# Patient Record
Sex: Female | Born: 1950
Health system: Southern US, Community
[De-identification: ages and names within clinical notes are randomized; demographics above are authoritative.]

## PROBLEM LIST (undated history)

## (undated) DIAGNOSIS — C801 Malignant (primary) neoplasm, unspecified: Secondary | ICD-10-CM

## (undated) DIAGNOSIS — I2510 Atherosclerotic heart disease of native coronary artery without angina pectoris: Secondary | ICD-10-CM

## (undated) DIAGNOSIS — Z9889 Other specified postprocedural states: Secondary | ICD-10-CM

## (undated) DIAGNOSIS — T7840XA Allergy, unspecified, initial encounter: Secondary | ICD-10-CM

## (undated) DIAGNOSIS — R112 Nausea with vomiting, unspecified: Secondary | ICD-10-CM

## (undated) DIAGNOSIS — Z9221 Personal history of antineoplastic chemotherapy: Secondary | ICD-10-CM

## (undated) DIAGNOSIS — I10 Essential (primary) hypertension: Secondary | ICD-10-CM

## (undated) DIAGNOSIS — R569 Unspecified convulsions: Secondary | ICD-10-CM

## (undated) DIAGNOSIS — Z923 Personal history of irradiation: Secondary | ICD-10-CM

## (undated) DIAGNOSIS — E039 Hypothyroidism, unspecified: Secondary | ICD-10-CM

## (undated) DIAGNOSIS — E785 Hyperlipidemia, unspecified: Secondary | ICD-10-CM

## (undated) DIAGNOSIS — C50919 Malignant neoplasm of unspecified site of unspecified female breast: Secondary | ICD-10-CM

## (undated) DIAGNOSIS — I219 Acute myocardial infarction, unspecified: Secondary | ICD-10-CM

## (undated) HISTORY — PX: MASTECTOMY: SHX3

## (undated) HISTORY — DX: Malignant neoplasm of unspecified site of unspecified female breast: C50.919

## (undated) HISTORY — DX: Acute myocardial infarction, unspecified: I21.9

## (undated) HISTORY — DX: Atherosclerotic heart disease of native coronary artery without angina pectoris: I25.10

## (undated) HISTORY — DX: Personal history of irradiation: Z92.3

## (undated) HISTORY — DX: Essential (primary) hypertension: I10

## (undated) HISTORY — DX: Hyperlipidemia, unspecified: E78.5

---
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
## 1996-10-17 HISTORY — PX: BREAST SURGERY: SHX581

## 1996-10-17 HISTORY — PX: ABDOMINAL HYSTERECTOMY: SHX81

## 1997-05-09 HISTORY — PX: OTHER SURGICAL HISTORY: SHX169

## 1999-01-18 ENCOUNTER — Encounter: Payer: Self-pay | Admitting: Obstetrics and Gynecology

## 1999-01-19 ENCOUNTER — Ambulatory Visit (HOSPITAL_COMMUNITY): Admission: RE | Admit: 1999-01-19 | Discharge: 1999-01-19 | Payer: Self-pay | Admitting: Obstetrics and Gynecology

## 1999-06-29 ENCOUNTER — Inpatient Hospital Stay (HOSPITAL_COMMUNITY): Admission: RE | Admit: 1999-06-29 | Discharge: 1999-07-01 | Payer: Self-pay | Admitting: Gynecology

## 1999-10-18 DIAGNOSIS — I2510 Atherosclerotic heart disease of native coronary artery without angina pectoris: Secondary | ICD-10-CM

## 1999-10-18 HISTORY — DX: Atherosclerotic heart disease of native coronary artery without angina pectoris: I25.10

## 2000-06-22 ENCOUNTER — Encounter: Payer: Self-pay | Admitting: Gynecology

## 2000-06-22 ENCOUNTER — Ambulatory Visit (HOSPITAL_COMMUNITY): Admission: RE | Admit: 2000-06-22 | Discharge: 2000-06-22 | Payer: Self-pay | Admitting: Gynecology

## 2000-07-14 ENCOUNTER — Encounter: Admission: RE | Admit: 2000-07-14 | Discharge: 2000-07-14 | Payer: Self-pay | Admitting: Family Medicine

## 2000-07-14 ENCOUNTER — Encounter: Payer: Self-pay | Admitting: Family Medicine

## 2000-09-16 DIAGNOSIS — I219 Acute myocardial infarction, unspecified: Secondary | ICD-10-CM

## 2000-09-16 HISTORY — DX: Acute myocardial infarction, unspecified: I21.9

## 2000-09-26 ENCOUNTER — Inpatient Hospital Stay (HOSPITAL_COMMUNITY): Admission: EM | Admit: 2000-09-26 | Discharge: 2000-09-29 | Payer: Self-pay | Admitting: Cardiology

## 2000-12-15 ENCOUNTER — Inpatient Hospital Stay (HOSPITAL_COMMUNITY): Admission: AD | Admit: 2000-12-15 | Discharge: 2000-12-16 | Payer: Self-pay | Admitting: Internal Medicine

## 2001-06-28 ENCOUNTER — Encounter: Payer: Self-pay | Admitting: Gynecology

## 2001-06-28 ENCOUNTER — Ambulatory Visit (HOSPITAL_COMMUNITY): Admission: RE | Admit: 2001-06-28 | Discharge: 2001-06-28 | Payer: Self-pay | Admitting: Gynecology

## 2001-07-12 ENCOUNTER — Other Ambulatory Visit: Admission: RE | Admit: 2001-07-12 | Discharge: 2001-07-12 | Payer: Self-pay | Admitting: Gynecology

## 2001-07-19 ENCOUNTER — Encounter: Payer: Self-pay | Admitting: Endocrinology

## 2001-07-19 ENCOUNTER — Ambulatory Visit (HOSPITAL_COMMUNITY): Admission: RE | Admit: 2001-07-19 | Discharge: 2001-07-19 | Payer: Self-pay | Admitting: Endocrinology

## 2002-01-24 ENCOUNTER — Encounter: Payer: Self-pay | Admitting: Emergency Medicine

## 2002-01-24 ENCOUNTER — Inpatient Hospital Stay (HOSPITAL_COMMUNITY): Admission: EM | Admit: 2002-01-24 | Discharge: 2002-01-25 | Payer: Self-pay | Admitting: Emergency Medicine

## 2002-07-10 ENCOUNTER — Encounter: Payer: Self-pay | Admitting: Gynecology

## 2002-07-10 ENCOUNTER — Ambulatory Visit (HOSPITAL_COMMUNITY): Admission: RE | Admit: 2002-07-10 | Discharge: 2002-07-10 | Payer: Self-pay | Admitting: Gynecology

## 2002-07-24 ENCOUNTER — Other Ambulatory Visit: Admission: RE | Admit: 2002-07-24 | Discharge: 2002-07-24 | Payer: Self-pay | Admitting: Gynecology

## 2003-07-14 ENCOUNTER — Encounter: Payer: Self-pay | Admitting: Gynecology

## 2003-07-14 ENCOUNTER — Ambulatory Visit (HOSPITAL_COMMUNITY): Admission: RE | Admit: 2003-07-14 | Discharge: 2003-07-14 | Payer: Self-pay | Admitting: Gynecology

## 2003-08-08 ENCOUNTER — Other Ambulatory Visit: Admission: RE | Admit: 2003-08-08 | Discharge: 2003-08-08 | Payer: Self-pay | Admitting: Gynecology

## 2003-12-21 ENCOUNTER — Inpatient Hospital Stay (HOSPITAL_COMMUNITY): Admission: EM | Admit: 2003-12-21 | Discharge: 2003-12-21 | Payer: Self-pay | Admitting: Emergency Medicine

## 2004-02-23 ENCOUNTER — Ambulatory Visit (HOSPITAL_COMMUNITY): Admission: RE | Admit: 2004-02-23 | Discharge: 2004-02-23 | Payer: Self-pay | Admitting: Unknown Physician Specialty

## 2004-07-14 ENCOUNTER — Ambulatory Visit (HOSPITAL_COMMUNITY): Admission: RE | Admit: 2004-07-14 | Discharge: 2004-07-14 | Payer: Self-pay | Admitting: Gynecology

## 2004-07-14 ENCOUNTER — Other Ambulatory Visit: Admission: RE | Admit: 2004-07-14 | Discharge: 2004-07-14 | Payer: Self-pay | Admitting: Gynecology

## 2004-08-17 ENCOUNTER — Ambulatory Visit: Payer: Self-pay | Admitting: Family Medicine

## 2005-03-07 ENCOUNTER — Ambulatory Visit: Payer: Self-pay | Admitting: Family Medicine

## 2005-08-09 ENCOUNTER — Ambulatory Visit: Payer: Self-pay | Admitting: Family Medicine

## 2005-09-15 ENCOUNTER — Ambulatory Visit (HOSPITAL_COMMUNITY): Admission: RE | Admit: 2005-09-15 | Discharge: 2005-09-15 | Payer: Self-pay | Admitting: Gynecology

## 2005-09-20 ENCOUNTER — Other Ambulatory Visit: Admission: RE | Admit: 2005-09-20 | Discharge: 2005-09-20 | Payer: Self-pay | Admitting: Gynecology

## 2006-09-21 ENCOUNTER — Ambulatory Visit (HOSPITAL_COMMUNITY): Admission: RE | Admit: 2006-09-21 | Discharge: 2006-09-21 | Payer: Self-pay | Admitting: Gynecology

## 2006-12-07 ENCOUNTER — Ambulatory Visit: Payer: Self-pay | Admitting: Family Medicine

## 2007-03-27 ENCOUNTER — Ambulatory Visit: Payer: Self-pay | Admitting: Family Medicine

## 2007-05-17 ENCOUNTER — Ambulatory Visit: Payer: Self-pay | Admitting: Gastroenterology

## 2007-05-31 ENCOUNTER — Encounter: Payer: Self-pay | Admitting: Gastroenterology

## 2007-05-31 ENCOUNTER — Ambulatory Visit: Payer: Self-pay | Admitting: Gastroenterology

## 2009-06-12 DIAGNOSIS — E039 Hypothyroidism, unspecified: Secondary | ICD-10-CM | POA: Insufficient documentation

## 2009-06-12 DIAGNOSIS — I10 Essential (primary) hypertension: Secondary | ICD-10-CM | POA: Insufficient documentation

## 2009-06-12 DIAGNOSIS — I2510 Atherosclerotic heart disease of native coronary artery without angina pectoris: Secondary | ICD-10-CM | POA: Insufficient documentation

## 2009-06-12 DIAGNOSIS — E785 Hyperlipidemia, unspecified: Secondary | ICD-10-CM | POA: Insufficient documentation

## 2009-06-12 DIAGNOSIS — R42 Dizziness and giddiness: Secondary | ICD-10-CM | POA: Insufficient documentation

## 2009-06-12 DIAGNOSIS — R002 Palpitations: Secondary | ICD-10-CM | POA: Insufficient documentation

## 2009-06-12 DIAGNOSIS — E782 Mixed hyperlipidemia: Secondary | ICD-10-CM | POA: Insufficient documentation

## 2011-03-04 NOTE — Discharge Summary (Signed)
[place]. [facility]  Patient:    [name] Riley, [name] Riley                      MRN: [id number] Adm. Date:  12/15/00 Disc. Date: 12/17/99 Attending:  Lewayne Bunting, M.D. Dictator:   Rozell Searing, P.A. CC:         Elvina Sidle, M.D.  [facility]   Discharge Summary  REASON FOR ADMISSION:  Please refer to dictated admission note.  LABORATORY DATA:  Cardiac enzymes show CPK-MB negative x 2.  Troponin I 0.01 x 2.  Normal metabolic panel.  Lipid profile shows cholesterol 145, triglycerides 85, HDL 36, LDL 92.  HOSPITAL COURSE:  Following transfer from [facility] where the patient presented with chest discomfort, she ruled out for MI with negative serial cardiac enzymes.  Of note, Dr. Lewayne Bunting, during his review of the case in [place], [place], had recommended a review of a 12-lead EKG by Dr. Nathen May regarding the question of possible brugada syndrome.  He has also recommended proceeding with an exercise stress test if the patient ruled out for a myocardial infarction.  Following overnight observation and negative serial cardiac enzymes, the patient was clear for discharge by Dr. Rollene Rotunda with whom the patient is scheduled to follow in late March.  Dr. Rollene Rotunda noted that he would arrange for an outpatient stress Cardiolite test.  DISCHARGE MEDICATIONS: 1. Altace 5 mg q.d. 2. Zocor 10 mg q.h.s. 3. Toprol XL 100 mg q.d. 4. Synthroid 0.1 mg q.d. 5. Ecotrin 325 mg q.d.  DISCHARGE INSTRUCTIONS:  The patient is scheduled to return to Dr. Rollene Rotunda later this month as previously scheduled.  Arrangements will be made for a outpatient stress test.  DISCHARGE DIAGNOSES: 1. Nonischemic chest pain.    a. Negative serial cardiac enzymes. 2. Coronary artery disease.    a. Status post myocardial infarction/ventricular fibrillation cardiac       arrest December 2001.    b. Preserved left ventricular function. 3.  Hypertension. 4. Dyslipidemia. 5. Treated hypothyroidism. 6. Hypokalemia. DD:  01/04/01 TD:  01/04/01 Job: [id number]

## 2011-03-04 NOTE — Cardiovascular Report (Signed)
[place]. [facility]  Patient:    [name] Riley, [name] Riley                       MRN: [id number] Proc. Date: 09/26/00 Adm. Date:  [id number] Attending:  Nelta Numbers CC:         Elvina Sidle, M.D.   Cardiac Catheterization  DATE OF BIRTH: 10-08-1951.  PROCEDURE:  Left heart catheterization/coronary arteriography.  INDICATIONS:  Evaluate patient with a non-Q-wave myocardial infarction.  PROCEDURE NOTE:  Left heart catheterization was performed via the right femoral artery.  The artery was cannulated using an anterior wall puncture. A 6-French arterial sheath was inserted via the modified Seldinger technique. Preformed Judkins and a pigtail catheter were utilized.  The patient tolerated the procedure well and left the lab in stable condition.  RESULTS:  HEMODYNAMICS:  LV 127/10, aortic pressure 127/70.  CORONARIES:  LEFT MAIN: The left main was normal.  LEFT ANTERIOR DESCENDING:  The LAD was normal in its proximal segment. However, in the mid segment there was a dissection plane with subsequent occlusion of the distal and apical LAD.  CIRCUMFLEX:  The circumflex was normal.  RIGHT CORONARY ARTERY:  The right coronary artery was a dominant normal vessel.  LEFT VENTRICULOGRAM:  The left ventriculogram was obtained in the RAO projection and the LAO projection.  The ejection fraction was approximately 55% with apical akinesis.  CONCLUSIONS:  Severe single vessel coronary artery disease with mid left anterior descending dissection.  PLAN:  We will discuss possibility of percutaneous revascularization of the LAD versus medical management. DD:  09/26/00 TD:  09/26/00 Job: [id number]

## 2011-03-04 NOTE — H&P (Signed)
NAME:  [name] Riley, [name] Riley                          ACCOUNT NO.:  [id number]   MEDICAL RECORD NO.:  [id number]                   PATIENT TYPE:  INP   LOCATION:  1830                                 FACILITY:  [facility]   PHYSICIAN:  Olga Millers, M.D.                DATE OF BIRTH:  [birth year]   DATE OF ADMISSION:  12/22/2003  DATE OF DISCHARGE:  12/21/2003                                HISTORY & PHYSICAL   PRIMARY CARDIOLOGIST:  Rollene Rotunda, M.D.   PRIMARY CARE PHYSICIAN:  Colon Flattery, D.O.   CHIEF COMPLAINT:  Chest pain, atypical.   HISTORY OF PRESENT ILLNESS:  A 60 year old female with a history of  hypertension, hyperlipidemia, coronary artery disease, status post mid LAD  dissection, and apical myocardial infarction with V-fib arrest (December  2001), with subsequent cardiac catheterization revealing a normal left  circumflex, normal left main, normal dominant right coronary artery, but a  mid LAD dissection with 100% occlusion of the vessel after the dissection  seen on catheterization.  Left ventriculogram with an ejection fraction of  55% and apical akinesis managed medically.  The patient had subsequently had  yearly presentations with atypical chest pain and subsequent exclusion of  myocardial infarction and subsequent negative Cardiolite stress test was  recent done in April of 2003 (Dr. Jens Som).  The patient also notes that in  addition to these two presentations (April 2003 and March, 2002).  The  patient also presented a couple of months ago to an outside hospital with  atypical chest pain that was a attributed to anxiety.  She had blood tests  done, but no stress test, and was subsequently sent home.  The patient also  notes that she has had a recent increase in her Pravachol to 40 mg p.o.  q.h.s. with improvement in her LDL to 77; however, LFT's are slightly up  making the patient more anxious than usual.  The patient's symptom that  brought her to the hospital today  was primary left-arm pain.  The arm pain  was present in the region of the humerus.  There were no aggravating or  relieving factors.  The pain would be transient 5/10 in severity, lasting  for minutes and seconds, occurring three to four times per day over the past  two days.  There were no accompanying symptoms of chest pain, shortness of  breath, nausea, vomiting or diaphoresis.  The patient has no pain currently.  The patient's husband added that anxiety is often a contributing factor to  the patient's atypical symptoms and thinks that it might be helpful if they  could talk to someone about possible anxiety treatment.  The patient has no  other complaints at this time.   PAST MEDICAL HISTORY:  1. IV contrast allergy.  2. Coronary artery disease, status post apical myocardial infarction with __     arrest December, 2001, with cardiac catheterization revealing a  mid LAD     dissection and subsequent 100% occlusion, left circumflex was normal.     The left main was normal.  Dominant right coronary artery was normal.     Left ventriculogram revealed an ejection fraction of 55% with apical     akinesis.  This lesions was managed medically.  3. Hypertension.  4. Hyperlipidemia with a history of Zocor, having elevated LFT's.  LFT's     normal on Pravachol 20 mg p.o. daily.  However, when Pravachol is     increased to 40 mg p.o. daily, there was a slight elevation in LFT's.  5. Hypothyroidism.  Maintained on Synthroid 75 micrograms p.o. daily.  6. History of palpitations, dizziness, anxiety, atypical chest pain, status     post exclusion of myocardial infarction and negative Cardiolite in March     of 2002, status post exclusion of myocardial infarction and negative     Cardiolite in April of 2003.  7. Status post hysterectomy, appendectomy and tonsillectomy.   SOCIAL HISTORY:  The patient lives in [place] with her husband.  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  The patient does own a treadmill, but does not  exercise on it regularly.  She does remain active at work and with yard work  as well as housework, but no formal exercise regimen.  She denies any  alcohol, tobacco or drug use in the past.   FAMILY HISTORY:  The patient's mother is alive at age 59 with chronic  cystitis.  The patient's father died at age 45 of lung cancer after heavy  tobacco use.  The patient has two brothers who are alive and well.  The  patient had three children in their 20's who are all healthy.   ALLERGIES/ADVERSE REACTIONS:  1. ZOCOR: Increased LFT's.  2. IV CONTRAST:  Caused itching, flushing and nausea.   MEDICATIONS:  1. Aspirin 81 mg p.o. daily.  2. Toprol XL 100 mg p.o. daily.  3. Altace 10 mg p.o. daily.  4. Pravachol 40 mg p.o. daily.  5. Synthroid 75 micrograms p.o. daily.  6. Multivitamin.  7. Vivelle 0.05 estrogen replacement patch, titrated down to lowest     tolerable dose with the assistance of her gynecologist to prevent hot     flashes.   PHYSICAL EXAMINATION:  VITAL SIGNS:  Temperature 98.4, heart rate 67,  respiratory rate 20, blood pressure 130/81, oxygen saturation 98% on room  air.  GENERAL:  The patient is pleasant, alert and cooperative.  She answers  questions appropriately.  HEENT:  She is normocephalic and atraumatic.  Her extraocular eye movements  are intact.  Her pupils are equal, round and reactive to light.  NECK:  Supple.  Carotid pulses are 2+ and symmetric bilaterally.  There is  no carotid bruits.  There is no jugular venous distension.  CARDIAC:  Exam reveals a regular S1 and S2.  There is no murmur.  LUNGS:  Fields are clear bilaterally.  SKIN:  Examination reveals no acute rash.  ABDOMEN:  Soft, nontender, nondistended with positive bowel sounds.  There  is no palpable hepatosplenomegaly. EXTREMITIES:  Examination reveals 2+ femoral pulses with no evidence of  bruits.   There is no lower-extremity edema.  NEUROLOGIC:  Brief neurological exam is grossly nonfocal.  There patient is  able to move all four extremities without difficulty.  Mentation is grossly  intact.   STUDIES:  EKG:  Normal sinus rhythm with a rate of 63, and no ischemic  changes, normal axis.  No evidence of hypertrophy.   LABORATORY DATA:  Hematocrit 38, sodium 141, potassium 3.5, chloride 104,  bicarbonate 27, BUN 15, glucose 122, CK-MB 1.1, less than 1.0.  Troponin I  less than 0.05 x2.  Myoglobin 35, 23.  The pH 7.39, pcO2 45.   ASSESSMENT/PLAN:  1. History of apical myocardial infarction secondary to mid left anterior     descending dissection, other coronaries normal with anxiety.  Atypical     chest pain, status post exclusion of myocardial infarction and negative     Cardiolite in 2002 and 2003.  We will exclude myocardial infarction with     serial cardiac markers on telemetry and serial EKG's.  We will make     arrangements for risk stratification with exercise stress test.  (For     example, treadmill stress echocardiogram).  We will continue aspirin 81     mg p.o. daily, Toprol XL 3 mg p.o. daily, Altace 10 mg p.o. daily, and     Pravachol 40 mg p.o. q.h.s. for history  of myocardial infarction.  2. Hyperlipidemia.  We will check lipid profile and liver function tests in     the morning.  If LFT's are significant elevated, continue reduction of     Pravachol back to 20 mg p.o. q.h.s. which was tolerated well with serial     liver function testing (per patient).  3. Hypertension.  Continue beta-blocker as described above.  Obtain goal     blood pressure of less than 135/85.  4. Hypothyroidism.  We will check TSH and free T4 to assure a euthyroid     state on the current dose of Synthroid.  5. Status post hysterectomy, on hormone replacement therapy.  The patient     reports that she had intolerable hot flashes accompanied by transient     episodes of hypertension when she  was not taking her estrogen patch.  She     reports that she had titrated the estrogen patch down to the lowest     tolerable dose with the recommendation of her gynecologist, to prevent     hot flashes, and transient episodes of hypertension.  6. Anxiety.  The patient and her husband expressed concern regarding anxiety     as a contributing factor to this and previous presentations with atypical     chest pain.  The patient reports being in a high-stress job.  She and her     husband express interest in a possible referral to talk to somebody about     treatment for anxiety.      Verne Grain, MD                      Olga Millers, M.D.    DDH/MEDQ  D:  12/21/2003  T:  12/21/2003  Job:  [id number]

## 2011-03-04 NOTE — Discharge Summary (Signed)
NAME:  CARLISHA, WISLER                          ACCOUNT NO.:  [id number]   MEDICAL RECORD NO.:  [id number]                   PATIENT TYPE:  INP   LOCATION:  1830                                 FACILITY:  [facility]   PHYSICIAN:  Chloe Miyoshi Dictator                    DATE OF BIRTH:  1951/09/19   DATE OF ADMISSION:  12/20/2003  DATE OF DISCHARGE:  12/21/2003                                 DISCHARGE SUMMARY   PRIMARY CAREGIVER:  Colon Flattery, D.O.   DISCHARGE DIAGNOSES:  1. Admitted on December 21, 2003, with left arm pain, a scale of 5/10, lasting     seconds to minutes three to four time for the last two days.  2. Admission troponin I studies negative x 3.  3. Electrocardiogram, nondiagnostic.   The bulk of the dictation will follow.      Maple Mirza, P.A.                    Kehinde Totzke Dictator    GM/MEDQ  D:  01/28/2004  T:  01/29/2004  Job:  [id number]   cc:   Rollene Rotunda, M.D.   Colon Flattery, D.O.  [address]  [place]  [name] [address]  Fax: [phone]

## 2011-03-04 NOTE — Discharge Summary (Signed)
[place]. [facility]  Patient:    [name] Riley, [name] Riley                       MRN: [id number] Adm. Date:  [id number] Disc. Date: 09/29/00 Attending:  Nelta Numbers Dictator:   Tereso Newcomer, P.A. CC:         Gretta Cool, M.D.  Elvina Sidle, M.D.   Discharge Summary  DATE OF BIRTH:  [birth year]  REASON FOR ADMISSION:  Apical myocardial infarction, status post sudden cardiac death/ventricular arrest in the field, revived with automated external defibrillator at local fire station.  DISCHARGE DIAGNOSES: 1. Single vessel coronary artery disease. 2. Minor apical wall motion abnormalities. 3. Left ventricular ejection fraction 55% by cardiac catheterization on    September 26, 2000. 4. History of IV dye allergy. 5. Hyperlipidemia. 6. Hypertension. 7. Status post hysterectomy.  PROCEDURES PERFORMED:  Cardiac catheterization on 09/26/00 revealing left main normal, LAD mid dissection with distal occlusion, circumflex normal, RCA normal and dominant, LVEF 55%, apical akinesis.  A 2-D echocardiogram performed on September 27, 2000, revealed LVEF of 60%, LV wall thickness mildly increased, trivial aortic valve regurgitation.  Echocardiogram reviewed by Dr. Antoine Poche with conclusion being there was only minor apical wall motion abnormalities.  HISTORY OF PRESENT ILLNESS:  This 60 year old female presented with ischemic chest pain and ventricular fibrillation arrest.  She was driven to her local fire station at the time of arrest.  She underwent fairly prompt defibrillation with AED.  Initially, she was combative.  Upon initial evaluation in the emergency room she was back to baseline.  The onset of her anterior chest pain was just prior to the ventricular fibrillation arrest. The pain that radiated to her left arm was associated with lightheadedness. She had similar symptoms a few days prior that resolved spontaneously.  She noted intermittent  palpitations in the past.  ALLERGIES:  IV dye.  PHYSICAL EXAMINATION:  GENERAL:  Initial physical exam revealed a pleasant, youthful, middle age female.  VITAL SIGNS:  Heart rate 100 and irregular.  Blood pressure 106/60. Respirations 20.  NECK:  Without bruits.  LUNGS:  There are a few coarse basilar rales.  CARDIAC:  Normal S1 and S2.  ABDOMEN:  Positive bowel sounds, soft, nontender.  EXTREMITIES:  Normal distal pulses.  NEUROLOGICAL:  Nonfocal.  LABORATORY DATA:  Electrocardiogram #1: Atrial fibrillation with rapid ventricular response, normal QTC, hyperacute anterior T-waves.  EKG #2 atrial fibrillation, controlled ventricular response, slight anterior ST elevation with T-wave inversions.  AED printout revealed pads on at 8:27 p.m., ventricular fibrillation defibrillated at 24S or 200 joules resulting in sinus bradycardia, intraventricular conduction delay and then sinus tachycardia at 2:1 AV block, then PVCs, then on _____ and then ventricular fibrillation at 40 seconds after shock.  The second shock was 90 seconds later at 200 joules, arrested briefly.  Ventricular fibrillation #3 shocked with 200 joules ineffective, 300 joules sinus rhythm and and then atrial fibrillation.  HOSPITAL COURSE:  The patient was admitted for acute myocardial infarction with ventricular fibrillation arrest.  Her cardiac enzymes were positive for MI.  Total CK#1 404, CK-MB 14.3, total CK #2 2932, CK-MB #2 15.6.  Troponin is 3.47.  The patients beta blocker was continued.  She was placed on heparin. She was taken for cardiac catheterization on September 26, 2000.  The results are noted above.  The procedure was performed by Dr. Rollene Rotunda.  The case was reviewed by Dr. Riley Kill.  Her apical MI and ventricular fibrillation were thought to be secondary to her LAD dissection.  Medical management was recommended.  She was been continued on beta blocker and aspirin.  Given her apical MI and  apical akinesis, she was originally started on Coumadin. Echocardiogram was checked, and it was noted that the apical wall motion abnormality was actually minor.  Therefore, no Coumadin therapy was warranted. This was discontinued prior to discharge.  The patient was noted to have an abnormal urinalysis with small hemoglobin, small leukocyte esterase, greater than 20 white cells, too numerous to count red cells and few bacteria.  Therefore, she was begun on Bactrim and Pyridium on September 28, 2000.  On admission, her potassium was noted to be borderline low at 2.7 and magnesium was 2.3.  She has urine, sodium and potassium performed.  Urine sodium was 101 and urine potassium was 69.  This was done because hypoaldosteronism needed to be considered.  There was some question upon initial presentation as to whether or not potassium was possibly a cause for her arrest.  Serum aldosterone level is still pending at the time of this dictation.  The patient was noted to have elevated cholesterol levels.  Total cholesterol was 206, LDL was 145, triglycerides 64, HDL 48.  Therefore, she was begun on Zocor prior to discharge.  She will need follow-up LFTs and lipid panel in six weeks.  On September 29, 2000, she was found to be in stable condition and ready for discharge to home.  Other labs:  White count 9000, hemoglobin 12.4 and hematocrit 34.2.  Platelet count 288,000.  INR 1.7.  Sodium 137, potassium 3.8, chloride 104, CO2 26, glucose 116, BUN 13, creatinine 0.7.  Total bilirubin 0.6, alkaline phosphatase 93, SGOT 66, SGPT 72, total protein 6.6, albumin 3.7, calcium 8.2, magnesium 1.7.  Urine culture on September 28, 2000, was greater than 100,000 of multiple species, probably contaminant.  The patient is to complete her antibiotic therapy and follow up with a urinalysis in two weeks at her appointment.  DISCHARGE MEDICATIONS:   1. Zocor 10 mg q.h.s.  2. Pyridium 100 mg t.i.d. until  finished.  3. Bactrim DS one p.o. b.i.d. until finished.  4. Altace 5 mg q.d.  5. Toprol XL 100 mg q.d.  6. Coated aspirin 325 mg q.d.  7. Estrogen patch.  8. Synthroid 0.1 mg q.d.  9. Nitroglycerin 0.4 mg sublingual p.r.n. chest pain. 10. Testosterone  cream as directed.  ACTIVITY:  No driving, heavy lifting, exertion or sex for one week.  No work until seen by Dr. Antoine Poche on October 11, 2000.  DIET:  Low fat, low salt.  WOUND CARE:  The patient is to watch the groin site for increased swelling or bruising and call with concerns.  FOLLOW-UP:  She is to have her cholesterol and liver enzymes checked in six weeks.   She is to have a urinalysis checked at her follow-up appointment with Dr. Antoine Poche.  Follow-up is with Dr. Lindaann Slough physician assistant on October 11, 2000, at 12:30 p.m. in [place]. DD:  09/29/00 TD:  09/29/00 Job: [id number]

## 2011-03-04 NOTE — Discharge Summary (Signed)
NAME:  [name] Riley, [name] Riley                          ACCOUNT NO.:  [id number]   MEDICAL RECORD NO.:  [id number]                   PATIENT TYPE:  INP   LOCATION:  1830                                 FACILITY:  [facility]   PHYSICIAN:  Rollene Rotunda, M.D.                DATE OF BIRTH:  July 29, 1951   DATE OF ADMISSION:  12/20/2003  DATE OF DISCHARGE:  12/21/2003                                 DISCHARGE SUMMARY   CONTINUATION:   DISCHARGE DIAGNOSES:  1. Admission with left arm pain, severity 5/10, lasting seconds to minutes,     occurring 3-4 times a day the last 2 days.  2. Troponin I studies negative rule-out for myocardial infarction.  3. Electrocardiogram nondiagnostic.   SECONDARY DIAGNOSES:  1. Intravenous contrast allergy.  2. History of coronary artery disease, status post apical myocardial     infarction with cardiac arrest, December 2001.  3. Left heart catheterization reveals mid left anterior descending     dissection with subsequent 100% occlusion, normal left circumflex, normal     left main, normal dominant right coronary artery, ejection fraction of     55% with apical akinesis, medical management.  4. Hypertension.  5. Hyperlipidemia with elevated liver function tests on Zocor, now on     Pravachol.  6. Hypothyroidism.  7. History of palpitations, dizziness, anxiety and atypical chest pain.     Negative Cardiolite study, March 2002, status post exclusion of     myocardial infarction and negative Cardiolite, April 2003.  8. Status post hysterectomy, appendectomy, tonsillectomy.   PROCEDURE:  Rule out for myocardial infarction.   DISCHARGE DISPOSITION:  [name] Riley is discharged December 21, 2003 after  cycling cardiac enzymes.  She also had a fasting lipid profile done; the  study is as follows:  Cholesterol 165, triglycerides 82, HDL cholesterol 37,  LDL cholesterol 112.  TSH was 3.696, T4 was 9.5.  The patient was also  maintained on Ativan 10 mg orally every 8 hours  and prescribed Xanax as an  outpatient.  She has had no further chest pain, this hospitalization, after  admission.  She has had no cardiac dysrhythmias or respiratory compromise.  She will be scheduled for a Cardiolite study, January 23, 2004, and a followup  appointment with Dr. Rollene Rotunda scheduled after the study is finished.   DISCHARGE MEDICATIONS:  She is discharged on the following medications:  1. Aspirin 81 mg daily.  2. Toprol-XL 100 mg daily.  3. Altace 10 mg daily.  4. Pravachol 40 mg daily at bedtime.  5. Synthroid 75 mcg daily.  6. Multivitamin daily.  7. Vivelle 0.05 estrogen replacement patch at lowest tolerable dose.  8. Xanax 0.25 mg every 8 hours as needed for anxiety.  9. Nitroglycerin 0.4 mg 1 tab sublingually every 5 minutes x3 doses as     needed for chest pain.   BRIEF HISTORY:  [name] Riley  is a 60 year old female with a history of  hypertension, hyperlipidemia, coronary artery disease.  She is status post  left heart catheterization in December 2001, concurrent with cardiac arrest,  which showed mid LAD dissection and apical myocardial infarction.  Catheterization also revealed a normal left circumflex, normal left main and  normal dominant right coronary artery.  Left ventriculogram showed ejection  fraction of 55% and apical akinesis.  The patient has been managed  medically.  Subsequently, the patient has had yearly presentations with  atypical chest pain and has been subsequently ruled out for myocardial  infarctions and has had 2 negative stress Cardiolite studies done, the  first, March 2002, and then April 2003.  A couple of months ago, the patient  presented to an outside hospital with atypical chest pain (attributed to  anxiety).  She had blood tests done but no stress test and was subsequently  sent home.  The patient notes that she has had a recent increase in her  Pravachol to 40 mg at night with improvement in her LDL, however, her liver   function studies are slightly up, making the patient more anxious than  usual.  Today, the patient came to the hospital with primarily left arm  pain; it is present in the region of the humerus; there are no aggravating  or relieving factors; the pain is transient, 5/10 in severity; it lasts from  seconds to minutes; occurs 3-4 times a day over the last 48 hours.  There  are no symptoms of chest pain, shortness of breath, nausea, vomiting or  diaphoresis.  At the time of this examination, the patient is pain-free.  The patient's husband added that anxiety is often a contributing factor to  patient's atypical symptoms and thinks it might be helpful if they could  talk to someone about possible anxiety treatment.  In this last regard  concerning anxiety, the patient was referred to Dr. Onalee Hua L. Gutterman by  Dr. Antoine Poche; in addition, she is to follow with Dr. Colon Flattery and as  mentioned above, will be sent home on Xanax 0.25 mg every 8 hours as needed  for a 72-month prescription.   HOSPITAL COURSE:  After admission to [facility] through the  emergency room for atypical chest pain and having been ruled out for  myocardial infarction, the patient was seen the following morning by Dr.  Antoine Poche.  The patient was pain-free and enzymes x3 were negative.  The  patient was discharged later the same day at about 1:30 with a followup  outpatient Cardiolite study and followup also with Dr. Antoine Poche.      [name] Riley, P.A.                    Rollene Rotunda, M.D.    GM/MEDQ  D:  01/28/2004  T:  01/29/2004  Job:  [id number]

## 2011-03-04 NOTE — H&P (Signed)
[place]  Patient:    [name] Riley, [name] Riley Visit Number: [id number] MRN: [id number]          Service Type: MED Location: [phone] Attending Physician:  Rollene Rotunda Dictated by:   Madolyn Frieze. Jens Som, M.D. [facility] Admit Date:  01/24/2002                           History and Physical  HISTORY OF PRESENT ILLNESS:  The patient is a 60 year old female with a past medical history of coronary artery disease, hypertension, hyperlipidemia and hypothyroidism, who presents with palpitations and vague chest pain.  The patients cardiac history dates back to December of 2001.  At that time, she had an apical myocardial infarction complicated by ventricular fibrillation at rest.  She underwent cardiac catheterization that showed a normal left main. The mid-LAD had a dissection plane with subsequent total occlusion.  The circumflex was normal, as was the right coronary artery, which was dominant. Her ejection fraction was 55% with apical akinesis.  Of note, medical management was suggested at that time.  Since then, she has done reasonably well.  She denies any history of exertional chest pain and she does exercise routinely.  There is no dyspnea on exertion, orthopnea or PND.  The patient states that her blood pressure medications have been changed recently, however, her blood pressure has increased and she has also had episodes of palpitations.  She describes a "pounding sensation in her chest" that lasts for approximately 15 minutes.  This typically occurs when her blood pressure is elevated and she had an episode last evening.  There is associated dizziness but no frank syncope.  There is a feeling in her chest as if "she needs to belch."  There is no associated nausea, vomiting or short of breath. She had two subsequent episodes today and therefore was brought to the emergency room.  She is presently asymptomatic.  Because of these symptoms, we were asked  to further evaluation.  PAST MEDICAL HISTORY: 1. Toprol 100 mg p.o. q.d. 2. Altace 5 mg p.o. q.d. 3. Pravachol 20 mg p.o. q.d. 4. Synthroid. 5. Aspirin 81 mg p.o. q.d. 6. Vitamin E. 7. Multivitamin. 8. Sublingual nitroglycerin p.r.n.  ALLERGIES:  She is allergic to [facility] as it did increase her liver functions; she also has an allergy to IVP DYE.  SOCIAL HISTORY:  She does not smoke nor does she consume alcohol.  PAST MEDICAL HISTORY:  Her past medical history is significant for coronary artery disease as outlined above; she also has hypertension and hyperlipidemia but no diabetes mellitus.  She has a history of hypothyroidism.  She is status post hysterectomy as well as appendectomy.  She has a history of tonsillectomy.  FAMILY HISTORY:  Positive for coronary artery disease.  REVIEW OF SYSTEMS:  She denies any headaches or fever or chills.  There is no productive cough or hemoptysis.  There is no dysphagia, odynophagia, melena or hematochezia.  There is no dysuria or hematuria.  There is no rash or seizure activity.  There is no orthopnea, PND or pedal edema.  The remaining symptoms are negative.  PHYSICAL EXAMINATION:  VITAL SIGNS:  Physical exam today shows a blood pressure of 130/80 and her pulse is 78.  She is afebrile.  GENERAL:  She is well-developed and well-nourished, in no acute distress.  SKIN:  Warm and dry.  HEENT:  Unremarkable with normal eyelids.  NECK:  Supple  with normal upstrokes bilaterally and there are no bruits noted. There is no jugular venous distention nor thyromegaly noted.  CHEST:  Clear to auscultation with normal expansion.  CARDIOVASCULAR:  Regular rate and rhythm with normal S1 and S2.  There are no murmurs, rubs, or gallops noted.  ABDOMEN:  Not tender or distended.  Positive bowel sounds.  No hepatosplenomegaly and no mass appreciated.  There is no abdominal bruit.  She has 2+ femoral pulses bilaterally and no  bruits.  EXTREMITIES:  Her extremities show no edema and I can palpate no cords.  She has 2+ posterior tibial pulses bilaterally.  NEUROLOGIC:  Exam is grossly intact.  LABORATORY AND ACCESSORY DATA:  Laboratories show a sodium of 144 with a BUN and creatinine of 9 and 0.6.  Her hemoglobin and hematocrit are 13.6 and 38.8. Her white blood cell count is 5.9 with a platelet count of 269,000.  Her enzymes are negative.  Her electrocardiogram shows no acute ST changes.  DIAGNOSES: 1. Palpitations. 2. Vague atypical chest pain. 3. History of coronary artery disease, status post myocardial infarction    complicated by ventricular fibrillation arrest. 4. Hypertension. 5. Hyperlipidemia. 6. Hypothyroidism.  PLAN:  The patient presents with complaints of palpitations associated with a vague chest pain.  The chest pain is extremely atypical.  We will admit and rule out myocardial infarction with serial enzymes.  If they are negative, then she can proceed with an outpatient Cardiolite.  She is also complaining of palpitations.  We will follow her on telemetry for any type of arrhythmia. If there is no further rhythm disturbance, then we will plan an outpatient event monitor to more fully assess.  We will continue with her aspirin as well as her Toprol and Altace.  The Altace will be increased for better blood pressure control.  She will be discharged in the morning if the above is negative. Dictated by:   Madolyn Frieze. Jens Som, M.D. [facility] Attending Physician:  Rollene Rotunda DD:  01/24/02 TD:  01/25/02 Job: [id number]

## 2011-03-04 NOTE — Discharge Summary (Signed)
[place]  Patient:    [name] Riley, [name] Riley Visit Number: [id number] MRN: [id number]          Service Type: MED Location: [phone] Attending Physician:  Rollene Rotunda Dictated by:   Brita Romp, P.A.-C Admit Date:  01/24/2002 Disc. Date: 01/25/02   CC:         Colon Flattery, D.O.   Discharge Summary  DISCHARGE DIAGNOSES: 1. Hypertension. 2. Palpitations. 3. Hyperlipidemia. 4. Hypothyroidism. 5. Past history of myocardial infarction in December 2001, with ventricular    fibrillation arrest.  HOSPITAL COURSE:  [name] Riley is a 60 year old female with known coronary artery disease.  She was seen on the day prior to admission in her primary care physicians office where her diastolic blood pressure was noted to be greater than 100.  Rechecking approximately one hour later showed a normalized diastolic pressure.  That evening, she felt feelings of palpitations and very mild chest pressure.  On the day of admission, she had two episodes of palpitations as previously described.  She called EMS, and was brought to the hospital at [facility].  Blood pressure on admission was 160/108.  Note, she apparently had similar symptoms in October 2000, which she had been evaluated at [place] and was thought to have been related to anxiety.  The patient was seen and admitted by Dr. Olga Millers.  He doubted that her symptoms were actually of ischemic etiology.  However, given her history, he planned to admit her and rule out for myocardial infarction.  The following day, the patient was doing well, had no further complaints.  She was seen by Dr. Antoine Poche who noted that her cardiac enzymes were serially negative, and felt she was stable for discharge.  He also planned to schedule an outpatient stress test as well as a patient event monitor.  He felt that pheochromocytoma was not likely, but he felt that it should be kept in  the differential diagnoses.  DISCHARGE MEDICATIONS: 1. Toprol XL 100 mg q.d. 2. Altace 10 mg q.d. 3. Pravachol 20 mg q.h.s. 4. Enteric-coated aspirin 81 mg q.d. 5. Synthroid 0.100 mg q.d. 6. Vitamin E and multivitamin as previously taken.  LABORATORY DATA:  Sodium 144, potassium 3.8, chloride 106, CO2 33, BUN 9, creatinine 0.6, glucose 94.  Albumin 4.2, AST 29, ALT 41, alkaline phosphatase 87, total bilirubin 0.4.  White count 5.9, hemoglobin 13.6, hematocrit 38.8, platelets 269.  Serial cardiac enzymes were negative for myocardial infarction.  Electrocardiogram shows a normal sinus rhythm at 74.  PR interval 150, QRS 83, QTC 47, axis 48.  ACTIVITY:  The patient is to return to a normal level of activity.  DIET:  Follow a low fat, low cholesterol diet.  FOLLOWUP: 1. She is to have an exercise rest stress Cardiolite examination on January 29, 2002, at 1:30 p.m. at the [place] office in [place]. 2. She is to pick up an event monitor on the same day. 3. She is to follow up with Dr. Antoine Poche on February 13, 2002, in the [place]    office at 2:30 p.m. 4. She is to keep her scheduled appointment with Dr. Dewaine Conger. Dictated by:   Brita Romp, P.A.-C Attending Physician:  Rollene Rotunda DD:  01/25/02 TD:  01/26/02 Job: [id number]

## 2011-08-03 ENCOUNTER — Other Ambulatory Visit: Payer: Self-pay | Admitting: Gynecology

## 2011-08-03 DIAGNOSIS — N6320 Unspecified lump in the left breast, unspecified quadrant: Secondary | ICD-10-CM

## 2011-08-15 ENCOUNTER — Ambulatory Visit
Admission: RE | Admit: 2011-08-15 | Discharge: 2011-08-15 | Disposition: A | Discharge status: Home / Self Care | Payer: No Typology Code available for payment source | Source: Ambulatory Visit | Attending: Gynecology | Admitting: Gynecology

## 2011-08-15 ENCOUNTER — Other Ambulatory Visit: Payer: Self-pay | Admitting: Gynecology

## 2011-08-15 DIAGNOSIS — N6320 Unspecified lump in the left breast, unspecified quadrant: Secondary | ICD-10-CM

## 2011-08-17 ENCOUNTER — Other Ambulatory Visit: Payer: Self-pay | Admitting: Obstetrics and Gynecology

## 2011-08-17 DIAGNOSIS — N63 Unspecified lump in unspecified breast: Secondary | ICD-10-CM

## 2011-08-23 ENCOUNTER — Ambulatory Visit (INDEPENDENT_AMBULATORY_CARE_PROVIDER_SITE_OTHER): Payer: Medicaid Other | Admitting: *Deleted

## 2011-08-23 ENCOUNTER — Encounter: Payer: Self-pay | Admitting: *Deleted

## 2011-08-23 VITALS — BP 169/88 | HR 88 | Temp 98.0°F | Resp 14 | Ht 68.0 in | Wt 185.0 lb

## 2011-08-23 DIAGNOSIS — N63 Unspecified lump in unspecified breast: Secondary | ICD-10-CM

## 2011-08-23 DIAGNOSIS — Z1239 Encounter for other screening for malignant neoplasm of breast: Secondary | ICD-10-CM

## 2011-08-23 DIAGNOSIS — N6320 Unspecified lump in the left breast, unspecified quadrant: Secondary | ICD-10-CM

## 2011-08-23 NOTE — Progress Notes (Signed)
Complaints of Left Breast lump.  Pap Smear:    Pap smear not performed today due to patient refused. Lat Pap smear was around 2006-2007 by Dr. Nicholas Lose. Patient has a history of hysterectomy. No history of abnormal Pap smears per patient.  Physical exam: Breasts Breasts symmetrical. No skin abnormalities bilateral breasts. Nipple retraction bilateral breasts per patient is normal. No nipple discharge bilateral breasts. No lymphadenopathy. No lumps palpated in right breast. Lump palpated in left breast at 3 o'clock around 6 cm from the nipple. Lump measured about 3 cm in width and about 2 cm in length. No complaints of pain on palpation.         Pelvic/Bimanual No Pap smear completed today due to patient refused. Patient has a history of a hysterectomy and per patient no history of abnormal Pap smears. No results in EPIC.

## 2011-08-23 NOTE — Patient Instructions (Signed)
Taught patient how to perform BSE and gave educational materials to take home. Patient refused Pap smear today. Told patient about free cervical cancer screenings to receive a Pap smear if would like one to attend one of our free screenings. Gave patient upcoming dates. Patient is scheduled for a left breast biopsy Thursday, November 8 th at 0800. Patient aware of appointment and will be there. Let patient know will follow up with her within the next couple weeks with results. Patient verbalized understanding.

## 2011-08-25 ENCOUNTER — Ambulatory Visit
Admission: RE | Admit: 2011-08-25 | Discharge: 2011-08-25 | Disposition: A | Discharge status: Home / Self Care | Payer: No Typology Code available for payment source | Source: Ambulatory Visit | Attending: Obstetrics and Gynecology | Admitting: Obstetrics and Gynecology

## 2011-08-25 ENCOUNTER — Other Ambulatory Visit: Payer: Self-pay | Admitting: Obstetrics and Gynecology

## 2011-08-25 DIAGNOSIS — N63 Unspecified lump in unspecified breast: Secondary | ICD-10-CM

## 2011-08-25 DIAGNOSIS — C50919 Malignant neoplasm of unspecified site of unspecified female breast: Secondary | ICD-10-CM | POA: Insufficient documentation

## 2011-08-25 HISTORY — DX: Malignant neoplasm of unspecified site of unspecified female breast: C50.919

## 2011-08-26 ENCOUNTER — Other Ambulatory Visit: Payer: Self-pay | Admitting: Obstetrics and Gynecology

## 2011-08-26 ENCOUNTER — Other Ambulatory Visit: Payer: Self-pay | Admitting: Gynecology

## 2011-08-26 ENCOUNTER — Other Ambulatory Visit: Payer: No Typology Code available for payment source

## 2011-08-26 ENCOUNTER — Telehealth: Payer: Self-pay | Admitting: *Deleted

## 2011-08-26 DIAGNOSIS — C50912 Malignant neoplasm of unspecified site of left female breast: Secondary | ICD-10-CM

## 2011-08-26 NOTE — Telephone Encounter (Signed)
Patient's spouse called and left voicemail for me to call back. Called patient back. [facility] and needed follow up. Patient had questions about if [employer] covers the treatment and what it covers. Explained to patient that will work on getting her [employer] Medicaid. If patient is not eligible for [employer] Medicaid or some other type of Medicaid will refer patient to a financial counselor to help her will her bills since has no insurance. Let her know will initiate her [employer] Medicaid paperwork and Silvio Pate will get in touch with her Monday to get any missing information and set up a time for her to come sign. Patient verbalized understanding.

## 2011-08-29 ENCOUNTER — Telehealth: Payer: Self-pay | Admitting: *Deleted

## 2011-08-29 ENCOUNTER — Other Ambulatory Visit: Payer: Self-pay | Admitting: Gynecology

## 2011-08-29 NOTE — Telephone Encounter (Signed)
Spoke with patient via phone concerning B[employer]-MCD application.  States that she would rather have her husband with her when she completes the application.  Patient and husband is scheduled to come in on Wednesday Nov. 14 at 1 pm.  Patient verbalized understanding.

## 2011-08-30 ENCOUNTER — Ambulatory Visit (HOSPITAL_COMMUNITY)
Admission: RE | Admit: 2011-08-30 | Discharge: 2011-08-30 | Disposition: A | Discharge status: Home / Self Care | Payer: Medicaid Other | Source: Ambulatory Visit | Attending: Gynecology | Admitting: Gynecology

## 2011-08-30 DIAGNOSIS — N63 Unspecified lump in unspecified breast: Secondary | ICD-10-CM | POA: Insufficient documentation

## 2011-08-30 DIAGNOSIS — C50419 Malignant neoplasm of upper-outer quadrant of unspecified female breast: Secondary | ICD-10-CM | POA: Insufficient documentation

## 2011-08-30 DIAGNOSIS — C773 Secondary and unspecified malignant neoplasm of axilla and upper limb lymph nodes: Secondary | ICD-10-CM | POA: Insufficient documentation

## 2011-08-30 DIAGNOSIS — C50912 Malignant neoplasm of unspecified site of left female breast: Secondary | ICD-10-CM

## 2011-08-30 LAB — CREATININE, SERUM
Creatinine, Ser: 0.52 mg/dL (ref 0.50–1.10)
GFR calc Af Amer: >90 mL/min (ref >90)
GFR calc non Af Amer: >90 mL/min (ref >90)

## 2011-08-30 MED ORDER — GADOBENATE DIMEGLUMINE 529 MG/ML IV SOLN
20.0000 mL | Freq: Once | INTRAVENOUS | Status: AC | PRN
  Administered 2011-08-30: 17 mL via INTRAVENOUS

## 2011-08-31 ENCOUNTER — Encounter (INDEPENDENT_AMBULATORY_CARE_PROVIDER_SITE_OTHER): Payer: Self-pay | Admitting: Surgery

## 2011-08-31 ENCOUNTER — Telehealth: Payer: Self-pay | Admitting: *Deleted

## 2011-08-31 NOTE — Telephone Encounter (Signed)
Pt in today to complete and sign BCCCP-MCD application.

## 2011-09-01 ENCOUNTER — Encounter (INDEPENDENT_AMBULATORY_CARE_PROVIDER_SITE_OTHER): Payer: Self-pay | Admitting: Surgery

## 2011-09-01 ENCOUNTER — Ambulatory Visit (INDEPENDENT_AMBULATORY_CARE_PROVIDER_SITE_OTHER): Payer: PRIVATE HEALTH INSURANCE | Admitting: Surgery

## 2011-09-01 VITALS — BP 158/102 | HR 72 | Temp 98.4°F | Resp 16 | Ht 68.0 in | Wt 184.8 lb

## 2011-09-01 DIAGNOSIS — C50919 Malignant neoplasm of unspecified site of unspecified female breast: Secondary | ICD-10-CM

## 2011-09-01 DIAGNOSIS — C50912 Malignant neoplasm of unspecified site of left female breast: Secondary | ICD-10-CM

## 2011-09-01 DIAGNOSIS — C50412 Malignant neoplasm of upper-outer quadrant of left female breast: Secondary | ICD-10-CM | POA: Insufficient documentation

## 2011-09-01 NOTE — Progress Notes (Signed)
Chief Complaint  Patient presents with  . Breast Cancer    HPI [name] Riley is a 60 y.o. female.  The patient presents with a four-month history of a palpable mass in her left lower outer quadrant of her breast. She underwent mammogram and ultrasound. Subsequently she underwent ultrasound-guided biopsy of the mass as well as a palpable lymph node. 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 The prognostic profile is pending. The lymph node showed metastatic carcinoma. On November 13, she underwent breast MRI. This confirmed the 3.5 x 2.7 x 1.7 invasive ductal carcinoma at 3:00 and left breast. She does have left axillary lymphadenopathy. She also has several other abnormally enhancing left breast masses including some thickening of the skin with distraction of the nipple. The right breast shows several areas of suspicious abnormal enhancement. She also has some abnormal findings in the sternum and the manubrium. She is referred today for surgical evaluation. She has an appointment next week with Dr. Pierce Crane for oncology.HPI  Past Medical History  Diagnosis Date  . Coronary artery disease   . Heart attack 10-05-00    pt coded during this process  . Hyperlipidemia   . Hypertension     Past Surgical History  Procedure Date  . Abdominal hysterectomy     1998  . Tonsillectomy   . Ovarian cyst surgery 1970  . Skin tags 05/09/1997    left axillary left neck skin tags  . Breast surgery 1998    removal of benign lump in rt breast    Family History  Problem Relation Age of Onset  . Hypertension Maternal Grandmother   . Diabetes Maternal Grandmother   . Cancer Father 79    lung cancer  . Hypertension Mother   . Cancer Paternal Aunt     ovarian    Social History History  Substance Use Topics  . Smoking status: Never Smoker   . Smokeless tobacco: Never Used  . Alcohol Use: No    Allergies  Allergen Reactions  . Contrast  Media (Iodinated Diagnostic Agents) Rash    Current Outpatient Prescriptions  Medication Sig Dispense Refill  . aspirin 81 MG tablet Take 81 mg by mouth daily.        . fish oil-omega-3 fatty acids 1000 MG capsule Take 2 g by mouth daily.        Marland Kitchen levothyroxine (SYNTHROID, LEVOTHROID) 75 MCG tablet Take 75 mcg by mouth daily.        . Multiple Vitamins-Minerals (MULTIVITAMIN WITH MINERALS) tablet Take 1 tablet by mouth daily.        . nebivolol (BYSTOLIC) 10 MG tablet Take 10 mg by mouth daily.          Review of Systems Review of Systems  Constitutional: Positive for chills. Negative for fever and unexpected weight change.  HENT: Positive for congestion and voice change. Negative for hearing loss, sore throat and trouble swallowing.   Eyes: Negative for visual disturbance.  Respiratory: Positive for wheezing. Negative for cough.   Cardiovascular: Negative for chest pain, palpitations and leg swelling.  Gastrointestinal: Negative for nausea, vomiting, abdominal pain, diarrhea, constipation, blood in stool, abdominal distention and anal bleeding.  Genitourinary: Negative for hematuria, vaginal bleeding and difficulty urinating.  Musculoskeletal: Negative for arthralgias.  Skin: Negative for rash and wound.  Neurological: Positive for headaches. Negative for seizures and syncope.  Hematological: Negative for adenopathy. Does not bruise/bleed easily.  Psychiatric/Behavioral: Negative for confusion.  Menarche - 69 First pregnancy - 22 Breast feeding - no Hormones - several years Menopause - at the time of hysterectomy  Blood pressure 158/102, pulse 72, temperature 98.4 F (36.9 C), temperature source Temporal, resp. rate 16, height 5\' 8"  (1.727 m), weight 184 lb 12.8 oz (83.825 kg).  Physical Exam Physical Exam WDWN in NAD HEENT:  EOMI, sclera anicteric Neck:  No masses, no thyromegaly Lungs:  CTA bilaterally; normal respiratory effort; no wheezing noted Breasts - slight nipple  retraction laterally in the left breast, no right nipple retraction No dominant masses in the right breast; no axillary lymphadenopathy Left breast - lower outer quadrant - large 3-4 cm palpable firm mass, fixated to overlying skin; palpable lymph node in the axilla CV:  Regular rate and rhythm; no murmurs Abd:  +bowel sounds, soft, non-tender, no masses Ext:  Well-perfused; no edema Skin:  Warm, dry; no sign of jaundice  Data Reviewed Mammogram/ ultrasound/ MRI  Assessment    Metastatic breast cancer - left breast with positive axillary lymph node.  Incompletely staged - if metastatic work-up is negative, then she will need further core biopsies of the right breast.    Plan    She has an appointment to see Dr. Donnie Coffin next week to discuss neoadjuvant therapy.  I went ahead and counseled her regarding port placement.  The surgical procedure has been discussed with the patient.  Potential risks, benefits, alternative treatments, and expected outcomes have been explained.  All of the patient's questions at this time have been answered.  The likelihood of reaching the patient's treatment goal is good.  The patient understand the proposed surgical procedure and wishes to proceed.  I spent a considerable amount of time (60 min) with the patient and her husband discussing the MRI findings and expected course of treatment.  They are obviously quite shocked with the possibility that she might have bilateral multicentric disease with possible bone metastases.  They understand the need for neoadjuvant therapy before planning any type of surgical resection.       [name] Riley K. 09/01/2011, 12:49 PM

## 2011-09-01 NOTE — Patient Instructions (Signed)
Our schedulers will call you to schedule your port placement.  Call us at [phone] if you have any questions.

## 2011-09-07 ENCOUNTER — Ambulatory Visit (HOSPITAL_BASED_OUTPATIENT_CLINIC_OR_DEPARTMENT_OTHER): Payer: Medicaid Other | Admitting: Oncology

## 2011-09-07 ENCOUNTER — Other Ambulatory Visit: Payer: Self-pay

## 2011-09-07 ENCOUNTER — Encounter (INDEPENDENT_AMBULATORY_CARE_PROVIDER_SITE_OTHER): Payer: Self-pay | Admitting: Surgery

## 2011-09-07 ENCOUNTER — Ambulatory Visit: Payer: Self-pay

## 2011-09-07 ENCOUNTER — Telehealth: Payer: Self-pay | Admitting: Oncology

## 2011-09-07 VITALS — BP 178/97 | HR 98 | Temp 98.0°F | Ht 68.0 in | Wt 184.0 lb

## 2011-09-07 DIAGNOSIS — C50919 Malignant neoplasm of unspecified site of unspecified female breast: Secondary | ICD-10-CM

## 2011-09-07 DIAGNOSIS — Z170 Estrogen receptor positive status [ER+]: Secondary | ICD-10-CM

## 2011-09-07 NOTE — Telephone Encounter (Signed)
Made a note °

## 2011-09-09 ENCOUNTER — Telehealth: Payer: Self-pay | Admitting: *Deleted

## 2011-09-09 ENCOUNTER — Encounter (HOSPITAL_COMMUNITY): Payer: Self-pay | Admitting: Pharmacy Technician

## 2011-09-09 NOTE — Telephone Encounter (Signed)
CALLED PATIENT AT HOME NUMBER AND INFORMED THE PATIENT OF THE NEW DATE AND TIME OF THE PET SCAN APPOINTMENT AND GAVE TO THE PATIENT INFORMATION ON COMING IN AND SEEING DR.RUBIN ON 09-22-2011 AT 11:00AM PATIENT CONFIRMED OVER THE PHONE

## 2011-09-10 ENCOUNTER — Other Ambulatory Visit: Payer: Self-pay | Admitting: Oncology

## 2011-09-10 DIAGNOSIS — C50912 Malignant neoplasm of unspecified site of left female breast: Secondary | ICD-10-CM

## 2011-09-10 NOTE — Progress Notes (Signed)
err

## 2011-09-10 NOTE — Progress Notes (Signed)
Patient History and Physical   [name] Riley [id number] [birth year] 60 y.o. 09/10/2011  CC: Dr Jearld Lesch Dr Gwyndolyn Kaufman Dr Shela Commons hochrein  Chief Complaint: Breast cancer  HPI:  This is a 60 yo woman from [name], [name], here with her husband for evaluation and discussion of her recent diagnosis of BREAST CANCER. She has been in previous good health. She self detected a left sided breast mass in aug. 2012. She sought medical attention for this in October 2012. Her last mammogram was in 2007. The mammogram suggested a suspicious 1.2 x 1.8 x 2.1 cm irregular mass in the outer left breast with suspicious 1 x 1.6 cm left axillary lymph node. Biopsy of both the mass and lymph node on 08/25/11 revealed invasive ductal cancer , grade III, with lymphovascular involvement. The tumor was ER/PR + at 99 and 81% respectively with a her 2 ration of 1.54. An MRI of both breasts performed on 08/30/11, revealed multiple abnormalities; These are detailed below. Essentially bilateral breast abnormalities were seen, with findings suspicious for metastatic disease. She has been seen by Dr [name] and a port has been discussed.  PMH: Past Medical History  Diagnosis Date  . Coronary artery disease   . Heart attack October 21, 2000    pt coded during this process, no CAD seen on cath.  . Hyperlipidemia   . Hypertension     Past Surgical History  Procedure Date  . Abdominal hysterectomy and oophorectomy     1998  . Tonsillectomy   . Ovarian cyst surgery/appendectomy  1970  . Skin tags 05/09/1997    left axillary left neck skin tags  . Breast surgery 1998    removal of benign lump in rt breast    Allergies: Allergies  Allergen Reactions  . Contrast Media (Iodinated Diagnostic Agents) Rash    Medications: Medications Prior to Admission  Medication Sig Dispense Refill  . aspirin 81 MG tablet Take 81 mg by mouth daily.        Marland Kitchen dextromethorphan-guaiFENesin (MUCINEX DM) 30-600 MG per 12 hr tablet Take 1 tablet by  mouth every 12 (twelve) hours as needed. For cough and cold       . fish oil-omega-3 fatty acids 1000 MG capsule Take 2 g by mouth daily.        Marland Kitchen levofloxacin (LEVAQUIN) 500 MG tablet Take 500 mg by mouth daily. For 7 days       . levothyroxine (SYNTHROID, LEVOTHROID) 75 MCG tablet Take 75 mcg by mouth daily.        . metoprolol (TOPROL-XL) 100 MG 24 hr tablet Take 100 mg by mouth daily.        . Multiple Vitamins-Minerals (MULTIVITAMIN WITH MINERALS) tablet Take 1 tablet by mouth daily.         No current facility-administered medications on file as of 09/10/2011.  Reproductive History G4P3 Menarche 14 Menopause : @time  of tah-bso; HRT for 5-10 yrs.  Social History:   reports that she has never smoked. She has never used smokeless tobacco. She reports that she does not drink alcohol or use illicit drugs.She and her husband have been married for 40 years. They have 3 children ( [name] and [name]), 4 grandchildren. 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   Family History: Family History  Problem Relation Age of Onset  . Hypertension Maternal Grandmother   . Diabetes Maternal Grandmother   . Cancer Father 61    lung cancer  . Hypertension Mother   .  Cancer Paternal Aunt     Ovarian                                                Paternal 1st cousin -breast cancer    Review of Systems: Constitutional ROS: Fever no, Chills, Night Sweats, Anorexia, Pain -no Cardiovascular ROS: no chest pain or dyspnea on exertion Respiratory ROS: no cough, shortness of breath, or wheezing Neurological ROS: no TIA or stroke symptoms Dermatological ROS: negative ENT ROS: negative Gastrointestinal ROS: no abdominal pain, change in bowel habits, or black or bloody stools Genito-Urinary ROS: no dysuria, trouble voiding, or hematuria Hematological and Lymphatic ROS: negative Breast ROS: positive for - new or changing breast lumps Musculoskeletal ROS:  negative Remaining ROS negative.  Physical Exam: There were no vitals taken for this visit. General appearance: alert, cooperative and appears stated age Head: Normocephalic, without obvious abnormality, atraumatic Neck: no adenopathy, no carotid bruit, no JVD, supple, symmetrical, trachea midline and thyroid not enlarged, symmetric, no tenderness/mass/nodules Lymph nodes: lt axillary LN 2 cm palpable, remainder-negative CVS: nl HS Pulmonary: Nl breath sounds CNS: nl  Lab Results: No results found for this basename: WBC, HGB, HCT, MCV, PLT     Chemistry      Component Value Date/Time   CREATININE 0.52 08/30/2011 0822   No results found for this basename: CALCIUM, ALKPHOS, AST, ALT, BILITOT       Radiological Studies: Breast MRI 08/30/11 1. 3.5 x 2.7 x 1.7 cm biopsy-proven invasive ductal carcinoma deep in the 3 o'clock position of the left breast centered slightly more toward the lower outer quadrant. 2.  Metastatic level I left axillary adenopathy. 3.  Additional abnormal enhancing left breast masses, as described above, with associated nipple retraction, skin thickening and abnormal enhancement.  This is suspicious for extensive multifocal disease. This could be confirmed with a skin punch biopsy of the thickened skin or MR guided core needle biopsy of the 12 o'clock mass if clinically indicated. 4.  Right breast mass and additional multiple areas of non mass enhancement, suspicious for multicentric malignancy in the right breast.  If clinically desired, a targeted ultrasound of the right breast could be performed to locate the anterior mass in the upper inner quadrant, for biopsy purposes.  The more posterior clumped, non mass enhancement in the lower outer quadrant could also be biopsied with MR guidance if clinically indicated. 5.  Findings suspicious for metastatic disease in the sternum and manubrium.   Impression and Plan: 61 yo woman with locally adfvanced  and possibly metastatic breast cancer. I discussed the natural history and potential treatments of this sort of mailgnancy. I recommended holding on port placement as fuly evauate the patient. If  Stage 4 disease is confirmed, then I would elect to begin therapy with using anti-estrogen therapy.The patients and her husband were given all this information. I have scheduled blood work and a staging PET scan.  A total of 70 minutes was spent with the patient, half that time was devoted to patient related counselling.     Pierce Crane, MD 09/10/2011, 10:38 PM

## 2011-09-11 ENCOUNTER — Other Ambulatory Visit: Payer: Self-pay | Admitting: Oncology

## 2011-09-11 NOTE — Progress Notes (Signed)
Patient History and Physical DOS: 09/07/11  [name] Riley [id number] [birth year] 60 y.o. 09/11/2011  CC: Dr Jearld Lesch Dr Gwyndolyn Kaufman Dr Daiva Nakayama  Chief Complaint: Breast cancer  HPI:  This is a previously well 60 year old woman from [facility], who last had mammogram in 2007 . She  detected a mass in her left breast in August of this year. She sought medical attention for this and had a mammogram in October of 2012. Mammogram revealed a  Suspicious 1.2 x 1.8 x 2.1 cm irregular mass in the outer left breast with suspicious 1 x 1.6 cm left axillary lymph node. A biopsy performed 08/25/2011 both of the mass and lymph node showed invasive ductal cancer, grade 3, ER and PR positive at 99 and 88% respectively. HER-2 was nonamplified. An MRI performed 08/30/2011 showed multiple findings. These included a 3.5 x 2.7 x 1.7 cm mass 2:00 position left breast, metastatic left axillary adenopathy, or nipple retraction skin thickening in the left breast. Right breast showed a multiple areas of a masslike enhancement as well as findings suspicious for metastatic disease of sternum and manubrium. The patient been seen by Dr. [name] and a discussion was held regarding port placement.    PMH: Past Medical History  Diagnosis Date  . Coronary artery disease   . Heart attack 2000/10/03    pt coded during this process; no CAD detected  . Hyperlipidemia   . Hypertension     Past Surgical History  Procedure Date  . Abdominal hysterectomy and BSO     1998  . Tonsillectomy   . Ovarian cyst surgery 1970  . Skin tags 05/09/1997    left axillary left neck skin tags  . Breast surgery 1998    removal of benign lump in rt breast    Allergies: Allergies  Allergen Reactions  . Contrast Media (Iodinated Diagnostic Agents) Rash    Medications: Medications Prior to Admission  Medication Sig Dispense Refill  . aspirin 81 MG tablet Take 81 mg by mouth daily.        . fish oil-omega-3 fatty acids  1000 MG capsule Take 2 g by mouth daily.        Marland Kitchen levothyroxine (SYNTHROID, LEVOTHROID) 75 MCG tablet Take 75 mcg by mouth daily.        . Multiple Vitamins-Minerals (MULTIVITAMIN WITH MINERALS) tablet Take 1 tablet by mouth daily.         No current facility-administered medications on file as of 09/07/2011.    Social History:   reports that she has never smoked. She has never used smokeless tobacco. She reports that she does not drink alcohol or use illicit drugs.She has been married for 40 years. They have 3 children. They have 2 grandchildren. 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  Reproductive history G4 P3, menarche age 38 menopause at time of hysterectomy. Use of hormone replacement therapy for 10 years. Family History: Family History  Problem Relation Age of Onset  . Hypertension Maternal Grandmother   . Diabetes Maternal Grandmother   . Cancer Father 74    lung cancer  . Hypertension Mother   . Cancer Paternal Aunt     ovarian    Review of Systems: Constitutional ROS: Fever- none, Chills, Night Sweats, Anorexia, Pain- none Cardiovascular ROS: no chest pain or dyspnea on exertion Respiratory ROS: no cough, shortness of breath, or wheezing Neurological ROS: no TIA or stroke symptoms Dermatological ROS: negative ENT ROS: negative  Gastrointestinal ROS: no abdominal pain, change in bowel habits, or black or bloody stools Genito-Urinary ROS: no dysuria, trouble voiding, or hematuria Hematological and Lymphatic ROS: negative Breast ROS: positive for - new or changing breast lumps Musculoskeletal ROS: negative Remaining ROS negative.  Physical Exam: Blood pressure 178/97, pulse 98, temperature 98 F (36.7 C), height 5\' 8"  (1.727 m), weight 184 lb (83.462 kg). General appearance: alert, cooperative and appears stated age Head: Normocephalic, without obvious abnormality, atraumatic Neck: no carotid bruit, no JVD, supple, symmetrical, trachea  midline and thyroid not enlarged, symmetric, no tenderness/mass/nodules Lymph nodes: Axillary adenopathy: 2 cm large lymph node palpable CVS: Normal heart sounds Pulmonary: Normal breath sounds Breasts: Right breast normal; left breast as a palpable mass measuring about 4 cm in the upper outer quadrant. There is nipple retraction appreciated no other skin changes are seen. Ext: Normal CNS: Normal   Lab Results: No results found for this basename: WBC, HGB, HCT, MCV, PLT     Chemistry      Component Value Date/Time   CREATININE 0.52 08/30/2011 0822   No results found for this basename: CALCIUM, ALKPHOS, AST, ALT, BILITOT       Radiological Studies: MRI BILATERAL BREAST MRI WITH AND WITHOUT CONTRAST  Technique: Multiplanar, multisequence MR images of both breasts  were obtained prior to and following the intravenous administration  of 17ml of MultiHance. Three dimensional images were evaluated at  the independent DynaCad workstation.  Comparison: Previous examinations, including the recent mammogram,  ultrasound and biopsy examinations.  Findings: Moderate background parenchymal enhancement in both  breasts.  3.5 x 2.7 x 1.7 cm oval enhancing mass with spiculated margins deep  in the outer left breast in the 3 o'clock position. This contains  a biopsy marker clip artifact and demonstrates a mixture of  enhancement kinetics, including rapid washin/washout. This abuts  the underlying pectoralis muscle without evidence of muscle  invasion.  Abnormally enlarged left axillary lymph node with abnormal cortical  thickening, measuring, 1.4 x 1.1 cm in maximum dimensions on image  number 161 of the second postcontrast series. There are additional  normal sized left axillary lymph nodes with diffuse cortical  thickening. No enlarged internal mammary or right axillary lymph  nodes are seen.  Deep in the upper outer quadrant of the left breast, at the level  of the superior aspect of  the known malignancy, a 0.7 x 0.6 x 0.5  mm rounded, smoothly marginated enhancing mass is demonstrated.  This also has a mixture of enhancement kinetics, including rapid  washin/washout.  In the central left breast, in the 12 o'clock position, a 1.8 x 0.8  x 0.7 cm linear mass with macrolobulated margins is demonstrated.  The margins are somewhat ill-defined in the sagittal plane and  better defined in the axial plane. This also has a mixture of  enhancement kinetics, including rapid washin/washout.  The left nipple is laterally displaced and retracted and  demonstrates peripheral rim enhancement. There is linear tissue  with low grade enhancement extending between the nipple and the  patient's known malignancy. There is also diffuse areolar and  periareolar skin thickening and abnormal enhancement on the left.  In the anterior aspect of the upper inner right breast, a 0.9 x 0.8  x 0.7 cm rounded enhancing mass is demonstrated. This has smooth,  minimally macrolobulated margins and demonstrates a mixture of  enhancement kinetics, including rapid washin/washout. At the same  level of the lower outer portion of the right breast, in  the  posterior third, a 1.7 x 0.7 x 0.7 cm linear area of clumped  enhancement is demonstrated. This also has a mixture of  enhancement kinetics, including rapid washin/washout.  Slightly more superiorly in the upper outer quadrant of the right  breast, the a 1.4 x 0.5 x 0.4 cm area of linear clumped enhancement  is demonstrated. This also exhibits a mixture of enhancement  kinetics, including rapid washin/washout.  In the lower outer quadrant of the posterior third of the right  breast, a 1.7 x 0.9 x 0.7 cm area of non mass-like clumped  enhancement is demonstrated. This also exhibits a mixture of  enhancement kinetics, including rapid washin/washout.  Also demonstrated is abnormal enhancement of the sternum and  manubrium with some bony expansion. This also  includes rapid  washin/washout.  IMPRESSION:  1. 3.5 x 2.7 x 1.7 cm biopsy-proven invasive ductal carcinoma deep  in the 3 o'clock position of the left breast centered slightly more  toward the lower outer quadrant.  2. Metastatic level I left axillary adenopathy.  3. Additional abnormal enhancing left breast masses, as described  above, with associated nipple retraction, skin thickening and  abnormal enhancement. This is suspicious for extensive multifocal  disease. This could be confirmed with a skin punch biopsy of the  thickened skin or MR guided core needle biopsy of the 12 o'clock  mass if clinically indicated.  4. Right breast mass and additional multiple areas of non mass  enhancement, suspicious for multicentric malignancy in the right  breast. If clinically desired, a targeted ultrasound of the right  breast could be performed to locate the anterior mass in the upper  inner quadrant, for biopsy purposes. The more posterior clumped,  non mass enhancement in the lower outer quadrant could also be  biopsied with MR guidance if clinically indicated.  5. Findings suspicious for metastatic disease in the sternum and  manubrium.    Impression and Plan: 60 year old woman locally advanced ER PR positive breast cancer. On MRI scan and appears she has disease in the other breast as well. Of concern is the fact that she has the possibility of metastatic disease with bone metastases seen in the sternum and manubrium. Discussed these findings with the patient. I recommended she had a PET scan for staging as well as blood work done today. I will review my findings with Dr. [name] and recommended that we hold off on port placement. I suspect that if she does have metastatic disease she be treated with antiestrogen therapy upfront. I plan to see the patient back in the following week after she has had her PET scan. I spent 70 minutes with the patient and her husband, half the time was spent in  patient-related counseling.     Pierce Crane, MD 09/11/2011, 9:52 AM

## 2011-09-12 ENCOUNTER — Telehealth: Payer: Self-pay | Admitting: *Deleted

## 2011-09-12 NOTE — Telephone Encounter (Signed)
Pattricia Boss RN from [facility] called and stated that patient called very upset and crying.  She had conflicting schedule for port placement and PET.  She stated that she needed to cancel port placement.  RN advised to keep schedule for port placement and will reschedule PET scan. Will notify patient  RN called patient and advised that Riley Lam, in scheduling,  will notify her when PET scan and office visit is rescheduled.  Pt instructed to keep PAC placement surgery scheduled for 09/20/11.  Pt verbalized understanding.

## 2011-09-13 ENCOUNTER — Telehealth: Payer: Self-pay | Admitting: Oncology

## 2011-09-13 NOTE — Telephone Encounter (Signed)
Called pt and informed her of new Pet scan appt for 12/06 @ 8am @ [facility] and f/u on 12/12

## 2011-09-14 ENCOUNTER — Encounter: Payer: Self-pay | Admitting: *Deleted

## 2011-09-14 ENCOUNTER — Telehealth: Payer: Self-pay | Admitting: *Deleted

## 2011-09-14 ENCOUNTER — Telehealth: Payer: Self-pay | Admitting: Oncology

## 2011-09-14 ENCOUNTER — Other Ambulatory Visit (INDEPENDENT_AMBULATORY_CARE_PROVIDER_SITE_OTHER): Payer: Self-pay

## 2011-09-14 ENCOUNTER — Other Ambulatory Visit (INDEPENDENT_AMBULATORY_CARE_PROVIDER_SITE_OTHER): Payer: Self-pay | Admitting: General Surgery

## 2011-09-14 ENCOUNTER — Inpatient Hospital Stay (HOSPITAL_COMMUNITY): Admission: RE | Admit: 2011-09-14 | Discharge: 2011-09-14 | Payer: Self-pay | Source: Ambulatory Visit

## 2011-09-14 NOTE — Telephone Encounter (Signed)
Husband called and they would like [name] to cancel her lab 11/28 and [facility] placement 12/4 per their discussion with Dr. Donnie Coffin on 11/21.  She is set up for a PET scan on 09/22/11 and then to see Dr. Donnie Coffin on 12/12.  Could they see him sooner than 12/12?   Call back number is [phone].

## 2011-09-14 NOTE — Progress Notes (Signed)
Mailed after letter to pt. 

## 2011-09-14 NOTE — Progress Notes (Signed)
Spoke with patient, states surgery has been cancelled at this time.

## 2011-09-14 NOTE — Telephone Encounter (Signed)
Attempted to return call multiple times with no answer and no voicemail available.

## 2011-09-14 NOTE — Progress Notes (Signed)
Contacted Dr. Fatima Sanger office, spoke with Suzette Battiest, confirmed patients surgery has been cancelled.

## 2011-09-14 NOTE — Telephone Encounter (Signed)
called pts husband and informed him that we cancelled pts port placement appt with Dr. Harlon Flor.  also informed husband that I could not move Pet scan to 12/04 see I advised him to keep pet scan that is scheduled for 12/06 and to keep MD appt for 12/12/202.  Husband understood instructions

## 2011-09-14 NOTE — Progress Notes (Signed)
Contacted Dr. Fatima Sanger office, spoke with Marcelino Duster and requested order for PAT.

## 2011-09-20 ENCOUNTER — Encounter (HOSPITAL_COMMUNITY): Admission: RE | Payer: Self-pay | Source: Ambulatory Visit

## 2011-09-20 ENCOUNTER — Ambulatory Visit (HOSPITAL_COMMUNITY): Admission: RE | Admit: 2011-09-20 | Payer: Self-pay | Source: Ambulatory Visit | Admitting: Surgery

## 2011-09-20 ENCOUNTER — Other Ambulatory Visit (HOSPITAL_COMMUNITY): Payer: Self-pay

## 2011-09-20 SURGERY — INSERTION, TUNNELED CENTRAL VENOUS DEVICE, WITH PORT
Anesthesia: General | Laterality: Left

## 2011-09-22 ENCOUNTER — Ambulatory Visit: Payer: Self-pay | Admitting: Oncology

## 2011-09-22 ENCOUNTER — Encounter (HOSPITAL_COMMUNITY)
Admission: RE | Admit: 2011-09-22 | Discharge: 2011-09-22 | Disposition: A | Discharge status: Home / Self Care | Payer: Medicaid Other | Source: Ambulatory Visit | Attending: Oncology | Admitting: Oncology

## 2011-09-22 DIAGNOSIS — I517 Cardiomegaly: Secondary | ICD-10-CM | POA: Insufficient documentation

## 2011-09-22 DIAGNOSIS — C773 Secondary and unspecified malignant neoplasm of axilla and upper limb lymph nodes: Secondary | ICD-10-CM | POA: Insufficient documentation

## 2011-09-22 DIAGNOSIS — C50919 Malignant neoplasm of unspecified site of unspecified female breast: Secondary | ICD-10-CM

## 2011-09-22 DIAGNOSIS — M129 Arthropathy, unspecified: Secondary | ICD-10-CM | POA: Insufficient documentation

## 2011-09-22 DIAGNOSIS — K8020 Calculus of gallbladder without cholecystitis without obstruction: Secondary | ICD-10-CM | POA: Insufficient documentation

## 2011-09-22 DIAGNOSIS — M899 Disorder of bone, unspecified: Secondary | ICD-10-CM | POA: Insufficient documentation

## 2011-09-22 LAB — GLUCOSE, CAPILLARY: Glucose-Capillary: 98 mg/dL (ref 70–99)

## 2011-09-22 MED ORDER — FLUDEOXYGLUCOSE F - 18 (FDG) INJECTION
17.9000 | Freq: Once | INTRAVENOUS | Status: AC | PRN
  Administered 2011-09-22: 17.9 via INTRAVENOUS

## 2011-09-28 ENCOUNTER — Ambulatory Visit (HOSPITAL_BASED_OUTPATIENT_CLINIC_OR_DEPARTMENT_OTHER): Payer: Medicaid Other | Admitting: Oncology

## 2011-09-28 VITALS — BP 208/107 | HR 82 | Temp 98.4°F | Ht 68.0 in | Wt 184.8 lb

## 2011-09-28 DIAGNOSIS — C50919 Malignant neoplasm of unspecified site of unspecified female breast: Secondary | ICD-10-CM

## 2011-09-28 DIAGNOSIS — C50912 Malignant neoplasm of unspecified site of left female breast: Secondary | ICD-10-CM

## 2011-10-02 NOTE — Progress Notes (Signed)
Ms [name] Riley returns with her husband. The PET scan did not reveal obvious evidence of metastatic disease. As such i have suggested that she have a biopsy of the 2 areas in her contralateral breast so that definitive plans can be made re-surgery. I suspect a combination of dcis and invasive disease , as such Ms [name] Riley will likely benefit from bilateral mastectomy. She and he rhusband are in agreement with the plan, and I will see her after surgery. Pierce Crane MD

## 2011-10-04 ENCOUNTER — Other Ambulatory Visit: Payer: Self-pay | Admitting: Oncology

## 2011-10-04 DIAGNOSIS — C50912 Malignant neoplasm of unspecified site of left female breast: Secondary | ICD-10-CM

## 2011-10-06 ENCOUNTER — Other Ambulatory Visit: Payer: Self-pay | Admitting: Oncology

## 2011-10-06 DIAGNOSIS — C50912 Malignant neoplasm of unspecified site of left female breast: Secondary | ICD-10-CM

## 2011-10-13 ENCOUNTER — Ambulatory Visit
Admission: RE | Admit: 2011-10-13 | Discharge: 2011-10-13 | Disposition: A | Discharge status: Home / Self Care | Payer: Medicaid Other | Source: Ambulatory Visit | Attending: Oncology | Admitting: Oncology

## 2011-10-13 ENCOUNTER — Other Ambulatory Visit: Payer: Self-pay | Admitting: Oncology

## 2011-10-13 DIAGNOSIS — C50912 Malignant neoplasm of unspecified site of left female breast: Secondary | ICD-10-CM

## 2011-10-14 ENCOUNTER — Other Ambulatory Visit: Payer: Self-pay | Admitting: Oncology

## 2011-10-14 DIAGNOSIS — R928 Other abnormal and inconclusive findings on diagnostic imaging of breast: Secondary | ICD-10-CM

## 2011-10-19 ENCOUNTER — Telehealth (INDEPENDENT_AMBULATORY_CARE_PROVIDER_SITE_OTHER): Payer: Self-pay | Admitting: General Surgery

## 2011-10-19 NOTE — Telephone Encounter (Signed)
Called Tammy and she will have Mrs Hamblin on next week to the breast conference list and pt has a appt on the 10-25-11 with [name]

## 2011-10-21 ENCOUNTER — Ambulatory Visit
Admission: RE | Admit: 2011-10-21 | Discharge: 2011-10-21 | Disposition: A | Discharge status: Home / Self Care | Payer: Medicaid Other | Source: Ambulatory Visit | Attending: Oncology | Admitting: Oncology

## 2011-10-21 DIAGNOSIS — R928 Other abnormal and inconclusive findings on diagnostic imaging of breast: Secondary | ICD-10-CM

## 2011-10-21 MED ORDER — GADOBENATE DIMEGLUMINE 529 MG/ML IV SOLN
14.0000 mL | Freq: Once | INTRAVENOUS | Status: AC | PRN
  Administered 2011-10-21: 14 mL via INTRAVENOUS

## 2011-10-25 ENCOUNTER — Encounter (INDEPENDENT_AMBULATORY_CARE_PROVIDER_SITE_OTHER): Payer: Medicaid Other | Admitting: Surgery

## 2011-10-27 ENCOUNTER — Ambulatory Visit (INDEPENDENT_AMBULATORY_CARE_PROVIDER_SITE_OTHER): Payer: Medicaid Other | Admitting: Surgery

## 2011-10-27 ENCOUNTER — Encounter (INDEPENDENT_AMBULATORY_CARE_PROVIDER_SITE_OTHER): Payer: Self-pay | Admitting: Surgery

## 2011-10-27 VITALS — BP 154/90 | HR 72 | Temp 98.2°F | Resp 18 | Ht 68.0 in | Wt 183.0 lb

## 2011-10-27 DIAGNOSIS — C50912 Malignant neoplasm of unspecified site of left female breast: Secondary | ICD-10-CM

## 2011-10-27 DIAGNOSIS — C50919 Malignant neoplasm of unspecified site of unspecified female breast: Secondary | ICD-10-CM

## 2011-10-27 NOTE — Progress Notes (Signed)
The patient returns for further evaluation. She has undergone extensive workup by Dr. Donnie Coffin. Her PET scan showed no sign of activity in her sternum or in her right breast.    She underwent 2 separate right breast biopsies, one with ultrasound and MRI. The biopsy results on the right side showed ductal proliferation with sclerosis and associated focal atypical apocrine hyperplasia. The differential includes a radial scar versus complex sclerosing lesion versus intraductal papilloma. Surgical excision is recommended.  On examination, she has significant bruising and hematoma in the lower part of her right breast.  The left breast examination is unchanged.   Filed Vitals:   10/27/11 0848  BP: 154/90  Pulse: 72  Temp: 98.2 F (36.8 C)  Resp: 18     We discussed the patient's case in breast conference. I spent some time discussing the options with the patient. She has already come to a decision. She would like bilateral mastectomies. Considering her presentation with locally advanced disease I believe that this is a reasonable option. She will likely need radiation postoperatively so I would not recommend immediate reconstruction. We will perform a left modified radical mastectomy, a right simple mastectomy, and we'll place a right subclavian vein port.  The surgical procedure has been discussed with the patient.  Potential risks, benefits, alternative treatments, and expected outcomes have been explained.  All of the patient's questions at this time have been answered.  The likelihood of reaching the patient's treatment goal is good.  The patient understand the proposed surgical procedure and wishes to proceed.    Previous note: 09/01/11 HPI AYOMIDE ZULETA is a 61 y.o. female.  The patient presents with a four-month history of a palpable mass in her left lower outer quadrant of her breast. She underwent mammogram and ultrasound. Subsequently she underwent ultrasound-guided biopsy of the mass as well  as a palpable lymph node. 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 The prognostic profile is pending. The lymph node showed metastatic carcinoma. On November 13, she underwent breast MRI. This confirmed the 3.5 x 2.7 x 1.7 invasive ductal carcinoma at 3:00 and left breast. She does have left axillary lymphadenopathy. She also has several other abnormally enhancing left breast masses including some thickening of the skin with distraction of the nipple. The right breast shows several areas of suspicious abnormal enhancement. She also has some abnormal findings in the sternum and the manubrium. She is referred today for surgical evaluation. She has an appointment next week with Dr. Pierce Crane for oncology.HPI  Past Medical History  Diagnosis Date  . Coronary artery disease   . Heart attack Nov 10, 2000    pt coded during this process  . Hyperlipidemia   . Hypertension     Past Surgical History  Procedure Date  . Abdominal hysterectomy     1998  . Tonsillectomy   . Ovarian cyst surgery 1970  . Skin tags 05/09/1997    left axillary left neck skin tags  . Breast surgery 1998    removal of benign lump in rt breast    Family History  Problem Relation Age of Onset  . Hypertension Maternal Grandmother   . Diabetes Maternal Grandmother   . Cancer Father 60    lung cancer  . Hypertension Mother   . Cancer Paternal Aunt     ovarian    Social History History  Substance Use Topics  . Smoking status: Never Smoker   . Smokeless tobacco: Never  Used  . Alcohol Use: No    Allergies  Allergen Reactions  . Contrast Media (Iodinated Diagnostic Agents) Rash    Current Outpatient Prescriptions  Medication Sig Dispense Refill  . aspirin 81 MG tablet Take 81 mg by mouth daily.        . fish oil-omega-3 fatty acids 1000 MG capsule Take 2 g by mouth daily.        Marland Kitchen levothyroxine (SYNTHROID, LEVOTHROID) 75 MCG tablet Take 75 mcg by mouth  daily.        . Multiple Vitamins-Minerals (MULTIVITAMIN WITH MINERALS) tablet Take 1 tablet by mouth daily.        . nebivolol (BYSTOLIC) 10 MG tablet Take 10 mg by mouth daily.          Review of Systems Review of Systems  Constitutional: Positive for chills. Negative for fever and unexpected weight change.  HENT: Positive for congestion and voice change. Negative for hearing loss, sore throat and trouble swallowing.   Eyes: Negative for visual disturbance.  Respiratory: Positive for wheezing. Negative for cough.   Cardiovascular: Negative for chest pain, palpitations and leg swelling.  Gastrointestinal: Negative for nausea, vomiting, abdominal pain, diarrhea, constipation, blood in stool, abdominal distention and anal bleeding.  Genitourinary: Negative for hematuria, vaginal bleeding and difficulty urinating.  Musculoskeletal: Negative for arthralgias.  Skin: Negative for rash and wound.  Neurological: Positive for headaches. Negative for seizures and syncope.  Hematological: Negative for adenopathy. Does not bruise/bleed easily.  Psychiatric/Behavioral: Negative for confusion.  Menarche - 15 First pregnancy - 22 Breast feeding - no Hormones - several years Menopause - at the time of hysterectomy  Blood pressure 158/102, pulse 72, temperature 98.4 F (36.9 C), temperature source Temporal, resp. rate 16, height 5\' 8"  (1.727 m), weight 184 lb 12.8 oz (83.825 kg).  Physical Exam Physical Exam WDWN in NAD HEENT:  EOMI, sclera anicteric Neck:  No masses, no thyromegaly Lungs:  CTA bilaterally; normal respiratory effort; no wheezing noted Breasts - slight nipple retraction laterally in the left breast, no right nipple retraction No dominant masses in the right breast; no axillary lymphadenopathy Left breast - lower outer quadrant - large 3-4 cm palpable firm mass, fixated to overlying skin; palpable lymph node in the axilla CV:  Regular rate and rhythm; no murmurs Abd:  +bowel  sounds, soft, non-tender, no masses Ext:  Well-perfused; no edema Skin:  Warm, dry; no sign of jaundice  Data Reviewed Mammogram/ ultrasound/ MRI  Assessment    Metastatic breast cancer - left breast with positive axillary lymph node.  Incompletely staged - if metastatic work-up is negative, then she will need further core biopsies of the right breast.    Plan    She has an appointment to see Dr. Donnie Coffin next week to discuss neoadjuvant therapy.  I went ahead and counseled her regarding port placement.  The surgical procedure has been discussed with the patient.  Potential risks, benefits, alternative treatments, and expected outcomes have been explained.  All of the patient's questions at this time have been answered.  The likelihood of reaching the patient's treatment goal is good.  The patient understand the proposed surgical procedure and wishes to proceed.  I spent a considerable amount of time (60 min) with the patient and her husband discussing the MRI findings and expected course of treatment.  They are obviously quite shocked with the possibility that she might have bilateral multicentric disease with possible bone metastases.  They understand the need for neoadjuvant therapy  before planning any type of surgical resection.      Wilmon Arms. [name], MD, [facility] Surgery  10/27/2011 2:12 PM

## 2011-10-31 ENCOUNTER — Encounter (HOSPITAL_COMMUNITY): Payer: Self-pay | Admitting: Pharmacy Technician

## 2011-10-31 ENCOUNTER — Encounter: Payer: Self-pay | Admitting: *Deleted

## 2011-11-01 ENCOUNTER — Telehealth: Payer: Self-pay | Admitting: Oncology

## 2011-11-01 NOTE — Telephone Encounter (Signed)
S/w the pt and she is aware of her appts on 12/08/2011@11 :30am

## 2011-11-07 ENCOUNTER — Other Ambulatory Visit: Payer: Self-pay

## 2011-11-07 ENCOUNTER — Encounter (HOSPITAL_COMMUNITY)
Admission: RE | Admit: 2011-11-07 | Discharge: 2011-11-07 | Disposition: A | Discharge status: Home / Self Care | Payer: Medicaid Other | Source: Ambulatory Visit | Attending: Anesthesiology | Admitting: Anesthesiology

## 2011-11-07 ENCOUNTER — Ambulatory Visit: Payer: Medicaid Other | Admitting: Oncology

## 2011-11-07 ENCOUNTER — Encounter (HOSPITAL_COMMUNITY): Payer: Self-pay

## 2011-11-07 ENCOUNTER — Telehealth: Payer: Self-pay

## 2011-11-07 ENCOUNTER — Encounter (HOSPITAL_COMMUNITY)
Admission: RE | Admit: 2011-11-07 | Discharge: 2011-11-07 | Disposition: A | Discharge status: Home / Self Care | Payer: Medicaid Other | Source: Ambulatory Visit | Attending: Surgery | Admitting: Surgery

## 2011-11-07 HISTORY — DX: Hypothyroidism, unspecified: E03.9

## 2011-11-07 HISTORY — DX: Nausea with vomiting, unspecified: R11.2

## 2011-11-07 HISTORY — DX: Other specified postprocedural states: Z98.890

## 2011-11-07 HISTORY — DX: Malignant (primary) neoplasm, unspecified: C80.1

## 2011-11-07 LAB — BASIC METABOLIC PANEL
BUN: 12 mg/dL (ref 6–23)
CO2: 26 mEq/L (ref 19–32)
Calcium: 9.7 mg/dL (ref 8.4–10.5)
Chloride: 103 mEq/L (ref 96–112)
Creatinine, Ser: 0.59 mg/dL (ref 0.50–1.10)
GFR calc Af Amer: >90 mL/min (ref >90)
GFR calc non Af Amer: >90 mL/min (ref >90)
Glucose, Bld: 93 mg/dL (ref 70–99)
Potassium: 3.4 mEq/L — ABNORMAL LOW (ref 3.5–5.1)
Sodium: 140 mEq/L (ref 135–145)

## 2011-11-07 LAB — CBC
HCT: 43.2 % (ref 36.0–46.0)
Hemoglobin: 14.9 g/dL (ref 12.0–15.0)
MCH: 30.8 pg (ref 26.0–34.0)
MCHC: 34.5 g/dL (ref 30.0–36.0)
MCV: 89.4 fL (ref 78.0–100.0)
Platelets: 293 10*3/uL (ref 150–400)
RBC: 4.83 MIL/uL (ref 3.87–5.11)
RDW: 12.4 % (ref 11.5–15.5)
WBC: 6.5 10*3/uL (ref 4.0–10.5)

## 2011-11-07 LAB — SURGICAL PCR SCREEN
MRSA, PCR: NEGATIVE
Staphylococcus aureus: POSITIVE — AB

## 2011-11-07 NOTE — Progress Notes (Signed)
1/21    HAVE REQUESTED ANYTHING CARDIAC RELATED (2001) FROM DR [facility] OFFICE...I DID NOT SEE ANYTHING IN EPIC.Marland KitchenMarland KitchenMarland Kitchen

## 2011-11-07 NOTE — Pre-Procedure Instructions (Signed)
20 [name] Riley  11/07/2011   Your procedure is scheduled on:  Monday, January 28 TH  Report to [facility] at 9:00 AM.  Call this number if you have problems the morning of surgery: [phone]   Remember:   Do not eat food:After Midnight Sunday .  May have clear liquids: up to 4 Hours before arrival 5:00 AM.  Clear liquids include soda, tea, black coffee, apple or grape juice, broth.   Take these medicines the morning of surgery with A SIP OF WATER: TOPROL              AND LEVOTHYROXINE.   Do not wear jewelry, make-up or nail polish.   Do not wear lotions, powders, or perfumes. You may wear deodorant.  Do not shave 48 hours prior to surgery.  Do not bring valuables to the hospital.   Contacts, dentures or bridgework may not be worn into surgery.  Leave suitcase in the car. After surgery it may be brought to your room.  For patients admitted to the hospital, checkout time is 11:00 AM the day of discharge.   Patients discharged the day of surgery will not be allowed to drive home.  Name and phone number of your driver: *[name] Riley  --[phone].  Special Instructions: CHG Shower Use Special Wash: 1/2 bottle night before surgery and 1/2 bottle morning of surgery.   Please read over the following fact sheets that you were given: Pain Booklet, MRSA Information and Surgical Site Infection Prevention

## 2011-11-07 NOTE — Telephone Encounter (Signed)
Last to Stress (From [facility]) faxed to [place] @ [phone] 11/07/11/KM

## 2011-11-13 MED ORDER — CEFAZOLIN SODIUM 1-5 GM-% IV SOLN: 1.0000 g | INTRAVENOUS | Status: DC

## 2011-11-13 MED ORDER — CEFAZOLIN SODIUM-DEXTROSE 2-3 GM-% IV SOLR
2.0000 g | INTRAVENOUS | Status: AC
  Administered 2011-11-14: 2 g via INTRAVENOUS
  Filled 2011-11-13: qty 50

## 2011-11-14 ENCOUNTER — Other Ambulatory Visit (INDEPENDENT_AMBULATORY_CARE_PROVIDER_SITE_OTHER): Payer: Self-pay | Admitting: Surgery

## 2011-11-14 ENCOUNTER — Encounter (HOSPITAL_COMMUNITY)
Admission: RE | Disposition: A | Discharge status: Home / Self Care | Payer: Self-pay | Source: Ambulatory Visit | Attending: Surgery

## 2011-11-14 ENCOUNTER — Encounter (HOSPITAL_COMMUNITY): Payer: Self-pay | Admitting: *Deleted

## 2011-11-14 ENCOUNTER — Ambulatory Visit (HOSPITAL_COMMUNITY): Payer: Medicaid Other | Admitting: *Deleted

## 2011-11-14 ENCOUNTER — Ambulatory Visit (HOSPITAL_COMMUNITY): Payer: Medicaid Other

## 2011-11-14 ENCOUNTER — Inpatient Hospital Stay (HOSPITAL_COMMUNITY)
Admission: RE | Admit: 2011-11-14 | Discharge: 2011-11-16 | DRG: 582 | Disposition: A | Discharge status: Home / Self Care | Payer: Medicaid Other | Source: Ambulatory Visit | Attending: Surgery | Admitting: Surgery

## 2011-11-14 DIAGNOSIS — I252 Old myocardial infarction: Secondary | ICD-10-CM

## 2011-11-14 DIAGNOSIS — C50919 Malignant neoplasm of unspecified site of unspecified female breast: Secondary | ICD-10-CM

## 2011-11-14 DIAGNOSIS — E785 Hyperlipidemia, unspecified: Secondary | ICD-10-CM | POA: Diagnosis present

## 2011-11-14 DIAGNOSIS — I2510 Atherosclerotic heart disease of native coronary artery without angina pectoris: Secondary | ICD-10-CM | POA: Diagnosis present

## 2011-11-14 DIAGNOSIS — N6089 Other benign mammary dysplasias of unspecified breast: Secondary | ICD-10-CM

## 2011-11-14 DIAGNOSIS — L905 Scar conditions and fibrosis of skin: Secondary | ICD-10-CM | POA: Diagnosis present

## 2011-11-14 DIAGNOSIS — I10 Essential (primary) hypertension: Secondary | ICD-10-CM | POA: Diagnosis present

## 2011-11-14 DIAGNOSIS — Z801 Family history of malignant neoplasm of trachea, bronchus and lung: Secondary | ICD-10-CM

## 2011-11-14 DIAGNOSIS — C773 Secondary and unspecified malignant neoplasm of axilla and upper limb lymph nodes: Secondary | ICD-10-CM | POA: Diagnosis present

## 2011-11-14 DIAGNOSIS — N6019 Diffuse cystic mastopathy of unspecified breast: Secondary | ICD-10-CM

## 2011-11-14 DIAGNOSIS — Z90710 Acquired absence of both cervix and uterus: Secondary | ICD-10-CM

## 2011-11-14 DIAGNOSIS — Z7982 Long term (current) use of aspirin: Secondary | ICD-10-CM

## 2011-11-14 DIAGNOSIS — E039 Hypothyroidism, unspecified: Secondary | ICD-10-CM | POA: Diagnosis present

## 2011-11-14 DIAGNOSIS — Z91041 Radiographic dye allergy status: Secondary | ICD-10-CM

## 2011-11-14 DIAGNOSIS — C50912 Malignant neoplasm of unspecified site of left female breast: Secondary | ICD-10-CM

## 2011-11-14 HISTORY — PX: PORTACATH PLACEMENT: SHX2246

## 2011-11-14 HISTORY — PX: BREAST SURGERY: SHX581

## 2011-11-14 SURGERY — MASTECTOMY, MODIFIED RADICAL
Anesthesia: General | Laterality: Right | Wound class: Clean

## 2011-11-14 MED ORDER — MIDAZOLAM HCL 5 MG/5ML IJ SOLN
INTRAMUSCULAR | Status: DC | PRN
  Administered 2011-11-14: 2 mg via INTRAVENOUS

## 2011-11-14 MED ORDER — KCL IN DEXTROSE-NACL 20-5-0.45 MEQ/L-%-% IV SOLN
INTRAVENOUS | Status: DC
Start: 2011-11-14 — End: 2011-11-16
  Administered 2011-11-14 – 2011-11-15 (×3): via INTRAVENOUS
  Filled 2011-11-14 (×5): qty 1000

## 2011-11-14 MED ORDER — ROCURONIUM BROMIDE 100 MG/10ML IV SOLN
INTRAVENOUS | Status: DC | PRN
  Administered 2011-11-14: 50 mg via INTRAVENOUS

## 2011-11-14 MED ORDER — OXYCODONE-ACETAMINOPHEN 5-325 MG PO TABS
1.0000 | ORAL_TABLET | ORAL | Status: DC | PRN
  Administered 2011-11-15 (×2): 1 via ORAL
  Administered 2011-11-16: 2 via ORAL
  Filled 2011-11-14 (×2): qty 1
  Filled 2011-11-14: qty 2

## 2011-11-14 MED ORDER — HYDROMORPHONE HCL PF 1 MG/ML IJ SOLN
0.2500 mg | INTRAMUSCULAR | Status: DC | PRN
  Administered 2011-11-14: 0.5 mg via INTRAVENOUS

## 2011-11-14 MED ORDER — ONDANSETRON HCL 4 MG/2ML IJ SOLN: 4.0000 mg | Freq: Four times a day (QID) | INTRAMUSCULAR | Status: DC | PRN

## 2011-11-14 MED ORDER — HEPARIN SOD (PORK) LOCK FLUSH 100 UNIT/ML IV SOLN
INTRAVENOUS | Status: DC | PRN
  Administered 2011-11-14: 500 [IU] via INTRAVENOUS

## 2011-11-14 MED ORDER — ONDANSETRON HCL 4 MG/2ML IJ SOLN
INTRAMUSCULAR | Status: DC | PRN
  Administered 2011-11-14 (×2): 4 mg via INTRAVENOUS

## 2011-11-14 MED ORDER — CEFAZOLIN SODIUM 1-5 GM-% IV SOLN
1.0000 g | Freq: Four times a day (QID) | INTRAVENOUS | Status: AC
  Administered 2011-11-14 – 2011-11-15 (×3): 1 g via INTRAVENOUS
  Filled 2011-11-14 (×3): qty 50

## 2011-11-14 MED ORDER — FENTANYL CITRATE 0.05 MG/ML IJ SOLN
INTRAMUSCULAR | Status: DC | PRN
  Administered 2011-11-14: 100 ug via INTRAVENOUS
  Administered 2011-11-14: 50 ug via INTRAVENOUS
  Administered 2011-11-14: 25 ug via INTRAVENOUS
  Administered 2011-11-14: 50 ug via INTRAVENOUS
  Administered 2011-11-14 (×2): 25 ug via INTRAVENOUS
  Administered 2011-11-14 (×2): 50 ug via INTRAVENOUS
  Administered 2011-11-14: 25 ug via INTRAVENOUS

## 2011-11-14 MED ORDER — HYDROMORPHONE HCL PF 1 MG/ML IJ SOLN: 1.0000 mg | INTRAMUSCULAR | Status: DC | PRN

## 2011-11-14 MED ORDER — ONDANSETRON HCL 4 MG PO TABS: 4.0000 mg | ORAL_TABLET | Freq: Four times a day (QID) | ORAL | Status: DC | PRN

## 2011-11-14 MED ORDER — PHENYLEPHRINE HCL 10 MG/ML IJ SOLN
INTRAMUSCULAR | Status: DC | PRN
  Administered 2011-11-14: 80 ug via INTRAVENOUS

## 2011-11-14 MED ORDER — SCOPOLAMINE 1 MG/3DAYS TD PT72
MEDICATED_PATCH | TRANSDERMAL | Status: DC | PRN
  Administered 2011-11-14: 1 via TRANSDERMAL

## 2011-11-14 MED ORDER — LEVOTHYROXINE SODIUM 75 MCG PO TABS
75.0000 ug | ORAL_TABLET | ORAL | Status: DC
  Administered 2011-11-15 – 2011-11-16 (×2): 75 ug via ORAL
  Filled 2011-11-14 (×4): qty 1

## 2011-11-14 MED ORDER — ACETAMINOPHEN 10 MG/ML IV SOLN
INTRAVENOUS | Status: DC | PRN
  Administered 2011-11-14: 1000 mg via INTRAVENOUS

## 2011-11-14 MED ORDER — ONDANSETRON HCL 4 MG/2ML IJ SOLN
4.0000 mg | Freq: Four times a day (QID) | INTRAMUSCULAR | Status: DC | PRN
  Administered 2011-11-14 – 2011-11-15 (×3): 4 mg via INTRAVENOUS
  Filled 2011-11-14 (×3): qty 2

## 2011-11-14 MED ORDER — METOPROLOL SUCCINATE ER 100 MG PO TB24
100.0000 mg | ORAL_TABLET | Freq: Every day | ORAL | Status: DC
  Administered 2011-11-15 – 2011-11-16 (×2): 100 mg via ORAL
  Filled 2011-11-14 (×2): qty 1

## 2011-11-14 MED ORDER — SODIUM CHLORIDE 0.9 % IR SOLN
Status: DC | PRN
  Administered 2011-11-14: 13:00:00

## 2011-11-14 MED ORDER — 0.9 % SODIUM CHLORIDE (POUR BTL) OPTIME
TOPICAL | Status: DC | PRN
  Administered 2011-11-14: 2000 mL

## 2011-11-14 MED ORDER — LACTATED RINGERS IV SOLN
INTRAVENOUS | Status: DC | PRN
  Administered 2011-11-14 (×3): via INTRAVENOUS

## 2011-11-14 MED ORDER — ACETAMINOPHEN 10 MG/ML IV SOLN
INTRAVENOUS | Status: AC
  Filled 2011-11-14: qty 100

## 2011-11-14 MED ORDER — ENOXAPARIN SODIUM 40 MG/0.4ML ~~LOC~~ SOLN
40.0000 mg | SUBCUTANEOUS | Status: DC
  Administered 2011-11-15: 40 mg via SUBCUTANEOUS
  Filled 2011-11-14 (×2): qty 0.4

## 2011-11-14 MED ORDER — EPHEDRINE SULFATE 50 MG/ML IJ SOLN
INTRAMUSCULAR | Status: DC | PRN
  Administered 2011-11-14 (×4): 5 mg via INTRAVENOUS
  Administered 2011-11-14: 10 mg via INTRAVENOUS
  Administered 2011-11-14 (×4): 5 mg via INTRAVENOUS

## 2011-11-14 MED ORDER — PROPOFOL 10 MG/ML IV EMUL
INTRAVENOUS | Status: DC | PRN
  Administered 2011-11-14: 50 mg via INTRAVENOUS
  Administered 2011-11-14: 150 mg via INTRAVENOUS

## 2011-11-14 SURGICAL SUPPLY — 76 items
APPLIER CLIP 9.375 MED OPEN (MISCELLANEOUS) ×6
BAG DECANTER FOR FLEXI CONT (MISCELLANEOUS) ×3 IMPLANT
BENZOIN TINCTURE PRP APPL 2/3 (GAUZE/BANDAGES/DRESSINGS) ×3 IMPLANT
BINDER BREAST LRG (GAUZE/BANDAGES/DRESSINGS) IMPLANT
BINDER BREAST XLRG (GAUZE/BANDAGES/DRESSINGS) IMPLANT
BLADE SURG 11 STRL SS (BLADE) ×3 IMPLANT
BLADE SURG 15 STRL LF DISP TIS (BLADE) ×2 IMPLANT
BLADE SURG 15 STRL SS (BLADE) ×1
CANISTER SUCTION 2500CC (MISCELLANEOUS) ×3 IMPLANT
CHLORAPREP W/TINT 10.5 ML (MISCELLANEOUS) ×3 IMPLANT
CHLORAPREP W/TINT 26ML (MISCELLANEOUS) ×3 IMPLANT
CLIP APPLIE 9.375 MED OPEN (MISCELLANEOUS) ×4 IMPLANT
CLOTH BEACON ORANGE TIMEOUT ST (SAFETY) ×3 IMPLANT
CONT SPEC 4OZ CLIKSEAL STRL BL (MISCELLANEOUS) IMPLANT
COVER PROBE W GEL 5X96 (DRAPES) IMPLANT
COVER SURGICAL LIGHT HANDLE (MISCELLANEOUS) ×3 IMPLANT
CRADLE DONUT ADULT HEAD (MISCELLANEOUS) ×3 IMPLANT
DECANTER SPIKE VIAL GLASS SM (MISCELLANEOUS) ×3 IMPLANT
DERMABOND ADVANCED (GAUZE/BANDAGES/DRESSINGS) ×1
DERMABOND ADVANCED .7 DNX12 (GAUZE/BANDAGES/DRESSINGS) ×2 IMPLANT
DRAIN CHANNEL 19F RND (DRAIN) IMPLANT
DRAPE C-ARM 42X72 X-RAY (DRAPES) ×3 IMPLANT
DRAPE LAPAROSCOPIC ABDOMINAL (DRAPES) ×3 IMPLANT
DRAPE PED LAPAROTOMY (DRAPES) ×3 IMPLANT
DRAPE UTILITY 15X26 W/TAPE STR (DRAPE) ×6 IMPLANT
DRSG OPSITE 4X5.5 SM (GAUZE/BANDAGES/DRESSINGS) ×3 IMPLANT
ELECT BLADE 4.0 EZ CLEAN MEGAD (MISCELLANEOUS) ×3
ELECT CAUTERY BLADE 6.4 (BLADE) ×3 IMPLANT
ELECT REM PT RETURN 9FT ADLT (ELECTROSURGICAL) ×3
ELECTRODE BLDE 4.0 EZ CLN MEGD (MISCELLANEOUS) ×2 IMPLANT
ELECTRODE REM PT RTRN 9FT ADLT (ELECTROSURGICAL) ×2 IMPLANT
EVACUATOR SILICONE 100CC (DRAIN) ×9 IMPLANT
GAUZE SPONGE 2X2 8PLY STRL LF (GAUZE/BANDAGES/DRESSINGS) ×2 IMPLANT
GAUZE SPONGE 4X4 16PLY XRAY LF (GAUZE/BANDAGES/DRESSINGS) ×3 IMPLANT
GLOVE BIO SURGEON STRL SZ7 (GLOVE) ×3 IMPLANT
GLOVE BIOGEL PI IND STRL 7.5 (GLOVE) ×6 IMPLANT
GLOVE BIOGEL PI IND STRL 8 (GLOVE) ×2 IMPLANT
GLOVE BIOGEL PI INDICATOR 7.5 (GLOVE) ×3
GLOVE BIOGEL PI INDICATOR 8 (GLOVE) ×1
GLOVE ECLIPSE 8.0 STRL XLNG CF (GLOVE) ×3 IMPLANT
GOWN STRL NON-REIN LRG LVL3 (GOWN DISPOSABLE) ×15 IMPLANT
INTRODUCER COOK 11FR (CATHETERS) IMPLANT
KIT BASIN OR (CUSTOM PROCEDURE TRAY) ×3 IMPLANT
KIT PORT POWER 9.6FR MRI PREA (Catheter) IMPLANT
KIT PORT POWER ISP 8FR (Catheter) IMPLANT
KIT POWER CATH 8FR (Catheter) IMPLANT
KIT ROOM TURNOVER OR (KITS) ×3 IMPLANT
NEEDLE 18GX1X1/2 (RX/OR ONLY) (NEEDLE) IMPLANT
NEEDLE HYPO 25GX1X1/2 BEV (NEEDLE) ×3 IMPLANT
NS IRRIG 1000ML POUR BTL (IV SOLUTION) ×3 IMPLANT
PACK GENERAL/GYN (CUSTOM PROCEDURE TRAY) ×3 IMPLANT
PACK SURGICAL SETUP 50X90 (CUSTOM PROCEDURE TRAY) ×3 IMPLANT
PAD ARMBOARD 7.5X6 YLW CONV (MISCELLANEOUS) ×6 IMPLANT
PENCIL BUTTON HOLSTER BLD 10FT (ELECTRODE) ×3 IMPLANT
SET INTRODUCER 12FR PACEMAKER (SHEATH) IMPLANT
SET SHEATH INTRODUCER 10FR (MISCELLANEOUS) IMPLANT
SHEATH COOK PEEL AWAY SET 9F (SHEATH) IMPLANT
SPECIMEN JAR LARGE (MISCELLANEOUS) ×6 IMPLANT
SPONGE GAUZE 2X2 STER 10/PKG (GAUZE/BANDAGES/DRESSINGS) ×1
SPONGE GAUZE 4X4 12PLY (GAUZE/BANDAGES/DRESSINGS) ×3 IMPLANT
SPONGE INTESTINAL PEANUT (DISPOSABLE) ×3 IMPLANT
SPONGE LAP 18X18 X RAY DECT (DISPOSABLE) ×3 IMPLANT
STAPLER VISISTAT 35W (STAPLE) ×6 IMPLANT
STRIP CLOSURE SKIN 1/2X4 (GAUZE/BANDAGES/DRESSINGS) ×3 IMPLANT
SUT ETHILON 3 0 FSL (SUTURE) ×9 IMPLANT
SUT MNCRL AB 4-0 PS2 18 (SUTURE) ×3 IMPLANT
SUT PROLENE 2 0 SH DA (SUTURE) ×3 IMPLANT
SUT VIC AB 3-0 SH 18 (SUTURE) ×6 IMPLANT
SUT VIC AB 3-0 SH 27 (SUTURE) ×1
SUT VIC AB 3-0 SH 27X BRD (SUTURE) ×2 IMPLANT
SYR 20ML ECCENTRIC (SYRINGE) ×6 IMPLANT
SYR 5ML LUER SLIP (SYRINGE) ×3 IMPLANT
SYR CONTROL 10ML LL (SYRINGE) ×3 IMPLANT
TOWEL OR 17X24 6PK STRL BLUE (TOWEL DISPOSABLE) ×3 IMPLANT
TOWEL OR 17X26 10 PK STRL BLUE (TOWEL DISPOSABLE) ×3 IMPLANT
WATER STERILE IRR 1000ML POUR (IV SOLUTION) ×3 IMPLANT

## 2011-11-14 NOTE — Transfer of Care (Signed)
Immediate Anesthesia Transfer of Care Note  Patient: [name] Riley  Procedure(s) Performed:  MASTECTOMY MODIFIED RADICAL - Left modified radical mastectomy, right total mastectomy.; INSERTION PORT-A-CATH  Patient Location: PACU  Anesthesia Type: General  Level of Consciousness: sedated  Airway & Oxygen Therapy: Patient Spontanous Breathing and Patient connected to nasal cannula oxygen  Post-op Assessment: Report given to PACU RN and Post -op Vital signs reviewed and stable  Post vital signs: Reviewed and stable  Complications: No apparent anesthesia complications

## 2011-11-14 NOTE — Preoperative (Signed)
Beta Blockers   Reason not to administer Beta Blockers:Not Applicable 

## 2011-11-14 NOTE — Anesthesia Postprocedure Evaluation (Signed)
  Anesthesia Post-op Note  Patient: [name] Riley  Procedure(s) Performed:  MASTECTOMY MODIFIED RADICAL - Left modified radical mastectomy, right total mastectomy.; INSERTION PORT-A-CATH  Patient Location: PACU  Anesthesia Type: General  Level of Consciousness: awake, alert  and oriented  Airway and Oxygen Therapy: Patient Spontanous Breathing and Patient connected to nasal cannula oxygen  Post-op Pain: mild  Post-op Assessment: Post-op Vital signs reviewed and Patient's Cardiovascular Status Stable  Post-op Vital Signs: stable  Complications: No apparent anesthesia complications

## 2011-11-14 NOTE — Interval H&P Note (Signed)
History and Physical Interval Note:  11/14/2011 7:38 AM  [name] Riley  has presented today for surgery, with the diagnosis of Left breast cancer, right breast mass.  The various methods of treatment have been discussed with the patient and family. After consideration of risks, benefits and other options for treatment, the patient has consented to  Procedure(s): MASTECTOMY MODIFIED RADICAL - left, simple mastectomy - right INSERTION PORT-A-CATH as a surgical intervention .  The patients' history has been reviewed, patient examined, no change in status, stable for surgery.  I have reviewed the patients' chart and labs.  Questions were answered to the patient's satisfaction.     Shina Wass K.

## 2011-11-14 NOTE — Op Note (Signed)
Preop diagnosis: #1 left breast cancer #2 metastatic cancer to the left axillary nodes #3 radial scar right breast  Postop diagnosis: Same  Procedure performed #1 right subclavian vein port placement #2 right simple mastectomy #3 left modified radical mastectomy  Surgeon:Cuong Moorman K.  Assistant:  Dr. Abbey Chatters  Anesthesia: Gen. endotracheal  Indications: This is a 61 year old female who was recently diagnosed with left breast cancer with metastatic lymph nodes in the left axilla. She had some suspicious findings in the right breast which are seen on MRI. These were biopsied and showed a radial scar with excision recommended. After discussion with oncology the patient has reached the decision to proceed with bilateral mastectomies and a left axillary lymph node dissection. She also needs a port placed for chemotherapy.  Description of procedure: The patient was brought to the operating room and placed in a supine position on the operating room table. After an adequate level of general anesthesia was obtained, a roll was placed behind the patient's shoulder. Her arms were tucked. Her chest was prepped with chlor prep and draped in sterile fashion. A timeout was taken to ensure the proper patient and proper procedure. The patient was placed in Trendelenburg position. An 18-gauge needle was used to cannulate the right subclavian vein. Good blood return was noted. A wire was passed through the needle into the subclavian vein and into the right atrium. This was confirmed with fluoroscopy. We removed the needle. A subcutaneous pocket was then created in the right chest just below the vocal. An 8 [place] power port device was brought onto the field. This was tunneled from the subcutaneous pocket up through the subcutaneous fat to the original insertion site. The port was sutured into place with 2-0 Prolene sutures. The catheter was cut at 23 cm after measuring using fluoroscopy. The breakaway sheath and  dilator were passed over the wire through the clavipectoral fascia into the right subclavian vein. This was visualized on fluoroscopy. The dilator and wire were removed. The catheter was advanced through the breakaway sheath which was then removed. We were able to aspirate blood from the port and flushed easily with heparinized saline. Fluoroscopy was used to confirm that there were along the course of the catheter. The wound was then closed with a deep layer of 3-0 Vicryl. 4 Monocryl was used to close the skin. Dermabond was used to seal the skin edges. The port was then instilled with concentrated heparin solution. We then turned our attention to the remainder of the surgery.  We reprepped the entire chest and both axillas with chlor prep and draped in sterile fashion. Another timeout was taken to ensure the patient and proper procedure. We began on the right side. We outlined an elliptical incision around the nipple and areolar complex and made the incision with a scalpel. We raised skin flaps up to the infraclavicular chest wall. We did not encounter the previously placed port. Medially we stopped at the edge of the sternum. Inferiorly we raised skin flaps down to the inframammary crease. Laterally we dissected to the anterior edge of the latissimus dorsi. The breast was then dissected free from the chest wall and a medial to lateral direction. We took the fascia of the pectoralis muscle. The specimen was oriented with a suture in the lateral margin. This is sent for pathologic examination. We inspected carefully for hemostasis and packed a sponge into the wound.  We then outlined a similar incision on the left side. The patient has a hard tumor  in the left lower outer quadrant that seems to come close to the skin. We open the incision with a scalpel. Skin flaps were raised in all directions in similar fashion. However when we were raising our inferior flap is seen that the tumor was adherent to a certain  point on the posterior surface of the dermis. We marked this area for later attention. Once we had dissected medially superiorly and inferiorly we dissected the breast tissue off of the chest wall. We then dissected up into the axilla. We identified the intercostal brachial nerve which had to be divided the dislocation. We could palpate several positive lymph nodes within the axilla. We only dissected up until we could clearly identify the left axillary vein. With meticulous dissection we were able to identify the thoracodorsal neurovascular bundle as well as the long thoracic nerve. We cleared the axillary contents from the axillary vein down between these two nerves down to the chest wall. We took all the axillary contents as a continuous specimen with the breast. We dissected the remainder of the tissue free from the chest wall out to the latissimus dorsi muscle. The suture was used to mark the lateral aspect of the specimen. We inspected carefully for hemostasis. Our anatomy was easily visualized. We irrigated thoroughly. 2 drains were placed in the left side. One was cut to fit in the axilla and the other was placed across the mastectomy site. These were sutured in place with 3-0 nylon. Once we were happy with our hemostasis we closed with a deep dermal layer of interrupted 3-0 Vicryl sutures. Staples were used to close the skin. We turned our attention to the right side. We again inspected for hemostasis and irrigated. A drain was placed across the mastectomy site. This was sutured in place with a 3-0 nylon suture The wound was closed in similar fashion with 3-0 Vicryl and staples. All the drains were placed to bulb suction. The wound was dressed with Xeroform gauze 4 x 4's and a breast binder.  The patient was then extubated and brought to the recovery room in stable condition. All sponge, initially, and needle counts are correct.  Wilmon Arms. [name], MD, [facility] Surgery  11/14/2011 2:43  PM

## 2011-11-14 NOTE — H&P (View-Only) (Signed)
The patient returns for further evaluation. She has undergone extensive workup by Dr. Rubin. Her PET scan showed no sign of activity in her sternum or in her right breast.    She underwent 2 separate right breast biopsies, one with ultrasound and MRI. The biopsy results on the right side showed ductal proliferation with sclerosis and associated focal atypical apocrine hyperplasia. The differential includes a radial scar versus complex sclerosing lesion versus intraductal papilloma. Surgical excision is recommended.  On examination, she has significant bruising and hematoma in the lower part of her right breast.  The left breast examination is unchanged.   Filed Vitals:   10/27/11 0848  BP: 154/90  Pulse: 72  Temp: 98.2 F (36.8 C)  Resp: 18     We discussed the patient's case in breast conference. I spent some time discussing the options with the patient. She has already come to a decision. She would like bilateral mastectomies. Considering her presentation with locally advanced disease I believe that this is a reasonable option. She will likely need radiation postoperatively so I would not recommend immediate reconstruction. We will perform a left modified radical mastectomy, a right simple mastectomy, and we'll place a right subclavian vein port.  The surgical procedure has been discussed with the patient.  Potential risks, benefits, alternative treatments, and expected outcomes have been explained.  All of the patient's questions at this time have been answered.  The likelihood of reaching the patient's treatment goal is good.  The patient understand the proposed surgical procedure and wishes to proceed.    Previous note: 09/01/11 HPI [name] Riley is a 60 y.o. female.  The patient presents with a four-month history of a palpable mass in her left lower outer quadrant of her breast. She underwent mammogram and ultrasound. Subsequently she underwent ultrasound-guided biopsy of the mass as well  as a palpable lymph node. 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 The prognostic profile is pending. The lymph node showed metastatic carcinoma. On November 13, she underwent breast MRI. This confirmed the 3.5 x 2.7 x 1.7 invasive ductal carcinoma at 3:00 and left breast. She does have left axillary lymphadenopathy. She also has several other abnormally enhancing left breast masses including some thickening of the skin with distraction of the nipple. The right breast shows several areas of suspicious abnormal enhancement. She also has some abnormal findings in the sternum and the manubrium. She is referred today for surgical evaluation. She has an appointment next week with Dr. Peter Rubin for oncology.HPI  Past Medical History  Diagnosis Date  . Coronary artery disease   . Heart attack 09/2000    pt coded during this process  . Hyperlipidemia   . Hypertension     Past Surgical History  Procedure Date  . Abdominal hysterectomy     1998  . Tonsillectomy   . Ovarian cyst surgery 1970  . Skin tags 05/09/1997    left axillary left neck skin tags  . Breast surgery 1998    removal of benign lump in rt breast    Family History  Problem Relation Age of Onset  . Hypertension Maternal Grandmother   . Diabetes Maternal Grandmother   . Cancer Father 78    lung cancer  . Hypertension Mother   . Cancer Paternal Aunt     ovarian    Social History History  Substance Use Topics  . Smoking status: Never Smoker   . Smokeless tobacco: Never   Used  . Alcohol Use: No    Allergies  Allergen Reactions  . Contrast Media (Iodinated Diagnostic Agents) Rash    Current Outpatient Prescriptions  Medication Sig Dispense Refill  . aspirin 81 MG tablet Take 81 mg by mouth daily.        . fish oil-omega-3 fatty acids 1000 MG capsule Take 2 g by mouth daily.        . levothyroxine (SYNTHROID, LEVOTHROID) 75 MCG tablet Take 75 mcg by mouth  daily.        . Multiple Vitamins-Minerals (MULTIVITAMIN WITH MINERALS) tablet Take 1 tablet by mouth daily.        . nebivolol (BYSTOLIC) 10 MG tablet Take 10 mg by mouth daily.          Review of Systems Review of Systems  Constitutional: Positive for chills. Negative for fever and unexpected weight change.  HENT: Positive for congestion and voice change. Negative for hearing loss, sore throat and trouble swallowing.   Eyes: Negative for visual disturbance.  Respiratory: Positive for wheezing. Negative for cough.   Cardiovascular: Negative for chest pain, palpitations and leg swelling.  Gastrointestinal: Negative for nausea, vomiting, abdominal pain, diarrhea, constipation, blood in stool, abdominal distention and anal bleeding.  Genitourinary: Negative for hematuria, vaginal bleeding and difficulty urinating.  Musculoskeletal: Negative for arthralgias.  Skin: Negative for rash and wound.  Neurological: Positive for headaches. Negative for seizures and syncope.  Hematological: Negative for adenopathy. Does not bruise/bleed easily.  Psychiatric/Behavioral: Negative for confusion.  Menarche - 14 First pregnancy - 22 Breast feeding - no Hormones - several years Menopause - at the time of hysterectomy  Blood pressure 158/102, pulse 72, temperature 98.4 F (36.9 C), temperature source Temporal, resp. rate 16, height 5' 8" (1.727 m), weight 184 lb 12.8 oz (83.825 kg).  Physical Exam Physical Exam WDWN in NAD HEENT:  EOMI, sclera anicteric Neck:  No masses, no thyromegaly Lungs:  CTA bilaterally; normal respiratory effort; no wheezing noted Breasts - slight nipple retraction laterally in the left breast, no right nipple retraction No dominant masses in the right breast; no axillary lymphadenopathy Left breast - lower outer quadrant - large 3-4 cm palpable firm mass, fixated to overlying skin; palpable lymph node in the axilla CV:  Regular rate and rhythm; no murmurs Abd:  +bowel  sounds, soft, non-tender, no masses Ext:  Well-perfused; no edema Skin:  Warm, dry; no sign of jaundice  Data Reviewed Mammogram/ ultrasound/ MRI  Assessment    Metastatic breast cancer - left breast with positive axillary lymph node.  Incompletely staged - if metastatic work-up is negative, then she will need further core biopsies of the right breast.    Plan    She has an appointment to see Dr. Rubin next week to discuss neoadjuvant therapy.  I went ahead and counseled her regarding port placement.  The surgical procedure has been discussed with the patient.  Potential risks, benefits, alternative treatments, and expected outcomes have been explained.  All of the patient's questions at this time have been answered.  The likelihood of reaching the patient's treatment goal is good.  The patient understand the proposed surgical procedure and wishes to proceed.  I spent a considerable amount of time (60 min) with the patient and her husband discussing the MRI findings and expected course of treatment.  They are obviously quite shocked with the possibility that she might have bilateral multicentric disease with possible bone metastases.  They understand the need for neoadjuvant therapy   before planning any type of surgical resection.      Ramone Gander K. Lavaris Sexson, MD, FACS [facility]  10/27/2011 2:12 PM  

## 2011-11-14 NOTE — Anesthesia Preprocedure Evaluation (Signed)
Anesthesia Evaluation  Patient identified by MRN, date of birth, ID band Patient awake    Reviewed: Allergy & Precautions, H&P , NPO status , Patient's Chart, lab work & pertinent test results  History of Anesthesia Complications (+) PONV  Airway Mallampati: II  Neck ROM: full    Dental   Pulmonary          Cardiovascular hypertension, + CAD and + Past MI     Neuro/Psych    GI/Hepatic   Endo/Other  Hypothyroidism   Renal/GU      Musculoskeletal   Abdominal   Peds  Hematology   Anesthesia Other Findings   Reproductive/Obstetrics                           Anesthesia Physical Anesthesia Plan  ASA: III  Anesthesia Plan: General   Post-op Pain Management:    Induction: Intravenous  Airway Management Planned:   Additional Equipment:   Intra-op Plan:   Post-operative Plan: Extubation in OR  Informed Consent: I have reviewed the patients History and Physical, chart, labs and discussed the procedure including the risks, benefits and alternatives for the proposed anesthesia with the patient or authorized representative who has indicated his/her understanding and acceptance.     Plan Discussed with: CRNA and Surgeon  Anesthesia Plan Comments:         Anesthesia Quick Evaluation

## 2011-11-15 ENCOUNTER — Encounter (HOSPITAL_COMMUNITY): Payer: Self-pay | Admitting: Surgery

## 2011-11-15 MED FILL — Dextrose 5% w/ Sodium Chloride 0.45%: INTRAVENOUS | Qty: 1000 | Status: AC

## 2011-11-15 MED FILL — Hydromorphone HCl Inj 1 MG/ML: INTRAMUSCULAR | Qty: 1 | Status: AC

## 2011-11-15 NOTE — Progress Notes (Signed)
1 Day Post-Op  Subjective: Patient had significant nausea last night, but is feeling better today.  Pain is 3/10 and is managed well with IV Dilaudid.  Has not tried Percocet yet.  Objective: Vital signs in last 24 hours: Temp:  [98.1 F (36.7 C)-98.9 F (37.2 C)] 98.9 F (37.2 C) (01/29 0520) Pulse Rate:  [66-83] 75  (01/29 0520) Resp:  [14-20] 18  (01/29 0520) BP: (114-168)/(57-94) 123/69 mmHg (01/29 0520) SpO2:  [6 %-98 %] 98 % (01/29 0520) Last BM Date: 11/14/11  Intake/Output from previous day: 01/28 0701 - 01/29 0700 In: 3920 [I.V.:3665] Out: 675 [Urine:600; Blood:75] Intake/Output this shift: Total I/O In: 50 [IV Piggyback:50] Out: -   WDWN in NAD Chest - no significant hematoma; flaps viable Dressing dry -  JP output - serosanguinous   Lab Results:  No results found for this basename: WBC:2,HGB:2,HCT:2,PLT:2 in the last 72 hours BMET No results found for this basename: NA:2,K:2,CL:2,CO2:2,GLUCOSE:2,BUN:2,CREATININE:2,CALCIUM:2 in the last 72 hours PT/INR No results found for this basename: LABPROT:2,INR:2 in the last 72 hours ABG No results found for this basename: PHART:2,PCO2:2,PO2:2,HCO3:2 in the last 72 hours  Studies/Results: Dg Chest Port 1 View  11/14/2011  *RADIOLOGY REPORT*  Clinical Data: 61 year old female with history of breast cancer. Port-A-Cath placement.  PORTABLE CHEST - 1 VIEW  Comparison: 11/07/2011  Findings: A right subclavian Port-A-Cath is identified with tip overlying the cavoatrial junction. The catheter has slightly angulated courses just beyond report. There is no evidence of pneumothorax. Postoperative changes from breast surgery identified. No pleural effusions are present.  IMPRESSION: Right Port-A-Cath as described with tip overlying the cavoatrial junction.  No evidence of pneumothorax.  Original Report Authenticated By: Rosendo Gros, M.D.   Dg Fluoro Guide Cv Line-no Report  11/14/2011  CLINICAL DATA: Port placement   FLOURO GUIDE  CV LINE  Fluoroscopy was utilized by the requesting physician.  No radiographic  interpretation.      Anti-infectives: Anti-infectives     Start     Dose/Rate Route Frequency Ordered Stop   11/14/11 1900   ceFAZolin (ANCEF) IVPB 1 g/50 mL premix        1 g 100 mL/hr over 30 Minutes Intravenous Every 6 hours 11/14/11 1739 11/15/11 0810   11/13/11 1545   ceFAZolin (ANCEF) IVPB 2 g/50 mL premix        2 g 100 mL/hr over 30 Minutes Intravenous 60 min pre-op 11/13/11 1530 11/14/11 1108   11/13/11 1000   ceFAZolin (ANCEF) IVPB 1 g/50 mL premix  Status:  Discontinued     Comments: To OR      1 g 100 mL/hr over 30 Minutes Intravenous 60 min pre-op 11/13/11 1610 11/13/11 1531          Assessment/Plan: s/p Procedure(s): MASTECTOMY MODIFIED RADICAL - left Simple mastectomy - right INSERTION PORT-A-CATH Advance diet Plan for discharge tomorrow Ambulate - PO pain meds - JP drain education  LOS: 1 day    [name] Riley K. 11/15/2011

## 2011-11-16 MED ORDER — OXYCODONE-ACETAMINOPHEN 5-325 MG PO TABS: 1.0000 | ORAL_TABLET | ORAL | Status: AC | PRN

## 2011-11-16 NOTE — Discharge Summary (Signed)
Physician Discharge Summary  Patient ID: [name] Riley MRN: [id number] DOB/AGE: May 02, 1951 61 y.o.  Admit date: 11/14/2011 Discharge date: 11/16/2011  Admission Diagnoses:  Discharge Diagnoses:  Active Problems:  * No active hospital problems. *    Discharged Condition: good  Hospital Course: Right port placement/ right simple mastectomy/ left modified radical mastectomy on 11/14/11.  Post-op nausea and pain control - improved dramatically on POD #1.  Consults: None  Significant Diagnostic Studies: none  Treatments: surgery: see above  Discharge Exam: Blood pressure 180/74, pulse 81, temperature 97.9 F (36.6 C), temperature source Oral, resp. rate 16, SpO2 95.00%. General appearance: alert, cooperative and no distress Incisions - staple lines intact; skin flaps viable; serosanguinous drainage  Disposition:   Discharge Orders    Future Appointments: Provider: Department: Dept Phone: Center:   12/08/2011 11:30 AM Stephanie Acre [facility] [phone] None   12/08/2011 12:00 PM Pierce Crane, MD [facility] [phone] None     Future Orders Please Complete By Expires   Diet general      Discharge instructions      Comments:      [phone]  MASTECTOMY: POST OP INSTRUCTIONS  Always review your discharge instruction sheet given to you by the facility where your surgery was performed. IF YOU HAVE DISABILITY OR FAMILY LEAVE FORMS, YOU MUST BRING THEM TO THE OFFICE FOR PROCESSING.   DO NOT GIVE THEM TO YOUR DOCTOR. A prescription for pain medication may be given to you upon discharge.  Take your pain medication as prescribed, if needed.  If narcotic pain medicine is not needed, then you may take acetaminophen (Tylenol) or ibuprofen (Advil) as needed. Take your usually prescribed medications unless otherwise directed. If you need a refill on your pain medication, please contact your pharmacy.  They will contact our office to request authorization.  Prescriptions will not  be filled after 5pm or on week-ends. You should follow a light diet the first few days after arrival home, such as soup and crackers, etc.  Resume your normal diet the day after surgery. Most patients will experience some swelling and bruising on the chest and underarm.  Ice packs will help.  Swelling and bruising can take several days to resolve.  It is common to experience some constipation if taking pain medication after surgery.  Increasing fluid intake and taking a stool softener (such as Colace) will usually help or prevent this problem from occurring.  A mild laxative (Milk of Magnesia or Miralax) should be taken according to package instructions if there are no bowel movements after 48 hours. Unless discharge instructions indicate otherwise, leave your bandage dry and in place until your next appointment in 3-5 days.  You may take a limited sponge bath.  No tube baths or showers until the drains are removed.   Any sutures or staples will be removed at the office during your follow-up visit. DRAINS:  If you have drains in place, it is important to keep a list of the amount of drainage produced each day in your drains.  Before leaving the hospital, you should be instructed on drain care.  Call our office if you have any questions about your drains. ACTIVITIES:  You may resume regular (light) daily activities beginning the next day-such as daily self-care, walking, climbing stairs-gradually increasing activities as tolerated.  You may have sexual intercourse when it is comfortable.  Refrain from any heavy lifting or straining until approved by your doctor. You may drive when you are no longer taking  prescription pain medication, you can comfortably wear a seatbelt, and you can safely maneuver your car and apply brakes. RETURN TO WORK:  __________________________________________________________ Bonita Quin should see your doctor in the office for a follow-up appointment approximately 3-5 days after your  surgery.  Your doctor's nurse will typically make your follow-up appointment when she calls you with your pathology report.  Expect your pathology report 2-3 business days after your surgery.  You may call to check if you do not hear from [name] after three days.   OTHER INSTRUCTIONS: ______________________________________________________________________________________________ ____________________________________________________________________________________________ WHEN TO CALL YOUR DOCTOR: Fever over 101.0 Nausea and/or vomiting Extreme swelling or bruising Continued bleeding from incision. Increased pain, redness, or drainage from the incision. The clinic staff is available to answer your questions during regular business hours.  Please don't hesitate to call and ask to speak to one of the nurses for clinical concerns.  If you have a medical emergency, go to the nearest emergency room or call 911.  A surgeon from [facility] is always on call at the hospital. [address], [place], [name]  [address]  [address] [address], [place], [name]   [id number] [phone]  FAX [phone]    Increase activity slowly      May walk up steps      May shower / Bathe      Driving Restrictions      Comments:   Do not drive while taking pain medications   Call MD for:  temperature >100.4      Call MD for:  persistant nausea and vomiting      Call MD for:  severe uncontrolled pain      Call MD for:  redness, tenderness, or signs of infection (pain, swelling, redness, odor or green/yellow discharge around incision site)        Medication List  As of 11/16/2011  7:53 AM   TAKE these medications         aspirin 81 MG tablet   Take 81 mg by mouth daily.      fish oil-omega-3 fatty acids 1000 MG capsule   Take 2 g by mouth at bedtime.      levothyroxine 75 MCG tablet   Commonly known as: SYNTHROID, LEVOTHROID   Take 75 mcg by mouth daily.      metoprolol  succinate 100 MG 24 hr tablet   Commonly known as: TOPROL-XL   Take 100 mg by mouth daily.      multivitamin with minerals tablet   Take 1 tablet by mouth daily.      oxyCODONE-acetaminophen 5-325 MG per tablet   Commonly known as: PERCOCET   Take 1-2 tablets by mouth every 4 (four) hours as needed.             Signed: Junnie Loschiavo K. 11/16/2011, 7:53 AM

## 2011-11-16 NOTE — Progress Notes (Signed)
2 Days Post-Op  Subjective: Feels much better today.  Tolerating regular diet without difficulty.  Took only one pain pill yesterday.   Pathology is still pending  Objective: Vital signs in last 24 hours: Temp:  [97.9 F (36.6 C)-100 F (37.8 C)] 97.9 F (36.6 C) (01/30 1610) Pulse Rate:  [81-93] 81  (01/30 0608) Resp:  [16-20] 16  (01/30 0608) BP: (116-180)/(63-74) 180/74 mmHg (01/30 0608) SpO2:  [95 %-97 %] 95 % (01/30 0608) Last BM Date: 11/14/11  Intake/Output from previous day: 01/29 0701 - 01/30 0700 In: 1964.2 [P.O.:720; I.V.:1104.2; IV Piggyback:50] Out: 135 [Drains:135] Intake/Output this shift:  JP output is serosanguinous from all three drains    General appearance: alert, cooperative and no distress Chest wall: no tenderness, skin flaps viable with no obvious seroma/hematoma Staple lines are intact with minimal drainage on dressings Left axilla - sore; limited ROM due to discomfort  Lab Results:  No results found for this basename: WBC:2,HGB:2,HCT:2,PLT:2 in the last 72 hours BMET No results found for this basename: NA:2,K:2,CL:2,CO2:2,GLUCOSE:2,BUN:2,CREATININE:2,CALCIUM:2 in the last 72 hours PT/INR No results found for this basename: LABPROT:2,INR:2 in the last 72 hours ABG No results found for this basename: PHART:2,PCO2:2,PO2:2,HCO3:2 in the last 72 hours  Studies/Results: Dg Chest Port 1 View  11/14/2011  *RADIOLOGY REPORT*  Clinical Data: 61 year old female with history of breast cancer. Port-A-Cath placement.  PORTABLE CHEST - 1 VIEW  Comparison: 11/07/2011  Findings: A right subclavian Port-A-Cath is identified with tip overlying the cavoatrial junction. The catheter has slightly angulated courses just beyond report. There is no evidence of pneumothorax. Postoperative changes from breast surgery identified. No pleural effusions are present.  IMPRESSION: Right Port-A-Cath as described with tip overlying the cavoatrial junction.  No evidence of  pneumothorax.  Original Report Authenticated By: Rosendo Gros, M.D.   Dg Fluoro Guide Cv Line-no Report  11/14/2011  CLINICAL DATA: Port placement   FLOURO GUIDE CV LINE  Fluoroscopy was utilized by the requesting physician.  No radiographic  interpretation.      Anti-infectives: Anti-infectives     Start     Dose/Rate Route Frequency Ordered Stop   11/14/11 1900   ceFAZolin (ANCEF) IVPB 1 g/50 mL premix        1 g 100 mL/hr over 30 Minutes Intravenous Every 6 hours 11/14/11 1739 11/15/11 0810   11/13/11 1545   ceFAZolin (ANCEF) IVPB 2 g/50 mL premix        2 g 100 mL/hr over 30 Minutes Intravenous 60 min pre-op 11/13/11 1530 11/14/11 1108   11/13/11 1000   ceFAZolin (ANCEF) IVPB 1 g/50 mL premix  Status:  Discontinued     Comments: To OR      1 g 100 mL/hr over 30 Minutes Intravenous 60 min pre-op 11/13/11 9604 11/13/11 1531          Assessment/Plan: s/p Procedure(s): MASTECTOMY MODIFIED RADICAL - left Right simple mastectomy INSERTION PORT-A-CATH Discharge  LOS: 2 days    [name] Riley K. 11/16/2011

## 2011-11-16 NOTE — Progress Notes (Signed)
UR of chart completed.  

## 2011-11-18 ENCOUNTER — Encounter (INDEPENDENT_AMBULATORY_CARE_PROVIDER_SITE_OTHER): Payer: Self-pay | Admitting: Surgery

## 2011-11-18 ENCOUNTER — Ambulatory Visit (INDEPENDENT_AMBULATORY_CARE_PROVIDER_SITE_OTHER): Payer: Medicaid Other | Admitting: Surgery

## 2011-11-18 VITALS — BP 168/93 | HR 77 | Temp 98.6°F | Resp 18 | Ht 68.0 in | Wt 180.2 lb

## 2011-11-18 DIAGNOSIS — C50912 Malignant neoplasm of unspecified site of left female breast: Secondary | ICD-10-CM

## 2011-11-18 DIAGNOSIS — C50919 Malignant neoplasm of unspecified site of unspecified female breast: Secondary | ICD-10-CM

## 2011-11-18 NOTE — Progress Notes (Signed)
S/p right subclavian port placement/ right simple mastectomy/ left MRM on 11/14/11.  She was discharged on 11/16/11.  The drains are still putting out too much for removal.  The drainage appears serosanguinous.  The skin flaps are all viable with no sign of infection.  The staple lines are intact with no drainage.    The pathology report shows no malignancy in the right breast.  The left breast cancer was 4.3 cm with 3/18 metastatic lymph nodes.  ER+/ PR +/ Her2 -/ Ki67 79%.  We will leave the drains in place for now and will see her again on Monday.  She may remove the breast binder and may use the post-mastectomy camisole.  Wilmon Arms. [name], MD, [facility]  11/18/2011 2:32 PM

## 2011-11-21 ENCOUNTER — Encounter (INDEPENDENT_AMBULATORY_CARE_PROVIDER_SITE_OTHER): Payer: Self-pay | Admitting: General Surgery

## 2011-11-21 ENCOUNTER — Ambulatory Visit (INDEPENDENT_AMBULATORY_CARE_PROVIDER_SITE_OTHER): Payer: Medicaid Other | Admitting: General Surgery

## 2011-11-21 VITALS — BP 162/110 | Wt 180.0 lb

## 2011-11-21 DIAGNOSIS — Z09 Encounter for follow-up examination after completed treatment for conditions other than malignant neoplasm: Secondary | ICD-10-CM

## 2011-11-21 NOTE — Progress Notes (Signed)
Subjective:     Patient ID: [name] Riley, female   DOB: 1951-03-07, 61 y.o.   MRN: [id number]  HPI This is a 61 year old female patient of Dr. Fatima Sanger who recently underwent a right simple mastectomy and a left modified radical mastectomy. She is doing well. She has no significant complaints. Her blood pressure is fairly high today. She is asymptomatic from that. Her drains are all putting out over 30 cc per day except for #2 which is on the left side. Review of Systems     Objective:   Physical Exam Healing incisions bilaterally with staples in place, drains with serosang fluid present, no infection, she has a couple small blisters on skin likely tape related left lateral incision    Assessment:     S/p right sm and left mrm    Plan:     I removed one left sided drain.  She will return Thursday to see Dr. [name].  I gave her some exercises as her left arm is pretty tight today.  She is going to check bp at home today/tomorrow and if remains high will call back.

## 2011-11-24 ENCOUNTER — Encounter (INDEPENDENT_AMBULATORY_CARE_PROVIDER_SITE_OTHER): Payer: Self-pay | Admitting: Surgery

## 2011-11-24 ENCOUNTER — Ambulatory Visit (INDEPENDENT_AMBULATORY_CARE_PROVIDER_SITE_OTHER): Payer: Medicaid Other | Admitting: Surgery

## 2011-11-24 VITALS — BP 138/89 | HR 84 | Temp 98.2°F | Resp 22 | Ht 68.0 in | Wt 178.2 lb

## 2011-11-24 DIAGNOSIS — C50919 Malignant neoplasm of unspecified site of unspecified female breast: Secondary | ICD-10-CM

## 2011-11-24 DIAGNOSIS — C50912 Malignant neoplasm of unspecified site of left female breast: Secondary | ICD-10-CM

## 2011-11-24 NOTE — Progress Notes (Signed)
The right side drain is removed today.  The left axillary drain still has too much drainage for removal (about 70 cc yesterday).  I milked a clot out of the drainage tube.  Both incisions are healing well and the staples were removed today.  We replaced these with steri-strips.  The medial left drain site has closed.  Return next week to hopefully remove the last drain.  Wilmon Arms. [name], MD, [facility]  11/24/2011 11:15 AM

## 2011-11-29 ENCOUNTER — Ambulatory Visit (INDEPENDENT_AMBULATORY_CARE_PROVIDER_SITE_OTHER): Payer: Medicaid Other | Admitting: Surgery

## 2011-11-29 ENCOUNTER — Encounter (INDEPENDENT_AMBULATORY_CARE_PROVIDER_SITE_OTHER): Payer: Self-pay | Admitting: Surgery

## 2011-11-29 VITALS — BP 146/96 | HR 72 | Temp 98.7°F | Resp 18 | Ht 68.0 in | Wt 181.0 lb

## 2011-11-29 DIAGNOSIS — C50919 Malignant neoplasm of unspecified site of unspecified female breast: Secondary | ICD-10-CM

## 2011-11-29 DIAGNOSIS — C50912 Malignant neoplasm of unspecified site of left female breast: Secondary | ICD-10-CM

## 2011-11-29 NOTE — Progress Notes (Signed)
She comes in today for removal of her left axillary drain.  She has averaged about 30 ml of serous output each of the last two days.  Both incisions are healing well with no sign of infection or seroma.  She is developed a lot of sensitivity under the skin flaps as the muscle begins to heal. She continues to do her daily exercises.  The drain is removed without difficulty.  Dry dressing applied.  Follow-up in 2 weeks.  Continue daily exercises.  Wilmon Arms. [name], MD, [facility]  11/29/2011 1:27 PM

## 2011-12-08 ENCOUNTER — Encounter: Payer: Self-pay | Admitting: *Deleted

## 2011-12-08 ENCOUNTER — Telehealth: Payer: Self-pay | Admitting: Oncology

## 2011-12-08 ENCOUNTER — Ambulatory Visit (HOSPITAL_BASED_OUTPATIENT_CLINIC_OR_DEPARTMENT_OTHER): Payer: Medicaid Other | Admitting: Oncology

## 2011-12-08 ENCOUNTER — Other Ambulatory Visit: Payer: Medicaid Other | Admitting: Lab

## 2011-12-08 VITALS — BP 175/98 | HR 76 | Temp 98.0°F | Ht 68.0 in | Wt 182.0 lb

## 2011-12-08 DIAGNOSIS — C50919 Malignant neoplasm of unspecified site of unspecified female breast: Secondary | ICD-10-CM

## 2011-12-08 DIAGNOSIS — C50912 Malignant neoplasm of unspecified site of left female breast: Secondary | ICD-10-CM

## 2011-12-08 LAB — COMPREHENSIVE METABOLIC PANEL
ALT: 48 U/L — ABNORMAL HIGH (ref 0–35)
AST: 38 U/L — ABNORMAL HIGH (ref 0–37)
Albumin: 4.5 g/dL (ref 3.5–5.2)
Alkaline Phosphatase: 115 U/L (ref 39–117)
BUN: 14 mg/dL (ref 6–23)
CO2: 26 mEq/L (ref 19–32)
Calcium: 10 mg/dL (ref 8.4–10.5)
Chloride: 105 mEq/L (ref 96–112)
Creatinine, Ser: 0.73 mg/dL (ref 0.50–1.10)
Glucose, Bld: 103 mg/dL — ABNORMAL HIGH (ref 70–99)
Potassium: 3.6 mEq/L (ref 3.5–5.3)
Sodium: 140 mEq/L (ref 135–145)
Total Bilirubin: 0.5 mg/dL (ref 0.3–1.2)
Total Protein: 7.3 g/dL (ref 6.0–8.3)

## 2011-12-08 LAB — CBC WITH DIFFERENTIAL/PLATELET
BASO%: 0.9 % (ref 0.0–2.0)
Basophils Absolute: 0.1 10*3/uL (ref 0.0–0.1)
EOS%: 1.7 % (ref 0.0–7.0)
Eosinophils Absolute: 0.1 10*3/uL (ref 0.0–0.5)
HCT: 40.1 % (ref 34.8–46.6)
HGB: 13.9 g/dL (ref 11.6–15.9)
LYMPH%: 32.2 % (ref 14.0–49.7)
MCH: 31.3 pg (ref 25.1–34.0)
MCHC: 34.6 g/dL (ref 31.5–36.0)
MCV: 90.3 fL (ref 79.5–101.0)
MONO#: 0.5 10*3/uL (ref 0.1–0.9)
MONO%: 8 % (ref 0.0–14.0)
NEUT#: 3.5 10*3/uL (ref 1.5–6.5)
NEUT%: 57.2 % (ref 38.4–76.8)
Platelets: 253 10*3/uL (ref 145–400)
RBC: 4.44 10*6/uL (ref 3.70–5.45)
RDW: 12.3 % (ref 11.2–14.5)
WBC: 6.2 10*3/uL (ref 3.9–10.3)
lymph#: 2 10*3/uL (ref 0.9–3.3)

## 2011-12-08 LAB — LACTATE DEHYDROGENASE: LDH: 198 U/L (ref 94–250)

## 2011-12-08 LAB — CANCER ANTIGEN 27.29: CA 27.29: 54 U/mL — ABNORMAL HIGH (ref 0–39)

## 2011-12-08 LAB — CEA: CEA: 0.8 ng/mL (ref 0.0–5.0)

## 2011-12-08 NOTE — Progress Notes (Signed)
Hematology and Oncology Follow Up Visit  LOREAN EKSTRAND [id number] [birth year] 61 y.o. 12/08/2011 3:52 PM PCP  Principle Diagnosis: ER/PR positive breast cancer and 61 year old woman  Interim History:  There have been no intercurrent illness, hospitalizations or medication changes. Patient has undergone bilateral mastectomies on 11/06/2011. Her last pain has been removed. She has had a port placed. She has had chemotherapy teaching. Final pathology showed on the right side fibrous cystic changes and no evidence of cancer, left-sided showed a 4.3 cm grade 3 invasive ductal cancer with focally involved margin anteriorly deep margin is free of 18 lymph nodes were removed 3 of which had metastatic disease some with extracapsular extension. ER +89%, PR +81% HER-2 negative Ki-67 79%. Medications: I have reviewed the patient's current medications.  Allergies:  Allergies  Allergen Reactions  . Contrast Media (Iodinated Diagnostic Agents) Rash    Past Medical History, Surgical history, Social history, and Family History were reviewed and updated.  Review of Systems: Constitutional:  Negative for fever, chills, night sweats, anorexia, weight loss, pain. Cardiovascular: no chest pain or dyspnea on exertion Respiratory: negative Neurological: negative Dermatological: negative ENT: negative Skin Gastrointestinal: negative Genito-Urinary: negative Hematological and Lymphatic: negative Breast: negative Musculoskeletal: negative Remaining ROS negative.  Physical Exam: Blood pressure 175/98, pulse 76, temperature 98 F (36.7 C), height 5\' 8"  (1.727 m), weight 182 lb (82.555 kg). ECOG:  General appearance: alert, cooperative and appears stated age Limited exam in today to the chest wall shows healing scars with no obvious evidence of infection. Port site looks normal. Lab Results: Lab Results  Component Value Date   WBC 6.2 12/08/2011   HGB 13.9 12/08/2011   HCT 40.1 12/08/2011   MCV 90.3  12/08/2011   PLT 253 12/08/2011     Chemistry      Component Value Date/Time   NA 140 12/08/2011 1117   K 3.6 12/08/2011 1117   CL 105 12/08/2011 1117   CO2 26 12/08/2011 1117   BUN 14 12/08/2011 1117   CREATININE 0.73 12/08/2011 1117      Component Value Date/Time   CALCIUM 10.0 12/08/2011 1117   ALKPHOS 115 12/08/2011 1117   AST 38* 12/08/2011 1117   ALT 48* 12/08/2011 1117   BILITOT 0.5 12/08/2011 1117      .pathology. Radiological Studies: chest X-ray NA Mammogram NA Bone density na  Impression and Plan:  The patient is a healthy 61 year old woman. 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 She is not having and the long-term sequelae from this and she is no longer being followed by cardiology. She does have some hypertension. We spent about 30-45 minutes today discussing adjuvant chemotherapy and its indications. I given her multinode nature of her cancer I think adjuvant chemotherapy is indicated. I discussed options of enrollment in the B. 49 protocol which is a randomized trial comparing 6 cycles of TC chemotherapy with an anthracycline-based approach. Off study I would ttreat with FEC regimen for 4 cycles followed by Taxotere for 4 cycles. I have taken the liberty of referring her back to Dr. Antoine Poche  for a 2-D echo I have also contacted him to get his thoughts regarding her eligibility for anthracycline use . I plan to see her next week for followup after she's had a 2-D echo and that did finally get her thoughts on clinical trial enrollment. More than 50% of the visit was spent in patient-related counselling   Pierce Crane, MD 2/21/20133:52 PM

## 2011-12-08 NOTE — Telephone Encounter (Signed)
gve the pt her feb 2013 appt calendar along with the echo appt

## 2011-12-13 ENCOUNTER — Encounter (INDEPENDENT_AMBULATORY_CARE_PROVIDER_SITE_OTHER): Payer: Self-pay | Admitting: Surgery

## 2011-12-13 ENCOUNTER — Encounter: Payer: Self-pay | Admitting: *Deleted

## 2011-12-13 ENCOUNTER — Telehealth: Payer: Self-pay | Admitting: *Deleted

## 2011-12-13 ENCOUNTER — Ambulatory Visit (INDEPENDENT_AMBULATORY_CARE_PROVIDER_SITE_OTHER): Payer: Medicaid Other | Admitting: Surgery

## 2011-12-13 ENCOUNTER — Encounter: Payer: Self-pay | Admitting: Cardiology

## 2011-12-13 ENCOUNTER — Other Ambulatory Visit: Payer: Medicaid Other

## 2011-12-13 VITALS — BP 142/86 | HR 72 | Temp 98.4°F | Resp 16 | Ht 68.0 in | Wt 180.4 lb

## 2011-12-13 DIAGNOSIS — C50912 Malignant neoplasm of unspecified site of left female breast: Secondary | ICD-10-CM

## 2011-12-13 DIAGNOSIS — C50919 Malignant neoplasm of unspecified site of unspecified female breast: Secondary | ICD-10-CM

## 2011-12-13 MED ORDER — OXYCODONE-ACETAMINOPHEN 5-325 MG PO TABS: 1.0000 | ORAL_TABLET | ORAL | Status: AC | PRN

## 2011-12-13 NOTE — Telephone Encounter (Signed)
Pt. Received call from RT to do appt with Dr. Mitzi Hansen.  Pt is wondering why she would need to see him.  Discussed  with Dr. Donnie Coffin:  Would like pt to see him to evaluate need for chest wall radiation given the number of lymph nodes.  Discussed with patient and she verbalized understanding and will call Clydie Braun back to schedule an appt. With Dr. Mitzi Hansen.

## 2011-12-13 NOTE — Progress Notes (Signed)
The patient returns for another post-op visit.  She has met with Dr. Donnie Coffin to plan her chemotherapeutic regimen, and she is undergoing a 2-D echo to evaluate her cardiac status.  Her incisions seem to be healing well.  Her main complaint is occasional sharp pain in her left axilla and her left shoulder.  She has an area of numbness in the medial upper arm, but this is slowly improving.  She states that she will get sharp twinges of pain in her left axilla shooting back into her posterior shoulder on occasion.  This seems to get worse later in the day.  Filed Vitals:   12/13/11 1538  BP: 142/86  Pulse: 72  Temp: 98.4 F (36.9 C)  Resp: 16    On examination, the patient seems to be in no distress Her left trapezius/ shoulder seem to be elevated/ thicker when compared to the right side.  Both scapulae seem to be symmetric.  She is able to raise her left arm, but feels tightness and "pulling" in the axilla.   Both incisions are healing well with no sign of infection or seroma.  The right incision seems mildly thickened at its lateral end, but no fluctuance or infection noted.  The left incision is completely healed with no sign of infection. Port site OK  1.  S/p bilateral mastectomies/ left lymph node dissection 2.  Possible left shoulder limited ROM - nerve damage/ contusion from axillary dissection 3.  Wounds healing well with no residual seroma  Plan:  Physical therapy consult to help with ROM and stiffness in her left shoulder Chemotherapy per Dr. Donnie Coffin Follow-up 1 month.  Wilmon Arms. [name], MD, [facility]  12/13/2011 5:36 PM

## 2011-12-15 ENCOUNTER — Ambulatory Visit (HOSPITAL_COMMUNITY)
Admission: RE | Admit: 2011-12-15 | Discharge: 2011-12-15 | Disposition: A | Discharge status: Home / Self Care | Payer: Medicaid Other | Source: Ambulatory Visit | Attending: Oncology | Admitting: Oncology

## 2011-12-15 DIAGNOSIS — I252 Old myocardial infarction: Secondary | ICD-10-CM | POA: Insufficient documentation

## 2011-12-15 DIAGNOSIS — I059 Rheumatic mitral valve disease, unspecified: Secondary | ICD-10-CM

## 2011-12-15 DIAGNOSIS — I10 Essential (primary) hypertension: Secondary | ICD-10-CM | POA: Insufficient documentation

## 2011-12-15 DIAGNOSIS — Z8249 Family history of ischemic heart disease and other diseases of the circulatory system: Secondary | ICD-10-CM | POA: Insufficient documentation

## 2011-12-15 DIAGNOSIS — C50912 Malignant neoplasm of unspecified site of left female breast: Secondary | ICD-10-CM

## 2011-12-15 DIAGNOSIS — C50919 Malignant neoplasm of unspecified site of unspecified female breast: Secondary | ICD-10-CM | POA: Insufficient documentation

## 2011-12-15 DIAGNOSIS — I080 Rheumatic disorders of both mitral and aortic valves: Secondary | ICD-10-CM | POA: Insufficient documentation

## 2011-12-15 NOTE — Progress Notes (Signed)
  Echocardiogram 2D Echocardiogram has been performed.  Cathie Beams Deneen 12/15/2011, 10:06 AM

## 2011-12-16 ENCOUNTER — Encounter: Payer: Self-pay | Admitting: Radiation Oncology

## 2011-12-16 ENCOUNTER — Encounter: Payer: Self-pay | Admitting: *Deleted

## 2011-12-16 ENCOUNTER — Ambulatory Visit
Admission: RE | Admit: 2011-12-16 | Discharge: 2011-12-16 | Disposition: A | Discharge status: Home / Self Care | Payer: Medicaid Other | Source: Ambulatory Visit | Attending: Radiation Oncology | Admitting: Radiation Oncology

## 2011-12-16 ENCOUNTER — Ambulatory Visit (HOSPITAL_BASED_OUTPATIENT_CLINIC_OR_DEPARTMENT_OTHER): Payer: Medicaid Other | Admitting: Oncology

## 2011-12-16 ENCOUNTER — Telehealth: Payer: Self-pay | Admitting: Oncology

## 2011-12-16 VITALS — BP 169/90 | HR 88 | Temp 98.4°F | Ht 68.0 in | Wt 179.1 lb

## 2011-12-16 VITALS — BP 156/89 | HR 87 | Temp 98.5°F | Wt 178.7 lb

## 2011-12-16 DIAGNOSIS — C50912 Malignant neoplasm of unspecified site of left female breast: Secondary | ICD-10-CM

## 2011-12-16 DIAGNOSIS — I2510 Atherosclerotic heart disease of native coronary artery without angina pectoris: Secondary | ICD-10-CM | POA: Insufficient documentation

## 2011-12-16 DIAGNOSIS — I10 Essential (primary) hypertension: Secondary | ICD-10-CM | POA: Insufficient documentation

## 2011-12-16 DIAGNOSIS — Z7982 Long term (current) use of aspirin: Secondary | ICD-10-CM | POA: Insufficient documentation

## 2011-12-16 DIAGNOSIS — Z170 Estrogen receptor positive status [ER+]: Secondary | ICD-10-CM | POA: Insufficient documentation

## 2011-12-16 DIAGNOSIS — Z801 Family history of malignant neoplasm of trachea, bronchus and lung: Secondary | ICD-10-CM | POA: Insufficient documentation

## 2011-12-16 DIAGNOSIS — Z803 Family history of malignant neoplasm of breast: Secondary | ICD-10-CM | POA: Insufficient documentation

## 2011-12-16 DIAGNOSIS — Z8041 Family history of malignant neoplasm of ovary: Secondary | ICD-10-CM | POA: Insufficient documentation

## 2011-12-16 DIAGNOSIS — C773 Secondary and unspecified malignant neoplasm of axilla and upper limb lymph nodes: Secondary | ICD-10-CM

## 2011-12-16 DIAGNOSIS — C50419 Malignant neoplasm of upper-outer quadrant of unspecified female breast: Secondary | ICD-10-CM

## 2011-12-16 DIAGNOSIS — C50919 Malignant neoplasm of unspecified site of unspecified female breast: Secondary | ICD-10-CM

## 2011-12-16 DIAGNOSIS — Z9010 Acquired absence of unspecified breast and nipple: Secondary | ICD-10-CM | POA: Insufficient documentation

## 2011-12-16 DIAGNOSIS — Z8042 Family history of malignant neoplasm of prostate: Secondary | ICD-10-CM | POA: Insufficient documentation

## 2011-12-16 DIAGNOSIS — E785 Hyperlipidemia, unspecified: Secondary | ICD-10-CM | POA: Insufficient documentation

## 2011-12-16 DIAGNOSIS — Z79899 Other long term (current) drug therapy: Secondary | ICD-10-CM | POA: Insufficient documentation

## 2011-12-16 DIAGNOSIS — Z800 Family history of malignant neoplasm of digestive organs: Secondary | ICD-10-CM | POA: Insufficient documentation

## 2011-12-16 DIAGNOSIS — E039 Hypothyroidism, unspecified: Secondary | ICD-10-CM | POA: Insufficient documentation

## 2011-12-16 MED ORDER — PROCHLORPERAZINE 25 MG RE SUPP: 25.0000 mg | Freq: Two times a day (BID) | RECTAL | Status: DC | PRN

## 2011-12-16 MED ORDER — LORAZEPAM 0.5 MG PO TABS: 0.5000 mg | ORAL_TABLET | Freq: Four times a day (QID) | ORAL | Status: DC | PRN

## 2011-12-16 MED ORDER — ONDANSETRON HCL 8 MG PO TABS: ORAL_TABLET | ORAL | Status: DC

## 2011-12-16 MED ORDER — DEXAMETHASONE 4 MG PO TABS: ORAL_TABLET | ORAL | Status: DC

## 2011-12-16 MED ORDER — PROCHLORPERAZINE MALEATE 10 MG PO TABS: 10.0000 mg | ORAL_TABLET | Freq: Four times a day (QID) | ORAL | Status: DC | PRN

## 2011-12-16 MED ORDER — LIDOCAINE-PRILOCAINE 2.5-2.5 % EX CREA: TOPICAL_CREAM | Freq: Once | CUTANEOUS | Status: DC

## 2011-12-16 NOTE — Progress Notes (Signed)
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

## 2011-12-16 NOTE — Progress Notes (Signed)
Please see the Nurse Progress Note in the MD Initial Consult Encounter for this patient. 

## 2011-12-16 NOTE — Telephone Encounter (Signed)
gve the pt her march,april 2013 appt calendar 

## 2011-12-16 NOTE — Progress Notes (Signed)
Seen by Dr. Donnie Coffin and will start chemotherapy prior to the start of Radiation.  C/o soreness and pain left axilla and left arm.  States by end of day left arm pain increases.  Denies any numbness in fingers.  Accompanied by husband Deanne Coffer.

## 2011-12-19 ENCOUNTER — Other Ambulatory Visit: Payer: Self-pay

## 2011-12-19 DIAGNOSIS — C50919 Malignant neoplasm of unspecified site of unspecified female breast: Secondary | ICD-10-CM

## 2011-12-19 MED ORDER — LIDOCAINE-PRILOCAINE 2.5-2.5 % EX CREA: TOPICAL_CREAM | CUTANEOUS | Status: DC | PRN

## 2011-12-19 NOTE — Progress Notes (Signed)
ELECTRONICALLY SENT A PRESCRIPTION TO PT.'S PHARMACY  FOR EMLA CREAM. ATTEMPTED TO REACH PT.'S PHONE [phone].  NO ANSWER AND NO VOICE MAIL SET UP TO LEAVE A MESSAGE.

## 2011-12-19 NOTE — Telephone Encounter (Signed)
Told Mr. Bines that the prescription for the Emla cream was electronically sent to there pharmacy.  I could not leave a vm message as her vm is not set up.

## 2011-12-20 ENCOUNTER — Ambulatory Visit (INDEPENDENT_AMBULATORY_CARE_PROVIDER_SITE_OTHER): Payer: Medicaid Other | Admitting: Cardiology

## 2011-12-20 ENCOUNTER — Encounter: Payer: Self-pay | Admitting: Cardiology

## 2011-12-20 DIAGNOSIS — E785 Hyperlipidemia, unspecified: Secondary | ICD-10-CM

## 2011-12-20 DIAGNOSIS — I2510 Atherosclerotic heart disease of native coronary artery without angina pectoris: Secondary | ICD-10-CM

## 2011-12-20 DIAGNOSIS — C50419 Malignant neoplasm of upper-outer quadrant of unspecified female breast: Secondary | ICD-10-CM | POA: Insufficient documentation

## 2011-12-20 DIAGNOSIS — I10 Essential (primary) hypertension: Secondary | ICD-10-CM

## 2011-12-20 NOTE — Assessment & Plan Note (Signed)
She clearly has some white coat hypertension. Her blood pressures at home are well controlled. I will not change her medical therapy at this point though if we see a trend upward she will need additional medical therapy.

## 2011-12-20 NOTE — Patient Instructions (Signed)
The current medical regimen is effective;  continue present plan and medications.  Follow up in 4 months with Dr Antoine Poche in the [place] office.

## 2011-12-20 NOTE — Assessment & Plan Note (Signed)
The patient has had no new symptoms. No further cardiovascular testing is suggested. Her ejection fraction is well preserved. She would had no cardiovascular contraindication to the planned chemotherapy.

## 2011-12-20 NOTE — Assessment & Plan Note (Signed)
I discussed with her her lipids but we'll hold off on further therapies given the fact she's about to undergo chemotherapy with possibly change in her appetite and diet. We will review this when she has finished this therapy.

## 2011-12-20 NOTE — Progress Notes (Signed)
CC:   [name] Riley, M.D. [name] Riley, M.D., F.R.C.P.C.  REFERRING PHYSICIAN:  Pierce Riley, M.D., F.R.C.P.C.  DIAGNOSIS:  Invasive ductal carcinoma of the left breast, pT2 pN1a M0, status post modified radical mastectomy and axillary lymph node dissection.  Three of 18 lymph nodes positive.  ER positive, PR positive, and HER-2/neu negative.  HISTORY OF PRESENT ILLNESS:  The patient is a 61 year old female.  She indicates that she has a history of excision of a suspicious area in the right breast in 1998 which was negative for malignancy.  She developed some arm pain and what she felt was some swelling underneath her arm and this led to further workup that did demonstrate biopsy-proven breast cancer.  The patient's initial biopsy was on 08/25/2011 and this revealed invasive ductal carcinoma with lymphovascular space involvement of tumor.  A suspicious left axillary lymph node also demonstrated metastatic carcinoma.  This was performed after initial imaging that did suggest possible breast cancer.  The patient underwent an MRI scan of the breasts bilaterally on 08/30/2011.  This revealed a complex picture. A 3.5-cm biopsy-proven invasive ductal carcinoma was seen deep within the left breast, as well as metastatic level 1 left axillary adenopathy. There were additional abnormal enhancing left breast masses suggestive of extensive multifocal disease.  A right breast mass and additional multiple areas of non-mass enhancement were also seen, suspicious for possible multicentric malignancy in the right breast.  There were also findings suspicious for metastatic disease in the sternum and manubrium. The patient underwent a PET scan on 09/22/2011 therefore.  This revealed a left breast primary and left axillary nodal metastasis.  There was no distant metastatic disease which was seen.  The patient ultimately proceeded to undergo mastectomies bilaterally.  A simple mastectomy was performed  on the right and no malignancy was identified.  The modified radical mastectomy on the left revealed an invasive ductal carcinoma measuring 4.3 cm.  The anterior soft tissue margin was focally involved by tumor with the deep margin free.  Metastatic carcinoma was seen in 3 of 18 lymph nodes.  An additional left lateral inferior margin was present with invasive ductal carcinoma focally involving the deep margin of the specimen.  Lymphovascular space invasion was present.  Receptor studies have indicated that the tumor is ER positive, PR positive, and HER-2/neu negative with a Ki-67 staining of 79%.  This pathologically corresponded to a pT2 pN1a tumor.  The patient has seen Dr. Donnie Riley, who is proceeding with the planned chemotherapy and I have been consulted to evaluate the patient for possible post-mastectomy radiation.  I should note that in addition to the above, the patient did have additional biopsies.  A right breast needle core biopsy of a 2 o'clock mass was completed on 10/13/2011 and this showed no malignancy.  PAST MEDICAL HISTORY:  Coronary artery disease, hyperlipidemia, hypothyroidism, history of heart attack in 2001, hypertension.  CURRENT MEDICATIONS:  Aspirin, fish oil, Synthroid, Toprol, multivitamins, Percocet, Decadron with chemo, EMLA, Ativan, Zofran, Compazine.  ALLERGIES:  Contrast media and cantaloupes.  FAMILY HISTORY:  The patient's father had lung cancer and prostate cancer.  She has 1 cousin with breast cancer and 1 paternal aunt with ovarian cancer.  She also has a paternal uncle with stomach cancer and a paternal grandfather with esophageal cancer.  SOCIAL HISTORY:  The patient has no history of smoking.  She denies any alcohol use.  The patient is married and she lives at home.  REVIEW OF SYSTEMS:  Fully reviewed and  negative other than as above.  PHYSICAL EXAMINATION:  Vital Signs:  Weight 179 pounds, blood pressure 169/90, pulse 88, respiratory rate  20, temperature 98.4.  General:  Well- developed female in no acute distress, alert and oriented x3.  HEENT: Normocephalic, atraumatic.  Pupils equal, round, and reactive to light. Extraocular movements intact.  Oral cavity clear.  Neck:  Supple without any lymphadenopathy.  Cardiovascular:  Regular rate and rhythm. Respiratory:  Clear to auscultation bilaterally.  Breasts:  The patient appears to be healing well with well-healed surgical incision present consistent with prior surgery as noted above with bilateral mastectomies.  No axillary adenopathy on either side.  GI:  Abdomen is soft, nontender.  Normal bowel sounds.  Extremities:  No edema present. Neuro:  No focal deficits.  IMPRESSION AND PLAN:  [name] is a 61 year old female status post bilateral mastectomy with no findings of malignancy on the right.  She had an invasive ductal carcinoma on the left corresponding to T2 N1a tumor.  This was a 4.3-cm tumor with lymphovascular space invasion seen and 3 out of 18 lymph nodes were positive for carcinoma.  It appears from the pathologic report that the margins were focally involved with tumor as well.  Given the patient's case, I certainly believe that a number of factors point to her being at high risk for local failure and I would recommend a course of post-mastectomy radiation in her case. This would follow her chemotherapy, which is being planned through Dr. Donnie Riley.  I discussed with [name] the rationale of this treatment, including the possible benefits, risks, and side effects and all of her questions were answered.  She understands that she will need to see me back towards the end of chemotherapy to further discuss radiation and to plan for a potential 6-1/2-week course of treatment.  I therefore look forward to seeing her in the future so we can proceed with her overall treatment plan.    ______________________________ Radene Gunning, M.D., Ph.D. JSM/MEDQ  D:   12/20/2011  T:  12/20/2011  Job:  [id number]

## 2011-12-20 NOTE — Progress Notes (Signed)
HPI The patient presents for evaluation of her known coronary disease. She has been diagnosed with breast cancer. She's had bilateral mastectomies. There has been some lymph node involvement.  She is being considered for treatment to include anthracyclines. 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 Her last catheterization done several years ago demonstrated the LAD was occluded but she had no other significant vascular disease. She's had a well preserved ejection fraction. She was referred for an echocardiogram the other day which demonstrated the EF to be well preserved with no evidence of valve disease.  She had not required further followup in this clinic and has been followed by her primary provider.  Since I last saw her she has had no acute cardiovascular complaints. She was active up until this recent diagnosis. She's had no chest pressure, neck or arm discomfort. She's had no shortness of breath, PND or orthopnea. She's had rare palpitations but no presyncope or syncope. Of note she brought her lipids today and her cholesterol recently has been elevated. Her blood pressure has been treating a particularly in the doctor's office or hospital but is typically in the 130s systolic at home.    Allergies  Allergen Reactions  . Contrast Media (Iodinated Diagnostic Agents) Shortness Of Breath and Rash  . Food Shortness Of Breath    Cantaloupe    Current Outpatient Prescriptions  Medication Sig Dispense Refill  . aspirin 81 MG tablet Take 81 mg by mouth daily.        . fish oil-omega-3 fatty acids 1000 MG capsule Take 2 g by mouth at bedtime.       Marland Kitchen levothyroxine (SYNTHROID, LEVOTHROID) 75 MCG tablet Take 75 mcg by mouth daily.       . metoprolol (TOPROL-XL) 100 MG 24 hr tablet Take 100 mg by mouth daily.       . Multiple Vitamins-Minerals (MULTIVITAMIN WITH MINERALS) tablet Take 1 tablet by mouth daily.        Marland Kitchen oxyCODONE-acetaminophen (PERCOCET) 5-325 MG  per tablet Take 1 tablet by mouth every 4 (four) hours as needed for pain.  40 tablet  0  . dexamethasone (DECADRON) 4 MG tablet Take 2 tablets by mouth once a day on the day after chemotherapy and then take 2 tablets two times a day for 2 days. Take with food.  30 tablet  1  . lidocaine-prilocaine (EMLA) cream Apply topically as needed. APPLY TO PAC 1-2 HOURS PRIOR TO ACCESS  30 g  1  . LORazepam (ATIVAN) 0.5 MG tablet Take 1 tablet (0.5 mg total) by mouth every 6 (six) hours as needed (Nausea or vomiting).  30 tablet  0  . ondansetron (ZOFRAN) 8 MG tablet Take 1 tablet two times a day as needed for nausea or vomiting starting on the third day after chemotherapy.  30 tablet  1  . prochlorperazine (COMPAZINE) 10 MG tablet Take 1 tablet (10 mg total) by mouth every 6 (six) hours as needed (Nausea or vomiting).  30 tablet  1  . prochlorperazine (COMPAZINE) 25 MG suppository Place 1 suppository (25 mg total) rectally every 12 (twelve) hours as needed for nausea.  12 suppository  3   No current facility-administered medications for this visit.   Facility-Administered Medications Ordered in Other Visits  Medication Dose Route Frequency Provider Last Rate Last Dose  . lidocaine-prilocaine (EMLA) cream   Topical Once Pierce Crane, MD        Past  Medical History  Diagnosis Date  . Coronary artery disease   . Hyperlipidemia   . PONV (postoperative nausea and vomiting)     gets sick from anesthesia  . Hypothyroidism   . Cancer     left breast cancer  . Breast cancer 08/25/2011    L , invasive ductal carcinoma, ER/PR +,HER2 -  . Heart attack 09/2000    Sep 25, 2000  --no intervention  . Hypertension     Past Surgical History  Procedure Date  . Tonsillectomy   . Ovarian cyst surgery 1970  . Skin tags 05/09/1997    left axillary left neck skin tags  . Portacath placement 11/14/2011    Procedure: INSERTION PORT-A-CATH;  Surgeon: Wilmon Arms. Tsuei, MD;  Location: [facility] OR;  Service: General;   Laterality: Right;  . Breast surgery 1998    removal of benign lump in rt breast  . Breast surgery 10/2011    right simple mastectomy, left mrm  . Abdominal hysterectomy 1998    TAH, oophorectomy  . Appendectomy 1970    Family History  Problem Relation Age of Onset  . Hypertension Maternal Grandmother   . Diabetes Maternal Grandmother   . Cancer Father 21    lung cancer and Prostate Cancer  . Hypertension Mother   . Cancer Paternal Aunt     ovarian  . Cancer Cousin     breast, paternal cousin  . Cancer Paternal Uncle     stomach  . Cancer Paternal Grandfather     Esophagus    History   Social History  . Marital Status: Married    Spouse Name: N/A    Number of Children: N/A  . Years of Education: N/A   Occupational History  . Not on file.   Social History Main Topics  . Smoking status: Never Smoker   . Smokeless tobacco: Never Used  . Alcohol Use: No  . Drug Use: No  . Sexually Active: Yes    Birth Control/ Protection: Surgical     menarche 22, [place] age 60, G79, P3, 1 miscarriage,  HRT x 5-10 yrs, Mild Hot Flashes   Other Topics Concern  . Not on file   Social History Narrative  . No narrative on file    ROS:  Constipation.  Otherwise as stated in the HPI and negative for all other systems.  PHYSICAL EXAM BP 168/80  Pulse 72  Ht 5\' 8"  (1.727 m)  Wt 179 lb (81.194 kg)  BMI 27.22 kg/m2 GENERAL:  Well appearing HEENT:  Pupils equal round and reactive, fundi not visualized, oral mucosa unremarkable NECK:  No jugular venous distention, waveform within normal limits, carotid upstroke brisk and symmetric, no bruits, no thyromegaly LYMPHATICS:  No cervical, inguinal adenopathy LUNGS:  Clear to auscultation bilaterally BACK:  No CVA tenderness CHEST:  Bilateral mastectomy HEART:  PMI not displaced or sustained,S1 and S2 within normal limits, no S3, no S4, no clicks, no rubs, no murmurs ABD:  Flat, positive bowel sounds normal in frequency in pitch, no  bruits, no rebound, no guarding, no midline pulsatile mass, no hepatomegaly, no splenomegaly EXT:  2 plus pulses throughout, no edema, no cyanosis no clubbing SKIN:  No rashes no nodules NEURO:  Cranial nerves II through XII grossly intact, motor grossly intact throughout [facility]:  Cognitively intact, oriented to person place and time\  EKG:  11/07/11  sinus rhythm, rate 88, left axis deviation, left anterior fascicular block, poor anterior R wave progression, old anteroseptal infarct,  no acute ST-T wave changes. 12/20/2011   ASSESSMENT AND PLAN

## 2011-12-22 ENCOUNTER — Ambulatory Visit (HOSPITAL_BASED_OUTPATIENT_CLINIC_OR_DEPARTMENT_OTHER): Payer: Medicaid Other

## 2011-12-22 VITALS — BP 180/94 | HR 72 | Temp 98.3°F

## 2011-12-22 DIAGNOSIS — C50912 Malignant neoplasm of unspecified site of left female breast: Secondary | ICD-10-CM

## 2011-12-22 DIAGNOSIS — Z5111 Encounter for antineoplastic chemotherapy: Secondary | ICD-10-CM

## 2011-12-22 DIAGNOSIS — C50919 Malignant neoplasm of unspecified site of unspecified female breast: Secondary | ICD-10-CM

## 2011-12-22 MED ORDER — EPIRUBICIN HCL CHEMO IV INJECTION 200 MG/100ML
100.0000 mg/m2 | Freq: Once | INTRAVENOUS | Status: AC
  Administered 2011-12-22: 198 mg via INTRAVENOUS
  Filled 2011-12-22: qty 99

## 2011-12-22 MED ORDER — PALONOSETRON HCL INJECTION 0.25 MG/5ML
0.2500 mg | Freq: Once | INTRAVENOUS | Status: AC
  Administered 2011-12-22: 0.25 mg via INTRAVENOUS

## 2011-12-22 MED ORDER — SODIUM CHLORIDE 0.9 % IV SOLN
Freq: Once | INTRAVENOUS | Status: AC
  Administered 2011-12-22: 16:00:00 via INTRAVENOUS

## 2011-12-22 MED ORDER — HEPARIN SOD (PORK) LOCK FLUSH 100 UNIT/ML IV SOLN
500.0000 [IU] | Freq: Once | INTRAVENOUS | Status: AC | PRN
  Administered 2011-12-22: 500 [IU]
  Filled 2011-12-22: qty 5

## 2011-12-22 MED ORDER — DEXAMETHASONE SODIUM PHOSPHATE 4 MG/ML IJ SOLN
12.0000 mg | Freq: Once | INTRAMUSCULAR | Status: AC
  Administered 2011-12-22: 12 mg via INTRAVENOUS

## 2011-12-22 MED ORDER — FLUOROURACIL CHEMO INJECTION 2.5 GM/50ML
500.0000 mg/m2 | Freq: Once | INTRAVENOUS | Status: AC
  Administered 2011-12-22: 1000 mg via INTRAVENOUS
  Filled 2011-12-22: qty 20

## 2011-12-22 MED ORDER — SODIUM CHLORIDE 0.9 % IJ SOLN
10.0000 mL | INTRAMUSCULAR | Status: DC | PRN
  Administered 2011-12-22: 10 mL
  Filled 2011-12-22: qty 10

## 2011-12-22 MED ORDER — SODIUM CHLORIDE 0.9 % IV SOLN
150.0000 mg | Freq: Once | INTRAVENOUS | Status: AC
  Administered 2011-12-22: 150 mg via INTRAVENOUS
  Filled 2011-12-22: qty 5

## 2011-12-22 MED ORDER — SODIUM CHLORIDE 0.9 % IV SOLN
500.0000 mg/m2 | Freq: Once | INTRAVENOUS | Status: AC
  Administered 2011-12-22: 980 mg via INTRAVENOUS
  Filled 2011-12-22: qty 49

## 2011-12-22 NOTE — Progress Notes (Signed)
Excellent blood return before during and after Ellence.

## 2011-12-22 NOTE — Patient Instructions (Signed)
[facility] Discharge Instructions for Patients Receiving Chemotherapy  Today you received the following chemotherapy agents Cytoxan,5FU, Epirubicin To help prevent nausea and vomiting after your treatment, we encourage you to take your nausea medication Begin taking it at as often as prescribed    If you develop nausea and vomiting that is not controlled by your nausea medication, call the clinic. If it is after clinic hours your family physician or the after hours number for the clinic or go to the Emergency Department.   BELOW ARE SYMPTOMS THAT SHOULD BE REPORTED IMMEDIATELY:  *FEVER GREATER THAN 100.5 F  *CHILLS WITH OR WITHOUT FEVER  NAUSEA AND VOMITING THAT IS NOT CONTROLLED WITH YOUR NAUSEA MEDICATION  *UNUSUAL SHORTNESS OF BREATH  *UNUSUAL BRUISING OR BLEEDING  TENDERNESS IN MOUTH AND THROAT WITH OR WITHOUT PRESENCE OF ULCERS  *URINARY PROBLEMS  *BOWEL PROBLEMS  UNUSUAL RASH Items with * indicate a potential emergency and should be followed up as soon as possible.  One of the nurses will contact you 24 hours after your treatment. Please let the nurse know about any problems that you may have experienced. Feel free to call the clinic you have any questions or concerns. The clinic phone number is [phone].   I have been informed and understand all the instructions given to me. I know to contact the clinic, my physician, or go to the Emergency Department if any problems should occur. I do not have any questions at this time, but understand that I may call the clinic during office hours   should I have any questions or need assistance in obtaining follow up care.    __________________________________________  _____________  __________ Signature of Patient or Authorized Representative            Date                   Time    __________________________________________ Nurse's Signature

## 2011-12-23 ENCOUNTER — Telehealth: Payer: Self-pay | Admitting: *Deleted

## 2011-12-23 ENCOUNTER — Ambulatory Visit (HOSPITAL_BASED_OUTPATIENT_CLINIC_OR_DEPARTMENT_OTHER): Payer: Medicaid Other

## 2011-12-23 VITALS — BP 151/86 | HR 88 | Temp 98.4°F

## 2011-12-23 DIAGNOSIS — C50919 Malignant neoplasm of unspecified site of unspecified female breast: Secondary | ICD-10-CM

## 2011-12-23 DIAGNOSIS — Z5189 Encounter for other specified aftercare: Secondary | ICD-10-CM

## 2011-12-23 DIAGNOSIS — C50912 Malignant neoplasm of unspecified site of left female breast: Secondary | ICD-10-CM

## 2011-12-23 MED ORDER — PEGFILGRASTIM INJECTION 6 MG/0.6ML
6.0000 mg | Freq: Once | SUBCUTANEOUS | Status: AC
  Administered 2011-12-23: 6 mg via SUBCUTANEOUS
  Filled 2011-12-23: qty 0.6

## 2011-12-23 NOTE — Telephone Encounter (Signed)
Message copied by Augusto Garbe on Fri Dec 23, 2011  4:31 PM ------      Message from: Charma Igo      Created: Fri Dec 23, 2011  9:23 AM       RECEIVED 1ST 5FU,CYTOXAN AND ELLENCE YESTERDAY 3/7. CALL HER AT HOME. SHE HAS NEULASTA SHOT TODAY AT 4P

## 2011-12-23 NOTE — Telephone Encounter (Signed)
Patient here for injection and this nurse assessed patient.  She did not go to work today.  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  A child and staff member were sick this week and she felt tired.   Took an ativan for nausea and has no emesis.  Has had one loose stool but doesn't feel she has the noraviris that is circulating.   Drinking fluids and has been able to eat small meals today.  Encouraged to keep the patient education drug information near for the agents she received and to call if any problems over the weekend.

## 2011-12-24 ENCOUNTER — Ambulatory Visit: Payer: Medicaid Other

## 2011-12-29 ENCOUNTER — Encounter: Payer: Self-pay | Admitting: Physician Assistant

## 2011-12-29 ENCOUNTER — Telehealth: Payer: Self-pay | Admitting: *Deleted

## 2011-12-29 ENCOUNTER — Ambulatory Visit (HOSPITAL_BASED_OUTPATIENT_CLINIC_OR_DEPARTMENT_OTHER): Payer: Medicaid Other | Admitting: Physician Assistant

## 2011-12-29 ENCOUNTER — Other Ambulatory Visit (HOSPITAL_BASED_OUTPATIENT_CLINIC_OR_DEPARTMENT_OTHER): Payer: Medicaid Other | Admitting: Lab

## 2011-12-29 VITALS — BP 160/92 | HR 90 | Temp 98.4°F | Ht 68.0 in | Wt 180.8 lb

## 2011-12-29 DIAGNOSIS — D702 Other drug-induced agranulocytosis: Secondary | ICD-10-CM

## 2011-12-29 DIAGNOSIS — C50919 Malignant neoplasm of unspecified site of unspecified female breast: Secondary | ICD-10-CM

## 2011-12-29 DIAGNOSIS — C50912 Malignant neoplasm of unspecified site of left female breast: Secondary | ICD-10-CM

## 2011-12-29 DIAGNOSIS — T50904A Poisoning by unspecified drugs, medicaments and biological substances, undetermined, initial encounter: Secondary | ICD-10-CM

## 2011-12-29 LAB — CBC WITH DIFFERENTIAL/PLATELET
BASO%: 0.7 % (ref 0.0–2.0)
Basophils Absolute: 0 10*3/uL (ref 0.0–0.1)
EOS%: 9.2 % — ABNORMAL HIGH (ref 0.0–7.0)
Eosinophils Absolute: 0.1 10*3/uL (ref 0.0–0.5)
HCT: 35.7 % (ref 34.8–46.6)
HGB: 12.3 g/dL (ref 11.6–15.9)
LYMPH%: 82.9 % — ABNORMAL HIGH (ref 14.0–49.7)
MCH: 31.1 pg (ref 25.1–34.0)
MCHC: 34.4 g/dL (ref 31.5–36.0)
MCV: 90.5 fL (ref 79.5–101.0)
MONO#: 0 10*3/uL — ABNORMAL LOW (ref 0.1–0.9)
MONO%: 3.4 % (ref 0.0–14.0)
NEUT#: 0 10*3/uL — CL (ref 1.5–6.5)
NEUT%: 3.8 % — ABNORMAL LOW (ref 38.4–76.8)
Platelets: 147 10*3/uL (ref 145–400)
RBC: 3.95 10*6/uL (ref 3.70–5.45)
RDW: 12.6 % (ref 11.2–14.5)
WBC: 1 10*3/uL — ABNORMAL LOW (ref 3.9–10.3)
lymph#: 0.8 10*3/uL — ABNORMAL LOW (ref 0.9–3.3)

## 2011-12-29 LAB — COMPREHENSIVE METABOLIC PANEL
ALT: 169 U/L — ABNORMAL HIGH (ref 0–35)
AST: 147 U/L — ABNORMAL HIGH (ref 0–37)
Albumin: 4.2 g/dL (ref 3.5–5.2)
Alkaline Phosphatase: 151 U/L — ABNORMAL HIGH (ref 39–117)
BUN: 17 mg/dL (ref 6–23)
CO2: 28 mEq/L (ref 19–32)
Calcium: 9.6 mg/dL (ref 8.4–10.5)
Chloride: 102 mEq/L (ref 96–112)
Creatinine, Ser: 0.66 mg/dL (ref 0.50–1.10)
Glucose, Bld: 93 mg/dL (ref 70–99)
Potassium: 3.9 mEq/L (ref 3.5–5.3)
Sodium: 138 mEq/L (ref 135–145)
Total Bilirubin: 0.4 mg/dL (ref 0.3–1.2)
Total Protein: 6.3 g/dL (ref 6.0–8.3)

## 2011-12-29 LAB — CANCER ANTIGEN 27.29: CA 27.29: 36 U/mL (ref 0–39)

## 2011-12-29 MED ORDER — CIPROFLOXACIN HCL 500 MG PO TABS: 500.0000 mg | ORAL_TABLET | Freq: Two times a day (BID) | ORAL | Status: AC

## 2011-12-29 NOTE — Telephone Encounter (Signed)
sent email to [name] Riley so she can make the patient 's treatment

## 2011-12-29 NOTE — Telephone Encounter (Signed)
VERBAL ORDER AND READ BACK TO CHRIS SCHERER,PA-PT MAY TAKE BENADRYL 25MG . SHE WILL NEED A DRIVER TO COME TO HER APPOINTMENT AT THIS OFFICE THIS AFTERNOON. NOTIFIED PT. SHE VOICES UNDERSTANDING

## 2011-12-29 NOTE — Telephone Encounter (Signed)
gave patient appointment for 12-2011 [phone] 02-2012 printed out calendar and gave to the patient

## 2011-12-29 NOTE — Progress Notes (Signed)
Hematology and Oncology Follow Up Visit  [name] Riley [id number] [birth year] 61 y.o. 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 Right simple mastectomy was clear. She is currently day 7 cycle one of 4 planned adjuvant q3wk FEC. Neulasta support on day 2.  Interim History:   [name] Riley is seen today with her husband accompaniment for followup after her first of 4 planned every 3 week doses of adjuvant FEC. She is feeling well overall. She does admit that she had some fatigability, earlier today, she actually experienced an episode of hives, it was short lived, dissipating even before she took 25 mg of Benadryl. She did experience some fatigability, but this is improving. 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 She has been working this week. She denies any fevers, chills, night sweats, no shortness of breath or chest pain. She denies any nausea, emesis, she has chronic constipation but no worsening of his underlying process. No diarrhea. She denies any diffuse bone pain. A detailed review of systems is otherwise noncontributory as noted below.  Review of Systems: Constitutional:  Fatigued, but otherwise feels well. Eyes: uses glasses ENT: no complaints Cardiovascular: no chest pain or dyspnea on exertion Respiratory: no cough, shortness of breath, or wheezing Neurological: no TIA or stroke symptoms Dermatological: negative Gastrointestinal: no abdominal pain, change in bowel habits, or black or bloody stools Genito-Urinary: no dysuria, trouble voiding, or hematuria Hematological and Lymphatic: negative Breast: s/p B  mastectomies. Musculoskeletal: negative Remaining ROS negative.   Medications:   I have reviewed the patient's current medications.  Current Outpatient Prescriptions  Medication Sig Dispense Refill  . aspirin 81 MG tablet Take 81 mg by mouth daily.        Marland Kitchen dexamethasone (DECADRON) 4 MG tablet Take 2 tablets by mouth once a day on the day after chemotherapy and then take 2 tablets two times a day for 2 days. Take with food.  30 tablet  1  . fish oil-omega-3 fatty acids 1000 MG capsule Take 2 g by mouth at bedtime.       Marland Kitchen levothyroxine (SYNTHROID, LEVOTHROID) 75 MCG tablet Take 75 mcg by mouth daily.       Marland Kitchen lidocaine-prilocaine (EMLA) cream Apply topically as needed. APPLY TO PAC 1-2 HOURS PRIOR TO ACCESS  30 g  1  . LORazepam (ATIVAN) 0.5 MG tablet Take 1 tablet (0.5 mg total) by mouth every 6 (six) hours as needed (Nausea or vomiting).  30 tablet  0  . metoprolol (TOPROL-XL) 100 MG 24 hr tablet Take 100 mg by mouth daily.       . Multiple Vitamins-Minerals (MULTIVITAMIN WITH MINERALS) tablet Take 1 tablet by mouth daily.        . ondansetron (ZOFRAN) 8 MG tablet Take 1 tablet two times a day as needed for nausea or vomiting starting on the third day after chemotherapy.  30 tablet  1  . prochlorperazine (COMPAZINE) 10 MG tablet Take 1 tablet (10 mg total) by mouth every 6 (six) hours as needed (Nausea or vomiting).  30 tablet  1  . prochlorperazine (COMPAZINE) 25 MG suppository Place 1 suppository (25 mg total) rectally every 12 (  twelve) hours as needed for nausea.  12 suppository  3   No current facility-administered medications for this visit.   Facility-Administered Medications Ordered in Other Visits  Medication Dose Route Frequency Provider Last Rate Last Dose  . lidocaine-prilocaine (EMLA) cream   Topical Once Pierce Crane, MD        Allergies:  Allergies  Allergen Reactions  . Contrast Media (Iodinated Diagnostic Agents) Shortness Of Breath and Rash  . Food Shortness Of  Breath    Cantaloupe    Physical Exam: Filed Vitals:   12/29/11 1350  BP: 160/92  Pulse: 90  Temp: 98.4 F (36.9 C)    Body mass index is 27.49 kg/(m^2). Weight: 180 lbs. HEENT:  Sclerae anicteric, conjunctivae pink.  Oropharynx clear.  No mucositis or candidiasis.   Nodes:  No cervical, supraclavicular, or axillary lymphadenopathy palpated.  Breast Exam:  Deferred Lungs:  Clear to auscultation bilaterally.  No crackles, rhonchi, or wheezes.   Heart:  Regular rate and rhythm.   Abdomen:  Soft, nontender.  Positive bowel sounds.  No organomegaly or masses palpated.   Musculoskeletal:  No focal spinal tenderness to palpation.  Extremities:  Benign.  No peripheral edema or cyanosis.   Skin:  Benign.   Neuro:  Nonfocal, alert and oriented x 3.   Lab Results: Lab Results  Component Value Date   WBC 1.0* 12/29/2011   HGB 12.3 12/29/2011   HCT 35.7 12/29/2011   MCV 90.5 12/29/2011   PLT 147 12/29/2011   NEUTROABS 0.0* 12/29/2011     Chemistry      Component Value Date/Time   NA 140 12/08/2011 1117   K 3.6 12/08/2011 1117   CL 105 12/08/2011 1117   CO2 26 12/08/2011 1117   BUN 14 12/08/2011 1117   CREATININE 0.73 12/08/2011 1117      Component Value Date/Time   CALCIUM 10.0 12/08/2011 1117   ALKPHOS 115 12/08/2011 1117   AST 38* 12/08/2011 1117   ALT 48* 12/08/2011 1117   BILITOT 0.5 12/08/2011 1117      Lab Results  Component Value Date   LABCA2 54* 12/08/2011   Echocardiogram: 12/15/11 Study Conclusions - Left ventricle: Systolic function was normal. The estimated ejection fraction was in the range of 55% to 60%. Wall motion was normal; there were no regional wall motion abnormalities. Doppler parameters are consistent with abnormal left ventricular relaxation (grade 1 diastolic dysfunction). - Aortic valve: There was no stenosis. Trivial regurgitation. - Mitral valve: Mild regurgitation. - Left atrium: The atrium was at the upper limits of normal in size. - Right  ventricle: The cavity size was normal. Systolic function was normal. - Pulmonary arteries: No complete TR doppler jet so unable to estimate PA systolic pressure. - Inferior vena cava: The vessel was normal in size; the respirophasic diameter changes were in the normal range (= 50%); findings are consistent with normal central venous pressure. Impressions: - Normal LV size and systolic function, EF 55-60%. Normal RV size and systolic function. No significant valvular dysfunction. 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 Right simple mastectomy was clear. She is currently day  7 cycle one of 4 planned adjuvant q3wk FEC. Neulasta support on day 2. 2. Profound afebrile neutropenia.  Case reviewed with Dr. Pierce Crane.   Plan:  [name] Riley will follow neutropenic precautions, and start Cipro 500 mg by mouth twice a day through 01/02/2012. I will see her back in 2 weeks' time prior to day 1 cycle 2 of 4 planned every 3 week doses of adjuvant FEC. She knows to contact us sooner if the need should arise.  This plan was reviewed with the patient, who voices understanding and agreement.  She knows to call with any changes or problems.    [name] Presas T, PA-C 12/29/2011

## 2012-01-02 ENCOUNTER — Ambulatory Visit: Payer: Medicaid Other | Admitting: Cardiology

## 2012-01-02 ENCOUNTER — Encounter: Payer: Self-pay | Admitting: *Deleted

## 2012-01-02 NOTE — Progress Notes (Signed)
[facility] Psychosocial Distress Screening Clinical Social Work  Clinical Social Work was referred by distress screening protocol.  The patient scored a 6 on the Psychosocial Distress Thermometer which indicates moderate distress. Clinical Social Worker contacted patient to assess for distress and other psychosocial needs.  The patient states she is doing well and has a lot of support from her spouse and friends.  She is currently working and is grateful her employer is extremely flexible with her schedule. Ms. Stepanian states she is most anxious about losing her hair. CSW validated patient's feelings and offered resources.  The patient has already completed the Look Good, Feel Better class and plans to visit [facility].  Patient has no needs at this time, however, CSW encouraged pt to call with any questions or concerns.   Clinical Social Worker follow up needed: no  If yes, follow up plan:  Kathrin Penner, MSW, [facility] Clinical Social Worker [facility] [phone]

## 2012-01-10 ENCOUNTER — Encounter (INDEPENDENT_AMBULATORY_CARE_PROVIDER_SITE_OTHER): Payer: Self-pay | Admitting: Surgery

## 2012-01-10 ENCOUNTER — Ambulatory Visit (INDEPENDENT_AMBULATORY_CARE_PROVIDER_SITE_OTHER): Payer: Medicaid Other | Admitting: Surgery

## 2012-01-10 VITALS — BP 156/100 | HR 80 | Temp 98.4°F | Resp 16 | Ht 68.0 in | Wt 181.2 lb

## 2012-01-10 DIAGNOSIS — C50919 Malignant neoplasm of unspecified site of unspecified female breast: Secondary | ICD-10-CM

## 2012-01-10 DIAGNOSIS — C50912 Malignant neoplasm of unspecified site of left female breast: Secondary | ICD-10-CM

## 2012-01-10 NOTE — Progress Notes (Signed)
S/p bilateral mastectomies/ left axillary lymph node dissection/ right port placement.  She has started her chemotherapy, which will be followed by radiation.  She seems to be tolerating her chemo fairly well.  Physical therapy has worked with her on her shoulder mobility, which is improved, but still not normal.    Filed Vitals:   01/10/12 1323  BP: 156/100  Pulse: 80  Temp: 98.4 F (36.9 C)  Resp: 16   Both incisions are healing well with no sign of infection or seroma.  She has more ROM in her left shoulder, but still has some tight scar tissue limiting full extension.    Imp:  Left breast cancer T2N1M0 - s/p mastectomies and axillary lymph node dissection  Plan:  Keep the skin around the incisions well-moisturized;  Continue stretching exercises to decrease the tightness in the axilla. F/U 6 months.  Wilmon Arms. [name], MD, [facility] Surgery  01/10/2012 1:49 PM

## 2012-01-12 ENCOUNTER — Ambulatory Visit (HOSPITAL_BASED_OUTPATIENT_CLINIC_OR_DEPARTMENT_OTHER): Payer: Medicaid Other

## 2012-01-12 ENCOUNTER — Encounter: Payer: Self-pay | Admitting: Physician Assistant

## 2012-01-12 ENCOUNTER — Ambulatory Visit: Payer: Medicaid Other | Admitting: Physician Assistant

## 2012-01-12 ENCOUNTER — Other Ambulatory Visit (HOSPITAL_BASED_OUTPATIENT_CLINIC_OR_DEPARTMENT_OTHER): Payer: Medicaid Other | Admitting: Lab

## 2012-01-12 ENCOUNTER — Telehealth: Payer: Self-pay | Admitting: Oncology

## 2012-01-12 VITALS — BP 172/92 | HR 97 | Temp 98.8°F | Ht 68.0 in | Wt 181.8 lb

## 2012-01-12 DIAGNOSIS — C50919 Malignant neoplasm of unspecified site of unspecified female breast: Secondary | ICD-10-CM

## 2012-01-12 DIAGNOSIS — Z5111 Encounter for antineoplastic chemotherapy: Secondary | ICD-10-CM

## 2012-01-12 DIAGNOSIS — C50912 Malignant neoplasm of unspecified site of left female breast: Secondary | ICD-10-CM

## 2012-01-12 LAB — CBC WITH DIFFERENTIAL/PLATELET
BASO%: 0.7 % (ref 0.0–2.0)
Basophils Absolute: 0.1 10*3/uL (ref 0.0–0.1)
EOS%: 0.1 % (ref 0.0–7.0)
Eosinophils Absolute: 0 10*3/uL (ref 0.0–0.5)
HCT: 34.8 % (ref 34.8–46.6)
HGB: 12.2 g/dL (ref 11.6–15.9)
LYMPH%: 24.1 % (ref 14.0–49.7)
MCH: 30.8 pg (ref 25.1–34.0)
MCHC: 35.1 g/dL (ref 31.5–36.0)
MCV: 87.9 fL (ref 79.5–101.0)
MONO#: 0.8 10*3/uL (ref 0.1–0.9)
MONO%: 9.5 % (ref 0.0–14.0)
NEUT#: 5.3 10*3/uL (ref 1.5–6.5)
NEUT%: 65.6 % (ref 38.4–76.8)
Platelets: 300 10*3/uL (ref 145–400)
RBC: 3.96 10*6/uL (ref 3.70–5.45)
RDW: 12.9 % (ref 11.2–14.5)
WBC: 8.1 10*3/uL (ref 3.9–10.3)
lymph#: 2 10*3/uL (ref 0.9–3.3)

## 2012-01-12 LAB — COMPREHENSIVE METABOLIC PANEL
ALT: 43 U/L — ABNORMAL HIGH (ref 0–35)
AST: 23 U/L (ref 0–37)
Albumin: 4.3 g/dL (ref 3.5–5.2)
Alkaline Phosphatase: 132 U/L — ABNORMAL HIGH (ref 39–117)
BUN: 10 mg/dL (ref 6–23)
CO2: 29 mEq/L (ref 19–32)
Calcium: 9.6 mg/dL (ref 8.4–10.5)
Chloride: 102 mEq/L (ref 96–112)
Creatinine, Ser: 0.59 mg/dL (ref 0.50–1.10)
Glucose, Bld: 86 mg/dL (ref 70–99)
Potassium: 3.5 mEq/L (ref 3.5–5.3)
Sodium: 140 mEq/L (ref 135–145)
Total Bilirubin: 0.2 mg/dL — ABNORMAL LOW (ref 0.3–1.2)
Total Protein: 7.1 g/dL (ref 6.0–8.3)

## 2012-01-12 MED ORDER — SODIUM CHLORIDE 0.9 % IJ SOLN
10.0000 mL | INTRAMUSCULAR | Status: DC | PRN
  Administered 2012-01-12: 10 mL
  Filled 2012-01-12: qty 10

## 2012-01-12 MED ORDER — SODIUM CHLORIDE 0.9 % IV SOLN
500.0000 mg/m2 | Freq: Once | INTRAVENOUS | Status: AC
  Administered 2012-01-12: 980 mg via INTRAVENOUS
  Filled 2012-01-12: qty 49

## 2012-01-12 MED ORDER — HEPARIN SOD (PORK) LOCK FLUSH 100 UNIT/ML IV SOLN
500.0000 [IU] | Freq: Once | INTRAVENOUS | Status: AC | PRN
  Administered 2012-01-12: 500 [IU]
  Filled 2012-01-12: qty 5

## 2012-01-12 MED ORDER — EPIRUBICIN HCL CHEMO IV INJECTION 200 MG/100ML
100.0000 mg/m2 | Freq: Once | INTRAVENOUS | Status: AC
  Administered 2012-01-12: 198 mg via INTRAVENOUS
  Filled 2012-01-12: qty 99

## 2012-01-12 MED ORDER — DEXAMETHASONE SODIUM PHOSPHATE 4 MG/ML IJ SOLN
12.0000 mg | Freq: Once | INTRAMUSCULAR | Status: AC
  Administered 2012-01-12: 12 mg via INTRAVENOUS

## 2012-01-12 MED ORDER — PALONOSETRON HCL INJECTION 0.25 MG/5ML
0.2500 mg | Freq: Once | INTRAVENOUS | Status: AC
  Administered 2012-01-12: 0.25 mg via INTRAVENOUS

## 2012-01-12 MED ORDER — SODIUM CHLORIDE 0.9 % IV SOLN
150.0000 mg | Freq: Once | INTRAVENOUS | Status: AC
  Administered 2012-01-12: 150 mg via INTRAVENOUS
  Filled 2012-01-12: qty 5

## 2012-01-12 MED ORDER — SODIUM CHLORIDE 0.9 % IV SOLN
Freq: Once | INTRAVENOUS | Status: AC
  Administered 2012-01-12: 15:00:00 via INTRAVENOUS

## 2012-01-12 MED ORDER — FLUOROURACIL CHEMO INJECTION 2.5 GM/50ML
500.0000 mg/m2 | Freq: Once | INTRAVENOUS | Status: AC
  Administered 2012-01-12: 1000 mg via INTRAVENOUS
  Filled 2012-01-12: qty 20

## 2012-01-12 MED ORDER — LORAZEPAM 2 MG/ML IJ SOLN
0.5000 mg | Freq: Once | INTRAMUSCULAR | Status: AC
  Administered 2012-01-12: 0.5 mg via INTRAVENOUS

## 2012-01-12 NOTE — Telephone Encounter (Signed)
gve the pt her April 2013 appt calendar 

## 2012-01-12 NOTE — Patient Instructions (Addendum)
1.) Start Cipro 500mg  twice a day on 01/17/12-  2.) Start Claritin 01/13/12

## 2012-01-12 NOTE — Patient Instructions (Signed)
[facility] Discharge Instructions for Patients Receiving Chemotherapy  Today you received the following chemotherapy agents Ellence, 5FU, and Cytoxan.  To help prevent nausea and vomiting after your treatment, we encourage you to take your nausea medication as prescribed by your physician.  If you develop nausea and vomiting that is not controlled by your nausea medication, call the clinic. If it is after clinic hours your family physician or the after hours number for the clinic or go to the Emergency Department.   BELOW ARE SYMPTOMS THAT SHOULD BE REPORTED IMMEDIATELY:  *FEVER GREATER THAN 100.5 F  *CHILLS WITH OR WITHOUT FEVER  NAUSEA AND VOMITING THAT IS NOT CONTROLLED WITH YOUR NAUSEA MEDICATION  *UNUSUAL SHORTNESS OF BREATH  *UNUSUAL BRUISING OR BLEEDING  TENDERNESS IN MOUTH AND THROAT WITH OR WITHOUT PRESENCE OF ULCERS  *URINARY PROBLEMS  *BOWEL PROBLEMS  UNUSUAL RASH Items with * indicate a potential emergency and should be followed up as soon as possible.  Feel free to call the clinic you have any questions or concerns. The clinic phone number is [phone].   I have been informed and understand all the instructions given to me. I know to contact the clinic, my physician, or go to the Emergency Department if any problems should occur. I do not have any questions at this time, but understand that I may call the clinic during office hours   should I have any questions or need assistance in obtaining follow up care.    __________________________________________  _____________  __________ Signature of Patient or Authorized Representative            Date                   Time    __________________________________________ Nurse's Signature

## 2012-01-13 ENCOUNTER — Ambulatory Visit (HOSPITAL_BASED_OUTPATIENT_CLINIC_OR_DEPARTMENT_OTHER): Payer: Medicaid Other

## 2012-01-13 ENCOUNTER — Other Ambulatory Visit: Payer: Self-pay | Admitting: Oncology

## 2012-01-13 VITALS — BP 151/83 | HR 92 | Temp 98.6°F

## 2012-01-13 DIAGNOSIS — C50919 Malignant neoplasm of unspecified site of unspecified female breast: Secondary | ICD-10-CM

## 2012-01-13 DIAGNOSIS — Z5189 Encounter for other specified aftercare: Secondary | ICD-10-CM

## 2012-01-13 DIAGNOSIS — C50912 Malignant neoplasm of unspecified site of left female breast: Secondary | ICD-10-CM

## 2012-01-13 MED ORDER — PEGFILGRASTIM INJECTION 6 MG/0.6ML
6.0000 mg | Freq: Once | SUBCUTANEOUS | Status: AC
  Administered 2012-01-13: 6 mg via SUBCUTANEOUS

## 2012-01-13 NOTE — Progress Notes (Signed)
Hematology and Oncology Follow Up Visit  [name] Riley [id number] 1951-08-10 61 y.o. 01/13/2012    HPI: [name] Riley is a 61 year-old [place] woman with a history of a T2 N1a infiltrating ductal carcinoma of the left breast for which she underwent a left modified radical mastectomy with axillary node dissection which revealed 3/18 nodes with metastatic disease and evidence of extracapsular extension, ER/PR positive at 89/81% respectively, Ki-67 at 79%, HER-2 negative. Right simple mastectomy was clear. She is currently day 1 cycle 2/4 planned adjuvant q3wk FEC. Neulasta support on day 2. 2. History of profound afebrile neutropenia.  Case reviewed with Dr. Pierce Crane.   Interim History:   [name] Riley is seen today with her husband accompaniment for followup prior to her second of 4 planned doses of every 3 week FEC given in the adjuvant setting. She is feeling quite well today, denying any fevers, chills, or night sweats. No shortness of breath or chest pain issues. She denies nausea, emesis, diarrhea or constipation. No long bone pain. No bleeding or bruising symptoms. She has noted alopecia. She is aware of hesitate for anti-emetics following therapy, she also knows that she'll be initiating Cipro for neutropenic precautions prior to her week followup. A detailed review of systems is otherwise noncontributory as noted below.  Review of Systems: Constitutional:  Fatigued, but otherwise feels well. Eyes: uses glasses ENT: no complaints Cardiovascular: no chest pain or dyspnea on exertion Respiratory: no cough, shortness of breath, or wheezing Neurological: no TIA or stroke symptoms Dermatological: negative Gastrointestinal: no abdominal pain, change in bowel habits, or black or bloody stools Genito-Urinary: no dysuria, trouble voiding, or hematuria Hematological and Lymphatic: negative Breast: s/p B mastectomies. Musculoskeletal: negative Remaining ROS negative.   Medications:   I have reviewed the patient's current medications.  Current Outpatient Prescriptions  Medication Sig Dispense Refill  . aspirin 81 MG tablet Take 81 mg by mouth daily.        Marland Kitchen dexamethasone (DECADRON) 4 MG tablet Take 2 tablets by mouth once a day on the day after chemotherapy and then take 2 tablets two times a day for 2 days. Take with food.  30 tablet  1  . fish oil-omega-3 fatty acids 1000 MG capsule Take 2 g by mouth at bedtime.       Marland Kitchen levothyroxine (SYNTHROID, LEVOTHROID) 75 MCG tablet Take 75 mcg by mouth daily.       Marland Kitchen lidocaine-prilocaine (EMLA) cream Apply topically as needed. APPLY TO PAC 1-2 HOURS PRIOR TO ACCESS  30 g  1  . LORazepam (ATIVAN) 0.5 MG tablet Take 1 tablet (0.5 mg total) by mouth every 6 (six) hours as needed (Nausea or vomiting).  30 tablet  0  . metoprolol (TOPROL-XL) 100 MG 24 hr tablet Take 100 mg by mouth daily.       . Multiple Vitamins-Minerals (MULTIVITAMIN WITH MINERALS) tablet Take 1 tablet by mouth daily.        . ondansetron (ZOFRAN) 8 MG tablet Take 1 tablet two times a day as needed for nausea or vomiting starting on the third day after chemotherapy.  30 tablet  1  . prochlorperazine (COMPAZINE) 10 MG tablet Take 1 tablet (10 mg total) by mouth every 6 (six) hours as needed (Nausea or vomiting).  30 tablet  1  . prochlorperazine (COMPAZINE) 25 MG suppository Place 1 suppository (25 mg total) rectally every 12 (twelve) hours as needed for nausea.  12 suppository  3   No current facility-administered medications  for this visit.   Facility-Administered Medications Ordered in Other Visits  Medication Dose Route Frequency Provider Last Rate Last Dose  . 0.9 %  sodium chloride infusion   Intravenous Once Amada Kingfisher, PA      . cyclophosphamide (CYTOXAN) 980 mg in sodium chloride 0.9 % 250 mL chemo infusion  500 mg/m2 (Treatment Plan Actual) Intravenous Once Amada Kingfisher, PA   980 mg at 01/12/12 1619  . dexamethasone (DECADRON) injection 12 mg   12 mg Intravenous Once Amada Kingfisher, PA   12 mg at 01/12/12 1519  . epirubicin (ELLENCE) chemo injection 198 mg  100 mg/m2 (Treatment Plan Actual) Intravenous Once Amada Kingfisher, PA   198 mg at 01/12/12 1559  . fluorouracil (ADRUCIL) chemo injection 1,000 mg  500 mg/m2 (Treatment Plan Actual) Intravenous Once Amada Kingfisher, PA   1,000 mg at 01/12/12 1619  . fosaprepitant (EMEND) 150 mg in sodium chloride 0.9 % 145 mL IVPB  150 mg Intravenous Once Amada Kingfisher, PA   150 mg at 01/12/12 1519  . heparin lock flush 100 unit/mL  500 Units Intracatheter Once PRN Amada Kingfisher, PA   500 Units at 01/12/12 1706  . lidocaine-prilocaine (EMLA) cream   Topical Once Pierce Crane, MD      . LORazepam (ATIVAN) injection 0.5 mg  0.5 mg Intravenous Once Amada Kingfisher, PA   0.5 mg at 01/12/12 1509  . palonosetron (ALOXI) injection 0.25 mg  0.25 mg Intravenous Once Amada Kingfisher, PA   0.25 mg at 01/12/12 1510  . pegfilgrastim (NEULASTA) injection 6 mg  6 mg Subcutaneous Once Pierce Crane, MD   6 mg at 01/13/12 0935  . DISCONTD: sodium chloride 0.9 % injection 10 mL  10 mL Intracatheter PRN Amada Kingfisher, PA   10 mL at 01/12/12 1706    Allergies:  Allergies  Allergen Reactions  . Contrast Media (Iodinated Diagnostic Agents) Shortness Of Breath and Rash  . Food Shortness Of Breath    Cantaloupe    Physical Exam: Filed Vitals:   01/12/12 1358  BP: 172/92  Pulse: 97  Temp: 98.8 F (37.1 C)    Body mass index is 27.64 kg/(m^2). Weight: 181 lbs. HEENT:  Sclerae anicteric, conjunctivae pink.  Oropharynx clear.  No mucositis or candidiasis.   Nodes:  No cervical, supraclavicular, or axillary lymphadenopathy palpated.  Breast Exam:  Deferred Lungs:  Clear to auscultation bilaterally.  No crackles, rhonchi, or wheezes.   Heart:  Regular rate and rhythm.   Abdomen:  Soft, nontender.  Positive bowel sounds.  No organomegaly or masses palpated.   Musculoskeletal:  No  focal spinal tenderness to palpation.  Extremities:  Benign.  No peripheral edema or cyanosis.   Skin:  Benign.   Neuro:  Nonfocal, alert and oriented x 3.   Lab Results: Lab Results  Component Value Date   WBC 8.1 01/12/2012   HGB 12.2 01/12/2012   HCT 34.8 01/12/2012   MCV 87.9 01/12/2012   PLT 300 01/12/2012   NEUTROABS 5.3 01/12/2012     Chemistry      Component Value Date/Time   NA 140 01/12/2012 1321   K 3.5 01/12/2012 1321   CL 102 01/12/2012 1321   CO2 29 01/12/2012 1321   BUN 10 01/12/2012 1321   CREATININE 0.59 01/12/2012 1321      Component Value Date/Time   CALCIUM 9.6 01/12/2012 1321   ALKPHOS 132* 01/12/2012 1321   AST 23 01/12/2012 1321  ALT 43* 01/12/2012 1321   BILITOT 0.2* 01/12/2012 1321      Lab Results  Component Value Date   LABCA2 36 12/29/2011   Echocardiogram: 12/15/11 Study Conclusions - Left ventricle: Systolic function was normal. The estimated ejection fraction was in the range of 55% to 60%. Wall motion was normal; there were no regional wall motion abnormalities. Doppler parameters are consistent with abnormal left ventricular relaxation (grade 1 diastolic dysfunction). - Aortic valve: There was no stenosis. Trivial regurgitation. - Mitral valve: Mild regurgitation. - Left atrium: The atrium was at the upper limits of normal in size. - Right ventricle: The cavity size was normal. Systolic function was normal. - Pulmonary arteries: No complete TR doppler jet so unable to estimate PA systolic pressure. - Inferior vena cava: The vessel was normal in size; the respirophasic diameter changes were in the normal range (= 50%); findings are consistent with normal central venous pressure. Impressions: - Normal LV size and systolic function, EF 55-60%. Normal RV size and systolic function. No significant valvular dysfunction. Prepared and Electronically Authenticated by Marca Ancona, MD     Assessment:  [name] Riley is a 61 year-old [place] woman with a history of a T2 N1a infiltrating ductal carcinoma of the left breast for which she underwent a left modified radical mastectomy with axillary node dissection which revealed 3/18 nodes with metastatic disease and evidence of extracapsular extension, ER/PR positive at 89/81% respectively, Ki-67 at 79%, HER-2 negative. Right simple mastectomy was clear. She is currently day 1 cycle 2/4 planned adjuvant q3wk FEC. Neulasta support on day 2. 2. History of profound afebrile neutropenia.  Case reviewed with Dr. Pierce Crane.   Plan:  [name] Riley will receive day 1 cycle 2 of adjuvant every 3 week FEC to day schedule. She will then present on day 2 for Neulasta support. She will initiate Cipro 500 mg by mouth twice a day for neutropenic precautions on 01/17/2012, I will see her officially in 1 week's time for nadir assessment. She knows to contact us sooner if the need should arise. This plan was reviewed with the patient, who voices understanding and agreement.  She knows to call with any changes or problems.    Roe Wilner T, PA-C 01/12/12

## 2012-01-16 ENCOUNTER — Other Ambulatory Visit: Payer: Self-pay | Admitting: Certified Registered Nurse Anesthetist

## 2012-01-19 ENCOUNTER — Encounter: Payer: Self-pay | Admitting: Physician Assistant

## 2012-01-19 ENCOUNTER — Other Ambulatory Visit (HOSPITAL_BASED_OUTPATIENT_CLINIC_OR_DEPARTMENT_OTHER): Payer: Medicaid Other | Admitting: Lab

## 2012-01-19 ENCOUNTER — Ambulatory Visit (HOSPITAL_BASED_OUTPATIENT_CLINIC_OR_DEPARTMENT_OTHER): Payer: Medicaid Other | Admitting: Physician Assistant

## 2012-01-19 ENCOUNTER — Telehealth: Payer: Self-pay | Admitting: *Deleted

## 2012-01-19 VITALS — BP 165/90 | HR 96 | Temp 98.3°F | Ht 68.0 in | Wt 181.1 lb

## 2012-01-19 DIAGNOSIS — S30860A Insect bite (nonvenomous) of lower back and pelvis, initial encounter: Secondary | ICD-10-CM

## 2012-01-19 DIAGNOSIS — C50919 Malignant neoplasm of unspecified site of unspecified female breast: Secondary | ICD-10-CM

## 2012-01-19 DIAGNOSIS — W57XXXA Bitten or stung by nonvenomous insect and other nonvenomous arthropods, initial encounter: Secondary | ICD-10-CM

## 2012-01-19 DIAGNOSIS — D709 Neutropenia, unspecified: Secondary | ICD-10-CM

## 2012-01-19 LAB — CBC WITH DIFFERENTIAL/PLATELET
BASO%: 0.8 % (ref 0.0–2.0)
Basophils Absolute: 0 10*3/uL (ref 0.0–0.1)
EOS%: 2.2 % (ref 0.0–7.0)
Eosinophils Absolute: 0 10*3/uL (ref 0.0–0.5)
HCT: 35.3 % (ref 34.8–46.6)
HGB: 11.9 g/dL (ref 11.6–15.9)
LYMPH%: 69.5 % — ABNORMAL HIGH (ref 14.0–49.7)
MCH: 31 pg (ref 25.1–34.0)
MCHC: 33.8 g/dL (ref 31.5–36.0)
MCV: 91.8 fL (ref 79.5–101.0)
MONO#: 0 10*3/uL — ABNORMAL LOW (ref 0.1–0.9)
MONO%: 4.2 % (ref 0.0–14.0)
NEUT#: 0.2 10*3/uL — CL (ref 1.5–6.5)
NEUT%: 23.3 % — ABNORMAL LOW (ref 38.4–76.8)
Platelets: 148 10*3/uL (ref 145–400)
RBC: 3.85 10*6/uL (ref 3.70–5.45)
RDW: 13.3 % (ref 11.2–14.5)
WBC: 1 10*3/uL — ABNORMAL LOW (ref 3.9–10.3)
lymph#: 0.7 10*3/uL — ABNORMAL LOW (ref 0.9–3.3)
nRBC: 0 % (ref 0–0)

## 2012-01-19 NOTE — Progress Notes (Signed)
Hematology and Oncology Follow Up Visit  [name] Riley [id number] [birth year] 61 y.o. 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 Right simple mastectomy was clear. She is currently day 7 cycle 2/4 planned adjuvant q3wk FEC. Neulasta support on day 2. 2. History of profound afebrile neutropenia, currently day 6 Neulasta and D2 Cipro 500 mg by mouth twice a day.  Interim History:   [name] Riley is seen today with her husband accompaniment for followup after her second of 4 planned doses of every 3 week FEC given in the adjuvant setting. She is feeling quite well today, denying any fevers, chills, or night sweats. No shortness of breath or chest pain issues. She denies nausea, emesis, diarrhea or constipation. No long bone pain. No bleeding or bruising symptoms. She initiated Cipro 500 mg by mouth twice a day on 01/17/2012. She does admit, she sustained a tick bite over the last 5-7 days, it was in her left midback region. She notes that the area is itching, but no other side effects. A detailed review of systems is otherwise noncontributory as noted below.  Review of Systems: Constitutional:  Fatigued, but otherwise feels well. Eyes: uses glasses ENT: no complaints Cardiovascular: no chest pain or dyspnea on exertion Respiratory: no cough, shortness of breath, or wheezing Neurological: no TIA or stroke symptoms Dermatological: negative Gastrointestinal: no abdominal pain, change in bowel habits, or black or bloody stools Genito-Urinary: no dysuria, trouble voiding, or hematuria Hematological and Lymphatic: negative Breast: s/p B  mastectomies. Musculoskeletal: negative Remaining ROS negative.   Medications:   I have reviewed the patient's current medications.  Current Outpatient Prescriptions  Medication Sig Dispense Refill  . aspirin 81 MG tablet Take 81 mg by mouth daily.        Marland Kitchen dexamethasone (DECADRON) 4 MG tablet Take 2 tablets by mouth once a day on the day after chemotherapy and then take 2 tablets two times a day for 2 days. Take with food.  30 tablet  1  . doxycycline (DORYX) 100 MG DR capsule Take 100 mg by mouth 2 (two) times daily.      . fish oil-omega-3 fatty acids 1000 MG capsule Take 2 g by mouth at bedtime.       Marland Kitchen levothyroxine (SYNTHROID, LEVOTHROID) 75 MCG tablet Take 75 mcg by mouth daily.       Marland Kitchen lidocaine-prilocaine (EMLA) cream Apply topically as needed. APPLY TO PAC 1-2 HOURS PRIOR TO ACCESS  30 g  1  . LORazepam (ATIVAN) 0.5 MG tablet Take 1 tablet (0.5 mg total) by mouth every 6 (six) hours as needed (Nausea or vomiting).  30 tablet  0  . metoprolol (TOPROL-XL) 100 MG 24 hr tablet Take 100 mg by mouth daily.       . Multiple Vitamins-Minerals (MULTIVITAMIN WITH MINERALS) tablet Take 1 tablet by mouth daily.        . ondansetron (ZOFRAN) 8 MG tablet Take 1 tablet two times a day as needed for nausea or vomiting starting on the third day after chemotherapy.  30 tablet  1  . prochlorperazine (COMPAZINE) 10 MG tablet Take 1 tablet (10 mg total) by mouth every 6 (six) hours as  needed (Nausea or vomiting).  30 tablet  1  . prochlorperazine (COMPAZINE) 25 MG suppository Place 1 suppository (25 mg total) rectally every 12 (twelve) hours as needed for nausea.  12 suppository  3   No current facility-administered medications for this visit.   Facility-Administered Medications Ordered in Other Visits  Medication Dose Route Frequency Provider Last Rate Last Dose  . lidocaine-prilocaine (EMLA) cream   Topical Once Pierce Crane, MD        Allergies:  Allergies  Allergen Reactions  . Contrast  Media (Iodinated Diagnostic Agents) Shortness Of Breath and Rash  . Food Shortness Of Breath    Cantaloupe    Physical Exam: Filed Vitals:   01/19/12 0951  BP: 165/90  Pulse: 96  Temp: 98.3 F (36.8 C)    Body mass index is 27.54 kg/(m^2). Weight: 181 lbs. HEENT:  Sclerae anicteric, conjunctivae pink.  Oropharynx clear.  No mucositis or candidiasis.   Nodes:  No cervical, supraclavicular, or axillary lymphadenopathy palpated.  Breast Exam:  Deferred Lungs:  Clear to auscultation bilaterally.  No crackles, rhonchi, or wheezes.   Heart:  Regular rate and rhythm.   Abdomen:  Soft, nontender.  Positive bowel sounds.  No organomegaly or masses palpated.   Musculoskeletal:  No focal spinal tenderness to palpation.  Extremities:  Benign.  No peripheral edema or cyanosis.   Skin:  Benign, though the region in her left midback with a tick bite occurred was examined, except for some very minimal erythema around the site and mild edema, there is no evidence to suggest any renal lesions, targets, or blisters.   Neuro:  Nonfocal, alert and oriented x 3.   Lab Results: Lab Results  Component Value Date   WBC 1.0* 01/19/2012   HGB 11.9 01/19/2012   HCT 35.3 01/19/2012   MCV 91.8 01/19/2012   PLT 148 01/19/2012   NEUTROABS 0.2* 01/19/2012     Chemistry      Component Value Date/Time   NA 140 01/12/2012 1321   K 3.5 01/12/2012 1321   CL 102 01/12/2012 1321   CO2 29 01/12/2012 1321   BUN 10 01/12/2012 1321   CREATININE 0.59 01/12/2012 1321      Component Value Date/Time   CALCIUM 9.6 01/12/2012 1321   ALKPHOS 132* 01/12/2012 1321   AST 23 01/12/2012 1321   ALT 43* 01/12/2012 1321   BILITOT 0.2* 01/12/2012 1321      Lab Results  Component Value Date   LABCA2 36 12/29/2011   Echocardiogram: 12/15/11 Study Conclusions - Left ventricle: Systolic function was normal. The estimated ejection fraction was in the range of 55% to 60%. Wall motion was normal; there were no regional wall motion  abnormalities. Doppler parameters are consistent with abnormal left ventricular relaxation (grade 1 diastolic dysfunction). - Aortic valve: There was no stenosis. Trivial regurgitation. - Mitral valve: Mild regurgitation. - Left atrium: The atrium was at the upper limits of normal in size. - Right ventricle: The cavity size was normal. Systolic function was normal. - Pulmonary arteries: No complete TR doppler jet so unable to estimate PA systolic pressure. - Inferior vena cava: The vessel was normal in size; the respirophasic diameter changes were in the normal range (= 50%); findings are consistent with normal central venous pressure. Impressions: - Normal LV size and systolic function, EF 55-60%. Normal RV size and systolic function. No significant valvular dysfunction. 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 Right simple mastectomy was clear. She is currently day 7 cycle 2/4 planned adjuvant q3wk FEC. Neulasta support on day 2. 2.  Profound afebrile neutropenia, D6 Neulasta, D2 Cipro 500 mg by mouth twice a day.  3. Tick bite left mid back.  Case reviewed with Dr. Pierce Crane.   Plan:  Tressia will continue on Cipro 500 mg by mouth twice a day and follow neutropenic cautions through 01/24/2012.  We'll add doxycycline 100 mg by mouth twice a day for 7 days. Information pertain to [facility] in spite of fever and Lyme disease were provided to patient today, though we have a low-grade of suspicion. I will see Sahian back in 2 weeks' time prior to day 1 cycle 3 of every 3  week FEC given in the adjuvant setting, though we will see her prior if the need should arise. This plan was reviewed with the patient, who voices understanding and agreement.  She knows to call with any changes or problems.    Karsen Nakanishi T, PA-C 01/12/12

## 2012-01-19 NOTE — Patient Instructions (Signed)
   [place] Spotted Fever SYMPTOMS  Symptoms of RMSF begin from 2 to 14 days after a tick bite. The most common early symptoms are fever, muscle aches and headache followed by nausea (feeling sick to your stomach) or vomiting.  The RMSF rash is typically delayed until 3 or more days after symptom onset, and eventually develops in 9 of 10 infected patients by the 5th day of illness.   We will start you on Doxycycline 100mg  twice a day for 7 days.  Continue Cipro 500mg  twice a day through 01/24/12 in the am.   Lyme Disease is a disease that may affect many body systems. Because of the small size of the biting tick, most people do not notice being bitten. The first sign of an infection is usually a round red rash that extends out from the center of the tick bite. The center of the lesion may be blood colored (hemorrhagic) or have tiny blisters (vesicular). Most lesions have bright red outer borders and partial central clearing. This rash may extend out many inches in diameter, and multiple lesions may be present. Other symptoms such as fatigue, headaches, chills and fever, general achiness and swelling of lymph glands may also occur.

## 2012-01-19 NOTE — Telephone Encounter (Signed)
gave patient appointment for 02-27-2012 lab midlevel treatment 02-28-2012 injection printed out calendar and gave to the patient

## 2012-01-25 ENCOUNTER — Telehealth (INDEPENDENT_AMBULATORY_CARE_PROVIDER_SITE_OTHER): Payer: Self-pay

## 2012-01-25 NOTE — Telephone Encounter (Signed)
The patient called and stated she is going to 2nd to [facility] Friday to get fitted for mastectomy bras and prosthesis.  She would like a prescription faxed to them for "mastectomy supplies" to fax # [phone].

## 2012-01-26 ENCOUNTER — Telehealth (INDEPENDENT_AMBULATORY_CARE_PROVIDER_SITE_OTHER): Payer: Self-pay | Admitting: Surgery

## 2012-01-26 NOTE — Telephone Encounter (Signed)
Faxed Rx to second to [facility] for Mastectomy and Prosthesis [name] Riley

## 2012-02-02 ENCOUNTER — Encounter: Payer: Self-pay | Admitting: Physician Assistant

## 2012-02-02 ENCOUNTER — Ambulatory Visit (HOSPITAL_BASED_OUTPATIENT_CLINIC_OR_DEPARTMENT_OTHER): Payer: Medicaid Other

## 2012-02-02 ENCOUNTER — Other Ambulatory Visit: Payer: Medicaid Other | Admitting: Lab

## 2012-02-02 ENCOUNTER — Ambulatory Visit (HOSPITAL_BASED_OUTPATIENT_CLINIC_OR_DEPARTMENT_OTHER): Payer: Medicaid Other | Admitting: Physician Assistant

## 2012-02-02 ENCOUNTER — Telehealth: Payer: Self-pay | Admitting: *Deleted

## 2012-02-02 VITALS — BP 163/82 | HR 90 | Temp 98.9°F | Ht 68.0 in | Wt 184.7 lb

## 2012-02-02 DIAGNOSIS — C50419 Malignant neoplasm of upper-outer quadrant of unspecified female breast: Secondary | ICD-10-CM

## 2012-02-02 DIAGNOSIS — Z170 Estrogen receptor positive status [ER+]: Secondary | ICD-10-CM

## 2012-02-02 DIAGNOSIS — C50912 Malignant neoplasm of unspecified site of left female breast: Secondary | ICD-10-CM

## 2012-02-02 DIAGNOSIS — C50919 Malignant neoplasm of unspecified site of unspecified female breast: Secondary | ICD-10-CM

## 2012-02-02 DIAGNOSIS — Z5111 Encounter for antineoplastic chemotherapy: Secondary | ICD-10-CM

## 2012-02-02 LAB — CBC WITH DIFFERENTIAL/PLATELET
BASO%: 0.8 % (ref 0.0–2.0)
Basophils Absolute: 0.1 10*3/uL (ref 0.0–0.1)
EOS%: 0.3 % (ref 0.0–7.0)
Eosinophils Absolute: 0 10*3/uL (ref 0.0–0.5)
HCT: 32.2 % — ABNORMAL LOW (ref 34.8–46.6)
HGB: 11.2 g/dL — ABNORMAL LOW (ref 11.6–15.9)
LYMPH%: 22.8 % (ref 14.0–49.7)
MCH: 31.1 pg (ref 25.1–34.0)
MCHC: 34.8 g/dL (ref 31.5–36.0)
MCV: 89.4 fL (ref 79.5–101.0)
MONO#: 1.1 10*3/uL — ABNORMAL HIGH (ref 0.1–0.9)
MONO%: 15.1 % — ABNORMAL HIGH (ref 0.0–14.0)
NEUT#: 4.3 10*3/uL (ref 1.5–6.5)
NEUT%: 61 % (ref 38.4–76.8)
Platelets: 299 10*3/uL (ref 145–400)
RBC: 3.6 10*6/uL — ABNORMAL LOW (ref 3.70–5.45)
RDW: 14.8 % — ABNORMAL HIGH (ref 11.2–14.5)
WBC: 7.1 10*3/uL (ref 3.9–10.3)
lymph#: 1.6 10*3/uL (ref 0.9–3.3)
nRBC: 0 % (ref 0–0)

## 2012-02-02 LAB — COMPREHENSIVE METABOLIC PANEL
ALT: 50 U/L — ABNORMAL HIGH (ref 0–35)
AST: 27 U/L (ref 0–37)
Albumin: 4.4 g/dL (ref 3.5–5.2)
Alkaline Phosphatase: 98 U/L (ref 39–117)
BUN: 13 mg/dL (ref 6–23)
CO2: 29 mEq/L (ref 19–32)
Calcium: 9.1 mg/dL (ref 8.4–10.5)
Chloride: 104 mEq/L (ref 96–112)
Creatinine, Ser: 0.62 mg/dL (ref 0.50–1.10)
Glucose, Bld: 106 mg/dL — ABNORMAL HIGH (ref 70–99)
Potassium: 3.6 mEq/L (ref 3.5–5.3)
Sodium: 139 mEq/L (ref 135–145)
Total Bilirubin: 0.3 mg/dL (ref 0.3–1.2)
Total Protein: 6.8 g/dL (ref 6.0–8.3)

## 2012-02-02 LAB — LACTATE DEHYDROGENASE: LDH: 234 U/L (ref 94–250)

## 2012-02-02 MED ORDER — HEPARIN SOD (PORK) LOCK FLUSH 100 UNIT/ML IV SOLN
500.0000 [IU] | Freq: Once | INTRAVENOUS | Status: AC | PRN
  Administered 2012-02-02: 500 [IU]
  Filled 2012-02-02: qty 5

## 2012-02-02 MED ORDER — SODIUM CHLORIDE 0.9 % IV SOLN
Freq: Once | INTRAVENOUS | Status: AC
  Administered 2012-02-02: 15:00:00 via INTRAVENOUS

## 2012-02-02 MED ORDER — FLUOROURACIL CHEMO INJECTION 2.5 GM/50ML
500.0000 mg/m2 | Freq: Once | INTRAVENOUS | Status: AC
  Administered 2012-02-02: 1000 mg via INTRAVENOUS
  Filled 2012-02-02: qty 20

## 2012-02-02 MED ORDER — DEXAMETHASONE SODIUM PHOSPHATE 4 MG/ML IJ SOLN
12.0000 mg | Freq: Once | INTRAMUSCULAR | Status: AC
  Administered 2012-02-02: 12 mg via INTRAVENOUS

## 2012-02-02 MED ORDER — SODIUM CHLORIDE 0.9 % IV SOLN
150.0000 mg | Freq: Once | INTRAVENOUS | Status: AC
  Administered 2012-02-02: 150 mg via INTRAVENOUS
  Filled 2012-02-02: qty 5

## 2012-02-02 MED ORDER — LORAZEPAM 2 MG/ML IJ SOLN
0.5000 mg | Freq: Once | INTRAMUSCULAR | Status: AC
  Administered 2012-02-02: 0.5 mg via INTRAVENOUS

## 2012-02-02 MED ORDER — SODIUM CHLORIDE 0.9 % IV SOLN
500.0000 mg/m2 | Freq: Once | INTRAVENOUS | Status: AC
  Administered 2012-02-02: 980 mg via INTRAVENOUS
  Filled 2012-02-02: qty 49

## 2012-02-02 MED ORDER — PALONOSETRON HCL INJECTION 0.25 MG/5ML
0.2500 mg | Freq: Once | INTRAVENOUS | Status: AC
  Administered 2012-02-02: 0.25 mg via INTRAVENOUS

## 2012-02-02 MED ORDER — SODIUM CHLORIDE 0.9 % IJ SOLN
10.0000 mL | INTRAMUSCULAR | Status: DC | PRN
  Administered 2012-02-02: 10 mL
  Filled 2012-02-02: qty 10

## 2012-02-02 MED ORDER — EPIRUBICIN HCL CHEMO IV INJECTION 200 MG/100ML
100.0000 mg/m2 | Freq: Once | INTRAVENOUS | Status: AC
  Administered 2012-02-02: 198 mg via INTRAVENOUS
  Filled 2012-02-02: qty 99

## 2012-02-02 NOTE — Patient Instructions (Signed)
[facility] Discharge Instructions for Patients Receiving Chemotherapy  Today you received the following chemotherapy agents; Ellence, Adrucil and Cytoxan.  To help prevent nausea and vomiting after your treatment, we encourage you to take your nausea medication per your doctor's instructions. Begin taking it as often as prescribed.   If you develop nausea and vomiting that is not controlled by your nausea medication, call the clinic. If it is after clinic hours your family physician or the after hours number for the clinic or go to the Emergency Department.   BELOW ARE SYMPTOMS THAT SHOULD BE REPORTED IMMEDIATELY:  *FEVER GREATER THAN 100.5 F  *CHILLS WITH OR WITHOUT FEVER  NAUSEA AND VOMITING THAT IS NOT CONTROLLED WITH YOUR NAUSEA MEDICATION  *UNUSUAL SHORTNESS OF BREATH  *UNUSUAL BRUISING OR BLEEDING  TENDERNESS IN MOUTH AND THROAT WITH OR WITHOUT PRESENCE OF ULCERS  *URINARY PROBLEMS  *BOWEL PROBLEMS  UNUSUAL RASH Items with * indicate a potential emergency and should be followed up as soon as possible.  One of the nurses will contact you 24 hours after your treatment. Please let the nurse know about any problems that you may have experienced. Feel free to call the clinic you have any questions or concerns. The clinic phone number is [phone].   I have been informed and understand all the instructions given to me. I know to contact the clinic, my physician, or go to the Emergency Department if any problems should occur. I do not have any questions at this time, but understand that I may call the clinic during office hours   should I have any questions or need assistance in obtaining follow up care.    __________________________________________  _____________  __________ Signature of Patient or Authorized Representative            Date                   Time    __________________________________________ Nurse's Signature

## 2012-02-02 NOTE — Progress Notes (Signed)
Hematology and Oncology Follow Up Visit  [name] Riley [id number] 29-Jan-1951 61 y.o. 02/02/2012    HPI: [name] Riley is a 61 year-old [place] woman with a history of a T2 N1a infiltrating ductal carcinoma of the left breast for which she underwent a left modified radical mastectomy with axillary node dissection which revealed 3/18 nodes with metastatic disease and evidence of extracapsular extension, ER/PR positive at 89/81% respectively, Ki-67 at 79%, HER-2 negative. Right simple mastectomy was clear. She is currently day 1 cycle 3/4 planned adjuvant q3wk FEC. Neulasta support on day 2.  2. History of profound afebrile neutropenia.  3. History of tick bite of the left mid back, completion of doxycycline 100 mg by mouth twice a day x7 days.  Case reviewed with Dr. Pierce Crane.   Interim History:   [name] Riley is seen today with her husband accompaniment for followup prior to her third of 4 planned doses of every 3 week FEC given in the adjuvant setting. She is feeling quite well today, denying any fevers, chills, or night sweats. No shortness of breath or chest pain issues. She denies nausea, emesis, diarrhea or constipation. No long bone pain. No bleeding or bruising symptoms. She tolerated her course of doxycycline for her to bite without difficulties.. She knows that she'll be initiating Cipro for neutropenic precautions prior to her week followup. A detailed review of systems is otherwise noncontributory as noted below.  Review of Systems: Constitutional: Feels well. Eyes: uses glasses ENT: no complaints Cardiovascular: no chest pain or dyspnea on exertion Respiratory: no cough, shortness of breath, or wheezing Neurological: no TIA or stroke symptoms Dermatological: negative Gastrointestinal: no abdominal pain, change in bowel habits, or black or bloody stools Genito-Urinary: no dysuria, trouble voiding, or hematuria Hematological and Lymphatic: negative Breast: s/p B  mastectomies. Musculoskeletal: negative Remaining ROS negative.   Medications:   I have reviewed the patient's current medications.  Current Outpatient Prescriptions  Medication Sig Dispense Refill  . aspirin 81 MG tablet Take 81 mg by mouth daily.        Marland Kitchen dexamethasone (DECADRON) 4 MG tablet Take 2 tablets by mouth once a day on the day after chemotherapy and then take 2 tablets two times a day for 2 days. Take with food.  30 tablet  1  . doxycycline (DORYX) 100 MG DR capsule Take 100 mg by mouth 2 (two) times daily.      . fish oil-omega-3 fatty acids 1000 MG capsule Take 2 g by mouth at bedtime.       Marland Kitchen levothyroxine (SYNTHROID, LEVOTHROID) 75 MCG tablet Take 75 mcg by mouth daily.       Marland Kitchen lidocaine-prilocaine (EMLA) cream Apply topically as needed. APPLY TO PAC 1-2 HOURS PRIOR TO ACCESS  30 g  1  . LORazepam (ATIVAN) 0.5 MG tablet Take 1 tablet (0.5 mg total) by mouth every 6 (six) hours as needed (Nausea or vomiting).  30 tablet  0  . metoprolol (TOPROL-XL) 100 MG 24 hr tablet Take 100 mg by mouth daily.       . Multiple Vitamins-Minerals (MULTIVITAMIN WITH MINERALS) tablet Take 1 tablet by mouth daily.        . ondansetron (ZOFRAN) 8 MG tablet Take 1 tablet two times a day as needed for nausea or vomiting starting on the third day after chemotherapy.  30 tablet  1  . prochlorperazine (COMPAZINE) 10 MG tablet Take 1 tablet (10 mg total) by mouth every 6 (six) hours as needed (Nausea  or vomiting).  30 tablet  1  . prochlorperazine (COMPAZINE) 25 MG suppository Place 1 suppository (25 mg total) rectally every 12 (twelve) hours as needed for nausea.  12 suppository  3   No current facility-administered medications for this visit.   Facility-Administered Medications Ordered in Other Visits  Medication Dose Route Frequency Provider Last Rate Last Dose  . lidocaine-prilocaine (EMLA) cream   Topical Once Pierce Crane, MD        Allergies:  Allergies  Allergen Reactions  . Contrast  Media (Iodinated Diagnostic Agents) Shortness Of Breath and Rash  . Food Shortness Of Breath    Cantaloupe    Physical Exam: Filed Vitals:   02/02/12 1407  BP: 163/82  Pulse: 90  Temp: 98.9 F (37.2 C)    Body mass index is 28.08 kg/(m^2). Weight: 184 lbs. HEENT:  Sclerae anicteric, conjunctivae pink.  Oropharynx clear.  No mucositis or candidiasis.   Nodes:  No cervical, supraclavicular, or axillary lymphadenopathy palpated.  Breast Exam:  Deferred Lungs:  Clear to auscultation bilaterally.  No crackles, rhonchi, or wheezes.   Heart:  Regular rate and rhythm.   Abdomen:  Soft, nontender.  Positive bowel sounds.  No organomegaly or masses palpated.   Musculoskeletal:  No focal spinal tenderness to palpation.  Extremities:  Benign.  No peripheral edema or cyanosis, no evidence of lymphedema of the left upper extremity at this time. Skin:  Benign.   Neuro:  Nonfocal, alert and oriented x 3.   Lab Results: Lab Results  Component Value Date   WBC 7.1 02/02/2012   HGB 11.2* 02/02/2012   HCT 32.2* 02/02/2012   MCV 89.4 02/02/2012   PLT 299 02/02/2012   NEUTROABS 4.3 02/02/2012     Chemistry      Component Value Date/Time   NA 140 01/12/2012 1321   K 3.5 01/12/2012 1321   CL 102 01/12/2012 1321   CO2 29 01/12/2012 1321   BUN 10 01/12/2012 1321   CREATININE 0.59 01/12/2012 1321      Component Value Date/Time   CALCIUM 9.6 01/12/2012 1321   ALKPHOS 132* 01/12/2012 1321   AST 23 01/12/2012 1321   ALT 43* 01/12/2012 1321   BILITOT 0.2* 01/12/2012 1321      Lab Results  Component Value Date   LABCA2 36 12/29/2011   Echocardiogram: 12/15/11 Study Conclusions - Left ventricle: Systolic function was normal. The estimated ejection fraction was in the range of 55% to 60%. Wall motion was normal; there were no regional wall motion abnormalities. Doppler parameters are consistent with abnormal left ventricular relaxation (grade 1 diastolic dysfunction). - Aortic valve: There was no  stenosis. Trivial regurgitation. - Mitral valve: Mild regurgitation. - Left atrium: The atrium was at the upper limits of normal in size. - Right ventricle: The cavity size was normal. Systolic function was normal. - Pulmonary arteries: No complete TR doppler jet so unable to estimate PA systolic pressure. - Inferior vena cava: The vessel was normal in size; the respirophasic diameter changes were in the normal range (= 50%); findings are consistent with normal central venous pressure. Impressions: - Normal LV size and systolic function, EF 55-60%. Normal RV size and systolic function. No significant valvular dysfunction. Prepared and Electronically Authenticated by Marca Ancona, MD     Assessment:  [name] Riley is a 61 year-old [place] woman with a history of a T2 N1a infiltrating ductal carcinoma of the left breast for which she underwent a left modified radical mastectomy with axillary  node dissection which revealed 3/18 nodes with metastatic disease and evidence of extracapsular extension, ER/PR positive at 89/81% respectively, Ki-67 at 79%, HER-2 negative. Right simple mastectomy was clear. She is currently day 1 cycle 3/4 planned adjuvant q3wk FEC. Neulasta support on day 2.  2. History of profound afebrile neutropenia.  3. History of tick bite left mid back, completion of seven-day course of doxycycline 100 mg by mouth twice a day.  Case reviewed with Dr. Pierce Crane.   Plan:  [name] Riley will receive day 1 cycle 3 of adjuvant every 3 week FEC to day schedule. She will then present on day 2 for Neulasta support. She will initiate Cipro 500 mg by mouth twice a day for neutropenic precautions on 02/07/2012, I will see her officially in 1 week's time for nadir assessment.  We will refer her to the lymphedema clinic, in an effort to be proactive for development of lymphedema of the left upper extremity down the line. This plan was reviewed with the patient, who voices  understanding and agreement.  She knows to call with any changes or problems.    [name] Riley T, PA-C 02/02/12

## 2012-02-02 NOTE — Telephone Encounter (Signed)
spoke with [name] Riley at the [facility] gave her the information that the patient's appointments need to be after 2:00pm

## 2012-02-02 NOTE — Patient Instructions (Signed)
1. Please start Cipro 500mg  twice a day on 02/07/12  2. Take Claritin as last cycle

## 2012-02-03 ENCOUNTER — Ambulatory Visit (HOSPITAL_BASED_OUTPATIENT_CLINIC_OR_DEPARTMENT_OTHER): Payer: Medicaid Other

## 2012-02-03 VITALS — BP 144/82 | HR 93 | Temp 97.8°F

## 2012-02-03 DIAGNOSIS — C50912 Malignant neoplasm of unspecified site of left female breast: Secondary | ICD-10-CM

## 2012-02-03 DIAGNOSIS — C50919 Malignant neoplasm of unspecified site of unspecified female breast: Secondary | ICD-10-CM

## 2012-02-03 MED ORDER — PEGFILGRASTIM INJECTION 6 MG/0.6ML
6.0000 mg | Freq: Once | SUBCUTANEOUS | Status: AC
  Administered 2012-02-03: 6 mg via SUBCUTANEOUS
  Filled 2012-02-03: qty 0.6

## 2012-02-09 ENCOUNTER — Ambulatory Visit (HOSPITAL_BASED_OUTPATIENT_CLINIC_OR_DEPARTMENT_OTHER): Payer: Medicaid Other | Admitting: Physician Assistant

## 2012-02-09 ENCOUNTER — Encounter: Payer: Self-pay | Admitting: Physician Assistant

## 2012-02-09 ENCOUNTER — Other Ambulatory Visit (HOSPITAL_BASED_OUTPATIENT_CLINIC_OR_DEPARTMENT_OTHER): Payer: Medicaid Other | Admitting: Lab

## 2012-02-09 VITALS — BP 172/78 | HR 94 | Temp 99.3°F | Ht 68.0 in | Wt 179.3 lb

## 2012-02-09 DIAGNOSIS — Z170 Estrogen receptor positive status [ER+]: Secondary | ICD-10-CM

## 2012-02-09 DIAGNOSIS — C50912 Malignant neoplasm of unspecified site of left female breast: Secondary | ICD-10-CM

## 2012-02-09 DIAGNOSIS — C50919 Malignant neoplasm of unspecified site of unspecified female breast: Secondary | ICD-10-CM

## 2012-02-09 DIAGNOSIS — C50419 Malignant neoplasm of upper-outer quadrant of unspecified female breast: Secondary | ICD-10-CM

## 2012-02-09 DIAGNOSIS — C773 Secondary and unspecified malignant neoplasm of axilla and upper limb lymph nodes: Secondary | ICD-10-CM

## 2012-02-09 LAB — CBC WITH DIFFERENTIAL/PLATELET
BASO%: 0.8 % (ref 0.0–2.0)
Basophils Absolute: 0 10*3/uL (ref 0.0–0.1)
EOS%: 2.7 % (ref 0.0–7.0)
Eosinophils Absolute: 0 10*3/uL (ref 0.0–0.5)
HCT: 32.2 % — ABNORMAL LOW (ref 34.8–46.6)
HGB: 11.1 g/dL — ABNORMAL LOW (ref 11.6–15.9)
LYMPH%: 53.8 % — ABNORMAL HIGH (ref 14.0–49.7)
MCH: 31.7 pg (ref 25.1–34.0)
MCHC: 34.6 g/dL (ref 31.5–36.0)
MCV: 91.5 fL (ref 79.5–101.0)
MONO#: 0.1 10*3/uL (ref 0.1–0.9)
MONO%: 8.7 % (ref 0.0–14.0)
NEUT#: 0.4 10*3/uL — CL (ref 1.5–6.5)
NEUT%: 34 % — ABNORMAL LOW (ref 38.4–76.8)
Platelets: 137 10*3/uL — ABNORMAL LOW (ref 145–400)
RBC: 3.51 10*6/uL — ABNORMAL LOW (ref 3.70–5.45)
RDW: 15.2 % — ABNORMAL HIGH (ref 11.2–14.5)
WBC: 1.2 10*3/uL — ABNORMAL LOW (ref 3.9–10.3)
lymph#: 0.6 10*3/uL — ABNORMAL LOW (ref 0.9–3.3)
nRBC: 1 % — ABNORMAL HIGH (ref 0–0)

## 2012-02-09 NOTE — Patient Instructions (Signed)
1. Crystallized ginger (produce or baking section)  2. Moniter temps, continue Cipro through Sunday 02/12/12.

## 2012-02-09 NOTE — Progress Notes (Signed)
Hematology and Oncology Follow Up Visit  [name] Riley [id number] 27-Apr-1951 61 y.o. 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 Right simple mastectomy was clear. She is currently day 7 cycle 3/4 planned adjuvant q3wk FEC. Neulasta support on day 2. 2. History of profound afebrile neutropenia, currently day 6 Neulasta and D2 Cipro 500 mg by mouth twice a day.  Interim History:   [name] Riley is seen today with her husband accompaniment for followup after her second of 4 planned doses of every 3 week FEC given in the adjuvant setting. She is feeling quite well today, denying any fevers, chills, or night sweats. No shortness of breath or chest pain issues. She denies nausea, emesis, diarrhea or constipation. No long bone pain. No bleeding or bruising symptoms. She initiated Cipro 500 mg by mouth twice a day on 02/07/2012.  A detailed review of systems is otherwise noncontributory as noted below.  Review of Systems: Constitutional:  Fatigued, but otherwise feels well. Eyes: uses glasses ENT: no complaints Cardiovascular: no chest pain or dyspnea on exertion Respiratory: no cough, shortness of breath, or wheezing Neurological: no TIA or stroke symptoms Dermatological: negative Gastrointestinal: no abdominal pain, change in bowel habits, or black or bloody stools Genito-Urinary: no dysuria, trouble voiding, or hematuria Hematological and Lymphatic: negative Breast: s/p B mastectomies. Musculoskeletal: negative Remaining ROS negative.   Medications:   I have reviewed the patient's current medications.  Current Outpatient Prescriptions  Medication  Sig Dispense Refill  . dexamethasone (DECADRON) 4 MG tablet Take 2 tablets by mouth once a day on the day after chemotherapy and then take 2 tablets two times a day for 2 days. Take with food.  30 tablet  1  . doxycycline (DORYX) 100 MG DR capsule Take 100 mg by mouth 2 (two) times daily.      Marland Kitchen levothyroxine (SYNTHROID, LEVOTHROID) 75 MCG tablet Take 75 mcg by mouth daily.       Marland Kitchen lidocaine-prilocaine (EMLA) cream Apply topically as needed. APPLY TO PAC 1-2 HOURS PRIOR TO ACCESS  30 g  1  . LORazepam (ATIVAN) 0.5 MG tablet Take 1 tablet (0.5 mg total) by mouth every 6 (six) hours as needed (Nausea or vomiting).  30 tablet  0  . metoprolol (TOPROL-XL) 100 MG 24 hr tablet Take 100 mg by mouth daily.       . Multiple Vitamins-Minerals (MULTIVITAMIN WITH MINERALS) tablet Take 1 tablet by mouth daily.        . ondansetron (ZOFRAN) 8 MG tablet Take 1 tablet two times a day as needed for nausea or vomiting starting on the third day after chemotherapy.  30 tablet  1  . aspirin 81 MG tablet Take 81 mg by mouth daily.        . fish oil-omega-3 fatty acids 1000 MG capsule Take 2 g by mouth at bedtime.       . prochlorperazine (COMPAZINE) 10 MG tablet Take 1 tablet (10 mg total) by mouth every 6 (six) hours as needed (Nausea or vomiting).  30 tablet  1  . prochlorperazine (COMPAZINE) 25 MG suppository Place 1 suppository (25 mg total) rectally every 12 (twelve) hours as needed for nausea.  12 suppository  3   No current facility-administered medications for this visit.   Facility-Administered Medications Ordered in Other Visits  Medication Dose Route Frequency Provider Last Rate Last Dose  . lidocaine-prilocaine (EMLA) cream   Topical Once Pierce Crane, MD        Allergies:  Allergies  Allergen Reactions  . Contrast Media (Iodinated Diagnostic Agents) Shortness Of Breath and Rash  . Food Shortness Of Breath    Cantaloupe    Physical Exam: Filed Vitals:   02/09/12 1338  BP: 172/78  Pulse: 94    Temp: 99.3 F (37.4 C)    Body mass index is 27.26 kg/(m^2). Weight: 179 lbs. HEENT:  Sclerae anicteric, conjunctivae pink.  Oropharynx clear.  No mucositis or candidiasis.   Nodes:  No cervical, supraclavicular, or axillary lymphadenopathy palpated.  Breast Exam:  Deferred Lungs:  Clear to auscultation bilaterally.  No crackles, rhonchi, or wheezes.   Heart:  Regular rate and rhythm.   Abdomen:  Soft, nontender.  Positive bowel sounds.  No organomegaly or masses palpated.   Musculoskeletal:  No focal spinal tenderness to palpation.  Extremities:  Benign.  No peripheral edema or cyanosis.   Skin:  Benign. Neuro:  Nonfocal, alert and oriented x 3.   Lab Results: Lab Results  Component Value Date   WBC 1.2* 02/09/2012   HGB 11.1* 02/09/2012   HCT 32.2* 02/09/2012   MCV 91.5 02/09/2012   PLT 137* 02/09/2012   NEUTROABS 0.4* 02/09/2012     Chemistry      Component Value Date/Time   NA 139 02/02/2012 1358   K 3.6 02/02/2012 1358   CL 104 02/02/2012 1358   CO2 29 02/02/2012 1358   BUN 13 02/02/2012 1358   CREATININE 0.62 02/02/2012 1358      Component Value Date/Time   CALCIUM 9.1 02/02/2012 1358   ALKPHOS 98 02/02/2012 1358   AST 27 02/02/2012 1358   ALT 50* 02/02/2012 1358   BILITOT 0.3 02/02/2012 1358      Lab Results  Component Value Date   LABCA2 36 12/29/2011   Echocardiogram: 12/15/11 Study Conclusions - Left ventricle: Systolic function was normal. The estimated ejection fraction was in the range of 55% to 60%. Wall motion was normal; there were no regional wall motion abnormalities. Doppler parameters are consistent with abnormal left ventricular relaxation (grade 1 diastolic dysfunction). - Aortic valve: There was no stenosis. Trivial regurgitation. - Mitral valve: Mild regurgitation. - Left atrium: The atrium was at the upper limits of normal in size. - Right ventricle: The cavity size was normal. Systolic function was normal. - Pulmonary arteries: No complete TR  doppler jet so unable to estimate PA systolic pressure. - Inferior vena cava: The vessel was normal in size; the respirophasic diameter changes were in the normal range (= 50%); findings are consistent with normal central venous pressure. Impressions: - Normal LV size and systolic function, EF 55-60%. Normal RV size and systolic function. No significant valvular dysfunction. 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 Right simple mastectomy was clear. She is currently day 7 cycle 3/4 planned adjuvant q3wk FEC. Neulasta  support on day 2. 2.  Profound afebrile neutropenia, D6 Neulasta, D2 Cipro 500 mg by mouth twice a day.   Case reviewed with Dr. Pierce Crane.   Plan:  [name] Riley will continue on Cipro 500 mg by mouth twice a day and follow neutropenic cautions through 02/12/2012.   I will see [name] Riley back in 2 weeks' time prior to day 1 cycle 4 of every 3 week FEC given in the adjuvant setting, though we will see her prior if the need should arise. This plan was reviewed with the patient, who voices understanding and agreement.  She knows to call with any changes or problems.    [name] Westerhold T, PA-C 01/12/12

## 2012-02-16 ENCOUNTER — Ambulatory Visit: Payer: Medicaid Other | Admitting: Physical Therapy

## 2012-02-23 ENCOUNTER — Ambulatory Visit: Payer: Medicaid Other

## 2012-02-23 ENCOUNTER — Other Ambulatory Visit: Payer: Medicaid Other | Admitting: Lab

## 2012-02-23 ENCOUNTER — Ambulatory Visit: Payer: Medicaid Other | Admitting: Physician Assistant

## 2012-02-24 ENCOUNTER — Ambulatory Visit: Payer: Medicaid Other

## 2012-02-27 ENCOUNTER — Telehealth: Payer: Self-pay | Admitting: Oncology

## 2012-02-27 ENCOUNTER — Encounter: Payer: Self-pay | Admitting: Physician Assistant

## 2012-02-27 ENCOUNTER — Ambulatory Visit (HOSPITAL_BASED_OUTPATIENT_CLINIC_OR_DEPARTMENT_OTHER): Payer: Medicaid Other

## 2012-02-27 ENCOUNTER — Ambulatory Visit (HOSPITAL_BASED_OUTPATIENT_CLINIC_OR_DEPARTMENT_OTHER): Payer: Medicaid Other | Admitting: Physician Assistant

## 2012-02-27 ENCOUNTER — Other Ambulatory Visit (HOSPITAL_BASED_OUTPATIENT_CLINIC_OR_DEPARTMENT_OTHER): Payer: Medicaid Other | Admitting: Lab

## 2012-02-27 VITALS — BP 160/99 | HR 96 | Temp 98.8°F | Ht 68.0 in | Wt 180.3 lb

## 2012-02-27 DIAGNOSIS — C50919 Malignant neoplasm of unspecified site of unspecified female breast: Secondary | ICD-10-CM

## 2012-02-27 DIAGNOSIS — Z5111 Encounter for antineoplastic chemotherapy: Secondary | ICD-10-CM

## 2012-02-27 LAB — COMPREHENSIVE METABOLIC PANEL
ALT: 22 U/L (ref 0–35)
AST: 21 U/L (ref 0–37)
Albumin: 4.4 g/dL (ref 3.5–5.2)
Alkaline Phosphatase: 101 U/L (ref 39–117)
BUN: 12 mg/dL (ref 6–23)
CO2: 27 mEq/L (ref 19–32)
Calcium: 9.5 mg/dL (ref 8.4–10.5)
Chloride: 103 mEq/L (ref 96–112)
Creatinine, Ser: 0.59 mg/dL (ref 0.50–1.10)
Glucose, Bld: 103 mg/dL — ABNORMAL HIGH (ref 70–99)
Potassium: 3.7 mEq/L (ref 3.5–5.3)
Sodium: 138 mEq/L (ref 135–145)
Total Bilirubin: 0.5 mg/dL (ref 0.3–1.2)
Total Protein: 6.5 g/dL (ref 6.0–8.3)

## 2012-02-27 LAB — CBC WITH DIFFERENTIAL/PLATELET
BASO%: 0.1 % (ref 0.0–2.0)
Basophils Absolute: 0 10*3/uL (ref 0.0–0.1)
EOS%: 0.4 % (ref 0.0–7.0)
Eosinophils Absolute: 0 10*3/uL (ref 0.0–0.5)
HCT: 33.9 % — ABNORMAL LOW (ref 34.8–46.6)
HGB: 11.7 g/dL (ref 11.6–15.9)
LYMPH%: 5.7 % — ABNORMAL LOW (ref 14.0–49.7)
MCH: 31.4 pg (ref 25.1–34.0)
MCHC: 34.5 g/dL (ref 31.5–36.0)
MCV: 90.9 fL (ref 79.5–101.0)
MONO#: 0.9 10*3/uL (ref 0.1–0.9)
MONO%: 9.1 % (ref 0.0–14.0)
NEUT#: 8.8 10*3/uL — ABNORMAL HIGH (ref 1.5–6.5)
NEUT%: 84.7 % — ABNORMAL HIGH (ref 38.4–76.8)
Platelets: 315 10*3/uL (ref 145–400)
RBC: 3.73 10*6/uL (ref 3.70–5.45)
RDW: 16 % — ABNORMAL HIGH (ref 11.2–14.5)
WBC: 10.3 10*3/uL (ref 3.9–10.3)
lymph#: 0.6 10*3/uL — ABNORMAL LOW (ref 0.9–3.3)

## 2012-02-27 LAB — LACTATE DEHYDROGENASE: LDH: 229 U/L (ref 94–250)

## 2012-02-27 MED ORDER — PALONOSETRON HCL INJECTION 0.25 MG/5ML
0.2500 mg | Freq: Once | INTRAVENOUS | Status: AC
  Administered 2012-02-27: 0.25 mg via INTRAVENOUS

## 2012-02-27 MED ORDER — LORAZEPAM 2 MG/ML IJ SOLN
0.5000 mg | Freq: Once | INTRAMUSCULAR | Status: AC
  Administered 2012-02-27: 0.5 mg via INTRAVENOUS

## 2012-02-27 MED ORDER — DEXAMETHASONE SODIUM PHOSPHATE 4 MG/ML IJ SOLN
12.0000 mg | Freq: Once | INTRAMUSCULAR | Status: AC
  Administered 2012-02-27: 12 mg via INTRAVENOUS

## 2012-02-27 MED ORDER — LORAZEPAM 0.5 MG PO TABS: 0.5000 mg | ORAL_TABLET | Freq: Four times a day (QID) | ORAL | Status: DC | PRN

## 2012-02-27 MED ORDER — SODIUM CHLORIDE 0.9 % IJ SOLN
10.0000 mL | INTRAMUSCULAR | Status: DC | PRN
  Administered 2012-02-27: 10 mL
  Filled 2012-02-27: qty 10

## 2012-02-27 MED ORDER — SODIUM CHLORIDE 0.9 % IV SOLN
150.0000 mg | Freq: Once | INTRAVENOUS | Status: AC
  Administered 2012-02-27: 150 mg via INTRAVENOUS
  Filled 2012-02-27: qty 5

## 2012-02-27 MED ORDER — HEPARIN SOD (PORK) LOCK FLUSH 100 UNIT/ML IV SOLN
500.0000 [IU] | Freq: Once | INTRAVENOUS | Status: AC | PRN
  Administered 2012-02-27: 500 [IU]
  Filled 2012-02-27: qty 5

## 2012-02-27 MED ORDER — SODIUM CHLORIDE 0.9 % IV SOLN
Freq: Once | INTRAVENOUS | Status: AC
  Administered 2012-02-27: 10:00:00 via INTRAVENOUS

## 2012-02-27 MED ORDER — EPIRUBICIN HCL CHEMO IV INJECTION 200 MG/100ML
100.0000 mg/m2 | Freq: Once | INTRAVENOUS | Status: AC
  Administered 2012-02-27: 198 mg via INTRAVENOUS
  Filled 2012-02-27: qty 99

## 2012-02-27 MED ORDER — FLUOROURACIL CHEMO INJECTION 2.5 GM/50ML
500.0000 mg/m2 | Freq: Once | INTRAVENOUS | Status: AC
  Administered 2012-02-27: 1000 mg via INTRAVENOUS
  Filled 2012-02-27: qty 20

## 2012-02-27 MED ORDER — SODIUM CHLORIDE 0.9 % IV SOLN
500.0000 mg/m2 | Freq: Once | INTRAVENOUS | Status: AC
  Administered 2012-02-27: 1000 mg via INTRAVENOUS
  Filled 2012-02-27: qty 50

## 2012-02-27 NOTE — Progress Notes (Signed)
Hematology and Oncology Follow Up Visit  [name] Riley [id number] 1951-07-01 61 y.o. 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 Right simple mastectomy was clear. She is currently day 1 cycle 4/4 planned adjuvant q3wk FEC. Neulasta support on day 2.  2. History of profound afebrile neutropenia.  3. History of tick bite of the left mid back, completion of doxycycline 100 mg by mouth twice a day x7 days.   Interim History:   [name] Riley is seen today with her daughter in accompaniment for followup prior to her fourth of 4 planned doses of every 3 week FEC given in the adjuvant setting. She is feeling quite well today, denying any fevers, chills, or night sweats. No shortness of breath or chest pain issues. She denies nausea, emesis, diarrhea or constipation. No long bone pain. No bleeding or bruising symptoms.  She knows that she'll be initiating Cipro for neutropenic precautions prior to her week followup. A detailed review of systems is otherwise noncontributory as noted below.  Review of Systems: Constitutional: Feels well. Eyes: uses glasses ENT: no complaints Cardiovascular: no chest pain or dyspnea on exertion Respiratory: no cough, shortness of breath, or wheezing Neurological: no TIA or stroke symptoms Dermatological: negative Gastrointestinal: no abdominal pain, change in bowel habits, or black or bloody stools Genito-Urinary: no dysuria, trouble voiding, or hematuria Hematological and Lymphatic: negative Breast: s/p B mastectomies. Musculoskeletal: negative Remaining ROS negative.   Medications:   I have reviewed the patient's  current medications.  Current Outpatient Prescriptions  Medication Sig Dispense Refill  . aspirin 81 MG tablet Take 81 mg by mouth daily.        Marland Kitchen dexamethasone (DECADRON) 4 MG tablet Take 2 tablets by mouth once a day on the day after chemotherapy and then take 2 tablets two times a day for 2 days. Take with food.  30 tablet  1  . fish oil-omega-3 fatty acids 1000 MG capsule Take 2 g by mouth at bedtime.       Marland Kitchen levothyroxine (SYNTHROID, LEVOTHROID) 75 MCG tablet Take 75 mcg by mouth daily.       Marland Kitchen lidocaine-prilocaine (EMLA) cream Apply topically as needed. APPLY TO PAC 1-2 HOURS PRIOR TO ACCESS  30 g  1  . LORazepam (ATIVAN) 0.5 MG tablet Take 1 tablet (0.5 mg total) by mouth every 6 (six) hours as needed (Nausea or vomiting).  30 tablet  0  . metoprolol (TOPROL-XL) 100 MG 24 hr tablet Take 100 mg by mouth daily.       . Multiple Vitamins-Minerals (MULTIVITAMIN WITH MINERALS) tablet Take 1 tablet by mouth daily.        . ondansetron (ZOFRAN) 8 MG tablet Take 1 tablet two times a day as needed for nausea or vomiting starting on the third day after chemotherapy.  30 tablet  1  . prochlorperazine (COMPAZINE) 10 MG tablet Take 1 tablet (10 mg total) by mouth every 6 (six) hours as needed (Nausea or vomiting).  30 tablet  1  . prochlorperazine (COMPAZINE) 25 MG suppository Place 1 suppository (25 mg total) rectally every 12 (twelve) hours as needed for nausea.  12 suppository  3   No current  facility-administered medications for this visit.   Facility-Administered Medications Ordered in Other Visits  Medication Dose Route Frequency Provider Last Rate Last Dose  . lidocaine-prilocaine (EMLA) cream   Topical Once Pierce Crane, MD        Allergies:  Allergies  Allergen Reactions  . Contrast Media (Iodinated Diagnostic Agents) Shortness Of Breath and Rash  . Food Shortness Of Breath    Cantaloupe    Physical Exam: Filed Vitals:   02/27/12 0851  BP: 160/99  Pulse: 96  Temp: 98.8 F (37.1  C)    Body mass index is 27.41 kg/(m^2). Weight: 180 lbs. HEENT:  Sclerae anicteric, conjunctivae pink.  Oropharynx clear.  No mucositis or candidiasis.   Nodes:  No cervical, supraclavicular, or axillary lymphadenopathy palpated.  Breast Exam:  Deferred Lungs:  Clear to auscultation bilaterally.  No crackles, rhonchi, or wheezes.   Heart:  Regular rate and rhythm.   Abdomen:  Soft, nontender.  Positive bowel sounds.  No organomegaly or masses palpated.   Musculoskeletal:  No focal spinal tenderness to palpation.  Extremities:  Benign.  No peripheral edema or cyanosis, no evidence of lymphedema of the left upper extremity at this time. Skin:  Benign.   Neuro:  Nonfocal, alert and oriented x 3.   Lab Results: Lab Results  Component Value Date   WBC 10.3 02/27/2012   HGB 11.7 02/27/2012   HCT 33.9* 02/27/2012   MCV 90.9 02/27/2012   PLT 315 02/27/2012   NEUTROABS 8.8* 02/27/2012     Chemistry      Component Value Date/Time   NA 139 02/02/2012 1358   K 3.6 02/02/2012 1358   CL 104 02/02/2012 1358   CO2 29 02/02/2012 1358   BUN 13 02/02/2012 1358   CREATININE 0.62 02/02/2012 1358      Component Value Date/Time   CALCIUM 9.1 02/02/2012 1358   ALKPHOS 98 02/02/2012 1358   AST 27 02/02/2012 1358   ALT 50* 02/02/2012 1358   BILITOT 0.3 02/02/2012 1358      Lab Results  Component Value Date   LABCA2 36 12/29/2011   Echocardiogram: 12/15/11 Study Conclusions - Left ventricle: Systolic function was normal. The estimated ejection fraction was in the range of 55% to 60%. Wall motion was normal; there were no regional wall motion abnormalities. Doppler parameters are consistent with abnormal left ventricular relaxation (grade 1 diastolic dysfunction). - Aortic valve: There was no stenosis. Trivial regurgitation. - Mitral valve: Mild regurgitation. - Left atrium: The atrium was at the upper limits of normal in size. - Right ventricle: The cavity size was normal. Systolic function was  normal. - Pulmonary arteries: No complete TR doppler jet so unable to estimate PA systolic pressure. - Inferior vena cava: The vessel was normal in size; the respirophasic diameter changes were in the normal range (= 50%); findings are consistent with normal central venous pressure. Impressions: - Normal LV size and systolic function, EF 55-60%. Normal RV size and systolic function. No significant valvular dysfunction. 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 Right simple mastectomy was clear. She is currently day 1 cycle 4/4 planned adjuvant  q3wk FEC. Neulasta support on day 2.  2. History of profound afebrile neutropenia.  3. History of tick bite left mid back, completion of seven-day course of doxycycline 100 mg by mouth twice a day.  Case to be reviewed with Dr. Pierce Crane.   Plan:  [name] Riley will receive day 1 cycle 4 of adjuvant every 3 week FEC to day schedule. She will then present on day 2 for Neulasta support. She will initiate Cipro 500 mg by mouth twice a day for neutropenic precautions on 03/02/2012, I will see her officially in 1 week's time for nadir assessment. This plan was reviewed with the patient, who voices understanding and agreement.  She knows to call with any changes or problems.    Danyal Whitenack T, PA-C 02/27/12

## 2012-02-27 NOTE — Telephone Encounter (Signed)
gve the pt her may,june 2013 appt calendar. The pt is aware her chemo appts will be added. Sent michelle a staff message to add the chemo appts

## 2012-02-27 NOTE — Patient Instructions (Signed)
[facility] Discharge Instructions for Patients Receiving Chemotherapy  Today you received the following chemotherapy agents Ellence, 5FU and Cytoxan  To help prevent nausea and vomiting after your treatment, we encourage you to take your nausea medication as prescribed.   If you develop nausea and vomiting that is not controlled by your nausea medication, call the clinic. If it is after clinic hours your family physician or the after hours number for the clinic or go to the Emergency Department.   BELOW ARE SYMPTOMS THAT SHOULD BE REPORTED IMMEDIATELY:  *FEVER GREATER THAN 100.5 F  *CHILLS WITH OR WITHOUT FEVER  NAUSEA AND VOMITING THAT IS NOT CONTROLLED WITH YOUR NAUSEA MEDICATION  *UNUSUAL SHORTNESS OF BREATH  *UNUSUAL BRUISING OR BLEEDING  TENDERNESS IN MOUTH AND THROAT WITH OR WITHOUT PRESENCE OF ULCERS  *URINARY PROBLEMS  *BOWEL PROBLEMS  UNUSUAL RASH Items with * indicate a potential emergency and should be followed up as soon as possible.  One of the nurses will contact you 24 hours after your treatment. Please let the nurse know about any problems that you may have experienced. Feel free to call the clinic you have any questions or concerns. The clinic phone number is [phone].   I have been informed and understand all the instructions given to me. I know to contact the clinic, my physician, or go to the Emergency Department if any problems should occur. I do not have any questions at this time, but understand that I may call the clinic during office hours   should I have any questions or need assistance in obtaining follow up care.    __________________________________________  _____________  __________ Signature of Patient or Authorized Representative            Date                   Time    __________________________________________ Nurse's Signature

## 2012-02-28 ENCOUNTER — Ambulatory Visit (HOSPITAL_BASED_OUTPATIENT_CLINIC_OR_DEPARTMENT_OTHER): Payer: Medicaid Other

## 2012-02-28 ENCOUNTER — Other Ambulatory Visit: Payer: Self-pay | Admitting: *Deleted

## 2012-02-28 VITALS — BP 130/69 | HR 84 | Temp 98.0°F

## 2012-02-28 DIAGNOSIS — C50912 Malignant neoplasm of unspecified site of left female breast: Secondary | ICD-10-CM

## 2012-02-28 DIAGNOSIS — C50919 Malignant neoplasm of unspecified site of unspecified female breast: Secondary | ICD-10-CM

## 2012-02-28 MED ORDER — DEXAMETHASONE 4 MG PO TABS: ORAL_TABLET | ORAL | Status: DC

## 2012-02-28 MED ORDER — PEGFILGRASTIM INJECTION 6 MG/0.6ML
6.0000 mg | Freq: Once | SUBCUTANEOUS | Status: AC
  Administered 2012-02-28: 6 mg via SUBCUTANEOUS
  Filled 2012-02-28: qty 0.6

## 2012-03-01 ENCOUNTER — Other Ambulatory Visit: Payer: Medicaid Other | Admitting: Lab

## 2012-03-01 ENCOUNTER — Ambulatory Visit: Payer: Medicaid Other | Admitting: Physician Assistant

## 2012-03-02 ENCOUNTER — Other Ambulatory Visit: Payer: Self-pay | Admitting: *Deleted

## 2012-03-02 DIAGNOSIS — C50919 Malignant neoplasm of unspecified site of unspecified female breast: Secondary | ICD-10-CM

## 2012-03-02 DIAGNOSIS — C50419 Malignant neoplasm of upper-outer quadrant of unspecified female breast: Secondary | ICD-10-CM

## 2012-03-05 ENCOUNTER — Encounter: Payer: Self-pay | Admitting: Physician Assistant

## 2012-03-05 ENCOUNTER — Ambulatory Visit (HOSPITAL_BASED_OUTPATIENT_CLINIC_OR_DEPARTMENT_OTHER): Payer: Medicaid Other | Admitting: Physician Assistant

## 2012-03-05 ENCOUNTER — Other Ambulatory Visit (HOSPITAL_BASED_OUTPATIENT_CLINIC_OR_DEPARTMENT_OTHER): Payer: Medicaid Other | Admitting: Lab

## 2012-03-05 VITALS — BP 146/85 | HR 112 | Temp 98.0°F | Ht 68.0 in | Wt 176.7 lb

## 2012-03-05 DIAGNOSIS — C50419 Malignant neoplasm of upper-outer quadrant of unspecified female breast: Secondary | ICD-10-CM

## 2012-03-05 DIAGNOSIS — C50919 Malignant neoplasm of unspecified site of unspecified female breast: Secondary | ICD-10-CM

## 2012-03-05 LAB — COMPREHENSIVE METABOLIC PANEL WITH GFR
ALT: 14 U/L (ref 0–35)
AST: 8 U/L (ref 0–37)
Albumin: 3.9 g/dL (ref 3.5–5.2)
Alkaline Phosphatase: 94 U/L (ref 39–117)
BUN: 19 mg/dL (ref 6–23)
CO2: 23 meq/L (ref 19–32)
Calcium: 9 mg/dL (ref 8.4–10.5)
Chloride: 104 meq/L (ref 96–112)
Creatinine, Ser: 0.59 mg/dL (ref 0.50–1.10)
Glucose, Bld: 114 mg/dL — ABNORMAL HIGH (ref 70–99)
Potassium: 3.7 meq/L (ref 3.5–5.3)
Sodium: 137 meq/L (ref 135–145)
Total Bilirubin: 0.7 mg/dL (ref 0.3–1.2)
Total Protein: 5.9 g/dL — ABNORMAL LOW (ref 6.0–8.3)

## 2012-03-05 LAB — CBC WITH DIFFERENTIAL/PLATELET
BASO%: 0 % (ref 0.0–2.0)
Basophils Absolute: 0 10e3/uL (ref 0.0–0.1)
EOS%: 29.9 % — ABNORMAL HIGH (ref 0.0–7.0)
Eosinophils Absolute: 0.2 10e3/uL (ref 0.0–0.5)
HCT: 33.1 % — ABNORMAL LOW (ref 34.8–46.6)
HGB: 11.4 g/dL — ABNORMAL LOW (ref 11.6–15.9)
LYMPH%: 51.9 % — ABNORMAL HIGH (ref 14.0–49.7)
MCH: 31.6 pg (ref 25.1–34.0)
MCHC: 34.4 g/dL (ref 31.5–36.0)
MCV: 91.7 fL (ref 79.5–101.0)
MONO#: 0 10e3/uL — ABNORMAL LOW (ref 0.1–0.9)
MONO%: 1.3 % (ref 0.0–14.0)
NEUT#: 0.1 10e3/uL — CL (ref 1.5–6.5)
NEUT%: 16.9 % — ABNORMAL LOW (ref 38.4–76.8)
Platelets: 157 10e3/uL (ref 145–400)
RBC: 3.61 10e6/uL — ABNORMAL LOW (ref 3.70–5.45)
RDW: 15.3 % — ABNORMAL HIGH (ref 11.2–14.5)
WBC: 0.8 10e3/uL — CL (ref 3.9–10.3)
lymph#: 0.4 10e3/uL — ABNORMAL LOW (ref 0.9–3.3)
nRBC: 0 % (ref 0–0)

## 2012-03-05 NOTE — Patient Instructions (Signed)
1. Decadron 4mg  tablets:                                        -starting 03/21/12, take 2 in AM, take 2 in PM                                        -day of chemo, NO AM dose, but do take 8mg  (2-4mg  tablets) in PM                                        -03/23/12, take 2 in AM, 2 in PM-then STOP  2. Still get Neulasta on day 2  3. May use Zofran 8mg  twice a day starting the day after chemo, with Compazine for backup.

## 2012-03-05 NOTE — Progress Notes (Signed)
Hematology and Oncology Follow Up Visit  [name] Riley [id number] [birth year] 61 y.o. 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 Right simple mastectomy was clear. She is currently day 7 cycle 4/4 planned adjuvant q3wk FEC. Neulasta support on day 2. 2. History of profound afebrile neutropenia, currently day 6 Neulasta and D2 Cipro 500 mg by mouth twice a day.  Interim History:   [name] Riley is seen today unaccompanimed for followup after her fourth of 4 planned doses of every 3 week FEC given in the adjuvant setting. She is feeling more fatigued, denying any fevers, chills, or night sweats. No shortness of breath or chest pain issues. She denies nausea, emesis, diarrhea or constipation. No long bone pain. No bleeding or bruising symptoms. She is on Cipro 500 mg by mouth twice a day.  A detailed review of systems is otherwise noncontributory as noted below.  Review of Systems: Constitutional:  Fatigued, but otherwise feels well. Eyes: uses glasses ENT: no complaints Cardiovascular: no chest pain or dyspnea on exertion Respiratory: no cough, shortness of breath, or wheezing Neurological: no TIA or stroke symptoms Dermatological: negative Gastrointestinal: no abdominal pain, change in bowel habits, or black or bloody stools Genito-Urinary: no dysuria, trouble voiding, or hematuria Hematological and Lymphatic: negative Breast: s/p B mastectomies. Musculoskeletal: negative Remaining ROS negative.   Medications:   I have reviewed the patient's current medications.  Current Outpatient Prescriptions  Medication Sig Dispense Refill  . aspirin 81 MG  tablet Take 81 mg by mouth daily.        Marland Kitchen dexamethasone (DECADRON) 4 MG tablet Take 2 tablets by mouth once a day on the day after chemotherapy and then take 2 tablets two times a day for 2 days. Take with food.  30 tablet  1  . fish oil-omega-3 fatty acids 1000 MG capsule Take 2 g by mouth at bedtime.       Marland Kitchen levothyroxine (SYNTHROID, LEVOTHROID) 75 MCG tablet Take 75 mcg by mouth daily.       Marland Kitchen lidocaine-prilocaine (EMLA) cream Apply topically as needed. APPLY TO PAC 1-2 HOURS PRIOR TO ACCESS  30 g  1  . LORazepam (ATIVAN) 0.5 MG tablet Take 1 tablet (0.5 mg total) by mouth every 6 (six) hours as needed (Nausea or vomiting).  90 tablet  0  . metoprolol (TOPROL-XL) 100 MG 24 hr tablet Take 100 mg by mouth daily.       . Multiple Vitamins-Minerals (MULTIVITAMIN WITH MINERALS) tablet Take 1 tablet by mouth daily.        . ondansetron (ZOFRAN) 8 MG tablet Take 1 tablet two times a day as needed for nausea or vomiting starting on the third day after chemotherapy.  30 tablet  1  . prochlorperazine (COMPAZINE) 10 MG tablet Take 1 tablet (10 mg total) by mouth every 6 (six) hours as needed (Nausea or vomiting).  30 tablet  1  . prochlorperazine (COMPAZINE) 25 MG suppository Place 1 suppository (25 mg total) rectally every 12 (twelve) hours as needed for nausea.  12 suppository  3   No current facility-administered medications for this visit.   Facility-Administered Medications Ordered in Other Visits  Medication Dose Route  Frequency Provider Last Rate Last Dose  . lidocaine-prilocaine (EMLA) cream   Topical Once Pierce Crane, MD        Allergies:  Allergies  Allergen Reactions  . Contrast Media (Iodinated Diagnostic Agents) Shortness Of Breath and Rash  . Food Shortness Of Breath    Cantaloupe    Physical Exam: Filed Vitals:   03/05/12 0854  BP: 146/85  Pulse: 112  Temp: 98 F (36.7 C)    Body mass index is 26.87 kg/(m^2). Weight: 176 lbs. HEENT:  Sclerae anicteric, conjunctivae pink.   Oropharynx clear.  No mucositis or candidiasis.   Nodes:  No cervical, supraclavicular, or axillary lymphadenopathy palpated.  Breast Exam:  Deferred Lungs:  Clear to auscultation bilaterally.  No crackles, rhonchi, or wheezes.   Heart:  Regular rate and rhythm.   Abdomen:  Soft, nontender.  Positive bowel sounds.  No organomegaly or masses palpated.   Musculoskeletal:  No focal spinal tenderness to palpation.  Extremities:  Benign.  No peripheral edema or cyanosis.   Skin:  Benign. Neuro:  Nonfocal, alert and oriented x 3.   Lab Results: Lab Results  Component Value Date   WBC 0.8* 03/05/2012   HGB 11.4* 03/05/2012   HCT 33.1* 03/05/2012   MCV 91.7 03/05/2012   PLT 157 03/05/2012   NEUTROABS 0.1* 03/05/2012     Chemistry      Component Value Date/Time   NA 138 02/27/2012 0912   K 3.7 02/27/2012 0912   CL 103 02/27/2012 0912   CO2 27 02/27/2012 0912   BUN 12 02/27/2012 0912   CREATININE 0.59 02/27/2012 0912      Component Value Date/Time   CALCIUM 9.5 02/27/2012 0912   ALKPHOS 101 02/27/2012 0912   AST 21 02/27/2012 0912   ALT 22 02/27/2012 0912   BILITOT 0.5 02/27/2012 0912      Lab Results  Component Value Date   LABCA2 36 12/29/2011   Echocardiogram: 12/15/11 Study Conclusions - Left ventricle: Systolic function was normal. The estimated ejection fraction was in the range of 55% to 60%. Wall motion was normal; there were no regional wall motion abnormalities. Doppler parameters are consistent with abnormal left ventricular relaxation (grade 1 diastolic dysfunction). - Aortic valve: There was no stenosis. Trivial regurgitation. - Mitral valve: Mild regurgitation. - Left atrium: The atrium was at the upper limits of normal in size. - Right ventricle: The cavity size was normal. Systolic function was normal. - Pulmonary arteries: No complete TR doppler jet so unable to estimate PA systolic pressure. - Inferior vena cava: The vessel was normal in size; the respirophasic  diameter changes were in the normal range (= 50%); findings are consistent with normal central venous pressure. Impressions: - Normal LV size and systolic function, EF 55-60%. Normal RV size and systolic function. No significant valvular dysfunction. 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 Right simple mastectomy was clear. She is currently day 7 cycle 4/4 planned adjuvant q3wk FEC. Neulasta support on day 2. 2.  Profound afebrile neutropenia, D6 Neulasta, D2 Cipro 500 mg by mouth twice a day.   Case to be reviewed  with Dr. Pierce Crane.   Plan:  Jadia will continue on Cipro 500 mg by mouth twice a day and follow neutropenic cautions through 03/09/2012.   I will see [name] Riley back in 2 weeks' time prior to day 1 cycle 1/4 of every 3 week Taxotere given in the adjuvant setting, side effect profile has been discussed and appropriate Decadron schedule reviewed. though we will see her prior if the need should arise. This plan was reviewed with the patient, who voices understanding and agreement.  She knows to call with any changes or problems.    Dezaree Tracey T, PA-C 03/05/12

## 2012-03-06 ENCOUNTER — Encounter: Payer: Self-pay | Admitting: Gastroenterology

## 2012-03-14 NOTE — Progress Notes (Signed)
Encounter addended by: Agnes Lawrence, RN on: 03/14/2012  1:39 PM<BR>     Documentation filed: Charges VN

## 2012-03-20 ENCOUNTER — Encounter: Payer: Self-pay | Admitting: *Deleted

## 2012-03-20 NOTE — Progress Notes (Signed)
PT REQUESTS A CHANGE IN CHEMO TX SCHEDULE. PER CS, OK TO RESCHEDULE PT A WEEK LATER FOR 1ST TIME TAXOTERE. WITH LAB AND AN INJECTION. SCHEDULING HAS BEEN NOTIFIED

## 2012-03-21 ENCOUNTER — Telehealth: Payer: Self-pay | Admitting: *Deleted

## 2012-03-21 NOTE — Telephone Encounter (Signed)
sent michelle from email to cancelled chemo on 03-22-2012 and injection on 03-23-2012 moved chemo on 03-29-2012 and injection on 03-30-2012

## 2012-03-22 ENCOUNTER — Other Ambulatory Visit: Payer: Medicaid Other | Admitting: Lab

## 2012-03-22 ENCOUNTER — Ambulatory Visit: Payer: Medicaid Other

## 2012-03-22 ENCOUNTER — Ambulatory Visit: Payer: Medicaid Other | Admitting: Physician Assistant

## 2012-03-22 ENCOUNTER — Other Ambulatory Visit: Payer: Self-pay | Admitting: *Deleted

## 2012-03-23 ENCOUNTER — Ambulatory Visit: Payer: Medicaid Other

## 2012-03-28 ENCOUNTER — Other Ambulatory Visit: Payer: Self-pay | Admitting: Physician Assistant

## 2012-03-28 DIAGNOSIS — C50912 Malignant neoplasm of unspecified site of left female breast: Secondary | ICD-10-CM

## 2012-03-29 ENCOUNTER — Other Ambulatory Visit (HOSPITAL_BASED_OUTPATIENT_CLINIC_OR_DEPARTMENT_OTHER): Payer: Medicaid Other | Admitting: Lab

## 2012-03-29 ENCOUNTER — Ambulatory Visit (HOSPITAL_BASED_OUTPATIENT_CLINIC_OR_DEPARTMENT_OTHER): Payer: Medicaid Other | Admitting: Physician Assistant

## 2012-03-29 ENCOUNTER — Telehealth: Payer: Self-pay | Admitting: Oncology

## 2012-03-29 VITALS — BP 159/92 | HR 105 | Temp 98.4°F | Ht 68.0 in | Wt 175.3 lb

## 2012-03-29 DIAGNOSIS — C50919 Malignant neoplasm of unspecified site of unspecified female breast: Secondary | ICD-10-CM

## 2012-03-29 DIAGNOSIS — C50912 Malignant neoplasm of unspecified site of left female breast: Secondary | ICD-10-CM

## 2012-03-29 LAB — CBC WITH DIFFERENTIAL/PLATELET
BASO%: 0.2 % (ref 0.0–2.0)
Basophils Absolute: 0 10*3/uL (ref 0.0–0.1)
EOS%: 0 % (ref 0.0–7.0)
Eosinophils Absolute: 0 10*3/uL (ref 0.0–0.5)
HCT: 34.4 % — ABNORMAL LOW (ref 34.8–46.6)
HGB: 11.6 g/dL (ref 11.6–15.9)
LYMPH%: 9.2 % — ABNORMAL LOW (ref 14.0–49.7)
MCH: 32.4 pg (ref 25.1–34.0)
MCHC: 33.8 g/dL (ref 31.5–36.0)
MCV: 95.8 fL (ref 79.5–101.0)
MONO#: 0 10*3/uL — ABNORMAL LOW (ref 0.1–0.9)
MONO%: 0.7 % (ref 0.0–14.0)
NEUT#: 4.7 10*3/uL (ref 1.5–6.5)
NEUT%: 89.9 % — ABNORMAL HIGH (ref 38.4–76.8)
Platelets: 345 10*3/uL (ref 145–400)
RBC: 3.59 10*6/uL — ABNORMAL LOW (ref 3.70–5.45)
RDW: 16 % — ABNORMAL HIGH (ref 11.2–14.5)
WBC: 5.2 10*3/uL (ref 3.9–10.3)
lymph#: 0.5 10*3/uL — ABNORMAL LOW (ref 0.9–3.3)

## 2012-03-29 LAB — COMPREHENSIVE METABOLIC PANEL
ALT: 37 U/L — ABNORMAL HIGH (ref 0–35)
AST: 36 U/L (ref 0–37)
Albumin: 4.6 g/dL (ref 3.5–5.2)
Alkaline Phosphatase: 109 U/L (ref 39–117)
BUN: 10 mg/dL (ref 6–23)
CO2: 24 mEq/L (ref 19–32)
Calcium: 10.1 mg/dL (ref 8.4–10.5)
Chloride: 103 mEq/L (ref 96–112)
Creatinine, Ser: 0.67 mg/dL (ref 0.50–1.10)
Glucose, Bld: 183 mg/dL — ABNORMAL HIGH (ref 70–99)
Potassium: 3.7 mEq/L (ref 3.5–5.3)
Sodium: 138 mEq/L (ref 135–145)
Total Bilirubin: 0.5 mg/dL (ref 0.3–1.2)
Total Protein: 7.5 g/dL (ref 6.0–8.3)

## 2012-03-29 LAB — LACTATE DEHYDROGENASE: LDH: 240 U/L (ref 94–250)

## 2012-03-29 NOTE — Patient Instructions (Signed)
1. You understand the dexamethasone schedule.  2. Pertaining to Zofran (ondansetron) 8mg  tabs-you will start taking the day AFTER chemo ( 03/31/12) 1 in the AM, 1 in the PM for 3 days AFTER chemo (6/15, 6/16, and 04/02/12) then STOP  3. Pertaining to Compazine (prochlorperazine) 10mg , you may take 1 every 6-8hr IF needed (at ANYTIME) for nausea

## 2012-03-29 NOTE — Progress Notes (Signed)
Hematology and Oncology Follow Up Visit  [name] Riley [id number] Aug 11, 1951 61 y.o. 03/29/2012    HPI: [name] Riley is a 61 year-old [place] woman with a history of a T2 N1a infiltrating ductal carcinoma of the left breast for which she underwent a left modified radical mastectomy with axillary node dissection which revealed 3/18 nodes with metastatic disease and evidence of extracapsular extension, ER/PR positive at 89/81% respectively, Ki-67 at 79%, HER-2 negative. Right simple mastectomy was clear. She has completed 4/4 cycles of every 3 week FEC, now due to initiate day 1 cycle 1/4 planned adjuvant q3wk Taxotere with Neulasta support on day 2, on 03/30/12.  2. History of profound afebrile neutropenia.  3. History of tick bite left mid back, completion of seven-day course of doxycycline 100 mg by mouth twice a day.    Interim History:   [name] Riley is seen today unaccompanied for followup prior to her first of 4 planned doses of every 3 week Taxotere given in the adjuvant setting, on 03/30/12.  She has started her dexamethasone pre-meds per Taxotere protocol. She is feeling quite well today, denying any fevers, chills, or night sweats. No shortness of breath or chest pain issues. She denies nausea, emesis, diarrhea or constipation. No long bone pain. No bleeding or bruising symptoms.  She does not have any baseline neuropathy issues, thouigh her left great toenail is lifting.  She denies any evidence of discharge. A detailed review of systems is otherwise noncontributory as noted below.  Review of Systems: Constitutional: Feels well. Eyes: uses glasses ENT: no complaints Cardiovascular: no chest pain or dyspnea on exertion Respiratory: no cough, shortness of breath, or wheezing Neurological: no TIA or stroke symptoms Dermatological: negative Gastrointestinal: no abdominal pain, change in bowel habits, or black or bloody stools Genito-Urinary: no dysuria, trouble voiding, or  hematuria Hematological and Lymphatic: negative Breast: s/p B mastectomies. Musculoskeletal: negative Remaining ROS negative.   Medications:   I have reviewed the patient's current medications.  Current Outpatient Prescriptions  Medication Sig Dispense Refill  . aspirin 81 MG tablet Take 81 mg by mouth daily.        Marland Kitchen dexamethasone (DECADRON) 4 MG tablet Take 2 tablets by mouth once a day on the day after chemotherapy and then take 2 tablets two times a day for 2 days. Take with food.  30 tablet  1  . fish oil-omega-3 fatty acids 1000 MG capsule Take 2 g by mouth at bedtime.       Marland Kitchen levothyroxine (SYNTHROID, LEVOTHROID) 75 MCG tablet Take 75 mcg by mouth daily.       Marland Kitchen lidocaine-prilocaine (EMLA) cream Apply topically as needed. APPLY TO PAC 1-2 HOURS PRIOR TO ACCESS  30 g  1  . LORazepam (ATIVAN) 0.5 MG tablet Take 1 tablet (0.5 mg total) by mouth every 6 (six) hours as needed (Nausea or vomiting).  90 tablet  0  . metoprolol (TOPROL-XL) 100 MG 24 hr tablet Take 100 mg by mouth daily.       . Multiple Vitamins-Minerals (MULTIVITAMIN WITH MINERALS) tablet Take 1 tablet by mouth daily.        . ondansetron (ZOFRAN) 8 MG tablet Take 1 tablet two times a day as needed for nausea or vomiting starting on the third day after chemotherapy.  30 tablet  1  . prochlorperazine (COMPAZINE) 10 MG tablet Take 1 tablet (10 mg total) by mouth every 6 (six) hours as needed (Nausea or vomiting).  30 tablet  1  .  prochlorperazine (COMPAZINE) 25 MG suppository Place 1 suppository (25 mg total) rectally every 12 (twelve) hours as needed for nausea.  12 suppository  3   No current facility-administered medications for this visit.   Facility-Administered Medications Ordered in Other Visits  Medication Dose Route Frequency Provider Last Rate Last Dose  . lidocaine-prilocaine (EMLA) cream   Topical Once Pierce Crane, MD        Allergies:  Allergies  Allergen Reactions  . Contrast Media (Iodinated Diagnostic  Agents) Shortness Of Breath and Rash  . Food Shortness Of Breath    Cantaloupe    Physical Exam: Filed Vitals:   03/29/12 1343  BP: 159/92  Pulse: 105  Temp: 98.4 F (36.9 C)    Body mass index is 26.65 kg/(m^2). Weight: 175 lbs. HEENT:  Sclerae anicteric, conjunctivae pink.  Oropharynx clear.  No mucositis or candidiasis.   Nodes:  No cervical, supraclavicular, or axillary lymphadenopathy palpated.  Breast Exam:  Deferred Lungs:  Clear to auscultation bilaterally.  No crackles, rhonchi, or wheezes.   Heart:  Regular rate and rhythm.   Abdomen:  Soft, nontender.  Positive bowel sounds.  No organomegaly or masses palpated.   Musculoskeletal:  No focal spinal tenderness to palpation.  Extremities:  Benign.  No peripheral edema or cyanosis, no evidence of lymphedema of the left upper extremity at this time.  Her left great toenail does show evidence of change, no exudate noted. Skin:  Benign, except she does have some evidence of flushing/rash on her upper back area anterior chest. No folliculitis component. No evidence of excoriation. Neuro:  Nonfocal, alert and oriented x 3.   Lab Results: Lab Results  Component Value Date   WBC 5.2 03/29/2012   HGB 11.6 03/29/2012   HCT 34.4* 03/29/2012   MCV 95.8 03/29/2012   PLT 345 03/29/2012   NEUTROABS 4.7 03/29/2012     Chemistry      Component Value Date/Time   NA 137 03/05/2012 0816   K 3.7 03/05/2012 0816   CL 104 03/05/2012 0816   CO2 23 03/05/2012 0816   BUN 19 03/05/2012 0816   CREATININE 0.59 03/05/2012 0816      Component Value Date/Time   CALCIUM 9.0 03/05/2012 0816   ALKPHOS 94 03/05/2012 0816   AST 8 03/05/2012 0816   ALT 14 03/05/2012 0816   BILITOT 0.7 03/05/2012 0816      Lab Results  Component Value Date   LABCA2 36 12/29/2011   Echocardiogram: 12/15/11 Study Conclusions - Left ventricle: Systolic function was normal. The estimated ejection fraction was in the range of 55% to 60%. Wall motion was normal; there were no  regional wall motion abnormalities. Doppler parameters are consistent with abnormal left ventricular relaxation (grade 1 diastolic dysfunction). - Aortic valve: There was no stenosis. Trivial regurgitation. - Mitral valve: Mild regurgitation. - Left atrium: The atrium was at the upper limits of normal in size. - Right ventricle: The cavity size was normal. Systolic function was normal. - Pulmonary arteries: No complete TR doppler jet so unable to estimate PA systolic pressure. - Inferior vena cava: The vessel was normal in size; the respirophasic diameter changes were in the normal range (= 50%); findings are consistent with normal central venous pressure. Impressions: - Normal LV size and systolic function, EF 55-60%. Normal RV size and systolic function. No significant valvular dysfunction. Prepared and Electronically Authenticated by Marca Ancona, MD     Assessment:  [name] Riley is a 61 year-old [facility] woman with  a history of a T2 N1a infiltrating ductal carcinoma of the left breast for which she underwent a left modified radical mastectomy with axillary node dissection which revealed 3/18 nodes with metastatic disease and evidence of extracapsular extension, ER/PR positive at 89/81% respectively, Ki-67 at 79%, HER-2 negative. Right simple mastectomy was clear. She has completed 4/4 cycles of every 3 week FEC, now due to initiate day 1 cycle 1/4 planned adjuvant q3wk Taxotere with Neulasta support on day 2, on 03/30/12.  2. History of profound afebrile neutropenia.  3. History of tick bite left mid back, completion of seven-day course of doxycycline 100 mg by mouth twice a day.  4. Left great toenail changes.  Case reviewed with Dr. Pierce Crane.   Plan:  [name] Riley will receive day 1 cycle 1/4 of adjuvant every 3 week Taxotere on 03/30/12 as scheduled. She will then present on day 2 for Neulasta support. 3, I will see her officially in 1 week's time for nadir  assessment.  She may use vinegar soaks, but to notifiy [name] of any significant changes to her toenail. This plan was reviewed with the patient, who voices understanding and agreement.  She knows to call with any changes or problems.  Runette Scifres T, PA-C 03/29/12

## 2012-03-29 NOTE — Telephone Encounter (Signed)
gve the pt her June,july 2013 appt calendar. Pt is aware that her chemo will be added. Sent michelle a staff message

## 2012-03-30 ENCOUNTER — Other Ambulatory Visit: Payer: Self-pay | Admitting: Physician Assistant

## 2012-03-30 ENCOUNTER — Ambulatory Visit: Payer: Medicaid Other

## 2012-03-30 ENCOUNTER — Ambulatory Visit (HOSPITAL_BASED_OUTPATIENT_CLINIC_OR_DEPARTMENT_OTHER): Payer: Medicaid Other

## 2012-03-30 VITALS — BP 146/86 | HR 70 | Temp 98.5°F

## 2012-03-30 DIAGNOSIS — C50919 Malignant neoplasm of unspecified site of unspecified female breast: Secondary | ICD-10-CM

## 2012-03-30 DIAGNOSIS — C50912 Malignant neoplasm of unspecified site of left female breast: Secondary | ICD-10-CM

## 2012-03-30 DIAGNOSIS — Z5111 Encounter for antineoplastic chemotherapy: Secondary | ICD-10-CM

## 2012-03-30 MED ORDER — DOCETAXEL CHEMO INJECTION 160 MG/16ML
75.0000 mg/m2 | Freq: Once | INTRAVENOUS | Status: AC
  Administered 2012-03-30: 150 mg via INTRAVENOUS
  Filled 2012-03-30: qty 15

## 2012-03-30 MED ORDER — ONDANSETRON 8 MG/50ML IVPB (CHCC)
8.0000 mg | Freq: Once | INTRAVENOUS | Status: AC
  Administered 2012-03-30: 8 mg via INTRAVENOUS

## 2012-03-30 MED ORDER — SODIUM CHLORIDE 0.9 % IV SOLN
Freq: Once | INTRAVENOUS | Status: AC
  Administered 2012-03-30: 12:00:00 via INTRAVENOUS

## 2012-03-30 MED ORDER — SODIUM CHLORIDE 0.9 % IJ SOLN
10.0000 mL | INTRAMUSCULAR | Status: DC | PRN
  Administered 2012-03-30: 10 mL
  Filled 2012-03-30: qty 10

## 2012-03-30 MED ORDER — DEXAMETHASONE SODIUM PHOSPHATE 10 MG/ML IJ SOLN
10.0000 mg | Freq: Once | INTRAMUSCULAR | Status: AC
  Administered 2012-03-30: 10 mg via INTRAVENOUS

## 2012-03-30 MED ORDER — LORAZEPAM 1 MG PO TABS
1.0000 mg | ORAL_TABLET | Freq: Once | ORAL | Status: AC
  Administered 2012-03-30: 1 mg via SUBLINGUAL

## 2012-03-30 MED ORDER — HEPARIN SOD (PORK) LOCK FLUSH 100 UNIT/ML IV SOLN
500.0000 [IU] | Freq: Once | INTRAVENOUS | Status: AC | PRN
  Administered 2012-03-30: 500 [IU]
  Filled 2012-03-30: qty 5

## 2012-03-30 NOTE — Patient Instructions (Addendum)
[facility] Discharge Instructions for Patients Receiving Chemotherapy  Today you received the following chemotherapy agent Taxotere.  To help prevent nausea and vomiting after your treatment, we encourage you to take your nausea medication, Begin taking it as often as prescribed for by Dr. Donnie Coffin.    If you develop nausea and vomiting that is not controlled by your nausea medication, call the clinic. If it is after clinic hours your family physician or the after hours number for the clinic or go to the Emergency Department.   BELOW ARE SYMPTOMS THAT SHOULD BE REPORTED IMMEDIATELY:  *FEVER GREATER THAN 100.5 F  *CHILLS WITH OR WITHOUT FEVER  NAUSEA AND VOMITING THAT IS NOT CONTROLLED WITH YOUR NAUSEA MEDICATION  *UNUSUAL SHORTNESS OF BREATH  *UNUSUAL BRUISING OR BLEEDING  TENDERNESS IN MOUTH AND THROAT WITH OR WITHOUT PRESENCE OF ULCERS  *URINARY PROBLEMS  *BOWEL PROBLEMS  UNUSUAL RASH Items with * indicate a potential emergency and should be followed up as soon as possible.  One of the nurses will contact you 24 hours after your treatment. Please let the nurse know about any problems that you may have experienced. Feel free to call the clinic you have any questions or concerns. The clinic phone number is [phone].   I have been informed and understand all the instructions given to me. I know to contact the clinic, my physician, or go to the Emergency Department if any problems should occur. I do not have any questions at this time, but understand that I may call the clinic during office hours   should I have any questions or need assistance in obtaining follow up care.    __________________________________________  _____________  __________ Signature of Patient or Authorized Representative            Date                   Time    __________________________________________ Nurse's Signature

## 2012-03-30 NOTE — Progress Notes (Signed)
1505 Patient at goal rate, 258ml/hr. VSS. Patient with no complaints.

## 2012-03-31 ENCOUNTER — Ambulatory Visit (HOSPITAL_BASED_OUTPATIENT_CLINIC_OR_DEPARTMENT_OTHER): Payer: Medicaid Other

## 2012-03-31 VITALS — BP 152/89 | HR 87 | Temp 97.8°F

## 2012-03-31 DIAGNOSIS — C50912 Malignant neoplasm of unspecified site of left female breast: Secondary | ICD-10-CM

## 2012-03-31 DIAGNOSIS — C50919 Malignant neoplasm of unspecified site of unspecified female breast: Secondary | ICD-10-CM

## 2012-03-31 DIAGNOSIS — Z5189 Encounter for other specified aftercare: Secondary | ICD-10-CM

## 2012-03-31 MED ORDER — PEGFILGRASTIM INJECTION 6 MG/0.6ML
6.0000 mg | Freq: Once | SUBCUTANEOUS | Status: AC
  Administered 2012-03-31: 6 mg via SUBCUTANEOUS

## 2012-04-03 ENCOUNTER — Telehealth: Payer: Self-pay | Admitting: *Deleted

## 2012-04-03 NOTE — Telephone Encounter (Addendum)
PT. HAD THIS PAIN LAST NIGHT. SHE TOOK AN OXYCODONE LAST NIGHT. THE OXYCODONE HELPED AND PT. WENT TO SLEEP. INSTRUCTED PT.'S HUSBAND TO HAVE PT. TAKE AN OXYCODONE NOW. HE VOICES UNDERSTANDING. PT.'S HUSBAND MENTIONED THAT PT. HAD CHILLS THIS MORNING. HER TEMPERATURE WAS 97. THEN HE MENTIONED PT. HAD CHILLS LAST NIGHT AND WRAPPED UP IN THE BEDSPREAD. NO TEMPERATURE WAS TAKEN BUT PT.'S HUSBAND STATES "HER BODY WAS WARM." FLUID INTAKE THE PAST 24 HOURS HAS BEEN 32 OUNCES. PT.'S PHONE #[phone]. THIS NOTE TO DR.RUBIN'S NURSE, DIXIE SMITH,RN.

## 2012-04-05 ENCOUNTER — Other Ambulatory Visit: Payer: Self-pay | Admitting: Physician Assistant

## 2012-04-05 DIAGNOSIS — C50912 Malignant neoplasm of unspecified site of left female breast: Secondary | ICD-10-CM

## 2012-04-06 ENCOUNTER — Other Ambulatory Visit: Payer: Medicaid Other | Admitting: Lab

## 2012-04-06 ENCOUNTER — Ambulatory Visit: Payer: Medicaid Other | Admitting: Physician Assistant

## 2012-04-06 ENCOUNTER — Ambulatory Visit (HOSPITAL_BASED_OUTPATIENT_CLINIC_OR_DEPARTMENT_OTHER): Payer: Medicaid Other | Admitting: Physician Assistant

## 2012-04-06 ENCOUNTER — Other Ambulatory Visit (HOSPITAL_BASED_OUTPATIENT_CLINIC_OR_DEPARTMENT_OTHER): Payer: Medicaid Other | Admitting: Lab

## 2012-04-06 VITALS — BP 159/84 | HR 92 | Temp 98.0°F | Ht 68.0 in | Wt 173.3 lb

## 2012-04-06 DIAGNOSIS — C50919 Malignant neoplasm of unspecified site of unspecified female breast: Secondary | ICD-10-CM

## 2012-04-06 DIAGNOSIS — C50912 Malignant neoplasm of unspecified site of left female breast: Secondary | ICD-10-CM

## 2012-04-06 DIAGNOSIS — B370 Candidal stomatitis: Secondary | ICD-10-CM

## 2012-04-06 DIAGNOSIS — C773 Secondary and unspecified malignant neoplasm of axilla and upper limb lymph nodes: Secondary | ICD-10-CM

## 2012-04-06 LAB — CBC WITH DIFFERENTIAL/PLATELET
BASO%: 1.4 % (ref 0.0–2.0)
Basophils Absolute: 0.2 10*3/uL — ABNORMAL HIGH (ref 0.0–0.1)
EOS%: 2.5 % (ref 0.0–7.0)
Eosinophils Absolute: 0.3 10*3/uL (ref 0.0–0.5)
HCT: 32.1 % — ABNORMAL LOW (ref 34.8–46.6)
HGB: 10.9 g/dL — ABNORMAL LOW (ref 11.6–15.9)
LYMPH%: 17.8 % (ref 14.0–49.7)
MCH: 32.1 pg (ref 25.1–34.0)
MCHC: 34 g/dL (ref 31.5–36.0)
MCV: 94.4 fL (ref 79.5–101.0)
MONO#: 4.3 10*3/uL — ABNORMAL HIGH (ref 0.1–0.9)
MONO%: 33.1 % — ABNORMAL HIGH (ref 0.0–14.0)
NEUT#: 5.9 10*3/uL (ref 1.5–6.5)
NEUT%: 45.2 % (ref 38.4–76.8)
Platelets: 196 10*3/uL (ref 145–400)
RBC: 3.4 10*6/uL — ABNORMAL LOW (ref 3.70–5.45)
RDW: 14.2 % (ref 11.2–14.5)
WBC: 13 10*3/uL — ABNORMAL HIGH (ref 3.9–10.3)
lymph#: 2.3 10*3/uL (ref 0.9–3.3)

## 2012-04-06 NOTE — Progress Notes (Signed)
Hematology and Oncology Follow Up Visit  [name] Riley [id number] 09-18-1951 61 y.o. 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 Right simple mastectomy was clear. She has completed 4/4 cycles of every 3 week FEC, now due to initiate day 1 cycle 1/4 planned adjuvant q3wk Taxotere with Neulasta support on day 2, on 03/30/12.  2. History of profound afebrile neutropenia.  3. History of tick bite left mid back, completion of seven-day course of doxycycline 100 mg by mouth twice a day.    Interim History:   [name] Riley is seen today unaccompanied for followup after her first of 4 planned doses of every 3 week Taxotere given in the adjuvant setting. She is feeling okay today, denying any fevers, chills, or night sweats. No shortness of breath or chest pain issues. She denies nausea, emesis, diarrhea or constipation. No long bone pain currently, but she did experience quite a bit about 3 days after treatment.. No bleeding or bruising symptoms.  She does not note any  neuropathy issues.  She denies any evidence excessive eye tearing.  She has not noted any skin changes of her hands or feet. A detailed review of systems is otherwise noncontributory as noted below.  Review of Systems: Constitutional: feels fatigued.. Eyes: uses glasses ENT: no complaints Cardiovascular: no chest pain or dyspnea on exertion Respiratory: no cough, shortness of breath, or wheezing Neurological: no TIA or stroke symptoms Dermatological: negative Gastrointestinal: no abdominal pain, change in bowel habits, or black or bloody stools Genito-Urinary: no dysuria, trouble voiding,  or hematuria Hematological and Lymphatic: negative Breast: s/p B mastectomies. Musculoskeletal: negative Remaining ROS negative.   Medications:   I have reviewed the patient's current medications.  Current Outpatient Prescriptions  Medication Sig Dispense Refill  . aspirin 81 MG tablet Take 81 mg by mouth daily.        Marland Kitchen dexamethasone (DECADRON) 4 MG tablet Take 2 tablets by mouth once a day on the day after chemotherapy and then take 2 tablets two times a day for 2 days. Take with food.  30 tablet  1  . fish oil-omega-3 fatty acids 1000 MG capsule Take 2 g by mouth at bedtime.       Marland Kitchen levothyroxine (SYNTHROID, LEVOTHROID) 75 MCG tablet Take 75 mcg by mouth daily.       Marland Kitchen lidocaine-prilocaine (EMLA) cream Apply topically as needed. APPLY TO PAC 1-2 HOURS PRIOR TO ACCESS  30 g  1  . LORazepam (ATIVAN) 0.5 MG tablet Take 1 tablet (0.5 mg total) by mouth every 6 (six) hours as needed (Nausea or vomiting).  90 tablet  0  . metoprolol (TOPROL-XL) 100 MG 24 hr tablet Take 100 mg by mouth daily.       . Multiple Vitamins-Minerals (MULTIVITAMIN WITH MINERALS) tablet Take 1 tablet by mouth daily.        . ondansetron (ZOFRAN) 8 MG tablet Take 1 tablet two times a day as needed for nausea or vomiting starting on the third day after chemotherapy.  30 tablet  1  . prochlorperazine (COMPAZINE) 10 MG tablet Take 1 tablet (10 mg total) by mouth every 6 (six) hours as needed (Nausea or vomiting).  30 tablet  1  . prochlorperazine (COMPAZINE) 25 MG suppository Place 1 suppository (25 mg total) rectally every 12 (twelve) hours as needed for nausea.  12 suppository  3   No current facility-administered medications for this visit.   Facility-Administered Medications Ordered in Other Visits  Medication Dose Route Frequency Provider Last Rate Last Dose  . lidocaine-prilocaine (EMLA) cream   Topical Once Pierce Crane, MD        Allergies:  Allergies  Allergen Reactions  . Contrast Media (Iodinated  Diagnostic Agents) Shortness Of Breath and Rash  . Food Shortness Of Breath    Cantaloupe    Physical Exam: Filed Vitals:   04/06/12 1129  BP: 159/84  Pulse: 92  Temp: 98 F (36.7 C)    Body mass index is 26.35 kg/(m^2). Weight: 173 lbs. HEENT:  Sclerae anicteric, conjunctivae pink.  Oropharynx is basically clear, but mild changes consistant with mild candidiasis.   Nodes:  No cervical, supraclavicular, or axillary lymphadenopathy palpated.  Breast Exam:  Deferred Lungs:  Clear to auscultation bilaterally.  No crackles, rhonchi, or wheezes.   Heart:  Regular rate and rhythm.   Abdomen:  Soft, nontender.  Positive bowel sounds.  No organomegaly or masses palpated.   Musculoskeletal:  No focal spinal tenderness to palpation.  Extremities:  Benign.  No peripheral edema or cyanosis, no evidence of lymphedema of the left upper extremity at this time.  Skin:  Benign, except for some flushing.  No PPE changes. Neuro:  Nonfocal, alert and oriented x 3.   Lab Results: Lab Results  Component Value Date   WBC 13.0* 04/06/2012   HGB 10.9* 04/06/2012   HCT 32.1* 04/06/2012   MCV 94.4 04/06/2012   PLT 196 04/06/2012   NEUTROABS 5.9 04/06/2012     Chemistry      Component Value Date/Time   NA 138 03/29/2012 1300   K 3.7 03/29/2012 1300   CL 103 03/29/2012 1300   CO2 24 03/29/2012 1300   BUN 10 03/29/2012 1300   CREATININE 0.67 03/29/2012 1300      Component Value Date/Time   CALCIUM 10.1 03/29/2012 1300   ALKPHOS 109 03/29/2012 1300   AST 36 03/29/2012 1300   ALT 37* 03/29/2012 1300   BILITOT 0.5 03/29/2012 1300      Lab Results  Component Value Date   LABCA2 36 12/29/2011   Echocardiogram: 12/15/11 Study Conclusions - Left ventricle: Systolic function was normal. The estimated ejection fraction was in the range of 55% to 60%. Wall motion was normal; there were no regional wall motion abnormalities. Doppler parameters are consistent with abnormal left ventricular relaxation (grade  1 diastolic dysfunction). - Aortic valve: There was no stenosis. Trivial regurgitation. - Mitral valve: Mild regurgitation. - Left atrium: The atrium was at the upper limits of normal in size. - Right ventricle: The cavity size was normal. Systolic function was normal. - Pulmonary arteries: No complete TR doppler jet so unable to estimate PA systolic pressure. - Inferior vena cava: The vessel was normal in size; the respirophasic diameter changes were in the normal range (= 50%); findings are consistent with normal central venous pressure. Impressions: - Normal LV size and systolic function, EF 55-60%. Normal RV size and systolic function. No significant valvular dysfunction. 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 Right simple mastectomy was clear. She has completed 4/4 cycles of every 3 week FEC, currntly day 7 cycle 1/4 planned adjuvant q3wk Taxotere with Neulasta support on day 2.  2. History of profound afebrile neutropenia.  3. History of tick bite left mid back, completion of seven-day course of doxycycline 100 mg by mouth twice a day.  4. Left great toenail changes.  5. Mild oral candidosis.  Case reviewed with Dr. Pierce Crane.   Plan:  [name] Riley will initiate Diflucan 100mg  orally daily, prescription provided.  I have also provided prescription for 2% viscous lidocaine based mucositis mouthwash.  She will return on 04/26/12 for follow up exam prior to day1, cycle 2/4 planned every 3 week T/C. This plan was reviewed with the patient, who voices understanding  and agreement.  She knows to call with any changes or problems.  Mishal Probert T, PA-C 04/06/12

## 2012-04-25 ENCOUNTER — Encounter (INDEPENDENT_AMBULATORY_CARE_PROVIDER_SITE_OTHER): Payer: Self-pay | Admitting: Surgery

## 2012-04-26 ENCOUNTER — Ambulatory Visit (HOSPITAL_BASED_OUTPATIENT_CLINIC_OR_DEPARTMENT_OTHER): Payer: Medicaid Other | Admitting: Oncology

## 2012-04-26 ENCOUNTER — Other Ambulatory Visit: Payer: Self-pay | Admitting: *Deleted

## 2012-04-26 ENCOUNTER — Ambulatory Visit (HOSPITAL_BASED_OUTPATIENT_CLINIC_OR_DEPARTMENT_OTHER): Payer: Medicaid Other

## 2012-04-26 ENCOUNTER — Ambulatory Visit: Payer: Medicaid Other | Admitting: Oncology

## 2012-04-26 ENCOUNTER — Other Ambulatory Visit (HOSPITAL_BASED_OUTPATIENT_CLINIC_OR_DEPARTMENT_OTHER): Payer: Medicaid Other | Admitting: Lab

## 2012-04-26 VITALS — BP 156/100 | HR 99 | Temp 98.5°F | Ht 68.0 in | Wt 174.7 lb

## 2012-04-26 VITALS — BP 140/78 | HR 71

## 2012-04-26 DIAGNOSIS — C50919 Malignant neoplasm of unspecified site of unspecified female breast: Secondary | ICD-10-CM

## 2012-04-26 DIAGNOSIS — C50912 Malignant neoplasm of unspecified site of left female breast: Secondary | ICD-10-CM

## 2012-04-26 DIAGNOSIS — C773 Secondary and unspecified malignant neoplasm of axilla and upper limb lymph nodes: Secondary | ICD-10-CM

## 2012-04-26 DIAGNOSIS — Z170 Estrogen receptor positive status [ER+]: Secondary | ICD-10-CM

## 2012-04-26 DIAGNOSIS — Z5111 Encounter for antineoplastic chemotherapy: Secondary | ICD-10-CM

## 2012-04-26 LAB — CBC WITH DIFFERENTIAL/PLATELET
BASO%: 0.1 % (ref 0.0–2.0)
Basophils Absolute: 0 10*3/uL (ref 0.0–0.1)
EOS%: 0 % (ref 0.0–7.0)
Eosinophils Absolute: 0 10*3/uL (ref 0.0–0.5)
HCT: 33 % — ABNORMAL LOW (ref 34.8–46.6)
HGB: 11.4 g/dL — ABNORMAL LOW (ref 11.6–15.9)
LYMPH%: 13.8 % — ABNORMAL LOW (ref 14.0–49.7)
MCH: 32.1 pg (ref 25.1–34.0)
MCHC: 34.5 g/dL (ref 31.5–36.0)
MCV: 93 fL (ref 79.5–101.0)
MONO#: 1 10*3/uL — ABNORMAL HIGH (ref 0.1–0.9)
MONO%: 8.1 % (ref 0.0–14.0)
NEUT#: 9.2 10*3/uL — ABNORMAL HIGH (ref 1.5–6.5)
NEUT%: 78 % — ABNORMAL HIGH (ref 38.4–76.8)
Platelets: 317 10*3/uL (ref 145–400)
RBC: 3.55 10*6/uL — ABNORMAL LOW (ref 3.70–5.45)
RDW: 13.8 % (ref 11.2–14.5)
WBC: 11.8 10*3/uL — ABNORMAL HIGH (ref 3.9–10.3)
lymph#: 1.6 10*3/uL (ref 0.9–3.3)
nRBC: 0 % (ref 0–0)

## 2012-04-26 MED ORDER — SODIUM CHLORIDE 0.9 % IJ SOLN
10.0000 mL | INTRAMUSCULAR | Status: DC | PRN
  Administered 2012-04-26: 10 mL
  Filled 2012-04-26: qty 10

## 2012-04-26 MED ORDER — SODIUM CHLORIDE 0.9 % IV SOLN
Freq: Once | INTRAVENOUS | Status: AC
  Administered 2012-04-26: 13:00:00 via INTRAVENOUS

## 2012-04-26 MED ORDER — DEXAMETHASONE 4 MG PO TABS: ORAL_TABLET | ORAL | Status: DC

## 2012-04-26 MED ORDER — ONDANSETRON 8 MG/50ML IVPB (CHCC)
8.0000 mg | Freq: Once | INTRAVENOUS | Status: AC
  Administered 2012-04-26: 8 mg via INTRAVENOUS

## 2012-04-26 MED ORDER — DOCETAXEL CHEMO INJECTION 160 MG/16ML
75.0000 mg/m2 | Freq: Once | INTRAVENOUS | Status: AC
  Administered 2012-04-26: 150 mg via INTRAVENOUS
  Filled 2012-04-26: qty 15

## 2012-04-26 MED ORDER — HEPARIN SOD (PORK) LOCK FLUSH 100 UNIT/ML IV SOLN
500.0000 [IU] | Freq: Once | INTRAVENOUS | Status: AC | PRN
  Administered 2012-04-26: 500 [IU]
  Filled 2012-04-26: qty 5

## 2012-04-26 MED ORDER — DEXAMETHASONE SODIUM PHOSPHATE 10 MG/ML IJ SOLN
10.0000 mg | Freq: Once | INTRAMUSCULAR | Status: AC
  Administered 2012-04-26: 10 mg via INTRAVENOUS

## 2012-04-26 NOTE — Telephone Encounter (Signed)
Called rx for dex [facility]" [phone] and gave the follow directions for use. Take 2 tabs po bid the day before chemo. Take 2 tabs po pm the day of chemo, take 2 tabs po bid the day after chemo

## 2012-04-26 NOTE — Progress Notes (Signed)
Hematology and Oncology Follow Up Visit  [name] Riley [id number] 1950/12/23 61 y.o. 04/26/2012    HPI: [name] Riley is a 61 year-old [place] woman with a history of a T2 N1a infiltrating ductal carcinoma of the left breast for which she underwent a left modified radical mastectomy with axillary node dissection which revealed 3/18 nodes with metastatic disease and evidence of extracapsular extension, ER/PR positive at 89/81% respectively, Ki-67 at 79%, HER-2 negative. Right simple mastectomy was clear. She has completed 4/4 cycles of every 3 week FEC, now due to initiate day 1 cycle 2/4 planned adjuvant q3wk Taxotere with Neulasta support on day 2, on 03/30/12.  2. History of profound afebrile neutropenia.  3. History of tick bite left mid back, completion of seven-day course of doxycycline 100 mg by mouth twice a day.    Interim History:   [name] Riley is seen today unaccompanied for followup prior to her second dose of every 3 week Taxotere. She has done fairly well. She did have some joint pains, and bone pain when after Neulasta. This was actually quite severe. This is not something she has experienced before. She denies tearing or  changes or discomfort in her hands. Appetite is good weight is stable.  Review of Systems: Constitutional: Feels well. Eyes: uses glasses ENT: no complaints Cardiovascular: no chest pain or dyspnea on exertion Respiratory: no cough, shortness of breath, or wheezing Neurological: no TIA or stroke symptoms Dermatological: negative Gastrointestinal: no abdominal pain, change in bowel habits, or black or bloody stools Genito-Urinary: no dysuria, trouble voiding, or hematuria Hematological and Lymphatic: negative Breast: s/p B mastectomies. Musculoskeletal: negative Remaining ROS negative.   Medications:   I have reviewed the patient's current medications.  Current Outpatient Prescriptions  Medication Sig Dispense Refill  . Alum & Mag  Hydroxide-Simeth (MAGIC MOUTHWASH W/LIDOCAINE) SOLN Take 5 mLs by mouth 4 (four) times daily as needed.      . fish oil-omega-3 fatty acids 1000 MG capsule Take 2 g by mouth at bedtime.       . fluconazole (DIFLUCAN) 100 MG tablet Take 100 mg by mouth daily.      Marland Kitchen levothyroxine (SYNTHROID, LEVOTHROID) 75 MCG tablet Take 75 mcg by mouth daily.       Marland Kitchen lidocaine-prilocaine (EMLA) cream Apply topically as needed. APPLY TO PAC 1-2 HOURS PRIOR TO ACCESS  30 g  1  . LORazepam (ATIVAN) 0.5 MG tablet Take 1 tablet (0.5 mg total) by mouth every 6 (six) hours as needed (Nausea or vomiting).  90 tablet  0  . metoprolol (TOPROL-XL) 100 MG 24 hr tablet Take 100 mg by mouth daily.       . Multiple Vitamins-Minerals (MULTIVITAMIN WITH MINERALS) tablet Take 1 tablet by mouth daily.        . ondansetron (ZOFRAN) 8 MG tablet Take 1 tablet two times a day as needed for nausea or vomiting starting on the third day after chemotherapy.  30 tablet  1  . prochlorperazine (COMPAZINE) 10 MG tablet Take 1 tablet (10 mg total) by mouth every 6 (six) hours as needed (Nausea or vomiting).  30 tablet  1  . prochlorperazine (COMPAZINE) 25 MG suppository Place 1 suppository (25 mg total) rectally every 12 (twelve) hours as needed for nausea.  12 suppository  3  . aspirin 81 MG tablet Take 81 mg by mouth daily.        Marland Kitchen dexamethasone (DECADRON) 4 MG tablet Take 2 tablets by mouth once a day on the  day after chemotherapy and then take 2 tablets two times a day for 2 days. Take with food.  30 tablet  1   No current facility-administered medications for this visit.   Facility-Administered Medications Ordered in Other Visits  Medication Dose Route Frequency Provider Last Rate Last Dose  . lidocaine-prilocaine (EMLA) cream   Topical Once Pierce Crane, MD        Allergies:  Allergies  Allergen Reactions  . Contrast Media (Iodinated Diagnostic Agents) Shortness Of Breath and Rash  . Food Shortness Of Breath    Cantaloupe     Physical Exam: Filed Vitals:   04/26/12 1130  BP: 156/100  Pulse: 99  Temp: 98.5 F (36.9 C)    Body mass index is 26.56 kg/(m^2). Weight: 175 lbs. HEENT:  Sclerae anicteric, conjunctivae pink.  Oropharynx clear.  No mucositis or candidiasis.   Nodes:  No cervical, supraclavicular, or axillary lymphadenopathy palpated.  Breast Exam:  Deferred Lungs:  Clear to auscultation bilaterally.  No crackles, rhonchi, or wheezes.   Heart:  Regular rate and rhythm.   Abdomen:  Soft, nontender.  Positive bowel sounds.  No organomegaly or masses palpated.   Musculoskeletal:  No focal spinal tenderness to palpation.  Extremities:  Benign.  No peripheral edema or cyanosis, no evidence of lymphedema of the left upper extremity at this time.  Her left great toenail does show evidence of change, no exudate noted. Skin:  Benign, except she does have some evidence of flushing/rash on her upper back area anterior chest. No folliculitis component. No evidence of excoriation. Neuro:  Nonfocal, alert and oriented x 3.   Lab Results: Lab Results  Component Value Date   WBC 11.8* 04/26/2012   HGB 11.4* 04/26/2012   HCT 33.0* 04/26/2012   MCV 93.0 04/26/2012   PLT 317 04/26/2012   NEUTROABS 9.2* 04/26/2012     Chemistry      Component Value Date/Time   NA 138 03/29/2012 1300   K 3.7 03/29/2012 1300   CL 103 03/29/2012 1300   CO2 24 03/29/2012 1300   BUN 10 03/29/2012 1300   CREATININE 0.67 03/29/2012 1300      Component Value Date/Time   CALCIUM 10.1 03/29/2012 1300   ALKPHOS 109 03/29/2012 1300   AST 36 03/29/2012 1300   ALT 37* 03/29/2012 1300   BILITOT 0.5 03/29/2012 1300      Lab Results  Component Value Date   LABCA2 36 12/29/2011   Echocardiogram: 12/15/11 Study Conclusions - Left ventricle: Systolic function was normal. The estimated ejection fraction was in the range of 55% to 60%. Wall motion was normal; there were no regional wall motion abnormalities. Doppler parameters are  consistent with abnormal left ventricular relaxation (grade 1 diastolic dysfunction). - Aortic valve: There was no stenosis. Trivial regurgitation. - Mitral valve: Mild regurgitation. - Left atrium: The atrium was at the upper limits of normal in size. - Right ventricle: The cavity size was normal. Systolic function was normal. - Pulmonary arteries: No complete TR doppler jet so unable to estimate PA systolic pressure. - Inferior vena cava: The vessel was normal in size; the respirophasic diameter changes were in the normal range (= 50%); findings are consistent with normal central venous pressure. Impressions: - Normal LV size and systolic function, EF 55-60%. Normal RV size and systolic function. No significant valvular dysfunction. 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 Right simple mastectomy was clear. She has completed 4/4 cycles of every 3 week FEC, now due to initiate day 1 cycle 2/4 planned adjuvant q3wk Taxotere with Neulasta support on day 2, 2. History of profound afebrile neutropenia.  3. History of tick bite left mid back, completion of seven-day course of doxycycline 100 mg by mouth twice a day.  4. Left great toenail changes.     Plan:  Naimah is doing well. She is scheduled to return in a week's time for nadir check. She'll receive Neulasta tomorrow. We went through the schedule of taking Claritin before her injection. Kiyla Ringler, md 03/29/12

## 2012-04-26 NOTE — Patient Instructions (Signed)
 [facility] Discharge Instructions for Patients Receiving Chemotherapy  Today you received the following chemotherapy agents taxotere  To help prevent nausea and vomiting after your treatment, we encourage you to take your nausea medication   If you develop nausea and vomiting that is not controlled by your nausea medication, call the clinic. If it is after clinic hours your family physician or the after hours number for the clinic or go to the Emergency Department.   BELOW ARE SYMPTOMS THAT SHOULD BE REPORTED IMMEDIATELY:  *FEVER GREATER THAN 100.5 F  *CHILLS WITH OR WITHOUT FEVER  NAUSEA AND VOMITING THAT IS NOT CONTROLLED WITH YOUR NAUSEA MEDICATION  *UNUSUAL SHORTNESS OF BREATH  *UNUSUAL BRUISING OR BLEEDING  TENDERNESS IN MOUTH AND THROAT WITH OR WITHOUT PRESENCE OF ULCERS  *URINARY PROBLEMS  *BOWEL PROBLEMS  UNUSUAL RASH Items with * indicate a potential emergency and should be followed up as soon as possible.  . The clinic phone number is [phone].   I have been informed and understand all the instructions given to me. I know to contact the clinic, my physician, or go to the Emergency Department if any problems should occur. I do not have any questions at this time, but understand that I may call the clinic during office hours   should I have any questions or need assistance in obtaining follow up care.    __________________________________________  _____________  __________ Signature of Patient or Authorized Representative            Date                   Time    __________________________________________ Nurse's Signature

## 2012-04-27 ENCOUNTER — Ambulatory Visit (HOSPITAL_BASED_OUTPATIENT_CLINIC_OR_DEPARTMENT_OTHER): Payer: Medicaid Other

## 2012-04-27 ENCOUNTER — Ambulatory Visit: Payer: Medicaid Other | Admitting: Oncology

## 2012-04-27 ENCOUNTER — Other Ambulatory Visit: Payer: Medicaid Other | Admitting: Lab

## 2012-04-27 VITALS — BP 133/83 | HR 70 | Temp 97.9°F

## 2012-04-27 DIAGNOSIS — C50912 Malignant neoplasm of unspecified site of left female breast: Secondary | ICD-10-CM

## 2012-04-27 DIAGNOSIS — C50919 Malignant neoplasm of unspecified site of unspecified female breast: Secondary | ICD-10-CM

## 2012-04-27 LAB — COMPREHENSIVE METABOLIC PANEL
ALT: 26 U/L (ref 0–35)
AST: 24 U/L (ref 0–37)
Albumin: 4.3 g/dL (ref 3.5–5.2)
Alkaline Phosphatase: 95 U/L (ref 39–117)
BUN: 15 mg/dL (ref 6–23)
CO2: 24 mEq/L (ref 19–32)
Calcium: 10.1 mg/dL (ref 8.4–10.5)
Chloride: 104 mEq/L (ref 96–112)
Creatinine, Ser: 0.51 mg/dL (ref 0.50–1.10)
Glucose, Bld: 102 mg/dL — ABNORMAL HIGH (ref 70–99)
Potassium: 3.6 mEq/L (ref 3.5–5.3)
Sodium: 140 mEq/L (ref 135–145)
Total Bilirubin: 0.5 mg/dL (ref 0.3–1.2)
Total Protein: 6.9 g/dL (ref 6.0–8.3)

## 2012-04-27 LAB — VITAMIN D 25 HYDROXY (VIT D DEFICIENCY, FRACTURES): Vit D, 25-Hydroxy: 33 ng/mL (ref 30–89)

## 2012-04-27 MED ORDER — PEGFILGRASTIM INJECTION 6 MG/0.6ML
6.0000 mg | Freq: Once | SUBCUTANEOUS | Status: AC
  Administered 2012-04-27: 6 mg via SUBCUTANEOUS
  Filled 2012-04-27: qty 0.6

## 2012-05-03 ENCOUNTER — Other Ambulatory Visit: Payer: Self-pay | Admitting: Physician Assistant

## 2012-05-03 DIAGNOSIS — C50912 Malignant neoplasm of unspecified site of left female breast: Secondary | ICD-10-CM

## 2012-05-04 ENCOUNTER — Ambulatory Visit (HOSPITAL_BASED_OUTPATIENT_CLINIC_OR_DEPARTMENT_OTHER): Payer: Medicaid Other | Admitting: Physician Assistant

## 2012-05-04 ENCOUNTER — Telehealth: Payer: Self-pay | Admitting: *Deleted

## 2012-05-04 ENCOUNTER — Other Ambulatory Visit (HOSPITAL_BASED_OUTPATIENT_CLINIC_OR_DEPARTMENT_OTHER): Payer: Medicaid Other | Admitting: Lab

## 2012-05-04 VITALS — BP 145/89 | HR 86 | Temp 98.8°F | Ht 68.0 in | Wt 172.9 lb

## 2012-05-04 DIAGNOSIS — C50919 Malignant neoplasm of unspecified site of unspecified female breast: Secondary | ICD-10-CM

## 2012-05-04 DIAGNOSIS — B370 Candidal stomatitis: Secondary | ICD-10-CM

## 2012-05-04 DIAGNOSIS — C50912 Malignant neoplasm of unspecified site of left female breast: Secondary | ICD-10-CM

## 2012-05-04 DIAGNOSIS — C50419 Malignant neoplasm of upper-outer quadrant of unspecified female breast: Secondary | ICD-10-CM

## 2012-05-04 DIAGNOSIS — C773 Secondary and unspecified malignant neoplasm of axilla and upper limb lymph nodes: Secondary | ICD-10-CM

## 2012-05-04 DIAGNOSIS — Z170 Estrogen receptor positive status [ER+]: Secondary | ICD-10-CM

## 2012-05-04 LAB — CBC WITH DIFFERENTIAL/PLATELET
BASO%: 0.8 % (ref 0.0–2.0)
Basophils Absolute: 0.2 10*3/uL — ABNORMAL HIGH (ref 0.0–0.1)
EOS%: 0.5 % (ref 0.0–7.0)
Eosinophils Absolute: 0.1 10*3/uL (ref 0.0–0.5)
HCT: 34.5 % — ABNORMAL LOW (ref 34.8–46.6)
HGB: 11.2 g/dL — ABNORMAL LOW (ref 11.6–15.9)
LYMPH%: 13.2 % — ABNORMAL LOW (ref 14.0–49.7)
MCH: 31.6 pg (ref 25.1–34.0)
MCHC: 32.4 g/dL (ref 31.5–36.0)
MCV: 97.5 fL (ref 79.5–101.0)
MONO#: 2.1 10*3/uL — ABNORMAL HIGH (ref 0.1–0.9)
MONO%: 8.5 % (ref 0.0–14.0)
NEUT#: 19.1 10*3/uL — ABNORMAL HIGH (ref 1.5–6.5)
NEUT%: 77 % — ABNORMAL HIGH (ref 38.4–76.8)
Platelets: 191 10*3/uL (ref 145–400)
RBC: 3.53 10*6/uL — ABNORMAL LOW (ref 3.70–5.45)
RDW: 14.4 % (ref 11.2–14.5)
WBC: 24.8 10*3/uL — ABNORMAL HIGH (ref 3.9–10.3)
lymph#: 3.3 10*3/uL (ref 0.9–3.3)

## 2012-05-04 NOTE — Telephone Encounter (Signed)
Made patient appointment for 05-24-2012 06-07-2012 05-17-2012 05-18-2012 printed calendar and gave to the patient sent michelle email to set up patient's treatment

## 2012-05-04 NOTE — Progress Notes (Signed)
Hematology and Oncology Follow Up Visit  YONA KOSEK [id number] 08/08/1951 61 y.o. 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 Right simple mastectomy was clear. She has completed 4/4 cycles of every 3 week FEC, currently day 7 cycle 2/4 planned adjuvant q3wk Taxotere with Neulasta support on day 2, on 03/30/12.  2. History of profound afebrile neutropenia.  3. History of tick bite left mid back, completion of seven-day course of doxycycline 100 mg by mouth twice a day.    Interim History:   [name] Riley is seen today accompanied by her husband for followup after her second of 4 planned doses of every 3 week Taxotere given in the adjuvant setting. She is feeling okay today, denying any fevers, chills, or night sweats. No shortness of breath or chest pain issues. She denies nausea, emesis, diarrhea or constipation. No long bone pain currently, but she did experience quite a bit about 3 days after treatment, Claritin did seem to help. No bleeding or bruising symptoms.  She does not note any  neuropathy issues.  She denies any evidence excessive eye tearing.  She has not noted any skin changes of her hands or feet. A detailed review of systems is otherwise noncontributory as noted below.  Review of Systems: Constitutional: feels fatigued.. Eyes: uses glasses ENT: no complaints Cardiovascular: no chest pain or dyspnea on exertion Respiratory: no cough, shortness of breath, or wheezing Neurological: no TIA or stroke symptoms Dermatological: negative Gastrointestinal: no abdominal pain, change in bowel habits, or black or bloody stools Genito-Urinary:  no dysuria, trouble voiding, or hematuria Hematological and Lymphatic: negative Breast: s/p B mastectomies. Musculoskeletal: negative Remaining ROS negative.   Medications:   I have reviewed the patient's current medications.  Current Outpatient Prescriptions  Medication Sig Dispense Refill  . Alum & Mag Hydroxide-Simeth (MAGIC MOUTHWASH W/LIDOCAINE) SOLN Take 5 mLs by mouth 4 (four) times daily as needed.      Marland Kitchen aspirin 81 MG tablet Take 81 mg by mouth daily.        Marland Kitchen dexamethasone (DECADRON) 4 MG tablet Take 2 tabs po bid the day before chemo, take 2 tabs po pm the day of chemo and take 2  tabs bid the day after chemo  20 tablet  1  . fish oil-omega-3 fatty acids 1000 MG capsule Take 2 g by mouth at bedtime.       . fluconazole (DIFLUCAN) 100 MG tablet Take 100 mg by mouth daily.      Marland Kitchen levothyroxine (SYNTHROID, LEVOTHROID) 75 MCG tablet Take 75 mcg by mouth daily.       Marland Kitchen lidocaine-prilocaine (EMLA) cream Apply topically as needed. APPLY TO PAC 1-2 HOURS PRIOR TO ACCESS  30 g  1  . LORazepam (ATIVAN) 0.5 MG tablet Take 1 tablet (0.5 mg total) by mouth every 6 (six) hours as needed (Nausea or vomiting).  90 tablet  0  . metoprolol (TOPROL-XL) 100 MG 24 hr tablet Take 100 mg by mouth daily.       . Multiple Vitamins-Minerals (MULTIVITAMIN WITH MINERALS) tablet Take 1 tablet by mouth daily.        . ondansetron (ZOFRAN) 8 MG tablet Take 1 tablet  two times a day as needed for nausea or vomiting starting on the third day after chemotherapy.  30 tablet  1  . prochlorperazine (COMPAZINE) 10 MG tablet Take 1 tablet (10 mg total) by mouth every 6 (six) hours as needed (Nausea or vomiting).  30 tablet  1  . prochlorperazine (COMPAZINE) 25 MG suppository Place 1 suppository (25 mg total) rectally every 12 (twelve) hours as needed for nausea.  12 suppository  3   No current facility-administered medications for this visit.   Facility-Administered Medications Ordered in Other Visits  Medication  Dose Route Frequency Provider Last Rate Last Dose  . lidocaine-prilocaine (EMLA) cream   Topical Once Pierce Crane, MD        Allergies:  Allergies  Allergen Reactions  . Contrast Media (Iodinated Diagnostic Agents) Shortness Of Breath and Rash  . Food Shortness Of Breath    Cantaloupe    Physical Exam: Filed Vitals:   05/04/12 1507  BP: 145/89  Pulse: 86  Temp: 98.8 F (37.1 C)    Body mass index is 26.29 kg/(m^2). Weight: 172 lbs. HEENT:  Sclerae anicteric, conjunctivae pink.  Oropharynx is basically clear, but mild changes consistant with mild candidiasis.  Nodes:  No cervical, supraclavicular, or axillary lymphadenopathy palpated.  Breast Exam:  Deferred Lungs:  Clear to auscultation bilaterally.  No crackles, rhonchi, or wheezes.   Heart:  Regular rate and rhythm.   Abdomen:  Soft, nontender.  Positive bowel sounds.  No organomegaly or masses palpated.   Musculoskeletal:  No focal spinal tenderness to palpation.  Extremities:  Benign.  No peripheral edema or cyanosis, no evidence of lymphedema of the left upper extremity at this time.  Skin:  Benign, except for some flushing.  No PPE changes. Neuro:  Nonfocal, alert and oriented x 3.   Lab Results: Lab Results  Component Value Date   WBC 24.8* 05/04/2012   HGB 11.2* 05/04/2012   HCT 34.5* 05/04/2012   MCV 97.5 05/04/2012   PLT 191 05/04/2012   NEUTROABS 19.1* 05/04/2012     Chemistry      Component Value Date/Time   NA 140 04/26/2012 1107   K 3.6 04/26/2012 1107   CL 104 04/26/2012 1107   CO2 24 04/26/2012 1107   BUN 15 04/26/2012 1107   CREATININE 0.51 04/26/2012 1107      Component Value Date/Time   CALCIUM 10.1 04/26/2012 1107   ALKPHOS 95 04/26/2012 1107   AST 24 04/26/2012 1107   ALT 26 04/26/2012 1107   BILITOT 0.5 04/26/2012 1107      Lab Results  Component Value Date   LABCA2 36 12/29/2011   Echocardiogram: 12/15/11 Study Conclusions - Left ventricle: Systolic function was normal. The estimated ejection  fraction was in the range of 55% to 60%. Wall motion was normal; there were no regional wall motion abnormalities. Doppler parameters are consistent with abnormal left ventricular relaxation (grade 1 diastolic dysfunction). - Aortic valve: There was no stenosis. Trivial regurgitation. - Mitral valve: Mild regurgitation. - Left atrium: The atrium was at the upper limits of normal in size. - Right ventricle: The cavity size was normal. Systolic function was normal. - Pulmonary arteries: No complete TR doppler jet so unable to estimate PA systolic pressure. - Inferior vena cava: The vessel was normal in size; the respirophasic diameter changes were in the normal range (= 50%); findings are consistent with normal central venous pressure. Impressions: - Normal LV size and systolic function, EF 55-60%. Normal RV size and  systolic function. No significant valvular dysfunction. 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 Right simple mastectomy was clear. She has completed 4/4 cycles of every 3 week FEC, currntly day 7 cycle 2/4 planned adjuvant q3wk Taxotere with Neulasta support on day 2.  2. History of profound afebrile neutropenia.  3. History of tick bite left mid back, completion of seven-day course of doxycycline 100 mg by mouth twice a day.  4. Left great toenail changes.  5. Mild oral candidosis.  Case reviewed with Dr. Pierce Crane.   Plan:  Fatim will continue Diflucan 100mg  orally daily, prescription provided.  She will return on 05/17/12 for follow up exam prior  to day 1, cycle 3/4 planned every 3 week T/C. This plan was reviewed with the patient, who voices understanding and agreement.  She knows to call with any changes or problems.  Helia Haese T, PA-C 05/04/12

## 2012-05-07 ENCOUNTER — Telehealth: Payer: Self-pay | Admitting: *Deleted

## 2012-05-07 NOTE — Telephone Encounter (Signed)
Per staff message and POF I have scheduled appts.  JMW  

## 2012-05-08 ENCOUNTER — Ambulatory Visit: Payer: Medicaid Other | Admitting: Oncology

## 2012-05-15 ENCOUNTER — Ambulatory Visit: Payer: Medicaid Other | Admitting: Oncology

## 2012-05-17 ENCOUNTER — Other Ambulatory Visit (HOSPITAL_BASED_OUTPATIENT_CLINIC_OR_DEPARTMENT_OTHER): Payer: Medicaid Other | Admitting: Lab

## 2012-05-17 ENCOUNTER — Other Ambulatory Visit: Payer: Self-pay | Admitting: Family

## 2012-05-17 ENCOUNTER — Ambulatory Visit (HOSPITAL_BASED_OUTPATIENT_CLINIC_OR_DEPARTMENT_OTHER): Payer: Medicaid Other | Admitting: Oncology

## 2012-05-17 ENCOUNTER — Ambulatory Visit (HOSPITAL_BASED_OUTPATIENT_CLINIC_OR_DEPARTMENT_OTHER): Payer: Medicaid Other

## 2012-05-17 VITALS — BP 152/83 | HR 101 | Temp 98.8°F | Ht 68.0 in | Wt 175.6 lb

## 2012-05-17 DIAGNOSIS — C773 Secondary and unspecified malignant neoplasm of axilla and upper limb lymph nodes: Secondary | ICD-10-CM

## 2012-05-17 DIAGNOSIS — C50912 Malignant neoplasm of unspecified site of left female breast: Secondary | ICD-10-CM

## 2012-05-17 DIAGNOSIS — C50919 Malignant neoplasm of unspecified site of unspecified female breast: Secondary | ICD-10-CM

## 2012-05-17 DIAGNOSIS — Z5111 Encounter for antineoplastic chemotherapy: Secondary | ICD-10-CM

## 2012-05-17 DIAGNOSIS — C50419 Malignant neoplasm of upper-outer quadrant of unspecified female breast: Secondary | ICD-10-CM

## 2012-05-17 DIAGNOSIS — B370 Candidal stomatitis: Secondary | ICD-10-CM

## 2012-05-17 LAB — COMPREHENSIVE METABOLIC PANEL
ALT: 28 U/L (ref 0–35)
AST: 23 U/L (ref 0–37)
Albumin: 4.5 g/dL (ref 3.5–5.2)
Alkaline Phosphatase: 88 U/L (ref 39–117)
BUN: 19 mg/dL (ref 6–23)
CO2: 27 mEq/L (ref 19–32)
Calcium: 10.2 mg/dL (ref 8.4–10.5)
Chloride: 105 mEq/L (ref 96–112)
Creatinine, Ser: 0.63 mg/dL (ref 0.50–1.10)
Glucose, Bld: 149 mg/dL — ABNORMAL HIGH (ref 70–99)
Potassium: 3.6 mEq/L (ref 3.5–5.3)
Sodium: 140 mEq/L (ref 135–145)
Total Bilirubin: 0.4 mg/dL (ref 0.3–1.2)
Total Protein: 6.6 g/dL (ref 6.0–8.3)

## 2012-05-17 LAB — CBC WITH DIFFERENTIAL/PLATELET
BASO%: 0.1 % (ref 0.0–2.0)
Basophils Absolute: 0 10*3/uL (ref 0.0–0.1)
EOS%: 0 % (ref 0.0–7.0)
Eosinophils Absolute: 0 10*3/uL (ref 0.0–0.5)
HCT: 32.6 % — ABNORMAL LOW (ref 34.8–46.6)
HGB: 11.2 g/dL — ABNORMAL LOW (ref 11.6–15.9)
LYMPH%: 13.3 % — ABNORMAL LOW (ref 14.0–49.7)
MCH: 31.5 pg (ref 25.1–34.0)
MCHC: 34.4 g/dL (ref 31.5–36.0)
MCV: 91.8 fL (ref 79.5–101.0)
MONO#: 0.9 10*3/uL (ref 0.1–0.9)
MONO%: 7.5 % (ref 0.0–14.0)
NEUT#: 9 10*3/uL — ABNORMAL HIGH (ref 1.5–6.5)
NEUT%: 79.1 % — ABNORMAL HIGH (ref 38.4–76.8)
Platelets: 292 10*3/uL (ref 145–400)
RBC: 3.55 10*6/uL — ABNORMAL LOW (ref 3.70–5.45)
RDW: 13.8 % (ref 11.2–14.5)
WBC: 11.4 10*3/uL — ABNORMAL HIGH (ref 3.9–10.3)
lymph#: 1.5 10*3/uL (ref 0.9–3.3)
nRBC: 0 % (ref 0–0)

## 2012-05-17 LAB — LACTATE DEHYDROGENASE: LDH: 227 U/L (ref 94–250)

## 2012-05-17 MED ORDER — ONDANSETRON 8 MG/50ML IVPB (CHCC)
8.0000 mg | Freq: Once | INTRAVENOUS | Status: AC
  Administered 2012-05-17: 8 mg via INTRAVENOUS

## 2012-05-17 MED ORDER — DOCETAXEL CHEMO INJECTION 160 MG/16ML
75.0000 mg/m2 | Freq: Once | INTRAVENOUS | Status: AC
  Administered 2012-05-17: 150 mg via INTRAVENOUS
  Filled 2012-05-17: qty 15

## 2012-05-17 MED ORDER — HEPARIN SOD (PORK) LOCK FLUSH 100 UNIT/ML IV SOLN
500.0000 [IU] | Freq: Once | INTRAVENOUS | Status: AC | PRN
  Administered 2012-05-17: 500 [IU]
  Filled 2012-05-17: qty 5

## 2012-05-17 MED ORDER — DEXAMETHASONE SODIUM PHOSPHATE 10 MG/ML IJ SOLN
10.0000 mg | Freq: Once | INTRAMUSCULAR | Status: AC
  Administered 2012-05-17: 10 mg via INTRAVENOUS

## 2012-05-17 MED ORDER — SODIUM CHLORIDE 0.9 % IV SOLN: Freq: Once | INTRAVENOUS | Status: DC

## 2012-05-17 MED ORDER — SODIUM CHLORIDE 0.9 % IJ SOLN
10.0000 mL | INTRAMUSCULAR | Status: DC | PRN
  Administered 2012-05-17: 10 mL
  Filled 2012-05-17: qty 10

## 2012-05-17 NOTE — Progress Notes (Signed)
Hematology and Oncology Follow Up Visit  [name] Riley [id number] [birth year] 61 y.o. 05/17/2012    HPI: [name] Riley is a 61 year-old [place] woman with a history of a T2 N1a infiltrating ductal carcinoma of the left breast for which she underwent a left modified radical mastectomy with axillary node dissection which revealed 3/18 nodes with metastatic disease and evidence of extracapsular extension, ER/PR positive at 89/81% respectively, Ki-67 at 79%, HER-2 negative. Right simple mastectomy was clear. She has completed 4/4 cycles of every 3 week FEC, currently day 1 cycle 3/4 planned adjuvant q3wk Taxotere with Neulasta support on day 2  2. History of profound afebrile neutropenia.  3. History of tick bite left mid back, completion of seven-day course of doxycycline 100 mg by mouth twice a day.    Interim History:   [name] Riley is seen today for day 1 cycle 3 of 2-3 week Taxotere. She is doing fairly well. She does have some pain related to Neulasta takes Claritin for 3-5 days. She started to have some tearing and uses Visine. She does use a mouthwash for week after treatment because she does develop a bad taste in her mouth which is metallic. It has been since of thrush as well. Aside from that she is doing well. She has no other complaints with the chemotherapy she denies any numbness tingling in hands and feet.  Review of Systems: Constitutional: feels fatigued.. Eyes: uses glasses ENT: no complaints Cardiovascular: no chest pain or dyspnea on exertion Respiratory: no cough, shortness of breath, or wheezing Neurological: no TIA or stroke symptoms Dermatological: negative Gastrointestinal: no abdominal pain, change in bowel habits, or black or bloody stools Genito-Urinary: no dysuria, trouble voiding, or hematuria Hematological and Lymphatic: negative Breast: s/p B mastectomies. Musculoskeletal: negative Remaining ROS negative.   Medications:   I have reviewed the patient's  current medications.  Current Outpatient Prescriptions  Medication Sig Dispense Refill  . Alum & Mag Hydroxide-Simeth (MAGIC MOUTHWASH W/LIDOCAINE) SOLN Take 5 mLs by mouth 4 (four) times daily as needed.      Marland Kitchen dexamethasone (DECADRON) 4 MG tablet Take 2 tabs po bid the day before chemo, take 2 tabs po pm the day of chemo and take 2  tabs bid the day after chemo  20 tablet  1  . fish oil-omega-3 fatty acids 1000 MG capsule Take 2 g by mouth at bedtime.       . fluconazole (DIFLUCAN) 100 MG tablet Take 100 mg by mouth daily.      Marland Kitchen levothyroxine (SYNTHROID, LEVOTHROID) 75 MCG tablet Take 75 mcg by mouth daily.       Marland Kitchen lidocaine-prilocaine (EMLA) cream Apply topically as needed. APPLY TO PAC 1-2 HOURS PRIOR TO ACCESS  30 g  1  . LORazepam (ATIVAN) 0.5 MG tablet Take 1 tablet (0.5 mg total) by mouth every 6 (six) hours as needed (Nausea or vomiting).  90 tablet  0  . metoprolol (TOPROL-XL) 100 MG 24 hr tablet Take 100 mg by mouth daily.       . Multiple Vitamins-Minerals (MULTIVITAMIN WITH MINERALS) tablet Take 1 tablet by mouth daily.        . ondansetron (ZOFRAN) 8 MG tablet Take 1 tablet two times a day as needed for nausea or vomiting starting on the third day after chemotherapy.  30 tablet  1  . prochlorperazine (COMPAZINE) 10 MG tablet Take 1 tablet (10 mg total) by mouth every 6 (six) hours as needed (Nausea or vomiting).  30  tablet  1  . prochlorperazine (COMPAZINE) 25 MG suppository Place 1 suppository (25 mg total) rectally every 12 (twelve) hours as needed for nausea.  12 suppository  3  . aspirin 81 MG tablet Take 81 mg by mouth daily.         No current facility-administered medications for this visit.   Facility-Administered Medications Ordered in Other Visits  Medication Dose Route Frequency Provider Last Rate Last Dose  . lidocaine-prilocaine (EMLA) cream   Topical Once Pierce Crane, MD        Allergies:  Allergies  Allergen Reactions  . Contrast Media (Iodinated Diagnostic  Agents) Shortness Of Breath and Rash  . Food Shortness Of Breath    Cantaloupe    Physical Exam: Filed Vitals:   05/17/12 1024  BP: 152/83  Pulse: 101  Temp: 98.8 F (37.1 C)    Body mass index is 26.70 kg/(m^2). Weight: 172 lbs. HEENT:  Sclerae anicteric, conjunctivae pink.  Oropharynx is basically clear, but mild changes consistant with mild candidiasis.  Nodes:  No cervical, supraclavicular, or axillary lymphadenopathy palpated.  Breast Exam:  Deferred Lungs:  Clear to auscultation bilaterally.  No crackles, rhonchi, or wheezes.   Heart:  Regular rate and rhythm.   Abdomen:  Soft, nontender.  Positive bowel sounds.  No organomegaly or masses palpated.   Musculoskeletal:  No focal spinal tenderness to palpation.  Extremities:  Benign.  No peripheral edema or cyanosis, no evidence of lymphedema of the left upper extremity at this time.  Skin:  Benign, except for some flushing.  No PPE changes. Neuro:  Nonfocal, alert and oriented x 3.   Lab Results: Lab Results  Component Value Date   WBC 24.8* 05/04/2012   HGB 11.2* 05/04/2012   HCT 34.5* 05/04/2012   MCV 97.5 05/04/2012   PLT 191 05/04/2012   NEUTROABS 19.1* 05/04/2012     Chemistry      Component Value Date/Time   NA 140 04/26/2012 1107   K 3.6 04/26/2012 1107   CL 104 04/26/2012 1107   CO2 24 04/26/2012 1107   BUN 15 04/26/2012 1107   CREATININE 0.51 04/26/2012 1107      Component Value Date/Time   CALCIUM 10.1 04/26/2012 1107   ALKPHOS 95 04/26/2012 1107   AST 24 04/26/2012 1107   ALT 26 04/26/2012 1107   BILITOT 0.5 04/26/2012 1107      Lab Results  Component Value Date   LABCA2 36 12/29/2011   Echocardiogram: 12/15/11 Study Conclusions - Left ventricle: Systolic function was normal. The estimated ejection fraction was in the range of 55% to 60%. Wall motion was normal; there were no regional wall motion abnormalities. Doppler parameters are consistent with abnormal left ventricular relaxation (grade 1 diastolic  dysfunction). - Aortic valve: There was no stenosis. Trivial regurgitation. - Mitral valve: Mild regurgitation. - Left atrium: The atrium was at the upper limits of normal in size. - Right ventricle: The cavity size was normal. Systolic function was normal. - Pulmonary arteries: No complete TR doppler jet so unable to estimate PA systolic pressure. - Inferior vena cava: The vessel was normal in size; the respirophasic diameter changes were in the normal range (= 50%); findings are consistent with normal central venous pressure. Impressions: - Normal LV size and systolic function, EF 55-60%. Normal RV size and systolic function. No significant valvular dysfunction. Prepared and Electronically Authenticated by Marca Ancona, MD     Assessment:  [name] Riley is a 61 year-old [facility] woman with  a history of a T2 N1a infiltrating ductal carcinoma of the left breast for which she underwent a left modified radical mastectomy with axillary node dissection which revealed 3/18 nodes with metastatic disease and evidence of extracapsular extension, ER/PR positive at 89/81% respectively, Ki-67 at 79%, HER-2 negative. Right simple mastectomy was clear. She has completed 4/4 cycles of every 3 week FEC, currntly day 1 cycle 3/4 planned adjuvant q3wk Taxotere with Neulasta support on day 2.  2. History of profound afebrile neutropenia.  3. History of tick bite left mid back, completion of seven-day course of doxycycline 100 mg by mouth twice a day.  4. Left great toenail changes.  5. Mild oral candidosis.     Plan:  No positive ER/PR positive breast cancer due for cycle 3 of Taxotere. Doing fairly well we will see her next week for a midcycle check..  This plan was reviewed with the patient, who voices understanding and agreement.  She knows to call with any changes or problems.  Jazzmyne Rasnick,md 05/04/12

## 2012-05-18 ENCOUNTER — Ambulatory Visit (HOSPITAL_BASED_OUTPATIENT_CLINIC_OR_DEPARTMENT_OTHER): Payer: Medicaid Other

## 2012-05-18 ENCOUNTER — Other Ambulatory Visit: Payer: Self-pay | Admitting: Family

## 2012-05-18 VITALS — BP 129/74 | HR 80 | Temp 97.1°F

## 2012-05-18 DIAGNOSIS — C50919 Malignant neoplasm of unspecified site of unspecified female breast: Secondary | ICD-10-CM

## 2012-05-18 DIAGNOSIS — C773 Secondary and unspecified malignant neoplasm of axilla and upper limb lymph nodes: Secondary | ICD-10-CM

## 2012-05-18 DIAGNOSIS — C50912 Malignant neoplasm of unspecified site of left female breast: Secondary | ICD-10-CM

## 2012-05-18 MED ORDER — PEGFILGRASTIM INJECTION 6 MG/0.6ML
6.0000 mg | Freq: Once | SUBCUTANEOUS | Status: AC
  Administered 2012-05-18: 6 mg via SUBCUTANEOUS
  Filled 2012-05-18: qty 0.6

## 2012-05-18 NOTE — Patient Instructions (Signed)
Call MD for problems 

## 2012-05-25 ENCOUNTER — Other Ambulatory Visit (HOSPITAL_BASED_OUTPATIENT_CLINIC_OR_DEPARTMENT_OTHER): Payer: Medicaid Other | Admitting: Lab

## 2012-05-25 ENCOUNTER — Ambulatory Visit (HOSPITAL_BASED_OUTPATIENT_CLINIC_OR_DEPARTMENT_OTHER): Payer: Medicaid Other | Admitting: Oncology

## 2012-05-25 VITALS — BP 164/87 | HR 96 | Temp 98.7°F | Resp 20 | Ht 68.0 in | Wt 172.0 lb

## 2012-05-25 DIAGNOSIS — C773 Secondary and unspecified malignant neoplasm of axilla and upper limb lymph nodes: Secondary | ICD-10-CM

## 2012-05-25 DIAGNOSIS — C50912 Malignant neoplasm of unspecified site of left female breast: Secondary | ICD-10-CM

## 2012-05-25 DIAGNOSIS — C50919 Malignant neoplasm of unspecified site of unspecified female breast: Secondary | ICD-10-CM

## 2012-05-25 DIAGNOSIS — B370 Candidal stomatitis: Secondary | ICD-10-CM

## 2012-05-25 DIAGNOSIS — Z170 Estrogen receptor positive status [ER+]: Secondary | ICD-10-CM

## 2012-05-25 LAB — CBC WITH DIFFERENTIAL/PLATELET
BASO%: 1.7 % (ref 0.0–2.0)
Basophils Absolute: 0.4 10*3/uL — ABNORMAL HIGH (ref 0.0–0.1)
EOS%: 0.3 % (ref 0.0–7.0)
Eosinophils Absolute: 0.1 10*3/uL (ref 0.0–0.5)
HCT: 32.9 % — ABNORMAL LOW (ref 34.8–46.6)
HGB: 11.2 g/dL — ABNORMAL LOW (ref 11.6–15.9)
LYMPH%: 14.8 % (ref 14.0–49.7)
MCH: 31.6 pg (ref 25.1–34.0)
MCHC: 34 g/dL (ref 31.5–36.0)
MCV: 92.9 fL (ref 79.5–101.0)
MONO#: 5.7 10*3/uL — ABNORMAL HIGH (ref 0.1–0.9)
MONO%: 25.1 % — ABNORMAL HIGH (ref 0.0–14.0)
NEUT#: 13.2 10*3/uL — ABNORMAL HIGH (ref 1.5–6.5)
NEUT%: 58.1 % (ref 38.4–76.8)
Platelets: 233 10*3/uL (ref 145–400)
RBC: 3.54 10*6/uL — ABNORMAL LOW (ref 3.70–5.45)
RDW: 14.2 % (ref 11.2–14.5)
WBC: 22.7 10*3/uL — ABNORMAL HIGH (ref 3.9–10.3)
lymph#: 3.4 10*3/uL — ABNORMAL HIGH (ref 0.9–3.3)
nRBC: 1 % — ABNORMAL HIGH (ref 0–0)

## 2012-05-25 MED ORDER — MAGIC MOUTHWASH W/LIDOCAINE: 10.0000 mL | Freq: Three times a day (TID) | ORAL | Status: DC | PRN

## 2012-05-25 NOTE — Progress Notes (Signed)
Hematology and Oncology Follow Up Visit  [name] Riley [id number] 1951/05/13 61 y.o. 05/25/2012    HPI: [name] Riley is a 61 year-old [place] woman with a history of a T2 N1a infiltrating ductal carcinoma of the left breast for which she underwent a left modified radical mastectomy with axillary node dissection which revealed 3/18 nodes with metastatic disease and evidence of extracapsular extension, ER/PR positive at 89/81% respectively, Ki-67 at 79%, HER-2 negative. Right simple mastectomy was clear. She has completed 4/4 cycles of every 3 week FEC, currently day 7 cycle 3/4 planned adjuvant q3wk Taxotere with Neulasta support on day 2  2. History of profound afebrile neutropenia.  3. History of tick bite left mid back, completion of seven-day course of doxycycline 100 mg by mouth twice a day.    Interim History:   [name] Riley is seen today for day 1 cycle 3 of 2-3 week Taxotere. She is doing fairly well. She does have some pain related to Neulasta takes Claritin for 3-5 days. She started to have some tearing and uses Visine. Without history mouth persists and now she has some soreness on her tongue. She is using by obtained. This seems to help a little bit but it does burn. Review of Systems: Constitutional: feels fatigued.. Eyes: uses glasses ENT: no complaints, mouth pain Cardiovascular: no chest pain or dyspnea on exertion Respiratory: no cough, shortness of breath, or wheezing Neurological: no TIA or stroke symptoms Dermatological: negative Gastrointestinal: no abdominal pain, change in bowel habits, or black or bloody stools Genito-Urinary: no dysuria, trouble voiding, or hematuria Hematological and Lymphatic: negative Breast: s/p B mastectomies. Musculoskeletal: negative Remaining ROS negative.   Medications:   I have reviewed the patient's current medications.  Current Outpatient Prescriptions  Medication Sig Dispense Refill  . Alum & Mag Hydroxide-Simeth (MAGIC  MOUTHWASH W/LIDOCAINE) SOLN Take 5 mLs by mouth 4 (four) times daily as needed.      Marland Kitchen aspirin 81 MG tablet Take 81 mg by mouth daily.        Marland Kitchen dexamethasone (DECADRON) 4 MG tablet Take 2 tabs po bid the day before chemo, take 2 tabs po pm the day of chemo and take 2  tabs bid the day after chemo  20 tablet  1  . fish oil-omega-3 fatty acids 1000 MG capsule Take 2 g by mouth at bedtime.       . fluconazole (DIFLUCAN) 100 MG tablet Take 100 mg by mouth daily.      Marland Kitchen levothyroxine (SYNTHROID, LEVOTHROID) 75 MCG tablet Take 75 mcg by mouth daily.       Marland Kitchen lidocaine-prilocaine (EMLA) cream Apply topically as needed. APPLY TO PAC 1-2 HOURS PRIOR TO ACCESS  30 g  1  . LORazepam (ATIVAN) 0.5 MG tablet Take 1 tablet (0.5 mg total) by mouth every 6 (six) hours as needed (Nausea or vomiting).  90 tablet  0  . metoprolol (TOPROL-XL) 100 MG 24 hr tablet Take 100 mg by mouth daily.       . Multiple Vitamins-Minerals (MULTIVITAMIN WITH MINERALS) tablet Take 1 tablet by mouth daily.        . ondansetron (ZOFRAN) 8 MG tablet Take 1 tablet two times a day as needed for nausea or vomiting starting on the third day after chemotherapy.  30 tablet  1  . prochlorperazine (COMPAZINE) 10 MG tablet Take 1 tablet (10 mg total) by mouth every 6 (six) hours as needed (Nausea or vomiting).  30 tablet  1  . prochlorperazine (  COMPAZINE) 25 MG suppository Place 1 suppository (25 mg total) rectally every 12 (twelve) hours as needed for nausea.  12 suppository  3   No current facility-administered medications for this visit.   Facility-Administered Medications Ordered in Other Visits  Medication Dose Route Frequency Provider Last Rate Last Dose  . lidocaine-prilocaine (EMLA) cream   Topical Once Pierce Crane, MD        Allergies:  Allergies  Allergen Reactions  . Contrast Media (Iodinated Diagnostic Agents) Shortness Of Breath and Rash  . Food Shortness Of Breath    Cantaloupe    Physical Exam: Filed Vitals:   05/25/12  1219  BP: 164/87  Pulse: 96  Temp: 98.7 F (37.1 C)  Resp: 20    Body mass index is 26.15 kg/(m^2). Weight: 172 lbs. HEENT:  Sclerae anicteric, conjunctivae pink.  Oropharynx is basically clear, on the right side of the tongue there are 2 small lesions representative of oral mucositis without obvious evidence of superinfection and/or herpetic infection.  Nodes:  No cervical, supraclavicular, or axillary lymphadenopathy palpated.  Breast Exam:  Deferred Lungs:  Clear to auscultation bilaterally.  No crackles, rhonchi, or wheezes.   Heart:  Regular rate and rhythm.   Abdomen:  Soft, nontender.  Positive bowel sounds.  No organomegaly or masses palpated.   Musculoskeletal:  No focal spinal tenderness to palpation.  Extremities:  Benign.  No peripheral edema or cyanosis, no evidence of lymphedema of the left upper extremity at this time.  Skin:  Benign, except for some flushing.  No PPE changes. Neuro:  Nonfocal, alert and oriented x 3.   Lab Results: Lab Results  Component Value Date   WBC 22.7* 05/25/2012   HGB 11.2* 05/25/2012   HCT 32.9* 05/25/2012   MCV 92.9 05/25/2012   PLT 233 05/25/2012   NEUTROABS 13.2* 05/25/2012     Chemistry      Component Value Date/Time   NA 140 05/17/2012 1006   K 3.6 05/17/2012 1006   CL 105 05/17/2012 1006   CO2 27 05/17/2012 1006   BUN 19 05/17/2012 1006   CREATININE 0.63 05/17/2012 1006      Component Value Date/Time   CALCIUM 10.2 05/17/2012 1006   ALKPHOS 88 05/17/2012 1006   AST 23 05/17/2012 1006   ALT 28 05/17/2012 1006   BILITOT 0.4 05/17/2012 1006      Lab Results  Component Value Date   LABCA2 36 12/29/2011   Echocardiogram: 12/15/11 Study Conclusions - Left ventricle: Systolic function was normal. The estimated ejection fraction was in the range of 55% to 60%. Wall motion was normal; there were no regional wall motion abnormalities. Doppler parameters are consistent with abnormal left ventricular relaxation (grade 1 diastolic dysfunction). - Aortic  valve: There was no stenosis. Trivial regurgitation. - Mitral valve: Mild regurgitation. - Left atrium: The atrium was at the upper limits of normal in size. - Right ventricle: The cavity size was normal. Systolic function was normal. - Pulmonary arteries: No complete TR doppler jet so unable to estimate PA systolic pressure. - Inferior vena cava: The vessel was normal in size; the respirophasic diameter changes were in the normal range (= 50%); findings are consistent with normal central venous pressure. Impressions: - Normal LV size and systolic function, EF 55-60%. Normal RV size and systolic function. No significant valvular dysfunction. 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 Right simple mastectomy was clear. She has completed 4/4 cycles of every 3 week FEC, currntly day 7cycle 3/4 planned adjuvant q3wk Taxotere with Neulasta support on day 2.  2. History of profound afebrile neutropenia.  3. History of tick bite left mid back, completion of seven-day course of doxycycline 100 mg by mouth twice a day.  4. Left great toenail changes.  5. Mild oral candidosis.  6. history of mild mucositis.   Plan:  Tells doing well except for her mouth which she is experiencing metallic taste and some soreness, I recommended Magic mouthwash and discontinuing the biotin mouthwash. We'll see her in about 2 weeks' time. Counts are adequate.     Tarah Buboltz,md 05/04/12

## 2012-06-07 ENCOUNTER — Ambulatory Visit (HOSPITAL_BASED_OUTPATIENT_CLINIC_OR_DEPARTMENT_OTHER): Payer: Medicaid Other

## 2012-06-07 ENCOUNTER — Other Ambulatory Visit (HOSPITAL_BASED_OUTPATIENT_CLINIC_OR_DEPARTMENT_OTHER): Payer: Medicaid Other | Admitting: Lab

## 2012-06-07 ENCOUNTER — Other Ambulatory Visit: Payer: Self-pay | Admitting: *Deleted

## 2012-06-07 VITALS — BP 145/70 | HR 88 | Temp 98.0°F | Resp 20

## 2012-06-07 DIAGNOSIS — C50912 Malignant neoplasm of unspecified site of left female breast: Secondary | ICD-10-CM

## 2012-06-07 DIAGNOSIS — C50919 Malignant neoplasm of unspecified site of unspecified female breast: Secondary | ICD-10-CM

## 2012-06-07 DIAGNOSIS — Z5111 Encounter for antineoplastic chemotherapy: Secondary | ICD-10-CM

## 2012-06-07 DIAGNOSIS — C773 Secondary and unspecified malignant neoplasm of axilla and upper limb lymph nodes: Secondary | ICD-10-CM

## 2012-06-07 LAB — CBC WITH DIFFERENTIAL/PLATELET
BASO%: 0.1 % (ref 0.0–2.0)
Basophils Absolute: 0 10*3/uL (ref 0.0–0.1)
EOS%: 0 % (ref 0.0–7.0)
Eosinophils Absolute: 0 10*3/uL (ref 0.0–0.5)
HCT: 32.3 % — ABNORMAL LOW (ref 34.8–46.6)
HGB: 11.1 g/dL — ABNORMAL LOW (ref 11.6–15.9)
LYMPH%: 12.2 % — ABNORMAL LOW (ref 14.0–49.7)
MCH: 31.4 pg (ref 25.1–34.0)
MCHC: 34.4 g/dL (ref 31.5–36.0)
MCV: 91.5 fL (ref 79.5–101.0)
MONO#: 0.5 10*3/uL (ref 0.1–0.9)
MONO%: 4.8 % (ref 0.0–14.0)
NEUT#: 7.7 10*3/uL — ABNORMAL HIGH (ref 1.5–6.5)
NEUT%: 82.9 % — ABNORMAL HIGH (ref 38.4–76.8)
Platelets: 274 10*3/uL (ref 145–400)
RBC: 3.53 10*6/uL — ABNORMAL LOW (ref 3.70–5.45)
RDW: 14.5 % (ref 11.2–14.5)
WBC: 9.3 10*3/uL (ref 3.9–10.3)
lymph#: 1.1 10*3/uL (ref 0.9–3.3)
nRBC: 0 % (ref 0–0)

## 2012-06-07 MED ORDER — LORAZEPAM 2 MG/ML IJ SOLN
0.5000 mg | Freq: Once | INTRAMUSCULAR | Status: AC
  Administered 2012-06-07: 0.5 mg via INTRAVENOUS

## 2012-06-07 MED ORDER — DEXAMETHASONE SODIUM PHOSPHATE 10 MG/ML IJ SOLN
10.0000 mg | Freq: Once | INTRAMUSCULAR | Status: AC
  Administered 2012-06-07: 10 mg via INTRAVENOUS

## 2012-06-07 MED ORDER — HEPARIN SOD (PORK) LOCK FLUSH 100 UNIT/ML IV SOLN
500.0000 [IU] | Freq: Once | INTRAVENOUS | Status: AC | PRN
  Administered 2012-06-07: 500 [IU]
  Filled 2012-06-07: qty 5

## 2012-06-07 MED ORDER — SODIUM CHLORIDE 0.9 % IJ SOLN
10.0000 mL | INTRAMUSCULAR | Status: DC | PRN
  Administered 2012-06-07: 10 mL
  Filled 2012-06-07: qty 10

## 2012-06-07 MED ORDER — ONDANSETRON 8 MG/50ML IVPB (CHCC)
8.0000 mg | Freq: Once | INTRAVENOUS | Status: AC
  Administered 2012-06-07: 8 mg via INTRAVENOUS

## 2012-06-07 MED ORDER — DOCETAXEL CHEMO INJECTION 160 MG/16ML
75.0000 mg/m2 | Freq: Once | INTRAVENOUS | Status: AC
  Administered 2012-06-07: 150 mg via INTRAVENOUS
  Filled 2012-06-07: qty 15

## 2012-06-07 MED ORDER — SODIUM CHLORIDE 0.9 % IV SOLN
Freq: Once | INTRAVENOUS | Status: AC
  Administered 2012-06-07: 13:00:00 via INTRAVENOUS

## 2012-06-07 NOTE — Patient Instructions (Signed)
[facility] Discharge Instructions for Patients Receiving Chemotherapy  Today you received the following chemotherapy agents Taxotere.  To help prevent nausea and vomiting after your treatment, we encourage you to take your nausea medication.  If you develop nausea and vomiting that is not controlled by your nausea medication, call the clinic. If it is after clinic hours your family physician or the after hours number for the clinic or go to the Emergency Department.   BELOW ARE SYMPTOMS THAT SHOULD BE REPORTED IMMEDIATELY:  *FEVER GREATER THAN 100.5 F  *CHILLS WITH OR WITHOUT FEVER  NAUSEA AND VOMITING THAT IS NOT CONTROLLED WITH YOUR NAUSEA MEDICATION  *UNUSUAL SHORTNESS OF BREATH  *UNUSUAL BRUISING OR BLEEDING  TENDERNESS IN MOUTH AND THROAT WITH OR WITHOUT PRESENCE OF ULCERS  *URINARY PROBLEMS  *BOWEL PROBLEMS  UNUSUAL RASH Items with * indicate a potential emergency and should be followed up as soon as possible.  One of the nurses will contact you 24 hours after your treatment. Please let the nurse know about any problems that you may have experienced. Feel free to call the clinic you have any questions or concerns. The clinic phone number is [phone].   I have been informed and understand all the instructions given to me. I know to contact the clinic, my physician, or go to the Emergency Department if any problems should occur. I do not have any questions at this time, but understand that I may call the clinic during office hours   should I have any questions or need assistance in obtaining follow up care.    __________________________________________  _____________  __________ Signature of Patient or Authorized Representative            Date                   Time    __________________________________________ Nurse's Signature    

## 2012-06-08 ENCOUNTER — Ambulatory Visit (HOSPITAL_BASED_OUTPATIENT_CLINIC_OR_DEPARTMENT_OTHER): Payer: Medicaid Other

## 2012-06-08 VITALS — BP 134/74 | HR 90 | Temp 98.5°F

## 2012-06-08 DIAGNOSIS — C773 Secondary and unspecified malignant neoplasm of axilla and upper limb lymph nodes: Secondary | ICD-10-CM

## 2012-06-08 DIAGNOSIS — C50912 Malignant neoplasm of unspecified site of left female breast: Secondary | ICD-10-CM

## 2012-06-08 DIAGNOSIS — C50919 Malignant neoplasm of unspecified site of unspecified female breast: Secondary | ICD-10-CM

## 2012-06-08 MED ORDER — PEGFILGRASTIM INJECTION 6 MG/0.6ML
6.0000 mg | Freq: Once | SUBCUTANEOUS | Status: AC
  Administered 2012-06-08: 6 mg via SUBCUTANEOUS
  Filled 2012-06-08: qty 0.6

## 2012-06-13 ENCOUNTER — Other Ambulatory Visit: Payer: Self-pay | Admitting: *Deleted

## 2012-06-14 ENCOUNTER — Telehealth: Payer: Self-pay | Admitting: *Deleted

## 2012-06-14 NOTE — Telephone Encounter (Signed)
CHEMO ON 06/07/12. PT. IS CONSTIPATED. SHE IS STRAINING TO HAVE HER BOWEL MOVEMENTS. ABDOMEN IS SOFT BUT PT. IS HAVING SOME DISCOMFORT IN RECTAL AREA. INSTRUCTED PT. TO START A CLEAR LIQUID DIET NOW AND FOR THE NEXT 24 HOURS. SHE WILL USE DUCOLAX SUPPOSITORY ONE EVERY FOUR HOURS UNTIL RESULTS. PT. IS TO ALSO TAKE ONE DOSE OF MIRALAX NOW. ENCOURAGED PT. TO FORCE FLUIDS TO 64 OUNCES IN A 24 HOUR PERIOD. IF PT. CONTINUES TO VOMIT SHE WILL GO TO THE EMERGENCY ROOM. PT. VOICES UNDERSTANDING. REMINDED PT. OF HER APPOINTMENT TOMORROW WITH NANCY RUDOLPH,NP.

## 2012-06-15 ENCOUNTER — Ambulatory Visit (HOSPITAL_BASED_OUTPATIENT_CLINIC_OR_DEPARTMENT_OTHER): Payer: Medicaid Other | Admitting: Family

## 2012-06-15 ENCOUNTER — Other Ambulatory Visit: Payer: Medicaid Other

## 2012-06-15 ENCOUNTER — Encounter: Payer: Self-pay | Admitting: Family

## 2012-06-15 ENCOUNTER — Telehealth: Payer: Self-pay | Admitting: *Deleted

## 2012-06-15 VITALS — BP 128/75 | HR 90 | Temp 98.9°F | Resp 20 | Ht 68.0 in | Wt 171.0 lb

## 2012-06-15 DIAGNOSIS — Z170 Estrogen receptor positive status [ER+]: Secondary | ICD-10-CM

## 2012-06-15 DIAGNOSIS — C50919 Malignant neoplasm of unspecified site of unspecified female breast: Secondary | ICD-10-CM

## 2012-06-15 DIAGNOSIS — D709 Neutropenia, unspecified: Secondary | ICD-10-CM

## 2012-06-15 DIAGNOSIS — C773 Secondary and unspecified malignant neoplasm of axilla and upper limb lymph nodes: Secondary | ICD-10-CM

## 2012-06-15 LAB — CBC WITH DIFFERENTIAL/PLATELET
BASO%: 0.6 % (ref 0.0–2.0)
Basophils Absolute: 0.1 10*3/uL (ref 0.0–0.1)
EOS%: 0.3 % (ref 0.0–7.0)
Eosinophils Absolute: 0.1 10*3/uL (ref 0.0–0.5)
HCT: 33.6 % — ABNORMAL LOW (ref 34.8–46.6)
HGB: 11.3 g/dL — ABNORMAL LOW (ref 11.6–15.9)
LYMPH%: 12.3 % — ABNORMAL LOW (ref 14.0–49.7)
MCH: 31.4 pg (ref 25.1–34.0)
MCHC: 33.6 g/dL (ref 31.5–36.0)
MCV: 93.3 fL (ref 79.5–101.0)
MONO#: 2.9 10*3/uL — ABNORMAL HIGH (ref 0.1–0.9)
MONO%: 13 % (ref 0.0–14.0)
NEUT#: 16.6 10*3/uL — ABNORMAL HIGH (ref 1.5–6.5)
NEUT%: 73.8 % (ref 38.4–76.8)
Platelets: 311 10*3/uL (ref 145–400)
RBC: 3.6 10*6/uL — ABNORMAL LOW (ref 3.70–5.45)
RDW: 14.9 % — ABNORMAL HIGH (ref 11.2–14.5)
WBC: 22.5 10*3/uL — ABNORMAL HIGH (ref 3.9–10.3)
lymph#: 2.8 10*3/uL (ref 0.9–3.3)
nRBC: 0 % (ref 0–0)

## 2012-06-15 NOTE — Telephone Encounter (Signed)
Made patient appointment for dr.moody for 06-20-2012 starting at 4:00pm made patient appointment for 08-10-2012 starting at 9:00pm with labs and rubin

## 2012-06-15 NOTE — Telephone Encounter (Signed)
Per staff message and POF I have scheudled appts. JMW  

## 2012-06-15 NOTE — Patient Instructions (Addendum)
1. Referral to Dr. Mitzi Hansen for radiation therapy.  2. Will see Dr. Wyman Songster 10/7 and will discuss port removal after radiation.  3. Return in 8 weeks to see Dr. Donnie Coffin at the completion of radiation.  4. She will need antiestrogen therapy.

## 2012-06-15 NOTE — Progress Notes (Signed)
Hematology and Oncology Follow Up Visit  [name] Riley [id number] [birth year] 61 y.o. 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 Right simple mastectomy was clear. She has completed 4/4 cycles of every 3 week FEC, and 4 planned adjuvant q3wk Taxotere with Neulasta support on day 2  2. History of profound afebrile neutropenia.   Interim History:   [name] Riley is seen today for lab only, has completed Taxotere. Has had constipation following chemo 8/22. Received neulasta 8/23 with some bone pain day 3 following injection. Left great toenail has fallen off with some associated pain. Has milder changes of the fingernails. No headache or blurred vision. No cough or shortness of breath. No abdominal pain or new bone pain. Bladder function is normal. Appetite is good, with adequate fluid intake. Remainder of the 10 point  review of systems is negative.   Medications:   I have reviewed the patient's current medications.  Allergies:  Allergies  Allergen Reactions  . Contrast Media (Iodinated Diagnostic Agents) Shortness Of Breath and Rash  . Food Shortness Of Breath    Cantaloupe    Physical Exam: Filed Vitals:   06/15/12 1246  BP: 128/75  Pulse: 90  Temp: 98.9 F (37.2 C)  Resp: 20    Body mass index is 26.00 kg/(m^2). Weight: 172 lbs. HEENT:  Sclerae anicteric, conjunctivae pink.  Oropharynx is basically clear, on the right side of the tongue there are 2 small lesions representative of oral mucositis without obvious evidence of superinfection and/or herpetic infection.  Nodes:  No cervical, supraclavicular, or axillary lymphadenopathy  palpated.  Breast Exam:  Bilateral mastectomy incisions are well healed. No redness of the skin or subcutaneous nodules.  Lungs:  Clear to auscultation bilaterally.  No crackles, rhonchi, or wheezes.   Heart:  Regular rate and rhythm.   Abdomen:  Soft, nontender.  Positive bowel sounds.  No organomegaly or masses palpated.   Musculoskeletal:  No focal spinal tenderness to palpation.  Extremities:  Benign.  No peripheral edema or cyanosis, no evidence of lymphedema of the left upper extremity at this time.  Skin:  Benign, except for some flushing.  No PPE changes. Neuro:  Nonfocal, alert and oriented x 3.   Lab Results: Lab Results  Component Value Date   WBC 22.5* 06/15/2012   HGB 11.3* 06/15/2012   HCT 33.6* 06/15/2012   MCV 93.3 06/15/2012   PLT 311 Large & giant platelets 06/15/2012   NEUTROABS 16.6* 06/15/2012     Chemistry      Component Value Date/Time   NA 140 05/17/2012 1006   K 3.6 05/17/2012 1006   CL 105 05/17/2012 1006   CO2 27 05/17/2012 1006   BUN 19 05/17/2012 1006   CREATININE 0.63 05/17/2012 1006      Component Value Date/Time   CALCIUM 10.2 05/17/2012 1006   ALKPHOS 88 05/17/2012 1006   AST 23 05/17/2012 1006   ALT 28 05/17/2012 1006   BILITOT 0.4 05/17/2012 1006      Lab Results  Component Value Date   [id number] 36 12/29/2011   Assessment:  1. Left breast cancer, completed chemo and surgery. No evidence of disease.  2. Blood counts have  recovered.  3. Left great toenail changes.  Plan:  1. Referral to Dr. Mitzi Hansen for radiation therapy.  2. Will see Dr. Wyman Songster 10/7 and will discuss port removal after radiation.  3. Return in 8 weeks to see Dr. Donnie Coffin at the completion of radiation.  4. She will need antiestrogen therapy.     Colman Cater, FNP

## 2012-06-20 ENCOUNTER — Ambulatory Visit
Admission: RE | Admit: 2012-06-20 | Discharge: 2012-06-20 | Disposition: A | Discharge status: Home / Self Care | Payer: Medicaid Other | Source: Ambulatory Visit | Attending: Radiation Oncology | Admitting: Radiation Oncology

## 2012-06-20 ENCOUNTER — Encounter: Payer: Self-pay | Admitting: Radiation Oncology

## 2012-06-20 VITALS — BP 159/92 | HR 96 | Temp 98.6°F | Resp 20 | Wt 173.2 lb

## 2012-06-20 DIAGNOSIS — Z170 Estrogen receptor positive status [ER+]: Secondary | ICD-10-CM | POA: Insufficient documentation

## 2012-06-20 DIAGNOSIS — Y842 Radiological procedure and radiotherapy as the cause of abnormal reaction of the patient, or of later complication, without mention of misadventure at the time of the procedure: Secondary | ICD-10-CM | POA: Insufficient documentation

## 2012-06-20 DIAGNOSIS — C50919 Malignant neoplasm of unspecified site of unspecified female breast: Secondary | ICD-10-CM | POA: Insufficient documentation

## 2012-06-20 DIAGNOSIS — L589 Radiodermatitis, unspecified: Secondary | ICD-10-CM | POA: Insufficient documentation

## 2012-06-20 DIAGNOSIS — Z79899 Other long term (current) drug therapy: Secondary | ICD-10-CM | POA: Insufficient documentation

## 2012-06-20 DIAGNOSIS — Z510 Encounter for antineoplastic radiation therapy: Secondary | ICD-10-CM | POA: Insufficient documentation

## 2012-06-20 DIAGNOSIS — C50419 Malignant neoplasm of upper-outer quadrant of unspecified female breast: Secondary | ICD-10-CM

## 2012-06-20 DIAGNOSIS — Z7982 Long term (current) use of aspirin: Secondary | ICD-10-CM | POA: Insufficient documentation

## 2012-06-20 DIAGNOSIS — Z9010 Acquired absence of unspecified breast and nipple: Secondary | ICD-10-CM | POA: Insufficient documentation

## 2012-06-20 DIAGNOSIS — B372 Candidiasis of skin and nail: Secondary | ICD-10-CM | POA: Insufficient documentation

## 2012-06-20 HISTORY — DX: Personal history of antineoplastic chemotherapy: Z92.21

## 2012-06-20 NOTE — Progress Notes (Signed)
Please see the Nurse Progress Note in the MD Initial Consult Encounter for this patient. 

## 2012-06-20 NOTE — Progress Notes (Signed)
Pt denies pain but states she occasionally has "stinging pains " in axillas, bilat breasts. Pt scored 9 on distress scale; per pt's request will notify SW to call pt. Pt lost job 1 wk ago, states that is main source of her distress.

## 2012-06-20 NOTE — Progress Notes (Signed)
Radiation Oncology         [phone] ________________________________  Name: [name] Riley MRN: [id number]  Date: 06/20/2012  DOB: 1951-05-30  Follow-Up Visit Note  CC: Josue Hector, MD  Pierce Crane, MD  Manus Rudd, MD  Diagnosis:   Invasive ductal carcinoma of the left breast, T2 N1 A. N0, ER positive, PR positive, HER-2/neu negative  Narrative:  The patient was previously seen in multidisciplinary clinic. Since that time she has proceeded with a modified radical mastectomy on the left and a simple mastectomy on the right. Notable latency was seen on the right. From the specimen on the left the final pathology did return positive for a 4.3 cm invasive ductal carcinoma. It is felt that the anterior soft tissue margin focally involved tumor with the deep margin being free. An additional lateral inferior margin was described as containing carcinoma focally involving the deep margin as well. 3/18 lymph nodes were positive with extracapsular extension present. Lymphovascular space invasion was present. The tumor receptor studies indicated that the tumor was ER positive, PR positive, and HER-2/neu negative. The patient has completed postoperative chemotherapy which she states went relatively well and I am seeing her today for further discussion of postmastectomy radiation on the left.                               ALLERGIES:  is allergic to contrast media and food.  Meds: Current Outpatient Prescriptions  Medication Sig Dispense Refill  . aspirin 81 MG tablet Take 81 mg by mouth daily.        Marland Kitchen dexamethasone (DECADRON) 4 MG tablet Take 2 tabs po bid the day before chemo, take 2 tabs po pm the day of chemo and take 2  tabs bid the day after chemo  20 tablet  1  . fish oil-omega-3 fatty acids 1000 MG capsule Take 2 g by mouth at bedtime.       Marland Kitchen levothyroxine (SYNTHROID, LEVOTHROID) 75 MCG tablet Take 75 mcg by mouth daily.       Marland Kitchen lidocaine-prilocaine (EMLA) cream Apply topically  as needed. APPLY TO PAC 1-2 HOURS PRIOR TO ACCESS  30 g  1  . LORazepam (ATIVAN) 0.5 MG tablet Take 1 tablet (0.5 mg total) by mouth every 6 (six) hours as needed (Nausea or vomiting).  90 tablet  0  . metoprolol (TOPROL-XL) 100 MG 24 hr tablet Take 100 mg by mouth daily.       . Multiple Vitamins-Minerals (MULTIVITAMIN WITH MINERALS) tablet Take 1 tablet by mouth daily.        . ondansetron (ZOFRAN) 8 MG tablet Take 1 tablet two times a day as needed for nausea or vomiting starting on the third day after chemotherapy.  30 tablet  1  . prochlorperazine (COMPAZINE) 10 MG tablet Take 1 tablet (10 mg total) by mouth every 6 (six) hours as needed (Nausea or vomiting).  30 tablet  1  . prochlorperazine (COMPAZINE) 25 MG suppository Place 1 suppository (25 mg total) rectally every 12 (twelve) hours as needed for nausea.  12 suppository  3   No current facility-administered medications for this encounter.   Facility-Administered Medications Ordered in Other Encounters  Medication Dose Route Frequency Provider Last Rate Last Dose  . lidocaine-prilocaine (EMLA) cream   Topical Once Pierce Crane, MD        Physical Findings: The patient is in no acute distress. Patient is alert and  oriented.  weight is 173 lb 3.2 oz (78.563 kg). Her oral temperature is 98.6 F (37 C). Her blood pressure is 159/92 and her pulse is 96. Her respiration is 20. Marland Kitchen   General: Well-developed, in no acute distress HEENT: Normocephalic, atraumatic Cardiovascular: Regular rate and rhythm Respiratory: Clear to auscultation bilaterally; status post bilateral mastectomy. No chest wall lesions or nodules on either side and no axillary adenopathy on either side. The patient is well-healed. GI: Soft, nontender, normal bowel sounds Extremities: No edema present  Lab Findings: Lab Results  Component Value Date   WBC 22.5* 06/15/2012   HGB 11.3* 06/15/2012   HCT 33.6* 06/15/2012   MCV 93.3 06/15/2012   PLT 311 Large & giant platelets  06/15/2012     Radiographic Findings: No results found.  Impression:    Pleasant 61 year old female status post a modified radical mastectomy on the left for a T2 N1 A. N0 invasive ductal carcinoma. She had 3 lymph nodes positive as well as extracapsular extension. She also had some focal areas which were positive along the margins as well. The patient is status post adjuvant chemotherapy.  Plan:  I recommend a course of postmastectomy radiation on the left. No malignancy on the right.  We discussed the typical 6-1/2 week course of treatment. I would treat the patient to the left chest wall and left supraclavicular region using a 4 field technique. We discussed the possible side effects and risks of treatment and all the patient's questions were answered. We did also discuss the possible use of a breath-hold technique to reduce the heart dose. She was interested in this.  The patient therefore will be scheduled for a simulation as soon as this can be arranged and we will proceed with treatment planning.  I spent 30 minutes with the patient today, the majority of which was spent counseling the patient on the diagnosis of cancer and coordinating care.   Radene Gunning, M.D., Ph.D.

## 2012-06-22 ENCOUNTER — Ambulatory Visit: Payer: Medicaid Other

## 2012-06-28 ENCOUNTER — Ambulatory Visit
Admission: RE | Admit: 2012-06-28 | Discharge: 2012-06-28 | Disposition: A | Discharge status: Home / Self Care | Payer: Medicaid Other | Source: Ambulatory Visit | Attending: Radiation Oncology | Admitting: Radiation Oncology

## 2012-06-28 ENCOUNTER — Ambulatory Visit: Payer: Medicaid Other

## 2012-06-28 DIAGNOSIS — C50419 Malignant neoplasm of upper-outer quadrant of unspecified female breast: Secondary | ICD-10-CM

## 2012-07-01 NOTE — Progress Notes (Signed)
  Radiation Oncology         [phone] ________________________________  Name: [name] Riley MRN: [id number]  Date: 06/28/2012  DOB: Mar 09, 1951   SIMULATION AND TREATMENT PLANNING NOTE  The patient presented for simulation prior to beginning her course of radiation treatment for her diagnosis of left-sided breast cancer. The patient was placed in a supine position on a breast board. A customized accuform device was also constructed and this complex treatment device will be used on a daily basis during her treatment. In this fashion, a CT scan was obtained through the chest area and an isocenter was placed near the chest wall at the upper aspect of the right chest. A breath-hold technique was evaluated - this was helpful to decrease the anticipated radiation dose to the heart, and this will be used for her treatment.  The patient will be planned to receive a course of radiation initially to a dose of 50.4 gray. This will consist of a 4 field technique targeting the chest wall as well as the supraclavicular region. Therefore 2 customized medial and lateral tangent fields have been created targeting the chest wall, and also 2 additional customized fields have been designed to treat the supraclavicular region both with a right supraclavicular field and a right posterior axillary boost field. This treatment will be accomplished at 1.8 gray per fraction. A 3D conformal technique will be used. DVH's will be evaluated for the target CTV, heart, and lungs. This technique is necessary to adequately spare the heart will treating the target area. A forward planning technique will also be evaluated to determine if this approach improves the plan. It is anticipated that the patient will then receive a 10 gray boost to the scar plus margin. This will be accomplished at 2 gray per fraction. The final anticipated total dose therefore will correspond to 60.4 gray.    _______________________________   Radene Gunning, MD, PhD

## 2012-07-02 ENCOUNTER — Telehealth: Payer: Self-pay | Admitting: *Deleted

## 2012-07-02 ENCOUNTER — Encounter: Payer: Self-pay | Admitting: *Deleted

## 2012-07-02 NOTE — Telephone Encounter (Signed)
Called patient patient's voicemail has not been set up will keep trying to get in contact with the patient

## 2012-07-03 ENCOUNTER — Other Ambulatory Visit: Payer: Medicaid Other | Admitting: Lab

## 2012-07-03 ENCOUNTER — Telehealth: Payer: Self-pay | Admitting: *Deleted

## 2012-07-03 ENCOUNTER — Ambulatory Visit: Payer: Medicaid Other | Admitting: Family

## 2012-07-03 NOTE — Telephone Encounter (Signed)
Patient confirmed over the phone

## 2012-07-05 ENCOUNTER — Ambulatory Visit
Admission: RE | Admit: 2012-07-05 | Discharge: 2012-07-05 | Disposition: A | Discharge status: Home / Self Care | Payer: Medicaid Other | Source: Ambulatory Visit | Attending: Radiation Oncology | Admitting: Radiation Oncology

## 2012-07-05 DIAGNOSIS — C50419 Malignant neoplasm of upper-outer quadrant of unspecified female breast: Secondary | ICD-10-CM

## 2012-07-06 ENCOUNTER — Ambulatory Visit: Payer: Medicaid Other

## 2012-07-08 NOTE — Progress Notes (Signed)
  Radiation Oncology         ([phone] ________________________________  Name: [name] Riley MRN: [id number]  Date: 07/05/2012  DOB: 05/11/1951  Simulation Verification Note   NARRATIVE: The patient was brought to the treatment unit and placed in the planned treatment position. The clinical setup was verified. Then port films were obtained and uploaded to the radiation oncology medical record software.  The treatment beams were carefully compared against the planned radiation fields. The position, location, and shape of the radiation fields was reviewed. The targeted volume of tissue appears to be appropriately covered by the radiation beams. Based on my personal review, I approved the simulation verification. The patient's treatment will proceed as planned.  ________________________________   Radene Gunning, MD, PhD

## 2012-07-09 ENCOUNTER — Ambulatory Visit
Admission: RE | Admit: 2012-07-09 | Discharge: 2012-07-09 | Disposition: A | Discharge status: Home / Self Care | Payer: Medicaid Other | Source: Ambulatory Visit | Attending: Radiation Oncology | Admitting: Radiation Oncology

## 2012-07-10 ENCOUNTER — Ambulatory Visit
Admission: RE | Admit: 2012-07-10 | Discharge: 2012-07-10 | Disposition: A | Discharge status: Home / Self Care | Payer: Medicaid Other | Source: Ambulatory Visit | Attending: Radiation Oncology | Admitting: Radiation Oncology

## 2012-07-11 ENCOUNTER — Ambulatory Visit
Admission: RE | Admit: 2012-07-11 | Discharge: 2012-07-11 | Disposition: A | Discharge status: Home / Self Care | Payer: Medicaid Other | Source: Ambulatory Visit | Attending: Radiation Oncology | Admitting: Radiation Oncology

## 2012-07-11 DIAGNOSIS — C50419 Malignant neoplasm of upper-outer quadrant of unspecified female breast: Secondary | ICD-10-CM

## 2012-07-11 MED ORDER — ALRA NON-METALLIC DEODORANT (RAD-ONC)
1.0000 "application " | Freq: Once | TOPICAL | Status: AC
  Administered 2012-07-11: 1 via TOPICAL

## 2012-07-11 MED ORDER — RADIAPLEXRX EX GEL
Freq: Once | CUTANEOUS | Status: AC
  Administered 2012-07-11: 15:00:00 via TOPICAL

## 2012-07-11 NOTE — Progress Notes (Signed)
Post sim med, radiation therapy and you book, schedule, flyer sheet on skin prodiucts, mu business card, instructions on skin products when to use, sees MD/Staff Rn weekly/prn, teach back given by patient 3:01 PM

## 2012-07-12 ENCOUNTER — Ambulatory Visit
Admission: RE | Admit: 2012-07-12 | Discharge: 2012-07-12 | Disposition: A | Discharge status: Home / Self Care | Payer: Medicaid Other | Source: Ambulatory Visit | Attending: Radiation Oncology | Admitting: Radiation Oncology

## 2012-07-12 VITALS — BP 151/93 | HR 84 | Temp 98.3°F | Wt 175.8 lb

## 2012-07-12 DIAGNOSIS — C50419 Malignant neoplasm of upper-outer quadrant of unspecified female breast: Secondary | ICD-10-CM

## 2012-07-12 NOTE — Progress Notes (Signed)
Patient here for routine weekly under treat visit with doctor for left breast radiation.Completed 4 treatments thus far.No skin chnages.No fatigue.Patient education completed earlier this week.

## 2012-07-12 NOTE — Progress Notes (Signed)
Department of Radiation Oncology  Phone:  [phone] Fax:        [phone]  Weekly Treatment Note    Name: [name] Riley Date: 07/12/2012 MRN: [id number] DOB: 03-26-1951   Current dose: 7.2 Gy  Current fraction: 4   MEDICATIONS: Current Outpatient Prescriptions  Medication Sig Dispense Refill  . aspirin 81 MG tablet Take 81 mg by mouth daily.        Marland Kitchen dexamethasone (DECADRON) 4 MG tablet Take 2 tabs po bid the day before chemo, take 2 tabs po pm the day of chemo and take 2  tabs bid the day after chemo  20 tablet  1  . fish oil-omega-3 fatty acids 1000 MG capsule Take 2 g by mouth at bedtime.       . hyaluronate sodium (RADIAPLEXRX) GEL Apply 1 application topically 2 (two) times daily. Apply to skin after rad tx and bedtime, not  4 hours prior to rad txs      . levothyroxine (SYNTHROID, LEVOTHROID) 75 MCG tablet Take 75 mcg by mouth daily.       Marland Kitchen lidocaine-prilocaine (EMLA) cream Apply topically as needed. APPLY TO PAC 1-2 HOURS PRIOR TO ACCESS  30 g  1  . LORazepam (ATIVAN) 0.5 MG tablet Take 1 tablet (0.5 mg total) by mouth every 6 (six) hours as needed (Nausea or vomiting).  90 tablet  0  . metoprolol (TOPROL-XL) 100 MG 24 hr tablet Take 100 mg by mouth daily.       . Multiple Vitamins-Minerals (MULTIVITAMIN WITH MINERALS) tablet Take 1 tablet by mouth daily.        . non-metallic deodorant Thornton Papas) MISC Apply 1 application topically daily as needed. Apply after rad and prn,but not before 4 hours prior to rad txs      . ondansetron (ZOFRAN) 8 MG tablet Take 1 tablet two times a day as needed for nausea or vomiting starting on the third day after chemotherapy.  30 tablet  1  . prochlorperazine (COMPAZINE) 10 MG tablet Take 1 tablet (10 mg total) by mouth every 6 (six) hours as needed (Nausea or vomiting).  30 tablet  1  . prochlorperazine (COMPAZINE) 25 MG suppository Place 1 suppository (25 mg total) rectally every 12 (twelve) hours as needed for nausea.  12 suppository  3    No current facility-administered medications for this encounter.   Facility-Administered Medications Ordered in Other Encounters  Medication Dose Route Frequency Provider Last Rate Last Dose  . lidocaine-prilocaine (EMLA) cream   Topical Once Pierce Crane, MD         ALLERGIES: Contrast media and Food   LABORATORY DATA:  Lab Results  Component Value Date   WBC 22.5* 06/15/2012   HGB 11.3* 06/15/2012   HCT 33.6* 06/15/2012   MCV 93.3 06/15/2012   PLT 311 Large & giant platelets 06/15/2012   Lab Results  Component Value Date   NA 140 05/17/2012   K 3.6 05/17/2012   CL 105 05/17/2012   CO2 27 05/17/2012   Lab Results  Component Value Date   ALT 28 05/17/2012   AST 23 05/17/2012   ALKPHOS 88 05/17/2012   BILITOT 0.4 05/17/2012     NARRATIVE: [name] Riley was seen today for weekly treatment management. The chart was checked and the patient's films were reviewed. The patient is doing well. No problems thus far with her treatment. No complaints.  PHYSICAL EXAMINATION: weight is 175 lb 12.8 oz (79.742 kg). Her temperature is 98.3  F (36.8 C). Her blood pressure is 151/93 and her pulse is 84.        ASSESSMENT: The patient is doing satisfactorily with treatment.  PLAN: We will continue with the patient's radiation treatment as planned.

## 2012-07-13 ENCOUNTER — Ambulatory Visit
Admission: RE | Admit: 2012-07-13 | Discharge: 2012-07-13 | Disposition: A | Discharge status: Home / Self Care | Payer: Medicaid Other | Source: Ambulatory Visit | Attending: Radiation Oncology | Admitting: Radiation Oncology

## 2012-07-13 ENCOUNTER — Encounter (INDEPENDENT_AMBULATORY_CARE_PROVIDER_SITE_OTHER): Payer: Medicaid Other | Admitting: Surgery

## 2012-07-16 ENCOUNTER — Ambulatory Visit
Admission: RE | Admit: 2012-07-16 | Discharge: 2012-07-16 | Disposition: A | Discharge status: Home / Self Care | Payer: Medicaid Other | Source: Ambulatory Visit | Attending: Radiation Oncology | Admitting: Radiation Oncology

## 2012-07-16 NOTE — Addendum Note (Signed)
Encounter addended by: Delynn Flavin, RN on: 07/16/2012  8:17 PM<BR>     Documentation filed: Charges VN

## 2012-07-17 ENCOUNTER — Ambulatory Visit
Admission: RE | Admit: 2012-07-17 | Discharge: 2012-07-17 | Disposition: A | Discharge status: Home / Self Care | Payer: Medicaid Other | Source: Ambulatory Visit | Attending: Radiation Oncology | Admitting: Radiation Oncology

## 2012-07-18 ENCOUNTER — Ambulatory Visit
Admission: RE | Admit: 2012-07-18 | Discharge: 2012-07-18 | Disposition: A | Discharge status: Home / Self Care | Payer: Medicaid Other | Source: Ambulatory Visit | Attending: Radiation Oncology | Admitting: Radiation Oncology

## 2012-07-19 ENCOUNTER — Ambulatory Visit
Admission: RE | Admit: 2012-07-19 | Discharge: 2012-07-19 | Disposition: A | Discharge status: Home / Self Care | Payer: Medicaid Other | Source: Ambulatory Visit | Attending: Radiation Oncology | Admitting: Radiation Oncology

## 2012-07-20 ENCOUNTER — Encounter: Payer: Self-pay | Admitting: Radiation Oncology

## 2012-07-20 ENCOUNTER — Ambulatory Visit
Admission: RE | Admit: 2012-07-20 | Discharge: 2012-07-20 | Disposition: A | Discharge status: Home / Self Care | Payer: Medicaid Other | Source: Ambulatory Visit | Attending: Radiation Oncology | Admitting: Radiation Oncology

## 2012-07-20 VITALS — BP 158/86 | HR 77 | Temp 98.0°F | Wt 175.0 lb

## 2012-07-20 DIAGNOSIS — C50419 Malignant neoplasm of upper-outer quadrant of unspecified female breast: Secondary | ICD-10-CM

## 2012-07-20 NOTE — Progress Notes (Signed)
10/28 fractions to left breast and Supraclavicular region.  Skin left chest wall intact with faint erythema.  Denies any pain.

## 2012-07-20 NOTE — Progress Notes (Signed)
   Department of Radiation Oncology  Phone:  [phone] Fax:        [phone]  Weekly Treatment Note    Name: [name] Riley Date: 07/20/2012 MRN: [id number] DOB: 1951-08-24   Current dose: 18 Gy  Current fraction: 10   MEDICATIONS: Current Outpatient Prescriptions  Medication Sig Dispense Refill  . aspirin 81 MG tablet Take 81 mg by mouth daily.        . hyaluronate sodium (RADIAPLEXRX) GEL Apply 1 application topically 2 (two) times daily. Apply to skin after rad tx and bedtime, not  4 hours prior to rad txs      . levothyroxine (SYNTHROID, LEVOTHROID) 75 MCG tablet Take 75 mcg by mouth daily.       Marland Kitchen lidocaine-prilocaine (EMLA) cream Apply topically as needed. APPLY TO PAC 1-2 HOURS PRIOR TO ACCESS  30 g  1  . LORazepam (ATIVAN) 0.5 MG tablet Take 1 tablet (0.5 mg total) by mouth every 6 (six) hours as needed (Nausea or vomiting).  90 tablet  0  . metoprolol (TOPROL-XL) 100 MG 24 hr tablet Take 100 mg by mouth daily.       . non-metallic deodorant Thornton Papas) MISC Apply 1 application topically daily as needed. Apply after rad and prn,but not before 4 hours prior to rad txs      . Multiple Vitamins-Minerals (MULTIVITAMIN WITH MINERALS) tablet Take 1 tablet by mouth daily.         No current facility-administered medications for this encounter.   Facility-Administered Medications Ordered in Other Encounters  Medication Dose Route Frequency Provider Last Rate Last Dose  . lidocaine-prilocaine (EMLA) cream   Topical Once Pierce Crane, MD         ALLERGIES: Contrast media and Food   LABORATORY DATA:  Lab Results  Component Value Date   WBC 22.5* 06/15/2012   HGB 11.3* 06/15/2012   HCT 33.6* 06/15/2012   MCV 93.3 06/15/2012   PLT 311 Large & giant platelets 06/15/2012   Lab Results  Component Value Date   NA 140 05/17/2012   K 3.6 05/17/2012   CL 105 05/17/2012   CO2 27 05/17/2012   Lab Results  Component Value Date   ALT 28 05/17/2012   AST 23 05/17/2012   ALKPHOS 88 05/17/2012     BILITOT 0.4 05/17/2012     NARRATIVE: [name] Riley was seen today for weekly treatment management. The chart was checked and the patient's films were reviewed. The patient is doing very well. She has noticed only a little bit of irritation in the treatment area. She has had some nail changes although these have been improving.  PHYSICAL EXAMINATION: weight is 175 lb (79.379 kg). Her temperature is 98 F (36.7 C). Her blood pressure is 158/86 and her pulse is 77.      mild erythema present diffusely in the treatment area. No desquamation.  ASSESSMENT: The patient is doing satisfactorily with treatment.  PLAN: We will continue with the patient's radiation treatment as planned.

## 2012-07-23 ENCOUNTER — Encounter (INDEPENDENT_AMBULATORY_CARE_PROVIDER_SITE_OTHER): Payer: Self-pay | Admitting: Surgery

## 2012-07-23 ENCOUNTER — Ambulatory Visit
Admission: RE | Admit: 2012-07-23 | Discharge: 2012-07-23 | Disposition: A | Discharge status: Home / Self Care | Payer: Medicaid Other | Source: Ambulatory Visit | Attending: Radiation Oncology | Admitting: Radiation Oncology

## 2012-07-23 ENCOUNTER — Ambulatory Visit (INDEPENDENT_AMBULATORY_CARE_PROVIDER_SITE_OTHER): Payer: Medicaid Other | Admitting: Surgery

## 2012-07-23 VITALS — BP 139/87 | HR 80 | Temp 98.4°F | Resp 16 | Ht 68.0 in | Wt 175.8 lb

## 2012-07-23 DIAGNOSIS — C50419 Malignant neoplasm of upper-outer quadrant of unspecified female breast: Secondary | ICD-10-CM

## 2012-07-23 NOTE — Progress Notes (Signed)
Followup after right simple mastectomy left modified radical mastectomy and right Port-A-Cath insertion 11/14/2011. She has completed her course of chemotherapy and is currently undergoing radiation therapy. Overall she is doing well. She has good energy and feels well. She has some problems with her fingernails secondary to the chemotherapy. She will speak with Dr. Renelda Loma office about this.  On examination the mastectomy incisions are both well healed and flat. There is some skin dryness. No lymphadenopathy on either side. She has some numbness in the left chest wall and the medial part of her left upper arm as would be expected.  Impression status post right mastectomy for prophylaxis and left modified radical mastectomy currently undergoing radiation therapy after chemotherapy. Doing well. Incisions are well-healed with no sign of recurrent nodules. We will wait until she has completed her radiation therapy before considering port removal. We will wait to hear from Dr. Donnie Coffin before scheduling this procedure. I did discuss the procedure with the patient. The surgical procedure has been discussed with the patient.  Potential risks, benefits, alternative treatments, and expected outcomes have been explained.  All of the patient's questions at this time have been answered.  The likelihood of reaching the patient's treatment goal is good.  The patient understand the proposed surgical procedure and wishes to proceed.    Otherwise we will see her again in 6 months.  [name] Riley. [name], MD, [facility]  07/23/2012 11:16 AM

## 2012-07-24 ENCOUNTER — Ambulatory Visit
Admission: RE | Admit: 2012-07-24 | Discharge: 2012-07-24 | Disposition: A | Discharge status: Home / Self Care | Payer: Medicaid Other | Source: Ambulatory Visit | Attending: Radiation Oncology | Admitting: Radiation Oncology

## 2012-07-25 ENCOUNTER — Ambulatory Visit
Admission: RE | Admit: 2012-07-25 | Discharge: 2012-07-25 | Disposition: A | Discharge status: Home / Self Care | Payer: Medicaid Other | Source: Ambulatory Visit | Attending: Radiation Oncology | Admitting: Radiation Oncology

## 2012-07-26 ENCOUNTER — Ambulatory Visit
Admission: RE | Admit: 2012-07-26 | Discharge: 2012-07-26 | Disposition: A | Discharge status: Home / Self Care | Payer: Medicaid Other | Source: Ambulatory Visit | Attending: Radiation Oncology | Admitting: Radiation Oncology

## 2012-07-27 ENCOUNTER — Ambulatory Visit
Admission: RE | Admit: 2012-07-27 | Discharge: 2012-07-27 | Disposition: A | Discharge status: Home / Self Care | Payer: Medicaid Other | Source: Ambulatory Visit | Attending: Radiation Oncology | Admitting: Radiation Oncology

## 2012-07-27 VITALS — BP 159/85 | HR 77 | Temp 98.1°F | Wt 175.0 lb

## 2012-07-27 DIAGNOSIS — L589 Radiodermatitis, unspecified: Secondary | ICD-10-CM | POA: Insufficient documentation

## 2012-07-27 DIAGNOSIS — C50419 Malignant neoplasm of upper-outer quadrant of unspecified female breast: Secondary | ICD-10-CM

## 2012-07-27 NOTE — Progress Notes (Signed)
   Department of Radiation Oncology  Phone:  [phone] Fax:        [phone]  Weekly Treatment Note    Name: [name] Riley Date: 07/27/2012 MRN: [id number] DOB: 06-Feb-1951   Current dose: 27 Gy  Current fraction: 15   MEDICATIONS: Current Outpatient Prescriptions  Medication Sig Dispense Refill  . aspirin 81 MG tablet Take 81 mg by mouth daily.        . hyaluronate sodium (RADIAPLEXRX) GEL Apply 1 application topically 2 (two) times daily. Apply to skin after rad tx and bedtime, not  4 hours prior to rad txs      . levothyroxine (SYNTHROID, LEVOTHROID) 75 MCG tablet Take 75 mcg by mouth daily.       Marland Kitchen LORazepam (ATIVAN) 0.5 MG tablet Take 1 tablet (0.5 mg total) by mouth every 6 (six) hours as needed (Nausea or vomiting).  90 tablet  0  . metoprolol (TOPROL-XL) 100 MG 24 hr tablet Take 100 mg by mouth daily.       . Multiple Vitamins-Minerals (MULTIVITAMIN WITH MINERALS) tablet Take 1 tablet by mouth daily.        . non-metallic deodorant Thornton Papas) MISC Apply 1 application topically daily as needed. Apply after rad and prn,but not before 4 hours prior to rad txs       No current facility-administered medications for this encounter.   Facility-Administered Medications Ordered in Other Encounters  Medication Dose Route Frequency Provider Last Rate Last Dose  . lidocaine-prilocaine (EMLA) cream   Topical Once Pierce Crane, MD         ALLERGIES: Contrast media and Food   LABORATORY DATA:  Lab Results  Component Value Date   WBC 22.5* 06/15/2012   HGB 11.3* 06/15/2012   HCT 33.6* 06/15/2012   MCV 93.3 06/15/2012   PLT 311 Large & giant platelets 06/15/2012   Lab Results  Component Value Date   NA 140 05/17/2012   K 3.6 05/17/2012   CL 105 05/17/2012   CO2 27 05/17/2012   Lab Results  Component Value Date   ALT 28 05/17/2012   AST 23 05/17/2012   ALKPHOS 88 05/17/2012   BILITOT 0.4 05/17/2012     NARRATIVE: [name] Riley was seen today for weekly treatment management. The  chart was checked and the patient's films were reviewed. The patient is experiencing some itching in the upper/inner aspect of the treatment area. Otherwise doing well.  PHYSICAL EXAMINATION: weight is 175 lb (79.379 kg). Her temperature is 98.1 F (36.7 C). Her blood pressure is 159/85 and her pulse is 77.      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 Overall her skin looks fairly good for this part of her treatment.  ASSESSMENT: The patient is doing satisfactorily with treatment.  PLAN: We will continue with the patient's radiation treatment as planned. The patient will begin hydrocortisone cream and continue using radioplex skin cream.

## 2012-07-27 NOTE — Progress Notes (Signed)
Patient here for routine under treat visit for left breast radiation.Has follicular rash and will try hydrocortisone 1% if not improved will ask to be seen next week.Denies fatigue.Has generalized aches and pain.

## 2012-07-30 ENCOUNTER — Ambulatory Visit
Admission: RE | Admit: 2012-07-30 | Discharge: 2012-07-30 | Disposition: A | Discharge status: Home / Self Care | Payer: Medicaid Other | Source: Ambulatory Visit | Attending: Radiation Oncology | Admitting: Radiation Oncology

## 2012-07-30 DIAGNOSIS — C50419 Malignant neoplasm of upper-outer quadrant of unspecified female breast: Secondary | ICD-10-CM

## 2012-07-30 MED ORDER — BIAFINE EX EMUL
CUTANEOUS | Status: DC | PRN
  Administered 2012-07-30: 1 via TOPICAL

## 2012-07-31 ENCOUNTER — Ambulatory Visit
Admission: RE | Admit: 2012-07-31 | Discharge: 2012-07-31 | Disposition: A | Discharge status: Home / Self Care | Payer: Medicaid Other | Source: Ambulatory Visit | Attending: Radiation Oncology | Admitting: Radiation Oncology

## 2012-08-01 ENCOUNTER — Ambulatory Visit
Admission: RE | Admit: 2012-08-01 | Discharge: 2012-08-01 | Disposition: A | Discharge status: Home / Self Care | Payer: Medicaid Other | Source: Ambulatory Visit | Attending: Radiation Oncology | Admitting: Radiation Oncology

## 2012-08-02 ENCOUNTER — Ambulatory Visit
Admission: RE | Admit: 2012-08-02 | Discharge: 2012-08-02 | Disposition: A | Discharge status: Home / Self Care | Payer: Medicaid Other | Source: Ambulatory Visit | Attending: Radiation Oncology | Admitting: Radiation Oncology

## 2012-08-03 ENCOUNTER — Encounter: Payer: Self-pay | Admitting: Radiation Oncology

## 2012-08-03 ENCOUNTER — Ambulatory Visit
Admission: RE | Admit: 2012-08-03 | Discharge: 2012-08-03 | Disposition: A | Discharge status: Home / Self Care | Payer: Medicaid Other | Source: Ambulatory Visit | Attending: Radiation Oncology | Admitting: Radiation Oncology

## 2012-08-03 VITALS — BP 151/75 | HR 95 | Temp 98.0°F | Resp 20 | Wt 173.8 lb

## 2012-08-03 DIAGNOSIS — L589 Radiodermatitis, unspecified: Secondary | ICD-10-CM

## 2012-08-03 DIAGNOSIS — C50912 Malignant neoplasm of unspecified site of left female breast: Secondary | ICD-10-CM

## 2012-08-03 NOTE — Progress Notes (Signed)
Patient here weekly rad txs, left subclavian, ,20/28 completed, and  10/14 left chest wall completed   Patient alert,oriented x3, erythema and dermatitis on left chest wall, using biafine cream since this past Monday for increased itching, hydrocortisone cream didn't help, occasional twinges, still numbness under axilla, no fatigue stated,eating and drinking well  4:29 PM

## 2012-08-03 NOTE — Progress Notes (Signed)
  Radiation Oncology         [phone] ________________________________  Name: [name] Riley MRN: [id number]  Date: 08/03/2012  DOB: January 21, 1951  Weekly Radiation Therapy Management  Current Dose: 36 Gy     Planned Dose:  60.4 Gy  Narrative . . . . . . . . The patient presents for routine under treatment assessment.                                                     The patient is without complaint.                                 Set-up films were reviewed.                                 The chart was checked. Physical Findings. . .  weight is 173 lb 12.8 oz (78.835 kg). Her oral temperature is 98 F (36.7 C). Her blood pressure is 151/75 and her pulse is 95. Her respiration is 20. .Brisk erythema, more follicular reaction in solar exposed portion of chest wall.  The erythema is shiny and erysepeloid. Weight essentially stable.  No significant changes. Impression . . . . . . . The patient is  tolerating radiation. Plan . . . . . . . . . . . . Continue treatment as planned.  Given topical nystatin powder for empiric cutaneous candidiasis.   ________________________________  Artist Pais Kathrynn Running, M.D.

## 2012-08-06 ENCOUNTER — Ambulatory Visit
Admission: RE | Admit: 2012-08-06 | Discharge: 2012-08-06 | Disposition: A | Discharge status: Home / Self Care | Payer: Medicaid Other | Source: Ambulatory Visit | Attending: Radiation Oncology | Admitting: Radiation Oncology

## 2012-08-07 ENCOUNTER — Ambulatory Visit
Admission: RE | Admit: 2012-08-07 | Discharge: 2012-08-07 | Disposition: A | Discharge status: Home / Self Care | Payer: Medicaid Other | Source: Ambulatory Visit | Attending: Radiation Oncology | Admitting: Radiation Oncology

## 2012-08-08 ENCOUNTER — Ambulatory Visit: Payer: Medicaid Other

## 2012-08-08 ENCOUNTER — Ambulatory Visit
Admission: RE | Admit: 2012-08-08 | Discharge: 2012-08-08 | Disposition: A | Discharge status: Home / Self Care | Payer: Medicaid Other | Source: Ambulatory Visit | Attending: Radiation Oncology | Admitting: Radiation Oncology

## 2012-08-09 ENCOUNTER — Ambulatory Visit: Payer: Medicaid Other

## 2012-08-09 ENCOUNTER — Ambulatory Visit
Admission: RE | Admit: 2012-08-09 | Discharge: 2012-08-09 | Disposition: A | Discharge status: Home / Self Care | Payer: Medicaid Other | Source: Ambulatory Visit | Attending: Radiation Oncology | Admitting: Radiation Oncology

## 2012-08-09 ENCOUNTER — Other Ambulatory Visit: Payer: Self-pay | Admitting: *Deleted

## 2012-08-09 DIAGNOSIS — C50919 Malignant neoplasm of unspecified site of unspecified female breast: Secondary | ICD-10-CM

## 2012-08-10 ENCOUNTER — Ambulatory Visit (HOSPITAL_BASED_OUTPATIENT_CLINIC_OR_DEPARTMENT_OTHER): Payer: Medicaid Other | Admitting: Oncology

## 2012-08-10 ENCOUNTER — Other Ambulatory Visit: Payer: Self-pay | Admitting: *Deleted

## 2012-08-10 ENCOUNTER — Ambulatory Visit
Admission: RE | Admit: 2012-08-10 | Discharge: 2012-08-10 | Disposition: A | Discharge status: Home / Self Care | Payer: Medicaid Other | Source: Ambulatory Visit | Attending: Radiation Oncology | Admitting: Radiation Oncology

## 2012-08-10 ENCOUNTER — Other Ambulatory Visit (HOSPITAL_BASED_OUTPATIENT_CLINIC_OR_DEPARTMENT_OTHER): Payer: Medicaid Other | Admitting: Lab

## 2012-08-10 ENCOUNTER — Encounter: Payer: Self-pay | Admitting: Radiation Oncology

## 2012-08-10 ENCOUNTER — Ambulatory Visit: Payer: Medicaid Other

## 2012-08-10 VITALS — BP 156/88 | HR 85 | Temp 98.0°F | Resp 20 | Ht 68.0 in | Wt 174.3 lb

## 2012-08-10 VITALS — BP 144/87 | HR 72 | Temp 97.4°F | Wt 175.5 lb

## 2012-08-10 DIAGNOSIS — L608 Other nail disorders: Secondary | ICD-10-CM

## 2012-08-10 DIAGNOSIS — C50912 Malignant neoplasm of unspecified site of left female breast: Secondary | ICD-10-CM

## 2012-08-10 DIAGNOSIS — G4700 Insomnia, unspecified: Secondary | ICD-10-CM

## 2012-08-10 DIAGNOSIS — C50419 Malignant neoplasm of upper-outer quadrant of unspecified female breast: Secondary | ICD-10-CM

## 2012-08-10 DIAGNOSIS — Z170 Estrogen receptor positive status [ER+]: Secondary | ICD-10-CM

## 2012-08-10 DIAGNOSIS — E559 Vitamin D deficiency, unspecified: Secondary | ICD-10-CM

## 2012-08-10 DIAGNOSIS — C50919 Malignant neoplasm of unspecified site of unspecified female breast: Secondary | ICD-10-CM

## 2012-08-10 LAB — CBC WITH DIFFERENTIAL/PLATELET
BASO%: 0.6 % (ref 0.0–2.0)
Basophils Absolute: 0 10*3/uL (ref 0.0–0.1)
EOS%: 5 % (ref 0.0–7.0)
Eosinophils Absolute: 0.2 10*3/uL (ref 0.0–0.5)
HCT: 39.2 % (ref 34.8–46.6)
HGB: 13.4 g/dL (ref 11.6–15.9)
LYMPH%: 21.1 % (ref 14.0–49.7)
MCH: 32.1 pg (ref 25.1–34.0)
MCHC: 34.3 g/dL (ref 31.5–36.0)
MCV: 93.7 fL (ref 79.5–101.0)
MONO#: 0.4 10*3/uL (ref 0.1–0.9)
MONO%: 11.3 % (ref 0.0–14.0)
NEUT#: 2.4 10*3/uL (ref 1.5–6.5)
NEUT%: 62 % (ref 38.4–76.8)
Platelets: 277 10*3/uL (ref 145–400)
RBC: 4.19 10*6/uL (ref 3.70–5.45)
RDW: 13.7 % (ref 11.2–14.5)
WBC: 3.8 10*3/uL — ABNORMAL LOW (ref 3.9–10.3)
lymph#: 0.8 10*3/uL — ABNORMAL LOW (ref 0.9–3.3)

## 2012-08-10 LAB — COMPREHENSIVE METABOLIC PANEL (CC13)
ALT: 20 U/L (ref 0–55)
AST: 19 U/L (ref 5–34)
Albumin: 4.4 g/dL (ref 3.5–5.0)
Alkaline Phosphatase: 140 U/L (ref 40–150)
BUN: 14 mg/dL (ref 7.0–26.0)
CO2: 26 mEq/L (ref 22–29)
Calcium: 10.3 mg/dL (ref 8.4–10.4)
Chloride: 105 mEq/L (ref 98–107)
Creatinine: 0.6 mg/dL (ref 0.6–1.1)
Glucose: 81 mg/dl (ref 70–99)
Potassium: 3.5 mEq/L (ref 3.5–5.1)
Sodium: 142 mEq/L (ref 136–145)
Total Bilirubin: 0.6 mg/dL (ref 0.20–1.20)
Total Protein: 7.3 g/dL (ref 6.4–8.3)

## 2012-08-10 LAB — CANCER ANTIGEN 27.29: CA 27.29: 48 U/mL — ABNORMAL HIGH (ref 0–39)

## 2012-08-10 LAB — LACTATE DEHYDROGENASE (CC13): LDH: 215 U/L (ref 125–220)

## 2012-08-10 MED ORDER — LORAZEPAM 0.5 MG PO TABS: 0.5000 mg | ORAL_TABLET | Freq: Four times a day (QID) | ORAL | Status: AC | PRN

## 2012-08-10 MED ORDER — ANASTROZOLE 1 MG PO TABS: 1.0000 mg | ORAL_TABLET | Freq: Every day | ORAL | Status: DC

## 2012-08-10 NOTE — Telephone Encounter (Signed)
gve the pt her nov bone density appt at [facility] along with the jan 2014 appt calendar

## 2012-08-10 NOTE — Progress Notes (Signed)
   Department of Radiation Oncology  Phone:  [phone] Fax:        [phone]  Weekly Treatment Note    Name: [name] Riley Date: 08/10/2012 MRN: [id number] DOB: 09-06-1951   Current dose: 45 Gy  Current fraction: 25   MEDICATIONS: Current Outpatient Prescriptions  Medication Sig Dispense Refill  . aspirin 81 MG tablet Take 81 mg by mouth daily.        Marland Kitchen emollient (BIAFINE) cream Apply 1 application topically daily. Apply to afftected skin area after rad txs and bedtime,prn, not 4 hours prior to rad tx      . levothyroxine (SYNTHROID, LEVOTHROID) 75 MCG tablet Take 75 mcg by mouth daily.       Marland Kitchen LORazepam (ATIVAN) 0.5 MG tablet Take 1 tablet (0.5 mg total) by mouth every 6 (six) hours as needed for anxiety (Nausea or vomiting).  90 tablet  0  . metoprolol (TOPROL-XL) 100 MG 24 hr tablet Take 100 mg by mouth daily.       . Multiple Vitamins-Minerals (MULTIVITAMIN WITH MINERALS) tablet Take 1 tablet by mouth daily.        . non-metallic deodorant Thornton Papas) MISC Apply 1 application topically daily as needed. Apply after rad and prn,but not before 4 hours prior to rad txs      . anastrozole (ARIMIDEX) 1 MG tablet Take 1 tablet (1 mg total) by mouth daily.  30 tablet  4   No current facility-administered medications for this encounter.   Facility-Administered Medications Ordered in Other Encounters  Medication Dose Route Frequency Provider Last Rate Last Dose  . lidocaine-prilocaine (EMLA) cream   Topical Once Pierce Crane, MD         ALLERGIES: Contrast media and Food   LABORATORY DATA:  Lab Results  Component Value Date   WBC 3.8* 08/10/2012   HGB 13.4 08/10/2012   HCT 39.2 08/10/2012   MCV 93.7 08/10/2012   PLT 277 08/10/2012   Lab Results  Component Value Date   NA 142 08/10/2012   K 3.5 08/10/2012   CL 105 08/10/2012   CO2 26 08/10/2012   Lab Results  Component Value Date   ALT 20 08/10/2012   AST 19 08/10/2012   ALKPHOS 140 08/10/2012   BILITOT 0.60  08/10/2012     NARRATIVE: [name] Riley was seen today for weekly treatment management. The chart was checked and the patient's films were reviewed. The patient states that she began using antifungal powder, miconazole, to the upper in her portion of the breast and this seems to help quite a bit. She is notice some increased skin irritation posteriorly in the left shoulder region. Otherwise doing well.  PHYSICAL EXAMINATION: weight is 175 lb 8 oz (79.606 kg). Her temperature is 97.4 F (36.3 C). Her blood pressure is 144/87 and her pulse is 72.      the upper-inner portion is the area most affected so far which is crusted over. Radiation-induced changes/dermatitis present. A radiation-induced rash is present posteriorly in the area described. Diffusely, more inferiorly in the chest area of the skin looks quite good with moderate changes and no desquamation.  ASSESSMENT: The patient is doing satisfactorily with treatment.  PLAN: We will continue with the patient's radiation treatment as planned. Please with how she is doing. I would not make any changes at this point. She will begin her boost treatment after 3 more fractions.

## 2012-08-10 NOTE — Patient Instructions (Addendum)
We will start anastrazole/arimidex  Side effects include; bone /joint pain and/or hot flashes

## 2012-08-10 NOTE — Progress Notes (Signed)
Hematology and Oncology Follow Up Visit  [name] Riley [id number] [birth year] 61 y.o. 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 Right simple mastectomy was clear. She has completed 4/4 cycles of every 3 week FEC, and 4 planned adjuvant q3wk Taxotere with Neulasta support on day 2 she still in it receiving radiation therapy to complete his 08/22/2012.     Interim History:   [name] Riley is seen today for followup. She will be completing radiation in the next 2 weeks. She is working full-time. She feels well has no complaints. Her nails have fallen off of that she does not have any pain or evidence of infection. Her left breast has some areas of excoriation and she is using various creams for this.  Medications:   I have reviewed the patient's current medications.  Allergies:  Allergies  Allergen Reactions  . Contrast Media (Iodinated Diagnostic Agents) Shortness Of Breath and Rash  . Food Shortness Of Breath    Cantaloupe    Physical Exam: Filed Vitals:   08/10/12 0923  BP: 156/88  Pulse: 85  Temp: 98 F (36.7 C)  Resp: 20    Body mass index is 26.50 kg/(m^2). Weight: 172 lbs. HEENT:  Sclerae anicteric, conjunctivae pink.  Oropharynx is basically clear, on the right side of the tongue there are 2 small lesions representative of oral mucositis without obvious evidence of superinfection and/or herpetic infection.  Nodes:  No cervical, supraclavicular, or axillary lymphadenopathy palpated.  Breast Exam:  Bilateral mastectomy incisions are well healed. No redness of the skin or subcutaneous nodules. Left chest wall area has some  desquamated dry skin. Lungs:  Clear to auscultation bilaterally.  No crackles, rhonchi, or wheezes.   Heart:  Regular rate and rhythm.   Abdomen:  Soft, nontender.  Positive bowel sounds.  No organomegaly or masses palpated.   Musculoskeletal:  No focal spinal tenderness to palpation.  Extremities:  Benign.  No peripheral edema or cyanosis, no evidence of lymphedema of the left upper extremity at this time.  Skin:  Benign, except for some flushing.  No PPE changes. Neuro:  Nonfocal, alert and oriented x 3.   Lab Results: Lab Results  Component Value Date   WBC 3.8* 08/10/2012   HGB 13.4 08/10/2012   HCT 39.2 08/10/2012   MCV 93.7 08/10/2012   PLT 277 08/10/2012   NEUTROABS 2.4 08/10/2012     Chemistry      Component Value Date/Time   NA 140 05/17/2012 1006   K 3.6 05/17/2012 1006   CL 105 05/17/2012 1006   CO2 27 05/17/2012 1006   BUN 19 05/17/2012 1006   CREATININE 0.63 05/17/2012 1006      Component Value Date/Time   CALCIUM 10.2 05/17/2012 1006   ALKPHOS 88 05/17/2012 1006   AST 23 05/17/2012 1006   ALT 28 05/17/2012 1006   BILITOT 0.4 05/17/2012 1006      Lab Results  Component Value Date   [id number] 36 12/29/2011   Assessment:  1. Left breast cancer, completed chemo and surgery. Currently on radiation, overall doing well   Plan:  We discussed adjuvant hormonal therapy as she is ER/PR positive. I recommended an anastrozole.  I discussed side effects with her and have recommended that she start this after she completes radiation. I sent a prescription to her pharmacy. We will discuss and recommend that she have her port removed after radiation. 7 difficulty sleeping I've given her prescription for Ativan. I discussed management of her nail changes and loss of nails themselves. We will go ahead and get a bone density test before she returns. I also recommend vitamin D supplementation.  30 minutes spent with the patient half the time and patient-related counseling.      Trinita Devlin,MD

## 2012-08-10 NOTE — Progress Notes (Signed)
[name] Riley in for Weekly assessment.  She has been using Miconazole powder to the upper inner portion of her treatment field since last week.  Bright erythema in this area with rash like appearance and bright erythema also noted on the left upper shoulder and left upper neck regions and C/o itching in the treatment area today.   Denies any pain today.  Continues to have numbness in her tx. Field.

## 2012-08-13 ENCOUNTER — Ambulatory Visit: Payer: Medicaid Other

## 2012-08-13 ENCOUNTER — Other Ambulatory Visit (INDEPENDENT_AMBULATORY_CARE_PROVIDER_SITE_OTHER): Payer: Self-pay | Admitting: Surgery

## 2012-08-13 ENCOUNTER — Ambulatory Visit
Admission: RE | Admit: 2012-08-13 | Discharge: 2012-08-13 | Disposition: A | Discharge status: Home / Self Care | Payer: Medicaid Other | Source: Ambulatory Visit | Attending: Radiation Oncology | Admitting: Radiation Oncology

## 2012-08-13 ENCOUNTER — Encounter: Payer: Self-pay | Admitting: Radiation Oncology

## 2012-08-14 ENCOUNTER — Ambulatory Visit: Payer: Medicaid Other

## 2012-08-14 ENCOUNTER — Ambulatory Visit
Admission: RE | Admit: 2012-08-14 | Discharge: 2012-08-14 | Disposition: A | Discharge status: Home / Self Care | Payer: Medicaid Other | Source: Ambulatory Visit | Attending: Radiation Oncology | Admitting: Radiation Oncology

## 2012-08-15 ENCOUNTER — Ambulatory Visit
Admission: RE | Admit: 2012-08-15 | Discharge: 2012-08-15 | Disposition: A | Discharge status: Home / Self Care | Payer: Medicaid Other | Source: Ambulatory Visit | Attending: Radiation Oncology | Admitting: Radiation Oncology

## 2012-08-15 ENCOUNTER — Ambulatory Visit: Payer: Medicaid Other

## 2012-08-16 ENCOUNTER — Ambulatory Visit: Payer: Medicaid Other

## 2012-08-16 ENCOUNTER — Ambulatory Visit
Admission: RE | Admit: 2012-08-16 | Discharge: 2012-08-16 | Disposition: A | Discharge status: Home / Self Care | Payer: Medicaid Other | Source: Ambulatory Visit | Attending: Radiation Oncology | Admitting: Radiation Oncology

## 2012-08-17 ENCOUNTER — Ambulatory Visit
Admission: RE | Admit: 2012-08-17 | Discharge: 2012-08-17 | Disposition: A | Discharge status: Home / Self Care | Payer: Medicaid Other | Source: Ambulatory Visit | Attending: Radiation Oncology | Admitting: Radiation Oncology

## 2012-08-17 ENCOUNTER — Encounter: Payer: Self-pay | Admitting: Radiation Oncology

## 2012-08-17 ENCOUNTER — Ambulatory Visit: Payer: Medicaid Other

## 2012-08-17 VITALS — BP 144/84 | HR 74 | Resp 18 | Wt 174.3 lb

## 2012-08-17 DIAGNOSIS — C50419 Malignant neoplasm of upper-outer quadrant of unspecified female breast: Secondary | ICD-10-CM

## 2012-08-17 NOTE — Progress Notes (Signed)
   Department of Radiation Oncology  Phone:  [phone] Fax:        [phone]  Weekly Treatment Note    Name: [name] Riley Date: 08/17/2012 MRN: [id number] DOB: 01-07-1951   Current dose: 54.4 Gy  Current fraction: 30   MEDICATIONS: Current Outpatient Prescriptions  Medication Sig Dispense Refill  . anastrozole (ARIMIDEX) 1 MG tablet Take 1 tablet (1 mg total) by mouth daily.  30 tablet  4  . aspirin 81 MG tablet Take 81 mg by mouth daily.        Marland Kitchen emollient (BIAFINE) cream Apply 1 application topically daily. Apply to afftected skin area after rad txs and bedtime,prn, not 4 hours prior to rad tx      . levothyroxine (SYNTHROID, LEVOTHROID) 75 MCG tablet Take 75 mcg by mouth daily.       Marland Kitchen LORazepam (ATIVAN) 0.5 MG tablet Take 1 tablet (0.5 mg total) by mouth every 6 (six) hours as needed for anxiety (Nausea or vomiting).  90 tablet  0  . metoprolol (TOPROL-XL) 100 MG 24 hr tablet Take 100 mg by mouth daily.       . Multiple Vitamins-Minerals (MULTIVITAMIN WITH MINERALS) tablet Take 1 tablet by mouth daily.        . non-metallic deodorant Thornton Papas) MISC Apply 1 application topically daily as needed. Apply after rad and prn,but not before 4 hours prior to rad txs       No current facility-administered medications for this encounter.   Facility-Administered Medications Ordered in Other Encounters  Medication Dose Route Frequency Provider Last Rate Last Dose  . lidocaine-prilocaine (EMLA) cream   Topical Once Pierce Crane, MD         ALLERGIES: Contrast media and Food   LABORATORY DATA:  Lab Results  Component Value Date   WBC 3.8* 08/10/2012   HGB 13.4 08/10/2012   HCT 39.2 08/10/2012   MCV 93.7 08/10/2012   PLT 277 08/10/2012   Lab Results  Component Value Date   NA 142 08/10/2012   K 3.5 08/10/2012   CL 105 08/10/2012   CO2 26 08/10/2012   Lab Results  Component Value Date   ALT 20 08/10/2012   AST 19 08/10/2012   ALKPHOS 140 08/10/2012   BILITOT 0.60  08/10/2012     NARRATIVE: [name] Riley was seen today for weekly treatment management. The chart was checked and the patient's films were reviewed. The patient states that she is doing fairly well. Some increased irritation in the high left axilla. The area more medially has improved some although still is irritated with some itching.  PHYSICAL EXAMINATION: weight is 174 lb 4.8 oz (79.062 kg). Her blood pressure is 144/84 and her pulse is 74. Her respiration is 18.      some diffuse radiation induced changes present. Dryness with some crusting over is present in the upper medial aspect. This does look a little bit better. Some increased hyperpigmentation is present in the axilla in the area which she has noticed more but without any desquamation. The boost site looks fairly good.  ASSESSMENT: The patient is doing satisfactorily with treatment.  PLAN: We will continue with the patient's radiation treatment as planned. The patient is doing better I believe with her nearing the completion of her treatment. She will continue her current skin care. The boost site looks good and I believe that the other areas are going to heal adequately.

## 2012-08-17 NOTE — Progress Notes (Signed)
Patient presents to the clinic today for PUT with Dr. Mitzi Hansen. Patient alert and oriented to person, place, and time. No distress noted. Steady gait noted. Pleasant affect noted. Patient denies pain at this time but, does reports discomfort under her left axilla related to skin changes from radiation therapy. Hyperpigmentation with dry desquamation of the center of the chest wall and left axilla. Patient reports using powder given by Dr. Mitzi Hansen and Biafine cream, Patient reports that powder has help drastically improve not only the appearance of her skin but, resolved the itching. Patient reports that she now has to sleep on her back because its too uncomfortable to sleep on her side. Patient reports energy level remains the same as prior to treatment. Reported all findings to Dr. Mitzi Hansen.

## 2012-08-20 ENCOUNTER — Ambulatory Visit: Payer: Medicaid Other

## 2012-08-20 ENCOUNTER — Ambulatory Visit
Admission: RE | Admit: 2012-08-20 | Discharge: 2012-08-20 | Disposition: A | Discharge status: Home / Self Care | Payer: Medicaid Other | Source: Ambulatory Visit | Attending: Radiation Oncology | Admitting: Radiation Oncology

## 2012-08-21 ENCOUNTER — Ambulatory Visit
Admission: RE | Admit: 2012-08-21 | Discharge: 2012-08-21 | Disposition: A | Discharge status: Home / Self Care | Payer: Medicaid Other | Source: Ambulatory Visit | Attending: Radiation Oncology | Admitting: Radiation Oncology

## 2012-08-21 ENCOUNTER — Ambulatory Visit: Payer: Medicaid Other

## 2012-08-22 ENCOUNTER — Ambulatory Visit
Admission: RE | Admit: 2012-08-22 | Discharge: 2012-08-22 | Disposition: A | Discharge status: Home / Self Care | Payer: Medicaid Other | Source: Ambulatory Visit | Attending: Radiation Oncology | Admitting: Radiation Oncology

## 2012-08-22 ENCOUNTER — Encounter: Payer: Self-pay | Admitting: Radiation Oncology

## 2012-08-22 ENCOUNTER — Ambulatory Visit: Payer: Medicaid Other

## 2012-08-22 ENCOUNTER — Ambulatory Visit
Admission: RE | Admit: 2012-08-22 | Discharge: 2012-08-22 | Disposition: A | Discharge status: Home / Self Care | Payer: Medicaid Other | Source: Ambulatory Visit | Attending: Oncology | Admitting: Oncology

## 2012-08-22 VITALS — BP 129/71 | HR 78 | Temp 98.1°F | Wt 174.2 lb

## 2012-08-22 DIAGNOSIS — E559 Vitamin D deficiency, unspecified: Secondary | ICD-10-CM

## 2012-08-22 DIAGNOSIS — C50419 Malignant neoplasm of upper-outer quadrant of unspecified female breast: Secondary | ICD-10-CM

## 2012-08-22 DIAGNOSIS — C50912 Malignant neoplasm of unspecified site of left female breast: Secondary | ICD-10-CM

## 2012-08-22 MED ORDER — BIAFINE EX EMUL
CUTANEOUS | Status: DC | PRN
  Administered 2012-08-22: 15:00:00 via TOPICAL

## 2012-08-22 NOTE — Progress Notes (Signed)
Completes treatment today to left chest wall and [place] region.  Note redness and tanning of these areas with dry desquamation in the axillary region.  The rash like area in the superior portion of her tx field has dried and the skin is scaly, but intact.  Grades pain as a Level 7  left axilla on a scale of 0-10.    Has increased redness in the area were where bra strap rubs and has telfa pads to place under the bra to decrease friction in this area. Note a small pending blister in this bra line area.   Given another tube of biafine.

## 2012-08-22 NOTE — Progress Notes (Signed)
   Department of Radiation Oncology  Phone:  [phone] Fax:        [phone]  Weekly Treatment Note    Name: [name] Riley Date: 08/22/2012 MRN: [id number] DOB: 12-17-1950   Current dose: 60.4 Gy  Current fraction: 33   MEDICATIONS: Current Outpatient Prescriptions  Medication Sig Dispense Refill  . aspirin 81 MG tablet Take 81 mg by mouth daily.        Marland Kitchen emollient (BIAFINE) cream Apply 1 application topically daily. Apply to afftected skin area after rad txs and bedtime,prn, not 4 hours prior to rad tx      . levothyroxine (SYNTHROID, LEVOTHROID) 75 MCG tablet Take 75 mcg by mouth daily.       Marland Kitchen LORazepam (ATIVAN) 0.5 MG tablet Take 1 tablet (0.5 mg total) by mouth every 6 (six) hours as needed for anxiety (Nausea or vomiting).  90 tablet  0  . metoprolol (TOPROL-XL) 100 MG 24 hr tablet Take 100 mg by mouth daily.       . Multiple Vitamins-Minerals (MULTIVITAMIN WITH MINERALS) tablet Take 1 tablet by mouth daily.        . non-metallic deodorant Thornton Papas) MISC Apply 1 application topically daily as needed. Apply after rad and prn,but not before 4 hours prior to rad txs      . anastrozole (ARIMIDEX) 1 MG tablet Take 1 tablet (1 mg total) by mouth daily.  30 tablet  4   Current Facility-Administered Medications  Medication Dose Route Frequency Provider Last Rate Last Dose  . topical emolient (BIAFINE) emulsion   Topical PRN Jonna Coup, MD       Facility-Administered Medications Ordered in Other Encounters  Medication Dose Route Frequency Provider Last Rate Last Dose  . lidocaine-prilocaine (EMLA) cream   Topical Once Pierce Crane, MD         ALLERGIES: Contrast media and Food   LABORATORY DATA:  Lab Results  Component Value Date   WBC 3.8* 08/10/2012   HGB 13.4 08/10/2012   HCT 39.2 08/10/2012   MCV 93.7 08/10/2012   PLT 277 08/10/2012   Lab Results  Component Value Date   NA 142 08/10/2012   K 3.5 08/10/2012   CL 105 08/10/2012   CO2 26 08/10/2012    Lab Results  Component Value Date   ALT 20 08/10/2012   AST 19 08/10/2012   ALKPHOS 140 08/10/2012   BILITOT 0.60 08/10/2012     NARRATIVE: [name] Riley was seen today for weekly treatment management. The chart was checked and the patient's films were reviewed. The patient is doing fairly well. She states that her skin has healed since she was last seen. Continue tightness in the left axilla.  PHYSICAL EXAMINATION: weight is 174 lb 3.2 oz (79.017 kg). Her temperature is 98.1 F (36.7 C). Her blood pressure is 129/71 and her pulse is 78.      dry desquamation remains in the upper medial aspect of the treatment area. No moist desquamation. Continued increased skin change in the upper axilla.  ASSESSMENT: The patient is did satisfactorily with treatment. She had some expected skin irritation.  PLAN: Followup in one month. The patient will continue her current skin care for approximately 2 weeks.

## 2012-08-27 NOTE — Progress Notes (Signed)
Coming in for pac out Lives in [place] Will need [address]

## 2012-08-29 NOTE — Addendum Note (Signed)
Encounter addended by: Delynn Flavin, RN on: 08/29/2012  7:49 PM<BR>     Documentation filed: Charges VN

## 2012-08-30 ENCOUNTER — Encounter (HOSPITAL_BASED_OUTPATIENT_CLINIC_OR_DEPARTMENT_OTHER): Payer: Self-pay | Admitting: *Deleted

## 2012-08-30 ENCOUNTER — Ambulatory Visit (HOSPITAL_BASED_OUTPATIENT_CLINIC_OR_DEPARTMENT_OTHER): Payer: Medicaid Other | Admitting: *Deleted

## 2012-08-30 ENCOUNTER — Ambulatory Visit (HOSPITAL_BASED_OUTPATIENT_CLINIC_OR_DEPARTMENT_OTHER)
Admission: RE | Admit: 2012-08-30 | Discharge: 2012-08-30 | Disposition: A | Discharge status: Home / Self Care | Payer: Medicaid Other | Source: Ambulatory Visit | Attending: Surgery | Admitting: Surgery

## 2012-08-30 ENCOUNTER — Encounter (HOSPITAL_BASED_OUTPATIENT_CLINIC_OR_DEPARTMENT_OTHER)
Admission: RE | Disposition: A | Discharge status: Home / Self Care | Payer: Self-pay | Source: Ambulatory Visit | Attending: Surgery

## 2012-08-30 ENCOUNTER — Encounter (HOSPITAL_BASED_OUTPATIENT_CLINIC_OR_DEPARTMENT_OTHER): Payer: Self-pay

## 2012-08-30 DIAGNOSIS — Z452 Encounter for adjustment and management of vascular access device: Secondary | ICD-10-CM | POA: Insufficient documentation

## 2012-08-30 DIAGNOSIS — E039 Hypothyroidism, unspecified: Secondary | ICD-10-CM | POA: Insufficient documentation

## 2012-08-30 DIAGNOSIS — I2510 Atherosclerotic heart disease of native coronary artery without angina pectoris: Secondary | ICD-10-CM | POA: Insufficient documentation

## 2012-08-30 DIAGNOSIS — Z79899 Other long term (current) drug therapy: Secondary | ICD-10-CM | POA: Insufficient documentation

## 2012-08-30 DIAGNOSIS — Z7982 Long term (current) use of aspirin: Secondary | ICD-10-CM | POA: Insufficient documentation

## 2012-08-30 DIAGNOSIS — I10 Essential (primary) hypertension: Secondary | ICD-10-CM | POA: Insufficient documentation

## 2012-08-30 DIAGNOSIS — I252 Old myocardial infarction: Secondary | ICD-10-CM | POA: Insufficient documentation

## 2012-08-30 DIAGNOSIS — C50919 Malignant neoplasm of unspecified site of unspecified female breast: Secondary | ICD-10-CM | POA: Insufficient documentation

## 2012-08-30 DIAGNOSIS — E785 Hyperlipidemia, unspecified: Secondary | ICD-10-CM | POA: Insufficient documentation

## 2012-08-30 DIAGNOSIS — Z9221 Personal history of antineoplastic chemotherapy: Secondary | ICD-10-CM | POA: Insufficient documentation

## 2012-08-30 HISTORY — PX: PORT-A-CATH REMOVAL: SHX5289

## 2012-08-30 LAB — POCT I-STAT, CHEM 8
BUN: 13 mg/dL (ref 6–23)
Calcium, Ion: 1.24 mmol/L — ABNORMAL HIGH (ref 1.12–1.23)
Chloride: 103 mEq/L (ref 96–112)
Creatinine, Ser: 0.7 mg/dL (ref 0.50–1.10)
Glucose, Bld: 97 mg/dL (ref 70–99)
HCT: 38 % (ref 36.0–46.0)
Hemoglobin: 12.9 g/dL (ref 12.0–15.0)
Potassium: 3.8 mEq/L (ref 3.5–5.1)
Sodium: 141 mEq/L (ref 135–145)
TCO2: 25 mmol/L (ref 0–100)

## 2012-08-30 SURGERY — REMOVAL PORT-A-CATH
Anesthesia: General | Site: Chest | Laterality: Right | Wound class: Clean

## 2012-08-30 MED ORDER — CHLORHEXIDINE GLUCONATE 4 % EX LIQD: 1.0000 "application " | Freq: Once | CUTANEOUS | Status: DC

## 2012-08-30 MED ORDER — LIDOCAINE HCL (CARDIAC) 20 MG/ML IV SOLN
INTRAVENOUS | Status: DC | PRN
  Administered 2012-08-30: 50 mg via INTRAVENOUS

## 2012-08-30 MED ORDER — ONDANSETRON HCL 4 MG/2ML IJ SOLN
INTRAMUSCULAR | Status: DC | PRN
  Administered 2012-08-30: 4 mg via INTRAVENOUS

## 2012-08-30 MED ORDER — DEXAMETHASONE SODIUM PHOSPHATE 4 MG/ML IJ SOLN
INTRAMUSCULAR | Status: DC | PRN
  Administered 2012-08-30: 10 mg via INTRAVENOUS

## 2012-08-30 MED ORDER — FENTANYL CITRATE 0.05 MG/ML IJ SOLN
INTRAMUSCULAR | Status: DC | PRN
  Administered 2012-08-30: 100 ug via INTRAVENOUS

## 2012-08-30 MED ORDER — LACTATED RINGERS IV SOLN
INTRAVENOUS | Status: DC
  Administered 2012-08-30: 10:00:00 via INTRAVENOUS

## 2012-08-30 MED ORDER — PROPOFOL 10 MG/ML IV BOLUS
INTRAVENOUS | Status: DC | PRN
  Administered 2012-08-30: 200 mg via INTRAVENOUS

## 2012-08-30 MED ORDER — CEFAZOLIN SODIUM-DEXTROSE 2-3 GM-% IV SOLR
2.0000 g | INTRAVENOUS | Status: AC
  Administered 2012-08-30: 2 g via INTRAVENOUS

## 2012-08-30 MED ORDER — BUPIVACAINE-EPINEPHRINE 0.25% -1:200000 IJ SOLN
INTRAMUSCULAR | Status: DC | PRN
  Administered 2012-08-30: 10 mL

## 2012-08-30 MED ORDER — MIDAZOLAM HCL 5 MG/5ML IJ SOLN
INTRAMUSCULAR | Status: DC | PRN
  Administered 2012-08-30: 2 mg via INTRAVENOUS

## 2012-08-30 MED ORDER — SCOPOLAMINE 1 MG/3DAYS TD PT72
1.0000 | MEDICATED_PATCH | TRANSDERMAL | Status: DC
  Administered 2012-08-30: 1.5 mg via TRANSDERMAL

## 2012-08-30 MED ORDER — HYDROCODONE-ACETAMINOPHEN 5-325 MG PO TABS: 1.0000 | ORAL_TABLET | ORAL | Status: DC | PRN

## 2012-08-30 MED ORDER — EPHEDRINE SULFATE 50 MG/ML IJ SOLN
INTRAMUSCULAR | Status: DC | PRN
  Administered 2012-08-30 (×2): 5 mg via INTRAVENOUS

## 2012-08-30 SURGICAL SUPPLY — 37 items
APPLICATOR COTTON TIP 6IN STRL (MISCELLANEOUS) IMPLANT
BENZOIN TINCTURE PRP APPL 2/3 (GAUZE/BANDAGES/DRESSINGS) ×2 IMPLANT
BLADE HEX COATED 2.75 (ELECTRODE) ×2 IMPLANT
BLADE SURG 15 STRL LF DISP TIS (BLADE) ×1 IMPLANT
BLADE SURG 15 STRL SS (BLADE) ×1
CANISTER SUCTION 1200CC (MISCELLANEOUS) IMPLANT
CHLORAPREP W/TINT 26ML (MISCELLANEOUS) ×2 IMPLANT
CLOTH BEACON ORANGE TIMEOUT ST (SAFETY) ×2 IMPLANT
COVER MAYO STAND STRL (DRAPES) ×2 IMPLANT
COVER TABLE BACK 60X90 (DRAPES) ×2 IMPLANT
DECANTER SPIKE VIAL GLASS SM (MISCELLANEOUS) ×2 IMPLANT
DRAPE PED LAPAROTOMY (DRAPES) ×2 IMPLANT
DRAPE UTILITY XL STRL (DRAPES) ×4 IMPLANT
DRSG TEGADERM 4X4.75 (GAUZE/BANDAGES/DRESSINGS) ×2 IMPLANT
ELECT REM PT RETURN 9FT ADLT (ELECTROSURGICAL) ×2
ELECTRODE REM PT RTRN 9FT ADLT (ELECTROSURGICAL) ×1 IMPLANT
GAUZE SPONGE 4X4 12PLY STRL LF (GAUZE/BANDAGES/DRESSINGS) IMPLANT
GLOVE BIO SURGEON STRL SZ7 (GLOVE) ×2 IMPLANT
GLOVE BIOGEL PI IND STRL 7.5 (GLOVE) ×1 IMPLANT
GLOVE BIOGEL PI INDICATOR 7.5 (GLOVE) ×1
GLOVE ECLIPSE 6.5 STRL STRAW (GLOVE) ×2 IMPLANT
GOWN PREVENTION PLUS XLARGE (GOWN DISPOSABLE) ×2 IMPLANT
NEEDLE HYPO 25X1 1.5 SAFETY (NEEDLE) ×2 IMPLANT
NS IRRIG 1000ML POUR BTL (IV SOLUTION) IMPLANT
PACK BASIN DAY SURGERY FS (CUSTOM PROCEDURE TRAY) ×2 IMPLANT
PENCIL BUTTON HOLSTER BLD 10FT (ELECTRODE) ×2 IMPLANT
SPONGE LAP 4X18 X RAY DECT (DISPOSABLE) ×2 IMPLANT
STRIP CLOSURE SKIN 1/2X4 (GAUZE/BANDAGES/DRESSINGS) ×2 IMPLANT
SUT MON AB 4-0 PC3 18 (SUTURE) ×2 IMPLANT
SUT VIC AB 3-0 SH 27 (SUTURE) ×1
SUT VIC AB 3-0 SH 27X BRD (SUTURE) ×1 IMPLANT
SYR CONTROL 10ML LL (SYRINGE) ×2 IMPLANT
TOWEL OR 17X24 6PK STRL BLUE (TOWEL DISPOSABLE) ×2 IMPLANT
TOWEL OR NON WOVEN STRL DISP B (DISPOSABLE) ×4 IMPLANT
TUBE CONNECTING 20X1/4 (TUBING) IMPLANT
WATER STERILE IRR 1000ML POUR (IV SOLUTION) ×2 IMPLANT
YANKAUER SUCT BULB TIP NO VENT (SUCTIONS) IMPLANT

## 2012-08-30 NOTE — Anesthesia Postprocedure Evaluation (Signed)
  Anesthesia Post-op Note  Patient: [name] Riley  Procedure(s) Performed: Procedure(s) (LRB) with comments: REMOVAL PORT-A-CATH (Right) - port removal  Patient Location: PACU  Anesthesia Type:General  Level of Consciousness: awake  Airway and Oxygen Therapy: Patient Spontanous Breathing  Post-op Pain: none  Post-op Assessment: Post-op Vital signs reviewed  Post-op Vital Signs: Reviewed  Complications: No apparent anesthesia complications

## 2012-08-30 NOTE — Anesthesia Procedure Notes (Signed)
Procedure Name: LMA Insertion Date/Time: 08/30/2012 10:44 AM Performed by: Meyer Russel Pre-anesthesia Checklist: Patient identified, Emergency Drugs available, Suction available and Patient being monitored Patient Re-evaluated:Patient Re-evaluated prior to inductionOxygen Delivery Method: Circle System Utilized Preoxygenation: Pre-oxygenation with 100% oxygen Intubation Type: IV induction Ventilation: Mask ventilation without difficulty LMA: LMA inserted LMA Size: 4.0 Number of attempts: 1 Airway Equipment and Method: bite block Placement Confirmation: positive ETCO2 and breath sounds checked- equal and bilateral Tube secured with: Tape Dental Injury: Teeth and Oropharynx as per pre-operative assessment

## 2012-08-30 NOTE — Op Note (Signed)
Preop diagnosis: Left breast cancer status post chemotherapy Postop diagnosis: Same Procedure performed removal of right subclavian vein port Surgeon:Sheronda Parran K. Anesthesia: Gen. Via LMA Indications this is a 61 year old female who is is one-year status post left modified radical mastectomy and right simple mastectomy for breast cancer. She had a right subclavian vein port which was used to chemotherapy. She is now completed her course of chemotherapy and presents for port removal.  Description of procedure: The patient brought to the operating room placed in a supine position on the operating room table. After an adequate level of general anesthesia was obtained, the right chest was prepped with chlor prep and draped sterile fashion. A timeout was taken to ensure the proper patient proper procedure. We infiltrated the area around the port with quarter percent Marcaine with epinephrine. We open the incision and dissected down to the port. The sutures were removed. The port was removed and direct pressure was held for several minutes. No bleeding was noted. We excised some of the fibrin sheath around the port. The wound was closed with a deep layer of 3-0 Vicryl and a subcuticular layer of 4-0 Monocryl. Steri-Strips and dressings were applied. The patient was then extubated and brought to recovery in stable condition. All sponge, initially, and needle counts are correct.  Wilmon Arms. [name], MD, [facility] Surgery  08/30/2012 11:09 AM

## 2012-08-30 NOTE — Transfer of Care (Signed)
Immediate Anesthesia Transfer of Care Note  Patient: [name] Riley  Procedure(s) Performed: Procedure(s) (LRB) with comments: REMOVAL PORT-A-CATH (Right) - port removal  Patient Location: PACU  Anesthesia Type:General  Level of Consciousness: awake and sedated, follows commands   Airway & Oxygen Therapy: Patient Spontanous Breathing and Patient connected to face mask oxygen  Post-op Assessment: Report given to PACU RN, Post -op Vital signs reviewed and stable and Patient moving all extremities  Post vital signs: Reviewed and stable  Complications: No apparent anesthesia complications

## 2012-08-30 NOTE — Anesthesia Preprocedure Evaluation (Addendum)
Anesthesia Evaluation  Patient identified by MRN, date of birth, ID band Patient awake    Reviewed: Allergy & Precautions, H&P , NPO status , Patient's Chart, lab work & pertinent test results, reviewed documented beta blocker date and time   History of Anesthesia Complications (+) PONV  Airway Mallampati: I TM Distance: >3 FB Neck ROM: Full    Dental  (+) Dental Advisory Given   Pulmonary  breath sounds clear to auscultation        Cardiovascular hypertension, Pt. on medications and Pt. on home beta blockers + CAD and + Past MI Rhythm:Regular Rate:Normal     Neuro/Psych    GI/Hepatic   Endo/Other  Hypothyroidism   Renal/GU      Musculoskeletal   Abdominal   Peds  Hematology   Anesthesia Other Findings   Reproductive/Obstetrics                           Anesthesia Physical Anesthesia Plan  ASA: III  Anesthesia Plan: General   Post-op Pain Management:    Induction: Intravenous  Airway Management Planned: LMA  Additional Equipment:   Intra-op Plan:   Post-operative Plan: Extubation in OR  Informed Consent:   Dental advisory given  Plan Discussed with: CRNA and Anesthesiologist  Anesthesia Plan Comments:         Anesthesia Quick Evaluation

## 2012-08-30 NOTE — H&P (Signed)
[name] Riley is an 61 y.o. female.   Chief Complaint: Completed chemotherapy HPI: s/p left modified radical mastectomy, right simple mastectomy 11/14/11.  Now completed chemotherapy.  Presents for port removal.   Past Medical History  Diagnosis Date  . Coronary artery disease   . Hyperlipidemia   . PONV (postoperative nausea and vomiting)     gets sick from anesthesia  . Hypothyroidism   . Cancer     left breast cancer  . Breast cancer 08/25/2011    L , invasive ductal carcinoma, ER/PR +,HER2 -  . Heart attack 09/2000    Sep 25, 2000  --no intervention  . Hypertension   . History of chemotherapy     Past Surgical History  Procedure Date  . Tonsillectomy   . Ovarian cyst surgery 1970  . Skin tags 05/09/1997    left axillary left neck skin tags  . Portacath placement 11/14/2011    Procedure: INSERTION PORT-A-CATH;  Surgeon: Wilmon Arms. Aneliese Beaudry, MD;  Location: [facility] OR;  Service: General;  Laterality: Right;  . Breast surgery 1998    removal of benign lump in rt breast  . Breast surgery 11/14/11    right simple mastectomy, left mrm  . Abdominal hysterectomy 1998    TAH, oophorectomy  . Appendectomy 1970    Family History  Problem Relation Age of Onset  . Hypertension Maternal Grandmother   . Diabetes Maternal Grandmother   . Cancer Father 68    lung cancer and Prostate Cancer  . Hypertension Mother   . Cancer Paternal Aunt     ovarian  . Cancer Cousin     breast, paternal cousin  . Cancer Paternal Uncle     stomach  . Cancer Paternal Grandfather     Esophagus   Social History:  reports that she has never smoked. She has never used smokeless tobacco. She reports that she does not drink alcohol or use illicit drugs.  Allergies:  Allergies  Allergen Reactions  . Contrast Media (Iodinated Diagnostic Agents) Shortness Of Breath and Rash  . Food Shortness Of Breath    Cantaloupe    Medications Prior to Admission  Medication Sig Dispense Refill  . anastrozole  (ARIMIDEX) 1 MG tablet Take 1 tablet (1 mg total) by mouth daily.  30 tablet  4  . aspirin 81 MG tablet Take 81 mg by mouth daily.        Marland Kitchen emollient (BIAFINE) cream Apply 1 application topically daily. Apply to afftected skin area after rad txs and bedtime,prn, not 4 hours prior to rad tx      . levothyroxine (SYNTHROID, LEVOTHROID) 75 MCG tablet Take 75 mcg by mouth daily.       Marland Kitchen LORazepam (ATIVAN) 0.5 MG tablet Take 1 tablet (0.5 mg total) by mouth every 6 (six) hours as needed for anxiety (Nausea or vomiting).  90 tablet  0  . metoprolol (TOPROL-XL) 100 MG 24 hr tablet Take 100 mg by mouth daily.       . Multiple Vitamins-Minerals (MULTIVITAMIN WITH MINERALS) tablet Take 1 tablet by mouth daily.        . non-metallic deodorant Thornton Papas) MISC Apply 1 application topically daily as needed. Apply after rad and prn,but not before 4 hours prior to rad txs        Results for orders placed during the hospital encounter of 08/30/12 (from the past 48 hour(s))  POCT I-STAT, CHEM 8     Status: Abnormal   Collection Time  08/30/12  9:48 AM      Component Value Range Comment   Sodium 141  135 - 145 mEq/L    Potassium 3.8  3.5 - 5.1 mEq/L    Chloride 103  96 - 112 mEq/L    BUN 13  6 - 23 mg/dL    Creatinine, Ser 1.61  0.50 - 1.10 mg/dL    Glucose, Bld 97  70 - 99 mg/dL    Calcium, Ion 0.96 (*) 1.12 - 1.23 mmol/L    TCO2 25  0 - 100 mmol/L    Hemoglobin 12.9  12.0 - 15.0 g/dL    HCT 04.5  40.9 - 81.1 %    No results found.  ROS  Blood pressure 154/92, pulse 68, temperature 98.5 F (36.9 C), temperature source Oral, resp. rate 20, height 5\' 8"  (1.727 m), weight 172 lb 6 oz (78.189 kg), SpO2 97.00%. Physical Exam  Healed mastectomy incisions. Right subclavian port site - well-healed  Assessment/Plan Port removal.  The surgical procedure has been discussed with the patient.  Potential risks, benefits, alternative treatments, and expected outcomes have been explained.  All of the patient's  questions at this time have been answered.  The likelihood of reaching the patient's treatment goal is good.  The patient understand the proposed surgical procedure and wishes to proceed.   Kaedan Richert K. 08/30/2012, 10:09 AM

## 2012-08-31 ENCOUNTER — Encounter (HOSPITAL_BASED_OUTPATIENT_CLINIC_OR_DEPARTMENT_OTHER): Payer: Self-pay | Admitting: Surgery

## 2012-09-05 NOTE — Progress Notes (Signed)
  Radiation Oncology         [phone] ________________________________  Name: [name] Riley MRN: [id number]  Date: 08/13/2012  DOB: 11-20-1950  Complex simulation note  The patient has undergone complex simulation for her upcoming boost treatment for her diagnosis of breast cancer. The patient has initially been planned to receive 50.4 gray. The patient will now receive a 10 gray boost to the chest wall/surgical scar which has been contoured. This will be accomplished using an en face electron field. Based on the depth of the target area, 6 MeV electrons will be used and this field has been normalized to the 90% isodose line with 0.5 cm bolus. The patient's final total dose therefore will be 60.4 gray. A special port plan is requested for the boost treatment.   _______________________________  Radene Gunning, MD, PhD

## 2012-09-05 NOTE — Progress Notes (Signed)
  Radiation Oncology         [phone] ________________________________  Name: [name] Riley MRN: [id number]  Date: 08/22/2012  DOB: 1951/09/24  End of Treatment Note  Diagnosis:   Invasive ductal carcinoma of the left breast     Indication for treatment:  Curative       Radiation treatment dates:   07/09/2012 through 08/22/2012  Site/dose:   The patient was initially treated to the left chest wall and left supraclavicular region using a 4 field technique. This delivered 50.4 gray at 1.8 gray per fraction. The patient then received 18 gray boost to the mastectomy scar with margin using an en face electron field. The total dose was 60.4 gray.  Narrative: The patient tolerated radiation treatment relatively well.   The patient experience some skin irritation towards the end of treatment. Overall her skin held up quite well without substantial moist desquamation at the end of treatment.  Plan: The patient has completed radiation treatment. The patient will return to radiation oncology clinic for routine followup in one month. I advised the patient to call or return sooner if they have any questions or concerns related to their recovery or treatment. ________________________________  Radene Gunning, M.D., Ph.D.

## 2012-09-25 ENCOUNTER — Encounter: Payer: Self-pay | Admitting: Radiation Oncology

## 2012-09-26 ENCOUNTER — Ambulatory Visit
Admission: RE | Admit: 2012-09-26 | Discharge: 2012-09-26 | Disposition: A | Discharge status: Home / Self Care | Payer: Medicaid Other | Source: Ambulatory Visit | Attending: Radiation Oncology | Admitting: Radiation Oncology

## 2012-09-26 ENCOUNTER — Encounter: Payer: Self-pay | Admitting: Radiation Oncology

## 2012-09-26 DIAGNOSIS — C50419 Malignant neoplasm of upper-outer quadrant of unspecified female breast: Secondary | ICD-10-CM

## 2012-09-26 HISTORY — DX: Allergy, unspecified, initial encounter: T78.40XA

## 2012-09-26 NOTE — Progress Notes (Signed)
Patient here follow up s/p rad txs left breast :07/09/12-08/22/12 total 60.4gray Alert,oriented x3, left chest wall well healed a couple areas hyper pigmentation still small areas, taking Arimidex 1mg  daily Some hot flashes occasionally, eating and drinking well, no fatigue, does have left leg achines if sitting in church and stands up takes a little while before that resolves, has started since taking arimidex states patient 1:27 PM

## 2012-09-26 NOTE — Progress Notes (Signed)
  Radiation Oncology         [phone] ________________________________  Name: [name] Riley MRN: [id number]  Date: 09/26/2012  DOB: 1951/08/08  Follow-Up Visit Note  CC: Josue Hector, MD  Pierce Crane, MD  Diagnosis:   Invasive ductal carcinoma of the left breast  Interval Since Last Radiation:  One month   Narrative:  The patient returns today for routine follow-up.  The patient states that she is done well since she finished treatment. She is pleased with how her skin has healed up. No ongoing issues. She has begun taking anti-hormonal treatment.                              ALLERGIES:  is allergic to contrast media and food.  Meds: Current Outpatient Prescriptions  Medication Sig Dispense Refill  . anastrozole (ARIMIDEX) 1 MG tablet Take 1 tablet (1 mg total) by mouth daily.  30 tablet  4  . aspirin 81 MG tablet Take 81 mg by mouth daily.        Marland Kitchen emollient (BIAFINE) cream Apply 1 application topically daily. Apply to afftected skin area after rad txs and bedtime,prn, not 4 hours prior to rad tx      . HYDROcodone-acetaminophen (NORCO/VICODIN) 5-325 MG per tablet Take 1 tablet by mouth every 4 (four) hours as needed for pain.  40 tablet  0  . levothyroxine (SYNTHROID, LEVOTHROID) 75 MCG tablet Take 75 mcg by mouth daily.       Marland Kitchen LORazepam (ATIVAN) 0.5 MG tablet Take 1 tablet (0.5 mg total) by mouth every 6 (six) hours as needed for anxiety (Nausea or vomiting).  90 tablet  0  . metoprolol (TOPROL-XL) 100 MG 24 hr tablet Take 100 mg by mouth daily.       . Multiple Vitamins-Minerals (MULTIVITAMIN WITH MINERALS) tablet Take 1 tablet by mouth daily.        . non-metallic deodorant Thornton Papas) MISC Apply 1 application topically daily as needed. Apply after rad and prn,but not before 4 hours prior to rad txs       No current facility-administered medications for this encounter.   Facility-Administered Medications Ordered in Other Encounters  Medication Dose Route Frequency  Provider Last Rate Last Dose  . lidocaine-prilocaine (EMLA) cream   Topical Once Pierce Crane, MD        Physical Findings: The patient is in no acute distress. Patient is alert and oriented.  vitals were not taken for this visit.Marland Kitchen   Residual hyperpigmentation in the treatment area. The surgical site looks good. No ongoing desquamation.  Lab Findings: Lab Results  Component Value Date   WBC 3.8* 08/10/2012   HGB 12.9 08/30/2012   HCT 38.0 08/30/2012   MCV 93.7 08/10/2012   PLT 277 08/10/2012     Radiographic Findings: No results found.  Impression:    The patient is doing very well 1 month after completing adjuvant radiotherapy.  Plan:  She will return to our clinic on a when necessary basis.   Radene Gunning, M.D., Ph.D.

## 2012-11-07 ENCOUNTER — Telehealth: Payer: Self-pay | Admitting: Oncology

## 2012-11-07 NOTE — Telephone Encounter (Signed)
The patient is scheduled 11/13/12 and called to see if she should still come in>  She received the letter telling her Dr. Donnie Coffin is no longer here.   I explained a team of schedulers is coming in Saturday 11/10/12 and will call to reschedule.  She requested a female physician.  Did not specify a name. I will pass this request on.

## 2012-11-09 ENCOUNTER — Telehealth: Payer: Self-pay | Admitting: *Deleted

## 2012-11-09 NOTE — Telephone Encounter (Signed)
Pt called about who she was going to see since Dr. Donnie Coffin is no longer here and I confirmed 11/13/12 appt w/ pt.  She requested Dr. Welton Flakes.

## 2012-11-13 ENCOUNTER — Ambulatory Visit: Payer: Medicaid Other | Admitting: Oncology

## 2012-11-13 ENCOUNTER — Telehealth: Payer: Self-pay | Admitting: Oncology

## 2012-11-13 ENCOUNTER — Other Ambulatory Visit: Payer: Medicaid Other

## 2012-11-13 ENCOUNTER — Ambulatory Visit (HOSPITAL_BASED_OUTPATIENT_CLINIC_OR_DEPARTMENT_OTHER): Payer: Medicaid Other | Admitting: Nurse Practitioner

## 2012-11-13 VITALS — BP 180/90 | HR 88 | Temp 97.9°F | Resp 20 | Ht 68.0 in | Wt 178.5 lb

## 2012-11-13 DIAGNOSIS — C50419 Malignant neoplasm of upper-outer quadrant of unspecified female breast: Secondary | ICD-10-CM

## 2012-11-13 DIAGNOSIS — C50919 Malignant neoplasm of unspecified site of unspecified female breast: Secondary | ICD-10-CM

## 2012-11-13 DIAGNOSIS — Z170 Estrogen receptor positive status [ER+]: Secondary | ICD-10-CM

## 2012-11-13 DIAGNOSIS — C773 Secondary and unspecified malignant neoplasm of axilla and upper limb lymph nodes: Secondary | ICD-10-CM

## 2012-11-13 DIAGNOSIS — C50912 Malignant neoplasm of unspecified site of left female breast: Secondary | ICD-10-CM

## 2012-11-13 NOTE — Telephone Encounter (Signed)
gv and printed appt schedule for pt for July...the patient aware °

## 2012-11-14 ENCOUNTER — Encounter: Payer: Self-pay | Admitting: Nurse Practitioner

## 2012-11-14 NOTE — Progress Notes (Signed)
[facility]  Telephone:([phone]) [phone] Fax:([phone]) [phone]   OFFICE PROGRESS NOTE   Cc:  Josue Hector, MD  DIAGNOSIS: T2N1a invasive ductal carcinoma in LEFT breast 3/18 nodes positive, evidence of extracapsular extension, ER/PR positive at 89/81% respectively, Ki-67 at 79%, HER-2 negative.   PAST THERAPY:  Bilateral mastectomy (Prophylactic on Right) on 11/14/2011    4 cycles FEC    4 cycles Taxotere   Radiation completed 08/22/2012  CURRENT THERAPY:  Arimidex, started 08/22/2012  INTERVAL HISTORY: [name] Riley 62 y.o. female returns for scheduled follow up of her hormonal therapy for her breast cancer. She began Arimidex in November, and has been tolerating it with minimal difficulty. 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   Past Medical History  Diagnosis Date  . Coronary artery disease   . Hyperlipidemia   . PONV (postoperative nausea and vomiting)     gets sick from anesthesia  . Hypothyroidism   . Cancer     left breast cancer  . Breast cancer 08/25/2011    L , invasive ductal carcinoma, ER/PR +,HER2 -  . Heart attack 09/2000    Sep 25, 2000  --no intervention  . Hypertension   . History of chemotherapy comp. 08/22/2012    4 cycles of FEC and $ cycles of Taxotere  . Status post radiation therapy 07/09/12 - 08/22/2012    Left Breast, 60.4 gray  . Allergy     Past Surgical History  Procedure Date  . Tonsillectomy   . Ovarian cyst surgery 1970  . Skin tags 05/09/1997    left axillary left neck skin tags  . Portacath placement 11/14/2011    Procedure: INSERTION PORT-A-CATH;  Surgeon: Wilmon Arms. Tsuei, MD;  Location: [facility] OR;  Service: General;  Laterality: Right;  . Breast surgery 1998    removal of benign lump in rt breast  . Breast surgery 11/14/11    right simple mastectomy, left mrm  . Abdominal hysterectomy 1998    TAH, oophorectomy  . Appendectomy 1970  . Port-a-cath  removal 08/30/2012    Procedure: REMOVAL PORT-A-CATH;  Surgeon: Wilmon Arms. [name], MD;  Location: [facility];  Service: General;  Laterality: Right;  port removal    Current Outpatient Prescriptions  Medication Sig Dispense Refill  . anastrozole (ARIMIDEX) 1 MG tablet Take 1 tablet (1 mg total) by mouth daily.  30 tablet  4  . aspirin 81 MG tablet Take 81 mg by mouth daily.        Marland Kitchen HYDROcodone-acetaminophen (NORCO/VICODIN) 5-325 MG per tablet Take 1 tablet by mouth every 4 (four) hours as needed for pain.  40 tablet  0  . levothyroxine (SYNTHROID, LEVOTHROID) 75 MCG tablet Take 75 mcg by mouth daily.       . metoprolol (TOPROL-XL) 100 MG 24 hr tablet Take 100 mg by mouth daily.       . Multiple Vitamins-Minerals (MULTIVITAMIN WITH MINERALS) tablet Take 1 tablet by mouth daily.        Marland Kitchen emollient (BIAFINE) cream Apply 1 application topically daily. Apply to afftected skin area after rad txs and bedtime,prn, not 4 hours prior to rad tx      . LORazepam (ATIVAN) 0.5 MG tablet Take 1 tablet (0.5 mg total) by mouth every 6 (six) hours as needed for anxiety (Nausea or vomiting).  90 tablet  0  . non-metallic deodorant (ALRA) MISC Apply 1 application topically  daily as needed. Apply after rad and prn,but not before 4 hours prior to rad txs       No current facility-administered medications for this visit.   Facility-Administered Medications Ordered in Other Visits  Medication Dose Route Frequency Provider Last Rate Last Dose  . lidocaine-prilocaine (EMLA) cream   Topical Once Pierce Crane, MD        ALLERGIES:  is allergic to contrast media and food.  REVIEW OF SYSTEMS:  She has occasional hot flashes, and some mild joint pain but she relates she had the joint pain prior to starting Arimidex. HEENT She specifically denied headaches, dizziness, blurred vision, or hallucinations. LUNGS: denies wheezing, cough or dyspnea CARDIAC: denies chest pain or pressure GI: denies nausea  vomiting or diarrhea - admits to occasional constipation GU: no difficulty passing stool or urine, no blood in urine, no vaginal discharge MUSCULOSKELETAL: has underlying chronic joint pain ROS otherwise negative   Filed Vitals:   11/13/12 1104  BP: 180/90  Pulse: 88  Temp: 97.9 F (36.6 C)  Resp: 20   Wt Readings from Last 3 Encounters:  11/13/12 178 lb 8 oz (80.967 kg)  08/30/12 172 lb 6 oz (78.189 kg)  08/30/12 172 lb 6 oz (78.189 kg)   ECOG Performance status: 0  PHYSICAL EXAMINATION: 62 yr old white female who appears her stated age.   General:  well-nourished in no acute distress.  Eyes:  no scleral icterus.  ENT:  There were no oropharyngeal lesions.  Neck was without thyromegaly.  Lymphatics:  Negative cervical, supraclavicular or axillary adenopathy.  Respiratory: lungs were clear bilaterally without wheezing or crackles.  Cardiovascular:  Regular rate and rhythm, S1/S2, without murmur, rub or gallop.  There was no pedal edema.  Breast Exam: Bilateral mastectomy incisions are well healed. No redness of the skin or subcutaneous nodules. Left chest wall area has some dry skin  GI:  abdomen was soft, flat, nontender, nondistended, without organomegaly.  Musculoskeletal:  no spinal tenderness of palpation of vertebral spine.  Skin exam was without echymosis, petichae.  Neuro exam was nonfocal.  Patient was able to get on and off exam table without assistance.  Gait was normal.  Patient was alerted and oriented.  Attention was good.   Language was appropriate.  Mood was normal without depression.  Speech was not pressured.  Thought content was not tangential.     LABORATORY/RADIOLOGY DATA: No labs were obtained today but have been ordered prior to patient's next scheduled office visit. Lab Results  Component Value Date   WBC 3.8* 08/10/2012   HGB 12.9 08/30/2012   HCT 38.0 08/30/2012   PLT 277 08/10/2012   GLUCOSE 97 08/30/2012   ALT 20 08/10/2012   AST 19 08/10/2012   NA  141 08/30/2012   K 3.8 08/30/2012   CL 103 08/30/2012   CREATININE 0.70 08/30/2012   BUN 13 08/30/2012   CO2 26 08/10/2012    ASSESSMENT AND PLAN: 62 year old white female with T2 N1a Invasive ductal carcinoma of LEFT breast who is s/p bilateral mastectomy one year ago (3/18 nodes positive) She completed adjuvant chemo with  4 cycles FEC followed by 4 cycles Taxotere and completed radiation 08/22/2012. She began Arimidex 08/22/2012, and is tolerating it fine. She will follow up with Dr. Welton Flakes in 6 months, but knows she can contact us sooner if needed.. All of her questions were answered to her satisfaction.  The length of time of the face-to-face encounter was 30  minutes.  More than 50% of time was spent counseling and coordination of care.  Plan of care discussed with Dr. Welton Flakes.  [name] Medico, NP-C, AOCNP

## 2012-11-15 NOTE — Progress Notes (Signed)
Hematology and Oncology Follow Up Visit  [name] Riley [id number] [birth year] 62 y.o. 11/15/2012 3:13 PM PCP  Principle Diagnosis: ER/PR positive breast cancer and 62 year old woman  Interim History:  There have been no intercurrent illness, hospitalizations or medication changes. Patient has undergone bilateral mastectomies on 11/06/2011. Marland Kitchen Final pathology showed on the right side fibrous cystic changes and no evidence of cancer, left-sided showed a 4.3 cm grade 3 invasive ductal cancer with focally involved margin anteriorly deep margin is free of 18 lymph nodes were removed 3 of which had metastatic disease some with extracapsular extension. ER +89%, PR +81% HER-2 negative Ki-67 79%. Medications: I have reviewed the patient's current medications.  Allergies:  Allergies  Allergen Reactions  . Contrast Media (Iodinated Diagnostic Agents) Shortness Of Breath and Rash  . Food Shortness Of Breath    Cantaloupe    Past Medical History, Surgical history, Social history, and Family History were reviewed and updated.  Review of Systems: Constitutional:  Negative for fever, chills, night sweats, anorexia, weight loss, pain. Cardiovascular: no chest pain or dyspnea on exertion Respiratory: negative Neurological: negative Dermatological: negative ENT: negative Skin Gastrointestinal: negative Genito-Urinary: negative Hematological and Lymphatic: negative Breast: negative Musculoskeletal: negative Remaining ROS negative.  Physical Exam: Blood pressure 169/90, pulse 88, temperature 98.4 F (36.9 C), temperature source Oral, height 5\' 8"  (1.727 m), weight 179 lb 1.6 oz (81.239 kg). ECOG:  General appearance: alert, cooperative and appears stated age Limited exam in today to the chest wall shows healing scars with no obvious evidence of infection. Port site looks normal. Lab Results: Lab Results  Component Value Date   WBC 3.8* 08/10/2012   HGB 12.9 08/30/2012   HCT 38.0 08/30/2012   MCV 93.7 08/10/2012   PLT 277 08/10/2012     Chemistry      Component Value Date/Time   NA 141 08/30/2012 0948   NA 142 08/10/2012 0908   K 3.8 08/30/2012 0948   K 3.5 08/10/2012 0908   CL 103 08/30/2012 0948   CL 105 08/10/2012 0908   CO2 26 08/10/2012 0908   CO2 27 05/17/2012 1006   BUN 13 08/30/2012 0948   BUN 14.0 08/10/2012 0908   CREATININE 0.70 08/30/2012 0948   CREATININE 0.6 08/10/2012 0908      Component Value Date/Time   CALCIUM 10.3 08/10/2012 0908   CALCIUM 10.2 05/17/2012 1006   ALKPHOS 140 08/10/2012 0908   ALKPHOS 88 05/17/2012 1006   AST 19 08/10/2012 0908   AST 23 05/17/2012 1006   ALT 20 08/10/2012 0908   ALT 28 05/17/2012 1006   BILITOT 0.60 08/10/2012 0908   BILITOT 0.4 05/17/2012 1006      .pathology. Radiological Studies: chest X-ray NA Mammogram NA Bone density na  Impression and Plan: Node +, er/pr + breast cancer. 2d echo wnl, staging scans are negative. She has had chemotherapy teaching. We will plan to begin [facility] chemotherapy on a q 2week basis if she tolerates it. I have sent her scripts.  Pierce Crane, MD

## 2013-01-14 ENCOUNTER — Other Ambulatory Visit: Payer: Self-pay | Admitting: *Deleted

## 2013-01-14 DIAGNOSIS — C50912 Malignant neoplasm of unspecified site of left female breast: Secondary | ICD-10-CM

## 2013-01-14 DIAGNOSIS — E559 Vitamin D deficiency, unspecified: Secondary | ICD-10-CM

## 2013-01-14 MED ORDER — ANASTROZOLE 1 MG PO TABS: 1.0000 mg | ORAL_TABLET | Freq: Every day | ORAL | Status: DC

## 2013-01-25 ENCOUNTER — Encounter: Payer: Self-pay | Admitting: Gastroenterology

## 2013-02-26 ENCOUNTER — Ambulatory Visit (INDEPENDENT_AMBULATORY_CARE_PROVIDER_SITE_OTHER): Payer: Medicaid Other | Admitting: Surgery

## 2013-04-09 ENCOUNTER — Ambulatory Visit (INDEPENDENT_AMBULATORY_CARE_PROVIDER_SITE_OTHER): Payer: Medicaid Other | Admitting: Surgery

## 2013-04-11 ENCOUNTER — Encounter (INDEPENDENT_AMBULATORY_CARE_PROVIDER_SITE_OTHER): Payer: Self-pay | Admitting: Surgery

## 2013-04-11 ENCOUNTER — Ambulatory Visit (INDEPENDENT_AMBULATORY_CARE_PROVIDER_SITE_OTHER): Payer: BC Managed Care – PPO | Admitting: Surgery

## 2013-04-11 VITALS — BP 164/72 | HR 84 | Temp 98.7°F | Resp 20 | Ht 68.0 in | Wt 175.8 lb

## 2013-04-11 DIAGNOSIS — C50912 Malignant neoplasm of unspecified site of left female breast: Secondary | ICD-10-CM

## 2013-04-11 DIAGNOSIS — C50919 Malignant neoplasm of unspecified site of unspecified female breast: Secondary | ICD-10-CM

## 2013-04-11 NOTE — Progress Notes (Signed)
This patient is status post left modified radical mastectomy and right simple mastectomy in January of 2013.  She had a T2 N1 A. Invasive ductal carcinoma with 3 at 18 nodes positive with extracapsular extension. ER/PR positive. KI 67 at 79%. Her 2 negative.  She underwent 4 cycles of FEC and 4 cycles of Taxotere, as well as radiation. Port removed 11/13.  Currently on Arimidex  She is doing quite well. No new complaints other than occasional hip pain. She plans to speak with Dr. Welton Flakes at her next appointment which is in a few weeks. They be a side effect of her Arimidex.  Both incisions are well healed. No palpable nodules on either side. No lymphadenopathy on either side. The radiation changes on the left seem to be fading.  Continue with current management. We will see her on annual basis to make sure that she does not have any nodules under her mastectomy sites.  [name] Riley. [name], MD, [facility]  General/ Trauma Surgery  04/11/2013 3:44 PM

## 2013-04-12 ENCOUNTER — Ambulatory Visit (INDEPENDENT_AMBULATORY_CARE_PROVIDER_SITE_OTHER): Payer: Medicaid Other | Admitting: Surgery

## 2013-05-15 ENCOUNTER — Encounter: Payer: Self-pay | Admitting: Oncology

## 2013-05-15 ENCOUNTER — Other Ambulatory Visit: Payer: Self-pay | Admitting: Emergency Medicine

## 2013-05-15 ENCOUNTER — Ambulatory Visit (HOSPITAL_BASED_OUTPATIENT_CLINIC_OR_DEPARTMENT_OTHER): Payer: Self-pay | Admitting: Oncology

## 2013-05-15 ENCOUNTER — Telehealth: Payer: Self-pay | Admitting: *Deleted

## 2013-05-15 ENCOUNTER — Other Ambulatory Visit (HOSPITAL_BASED_OUTPATIENT_CLINIC_OR_DEPARTMENT_OTHER): Payer: Self-pay | Admitting: Lab

## 2013-05-15 VITALS — BP 172/99 | HR 76 | Temp 98.9°F | Resp 20 | Ht 68.0 in | Wt 175.9 lb

## 2013-05-15 DIAGNOSIS — C50419 Malignant neoplasm of upper-outer quadrant of unspecified female breast: Secondary | ICD-10-CM

## 2013-05-15 DIAGNOSIS — C50919 Malignant neoplasm of unspecified site of unspecified female breast: Secondary | ICD-10-CM

## 2013-05-15 DIAGNOSIS — E559 Vitamin D deficiency, unspecified: Secondary | ICD-10-CM

## 2013-05-15 DIAGNOSIS — C50912 Malignant neoplasm of unspecified site of left female breast: Secondary | ICD-10-CM

## 2013-05-15 LAB — COMPREHENSIVE METABOLIC PANEL (CC13)
ALT: 27 U/L (ref 0–55)
AST: 26 U/L (ref 5–34)
Albumin: 4 g/dL (ref 3.5–5.0)
Alkaline Phosphatase: 120 U/L (ref 40–150)
BUN: 14.1 mg/dL (ref 7.0–26.0)
CO2: 26 mEq/L (ref 22–29)
Calcium: 9.8 mg/dL (ref 8.4–10.4)
Chloride: 105 mEq/L (ref 98–109)
Creatinine: 0.8 mg/dL (ref 0.6–1.1)
Glucose: 116 mg/dl (ref 70–140)
Potassium: 3.8 mEq/L (ref 3.5–5.1)
Sodium: 140 mEq/L (ref 136–145)
Total Bilirubin: 0.47 mg/dL (ref 0.20–1.20)
Total Protein: 7.4 g/dL (ref 6.4–8.3)

## 2013-05-15 LAB — CBC WITH DIFFERENTIAL/PLATELET
BASO%: 0.5 % (ref 0.0–2.0)
Basophils Absolute: 0 10*3/uL (ref 0.0–0.1)
EOS%: 3.5 % (ref 0.0–7.0)
Eosinophils Absolute: 0.2 10*3/uL (ref 0.0–0.5)
HCT: 36.7 % (ref 34.8–46.6)
HGB: 12.8 g/dL (ref 11.6–15.9)
LYMPH%: 39.7 % (ref 14.0–49.7)
MCH: 31.5 pg (ref 25.1–34.0)
MCHC: 35 g/dL (ref 31.5–36.0)
MCV: 90.1 fL (ref 79.5–101.0)
MONO#: 0.5 10*3/uL (ref 0.1–0.9)
MONO%: 10.1 % (ref 0.0–14.0)
NEUT#: 2.2 10*3/uL (ref 1.5–6.5)
NEUT%: 46.2 % (ref 38.4–76.8)
Platelets: 247 10*3/uL (ref 145–400)
RBC: 4.07 10*6/uL (ref 3.70–5.45)
RDW: 13.4 % (ref 11.2–14.5)
WBC: 4.7 10*3/uL (ref 3.9–10.3)
lymph#: 1.9 10*3/uL (ref 0.9–3.3)

## 2013-05-15 NOTE — Progress Notes (Signed)
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   PAST THERAPY:  Bilateral mastectomy (Prophylactic on Right) on 11/14/2011    4 cycles FEC    4 cycles Taxotere   Radiation completed 08/22/2012  CURRENT THERAPY:  Arimidex, started 08/22/2012  INTERVAL HISTORY: [name] Riley 62 y.o. female returns for scheduled follow up of her hormonal therapy for her breast cancer. She began Arimidex in November, and has been tolerating it with minimal difficulty. 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   Past Medical History  Diagnosis Date  . Coronary artery disease   . Hyperlipidemia   . PONV (postoperative nausea and vomiting)     gets sick from anesthesia  . Hypothyroidism   . Cancer     left breast cancer  . Breast cancer 08/25/2011    L , invasive ductal carcinoma, ER/PR +,HER2 -  . Heart attack 09/2000    Sep 25, 2000  --no intervention  . Hypertension   . History of chemotherapy comp. 08/22/2012    4 cycles of FEC and $ cycles of Taxotere  . Status post radiation therapy 07/09/12 - 08/22/2012    Left Breast, 60.4 gray  . Allergy     Past Surgical History  Procedure Laterality Date  . Tonsillectomy    . Ovarian cyst surgery  1970  . Skin tags  05/09/1997    left axillary left neck skin tags  . Portacath placement  11/14/2011    Procedure: INSERTION PORT-A-CATH;  Surgeon: Wilmon Arms. Tsuei, MD;  Location: [facility];  Service: General;  Laterality: Right;  . Breast surgery  1998    removal of benign lump in rt breast  . Breast surgery  11/14/11    right simple mastectomy, left mrm  . Abdominal hysterectomy  1998    TAH, oophorectomy  . Appendectomy  1970    . Port-a-cath removal  08/30/2012    Procedure: REMOVAL PORT-A-CATH;  Surgeon: Wilmon Arms. [name], MD;  Location: [facility];  Service: General;  Laterality: Right;  port removal    Current Outpatient Prescriptions  Medication Sig Dispense Refill  . anastrozole (ARIMIDEX) 1 MG tablet Take 1 tablet (1 mg total) by mouth daily.  30 tablet  4  . aspirin 81 MG tablet Take 81 mg by mouth daily.        . Cholecalciferol (VITAMIN D-3 PO) Take 2,000 Int'l Units by mouth daily.      Marland Kitchen levothyroxine (SYNTHROID, LEVOTHROID) 75 MCG tablet Take 75 mcg by mouth daily.       . metoprolol (TOPROL-XL) 100 MG 24 hr tablet Take 100 mg by mouth daily.       . Multiple Vitamins-Minerals (MULTIVITAMIN WITH MINERALS) tablet Take 1 tablet by mouth daily.        Marland Kitchen HYDROcodone-acetaminophen (NORCO/VICODIN) 5-325 MG per tablet Take 1 tablet by mouth every 4 (four) hours as needed for pain.  40 tablet  0   No current facility-administered medications for this visit.    ALLERGIES:  is allergic to contrast media and food.  REVIEW OF SYSTEMS:  She has occasional hot flashes, and some mild joint pain but she relates she had the joint pain  prior to starting Arimidex. HEENT She specifically denied headaches, dizziness, blurred vision, or hallucinations. LUNGS: denies wheezing, cough or dyspnea CARDIAC: denies chest pain or pressure GI: denies nausea vomiting or diarrhea - admits to occasional constipation GU: no difficulty passing stool or urine, no blood in urine, no vaginal discharge MUSCULOSKELETAL: has underlying chronic joint pain ROS otherwise negative   Filed Vitals:   05/15/13 0915  BP: 172/99  Pulse: 76  Temp: 98.9 F (37.2 C)  Resp: 20   Wt Readings from Last 3 Encounters:  05/15/13 175 lb 14.4 oz (79.788 kg)  04/11/13 175 lb 12.8 oz (79.742 kg)  11/13/12 178 lb 8 oz (80.967 kg)   ECOG Performance status: 0  PHYSICAL EXAMINATION: 62 yr old white female who appears her stated age.    General:  well-nourished in no acute distress.  Eyes:  no scleral icterus.  ENT:  There were no oropharyngeal lesions.  Neck was without thyromegaly.  Lymphatics:  Negative cervical, supraclavicular or axillary adenopathy.  Respiratory: lungs were clear bilaterally without wheezing or crackles.  Cardiovascular:  Regular rate and rhythm, S1/S2, without murmur, rub or gallop.  There was no pedal edema.  Breast Exam: Bilateral mastectomy incisions are well healed. No redness of the skin or subcutaneous nodules. Left chest wall area has some dry skin  GI:  abdomen was soft, flat, nontender, nondistended, without organomegaly.  Musculoskeletal:  no spinal tenderness of palpation of vertebral spine.  Skin exam was without echymosis, petichae.  Neuro exam was nonfocal.  Patient was able to get on and off exam table without assistance.  Gait was normal.  Patient was alerted and oriented.  Attention was good.   Language was appropriate.  Mood was normal without depression.  Speech was not pressured.  Thought content was not tangential.     LABORATORY/RADIOLOGY DATA: No labs were obtained today but have been ordered prior to patient's next scheduled office visit. Lab Results  Component Value Date   WBC 4.7 05/15/2013   HGB 12.8 05/15/2013   HCT 36.7 05/15/2013   PLT 247 05/15/2013   GLUCOSE 97 08/30/2012   ALT 20 08/10/2012   AST 19 08/10/2012   NA 141 08/30/2012   K 3.8 08/30/2012   CL 103 08/30/2012   CREATININE 0.70 08/30/2012   BUN 13 08/30/2012   CO2 26 08/10/2012    ASSESSMENT AND PLAN: 62 year old white female with   #1T2 N1a Invasive ductal carcinoma of LEFT breast who is s/p bilateral mastectomy one year ago (3/18 nodes positive) She completed adjuvant chemo with  4 cycles FEC followed by 4 cycles Taxotere and completed radiation 08/22/2012. She began Arimidex 08/22/2012, and is tolerating it fine.   #2 patient will continue vitamin D and she is. I have also recommended she begin Os-Cal on a  daily basis.  #3 she'll be seen back in 6 months time for followup.

## 2013-05-15 NOTE — Telephone Encounter (Signed)
appts made and printed. Pt request to come on her [birth year]. Per her request i gv appts for 11/08/13...td

## 2013-05-15 NOTE — Patient Instructions (Addendum)
Doing well, continue arimidex  Take vitamin D as you are  Begin calcium (oscal) daily  I will see you back in 6 months

## 2013-06-27 ENCOUNTER — Other Ambulatory Visit: Payer: Self-pay | Admitting: *Deleted

## 2013-06-27 DIAGNOSIS — C50912 Malignant neoplasm of unspecified site of left female breast: Secondary | ICD-10-CM

## 2013-06-27 DIAGNOSIS — E559 Vitamin D deficiency, unspecified: Secondary | ICD-10-CM

## 2013-06-27 MED ORDER — ANASTROZOLE 1 MG PO TABS: 1.0000 mg | ORAL_TABLET | Freq: Every day | ORAL | Status: DC

## 2013-11-04 ENCOUNTER — Other Ambulatory Visit: Payer: Self-pay | Admitting: *Deleted

## 2013-11-04 DIAGNOSIS — C50912 Malignant neoplasm of unspecified site of left female breast: Secondary | ICD-10-CM

## 2013-11-04 DIAGNOSIS — E559 Vitamin D deficiency, unspecified: Secondary | ICD-10-CM

## 2013-11-04 MED ORDER — ANASTROZOLE 1 MG PO TABS: 1.0000 mg | ORAL_TABLET | Freq: Every day | ORAL | Status: DC

## 2013-11-08 ENCOUNTER — Telehealth: Payer: Self-pay | Admitting: Oncology

## 2013-11-08 ENCOUNTER — Encounter: Payer: Self-pay | Admitting: Oncology

## 2013-11-08 ENCOUNTER — Other Ambulatory Visit (HOSPITAL_BASED_OUTPATIENT_CLINIC_OR_DEPARTMENT_OTHER): Payer: BC Managed Care – PPO

## 2013-11-08 ENCOUNTER — Ambulatory Visit (HOSPITAL_BASED_OUTPATIENT_CLINIC_OR_DEPARTMENT_OTHER): Payer: BC Managed Care – PPO | Admitting: Oncology

## 2013-11-08 ENCOUNTER — Other Ambulatory Visit: Payer: Self-pay | Admitting: *Deleted

## 2013-11-08 VITALS — BP 170/89 | HR 85 | Temp 98.0°F | Resp 18 | Ht 68.0 in | Wt 175.5 lb

## 2013-11-08 DIAGNOSIS — C50419 Malignant neoplasm of upper-outer quadrant of unspecified female breast: Secondary | ICD-10-CM

## 2013-11-08 DIAGNOSIS — E559 Vitamin D deficiency, unspecified: Secondary | ICD-10-CM

## 2013-11-08 DIAGNOSIS — C50919 Malignant neoplasm of unspecified site of unspecified female breast: Secondary | ICD-10-CM

## 2013-11-08 DIAGNOSIS — C50912 Malignant neoplasm of unspecified site of left female breast: Secondary | ICD-10-CM

## 2013-11-08 DIAGNOSIS — Z170 Estrogen receptor positive status [ER+]: Secondary | ICD-10-CM

## 2013-11-08 DIAGNOSIS — C773 Secondary and unspecified malignant neoplasm of axilla and upper limb lymph nodes: Secondary | ICD-10-CM

## 2013-11-08 DIAGNOSIS — Z9010 Acquired absence of unspecified breast and nipple: Secondary | ICD-10-CM

## 2013-11-08 LAB — COMPREHENSIVE METABOLIC PANEL (CC13)
ALT: 27 U/L (ref 0–55)
AST: 21 U/L (ref 5–34)
Albumin: 4.4 g/dL (ref 3.5–5.0)
Alkaline Phosphatase: 119 U/L (ref 40–150)
Anion Gap: 12 mEq/L — ABNORMAL HIGH (ref 3–11)
BUN: 14.6 mg/dL (ref 7.0–26.0)
CO2: 25 mEq/L (ref 22–29)
Calcium: 10.1 mg/dL (ref 8.4–10.4)
Chloride: 103 mEq/L (ref 98–109)
Creatinine: 0.7 mg/dL (ref 0.6–1.1)
Glucose: 126 mg/dl (ref 70–140)
Potassium: 3.7 mEq/L (ref 3.5–5.1)
Sodium: 140 mEq/L (ref 136–145)
Total Bilirubin: 0.33 mg/dL (ref 0.20–1.20)
Total Protein: 7.7 g/dL (ref 6.4–8.3)

## 2013-11-08 LAB — CBC WITH DIFFERENTIAL/PLATELET
BASO%: 0.7 % (ref 0.0–2.0)
Basophils Absolute: 0 10*3/uL (ref 0.0–0.1)
EOS%: 3.2 % (ref 0.0–7.0)
Eosinophils Absolute: 0.2 10*3/uL (ref 0.0–0.5)
HCT: 38.6 % (ref 34.8–46.6)
HGB: 13.3 g/dL (ref 11.6–15.9)
LYMPH%: 37.5 % (ref 14.0–49.7)
MCH: 31.3 pg (ref 25.1–34.0)
MCHC: 34.4 g/dL (ref 31.5–36.0)
MCV: 90.9 fL (ref 79.5–101.0)
MONO#: 0.6 10*3/uL (ref 0.1–0.9)
MONO%: 9.2 % (ref 0.0–14.0)
NEUT#: 3.4 10*3/uL (ref 1.5–6.5)
NEUT%: 49.4 % (ref 38.4–76.8)
Platelets: 296 10*3/uL (ref 145–400)
RBC: 4.25 10*6/uL (ref 3.70–5.45)
RDW: 12.9 % (ref 11.2–14.5)
WBC: 6.8 10*3/uL (ref 3.9–10.3)
lymph#: 2.6 10*3/uL (ref 0.9–3.3)

## 2013-11-08 MED ORDER — ANASTROZOLE 1 MG PO TABS: 1.0000 mg | ORAL_TABLET | Freq: Every day | ORAL | Status: DC

## 2013-11-08 NOTE — Patient Instructions (Signed)
Breast Cancer Survivor Follow-Up Breast cancer begins when cells in the breast divide too rapidly. The extra cells form a lump (tumor). When the cancer is treated, the goal is to get rid of all cancer cells. However, sometimes a few cells survive. These cancer cells can then grow. They become recurrent cancer. This means the cancer comes back after treatment.  Most cases of recurrent breast cancer develop 3 to 5 years after treatment. However, sometimes it comes back just a few months after treatment. Other times, it does not come back until years later. If the cancer comes back in the same area as the first breast cancer, it is called a local recurrence. If the cancer comes back somewhere else in the body, it is called regional recurrence if the site is fairly near the breast or distant recurrence if it is far from the breast. Your caregiver may also use the term metastasize to indicate a cancer that has gone to another part of your body. Treatment is still possible after either kind of recurrence. The cancer can still be controlled.  CAUSES OF RECURRENT CANCER No one knows exactly why breast cancer starts in the first place. Why the cancer comes back after treatment is also not clear. It is known that certain conditions, called risk factors, can make this more likely. They include:  Developing breast cancer for the first time before age 60.  Having breast cancer that involves the lymph nodes. These are small, round pieces of tissue found all over the body. Their job is to help fight infections.  Having a large tumor. Cancer is more apt to come back if the first tumor was bigger than 2 inches (5 cm).  Having certain types of breast cancer, such as:  Inflammatory breast cancer. This rare type grows rapidly and causes the breast to become red and swollen.  A high-grade tumor. The grade of a tumor indicates how fast it will grow and spread. High-grade tumors grow more quickly than other types.  HER2  cancer. This refers to the tumor's genetic makeup. Tumors that have this type of gene are more likely to come back after treatment.  Having close tumor margins. This refers to the space between the tumor and normal, noncancerous cells. If the space is small, the tumor has a greater chance of coming back.  Having treatment involving a surgery to remove the tumor but not the entire breast (lumpectomy) and no radiation therapy. CARE AFTER BREAST CANCER Home Monitoring Women who have had breast cancer should continue to examine their breasts every month. The goal is to catch the cancer quickly if it comes back. Many women find it helpful to do so on the same day each month and to mark the calendar as a reminder. Let your caregiver know immediately if you have any signs of recurrent breast cancer. Symptoms will vary, depending on where the cancer recurs. The original type of treatment can also make a difference. Symptoms of local recurrence after a lumpectomy or a recurrence in the opposite breast may include:  A new lump or thickening in the breast.  A change in the way the skin looks on the breast (such as a rash, dimpling, or wrinkling).  Redness or swelling of the breast.  Changes in the nipple (such as being red, puckered, swollen, or leaking fluid). Symptoms of a recurrence after a breast removal surgery (mastectomy) may include:  A lump or thickening under the skin.  A thickening around the mastectomy scar. Symptoms   of regional recurrence in the lymph nodes near the breast may include:  A lump under the arm or above the collarbone.  Swelling of the arm.  Pain in the arm, shoulder, or chest.  Numbness in the hand or arm. Symptoms of distant recurrence may include:  A cough that does not go away.  Trouble breathing or shortness of breath.  Pain in the bones or the chest. This is pain that lasts or does not respond to rest and medicine.  Headaches.  Sudden vision  problems.  Dizziness.  Nausea or vomiting.  Losing weight without trying to.  Persistent abdominal pain.  Changes in bowel movements or blood in the stool.  Yellowing of the skin or eyes (jaundice).  Blood in the urine or bloody vaginal discharge. Clinical Monitoring  It is helpful to keep a schedule of appointments for needed tests and exams. This includes physical exams, breast exams, exams of the lymph nodes, and general exams.  For the first 3 years after being treated for breast cancer, see your caregiver every 3 to 6 months.  For years 4 and 5 after breast cancer, see your caregiver every 6 to 12 months.  After 5 years, see your caregiver at least once a year.  Regular breast X-rays (mammograms) should continue even if you had a mastectomy.  A mammogram should be done 1 year after the mammogram that first detected breast cancer.  A mammogram should be done every 6 to 12 months after that. Follow your caregiver's advice.  A pelvic exam done by your caregiver checks whether female organs are the normal size and shape. The exam is usually done every year. Ask your caregiver if that schedule is right for you.  Women taking tamoxifen should report any vaginal bleeding immediately to their caregiver. Tamoxifen is often given to women with a certain type of breast cancer. It has been shown to help prevent recurrence.  You will need to decide who your primary caregiver will be.  Most people continue to see their cancer specialist (oncologist) every 3 to 6 months for the first year after cancer treatment.  At some point, you may want to go back to seeing your family caregiver. You would no longer see your oncologist for regular checkups. Many women do this about 1 year after their first diagnosis of breast cancer.  You will still need to be seen every so often by your oncologist. Ask how often that should be. Coordinate this with your family or primary caregiver.  Think about  having genetic counseling. This would provide information on traits that can be passed or inherited from one generation to the next. In some cases, breast cancer runs in families. Tell your caregiver if you:  Are of Ashkenazi Jewish heritage.  Have any family member who has had ovarian cancer.  Have a mother, sister, or daughter who had breast cancer before age 7.  Have 2 or more close relatives who have had breast cancer. This means a mother, sister, daughter, aunt, or grandmother.  Had breast cancer in both breasts.  Have a female relative who has had breast cancer.  Some tests are not recommended for routine screening. Someone recovering from breast cancer does not need to have these tests if there are no problems. The tests have risks, such as radiation exposure, and can be costly. The risks of these tests are thought to be greater than the benefits:  Blood tests.  Chest X-rays.  Bone scans.  Liver ultrasound.  Computed tomography (CT scan).  Positron emission tomography (PET scan).  Magnetic resonance imaging (MRI scan). DIAGNOSIS OF RECURRENT CANCER Recurrent breast cancer may be suspected for various reasons. A mammogram may not look normal. You might feel a lump or have other symptoms. Your caregiver may find something unusual during an exam. To be sure, your caregiver will probably order some tests. The tests are needed because there are symptoms or hints of a problem. They could include:  Blood tests, including a test to check how well the liver is working. The liver is a common site for a distant cancer recurrence.  Imaging tests that create pictures of the inside of the body. These tests include:  Chest X-rays to show if the cancer has come back in the lungs.  CT scans to create detailed pictures of various areas of the body and help find a distant recurrence.  MRI scans to find anything unusual in the breast, chest, or lymph nodes.  Breast ultrasound tests to  examine the breasts.  Bone scans to create a picture of your whole skeleton and find cancer in bony areas.  PET scans to create an image of the whole body. PET scans can be used together with CT scans to show more detail.  Biopsy. A small sample of tissue is taken and checked under a microscope. If cancer cells are found, they may be tested to see if they contain the HER2 gene or the hormones estrogen and progesterone. This will help your caregiver decide how to treat the recurrent cancer. TREATMENT  How recurrent breast cancer is treated depends on where the new cancer is found. The type of treatment that was used for the first breast cancer makes a difference, too. A combination of treatments may be used. Options include:  Surgery.  If the cancer comes back in the breast that was not treated before, you may need a lumpectomy or mastectomy.  If the cancer comes back in the breast that was treated before, you may need a mastectomy.  The lymph nodes under the arm may need to be removed.  Radiation therapy.  For a local recurrence, radiation may be used if it was not used during the first treatment.  For a distance recurrence, radiation is sometimes used.  Chemotherapy.  This may be used before surgery to treat recurrent breast cancer.  This may be used to treat recurrent cancer that cannot be treated with surgery.  This may be used to treat a distant recurrence.  Hormone therapy.  Women with the HER2 gene may be given hormone therapy to attack this gene. Document Released: 06/01/2011 Document Revised: 12/26/2011 Document Reviewed: 06/01/2011 [employer] Patient Information 2014 [employer].  

## 2013-11-09 LAB — VITAMIN D 25 HYDROXY (VIT D DEFICIENCY, FRACTURES): Vit D, 25-Hydroxy: 44 ng/mL (ref 30–89)

## 2013-11-17 NOTE — Progress Notes (Signed)
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   PAST THERAPY:  Bilateral mastectomy (Prophylactic on Right) on 11/14/2011    4 cycles FEC    4 cycles Taxotere   Radiation completed 08/22/2012  CURRENT THERAPY:  Arimidex, started 08/22/2012  INTERVAL HISTORY: [name] Riley 63 y.o. female returns for scheduled follow up of her hormonal therapy for her breast cancer. She began Arimidex in November, and has been tolerating it with minimal difficulty. 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   Past Medical History  Diagnosis Date  . Coronary artery disease   . Hyperlipidemia   . PONV (postoperative nausea and vomiting)     gets sick from anesthesia  . Hypothyroidism   . Cancer     left breast cancer  . Breast cancer 08/25/2011    L , invasive ductal carcinoma, ER/PR +,HER2 -  . Heart attack 09/2000    Sep 25, 2000  --no intervention  . Hypertension   . History of chemotherapy comp. 08/22/2012    4 cycles of FEC and $ cycles of Taxotere  . Status post radiation therapy 07/09/12 - 08/22/2012    Left Breast, 60.4 gray  . Allergy     Past Surgical History  Procedure Laterality Date  . Tonsillectomy    . Ovarian cyst surgery  1970  . Skin tags  05/09/1997    left axillary left neck skin tags  . Portacath placement  11/14/2011    Procedure: INSERTION PORT-A-CATH;  Surgeon: Imogene Burn. Tsuei, MD;  Location: [place];  Service: General;  Laterality: Right;  . Breast surgery  1998    removal of benign lump in rt breast  . Breast surgery  11/14/11    right simple mastectomy, left mrm  . Abdominal hysterectomy  1998    TAH, oophorectomy  . Appendectomy  1970   . Port-a-cath removal  08/30/2012    Procedure: REMOVAL PORT-A-CATH;  Surgeon: Imogene Burn. [name], MD;  Location: [place];  Service: General;  Laterality: Right;  port removal    Current Outpatient Prescriptions  Medication Sig Dispense Refill  . aspirin 81 MG tablet Take 81 mg by mouth daily.        . Cholecalciferol (VITAMIN D-3 PO) Take 2,000 Int'l Units by mouth daily.      Marland Kitchen HYDROcodone-acetaminophen (NORCO/VICODIN) 5-325 MG per tablet Take 1 tablet by mouth every 4 (four) hours as needed for pain.  40 tablet  0  . levothyroxine (SYNTHROID, LEVOTHROID) 75 MCG tablet Take 75 mcg by mouth daily.       . metoprolol (TOPROL-XL) 100 MG 24 hr tablet Take 100 mg by mouth daily.       . Multiple Vitamins-Minerals (MULTIVITAMIN WITH MINERALS) tablet Take 1 tablet by mouth daily.        Marland Kitchen anastrozole (ARIMIDEX) 1 MG tablet Take 1 tablet (1 mg total) by mouth daily.  90 tablet  3   No current facility-administered medications for this visit.    ALLERGIES:  is allergic to contrast media and food.  REVIEW OF SYSTEMS:  She has occasional hot flashes, and some mild joint pain but she relates she had the joint pain prior  to starting Arimidex. HEENT She specifically denied headaches, dizziness, blurred vision, or hallucinations. LUNGS: denies wheezing, cough or dyspnea CARDIAC: denies chest pain or pressure GI: denies nausea vomiting or diarrhea - admits to occasional constipation GU: no difficulty passing stool or urine, no blood in urine, no vaginal discharge MUSCULOSKELETAL: has underlying chronic joint pain ROS otherwise negative   Filed Vitals:   11/08/13 1353  BP: 170/89  Pulse: 85  Temp: 98 F (36.7 C)  Resp: 18   Wt Readings from Last 3 Encounters:  11/08/13 175 lb 8 oz (79.606 kg)  05/15/13 175 lb 14.4 oz (79.788 kg)  04/11/13 175 lb 12.8 oz (79.742 kg)   ECOG Performance status: 0  PHYSICAL EXAMINATION: 63 yr old white female who appears her stated age.    General:  well-nourished in no acute distress.  Eyes:  no scleral icterus.  ENT:  There were no oropharyngeal lesions.  Neck was without thyromegaly.  Lymphatics:  Negative cervical, supraclavicular or axillary adenopathy.  Respiratory: lungs were clear bilaterally without wheezing or crackles.  Cardiovascular:  Regular rate and rhythm, S1/S2, without murmur, rub or gallop.  There was no pedal edema.  Breast Exam: Bilateral mastectomy incisions are well healed. No redness of the skin or subcutaneous nodules. Left chest wall area has some dry skin  GI:  abdomen was soft, flat, nontender, nondistended, without organomegaly.  Musculoskeletal:  no spinal tenderness of palpation of vertebral spine.  Skin exam was without echymosis, petichae.  Neuro exam was nonfocal.  Patient was able to get on and off exam table without assistance.  Gait was normal.  Patient was alerted and oriented.  Attention was good.   Language was appropriate.  Mood was normal without depression.  Speech was not pressured.  Thought content was not tangential.     LABORATORY/RADIOLOGY DATA: No labs were obtained today but have been ordered prior to patient's next scheduled office visit. Lab Results  Component Value Date   WBC 6.8 11/08/2013   HGB 13.3 11/08/2013   HCT 38.6 11/08/2013   PLT 296 11/08/2013   GLUCOSE 126 11/08/2013   ALT 27 11/08/2013   AST 21 11/08/2013   NA 140 11/08/2013   K 3.7 11/08/2013   CL 103 08/30/2012   CREATININE 0.7 11/08/2013   BUN 14.6 11/08/2013   CO2 25 11/08/2013    ASSESSMENT AND PLAN: 63 year old white female with   #1T2 N1a Invasive ductal carcinoma of LEFT breast who is s/p bilateral mastectomy one year ago (3/18 nodes positive) She completed adjuvant chemo with  4 cycles FEC followed by 4 cycles Taxotere and completed radiation 08/22/2012. She began Arimidex 08/22/2012, and is tolerating it fine.   #2 patient will continue vitamin D and she is. I have also recommended she begin Os-Cal on a daily  basis.  #3 she'll be seen back in 6 months time for followup.

## 2014-03-20 ENCOUNTER — Encounter (INDEPENDENT_AMBULATORY_CARE_PROVIDER_SITE_OTHER): Payer: Self-pay | Admitting: Surgery

## 2014-04-23 ENCOUNTER — Telehealth: Payer: Self-pay | Admitting: Hematology and Oncology

## 2014-04-23 NOTE — Telephone Encounter (Signed)
, °

## 2014-05-02 ENCOUNTER — Telehealth: Payer: Self-pay | Admitting: Hematology and Oncology

## 2014-05-02 NOTE — Telephone Encounter (Signed)
RETURNED PT'S CALL AND CONFIRMED NEXT APPT FOR LB/VG 10/12. APPT R/S FROM JULY TO OCT DUE TO KK DEPARTURE.

## 2014-05-05 ENCOUNTER — Ambulatory Visit: Payer: BC Managed Care – PPO | Admitting: Oncology

## 2014-05-05 ENCOUNTER — Other Ambulatory Visit: Payer: BC Managed Care – PPO

## 2014-05-29 ENCOUNTER — Encounter: Payer: Self-pay | Admitting: Gastroenterology

## 2014-06-28 ENCOUNTER — Encounter: Payer: Self-pay | Admitting: Gastroenterology

## 2014-07-03 ENCOUNTER — Ambulatory Visit: Payer: PRIVATE HEALTH INSURANCE | Admitting: *Deleted

## 2014-07-03 VITALS — Ht 68.0 in | Wt 181.2 lb

## 2014-07-03 DIAGNOSIS — Z86010 Personal history of colonic polyps: Secondary | ICD-10-CM

## 2014-07-03 MED ORDER — MOVIPREP 100 G PO SOLR: ORAL | Status: DC

## 2014-07-03 NOTE — Progress Notes (Signed)
No allergies to eggs or soy. No problems with anesthesia.  Pt given Emmi instructions for colonoscopy  No oxygen use  No diet drug use  

## 2014-07-17 ENCOUNTER — Encounter: Payer: BC Managed Care – PPO | Admitting: Gastroenterology

## 2014-07-25 ENCOUNTER — Other Ambulatory Visit: Payer: Self-pay

## 2014-07-25 DIAGNOSIS — C50912 Malignant neoplasm of unspecified site of left female breast: Secondary | ICD-10-CM

## 2014-07-28 ENCOUNTER — Telehealth: Payer: Self-pay | Admitting: Hematology and Oncology

## 2014-07-28 ENCOUNTER — Other Ambulatory Visit (HOSPITAL_BASED_OUTPATIENT_CLINIC_OR_DEPARTMENT_OTHER): Payer: PRIVATE HEALTH INSURANCE

## 2014-07-28 ENCOUNTER — Ambulatory Visit (HOSPITAL_BASED_OUTPATIENT_CLINIC_OR_DEPARTMENT_OTHER): Payer: PRIVATE HEALTH INSURANCE | Admitting: Hematology and Oncology

## 2014-07-28 VITALS — BP 163/82 | HR 92 | Temp 98.5°F | Resp 18 | Ht 68.0 in | Wt 182.1 lb

## 2014-07-28 DIAGNOSIS — C50812 Malignant neoplasm of overlapping sites of left female breast: Secondary | ICD-10-CM

## 2014-07-28 DIAGNOSIS — C50912 Malignant neoplasm of unspecified site of left female breast: Secondary | ICD-10-CM

## 2014-07-28 DIAGNOSIS — C773 Secondary and unspecified malignant neoplasm of axilla and upper limb lymph nodes: Secondary | ICD-10-CM

## 2014-07-28 LAB — CBC WITH DIFFERENTIAL/PLATELET
BASO%: 0.8 % (ref 0.0–2.0)
Basophils Absolute: 0 10*3/uL (ref 0.0–0.1)
EOS%: 1.5 % (ref 0.0–7.0)
Eosinophils Absolute: 0.1 10*3/uL (ref 0.0–0.5)
HCT: 40.9 % (ref 34.8–46.6)
HGB: 13.5 g/dL (ref 11.6–15.9)
LYMPH%: 33.4 % (ref 14.0–49.7)
MCH: 30.2 pg (ref 25.1–34.0)
MCHC: 33 g/dL (ref 31.5–36.0)
MCV: 91.3 fL (ref 79.5–101.0)
MONO#: 0.5 10*3/uL (ref 0.1–0.9)
MONO%: 7.9 % (ref 0.0–14.0)
NEUT#: 3.5 10*3/uL (ref 1.5–6.5)
NEUT%: 56.4 % (ref 38.4–76.8)
Platelets: 278 10*3/uL (ref 145–400)
RBC: 4.48 10*6/uL (ref 3.70–5.45)
RDW: 13.5 % (ref 11.2–14.5)
WBC: 6.2 10*3/uL (ref 3.9–10.3)
lymph#: 2.1 10*3/uL (ref 0.9–3.3)

## 2014-07-28 LAB — COMPREHENSIVE METABOLIC PANEL (CC13)
ALT: 27 U/L (ref 0–55)
AST: 20 U/L (ref 5–34)
Albumin: 4.3 g/dL (ref 3.5–5.0)
Alkaline Phosphatase: 116 U/L (ref 40–150)
Anion Gap: 11 mEq/L (ref 3–11)
BUN: 13.3 mg/dL (ref 7.0–26.0)
CO2: 27 mEq/L (ref 22–29)
Calcium: 10.2 mg/dL (ref 8.4–10.4)
Chloride: 105 mEq/L (ref 98–109)
Creatinine: 0.7 mg/dL (ref 0.6–1.1)
Glucose: 98 mg/dl (ref 70–140)
Potassium: 3.1 mEq/L — ABNORMAL LOW (ref 3.5–5.1)
Sodium: 143 mEq/L (ref 136–145)
Total Bilirubin: 0.43 mg/dL (ref 0.20–1.20)
Total Protein: 7.8 g/dL (ref 6.4–8.3)

## 2014-07-28 NOTE — Assessment & Plan Note (Signed)
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 Currently on Arimidex since 08/22/2012  Surveillance: I reviewed the blood work results. Other than slight hypokalemia there are no abnormalities. Today's physical exam does not reveal any evidence of relapse breast cancer. Since she had bilateral mastectomies no role of imaging studies.  Aromatase inhibitor surveillance: We will set her up for a bone density test by the end of this year.  Return to clinic in 6 months for a blood work and followup.

## 2014-07-28 NOTE — Progress Notes (Signed)
Patient Care Team: [name], MD as PCP - General (Family Medicine)  DIAGNOSIS: Breast cancer, left breast   Primary site: Breast (Left)   Staging method: AJCC 7th Edition   Clinical: Stage IIB (T2, N1, cM0)   Summary: Stage IIB (T2, N1, cM0)   SUMMARY OF ONCOLOGIC HISTORY:   Breast cancer, left breast   11/14/2011 Surgery Bilateral mastectomy, prophylactic on the right, left breast IDC 3/18 lymph nodes positive with extracapsular extension ER 89%, PR 81%, HER-2 negative, Ki-67 79% T2 N1 A. stage IIB   12/13/2011 - 06/28/2012 Chemotherapy 4 cycles of FEC followed by 4 cycles of Taxotere   07/17/2012 - 08/22/2012 Radiation Therapy Adjuvant radiation therapy   08/22/2012 -  Anti-estrogen oral therapy Arimidex 1 mg daily    CHIEF COMPLIANT: Followup of breast cancer  INTERVAL HISTORY: [name] Riley is a 63 year old Caucasian with above-mentioned history of left-sided breast cancer. She had bilateral mastectomies and finished adjuvant chemoradiation. She is currently on Arimidex 1 mg daily since November 2013. She is tolerating it very well without any major problems other than some muscle stiffness and occasional hot flashes. She denies any lumps or nodules. She is walking and exercising every day. She is trying to stay active.  REVIEW OF SYSTEMS:   Constitutional: Denies fevers, chills or abnormal weight loss Eyes: Denies blurriness of vision Ears, nose, mouth, throat, and face: Denies mucositis or sore throat Respiratory: Denies cough, dyspnea or wheezes Cardiovascular: Denies palpitation, chest discomfort or lower extremity swelling Gastrointestinal:  Denies nausea, heartburn or change in bowel habits Skin: Denies abnormal skin rashes Lymphatics: Denies new lymphadenopathy or easy bruising Neurological:Denies numbness, tingling or new weaknesses Behavioral/Psych: Mood is stable, no new changes  All other systems were reviewed with the patient and are negative.  I have reviewed the  past medical history, past surgical history, social history and family history with the patient and they are unchanged from previous note.  ALLERGIES:  is allergic to contrast media and food.  MEDICATIONS:  Current Outpatient Prescriptions  Medication Sig Dispense Refill  . anastrozole (ARIMIDEX) 1 MG tablet Take 1 tablet (1 mg total) by mouth daily.  90 tablet  3  . aspirin 81 MG tablet Take 81 mg by mouth daily.        . Cholecalciferol (VITAMIN D-3 PO) Take 2,000 Int'l Units by mouth daily.      Marland Kitchen levothyroxine (SYNTHROID, LEVOTHROID) 75 MCG tablet Take 75 mcg by mouth daily.       . metoprolol (TOPROL-XL) 100 MG 24 hr tablet Take 100 mg by mouth daily.       . Multiple Vitamins-Minerals (MULTIVITAMIN WITH MINERALS) tablet Take 1 tablet by mouth daily.        Marland Kitchen MOVIPREP 100 G SOLR moviprep as directed. No substitutions  1 kit  0   No current facility-administered medications for this visit.    PHYSICAL EXAMINATION: ECOG PERFORMANCE STATUS: 0 - Asymptomatic  Filed Vitals:   07/28/14 1054  BP: 163/82  Pulse: 92  Temp: 98.5 F (36.9 C)  Resp: 18   Filed Weights   07/28/14 1054  Weight: 182 lb 1.6 oz (82.6 kg)    GENERAL:alert, no distress and comfortable SKIN: skin color, texture, turgor are normal, no rashes or significant lesions EYES: normal, Conjunctiva are pink and non-injected, sclera clear OROPHARYNX:no exudate, no erythema and lips, buccal mucosa, and tongue normal  NECK: supple, thyroid normal size, non-tender, without nodularity LYMPH:  no palpable lymphadenopathy in the cervical, axillary  or inguinal LUNGS: clear to auscultation and percussion with normal breathing effort HEART: regular rate & rhythm and no murmurs and no lower extremity edema ABDOMEN:abdomen soft, non-tender and normal bowel sounds Musculoskeletal:no cyanosis of digits and no clubbing  NEURO: alert & oriented x 3 with fluent speech, no focal motor/sensory deficits BREAST. No palpable axillary  supraclavicular or infraclavicular adenopathy no breast tenderness or nipple discharge.   LABORATORY DATA:  I have reviewed the data as listed   Chemistry      Component Value Date/Time   NA 143 07/28/2014 1038   NA 141 08/30/2012 0948   K 3.1* 07/28/2014 1038   K 3.8 08/30/2012 0948   CL 103 08/30/2012 0948   CL 105 08/10/2012 0908   CO2 27 07/28/2014 1038   CO2 27 05/17/2012 1006   BUN 13.3 07/28/2014 1038   BUN 13 08/30/2012 0948   CREATININE 0.7 07/28/2014 1038   CREATININE 0.70 08/30/2012 0948      Component Value Date/Time   CALCIUM 10.2 07/28/2014 1038   CALCIUM 10.2 05/17/2012 1006   ALKPHOS 116 07/28/2014 1038   ALKPHOS 88 05/17/2012 1006   AST 20 07/28/2014 1038   AST 23 05/17/2012 1006   ALT 27 07/28/2014 1038   ALT 28 05/17/2012 1006   BILITOT 0.43 07/28/2014 1038   BILITOT 0.4 05/17/2012 1006       Lab Results  Component Value Date   WBC 6.2 07/28/2014   HGB 13.5 07/28/2014   HCT 40.9 07/28/2014   MCV 91.3 07/28/2014   PLT 278 07/28/2014   NEUTROABS 3.5 07/28/2014     RADIOGRAPHIC STUDIES: I have personally reviewed the radiology reports and agreed with their findings. No results found.   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 Currently on Arimidex since 08/22/2012  Surveillance: I reviewed the blood work results. Other than slight hypokalemia there are no abnormalities. Today's physical exam does not reveal any evidence of relapse breast cancer. Since she had bilateral mastectomies no role of imaging studies.  Aromatase inhibitor surveillance: We will set her up for a bone density test by the end of this year.  Return to clinic in 6 months for a blood work and followup.    Orders Placed This Encounter  Procedures   . DG Bone Density    Standing Status: Future     Number of Occurrences:      Standing Expiration Date: 07/28/2015    Order Specific Question:  Reason for Exam (SYMPTOM  OR DIAGNOSIS REQUIRED)    Answer:  Postmenopausal on aromatase inhibitor    Order Specific Question:  Preferred imaging location?    Answer:  [facility]  . CBC with Differential    Standing Status: Future     Number of Occurrences:      Standing Expiration Date: 07/28/2015  . Comprehensive metabolic panel (Cmet) - [facility]    Standing Status: Future     Number of Occurrences:      Standing Expiration Date: 07/28/2015   The patient has a good understanding of the overall plan. she agrees with it. She will call with any problems that may develop before her next visit here.  I spent 20 minutes counseling the patient face to face. The total  time spent in the appointment was 25 minutes and more than 50% was on counseling and review of test results    Rulon Eisenmenger, MD 07/28/2014 11:33 AM

## 2014-07-28 NOTE — Telephone Encounter (Signed)
, °

## 2014-09-01 ENCOUNTER — Encounter: Payer: PRIVATE HEALTH INSURANCE | Admitting: Gastroenterology

## 2014-09-16 ENCOUNTER — Telehealth: Payer: Self-pay

## 2014-09-16 NOTE — Telephone Encounter (Signed)
Office note rcvd from [facility] dtd 09/16/14 Sherolyn Buba.  Copy to Dr Lindi Adie.  Original to scan.

## 2014-10-23 ENCOUNTER — Observation Stay (HOSPITAL_COMMUNITY): Payer: PRIVATE HEALTH INSURANCE

## 2014-10-23 ENCOUNTER — Encounter (HOSPITAL_COMMUNITY): Payer: Self-pay | Admitting: *Deleted

## 2014-10-23 ENCOUNTER — Observation Stay (HOSPITAL_COMMUNITY)
Admission: EM | Admit: 2014-10-23 | Discharge: 2014-10-24 | Disposition: A | Discharge status: Home / Self Care | Payer: PRIVATE HEALTH INSURANCE | Attending: Internal Medicine | Admitting: Internal Medicine

## 2014-10-23 DIAGNOSIS — R079 Chest pain, unspecified: Principal | ICD-10-CM | POA: Diagnosis present

## 2014-10-23 DIAGNOSIS — Z23 Encounter for immunization: Secondary | ICD-10-CM | POA: Diagnosis not present

## 2014-10-23 DIAGNOSIS — E876 Hypokalemia: Secondary | ICD-10-CM

## 2014-10-23 DIAGNOSIS — I25118 Atherosclerotic heart disease of native coronary artery with other forms of angina pectoris: Secondary | ICD-10-CM

## 2014-10-23 DIAGNOSIS — I10 Essential (primary) hypertension: Secondary | ICD-10-CM | POA: Diagnosis present

## 2014-10-23 DIAGNOSIS — Z7982 Long term (current) use of aspirin: Secondary | ICD-10-CM | POA: Insufficient documentation

## 2014-10-23 DIAGNOSIS — Z853 Personal history of malignant neoplasm of breast: Secondary | ICD-10-CM | POA: Insufficient documentation

## 2014-10-23 DIAGNOSIS — E039 Hypothyroidism, unspecified: Secondary | ICD-10-CM | POA: Diagnosis present

## 2014-10-23 DIAGNOSIS — Z79899 Other long term (current) drug therapy: Secondary | ICD-10-CM | POA: Insufficient documentation

## 2014-10-23 DIAGNOSIS — E785 Hyperlipidemia, unspecified: Secondary | ICD-10-CM | POA: Diagnosis not present

## 2014-10-23 DIAGNOSIS — I2510 Atherosclerotic heart disease of native coronary artery without angina pectoris: Secondary | ICD-10-CM | POA: Diagnosis present

## 2014-10-23 DIAGNOSIS — R002 Palpitations: Secondary | ICD-10-CM

## 2014-10-23 DIAGNOSIS — Z9221 Personal history of antineoplastic chemotherapy: Secondary | ICD-10-CM | POA: Insufficient documentation

## 2014-10-23 DIAGNOSIS — I252 Old myocardial infarction: Secondary | ICD-10-CM | POA: Insufficient documentation

## 2014-10-23 LAB — BASIC METABOLIC PANEL
Anion gap: 8 (ref 5–15)
BUN: 13 mg/dL (ref 6–23)
CO2: 25 mmol/L (ref 19–32)
Calcium: 9.5 mg/dL (ref 8.4–10.5)
Chloride: 106 mEq/L (ref 96–112)
Creatinine, Ser: 0.57 mg/dL (ref 0.50–1.10)
GFR calc Af Amer: >90 mL/min (ref >90)
GFR calc non Af Amer: >90 mL/min (ref >90)
Glucose, Bld: 119 mg/dL — ABNORMAL HIGH (ref 70–99)
Potassium: 3.3 mmol/L — ABNORMAL LOW (ref 3.5–5.1)
Sodium: 139 mmol/L (ref 135–145)

## 2014-10-23 LAB — CBC
HCT: 37.2 % (ref 36.0–46.0)
Hemoglobin: 12.6 g/dL (ref 12.0–15.0)
MCH: 30.2 pg (ref 26.0–34.0)
MCHC: 33.9 g/dL (ref 30.0–36.0)
MCV: 89.2 fL (ref 78.0–100.0)
Platelets: 252 10*3/uL (ref 150–400)
RBC: 4.17 MIL/uL (ref 3.87–5.11)
RDW: 12.6 % (ref 11.5–15.5)
WBC: 4.8 10*3/uL (ref 4.0–10.5)

## 2014-10-23 LAB — TROPONIN I
Troponin I: <0.03 ng/mL (ref <0.031)
Troponin I: <0.03 ng/mL (ref <0.031)

## 2014-10-23 LAB — I-STAT TROPONIN, ED: Troponin i, poc: 0 ng/mL (ref 0.00–0.08)

## 2014-10-23 MED ORDER — HEPARIN SODIUM (PORCINE) 5000 UNIT/ML IJ SOLN
5000.0000 [IU] | Freq: Three times a day (TID) | INTRAMUSCULAR | Status: DC
  Administered 2014-10-23 – 2014-10-24 (×3): 5000 [IU] via SUBCUTANEOUS
  Filled 2014-10-23 (×4): qty 1

## 2014-10-23 MED ORDER — ANASTROZOLE 1 MG PO TABS
1.0000 mg | ORAL_TABLET | Freq: Every day | ORAL | Status: DC
  Administered 2014-10-23 – 2014-10-24 (×2): 1 mg via ORAL
  Filled 2014-10-23 (×2): qty 1

## 2014-10-23 MED ORDER — MORPHINE SULFATE 2 MG/ML IJ SOLN: 1.0000 mg | INTRAMUSCULAR | Status: DC | PRN

## 2014-10-23 MED ORDER — LEVOTHYROXINE SODIUM 75 MCG PO TABS: 75.0000 ug | ORAL_TABLET | Freq: Every day | ORAL | Status: DC

## 2014-10-23 MED ORDER — INFLUENZA VAC SPLIT QUAD 0.5 ML IM SUSY
0.5000 mL | PREFILLED_SYRINGE | INTRAMUSCULAR | Status: AC
  Administered 2014-10-24: 0.5 mL via INTRAMUSCULAR
  Filled 2014-10-23: qty 0.5

## 2014-10-23 MED ORDER — LISINOPRIL 5 MG PO TABS
5.0000 mg | ORAL_TABLET | Freq: Every day | ORAL | Status: DC
  Administered 2014-10-23 – 2014-10-24 (×2): 5 mg via ORAL
  Filled 2014-10-23 (×2): qty 1

## 2014-10-23 MED ORDER — ACETAMINOPHEN 325 MG PO TABS
650.0000 mg | ORAL_TABLET | ORAL | Status: DC | PRN
  Administered 2014-10-24: 650 mg via ORAL
  Filled 2014-10-23: qty 2

## 2014-10-23 MED ORDER — ASPIRIN 81 MG PO CHEW
81.0000 mg | CHEWABLE_TABLET | Freq: Every day | ORAL | Status: DC
  Administered 2014-10-24: 81 mg via ORAL
  Filled 2014-10-23: qty 1

## 2014-10-23 MED ORDER — ONDANSETRON HCL 4 MG/2ML IJ SOLN: 4.0000 mg | Freq: Four times a day (QID) | INTRAMUSCULAR | Status: DC | PRN

## 2014-10-23 MED ORDER — POTASSIUM CHLORIDE CRYS ER 20 MEQ PO TBCR
40.0000 meq | EXTENDED_RELEASE_TABLET | Freq: Once | ORAL | Status: AC
  Administered 2014-10-23: 40 meq via ORAL
  Filled 2014-10-23: qty 2

## 2014-10-23 MED ORDER — METOPROLOL SUCCINATE ER 100 MG PO TB24: 100.0000 mg | ORAL_TABLET | Freq: Every day | ORAL | Status: DC

## 2014-10-23 NOTE — Consult Note (Signed)
CARDIOLOGY CONSULT NOTE   Patient ID: [name] Riley MRN: [id number] DOB/AGE: 62-Jun-1952 62 y.o.  Admit date: 10/23/2014  Primary Physician   Sherrie Mustache, MD Primary Cardiologist   Dr. Percival Spanish Reason for Consultation   Chest pain  [name] Riley is a 64 y.o. female with a history of CAD. She has done well after her MI, was released by Dr Percival Spanish. She is active around the house and yard, without chest pain or SOB.   She was watering the horse this am and did well. After that, she had onset of aching substernal chest pain, radiated to the right. It reached a 4/10, started about 8 am. She did not take any medications, drove to work. The symptoms were intermittent, each episode lasting 5-15 minutes. 8-10 episodes total.   At work, her symptoms continued and her BP was high 175/95 at first and then 175/110 by EMS. EMS gave her SL NTG x 1 and ASA (total 324 mg for the day). The pain resolved and has not returned.   The symptoms she had with her MI were tingling in both shoulders and up into her neck. The symptoms today are unlike her MI. She has not had recent exertional symptoms and has never had symptoms like her MI since 2001. The symptoms today are new for her, she has never had these before.  Past Medical History  Diagnosis Date  . Coronary artery disease   . Hyperlipidemia   . PONV (postoperative nausea and vomiting)     gets sick from anesthesia  . Hypothyroidism   . Cancer     left breast cancer  . Breast cancer 08/25/2011    L , invasive ductal carcinoma, ER/PR +,HER2 -  . Heart attack 09/2000    Sep 25, 2000  --no intervention  . Hypertension   . History of chemotherapy comp. 08/22/2012    4 cycles of FEC and $ cycles of Taxotere  . Status post radiation therapy 07/09/12 - 08/22/2012    Left Breast, 60.4 gray  . Allergy      Past Surgical History  Procedure Laterality Date  . Tonsillectomy  1968  . Ovarian cyst surgery Right 1970  . Skin tags   05/09/1997    left axillary left neck skin tags  . Portacath placement  11/14/2011    Procedure: INSERTION PORT-A-CATH;  Surgeon: Imogene Burn. Tsuei, MD;  Location: [facility];  Service: General;  Laterality: Right;  . Breast surgery  1998    removal of benign lump in rt breast  . Breast surgery  11/14/11    right simple mastectomy, left mrm  . Abdominal hysterectomy  1998    TAH, oophorectomy  . Appendectomy  1970  . Port-a-cath removal  08/30/2012    Procedure: REMOVAL PORT-A-CATH;  Surgeon: Imogene Burn. [name], MD;  Location: [facility];  Service: General;  Laterality: Right;  port removal    Allergies  Allergen Reactions  . Contrast Media [Iodinated Diagnostic Agents] Shortness Of Breath and Rash  . Food Shortness Of Breath    Cantaloupe    I have reviewed the patient's current medications . anastrozole  1 mg Oral Daily  . [START ON 10/24/2014] aspirin  81 mg Oral Daily  . heparin  5,000 Units Subcutaneous 3 times per day  . [START ON 10/24/2014] levothyroxine  75 mcg Oral QAC breakfast  . [START ON 10/24/2014] metoprolol succinate  100 mg Oral Daily     acetaminophen, morphine  injection, ondansetron (ZOFRAN) IV  Prior to Admission medications   Medication Sig Start Date End Date Taking? Authorizing Provider  anastrozole (ARIMIDEX) 1 MG tablet Take 1 tablet (1 mg total) by mouth daily. 11/08/13  Yes Deatra Robinson, MD  aspirin 81 MG tablet Take 81 mg by mouth daily.     Yes Historical Provider, MD  Cholecalciferol (VITAMIN D-3 PO) Take 2,000 Int'l Units by mouth daily.   Yes Historical Provider, MD  levothyroxine (SYNTHROID, LEVOTHROID) 75 MCG tablet Take 75 mcg by mouth daily.    Yes Historical Provider, MD  Meth-Hyo-M Bl-Na Phos-Ph Sal (URIBEL) 118 MG CAPS Take 1 capsule by mouth 4 (four) times daily.   Yes Historical Provider, MD  metoprolol (TOPROL-XL) 100 MG 24 hr tablet Take 100 mg by mouth daily.    Yes Historical Provider, MD  Multiple Vitamins-Minerals (MULTIVITAMIN  WITH MINERALS) tablet Take 1 tablet by mouth daily.     Yes Historical Provider, MD  MOVIPREP 100 G SOLR moviprep as directed. No substitutions Patient not taking: Reported on 10/23/2014 07/03/14   Ladene Artist, MD     History   Social History  . Marital Status: Married    Spouse Name: N/A    Number of Children: 3  . Years of Education: N/A   Occupational History  . Works at [employer]  . Smoking status: Never Smoker   . Smokeless tobacco: Never Used  . Alcohol Use: No  . Drug Use: No  . Sexual Activity: Yes    Birth Control/ Protection: Surgical     Comment: menarche 42, Parity age 55, G9, P3, 1 miscarriage,  HRT x 5-10 yrs, Mild Hot Flashes   Other Topics Concern  . Not on file   Social History Narrative   Lives at home.    Family Status  Relation Status Death Age  . Maternal Grandmother Deceased   . Father Deceased   . Mother Alive   . Maternal Grandfather Deceased   . Brother Alive   . Brother Alive    Family History  Problem Relation Age of Onset  . Hypertension Maternal Grandmother   . Diabetes Maternal Grandmother   . Cancer Father 56    lung cancer and Prostate Cancer  . Hypertension Mother   . Cancer Paternal Aunt     ovarian  . Cancer Cousin     breast, paternal cousin  . Cancer Paternal Uncle     stomach  . Cancer Paternal Grandfather     Esophagus  . Colon cancer Neg Hx      ROS:  Full 14 point review of systems complete and found to be negative unless listed above.  Physical Exam: Blood pressure 188/74, pulse 90, temperature 98.2 F (36.8 C), temperature source Oral, resp. rate 16, height _0  (1.727 m), weight 177 lb (80.287 kg), SpO2 100 %.  General: Well developed, well nourished, female in no acute distress Head: Eyes PERRLA, No xanthomas.   Normocephalic and atraumatic, oropharynx without edema or exudate. Dentition: good Lungs: CTA bilaterally; bilateral mastectomy scars are well-healed Heart:  HRRR S1 S2, no rub/gallop, no murmur. pulses are 2+ all 4 extrem.   Neck: No carotid bruits. No lymphadenopathy.  JVD not elevated. Abdomen: Bowel sounds present, abdomen soft and non-tender without masses or hernias noted. Msk:  No spine or cva tenderness. No weakness, no joint deformities or effusions. Extremities: No clubbing or cyanosis. No edema.  Neuro: Alert  and oriented X 3. No focal deficits noted. Psych:  Good affect, responds appropriately Skin: No rashes or lesions noted.  Labs:   Lab Results  Component Value Date   WBC 4.8 10/23/2014   HGB 12.6 10/23/2014   HCT 37.2 10/23/2014   MCV 89.2 10/23/2014   PLT 252 10/23/2014    Recent Labs Lab 10/23/14 1156  NA 139  K 3.3*  CL 106  CO2 25  BUN 13  CREATININE 0.57  CALCIUM 9.5  GLUCOSE 119*   Recent Labs  10/23/14 1225  TROPIPOC 0.00   Echo: 12/15/2011 - Left ventricle: Systolic function was normal. The estimated ejection fraction was in the range of 55% to 60%. Wall motion was normal; there were no regional wall motion abnormalities. Doppler parameters are consistent with abnormal left ventricular relaxation (grade 1 diastolic dysfunction). - Aortic valve: There was no stenosis. Trivial regurgitation. - Mitral valve: Mild regurgitation. - Left atrium: The atrium was at the upper limits of normal in size. - Right ventricle: The cavity size was normal. Systolic function was normal. - Pulmonary arteries: No complete TR doppler jet so unable to estimate PA systolic pressure. - Inferior vena cava: The vessel was normal in size; the respirophasic diameter changes were in the normal range (= 50%); findings are consistent with normal central venous pressure. Impressions: - Normal LV size and systolic function, EF 73-22%. Normal RV size and systolic function. No significant valvular dysfunction.  ECG:  10/23/2014 SR, borderline LVH, no acute ischemic changes. Rate 74  Cardiac  Cath: 09/26/2000 CORONARIES: LEFT MAIN: The left main was normal. LEFT ANTERIOR DESCENDING: The LAD was normal in its proximal segment. However, in the mid segment there was a dissection plane with subsequent occlusion of the distal and apical LAD. CIRCUMFLEX: The circumflex was normal. RIGHT CORONARY ARTERY: The right coronary artery was a dominant normal vessel. LEFT VENTRICULOGRAM: The left ventriculogram was obtained in the RAO projection and the LAO projection. The ejection fraction was approximately 55% with apical akinesis. CONCLUSIONS: Severe single vessel coronary artery disease with mid left anterior descending dissection. PLAN: We will discuss possibility of percutaneous revascularization of the LAD versus medical management.  Radiology:  No results found.  ASSESSMENT AND PLAN:   The patient was seen today by Dr. Bronson Ing, the patient evaluated and the data reviewed.  Principal Problem:   Chest pain - atypical, but has a history of CAD, pre-MI symptoms were also atypical. Agree with admit, cycle enzymes overnight. Can do MV in am, cath if + for ischemia. Distal LAD known occluded, but EF nl by echo 2013.   Active Problems:   Hypothyroidism - per IM    Essential hypertension - per IM, SBP 131-188 since admit, has grade 1 diastolic dysfunction on echo, BP likely needs better control    Coronary atherosclerosis - see above.    Hypokalemia - supplemented by IM  Signed: Rosaria Ferries, PA-C 10/23/2014 6:33 PM Beeper [phone]  Co-Sign MD

## 2014-10-23 NOTE — H&P (Signed)
Date: 10/23/2014               Patient Name:  [name] Riley MRN: [id number]  DOB: December 05, 1950 Age / Sex: 64 y.o., female   PCP: [name], MD         Medical Service: Internal Medicine Teaching Service         Attending Physician: Dr. Sid Falcon, MD    First Contact: Crecencio Mc, MS4 Pager: [phone]  Second Contact: Dr. 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 The pain is described as dull to sharp, lasting for a few minutes, radiating to her right arm. She had no diaphoresis, N/V, tingling sensation associated with the pain but did feel "flushed". She describes the pain as different from what she experience when she had her MI years ago. She reports having a normal cath years ago surrounding the time she had her MI and had been "dismissed" by her Cardiologist with no recent stress tests and no recurrent chest pain since then until today.  Her pain resolved with 4 ASA 45m and one SL nitroglycerin (given her EMS en route to the ED) with no chest pain on arrival to the ED.  She notes that she has been under great stress recently, especially with the death of her sister-in-law from a massive MI.    Meds:  Medications Prior to Admission  Medication Sig Dispense Refill  . anastrozole (ARIMIDEX) 1 MG tablet Take 1 tablet (1 mg total) by mouth daily. 90 tablet 3  . aspirin 81 MG tablet Take 81 mg by mouth daily.      . Cholecalciferol (VITAMIN D-3 PO) Take 2,000 Int'l Units by mouth daily.    .Marland Kitchenlevothyroxine (SYNTHROID, LEVOTHROID) 75 MCG tablet  Take 75 mcg by mouth daily.     . Meth-Hyo-M Bl-Na Phos-Ph Sal (URIBEL) 118 MG CAPS Take 1 capsule by mouth 4 (four) times daily.    . metoprolol (TOPROL-XL) 100 MG 24 hr tablet Take 100 mg by mouth daily.     . Multiple Vitamins-Minerals (MULTIVITAMIN WITH MINERALS) tablet Take 1 tablet by mouth daily.      .Marland KitchenMOVIPREP 100 G SOLR moviprep as directed. No substitutions (Patient not taking: Reported on 10/23/2014) 1 kit 0   Current Facility-Administered Medications  Medication Dose Route Frequency Provider Last Rate Last Dose  . acetaminophen (TYLENOL) tablet 650 mg  650 mg Oral Q4H PRN SBlain Pais MD      . anastrozole (ARIMIDEX) tablet 1 mg  1 mg Oral Daily SBlain Pais MD   1 mg at 10/23/14 1705  . [START ON 10/24/2014] aspirin chewable tablet 81 mg  81 mg Oral Daily SBlain Pais MD      . heparin injection 5,000 Units  5,000 Units Subcutaneous 3 times per day SBlain Pais MD   5,000 Units at 10/23/14 1705  . [START ON 10/24/2014] Influenza vac split quadrivalent PF (FLUARIX) injection 0.5 mL  0.5 mL Intramuscular Tomorrow-1000 ESid Falcon MD      . [  START ON 10/24/2014] levothyroxine (SYNTHROID, LEVOTHROID) tablet 75 mcg  75 mcg Oral QAC breakfast Blain Pais, MD      . lisinopril (PRINIVIL,ZESTRIL) tablet 5 mg  5 mg Oral Daily Evelene Croon Barrett, PA-C   5 mg at 10/23/14 2023  . morphine 2 MG/ML injection 1 mg  1 mg Intravenous Q2H PRN Blain Pais, MD      . ondansetron [facility]) injection 4 mg  4 mg Intravenous Q6H PRN Blain Pais, MD        Allergies: Allergies as of 10/23/2014 - Review Complete 10/23/2014  Allergen Reaction Noted  . Contrast media [iodinated diagnostic agents] Shortness Of Breath and Rash 08/23/2011  . Food Shortness Of Breath 12/16/2011   Past Medical History  Diagnosis Date  . Coronary artery disease 2001  . Hyperlipidemia   . PONV (postoperative nausea and vomiting)     gets sick from anesthesia  .  Hypothyroidism   . Cancer     left breast cancer  . Breast cancer 08/25/2011    L , invasive ductal carcinoma, ER/PR +,HER2 -  . Heart attack 09/2000    Sep 25, 2000  --no intervention  . Hypertension   . History of chemotherapy comp. 08/22/2012    4 cycles of FEC and $ cycles of Taxotere  . Status post radiation therapy 07/09/12 - 08/22/2012    Left Breast, 60.4 gray  . Allergy    Past Surgical History  Procedure Laterality Date  . Tonsillectomy  1968  . Ovarian cyst surgery Right 1970  . Skin tags  05/09/1997    left axillary left neck skin tags  . Portacath placement  11/14/2011    Procedure: INSERTION PORT-A-CATH;  Surgeon: Imogene Burn. Tsuei, MD;  Location: [place];  Service: General;  Laterality: Right;  . Breast surgery  1998    removal of benign lump in rt breast  . Breast surgery  11/14/11    right simple mastectomy, left mrm  . Abdominal hysterectomy  1998    TAH, oophorectomy  . Appendectomy  1970  . Port-a-cath removal  08/30/2012    Procedure: REMOVAL PORT-A-CATH;  Surgeon: Imogene Burn. [name], MD;  Location: [place];  Service: General;  Laterality: Right;  port removal   Family History  Problem Relation Age of Onset  . Hypertension Maternal Grandmother   . Diabetes Maternal Grandmother   . Cancer Father 50    lung cancer and Prostate Cancer  . Hypertension Mother   . Cancer Paternal Aunt     ovarian  . Cancer Cousin     breast, paternal cousin  . Cancer Paternal Uncle     stomach  . Cancer Paternal Grandfather     Esophagus  . Colon cancer Neg Hx    History   Social History  . Marital Status: Married    Spouse Name: N/A    Number of Children: 3  . Years of Education: N/A   Occupational History  . Works at [employer]  . Smoking status: Never Smoker   . Smokeless tobacco: Never Used  . Alcohol Use: No  . Drug Use: No  . Sexual Activity: Yes    Birth Control/ Protection: Surgical     Comment:  menarche 5, Parity age 30, G31, P3, 1 miscarriage,  HRT x 5-10 yrs, Mild Hot Flashes   Other Topics Concern  . Not on file   Social History Narrative  Lives at home.    Review of Systems: Review of Systems  Constitutional: Negative for fever, chills, malaise/fatigue and diaphoresis.  HENT: Negative for congestion and sore throat.   Respiratory: Negative for cough, sputum production and shortness of breath.   Cardiovascular: Positive for chest pain and palpitations. Negative for orthopnea and leg swelling.  Gastrointestinal: Negative for nausea, vomiting, abdominal pain and diarrhea.  Genitourinary: Negative for dysuria and urgency.  Musculoskeletal: Negative for myalgias.  Skin: Negative for rash.  Neurological: Negative for dizziness, focal weakness, loss of consciousness and weakness.  Psychiatric/Behavioral: Negative for depression. The patient is nervous/anxious.     Physical Exam: Blood pressure 138/58, pulse 72, temperature 97.8 F (36.6 C), temperature source Oral, resp. rate 16, height 5' 8" (1.727 m), weight 177 lb (80.287 kg), SpO2 99 %.   Physical Exam  Nursing note and vitals reviewed. Constitutional: She appears well-developed and well-nourished. No distress.  HENT:  Head: Normocephalic.  Mouth/Throat: Oropharynx is clear and moist. No oropharyngeal exudate.  Eyes: Conjunctivae and EOM are normal. Pupils are equal, round, and reactive to light. Right eye exhibits no discharge. Left eye exhibits no discharge. No scleral icterus.  Neck: Neck supple. No JVD present.  Cardiovascular: Normal rate and regular rhythm.   Respiratory: Effort normal and breath sounds normal. No respiratory distress. She has no wheezes. She has no rales.  GI: Soft. Bowel sounds are normal. She exhibits no distension. There is no tenderness. There is no rebound and no guarding.  Musculoskeletal: She exhibits edema. She exhibits no tenderness.  Trace pitting edema bilaterally up to her knees    Neurological: She is alert.  Skin: Skin is warm and dry. No rash noted. She is not diaphoretic. No erythema.  Psychiatric: She has a normal mood and affect. Her behavior is normal.    Lab results: Basic Metabolic Panel:  Recent Labs  10/23/14 1156  NA 139  K 3.3*  CL 106  CO2 25  GLUCOSE 119*  BUN 13  CREATININE 0.57  CALCIUM 9.5   CBC:  Recent Labs  10/23/14 1156  WBC 4.8  HGB 12.6  HCT 37.2  MCV 89.2  PLT 252   Cardiac Enzymes:  Recent Labs  10/23/14 1842  TROPONINI <0.03    Imaging results:  No results found.  Other results: ED ECG REPORT   Date: 10/23/2014  EKG Time: 11:15 am  Rate: 74  Rhythm: normal sinus rhythm   Axis: Borderline left deviation  Intervals:normal PR, normal QTc  ST&T Change: No significant ST changes  Narrative Interpretation: Unchanged since last tracing    Assessment & Plan by Problem: 79 yr woman with PMH of CAD, previous MI, HTN, presenting with chest pain.   Chest pain: Heart score of 3. PNA unlikely with no fever/cough, negative CXR. Pneumothorax no likely per CXR, normal O2 saturation. Esophageal rupture unlikely with no hx of N/V, unremarkable CXR. Aortic dissection unlikely with no tearing chest pain or back pain, nl CXR. GERD possible but unlikely since she is on PPI already. PE unlikely with Wells/Geneva score of zero. ACS is possible given her history of MI with cardiac arrest and risk factors: HTN, hyperlipidemia. EKG with no acute changes, troponin x1 negative. Anxiety also possible given recent loss in her family but a diagnoses of exclusion.  -Admit to telemetry -ACS protocol: ASA 26m daily, morphine PRN, O2 supplementation PRN, currently cp free but may need nitroglycerin SL -cycle troponins -Repeat EKG in am and as needed if has recurrent CP -  Cardiology consult -Nuclear stress test in am--NPO after midnight, hold BB for this test -Risk stratification: lipid panel in am, HgA1c  HTN: Controlled at home on  metoprolol 126m XL.  -Hold BB for nuclear stress test -Monitor BP, may need give Norvasc tomorrow if BP >180/100  Hypokalemia: K of 3.3 on presentation, unclear etiology but perhaps a low potassium diet at home.  -Replete as needed -Check BMET in am -Encourage potassium dietary intake  Hyperlipidemia: Pt stopped statin months ago on her own because her "cholesterol was good" while on statin.  -F/u on lipid panel, start statin   Hypothyroidism: Continue home synthroid 785m -Check TSH  DVT prophylaxis: Heparin [place]  FEN: NSL Repeat BMET in am Heart healthy, NPO after midnight   Dispo: Disposition is deferred at this time, awaiting improvement of current medical problems. Anticipated discharge in approximately 1-2 day(s).   The patient does have a current PCP (LDione HousekeeperMD) and [facility].  The patient does not have transportation limitations that hinder transportation to clinic appointments.  Signed: SoBlain PaisMD  IMTS. PGY3 10/23/2014, 9:04 PM

## 2014-10-23 NOTE — ED Notes (Signed)
Pt from home via EMS for intermittent CP. Resolved after 1 nitro en route. Initial BP 190/120. Hx of MI with a cardiac arrest.   Is pain free now.

## 2014-10-23 NOTE — ED Provider Notes (Addendum)
CSN: [id number]     Arrival date & time 10/23/14  1105 History   First MD Initiated Contact with Patient 10/23/14 1120     Chief Complaint  Patient presents with  . Chest Pain     (Consider location/radiation/quality/duration/timing/severity/associated sxs/prior Treatment) HPI Complains of chest pain onset last night intermittent lasting 15 minutes at a time. Pain radiates to right arm. She was brought by EMS. Patient took 243 mg of aspirin prior to calling 911 this morning. EMS administered aspirin 81 mg and one sublingual nitroglycerin prior to arrival. Patient presently asymptomatic, pain-free she denies associated shortness of breath nausea sweatiness or other symptoms. Symptoms do not resemble pain from "heart attack" she had in 2001 which was tingling in her chest and neck. Past Medical History  Diagnosis Date  . Coronary artery disease   . Hyperlipidemia   . PONV (postoperative nausea and vomiting)     gets sick from anesthesia  . Hypothyroidism   . Cancer     left breast cancer  . Breast cancer 08/25/2011    L , invasive ductal carcinoma, ER/PR +,HER2 -  . Heart attack 09/2000    Sep 25, 2000  --no intervention  . Hypertension   . History of chemotherapy comp. 08/22/2012    4 cycles of FEC and $ cycles of Taxotere  . Status post radiation therapy 07/09/12 - 08/22/2012    Left Breast, 60.4 gray  . Allergy    Past Surgical History  Procedure Laterality Date  . Tonsillectomy  1968  . Ovarian cyst surgery Right 1970  . Skin tags  05/09/1997    left axillary left neck skin tags  . Portacath placement  11/14/2011    Procedure: INSERTION PORT-A-CATH;  Surgeon: Imogene Burn. Tsuei, MD;  Location: [place];  Service: General;  Laterality: Right;  . Breast surgery  1998    removal of benign lump in rt breast  . Breast surgery  11/14/11    right simple mastectomy, left mrm  . Abdominal hysterectomy  1998    TAH, oophorectomy  . Appendectomy  1970  . Port-a-cath removal  08/30/2012   Procedure: REMOVAL PORT-A-CATH;  Surgeon: Imogene Burn. [name], MD;  Location: [place];  Service: General;  Laterality: Right;  port removal   Family History  Problem Relation Age of Onset  . Hypertension Maternal Grandmother   . Diabetes Maternal Grandmother   . Cancer Father 41    lung cancer and Prostate Cancer  . Hypertension Mother   . Cancer Paternal Aunt     ovarian  . Cancer Cousin     breast, paternal cousin  . Cancer Paternal Uncle     stomach  . Cancer Paternal Grandfather     Esophagus  . Colon cancer Neg Hx    History  Substance Use Topics  . Smoking status: Never Smoker   . Smokeless tobacco: Never Used  . Alcohol Use: No   OB History    Gravida Para Term Preterm AB TAB SAB Ectopic Multiple Living   4    1  1   3      Review of Systems  Constitutional: Negative.   HENT: Negative.   Respiratory: Negative.   Cardiovascular: Positive for chest pain.  Gastrointestinal: Negative.   Musculoskeletal: Negative.   Skin: Negative.   Neurological: Negative.   Psychiatric/Behavioral: Negative.   All other systems reviewed and are negative.     Allergies  Contrast media and Food  Home Medications  Prior to Admission medications   Medication Sig Start Date End Date Taking? Authorizing Provider  anastrozole (ARIMIDEX) 1 MG tablet Take 1 tablet (1 mg total) by mouth daily. 11/08/13   Deatra Robinson, MD  aspirin 81 MG tablet Take 81 mg by mouth daily.      Historical Provider, MD  Cholecalciferol (VITAMIN D-3 PO) Take 2,000 Int'l Units by mouth daily.    Historical Provider, MD  levothyroxine (SYNTHROID, LEVOTHROID) 75 MCG tablet Take 75 mcg by mouth daily.     Historical Provider, MD  metoprolol (TOPROL-XL) 100 MG 24 hr tablet Take 100 mg by mouth daily.     Historical Provider, MD  MOVIPREP 100 G SOLR moviprep as directed. No substitutions 07/03/14   Ladene Artist, MD  Multiple Vitamins-Minerals (MULTIVITAMIN WITH MINERALS) tablet Take 1 tablet  by mouth daily.      Historical Provider, MD   BP 145/77 mmHg  Pulse 75  Temp(Src) 99.1 F (37.3 C) (Oral)  Resp 18  SpO2 96% Physical Exam  Constitutional: She appears well-developed and well-nourished.  HENT:  Head: Normocephalic and atraumatic.  Eyes: Conjunctivae are normal. Pupils are equal, round, and reactive to light.  Neck: Neck supple. No tracheal deviation present. No thyromegaly present.  Cardiovascular: Normal rate and regular rhythm.   No murmur heard. Pulmonary/Chest: Effort normal and breath sounds normal.  Abdominal: Soft. Bowel sounds are normal. She exhibits no distension. There is no tenderness.  Musculoskeletal: Normal range of motion. She exhibits no edema or tenderness.  Neurological: She is alert. Coordination normal.  Skin: Skin is warm and dry. No rash noted.  Psychiatric: She has a normal mood and affect.  Nursing note and vitals reviewed.   ED Course  Procedures (including critical care time) Labs Review Labs Reviewed - No data to display  Imaging Review No results found.   EKG Interpretation   Date/Time:  Thursday October 23 2014 11:15:18 EST Ventricular Rate:  74 PR Interval:  159 QRS Duration: 90 QT Interval:  418 QTC Calculation: 464 R Axis:   -41 Text Interpretation:  Sinus rhythm Probable left ventricular hypertrophy  No significant change since last tracing Confirmed by Saroya Riccobono  MD, Emonni Depasquale  ([id number]) on 10/23/2014 11:31:34 AM     1:30 PM patient remains asymptomatic pain-free. Potassium chloride ordered orally to treat hypokalemia Results for orders placed or performed during the hospital encounter of 65/99/35  Basic metabolic panel  Result Value Ref Range   Sodium 139 135 - 145 mmol/L   Potassium 3.3 (L) 3.5 - 5.1 mmol/L   Chloride 106 96 - 112 mEq/L   CO2 25 19 - 32 mmol/L   Glucose, Bld 119 (H) 70 - 99 mg/dL   BUN 13 6 - 23 mg/dL   Creatinine, Ser 0.57 0.50 - 1.10 mg/dL   Calcium 9.5 8.4 - 10.5 mg/dL   GFR calc non Af Amer  >90 >90 mL/min   GFR calc Af Amer >90 >90 mL/min   Anion gap 8 5 - 15  CBC  Result Value Ref Range   WBC 4.8 4.0 - 10.5 K/uL   RBC 4.17 3.87 - 5.11 MIL/uL   Hemoglobin 12.6 12.0 - 15.0 g/dL   HCT 37.2 36.0 - 46.0 %   MCV 89.2 78.0 - 100.0 fL   MCH 30.2 26.0 - 34.0 pg   MCHC 33.9 30.0 - 36.0 g/dL   RDW 12.6 11.5 - 15.5 %   Platelets 252 150 - 400 K/uL  I-stat troponin, ED (  not at [facility])  Result Value Ref Range   Troponin i, poc 0.00 0.00 - 0.08 ng/mL   Comment 3           No results found.  MDM   Heart score +4.patient has not been followed by a cardiologist since 2004 Spoke with Dr. Hayes Ludwig plan 23 hour observation, telemetry Final diagnoses:  None   Diagnosis #1 chest pain #2 hypokalemia     Orlie Dakin, MD 10/23/14 [place], MD 10/23/14 [place], MD 10/23/14 1346

## 2014-10-23 NOTE — H&P (Signed)
Date: 10/23/2014               Patient Name:  [name] Riley MRN: [id number]  DOB: 06/17/1951 Age / Sex: 64 y.o., female   PCP: [name], MD         Medical Service: Internal Medicine Teaching Service         Attending Physician: Dr. Sid Falcon, MD    First Contact: Crecencio Mc, MS4 Pager: [phone]  Second Contact: Dr. Bing Neighbors Pager: [phone]       After Hours (After 5p/  First Contact Pager: [phone]  weekends / holidays): Second Contact Pager: [phone]   Chief Complaint: Chest Pain  History of Present Illness: [name] Riley is a 64 year old woman with a past history of CAD, MI, HTN, hypothyroidism, hyperlipidemia, and stage IIB breast cancer who presented to the [facility] ED today for complaints of chest pain.   Patient states she was in her usual state of health until early this morning, when she began noticing intermittent pain in her chest. She states that the pain was mainly in the center of her chest, and each episode of pain would last for several minutes at a time. She says the type of pain was hard to describe, but she had an overwhelming feeling that "something wasn't right."   The pain did not radiate anywhere else from her chest. She denied any shortness of breath or trouble breathing, dizziness, lightheadedness, nausea, vomiting, diarrhea, abdominal pain, or sweating, or numbness and tingling sensations anywhere in her body. She states that this does not feel like her prior MI, at which time she experienced numbness and tingling that radiated from her chest to her neck.   She says the pain resolved after receiving a dose of nitroglycerine from the EMTs on the way to the hospital.    She does state that her right arm was also hurting this morning, but she states that she has been having pain in that arm for several days.  Of note, the patient reports that her sister-in-law died of a sudden heart attack just before Christmas, and she has been dealing with a lot  of "stress and anxiety" since then. She also reports previous "anxiety attacks" after her own heart attack in 2001 where she thought she was having chest pain and went to the hospital for evaluation.    Review of Systems: Pertinent items are noted in HPI.  Meds: Current Facility-Administered Medications  Medication Dose Route Frequency Provider Last Rate Last Dose  . acetaminophen (TYLENOL) tablet 650 mg  650 mg Oral Q4H PRN Blain Pais, MD      . anastrozole (ARIMIDEX) tablet 1 mg  1 mg Oral Daily Blain Pais, MD      . Derrill Memo ON 10/24/2014] aspirin chewable tablet 81 mg  81 mg Oral Daily Blain Pais, MD      . heparin injection 5,000 Units  5,000 Units Subcutaneous 3 times per day Blain Pais, MD      . Derrill Memo ON 10/24/2014] levothyroxine (SYNTHROID, LEVOTHROID) tablet 75 mcg  75 mcg Oral QAC breakfast Blain Pais, MD      . Derrill Memo ON 10/24/2014] metoprolol succinate (TOPROL-XL) 24 hr tablet 100 mg  100 mg Oral Daily Blain Pais, MD      . morphine 2 MG/ML injection 1 mg  1 mg Intravenous Q2H PRN Blain Pais, MD      . ondansetron [facility]) injection 4 mg  4 mg Intravenous Q6H PRN Blain Pais, MD        Allergies: Allergies as of 10/23/2014 - Review Complete 10/23/2014  Allergen Reaction Noted  . Contrast media [iodinated diagnostic agents] Shortness Of Breath and Rash 08/23/2011  . Food Shortness Of Breath 12/16/2011   Past Medical History  Diagnosis Date  . Coronary artery disease   . Hyperlipidemia   . PONV (postoperative nausea and vomiting)     gets sick from anesthesia  . Hypothyroidism   . Cancer     left breast cancer  . Breast cancer 08/25/2011    L , invasive ductal carcinoma, ER/PR +,HER2 -  . Heart attack 09/2000    Sep 25, 2000  --no intervention  . Hypertension   . History of chemotherapy comp. 08/22/2012    4 cycles of FEC and $ cycles of Taxotere  . Status post radiation therapy 07/09/12 - 08/22/2012     Left Breast, 60.4 gray  . Allergy    Past Surgical History  Procedure Laterality Date  . Tonsillectomy  1968  . Ovarian cyst surgery Right 1970  . Skin tags  05/09/1997    left axillary left neck skin tags  . Portacath placement  11/14/2011    Procedure: INSERTION PORT-A-CATH;  Surgeon: Imogene Burn. Tsuei, MD;  Location: [facility];  Service: General;  Laterality: Right;  . Breast surgery  1998    removal of benign lump in rt breast  . Breast surgery  11/14/11    right simple mastectomy, left mrm  . Abdominal hysterectomy  1998    TAH, oophorectomy  . Appendectomy  1970  . Port-a-cath removal  08/30/2012    Procedure: REMOVAL PORT-A-CATH;  Surgeon: Imogene Burn. [name], MD;  Location: [facility];  Service: General;  Laterality: Right;  port removal   Family History  Problem Relation Age of Onset  . Hypertension Maternal Grandmother   . Diabetes Maternal Grandmother   . Cancer Father 53    lung cancer and Prostate Cancer  . Hypertension Mother   . Cancer Paternal Aunt     ovarian  . Cancer Cousin     breast, paternal cousin  . Cancer Paternal Uncle     stomach  . Cancer Paternal Grandfather     Esophagus  . Colon cancer Neg Hx    History   Social History  . Marital Status: Married    Spouse Name: N/A    Number of Children: 3  . Years of Education: N/A   Occupational History  . Works at [employer] Topics  . Smoking status: Never Smoker   . Smokeless tobacco: Never Used  . Alcohol Use: No  . Drug Use: No  . Sexual Activity: Yes    Birth Control/ Protection: Surgical     Comment: menarche 19, Parity age 57, G46, P3, 1 miscarriage,  HRT x 5-10 yrs, Mild Hot Flashes   Other Topics Concern  . Not on file   Social History Narrative   Lives at home.   Physical Exam: Blood pressure 188/74, pulse 90, temperature 98.2 F (36.8 C), temperature source Oral, resp. rate 16, height 5' 8"  (1.727 m), weight 80.287 kg (177 lb), SpO2 100  %. General appearance: alert, cooperative and no distress Head: Normocephalic, without obvious abnormality, atraumatic Eyes: conjunctivae/corneas clear. PERRL, EOM's intact. Fundi benign. Throat: lips, mucosa, and tongue normal; teeth and gums normal Lungs: clear to auscultation bilaterally Heart: regular rate  and rhythm, S1, S2 normal, no murmur, click, rub or gallop Abdomen: soft, non-tender; bowel sounds normal; no masses,  no organomegaly Extremities: trace pretibial edema bilaterally, no cyanosis Neurologic: Grossly normal  Lab results: Basic Metabolic Panel:  Recent Labs  10/23/14 1156  NA 139  K 3.3*  CL 106  CO2 25  GLUCOSE 119*  BUN 13  CREATININE 0.57  CALCIUM 9.5   CBC:  Recent Labs  10/23/14 1156  WBC 4.8  HGB 12.6  HCT 37.2  MCV 89.2  PLT 252   Cardiac Enzymes: Troponin (Point of Care Test)  Recent Labs  10/23/14 1225  TROPIPOC 0.00    Imaging results:  No results found.  Other results: EKG: Unchanged from previous tracings.    Assessment & Plan by Problem: Principal Problem:   Chest pain Active Problems:   Hypothyroidism   Essential hypertension   Coronary atherosclerosis  64 y/o woman with CAD and past history of MI as well as multiple other medical problems presenting for intermittent chest pain.   Chest Pain:  Intermittent chest pain in a woman with a history of CAD and MI is obviously concerning for ACS, or potentially unstable angina. The negative troponin as well as a normal EKG make either of these diagnosis less likely. The chest pain could also represent a transient arrhythmia. Given the recent death of her sister-in-law from MI, as well as her reported history of "anxiety attacks", it is also possible that these symptoms represent increased anxiety and/or panic attack disorder.  - We will plan to draw serial troponins every 3 hrs. to rule out ACS - Continuous telemetry to monitor for ST changes as well as any  arrhythmias  Hyperlipidemia: Patient has a history of CAD andhyperlipidemia but has stopped therapy on her own.  - will obtain fasting lipid panel in the morning to assess lipid status  Hyptertension: BP's slightly elevated, will monitor closely for need for additional agent.  - Continue home metoprolol.   Hypothyroidism: Patient with a history of hypothyroidism, although it is unclear the last time her thyroid status has been checked.  - Will check TSH to ensure this is not contributing to any possible arrhythmia.   Dispo: Disposition is deferred at this time, awaiting improvement of current medical problems. Anticipated discharge in approximately 1day(s).   The patient does have a current PCP [name], MD) and does not need an [facility] hospital follow-up appointment after discharge.  The patient does not have transportation limitations that hinder transportation to clinic appointments.  Signed: Crecencio Mc, Med Student 10/23/2014, 3:21 PM

## 2014-10-24 ENCOUNTER — Observation Stay (HOSPITAL_COMMUNITY): Payer: PRIVATE HEALTH INSURANCE

## 2014-10-24 DIAGNOSIS — I517 Cardiomegaly: Secondary | ICD-10-CM

## 2014-10-24 DIAGNOSIS — E875 Hyperkalemia: Secondary | ICD-10-CM

## 2014-10-24 DIAGNOSIS — I252 Old myocardial infarction: Secondary | ICD-10-CM

## 2014-10-24 DIAGNOSIS — C50911 Malignant neoplasm of unspecified site of right female breast: Secondary | ICD-10-CM

## 2014-10-24 DIAGNOSIS — R072 Precordial pain: Secondary | ICD-10-CM

## 2014-10-24 DIAGNOSIS — Z9013 Acquired absence of bilateral breasts and nipples: Secondary | ICD-10-CM

## 2014-10-24 DIAGNOSIS — R079 Chest pain, unspecified: Secondary | ICD-10-CM

## 2014-10-24 DIAGNOSIS — C50912 Malignant neoplasm of unspecified site of left female breast: Secondary | ICD-10-CM

## 2014-10-24 LAB — LIPID PANEL
Cholesterol: 240 mg/dL — ABNORMAL HIGH (ref 0–200)
HDL: 39 mg/dL — ABNORMAL LOW (ref >39)
LDL Cholesterol: 171 mg/dL — ABNORMAL HIGH (ref 0–99)
Total CHOL/HDL Ratio: 6.2 RATIO
Triglycerides: 149 mg/dL (ref <150)
VLDL: 30 mg/dL (ref 0–40)

## 2014-10-24 LAB — TSH: TSH: 5.147 u[IU]/mL — ABNORMAL HIGH (ref 0.350–4.500)

## 2014-10-24 LAB — TROPONIN I: Troponin I: <0.03 ng/mL (ref <0.031)

## 2014-10-24 LAB — HEMOGLOBIN A1C
Hgb A1c MFr Bld: 5.8 % — ABNORMAL HIGH (ref <5.7)
Mean Plasma Glucose: 120 mg/dL — ABNORMAL HIGH (ref <117)

## 2014-10-24 MED ORDER — ROSUVASTATIN CALCIUM 10 MG PO TABS: 10.0000 mg | ORAL_TABLET | Freq: Every day | ORAL | Status: DC

## 2014-10-24 MED ORDER — TECHNETIUM TC 99M SESTAMIBI GENERIC - CARDIOLITE
30.0000 | Freq: Once | INTRAVENOUS | Status: AC | PRN
  Administered 2014-10-24: 30 via INTRAVENOUS

## 2014-10-24 MED ORDER — TECHNETIUM TC 99M SESTAMIBI GENERIC - CARDIOLITE
10.0000 | Freq: Once | INTRAVENOUS | Status: AC | PRN
  Administered 2014-10-24: 10 via INTRAVENOUS

## 2014-10-24 NOTE — Discharge Instructions (Signed)
You were admitted because of chest pain and were evaluated for possible acute coronary syndrome (ACS), or a heart attack. However, all of the tests you had were normal, and none had any evidence of ACS or heart attack.   While you were admitted, we checked your TSH, which is a lab that lets [name] know how your thyroid is working. Your TSH suggested that your thyroid level was low, and that you might need to have your Synthroid dose adjusted by your primary care physician.   You also had your cholesterol levels checked while you were admitted. These showed that your LDL (bad cholesterol) was high, and you HDL (good cholesterol) was low, so we are starting you on the medication Crestor. Please let your primary care physician know that you are taking this medication.   Please make an appointment with your primary care physician in approximately one week to follow-up about the issues mentioned above.   Below you will find some information on non-specific chest pain that you might find helpful.     Chest Pain (Nonspecific) It is often hard to give a specific diagnosis for the cause of chest pain. There is always a chance that your pain could be related to something serious, such as a heart attack or a blood clot in the lungs. You need to follow up with your health care provider for further evaluation. CAUSES   Heartburn.  Pneumonia or bronchitis.  Anxiety or stress.  Inflammation around your heart (pericarditis) or lung (pleuritis or pleurisy).  A blood clot in the lung.  A collapsed lung (pneumothorax). It can develop suddenly on its own (spontaneous pneumothorax) or from trauma to the chest.  Shingles infection (herpes zoster virus). The chest wall is composed of bones, muscles, and cartilage. Any of these can be the source of the pain.  The bones can be bruised by injury.  The muscles or cartilage can be strained by coughing or overwork.  The cartilage can be affected by inflammation  and become sore (costochondritis). DIAGNOSIS  Lab tests or other studies may be needed to find the cause of your pain. Your health care provider may have you take a test called an ambulatory electrocardiogram (ECG). An ECG records your heartbeat patterns over a 24-hour period. You may also have other tests, such as:  Transthoracic echocardiogram (TTE). During echocardiography, sound waves are used to evaluate how blood flows through your heart.  Transesophageal echocardiogram (TEE).  Cardiac monitoring. This allows your health care provider to monitor your heart rate and rhythm in real time.  Holter monitor. This is a portable device that records your heartbeat and can help diagnose heart arrhythmias. It allows your health care provider to track your heart activity for several days, if needed.  Stress tests by exercise or by giving medicine that makes the heart beat faster. TREATMENT   Treatment depends on what may be causing your chest pain. Treatment may include:  Acid blockers for heartburn.  Anti-inflammatory medicine.  Pain medicine for inflammatory conditions.  Antibiotics if an infection is present.  You may be advised to change lifestyle habits. This includes stopping smoking and avoiding alcohol, caffeine, and chocolate.  You may be advised to keep your head raised (elevated) when sleeping. This reduces the chance of acid going backward from your stomach into your esophagus. Most of the time, nonspecific chest pain will improve within 2-3 days with rest and mild pain medicine.  HOME CARE INSTRUCTIONS   If antibiotics were prescribed, take them  as directed. Finish them even if you start to feel better.  For the next few days, avoid physical activities that bring on chest pain. Continue physical activities as directed.  Do not use any tobacco products, including cigarettes, chewing tobacco, or electronic cigarettes.  Avoid drinking alcohol.  Only take medicine as  directed by your health care provider.  Follow your health care provider's suggestions for further testing if your chest pain does not go away.  Keep any follow-up appointments you made. If you do not go to an appointment, you could develop lasting (chronic) problems with pain. If there is any problem keeping an appointment, call to reschedule. SEEK MEDICAL CARE IF:   Your chest pain does not go away, even after treatment.  You have a rash with blisters on your chest.  You have a fever. SEEK IMMEDIATE MEDICAL CARE IF:   You have increased chest pain or pain that spreads to your arm, neck, jaw, back, or abdomen.  You have shortness of breath.  You have an increasing cough, or you cough up blood.  You have severe back or abdominal pain.  You feel nauseous or vomit.  You have severe weakness.  You faint.  You have chills. This is an emergency. Do not wait to see if the pain will go away. Get medical help at once. Call your local emergency services (911 in [place] Do not drive yourself to the hospital. MAKE SURE YOU:   Understand these instructions.  Will watch your condition.  Will get help right away if you are not doing well or get worse. Document Released: 07/13/2005 Document Revised: 10/08/2013 Document Reviewed: 05/08/2008 [facility] Patient Information 2015 [place], [place]. This information is not intended to replace advice given to you by your health care provider. Make sure you discuss any questions you have with your health care provider.

## 2014-10-24 NOTE — Progress Notes (Signed)
*  PRELIMINARY RESULTS* Echocardiogram 2D Echocardiogram has been performed.  [name] Riley 10/24/2014, 3:11 PM

## 2014-10-24 NOTE — Progress Notes (Signed)
UR completed 

## 2014-10-24 NOTE — Discharge Summary (Signed)
Patient Name: [name] Riley  MRN:  [id number]   DOB: [birth year]   PCP: [name], MD         Date of Admission: 10/23/2014  Date of Discharge: 10/24/2014        Attending Physician: Sid Falcon, MD      DISCHARGE DIAGNOSES: Atypical chest pain without evidence of MI or ACS.  Other problems- Hypothyroidsm HTN CAD, prior MI HLD Hx of breast cancer  DISPOSITION AND FOLLOW-UP: [name] Riley is to follow-up with the listed providers as detailed below, at which time, the following should be addressed:   1. Hypothyroidism (PCP), Hypertension (PCP), Hyperlipidemia (PCP)  2. Labs / imaging needed at time of follow-up: TSH, fasting lipids, HgbA1c, BMET- K  3. Pending labs/ test needing follow-up: None   DISCHARGE INSTRUCTIONS:   You were admitted because of chest pain and were evaluated for possible acute coronary syndrome (ACS), or a heart attack. However, all of the tests you had were normal, and none had any evidence of ACS or heart attack.   While you were admitted, we checked your TSH, which is a lab that lets [name] know how your thyroid is working. Your TSH suggested that your thyroid level was low, and that you might need to have your Synthroid dose adjusted by your primary care physician.   You also had your cholesterol levels checked while you were admitted. These showed that your LDL (bad cholesterol) was high, and you HDL (good cholesterol) was low, so we are starting you on the medication Crestor.   Please let your primary care physician know that you are taking this medication.  Please make an appointment with your primary care physician in approximately one week to follow-up about the issues mentioned above.    DISCHARGE MEDICATIONS:   Medication List    ASK your doctor about these medications        anastrozole 1 MG tablet  Commonly known as:  ARIMIDEX  Take 1 tablet (1 mg total) by mouth daily.     aspirin 81 MG tablet  Take 81 mg by mouth  daily.     levothyroxine 75 MCG tablet  Commonly known as:  SYNTHROID, LEVOTHROID  Take 75 mcg by mouth daily.     metoprolol succinate 100 MG 24 hr tablet  Commonly known as:  TOPROL-XL  Take 100 mg by mouth daily.     MOVIPREP 100 G Solr  Generic drug:  peg 3350 powder  moviprep as directed. No substitutions     multivitamin with minerals tablet  Take 1 tablet by mouth daily.     URIBEL 118 MG Caps  Take 1 capsule by mouth 4 (four) times daily.     VITAMIN D-3 PO  Take 2,000 Int'l Units by mouth daily.       CONSULTS:  Treatment Team:  Rounding Lbcardiology, MD   PROCEDURES PERFORMED:  Dg Chest 2 View  10/23/2014   CLINICAL DATA:  Chest pain and heart fluttering this morning with weakness this evening.  EXAM: CHEST  2 VIEW  COMPARISON:  11/14/2011 and 11/07/2011  FINDINGS: Lungs are adequately inflated without focal consolidation or effusion. Cardiomediastinal silhouette and remainder of the exam is unchanged.  IMPRESSION: No active cardiopulmonary disease.   Electronically Signed   By: Marin Olp M.D.   On: 10/23/2014 21:10   Nm Myocar Multi W/spect W/wall Motion / Ef  10/24/2014   CLINICAL DATA:  Chest pain. History of hypertension, hyperlipidemia  and CAD (previous MI in 2001). History of left-sided breast cancer.  EXAM: MYOCARDIAL IMAGING WITH SPECT (REST AND EXERCISE)  GATED LEFT VENTRICULAR WALL MOTION STUDY  LEFT VENTRICULAR EJECTION FRACTION  TECHNIQUE: Standard myocardial SPECT imaging was performed after resting intravenous injection of 10 mCi Tc-81m sestamibi. Subsequently, exercise tolerance test was performed by the patient under the supervision of the Cardiology staff. At peak-stress, 30 mCi Tc-15m sestamibi was injected intravenously and standard myocardial SPECT imaging was performed. Note, patient was able to achieve 94% of maximal, age predicted heart rate. Quantitative gated imaging was also performed to evaluate left ventricular wall motion, and estimate left  ventricular ejection fraction.  COMPARISON:  Chest radiograph - 10/23/2014  FINDINGS: Raw images: There is mild patient motion artifact the provided rest images. There is mild breast and chest wall attenuation on both the provided rest and stress images.  Perfusion: There are grossly matched areas of attenuation involving the inferior wall of the left ventricle and left ventricular apex without associated regional wall motion abnormalities. There is no definitive scintigraphic evidence of prior infarction pharmacologically induced Ing.  Wall Motion: Normal left ventricular wall motion. No left ventricular dilation.  Left Ventricular Ejection Fraction: 61 %  End diastolic volume 70 ml  End systolic volume 27 ml  IMPRESSION: 1. No definitive scintigraphic evidence of prior infarction or pharmacologically induced ischemia.  2. Normal left ventricular wall motion.  3. Left ventricular ejection fraction 61%  4. Low-risk stress test findings*.  *2012 Appropriate Use Criteria for Coronary Revascularization Focused Update: J Am Coll Cardiol. 4580;99(8):338-250. http://content.airportbarriers.com.aspx?articleid=1201161   Electronically Signed   By: Sandi Mariscal M.D.   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 The pain is described as dull to sharp, lasting for a few minutes, radiating to her right arm. She had no diaphoresis, N/V, tingling sensation associated with the pain but did feel "flushed". She describes the pain as different from what she experience when she had her MI years ago. She reports having a normal cath years ago surrounding the time she had her MI and had been "dismissed" by her Cardiologist with no recent stress  tests and no recurrent chest pain since then until today.  Her pain resolved with 4 ASA 81mg  and one SL nitroglycerin (given her EMS en route to the ED) with no chest pain on arrival to the ED.  She notes that she has been under great stress recently, especially with the death of her sister-in-law from a massive MI.   Physical Exam: Physical Exam  Nursing note and vitals reviewed. Constitutional: She appears well-developed and well-nourished. No distress.  HENT:  Head: Normocephalic.  Mouth/Throat: Oropharynx is clear and moist. No oropharyngeal exudate.  Eyes: Conjunctivae and EOM are normal. Pupils are equal, round, and reactive to light. Right eye exhibits no discharge. Left eye exhibits no discharge. No scleral icterus.  Neck: Neck supple. No JVD present.  Cardiovascular: Normal rate and regular rhythm.  Respiratory: Effort normal and breath sounds normal. No respiratory distress. She has no wheezes. She has no rales.  GI: Soft. Bowel sounds are normal. She exhibits no distension. There is no tenderness. There is no rebound and no guarding.  Musculoskeletal: She exhibits  edema. She exhibits no tenderness.  Trace pitting edema bilaterally up to her knees  Neurological: She is alert.  Skin: Skin is warm and dry. No rash noted. She is not diaphoretic. No erythema.  Psychiatric: She has a normal mood and affect. Her behavior is normal.    Lab results: Basic Metabolic Panel:       43/32/95 1156  NA 139  K 3.3*  CL 106  CO2 25  GLUCOSE 119*  BUN 13  CREATININE 0.57  CALCIUM 9.5     CBC:       10/23/14 1156  WBC 4.8  HGB 12.6  HCT 37.2  MCV 89.2  PLT 252     Cardiac Enzymes:       10/23/14 1842  TROPONINI <0.03      Results of stress test- MYOCARDIAL IMAGING WITH SPECT (REST AND EXERCISE)- 11/03/2016.  IMPRESSION: 1. No definitive scintigraphic evidence of prior infarction or pharmacologically induced ischemia.  2.  Normal left ventricular wall motion.  3. Left ventricular ejection fraction 61%  4. Low-risk stress test findings*.    HOSPITAL COURSE:  Chest pain: Initial EKG in the ED was unchanged from prior EKG in 2013.  4 Serial troponins drawn in 3 hr intervals were negative. Patient was placed on telemetry monitoring, which revealed no abnormalities or arrhythmias. Cardiology was consulted, a rest and exercise stress myoview test was performed on 10/24/2014, impression- No definitive scintigraphic evidence of prior infarction or pharmacologically induced ischemia, Normal left ventricular wall motion, Left ventricular ejection fraction 61%, Low-risk stress test findings. Pt was discharged to follow up with cardiology- 11/12/2013.   HTN: The patient was maintained on her home metoprolol 100mg  daily to manage her hypertension.  To continue on discharge.   Hyperlipidemia: The patient has a history of hyperlipidemia but was not receiving therapy at the time of presentation. A lipid panel drawn on 10/24/2014 revealed an elevated LDL- 171 and total cholesterol- 240 and a low HDL- 39, so the patient was started on Crestor 10 mg on discharge. HgBA1c drawn was also pending, to be followed up on discharge if abnormal.   Hypothyroidism: The patient has a history of hypothyroidism.  A TSH drawn during her admission was high (5.1). She has been instructed to have her PCP recheck this value after discharge and adjust her medications as appropriate. She was discharged on her home dose of 22mcg daily.  Breast Cancer: Patient with a history of stage IIB breast cancer s/p bilateral mastectomy and radiation. She was continued on her maintenance anastrozole while an inpatient.   Health Maintenance: The patient received a flu vaccine while admitted.   DISCHARGE DATA: Vital Signs: BP 115/65 mmHg  Pulse 72  Temp(Src) 98.2 F (36.8 C) (Oral)  Resp 18  Ht 5\' 8"  (1.727 m)  Wt 80.922 kg (178 lb 6.4 oz)  BMI 27.13 kg/m2   SpO2 95%  Labs: Results for orders placed or performed during the hospital encounter of 10/23/14 (from the past 24 hour(s))  Troponin I-serum (0, 3, 6 hours)     Status: None   Collection Time: 10/23/14  6:42 PM  Result Value Ref Range   Troponin I <0.03 <0.031 ng/mL  Troponin I-serum (0, 3, 6 hours)     Status: None   Collection Time: 10/23/14  9:40 PM  Result Value Ref Range   Troponin I <0.03 <0.031 ng/mL  Lipid panel     Status: Abnormal   Collection Time: 10/24/14  3:03 AM  Result Value  Ref Range   Cholesterol 240 (H) 0 - 200 mg/dL   Triglycerides 149 <150 mg/dL   HDL 39 (L) >39 mg/dL   Total CHOL/HDL Ratio 6.2 RATIO   VLDL 30 0 - 40 mg/dL   LDL Cholesterol 171 (H) 0 - 99 mg/dL  TSH     Status: Abnormal   Collection Time: 10/24/14  3:03 AM  Result Value Ref Range   TSH 5.147 (H) 0.350 - 4.500 uIU/mL  Troponin I     Status: None   Collection Time: 10/24/14 12:08 PM  Result Value Ref Range   Troponin I <0.03 <0.031 ng/mL     Time spent on discharge: 35 minutes  Services Ordered on Discharge: Y = Yes; Blank = No PT:   OT:   RN:   Equipment:   Other:

## 2014-10-24 NOTE — Progress Notes (Signed)
    Subjective:  Denies CP or dyspnea   Objective:  Filed Vitals:   10/23/14 1451 10/23/14 2008 10/23/14 2346 10/24/14 0400  BP: 188/74 138/58 115/64 124/66  Pulse: 90 72 69 72  Temp: 98.2 F (36.8 C) 97.8 F (36.6 C) 98.3 F (36.8 C) 98.2 F (36.8 C)  TempSrc: Oral Oral Oral Oral  Resp: 16  18 18   Height: 5\' 8"  (1.727 m)     Weight: 177 lb (80.287 kg)   178 lb 6.4 oz (80.922 kg)  SpO2: 100% 99% 96% 95%    Intake/Output from previous day:  Intake/Output Summary (Last 24 hours) at 10/24/14 0816 Last data filed at 10/23/14 2017  Gross per 24 hour  Intake      0 ml  Output   1200 ml  Net  -1200 ml    Physical Exam: Physical exam: Well-developed well-nourished in no acute distress.  Skin is warm and dry.  HEENT is normal.  Neck is supple.  Chest is clear to auscultation with normal expansion.  Cardiovascular exam is regular rate and rhythm.  Abdominal exam nontender or distended. No masses palpated. Extremities show no edema. neuro grossly intact    Lab Results: Basic Metabolic Panel:  Recent Labs  10/23/14 1156  NA 139  K 3.3*  CL 106  CO2 25  GLUCOSE 119*  BUN 13  CREATININE 0.57  CALCIUM 9.5   CBC:  Recent Labs  10/23/14 1156  WBC 4.8  HGB 12.6  HCT 37.2  MCV 89.2  PLT 252   Cardiac Enzymes:  Recent Labs  10/23/14 1842 10/23/14 2140  TROPONINI <0.03 <0.03     Assessment/Plan:  1 chest pain-enzymes are negative. Plan to await results of nuclear study. She is also scheduled for an echocardiogram. If nuclear study normal patient can be discharged and follow-up in with Dr Percival Spanish in [place].  2 hypertension-Resume toprol at DC. She will need follow-up of her blood pressure as an outpatient. 3 hypothyroidism-management per primary care.  Kirk Ruths 10/24/2014, 8:16 AM

## 2014-10-24 NOTE — Progress Notes (Signed)
Subjective: No complaints today. No chest pains since she came in. Tolerated stress test well without any problems. Will like to go home today. Husband at bedside.   Objective: Vital signs in last 24 hours: Filed Vitals:   10/24/14 1009 10/24/14 1011 10/24/14 1016 10/24/14 1223  BP: 143/87 179/89 190/75 115/65  Pulse:      Temp:      TempSrc:      Resp:      Height:      Weight:      SpO2:       Weight change:   Intake/Output Summary (Last 24 hours) at 10/24/14 1359 Last data filed at 10/24/14 1200  Gross per 24 hour  Intake      0 ml  Output   1500 ml  Net  -1500 ml   General appearance: alert, cooperative, appears stated age and no distress Head: Normocephalic, without obvious abnormality, atraumatic Lungs: clear to auscultation bilaterally Heart: regular rate and rhythm, S1, S2 normal, no murmur, click, rub or gallop Abdomen: soft, non-tender; bowel sounds normal; no masses,  no organomegaly Extremities: extremities normal, atraumatic, no cyanosis or edema Lab Results: Basic Metabolic Panel:  Recent Labs Lab 10/23/14 1156  NA 139  K 3.3*  CL 106  CO2 25  GLUCOSE 119*  BUN 13  CREATININE 0.57  CALCIUM 9.5   CBC:  Recent Labs Lab 10/23/14 1156  WBC 4.8  HGB 12.6  HCT 37.2  MCV 89.2  PLT 252   Cardiac Enzymes:  Recent Labs Lab 10/23/14 1842 10/23/14 2140 10/24/14 1208  TROPONINI <0.03 <0.03 <0.03   Fasting Lipid Panel:  Recent Labs Lab 10/24/14 0303  CHOL 240*  HDL 39*  LDLCALC 171*  TRIG 149  CHOLHDL 6.2   Thyroid Function Tests:  Recent Labs Lab 10/24/14 0303  TSH 5.147*   Micro Results: No results found for this or any previous visit (from the past 240 hour(s)). Studies/Results: Dg Chest 2 View  10/23/2014   CLINICAL DATA:  Chest pain and heart fluttering this morning with weakness this evening.  EXAM: CHEST  2 VIEW  COMPARISON:  11/14/2011 and 11/07/2011  FINDINGS: Lungs are adequately inflated without focal consolidation  or effusion. Cardiomediastinal silhouette and remainder of the exam is unchanged.  IMPRESSION: No active cardiopulmonary disease.   Electronically Signed   By: Marin Olp M.D.   On: 10/23/2014 21:10   Scheduled Meds: . anastrozole  1 mg Oral Daily  . aspirin  81 mg Oral Daily  . heparin  5,000 Units Subcutaneous 3 times per day  . Influenza vac split quadrivalent PF  0.5 mL Intramuscular Tomorrow-1000  . levothyroxine  75 mcg Oral QAC breakfast   Continuous Infusions:  PRN Meds:.acetaminophen, morphine injection, ondansetron (ZOFRAN) IV Assessment/Plan:  Chest pain: Heart score of 3. Concern for CAD, considering possible given her history of MI with cardiac arrest and risk factors: HTN, hyperlipidemia.  Anxiety possible given recent loss in her family but a diagnoses of exclusion.  - Cards consulted, recs appreciated.  -  test results. -Risk stratification: lipid panel- LDL- 171, HgA1c- pending. - Discharge home to start Crestor- 10mg  and uptitrate on follow up, low dose considering previous intolerance to statin.  HTN: Controlled at home on metoprolol 100mg  XL.  -Hold BB for nuclear stress test -Monitor BP, may need give Norvasc tomorrow if BP >180/100  Hypokalemia: K of 3.3 on presentation, unclear etiology but perhaps a low potassium diet at home.  -Follow up with  PCP for Bmet check.   Hyperlipidemia: Pt stopped statin months ago on her own because her "cholesterol was good" while on statin.  -Restart statin   Hypothyroidism: Continue home synthroid 41mcg -follow up TSH with PCP  DVT prophylaxis: Heparin [place]  FEN: NSL Repeat BMET in am Heart healthy, NPO after midnight  Dispo: Disposition is deferred at this time, awaiting improvement of current medical problems.  Anticipated discharge in approximately today pending results of stress tests.   The patient does have a current PCP [name], MD) and does need an [facility] hospital follow-up appointment after  discharge.  The patient does have transportation limitations that hinder transportation to clinic appointments.  .Services Needed at time of discharge: Y = Yes, Blank = No PT:   OT:   RN:   Equipment:   Other:     LOS: 1 day   [name] Roys, MD 10/24/2014, 1:59 PM

## 2014-10-24 NOTE — Progress Notes (Signed)
Patient Name: [name] Riley Date of Encounter: 10/24/2014     Principal Problem:   Chest pain Active Problems:   Hypothyroidism   Essential hypertension   Coronary atherosclerosis    SUBJECTIVE Seen in nuclear med. No further chest pain. She tolerated ETT myoview well. She exceeded her target HR of 135 and walked 6 min bruce protocol. ECG with some mild upsloping ST depression in V4-V6. No CP but mild SOB.   CURRENT MEDS . anastrozole  1 mg Oral Daily  . aspirin  81 mg Oral Daily  . heparin  5,000 Units Subcutaneous 3 times per day  . Influenza vac split quadrivalent PF  0.5 mL Intramuscular Tomorrow-1000  . levothyroxine  75 mcg Oral QAC breakfast  . lisinopril  5 mg Oral Daily    OBJECTIVE  Filed Vitals:   10/23/14 2008 10/23/14 2346 10/24/14 0400 10/24/14 0953  BP: 138/58 115/64 124/66 143/85  Pulse: 72 69 72   Temp: 97.8 F (36.6 C) 98.3 F (36.8 C) 98.2 F (36.8 C)   TempSrc: Oral Oral Oral   Resp:  18 18   Height:      Weight:   178 lb 6.4 oz (80.922 kg)   SpO2: 99% 96% 95%     Intake/Output Summary (Last 24 hours) at 10/24/14 1000 Last data filed at 10/23/14 2017  Gross per 24 hour  Intake      0 ml  Output   1200 ml  Net  -1200 ml   Filed Weights   10/23/14 1451 10/24/14 0400  Weight: 177 lb (80.287 kg) 178 lb 6.4 oz (80.922 kg)    PHYSICAL EXAM  General: Pleasant, NAD. Neuro: Alert and oriented X 3. Moves all extremities spontaneously. Psych: Normal affect. HEENT:  Normal  Neck: Supple without bruits or JVD. Lungs:  Resp regular and unlabored, CTA. Heart: RRR no s3, s4, or murmurs. Abdomen: Soft, non-tender, non-distended, BS + x 4.  Extremities: No clubbing, cyanosis or edema. DP/PT/Radials 2+ and equal bilaterally.  Accessory Clinical Findings  CBC  Recent Labs  10/23/14 1156  WBC 4.8  HGB 12.6  HCT 37.2  MCV 89.2  PLT 947   Basic Metabolic Panel  Recent Labs  10/23/14 1156  NA 139  K 3.3*  CL 106  CO2 25  GLUCOSE  119*  BUN 13  CREATININE 0.57  CALCIUM 9.5   Cardiac Enzymes  Recent Labs  10/23/14 1842 10/23/14 2140  TROPONINI <0.03 <0.03   Fasting Lipid Panel  Recent Labs  10/24/14 0303  CHOL 240*  HDL 39*  LDLCALC 171*  TRIG 149  CHOLHDL 6.2   Thyroid Function Tests  Recent Labs  10/24/14 0303  TSH 5.147*    TELE NSR  Radiology/Studies  Dg Chest 2 View  10/23/2014   CLINICAL DATA:  Chest pain and heart fluttering this morning with weakness this evening.  EXAM: CHEST  2 VIEW  COMPARISON:  11/14/2011 and 11/07/2011  FINDINGS: Lungs are adequately inflated without focal consolidation or effusion. Cardiomediastinal silhouette and remainder of the exam is unchanged.  IMPRESSION: No active cardiopulmonary disease.   Electronically Signed   By: Marin Olp M.D.   On: 10/23/2014 21:10    ASSESSMENT AND PLAN  Ms. Gosselin is a 64 yr old woman with PMH of CAD, MI in 2001 with cardiac arrest, HTN, hypothyroidism, hyperlipidemia, and stage IIB breast cancer (s/p radiation, chemo, on maintenance tx), who was admitted to [facility] yesterday with acute substernal chest pain.  Chest  pain - atypical, but has a history of CAD, pre-MI symptoms were also atypical. Distal LAD known occluded, but EF nl by echo 2013.  -- Troponin neg x3 -- Lexiscan myoview today. Awaiting nuclear images  Hypothyroidism - TSH mildly elevated at 5.15. Per IM.   Essential hypertension -Controlled at home on metoprolol 100mg  XL.   Hypokalemia - supplemented by IM  Hyperlipidemia: Pt stopped statin months ago on her own because her "cholesterol was good" while on statin.  -- LDL 171. Agree with resuming statin.  She had leg pain on pravastatin previously.   Judy Pimple PA-C  Pager [phone]  See previous progress note Kirk Ruths

## 2014-10-24 NOTE — Progress Notes (Signed)
Subjective:    Currently, the patient is experiencing no further chest pain.   Interval Events: Underwent exercise stress test with cardiology this morning. Says she tolerated the test fine, currently awaiting final results.    Objective:    Vital Signs:   Temp:  [97.8 F (36.6 C)-98.3 F (36.8 C)] 98.2 F (36.8 C) (01/08 0400) Pulse Rate:  [66-90] 72 (01/08 0400) Resp:  [13-18] 18 (01/08 0400) BP: (115-190)/(58-89) 115/65 mmHg (01/08 1223) SpO2:  [94 %-100 %] 95 % (01/08 0400) Weight:  [80.287 kg (177 lb)-80.922 kg (178 lb 6.4 oz)] 80.922 kg (178 lb 6.4 oz) (01/08 0400) Last BM Date: 10/23/14   Intake/Output:   Intake/Output Summary (Last 24 hours) at 10/24/14 1302 Last data filed at 10/24/14 1200  Gross per 24 hour  Intake      0 ml  Output   1500 ml  Net  -1500 ml      Physical Exam: General: Vital signs reviewed and noted. Well-developed, well-nourished, in no acute distress; alert, appropriate and cooperative throughout examination.  Lungs:  Normal respiratory effort. Clear to auscultation BL without crackles or wheezes.  Heart: RRR. S1 and S2 normal without gallop, murmur, or rubs.  Abdomen:  BS normoactive. Soft, Nondistended, non-tender.  No masses or organomegaly.  Extremities: No pretibial edema.     Labs:  Basic Metabolic Panel:  Recent Labs Lab 10/23/14 1156  NA 139  K 3.3*  CL 106  CO2 25  GLUCOSE 119*  BUN 13  CREATININE 0.57  CALCIUM 9.5   Lab Results  Component Value Date   TSH 5.147* 10/24/2014    CBC:  Recent Labs Lab 10/23/14 1156  WBC 4.8  HGB 12.6  HCT 37.2  MCV 89.2  PLT 252    Cardiac Enzymes:  Recent Labs Lab 10/23/14 1842 10/23/14 2140  TROPONINI <0.03 <0.03   Other results: EKG: Unchanged from previous tracings.     Medications:    Infusions:    Scheduled Medications: . anastrozole  1 mg Oral Daily  . aspirin  81 mg Oral Daily  . heparin  5,000 Units Subcutaneous 3 times per day  . Influenza  vac split quadrivalent PF  0.5 mL Intramuscular Tomorrow-1000  . levothyroxine  75 mcg Oral QAC breakfast  . lisinopril  5 mg Oral Daily    PRN Medications: acetaminophen, morphine injection, ondansetron (ZOFRAN) IV   Assessment/ Plan:    Pt is a 64 y.o. yo female with a PMHx of CAD, MI, HTN, hyperlipidemia, hypothyroidism, and breast cancer  who was admitted on 10/23/2014 with symptoms of chest pain.  Chest pain: EKGs normal, troponins negative x 3, pain has resolved. Very atypical presentation for ACS but given past history of MI caution is warranted. Patient also has a reported history of anxiety attacks, and this could possibly be causing/contributing to her pain.  - exercise stress test with cardiology today, results pending - lipid panel with elevated LDL, low HDL, and elevated total cholesterol this morning, will start Crestor  - f/u HgbA1c - continue ASA 81mg  per day  Hyperstension: Continue home metoprolol.   Hypothyroidism: TSH elevated at 5.147.  - will increase synthroid dosing to 100, to follow up with PCP  Breast Cancer: S/p bilateral mastectomy and radiation, now on maintenance therapy.  - continue home anastrozole    DVT PPX - heparin  CODE STATUS - Full   CONSULTS PLACED - Cardiology  DISPO - Disposition is deferred at this time, awaiting results of  nuclear stress test.   Anticipated discharge in approximately 1day(s).   The patient does have a current PCP (Sherrie Mustache, MD) and does need an [facility] hospital follow-up appointment after discharge.    Is the [facility] hospital follow-up appointment a one-time only appointment? no.  Does the patient have transportation limitations that hinder transportation to clinic appointments? no   SERVICE NEEDED AT [place]         Y = Yes, Blank = No PT:   OT:   RN:   Equipment:   Other:      Length of Stay: 1 day(s)   Signed: Crecencio Mc, Med Student  Pager:  [phone] (7AM-5PM) 10/24/2014, 1:02 PM

## 2014-10-29 DIAGNOSIS — N390 Urinary tract infection, site not specified: Secondary | ICD-10-CM | POA: Diagnosis present

## 2014-11-06 ENCOUNTER — Encounter: Payer: PRIVATE HEALTH INSURANCE | Admitting: Cardiovascular Disease

## 2014-11-12 ENCOUNTER — Encounter: Payer: PRIVATE HEALTH INSURANCE | Admitting: Cardiology

## 2014-11-20 ENCOUNTER — Telehealth: Payer: Self-pay | Admitting: *Deleted

## 2014-11-20 NOTE — Telephone Encounter (Signed)
INFORMED PT. TO CALL HER SURGEON. SHE VOICES UNDERSTANDING.

## 2014-12-29 ENCOUNTER — Other Ambulatory Visit: Payer: Self-pay | Admitting: *Deleted

## 2015-01-02 ENCOUNTER — Telehealth: Payer: Self-pay | Admitting: Hematology and Oncology

## 2015-01-02 ENCOUNTER — Telehealth: Payer: Self-pay

## 2015-01-02 NOTE — Telephone Encounter (Signed)
Per pof dr Lindi Adie appointment has been rescheduled anad a calendar has been mailed.  Number given also for womens to call for dexa reschedule  anne

## 2015-01-02 NOTE — Telephone Encounter (Signed)
Called pt to provide d/t for bone density.  Pt unable to schedule anything "semi-retired, leaving for trip to [place], don't know when she will be back".  Advised pt she has appt with Dr. Lindi Adie 4/12.  Pt stated she will not be able to make that appt.  Pt asked reason for bone density - advised the arimidex can weaken her bones.  Let pt know I would move all appts out - asked if 1st week of May would be ok.  Pt stated it would.  Let pt know we would mail her a schedule.  Pt voiced understanding.

## 2015-01-07 ENCOUNTER — Telehealth: Payer: Self-pay | Admitting: Hematology and Oncology

## 2015-01-07 NOTE — Telephone Encounter (Signed)
Patient called in to r/s her may appointment,done     anne

## 2015-01-08 ENCOUNTER — Ambulatory Visit (HOSPITAL_COMMUNITY): Payer: PRIVATE HEALTH INSURANCE

## 2015-01-16 ENCOUNTER — Other Ambulatory Visit: Payer: Self-pay | Admitting: Hematology and Oncology

## 2015-01-16 DIAGNOSIS — C50912 Malignant neoplasm of unspecified site of left female breast: Secondary | ICD-10-CM

## 2015-01-27 ENCOUNTER — Other Ambulatory Visit: Payer: PRIVATE HEALTH INSURANCE

## 2015-01-27 ENCOUNTER — Ambulatory Visit: Payer: PRIVATE HEALTH INSURANCE | Admitting: Hematology and Oncology

## 2015-02-12 ENCOUNTER — Ambulatory Visit (HOSPITAL_COMMUNITY)
Admission: RE | Admit: 2015-02-12 | Discharge: 2015-02-12 | Disposition: A | Discharge status: Home / Self Care | Payer: PRIVATE HEALTH INSURANCE | Source: Ambulatory Visit | Attending: Hematology and Oncology | Admitting: Hematology and Oncology

## 2015-02-12 ENCOUNTER — Ambulatory Visit (HOSPITAL_COMMUNITY): Payer: PRIVATE HEALTH INSURANCE

## 2015-02-12 ENCOUNTER — Other Ambulatory Visit (HOSPITAL_COMMUNITY): Payer: PRIVATE HEALTH INSURANCE

## 2015-02-12 DIAGNOSIS — C50912 Malignant neoplasm of unspecified site of left female breast: Secondary | ICD-10-CM | POA: Diagnosis present

## 2015-02-12 DIAGNOSIS — M8580 Other specified disorders of bone density and structure, unspecified site: Secondary | ICD-10-CM | POA: Insufficient documentation

## 2015-02-20 ENCOUNTER — Ambulatory Visit: Payer: PRIVATE HEALTH INSURANCE | Admitting: Hematology and Oncology

## 2015-02-20 ENCOUNTER — Other Ambulatory Visit: Payer: PRIVATE HEALTH INSURANCE

## 2015-02-25 ENCOUNTER — Other Ambulatory Visit: Payer: Self-pay | Admitting: *Deleted

## 2015-02-25 DIAGNOSIS — E559 Vitamin D deficiency, unspecified: Secondary | ICD-10-CM

## 2015-02-25 DIAGNOSIS — C50912 Malignant neoplasm of unspecified site of left female breast: Secondary | ICD-10-CM

## 2015-02-25 MED ORDER — ANASTROZOLE 1 MG PO TABS: 1.0000 mg | ORAL_TABLET | Freq: Every day | ORAL | Status: DC

## 2015-02-25 NOTE — Telephone Encounter (Signed)
Refill: Arimidex. Patient notified and verbalized understanding.

## 2015-03-09 NOTE — Assessment & Plan Note (Signed)
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 Currently on Arimidex since 08/22/2012  Breast Cancer Surveillance: 1. Breast exam 03/10/15: Normal 2. Mammogram No role of mammograms since she had bilateral mastectomies   Aromatase inhibitor surveillance:  1. Bone density: T score -1.1 osteopenia Return to clinic in 6 months for a blood work and followup.

## 2015-03-10 ENCOUNTER — Ambulatory Visit (HOSPITAL_BASED_OUTPATIENT_CLINIC_OR_DEPARTMENT_OTHER): Payer: PRIVATE HEALTH INSURANCE | Admitting: Hematology and Oncology

## 2015-03-10 ENCOUNTER — Other Ambulatory Visit (HOSPITAL_BASED_OUTPATIENT_CLINIC_OR_DEPARTMENT_OTHER): Payer: PRIVATE HEALTH INSURANCE

## 2015-03-10 VITALS — BP 181/93 | HR 84 | Temp 98.2°F | Resp 18 | Ht 68.0 in | Wt 176.5 lb

## 2015-03-10 DIAGNOSIS — C50812 Malignant neoplasm of overlapping sites of left female breast: Secondary | ICD-10-CM

## 2015-03-10 DIAGNOSIS — C773 Secondary and unspecified malignant neoplasm of axilla and upper limb lymph nodes: Secondary | ICD-10-CM

## 2015-03-10 DIAGNOSIS — C50912 Malignant neoplasm of unspecified site of left female breast: Secondary | ICD-10-CM

## 2015-03-10 DIAGNOSIS — M8580 Other specified disorders of bone density and structure, unspecified site: Secondary | ICD-10-CM

## 2015-03-10 LAB — COMPREHENSIVE METABOLIC PANEL (CC13)
ALT: 26 U/L (ref 0–55)
AST: 21 U/L (ref 5–34)
Albumin: 4.1 g/dL (ref 3.5–5.0)
Alkaline Phosphatase: 138 U/L (ref 40–150)
Anion Gap: 10 mEq/L (ref 3–11)
BUN: 11.2 mg/dL (ref 7.0–26.0)
CO2: 27 mEq/L (ref 22–29)
Calcium: 10 mg/dL (ref 8.4–10.4)
Chloride: 104 mEq/L (ref 98–109)
Creatinine: 0.8 mg/dL (ref 0.6–1.1)
EGFR: 85 mL/min/{1.73_m2} — ABNORMAL LOW (ref >90)
Glucose: 92 mg/dl (ref 70–140)
Potassium: 3.2 mEq/L — ABNORMAL LOW (ref 3.5–5.1)
Sodium: 141 mEq/L (ref 136–145)
Total Bilirubin: 0.49 mg/dL (ref 0.20–1.20)
Total Protein: 7.5 g/dL (ref 6.4–8.3)

## 2015-03-10 LAB — CBC WITH DIFFERENTIAL/PLATELET
BASO%: 0.6 % (ref 0.0–2.0)
Basophils Absolute: 0 10*3/uL (ref 0.0–0.1)
EOS%: 4 % (ref 0.0–7.0)
Eosinophils Absolute: 0.3 10*3/uL (ref 0.0–0.5)
HCT: 38.1 % (ref 34.8–46.6)
HGB: 13.1 g/dL (ref 11.6–15.9)
LYMPH%: 41 % (ref 14.0–49.7)
MCH: 31 pg (ref 25.1–34.0)
MCHC: 34.4 g/dL (ref 31.5–36.0)
MCV: 90.1 fL (ref 79.5–101.0)
MONO#: 0.6 10*3/uL (ref 0.1–0.9)
MONO%: 9.7 % (ref 0.0–14.0)
NEUT#: 2.9 10*3/uL (ref 1.5–6.5)
NEUT%: 44.7 % (ref 38.4–76.8)
Platelets: 302 10*3/uL (ref 145–400)
RBC: 4.23 10*6/uL (ref 3.70–5.45)
RDW: 12.6 % (ref 11.2–14.5)
WBC: 6.5 10*3/uL (ref 3.9–10.3)
lymph#: 2.7 10*3/uL (ref 0.9–3.3)

## 2015-03-10 NOTE — Progress Notes (Signed)
Patient Care Team: [name], MD as PCP - General (Family Medicine)  DIAGNOSIS: Breast cancer, left breast   Staging form: Breast, AJCC 7th Edition     Clinical: Stage IIB (T2, N1, cM0) - Unsigned     Pathologic: No stage assigned - Unsigned   SUMMARY OF ONCOLOGIC HISTORY:   Breast cancer, left breast   11/14/2011 Surgery Bilateral mastectomy, prophylactic on the right, left breast IDC 3/18 lymph nodes positive with extracapsular extension ER 89%, PR 81%, HER-2 negative, Ki-67 79% T2 N1 A. stage IIB   12/13/2011 - 06/28/2012 Chemotherapy 4 cycles of FEC followed by 4 cycles of Taxotere   07/17/2012 - 08/22/2012 Radiation Therapy Adjuvant radiation therapy   08/22/2012 -  Anti-estrogen oral therapy Arimidex 1 mg daily    CHIEF COMPLIANT: Follow-up of left breast cancer currently on Arimidex.  INTERVAL HISTORY: [name] Riley is a 64 year old with above-mentioned history of left breast cancer currently on Arimidex and tolerating it extremely well occasional muscle aches and pains but otherwise no symptoms or concerns. Denies any lumps or nodules in the breast. She felt a pocket in the left axillary area which is a normal scar tissue. She had seen Dr. Molli Posey who reassured her as well. She was recently bitten by a spider on the leg which led to a big infected ulcer that was treated with incision and drainage at an urgent care.  REVIEW OF SYSTEMS:   Constitutional: Denies fevers, chills or abnormal weight loss Eyes: Denies blurriness of vision Ears, nose, mouth, throat, and face: Denies mucositis or sore throat Respiratory: Denies cough, dyspnea or wheezes Cardiovascular: Denies palpitation, chest discomfort or lower extremity swelling Gastrointestinal:  Denies nausea, heartburn or change in bowel habits Skin: Denies abnormal skin rashes Lymphatics: Denies new lymphadenopathy or easy bruising Neurological:Denies numbness, tingling or new weaknesses Behavioral/Psych: Mood is stable, no new  changes  Breast:  denies any pain or lumps or nodules in chest or axilla All other systems were reviewed with the patient and are negative.  I have reviewed the past medical history, past surgical history, social history and family history with the patient and they are unchanged from previous note.  ALLERGIES:  is allergic to contrast media and food.  MEDICATIONS:  Current Outpatient Prescriptions  Medication Sig Dispense Refill  . anastrozole (ARIMIDEX) 1 MG tablet Take 1 tablet (1 mg total) by mouth daily. 90 tablet 3  . aspirin 81 MG tablet Take 81 mg by mouth daily.      . cephALEXin (KEFLEX) 500 MG capsule Take 500 mg by mouth.    . Cholecalciferol (VITAMIN D-3 PO) Take 2,000 Int'l Units by mouth daily.    Marland Kitchen doxycycline (VIBRA-TABS) 100 MG tablet Take 100 mg by mouth.    . levothyroxine (SYNTHROID, LEVOTHROID) 75 MCG tablet Take 75 mcg by mouth daily.     . metoprolol (TOPROL-XL) 100 MG 24 hr tablet Take 100 mg by mouth daily.     . Multiple Vitamins-Minerals (MULTIVITAMIN WITH MINERALS) tablet Take 1 tablet by mouth daily.      . simvastatin (ZOCOR) 40 MG tablet Take 40 mg by mouth.     No current facility-administered medications for this visit.    PHYSICAL EXAMINATION: ECOG PERFORMANCE STATUS: 1 - Symptomatic but completely ambulatory  Filed Vitals:   03/10/15 1036  BP: 181/93  Pulse: 84  Temp: 98.2 F (36.8 C)  Resp: 18   Filed Weights   03/10/15 1036  Weight: 176 lb 8 oz (80.06 kg)  GENERAL:alert, no distress and comfortable SKIN: skin color, texture, turgor are normal, no rashes or significant lesions EYES: normal, Conjunctiva are pink and non-injected, sclera clear OROPHARYNX:no exudate, no erythema and lips, buccal mucosa, and tongue normal  NECK: supple, thyroid normal size, non-tender, without nodularity LYMPH:  no palpable lymphadenopathy in the cervical, axillary or inguinal LUNGS: clear to auscultation and percussion with normal breathing  effort HEART: regular rate & rhythm and no murmurs and no lower extremity edema ABDOMEN:abdomen soft, non-tender and normal bowel sounds Musculoskeletal:no cyanosis of digits and no clubbing  NEURO: alert & oriented x 3 with fluent speech, no focal motor/sensory deficits BREAST chest examination axillary exam normal (exam performed in the presence of a chaperone)  LABORATORY DATA:  I have reviewed the data as listed   Chemistry      Component Value Date/Time   NA 139 10/23/2014 1156   NA 143 07/28/2014 1038   K 3.3* 10/23/2014 1156   K 3.1* 07/28/2014 1038   CL 106 10/23/2014 1156   CL 105 08/10/2012 0908   CO2 25 10/23/2014 1156   CO2 27 07/28/2014 1038   BUN 13 10/23/2014 1156   BUN 13.3 07/28/2014 1038   CREATININE 0.57 10/23/2014 1156   CREATININE 0.7 07/28/2014 1038      Component Value Date/Time   CALCIUM 9.5 10/23/2014 1156   CALCIUM 10.2 07/28/2014 1038   ALKPHOS 116 07/28/2014 1038   ALKPHOS 88 05/17/2012 1006   AST 20 07/28/2014 1038   AST 23 05/17/2012 1006   ALT 27 07/28/2014 1038   ALT 28 05/17/2012 1006   BILITOT 0.43 07/28/2014 1038   BILITOT 0.4 05/17/2012 1006       Lab Results  Component Value Date   WBC 6.5 03/10/2015   HGB 13.1 03/10/2015   HCT 38.1 03/10/2015   MCV 90.1 03/10/2015   PLT 302 03/10/2015   NEUTROABS 2.9 03/10/2015    ASSESSMENT & PLAN:  Breast cancer, left breast Left breast invasive ductal carcinoma T2, N1, 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 Currently on Arimidex since 08/22/2012  Breast Cancer Surveillance: 1. Breast exam/ chest wall and axilla 03/10/15: Normal 2. Mammogram No role of mammograms since she had bilateral mastectomies   Aromatase inhibitor surveillance:  1. Bone density: T score -1.1 osteopenia  I discussed with her that the change in the guidelines no  longer require blood work routinely. Hence we will allow her to get blood work to be done by her primary care physicians. Return to clinic in 1 year for follow-up  No orders of the defined types were placed in this encounter.   The patient has a good understanding of the overall plan. she agrees with it. she will call with any problems that may develop before the next visit here.   Rulon Eisenmenger, MD

## 2015-10-05 DIAGNOSIS — E785 Hyperlipidemia, unspecified: Secondary | ICD-10-CM | POA: Insufficient documentation

## 2015-10-05 DIAGNOSIS — E559 Vitamin D deficiency, unspecified: Secondary | ICD-10-CM | POA: Insufficient documentation

## 2016-03-10 ENCOUNTER — Encounter: Payer: Self-pay | Admitting: Hematology and Oncology

## 2016-03-10 ENCOUNTER — Ambulatory Visit (HOSPITAL_BASED_OUTPATIENT_CLINIC_OR_DEPARTMENT_OTHER): Payer: Managed Care, Other (non HMO) | Admitting: Hematology and Oncology

## 2016-03-10 ENCOUNTER — Telehealth: Payer: Self-pay | Admitting: Hematology and Oncology

## 2016-03-10 VITALS — BP 181/92 | HR 92 | Temp 98.6°F | Resp 18 | Wt 172.1 lb

## 2016-03-10 DIAGNOSIS — M8580 Other specified disorders of bone density and structure, unspecified site: Secondary | ICD-10-CM

## 2016-03-10 DIAGNOSIS — C50412 Malignant neoplasm of upper-outer quadrant of left female breast: Secondary | ICD-10-CM | POA: Diagnosis not present

## 2016-03-10 DIAGNOSIS — E559 Vitamin D deficiency, unspecified: Secondary | ICD-10-CM

## 2016-03-10 MED ORDER — ANASTROZOLE 1 MG PO TABS: 1.0000 mg | ORAL_TABLET | Freq: Every day | ORAL | Status: DC

## 2016-03-10 NOTE — Assessment & Plan Note (Signed)
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 Currently on Arimidex since 08/22/2012  Breast Cancer Surveillance: 1. Breast exam/ chest wall and axilla 03/10/2016: Normal 2. Mammogram No role of mammograms since she had bilateral mastectomies  Aromatase inhibitor surveillance:  1. Bone density: T score -1.1 osteopenia  Return to clinic in 1 year for follow-up

## 2016-03-10 NOTE — Progress Notes (Signed)
Patient Care Team: [name], MD as PCP - General (Family Medicine)  DIAGNOSIS: Breast cancer of upper-outer quadrant of left female breast [facility])   Staging form: Breast, AJCC 7th Edition     Clinical: Stage IIB (T2, N1, cM0) - Unsigned     Pathologic: No stage assigned - Unsigned   SUMMARY OF ONCOLOGIC HISTORY:   Breast cancer of upper-outer quadrant of left female breast ([place])   11/14/2011 Surgery Bilateral mastectomy, prophylactic on the right, left breast IDC 3/18 lymph nodes positive with extracapsular extension ER 89%, PR 81%, HER-2 negative, Ki-67 79% T2 N1 A. stage IIB   12/13/2011 - 06/28/2012 Chemotherapy 4 cycles of FEC followed by 4 cycles of Taxotere   07/17/2012 - 08/22/2012 Radiation Therapy Adjuvant radiation therapy   08/22/2012 -  Anti-estrogen oral therapy Arimidex 1 mg daily    CHIEF COMPLIANT: Follow-up on Arimidex therapy, complaining of cutaneous lesions on the left chest wall  INTERVAL HISTORY: [name] Riley is a 65 year old with above-mentioned history of left breast cancer who is currently on Arimidex therapy and appears to be tolerating it fairly well. She does have hot flashes and muscle stiffness. She is also having issues with sexual drive. Her husband reports that they have not been able to enjoy sexual activity. It could be because of vaginal dryness or discomfort as well as lack of interest. She has noticed small lesions in the left chest wall that have come up over the past couple of months. She also has a lesion towards the axilla that has been there before which appears to have a sore with the scab.  REVIEW OF SYSTEMS:   Constitutional: Denies fevers, chills or abnormal weight loss Eyes: Denies blurriness of vision Ears, nose, mouth, throat, and face: Denies mucositis or sore throat Respiratory: Denies cough, dyspnea or wheezes Cardiovascular: Denies palpitation, chest discomfort Gastrointestinal:  Denies nausea, heartburn or change in bowel  habits Skin: Denies abnormal skin rashes Lymphatics: Denies new lymphadenopathy or easy bruising Neurological:Denies numbness, tingling or new weaknesses Behavioral/Psych: Mood is stable, no new changes  Extremities: No lower extremity edema Breast: Cutaneous lesions involving the left chest wall All other systems were reviewed with the patient and are negative.  I have reviewed the past medical history, past surgical history, social history and family history with the patient and they are unchanged from previous note.  ALLERGIES:  is allergic to contrast media and food.  MEDICATIONS:  Current Outpatient Prescriptions  Medication Sig Dispense Refill  . anastrozole (ARIMIDEX) 1 MG tablet Take 1 tablet (1 mg total) by mouth daily. 90 tablet 3  . aspirin 81 MG tablet Take 81 mg by mouth daily.      . cephALEXin (KEFLEX) 500 MG capsule Take 500 mg by mouth.    . Cholecalciferol (VITAMIN D-3 PO) Take 2,000 Int'l Units by mouth daily.    Marland Kitchen levothyroxine (SYNTHROID, LEVOTHROID) 75 MCG tablet Take 75 mcg by mouth daily.     . metoprolol (TOPROL-XL) 100 MG 24 hr tablet Take 100 mg by mouth daily.     . Multiple Vitamins-Minerals (MULTIVITAMIN WITH MINERALS) tablet Take 1 tablet by mouth daily.      . simvastatin (ZOCOR) 40 MG tablet Take 40 mg by mouth.     No current facility-administered medications for this visit.    PHYSICAL EXAMINATION: ECOG PERFORMANCE STATUS: 1 - Symptomatic but completely ambulatory  There were no vitals filed for this visit. There were no vitals filed for this visit.  GENERAL:alert,  no distress and comfortable SKIN: skin color, texture, turgor are normal, no rashes or significant lesions EYES: normal, Conjunctiva are pink and non-injected, sclera clear OROPHARYNX:no exudate, no erythema and lips, buccal mucosa, and tongue normal  NECK: supple, thyroid normal size, non-tender, without nodularity LYMPH:  no palpable lymphadenopathy in the cervical, axillary or  inguinal LUNGS: clear to auscultation and percussion with normal breathing effort HEART: regular rate & rhythm and no murmurs and no lower extremity edema ABDOMEN:abdomen soft, non-tender and normal bowel sounds MUSCULOSKELETAL:no cyanosis of digits and no clubbing  NEURO: alert & oriented x 3 with fluent speech, no focal motor/sensory deficits EXTREMITIES: No lower extremity edema BREAST: 3 cutaneous lesions involving the left chest wall below these ostectomy scar along with a large lesion towards axilla which are concerning for metastatic lesions.. No palpable axillary supraclavicular or infraclavicular adenopathy. (exam performed in the presence of a chaperone)  LABORATORY DATA:  I have reviewed the data as listed   Chemistry      Component Value Date/Time   NA 141 03/10/2015 1023   NA 139 10/23/2014 1156   K 3.2* 03/10/2015 1023   K 3.3* 10/23/2014 1156   CL 106 10/23/2014 1156   CL 105 08/10/2012 0908   CO2 27 03/10/2015 1023   CO2 25 10/23/2014 1156   BUN 11.2 03/10/2015 1023   BUN 13 10/23/2014 1156   CREATININE 0.8 03/10/2015 1023   CREATININE 0.57 10/23/2014 1156      Component Value Date/Time   CALCIUM 10.0 03/10/2015 1023   CALCIUM 9.5 10/23/2014 1156   ALKPHOS 138 03/10/2015 1023   ALKPHOS 88 05/17/2012 1006   AST 21 03/10/2015 1023   AST 23 05/17/2012 1006   ALT 26 03/10/2015 1023   ALT 28 05/17/2012 1006   BILITOT 0.49 03/10/2015 1023   BILITOT 0.4 05/17/2012 1006       Lab Results  Component Value Date   WBC 6.5 03/10/2015   HGB 13.1 03/10/2015   HCT 38.1 03/10/2015   MCV 90.1 03/10/2015   PLT 302 03/10/2015   NEUTROABS 2.9 03/10/2015   ASSESSMENT & PLAN:  Breast cancer of upper-outer quadrant of left female breast ([place]) Left breast invasive ductal carcinoma T2, N1, 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  Currently on Arimidex since 08/22/2012  Breast Cancer Surveillance: 1. Breast exam/ chest wall and axilla 03/10/2016: Cutaneous lesions beneath the left mastectomy scar line 3 of them were noted along with thickening and nodularity towards the axilla. I am concerned about cutaneous metastases. I will request Dr. [name] to obtain an excisional biopsy of these lesions. 2. Mammogram No role of mammograms since she had bilateral mastectomies  Aromatase inhibitor surveillance:  1. Bone density: T score -1.1 osteopenia 2. Decreased sexual drive and vaginal dryness: I encouraged her use coconut oil and lubricants.   Return to clinic in 1 year for follow-up or sooner if she has any recurrence  No orders of the defined types were placed in this encounter.   The patient has a good understanding of the overall plan. she agrees with it. she will call with any problems that may develop before the next visit here.   Rulon Eisenmenger, MD 03/10/2016

## 2016-03-10 NOTE — Telephone Encounter (Signed)
appt made and avs printed °

## 2016-03-16 ENCOUNTER — Other Ambulatory Visit: Payer: Self-pay | Admitting: Surgery

## 2016-03-22 ENCOUNTER — Other Ambulatory Visit: Payer: Self-pay | Admitting: *Deleted

## 2016-03-22 ENCOUNTER — Telehealth: Payer: Self-pay | Admitting: Hematology and Oncology

## 2016-03-22 DIAGNOSIS — C50412 Malignant neoplasm of upper-outer quadrant of left female breast: Secondary | ICD-10-CM

## 2016-03-22 NOTE — Telephone Encounter (Signed)
Spoke with patient to confirm 6/14 appt to discuss bone scan and CT to be scheduled by central radiology

## 2016-03-25 ENCOUNTER — Other Ambulatory Visit: Payer: Self-pay

## 2016-03-25 ENCOUNTER — Telehealth: Payer: Self-pay | Admitting: Hematology and Oncology

## 2016-03-25 DIAGNOSIS — C50412 Malignant neoplasm of upper-outer quadrant of left female breast: Secondary | ICD-10-CM

## 2016-03-25 NOTE — Telephone Encounter (Signed)
Added lab prior to 6/12 scans per 6/9 pof. Spoke with patient and patient to arrive 6/12 @ 10:15 am for 10:30 am lab then proceed to [facility] to start prep.

## 2016-03-28 ENCOUNTER — Ambulatory Visit (HOSPITAL_COMMUNITY): Payer: Managed Care, Other (non HMO)

## 2016-03-28 ENCOUNTER — Encounter (HOSPITAL_COMMUNITY)
Admission: RE | Admit: 2016-03-28 | Discharge: 2016-03-28 | Disposition: A | Discharge status: Home / Self Care | Payer: Managed Care, Other (non HMO) | Source: Ambulatory Visit | Attending: Hematology and Oncology | Admitting: Hematology and Oncology

## 2016-03-28 ENCOUNTER — Telehealth: Payer: Self-pay

## 2016-03-28 ENCOUNTER — Ambulatory Visit (HOSPITAL_COMMUNITY)
Admission: RE | Admit: 2016-03-28 | Discharge: 2016-03-28 | Disposition: A | Discharge status: Home / Self Care | Payer: Managed Care, Other (non HMO) | Source: Ambulatory Visit | Attending: Hematology and Oncology | Admitting: Hematology and Oncology

## 2016-03-28 ENCOUNTER — Other Ambulatory Visit (HOSPITAL_BASED_OUTPATIENT_CLINIC_OR_DEPARTMENT_OTHER): Payer: Managed Care, Other (non HMO)

## 2016-03-28 DIAGNOSIS — C50412 Malignant neoplasm of upper-outer quadrant of left female breast: Secondary | ICD-10-CM | POA: Insufficient documentation

## 2016-03-28 DIAGNOSIS — C7951 Secondary malignant neoplasm of bone: Secondary | ICD-10-CM | POA: Insufficient documentation

## 2016-03-28 LAB — CBC WITH DIFFERENTIAL/PLATELET
BASO%: 0.6 % (ref 0.0–2.0)
Basophils Absolute: 0 10*3/uL (ref 0.0–0.1)
EOS%: 3.9 % (ref 0.0–7.0)
Eosinophils Absolute: 0.3 10*3/uL (ref 0.0–0.5)
HCT: 38.9 % (ref 34.8–46.6)
HGB: 13.3 g/dL (ref 11.6–15.9)
LYMPH%: 32.7 % (ref 14.0–49.7)
MCH: 30.4 pg (ref 25.1–34.0)
MCHC: 34.1 g/dL (ref 31.5–36.0)
MCV: 89.3 fL (ref 79.5–101.0)
MONO#: 0.5 10*3/uL (ref 0.1–0.9)
MONO%: 7.9 % (ref 0.0–14.0)
NEUT#: 3.5 10*3/uL (ref 1.5–6.5)
NEUT%: 54.9 % (ref 38.4–76.8)
Platelets: 259 10*3/uL (ref 145–400)
RBC: 4.36 10*6/uL (ref 3.70–5.45)
RDW: 13 % (ref 11.2–14.5)
WBC: 6.4 10*3/uL (ref 3.9–10.3)
lymph#: 2.1 10*3/uL (ref 0.9–3.3)

## 2016-03-28 LAB — COMPREHENSIVE METABOLIC PANEL
ALT: 27 U/L (ref 0–55)
AST: 28 U/L (ref 5–34)
Albumin: 4.3 g/dL (ref 3.5–5.0)
Alkaline Phosphatase: 140 U/L (ref 40–150)
Anion Gap: 9 mEq/L (ref 3–11)
BUN: 10 mg/dL (ref 7.0–26.0)
CO2: 27 mEq/L (ref 22–29)
Calcium: 9.9 mg/dL (ref 8.4–10.4)
Chloride: 105 mEq/L (ref 98–109)
Creatinine: 0.7 mg/dL (ref 0.6–1.1)
EGFR: 86 mL/min/{1.73_m2} — ABNORMAL LOW (ref >90)
Glucose: 87 mg/dl (ref 70–140)
Potassium: 3.6 mEq/L (ref 3.5–5.1)
Sodium: 141 mEq/L (ref 136–145)
Total Bilirubin: 0.45 mg/dL (ref 0.20–1.20)
Total Protein: 7.7 g/dL (ref 6.4–8.3)

## 2016-03-28 MED ORDER — PREDNISONE 50 MG PO TABS: ORAL_TABLET | ORAL | Status: DC

## 2016-03-28 MED ORDER — TECHNETIUM TC 99M MEDRONATE IV KIT
25.0000 | PACK | Freq: Once | INTRAVENOUS | Status: AC | PRN
  Administered 2016-03-28: 25 via INTRAVENOUS

## 2016-03-28 NOTE — Telephone Encounter (Signed)
Received phone call from [facility] stating pt in their office for CT CAP with contrast; however, pt has allergy to contrast media.  Radiology asking how she should proceed.  Per Dr. Lindi Adie, pt to be rescheduled for CT when pt can undergo premedications as contrast is needed.  Pt rescheduled for tomorrow at 2:30pm.  Premeds called in to pt's pharmacy for Prednisone 50mg  13/7/1 hours prior to CT and pt instructed to take Benadryl 50mg  1 hour prior to procedure.  Exact times of premeds verbalized to pt and given in written form.  Pt instructed to remain NPO 4 hours prior to CT and to drink 1st bottle of contrast at 12:15pm followed by the 2nd bottle of contrast at 13:15.  Pt verbalized understanding of all instructions both verbal and written.  Pt instructed to call us with any questions, should they arise.

## 2016-03-29 ENCOUNTER — Ambulatory Visit (HOSPITAL_COMMUNITY)
Admission: RE | Admit: 2016-03-29 | Discharge: 2016-03-29 | Disposition: A | Discharge status: Home / Self Care | Payer: Managed Care, Other (non HMO) | Source: Ambulatory Visit | Attending: Hematology and Oncology | Admitting: Hematology and Oncology

## 2016-03-29 DIAGNOSIS — R918 Other nonspecific abnormal finding of lung field: Secondary | ICD-10-CM | POA: Insufficient documentation

## 2016-03-29 DIAGNOSIS — R93421 Abnormal radiologic findings on diagnostic imaging of right kidney: Secondary | ICD-10-CM | POA: Diagnosis not present

## 2016-03-29 DIAGNOSIS — C50412 Malignant neoplasm of upper-outer quadrant of left female breast: Secondary | ICD-10-CM | POA: Diagnosis not present

## 2016-03-29 DIAGNOSIS — M899 Disorder of bone, unspecified: Secondary | ICD-10-CM | POA: Insufficient documentation

## 2016-03-29 MED ORDER — IOPAMIDOL (ISOVUE-300) INJECTION 61%
100.0000 mL | Freq: Once | INTRAVENOUS | Status: AC | PRN
  Administered 2016-03-29: 100 mL via INTRAVENOUS

## 2016-03-29 MED ORDER — IOPAMIDOL (ISOVUE-300) INJECTION 61%: 100.0000 mL | Freq: Once | INTRAVENOUS | Status: DC | PRN

## 2016-03-30 ENCOUNTER — Ambulatory Visit (HOSPITAL_BASED_OUTPATIENT_CLINIC_OR_DEPARTMENT_OTHER): Payer: Managed Care, Other (non HMO) | Admitting: Hematology and Oncology

## 2016-03-30 ENCOUNTER — Telehealth: Payer: Self-pay | Admitting: Hematology and Oncology

## 2016-03-30 ENCOUNTER — Encounter: Payer: Self-pay | Admitting: Hematology and Oncology

## 2016-03-30 ENCOUNTER — Telehealth: Payer: Self-pay | Admitting: Pharmacist

## 2016-03-30 VITALS — BP 170/102 | HR 81 | Temp 98.2°F | Resp 20 | Ht 68.0 in | Wt 172.7 lb

## 2016-03-30 DIAGNOSIS — C44501 Unspecified malignant neoplasm of skin of breast: Secondary | ICD-10-CM | POA: Diagnosis not present

## 2016-03-30 DIAGNOSIS — C7951 Secondary malignant neoplasm of bone: Secondary | ICD-10-CM

## 2016-03-30 DIAGNOSIS — C50412 Malignant neoplasm of upper-outer quadrant of left female breast: Secondary | ICD-10-CM

## 2016-03-30 DIAGNOSIS — R911 Solitary pulmonary nodule: Secondary | ICD-10-CM | POA: Diagnosis not present

## 2016-03-30 MED ORDER — PALBOCICLIB 125 MG PO CAPS: 125.0000 mg | ORAL_CAPSULE | Freq: Every day | ORAL | Status: DC

## 2016-03-30 MED FILL — *IBRANCE 125 MG CAPSULE: 125 | 21 days supply | Qty: 21 | Fill #0

## 2016-03-30 NOTE — Telephone Encounter (Signed)
New Rx for Ibrance e-scribed to [facility]; Prior authorization required and submitted on 03/30/16

## 2016-03-30 NOTE — Assessment & Plan Note (Signed)
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 Currently on Arimidex since 08/22/2012  Subcutaneous nodule excision left chest: Infiltrating carcinoma breast primary, ER positive, PR negative  CT CAP and bone scan 03/29/2016: Lytic lesions T8 vertebral, T1 posterior element, subcutaneous nodule left lateral chest wall, nonspecific lung nodules; Bone scan: Mets to kull, left humerus, left eighth rib, T7/T8, sternum, left acetabulum.  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 HER-2 is pending and will be out tomorrow.  Goals of treatment: Palliation and prolongation of life. Patient understands that stage IV breast cancer cannot be cured but it can be managed with fairly long survivals. Recommendation: 1. Ibrance with Faslodex: I discussed with some benefits of the treatment  I discussed at length the risks and benefits of Palbociclib in combination with Faslodex. Adverse effects of Palbociclib include decreasing neutrophil count, pneumonia, blood clots in lungs as well as nausea and GI symptoms. Side effects of letrozole include hot flashes, muscle aches and pains, uterine bleeding/spotting/cancer, osteoporosis, risk of blood clots.  I gave her a voucher to get her first month of prescription. I will see her back in 2 weeks for toxicity check and follow-up.

## 2016-03-30 NOTE — Progress Notes (Signed)
Patient Care Team: [name], MD as PCP - General (Family Medicine)  DIAGNOSIS: Breast cancer of upper-outer quadrant of left female breast [facility])   Staging form: Breast, AJCC 7th Edition     Clinical: Stage IIB (T2, N1, cM0) - Unsigned     Pathologic: No stage assigned - Unsigned   SUMMARY OF ONCOLOGIC HISTORY:   Breast cancer of upper-outer quadrant of left female breast ([place])   11/14/2011 Surgery Bilateral mastectomy, prophylactic on the right, left breast IDC 3/18 lymph nodes positive with extracapsular extension ER 89%, PR 81%, HER-2 negative, Ki-67 79% T2 N1 A. stage IIB   12/13/2011 - 06/28/2012 Chemotherapy 4 cycles of FEC followed by 4 cycles of Taxotere   07/17/2012 - 08/22/2012 Radiation Therapy Adjuvant radiation therapy   08/22/2012 -  Anti-estrogen oral therapy Arimidex 1 mg daily   03/16/2016 Relapse/Recurrence Subcutaneous nodule excision left chest: Infiltrating carcinoma breast primary, ER positive, PR negative   03/29/2016 Imaging CT CAP and bone scan: Lytic lesions T8 vertebral, T1 posterior element, subcutaneous nodule left lateral chest wall, nonspecific lung nodules; Bone scan: Mets to kull, left humerus, left eighth rib, T7/T8, sternum, left acetabulum    CHIEF COMPLIANT: Follow-up to discuss the recent CT scans in the chest wall nodule excisional biopsy  INTERVAL HISTORY: [name] Riley is a 65 year old with above-mentioned history of bilateral mastectomies for left breast cancer was treated with adjuvant chemotherapy followed by radiation and has been on Arimidex therapy since November 2013. She noted subcutaneous nodules which were excised. She also had CT chest abdomen pelvis and bone scan is here today to discuss the results. Unfortunately the subcutaneous nodules are recurrent breast cancer. In the scans showed bone metastases in the vertebral bodies ribs acetabulum left humerus and started. In retrospect even in 2012 she had sternal lesions that were concerning  for metastatic disease but a PET CT scan did not show any activity in the circumflex was felt to be nonsignificant.  REVIEW OF SYSTEMS:   Constitutional: Denies fevers, chills or abnormal weight loss Eyes: Denies blurriness of vision Ears, nose, mouth, throat, and face: Denies mucositis or sore throat Respiratory: Denies cough, dyspnea or wheezes Cardiovascular: Denies palpitation, chest discomfort Gastrointestinal:  Denies nausea, heartburn or change in bowel habits Skin: Denies abnormal skin rashes Lymphatics: Denies new lymphadenopathy or easy bruising Neurological:Denies numbness, tingling or new weaknesses Behavioral/Psych: Mood is stable, no new changes  Extremities: No lower extremity edema Breast: Subcutaneous nodules All other systems were reviewed with the patient and are negative.  I have reviewed the past medical history, past surgical history, social history and family history with the patient and they are unchanged from previous note.  ALLERGIES:  is allergic to contrast media and food.  MEDICATIONS:  Current Outpatient Prescriptions  Medication Sig Dispense Refill  . aspirin 81 MG tablet Take 81 mg by mouth daily.      . Cholecalciferol (VITAMIN D-3 PO) Take 2,000 Int'l Units by mouth daily.    Marland Kitchen levothyroxine (SYNTHROID, LEVOTHROID) 75 MCG tablet Take 75 mcg by mouth daily.     . metoprolol (TOPROL-XL) 100 MG 24 hr tablet Take 100 mg by mouth daily.     . Multiple Vitamins-Minerals (MULTIVITAMIN WITH MINERALS) tablet Take 1 tablet by mouth daily.      . palbociclib (IBRANCE) 125 MG capsule Take 1 capsule (125 mg total) by mouth daily with breakfast. Take whole with food. 21 capsule 0   No current facility-administered medications for this visit.  PHYSICAL EXAMINATION: ECOG PERFORMANCE STATUS: 1 - Symptomatic but completely ambulatory  Filed Vitals:   03/30/16 1429  BP: 170/102  Pulse: 81  Temp: 98.2 F (36.8 C)  Resp: 20   Filed Weights   03/30/16 1429    Weight: 172 lb 11.2 oz (78.336 kg)    GENERAL:alert, no distress and comfortable SKIN: skin color, texture, turgor are normal, no rashes or significant lesions EYES: normal, Conjunctiva are pink and non-injected, sclera clear OROPHARYNX:no exudate, no erythema and lips, buccal mucosa, and tongue normal  NECK: supple, thyroid normal size, non-tender, without nodularity LYMPH:  no palpable lymphadenopathy in the cervical, axillary or inguinal LUNGS: clear to auscultation and percussion with normal breathing effort HEART: regular rate & rhythm and no murmurs and no lower extremity edema ABDOMEN:abdomen soft, non-tender and normal bowel sounds MUSCULOSKELETAL:no cyanosis of digits and no clubbing  NEURO: alert & oriented x 3 with fluent speech, no focal motor/sensory deficits EXTREMITIES: No lower extremity edema  LABORATORY DATA:  I have reviewed the data as listed   Chemistry      Component Value Date/Time   NA 141 03/28/2016 1030   NA 139 10/23/2014 1156   K 3.6 03/28/2016 1030   K 3.3* 10/23/2014 1156   CL 106 10/23/2014 1156   CL 105 08/10/2012 0908   CO2 27 03/28/2016 1030   CO2 25 10/23/2014 1156   BUN 10.0 03/28/2016 1030   BUN 13 10/23/2014 1156   CREATININE 0.7 03/28/2016 1030   CREATININE 0.57 10/23/2014 1156      Component Value Date/Time   CALCIUM 9.9 03/28/2016 1030   CALCIUM 9.5 10/23/2014 1156   ALKPHOS 140 03/28/2016 1030   ALKPHOS 88 05/17/2012 1006   AST 28 03/28/2016 1030   AST 23 05/17/2012 1006   ALT 27 03/28/2016 1030   ALT 28 05/17/2012 1006   BILITOT 0.45 03/28/2016 1030   BILITOT 0.4 05/17/2012 1006       Lab Results  Component Value Date   WBC 6.4 03/28/2016   HGB 13.3 03/28/2016   HCT 38.9 03/28/2016   MCV 89.3 03/28/2016   PLT 259 03/28/2016   NEUTROABS 3.5 03/28/2016     ASSESSMENT & PLAN:  Breast cancer of upper-outer quadrant of left female breast ([facility]) Left breast invasive ductal carcinoma T2, N1, M0 stage IIB 3 of 18 lymph  nodes positive with extracapsular extension ER 89% PR 81% HER-2 negative Ki-67 79% status post 4 cycles of FEC and 4 cycles of Taxotere and adjuvant radiation. Currently on Arimidex since 08/22/2012  Subcutaneous nodule excision left chest: Infiltrating carcinoma breast primary, ER positive, PR negative  CT CAP and bone scan 03/29/2016: Lytic lesions T8 vertebral, T1 posterior element, subcutaneous nodule left lateral chest wall, nonspecific lung nodules; Bone scan: Mets to kull, left humerus, left eighth rib, T7/T8, sternum, left acetabulum. (In retrospect, original breast MRI in 2002 revealed sternal lesions concerning for metastatic disease, PET CT scan did not reveal metastases)  Pathology and radiology review: I discussed with the patient that she has metastatic breast cancer with cutaneous nodules and bone metastases. HER-2 is pending and will be out tomorrow. It is stage IV disease  Goals of treatment: Palliation and prolongation of life. Patient understands that stage IV breast cancer cannot be cured but it can be managed with fairly long survivals. Recommendation: 1. Ibrance with Faslodex: I discussed the risks and benefits of the treatment  I discussed at length the risks and benefits of Palbociclib in combination with Faslodex.  Adverse effects of Palbociclib include decreasing neutrophil count, pneumonia, blood clots in lungs as well as nausea and GI symptoms. Side effects of Faslodex include injection site discomfort, hot flashes, muscle aches and pains.  Bone metastases: Xgeva Q monthly 12 followed by every 3 months I gave her a voucher to get her first month of prescription. I will see her back in 2 weeks for toxicity check and follow-up and to start Faslodex and Xgeva injections.    Orders Placed This Encounter  Procedures  . CBC with Differential    Standing Status: Standing     Number of Occurrences: 20     Standing Expiration Date: 03/30/2017   The patient has a good  understanding of the overall plan. she agrees with it. she will call with any problems that may develop before the next visit here.   Rulon Eisenmenger, MD 03/30/2016

## 2016-03-30 NOTE — Telephone Encounter (Signed)
appt made and avs printed °

## 2016-03-31 ENCOUNTER — Encounter: Payer: Self-pay | Admitting: Pharmacist

## 2016-03-31 NOTE — Progress Notes (Signed)
PA approved for Ibrance from 03/30/16 - 09/29/16.  I faxed the PA authorization of coverage to [facility]. Kennith Center, Pharm.D., CPP 03/31/2016@8 :58 AM

## 2016-04-04 ENCOUNTER — Telehealth: Payer: Self-pay | Admitting: Hematology and Oncology

## 2016-04-04 NOTE — Telephone Encounter (Signed)
Faxed pt records to [facility] [phone] °

## 2016-04-13 ENCOUNTER — Telehealth: Payer: Self-pay | Admitting: Hematology and Oncology

## 2016-04-13 ENCOUNTER — Other Ambulatory Visit: Payer: Self-pay

## 2016-04-13 ENCOUNTER — Ambulatory Visit: Payer: Self-pay | Admitting: Surgery

## 2016-04-13 ENCOUNTER — Ambulatory Visit (HOSPITAL_BASED_OUTPATIENT_CLINIC_OR_DEPARTMENT_OTHER): Payer: Managed Care, Other (non HMO) | Admitting: Hematology and Oncology

## 2016-04-13 ENCOUNTER — Other Ambulatory Visit (HOSPITAL_BASED_OUTPATIENT_CLINIC_OR_DEPARTMENT_OTHER): Payer: Managed Care, Other (non HMO)

## 2016-04-13 ENCOUNTER — Ambulatory Visit (HOSPITAL_BASED_OUTPATIENT_CLINIC_OR_DEPARTMENT_OTHER): Payer: Managed Care, Other (non HMO)

## 2016-04-13 ENCOUNTER — Encounter: Payer: Self-pay | Admitting: Hematology and Oncology

## 2016-04-13 ENCOUNTER — Telehealth: Payer: Self-pay | Admitting: *Deleted

## 2016-04-13 VITALS — BP 148/88 | HR 86 | Temp 97.8°F | Resp 18 | Ht 68.0 in | Wt 168.1 lb

## 2016-04-13 DIAGNOSIS — C44501 Unspecified malignant neoplasm of skin of breast: Secondary | ICD-10-CM | POA: Diagnosis not present

## 2016-04-13 DIAGNOSIS — C7951 Secondary malignant neoplasm of bone: Secondary | ICD-10-CM

## 2016-04-13 DIAGNOSIS — C50812 Malignant neoplasm of overlapping sites of left female breast: Secondary | ICD-10-CM | POA: Diagnosis not present

## 2016-04-13 DIAGNOSIS — Z5111 Encounter for antineoplastic chemotherapy: Secondary | ICD-10-CM | POA: Diagnosis not present

## 2016-04-13 DIAGNOSIS — C50412 Malignant neoplasm of upper-outer quadrant of left female breast: Secondary | ICD-10-CM

## 2016-04-13 LAB — CBC WITH DIFFERENTIAL/PLATELET
BASO%: 0.9 % (ref 0.0–2.0)
Basophils Absolute: 0 10*3/uL (ref 0.0–0.1)
EOS%: 1.8 % (ref 0.0–7.0)
Eosinophils Absolute: 0.1 10*3/uL (ref 0.0–0.5)
HCT: 35.9 % (ref 34.8–46.6)
HGB: 12.4 g/dL (ref 11.6–15.9)
LYMPH%: 46.5 % (ref 14.0–49.7)
MCH: 30.8 pg (ref 25.1–34.0)
MCHC: 34.5 g/dL (ref 31.5–36.0)
MCV: 89.3 fL (ref 79.5–101.0)
MONO#: 0.2 10*3/uL (ref 0.1–0.9)
MONO%: 5.3 % (ref 0.0–14.0)
NEUT#: 1.6 10*3/uL (ref 1.5–6.5)
NEUT%: 45.5 % (ref 38.4–76.8)
Platelets: 173 10*3/uL (ref 145–400)
RBC: 4.02 10*6/uL (ref 3.70–5.45)
RDW: 12.9 % (ref 11.2–14.5)
WBC: 3.4 10*3/uL — ABNORMAL LOW (ref 3.9–10.3)
lymph#: 1.6 10*3/uL (ref 0.9–3.3)
nRBC: 0 % (ref 0–0)

## 2016-04-13 MED ORDER — DENOSUMAB 120 MG/1.7ML ~~LOC~~ SOLN: 120.0000 mg | Freq: Once | SUBCUTANEOUS | Status: DC

## 2016-04-13 MED ORDER — METOPROLOL SUCCINATE ER 100 MG PO TB24: 100.0000 mg | ORAL_TABLET | Freq: Every day | ORAL | Status: DC

## 2016-04-13 MED ORDER — LIDOCAINE-PRILOCAINE 2.5-2.5 % EX CREA: TOPICAL_CREAM | CUTANEOUS | Status: DC

## 2016-04-13 MED ORDER — FULVESTRANT 250 MG/5ML IM SOLN
500.0000 mg | INTRAMUSCULAR | Status: DC
  Administered 2016-04-13: 500 mg via INTRAMUSCULAR
  Filled 2016-04-13: qty 10

## 2016-04-13 MED ORDER — LAPATINIB DITOSYLATE 250 MG PO TABS: 1250.0000 mg | ORAL_TABLET | Freq: Every day | ORAL | Status: DC

## 2016-04-13 MED FILL — LIDOCAINE-PRILOCAINE CREAM: 2.5-2.5 | 30 days supply | Qty: 30 | Fill #0

## 2016-04-13 MED FILL — METOPROLOL SUCC ER 100 MG T: 100 | 90 days supply | Qty: 90 | Fill #0

## 2016-04-13 NOTE — Telephone Encounter (Signed)
Per staff message and POF I have scheduled appts. Advised scheduler of appts. JMW  

## 2016-04-13 NOTE — Assessment & Plan Note (Signed)
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 Was on Arimidex since 08/22/2012 to 03/30/16  Subcutaneous nodule excision left chest: Infiltrating carcinoma breast primary, ER positive, PR negative, HER-2 positive  CT CAP and bone scan 03/29/2016: Lytic lesions T8 vertebral, T1 posterior element, subcutaneous nodule left lateral chest wall, nonspecific lung nodules; Bone scan: Mets to kull, left humerus, left eighth rib, T7/T8, sternum, left acetabulum. (In retrospect, original breast MRI in 2002 revealed sternal lesions concerning for metastatic disease, PET CT scan did not reveal metastases)  Goals of treatment: Palliation and prolongation of life. Patient understands that stage IV breast cancer cannot be cured but it can be managed with fairly long survivals.  Recommendation: Patient was started on Ibrance with Faslodex: However the HER-2 result came back positive. Because of this, I discussed with her that the treatment could be changed to either chemotherapy with Herceptin or antiestrogen therapy with Herceptin and lapatinib for dual anti-HER-2 blockade.  Bone metastases: Xgeva Q monthly 12 followed by every 3 months

## 2016-04-13 NOTE — Telephone Encounter (Signed)
per pof to sch pt appt-sch trmt-gave pt copy of avs-MW sch trmt

## 2016-04-13 NOTE — Patient Instructions (Addendum)

## 2016-04-13 NOTE — Progress Notes (Signed)
Patient Care Team: [name], MD as PCP - General (Family Medicine)  DIAGNOSIS: Breast cancer of upper-outer quadrant of left female breast [facility])   Staging form: Breast, AJCC 7th Edition     Clinical: Stage IIB (T2, N1, cM0) - Unsigned     Pathologic: No stage assigned - Unsigned   SUMMARY OF ONCOLOGIC HISTORY:   Breast cancer of upper-outer quadrant of left female breast ([place])   11/14/2011 Surgery Bilateral mastectomy, prophylactic on the right, left breast IDC 3/18 lymph nodes positive with extracapsular extension ER 89%, PR 81%, HER-2 negative, Ki-67 79% T2 N1 A. stage IIB   12/13/2011 - 06/28/2012 Chemotherapy 4 cycles of FEC followed by 4 cycles of Taxotere   07/17/2012 - 08/22/2012 Radiation Therapy Adjuvant radiation therapy   08/22/2012 -  Anti-estrogen oral therapy Arimidex 1 mg daily   03/16/2016 Relapse/Recurrence Subcutaneous nodule excision left chest: Infiltrating carcinoma breast primary, ER positive, PR negative   03/29/2016 Imaging CT CAP and bone scan: Lytic lesions T8 vertebral, T1 posterior element, subcutaneous nodule left lateral chest wall, nonspecific lung nodules; Bone scan: Mets to kull, left humerus, left eighth rib, T7/T8, sternum, left acetabulum    CHIEF COMPLIANT: Follow-up on current treatment  INTERVAL HISTORY: [name] Riley is a 65 year old with above-mentioned history of left breast cancer now metastatic disease with bone lesions who was started on Ibrance and Faslodex. It appears that she tolerated Ibrance fairly well. On a recent pathologic update, it appears that her tumor is HER-2 positive. Because of this we are here today to discuss changing the treatment.  REVIEW OF SYSTEMS:   Constitutional: Denies fevers, chills or abnormal weight loss Eyes: Denies blurriness of vision Ears, nose, mouth, throat, and face: Denies mucositis or sore throat Respiratory: Denies cough, dyspnea or wheezes Cardiovascular: Denies palpitation, chest  discomfort Gastrointestinal:  Denies nausea, heartburn or change in bowel habits Skin: Denies abnormal skin rashes Lymphatics: Denies new lymphadenopathy or easy bruising Neurological:Denies numbness, tingling or new weaknesses Behavioral/Psych: Mood is stable, no new changes  Extremities: No lower extremity edema Breast: Subcutaneous nodules from metastatic disease on chest wall All other systems were reviewed with the patient and are negative.  I have reviewed the past medical history, past surgical history, social history and family history with the patient and they are unchanged from previous note.  ALLERGIES:  is allergic to contrast media and food.  MEDICATIONS:  Current Outpatient Prescriptions  Medication Sig Dispense Refill  . aspirin 81 MG tablet Take 81 mg by mouth daily.      . Cholecalciferol (VITAMIN D-3 PO) Take 2,000 Int'l Units by mouth daily.    . lapatinib (TYKERB) 250 MG tablet Take 5 tablets (1,250 mg total) by mouth daily. 150 tablet 3  . levothyroxine (SYNTHROID, LEVOTHROID) 75 MCG tablet Take 75 mcg by mouth daily.     . metoprolol succinate (TOPROL-XL) 100 MG 24 hr tablet Take 1 tablet (100 mg total) by mouth daily. 90 tablet 3  . Multiple Vitamins-Minerals (MULTIVITAMIN WITH MINERALS) tablet Take 1 tablet by mouth daily.       No current facility-administered medications for this visit.    PHYSICAL EXAMINATION: ECOG PERFORMANCE STATUS: 1 - Symptomatic but completely ambulatory  Filed Vitals:   04/13/16 1443  BP: 148/88  Pulse: 86  Temp: 97.8 F (36.6 C)  Resp: 18   Filed Weights   04/13/16 1443  Weight: 168 lb 1.6 oz (76.25 kg)    GENERAL:alert, no distress and comfortable SKIN: skin color,  texture, turgor are normal, no rashes or significant lesions EYES: normal, Conjunctiva are pink and non-injected, sclera clear OROPHARYNX:no exudate, no erythema and lips, buccal mucosa, and tongue normal  NECK: supple, thyroid normal size, non-tender,  without nodularity LYMPH:  no palpable lymphadenopathy in the cervical, axillary or inguinal LUNGS: clear to auscultation and percussion with normal breathing effort HEART: regular rate & rhythm and no murmurs and no lower extremity edema ABDOMEN:abdomen soft, non-tender and normal bowel sounds MUSCULOSKELETAL:no cyanosis of digits and no clubbing  NEURO: alert & oriented x 3 with fluent speech, no focal motor/sensory deficits EXTREMITIES: No lower extremity edema  LABORATORY DATA:  I have reviewed the data as listed   Chemistry      Component Value Date/Time   NA 141 03/28/2016 1030   NA 139 10/23/2014 1156   K 3.6 03/28/2016 1030   K 3.3* 10/23/2014 1156   CL 106 10/23/2014 1156   CL 105 08/10/2012 0908   CO2 27 03/28/2016 1030   CO2 25 10/23/2014 1156   BUN 10.0 03/28/2016 1030   BUN 13 10/23/2014 1156   CREATININE 0.7 03/28/2016 1030   CREATININE 0.57 10/23/2014 1156      Component Value Date/Time   CALCIUM 9.9 03/28/2016 1030   CALCIUM 9.5 10/23/2014 1156   ALKPHOS 140 03/28/2016 1030   ALKPHOS 88 05/17/2012 1006   AST 28 03/28/2016 1030   AST 23 05/17/2012 1006   ALT 27 03/28/2016 1030   ALT 28 05/17/2012 1006   BILITOT 0.45 03/28/2016 1030   BILITOT 0.4 05/17/2012 1006       Lab Results  Component Value Date   WBC 3.4* 04/13/2016   HGB 12.4 04/13/2016   HCT 35.9 04/13/2016   MCV 89.3 04/13/2016   PLT 173 04/13/2016   NEUTROABS 1.6 04/13/2016     ASSESSMENT & PLAN:  Breast cancer of upper-outer quadrant of left female breast ([place]) Left breast invasive ductal carcinoma T2, N1, 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 Was on Arimidex since 08/22/2012 to 03/30/16  Subcutaneous nodule excision left chest: Infiltrating carcinoma breast primary, ER positive, PR negative, HER-2 positive  CT CAP and bone scan  03/29/2016: Lytic lesions T8 vertebral, T1 posterior element, subcutaneous nodule left lateral chest wall, nonspecific lung nodules; Bone scan: Mets to kull, left humerus, left eighth rib, T7/T8, sternum, left acetabulum. (In retrospect, original breast MRI in 2002 revealed sternal lesions concerning for metastatic disease, PET CT scan did not reveal metastases)  Goals of treatment: Palliation and prolongation of life. Patient understands that stage IV breast cancer cannot be cured but it can be managed with fairly long survivals.  Recommendation: Patient was started on Ibrance with Faslodex: However the HER-2 result came back positive. Because of this, I discussed with her that the treatment could be changed to either chemotherapy with Herceptin or antiestrogen therapy with Herceptin and lapatinib for dual anti-HER-2 blockade. We decided to change treatment to Herceptin plus lapatinib plus Faslodex For convenience sake we will give Herceptin every 4 weeks in combination with Faslodex. Plan to start Herceptin treatments in 2 weeks. I will request a port placement.  Bone metastases: Zometa Q monthly 12 followed by every 3 months   No orders of the defined types were placed in this encounter.   The patient has a good understanding of the overall plan.  she agrees with it. she will call with any problems that may develop before the next visit here.   Rulon Eisenmenger, MD 04/13/2016

## 2016-04-14 ENCOUNTER — Encounter: Payer: Self-pay | Admitting: Pharmacist

## 2016-04-14 ENCOUNTER — Other Ambulatory Visit: Payer: Self-pay | Admitting: *Deleted

## 2016-04-14 ENCOUNTER — Telehealth: Payer: Self-pay | Admitting: *Deleted

## 2016-04-14 ENCOUNTER — Telehealth: Payer: Self-pay | Admitting: Pharmacist

## 2016-04-14 MED FILL — *TYKERB 250 MG TABLET: 250 | 30 days supply | Qty: 150 | Fill #0 | Status: TO

## 2016-04-14 NOTE — Telephone Encounter (Signed)
Oral Chemotherapy Pharmacist Encounter  I spoke with patient for overview of new oral chemotherapy medication: Tykerb.   Counseled patient on administration, dosing, side effects, safe handling, and monitoring. Side effects include but not limited to: diarrhea, rash, cardiotoxicity.  [name] Riley voiced understanding and appreciation.  She has Imodium at home if needed for diarrhea and understands how to use it/dose.  All current questions answered.  She is aware [facility] had to order the drug and will be calling her when the Rx is ready for p/u.  She has ECHO scheduled for 04/21/16 at 1:30 pm in [place].  Will follow up in 1-2 weeks for adherence and toxicity management.   Thank you, Kennith Center, Pharm.D., CPP 04/14/2016@2 :44 PM Oral Chemotherapy Clinic

## 2016-04-14 NOTE — Progress Notes (Signed)
PA approved for Tykerb.  $0 Copay.  [facility] aware.  They will order drug & call pt to come p/u when ready. Kennith Center, Pharm.D., CPP 04/14/2016@2 :00 PM Oral Chemo Clinic

## 2016-04-14 NOTE — Telephone Encounter (Signed)
Called patient to let her know per Dr. Lindi Adie to finish taking Ibrance until finished. [facility] will call her when Tykerb is ready for p/u. She verbalized understanding.

## 2016-04-14 NOTE — Progress Notes (Signed)
Received notification from [facility] that pt needs PA for Tykerb. PA submitted on [email or web address] today and is pending.  Will f/u in next 24 hrs for decision. Kennith Center, Pharm.D., CPP 04/14/2016@10 :40 AM Oral Chemo Clinic

## 2016-04-20 ENCOUNTER — Encounter (HOSPITAL_BASED_OUTPATIENT_CLINIC_OR_DEPARTMENT_OTHER): Payer: Self-pay | Admitting: *Deleted

## 2016-04-20 ENCOUNTER — Encounter: Payer: Self-pay | Admitting: Hematology and Oncology

## 2016-04-20 NOTE — Progress Notes (Signed)
Pt's husband called inquiring about copay assistance.  We discussed the [employer] which I applied in her behalf.  She is approved for $25,000 for Herceptin from 04/20/16 to 04/19/17.  Emailed copy of approval letter to [place] in the billing department.  We also discussed the [place] however, pt exceeds the income qualifications for that grant.  Will see pt on 04/28/16 to give her a copy of her [place] approval letter.

## 2016-04-21 ENCOUNTER — Ambulatory Visit (INDEPENDENT_AMBULATORY_CARE_PROVIDER_SITE_OTHER): Payer: Managed Care, Other (non HMO)

## 2016-04-21 ENCOUNTER — Other Ambulatory Visit: Payer: Self-pay

## 2016-04-21 DIAGNOSIS — C50412 Malignant neoplasm of upper-outer quadrant of left female breast: Secondary | ICD-10-CM

## 2016-04-24 NOTE — Anesthesia Preprocedure Evaluation (Addendum)
Anesthesia Evaluation  Patient identified by MRN, date of birth, ID band Patient awake    Reviewed: Allergy & Precautions, H&P , NPO status , Patient's Chart, lab work & pertinent test results, reviewed documented beta blocker date and time   History of Anesthesia Complications (+) history of anesthetic complications  Airway Mallampati: I  TM Distance: >3 FB Neck ROM: Full    Dental  (+) Dental Advisory Given   Pulmonary    breath sounds clear to auscultation       Cardiovascular hypertension, Pt. on medications and Pt. on home beta blockers + CAD and + Past MI   Rhythm:Regular Rate:Normal     Neuro/Psych    GI/Hepatic   Endo/Other  Hypothyroidism   Renal/GU      Musculoskeletal   Abdominal   Peds  Hematology   Anesthesia Other Findings   Reproductive/Obstetrics                            Anesthesia Physical  Anesthesia Plan  ASA: III  Anesthesia Plan: General   Post-op Pain Management:    Induction: Intravenous  Airway Management Planned: LMA  Additional Equipment:   Intra-op Plan:   Post-operative Plan: Extubation in OR  Informed Consent:   Dental advisory given  Plan Discussed with: CRNA and Anesthesiologist  Anesthesia Plan Comments:         Anesthesia Quick Evaluation

## 2016-04-25 ENCOUNTER — Ambulatory Visit (HOSPITAL_BASED_OUTPATIENT_CLINIC_OR_DEPARTMENT_OTHER): Payer: Managed Care, Other (non HMO) | Admitting: Anesthesiology

## 2016-04-25 ENCOUNTER — Ambulatory Visit (HOSPITAL_COMMUNITY): Payer: Managed Care, Other (non HMO)

## 2016-04-25 ENCOUNTER — Observation Stay (HOSPITAL_COMMUNITY): Payer: Managed Care, Other (non HMO)

## 2016-04-25 ENCOUNTER — Encounter (HOSPITAL_BASED_OUTPATIENT_CLINIC_OR_DEPARTMENT_OTHER): Payer: Self-pay | Admitting: *Deleted

## 2016-04-25 ENCOUNTER — Observation Stay (HOSPITAL_BASED_OUTPATIENT_CLINIC_OR_DEPARTMENT_OTHER)
Admission: RE | Admit: 2016-04-25 | Discharge: 2016-04-26 | Disposition: A | Discharge status: Home / Self Care | Payer: Managed Care, Other (non HMO) | Source: Ambulatory Visit | Attending: Surgery | Admitting: Surgery

## 2016-04-25 ENCOUNTER — Encounter (HOSPITAL_COMMUNITY)
Admission: RE | Disposition: A | Discharge status: Home / Self Care | Payer: Self-pay | Source: Ambulatory Visit | Attending: Surgery

## 2016-04-25 DIAGNOSIS — Z853 Personal history of malignant neoplasm of breast: Secondary | ICD-10-CM | POA: Diagnosis not present

## 2016-04-25 DIAGNOSIS — C50912 Malignant neoplasm of unspecified site of left female breast: Principal | ICD-10-CM | POA: Insufficient documentation

## 2016-04-25 DIAGNOSIS — Z90710 Acquired absence of both cervix and uterus: Secondary | ICD-10-CM | POA: Diagnosis not present

## 2016-04-25 DIAGNOSIS — J939 Pneumothorax, unspecified: Secondary | ICD-10-CM

## 2016-04-25 DIAGNOSIS — Z91041 Radiographic dye allergy status: Secondary | ICD-10-CM | POA: Diagnosis not present

## 2016-04-25 DIAGNOSIS — Z800 Family history of malignant neoplasm of digestive organs: Secondary | ICD-10-CM | POA: Diagnosis not present

## 2016-04-25 DIAGNOSIS — Z833 Family history of diabetes mellitus: Secondary | ICD-10-CM | POA: Diagnosis not present

## 2016-04-25 DIAGNOSIS — Y9389 Activity, other specified: Secondary | ICD-10-CM | POA: Insufficient documentation

## 2016-04-25 DIAGNOSIS — Z95828 Presence of other vascular implants and grafts: Secondary | ICD-10-CM

## 2016-04-25 DIAGNOSIS — Z8249 Family history of ischemic heart disease and other diseases of the circulatory system: Secondary | ICD-10-CM | POA: Diagnosis not present

## 2016-04-25 DIAGNOSIS — Z79899 Other long term (current) drug therapy: Secondary | ICD-10-CM | POA: Diagnosis not present

## 2016-04-25 DIAGNOSIS — E785 Hyperlipidemia, unspecified: Secondary | ICD-10-CM | POA: Diagnosis not present

## 2016-04-25 DIAGNOSIS — Z7982 Long term (current) use of aspirin: Secondary | ICD-10-CM | POA: Diagnosis not present

## 2016-04-25 DIAGNOSIS — X58XXXA Exposure to other specified factors, initial encounter: Secondary | ICD-10-CM | POA: Insufficient documentation

## 2016-04-25 DIAGNOSIS — I10 Essential (primary) hypertension: Secondary | ICD-10-CM | POA: Diagnosis not present

## 2016-04-25 DIAGNOSIS — Z91018 Allergy to other foods: Secondary | ICD-10-CM | POA: Diagnosis not present

## 2016-04-25 DIAGNOSIS — I252 Old myocardial infarction: Secondary | ICD-10-CM | POA: Insufficient documentation

## 2016-04-25 DIAGNOSIS — I2510 Atherosclerotic heart disease of native coronary artery without angina pectoris: Secondary | ICD-10-CM | POA: Insufficient documentation

## 2016-04-25 DIAGNOSIS — E039 Hypothyroidism, unspecified: Secondary | ICD-10-CM | POA: Diagnosis not present

## 2016-04-25 DIAGNOSIS — R042 Hemoptysis: Secondary | ICD-10-CM

## 2016-04-25 DIAGNOSIS — Z923 Personal history of irradiation: Secondary | ICD-10-CM | POA: Insufficient documentation

## 2016-04-25 DIAGNOSIS — T8189XA Other complications of procedures, not elsewhere classified, initial encounter: Secondary | ICD-10-CM | POA: Diagnosis not present

## 2016-04-25 DIAGNOSIS — Z803 Family history of malignant neoplasm of breast: Secondary | ICD-10-CM | POA: Insufficient documentation

## 2016-04-25 DIAGNOSIS — Z9221 Personal history of antineoplastic chemotherapy: Secondary | ICD-10-CM | POA: Diagnosis not present

## 2016-04-25 DIAGNOSIS — C773 Secondary and unspecified malignant neoplasm of axilla and upper limb lymph nodes: Secondary | ICD-10-CM | POA: Diagnosis not present

## 2016-04-25 DIAGNOSIS — Z8041 Family history of malignant neoplasm of ovary: Secondary | ICD-10-CM | POA: Diagnosis not present

## 2016-04-25 HISTORY — PX: PORTACATH PLACEMENT: SHX2246

## 2016-04-25 LAB — ECHOCARDIOGRAM COMPLETE
Ao-asc: 33 cm
E decel time: 177 msec
E/e' ratio: 8.6
FS: 32 % (ref 28–44)
IVS/LV PW RATIO, ED: 0.96
LA ID, A-P, ES: 38 mm
LA diam end sys: 38 mm
LA diam index: 1.98 cm/m2
LA vol A4C: 67.2 ml
LA vol index: 44.3 mL/m2
LA vol: 85.2 mL
LV E/e' medial: 8.6
LV E/e'average: 8.6
LV PW d: 11.4 mm — AB (ref 0.6–1.1)
LV dias vol index: 48 mL/m2
LV dias vol: 93 mL (ref 46–106)
LV e' LATERAL: 8.12 cm/s
LV sys vol index: 20 mL/m2
LV sys vol: 38 mL (ref 14–42)
LVOT SV: 104 mL
LVOT VTI: 23.1 cm
LVOT area: 4.52 cm2
LVOT diameter: 24 mm
LVOT peak vel: 105 cm/s
MV Dec: 177
MV pk A vel: 93.1 m/s
MV pk E vel: 69.8 m/s
P 1/2 time: 559 ms
Simpson's disk: 59
Stroke v: 55 ml
TAPSE: 23 mm
TDI e' lateral: 8.12
TDI e' medial: 5.22

## 2016-04-25 SURGERY — INSERTION, TUNNELED CENTRAL VENOUS DEVICE, WITH PORT
Anesthesia: General | Site: Chest

## 2016-04-25 MED ORDER — SODIUM CHLORIDE 0.9 % IV SOLN
INTRAVENOUS | Status: DC
  Administered 2016-04-25: 14:00:00 via INTRAVENOUS

## 2016-04-25 MED ORDER — LAPATINIB DITOSYLATE 250 MG PO TABS: 1250.0000 mg | ORAL_TABLET | Freq: Every day | ORAL | Status: DC

## 2016-04-25 MED ORDER — FENTANYL CITRATE (PF) 100 MCG/2ML IJ SOLN
50.0000 ug | INTRAMUSCULAR | Status: DC | PRN
  Administered 2016-04-25: 50 ug via INTRAVENOUS

## 2016-04-25 MED ORDER — PROPOFOL 10 MG/ML IV BOLUS
INTRAVENOUS | Status: AC
  Filled 2016-04-25: qty 20

## 2016-04-25 MED ORDER — FENTANYL CITRATE (PF) 100 MCG/2ML IJ SOLN
INTRAMUSCULAR | Status: AC
Start: 2016-04-25 — End: 2016-04-25
  Filled 2016-04-25: qty 2

## 2016-04-25 MED ORDER — MORPHINE SULFATE (PF) 2 MG/ML IV SOLN
2.0000 mg | INTRAVENOUS | Status: DC | PRN
  Administered 2016-04-25: 2 mg via INTRAVENOUS
  Filled 2016-04-25: qty 1

## 2016-04-25 MED ORDER — DEXAMETHASONE SODIUM PHOSPHATE 4 MG/ML IJ SOLN
INTRAMUSCULAR | Status: DC | PRN
  Administered 2016-04-25: 10 mg via INTRAVENOUS

## 2016-04-25 MED ORDER — MIDAZOLAM HCL 2 MG/2ML IJ SOLN
1.0000 mg | INTRAMUSCULAR | Status: DC | PRN
  Administered 2016-04-25: 2 mg via INTRAVENOUS

## 2016-04-25 MED ORDER — BUPIVACAINE-EPINEPHRINE 0.25% -1:200000 IJ SOLN
INTRAMUSCULAR | Status: DC | PRN
  Administered 2016-04-25: 10 mL

## 2016-04-25 MED ORDER — ONDANSETRON 4 MG PO TBDP
4.0000 mg | ORAL_TABLET | Freq: Once | ORAL | Status: AC
  Administered 2016-04-25: 4 mg via ORAL

## 2016-04-25 MED ORDER — LACTATED RINGERS IV SOLN
INTRAVENOUS | Status: DC
  Administered 2016-04-25: 10 mL/h via INTRAVENOUS
  Administered 2016-04-25: 08:00:00 via INTRAVENOUS

## 2016-04-25 MED ORDER — ENOXAPARIN SODIUM 40 MG/0.4ML ~~LOC~~ SOLN
40.0000 mg | SUBCUTANEOUS | Status: DC
  Administered 2016-04-25: 40 mg via SUBCUTANEOUS
  Filled 2016-04-25: qty 0.4

## 2016-04-25 MED ORDER — PROPOFOL 10 MG/ML IV BOLUS
INTRAVENOUS | Status: DC | PRN
  Administered 2016-04-25: 200 mg via INTRAVENOUS

## 2016-04-25 MED ORDER — HEPARIN SOD (PORK) LOCK FLUSH 100 UNIT/ML IV SOLN
INTRAVENOUS | Status: AC
  Filled 2016-04-25: qty 5

## 2016-04-25 MED ORDER — LEVOTHYROXINE SODIUM 75 MCG PO TABS
75.0000 ug | ORAL_TABLET | Freq: Every day | ORAL | Status: DC
  Administered 2016-04-26: 75 ug via ORAL
  Filled 2016-04-25: qty 1

## 2016-04-25 MED ORDER — HYDROCODONE-ACETAMINOPHEN 5-325 MG PO TABS
1.0000 | ORAL_TABLET | ORAL | Status: DC | PRN
  Administered 2016-04-25 – 2016-04-26 (×3): 2 via ORAL
  Filled 2016-04-25 (×3): qty 2

## 2016-04-25 MED ORDER — PHENYLEPHRINE HCL 10 MG/ML IJ SOLN
INTRAMUSCULAR | Status: DC | PRN
  Administered 2016-04-25: 80 ug via INTRAVENOUS

## 2016-04-25 MED ORDER — ASPIRIN EC 81 MG PO TBEC
81.0000 mg | DELAYED_RELEASE_TABLET | Freq: Every day | ORAL | Status: DC
  Administered 2016-04-26: 81 mg via ORAL
  Filled 2016-04-25: qty 1

## 2016-04-25 MED ORDER — ONDANSETRON HCL 4 MG/2ML IJ SOLN
INTRAMUSCULAR | Status: AC
  Filled 2016-04-25: qty 2

## 2016-04-25 MED ORDER — FENTANYL CITRATE (PF) 100 MCG/2ML IJ SOLN
INTRAMUSCULAR | Status: AC
  Filled 2016-04-25: qty 2

## 2016-04-25 MED ORDER — OXYCODONE HCL 5 MG PO TABS
ORAL_TABLET | ORAL | Status: AC
Start: 2016-04-25 — End: 2016-04-25
  Filled 2016-04-25: qty 1

## 2016-04-25 MED ORDER — HEPARIN (PORCINE) IN NACL 2-0.9 UNIT/ML-% IJ SOLN
INTRAMUSCULAR | Status: DC | PRN
  Administered 2016-04-25: 1 via INTRAVENOUS

## 2016-04-25 MED ORDER — CEFAZOLIN SODIUM-DEXTROSE 2-3 GM-% IV SOLR
INTRAVENOUS | Status: DC | PRN
  Administered 2016-04-25: 2 g via INTRAVENOUS

## 2016-04-25 MED ORDER — BUPIVACAINE-EPINEPHRINE (PF) 0.25% -1:200000 IJ SOLN
INTRAMUSCULAR | Status: AC
  Filled 2016-04-25: qty 30

## 2016-04-25 MED ORDER — CHLORHEXIDINE GLUCONATE CLOTH 2 % EX PADS: 6.0000 | MEDICATED_PAD | Freq: Once | CUTANEOUS | Status: DC

## 2016-04-25 MED ORDER — HEPARIN SOD (PORK) LOCK FLUSH 100 UNIT/ML IV SOLN
INTRAVENOUS | Status: DC | PRN
  Administered 2016-04-25: 500 [IU] via INTRAVENOUS

## 2016-04-25 MED ORDER — MIDAZOLAM HCL 2 MG/2ML IJ SOLN
INTRAMUSCULAR | Status: AC
  Filled 2016-04-25: qty 2

## 2016-04-25 MED ORDER — LIDOCAINE 2% (20 MG/ML) 5 ML SYRINGE
INTRAMUSCULAR | Status: AC
  Filled 2016-04-25: qty 5

## 2016-04-25 MED ORDER — LIDOCAINE 2% (20 MG/ML) 5 ML SYRINGE
INTRAMUSCULAR | Status: DC | PRN
  Administered 2016-04-25: 60 mg via INTRAVENOUS

## 2016-04-25 MED ORDER — ONDANSETRON HCL 4 MG/2ML IJ SOLN
4.0000 mg | INTRAMUSCULAR | Status: DC | PRN
  Administered 2016-04-25: 4 mg via INTRAVENOUS

## 2016-04-25 MED ORDER — EPHEDRINE SULFATE 50 MG/ML IJ SOLN
INTRAMUSCULAR | Status: DC | PRN
  Administered 2016-04-25 (×2): 10 mg via INTRAVENOUS

## 2016-04-25 MED ORDER — GLYCOPYRROLATE 0.2 MG/ML IJ SOLN: 0.2000 mg | Freq: Once | INTRAMUSCULAR | Status: DC | PRN

## 2016-04-25 MED ORDER — PROMETHAZINE HCL 25 MG/ML IJ SOLN: 6.2500 mg | INTRAMUSCULAR | Status: DC | PRN

## 2016-04-25 MED ORDER — HEPARIN (PORCINE) IN NACL 2-0.9 UNIT/ML-% IJ SOLN
INTRAMUSCULAR | Status: AC
  Filled 2016-04-25: qty 500

## 2016-04-25 MED ORDER — DEXAMETHASONE SODIUM PHOSPHATE 10 MG/ML IJ SOLN
INTRAMUSCULAR | Status: AC
  Filled 2016-04-25: qty 1

## 2016-04-25 MED ORDER — FENTANYL CITRATE (PF) 100 MCG/2ML IJ SOLN
25.0000 ug | INTRAMUSCULAR | Status: DC | PRN
  Administered 2016-04-25 (×2): 25 ug via INTRAVENOUS

## 2016-04-25 MED ORDER — SCOPOLAMINE 1 MG/3DAYS TD PT72
1.0000 | MEDICATED_PATCH | Freq: Once | TRANSDERMAL | Status: AC | PRN
  Administered 2016-04-25: 1 via TRANSDERMAL

## 2016-04-25 MED ORDER — ONDANSETRON 4 MG PO TBDP
ORAL_TABLET | ORAL | Status: AC
  Filled 2016-04-25: qty 1

## 2016-04-25 MED ORDER — OXYCODONE HCL 5 MG PO TABS
5.0000 mg | ORAL_TABLET | Freq: Once | ORAL | Status: AC
Start: 2016-04-25 — End: 2016-04-25
  Administered 2016-04-25: 5 mg via ORAL

## 2016-04-25 MED ORDER — METOPROLOL SUCCINATE ER 100 MG PO TB24
100.0000 mg | ORAL_TABLET | Freq: Every day | ORAL | Status: DC
  Administered 2016-04-26: 100 mg via ORAL
  Filled 2016-04-25: qty 1

## 2016-04-25 MED ORDER — SCOPOLAMINE 1 MG/3DAYS TD PT72
MEDICATED_PATCH | TRANSDERMAL | Status: AC
  Filled 2016-04-25: qty 1

## 2016-04-25 MED ORDER — HYDROCODONE-ACETAMINOPHEN 5-325 MG PO TABS: 1.0000 | ORAL_TABLET | ORAL | Status: DC | PRN

## 2016-04-25 MED ORDER — CEFAZOLIN SODIUM-DEXTROSE 2-4 GM/100ML-% IV SOLN
INTRAVENOUS | Status: AC
  Filled 2016-04-25: qty 100

## 2016-04-25 SURGICAL SUPPLY — 53 items
BAG DECANTER FOR FLEXI CONT (MISCELLANEOUS) ×3 IMPLANT
BENZOIN TINCTURE PRP APPL 2/3 (GAUZE/BANDAGES/DRESSINGS) ×3 IMPLANT
BLADE SURG 11 STRL SS (BLADE) ×3 IMPLANT
BLADE SURG 15 STRL LF DISP TIS (BLADE) ×1 IMPLANT
BLADE SURG 15 STRL SS (BLADE) ×2
CANISTER SUCT 1200ML W/VALVE (MISCELLANEOUS) IMPLANT
CATH SINGLE LUMEN 9.6F (PORTABLE EQUIPMENT SUPPLIES) IMPLANT
CHLORAPREP W/TINT 26ML (MISCELLANEOUS) ×3 IMPLANT
CLEANER CAUTERY TIP 5X5 PAD (MISCELLANEOUS) ×1 IMPLANT
CLOSURE WOUND 1/2 X4 (GAUZE/BANDAGES/DRESSINGS) ×1
COVER BACK TABLE 60X90IN (DRAPES) ×3 IMPLANT
COVER MAYO STAND STRL (DRAPES) ×3 IMPLANT
DECANTER SPIKE VIAL GLASS SM (MISCELLANEOUS) ×3 IMPLANT
DRAPE C-ARM 42X72 X-RAY (DRAPES) ×3 IMPLANT
DRAPE LAPAROTOMY TRNSV 102X78 (DRAPE) ×3 IMPLANT
DRAPE UTILITY XL STRL (DRAPES) ×3 IMPLANT
DRSG TEGADERM 4X4.75 (GAUZE/BANDAGES/DRESSINGS) ×3 IMPLANT
ELECT REM PT RETURN 9FT ADLT (ELECTROSURGICAL) ×3
ELECTRODE REM PT RTRN 9FT ADLT (ELECTROSURGICAL) ×1 IMPLANT
GLOVE BIO SURGEON STRL SZ7 (GLOVE) ×3 IMPLANT
GLOVE BIOGEL PI IND STRL 7.0 (GLOVE) ×2 IMPLANT
GLOVE BIOGEL PI IND STRL 7.5 (GLOVE) ×1 IMPLANT
GLOVE BIOGEL PI INDICATOR 7.0 (GLOVE) ×4
GLOVE BIOGEL PI INDICATOR 7.5 (GLOVE) ×2
GLOVE ECLIPSE 6.5 STRL STRAW (GLOVE) ×3 IMPLANT
GOWN STRL REUS W/ TWL LRG LVL3 (GOWN DISPOSABLE) ×2 IMPLANT
GOWN STRL REUS W/TWL LRG LVL3 (GOWN DISPOSABLE) ×4
IV KIT MINILOC 20X1 SAFETY (NEEDLE) IMPLANT
KIT PORT POWER 8FR ISP CVUE (Catheter) ×3 IMPLANT
NDL SAFETY ECLIPSE 18X1.5 (NEEDLE) IMPLANT
NEEDLE HYPO 18GX1.5 SHARP (NEEDLE)
NEEDLE HYPO 22GX1.5 SAFETY (NEEDLE) ×3 IMPLANT
NEEDLE HYPO 25X1 1.5 SAFETY (NEEDLE) ×3 IMPLANT
NEEDLE SPNL 22GX3.5 QUINCKE BK (NEEDLE) IMPLANT
PACK BASIN DAY SURGERY FS (CUSTOM PROCEDURE TRAY) ×3 IMPLANT
PAD CLEANER CAUTERY TIP 5X5 (MISCELLANEOUS) ×2
PENCIL BUTTON HOLSTER BLD 10FT (ELECTRODE) ×3 IMPLANT
SLEEVE SCD COMPRESS KNEE MED (MISCELLANEOUS) ×3 IMPLANT
SPONGE GAUZE 2X2 8PLY STER LF (GAUZE/BANDAGES/DRESSINGS) ×1
SPONGE GAUZE 2X2 8PLY STRL LF (GAUZE/BANDAGES/DRESSINGS) ×2 IMPLANT
SPONGE GAUZE 4X4 12PLY STER LF (GAUZE/BANDAGES/DRESSINGS) IMPLANT
STRIP CLOSURE SKIN 1/2X4 (GAUZE/BANDAGES/DRESSINGS) ×2 IMPLANT
SUT MON AB 4-0 PC3 18 (SUTURE) ×3 IMPLANT
SUT PROLENE 2 0 CT2 30 (SUTURE) ×3 IMPLANT
SUT VIC AB 3-0 SH 27 (SUTURE) ×2
SUT VIC AB 3-0 SH 27X BRD (SUTURE) ×1 IMPLANT
SYR 5ML LUER SLIP (SYRINGE) ×3 IMPLANT
SYR CONTROL 10ML LL (SYRINGE) ×3 IMPLANT
TOWEL OR 17X24 6PK STRL BLUE (TOWEL DISPOSABLE) ×3 IMPLANT
TOWEL OR NON WOVEN STRL DISP B (DISPOSABLE) ×3 IMPLANT
TUBE CONNECTING 20'X1/4 (TUBING)
TUBE CONNECTING 20X1/4 (TUBING) IMPLANT
YANKAUER SUCT BULB TIP NO VENT (SUCTIONS) IMPLANT

## 2016-04-25 NOTE — Anesthesia Postprocedure Evaluation (Addendum)
Anesthesia Post Note  Patient: Berania Blumenshine Bakos  Procedure(s) Performed: Procedure(s) (LRB): INSERTION PORT-A-CATH LEFT CHEST (N/A)  Patient location during evaluation: PACU Anesthesia Type: General Level of consciousness: awake and alert Pain management: pain level controlled Vital Signs Assessment: post-procedure vital signs reviewed and stable Respiratory status: spontaneous breathing, nonlabored ventilation, respiratory function stable and patient connected to nasal cannula oxygen Cardiovascular status: blood pressure returned to baseline and stable Postop Assessment: no signs of nausea or vomiting Anesthetic complications: no Comments: Atelectasis on CXR, having some back pain, she is satting with pulse ox around high 88-89 when taking shallow breaths, with deep breathing and IS use it rises to 99% on RA, will continue attempting to wean O2 and improve sitting up position and further IS use, will repeat CXR around 11 if needed and not improving  12:10, Repeat CXR does now show an enlarged area of pneumothorax that is anterior and medial, likely as the cause of the difficulty with oxygenation initially and with pleuritic pain that is in her central mid back. Her aeration is overall improved actually in her left upper lobe.. We have been using Incentive spirometry and she is doing much better clinically, pain is controlled with PO oxycodone, she is satting > 96% on RA with no oxygen needed, denies nausea    Last Vitals:  Filed Vitals:   04/25/16 0930 04/25/16 0945  BP: 130/86 139/123  Pulse: 85 70  Temp:    Resp: 11 39    Last Pain:  Filed Vitals:   04/25/16 1002  PainSc: 3                  Zenaida Deed

## 2016-04-25 NOTE — Op Note (Signed)
Preop diagnosis: Recurrent breast cancer Postop diagnosis: Same Procedure performed left subclavian vein port placement Surgeon:Rima Blizzard K. Anesthesia: Gen. via LMA Indications: This is a 65 year old female who is 4 years status post bilateral mastectomies for left invasive ductal carcinoma. She had several positive lymph nodes. She underwent chemotherapy. Unfortunately she recently developed several nodules below the incision on her left side. One of these was biopsied and showed recurrent invasive ductal carcinoma. She presents now for port placement to resume chemotherapy. 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  Description of procedure: The patient brought to the operating room placed in supine position on the operative table with her arms tucked at her sides. A roll was placed behind her shoulders. After adequate level of general anesthesia was obtained her chest and neck were prepped with ChloraPrep and draped sterile fashion. A timeout was taken to ensure the proper patient and proper procedure. We marked her left clavicle as well as the jugular notch. Wheelchair the area below the left clavicle with 0.25% Marcaine with epinephrine. I attempted to use an 18-gauge needle to cannulate the subclavian vein. We had some difficulty identifying the vein. We did not see a good blood return into the needle. I used a finder needle to try to attempt to locate the left internal jugular vein. The patient has a fairly faint carotid pulse as her resting blood pressures only in the 90s. The finder needle hit the carotid artery wants. I held pressure for several minutes and no hematoma was noted. I then went back to the subclavian and was able to cannulate the subclavian vein after moving the needle more inferiorly. The wire was passed through the needle after seeing  good blood return that was nonpulsatile. Fluoroscopy was used to confirm that the wire passed down the right side of the mediastinum into the right atrium. The needle was removed. We created a subcutaneous pocket below the insertion site. We tunneled from the pocket to the insertion site. An 8 French ClearView port was then assembled and flushed and brought onto the field. This was placed in the pocket and tunneled up to the insertion site. The port was secured with 2-0 Prolene.  The length of the port was estimated and cut to length using fluoroscopy. The dilator and breakaway sheath were passed over the wire under fluoroscopic guidance. The wire and dilator were removed. The catheter was advanced through the breakaway sheath which was removed. Fluoroscopy confirmed that the tip of the catheter was at the cavoatrial junction. Blood easily aspirated to the port. The port flushed easily. We instilled concentrated heparin solution into the port. The wound was closed with 3-0 Vicryl and 4-0 Monocryl. Steri-Strips and clean dressings were applied. The patient was then extubated and brought to the recovery room in stable condition. All sponge, initially, and needle counts are correct.  [name] Riley. [name] Dover, MD, [facility] Surgery  General/ Trauma Surgery  04/25/2016 8:43 AM

## 2016-04-25 NOTE — H&P (Addendum)
[name] Riley is an 65 y.o. female.   Chief Complaint: Recurrent left breast cancer HPI: 65 yo female s/p bilateral mastectomy on 11/14/11 for left invasive ductal carcinoma with positive lymph nodes, now with recurrent left breast cancer.  She presents now for port placement for chemotherapy.  Past Medical History  Diagnosis Date  . Coronary artery disease 2001  . Hyperlipidemia   . PONV (postoperative nausea and vomiting)     gets sick from anesthesia  . Hypothyroidism   . Cancer [facility])     left breast cancer  . Breast cancer ([place]) 08/25/2011    L , invasive ductal carcinoma, ER/PR +,HER2 -  . Heart attack [facility]) 09/2000    Sep 25, 2000  --no intervention  . Hypertension   . History of chemotherapy comp. 08/22/2012    4 cycles of FEC and $ cycles of Taxotere  . Status post radiation therapy 07/09/12 - 08/22/2012    Left Breast, 60.4 gray  . Allergy     Past Surgical History  Procedure Laterality Date  . Tonsillectomy  1968  . Ovarian cyst surgery Right 1970  . Skin tags  05/09/1997    left axillary left neck skin tags  . Portacath placement  11/14/2011    Procedure: INSERTION PORT-A-CATH;  Surgeon: Imogene Burn. Satomi Buda, MD;  Location: [place];  Service: General;  Laterality: Right;  . Breast surgery  1998    removal of benign lump in rt breast  . Breast surgery  11/14/11    right simple mastectomy, left mrm  . Abdominal hysterectomy  1998    TAH, oophorectomy  . Appendectomy  1970  . Port-a-cath removal  08/30/2012    Procedure: REMOVAL PORT-A-CATH;  Surgeon: Imogene Burn. [name], MD;  Location: [place];  Service: General;  Laterality: Right;  port removal  . Mastectomy Bilateral     for left breast cancer    Family History  Problem Relation Age of Onset  . Hypertension Maternal Grandmother   . Diabetes Maternal Grandmother   . Cancer Father 2    lung cancer and Prostate Cancer  . Hypertension Mother   . Cancer Paternal Aunt     ovarian  . Cancer Cousin    breast, paternal cousin  . Cancer Paternal Uncle     stomach  . Cancer Paternal Grandfather     Esophagus  . Colon cancer Neg Hx    Social History:  reports that she has never smoked. She has never used smokeless tobacco. She reports that she does not drink alcohol or use illicit drugs.  Allergies:  Allergies  Allergen Reactions  . Contrast Media [Iodinated Diagnostic Agents] Shortness Of Breath and Rash  . Food Shortness Of Breath    Cantaloupe    Medications Prior to Admission  Medication Sig Dispense Refill  . aspirin 81 MG tablet Take 81 mg by mouth daily.      . Cholecalciferol (VITAMIN D-3 PO) Take 2,000 Int'l Units by mouth daily.    . lapatinib (TYKERB) 250 MG tablet Take 5 tablets (1,250 mg total) by mouth daily. 150 tablet 3  . levothyroxine (SYNTHROID, LEVOTHROID) 75 MCG tablet Take 75 mcg by mouth daily.     . metoprolol succinate (TOPROL-XL) 100 MG 24 hr tablet Take 1 tablet (100 mg total) by mouth daily. 90 tablet 3  . Multiple Vitamins-Minerals (MULTIVITAMIN WITH MINERALS) tablet Take 1 tablet by mouth daily.      Marland Kitchen lidocaine-prilocaine (EMLA) cream Apply to affected  area once 30 g 3    No results found for this or any previous visit (from the past 48 hour(s)). No results found.  ROS Constitutional: Denies fevers, chills or abnormal weight loss Eyes: Denies blurriness of vision Ears, nose, mouth, throat, and face: Denies mucositis or sore throat Respiratory: Denies cough, dyspnea or wheezes Cardiovascular: Denies palpitation, chest discomfort Gastrointestinal: Denies nausea, heartburn or change in bowel habits Skin: Denies abnormal skin rashes Lymphatics: Denies new lymphadenopathy or easy bruising Neurological:Denies numbness, tingling or new weaknesses Behavioral/Psych: Mood is stable, no new changes  Extremities: No lower extremity edema Breast: Subcutaneous nodules from metastatic disease on chest wall All other systems were reviewed with the patient  and are negative. Blood pressure 159/89, pulse 78, temperature 98.2 F (36.8 C), resp. rate 20, height 5' 8" (1.727 m), weight 75.479 kg (166 lb 6.4 oz), SpO2 97 %. Physical Exam  WDWN in NAD HEENT:  EOMI, sclera anicteric Neck:  No masses, no thyromegaly Lungs:  CTA bilaterally; normal respiratory effort Breasts:  Bilateral mastectomies/ left chest nodules below incisions CV:  Regular rate and rhythm; no murmurs Abd:  +bowel sounds, soft, non-tender, no masses Ext:  Well-perfused; no edema Skin:  Warm, dry; no sign of jaundice  Assessment/Plan Port placement.  The surgical procedure has been discussed with the patient.  Potential risks, benefits, alternative treatments, and expected outcomes have been explained.  All of the patient's questions at this time have been answered.  The likelihood of reaching the patient's treatment goal is good.  The patient understand the proposed surgical procedure and wishes to proceed.   Lazaria Schaben K., MD 04/25/2016, 7:19 AM   The port placement was difficult, but the initial post-op chest x-ray was normal.  However, the patient developed some brief hemoptysis and required oxygenation.  This improved with incentive spirometer, but repeat CXR 3 hours post-op showed findings suspicious for a small pneumothorax.  The patient seems to be feeling better, requiring only 1 liter of oxygen.  However, with these findings, the safe option would be to observe her in the hospital overnight to see if the pneumothorax enlarges.  There is no indication for chest tube placement at this time.  We will encourage IS and continue nasal cannula oxygen.  Discussed with patient and her husband.  Imogene Burn. [name], MD, [facility] Surgery  General/ Trauma Surgery  04/25/2016 12:45 PM

## 2016-04-25 NOTE — Anesthesia Procedure Notes (Signed)
Procedure Name: LMA Insertion Date/Time: 04/25/2016 7:44 AM Performed by: Maryella Shivers Pre-anesthesia Checklist: Patient identified, Emergency Drugs available, Suction available and Patient being monitored Patient Re-evaluated:Patient Re-evaluated prior to inductionOxygen Delivery Method: Circle system utilized Preoxygenation: Pre-oxygenation with 100% oxygen Intubation Type: IV induction Ventilation: Mask ventilation without difficulty LMA: LMA inserted LMA Size: 4.0 Number of attempts: 1 Airway Equipment and Method: Bite block Placement Confirmation: positive ETCO2 Tube secured with: Tape Dental Injury: Teeth and Oropharynx as per pre-operative assessment

## 2016-04-25 NOTE — Transfer of Care (Signed)
Immediate Anesthesia Transfer of Care Note  Patient: [name] Riley  Procedure(s) Performed: Procedure(s): INSERTION PORT-A-CATH LEFT CHEST (N/A)  Patient Location: PACU  Anesthesia Type:General  Level of Consciousness: sedated  Airway & Oxygen Therapy: Patient Spontanous Breathing and Patient connected to face mask oxygen  Post-op Assessment: Report given to RN and Post -op Vital signs reviewed and stable  Post vital signs: Reviewed and stable  Last Vitals:  Filed Vitals:   04/25/16 0645  BP: 159/89  Pulse: 78  Temp: 36.8 C  Resp: 20    Last Pain: There were no vitals filed for this visit.       Complications: No apparent anesthesia complications

## 2016-04-25 NOTE — Discharge Instructions (Signed)

## 2016-04-26 ENCOUNTER — Observation Stay (HOSPITAL_COMMUNITY): Payer: Managed Care, Other (non HMO)

## 2016-04-26 DIAGNOSIS — C50912 Malignant neoplasm of unspecified site of left female breast: Secondary | ICD-10-CM | POA: Diagnosis not present

## 2016-04-26 MED ORDER — HYDROCODONE-ACETAMINOPHEN 5-325 MG PO TABS: 1.0000 | ORAL_TABLET | ORAL | Status: DC | PRN

## 2016-04-26 NOTE — Progress Notes (Signed)
Pt accidentally removed peripheral IV, refused reinsertion saying "ill go home today anyway".

## 2016-04-26 NOTE — Discharge Summary (Signed)
Physician Discharge Summary  Patient ID: [name] Riley MRN: [id number] DOB/AGE: Aug 30, 1951 65 y.o.  Admit date: 04/25/2016 Discharge date: 04/26/2016  Admission Diagnoses:  Recurrent breast cancer    Left pneumothorax after port placement  Discharge Diagnoses: same Active Problems:   Pneumothorax on left Recurrent breast cancer  Discharged Condition: good  Hospital Course: Left subclavian vein port placement in outpatient surgery on 04/26/16.  The initial x-ray showed no pneumothorax. Post-op, she was having some shortness of breath and chest pain, so a second x-ray was obtained which showed a small pneumothorax.  She was admitted for observation.  She has been stable overnight and is only requiring PRN Vicodin for pain.  She is weaned off oxygen.  The latest x-ray shows slight decrease in the left pneumothorax.  She is working on her incentive spirometer.  Consults: None  Significant Diagnostic Studies: radiology: CXR: small left apical pneumothorax  Treatments: surgery: port placement 04/25/16  Discharge Exam: Blood pressure 118/66, pulse 74, temperature 98.1 F (36.7 C), temperature source Oral, resp. rate 16, height 5\' 8"  (1.727 m), weight 75.479 kg (166 lb 6.4 oz), SpO2 93 %. General appearance: alert, cooperative and no distress Resp: clear to auscultation bilaterally Chest wall: left sided chest wall tenderness Dressing clean and dry with no swelling or ecchymosis  Disposition: 01-Home or Self Care  Discharge Instructions    Call MD for:  persistant nausea and vomiting    Complete by:  As directed      Call MD for:  persistant nausea and vomiting    Complete by:  As directed      Call MD for:  redness, tenderness, or signs of infection (pain, swelling, redness, odor or green/yellow discharge around incision site)    Complete by:  As directed      Call MD for:  redness, tenderness, or signs of infection (pain, swelling, redness, odor or green/yellow discharge around  incision site)    Complete by:  As directed      Call MD for:  severe uncontrolled pain    Complete by:  As directed      Call MD for:  severe uncontrolled pain    Complete by:  As directed      Call MD for:  temperature >100.4    Complete by:  As directed      Call MD for:  temperature >100.4    Complete by:  As directed      Diet general    Complete by:  As directed      Diet general    Complete by:  As directed      Driving Restrictions    Complete by:  As directed   Do not drive while taking pain medications     Driving Restrictions    Complete by:  As directed   Do not drive while taking pain medications     Increase activity slowly    Complete by:  As directed      Increase activity slowly    Complete by:  As directed      May shower / Bathe    Complete by:  As directed      May shower / Bathe    Complete by:  As directed             Medication List    TAKE these medications        aspirin 81 MG tablet  Take 81 mg by mouth daily.  HYDROcodone-acetaminophen 5-325 MG tablet  Commonly known as:  NORCO/VICODIN  Take 1 tablet by mouth every 4 (four) hours as needed.     HYDROcodone-acetaminophen 5-325 MG tablet  Commonly known as:  NORCO/VICODIN  Take 1-2 tablets by mouth every 4 (four) hours as needed for moderate pain.     lapatinib 250 MG tablet  Commonly known as:  TYKERB  Take 5 tablets (1,250 mg total) by mouth daily.     levothyroxine 75 MCG tablet  Commonly known as:  SYNTHROID, LEVOTHROID  Take 75 mcg by mouth daily.     lidocaine-prilocaine cream  Commonly known as:  EMLA  Apply to affected area once     metoprolol succinate 100 MG 24 hr tablet  Commonly known as:  TOPROL-XL  Take 1 tablet (100 mg total) by mouth daily.     multivitamin with minerals tablet  Take 1 tablet by mouth daily.     VITAMIN D-3 PO  Take 2,000 Int'l Units by mouth daily.           Follow-up Information    Follow up with Bodi Palmeri K., MD. Schedule an  appointment as soon as possible for a visit in 3 weeks.   Specialty:  General Surgery   Contact information:   [place] [address] [phone]       Signed: Maia Petties. 04/26/2016, 12:26 PM

## 2016-04-26 NOTE — Progress Notes (Signed)
IV removed previously. Belongings packed. Transportation with husband.  AVS given to patient. Understanding verbalized.

## 2016-04-27 NOTE — Assessment & Plan Note (Signed)
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 Was on Arimidex since 08/22/2012 to 03/30/16  Subcutaneous nodule excision left chest: Infiltrating carcinoma breast primary, ER positive, PR negative, HER-2 positive  CT CAP and bone scan 03/29/2016: Lytic lesions T8 vertebral, T1 posterior element, subcutaneous nodule left lateral chest wall, nonspecific lung nodules; Bone scan: Mets to kull, left humerus, left eighth rib, T7/T8, sternum, left acetabulum. (In retrospect, original breast MRI in 2002 revealed sternal lesions concerning for metastatic disease, PET CT scan did not reveal metastases)  Goals of treatment: Palliation and prolongation of life. Patient understands that stage IV breast cancer cannot be cured but it can be managed with fairly long survivals.  Treatment Plan: Herceptin Q 4 weeks, Lapatinib, Faslodex, Zometa  Toxicities:  RTC in 4 weeks

## 2016-04-28 ENCOUNTER — Other Ambulatory Visit: Payer: Self-pay | Admitting: Hematology and Oncology

## 2016-04-28 ENCOUNTER — Telehealth: Payer: Self-pay | Admitting: Hematology and Oncology

## 2016-04-28 ENCOUNTER — Other Ambulatory Visit (HOSPITAL_BASED_OUTPATIENT_CLINIC_OR_DEPARTMENT_OTHER): Payer: Managed Care, Other (non HMO)

## 2016-04-28 ENCOUNTER — Ambulatory Visit (HOSPITAL_BASED_OUTPATIENT_CLINIC_OR_DEPARTMENT_OTHER): Payer: Managed Care, Other (non HMO) | Admitting: Hematology and Oncology

## 2016-04-28 ENCOUNTER — Ambulatory Visit (HOSPITAL_BASED_OUTPATIENT_CLINIC_OR_DEPARTMENT_OTHER): Payer: Managed Care, Other (non HMO)

## 2016-04-28 ENCOUNTER — Encounter: Payer: Self-pay | Admitting: Hematology and Oncology

## 2016-04-28 ENCOUNTER — Ambulatory Visit: Payer: Managed Care, Other (non HMO)

## 2016-04-28 VITALS — BP 157/84 | HR 86 | Temp 98.5°F | Resp 18 | Ht 68.0 in | Wt 168.3 lb

## 2016-04-28 DIAGNOSIS — C50412 Malignant neoplasm of upper-outer quadrant of left female breast: Secondary | ICD-10-CM

## 2016-04-28 DIAGNOSIS — C44501 Unspecified malignant neoplasm of skin of breast: Secondary | ICD-10-CM

## 2016-04-28 DIAGNOSIS — R911 Solitary pulmonary nodule: Secondary | ICD-10-CM | POA: Diagnosis not present

## 2016-04-28 DIAGNOSIS — C7951 Secondary malignant neoplasm of bone: Secondary | ICD-10-CM

## 2016-04-28 DIAGNOSIS — Z5112 Encounter for antineoplastic immunotherapy: Secondary | ICD-10-CM

## 2016-04-28 DIAGNOSIS — Z5111 Encounter for antineoplastic chemotherapy: Secondary | ICD-10-CM

## 2016-04-28 LAB — CBC WITH DIFFERENTIAL/PLATELET
BASO%: 1.1 % (ref 0.0–2.0)
Basophils Absolute: 0 10*3/uL (ref 0.0–0.1)
EOS%: 2 % (ref 0.0–7.0)
Eosinophils Absolute: 0.1 10*3/uL (ref 0.0–0.5)
HCT: 33.6 % — ABNORMAL LOW (ref 34.8–46.6)
HGB: 11.5 g/dL — ABNORMAL LOW (ref 11.6–15.9)
LYMPH%: 46.3 % (ref 14.0–49.7)
MCH: 31.5 pg (ref 25.1–34.0)
MCHC: 34.2 g/dL (ref 31.5–36.0)
MCV: 92.1 fL (ref 79.5–101.0)
MONO#: 0.5 10*3/uL (ref 0.1–0.9)
MONO%: 13.9 % (ref 0.0–14.0)
NEUT#: 1.3 10*3/uL — ABNORMAL LOW (ref 1.5–6.5)
NEUT%: 36.7 % — ABNORMAL LOW (ref 38.4–76.8)
Platelets: 195 10*3/uL (ref 145–400)
RBC: 3.65 10*6/uL — ABNORMAL LOW (ref 3.70–5.45)
RDW: 14.7 % — ABNORMAL HIGH (ref 11.2–14.5)
WBC: 3.5 10*3/uL — ABNORMAL LOW (ref 3.9–10.3)
lymph#: 1.6 10*3/uL (ref 0.9–3.3)

## 2016-04-28 MED ORDER — FULVESTRANT 250 MG/5ML IM SOLN
500.0000 mg | INTRAMUSCULAR | Status: DC
  Administered 2016-04-28: 500 mg via INTRAMUSCULAR
  Filled 2016-04-28: qty 10

## 2016-04-28 MED ORDER — ACETAMINOPHEN 325 MG PO TABS
ORAL_TABLET | ORAL | Status: AC
  Filled 2016-04-28: qty 2

## 2016-04-28 MED ORDER — SODIUM CHLORIDE 0.9% FLUSH
10.0000 mL | INTRAVENOUS | Status: DC | PRN
  Administered 2016-04-28: 10 mL
  Filled 2016-04-28: qty 10

## 2016-04-28 MED ORDER — ACETAMINOPHEN 325 MG PO TABS
650.0000 mg | ORAL_TABLET | Freq: Once | ORAL | Status: AC
  Administered 2016-04-28: 650 mg via ORAL

## 2016-04-28 MED ORDER — HEPARIN SOD (PORK) LOCK FLUSH 100 UNIT/ML IV SOLN
500.0000 [IU] | Freq: Once | INTRAVENOUS | Status: AC | PRN
  Administered 2016-04-28: 500 [IU]
  Filled 2016-04-28: qty 5

## 2016-04-28 MED ORDER — DIPHENHYDRAMINE HCL 25 MG PO CAPS
50.0000 mg | ORAL_CAPSULE | Freq: Once | ORAL | Status: AC
  Administered 2016-04-28: 50 mg via ORAL

## 2016-04-28 MED ORDER — DIPHENHYDRAMINE HCL 25 MG PO CAPS
ORAL_CAPSULE | ORAL | Status: AC
  Filled 2016-04-28: qty 2

## 2016-04-28 MED ORDER — TRASTUZUMAB CHEMO 150 MG IV SOLR
8.0000 mg/kg | Freq: Once | INTRAVENOUS | Status: AC
  Administered 2016-04-28: 609 mg via INTRAVENOUS
  Filled 2016-04-28: qty 29

## 2016-04-28 MED ORDER — ZOLEDRONIC ACID 4 MG/100ML IV SOLN
4.0000 mg | Freq: Once | INTRAVENOUS | Status: AC
Start: 2016-04-28 — End: 2016-04-28
  Administered 2016-04-28: 4 mg via INTRAVENOUS
  Filled 2016-04-28: qty 100

## 2016-04-28 MED ORDER — SODIUM CHLORIDE 0.9 % IV SOLN
Freq: Once | INTRAVENOUS | Status: AC
  Administered 2016-04-28: 10:00:00 via INTRAVENOUS

## 2016-04-28 NOTE — Progress Notes (Signed)
Patient Care Team: [name], MD as PCP - General (Family Medicine)  DIAGNOSIS: Breast cancer of upper-outer quadrant of left female breast [facility])   Staging form: Breast, AJCC 7th Edition     Clinical: Stage IIB (T2, N1, cM0) - Unsigned     Pathologic: No stage assigned - Unsigned   SUMMARY OF ONCOLOGIC HISTORY:   Breast cancer of upper-outer quadrant of left female breast ([place])   11/14/2011 Surgery Bilateral mastectomy, prophylactic on the right, left breast IDC 3/18 lymph nodes positive with extracapsular extension ER 89%, PR 81%, HER-2 negative, Ki-67 79% T2 N1 A. stage IIB   12/13/2011 - 06/28/2012 Chemotherapy 4 cycles of FEC followed by 4 cycles of Taxotere   07/17/2012 - 08/22/2012 Radiation Therapy Adjuvant radiation therapy   08/22/2012 - 03/16/2016 Anti-estrogen oral therapy Arimidex 1 mg daily   03/16/2016 Relapse/Recurrence Subcutaneous nodule excision left chest: Infiltrating carcinoma breast primary, ER positive, PR negative   03/29/2016 Imaging CT CAP and bone scan: Lytic lesions T8 vertebral, T1 posterior element, subcutaneous nodule left lateral chest wall, nonspecific lung nodules; Bone scan: Mets to kull, left humerus, left eighth rib, T7/T8, sternum, left acetabulum   04/28/2016 -  Chemotherapy Herceptin, lapatinib, anastrozole, Zometa every 4 weeks    CHIEF COMPLIANT: Cycle 1 Herceptin  INTERVAL HISTORY: [name] Riley is a 65 year old with above-mentioned history metastatic breast cancer HER-2 positive disease currently on Herceptin and lapatinib, anastrozole, Zometa every 4 weeks. She is here to receive her first dose of Herceptin. She denies any new problems or concerns. She had diarrhea with lapatinib for 1 day. It has since subsided.  REVIEW OF SYSTEMS:   Constitutional: Denies fevers, chills or abnormal weight loss Eyes: Denies blurriness of vision Ears, nose, mouth, throat, and face: Denies mucositis or sore throat Respiratory: Denies cough, dyspnea or  wheezes Cardiovascular: Denies palpitation, chest discomfort Gastrointestinal:  Diarrhea from lapatinib Skin: Denies abnormal skin rashes Lymphatics: Denies new lymphadenopathy or easy bruising Neurological:Denies numbness, tingling or new weaknesses Behavioral/Psych: Mood is stable, no new changes  Extremities: No lower extremity edema Breast:  denies any pain or lumps or nodules in either breasts All other systems were reviewed with the patient and are negative.  I have reviewed the past medical history, past surgical history, social history and family history with the patient and they are unchanged from previous note.  ALLERGIES:  is allergic to contrast media and food.  MEDICATIONS:  Current Outpatient Prescriptions  Medication Sig Dispense Refill  . aspirin 81 MG tablet Take 81 mg by mouth daily.      . Cholecalciferol (VITAMIN D-3 PO) Take 2,000 Int'l Units by mouth daily.    Marland Kitchen HYDROcodone-acetaminophen (NORCO/VICODIN) 5-325 MG tablet Take 1 tablet by mouth every 4 (four) hours as needed. 10 tablet 0  . HYDROcodone-acetaminophen (NORCO/VICODIN) 5-325 MG tablet Take 1-2 tablets by mouth every 4 (four) hours as needed for moderate pain. 30 tablet 0  . lapatinib (TYKERB) 250 MG tablet Take 5 tablets (1,250 mg total) by mouth daily. 150 tablet 3  . levothyroxine (SYNTHROID, LEVOTHROID) 75 MCG tablet Take 75 mcg by mouth daily.     Marland Kitchen lidocaine-prilocaine (EMLA) cream Apply to affected area once 30 g 3  . metoprolol succinate (TOPROL-XL) 100 MG 24 hr tablet Take 1 tablet (100 mg total) by mouth daily. 90 tablet 3  . Multiple Vitamins-Minerals (MULTIVITAMIN WITH MINERALS) tablet Take 1 tablet by mouth daily.       No current facility-administered medications for this visit.  PHYSICAL EXAMINATION: ECOG PERFORMANCE STATUS: 1 - Symptomatic but completely ambulatory  Filed Vitals:   04/28/16 0811  BP: 157/84  Pulse: 86  Temp: 98.5 F (36.9 C)  Resp: 18   Filed Weights    04/28/16 0811  Weight: 168 lb 4.8 oz (76.34 kg)    GENERAL:alert, no distress and comfortable SKIN: skin color, texture, turgor are normal, no rashes or significant lesions EYES: normal, Conjunctiva are pink and non-injected, sclera clear OROPHARYNX:no exudate, no erythema and lips, buccal mucosa, and tongue normal  NECK: supple, thyroid normal size, non-tender, without nodularity LYMPH:  no palpable lymphadenopathy in the cervical, axillary or inguinal LUNGS: clear to auscultation and percussion with normal breathing effort HEART: regular rate & rhythm and no murmurs and no lower extremity edema ABDOMEN:abdomen soft, non-tender and normal bowel sounds MUSCULOSKELETAL:no cyanosis of digits and no clubbing  NEURO: alert & oriented x 3 with fluent speech, no focal motor/sensory deficits EXTREMITIES: No lower extremity edema  LABORATORY DATA:  I have reviewed the data as listed   Chemistry      Component Value Date/Time   NA 141 03/28/2016 1030   NA 139 10/23/2014 1156   K 3.6 03/28/2016 1030   K 3.3* 10/23/2014 1156   CL 106 10/23/2014 1156   CL 105 08/10/2012 0908   CO2 27 03/28/2016 1030   CO2 25 10/23/2014 1156   BUN 10.0 03/28/2016 1030   BUN 13 10/23/2014 1156   CREATININE 0.7 03/28/2016 1030   CREATININE 0.57 10/23/2014 1156      Component Value Date/Time   CALCIUM 9.9 03/28/2016 1030   CALCIUM 9.5 10/23/2014 1156   ALKPHOS 140 03/28/2016 1030   ALKPHOS 88 05/17/2012 1006   AST 28 03/28/2016 1030   AST 23 05/17/2012 1006   ALT 27 03/28/2016 1030   ALT 28 05/17/2012 1006   BILITOT 0.45 03/28/2016 1030   BILITOT 0.4 05/17/2012 1006       Lab Results  Component Value Date   WBC 3.5* 04/28/2016   HGB 11.5* 04/28/2016   HCT 33.6* 04/28/2016   MCV 92.1 04/28/2016   PLT 195 04/28/2016   NEUTROABS 1.3* 04/28/2016     ASSESSMENT & PLAN:  Breast cancer of upper-outer quadrant of left female breast ([facility]) Left breast invasive ductal carcinoma T2, N1, M0 stage  IIB 3 of 18 lymph nodes positive with extracapsular extension ER 89% PR 81% HER-2 negative Ki-67 79% status post 4 cycles of FEC and 4 cycles of Taxotere and adjuvant radiation. Was on Arimidex since 08/22/2012 to 03/30/16  Subcutaneous nodule excision left chest: Infiltrating carcinoma breast primary, ER positive, PR negative, HER-2 positive  CT CAP and bone scan 03/29/2016: Lytic lesions T8 vertebral, T1 posterior element, subcutaneous nodule left lateral chest wall, nonspecific lung nodules; Bone scan: Mets to kull, left humerus, left eighth rib, T7/T8, sternum, left acetabulum. (In retrospect, original breast MRI in 2002 revealed sternal lesions concerning for metastatic disease, PET CT scan did not reveal metastases)  Goals of treatment: Palliation and prolongation of life. Patient understands that stage IV breast cancer cannot be cured but it can be managed with fairly long survivals.  Treatment Plan: Herceptin Q 4 weeks, Lapatinib, Faslodex, Zometa  Toxicities: 1. Diarrhea from lapatinib  RTC in 4 weeks  No orders of the defined types were placed in this encounter.   The patient has a good understanding of the overall plan. she agrees with it. she will call with any problems that may develop before the next  visit here.   Rulon Eisenmenger, MD 04/28/2016

## 2016-04-28 NOTE — Progress Notes (Signed)
Pt given Faslodex in infusion room.

## 2016-04-28 NOTE — Telephone Encounter (Signed)
appt made and avs printed °

## 2016-04-28 NOTE — Patient Instructions (Addendum)
[facility] Discharge Instructions for Patients Receiving Chemotherapy  Today you received the following chemotherapy agents:  Herceptin  To help prevent nausea and vomiting after your treatment, we encourage you to take your nausea medication as prescribed.   If you develop nausea and vomiting that is not controlled by your nausea medication, call the clinic.   BELOW ARE SYMPTOMS THAT SHOULD BE REPORTED IMMEDIATELY:  *FEVER GREATER THAN 100.5 F  *CHILLS WITH OR WITHOUT FEVER  NAUSEA AND VOMITING THAT IS NOT CONTROLLED WITH YOUR NAUSEA MEDICATION  *UNUSUAL SHORTNESS OF BREATH  *UNUSUAL BRUISING OR BLEEDING  TENDERNESS IN MOUTH AND THROAT WITH OR WITHOUT PRESENCE OF ULCERS  *URINARY PROBLEMS  *BOWEL PROBLEMS  UNUSUAL RASH Items with * indicate a potential emergency and should be followed up as soon as possible.  Feel free to call the clinic you have any questions or concerns. The clinic phone number is [phone].  Please show the [place] at check-in to the Emergency Department and triage nurse.    Zoledronic Acid injection (Hypercalcemia, Oncology) What is this medicine? ZOLEDRONIC ACID (ZOE le dron ik AS id) lowers the amount of calcium loss from bone. It is used to treat too much calcium in your blood from cancer. It is also used to prevent complications of cancer that has spread to the bone. This medicine may be used for other purposes; ask your health care provider or pharmacist if you have questions. What should I tell my health care provider before I take this medicine? They need to know if you have any of these conditions: -aspirin-sensitive asthma -cancer, especially if you are receiving medicines used to treat cancer -dental disease or wear dentures -infection -kidney disease -receiving corticosteroids like dexamethasone or prednisone -an unusual or allergic reaction to zoledronic acid, other medicines, foods, dyes, or  preservatives -pregnant or trying to get pregnant -breast-feeding How should I use this medicine? This medicine is for infusion into a vein. It is given by a health care professional in a hospital or clinic setting. Talk to your pediatrician regarding the use of this medicine in children. Special care may be needed. Overdosage: If you think you have taken too much of this medicine contact a poison control center or emergency room at once. NOTE: This medicine is only for you. Do not share this medicine with others. What if I miss a dose? It is important not to miss your dose. Call your doctor or health care professional if you are unable to keep an appointment. What may interact with this medicine? -certain antibiotics given by injection -NSAIDs, medicines for pain and inflammation, like ibuprofen or naproxen -some diuretics like bumetanide, furosemide -teriparatide -thalidomide This list may not describe all possible interactions. Give your health care provider a list of all the medicines, herbs, non-prescription drugs, or dietary supplements you use. Also tell them if you smoke, drink alcohol, or use illegal drugs. Some items may interact with your medicine. What should I watch for while using this medicine? Visit your doctor or health care professional for regular checkups. It may be some time before you see the benefit from this medicine. Do not stop taking your medicine unless your doctor tells you to. Your doctor may order blood tests or other tests to see how you are doing. Women should inform their doctor if they wish to become pregnant or think they might be pregnant. There is a potential for serious side effects to an unborn child. Talk to your health  care professional or pharmacist for more information. You should make sure that you get enough calcium and vitamin D while you are taking this medicine. Discuss the foods you eat and the vitamins you take with your health care  professional. Some people who take this medicine have severe bone, joint, and/or muscle pain. This medicine may also increase your risk for jaw problems or a broken thigh bone. Tell your doctor right away if you have severe pain in your jaw, bones, joints, or muscles. Tell your doctor if you have any pain that does not go away or that gets worse. Tell your dentist and dental surgeon that you are taking this medicine. You should not have major dental surgery while on this medicine. See your dentist to have a dental exam and fix any dental problems before starting this medicine. Take good care of your teeth while on this medicine. Make sure you see your dentist for regular follow-up appointments. What side effects may I notice from receiving this medicine? Side effects that you should report to your doctor or health care professional as soon as possible: -allergic reactions like skin rash, itching or hives, swelling of the face, lips, or tongue -anxiety, confusion, or depression -breathing problems -changes in vision -eye pain -feeling faint or lightheaded, falls -jaw pain, especially after dental work -mouth sores -muscle cramps, stiffness, or weakness -redness, blistering, peeling or loosening of the skin, including inside the mouth -trouble passing urine or change in the amount of urine Side effects that usually do not require medical attention (report to your doctor or health care professional if they continue or are bothersome): -bone, joint, or muscle pain -constipation -diarrhea -fever -hair loss -irritation at site where injected -loss of appetite -nausea, vomiting -stomach upset -trouble sleeping -trouble swallowing -weak or tired This list may not describe all possible side effects. Call your doctor for medical advice about side effects. You may report side effects to FDA at 1-800-FDA-1088. Where should I keep my medicine? This drug is given in a hospital or clinic and will not  be stored at home. NOTE: This sheet is a summary. It may not cover all possible information. If you have questions about this medicine, talk to your doctor, pharmacist, or health care provider.    2016, Elsevier/Gold Standard. (2014-03-01 14:19:39) Trastuzumab injection for infusion What is this medicine? TRASTUZUMAB (tras TOO zoo mab) is a monoclonal antibody. It is used to treat breast cancer and stomach cancer. This medicine may be used for other purposes; ask your health care provider or pharmacist if you have questions. What should I tell my health care provider before I take this medicine? They need to know if you have any of these conditions: -heart disease -heart failure -infection (especially a virus infection such as chickenpox, cold sores, or herpes) -lung or breathing disease, like asthma -recent or ongoing radiation therapy -an unusual or allergic reaction to trastuzumab, benzyl alcohol, or other medications, foods, dyes, or preservatives -pregnant or trying to get pregnant -breast-feeding How should I use this medicine? This drug is given as an infusion into a vein. It is administered in a hospital or clinic by a specially trained health care professional. Talk to your pediatrician regarding the use of this medicine in children. This medicine is not approved for use in children. Overdosage: If you think you have taken too much of this medicine contact a poison control center or emergency room at once. NOTE: This medicine is only for you. Do not share this  medicine with others. What if I miss a dose? It is important not to miss a dose. Call your doctor or health care professional if you are unable to keep an appointment. What may interact with this medicine? -doxorubicin -warfarin This list may not describe all possible interactions. Give your health care provider a list of all the medicines, herbs, non-prescription drugs, or dietary supplements you use. Also tell them if  you smoke, drink alcohol, or use illegal drugs. Some items may interact with your medicine. What should I watch for while using this medicine? Visit your doctor for checks on your progress. Report any side effects. Continue your course of treatment even though you feel ill unless your doctor tells you to stop. Call your doctor or health care professional for advice if you get a fever, chills or sore throat, or other symptoms of a cold or flu. Do not treat yourself. Try to avoid being around people who are sick. You may experience fever, chills and shaking during your first infusion. These effects are usually mild and can be treated with other medicines. Report any side effects during the infusion to your health care professional. Fever and chills usually do not happen with later infusions. Do not become pregnant while taking this medicine or for 7 months after stopping it. Women should inform their doctor if they wish to become pregnant or think they might be pregnant. Women of child-bearing potential will need to have a negative pregnancy test before starting this medicine. There is a potential for serious side effects to an unborn child. Talk to your health care professional or pharmacist for more information. Do not breast-feed an infant while taking this medicine or for 7 months after stopping it. Women must use effective birth control with this medicine. What side effects may I notice from receiving this medicine? Side effects that you should report to your doctor or other health care professional as soon as possible: -breathing difficulties -chest pain or palpitations -cough -dizziness or fainting -fever or chills, sore throat -skin rash, itching or hives -swelling of the legs or ankles -unusually weak or tired Side effects that usually do not require medical attention (report to your doctor or other health care professional if they continue or are bothersome): -loss of  appetite -headache -muscle aches -nausea This list may not describe all possible side effects. Call your doctor for medical advice about side effects. You may report side effects to FDA at 1-800-FDA-1088. Where should I keep my medicine? This drug is given in a hospital or clinic and will not be stored at home. NOTE: This sheet is a summary. It may not cover all possible information. If you have questions about this medicine, talk to your doctor, pharmacist, or health care provider.    2016, Elsevier/Gold Standard. (2015-01-09 11:49:32) Fulvestrant injection What is this medicine? FULVESTRANT (ful VES trant) blocks the effects of estrogen. It is used to treat breast cancer. This medicine may be used for other purposes; ask your health care provider or pharmacist if you have questions. What should I tell my health care provider before I take this medicine? They need to know if you have any of these conditions: -bleeding problems -liver disease -low levels of platelets in the blood -an unusual or allergic reaction to fulvestrant, other medicines, foods, dyes, or preservatives -pregnant or trying to get pregnant -breast-feeding How should I use this medicine? This medicine is for injection into a muscle. It is usually given by a health care professional  in a hospital or clinic setting. Talk to your pediatrician regarding the use of this medicine in children. Special care may be needed. Overdosage: If you think you have taken too much of this medicine contact a poison control center or emergency room at once. NOTE: This medicine is only for you. Do not share this medicine with others. What if I miss a dose? It is important not to miss your dose. Call your doctor or health care professional if you are unable to keep an appointment. What may interact with this medicine? -medicines that treat or prevent blood clots like warfarin, enoxaparin, and dalteparin This list may not describe all  possible interactions. Give your health care provider a list of all the medicines, herbs, non-prescription drugs, or dietary supplements you use. Also tell them if you smoke, drink alcohol, or use illegal drugs. Some items may interact with your medicine. What should I watch for while using this medicine? Your condition will be monitored carefully while you are receiving this medicine. You will need important blood work done while you are taking this medicine. Do not become pregnant while taking this medicine or for at least 1 year after stopping it. Women of child-bearing potential will need to have a negative pregnancy test before starting this medicine. Women should inform their doctor if they wish to become pregnant or think they might be pregnant. There is a potential for serious side effects to an unborn child. Men should inform their doctors if they wish to father a child. This medicine may lower sperm counts. Talk to your health care professional or pharmacist for more information. Do not breast-feed an infant while taking this medicine or for 1 year after the last dose. What side effects may I notice from receiving this medicine? Side effects that you should report to your doctor or health care professional as soon as possible: -allergic reactions like skin rash, itching or hives, swelling of the face, lips, or tongue -feeling faint or lightheaded, falls -pain, tingling, numbness, or weakness in the legs -signs and symptoms of infection like fever or chills; cough; flu-like symptoms; sore throat -vaginal bleeding Side effects that usually do not require medical attention (report to your doctor or health care professional if they continue or are bothersome): -aches, pains -constipation -diarrhea -headache -hot flashes -nausea, vomiting -pain at site where injected -stomach pain This list may not describe all possible side effects. Call your doctor for medical advice about side effects.  You may report side effects to FDA at 1-800-FDA-1088. Where should I keep my medicine? This drug is given in a hospital or clinic and will not be stored at home. NOTE: This sheet is a summary. It may not cover all possible information. If you have questions about this medicine, talk to your doctor, pharmacist, or health care provider.    2016, Elsevier/Gold Standard. (2015-05-01 11:03:55)

## 2016-05-11 ENCOUNTER — Ambulatory Visit: Payer: Managed Care, Other (non HMO)

## 2016-05-24 NOTE — Assessment & Plan Note (Signed)
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 Was on Arimidex since 08/22/2012 to 03/30/16  Subcutaneous nodule excision left chest: Infiltrating carcinoma breast primary, ER positive, PR negative, HER-2 positive  CT CAP and bone scan 03/29/2016: Lytic lesions T8 vertebral, T1 posterior element, subcutaneous nodule left lateral chest wall, nonspecific lung nodules; Bone scan: Mets to kull, left humerus, left eighth rib, T7/T8, sternum, left acetabulum. (In retrospect, original breast MRI in 2002 revealed sternal lesions concerning for metastatic disease, PET CT scan did not reveal metastases)  Goals of treatment: Palliation and prolongation of life. Patient understands that stage IV breast cancer cannot be cured but it can be managed with fairly long survivals.  Treatment Plan: Herceptin Q 4 weeks, Lapatinib, Faslodex, Zometa, started 04/28/2016, today is cycle 2  Toxicities: 1. Diarrhea from lapatinib  RTC in 4 weeks

## 2016-05-25 ENCOUNTER — Ambulatory Visit: Payer: Managed Care, Other (non HMO)

## 2016-05-25 ENCOUNTER — Encounter: Payer: Self-pay | Admitting: Hematology and Oncology

## 2016-05-25 ENCOUNTER — Ambulatory Visit (HOSPITAL_BASED_OUTPATIENT_CLINIC_OR_DEPARTMENT_OTHER): Payer: Managed Care, Other (non HMO)

## 2016-05-25 ENCOUNTER — Ambulatory Visit (HOSPITAL_BASED_OUTPATIENT_CLINIC_OR_DEPARTMENT_OTHER): Payer: Managed Care, Other (non HMO) | Admitting: Hematology and Oncology

## 2016-05-25 ENCOUNTER — Telehealth: Payer: Self-pay | Admitting: Hematology and Oncology

## 2016-05-25 ENCOUNTER — Other Ambulatory Visit (HOSPITAL_BASED_OUTPATIENT_CLINIC_OR_DEPARTMENT_OTHER): Payer: Managed Care, Other (non HMO)

## 2016-05-25 DIAGNOSIS — C7951 Secondary malignant neoplasm of bone: Secondary | ICD-10-CM

## 2016-05-25 DIAGNOSIS — Z5112 Encounter for antineoplastic immunotherapy: Secondary | ICD-10-CM

## 2016-05-25 DIAGNOSIS — R197 Diarrhea, unspecified: Secondary | ICD-10-CM

## 2016-05-25 DIAGNOSIS — C44501 Unspecified malignant neoplasm of skin of breast: Secondary | ICD-10-CM

## 2016-05-25 DIAGNOSIS — C50412 Malignant neoplasm of upper-outer quadrant of left female breast: Secondary | ICD-10-CM

## 2016-05-25 LAB — CBC WITH DIFFERENTIAL/PLATELET
BASO%: 1 % (ref 0.0–2.0)
Basophils Absolute: 0.1 10*3/uL (ref 0.0–0.1)
EOS%: 12.4 % — ABNORMAL HIGH (ref 0.0–7.0)
Eosinophils Absolute: 0.7 10*3/uL — ABNORMAL HIGH (ref 0.0–0.5)
HCT: 37.4 % (ref 34.8–46.6)
HGB: 12.5 g/dL (ref 11.6–15.9)
LYMPH%: 34.2 % (ref 14.0–49.7)
MCH: 31 pg (ref 25.1–34.0)
MCHC: 33.4 g/dL (ref 31.5–36.0)
MCV: 92.8 fL (ref 79.5–101.0)
MONO#: 0.6 10*3/uL (ref 0.1–0.9)
MONO%: 10 % (ref 0.0–14.0)
NEUT#: 2.5 10*3/uL (ref 1.5–6.5)
NEUT%: 42.4 % (ref 38.4–76.8)
Platelets: 293 10*3/uL (ref 145–400)
RBC: 4.03 10*6/uL (ref 3.70–5.45)
RDW: 16.1 % — ABNORMAL HIGH (ref 11.2–14.5)
WBC: 5.9 10*3/uL (ref 3.9–10.3)
lymph#: 2 10*3/uL (ref 0.9–3.3)

## 2016-05-25 LAB — COMPREHENSIVE METABOLIC PANEL
ALT: 29 U/L (ref 0–55)
AST: 30 U/L (ref 5–34)
Albumin: 4 g/dL (ref 3.5–5.0)
Alkaline Phosphatase: 259 U/L — ABNORMAL HIGH (ref 40–150)
Anion Gap: 10 mEq/L (ref 3–11)
BUN: 10.3 mg/dL (ref 7.0–26.0)
CO2: 26 mEq/L (ref 22–29)
Calcium: 9.3 mg/dL (ref 8.4–10.4)
Chloride: 106 mEq/L (ref 98–109)
Creatinine: 0.7 mg/dL (ref 0.6–1.1)
EGFR: >90 mL/min/{1.73_m2} (ref >90)
Glucose: 80 mg/dl (ref 70–140)
Potassium: 3.3 mEq/L — ABNORMAL LOW (ref 3.5–5.1)
Sodium: 142 mEq/L (ref 136–145)
Total Bilirubin: 0.51 mg/dL (ref 0.20–1.20)
Total Protein: 7.4 g/dL (ref 6.4–8.3)

## 2016-05-25 MED ORDER — TRASTUZUMAB CHEMO 150 MG IV SOLR
6.0000 mg/kg | Freq: Once | INTRAVENOUS | Status: AC
  Administered 2016-05-25: 462 mg via INTRAVENOUS
  Filled 2016-05-25: qty 22

## 2016-05-25 MED ORDER — DIPHENHYDRAMINE HCL 25 MG PO CAPS
ORAL_CAPSULE | ORAL | Status: AC
  Filled 2016-05-25: qty 2

## 2016-05-25 MED ORDER — SODIUM CHLORIDE 0.9 % IV SOLN
Freq: Once | INTRAVENOUS | Status: AC
  Administered 2016-05-25: 10:00:00 via INTRAVENOUS

## 2016-05-25 MED ORDER — SODIUM CHLORIDE 0.9% FLUSH
10.0000 mL | INTRAVENOUS | Status: DC | PRN
  Administered 2016-05-25: 10 mL
  Filled 2016-05-25: qty 10

## 2016-05-25 MED ORDER — DIPHENHYDRAMINE HCL 25 MG PO CAPS
50.0000 mg | ORAL_CAPSULE | Freq: Once | ORAL | Status: AC
  Administered 2016-05-25: 50 mg via ORAL

## 2016-05-25 MED ORDER — HEPARIN SOD (PORK) LOCK FLUSH 100 UNIT/ML IV SOLN
500.0000 [IU] | Freq: Once | INTRAVENOUS | Status: AC | PRN
  Administered 2016-05-25: 500 [IU]
  Filled 2016-05-25: qty 5

## 2016-05-25 MED ORDER — ZOLEDRONIC ACID 4 MG/100ML IV SOLN
4.0000 mg | Freq: Once | INTRAVENOUS | Status: AC
  Administered 2016-05-25: 4 mg via INTRAVENOUS
  Filled 2016-05-25: qty 100

## 2016-05-25 MED ORDER — ACETAMINOPHEN 325 MG PO TABS
ORAL_TABLET | ORAL | Status: AC
  Filled 2016-05-25: qty 2

## 2016-05-25 MED ORDER — ACETAMINOPHEN 325 MG PO TABS
650.0000 mg | ORAL_TABLET | Freq: Once | ORAL | Status: AC
  Administered 2016-05-25: 650 mg via ORAL

## 2016-05-25 NOTE — Progress Notes (Signed)
Patient Care Team: [name], MD as PCP - General (Family Medicine)  DIAGNOSIS: Breast cancer of upper-outer quadrant of left female breast [facility])   Staging form: Breast, AJCC 7th Edition   - Clinical: Stage IIB (T2, N1, cM0) - Unsigned   - Pathologic: No stage assigned - Unsigned  SUMMARY OF ONCOLOGIC HISTORY:   Breast cancer of upper-outer quadrant of left female breast ([place])   11/14/2011 Surgery    Bilateral mastectomy, prophylactic on the right, left breast IDC 3/18 lymph nodes positive with extracapsular extension ER 89%, PR 81%, HER-2 negative, Ki-67 79% T2 N1 A. stage IIB     12/13/2011 - 06/28/2012 Chemotherapy    4 cycles of FEC followed by 4 cycles of Taxotere     07/17/2012 - 08/22/2012 Radiation Therapy    Adjuvant radiation therapy     08/22/2012 - 03/16/2016 Anti-estrogen oral therapy    Arimidex 1 mg daily     03/16/2016 Relapse/Recurrence    Subcutaneous nodule excision left chest: Infiltrating carcinoma breast primary, ER positive, PR negative     03/29/2016 Imaging    CT CAP and bone scan: Lytic lesions T8 vertebral, T1 posterior element, subcutaneous nodule left lateral chest wall, nonspecific lung nodules; Bone scan: Mets to kull, left humerus, left eighth rib, T7/T8, sternum, left acetabulum     04/28/2016 -  Chemotherapy    Herceptin, lapatinib, Faslodex, Zometa every 4 weeks      CHIEF COMPLIANT: Follow-up on Herceptin, lapatinib and Zometa and Faslodex  INTERVAL HISTORY: [name] Riley is a 65 year old with above-mentioned history of metastatic breast cancer currently on Herceptin and lapatinib Faslodex and Zometa. She had tolerated first month of treatment reasonably well, except for diarrhea. She still has diarrhea even though she ran out of lapatinib and the new supply of lapatinib and has not come in yet. He denies any muscle aches or hot flashes. Denies any bone pain. Diarrhea is quite intermittent and it may come on for day and not for the next 2 or 3  days and then admitted come back again. It appears to be quite urgent when she does get the diarrhea.  REVIEW OF SYSTEMS:   Constitutional: Denies fevers, chills or abnormal weight loss Eyes: Denies blurriness of vision Ears, nose, mouth, throat, and face: Denies mucositis or sore throat Respiratory: Denies cough, dyspnea or wheezes Cardiovascular: Denies palpitation, chest discomfort Gastrointestinal:  Diarrhea Skin: Denies abnormal skin rashes Lymphatics: Denies new lymphadenopathy or easy bruising Neurological:Denies numbness, tingling or new weaknesses Behavioral/Psych: Mood is stable, no new changes  Extremities: No lower extremity edema All other systems were reviewed with the patient and are negative.  I have reviewed the past medical history, past surgical history, social history and family history with the patient and they are unchanged from previous note.  ALLERGIES:  is allergic to contrast media [iodinated diagnostic agents] and food.  MEDICATIONS:  Current Outpatient Prescriptions  Medication Sig Dispense Refill  . aspirin 81 MG tablet Take 81 mg by mouth daily.      . Cholecalciferol (VITAMIN D-3 PO) Take 2,000 Int'l Units by mouth daily.    . lapatinib (TYKERB) 250 MG tablet Take 5 tablets (1,250 mg total) by mouth daily. 150 tablet 3  . levothyroxine (SYNTHROID, LEVOTHROID) 75 MCG tablet Take 75 mcg by mouth daily.     Marland Kitchen lidocaine-prilocaine (EMLA) cream Apply to affected area once 30 g 3  . metoprolol succinate (TOPROL-XL) 100 MG 24 hr tablet Take 1 tablet (100 mg total)  by mouth daily. 90 tablet 3  . Multiple Vitamins-Minerals (MULTIVITAMIN WITH MINERALS) tablet Take 1 tablet by mouth daily.       No current facility-administered medications for this visit.    Facility-Administered Medications Ordered in Other Visits  Medication Dose Route Frequency Provider Last Rate Last Dose  . 0.9 %  sodium chloride infusion   Intravenous Once Nicholas Lose, MD      . heparin  lock flush 100 unit/mL  500 Units Intracatheter Once PRN Nicholas Lose, MD      . sodium chloride flush (NS) 0.9 % injection 10 mL  10 mL Intracatheter PRN Nicholas Lose, MD        PHYSICAL EXAMINATION: ECOG PERFORMANCE STATUS: 1 - Symptomatic but completely ambulatory  Vitals:   05/25/16 0820  BP: (!) 160/90  Pulse: 82  Resp: 18  Temp: 99 F (37.2 C)   Filed Weights   05/25/16 0820  Weight: 165 lb 12.8 oz (75.2 kg)    GENERAL:alert, no distress and comfortable SKIN: skin color, texture, turgor are normal, no rashes or significant lesions EYES: normal, Conjunctiva are pink and non-injected, sclera clear OROPHARYNX:no exudate, no erythema and lips, buccal mucosa, and tongue normal  NECK: supple, thyroid normal size, non-tender, without nodularity LYMPH:  no palpable lymphadenopathy in the cervical, axillary or inguinal LUNGS: clear to auscultation and percussion with normal breathing effort HEART: regular rate & rhythm and no murmurs and no lower extremity edema ABDOMEN:abdomen soft, non-tender and normal bowel sounds MUSCULOSKELETAL:no cyanosis of digits and no clubbing  NEURO: alert & oriented x 3 with fluent speech, no focal motor/sensory deficits EXTREMITIES: No lower extremity edema  LABORATORY DATA:  I have reviewed the data as listed   Chemistry      Component Value Date/Time   NA 142 05/25/2016 0840   K 3.3 (L) 05/25/2016 0840   CL 106 10/23/2014 1156   CL 105 08/10/2012 0908   CO2 26 05/25/2016 0840   BUN 10.3 05/25/2016 0840   CREATININE 0.7 05/25/2016 0840      Component Value Date/Time   CALCIUM 9.3 05/25/2016 0840   ALKPHOS 259 (H) 05/25/2016 0840   AST 30 05/25/2016 0840   ALT 29 05/25/2016 0840   BILITOT 0.51 05/25/2016 0840       Lab Results  Component Value Date   WBC 5.9 05/25/2016   HGB 12.5 05/25/2016   HCT 37.4 05/25/2016   MCV 92.8 05/25/2016   PLT 293 05/25/2016   NEUTROABS 2.5 05/25/2016   ASSESSMENT & PLAN:  Breast cancer of  upper-outer quadrant of left female breast ([place]) Left breast invasive ductal carcinoma T2, N1, 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 Was on Arimidex since 08/22/2012 to 03/30/16  Subcutaneous nodule excision left chest: Infiltrating carcinoma breast primary, ER positive, PR negative, HER-2 positive  CT CAP and bone scan 03/29/2016: Lytic lesions T8 vertebral, T1 posterior element, subcutaneous nodule left lateral chest wall, nonspecific lung nodules; Bone scan: Mets to kull, left humerus, left eighth rib, T7/T8, sternum, left acetabulum. (In retrospect, original breast MRI in 2002 revealed sternal lesions concerning for metastatic disease, PET CT scan did not reveal metastases)  Goals of treatment: Palliation and prolongation of life. Patient understands that stage IV breast cancer cannot be cured but it can be managed with fairly long survivals.  Treatment Plan: Herceptin  Q 4 weeks, Lapatinib, Faslodex, Zometa, started 04/28/2016, today is cycle 2  Toxicities: 1. Diarrhea from lapatinib 2. Injection site discomfort from Faslodex  Our goal is to do CT scan and bone scan after 3 months of therapy.  RTC in 4 weeks   Orders Placed This Encounter  Procedures  . Comprehensive metabolic panel    Standing Status:   Future    Number of Occurrences:   1    Standing Expiration Date:   05/25/2017   The patient has a good understanding of the overall plan. she agrees with it. she will call with any problems that may develop before the next visit here.   Rulon Eisenmenger, MD 05/25/16

## 2016-05-25 NOTE — Progress Notes (Signed)
This injection is to be given in Infusion room

## 2016-05-25 NOTE — Patient Instructions (Signed)
[facility] Discharge Instructions for Patients Receiving Chemotherapy  Today you received the following chemotherapy agents: Herceptin  To help prevent nausea and vomiting after your treatment, we encourage you to take your nausea medication as directed. If you develop nausea and vomiting that is not controlled by your nausea medication, call the clinic.   BELOW ARE SYMPTOMS THAT SHOULD BE REPORTED IMMEDIATELY:  *FEVER GREATER THAN 100.5 F  *CHILLS WITH OR WITHOUT FEVER  NAUSEA AND VOMITING THAT IS NOT CONTROLLED WITH YOUR NAUSEA MEDICATION  *UNUSUAL SHORTNESS OF BREATH  *UNUSUAL BRUISING OR BLEEDING  TENDERNESS IN MOUTH AND THROAT WITH OR WITHOUT PRESENCE OF ULCERS  *URINARY PROBLEMS  *BOWEL PROBLEMS  UNUSUAL RASH Items with * indicate a potential emergency and should be followed up as soon as possible.  Feel free to call the clinic you have any questions or concerns. The clinic phone number is [phone].  Please show the [place] at check-in to the Emergency Department and triage nurse.  Hypokalemia Hypokalemia means that the amount of potassium in the blood is lower than normal.Potassium is a chemical, called an electrolyte, that helps regulate the amount of fluid in the body. It also stimulates muscle contraction and helps nerves function properly.Most of the body's potassium is inside of cells, and only a very small amount is in the blood. Because the amount in the blood is so small, minor changes can be life-threatening. CAUSES  Antibiotics.  Diarrhea or vomiting.  Using laxatives too much, which can cause diarrhea.  Chronic kidney disease.  Water pills (diuretics).  Eating disorders (bulimia).  Low magnesium level.  Sweating a lot. SIGNS AND SYMPTOMS  Weakness.  Constipation.  Fatigue.  Muscle cramps.  Mental confusion.  Skipped heartbeats or irregular heartbeat (palpitations).  Tingling or numbness. DIAGNOSIS   Your health care provider can diagnose hypokalemia with blood tests. In addition to checking your potassium level, your health care provider may also check other lab tests. TREATMENT Hypokalemia can be treated with potassium supplements taken by mouth or adjustments in your current medicines. If your potassium level is very low, you may need to get potassium through a vein (IV) and be monitored in the hospital. A diet high in potassium is also helpful. Foods high in potassium are:  Nuts, such as peanuts and pistachios.  Seeds, such as sunflower seeds and pumpkin seeds.  Peas, lentils, and lima beans.  Whole grain and bran cereals and breads.  Fresh fruit and vegetables, such as apricots, avocado, bananas, cantaloupe, kiwi, oranges, tomatoes, asparagus, and potatoes.  Orange and tomato juices.  Red meats.  Fruit yogurt. HOME CARE INSTRUCTIONS  Take all medicines as prescribed by your health care provider.  Maintain a healthy diet by including nutritious food, such as fruits, vegetables, nuts, whole grains, and lean meats.  If you are taking a laxative, be sure to follow the directions on the label. SEEK MEDICAL CARE IF:  Your weakness gets worse.  You feel your heart pounding or racing.  You are vomiting or having diarrhea.  You are diabetic and having trouble keeping your blood glucose in the normal range. SEEK IMMEDIATE MEDICAL CARE IF:  You have chest pain, shortness of breath, or dizziness.  You are vomiting or having diarrhea for more than 2 days.  You faint. MAKE SURE YOU:   Understand these instructions.  Will watch your condition.  Will get help right away if you are not doing well or get worse.   This information  is not intended to replace advice given to you by your health care provider. Make sure you discuss any questions you have with your health care provider.   Document Released: 10/03/2005 Document Revised: 10/24/2014 Document Reviewed:  04/05/2013 Elsevier Interactive Patient Education [employer].

## 2016-05-25 NOTE — Telephone Encounter (Signed)
appt made and avs printed °

## 2016-06-01 ENCOUNTER — Other Ambulatory Visit: Payer: Self-pay | Admitting: Hematology and Oncology

## 2016-06-01 DIAGNOSIS — C7951 Secondary malignant neoplasm of bone: Secondary | ICD-10-CM | POA: Insufficient documentation

## 2016-06-01 DIAGNOSIS — C50919 Malignant neoplasm of unspecified site of unspecified female breast: Secondary | ICD-10-CM | POA: Insufficient documentation

## 2016-06-22 ENCOUNTER — Ambulatory Visit (HOSPITAL_BASED_OUTPATIENT_CLINIC_OR_DEPARTMENT_OTHER): Payer: Managed Care, Other (non HMO) | Admitting: Hematology and Oncology

## 2016-06-22 ENCOUNTER — Encounter: Payer: Self-pay | Admitting: Hematology and Oncology

## 2016-06-22 ENCOUNTER — Other Ambulatory Visit: Payer: Self-pay

## 2016-06-22 ENCOUNTER — Ambulatory Visit: Payer: Managed Care, Other (non HMO)

## 2016-06-22 ENCOUNTER — Ambulatory Visit (HOSPITAL_BASED_OUTPATIENT_CLINIC_OR_DEPARTMENT_OTHER): Payer: Managed Care, Other (non HMO)

## 2016-06-22 ENCOUNTER — Other Ambulatory Visit (HOSPITAL_BASED_OUTPATIENT_CLINIC_OR_DEPARTMENT_OTHER): Payer: Managed Care, Other (non HMO)

## 2016-06-22 VITALS — BP 147/88 | HR 71

## 2016-06-22 DIAGNOSIS — C50412 Malignant neoplasm of upper-outer quadrant of left female breast: Secondary | ICD-10-CM

## 2016-06-22 DIAGNOSIS — C7951 Secondary malignant neoplasm of bone: Secondary | ICD-10-CM

## 2016-06-22 DIAGNOSIS — C50812 Malignant neoplasm of overlapping sites of left female breast: Secondary | ICD-10-CM

## 2016-06-22 DIAGNOSIS — Z5112 Encounter for antineoplastic immunotherapy: Secondary | ICD-10-CM

## 2016-06-22 LAB — COMPREHENSIVE METABOLIC PANEL
ALT: 26 U/L (ref 0–55)
AST: 27 U/L (ref 5–34)
Albumin: 4 g/dL (ref 3.5–5.0)
Alkaline Phosphatase: 157 U/L — ABNORMAL HIGH (ref 40–150)
Anion Gap: 12 mEq/L — ABNORMAL HIGH (ref 3–11)
BUN: 9.9 mg/dL (ref 7.0–26.0)
CO2: 26 mEq/L (ref 22–29)
Calcium: 9.4 mg/dL (ref 8.4–10.4)
Chloride: 106 mEq/L (ref 98–109)
Creatinine: 0.7 mg/dL (ref 0.6–1.1)
EGFR: >90 mL/min/{1.73_m2} (ref >90)
Glucose: 97 mg/dl (ref 70–140)
Potassium: 3.4 mEq/L — ABNORMAL LOW (ref 3.5–5.1)
Sodium: 144 mEq/L (ref 136–145)
Total Bilirubin: 0.56 mg/dL (ref 0.20–1.20)
Total Protein: 7.5 g/dL (ref 6.4–8.3)

## 2016-06-22 LAB — CBC WITH DIFFERENTIAL/PLATELET
BASO%: 0.7 % (ref 0.0–2.0)
Basophils Absolute: 0 10*3/uL (ref 0.0–0.1)
EOS%: 4.4 % (ref 0.0–7.0)
Eosinophils Absolute: 0.2 10*3/uL (ref 0.0–0.5)
HCT: 38.6 % (ref 34.8–46.6)
HGB: 12.8 g/dL (ref 11.6–15.9)
LYMPH%: 38.9 % (ref 14.0–49.7)
MCH: 30.8 pg (ref 25.1–34.0)
MCHC: 33 g/dL (ref 31.5–36.0)
MCV: 93.1 fL (ref 79.5–101.0)
MONO#: 0.4 10*3/uL (ref 0.1–0.9)
MONO%: 9.4 % (ref 0.0–14.0)
NEUT#: 2.2 10*3/uL (ref 1.5–6.5)
NEUT%: 46.6 % (ref 38.4–76.8)
Platelets: 270 10*3/uL (ref 145–400)
RBC: 4.15 10*6/uL (ref 3.70–5.45)
RDW: 15 % — ABNORMAL HIGH (ref 11.2–14.5)
WBC: 4.8 10*3/uL (ref 3.9–10.3)
lymph#: 1.9 10*3/uL (ref 0.9–3.3)

## 2016-06-22 MED ORDER — FULVESTRANT 250 MG/5ML IM SOLN
500.0000 mg | INTRAMUSCULAR | Status: DC
  Administered 2016-06-22: 500 mg via INTRAMUSCULAR
  Filled 2016-06-22: qty 10

## 2016-06-22 MED ORDER — HEPARIN SOD (PORK) LOCK FLUSH 100 UNIT/ML IV SOLN
500.0000 [IU] | Freq: Once | INTRAVENOUS | Status: AC | PRN
  Administered 2016-06-22: 500 [IU]
  Filled 2016-06-22: qty 5

## 2016-06-22 MED ORDER — TRASTUZUMAB CHEMO 150 MG IV SOLR
6.0000 mg/kg | Freq: Once | INTRAVENOUS | Status: AC
  Administered 2016-06-22: 462 mg via INTRAVENOUS
  Filled 2016-06-22: qty 22

## 2016-06-22 MED ORDER — DIPHENHYDRAMINE HCL 25 MG PO CAPS
ORAL_CAPSULE | ORAL | Status: AC
  Filled 2016-06-22: qty 2

## 2016-06-22 MED ORDER — DIPHENHYDRAMINE HCL 25 MG PO CAPS
50.0000 mg | ORAL_CAPSULE | Freq: Once | ORAL | Status: AC
  Administered 2016-06-22: 50 mg via ORAL

## 2016-06-22 MED ORDER — SODIUM CHLORIDE 0.9% FLUSH
10.0000 mL | INTRAVENOUS | Status: DC | PRN
Start: 2016-06-22 — End: 2016-06-22
  Administered 2016-06-22: 10 mL
  Filled 2016-06-22: qty 10

## 2016-06-22 MED ORDER — ACETAMINOPHEN 325 MG PO TABS
ORAL_TABLET | ORAL | Status: AC
  Filled 2016-06-22: qty 2

## 2016-06-22 MED ORDER — ACETAMINOPHEN 325 MG PO TABS
650.0000 mg | ORAL_TABLET | Freq: Once | ORAL | Status: AC
  Administered 2016-06-22: 650 mg via ORAL

## 2016-06-22 MED ORDER — ZOLEDRONIC ACID 4 MG/100ML IV SOLN
4.0000 mg | Freq: Once | INTRAVENOUS | Status: AC
  Administered 2016-06-22: 4 mg via INTRAVENOUS
  Filled 2016-06-22: qty 100

## 2016-06-22 MED ORDER — SODIUM CHLORIDE 0.9 % IV SOLN
Freq: Once | INTRAVENOUS | Status: AC
  Administered 2016-06-22: 10:00:00 via INTRAVENOUS

## 2016-06-22 NOTE — Progress Notes (Signed)
Per Dr. Lindi Adie Pt to receive Faslodex, Zometa and Herceptin today and monthly.

## 2016-06-22 NOTE — Progress Notes (Signed)
Faslodex Injection to be given in Infusion Area

## 2016-06-22 NOTE — Progress Notes (Signed)
Patient Care Team: [name], MD as PCP - General (Family Medicine)  DIAGNOSIS: Breast cancer of upper-outer quadrant of left female breast [facility])   Staging form: Breast, AJCC 7th Edition   - Clinical: Stage IIB (T2, N1, cM0) - Unsigned   - Pathologic: No stage assigned - Unsigned  SUMMARY OF ONCOLOGIC HISTORY:   Breast cancer of upper-outer quadrant of left female breast ([place])   11/14/2011 Surgery    Bilateral mastectomy, prophylactic on the right, left breast IDC 3/18 lymph nodes positive with extracapsular extension ER 89%, PR 81%, HER-2 negative, Ki-67 79% T2 N1 A. stage IIB      12/13/2011 - 06/28/2012 Chemotherapy    4 cycles of FEC followed by 4 cycles of Taxotere      07/17/2012 - 08/22/2012 Radiation Therapy    Adjuvant radiation therapy      08/22/2012 - 03/16/2016 Anti-estrogen oral therapy    Arimidex 1 mg daily      03/16/2016 Relapse/Recurrence    Subcutaneous nodule excision left chest: Infiltrating carcinoma breast primary, ER positive, PR negative      03/29/2016 Imaging    CT CAP and bone scan: Lytic lesions T8 vertebral, T1 posterior element, subcutaneous nodule left lateral chest wall, nonspecific lung nodules; Bone scan: Mets to kull, left humerus, left eighth rib, T7/T8, sternum, left acetabulum      04/28/2016 -  Chemotherapy    Herceptin, lapatinib, Faslodex, Zometa every 4 weeks       CHIEF COMPLIANT: follow-up on Herceptin, lapatinib, Faslodex and Zometa  INTERVAL HISTORY: [name] Riley is a 65 year old with above-mentioned history of metastatic breast cancer that is HER-2 and estrogen receptor positive. She is currently on Herceptin and lapatinib along with Faslodex and Zometa. She is tolerating the treatment fairly well. She does have intermittent diarrhea. She is adjusting her lifestyle according to her diarrhea. Denies any nausea vomiting. Energy levels are excellent.   REVIEW OF SYSTEMS:   Constitutional: Denies fevers, chills or abnormal  weight loss Eyes: Denies blurriness of vision Ears, nose, mouth, throat, and face: Denies mucositis or sore throat Respiratory: Denies cough, dyspnea or wheezes Cardiovascular: Denies palpitation, chest discomfort Gastrointestinal:  Denies nausea, heartburn or change in bowel habits Skin: Denies abnormal skin rashes Lymphatics: Denies new lymphadenopathy or easy bruising Neurological:Denies numbness, tingling or new weaknesses Behavioral/Psych: Mood is stable, no new changes  Extremities: No lower extremity edema  All other systems were reviewed with the patient and are negative.  I have reviewed the past medical history, past surgical history, social history and family history with the patient and they are unchanged from previous note.  ALLERGIES:  is allergic to contrast media [iodinated diagnostic agents] and food.  MEDICATIONS:  Current Outpatient Prescriptions  Medication Sig Dispense Refill  . aspirin 81 MG tablet Take 81 mg by mouth daily.      . Cholecalciferol (VITAMIN D-3 PO) Take 2,000 Int'l Units by mouth daily.    . lapatinib (TYKERB) 250 MG tablet Take 5 tablets (1,250 mg total) by mouth daily. 150 tablet 3  . levothyroxine (SYNTHROID, LEVOTHROID) 75 MCG tablet Take 75 mcg by mouth daily.     Marland Kitchen lidocaine-prilocaine (EMLA) cream Apply to affected area once 30 g 3  . metoprolol succinate (TOPROL-XL) 100 MG 24 hr tablet Take 1 tablet (100 mg total) by mouth daily. 90 tablet 3  . Multiple Vitamins-Minerals (MULTIVITAMIN WITH MINERALS) tablet Take 1 tablet by mouth daily.       No current facility-administered medications  for this visit.     PHYSICAL EXAMINATION: ECOG PERFORMANCE STATUS: 1 - Symptomatic but completely ambulatory  Vitals:   06/22/16 0838  BP: (!) 169/99  Pulse: 78  Resp: 18  Temp: 97.8 F (36.6 C)   Filed Weights   06/22/16 0838  Weight: 164 lb (74.4 kg)    GENERAL:alert, no distress and comfortable SKIN: skin color, texture, turgor are  normal, no rashes or significant lesions EYES: normal, Conjunctiva are pink and non-injected, sclera clear OROPHARYNX:no exudate, no erythema and lips, buccal mucosa, and tongue normal  NECK: supple, thyroid normal size, non-tender, without nodularity LYMPH:  no palpable lymphadenopathy in the cervical, axillary or inguinal LUNGS: clear to auscultation and percussion with normal breathing effort HEART: regular rate & rhythm and no murmurs and no lower extremity edema ABDOMEN:abdomen soft, non-tender and normal bowel sounds MUSCULOSKELETAL:no cyanosis of digits and no clubbing  NEURO: alert & oriented x 3 with fluent speech, no focal motor/sensory deficits EXTREMITIES: No lower extremity edema  LABORATORY DATA:  I have reviewed the data as listed   Chemistry      Component Value Date/Time   NA 142 05/25/2016 0840   K 3.3 (L) 05/25/2016 0840   CL 106 10/23/2014 1156   CL 105 08/10/2012 0908   CO2 26 05/25/2016 0840   BUN 10.3 05/25/2016 0840   CREATININE 0.7 05/25/2016 0840      Component Value Date/Time   CALCIUM 9.3 05/25/2016 0840   ALKPHOS 259 (H) 05/25/2016 0840   AST 30 05/25/2016 0840   ALT 29 05/25/2016 0840   BILITOT 0.51 05/25/2016 0840       Lab Results  Component Value Date   WBC 5.9 05/25/2016   HGB 12.5 05/25/2016   HCT 37.4 05/25/2016   MCV 92.8 05/25/2016   PLT 293 05/25/2016   NEUTROABS 2.5 05/25/2016     ASSESSMENT & PLAN:  Breast cancer of upper-outer quadrant of left female breast ([facility]) Left breast invasive ductal carcinomaT2, N1, 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 Was on Arimidex since 08/22/2012 to 03/30/16  Subcutaneous nodule excisionleft chest: Infiltrating carcinoma breast primary, ER positive, PR negative, HER-2 positive  CT CAP and bone scan06/13/2017: Lytic lesions T8 vertebral, T1  posterior element, subcutaneous nodule left lateral chest wall, nonspecific lung nodules; Bone scan: Mets to kull, left humerus, left eighth rib, T7/T8, sternum, left acetabulum. (In retrospect, original breast MRI in 2002 revealed sternal lesions concerning for metastatic disease, PET CT scan did not reveal metastases)  Goals of treatment: Palliation and prolongation of life. Patient understands that stage IV breast cancer cannot be cured but it can be managed with fairly long survivals.  Treatment Plan: Herceptin Q 4 weeks, Lapatinib, Faslodex, Zometa, started 04/28/2016, today is cycle 3  Toxicities: 1. Diarrhea from lapatinib 2. Injection site discomfort from Faslodex  Our goal is to do CT scan and bone scan at [facility] and echocardiogram in [place] in one month and follow-up after.  RTC in 4 weeks   No orders of the defined types were placed in this encounter.  The patient has a good understanding of the overall plan. she agrees with it. she will call with any problems that may develop before the next visit here.   Rulon Eisenmenger, MD 06/22/16

## 2016-06-22 NOTE — Assessment & Plan Note (Signed)
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 Was on Arimidex since 08/22/2012 to 03/30/16  Subcutaneous nodule excisionleft chest: Infiltrating carcinoma breast primary, ER positive, PR negative, HER-2 positive  CT CAP and bone scan06/13/2017: Lytic lesions T8 vertebral, T1 posterior element, subcutaneous nodule left lateral chest wall, nonspecific lung nodules; Bone scan: Mets to kull, left humerus, left eighth rib, T7/T8, sternum, left acetabulum. (In retrospect, original breast MRI in 2002 revealed sternal lesions concerning for metastatic disease, PET CT scan did not reveal metastases)  Goals of treatment: Palliation and prolongation of life. Patient understands that stage IV breast cancer cannot be cured but it can be managed with fairly long survivals.  Treatment Plan: Herceptin Q 4 weeks, Lapatinib, Faslodex, Zometa, started 04/28/2016, today is cycle 3  Toxicities: 1. Diarrhea from lapatinib 2. Injection site discomfort from Faslodex  Our goal is to do CT scan and bone scan in one month in follow-up after.  RTC in 4 weeks

## 2016-06-22 NOTE — Patient Instructions (Addendum)
[facility] Discharge Instructions for Patients Receiving Chemotherapy  Today you received the following chemotherapy agents: Herceptin, Zometa, and Faslodex  To help prevent nausea and vomiting after your treatment, we encourage you to take your nausea medication as directed. If you develop nausea and vomiting that is not controlled by your nausea medication, call the clinic.   BELOW ARE SYMPTOMS THAT SHOULD BE REPORTED IMMEDIATELY:  *FEVER GREATER THAN 100.5 F  *CHILLS WITH OR WITHOUT FEVER  NAUSEA AND VOMITING THAT IS NOT CONTROLLED WITH YOUR NAUSEA MEDICATION  *UNUSUAL SHORTNESS OF BREATH  *UNUSUAL BRUISING OR BLEEDING  TENDERNESS IN MOUTH AND THROAT WITH OR WITHOUT PRESENCE OF ULCERS  *URINARY PROBLEMS  *BOWEL PROBLEMS  UNUSUAL RASH Items with * indicate a potential emergency and should be followed up as soon as possible.  Feel free to call the clinic you have any questions or concerns. The clinic phone number is [phone].  Please show the [place] at check-in to the Emergency Department and triage nurse.  Hypokalemia Hypokalemia means that the amount of potassium in the blood is lower than normal.Potassium is a chemical, called an electrolyte, that helps regulate the amount of fluid in the body. It also stimulates muscle contraction and helps nerves function properly.Most of the body's potassium is inside of cells, and only a very small amount is in the blood. Because the amount in the blood is so small, minor changes can be life-threatening. CAUSES  Antibiotics.  Diarrhea or vomiting.  Using laxatives too much, which can cause diarrhea.  Chronic kidney disease.  Water pills (diuretics).  Eating disorders (bulimia).  Low magnesium level.  Sweating a lot. SIGNS AND SYMPTOMS  Weakness.  Constipation.  Fatigue.  Muscle cramps.  Mental confusion.  Skipped heartbeats or irregular heartbeat (palpitations).  Tingling or  numbness. DIAGNOSIS  Your health care provider can diagnose hypokalemia with blood tests. In addition to checking your potassium level, your health care provider may also check other lab tests. TREATMENT Hypokalemia can be treated with potassium supplements taken by mouth or adjustments in your current medicines. If your potassium level is very low, you may need to get potassium through a vein (IV) and be monitored in the hospital. A diet high in potassium is also helpful. Foods high in potassium are:  Nuts, such as peanuts and pistachios.  Seeds, such as sunflower seeds and pumpkin seeds.  Peas, lentils, and lima beans.  Whole grain and bran cereals and breads.  Fresh fruit and vegetables, such as apricots, avocado, bananas, cantaloupe, kiwi, oranges, tomatoes, asparagus, and potatoes.  Orange and tomato juices.  Red meats.  Fruit yogurt. HOME CARE INSTRUCTIONS  Take all medicines as prescribed by your health care provider.  Maintain a healthy diet by including nutritious food, such as fruits, vegetables, nuts, whole grains, and lean meats.  If you are taking a laxative, be sure to follow the directions on the label. SEEK MEDICAL CARE IF:  Your weakness gets worse.  You feel your heart pounding or racing.  You are vomiting or having diarrhea.  You are diabetic and having trouble keeping your blood glucose in the normal range. SEEK IMMEDIATE MEDICAL CARE IF:  You have chest pain, shortness of breath, or dizziness.  You are vomiting or having diarrhea for more than 2 days.  You faint. MAKE SURE YOU:   Understand these instructions.  Will watch your condition.  Will get help right away if you are not doing well or get worse.  This information is not intended to replace advice given to you by your health care provider. Make sure you discuss any questions you have with your health care provider.   Document Released: 10/03/2005 Document Revised: 10/24/2014 Document  Reviewed: 04/05/2013 Elsevier Interactive Patient Education [employer].

## 2016-07-03 ENCOUNTER — Encounter (HOSPITAL_COMMUNITY): Payer: Self-pay | Admitting: Emergency Medicine

## 2016-07-03 ENCOUNTER — Emergency Department (HOSPITAL_COMMUNITY)
Admission: EM | Admit: 2016-07-03 | Discharge: 2016-07-03 | Disposition: A | Discharge status: Home / Self Care | Payer: Managed Care, Other (non HMO) | Attending: Emergency Medicine | Admitting: Emergency Medicine

## 2016-07-03 DIAGNOSIS — I2510 Atherosclerotic heart disease of native coronary artery without angina pectoris: Secondary | ICD-10-CM | POA: Diagnosis not present

## 2016-07-03 DIAGNOSIS — Z853 Personal history of malignant neoplasm of breast: Secondary | ICD-10-CM | POA: Diagnosis not present

## 2016-07-03 DIAGNOSIS — L02211 Cutaneous abscess of abdominal wall: Secondary | ICD-10-CM | POA: Insufficient documentation

## 2016-07-03 DIAGNOSIS — Z79899 Other long term (current) drug therapy: Secondary | ICD-10-CM | POA: Insufficient documentation

## 2016-07-03 DIAGNOSIS — I10 Essential (primary) hypertension: Secondary | ICD-10-CM | POA: Diagnosis not present

## 2016-07-03 DIAGNOSIS — E039 Hypothyroidism, unspecified: Secondary | ICD-10-CM | POA: Diagnosis not present

## 2016-07-03 DIAGNOSIS — L089 Local infection of the skin and subcutaneous tissue, unspecified: Secondary | ICD-10-CM

## 2016-07-03 DIAGNOSIS — Z7982 Long term (current) use of aspirin: Secondary | ICD-10-CM | POA: Diagnosis not present

## 2016-07-03 DIAGNOSIS — L0291 Cutaneous abscess, unspecified: Secondary | ICD-10-CM

## 2016-07-03 LAB — CBC WITH DIFFERENTIAL/PLATELET
Basophils Absolute: 0 10*3/uL (ref 0.0–0.1)
Basophils Relative: 0 %
Eosinophils Absolute: 0 10*3/uL (ref 0.0–0.7)
Eosinophils Relative: 0 %
HCT: 38.1 % (ref 36.0–46.0)
Hemoglobin: 12.5 g/dL (ref 12.0–15.0)
Lymphocytes Relative: 15 %
Lymphs Abs: 1.4 10*3/uL (ref 0.7–4.0)
MCH: 30.7 pg (ref 26.0–34.0)
MCHC: 32.8 g/dL (ref 30.0–36.0)
MCV: 93.6 fL (ref 78.0–100.0)
Monocytes Absolute: 0.8 10*3/uL (ref 0.1–1.0)
Monocytes Relative: 9 %
Neutro Abs: 7.2 10*3/uL (ref 1.7–7.7)
Neutrophils Relative %: 76 %
Platelets: 278 10*3/uL (ref 150–400)
RBC: 4.07 MIL/uL (ref 3.87–5.11)
RDW: 13.7 % (ref 11.5–15.5)
WBC: 9.5 10*3/uL (ref 4.0–10.5)

## 2016-07-03 LAB — BASIC METABOLIC PANEL
Anion gap: 7 (ref 5–15)
BUN: 11 mg/dL (ref 6–20)
CO2: 26 mmol/L (ref 22–32)
Calcium: 9.2 mg/dL (ref 8.9–10.3)
Chloride: 106 mmol/L (ref 101–111)
Creatinine, Ser: 0.54 mg/dL (ref 0.44–1.00)
GFR calc Af Amer: >60 mL/min (ref >60)
GFR calc non Af Amer: >60 mL/min (ref >60)
Glucose, Bld: 110 mg/dL — ABNORMAL HIGH (ref 65–99)
Potassium: 3.4 mmol/L — ABNORMAL LOW (ref 3.5–5.1)
Sodium: 139 mmol/L (ref 135–145)

## 2016-07-03 MED ORDER — LIDOCAINE-EPINEPHRINE (PF) 1 %-1:200000 IJ SOLN
INTRAMUSCULAR | Status: AC
  Administered 2016-07-03: 11:00:00
  Filled 2016-07-03: qty 30

## 2016-07-03 MED ORDER — DOXYCYCLINE HYCLATE 100 MG PO CAPS: 100.0000 mg | ORAL_CAPSULE | Freq: Two times a day (BID) | ORAL | 0 refills | Status: DC

## 2016-07-03 NOTE — ED Triage Notes (Signed)
Pt with abscess on abdomen and discolored / inflamed / painful R 4th toe due to possible insect bites. Pt being treated for breast cancer and states a hx of insect bites becoming infected. Abdominal abscess draining since this morning. Red and painful.

## 2016-07-03 NOTE — ED Provider Notes (Signed)
[facility] DEPT Provider Note   CSN: [id number] Arrival date & time: 07/03/16  0805     History   Chief Complaint Chief Complaint  Patient presents with  . Abscess    abdomen    HPI [name] Riley is a 65 y.o. female.  HPI Patient presents to emergency department with chief complaint of increasing pain and tenderness to an area on her abdominal wall which she started draining purulent material this morning.  She also has an area on her toe.  The one on the toes not draining.  She is currently under chemotherapy therapy for breast cancer.  Denies fever or chills.  Denies vomiting or diarrhea. Past Medical History:  Diagnosis Date  . Allergy   . Breast cancer ([place]) 08/25/2011   L , invasive ductal carcinoma, ER/PR +,HER2 -  . Cancer [facility])    left breast cancer  . Coronary artery disease 2001  . Heart attack [facility]) 09/2000   Sep 25, 2000  --no intervention  . History of chemotherapy comp. 08/22/2012   4 cycles of FEC and $ cycles of Taxotere  . Hyperlipidemia   . Hypertension   . Hypothyroidism   . PONV (postoperative nausea and vomiting)    gets sick from anesthesia  . Status post radiation therapy 07/09/12 - 08/22/2012   Left Breast, 60.4 gray    Patient Active Problem List   Diagnosis Date Noted  . Metastasis to bone ([place]) 06/01/2016  . Pneumothorax on left 04/25/2016  . Chest pain 10/23/2014  . Radiation-induced dermatitis 07/27/2012  . Hypertension   . Breast cancer of upper-outer quadrant of left female breast ([place]) 09/01/2011  . Hypothyroidism 06/12/2009  . HYPERLIPIDEMIA 06/12/2009  . Essential hypertension 06/12/2009  . Coronary atherosclerosis 06/12/2009  . DIZZINESS 06/12/2009  . PALPITATIONS 06/12/2009  . Heart attack ([place]) 09/16/2000    Past Surgical History:  Procedure Laterality Date  . ABDOMINAL HYSTERECTOMY  1998   TAH, oophorectomy  . APPENDECTOMY  1970  . BREAST SURGERY  1998   removal of benign lump in rt breast  . BREAST SURGERY   11/14/11   right simple mastectomy, left mrm  . MASTECTOMY Bilateral    for left breast cancer  . OVARIAN CYST SURGERY Right 1970  . PORT-A-CATH REMOVAL  08/30/2012   Procedure: REMOVAL PORT-A-CATH;  Surgeon: Imogene Burn. [name], MD;  Location: [place];  Service: General;  Laterality: Right;  port removal  . PORTACATH PLACEMENT  11/14/2011   Procedure: INSERTION PORT-A-CATH;  Surgeon: Imogene Burn. [name], MD;  Location: [place];  Service: General;  Laterality: Right;  . PORTACATH PLACEMENT N/A 04/25/2016   Procedure: INSERTION PORT-A-CATH LEFT CHEST;  Surgeon: Donnie Mesa, MD;  Location: [place];  Service: General;  Laterality: N/A;  . skin tags  05/09/1997   left axillary left neck skin tags  . TONSILLECTOMY  1968    OB History    Gravida Para Term Preterm AB Living   _0 SAB TAB Ectopic Multiple Live Births   1               Home Medications    Prior to Admission medications   Medication Sig Start Date End Date Taking? Authorizing Provider  acetaminophen (TYLENOL) 500 MG tablet Take 1,000 mg by mouth every 6 (six) hours as needed for moderate pain.   Yes Historical Provider, MD  aspirin 81 MG tablet Take 81 mg by  mouth daily.     Yes Historical Provider, MD  Cholecalciferol (VITAMIN D-3 PO) Take 2,000 Int'l Units by mouth daily.   Yes Historical Provider, MD  lapatinib (TYKERB) 250 MG tablet Take 5 tablets (1,250 mg total) by mouth daily. 04/13/16  Yes Nicholas Lose, MD  levothyroxine (SYNTHROID, LEVOTHROID) 75 MCG tablet Take 75 mcg by mouth daily.    Yes Historical Provider, MD  metoprolol succinate (TOPROL-XL) 100 MG 24 hr tablet Take 1 tablet (100 mg total) by mouth daily. 04/13/16  Yes Nicholas Lose, MD  Multiple Vitamins-Minerals (MULTIVITAMIN WITH MINERALS) tablet Take 1 tablet by mouth daily.     Yes Historical Provider, MD  doxycycline (VIBRAMYCIN) 100 MG capsule Take 1 capsule (100 mg total) by mouth 2 (two) times daily. 07/03/16   Leonard Schwartz, MD  lidocaine-prilocaine (EMLA) cream Apply to affected area once 04/13/16   Nicholas Lose, MD    Family History Family History  Problem Relation Age of Onset  . Hypertension Maternal Grandmother   . Diabetes Maternal Grandmother   . Cancer Father 36    lung cancer and Prostate Cancer  . Hypertension Mother   . Cancer Paternal Aunt     ovarian  . Cancer Cousin     breast, paternal cousin  . Cancer Paternal Uncle     stomach  . Cancer Paternal Grandfather     Esophagus  . Colon cancer Neg Hx     Social History Social History  Substance Use Topics  . Smoking status: Never Smoker  . Smokeless tobacco: Never Used  . Alcohol use No     Allergies   Contrast media [iodinated diagnostic agents] and Food   Review of Systems Review of Systems  All other systems reviewed and are negative Physical Exam Updated Vital Signs BP 173/74 (BP Location: Right Arm)   Pulse 88   Temp 98.9 F (37.2 C) (Oral)   Resp 16   Ht _0  (1.727 m)   Wt 164 lb (74.4 kg)   SpO2 97%   BMI 24.94 kg/m   Physical Exam  Constitutional: She is oriented to person, place, and time. She appears well-developed and well-nourished. No distress.  HENT:  Head: Normocephalic and atraumatic.  Eyes: Pupils are equal, round, and reactive to light.  Neck: Normal range of motion.  Cardiovascular: Normal rate and intact distal pulses.   Pulmonary/Chest: No respiratory distress.  Abdominal: Normal appearance and bowel sounds are normal. She exhibits mass. She exhibits no distension.    Musculoskeletal: Normal range of motion.  Feet:  Right Foot:  Skin Integrity: Positive for erythema (Skin infection noted on dorsal aspect of third toe).  Neurological: She is alert and oriented to person, place, and time. No cranial nerve deficit.  Skin: Skin is warm and dry. No rash noted.  Psychiatric: She has a normal mood and affect. Her behavior is normal.  Nursing note and vitals reviewed.    ED  Treatments / Results  Labs (all labs ordered are listed, but only abnormal results are displayed) Labs Reviewed  BASIC METABOLIC PANEL - Abnormal; Notable for the following:       Result Value   Potassium 3.4 (*)    Glucose, Bld 110 (*)    All other components within normal limits  AEROBIC CULTURE (SUPERFICIAL SPECIMEN)  CBC WITH DIFFERENTIAL/PLATELET    EKG  EKG Interpretation None       Radiology No results found.  Procedures .Marland KitchenIncision and Drainage Date/Time: 07/03/2016 10:49 AM Performed by:  Leonard Schwartz Authorized by: Leonard Schwartz   Consent:    Consent obtained:  Verbal   Consent given by:  Patient   Risks discussed:  Bleeding, incomplete drainage, pain and damage to other organs   Alternatives discussed:  No treatment Location:    Type:  Abscess   Location:  Trunk   Trunk location:  Abdomen Pre-procedure details:    Skin preparation:  Chloraprep Anesthesia (see MAR for exact dosages):    Anesthesia method:  Local infiltration   Local anesthetic:  Lidocaine 2% WITH epi Procedure type:    Complexity:  Simple Procedure details:    Needle aspiration: no     Incision types:  Stab incision   Scalpel blade:  11   Wound management:  Probed and deloculated   Drainage:  Purulent   Drainage amount:  Moderate   Wound treatment:  Wound left open   Packing materials:  1/2 in iodoform gauze Post-procedure details:    Patient tolerance of procedure:  Tolerated well, no immediate complications   (including critical care time)  Medications Ordered in ED Medications  lidocaine-EPINEPHrine (XYLOCAINE-EPINEPHrine) 1 %-1:200000 (PF) injection (  Given by Other 07/03/16 1036)     Initial Impression / Assessment and Plan / ED Course  I have reviewed the triage vital signs and the nursing notes.  Pertinent labs & imaging results that were available during my care of the patient were reviewed by me and considered in my medical decision making (see chart for  details).  Clinical Course   Packing was placed in the wound and patient was instructed on wound care.  She'll be started on doxycycline and she has Bactroban at home also views.  She is to have a wound recheck on Tuesday or Wednesday of next week.  She is return to emergency department should she develop increasing pain or fever.   Final Clinical Impressions(s) / ED Diagnoses   Final diagnoses:  Abscess  Skin infection    New Prescriptions New Prescriptions   DOXYCYCLINE (VIBRAMYCIN) 100 MG CAPSULE    Take 1 capsule (100 mg total) by mouth 2 (two) times daily.     Leonard Schwartz, MD 07/03/16 1100

## 2016-07-05 ENCOUNTER — Telehealth: Payer: Self-pay | Admitting: *Deleted

## 2016-07-05 LAB — AEROBIC CULTURE W GRAM STAIN (SUPERFICIAL SPECIMEN)

## 2016-07-05 LAB — AEROBIC CULTURE  (SUPERFICIAL SPECIMEN)

## 2016-07-05 NOTE — Telephone Encounter (Signed)
Reviewed information with Dr. Lindi Adie who wishes for pt to be seen tomorrow by Selena Lesser, NP in [facility].  Spoke with Selena Lesser, NP and gave report regarding this pt.  Called to inform pt she would be expecting a call from our scheduling department with specific time for appt tomorrow.

## 2016-07-05 NOTE — Telephone Encounter (Signed)
"  I went to ED 07-03-2016 with a stomach abscess that was lanced.  I also have an abscess on my foot that is swollen.  I am on an antibiotic and need to see Dr. Lindi Adie tomorrow.  Return number [phone]."

## 2016-07-06 ENCOUNTER — Telehealth (HOSPITAL_BASED_OUTPATIENT_CLINIC_OR_DEPARTMENT_OTHER): Payer: Self-pay | Admitting: Emergency Medicine

## 2016-07-06 ENCOUNTER — Ambulatory Visit (HOSPITAL_BASED_OUTPATIENT_CLINIC_OR_DEPARTMENT_OTHER): Payer: Managed Care, Other (non HMO) | Admitting: Nurse Practitioner

## 2016-07-06 ENCOUNTER — Encounter: Payer: Self-pay | Admitting: Nurse Practitioner

## 2016-07-06 VITALS — BP 161/78 | HR 88 | Temp 98.6°F | Resp 17 | Ht 68.0 in | Wt 160.8 lb

## 2016-07-06 DIAGNOSIS — IMO0002 Reserved for concepts with insufficient information to code with codable children: Secondary | ICD-10-CM

## 2016-07-06 DIAGNOSIS — C50412 Malignant neoplasm of upper-outer quadrant of left female breast: Secondary | ICD-10-CM

## 2016-07-06 DIAGNOSIS — C7951 Secondary malignant neoplasm of bone: Secondary | ICD-10-CM | POA: Diagnosis not present

## 2016-07-06 DIAGNOSIS — L02211 Cutaneous abscess of abdominal wall: Secondary | ICD-10-CM | POA: Diagnosis not present

## 2016-07-06 NOTE — Progress Notes (Signed)
SYMPTOM MANAGEMENT CLINIC    Chief Complaint: Abscess  HPI:  [name] Riley 65 y.o. female diagnosed with breast cancer; with bone metastasis.  Currently undergoing Herceptin/Zometa/Faslodex therapy.     Breast cancer of upper-outer quadrant of left female breast ([place])   11/14/2011 Surgery    Bilateral mastectomy, prophylactic on the right, left breast IDC 3/18 lymph nodes positive with extracapsular extension ER 89%, PR 81%, HER-2 negative, Ki-67 79% T2 N1 A. stage IIB      12/13/2011 - 06/28/2012 Chemotherapy    4 cycles of FEC followed by 4 cycles of Taxotere      07/17/2012 - 08/22/2012 Radiation Therapy    Adjuvant radiation therapy      08/22/2012 - 03/16/2016 Anti-estrogen oral therapy    Arimidex 1 mg daily      03/16/2016 Relapse/Recurrence    Subcutaneous nodule excision left chest: Infiltrating carcinoma breast primary, ER positive, PR negative      03/29/2016 Imaging    CT CAP and bone scan: Lytic lesions T8 vertebral, T1 posterior element, subcutaneous nodule left lateral chest wall, nonspecific lung nodules; Bone scan: Mets to kull, left humerus, left eighth rib, T7/T8, sternum, left acetabulum      04/28/2016 -  Chemotherapy    Herceptin, lapatinib, Faslodex, Zometa every 4 weeks       Review of Systems  Skin:       Abscess to the right lower abdominal region and a infected insect bite to the top of the right foot.  All other systems reviewed and are negative.   Past Medical History:  Diagnosis Date  . Allergy   . Breast cancer ([place]) 08/25/2011   L , invasive ductal carcinoma, ER/PR +,HER2 -  . Cancer [facility])    left breast cancer  . Coronary artery disease 2001  . Heart attack [facility]) 09/2000   Sep 25, 2000  --no intervention  . History of chemotherapy comp. 08/22/2012   4 cycles of FEC and $ cycles of Taxotere  . Hyperlipidemia   . Hypertension   . Hypothyroidism   . PONV (postoperative nausea and vomiting)    gets sick from anesthesia  . Status  post radiation therapy 07/09/12 - 08/22/2012   Left Breast, 60.4 gray    Past Surgical History:  Procedure Laterality Date  . ABDOMINAL HYSTERECTOMY  1998   TAH, oophorectomy  . APPENDECTOMY  1970  . BREAST SURGERY  1998   removal of benign lump in rt breast  . BREAST SURGERY  11/14/11   right simple mastectomy, left mrm  . MASTECTOMY Bilateral    for left breast cancer  . OVARIAN CYST SURGERY Right 1970  . PORT-A-CATH REMOVAL  08/30/2012   Procedure: REMOVAL PORT-A-CATH;  Surgeon: Imogene Burn. [name], MD;  Location: [place];  Service: General;  Laterality: Right;  port removal  . PORTACATH PLACEMENT  11/14/2011   Procedure: INSERTION PORT-A-CATH;  Surgeon: Imogene Burn. [name], MD;  Location: [place];  Service: General;  Laterality: Right;  . PORTACATH PLACEMENT N/A 04/25/2016   Procedure: INSERTION PORT-A-CATH LEFT CHEST;  Surgeon: Donnie Mesa, MD;  Location: [place];  Service: General;  Laterality: N/A;  . skin tags  05/09/1997   left axillary left neck skin tags  . TONSILLECTOMY  1968    has Hypothyroidism; HYPERLIPIDEMIA; Essential hypertension; Coronary atherosclerosis; DIZZINESS; PALPITATIONS; Breast cancer of upper-outer quadrant of left female breast ([place]); Heart attack ([place]); Hypertension; Radiation-induced dermatitis; Chest pain; Pneumothorax on left; Metastasis to  bone ([place]); and Abscess, abdomen ([place]) on her problem list.    is allergic to contrast media [iodinated diagnostic agents] and food.    Medication List       Accurate as of 07/06/16  9:43 PM. Always use your most recent med list.          acetaminophen 500 MG tablet Commonly known as:  TYLENOL Take 1,000 mg by mouth every 6 (six) hours as needed for moderate pain.   aspirin 81 MG tablet Take 81 mg by mouth daily.   doxycycline 100 MG capsule Commonly known as:  VIBRAMYCIN Take 1 capsule (100 mg total) by mouth 2 (two) times daily.   lapatinib 250 MG tablet Commonly known  as:  TYKERB Take 5 tablets (1,250 mg total) by mouth daily.   levothyroxine 75 MCG tablet Commonly known as:  SYNTHROID, LEVOTHROID Take 75 mcg by mouth daily.   lidocaine-prilocaine cream Commonly known as:  EMLA Apply to affected area once   metoprolol succinate 100 MG 24 hr tablet Commonly known as:  TOPROL-XL Take 1 tablet (100 mg total) by mouth daily.   multivitamin with minerals tablet Take 1 tablet by mouth daily.   VITAMIN D-3 PO Take 2,000 Int'l Units by mouth daily.        PHYSICAL EXAMINATION  Oncology Vitals 07/06/2016 07/03/2016  Height 173 cm -  Weight 72.938 kg -  Weight (lbs) 160 lbs 13 oz -  BMI (kg/m2) 24.45 kg/m2 -  Temp 98.6 98.7  Pulse 88 81  Resp 17 20  Resp (Historical as of 05/17/12) - -  SpO2 100 97  BSA (m2) 1.87 m2 -   BP Readings from Last 2 Encounters:  07/06/16 (!) 161/78  07/03/16 160/87    Physical Exam  Constitutional: She is oriented to person, place, and time and well-developed, well-nourished, and in no distress.  HENT:  Head: Normocephalic and atraumatic.  Eyes: Conjunctivae and EOM are normal. Pupils are equal, round, and reactive to light.  Neck: Normal range of motion.  Pulmonary/Chest: Effort normal. No respiratory distress.  Musculoskeletal: Normal range of motion. She exhibits edema. She exhibits no tenderness.  Exam today reveals a slowly resolving abscess to the right lower abdominal region.  There is still some mild serous drainage at the site.  There is no worsening erythema, edema, warmth, or tenderness to the site.  The packing was removed; and new packing was placed.  The wound appears to be approximately 1-1/2 cm depth; and approximately packing was placed in the wound.  Patient tolerated the procedure well.  Also, patient has what appears to be a mildly infected insect bite to the top of her right foot at the base of her toes.  The wound itself appears to be resolving; the patient continues with edema to the right  foot.  Patient was advised to continue elevating her foot; and continue the antibiotics as well.    Neurological: She is alert and oriented to person, place, and time. Gait normal.  Skin: Skin is warm and dry.  Psychiatric: Affect normal.  Nursing note and vitals reviewed.   LABORATORY DATA:. No visits with results within 3 Day(s) from this visit.  Latest known visit with results is:  Admission on 07/03/2016, Discharged on 07/03/2016  Component Date Value Ref Range Status  . WBC 07/03/2016 9.5  4.0 - 10.5 K/uL Final  . RBC 07/03/2016 4.07  3.87 - 5.11 MIL/uL Final  . Hemoglobin 07/03/2016 12.5  12.0 - 15.0 g/dL Final  .  HCT 07/03/2016 38.1  36.0 - 46.0 % Final  . MCV 07/03/2016 93.6  78.0 - 100.0 fL Final  . MCH 07/03/2016 30.7  26.0 - 34.0 pg Final  . MCHC 07/03/2016 32.8  30.0 - 36.0 g/dL Final  . RDW 07/03/2016 13.7  11.5 - 15.5 % Final  . Platelets 07/03/2016 278  150 - 400 K/uL Final  . Neutrophils Relative % 07/03/2016 76  % Final  . Neutro Abs 07/03/2016 7.2  1.7 - 7.7 K/uL Final  . Lymphocytes Relative 07/03/2016 15  % Final  . Lymphs Abs 07/03/2016 1.4  0.7 - 4.0 K/uL Final  . Monocytes Relative 07/03/2016 9  % Final  . Monocytes Absolute 07/03/2016 0.8  0.1 - 1.0 K/uL Final  . Eosinophils Relative 07/03/2016 0  % Final  . Eosinophils Absolute 07/03/2016 0.0  0.0 - 0.7 K/uL Final  . Basophils Relative 07/03/2016 0  % Final  . Basophils Absolute 07/03/2016 0.0  0.0 - 0.1 K/uL Final  . Sodium 07/03/2016 139  135 - 145 mmol/L Final  . Potassium 07/03/2016 3.4* 3.5 - 5.1 mmol/L Final  . Chloride 07/03/2016 106  101 - 111 mmol/L Final  . CO2 07/03/2016 26  22 - 32 mmol/L Final  . Glucose, Bld 07/03/2016 110* 65 - 99 mg/dL Final  . BUN 07/03/2016 11  6 - 20 mg/dL Final  . Creatinine, Ser 07/03/2016 0.54  0.44 - 1.00 mg/dL Final  . Calcium 07/03/2016 9.2  8.9 - 10.3 mg/dL Final  . GFR calc non Af Amer 07/03/2016 >60  >60 mL/min Final  . GFR calc Af Amer 07/03/2016 >60  >60  mL/min Final   Comment: (NOTE) The eGFR has been calculated using the CKD EPI equation. This calculation has not been validated in all clinical situations. eGFR's persistently <60 mL/min signify possible Chronic Kidney Disease.   . Anion gap 07/03/2016 7  5 - 15 Final  . Specimen Description 07/05/2016 ABDOMEN SKIN   Final  . Special Requests 07/05/2016 Immunocompromised   Final  . Gram Stain 07/05/2016    Final                   Value:FEW WBC PRESENT, PREDOMINANTLY PMN ABUNDANT GRAM POSITIVE COCCI IN PAIRS IN CLUSTERS Performed at [facility]   . Culture 07/05/2016 ABUNDANT METHICILLIN RESISTANT STAPHYLOCOCCUS AUREUS   Final  . Report Status 07/05/2016 07/05/2016 FINAL   Final  . Organism ID, Bacteria 07/05/2016 METHICILLIN RESISTANT STAPHYLOCOCCUS AUREUS   Final      RADIOGRAPHIC STUDIES: No results found.  ASSESSMENT/PLAN:    Breast cancer of upper-outer quadrant of left female breast [facility]) Patient received her last Herceptin/Zometa/Faslodex on 06/22/2016.  She is scheduled to return for labs, visit, and her next cycle of treatment on 07/20/2016.  Abscess, abdomen [facility]) Patient was seen in the emergency department on Sunday 07/03/16 for treatment of abscesses to the right lower abdomen and the right foot.  Patient states that she has a history of chronic abscesses and frequently requires drainage of the abscesses.  Patient states that the right lower abdominal wall abscess was drained and packed this weekend.  She states that the wound to the top of her right foot may actually be an insect bite that she has scratched since become infected.  She was advised to soak her foot elevated.  He also continues to take the doxycycline that was prescribed for her.  Exam today reveals a slowly resolving abscess to the right lower abdominal  region.  There is still some mild serous drainage at the site.  There is no worsening erythema, edema, warmth, or tenderness to the site.  The  packing was removed; and new packing was placed.  The wound appears to be approximately 1-1/2 cm depth; and approximately packing was placed in the wound.  Patient tolerated the procedure well.  Also, patient has what appears to be a mildly infected insect bite to the top of her right foot at the base of her toes.  The wound itself appears to be resolving; the patient continues with edema to the right foot.  Patient was advised to continue elevating her foot; and continue the antibiotics as well.  Both patient and her husband were advised that she would need close follow-up.  If her foot or her abdominal wound worsens-patient will need to be seen by her surgeon at [facility] surgery.  The husband also stated that patient has a podiatrist as well; and he will call the podiatrist for follow-up as well.  Patient was advised to call/return or go directly to the emergency department for any worsening symptoms whatsoever.   Patient stated understanding of all instructions; and was in agreement with this plan of care. The patient knows to call the clinic with any problems, questions or concerns.   Total time spent with patient was 40 minutes;  with greater than 75 percent of that time spent in face to face counseling regarding patient's symptoms,  and coordination of care and follow up.  Disclaimer:This dictation was prepared with Dragon/digital dictation along with [employer]. Any transcriptional errors that result from this process are unintentional.  Drue Second, NP 07/06/2016

## 2016-07-06 NOTE — Assessment & Plan Note (Signed)
Patient was seen in the emergency department on Sunday 07/03/16 for treatment of abscesses to the right lower abdomen and the right foot.  Patient states that she has a history of chronic abscesses and frequently requires drainage of the abscesses.  Patient states that the right lower abdominal wall abscess was drained and packed this weekend.  She states that the wound to the top of her right foot may actually be an insect bite that she has scratched since become infected.  She was advised to soak her foot elevated.  He also continues to take the doxycycline that was prescribed for her.  Exam today reveals a slowly resolving abscess to the right lower abdominal region.  There is still some mild serous drainage at the site.  There is no worsening erythema, edema, warmth, or tenderness to the site.  The packing was removed; and new packing was placed.  The wound appears to be approximately 1-1/2 cm depth; and approximately packing was placed in the wound.  Patient tolerated the procedure well.  Also, patient has what appears to be a mildly infected insect bite to the top of her right foot at the base of her toes.  The wound itself appears to be resolving; the patient continues with edema to the right foot.  Patient was advised to continue elevating her foot; and continue the antibiotics as well.  Both patient and her husband were advised that she would need close follow-up.  If her foot or her abdominal wound worsens-patient will need to be seen by her surgeon at [facility] surgery.  The husband also stated that patient has a podiatrist as well; and he will call the podiatrist for follow-up as well.  Patient was advised to call/return or go directly to the emergency department for any worsening symptoms whatsoever.

## 2016-07-06 NOTE — Telephone Encounter (Signed)
Post ED Visit - Positive Culture Follow-up  Culture report reviewed by antimicrobial stewardship pharmacist:  []  Elenor Quinones, Pharm.D. []  Heide Guile, Pharm.D., BCPS []  Parks Neptune, Pharm.D. []  Alycia Rossetti, Pharm.D., BCPS []  [place]amar, [place].D., BCPS, AAHIVP []  Legrand Como, Pharm.D., BCPS, AAHIVP []  Milus Glazier, Pharm.D. []  Stephens November, Pharm.Amedeo Gory PharmD  Positive wound culture Treated with doxycycline, organism sensitive to the same and no further patient follow-up is required at this time.  Hazle Nordmann 07/06/2016, 9:16 AM

## 2016-07-06 NOTE — Assessment & Plan Note (Addendum)
Patient received her last Herceptin/Zometa/Faslodex on 06/22/2016.  She is scheduled to return for labs, visit, and her next cycle of treatment on 07/20/2016.

## 2016-07-08 ENCOUNTER — Telehealth: Payer: Self-pay | Admitting: *Deleted

## 2016-07-08 NOTE — Telephone Encounter (Signed)
TC to patient in follow up to her visit to [facility] on 07/06/16 for abd wound care and re-dressing and evaluation of similar wound on right foot.  Pt states that her right lower quadrant wound is feeling much better. Minimal drainage. Minimal pain. She states her right foot wound has started draining bloody yellow drainage. She is soaking it in epsom salts at least 4-5 x a day and putting triple antibiotic ointment on it. She is not wearing any shoes-just a cotton sock on that foot. She states she is still having pain in that foot-especially when she stands up and puuts that first instance of weight on her foot. Selling continues when standing up-she keeps it elevated most of the time and at night..  Advised pt to seek ED evaluation if foot worsens over the weekend. If she does not go to ED over weekend then she needs to call [place] Monday am to set up time to go to their Urgent Care Clinic. Pt voiced understanding.

## 2016-07-12 ENCOUNTER — Telehealth: Payer: Self-pay

## 2016-07-12 NOTE — Telephone Encounter (Signed)
Pt states she saw Dr Steffanie Rainwater today, a pediatrist. He gave her another week of antibiotic. She felt much better today.

## 2016-07-14 ENCOUNTER — Other Ambulatory Visit: Payer: Managed Care, Other (non HMO)

## 2016-07-19 NOTE — Assessment & Plan Note (Signed)
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 Was on Arimidex since 08/22/2012 to 03/30/16  Subcutaneous nodule excisionleft chest: Infiltrating carcinoma breast primary, ER positive, PR negative, HER-2 positive  CT CAP and bone scan06/13/2017: Lytic lesions T8 vertebral, T1 posterior element, subcutaneous nodule left lateral chest wall, nonspecific lung nodules; Bone scan: Mets to kull, left humerus, left eighth rib, T7/T8, sternum, left acetabulum. (In retrospect, original breast MRI in 2002 revealed sternal lesions concerning for metastatic disease, PET CT scan did not reveal metastases)  Goals of treatment: Palliation and prolongation of life. Patient understands that stage IV breast cancer cannot be cured but it can be managed with fairly long survivals.  Treatment Plan: Herceptin Q 4 weeks, Lapatinib, Faslodex, Zometa, started 04/28/2016, today is cycle 4  Toxicities: 1. Diarrhea from lapatinib 2. Injection site discomfort from Faslodex  CT CAP scheduled for 07/22/16  Echocardiogram in [place]   RTC in 4 weeks

## 2016-07-20 ENCOUNTER — Telehealth: Payer: Self-pay

## 2016-07-20 ENCOUNTER — Other Ambulatory Visit (HOSPITAL_BASED_OUTPATIENT_CLINIC_OR_DEPARTMENT_OTHER): Payer: Managed Care, Other (non HMO)

## 2016-07-20 ENCOUNTER — Other Ambulatory Visit: Payer: Self-pay

## 2016-07-20 ENCOUNTER — Encounter: Payer: Self-pay | Admitting: Hematology and Oncology

## 2016-07-20 ENCOUNTER — Ambulatory Visit: Payer: Managed Care, Other (non HMO)

## 2016-07-20 ENCOUNTER — Telehealth: Payer: Self-pay | Admitting: Hematology and Oncology

## 2016-07-20 ENCOUNTER — Ambulatory Visit (HOSPITAL_BASED_OUTPATIENT_CLINIC_OR_DEPARTMENT_OTHER): Payer: Managed Care, Other (non HMO) | Admitting: Hematology and Oncology

## 2016-07-20 ENCOUNTER — Ambulatory Visit (HOSPITAL_BASED_OUTPATIENT_CLINIC_OR_DEPARTMENT_OTHER): Payer: Managed Care, Other (non HMO)

## 2016-07-20 VITALS — BP 160/80 | HR 79 | Temp 98.5°F | Resp 18 | Ht 68.0 in | Wt 162.7 lb

## 2016-07-20 DIAGNOSIS — C7951 Secondary malignant neoplasm of bone: Secondary | ICD-10-CM

## 2016-07-20 DIAGNOSIS — Z170 Estrogen receptor positive status [ER+]: Secondary | ICD-10-CM

## 2016-07-20 DIAGNOSIS — Z5111 Encounter for antineoplastic chemotherapy: Secondary | ICD-10-CM

## 2016-07-20 DIAGNOSIS — Z5112 Encounter for antineoplastic immunotherapy: Secondary | ICD-10-CM | POA: Diagnosis not present

## 2016-07-20 DIAGNOSIS — R911 Solitary pulmonary nodule: Secondary | ICD-10-CM

## 2016-07-20 DIAGNOSIS — C50412 Malignant neoplasm of upper-outer quadrant of left female breast: Secondary | ICD-10-CM

## 2016-07-20 DIAGNOSIS — Z23 Encounter for immunization: Secondary | ICD-10-CM | POA: Diagnosis not present

## 2016-07-20 LAB — COMPREHENSIVE METABOLIC PANEL
ALT: 32 U/L (ref 0–55)
AST: 29 U/L (ref 5–34)
Albumin: 4 g/dL (ref 3.5–5.0)
Alkaline Phosphatase: 146 U/L (ref 40–150)
Anion Gap: 11 mEq/L (ref 3–11)
BUN: 12 mg/dL (ref 7.0–26.0)
CO2: 27 mEq/L (ref 22–29)
Calcium: 9.6 mg/dL (ref 8.4–10.4)
Chloride: 104 mEq/L (ref 98–109)
Creatinine: 0.7 mg/dL (ref 0.6–1.1)
EGFR: >90 mL/min/{1.73_m2} (ref >90)
Glucose: 82 mg/dl (ref 70–140)
Potassium: 3.5 mEq/L (ref 3.5–5.1)
Sodium: 142 mEq/L (ref 136–145)
Total Bilirubin: 0.54 mg/dL (ref 0.20–1.20)
Total Protein: 7.6 g/dL (ref 6.4–8.3)

## 2016-07-20 LAB — CBC WITH DIFFERENTIAL/PLATELET
BASO%: 0.4 % (ref 0.0–2.0)
Basophils Absolute: 0 10*3/uL (ref 0.0–0.1)
EOS%: 3.5 % (ref 0.0–7.0)
Eosinophils Absolute: 0.2 10*3/uL (ref 0.0–0.5)
HCT: 38.7 % (ref 34.8–46.6)
HGB: 12.8 g/dL (ref 11.6–15.9)
LYMPH%: 41.9 % (ref 14.0–49.7)
MCH: 30.4 pg (ref 25.1–34.0)
MCHC: 33.1 g/dL (ref 31.5–36.0)
MCV: 91.9 fL (ref 79.5–101.0)
MONO#: 0.5 10*3/uL (ref 0.1–0.9)
MONO%: 10.4 % (ref 0.0–14.0)
NEUT#: 2.1 10*3/uL (ref 1.5–6.5)
NEUT%: 43.8 % (ref 38.4–76.8)
Platelets: 256 10*3/uL (ref 145–400)
RBC: 4.21 10*6/uL (ref 3.70–5.45)
RDW: 13.2 % (ref 11.2–14.5)
WBC: 4.9 10*3/uL (ref 3.9–10.3)
lymph#: 2.1 10*3/uL (ref 0.9–3.3)

## 2016-07-20 MED ORDER — ACETAMINOPHEN 325 MG PO TABS
ORAL_TABLET | ORAL | Status: AC
  Filled 2016-07-20: qty 2

## 2016-07-20 MED ORDER — PREDNISONE 50 MG PO TABS: ORAL_TABLET | ORAL | 0 refills | Status: DC

## 2016-07-20 MED ORDER — HEPARIN SOD (PORK) LOCK FLUSH 100 UNIT/ML IV SOLN
500.0000 [IU] | Freq: Once | INTRAVENOUS | Status: AC | PRN
  Administered 2016-07-20: 500 [IU]
  Filled 2016-07-20: qty 5

## 2016-07-20 MED ORDER — SODIUM CHLORIDE 0.9 % IV SOLN
Freq: Once | INTRAVENOUS | Status: AC
  Administered 2016-07-20: 10:00:00 via INTRAVENOUS

## 2016-07-20 MED ORDER — INFLUENZA VAC SPLIT QUAD 0.5 ML IM SUSY
0.5000 mL | PREFILLED_SYRINGE | Freq: Once | INTRAMUSCULAR | Status: AC
  Administered 2016-07-20: 0.5 mL via INTRAMUSCULAR
  Filled 2016-07-20: qty 0.5

## 2016-07-20 MED ORDER — SODIUM CHLORIDE 0.9 % IV SOLN
6.0000 mg/kg | Freq: Once | INTRAVENOUS | Status: AC
  Administered 2016-07-20: 462 mg via INTRAVENOUS
  Filled 2016-07-20: qty 22

## 2016-07-20 MED ORDER — ZOLEDRONIC ACID 4 MG/100ML IV SOLN
4.0000 mg | Freq: Once | INTRAVENOUS | Status: AC
  Administered 2016-07-20: 4 mg via INTRAVENOUS
  Filled 2016-07-20: qty 100

## 2016-07-20 MED ORDER — FULVESTRANT 250 MG/5ML IM SOLN
500.0000 mg | INTRAMUSCULAR | Status: DC
  Administered 2016-07-20: 500 mg via INTRAMUSCULAR
  Filled 2016-07-20: qty 10

## 2016-07-20 MED ORDER — SODIUM CHLORIDE 0.9% FLUSH
10.0000 mL | INTRAVENOUS | Status: DC | PRN
Start: 2016-07-20 — End: 2016-07-20
  Administered 2016-07-20: 10 mL
  Filled 2016-07-20: qty 10

## 2016-07-20 MED ORDER — ACETAMINOPHEN 325 MG PO TABS
650.0000 mg | ORAL_TABLET | Freq: Once | ORAL | Status: AC
  Administered 2016-07-20: 650 mg via ORAL

## 2016-07-20 MED ORDER — DIPHENHYDRAMINE HCL 25 MG PO CAPS
50.0000 mg | ORAL_CAPSULE | Freq: Once | ORAL | Status: AC
  Administered 2016-07-20: 50 mg via ORAL

## 2016-07-20 MED ORDER — DIPHENHYDRAMINE HCL 25 MG PO CAPS
ORAL_CAPSULE | ORAL | Status: AC
  Filled 2016-07-20: qty 2

## 2016-07-20 NOTE — Progress Notes (Signed)
Patient Care Team: [name], MD as PCP - General (Family Medicine)  DIAGNOSIS: Breast cancer of upper-outer quadrant of left female breast [facility])   Staging form: Breast, AJCC 7th Edition   - Clinical: Stage IIB (T2, N1, cM0) - Unsigned   - Pathologic: No stage assigned - Unsigned  SUMMARY OF ONCOLOGIC HISTORY:   Breast cancer of upper-outer quadrant of left female breast ([place])   11/14/2011 Surgery    Bilateral mastectomy, prophylactic on the right, left breast IDC 3/18 lymph nodes positive with extracapsular extension ER 89%, PR 81%, HER-2 negative, Ki-67 79% T2 N1 A. stage IIB      12/13/2011 - 06/28/2012 Chemotherapy    4 cycles of FEC followed by 4 cycles of Taxotere      07/17/2012 - 08/22/2012 Radiation Therapy    Adjuvant radiation therapy      08/22/2012 - 03/16/2016 Anti-estrogen oral therapy    Arimidex 1 mg daily      03/16/2016 Relapse/Recurrence    Subcutaneous nodule excision left chest: Infiltrating carcinoma breast primary, ER positive, PR negative      03/29/2016 Imaging    CT CAP and bone scan: Lytic lesions T8 vertebral, T1 posterior element, subcutaneous nodule left lateral chest wall, nonspecific lung nodules; Bone scan: Mets to kull, left humerus, left eighth rib, T7/T8, sternum, left acetabulum      04/28/2016 -  Chemotherapy    Herceptin, lapatinib, Faslodex, Zometa every 4 weeks       CHIEF COMPLIANT: Recent problems with abdominal wall abscess and right foot infection  INTERVAL HISTORY: [name] Riley is a 65 year old with above-mentioned history of metastatic breast cancer currently on Herceptin and lapatinib Faslodex and Zometa. She is tolerating the treatment fairly well. Recently she had been suffering from abdominal wall abscess which had to be drained in the emergency room. It has finally healed completely. She also had a lesion on her toe which got better with antibiotics and follow-up. She has just been recovering and would like to postpone  her CT scan done end of the month because she is busy with her work.  REVIEW OF SYSTEMS:   Constitutional: Denies fevers, chills or abnormal weight loss Eyes: Denies blurriness of vision Ears, nose, mouth, throat, and face: Denies mucositis or sore throat Respiratory: Denies cough, dyspnea or wheezes Cardiovascular: Denies palpitation, chest discomfort Gastrointestinal:  Denies nausea, heartburn or change in bowel habits, recent abdominal abscess drained Skin: Denies abnormal skin rashes Lymphatics: Denies new lymphadenopathy or easy bruising Neurological:Denies numbness, tingling or new weaknesses Behavioral/Psych: Mood is stable, no new changes  Extremities: Right in between toe infection  All other systems were reviewed with the patient and are negative.  I have reviewed the past medical history, past surgical history, social history and family history with the patient and they are unchanged from previous note.  ALLERGIES:  is allergic to contrast media [iodinated diagnostic agents] and food.  MEDICATIONS:  Current Outpatient Prescriptions  Medication Sig Dispense Refill  . acetaminophen (TYLENOL) 500 MG tablet Take 1,000 mg by mouth every 6 (six) hours as needed for moderate pain.    Marland Kitchen aspirin 81 MG tablet Take 81 mg by mouth daily.      . Cholecalciferol (VITAMIN D-3 PO) Take 2,000 Int'l Units by mouth daily.    Marland Kitchen doxycycline (VIBRAMYCIN) 100 MG capsule Take 1 capsule (100 mg total) by mouth 2 (two) times daily. 20 capsule 0  . lapatinib (TYKERB) 250 MG tablet Take 5 tablets (1,250 mg total)  by mouth daily. 150 tablet 3  . levothyroxine (SYNTHROID, LEVOTHROID) 75 MCG tablet Take 75 mcg by mouth daily.     Marland Kitchen lidocaine-prilocaine (EMLA) cream Apply to affected area once 30 g 3  . metoprolol succinate (TOPROL-XL) 100 MG 24 hr tablet Take 1 tablet (100 mg total) by mouth daily. 90 tablet 3  . Multiple Vitamins-Minerals (MULTIVITAMIN WITH MINERALS) tablet Take 1 tablet by mouth daily.        No current facility-administered medications for this visit.     PHYSICAL EXAMINATION: ECOG PERFORMANCE STATUS: 1 - Symptomatic but completely ambulatory  Vitals:   07/20/16 0825  BP: (!) 160/80  Pulse: 79  Resp: 18  Temp: 98.5 F (36.9 C)   Filed Weights   07/20/16 0825  Weight: 162 lb 11.2 oz (73.8 kg)    GENERAL:alert, no distress and comfortable SKIN: skin color, texture, turgor are normal, no rashes or significant lesions EYES: normal, Conjunctiva are pink and non-injected, sclera clear OROPHARYNX:no exudate, no erythema and lips, buccal mucosa, and tongue normal  NECK: supple, thyroid normal size, non-tender, without nodularity LYMPH:  no palpable lymphadenopathy in the cervical, axillary or inguinal LUNGS: clear to auscultation and percussion with normal breathing effort HEART: regular rate & rhythm and no murmurs and no lower extremity edema ABDOMEN:abdomen soft, non-tender and normal bowel sounds MUSCULOSKELETAL:no cyanosis of digits and no clubbing  NEURO: alert & oriented x 3 with fluent speech, no focal motor/sensory deficits EXTREMITIES: No lower extremity edema  LABORATORY DATA:  I have reviewed the data as listed   Chemistry      Component Value Date/Time   NA 139 07/03/2016 0939   NA 144 06/22/2016 0828   K 3.4 (L) 07/03/2016 0939   K 3.4 (L) 06/22/2016 0828   CL 106 07/03/2016 0939   CL 105 08/10/2012 0908   CO2 26 07/03/2016 0939   CO2 26 06/22/2016 0828   BUN 11 07/03/2016 0939   BUN 9.9 06/22/2016 0828   CREATININE 0.54 07/03/2016 0939   CREATININE 0.7 06/22/2016 0828      Component Value Date/Time   CALCIUM 9.2 07/03/2016 0939   CALCIUM 9.4 06/22/2016 0828   ALKPHOS 157 (H) 06/22/2016 0828   AST 27 06/22/2016 0828   ALT 26 06/22/2016 0828   BILITOT 0.56 06/22/2016 0828       Lab Results  Component Value Date   WBC 4.9 07/20/2016   HGB 12.8 07/20/2016   HCT 38.7 07/20/2016   MCV 91.9 07/20/2016   PLT 256 07/20/2016    NEUTROABS 2.1 07/20/2016     ASSESSMENT & PLAN:  Breast cancer of upper-outer quadrant of left female breast ([facility]) Left breast invasive ductal carcinomaT2, N1, 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 Was on Arimidex since 08/22/2012 to 03/30/16  Subcutaneous nodule excisionleft chest: Infiltrating carcinoma breast primary, ER positive, PR negative, HER-2 positive  CT CAP and bone scan06/13/2017: Lytic lesions T8 vertebral, T1 posterior element, subcutaneous nodule left lateral chest wall, nonspecific lung nodules; Bone scan: Mets to kull, left humerus, left eighth rib, T7/T8, sternum, left acetabulum. (In retrospect, original breast MRI in 2002 revealed sternal lesions concerning for metastatic disease, PET CT scan did not reveal metastases)  Goals of treatment: Palliation and prolongation of life. Patient understands that stage IV breast cancer cannot be cured but it can be managed  with fairly long survivals.  Treatment Plan: Herceptin Q 4 weeks, Lapatinib, Faslodex, Zometa, started 04/28/2016, today is cycle 4  Toxicities: 1. Diarrhea from lapatinib 2. Injection site discomfort from Faslodex Lab work from today was reviewed.  CT CAP will be postponed until 08/10/2016  Echocardiogram in [place]   RTC in 4 weeks   No orders of the defined types were placed in this encounter.  The patient has a good understanding of the overall plan. she agrees with it. she will call with any problems that may develop before the next visit here.   Rulon Eisenmenger, MD 07/20/16

## 2016-07-20 NOTE — Telephone Encounter (Signed)
Attempted to contact pt to confirm which pharmacy she uses so prednisone prescription can be sent for upcoming CT scan.  Pt has allergy to contrast media and thus needs Prednisone 13, 7, and 1 hour prior to scan as well as Benadryl 50mg  PO.  Will continue to attempt to reach pt to confirm this information as VM is not set up and a message can not be left.

## 2016-07-20 NOTE — Patient Instructions (Signed)
[facility] Discharge Instructions for Patients Receiving Chemotherapy  Today you received the following chemotherapy agents Herceptin.  To help prevent nausea and vomiting after your treatment, we encourage you to take your nausea medication as directed.   If you develop nausea and vomiting that is not controlled by your nausea medication, call the clinic.   BELOW ARE SYMPTOMS THAT SHOULD BE REPORTED IMMEDIATELY:  *FEVER GREATER THAN 100.5 F  *CHILLS WITH OR WITHOUT FEVER  NAUSEA AND VOMITING THAT IS NOT CONTROLLED WITH YOUR NAUSEA MEDICATION  *UNUSUAL SHORTNESS OF BREATH  *UNUSUAL BRUISING OR BLEEDING  TENDERNESS IN MOUTH AND THROAT WITH OR WITHOUT PRESENCE OF ULCERS  *URINARY PROBLEMS  *BOWEL PROBLEMS  UNUSUAL RASH Items with * indicate a potential emergency and should be followed up as soon as possible.  Feel free to call the clinic you have any questions or concerns. The clinic phone number is [phone].  Please show the CHEMO ALERT CARD at check-in to the Emergency Department and triage nurse.  Zoledronic Acid injection (Hypercalcemia, Oncology) What is this medicine? ZOLEDRONIC ACID (ZOE le dron ik AS id) lowers the amount of calcium loss from bone. It is used to treat too much calcium in your blood from cancer. It is also used to prevent complications of cancer that has spread to the bone. This medicine may be used for other purposes; ask your health care provider or pharmacist if you have questions. What should I tell my health care provider before I take this medicine? They need to know if you have any of these conditions: -aspirin-sensitive asthma -cancer, especially if you are receiving medicines used to treat cancer -dental disease or wear dentures -infection -kidney disease -receiving corticosteroids like dexamethasone or prednisone -an unusual or allergic reaction to zoledronic acid, other medicines, foods, dyes, or  preservatives -pregnant or trying to get pregnant -breast-feeding How should I use this medicine? This medicine is for infusion into a vein. It is given by a health care professional in a hospital or clinic setting. Talk to your pediatrician regarding the use of this medicine in children. Special care may be needed. Overdosage: If you think you have taken too much of this medicine contact a poison control center or emergency room at once. NOTE: This medicine is only for you. Do not share this medicine with others. What if I miss a dose? It is important not to miss your dose. Call your doctor or health care professional if you are unable to keep an appointment. What may interact with this medicine? -certain antibiotics given by injection -NSAIDs, medicines for pain and inflammation, like ibuprofen or naproxen -some diuretics like bumetanide, furosemide -teriparatide -thalidomide This list may not describe all possible interactions. Give your health care provider a list of all the medicines, herbs, non-prescription drugs, or dietary supplements you use. Also tell them if you smoke, drink alcohol, or use illegal drugs. Some items may interact with your medicine. What should I watch for while using this medicine? Visit your doctor or health care professional for regular checkups. It may be some time before you see the benefit from this medicine. Do not stop taking your medicine unless your doctor tells you to. Your doctor may order blood tests or other tests to see how you are doing. Women should inform their doctor if they wish to become pregnant or think they might be pregnant. There is a potential for serious side effects to an unborn child. Talk to your health care professional or   pharmacist for more information. You should make sure that you get enough calcium and vitamin D while you are taking this medicine. Discuss the foods you eat and the vitamins you take with your health care  professional. Some people who take this medicine have severe bone, joint, and/or muscle pain. This medicine may also increase your risk for jaw problems or a broken thigh bone. Tell your doctor right away if you have severe pain in your jaw, bones, joints, or muscles. Tell your doctor if you have any pain that does not go away or that gets worse. Tell your dentist and dental surgeon that you are taking this medicine. You should not have major dental surgery while on this medicine. See your dentist to have a dental exam and fix any dental problems before starting this medicine. Take good care of your teeth while on this medicine. Make sure you see your dentist for regular follow-up appointments. What side effects may I notice from receiving this medicine? Side effects that you should report to your doctor or health care professional as soon as possible: -allergic reactions like skin rash, itching or hives, swelling of the face, lips, or tongue -anxiety, confusion, or depression -breathing problems -changes in vision -eye pain -feeling faint or lightheaded, falls -jaw pain, especially after dental work -mouth sores -muscle cramps, stiffness, or weakness -redness, blistering, peeling or loosening of the skin, including inside the mouth -trouble passing urine or change in the amount of urine Side effects that usually do not require medical attention (report to your doctor or health care professional if they continue or are bothersome): -bone, joint, or muscle pain -constipation -diarrhea -fever -hair loss -irritation at site where injected -loss of appetite -nausea, vomiting -stomach upset -trouble sleeping -trouble swallowing -weak or tired This list may not describe all possible side effects. Call your doctor for medical advice about side effects. You may report side effects to FDA at 1-800-FDA-1088. Where should I keep my medicine? This drug is given in a hospital or clinic and will not  be stored at home. NOTE: This sheet is a summary. It may not cover all possible information. If you have questions about this medicine, talk to your doctor, pharmacist, or health care provider.    2016, [employer]. (2014-03-01 14:19:39)     

## 2016-07-20 NOTE — Telephone Encounter (Signed)
GAVE PATIENT AVS REPORT AND APPOINTMENTS FOR October AND November. PATIENT ALREADY ON SCHEDULE FOR MONTHLY INJECTIONS AND ONE MONTH F/U. MOVED CT FORM 10/6 TO 10/25 PER 10/3 LOS.

## 2016-07-20 NOTE — Progress Notes (Signed)
Faslodex to be given in [place] today

## 2016-07-22 ENCOUNTER — Ambulatory Visit (HOSPITAL_COMMUNITY): Payer: Managed Care, Other (non HMO)

## 2016-07-26 ENCOUNTER — Other Ambulatory Visit: Payer: Self-pay | Admitting: Hematology and Oncology

## 2016-08-02 ENCOUNTER — Ambulatory Visit (INDEPENDENT_AMBULATORY_CARE_PROVIDER_SITE_OTHER): Payer: Managed Care, Other (non HMO)

## 2016-08-02 ENCOUNTER — Other Ambulatory Visit: Payer: Self-pay

## 2016-08-02 DIAGNOSIS — C50412 Malignant neoplasm of upper-outer quadrant of left female breast: Secondary | ICD-10-CM | POA: Diagnosis not present

## 2016-08-04 ENCOUNTER — Other Ambulatory Visit: Payer: Managed Care, Other (non HMO)

## 2016-08-10 ENCOUNTER — Ambulatory Visit (HOSPITAL_COMMUNITY): Payer: Managed Care, Other (non HMO)

## 2016-08-12 ENCOUNTER — Encounter (HOSPITAL_COMMUNITY): Payer: Managed Care, Other (non HMO)

## 2016-08-15 ENCOUNTER — Ambulatory Visit (HOSPITAL_COMMUNITY)
Admission: RE | Admit: 2016-08-15 | Discharge: 2016-08-15 | Disposition: A | Discharge status: Home / Self Care | Payer: Managed Care, Other (non HMO) | Source: Ambulatory Visit | Attending: Hematology and Oncology | Admitting: Hematology and Oncology

## 2016-08-15 ENCOUNTER — Encounter (HOSPITAL_COMMUNITY): Payer: Self-pay

## 2016-08-15 ENCOUNTER — Encounter (HOSPITAL_COMMUNITY)
Admission: RE | Admit: 2016-08-15 | Discharge: 2016-08-15 | Disposition: A | Discharge status: Home / Self Care | Payer: Managed Care, Other (non HMO) | Source: Ambulatory Visit | Attending: Hematology and Oncology | Admitting: Hematology and Oncology

## 2016-08-15 DIAGNOSIS — C50412 Malignant neoplasm of upper-outer quadrant of left female breast: Secondary | ICD-10-CM | POA: Diagnosis not present

## 2016-08-15 DIAGNOSIS — C7951 Secondary malignant neoplasm of bone: Secondary | ICD-10-CM | POA: Insufficient documentation

## 2016-08-15 DIAGNOSIS — R222 Localized swelling, mass and lump, trunk: Secondary | ICD-10-CM | POA: Diagnosis not present

## 2016-08-15 MED ORDER — IOPAMIDOL (ISOVUE-300) INJECTION 61%
100.0000 mL | Freq: Once | INTRAVENOUS | Status: AC | PRN
  Administered 2016-08-15: 100 mL via INTRAVENOUS

## 2016-08-15 MED ORDER — TECHNETIUM TC 99M MEDRONATE IV KIT
22.3000 | PACK | Freq: Once | INTRAVENOUS | Status: AC | PRN
  Administered 2016-08-15: 22.3 via INTRAVENOUS

## 2016-08-16 NOTE — Assessment & Plan Note (Signed)
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 Was on Arimidex since 08/22/2012 to 03/30/16  Subcutaneous nodule excisionleft chest: Infiltrating carcinoma breast primary, ER positive, PR negative, HER-2 positive  CT CAP and bone scan06/13/2017: Lytic lesions T8 vertebral, T1 posterior element, subcutaneous nodule left lateral chest wall, nonspecific lung nodules; Bone scan: Mets to kull, left humerus, left eighth rib, T7/T8, sternum, left acetabulum. (In retrospect, original breast MRI in 2002 revealed sternal lesions concerning for metastatic disease, PET CT scan did not reveal metastases)  Goals of treatment: Palliation and prolongation of life. Patient understands that stage IV breast cancer cannot be cured but it can be managed with fairly long survivals.  Treatment Plan: Herceptin Q 4 weeks, Lapatinib, Faslodex, Zometa, started 04/28/2016, today is cycle 4  Toxicities: 1. Diarrhea from lapatinib 2. Injection site discomfort from Faslodex Lab work from today was reviewed.  CT CAP: 08/10/2016: Interval response to therapy. Multifocal areas of abnormal sclerosis healing bone mets. Interval decrease in left lateral chest wall mets. Tiny lung nodules are not seen.  Radiology review: Patient Is thrilled to hear the results.  Echocardiogram in [place]   RTC in 8 weeks

## 2016-08-17 ENCOUNTER — Ambulatory Visit: Payer: Managed Care, Other (non HMO)

## 2016-08-17 ENCOUNTER — Ambulatory Visit (HOSPITAL_BASED_OUTPATIENT_CLINIC_OR_DEPARTMENT_OTHER): Payer: Managed Care, Other (non HMO) | Admitting: Hematology and Oncology

## 2016-08-17 ENCOUNTER — Other Ambulatory Visit (HOSPITAL_BASED_OUTPATIENT_CLINIC_OR_DEPARTMENT_OTHER): Payer: Managed Care, Other (non HMO)

## 2016-08-17 ENCOUNTER — Encounter: Payer: Self-pay | Admitting: Hematology and Oncology

## 2016-08-17 ENCOUNTER — Ambulatory Visit (HOSPITAL_BASED_OUTPATIENT_CLINIC_OR_DEPARTMENT_OTHER): Payer: Managed Care, Other (non HMO)

## 2016-08-17 ENCOUNTER — Other Ambulatory Visit: Payer: Self-pay

## 2016-08-17 VITALS — BP 146/81

## 2016-08-17 VITALS — BP 173/93 | HR 76 | Temp 98.2°F | Resp 18 | Wt 164.1 lb

## 2016-08-17 DIAGNOSIS — C50412 Malignant neoplasm of upper-outer quadrant of left female breast: Secondary | ICD-10-CM

## 2016-08-17 DIAGNOSIS — R911 Solitary pulmonary nodule: Secondary | ICD-10-CM

## 2016-08-17 DIAGNOSIS — Z5111 Encounter for antineoplastic chemotherapy: Secondary | ICD-10-CM | POA: Diagnosis not present

## 2016-08-17 DIAGNOSIS — C7951 Secondary malignant neoplasm of bone: Secondary | ICD-10-CM

## 2016-08-17 DIAGNOSIS — Z170 Estrogen receptor positive status [ER+]: Secondary | ICD-10-CM

## 2016-08-17 DIAGNOSIS — Z5112 Encounter for antineoplastic immunotherapy: Secondary | ICD-10-CM

## 2016-08-17 LAB — CBC WITH DIFFERENTIAL/PLATELET
BASO%: 1 % (ref 0.0–2.0)
Basophils Absolute: 0.1 10*3/uL (ref 0.0–0.1)
EOS%: 1.2 % (ref 0.0–7.0)
Eosinophils Absolute: 0.1 10*3/uL (ref 0.0–0.5)
HCT: 38.2 % (ref 34.8–46.6)
HGB: 12.7 g/dL (ref 11.6–15.9)
LYMPH%: 37.9 % (ref 14.0–49.7)
MCH: 29.8 pg (ref 25.1–34.0)
MCHC: 33.3 g/dL (ref 31.5–36.0)
MCV: 89.6 fL (ref 79.5–101.0)
MONO#: 0.6 10*3/uL (ref 0.1–0.9)
MONO%: 10.5 % (ref 0.0–14.0)
NEUT#: 2.7 10*3/uL (ref 1.5–6.5)
NEUT%: 49.4 % (ref 38.4–76.8)
Platelets: 237 10*3/uL (ref 145–400)
RBC: 4.27 10*6/uL (ref 3.70–5.45)
RDW: 13.4 % (ref 11.2–14.5)
WBC: 5.4 10*3/uL (ref 3.9–10.3)
lymph#: 2 10*3/uL (ref 0.9–3.3)

## 2016-08-17 LAB — COMPREHENSIVE METABOLIC PANEL
ALT: 37 U/L (ref 0–55)
AST: 35 U/L — ABNORMAL HIGH (ref 5–34)
Albumin: 3.7 g/dL (ref 3.5–5.0)
Alkaline Phosphatase: 104 U/L (ref 40–150)
Anion Gap: 9 mEq/L (ref 3–11)
BUN: 18.6 mg/dL (ref 7.0–26.0)
CO2: 27 mEq/L (ref 22–29)
Calcium: 8.9 mg/dL (ref 8.4–10.4)
Chloride: 106 mEq/L (ref 98–109)
Creatinine: 0.7 mg/dL (ref 0.6–1.1)
EGFR: >90 mL/min/{1.73_m2} (ref >90)
Glucose: 99 mg/dl (ref 70–140)
Potassium: 3.2 mEq/L — ABNORMAL LOW (ref 3.5–5.1)
Sodium: 143 mEq/L (ref 136–145)
Total Bilirubin: 0.48 mg/dL (ref 0.20–1.20)
Total Protein: 7 g/dL (ref 6.4–8.3)

## 2016-08-17 MED ORDER — TRASTUZUMAB CHEMO 150 MG IV SOLR
6.0000 mg/kg | Freq: Once | INTRAVENOUS | Status: AC
  Administered 2016-08-17: 462 mg via INTRAVENOUS
  Filled 2016-08-17: qty 22

## 2016-08-17 MED ORDER — DIPHENHYDRAMINE HCL 25 MG PO CAPS
ORAL_CAPSULE | ORAL | Status: AC
  Filled 2016-08-17: qty 2

## 2016-08-17 MED ORDER — ACETAMINOPHEN 325 MG PO TABS
650.0000 mg | ORAL_TABLET | Freq: Once | ORAL | Status: AC
  Administered 2016-08-17: 650 mg via ORAL

## 2016-08-17 MED ORDER — FULVESTRANT 250 MG/5ML IM SOLN
500.0000 mg | INTRAMUSCULAR | Status: DC
Start: 2016-08-17 — End: 2016-08-17
  Administered 2016-08-17: 500 mg via INTRAMUSCULAR
  Filled 2016-08-17: qty 10

## 2016-08-17 MED ORDER — ACETAMINOPHEN 325 MG PO TABS
ORAL_TABLET | ORAL | Status: AC
  Filled 2016-08-17: qty 2

## 2016-08-17 MED ORDER — SODIUM CHLORIDE 0.9% FLUSH
10.0000 mL | INTRAVENOUS | Status: DC | PRN
  Administered 2016-08-17: 10 mL
  Filled 2016-08-17: qty 10

## 2016-08-17 MED ORDER — ZOLEDRONIC ACID 4 MG/100ML IV SOLN
4.0000 mg | Freq: Once | INTRAVENOUS | Status: AC
  Administered 2016-08-17: 4 mg via INTRAVENOUS
  Filled 2016-08-17: qty 100

## 2016-08-17 MED ORDER — HEPARIN SOD (PORK) LOCK FLUSH 100 UNIT/ML IV SOLN
500.0000 [IU] | Freq: Once | INTRAVENOUS | Status: AC | PRN
  Administered 2016-08-17: 500 [IU]
  Filled 2016-08-17: qty 5

## 2016-08-17 MED ORDER — SODIUM CHLORIDE 0.9 % IV SOLN
Freq: Once | INTRAVENOUS | Status: AC
  Administered 2016-08-17: 10:00:00 via INTRAVENOUS

## 2016-08-17 MED ORDER — DIPHENHYDRAMINE HCL 25 MG PO CAPS
50.0000 mg | ORAL_CAPSULE | Freq: Once | ORAL | Status: AC
  Administered 2016-08-17: 50 mg via ORAL

## 2016-08-17 MED ORDER — POTASSIUM CHLORIDE CRYS ER 20 MEQ PO TBCR: 20.0000 meq | EXTENDED_RELEASE_TABLET | Freq: Every day | ORAL | 0 refills | Status: DC

## 2016-08-17 NOTE — Patient Instructions (Signed)
C  Fulvestrant injection What is this medicine? FULVESTRANT (ful VES trant) blocks the effects of estrogen. It is used to treat breast cancer. This medicine may be used for other purposes; ask your health care provider or pharmacist if you have questions. What should I tell my health care provider before I take this medicine? They need to know if you have any of these conditions: -bleeding problems -liver disease -low levels of platelets in the blood -an unusual or allergic reaction to fulvestrant, other medicines, foods, dyes, or preservatives -pregnant or trying to get pregnant -breast-feeding How should I use this medicine? This medicine is for injection into a muscle. It is usually given by a health care professional in a hospital or clinic setting. Talk to your pediatrician regarding the use of this medicine in children. Special care may be needed. Overdosage: If you think you have taken too much of this medicine contact a poison control center or emergency room at once. NOTE: This medicine is only for you. Do not share this medicine with others. What if I miss a dose? It is important not to miss your dose. Call your doctor or health care professional if you are unable to keep an appointment. What may interact with this medicine? -medicines that treat or prevent blood clots like warfarin, enoxaparin, and dalteparin This list may not describe all possible interactions. Give your health care provider a list of all the medicines, herbs, non-prescription drugs, or dietary supplements you use. Also tell them if you smoke, drink alcohol, or use illegal drugs. Some items may interact with your medicine. What should I watch for while using this medicine? Your condition will be monitored carefully while you are receiving this medicine. You will need important blood work done while you are taking this medicine. Do not become pregnant while taking this medicine or for at least 1 year after  stopping it. Women of child-bearing potential will need to have a negative pregnancy test before starting this medicine. Women should inform their doctor if they wish to become pregnant or think they might be pregnant. There is a potential for serious side effects to an unborn child. Men should inform their doctors if they wish to father a child. This medicine may lower sperm counts. Talk to your health care professional or pharmacist for more information. Do not breast-feed an infant while taking this medicine or for 1 year after the last dose. What side effects may I notice from receiving this medicine? Side effects that you should report to your doctor or health care professional as soon as possible: -allergic reactions like skin rash, itching or hives, swelling of the face, lips, or tongue -feeling faint or lightheaded, falls -pain, tingling, numbness, or weakness in the legs -signs and symptoms of infection like fever or chills; cough; flu-like symptoms; sore throat -vaginal bleeding Side effects that usually do not require medical attention (report to your doctor or health care professional if they continue or are bothersome): -aches, pains -constipation -diarrhea -headache -hot flashes -nausea, vomiting -pain at site where injected -stomach pain This list may not describe all possible side effects. Call your doctor for medical advice about side effects. You may report side effects to FDA at 1-800-FDA-1088. Where should I keep my medicine? This drug is given in a hospital or clinic and will not be stored at home. NOTE: This sheet is a summary. It may not cover all possible information. If you have questions about this medicine, talk to your doctor, pharmacist,  or health care provider.    2016, Elsevier/Gold Standard. (2015-05-01 11:03:55) Zoledronic Acid injection (Hypercalcemia, Oncology) What is this medicine? ZOLEDRONIC ACID (ZOE le dron ik AS id) lowers the amount of calcium loss  from bone. It is used to treat too much calcium in your blood from cancer. It is also used to prevent complications of cancer that has spread to the bone. This medicine may be used for other purposes; ask your health care provider or pharmacist if you have questions. What should I tell my health care provider before I take this medicine? They need to know if you have any of these conditions: -aspirin-sensitive asthma -cancer, especially if you are receiving medicines used to treat cancer -dental disease or wear dentures -infection -kidney disease -receiving corticosteroids like dexamethasone or prednisone -an unusual or allergic reaction to zoledronic acid, other medicines, foods, dyes, or preservatives -pregnant or trying to get pregnant -breast-feeding How should I use this medicine? This medicine is for infusion into a vein. It is given by a health care professional in a hospital or clinic setting. Talk to your pediatrician regarding the use of this medicine in children. Special care may be needed. Overdosage: If you think you have taken too much of this medicine contact a poison control center or emergency room at once. NOTE: This medicine is only for you. Do not share this medicine with others. What if I miss a dose? It is important not to miss your dose. Call your doctor or health care professional if you are unable to keep an appointment. What may interact with this medicine? -certain antibiotics given by injection -NSAIDs, medicines for pain and inflammation, like ibuprofen or naproxen -some diuretics like bumetanide, furosemide -teriparatide -thalidomide This list may not describe all possible interactions. Give your health care provider a list of all the medicines, herbs, non-prescription drugs, or dietary supplements you use. Also tell them if you smoke, drink alcohol, or use illegal drugs. Some items may interact with your medicine. What should I watch for while using this  medicine? Visit your doctor or health care professional for regular checkups. It may be some time before you see the benefit from this medicine. Do not stop taking your medicine unless your doctor tells you to. Your doctor may order blood tests or other tests to see how you are doing. Women should inform their doctor if they wish to become pregnant or think they might be pregnant. There is a potential for serious side effects to an unborn child. Talk to your health care professional or pharmacist for more information. You should make sure that you get enough calcium and vitamin D while you are taking this medicine. Discuss the foods you eat and the vitamins you take with your health care professional. Some people who take this medicine have severe bone, joint, and/or muscle pain. This medicine may also increase your risk for jaw problems or a broken thigh bone. Tell your doctor right away if you have severe pain in your jaw, bones, joints, or muscles. Tell your doctor if you have any pain that does not go away or that gets worse. Tell your dentist and dental surgeon that you are taking this medicine. You should not have major dental surgery while on this medicine. See your dentist to have a dental exam and fix any dental problems before starting this medicine. Take good care of your teeth while on this medicine. Make sure you see your dentist for regular follow-up appointments. What side effects may  I notice from receiving this medicine? Side effects that you should report to your doctor or health care professional as soon as possible: -allergic reactions like skin rash, itching or hives, swelling of the face, lips, or tongue -anxiety, confusion, or depression -breathing problems -changes in vision -eye pain -feeling faint or lightheaded, falls -jaw pain, especially after dental work -mouth sores -muscle cramps, stiffness, or weakness -redness, blistering, peeling or loosening of the skin, including  inside the mouth -trouble passing urine or change in the amount of urine Side effects that usually do not require medical attention (report to your doctor or health care professional if they continue or are bothersome): -bone, joint, or muscle pain -constipation -diarrhea -fever -hair loss -irritation at site where injected -loss of appetite -nausea, vomiting -stomach upset -trouble sleeping -trouble swallowing -weak or tired This list may not describe all possible side effects. Call your doctor for medical advice about side effects. You may report side effects to FDA at 1-800-FDA-1088. Where should I keep my medicine? This drug is given in a hospital or clinic and will not be stored at home. NOTE: This sheet is a summary. It may not cover all possible information. If you have questions about this medicine, talk to your doctor, pharmacist, or health care provider.    2016, Elsevier/Gold Standard. (2014-03-01 14:19:39)

## 2016-08-17 NOTE — Progress Notes (Signed)
Per Dr. Lindi Adie, patient to begin potassium 20 meq daily d/t K-3.2 today.  Patient notified and prescription sent to [facility] in [place] by C. Child psychotherapist.

## 2016-08-17 NOTE — Progress Notes (Signed)
Pt's K+ found to be 3.2 today.  Per Dr. Lindi Adie pt is to start taking 20 mEq K+ PO daily until next follow up lab work.  Prescription sent to requested pharmacy.  [name], RN in infusion room notified who communicated directions to pt.  No further questions or concerns at this time.

## 2016-08-17 NOTE — Progress Notes (Signed)
Patient Care Team: [name], MD as PCP - General (Family Medicine)  DIAGNOSIS:  Encounter Diagnoses  Name Primary?  . 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 Malignant neoplasm of upper-outer quadrant of left breast in female, estrogen receptor positive ([place])     SUMMARY OF ONCOLOGIC HISTORY:   Breast cancer of upper-outer quadrant of left female breast ([place])   11/14/2011 Surgery    Bilateral mastectomy, prophylactic on the right, left breast IDC 3/18 lymph nodes positive with extracapsular extension ER 89%, PR 81%, HER-2 negative, Ki-67 79% T2 N1 A. stage IIB      12/13/2011 - 06/28/2012 Chemotherapy    4 cycles of FEC followed by 4 cycles of Taxotere      07/17/2012 - 08/22/2012 Radiation Therapy    Adjuvant radiation therapy      08/22/2012 - 03/16/2016 Anti-estrogen oral therapy    Arimidex 1 mg daily      03/16/2016 Relapse/Recurrence    Subcutaneous nodule excision left chest: Infiltrating carcinoma breast primary, ER positive, PR negative      03/29/2016 Imaging    CT CAP and bone scan: Lytic lesions T8 vertebral, T1 posterior element, subcutaneous nodule left lateral chest wall, nonspecific lung nodules; Bone scan: Mets to kull, left humerus, left eighth rib, T7/T8, sternum, left acetabulum      04/28/2016 -  Chemotherapy    Herceptin, lapatinib, Faslodex, Zometa every 4 weeks       CHIEF COMPLIANT: Follow-up of metastatic breast cancer, recent scans, cycle 5 Herceptin and Faslodex  INTERVAL HISTORY: [name] Riley is a 65 year old with above-mentioned history metastatic breast cancer with subcutaneous nodules on the chest wall in addition to bone metastases and lung nodules. She is currently on Herceptin and lapatinib Faslodex and Zometa. She is tolerating the treatment fairly well except for intermittent diarrhea related to lapatinib. She had recent CT scans and is here to discuss the results.  REVIEW OF SYSTEMS:   Constitutional: Denies fevers, chills or abnormal  weight loss Eyes: Denies blurriness of vision Ears, nose, mouth, throat, and face: Denies mucositis or sore throat Respiratory: Denies cough, dyspnea or wheezes Cardiovascular: Denies palpitation, chest discomfort Gastrointestinal: Intermittent diarrhea Skin: Denies abnormal skin rashes Lymphatics: Denies new lymphadenopathy or easy bruising Neurological:Denies numbness, tingling or new weaknesses Behavioral/Psych: Mood is stable, no new changes  Extremities: No lower extremity edema Breast: Subcutaneous nodules in the chest wall have improved All other systems were reviewed with the patient and are negative.  I have reviewed the past medical history, past surgical history, social history and family history with the patient and they are unchanged from previous note.  ALLERGIES:  is allergic to contrast media [iodinated diagnostic agents] and food.  MEDICATIONS:  Current Outpatient Prescriptions  Medication Sig Dispense Refill  . acetaminophen (TYLENOL) 500 MG tablet Take 1,000 mg by mouth every 6 (six) hours as needed for moderate pain.    Marland Kitchen aspirin 81 MG tablet Take 81 mg by mouth daily.      . Cholecalciferol (VITAMIN D-3 PO) Take 2,000 Int'l Units by mouth daily.    Marland Kitchen doxycycline (VIBRAMYCIN) 100 MG capsule Take 1 capsule (100 mg total) by mouth 2 (two) times daily. 20 capsule 0  . levothyroxine (SYNTHROID, LEVOTHROID) 75 MCG tablet Take 75 mcg by mouth daily.     Marland Kitchen lidocaine-prilocaine (EMLA) cream Apply to affected area once 30 g 3  . metoprolol succinate (TOPROL-XL) 100 MG 24 hr tablet Take 1 tablet (100 mg total) by mouth daily.  90 tablet 3  . Multiple Vitamins-Minerals (MULTIVITAMIN WITH MINERALS) tablet Take 1 tablet by mouth daily.      . predniSONE (DELTASONE) 50 MG tablet Take 1 tablet 13, 7, and 1 hour prior to CT scan. 5 tablet 0  . TYKERB 250 MG tablet TAKE 5 TABLETS (1250MG TOTAL) BY MOUTH DAILY  150 tablet 1   No current facility-administered medications for this  visit.     PHYSICAL EXAMINATION: ECOG PERFORMANCE STATUS: 1 - Symptomatic but completely ambulatory  Vitals:   08/17/16 0853  BP: (!) 173/93  Pulse: 76  Resp: 18  Temp: 98.2 F (36.8 C)   Filed Weights   08/17/16 0853  Weight: 164 lb 1.6 oz (74.4 kg)    GENERAL:alert, no distress and comfortable SKIN: skin color, texture, turgor are normal, no rashes or significant lesions EYES: normal, Conjunctiva are pink and non-injected, sclera clear OROPHARYNX:no exudate, no erythema and lips, buccal mucosa, and tongue normal  NECK: supple, thyroid normal size, non-tender, without nodularity LYMPH:  no palpable lymphadenopathy in the cervical, axillary or inguinal LUNGS: clear to auscultation and percussion with normal breathing effort HEART: regular rate & rhythm and no murmurs and no lower extremity edema ABDOMEN:abdomen soft, non-tender and normal bowel sounds MUSCULOSKELETAL:no cyanosis of digits and no clubbing  NEURO: alert & oriented x 3 with fluent speech, no focal motor/sensory deficits EXTREMITIES: No lower extremity edema  LABORATORY DATA:  I have reviewed the data as listed   Chemistry      Component Value Date/Time   NA 143 08/17/2016 0832   K 3.2 (L) 08/17/2016 0832   CL 106 07/03/2016 0939   CL 105 08/10/2012 0908   CO2 27 08/17/2016 0832   BUN 18.6 08/17/2016 0832   CREATININE 0.7 08/17/2016 0832      Component Value Date/Time   CALCIUM 8.9 08/17/2016 0832   ALKPHOS 104 08/17/2016 0832   AST 35 (H) 08/17/2016 0832   ALT 37 08/17/2016 0832   BILITOT 0.48 08/17/2016 0832       Lab Results  Component Value Date   WBC 5.4 08/17/2016   HGB 12.7 08/17/2016   HCT 38.2 08/17/2016   MCV 89.6 08/17/2016   PLT 237 08/17/2016   NEUTROABS 2.7 08/17/2016     ASSESSMENT & PLAN:  Breast cancer of upper-outer quadrant of left female breast ([place]) Left breast invasive ductal carcinomaT2, N1, M0 stage IIB 3 of 18 lymph nodes positive with extracapsular extension  ER 89% PR 81% HER-2 negative Ki-67 79% status post 4 cycles of FEC and 4 cycles of Taxotere and adjuvant radiation. Was on Arimidex since 08/22/2012 to 03/30/16  Subcutaneous nodule excisionleft chest: Infiltrating carcinoma breast primary, ER positive, PR negative, HER-2 positive  CT CAP and bone scan06/13/2017: Lytic lesions T8 vertebral, T1 posterior element, subcutaneous nodule left lateral chest wall, nonspecific lung nodules; Bone scan: Mets to kull, left humerus, left eighth rib, T7/T8, sternum, left acetabulum. (In retrospect, original breast MRI in 2002 revealed sternal lesions concerning for metastatic disease, PET CT scan did not reveal metastases)  Goals of treatment: Palliation and prolongation of life. Patient understands that stage IV breast cancer cannot be cured but it can be managed with fairly long survivals.  Treatment Plan: Herceptin Q 4 weeks, Lapatinib, Faslodex, Zometa, started 04/28/2016, today is cycle 5  Toxicities: 1. Diarrhea from lapatinib 2. Injection site discomfort from Faslodex Lab work from today was reviewed.  CT CAP: 08/10/2016: Interval response to therapy. Multifocal areas of abnormal sclerosis healing  bone mets. Interval decrease in left lateral chest wall mets. Tiny lung nodules are not seen.  Radiology review: Patient Is thrilled to hear the results.  Echocardiogram in [facility] October 2017 showed EF 55-60%  RTC in 8 weeks   No orders of the defined types were placed in this encounter.  The patient has a good understanding of the overall plan. she agrees with it. she will call with any problems that may develop before the next visit here.   Rulon Eisenmenger, MD 08/17/16

## 2016-08-19 ENCOUNTER — Other Ambulatory Visit: Payer: Self-pay | Admitting: *Deleted

## 2016-08-19 MED ORDER — METOPROLOL SUCCINATE ER 100 MG PO TB24: 100.0000 mg | ORAL_TABLET | Freq: Every day | ORAL | 3 refills | Status: DC

## 2016-08-24 ENCOUNTER — Encounter: Payer: Self-pay | Admitting: Gastroenterology

## 2016-09-14 ENCOUNTER — Other Ambulatory Visit (HOSPITAL_BASED_OUTPATIENT_CLINIC_OR_DEPARTMENT_OTHER): Payer: Managed Care, Other (non HMO)

## 2016-09-14 ENCOUNTER — Ambulatory Visit: Payer: Managed Care, Other (non HMO)

## 2016-09-14 ENCOUNTER — Ambulatory Visit (HOSPITAL_BASED_OUTPATIENT_CLINIC_OR_DEPARTMENT_OTHER): Payer: Managed Care, Other (non HMO)

## 2016-09-14 VITALS — BP 163/85 | HR 74 | Temp 98.4°F | Resp 20

## 2016-09-14 DIAGNOSIS — C50412 Malignant neoplasm of upper-outer quadrant of left female breast: Secondary | ICD-10-CM

## 2016-09-14 DIAGNOSIS — Z5111 Encounter for antineoplastic chemotherapy: Secondary | ICD-10-CM

## 2016-09-14 DIAGNOSIS — Z5112 Encounter for antineoplastic immunotherapy: Secondary | ICD-10-CM

## 2016-09-14 DIAGNOSIS — Z170 Estrogen receptor positive status [ER+]: Secondary | ICD-10-CM

## 2016-09-14 DIAGNOSIS — C7951 Secondary malignant neoplasm of bone: Secondary | ICD-10-CM | POA: Diagnosis not present

## 2016-09-14 LAB — CBC WITH DIFFERENTIAL/PLATELET
BASO%: 0.5 % (ref 0.0–2.0)
Basophils Absolute: 0 10*3/uL (ref 0.0–0.1)
EOS%: 4.3 % (ref 0.0–7.0)
Eosinophils Absolute: 0.2 10*3/uL (ref 0.0–0.5)
HCT: 38.3 % (ref 34.8–46.6)
HGB: 12.9 g/dL (ref 11.6–15.9)
LYMPH%: 36.6 % (ref 14.0–49.7)
MCH: 29.8 pg (ref 25.1–34.0)
MCHC: 33.6 g/dL (ref 31.5–36.0)
MCV: 88.7 fL (ref 79.5–101.0)
MONO#: 0.5 10*3/uL (ref 0.1–0.9)
MONO%: 8.6 % (ref 0.0–14.0)
NEUT#: 2.8 10*3/uL (ref 1.5–6.5)
NEUT%: 50 % (ref 38.4–76.8)
Platelets: 246 10*3/uL (ref 145–400)
RBC: 4.32 10*6/uL (ref 3.70–5.45)
RDW: 13.6 % (ref 11.2–14.5)
WBC: 5.6 10*3/uL (ref 3.9–10.3)
lymph#: 2 10*3/uL (ref 0.9–3.3)

## 2016-09-14 LAB — COMPREHENSIVE METABOLIC PANEL
ALT: 30 U/L (ref 0–55)
AST: 30 U/L (ref 5–34)
Albumin: 3.7 g/dL (ref 3.5–5.0)
Alkaline Phosphatase: 103 U/L (ref 40–150)
Anion Gap: 8 mEq/L (ref 3–11)
BUN: 13.6 mg/dL (ref 7.0–26.0)
CO2: 26 mEq/L (ref 22–29)
Calcium: 9.7 mg/dL (ref 8.4–10.4)
Chloride: 106 mEq/L (ref 98–109)
Creatinine: 0.7 mg/dL (ref 0.6–1.1)
EGFR: >90 mL/min/{1.73_m2} (ref >90)
Glucose: 103 mg/dl (ref 70–140)
Potassium: 3.6 mEq/L (ref 3.5–5.1)
Sodium: 140 mEq/L (ref 136–145)
Total Bilirubin: 0.51 mg/dL (ref 0.20–1.20)
Total Protein: 7 g/dL (ref 6.4–8.3)

## 2016-09-14 MED ORDER — SODIUM CHLORIDE 0.9 % IV SOLN: Freq: Once | INTRAVENOUS | Status: AC

## 2016-09-14 MED ORDER — SODIUM CHLORIDE 0.9% FLUSH
10.0000 mL | INTRAVENOUS | Status: DC | PRN
Start: 2016-09-14 — End: 2016-09-14
  Administered 2016-09-14: 10 mL
  Filled 2016-09-14: qty 10

## 2016-09-14 MED ORDER — HEPARIN SOD (PORK) LOCK FLUSH 100 UNIT/ML IV SOLN
500.0000 [IU] | Freq: Once | INTRAVENOUS | Status: AC | PRN
  Administered 2016-09-14: 500 [IU]
  Filled 2016-09-14: qty 5

## 2016-09-14 MED ORDER — SODIUM CHLORIDE 0.9 % IV SOLN
Freq: Once | INTRAVENOUS | Status: AC
  Administered 2016-09-14: 10:00:00 via INTRAVENOUS

## 2016-09-14 MED ORDER — HEPARIN SOD (PORK) LOCK FLUSH 100 UNIT/ML IV SOLN
250.0000 [IU] | Freq: Once | INTRAVENOUS | Status: DC | PRN
  Filled 2016-09-14: qty 5

## 2016-09-14 MED ORDER — DIPHENHYDRAMINE HCL 25 MG PO CAPS
ORAL_CAPSULE | ORAL | Status: AC
  Filled 2016-09-14: qty 2

## 2016-09-14 MED ORDER — ALTEPLASE 2 MG IJ SOLR
2.0000 mg | Freq: Once | INTRAMUSCULAR | Status: DC | PRN
  Filled 2016-09-14: qty 2

## 2016-09-14 MED ORDER — SODIUM CHLORIDE 0.9% FLUSH
3.0000 mL | Freq: Once | INTRAVENOUS | Status: DC | PRN
  Filled 2016-09-14: qty 10

## 2016-09-14 MED ORDER — SODIUM CHLORIDE 0.9 % IV SOLN
6.0000 mg/kg | Freq: Once | INTRAVENOUS | Status: AC
  Administered 2016-09-14: 462 mg via INTRAVENOUS
  Filled 2016-09-14: qty 22

## 2016-09-14 MED ORDER — ACETAMINOPHEN 325 MG PO TABS
650.0000 mg | ORAL_TABLET | Freq: Once | ORAL | Status: AC
  Administered 2016-09-14: 650 mg via ORAL

## 2016-09-14 MED ORDER — ZOLEDRONIC ACID 4 MG/100ML IV SOLN
4.0000 mg | Freq: Once | INTRAVENOUS | Status: AC
  Administered 2016-09-14: 4 mg via INTRAVENOUS
  Filled 2016-09-14: qty 100

## 2016-09-14 MED ORDER — FULVESTRANT 250 MG/5ML IM SOLN
500.0000 mg | INTRAMUSCULAR | Status: DC
  Administered 2016-09-14: 500 mg via INTRAMUSCULAR
  Filled 2016-09-14: qty 10

## 2016-09-14 MED ORDER — ACETAMINOPHEN 325 MG PO TABS
ORAL_TABLET | ORAL | Status: AC
  Filled 2016-09-14: qty 2

## 2016-09-14 MED ORDER — DIPHENHYDRAMINE HCL 25 MG PO CAPS
50.0000 mg | ORAL_CAPSULE | Freq: Once | ORAL | Status: AC
  Administered 2016-09-14: 50 mg via ORAL

## 2016-09-14 NOTE — Patient Instructions (Addendum)
[facility] Discharge Instructions for Patients Receiving Chemotherapy  Today you received the following chemotherapy agents:  Herceptin  To help prevent nausea and vomiting after your treatment, we encourage you to take your nausea medication as ordered per MD.   If you develop nausea and vomiting that is not controlled by your nausea medication, call the clinic.   BELOW ARE SYMPTOMS THAT SHOULD BE REPORTED IMMEDIATELY:  *FEVER GREATER THAN 100.5 F  *CHILLS WITH OR WITHOUT FEVER  NAUSEA AND VOMITING THAT IS NOT CONTROLLED WITH YOUR NAUSEA MEDICATION  *UNUSUAL SHORTNESS OF BREATH  *UNUSUAL BRUISING OR BLEEDING  TENDERNESS IN MOUTH AND THROAT WITH OR WITHOUT PRESENCE OF ULCERS  *URINARY PROBLEMS  *BOWEL PROBLEMS  UNUSUAL RASH Items with * indicate a potential emergency and should be followed up as soon as possible.  Feel free to call the clinic you have any questions or concerns. The clinic phone number is [phone].  Please show the [facility] at check-in to the Emergency Department and triage nurse.  Zoledronic Acid injection (Hypercalcemia, Oncology) What is this medicine? ZOLEDRONIC ACID (ZOE le dron ik AS id) lowers the amount of calcium loss from bone. It is used to treat too much calcium in your blood from cancer. It is also used to prevent complications of cancer that has spread to the bone. This medicine may be used for other purposes; ask your health care provider or pharmacist if you have questions. COMMON BRAND NAME(S): Zometa What should I tell my health care provider before I take this medicine? They need to know if you have any of these conditions: -aspirin-sensitive asthma -cancer, especially if you are receiving medicines used to treat cancer -dental disease or wear dentures -infection -kidney disease -receiving corticosteroids like dexamethasone or prednisone -an unusual or allergic reaction to zoledronic acid, other medicines,  foods, dyes, or preservatives -pregnant or trying to get pregnant -breast-feeding How should I use this medicine? This medicine is for infusion into a vein. It is given by a health care professional in a hospital or clinic setting. Talk to your pediatrician regarding the use of this medicine in children. Special care may be needed. Overdosage: If you think you have taken too much of this medicine contact a poison control center or emergency room at once. NOTE: This medicine is only for you. Do not share this medicine with others. What if I miss a dose? It is important not to miss your dose. Call your doctor or health care professional if you are unable to keep an appointment. What may interact with this medicine? -certain antibiotics given by injection -NSAIDs, medicines for pain and inflammation, like ibuprofen or naproxen -some diuretics like bumetanide, furosemide -teriparatide -thalidomide This list may not describe all possible interactions. Give your health care provider a list of all the medicines, herbs, non-prescription drugs, or dietary supplements you use. Also tell them if you smoke, drink alcohol, or use illegal drugs. Some items may interact with your medicine. What should I watch for while using this medicine? Visit your doctor or health care professional for regular checkups. It may be some time before you see the benefit from this medicine. Do not stop taking your medicine unless your doctor tells you to. Your doctor may order blood tests or other tests to see how you are doing. Women should inform their doctor if they wish to become pregnant or think they might be pregnant. There is a potential for serious side effects to an unborn child.  Talk to your health care professional or pharmacist for more information. You should make sure that you get enough calcium and vitamin D while you are taking this medicine. Discuss the foods you eat and the vitamins you take with your health  care professional. Some people who take this medicine have severe bone, joint, and/or muscle pain. This medicine may also increase your risk for jaw problems or a broken thigh bone. Tell your doctor right away if you have severe pain in your jaw, bones, joints, or muscles. Tell your doctor if you have any pain that does not go away or that gets worse. Tell your dentist and dental surgeon that you are taking this medicine. You should not have major dental surgery while on this medicine. See your dentist to have a dental exam and fix any dental problems before starting this medicine. Take good care of your teeth while on this medicine. Make sure you see your dentist for regular follow-up appointments. What side effects may I notice from receiving this medicine? Side effects that you should report to your doctor or health care professional as soon as possible: -allergic reactions like skin rash, itching or hives, swelling of the face, lips, or tongue -anxiety, confusion, or depression -breathing problems -changes in vision -eye pain -feeling faint or lightheaded, falls -jaw pain, especially after dental work -mouth sores -muscle cramps, stiffness, or weakness -redness, blistering, peeling or loosening of the skin, including inside the mouth -trouble passing urine or change in the amount of urine Side effects that usually do not require medical attention (report to your doctor or health care professional if they continue or are bothersome): -bone, joint, or muscle pain -constipation -diarrhea -fever -hair loss -irritation at site where injected -loss of appetite -nausea, vomiting -stomach upset -trouble sleeping -trouble swallowing -weak or tired This list may not describe all possible side effects. Call your doctor for medical advice about side effects. You may report side effects to FDA at 1-800-FDA-1088. Where should I keep my medicine? This drug is given in a hospital or clinic and  will not be stored at home. NOTE: This sheet is a summary. It may not cover all possible information. If you have questions about this medicine, talk to your doctor, pharmacist, or health care provider.  2017 Elsevier/Gold Standard (2014-03-01 14:19:39)

## 2016-10-12 ENCOUNTER — Encounter: Payer: Managed Care, Other (non HMO) | Admitting: Gastroenterology

## 2016-10-12 ENCOUNTER — Ambulatory Visit (HOSPITAL_BASED_OUTPATIENT_CLINIC_OR_DEPARTMENT_OTHER): Payer: Managed Care, Other (non HMO)

## 2016-10-12 ENCOUNTER — Ambulatory Visit: Payer: Managed Care, Other (non HMO)

## 2016-10-12 ENCOUNTER — Other Ambulatory Visit (HOSPITAL_BASED_OUTPATIENT_CLINIC_OR_DEPARTMENT_OTHER): Payer: Managed Care, Other (non HMO)

## 2016-10-12 VITALS — BP 145/78 | HR 77 | Temp 98.4°F | Resp 18

## 2016-10-12 DIAGNOSIS — Z170 Estrogen receptor positive status [ER+]: Secondary | ICD-10-CM

## 2016-10-12 DIAGNOSIS — C7951 Secondary malignant neoplasm of bone: Secondary | ICD-10-CM

## 2016-10-12 DIAGNOSIS — Z5111 Encounter for antineoplastic chemotherapy: Secondary | ICD-10-CM | POA: Diagnosis not present

## 2016-10-12 DIAGNOSIS — C50412 Malignant neoplasm of upper-outer quadrant of left female breast: Secondary | ICD-10-CM

## 2016-10-12 LAB — CBC WITH DIFFERENTIAL/PLATELET
BASO%: 0.4 % (ref 0.0–2.0)
Basophils Absolute: 0 10*3/uL (ref 0.0–0.1)
EOS%: 2.2 % (ref 0.0–7.0)
Eosinophils Absolute: 0.1 10*3/uL (ref 0.0–0.5)
HCT: 37.8 % (ref 34.8–46.6)
HGB: 12.5 g/dL (ref 11.6–15.9)
LYMPH%: 33.3 % (ref 14.0–49.7)
MCH: 29.8 pg (ref 25.1–34.0)
MCHC: 33.1 g/dL (ref 31.5–36.0)
MCV: 90 fL (ref 79.5–101.0)
MONO#: 0.5 10*3/uL (ref 0.1–0.9)
MONO%: 10.1 % (ref 0.0–14.0)
NEUT#: 2.7 10*3/uL (ref 1.5–6.5)
NEUT%: 54 % (ref 38.4–76.8)
Platelets: 246 10*3/uL (ref 145–400)
RBC: 4.2 10*6/uL (ref 3.70–5.45)
RDW: 13.7 % (ref 11.2–14.5)
WBC: 5 10*3/uL (ref 3.9–10.3)
lymph#: 1.7 10*3/uL (ref 0.9–3.3)

## 2016-10-12 LAB — COMPREHENSIVE METABOLIC PANEL
ALT: 28 U/L (ref 0–55)
AST: 28 U/L (ref 5–34)
Albumin: 4 g/dL (ref 3.5–5.0)
Alkaline Phosphatase: 98 U/L (ref 40–150)
Anion Gap: 10 mEq/L (ref 3–11)
BUN: 12.6 mg/dL (ref 7.0–26.0)
CO2: 25 mEq/L (ref 22–29)
Calcium: 9.3 mg/dL (ref 8.4–10.4)
Chloride: 106 mEq/L (ref 98–109)
Creatinine: 0.7 mg/dL (ref 0.6–1.1)
EGFR: >90 mL/min/{1.73_m2} (ref >90)
Glucose: 105 mg/dl (ref 70–140)
Potassium: 3.8 mEq/L (ref 3.5–5.1)
Sodium: 141 mEq/L (ref 136–145)
Total Bilirubin: 0.63 mg/dL (ref 0.20–1.20)
Total Protein: 7.2 g/dL (ref 6.4–8.3)

## 2016-10-12 MED ORDER — SODIUM CHLORIDE 0.9% FLUSH
3.0000 mL | Freq: Once | INTRAVENOUS | Status: DC | PRN
  Filled 2016-10-12: qty 10

## 2016-10-12 MED ORDER — SODIUM CHLORIDE 0.9% FLUSH
10.0000 mL | INTRAVENOUS | Status: DC | PRN
  Administered 2016-10-12: 10 mL
  Filled 2016-10-12: qty 10

## 2016-10-12 MED ORDER — TRASTUZUMAB CHEMO 150 MG IV SOLR
6.0000 mg/kg | Freq: Once | INTRAVENOUS | Status: AC
  Administered 2016-10-12: 462 mg via INTRAVENOUS
  Filled 2016-10-12: qty 22

## 2016-10-12 MED ORDER — ZOLEDRONIC ACID 4 MG/100ML IV SOLN
4.0000 mg | Freq: Once | INTRAVENOUS | Status: AC
  Administered 2016-10-12: 4 mg via INTRAVENOUS
  Filled 2016-10-12: qty 100

## 2016-10-12 MED ORDER — DIPHENHYDRAMINE HCL 25 MG PO CAPS
ORAL_CAPSULE | ORAL | Status: AC
  Filled 2016-10-12: qty 2

## 2016-10-12 MED ORDER — ACETAMINOPHEN 325 MG PO TABS
ORAL_TABLET | ORAL | Status: AC
  Filled 2016-10-12: qty 2

## 2016-10-12 MED ORDER — HEPARIN SOD (PORK) LOCK FLUSH 100 UNIT/ML IV SOLN
500.0000 [IU] | Freq: Once | INTRAVENOUS | Status: AC | PRN
  Administered 2016-10-12: 500 [IU]
  Filled 2016-10-12: qty 5

## 2016-10-12 MED ORDER — ALTEPLASE 2 MG IJ SOLR
2.0000 mg | Freq: Once | INTRAMUSCULAR | Status: DC | PRN
  Filled 2016-10-12: qty 2

## 2016-10-12 MED ORDER — HEPARIN SOD (PORK) LOCK FLUSH 100 UNIT/ML IV SOLN
500.0000 [IU] | Freq: Once | INTRAVENOUS | Status: DC | PRN
  Filled 2016-10-12: qty 5

## 2016-10-12 MED ORDER — DIPHENHYDRAMINE HCL 25 MG PO CAPS
50.0000 mg | ORAL_CAPSULE | Freq: Once | ORAL | Status: AC
  Administered 2016-10-12: 50 mg via ORAL

## 2016-10-12 MED ORDER — SODIUM CHLORIDE 0.9% FLUSH
10.0000 mL | INTRAVENOUS | Status: DC | PRN
  Filled 2016-10-12: qty 10

## 2016-10-12 MED ORDER — FULVESTRANT 250 MG/5ML IM SOLN
500.0000 mg | INTRAMUSCULAR | Status: DC
  Administered 2016-10-12: 500 mg via INTRAMUSCULAR
  Filled 2016-10-12: qty 10

## 2016-10-12 MED ORDER — SODIUM CHLORIDE 0.9 % IV SOLN
Freq: Once | INTRAVENOUS | Status: AC
  Administered 2016-10-12: 10:00:00 via INTRAVENOUS

## 2016-10-12 MED ORDER — SODIUM CHLORIDE 0.9 % IV SOLN: Freq: Once | INTRAVENOUS | Status: DC

## 2016-10-12 MED ORDER — ACETAMINOPHEN 325 MG PO TABS
650.0000 mg | ORAL_TABLET | Freq: Once | ORAL | Status: AC
  Administered 2016-10-12: 650 mg via ORAL

## 2016-10-12 MED ORDER — HEPARIN SOD (PORK) LOCK FLUSH 100 UNIT/ML IV SOLN
250.0000 [IU] | Freq: Once | INTRAVENOUS | Status: DC | PRN
  Filled 2016-10-12: qty 5

## 2016-10-12 NOTE — Patient Instructions (Signed)
C  Fulvestrant injection What is this medicine? FULVESTRANT (ful VES trant) blocks the effects of estrogen. It is used to treat breast cancer. This medicine may be used for other purposes; ask your health care provider or pharmacist if you have questions. What should I tell my health care provider before I take this medicine? They need to know if you have any of these conditions: -bleeding problems -liver disease -low levels of platelets in the blood -an unusual or allergic reaction to fulvestrant, other medicines, foods, dyes, or preservatives -pregnant or trying to get pregnant -breast-feeding How should I use this medicine? This medicine is for injection into a muscle. It is usually given by a health care professional in a hospital or clinic setting. Talk to your pediatrician regarding the use of this medicine in children. Special care may be needed. Overdosage: If you think you have taken too much of this medicine contact a poison control center or emergency room at once. NOTE: This medicine is only for you. Do not share this medicine with others. What if I miss a dose? It is important not to miss your dose. Call your doctor or health care professional if you are unable to keep an appointment. What may interact with this medicine? -medicines that treat or prevent blood clots like warfarin, enoxaparin, and dalteparin This list may not describe all possible interactions. Give your health care provider a list of all the medicines, herbs, non-prescription drugs, or dietary supplements you use. Also tell them if you smoke, drink alcohol, or use illegal drugs. Some items may interact with your medicine. What should I watch for while using this medicine? Your condition will be monitored carefully while you are receiving this medicine. You will need important blood work done while you are taking this medicine. Do not become pregnant while taking this medicine or for at least 1 year after  stopping it. Women of child-bearing potential will need to have a negative pregnancy test before starting this medicine. Women should inform their doctor if they wish to become pregnant or think they might be pregnant. There is a potential for serious side effects to an unborn child. Men should inform their doctors if they wish to father a child. This medicine may lower sperm counts. Talk to your health care professional or pharmacist for more information. Do not breast-feed an infant while taking this medicine or for 1 year after the last dose. What side effects may I notice from receiving this medicine? Side effects that you should report to your doctor or health care professional as soon as possible: -allergic reactions like skin rash, itching or hives, swelling of the face, lips, or tongue -feeling faint or lightheaded, falls -pain, tingling, numbness, or weakness in the legs -signs and symptoms of infection like fever or chills; cough; flu-like symptoms; sore throat -vaginal bleeding Side effects that usually do not require medical attention (report to your doctor or health care professional if they continue or are bothersome): -aches, pains -constipation -diarrhea -headache -hot flashes -nausea, vomiting -pain at site where injected -stomach pain This list may not describe all possible side effects. Call your doctor for medical advice about side effects. You may report side effects to FDA at 1-800-FDA-1088. Where should I keep my medicine? This drug is given in a hospital or clinic and will not be stored at home. NOTE: This sheet is a summary. It may not cover all possible information. If you have questions about this medicine, talk to your doctor, pharmacist,  or health care provider.    2016, Elsevier/Gold Standard. (2015-05-01 11:03:55) Zoledronic Acid injection (Hypercalcemia, Oncology) What is this medicine? ZOLEDRONIC ACID (ZOE le dron ik AS id) lowers the amount of calcium loss  from bone. It is used to treat too much calcium in your blood from cancer. It is also used to prevent complications of cancer that has spread to the bone. This medicine may be used for other purposes; ask your health care provider or pharmacist if you have questions. What should I tell my health care provider before I take this medicine? They need to know if you have any of these conditions: -aspirin-sensitive asthma -cancer, especially if you are receiving medicines used to treat cancer -dental disease or wear dentures -infection -kidney disease -receiving corticosteroids like dexamethasone or prednisone -an unusual or allergic reaction to zoledronic acid, other medicines, foods, dyes, or preservatives -pregnant or trying to get pregnant -breast-feeding How should I use this medicine? This medicine is for infusion into a vein. It is given by a health care professional in a hospital or clinic setting. Talk to your pediatrician regarding the use of this medicine in children. Special care may be needed. Overdosage: If you think you have taken too much of this medicine contact a poison control center or emergency room at once. NOTE: This medicine is only for you. Do not share this medicine with others. What if I miss a dose? It is important not to miss your dose. Call your doctor or health care professional if you are unable to keep an appointment. What may interact with this medicine? -certain antibiotics given by injection -NSAIDs, medicines for pain and inflammation, like ibuprofen or naproxen -some diuretics like bumetanide, furosemide -teriparatide -thalidomide This list may not describe all possible interactions. Give your health care provider a list of all the medicines, herbs, non-prescription drugs, or dietary supplements you use. Also tell them if you smoke, drink alcohol, or use illegal drugs. Some items may interact with your medicine. What should I watch for while using this  medicine? Visit your doctor or health care professional for regular checkups. It may be some time before you see the benefit from this medicine. Do not stop taking your medicine unless your doctor tells you to. Your doctor may order blood tests or other tests to see how you are doing. Women should inform their doctor if they wish to become pregnant or think they might be pregnant. There is a potential for serious side effects to an unborn child. Talk to your health care professional or pharmacist for more information. You should make sure that you get enough calcium and vitamin D while you are taking this medicine. Discuss the foods you eat and the vitamins you take with your health care professional. Some people who take this medicine have severe bone, joint, and/or muscle pain. This medicine may also increase your risk for jaw problems or a broken thigh bone. Tell your doctor right away if you have severe pain in your jaw, bones, joints, or muscles. Tell your doctor if you have any pain that does not go away or that gets worse. Tell your dentist and dental surgeon that you are taking this medicine. You should not have major dental surgery while on this medicine. See your dentist to have a dental exam and fix any dental problems before starting this medicine. Take good care of your teeth while on this medicine. Make sure you see your dentist for regular follow-up appointments. What side effects may  I notice from receiving this medicine? Side effects that you should report to your doctor or health care professional as soon as possible: -allergic reactions like skin rash, itching or hives, swelling of the face, lips, or tongue -anxiety, confusion, or depression -breathing problems -changes in vision -eye pain -feeling faint or lightheaded, falls -jaw pain, especially after dental work -mouth sores -muscle cramps, stiffness, or weakness -redness, blistering, peeling or loosening of the skin, including  inside the mouth -trouble passing urine or change in the amount of urine Side effects that usually do not require medical attention (report to your doctor or health care professional if they continue or are bothersome): -bone, joint, or muscle pain -constipation -diarrhea -fever -hair loss -irritation at site where injected -loss of appetite -nausea, vomiting -stomach upset -trouble sleeping -trouble swallowing -weak or tired This list may not describe all possible side effects. Call your doctor for medical advice about side effects. You may report side effects to FDA at 1-800-FDA-1088. Where should I keep my medicine? This drug is given in a hospital or clinic and will not be stored at home. NOTE: This sheet is a summary. It may not cover all possible information. If you have questions about this medicine, talk to your doctor, pharmacist, or health care provider.    2016, Elsevier/Gold Standard. (2014-03-01 14:19:39)

## 2016-10-12 NOTE — Patient Instructions (Addendum)
[facility] Discharge Instructions for Patients Receiving Chemotherapy  Today you received the following chemotherapy agents:  Herceptin & Zometa  To help prevent nausea and vomiting after your treatment, we encourage you to take your nausea medication as ordered per MD.   If you develop nausea and vomiting that is not controlled by your nausea medication, call the clinic.   BELOW ARE SYMPTOMS THAT SHOULD BE REPORTED IMMEDIATELY:  *FEVER GREATER THAN 100.5 F  *CHILLS WITH OR WITHOUT FEVER  NAUSEA AND VOMITING THAT IS NOT CONTROLLED WITH YOUR NAUSEA MEDICATION  *UNUSUAL SHORTNESS OF BREATH  *UNUSUAL BRUISING OR BLEEDING  TENDERNESS IN MOUTH AND THROAT WITH OR WITHOUT PRESENCE OF ULCERS  *URINARY PROBLEMS  *BOWEL PROBLEMS  UNUSUAL RASH Items with * indicate a potential emergency and should be followed up as soon as possible.  Feel free to call the clinic you have any questions or concerns. The clinic phone number is [phone].  Please show the [facility] at check-in to the Emergency Department and triage nurse.  Zoledronic Acid injection (Hypercalcemia, Oncology) What is this medicine? ZOLEDRONIC ACID (ZOE le dron ik AS id) lowers the amount of calcium loss from bone. It is used to treat too much calcium in your blood from cancer. It is also used to prevent complications of cancer that has spread to the bone. This medicine may be used for other purposes; ask your health care provider or pharmacist if you have questions. COMMON BRAND NAME(S): Zometa What should I tell my health care provider before I take this medicine? They need to know if you have any of these conditions: -aspirin-sensitive asthma -cancer, especially if you are receiving medicines used to treat cancer -dental disease or wear dentures -infection -kidney disease -receiving corticosteroids like dexamethasone or prednisone -an unusual or allergic reaction to zoledronic acid, other  medicines, foods, dyes, or preservatives -pregnant or trying to get pregnant -breast-feeding How should I use this medicine? This medicine is for infusion into a vein. It is given by a health care professional in a hospital or clinic setting. Talk to your pediatrician regarding the use of this medicine in children. Special care may be needed. Overdosage: If you think you have taken too much of this medicine contact a poison control center or emergency room at once. NOTE: This medicine is only for you. Do not share this medicine with others. What if I miss a dose? It is important not to miss your dose. Call your doctor or health care professional if you are unable to keep an appointment. What may interact with this medicine? -certain antibiotics given by injection -NSAIDs, medicines for pain and inflammation, like ibuprofen or naproxen -some diuretics like bumetanide, furosemide -teriparatide -thalidomide This list may not describe all possible interactions. Give your health care provider a list of all the medicines, herbs, non-prescription drugs, or dietary supplements you use. Also tell them if you smoke, drink alcohol, or use illegal drugs. Some items may interact with your medicine. What should I watch for while using this medicine? Visit your doctor or health care professional for regular checkups. It may be some time before you see the benefit from this medicine. Do not stop taking your medicine unless your doctor tells you to. Your doctor may order blood tests or other tests to see how you are doing. Women should inform their doctor if they wish to become pregnant or think they might be pregnant. There is a potential for serious side effects to an  unborn child. Talk to your health care professional or pharmacist for more information. You should make sure that you get enough calcium and vitamin D while you are taking this medicine. Discuss the foods you eat and the vitamins you take with  your health care professional. Some people who take this medicine have severe bone, joint, and/or muscle pain. This medicine may also increase your risk for jaw problems or a broken thigh bone. Tell your doctor right away if you have severe pain in your jaw, bones, joints, or muscles. Tell your doctor if you have any pain that does not go away or that gets worse. Tell your dentist and dental surgeon that you are taking this medicine. You should not have major dental surgery while on this medicine. See your dentist to have a dental exam and fix any dental problems before starting this medicine. Take good care of your teeth while on this medicine. Make sure you see your dentist for regular follow-up appointments. What side effects may I notice from receiving this medicine? Side effects that you should report to your doctor or health care professional as soon as possible: -allergic reactions like skin rash, itching or hives, swelling of the face, lips, or tongue -anxiety, confusion, or depression -breathing problems -changes in vision -eye pain -feeling faint or lightheaded, falls -jaw pain, especially after dental work -mouth sores -muscle cramps, stiffness, or weakness -redness, blistering, peeling or loosening of the skin, including inside the mouth -trouble passing urine or change in the amount of urine Side effects that usually do not require medical attention (report to your doctor or health care professional if they continue or are bothersome): -bone, joint, or muscle pain -constipation -diarrhea -fever -hair loss -irritation at site where injected -loss of appetite -nausea, vomiting -stomach upset -trouble sleeping -trouble swallowing -weak or tired This list may not describe all possible side effects. Call your doctor for medical advice about side effects. You may report side effects to FDA at 1-800-FDA-1088. Where should I keep my medicine? This drug is given in a hospital or  clinic and will not be stored at home. NOTE: This sheet is a summary. It may not cover all possible information. If you have questions about this medicine, talk to your doctor, pharmacist, or health care provider.  2017 Elsevier/Gold Standard (2014-03-01 14:19:39)

## 2016-10-12 NOTE — Progress Notes (Signed)
Patient reported occasional facial flushing. Spoke with Biochemist, clinical. Val aware and states not treatment related.  Patient will call PCP and follow up with him.

## 2016-10-25 ENCOUNTER — Telehealth: Payer: Self-pay | Admitting: Hematology and Oncology

## 2016-10-25 NOTE — Telephone Encounter (Signed)
spoke to patient to advise that appt time had changed on 11/09/16 from 9:30am to 11:45am

## 2016-10-26 ENCOUNTER — Ambulatory Visit: Payer: Managed Care, Other (non HMO) | Admitting: Gastroenterology

## 2016-11-08 NOTE — Assessment & Plan Note (Signed)
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 Was on Arimidex since 08/22/2012 to 03/30/16  Subcutaneous nodule excisionleft chest: Infiltrating carcinoma breast primary, ER positive, PR negative, HER-2 positive  CT CAP and bone scan06/13/2017: Lytic lesions T8 vertebral, T1 posterior element, subcutaneous nodule left lateral chest wall, nonspecific lung nodules; Bone scan: Mets to kull, left humerus, left eighth rib, T7/T8, sternum, left acetabulum. (In retrospect, original breast MRI in 2002 revealed sternal lesions concerning for metastatic disease, PET CT scan did not reveal metastases)  Goals of treatment: Palliation and prolongation of life. Patient understands that stage IV breast cancer cannot be cured but it can be managed with fairly long survivals.  Treatment Plan: Herceptin Q 4 weeks, Lapatinib, Faslodex, Zometa, started 04/28/2016, today is cycle 7  Toxicities: 1. Diarrhea from lapatinib 2. Injection site discomfort from Faslodex Lab work from today was reviewed.  CT CAP: 08/10/2016: Interval response to therapy. Multifocal areas of abnormal sclerosis healing bone mets. Interval decrease in left lateral chest wall mets. Tiny lung nodules are not seen.  Radiology review: Patient Is thrilled to hear the results.  Echocardiogram in [facility] October 2017 showed EF 55-60%  RTC in 8 weeks

## 2016-11-09 ENCOUNTER — Ambulatory Visit (HOSPITAL_BASED_OUTPATIENT_CLINIC_OR_DEPARTMENT_OTHER): Payer: Managed Care, Other (non HMO) | Admitting: Hematology and Oncology

## 2016-11-09 ENCOUNTER — Other Ambulatory Visit (HOSPITAL_BASED_OUTPATIENT_CLINIC_OR_DEPARTMENT_OTHER): Payer: Managed Care, Other (non HMO)

## 2016-11-09 ENCOUNTER — Encounter: Payer: Self-pay | Admitting: Hematology and Oncology

## 2016-11-09 ENCOUNTER — Ambulatory Visit: Payer: Managed Care, Other (non HMO)

## 2016-11-09 ENCOUNTER — Ambulatory Visit (HOSPITAL_BASED_OUTPATIENT_CLINIC_OR_DEPARTMENT_OTHER): Payer: Managed Care, Other (non HMO)

## 2016-11-09 DIAGNOSIS — C50412 Malignant neoplasm of upper-outer quadrant of left female breast: Secondary | ICD-10-CM | POA: Diagnosis not present

## 2016-11-09 DIAGNOSIS — Z5111 Encounter for antineoplastic chemotherapy: Secondary | ICD-10-CM

## 2016-11-09 DIAGNOSIS — C7951 Secondary malignant neoplasm of bone: Secondary | ICD-10-CM | POA: Diagnosis not present

## 2016-11-09 DIAGNOSIS — C773 Secondary and unspecified malignant neoplasm of axilla and upper limb lymph nodes: Secondary | ICD-10-CM

## 2016-11-09 DIAGNOSIS — Z170 Estrogen receptor positive status [ER+]: Secondary | ICD-10-CM

## 2016-11-09 DIAGNOSIS — R911 Solitary pulmonary nodule: Secondary | ICD-10-CM

## 2016-11-09 DIAGNOSIS — R229 Localized swelling, mass and lump, unspecified: Secondary | ICD-10-CM

## 2016-11-09 DIAGNOSIS — Z5112 Encounter for antineoplastic immunotherapy: Secondary | ICD-10-CM | POA: Diagnosis not present

## 2016-11-09 LAB — CBC WITH DIFFERENTIAL/PLATELET
BASO%: 0.7 % (ref 0.0–2.0)
Basophils Absolute: 0 10*3/uL (ref 0.0–0.1)
EOS%: 2.5 % (ref 0.0–7.0)
Eosinophils Absolute: 0.2 10*3/uL (ref 0.0–0.5)
HCT: 38.9 % (ref 34.8–46.6)
HGB: 13 g/dL (ref 11.6–15.9)
LYMPH%: 36.3 % (ref 14.0–49.7)
MCH: 30.2 pg (ref 25.1–34.0)
MCHC: 33.3 g/dL (ref 31.5–36.0)
MCV: 90.7 fL (ref 79.5–101.0)
MONO#: 0.7 10*3/uL (ref 0.1–0.9)
MONO%: 10.8 % (ref 0.0–14.0)
NEUT#: 3.1 10*3/uL (ref 1.5–6.5)
NEUT%: 49.7 % (ref 38.4–76.8)
Platelets: 248 10*3/uL (ref 145–400)
RBC: 4.29 10*6/uL (ref 3.70–5.45)
RDW: 13.6 % (ref 11.2–14.5)
WBC: 6.3 10*3/uL (ref 3.9–10.3)
lymph#: 2.3 10*3/uL (ref 0.9–3.3)

## 2016-11-09 LAB — COMPREHENSIVE METABOLIC PANEL
ALT: 29 U/L (ref 0–55)
AST: 25 U/L (ref 5–34)
Albumin: 4.1 g/dL (ref 3.5–5.0)
Alkaline Phosphatase: 105 U/L (ref 40–150)
Anion Gap: 11 mEq/L (ref 3–11)
BUN: 12.7 mg/dL (ref 7.0–26.0)
CO2: 27 mEq/L (ref 22–29)
Calcium: 9.7 mg/dL (ref 8.4–10.4)
Chloride: 103 mEq/L (ref 98–109)
Creatinine: 0.7 mg/dL (ref 0.6–1.1)
EGFR: 88 mL/min/{1.73_m2} — ABNORMAL LOW (ref >90)
Glucose: 89 mg/dl (ref 70–140)
Potassium: 3.7 mEq/L (ref 3.5–5.1)
Sodium: 141 mEq/L (ref 136–145)
Total Bilirubin: 0.63 mg/dL (ref 0.20–1.20)
Total Protein: 7.3 g/dL (ref 6.4–8.3)

## 2016-11-09 MED ORDER — ACETAMINOPHEN 325 MG PO TABS
ORAL_TABLET | ORAL | Status: AC
  Filled 2016-11-09: qty 2

## 2016-11-09 MED ORDER — SODIUM CHLORIDE 0.9 % IV SOLN
Freq: Once | INTRAVENOUS | Status: AC
  Administered 2016-11-09: 13:00:00 via INTRAVENOUS

## 2016-11-09 MED ORDER — SODIUM CHLORIDE 0.9% FLUSH
10.0000 mL | INTRAVENOUS | Status: DC | PRN
  Administered 2016-11-09: 10 mL
  Filled 2016-11-09: qty 10

## 2016-11-09 MED ORDER — DIPHENHYDRAMINE HCL 25 MG PO CAPS
50.0000 mg | ORAL_CAPSULE | Freq: Once | ORAL | Status: AC
  Administered 2016-11-09: 50 mg via ORAL

## 2016-11-09 MED ORDER — ZOLEDRONIC ACID 4 MG/100ML IV SOLN
4.0000 mg | Freq: Once | INTRAVENOUS | Status: AC
  Administered 2016-11-09: 4 mg via INTRAVENOUS
  Filled 2016-11-09: qty 100

## 2016-11-09 MED ORDER — HEPARIN SOD (PORK) LOCK FLUSH 100 UNIT/ML IV SOLN
500.0000 [IU] | Freq: Once | INTRAVENOUS | Status: AC | PRN
  Administered 2016-11-09: 500 [IU]
  Filled 2016-11-09: qty 5

## 2016-11-09 MED ORDER — FULVESTRANT 250 MG/5ML IM SOLN
500.0000 mg | INTRAMUSCULAR | Status: DC
  Administered 2016-11-09: 500 mg via INTRAMUSCULAR
  Filled 2016-11-09: qty 10

## 2016-11-09 MED ORDER — LAPATINIB DITOSYLATE 250 MG PO TABS: ORAL_TABLET | ORAL | 6 refills | Status: DC

## 2016-11-09 MED ORDER — TRASTUZUMAB CHEMO 150 MG IV SOLR
6.0000 mg/kg | Freq: Once | INTRAVENOUS | Status: AC
  Administered 2016-11-09: 462 mg via INTRAVENOUS
  Filled 2016-11-09: qty 22

## 2016-11-09 MED ORDER — DIPHENHYDRAMINE HCL 25 MG PO CAPS
ORAL_CAPSULE | ORAL | Status: AC
  Filled 2016-11-09: qty 2

## 2016-11-09 MED ORDER — ACETAMINOPHEN 325 MG PO TABS
650.0000 mg | ORAL_TABLET | Freq: Once | ORAL | Status: AC
  Administered 2016-11-09: 650 mg via ORAL

## 2016-11-09 NOTE — Progress Notes (Signed)
Patient Care Team: [name], MD as PCP - General (Family Medicine)  DIAGNOSIS:  Encounter Diagnosis  Name Primary?  . Malignant neoplasm of upper-outer quadrant of left breast in female, estrogen receptor positive ([place])     SUMMARY OF ONCOLOGIC HISTORY:   Breast cancer of upper-outer quadrant of left female breast ([place])   11/14/2011 Surgery    Bilateral mastectomy, prophylactic on the right, left breast IDC 3/18 lymph nodes positive with extracapsular extension ER 89%, PR 81%, HER-2 negative, Ki-67 79% T2 N1 A. stage IIB      12/13/2011 - 06/28/2012 Chemotherapy    4 cycles of FEC followed by 4 cycles of Taxotere      07/17/2012 - 08/22/2012 Radiation Therapy    Adjuvant radiation therapy      08/22/2012 - 03/16/2016 Anti-estrogen oral therapy    Arimidex 1 mg daily      03/16/2016 Relapse/Recurrence    Subcutaneous nodule excision left chest: Infiltrating carcinoma breast primary, ER positive, PR negative      03/29/2016 Imaging    CT CAP and bone scan: Lytic lesions T8 vertebral, T1 posterior element, subcutaneous nodule left lateral chest wall, nonspecific lung nodules; Bone scan: Mets to kull, left humerus, left eighth rib, T7/T8, sternum, left acetabulum      04/28/2016 -  Chemotherapy    Herceptin, lapatinib, Faslodex, Zometa every 4 weeks       CHIEF COMPLIANT: Cycle 8 Herceptin, lapatinib, Faslodex, Zometa  INTERVAL HISTORY: [name] Riley is a 66 year old lady with metastatic breast cancer currently on Herceptin with lapatinib along with Faslodex and Zometa. She is tolerating the lapatinib fairly well. She does have diarrhea which is well controlled with Imodium. The subcutaneous nodules beneath the breasts appear to be slightly reddened in discoloration. There is no change in its consistency of nature. No other new complaints or concerns. She has fairly normal appetite and maintaining her weight. She does drink quite a bit of soft drinks. I encouraged her not to  drink diet sodas.  REVIEW OF SYSTEMS:   Constitutional: Denies fevers, chills or abnormal weight loss Eyes: Denies blurriness of vision Ears, nose, mouth, throat, and face: Denies mucositis or sore throat Respiratory: Denies cough, dyspnea or wheezes Cardiovascular: Denies palpitation, chest discomfort Gastrointestinal:  Denies nausea, heartburn or change in bowel habits Skin: Denies abnormal skin rashes Lymphatics: Denies new lymphadenopathy or easy bruising Neurological:Denies numbness, tingling or new weaknesses Behavioral/Psych: Mood is stable, no new changes  Extremities: No lower extremity edema Breast:  denies any pain or lumps or nodules in either breasts All other systems were reviewed with the patient and are negative.  I have reviewed the past medical history, past surgical history, social history and family history with the patient and they are unchanged from previous note.  ALLERGIES:  is allergic to contrast media [iodinated diagnostic agents] and food.  MEDICATIONS:  Current Outpatient Prescriptions  Medication Sig Dispense Refill  . acetaminophen (TYLENOL) 500 MG tablet Take 1,000 mg by mouth every 6 (six) hours as needed for moderate pain.    Marland Kitchen aspirin 81 MG tablet Take 81 mg by mouth daily.      . Cholecalciferol (VITAMIN D-3 PO) Take 2,000 Int'l Units by mouth daily.    Marland Kitchen doxycycline (VIBRAMYCIN) 100 MG capsule Take 1 capsule (100 mg total) by mouth 2 (two) times daily. 20 capsule 0  . levothyroxine (SYNTHROID, LEVOTHROID) 75 MCG tablet Take 75 mcg by mouth daily.     Marland Kitchen lidocaine-prilocaine (EMLA) cream Apply  to affected area once 30 g 3  . metoprolol succinate (TOPROL-XL) 100 MG 24 hr tablet Take 1 tablet (100 mg total) by mouth daily. 90 tablet 3  . Multiple Vitamins-Minerals (MULTIVITAMIN WITH MINERALS) tablet Take 1 tablet by mouth daily.      . potassium chloride SA (K-DUR,KLOR-CON) 20 MEQ tablet Take 1 tablet (20 mEq total) by mouth daily. 30 tablet 0  .  predniSONE (DELTASONE) 50 MG tablet Take 1 tablet 13, 7, and 1 hour prior to CT scan. 5 tablet 0  . TYKERB 250 MG tablet TAKE 5 TABLETS (1250MG TOTAL) BY MOUTH DAILY  150 tablet 1   No current facility-administered medications for this visit.     PHYSICAL EXAMINATION: ECOG PERFORMANCE STATUS: 1 - Symptomatic but completely ambulatory  Vitals:   11/09/16 1216  BP: (!) 168/90  Pulse: 79  Resp: 18  Temp: 98 F (36.7 C)   Filed Weights   11/09/16 1216  Weight: 160 lb 8 oz (72.8 kg)    GENERAL:alert, no distress and comfortable SKIN: skin color, texture, turgor are normal, no rashes or significant lesions EYES: normal, Conjunctiva are pink and non-injected, sclera clear OROPHARYNX:no exudate, no erythema and lips, buccal mucosa, and tongue normal  NECK: supple, thyroid normal size, non-tender, without nodularity LYMPH:  no palpable lymphadenopathy in the cervical, axillary or inguinal LUNGS: clear to auscultation and percussion with normal breathing effort HEART: regular rate & rhythm and no murmurs and no lower extremity edema ABDOMEN:abdomen soft, non-tender and normal bowel sounds MUSCULOSKELETAL:no cyanosis of digits and no clubbing  NEURO: alert & oriented x 3 with fluent speech, no focal motor/sensory deficits EXTREMITIES: No lower extremity edema  LABORATORY DATA:  I have reviewed the data as listed   Chemistry      Component Value Date/Time   NA 141 10/12/2016 0905   K 3.8 10/12/2016 0905   CL 106 07/03/2016 0939   CL 105 08/10/2012 0908   CO2 25 10/12/2016 0905   BUN 12.6 10/12/2016 0905   CREATININE 0.7 10/12/2016 0905      Component Value Date/Time   CALCIUM 9.3 10/12/2016 0905   ALKPHOS 98 10/12/2016 0905   AST 28 10/12/2016 0905   ALT 28 10/12/2016 0905   BILITOT 0.63 10/12/2016 0905       Lab Results  Component Value Date   WBC 6.3 11/09/2016   HGB 13.0 11/09/2016   HCT 38.9 11/09/2016   MCV 90.7 11/09/2016   PLT 248 11/09/2016   NEUTROABS 3.1  11/09/2016    ASSESSMENT & PLAN:  Breast cancer of upper-outer quadrant of left female breast ([place]) Left breast invasive ductal carcinomaT2, N1, 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 Was on Arimidex since 08/22/2012 to 03/30/16  Subcutaneous nodule excisionleft chest: Infiltrating carcinoma breast primary, ER positive, PR negative, HER-2 positive  CT CAP and bone scan06/13/2017: Lytic lesions T8 vertebral, T1 posterior element, subcutaneous nodule left lateral chest wall, nonspecific lung nodules; Bone scan: Mets to kull, left humerus, left eighth rib, T7/T8, sternum, left acetabulum. (In retrospect, original breast MRI in 2002 revealed sternal lesions concerning for metastatic disease, PET CT scan did not reveal metastases)  Goals of treatment: Palliation and prolongation of life. Patient understands that stage IV breast cancer cannot be cured but it can be managed with fairly long survivals.  Treatment Plan: Herceptin  Q 4 weeks, Lapatinib, Faslodex, Zometa, started 04/28/2016, today is cycle 8  Toxicities: 1. Diarrhea from lapatinib: Appears to be well controlled 2. Injection site discomfort from Faslodex Lab work from today was reviewed.  CT CAP: 08/10/2016: Interval response to therapy. Multifocal areas of abnormal sclerosis healing bone mets. Interval decrease in left lateral chest wall mets. Tiny lung nodules are not seen.  [facility]ch[facility] October 2017 showed EF 55-60% I plan to obtain CT chest abdomen pelvis and bone scan in April 2018 RTC in 8 weeks   I spent 25 minutes talking to the patient of which more than half was spent in counseling and coordination of care.  No orders of the defined types were placed in this encounter.  The patient has a good understanding of the overall plan. she agrees with it. she will  call with any problems that may develop before the next visit here.   Rulon Eisenmenger, MD 11/09/16

## 2016-11-09 NOTE — Patient Instructions (Signed)
[facility] Discharge Instructions for Patients Receiving Chemotherapy  Today you received the following chemotherapy agents Herceptin  To help prevent nausea and vomiting after your treatment, we encourage you to take your nausea medication as prescribed.   If you develop nausea and vomiting that is not controlled by your nausea medication, call the clinic.   BELOW ARE SYMPTOMS THAT SHOULD BE REPORTED IMMEDIATELY:  *FEVER GREATER THAN 100.5 F  *CHILLS WITH OR WITHOUT FEVER  NAUSEA AND VOMITING THAT IS NOT CONTROLLED WITH YOUR NAUSEA MEDICATION  *UNUSUAL SHORTNESS OF BREATH  *UNUSUAL BRUISING OR BLEEDING  TENDERNESS IN MOUTH AND THROAT WITH OR WITHOUT PRESENCE OF ULCERS  *URINARY PROBLEMS  *BOWEL PROBLEMS  UNUSUAL RASH Items with * indicate a potential emergency and should be followed up as soon as possible.  Feel free to call the clinic you have any questions or concerns. The clinic phone number is [phone].  Please show the [place] at check-in to the Emergency Department and triage nurse.  Zoledronic Acid injection (Hypercalcemia, Oncology) (Zometa) What is this medicine? ZOLEDRONIC ACID (ZOE le dron ik AS id) lowers the amount of calcium loss from bone. It is used to treat too much calcium in your blood from cancer. It is also used to prevent complications of cancer that has spread to the bone. This medicine may be used for other purposes; ask your health care provider or pharmacist if you have questions. COMMON BRAND NAME(S): Zometa What should I tell my health care provider before I take this medicine? They need to know if you have any of these conditions: -aspirin-sensitive asthma -cancer, especially if you are receiving medicines used to treat cancer -dental disease or wear dentures -infection -kidney disease -receiving corticosteroids like dexamethasone or prednisone -an unusual or allergic reaction to zoledronic acid, other medicines,  foods, dyes, or preservatives -pregnant or trying to get pregnant -breast-feeding How should I use this medicine? This medicine is for infusion into a vein. It is given by a health care professional in a hospital or clinic setting. Talk to your pediatrician regarding the use of this medicine in children. Special care may be needed. Overdosage: If you think you have taken too much of this medicine contact a poison control center or emergency room at once. NOTE: This medicine is only for you. Do not share this medicine with others. What if I miss a dose? It is important not to miss your dose. Call your doctor or health care professional if you are unable to keep an appointment. What may interact with this medicine? -certain antibiotics given by injection -NSAIDs, medicines for pain and inflammation, like ibuprofen or naproxen -some diuretics like bumetanide, furosemide -teriparatide -thalidomide This list may not describe all possible interactions. Give your health care provider a list of all the medicines, herbs, non-prescription drugs, or dietary supplements you use. Also tell them if you smoke, drink alcohol, or use illegal drugs. Some items may interact with your medicine. What should I watch for while using this medicine? Visit your doctor or health care professional for regular checkups. It may be some time before you see the benefit from this medicine. Do not stop taking your medicine unless your doctor tells you to. Your doctor may order blood tests or other tests to see how you are doing. Women should inform their doctor if they wish to become pregnant or think they might be pregnant. There is a potential for serious side effects to an unborn child. Talk to  your health care professional or pharmacist for more information. You should make sure that you get enough calcium and vitamin D while you are taking this medicine. Discuss the foods you eat and the vitamins you take with your health  care professional. Some people who take this medicine have severe bone, joint, and/or muscle pain. This medicine may also increase your risk for jaw problems or a broken thigh bone. Tell your doctor right away if you have severe pain in your jaw, bones, joints, or muscles. Tell your doctor if you have any pain that does not go away or that gets worse. Tell your dentist and dental surgeon that you are taking this medicine. You should not have major dental surgery while on this medicine. See your dentist to have a dental exam and fix any dental problems before starting this medicine. Take good care of your teeth while on this medicine. Make sure you see your dentist for regular follow-up appointments. What side effects may I notice from receiving this medicine? Side effects that you should report to your doctor or health care professional as soon as possible: -allergic reactions like skin rash, itching or hives, swelling of the face, lips, or tongue -anxiety, confusion, or depression -breathing problems -changes in vision -eye pain -feeling faint or lightheaded, falls -jaw pain, especially after dental work -mouth sores -muscle cramps, stiffness, or weakness -redness, blistering, peeling or loosening of the skin, including inside the mouth -trouble passing urine or change in the amount of urine Side effects that usually do not require medical attention (report to your doctor or health care professional if they continue or are bothersome): -bone, joint, or muscle pain -constipation -diarrhea -fever -hair loss -irritation at site where injected -loss of appetite -nausea, vomiting -stomach upset -trouble sleeping -trouble swallowing -weak or tired This list may not describe all possible side effects. Call your doctor for medical advice about side effects. You may report side effects to FDA at 1-800-FDA-1088. Where should I keep my medicine? This drug is given in a hospital or clinic and  will not be stored at home. NOTE: This sheet is a summary. It may not cover all possible information. If you have questions about this medicine, talk to your doctor, pharmacist, or health care provider.  2017 Elsevier/Gold Standard (2014-03-01 14:19:39)  Fulvestrant injection (Faslodex) What is this medicine? FULVESTRANT (ful VES trant) blocks the effects of estrogen. It is used to treat breast cancer. This medicine may be used for other purposes; ask your health care provider or pharmacist if you have questions. COMMON BRAND NAME(S): FASLODEX What should I tell my health care provider before I take this medicine? They need to know if you have any of these conditions: -bleeding problems -liver disease -low levels of platelets in the blood -an unusual or allergic reaction to fulvestrant, other medicines, foods, dyes, or preservatives -pregnant or trying to get pregnant -breast-feeding How should I use this medicine? This medicine is for injection into a muscle. It is usually given by a health care professional in a hospital or clinic setting. Talk to your pediatrician regarding the use of this medicine in children. Special care may be needed. Overdosage: If you think you have taken too much of this medicine contact a poison control center or emergency room at once. NOTE: This medicine is only for you. Do not share this medicine with others. What if I miss a dose? It is important not to miss your dose. Call your doctor or health care professional  if you are unable to keep an appointment. What may interact with this medicine? -medicines that treat or prevent blood clots like warfarin, enoxaparin, and dalteparin This list may not describe all possible interactions. Give your health care provider a list of all the medicines, herbs, non-prescription drugs, or dietary supplements you use. Also tell them if you smoke, drink alcohol, or use illegal drugs. Some items may interact with your  medicine. What should I watch for while using this medicine? Your condition will be monitored carefully while you are receiving this medicine. You will need important blood work done while you are taking this medicine. Do not become pregnant while taking this medicine or for at least 1 year after stopping it. Women of child-bearing potential will need to have a negative pregnancy test before starting this medicine. Women should inform their doctor if they wish to become pregnant or think they might be pregnant. There is a potential for serious side effects to an unborn child. Men should inform their doctors if they wish to father a child. This medicine may lower sperm counts. Talk to your health care professional or pharmacist for more information. Do not breast-feed an infant while taking this medicine or for 1 year after the last dose. What side effects may I notice from receiving this medicine? Side effects that you should report to your doctor or health care professional as soon as possible: -allergic reactions like skin rash, itching or hives, swelling of the face, lips, or tongue -feeling faint or lightheaded, falls -pain, tingling, numbness, or weakness in the legs -signs and symptoms of infection like fever or chills; cough; flu-like symptoms; sore throat -vaginal bleeding Side effects that usually do not require medical attention (report to your doctor or health care professional if they continue or are bothersome): -aches, pains -constipation -diarrhea -headache -hot flashes -nausea, vomiting -pain at site where injected -stomach pain This list may not describe all possible side effects. Call your doctor for medical advice about side effects. You may report side effects to FDA at 1-800-FDA-1088. Where should I keep my medicine? This drug is given in a hospital or clinic and will not be stored at home. NOTE: This sheet is a summary. It may not cover all possible information. If you  have questions about this medicine, talk to your doctor, pharmacist, or health care provider.  2017 Elsevier/Gold Standard (2015-05-01 11:03:55)

## 2016-11-09 NOTE — Progress Notes (Signed)
Injection was given in Infusion Room 

## 2016-11-10 ENCOUNTER — Other Ambulatory Visit: Payer: Self-pay

## 2016-11-10 MED ORDER — LAPATINIB DITOSYLATE 250 MG PO TABS: ORAL_TABLET | ORAL | 6 refills | Status: DC

## 2016-11-11 ENCOUNTER — Telehealth: Payer: Self-pay

## 2016-11-11 ENCOUNTER — Telehealth: Payer: Self-pay | Admitting: *Deleted

## 2016-11-11 ENCOUNTER — Telehealth: Payer: Self-pay | Admitting: Pharmacist

## 2016-11-11 NOTE — Telephone Encounter (Signed)
Oral Chemotherapy Pharmacist Encounter  Received notification from Dr. Geralyn Flash RN that patient's Tykerb required re-authorization. PA submitted on CoverMyMeds Key [id number] Status is pending.  We will await clinical questions and determination.  Oral Oncology Clinic will continue to follow.  Johny Drilling, PharmD, BCPS, BCOP 11/11/2016  4:54 PM Oral Oncology Clinic [phone]

## 2016-11-11 NOTE — Telephone Encounter (Signed)
Called pt back to update her regarding [employer] prior auth for tykerb medication. Prior Josem Kaufmann is still pending per [place], [facility]. Unable to reach pt x2. No voicemail set up to give call back number. Will try again on Monday.

## 2016-11-11 NOTE — Telephone Encounter (Signed)
Pt states she called [employer] [employer] for refill on her Tykerb.  Pt has one day left of current Rx.  New Rx was sent yesterday.  She says they need "Authorization" and they told her if it can be done today she will get Medication tomorrow.  The phone number to call is [phone].  The fax [phone].  Pt requests call back today to let her know if Authorization has been done.

## 2016-11-11 NOTE — Telephone Encounter (Signed)
Pt called in to speak to nurse. Pt states that she understands that her medication still needs prior auth. Pt would like a call on Monday to let her know that auth went through.

## 2016-11-14 NOTE — Telephone Encounter (Signed)
Oral Chemotherapy Pharmacist Encounter  Answered clinical question for PA. Status still pending.  This encounter will continue to be updated until final determination.  Oral Oncology Clinic will continue to follow.   Johny Drilling, PharmD, BCPS, BCOP 11/14/2016  9:38 AM Oral Oncology Clinic [phone]

## 2016-11-17 NOTE — Telephone Encounter (Signed)
Oral Chemotherapy Pharmacist Encounter  Received notification from [facility] that prior authorization of patient's Tykerb has been approved 11/14/16-05/14/17 (6 months) I called [facility] at [phone] for status of Tykerb. Medication was shipped 11/16/16 scheduled for delivery today (11/17/16).  No additional needs from [facility] identified at this time. We will sign off. Please let us know if we can be of assistance int he future.  Johny Drilling, PharmD, BCPS, BCOP 11/17/2016  1:27 PM Oral Oncology Clinic [phone]

## 2016-12-07 ENCOUNTER — Ambulatory Visit (HOSPITAL_BASED_OUTPATIENT_CLINIC_OR_DEPARTMENT_OTHER): Payer: Managed Care, Other (non HMO)

## 2016-12-07 ENCOUNTER — Other Ambulatory Visit (HOSPITAL_BASED_OUTPATIENT_CLINIC_OR_DEPARTMENT_OTHER): Payer: Managed Care, Other (non HMO)

## 2016-12-07 ENCOUNTER — Ambulatory Visit: Payer: Managed Care, Other (non HMO)

## 2016-12-07 ENCOUNTER — Ambulatory Visit: Payer: Managed Care, Other (non HMO) | Admitting: Nurse Practitioner

## 2016-12-07 DIAGNOSIS — C773 Secondary and unspecified malignant neoplasm of axilla and upper limb lymph nodes: Secondary | ICD-10-CM

## 2016-12-07 DIAGNOSIS — Z170 Estrogen receptor positive status [ER+]: Secondary | ICD-10-CM

## 2016-12-07 DIAGNOSIS — C7951 Secondary malignant neoplasm of bone: Secondary | ICD-10-CM

## 2016-12-07 DIAGNOSIS — C50412 Malignant neoplasm of upper-outer quadrant of left female breast: Secondary | ICD-10-CM

## 2016-12-07 DIAGNOSIS — Z5111 Encounter for antineoplastic chemotherapy: Secondary | ICD-10-CM

## 2016-12-07 DIAGNOSIS — Z5112 Encounter for antineoplastic immunotherapy: Secondary | ICD-10-CM | POA: Diagnosis not present

## 2016-12-07 LAB — COMPREHENSIVE METABOLIC PANEL
ALT: 25 U/L (ref 0–55)
AST: 24 U/L (ref 5–34)
Albumin: 4 g/dL (ref 3.5–5.0)
Alkaline Phosphatase: 88 U/L (ref 40–150)
Anion Gap: 7 mEq/L (ref 3–11)
BUN: 12.4 mg/dL (ref 7.0–26.0)
CO2: 28 mEq/L (ref 22–29)
Calcium: 9.5 mg/dL (ref 8.4–10.4)
Chloride: 105 mEq/L (ref 98–109)
Creatinine: 0.7 mg/dL (ref 0.6–1.1)
EGFR: >90 mL/min/{1.73_m2} (ref >90)
Glucose: 102 mg/dl (ref 70–140)
Potassium: 3.8 mEq/L (ref 3.5–5.1)
Sodium: 141 mEq/L (ref 136–145)
Total Bilirubin: 0.56 mg/dL (ref 0.20–1.20)
Total Protein: 7 g/dL (ref 6.4–8.3)

## 2016-12-07 LAB — CBC WITH DIFFERENTIAL/PLATELET
BASO%: 0.8 % (ref 0.0–2.0)
Basophils Absolute: 0 10*3/uL (ref 0.0–0.1)
EOS%: 3.6 % (ref 0.0–7.0)
Eosinophils Absolute: 0.2 10*3/uL (ref 0.0–0.5)
HCT: 35.4 % (ref 34.8–46.6)
HGB: 12.1 g/dL (ref 11.6–15.9)
LYMPH%: 39 % (ref 14.0–49.7)
MCH: 30.9 pg (ref 25.1–34.0)
MCHC: 34.1 g/dL (ref 31.5–36.0)
MCV: 90.6 fL (ref 79.5–101.0)
MONO#: 0.5 10*3/uL (ref 0.1–0.9)
MONO%: 9.5 % (ref 0.0–14.0)
NEUT#: 2.5 10*3/uL (ref 1.5–6.5)
NEUT%: 47.1 % (ref 38.4–76.8)
Platelets: 242 10*3/uL (ref 145–400)
RBC: 3.91 10*6/uL (ref 3.70–5.45)
RDW: 13.5 % (ref 11.2–14.5)
WBC: 5.3 10*3/uL (ref 3.9–10.3)
lymph#: 2.1 10*3/uL (ref 0.9–3.3)

## 2016-12-07 MED ORDER — SODIUM CHLORIDE 0.9 % IV SOLN
Freq: Once | INTRAVENOUS | Status: AC
  Administered 2016-12-07: 10:00:00 via INTRAVENOUS

## 2016-12-07 MED ORDER — SODIUM CHLORIDE 0.9% FLUSH
10.0000 mL | Freq: Once | INTRAVENOUS | Status: AC
  Administered 2016-12-07: 10 mL via INTRAVENOUS
  Filled 2016-12-07: qty 10

## 2016-12-07 MED ORDER — TRASTUZUMAB CHEMO 150 MG IV SOLR
6.0000 mg/kg | Freq: Once | INTRAVENOUS | Status: AC
  Administered 2016-12-07: 462 mg via INTRAVENOUS
  Filled 2016-12-07: qty 22

## 2016-12-07 MED ORDER — DIPHENHYDRAMINE HCL 25 MG PO CAPS
ORAL_CAPSULE | ORAL | Status: AC
  Filled 2016-12-07: qty 2

## 2016-12-07 MED ORDER — FULVESTRANT 250 MG/5ML IM SOLN
500.0000 mg | INTRAMUSCULAR | Status: DC
  Administered 2016-12-07: 500 mg via INTRAMUSCULAR
  Filled 2016-12-07: qty 10

## 2016-12-07 MED ORDER — SODIUM CHLORIDE 0.9% FLUSH
10.0000 mL | INTRAVENOUS | Status: DC | PRN
  Administered 2016-12-07: 10 mL
  Filled 2016-12-07: qty 10

## 2016-12-07 MED ORDER — DIPHENHYDRAMINE HCL 25 MG PO CAPS
50.0000 mg | ORAL_CAPSULE | Freq: Once | ORAL | Status: AC
  Administered 2016-12-07: 50 mg via ORAL

## 2016-12-07 MED ORDER — ZOLEDRONIC ACID 4 MG/100ML IV SOLN
4.0000 mg | Freq: Once | INTRAVENOUS | Status: AC
  Administered 2016-12-07: 4 mg via INTRAVENOUS
  Filled 2016-12-07: qty 100

## 2016-12-07 MED ORDER — ACETAMINOPHEN 325 MG PO TABS
ORAL_TABLET | ORAL | Status: AC
  Filled 2016-12-07: qty 2

## 2016-12-07 MED ORDER — ACETAMINOPHEN 325 MG PO TABS
650.0000 mg | ORAL_TABLET | Freq: Once | ORAL | Status: AC
  Administered 2016-12-07: 650 mg via ORAL

## 2016-12-07 MED ORDER — HEPARIN SOD (PORK) LOCK FLUSH 100 UNIT/ML IV SOLN
500.0000 [IU] | Freq: Once | INTRAVENOUS | Status: AC | PRN
  Administered 2016-12-07: 500 [IU]
  Filled 2016-12-07: qty 5

## 2016-12-07 NOTE — Patient Instructions (Signed)

## 2016-12-07 NOTE — Progress Notes (Signed)
RN visit for port flush/labs

## 2017-01-04 ENCOUNTER — Other Ambulatory Visit (HOSPITAL_BASED_OUTPATIENT_CLINIC_OR_DEPARTMENT_OTHER): Payer: Managed Care, Other (non HMO)

## 2017-01-04 ENCOUNTER — Other Ambulatory Visit: Payer: Self-pay

## 2017-01-04 ENCOUNTER — Ambulatory Visit: Payer: Managed Care, Other (non HMO)

## 2017-01-04 ENCOUNTER — Ambulatory Visit (HOSPITAL_BASED_OUTPATIENT_CLINIC_OR_DEPARTMENT_OTHER): Payer: Managed Care, Other (non HMO)

## 2017-01-04 ENCOUNTER — Telehealth: Payer: Self-pay | Admitting: *Deleted

## 2017-01-04 ENCOUNTER — Other Ambulatory Visit: Payer: Self-pay | Admitting: *Deleted

## 2017-01-04 ENCOUNTER — Encounter: Payer: Self-pay | Admitting: Hematology and Oncology

## 2017-01-04 VITALS — BP 160/77 | HR 76 | Temp 98.6°F | Resp 20

## 2017-01-04 DIAGNOSIS — Z5111 Encounter for antineoplastic chemotherapy: Secondary | ICD-10-CM

## 2017-01-04 DIAGNOSIS — C7951 Secondary malignant neoplasm of bone: Secondary | ICD-10-CM

## 2017-01-04 DIAGNOSIS — Z5112 Encounter for antineoplastic immunotherapy: Secondary | ICD-10-CM

## 2017-01-04 DIAGNOSIS — C773 Secondary and unspecified malignant neoplasm of axilla and upper limb lymph nodes: Secondary | ICD-10-CM | POA: Diagnosis not present

## 2017-01-04 DIAGNOSIS — Z170 Estrogen receptor positive status [ER+]: Secondary | ICD-10-CM

## 2017-01-04 DIAGNOSIS — C50412 Malignant neoplasm of upper-outer quadrant of left female breast: Secondary | ICD-10-CM

## 2017-01-04 LAB — CBC WITH DIFFERENTIAL/PLATELET
BASO%: 0.8 % (ref 0.0–2.0)
Basophils Absolute: 0 10*3/uL (ref 0.0–0.1)
EOS%: 3 % (ref 0.0–7.0)
Eosinophils Absolute: 0.2 10*3/uL (ref 0.0–0.5)
HCT: 35.7 % (ref 34.8–46.6)
HGB: 12.3 g/dL (ref 11.6–15.9)
LYMPH%: 34.4 % (ref 14.0–49.7)
MCH: 30.9 pg (ref 25.1–34.0)
MCHC: 34.4 g/dL (ref 31.5–36.0)
MCV: 89.8 fL (ref 79.5–101.0)
MONO#: 0.5 10*3/uL (ref 0.1–0.9)
MONO%: 8.5 % (ref 0.0–14.0)
NEUT#: 3.1 10*3/uL (ref 1.5–6.5)
NEUT%: 53.3 % (ref 38.4–76.8)
Platelets: 248 10*3/uL (ref 145–400)
RBC: 3.97 10*6/uL (ref 3.70–5.45)
RDW: 13.7 % (ref 11.2–14.5)
WBC: 5.9 10*3/uL (ref 3.9–10.3)
lymph#: 2 10*3/uL (ref 0.9–3.3)

## 2017-01-04 LAB — COMPREHENSIVE METABOLIC PANEL
ALT: 25 U/L (ref 0–55)
AST: 23 U/L (ref 5–34)
Albumin: 4 g/dL (ref 3.5–5.0)
Alkaline Phosphatase: 103 U/L (ref 40–150)
Anion Gap: 8 mEq/L (ref 3–11)
BUN: 11.7 mg/dL (ref 7.0–26.0)
CO2: 26 mEq/L (ref 22–29)
Calcium: 9.4 mg/dL (ref 8.4–10.4)
Chloride: 106 mEq/L (ref 98–109)
Creatinine: 0.7 mg/dL (ref 0.6–1.1)
EGFR: >90 mL/min/{1.73_m2} (ref >90)
Glucose: 113 mg/dl (ref 70–140)
Potassium: 3.5 mEq/L (ref 3.5–5.1)
Sodium: 141 mEq/L (ref 136–145)
Total Bilirubin: 0.52 mg/dL (ref 0.20–1.20)
Total Protein: 7.1 g/dL (ref 6.4–8.3)

## 2017-01-04 MED ORDER — ACETAMINOPHEN 325 MG PO TABS
650.0000 mg | ORAL_TABLET | Freq: Once | ORAL | Status: AC
  Administered 2017-01-04: 650 mg via ORAL

## 2017-01-04 MED ORDER — TRASTUZUMAB CHEMO 150 MG IV SOLR
6.0000 mg/kg | Freq: Once | INTRAVENOUS | Status: AC
  Administered 2017-01-04: 462 mg via INTRAVENOUS
  Filled 2017-01-04: qty 22

## 2017-01-04 MED ORDER — FULVESTRANT 250 MG/5ML IM SOLN
500.0000 mg | INTRAMUSCULAR | Status: DC
  Administered 2017-01-04: 500 mg via INTRAMUSCULAR
  Filled 2017-01-04: qty 10

## 2017-01-04 MED ORDER — ACETAMINOPHEN 325 MG PO TABS
ORAL_TABLET | ORAL | Status: AC
  Filled 2017-01-04: qty 2

## 2017-01-04 MED ORDER — SODIUM CHLORIDE 0.9% FLUSH
10.0000 mL | INTRAVENOUS | Status: DC | PRN
  Administered 2017-01-04: 10 mL
  Filled 2017-01-04: qty 10

## 2017-01-04 MED ORDER — DIPHENHYDRAMINE HCL 25 MG PO CAPS
ORAL_CAPSULE | ORAL | Status: AC
  Filled 2017-01-04: qty 2

## 2017-01-04 MED ORDER — ZOLEDRONIC ACID 4 MG/100ML IV SOLN
4.0000 mg | Freq: Once | INTRAVENOUS | Status: AC
  Administered 2017-01-04: 4 mg via INTRAVENOUS
  Filled 2017-01-04: qty 100

## 2017-01-04 MED ORDER — PREDNISONE 50 MG PO TABS: ORAL_TABLET | ORAL | 0 refills | Status: DC

## 2017-01-04 MED ORDER — SODIUM CHLORIDE 0.9 % IV SOLN
Freq: Once | INTRAVENOUS | Status: AC
  Administered 2017-01-04: 10:00:00 via INTRAVENOUS

## 2017-01-04 MED ORDER — HEPARIN SOD (PORK) LOCK FLUSH 100 UNIT/ML IV SOLN
500.0000 [IU] | Freq: Once | INTRAVENOUS | Status: AC | PRN
  Administered 2017-01-04: 500 [IU]
  Filled 2017-01-04: qty 5

## 2017-01-04 MED ORDER — DIPHENHYDRAMINE HCL 25 MG PO CAPS
50.0000 mg | ORAL_CAPSULE | Freq: Once | ORAL | Status: AC
  Administered 2017-01-04: 50 mg via ORAL

## 2017-01-04 NOTE — Progress Notes (Signed)
After reviewing pt's account I found she is being billed for Faslodex.  I called pt to inform her of the copay assistance that's available for that drug.  She gave me consent to apply in her behalf so I enrolled her in the Faslodex Savings program.  She is approved for up to $6000.  Submitted claim today.  Waiting for approval.

## 2017-01-04 NOTE — Telephone Encounter (Signed)
Ok to proceed with herceptin today per ECHO Oct 2017 per Dr Jana Hakim, note pt is a stage 4 breast cancer.  Noted last ECHO obtained in [place].  This note will be sent to MD for further orders for ECHO.

## 2017-01-04 NOTE — Telephone Encounter (Signed)
See other entry 

## 2017-01-04 NOTE — Progress Notes (Signed)
Ok to proceed with treatment today per Tivis Ringer, RN per MD Magrinat with 08/02/16 ECHO

## 2017-01-04 NOTE — Patient Instructions (Addendum)
[facility] Discharge Instructions for Patients Receiving Chemotherapy  Today you received the following chemotherapy agents Herceptin  To help prevent nausea and vomiting after your treatment, we encourage you to take your nausea medication as directed.    If you develop nausea and vomiting that is not controlled by your nausea medication, call the clinic.   BELOW ARE SYMPTOMS THAT SHOULD BE REPORTED IMMEDIATELY:  *FEVER GREATER THAN 100.5 F  *CHILLS WITH OR WITHOUT FEVER  NAUSEA AND VOMITING THAT IS NOT CONTROLLED WITH YOUR NAUSEA MEDICATION  *UNUSUAL SHORTNESS OF BREATH  *UNUSUAL BRUISING OR BLEEDING  TENDERNESS IN MOUTH AND THROAT WITH OR WITHOUT PRESENCE OF ULCERS  *URINARY PROBLEMS  *BOWEL PROBLEMS  UNUSUAL RASH Items with * indicate a potential emergency and should be followed up as soon as possible.  Feel free to call the clinic you have any questions or concerns. The clinic phone number is [phone].  Please show the CHEMO ALERT CARD at check-in to the Emergency Department and triage nurse.  Zoledronic Acid injection (Hypercalcemia, Oncology) What is this medicine? ZOLEDRONIC ACID (ZOE le dron ik AS id) lowers the amount of calcium loss from bone. It is used to treat too much calcium in your blood from cancer. It is also used to prevent complications of cancer that has spread to the bone. This medicine may be used for other purposes; ask your health care provider or pharmacist if you have questions. COMMON BRAND NAME(S): Zometa What should I tell my health care provider before I take this medicine? They need to know if you have any of these conditions: -aspirin-sensitive asthma -cancer, especially if you are receiving medicines used to treat cancer -dental disease or wear dentures -infection -kidney disease -receiving corticosteroids like dexamethasone or prednisone -an unusual or allergic reaction to zoledronic acid, other medicines, foods,  dyes, or preservatives -pregnant or trying to get pregnant -breast-feeding How should I use this medicine? This medicine is for infusion into a vein. It is given by a health care professional in a hospital or clinic setting. Talk to your pediatrician regarding the use of this medicine in children. Special care may be needed. Overdosage: If you think you have taken too much of this medicine contact a poison control center or emergency room at once. NOTE: This medicine is only for you. Do not share this medicine with others. What if I miss a dose? It is important not to miss your dose. Call your doctor or health care professional if you are unable to keep an appointment. What may interact with this medicine? -certain antibiotics given by injection -NSAIDs, medicines for pain and inflammation, like ibuprofen or naproxen -some diuretics like bumetanide, furosemide -teriparatide -thalidomide This list may not describe all possible interactions. Give your health care provider a list of all the medicines, herbs, non-prescription drugs, or dietary supplements you use. Also tell them if you smoke, drink alcohol, or use illegal drugs. Some items may interact with your medicine. What should I watch for while using this medicine? Visit your doctor or health care professional for regular checkups. It may be some time before you see the benefit from this medicine. Do not stop taking your medicine unless your doctor tells you to. Your doctor may order blood tests or other tests to see how you are doing. Women should inform their doctor if they wish to become pregnant or think they might be pregnant. There is a potential for serious side effects to an unborn child. Talk to   your health care professional or pharmacist for more information. You should make sure that you get enough calcium and vitamin D while you are taking this medicine. Discuss the foods you eat and the vitamins you take with your health care  professional. Some people who take this medicine have severe bone, joint, and/or muscle pain. This medicine may also increase your risk for jaw problems or a broken thigh bone. Tell your doctor right away if you have severe pain in your jaw, bones, joints, or muscles. Tell your doctor if you have any pain that does not go away or that gets worse. Tell your dentist and dental surgeon that you are taking this medicine. You should not have major dental surgery while on this medicine. See your dentist to have a dental exam and fix any dental problems before starting this medicine. Take good care of your teeth while on this medicine. Make sure you see your dentist for regular follow-up appointments. What side effects may I notice from receiving this medicine? Side effects that you should report to your doctor or health care professional as soon as possible: -allergic reactions like skin rash, itching or hives, swelling of the face, lips, or tongue -anxiety, confusion, or depression -breathing problems -changes in vision -eye pain -feeling faint or lightheaded, falls -jaw pain, especially after dental work -mouth sores -muscle cramps, stiffness, or weakness -redness, blistering, peeling or loosening of the skin, including inside the mouth -trouble passing urine or change in the amount of urine Side effects that usually do not require medical attention (report to your doctor or health care professional if they continue or are bothersome): -bone, joint, or muscle pain -constipation -diarrhea -fever -hair loss -irritation at site where injected -loss of appetite -nausea, vomiting -stomach upset -trouble sleeping -trouble swallowing -weak or tired This list may not describe all possible side effects. Call your doctor for medical advice about side effects. You may report side effects to FDA at 1-800-FDA-1088. Where should I keep my medicine? This drug is given in a hospital or clinic and will not  be stored at home. NOTE: This sheet is a summary. It may not cover all possible information. If you have questions about this medicine, talk to your doctor, pharmacist, or health care provider.  2018 Elsevier/Gold Standard (2014-03-01 14:19:39)  Fulvestrant injection What is this medicine? FULVESTRANT (ful VES trant) blocks the effects of estrogen. It is used to treat breast cancer. This medicine may be used for other purposes; ask your health care provider or pharmacist if you have questions. COMMON BRAND NAME(S): FASLODEX What should I tell my health care provider before I take this medicine? They need to know if you have any of these conditions: -bleeding problems -liver disease -low levels of platelets in the blood -an unusual or allergic reaction to fulvestrant, other medicines, foods, dyes, or preservatives -pregnant or trying to get pregnant -breast-feeding How should I use this medicine? This medicine is for injection into a muscle. It is usually given by a health care professional in a hospital or clinic setting. Talk to your pediatrician regarding the use of this medicine in children. Special care may be needed. Overdosage: If you think you have taken too much of this medicine contact a poison control center or emergency room at once. NOTE: This medicine is only for you. Do not share this medicine with others. What if I miss a dose? It is important not to miss your dose. Call your doctor or health care professional if   you are unable to keep an appointment. What may interact with this medicine? -medicines that treat or prevent blood clots like warfarin, enoxaparin, and dalteparin This list may not describe all possible interactions. Give your health care provider a list of all the medicines, herbs, non-prescription drugs, or dietary supplements you use. Also tell them if you smoke, drink alcohol, or use illegal drugs. Some items may interact with your medicine. What should I  watch for while using this medicine? Your condition will be monitored carefully while you are receiving this medicine. You will need important blood work done while you are taking this medicine. Do not become pregnant while taking this medicine or for at least 1 year after stopping it. Women of child-bearing potential will need to have a negative pregnancy test before starting this medicine. Women should inform their doctor if they wish to become pregnant or think they might be pregnant. There is a potential for serious side effects to an unborn child. Men should inform their doctors if they wish to father a child. This medicine may lower sperm counts. Talk to your health care professional or pharmacist for more information. Do not breast-feed an infant while taking this medicine or for 1 year after the last dose. What side effects may I notice from receiving this medicine? Side effects that you should report to your doctor or health care professional as soon as possible: -allergic reactions like skin rash, itching or hives, swelling of the face, lips, or tongue -feeling faint or lightheaded, falls -pain, tingling, numbness, or weakness in the legs -signs and symptoms of infection like fever or chills; cough; flu-like symptoms; sore throat -vaginal bleeding Side effects that usually do not require medical attention (report to your doctor or health care professional if they continue or are bothersome): -aches, pains -constipation -diarrhea -headache -hot flashes -nausea, vomiting -pain at site where injected -stomach pain This list may not describe all possible side effects. Call your doctor for medical advice about side effects. You may report side effects to FDA at 1-800-FDA-1088. Where should I keep my medicine? This drug is given in a hospital or clinic and will not be stored at home. NOTE: This sheet is a summary. It may not cover all possible information. If you have questions about this  medicine, talk to your doctor, pharmacist, or health care provider.  2018 Elsevier/Gold Standard (2015-05-01 11:03:55)  

## 2017-01-17 ENCOUNTER — Telehealth: Payer: Self-pay

## 2017-01-17 NOTE — Telephone Encounter (Signed)
Returned pt call today to notify her that her prednisone for CT was already called and sent in her local drug store. She may pick it up today and follow instructions. Pt will need to take prednisone prior to her upcoming CT scan due to dye allergy. Pt verbalized understanding and will call for any further questions or concerns.

## 2017-01-19 ENCOUNTER — Other Ambulatory Visit: Payer: Self-pay | Admitting: Emergency Medicine

## 2017-01-19 DIAGNOSIS — Z170 Estrogen receptor positive status [ER+]: Secondary | ICD-10-CM

## 2017-01-19 DIAGNOSIS — C50412 Malignant neoplasm of upper-outer quadrant of left female breast: Secondary | ICD-10-CM

## 2017-01-20 ENCOUNTER — Telehealth: Payer: Self-pay | Admitting: Emergency Medicine

## 2017-01-20 NOTE — Telephone Encounter (Signed)
Patient called stating that she needed a referral for an Echo at the [facility] Card[facility] office. Order entered and message sent to scheduler to have this scheduled; called patient to advise her of this and for her to call for any further questions or concerns.

## 2017-01-24 ENCOUNTER — Ambulatory Visit (HOSPITAL_COMMUNITY)
Admission: RE | Admit: 2017-01-24 | Discharge: 2017-01-24 | Disposition: A | Discharge status: Home / Self Care | Payer: Managed Care, Other (non HMO) | Source: Ambulatory Visit | Attending: Hematology and Oncology | Admitting: Hematology and Oncology

## 2017-01-24 ENCOUNTER — Encounter (HOSPITAL_COMMUNITY): Payer: Self-pay

## 2017-01-24 ENCOUNTER — Encounter (HOSPITAL_COMMUNITY)
Admission: RE | Admit: 2017-01-24 | Discharge: 2017-01-24 | Disposition: A | Discharge status: Home / Self Care | Payer: Managed Care, Other (non HMO) | Source: Ambulatory Visit | Attending: Hematology and Oncology | Admitting: Hematology and Oncology

## 2017-01-24 DIAGNOSIS — C7951 Secondary malignant neoplasm of bone: Secondary | ICD-10-CM | POA: Diagnosis not present

## 2017-01-24 DIAGNOSIS — Z170 Estrogen receptor positive status [ER+]: Secondary | ICD-10-CM

## 2017-01-24 DIAGNOSIS — N289 Disorder of kidney and ureter, unspecified: Secondary | ICD-10-CM | POA: Insufficient documentation

## 2017-01-24 DIAGNOSIS — M799 Soft tissue disorder, unspecified: Secondary | ICD-10-CM | POA: Insufficient documentation

## 2017-01-24 DIAGNOSIS — C50412 Malignant neoplasm of upper-outer quadrant of left female breast: Secondary | ICD-10-CM | POA: Diagnosis not present

## 2017-01-24 MED ORDER — TECHNETIUM TC 99M MEDRONATE IV KIT
21.2000 | PACK | Freq: Once | INTRAVENOUS | Status: AC | PRN
  Administered 2017-01-24: 21.2 via INTRAVENOUS

## 2017-01-24 MED ORDER — IOPAMIDOL (ISOVUE-300) INJECTION 61%
INTRAVENOUS | Status: AC
  Administered 2017-01-24: 100 mL
  Filled 2017-01-24: qty 100

## 2017-01-31 ENCOUNTER — Ambulatory Visit (INDEPENDENT_AMBULATORY_CARE_PROVIDER_SITE_OTHER): Payer: Managed Care, Other (non HMO)

## 2017-01-31 ENCOUNTER — Other Ambulatory Visit: Payer: Self-pay

## 2017-01-31 DIAGNOSIS — Z170 Estrogen receptor positive status [ER+]: Secondary | ICD-10-CM | POA: Diagnosis not present

## 2017-01-31 DIAGNOSIS — C50412 Malignant neoplasm of upper-outer quadrant of left female breast: Secondary | ICD-10-CM | POA: Diagnosis not present

## 2017-02-01 ENCOUNTER — Ambulatory Visit (HOSPITAL_BASED_OUTPATIENT_CLINIC_OR_DEPARTMENT_OTHER): Payer: Managed Care, Other (non HMO) | Admitting: Hematology and Oncology

## 2017-02-01 ENCOUNTER — Other Ambulatory Visit (HOSPITAL_BASED_OUTPATIENT_CLINIC_OR_DEPARTMENT_OTHER): Payer: Managed Care, Other (non HMO)

## 2017-02-01 ENCOUNTER — Encounter: Payer: Self-pay | Admitting: Hematology and Oncology

## 2017-02-01 ENCOUNTER — Ambulatory Visit (HOSPITAL_BASED_OUTPATIENT_CLINIC_OR_DEPARTMENT_OTHER): Payer: Managed Care, Other (non HMO)

## 2017-02-01 ENCOUNTER — Ambulatory Visit: Payer: Managed Care, Other (non HMO)

## 2017-02-01 VITALS — BP 147/86 | HR 75 | Temp 98.7°F | Resp 18

## 2017-02-01 DIAGNOSIS — Z170 Estrogen receptor positive status [ER+]: Secondary | ICD-10-CM

## 2017-02-01 DIAGNOSIS — C773 Secondary and unspecified malignant neoplasm of axilla and upper limb lymph nodes: Secondary | ICD-10-CM

## 2017-02-01 DIAGNOSIS — C50412 Malignant neoplasm of upper-outer quadrant of left female breast: Secondary | ICD-10-CM

## 2017-02-01 DIAGNOSIS — Z5111 Encounter for antineoplastic chemotherapy: Secondary | ICD-10-CM | POA: Diagnosis not present

## 2017-02-01 DIAGNOSIS — R911 Solitary pulmonary nodule: Secondary | ICD-10-CM | POA: Diagnosis not present

## 2017-02-01 DIAGNOSIS — R229 Localized swelling, mass and lump, unspecified: Secondary | ICD-10-CM

## 2017-02-01 DIAGNOSIS — C7951 Secondary malignant neoplasm of bone: Secondary | ICD-10-CM | POA: Diagnosis not present

## 2017-02-01 DIAGNOSIS — M545 Low back pain: Secondary | ICD-10-CM | POA: Diagnosis not present

## 2017-02-01 DIAGNOSIS — Z5112 Encounter for antineoplastic immunotherapy: Secondary | ICD-10-CM

## 2017-02-01 DIAGNOSIS — K521 Toxic gastroenteritis and colitis: Secondary | ICD-10-CM

## 2017-02-01 LAB — CBC WITH DIFFERENTIAL/PLATELET
BASO%: 0.3 % (ref 0.0–2.0)
Basophils Absolute: 0 10*3/uL (ref 0.0–0.1)
EOS%: 2.8 % (ref 0.0–7.0)
Eosinophils Absolute: 0.2 10*3/uL (ref 0.0–0.5)
HCT: 36.9 % (ref 34.8–46.6)
HGB: 12.4 g/dL (ref 11.6–15.9)
LYMPH%: 26.3 % (ref 14.0–49.7)
MCH: 30.3 pg (ref 25.1–34.0)
MCHC: 33.6 g/dL (ref 31.5–36.0)
MCV: 90.2 fL (ref 79.5–101.0)
MONO#: 0.6 10*3/uL (ref 0.1–0.9)
MONO%: 6.7 % (ref 0.0–14.0)
NEUT#: 5.5 10*3/uL (ref 1.5–6.5)
NEUT%: 63.9 % (ref 38.4–76.8)
Platelets: 297 10*3/uL (ref 145–400)
RBC: 4.09 10*6/uL (ref 3.70–5.45)
RDW: 13.2 % (ref 11.2–14.5)
WBC: 8.6 10*3/uL (ref 3.9–10.3)
lymph#: 2.3 10*3/uL (ref 0.9–3.3)

## 2017-02-01 LAB — COMPREHENSIVE METABOLIC PANEL
ALT: 22 U/L (ref 0–55)
AST: 24 U/L (ref 5–34)
Albumin: 3.9 g/dL (ref 3.5–5.0)
Alkaline Phosphatase: 108 U/L (ref 40–150)
Anion Gap: 10 mEq/L (ref 3–11)
BUN: 9.3 mg/dL (ref 7.0–26.0)
CO2: 28 mEq/L (ref 22–29)
Calcium: 9.6 mg/dL (ref 8.4–10.4)
Chloride: 103 mEq/L (ref 98–109)
Creatinine: 0.7 mg/dL (ref 0.6–1.1)
EGFR: >90 mL/min/{1.73_m2} (ref >90)
Glucose: 123 mg/dl (ref 70–140)
Potassium: 3.5 mEq/L (ref 3.5–5.1)
Sodium: 141 mEq/L (ref 136–145)
Total Bilirubin: 0.51 mg/dL (ref 0.20–1.20)
Total Protein: 7.1 g/dL (ref 6.4–8.3)

## 2017-02-01 MED ORDER — DIPHENHYDRAMINE HCL 25 MG PO CAPS
50.0000 mg | ORAL_CAPSULE | Freq: Once | ORAL | Status: AC
  Administered 2017-02-01: 50 mg via ORAL

## 2017-02-01 MED ORDER — DIPHENHYDRAMINE HCL 25 MG PO CAPS
ORAL_CAPSULE | ORAL | Status: AC
  Filled 2017-02-01: qty 2

## 2017-02-01 MED ORDER — ACETAMINOPHEN 325 MG PO TABS
ORAL_TABLET | ORAL | Status: AC
  Filled 2017-02-01: qty 2

## 2017-02-01 MED ORDER — HEPARIN SOD (PORK) LOCK FLUSH 100 UNIT/ML IV SOLN
500.0000 [IU] | Freq: Once | INTRAVENOUS | Status: AC | PRN
  Administered 2017-02-01: 500 [IU]
  Filled 2017-02-01: qty 5

## 2017-02-01 MED ORDER — TRASTUZUMAB CHEMO 150 MG IV SOLR
6.0000 mg/kg | Freq: Once | INTRAVENOUS | Status: AC
  Administered 2017-02-01: 462 mg via INTRAVENOUS
  Filled 2017-02-01: qty 22

## 2017-02-01 MED ORDER — SODIUM CHLORIDE 0.9% FLUSH
10.0000 mL | INTRAVENOUS | Status: DC | PRN
  Administered 2017-02-01: 10 mL
  Filled 2017-02-01: qty 10

## 2017-02-01 MED ORDER — ZOLEDRONIC ACID 4 MG/100ML IV SOLN
4.0000 mg | Freq: Once | INTRAVENOUS | Status: AC
  Administered 2017-02-01: 4 mg via INTRAVENOUS
  Filled 2017-02-01: qty 100

## 2017-02-01 MED ORDER — ACETAMINOPHEN 325 MG PO TABS
650.0000 mg | ORAL_TABLET | Freq: Once | ORAL | Status: AC
  Administered 2017-02-01: 650 mg via ORAL

## 2017-02-01 MED ORDER — FULVESTRANT 250 MG/5ML IM SOLN
500.0000 mg | INTRAMUSCULAR | Status: DC
  Administered 2017-02-01: 500 mg via INTRAMUSCULAR
  Filled 2017-02-01: qty 10

## 2017-02-01 MED ORDER — SODIUM CHLORIDE 0.9 % IV SOLN
Freq: Once | INTRAVENOUS | Status: AC
  Administered 2017-02-01: 10:00:00 via INTRAVENOUS

## 2017-02-01 NOTE — Progress Notes (Signed)
Patient Care Team: [name], MD as PCP - General (Family Medicine)  DIAGNOSIS:  Encounter Diagnosis  Name Primary?  . Malignant neoplasm of upper-outer quadrant of left breast in female, estrogen receptor positive ([place])     SUMMARY OF ONCOLOGIC HISTORY:   Breast cancer of upper-outer quadrant of left female breast ([place])   11/14/2011 Surgery    Bilateral mastectomy, prophylactic on the right, left breast IDC 3/18 lymph nodes positive with extracapsular extension ER 89%, PR 81%, HER-2 negative, Ki-67 79% T2 N1 A. stage IIB      12/13/2011 - 06/28/2012 Chemotherapy    4 cycles of FEC followed by 4 cycles of Taxotere      07/17/2012 - 08/22/2012 Radiation Therapy    Adjuvant radiation therapy      08/22/2012 - 03/16/2016 Anti-estrogen oral therapy    Arimidex 1 mg daily      03/16/2016 Relapse/Recurrence    Subcutaneous nodule excision left chest: Infiltrating carcinoma breast primary, ER positive, PR negative      03/29/2016 Imaging    CT CAP and bone scan: Lytic lesions T8 vertebral, T1 posterior element, subcutaneous nodule left lateral chest wall, nonspecific lung nodules; Bone scan: Mets to kull, left humerus, left eighth rib, T7/T8, sternum, left acetabulum      04/28/2016 -  Chemotherapy    Herceptin, lapatinib, Faslodex, Zometa every 4 weeks       CHIEF COMPLIANT: Follow-up on Herceptin, lapatinib, Faslodex and Zometa  INTERVAL HISTORY: [name] Riley is a 66 year old with above-mentioned history metastatic breast cancer who is currently on above-mentioned treatment. She had recent scans which show stable disease. She has been tolerating the treatment fairly well. She does have occasional diarrhea. She also has intermittent low back pain.  REVIEW OF SYSTEMS:   Constitutional: Denies fevers, chills or abnormal weight loss Eyes: Denies blurriness of vision Ears, nose, mouth, throat, and face: Denies mucositis or sore throat Respiratory: Denies cough, dyspnea or  wheezes Cardiovascular: Denies palpitation, chest discomfort Gastrointestinal:  Denies nausea, heartburn or change in bowel habits Skin: Denies abnormal skin rashes Lymphatics: Denies new lymphadenopathy or easy bruising Neurological:Denies numbness, tingling or new weaknesses Behavioral/Psych: Mood is stable, no new changes  Extremities: No lower extremity edema  All other systems were reviewed with the patient and are negative.  I have reviewed the past medical history, past surgical history, social history and family history with the patient and they are unchanged from previous note.  ALLERGIES:  is allergic to contrast media [iodinated diagnostic agents] and food.  MEDICATIONS:  Current Outpatient Prescriptions  Medication Sig Dispense Refill  . acetaminophen (TYLENOL) 500 MG tablet Take 1,000 mg by mouth every 6 (six) hours as needed for moderate pain.    Marland Kitchen aspirin 81 MG tablet Take 81 mg by mouth daily.      . Cholecalciferol (VITAMIN D-3 PO) Take 2,000 Int'l Units by mouth daily.    Marland Kitchen doxycycline (VIBRAMYCIN) 100 MG capsule Take 1 capsule (100 mg total) by mouth 2 (two) times daily. 20 capsule 0  . lapatinib (TYKERB) 250 MG tablet TAKE 5 TABLETS (1250MG TOTAL) BY MOUTH DAILY 150 tablet 6  . levothyroxine (SYNTHROID, LEVOTHROID) 75 MCG tablet Take 75 mcg by mouth daily.     Marland Kitchen lidocaine-prilocaine (EMLA) cream Apply to affected area once 30 g 3  . metoprolol succinate (TOPROL-XL) 100 MG 24 hr tablet Take 1 tablet (100 mg total) by mouth daily. 90 tablet 3  . Multiple Vitamins-Minerals (MULTIVITAMIN WITH MINERALS) tablet Take  1 tablet by mouth daily.      . Multiple Vitamins-Minerals (THERA-M) TABS Take 1 tablet by mouth daily.    . pravastatin (PRAVACHOL) 80 MG tablet Take 80 mg by mouth daily.    . predniSONE (DELTASONE) 50 MG tablet Take 1 tablet 13, 7, and 1 hour prior to CT scan. 5 tablet 0  . Vitamin D, Ergocalciferol, (DRISDOL) 50000 units CAPS capsule Take 50,000 Units by  mouth once a week.     No current facility-administered medications for this visit.     PHYSICAL EXAMINATION: ECOG PERFORMANCE STATUS: 1 - Symptomatic but completely ambulatory  Vitals:   Filed Weights   02/01/17 1142  Weight: 161 lb (73 kg)    GENERAL:alert, no distress and comfortable SKIN: skin color, texture, turgor are normal, no rashes or significant lesions EYES: normal, Conjunctiva are pink and non-injected, sclera clear OROPHARYNX:no exudate, no erythema and lips, buccal mucosa, and tongue normal  NECK: supple, thyroid normal size, non-tender, without nodularity LYMPH:  no palpable lymphadenopathy in the cervical, axillary or inguinal LUNGS: clear to auscultation and percussion with normal breathing effort HEART: regular rate & rhythm and no murmurs and no lower extremity edema ABDOMEN:abdomen soft, non-tender and normal bowel sounds MUSCULOSKELETAL:no cyanosis of digits and no clubbing  NEURO: alert & oriented x 3 with fluent speech, no focal motor/sensory deficits EXTREMITIES: No lower extremity edema  LABORATORY DATA:  I have reviewed the data as listed   Chemistry      Component Value Date/Time   NA 141 02/01/2017 0855   K 3.5 02/01/2017 0855   CL 106 07/03/2016 0939   CL 105 08/10/2012 0908   CO2 28 02/01/2017 0855   BUN 9.3 02/01/2017 0855   CREATININE 0.7 02/01/2017 0855      Component Value Date/Time   CALCIUM 9.6 02/01/2017 0855   ALKPHOS 108 02/01/2017 0855   AST 24 02/01/2017 0855   ALT 22 02/01/2017 0855   BILITOT 0.51 02/01/2017 0855       Lab Results  Component Value Date   WBC 8.6 02/01/2017   HGB 12.4 02/01/2017   HCT 36.9 02/01/2017   MCV 90.2 02/01/2017   PLT 297 02/01/2017   NEUTROABS 5.5 02/01/2017    ASSESSMENT & PLAN:  Breast cancer of upper-outer quadrant of left female breast ([facility]) Left breast invasive ductal carcinomaT2, N1, M0 stage IIB 3 of 18 lymph nodes positive with extracapsular extension ER 89% PR 81% HER-2  negative Ki-67 79% status post 4 cycles of FEC and 4 cycles of Taxotere and adjuvant radiation. Was on Arimidex since 08/22/2012 to 03/30/16  Subcutaneous nodule excisionleft chest: Infiltrating carcinoma breast primary, ER positive, PR negative, HER-2 positive  CT CAP and bone scan06/13/2017: Lytic lesions T8 vertebral, T1 posterior element, subcutaneous nodule left lateral chest wall, nonspecific lung nodules; Bone scan: Mets to kull, left humerus, left eighth rib, T7/T8, sternum, left acetabulum. (In retrospect, original breast MRI in 2002 revealed sternal lesions concerning for metastatic disease, PET CT scan did not reveal metastases)  Goals of treatment: Palliation and prolongation of life. Patient understands that stage IV breast cancer cannot be cured but it can be managed with fairly long survivals.  Treatment Plan: Herceptin Q 4 weeks, Lapatinib, Faslodex, Zometa, started 04/28/2016, today is cycle 7  Toxicities: 1. Diarrhea from lapatinib 2. Injection site discomfort from Faslodex Lab work from today was reviewed.  CT CAP:  01/24/2017: Stable bone metastases, stable soft tissue nodules in the left lateral chest wall sclerotic bony  lesions and axial skeleton and bony pelvis stable  Radiology review: Patient Is thrilled to hear the results.  Echocardiogram 01/31/2017: EF 60-65% Patient will come every 4 weeks for injections and infusions and every 8 weeks to see me    I spent 25 minutes talking to the patient of which more than half was spent in counseling and coordination of care.  No orders of the defined types were placed in this encounter.  The patient has a good understanding of the overall plan. she agrees with it. she will call with any problems that may develop before the next visit here.   Rulon Eisenmenger, MD 02/01/17

## 2017-02-01 NOTE — Patient Instructions (Signed)
Zoledronic Acid injection (Hypercalcemia, Oncology) What is this medicine? ZOLEDRONIC ACID (ZOE le dron ik AS id) lowers the amount of calcium loss from bone. It is used to treat too much calcium in your blood from cancer. It is also used to prevent complications of cancer that has spread to the bone. This medicine may be used for other purposes; ask your health care provider or pharmacist if you have questions. COMMON BRAND NAME(S): Zometa What should I tell my health care provider before I take this medicine? They need to know if you have any of these conditions: -aspirin-sensitive asthma -cancer, especially if you are receiving medicines used to treat cancer -dental disease or wear dentures -infection -kidney disease -receiving corticosteroids like dexamethasone or prednisone -an unusual or allergic reaction to zoledronic acid, other medicines, foods, dyes, or preservatives -pregnant or trying to get pregnant -breast-feeding How should I use this medicine? This medicine is for infusion into a vein. It is given by a health care professional in a hospital or clinic setting. Talk to your pediatrician regarding the use of this medicine in children. Special care may be needed. Overdosage: If you think you have taken too much of this medicine contact a poison control center or emergency room at once. NOTE: This medicine is only for you. Do not share this medicine with others. What if I miss a dose? It is important not to miss your dose. Call your doctor or health care professional if you are unable to keep an appointment. What may interact with this medicine? -certain antibiotics given by injection -NSAIDs, medicines for pain and inflammation, like ibuprofen or naproxen -some diuretics like bumetanide, furosemide -teriparatide -thalidomide This list may not describe all possible interactions. Give your health care provider a list of all the medicines, herbs, non-prescription drugs, or  dietary supplements you use. Also tell them if you smoke, drink alcohol, or use illegal drugs. Some items may interact with your medicine. What should I watch for while using this medicine? Visit your doctor or health care professional for regular checkups. It may be some time before you see the benefit from this medicine. Do not stop taking your medicine unless your doctor tells you to. Your doctor may order blood tests or other tests to see how you are doing. Women should inform their doctor if they wish to become pregnant or think they might be pregnant. There is a potential for serious side effects to an unborn child. Talk to your health care professional or pharmacist for more information. You should make sure that you get enough calcium and vitamin D while you are taking this medicine. Discuss the foods you eat and the vitamins you take with your health care professional. Some people who take this medicine have severe bone, joint, and/or muscle pain. This medicine may also increase your risk for jaw problems or a broken thigh bone. Tell your doctor right away if you have severe pain in your jaw, bones, joints, or muscles. Tell your doctor if you have any pain that does not go away or that gets worse. Tell your dentist and dental surgeon that you are taking this medicine. You should not have major dental surgery while on this medicine. See your dentist to have a dental exam and fix any dental problems before starting this medicine. Take good care of your teeth while on this medicine. Make sure you see your dentist for regular follow-up appointments. What side effects may I notice from receiving this medicine? Side effects that   you should report to your doctor or health care professional as soon as possible: -allergic reactions like skin rash, itching or hives, swelling of the face, lips, or tongue -anxiety, confusion, or depression -breathing problems -changes in vision -eye pain -feeling faint or  lightheaded, falls -jaw pain, especially after dental work -mouth sores -muscle cramps, stiffness, or weakness -redness, blistering, peeling or loosening of the skin, including inside the mouth -trouble passing urine or change in the amount of urine Side effects that usually do not require medical attention (report to your doctor or health care professional if they continue or are bothersome): -bone, joint, or muscle pain -constipation -diarrhea -fever -hair loss -irritation at site where injected -loss of appetite -nausea, vomiting -stomach upset -trouble sleeping -trouble swallowing -weak or tired This list may not describe all possible side effects. Call your doctor for medical advice about side effects. You may report side effects to FDA at 1-800-FDA-1088. Where should I keep my medicine? This drug is given in a hospital or clinic and will not be stored at home. NOTE: This sheet is a summary. It may not cover all possible information. If you have questions about this medicine, talk to your doctor, pharmacist, or health care provider.  2018 Elsevier/Gold Standard (2014-03-01 14:19:39) Fulvestrant injection What is this medicine? FULVESTRANT (ful VES trant) blocks the effects of estrogen. It is used to treat breast cancer. This medicine may be used for other purposes; ask your health care provider or pharmacist if you have questions. COMMON BRAND NAME(S): FASLODEX What should I tell my health care provider before I take this medicine? They need to know if you have any of these conditions: -bleeding problems -liver disease -low levels of platelets in the blood -an unusual or allergic reaction to fulvestrant, other medicines, foods, dyes, or preservatives -pregnant or trying to get pregnant -breast-feeding How should I use this medicine? This medicine is for injection into a muscle. It is usually given by a health care professional in a hospital or clinic setting. Talk to your  pediatrician regarding the use of this medicine in children. Special care may be needed. Overdosage: If you think you have taken too much of this medicine contact a poison control center or emergency room at once. NOTE: This medicine is only for you. Do not share this medicine with others. What if I miss a dose? It is important not to miss your dose. Call your doctor or health care professional if you are unable to keep an appointment. What may interact with this medicine? -medicines that treat or prevent blood clots like warfarin, enoxaparin, and dalteparin This list may not describe all possible interactions. Give your health care provider a list of all the medicines, herbs, non-prescription drugs, or dietary supplements you use. Also tell them if you smoke, drink alcohol, or use illegal drugs. Some items may interact with your medicine. What should I watch for while using this medicine? Your condition will be monitored carefully while you are receiving this medicine. You will need important blood work done while you are taking this medicine. Do not become pregnant while taking this medicine or for at least 1 year after stopping it. Women of child-bearing potential will need to have a negative pregnancy test before starting this medicine. Women should inform their doctor if they wish to become pregnant or think they might be pregnant. There is a potential for serious side effects to an unborn child. Men should inform their doctors if they wish to father a  child. This medicine may lower sperm counts. Talk to your health care professional or pharmacist for more information. Do not breast-feed an infant while taking this medicine or for 1 year after the last dose. What side effects may I notice from receiving this medicine? Side effects that you should report to your doctor or health care professional as soon as possible: -allergic reactions like skin rash, itching or hives, swelling of the face, lips,  or tongue -feeling faint or lightheaded, falls -pain, tingling, numbness, or weakness in the legs -signs and symptoms of infection like fever or chills; cough; flu-like symptoms; sore throat -vaginal bleeding Side effects that usually do not require medical attention (report to your doctor or health care professional if they continue or are bothersome): -aches, pains -constipation -diarrhea -headache -hot flashes -nausea, vomiting -pain at site where injected -stomach pain This list may not describe all possible side effects. Call your doctor for medical advice about side effects. You may report side effects to FDA at 1-800-FDA-1088. Where should I keep my medicine? This drug is given in a hospital or clinic and will not be stored at home. NOTE: This sheet is a summary. It may not cover all possible information. If you have questions about this medicine, talk to your doctor, pharmacist, or health care provider.  2018 Elsevier/Gold Standard (2015-05-01 11:03:55) Trastuzumab injection for infusion What is this medicine? TRASTUZUMAB (tras TOO zoo mab) is a monoclonal antibody. It is used to treat breast cancer and stomach cancer. This medicine may be used for other purposes; ask your health care provider or pharmacist if you have questions. COMMON BRAND NAME(S): Herceptin What should I tell my health care provider before I take this medicine? They need to know if you have any of these conditions: -heart disease -heart failure -lung or breathing disease, like asthma -an unusual or allergic reaction to trastuzumab, benzyl alcohol, or other medications, foods, dyes, or preservatives -pregnant or trying to get pregnant -breast-feeding How should I use this medicine? This drug is given as an infusion into a vein. It is administered in a hospital or clinic by a specially trained health care professional. Talk to your pediatrician regarding the use of this medicine in children. This medicine  is not approved for use in children. Overdosage: If you think you have taken too much of this medicine contact a poison control center or emergency room at once. NOTE: This medicine is only for you. Do not share this medicine with others. What if I miss a dose? It is important not to miss a dose. Call your doctor or health care professional if you are unable to keep an appointment. What may interact with this medicine? This medicine may interact with the following medications: -certain types of chemotherapy, such as daunorubicin, doxorubicin, epirubicin, and idarubicin This list may not describe all possible interactions. Give your health care provider a list of all the medicines, herbs, non-prescription drugs, or dietary supplements you use. Also tell them if you smoke, drink alcohol, or use illegal drugs. Some items may interact with your medicine. What should I watch for while using this medicine? Visit your doctor for checks on your progress. Report any side effects. Continue your course of treatment even though you feel ill unless your doctor tells you to stop. Call your doctor or health care professional for advice if you get a fever, chills or sore throat, or other symptoms of a cold or flu. Do not treat yourself. Try to avoid being around people  who are sick. You may experience fever, chills and shaking during your first infusion. These effects are usually mild and can be treated with other medicines. Report any side effects during the infusion to your health care professional. Fever and chills usually do not happen with later infusions. Do not become pregnant while taking this medicine or for 7 months after stopping it. Women should inform their doctor if they wish to become pregnant or think they might be pregnant. Women of child-bearing potential will need to have a negative pregnancy test before starting this medicine. There is a potential for serious side effects to an unborn child. Talk to  your health care professional or pharmacist for more information. Do not breast-feed an infant while taking this medicine or for 7 months after stopping it. Women must use effective birth control with this medicine. What side effects may I notice from receiving this medicine? Side effects that you should report to your doctor or health care professional as soon as possible: -allergic reactions like skin rash, itching or hives, swelling of the face, lips, or tongue -chest pain or palpitations -cough -dizziness -feeling faint or lightheaded, falls -fever -general ill feeling or flu-like symptoms -signs of worsening heart failure like breathing problems; swelling in your legs and feet -unusually weak or tired Side effects that usually do not require medical attention (report to your doctor or health care professional if they continue or are bothersome): -bone pain -changes in taste -diarrhea -joint pain -nausea/vomiting -weight loss This list may not describe all possible side effects. Call your doctor for medical advice about side effects. You may report side effects to FDA at 1-800-FDA-1088. Where should I keep my medicine? This drug is given in a hospital or clinic and will not be stored at home. NOTE: This sheet is a summary. It may not cover all possible information. If you have questions about this medicine, talk to your doctor, pharmacist, or health care provider.  2018 Elsevier/Gold Standard (2016-09-27 14:37:52)

## 2017-02-01 NOTE — Assessment & Plan Note (Signed)
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 Was on Arimidex since 08/22/2012 to 03/30/16  Subcutaneous nodule excisionleft chest: Infiltrating carcinoma breast primary, ER positive, PR negative, HER-2 positive  CT CAP and bone scan06/13/2017: Lytic lesions T8 vertebral, T1 posterior element, subcutaneous nodule left lateral chest wall, nonspecific lung nodules; Bone scan: Mets to kull, left humerus, left eighth rib, T7/T8, sternum, left acetabulum. (In retrospect, original breast MRI in 2002 revealed sternal lesions concerning for metastatic disease, PET CT scan did not reveal metastases)  Goals of treatment: Palliation and prolongation of life. Patient understands that stage IV breast cancer cannot be cured but it can be managed with fairly long survivals.  Treatment Plan: Herceptin Q 4 weeks, Lapatinib, Faslodex, Zometa, started 04/28/2016, today is cycle 7  Toxicities: 1. Diarrhea from lapatinib 2. Injection site discomfort from Faslodex Lab work from today was reviewed.  CT CAP:  01/24/2017: Stable bone metastases, stable soft tissue nodules in the left lateral chest wall sclerotic bony lesions and axial skeleton and bony pelvis stable  Radiology review: Patient Is thrilled to hear the results.  Echocardiogram 01/31/2017: EF 60-65% Patient will come every 4 weeks for injections and infusions and every 8 weeks to see me

## 2017-02-28 ENCOUNTER — Ambulatory Visit (HOSPITAL_BASED_OUTPATIENT_CLINIC_OR_DEPARTMENT_OTHER): Payer: Managed Care, Other (non HMO)

## 2017-02-28 ENCOUNTER — Other Ambulatory Visit: Payer: Self-pay | Admitting: Emergency Medicine

## 2017-02-28 ENCOUNTER — Ambulatory Visit: Payer: Managed Care, Other (non HMO)

## 2017-02-28 VITALS — BP 163/92 | HR 77 | Temp 98.8°F | Resp 16

## 2017-02-28 DIAGNOSIS — C7951 Secondary malignant neoplasm of bone: Secondary | ICD-10-CM | POA: Diagnosis not present

## 2017-02-28 DIAGNOSIS — Z170 Estrogen receptor positive status [ER+]: Secondary | ICD-10-CM

## 2017-02-28 DIAGNOSIS — C50412 Malignant neoplasm of upper-outer quadrant of left female breast: Secondary | ICD-10-CM

## 2017-02-28 DIAGNOSIS — C773 Secondary and unspecified malignant neoplasm of axilla and upper limb lymph nodes: Secondary | ICD-10-CM

## 2017-02-28 DIAGNOSIS — Z5111 Encounter for antineoplastic chemotherapy: Secondary | ICD-10-CM | POA: Diagnosis not present

## 2017-02-28 DIAGNOSIS — Z5112 Encounter for antineoplastic immunotherapy: Secondary | ICD-10-CM | POA: Diagnosis not present

## 2017-02-28 LAB — BASIC METABOLIC PANEL
Anion Gap: 9 mEq/L (ref 3–11)
BUN: 8.9 mg/dL (ref 7.0–26.0)
CO2: 27 mEq/L (ref 22–29)
Calcium: 9.4 mg/dL (ref 8.4–10.4)
Chloride: 105 mEq/L (ref 98–109)
Creatinine: 0.6 mg/dL (ref 0.6–1.1)
EGFR: >90 mL/min/{1.73_m2} (ref >90)
Glucose: 96 mg/dl (ref 70–140)
Potassium: 3.7 mEq/L (ref 3.5–5.1)
Sodium: 141 mEq/L (ref 136–145)

## 2017-02-28 LAB — CBC WITH DIFFERENTIAL/PLATELET
BASO%: 0.8 % (ref 0.0–2.0)
Basophils Absolute: 0 10*3/uL (ref 0.0–0.1)
EOS%: 4.9 % (ref 0.0–7.0)
Eosinophils Absolute: 0.3 10*3/uL (ref 0.0–0.5)
HCT: 33.7 % — ABNORMAL LOW (ref 34.8–46.6)
HGB: 11.6 g/dL (ref 11.6–15.9)
LYMPH%: 33.3 % (ref 14.0–49.7)
MCH: 31 pg (ref 25.1–34.0)
MCHC: 34.3 g/dL (ref 31.5–36.0)
MCV: 90.4 fL (ref 79.5–101.0)
MONO#: 0.5 10*3/uL (ref 0.1–0.9)
MONO%: 9.7 % (ref 0.0–14.0)
NEUT#: 2.6 10*3/uL (ref 1.5–6.5)
NEUT%: 51.3 % (ref 38.4–76.8)
Platelets: 236 10*3/uL (ref 145–400)
RBC: 3.73 10*6/uL (ref 3.70–5.45)
RDW: 13.7 % (ref 11.2–14.5)
WBC: 5.2 10*3/uL (ref 3.9–10.3)
lymph#: 1.7 10*3/uL (ref 0.9–3.3)

## 2017-02-28 MED ORDER — HEPARIN SOD (PORK) LOCK FLUSH 100 UNIT/ML IV SOLN
500.0000 [IU] | Freq: Once | INTRAVENOUS | Status: AC | PRN
  Administered 2017-02-28: 500 [IU]
  Filled 2017-02-28: qty 5

## 2017-02-28 MED ORDER — DIPHENHYDRAMINE HCL 25 MG PO CAPS
50.0000 mg | ORAL_CAPSULE | Freq: Once | ORAL | Status: AC
  Administered 2017-02-28: 50 mg via ORAL

## 2017-02-28 MED ORDER — TRASTUZUMAB CHEMO INJECTION 440 MG
6.0000 mg/kg | Freq: Once | INTRAVENOUS | Status: DC
  Filled 2017-02-28: qty 22

## 2017-02-28 MED ORDER — SODIUM CHLORIDE 0.9% FLUSH
10.0000 mL | INTRAVENOUS | Status: DC | PRN
  Administered 2017-02-28: 10 mL
  Filled 2017-02-28: qty 10

## 2017-02-28 MED ORDER — SODIUM CHLORIDE 0.9 % IV SOLN
Freq: Once | INTRAVENOUS | Status: AC
  Administered 2017-02-28: 09:00:00 via INTRAVENOUS

## 2017-02-28 MED ORDER — ZOLEDRONIC ACID 4 MG/100ML IV SOLN
4.0000 mg | Freq: Once | INTRAVENOUS | Status: AC
  Administered 2017-02-28: 4 mg via INTRAVENOUS
  Filled 2017-02-28: qty 100

## 2017-02-28 MED ORDER — DIPHENHYDRAMINE HCL 25 MG PO CAPS
ORAL_CAPSULE | ORAL | Status: AC
  Filled 2017-02-28: qty 2

## 2017-02-28 MED ORDER — DIPHENHYDRAMINE HCL 25 MG PO CAPS
ORAL_CAPSULE | ORAL | Status: AC
  Filled 2017-02-28: qty 1

## 2017-02-28 MED ORDER — FULVESTRANT 250 MG/5ML IM SOLN
500.0000 mg | INTRAMUSCULAR | Status: DC
  Administered 2017-02-28: 500 mg via INTRAMUSCULAR
  Filled 2017-02-28: qty 10

## 2017-02-28 MED ORDER — ACETAMINOPHEN 325 MG PO TABS
ORAL_TABLET | ORAL | Status: AC
  Filled 2017-02-28: qty 2

## 2017-02-28 MED ORDER — ACETAMINOPHEN 325 MG PO TABS
650.0000 mg | ORAL_TABLET | Freq: Once | ORAL | Status: AC
  Administered 2017-02-28: 650 mg via ORAL

## 2017-02-28 MED ORDER — SODIUM CHLORIDE 0.9 % IV SOLN
6.0000 mg/kg | Freq: Once | INTRAVENOUS | Status: AC
  Administered 2017-02-28: 462 mg via INTRAVENOUS
  Filled 2017-02-28: qty 22

## 2017-02-28 NOTE — Patient Instructions (Signed)
[facility] Discharge Instructions for Patients Receiving Chemotherapy  Today you received the following chemotherapy agents Herceptin  To help prevent nausea and vomiting after your treatment, we encourage you to take your nausea medication as directed.    If you develop nausea and vomiting that is not controlled by your nausea medication, call the clinic.   BELOW ARE SYMPTOMS THAT SHOULD BE REPORTED IMMEDIATELY:  *FEVER GREATER THAN 100.5 F  *CHILLS WITH OR WITHOUT FEVER  NAUSEA AND VOMITING THAT IS NOT CONTROLLED WITH YOUR NAUSEA MEDICATION  *UNUSUAL SHORTNESS OF BREATH  *UNUSUAL BRUISING OR BLEEDING  TENDERNESS IN MOUTH AND THROAT WITH OR WITHOUT PRESENCE OF ULCERS  *URINARY PROBLEMS  *BOWEL PROBLEMS  UNUSUAL RASH Items with * indicate a potential emergency and should be followed up as soon as possible.  Feel free to call the clinic you have any questions or concerns. The clinic phone number is [phone].  Please show the CHEMO ALERT CARD at check-in to the Emergency Department and triage nurse.  Zoledronic Acid injection (Hypercalcemia, Oncology) What is this medicine? ZOLEDRONIC ACID (ZOE le dron ik AS id) lowers the amount of calcium loss from bone. It is used to treat too much calcium in your blood from cancer. It is also used to prevent complications of cancer that has spread to the bone. This medicine may be used for other purposes; ask your health care provider or pharmacist if you have questions. COMMON BRAND NAME(S): Zometa What should I tell my health care provider before I take this medicine? They need to know if you have any of these conditions: -aspirin-sensitive asthma -cancer, especially if you are receiving medicines used to treat cancer -dental disease or wear dentures -infection -kidney disease -receiving corticosteroids like dexamethasone or prednisone -an unusual or allergic reaction to zoledronic acid, other medicines, foods,  dyes, or preservatives -pregnant or trying to get pregnant -breast-feeding How should I use this medicine? This medicine is for infusion into a vein. It is given by a health care professional in a hospital or clinic setting. Talk to your pediatrician regarding the use of this medicine in children. Special care may be needed. Overdosage: If you think you have taken too much of this medicine contact a poison control center or emergency room at once. NOTE: This medicine is only for you. Do not share this medicine with others. What if I miss a dose? It is important not to miss your dose. Call your doctor or health care professional if you are unable to keep an appointment. What may interact with this medicine? -certain antibiotics given by injection -NSAIDs, medicines for pain and inflammation, like ibuprofen or naproxen -some diuretics like bumetanide, furosemide -teriparatide -thalidomide This list may not describe all possible interactions. Give your health care provider a list of all the medicines, herbs, non-prescription drugs, or dietary supplements you use. Also tell them if you smoke, drink alcohol, or use illegal drugs. Some items may interact with your medicine. What should I watch for while using this medicine? Visit your doctor or health care professional for regular checkups. It may be some time before you see the benefit from this medicine. Do not stop taking your medicine unless your doctor tells you to. Your doctor may order blood tests or other tests to see how you are doing. Women should inform their doctor if they wish to become pregnant or think they might be pregnant. There is a potential for serious side effects to an unborn child. Talk to   your health care professional or pharmacist for more information. You should make sure that you get enough calcium and vitamin D while you are taking this medicine. Discuss the foods you eat and the vitamins you take with your health care  professional. Some people who take this medicine have severe bone, joint, and/or muscle pain. This medicine may also increase your risk for jaw problems or a broken thigh bone. Tell your doctor right away if you have severe pain in your jaw, bones, joints, or muscles. Tell your doctor if you have any pain that does not go away or that gets worse. Tell your dentist and dental surgeon that you are taking this medicine. You should not have major dental surgery while on this medicine. See your dentist to have a dental exam and fix any dental problems before starting this medicine. Take good care of your teeth while on this medicine. Make sure you see your dentist for regular follow-up appointments. What side effects may I notice from receiving this medicine? Side effects that you should report to your doctor or health care professional as soon as possible: -allergic reactions like skin rash, itching or hives, swelling of the face, lips, or tongue -anxiety, confusion, or depression -breathing problems -changes in vision -eye pain -feeling faint or lightheaded, falls -jaw pain, especially after dental work -mouth sores -muscle cramps, stiffness, or weakness -redness, blistering, peeling or loosening of the skin, including inside the mouth -trouble passing urine or change in the amount of urine Side effects that usually do not require medical attention (report to your doctor or health care professional if they continue or are bothersome): -bone, joint, or muscle pain -constipation -diarrhea -fever -hair loss -irritation at site where injected -loss of appetite -nausea, vomiting -stomach upset -trouble sleeping -trouble swallowing -weak or tired This list may not describe all possible side effects. Call your doctor for medical advice about side effects. You may report side effects to FDA at 1-800-FDA-1088. Where should I keep my medicine? This drug is given in a hospital or clinic and will not  be stored at home. NOTE: This sheet is a summary. It may not cover all possible information. If you have questions about this medicine, talk to your doctor, pharmacist, or health care provider.  2018 Elsevier/Gold Standard (2014-03-01 14:19:39)  Fulvestrant injection What is this medicine? FULVESTRANT (ful VES trant) blocks the effects of estrogen. It is used to treat breast cancer. This medicine may be used for other purposes; ask your health care provider or pharmacist if you have questions. COMMON BRAND NAME(S): FASLODEX What should I tell my health care provider before I take this medicine? They need to know if you have any of these conditions: -bleeding problems -liver disease -low levels of platelets in the blood -an unusual or allergic reaction to fulvestrant, other medicines, foods, dyes, or preservatives -pregnant or trying to get pregnant -breast-feeding How should I use this medicine? This medicine is for injection into a muscle. It is usually given by a health care professional in a hospital or clinic setting. Talk to your pediatrician regarding the use of this medicine in children. Special care may be needed. Overdosage: If you think you have taken too much of this medicine contact a poison control center or emergency room at once. NOTE: This medicine is only for you. Do not share this medicine with others. What if I miss a dose? It is important not to miss your dose. Call your doctor or health care professional if   you are unable to keep an appointment. What may interact with this medicine? -medicines that treat or prevent blood clots like warfarin, enoxaparin, and dalteparin This list may not describe all possible interactions. Give your health care provider a list of all the medicines, herbs, non-prescription drugs, or dietary supplements you use. Also tell them if you smoke, drink alcohol, or use illegal drugs. Some items may interact with your medicine. What should I  watch for while using this medicine? Your condition will be monitored carefully while you are receiving this medicine. You will need important blood work done while you are taking this medicine. Do not become pregnant while taking this medicine or for at least 1 year after stopping it. Women of child-bearing potential will need to have a negative pregnancy test before starting this medicine. Women should inform their doctor if they wish to become pregnant or think they might be pregnant. There is a potential for serious side effects to an unborn child. Men should inform their doctors if they wish to father a child. This medicine may lower sperm counts. Talk to your health care professional or pharmacist for more information. Do not breast-feed an infant while taking this medicine or for 1 year after the last dose. What side effects may I notice from receiving this medicine? Side effects that you should report to your doctor or health care professional as soon as possible: -allergic reactions like skin rash, itching or hives, swelling of the face, lips, or tongue -feeling faint or lightheaded, falls -pain, tingling, numbness, or weakness in the legs -signs and symptoms of infection like fever or chills; cough; flu-like symptoms; sore throat -vaginal bleeding Side effects that usually do not require medical attention (report to your doctor or health care professional if they continue or are bothersome): -aches, pains -constipation -diarrhea -headache -hot flashes -nausea, vomiting -pain at site where injected -stomach pain This list may not describe all possible side effects. Call your doctor for medical advice about side effects. You may report side effects to FDA at 1-800-FDA-1088. Where should I keep my medicine? This drug is given in a hospital or clinic and will not be stored at home. NOTE: This sheet is a summary. It may not cover all possible information. If you have questions about this  medicine, talk to your doctor, pharmacist, or health care provider.  2018 Elsevier/Gold Standard (2015-05-01 11:03:55)  

## 2017-02-28 NOTE — Progress Notes (Signed)
BP elevated at 163/92. Patient reported that she has not taken her BP medication but has it with her. She normally takes it with breakfast but forgot and requested to take it during visit. Ok'd by this Therapist, sports. Patient took100mg  Metoprolol and 81mg  Aspirin.  Wylene Simmer, BSN, RN 02/28/2017 8:59 AM

## 2017-03-06 ENCOUNTER — Ambulatory Visit (HOSPITAL_BASED_OUTPATIENT_CLINIC_OR_DEPARTMENT_OTHER): Payer: Managed Care, Other (non HMO)

## 2017-03-06 ENCOUNTER — Encounter: Payer: Self-pay | Admitting: Oncology

## 2017-03-06 ENCOUNTER — Ambulatory Visit (HOSPITAL_BASED_OUTPATIENT_CLINIC_OR_DEPARTMENT_OTHER): Payer: Managed Care, Other (non HMO) | Admitting: Oncology

## 2017-03-06 ENCOUNTER — Telehealth: Payer: Self-pay | Admitting: *Deleted

## 2017-03-06 VITALS — BP 175/89 | HR 77 | Temp 98.7°F | Resp 16 | Ht 68.0 in | Wt 160.2 lb

## 2017-03-06 DIAGNOSIS — C50412 Malignant neoplasm of upper-outer quadrant of left female breast: Secondary | ICD-10-CM

## 2017-03-06 DIAGNOSIS — C7951 Secondary malignant neoplasm of bone: Secondary | ICD-10-CM

## 2017-03-06 DIAGNOSIS — C773 Secondary and unspecified malignant neoplasm of axilla and upper limb lymph nodes: Secondary | ICD-10-CM

## 2017-03-06 DIAGNOSIS — N6459 Other signs and symptoms in breast: Secondary | ICD-10-CM | POA: Diagnosis not present

## 2017-03-06 DIAGNOSIS — Z170 Estrogen receptor positive status [ER+]: Secondary | ICD-10-CM

## 2017-03-06 DIAGNOSIS — N63 Unspecified lump in unspecified breast: Secondary | ICD-10-CM | POA: Insufficient documentation

## 2017-03-06 LAB — COMPREHENSIVE METABOLIC PANEL
ALT: 26 U/L (ref 0–55)
AST: 28 U/L (ref 5–34)
Albumin: 4.2 g/dL (ref 3.5–5.0)
Alkaline Phosphatase: 115 U/L (ref 40–150)
Anion Gap: 12 mEq/L — ABNORMAL HIGH (ref 3–11)
BUN: 9.5 mg/dL (ref 7.0–26.0)
CO2: 29 mEq/L (ref 22–29)
Calcium: 9.8 mg/dL (ref 8.4–10.4)
Chloride: 104 mEq/L (ref 98–109)
Creatinine: 0.7 mg/dL (ref 0.6–1.1)
EGFR: >90 mL/min/{1.73_m2} (ref >90)
Glucose: 85 mg/dl (ref 70–140)
Potassium: 3.5 mEq/L (ref 3.5–5.1)
Sodium: 145 mEq/L (ref 136–145)
Total Bilirubin: 0.47 mg/dL (ref 0.20–1.20)
Total Protein: 7.7 g/dL (ref 6.4–8.3)

## 2017-03-06 LAB — CBC WITH DIFFERENTIAL/PLATELET
BASO%: 0.7 % (ref 0.0–2.0)
Basophils Absolute: 0 10*3/uL (ref 0.0–0.1)
EOS%: 6.3 % (ref 0.0–7.0)
Eosinophils Absolute: 0.4 10*3/uL (ref 0.0–0.5)
HCT: 36.9 % (ref 34.8–46.6)
HGB: 12.5 g/dL (ref 11.6–15.9)
LYMPH%: 34.2 % (ref 14.0–49.7)
MCH: 30.7 pg (ref 25.1–34.0)
MCHC: 33.9 g/dL (ref 31.5–36.0)
MCV: 90.4 fL (ref 79.5–101.0)
MONO#: 0.5 10*3/uL (ref 0.1–0.9)
MONO%: 8.5 % (ref 0.0–14.0)
NEUT#: 3.1 10*3/uL (ref 1.5–6.5)
NEUT%: 50.3 % (ref 38.4–76.8)
Platelets: 281 10*3/uL (ref 145–400)
RBC: 4.08 10*6/uL (ref 3.70–5.45)
RDW: 13.8 % (ref 11.2–14.5)
WBC: 6.1 10*3/uL (ref 3.9–10.3)
lymph#: 2.1 10*3/uL (ref 0.9–3.3)

## 2017-03-06 NOTE — Patient Instructions (Signed)
Wash with soap and water daily. Pat dry. Apply Bacitracin cream and dressing daily until healed.

## 2017-03-06 NOTE — Telephone Encounter (Signed)
"  I received Herceptin Tuesday.  This morning I woke up with blood on my P.J.'s, sheets and in the shower, blood oozing from the left scar tissue that's always bulged since the bilateral mastectomy in 2014 by Dr. [name].  I put pressure and gauze on it.  Bleeding has stopped.  I lost perhaps two tablespoons total of blood.  The area is not red, swollen but it had some red places that itched below the scar tissue so I put hydrocortisone lotion to this area Friday and Saturday so it no longer itches.  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  Last week I was home planting in my gardening.  Do I need to come in to have someone in symptom management look at it?  It will take me two hours to get there.  Call my cell number [phone]."

## 2017-03-06 NOTE — Telephone Encounter (Signed)
Called patient to come in to [facility] clinic for evaluation of left breast area.  Confirmed two hour need for arrival.  Asked she arrive for lab at 11:15 am and [facility] at 11:45.  Scheduling message sent.  Requested CBC-diff and CMET per [facility] protocol.

## 2017-03-06 NOTE — Progress Notes (Signed)
SYMPTOM MANAGEMENT CLINIC    Chief Complaint: Bleeding sore near left breast  HPI:  [name] Riley 66 y.o. female diagnosed with metastatic breast cancer currently being treated with Herceptin, lapatinib, Faslodex, and Zometa. Patient called our office this morning and noted that she had some bleeding from an area near her left breast. The patient tells me that she is only had lumps near her incision and has been told that these are scar tissue. She is also oh is had scabs on some these areas. During the night, one of these areas open up and she noted some blood on her nightgown and she eats. The area stopped bleeding and she placed a dressing over top of it. She came in today to be evaluated further for this. She denies any fevers, chills, chest pain, shortness of breath. No other skin lesions noted. No new lumps near her incision sites.    Breast cancer of upper-outer quadrant of left female breast ([place])   11/14/2011 Surgery    Bilateral mastectomy, prophylactic on the right, left breast IDC 3/18 lymph nodes positive with extracapsular extension ER 89%, PR 81%, HER-2 negative, Ki-67 79% T2 N1 A. stage IIB      12/13/2011 - 06/28/2012 Chemotherapy    4 cycles of FEC followed by 4 cycles of Taxotere      07/17/2012 - 08/22/2012 Radiation Therapy    Adjuvant radiation therapy      08/22/2012 - 03/16/2016 Anti-estrogen oral therapy    Arimidex 1 mg daily      03/16/2016 Relapse/Recurrence    Subcutaneous nodule excision left chest: Infiltrating carcinoma breast primary, ER positive, PR negative      03/29/2016 Imaging    CT CAP and bone scan: Lytic lesions T8 vertebral, T1 posterior element, subcutaneous nodule left lateral chest wall, nonspecific lung nodules; Bone scan: Mets to kull, left humerus, left eighth rib, T7/T8, sternum, left acetabulum      04/28/2016 -  Chemotherapy    Herceptin, lapatinib, Faslodex, Zometa every 4 weeks       Review of Systems  Constitutional: Negative.    Respiratory: Negative.   Cardiovascular: Negative.   Musculoskeletal: Negative.   Skin:       Bleeding sore near her left mastectomy incision.  Neurological: Negative.   Psychiatric/Behavioral: Negative.     Past Medical History:  Diagnosis Date  . Allergy   . Breast cancer ([place]) 08/25/2011   L , invasive ductal carcinoma, ER/PR +,HER2 -  . Cancer [facility])    left breast cancer  . Coronary artery disease 2001  . Heart attack [facility]) 09/2000   Sep 25, 2000  --no intervention  . History of chemotherapy comp. 08/22/2012   4 cycles of FEC and $ cycles of Taxotere  . Hyperlipidemia   . Hypertension   . Hypothyroidism   . PONV (postoperative nausea and vomiting)    gets sick from anesthesia  . Status post radiation therapy 07/09/12 - 08/22/2012   Left Breast, 60.4 gray    Past Surgical History:  Procedure Laterality Date  . ABDOMINAL HYSTERECTOMY  1998   TAH, oophorectomy  . APPENDECTOMY  1970  . BREAST SURGERY  1998   removal of benign lump in rt breast  . BREAST SURGERY  11/14/11   right simple mastectomy, left mrm  . MASTECTOMY Bilateral    for left breast cancer  . OVARIAN CYST SURGERY Right 1970  . PORT-A-CATH REMOVAL  08/30/2012   Procedure: REMOVAL PORT-A-CATH;  Surgeon: Imogene Burn.  [name], MD;  Location: [place];  Service: General;  Laterality: Right;  port removal  . PORTACATH PLACEMENT  11/14/2011   Procedure: INSERTION PORT-A-CATH;  Surgeon: Imogene Burn. [name], MD;  Location: [place];  Service: General;  Laterality: Right;  . PORTACATH PLACEMENT N/A 04/25/2016   Procedure: INSERTION PORT-A-CATH LEFT CHEST;  Surgeon: Donnie Mesa, MD;  Location: [place];  Service: General;  Laterality: N/A;  . skin tags  05/09/1997   left axillary left neck skin tags  . TONSILLECTOMY  1968    has Hypothyroidism; HYPERLIPIDEMIA; Essential hypertension; Coronary atherosclerosis; DIZZINESS; PALPITATIONS; Breast cancer of upper-outer quadrant of left female  breast ([place]); Heart attack ([place]); Hypertension; Radiation-induced dermatitis; Chest pain; Pneumothorax on left; Metastasis to bone [facility]); and Abscess, abdomen on her problem list.    is allergic to contrast media [iodinated diagnostic agents] and food.  Allergies as of 03/06/2017      Reactions   Contrast Media [iodinated Diagnostic Agents] Shortness Of Breath, Rash   Food Shortness Of Breath   Cantaloupe      Medication List       Accurate as of 03/06/17  4:45 PM. Always use your most recent med list.          acetaminophen 500 MG tablet Commonly known as:  TYLENOL Take 1,000 mg by mouth every 6 (six) hours as needed for moderate pain.   aspirin 81 MG tablet Take 81 mg by mouth daily.   doxycycline 100 MG capsule Commonly known as:  VIBRAMYCIN Take 1 capsule (100 mg total) by mouth 2 (two) times daily.   lapatinib 250 MG tablet Commonly known as:  TYKERB TAKE 5 TABLETS (1250MG TOTAL) BY MOUTH DAILY   levothyroxine 75 MCG tablet Commonly known as:  SYNTHROID, LEVOTHROID Take 75 mcg by mouth daily.   lidocaine-prilocaine cream Commonly known as:  EMLA Apply to affected area once   metoprolol succinate 100 MG 24 hr tablet Commonly known as:  TOPROL-XL Take 1 tablet (100 mg total) by mouth daily.   multivitamin with minerals tablet Take 1 tablet by mouth daily.   THERA-M Tabs Take 1 tablet by mouth daily.   pravastatin 80 MG tablet Commonly known as:  PRAVACHOL Take 80 mg by mouth daily.   predniSONE 50 MG tablet Commonly known as:  DELTASONE Take 1 tablet 13, 7, and 1 hour prior to CT scan.   Vitamin D (Ergocalciferol) 50000 units Caps capsule Commonly known as:  DRISDOL Take 50,000 Units by mouth once a week.   VITAMIN D-3 PO Take 2,000 Int'l Units by mouth daily.        PHYSICAL EXAMINATION  Oncology Vitals 03/06/2017 02/28/2017  Height 173 cm -  Weight 72.666 kg -  Weight (lbs) 160 lbs 3 oz -  BMI (kg/m2) 24.36 kg/m2 -  Temp 98.7 98.8  Pulse  77 77  Resp 16 16  Resp (Historical as of 05/17/12) - -  SpO2 100 100  BSA (m2) 1.87 m2 -   BP Readings from Last 2 Encounters:  03/06/17 (!) 175/89  02/28/17 (!) 163/92    Physical Exam  Constitutional: She is oriented to person, place, and time and well-developed, well-nourished, and in no distress. No distress.  HENT:  Head: Normocephalic.  Neurological: She is alert and oriented to person, place, and time.  Skin: She is not diaphoretic.  Approximately 1 cm raised area just behind her left breast near the incision. There is no drainage or blood noted during  the exam today. The area does appear as though a scab has fallen off of it recently.  Psychiatric: Mood, memory, affect and judgment normal.  Vitals reviewed.   LABORATORY DATA:. Appointment on 03/06/2017  Component Date Value Ref Range Status  . WBC 03/06/2017 6.1  3.9 - 10.3 10e3/uL Final  . NEUT# 03/06/2017 3.1  1.5 - 6.5 10e3/uL Final  . HGB 03/06/2017 12.5  11.6 - 15.9 g/dL Final  . HCT 03/06/2017 36.9  34.8 - 46.6 % Final  . Platelets 03/06/2017 281  145 - 400 10e3/uL Final  . MCV 03/06/2017 90.4  79.5 - 101.0 fL Final  . MCH 03/06/2017 30.7  25.1 - 34.0 pg Final  . MCHC 03/06/2017 33.9  31.5 - 36.0 g/dL Final  . RBC 03/06/2017 4.08  3.70 - 5.45 10e6/uL Final  . RDW 03/06/2017 13.8  11.2 - 14.5 % Final  . lymph# 03/06/2017 2.1  0.9 - 3.3 10e3/uL Final  . MONO# 03/06/2017 0.5  0.1 - 0.9 10e3/uL Final  . Eosinophils Absolute 03/06/2017 0.4  0.0 - 0.5 10e3/uL Final  . Basophils Absolute 03/06/2017 0.0  0.0 - 0.1 10e3/uL Final  . NEUT% 03/06/2017 50.3  38.4 - 76.8 % Final  . LYMPH% 03/06/2017 34.2  14.0 - 49.7 % Final  . MONO% 03/06/2017 8.5  0.0 - 14.0 % Final  . EOS% 03/06/2017 6.3  0.0 - 7.0 % Final  . BASO% 03/06/2017 0.7  0.0 - 2.0 % Final  . Sodium 03/06/2017 145  136 - 145 mEq/L Final  . Potassium 03/06/2017 3.5  3.5 - 5.1 mEq/L Final  . Chloride 03/06/2017 104  98 - 109 mEq/L Final  . CO2 03/06/2017 29  22  - 29 mEq/L Final  . Glucose 03/06/2017 85  70 - 140 mg/dl Final   Glucose reference range is for nonfasting patients. Fasting glucose reference range is 70- 100.  Marland Kitchen BUN 03/06/2017 9.5  7.0 - 26.0 mg/dL Final  . Creatinine 03/06/2017 0.7  0.6 - 1.1 mg/dL Final  . Total Bilirubin 03/06/2017 0.47  0.20 - 1.20 mg/dL Final  . Alkaline Phosphatase 03/06/2017 115  40 - 150 U/L Final  . AST 03/06/2017 28  5 - 34 U/L Final  . ALT 03/06/2017 26  0 - 55 U/L Final  . Total Protein 03/06/2017 7.7  6.4 - 8.3 g/dL Final  . Albumin 03/06/2017 4.2  3.5 - 5.0 g/dL Final  . Calcium 03/06/2017 9.8  8.4 - 10.4 mg/dL Final  . Anion Gap 03/06/2017 12* 3 - 11 mEq/L Final  . EGFR 03/06/2017 >90  >90 ml/min/1.73 m2 Final   eGFR is calculated using the CKD-EPI Creatinine Equation (2009)    RADIOGRAPHIC STUDIES: No results found.  ASSESSMENT/PLAN:    No problem-specific Assessment & Plan notes found for this encounter.  This is a 66 year old female with metastatic breast cancer. She is currently receiving treatment with Herceptin, lapatinib, Faslodex, and Zometa. She is tolerating her treatment well and the last CT scan performed in April 2018 showed stable disease. She now has small amount of bleeding noted from a lesion near her left mastectomy scar. She has been told in the past that this is scar tissue. Area does not look infected is a. We discussed wound care including washing this area with soap and water and padding it dry. She made an apply bacitracin cream to this area and cover with a dry dressing. She was instructed to change his dressing daily.  The patient will let  us know if this area increases in size or has increased bleeding. This will be reevaluated at her next visit in approximately 3 weeks or sooner if needed.   Patient stated understanding of all instructions; and was in agreement with this plan of care. The patient knows to call the clinic with any problems, questions or concerns.      Mikey Bussing, NP 03/06/2017

## 2017-03-10 ENCOUNTER — Ambulatory Visit: Payer: Managed Care, Other (non HMO) | Admitting: Hematology and Oncology

## 2017-03-28 NOTE — Assessment & Plan Note (Signed)
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 Was on Arimidex since 08/22/2012 to 03/30/16  Subcutaneous nodule excisionleft chest: Infiltrating carcinoma breast primary, ER positive, PR negative, HER-2 positive  CT CAP and bone scan06/13/2017: Lytic lesions T8 vertebral, T1 posterior element, subcutaneous nodule left lateral chest wall, nonspecific lung nodules; Bone scan: Mets to kull, left humerus, left eighth rib, T7/T8, sternum, left acetabulum. (In retrospect, original breast MRI in 2002 revealed sternal lesions concerning for metastatic disease, PET CT scan did not reveal metastases)  Goals of treatment: Palliation and prolongation of life. Patient understands that stage IV breast cancer cannot be cured but it can be managed with fairly long survivals.  Treatment Plan: Herceptin Q 4 weeks, Lapatinib, Faslodex, Zometa, started 04/28/2016, today is cycle 7  Toxicities: 1. Diarrhea from lapatinib 2. Injection site discomfort from Faslodex Lab work from today was reviewed.

## 2017-03-29 ENCOUNTER — Ambulatory Visit (HOSPITAL_BASED_OUTPATIENT_CLINIC_OR_DEPARTMENT_OTHER): Payer: Managed Care, Other (non HMO) | Admitting: Hematology and Oncology

## 2017-03-29 ENCOUNTER — Ambulatory Visit (HOSPITAL_BASED_OUTPATIENT_CLINIC_OR_DEPARTMENT_OTHER): Payer: Managed Care, Other (non HMO)

## 2017-03-29 ENCOUNTER — Encounter: Payer: Self-pay | Admitting: Hematology and Oncology

## 2017-03-29 ENCOUNTER — Other Ambulatory Visit (HOSPITAL_BASED_OUTPATIENT_CLINIC_OR_DEPARTMENT_OTHER): Payer: Managed Care, Other (non HMO)

## 2017-03-29 DIAGNOSIS — Z170 Estrogen receptor positive status [ER+]: Secondary | ICD-10-CM

## 2017-03-29 DIAGNOSIS — C50412 Malignant neoplasm of upper-outer quadrant of left female breast: Secondary | ICD-10-CM

## 2017-03-29 DIAGNOSIS — Z5112 Encounter for antineoplastic immunotherapy: Secondary | ICD-10-CM

## 2017-03-29 DIAGNOSIS — C7951 Secondary malignant neoplasm of bone: Secondary | ICD-10-CM

## 2017-03-29 DIAGNOSIS — R0789 Other chest pain: Secondary | ICD-10-CM

## 2017-03-29 DIAGNOSIS — K529 Noninfective gastroenteritis and colitis, unspecified: Secondary | ICD-10-CM | POA: Diagnosis not present

## 2017-03-29 DIAGNOSIS — Z5111 Encounter for antineoplastic chemotherapy: Secondary | ICD-10-CM | POA: Diagnosis not present

## 2017-03-29 LAB — BASIC METABOLIC PANEL
Anion Gap: 10 mEq/L (ref 3–11)
BUN: 10.3 mg/dL (ref 7.0–26.0)
CO2: 28 mEq/L (ref 22–29)
Calcium: 9.8 mg/dL (ref 8.4–10.4)
Chloride: 104 mEq/L (ref 98–109)
Creatinine: 0.8 mg/dL (ref 0.6–1.1)
EGFR: 84 mL/min/{1.73_m2} — ABNORMAL LOW (ref >90)
Glucose: 113 mg/dl (ref 70–140)
Potassium: 3.6 mEq/L (ref 3.5–5.1)
Sodium: 141 mEq/L (ref 136–145)

## 2017-03-29 LAB — CBC WITH DIFFERENTIAL/PLATELET
BASO%: 0.3 % (ref 0.0–2.0)
Basophils Absolute: 0 10*3/uL (ref 0.0–0.1)
EOS%: 2.4 % (ref 0.0–7.0)
Eosinophils Absolute: 0.1 10*3/uL (ref 0.0–0.5)
HCT: 38.9 % (ref 34.8–46.6)
HGB: 12.8 g/dL (ref 11.6–15.9)
LYMPH%: 36.1 % (ref 14.0–49.7)
MCH: 30.3 pg (ref 25.1–34.0)
MCHC: 32.9 g/dL (ref 31.5–36.0)
MCV: 92 fL (ref 79.5–101.0)
MONO#: 0.6 10*3/uL (ref 0.1–0.9)
MONO%: 9.6 % (ref 0.0–14.0)
NEUT#: 3 10*3/uL (ref 1.5–6.5)
NEUT%: 51.6 % (ref 38.4–76.8)
Platelets: 244 10*3/uL (ref 145–400)
RBC: 4.23 10*6/uL (ref 3.70–5.45)
RDW: 13.4 % (ref 11.2–14.5)
WBC: 5.9 10*3/uL (ref 3.9–10.3)
lymph#: 2.1 10*3/uL (ref 0.9–3.3)

## 2017-03-29 MED ORDER — ZOLEDRONIC ACID 4 MG/100ML IV SOLN
4.0000 mg | Freq: Once | INTRAVENOUS | Status: AC
  Administered 2017-03-29: 4 mg via INTRAVENOUS
  Filled 2017-03-29: qty 100

## 2017-03-29 MED ORDER — SODIUM CHLORIDE 0.9 % IV SOLN
Freq: Once | INTRAVENOUS | Status: AC
  Administered 2017-03-29: 11:00:00 via INTRAVENOUS

## 2017-03-29 MED ORDER — HEPARIN SOD (PORK) LOCK FLUSH 100 UNIT/ML IV SOLN
500.0000 [IU] | Freq: Once | INTRAVENOUS | Status: AC | PRN
  Administered 2017-03-29: 500 [IU]
  Filled 2017-03-29: qty 5

## 2017-03-29 MED ORDER — DIPHENHYDRAMINE HCL 25 MG PO CAPS
ORAL_CAPSULE | ORAL | Status: AC
  Filled 2017-03-29: qty 2

## 2017-03-29 MED ORDER — SODIUM CHLORIDE 0.9% FLUSH
10.0000 mL | INTRAVENOUS | Status: DC | PRN
  Administered 2017-03-29: 10 mL
  Filled 2017-03-29: qty 10

## 2017-03-29 MED ORDER — FULVESTRANT 250 MG/5ML IM SOLN
500.0000 mg | INTRAMUSCULAR | Status: DC
  Administered 2017-03-29: 500 mg via INTRAMUSCULAR
  Filled 2017-03-29: qty 10

## 2017-03-29 MED ORDER — ACETAMINOPHEN 325 MG PO TABS
650.0000 mg | ORAL_TABLET | Freq: Once | ORAL | Status: AC
  Administered 2017-03-29: 650 mg via ORAL

## 2017-03-29 MED ORDER — ACETAMINOPHEN 325 MG PO TABS
ORAL_TABLET | ORAL | Status: AC
  Filled 2017-03-29: qty 2

## 2017-03-29 MED ORDER — SODIUM CHLORIDE 0.9 % IV SOLN
6.0000 mg/kg | Freq: Once | INTRAVENOUS | Status: AC
  Administered 2017-03-29: 462 mg via INTRAVENOUS
  Filled 2017-03-29: qty 22

## 2017-03-29 MED ORDER — DIPHENHYDRAMINE HCL 25 MG PO CAPS
50.0000 mg | ORAL_CAPSULE | Freq: Once | ORAL | Status: AC
  Administered 2017-03-29: 50 mg via ORAL

## 2017-03-29 MED ORDER — SODIUM CHLORIDE 0.9 % IV SOLN: Freq: Once | INTRAVENOUS | Status: DC

## 2017-03-29 MED ORDER — LIDOCAINE-PRILOCAINE 2.5-2.5 % EX CREA: TOPICAL_CREAM | CUTANEOUS | 3 refills | Status: DC

## 2017-03-29 NOTE — Patient Instructions (Addendum)
[facility] Discharge Instructions for Patients Receiving Chemotherapy  Today you received the following chemotherapy agents Herceptin, Foslodex, Zometa  To help prevent nausea and vomiting after your treatment, we encourage you to take your nausea medication    If you develop nausea and vomiting that is not controlled by your nausea medication, call the clinic.   BELOW ARE SYMPTOMS THAT SHOULD BE REPORTED IMMEDIATELY:  *FEVER GREATER THAN 100.5 F  *CHILLS WITH OR WITHOUT FEVER  NAUSEA AND VOMITING THAT IS NOT CONTROLLED WITH YOUR NAUSEA MEDICATION  *UNUSUAL SHORTNESS OF BREATH  *UNUSUAL BRUISING OR BLEEDING  TENDERNESS IN MOUTH AND THROAT WITH OR WITHOUT PRESENCE OF ULCERS  *URINARY PROBLEMS  *BOWEL PROBLEMS  UNUSUAL RASH Items with * indicate a potential emergency and should be followed up as soon as possible.  Feel free to call the clinic you have any questions or concerns. The clinic phone number is [phone].  Please show the [place] at check-in to the Emergency Department and triage nurse.

## 2017-03-29 NOTE — Progress Notes (Signed)
Patient Care Team: [name], MD as PCP - General (Family Medicine)  DIAGNOSIS:  Encounter Diagnosis  Name Primary?  . Malignant neoplasm of upper-outer quadrant of left breast in female, estrogen receptor positive ([place])     SUMMARY OF ONCOLOGIC HISTORY:   Breast cancer of upper-outer quadrant of left female breast ([place])   11/14/2011 Surgery    Bilateral mastectomy, prophylactic on the right, left breast IDC 3/18 lymph nodes positive with extracapsular extension ER 89%, PR 81%, HER-2 negative, Ki-67 79% T2 N1 A. stage IIB      12/13/2011 - 06/28/2012 Chemotherapy    4 cycles of FEC followed by 4 cycles of Taxotere      07/17/2012 - 08/22/2012 Radiation Therapy    Adjuvant radiation therapy      08/22/2012 - 03/16/2016 Anti-estrogen oral therapy    Arimidex 1 mg daily      03/16/2016 Relapse/Recurrence    Subcutaneous nodule excision left chest: Infiltrating carcinoma breast primary, ER positive, PR negative      03/29/2016 Imaging    CT CAP and bone scan: Lytic lesions T8 vertebral, T1 posterior element, subcutaneous nodule left lateral chest wall, nonspecific lung nodules; Bone scan: Mets to kull, left humerus, left eighth rib, T7/T8, sternum, left acetabulum      04/28/2016 -  Chemotherapy    Herceptin, lapatinib, Faslodex, Zometa every 4 weeks       CHIEF COMPLIANT: follow-up on Herceptin and lapatinib Faslodex and Zometa  INTERVAL HISTORY: [name] Riley is a 66 year old lady with 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 She continues to have intermittent diarrhea related to lapatinib. Denies any nausea vomiting. Injection site discomfort from Faslodex is also a concern but otherwise she is tolerating the treatment fairly well. Denies any lumps or nodules. Denies any fevers or chills.Patient has a sore spot in the left chest wall that appears to be bleeding constantly over the  past couple of weeks.  REVIEW OF SYSTEMS:   Constitutional: Denies fevers, chills or abnormal weight loss Eyes: Denies blurriness of vision Ears, nose, mouth, throat, and face: Denies mucositis or sore throat Respiratory: Denies cough, dyspnea or wheezes Cardiovascular: Denies palpitation, chest discomfort Gastrointestinal:  Denies nausea, heartburn or change in bowel habits Skin: Denies abnormal skin rashes Lymphatics: Denies new lymphadenopathy or easy bruising Neurological:Denies numbness, tingling or new weaknesses Behavioral/Psych: Mood is stable, no new changes  Extremities: No lower extremity edema Left chest wall: Open area wound that has been bandaged and is bleeding All other systems were reviewed with the patient and are negative.  I have reviewed the past medical history, past surgical history, social history and family history with the patient and they are unchanged from previous note.  ALLERGIES:  is allergic to contrast media [iodinated diagnostic agents] and food.  MEDICATIONS:  Current Outpatient Prescriptions  Medication Sig Dispense Refill  . acetaminophen (TYLENOL) 500 MG tablet Take 1,000 mg by mouth every 6 (six) hours as needed for moderate pain.    Marland Kitchen aspirin 81 MG tablet Take 81 mg by mouth daily.      . Cholecalciferol (VITAMIN D-3 PO) Take 2,000 Int'l Units by mouth daily.    Marland Kitchen doxycycline (VIBRAMYCIN) 100 MG capsule Take 1 capsule (100 mg total) by mouth 2 (two) times daily. 20 capsule 0  . lapatinib (TYKERB) 250 MG tablet TAKE 5 TABLETS (1250MG TOTAL) BY MOUTH DAILY 150 tablet 6  . levothyroxine (SYNTHROID, LEVOTHROID) 75  MCG tablet Take 75 mcg by mouth daily.     Marland Kitchen lidocaine-prilocaine (EMLA) cream Apply to affected area once 30 g 3  . metoprolol succinate (TOPROL-XL) 100 MG 24 hr tablet Take 1 tablet (100 mg total) by mouth daily. 90 tablet 3  . Multiple Vitamins-Minerals (MULTIVITAMIN WITH MINERALS) tablet Take 1 tablet by mouth daily.      . Multiple  Vitamins-Minerals (THERA-M) TABS Take 1 tablet by mouth daily.    . pravastatin (PRAVACHOL) 80 MG tablet Take 80 mg by mouth daily.    . predniSONE (DELTASONE) 50 MG tablet Take 1 tablet 13, 7, and 1 hour prior to CT scan. 5 tablet 0  . Vitamin D, Ergocalciferol, (DRISDOL) 50000 units CAPS capsule Take 50,000 Units by mouth once a week.     No current facility-administered medications for this visit.     PHYSICAL EXAMINATION: ECOG PERFORMANCE STATUS: 1 - Symptomatic but completely ambulatory  Vitals:   03/29/17 0941  BP: (!) 165/92  Pulse: 76  Resp: 18  Temp: 98.2 F (36.8 C)   Filed Weights   03/29/17 0941  Weight: 159 lb 11.2 oz (72.4 kg)    GENERAL:alert, no distress and comfortable SKIN: skin color, texture, turgor are normal, no rashes or significant lesions EYES: normal, Conjunctiva are pink and non-injected, sclera clear OROPHARYNX:no exudate, no erythema and lips, buccal mucosa, and tongue normal  NECK: supple, thyroid normal size, non-tender, without nodularity LYMPH:  no palpable lymphadenopathy in the cervical, axillary or inguinal LUNGS: clear to auscultation and percussion with normal breathing effort HEART: regular rate & rhythm and no murmurs and no lower extremity edema ABDOMEN:abdomen soft, non-tender and normal bowel sounds MUSCULOSKELETAL:no cyanosis of digits and no clubbing  NEURO: alert & oriented x 3 with fluent speech, no focal motor/sensory deficits EXTREMITIES: No lower extremity edema  LABORATORY DATA:  I have reviewed the data as listed   Chemistry      Component Value Date/Time   NA 145 03/06/2017 1147   K 3.5 03/06/2017 1147   CL 106 07/03/2016 0939   CL 105 08/10/2012 0908   CO2 29 03/06/2017 1147   BUN 9.5 03/06/2017 1147   CREATININE 0.7 03/06/2017 1147      Component Value Date/Time   CALCIUM 9.8 03/06/2017 1147   ALKPHOS 115 03/06/2017 1147   AST 28 03/06/2017 1147   ALT 26 03/06/2017 1147   BILITOT 0.47 03/06/2017 1147        Lab Results  Component Value Date   WBC 6.1 03/06/2017   HGB 12.5 03/06/2017   HCT 36.9 03/06/2017   MCV 90.4 03/06/2017   PLT 281 03/06/2017   NEUTROABS 3.1 03/06/2017    ASSESSMENT & PLAN:  Breast cancer of upper-outer quadrant of left female breast ([facility]) Left breast invasive ductal carcinomaT2, N1, 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 Was on Arimidex since 08/22/2012 to 03/30/16  Subcutaneous nodule excisionleft chest: Infiltrating carcinoma breast primary, ER positive, PR negative, HER-2 positive  CT CAP and bone scan06/13/2017: Lytic lesions T8 vertebral, T1 posterior element, subcutaneous nodule left lateral chest wall, nonspecific lung nodules; Bone scan: Mets to kull, left humerus, left eighth rib, T7/T8, sternum, left acetabulum. (In retrospect, original breast MRI in 2002 revealed sternal lesions concerning for metastatic disease, PET CT scan did not reveal metastases) CT CAP and Bone scan  01/24/17: Stable  Goals of treatment: Palliation and prolongation of life. Patient understands that stage IV breast cancer cannot be cured but it can be managed with fairly long survivals.  Treatment Plan: Herceptin Q 4 weeks, Lapatinib, Faslodex, Zometa, started 04/28/2016, today is cycle 13  Toxicities: 1. Diarrhea from lapatinib 2. Injection site discomfort from Faslodex Lab work from today was reviewed.  Bleeding from an open sore in the left chest wall: I requested Dr. [name] to see the patient and discussed management of this area. I am also worried that the tumor subcutaneous nodules appear to be increasing in size. Because of this I would like to obtain scans in couple of months and follow-up afterwards.  I spent 25 minutes talking to the patient of which more than half was spent in counseling and coordination of  care.  Orders Placed This Encounter  Procedures  . CT Abdomen Pelvis W Contrast    Standing Status:   Future    Standing Expiration Date:   03/29/2018    Order Specific Question:   If indicated for the ordered procedure, I authorize the administration of contrast media per Radiology protocol    Answer:   Yes    Order Specific Question:   Reason for Exam (SYMPTOM  OR DIAGNOSIS REQUIRED)    Answer:   Metastatic breast cancer restaging    Order Specific Question:   Preferred imaging location?    Answer:   [facility]    Order Specific Question:   Radiology Contrast Protocol - do NOT remove file path    Answer:   \\[email or web address]  . CT Chest W Contrast    Standing Status:   Future    Standing Expiration Date:   03/29/2018    Order Specific Question:   If indicated for the ordered procedure, I authorize the administration of contrast media per Radiology protocol    Answer:   Yes    Order Specific Question:   Reason for Exam (SYMPTOM  OR DIAGNOSIS REQUIRED)    Answer:   Metastatic breast cancer restaging    Order Specific Question:   Preferred imaging location?    Answer:   [facility]    Order Specific Question:   Radiology Contrast Protocol - do NOT remove file path    Answer:   \\[email or web address]  . NM Bone Scan Whole Body    Standing Status:   Future    Standing Expiration Date:   03/29/2018    Order Specific Question:   Reason for Exam (SYMPTOM  OR DIAGNOSIS REQUIRED)    Answer:   Metastatic breast cancer restaging    Order Specific Question:   If indicated for the ordered procedure, I authorize the administration of a radiopharmaceutical per Radiology protocol    Answer:   Yes    Order Specific Question:   Preferred imaging location?    Answer:   [facility]    Order Specific Question:   Radiology Contrast Protocol - do NOT remove file path    Answer:   \\[email or web address]    The patient has a good understanding of the overall plan. she agrees with it. she will call with any problems that may develop before the next visit here.   Rulon Eisenmenger, MD 03/29/17

## 2017-04-26 ENCOUNTER — Ambulatory Visit: Payer: Managed Care, Other (non HMO)

## 2017-04-26 ENCOUNTER — Ambulatory Visit (HOSPITAL_BASED_OUTPATIENT_CLINIC_OR_DEPARTMENT_OTHER): Payer: Managed Care, Other (non HMO)

## 2017-04-26 VITALS — BP 170/85 | HR 73 | Temp 98.2°F | Resp 17

## 2017-04-26 DIAGNOSIS — Z5111 Encounter for antineoplastic chemotherapy: Secondary | ICD-10-CM

## 2017-04-26 DIAGNOSIS — Z5112 Encounter for antineoplastic immunotherapy: Secondary | ICD-10-CM

## 2017-04-26 DIAGNOSIS — C50412 Malignant neoplasm of upper-outer quadrant of left female breast: Secondary | ICD-10-CM

## 2017-04-26 DIAGNOSIS — C7951 Secondary malignant neoplasm of bone: Secondary | ICD-10-CM

## 2017-04-26 DIAGNOSIS — Z170 Estrogen receptor positive status [ER+]: Secondary | ICD-10-CM

## 2017-04-26 LAB — COMPREHENSIVE METABOLIC PANEL
ALT: 22 U/L (ref 0–55)
AST: 29 U/L (ref 5–34)
Albumin: 3.9 g/dL (ref 3.5–5.0)
Alkaline Phosphatase: 100 U/L (ref 40–150)
Anion Gap: 8 mEq/L (ref 3–11)
BUN: 10.5 mg/dL (ref 7.0–26.0)
CO2: 30 mEq/L — ABNORMAL HIGH (ref 22–29)
Calcium: 9.5 mg/dL (ref 8.4–10.4)
Chloride: 103 mEq/L (ref 98–109)
Creatinine: 0.7 mg/dL (ref 0.6–1.1)
EGFR: >90 mL/min/{1.73_m2} (ref >90)
Glucose: 110 mg/dl (ref 70–140)
Potassium: 3.5 mEq/L (ref 3.5–5.1)
Sodium: 141 mEq/L (ref 136–145)
Total Bilirubin: 0.6 mg/dL (ref 0.20–1.20)
Total Protein: 7.2 g/dL (ref 6.4–8.3)

## 2017-04-26 MED ORDER — TRASTUZUMAB CHEMO 150 MG IV SOLR
450.0000 mg | Freq: Once | INTRAVENOUS | Status: AC
  Administered 2017-04-26: 450 mg via INTRAVENOUS
  Filled 2017-04-26: qty 21.43

## 2017-04-26 MED ORDER — ZOLEDRONIC ACID 4 MG/100ML IV SOLN
4.0000 mg | Freq: Once | INTRAVENOUS | Status: AC
  Administered 2017-04-26: 4 mg via INTRAVENOUS
  Filled 2017-04-26: qty 100

## 2017-04-26 MED ORDER — DIPHENHYDRAMINE HCL 25 MG PO CAPS
50.0000 mg | ORAL_CAPSULE | Freq: Once | ORAL | Status: AC
  Administered 2017-04-26: 50 mg via ORAL

## 2017-04-26 MED ORDER — SODIUM CHLORIDE 0.9 % IV SOLN
Freq: Once | INTRAVENOUS | Status: AC
  Administered 2017-04-26: 10:00:00 via INTRAVENOUS

## 2017-04-26 MED ORDER — DIPHENHYDRAMINE HCL 25 MG PO CAPS
ORAL_CAPSULE | ORAL | Status: AC
  Filled 2017-04-26: qty 2

## 2017-04-26 MED ORDER — ACETAMINOPHEN 325 MG PO TABS
650.0000 mg | ORAL_TABLET | Freq: Once | ORAL | Status: AC
  Administered 2017-04-26: 650 mg via ORAL

## 2017-04-26 MED ORDER — FULVESTRANT 250 MG/5ML IM SOLN
500.0000 mg | INTRAMUSCULAR | Status: DC
  Administered 2017-04-26: 500 mg via INTRAMUSCULAR
  Filled 2017-04-26: qty 10

## 2017-04-26 MED ORDER — SODIUM CHLORIDE 0.9% FLUSH
10.0000 mL | INTRAVENOUS | Status: DC | PRN
  Administered 2017-04-26: 10 mL
  Filled 2017-04-26: qty 10

## 2017-04-26 MED ORDER — ACETAMINOPHEN 325 MG PO TABS
ORAL_TABLET | ORAL | Status: AC
  Filled 2017-04-26: qty 2

## 2017-04-26 MED ORDER — HEPARIN SOD (PORK) LOCK FLUSH 100 UNIT/ML IV SOLN
500.0000 [IU] | Freq: Once | INTRAVENOUS | Status: AC | PRN
  Administered 2017-04-26: 500 [IU]
  Filled 2017-04-26: qty 5

## 2017-04-26 NOTE — Patient Instructions (Signed)
[place] Discharge Instructions for Patients Receiving Chemotherapy  Today you received the following chemotherapy agents Herceptin, Zometa, Faslodex.   To help prevent nausea and vomiting after your treatment, we encourage you to take your nausea medication as prescribed.   If you develop nausea and vomiting that is not controlled by your nausea medication, call the clinic.   BELOW ARE SYMPTOMS THAT SHOULD BE REPORTED IMMEDIATELY:  *FEVER GREATER THAN 100.5 F  *CHILLS WITH OR WITHOUT FEVER  NAUSEA AND VOMITING THAT IS NOT CONTROLLED WITH YOUR NAUSEA MEDICATION  *UNUSUAL SHORTNESS OF BREATH  *UNUSUAL BRUISING OR BLEEDING  TENDERNESS IN MOUTH AND THROAT WITH OR WITHOUT PRESENCE OF ULCERS  *URINARY PROBLEMS  *BOWEL PROBLEMS  UNUSUAL RASH Items with * indicate a potential emergency and should be followed up as soon as possible.  Feel free to call the clinic you have any questions or concerns. The clinic phone number is [phone].  Please show the [place] at check-in to the Emergency Department and triage nurse.

## 2017-05-23 ENCOUNTER — Other Ambulatory Visit: Payer: Self-pay | Admitting: Hematology and Oncology

## 2017-05-23 NOTE — Assessment & Plan Note (Signed)
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 Was on Arimidex since 08/22/2012 to 03/30/16  Subcutaneous nodule excisionleft chest: Infiltrating carcinoma breast primary, ER positive, PR negative, HER-2 positive  CT CAP and bone scan06/13/2017: Lytic lesions T8 vertebral, T1 posterior element, subcutaneous nodule left lateral chest wall, nonspecific lung nodules; Bone scan: Mets to kull, left humerus, left eighth rib, T7/T8, sternum, left acetabulum. (In retrospect, original breast MRI in 2002 revealed sternal lesions concerning for metastatic disease, PET CT scan did not reveal metastases)  Goals of treatment: Palliation and prolongation of life. Patient understands that stage IV breast cancer cannot be cured but it can be managed with fairly long survivals.  Treatment Plan: Herceptin Q 4 weeks, Lapatinib, Faslodex, Zometa, started 04/28/2016, today is cycle 8  Toxicities: 1. Diarrhea from lapatinib 2. Injection site discomfort from Faslodex Lab work from today was reviewed.  RTC in 8 weeks

## 2017-05-24 ENCOUNTER — Other Ambulatory Visit: Payer: Self-pay

## 2017-05-24 ENCOUNTER — Ambulatory Visit (HOSPITAL_BASED_OUTPATIENT_CLINIC_OR_DEPARTMENT_OTHER): Payer: Managed Care, Other (non HMO)

## 2017-05-24 ENCOUNTER — Other Ambulatory Visit: Payer: Self-pay | Admitting: Hematology and Oncology

## 2017-05-24 ENCOUNTER — Telehealth: Payer: Self-pay | Admitting: Hematology and Oncology

## 2017-05-24 ENCOUNTER — Other Ambulatory Visit (HOSPITAL_BASED_OUTPATIENT_CLINIC_OR_DEPARTMENT_OTHER): Payer: Managed Care, Other (non HMO)

## 2017-05-24 ENCOUNTER — Encounter: Payer: Self-pay | Admitting: Hematology and Oncology

## 2017-05-24 ENCOUNTER — Ambulatory Visit (HOSPITAL_BASED_OUTPATIENT_CLINIC_OR_DEPARTMENT_OTHER): Payer: Managed Care, Other (non HMO) | Admitting: Hematology and Oncology

## 2017-05-24 DIAGNOSIS — C50412 Malignant neoplasm of upper-outer quadrant of left female breast: Secondary | ICD-10-CM

## 2017-05-24 DIAGNOSIS — Z170 Estrogen receptor positive status [ER+]: Secondary | ICD-10-CM

## 2017-05-24 DIAGNOSIS — Z5112 Encounter for antineoplastic immunotherapy: Secondary | ICD-10-CM

## 2017-05-24 DIAGNOSIS — R911 Solitary pulmonary nodule: Secondary | ICD-10-CM | POA: Diagnosis not present

## 2017-05-24 DIAGNOSIS — Z5111 Encounter for antineoplastic chemotherapy: Secondary | ICD-10-CM | POA: Diagnosis not present

## 2017-05-24 DIAGNOSIS — R229 Localized swelling, mass and lump, unspecified: Secondary | ICD-10-CM | POA: Diagnosis not present

## 2017-05-24 DIAGNOSIS — C7951 Secondary malignant neoplasm of bone: Secondary | ICD-10-CM

## 2017-05-24 DIAGNOSIS — C773 Secondary and unspecified malignant neoplasm of axilla and upper limb lymph nodes: Secondary | ICD-10-CM

## 2017-05-24 DIAGNOSIS — Z5181 Encounter for therapeutic drug level monitoring: Secondary | ICD-10-CM

## 2017-05-24 LAB — CBC WITH DIFFERENTIAL/PLATELET
BASO%: 0.6 % (ref 0.0–2.0)
Basophils Absolute: 0 10*3/uL (ref 0.0–0.1)
EOS%: 1.9 % (ref 0.0–7.0)
Eosinophils Absolute: 0.1 10*3/uL (ref 0.0–0.5)
HCT: 38.5 % (ref 34.8–46.6)
HGB: 13 g/dL (ref 11.6–15.9)
LYMPH%: 32.6 % (ref 14.0–49.7)
MCH: 30.5 pg (ref 25.1–34.0)
MCHC: 33.9 g/dL (ref 31.5–36.0)
MCV: 90 fL (ref 79.5–101.0)
MONO#: 0.5 10*3/uL (ref 0.1–0.9)
MONO%: 9 % (ref 0.0–14.0)
NEUT#: 3.4 10*3/uL (ref 1.5–6.5)
NEUT%: 55.9 % (ref 38.4–76.8)
Platelets: 265 10*3/uL (ref 145–400)
RBC: 4.27 10*6/uL (ref 3.70–5.45)
RDW: 13.5 % (ref 11.2–14.5)
WBC: 6 10*3/uL (ref 3.9–10.3)
lymph#: 2 10*3/uL (ref 0.9–3.3)

## 2017-05-24 LAB — COMPREHENSIVE METABOLIC PANEL
ALT: 27 U/L (ref 0–55)
AST: 33 U/L (ref 5–34)
Albumin: 4.2 g/dL (ref 3.5–5.0)
Alkaline Phosphatase: 128 U/L (ref 40–150)
Anion Gap: 11 mEq/L (ref 3–11)
BUN: 9 mg/dL (ref 7.0–26.0)
CO2: 26 mEq/L (ref 22–29)
Calcium: 9.8 mg/dL (ref 8.4–10.4)
Chloride: 104 mEq/L (ref 98–109)
Creatinine: 0.7 mg/dL (ref 0.6–1.1)
EGFR: 88 mL/min/{1.73_m2} — ABNORMAL LOW (ref >90)
Glucose: 89 mg/dl (ref 70–140)
Potassium: 3.5 mEq/L (ref 3.5–5.1)
Sodium: 141 mEq/L (ref 136–145)
Total Bilirubin: 0.58 mg/dL (ref 0.20–1.20)
Total Protein: 7.7 g/dL (ref 6.4–8.3)

## 2017-05-24 MED ORDER — TRASTUZUMAB CHEMO INJECTION 440 MG
450.0000 mg | Freq: Once | INTRAVENOUS | Status: AC
  Administered 2017-05-24: 450 mg via INTRAVENOUS
  Filled 2017-05-24: qty 21.43

## 2017-05-24 MED ORDER — ZOLEDRONIC ACID 4 MG/100ML IV SOLN
4.0000 mg | Freq: Once | INTRAVENOUS | Status: AC
  Administered 2017-05-24: 4 mg via INTRAVENOUS
  Filled 2017-05-24: qty 100

## 2017-05-24 MED ORDER — DIPHENHYDRAMINE HCL 25 MG PO CAPS
50.0000 mg | ORAL_CAPSULE | Freq: Once | ORAL | Status: AC
  Administered 2017-05-24: 50 mg via ORAL

## 2017-05-24 MED ORDER — ACETAMINOPHEN 325 MG PO TABS
650.0000 mg | ORAL_TABLET | Freq: Once | ORAL | Status: AC
  Administered 2017-05-24: 650 mg via ORAL

## 2017-05-24 MED ORDER — SODIUM CHLORIDE 0.9 % IV SOLN
Freq: Once | INTRAVENOUS | Status: AC
  Administered 2017-05-24: 10:00:00 via INTRAVENOUS

## 2017-05-24 MED ORDER — HEPARIN SOD (PORK) LOCK FLUSH 100 UNIT/ML IV SOLN
500.0000 [IU] | Freq: Once | INTRAVENOUS | Status: AC | PRN
  Administered 2017-05-24: 500 [IU]
  Filled 2017-05-24: qty 5

## 2017-05-24 MED ORDER — ACETAMINOPHEN 325 MG PO TABS
ORAL_TABLET | ORAL | Status: AC
  Filled 2017-05-24: qty 2

## 2017-05-24 MED ORDER — SODIUM CHLORIDE 0.9% FLUSH
10.0000 mL | INTRAVENOUS | Status: DC | PRN
  Administered 2017-05-24: 10 mL
  Filled 2017-05-24: qty 10

## 2017-05-24 MED ORDER — FULVESTRANT 250 MG/5ML IM SOLN
500.0000 mg | Freq: Once | INTRAMUSCULAR | Status: AC
  Administered 2017-05-24: 500 mg via INTRAMUSCULAR
  Filled 2017-05-24: qty 10

## 2017-05-24 MED ORDER — DIPHENHYDRAMINE HCL 25 MG PO CAPS
ORAL_CAPSULE | ORAL | Status: AC
  Filled 2017-05-24: qty 2

## 2017-05-24 NOTE — Progress Notes (Signed)
Per May, RN per Dr. Lindi Adie okay to treat today with ECHO from 01/31/17.

## 2017-05-24 NOTE — Progress Notes (Signed)
Patient Care Team: [name], MD as PCP - General (Family Medicine)  DIAGNOSIS:  Encounter Diagnosis  Name Primary?  . Malignant neoplasm of upper-outer quadrant of left breast in female, estrogen receptor positive ([name])     SUMMARY OF ONCOLOGIC HISTORY:   Breast cancer of upper-outer quadrant of left female breast ([place])   11/14/2011 Surgery    Bilateral mastectomy, prophylactic on the right, left breast IDC 3/18 lymph nodes positive with extracapsular extension ER 89%, PR 81%, HER-2 negative, Ki-67 79% T2 N1 A. stage IIB      12/13/2011 - 06/28/2012 Chemotherapy    4 cycles of FEC followed by 4 cycles of Taxotere      07/17/2012 - 08/22/2012 Radiation Therapy    Adjuvant radiation therapy      08/22/2012 - 03/16/2016 Anti-estrogen oral therapy    Arimidex 1 mg daily      03/16/2016 Relapse/Recurrence    Subcutaneous nodule excision left chest: Infiltrating carcinoma breast primary, ER positive, PR negative      03/29/2016 Imaging    CT CAP and bone scan: Lytic lesions T8 vertebral, T1 posterior element, subcutaneous nodule left lateral chest wall, nonspecific lung nodules; Bone scan: Mets to kull, left humerus, left eighth rib, T7/T8, sternum, left acetabulum      04/28/2016 -  Chemotherapy    Herceptin, lapatinib, Faslodex, Zometa every 4 weeks       CHIEF COMPLIANT: Cycle 8 Herceptin, lapatinib, Faslodex  INTERVAL HISTORY: [name] Riley is a 66 year old lady with metastatic breast cancer who is currently on palliative treatment with Herceptin, lapatinib and Faslodex. She appears to be tolerating it extremely well. She denies any new pain or discomfort. Her last scans were done in April 2018. She continues to have slight oozing from the chest wall lesion and had seen Dr. Molli Posey. She would like to see wound care specialist again. Other subcutaneous nodules appear to be shrinking in size.  REVIEW OF SYSTEMS:   Constitutional: Denies fevers, chills or abnormal weight  loss Eyes: Denies blurriness of vision Ears, nose, mouth, throat, and face: Denies mucositis or sore throat Respiratory: Denies cough, dyspnea or wheezes Cardiovascular: Denies palpitation, chest discomfort Gastrointestinal:  Denies nausea, heartburn or change in bowel habits Skin: Denies abnormal skin rashes Lymphatics: Denies new lymphadenopathy or easy bruising Neurological:Denies numbness, tingling or new weaknesses Behavioral/Psych: Mood is stable, no new changes  Extremities: No lower extremity edema  All other systems were reviewed with the patient and are negative.  I have reviewed the past medical history, past surgical history, social history and family history with the patient and they are unchanged from previous note.  ALLERGIES:  is allergic to contrast media [iodinated diagnostic agents] and food.  MEDICATIONS:  Current Outpatient Prescriptions  Medication Sig Dispense Refill  . acetaminophen (TYLENOL) 500 MG tablet Take 1,000 mg by mouth every 6 (six) hours as needed for moderate pain.    Marland Kitchen aspirin 81 MG tablet Take 81 mg by mouth daily.      . Cholecalciferol (VITAMIN D-3 PO) Take 2,000 Int'l Units by mouth daily.    Marland Kitchen doxycycline (VIBRAMYCIN) 100 MG capsule Take 1 capsule (100 mg total) by mouth 2 (two) times daily. 20 capsule 0  . lapatinib (TYKERB) 250 MG tablet TAKE 5 TABLETS (1250MG TOTAL) BY MOUTH DAILY 150 tablet 6  . levothyroxine (SYNTHROID, LEVOTHROID) 75 MCG tablet Take 75 mcg by mouth daily.     Marland Kitchen lidocaine-prilocaine (EMLA) cream Apply to affected area once 30 g 3  .  metoprolol succinate (TOPROL-XL) 100 MG 24 hr tablet Take 1 tablet (100 mg total) by mouth daily. 90 tablet 3  . Multiple Vitamins-Minerals (MULTIVITAMIN WITH MINERALS) tablet Take 1 tablet by mouth daily.      . Multiple Vitamins-Minerals (THERA-M) TABS Take 1 tablet by mouth daily.    . pravastatin (PRAVACHOL) 80 MG tablet Take 80 mg by mouth daily.    . predniSONE (DELTASONE) 50 MG tablet  Take 1 tablet 13, 7, and 1 hour prior to CT scan. 5 tablet 0  . Vitamin D, Ergocalciferol, (DRISDOL) 50000 units CAPS capsule Take 50,000 Units by mouth once a week.     No current facility-administered medications for this visit.     PHYSICAL EXAMINATION: ECOG PERFORMANCE STATUS: 1 - Symptomatic but completely ambulatory  Vitals:   05/24/17 0829  BP: (!) 188/92  Pulse: 91  Resp: 18  Temp: 98.4 F (36.9 C)   Filed Weights   05/24/17 0829  Weight: 156 lb 4.8 oz (70.9 kg)    GENERAL:alert, no distress and comfortable SKIN: skin color, texture, turgor are normal, no rashes or significant lesions EYES: normal, Conjunctiva are pink and non-injected, sclera clear OROPHARYNX:no exudate, no erythema and lips, buccal mucosa, and tongue normal  NECK: supple, thyroid normal size, non-tender, without nodularity LYMPH:  no palpable lymphadenopathy in the cervical, axillary or inguinal LUNGS: clear to auscultation and percussion with normal breathing effort HEART: regular rate & rhythm and no murmurs and no lower extremity edema ABDOMEN:abdomen soft, non-tender and normal bowel sounds MUSCULOSKELETAL:no cyanosis of digits and no clubbing  NEURO: alert & oriented x 3 with fluent speech, no focal motor/sensory deficits EXTREMITIES: No lower extremity edema  LABORATORY DATA:  I have reviewed the data as listed   Chemistry      Component Value Date/Time   NA 141 04/26/2017 0948   K 3.5 04/26/2017 0948   CL 106 07/03/2016 0939   CL 105 08/10/2012 0908   CO2 30 (H) 04/26/2017 0948   BUN 10.5 04/26/2017 0948   CREATININE 0.7 04/26/2017 0948      Component Value Date/Time   CALCIUM 9.5 04/26/2017 0948   ALKPHOS 100 04/26/2017 0948   AST 29 04/26/2017 0948   ALT 22 04/26/2017 0948   BILITOT 0.60 04/26/2017 0948       Lab Results  Component Value Date   WBC 5.9 03/29/2017   HGB 12.8 03/29/2017   HCT 38.9 03/29/2017   MCV 92.0 03/29/2017   PLT 244 03/29/2017   NEUTROABS 3.0  03/29/2017    ASSESSMENT & PLAN:  Breast cancer of upper-outer quadrant of left female breast ([place]) Left breast invasive ductal carcinomaT2, N1, 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 Was on Arimidex since 08/22/2012 to 03/30/16  Subcutaneous nodule excisionleft chest: Infiltrating carcinoma breast primary, ER positive, PR negative, HER-2 positive  CT CAP and bone scan06/13/2017: Lytic lesions T8 vertebral, T1 posterior element, subcutaneous nodule left lateral chest wall, nonspecific lung nodules; Bone scan: Mets to kull, left humerus, left eighth rib, T7/T8, sternum, left acetabulum. (In retrospect, original breast MRI in 2002 revealed sternal lesions concerning for metastatic disease, PET CT scan did not reveal metastases)  Goals of treatment: Palliation and prolongation of life. Patient understands that stage IV breast cancer cannot be cured but it can be managed with fairly long survivals.  Treatment  Plan: Herceptin Q 4 weeks, Lapatinib, Faslodex, Zometa, started 04/28/2016, today is cycle 8  Toxicities: 1. Diarrhea from lapatinib 2. Injection site discomfort from Faslodex Lab work from today is pending  RTC in 8 weeks after undergoing scans  I spent 25 minutes talking to the patient of which more than half was spent in counseling and coordination of care.  Orders Placed This Encounter  Procedures  . CT Chest W Contrast    Standing Status:   Future    Standing Expiration Date:   05/24/2018    Order Specific Question:   If indicated for the ordered procedure, I authorize the administration of contrast media per Radiology protocol    Answer:   Yes    Order Specific Question:   Preferred imaging location?    Answer:   [facility]    Order Specific Question:   Radiology Contrast Protocol - do NOT remove file path     Answer:   [email or web address]    Order Specific Question:   Reason for Exam additional comments    Answer:   Metastatic breast cancer restaging  . CT Abdomen Pelvis W Contrast    Standing Status:   Future    Standing Expiration Date:   05/24/2018    Order Specific Question:   If indicated for the ordered procedure, I authorize the administration of contrast media per Radiology protocol    Answer:   Yes    Order Specific Question:   Preferred imaging location?    Answer:   [facility]    Order Specific Question:   Radiology Contrast Protocol - do NOT remove file path    Answer:   [email or web address]    Order Specific Question:   Reason for Exam additional comments    Answer:   Metastatic breast cancer restaging  . NM Bone Scan Whole Body    Standing Status:   Future    Standing Expiration Date:   05/24/2018    Order Specific Question:   If indicated for the ordered procedure, I authorize the administration of a radiopharmaceutical per Radiology protocol    Answer:   Yes    Order Specific Question:   Preferred imaging location?    Answer:   [facility]    Order Specific Question:   Radiology Contrast Protocol - do NOT remove file path    Answer:   [email or web address]    Order Specific Question:   Reason for Exam additional comments    Answer:   Metastatic breast cancer restaging   The patient has a good understanding of the overall plan. she agrees with it. she will call with any problems that may develop before the next visit here.   Rulon Eisenmenger, MD 05/24/17

## 2017-05-24 NOTE — Telephone Encounter (Signed)
Pre-cert Verification for the following procedure   Echo scheduled for 06-08-17

## 2017-05-24 NOTE — Patient Instructions (Addendum)
[facility] Discharge Instructions for Patients Receiving Chemotherapy  Today you received the following chemotherapy agents: Herceptin   To help prevent nausea and vomiting after your treatment, we encourage you to take your nausea medication as directed.    If you develop nausea and vomiting that is not controlled by your nausea medication, call the clinic.   BELOW ARE SYMPTOMS THAT SHOULD BE REPORTED IMMEDIATELY:  *FEVER GREATER THAN 100.5 F  *CHILLS WITH OR WITHOUT FEVER  NAUSEA AND VOMITING THAT IS NOT CONTROLLED WITH YOUR NAUSEA MEDICATION  *UNUSUAL SHORTNESS OF BREATH  *UNUSUAL BRUISING OR BLEEDING  TENDERNESS IN MOUTH AND THROAT WITH OR WITHOUT PRESENCE OF ULCERS  *URINARY PROBLEMS  *BOWEL PROBLEMS  UNUSUAL RASH Items with * indicate a potential emergency and should be followed up as soon as possible.  Feel free to call the clinic you have any questions or concerns. The clinic phone number is [phone].  Please show the CHEMO ALERT CARD at check-in to the Emergency Department and triage nurse.   

## 2017-05-29 ENCOUNTER — Telehealth: Payer: Self-pay | Admitting: *Deleted

## 2017-05-29 MED ORDER — LAPATINIB DITOSYLATE 250 MG PO TABS: ORAL_TABLET | ORAL | 6 refills | Status: DC

## 2017-05-29 NOTE — Telephone Encounter (Signed)
"  Calling to confirm you received Friday's voicemail to call [facility] [phone] to obtain Tykerb prior authorization.  Call me [phone]."  Call routed. Called patient to confirm receipt of today's message.  "I have a three pills left.  Denies new insurance stating "Been on this for a long time.  They said to call [phone] for faster prior authorization."  This nurse will send refill at this time.  Unable to clarify if refill authorization or prior authorization is what today's need is.

## 2017-05-30 ENCOUNTER — Telehealth: Payer: Self-pay | Admitting: Pharmacy Technician

## 2017-05-30 NOTE — Telephone Encounter (Signed)
Oral Oncology Patient Advocate Encounter  Re-approval of the Prior Authorization for Tykerb has been approved.    Effective dates: 05/30/2017 through 11/30/2017.  Fabio Asa. Melynda Keller, [place] Patient [place] [phone] 05/30/2017 8:42 AM

## 2017-06-01 ENCOUNTER — Ambulatory Visit: Payer: Managed Care, Other (non HMO) | Admitting: Hematology and Oncology

## 2017-06-05 ENCOUNTER — Ambulatory Visit: Payer: Self-pay | Admitting: Surgery

## 2017-06-05 NOTE — H&P (Signed)
History of Present Illness [name] Riley. [name] Pantoja MD; 06/05/2017 9:12 PM) The patient is a 66 year old female who presents with breast cancer. The patient is a 66 year old female who presents with breast cancer. 66 yo female s/p left modified radical mastectomy and right simple mastectomy 1/13. 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  PAST THERAPY: Bilateral mastectomy (Prophylactic on Right) on 11/14/2011  4 cycles FEC  4 cycles Taxotere  Radiation completed 08/22/2012  CURRENT THERAPY: Arimidex, started 08/22/2012  In 2017, she has developed 4 small cutaneous lesions below her mastectomy incision on the left chest wall. The most lateral lesion near the axilla has formed a small eschar. She was examined by Dr. Lindi Adie who expressed concern that this could be a cutaneous metastases. She was referred for excisional biopsy which confirmed recurrent breast carcinoma. She began chemotherapy again in July 2017. Her port placement was complicated by a small pneumothorax.   Most of the subcutaneous nodules on her left chest wall have resolved, but the most lateral lesion has been bleeding intermittently over the last couple of months. Occasionally the bleeding is fairly severe. She also has one other palpable nodule more medially. No tenderness.   Problem List/Past Medical [name] Key K. Aishi Courts, MD; 06/05/2017 9:16 PM) ABSCESS OF ABDOMINAL WALL (L02.211) RECURRENT BREAST CANCER, LEFT (Q67.619)  Past Surgical History (Ballard Budney K. Jarrett Chicoine, MD; 06/05/2017 9:16 PM) Appendectomy Breast Biopsy Bilateral. Colon Polyp Removal - Colonoscopy Hysterectomy (not Riley to cancer) - Complete Mastectomy Bilateral. Tonsillectomy  Diagnostic Studies History ([name] Petrilla K. Latica Hohmann, MD; 06/05/2017 9:16  PM) Colonoscopy 5-10 years ago Mammogram >3 years ago Pap Smear 1-5 years ago  Allergies ([name] Riley, RMA; 06/05/2017 11:29 AM) [place] Shortness of breath. No Known Drug Allergies 04/03/2017 Allergies Reconciled  Medication History ([name] Riley, [name] Riley; 06/05/2017 11:29 AM) Vitamin D (Ergocalciferol) (50000UNIT Capsule, Oral) Active. Acetaminophen (500MG Tablet, Oral) Active. Aspirin (81MG Tablet, Oral) Active. Lapatinib Ditosylate (250MG Tablet, Oral) Active. Lidocaine-Prilocaine (2.5-2.5% Cream, External) Active. Multivitamins/Minerals (Oral) Active. (Thera-M) Pravachol (80MG Tablet, Oral) Active. Vitamin D (50000U Tablet, Oral) Active. Levothyroxine Sodium (75MCG Tablet, Oral) Active. Metoprolol Succinate ER (100MG Tablet ER 24HR, Oral) Active. Vitamin D (Cholecalciferol) (1000UNIT Tablet, Oral) Active. Medications Reconciled  Social History [name] Riley. [name] Servello, MD; 06/05/2017 9:16 PM) Caffeine use Tea. No alcohol use No drug use Tobacco use Never smoker.  Family History [name] Riley. [name] Weikel, MD; 06/05/2017 9:16 PM) Heart Disease Family Members In General. Hypertension Brother, Family Members In General, Mother. Prostate Cancer Family Members In General.  Pregnancy / Birth History [name] Riley. [name] Stotler, MD; 06/05/2017 9:16 PM) Age at menarche 50 years. Age of menopause 41-50 Contraceptive History Contraceptive implant. Gravida 4 Maternal age 65-25 Para 3  Other Problems [name] Riley. [name] Scalisi, MD; 06/05/2017 9:16 PM) Anxiety Disorder Breast Cancer High blood pressure Hypercholesterolemia Kidney Stone Myocardial infarction    Vitals ([name] Riley RMA; 06/05/2017 11:28 AM) 06/05/2017 11:28 AM Weight: 157.4 lb Height: 68in Body Surface Area: 1.85 m Body Mass Index: 23.93 kg/m  Temp.: 97.23F  Pulse: 84 (Regular)  BP: 148/84 (Sitting, Left Arm, Standard)      Physical Exam [name] Key K. Naleyah Ohlinger MD; 06/05/2017 9:14  PM)  The physical exam findings are as follows: Note:GENERAL:alert, no distress and comfortable SKIN: skin color, texture, turgor are normal, no rashes or significant lesions EYES: normal, Conjunctiva are pink and non-injected, sclera clear OROPHARYNX:no exudate, no erythema and lips,  buccal mucosa, and tongue normal NECK: supple, thyroid normal size, non-tender, without nodularity LYMPH: no palpable lymphadenopathy in the cervical, axillary or inguinal LUNGS: clear to auscultation and percussion with normal breathing effort CHEST: healed mastectomy scars; left side laterally with 2 cm enlarging granulated mass with some more protrusion; no active bleeding; medially, there is a 1 cm palpable subcutaneous nodule that appears to be fixed HEART: regular rate & rhythm and no murmurs and no lower extremity edema ABDOMEN:abdomen soft, non-tender and normal bowel sounds MUSCULOSKELETAL:no cyanosis of digits and no clubbing NEURO: alert & oriented x 3 with fluent speech, no focal motor/sensory deficits EXTREMITIES: No lower extremity edema    Assessment & Plan [name] Key K. [name] Due MD; 06/05/2017 9:15 PM)  RECURRENT BREAST CANCER, LEFT (C50.912)  Current Plans Schedule for Surgery - Excision of chest wall nodules - left chest. The surgical procedure has been discussed with the patient. Potential risks, benefits, alternative treatments, and expected outcomes have been explained. All of the patient's questions at this time have been answered. The likelihood of reaching the patient's treatment goal is good. The patient understand the proposed surgical procedure and wishes to proceed. Note:Since the large chest nodule continues to enlarge and bleed, we will excise this under anesthesia. We will also excise the more medial nodule. She understands that this isn't curative, but we are trying to excise these so that the wounds will heal and no longer ooze or bleed.  [name] Riley. [name] Dover, MD, [facility]  06/05/2017 9:16 PM

## 2017-06-08 ENCOUNTER — Other Ambulatory Visit: Payer: Managed Care, Other (non HMO)

## 2017-06-12 ENCOUNTER — Telehealth: Payer: Self-pay | Admitting: Hematology and Oncology

## 2017-06-12 ENCOUNTER — Telehealth: Payer: Self-pay

## 2017-06-12 NOTE — Telephone Encounter (Signed)
Pt called with a rash. Started about 2 weeks ago. Looked like little tiny mosquito bite here and there, on her stomach. Now some of them have a pus head on them and are beet red. Will pop and scab over. Itchy. No pain. No fever.  Been using bacitracin cream, neosporin. Below waistband on panties.  Supposed to have outpatient surgery on her breast surgery incision soon.  On tykerb daily.   Vacation to [place] on Wednesday.   Placed inbasket schedule request for early AM tomorrow.

## 2017-06-12 NOTE — Telephone Encounter (Signed)
Spoke with patient re f/u 8/28.

## 2017-06-13 ENCOUNTER — Ambulatory Visit (HOSPITAL_BASED_OUTPATIENT_CLINIC_OR_DEPARTMENT_OTHER): Payer: Managed Care, Other (non HMO) | Admitting: Hematology and Oncology

## 2017-06-13 ENCOUNTER — Encounter: Payer: Self-pay | Admitting: Hematology and Oncology

## 2017-06-13 DIAGNOSIS — R911 Solitary pulmonary nodule: Secondary | ICD-10-CM

## 2017-06-13 DIAGNOSIS — Z170 Estrogen receptor positive status [ER+]: Secondary | ICD-10-CM

## 2017-06-13 DIAGNOSIS — C773 Secondary and unspecified malignant neoplasm of axilla and upper limb lymph nodes: Secondary | ICD-10-CM | POA: Diagnosis not present

## 2017-06-13 DIAGNOSIS — L989 Disorder of the skin and subcutaneous tissue, unspecified: Secondary | ICD-10-CM | POA: Diagnosis not present

## 2017-06-13 DIAGNOSIS — R229 Localized swelling, mass and lump, unspecified: Secondary | ICD-10-CM | POA: Diagnosis not present

## 2017-06-13 DIAGNOSIS — C7951 Secondary malignant neoplasm of bone: Secondary | ICD-10-CM | POA: Diagnosis not present

## 2017-06-13 DIAGNOSIS — C50412 Malignant neoplasm of upper-outer quadrant of left female breast: Secondary | ICD-10-CM

## 2017-06-13 MED ORDER — CEPHALEXIN 500 MG PO CAPS: 500.0000 mg | ORAL_CAPSULE | Freq: Four times a day (QID) | ORAL | 0 refills | Status: DC

## 2017-06-13 MED ORDER — VALACYCLOVIR HCL 1 G PO TABS: 1000.0000 mg | ORAL_TABLET | Freq: Two times a day (BID) | ORAL | 0 refills | Status: DC

## 2017-06-13 MED ORDER — PREDNISONE 50 MG PO TABS: ORAL_TABLET | ORAL | 0 refills | Status: DC

## 2017-06-13 NOTE — Assessment & Plan Note (Signed)
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 Was on Arimidex since 08/22/2012 to 03/30/16  Subcutaneous nodule excisionleft chest: Infiltrating carcinoma breast primary, ER positive, PR negative, HER-2 positive  CT CAP and bone scan06/13/2017: Lytic lesions T8 vertebral, T1 posterior element, subcutaneous nodule left lateral chest wall, nonspecific lung nodules; Bone scan: Mets to kull, left humerus, left eighth rib, T7/T8, sternum, left acetabulum. (In retrospect, original breast MRI in 2002 revealed sternal lesions concerning for metastatic disease, PET CT scan did not reveal metastases)  Goals of treatment: Palliation and prolongation of life. Patient understands that stage IV breast cancer cannot be cured but it can be managed with fairly long survivals.  Treatment Plan: Herceptin Q 4 weeks, Lapatinib, Faslodex, Zometa, started 04/28/2016, today is cycle 8  Toxicities: 1. Diarrhea from lapatinib 2. Injection site discomfort from Faslodex ---------------------------------------------------- Urgent visit for rash started 2 weeks ago, initially looked like to bug bite but now has muscular lesion Recommendation: Keflex 500 by mouth 3 times a day for 7 days

## 2017-06-13 NOTE — Progress Notes (Signed)
Patient Care Team: [name], MD as PCP - General (Family Medicine)  DIAGNOSIS:  Encounter Diagnosis  Name Primary?  . Malignant neoplasm of upper-outer quadrant of left breast in female, estrogen receptor positive ([place])     SUMMARY OF ONCOLOGIC HISTORY:   Breast cancer of upper-outer quadrant of left female breast ([name] Riley)   11/14/2011 Surgery    Bilateral mastectomy, prophylactic on the right, left breast IDC 3/18 lymph nodes positive with extracapsular extension ER 89%, PR 81%, HER-2 negative, Ki-67 79% T2 N1 A. stage IIB      12/13/2011 - 06/28/2012 Chemotherapy    4 cycles of FEC followed by 4 cycles of Taxotere      07/17/2012 - 08/22/2012 Radiation Therapy    Adjuvant radiation therapy      08/22/2012 - 03/16/2016 Anti-estrogen oral therapy    Arimidex 1 mg daily      03/16/2016 Relapse/Recurrence    Subcutaneous nodule excision left chest: Infiltrating carcinoma breast primary, ER positive, PR negative      03/29/2016 Imaging    CT CAP and bone scan: Lytic lesions T8 vertebral, T1 posterior element, subcutaneous nodule left lateral chest wall, nonspecific lung nodules; Bone scan: Mets to kull, left humerus, left eighth rib, T7/T8, sternum, left acetabulum      04/28/2016 -  Chemotherapy    Herceptin, lapatinib, Faslodex, Zometa every 4 weeks       CHIEF COMPLIANT: Pustular rash on the arm  INTERVAL HISTORY: [name] is a 66 year old with above-mentioned history metastatic breast cancer who is currently on Herceptin and lapatinib and Faslodex. She presented today for an urgent visit regarding an infected skin lesions. These lesions started on her abdomen and have spread across the abdomen on both sides some of them appear to be grouped and with the [place] papules. There is severely itching and one of them was losing liquid for which she put a Band-Aid on.  REVIEW OF SYSTEMS:   Constitutional: Denies fevers, chills or abnormal weight loss Eyes: Denies  blurriness of vision Ears, nose, mouth, throat, and face: Denies mucositis or sore throat Respiratory: Denies cough, dyspnea or wheezes Cardiovascular: Denies palpitation, chest discomfort Gastrointestinal:  Denies nausea, heartburn or change in bowel habits Skin: Maculopapular pustular lesions on the abdominal wall Lymphatics: Denies new lymphadenopathy or easy bruising Neurological:Denies numbness, tingling or new weaknesses Behavioral/Psych: Mood is stable, no new changes  Extremities: No lower extremity edema Breast:  denies any pain or lumps or nodules in either breasts All other systems were reviewed with the patient and are negative.  I have reviewed the past medical history, past surgical history, social history and family history with the patient and they are unchanged from previous note.  ALLERGIES:  is allergic to contrast media [iodinated diagnostic agents] and food.  MEDICATIONS:  Current Outpatient Prescriptions  Medication Sig Dispense Refill  . acetaminophen (TYLENOL) 500 MG tablet Take 1,000 mg by mouth every 6 (six) hours as needed for moderate pain.    Marland Kitchen aspirin 81 MG tablet Take 81 mg by mouth daily.      . cephALEXin (KEFLEX) 500 MG capsule Take 1 capsule (500 mg total) by mouth 4 (four) times daily. 28 capsule 0  . Cholecalciferol (VITAMIN D-3 PO) Take 2,000 Int'l Units by mouth daily.    Marland Kitchen doxycycline (VIBRAMYCIN) 100 MG capsule Take 1 capsule (100 mg total) by mouth 2 (two) times daily. 20 capsule 0  . lapatinib (TYKERB) 250 MG tablet TAKE 5 TABLETS (1250MG TOTAL) BY  MOUTH DAILY 150 tablet 6  . levothyroxine (SYNTHROID, LEVOTHROID) 75 MCG tablet Take 75 mcg by mouth daily.     Marland Kitchen lidocaine-prilocaine (EMLA) cream Apply to affected area once 30 g 3  . metoprolol succinate (TOPROL-XL) 100 MG 24 hr tablet Take 1 tablet (100 mg total) by mouth daily. 90 tablet 3  . Multiple Vitamins-Minerals (MULTIVITAMIN WITH MINERALS) tablet Take 1 tablet by mouth daily.      .  Multiple Vitamins-Minerals (THERA-M) TABS Take 1 tablet by mouth daily.    . pravastatin (PRAVACHOL) 80 MG tablet Take 80 mg by mouth daily.    . predniSONE (DELTASONE) 50 MG tablet Take 1 tablet 13, 7, and 1 hour prior to CT scan. 5 tablet 0  . valACYclovir (VALTREX) 1000 MG tablet Take 1 tablet (1,000 mg total) by mouth 2 (two) times daily. 14 tablet 0  . Vitamin D, Ergocalciferol, (DRISDOL) 50000 units CAPS capsule Take 50,000 Units by mouth once a week.     No current facility-administered medications for this visit.     PHYSICAL EXAMINATION: ECOG PERFORMANCE STATUS: 1 - Symptomatic but completely ambulatory  Vitals:   06/13/17 0841  BP: (!) 176/87  Pulse: 79  Resp: 18  Temp: 98.5 F (36.9 C)  SpO2: 100%   Filed Weights   06/13/17 0841  Weight: 155 lb 9.6 oz (70.6 kg)    GENERAL:alert, no distress and comfortable SKIN: skin color, texture, turgor are normal, no rashes or significant lesions EYES: normal, Conjunctiva are pink and non-injected, sclera clear OROPHARYNX:no exudate, no erythema and lips, buccal mucosa, and tongue normal  NECK: supple, thyroid normal size, non-tender, without nodularity LYMPH:  no palpable lymphadenopathy in the cervical, axillary or inguinal LUNGS: clear to auscultation and percussion with normal breathing effort HEART: regular rate & rhythm and no murmurs and no lower extremity edema ABDOMEN:Multiple maculopapular lesions in the abdominal wall with erythema and pustular changes. MUSCULOSKELETAL:no cyanosis of digits and no clubbing  NEURO: alert & oriented x 3 with fluent speech, no focal motor/sensory deficits EXTREMITIES: No lower extremity edema   LABORATORY DATA:  I have reviewed the data as listed   Chemistry      Component Value Date/Time   NA 141 05/24/2017 0857   K 3.5 05/24/2017 0857   CL 106 07/03/2016 0939   CL 105 08/10/2012 0908   CO2 26 05/24/2017 0857   BUN 9.0 05/24/2017 0857   CREATININE 0.7 05/24/2017 0857        Component Value Date/Time   CALCIUM 9.8 05/24/2017 0857   ALKPHOS 128 05/24/2017 0857   AST 33 05/24/2017 0857   ALT 27 05/24/2017 0857   BILITOT 0.58 05/24/2017 0857       Lab Results  Component Value Date   WBC 6.0 05/24/2017   HGB 13.0 05/24/2017   HCT 38.5 05/24/2017   MCV 90.0 05/24/2017   PLT 265 05/24/2017   NEUTROABS 3.4 05/24/2017    ASSESSMENT & PLAN:  Breast cancer of upper-outer quadrant of left female breast ([place]) Left breast invasive ductal carcinomaT2, N1, 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 Was on Arimidex since 08/22/2012 to 03/30/16  Subcutaneous nodule excisionleft chest: Infiltrating carcinoma breast primary, ER positive, PR negative, HER-2 positive  CT CAP and bone scan06/13/2017: Lytic lesions T8 vertebral, T1 posterior element, subcutaneous nodule left lateral chest wall,  nonspecific lung nodules; Bone scan: Mets to kull, left humerus, left eighth rib, T7/T8, sternum, left acetabulum. (In retrospect, original breast MRI in 2002 revealed sternal lesions concerning for metastatic disease, PET CT scan did not reveal metastases)  Goals of treatment: Palliation and prolongation of life. Patient understands that stage IV breast cancer cannot be cured but it can be managed with fairly long survivals.  Treatment Plan: Herceptin Q 4 weeks, Lapatinib, Faslodex, Zometa, started 04/28/2016, today is cycle 8  Toxicities: 1. Diarrhea from lapatinib 2. Injection site discomfort from Faslodex ---------------------------------------------------- Urgent visit for rash started 2 weeks ago, initially looked like to bug bite but now has muscular lesion  Recommendation:  Keflex 500 by mouth 3 times a day for 7 days Valtrex 1000 by mouth twice a day for 1 week If these lesions do not improve in a week and I will ask  surgery to biopsy one of these lesions Instructed the patient to call me if she has any worsening of the rash She is going to [place] tomorrow. I will see her back on 07/19/2017 after scans.  I spent 25 minutes talking to the patient of which more than half was spent in counseling and coordination of care.  No orders of the defined types were placed in this encounter.  The patient has a good understanding of the overall plan. she agrees with it. she will call with any problems that may develop before the next visit here.   Rulon Eisenmenger, MD 06/13/17

## 2017-06-20 ENCOUNTER — Other Ambulatory Visit: Payer: Self-pay

## 2017-06-20 ENCOUNTER — Encounter (HOSPITAL_COMMUNITY): Payer: Self-pay

## 2017-06-20 ENCOUNTER — Encounter (HOSPITAL_COMMUNITY)
Admission: RE | Admit: 2017-06-20 | Discharge: 2017-06-20 | Disposition: A | Discharge status: Home / Self Care | Payer: 59 | Source: Ambulatory Visit | Attending: Surgery | Admitting: Surgery

## 2017-06-20 DIAGNOSIS — Z9221 Personal history of antineoplastic chemotherapy: Secondary | ICD-10-CM | POA: Diagnosis not present

## 2017-06-20 DIAGNOSIS — I10 Essential (primary) hypertension: Secondary | ICD-10-CM | POA: Diagnosis not present

## 2017-06-20 DIAGNOSIS — I2510 Atherosclerotic heart disease of native coronary artery without angina pectoris: Secondary | ICD-10-CM | POA: Diagnosis not present

## 2017-06-20 DIAGNOSIS — I252 Old myocardial infarction: Secondary | ICD-10-CM | POA: Diagnosis not present

## 2017-06-20 DIAGNOSIS — I083 Combined rheumatic disorders of mitral, aortic and tricuspid valves: Secondary | ICD-10-CM | POA: Diagnosis not present

## 2017-06-20 DIAGNOSIS — Z7982 Long term (current) use of aspirin: Secondary | ICD-10-CM | POA: Diagnosis not present

## 2017-06-20 DIAGNOSIS — Z91018 Allergy to other foods: Secondary | ICD-10-CM | POA: Diagnosis not present

## 2017-06-20 DIAGNOSIS — Z923 Personal history of irradiation: Secondary | ICD-10-CM | POA: Diagnosis not present

## 2017-06-20 DIAGNOSIS — Z87442 Personal history of urinary calculi: Secondary | ICD-10-CM | POA: Diagnosis not present

## 2017-06-20 DIAGNOSIS — F419 Anxiety disorder, unspecified: Secondary | ICD-10-CM | POA: Diagnosis not present

## 2017-06-20 DIAGNOSIS — E039 Hypothyroidism, unspecified: Secondary | ICD-10-CM | POA: Diagnosis not present

## 2017-06-20 DIAGNOSIS — Z79899 Other long term (current) drug therapy: Secondary | ICD-10-CM | POA: Diagnosis not present

## 2017-06-20 DIAGNOSIS — E7800 Pure hypercholesterolemia, unspecified: Secondary | ICD-10-CM | POA: Diagnosis not present

## 2017-06-20 DIAGNOSIS — C50912 Malignant neoplasm of unspecified site of left female breast: Secondary | ICD-10-CM | POA: Diagnosis not present

## 2017-06-20 DIAGNOSIS — Z9013 Acquired absence of bilateral breasts and nipples: Secondary | ICD-10-CM | POA: Diagnosis not present

## 2017-06-20 DIAGNOSIS — Z91041 Radiographic dye allergy status: Secondary | ICD-10-CM | POA: Diagnosis not present

## 2017-06-20 LAB — CBC
HCT: 40.3 % (ref 36.0–46.0)
Hemoglobin: 13.3 g/dL (ref 12.0–15.0)
MCH: 29.9 pg (ref 26.0–34.0)
MCHC: 33 g/dL (ref 30.0–36.0)
MCV: 90.6 fL (ref 78.0–100.0)
Platelets: 280 10*3/uL (ref 150–400)
RBC: 4.45 MIL/uL (ref 3.87–5.11)
RDW: 13.2 % (ref 11.5–15.5)
WBC: 6.7 10*3/uL (ref 4.0–10.5)

## 2017-06-20 LAB — BASIC METABOLIC PANEL
Anion gap: 5 (ref 5–15)
BUN: 10 mg/dL (ref 6–20)
CO2: 31 mmol/L (ref 22–32)
Calcium: 9.5 mg/dL (ref 8.9–10.3)
Chloride: 103 mmol/L (ref 101–111)
Creatinine, Ser: 0.62 mg/dL (ref 0.44–1.00)
GFR calc Af Amer: >60 mL/min (ref >60)
GFR calc non Af Amer: >60 mL/min (ref >60)
Glucose, Bld: 92 mg/dL (ref 65–99)
Potassium: 3.1 mmol/L — ABNORMAL LOW (ref 3.5–5.1)
Sodium: 139 mmol/L (ref 135–145)

## 2017-06-20 NOTE — Progress Notes (Addendum)
Anesthesia Chart Review:  Pt is a 66 year old female scheduled for excision of chest wall masses due to metastatic breast cancer on 06/22/2017 with Donnie Mesa, MD  - PCP is [name], MD - Oncologist is Nicholas Lose, MD - Used to see cardiologist Minus Breeding, MD, last office visit 12/20/11.  Last cardiology eval 10/24/14 by Kirk Ruths, MD during hospitalization for chest pain (stress test done at that time was normal, chest pain thought atypical); discharge instructions indicate pt was to f/u with cardiology but this has not happened.   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 Never smoker. BMI 23. S/p modified radical mastectomy 11/14/11.   Medications include: ASA 81 mg, lapatinib, levothyroxine, metoprolol, prednisone  Preoperative labs reviewed.    CT chest 01/24/17:  1. Stable exam.  No new or progressive findings. 2. Stable appearance soft tissue nodule left lateral chest wall. 3. Sclerotic bone lesions in the axial skeleton and bony pelvis, stable. 4. Bilateral well-defined low-density renal lesions, some of which have attenuation higher than would be expected for simple cyst. While these may be cysts complicated by proteinaceous debris or hemorrhage, continued attention on follow-up recommended as neoplasm not completely excluded.  EKG 06/20/17: NSR. Possible Septal infarct, age undetermined. Septal Q waves intermittently present on EKG since 11/07/11.   Echo 01/31/17 (done for chemo monitoring):  - Left ventricle: The cavity size was normal. Wall thickness was increased in a pattern of mild LVH. Systolic function was normal. The estimated ejection fraction was in the range of 60% to 65%. Wall motion was normal; there were no regional wall motion abnormalities. Doppler parameters are consistent with abnormal left ventricular relaxation (grade 1 diastolic dysfunction). - Aortic  valve: There was mild regurgitation. - Mitral valve: There was mild regurgitation. - Left atrium: The atrium was mildly dilated. - Tricuspid valve: There was mild regurgitation.  Nuclear stress test 10/24/14:  1. No definitive scintigraphic evidence of prior infarction or pharmacologically induced ischemia. 2. Normal left ventricular wall motion. 3. Left ventricular ejection fraction 61% 4. Low-risk stress test findings  Cardiac cath 09/26/00:  - LM: Normal - LAD: Normal in its proximal segment. Mid segment there was a dissection plane with subsequent occlusion of the distal and apical LAD. - CX: Normal - RCA: Dominant, normal  I spoke with pt by telephone.  She reports she can easily climb the stairs in her house to the 2nd floor, clean her house (mop, vacuum, etc) without CP or SOB.  She denies having CV sx with activity or at rest.   I reviewed case with Dr. Roanna Banning.   If no changes, I anticipate pt can proceed with surgery as scheduled.   Willeen Cass, FNP-BC [facility]/Anesthesiology Phone: [phone] 06/21/2017 12:50 PM

## 2017-06-20 NOTE — Pre-Procedure Instructions (Addendum)
    [name]  06/20/2017      [facility] - [place], [place] [address] Phone: [phone] Fax: [place], [place] - [address] [place] [address] Phone: [phone] Fax: [place], [place] [address] [place] [address] Phone: [phone] Fax: [phone]    Your procedure is scheduled on 06/22/17.  Report to [facility] Admitting at 530 A.M.  Call this number if you have problems the morning of surgery:  [phone]   Remember:  Do not eat food or drink liquids after midnight.   Drink Boost Breeze @ 3:30  Take these medicines the morning of surgery with A SIP OF WATER ---tylenol if needed, synthroid, metoprolol,                Stop: aspirin, aleve, motrin, ibuprofen, advil, Goody's, BC Powders, vitamins and herbal  Medicine.   Do not wear jewelry, make-up or nail polish.  Do not wear lotions, powders, or perfumes, or deoderant.  Do not shave 48 hours prior to surgery.  Men may shave face and neck.  Do not bring valuables to the hospital.  [facility] is not responsible for any belongings or valuables.  Contacts, dentures or bridgework may not be worn into surgery.  Leave your suitcase in the car.  After surgery it may be brought to your room.  For patients admitted to the hospital, discharge time will be determined by your treatment team.  Patients discharged the day of surgery will not be allowed to drive home.   Name and phone number of your driver:    Special instructions:  Do not take any aspirin,anti-inflammatories,vitamins,or herbal supplements 5-7 days prior to surgery.  Please read over the following fact sheets that you were given.

## 2017-06-20 NOTE — Progress Notes (Signed)
PCP: Dr. [name]  Cardiologist: Dr. Caryl Pina. Reports she hasn't seen him 5 yrs. She stated he released her.   Oncologist: Dr. Lindi Adie

## 2017-06-21 ENCOUNTER — Ambulatory Visit (HOSPITAL_BASED_OUTPATIENT_CLINIC_OR_DEPARTMENT_OTHER): Payer: Managed Care, Other (non HMO)

## 2017-06-21 VITALS — BP 152/80 | HR 72 | Temp 98.7°F | Resp 18

## 2017-06-21 DIAGNOSIS — Z5112 Encounter for antineoplastic immunotherapy: Secondary | ICD-10-CM | POA: Diagnosis not present

## 2017-06-21 DIAGNOSIS — C773 Secondary and unspecified malignant neoplasm of axilla and upper limb lymph nodes: Secondary | ICD-10-CM | POA: Diagnosis not present

## 2017-06-21 DIAGNOSIS — C7951 Secondary malignant neoplasm of bone: Secondary | ICD-10-CM | POA: Diagnosis not present

## 2017-06-21 DIAGNOSIS — C50412 Malignant neoplasm of upper-outer quadrant of left female breast: Secondary | ICD-10-CM

## 2017-06-21 DIAGNOSIS — Z170 Estrogen receptor positive status [ER+]: Secondary | ICD-10-CM

## 2017-06-21 MED ORDER — ACETAMINOPHEN 325 MG PO TABS
650.0000 mg | ORAL_TABLET | Freq: Once | ORAL | Status: AC
  Administered 2017-06-21: 650 mg via ORAL

## 2017-06-21 MED ORDER — FULVESTRANT 250 MG/5ML IM SOLN
500.0000 mg | INTRAMUSCULAR | Status: DC
  Administered 2017-06-21: 500 mg via INTRAMUSCULAR
  Filled 2017-06-21: qty 10

## 2017-06-21 MED ORDER — CELECOXIB 200 MG PO CAPS
400.0000 mg | ORAL_CAPSULE | ORAL | Status: AC
  Administered 2017-06-22: 400 mg via ORAL
  Filled 2017-06-21: qty 2

## 2017-06-21 MED ORDER — SODIUM CHLORIDE 0.9% FLUSH
10.0000 mL | INTRAVENOUS | Status: DC | PRN
  Administered 2017-06-21: 10 mL
  Filled 2017-06-21: qty 10

## 2017-06-21 MED ORDER — ZOLEDRONIC ACID 4 MG/100ML IV SOLN
4.0000 mg | Freq: Once | INTRAVENOUS | Status: AC
  Administered 2017-06-21: 4 mg via INTRAVENOUS
  Filled 2017-06-21: qty 100

## 2017-06-21 MED ORDER — HEPARIN SOD (PORK) LOCK FLUSH 100 UNIT/ML IV SOLN
500.0000 [IU] | Freq: Once | INTRAVENOUS | Status: AC | PRN
  Administered 2017-06-21: 500 [IU]
  Filled 2017-06-21: qty 5

## 2017-06-21 MED ORDER — ACETAMINOPHEN 500 MG PO TABS
1000.0000 mg | ORAL_TABLET | ORAL | Status: AC
  Administered 2017-06-22: 1000 mg via ORAL
  Filled 2017-06-21: qty 2

## 2017-06-21 MED ORDER — ACETAMINOPHEN 325 MG PO TABS
ORAL_TABLET | ORAL | Status: AC
  Filled 2017-06-21: qty 2

## 2017-06-21 MED ORDER — DIPHENHYDRAMINE HCL 25 MG PO CAPS
ORAL_CAPSULE | ORAL | Status: AC
  Filled 2017-06-21: qty 2

## 2017-06-21 MED ORDER — SODIUM CHLORIDE 0.9 % IV SOLN
Freq: Once | INTRAVENOUS | Status: AC
  Administered 2017-06-21: 09:00:00 via INTRAVENOUS

## 2017-06-21 MED ORDER — GABAPENTIN 300 MG PO CAPS
300.0000 mg | ORAL_CAPSULE | ORAL | Status: AC
  Administered 2017-06-22: 300 mg via ORAL
  Filled 2017-06-21: qty 1

## 2017-06-21 MED ORDER — TRASTUZUMAB CHEMO INJECTION 440 MG
450.0000 mg | Freq: Once | INTRAVENOUS | Status: AC
  Administered 2017-06-21: 450 mg via INTRAVENOUS
  Filled 2017-06-21: qty 21.43

## 2017-06-21 MED ORDER — DIPHENHYDRAMINE HCL 25 MG PO CAPS
50.0000 mg | ORAL_CAPSULE | Freq: Once | ORAL | Status: AC
  Administered 2017-06-21: 50 mg via ORAL

## 2017-06-21 MED ORDER — CEFAZOLIN SODIUM-DEXTROSE 2-4 GM/100ML-% IV SOLN
2.0000 g | INTRAVENOUS | Status: AC
  Administered 2017-06-22: 2 g via INTRAVENOUS
  Filled 2017-06-21: qty 100

## 2017-06-21 NOTE — Patient Instructions (Signed)
[facility] Discharge Instructions for Patients Receiving Chemotherapy  Today you received the following chemotherapy agents Herceptin  To help prevent nausea and vomiting after your treatment, we encourage you to take your nausea medication as directed.    If you develop nausea and vomiting that is not controlled by your nausea medication, call the clinic.   BELOW ARE SYMPTOMS THAT SHOULD BE REPORTED IMMEDIATELY:  *FEVER GREATER THAN 100.5 F  *CHILLS WITH OR WITHOUT FEVER  NAUSEA AND VOMITING THAT IS NOT CONTROLLED WITH YOUR NAUSEA MEDICATION  *UNUSUAL SHORTNESS OF BREATH  *UNUSUAL BRUISING OR BLEEDING  TENDERNESS IN MOUTH AND THROAT WITH OR WITHOUT PRESENCE OF ULCERS  *URINARY PROBLEMS  *BOWEL PROBLEMS  UNUSUAL RASH Items with * indicate a potential emergency and should be followed up as soon as possible.  Feel free to call the clinic you have any questions or concerns. The clinic phone number is [phone].  Please show the [place] at check-in to the Emergency Department and triage nurse.  Zoledronic Acid injection (Hypercalcemia, Oncology) What is this medicine? ZOLEDRONIC ACID (ZOE le dron ik AS id) lowers the amount of calcium loss from bone. It is used to treat too much calcium in your blood from cancer. It is also used to prevent complications of cancer that has spread to the bone. This medicine may be used for other purposes; ask your health care provider or pharmacist if you have questions. COMMON BRAND NAME(S): Zometa What should I tell my health care provider before I take this medicine? They need to know if you have any of these conditions: -aspirin-sensitive asthma -cancer, especially if you are receiving medicines used to treat cancer -dental disease or wear dentures -infection -kidney disease -receiving corticosteroids like dexamethasone or prednisone -an unusual or allergic reaction to zoledronic acid, other medicines, foods,  dyes, or preservatives -pregnant or trying to get pregnant -breast-feeding How should I use this medicine? This medicine is for infusion into a vein. It is given by a health care professional in a hospital or clinic setting. Talk to your pediatrician regarding the use of this medicine in children. Special care may be needed. Overdosage: If you think you have taken too much of this medicine contact a poison control center or emergency room at once. NOTE: This medicine is only for you. Do not share this medicine with others. What if I miss a dose? It is important not to miss your dose. Call your doctor or health care professional if you are unable to keep an appointment. What may interact with this medicine? -certain antibiotics given by injection -NSAIDs, medicines for pain and inflammation, like ibuprofen or naproxen -some diuretics like bumetanide, furosemide -teriparatide -thalidomide This list may not describe all possible interactions. Give your health care provider a list of all the medicines, herbs, non-prescription drugs, or dietary supplements you use. Also tell them if you smoke, drink alcohol, or use illegal drugs. Some items may interact with your medicine. What should I watch for while using this medicine? Visit your doctor or health care professional for regular checkups. It may be some time before you see the benefit from this medicine. Do not stop taking your medicine unless your doctor tells you to. Your doctor may order blood tests or other tests to see how you are doing. Women should inform their doctor if they wish to become pregnant or think they might be pregnant. There is a potential for serious side effects to an unborn child. Talk to  your health care professional or pharmacist for more information. You should make sure that you get enough calcium and vitamin D while you are taking this medicine. Discuss the foods you eat and the vitamins you take with your health care  professional. Some people who take this medicine have severe bone, joint, and/or muscle pain. This medicine may also increase your risk for jaw problems or a broken thigh bone. Tell your doctor right away if you have severe pain in your jaw, bones, joints, or muscles. Tell your doctor if you have any pain that does not go away or that gets worse. Tell your dentist and dental surgeon that you are taking this medicine. You should not have major dental surgery while on this medicine. See your dentist to have a dental exam and fix any dental problems before starting this medicine. Take good care of your teeth while on this medicine. Make sure you see your dentist for regular follow-up appointments. What side effects may I notice from receiving this medicine? Side effects that you should report to your doctor or health care professional as soon as possible: -allergic reactions like skin rash, itching or hives, swelling of the face, lips, or tongue -anxiety, confusion, or depression -breathing problems -changes in vision -eye pain -feeling faint or lightheaded, falls -jaw pain, especially after dental work -mouth sores -muscle cramps, stiffness, or weakness -redness, blistering, peeling or loosening of the skin, including inside the mouth -trouble passing urine or change in the amount of urine Side effects that usually do not require medical attention (report to your doctor or health care professional if they continue or are bothersome): -bone, joint, or muscle pain -constipation -diarrhea -fever -hair loss -irritation at site where injected -loss of appetite -nausea, vomiting -stomach upset -trouble sleeping -trouble swallowing -weak or tired This list may not describe all possible side effects. Call your doctor for medical advice about side effects. You may report side effects to FDA at 1-800-FDA-1088. Where should I keep my medicine? This drug is given in a hospital or clinic and will not  be stored at home. NOTE: This sheet is a summary. It may not cover all possible information. If you have questions about this medicine, talk to your doctor, pharmacist, or health care provider.  2018 Elsevier/Gold Standard (2014-03-01 14:19:39)  Fulvestrant injection What is this medicine? FULVESTRANT (ful VES trant) blocks the effects of estrogen. It is used to treat breast cancer. This medicine may be used for other purposes; ask your health care provider or pharmacist if you have questions. COMMON BRAND NAME(S): FASLODEX What should I tell my health care provider before I take this medicine? They need to know if you have any of these conditions: -bleeding problems -liver disease -low levels of platelets in the blood -an unusual or allergic reaction to fulvestrant, other medicines, foods, dyes, or preservatives -pregnant or trying to get pregnant -breast-feeding How should I use this medicine? This medicine is for injection into a muscle. It is usually given by a health care professional in a hospital or clinic setting. Talk to your pediatrician regarding the use of this medicine in children. Special care may be needed. Overdosage: If you think you have taken too much of this medicine contact a poison control center or emergency room at once. NOTE: This medicine is only for you. Do not share this medicine with others. What if I miss a dose? It is important not to miss your dose. Call your doctor or health care professional if  you are unable to keep an appointment. What may interact with this medicine? -medicines that treat or prevent blood clots like warfarin, enoxaparin, and dalteparin This list may not describe all possible interactions. Give your health care provider a list of all the medicines, herbs, non-prescription drugs, or dietary supplements you use. Also tell them if you smoke, drink alcohol, or use illegal drugs. Some items may interact with your medicine. What should I  watch for while using this medicine? Your condition will be monitored carefully while you are receiving this medicine. You will need important blood work done while you are taking this medicine. Do not become pregnant while taking this medicine or for at least 1 year after stopping it. Women of child-bearing potential will need to have a negative pregnancy test before starting this medicine. Women should inform their doctor if they wish to become pregnant or think they might be pregnant. There is a potential for serious side effects to an unborn child. Men should inform their doctors if they wish to father a child. This medicine may lower sperm counts. Talk to your health care professional or pharmacist for more information. Do not breast-feed an infant while taking this medicine or for 1 year after the last dose. What side effects may I notice from receiving this medicine? Side effects that you should report to your doctor or health care professional as soon as possible: -allergic reactions like skin rash, itching or hives, swelling of the face, lips, or tongue -feeling faint or lightheaded, falls -pain, tingling, numbness, or weakness in the legs -signs and symptoms of infection like fever or chills; cough; flu-like symptoms; sore throat -vaginal bleeding Side effects that usually do not require medical attention (report to your doctor or health care professional if they continue or are bothersome): -aches, pains -constipation -diarrhea -headache -hot flashes -nausea, vomiting -pain at site where injected -stomach pain This list may not describe all possible side effects. Call your doctor for medical advice about side effects. You may report side effects to FDA at 1-800-FDA-1088. Where should I keep my medicine? This drug is given in a hospital or clinic and will not be stored at home. NOTE: This sheet is a summary. It may not cover all possible information. If you have questions about this  medicine, talk to your doctor, pharmacist, or health care provider.  2018 Elsevier/Gold Standard (2015-05-01 11:03:55)

## 2017-06-21 NOTE — Anesthesia Preprocedure Evaluation (Addendum)
Anesthesia Evaluation  Patient identified by MRN, date of birth, ID band Patient awake    Reviewed: Allergy & Precautions, H&P , NPO status , Patient's Chart, lab work & pertinent test results, reviewed documented beta blocker date and time   History of Anesthesia Complications (+) PONV and history of anesthetic complications  Airway Mallampati: I  TM Distance: >3 FB Neck ROM: Full    Dental  (+) Dental Advisory Given   Pulmonary    breath sounds clear to auscultation       Cardiovascular hypertension, Pt. on medications and Pt. on home beta blockers + CAD and + Past MI   Rhythm:Regular Rate:Normal     Neuro/Psych    GI/Hepatic   Endo/Other  Hypothyroidism   Renal/GU      Musculoskeletal   Abdominal   Peds  Hematology   Anesthesia Other Findings Echo 01/31/17 (done for chemo monitoring):  - Left ventricle: The cavity size was normal. Wall thickness wasincreased in a pattern of mild LVH. Systolic function was normal.The estimated ejection fraction was in the range of 60% to 65%.Wall motion was normal; there were no regional wall motionabnormalities. Doppler parameters are consistent with abnormalleft ventricular relaxation (grade 1 diastolic dysfunction). - Aortic valve: There was mild regurgitation. - Mitral valve: There was mild regurgitation. - Left atrium: The atrium was mildly dilated. - Tricuspid valve: There was mild regurgitation.  Nuclear stress test 10/24/14:  1. No definitive scintigraphic evidence of prior infarction or pharmacologically induced ischemia. 2. Normal left ventricular wall motion. 3. Left ventricular ejection fraction 61% 4. Low-risk stress test findings  Cardiac cath 09/26/00:  - LM: Normal - LAD: Normal in its proximal segment. Mid segment there was a dissection plane with subsequent occlusion of the distal and apical LAD. - CX: Normal - RCA: Dominant, normal  Reproductive/Obstetrics                             Anesthesia Physical  Anesthesia Plan  ASA: III  Anesthesia Plan: General   Post-op Pain Management:    Induction: Intravenous  PONV Risk Score and Plan: 3 and Ondansetron, Dexamethasone, Midazolam, Treatment may vary due to age or medical condition and Scopolamine patch - Pre-op  Airway Management Planned: LMA and Oral ETT  Additional Equipment:   Intra-op Plan:   Post-operative Plan: Extubation in OR  Informed Consent:   Dental advisory given  Plan Discussed with: CRNA and Anesthesiologist  Anesthesia Plan Comments:         Anesthesia Quick Evaluation

## 2017-06-22 ENCOUNTER — Ambulatory Visit (HOSPITAL_COMMUNITY)
Admission: RE | Admit: 2017-06-22 | Discharge: 2017-06-22 | Disposition: A | Discharge status: Home / Self Care | Payer: 59 | Source: Ambulatory Visit | Attending: Surgery | Admitting: Surgery

## 2017-06-22 ENCOUNTER — Ambulatory Visit (HOSPITAL_COMMUNITY): Payer: 59 | Admitting: Anesthesiology

## 2017-06-22 ENCOUNTER — Encounter (HOSPITAL_COMMUNITY): Payer: Self-pay | Admitting: *Deleted

## 2017-06-22 ENCOUNTER — Ambulatory Visit (HOSPITAL_COMMUNITY): Payer: 59 | Admitting: Emergency Medicine

## 2017-06-22 ENCOUNTER — Encounter (HOSPITAL_COMMUNITY)
Admission: RE | Disposition: A | Discharge status: Home / Self Care | Payer: Self-pay | Source: Ambulatory Visit | Attending: Surgery

## 2017-06-22 DIAGNOSIS — Z91018 Allergy to other foods: Secondary | ICD-10-CM | POA: Insufficient documentation

## 2017-06-22 DIAGNOSIS — I2510 Atherosclerotic heart disease of native coronary artery without angina pectoris: Secondary | ICD-10-CM | POA: Insufficient documentation

## 2017-06-22 DIAGNOSIS — I083 Combined rheumatic disorders of mitral, aortic and tricuspid valves: Secondary | ICD-10-CM | POA: Insufficient documentation

## 2017-06-22 DIAGNOSIS — I252 Old myocardial infarction: Secondary | ICD-10-CM | POA: Insufficient documentation

## 2017-06-22 DIAGNOSIS — Z9013 Acquired absence of bilateral breasts and nipples: Secondary | ICD-10-CM | POA: Insufficient documentation

## 2017-06-22 DIAGNOSIS — E7800 Pure hypercholesterolemia, unspecified: Secondary | ICD-10-CM | POA: Insufficient documentation

## 2017-06-22 DIAGNOSIS — Z91041 Radiographic dye allergy status: Secondary | ICD-10-CM | POA: Insufficient documentation

## 2017-06-22 DIAGNOSIS — Z7982 Long term (current) use of aspirin: Secondary | ICD-10-CM | POA: Insufficient documentation

## 2017-06-22 DIAGNOSIS — I10 Essential (primary) hypertension: Secondary | ICD-10-CM | POA: Insufficient documentation

## 2017-06-22 DIAGNOSIS — Z923 Personal history of irradiation: Secondary | ICD-10-CM | POA: Insufficient documentation

## 2017-06-22 DIAGNOSIS — E039 Hypothyroidism, unspecified: Secondary | ICD-10-CM | POA: Insufficient documentation

## 2017-06-22 DIAGNOSIS — C50912 Malignant neoplasm of unspecified site of left female breast: Secondary | ICD-10-CM | POA: Insufficient documentation

## 2017-06-22 DIAGNOSIS — F419 Anxiety disorder, unspecified: Secondary | ICD-10-CM | POA: Insufficient documentation

## 2017-06-22 DIAGNOSIS — Z9221 Personal history of antineoplastic chemotherapy: Secondary | ICD-10-CM | POA: Insufficient documentation

## 2017-06-22 DIAGNOSIS — Z87442 Personal history of urinary calculi: Secondary | ICD-10-CM | POA: Insufficient documentation

## 2017-06-22 DIAGNOSIS — Z79899 Other long term (current) drug therapy: Secondary | ICD-10-CM | POA: Insufficient documentation

## 2017-06-22 HISTORY — PX: MASS EXCISION: SHX2000

## 2017-06-22 SURGERY — EXCISION MASS
Anesthesia: General | Site: Chest | Laterality: Left

## 2017-06-22 MED ORDER — BUPIVACAINE-EPINEPHRINE 0.25% -1:200000 IJ SOLN
INTRAMUSCULAR | Status: DC | PRN
  Administered 2017-06-22: 17 mL

## 2017-06-22 MED ORDER — FENTANYL CITRATE (PF) 100 MCG/2ML IJ SOLN
INTRAMUSCULAR | Status: AC
  Filled 2017-06-22: qty 2

## 2017-06-22 MED ORDER — CELECOXIB 200 MG PO CAPS
ORAL_CAPSULE | ORAL | Status: AC
  Filled 2017-06-22: qty 1

## 2017-06-22 MED ORDER — LACTATED RINGERS IV SOLN
INTRAVENOUS | Status: DC | PRN
  Administered 2017-06-22: 07:00:00 via INTRAVENOUS

## 2017-06-22 MED ORDER — PROPOFOL 10 MG/ML IV BOLUS
INTRAVENOUS | Status: AC
  Filled 2017-06-22: qty 20

## 2017-06-22 MED ORDER — ONDANSETRON HCL 4 MG/2ML IJ SOLN
INTRAMUSCULAR | Status: AC
  Filled 2017-06-22: qty 2

## 2017-06-22 MED ORDER — SCOPOLAMINE 1 MG/3DAYS TD PT72
MEDICATED_PATCH | TRANSDERMAL | Status: DC | PRN
  Administered 2017-06-22: 1 via TRANSDERMAL

## 2017-06-22 MED ORDER — FENTANYL CITRATE (PF) 100 MCG/2ML IJ SOLN
25.0000 ug | INTRAMUSCULAR | Status: DC | PRN
  Administered 2017-06-22: 50 ug via INTRAVENOUS

## 2017-06-22 MED ORDER — FENTANYL CITRATE (PF) 250 MCG/5ML IJ SOLN
INTRAMUSCULAR | Status: DC | PRN
  Administered 2017-06-22: 100 ug via INTRAVENOUS

## 2017-06-22 MED ORDER — LIDOCAINE HCL (CARDIAC) 20 MG/ML IV SOLN
INTRAVENOUS | Status: DC | PRN
  Administered 2017-06-22: 50 mg via INTRATRACHEAL

## 2017-06-22 MED ORDER — ACETAMINOPHEN 325 MG PO TABS
650.0000 mg | ORAL_TABLET | Freq: Once | ORAL | Status: AC
  Administered 2017-06-22: 650 mg via ORAL

## 2017-06-22 MED ORDER — MEPERIDINE HCL 25 MG/ML IJ SOLN: 6.2500 mg | INTRAMUSCULAR | Status: DC | PRN

## 2017-06-22 MED ORDER — 0.9 % SODIUM CHLORIDE (POUR BTL) OPTIME
TOPICAL | Status: DC | PRN
  Administered 2017-06-22: 1000 mL

## 2017-06-22 MED ORDER — MIDAZOLAM HCL 2 MG/2ML IJ SOLN
INTRAMUSCULAR | Status: AC
  Filled 2017-06-22: qty 2

## 2017-06-22 MED ORDER — CHLORHEXIDINE GLUCONATE CLOTH 2 % EX PADS: 6.0000 | MEDICATED_PAD | Freq: Once | CUTANEOUS | Status: DC

## 2017-06-22 MED ORDER — PHENYLEPHRINE HCL 10 MG/ML IJ SOLN
INTRAMUSCULAR | Status: DC | PRN
  Administered 2017-06-22: 80 ug via INTRAVENOUS
  Administered 2017-06-22: 60 ug via INTRAVENOUS

## 2017-06-22 MED ORDER — ONDANSETRON HCL 4 MG/2ML IJ SOLN
4.0000 mg | Freq: Once | INTRAMUSCULAR | Status: AC | PRN
  Administered 2017-06-22: 4 mg via INTRAVENOUS

## 2017-06-22 MED ORDER — EPHEDRINE SULFATE 50 MG/ML IJ SOLN
INTRAMUSCULAR | Status: DC | PRN
  Administered 2017-06-22: 10 mg via INTRAVENOUS
  Administered 2017-06-22: 5 mg via INTRAVENOUS
  Administered 2017-06-22 (×2): 10 mg via INTRAVENOUS

## 2017-06-22 MED ORDER — PROPOFOL 10 MG/ML IV BOLUS
INTRAVENOUS | Status: DC | PRN
  Administered 2017-06-22: 150 mg via INTRAVENOUS

## 2017-06-22 MED ORDER — OXYCODONE HCL 5 MG PO TABS
5.0000 mg | ORAL_TABLET | Freq: Once | ORAL | Status: AC
  Administered 2017-06-22: 10 mg via ORAL

## 2017-06-22 MED ORDER — ACETAMINOPHEN 325 MG PO TABS
ORAL_TABLET | ORAL | Status: AC
  Filled 2017-06-22: qty 2

## 2017-06-22 MED ORDER — FENTANYL CITRATE (PF) 250 MCG/5ML IJ SOLN
INTRAMUSCULAR | Status: AC
  Filled 2017-06-22: qty 5

## 2017-06-22 MED ORDER — HYDROCODONE-ACETAMINOPHEN 5-325 MG PO TABS: 1.0000 | ORAL_TABLET | ORAL | 0 refills | Status: DC | PRN

## 2017-06-22 MED ORDER — MIDAZOLAM HCL 5 MG/5ML IJ SOLN
INTRAMUSCULAR | Status: DC | PRN
  Administered 2017-06-22: 2 mg via INTRAVENOUS

## 2017-06-22 MED ORDER — BUPIVACAINE-EPINEPHRINE (PF) 0.25% -1:200000 IJ SOLN
INTRAMUSCULAR | Status: AC
  Filled 2017-06-22: qty 30

## 2017-06-22 MED ORDER — SCOPOLAMINE 1 MG/3DAYS TD PT72
MEDICATED_PATCH | TRANSDERMAL | Status: AC
  Filled 2017-06-22: qty 1

## 2017-06-22 MED ORDER — ONDANSETRON HCL 4 MG/2ML IJ SOLN
INTRAMUSCULAR | Status: DC | PRN
  Administered 2017-06-22: 4 mg via INTRAVENOUS

## 2017-06-22 MED ORDER — OXYCODONE HCL 5 MG PO TABS
ORAL_TABLET | ORAL | Status: AC
  Filled 2017-06-22: qty 2

## 2017-06-22 SURGICAL SUPPLY — 40 items
BENZOIN TINCTURE PRP APPL 2/3 (GAUZE/BANDAGES/DRESSINGS) IMPLANT
BLADE CLIPPER SURG (BLADE) IMPLANT
CHLORAPREP W/TINT 26ML (MISCELLANEOUS) IMPLANT
CLOSURE WOUND 1/2 X4 (GAUZE/BANDAGES/DRESSINGS)
COVER SURGICAL LIGHT HANDLE (MISCELLANEOUS) ×3 IMPLANT
DRAPE LAPAROTOMY 100X72 PEDS (DRAPES) ×3 IMPLANT
DRAPE UTILITY XL STRL (DRAPES) ×3 IMPLANT
DRSG TEGADERM 4X4.75 (GAUZE/BANDAGES/DRESSINGS) IMPLANT
ELECT CAUTERY BLADE 6.4 (BLADE) ×3 IMPLANT
ELECT REM PT RETURN 9FT ADLT (ELECTROSURGICAL) ×3
ELECTRODE REM PT RTRN 9FT ADLT (ELECTROSURGICAL) ×1 IMPLANT
GAUZE SPONGE 4X4 12PLY STRL (GAUZE/BANDAGES/DRESSINGS) ×3 IMPLANT
GAUZE XEROFORM 5X9 LF (GAUZE/BANDAGES/DRESSINGS) ×3 IMPLANT
GLOVE BIO SURGEON STRL SZ7 (GLOVE) ×3 IMPLANT
GLOVE BIOGEL PI IND STRL 7.5 (GLOVE) ×1 IMPLANT
GLOVE BIOGEL PI INDICATOR 7.5 (GLOVE) ×2
GOWN STRL REUS W/ TWL LRG LVL3 (GOWN DISPOSABLE) ×2 IMPLANT
GOWN STRL REUS W/TWL LRG LVL3 (GOWN DISPOSABLE) ×4
KIT BASIN OR (CUSTOM PROCEDURE TRAY) ×3 IMPLANT
KIT ROOM TURNOVER OR (KITS) ×3 IMPLANT
NEEDLE HYPO 25GX1X1/2 BEV (NEEDLE) ×3 IMPLANT
NS IRRIG 1000ML POUR BTL (IV SOLUTION) ×3 IMPLANT
PACK SURGICAL SETUP 50X90 (CUSTOM PROCEDURE TRAY) ×3 IMPLANT
PAD ABD 8X10 STRL (GAUZE/BANDAGES/DRESSINGS) ×3 IMPLANT
PAD ARMBOARD 7.5X6 YLW CONV (MISCELLANEOUS) ×3 IMPLANT
PENCIL BUTTON HOLSTER BLD 10FT (ELECTRODE) ×3 IMPLANT
SPECIMEN JAR SMALL (MISCELLANEOUS) ×3 IMPLANT
SPONGE LAP 18X18 X RAY DECT (DISPOSABLE) ×3 IMPLANT
STAPLER VISISTAT 35W (STAPLE) ×3 IMPLANT
STRIP CLOSURE SKIN 1/2X4 (GAUZE/BANDAGES/DRESSINGS) IMPLANT
SUT MNCRL AB 4-0 PS2 18 (SUTURE) ×3 IMPLANT
SUT VIC AB 2-0 SH 27 (SUTURE) ×2
SUT VIC AB 2-0 SH 27X BRD (SUTURE) ×1 IMPLANT
SUT VIC AB 3-0 SH 18 (SUTURE) ×3 IMPLANT
SUT VIC AB 3-0 SH 27 (SUTURE) ×2
SUT VIC AB 3-0 SH 27XBRD (SUTURE) ×1 IMPLANT
SYR BULB 3OZ (MISCELLANEOUS) ×3 IMPLANT
SYR CONTROL 10ML LL (SYRINGE) ×3 IMPLANT
TOWEL OR 17X24 6PK STRL BLUE (TOWEL DISPOSABLE) ×3 IMPLANT
TOWEL OR 17X26 10 PK STRL BLUE (TOWEL DISPOSABLE) ×3 IMPLANT

## 2017-06-22 NOTE — Discharge Instructions (Signed)
[name] [phone]   POST OP INSTRUCTIONS  Always review your discharge instruction sheet given to you by the facility where your surgery was performed. IF YOU HAVE DISABILITY OR FAMILY LEAVE FORMS, YOU MUST BRING THEM TO THE OFFICE FOR PROCESSING.   DO NOT GIVE THEM TO YOUR DOCTOR. A prescription for pain medication may be given to you upon discharge.  Take your pain medication as prescribed, if needed.  If narcotic pain medicine is not needed, then you may take acetaminophen (Tylenol) or ibuprofen (Advil) as needed. 1. Take your usually prescribed medications unless otherwise directed. 2. If you need a refill on your pain medication, please contact your pharmacy.  They will contact our office to request authorization.  Prescriptions will not be filled after 5pm or on week-ends. 3. You should follow a light diet the first few days after arrival home, such as soup and crackers, etc.  Resume your normal diet the day after surgery. 4. Most patients will experience some swelling and bruising on the chest and underarm.  Ice packs will help.  Swelling and bruising can take several days to resolve.  5. It is common to experience some constipation if taking pain medication after surgery.  Increasing fluid intake and taking a stool softener (such as Colace) will usually help or prevent this problem from occurring.  A mild laxative (Milk of Magnesia or Miralax) should be taken according to package instructions if there are no bowel movements after 48 hours. 6. Unless discharge instructions indicate otherwise, leave your bandage dry for 48 hours.  You may then shower over the staples, then replace the dressing with dry gauze and tape.   Any  staples will be removed at the office during your follow-up visit. 7. ACTIVITIES:  You may resume regular (light) daily activities beginning the next day--such as daily self-care, walking, climbing stairs--gradually increasing activities as  tolerated.  You may have sexual intercourse when it is comfortable.  Refrain from any heavy lifting or straining until approved by your doctor. a. You may drive when you are no longer taking prescription pain medication, you can comfortably wear a seatbelt, and you can safely maneuver your car and apply brakes. b. RETURN TO WORK:  __________________________________________________________ 8. You should see your doctor in the office for a follow-up appointment approximately 10 days after your surgery.  Your doctors nurse will typically make your follow-up appointment when she calls you with your pathology report.  Expect your pathology report 2-3 business days after your surgery.  You may call to check if you do not hear from [name] after three days.   9. OTHER INSTRUCTIONS: ______________________________________________________________________________________________ ____________________________________________________________________________________________ WHEN TO CALL YOUR DOCTOR: 1. Fever over 101.0 2. Nausea and/or vomiting 3. Extreme swelling or bruising 4. Continued bleeding from incision. 5. Increased pain, redness, or drainage from the incision. The clinic staff is available to answer your questions during regular business hours.  Please dont hesitate to call and ask to speak to one of the nurses for clinical concerns.  If you have a medical emergency, go to the nearest emergency room or call 911.  A surgeon from [facility] is always on call at the hospital. [address] [place], [place], [place]  [address] ? [address], [place], [place]   [address] [phone] ? [phone] ? FAX [phone] Web site: [email or web address]

## 2017-06-22 NOTE — Transfer of Care (Signed)
Immediate Anesthesia Transfer of Care Note  Patient: [name] Riley  Procedure(s) Performed: Procedure(s): EXCISION OF CHEST WALL MASSES (Left)  Patient Location: PACU  Anesthesia Type:General  Level of Consciousness: awake, alert  and oriented  Airway & Oxygen Therapy: Patient Spontanous Breathing and Patient connected to nasal cannula oxygen  Post-op Assessment: Report given to RN and Post -op Vital signs reviewed and stable  Post vital signs: Reviewed and stable  Last Vitals:  Vitals:   06/22/17 0634 06/22/17 0827  BP: (!) 175/88 (P) 125/79  Pulse: 65 (P) 77  Resp: 18 (!) (P) 8  Temp: 36.8 C (!) (P) 36.3 C  SpO2: 100% (P) 98%    Last Pain: There were no vitals filed for this visit.    Patients Stated Pain Goal: 3 (07/24/11 1975)  Complications: No apparent anesthesia complications

## 2017-06-22 NOTE — Anesthesia Postprocedure Evaluation (Signed)
Anesthesia Post Note  Patient: [name] Riley Gin  Procedure(s) Performed: Procedure(s) (LRB): EXCISION OF CHEST WALL MASSES (Left)     Patient location during evaluation: PACU Anesthesia Type: General Level of consciousness: awake and alert Pain management: pain level controlled Vital Signs Assessment: post-procedure vital signs reviewed and stable Respiratory status: spontaneous breathing, nonlabored ventilation, respiratory function stable and patient connected to nasal cannula oxygen Cardiovascular status: blood pressure returned to baseline and stable Postop Assessment: no signs of nausea or vomiting Anesthetic complications: no    Last Vitals:  Vitals:   06/22/17 0905 06/22/17 0915  BP:  134/73  Pulse: 63 64  Resp: 11 12  Temp:    SpO2: 95% 95%    Last Pain:  Vitals:   06/22/17 0903  PainSc: 4                  Akyra Bouchie

## 2017-06-22 NOTE — Interval H&P Note (Signed)
History and Physical Interval Note:  06/22/2017 7:22 AM  [name] Riley  has presented today for surgery, with the diagnosis of METASTATIC BREAST CANCER  The various methods of treatment have been discussed with the patient and family. After consideration of risks, benefits and other options for treatment, the patient has consented to  Procedure(s): EXCISION OF CHEST WALL MASSES (N/A) as a surgical intervention .  The patient's history has been reviewed, patient examined, no change in status, stable for surgery.  I have reviewed the patient's chart and labs.  Questions were answered to the patient's satisfaction.     Tehila Sokolow K.

## 2017-06-22 NOTE — Op Note (Signed)
Preop diagnosis:  Metastatic recurrent breast carcinoma left chest Postop diagnosis: Same Procedure performed: Excision of 3 left chest wall nodules Surgeon:Andrey Mccaskill K. Anesthesia: Gen. via LMA Indications: This is a 66 year old female who is status post left modified radical mastectomy as well as right simple mastectomy in 2013. She had positive lymph nodes and underwent chemotherapy as well as radiation. In 2017 she developed some cutaneous lesions below her mastectomy incision. These were biopsied and revealed recurrent breast carcinoma.  She has resumed chemotherapy. The most lateral chest wall lesion has become larger and has caused a lot of bleeding and tenderness. She has 2 smaller lesions more medially. She presents now for excision.  Description of procedure: Patient brought to the operating room and placed in a supine position on the operating room table. After an adequate level of general anesthesia was obtained, a small ball was placed underneath her left shoulder. Her arm was positioned across her upper chest. We prepped the left chest wall with Betadine and draped sterile fashion. A timeout was taken to ensure the proper patient and proper procedure. We infiltrated the area along the entire length of the incision with 0.25% Marcaine with epinephrine. I outlined an elliptical incisions around each of the 3 lesions. Upon careful examination the most lateral lesion seems to be fairly large measuring almost 3 cm across. All of our elliptical incision seemed to almost be contiguous so I decided to do a single long excision.  I began by excising the most medial lesion. We dissected down into the subcutaneous tissues with cautery. We dissected down the chest wall and excised the entire mass as well as some surrounding normal appearing adipose tissue. We then extended our incision laterally to encompass the 2 lateral lesions. We dissected this off of the chest wall with a rim of grossly appearing  normal tissue. There was some scarring from her previous mastectomy site but I cannot palpate any residual tumor. We irrigated wound thoroughly and inspected for hemostasis. I mobilized the skin and subcutaneous tissue off of the underlying muscle superiorly and inferiorly to aid with closure. We closed with multiple interrupted 3-0 Vicryl sutures. Staples were used to close skin.  A dry dressing was applied. The patient was then extubated and brought to recovery room in stable condition. All sponge, instrument, and needle counts are correct.  Imogene Burn. [name], MD, [facility]  General/ Trauma Surgery  06/22/2017 8:31 AM

## 2017-06-22 NOTE — H&P (View-Only) (Signed)
History of Present Illness [name] Riley. [name] Michalski MD; 06/05/2017 9:12 PM) The patient is a 66 year old female who presents with breast cancer. The patient is a 66 year old female who presents with breast cancer. 67 yo female s/p left modified radical mastectomy and right simple mastectomy 1/13. 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  PAST THERAPY: Bilateral mastectomy (Prophylactic on Right) on 11/14/2011  4 cycles FEC  4 cycles Taxotere  Radiation completed 08/22/2012  CURRENT THERAPY: Arimidex, started 08/22/2012  In 2017, she has developed 4 small cutaneous lesions below her mastectomy incision on the left chest wall. The most lateral lesion near the axilla has formed a small eschar. She was examined by Dr. Lindi Adie who expressed concern that this could be a cutaneous metastases. She was referred for excisional biopsy which confirmed recurrent breast carcinoma. She began chemotherapy again in July 2017. Her port placement was complicated by a small pneumothorax.   Most of the subcutaneous nodules on her left chest wall have resolved, but the most lateral lesion has been bleeding intermittently over the last couple of months. Occasionally the bleeding is fairly severe. She also has one other palpable nodule more medially. No tenderness.   Problem List/Past Medical Rodman Key K. Jairy Angulo, MD; 06/05/2017 9:16 PM) ABSCESS OF ABDOMINAL WALL (L02.211) RECURRENT BREAST CANCER, LEFT (N27.782)  Past Surgical History (Benjamen Koelling K. Sairah Knobloch, MD; 06/05/2017 9:16 PM) Appendectomy Breast Biopsy Bilateral. Colon Polyp Removal - Colonoscopy Hysterectomy (not due to cancer) - Complete Mastectomy Bilateral. Tonsillectomy  Diagnostic Studies History (Taimur Fier K. Adylin Hankey, MD; 06/05/2017 9:16  PM) Colonoscopy 5-10 years ago Mammogram >3 years ago Pap Smear 1-5 years ago  Allergies ([name], RMA; 06/05/2017 11:29 AM) [place] Shortness of breath. No Known Drug Allergies 04/03/2017 Allergies Reconciled  Medication History ([name], [place]; 06/05/2017 11:29 AM) Vitamin D (Ergocalciferol) (50000UNIT Capsule, Oral) Active. Acetaminophen (500MG Tablet, Oral) Active. Aspirin (81MG Tablet, Oral) Active. Lapatinib Ditosylate (250MG Tablet, Oral) Active. Lidocaine-Prilocaine (2.5-2.5% Cream, External) Active. Multivitamins/Minerals (Oral) Active. (Thera-M) Pravachol (80MG Tablet, Oral) Active. Vitamin D (50000U Tablet, Oral) Active. Levothyroxine Sodium (75MCG Tablet, Oral) Active. Metoprolol Succinate ER (100MG Tablet ER 24HR, Oral) Active. Vitamin D (Cholecalciferol) (1000UNIT Tablet, Oral) Active. Medications Reconciled  Social History [name] Riley. Brenon Antosh, MD; 06/05/2017 9:16 PM) Caffeine use Tea. No alcohol use No drug use Tobacco use Never smoker.  Family History [name] Riley. Chevy Sweigert, MD; 06/05/2017 9:16 PM) Heart Disease Family Members In General. Hypertension Brother, Family Members In General, Mother. Prostate Cancer Family Members In General.  Pregnancy / Birth History [name] Riley. Mclane Arora, MD; 06/05/2017 9:16 PM) Age at menarche 63 years. Age of menopause 107-50 Contraceptive History Contraceptive implant. Gravida 4 Maternal age 26-25 Para 3  Other Problems [name] Riley. Deonna Krummel, MD; 06/05/2017 9:16 PM) Anxiety Disorder Breast Cancer High blood pressure Hypercholesterolemia Kidney Stone Myocardial infarction    Vitals ([name]; 06/05/2017 11:28 AM) 06/05/2017 11:28 AM Weight: 157.4 lb Height: 68in Body Surface Area: 1.85 m Body Mass Index: 23.93 kg/m  Temp.: 97.61F  Pulse: 84 (Regular)  BP: 148/84 (Sitting, Left Arm, Standard)      Physical Exam Rodman Key K. Vinisha Faxon MD; 06/05/2017 9:14  PM)  The physical exam findings are as follows: Note:GENERAL:alert, no distress and comfortable SKIN: skin color, texture, turgor are normal, no rashes or significant lesions EYES: normal, Conjunctiva are pink and non-injected, sclera clear OROPHARYNX:no exudate, no erythema and lips,  buccal mucosa, and tongue normal NECK: supple, thyroid normal size, non-tender, without nodularity LYMPH: no palpable lymphadenopathy in the cervical, axillary or inguinal LUNGS: clear to auscultation and percussion with normal breathing effort CHEST: healed mastectomy scars; left side laterally with 2 cm enlarging granulated mass with some more protrusion; no active bleeding; medially, there is a 1 cm palpable subcutaneous nodule that appears to be fixed HEART: regular rate & rhythm and no murmurs and no lower extremity edema ABDOMEN:abdomen soft, non-tender and normal bowel sounds MUSCULOSKELETAL:no cyanosis of digits and no clubbing NEURO: alert & oriented x 3 with fluent speech, no focal motor/sensory deficits EXTREMITIES: No lower extremity edema    Assessment & Plan Rodman Key K. Dakotah Heiman MD; 06/05/2017 9:15 PM)  RECURRENT BREAST CANCER, LEFT (C50.912)  Current Plans Schedule for Surgery - Excision of chest wall nodules - left chest. The surgical procedure has been discussed with the patient. Potential risks, benefits, alternative treatments, and expected outcomes have been explained. All of the patient's questions at this time have been answered. The likelihood of reaching the patient's treatment goal is good. The patient understand the proposed surgical procedure and wishes to proceed. Note:Since the large chest nodule continues to enlarge and bleed, we will excise this under anesthesia. We will also excise the more medial nodule. She understands that this isn't curative, but we are trying to excise these so that the wounds will heal and no longer ooze or bleed.  [name] Riley. [name], MD, [facility]  06/05/2017 9:16 PM

## 2017-06-23 ENCOUNTER — Encounter (HOSPITAL_COMMUNITY): Payer: Self-pay | Admitting: Surgery

## 2017-06-29 ENCOUNTER — Ambulatory Visit: Payer: Self-pay | Admitting: Surgery

## 2017-06-29 ENCOUNTER — Inpatient Hospital Stay (HOSPITAL_COMMUNITY)
Admission: AD | Admit: 2017-06-29 | Discharge: 2017-07-03 | DRG: 857 | Disposition: A | Discharge status: Home Health | Payer: 59 | Source: Ambulatory Visit | Attending: Surgery | Admitting: Surgery

## 2017-06-29 ENCOUNTER — Encounter (HOSPITAL_COMMUNITY): Payer: Self-pay

## 2017-06-29 DIAGNOSIS — Z923 Personal history of irradiation: Secondary | ICD-10-CM

## 2017-06-29 DIAGNOSIS — R7989 Other specified abnormal findings of blood chemistry: Secondary | ICD-10-CM

## 2017-06-29 DIAGNOSIS — K719 Toxic liver disease, unspecified: Secondary | ICD-10-CM | POA: Diagnosis present

## 2017-06-29 DIAGNOSIS — Z9013 Acquired absence of bilateral breasts and nipples: Secondary | ICD-10-CM

## 2017-06-29 DIAGNOSIS — I10 Essential (primary) hypertension: Secondary | ICD-10-CM | POA: Diagnosis present

## 2017-06-29 DIAGNOSIS — Z9221 Personal history of antineoplastic chemotherapy: Secondary | ICD-10-CM

## 2017-06-29 DIAGNOSIS — Z79899 Other long term (current) drug therapy: Secondary | ICD-10-CM

## 2017-06-29 DIAGNOSIS — C50919 Malignant neoplasm of unspecified site of unspecified female breast: Secondary | ICD-10-CM | POA: Insufficient documentation

## 2017-06-29 DIAGNOSIS — T814XXA Infection following a procedure, initial encounter: Principal | ICD-10-CM | POA: Diagnosis present

## 2017-06-29 DIAGNOSIS — E039 Hypothyroidism, unspecified: Secondary | ICD-10-CM | POA: Diagnosis present

## 2017-06-29 DIAGNOSIS — Z90710 Acquired absence of both cervix and uterus: Secondary | ICD-10-CM

## 2017-06-29 DIAGNOSIS — L03313 Cellulitis of chest wall: Secondary | ICD-10-CM | POA: Diagnosis present

## 2017-06-29 DIAGNOSIS — Z7982 Long term (current) use of aspirin: Secondary | ICD-10-CM

## 2017-06-29 DIAGNOSIS — C50912 Malignant neoplasm of unspecified site of left female breast: Secondary | ICD-10-CM | POA: Diagnosis present

## 2017-06-29 DIAGNOSIS — R945 Abnormal results of liver function studies: Secondary | ICD-10-CM

## 2017-06-29 LAB — COMPREHENSIVE METABOLIC PANEL
ALT: 120 U/L — ABNORMAL HIGH (ref 14–54)
AST: 204 U/L — ABNORMAL HIGH (ref 15–41)
Albumin: 3.5 g/dL (ref 3.5–5.0)
Alkaline Phosphatase: 327 U/L — ABNORMAL HIGH (ref 38–126)
Anion gap: 11 (ref 5–15)
BUN: 20 mg/dL (ref 6–20)
CO2: 26 mmol/L (ref 22–32)
Calcium: 9.3 mg/dL (ref 8.9–10.3)
Chloride: 98 mmol/L — ABNORMAL LOW (ref 101–111)
Creatinine, Ser: 0.68 mg/dL (ref 0.44–1.00)
GFR calc Af Amer: >60 mL/min (ref >60)
GFR calc non Af Amer: >60 mL/min (ref >60)
Glucose, Bld: 101 mg/dL — ABNORMAL HIGH (ref 65–99)
Potassium: 3.6 mmol/L (ref 3.5–5.1)
Sodium: 135 mmol/L (ref 135–145)
Total Bilirubin: 0.9 mg/dL (ref 0.3–1.2)
Total Protein: 7.1 g/dL (ref 6.5–8.1)

## 2017-06-29 LAB — CBC
HCT: 33.4 % — ABNORMAL LOW (ref 36.0–46.0)
Hemoglobin: 11 g/dL — ABNORMAL LOW (ref 12.0–15.0)
MCH: 29.8 pg (ref 26.0–34.0)
MCHC: 32.9 g/dL (ref 30.0–36.0)
MCV: 90.5 fL (ref 78.0–100.0)
Platelets: 264 10*3/uL (ref 150–400)
RBC: 3.69 MIL/uL — ABNORMAL LOW (ref 3.87–5.11)
RDW: 13.3 % (ref 11.5–15.5)
WBC: 10.5 10*3/uL (ref 4.0–10.5)

## 2017-06-29 LAB — MRSA PCR SCREENING: MRSA by PCR: POSITIVE — AB

## 2017-06-29 MED ORDER — DIPHENHYDRAMINE HCL 12.5 MG/5ML PO ELIX: 12.5000 mg | ORAL_SOLUTION | Freq: Four times a day (QID) | ORAL | Status: DC | PRN

## 2017-06-29 MED ORDER — IBUPROFEN 200 MG PO TABS
400.0000 mg | ORAL_TABLET | Freq: Four times a day (QID) | ORAL | Status: DC | PRN
  Filled 2017-06-29: qty 2

## 2017-06-29 MED ORDER — PROCHLORPERAZINE MALEATE 5 MG PO TABS: 10.0000 mg | ORAL_TABLET | Freq: Four times a day (QID) | ORAL | Status: DC | PRN

## 2017-06-29 MED ORDER — ONDANSETRON 4 MG PO TBDP
4.0000 mg | ORAL_TABLET | Freq: Four times a day (QID) | ORAL | Status: DC | PRN
  Administered 2017-06-29: 4 mg via ORAL
  Filled 2017-06-29: qty 1

## 2017-06-29 MED ORDER — ENSURE ENLIVE PO LIQD
237.0000 mL | Freq: Two times a day (BID) | ORAL | Status: DC
  Administered 2017-06-29 – 2017-07-03 (×6): 237 mL via ORAL

## 2017-06-29 MED ORDER — ACETAMINOPHEN 325 MG PO TABS: 1000.0000 mg | ORAL_TABLET | Freq: Four times a day (QID) | ORAL | Status: DC

## 2017-06-29 MED ORDER — MORPHINE SULFATE (PF) 4 MG/ML IV SOLN: 2.0000 mg | INTRAVENOUS | Status: DC | PRN

## 2017-06-29 MED ORDER — OXYCODONE HCL 5 MG PO TABS: 5.0000 mg | ORAL_TABLET | ORAL | Status: DC | PRN

## 2017-06-29 MED ORDER — ONDANSETRON 4 MG PO TBDP: 4.0000 mg | ORAL_TABLET | Freq: Four times a day (QID) | ORAL | Status: DC | PRN

## 2017-06-29 MED ORDER — SODIUM CHLORIDE 0.9 % IV SOLN: 4.0000 mg | Freq: Four times a day (QID) | INTRAVENOUS | Status: DC | PRN

## 2017-06-29 MED ORDER — PROCHLORPERAZINE EDISYLATE 5 MG/ML IJ SOLN: 5.0000 mg | Freq: Four times a day (QID) | INTRAMUSCULAR | Status: DC | PRN

## 2017-06-29 MED ORDER — LEVOTHYROXINE SODIUM 75 MCG PO TABS
75.0000 ug | ORAL_TABLET | Freq: Every day | ORAL | Status: DC
  Administered 2017-06-30 – 2017-07-03 (×4): 75 ug via ORAL
  Filled 2017-06-29 (×4): qty 1

## 2017-06-29 MED ORDER — SODIUM CHLORIDE 0.9 % IV SOLN: INTRAVENOUS | Status: DC

## 2017-06-29 MED ORDER — PIPERACILLIN SOD-TAZOBACTAM SO 2.25 (2-0.25) G IV SOLR: 3.3750 g | Freq: Three times a day (TID) | INTRAVENOUS | Status: DC

## 2017-06-29 MED ORDER — ENOXAPARIN SODIUM 150 MG/ML ~~LOC~~ SOLN: 40.0000 mg | SUBCUTANEOUS | Status: DC

## 2017-06-29 MED ORDER — DIPHENHYDRAMINE HCL 50 MG/ML IJ SOLN: 12.5000 mg | Freq: Four times a day (QID) | INTRAMUSCULAR | Status: DC | PRN

## 2017-06-29 MED ORDER — OXYCODONE HCL 5 MG PO TABS
5.0000 mg | ORAL_TABLET | ORAL | Status: DC | PRN
  Administered 2017-06-29 – 2017-07-01 (×2): 5 mg via ORAL
  Administered 2017-07-02 – 2017-07-03 (×3): 10 mg via ORAL
  Filled 2017-06-29: qty 2
  Filled 2017-06-29 (×2): qty 1
  Filled 2017-06-29 (×2): qty 2

## 2017-06-29 MED ORDER — DEXTROSE 5 % IV SOLN
1.0000 g | INTRAVENOUS | Status: DC
  Administered 2017-06-29 – 2017-07-02 (×4): 1 g via INTRAVENOUS
  Filled 2017-06-29 (×4): qty 10

## 2017-06-29 MED ORDER — ONDANSETRON HCL 4 MG/2ML IJ SOLN: 4.0000 mg | Freq: Four times a day (QID) | INTRAMUSCULAR | Status: DC | PRN

## 2017-06-29 MED ORDER — METOPROLOL SUCCINATE ER 50 MG PO TB24
100.0000 mg | ORAL_TABLET | Freq: Every day | ORAL | Status: DC
  Administered 2017-06-30 – 2017-07-03 (×4): 100 mg via ORAL
  Filled 2017-06-29 (×4): qty 2

## 2017-06-29 MED ORDER — PIPERACILLIN-TAZOBACTAM 3.375 G IVPB
3.3750 g | Freq: Three times a day (TID) | INTRAVENOUS | Status: DC
  Administered 2017-06-29: 3.375 g via INTRAVENOUS
  Filled 2017-06-29: qty 50

## 2017-06-29 MED ORDER — ACETAMINOPHEN 500 MG PO TABS
1000.0000 mg | ORAL_TABLET | Freq: Four times a day (QID) | ORAL | Status: DC
  Administered 2017-06-29: 1000 mg via ORAL
  Filled 2017-06-29: qty 2

## 2017-06-29 MED ORDER — LAPATINIB DITOSYLATE 250 MG PO TABS: 1250.0000 mg | ORAL_TABLET | Freq: Every day | ORAL | Status: DC

## 2017-06-29 MED ORDER — SODIUM CHLORIDE 0.9 % IV SOLN
INTRAVENOUS | Status: DC
  Administered 2017-06-29: 12:00:00 via INTRAVENOUS
  Administered 2017-06-30: 1000 mL via INTRAVENOUS
  Administered 2017-06-30 – 2017-07-01 (×2): via INTRAVENOUS
  Administered 2017-07-01: 1000 mL via INTRAVENOUS
  Administered 2017-07-02 – 2017-07-03 (×2): via INTRAVENOUS

## 2017-06-29 MED ORDER — PROCHLORPERAZINE MALEATE 10 MG PO TABS
10.0000 mg | ORAL_TABLET | Freq: Four times a day (QID) | ORAL | Status: DC | PRN
  Administered 2017-06-29: 10 mg via ORAL
  Filled 2017-06-29: qty 1

## 2017-06-29 MED ORDER — ENOXAPARIN SODIUM 40 MG/0.4ML ~~LOC~~ SOLN
40.0000 mg | SUBCUTANEOUS | Status: DC
  Administered 2017-06-30 – 2017-07-03 (×3): 40 mg via SUBCUTANEOUS
  Filled 2017-06-29 (×3): qty 0.4

## 2017-06-29 MED ORDER — MORPHINE SULFATE (PF) 2 MG/ML IV SOLN
2.0000 mg | INTRAVENOUS | Status: DC | PRN
  Administered 2017-06-30 – 2017-07-01 (×2): 2 mg via INTRAVENOUS
  Filled 2017-06-29 (×2): qty 1

## 2017-06-29 NOTE — Progress Notes (Signed)
Pharmacy Note    When any of the following medications are ordered by a non-oncologist:  1. A pharmacist will review the inpatient medical record for:  a. Abnormal Lab Values:  CBC  CrCl  LFTs  b. Fever (temperature ? 100.5 F) or other signs of infection  c. Other pertinent data  2. If fever, other signs of infection, or any of the medication-specific conditions in the checklist below  are identified, the medication will be held until physician review occurs as described in the oral  chemotherapy policy.  3. If patient meets criteria to proceed after checklist screening, before dispensing the medication the  pharmacist will also:  a. Review for potential drug interactions  b. Review need for dosage adjustment for organ dysfunction   Drug specific hold criteria include   Lapatinib (Tykerb)  AST or ALT > 3x ULN  Bili > 1.5x ULN  Diarrhea - Grade 2 or higher*  New or worsened CHF  Unexplained pneumonitis / hypoxemia   Due to current infection will hold lapatinib at the current time per P and T committee criteria.  Royetta Asal, PharmD, BCPS Pager [phone] 06/29/2017 10:27 AM

## 2017-06-29 NOTE — Progress Notes (Addendum)
Patients temperature increased.  IV Zosyn stopped.  Patient states no changes in warmth or redness in face.  Paged Dr. Hassell Done.

## 2017-06-29 NOTE — Progress Notes (Signed)
Called in to patients room by son stating that her face was red and feeling hot.  Patient sitting up in bed with no signs of distress.  Patients cheeks are flushed.  Patient stated no SOB, no chest pain, no itching.  Only place feeling warm is her face.  Zosyn running through IV currently.  Patient stated she sometimes gets flushed at home for about 30 minutes and then it goes away. Stated that we could turn off IV antibiotics at this time or, if she felt comfortable, leave it running.  She stated to leave it running for now and if the warmth or flushing got worse she would inform staff.

## 2017-06-29 NOTE — H&P (Signed)
[name] Riley is an 66 y.o. female.   Chief Complaint: chest wall wound infection HPI: The patient is a 66 year old female who presents with breast cancer. The patient is a 66 year old female who presents with breast cancer. 66 yo female s/p left modified radical mastectomy and right simple mastectomy 1/13. 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  PAST THERAPY: Bilateral mastectomy (Prophylactic on Right) on 11/14/2011  4 cycles FEC  4 cycles Taxotere  Radiation completed 08/22/2012  CURRENT THERAPY: Arimidex, started 08/22/2012  In 2017, she has developed 4 small cutaneous lesions below her mastectomy incision on the left chest wall. The most lateral lesion near the axilla has formed a small eschar. She was examined by Dr. Lindi Riley who expressed concern that this could be a cutaneous metastases. She was referred for excisional biopsy which confirmed recurrent breast carcinoma. She began chemotherapy again in July 2017. Her port placement was complicated by a small pneumothorax.  Most of the subcutaneous nodules on her left chest wall have resolved, but the most lateral lesion has been bleeding intermittently over the last couple of months. Occasionally the bleeding is fairly severe. She also has one other palpable nodule more medially. No tenderness.  On 06/22/17, she underwent excision of these chest wall nodules.  The wound was closed with multiple interrupted subcutaneous sutures and staples were used to close the skin. Over the last several days, the skin around her incision has become erythematous. Yesterday began draining some purulent fluid. She was seen in the office earlier this morning and was noted to have a wound infection. I removed most staples and expressed some purulent  fluid. She is being admitted for intravenous antibiotics. She is also fairly nauseated and has been unable to take much by mouth.  Past Medical History:  Diagnosis Date  . Allergy   . Breast cancer ([place]) 08/25/2011   L , invasive ductal carcinoma, ER/PR +,HER2 -  . Cancer [facility])    left breast cancer  . Coronary artery disease 2001  . Heart attack [facility]) 09/2000   Sep 25, 2000  --no intervention  . History of chemotherapy comp. 08/22/2012   4 cycles of FEC and $ cycles of Taxotere  . Hyperlipidemia   . Hypertension   . Hypothyroidism   . PONV (postoperative nausea and vomiting)    gets sick from anesthesia  . Status post radiation therapy 07/09/12 - 08/22/2012   Left Breast, 60.4 gray    Past Surgical History:  Procedure Laterality Date  . ABDOMINAL HYSTERECTOMY  1998   TAH, oophorectomy  . APPENDECTOMY  1970  . BREAST SURGERY  1998   removal of benign lump in rt breast  . BREAST SURGERY  11/14/11   right simple mastectomy, left mrm  . MASS EXCISION Left 06/22/2017   Procedure: EXCISION OF CHEST WALL MASSES;  Surgeon: [name] Mesa, MD;  Location: [place];  Service: General;  Laterality: Left;  Marland Kitchen MASTECTOMY Bilateral    for left breast cancer  . OVARIAN CYST SURGERY Right 1970  . PORT-A-CATH REMOVAL  08/30/2012   Procedure: REMOVAL PORT-A-CATH;  Surgeon: [name] Burn. [name], MD;  Location: [place];  Service: General;  Laterality: Right;  port removal  . PORTACATH PLACEMENT  11/14/2011   Procedure: INSERTION PORT-A-CATH;  Surgeon: [name] Burn. [name], MD;  Location: [place];  Service: General;  Laterality: Right;  .  PORTACATH PLACEMENT N/A 04/25/2016   Procedure: INSERTION PORT-A-CATH LEFT CHEST;  Surgeon: [name] Mesa, MD;  Location: [place];  Service: General;  Laterality: N/A;  . skin tags  05/09/1997   left axillary left neck skin tags  . TONSILLECTOMY  1968    Family History  Problem Relation Age of Onset  . Hypertension Maternal Grandmother   .  Diabetes Maternal Grandmother   . Cancer Father 60       lung cancer and Prostate Cancer  . Hypertension Mother   . Cancer Paternal Aunt        ovarian  . Cancer Cousin        breast, paternal cousin  . Cancer Paternal Uncle        stomach  . Cancer Paternal Grandfather        Esophagus  . Colon cancer Neg Hx    Social History:  reports that she has never smoked. She has never used smokeless tobacco. She reports that she does not drink alcohol or use drugs.  Allergies:  Allergies  Allergen Reactions  . Contrast Media [Iodinated Diagnostic Agents] Shortness Of Breath and Rash  . Food Shortness Of Breath    Cantaloupe  . Pravastatin Other (See Comments)    Legs hurt    Prior to Admission medications   Medication Sig Start Date End Date Taking? Authorizing Provider  acetaminophen (TYLENOL) 500 MG tablet Take 1,000 mg by mouth every 6 (six) hours as needed for moderate pain.    [provider]  aspirin 81 MG tablet Take 81 mg by mouth daily.      [provider]  cephALEXin (KEFLEX) 500 MG capsule Take 1 capsule (500 mg total) by mouth 4 (four) times daily. 06/13/17   [name] Lose, MD  ergocalciferol (VITAMIN D2) 50000 units capsule Take 50,000 Units by mouth once a week. Monday    [provider]  HYDROcodone-acetaminophen (NORCO/VICODIN) 5-325 MG tablet Take 1 tablet by mouth every 4 (four) hours as needed for moderate pain. 06/22/17   [name] Mesa, MD  lapatinib (TYKERB) 250 MG tablet TAKE 5 TABLETS (1250MG TOTAL) BY MOUTH DAILY 05/29/17   [name] Lose, MD  levothyroxine (SYNTHROID, LEVOTHROID) 75 MCG tablet Take 75 mcg by mouth daily.     [provider]  lidocaine-prilocaine (EMLA) cream Apply to affected area once 03/29/17   [name] Lose, MD  metoprolol succinate (TOPROL-XL) 100 MG 24 hr tablet Take 1 tablet (100 mg total) by mouth daily. 08/19/16   [name] Lose, MD  Multiple Vitamins-Minerals (MULTIVITAMIN WITH MINERALS) tablet Take 1 tablet  by mouth daily.      [provider]  predniSONE (DELTASONE) 50 MG tablet Take 1 tablet 13, 7, and 1 hour prior to CT scan. 06/13/17   [name] Lose, MD  Vitamin D, Ergocalciferol, (DRISDOL) 50000 units CAPS capsule Take 50,000 Units by mouth once a week. Monday 12/29/16   [provider]     Review of Systems  Constitutional: Positive for fever and malaise/fatigue. Negative for weight loss.  HENT: Negative for ear discharge, ear pain, hearing loss and tinnitus.   Eyes: Negative for blurred vision, double vision, photophobia and pain.  Respiratory: Negative for cough, sputum production and shortness of breath.   Cardiovascular: Negative for chest pain.  Gastrointestinal: Positive for nausea. Negative for abdominal pain and vomiting.  Genitourinary: Negative for dysuria, flank pain, frequency and urgency.  Musculoskeletal: Positive for myalgias. Negative for back pain, falls, joint pain and neck pain.  Neurological: Positive for weakness. Negative for dizziness, tingling, sensory change, focal weakness, loss of consciousness and headaches.  Endo/Heme/Allergies: Does not bruise/bleed easily.  Psychiatric/Behavioral: Negative for depression, memory loss and substance abuse. The patient is not nervous/anxious.     There were no vitals taken for this visit. Physical Exam  GENERAL:alert, no distress and comfortable SKIN: skin color, texture, turgor are normal, no rashes or significant lesions EYES: normal, Conjunctiva are pink and non-injected, sclera clear OROPHARYNX:no exudate, no erythema and lips, buccal mucosa, and tongue normal NECK: supple, thyroid normal size, non-tender, without nodularity LYMPH: no palpable lymphadenopathy in the cervical, axillary or inguinal LUNGS: clear to auscultation and percussion with normal breathing effort CHEST: healed mastectomy scars; left side staple line with significant surrounding erythema and purulent drainage;  Staples removed and  some purulent fluid expressed HEART: regular rate & rhythm and no murmurs and no lower extremity edema ABDOMEN:abdomen soft, non-tender and normal bowel sounds MUSCULOSKELETAL:no cyanosis of digits and no clubbing NEURO: alert & oriented x 3 with fluent speech, no focal motor/sensory deficits EXTREMITIES: No lower extremity edema  Assessment/Plan Wound infection after resection of chest wall malignant nodules Immunosuppressed from ongoing chemotherapy Metastatic breast cancer  Admit for IV hydration, antibiotics Dressing changes    Trevante Tennell K., MD 06/29/2017, 9:29 AM

## 2017-06-30 DIAGNOSIS — R7989 Other specified abnormal findings of blood chemistry: Secondary | ICD-10-CM | POA: Diagnosis not present

## 2017-06-30 DIAGNOSIS — C50919 Malignant neoplasm of unspecified site of unspecified female breast: Secondary | ICD-10-CM | POA: Diagnosis not present

## 2017-06-30 LAB — PROTIME-INR
INR: 1
Prothrombin Time: 13.1 seconds (ref 11.4–15.2)

## 2017-06-30 LAB — CBC
HCT: 31.6 % — ABNORMAL LOW (ref 36.0–46.0)
Hemoglobin: 10.6 g/dL — ABNORMAL LOW (ref 12.0–15.0)
MCH: 30.1 pg (ref 26.0–34.0)
MCHC: 33.5 g/dL (ref 30.0–36.0)
MCV: 89.8 fL (ref 78.0–100.0)
Platelets: 279 10*3/uL (ref 150–400)
RBC: 3.52 MIL/uL — ABNORMAL LOW (ref 3.87–5.11)
RDW: 13.3 % (ref 11.5–15.5)
WBC: 7.9 10*3/uL (ref 4.0–10.5)

## 2017-06-30 LAB — COMPREHENSIVE METABOLIC PANEL
ALT: 226 U/L — ABNORMAL HIGH (ref 14–54)
AST: 312 U/L — ABNORMAL HIGH (ref 15–41)
Albumin: 2.8 g/dL — ABNORMAL LOW (ref 3.5–5.0)
Alkaline Phosphatase: 372 U/L — ABNORMAL HIGH (ref 38–126)
Anion gap: 7 (ref 5–15)
BUN: 15 mg/dL (ref 6–20)
CO2: 28 mmol/L (ref 22–32)
Calcium: 8.6 mg/dL — ABNORMAL LOW (ref 8.9–10.3)
Chloride: 101 mmol/L (ref 101–111)
Creatinine, Ser: 0.53 mg/dL (ref 0.44–1.00)
GFR calc Af Amer: >60 mL/min (ref >60)
GFR calc non Af Amer: >60 mL/min (ref >60)
Glucose, Bld: 101 mg/dL — ABNORMAL HIGH (ref 65–99)
Potassium: 3.3 mmol/L — ABNORMAL LOW (ref 3.5–5.1)
Sodium: 136 mmol/L (ref 135–145)
Total Bilirubin: 0.7 mg/dL (ref 0.3–1.2)
Total Protein: 6.2 g/dL — ABNORMAL LOW (ref 6.5–8.1)

## 2017-06-30 LAB — LIPASE, BLOOD: Lipase: 16 U/L (ref 11–51)

## 2017-06-30 LAB — HIV ANTIBODY (ROUTINE TESTING W REFLEX): HIV Screen 4th Generation wRfx: NONREACTIVE

## 2017-06-30 MED ORDER — MUPIROCIN 2 % EX OINT
1.0000 "application " | TOPICAL_OINTMENT | Freq: Two times a day (BID) | CUTANEOUS | Status: DC
  Administered 2017-06-30 – 2017-07-03 (×6): 1 via NASAL
  Filled 2017-06-30: qty 22

## 2017-06-30 MED ORDER — ACETAMINOPHEN 325 MG PO TABS
650.0000 mg | ORAL_TABLET | Freq: Four times a day (QID) | ORAL | Status: DC | PRN
  Administered 2017-06-30: 650 mg via ORAL
  Filled 2017-06-30: qty 2

## 2017-06-30 MED ORDER — CHLORHEXIDINE GLUCONATE CLOTH 2 % EX PADS
6.0000 | MEDICATED_PAD | Freq: Every day | CUTANEOUS | Status: DC
  Administered 2017-06-30 – 2017-07-02 (×3): 6 via TOPICAL

## 2017-06-30 MED ORDER — VANCOMYCIN HCL IN DEXTROSE 1-5 GM/200ML-% IV SOLN
1000.0000 mg | Freq: Two times a day (BID) | INTRAVENOUS | Status: DC
  Administered 2017-06-30 – 2017-07-03 (×8): 1000 mg via INTRAVENOUS
  Filled 2017-06-30 (×7): qty 200

## 2017-06-30 NOTE — Progress Notes (Signed)
Per Dr Hassell Done, it is preferable that [name] is done tonight so "please keep her on the list for tonight".  Patient notified and to remain NPO. Donne Hazel, RN

## 2017-06-30 NOTE — Progress Notes (Addendum)
Spouse came out to talk to RN and stated "she is going to eat tonight. We can do the ultrasound later. I am not going to let her go into surgery tomorrow without eating today. I am in charge here, not the doctor. She's going to eat." Donne Hazel, RN [facility] paged to notify her. Donne Hazel, RN

## 2017-06-30 NOTE — Progress Notes (Addendum)
   Subjective/Chief Complaint: Patient feels better, much less nauseated Still with some left chest tenderness Significant drainage Low-grade fever   Objective: Vital signs in last 24 hours: Temp:  [98.2 F (36.8 C)-100.3 F (37.9 C)] 100.3 F (37.9 C) (09/14 0535) Pulse Rate:  [85-90] 90 (09/14 0535) Resp:  [16-18] 16 (09/14 0535) BP: (111-135)/(67-77) 125/74 (09/14 0535) SpO2:  [95 %-98 %] 95 % (09/14 0535) Weight:  [70.3 kg (155 lb)] 70.3 kg (155 lb) (09/13 1931) Last BM Date: 06/28/17  Intake/Output from previous day: 09/13 0701 - 09/14 0700 In: 2805 [P.O.:950; I.V.:1755; IV Piggyback:100] Out: 900 [Urine:900] Intake/Output this shift: No intake/output data recorded.  General appearance: alert, cooperative and no distress Chest wall: left sided chest wall tenderness, erythema across left chest - decreased from yesterday Wound - thin purulent drainage; opened up the rest of the wound  Lab Results:   Recent Labs  06/29/17 0959 06/30/17 0002  WBC 10.5 7.9  HGB 11.0* 10.6*  HCT 33.4* 31.6*  PLT 264 279   BMET  Recent Labs  06/29/17 0959 06/30/17 0002  NA 135 136  K 3.6 3.3*  CL 98* 101  CO2 26 28  GLUCOSE 101* 101*  BUN 20 15  CREATININE 0.68 0.53  CALCIUM 9.3 8.6*   Hepatic Function Latest Ref Rng & Units 06/30/2017 06/29/2017 05/24/2017  Total Protein 6.5 - 8.1 g/dL 6.2(L) 7.1 7.7  Albumin 3.5 - 5.0 g/dL 2.8(L) 3.5 4.2  AST 15 - 41 U/L 312(H) 204(H) 33  ALT 14 - 54 U/L 226(H) 120(H) 27  Alk Phosphatase 38 - 126 U/L 372(H) 327(H) 128  Total Bilirubin 0.3 - 1.2 mg/dL 0.7 0.9 0.58    PT/INR No results for input(s): LABPROT, INR in the last 72 hours. ABG No results for input(s): PHART, HCO3 in the last 72 hours.  Invalid input(s): PCO2, PO2  Studies/Results: No results found.  Anti-infectives: Anti-infectives    Start     Dose/Rate Route Frequency Ordered Stop   06/29/17 1700  cefTRIAXone (ROCEPHIN) 1 g in dextrose 5 % 50 mL IVPB     1  g 100 mL/hr over 30 Minutes Intravenous Every 24 hours 06/29/17 1622     06/29/17 1200  piperacillin-tazobactam (ZOSYN) IVPB 3.375 g  Status:  Discontinued    Comments:  Pharmacy to check dosing   3.375 g 12.5 mL/hr over 240 Minutes Intravenous Every 8 hours 06/29/17 0939 06/29/17 1628      Assessment/Plan: Recurrent left breast cancer - previously radiated; on Herceptin and Tykerb Significant wound infection after excsion of chest wall metastases on 06/22/17. Increased liver function tests - ?possibly related to chemo drugs; Dr. Lindi Adie has recommended GI consult. Tykerb on hold for now. Wound cultured - on Rocephin, will add VANC since MRSA swab positive and culture pending. If wound does not improve, will plan to go to OR tomorrow to washout wound under anesthesia.    LOS: 0 days    [name] Riley K. 06/30/2017

## 2017-06-30 NOTE — Progress Notes (Signed)
Spoke with ultrasound staff who report they will most likely not be able to do ultrasound this evening. Call made to Dr Tsuei's office and spoke with April who will ask PA-C to call me about letting patient eat and being NPO after MN. Also asked about patient getting Tylenol for a headache vs. Advil per patient request.  Donne Hazel, RN

## 2017-06-30 NOTE — Progress Notes (Signed)
Initial Nutrition Assessment  DOCUMENTATION CODES:   Not applicable  INTERVENTION:   Ensure Enlive po BID, each supplement provides 350 kcal and 20 grams of protein  NUTRITION DIAGNOSIS:   Increased nutrient needs related to wound healing as evidenced by estimated needs.  GOAL:   Patient will meet greater than or equal to 90% of their needs  MONITOR:   PO intake, Supplement acceptance, Labs, Weight trends  REASON FOR ASSESSMENT:   Malnutrition Screening Tool    ASSESSMENT:   Pt with PMH of left breast cancer with four cycles of FEC/Taxotere, bilateral mastectomy 1/13, CAD, HLD, and HTN. Presents this admission with wound infection after resection of chest wall malignant nodules.  Spoke with pt at bedside, who reports having loss in appetite since May 2018. States appetite started to decrease after taking PO chemotherapy medications. Taking them 5 times per day made her nauseous. Pt drinks Ensure's off and on at home. With her on/off appetite and new wound status, discussed the importance of protein intake for preservation of lean body mass. Included protein supplement information in discharge instructions.   Records indicate pt has maintained her wt of 155-160 lb since November 2017. Nutrition-Focused physical exam completed. Findings are no fat depletion, no muscle depletion, and no edema. RD to provide supplementation this hospital stay for increased needs.   Medications reviewed and include: NS @ 100 ml/hr, IV abx Labs reviewed: K 3.3 (L) AST 312 (H) ALT 226 (H) Albumin 2.8 (L)   Diet Order:  Diet regular Room service appropriate? Yes; Fluid consistency: Thin Diet NPO time specified  Skin:   (left chest wound)  Last BM:  06/28/17  Height:   Ht Readings from Last 1 Encounters:  06/29/17 5\' 8"  (1.727 m)    Weight:   Wt Readings from Last 1 Encounters:  06/29/17 155 lb (70.3 kg)    Ideal Body Weight:  63.6 kg  BMI:  Body mass index is 23.57  kg/m.  Estimated Nutritional Needs:   Kcal:  1900-2100 (27-30 kcal/kg)  Protein:  105-115 grams (1.5-1.6 g/kg)  Fluid:  >1.9 L/day  EDUCATION NEEDS:   Education needs addressed  [place], LDN Clinical Nutrition Pager # [phone]

## 2017-06-30 NOTE — Consult Note (Signed)
Consultation  Referring Provider: Dr. [name]      Primary Care Physician:  [name], MD Primary Gastroenterologist: Dr. Fuller Plan        Reason for Consultation: Elevated LFT's             HPI:   [name] Riley is a 66 y.o. female with past medical history of metastatic breast cancer s/p mastectomy and on chemotherapy with Tykerb and Herceptin as well as monthly Fasludex, who presented to the hospital with surgery service after excisional biopsy of newly formed cutaneous lesions concerning for cutaneous metastases on 06/22/17 and new erythema around this area concerning for wound infection within the past few days. We were consulted in regards to increasing LFT's since admission.   Today, the patient is found sitting up her chair and confirms the chemotherapy regimen as above. This was all started in July of last year. Recent medications include Kelflex given for skin lesions at the beginning of this month as well as 2 doses of Valtrex that she was given around the same time. Patient does describe chronic loose stools ever since starting chemotherapy and related abdominal cramping. She denies previous known history of elevated LFT's. History of drug use, blood transfusions or family history of liver disease.    Patient denies fever, chills, heartburn or reflux.  Previous Gi History: None  Past Medical History:  Diagnosis Date  . Allergy   . Breast cancer ([place]) 08/25/2011   L , invasive ductal carcinoma, ER/PR +,HER2 -  . Cancer [facility])    left breast cancer  . Coronary artery disease 2001  . Heart attack [facility]) 09/2000   Sep 25, 2000  --no intervention  . History of chemotherapy comp. 08/22/2012   4 cycles of FEC and $ cycles of Taxotere  . Hyperlipidemia   . Hypertension   . Hypothyroidism   . PONV (postoperative nausea and vomiting)    gets sick from anesthesia  . Status post radiation therapy 07/09/12 - 08/22/2012   Left Breast, 60.4 gray    Past Surgical History:  Procedure  Laterality Date  . ABDOMINAL HYSTERECTOMY  1998   TAH, oophorectomy  . APPENDECTOMY  1970  . BREAST SURGERY  1998   removal of benign lump in rt breast  . BREAST SURGERY  11/14/11   right simple mastectomy, left mrm  . MASS EXCISION Left 06/22/2017   Procedure: EXCISION OF CHEST WALL MASSES;  Surgeon: Donnie Mesa, MD;  Location: [place];  Service: General;  Laterality: Left;  Marland Kitchen MASTECTOMY Bilateral    for left breast cancer  . OVARIAN CYST SURGERY Right 1970  . PORT-A-CATH REMOVAL  08/30/2012   Procedure: REMOVAL PORT-A-CATH;  Surgeon: Imogene Burn. [name], MD;  Location: [place];  Service: General;  Laterality: Right;  port removal  . PORTACATH PLACEMENT  11/14/2011   Procedure: INSERTION PORT-A-CATH;  Surgeon: Imogene Burn. [name], MD;  Location: [place];  Service: General;  Laterality: Right;  . PORTACATH PLACEMENT N/A 04/25/2016   Procedure: INSERTION PORT-A-CATH LEFT CHEST;  Surgeon: Donnie Mesa, MD;  Location: [place];  Service: General;  Laterality: N/A;  . skin tags  05/09/1997   left axillary left neck skin tags  . TONSILLECTOMY  1968    Family History  Problem Relation Age of Onset  . Hypertension Maternal Grandmother   . Diabetes Maternal Grandmother   . Cancer Father 90       lung cancer and Prostate Cancer  .  Hypertension Mother   . Cancer Paternal Aunt        ovarian  . Cancer Cousin        breast, paternal cousin  . Cancer Paternal Uncle        stomach  . Cancer Paternal Grandfather        Esophagus  . Colon cancer Neg Hx      Social History  Substance Use Topics  . Smoking status: Never Smoker  . Smokeless tobacco: Never Used  . Alcohol use No    Prior to Admission medications   Medication Sig Start Date End Date Taking? Authorizing Provider  acetaminophen (TYLENOL) 500 MG tablet Take 1,000 mg by mouth every 6 (six) hours as needed for moderate pain.   Yes [provider]  aspirin 81 MG tablet Take 81 mg by mouth  daily.     Yes [provider]  ergocalciferol (VITAMIN D2) 50000 units capsule Take 50,000 Units by mouth every Monday.    Yes [provider]  HYDROcodone-acetaminophen (NORCO/VICODIN) 5-325 MG tablet Take 1 tablet by mouth every 4 (four) hours as needed for moderate pain. 06/22/17  Yes Donnie Mesa, MD  lapatinib (TYKERB) 250 MG tablet TAKE 5 TABLETS (1250MG TOTAL) BY MOUTH DAILY 05/29/17  Yes Nicholas Lose, MD  levothyroxine (SYNTHROID, LEVOTHROID) 75 MCG tablet Take 75 mcg by mouth daily.    Yes [provider]  lidocaine-prilocaine (EMLA) cream Apply to affected area once Patient taking differently: as needed (for port access). Apply to affected area once 03/29/17  Yes Nicholas Lose, MD  metoprolol succinate (TOPROL-XL) 100 MG 24 hr tablet Take 1 tablet (100 mg total) by mouth daily. 08/19/16  Yes Nicholas Lose, MD  Multiple Vitamins-Minerals (MULTIVITAMIN WITH MINERALS) tablet Take 1 tablet by mouth daily.     Yes [provider]  ondansetron (ZOFRAN-ODT) 4 MG disintegrating tablet Take 4 mg by mouth every 6 (six) hours as needed for nausea/vomiting. 06/28/17  Yes [provider]  predniSONE (DELTASONE) 50 MG tablet Take 1 tablet 13, 7, and 1 hour prior to CT scan. 06/13/17  Yes Nicholas Lose, MD  cephALEXin (KEFLEX) 500 MG capsule Take 1 capsule (500 mg total) by mouth 4 (four) times daily. Patient not taking: Reported on 06/29/2017 06/13/17   Nicholas Lose, MD    Current Facility-Administered Medications  Medication Dose Route Frequency Provider Last Rate Last Dose  . 0.9 %  sodium chloride infusion   Intravenous Continuous Donnie Mesa, MD 100 mL/hr at 06/30/17 0043 1,000 mL at 06/30/17 0043  . cefTRIAXone (ROCEPHIN) 1 g in dextrose 5 % 50 mL IVPB  1 g Intravenous Q24H Johnathan Hausen, MD   Stopped at 06/29/17 1807  . diphenhydrAMINE (BENADRYL) 12.5 MG/5ML elixir 12.5 mg  12.5 mg Oral Q6H PRN Donnie Mesa, MD       Or  . diphenhydrAMINE  (BENADRYL) injection 12.5 mg  12.5 mg Intravenous Q6H PRN Donnie Mesa, MD      . enoxaparin (LOVENOX) injection 40 mg  40 mg Subcutaneous Q24H Donnie Mesa, MD   40 mg at 06/30/17 0846  . feeding supplement (ENSURE ENLIVE) (ENSURE ENLIVE) liquid 237 mL  237 mL Oral BID BM Donnie Mesa, MD   237 mL at 06/29/17 1210  . ibuprofen (ADVIL,MOTRIN) tablet 400 mg  400 mg Oral Q6H PRN Donnie Mesa, MD      . levothyroxine (SYNTHROID, LEVOTHROID) tablet 75 mcg  75 mcg Oral QAC breakfast Donnie Mesa, MD   75 mcg at 06/30/17  0347  . metoprolol succinate (TOPROL-XL) 24 hr tablet 100 mg  100 mg Oral Daily Donnie Mesa, MD      . morphine 2 MG/ML injection 2-4 mg  2-4 mg Intravenous Q2H PRN Donnie Mesa, MD   2 mg at 06/30/17 0837  . ondansetron (ZOFRAN-ODT) disintegrating tablet 4 mg  4 mg Oral Q6H PRN Donnie Mesa, MD   4 mg at 06/29/17 1741   Or  . ondansetron (ZOFRAN) injection 4 mg  4 mg Intravenous Q6H PRN Donnie Mesa, MD      . oxyCODONE (Oxy IR/ROXICODONE) immediate release tablet 5-10 mg  5-10 mg Oral Q4H PRN Donnie Mesa, MD   5 mg at 06/29/17 2253  . prochlorperazine (COMPAZINE) tablet 10 mg  10 mg Oral Q6H PRN Donnie Mesa, MD   10 mg at 06/29/17 1128   Or  . prochlorperazine (COMPAZINE) injection 5-10 mg  5-10 mg Intravenous Q6H PRN Donnie Mesa, MD       Facility-Administered Medications Ordered in Other Encounters  Medication Dose Route Frequency Provider Last Rate Last Dose  . 0.9 %  sodium chloride infusion   Intravenous Continuous Donnie Mesa, MD      . acetaminophen (TYLENOL) tablet 975 mg  975 mg Oral Q6H Donnie Mesa, MD      . enoxaparin (LOVENOX) injection 40 mg  40 mg Subcutaneous Q24H Donnie Mesa, MD      . morphine 4 MG/ML injection 2-4 mg  2-4 mg Intravenous Q2H PRN Donnie Mesa, MD      . ondansetron (ZOFRAN-ODT) disintegrating tablet 4 mg  4 mg Oral Q6H PRN Donnie Mesa, MD       Or  . ondansetron (ZOFRAN) 4 mg in sodium chloride 0.9 % 50 mL  IVPB  4 mg Intravenous Q6H PRN Donnie Mesa, MD      . oxyCODONE (Oxy IR/ROXICODONE) immediate release tablet 5-10 mg  5-10 mg Oral Q4H PRN Donnie Mesa, MD      . piperacillin-tazobactam (ZOSYN) 3.375 g in dextrose 5 % 50 mL IVPB  3.375 g Intravenous Q8H Donnie Mesa, MD      . prochlorperazine (COMPAZINE) tablet 10 mg  10 mg Oral Q6H PRN Donnie Mesa, MD       Or  . prochlorperazine (COMPAZINE) injection 5-10 mg  5-10 mg Intravenous Q6H PRN Donnie Mesa, MD        Allergies as of 06/29/2017 - Review Complete 06/29/2017  Allergen Reaction Noted  . Contrast media [iodinated diagnostic agents] Shortness Of Breath and Rash 08/23/2011  . Food Shortness Of Breath 12/16/2011  . Pravastatin Other (See Comments) 12/26/2016  . Zosyn [piperacillin sod-tazobactam so] Rash and Other (See Comments) 06/29/2017     Review of Systems:    Constitutional: Fever prior to admit Skin: Skin lesions per HPI Cardiovascular: No chest pain Respiratory: No SOB  Gastrointestinal: See HPI and otherwise negative Genitourinary: No dysuria  Neurological: No headache Musculoskeletal: No new muscle or joint pain Hematologic: No bleeding or bruising Psychiatric: No history of depression or anxiety   Physical Exam:  Vital signs in last 24 hours: Temp:  [98.2 F (36.8 C)-100.3 F (37.9 C)] 100.3 F (37.9 C) (09/14 0535) Pulse Rate:  [85-90] 90 (09/14 0535) Resp:  [16-18] 16 (09/14 0535) BP: (111-135)/(67-77) 125/74 (09/14 0535) SpO2:  [95 %-98 %] 95 % (09/14 0535) Weight:  [155 lb (70.3 kg)] 155 lb (70.3 kg) (09/13 1931) Last BM Date: 06/28/17 General:   Pleasant Caucasian female appears to be in NAD, Well  developed, Well nourished, alert and cooperative Head:  Normocephalic and atraumatic. Eyes:   PEERL, EOMI. No icterus. Conjunctiva pink. Ears:  Normal auditory acuity. Neck:  Supple Throat: Oral cavity and pharynx without inflammation, swelling or lesion. Lungs: Respirations even and  unlabored. Lungs clear to auscultation bilaterally.   No wheezes, crackles, or rhonchi.  Heart: Normal S1, S2. No MRG. Regular rate and rhythm. No peripheral edema, cyanosis or pallor.  Abdomen:  Soft, nondistended, nontender. No rebound or guarding. Normal bowel sounds. No appreciable masses or hepatomegaly. Rectal:  Not performed.  Msk:  Symmetrical without gross deformities.  Extremities:  Without edema, no deformity or joint abnormality.  Neurologic:  Alert and  oriented x4;  grossly normal neurologically.  Skin:   Dry and intact without significant lesions or rashes. Psychiatric: Demonstrates good judgement and reason without abnormal affect or behaviors.   LAB RESULTS:  Recent Labs  06/29/17 0959 06/30/17 0002  WBC 10.5 7.9  HGB 11.0* 10.6*  HCT 33.4* 31.6*  PLT 264 279   BMET  Recent Labs  06/29/17 0959 06/30/17 0002  NA 135 136  K 3.6 3.3*  CL 98* 101  CO2 26 28  GLUCOSE 101* 101*  BUN 20 15  CREATININE 0.68 0.53  CALCIUM 9.3 8.6*   Hepatic Function Latest Ref Rng & Units 06/30/2017 06/29/2017 05/24/2017  Total Protein 6.5 - 8.1 g/dL 6.2(L) 7.1 7.7  Albumin 3.5 - 5.0 g/dL 2.8(L) 3.5 4.2  AST 15 - 41 U/L 312(H) 204(H) 33  ALT 14 - 54 U/L 226(H) 120(H) 27  Alk Phosphatase 38 - 126 U/L 372(H) 327(H) 128  Total Bilirubin 0.3 - 1.2 mg/dL 0.7 0.9 0.58    PREVIOUS ENDOSCOPIES:            None   Impression / Plan:   Impression: 1. 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 Ordered RUQ U/S 2. Ordered Hep C Ab, Hep B Surface Ag, Ab and core Ab also INR and LFT for tomorrow 3. Please await further recommendations from Dr. Loletha Carrow  Thank you for your kind consultation, we will continue to follow.  Lavone Nian Lemmon  06/30/2017, 9:03  AM Pager #: [phone]  I have reviewed the entire case in detail with the above APP and discussed the plan in detail.  Therefore, I agree with the diagnoses recorded above. In addition,  I have personally interviewed and examined the patient and have personally reviewed any abdominal/pelvic CT scan images.  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 I am most suspicious of the Tykerb even though she has been on it over a year. Keflex seems an unlikely suspect, and she did also receive general anesthesia about a week ago. So far, bilirubin is normal.  Repeat LFTs tomorrow and INR. This type of chemotherapeutic agent seems unlikely to cause reactivation of latent hepatitis, but we will check a panel to be sure. Update liver imaging to make sure no infiltrative disease, though the rising LFTs from yesterday to today seems to generally speak against that.  Dr. Henrene Pastor will follow this patient over the weekend.   Nelida Meuse III Pager  [phone]  Mon-Fri 8a-5p [phone] after 5p, weekends, holidays

## 2017-06-30 NOTE — Progress Notes (Signed)
Pharmacy Antibiotic Note  [name] Riley is a 66 y.o. female admitted on 06/29/2017 with wound infection after resection of chest wall malignant nodules.  Pharmacy has been consulted for vancomycin dosing. Also on ceftriaxone.  PMH significant for breast cancer on chemotherapy, currently held this admission due to active infection. Per chart review, patient previously grew MRSA in abdominal wall wound infection last year.  Plan: Vancomycin 1g IV q12h.   Height: 5\' 8"  (172.7 cm) Weight: 155 lb (70.3 kg) IBW/kg (Calculated) : 63.9  Temp (24hrs), Avg:99.2 F (37.3 C), Min:98.2 F (36.8 C), Max:100.3 F (37.9 C)   Recent Labs Lab 06/29/17 0959 06/30/17 0002  WBC 10.5 7.9  CREATININE 0.68 0.53    Estimated Creatinine Clearance: 71.7 mL/min (by C-G formula based on SCr of 0.53 mg/dL).    Allergies  Allergen Reactions  . Contrast Media [Iodinated Diagnostic Agents] Shortness Of Breath and Rash  . Food Shortness Of Breath    Cantaloupe  . Pravastatin Other (See Comments)    Legs hurt  . Zosyn [Piperacillin Sod-Tazobactam So] Rash and Other (See Comments)    Temperature increase, facial flushing    Antimicrobials this admission: 9/13 Ceftriaxone >> 9/14 Vancomycin >>  Dose adjustments this admission:  Microbiology results: 9/13 MRSA PCR: positive  Thank you for allowing pharmacy to be a part of this patient's care.  Hershal Coria 06/30/2017 8:57 AM

## 2017-07-01 ENCOUNTER — Observation Stay (HOSPITAL_COMMUNITY): Payer: 59

## 2017-07-01 ENCOUNTER — Encounter (HOSPITAL_COMMUNITY): Payer: Self-pay | Admitting: Certified Registered Nurse Anesthetist

## 2017-07-01 ENCOUNTER — Observation Stay (HOSPITAL_COMMUNITY): Payer: 59 | Admitting: Certified Registered Nurse Anesthetist

## 2017-07-01 ENCOUNTER — Encounter (HOSPITAL_COMMUNITY)
Admission: AD | Disposition: A | Discharge status: Home Health | Payer: Self-pay | Source: Ambulatory Visit | Attending: Surgery

## 2017-07-01 DIAGNOSIS — Z7982 Long term (current) use of aspirin: Secondary | ICD-10-CM | POA: Diagnosis not present

## 2017-07-01 DIAGNOSIS — Z923 Personal history of irradiation: Secondary | ICD-10-CM | POA: Diagnosis not present

## 2017-07-01 DIAGNOSIS — Z9013 Acquired absence of bilateral breasts and nipples: Secondary | ICD-10-CM | POA: Diagnosis not present

## 2017-07-01 DIAGNOSIS — R945 Abnormal results of liver function studies: Secondary | ICD-10-CM | POA: Diagnosis not present

## 2017-07-01 DIAGNOSIS — E039 Hypothyroidism, unspecified: Secondary | ICD-10-CM | POA: Diagnosis present

## 2017-07-01 DIAGNOSIS — T814XXA Infection following a procedure, initial encounter: Secondary | ICD-10-CM | POA: Diagnosis present

## 2017-07-01 DIAGNOSIS — L03313 Cellulitis of chest wall: Secondary | ICD-10-CM | POA: Diagnosis present

## 2017-07-01 DIAGNOSIS — Z90710 Acquired absence of both cervix and uterus: Secondary | ICD-10-CM | POA: Diagnosis not present

## 2017-07-01 DIAGNOSIS — K719 Toxic liver disease, unspecified: Secondary | ICD-10-CM | POA: Diagnosis present

## 2017-07-01 DIAGNOSIS — Z9221 Personal history of antineoplastic chemotherapy: Secondary | ICD-10-CM | POA: Diagnosis not present

## 2017-07-01 DIAGNOSIS — I10 Essential (primary) hypertension: Secondary | ICD-10-CM | POA: Diagnosis present

## 2017-07-01 DIAGNOSIS — C50912 Malignant neoplasm of unspecified site of left female breast: Secondary | ICD-10-CM | POA: Diagnosis present

## 2017-07-01 DIAGNOSIS — Z79899 Other long term (current) drug therapy: Secondary | ICD-10-CM | POA: Diagnosis not present

## 2017-07-01 HISTORY — PX: INCISION AND DRAINAGE OF WOUND: SHX1803

## 2017-07-01 LAB — HEPATITIS B SURFACE ANTIBODY,QUALITATIVE: Hep B S Ab: NONREACTIVE

## 2017-07-01 LAB — HEPATITIS C ANTIBODY: HCV Ab: <0.1 s/co ratio (ref 0.0–0.9)

## 2017-07-01 LAB — HEPATIC FUNCTION PANEL
ALT: 314 U/L — ABNORMAL HIGH (ref 14–54)
AST: 361 U/L — ABNORMAL HIGH (ref 15–41)
Albumin: 2.8 g/dL — ABNORMAL LOW (ref 3.5–5.0)
Alkaline Phosphatase: 489 U/L — ABNORMAL HIGH (ref 38–126)
Bilirubin, Direct: 0.2 mg/dL (ref 0.1–0.5)
Indirect Bilirubin: 0.3 mg/dL (ref 0.3–0.9)
Total Bilirubin: 0.5 mg/dL (ref 0.3–1.2)
Total Protein: 5.9 g/dL — ABNORMAL LOW (ref 6.5–8.1)

## 2017-07-01 LAB — HEPATITIS B CORE ANTIBODY, IGM: Hep B C IgM: NEGATIVE

## 2017-07-01 SURGERY — IRRIGATION AND DEBRIDEMENT WOUND
Anesthesia: General | Site: Chest | Laterality: Left

## 2017-07-01 MED ORDER — METOCLOPRAMIDE HCL 5 MG/ML IJ SOLN: 10.0000 mg | Freq: Once | INTRAMUSCULAR | Status: DC | PRN

## 2017-07-01 MED ORDER — MIDAZOLAM HCL 5 MG/5ML IJ SOLN
INTRAMUSCULAR | Status: DC | PRN
  Administered 2017-07-01: 2 mg via INTRAVENOUS

## 2017-07-01 MED ORDER — LACTATED RINGERS IV SOLN: INTRAVENOUS | Status: DC

## 2017-07-01 MED ORDER — FENTANYL CITRATE (PF) 100 MCG/2ML IJ SOLN: 25.0000 ug | INTRAMUSCULAR | Status: DC | PRN

## 2017-07-01 MED ORDER — VANCOMYCIN HCL IN DEXTROSE 1-5 GM/200ML-% IV SOLN
INTRAVENOUS | Status: AC
  Filled 2017-07-01: qty 200

## 2017-07-01 MED ORDER — LACTATED RINGERS IV SOLN
INTRAVENOUS | Status: DC | PRN
  Administered 2017-07-01 (×2): via INTRAVENOUS

## 2017-07-01 MED ORDER — METOCLOPRAMIDE HCL 5 MG/ML IJ SOLN
INTRAMUSCULAR | Status: DC | PRN
  Administered 2017-07-01: 10 mg via INTRAVENOUS

## 2017-07-01 MED ORDER — PROPOFOL 10 MG/ML IV BOLUS
INTRAVENOUS | Status: DC | PRN
  Administered 2017-07-01: 150 mg via INTRAVENOUS

## 2017-07-01 MED ORDER — BUPIVACAINE-EPINEPHRINE (PF) 0.25% -1:200000 IJ SOLN
INTRAMUSCULAR | Status: AC
  Filled 2017-07-01: qty 30

## 2017-07-01 MED ORDER — LIDOCAINE 2% (20 MG/ML) 5 ML SYRINGE
INTRAMUSCULAR | Status: DC | PRN
  Administered 2017-07-01: 80 mg via INTRAVENOUS

## 2017-07-01 MED ORDER — ONDANSETRON HCL 4 MG/2ML IJ SOLN
INTRAMUSCULAR | Status: DC | PRN
  Administered 2017-07-01 (×2): 4 mg via INTRAVENOUS

## 2017-07-01 MED ORDER — DEXAMETHASONE SODIUM PHOSPHATE 10 MG/ML IJ SOLN
INTRAMUSCULAR | Status: AC
  Filled 2017-07-01: qty 1

## 2017-07-01 MED ORDER — BUPIVACAINE HCL (PF) 0.5 % IJ SOLN
INTRAMUSCULAR | Status: AC
  Filled 2017-07-01: qty 30

## 2017-07-01 MED ORDER — 0.9 % SODIUM CHLORIDE (POUR BTL) OPTIME
TOPICAL | Status: DC | PRN
  Administered 2017-07-01: 1000 mL

## 2017-07-01 MED ORDER — MEPERIDINE HCL 50 MG/ML IJ SOLN: 6.2500 mg | INTRAMUSCULAR | Status: DC | PRN

## 2017-07-01 MED ORDER — MIDAZOLAM HCL 2 MG/2ML IJ SOLN
INTRAMUSCULAR | Status: AC
  Filled 2017-07-01: qty 2

## 2017-07-01 MED ORDER — DEXAMETHASONE SODIUM PHOSPHATE 10 MG/ML IJ SOLN
INTRAMUSCULAR | Status: DC | PRN
  Administered 2017-07-01: 10 mg via INTRAVENOUS

## 2017-07-01 MED ORDER — FENTANYL CITRATE (PF) 100 MCG/2ML IJ SOLN
INTRAMUSCULAR | Status: DC | PRN
  Administered 2017-07-01 (×2): 50 ug via INTRAVENOUS

## 2017-07-01 MED ORDER — ONDANSETRON HCL 4 MG/2ML IJ SOLN
INTRAMUSCULAR | Status: AC
  Filled 2017-07-01: qty 2

## 2017-07-01 MED ORDER — FENTANYL CITRATE (PF) 100 MCG/2ML IJ SOLN
INTRAMUSCULAR | Status: AC
  Filled 2017-07-01: qty 2

## 2017-07-01 MED ORDER — SCOPOLAMINE 1 MG/3DAYS TD PT72
1.0000 | MEDICATED_PATCH | TRANSDERMAL | Status: DC
  Administered 2017-07-01: 1.5 mg via TRANSDERMAL

## 2017-07-01 MED ORDER — PROPOFOL 10 MG/ML IV BOLUS
INTRAVENOUS | Status: AC
  Filled 2017-07-01: qty 20

## 2017-07-01 MED ORDER — LIDOCAINE 2% (20 MG/ML) 5 ML SYRINGE
INTRAMUSCULAR | Status: AC
  Filled 2017-07-01: qty 5

## 2017-07-01 MED ORDER — SCOPOLAMINE 1 MG/3DAYS TD PT72
MEDICATED_PATCH | TRANSDERMAL | Status: AC
  Filled 2017-07-01: qty 1

## 2017-07-01 SURGICAL SUPPLY — 33 items
BINDER ABDOMINAL 12 ML 46-62 (SOFTGOODS) IMPLANT
BLADE HEX COATED 2.75 (ELECTRODE) ×3 IMPLANT
BNDG GAUZE ELAST 4 BULKY (GAUZE/BANDAGES/DRESSINGS) ×3 IMPLANT
COVER SURGICAL LIGHT HANDLE (MISCELLANEOUS) ×3 IMPLANT
DECANTER SPIKE VIAL GLASS SM (MISCELLANEOUS) IMPLANT
DRAPE LAPAROTOMY T 102X78X121 (DRAPES) ×3 IMPLANT
DRAPE UTILITY XL STRL (DRAPES) ×3 IMPLANT
DRSG PAD ABDOMINAL 8X10 ST (GAUZE/BANDAGES/DRESSINGS) ×9 IMPLANT
ELECT REM PT RETURN 15FT ADLT (MISCELLANEOUS) ×3 IMPLANT
GAUZE SPONGE 4X4 12PLY STRL (GAUZE/BANDAGES/DRESSINGS) ×3 IMPLANT
GLOVE BIOGEL PI IND STRL 7.0 (GLOVE) ×1 IMPLANT
GLOVE BIOGEL PI IND STRL 7.5 (GLOVE) ×1 IMPLANT
GLOVE BIOGEL PI INDICATOR 7.0 (GLOVE) ×2
GLOVE BIOGEL PI INDICATOR 7.5 (GLOVE) ×2
GLOVE EUDERMIC 7 POWDERFREE (GLOVE) ×3 IMPLANT
GOWN STRL REUS W/TWL LRG LVL3 (GOWN DISPOSABLE) ×6 IMPLANT
GOWN STRL REUS W/TWL XL LVL3 (GOWN DISPOSABLE) ×3 IMPLANT
HANDPIECE INTERPULSE COAX TIP (DISPOSABLE) ×2
KIT BASIN OR (CUSTOM PROCEDURE TRAY) ×3 IMPLANT
NEEDLE HYPO 22GX1.5 SAFETY (NEEDLE) ×3 IMPLANT
NS IRRIG 1000ML POUR BTL (IV SOLUTION) ×3 IMPLANT
PACK GENERAL/GYN (CUSTOM PROCEDURE TRAY) ×3 IMPLANT
PAD ABD 8X10 STRL (GAUZE/BANDAGES/DRESSINGS) ×3 IMPLANT
SET HNDPC FAN SPRY TIP SCT (DISPOSABLE) ×1 IMPLANT
STAPLER VISISTAT 35W (STAPLE) ×3 IMPLANT
SUT NOVA NAB GS-22 2 0 T19 (SUTURE) IMPLANT
SUT PDS AB 1 CTX 36 (SUTURE) IMPLANT
SUT PROLENE 0 CT 2 (SUTURE) IMPLANT
SUT SILK 2 0 (SUTURE)
SUT SILK 2-0 18XBRD TIE 12 (SUTURE) IMPLANT
SYR CONTROL 10ML LL (SYRINGE) ×3 IMPLANT
TAPE CLOTH SURG 6X10 WHT LF (GAUZE/BANDAGES/DRESSINGS) ×3 IMPLANT
TOWEL OR 17X26 10 PK STRL BLUE (TOWEL DISPOSABLE) ×3 IMPLANT

## 2017-07-01 NOTE — Anesthesia Postprocedure Evaluation (Signed)
Anesthesia Post Note  Patient: [name] Riley  Procedure(s) Performed: Procedure(s) (LRB): IRRIGATION AND DEBRIDEMENT CHEST WALL ABSCESS (Left)     Patient location during evaluation: PACU Anesthesia Type: General Level of consciousness: awake and alert Pain management: pain level controlled Vital Signs Assessment: post-procedure vital signs reviewed and stable Respiratory status: spontaneous breathing, nonlabored ventilation, respiratory function stable and patient connected to nasal cannula oxygen Cardiovascular status: blood pressure returned to baseline and stable Postop Assessment: no apparent nausea or vomiting Anesthetic complications: no    Last Vitals:  Vitals:   07/01/17 1114 07/01/17 1224  BP: (!) 142/76 139/72  Pulse: 88 82  Resp: 14   Temp:  36.8 C  SpO2: 94% 94%    Last Pain:  Vitals:   07/01/17 1224  TempSrc: Oral  PainSc:                  [name]

## 2017-07-01 NOTE — Progress Notes (Signed)
Day of Surgery   Subjective/Chief Complaint: Feels slightly better Required some pain meds last night RUQ bumped because of patients in ED LFT's continue to increase   Objective: Vital signs in last 24 hours: Temp:  [97.7 F (36.5 C)-99.7 F (37.6 C)] 99.1 F (37.3 C) (09/15 0522) Pulse Rate:  [81-85] 85 (09/15 0522) Resp:  [16-18] 18 (09/15 0522) BP: (124-139)/(68-76) 139/76 (09/15 0522) SpO2:  [96 %-97 %] 96 % (09/15 0522) Last BM Date: 06/28/17  Intake/Output from previous day: 09/14 0701 - 09/15 0700 In: 3040 [P.O.:240; I.V.:2400; IV Piggyback:400] Out: 1950 [BTDHR:4163] Intake/Output this shift: No intake/output data recorded.  General appearance: alert, cooperative and no distress Chest wall: left sided chest wall tenderness Erythema slightly decreased; large amount of purulent drainage  Lab Results:   Recent Labs  06/29/17 0959 06/30/17 0002  WBC 10.5 7.9  HGB 11.0* 10.6*  HCT 33.4* 31.6*  PLT 264 279   BMET  Recent Labs  06/29/17 0959 06/30/17 0002  NA 135 136  K 3.6 3.3*  CL 98* 101  CO2 26 28  GLUCOSE 101* 101*  BUN 20 15  CREATININE 0.68 0.53  CALCIUM 9.3 8.6*   Hepatic Function Latest Ref Rng & Units 07/01/2017 06/30/2017 06/29/2017  Total Protein 6.5 - 8.1 g/dL 5.9(L) 6.2(L) 7.1  Albumin 3.5 - 5.0 g/dL 2.8(L) 2.8(L) 3.5  AST 15 - 41 U/L 361(H) 312(H) 204(H)  ALT 14 - 54 U/L 314(H) 226(H) 120(H)  Alk Phosphatase 38 - 126 U/L 489(H) 372(H) 327(H)  Total Bilirubin 0.3 - 1.2 mg/dL 0.5 0.7 0.9  Bilirubin, Direct 0.1 - 0.5 mg/dL 0.2 - -    PT/INR  Recent Labs  06/30/17 1116  LABPROT 13.1  INR 1.00   ABG No results for input(s): PHART, HCO3 in the last 72 hours.  Invalid input(s): PCO2, PO2  Studies/Results: No results found.  Anti-infectives: Anti-infectives    Start     Dose/Rate Route Frequency Ordered Stop   06/30/17 1000  vancomycin (VANCOCIN) IVPB 1000 mg/200 mL premix     1,000 mg 200 mL/hr over 60 Minutes Intravenous  Every 12 hours 06/30/17 0904     06/29/17 1700  cefTRIAXone (ROCEPHIN) 1 g in dextrose 5 % 50 mL IVPB     1 g 100 mL/hr over 30 Minutes Intravenous Every 24 hours 06/29/17 1622     06/29/17 1200  piperacillin-tazobactam (ZOSYN) IVPB 3.375 g  Status:  Discontinued    Comments:  Pharmacy to check dosing   3.375 g 12.5 mL/hr over 240 Minutes Intravenous Every 8 hours 06/29/17 0939 06/29/17 1628      Assessment/Plan: 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 Increased liver function tests - ?possibly related to chemo drugs;Appreciate GI consult  Tykerb on hold for now. Hepatitis panel negative RUQ ultrasound pending Wound infection - on Rocephin, will add VANC since MRSA swab positive and culture pending. Will need home health for dressing changes after discharge - possible discharge tomorrow.   LOS: 0 days    [name] Riley K. 07/01/2017

## 2017-07-01 NOTE — Progress Notes (Signed)
Progress Note   Subjective  Chief Complaint:elevated LFTs   Patient continues to do well this morning. There was some confusion over her diet yesterday. She ended up eating dinner and was told she could be nothing by mouth after midnight due to surgical plans today. Right upper quadrant ultrasound is also scheduled for today.    Objective   Vital signs in last 24 hours: Temp:  [97.7 F (36.5 C)-99.7 F (37.6 C)] 99.1 F (37.3 C) (09/15 0522) Pulse Rate:  [81-85] 85 (09/15 0522) Resp:  [16-18] 18 (09/15 0522) BP: (124-139)/(68-76) 139/76 (09/15 0522) SpO2:  [96 %-97 %] 96 % (09/15 0522) Last BM Date: 06/28/17 General:    white female in NAD Heart:  Regular rate and rhythm; no murmurs Lungs: Respirations even and unlabored, lungs CTA bilaterally Abdomen:  Soft, nontender and nondistended. Normal bowel sounds. Extremities:  Without edema. Neurologic:  Alert and oriented,  grossly normal neurologically. Psych:  Cooperative. Normal mood and affect.  Intake/Output from previous day: 09/14 0701 - 09/15 0700 In: 3040 [P.O.:240; I.V.:2400; IV Piggyback:400] Out: 1950 [Urine:1950]  Lab Results:  Recent Labs  06/29/17 0959 06/30/17 0002  WBC 10.5 7.9  HGB 11.0* 10.6*  HCT 33.4* 31.6*  PLT 264 279   BMET  Recent Labs  06/29/17 0959 06/30/17 0002  NA 135 136  K 3.6 3.3*  CL 98* 101  CO2 26 28  GLUCOSE 101* 101*  BUN 20 15  CREATININE 0.68 0.53  CALCIUM 9.3 8.6*   Hepatic Function Latest Ref Rng & Units 07/01/2017 06/30/2017 06/29/2017  Total Protein 6.5 - 8.1 g/dL 5.9(L) 6.2(L) 7.1  Albumin 3.5 - 5.0 g/dL 2.8(L) 2.8(L) 3.5  AST 15 - 41 U/L 361(H) 312(H) 204(H)  ALT 14 - 54 U/L 314(H) 226(H) 120(H)  Alk Phosphatase 38 - 126 U/L 489(H) 372(H) 327(H)  Total Bilirubin 0.3 - 1.2 mg/dL 0.5 0.7 0.9  Bilirubin, Direct 0.1 - 0.5 mg/dL 0.2 - -   PT/INR  Recent Labs  06/30/17 1116  LABPROT 13.1  INR 1.00      Assessment / Plan:   Assessment: 1. Elevated LFTs:  continue to increase, see above, again thought related to DILI, hep testing negative, ultrasound pending  Plan: 1. Right upper quadrant ultrasound ordered for today 2. Hep C and B testing have returned negative, INR is also normal, LFTs continue to rise 3. Please await further recommendations from Dr. Henrene Pastor  Thank you for your kind consultation.   LOS: 0 days   [name] Riley  07/01/2017, 9:06 AM  Pager # [phone]  GI ATTENDING  Case discussed with Dr. Loletha Carrow. Interval history data personally reviewed. Patient personally seen and examined. Agree with interval progress note as outlined above. Husband in room. Patient just back from operating room. Acute hepatitis studies negative. Ultrasound unremarkable. Incidental gallstone. Normal LFTs about 3 weeks ago. No evidence for hepatic synthetic dysfunction. Suspect current elevated liver tests are reactive either secondary to drug reaction (recent antibiotic or antiviral agent) and/or infection. Could be chemotherapeutic agents for her cancer, though she had been on these without issues for some time, am told. These are currently being held, which is reasonable. On no other potential culprit medications at this time. Recommend ongoing supportive care, treatment of primary problem,hold all medications witch may be implicated in the liver test abnormalities, and continue to trend LFT's. Though it may take weeks, my expectation is that the process will resolves uneventfully (without interval pertubation). We will recheck Monday, if  she is here. We will arrange appropriate outpatient GI follow up as well. Her primary GI physician is Dr. Fuller Plan. Discussed with patient and husband.  Docia Chuck. Geri Seminole., M.D. [facility] Division of Gastroenterology

## 2017-07-01 NOTE — Progress Notes (Addendum)
Call from lab to report aerobic culture was sent in wrong container. Correct swab is the red handled swab used for MRSA swabs. Dr Vonna Kotyk answering service called to leave message and to call me back with any questions. Donne Hazel, RN

## 2017-07-01 NOTE — Op Note (Signed)
Preop diagnosis: Left chest wound infection Postop diagnosis: Same Procedure performed:  Irrigation and debridement of left chest wound Surgeon:Odessie Polzin K. Anesthesia: Gen. LMA Indications:this is a 66 year old female with recurrent breast cancer to her left chest wall after mastectomy. She had excision of several large nodules that were growing and bleeding. The surgery was performed about 10 days ago. She has developed widespread cellulitis as well as a wound infection.Tried open wound at the bedside but we are unable to fully open the wound because of discomfort. She presents now for irrigation and debridement under anesthesia.  The patient is brought to the operating room placed in a supine position on the operating room table.  After an adequate level of general anesthesia was obtained, a small polyp was placed underneath her left shoulder. The old dressing was removed. The skin was prepped with Betadine and we draped sterile fashion.   Timeout was taken to ensure the proper patient and proper procedure. I removed the remaining deep subcutaneous sutures. There are some purulent fluid that is trapped underneath the flaps. Cautery was used for hemostasis. We irrigated the wound thoroughly with 1 L of normal saline There are no other undrained pockets of purulence. The wound was then packed with saline moistened gauze and a dry dressing was applied. She was extubated and brought to the room in stable condition.All sponge, instrument, and needle counts are correct.  Imogene Burn. [name], MD, [facility] Surgery  General/ Trauma Surgery  07/01/2017 9:47 AM

## 2017-07-01 NOTE — Anesthesia Preprocedure Evaluation (Signed)
Anesthesia Evaluation  Patient identified by MRN, date of birth, ID band Patient awake    Reviewed: Allergy & Precautions, H&P , NPO status , Patient's Chart, lab work & pertinent test results, reviewed documented beta blocker date and time   History of Anesthesia Complications (+) PONV and history of anesthetic complications  Airway Mallampati: I  TM Distance: >3 FB Neck ROM: Full    Dental  (+) Dental Advisory Given   Pulmonary    breath sounds clear to auscultation       Cardiovascular hypertension, Pt. on medications and Pt. on home beta blockers + CAD and + Past MI   Rhythm:Regular Rate:Normal     Neuro/Psych    GI/Hepatic   Endo/Other  Hypothyroidism   Renal/GU      Musculoskeletal   Abdominal   Peds  Hematology   Anesthesia Other Findings Echo 01/31/17 (done for chemo monitoring):  - Left ventricle: The cavity size was normal. Wall thickness wasincreased in a pattern of mild LVH. Systolic function was normal.The estimated ejection fraction was in the range of 60% to 65%.Wall motion was normal; there were no regional wall motionabnormalities. Doppler parameters are consistent with abnormalleft ventricular relaxation (grade 1 diastolic dysfunction). - Aortic valve: There was mild regurgitation. - Mitral valve: There was mild regurgitation. - Left atrium: The atrium was mildly dilated. - Tricuspid valve: There was mild regurgitation.  Nuclear stress test 10/24/14:  1. No definitive scintigraphic evidence of prior infarction or pharmacologically induced ischemia. 2. Normal left ventricular wall motion. 3. Left ventricular ejection fraction 61% 4. Low-risk stress test findings  Cardiac cath 09/26/00:  - LM: Normal - LAD: Normal in its proximal segment. Mid segment there was a dissection plane with subsequent occlusion of the distal and apical LAD. - CX: Normal - RCA: Dominant, normal  Reproductive/Obstetrics                             Anesthesia Physical  Anesthesia Plan  ASA: III  Anesthesia Plan: General   Post-op Pain Management:    Induction: Intravenous  PONV Risk Score and Plan: 3 and Ondansetron, Dexamethasone, Midazolam, Treatment may vary due to age or medical condition and Scopolamine patch - Pre-op  Airway Management Planned: LMA and Oral ETT  Additional Equipment:   Intra-op Plan:   Post-operative Plan: Extubation in OR  Informed Consent:   Dental advisory given  Plan Discussed with: CRNA and Anesthesiologist  Anesthesia Plan Comments:         Anesthesia Quick Evaluation

## 2017-07-01 NOTE — Transfer of Care (Signed)
Immediate Anesthesia Transfer of Care Note  Patient: [name] Riley  Procedure(s) Performed: Procedure(s): IRRIGATION AND DEBRIDEMENT CHEST WALL ABSCESS (Left)  Patient Location: PACU  Anesthesia Type:General  Level of Consciousness:  sedated, patient cooperative and responds to stimulation  Airway & Oxygen Therapy:Patient Spontanous Breathing and Patient connected to face mask oxgen  Post-op Assessment:  Report given to PACU RN and Post -op Vital signs reviewed and stable  Post vital signs:  Reviewed and stable  Last Vitals:  Vitals:   06/30/17 2143 07/01/17 0522  BP: 124/68 139/76  Pulse: 81 85  Resp: 18 18  Temp: 36.5 C 37.3 C  SpO2: 96% 72%    Complications: No apparent anesthesia complications

## 2017-07-01 NOTE — Anesthesia Procedure Notes (Signed)
Procedure Name: LMA Insertion Date/Time: 07/01/2017 9:15 AM Performed by: Montel Clock Pre-anesthesia Checklist: Patient identified, Emergency Drugs available, Suction available, Patient being monitored and Timeout performed Patient Re-evaluated:Patient Re-evaluated prior to induction Oxygen Delivery Method: Circle system utilized Preoxygenation: Pre-oxygenation with 100% oxygen Induction Type: IV induction Ventilation: Mask ventilation without difficulty LMA: LMA with gastric port inserted LMA Size: 3.0 Number of attempts: 1 Dental Injury: Teeth and Oropharynx as per pre-operative assessment

## 2017-07-02 ENCOUNTER — Encounter (HOSPITAL_COMMUNITY): Payer: Self-pay | Admitting: Surgery

## 2017-07-02 LAB — CBC
HCT: 28.9 % — ABNORMAL LOW (ref 36.0–46.0)
Hemoglobin: 9.7 g/dL — ABNORMAL LOW (ref 12.0–15.0)
MCH: 30.4 pg (ref 26.0–34.0)
MCHC: 33.6 g/dL (ref 30.0–36.0)
MCV: 90.6 fL (ref 78.0–100.0)
Platelets: 306 10*3/uL (ref 150–400)
RBC: 3.19 MIL/uL — ABNORMAL LOW (ref 3.87–5.11)
RDW: 13.3 % (ref 11.5–15.5)
WBC: 10.3 10*3/uL (ref 4.0–10.5)

## 2017-07-02 LAB — COMPREHENSIVE METABOLIC PANEL
ALT: 292 U/L — ABNORMAL HIGH (ref 14–54)
AST: 247 U/L — ABNORMAL HIGH (ref 15–41)
Albumin: 2.7 g/dL — ABNORMAL LOW (ref 3.5–5.0)
Alkaline Phosphatase: 473 U/L — ABNORMAL HIGH (ref 38–126)
Anion gap: 10 (ref 5–15)
BUN: 9 mg/dL (ref 6–20)
CO2: 25 mmol/L (ref 22–32)
Calcium: 9 mg/dL (ref 8.9–10.3)
Chloride: 101 mmol/L (ref 101–111)
Creatinine, Ser: 0.52 mg/dL (ref 0.44–1.00)
GFR calc Af Amer: >60 mL/min (ref >60)
GFR calc non Af Amer: >60 mL/min (ref >60)
Glucose, Bld: 146 mg/dL — ABNORMAL HIGH (ref 65–99)
Potassium: 3.8 mmol/L (ref 3.5–5.1)
Sodium: 136 mmol/L (ref 135–145)
Total Bilirubin: 0.4 mg/dL (ref 0.3–1.2)
Total Protein: 6.2 g/dL — ABNORMAL LOW (ref 6.5–8.1)

## 2017-07-02 MED ORDER — CHLORHEXIDINE GLUCONATE CLOTH 2 % EX PADS
6.0000 | MEDICATED_PAD | Freq: Every day | CUTANEOUS | Status: DC
  Administered 2017-07-02 – 2017-07-03 (×2): 6 via TOPICAL

## 2017-07-02 NOTE — Progress Notes (Signed)
Report from [place], [place]. Care assumed for pt at 1530.  Pt resting in bed, denies pain/discomfort. Assessment unchanged since AM assessment. Dsg to left chest c/d/i. Will monitor.

## 2017-07-02 NOTE — Progress Notes (Addendum)
Notified Dr. Excell Seltzer of pt experiencing butterfly rash on face at 1030 this morning.  No other rash on body, no itching, and no difficulty breathing or swallowing at that time.  No medications had been given at that time.  Pt received vancomycin at 2200 the night before with no reaction.  Pt reports being allergic to cantaloupe with an anaphylactic reaction with ED visit in the past.  Around 1030 pt consumed breakfast including fresh fruit.  Unsure if fruit had been mixed with cantaloupe prior to pt consuming.  Pt reports butterfly rash decreasing.  Informed pt to inform RN if rash should get worse or trouble breathing or itching.  Informed Dr. Excell Seltzer.  No further orders other than to monitor and inform should patient get worse.  Will continue to monitor patient.  Informed Dr. Excell Seltzer of the need for an aerobic culture from pt's L breast wound to compare to anerobic culture.  Order placed for superficial wound.  RN will complete.    No further concerns at this time.  Will continue to monitor.  Iantha Fallen RN 1:47 PM 07/02/2017   Lab stated that since the wound had been debrided already, then the aerobic culture would not be accurate.  So no need to get an aerobic wound culture at this time and will continue with the anerobic culture results.  Iantha Fallen RN 2:07 PM 07/02/2017

## 2017-07-02 NOTE — Progress Notes (Signed)
1 Day Post-Op   Subjective/Chief Complaint: Doing well.  Had some soreness this AM.    Objective: Vital signs in last 24 hours: Temp:  [97.3 F (36.3 C)-98.6 F (37 C)] 98.6 F (37 C) (09/16 0600) Pulse Rate:  [66-90] 69 (09/16 0600) Resp:  [11-19] 18 (09/16 0600) BP: (110-143)/(54-84) 129/61 (09/16 0600) SpO2:  [92 %-100 %] 96 % (09/16 0600) Last BM Date: 06/28/17  Intake/Output from previous day: 09/15 0701 - 09/16 0700 In: 2340 [P.O.:240; I.V.:1900; IV Piggyback:200] Out: 2050 [Urine:2050] Intake/Output this shift: No intake/output data recorded.  General appearance: alert, cooperative and no distress Chest wall: left sided chest wall tenderness Dressing changed.  Good beefy red granulation tissue.  serosang drainage on dressing, minimal in wound.  No surrounding erythema.  Lab Results:   Recent Labs  06/30/17 0002 07/01/17 2347  WBC 7.9 10.3  HGB 10.6* 9.7*  HCT 31.6* 28.9*  PLT 279 306   BMET  Recent Labs  06/30/17 0002 07/01/17 2347  NA 136 136  K 3.3* 3.8  CL 101 101  CO2 28 25  GLUCOSE 101* 146*  BUN 15 9  CREATININE 0.53 0.52  CALCIUM 8.6* 9.0   Hepatic Function Latest Ref Rng & Units 07/01/2017 07/01/2017 06/30/2017  Total Protein 6.5 - 8.1 g/dL 6.2(L) 5.9(L) 6.2(L)  Albumin 3.5 - 5.0 g/dL 2.7(L) 2.8(L) 2.8(L)  AST 15 - 41 U/L 247(H) 361(H) 312(H)  ALT 14 - 54 U/L 292(H) 314(H) 226(H)  Alk Phosphatase 38 - 126 U/L 473(H) 489(H) 372(H)  Total Bilirubin 0.3 - 1.2 mg/dL 0.4 0.5 0.7  Bilirubin, Direct 0.1 - 0.5 mg/dL - 0.2 -    PT/INR  Recent Labs  06/30/17 1116  LABPROT 13.1  INR 1.00   ABG No results for input(s): PHART, HCO3 in the last 72 hours.  Invalid input(s): PCO2, PO2  Studies/Results: US Abdomen Limited Ruq  Result Date: 07/01/2017 CLINICAL DATA:  Elevated LFTs. EXAM: ULTRASOUND ABDOMEN LIMITED RIGHT UPPER QUADRANT COMPARISON:  CT abdomen and pelvis - 01/24/2017 FINDINGS: Gallbladder: There is an approximately 0.5 cm  echogenic shadowing stone within otherwise normal-appearing gallbladder. No gallbladder wall thickening or pericholecystic fluid. Negative sonographic Murphy's sign. Common bile duct: Diameter: Normal in size measuring 5 mm in diameter. Liver: Normal sonographic appearance of the liver. No discrete hepatic lesions. No intrahepatic biliary ductal dilatation. No ascites. IMPRESSION: 1. Cholelithiasis without evidence of cholecystitis. 2. No definite explanation for patient's elevated LFTs. Further evaluation with abdominal MRI could be performed as clinically indicated. Electronically Signed   By: Sandi Mariscal M.D.   On: 07/01/2017 11:10    Anti-infectives: Anti-infectives    Start     Dose/Rate Route Frequency Ordered Stop   07/01/17 0855  vancomycin (VANCOCIN) 1-5 GM/200ML-% IVPB    Comments:  Darlys Gales   : cabinet override      07/01/17 0855 07/01/17 0938   06/30/17 1000  vancomycin (VANCOCIN) IVPB 1000 mg/200 mL premix     1,000 mg 200 mL/hr over 60 Minutes Intravenous Every 12 hours 06/30/17 0904     06/29/17 1700  cefTRIAXone (ROCEPHIN) 1 g in dextrose 5 % 50 mL IVPB     1 g 100 mL/hr over 30 Minutes Intravenous Every 24 hours 06/29/17 1622     06/29/17 1200  piperacillin-tazobactam (ZOSYN) IVPB 3.375 g  Status:  Discontinued    Comments:  Pharmacy to check dosing   3.375 g 12.5 mL/hr over 240 Minutes Intravenous Every 8 hours 06/29/17 0939 06/29/17 1628  Assessment/Plan: 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  S/p washout 07/01/17.  Increased liver function tests - ?possibly related to chemo drugs;Appreciate GI consult.  Recheck LFTs tomorrow.   Tykerb on hold for now. Hepatitis panel negative RUQ ultrasound negative Wound infection - on Rocephin, will add VANC since MRSA swab positive and culture pending.  Apparently aerobic culture swab processed incorrectly  so will not be resulted.    Will need home health for dressing changes after discharge.  Plan d/c tomorrow.   LOS: 1 day    [facility] 07/02/2017

## 2017-07-03 ENCOUNTER — Telehealth: Payer: Self-pay

## 2017-07-03 ENCOUNTER — Encounter (HOSPITAL_COMMUNITY): Payer: Self-pay | Admitting: Surgery

## 2017-07-03 DIAGNOSIS — R7989 Other specified abnormal findings of blood chemistry: Secondary | ICD-10-CM

## 2017-07-03 DIAGNOSIS — R945 Abnormal results of liver function studies: Secondary | ICD-10-CM

## 2017-07-03 LAB — HEPATIC FUNCTION PANEL
ALT: 268 U/L — ABNORMAL HIGH (ref 14–54)
AST: 186 U/L — ABNORMAL HIGH (ref 15–41)
Albumin: 2.9 g/dL — ABNORMAL LOW (ref 3.5–5.0)
Alkaline Phosphatase: 431 U/L — ABNORMAL HIGH (ref 38–126)
Bilirubin, Direct: <0.1 mg/dL — ABNORMAL LOW (ref 0.1–0.5)
Total Bilirubin: 0.4 mg/dL (ref 0.3–1.2)
Total Protein: 6.1 g/dL — ABNORMAL LOW (ref 6.5–8.1)

## 2017-07-03 MED ORDER — DOXYCYCLINE HYCLATE 100 MG PO CAPS: 100.0000 mg | ORAL_CAPSULE | Freq: Two times a day (BID) | ORAL | 0 refills | Status: DC

## 2017-07-03 MED ORDER — LEVOFLOXACIN 500 MG PO TABS: 500.0000 mg | ORAL_TABLET | Freq: Every day | ORAL | 0 refills | Status: AC

## 2017-07-03 NOTE — Progress Notes (Signed)
Contacted Dr. [name] for [facility] order for wound care,  verbal order given.

## 2017-07-03 NOTE — Telephone Encounter (Signed)
Patient contacted.  She asked that I call her tomorrow to discuss and schedule her colonoscopy

## 2017-07-03 NOTE — Progress Notes (Signed)
      Patient discharged, about to leave. Feels okay. She will have repeat LFTs at our office in one week. Since discharge summary is done I will ask our office to call patient in a few days and remind her.    Tye Savoy ,NP 07/03/2017, 9:16 AM  Pager number [phone]  GI ATTENDING  As above. Suspect drug reaction. Primary GI MD Dr. Fuller Plan. I will send him and his nurse a note. Thanks   Docia Chuck. Geri Seminole., M.D. [facility] Division of Gastroenterology

## 2017-07-03 NOTE — Progress Notes (Signed)
Nurse aware to contact attending for [facility] order.

## 2017-07-03 NOTE — Telephone Encounter (Signed)
-----   Message from Willia Craze, NP sent at 07/03/2017  9:57 AM EDT ----- Eustaquio Maize,  Please call patient mid week and remind her to come in for LFTs next Monday or Tues. This is follow up labs from hospitalization. Please enter the order for LFTs. Dx : abnormal liver function studies.  Thanks

## 2017-07-03 NOTE — Discharge Instructions (Signed)
Daily wet to dry dressings to left chest wound - will arrange home health nursing to assist with dressing changes.  Once wound is clean enough, may consider wound VAC.  Change outer dressing as needed between dressing changes.  Take antibiotics until they are gone.  [facility] Stay Proper nutrition can help your body recover from illness and injury.   Foods and beverages high in protein, vitamins, and minerals help rebuild muscle loss, promote healing, & reduce fall risk.   In addition to eating healthy foods, a nutrition shake is an easy, delicious way to get the nutrition you need during and after your hospital stay  It is recommended that you continue to drink 2 bottles per day of:  Ensure Enlive for at least 1 month (30 days) after your hospital stay   Tips for adding a nutrition shake into your routine: As allowed, drink one with vitamins or medications instead of water or juice Enjoy one as a tasty mid-morning or afternoon snack Drink cold or make a milkshake out of it Drink one instead of milk with cereal or snacks Use as a coffee creamer   Available at the following grocery stores and pharmacies:           * [place] [phone]            For COUPONS visit: www.ensure.com/join or http://dawson-may.com/   Suggested Substitutions Ensure Plus = Boost Plus = Carnation Breakfast Essentials = Boost Compact Ensure Active Clear = Boost Breeze Glucerna Shake = Boost Glucose Control = Carnation Breakfast Essentials SUGAR FREE

## 2017-07-03 NOTE — Telephone Encounter (Signed)
-----   Message from Ladene Artist, MD sent at 07/03/2017 10:46 AM EDT ----- Regarding: RE: Follow up LFT's Rekha Hobbins, Pt had adenomatous colon polyp at colonoscopy in 05/2007. Please emphasize our recommendation for a surveillance colonoscopy. MS  ----- Message ----- From: Irene Shipper, MD Sent: 07/03/2017  10:23 AM To: Marlon Pel, RN, Ladene Artist, MD, # Subject: Follow up LFT's                                FYI. Seen in hospital for asymtomatic elevated LFT's. Suspect drug reaction. Follow up blood work at our office in one week being arranged by Nevin Bloodgood. Thanks  jp

## 2017-07-03 NOTE — Care Management Note (Signed)
Case Management Note  Patient Details  Name: [name] Riley MRN: [id number] Date of Birth: [birth year]  Subjective/Objective: 66 y/o f admitted w/Metastatic Ca. From home w/spouse. CM referral for [facility]. Provided w/HHC agency list-patient chose [facility]-rep Santiago Glad aware & accepted. HHRN-L breast wound care. No further CM needs.                   Action/Plan:d/c home w/HHC.   Expected Discharge Date:  07/03/17               Expected Discharge Plan:  [place]  In-House Referral:     Discharge planning Services  CM Consult  Post Acute Care Choice:    Choice offered to:     DME Arranged:    DME Agency:     HH Arranged:    [place] Agency:  [place]  Status of Service:  Completed, signed off  If discussed at [place] of Stay Meetings, dates discussed:    Additional Comments:  Dessa Phi, RN 07/03/2017, 9:47 AM

## 2017-07-03 NOTE — Discharge Summary (Signed)
Physician Discharge Summary  Patient ID: [name] Riley MRN: [id number] DOB/AGE: December 06, 1950 66 y.o.  Admit date: 06/29/2017 Discharge date: 07/03/2017  Admission Diagnoses:  Metastatic breast cancer   Left chest wound infection    Discharge Diagnoses:  Metastatic breast cancer   Left chest wound infection   Abnormal liver function tests - likely drug-related  Active Problems:   Metastatic breast cancer [facility])   Discharged Condition: good  Hospital Course: s/p excision of left chest wall malignant nodules on 06/22/17.  She returned to the office on 06/29/17 with obvious wound infection, widespread cellulitis of left chest wall, and generalized nausea and malaise.  The wound was opened in the office and we expressed some purulent drainage from the medial part of the incision. She was admitted to the hospital for IV antibiotics and symptoms control.  WBC was normal, but LFT's were significantly increased.  GI was consulted and felt that this was likely drug-induced.  We held her Tykerb and her LFT's have begun decreasing.  On 9/15, we went to the OR and opened the wound completely and washed it out thoroughly.  We began wet to dry dressings. She has been on Vanc and Rocephin.  Home health is being arranged and she is ready for discharge today.  Consults: GI  Significant Diagnostic Studies: labs:  Initial swab positive for MRSA.  Wound cultures pending.  Lab Results  Component Value Date   WBC 10.3 07/01/2017   HGB 9.7 (L) 07/01/2017   HCT 28.9 (L) 07/01/2017   MCV 90.6 07/01/2017   PLT 306 07/01/2017   Lab Results  Component Value Date   CREATININE 0.52 07/01/2017   BUN 9 07/01/2017   NA 136 07/01/2017   K 3.8 07/01/2017   CL 101 07/01/2017   CO2 25 07/01/2017   Lab Results  Component Value Date   ALT 268 (H) 07/03/2017   AST 186 (H) 07/03/2017   ALKPHOS 431 (H) 07/03/2017   BILITOT 0.4 07/03/2017     Treatments: surgery: irrigation and debridement of left chest  wound  Discharge Exam: Blood pressure (!) 155/72, pulse 78, temperature 98.9 F (37.2 C), temperature source Oral, resp. rate 16, height 5\' 8"  (1.727 m), weight 70.3 kg (155 lb), SpO2 98 %. WDWN in NAD Skin - no jaundice Lungs - cTA B CV - RRR Left chest - cellulitis much decreased; minimal surrounding erythema Wound - medial part still with some purulence, but lateral half is beginning to granulate  Disposition: 01-Home or Self Care Daily wet to dry dressings to left chest wound with HHRN.  When wound is clean enough, may consider switching to a VAC.   Discharge Instructions    Call MD for:  persistant nausea and vomiting    Complete by:  As directed    Call MD for:  severe uncontrolled pain    Complete by:  As directed    Call MD for:  temperature >100.4    Complete by:  As directed    Diet general    Complete by:  As directed    Driving Restrictions    Complete by:  As directed    Do not drive while taking pain medications   Increase activity slowly    Complete by:  As directed    May shower / Bathe    Complete by:  As directed      Allergies as of 07/03/2017      Reactions   Contrast Media [iodinated Diagnostic Agents] Shortness Of Breath,  Rash   Food Shortness Of Breath   Cantaloupe   Pravastatin Other (See Comments)   Legs hurt   Zosyn [piperacillin Sod-tazobactam So] Rash, Other (See Comments)   Temperature increase, facial flushing      Medication List    STOP taking these medications   cephALEXin 500 MG capsule Commonly known as:  KEFLEX   lapatinib 250 MG tablet Commonly known as:  TYKERB   predniSONE 50 MG tablet Commonly known as:  DELTASONE     TAKE these medications   acetaminophen 500 MG tablet Commonly known as:  TYLENOL Take 1,000 mg by mouth every 6 (six) hours as needed for moderate pain.   aspirin 81 MG tablet Take 81 mg by mouth daily.   doxycycline 100 MG capsule Commonly known as:  VIBRAMYCIN Take 1 capsule (100 mg total) by  mouth 2 (two) times daily.   ergocalciferol 50000 units capsule Commonly known as:  VITAMIN D2 Take 50,000 Units by mouth every Monday.   HYDROcodone-acetaminophen 5-325 MG tablet Commonly known as:  NORCO/VICODIN Take 1 tablet by mouth every 4 (four) hours as needed for moderate pain.   levofloxacin 500 MG tablet Commonly known as:  LEVAQUIN Take 1 tablet (500 mg total) by mouth daily.   levothyroxine 75 MCG tablet Commonly known as:  SYNTHROID, LEVOTHROID Take 75 mcg by mouth daily.   lidocaine-prilocaine cream Commonly known as:  EMLA Apply to affected area once What changed:  when to take this  reasons to take this  additional instructions   metoprolol succinate 100 MG 24 hr tablet Commonly known as:  TOPROL-XL Take 1 tablet (100 mg total) by mouth daily.   multivitamin with minerals tablet Take 1 tablet by mouth daily.   ondansetron 4 MG disintegrating tablet Commonly known as:  ZOFRAN-ODT Take 4 mg by mouth every 6 (six) hours as needed for nausea/vomiting.            Discharge Care Instructions        Start     Ordered   07/03/17 0000  Diet general     07/03/17 0759   07/03/17 0000  Increase activity slowly     07/03/17 0759   07/03/17 0000  May shower / Bathe     07/03/17 0759   07/03/17 0000  Driving Restrictions    Comments:  Do not drive while taking pain medications   07/03/17 0759   07/03/17 0000  Call MD for:  temperature >100.4     07/03/17 0759   07/03/17 0000  Call MD for:  persistant nausea and vomiting     07/03/17 0759   07/03/17 0000  Call MD for:  severe uncontrolled pain     07/03/17 0759   07/03/17 0000  doxycycline (VIBRAMYCIN) 100 MG capsule  2 times daily     07/03/17 0759   07/03/17 0000  levofloxacin (LEVAQUIN) 500 MG tablet  Daily     07/03/17 0759     Follow-up Information    Donnie Mesa, MD. Schedule an appointment as soon as possible for a visit in 10 day(s).   Specialty:  General Surgery Contact  information: [address] [place] [address] [phone]           Signed: Tadarius Maland K. 07/03/2017, 8:00 AM

## 2017-07-04 LAB — HEPATITIS B SURFACE ANTIGEN: Hepatitis B Surface Ag: NEGATIVE

## 2017-07-04 NOTE — Telephone Encounter (Signed)
Patient contacted this am.  She said she again could not talk.  She understands to call back when it is a convenient time for her to talk and schedule the colonoscopy. I will mail her a reminder letter

## 2017-07-05 ENCOUNTER — Telehealth: Payer: Self-pay

## 2017-07-05 ENCOUNTER — Other Ambulatory Visit: Payer: Self-pay

## 2017-07-05 DIAGNOSIS — R945 Abnormal results of liver function studies: Secondary | ICD-10-CM

## 2017-07-05 LAB — ANAEROBIC CULTURE

## 2017-07-05 LAB — AEROBIC CULTURE W GRAM STAIN (SUPERFICIAL SPECIMEN)

## 2017-07-05 LAB — AEROBIC CULTURE  (SUPERFICIAL SPECIMEN)

## 2017-07-05 NOTE — Telephone Encounter (Signed)
Pt that she got out of hospital on Monday and is feeling weak and nauseated. She has a [facility] provider coming daily to change her dressing. She does not feel she will be able to tolerate her CT and PET that are scheduled for Monday 9/24. She will need to cancel her echo as well b/c her dressing is over that area. She had a tumor removed from that area, with infection involved.  This RN did not cancel these appts until Dr Lindi Adie made aware.

## 2017-07-05 NOTE — Telephone Encounter (Signed)
Will follow up with pt.

## 2017-07-05 NOTE — Telephone Encounter (Signed)
Spoke with the spouse who says he is making arrangements to get her to lab on Monday or Tuesday.

## 2017-07-06 ENCOUNTER — Other Ambulatory Visit: Payer: Self-pay

## 2017-07-06 ENCOUNTER — Telehealth: Payer: Self-pay

## 2017-07-06 NOTE — Telephone Encounter (Signed)
Spoke with Patient about her wanting to reschedule her scans she has next week. Dr. Lindi Adie is aware and stated that was ok. Patient has central scheduling's number and is to call them to reschedule. She verbalized understanding. No further concerns.

## 2017-07-10 ENCOUNTER — Encounter (HOSPITAL_COMMUNITY): Payer: 59

## 2017-07-10 ENCOUNTER — Ambulatory Visit (HOSPITAL_COMMUNITY): Payer: 59

## 2017-07-10 ENCOUNTER — Other Ambulatory Visit (INDEPENDENT_AMBULATORY_CARE_PROVIDER_SITE_OTHER): Payer: 59

## 2017-07-10 DIAGNOSIS — R945 Abnormal results of liver function studies: Secondary | ICD-10-CM

## 2017-07-10 DIAGNOSIS — K7689 Other specified diseases of liver: Secondary | ICD-10-CM

## 2017-07-10 LAB — HEPATIC FUNCTION PANEL
ALT: 47 U/L — ABNORMAL HIGH (ref 0–35)
AST: 31 U/L (ref 0–37)
Albumin: 3.8 g/dL (ref 3.5–5.2)
Alkaline Phosphatase: 329 U/L — ABNORMAL HIGH (ref 39–117)
Bilirubin, Direct: 0 mg/dL (ref 0.0–0.3)
Total Bilirubin: 0.4 mg/dL (ref 0.2–1.2)
Total Protein: 7.3 g/dL (ref 6.0–8.3)

## 2017-07-13 ENCOUNTER — Other Ambulatory Visit: Payer: Self-pay

## 2017-07-13 ENCOUNTER — Other Ambulatory Visit: Payer: Managed Care, Other (non HMO)

## 2017-07-13 DIAGNOSIS — R945 Abnormal results of liver function studies: Principal | ICD-10-CM

## 2017-07-13 DIAGNOSIS — R7989 Other specified abnormal findings of blood chemistry: Secondary | ICD-10-CM

## 2017-07-14 ENCOUNTER — Telehealth: Payer: Self-pay

## 2017-07-14 NOTE — Telephone Encounter (Signed)
Patient is asking if she should still have treatment on Wed, Oct 3rd even thought she has not had ECHO, CT, or PET due to having to reschedule those due to [facility] on a chest wound. She is still not feeling up to having those scans done. Dr. Lindi Adie gone for the day. I have left note with May and informed the patient we will get back to her regarding this on Monday.  Cyndia Bent RN

## 2017-07-17 ENCOUNTER — Telehealth: Payer: Self-pay

## 2017-07-17 NOTE — Telephone Encounter (Signed)
Called pt to clarify that she can still come and get treatment on 07/19/17 and wait on getting scans until her chest wound improves. Told pt that it is okay with Dr.Gudena and he is aware.

## 2017-07-19 ENCOUNTER — Telehealth: Payer: Self-pay | Admitting: Hematology and Oncology

## 2017-07-19 ENCOUNTER — Other Ambulatory Visit (HOSPITAL_BASED_OUTPATIENT_CLINIC_OR_DEPARTMENT_OTHER): Payer: 59

## 2017-07-19 ENCOUNTER — Ambulatory Visit (HOSPITAL_BASED_OUTPATIENT_CLINIC_OR_DEPARTMENT_OTHER): Payer: 59 | Admitting: Hematology and Oncology

## 2017-07-19 ENCOUNTER — Ambulatory Visit (HOSPITAL_BASED_OUTPATIENT_CLINIC_OR_DEPARTMENT_OTHER): Payer: 59

## 2017-07-19 DIAGNOSIS — C7951 Secondary malignant neoplasm of bone: Secondary | ICD-10-CM

## 2017-07-19 DIAGNOSIS — C773 Secondary and unspecified malignant neoplasm of axilla and upper limb lymph nodes: Secondary | ICD-10-CM

## 2017-07-19 DIAGNOSIS — Z5111 Encounter for antineoplastic chemotherapy: Secondary | ICD-10-CM | POA: Diagnosis not present

## 2017-07-19 DIAGNOSIS — C50412 Malignant neoplasm of upper-outer quadrant of left female breast: Secondary | ICD-10-CM

## 2017-07-19 DIAGNOSIS — Z170 Estrogen receptor positive status [ER+]: Secondary | ICD-10-CM

## 2017-07-19 DIAGNOSIS — Z5112 Encounter for antineoplastic immunotherapy: Secondary | ICD-10-CM | POA: Diagnosis not present

## 2017-07-19 LAB — CBC WITH DIFFERENTIAL/PLATELET
BASO%: 0.6 % (ref 0.0–2.0)
Basophils Absolute: 0 10*3/uL (ref 0.0–0.1)
EOS%: 1.6 % (ref 0.0–7.0)
Eosinophils Absolute: 0.1 10*3/uL (ref 0.0–0.5)
HCT: 39.1 % (ref 34.8–46.6)
HGB: 13 g/dL (ref 11.6–15.9)
LYMPH%: 31.1 % (ref 14.0–49.7)
MCH: 30.4 pg (ref 25.1–34.0)
MCHC: 33.2 g/dL (ref 31.5–36.0)
MCV: 91.6 fL (ref 79.5–101.0)
MONO#: 0.9 10*3/uL (ref 0.1–0.9)
MONO%: 11.9 % (ref 0.0–14.0)
NEUT#: 4 10*3/uL (ref 1.5–6.5)
NEUT%: 54.8 % (ref 38.4–76.8)
Platelets: 339 10*3/uL (ref 145–400)
RBC: 4.26 10*6/uL (ref 3.70–5.45)
RDW: 13.7 % (ref 11.2–14.5)
WBC: 7.2 10*3/uL (ref 3.9–10.3)
lymph#: 2.3 10*3/uL (ref 0.9–3.3)

## 2017-07-19 LAB — COMPREHENSIVE METABOLIC PANEL
ALT: 33 U/L (ref 0–55)
AST: 33 U/L (ref 5–34)
Albumin: 3.9 g/dL (ref 3.5–5.0)
Alkaline Phosphatase: 316 U/L — ABNORMAL HIGH (ref 40–150)
Anion Gap: 12 mEq/L — ABNORMAL HIGH (ref 3–11)
BUN: 15.7 mg/dL (ref 7.0–26.0)
CO2: 27 mEq/L (ref 22–29)
Calcium: 10 mg/dL (ref 8.4–10.4)
Chloride: 100 mEq/L (ref 98–109)
Creatinine: 0.7 mg/dL (ref 0.6–1.1)
EGFR: 85 mL/min/{1.73_m2} — ABNORMAL LOW (ref >90)
Glucose: 92 mg/dl (ref 70–140)
Potassium: 3.6 mEq/L (ref 3.5–5.1)
Sodium: 139 mEq/L (ref 136–145)
Total Bilirubin: 0.44 mg/dL (ref 0.20–1.20)
Total Protein: 7.8 g/dL (ref 6.4–8.3)

## 2017-07-19 MED ORDER — ACETAMINOPHEN 325 MG PO TABS
650.0000 mg | ORAL_TABLET | Freq: Once | ORAL | Status: AC
  Administered 2017-07-19: 650 mg via ORAL

## 2017-07-19 MED ORDER — TRASTUZUMAB CHEMO INJECTION 440 MG
450.0000 mg | Freq: Once | INTRAVENOUS | Status: AC
  Administered 2017-07-19: 450 mg via INTRAVENOUS
  Filled 2017-07-19: qty 21.43

## 2017-07-19 MED ORDER — ACETAMINOPHEN 325 MG PO TABS
ORAL_TABLET | ORAL | Status: AC
  Filled 2017-07-19: qty 2

## 2017-07-19 MED ORDER — DIPHENHYDRAMINE HCL 25 MG PO CAPS
ORAL_CAPSULE | ORAL | Status: AC
  Filled 2017-07-19: qty 2

## 2017-07-19 MED ORDER — HEPARIN SOD (PORK) LOCK FLUSH 100 UNIT/ML IV SOLN
500.0000 [IU] | Freq: Once | INTRAVENOUS | Status: AC | PRN
  Administered 2017-07-19: 500 [IU]
  Filled 2017-07-19: qty 5

## 2017-07-19 MED ORDER — ZOLEDRONIC ACID 4 MG/100ML IV SOLN
4.0000 mg | Freq: Once | INTRAVENOUS | Status: AC
  Administered 2017-07-19: 4 mg via INTRAVENOUS
  Filled 2017-07-19: qty 100

## 2017-07-19 MED ORDER — SODIUM CHLORIDE 0.9% FLUSH
10.0000 mL | INTRAVENOUS | Status: DC | PRN
  Administered 2017-07-19: 10 mL
  Filled 2017-07-19: qty 10

## 2017-07-19 MED ORDER — FULVESTRANT 250 MG/5ML IM SOLN
500.0000 mg | INTRAMUSCULAR | Status: DC
  Administered 2017-07-19: 500 mg via INTRAMUSCULAR
  Filled 2017-07-19: qty 10

## 2017-07-19 MED ORDER — SODIUM CHLORIDE 0.9 % IV SOLN
Freq: Once | INTRAVENOUS | Status: AC
  Administered 2017-07-19: 10:00:00 via INTRAVENOUS

## 2017-07-19 MED ORDER — DIPHENHYDRAMINE HCL 25 MG PO CAPS
50.0000 mg | ORAL_CAPSULE | Freq: Once | ORAL | Status: AC
  Administered 2017-07-19: 50 mg via ORAL

## 2017-07-19 NOTE — Assessment & Plan Note (Signed)
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 Was on Arimidex since 08/22/2012 to 03/30/16  Subcutaneous nodule excisionleft chest: Infiltrating carcinoma breast primary, ER positive, PR negative, HER-2 positive  CT CAP and bone scan06/13/2017: Lytic lesions T8 vertebral, T1 posterior element, subcutaneous nodule left lateral chest wall, nonspecific lung nodules; Bone scan: Mets to kull, left humerus, left eighth rib, T7/T8, sternum, left acetabulum. (In retrospect, original breast MRI in 2002 revealed sternal lesions concerning for metastatic disease, PET CT scan did not reveal metastases)  009/03/2017:Left chest wall nodule excision recurrent breast cancer  Treatment Plan: Herceptin Q 4 weeks, Faslodex, Zometa, started 04/28/2016 ( lapatinib discontinued because of elevated LFTs)  Toxicities: 1. Diarrhea from lapatinib, discontinued  Due to elevated LFTs 2. Injection site discomfort from Faslodex 3. Hospitalization for left chest wall cellulitis and infection ---------------------------------------------------- Return to clinic in one month after undergoing CT chest abdomen and pelvis  And bone scan.

## 2017-07-19 NOTE — Progress Notes (Signed)
Per May, RN with Dr. Lindi Adie, ok to treat with last echo in April.

## 2017-07-19 NOTE — Progress Notes (Signed)
Patient Care Team: [name], MD as PCP - General (Family Medicine)  DIAGNOSIS:  Encounter Diagnosis  Name Primary?  . Malignant neoplasm of upper-outer quadrant of left breast in female, estrogen receptor positive ([place])     SUMMARY OF ONCOLOGIC HISTORY:   Breast cancer of upper-outer quadrant of left female breast ([place])   11/14/2011 Surgery    Bilateral mastectomy, prophylactic on the right, left breast IDC 3/18 lymph nodes positive with extracapsular extension ER 89%, PR 81%, HER-2 negative, Ki-67 79% T2 N1 A. stage IIB      12/13/2011 - 06/28/2012 Chemotherapy    4 cycles of FEC followed by 4 cycles of Taxotere      07/17/2012 - 08/22/2012 Radiation Therapy    Adjuvant radiation therapy      08/22/2012 - 03/16/2016 Anti-estrogen oral therapy    Arimidex 1 mg daily      03/16/2016 Relapse/Recurrence    Subcutaneous nodule excision left chest: Infiltrating carcinoma breast primary, ER positive, PR negative      03/29/2016 Imaging    CT CAP and bone scan: Lytic lesions T8 vertebral, T1 posterior element, subcutaneous nodule left lateral chest wall, nonspecific lung nodules; Bone scan: Mets to kull, left humerus, left eighth rib, T7/T8, sternum, left acetabulum      04/28/2016 -  Chemotherapy    Herceptin, lapatinib, Faslodex, Zometa every 4 weeks      06/22/2017 Relapse/Recurrence    Surgical excision:Soft tissue mass left lateral chest wall primary breast cancer, soft tissue mass left medial chest wall breast cancer, tumor is within the dermis extending to the subcutaneous adipose tissue and involves portions of skeletal muscle       CHIEF COMPLIANT: Follow-up after recent hospitalization for cellulitis and excision of recurrent breast cancer  INTERVAL HISTORY: [name] Riley is a 66 year old with above-mentioned history metastatic breast cancer who was on Herceptin along with lapatinib and Faslodex.he was noted to have elevated LFTs in the hospital and was told to stop  lapatinib. Subsequently her LFTs have come back down. The infection appears to be healing and she still requires wound changes twice a week. Since we stopped the lapatinib her diarrhea has resolved.  REVIEW OF SYSTEMS:   Constitutional: Denies fevers, chills or abnormal weight loss Eyes: Denies blurriness of vision Ears, nose, mouth, throat, and face: Denies mucositis or sore throat Respiratory: Denies cough, dyspnea or wheezes, left chest wall healing wound Cardiovascular: Denies palpitation, chest discomfort Gastrointestinal:  Denies nausea, heartburn or change in bowel habits Skin: Denies abnormal skin rashes Lymphatics: Denies new lymphadenopathy or easy bruising Neurological:Denies numbness, tingling or new weaknesses Behavioral/Psych: Mood is stable, no new changes  Extremities: No lower extremity edema All other systems were reviewed with the patient and are negative.  I have reviewed the past medical history, past surgical history, social history and family history with the patient and they are unchanged from previous note.  ALLERGIES:  is allergic to contrast media [iodinated diagnostic agents]; food; pravastatin; and zosyn [piperacillin sod-tazobactam so].  MEDICATIONS:  Current Outpatient Prescriptions  Medication Sig Dispense Refill  . acetaminophen (TYLENOL) 500 MG tablet Take 1,000 mg by mouth every 6 (six) hours as needed for moderate pain.    Marland Kitchen aspirin 81 MG tablet Take 81 mg by mouth daily.      . ergocalciferol (VITAMIN D2) 50000 units capsule Take 50,000 Units by mouth every Monday.     . levothyroxine (SYNTHROID, LEVOTHROID) 75 MCG tablet Take 75 mcg by mouth daily.     Marland Kitchen  lidocaine-prilocaine (EMLA) cream Apply to affected area once (Patient taking differently: as needed (for port access). Apply to affected area once) 30 g 3  . metoprolol succinate (TOPROL-XL) 100 MG 24 hr tablet Take 1 tablet (100 mg total) by mouth daily. 90 tablet 3  . Multiple Vitamins-Minerals  (MULTIVITAMIN WITH MINERALS) tablet Take 1 tablet by mouth daily.      . ondansetron (ZOFRAN-ODT) 4 MG disintegrating tablet Take 4 mg by mouth every 6 (six) hours as needed for nausea/vomiting.     No current facility-administered medications for this visit.    Facility-Administered Medications Ordered in Other Visits  Medication Dose Route Frequency Provider Last Rate Last Dose  . 0.9 %  sodium chloride infusion   Intravenous Continuous Donnie Mesa, MD      . acetaminophen (TYLENOL) tablet 975 mg  975 mg Oral Q6H Donnie Mesa, MD      . enoxaparin (LOVENOX) injection 40 mg  40 mg Subcutaneous Q24H Donnie Mesa, MD      . fulvestrant (FASLODEX) injection 500 mg  500 mg Intramuscular Q30 days Nicholas Lose, MD      . heparin lock flush 100 unit/mL  500 Units Intracatheter Once PRN Nicholas Lose, MD      . morphine 4 MG/ML injection 2-4 mg  2-4 mg Intravenous Q2H PRN Donnie Mesa, MD      . ondansetron (ZOFRAN-ODT) disintegrating tablet 4 mg  4 mg Oral Q6H PRN Donnie Mesa, MD       Or  . ondansetron (ZOFRAN) 4 mg in sodium chloride 0.9 % 50 mL IVPB  4 mg Intravenous Q6H PRN Donnie Mesa, MD      . oxyCODONE (Oxy IR/ROXICODONE) immediate release tablet 5-10 mg  5-10 mg Oral Q4H PRN Donnie Mesa, MD      . piperacillin-tazobactam (ZOSYN) 3.375 g in dextrose 5 % 50 mL IVPB  3.375 g Intravenous Q8H Donnie Mesa, MD      . prochlorperazine (COMPAZINE) tablet 10 mg  10 mg Oral Q6H PRN Donnie Mesa, MD       Or  . prochlorperazine (COMPAZINE) injection 5-10 mg  5-10 mg Intravenous Q6H PRN Donnie Mesa, MD      . sodium chloride flush (NS) 0.9 % injection 10 mL  10 mL Intracatheter PRN Nicholas Lose, MD      . trastuzumab (HERCEPTIN) 450 mg in sodium chloride 0.9 % 250 mL chemo infusion  450 mg Intravenous Once Nicholas Lose, MD 542.9 mL/hr at 07/19/17 1045 450 mg at 07/19/17 1045    PHYSICAL EXAMINATION: ECOG PERFORMANCE STATUS: 1 - Symptomatic but completely  ambulatory  Vitals:   07/19/17 0838  BP: (!) 158/102  Pulse: 82  Resp: 18  Temp: 98.7 F (37.1 C)  SpO2: 100%   Filed Weights   07/19/17 0838  Weight: 149 lb 9.6 oz (67.9 kg)    GENERAL:alert, no distress and comfortable SKIN: skin color, texture, turgor are normal, no rashes or significant lesions EYES: normal, Conjunctiva are pink and non-injected, sclera clear OROPHARYNX:no exudate, no erythema and lips, buccal mucosa, and tongue normal  NECK: supple, thyroid normal size, non-tender, without nodularity LYMPH:  no palpable lymphadenopathy in the cervical, axillary or inguinal LUNGS: clear to auscultation and percussion with normal breathing effort HEART: regular rate & rhythm and no murmurs and no lower extremity edema ABDOMEN:abdomen soft, non-tender and normal bowel sounds MUSCULOSKELETAL:no cyanosis of digits and no clubbing  NEURO: alert & oriented x 3 with fluent speech, no focal motor/sensory deficits EXTREMITIES:  No lower extremity edema BREAST:lleft chest wall surgical wound (exam performed in the presence of a chaperone)  LABORATORY DATA:  I have reviewed the data as listed   Chemistry      Component Value Date/Time   NA 139 07/19/2017 0818   K 3.6 07/19/2017 0818   CL 101 07/01/2017 2347   CL 105 08/10/2012 0908   CO2 27 07/19/2017 0818   BUN 15.7 07/19/2017 0818   CREATININE 0.7 07/19/2017 0818      Component Value Date/Time   CALCIUM 10.0 07/19/2017 0818   ALKPHOS 316 (H) 07/19/2017 0818   AST 33 07/19/2017 0818   ALT 33 07/19/2017 0818   BILITOT 0.44 07/19/2017 0818       Lab Results  Component Value Date   WBC 7.2 07/19/2017   HGB 13.0 07/19/2017   HCT 39.1 07/19/2017   MCV 91.6 07/19/2017   PLT 339 07/19/2017   NEUTROABS 4.0 07/19/2017    ASSESSMENT & PLAN:  Breast cancer of upper-outer quadrant of left female breast ([place]) Left breast invasive ductal carcinomaT2, N1, M0 stage IIB 3 of 18 lymph nodes positive with extracapsular  extension ER 89% PR 81% HER-2 negative Ki-67 79% status post 4 cycles of FEC and 4 cycles of Taxotere and adjuvant radiation. Was on Arimidex since 08/22/2012 to 03/30/16  Subcutaneous nodule excisionleft chest: Infiltrating carcinoma breast primary, ER positive, PR negative, HER-2 positive  CT CAP and bone scan06/13/2017: Lytic lesions T8 vertebral, T1 posterior element, subcutaneous nodule left lateral chest wall, nonspecific lung nodules; Bone scan: Mets to kull, left humerus, left eighth rib, T7/T8, sternum, left acetabulum. (In retrospect, original breast MRI in 2002 revealed sternal lesions concerning for metastatic disease, PET CT scan did not reveal metastases)  009/03/2017:Left chest wall nodule excision recurrent breast cancer  Treatment Plan: Herceptin Q 4 weeks, Faslodex, Zometa, started 04/28/2016 ( lapatinib discontinued because of elevated LFTs)  Toxicities: 1. Diarrhea from lapatinib, discontinued  Due to elevated LFTs 2. Injection site discomfort from Faslodex 3. Hospitalization for left chest wall cellulitis and infection ---------------------------------------------------- Return to clinic in one month after undergoing CT chest abdomen and pelvis  And bone scan.   I spent 25 minutes talking to the patient of which more than half was spent in counseling and coordination of care.  No orders of the defined types were placed in this encounter.  The patient has a good understanding of the overall plan. she agrees with it. she will call with any problems that may develop before the next visit here.   Rulon Eisenmenger, MD 07/19/17

## 2017-07-19 NOTE — Telephone Encounter (Signed)
Gave patient avs and calendar with appts per 10/3 los °

## 2017-07-19 NOTE — Patient Instructions (Signed)
[facility] Discharge Instructions for Patients Receiving Chemotherapy  Today you received the following chemotherapy agents: Herceptin  To help prevent nausea and vomiting after your treatment, we encourage you to take your nausea medication as directed.    If you develop nausea and vomiting that is not controlled by your nausea medication, call the clinic.   BELOW ARE SYMPTOMS THAT SHOULD BE REPORTED IMMEDIATELY:  *FEVER GREATER THAN 100.5 F  *CHILLS WITH OR WITHOUT FEVER  NAUSEA AND VOMITING THAT IS NOT CONTROLLED WITH YOUR NAUSEA MEDICATION  *UNUSUAL SHORTNESS OF BREATH  *UNUSUAL BRUISING OR BLEEDING  TENDERNESS IN MOUTH AND THROAT WITH OR WITHOUT PRESENCE OF ULCERS  *URINARY PROBLEMS  *BOWEL PROBLEMS  UNUSUAL RASH Items with * indicate a potential emergency and should be followed up as soon as possible.  Feel free to call the clinic you have any questions or concerns. The clinic phone number is [phone].  Please show the [facility] at check-in to the Emergency Department and triage nurse.  Zoledronic Acid injection (Hypercalcemia, Oncology) What is this medicine? ZOLEDRONIC ACID (ZOE le dron ik AS id) lowers the amount of calcium loss from bone. It is used to treat too much calcium in your blood from cancer. It is also used to prevent complications of cancer that has spread to the bone. This medicine may be used for other purposes; ask your health care provider or pharmacist if you have questions. COMMON BRAND NAME(S): Zometa What should I tell my health care provider before I take this medicine? They need to know if you have any of these conditions: -aspirin-sensitive asthma -cancer, especially if you are receiving medicines used to treat cancer -dental disease or wear dentures -infection -kidney disease -receiving corticosteroids like dexamethasone or prednisone -an unusual or allergic reaction to zoledronic acid, other medicines, foods,  dyes, or preservatives -pregnant or trying to get pregnant -breast-feeding How should I use this medicine? This medicine is for infusion into a vein. It is given by a health care professional in a hospital or clinic setting. Talk to your pediatrician regarding the use of this medicine in children. Special care may be needed. Overdosage: If you think you have taken too much of this medicine contact a poison control center or emergency room at once. NOTE: This medicine is only for you. Do not share this medicine with others. What if I miss a dose? It is important not to miss your dose. Call your doctor or health care professional if you are unable to keep an appointment. What may interact with this medicine? -certain antibiotics given by injection -NSAIDs, medicines for pain and inflammation, like ibuprofen or naproxen -some diuretics like bumetanide, furosemide -teriparatide -thalidomide This list may not describe all possible interactions. Give your health care provider a list of all the medicines, herbs, non-prescription drugs, or dietary supplements you use. Also tell them if you smoke, drink alcohol, or use illegal drugs. Some items may interact with your medicine. What should I watch for while using this medicine? Visit your doctor or health care professional for regular checkups. It may be some time before you see the benefit from this medicine. Do not stop taking your medicine unless your doctor tells you to. Your doctor may order blood tests or other tests to see how you are doing. Women should inform their doctor if they wish to become pregnant or think they might be pregnant. There is a potential for serious side effects to an unborn child. Talk to  your health care professional or pharmacist for more information. You should make sure that you get enough calcium and vitamin D while you are taking this medicine. Discuss the foods you eat and the vitamins you take with your health care  professional. Some people who take this medicine have severe bone, joint, and/or muscle pain. This medicine may also increase your risk for jaw problems or a broken thigh bone. Tell your doctor right away if you have severe pain in your jaw, bones, joints, or muscles. Tell your doctor if you have any pain that does not go away or that gets worse. Tell your dentist and dental surgeon that you are taking this medicine. You should not have major dental surgery while on this medicine. See your dentist to have a dental exam and fix any dental problems before starting this medicine. Take good care of your teeth while on this medicine. Make sure you see your dentist for regular follow-up appointments. What side effects may I notice from receiving this medicine? Side effects that you should report to your doctor or health care professional as soon as possible: -allergic reactions like skin rash, itching or hives, swelling of the face, lips, or tongue -anxiety, confusion, or depression -breathing problems -changes in vision -eye pain -feeling faint or lightheaded, falls -jaw pain, especially after dental work -mouth sores -muscle cramps, stiffness, or weakness -redness, blistering, peeling or loosening of the skin, including inside the mouth -trouble passing urine or change in the amount of urine Side effects that usually do not require medical attention (report to your doctor or health care professional if they continue or are bothersome): -bone, joint, or muscle pain -constipation -diarrhea -fever -hair loss -irritation at site where injected -loss of appetite -nausea, vomiting -stomach upset -trouble sleeping -trouble swallowing -weak or tired This list may not describe all possible side effects. Call your doctor for medical advice about side effects. You may report side effects to FDA at 1-800-FDA-1088. Where should I keep my medicine? This drug is given in a hospital or clinic and will not  be stored at home. NOTE: This sheet is a summary. It may not cover all possible information. If you have questions about this medicine, talk to your doctor, pharmacist, or health care provider.  2018 Elsevier/Gold Standard (2014-03-01 14:19:39)  Fulvestrant injection What is this medicine? FULVESTRANT (ful VES trant) blocks the effects of estrogen. It is used to treat breast cancer. This medicine may be used for other purposes; ask your health care provider or pharmacist if you have questions. COMMON BRAND NAME(S): FASLODEX What should I tell my health care provider before I take this medicine? They need to know if you have any of these conditions: -bleeding problems -liver disease -low levels of platelets in the blood -an unusual or allergic reaction to fulvestrant, other medicines, foods, dyes, or preservatives -pregnant or trying to get pregnant -breast-feeding How should I use this medicine? This medicine is for injection into a muscle. It is usually given by a health care professional in a hospital or clinic setting. Talk to your pediatrician regarding the use of this medicine in children. Special care may be needed. Overdosage: If you think you have taken too much of this medicine contact a poison control center or emergency room at once. NOTE: This medicine is only for you. Do not share this medicine with others. What if I miss a dose? It is important not to miss your dose. Call your doctor or health care professional if  you are unable to keep an appointment. What may interact with this medicine? -medicines that treat or prevent blood clots like warfarin, enoxaparin, and dalteparin This list may not describe all possible interactions. Give your health care provider a list of all the medicines, herbs, non-prescription drugs, or dietary supplements you use. Also tell them if you smoke, drink alcohol, or use illegal drugs. Some items may interact with your medicine. What should I  watch for while using this medicine? Your condition will be monitored carefully while you are receiving this medicine. You will need important blood work done while you are taking this medicine. Do not become pregnant while taking this medicine or for at least 1 year after stopping it. Women of child-bearing potential will need to have a negative pregnancy test before starting this medicine. Women should inform their doctor if they wish to become pregnant or think they might be pregnant. There is a potential for serious side effects to an unborn child. Men should inform their doctors if they wish to father a child. This medicine may lower sperm counts. Talk to your health care professional or pharmacist for more information. Do not breast-feed an infant while taking this medicine or for 1 year after the last dose. What side effects may I notice from receiving this medicine? Side effects that you should report to your doctor or health care professional as soon as possible: -allergic reactions like skin rash, itching or hives, swelling of the face, lips, or tongue -feeling faint or lightheaded, falls -pain, tingling, numbness, or weakness in the legs -signs and symptoms of infection like fever or chills; cough; flu-like symptoms; sore throat -vaginal bleeding Side effects that usually do not require medical attention (report to your doctor or health care professional if they continue or are bothersome): -aches, pains -constipation -diarrhea -headache -hot flashes -nausea, vomiting -pain at site where injected -stomach pain This list may not describe all possible side effects. Call your doctor for medical advice about side effects. You may report side effects to FDA at 1-800-FDA-1088. Where should I keep my medicine? This drug is given in a hospital or clinic and will not be stored at home. NOTE: This sheet is a summary. It may not cover all possible information. If you have questions about this  medicine, talk to your doctor, pharmacist, or health care provider.  2018 Elsevier/Gold Standard (2015-05-01 11:03:55)

## 2017-08-11 ENCOUNTER — Ambulatory Visit (HOSPITAL_COMMUNITY)
Admission: RE | Admit: 2017-08-11 | Discharge: 2017-08-11 | Disposition: A | Discharge status: Home / Self Care | Payer: 59 | Source: Ambulatory Visit | Attending: Hematology and Oncology | Admitting: Hematology and Oncology

## 2017-08-11 ENCOUNTER — Encounter (HOSPITAL_COMMUNITY)
Admission: RE | Admit: 2017-08-11 | Discharge: 2017-08-11 | Disposition: A | Discharge status: Home / Self Care | Payer: 59 | Source: Ambulatory Visit | Attending: Hematology and Oncology | Admitting: Hematology and Oncology

## 2017-08-11 DIAGNOSIS — C7951 Secondary malignant neoplasm of bone: Secondary | ICD-10-CM | POA: Diagnosis not present

## 2017-08-11 DIAGNOSIS — Z170 Estrogen receptor positive status [ER+]: Secondary | ICD-10-CM | POA: Insufficient documentation

## 2017-08-11 DIAGNOSIS — C50412 Malignant neoplasm of upper-outer quadrant of left female breast: Secondary | ICD-10-CM

## 2017-08-11 DIAGNOSIS — Z9013 Acquired absence of bilateral breasts and nipples: Secondary | ICD-10-CM | POA: Insufficient documentation

## 2017-08-11 MED ORDER — IOPAMIDOL (ISOVUE-300) INJECTION 61%
100.0000 mL | Freq: Once | INTRAVENOUS | Status: AC | PRN
  Administered 2017-08-11: 100 mL via INTRAVENOUS

## 2017-08-11 MED ORDER — TECHNETIUM TC 99M MEDRONATE IV KIT: 25.0000 | PACK | Freq: Once | INTRAVENOUS | Status: DC | PRN

## 2017-08-11 MED ORDER — IOPAMIDOL (ISOVUE-300) INJECTION 61%
INTRAVENOUS | Status: AC
  Filled 2017-08-11: qty 100

## 2017-08-16 ENCOUNTER — Ambulatory Visit: Payer: Managed Care, Other (non HMO)

## 2017-08-16 NOTE — Assessment & Plan Note (Signed)
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 Was on Arimidex since 08/22/2012 to 03/30/16  Subcutaneous nodule excisionleft chest: Infiltrating carcinoma breast primary, ER positive, PR negative, HER-2 positive  009/03/2017:Left chest wall nodule excision recurrent breast cancer  Treatment Plan: Herceptin Q 4 weeks, Faslodex, Zometa, started 04/28/2016 ( lapatinib discontinued because of elevated LFTs)  Toxicities: 1. Diarrhea from lapatinib, discontinued  Due to elevated LFTs 2. Injection site discomfort from Faslodex 3. Hospitalization for left chest wall cellulitis and infection ---------------------------------------------------- CT CAP: 08/11/17: Open wound. Diffuse bone mets. Bone Scan 08/11/17: Progression. Left femur mets are new. Existing lesions show inc activity  Plan: recommend adding either Perjeta or changing therapy to [facility]

## 2017-08-17 ENCOUNTER — Other Ambulatory Visit: Payer: Self-pay

## 2017-08-17 ENCOUNTER — Ambulatory Visit (HOSPITAL_BASED_OUTPATIENT_CLINIC_OR_DEPARTMENT_OTHER): Payer: 59

## 2017-08-17 ENCOUNTER — Telehealth: Payer: Self-pay

## 2017-08-17 ENCOUNTER — Other Ambulatory Visit (HOSPITAL_BASED_OUTPATIENT_CLINIC_OR_DEPARTMENT_OTHER): Payer: 59

## 2017-08-17 ENCOUNTER — Ambulatory Visit (HOSPITAL_BASED_OUTPATIENT_CLINIC_OR_DEPARTMENT_OTHER): Payer: 59 | Admitting: Hematology and Oncology

## 2017-08-17 DIAGNOSIS — C50412 Malignant neoplasm of upper-outer quadrant of left female breast: Secondary | ICD-10-CM

## 2017-08-17 DIAGNOSIS — Z170 Estrogen receptor positive status [ER+]: Secondary | ICD-10-CM

## 2017-08-17 DIAGNOSIS — Z79899 Other long term (current) drug therapy: Principal | ICD-10-CM

## 2017-08-17 DIAGNOSIS — C773 Secondary and unspecified malignant neoplasm of axilla and upper limb lymph nodes: Secondary | ICD-10-CM

## 2017-08-17 DIAGNOSIS — B029 Zoster without complications: Secondary | ICD-10-CM

## 2017-08-17 DIAGNOSIS — C7951 Secondary malignant neoplasm of bone: Secondary | ICD-10-CM

## 2017-08-17 DIAGNOSIS — R5383 Other fatigue: Secondary | ICD-10-CM | POA: Diagnosis not present

## 2017-08-17 DIAGNOSIS — Z5111 Encounter for antineoplastic chemotherapy: Secondary | ICD-10-CM

## 2017-08-17 DIAGNOSIS — R531 Weakness: Secondary | ICD-10-CM | POA: Diagnosis not present

## 2017-08-17 DIAGNOSIS — Z5181 Encounter for therapeutic drug level monitoring: Secondary | ICD-10-CM

## 2017-08-17 DIAGNOSIS — C792 Secondary malignant neoplasm of skin: Secondary | ICD-10-CM

## 2017-08-17 DIAGNOSIS — Z5112 Encounter for antineoplastic immunotherapy: Secondary | ICD-10-CM

## 2017-08-17 LAB — CBC WITH DIFFERENTIAL/PLATELET
BASO%: 0.3 % (ref 0.0–2.0)
Basophils Absolute: 0 10*3/uL (ref 0.0–0.1)
EOS%: 0.5 % (ref 0.0–7.0)
Eosinophils Absolute: 0 10*3/uL (ref 0.0–0.5)
HCT: 37.6 % (ref 34.8–46.6)
HGB: 12.8 g/dL (ref 11.6–15.9)
LYMPH%: 28.1 % (ref 14.0–49.7)
MCH: 30.6 pg (ref 25.1–34.0)
MCHC: 34.1 g/dL (ref 31.5–36.0)
MCV: 89.6 fL (ref 79.5–101.0)
MONO#: 0.5 10*3/uL (ref 0.1–0.9)
MONO%: 8.1 % (ref 0.0–14.0)
NEUT#: 4.1 10*3/uL (ref 1.5–6.5)
NEUT%: 63 % (ref 38.4–76.8)
Platelets: 301 10*3/uL (ref 145–400)
RBC: 4.2 10*6/uL (ref 3.70–5.45)
RDW: 14.2 % (ref 11.2–14.5)
WBC: 6.5 10*3/uL (ref 3.9–10.3)
lymph#: 1.8 10*3/uL (ref 0.9–3.3)

## 2017-08-17 LAB — COMPREHENSIVE METABOLIC PANEL
ALT: 27 U/L (ref 0–55)
AST: 29 U/L (ref 5–34)
Albumin: 4.3 g/dL (ref 3.5–5.0)
Alkaline Phosphatase: 216 U/L — ABNORMAL HIGH (ref 40–150)
Anion Gap: 9 mEq/L (ref 3–11)
BUN: 9.3 mg/dL (ref 7.0–26.0)
CO2: 26 mEq/L (ref 22–29)
Calcium: 9.1 mg/dL (ref 8.4–10.4)
Chloride: 101 mEq/L (ref 98–109)
Creatinine: 0.6 mg/dL (ref 0.6–1.1)
EGFR: >60 mL/min/{1.73_m2} (ref >60)
Glucose: 117 mg/dl (ref 70–140)
Potassium: 3.7 mEq/L (ref 3.5–5.1)
Sodium: 136 mEq/L (ref 136–145)
Total Bilirubin: 0.62 mg/dL (ref 0.20–1.20)
Total Protein: 7.7 g/dL (ref 6.4–8.3)

## 2017-08-17 MED ORDER — SODIUM CHLORIDE 0.9 % IV SOLN
Freq: Once | INTRAVENOUS | Status: DC
  Administered 2017-08-17: 10:00:00 via INTRAVENOUS

## 2017-08-17 MED ORDER — HEPARIN SOD (PORK) LOCK FLUSH 100 UNIT/ML IV SOLN
500.0000 [IU] | Freq: Once | INTRAVENOUS | Status: AC | PRN
  Administered 2017-08-17: 500 [IU]
  Filled 2017-08-17: qty 5

## 2017-08-17 MED ORDER — ZOLEDRONIC ACID 4 MG/100ML IV SOLN
4.0000 mg | Freq: Once | INTRAVENOUS | Status: AC
Start: 2017-08-17 — End: 2017-08-17
  Administered 2017-08-17: 4 mg via INTRAVENOUS
  Filled 2017-08-17: qty 100

## 2017-08-17 MED ORDER — SODIUM CHLORIDE 0.9% FLUSH
10.0000 mL | INTRAVENOUS | Status: DC | PRN
  Administered 2017-08-17: 10 mL
  Filled 2017-08-17: qty 10

## 2017-08-17 MED ORDER — VALACYCLOVIR HCL 1 G PO TABS: 1000.0000 mg | ORAL_TABLET | Freq: Two times a day (BID) | ORAL | 1 refills | Status: DC

## 2017-08-17 MED ORDER — ONDANSETRON HCL 4 MG/2ML IJ SOLN
INTRAMUSCULAR | Status: AC
  Filled 2017-08-17: qty 4

## 2017-08-17 MED ORDER — OXYCODONE-ACETAMINOPHEN 5-325 MG PO TABS: 1.0000 | ORAL_TABLET | Freq: Once | ORAL | Status: DC

## 2017-08-17 MED ORDER — SODIUM CHLORIDE 0.9 % IV SOLN: Freq: Once | INTRAVENOUS | Status: AC

## 2017-08-17 MED ORDER — ONDANSETRON HCL 4 MG/2ML IJ SOLN
8.0000 mg | Freq: Once | INTRAMUSCULAR | Status: DC
  Administered 2017-08-17: 8 mg via INTRAVENOUS

## 2017-08-17 MED ORDER — ONDANSETRON 4 MG PO TBDP: 4.0000 mg | ORAL_TABLET | Freq: Four times a day (QID) | ORAL | 3 refills | Status: DC | PRN

## 2017-08-17 MED ORDER — DIPHENHYDRAMINE HCL 25 MG PO CAPS
ORAL_CAPSULE | ORAL | Status: AC
  Filled 2017-08-17: qty 1

## 2017-08-17 MED ORDER — TRASTUZUMAB CHEMO INJECTION 440 MG
450.0000 mg | Freq: Once | INTRAVENOUS | Status: AC
  Administered 2017-08-17: 450 mg via INTRAVENOUS
  Filled 2017-08-17: qty 21.43

## 2017-08-17 MED ORDER — ACETAMINOPHEN 325 MG PO TABS
650.0000 mg | ORAL_TABLET | Freq: Once | ORAL | Status: AC
  Administered 2017-08-17: 650 mg via ORAL

## 2017-08-17 MED ORDER — DIPHENHYDRAMINE HCL 25 MG PO CAPS
50.0000 mg | ORAL_CAPSULE | Freq: Once | ORAL | Status: AC
  Administered 2017-08-17: 50 mg via ORAL

## 2017-08-17 MED ORDER — FULVESTRANT 250 MG/5ML IM SOLN
500.0000 mg | INTRAMUSCULAR | Status: DC
  Administered 2017-08-17: 500 mg via INTRAMUSCULAR
  Filled 2017-08-17: qty 10

## 2017-08-17 MED ORDER — SODIUM CHLORIDE 0.9 % IV SOLN: Freq: Once | INTRAVENOUS | Status: DC

## 2017-08-17 MED ORDER — ACETAMINOPHEN 325 MG PO TABS
ORAL_TABLET | ORAL | Status: AC
  Filled 2017-08-17: qty 2

## 2017-08-17 NOTE — Addendum Note (Signed)
Addended by: Carolynne Edouard B on: 08/17/2017 09:03 AM   Modules accepted: Orders

## 2017-08-17 NOTE — Progress Notes (Signed)
Patient Care Team: [name], MD as PCP - General (Family Medicine)  DIAGNOSIS:  Encounter Diagnosis  Name Primary?  . Malignant neoplasm of upper-outer quadrant of left breast in female, estrogen receptor positive ([place])     SUMMARY OF ONCOLOGIC HISTORY:   Breast cancer of upper-outer quadrant of left female breast ([place])   11/14/2011 Surgery    Bilateral mastectomy, prophylactic on the right, left breast IDC 3/18 lymph nodes positive with extracapsular extension ER 89%, PR 81%, HER-2 negative, Ki-67 79% T2 N1 A. stage IIB      12/13/2011 - 06/28/2012 Chemotherapy    4 cycles of FEC followed by 4 cycles of Taxotere      07/17/2012 - 08/22/2012 Radiation Therapy    Adjuvant radiation therapy      08/22/2012 - 03/16/2016 Anti-estrogen oral therapy    Arimidex 1 mg daily      03/16/2016 Relapse/Recurrence    Subcutaneous nodule excision left chest: Infiltrating carcinoma breast primary, ER positive, PR negative      03/29/2016 Imaging    CT CAP and bone scan: Lytic lesions T8 vertebral, T1 posterior element, subcutaneous nodule left lateral chest wall, nonspecific lung nodules; Bone scan: Mets to kull, left humerus, left eighth rib, T7/T8, sternum, left acetabulum      04/28/2016 - 06/17/2017 Chemotherapy    Herceptin, lapatinib, Faslodex, Zometa every 4 weeks, lapatinib discontinued in September 2018 due to elevation of LFTs      06/22/2017 Relapse/Recurrence    Surgical excision:Soft tissue mass left lateral chest wall primary breast cancer, soft tissue mass left medial chest wall breast cancer, tumor is within the dermis extending to the subcutaneous adipose tissue and involves portions of skeletal muscle      08/2017 -  Chemotherapy    Faslodex with Herceptin and Perjeta along with Zometa every 4 weeks        CHIEF COMPLIANT: Follow-up after recent scans, extensive rash from zoster  INTERVAL HISTORY: [name] Riley is a 66 year old with above-mentioned 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  Pain.  She is also noticed a rash on the chest and the back for the past couple of days.  This is related to shingles outbreak.  She is also feeling tired and weak as well.  REVIEW OF SYSTEMS:   Constitutional: Denies fevers, chills or abnormal weight loss Eyes: Denies blurriness of vision Ears, nose, mouth, throat, and face: Denies mucositis or sore throat Respiratory: Denies cough, dyspnea or wheezes Cardiovascular: Denies palpitation, chest discomfort Gastrointestinal:  Denies nausea, heartburn or change in bowel habits Skin: Rash on the chest and back Lymphatics: Denies new lymphadenopathy or easy bruising Neurological:Denies numbness, tingling or new weaknesses Behavioral/Psych: Mood is stable, no new changes  Extremities: No lower extremity edema All other systems were reviewed with the patient and are negative.  I have reviewed the past medical history, past surgical history, social history and family history with the patient and they are unchanged from previous note.  ALLERGIES:  is allergic to contrast media [iodinated diagnostic agents]; food; pravastatin; and zosyn [piperacillin sod-tazobactam so].  MEDICATIONS:  Current Outpatient Prescriptions  Medication Sig Dispense Refill  . acetaminophen (TYLENOL) 500 MG tablet Take 1,000 mg by mouth every 6 (six) hours as needed for moderate pain.    Marland Kitchen aspirin 81 MG tablet Take 81 mg by mouth daily.      . ergocalciferol (VITAMIN D2) 50000 units capsule Take 50,000  Units by mouth every Monday.     . levothyroxine (SYNTHROID, LEVOTHROID) 75 MCG tablet Take 75 mcg by mouth daily.     Marland Kitchen lidocaine-prilocaine (EMLA) cream Apply to affected area once (Patient taking differently: as needed (for port access). Apply to affected area once) 30 g 3  . metoprolol succinate (TOPROL-XL) 100 MG 24 hr tablet Take 1 tablet (100 mg total) by mouth  daily. 90 tablet 3  . Multiple Vitamins-Minerals (MULTIVITAMIN WITH MINERALS) tablet Take 1 tablet by mouth daily.      . ondansetron (ZOFRAN-ODT) 4 MG disintegrating tablet Take 1 tablet (4 mg total) by mouth every 6 (six) hours as needed. 20 tablet 3  . valACYclovir (VALTREX) 1000 MG tablet Take 1 tablet (1,000 mg total) by mouth 2 (two) times daily. 14 tablet 1   No current facility-administered medications for this visit.    Facility-Administered Medications Ordered in Other Visits  Medication Dose Route Frequency Provider Last Rate Last Dose  . 0.9 %  sodium chloride infusion   Intravenous Continuous Donnie Mesa, MD      . 0.9 %  sodium chloride infusion   Intravenous Once Nicholas Lose, MD      . acetaminophen (TYLENOL) tablet 975 mg  975 mg Oral Q6H Donnie Mesa, MD      . enoxaparin (LOVENOX) injection 40 mg  40 mg Subcutaneous Q24H Donnie Mesa, MD      . morphine 4 MG/ML injection 2-4 mg  2-4 mg Intravenous Q2H PRN Donnie Mesa, MD      . ondansetron (ZOFRAN-ODT) disintegrating tablet 4 mg  4 mg Oral Q6H PRN Donnie Mesa, MD       Or  . ondansetron (ZOFRAN) 4 mg in sodium chloride 0.9 % 50 mL IVPB  4 mg Intravenous Q6H PRN Donnie Mesa, MD      . ondansetron [facility]) injection 8 mg  8 mg Intravenous Once Nicholas Lose, MD      . oxyCODONE (Oxy IR/ROXICODONE) immediate release tablet 5-10 mg  5-10 mg Oral Q4H PRN Donnie Mesa, MD      . piperacillin-tazobactam (ZOSYN) 3.375 g in dextrose 5 % 50 mL IVPB  3.375 g Intravenous Q8H Donnie Mesa, MD      . prochlorperazine (COMPAZINE) tablet 10 mg  10 mg Oral Q6H PRN Donnie Mesa, MD       Or  . prochlorperazine (COMPAZINE) injection 5-10 mg  5-10 mg Intravenous Q6H PRN Donnie Mesa, MD        PHYSICAL EXAMINATION: ECOG PERFORMANCE STATUS: 2 - Symptomatic, <50% confined to bed  Vitals:   08/17/17 0828  BP: (!) 164/88  Pulse: 84  Resp: 17  Temp: 98.6 F (37 C)  SpO2: 98%   Filed Weights   08/17/17 0828    Weight: 149 lb (67.6 kg)    GENERAL:alert, no distress and comfortable SKIN: Maculopapular eruptions on the chest and back related to shingles EYES: normal, Conjunctiva are pink and non-injected, sclera clear OROPHARYNX:no exudate, no erythema and lips, buccal mucosa, and tongue normal  NECK: supple, thyroid normal size, non-tender, without nodularity LYMPH:  no palpable lymphadenopathy in the cervical, axillary or inguinal LUNGS: clear to auscultation and percussion with normal breathing effort HEART: regular rate & rhythm and no murmurs and no lower extremity edema ABDOMEN:abdomen soft, non-tender and normal bowel sounds MUSCULOSKELETAL:no cyanosis of digits and no clubbing  NEURO: alert & oriented x 3 with fluent speech, no focal motor/sensory deficits EXTREMITIES: No lower extremity edema  LABORATORY DATA:  I have reviewed the data as listed   Chemistry      Component Value Date/Time   NA 136 08/17/2017 0805   K 3.7 08/17/2017 0805   CL 101 07/01/2017 2347   CL 105 08/10/2012 0908   CO2 26 08/17/2017 0805   BUN 9.3 08/17/2017 0805   CREATININE 0.6 08/17/2017 0805      Component Value Date/Time   CALCIUM 9.1 08/17/2017 0805   ALKPHOS 216 (H) 08/17/2017 0805   AST 29 08/17/2017 0805   ALT 27 08/17/2017 0805   BILITOT 0.62 08/17/2017 0805       Lab Results  Component Value Date   WBC 6.5 08/17/2017   HGB 12.8 08/17/2017   HCT 37.6 08/17/2017   MCV 89.6 08/17/2017   PLT 301 08/17/2017   NEUTROABS 4.1 08/17/2017    ASSESSMENT & PLAN:  Breast cancer of upper-outer quadrant of left female breast ([place]) Left breast invasive ductal carcinomaT2, N1, 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 Was on Arimidex since 08/22/2012 to 03/30/16  Subcutaneous nodule excisionleft chest: Infiltrating carcinoma breast primary, ER  positive, PR negative, HER-2 positive  009/03/2017:Left chest wall nodule excision recurrent breast cancer  Treatment Plan: Herceptin Q 4 weeks, Faslodex, Zometa, started 04/28/2016 ( lapatinib discontinued because of elevated LFTs); adding Perjeta from December 2018  Toxicities: 1. Diarrhea from lapatinib, discontinued  Due to elevated LFTs 2. Injection site discomfort from Faslodex 3. Hospitalization for left chest wall cellulitis and infection ---------------------------------------------------- CT CAP: 08/11/17: Open wound. Diffuse bone mets. Bone Scan 08/11/17: Progression. Left femur mets are new. Existing lesions show inc activity  Plan: recommend adding either Perjeta with her next months treatment Shingles: I prescribed her Valtrex today. Return to clinic in 1 month for injections and follow-up.   I spent 25 minutes talking to the patient of which more than half was spent in counseling and coordination of care.  No orders of the defined types were placed in this encounter.  The patient has a good understanding of the overall plan. she agrees with it. she will call with any problems that may develop before the next visit here.   Rulon Eisenmenger, MD 08/17/17

## 2017-08-17 NOTE — Patient Instructions (Signed)
[facility] Discharge Instructions for Patients Receiving Chemotherapy  Today you received the following chemotherapy agents: Trastuzumab (Herceptin)  To help prevent nausea and vomiting after your treatment, we encourage you to take your nausea medication as directed.    If you develop nausea and vomiting that is not controlled by your nausea medication, call the clinic.   BELOW ARE SYMPTOMS THAT SHOULD BE REPORTED IMMEDIATELY:  *FEVER GREATER THAN 100.5 F  *CHILLS WITH OR WITHOUT FEVER  NAUSEA AND VOMITING THAT IS NOT CONTROLLED WITH YOUR NAUSEA MEDICATION  *UNUSUAL SHORTNESS OF BREATH  *UNUSUAL BRUISING OR BLEEDING  TENDERNESS IN MOUTH AND THROAT WITH OR WITHOUT PRESENCE OF ULCERS  *URINARY PROBLEMS  *BOWEL PROBLEMS  UNUSUAL RASH Items with * indicate a potential emergency and should be followed up as soon as possible.  Feel free to call the clinic you have any questions or concerns. The clinic phone number is [phone].  Please show the [facility] at check-in to the Emergency Department and triage nurse.  Zoledronic Acid injection (Hypercalcemia, Oncology) What is this medicine? ZOLEDRONIC ACID (ZOE le dron ik AS id) lowers the amount of calcium loss from bone. It is used to treat too much calcium in your blood from cancer. It is also used to prevent complications of cancer that has spread to the bone. This medicine may be used for other purposes; ask your health care provider or pharmacist if you have questions. COMMON BRAND NAME(S): Zometa What should I tell my health care provider before I take this medicine? They need to know if you have any of these conditions: -aspirin-sensitive asthma -cancer, especially if you are receiving medicines used to treat cancer -dental disease or wear dentures -infection -kidney disease -receiving corticosteroids like dexamethasone or prednisone -an unusual or allergic reaction to zoledronic acid, other  medicines, foods, dyes, or preservatives -pregnant or trying to get pregnant -breast-feeding How should I use this medicine? This medicine is for infusion into a vein. It is given by a health care professional in a hospital or clinic setting. Talk to your pediatrician regarding the use of this medicine in children. Special care may be needed. Overdosage: If you think you have taken too much of this medicine contact a poison control center or emergency room at once. NOTE: This medicine is only for you. Do not share this medicine with others. What if I miss a dose? It is important not to miss your dose. Call your doctor or health care professional if you are unable to keep an appointment. What may interact with this medicine? -certain antibiotics given by injection -NSAIDs, medicines for pain and inflammation, like ibuprofen or naproxen -some diuretics like bumetanide, furosemide -teriparatide -thalidomide This list may not describe all possible interactions. Give your health care provider a list of all the medicines, herbs, non-prescription drugs, or dietary supplements you use. Also tell them if you smoke, drink alcohol, or use illegal drugs. Some items may interact with your medicine. What should I watch for while using this medicine? Visit your doctor or health care professional for regular checkups. It may be some time before you see the benefit from this medicine. Do not stop taking your medicine unless your doctor tells you to. Your doctor may order blood tests or other tests to see how you are doing. Women should inform their doctor if they wish to become pregnant or think they might be pregnant. There is a potential for serious side effects to an unborn child. Talk  to your health care professional or pharmacist for more information. You should make sure that you get enough calcium and vitamin D while you are taking this medicine. Discuss the foods you eat and the vitamins you take with  your health care professional. Some people who take this medicine have severe bone, joint, and/or muscle pain. This medicine may also increase your risk for jaw problems or a broken thigh bone. Tell your doctor right away if you have severe pain in your jaw, bones, joints, or muscles. Tell your doctor if you have any pain that does not go away or that gets worse. Tell your dentist and dental surgeon that you are taking this medicine. You should not have major dental surgery while on this medicine. See your dentist to have a dental exam and fix any dental problems before starting this medicine. Take good care of your teeth while on this medicine. Make sure you see your dentist for regular follow-up appointments. What side effects may I notice from receiving this medicine? Side effects that you should report to your doctor or health care professional as soon as possible: -allergic reactions like skin rash, itching or hives, swelling of the face, lips, or tongue -anxiety, confusion, or depression -breathing problems -changes in vision -eye pain -feeling faint or lightheaded, falls -jaw pain, especially after dental work -mouth sores -muscle cramps, stiffness, or weakness -redness, blistering, peeling or loosening of the skin, including inside the mouth -trouble passing urine or change in the amount of urine Side effects that usually do not require medical attention (report to your doctor or health care professional if they continue or are bothersome): -bone, joint, or muscle pain -constipation -diarrhea -fever -hair loss -irritation at site where injected -loss of appetite -nausea, vomiting -stomach upset -trouble sleeping -trouble swallowing -weak or tired This list may not describe all possible side effects. Call your doctor for medical advice about side effects. You may report side effects to FDA at 1-800-FDA-1088. Where should I keep my medicine? This drug is given in a hospital or  clinic and will not be stored at home. NOTE: This sheet is a summary. It may not cover all possible information. If you have questions about this medicine, talk to your doctor, pharmacist, or health care provider.  2018 Elsevier/Gold Standard (2014-03-01 14:19:39)  Fulvestrant injection What is this medicine? FULVESTRANT (ful VES trant) blocks the effects of estrogen. It is used to treat breast cancer. This medicine may be used for other purposes; ask your health care provider or pharmacist if you have questions. COMMON BRAND NAME(S): FASLODEX What should I tell my health care provider before I take this medicine? They need to know if you have any of these conditions: -bleeding problems -liver disease -low levels of platelets in the blood -an unusual or allergic reaction to fulvestrant, other medicines, foods, dyes, or preservatives -pregnant or trying to get pregnant -breast-feeding How should I use this medicine? This medicine is for injection into a muscle. It is usually given by a health care professional in a hospital or clinic setting. Talk to your pediatrician regarding the use of this medicine in children. Special care may be needed. Overdosage: If you think you have taken too much of this medicine contact a poison control center or emergency room at once. NOTE: This medicine is only for you. Do not share this medicine with others. What if I miss a dose? It is important not to miss your dose. Call your doctor or health care professional  if you are unable to keep an appointment. What may interact with this medicine? -medicines that treat or prevent blood clots like warfarin, enoxaparin, and dalteparin This list may not describe all possible interactions. Give your health care provider a list of all the medicines, herbs, non-prescription drugs, or dietary supplements you use. Also tell them if you smoke, drink alcohol, or use illegal drugs. Some items may interact with your  medicine. What should I watch for while using this medicine? Your condition will be monitored carefully while you are receiving this medicine. You will need important blood work done while you are taking this medicine. Do not become pregnant while taking this medicine or for at least 1 year after stopping it. Women of child-bearing potential will need to have a negative pregnancy test before starting this medicine. Women should inform their doctor if they wish to become pregnant or think they might be pregnant. There is a potential for serious side effects to an unborn child. Men should inform their doctors if they wish to father a child. This medicine may lower sperm counts. Talk to your health care professional or pharmacist for more information. Do not breast-feed an infant while taking this medicine or for 1 year after the last dose. What side effects may I notice from receiving this medicine? Side effects that you should report to your doctor or health care professional as soon as possible: -allergic reactions like skin rash, itching or hives, swelling of the face, lips, or tongue -feeling faint or lightheaded, falls -pain, tingling, numbness, or weakness in the legs -signs and symptoms of infection like fever or chills; cough; flu-like symptoms; sore throat -vaginal bleeding Side effects that usually do not require medical attention (report to your doctor or health care professional if they continue or are bothersome): -aches, pains -constipation -diarrhea -headache -hot flashes -nausea, vomiting -pain at site where injected -stomach pain This list may not describe all possible side effects. Call your doctor for medical advice about side effects. You may report side effects to FDA at 1-800-FDA-1088. Where should I keep my medicine? This drug is given in a hospital or clinic and will not be stored at home. NOTE: This sheet is a summary. It may not cover all possible information. If you  have questions about this medicine, talk to your doctor, pharmacist, or health care provider.  2018 Elsevier/Gold Standard (2015-05-01 11:03:55)

## 2017-08-17 NOTE — Telephone Encounter (Signed)
Pre-cert Verification for the following procedure   Echo scheduled for 08-24-2017

## 2017-08-18 ENCOUNTER — Telehealth: Payer: Self-pay

## 2017-08-18 NOTE — Telephone Encounter (Signed)
No los per 11/1

## 2017-08-24 ENCOUNTER — Other Ambulatory Visit: Payer: 59

## 2017-08-24 ENCOUNTER — Other Ambulatory Visit: Payer: Self-pay

## 2017-08-24 MED ORDER — OXYCODONE-ACETAMINOPHEN 5-325 MG PO TABS: 1.0000 | ORAL_TABLET | ORAL | 0 refills | Status: DC | PRN

## 2017-08-24 MED ORDER — GABAPENTIN 300 MG PO CAPS: 300.0000 mg | ORAL_CAPSULE | Freq: Every day | ORAL | 0 refills | Status: DC

## 2017-08-24 NOTE — Telephone Encounter (Signed)
Received Mrs. Arango's VM about her shingles pain. Informed her Dr. Lindi Adie wrote a script for percocet for her pain and the script would need to be picked up at the office. She is unsure she will be able to make it here. Informed her he also wrote a script for Gabapentin and that was sent to her pharmacy in [place]. She informed me that she she had to cancel her Echo due to the shingles. She is aware it needs to be rescheduled prior to her next infusion. No other questions at this time.  Cyndia Bent RN

## 2017-08-25 ENCOUNTER — Other Ambulatory Visit: Payer: Self-pay

## 2017-08-30 ENCOUNTER — Telehealth: Payer: Self-pay

## 2017-08-30 MED ORDER — VALACYCLOVIR HCL 1 G PO TABS: 1000.0000 mg | ORAL_TABLET | Freq: Two times a day (BID) | ORAL | 1 refills | Status: DC

## 2017-08-30 NOTE — Telephone Encounter (Signed)
Will send msg to scheduling to schedule pt for follow up with MD. Thanks for refilling her Valtrex.

## 2017-08-30 NOTE — Telephone Encounter (Signed)
Pt shingles are clearing up. She still has a few places across her shoulders and back of neck. She is asking for refill of valtrex. Does Dr Lindi Adie want her to go on prophylactic valtrex? She also still has the burning and stinging. Sent rx to [facility].  She has not had her EKG yet d/t the shingles on her left chest.   No next appt scheduled at present.

## 2017-08-30 NOTE — Addendum Note (Signed)
Addended by: Janace Hoard on: 08/30/2017 12:26 PM   Modules accepted: Orders

## 2017-09-06 ENCOUNTER — Other Ambulatory Visit: Payer: Self-pay

## 2017-09-06 MED ORDER — METOPROLOL SUCCINATE ER 100 MG PO TB24: 100.0000 mg | ORAL_TABLET | Freq: Every day | ORAL | 3 refills | Status: DC

## 2017-09-11 ENCOUNTER — Other Ambulatory Visit (INDEPENDENT_AMBULATORY_CARE_PROVIDER_SITE_OTHER): Payer: 59

## 2017-09-11 DIAGNOSIS — R945 Abnormal results of liver function studies: Secondary | ICD-10-CM

## 2017-09-11 DIAGNOSIS — R7989 Other specified abnormal findings of blood chemistry: Secondary | ICD-10-CM

## 2017-09-11 LAB — HEPATIC FUNCTION PANEL
ALT: 29 U/L (ref 0–35)
AST: 34 U/L (ref 0–37)
Albumin: 4.3 g/dL (ref 3.5–5.2)
Alkaline Phosphatase: 146 U/L — ABNORMAL HIGH (ref 39–117)
Bilirubin, Direct: 0.1 mg/dL (ref 0.0–0.3)
Total Bilirubin: 0.5 mg/dL (ref 0.2–1.2)
Total Protein: 7.1 g/dL (ref 6.0–8.3)

## 2017-09-12 ENCOUNTER — Telehealth: Payer: Self-pay

## 2017-09-12 NOTE — Telephone Encounter (Signed)
Called and spoke with pt today. She is requesting for her next appt. And echo to be scheduled. Told pt that a message to scheduling had been sent and should be receiving a call with appointments this week. Told pt to call our office if she has not heard from scheduling dept by Thursday or Friday. Pt verbalized understanding.

## 2017-09-13 ENCOUNTER — Other Ambulatory Visit: Payer: Self-pay

## 2017-09-13 DIAGNOSIS — R945 Abnormal results of liver function studies: Principal | ICD-10-CM

## 2017-09-13 DIAGNOSIS — R7989 Other specified abnormal findings of blood chemistry: Secondary | ICD-10-CM

## 2017-09-14 ENCOUNTER — Other Ambulatory Visit: Payer: Self-pay

## 2017-09-14 ENCOUNTER — Telehealth: Payer: Self-pay | Admitting: Medical Oncology

## 2017-09-14 DIAGNOSIS — Z170 Estrogen receptor positive status [ER+]: Secondary | ICD-10-CM

## 2017-09-14 DIAGNOSIS — C50412 Malignant neoplasm of upper-outer quadrant of left female breast: Secondary | ICD-10-CM

## 2017-09-14 NOTE — Telephone Encounter (Signed)
No appts scheduled for infusion or echo.call forwarded to May.

## 2017-09-15 ENCOUNTER — Other Ambulatory Visit: Payer: Self-pay

## 2017-09-15 ENCOUNTER — Telehealth: Payer: Self-pay | Admitting: Hematology and Oncology

## 2017-09-15 MED ORDER — VALACYCLOVIR HCL 1 G PO TABS: 1000.0000 mg | ORAL_TABLET | Freq: Two times a day (BID) | ORAL | 1 refills | Status: DC

## 2017-09-15 NOTE — Telephone Encounter (Signed)
Spoke to patient regarding upcoming December appointment updates per 11/27 sch message °

## 2017-09-19 ENCOUNTER — Other Ambulatory Visit (HOSPITAL_COMMUNITY): Payer: 59

## 2017-09-22 ENCOUNTER — Telehealth: Payer: Self-pay | Admitting: Hematology and Oncology

## 2017-09-22 ENCOUNTER — Ambulatory Visit (HOSPITAL_BASED_OUTPATIENT_CLINIC_OR_DEPARTMENT_OTHER): Payer: 59 | Admitting: Hematology and Oncology

## 2017-09-22 ENCOUNTER — Ambulatory Visit (HOSPITAL_COMMUNITY)
Admission: RE | Admit: 2017-09-22 | Discharge: 2017-09-22 | Disposition: A | Discharge status: Home / Self Care | Payer: 59 | Source: Ambulatory Visit | Attending: Hematology and Oncology | Admitting: Hematology and Oncology

## 2017-09-22 ENCOUNTER — Ambulatory Visit (HOSPITAL_BASED_OUTPATIENT_CLINIC_OR_DEPARTMENT_OTHER): Payer: 59

## 2017-09-22 ENCOUNTER — Other Ambulatory Visit (HOSPITAL_BASED_OUTPATIENT_CLINIC_OR_DEPARTMENT_OTHER): Payer: 59

## 2017-09-22 ENCOUNTER — Ambulatory Visit: Payer: 59

## 2017-09-22 ENCOUNTER — Other Ambulatory Visit: Payer: Self-pay | Admitting: Hematology and Oncology

## 2017-09-22 VITALS — BP 148/83 | HR 78

## 2017-09-22 DIAGNOSIS — Z5112 Encounter for antineoplastic immunotherapy: Secondary | ICD-10-CM

## 2017-09-22 DIAGNOSIS — C773 Secondary and unspecified malignant neoplasm of axilla and upper limb lymph nodes: Secondary | ICD-10-CM

## 2017-09-22 DIAGNOSIS — I252 Old myocardial infarction: Secondary | ICD-10-CM | POA: Diagnosis not present

## 2017-09-22 DIAGNOSIS — Z853 Personal history of malignant neoplasm of breast: Secondary | ICD-10-CM | POA: Insufficient documentation

## 2017-09-22 DIAGNOSIS — C792 Secondary malignant neoplasm of skin: Secondary | ICD-10-CM

## 2017-09-22 DIAGNOSIS — C50412 Malignant neoplasm of upper-outer quadrant of left female breast: Secondary | ICD-10-CM

## 2017-09-22 DIAGNOSIS — Z5181 Encounter for therapeutic drug level monitoring: Secondary | ICD-10-CM | POA: Insufficient documentation

## 2017-09-22 DIAGNOSIS — E785 Hyperlipidemia, unspecified: Secondary | ICD-10-CM | POA: Diagnosis not present

## 2017-09-22 DIAGNOSIS — C7951 Secondary malignant neoplasm of bone: Secondary | ICD-10-CM

## 2017-09-22 DIAGNOSIS — I10 Essential (primary) hypertension: Secondary | ICD-10-CM | POA: Insufficient documentation

## 2017-09-22 DIAGNOSIS — Z5111 Encounter for antineoplastic chemotherapy: Secondary | ICD-10-CM | POA: Diagnosis not present

## 2017-09-22 DIAGNOSIS — Z170 Estrogen receptor positive status [ER+]: Secondary | ICD-10-CM

## 2017-09-22 DIAGNOSIS — Z79899 Other long term (current) drug therapy: Secondary | ICD-10-CM | POA: Diagnosis not present

## 2017-09-22 DIAGNOSIS — R002 Palpitations: Secondary | ICD-10-CM | POA: Diagnosis not present

## 2017-09-22 LAB — COMPREHENSIVE METABOLIC PANEL WITH GFR
ALT: 31 U/L (ref 0–55)
AST: 37 U/L — ABNORMAL HIGH (ref 5–34)
Albumin: 4 g/dL (ref 3.5–5.0)
Alkaline Phosphatase: 140 U/L (ref 40–150)
Anion Gap: 9 meq/L (ref 3–11)
BUN: 12.8 mg/dL (ref 7.0–26.0)
CO2: 26 meq/L (ref 22–29)
Calcium: 9.1 mg/dL (ref 8.4–10.4)
Chloride: 104 meq/L (ref 98–109)
Creatinine: 0.6 mg/dL (ref 0.6–1.1)
EGFR: >60 ml/min/1.73 m2
Glucose: 80 mg/dL (ref 70–140)
Potassium: 3.7 meq/L (ref 3.5–5.1)
Sodium: 140 meq/L (ref 136–145)
Total Bilirubin: 0.41 mg/dL (ref 0.20–1.20)
Total Protein: 7 g/dL (ref 6.4–8.3)

## 2017-09-22 LAB — CBC WITH DIFFERENTIAL/PLATELET
BASO%: 0.3 % (ref 0.0–2.0)
Basophils Absolute: 0 10*3/uL (ref 0.0–0.1)
EOS%: 1.8 % (ref 0.0–7.0)
Eosinophils Absolute: 0.1 10*3/uL (ref 0.0–0.5)
HCT: 34.9 % (ref 34.8–46.6)
HGB: 11.5 g/dL — ABNORMAL LOW (ref 11.6–15.9)
LYMPH%: 32.6 % (ref 14.0–49.7)
MCH: 31.1 pg (ref 25.1–34.0)
MCHC: 33 g/dL (ref 31.5–36.0)
MCV: 94.3 fL (ref 79.5–101.0)
MONO#: 0.8 10*3/uL (ref 0.1–0.9)
MONO%: 12.3 % (ref 0.0–14.0)
NEUT#: 3.5 10*3/uL (ref 1.5–6.5)
NEUT%: 53 % (ref 38.4–76.8)
Platelets: 295 10*3/uL (ref 145–400)
RBC: 3.7 10*6/uL (ref 3.70–5.45)
RDW: 15.3 % — ABNORMAL HIGH (ref 11.2–14.5)
WBC: 6.6 10*3/uL (ref 3.9–10.3)
lymph#: 2.2 10*3/uL (ref 0.9–3.3)

## 2017-09-22 MED ORDER — ZOLEDRONIC ACID 4 MG/100ML IV SOLN
4.0000 mg | Freq: Once | INTRAVENOUS | Status: AC
  Administered 2017-09-22: 4 mg via INTRAVENOUS
  Filled 2017-09-22: qty 100

## 2017-09-22 MED ORDER — SODIUM CHLORIDE 0.9 % IV SOLN
Freq: Once | INTRAVENOUS | Status: AC
  Administered 2017-09-22: 12:00:00 via INTRAVENOUS

## 2017-09-22 MED ORDER — TRASTUZUMAB CHEMO 150 MG IV SOLR
450.0000 mg | Freq: Once | INTRAVENOUS | Status: AC
  Administered 2017-09-22: 450 mg via INTRAVENOUS
  Filled 2017-09-22: qty 21.43

## 2017-09-22 MED ORDER — FULVESTRANT 250 MG/5ML IM SOLN
500.0000 mg | Freq: Once | INTRAMUSCULAR | Status: AC
  Administered 2017-09-22: 500 mg via INTRAMUSCULAR

## 2017-09-22 MED ORDER — HEPARIN SOD (PORK) LOCK FLUSH 100 UNIT/ML IV SOLN
500.0000 [IU] | Freq: Once | INTRAVENOUS | Status: AC | PRN
  Administered 2017-09-22: 500 [IU]
  Filled 2017-09-22: qty 5

## 2017-09-22 MED ORDER — ACETAMINOPHEN 325 MG PO TABS
650.0000 mg | ORAL_TABLET | Freq: Once | ORAL | Status: AC
  Administered 2017-09-22: 650 mg via ORAL

## 2017-09-22 MED ORDER — ACETAMINOPHEN 325 MG PO TABS
ORAL_TABLET | ORAL | Status: AC
  Filled 2017-09-22: qty 2

## 2017-09-22 MED ORDER — FULVESTRANT 250 MG/5ML IM SOLN
INTRAMUSCULAR | Status: AC
  Filled 2017-09-22: qty 10

## 2017-09-22 MED ORDER — DIPHENHYDRAMINE HCL 25 MG PO CAPS
50.0000 mg | ORAL_CAPSULE | Freq: Once | ORAL | Status: AC
  Administered 2017-09-22: 50 mg via ORAL

## 2017-09-22 MED ORDER — METOPROLOL SUCCINATE ER 100 MG PO TB24: 100.0000 mg | ORAL_TABLET | Freq: Every day | ORAL | 3 refills | Status: DC

## 2017-09-22 MED ORDER — DIPHENHYDRAMINE HCL 25 MG PO CAPS
ORAL_CAPSULE | ORAL | Status: AC
  Filled 2017-09-22: qty 2

## 2017-09-22 MED ORDER — SODIUM CHLORIDE 0.9% FLUSH
10.0000 mL | INTRAVENOUS | Status: DC | PRN
  Administered 2017-09-22: 10 mL
  Filled 2017-09-22: qty 10

## 2017-09-22 NOTE — Progress Notes (Addendum)
  Echocardiogram 2D Echocardiogram has been performed.  Patient stated she had surgery on left breast in September. Study was technically difficult due to having to alter probe position around incision.   Jihan Mellette L Androw 09/22/2017, 9:38 AM

## 2017-09-22 NOTE — Patient Instructions (Signed)
[facility] Discharge Instructions for Patients Receiving Chemotherapy  Today you received the following chemotherapy agents Herceptin; Falsodex  To help prevent nausea and vomiting after your treatment, we encourage you to take your nausea medication as directed   If you develop nausea and vomiting that is not controlled by your nausea medication, call the clinic.   BELOW ARE SYMPTOMS THAT SHOULD BE REPORTED IMMEDIATELY:  *FEVER GREATER THAN 100.5 F  *CHILLS WITH OR WITHOUT FEVER  NAUSEA AND VOMITING THAT IS NOT CONTROLLED WITH YOUR NAUSEA MEDICATION  *UNUSUAL SHORTNESS OF BREATH  *UNUSUAL BRUISING OR BLEEDING  TENDERNESS IN MOUTH AND THROAT WITH OR WITHOUT PRESENCE OF ULCERS  *URINARY PROBLEMS  *BOWEL PROBLEMS  UNUSUAL RASH Items with * indicate a potential emergency and should be followed up as soon as possible.  Feel free to call the clinic should you have any questions or concerns. The clinic phone number is [phone].  Please show the [facility] at check-in to the Emergency Department and triage nurse.

## 2017-09-22 NOTE — Assessment & Plan Note (Signed)
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 Was on Arimidex since 08/22/2012 to 03/30/16  Subcutaneous nodule excisionleft chest: Infiltrating carcinoma breast primary, ER positive, PR negative, HER-2 positive  009/03/2017:Left chest wall nodule excision recurrent breast cancer   CT CAP: 08/11/17: Open wound. Diffuse bone mets. Bone Scan 08/11/17: Progression. Left femur mets are new. Existing lesions show inc activity  Current treatment: Herceptin and Perjeta (added 09/22/2017 ) Q 4 weeks, Faslodex, Zometa, started 04/28/2016   Toxicities: 1.  Shingles: Treated with Valtrex with improvement 2. monitoring closely for cardiac toxicities 3.  We will need to monitor for diarrhea related to Perjeta  Return to clinic in 1 month for cycle 2 of Herceptin and Perjeta along with Faslodex

## 2017-09-22 NOTE — Progress Notes (Signed)
Per Jonelle Sidle, RN per Dr. Lindi Adie okay to treat today with current ECHO.

## 2017-09-22 NOTE — Progress Notes (Signed)
Patient Care Team: [name], MD as PCP - General (Family Medicine)  DIAGNOSIS:  Encounter Diagnosis  Name Primary?  . Malignant neoplasm of upper-outer quadrant of left breast in female, estrogen receptor positive ([place])     SUMMARY OF ONCOLOGIC HISTORY:   Breast cancer of upper-outer quadrant of left female breast ([place])   11/14/2011 Surgery    Bilateral mastectomy, prophylactic on the right, left breast IDC 3/18 lymph nodes positive with extracapsular extension ER 89%, PR 81%, HER-2 negative, Ki-67 79% T2 N1 A. stage IIB      12/13/2011 - 06/28/2012 Chemotherapy    4 cycles of FEC followed by 4 cycles of Taxotere      07/17/2012 - 08/22/2012 Radiation Therapy    Adjuvant radiation therapy      08/22/2012 - 03/16/2016 Anti-estrogen oral therapy    Arimidex 1 mg daily      03/16/2016 Relapse/Recurrence    Subcutaneous nodule excision left chest: Infiltrating carcinoma breast primary, ER positive, PR negative      03/29/2016 Imaging    CT CAP and bone scan: Lytic lesions T8 vertebral, T1 posterior element, subcutaneous nodule left lateral chest wall, nonspecific lung nodules; Bone scan: Mets to kull, left humerus, left eighth rib, T7/T8, sternum, left acetabulum      04/28/2016 - 06/17/2017 Chemotherapy    Herceptin, lapatinib, Faslodex, Zometa every 4 weeks, lapatinib discontinued in September 2018 due to elevation of LFTs      06/22/2017 Relapse/Recurrence    Surgical excision:Soft tissue mass left lateral chest wall primary breast cancer, soft tissue mass left medial chest wall breast cancer, tumor is within the dermis extending to the subcutaneous adipose tissue and involves portions of skeletal muscle      08/2017 -  Chemotherapy    Faslodex with Herceptin and Perjeta along with Zometa every 4 weeks        CHIEF COMPLIANT: Adding Perjeta to Herceptin  INTERVAL HISTORY: [name] Riley is a 66 year old with above-mentioned history metastatic breast cancer who has  both estrogen receptor and HER-2 receptor.  She is currently on treatment with Herceptin and today's cycle we are adding Perjeta because a recent CT scan showed evidence of progression.  She will also continue with the Faslodex and Zometa.  REVIEW OF SYSTEMS:   Constitutional: Denies fevers, chills or abnormal weight loss Eyes: Denies blurriness of vision Ears, nose, mouth, throat, and face: Denies mucositis or sore throat Respiratory: Denies cough, dyspnea or wheezes Cardiovascular: Denies palpitation, chest discomfort Gastrointestinal:  Denies nausea, heartburn or change in bowel habits Skin: Denies abnormal skin rashes Lymphatics: Denies new lymphadenopathy or easy bruising Neurological:Denies numbness, tingling or new weaknesses Behavioral/Psych: Mood is stable, no new changes  Extremities: No lower extremity edema  All other systems were reviewed with the patient and are negative.  I have reviewed the past medical history, past surgical history, social history and family history with the patient and they are unchanged from previous note.  ALLERGIES:  is allergic to contrast media [iodinated diagnostic agents]; food; pravastatin; and zosyn [piperacillin sod-tazobactam so].  MEDICATIONS:  Current Outpatient Medications  Medication Sig Dispense Refill  . acetaminophen (TYLENOL) 500 MG tablet Take 1,000 mg by mouth every 6 (six) hours as needed for moderate pain.    Marland Kitchen aspirin 81 MG tablet Take 81 mg by mouth daily.      . ergocalciferol (VITAMIN D2) 50000 units capsule Take 50,000 Units by mouth every Monday.     . gabapentin (NEURONTIN) 300 MG capsule  Take 1 capsule (300 mg total) at bedtime by mouth. 30 capsule 0  . levothyroxine (SYNTHROID, LEVOTHROID) 75 MCG tablet Take 75 mcg by mouth daily.     Marland Kitchen lidocaine-prilocaine (EMLA) cream Apply to affected area once (Patient taking differently: as needed (for port access). Apply to affected area once) 30 g 3  . metoprolol succinate  (TOPROL-XL) 100 MG 24 hr tablet Take 1 tablet (100 mg total) by mouth daily. 90 tablet 3  . Multiple Vitamins-Minerals (MULTIVITAMIN WITH MINERALS) tablet Take 1 tablet by mouth daily.      . ondansetron (ZOFRAN-ODT) 4 MG disintegrating tablet Take 1 tablet (4 mg total) by mouth every 6 (six) hours as needed. 20 tablet 3  . oxyCODONE-acetaminophen (PERCOCET/ROXICET) 5-325 MG tablet Take 1 tablet every 4 (four) hours as needed by mouth for severe pain. 60 tablet 0  . valACYclovir (VALTREX) 1000 MG tablet Take 1 tablet (1,000 mg total) by mouth 2 (two) times daily. 14 tablet 1   No current facility-administered medications for this visit.    Facility-Administered Medications Ordered in Other Visits  Medication Dose Route Frequency Provider Last Rate Last Dose  . 0.9 %  sodium chloride infusion   Intravenous Continuous Donnie Mesa, MD      . acetaminophen (TYLENOL) tablet 975 mg  975 mg Oral Q6H Donnie Mesa, MD      . enoxaparin (LOVENOX) injection 40 mg  40 mg Subcutaneous Q24H Donnie Mesa, MD      . morphine 4 MG/ML injection 2-4 mg  2-4 mg Intravenous Q2H PRN Donnie Mesa, MD      . ondansetron (ZOFRAN-ODT) disintegrating tablet 4 mg  4 mg Oral Q6H PRN Donnie Mesa, MD       Or  . ondansetron (ZOFRAN) 4 mg in sodium chloride 0.9 % 50 mL IVPB  4 mg Intravenous Q6H PRN Donnie Mesa, MD      . oxyCODONE (Oxy IR/ROXICODONE) immediate release tablet 5-10 mg  5-10 mg Oral Q4H PRN Donnie Mesa, MD      . piperacillin-tazobactam (ZOSYN) 3.375 g in dextrose 5 % 50 mL IVPB  3.375 g Intravenous Q8H Donnie Mesa, MD      . prochlorperazine (COMPAZINE) tablet 10 mg  10 mg Oral Q6H PRN Donnie Mesa, MD       Or  . prochlorperazine (COMPAZINE) injection 5-10 mg  5-10 mg Intravenous Q6H PRN Donnie Mesa, MD        PHYSICAL EXAMINATION: ECOG PERFORMANCE STATUS: 1 - Symptomatic but completely ambulatory  Vitals:   09/22/17 1104  BP: (!) 172/102  Pulse: 83  Resp: 20  Temp: 98.6 F  (37 C)  SpO2: 100%   Filed Weights   09/22/17 1104  Weight: 152 lb 9.6 oz (69.2 kg)    GENERAL:alert, no distress and comfortable SKIN: skin color, texture, turgor are normal, no rashes or significant lesions, cutaneous metastases along the left chest wall EYES: normal, Conjunctiva are pink and non-injected, sclera clear OROPHARYNX:no exudate, no erythema and lips, buccal mucosa, and tongue normal  NECK: supple, thyroid normal size, non-tender, without nodularity LYMPH:  no palpable lymphadenopathy in the cervical, axillary or inguinal LUNGS: clear to auscultation and percussion with normal breathing effort HEART: regular rate & rhythm and no murmurs and no lower extremity edema ABDOMEN:abdomen soft, non-tender and normal bowel sounds MUSCULOSKELETAL:no cyanosis of digits and no clubbing  NEURO: alert & oriented x 3 with fluent speech, no focal motor/sensory deficits EXTREMITIES: No lower extremity edema  LABORATORY DATA:  I  have reviewed the data as listed   Chemistry      Component Value Date/Time   NA 136 08/17/2017 0805   K 3.7 08/17/2017 0805   CL 101 07/01/2017 2347   CL 105 08/10/2012 0908   CO2 26 08/17/2017 0805   BUN 9.3 08/17/2017 0805   CREATININE 0.6 08/17/2017 0805      Component Value Date/Time   CALCIUM 9.1 08/17/2017 0805   ALKPHOS 146 (H) 09/11/2017 0938   ALKPHOS 216 (H) 08/17/2017 0805   AST 34 09/11/2017 0938   AST 29 08/17/2017 0805   ALT 29 09/11/2017 0938   ALT 27 08/17/2017 0805   BILITOT 0.5 09/11/2017 0938   BILITOT 0.62 08/17/2017 0805       Lab Results  Component Value Date   WBC 6.6 09/22/2017   HGB 11.5 (L) 09/22/2017   HCT 34.9 09/22/2017   MCV 94.3 09/22/2017   PLT 295 09/22/2017   NEUTROABS 3.5 09/22/2017    ASSESSMENT & PLAN:  Breast cancer of upper-outer quadrant of left female breast ([place]) Left breast invasive ductal carcinomaT2, N1, M0 stage IIB 3 of 18 lymph nodes positive with extracapsular extension ER 89% PR 81%  HER-2 negative Ki-67 79% status post 4 cycles of FEC and 4 cycles of Taxotere and adjuvant radiation. Was on Arimidex since 08/22/2012 to 03/30/16  Subcutaneous nodule excisionleft chest: Infiltrating carcinoma breast primary, ER positive, PR negative, HER-2 positive  009/03/2017:Left chest wall nodule excision recurrent breast cancer   CT CAP: 08/11/17: Open wound. Diffuse bone mets. Bone Scan 08/11/17: Progression. Left femur mets are new. Existing lesions show inc activity  Current treatment: Herceptin and Perjeta (added 09/22/2017 ) Q 4 weeks, Faslodex, Zometa, started 04/28/2016  In fact Perjeta will be given next week because patient has to leave in order to close on a house.   Toxicities: 1.  Shingles: Treated with Valtrex with improvement; I instructed her to take another week of Valtrex because she continues to have neurological symptoms related to shingles. 2. monitoring closely for cardiac toxicities 3.  We will need to monitor for diarrhea related to Perjeta  Return to clinic in 1 month for cycle 2 of Herceptin and Perjeta along with Faslodex    I spent 25 minutes talking to the patient of which more than half was spent in counseling and coordination of care.  No orders of the defined types were placed in this encounter.  The patient has a good understanding of the overall plan. she agrees with it. she will call with any problems that may develop before the next visit here.   Rulon Eisenmenger, MD 09/22/17

## 2017-09-22 NOTE — Progress Notes (Signed)
Patient has somewhere to be at 4pm; per Dr. Lindi Adie, okay to reschedule 1st time perjeta treatment for next week and proceed with Herceptin today.

## 2017-09-22 NOTE — Progress Notes (Signed)
Ok to treat with previous Echo per Dr. Lindi Adie. Let RN infusion aware. Cyndia Bent RN

## 2017-09-22 NOTE — Telephone Encounter (Signed)
PER 12/7 LOS scheduled appts

## 2017-09-29 ENCOUNTER — Ambulatory Visit (HOSPITAL_BASED_OUTPATIENT_CLINIC_OR_DEPARTMENT_OTHER): Payer: 59

## 2017-09-29 VITALS — BP 148/80 | HR 77 | Temp 98.0°F | Resp 18

## 2017-09-29 DIAGNOSIS — Z452 Encounter for adjustment and management of vascular access device: Secondary | ICD-10-CM

## 2017-09-29 DIAGNOSIS — Z5112 Encounter for antineoplastic immunotherapy: Secondary | ICD-10-CM

## 2017-09-29 DIAGNOSIS — C50412 Malignant neoplasm of upper-outer quadrant of left female breast: Secondary | ICD-10-CM

## 2017-09-29 DIAGNOSIS — C773 Secondary and unspecified malignant neoplasm of axilla and upper limb lymph nodes: Secondary | ICD-10-CM

## 2017-09-29 DIAGNOSIS — Z170 Estrogen receptor positive status [ER+]: Secondary | ICD-10-CM

## 2017-09-29 MED ORDER — SODIUM CHLORIDE 0.9% FLUSH
10.0000 mL | INTRAVENOUS | Status: DC | PRN
  Administered 2017-09-29: 10 mL via INTRAVENOUS
  Filled 2017-09-29: qty 10

## 2017-09-29 MED ORDER — SODIUM CHLORIDE 0.9 % IV SOLN
Freq: Once | INTRAVENOUS | Status: AC
  Administered 2017-09-29: 14:00:00 via INTRAVENOUS

## 2017-09-29 MED ORDER — ACETAMINOPHEN 325 MG PO TABS
650.0000 mg | ORAL_TABLET | Freq: Once | ORAL | Status: AC
  Administered 2017-09-29: 650 mg via ORAL

## 2017-09-29 MED ORDER — DIPHENHYDRAMINE HCL 25 MG PO CAPS
ORAL_CAPSULE | ORAL | Status: AC
  Filled 2017-09-29: qty 2

## 2017-09-29 MED ORDER — ACETAMINOPHEN 325 MG PO TABS
ORAL_TABLET | ORAL | Status: AC
  Filled 2017-09-29: qty 2

## 2017-09-29 MED ORDER — SODIUM CHLORIDE 0.9 % IV SOLN
840.0000 mg | Freq: Once | INTRAVENOUS | Status: AC
  Administered 2017-09-29: 840 mg via INTRAVENOUS
  Filled 2017-09-29: qty 28

## 2017-09-29 MED ORDER — DIPHENHYDRAMINE HCL 25 MG PO CAPS
50.0000 mg | ORAL_CAPSULE | Freq: Once | ORAL | Status: AC
  Administered 2017-09-29: 50 mg via ORAL

## 2017-09-29 MED ORDER — HEPARIN SOD (PORK) LOCK FLUSH 100 UNIT/ML IV SOLN
500.0000 [IU] | Freq: Once | INTRAVENOUS | Status: AC
Start: 2017-09-29 — End: 2017-09-29
  Administered 2017-09-29: 500 [IU] via INTRAVENOUS
  Filled 2017-09-29: qty 5

## 2017-09-29 NOTE — Patient Instructions (Signed)
[facility] Discharge Instructions for Patients Receiving Chemotherapy  Today you received the following chemotherapy agents: Perjeta  To help prevent nausea and vomiting after your treatment, we encourage you to take your nausea medication as prescribed.    If you develop nausea and vomiting that is not controlled by your nausea medication, call the clinic.   BELOW ARE SYMPTOMS THAT SHOULD BE REPORTED IMMEDIATELY:  *FEVER GREATER THAN 100.5 F  *CHILLS WITH OR WITHOUT FEVER  NAUSEA AND VOMITING THAT IS NOT CONTROLLED WITH YOUR NAUSEA MEDICATION  *UNUSUAL SHORTNESS OF BREATH  *UNUSUAL BRUISING OR BLEEDING  TENDERNESS IN MOUTH AND THROAT WITH OR WITHOUT PRESENCE OF ULCERS  *URINARY PROBLEMS  *BOWEL PROBLEMS  UNUSUAL RASH Items with * indicate a potential emergency and should be followed up as soon as possible.  Feel free to call the clinic should you have any questions or concerns. The clinic phone number is [phone].  Please show the [facility] at check-in to the Emergency Department and triage nurse.  Pertuzumab injection What is this medicine? PERTUZUMAB (per TOOZ ue mab) is a monoclonal antibody. It is used to treat breast cancer. This medicine may be used for other purposes; ask your health care provider or pharmacist if you have questions. COMMON BRAND NAME(S): PERJETA What should I tell my health care provider before I take this medicine? They need to know if you have any of these conditions: -heart disease -heart failure -high blood pressure -history of irregular heart beat -recent or ongoing radiation therapy -an unusual or allergic reaction to pertuzumab, other medicines, foods, dyes, or preservatives -pregnant or trying to get pregnant -breast-feeding How should I use this medicine? This medicine is for infusion into a vein. It is given by a health care professional in a hospital or clinic setting. Talk to your pediatrician  regarding the use of this medicine in children. Special care may be needed. Overdosage: If you think you have taken too much of this medicine contact a poison control center or emergency room at once. NOTE: This medicine is only for you. Do not share this medicine with others. What if I miss a dose? It is important not to miss your dose. Call your doctor or health care professional if you are unable to keep an appointment. What may interact with this medicine? Interactions are not expected. Give your health care provider a list of all the medicines, herbs, non-prescription drugs, or dietary supplements you use. Also tell them if you smoke, drink alcohol, or use illegal drugs. Some items may interact with your medicine. This list may not describe all possible interactions. Give your health care provider a list of all the medicines, herbs, non-prescription drugs, or dietary supplements you use. Also tell them if you smoke, drink alcohol, or use illegal drugs. Some items may interact with your medicine. What should I watch for while using this medicine? Your condition will be monitored carefully while you are receiving this medicine. Report any side effects. Continue your course of treatment even though you feel ill unless your doctor tells you to stop. Do not become pregnant while taking this medicine or for 7 months after stopping it. Women should inform their doctor if they wish to become pregnant or think they might be pregnant. Women of child-bearing potential will need to have a negative pregnancy test before starting this medicine. There is a potential for serious side effects to an unborn child. Talk to your health care professional or pharmacist for  more information. Do not breast-feed an infant while taking this medicine or for 7 months after stopping it. Women must use effective birth control with this medicine. Call your doctor or health care professional for advice if you get a fever, chills  or sore throat, or other symptoms of a cold or flu. Do not treat yourself. Try to avoid being around people who are sick. You may experience fever, chills, and headache during the infusion. Report any side effects during the infusion to your health care professional. What side effects may I notice from receiving this medicine? Side effects that you should report to your doctor or health care professional as soon as possible: -breathing problems -chest pain or palpitations -dizziness -feeling faint or lightheaded -fever or chills -skin rash, itching or hives -sore throat -swelling of the face, lips, or tongue -swelling of the legs or ankles -unusually weak or tired Side effects that usually do not require medical attention (report to your doctor or health care professional if they continue or are bothersome): -diarrhea -hair loss -nausea, vomiting -tiredness This list may not describe all possible side effects. Call your doctor for medical advice about side effects. You may report side effects to FDA at 1-800-FDA-1088. Where should I keep my medicine? This drug is given in a hospital or clinic and will not be stored at home. NOTE: This sheet is a summary. It may not cover all possible information. If you have questions about this medicine, talk to your doctor, pharmacist, or health care provider.  2018 Elsevier/Gold Standard (2015-11-05 12:08:50)

## 2017-10-06 ENCOUNTER — Ambulatory Visit: Payer: 59

## 2017-10-19 DIAGNOSIS — B0229 Other postherpetic nervous system involvement: Secondary | ICD-10-CM | POA: Insufficient documentation

## 2017-10-20 ENCOUNTER — Ambulatory Visit (HOSPITAL_BASED_OUTPATIENT_CLINIC_OR_DEPARTMENT_OTHER): Payer: Medicare Other

## 2017-10-20 VITALS — BP 156/83 | HR 83 | Temp 98.3°F | Resp 18

## 2017-10-20 DIAGNOSIS — Z5111 Encounter for antineoplastic chemotherapy: Secondary | ICD-10-CM | POA: Diagnosis not present

## 2017-10-20 DIAGNOSIS — Z5112 Encounter for antineoplastic immunotherapy: Secondary | ICD-10-CM | POA: Diagnosis present

## 2017-10-20 DIAGNOSIS — C773 Secondary and unspecified malignant neoplasm of axilla and upper limb lymph nodes: Secondary | ICD-10-CM

## 2017-10-20 DIAGNOSIS — C792 Secondary malignant neoplasm of skin: Secondary | ICD-10-CM

## 2017-10-20 DIAGNOSIS — C7951 Secondary malignant neoplasm of bone: Secondary | ICD-10-CM | POA: Diagnosis not present

## 2017-10-20 DIAGNOSIS — Z170 Estrogen receptor positive status [ER+]: Secondary | ICD-10-CM

## 2017-10-20 DIAGNOSIS — C50412 Malignant neoplasm of upper-outer quadrant of left female breast: Secondary | ICD-10-CM | POA: Diagnosis not present

## 2017-10-20 MED ORDER — ACETAMINOPHEN 325 MG PO TABS
650.0000 mg | ORAL_TABLET | Freq: Once | ORAL | Status: AC
  Administered 2017-10-20: 650 mg via ORAL

## 2017-10-20 MED ORDER — FULVESTRANT 250 MG/5ML IM SOLN
INTRAMUSCULAR | Status: AC
  Filled 2017-10-20: qty 5

## 2017-10-20 MED ORDER — ACETAMINOPHEN 325 MG PO TABS
ORAL_TABLET | ORAL | Status: AC
  Filled 2017-10-20: qty 2

## 2017-10-20 MED ORDER — DIPHENHYDRAMINE HCL 25 MG PO CAPS
50.0000 mg | ORAL_CAPSULE | Freq: Once | ORAL | Status: AC
  Administered 2017-10-20: 50 mg via ORAL

## 2017-10-20 MED ORDER — HEPARIN SOD (PORK) LOCK FLUSH 100 UNIT/ML IV SOLN
500.0000 [IU] | Freq: Once | INTRAVENOUS | Status: AC | PRN
  Administered 2017-10-20: 500 [IU]
  Filled 2017-10-20: qty 5

## 2017-10-20 MED ORDER — ZOLEDRONIC ACID 4 MG/100ML IV SOLN
4.0000 mg | Freq: Once | INTRAVENOUS | Status: AC
  Administered 2017-10-20: 4 mg via INTRAVENOUS
  Filled 2017-10-20: qty 100

## 2017-10-20 MED ORDER — DIPHENHYDRAMINE HCL 25 MG PO CAPS
ORAL_CAPSULE | ORAL | Status: AC
  Filled 2017-10-20: qty 2

## 2017-10-20 MED ORDER — SODIUM CHLORIDE 0.9 % IV SOLN
Freq: Once | INTRAVENOUS | Status: AC
  Administered 2017-10-20: 08:00:00 via INTRAVENOUS

## 2017-10-20 MED ORDER — FULVESTRANT 250 MG/5ML IM SOLN: 500.0000 mg | INTRAMUSCULAR | Status: DC

## 2017-10-20 MED ORDER — SODIUM CHLORIDE 0.9% FLUSH
10.0000 mL | INTRAVENOUS | Status: DC | PRN
  Administered 2017-10-20: 10 mL
  Filled 2017-10-20: qty 10

## 2017-10-20 MED ORDER — FULVESTRANT 250 MG/5ML IM SOLN
500.0000 mg | Freq: Once | INTRAMUSCULAR | Status: AC
  Administered 2017-10-20: 500 mg via INTRAMUSCULAR

## 2017-10-20 MED ORDER — SODIUM CHLORIDE 0.9 % IV SOLN
450.0000 mg | Freq: Once | INTRAVENOUS | Status: AC
  Administered 2017-10-20: 450 mg via INTRAVENOUS
  Filled 2017-10-20: qty 21.43

## 2017-10-20 MED ORDER — SODIUM CHLORIDE 0.9 % IV SOLN
420.0000 mg | Freq: Once | INTRAVENOUS | Status: AC
  Administered 2017-10-20: 420 mg via INTRAVENOUS
  Filled 2017-10-20: qty 14

## 2017-10-20 NOTE — Patient Instructions (Signed)
[facility] Discharge Instructions for Patients Receiving Chemotherapy  Today you received the following chemotherapy agents Herceptin, perjeta ,Zometa and Faslodex  To help prevent nausea and vomiting after your treatment, we encourage you to take your nausea medication as directed If you develop nausea and vomiting that is not controlled by your nausea medication, call the clinic.   BELOW ARE SYMPTOMS THAT SHOULD BE REPORTED IMMEDIATELY:  *FEVER GREATER THAN 100.5 F  *CHILLS WITH OR WITHOUT FEVER  NAUSEA AND VOMITING THAT IS NOT CONTROLLED WITH YOUR NAUSEA MEDICATION  *UNUSUAL SHORTNESS OF BREATH  *UNUSUAL BRUISING OR BLEEDING  TENDERNESS IN MOUTH AND THROAT WITH OR WITHOUT PRESENCE OF ULCERS  *URINARY PROBLEMS  *BOWEL PROBLEMS  UNUSUAL RASH Items with * indicate a potential emergency and should be followed up as soon as possible.  Feel free to call the clinic should you have any questions or concerns. The clinic phone number is [phone].  Please show the [facility] at check-in to the Emergency Department and triage nurse.

## 2017-10-26 ENCOUNTER — Telehealth: Payer: Self-pay

## 2017-10-26 NOTE — Telephone Encounter (Signed)
Called pt to return her vm regarding getting dental check up. Pt wanted to know if she is allowed to go ahead and make a dentist appointment soon. Discussed with Dr.Gudena and is okay for pt to make her dental appointment for cleaning and filling. Pt will need to refrain from getting any major dental work, such as, extractions or root canals while on treatment. Called and lvm with call number for pt to confirm receipt of message.

## 2017-11-13 MED ORDER — PALONOSETRON HCL INJECTION 0.25 MG/5ML
INTRAVENOUS | Status: AC
  Filled 2017-11-13: qty 5

## 2017-11-13 MED ORDER — DIPHENHYDRAMINE HCL 50 MG/ML IJ SOLN
INTRAMUSCULAR | Status: AC
  Filled 2017-11-13: qty 1

## 2017-11-13 MED ORDER — FAMOTIDINE IN NACL 20-0.9 MG/50ML-% IV SOLN
INTRAVENOUS | Status: AC
  Filled 2017-11-13: qty 50

## 2017-11-15 ENCOUNTER — Telehealth: Payer: Self-pay | Admitting: *Deleted

## 2017-11-15 NOTE — Telephone Encounter (Signed)
"  I'm scheduled for treatment Friday.  Shingles started two months ago are finally going away.  Will treatment be cancelled because I've cancelled echocardiogram twice?"  Echocardiogram results in EMR dated 09-22-2017.  Next echo due March or June 2019 depending on type of breast cancer.  No further questions or needs at this time.

## 2017-11-17 ENCOUNTER — Inpatient Hospital Stay: Payer: Medicare Other | Attending: Hematology and Oncology

## 2017-11-17 ENCOUNTER — Other Ambulatory Visit: Payer: Self-pay

## 2017-11-17 ENCOUNTER — Other Ambulatory Visit: Payer: Medicare Other

## 2017-11-17 VITALS — BP 143/83 | HR 77 | Temp 98.6°F | Resp 18

## 2017-11-17 DIAGNOSIS — C50412 Malignant neoplasm of upper-outer quadrant of left female breast: Secondary | ICD-10-CM | POA: Insufficient documentation

## 2017-11-17 DIAGNOSIS — Z5112 Encounter for antineoplastic immunotherapy: Secondary | ICD-10-CM | POA: Insufficient documentation

## 2017-11-17 DIAGNOSIS — C7951 Secondary malignant neoplasm of bone: Secondary | ICD-10-CM | POA: Insufficient documentation

## 2017-11-17 DIAGNOSIS — Z170 Estrogen receptor positive status [ER+]: Secondary | ICD-10-CM | POA: Diagnosis not present

## 2017-11-17 DIAGNOSIS — Z79818 Long term (current) use of other agents affecting estrogen receptors and estrogen levels: Secondary | ICD-10-CM | POA: Diagnosis not present

## 2017-11-17 MED ORDER — TRASTUZUMAB CHEMO 150 MG IV SOLR
450.0000 mg | Freq: Once | INTRAVENOUS | Status: AC
  Administered 2017-11-17: 450 mg via INTRAVENOUS
  Filled 2017-11-17: qty 21.43

## 2017-11-17 MED ORDER — SODIUM CHLORIDE 0.9% FLUSH
10.0000 mL | INTRAVENOUS | Status: DC | PRN
  Administered 2017-11-17: 10 mL
  Filled 2017-11-17: qty 10

## 2017-11-17 MED ORDER — HEPARIN SOD (PORK) LOCK FLUSH 100 UNIT/ML IV SOLN
500.0000 [IU] | Freq: Once | INTRAVENOUS | Status: AC | PRN
  Administered 2017-11-17: 500 [IU]
  Filled 2017-11-17: qty 5

## 2017-11-17 MED ORDER — FULVESTRANT 250 MG/5ML IM SOLN
INTRAMUSCULAR | Status: AC
  Filled 2017-11-17: qty 5

## 2017-11-17 MED ORDER — FULVESTRANT 250 MG/5ML IM SOLN
500.0000 mg | Freq: Once | INTRAMUSCULAR | Status: AC
  Administered 2017-11-17: 500 mg via INTRAMUSCULAR

## 2017-11-17 MED ORDER — ACETAMINOPHEN 325 MG PO TABS
650.0000 mg | ORAL_TABLET | Freq: Once | ORAL | Status: AC
  Administered 2017-11-17: 650 mg via ORAL

## 2017-11-17 MED ORDER — SODIUM CHLORIDE 0.9 % IV SOLN
Freq: Once | INTRAVENOUS | Status: AC
  Administered 2017-11-17: 10:00:00 via INTRAVENOUS

## 2017-11-17 MED ORDER — DIPHENHYDRAMINE HCL 25 MG PO CAPS
ORAL_CAPSULE | ORAL | Status: AC
  Filled 2017-11-17: qty 2

## 2017-11-17 MED ORDER — SODIUM CHLORIDE 0.9 % IV SOLN
420.0000 mg | Freq: Once | INTRAVENOUS | Status: AC
  Administered 2017-11-17: 420 mg via INTRAVENOUS
  Filled 2017-11-17: qty 14

## 2017-11-17 MED ORDER — ACETAMINOPHEN 325 MG PO TABS
ORAL_TABLET | ORAL | Status: AC
  Filled 2017-11-17: qty 2

## 2017-11-17 MED ORDER — DIPHENHYDRAMINE HCL 25 MG PO CAPS
50.0000 mg | ORAL_CAPSULE | Freq: Once | ORAL | Status: AC
  Administered 2017-11-17: 50 mg via ORAL

## 2017-11-17 NOTE — Patient Instructions (Signed)
[facility] Discharge Instructions for Patients Receiving Chemotherapy  (Herceptin)  Today you received the following chemotherapy agents: Herceptin and Perjeta To help prevent nausea and vomiting after your treatment, we encourage you to take your nausea medication as directed.    If you develop nausea and vomiting that is not controlled by your nausea medication, call the clinic.   BELOW ARE SYMPTOMS THAT SHOULD BE REPORTED IMMEDIATELY:  *FEVER GREATER THAN 100.5 F  *CHILLS WITH OR WITHOUT FEVER  NAUSEA AND VOMITING THAT IS NOT CONTROLLED WITH YOUR NAUSEA MEDICATION  *UNUSUAL SHORTNESS OF BREATH  *UNUSUAL BRUISING OR BLEEDING  TENDERNESS IN MOUTH AND THROAT WITH OR WITHOUT PRESENCE OF ULCERS  *URINARY PROBLEMS  *BOWEL PROBLEMS  UNUSUAL RASH Items with * indicate a potential emergency and should be followed up as soon as possible.  Feel free to call the clinic you have any questions or concerns. The clinic phone number is [phone].  Please show the [facility] at check-in to the Emergency Department and triage nurse.  Fulvestrant injection (Faslodex) What is this medicine? FULVESTRANT (ful VES trant) blocks the effects of estrogen. It is used to treat breast cancer. This medicine may be used for other purposes; ask your health care provider or pharmacist if you have questions. COMMON BRAND NAME(S): FASLODEX What should I tell my health care provider before I take this medicine? They need to know if you have any of these conditions: -bleeding problems -liver disease -low levels of platelets in the blood -an unusual or allergic reaction to fulvestrant, other medicines, foods, dyes, or preservatives -pregnant or trying to get pregnant -breast-feeding How should I use this medicine? This medicine is for injection into a muscle. It is usually given by a health care professional in a hospital or clinic setting. Talk to your pediatrician regarding  the use of this medicine in children. Special care may be needed. Overdosage: If you think you have taken too much of this medicine contact a poison control center or emergency room at once. NOTE: This medicine is only for you. Do not share this medicine with others. What if I miss a dose? It is important not to miss your dose. Call your doctor or health care professional if you are unable to keep an appointment. What may interact with this medicine? -medicines that treat or prevent blood clots like warfarin, enoxaparin, and dalteparin This list may not describe all possible interactions. Give your health care provider a list of all the medicines, herbs, non-prescription drugs, or dietary supplements you use. Also tell them if you smoke, drink alcohol, or use illegal drugs. Some items may interact with your medicine. What should I watch for while using this medicine? Your condition will be monitored carefully while you are receiving this medicine. You will need important blood work done while you are taking this medicine. Do not become pregnant while taking this medicine or for at least 1 year after stopping it. Women of child-bearing potential will need to have a negative pregnancy test before starting this medicine. Women should inform their doctor if they wish to become pregnant or think they might be pregnant. There is a potential for serious side effects to an unborn child. Men should inform their doctors if they wish to father a child. This medicine may lower sperm counts. Talk to your health care professional or pharmacist for more information. Do not breast-feed an infant while taking this medicine or for 1 year after the last dose. What  side effects may I notice from receiving this medicine? Side effects that you should report to your doctor or health care professional as soon as possible: -allergic reactions like skin rash, itching or hives, swelling of the face, lips, or tongue -feeling  faint or lightheaded, falls -pain, tingling, numbness, or weakness in the legs -signs and symptoms of infection like fever or chills; cough; flu-like symptoms; sore throat -vaginal bleeding Side effects that usually do not require medical attention (report to your doctor or health care professional if they continue or are bothersome): -aches, pains -constipation -diarrhea -headache -hot flashes -nausea, vomiting -pain at site where injected -stomach pain This list may not describe all possible side effects. Call your doctor for medical advice about side effects. You may report side effects to FDA at 1-800-FDA-1088. Where should I keep my medicine? This drug is given in a hospital or clinic and will not be stored at home. NOTE: This sheet is a summary. It may not cover all possible information. If you have questions about this medicine, talk to your doctor, pharmacist, or health care provider.  2018 Elsevier/Gold Standard (2015-05-01 11:03:55)

## 2017-12-15 ENCOUNTER — Inpatient Hospital Stay: Payer: Medicare Other | Attending: Hematology and Oncology

## 2017-12-15 ENCOUNTER — Inpatient Hospital Stay: Payer: Medicare Other

## 2017-12-15 VITALS — BP 147/75 | HR 77 | Temp 98.2°F | Resp 18 | Wt 153.0 lb

## 2017-12-15 DIAGNOSIS — Z170 Estrogen receptor positive status [ER+]: Secondary | ICD-10-CM

## 2017-12-15 DIAGNOSIS — Z5112 Encounter for antineoplastic immunotherapy: Secondary | ICD-10-CM | POA: Insufficient documentation

## 2017-12-15 DIAGNOSIS — C50412 Malignant neoplasm of upper-outer quadrant of left female breast: Secondary | ICD-10-CM

## 2017-12-15 DIAGNOSIS — Z79899 Other long term (current) drug therapy: Secondary | ICD-10-CM | POA: Diagnosis not present

## 2017-12-15 DIAGNOSIS — C7951 Secondary malignant neoplasm of bone: Secondary | ICD-10-CM

## 2017-12-15 DIAGNOSIS — Z79818 Long term (current) use of other agents affecting estrogen receptors and estrogen levels: Secondary | ICD-10-CM | POA: Insufficient documentation

## 2017-12-15 LAB — CMP (CANCER CENTER ONLY)
ALT: 23 U/L (ref 0–55)
AST: 33 U/L (ref 5–34)
Albumin: 4 g/dL (ref 3.5–5.0)
Alkaline Phosphatase: 161 U/L — ABNORMAL HIGH (ref 40–150)
Anion gap: 11 (ref 3–11)
BUN: 13 mg/dL (ref 7–26)
CO2: 26 mmol/L (ref 22–29)
Calcium: 9.6 mg/dL (ref 8.4–10.4)
Chloride: 103 mmol/L (ref 98–109)
Creatinine: 0.7 mg/dL (ref 0.60–1.10)
GFR, Est AFR Am: >60 mL/min (ref >60)
GFR, Estimated: >60 mL/min (ref >60)
Glucose, Bld: 99 mg/dL (ref 70–140)
Potassium: 3.6 mmol/L (ref 3.5–5.1)
Sodium: 140 mmol/L (ref 136–145)
Total Bilirubin: 0.5 mg/dL (ref 0.2–1.2)
Total Protein: 7.2 g/dL (ref 6.4–8.3)

## 2017-12-15 LAB — CBC WITH DIFFERENTIAL (CANCER CENTER ONLY)
Basophils Absolute: 0 10*3/uL (ref 0.0–0.1)
Basophils Relative: 1 %
Eosinophils Absolute: 0.2 10*3/uL (ref 0.0–0.5)
Eosinophils Relative: 3 %
HCT: 36.5 % (ref 34.8–46.6)
Hemoglobin: 12.2 g/dL (ref 11.6–15.9)
Lymphocytes Relative: 34 %
Lymphs Abs: 2 10*3/uL (ref 0.9–3.3)
MCH: 31.5 pg (ref 25.1–34.0)
MCHC: 33.5 g/dL (ref 31.5–36.0)
MCV: 94 fL (ref 79.5–101.0)
Monocytes Absolute: 0.6 10*3/uL (ref 0.1–0.9)
Monocytes Relative: 11 %
Neutro Abs: 3 10*3/uL (ref 1.5–6.5)
Neutrophils Relative %: 51 %
Platelet Count: 262 10*3/uL (ref 145–400)
RBC: 3.88 MIL/uL (ref 3.70–5.45)
RDW: 12.8 % (ref 11.2–14.5)
WBC Count: 5.8 10*3/uL (ref 3.9–10.3)

## 2017-12-15 MED ORDER — DIPHENHYDRAMINE HCL 25 MG PO CAPS
50.0000 mg | ORAL_CAPSULE | Freq: Once | ORAL | Status: AC
  Administered 2017-12-15: 50 mg via ORAL

## 2017-12-15 MED ORDER — TRASTUZUMAB CHEMO 150 MG IV SOLR
450.0000 mg | Freq: Once | INTRAVENOUS | Status: AC
  Administered 2017-12-15: 450 mg via INTRAVENOUS
  Filled 2017-12-15: qty 21.43

## 2017-12-15 MED ORDER — SODIUM CHLORIDE 0.9 % IV SOLN
Freq: Once | INTRAVENOUS | Status: AC
  Administered 2017-12-15: 09:00:00 via INTRAVENOUS

## 2017-12-15 MED ORDER — SODIUM CHLORIDE 0.9% FLUSH
10.0000 mL | INTRAVENOUS | Status: DC | PRN
  Administered 2017-12-15: 10 mL via INTRAVENOUS
  Filled 2017-12-15: qty 10

## 2017-12-15 MED ORDER — HEPARIN SOD (PORK) LOCK FLUSH 100 UNIT/ML IV SOLN
500.0000 [IU] | Freq: Once | INTRAVENOUS | Status: AC | PRN
  Administered 2017-12-15: 500 [IU]
  Filled 2017-12-15: qty 5

## 2017-12-15 MED ORDER — ACETAMINOPHEN 325 MG PO TABS
ORAL_TABLET | ORAL | Status: AC
  Filled 2017-12-15: qty 2

## 2017-12-15 MED ORDER — ZOLEDRONIC ACID 4 MG/100ML IV SOLN
4.0000 mg | Freq: Once | INTRAVENOUS | Status: AC
  Administered 2017-12-15: 4 mg via INTRAVENOUS
  Filled 2017-12-15: qty 100

## 2017-12-15 MED ORDER — FULVESTRANT 250 MG/5ML IM SOLN
INTRAMUSCULAR | Status: AC
  Filled 2017-12-15: qty 5

## 2017-12-15 MED ORDER — SODIUM CHLORIDE 0.9% FLUSH
10.0000 mL | INTRAVENOUS | Status: DC | PRN
  Administered 2017-12-15: 10 mL
  Filled 2017-12-15: qty 10

## 2017-12-15 MED ORDER — FULVESTRANT 250 MG/5ML IM SOLN
500.0000 mg | Freq: Once | INTRAMUSCULAR | Status: AC
  Administered 2017-12-15: 500 mg via INTRAMUSCULAR

## 2017-12-15 MED ORDER — SODIUM CHLORIDE 0.9 % IV SOLN
420.0000 mg | Freq: Once | INTRAVENOUS | Status: AC
  Administered 2017-12-15: 420 mg via INTRAVENOUS
  Filled 2017-12-15: qty 14

## 2017-12-15 MED ORDER — DIPHENHYDRAMINE HCL 25 MG PO CAPS
ORAL_CAPSULE | ORAL | Status: AC
  Filled 2017-12-15: qty 2

## 2017-12-15 MED ORDER — ACETAMINOPHEN 325 MG PO TABS
650.0000 mg | ORAL_TABLET | Freq: Once | ORAL | Status: AC
  Administered 2017-12-15: 650 mg via ORAL

## 2017-12-15 NOTE — Patient Instructions (Signed)
Implanted Port Home Guide An implanted port is a type of central line that is placed under the skin. Central lines are used to provide IV access when treatment or nutrition needs to be given through a person's veins. Implanted ports are used for long-term IV access. An implanted port may be placed because:  You need IV medicine that would be irritating to the small veins in your hands or arms.  You need long-term IV medicines, such as antibiotics.  You need IV nutrition for a long period.  You need frequent blood draws for lab tests.  You need dialysis.  Implanted ports are usually placed in the chest area, but they can also be placed in the upper arm, the abdomen, or the leg. An implanted port has two main parts:  Reservoir. The reservoir is round and will appear as a small, raised area under your skin. The reservoir is the part where a needle is inserted to give medicines or draw blood.  Catheter. The catheter is a thin, flexible tube that extends from the reservoir. The catheter is placed into a large vein. Medicine that is inserted into the reservoir goes into the catheter and then into the vein.  How will I care for my incision site? Do not get the incision site wet. Bathe or shower as directed by your health care provider. How is my port accessed? Special steps must be taken to access the port:  Before the port is accessed, a numbing cream can be placed on the skin. This helps numb the skin over the port site.  Your health care provider uses a sterile technique to access the port. ? Your health care provider must put on a mask and sterile gloves. ? The skin over your port is cleaned carefully with an antiseptic and allowed to dry. ? The port is gently pinched between sterile gloves, and a needle is inserted into the port.  Only "non-coring" port needles should be used to access the port. Once the port is accessed, a blood return should be checked. This helps ensure that the port  is in the vein and is not clogged.  If your port needs to remain accessed for a constant infusion, a clear (transparent) bandage will be placed over the needle site. The bandage and needle will need to be changed every week, or as directed by your health care provider.  Keep the bandage covering the needle clean and dry. Do not get it wet. Follow your health care provider's instructions on how to take a shower or bath while the port is accessed.  If your port does not need to stay accessed, no bandage is needed over the port.  What is flushing? Flushing helps keep the port from getting clogged. Follow your health care provider's instructions on how and when to flush the port. Ports are usually flushed with saline solution or a medicine called heparin. The need for flushing will depend on how the port is used.  If the port is used for intermittent medicines or blood draws, the port will need to be flushed: ? After medicines have been given. ? After blood has been drawn. ? As part of routine maintenance.  If a constant infusion is running, the port may not need to be flushed.  How long will my port stay implanted? The port can stay in for as long as your health care provider thinks it is needed. When it is time for the port to come out, surgery will be   done to remove it. The procedure is similar to the one performed when the port was put in. When should I seek immediate medical care? When you have an implanted port, you should seek immediate medical care if:  You notice a bad smell coming from the incision site.  You have swelling, redness, or drainage at the incision site.  You have more swelling or pain at the port site or the surrounding area.  You have a fever that is not controlled with medicine.  This information is not intended to replace advice given to you by your health care provider. Make sure you discuss any questions you have with your health care provider. Document  Released: 10/03/2005 Document Revised: 03/10/2016 Document Reviewed: 06/10/2013 Elsevier Interactive Patient Education  2017 Elsevier Inc.  

## 2017-12-15 NOTE — Patient Instructions (Signed)
[facility] Discharge Instructions for Patients Receiving Chemotherapy  Today you received the following chemotherapy agents Herceptin, Perjeta, Faslodex  To help prevent nausea and vomiting after your treatment, we encourage you to take your nausea medication as directed   If you develop nausea and vomiting that is not controlled by your nausea medication, call the clinic.   BELOW ARE SYMPTOMS THAT SHOULD BE REPORTED IMMEDIATELY:  *FEVER GREATER THAN 100.5 F  *CHILLS WITH OR WITHOUT FEVER  NAUSEA AND VOMITING THAT IS NOT CONTROLLED WITH YOUR NAUSEA MEDICATION  *UNUSUAL SHORTNESS OF BREATH  *UNUSUAL BRUISING OR BLEEDING  TENDERNESS IN MOUTH AND THROAT WITH OR WITHOUT PRESENCE OF ULCERS  *URINARY PROBLEMS  *BOWEL PROBLEMS  UNUSUAL RASH Items with * indicate a potential emergency and should be followed up as soon as possible.  Feel free to call the clinic should you have any questions or concerns. The clinic phone number is [phone].  Please show the CHEMO ALERT CARD at check-in to the Emergency Department and triage nurse.   

## 2018-01-12 ENCOUNTER — Inpatient Hospital Stay: Payer: Medicare Other

## 2018-01-12 ENCOUNTER — Other Ambulatory Visit: Payer: Self-pay

## 2018-01-12 ENCOUNTER — Inpatient Hospital Stay (HOSPITAL_BASED_OUTPATIENT_CLINIC_OR_DEPARTMENT_OTHER): Payer: Medicare Other | Admitting: Hematology and Oncology

## 2018-01-12 VITALS — BP 153/89 | HR 75 | Temp 98.5°F | Resp 17

## 2018-01-12 DIAGNOSIS — C50412 Malignant neoplasm of upper-outer quadrant of left female breast: Secondary | ICD-10-CM

## 2018-01-12 DIAGNOSIS — Z79899 Other long term (current) drug therapy: Secondary | ICD-10-CM

## 2018-01-12 DIAGNOSIS — Z5112 Encounter for antineoplastic immunotherapy: Secondary | ICD-10-CM | POA: Diagnosis not present

## 2018-01-12 DIAGNOSIS — Z5181 Encounter for therapeutic drug level monitoring: Secondary | ICD-10-CM

## 2018-01-12 DIAGNOSIS — C7951 Secondary malignant neoplasm of bone: Secondary | ICD-10-CM

## 2018-01-12 DIAGNOSIS — Z170 Estrogen receptor positive status [ER+]: Secondary | ICD-10-CM

## 2018-01-12 DIAGNOSIS — Z79818 Long term (current) use of other agents affecting estrogen receptors and estrogen levels: Secondary | ICD-10-CM

## 2018-01-12 LAB — CBC WITH DIFFERENTIAL/PLATELET
Basophils Absolute: 0 10*3/uL (ref 0.0–0.1)
Basophils Relative: 1 %
Eosinophils Absolute: 0.1 10*3/uL (ref 0.0–0.5)
Eosinophils Relative: 2 %
HCT: 36.4 % (ref 34.8–46.6)
Hemoglobin: 12.3 g/dL (ref 11.6–15.9)
Lymphocytes Relative: 32 %
Lymphs Abs: 1.8 10*3/uL (ref 0.9–3.3)
MCH: 31.3 pg (ref 25.1–34.0)
MCHC: 33.8 g/dL (ref 31.5–36.0)
MCV: 92.7 fL (ref 79.5–101.0)
Monocytes Absolute: 0.6 10*3/uL (ref 0.1–0.9)
Monocytes Relative: 11 %
Neutro Abs: 3.1 10*3/uL (ref 1.5–6.5)
Neutrophils Relative %: 54 %
Platelets: 277 10*3/uL (ref 145–400)
RBC: 3.93 MIL/uL (ref 3.70–5.45)
RDW: 12.7 % (ref 11.2–14.5)
WBC: 5.7 10*3/uL (ref 3.9–10.3)

## 2018-01-12 LAB — COMPREHENSIVE METABOLIC PANEL
ALT: 19 U/L (ref 0–55)
AST: 31 U/L (ref 5–34)
Albumin: 4.1 g/dL (ref 3.5–5.0)
Alkaline Phosphatase: 134 U/L (ref 40–150)
Anion gap: 8 (ref 3–11)
BUN: 12 mg/dL (ref 7–26)
CO2: 27 mmol/L (ref 22–29)
Calcium: 9.6 mg/dL (ref 8.4–10.4)
Chloride: 104 mmol/L (ref 98–109)
Creatinine, Ser: 0.69 mg/dL (ref 0.60–1.10)
GFR calc Af Amer: >60 mL/min (ref >60)
GFR calc non Af Amer: >60 mL/min (ref >60)
Glucose, Bld: 109 mg/dL (ref 70–140)
Potassium: 3.5 mmol/L (ref 3.5–5.1)
Sodium: 139 mmol/L (ref 136–145)
Total Bilirubin: 0.4 mg/dL (ref 0.2–1.2)
Total Protein: 7.4 g/dL (ref 6.4–8.3)

## 2018-01-12 MED ORDER — HEPARIN SOD (PORK) LOCK FLUSH 100 UNIT/ML IV SOLN
500.0000 [IU] | Freq: Once | INTRAVENOUS | Status: AC | PRN
  Administered 2018-01-12: 500 [IU]
  Filled 2018-01-12: qty 5

## 2018-01-12 MED ORDER — ACETAMINOPHEN 325 MG PO TABS
ORAL_TABLET | ORAL | Status: AC
  Filled 2018-01-12: qty 2

## 2018-01-12 MED ORDER — FULVESTRANT 250 MG/5ML IM SOLN
500.0000 mg | INTRAMUSCULAR | Status: DC
  Administered 2018-01-12: 500 mg via INTRAMUSCULAR

## 2018-01-12 MED ORDER — ACETAMINOPHEN 325 MG PO TABS
650.0000 mg | ORAL_TABLET | Freq: Once | ORAL | Status: AC
  Administered 2018-01-12: 650 mg via ORAL

## 2018-01-12 MED ORDER — SODIUM CHLORIDE 0.9 % IV SOLN
420.0000 mg | Freq: Once | INTRAVENOUS | Status: AC
  Administered 2018-01-12: 420 mg via INTRAVENOUS
  Filled 2018-01-12: qty 14

## 2018-01-12 MED ORDER — DIPHENHYDRAMINE HCL 25 MG PO CAPS
50.0000 mg | ORAL_CAPSULE | Freq: Once | ORAL | Status: AC
  Administered 2018-01-12: 50 mg via ORAL

## 2018-01-12 MED ORDER — TRASTUZUMAB CHEMO 150 MG IV SOLR
450.0000 mg | Freq: Once | INTRAVENOUS | Status: AC
  Administered 2018-01-12: 450 mg via INTRAVENOUS
  Filled 2018-01-12: qty 21.4

## 2018-01-12 MED ORDER — SODIUM CHLORIDE 0.9% FLUSH
10.0000 mL | INTRAVENOUS | Status: DC | PRN
  Administered 2018-01-12: 10 mL
  Filled 2018-01-12: qty 10

## 2018-01-12 MED ORDER — DIPHENHYDRAMINE HCL 25 MG PO CAPS
ORAL_CAPSULE | ORAL | Status: AC
  Filled 2018-01-12: qty 2

## 2018-01-12 MED ORDER — SODIUM CHLORIDE 0.9 % IV SOLN
Freq: Once | INTRAVENOUS | Status: AC
  Administered 2018-01-12: 10:00:00 via INTRAVENOUS

## 2018-01-12 MED ORDER — FULVESTRANT 250 MG/5ML IM SOLN
INTRAMUSCULAR | Status: AC
  Filled 2018-01-12: qty 10

## 2018-01-12 NOTE — Progress Notes (Signed)
Patient Care Team: [name], MD as PCP - General (Family Medicine)  DIAGNOSIS:  Encounter Diagnosis  Name Primary?  . Malignant neoplasm of upper-outer quadrant of left breast in female, estrogen receptor positive ([place])     SUMMARY OF ONCOLOGIC HISTORY:   Breast cancer of upper-outer quadrant of left female breast ([place])   11/14/2011 Surgery    Bilateral mastectomy, prophylactic on the right, left breast IDC 3/18 lymph nodes positive with extracapsular extension ER 89%, PR 81%, HER-2 negative, Ki-67 79% T2 N1 A. stage IIB      12/13/2011 - 06/28/2012 Chemotherapy    4 cycles of FEC followed by 4 cycles of Taxotere      07/17/2012 - 08/22/2012 Radiation Therapy    Adjuvant radiation therapy      08/22/2012 - 03/16/2016 Anti-estrogen oral therapy    Arimidex 1 mg daily      03/16/2016 Relapse/Recurrence    Subcutaneous nodule excision left chest: Infiltrating carcinoma breast primary, ER positive, PR negative      03/29/2016 Imaging    CT CAP and bone scan: Lytic lesions T8 vertebral, T1 posterior element, subcutaneous nodule left lateral chest wall, nonspecific lung nodules; Bone scan: Mets to kull, left humerus, left eighth rib, T7/T8, sternum, left acetabulum      04/28/2016 - 06/17/2017 Chemotherapy    Herceptin, lapatinib, Faslodex, Zometa every 4 weeks, lapatinib discontinued in September 2018 due to elevation of LFTs      06/22/2017 Relapse/Recurrence    Surgical excision:Soft tissue mass left lateral chest wall primary breast cancer, soft tissue mass left medial chest wall breast cancer, tumor is within the dermis extending to the subcutaneous adipose tissue and involves portions of skeletal muscle      08/2017 -  Chemotherapy    Faslodex with Herceptin and Perjeta along with Zometa every 4 weeks        CHIEF COMPLIANT: Follow-up on Faslodex along with Herceptin and Perjeta  INTERVAL HISTORY: [name] Riley is a 67 year old with above-mentioned some metastatic  breast cancer is currently on Faslodex along with Herceptin and Perjeta every 4 weeks.  She appears to be tolerating the treatment fairly well.  She does have some fatigue as well as injection site discomfort due to Faslodex.  REVIEW OF SYSTEMS:   Constitutional: Denies fevers, chills or abnormal weight loss Eyes: Denies blurriness of vision Ears, nose, mouth, throat, and face: Denies mucositis or sore throat Respiratory: Denies cough, dyspnea or wheezes Cardiovascular: Denies palpitation, chest discomfort Gastrointestinal:  Denies nausea, heartburn or change in bowel habits Skin: Denies abnormal skin rashes Lymphatics: Denies new lymphadenopathy or easy bruising Neurological:Denies numbness, tingling or new weaknesses Behavioral/Psych: Mood is stable, no new changes  Extremities: No lower extremity edema  All other systems were reviewed with the patient and are negative.  I have reviewed the past medical history, past surgical history, social history and family history with the patient and they are unchanged from previous note.  ALLERGIES:  is allergic to contrast media [iodinated diagnostic agents]; food; pravastatin; and zosyn [piperacillin sod-tazobactam so].  MEDICATIONS:  Current Outpatient Medications  Medication Sig Dispense Refill  . acetaminophen (TYLENOL) 500 MG tablet Take 1,000 mg by mouth every 6 (six) hours as needed for moderate pain.    Marland Kitchen aspirin 81 MG tablet Take 81 mg by mouth daily.      . ergocalciferol (VITAMIN D2) 50000 units capsule Take 50,000 Units by mouth every Monday.     . gabapentin (NEURONTIN) 300 MG capsule Take  1 capsule (300 mg total) at bedtime by mouth. 30 capsule 0  . levothyroxine (SYNTHROID, LEVOTHROID) 75 MCG tablet Take 75 mcg by mouth daily.     Marland Kitchen lidocaine-prilocaine (EMLA) cream Apply to affected area once (Patient taking differently: as needed (for port access). Apply to affected area once) 30 g 3  . metoprolol succinate (TOPROL-XL) 100 MG  24 hr tablet Take 1 tablet (100 mg total) by mouth daily. 90 tablet 3  . Multiple Vitamins-Minerals (MULTIVITAMIN WITH MINERALS) tablet Take 1 tablet by mouth daily.      . ondansetron (ZOFRAN-ODT) 4 MG disintegrating tablet Take 1 tablet (4 mg total) by mouth every 6 (six) hours as needed. 20 tablet 3   No current facility-administered medications for this visit.    Facility-Administered Medications Ordered in Other Visits  Medication Dose Route Frequency Provider Last Rate Last Dose  . 0.9 %  sodium chloride infusion   Intravenous Continuous Donnie Mesa, MD      . acetaminophen (TYLENOL) tablet 975 mg  975 mg Oral Q6H Donnie Mesa, MD      . enoxaparin (LOVENOX) injection 40 mg  40 mg Subcutaneous Q24H Donnie Mesa, MD      . morphine 4 MG/ML injection 2-4 mg  2-4 mg Intravenous Q2H PRN Donnie Mesa, MD      . ondansetron (ZOFRAN-ODT) disintegrating tablet 4 mg  4 mg Oral Q6H PRN Donnie Mesa, MD       Or  . ondansetron (ZOFRAN) 4 mg in sodium chloride 0.9 % 50 mL IVPB  4 mg Intravenous Q6H PRN Donnie Mesa, MD      . oxyCODONE (Oxy IR/ROXICODONE) immediate release tablet 5-10 mg  5-10 mg Oral Q4H PRN Donnie Mesa, MD      . piperacillin-tazobactam (ZOSYN) 3.375 g in dextrose 5 % 50 mL IVPB  3.375 g Intravenous Q8H Donnie Mesa, MD      . prochlorperazine (COMPAZINE) tablet 10 mg  10 mg Oral Q6H PRN Donnie Mesa, MD       Or  . prochlorperazine (COMPAZINE) injection 5-10 mg  5-10 mg Intravenous Q6H PRN Donnie Mesa, MD        PHYSICAL EXAMINATION: ECOG PERFORMANCE STATUS: 1 - Symptomatic but completely ambulatory  Vitals:   01/12/18 0923  BP: (!) 168/90  Pulse: 81  Resp: 17  Temp: 98.6 F (37 C)  SpO2: 100%   Filed Weights   01/12/18 0923  Weight: 151 lb 1.6 oz (68.5 kg)    GENERAL:alert, no distress and comfortable SKIN: skin color, texture, turgor are normal, no rashes or significant lesions EYES: normal, Conjunctiva are pink and non-injected, sclera  clear OROPHARYNX:no exudate, no erythema and lips, buccal mucosa, and tongue normal  NECK: supple, thyroid normal size, non-tender, without nodularity LYMPH:  no palpable lymphadenopathy in the cervical, axillary or inguinal LUNGS: clear to auscultation and percussion with normal breathing effort HEART: regular rate & rhythm and no murmurs and no lower extremity edema ABDOMEN:abdomen soft, non-tender and normal bowel sounds MUSCULOSKELETAL:no cyanosis of digits and no clubbing  NEURO: alert & oriented x 3 with fluent speech, no focal motor/sensory deficits EXTREMITIES: No lower extremity edema  LABORATORY DATA:  I have reviewed the data as listed CMP Latest Ref Rng & Units 12/15/2017 09/22/2017 09/11/2017  Glucose 70 - 140 mg/dL 99 80 -  BUN 7 - 26 mg/dL 13 12.8 -  Creatinine 0.60 - 1.10 mg/dL 0.70 0.6 -  Sodium 136 - 145 mmol/L 140 140 -  Potassium 3.5 -  5.1 mmol/L 3.6 3.7 -  Chloride 98 - 109 mmol/L 103 - -  CO2 22 - 29 mmol/L 26 26 -  Calcium 8.4 - 10.4 mg/dL 9.6 9.1 -  Total Protein 6.4 - 8.3 g/dL 7.2 7.0 7.1  Total Bilirubin 0.2 - 1.2 mg/dL 0.5 0.41 0.5  Alkaline Phos 40 - 150 U/L 161(H) 140 146(H)  AST 5 - 34 U/L 33 37(H) 34  ALT 0 - 55 U/L 23 31 29     Lab Results  Component Value Date   WBC 5.7 01/12/2018   HGB 12.3 01/12/2018   HCT 36.4 01/12/2018   MCV 92.7 01/12/2018   PLT 277 01/12/2018   NEUTROABS 3.1 01/12/2018    ASSESSMENT & PLAN:  Breast cancer of upper-outer quadrant of left female breast ([place]) Left breast invasive ductal carcinomaT2, N1, 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 Was on Arimidex since 08/22/2012 to 03/30/16  Subcutaneous nodule excisionleft chest: Infiltrating carcinoma breast primary, ER positive, PR negative, HER-2 positive  009/03/2017:Left chest wall nodule excision recurrent breast cancer     CT CAP: 08/11/17: Open wound. Diffuse bone mets. Bone Scan 08/11/17: Progression. Left femur mets are new. Existing lesions show inc activity  Current treatment: Herceptin and Perjeta (added 09/22/2017 ) Q 4 weeks, Faslodex, Zometa, started 04/28/2016   Toxicities: 1.  Shingles: Treated with Valtrex with improvement; 2. monitoring closely for cardiac toxicities 3.  We will need to monitor for diarrhea related to Perjeta  Return to clinic in 1 month for Herceptin and Perjeta along with Faslodex I recommended obtaining scans prior to the next visit for treatment   Orders Placed This Encounter  Procedures  . CT Abdomen Pelvis W Contrast    Standing Status:   Future    Standing Expiration Date:   01/12/2019    Order Specific Question:   If indicated for the ordered procedure, I authorize the administration of contrast media per Radiology protocol    Answer:   Yes    Order Specific Question:   Preferred imaging location?    Answer:   [facility]    Order Specific Question:   Radiology Contrast Protocol - do NOT remove file path    Answer:   [email or web address]    Order Specific Question:   Reason for Exam additional comments    Answer:   Metastatic breast cancer restaging  . CT Chest W Contrast    Standing Status:   Future    Standing Expiration Date:   01/12/2019    Order Specific Question:   If indicated for the ordered procedure, I authorize the administration of contrast media per Radiology protocol    Answer:   Yes    Order Specific Question:   Preferred imaging location?    Answer:   [facility]    Order Specific Question:   Radiology Contrast Protocol - do NOT remove file path    Answer:   [email or web address]    Order Specific Question:   Reason for Exam additional comments    Answer:   Metastatic breast cancer restaging  . NM Bone Scan Whole Body    Standing Status:   Future    Standing Expiration Date:    01/12/2019    Order Specific Question:   If indicated for the ordered procedure, I authorize the administration of a radiopharmaceutical  per Radiology protocol    Answer:   Yes    Order Specific Question:   Preferred imaging location?    Answer:   [facility]    Order Specific Question:   Radiology Contrast Protocol - do NOT remove file path    Answer:   [email or web address]    Order Specific Question:   Reason for Exam additional comments    Answer:   Metastatic breast cancer restaging   The patient has a good understanding of the overall plan. she agrees with it. she will call with any problems that may develop before the next visit here.   Harriette Ohara, MD 01/12/18

## 2018-01-12 NOTE — Assessment & Plan Note (Signed)
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 Was on Arimidex since 08/22/2012 to 03/30/16  Subcutaneous nodule excisionleft chest: Infiltrating carcinoma breast primary, ER positive, PR negative, HER-2 positive  009/03/2017:Left chest wall nodule excision recurrent breast cancer   CT CAP: 08/11/17: Open wound. Diffuse bone mets. Bone Scan 08/11/17: Progression. Left femur mets are new. Existing lesions show inc activity  Current treatment: Herceptin and Perjeta (added 09/22/2017 ) Q 4 weeks, Faslodex, Zometa, started 04/28/2016   Toxicities: 1.  Shingles: Treated with Valtrex with improvement; 2. monitoring closely for cardiac toxicities 3.  We will need to monitor for diarrhea related to Perjeta  Return to clinic in 1 month for Herceptin and Perjeta along with Faslodex I recommended obtaining scans prior to the next visit for treatment

## 2018-01-12 NOTE — Patient Instructions (Addendum)
[place] Discharge Instructions for Patients Receiving Chemotherapy  (Herceptin)  Today you received the following chemotherapy agents: Herceptin and Perjeta To help prevent nausea and vomiting after your treatment, we encourage you to take your nausea medication as directed.    If you develop nausea and vomiting that is not controlled by your nausea medication, call the clinic.   BELOW ARE SYMPTOMS THAT SHOULD BE REPORTED IMMEDIATELY:  *FEVER GREATER THAN 100.5 F  *CHILLS WITH OR WITHOUT FEVER  NAUSEA AND VOMITING THAT IS NOT CONTROLLED WITH YOUR NAUSEA MEDICATION  *UNUSUAL SHORTNESS OF BREATH  *UNUSUAL BRUISING OR BLEEDING  TENDERNESS IN MOUTH AND THROAT WITH OR WITHOUT PRESENCE OF ULCERS  *URINARY PROBLEMS  *BOWEL PROBLEMS  UNUSUAL RASH Items with * indicate a potential emergency and should be followed up as soon as possible.  Feel free to call the clinic you have any questions or concerns. The clinic phone number is [phone].  Please show the [place] at check-in to the Emergency Department and triage nurse.  Fulvestrant injection (Faslodex) What is this medicine? FULVESTRANT (ful VES trant) blocks the effects of estrogen. It is used to treat breast cancer. This medicine may be used for other purposes; ask your health care provider or pharmacist if you have questions. COMMON BRAND NAME(S): FASLODEX What should I tell my health care provider before I take this medicine? They need to know if you have any of these conditions: -bleeding problems -liver disease -low levels of platelets in the blood -an unusual or allergic reaction to fulvestrant, other medicines, foods, dyes, or preservatives -pregnant or trying to get pregnant -breast-feeding How should I use this medicine? This medicine is for injection into a muscle. It is usually given by a health care professional in a hospital or clinic setting. Talk to your pediatrician regarding  the use of this medicine in children. Special care may be needed. Overdosage: If you think you have taken too much of this medicine contact a poison control center or emergency room at once. NOTE: This medicine is only for you. Do not share this medicine with others. What if I miss a dose? It is important not to miss your dose. Call your doctor or health care professional if you are unable to keep an appointment. What may interact with this medicine? -medicines that treat or prevent blood clots like warfarin, enoxaparin, and dalteparin This list may not describe all possible interactions. Give your health care provider a list of all the medicines, herbs, non-prescription drugs, or dietary supplements you use. Also tell them if you smoke, drink alcohol, or use illegal drugs. Some items may interact with your medicine. What should I watch for while using this medicine? Your condition will be monitored carefully while you are receiving this medicine. You will need important blood work done while you are taking this medicine. Do not become pregnant while taking this medicine or for at least 1 year after stopping it. Women of child-bearing potential will need to have a negative pregnancy test before starting this medicine. Women should inform their doctor if they wish to become pregnant or think they might be pregnant. There is a potential for serious side effects to an unborn child. Men should inform their doctors if they wish to father a child. This medicine may lower sperm counts. Talk to your health care professional or pharmacist for more information. Do not breast-feed an infant while taking this medicine or for 1 year after the last dose. What  side effects may I notice from receiving this medicine? Side effects that you should report to your doctor or health care professional as soon as possible: -allergic reactions like skin rash, itching or hives, swelling of the face, lips, or tongue -feeling  faint or lightheaded, falls -pain, tingling, numbness, or weakness in the legs -signs and symptoms of infection like fever or chills; cough; flu-like symptoms; sore throat -vaginal bleeding Side effects that usually do not require medical attention (report to your doctor or health care professional if they continue or are bothersome): -aches, pains -constipation -diarrhea -headache -hot flashes -nausea, vomiting -pain at site where injected -stomach pain This list may not describe all possible side effects. Call your doctor for medical advice about side effects. You may report side effects to FDA at 1-800-FDA-1088. Where should I keep my medicine? This drug is given in a hospital or clinic and will not be stored at home. NOTE: This sheet is a summary. It may not cover all possible information. If you have questions about this medicine, talk to your doctor, pharmacist, or health care provider.  2018 Elsevier/Gold Standard (2015-05-01 11:03:55)

## 2018-01-18 ENCOUNTER — Other Ambulatory Visit: Payer: Self-pay | Admitting: Hematology and Oncology

## 2018-01-18 DIAGNOSIS — C50412 Malignant neoplasm of upper-outer quadrant of left female breast: Secondary | ICD-10-CM

## 2018-01-18 DIAGNOSIS — Z170 Estrogen receptor positive status [ER+]: Secondary | ICD-10-CM

## 2018-01-18 MED ORDER — PREDNISONE 50 MG PO TABS: 50.0000 mg | ORAL_TABLET | Freq: Every day | ORAL | Status: DC

## 2018-01-18 MED ORDER — PREDNISONE 50 MG PO TABS: ORAL_TABLET | ORAL | 0 refills | Status: DC

## 2018-01-18 NOTE — Progress Notes (Signed)
Called in for prednisone to be taken because of contrast sensitivity.  She will take 50 mg 12 hours before then again 6 hours before and 1 hour before.  She will also take Benadryl 50 mg 1 hour before the scan. Patient understood these instructions.

## 2018-01-22 ENCOUNTER — Ambulatory Visit (HOSPITAL_COMMUNITY)
Admission: RE | Admit: 2018-01-22 | Discharge: 2018-01-22 | Disposition: A | Discharge status: Home / Self Care | Payer: Medicare Other | Source: Ambulatory Visit | Attending: Hematology and Oncology | Admitting: Hematology and Oncology

## 2018-01-22 DIAGNOSIS — I340 Nonrheumatic mitral (valve) insufficiency: Secondary | ICD-10-CM | POA: Diagnosis not present

## 2018-01-22 DIAGNOSIS — I10 Essential (primary) hypertension: Secondary | ICD-10-CM | POA: Diagnosis not present

## 2018-01-22 DIAGNOSIS — C50919 Malignant neoplasm of unspecified site of unspecified female breast: Secondary | ICD-10-CM | POA: Insufficient documentation

## 2018-01-22 DIAGNOSIS — I252 Old myocardial infarction: Secondary | ICD-10-CM | POA: Insufficient documentation

## 2018-01-22 DIAGNOSIS — C7951 Secondary malignant neoplasm of bone: Secondary | ICD-10-CM | POA: Insufficient documentation

## 2018-01-22 DIAGNOSIS — Z5181 Encounter for therapeutic drug level monitoring: Secondary | ICD-10-CM | POA: Insufficient documentation

## 2018-01-22 DIAGNOSIS — I083 Combined rheumatic disorders of mitral, aortic and tricuspid valves: Secondary | ICD-10-CM | POA: Diagnosis not present

## 2018-01-22 DIAGNOSIS — I351 Nonrheumatic aortic (valve) insufficiency: Secondary | ICD-10-CM

## 2018-01-22 DIAGNOSIS — Z79899 Other long term (current) drug therapy: Secondary | ICD-10-CM | POA: Diagnosis present

## 2018-01-22 DIAGNOSIS — I2510 Atherosclerotic heart disease of native coronary artery without angina pectoris: Secondary | ICD-10-CM | POA: Diagnosis not present

## 2018-01-22 NOTE — Progress Notes (Signed)
  Echocardiogram 2D Echocardiogram has been performed.  [name] Riley 01/22/2018, 12:07 PM

## 2018-02-05 ENCOUNTER — Encounter (HOSPITAL_COMMUNITY)
Admission: RE | Admit: 2018-02-05 | Discharge: 2018-02-05 | Disposition: A | Discharge status: Home / Self Care | Payer: Medicare Other | Source: Ambulatory Visit | Attending: Hematology and Oncology | Admitting: Hematology and Oncology

## 2018-02-05 ENCOUNTER — Ambulatory Visit (HOSPITAL_COMMUNITY)
Admission: RE | Admit: 2018-02-05 | Discharge: 2018-02-05 | Disposition: A | Discharge status: Home / Self Care | Payer: Medicare Other | Source: Ambulatory Visit | Attending: Hematology and Oncology | Admitting: Hematology and Oncology

## 2018-02-05 DIAGNOSIS — Z170 Estrogen receptor positive status [ER+]: Secondary | ICD-10-CM | POA: Diagnosis not present

## 2018-02-05 DIAGNOSIS — C50412 Malignant neoplasm of upper-outer quadrant of left female breast: Secondary | ICD-10-CM | POA: Diagnosis not present

## 2018-02-05 DIAGNOSIS — R911 Solitary pulmonary nodule: Secondary | ICD-10-CM | POA: Diagnosis not present

## 2018-02-05 DIAGNOSIS — C7951 Secondary malignant neoplasm of bone: Secondary | ICD-10-CM | POA: Insufficient documentation

## 2018-02-05 MED ORDER — IOHEXOL 300 MG/ML  SOLN
100.0000 mL | Freq: Once | INTRAMUSCULAR | Status: AC | PRN
  Administered 2018-02-05: 100 mL via INTRAVENOUS

## 2018-02-05 MED ORDER — TECHNETIUM TC 99M MEDRONATE IV KIT
20.8000 | PACK | Freq: Once | INTRAVENOUS | Status: AC | PRN
  Administered 2018-02-05: 20.8 via INTRAVENOUS

## 2018-02-09 ENCOUNTER — Encounter: Payer: Self-pay | Admitting: Adult Health

## 2018-02-09 ENCOUNTER — Inpatient Hospital Stay: Payer: Medicare Other | Attending: Hematology and Oncology

## 2018-02-09 ENCOUNTER — Inpatient Hospital Stay (HOSPITAL_BASED_OUTPATIENT_CLINIC_OR_DEPARTMENT_OTHER): Payer: Medicare Other | Admitting: Adult Health

## 2018-02-09 ENCOUNTER — Telehealth: Payer: Self-pay | Admitting: Hematology and Oncology

## 2018-02-09 ENCOUNTER — Inpatient Hospital Stay: Payer: Medicare Other

## 2018-02-09 VITALS — BP 164/86 | HR 76 | Temp 97.8°F | Resp 18 | Ht 68.0 in | Wt 153.4 lb

## 2018-02-09 DIAGNOSIS — C7951 Secondary malignant neoplasm of bone: Secondary | ICD-10-CM | POA: Diagnosis not present

## 2018-02-09 DIAGNOSIS — Z800 Family history of malignant neoplasm of digestive organs: Secondary | ICD-10-CM

## 2018-02-09 DIAGNOSIS — E785 Hyperlipidemia, unspecified: Secondary | ICD-10-CM | POA: Insufficient documentation

## 2018-02-09 DIAGNOSIS — E039 Hypothyroidism, unspecified: Secondary | ICD-10-CM

## 2018-02-09 DIAGNOSIS — Z803 Family history of malignant neoplasm of breast: Secondary | ICD-10-CM | POA: Diagnosis not present

## 2018-02-09 DIAGNOSIS — Z5111 Encounter for antineoplastic chemotherapy: Secondary | ICD-10-CM | POA: Insufficient documentation

## 2018-02-09 DIAGNOSIS — Z9221 Personal history of antineoplastic chemotherapy: Secondary | ICD-10-CM | POA: Insufficient documentation

## 2018-02-09 DIAGNOSIS — Z9013 Acquired absence of bilateral breasts and nipples: Secondary | ICD-10-CM

## 2018-02-09 DIAGNOSIS — Z79811 Long term (current) use of aromatase inhibitors: Secondary | ICD-10-CM | POA: Diagnosis not present

## 2018-02-09 DIAGNOSIS — Z79818 Long term (current) use of other agents affecting estrogen receptors and estrogen levels: Secondary | ICD-10-CM

## 2018-02-09 DIAGNOSIS — I10 Essential (primary) hypertension: Secondary | ICD-10-CM | POA: Diagnosis not present

## 2018-02-09 DIAGNOSIS — Z95828 Presence of other vascular implants and grafts: Secondary | ICD-10-CM

## 2018-02-09 DIAGNOSIS — I2510 Atherosclerotic heart disease of native coronary artery without angina pectoris: Secondary | ICD-10-CM | POA: Diagnosis not present

## 2018-02-09 DIAGNOSIS — Z923 Personal history of irradiation: Secondary | ICD-10-CM | POA: Diagnosis not present

## 2018-02-09 DIAGNOSIS — C50412 Malignant neoplasm of upper-outer quadrant of left female breast: Secondary | ICD-10-CM

## 2018-02-09 DIAGNOSIS — Z8041 Family history of malignant neoplasm of ovary: Secondary | ICD-10-CM | POA: Insufficient documentation

## 2018-02-09 DIAGNOSIS — Z170 Estrogen receptor positive status [ER+]: Secondary | ICD-10-CM

## 2018-02-09 DIAGNOSIS — Z5112 Encounter for antineoplastic immunotherapy: Secondary | ICD-10-CM | POA: Diagnosis present

## 2018-02-09 LAB — CBC WITH DIFFERENTIAL/PLATELET
Basophils Absolute: 0 10*3/uL (ref 0.0–0.1)
Basophils Relative: 1 %
Eosinophils Absolute: 0.2 10*3/uL (ref 0.0–0.5)
Eosinophils Relative: 4 %
HCT: 35.3 % (ref 34.8–46.6)
Hemoglobin: 12 g/dL (ref 11.6–15.9)
Lymphocytes Relative: 34 %
Lymphs Abs: 2.1 10*3/uL (ref 0.9–3.3)
MCH: 31 pg (ref 25.1–34.0)
MCHC: 34 g/dL (ref 31.5–36.0)
MCV: 91.2 fL (ref 79.5–101.0)
Monocytes Absolute: 0.5 10*3/uL (ref 0.1–0.9)
Monocytes Relative: 9 %
Neutro Abs: 3.2 10*3/uL (ref 1.5–6.5)
Neutrophils Relative %: 52 %
Platelets: 273 10*3/uL (ref 145–400)
RBC: 3.87 MIL/uL (ref 3.70–5.45)
RDW: 13 % (ref 11.2–14.5)
WBC: 6.1 10*3/uL (ref 3.9–10.3)

## 2018-02-09 LAB — COMPREHENSIVE METABOLIC PANEL
ALT: 42 U/L (ref 0–55)
AST: 45 U/L — ABNORMAL HIGH (ref 5–34)
Albumin: 4 g/dL (ref 3.5–5.0)
Alkaline Phosphatase: 132 U/L (ref 40–150)
Anion gap: 9 (ref 3–11)
BUN: 11 mg/dL (ref 7–26)
CO2: 28 mmol/L (ref 22–29)
Calcium: 9.7 mg/dL (ref 8.4–10.4)
Chloride: 103 mmol/L (ref 98–109)
Creatinine, Ser: 0.68 mg/dL (ref 0.60–1.10)
GFR calc Af Amer: >60 mL/min (ref >60)
GFR calc non Af Amer: >60 mL/min (ref >60)
Glucose, Bld: 130 mg/dL (ref 70–140)
Potassium: 3.5 mmol/L (ref 3.5–5.1)
Sodium: 140 mmol/L (ref 136–145)
Total Bilirubin: 0.4 mg/dL (ref 0.2–1.2)
Total Protein: 7 g/dL (ref 6.4–8.3)

## 2018-02-09 MED ORDER — SODIUM CHLORIDE 0.9% FLUSH
10.0000 mL | INTRAVENOUS | Status: DC | PRN
  Administered 2018-02-09: 10 mL
  Filled 2018-02-09: qty 10

## 2018-02-09 MED ORDER — TRASTUZUMAB CHEMO 150 MG IV SOLR
450.0000 mg | Freq: Once | INTRAVENOUS | Status: AC
  Administered 2018-02-09: 450 mg via INTRAVENOUS
  Filled 2018-02-09: qty 21.43

## 2018-02-09 MED ORDER — ACETAMINOPHEN 325 MG PO TABS
ORAL_TABLET | ORAL | Status: AC
  Filled 2018-02-09: qty 2

## 2018-02-09 MED ORDER — SODIUM CHLORIDE 0.9% FLUSH
10.0000 mL | INTRAVENOUS | Status: DC | PRN
  Administered 2018-02-09: 10 mL via INTRAVENOUS
  Filled 2018-02-09: qty 10

## 2018-02-09 MED ORDER — ACETAMINOPHEN 325 MG PO TABS
650.0000 mg | ORAL_TABLET | Freq: Once | ORAL | Status: AC
  Administered 2018-02-09: 650 mg via ORAL

## 2018-02-09 MED ORDER — DIPHENHYDRAMINE HCL 25 MG PO CAPS
50.0000 mg | ORAL_CAPSULE | Freq: Once | ORAL | Status: AC
  Administered 2018-02-09: 50 mg via ORAL

## 2018-02-09 MED ORDER — SODIUM CHLORIDE 0.9 % IV SOLN
Freq: Once | INTRAVENOUS | Status: AC
  Administered 2018-02-09: 12:00:00 via INTRAVENOUS

## 2018-02-09 MED ORDER — SODIUM CHLORIDE 0.9 % IV SOLN
420.0000 mg | Freq: Once | INTRAVENOUS | Status: AC
  Administered 2018-02-09: 420 mg via INTRAVENOUS
  Filled 2018-02-09: qty 14

## 2018-02-09 MED ORDER — FULVESTRANT 250 MG/5ML IM SOLN
500.0000 mg | INTRAMUSCULAR | Status: DC
  Administered 2018-02-09: 500 mg via INTRAMUSCULAR

## 2018-02-09 MED ORDER — DIPHENHYDRAMINE HCL 25 MG PO CAPS
ORAL_CAPSULE | ORAL | Status: AC
  Filled 2018-02-09: qty 2

## 2018-02-09 MED ORDER — FULVESTRANT 250 MG/5ML IM SOLN
INTRAMUSCULAR | Status: AC
  Filled 2018-02-09: qty 5

## 2018-02-09 MED ORDER — HEPARIN SOD (PORK) LOCK FLUSH 100 UNIT/ML IV SOLN
500.0000 [IU] | Freq: Once | INTRAVENOUS | Status: AC | PRN
  Administered 2018-02-09: 500 [IU]
  Filled 2018-02-09: qty 5

## 2018-02-09 NOTE — Assessment & Plan Note (Addendum)
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 Was on Arimidex since 08/22/2012 to 03/30/16  Subcutaneous nodule excisionleft chest: Infiltrating carcinoma breast primary, ER positive, PR negative, HER-2 positive  009/03/2017:Left chest wall nodule excision recurrent breast cancer   CT CAP: 08/11/17: Open wound. Diffuse bone mets. Bone Scan 08/11/17: Progression. Left femur mets are new. Existing lesions show inc activity CT CAP and bone scan on 02/05/17 are consistent with progression in bone lesions, also new pulmonary nodule that is 5 mm.    Echo 01/22/18: EF 55-60%  Current treatment: Herceptin and Perjeta (added 09/22/2017 ) Q 4 weeks, Faslodex, Zometa, started 04/28/2016   I reviewed the results with Shaunte and her husband today in detail.  I reviewed these also with Dr. Jana Hakim.  She will proceed with treatment today with Herceptin, Perjeta and Faslodex. She will not receive Zometa.  I have placed an order for Xgeva for her to potentially start next week when she returns to see Dr. Lindi Adie.  I offered for her to see Dr. Jana Hakim if she would like to talk to him, and she declined.    We reviewed her pain control and she will take aleve BID if needed for the pain.  If she wanted to combine with an extra strength tylenol, she can.  She will see if that works. If not, then she may benefit from Tramadol.

## 2018-02-09 NOTE — Patient Instructions (Signed)
[facility] Discharge Instructions for Patients Receiving Chemotherapy  Today you received the following chemotherapy agents Herceptin and Perjeta   To help prevent nausea and vomiting after your treatment, we encourage you to take your nausea medication as directed.   If you develop nausea and vomiting that is not controlled by your nausea medication, call the clinic.   BELOW ARE SYMPTOMS THAT SHOULD BE REPORTED IMMEDIATELY:  *FEVER GREATER THAN 100.5 F  *CHILLS WITH OR WITHOUT FEVER  NAUSEA AND VOMITING THAT IS NOT CONTROLLED WITH YOUR NAUSEA MEDICATION  *UNUSUAL SHORTNESS OF BREATH  *UNUSUAL BRUISING OR BLEEDING  TENDERNESS IN MOUTH AND THROAT WITH OR WITHOUT PRESENCE OF ULCERS  *URINARY PROBLEMS  *BOWEL PROBLEMS  UNUSUAL RASH Items with * indicate a potential emergency and should be followed up as soon as possible.  Feel free to call the clinic should you have any questions or concerns. The clinic phone number is [phone].  Please show the [place] at check-in to the Emergency Department and triage nurse.  Fulvestrant injection What is this medicine? FULVESTRANT (ful VES trant) blocks the effects of estrogen. It is used to treat breast cancer. This medicine may be used for other purposes; ask your health care provider or pharmacist if you have questions. COMMON BRAND NAME(S): FASLODEX What should I tell my health care provider before I take this medicine? They need to know if you have any of these conditions: -bleeding problems -liver disease -low levels of platelets in the blood -an unusual or allergic reaction to fulvestrant, other medicines, foods, dyes, or preservatives -pregnant or trying to get pregnant -breast-feeding How should I use this medicine? This medicine is for injection into a muscle. It is usually given by a health care professional in a hospital or clinic setting. Talk to your pediatrician regarding the use of this  medicine in children. Special care may be needed. Overdosage: If you think you have taken too much of this medicine contact a poison control center or emergency room at once. NOTE: This medicine is only for you. Do not share this medicine with others. What if I miss a dose? It is important not to miss your dose. Call your doctor or health care professional if you are unable to keep an appointment. What may interact with this medicine? -medicines that treat or prevent blood clots like warfarin, enoxaparin, and dalteparin This list may not describe all possible interactions. Give your health care provider a list of all the medicines, herbs, non-prescription drugs, or dietary supplements you use. Also tell them if you smoke, drink alcohol, or use illegal drugs. Some items may interact with your medicine. What should I watch for while using this medicine? Your condition will be monitored carefully while you are receiving this medicine. You will need important blood work done while you are taking this medicine. Do not become pregnant while taking this medicine or for at least 1 year after stopping it. Women of child-bearing potential will need to have a negative pregnancy test before starting this medicine. Women should inform their doctor if they wish to become pregnant or think they might be pregnant. There is a potential for serious side effects to an unborn child. Men should inform their doctors if they wish to father a child. This medicine may lower sperm counts. Talk to your health care professional or pharmacist for more information. Do not breast-feed an infant while taking this medicine or for 1 year after the last dose. What side  effects may I notice from receiving this medicine? Side effects that you should report to your doctor or health care professional as soon as possible: -allergic reactions like skin rash, itching or hives, swelling of the face, lips, or tongue -feeling faint or  lightheaded, falls -pain, tingling, numbness, or weakness in the legs -signs and symptoms of infection like fever or chills; cough; flu-like symptoms; sore throat -vaginal bleeding Side effects that usually do not require medical attention (report to your doctor or health care professional if they continue or are bothersome): -aches, pains -constipation -diarrhea -headache -hot flashes -nausea, vomiting -pain at site where injected -stomach pain This list may not describe all possible side effects. Call your doctor for medical advice about side effects. You may report side effects to FDA at 1-800-FDA-1088. Where should I keep my medicine? This drug is given in a hospital or clinic and will not be stored at home. NOTE: This sheet is a summary. It may not cover all possible information. If you have questions about this medicine, talk to your doctor, pharmacist, or health care provider.  2018 Elsevier/Gold Standard (2015-05-01 11:03:55)

## 2018-02-09 NOTE — Telephone Encounter (Signed)
Gave patient AVs and calendar of upcoming may appointments.  °

## 2018-02-09 NOTE — Progress Notes (Signed)
[facility] Follow up:    [name] Housekeeper, MD [place] [id number]   DIAGNOSIS: Cancer Staging Breast cancer of upper-outer quadrant of left female breast [facility]) Staging form: Breast, AJCC 7th Edition - Clinical: Stage IIB (T2, N1, cM0) - Unsigned - Pathologic: No stage assigned - Unsigned   SUMMARY OF ONCOLOGIC HISTORY:   Breast cancer of upper-outer quadrant of left female breast ([place])   11/14/2011 Surgery    Bilateral mastectomy, prophylactic on the right, left breast IDC 3/18 lymph nodes positive with extracapsular extension ER 89%, PR 81%, HER-2 negative, Ki-67 79% T2 N1 A. stage IIB      12/13/2011 - 06/28/2012 Chemotherapy    4 cycles of FEC followed by 4 cycles of Taxotere      07/17/2012 - 08/22/2012 Radiation Therapy    Adjuvant radiation therapy      08/22/2012 - 03/16/2016 Anti-estrogen oral therapy    Arimidex 1 mg daily      03/16/2016 Relapse/Recurrence    Subcutaneous nodule excision left chest: Infiltrating carcinoma breast primary, ER positive, PR negative      03/29/2016 Imaging    CT CAP and bone scan: Lytic lesions T8 vertebral, T1 posterior element, subcutaneous nodule left lateral chest wall, nonspecific lung nodules; Bone scan: Mets to kull, left humerus, left eighth rib, T7/T8, sternum, left acetabulum      04/28/2016 - 06/17/2017 Chemotherapy    Herceptin, lapatinib, Faslodex, Zometa every 4 weeks, lapatinib discontinued in September 2018 due to elevation of LFTs      06/22/2017 Relapse/Recurrence    Surgical excision:Soft tissue mass left lateral chest wall primary breast cancer, soft tissue mass left medial chest wall breast cancer, tumor is within the dermis extending to the subcutaneous adipose tissue and involves portions of skeletal muscle      08/2017 -  Chemotherapy    Faslodex with Herceptin and Perjeta along with Zometa every 4 weeks        CURRENT THERAPY: Faslodex, Herceptin, Perjeta,  Zometa  INTERVAL HISTORY: [name] Riley 67 y.o. female returns for evaluation of her metastatic breast cancer.  She is here today with her husband.  She recently had scans and wants to review those today.  She is doing well.  She has occasional left hip pain, and chest wall pain in the area she previously had shingles.  She says that    Patient Active Problem List   Diagnosis Date Noted  . Elevated LFTs   . Metastatic breast cancer ([place]) 06/29/2017  . Nodule of skin of breast 03/06/2017  . Abscess, abdomen 07/06/2016  . Metastasis to bone ([place]) 06/01/2016  . Pneumothorax on left 04/25/2016  . Chest pain 10/23/2014  . Radiation-induced dermatitis 07/27/2012  . Hypertension   . Breast cancer of upper-outer quadrant of left female breast ([place]) 09/01/2011  . Hypothyroidism 06/12/2009  . HYPERLIPIDEMIA 06/12/2009  . Essential hypertension 06/12/2009  . Coronary atherosclerosis 06/12/2009  . DIZZINESS 06/12/2009  . PALPITATIONS 06/12/2009  . Heart attack ([place]) 09/16/2000    is allergic to contrast media [iodinated diagnostic agents]; food; pravastatin; and zosyn [piperacillin sod-tazobactam so].  MEDICAL HISTORY: Past Medical History:  Diagnosis Date  . Allergy   . Breast cancer ([place]) 08/25/2011   L , invasive ductal carcinoma, ER/PR +,HER2 -  . Cancer [facility])    left breast cancer  . Coronary artery disease 2001  . Heart attack [facility]) 09/2000   Sep 25, 2000  --no intervention  . History of  chemotherapy comp. 08/22/2012   4 cycles of FEC and $ cycles of Taxotere  . Hyperlipidemia   . Hypertension   . Hypothyroidism   . PONV (postoperative nausea and vomiting)    gets sick from anesthesia  . Status post radiation therapy 07/09/12 - 08/22/2012   Left Breast, 60.4 gray    SURGICAL HISTORY: Past Surgical History:  Procedure Laterality Date  . ABDOMINAL HYSTERECTOMY  1998   TAH, oophorectomy  . APPENDECTOMY  1970  . BREAST SURGERY  1998   removal of benign lump in rt breast   . BREAST SURGERY  11/14/11   right simple mastectomy, left mrm  . INCISION AND DRAINAGE OF WOUND Left 07/01/2017   Procedure: IRRIGATION AND DEBRIDEMENT CHEST WALL ABSCESS;  Surgeon: Donnie Mesa, MD;  Location: [facility];  Service: General;  Laterality: Left;  Marland Kitchen MASS EXCISION Left 06/22/2017   Procedure: EXCISION OF CHEST WALL MASSES;  Surgeon: Donnie Mesa, MD;  Location: [facility];  Service: General;  Laterality: Left;  Marland Kitchen MASTECTOMY Bilateral    for left breast cancer  . OVARIAN CYST SURGERY Right 1970  . PORT-A-CATH REMOVAL  08/30/2012   Procedure: REMOVAL PORT-A-CATH;  Surgeon: Imogene Burn. [name], MD;  Location: [facility];  Service: General;  Laterality: Right;  port removal  . PORTACATH PLACEMENT  11/14/2011   Procedure: INSERTION PORT-A-CATH;  Surgeon: Imogene Burn. [name], MD;  Location: [facility];  Service: General;  Laterality: Right;  . PORTACATH PLACEMENT N/A 04/25/2016   Procedure: INSERTION PORT-A-CATH LEFT CHEST;  Surgeon: Donnie Mesa, MD;  Location: [facility];  Service: General;  Laterality: N/A;  . skin tags  05/09/1997   left axillary left neck skin tags  . TONSILLECTOMY  1968    SOCIAL HISTORY: Social History   Socioeconomic History  . Marital status: Married    Spouse name: Not on file  . Number of children: 3  . Years of education: Not on file  . Highest education level: Not on file  Occupational History  . Occupation: Works at [employer]  . Financial resource strain: Not on file  . Food insecurity:    Worry: Not on file    Inability: Not on file  . Transportation needs:    Medical: Not on file    Non-medical: Not on file  Tobacco Use  . Smoking status: Never Smoker  . Smokeless tobacco: Never Used  Substance and Sexual Activity  . Alcohol use: No  . Drug use: No  . Sexual activity: Yes    Birth control/protection: Surgical    Comment: menarche 9, Par[place] age 55, G9, P3, 1 miscarriage,  HRT x 5-10 yrs, Mild Hot  Flashes  Lifestyle  . Physical activity:    Days per week: Not on file    Minutes per session: Not on file  . Stress: Not on file  Relationships  . Social connections:    Talks on phone: Not on file    Gets together: Not on file    Attends religious service: Not on file    Active member of club or organization: Not on file    Attends meetings of clubs or organizations: Not on file    Relationship status: Not on file  . Intimate partner violence:    Fear of current or ex partner: Not on file    Emotionally abused: Not on file    Physically abused: Not on file    Forced sexual activity: Not  on file  Other Topics Concern  . Not on file  Social History Narrative   Lives at home.    FAMILY HISTORY: Family History  Problem Relation Age of Onset  . Hypertension Maternal Grandmother   . Diabetes Maternal Grandmother   . Cancer Father 53       lung cancer and Prostate Cancer  . Hypertension Mother   . Cancer Paternal Aunt        ovarian  . Cancer Cousin        breast, paternal cousin  . Cancer Paternal Uncle        stomach  . Cancer Paternal Grandfather        Esophagus  . Colon cancer Neg Hx     Review of Systems  Constitutional: Negative for appetite change, chills, fatigue, fever and unexpected weight change.  HENT:   Negative for hearing loss, lump/mass and trouble swallowing.   Eyes: Negative for eye problems and icterus.  Respiratory: Negative for chest tightness, cough and shortness of breath.   Cardiovascular: Negative for chest pain, leg swelling and palpitations.  Gastrointestinal: Negative for abdominal distention, abdominal pain, constipation, diarrhea, nausea and vomiting.  Endocrine: Negative for hot flashes.  Skin: Negative for itching and rash.  Neurological: Negative for dizziness, extremity weakness, headaches and numbness.  Hematological: Negative for adenopathy. Does not bruise/bleed easily.  Psychiatric/Behavioral: Negative for depression. The  patient is not nervous/anxious.       PHYSICAL EXAMINATION  ECOG PERFORMANCE STATUS: 1 - Symptomatic but completely ambulatory  Vitals:   02/09/18 1037  BP: (!) 164/86  Pulse: 76  Resp: 18  Temp: 97.8 F (36.6 C)  SpO2: 100%    Physical Exam  Constitutional: She is oriented to person, place, and time and well-developed, well-nourished, and in no distress.  HENT:  Head: Normocephalic and atraumatic.  Mouth/Throat: Oropharynx is clear and moist. No oropharyngeal exudate.  Eyes: Pupils are equal, round, and reactive to light. No scleral icterus.  Neck: Neck supple.  Cardiovascular: Normal rate, regular rhythm and normal heart sounds.  Pulmonary/Chest: Effort normal and breath sounds normal. No respiratory distress. She has no wheezes. She has no rales.  Abdominal: Soft. Bowel sounds are normal. She exhibits no distension and no mass. There is no tenderness. There is no rebound and no guarding.  Musculoskeletal: She exhibits no edema.  Lymphadenopathy:    She has no cervical adenopathy.  Neurological: She is alert and oriented to person, place, and time.  Skin: Skin is warm and dry. No rash noted.  Psychiatric: Mood and affect normal.    LABORATORY DATA:  CBC    Component Value Date/Time   WBC 6.1 02/09/2018 0933   RBC 3.87 02/09/2018 0933   HGB 12.0 02/09/2018 0933   HGB 12.2 12/15/2017 0904   HGB 11.5 (L) 09/22/2017 0958   HCT 35.3 02/09/2018 0933   HCT 34.9 09/22/2017 0958   PLT 273 02/09/2018 0933   PLT 262 12/15/2017 0904   PLT 295 09/22/2017 0958   MCV 91.2 02/09/2018 0933   MCV 94.3 09/22/2017 0958   MCH 31.0 02/09/2018 0933   MCHC 34.0 02/09/2018 0933   RDW 13.0 02/09/2018 0933   RDW 15.3 (H) 09/22/2017 0958   LYMPHSABS 2.1 02/09/2018 0933   LYMPHSABS 2.2 09/22/2017 0958   MONOABS 0.5 02/09/2018 0933   MONOABS 0.8 09/22/2017 0958   EOSABS 0.2 02/09/2018 0933   EOSABS 0.1 09/22/2017 0958   BASOSABS 0.0 02/09/2018 0933   BASOSABS 0.0 09/22/2017 1093  CMP     Component Value Date/Time   NA 140 02/09/2018 0933   NA 140 09/22/2017 0958   K 3.5 02/09/2018 0933   K 3.7 09/22/2017 0958   CL 103 02/09/2018 0933   CL 105 08/10/2012 0908   CO2 28 02/09/2018 0933   CO2 26 09/22/2017 0958   GLUCOSE 130 02/09/2018 0933   GLUCOSE 80 09/22/2017 0958   GLUCOSE 81 08/10/2012 0908   BUN 11 02/09/2018 0933   BUN 12.8 09/22/2017 0958   CREATININE 0.68 02/09/2018 0933   CREATININE 0.70 12/15/2017 0904   CREATININE 0.6 09/22/2017 0958   CALCIUM 9.7 02/09/2018 0933   CALCIUM 9.1 09/22/2017 0958   PROT 7.0 02/09/2018 0933   PROT 7.0 09/22/2017 0958   ALBUMIN 4.0 02/09/2018 0933   ALBUMIN 4.0 09/22/2017 0958   AST 45 (H) 02/09/2018 0933   AST 33 12/15/2017 0904   AST 37 (H) 09/22/2017 0958   ALT 42 02/09/2018 0933   ALT 23 12/15/2017 0904   ALT 31 09/22/2017 0958   ALKPHOS 132 02/09/2018 0933   ALKPHOS 140 09/22/2017 0958   BILITOT 0.4 02/09/2018 0933   BILITOT 0.5 12/15/2017 0904   BILITOT 0.41 09/22/2017 0958   GFRNONAA >60 02/09/2018 0933   GFRNONAA >60 12/15/2017 0904   GFRAA >60 02/09/2018 0933   GFRAA >60 12/15/2017 0904       ASSESSMENT and PLAN:   Breast cancer of upper-outer quadrant of left female breast ([place]) Left breast invasive ductal carcinomaT2, N1, 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 Was on Arimidex since 08/22/2012 to 03/30/16  Subcutaneous nodule excisionleft chest: Infiltrating carcinoma breast primary, ER positive, PR negative, HER-2 positive  009/03/2017:Left chest wall nodule excision recurrent breast cancer   CT CAP: 08/11/17: Open wound. Diffuse bone mets. Bone Scan 08/11/17: Progression. Left femur mets are new. Existing lesions show inc activity CT CAP and bone scan on 02/05/17 are consistent with progression in bone lesions, also new pulmonary  nodule that is 5 mm.    Echo 01/22/18: EF 55-60%  Current treatment: Herceptin and Perjeta (added 09/22/2017 ) Q 4 weeks, Faslodex, Zometa, started 04/28/2016   I reviewed the results with Dannetta and her husband today in detail.  I reviewed these also with Dr. Jana Hakim.  She will proceed with treatment today with Herceptin, Perjeta and Faslodex. She will not receive Zometa.  I have placed an order for Xgeva for her to potentially start next week when she returns to see Dr. Lindi Adie.  I offered for her to see Dr. Jana Hakim if she would like to talk to him, and she declined.    We reviewed her pain control and she will take aleve BID if needed for the pain.  If she wanted to combine with an extra strength tylenol, she can.  She will see if that works. If not, then she may benefit from Tramadol.         All questions were answered. The patient knows to call the clinic with any problems, questions or concerns. We can certainly see the patient much sooner if necessary.  A total of (30) minutes of face-to-face time was spent with this patient with greater than 50% of that time in counseling and care-coordination.  This note was electronically signed. Scot Dock, NP 02/10/2018

## 2018-02-15 ENCOUNTER — Telehealth: Payer: Self-pay | Admitting: Hematology and Oncology

## 2018-02-15 ENCOUNTER — Inpatient Hospital Stay: Payer: Medicare Other

## 2018-02-15 ENCOUNTER — Other Ambulatory Visit: Payer: Self-pay | Admitting: Hematology and Oncology

## 2018-02-15 ENCOUNTER — Inpatient Hospital Stay: Payer: Medicare Other | Attending: Hematology and Oncology

## 2018-02-15 ENCOUNTER — Inpatient Hospital Stay (HOSPITAL_BASED_OUTPATIENT_CLINIC_OR_DEPARTMENT_OTHER): Payer: Medicare Other | Admitting: Hematology and Oncology

## 2018-02-15 VITALS — BP 169/92 | HR 76 | Temp 98.3°F | Resp 17

## 2018-02-15 DIAGNOSIS — Z5112 Encounter for antineoplastic immunotherapy: Secondary | ICD-10-CM | POA: Diagnosis not present

## 2018-02-15 DIAGNOSIS — Z9221 Personal history of antineoplastic chemotherapy: Secondary | ICD-10-CM | POA: Diagnosis not present

## 2018-02-15 DIAGNOSIS — Z79811 Long term (current) use of aromatase inhibitors: Secondary | ICD-10-CM | POA: Diagnosis not present

## 2018-02-15 DIAGNOSIS — Z9013 Acquired absence of bilateral breasts and nipples: Secondary | ICD-10-CM | POA: Diagnosis not present

## 2018-02-15 DIAGNOSIS — Z170 Estrogen receptor positive status [ER+]: Secondary | ICD-10-CM

## 2018-02-15 DIAGNOSIS — Z79899 Other long term (current) drug therapy: Secondary | ICD-10-CM | POA: Insufficient documentation

## 2018-02-15 DIAGNOSIS — C7951 Secondary malignant neoplasm of bone: Secondary | ICD-10-CM

## 2018-02-15 DIAGNOSIS — C50412 Malignant neoplasm of upper-outer quadrant of left female breast: Secondary | ICD-10-CM

## 2018-02-15 DIAGNOSIS — R918 Other nonspecific abnormal finding of lung field: Secondary | ICD-10-CM | POA: Insufficient documentation

## 2018-02-15 DIAGNOSIS — Z7982 Long term (current) use of aspirin: Secondary | ICD-10-CM | POA: Insufficient documentation

## 2018-02-15 DIAGNOSIS — Z923 Personal history of irradiation: Secondary | ICD-10-CM | POA: Insufficient documentation

## 2018-02-15 LAB — CBC WITH DIFFERENTIAL/PLATELET
Basophils Absolute: 0 10*3/uL (ref 0.0–0.1)
Basophils Relative: 1 %
Eosinophils Absolute: 0.3 10*3/uL (ref 0.0–0.5)
Eosinophils Relative: 5 %
HCT: 35 % (ref 34.8–46.6)
Hemoglobin: 12 g/dL (ref 11.6–15.9)
Lymphocytes Relative: 36 %
Lymphs Abs: 2.1 10*3/uL (ref 0.9–3.3)
MCH: 31.1 pg (ref 25.1–34.0)
MCHC: 34.3 g/dL (ref 31.5–36.0)
MCV: 90.8 fL (ref 79.5–101.0)
Monocytes Absolute: 0.6 10*3/uL (ref 0.1–0.9)
Monocytes Relative: 11 %
Neutro Abs: 2.8 10*3/uL (ref 1.5–6.5)
Neutrophils Relative %: 47 %
Platelets: 253 10*3/uL (ref 145–400)
RBC: 3.85 MIL/uL (ref 3.70–5.45)
RDW: 13 % (ref 11.2–14.5)
WBC: 5.8 10*3/uL (ref 3.9–10.3)

## 2018-02-15 LAB — COMPREHENSIVE METABOLIC PANEL
ALT: 36 U/L (ref 0–55)
AST: 38 U/L — ABNORMAL HIGH (ref 5–34)
Albumin: 4.3 g/dL (ref 3.5–5.0)
Alkaline Phosphatase: 124 U/L (ref 40–150)
Anion gap: 9 (ref 3–11)
BUN: 13 mg/dL (ref 7–26)
CO2: 27 mmol/L (ref 22–29)
Calcium: 9.8 mg/dL (ref 8.4–10.4)
Chloride: 104 mmol/L (ref 98–109)
Creatinine, Ser: 0.71 mg/dL (ref 0.60–1.10)
GFR calc Af Amer: >60 mL/min (ref >60)
GFR calc non Af Amer: >60 mL/min (ref >60)
Glucose, Bld: 117 mg/dL (ref 70–140)
Potassium: 3.4 mmol/L — ABNORMAL LOW (ref 3.5–5.1)
Sodium: 140 mmol/L (ref 136–145)
Total Bilirubin: 0.5 mg/dL (ref 0.2–1.2)
Total Protein: 7.3 g/dL (ref 6.4–8.3)

## 2018-02-15 MED ORDER — SODIUM CHLORIDE 0.9% FLUSH
10.0000 mL | INTRAVENOUS | Status: DC | PRN
  Administered 2018-02-15: 10 mL
  Filled 2018-02-15: qty 10

## 2018-02-15 MED ORDER — DENOSUMAB 120 MG/1.7ML ~~LOC~~ SOLN
SUBCUTANEOUS | Status: AC
  Filled 2018-02-15: qty 1.7

## 2018-02-15 MED ORDER — DENOSUMAB 120 MG/1.7ML ~~LOC~~ SOLN
120.0000 mg | Freq: Once | SUBCUTANEOUS | Status: AC
  Administered 2018-02-15: 120 mg via SUBCUTANEOUS

## 2018-02-15 MED ORDER — LIDOCAINE-PRILOCAINE 2.5-2.5 % EX CREA: TOPICAL_CREAM | CUTANEOUS | 3 refills | Status: DC | PRN

## 2018-02-15 MED ORDER — HEPARIN SOD (PORK) LOCK FLUSH 100 UNIT/ML IV SOLN
500.0000 [IU] | Freq: Once | INTRAVENOUS | Status: AC | PRN
  Administered 2018-02-15: 500 [IU]
  Filled 2018-02-15: qty 5

## 2018-02-15 NOTE — Assessment & Plan Note (Signed)
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 Was on Arimidex since 08/22/2012 to 03/30/16  Subcutaneous nodule excisionleft chest: Infiltrating carcinoma breast primary, ER positive, PR negative, HER-2 positive  009/03/2017:Left chest wall nodule excision recurrent breast cancer  CT CAP: 08/11/17: Open wound. Diffuse bone mets. Bone Scan 08/11/17: Progression. Left femur mets are new. Existing lesions show inc activity CT CAP and bone scan on 02/05/17 are consistent with progression in bone lesions, also new pulmonary nodule that is 5 mm.    Echo 01/22/18: EF 55-60%  Current treatment: Herceptinand Perjeta(added 09/22/2017)Q 4 weeks, Faslodex, Zometa, started 04/28/2016   CT CAP: 02/05/2018: New 5 mm right lower lobe pulmonary nodule suspicious for new pulmonary metastatic disease.  Significant progression of bone metastatic disease. Bone scan: 02/05/2018: Progressive bone metastatic disease with new uptake at L2, posterior right eighth rib, proximal right humerus  Based on progression of disease I recommended switching treatment to Kadcyla.  We will need to discontinue Faslodex.  Kadcyla counseling: I discussed with the patient the risks and benefits of Kadcyla and she understands these risks and is agreeable to proceed

## 2018-02-15 NOTE — Progress Notes (Signed)
Patient Care Team: [name], MD as PCP - General (Family Medicine)  DIAGNOSIS:  Encounter Diagnosis  Name Primary?  . Malignant neoplasm of upper-outer quadrant of left breast in female, estrogen receptor positive ([place])     SUMMARY OF ONCOLOGIC HISTORY:   Breast cancer of upper-outer quadrant of left female breast ([place])   11/14/2011 Surgery    Bilateral mastectomy, prophylactic on the right, left breast IDC 3/18 lymph nodes positive with extracapsular extension ER 89%, PR 81%, HER-2 negative, Ki-67 79% T2 N1 A. stage IIB      12/13/2011 - 06/28/2012 Chemotherapy    4 cycles of FEC followed by 4 cycles of Taxotere      07/17/2012 - 08/22/2012 Radiation Therapy    Adjuvant radiation therapy      08/22/2012 - 03/16/2016 Anti-estrogen oral therapy    Arimidex 1 mg daily      03/16/2016 Relapse/Recurrence    Subcutaneous nodule excision left chest: Infiltrating carcinoma breast primary, ER positive, PR negative      03/29/2016 Imaging    CT CAP and bone scan: Lytic lesions T8 vertebral, T1 posterior element, subcutaneous nodule left lateral chest wall, nonspecific lung nodules; Bone scan: Mets to kull, left humerus, left eighth rib, T7/T8, sternum, left acetabulum      04/28/2016 - 06/17/2017 Chemotherapy    Herceptin, lapatinib, Faslodex, Zometa every 4 weeks, lapatinib discontinued in September 2018 due to elevation of LFTs      06/22/2017 Relapse/Recurrence    Surgical excision:Soft tissue mass left lateral chest wall primary breast cancer, soft tissue mass left medial chest wall breast cancer, tumor is within the dermis extending to the subcutaneous adipose tissue and involves portions of skeletal muscle      08/2017 -  Chemotherapy    Faslodex with Herceptin and Perjeta along with Zometa every 4 weeks        CHIEF COMPLIANT: Faslodex with Herceptin and Perjeta, progression of disease and scans  INTERVAL HISTORY: [name] Riley is a 67 year old with above-mentioned  history metastatic breast cancer who is currently on Faslodex along with Herceptin and Perjeta.  She had CT scans of bone scan and is here today to discuss the results.  She has been tolerating the treatment extremely well.  REVIEW OF SYSTEMS:   Constitutional: Denies fevers, chills or abnormal weight loss Eyes: Denies blurriness of vision Ears, nose, mouth, throat, and face: Denies mucositis or sore throat Respiratory: Denies cough, dyspnea or wheezes Cardiovascular: Denies palpitation, chest discomfort Gastrointestinal:  Denies nausea, heartburn or change in bowel habits Skin: Denies abnormal skin rashes Lymphatics: Denies new lymphadenopathy or easy bruising Neurological:Denies numbness, tingling or new weaknesses Behavioral/Psych: Mood is stable, no new changes  Extremities: No lower extremity edema Breast:  denies any pain or lumps or nodules in either breasts All other systems were reviewed with the patient and are negative.  I have reviewed the past medical history, past surgical history, social history and family history with the patient and they are unchanged from previous note.  ALLERGIES:  is allergic to contrast media [iodinated diagnostic agents]; food; pravastatin; and zosyn [piperacillin sod-tazobactam so].  MEDICATIONS:  Current Outpatient Medications  Medication Sig Dispense Refill  . acetaminophen (TYLENOL) 500 MG tablet Take 1,000 mg by mouth every 6 (six) hours as needed for moderate pain.    Marland Kitchen aspirin 81 MG tablet Take 81 mg by mouth daily.      Marland Kitchen atorvastatin (LIPITOR) 20 MG tablet Take by mouth.    . ergocalciferol (  VITAMIN D2) 50000 units capsule Take 50,000 Units by mouth every Monday.     . gabapentin (NEURONTIN) 600 MG tablet Take by mouth.     . hydrOXYzine (ATARAX/VISTARIL) 10 MG tablet Take by mouth.    . levothyroxine (SYNTHROID, LEVOTHROID) 88 MCG tablet Take by mouth.    . lidocaine-prilocaine (EMLA) cream Apply topically as needed (for port access).  Apply to affected area once 30 g 3  . metoprolol succinate (TOPROL-XL) 100 MG 24 hr tablet Take 1 tablet (100 mg total) by mouth daily. 90 tablet 3  . Multiple Vitamins-Minerals (MULTIVITAMIN WITH MINERALS) tablet Take 1 tablet by mouth daily.      . ondansetron (ZOFRAN-ODT) 4 MG disintegrating tablet Take 1 tablet (4 mg total) by mouth every 6 (six) hours as needed. 20 tablet 3   No current facility-administered medications for this visit.    Facility-Administered Medications Ordered in Other Visits  Medication Dose Route Frequency Provider Last Rate Last Dose  . 0.9 %  sodium chloride infusion   Intravenous Continuous Donnie Mesa, MD      . acetaminophen (TYLENOL) tablet 975 mg  975 mg Oral Q6H Donnie Mesa, MD      . enoxaparin (LOVENOX) injection 40 mg  40 mg Subcutaneous Q24H Donnie Mesa, MD      . morphine 4 MG/ML injection 2-4 mg  2-4 mg Intravenous Q2H PRN Donnie Mesa, MD      . ondansetron (ZOFRAN-ODT) disintegrating tablet 4 mg  4 mg Oral Q6H PRN Donnie Mesa, MD       Or  . ondansetron (ZOFRAN) 4 mg in sodium chloride 0.9 % 50 mL IVPB  4 mg Intravenous Q6H PRN Donnie Mesa, MD      . oxyCODONE (Oxy IR/ROXICODONE) immediate release tablet 5-10 mg  5-10 mg Oral Q4H PRN Donnie Mesa, MD      . piperacillin-tazobactam (ZOSYN) 3.375 g in dextrose 5 % 50 mL IVPB  3.375 g Intravenous Q8H Donnie Mesa, MD      . prochlorperazine (COMPAZINE) tablet 10 mg  10 mg Oral Q6H PRN Donnie Mesa, MD       Or  . prochlorperazine (COMPAZINE) injection 5-10 mg  5-10 mg Intravenous Q6H PRN Donnie Mesa, MD        PHYSICAL EXAMINATION: ECOG PERFORMANCE STATUS: 1 - Symptomatic but completely ambulatory  Vitals:   02/15/18 0916  BP: (!) 173/96  Pulse: 85  Temp: 98.6 F (37 C)   Filed Weights   02/15/18 0916  Weight: 151 lb 9.6 oz (68.8 kg)    GENERAL:alert, no distress and comfortable SKIN: skin color, texture, turgor are normal, no rashes or significant lesions EYES:  normal, Conjunctiva are pink and non-injected, sclera clear OROPHARYNX:no exudate, no erythema and lips, buccal mucosa, and tongue normal  NECK: supple, thyroid normal size, non-tender, without nodularity LYMPH:  no palpable lymphadenopathy in the cervical, axillary or inguinal LUNGS: clear to auscultation and percussion with normal breathing effort HEART: regular rate & rhythm and no murmurs and no lower extremity edema ABDOMEN:abdomen soft, non-tender and normal bowel sounds MUSCULOSKELETAL:no cyanosis of digits and no clubbing  NEURO: alert & oriented x 3 with fluent speech, no focal motor/sensory deficits EXTREMITIES: No lower extremity edema  LABORATORY DATA:  I have reviewed the data as listed CMP Latest Ref Rng & Units 02/15/2018 02/09/2018 01/12/2018  Glucose 70 - 140 mg/dL 117 130 109  BUN 7 - 26 mg/dL 13 11 12   Creatinine 0.60 - 1.10 mg/dL 0.71 0.68 0.69  Sodium 136 - 145 mmol/L 140 140 139  Potassium 3.5 - 5.1 mmol/L 3.4(L) 3.5 3.5  Chloride 98 - 109 mmol/L 104 103 104  CO2 22 - 29 mmol/L 27 28 27   Calcium 8.4 - 10.4 mg/dL 9.8 9.7 9.6  Total Protein 6.4 - 8.3 g/dL 7.3 7.0 7.4  Total Bilirubin 0.2 - 1.2 mg/dL 0.5 0.4 0.4  Alkaline Phos 40 - 150 U/L 124 132 134  AST 5 - 34 U/L 38(H) 45(H) 31  ALT 0 - 55 U/L 36 42 19    Lab Results  Component Value Date   WBC 5.8 02/15/2018   HGB 12.0 02/15/2018   HCT 35.0 02/15/2018   MCV 90.8 02/15/2018   PLT 253 02/15/2018   NEUTROABS 2.8 02/15/2018    ASSESSMENT & PLAN:  Breast cancer of upper-outer quadrant of left female breast ([place]) Left breast invasive ductal carcinomaT2, N1, 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 Was on Arimidex since 08/22/2012 to 03/30/16  Subcutaneous nodule excisionleft chest: Infiltrating carcinoma breast primary, ER positive, PR negative, HER-2  positive  009/03/2017:Left chest wall nodule excision recurrent breast cancer  CT CAP: 08/11/17: Open wound. Diffuse bone mets. Bone Scan 08/11/17: Progression. Left femur mets are new. Existing lesions show inc activity CT CAP and bone scan on 02/05/17 are consistent with progression in bone lesions, also new pulmonary nodule that is 5 mm.    Echo 01/22/18: EF 55-60%  Current treatment: Herceptinand Perjeta(added 09/22/2017)Q 4 weeks, Faslodex, Zometa, started 04/28/2016   CT CAP: 02/05/2018: New 5 mm right lower lobe pulmonary nodule suspicious for new pulmonary metastatic disease.  Significant progression of bone metastatic disease. Bone scan: 02/05/2018: Progressive bone metastatic disease with new uptake at L2, posterior right eighth rib, proximal right humerus  Based on progression of disease I recommended switching treatment to Kadcyla.  We will need to discontinue Faslodex.  Kadcyla counseling: I discussed with the patient the risks and benefits of Kadcyla and she understands these risks and is agreeable to proceed    No orders of the defined types were placed in this encounter.  The patient has a good understanding of the overall plan. she agrees with it. she will call with any problems that may develop before the next visit here.   Harriette Ohara, MD 02/15/18

## 2018-02-15 NOTE — Telephone Encounter (Signed)
Gave patient AVs and calendar of upcoming appointments.  °

## 2018-02-15 NOTE — Patient Instructions (Signed)

## 2018-03-01 ENCOUNTER — Other Ambulatory Visit: Payer: Self-pay | Admitting: Hematology and Oncology

## 2018-03-01 DIAGNOSIS — C50412 Malignant neoplasm of upper-outer quadrant of left female breast: Secondary | ICD-10-CM

## 2018-03-01 DIAGNOSIS — Z170 Estrogen receptor positive status [ER+]: Secondary | ICD-10-CM

## 2018-03-01 NOTE — Progress Notes (Signed)
START ON PATHWAY REGIMEN - Breast     A cycle is every 21 days:     Ado-trastuzumab emtansine   **Always confirm dose/schedule in your pharmacy ordering system**    Patient Characteristics: Distant Metastases or Locoregional Recurrent Disease - Unresected, HER2 Positive, ER Positive, Chemotherapy + HER2-Targeted Therapy, Second Line Therapeutic Status: Distant Metastases BRCA Mutation Status: Absent ER Status: Positive (+) HER2 Status: Positive (+) PR Status: Positive (+) Line of therapy: Second Line Intent of Therapy: Non-Curative / Palliative Intent, Discussed with Patient

## 2018-03-02 ENCOUNTER — Inpatient Hospital Stay: Payer: Medicare Other

## 2018-03-02 ENCOUNTER — Inpatient Hospital Stay (HOSPITAL_BASED_OUTPATIENT_CLINIC_OR_DEPARTMENT_OTHER): Payer: Medicare Other | Admitting: Hematology and Oncology

## 2018-03-02 VITALS — BP 160/83 | HR 69 | Temp 98.4°F | Resp 18

## 2018-03-02 DIAGNOSIS — Z170 Estrogen receptor positive status [ER+]: Secondary | ICD-10-CM

## 2018-03-02 DIAGNOSIS — C50412 Malignant neoplasm of upper-outer quadrant of left female breast: Secondary | ICD-10-CM

## 2018-03-02 DIAGNOSIS — Z95828 Presence of other vascular implants and grafts: Secondary | ICD-10-CM

## 2018-03-02 DIAGNOSIS — Z5112 Encounter for antineoplastic immunotherapy: Secondary | ICD-10-CM | POA: Diagnosis not present

## 2018-03-02 LAB — CMP (CANCER CENTER ONLY)
ALT: 35 U/L (ref 0–55)
AST: 36 U/L — ABNORMAL HIGH (ref 5–34)
Albumin: 4.3 g/dL (ref 3.5–5.0)
Alkaline Phosphatase: 160 U/L — ABNORMAL HIGH (ref 40–150)
Anion gap: 7 (ref 3–11)
BUN: 15 mg/dL (ref 7–26)
CO2: 28 mmol/L (ref 22–29)
Calcium: 9 mg/dL (ref 8.4–10.4)
Chloride: 105 mmol/L (ref 98–109)
Creatinine: 0.7 mg/dL (ref 0.60–1.10)
GFR, Est AFR Am: >60 mL/min (ref >60)
GFR, Estimated: >60 mL/min (ref >60)
Glucose, Bld: 113 mg/dL (ref 70–140)
Potassium: 3.5 mmol/L (ref 3.5–5.1)
Sodium: 140 mmol/L (ref 136–145)
Total Bilirubin: 0.4 mg/dL (ref 0.2–1.2)
Total Protein: 7.3 g/dL (ref 6.4–8.3)

## 2018-03-02 LAB — CBC WITH DIFFERENTIAL (CANCER CENTER ONLY)
Basophils Absolute: 0 10*3/uL (ref 0.0–0.1)
Basophils Relative: 0 %
Eosinophils Absolute: 0.2 10*3/uL (ref 0.0–0.5)
Eosinophils Relative: 3 %
HCT: 34.8 % (ref 34.8–46.6)
Hemoglobin: 11.7 g/dL (ref 11.6–15.9)
Lymphocytes Relative: 39 %
Lymphs Abs: 2.5 10*3/uL (ref 0.9–3.3)
MCH: 30.9 pg (ref 25.1–34.0)
MCHC: 33.6 g/dL (ref 31.5–36.0)
MCV: 91.8 fL (ref 79.5–101.0)
Monocytes Absolute: 0.5 10*3/uL (ref 0.1–0.9)
Monocytes Relative: 8 %
Neutro Abs: 3.2 10*3/uL (ref 1.5–6.5)
Neutrophils Relative %: 50 %
Platelet Count: 257 10*3/uL (ref 145–400)
RBC: 3.79 MIL/uL (ref 3.70–5.45)
RDW: 13 % (ref 11.2–14.5)
WBC Count: 6.4 10*3/uL (ref 3.9–10.3)

## 2018-03-02 MED ORDER — HEPARIN SOD (PORK) LOCK FLUSH 100 UNIT/ML IV SOLN
500.0000 [IU] | Freq: Once | INTRAVENOUS | Status: AC | PRN
  Administered 2018-03-02: 500 [IU]
  Filled 2018-03-02: qty 5

## 2018-03-02 MED ORDER — ACETAMINOPHEN 325 MG PO TABS
650.0000 mg | ORAL_TABLET | Freq: Once | ORAL | Status: AC
  Administered 2018-03-02: 650 mg via ORAL

## 2018-03-02 MED ORDER — DIPHENHYDRAMINE HCL 25 MG PO CAPS
ORAL_CAPSULE | ORAL | Status: AC
  Filled 2018-03-02: qty 2

## 2018-03-02 MED ORDER — SODIUM CHLORIDE 0.9% FLUSH
10.0000 mL | INTRAVENOUS | Status: DC | PRN
  Administered 2018-03-02: 10 mL via INTRAVENOUS
  Filled 2018-03-02: qty 10

## 2018-03-02 MED ORDER — ACETAMINOPHEN 325 MG PO TABS
ORAL_TABLET | ORAL | Status: AC
  Filled 2018-03-02: qty 2

## 2018-03-02 MED ORDER — SODIUM CHLORIDE 0.9% FLUSH
10.0000 mL | INTRAVENOUS | Status: DC | PRN
  Administered 2018-03-02: 10 mL
  Filled 2018-03-02: qty 10

## 2018-03-02 MED ORDER — SODIUM CHLORIDE 0.9 % IV SOLN
Freq: Once | INTRAVENOUS | Status: AC
  Administered 2018-03-02: 09:00:00 via INTRAVENOUS

## 2018-03-02 MED ORDER — DIPHENHYDRAMINE HCL 25 MG PO CAPS
50.0000 mg | ORAL_CAPSULE | Freq: Once | ORAL | Status: AC
  Administered 2018-03-02: 50 mg via ORAL

## 2018-03-02 MED ORDER — ONDANSETRON 4 MG PO TBDP: 4.0000 mg | ORAL_TABLET | Freq: Four times a day (QID) | ORAL | 3 refills | Status: DC | PRN

## 2018-03-02 MED ORDER — ADO-TRASTUZUMAB EMTANSINE CHEMO INJECTION 160 MG
3.8000 mg/kg | Freq: Once | INTRAVENOUS | Status: AC
  Administered 2018-03-02: 260 mg via INTRAVENOUS
  Filled 2018-03-02: qty 8

## 2018-03-02 NOTE — Patient Instructions (Signed)
[facility] Discharge Instructions for Patients Receiving Chemotherapy  Today you received the following chemotherapy agents Kadcyla  To help prevent nausea and vomiting after your treatment, we encourage you to take your nausea medication as directed   If you develop nausea and vomiting that is not controlled by your nausea medication, call the clinic.   BELOW ARE SYMPTOMS THAT SHOULD BE REPORTED IMMEDIATELY:  *FEVER GREATER THAN 100.5 F  *CHILLS WITH OR WITHOUT FEVER  NAUSEA AND VOMITING THAT IS NOT CONTROLLED WITH YOUR NAUSEA MEDICATION  *UNUSUAL SHORTNESS OF BREATH  *UNUSUAL BRUISING OR BLEEDING  TENDERNESS IN MOUTH AND THROAT WITH OR WITHOUT PRESENCE OF ULCERS  *URINARY PROBLEMS  *BOWEL PROBLEMS  UNUSUAL RASH Items with * indicate a potential emergency and should be followed up as soon as possible.  Feel free to call the clinic should you have any questions or concerns. The clinic phone number is [phone].  Please show the CHEMO ALERT CARD at check-in to the Emergency Department and triage nurse.   Ado-Trastuzumab Emtansine (Kadcyla) for injection What is this medicine? ADO-TRASTUZUMAB EMTANSINE (ADD oh traz TOO zuh mab em TAN zine) is a monoclonal antibody combined with chemotherapy. It is used to treat breast cancer. This medicine may be used for other purposes; ask your health care provider or pharmacist if you have questions. COMMON BRAND NAME(S): Kadcyla What should I tell my health care provider before I take this medicine? They need to know if you have any of these conditions: -heart disease -heart failure -infection (especially a virus infection such as chickenpox, cold sores, or herpes) -liver disease -lung or breathing disease, like asthma -an unusual or allergic reaction to ado-trastuzumab emtansine, other medications, foods, dyes, or preservatives -pregnant or trying to get pregnant -breast-feeding How should I use this  medicine? This medicine is for infusion into a vein. It is given by a health care professional in a hospital or clinic setting. Talk to your pediatrician regarding the use of this medicine in children. Special care may be needed. Overdosage: If you think you have taken too much of this medicine contact a poison control center or emergency room at once. NOTE: This medicine is only for you. Do not share this medicine with others. What if I miss a dose? It is important not to miss your dose. Call your doctor or health care professional if you are unable to keep an appointment. What may interact with this medicine? This medicine may also interact with the following medications: -atazanavir -boceprevir -clarithromycin -delavirdine -indinavir -dalfopristin; quinupristin -isoniazid, INH -itraconazole -ketoconazole -nefazodone -nelfinavir -ritonavir -telaprevir -telithromycin -tipranavir -voriconazole This list may not describe all possible interactions. Give your health care provider a list of all the medicines, herbs, non-prescription drugs, or dietary supplements you use. Also tell them if you smoke, drink alcohol, or use illegal drugs. Some items may interact with your medicine. What should I watch for while using this medicine? Visit your doctor for checks on your progress. This drug may make you feel generally unwell. This is not uncommon, as chemotherapy can affect healthy cells as well as cancer cells. Report any side effects. Continue your course of treatment even though you feel ill unless your doctor tells you to stop. You may need blood work done while you are taking this medicine. Call your doctor or health care professional for advice if you get a fever, chills or sore throat, or other symptoms of a cold or flu. Do not treat yourself. This drug decreases   your body's ability to fight infections. Try to avoid being around people who are sick. Be careful brushing and flossing your  teeth or using a toothpick because you may get an infection or bleed more easily. If you have any dental work done, tell your dentist you are receiving this medicine. Avoid taking products that contain aspirin, acetaminophen, ibuprofen, naproxen, or ketoprofen unless instructed by your doctor. These medicines may hide a fever. Do not become pregnant while taking this medicine or for 7 months after stopping it, men with female partners should use contraception during treatment and for 4 months after the last dose. Women should inform their doctor if they wish to become pregnant or think they might be pregnant. There is a potential for serious side effects to an unborn child. Do not breast-feed an infant while taking this medicine or for 7 months after the last dose. Men who have a partner who is pregnant or who is capable of becoming pregnant should use a condom during sexual activity while taking this medicine and for 4 months after stopping it. Men should inform their doctors if they wish to father a child. This medicine may lower sperm counts. Talk to your health care professional or pharmacist for more information. What side effects may I notice from receiving this medicine? Side effects that you should report to your doctor or health care professional as soon as possible: -allergic reactions like skin rash, itching or hives, swelling of the face, lips, or tongue -breathing problems -chest pain or palpitations -fever or chills, sore throat -general ill feeling or flu-like symptoms -light-colored stools -nausea, vomiting -pain, tingling, numbness in the hands or feet -signs and symptoms of bleeding such as bloody or black, tarry stools; red or dark-brown urine; spitting up blood or brown material that looks like coffee grounds; red spots on the skin; unusual bruising or bleeding from the eye, gums, or nose -swelling of the legs or ankles -yellowing of the eyes or skin Side effects that usually do  not require medical attention (report to your doctor or health care professional if they continue or are bothersome): -changes in taste -constipation -dizziness -headache -joint pain -muscle pain -trouble sleeping -unusually weak or tired This list may not describe all possible side effects. Call your doctor for medical advice about side effects. You may report side effects to FDA at 1-800-FDA-1088. Where should I keep my medicine? This drug is given in a hospital or clinic and will not be stored at home. NOTE: This sheet is a summary. It may not cover all possible information. If you have questions about this medicine, talk to your doctor, pharmacist, or health care provider.  2018 Elsevier/Gold Standard (2015-11-23 12:11:06)   

## 2018-03-02 NOTE — Assessment & Plan Note (Signed)
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 Was on Arimidex since 08/22/2012 to 03/30/16  Subcutaneous nodule excisionleft chest: Infiltrating carcinoma breast primary, ER positive, PR negative, HER-2 positive  009/03/2017:Left chest wall nodule excision recurrent breast cancer  CT CAP: 08/11/17: Open wound. Diffuse bone mets. Bone Scan 08/11/17: Progression. Left femur mets are new. Existing lesions show inc activity Echo 01/22/18: EF 55-60%  Prior treatment: Herceptinand Perjeta(added 09/22/2017)Q 4 weeks, Faslodex, Zometa, started 04/28/2016   CT CAP: 02/05/2018: New 5 mm right lower lobe pulmonary nodule suspicious for new pulmonary metastatic disease.  Significant progression of bone metastatic disease. Bone scan: 02/05/2018: Progressive bone metastatic disease with new uptake at L2, posterior right eighth rib, proximal right humerus --------------------------------------------------------------------------- Current treatment: Kadcyla today is cycle 1 every 3 weeks Return to clinic in 1 week for toxicity check Labs have been reviewed

## 2018-03-02 NOTE — Progress Notes (Signed)
Patient Care Team: [name], MD as PCP - General (Family Medicine)  DIAGNOSIS:  Encounter Diagnosis  Name Primary?  . Malignant neoplasm of upper-outer quadrant of left breast in female, estrogen receptor positive ([place])     SUMMARY OF ONCOLOGIC HISTORY:   Breast cancer of upper-outer quadrant of left female breast ([place])   11/14/2011 Surgery    Bilateral mastectomy, prophylactic on the right, left breast IDC 3/18 lymph nodes positive with extracapsular extension ER 89%, PR 81%, HER-2 negative, Ki-67 79% T2 N1 A. stage IIB      12/13/2011 - 06/28/2012 Chemotherapy    4 cycles of FEC followed by 4 cycles of Taxotere      07/17/2012 - 08/22/2012 Radiation Therapy    Adjuvant radiation therapy      08/22/2012 - 03/16/2016 Anti-estrogen oral therapy    Arimidex 1 mg daily      03/16/2016 Relapse/Recurrence    Subcutaneous nodule excision left chest: Infiltrating carcinoma breast primary, ER positive, PR negative      03/29/2016 Imaging    CT CAP and bone scan: Lytic lesions T8 vertebral, T1 posterior element, subcutaneous nodule left lateral chest wall, nonspecific lung nodules; Bone scan: Mets to kull, left humerus, left eighth rib, T7/T8, sternum, left acetabulum      04/28/2016 - 06/17/2017 Chemotherapy    Herceptin, lapatinib, Faslodex, Zometa every 4 weeks, lapatinib discontinued in September 2018 due to elevation of LFTs      06/22/2017 Relapse/Recurrence    Surgical excision:Soft tissue mass left lateral chest wall primary breast cancer, soft tissue mass left medial chest wall breast cancer, tumor is within the dermis extending to the subcutaneous adipose tissue and involves portions of skeletal muscle      08/2017 - 02/16/2018 Chemotherapy    Faslodex with Herceptin and Perjeta along with Zometa every 4 weeks       03/02/2018 -  Chemotherapy    Kadcyla        CHIEF COMPLIANT: Cycle 1 Kadcyla  INTERVAL HISTORY: [name] Riley is a 67 year old with above-mentioned  history of metastatic breast cancer that is estrogen receptor positive and HER-2 receptor positive.  She is here to begin first cycle of treatment with Kadcyla.  She has had progression of disease based upon scans done on 02/05/2018.  REVIEW OF SYSTEMS:   Constitutional: Denies fevers, chills or abnormal weight loss Eyes: Denies blurriness of vision Ears, nose, mouth, throat, and face: Denies mucositis or sore throat Respiratory: Denies cough, dyspnea or wheezes Cardiovascular: Denies palpitation, chest discomfort Gastrointestinal:  Denies nausea, heartburn or change in bowel habits Skin: Denies abnormal skin rashes Lymphatics: Denies new lymphadenopathy or easy bruising Neurological:Denies numbness, tingling or new weaknesses Behavioral/Psych: Mood is stable, no new changes  Extremities: No lower extremity edema  All other systems were reviewed with the patient and are negative.  I have reviewed the past medical history, past surgical history, social history and family history with the patient and they are unchanged from previous note.  ALLERGIES:  is allergic to contrast media [iodinated diagnostic agents]; food; pravastatin; and zosyn [piperacillin sod-tazobactam so].  MEDICATIONS:  Current Outpatient Medications  Medication Sig Dispense Refill  . acetaminophen (TYLENOL) 500 MG tablet Take 1,000 mg by mouth every 6 (six) hours as needed for moderate pain.    Marland Kitchen aspirin 81 MG tablet Take 81 mg by mouth daily.      Marland Kitchen atorvastatin (LIPITOR) 20 MG tablet Take by mouth.    . ergocalciferol (VITAMIN D2) 50000 units  capsule Take 50,000 Units by mouth every Monday.     . gabapentin (NEURONTIN) 600 MG tablet Take by mouth.     . levothyroxine (SYNTHROID, LEVOTHROID) 88 MCG tablet Take by mouth.    . lidocaine-prilocaine (EMLA) cream Apply topically as needed (for port access). Apply to affected area once 30 g 3  . metoprolol succinate (TOPROL-XL) 100 MG 24 hr tablet Take 1 tablet (100 mg  total) by mouth daily. 90 tablet 3  . Multiple Vitamins-Minerals (MULTIVITAMIN WITH MINERALS) tablet Take 1 tablet by mouth daily.      . ondansetron (ZOFRAN-ODT) 4 MG disintegrating tablet Take 1 tablet (4 mg total) by mouth every 6 (six) hours as needed. 20 tablet 3   No current facility-administered medications for this visit.    Facility-Administered Medications Ordered in Other Visits  Medication Dose Route Frequency Provider Last Rate Last Dose  . 0.9 %  sodium chloride infusion   Intravenous Continuous Donnie Mesa, MD      . acetaminophen (TYLENOL) tablet 975 mg  975 mg Oral Q6H Donnie Mesa, MD      . enoxaparin (LOVENOX) injection 40 mg  40 mg Subcutaneous Q24H Donnie Mesa, MD      . morphine 4 MG/ML injection 2-4 mg  2-4 mg Intravenous Q2H PRN Donnie Mesa, MD      . ondansetron (ZOFRAN-ODT) disintegrating tablet 4 mg  4 mg Oral Q6H PRN Donnie Mesa, MD       Or  . ondansetron (ZOFRAN) 4 mg in sodium chloride 0.9 % 50 mL IVPB  4 mg Intravenous Q6H PRN Donnie Mesa, MD      . oxyCODONE (Oxy IR/ROXICODONE) immediate release tablet 5-10 mg  5-10 mg Oral Q4H PRN Donnie Mesa, MD      . piperacillin-tazobactam (ZOSYN) 3.375 g in dextrose 5 % 50 mL IVPB  3.375 g Intravenous Q8H Donnie Mesa, MD      . prochlorperazine (COMPAZINE) tablet 10 mg  10 mg Oral Q6H PRN Donnie Mesa, MD       Or  . prochlorperazine (COMPAZINE) injection 5-10 mg  5-10 mg Intravenous Q6H PRN Donnie Mesa, MD        PHYSICAL EXAMINATION: ECOG PERFORMANCE STATUS: 1 - Symptomatic but completely ambulatory  Vitals:   03/02/18 0835  BP: (!) 177/92  Pulse: 78  Resp: 17  Temp: 98.4 F (36.9 C)  SpO2: 100%   Filed Weights   03/02/18 0835  Weight: 151 lb 4.8 oz (68.6 kg)    GENERAL:alert, no distress and comfortable SKIN: skin color, texture, turgor are normal, no rashes or significant lesions EYES: normal, Conjunctiva are pink and non-injected, sclera clear OROPHARYNX:no exudate, no  erythema and lips, buccal mucosa, and tongue normal  NECK: supple, thyroid normal size, non-tender, without nodularity LYMPH:  no palpable lymphadenopathy in the cervical, axillary or inguinal LUNGS: clear to auscultation and percussion with normal breathing effort HEART: regular rate & rhythm and no murmurs and no lower extremity edema ABDOMEN:abdomen soft, non-tender and normal bowel sounds MUSCULOSKELETAL:no cyanosis of digits and no clubbing  NEURO: alert & oriented x 3 with fluent speech, no focal motor/sensory deficits EXTREMITIES: No lower extremity edema  LABORATORY DATA:  I have reviewed the data as listed CMP Latest Ref Rng & Units 02/15/2018 02/09/2018 01/12/2018  Glucose 70 - 140 mg/dL 117 130 109  BUN 7 - 26 mg/dL 13 11 12   Creatinine 0.60 - 1.10 mg/dL 0.71 0.68 0.69  Sodium 136 - 145 mmol/L 140 140 139  Potassium 3.5 - 5.1 mmol/L 3.4(L) 3.5 3.5  Chloride 98 - 109 mmol/L 104 103 104  CO2 22 - 29 mmol/L 27 28 27   Calcium 8.4 - 10.4 mg/dL 9.8 9.7 9.6  Total Protein 6.4 - 8.3 g/dL 7.3 7.0 7.4  Total Bilirubin 0.2 - 1.2 mg/dL 0.5 0.4 0.4  Alkaline Phos 40 - 150 U/L 124 132 134  AST 5 - 34 U/L 38(H) 45(H) 31  ALT 0 - 55 U/L 36 42 19    Lab Results  Component Value Date   WBC 6.4 03/02/2018   HGB 11.7 03/02/2018   HCT 34.8 03/02/2018   MCV 91.8 03/02/2018   PLT 257 03/02/2018   NEUTROABS 3.2 03/02/2018    ASSESSMENT & PLAN:  Breast cancer of upper-outer quadrant of left female breast ([place]) Left breast invasive ductal carcinomaT2, N1, 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 Was on Arimidex since 08/22/2012 to 03/30/16  Subcutaneous nodule excisionleft chest: Infiltrating carcinoma breast primary, ER positive, PR negative, HER-2 positive  009/03/2017:Left chest wall nodule excision recurrent breast cancer  CT CAP:  08/11/17: Open wound. Diffuse bone mets. Bone Scan 08/11/17: Progression. Left femur mets are new. Existing lesions show inc activity Echo 01/22/18: EF 55-60%  Prior treatment: Herceptinand Perjeta(added 09/22/2017)Q 4 weeks, Faslodex, Zometa, started 04/28/2016   CT CAP: 02/05/2018: New 5 mm right lower lobe pulmonary nodule suspicious for new pulmonary metastatic disease.  Significant progression of bone metastatic disease. Bone scan: 02/05/2018: Progressive bone metastatic disease with new uptake at L2, posterior right eighth rib, proximal right humerus --------------------------------------------------------------------------- Current treatment: Kadcyla today is cycle 1 every 3 weeks Return to clinic in 3 weeks with cycle 2 I sent a prescription for Zofran. I asked her to call me if she develops any side effects. Labs have been reviewed    No orders of the defined types were placed in this encounter.  The patient has a good understanding of the overall plan. she agrees with it. she will call with any problems that may develop before the next visit here.   Harriette Ohara, MD 03/02/18

## 2018-03-23 ENCOUNTER — Inpatient Hospital Stay (HOSPITAL_BASED_OUTPATIENT_CLINIC_OR_DEPARTMENT_OTHER): Payer: Medicare Other | Admitting: Hematology and Oncology

## 2018-03-23 ENCOUNTER — Inpatient Hospital Stay: Payer: Medicare Other

## 2018-03-23 ENCOUNTER — Inpatient Hospital Stay: Payer: Medicare Other | Attending: Hematology and Oncology

## 2018-03-23 VITALS — BP 137/81 | HR 71 | Temp 98.2°F | Resp 18

## 2018-03-23 DIAGNOSIS — Z7982 Long term (current) use of aspirin: Secondary | ICD-10-CM

## 2018-03-23 DIAGNOSIS — Z9221 Personal history of antineoplastic chemotherapy: Secondary | ICD-10-CM

## 2018-03-23 DIAGNOSIS — Z9013 Acquired absence of bilateral breasts and nipples: Secondary | ICD-10-CM

## 2018-03-23 DIAGNOSIS — Z923 Personal history of irradiation: Secondary | ICD-10-CM

## 2018-03-23 DIAGNOSIS — C50412 Malignant neoplasm of upper-outer quadrant of left female breast: Secondary | ICD-10-CM | POA: Diagnosis not present

## 2018-03-23 DIAGNOSIS — Z170 Estrogen receptor positive status [ER+]: Secondary | ICD-10-CM | POA: Diagnosis not present

## 2018-03-23 DIAGNOSIS — C7951 Secondary malignant neoplasm of bone: Secondary | ICD-10-CM | POA: Diagnosis not present

## 2018-03-23 DIAGNOSIS — R918 Other nonspecific abnormal finding of lung field: Secondary | ICD-10-CM

## 2018-03-23 DIAGNOSIS — Z79811 Long term (current) use of aromatase inhibitors: Secondary | ICD-10-CM | POA: Diagnosis not present

## 2018-03-23 DIAGNOSIS — Z79899 Other long term (current) drug therapy: Secondary | ICD-10-CM | POA: Diagnosis not present

## 2018-03-23 DIAGNOSIS — Z5112 Encounter for antineoplastic immunotherapy: Secondary | ICD-10-CM | POA: Diagnosis not present

## 2018-03-23 DIAGNOSIS — Z95828 Presence of other vascular implants and grafts: Secondary | ICD-10-CM

## 2018-03-23 LAB — CBC WITH DIFFERENTIAL (CANCER CENTER ONLY)
Basophils Absolute: 0 10*3/uL (ref 0.0–0.1)
Basophils Relative: 1 %
Eosinophils Absolute: 0.2 10*3/uL (ref 0.0–0.5)
Eosinophils Relative: 3 %
HCT: 35.9 % (ref 34.8–46.6)
Hemoglobin: 12 g/dL (ref 11.6–15.9)
Lymphocytes Relative: 42 %
Lymphs Abs: 2.3 10*3/uL (ref 0.9–3.3)
MCH: 30.7 pg (ref 25.1–34.0)
MCHC: 33.6 g/dL (ref 31.5–36.0)
MCV: 91.5 fL (ref 79.5–101.0)
Monocytes Absolute: 0.6 10*3/uL (ref 0.1–0.9)
Monocytes Relative: 10 %
Neutro Abs: 2.4 10*3/uL (ref 1.5–6.5)
Neutrophils Relative %: 44 %
Platelet Count: 249 10*3/uL (ref 145–400)
RBC: 3.92 MIL/uL (ref 3.70–5.45)
RDW: 13.6 % (ref 11.2–14.5)
WBC Count: 5.5 10*3/uL (ref 3.9–10.3)

## 2018-03-23 LAB — CMP (CANCER CENTER ONLY)
ALT: 70 U/L — ABNORMAL HIGH (ref 0–55)
AST: 72 U/L — ABNORMAL HIGH (ref 5–34)
Albumin: 4.1 g/dL (ref 3.5–5.0)
Alkaline Phosphatase: 232 U/L — ABNORMAL HIGH (ref 40–150)
Anion gap: 10 (ref 3–11)
BUN: 11 mg/dL (ref 7–26)
CO2: 26 mmol/L (ref 22–29)
Calcium: 9 mg/dL (ref 8.4–10.4)
Chloride: 104 mmol/L (ref 98–109)
Creatinine: 0.71 mg/dL (ref 0.60–1.10)
GFR, Est AFR Am: >60 mL/min (ref >60)
GFR, Estimated: >60 mL/min (ref >60)
Glucose, Bld: 112 mg/dL (ref 70–140)
Potassium: 3.6 mmol/L (ref 3.5–5.1)
Sodium: 140 mmol/L (ref 136–145)
Total Bilirubin: 0.4 mg/dL (ref 0.2–1.2)
Total Protein: 7.3 g/dL (ref 6.4–8.3)

## 2018-03-23 MED ORDER — DIPHENHYDRAMINE HCL 25 MG PO CAPS
ORAL_CAPSULE | ORAL | Status: AC
  Filled 2018-03-23: qty 2

## 2018-03-23 MED ORDER — HEPARIN SOD (PORK) LOCK FLUSH 100 UNIT/ML IV SOLN
500.0000 [IU] | Freq: Once | INTRAVENOUS | Status: AC | PRN
  Administered 2018-03-23: 500 [IU]
  Filled 2018-03-23: qty 5

## 2018-03-23 MED ORDER — SODIUM CHLORIDE 0.9 % IV SOLN
Freq: Once | INTRAVENOUS | Status: AC
  Administered 2018-03-23: 09:00:00 via INTRAVENOUS

## 2018-03-23 MED ORDER — ACETAMINOPHEN 325 MG PO TABS
ORAL_TABLET | ORAL | Status: AC
  Filled 2018-03-23: qty 2

## 2018-03-23 MED ORDER — SODIUM CHLORIDE 0.9% FLUSH
10.0000 mL | INTRAVENOUS | Status: DC | PRN
  Administered 2018-03-23: 10 mL
  Filled 2018-03-23: qty 10

## 2018-03-23 MED ORDER — DIPHENHYDRAMINE HCL 25 MG PO CAPS
50.0000 mg | ORAL_CAPSULE | Freq: Once | ORAL | Status: AC
  Administered 2018-03-23: 50 mg via ORAL

## 2018-03-23 MED ORDER — SODIUM CHLORIDE 0.9% FLUSH
10.0000 mL | Freq: Once | INTRAVENOUS | Status: AC
  Administered 2018-03-23: 10 mL
  Filled 2018-03-23: qty 10

## 2018-03-23 MED ORDER — SODIUM CHLORIDE 0.9 % IV SOLN
3.8000 mg/kg | Freq: Once | INTRAVENOUS | Status: AC
  Administered 2018-03-23: 260 mg via INTRAVENOUS
  Filled 2018-03-23: qty 8

## 2018-03-23 MED ORDER — ACETAMINOPHEN 325 MG PO TABS
650.0000 mg | ORAL_TABLET | Freq: Once | ORAL | Status: AC
  Administered 2018-03-23: 650 mg via ORAL

## 2018-03-23 NOTE — Assessment & Plan Note (Signed)
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 Was on Arimidex since 08/22/2012 to 03/30/16  Subcutaneous nodule excisionleft chest: Infiltrating carcinoma breast primary, ER positive, PR negative, HER-2 positive  009/03/2017:Left chest wall nodule excision recurrent breast cancer  CT CAP: 08/11/17: Open wound. Diffuse bone mets. Bone Scan 08/11/17: Progression. Left femur mets are new. Existing lesions show inc activity Echo 01/22/18: EF 55-60%  Prior treatment: Herceptinand Perjeta(added 09/22/2017)Q 4 weeks, Faslodex, Zometa, started 04/28/2016   CT CAP:02/05/2018: New 5 mm right lower lobe pulmonary nodule suspicious for new pulmonary metastatic disease. Significant progression of bone metastatic disease. Bone scan: 02/05/2018: Progressive bone metastatic disease with new uptake at L2, posterior right eighth rib, proximal right humerus --------------------------------------------------------------------------- Current treatment: Kadcyla today is cycle 1 every 3 weeks  Kadcyla toxicities:  Monitoring closely for toxicities Labs have been reviewed Return to clinic in 3 weeks for cycle 3 

## 2018-03-23 NOTE — Progress Notes (Signed)
Per Dr Lindi Adie ok to tx with elevated AST and ALT.

## 2018-03-23 NOTE — Patient Instructions (Signed)
[facility] Discharge Instructions for Patients Receiving Chemotherapy  Today you received the following chemotherapy agents Kadcyla  To help prevent nausea and vomiting after your treatment, we encourage you to take your nausea medication as directed   If you develop nausea and vomiting that is not controlled by your nausea medication, call the clinic.   BELOW ARE SYMPTOMS THAT SHOULD BE REPORTED IMMEDIATELY:  *FEVER GREATER THAN 100.5 F  *CHILLS WITH OR WITHOUT FEVER  NAUSEA AND VOMITING THAT IS NOT CONTROLLED WITH YOUR NAUSEA MEDICATION  *UNUSUAL SHORTNESS OF BREATH  *UNUSUAL BRUISING OR BLEEDING  TENDERNESS IN MOUTH AND THROAT WITH OR WITHOUT PRESENCE OF ULCERS  *URINARY PROBLEMS  *BOWEL PROBLEMS  UNUSUAL RASH Items with * indicate a potential emergency and should be followed up as soon as possible.  Feel free to call the clinic should you have any questions or concerns. The clinic phone number is [phone].  Please show the CHEMO ALERT CARD at check-in to the Emergency Department and triage nurse.   Ado-Trastuzumab Emtansine (Kadcyla) for injection What is this medicine? ADO-TRASTUZUMAB EMTANSINE (ADD oh traz TOO zuh mab em TAN zine) is a monoclonal antibody combined with chemotherapy. It is used to treat breast cancer. This medicine may be used for other purposes; ask your health care provider or pharmacist if you have questions. COMMON BRAND NAME(S): Kadcyla What should I tell my health care provider before I take this medicine? They need to know if you have any of these conditions: -heart disease -heart failure -infection (especially a virus infection such as chickenpox, cold sores, or herpes) -liver disease -lung or breathing disease, like asthma -an unusual or allergic reaction to ado-trastuzumab emtansine, other medications, foods, dyes, or preservatives -pregnant or trying to get pregnant -breast-feeding How should I use this  medicine? This medicine is for infusion into a vein. It is given by a health care professional in a hospital or clinic setting. Talk to your pediatrician regarding the use of this medicine in children. Special care may be needed. Overdosage: If you think you have taken too much of this medicine contact a poison control center or emergency room at once. NOTE: This medicine is only for you. Do not share this medicine with others. What if I miss a dose? It is important not to miss your dose. Call your doctor or health care professional if you are unable to keep an appointment. What may interact with this medicine? This medicine may also interact with the following medications: -atazanavir -boceprevir -clarithromycin -delavirdine -indinavir -dalfopristin; quinupristin -isoniazid, INH -itraconazole -ketoconazole -nefazodone -nelfinavir -ritonavir -telaprevir -telithromycin -tipranavir -voriconazole This list may not describe all possible interactions. Give your health care provider a list of all the medicines, herbs, non-prescription drugs, or dietary supplements you use. Also tell them if you smoke, drink alcohol, or use illegal drugs. Some items may interact with your medicine. What should I watch for while using this medicine? Visit your doctor for checks on your progress. This drug may make you feel generally unwell. This is not uncommon, as chemotherapy can affect healthy cells as well as cancer cells. Report any side effects. Continue your course of treatment even though you feel ill unless your doctor tells you to stop. You may need blood work done while you are taking this medicine. Call your doctor or health care professional for advice if you get a fever, chills or sore throat, or other symptoms of a cold or flu. Do not treat yourself. This drug decreases   your body's ability to fight infections. Try to avoid being around people who are sick. Be careful brushing and flossing your  teeth or using a toothpick because you may get an infection or bleed more easily. If you have any dental work done, tell your dentist you are receiving this medicine. Avoid taking products that contain aspirin, acetaminophen, ibuprofen, naproxen, or ketoprofen unless instructed by your doctor. These medicines may hide a fever. Do not become pregnant while taking this medicine or for 7 months after stopping it, men with female partners should use contraception during treatment and for 4 months after the last dose. Women should inform their doctor if they wish to become pregnant or think they might be pregnant. There is a potential for serious side effects to an unborn child. Do not breast-feed an infant while taking this medicine or for 7 months after the last dose. Men who have a partner who is pregnant or who is capable of becoming pregnant should use a condom during sexual activity while taking this medicine and for 4 months after stopping it. Men should inform their doctors if they wish to father a child. This medicine may lower sperm counts. Talk to your health care professional or pharmacist for more information. What side effects may I notice from receiving this medicine? Side effects that you should report to your doctor or health care professional as soon as possible: -allergic reactions like skin rash, itching or hives, swelling of the face, lips, or tongue -breathing problems -chest pain or palpitations -fever or chills, sore throat -general ill feeling or flu-like symptoms -light-colored stools -nausea, vomiting -pain, tingling, numbness in the hands or feet -signs and symptoms of bleeding such as bloody or black, tarry stools; red or dark-brown urine; spitting up blood or brown material that looks like coffee grounds; red spots on the skin; unusual bruising or bleeding from the eye, gums, or nose -swelling of the legs or ankles -yellowing of the eyes or skin Side effects that usually do  not require medical attention (report to your doctor or health care professional if they continue or are bothersome): -changes in taste -constipation -dizziness -headache -joint pain -muscle pain -trouble sleeping -unusually weak or tired This list may not describe all possible side effects. Call your doctor for medical advice about side effects. You may report side effects to FDA at 1-800-FDA-1088. Where should I keep my medicine? This drug is given in a hospital or clinic and will not be stored at home. NOTE: This sheet is a summary. It may not cover all possible information. If you have questions about this medicine, talk to your doctor, pharmacist, or health care provider.  2018 [employer]vier/Gold Standard (2015-11-23 12:11:06)   

## 2018-03-23 NOTE — Progress Notes (Signed)
Patient Care Team: [name], MD as PCP - General (Family Medicine)  DIAGNOSIS:  Encounter Diagnosis  Name Primary?  . Malignant neoplasm of upper-outer quadrant of left breast in female, estrogen receptor positive ([place])     SUMMARY OF ONCOLOGIC HISTORY:   Breast cancer of upper-outer quadrant of left female breast ([place])   11/14/2011 Surgery    Bilateral mastectomy, prophylactic on the right, left breast IDC 3/18 lymph nodes positive with extracapsular extension ER 89%, PR 81%, HER-2 negative, Ki-67 79% T2 N1 A. stage IIB      12/13/2011 - 06/28/2012 Chemotherapy    4 cycles of FEC followed by 4 cycles of Taxotere      07/17/2012 - 08/22/2012 Radiation Therapy    Adjuvant radiation therapy      08/22/2012 - 03/16/2016 Anti-estrogen oral therapy    Arimidex 1 mg daily      03/16/2016 Relapse/Recurrence    Subcutaneous nodule excision left chest: Infiltrating carcinoma breast primary, ER positive, PR negative      03/29/2016 Imaging    CT CAP and bone scan: Lytic lesions T8 vertebral, T1 posterior element, subcutaneous nodule left lateral chest wall, nonspecific lung nodules; Bone scan: Mets to kull, left humerus, left eighth rib, T7/T8, sternum, left acetabulum      04/28/2016 - 06/17/2017 Chemotherapy    Herceptin, lapatinib, Faslodex, Zometa every 4 weeks, lapatinib discontinued in September 2018 due to elevation of LFTs      06/22/2017 Relapse/Recurrence    Surgical excision:Soft tissue mass left lateral chest wall primary breast cancer, soft tissue mass left medial chest wall breast cancer, tumor is within the dermis extending to the subcutaneous adipose tissue and involves portions of skeletal muscle      08/2017 - 02/16/2018 Chemotherapy    Faslodex with Herceptin and Perjeta along with Zometa every 4 weeks       03/02/2018 -  Chemotherapy    Kadcyla        CHIEF COMPLIANT: Follow-up on Kadcyla cycle 2  INTERVAL HISTORY: [name] Riley is a 67 year old with  above-mentioned history of left breast cancer who has metastatic disease and is currently on palliative chemotherapy with Kadcyla.  She is here for cycle 2 of treatment.  She appears to have done quite well with cycle 1.  Denies any nausea or vomiting.  Denies any diarrhea  REVIEW OF SYSTEMS:   Constitutional: Denies fevers, chills or abnormal weight loss Eyes: Denies blurriness of vision Ears, nose, mouth, throat, and face: Denies mucositis or sore throat Respiratory: Denies cough, dyspnea or wheezes Cardiovascular: Denies palpitation, chest discomfort Gastrointestinal:  Denies nausea, heartburn or change in bowel habits Skin: Denies abnormal skin rashes Lymphatics: Denies new lymphadenopathy or easy bruising Neurological:Denies numbness, tingling or new weaknesses Behavioral/Psych: Mood is stable, no new changes  Extremities: No lower extremity edema  All other systems were reviewed with the patient and are negative.  I have reviewed the past medical history, past surgical history, social history and family history with the patient and they are unchanged from previous note.  ALLERGIES:  is allergic to contrast media [iodinated diagnostic agents]; food; pravastatin; and zosyn [piperacillin sod-tazobactam so].  MEDICATIONS:  Current Outpatient Medications  Medication Sig Dispense Refill  . acetaminophen (TYLENOL) 500 MG tablet Take 1,000 mg by mouth every 6 (six) hours as needed for moderate pain.    Marland Kitchen aspirin 81 MG tablet Take 81 mg by mouth daily.      Marland Kitchen atorvastatin (LIPITOR) 20 MG tablet Take by  mouth.    . ergocalciferol (VITAMIN D2) 50000 units capsule Take 50,000 Units by mouth every Monday.     . gabapentin (NEURONTIN) 600 MG tablet Take by mouth.     . levothyroxine (SYNTHROID, LEVOTHROID) 88 MCG tablet Take by mouth.    . lidocaine-prilocaine (EMLA) cream Apply topically as needed (for port access). Apply to affected area once 30 g 3  . metoprolol succinate (TOPROL-XL) 100 MG  24 hr tablet Take 1 tablet (100 mg total) by mouth daily. 90 tablet 3  . Multiple Vitamins-Minerals (MULTIVITAMIN WITH MINERALS) tablet Take 1 tablet by mouth daily.      . ondansetron (ZOFRAN-ODT) 4 MG disintegrating tablet Take 1 tablet (4 mg total) by mouth every 6 (six) hours as needed. 20 tablet 3   No current facility-administered medications for this visit.    Facility-Administered Medications Ordered in Other Visits  Medication Dose Route Frequency Provider Last Rate Last Dose  . 0.9 %  sodium chloride infusion   Intravenous Continuous Donnie Mesa, MD      . acetaminophen (TYLENOL) tablet 975 mg  975 mg Oral Q6H Donnie Mesa, MD      . enoxaparin (LOVENOX) injection 40 mg  40 mg Subcutaneous Q24H Donnie Mesa, MD      . morphine 4 MG/ML injection 2-4 mg  2-4 mg Intravenous Q2H PRN Donnie Mesa, MD      . ondansetron (ZOFRAN-ODT) disintegrating tablet 4 mg  4 mg Oral Q6H PRN Donnie Mesa, MD       Or  . ondansetron (ZOFRAN) 4 mg in sodium chloride 0.9 % 50 mL IVPB  4 mg Intravenous Q6H PRN Donnie Mesa, MD      . oxyCODONE (Oxy IR/ROXICODONE) immediate release tablet 5-10 mg  5-10 mg Oral Q4H PRN Donnie Mesa, MD      . piperacillin-tazobactam (ZOSYN) 3.375 g in dextrose 5 % 50 mL IVPB  3.375 g Intravenous Q8H Donnie Mesa, MD      . prochlorperazine (COMPAZINE) tablet 10 mg  10 mg Oral Q6H PRN Donnie Mesa, MD       Or  . prochlorperazine (COMPAZINE) injection 5-10 mg  5-10 mg Intravenous Q6H PRN Donnie Mesa, MD        PHYSICAL EXAMINATION: ECOG PERFORMANCE STATUS: 1 - Symptomatic but completely ambulatory  Vitals:   03/23/18 0820  BP: (!) 163/92  Pulse: 75  Resp: 17  Temp: 98.7 F (37.1 C)  SpO2: 100%   Filed Weights   03/23/18 0820  Weight: 150 lb 9.6 oz (68.3 kg)    GENERAL:alert, no distress and comfortable SKIN: skin color, texture, turgor are normal, no rashes or significant lesions EYES: normal, Conjunctiva are pink and non-injected,  sclera clear OROPHARYNX:no exudate, no erythema and lips, buccal mucosa, and tongue normal  NECK: supple, thyroid normal size, non-tender, without nodularity LYMPH:  no palpable lymphadenopathy in the cervical, axillary or inguinal LUNGS: clear to auscultation and percussion with normal breathing effort HEART: regular rate & rhythm and no murmurs and no lower extremity edema ABDOMEN:abdomen soft, non-tender and normal bowel sounds MUSCULOSKELETAL:no cyanosis of digits and no clubbing  NEURO: alert & oriented x 3 with fluent speech, no focal motor/sensory deficits EXTREMITIES: No lower extremity edema  LABORATORY DATA:  I have reviewed the data as listed CMP Latest Ref Rng & Units 03/02/2018 02/15/2018 02/09/2018  Glucose 70 - 140 mg/dL 113 117 130  BUN 7 - 26 mg/dL _0 Creatinine 0.60 - 1.10 mg/dL 0.70 0.71 0.68  Sodium 136 - 145 mmol/L 140 140 140  Potassium 3.5 - 5.1 mmol/L 3.5 3.4(L) 3.5  Chloride 98 - 109 mmol/L 105 104 103  CO2 22 - 29 mmol/L _0 Calcium 8.4 - 10.4 mg/dL 9.0 9.8 9.7  Total Protein 6.4 - 8.3 g/dL 7.3 7.3 7.0  Total Bilirubin 0.2 - 1.2 mg/dL 0.4 0.5 0.4  Alkaline Phos 40 - 150 U/L 160(H) 124 132  AST 5 - 34 U/L 36(H) 38(H) 45(H)  ALT 0 - 55 U/L 35 36 42    Lab Results  Component Value Date   WBC 5.5 03/23/2018   HGB 12.0 03/23/2018   HCT 35.9 03/23/2018   MCV 91.5 03/23/2018   PLT 249 03/23/2018   NEUTROABS 2.4 03/23/2018    ASSESSMENT & PLAN:  Breast cancer of upper-outer quadrant of left female breast ([place]) Left breast invasive ductal carcinomaT2, N1, 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 Was on Arimidex since 08/22/2012 to 03/30/16  Subcutaneous nodule excisionleft chest: Infiltrating carcinoma breast primary, ER positive, PR negative, HER-2 positive  009/03/2017:Left chest wall nodule  excision recurrent breast cancer  CT CAP: 08/11/17: Open wound. Diffuse bone mets. Bone Scan 08/11/17: Progression. Left femur mets are new. Existing lesions show inc activity Echo 01/22/18: EF 55-60%  Prior treatment: Herceptinand Perjeta(added 09/22/2017)Q 4 weeks, Faslodex, Zometa, started 04/28/2016   CT CAP:02/05/2018: New 5 mm right lower lobe pulmonary nodule suspicious for new pulmonary metastatic disease. Significant progression of bone metastatic disease. Bone scan: 02/05/2018: Progressive bone metastatic disease with new uptake at L2, posterior right eighth rib, proximal right humerus --------------------------------------------------------------------------- Current treatment: Kadcyla today is cycle 1 every 3 weeks  Kadcyla toxicities:  Monitoring closely for toxicities Labs have been reviewed Return to clinic in 3 weeks for cycle 3    No orders of the defined types were placed in this encounter.  The patient has a good understanding of the overall plan. she agrees with it. she will call with any problems that may develop before the next visit here.   Harriette Ohara, MD 03/23/18

## 2018-04-13 ENCOUNTER — Inpatient Hospital Stay (HOSPITAL_BASED_OUTPATIENT_CLINIC_OR_DEPARTMENT_OTHER): Payer: Medicare Other | Admitting: Hematology and Oncology

## 2018-04-13 ENCOUNTER — Inpatient Hospital Stay: Payer: Medicare Other

## 2018-04-13 VITALS — BP 162/88 | HR 79 | Temp 98.5°F | Resp 16 | Ht 68.0 in | Wt 148.8 lb

## 2018-04-13 DIAGNOSIS — Z170 Estrogen receptor positive status [ER+]: Secondary | ICD-10-CM

## 2018-04-13 DIAGNOSIS — Z923 Personal history of irradiation: Secondary | ICD-10-CM

## 2018-04-13 DIAGNOSIS — C50412 Malignant neoplasm of upper-outer quadrant of left female breast: Secondary | ICD-10-CM | POA: Diagnosis not present

## 2018-04-13 DIAGNOSIS — Z7982 Long term (current) use of aspirin: Secondary | ICD-10-CM

## 2018-04-13 DIAGNOSIS — C7951 Secondary malignant neoplasm of bone: Secondary | ICD-10-CM

## 2018-04-13 DIAGNOSIS — Z79899 Other long term (current) drug therapy: Secondary | ICD-10-CM

## 2018-04-13 DIAGNOSIS — Z9013 Acquired absence of bilateral breasts and nipples: Secondary | ICD-10-CM

## 2018-04-13 DIAGNOSIS — Z5112 Encounter for antineoplastic immunotherapy: Secondary | ICD-10-CM | POA: Diagnosis not present

## 2018-04-13 DIAGNOSIS — Z95828 Presence of other vascular implants and grafts: Secondary | ICD-10-CM

## 2018-04-13 DIAGNOSIS — Z9221 Personal history of antineoplastic chemotherapy: Secondary | ICD-10-CM

## 2018-04-13 LAB — CBC WITH DIFFERENTIAL (CANCER CENTER ONLY)
Basophils Absolute: 0 10*3/uL (ref 0.0–0.1)
Basophils Relative: 1 %
Eosinophils Absolute: 0.1 10*3/uL (ref 0.0–0.5)
Eosinophils Relative: 2 %
HCT: 37.5 % (ref 34.8–46.6)
Hemoglobin: 12.4 g/dL (ref 11.6–15.9)
Lymphocytes Relative: 38 %
Lymphs Abs: 1.9 10*3/uL (ref 0.9–3.3)
MCH: 30.6 pg (ref 25.1–34.0)
MCHC: 33.1 g/dL (ref 31.5–36.0)
MCV: 92.6 fL (ref 79.5–101.0)
Monocytes Absolute: 0.6 10*3/uL (ref 0.1–0.9)
Monocytes Relative: 13 %
Neutro Abs: 2.3 10*3/uL (ref 1.5–6.5)
Neutrophils Relative %: 46 %
Platelet Count: 266 10*3/uL (ref 145–400)
RBC: 4.05 MIL/uL (ref 3.70–5.45)
RDW: 13.4 % (ref 11.2–14.5)
WBC Count: 5 10*3/uL (ref 3.9–10.3)

## 2018-04-13 LAB — COMPREHENSIVE METABOLIC PANEL
ALT: 52 U/L — ABNORMAL HIGH (ref 0–44)
AST: 59 U/L — ABNORMAL HIGH (ref 15–41)
Albumin: 4 g/dL (ref 3.5–5.0)
Alkaline Phosphatase: 223 U/L — ABNORMAL HIGH (ref 38–126)
Anion gap: 9 (ref 5–15)
BUN: 8 mg/dL (ref 8–23)
CO2: 27 mmol/L (ref 22–32)
Calcium: 8.5 mg/dL — ABNORMAL LOW (ref 8.9–10.3)
Chloride: 106 mmol/L (ref 98–111)
Creatinine, Ser: 0.53 mg/dL (ref 0.44–1.00)
GFR calc Af Amer: >60 mL/min (ref >60)
GFR calc non Af Amer: >60 mL/min (ref >60)
Glucose, Bld: 91 mg/dL (ref 70–99)
Potassium: 3.7 mmol/L (ref 3.5–5.1)
Sodium: 142 mmol/L (ref 135–145)
Total Bilirubin: 0.6 mg/dL (ref 0.3–1.2)
Total Protein: 7.6 g/dL (ref 6.5–8.1)

## 2018-04-13 MED ORDER — ACETAMINOPHEN 325 MG PO TABS
650.0000 mg | ORAL_TABLET | Freq: Once | ORAL | Status: AC
  Administered 2018-04-13: 650 mg via ORAL

## 2018-04-13 MED ORDER — SODIUM CHLORIDE 0.9% FLUSH
10.0000 mL | INTRAVENOUS | Status: DC | PRN
  Administered 2018-04-13: 10 mL
  Filled 2018-04-13: qty 10

## 2018-04-13 MED ORDER — SODIUM CHLORIDE 0.9% FLUSH
10.0000 mL | Freq: Once | INTRAVENOUS | Status: AC
  Administered 2018-04-13: 10 mL
  Filled 2018-04-13: qty 10

## 2018-04-13 MED ORDER — SODIUM CHLORIDE 0.9 % IV SOLN
3.8000 mg/kg | Freq: Once | INTRAVENOUS | Status: AC
  Administered 2018-04-13: 260 mg via INTRAVENOUS
  Filled 2018-04-13: qty 8

## 2018-04-13 MED ORDER — VITAMIN D 1000 UNITS PO TABS: 1000.0000 [IU] | ORAL_TABLET | Freq: Every day | ORAL | Status: DC

## 2018-04-13 MED ORDER — ACETAMINOPHEN 325 MG PO TABS
ORAL_TABLET | ORAL | Status: AC
  Filled 2018-04-13: qty 2

## 2018-04-13 MED ORDER — DIPHENHYDRAMINE HCL 25 MG PO CAPS
ORAL_CAPSULE | ORAL | Status: AC
  Filled 2018-04-13: qty 2

## 2018-04-13 MED ORDER — SODIUM CHLORIDE 0.9 % IV SOLN
Freq: Once | INTRAVENOUS | Status: AC
  Administered 2018-04-13: 10:00:00 via INTRAVENOUS

## 2018-04-13 MED ORDER — HEPARIN SOD (PORK) LOCK FLUSH 100 UNIT/ML IV SOLN
500.0000 [IU] | Freq: Once | INTRAVENOUS | Status: AC | PRN
  Administered 2018-04-13: 500 [IU]
  Filled 2018-04-13: qty 5

## 2018-04-13 MED ORDER — DIPHENHYDRAMINE HCL 25 MG PO CAPS
50.0000 mg | ORAL_CAPSULE | Freq: Once | ORAL | Status: AC
  Administered 2018-04-13: 50 mg via ORAL

## 2018-04-13 NOTE — Assessment & Plan Note (Signed)
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 Was on Arimidex since 08/22/2012 to 03/30/16  Subcutaneous nodule excisionleft chest: Infiltrating carcinoma breast primary, ER positive, PR negative, HER-2 positive  009/03/2017:Left chest wall nodule excision recurrent breast cancer  CT CAP: 08/11/17: Open wound. Diffuse bone mets. Bone Scan 08/11/17: Progression. Left femur mets are new. Existing lesions show inc activity Echo 01/22/18: EF 55-60%  Priortreatment: Herceptinand Perjeta(added 09/22/2017)Q 4 weeks, Faslodex, Zometa, started 04/28/2016   CT CAP:02/05/2018: New 5 mm right lower lobe pulmonary nodule suspicious for new pulmonary metastatic disease. Significant progression of bone metastatic disease. Bone scan: 02/05/2018: Progressive bone metastatic disease with new uptake at L2, posterior right eighth rib, proximal right humerus --------------------------------------------------------------------------- Current treatment: Kadcyla today is cycle 3 every 3 weeks  Kadcyla toxicities: Denies any nausea or vomiting.  Has mild to moderate fatigue.  Monitoring closely for toxicities Labs have been reviewed Return to clinic in 3 weeks for cycle 4 with scans done prior to that visit.

## 2018-04-13 NOTE — Progress Notes (Signed)
Patient Care Team: [name], MD as PCP - General (Family Medicine)  DIAGNOSIS:  Encounter Diagnosis  Name Primary?  . Malignant neoplasm of upper-outer quadrant of left breast in female, estrogen receptor positive ([place]) Yes    SUMMARY OF ONCOLOGIC HISTORY:   Breast cancer of upper-outer quadrant of left female breast ([place] )   11/14/2011 Surgery    Bilateral mastectomy, prophylactic on the right, left breast IDC 3/18 lymph nodes positive with extracapsular extension ER 89%, PR 81%, HER-2 negative, Ki-67 79% T2 N1 A. stage IIB      12/13/2011 - 06/28/2012 Chemotherapy    4 cycles of FEC followed by 4 cycles of Taxotere      07/17/2012 - 08/22/2012 Radiation Therapy    Adjuvant radiation therapy      08/22/2012 - 03/16/2016 Anti-estrogen oral therapy    Arimidex 1 mg daily      03/16/2016 Relapse/Recurrence    Subcutaneous nodule excision left chest: Infiltrating carcinoma breast primary, ER positive, PR negative      03/29/2016 Imaging    CT CAP and bone scan: Lytic lesions T8 vertebral, T1 posterior element, subcutaneous nodule left lateral chest wall, nonspecific lung nodules; Bone scan: Mets to kull, left humerus, left eighth rib, T7/T8, sternum, left acetabulum      04/28/2016 - 06/17/2017 Chemotherapy    Herceptin, lapatinib, Faslodex, Zometa every 4 weeks, lapatinib discontinued in September 2018 due to elevation of LFTs      06/22/2017 Relapse/Recurrence    Surgical excision:Soft tissue mass left lateral chest wall primary breast cancer, soft tissue mass left medial chest wall breast cancer, tumor is within the dermis extending to the subcutaneous adipose tissue and involves portions of skeletal muscle      08/2017 - 02/16/2018 Chemotherapy    Faslodex with Herceptin and Perjeta along with Zometa every 4 weeks       03/02/2018 -  Chemotherapy    Kadcyla       04/12/2018 -  Chemotherapy    The patient had ado-trastuzumab emtansine (KADCYLA) 260 mg in sodium chloride  0.9 % 250 mL chemo infusion, 3.8 mg/kg = 240 mg, Intravenous, Once, 1 of 5 cycles Administration: 260 mg (04/13/2018)  for chemotherapy treatment.        CHIEF COMPLIANT: Cycle 3 Kadcyla  INTERVAL HISTORY: [name] Riley is a 67 year old with above-mentioned history of metastatic breast cancer who is currently on Kadcyla.  Today is cycle 3 of her treatment.  She has tolerated chemotherapy extremely well.  She does not have any nausea or vomiting.  She does have mild fatigue.  REVIEW OF SYSTEMS:   Constitutional: Denies fevers, chills or abnormal weight loss Eyes: Denies blurriness of vision Ears, nose, mouth, throat, and face: Denies mucositis or sore throat Respiratory: Denies cough, dyspnea or wheezes Cardiovascular: Denies palpitation, chest discomfort Gastrointestinal:  Denies nausea, heartburn or change in bowel habits Skin: Denies abnormal skin rashes Lymphatics: Denies new lymphadenopathy or easy bruising Neurological:Denies numbness, tingling or new weaknesses Behavioral/Psych: Mood is stable, no new changes  Extremities: No lower extremity edema All other systems were reviewed with the patient and are negative.  I have reviewed the past medical history, past surgical history, social history and family history with the patient and they are unchanged from previous note.  ALLERGIES:  is allergic to contrast media [iodinated diagnostic agents]; food; pravastatin; and zosyn [piperacillin sod-tazobactam so].  MEDICATIONS:  Current Outpatient Medications  Medication Sig Dispense Refill  . acetaminophen (TYLENOL) 500 MG tablet Take 1,000  mg by mouth every 6 (six) hours as needed for moderate pain.    Marland Kitchen aspirin 81 MG tablet Take 81 mg by mouth daily.      Marland Kitchen atorvastatin (LIPITOR) 20 MG tablet Take by mouth.    . cholecalciferol (VITAMIN D) 1000 units tablet Take 1 tablet (1,000 Units total) by mouth daily.    Marland Kitchen levothyroxine (SYNTHROID, LEVOTHROID) 88 MCG tablet Take by mouth.      . lidocaine-prilocaine (EMLA) cream Apply topically as needed (for port access). Apply to affected area once 30 g 3  . metoprolol succinate (TOPROL-XL) 100 MG 24 hr tablet Take 1 tablet (100 mg total) by mouth daily. 90 tablet 3  . Multiple Vitamins-Minerals (MULTIVITAMIN WITH MINERALS) tablet Take 1 tablet by mouth daily.      . ondansetron (ZOFRAN-ODT) 4 MG disintegrating tablet Take 1 tablet (4 mg total) by mouth every 6 (six) hours as needed. 20 tablet 3   No current facility-administered medications for this visit.    Facility-Administered Medications Ordered in Other Visits  Medication Dose Route Frequency Provider Last Rate Last Dose  . 0.9 %  sodium chloride infusion   Intravenous Continuous Donnie Mesa, MD      . acetaminophen (TYLENOL) tablet 975 mg  975 mg Oral Q6H Donnie Mesa, MD      . enoxaparin (LOVENOX) injection 40 mg  40 mg Subcutaneous Q24H Donnie Mesa, MD      . morphine 4 MG/ML injection 2-4 mg  2-4 mg Intravenous Q2H PRN Donnie Mesa, MD      . ondansetron (ZOFRAN-ODT) disintegrating tablet 4 mg  4 mg Oral Q6H PRN Donnie Mesa, MD       Or  . ondansetron (ZOFRAN) 4 mg in sodium chloride 0.9 % 50 mL IVPB  4 mg Intravenous Q6H PRN Donnie Mesa, MD      . oxyCODONE (Oxy IR/ROXICODONE) immediate release tablet 5-10 mg  5-10 mg Oral Q4H PRN Donnie Mesa, MD      . piperacillin-tazobactam (ZOSYN) 3.375 g in dextrose 5 % 50 mL IVPB  3.375 g Intravenous Q8H Donnie Mesa, MD      . prochlorperazine (COMPAZINE) tablet 10 mg  10 mg Oral Q6H PRN Donnie Mesa, MD       Or  . prochlorperazine (COMPAZINE) injection 5-10 mg  5-10 mg Intravenous Q6H PRN Donnie Mesa, MD      . sodium chloride flush (NS) 0.9 % injection 10 mL  10 mL Intracatheter PRN Nicholas Lose, MD   10 mL at 04/13/18 1213    PHYSICAL EXAMINATION: ECOG PERFORMANCE STATUS: 1 - Symptomatic but completely ambulatory  Vitals:   04/13/18 0910  BP: (!) 162/88  Pulse: 79  Resp: 16  Temp: 98.5  F (36.9 C)  SpO2: 100%   Filed Weights   04/13/18 0910  Weight: 148 lb 12.8 oz (67.5 kg)    GENERAL:alert, no distress and comfortable SKIN: skin color, texture, turgor are normal, no rashes or significant lesions EYES: normal, Conjunctiva are pink and non-injected, sclera clear OROPHARYNX:no exudate, no erythema and lips, buccal mucosa, and tongue normal  NECK: supple, thyroid normal size, non-tender, without nodularity LYMPH:  no palpable lymphadenopathy in the cervical, axillary or inguinal LUNGS: clear to auscultation and percussion with normal breathing effort HEART: regular rate & rhythm and no murmurs and no lower extremity edema ABDOMEN:abdomen soft, non-tender and normal bowel sounds MUSCULOSKELETAL:no cyanosis of digits and no clubbing  NEURO: alert & oriented x 3 with fluent speech, no focal  motor/sensory deficits EXTREMITIES: No lower extremity edema  LABORATORY DATA:  I have reviewed the data as listed CMP Latest Ref Rng & Units 04/13/2018 03/23/2018 03/02/2018  Glucose 70 - 99 mg/dL 91 112 113  BUN 8 - 23 mg/dL 8 11 15   Creatinine 0.44 - 1.00 mg/dL 0.53 0.71 0.70  Sodium 135 - 145 mmol/L 142 140 140  Potassium 3.5 - 5.1 mmol/L 3.7 3.6 3.5  Chloride 98 - 111 mmol/L 106 104 105  CO2 22 - 32 mmol/L 27 26 28   Calcium 8.9 - 10.3 mg/dL 8.5(L) 9.0 9.0  Total Protein 6.5 - 8.1 g/dL 7.6 7.3 7.3  Total Bilirubin 0.3 - 1.2 mg/dL 0.6 0.4 0.4  Alkaline Phos 38 - 126 U/L 223(H) 232(H) 160(H)  AST 15 - 41 U/L 59(H) 72(H) 36(H)  ALT 0 - 44 U/L 52(H) 70(H) 35    Lab Results  Component Value Date   WBC 5.0 04/13/2018   HGB 12.4 04/13/2018   HCT 37.5 04/13/2018   MCV 92.6 04/13/2018   PLT 266 04/13/2018   NEUTROABS 2.3 04/13/2018    ASSESSMENT & PLAN:  Breast cancer of upper-outer quadrant of left female breast ([place]) Left breast invasive ductal carcinomaT2, N1, M0 stage IIB 3 of 18 lymph nodes positive with extracapsular extension ER 89% PR 81% HER-2 negative Ki-67 79%  status post 4 cycles of FEC and 4 cycles of Taxotere and adjuvant radiation. Was on Arimidex since 08/22/2012 to 03/30/16  Subcutaneous nodule excisionleft chest: Infiltrating carcinoma breast primary, ER positive, PR negative, HER-2 positive  009/03/2017:Left chest wall nodule excision recurrent breast cancer  CT CAP: 08/11/17: Open wound. Diffuse bone mets. Bone Scan 08/11/17: Progression. Left femur mets are new. Existing lesions show inc activity Echo 01/22/18: EF 55-60%  Priortreatment: Herceptinand Perjeta(added 09/22/2017)Q 4 weeks, Faslodex, Zometa, started 04/28/2016   CT CAP:02/05/2018: New 5 mm right lower lobe pulmonary nodule suspicious for new pulmonary metastatic disease. Significant progression of bone metastatic disease. Bone scan: 02/05/2018: Progressive bone metastatic disease with new uptake at L2, posterior right eighth rib, proximal right humerus --------------------------------------------------------------------------- Current treatment: Kadcyla today is cycle 3 every 3 weeks  Kadcyla toxicities: Denies any nausea or vomiting.  Has mild to moderate fatigue.  Monitoring closely for toxicities Labs have been reviewed Return to clinic in 3 weeks for cycle 4 with scans done prior to that visit.  Orders Placed This Encounter  Procedures  . Comprehensive metabolic panel  . CBC with Differential (Cancer Center Only)    Standing Status:   Standing    Number of Occurrences:   20    Standing Expiration Date:   04/14/2019  . CMP ([facility] only)    Standing Status:   Standing    Number of Occurrences:   20    Standing Expiration Date:   04/14/2019  . PHYSICIAN COMMUNICATION ORDER    A baseline Echo/[place] should be obtained prior to initiation of Herceptin, at 3, 6, 9 months during Herceptin  Treatment.   The patient has a good understanding of the overall plan. she agrees with it. she will call with any problems that may develop before the next visit  here.   Harriette Ohara, MD 04/13/18

## 2018-04-16 ENCOUNTER — Telehealth: Payer: Self-pay | Admitting: Hematology and Oncology

## 2018-04-16 NOTE — Telephone Encounter (Signed)
Spoke to patient regarding upcoming appointments.

## 2018-05-04 ENCOUNTER — Inpatient Hospital Stay: Payer: Medicare Other

## 2018-05-04 ENCOUNTER — Inpatient Hospital Stay: Payer: Medicare Other | Attending: Hematology and Oncology

## 2018-05-04 ENCOUNTER — Inpatient Hospital Stay (HOSPITAL_BASED_OUTPATIENT_CLINIC_OR_DEPARTMENT_OTHER): Payer: Medicare Other | Admitting: Adult Health

## 2018-05-04 ENCOUNTER — Telehealth: Payer: Self-pay | Admitting: Adult Health

## 2018-05-04 ENCOUNTER — Encounter: Payer: Self-pay | Admitting: Adult Health

## 2018-05-04 VITALS — BP 124/77 | HR 72 | Resp 16

## 2018-05-04 VITALS — BP 151/90 | HR 77 | Temp 98.9°F | Resp 17 | Ht 68.0 in | Wt 150.6 lb

## 2018-05-04 DIAGNOSIS — Z800 Family history of malignant neoplasm of digestive organs: Secondary | ICD-10-CM | POA: Diagnosis not present

## 2018-05-04 DIAGNOSIS — I252 Old myocardial infarction: Secondary | ICD-10-CM | POA: Diagnosis not present

## 2018-05-04 DIAGNOSIS — C7951 Secondary malignant neoplasm of bone: Secondary | ICD-10-CM | POA: Diagnosis not present

## 2018-05-04 DIAGNOSIS — Z79810 Long term (current) use of selective estrogen receptor modulators (SERMs): Secondary | ICD-10-CM

## 2018-05-04 DIAGNOSIS — I10 Essential (primary) hypertension: Secondary | ICD-10-CM | POA: Diagnosis not present

## 2018-05-04 DIAGNOSIS — Z5112 Encounter for antineoplastic immunotherapy: Secondary | ICD-10-CM

## 2018-05-04 DIAGNOSIS — E039 Hypothyroidism, unspecified: Secondary | ICD-10-CM | POA: Insufficient documentation

## 2018-05-04 DIAGNOSIS — Z803 Family history of malignant neoplasm of breast: Secondary | ICD-10-CM | POA: Diagnosis not present

## 2018-05-04 DIAGNOSIS — C50412 Malignant neoplasm of upper-outer quadrant of left female breast: Secondary | ICD-10-CM

## 2018-05-04 DIAGNOSIS — R21 Rash and other nonspecific skin eruption: Secondary | ICD-10-CM | POA: Diagnosis not present

## 2018-05-04 DIAGNOSIS — Z170 Estrogen receptor positive status [ER+]: Secondary | ICD-10-CM | POA: Diagnosis not present

## 2018-05-04 DIAGNOSIS — Z9221 Personal history of antineoplastic chemotherapy: Secondary | ICD-10-CM | POA: Diagnosis not present

## 2018-05-04 DIAGNOSIS — Z9012 Acquired absence of left breast and nipple: Secondary | ICD-10-CM | POA: Insufficient documentation

## 2018-05-04 DIAGNOSIS — I2510 Atherosclerotic heart disease of native coronary artery without angina pectoris: Secondary | ICD-10-CM | POA: Insufficient documentation

## 2018-05-04 DIAGNOSIS — Z923 Personal history of irradiation: Secondary | ICD-10-CM | POA: Diagnosis not present

## 2018-05-04 DIAGNOSIS — R42 Dizziness and giddiness: Secondary | ICD-10-CM | POA: Insufficient documentation

## 2018-05-04 DIAGNOSIS — R7989 Other specified abnormal findings of blood chemistry: Secondary | ICD-10-CM

## 2018-05-04 DIAGNOSIS — R079 Chest pain, unspecified: Secondary | ICD-10-CM | POA: Insufficient documentation

## 2018-05-04 DIAGNOSIS — E785 Hyperlipidemia, unspecified: Secondary | ICD-10-CM

## 2018-05-04 DIAGNOSIS — Z95828 Presence of other vascular implants and grafts: Secondary | ICD-10-CM

## 2018-05-04 LAB — COMPREHENSIVE METABOLIC PANEL
ALT: 50 U/L — ABNORMAL HIGH (ref 0–44)
AST: 56 U/L — ABNORMAL HIGH (ref 15–41)
Albumin: 4 g/dL (ref 3.5–5.0)
Alkaline Phosphatase: 230 U/L — ABNORMAL HIGH (ref 38–126)
Anion gap: 8 (ref 5–15)
BUN: 9 mg/dL (ref 8–23)
CO2: 28 mmol/L (ref 22–32)
Calcium: 8.9 mg/dL (ref 8.9–10.3)
Chloride: 103 mmol/L (ref 98–111)
Creatinine, Ser: 0.64 mg/dL (ref 0.44–1.00)
GFR calc Af Amer: >60 mL/min (ref >60)
GFR calc non Af Amer: >60 mL/min (ref >60)
Glucose, Bld: 97 mg/dL (ref 70–99)
Potassium: 3.9 mmol/L (ref 3.5–5.1)
Sodium: 139 mmol/L (ref 135–145)
Total Bilirubin: 0.4 mg/dL (ref 0.3–1.2)
Total Protein: 7.5 g/dL (ref 6.5–8.1)

## 2018-05-04 LAB — CBC WITH DIFFERENTIAL/PLATELET
Basophils Absolute: 0 10*3/uL (ref 0.0–0.1)
Basophils Relative: 1 %
Eosinophils Absolute: 0.1 10*3/uL (ref 0.0–0.5)
Eosinophils Relative: 2 %
HCT: 39 % (ref 34.8–46.6)
Hemoglobin: 12.7 g/dL (ref 11.6–15.9)
Lymphocytes Relative: 39 %
Lymphs Abs: 2.2 10*3/uL (ref 0.9–3.3)
MCH: 30.1 pg (ref 25.1–34.0)
MCHC: 32.6 g/dL (ref 31.5–36.0)
MCV: 92.4 fL (ref 79.5–101.0)
Monocytes Absolute: 0.7 10*3/uL (ref 0.1–0.9)
Monocytes Relative: 11 %
Neutro Abs: 2.7 10*3/uL (ref 1.5–6.5)
Neutrophils Relative %: 47 %
Platelets: 216 10*3/uL (ref 145–400)
RBC: 4.22 MIL/uL (ref 3.70–5.45)
RDW: 13.6 % (ref 11.2–14.5)
WBC: 5.7 10*3/uL (ref 3.9–10.3)

## 2018-05-04 MED ORDER — TRIAMCINOLONE ACETONIDE 0.5 % EX OINT: 1.0000 "application " | TOPICAL_OINTMENT | Freq: Two times a day (BID) | CUTANEOUS | 0 refills | Status: DC

## 2018-05-04 MED ORDER — DENOSUMAB 120 MG/1.7ML ~~LOC~~ SOLN
120.0000 mg | Freq: Once | SUBCUTANEOUS | Status: AC
  Administered 2018-05-04: 120 mg via SUBCUTANEOUS

## 2018-05-04 MED ORDER — DIPHENHYDRAMINE HCL 25 MG PO CAPS
50.0000 mg | ORAL_CAPSULE | Freq: Once | ORAL | Status: AC
  Administered 2018-05-04: 50 mg via ORAL

## 2018-05-04 MED ORDER — SODIUM CHLORIDE 0.9% FLUSH
10.0000 mL | Freq: Once | INTRAVENOUS | Status: AC
  Administered 2018-05-04: 10 mL
  Filled 2018-05-04: qty 10

## 2018-05-04 MED ORDER — ACETAMINOPHEN 325 MG PO TABS
650.0000 mg | ORAL_TABLET | Freq: Once | ORAL | Status: AC
  Administered 2018-05-04: 650 mg via ORAL

## 2018-05-04 MED ORDER — SODIUM CHLORIDE 0.9 % IV SOLN
260.0000 mg | Freq: Once | INTRAVENOUS | Status: AC
  Administered 2018-05-04: 260 mg via INTRAVENOUS
  Filled 2018-05-04: qty 5

## 2018-05-04 MED ORDER — ACETAMINOPHEN 325 MG PO TABS
ORAL_TABLET | ORAL | Status: AC
Start: 2018-05-04 — End: ?
  Filled 2018-05-04: qty 2

## 2018-05-04 MED ORDER — HEPARIN SOD (PORK) LOCK FLUSH 100 UNIT/ML IV SOLN
500.0000 [IU] | Freq: Once | INTRAVENOUS | Status: AC | PRN
  Administered 2018-05-04: 500 [IU]
  Filled 2018-05-04: qty 5

## 2018-05-04 MED ORDER — DENOSUMAB 120 MG/1.7ML ~~LOC~~ SOLN
SUBCUTANEOUS | Status: AC
  Filled 2018-05-04: qty 1.7

## 2018-05-04 MED ORDER — SODIUM CHLORIDE 0.9% FLUSH
10.0000 mL | INTRAVENOUS | Status: DC | PRN
  Administered 2018-05-04: 10 mL
  Filled 2018-05-04: qty 10

## 2018-05-04 MED ORDER — SODIUM CHLORIDE 0.9 % IV SOLN
Freq: Once | INTRAVENOUS | Status: AC
  Administered 2018-05-04: 10:00:00 via INTRAVENOUS

## 2018-05-04 MED ORDER — DIPHENHYDRAMINE HCL 25 MG PO CAPS
ORAL_CAPSULE | ORAL | Status: AC
  Filled 2018-05-04: qty 2

## 2018-05-04 NOTE — Telephone Encounter (Signed)
Gave avs and calendar ° °

## 2018-05-04 NOTE — Assessment & Plan Note (Addendum)
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 Was on Arimidex since 08/22/2012 to 03/30/16  Subcutaneous nodule excisionleft chest: Infiltrating carcinoma breast primary, ER positive, PR negative, HER-2 positive  009/03/2017:Left chest wall nodule excision recurrent breast cancer  CT CAP: 08/11/17: Open wound. Diffuse bone mets. Bone Scan 08/11/17: Progression. Left femur mets are new. Existing lesions show inc activity Echo 01/22/18: EF 55-60%  Priortreatment: Herceptinand Perjeta(added 09/22/2017)Q 4 weeks, Faslodex, Zometa, started 04/28/2016   CT CAP:02/05/2018: New 5 mm right lower lobe pulmonary nodule suspicious for new pulmonary metastatic disease. Significant progression of bone metastatic disease. Bone scan: 02/05/2018: Progressive bone metastatic disease with new uptake at L2, posterior right eighth rib, proximal right humerus --------------------------------------------------------------------------- Current treatment:   Kadcyla, due today, she is tolerating this well.  She is due for repeat echo which I ordered today Xgeva, due today, she will proceed with this.  She is tolerating it well.    She is due for repeat scans and I ordered these to be done prior to her next appt with Dr. Lindi Adie, along with her echo.    I sent in some triamcinolone cream for her arm.  I am unsure what this rash is, but it appears to be mild and resolving.    She will return on 05/25/18 for labs, f/u with Dr. Lindi Adie, and Steward Drone.

## 2018-05-04 NOTE — Progress Notes (Signed)
[facility] Follow up:    [name] Housekeeper, MD [place] [address]   DIAGNOSIS: Cancer Staging Breast cancer of upper-outer quadrant of left female breast [facility]) Staging form: Breast, AJCC 7th Edition - Clinical: Stage IIB (T2, N1, cM0) - Unsigned - Pathologic: No stage assigned - Unsigned   SUMMARY OF ONCOLOGIC HISTORY:   Breast cancer of upper-outer quadrant of left female breast ([place])   11/14/2011 Surgery    Bilateral mastectomy, prophylactic on the right, left breast IDC 3/18 lymph nodes positive with extracapsular extension ER 89%, PR 81%, HER-2 negative, Ki-67 79% T2 N1 A. stage IIB      12/13/2011 - 06/28/2012 Chemotherapy    4 cycles of FEC followed by 4 cycles of Taxotere      07/17/2012 - 08/22/2012 Radiation Therapy    Adjuvant radiation therapy      08/22/2012 - 03/16/2016 Anti-estrogen oral therapy    Arimidex 1 mg daily      03/16/2016 Relapse/Recurrence    Subcutaneous nodule excision left chest: Infiltrating carcinoma breast primary, ER positive, PR negative      03/29/2016 Imaging    CT CAP and bone scan: Lytic lesions T8 vertebral, T1 posterior element, subcutaneous nodule left lateral chest wall, nonspecific lung nodules; Bone scan: Mets to kull, left humerus, left eighth rib, T7/T8, sternum, left acetabulum      04/28/2016 - 06/17/2017 Chemotherapy    Herceptin, lapatinib, Faslodex, Zometa every 4 weeks, lapatinib discontinued in September 2018 due to elevation of LFTs      06/22/2017 Relapse/Recurrence    Surgical excision:Soft tissue mass left lateral chest wall primary breast cancer, soft tissue mass left medial chest wall breast cancer, tumor is within the dermis extending to the subcutaneous adipose tissue and involves portions of skeletal muscle      08/2017 - 02/16/2018 Chemotherapy    Faslodex with Herceptin and Perjeta along with Zometa every 4 weeks       03/02/2018 -  Chemotherapy    Kadcyla       04/12/2018 -  Chemotherapy    The patient had ado-trastuzumab emtansine (KADCYLA) 260 mg in sodium chloride 0.9 % 250 mL chemo infusion, 3.8 mg/kg = 240 mg, Intravenous, Once, 2 of 5 cycles Administration: 260 mg (04/13/2018)  for chemotherapy treatment.        CURRENT THERAPY: Kadcyla  INTERVAL HISTORY: [name] Riley 67 y.o. female returns for evaluation prior to receiving Kadycla.  She is doing well.  She denies chest pain, shortness of breath, swelling.  She notes a new rash on her left arm that has been present for the past couple of weeks.  She is applying some hydrocortisone cream to her left arm.  She says this helps.  She also has h/o shingles on her neck and is taking hydroxyzine for this and is tolerating it well.     Patient Active Problem List   Diagnosis Date Noted  . Port-A-Cath in place 03/23/2018  . Elevated LFTs   . Metastatic breast cancer ([place]) 06/29/2017  . Nodule of skin of breast 03/06/2017  . Abscess, abdomen 07/06/2016  . Metastasis to bone ([place]) 06/01/2016  . Pneumothorax on left 04/25/2016  . Chest pain 10/23/2014  . Radiation-induced dermatitis 07/27/2012  . Hypertension   . Breast cancer of upper-outer quadrant of left female breast ([place]) 09/01/2011  . Hypothyroidism 06/12/2009  . HYPERLIPIDEMIA 06/12/2009  . Essential hypertension 06/12/2009  . Coronary atherosclerosis 06/12/2009  . DIZZINESS 06/12/2009  .  PALPITATIONS 06/12/2009  . Heart attack ([place]) 09/16/2000    is allergic to contrast media [iodinated diagnostic agents]; food; pravastatin; and zosyn [piperacillin sod-tazobactam so].  MEDICAL HISTORY: Past Medical History:  Diagnosis Date  . Allergy   . Breast cancer ([place]) 08/25/2011   L , invasive ductal carcinoma, ER/PR +,HER2 -  . Cancer [facility])    left breast cancer  . Coronary artery disease 2001  . Heart attack [facility]) 09/2000   Sep 25, 2000  --no intervention  . History of chemotherapy comp. 08/22/2012   4 cycles of FEC and $ cycles  of Taxotere  . Hyperlipidemia   . Hypertension   . Hypothyroidism   . PONV (postoperative nausea and vomiting)    gets sick from anesthesia  . Status post radiation therapy 07/09/12 - 08/22/2012   Left Breast, 60.4 gray    SURGICAL HISTORY: Past Surgical History:  Procedure Laterality Date  . ABDOMINAL HYSTERECTOMY  1998   TAH, oophorectomy  . APPENDECTOMY  1970  . BREAST SURGERY  1998   removal of benign lump in rt breast  . BREAST SURGERY  11/14/11   right simple mastectomy, left mrm  . INCISION AND DRAINAGE OF WOUND Left 07/01/2017   Procedure: IRRIGATION AND DEBRIDEMENT CHEST WALL ABSCESS;  Surgeon: Donnie Mesa, MD;  Location: [facility];  Service: General;  Laterality: Left;  Marland Kitchen MASS EXCISION Left 06/22/2017   Procedure: EXCISION OF CHEST WALL MASSES;  Surgeon: Donnie Mesa, MD;  Location: [facility];  Service: General;  Laterality: Left;  Marland Kitchen MASTECTOMY Bilateral    for left breast cancer  . OVARIAN CYST SURGERY Right 1970  . PORT-A-CATH REMOVAL  08/30/2012   Procedure: REMOVAL PORT-A-CATH;  Surgeon: Imogene Burn. [name], MD;  Location: [facility];  Service: General;  Laterality: Right;  port removal  . PORTACATH PLACEMENT  11/14/2011   Procedure: INSERTION PORT-A-CATH;  Surgeon: Imogene Burn. [name], MD;  Location: [facility];  Service: General;  Laterality: Right;  . PORTACATH PLACEMENT N/A 04/25/2016   Procedure: INSERTION PORT-A-CATH LEFT CHEST;  Surgeon: Donnie Mesa, MD;  Location: [facility];  Service: General;  Laterality: N/A;  . skin tags  05/09/1997   left axillary left neck skin tags  . TONSILLECTOMY  1968    SOCIAL HISTORY: Social History   Socioeconomic History  . Marital status: Married    Spouse name: Not on file  . Number of children: 3  . Years of education: Not on file  . Highest education level: Not on file  Occupational History  . Occupation: Works at [employer]  . Financial resource strain: Not on file  . Food  insecurity:    Worry: Not on file    Inability: Not on file  . Transportation needs:    Medical: Not on file    Non-medical: Not on file  Tobacco Use  . Smoking status: Never Smoker  . Smokeless tobacco: Never Used  Substance and Sexual Activity  . Alcohol use: No  . Drug use: No  . Sexual activity: Yes    Birth control/protection: Surgical    Comment: menarche 25, Parity age 62, G77, P3, 1 miscarriage,  HRT x 5-10 yrs, Mild Hot Flashes  Lifestyle  . Physical activity:    Days per week: Not on file    Minutes per session: Not on file  . Stress: Not on file  Relationships  . Social connections:    Talks on phone: Not on file  Gets together: Not on file    Attends religious service: Not on file    Active member of club or organization: Not on file    Attends meetings of clubs or organizations: Not on file    Relationship status: Not on file  . Intimate partner violence:    Fear of current or ex partner: Not on file    Emotionally abused: Not on file    Physically abused: Not on file    Forced sexual activity: Not on file  Other Topics Concern  . Not on file  Social History Narrative   Lives at home.    FAMILY HISTORY: Family History  Problem Relation Age of Onset  . Hypertension Maternal Grandmother   . Diabetes Maternal Grandmother   . Cancer Father 30       lung cancer and Prostate Cancer  . Hypertension Mother   . Cancer Paternal Aunt        ovarian  . Cancer Cousin        breast, paternal cousin  . Cancer Paternal Uncle        stomach  . Cancer Paternal Grandfather        Esophagus  . Colon cancer Neg Hx     Review of Systems  Constitutional: Negative for appetite change, chills, fatigue, fever and unexpected weight change.  HENT:   Negative for hearing loss, lump/mass, sore throat and trouble swallowing.   Eyes: Negative for eye problems and icterus.  Respiratory: Negative for chest tightness, cough and shortness of breath.   Cardiovascular: Negative  for chest pain, leg swelling and palpitations.  Gastrointestinal: Negative for abdominal distention, abdominal pain, constipation, diarrhea, nausea and vomiting.  Endocrine: Negative for hot flashes.  Skin: Negative for itching and rash.  Neurological: Negative for dizziness, extremity weakness, headaches and numbness.  Hematological: Negative for adenopathy.  Psychiatric/Behavioral: Negative for depression. The patient is not nervous/anxious.       PHYSICAL EXAMINATION  ECOG PERFORMANCE STATUS: 1 - Symptomatic but completely ambulatory  Vitals:   05/04/18 0848  BP: (!) 151/90  Pulse: 77  Resp: 17  Temp: 98.9 F (37.2 C)  SpO2: 99%    Physical Exam  Constitutional: She is oriented to person, place, and time. She appears well-developed and well-nourished.  HENT:  Head: Normocephalic and atraumatic.  Mouth/Throat: Oropharynx is clear and moist. No oropharyngeal exudate.  Eyes: Pupils are equal, round, and reactive to light. No scleral icterus.  Neck: Neck supple.  Cardiovascular: Normal rate, regular rhythm, normal heart sounds and intact distal pulses.  Pulmonary/Chest: Effort normal and breath sounds normal. No respiratory distress.  Abdominal: Soft. Bowel sounds are normal.  Musculoskeletal: She exhibits no edema.  Lymphadenopathy:    She has no cervical adenopathy.  Neurological: She is alert and oriented to person, place, and time.  Skin: Skin is warm and dry. Capillary refill takes less than 2 seconds. Rash (several small circular what appears to be previous papular areas that have now scabbed over, no erythema, these are only located on her left arm) noted.  Psychiatric: She has a normal mood and affect.    LABORATORY DATA:  CBC    Component Value Date/Time   WBC 5.7 05/04/2018 0818   RBC 4.22 05/04/2018 0818   HGB 12.7 05/04/2018 0818   HGB 12.4 04/13/2018 0840   HGB 11.5 (L) 09/22/2017 0958   HCT 39.0 05/04/2018 0818   HCT 34.9 09/22/2017 0958   PLT 216  05/04/2018 0818   PLT  266 04/13/2018 0840   PLT 295 09/22/2017 0958   MCV 92.4 05/04/2018 0818   MCV 94.3 09/22/2017 0958   MCH 30.1 05/04/2018 0818   MCHC 32.6 05/04/2018 0818   RDW 13.6 05/04/2018 0818   RDW 15.3 (H) 09/22/2017 0958   LYMPHSABS 2.2 05/04/2018 0818   LYMPHSABS 2.2 09/22/2017 0958   MONOABS 0.7 05/04/2018 0818   MONOABS 0.8 09/22/2017 0958   EOSABS 0.1 05/04/2018 0818   EOSABS 0.1 09/22/2017 0958   BASOSABS 0.0 05/04/2018 0818   BASOSABS 0.0 09/22/2017 0958    CMP     Component Value Date/Time   NA 139 05/04/2018 0818   NA 140 09/22/2017 0958   K 3.9 05/04/2018 0818   K 3.7 09/22/2017 0958   CL 103 05/04/2018 0818   CL 105 08/10/2012 0908   CO2 28 05/04/2018 0818   CO2 26 09/22/2017 0958   GLUCOSE 97 05/04/2018 0818   GLUCOSE 80 09/22/2017 0958   GLUCOSE 81 08/10/2012 0908   BUN 9 05/04/2018 0818   BUN 12.8 09/22/2017 0958   CREATININE 0.64 05/04/2018 0818   CREATININE 0.71 03/23/2018 0733   CREATININE 0.6 09/22/2017 0958   CALCIUM 8.9 05/04/2018 0818   CALCIUM 9.1 09/22/2017 0958   PROT 7.5 05/04/2018 0818   PROT 7.0 09/22/2017 0958   ALBUMIN 4.0 05/04/2018 0818   ALBUMIN 4.0 09/22/2017 0958   AST 56 (H) 05/04/2018 0818   AST 72 (H) 03/23/2018 0733   AST 37 (H) 09/22/2017 0958   ALT 50 (H) 05/04/2018 0818   ALT 70 (H) 03/23/2018 0733   ALT 31 09/22/2017 0958   ALKPHOS 230 (H) 05/04/2018 0818   ALKPHOS 140 09/22/2017 0958   BILITOT 0.4 05/04/2018 0818   BILITOT 0.4 03/23/2018 0733   BILITOT 0.41 09/22/2017 0958   GFRNONAA >60 05/04/2018 0818   GFRNONAA >60 03/23/2018 0733   GFRAA >60 05/04/2018 0818   GFRAA >60 03/23/2018 0733     ASSESSMENT and PLAN:   Breast cancer of upper-outer quadrant of left female breast ([place]) Left breast invasive ductal carcinomaT2, N1, M0 stage IIB 3 of 18 lymph nodes positive with extracapsular extension ER 89% PR 81% HER-2 negative Ki-67 79% status post 4 cycles of FEC and 4 cycles of Taxotere and adjuvant  radiation. Was on Arimidex since 08/22/2012 to 03/30/16  Subcutaneous nodule excisionleft chest: Infiltrating carcinoma breast primary, ER positive, PR negative, HER-2 positive  009/03/2017:Left chest wall nodule excision recurrent breast cancer  CT CAP: 08/11/17: Open wound. Diffuse bone mets. Bone Scan 08/11/17: Progression. Left femur mets are new. Existing lesions show inc activity Echo 01/22/18: EF 55-60%  Priortreatment: Herceptinand Perjeta(added 09/22/2017)Q 4 weeks, Faslodex, Zometa, started 04/28/2016   CT CAP:02/05/2018: New 5 mm right lower lobe pulmonary nodule suspicious for new pulmonary metastatic disease. Significant progression of bone metastatic disease. Bone scan: 02/05/2018: Progressive bone metastatic disease with new uptake at L2, posterior right eighth rib, proximal right humerus --------------------------------------------------------------------------- Current treatment:   Kadcyla, due today, she is tolerating this well.  She is due for repeat echo which I ordered today Xgeva, due today, she will proceed with this.  She is tolerating it well.    She is due for repeat scans and I ordered these to be done prior to her next appt with Dr. Lindi Adie, along with her echo.    I sent in some triamcinolone cream for her arm.  I am unsure what this rash is, but it appears to be mild and resolving.  She will return on 05/25/18 for labs, f/u with Dr. Lindi Adie, and Steward Drone.       Orders Placed This Encounter  Procedures  . CT Abdomen Pelvis W Contrast    Standing Status:   Future    Standing Expiration Date:   05/04/2019    Order Specific Question:   If indicated for the ordered procedure, I authorize the administration of contrast media per Radiology protocol    Answer:   Yes    Order Specific Question:   Preferred imaging location?    Answer:   [facility]    Order Specific Question:   Is Oral Contrast requested for this exam?    Answer:   Yes, Per  Radiology protocol    Order Specific Question:   Radiology Contrast Protocol - do NOT remove file path    Answer:   \\[email or web address]  . CT Chest W Contrast    Standing Status:   Future    Standing Expiration Date:   05/04/2019    Order Specific Question:   If indicated for the ordered procedure, I authorize the administration of contrast media per Radiology protocol    Answer:   Yes    Order Specific Question:   Preferred imaging location?    Answer:   [facility]    Order Specific Question:   Radiology Contrast Protocol - do NOT remove file path    Answer:   \\[email or web address]  . NM Bone Scan Whole Body    Standing Status:   Future    Standing Expiration Date:   05/04/2019    Order Specific Question:   If indicated for the ordered procedure, I authorize the administration of a radiopharmaceutical per Radiology protocol    Answer:   Yes    Order Specific Question:   Preferred imaging location?    Answer:   [facility]    Order Specific Question:   Radiology Contrast Protocol - do NOT remove file path    Answer:   \\[email or web address]  . ECHOCARDIOGRAM COMPLETE    Standing Status:   Future    Standing Expiration Date:   08/05/2019    Order Specific Question:   Where should this test be performed    Answer:   [facility]    Order Specific Question:   Perflutren DEFINITY (image enhancing agent) should be administered unless hypersensitivity or allergy exist    Answer:   Administer Perflutren    Order Specific Question:   Expected Date:    Answer:   1 week    All questions were answered. The patient knows to call the clinic with any problems, questions or concerns. We can certainly see the patient much sooner if necessary.  A total of (30) minutes of face-to-face time was spent with this patient with greater than 50% of that time in counseling and care-coordination.  This note was  electronically signed. Scot Dock, NP 05/04/2018

## 2018-05-04 NOTE — Patient Instructions (Signed)
[facility] Discharge Instructions for Patients Receiving Chemotherapy  Today you received the following chemotherapy agents Kadcyla  To help prevent nausea and vomiting after your treatment, we encourage you to take your nausea medication as directed   If you develop nausea and vomiting that is not controlled by your nausea medication, call the clinic.   BELOW ARE SYMPTOMS THAT SHOULD BE REPORTED IMMEDIATELY:  *FEVER GREATER THAN 100.5 F  *CHILLS WITH OR WITHOUT FEVER  NAUSEA AND VOMITING THAT IS NOT CONTROLLED WITH YOUR NAUSEA MEDICATION  *UNUSUAL SHORTNESS OF BREATH  *UNUSUAL BRUISING OR BLEEDING  TENDERNESS IN MOUTH AND THROAT WITH OR WITHOUT PRESENCE OF ULCERS  *URINARY PROBLEMS  *BOWEL PROBLEMS  UNUSUAL RASH Items with * indicate a potential emergency and should be followed up as soon as possible.  Feel free to call the clinic should you have any questions or concerns. The clinic phone number is [phone].  Please show the CHEMO ALERT CARD at check-in to the Emergency Department and triage nurse.   

## 2018-05-07 ENCOUNTER — Telehealth: Payer: Self-pay

## 2018-05-07 NOTE — Telephone Encounter (Signed)
Pt called to let Mendel Ryder and Dr.Gudena know that she will be leaving for vacation soon and won't be back in time to have her scans done before her next scheduled MD and chemo appt 05/25/18. Pt would like to get her scans/ echo scheduled sometime mid august if its ok. She will be coming back for an appt lab/fl/md/inf on  06/15/18.   Gave pt number for central scheduling to make appt for her scans (CT/PET) and told pt that Lindsey,APP and Dr.Gudena will be notified of pt request. She would prefer to have her echo scheduled at [facility] in [place] since its closer to her home. Will call and get her scheduled.  Pt has allergy to IVP dye. Will send prednisone dose to preferred pharmacy and gave pt instructions on how to take it. Pt confirmed that she has benardyl at home to take with prednisone, 1 hr before scheduled scan. Pt requested to also have her port accessed at the cancer center prior to going for her scans. Will send a message to scheduling for flush appt. Pt verbalized understanding and thankful for the call and time.

## 2018-05-11 ENCOUNTER — Emergency Department (HOSPITAL_COMMUNITY): Payer: Medicare Other

## 2018-05-11 ENCOUNTER — Other Ambulatory Visit: Payer: Self-pay

## 2018-05-11 ENCOUNTER — Encounter (HOSPITAL_COMMUNITY): Payer: Self-pay | Admitting: Emergency Medicine

## 2018-05-11 ENCOUNTER — Emergency Department (HOSPITAL_COMMUNITY)
Admission: EM | Admit: 2018-05-11 | Discharge: 2018-05-11 | Disposition: A | Discharge status: Home / Self Care | Payer: Medicare Other | Attending: Emergency Medicine | Admitting: Emergency Medicine

## 2018-05-11 DIAGNOSIS — I252 Old myocardial infarction: Secondary | ICD-10-CM | POA: Insufficient documentation

## 2018-05-11 DIAGNOSIS — I2510 Atherosclerotic heart disease of native coronary artery without angina pectoris: Secondary | ICD-10-CM | POA: Insufficient documentation

## 2018-05-11 DIAGNOSIS — I10 Essential (primary) hypertension: Secondary | ICD-10-CM | POA: Diagnosis not present

## 2018-05-11 DIAGNOSIS — R569 Unspecified convulsions: Secondary | ICD-10-CM | POA: Insufficient documentation

## 2018-05-11 DIAGNOSIS — C7931 Secondary malignant neoplasm of brain: Secondary | ICD-10-CM | POA: Diagnosis not present

## 2018-05-11 DIAGNOSIS — E039 Hypothyroidism, unspecified: Secondary | ICD-10-CM | POA: Insufficient documentation

## 2018-05-11 DIAGNOSIS — R202 Paresthesia of skin: Secondary | ICD-10-CM | POA: Diagnosis present

## 2018-05-11 LAB — BASIC METABOLIC PANEL
Anion gap: 11 (ref 5–15)
BUN: 10 mg/dL (ref 8–23)
CO2: 27 mmol/L (ref 22–32)
Calcium: 8 mg/dL — ABNORMAL LOW (ref 8.9–10.3)
Chloride: 101 mmol/L (ref 98–111)
Creatinine, Ser: 0.58 mg/dL (ref 0.44–1.00)
GFR calc Af Amer: >60 mL/min (ref >60)
GFR calc non Af Amer: >60 mL/min (ref >60)
Glucose, Bld: 88 mg/dL (ref 70–99)
Potassium: 2.8 mmol/L — ABNORMAL LOW (ref 3.5–5.1)
Sodium: 139 mmol/L (ref 135–145)

## 2018-05-11 LAB — CBC WITH DIFFERENTIAL/PLATELET
Basophils Absolute: 0 10*3/uL (ref 0.0–0.1)
Basophils Relative: 0 %
Eosinophils Absolute: 0.1 10*3/uL (ref 0.0–0.7)
Eosinophils Relative: 1 %
HCT: 37.9 % (ref 36.0–46.0)
Hemoglobin: 12.5 g/dL (ref 12.0–15.0)
Lymphocytes Relative: 23 %
Lymphs Abs: 1.7 10*3/uL (ref 0.7–4.0)
MCH: 29.9 pg (ref 26.0–34.0)
MCHC: 33 g/dL (ref 30.0–36.0)
MCV: 90.7 fL (ref 78.0–100.0)
Monocytes Absolute: 0.7 10*3/uL (ref 0.1–1.0)
Monocytes Relative: 10 %
Neutro Abs: 4.7 10*3/uL (ref 1.7–7.7)
Neutrophils Relative %: 66 %
Platelets: 85 10*3/uL — ABNORMAL LOW (ref 150–400)
RBC: 4.18 MIL/uL (ref 3.87–5.11)
RDW: 13.3 % (ref 11.5–15.5)
WBC: 7.1 10*3/uL (ref 4.0–10.5)

## 2018-05-11 LAB — ALBUMIN: Albumin: 4.5 g/dL (ref 3.5–5.0)

## 2018-05-11 LAB — TROPONIN I: Troponin I: <0.03 ng/mL

## 2018-05-11 LAB — TSH: TSH: 1.974 u[IU]/mL (ref 0.350–4.500)

## 2018-05-11 MED ORDER — DEXAMETHASONE 4 MG PO TABS
4.0000 mg | ORAL_TABLET | Freq: Three times a day (TID) | ORAL | 0 refills | Status: AC
Start: 2018-05-12 — End: 2018-05-26

## 2018-05-11 MED ORDER — LACTATED RINGERS IV BOLUS
1000.0000 mL | Freq: Once | INTRAVENOUS | Status: AC
  Administered 2018-05-11: 1000 mL via INTRAVENOUS

## 2018-05-11 MED ORDER — LEVETIRACETAM 500 MG PO TABS: 500.0000 mg | ORAL_TABLET | Freq: Two times a day (BID) | ORAL | 0 refills | Status: DC

## 2018-05-11 MED ORDER — POTASSIUM CHLORIDE 10 MEQ/100ML IV SOLN
INTRAVENOUS | Status: AC
  Filled 2018-05-11: qty 100

## 2018-05-11 MED ORDER — LEVETIRACETAM IN NACL 1000 MG/100ML IV SOLN
1000.0000 mg | Freq: Once | INTRAVENOUS | Status: AC
  Administered 2018-05-11: 1000 mg via INTRAVENOUS
  Filled 2018-05-11: qty 100

## 2018-05-11 MED ORDER — GADOBENATE DIMEGLUMINE 529 MG/ML IV SOLN
14.0000 mL | Freq: Once | INTRAVENOUS | Status: AC | PRN
  Administered 2018-05-11: 14 mL via INTRAVENOUS

## 2018-05-11 MED ORDER — LORAZEPAM 1 MG PO TABS
1.0000 mg | ORAL_TABLET | Freq: Once | ORAL | Status: AC
  Administered 2018-05-11: 1 mg via ORAL
  Filled 2018-05-11: qty 1

## 2018-05-11 MED ORDER — DEXAMETHASONE SODIUM PHOSPHATE 4 MG/ML IJ SOLN
4.0000 mg | Freq: Once | INTRAMUSCULAR | Status: AC
  Administered 2018-05-11: 4 mg via INTRAVENOUS
  Filled 2018-05-11: qty 1

## 2018-05-11 MED ORDER — POTASSIUM CHLORIDE CRYS ER 20 MEQ PO TBCR
40.0000 meq | EXTENDED_RELEASE_TABLET | Freq: Once | ORAL | Status: AC
  Administered 2018-05-11: 40 meq via ORAL
  Filled 2018-05-11: qty 2

## 2018-05-11 MED ORDER — CALCIUM CARBONATE 1250 (500 CA) MG PO TABS
1.0000 | ORAL_TABLET | Freq: Once | ORAL | Status: AC
  Administered 2018-05-11: 500 mg via ORAL
  Filled 2018-05-11: qty 1

## 2018-05-11 NOTE — ED Provider Notes (Signed)
Emergency Department Provider Note   I have reviewed the triage vital signs and the nursing notes.   HISTORY  Chief Complaint Numbness   HPI [name] Riley is a 67 y.o. female without significant past medical history that presents to the emergency department today with numbness in her left arm.  Patient states that she usually does not mow the lawn but had to mow the lawn to help her mother out today.  Been going for a little while when she had acute onset of palpitations but only in her left arm.  She subsequently started having some tingling in her left hand and inability to move at that look like it was cramping.  She states it was not that it was weak she just could not control it because they were stuck in a certain position.  Then the tingling feeling went up to her left shoulder for a short period time she has some nausea with it. Had some shaking of it as well.   She has never had any symptoms like this before but however has had a history of heart attack where she had tingling and numbness in her bilateral shoulders.  She states that he never had a stroke.  No recent trauma.  She does have breast cancer that is metastatic that does seem to be worsening but no known lesions in her head. After part of the work-up was done I went and talked the patient and they remembered that this did happen to her approximately 1 week prior but did not last nearly as long so did not think to report it.  They stated that this shaking of her hand was more like a rhythmic shaking and lasted approximately 5 minutes today. No other associated or modifying symptoms.    Past Medical History:  Diagnosis Date  . Allergy   . Breast cancer ([place]) 08/25/2011   L , invasive ductal carcinoma, ER/PR +,HER2 -  . Cancer [facility])    left breast cancer  . Coronary artery disease 2001  . Heart attack [facility]) 09/2000   Sep 25, 2000  --no intervention  . History of chemotherapy comp. 08/22/2012   4 cycles of FEC and $  cycles of Taxotere  . Hyperlipidemia   . Hypertension   . Hypothyroidism   . PONV (postoperative nausea and vomiting)    gets sick from anesthesia  . Status post radiation therapy 07/09/12 - 08/22/2012   Left Breast, 60.4 gray    Patient Active Problem List   Diagnosis Date Noted  . Port-A-Cath in place 03/23/2018  . Elevated LFTs   . Metastatic breast cancer ([place]) 06/29/2017  . Nodule of skin of breast 03/06/2017  . Abscess, abdomen 07/06/2016  . 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 Pneumothorax on left 04/25/2016  . Chest pain 10/23/2014  . Radiation-induced dermatitis 07/27/2012  . Hypertension   . Breast cancer of upper-outer quadrant of left female breast ([place]) 09/01/2011  . Hypothyroidism 06/12/2009  . HYPERLIPIDEMIA 06/12/2009  . Essential hypertension 06/12/2009  . Coronary atherosclerosis 06/12/2009  . DIZZINESS 06/12/2009  . PALPITATIONS 06/12/2009  . Heart attack ([place]) 09/16/2000    Past Surgical History:  Procedure Laterality Date  . ABDOMINAL HYSTERECTOMY  1998   TAH, oophorectomy  . APPENDECTOMY  1970  . BREAST SURGERY  1998   removal of benign lump in rt breast  . BREAST SURGERY  11/14/11   right simple mastectomy, left mrm  . INCISION AND DRAINAGE OF WOUND Left 07/01/2017  Procedure: IRRIGATION AND DEBRIDEMENT CHEST WALL ABSCESS;  Surgeon: Donnie Mesa, MD;  Location: [facility];  Service: General;  Laterality: Left;  Marland Kitchen MASS EXCISION Left 06/22/2017   Procedure: EXCISION OF CHEST WALL MASSES;  Surgeon: Donnie Mesa, MD;  Location: [place];  Service: General;  Laterality: Left;  Marland Kitchen MASTECTOMY Bilateral    for left breast cancer  . OVARIAN CYST SURGERY Right 1970  . PORT-A-CATH REMOVAL  08/30/2012   Procedure: REMOVAL PORT-A-CATH;  Surgeon: Imogene Burn. [name], MD;  Location: [place];  Service: General;  Laterality: Right;  port removal  . PORTACATH PLACEMENT  11/14/2011   Procedure: INSERTION PORT-A-CATH;  Surgeon: Imogene Burn. [name], MD;   Location: [place];  Service: General;  Laterality: Right;  . PORTACATH PLACEMENT N/A 04/25/2016   Procedure: INSERTION PORT-A-CATH LEFT CHEST;  Surgeon: Donnie Mesa, MD;  Location: [place];  Service: General;  Laterality: N/A;  . skin tags  05/09/1997   left axillary left neck skin tags  . TONSILLECTOMY  1968    Current Outpatient Rx  . Order #: [id number] Class: Historical Med  . Order #: [id number] Class: Historical Med  . Order #: [id number] Class: Historical Med  . Order #: [id number] Class: No Print  . Order #: [id number] Class: Historical Med  . Order #: [id number] Class: Historical Med  . Order #: [id number] Class: Normal  . Order #: [id number] Class: Normal  . Order #: [id number] Class: Historical Med  . Order #: [id number] Class: Normal  . Order #: [id number] Class: Normal  . Order #: [id number] Class: Print  . Order #: [id number] Class: Print    Allergies Cantaloupe extract allergy skin test; Contrast media [iodinated diagnostic agents]; Pravastatin; and Zosyn [piperacillin sod-tazobactam so]  Family History  Problem Relation Age of Onset  . Hypertension Maternal Grandmother   . Diabetes Maternal Grandmother   . Cancer Father 60       lung cancer and Prostate Cancer  . Hypertension Mother   . Cancer Paternal Aunt        ovarian  . Cancer Cousin        breast, paternal cousin  . Cancer Paternal Uncle        stomach  . Cancer Paternal Grandfather        Esophagus  . Colon cancer Neg Hx     Social History Social History   Tobacco Use  . Smoking status: Never Smoker  . Smokeless tobacco: Never Used  Substance Use Topics  . Alcohol use: No  . Drug use: No    Review of Systems  All other systems negative except as documented in the HPI. All pertinent positives and negatives as reviewed in the HPI. ____________________________________________   PHYSICAL EXAM:  VITAL SIGNS: ED Triage Vitals  Enc Vitals Group     BP 05/11/18 1322 (!) 154/86     Pulse Rate  05/11/18 1322 89     Resp 05/11/18 1322 17     Temp 05/11/18 1322 98.3 F (36.8 C)     Temp Source 05/11/18 1322 Oral     SpO2 05/11/18 1322 98 %     Weight 05/11/18 1328 150 lb (68 kg)     Height 05/11/18 1328 5' 8"  (1.727 m)    Constitutional: Alert and oriented. Well appearing and in no acute distress. Eyes: Conjunctivae are normal. PERRL. EOMI. Head: Atraumatic. Nose: No congestion/rhinnorhea. Mouth/Throat: Mucous membranes are moist.  Oropharynx non-erythematous. Neck: No stridor.  No meningeal signs.   Cardiovascular: Normal rate, regular rhythm. Good peripheral circulation. Grossly normal  heart sounds.   Respiratory: Normal respiratory effort.  No retractions. Lungs CTAB. Gastrointestinal: Soft and nontender. No distention.  Musculoskeletal: No lower extremity tenderness nor edema. No gross deformities of extremities. Neurologic:  Normal speech and language. No gross focal neurologic deficits are appreciated. No altered mental status, able to give full seemingly accurate history.  Face is symmetric, EOM's intact, pupils equal and reactive, vision intact, tongue and uvula midline without deviation. Upper and Lower extremity motor 5/5, intact pain perception in distal extremities, 2+ reflexes in biceps, patella and achilles tendons. Able to perform finger to nose normal with both hands. Walks without assistance or evident ataxia.  Skin:  Skin is warm, dry and intact. No rash noted.   ____________________________________________   LABS (all labs ordered are listed, but only abnormal results are displayed)  Labs Reviewed  CBC WITH DIFFERENTIAL/PLATELET - Abnormal; Notable for the following components:      Result Value   Platelets 85 (*)    All other components within normal limits  BASIC METABOLIC PANEL - Abnormal; Notable for the following components:   Potassium 2.8 (*)    Calcium 8.0 (*)    All other components within normal limits  TROPONIN I  ALBUMIN  TSH    ____________________________________________  EKG   EKG Interpretation  Date/Time:  Friday May 11 2018 13:19:30 EDT Ventricular Rate:  85 PR Interval:  122 QRS Duration: 88 QT Interval:  412 QTC Calculation: 490 R Axis:   -30 Text Interpretation:  Normal sinus rhythm Left axis deviation Nonspecific ST abnormality Prolonged QT Abnormal ECG ST depression in V5-6 not present previously.  Confirmed by Merrily Pew [phone]) on 05/11/2018 4:20:10 PM       ____________________________________________  RADIOLOGY  Dg Chest 2 View  Result Date: 05/11/2018 CLINICAL DATA:  LEFT arm numbness and tachycardia today. Patient with metastatic breast cancer. EXAM: CHEST - 2 VIEW COMPARISON:  02/05/2018 CT and bone scans. 04/26/2016 and prior chest radiographs. FINDINGS: The cardiomediastinal silhouette is unremarkable. A LEFT subclavian Port-A-Cath is present with tip overlying the LOWER SVC. There is no evidence of focal airspace disease, pulmonary edema, suspicious pulmonary nodule/mass, pleural effusion, or pneumothorax. Diffuse sclerotic bony metastases are again noted. IMPRESSION: No evidence of acute cardiopulmonary disease. Diffuse sclerotic bony metastases again noted. Electronically Signed   By: Margarette Canada M.D.   On: 05/11/2018 15:00   Ct Head Wo Contrast  Result Date: 05/11/2018 CLINICAL DATA:  Left arm numbness. Rapid heart rate. Stage IV breast cancer. EXAM: CT HEAD WITHOUT CONTRAST TECHNIQUE: Contiguous axial images were obtained from the base of the skull through the vertex without intravenous contrast. COMPARISON:  None. FINDINGS: Brain: No subdural, epidural, or subarachnoid hemorrhage. Low-attenuation in the white matter of the right frontal lobe superiorly is most consistent with edema. A discrete mass is not seen. The overlying cortex is intact. Cerebellum, brainstem, and basal cisterns are normal. Ventricles and sulci are unremarkable. No midline shift. No acute cortical ischemia  identified. Vascular: No hyperdense vessel or unexpected calcification. Skull: Sclerosis in the calvarium, particularly on the right, consistent with bony metastatic disease. Sinuses/Orbits: No acute finding. Other: None. IMPRESSION: 1. Findings are most consistent with edema in the right frontal white matter. Given history of malignancy, I am suspicious for an underlying mass as the cause for edema. Recommend an MRI with and without contrast for further evaluation. Findings called to Dr. Dayna Barker. Electronically Signed   By: Dorise Bullion III M.D   On: 05/11/2018 17:20   Mr  Mra Head Wo Contrast  Result Date: 05/11/2018 CLINICAL DATA:  Initial evaluation for numbness in left arm. History of metastatic breast cancer. EXAM: MRI HEAD WITHOUT AND WITH CONTRAST MRA HEAD WITHOUT CONTRAST TECHNIQUE: Multiplanar, multiecho pulse sequences of the brain and surrounding structures were obtained without and with intravenous contrast. Angiographic images of the head were obtained using MRA technique without contrast. CONTRAST:  60m MULTIHANCE GADOBENATE DIMEGLUMINE 529 MG/ML IV SOLN COMPARISON:  Comparison made with prior head CT from earlier same day as well as prior bone scan from 02/05/2018 FINDINGS: MRI HEAD FINDINGS Brain: Cerebral volume within normal limits. No significant cerebral white matter disease for age. No evidence for acute or subacute infarct. No encephalomalacia to suggest chronic infarction no evidence for acute or chronic intracranial hemorrhage. There is abnormal nodular dural thickening and enhancement overlying the right frontoparietal convexity near the vertex, concerning for dural base metastasis. Nodular area measures approximately 5.1 cm in length (series 21, image 7). Tumor measures 12 mm in maximal thickness (series 19, image 10). Adjacent dura overlying the right cerebral convexity is diffusely thickened with abnormal enhancement (series 19, image 12). Associated vasogenic edema within the  underlying high right frontoparietal region without significant midline shift (series 14, image 39). No other evidence for intracranial metastatic disease. No other mass lesion or mass effect. No midline shift. No hydrocephalus. No extra-axial fluid collection. Pituitary gland within normal limits. Vascular: Major intracranial vascular flow voids are maintained Skull and upper cervical spine: Craniocervical junction within normal limits. Decreased T1/T2 hypointensity seen throughout the bone marrow of the upper cervical spine, clivus, with scattered involvement of the calvaria, most consistent with osseous metastases. No scalp soft tissue abnormality. Sinuses/Orbits: Globes and orbital soft tissues within normal limits. Mild scattered mucosal thickening throughout the ethmoidal air cells and maxillary sinuses. Left maxillary sinus retention cyst noted. No mastoid effusion. Inner ear structures normal. Other: None. MRA HEAD FINDINGS ANTERIOR CIRCULATION: Visualized distal cervical segments of the internal carotid arteries are patent with antegrade flow. Petrous, cavernous, and supraclinoid segments patent without hemodynamically significant stenosis. Apparent defect at the anterior genu of the cavernous ICAs bilaterally favored to be artifactual. Origin of the ophthalmic arteries patent bilaterally. ICA termini widely patent. A1 segments, anterior communicating artery, and anterior cerebral arteries widely patent. M1 segments patent without stenosis. Distal MCA branches well perfused and symmetric. POSTERIOR CIRCULATION: Vertebral arteries widely patent to the vertebrobasilar junction without stenosis. Left vertebral artery dominant. Right PICA patent. Left PICA not visualized. Basilar widely patent to its distal aspect. Superior cerebellar arteries patent bilaterally. Right PCA supplied via the basilar. Fetal type origin of the left PCA. PCAs widely patent to their distal aspects. No intracranial aneurysm.  IMPRESSION: MRI HEAD IMPRESSION: 1. Dural-based metastasis overlying the right frontoparietal convexity as above. Associated vasogenic edema within the underlying right cerebral hemisphere without significant midline shift. 2. Signal abnormality throughout the visualized bone marrow, compatible with osseous metastatic disease. 3. No other acute intracranial abnormality. No evidence for acute infarct. MRA HEAD IMPRESSION: Normal intracranial MRA. Electronically Signed   By: BJeannine BogaM.D.   On: 05/11/2018 19:39   Mr BJeri CosAnd Wo Contrast  Result Date: 05/11/2018 CLINICAL DATA:  Initial evaluation for numbness in left arm. History of metastatic breast cancer. EXAM: MRI HEAD WITHOUT AND WITH CONTRAST MRA HEAD WITHOUT CONTRAST TECHNIQUE: Multiplanar, multiecho pulse sequences of the brain and surrounding structures were obtained without and with intravenous contrast. Angiographic images of the head were obtained using MRA technique without  contrast. CONTRAST:  53m MULTIHANCE GADOBENATE DIMEGLUMINE 529 MG/ML IV SOLN COMPARISON:  Comparison made with prior head CT from earlier same day as well as prior bone scan from 02/05/2018 FINDINGS: MRI HEAD FINDINGS Brain: Cerebral volume within normal limits. No significant cerebral white matter disease for age. No evidence for acute or subacute infarct. No encephalomalacia to suggest chronic infarction no evidence for acute or chronic intracranial hemorrhage. There is abnormal nodular dural thickening and enhancement overlying the right frontoparietal convexity near the vertex, concerning for dural base metastasis. Nodular area measures approximately 5.1 cm in length (series 21, image 7). Tumor measures 12 mm in maximal thickness (series 19, image 10). Adjacent dura overlying the right cerebral convexity is diffusely thickened with abnormal enhancement (series 19, image 12). Associated vasogenic edema within the underlying high right frontoparietal region without  significant midline shift (series 14, image 39). No other evidence for intracranial metastatic disease. No other mass lesion or mass effect. No midline shift. No hydrocephalus. No extra-axial fluid collection. Pituitary gland within normal limits. Vascular: Major intracranial vascular flow voids are maintained Skull and upper cervical spine: Craniocervical junction within normal limits. Decreased T1/T2 hypointensity seen throughout the bone marrow of the upper cervical spine, clivus, with scattered involvement of the calvaria, most consistent with osseous metastases. No scalp soft tissue abnormality. Sinuses/Orbits: Globes and orbital soft tissues within normal limits. Mild scattered mucosal thickening throughout the ethmoidal air cells and maxillary sinuses. Left maxillary sinus retention cyst noted. No mastoid effusion. Inner ear structures normal. Other: None. MRA HEAD FINDINGS ANTERIOR CIRCULATION: Visualized distal cervical segments of the internal carotid arteries are patent with antegrade flow. Petrous, cavernous, and supraclinoid segments patent without hemodynamically significant stenosis. Apparent defect at the anterior genu of the cavernous ICAs bilaterally favored to be artifactual. Origin of the ophthalmic arteries patent bilaterally. ICA termini widely patent. A1 segments, anterior communicating artery, and anterior cerebral arteries widely patent. M1 segments patent without stenosis. Distal MCA branches well perfused and symmetric. POSTERIOR CIRCULATION: Vertebral arteries widely patent to the vertebrobasilar junction without stenosis. Left vertebral artery dominant. Right PICA patent. Left PICA not visualized. Basilar widely patent to its distal aspect. Superior cerebellar arteries patent bilaterally. Right PCA supplied via the basilar. Fetal type origin of the left PCA. PCAs widely patent to their distal aspects. No intracranial aneurysm. IMPRESSION: MRI HEAD IMPRESSION: 1. Dural-based metastasis  overlying the right frontoparietal convexity as above. Associated vasogenic edema within the underlying right cerebral hemisphere without significant midline shift. 2. Signal abnormality throughout the visualized bone marrow, compatible with osseous metastatic disease. 3. No other acute intracranial abnormality. No evidence for acute infarct. MRA HEAD IMPRESSION: Normal intracranial MRA. Electronically Signed   By: BJeannine BogaM.D.   On: 05/11/2018 19:39    ____________________________________________   PROCEDURES  Procedure(s) performed:   Procedures   ____________________________________________   INITIAL IMPRESSION / ASSESSMENT AND PLAN / ED COURSE  Does have hypokalemia of 2.8 and corrected hypocalcemia of 8.0 that as possible causes for her symptoms however her symptoms were not bilateral or in her legs making these a little bit less likely.  We will treat those.  Also consider TIA however is not a classic story for that either.  She does have some mild ST depressions in the lateral leads which are very slight and in similar morphology to previous EKG but does not seem to be a bit worse.  Troponins negative.  Unclear if this is the cause.  After the further history and found to  have the abnormalities on MRI and CT consistent with metastatic breast cancer to the brain I feel like she likely had a focal seizure.  I discussed with them the possibility of staying in hospital to get EEG and further work-up versus following up with her primary oncologist to further manage this.  Also discussed with neurosurgery and Dr. Delton Coombes.  There is no absolute indication for admission at this time and patient prefers to go home.  Will initiate antiepileptic medications I have asked her to follow-up with her oncologist and with neurology as indicated.  She will need to see neurosurgery in a couple weeks as well.  They suggested starting her on Decadron.  Pertinent labs & imaging results that  were available during my care of the patient were reviewed by me and considered in my medical decision making (see chart for details).  ____________________________________________  FINAL CLINICAL IMPRESSION(S) / ED DIAGNOSES  Final diagnoses:  Metastatic cancer to brain [facility])  Seizure ([place])     MEDICATIONS GIVEN DURING THIS VISIT:  Medications  potassium chloride 10 MEQ/100ML IVPB (  Not Given 05/11/18 1650)  lactated ringers bolus 1,000 mL (0 mLs Intravenous Stopped 05/11/18 1855)  potassium chloride SA (K-DUR,KLOR-CON) CR tablet 40 mEq (40 mEq Oral Given 05/11/18 1645)  calcium carbonate (OS-CAL - dosed in mg of elemental calcium) tablet 500 mg of elemental calcium (500 mg of elemental calcium Oral Given 05/11/18 1709)  gadobenate dimeglumine (MULTIHANCE) injection 14 mL (14 mLs Intravenous Contrast Given 05/11/18 1846)  dexamethasone (DECADRON) injection 4 mg (4 mg Intravenous Given 05/11/18 2046)  levETIRAcetam (KEPPRA) IVPB 1000 mg/100 mL premix (0 mg Intravenous Stopped 05/11/18 2145)  LORazepam (ATIVAN) tablet 1 mg (1 mg Oral Given 05/11/18 2116)     NEW OUTPATIENT MEDICATIONS STARTED DURING THIS VISIT:  Discharge Medication List as of 05/11/2018  9:07 PM    START taking these medications   Details  dexamethasone (DECADRON) 4 MG tablet Take 1 tablet (4 mg total) by mouth 3 (three) times daily for 14 days., Starting Sat 05/12/2018, Until Sat 05/26/2018, Print    levETIRAcetam (KEPPRA) 500 MG tablet Take 1 tablet (500 mg total) by mouth 2 (two) times daily., Starting Fri 05/11/2018, Print        Note:  This note was prepared with assistance of Dragon voice recognition software. Occasional wrong-word or sound-a-like substitutions may have occurred due to the inherent limitations of voice recognition software.   Kerianne Gurr, Corene Cornea, MD 05/12/18 [id number]

## 2018-05-11 NOTE — ED Triage Notes (Signed)
Patient was mowing the grass earlier today and began to have a rapid heart rate and numbness in her left arm that radiated up onto her neck. Intermittent numbness in bilateral feet. History of a stemi in 2001.

## 2018-05-17 ENCOUNTER — Telehealth: Payer: Self-pay | Admitting: Hematology and Oncology

## 2018-05-17 NOTE — Telephone Encounter (Signed)
Called pt re appts that were added per 8/1 sch msg - she couldn't come in today or tomorrow so I added the f/u for 8/5.

## 2018-05-20 NOTE — Assessment & Plan Note (Signed)
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 Was on Arimidex since 08/22/2012 to 03/30/16  Subcutaneous nodule excisionleft chest: Infiltrating carcinoma breast primary, ER positive, PR negative, HER-2 positive  009/03/2017:Left chest wall nodule excision recurrent breast cancer  CT CAP: 08/11/17: Open wound. Diffuse bone mets. Bone Scan 08/11/17: Progression. Left femur mets are new. Existing lesions show inc activity Echo 01/22/18: EF 55-60%  Priortreatment: Herceptinand Perjeta(added 09/22/2017)Q 4 weeks, Faslodex, Zometa, started 04/28/2016   CT CAP:02/05/2018: New 5 mm right lower lobe pulmonary nodule suspicious for new pulmonary metastatic disease. Significant progression of bone metastatic disease. Bone scan: 02/05/2018: Progressive bone metastatic disease with new uptake at L2, posterior right eighth rib, proximal right humerus --------------------------------------------------------------------------- Current treatment: Kadcyla today is cycle 3 every 3 weeks  05/11/18: 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 Associated vasogenic edema within the underlying right cerebral hemisphere without significant midline shift. Signal abnormality throughout the visualized bone marrow, compatible with osseous metastatic disease.   Rec: Brain XRT CT scans sch for 06/04/18  RTC after scans to discuss the result and decide if the systemic therapy needs to be changed.

## 2018-05-21 ENCOUNTER — Inpatient Hospital Stay: Payer: Medicare Other | Attending: Hematology and Oncology | Admitting: Hematology and Oncology

## 2018-05-21 DIAGNOSIS — Z79899 Other long term (current) drug therapy: Secondary | ICD-10-CM | POA: Diagnosis not present

## 2018-05-21 DIAGNOSIS — C7931 Secondary malignant neoplasm of brain: Secondary | ICD-10-CM | POA: Diagnosis not present

## 2018-05-21 DIAGNOSIS — Z9013 Acquired absence of bilateral breasts and nipples: Secondary | ICD-10-CM | POA: Insufficient documentation

## 2018-05-21 DIAGNOSIS — Z170 Estrogen receptor positive status [ER+]: Secondary | ICD-10-CM

## 2018-05-21 DIAGNOSIS — Z79811 Long term (current) use of aromatase inhibitors: Secondary | ICD-10-CM | POA: Diagnosis not present

## 2018-05-21 DIAGNOSIS — R911 Solitary pulmonary nodule: Secondary | ICD-10-CM | POA: Diagnosis not present

## 2018-05-21 DIAGNOSIS — Z923 Personal history of irradiation: Secondary | ICD-10-CM | POA: Insufficient documentation

## 2018-05-21 DIAGNOSIS — Z5112 Encounter for antineoplastic immunotherapy: Secondary | ICD-10-CM | POA: Diagnosis present

## 2018-05-21 DIAGNOSIS — C7951 Secondary malignant neoplasm of bone: Secondary | ICD-10-CM

## 2018-05-21 DIAGNOSIS — C50412 Malignant neoplasm of upper-outer quadrant of left female breast: Secondary | ICD-10-CM | POA: Diagnosis not present

## 2018-05-21 NOTE — Progress Notes (Signed)
Patient Care Team: [name], MD as PCP - General (Family Medicine)  DIAGNOSIS:  Encounter Diagnosis  Name Primary?  . Malignant neoplasm of upper-outer quadrant of left breast in female, estrogen receptor positive ([place])     SUMMARY OF ONCOLOGIC HISTORY:   Breast cancer of upper-outer quadrant of left female breast ([place])   11/14/2011 Surgery    Bilateral mastectomy, prophylactic on the right, left breast IDC 3/18 lymph nodes positive with extracapsular extension ER 89%, PR 81%, HER-2 negative, Ki-67 79% T2 N1 A. stage IIB      12/13/2011 - 06/28/2012 Chemotherapy    4 cycles of FEC followed by 4 cycles of Taxotere      07/17/2012 - 08/22/2012 Radiation Therapy    Adjuvant radiation therapy      08/22/2012 - 03/16/2016 Anti-estrogen oral therapy    Arimidex 1 mg daily      03/16/2016 Relapse/Recurrence    Subcutaneous nodule excision left chest: Infiltrating carcinoma breast primary, ER positive, PR negative      03/29/2016 Imaging    CT CAP and bone scan: Lytic lesions T8 vertebral, T1 posterior element, subcutaneous nodule left lateral chest wall, nonspecific lung nodules; Bone scan: Mets to kull, left humerus, left eighth rib, T7/T8, sternum, left acetabulum      04/28/2016 - 06/17/2017 Chemotherapy    Herceptin, lapatinib, Faslodex, Zometa every 4 weeks, lapatinib discontinued in September 2018 due to elevation of LFTs      06/22/2017 Relapse/Recurrence    Surgical excision:Soft tissue mass left lateral chest wall primary breast cancer, soft tissue mass left medial chest wall breast cancer, tumor is within the dermis extending to the subcutaneous adipose tissue and involves portions of skeletal muscle      08/2017 - 02/16/2018 Chemotherapy    Faslodex with Herceptin and Perjeta along with Zometa every 4 weeks       03/02/2018 -  Chemotherapy    Kadcyla       04/12/2018 -  Chemotherapy    The patient had ado-trastuzumab emtansine (KADCYLA) 260 mg in sodium chloride  0.9 % 250 mL chemo infusion, 3.8 mg/kg = 240 mg, Intravenous, Once, 2 of 5 cycles Administration: 260 mg (04/13/2018), 260 mg (05/04/2018)  for chemotherapy treatment.       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 Associated vasogenic edema within the underlying right cerebral hemisphere without significant midline shift. Signal abnormality throughout the visualized bone marrow, compatible with osseous metastatic disease.          CHIEF COMPLIANT: Follow-up after recent emergency room visit for left arm numbness  INTERVAL HISTORY: [name] Riley is a 36-year with above-mentioned history of metastatic breast cancer HER-2 positive disease and is currently on Kadcyla.  She went to the emergency room complaining of left arm numbness this was after she helped more the lawn.  She had a MRI MRA of the brain and the MRI suggested that there was some edema as well as dural metastases and enhancement.  She was started on Keppra along with dexamethasone and was asked to see [name].  She reports that the symptoms have resolved.  She even went to [place] open golf tournament and enjoyed it.  REVIEW OF SYSTEMS:   Constitutional: Denies fevers, chills or abnormal weight loss Eyes: Denies blurriness of vision Ears, nose, mouth, throat, and face: Denies mucositis or sore throat Respiratory: Denies cough, dyspnea or wheezes Cardiovascular: Denies palpitation, chest discomfort Gastrointestinal:  Denies nausea, heartburn or change in  bowel habits Skin: Denies abnormal skin rashes Lymphatics: Denies new lymphadenopathy or easy bruising Neurological: Occasional dizziness Behavioral/Psych: Mood is stable, no new changes  Extremities: No lower extremity edema   All other systems were reviewed with the patient and are negative.  I have reviewed the past medical history, past surgical history, social history and family history with the patient and they are unchanged  from previous note.  ALLERGIES:  is allergic to cantaloupe extract allergy skin test; contrast media [iodinated diagnostic agents]; pravastatin; and zosyn [piperacillin sod-tazobactam so].  MEDICATIONS:  Current Outpatient Medications  Medication Sig Dispense Refill  . acetaminophen (TYLENOL) 500 MG tablet Take 1,000 mg by mouth every 6 (six) hours as needed for moderate pain.    Marland Kitchen ado-trastuzumab emtansine 3.6 mg/kg in sodium chloride 0.9 % 250 mL Inject 3.6 mg/kg into the vein every 21 ( twenty-one) days.     Marland Kitchen aspirin 81 MG tablet Take 81 mg by mouth daily.      . cholecalciferol (VITAMIN D) 1000 units tablet Take 1 tablet (1,000 Units total) by mouth daily.    Marland Kitchen denosumab (XGEVA) 120 MG/1.7ML SOLN injection Inject 120 mg into the skin once.    Marland Kitchen dexamethasone (DECADRON) 4 MG tablet Take 1 tablet (4 mg total) by mouth 3 (three) times daily for 14 days. 42 tablet 0  . levETIRAcetam (KEPPRA) 500 MG tablet Take 1 tablet (500 mg total) by mouth 2 (two) times daily. 60 tablet 0  . levothyroxine (SYNTHROID, LEVOTHROID) 88 MCG tablet Take 88 mcg by mouth daily before breakfast. BRAND ONLY    . lidocaine-prilocaine (EMLA) cream Apply topically as needed (for port access). Apply to affected area once 30 g 3  . metoprolol succinate (TOPROL-XL) 100 MG 24 hr tablet Take 1 tablet (100 mg total) by mouth daily. 90 tablet 3  . Multiple Vitamins-Minerals (MULTIVITAMIN WITH MINERALS) tablet Take 1 tablet by mouth daily.      . ondansetron (ZOFRAN-ODT) 4 MG disintegrating tablet Take 1 tablet (4 mg total) by mouth every 6 (six) hours as needed. 20 tablet 3  . triamcinolone ointment (KENALOG) 0.5 % Apply 1 application topically 2 (two) times daily. 30 g 0   No current facility-administered medications for this visit.    Facility-Administered Medications Ordered in Other Visits  Medication Dose Route Frequency Provider Last Rate Last Dose  . 0.9 %  sodium chloride infusion   Intravenous Continuous Donnie Mesa, MD      . acetaminophen (TYLENOL) tablet 975 mg  975 mg Oral Q6H Donnie Mesa, MD      . enoxaparin (LOVENOX) injection 40 mg  40 mg Subcutaneous Q24H Donnie Mesa, MD      . morphine 4 MG/ML injection 2-4 mg  2-4 mg Intravenous Q2H PRN Donnie Mesa, MD      . ondansetron (ZOFRAN-ODT) disintegrating tablet 4 mg  4 mg Oral Q6H PRN Donnie Mesa, MD       Or  . ondansetron (ZOFRAN) 4 mg in sodium chloride 0.9 % 50 mL IVPB  4 mg Intravenous Q6H PRN Donnie Mesa, MD      . oxyCODONE (Oxy IR/ROXICODONE) immediate release tablet 5-10 mg  5-10 mg Oral Q4H PRN Donnie Mesa, MD      . piperacillin-tazobactam (ZOSYN) 3.375 g in dextrose 5 % 50 mL IVPB  3.375 g Intravenous Q8H Donnie Mesa, MD      . prochlorperazine (COMPAZINE) tablet 10 mg  10 mg Oral Q6H PRN Donnie Mesa, MD  Or  . prochlorperazine (COMPAZINE) injection 5-10 mg  5-10 mg Intravenous Q6H PRN Donnie Mesa, MD        PHYSICAL EXAMINATION: ECOG PERFORMANCE STATUS: 1 - Symptomatic but completely ambulatory  Vitals:   05/21/18 1003  BP: (!) 154/87  Pulse: 70  Resp: 17  Temp: 98.4 F (36.9 C)  SpO2: 100%   Filed Weights   05/21/18 1003  Weight: 150 lb 1.6 oz (68.1 kg)    GENERAL:alert, no distress and comfortable SKIN: skin color, texture, turgor are normal, no rashes or significant lesions EYES: normal, Conjunctiva are pink and non-injected, sclera clear OROPHARYNX:no exudate, no erythema and lips, buccal mucosa, and tongue normal  NECK: supple, thyroid normal size, non-tender, without nodularity LYMPH:  no palpable lymphadenopathy in the cervical, axillary or inguinal LUNGS: clear to auscultation and percussion with normal breathing effort HEART: regular rate & rhythm and no murmurs and no lower extremity edema ABDOMEN:abdomen soft, non-tender and normal bowel sounds MUSCULOSKELETAL:no cyanosis of digits and no clubbing  NEURO: alert & oriented x 3 with fluent speech, no focal motor/sensory  deficits EXTREMITIES: No lower extremity edema   LABORATORY DATA:  I have reviewed the data as listed CMP Latest Ref Rng & Units 05/11/2018 05/04/2018 04/13/2018  Glucose 70 - 99 mg/dL 88 97 91  BUN 8 - 23 mg/dL 10 9 8   Creatinine 0.44 - 1.00 mg/dL 0.58 0.64 0.53  Sodium 135 - 145 mmol/L 139 139 142  Potassium 3.5 - 5.1 mmol/L 2.8(L) 3.9 3.7  Chloride 98 - 111 mmol/L 101 103 106  CO2 22 - 32 mmol/L 27 28 27   Calcium 8.9 - 10.3 mg/dL 8.0(L) 8.9 8.5(L)  Total Protein 6.5 - 8.1 g/dL - 7.5 7.6  Total Bilirubin 0.3 - 1.2 mg/dL - 0.4 0.6  Alkaline Phos 38 - 126 U/L - 230(H) 223(H)  AST 15 - 41 U/L - 56(H) 59(H)  ALT 0 - 44 U/L - 50(H) 52(H)    Lab Results  Component Value Date   WBC 7.1 05/11/2018   HGB 12.5 05/11/2018   HCT 37.9 05/11/2018   MCV 90.7 05/11/2018   PLT 85 (L) 05/11/2018   NEUTROABS 4.7 05/11/2018    ASSESSMENT & PLAN:  Breast cancer of upper-outer quadrant of left female breast ([place]) Left breast invasive ductal carcinomaT2, N1, 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 Was on Arimidex since 08/22/2012 to 03/30/16  Subcutaneous nodule excisionleft chest: Infiltrating carcinoma breast primary, ER positive, PR negative, HER-2 positive  009/03/2017:Left chest wall nodule excision recurrent breast cancer  CT CAP: 08/11/17: Open wound. Diffuse bone mets. Bone Scan 08/11/17: Progression. Left femur mets are new. Existing lesions show inc activity Echo 01/22/18: EF 55-60%  Priortreatment: Herceptinand Perjeta(added 09/22/2017)Q 4 weeks, Faslodex, Zometa, started 04/28/2016   CT CAP:02/05/2018: New 5 mm right lower lobe pulmonary nodule suspicious for new pulmonary metastatic disease. Significant progression of bone metastatic disease. Bone scan: 02/05/2018: Progressive bone metastatic disease with new uptake at L2,  posterior right eighth rib, proximal right humerus --------------------------------------------------------------------------- Current treatment: Kadcyla today is cycle 3 every 3 weeks  05/11/18: 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 Associated vasogenic edema within the underlying right cerebral hemisphere without significant midline shift. Signal abnormality throughout the visualized bone marrow, compatible with osseous metastatic disease.   Rec: I sent a message to Dr. Valere Dross as  well as Dr. Saintclair Halsted to review her images and consider presenting her in the tumor board. We will see if she needs either surgery or radiation therapy. Currently on dexamethasone I decreased the dosage to 4 mg p.o. twice daily. She was also started on Keppra 500 mg a day.  She has an appointment to see neurologist at [facility].  CT scans sch for 06/04/18.  She will receive Kadcyla this coming Friday.  RTC after scans to discuss the result and decide if the systemic therapy needs to be changed.  No orders of the defined types were placed in this encounter.  The patient has a good understanding of the overall plan. she agrees with it. she will call with any problems that may develop before the next visit here.   Harriette Ohara, MD 05/21/18

## 2018-05-22 ENCOUNTER — Other Ambulatory Visit: Payer: Self-pay | Admitting: Adult Health

## 2018-05-22 ENCOUNTER — Ambulatory Visit (INDEPENDENT_AMBULATORY_CARE_PROVIDER_SITE_OTHER): Payer: Medicare Other

## 2018-05-22 ENCOUNTER — Other Ambulatory Visit: Payer: Self-pay

## 2018-05-22 DIAGNOSIS — Z170 Estrogen receptor positive status [ER+]: Secondary | ICD-10-CM

## 2018-05-22 DIAGNOSIS — Z5111 Encounter for antineoplastic chemotherapy: Secondary | ICD-10-CM

## 2018-05-22 DIAGNOSIS — C50412 Malignant neoplasm of upper-outer quadrant of left female breast: Secondary | ICD-10-CM | POA: Diagnosis not present

## 2018-05-22 DIAGNOSIS — Z09 Encounter for follow-up examination after completed treatment for conditions other than malignant neoplasm: Secondary | ICD-10-CM

## 2018-05-25 ENCOUNTER — Inpatient Hospital Stay: Payer: Medicare Other

## 2018-05-25 ENCOUNTER — Other Ambulatory Visit: Payer: Self-pay | Admitting: Hematology and Oncology

## 2018-05-25 ENCOUNTER — Ambulatory Visit: Payer: Medicare Other | Admitting: Hematology and Oncology

## 2018-05-25 ENCOUNTER — Other Ambulatory Visit: Payer: Self-pay | Admitting: Radiation Therapy

## 2018-05-25 VITALS — BP 159/94 | HR 74 | Temp 98.0°F | Resp 18

## 2018-05-25 DIAGNOSIS — C7951 Secondary malignant neoplasm of bone: Secondary | ICD-10-CM

## 2018-05-25 DIAGNOSIS — Z95828 Presence of other vascular implants and grafts: Secondary | ICD-10-CM

## 2018-05-25 DIAGNOSIS — C50412 Malignant neoplasm of upper-outer quadrant of left female breast: Secondary | ICD-10-CM

## 2018-05-25 DIAGNOSIS — Z170 Estrogen receptor positive status [ER+]: Secondary | ICD-10-CM

## 2018-05-25 DIAGNOSIS — Z5112 Encounter for antineoplastic immunotherapy: Secondary | ICD-10-CM | POA: Diagnosis not present

## 2018-05-25 LAB — CBC WITH DIFFERENTIAL (CANCER CENTER ONLY)
Basophils Absolute: 0 10*3/uL (ref 0.0–0.1)
Basophils Relative: 0 %
Eosinophils Absolute: 0 10*3/uL (ref 0.0–0.5)
Eosinophils Relative: 0 %
HCT: 39.3 % (ref 34.8–46.6)
Hemoglobin: 13.1 g/dL (ref 11.6–15.9)
Lymphocytes Relative: 11 %
Lymphs Abs: 1.3 10*3/uL (ref 0.9–3.3)
MCH: 30.3 pg (ref 25.1–34.0)
MCHC: 33.3 g/dL (ref 31.5–36.0)
MCV: 91 fL (ref 79.5–101.0)
Monocytes Absolute: 0.8 10*3/uL (ref 0.1–0.9)
Monocytes Relative: 7 %
Neutro Abs: 9.6 10*3/uL — ABNORMAL HIGH (ref 1.5–6.5)
Neutrophils Relative %: 82 %
Platelet Count: 206 10*3/uL (ref 145–400)
RBC: 4.32 MIL/uL (ref 3.70–5.45)
RDW: 14.7 % — ABNORMAL HIGH (ref 11.2–14.5)
WBC Count: 11.6 10*3/uL — ABNORMAL HIGH (ref 3.9–10.3)

## 2018-05-25 LAB — CMP (CANCER CENTER ONLY)
ALT: 85 U/L — ABNORMAL HIGH (ref 0–44)
AST: 47 U/L — ABNORMAL HIGH (ref 15–41)
Albumin: 3.5 g/dL (ref 3.5–5.0)
Alkaline Phosphatase: 158 U/L — ABNORMAL HIGH (ref 38–126)
Anion gap: 11 (ref 5–15)
BUN: 19 mg/dL (ref 8–23)
CO2: 24 mmol/L (ref 22–32)
Calcium: 8.2 mg/dL — ABNORMAL LOW (ref 8.9–10.3)
Chloride: 100 mmol/L (ref 98–111)
Creatinine: 0.72 mg/dL (ref 0.44–1.00)
GFR, Est AFR Am: >60 mL/min (ref >60)
GFR, Estimated: >60 mL/min (ref >60)
Glucose, Bld: 172 mg/dL — ABNORMAL HIGH (ref 70–99)
Potassium: 3.6 mmol/L (ref 3.5–5.1)
Sodium: 135 mmol/L (ref 135–145)
Total Bilirubin: 0.6 mg/dL (ref 0.3–1.2)
Total Protein: 6.7 g/dL (ref 6.5–8.1)

## 2018-05-25 MED ORDER — SODIUM CHLORIDE 0.9 % IV SOLN
Freq: Once | INTRAVENOUS | Status: AC
  Administered 2018-05-25: 11:00:00 via INTRAVENOUS
  Filled 2018-05-25: qty 250

## 2018-05-25 MED ORDER — SODIUM CHLORIDE 0.9 % IV SOLN
3.8000 mg/kg | Freq: Once | INTRAVENOUS | Status: AC
  Administered 2018-05-25: 260 mg via INTRAVENOUS
  Filled 2018-05-25: qty 8

## 2018-05-25 MED ORDER — SODIUM CHLORIDE 0.9% FLUSH
10.0000 mL | INTRAVENOUS | Status: DC | PRN
  Administered 2018-05-25: 10 mL
  Filled 2018-05-25: qty 10

## 2018-05-25 MED ORDER — HEPARIN SOD (PORK) LOCK FLUSH 100 UNIT/ML IV SOLN
500.0000 [IU] | Freq: Once | INTRAVENOUS | Status: AC | PRN
  Administered 2018-05-25: 500 [IU]
  Filled 2018-05-25: qty 5

## 2018-05-25 MED ORDER — SODIUM CHLORIDE 0.9% FLUSH
10.0000 mL | Freq: Once | INTRAVENOUS | Status: AC
  Administered 2018-05-25: 10 mL
  Filled 2018-05-25: qty 10

## 2018-05-25 MED ORDER — DIPHENHYDRAMINE HCL 25 MG PO CAPS
50.0000 mg | ORAL_CAPSULE | Freq: Once | ORAL | Status: AC
  Administered 2018-05-25: 50 mg via ORAL

## 2018-05-25 MED ORDER — DIPHENHYDRAMINE HCL 25 MG PO CAPS
ORAL_CAPSULE | ORAL | Status: AC
  Filled 2018-05-25: qty 2

## 2018-05-25 MED ORDER — ACETAMINOPHEN 325 MG PO TABS
ORAL_TABLET | ORAL | Status: AC
  Filled 2018-05-25: qty 2

## 2018-05-25 MED ORDER — ACETAMINOPHEN 325 MG PO TABS
650.0000 mg | ORAL_TABLET | Freq: Once | ORAL | Status: AC
  Administered 2018-05-25: 650 mg via ORAL

## 2018-05-25 NOTE — Progress Notes (Signed)
Pt refused to stay for 37min post observation. Pt discharged to home in stable condition

## 2018-05-25 NOTE — Progress Notes (Signed)
Okay to treat with AST of 85 per Dr.Gudena.

## 2018-05-25 NOTE — Patient Instructions (Signed)
[facility] Discharge Instructions for Patients Receiving Chemotherapy  Today you received the following chemotherapy agents Kadcyla  To help prevent nausea and vomiting after your treatment, we encourage you to take your nausea medication as directed   If you develop nausea and vomiting that is not controlled by your nausea medication, call the clinic.   BELOW ARE SYMPTOMS THAT SHOULD BE REPORTED IMMEDIATELY:  *FEVER GREATER THAN 100.5 F  *CHILLS WITH OR WITHOUT FEVER  NAUSEA AND VOMITING THAT IS NOT CONTROLLED WITH YOUR NAUSEA MEDICATION  *UNUSUAL SHORTNESS OF BREATH  *UNUSUAL BRUISING OR BLEEDING  TENDERNESS IN MOUTH AND THROAT WITH OR WITHOUT PRESENCE OF ULCERS  *URINARY PROBLEMS  *BOWEL PROBLEMS  UNUSUAL RASH Items with * indicate a potential emergency and should be followed up as soon as possible.  Feel free to call the clinic should you have any questions or concerns. The clinic phone number is [phone].  Please show the CHEMO ALERT CARD at check-in to the Emergency Department and triage nurse.   

## 2018-05-31 ENCOUNTER — Other Ambulatory Visit: Payer: Self-pay

## 2018-05-31 ENCOUNTER — Inpatient Hospital Stay (HOSPITAL_COMMUNITY)
Admission: EM | Admit: 2018-05-31 | Discharge: 2018-06-03 | DRG: 872 | Disposition: A | Discharge status: Home / Self Care | Payer: Medicare Other | Attending: Internal Medicine | Admitting: Internal Medicine

## 2018-05-31 ENCOUNTER — Encounter (HOSPITAL_COMMUNITY): Payer: Self-pay | Admitting: Emergency Medicine

## 2018-05-31 DIAGNOSIS — C50919 Malignant neoplasm of unspecified site of unspecified female breast: Secondary | ICD-10-CM | POA: Diagnosis not present

## 2018-05-31 DIAGNOSIS — C7931 Secondary malignant neoplasm of brain: Secondary | ICD-10-CM | POA: Diagnosis present

## 2018-05-31 DIAGNOSIS — Z91018 Allergy to other foods: Secondary | ICD-10-CM

## 2018-05-31 DIAGNOSIS — Z91041 Radiographic dye allergy status: Secondary | ICD-10-CM

## 2018-05-31 DIAGNOSIS — Z9013 Acquired absence of bilateral breasts and nipples: Secondary | ICD-10-CM | POA: Diagnosis not present

## 2018-05-31 DIAGNOSIS — Z800 Family history of malignant neoplasm of digestive organs: Secondary | ICD-10-CM

## 2018-05-31 DIAGNOSIS — I252 Old myocardial infarction: Secondary | ICD-10-CM

## 2018-05-31 DIAGNOSIS — Z7982 Long term (current) use of aspirin: Secondary | ICD-10-CM

## 2018-05-31 DIAGNOSIS — Z801 Family history of malignant neoplasm of trachea, bronchus and lung: Secondary | ICD-10-CM

## 2018-05-31 DIAGNOSIS — R945 Abnormal results of liver function studies: Secondary | ICD-10-CM | POA: Diagnosis not present

## 2018-05-31 DIAGNOSIS — E876 Hypokalemia: Secondary | ICD-10-CM | POA: Diagnosis present

## 2018-05-31 DIAGNOSIS — D649 Anemia, unspecified: Secondary | ICD-10-CM

## 2018-05-31 DIAGNOSIS — Z170 Estrogen receptor positive status [ER+]: Secondary | ICD-10-CM | POA: Diagnosis not present

## 2018-05-31 DIAGNOSIS — R740 Nonspecific elevation of levels of transaminase and lactic acid dehydrogenase [LDH]: Secondary | ICD-10-CM | POA: Diagnosis not present

## 2018-05-31 DIAGNOSIS — E785 Hyperlipidemia, unspecified: Secondary | ICD-10-CM | POA: Diagnosis present

## 2018-05-31 DIAGNOSIS — B9620 Unspecified Escherichia coli [E. coli] as the cause of diseases classified elsewhere: Secondary | ICD-10-CM | POA: Diagnosis present

## 2018-05-31 DIAGNOSIS — I10 Essential (primary) hypertension: Secondary | ICD-10-CM | POA: Diagnosis not present

## 2018-05-31 DIAGNOSIS — C50412 Malignant neoplasm of upper-outer quadrant of left female breast: Secondary | ICD-10-CM | POA: Diagnosis not present

## 2018-05-31 DIAGNOSIS — Z803 Family history of malignant neoplasm of breast: Secondary | ICD-10-CM

## 2018-05-31 DIAGNOSIS — N390 Urinary tract infection, site not specified: Secondary | ICD-10-CM | POA: Diagnosis not present

## 2018-05-31 DIAGNOSIS — Z8041 Family history of malignant neoplasm of ovary: Secondary | ICD-10-CM

## 2018-05-31 DIAGNOSIS — E039 Hypothyroidism, unspecified: Secondary | ICD-10-CM | POA: Diagnosis present

## 2018-05-31 DIAGNOSIS — D6959 Other secondary thrombocytopenia: Secondary | ICD-10-CM | POA: Diagnosis present

## 2018-05-31 DIAGNOSIS — A419 Sepsis, unspecified organism: Secondary | ICD-10-CM | POA: Diagnosis not present

## 2018-05-31 DIAGNOSIS — I2510 Atherosclerotic heart disease of native coronary artery without angina pectoris: Secondary | ICD-10-CM | POA: Diagnosis present

## 2018-05-31 DIAGNOSIS — Z881 Allergy status to other antibiotic agents status: Secondary | ICD-10-CM | POA: Diagnosis not present

## 2018-05-31 DIAGNOSIS — D72829 Elevated white blood cell count, unspecified: Secondary | ICD-10-CM | POA: Diagnosis not present

## 2018-05-31 DIAGNOSIS — Z90710 Acquired absence of both cervix and uterus: Secondary | ICD-10-CM

## 2018-05-31 DIAGNOSIS — Z833 Family history of diabetes mellitus: Secondary | ICD-10-CM

## 2018-05-31 DIAGNOSIS — Z923 Personal history of irradiation: Secondary | ICD-10-CM

## 2018-05-31 DIAGNOSIS — Z9221 Personal history of antineoplastic chemotherapy: Secondary | ICD-10-CM

## 2018-05-31 DIAGNOSIS — Z888 Allergy status to other drugs, medicaments and biological substances status: Secondary | ICD-10-CM | POA: Diagnosis not present

## 2018-05-31 DIAGNOSIS — Z8042 Family history of malignant neoplasm of prostate: Secondary | ICD-10-CM

## 2018-05-31 DIAGNOSIS — Z8249 Family history of ischemic heart disease and other diseases of the circulatory system: Secondary | ICD-10-CM

## 2018-05-31 DIAGNOSIS — C7951 Secondary malignant neoplasm of bone: Secondary | ICD-10-CM | POA: Diagnosis not present

## 2018-05-31 DIAGNOSIS — Z79899 Other long term (current) drug therapy: Secondary | ICD-10-CM

## 2018-05-31 DIAGNOSIS — A4151 Sepsis due to Escherichia coli [E. coli]: Principal | ICD-10-CM | POA: Diagnosis present

## 2018-05-31 DIAGNOSIS — R7989 Other specified abnormal findings of blood chemistry: Secondary | ICD-10-CM | POA: Diagnosis present

## 2018-05-31 DIAGNOSIS — Z79890 Hormone replacement therapy (postmenopausal): Secondary | ICD-10-CM

## 2018-05-31 DIAGNOSIS — E038 Other specified hypothyroidism: Secondary | ICD-10-CM | POA: Diagnosis not present

## 2018-05-31 DIAGNOSIS — D696 Thrombocytopenia, unspecified: Secondary | ICD-10-CM

## 2018-05-31 LAB — COMPREHENSIVE METABOLIC PANEL
ALT: 154 U/L — ABNORMAL HIGH (ref 0–44)
AST: 78 U/L — ABNORMAL HIGH (ref 15–41)
Albumin: 2.9 g/dL — ABNORMAL LOW (ref 3.5–5.0)
Alkaline Phosphatase: 209 U/L — ABNORMAL HIGH (ref 38–126)
Anion gap: 10 (ref 5–15)
BUN: 27 mg/dL — ABNORMAL HIGH (ref 8–23)
CO2: 25 mmol/L (ref 22–32)
Calcium: 8.1 mg/dL — ABNORMAL LOW (ref 8.9–10.3)
Chloride: 100 mmol/L (ref 98–111)
Creatinine, Ser: 0.9 mg/dL (ref 0.44–1.00)
GFR calc Af Amer: >60 mL/min (ref >60)
GFR calc non Af Amer: >60 mL/min (ref >60)
Glucose, Bld: 134 mg/dL — ABNORMAL HIGH (ref 70–99)
Potassium: 3.2 mmol/L — ABNORMAL LOW (ref 3.5–5.1)
Sodium: 135 mmol/L (ref 135–145)
Total Bilirubin: 1.1 mg/dL (ref 0.3–1.2)
Total Protein: 6.2 g/dL — ABNORMAL LOW (ref 6.5–8.1)

## 2018-05-31 LAB — CBC WITH DIFFERENTIAL/PLATELET
Basophils Absolute: 0 10*3/uL (ref 0.0–0.1)
Basophils Relative: 0 %
Eosinophils Absolute: 0 10*3/uL (ref 0.0–0.7)
Eosinophils Relative: 0 %
HCT: 37.2 % (ref 36.0–46.0)
Hemoglobin: 12.5 g/dL (ref 12.0–15.0)
Lymphocytes Relative: 7 %
Lymphs Abs: 1.1 10*3/uL (ref 0.7–4.0)
MCH: 30.5 pg (ref 26.0–34.0)
MCHC: 33.6 g/dL (ref 30.0–36.0)
MCV: 90.7 fL (ref 78.0–100.0)
Monocytes Absolute: 1.7 10*3/uL — ABNORMAL HIGH (ref 0.1–1.0)
Monocytes Relative: 11 %
Neutro Abs: 13.3 10*3/uL — ABNORMAL HIGH (ref 1.7–7.7)
Neutrophils Relative %: 82 %
Platelets: 36 10*3/uL — ABNORMAL LOW (ref 150–400)
RBC: 4.1 MIL/uL (ref 3.87–5.11)
RDW: 14.9 % (ref 11.5–15.5)
WBC: 16.1 10*3/uL — ABNORMAL HIGH (ref 4.0–10.5)

## 2018-05-31 LAB — I-STAT CG4 LACTIC ACID, ED
Lactic Acid, Venous: 2.07 mmol/L (ref 0.5–1.9)
Lactic Acid, Venous: 1.14 mmol/L (ref 0.5–1.9)

## 2018-05-31 LAB — DIC (DISSEMINATED INTRAVASCULAR COAGULATION) PANEL (NOT AT ARMC)
Platelets: 34 10*3/uL — ABNORMAL LOW (ref 150–400)
Smear Review: NONE SEEN
aPTT: 34 seconds (ref 24–36)

## 2018-05-31 LAB — SAVE SMEAR

## 2018-05-31 LAB — DIC (DISSEMINATED INTRAVASCULAR COAGULATION)PANEL
D-Dimer, Quant: 1.94 ug/mL-FEU — ABNORMAL HIGH (ref 0.00–0.50)
Fibrinogen: 725 mg/dL — ABNORMAL HIGH (ref 210–475)
INR: 1.12
Prothrombin Time: 14.3 seconds (ref 11.4–15.2)

## 2018-05-31 LAB — URINALYSIS, ROUTINE W REFLEX MICROSCOPIC
Bilirubin Urine: NEGATIVE
Glucose, UA: NEGATIVE mg/dL
Ketones, ur: NEGATIVE mg/dL
Nitrite: NEGATIVE
Protein, ur: 30 mg/dL — AB
Specific Gravity, Urine: 1.01 (ref 1.005–1.030)
pH: 6 (ref 5.0–8.0)

## 2018-05-31 LAB — PHOSPHORUS: Phosphorus: 2.6 mg/dL (ref 2.5–4.6)

## 2018-05-31 LAB — URINALYSIS, MICROSCOPIC (REFLEX): WBC, UA: >50 WBC/hpf (ref 0–5)

## 2018-05-31 LAB — TSH: TSH: 1.222 u[IU]/mL (ref 0.350–4.500)

## 2018-05-31 LAB — MAGNESIUM: Magnesium: 2.1 mg/dL (ref 1.7–2.4)

## 2018-05-31 MED ORDER — ONDANSETRON HCL 4 MG/2ML IJ SOLN: 4.0000 mg | Freq: Four times a day (QID) | INTRAMUSCULAR | Status: DC | PRN

## 2018-05-31 MED ORDER — SODIUM CHLORIDE 0.9 % IV SOLN
INTRAVENOUS | Status: AC
  Administered 2018-05-31 – 2018-06-01 (×2): via INTRAVENOUS

## 2018-05-31 MED ORDER — POTASSIUM CHLORIDE CRYS ER 20 MEQ PO TBCR
40.0000 meq | EXTENDED_RELEASE_TABLET | ORAL | Status: AC
  Administered 2018-05-31 (×2): 40 meq via ORAL
  Filled 2018-05-31 (×2): qty 2

## 2018-05-31 MED ORDER — LIDOCAINE-PRILOCAINE 2.5-2.5 % EX CREA
TOPICAL_CREAM | Freq: Once | CUTANEOUS | Status: AC
  Administered 2018-05-31: 08:00:00 via TOPICAL
  Filled 2018-05-31: qty 5

## 2018-05-31 MED ORDER — DIPHENHYDRAMINE HCL 25 MG PO CAPS
50.0000 mg | ORAL_CAPSULE | Freq: Every evening | ORAL | Status: DC | PRN
  Filled 2018-05-31: qty 2

## 2018-05-31 MED ORDER — LACTATED RINGERS IV BOLUS
1000.0000 mL | Freq: Once | INTRAVENOUS | Status: AC
  Administered 2018-05-31: 1000 mL via INTRAVENOUS

## 2018-05-31 MED ORDER — ADULT MULTIVITAMIN W/MINERALS CH
1.0000 | ORAL_TABLET | Freq: Every day | ORAL | Status: DC
  Filled 2018-05-31: qty 1

## 2018-05-31 MED ORDER — ACETAMINOPHEN 500 MG PO TABS
1000.0000 mg | ORAL_TABLET | Freq: Four times a day (QID) | ORAL | Status: DC | PRN
  Administered 2018-05-31 – 2018-06-02 (×5): 1000 mg via ORAL
  Filled 2018-05-31 (×5): qty 2

## 2018-05-31 MED ORDER — ONDANSETRON HCL 4 MG PO TABS: 4.0000 mg | ORAL_TABLET | Freq: Four times a day (QID) | ORAL | Status: DC | PRN

## 2018-05-31 MED ORDER — LEVETIRACETAM 500 MG PO TABS
500.0000 mg | ORAL_TABLET | Freq: Two times a day (BID) | ORAL | Status: DC
  Administered 2018-05-31 – 2018-06-03 (×7): 500 mg via ORAL
  Filled 2018-05-31 (×7): qty 1

## 2018-05-31 MED ORDER — LIDOCAINE-PRILOCAINE 2.5-2.5 % EX CREA: TOPICAL_CREAM | CUTANEOUS | Status: DC | PRN

## 2018-05-31 MED ORDER — ACETAMINOPHEN 325 MG PO TABS
650.0000 mg | ORAL_TABLET | Freq: Once | ORAL | Status: AC
  Administered 2018-05-31: 650 mg via ORAL
  Filled 2018-05-31: qty 2

## 2018-05-31 MED ORDER — METOPROLOL SUCCINATE ER 50 MG PO TB24
100.0000 mg | ORAL_TABLET | Freq: Every day | ORAL | Status: DC
  Administered 2018-05-31 – 2018-06-03 (×4): 100 mg via ORAL
  Filled 2018-05-31 (×4): qty 2

## 2018-05-31 MED ORDER — SENNOSIDES-DOCUSATE SODIUM 8.6-50 MG PO TABS: 1.0000 | ORAL_TABLET | Freq: Every evening | ORAL | Status: DC | PRN

## 2018-05-31 MED ORDER — DOCUSATE SODIUM 100 MG PO CAPS
100.0000 mg | ORAL_CAPSULE | Freq: Two times a day (BID) | ORAL | Status: DC
  Administered 2018-05-31 – 2018-06-03 (×6): 100 mg via ORAL
  Filled 2018-05-31 (×6): qty 1

## 2018-05-31 MED ORDER — LEVOTHYROXINE SODIUM 88 MCG PO TABS
88.0000 ug | ORAL_TABLET | Freq: Every day | ORAL | Status: DC
  Administered 2018-06-01 – 2018-06-03 (×3): 88 ug via ORAL
  Filled 2018-05-31 (×3): qty 1

## 2018-05-31 MED ORDER — SODIUM CHLORIDE 0.9 % IV SOLN: 1.0000 g | INTRAVENOUS | Status: DC

## 2018-05-31 MED ORDER — ASPIRIN EC 81 MG PO TBEC
81.0000 mg | DELAYED_RELEASE_TABLET | Freq: Every day | ORAL | Status: DC
  Administered 2018-05-31 – 2018-06-03 (×4): 81 mg via ORAL
  Filled 2018-05-31 (×4): qty 1

## 2018-05-31 MED ORDER — VITAMIN D3 25 MCG (1000 UNIT) PO TABS
1000.0000 [IU] | ORAL_TABLET | Freq: Every day | ORAL | Status: DC
  Administered 2018-06-01 – 2018-06-03 (×3): 1000 [IU] via ORAL
  Filled 2018-05-31 (×3): qty 1

## 2018-05-31 MED ORDER — SODIUM CHLORIDE 0.9 % IV SOLN
1.0000 g | Freq: Once | INTRAVENOUS | Status: AC
  Administered 2018-05-31: 1 g via INTRAVENOUS
  Filled 2018-05-31: qty 10

## 2018-05-31 NOTE — Progress Notes (Signed)
ONYINYECHI HUANTE   DOB:[birth year]   WL#:[id number]   RDE#:[id number]  Oncology f/u   Subjective: I am covering my partner Dr. Lindi Adie to see pt. she presented to [facility] emergency room today for fever, dysuria, and generalized weakness.  Symptoms has been going on for about 2 days.  She had chills at home too. She feels much better when I saw her on the floor.  Objective:  Vitals:   05/31/18 1400 05/31/18 1559  BP: 137/79   Pulse: 84   Resp: 18   Temp: 98.6 F (37 C) 99.6 F (37.6 C)  SpO2: 99%     Body mass index is 22.81 kg/m.  Intake/Output Summary (Last 24 hours) at 05/31/2018 1839 Last data filed at 05/31/2018 1800 Gross per 24 hour  Intake 1101.73 ml  Output 1050 ml  Net 51.73 ml     Sclerae unicteric  Oropharynx clear  No peripheral adenopathy  Lungs clear -- no rales or rhonchi  Heart regular rate and rhythm  Abdomen benign  MSK no focal spinal tenderness, no peripheral edema  Neuro nonfocal   CBG (last 3)  No results for input(s): GLUCAP in the last 72 hours.   Labs:  Lab Results  Component Value Date   WBC 16.1 (H) 05/31/2018   HGB 12.5 05/31/2018   HCT 37.2 05/31/2018   MCV 90.7 05/31/2018   PLT 34 (L) 05/31/2018   NEUTROABS 13.3 (H) 05/31/2018   CMP Latest Ref Rng & Units 05/31/2018 05/25/2018 05/11/2018  Glucose 70 - 99 mg/dL 134(H) 172(H) 88  BUN 8 - 23 mg/dL 27(H) 19 10  Creatinine 0.44 - 1.00 mg/dL 0.90 0.72 0.58  Sodium 135 - 145 mmol/L 135 135 139  Potassium 3.5 - 5.1 mmol/L 3.2(L) 3.6 2.8(L)  Chloride 98 - 111 mmol/L 100 100 101  CO2 22 - 32 mmol/L 25 24 27   Calcium 8.9 - 10.3 mg/dL 8.1(L) 8.2(L) 8.0(L)  Total Protein 6.5 - 8.1 g/dL 6.2(L) 6.7 -  Total Bilirubin 0.3 - 1.2 mg/dL 1.1 0.6 -  Alkaline Phos 38 - 126 U/L 209(H) 158(H) -  AST 15 - 41 U/L 78(H) 47(H) -  ALT 0 - 44 U/L 154(H) 85(H) -    Urine Studies No results for input(s): UHGB, CRYS in the last 72 hours.  Invalid input(s): UACOL, UAPR, USPG, UPH, UTP, UGL, UKET, UBIL, UNIT, UROB,  ULEU, UEPI, UWBC, URBC, UBAC, CAST, UCOM, BILUA  Basic Metabolic Panel: Recent Labs  Lab 05/25/18 0805 05/31/18 0843  NA 135 135  K 3.6 3.2*  CL 100 100  CO2 24 25  GLUCOSE 172* 134*  BUN 19 27*  CREATININE 0.72 0.90  CALCIUM 8.2* 8.1*  MG  --  2.1  PHOS  --  2.6   GFR Estimated Creatinine Clearance: 62.9 mL/min (by C-G formula based on SCr of 0.9 mg/dL). Liver Function Tests: Recent Labs  Lab 05/25/18 0805 05/31/18 0843  AST 47* 78*  ALT 85* 154*  ALKPHOS 158* 209*  BILITOT 0.6 1.1  PROT 6.7 6.2*  ALBUMIN 3.5 2.9*   No results for input(s): LIPASE, AMYLASE in the last 168 hours. No results for input(s): AMMONIA in the last 168 hours. Coagulation profile Recent Labs  Lab 05/31/18 1444  INR 1.12    CBC: Recent Labs  Lab 05/25/18 0805 05/31/18 0843 05/31/18 1444  WBC 11.6* 16.1*  --   NEUTROABS 9.6* 13.3*  --   HGB 13.1 12.5  --   HCT 39.3 37.2  --  MCV 91.0 90.7  --   PLT 206 36* 34*   Cardiac Enzymes: No results for input(s): CKTOTAL, CKMB, CKMBINDEX, TROPONINI in the last 168 hours. BNP: Invalid input(s): POCBNP CBG: No results for input(s): GLUCAP in the last 168 hours. D-Dimer Recent Labs    05/31/18 1444  DDIMER 1.94*   Hgb A1c No results for input(s): HGBA1C in the last 72 hours. Lipid Profile No results for input(s): CHOL, HDL, LDLCALC, TRIG, CHOLHDL, LDLDIRECT in the last 72 hours. Thyroid function studies Recent Labs    05/31/18 0843  TSH 1.222   Anemia work up No results for input(s): VITAMINB12, FOLATE, FERRITIN, TIBC, IRON, RETICCTPCT in the last 72 hours. Microbiology No results found for this or any previous visit (from the past 240 hour(s)).    Studies:  No results found.  Assessment: 67 y.o. with metastatic breast cancer, currently on Kadcyla, presented with fever, chills, dysuria and weakness  1.  UTI and sepsis 2.  Thrombocytopenia, likely secondary to chemo 3.  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 Anemia  5.  Leukocytosis, secondary to infection 6.  Hypokalemia 7.  Transaminitis, possible secondary to chemo  Plan:  -I agree with antibiotics for her UTI, she is feeling better already  -will monitor her CBC. DIC panel ordered, will follow results  -If plt recovers well, infection controlled, OK to discharge in a few days -I have sent a message to our neuro oncologist Dr. Mickeal Skinner to review her recent brain MRI -She has a restaging CT scan next Monday, and will follow-up with Dr. Lindi Adie afterwards. -please call me if there is any questions.  -I appreciate the hospitalist team care   Truitt Merle, MD 05/31/2018  6:39 PM

## 2018-05-31 NOTE — ED Notes (Signed)
MD AND RN NOTIFIED OF PATIENT'S LACTIC ACID LEVEL OF 2.07

## 2018-05-31 NOTE — ED Triage Notes (Signed)
Patient from home being treated for CA, recently started taking steroids. Pt has become increasingly weak over last few days, reports shaking all over and has felt like she has had a fever. Husband reports pt had difficulty walking to the bathroom this morning. Pt also reports cloudy urine and is concerned for UTI.

## 2018-05-31 NOTE — ED Notes (Signed)
ED TO INPATIENT HANDOFF REPORT  Name/Age/Gender [name] Riley 67 y.o. female  Code Status Code Status History    Date Active Date Inactive Code Status Order ID Comments User Context   06/29/2017 0939 07/03/2017 1555 Full Code [id number]  Donnie Mesa, MD Inpatient   06/29/2017 0856 06/29/2017 0935 Full Code [id number]  Donnie Mesa, MD Outpatient   04/25/2016 1352 04/26/2016 1704 Full Code [id number]  Donnie Mesa, MD Inpatient   10/23/2014 1502 10/24/2014 2039 Full Code [id number]  Blain Pais, MD Inpatient   11/14/2011 1448 11/16/2011 1250 Full Code [id number]  Roosvelt Harps, RN Inpatient      Home/SNF/Other Home  Chief Complaint chemo pat, fever and weakness  Level of Care/Admitting Diagnosis ED Disposition    ED Disposition Condition [facility]: [facility] [[id number]]  Level of Care: Med-Surg [16]  Diagnosis: UTI (urinary tract infection) [740814]  Admitting Physician: Thayer, [place] [[id number]]  Attending Physician: Raiford Noble LATIF [[id number]]  Estimated length of stay: past midnight tomorrow  Certification:: I certify this patient will need inpatient services for at least 2 midnights  PT Class (Do Not Modify): Inpatient [101]  PT Acc Code (Do Not Modify): Private [1]       Medical History Past Medical History:  Diagnosis Date  . Allergy   . Breast cancer ([place]) 08/25/2011   L , invasive ductal carcinoma, ER/PR +,HER2 -  . Cancer [facility])    left breast cancer  . Coronary artery disease 2001  . Heart attack [facility]) 09/2000   Sep 25, 2000  --no intervention  . History of chemotherapy comp. 08/22/2012   4 cycles of FEC and $ cycles of Taxotere  . Hyperlipidemia   . Hypertension   . Hypothyroidism   . PONV (postoperative nausea and vomiting)    gets sick from anesthesia  . Status post radiation therapy 07/09/12 - 08/22/2012   Left Breast, 60.4 gray    Allergies Allergies  Allergen Reactions  . Cantaloupe Extract  Allergy Skin Test Shortness Of Breath  . Contrast Media [Iodinated Diagnostic Agents] Shortness Of Breath and Rash  . Pravastatin Other (See Comments)    Legs hurt  . Zosyn [Piperacillin Sod-Tazobactam So] Rash and Other (See Comments)    Temperature increase, facial flushing    IV Location/Drains/Wounds Patient Lines/Drains/Airways Status   Active Line/Drains/Airways    Name:   Placement date:   Placement time:   Site:   Days:   Implanted Port 04/25/16 Left Chest   04/25/16    0942    Chest   766   Peripheral IV 05/31/18 Right Wrist   05/31/18    0851    Wrist   less than 1   Incision (Closed) 07/01/17 Breast Left   07/01/17    0937     334          Labs/Imaging Results for orders placed or performed during the hospital encounter of 05/31/18 (from the past 48 hour(s))  Comprehensive metabolic panel     Status: Abnormal   Collection Time: 05/31/18  8:43 AM  Result Value Ref Range   Sodium 135 135 - 145 mmol/L   Potassium 3.2 (L) 3.5 - 5.1 mmol/L   Chloride 100 98 - 111 mmol/L   CO2 25 22 - 32 mmol/L   Glucose, Bld 134 (H) 70 - 99 mg/dL   BUN 27 (H) 8 - 23 mg/dL   Creatinine, Ser 0.90 0.44 - 1.00 mg/dL  Calcium 8.1 (L) 8.9 - 10.3 mg/dL   Total Protein 6.2 (L) 6.5 - 8.1 g/dL   Albumin 2.9 (L) 3.5 - 5.0 g/dL   AST 78 (H) 15 - 41 U/L   ALT 154 (H) 0 - 44 U/L   Alkaline Phosphatase 209 (H) 38 - 126 U/L   Total Bilirubin 1.1 0.3 - 1.2 mg/dL   GFR calc non Af Amer >60 >60 mL/min   GFR calc Af Amer >60 >60 mL/min    Comment: (NOTE) The eGFR has been calculated using the CKD EPI equation. This calculation has not been validated in all clinical situations. eGFR's persistently <60 mL/min signify possible Chronic Kidney Disease.    Anion gap 10 5 - 15    Comment: Performed at [facility], [place] [address] [place], [place] [address]  CBC WITH DIFFERENTIAL     Status: Abnormal   Collection Time: 05/31/18  8:43 AM  Result Value Ref Range   WBC 16.1 (H) 4.0 -  10.5 K/uL   RBC 4.10 3.87 - 5.11 MIL/uL   Hemoglobin 12.5 12.0 - 15.0 g/dL   HCT 37.2 36.0 - 46.0 %   MCV 90.7 78.0 - 100.0 fL   MCH 30.5 26.0 - 34.0 pg   MCHC 33.6 30.0 - 36.0 g/dL   RDW 14.9 11.5 - 15.5 %   Platelets 36 (L) 150 - 400 K/uL    Comment: REPEATED TO VERIFY SPECIMEN CHECKED FOR CLOTS PLATELET COUNT CONFIRMED BY SMEAR    Neutrophils Relative % 82 %   Neutro Abs 13.3 (H) 1.7 - 7.7 K/uL   Lymphocytes Relative 7 %   Lymphs Abs 1.1 0.7 - 4.0 K/uL   Monocytes Relative 11 %   Monocytes Absolute 1.7 (H) 0.1 - 1.0 K/uL   Eosinophils Relative 0 %   Eosinophils Absolute 0.0 0.0 - 0.7 K/uL   Basophils Relative 0 %   Basophils Absolute 0.0 0.0 - 0.1 K/uL    Comment: Performed at [facility], [place] [address], [place], [place] [address]  Urinalysis, Routine w reflex microscopic     Status: Abnormal   Collection Time: 05/31/18  8:43 AM  Result Value Ref Range   Color, Urine YELLOW YELLOW   APPearance TURBID (A) CLEAR   Specific Gravity, Urine 1.010 1.005 - 1.030   pH 6.0 5.0 - 8.0   Glucose, UA NEGATIVE NEGATIVE mg/dL   Hgb urine dipstick LARGE (A) NEGATIVE   Bilirubin Urine NEGATIVE NEGATIVE   Ketones, ur NEGATIVE NEGATIVE mg/dL   Protein, ur 30 (A) NEGATIVE mg/dL   Nitrite NEGATIVE NEGATIVE   Leukocytes, UA LARGE (A) NEGATIVE    Comment: Performed at [place] [address], [place], [place] [address]  Urinalysis, Microscopic (reflex)     Status: Abnormal   Collection Time: 05/31/18  8:43 AM  Result Value Ref Range   RBC / HPF 21-50 0 - 5 RBC/hpf   WBC, UA >50 0 - 5 WBC/hpf   Bacteria, UA MANY (A) NONE SEEN   Squamous Epithelial / LPF 0-5 0 - 5    Comment: Performed at [facility], [place] [address] [place], [place] [address]  I-Stat CG4 Lactic Acid, ED  (not at  [facility])     Status: Abnormal   Collection Time: 05/31/18  8:45 AM  Result Value Ref Range   Lactic Acid, Venous 2.07 (HH) 0.5 - 1.9 mmol/L    Comment NOTIFIED PHYSICIAN   I-Stat CG4 Lactic Acid,  ED  (not at  [facility])     Status: None   Collection Time: 05/31/18 10:43 AM  Result Value Ref Range   Lactic Acid, Venous 1.14 0.5 - 1.9 mmol/L   No results found.  Pending Labs Unresulted Labs (From admission, onward)    Start     Ordered   05/31/18 1132  DIC panel  STAT,   STAT     05/31/18 1132   05/31/18 1131  Save smear  Once,   R     05/31/18 1130   05/31/18 0939  Urine culture  STAT,   STAT     05/31/18 1030   05/31/18 0805  Blood Culture (routine x 2)  BLOOD CULTURE X 2,   STAT     05/31/18 0805   Signed and Held  Magnesium  Add-on,   R     Signed and Held   Signed and Held  Phosphorus  Add-on,   R     Signed and Held   Signed and Held  TSH  Add-on,   R     Signed and Held   Signed and Held  Comprehensive metabolic panel  Tomorrow morning,   R     Signed and Held   Signed and Held  CBC  Tomorrow morning,   R     Signed and Held          Vitals/Pain Today's Vitals   05/31/18 1200 05/31/18 1215 05/31/18 1230 05/31/18 1300  BP: 116/69  119/75 121/75  Pulse: 78 80 77 77  Resp: (!) _0 Temp:      TempSrc:      SpO2: 96% 97% 97% 97%  Weight:      Height:      PainSc:        Isolation Precautions No active isolations  Medications Medications  lactated ringers bolus 1,000 mL (0 mLs Intravenous Stopped 05/31/18 1001)  lidocaine-prilocaine (EMLA) cream ( Topical Given 05/31/18 0823)  cefTRIAXone (ROCEPHIN) 1 g in sodium chloride 0.9 % 100 mL IVPB ( Intravenous Stopped 05/31/18 1027)  acetaminophen (TYLENOL) tablet 650 mg (650 mg Oral Given 05/31/18 0948)    Mobility walks with person assist

## 2018-05-31 NOTE — ED Notes (Signed)
Delay in transport - hospitalist paged regarding need for cardiac monitor. Waiting for response.

## 2018-05-31 NOTE — ED Provider Notes (Signed)
Emergency Department Provider Note   I have reviewed the triage vital signs and the nursing notes.   HISTORY  Chief Complaint Weakness and Fever   HPI [name] Riley is a 67 y.o. female with a history of metastatic breast cancer that is currently being evaluated for possible radiation treatment to the new dural metastasis that presents to the emergency department today secondary to chills and cloudy urine.  Patient states that she is been doing fine but last night she had a episode of fall on body shaking and she felt warm.  Husband states that she was not unresponsive during this time.  He states that it lasted for few minutes and this morning she felt warm so he brought here for further evaluation.  She denies any cough, chest pain, abdominal pain, nausea, vomiting, diarrhea or other infectious symptoms.  Does have cloudy urine but no dysuria.  No back pain. No other associated or modifying symptoms.    Past Medical History:  Diagnosis Date  . Allergy   . Breast cancer ([place]) 08/25/2011   L , invasive ductal carcinoma, ER/PR +,HER2 -  . Cancer [facility])    left breast cancer  . Coronary artery disease 2001  . Heart attack [facility]) 09/2000   Sep 25, 2000  --no intervention  . History of chemotherapy comp. 08/22/2012   4 cycles of FEC and $ cycles of Taxotere  . Hyperlipidemia   . Hypertension   . Hypothyroidism   . PONV (postoperative nausea and vomiting)    gets sick from anesthesia  . Status post radiation therapy 07/09/12 - 08/22/2012   Left Breast, 60.4 gray    Patient Active Problem List   Diagnosis Date Noted  . Port-A-Cath in place 03/23/2018  . Elevated LFTs   . Metastatic breast cancer ([place]) 06/29/2017  . Nodule of skin of breast 03/06/2017  . Abscess, abdomen 07/06/2016  . 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 Pneumothorax on left 04/25/2016  . Chest pain 10/23/2014  . Radiation-induced dermatitis 07/27/2012  . Hypertension   . Breast cancer of upper-outer  quadrant of left female breast ([place]) 09/01/2011  . Hypothyroidism 06/12/2009  . HYPERLIPIDEMIA 06/12/2009  . Essential hypertension 06/12/2009  . Coronary atherosclerosis 06/12/2009  . DIZZINESS 06/12/2009  . PALPITATIONS 06/12/2009  . Heart attack ([place]) 09/16/2000    Past Surgical History:  Procedure Laterality Date  . ABDOMINAL HYSTERECTOMY  1998   TAH, oophorectomy  . APPENDECTOMY  1970  . BREAST SURGERY  1998   removal of benign lump in rt breast  . BREAST SURGERY  11/14/11   right simple mastectomy, left mrm  . INCISION AND DRAINAGE OF WOUND Left 07/01/2017   Procedure: IRRIGATION AND DEBRIDEMENT CHEST WALL ABSCESS;  Surgeon: Donnie Mesa, MD;  Location: [facility];  Service: General;  Laterality: Left;  Marland Kitchen MASS EXCISION Left 06/22/2017   Procedure: EXCISION OF CHEST WALL MASSES;  Surgeon: Donnie Mesa, MD;  Location: [place];  Service: General;  Laterality: Left;  Marland Kitchen MASTECTOMY Bilateral    for left breast cancer  . OVARIAN CYST SURGERY Right 1970  . PORT-A-CATH REMOVAL  08/30/2012   Procedure: REMOVAL PORT-A-CATH;  Surgeon: Imogene Burn. [name], MD;  Location: [place];  Service: General;  Laterality: Right;  port removal  . PORTACATH PLACEMENT  11/14/2011   Procedure: INSERTION PORT-A-CATH;  Surgeon: Imogene Burn. [name], MD;  Location: [place];  Service: General;  Laterality: Right;  . PORTACATH PLACEMENT N/A 04/25/2016   Procedure: INSERTION  PORT-A-CATH LEFT CHEST;  Surgeon: Donnie Mesa, MD;  Location: [place];  Service: General;  Laterality: N/A;  . skin tags  05/09/1997   left axillary left neck skin tags  . TONSILLECTOMY  1968    Current Outpatient Rx  . Order #: [id number] Class: Historical Med  . Order #: [id number] Class: Historical Med  . Order #: [id number] Class: No Print  . Order #: [id number] Class: Historical Med  . Order #: [id number] Class: Print  . Order #: [id number] Class: Historical Med  . Order #: [id number] Class: Normal  . Order #:  [id number] Class: Normal  . Order #: [id number] Class: Historical Med  . Order #: [id number] Class: Normal  . Order #: [id number] Class: Normal    Allergies Cantaloupe extract allergy skin test; Contrast media [iodinated diagnostic agents]; Pravastatin; and Zosyn [piperacillin sod-tazobactam so]  Family History  Problem Relation Age of Onset  . Hypertension Maternal Grandmother   . Diabetes Maternal Grandmother   . Cancer Father 76       lung cancer and Prostate Cancer  . Hypertension Mother   . Cancer Paternal Aunt        ovarian  . Cancer Cousin        breast, paternal cousin  . Cancer Paternal Uncle        stomach  . Cancer Paternal Grandfather        Esophagus  . Colon cancer Neg Hx     Social History Social History   Tobacco Use  . Smoking status: Never Smoker  . Smokeless tobacco: Never Used  Substance Use Topics  . Alcohol use: No  . Drug use: No    Review of Systems  All other systems negative except as documented in the HPI. All pertinent positives and negatives as reviewed in the HPI. ____________________________________________   PHYSICAL EXAM:  VITAL SIGNS: ED Triage Vitals  Enc Vitals Group     BP 05/31/18 0757 139/79     Pulse Rate 05/31/18 0757 (!) 109     Resp 05/31/18 0757 19     Temp 05/31/18 0757 98.9 F (37.2 C)     Temp Source 05/31/18 0757 Oral     SpO2 05/31/18 0757 97 %     Weight 05/31/18 0801 150 lb (68 kg)     Height 05/31/18 0801 5' 8"  (1.727 m)    Constitutional: Alert and oriented. Well appearing and in no acute distress. Eyes: Conjunctivae are normal. PERRL. EOMI. Head: Atraumatic. Nose: No congestion/rhinnorhea. Mouth/Throat: Mucous membranes are moist.  Oropharynx non-erythematous. Neck: No stridor.  No meningeal signs.   Cardiovascular: tachycardic rate, regular rhythm. Good peripheral circulation. Grossly normal heart sounds.   Respiratory: Normal respiratory effort.  No retractions. Lungs CTAB. Gastrointestinal: Soft and  nontender. No distention.  Musculoskeletal: No lower extremity tenderness nor edema. No gross deformities of extremities. Neurologic:  Normal speech and language. No gross focal neurologic deficits are appreciated.  Skin:  Skin is warm, dry and intact. No rash noted.  ____________________________________________   LABS (all labs ordered are listed, but only abnormal results are displayed)  Labs Reviewed  COMPREHENSIVE METABOLIC PANEL - Abnormal; Notable for the following components:      Result Value   Potassium 3.2 (*)    Glucose, Bld 134 (*)    BUN 27 (*)    Calcium 8.1 (*)    Total Protein 6.2 (*)    Albumin 2.9 (*)    AST 78 (*)    ALT 154 (*)    Alkaline Phosphatase 209 (*)  All other components within normal limits  CBC WITH DIFFERENTIAL/PLATELET - Abnormal; Notable for the following components:   WBC 16.1 (*)    Platelets 36 (*)    Neutro Abs 13.3 (*)    Monocytes Absolute 1.7 (*)    All other components within normal limits  URINALYSIS, ROUTINE W REFLEX MICROSCOPIC - Abnormal; Notable for the following components:   APPearance TURBID (*)    Hgb urine dipstick LARGE (*)    Protein, ur 30 (*)    Leukocytes, UA LARGE (*)    All other components within normal limits  URINALYSIS, MICROSCOPIC (REFLEX) - Abnormal; Notable for the following components:   Bacteria, UA MANY (*)    All other components within normal limits  I-STAT CG4 LACTIC ACID, ED - Abnormal; Notable for the following components:   Lactic Acid, Venous 2.07 (*)    All other components within normal limits  CULTURE, BLOOD (ROUTINE X 2)  CULTURE, BLOOD (ROUTINE X 2)  URINE CULTURE  SAVE SMEAR  DIC (DISSEMINATED INTRAVASCULAR COAGULATION) PANEL  I-STAT CG4 LACTIC ACID, ED   ____________________________________________  EKG   EKG Interpretation  Date/Time:  Thursday May 31 2018 08:17:28 EDT Ventricular Rate:  94 PR Interval:    QRS Duration: 89 QT Interval:  361 QTC Calculation: 452 R  Axis:   -43 Text Interpretation:  Sinus rhythm Borderline short PR interval Probable left ventricular hypertrophy No significant change since last tracing Confirmed by Merrily Pew [phone]) on 05/31/2018 11:32:58 AM       ____________________________________________  RADIOLOGY  No results found.  ____________________________________________   PROCEDURES  Procedure(s) performed:   Procedures  CRITICAL CARE Performed by: Merrily Pew Total critical care time: 35 minutes Critical care time was exclusive of separately billable procedures and treating other patients. Critical care was necessary to treat or prevent imminent or life-threatening deterioration. Critical care was time spent personally by me on the following activities: development of treatment plan with patient and/or surrogate as well as nursing, discussions with consultants, evaluation of patient's response to treatment, examination of patient, obtaining history from patient or surrogate, ordering and performing treatments and interventions, ordering and review of laboratory studies, ordering and review of radiographic studies, pulse oximetry and re-evaluation of patient's condition.  ____________________________________________   INITIAL IMPRESSION / ASSESSMENT AND PLAN / ED COURSE  Eval for sepsis possible UTI.  A bit tachycardic so we will give fluids as well.  Oral temperature normal will check a rectal. Rectal temperature elevated, this is a accordance with her tachycardia and suspected urinary tract infection will start Rocephin for possible sepsis. Thick slightly elevated but repeat improved with just a liter of fluids.  Not severe sepsis or septic shock at this time.  He had antibiotics but will not pursue the 30 cc kilogram.  Found to have low platelets of 38 which is new for her.  White blood cell count elevated and a little bit elevation in BUN and creatinine.  Discussed the case with Dr.Feng who recommends at  least observation if not admission the hospital secondary to her thrombocytopenia and infection.  Patient appears well will discuss with hospitalist regarding admission.   Pertinent labs & imaging results that were available during my care of the patient were reviewed by me and considered in my medical decision making (see chart for details).  ____________________________________________  FINAL CLINICAL IMPRESSION(S) / ED DIAGNOSES  Final diagnoses:  Urinary tract infection without hematuria, site unspecified  Thrombocytopenia (HCC)    MEDICATIONS GIVEN DURING THIS  VISIT:  Medications  lactated ringers bolus 1,000 mL (0 mLs Intravenous Stopped 05/31/18 1001)  lidocaine-prilocaine (EMLA) cream ( Topical Given 05/31/18 0823)  cefTRIAXone (ROCEPHIN) 1 g in sodium chloride 0.9 % 100 mL IVPB ( Intravenous Stopped 05/31/18 1027)  acetaminophen (TYLENOL) tablet 650 mg (650 mg Oral Given 05/31/18 0948)     NEW OUTPATIENT MEDICATIONS STARTED DURING THIS VISIT:  New Prescriptions   No medications on file    Note:  This note was prepared with assistance of Dragon voice recognition software. Occasional wrong-word or sound-a-like substitutions may have occurred due to the inherent limitations of voice recognition software.   Merrily Pew, MD 05/31/18 1135

## 2018-05-31 NOTE — H&P (Addendum)
History and Physical    [name] Riley JOI:[id number] DOB: Oct 22, 1950 DOA: 05/31/2018  PCP: [name] Housekeeper, Riley   Patient coming from: Home  Chief Complaint: Weakness, Shakes, and Chills  HPI: [name] Riley is a 67 y.o. 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  Patient states that she is having urinary frequency for last 2 days but no burning or discomfort.  She states that her urine was very dark and cloudy and states that it smelled like "metal."  She woke up this morning and felt very weak and had body shaking and chills.  Husband states that she felt warm did not check her temperature.  She denies chest pain, nausea, vomiting, lightheadedness, dizziness or any other options or concerns however she did feel extremely weak and tired.  Denies any back pain. Further work-up revealed that she had a urinary tract infection she was admitted for IV fluid hydration and treatment of her UTI.  Incidentally she was also found to have thrombocytopenia and hematology was consulted for further evaluation of her low platelet count.  TRH was called to admit this patient for a UTI.  ED Course: Patient was given a liter of lactated Ringer's bolus, IV ceftriaxone, as well as acetaminophen and had basic blood work done.  Urinalysis was done and urine culture along with blood cultures were sent in the ED  Review of Systems: As per HPI otherwise 10 point review of systems negative.   Past Medical History:  Diagnosis Date  . Allergy   . Breast cancer ([place]) 08/25/2011   L , invasive ductal carcinoma, ER/PR +,HER2 -  . Cancer [facility])    left breast cancer  . Coronary artery disease 2001  . Heart attack [facility]) 09/2000   Sep 25, 2000  --no intervention  . History of chemotherapy comp. 08/22/2012   4 cycles of FEC and $ cycles of Taxotere  . Hyperlipidemia   . Hypertension   . Hypothyroidism   . PONV (postoperative nausea and vomiting)    gets sick from anesthesia  . Status post radiation therapy 07/09/12 - 08/22/2012   Left Breast, 60.4 gray   Past Surgical History:  Procedure Laterality Date  . ABDOMINAL HYSTERECTOMY  1998   TAH, oophorectomy  . APPENDECTOMY  1970  . BREAST SURGERY  1998   removal of benign lump in rt breast  . BREAST SURGERY  11/14/11   right simple mastectomy, left mrm  . INCISION AND DRAINAGE OF WOUND Left 07/01/2017   Procedure: IRRIGATION AND DEBRIDEMENT CHEST WALL ABSCESS;  Surgeon: [name] Mesa, Riley;  Location: [facility];  Service: General;  Laterality: Left;  Marland Kitchen MASS EXCISION Left 06/22/2017   Procedure: EXCISION OF CHEST WALL MASSES;  Surgeon: [name] Mesa, Riley;  Location: [place];  Service: General;  Laterality: Left;  Marland Kitchen MASTECTOMY Bilateral    for left breast cancer  . OVARIAN CYST SURGERY Right 1970  . PORT-A-CATH REMOVAL  08/30/2012   Procedure: REMOVAL PORT-A-CATH;  Surgeon: [name] Burn. [name], Riley;  Location: [place];  Service: General;  Laterality: Right;  port removal  . PORTACATH PLACEMENT  11/14/2011   Procedure: INSERTION PORT-A-CATH;  Surgeon: [name] Burn. [name], Riley;  Location: [place];  Service: General;  Laterality: Right;  .  PORTACATH PLACEMENT N/A 04/25/2016   Procedure: INSERTION PORT-A-CATH LEFT CHEST;  Surgeon: [name] Mesa, Riley;  Location: [place];  Service: General;  Laterality: N/A;  . skin tags  05/09/1997   left axillary left neck skin tags  . TONSILLECTOMY  1968   SOCIAL HISTORY  reports that she has never smoked. She has never used smokeless tobacco. She reports that she does not drink alcohol or use drugs.  Allergies  Allergen Reactions  . Cantaloupe Extract Allergy Skin Test Shortness Of Breath  . Contrast Media [Iodinated  Diagnostic Agents] Shortness Of Breath and Rash  . Pravastatin Other (See Comments)    Legs hurt  . Zosyn [Piperacillin Sod-Tazobactam So] Rash and Other (See Comments)    Temperature increase, facial flushing   Family History  Problem Relation Age of Onset  . Hypertension Maternal Grandmother   . Diabetes Maternal Grandmother   . Cancer Father 80       lung cancer and Prostate Cancer  . Hypertension Mother   . Cancer Paternal Aunt        ovarian  . Cancer Cousin        breast, paternal cousin  . Cancer Paternal Uncle        stomach  . Cancer Paternal Grandfather        Esophagus  . Colon cancer Neg Hx    Prior to Admission medications   Medication Sig Start Date End Date Taking? Authorizing Provider  acetaminophen (TYLENOL) 500 MG tablet Take 1,000 mg by mouth every 6 (six) hours as needed for moderate pain.   Yes [name] Riley  aspirin 81 MG tablet Take 81 mg by mouth daily.     Yes [name] Riley  cholecalciferol (VITAMIN D) 1000 units tablet Take 1 tablet (1,000 Units total) by mouth daily. 04/13/18  Yes [name] Lose, Riley  diphenhydrAMINE (BENADRYL) 25 MG tablet Take 50 mg by mouth at bedtime as needed for sleep.   Yes [name] Riley  levETIRAcetam (KEPPRA) 500 MG tablet Take 1 tablet (500 mg total) by mouth 2 (two) times daily. 05/11/18  Yes Mesner, Corene Cornea, Riley  levothyroxine (SYNTHROID, LEVOTHROID) 88 MCG tablet Take 88 mcg by mouth daily before breakfast. BRAND ONLY 02/01/18  Yes [name] Riley  lidocaine-prilocaine (EMLA) cream Apply topically as needed (for port access). Apply to affected area once 02/15/18  Yes [name] Lose, Riley  metoprolol succinate (TOPROL-XL) 100 MG 24 hr tablet Take 1 tablet (100 mg total) by mouth daily. 09/22/17  Yes [name] Lose, Riley  Multiple Vitamins-Minerals (MULTIVITAMIN WITH MINERALS) tablet Take 1 tablet by mouth daily.     Yes [name] Riley  ondansetron (ZOFRAN-ODT) 4 MG disintegrating tablet Take 1  tablet (4 mg total) by mouth every 6 (six) hours as needed. 03/02/18  Yes [name] Lose, Riley  triamcinolone ointment (KENALOG) 0.5 % Apply 1 application topically 2 (two) times daily. Patient not taking: Reported on 05/31/2018 05/04/18   Gardenia Phlegm, NP   Physical Exam: Vitals:   05/31/18 1230 05/31/18 1300 05/31/18 1400 05/31/18 1559  BP: 119/75 121/75 137/79   Pulse: 77 77 84   Resp: 15 20 18    Temp:   98.6 F (37 C) 99.6 F (37.6 C)  TempSrc:   Oral Axillary  SpO2: 97% 97% 99%   Weight:      Height:       Constitutional: WN/WD thin Caucasian female in NAD and appears calm and comfortable Eyes:  Lids and conjunctivae normal,  sclerae anicteric  ENMT: External Ears, Nose appear normal. Grossly normal hearing. Mucous membranes are moist.  Neck: Appears normal, supple, no cervical masses, normal ROM, no appreciable thyromegaly, no JVD Respiratory: Diminished to auscultation bilaterally, no wheezing, rales, rhonchi or crackles. Normal respiratory effort and patient is not tachypenic. No accessory muscle use.  Cardiovascular: Slightly Tachycardic but Regular Rhythm, no murmurs / rubs / gallops. S1 and S2 auscultated. No extremity edema.  Abdomen: Soft, non-tender, non-distended. No masses palpated. No appreciable hepatosplenomegaly. Bowel sounds positive x4.  GU: Deferred. Musculoskeletal: No clubbing / cyanosis of digits/nails. Good ROM, no contractures. Normal strength and muscle tone.  Skin: Has some bruising noted in LE on Shins and some petechiae on arms. No induration; Warm and dry.  Neurologic: CN 2-12 grossly intact with no focal deficits. Romberg sign and cerebellar reflexes not assessed.  Psychiatric: Normal judgment and insight. Alert and oriented x 3. Normal mood and appropriate affect.   Labs on Admission: I have personally reviewed following labs and imaging studies  CBC: Recent Labs  Lab 05/25/18 0805 05/31/18 0843 05/31/18 1444  WBC 11.6* 16.1*  --     NEUTROABS 9.6* 13.3*  --   HGB 13.1 12.5  --   HCT 39.3 37.2  --   MCV 91.0 90.7  --   PLT 206 36* 34*   Basic Metabolic Panel: Recent Labs  Lab 05/25/18 0805 05/31/18 0843  NA 135 135  K 3.6 3.2*  CL 100 100  CO2 24 25  GLUCOSE 172* 134*  BUN 19 27*  CREATININE 0.72 0.90  CALCIUM 8.2* 8.1*  MG  --  2.1  PHOS  --  2.6   GFR: Estimated Creatinine Clearance: 62.9 mL/min (by C-G formula based on SCr of 0.9 mg/dL). Liver Function Tests: Recent Labs  Lab 05/25/18 0805 05/31/18 0843  AST 47* 78*  ALT 85* 154*  ALKPHOS 158* 209*  BILITOT 0.6 1.1  PROT 6.7 6.2*  ALBUMIN 3.5 2.9*   No results for input(s): LIPASE, AMYLASE in the last 168 hours. No results for input(s): AMMONIA in the last 168 hours. Coagulation Profile: Recent Labs  Lab 05/31/18 1444  INR 1.12   Cardiac Enzymes: No results for input(s): CKTOTAL, CKMB, CKMBINDEX, TROPONINI in the last 168 hours. BNP (last 3 results) No results for input(s): PROBNP in the last 8760 hours. HbA1C: No results for input(s): HGBA1C in the last 72 hours. CBG: No results for input(s): GLUCAP in the last 168 hours. Lipid Profile: No results for input(s): CHOL, HDL, LDLCALC, TRIG, CHOLHDL, LDLDIRECT in the last 72 hours. Thyroid Function Tests: Recent Labs    05/31/18 0843  TSH 1.222   Anemia Panel: No results for input(s): VITAMINB12, FOLATE, FERRITIN, TIBC, IRON, RETICCTPCT in the last 72 hours. Urine analysis:    Component Value Date/Time   COLORURINE YELLOW 05/31/2018 0843   APPEARANCEUR TURBID (A) 05/31/2018 0843   LABSPEC 1.010 05/31/2018 0843   PHURINE 6.0 05/31/2018 0843   GLUCOSEU NEGATIVE 05/31/2018 0843   HGBUR LARGE (A) 05/31/2018 0843   BILIRUBINUR NEGATIVE 05/31/2018 0843   KETONESUR NEGATIVE 05/31/2018 0843   PROTEINUR 30 (A) 05/31/2018 0843   NITRITE NEGATIVE 05/31/2018 0843   LEUKOCYTESUR LARGE (A) 05/31/2018 0843   Sepsis Labs:  !!!!!!!!!!!!!!!!!!!!!!!!!!!!!!!!!!!!!!!!!!!! @LABRCNTIP (procalcitonin:4,lacticidven:4) )No results found for this or any previous visit (from the past 240 hour(s)).  Radiological Exams on Admission: No results found.  EKG: Independently reviewed. Showed a Sinus Rhythm at a rate of 94 with No evidence of any ST  elevation on my interpretation.  Assessment/Plan Active Problems:   Hypothyroidism   Essential hypertension   Breast cancer of upper-outer quadrant of left female breast ([place])   Hypertension   Metastasis to bone ([facility])   Metastatic breast cancer ([facility])   Elevated LFTs   UTI (urinary tract infection)   Sepsis secondary to UTI ([facility])   Thrombocytopenia ([facility])   Hypokalemia  Sepsis 2/2 to UTI -Admit to Med-Surge -Sepsis Physiology improving after IVF Hydration -Check Blood Cx x2 and Urine Cx -Given 1 Liter of Fluid in ED and started on Maintenance IVF with NS at 75 mL/hr x 1 day -Follow cultures and sensitivities and de-escalate antibiotics accordingly -Repeat CBC in the a.m.  Thrombocytopenia -In the Setting of IV Chemotherapy Kadcyla -Check DIC Panel and Peripheral Smear -Platelet Count is now down to 36,000 -Continue to Monitor for S/Sx of Bleeding -Repeat CBC in AM  Hyp Metastatic Breast Cancer with Mets to the Brain and Bone -Sees Dr. Jacqualine Mau in outpatient setting -Had a dural based metastasis overlying the right frontotemporal convexity and associated vasogenic edema -Recently had chemotherapy with Kadcyla -Continue with Keppra 500 mg BID  Leukocytosis -Likely in the setting of infection and recent Decadron use -Continue to treat urinary tract infection -Continue to monitor for signs and symptoms of infection -Repeat CBC in the a.m.  Hypokalemia -Patient's potassium this morning was 3.2 -Replete with p.o. potassium 40 mg twice daily x2 doses -Continue monitor and replete as necessary -Repeat CMP in the a.m.  Hypothyroidism -Check TSH -Continue with  levothyroxine 88 mcg p.o. Daily  Abnormal LFTs/Elevated LFTs -Likely in the setting of IV chemotherapy with Kadcyla -Continue to monitor and trend and obtain the right upper quadrant ultrasound if continues to trend upwards -Repeat CMP in a.m.  CAD/HTN -Continue with aspirin 81 mg p.o. Daily and Metoprolol Succinate 100 mg p.o. daily  DVT prophylaxis: SCDs given Thromobocytopenia Code Status: FULL CODE Family Communication: Disucssed with Husband at bedside Disposition Plan: Anticipate D/C Home in next 24-48 hours if medically stable  Consults called: Hematology Dr. Burr Medico Admission status: Inpatient Med-Surge  Severity of Illness: The appropriate patient status for this patient is INPATIENT. Inpatient status is judged to be reasonable and necessary in order to provide the required intensity of service to ensure the patient's safety. The patient's presenting symptoms, physical exam findings, and initial radiographic and laboratory data in the context of their chronic comorbidities is felt to place them at high risk for further clinical deterioration. Furthermore, it is not anticipated that the patient will be medically stable for discharge from the hospital within 2 midnights of admission. The following factors support the patient status of inpatient.   " The patient's presenting symptoms include Chills and Urinary Frequency. " The worrisome physical exam findings include Fever and brusing. " The initial radiographic and laboratory data are worrisome because of UTI and Thrombocytopenia. " The chronic co-morbidities include HTN, HLD, CAD, Metastatic Breast Cancer.  * I certify that at the point of admission it is my clinical judgment that the patient will require inpatient hospital care spanning beyond 2 midnights from the point of admission due to high intensity of service, high risk for further deterioration and high frequency of surveillance required.Kerney Elbe, D.O. [facility] Pager [phone]  If 7PM-7AM, please contact night-coverage [email or web address] Password [facility]  05/31/2018, 4:51 PM

## 2018-05-31 NOTE — Progress Notes (Signed)
Called for report, RN did not come to phone, will try again.

## 2018-06-01 ENCOUNTER — Other Ambulatory Visit: Payer: Self-pay | Admitting: Adult Health

## 2018-06-01 DIAGNOSIS — B9620 Unspecified Escherichia coli [E. coli] as the cause of diseases classified elsewhere: Secondary | ICD-10-CM

## 2018-06-01 DIAGNOSIS — Z170 Estrogen receptor positive status [ER+]: Secondary | ICD-10-CM

## 2018-06-01 DIAGNOSIS — A4151 Sepsis due to Escherichia coli [E. coli]: Principal | ICD-10-CM

## 2018-06-01 DIAGNOSIS — C50412 Malignant neoplasm of upper-outer quadrant of left female breast: Secondary | ICD-10-CM

## 2018-06-01 LAB — BLOOD CULTURE ID PANEL (REFLEXED)

## 2018-06-01 LAB — COMPREHENSIVE METABOLIC PANEL
ALT: 116 U/L — ABNORMAL HIGH (ref 0–44)
AST: 55 U/L — ABNORMAL HIGH (ref 15–41)
Albumin: 2.6 g/dL — ABNORMAL LOW (ref 3.5–5.0)
Alkaline Phosphatase: 221 U/L — ABNORMAL HIGH (ref 38–126)
Anion gap: 9 (ref 5–15)
BUN: 14 mg/dL (ref 8–23)
CO2: 25 mmol/L (ref 22–32)
Calcium: 7.6 mg/dL — ABNORMAL LOW (ref 8.9–10.3)
Chloride: 103 mmol/L (ref 98–111)
Creatinine, Ser: 0.67 mg/dL (ref 0.44–1.00)
GFR calc Af Amer: >60 mL/min (ref >60)
GFR calc non Af Amer: >60 mL/min (ref >60)
Glucose, Bld: 87 mg/dL (ref 70–99)
Potassium: 4.1 mmol/L (ref 3.5–5.1)
Sodium: 137 mmol/L (ref 135–145)
Total Bilirubin: 0.8 mg/dL (ref 0.3–1.2)
Total Protein: 6.3 g/dL — ABNORMAL LOW (ref 6.5–8.1)

## 2018-06-01 LAB — CBC
HCT: 37.4 % (ref 36.0–46.0)
Hemoglobin: 12.3 g/dL (ref 12.0–15.0)
MCH: 30.1 pg (ref 26.0–34.0)
MCHC: 32.9 g/dL (ref 30.0–36.0)
MCV: 91.7 fL (ref 78.0–100.0)
Platelets: 34 10*3/uL — ABNORMAL LOW (ref 150–400)
RBC: 4.08 MIL/uL (ref 3.87–5.11)
RDW: 15 % (ref 11.5–15.5)
WBC: 12.5 10*3/uL — ABNORMAL HIGH (ref 4.0–10.5)

## 2018-06-01 LAB — GLUCOSE, CAPILLARY: Glucose-Capillary: 78 mg/dL (ref 70–99)

## 2018-06-01 LAB — MRSA PCR SCREENING: MRSA by PCR: NEGATIVE

## 2018-06-01 MED ORDER — ADULT MULTIVITAMIN W/MINERALS CH
1.0000 | ORAL_TABLET | Freq: Every day | ORAL | Status: DC
  Administered 2018-06-01 – 2018-06-03 (×3): 1 via ORAL
  Filled 2018-06-01 (×3): qty 1

## 2018-06-01 MED ORDER — CEFTRIAXONE SODIUM 2 G IJ SOLR
2.0000 g | Freq: Every day | INTRAMUSCULAR | Status: DC
Start: 2018-06-01 — End: 2018-06-03
  Administered 2018-06-01 – 2018-06-02 (×3): 2 g via INTRAVENOUS
  Filled 2018-06-01: qty 2
  Filled 2018-06-01: qty 20
  Filled 2018-06-01: qty 2

## 2018-06-01 MED ORDER — SODIUM CHLORIDE 0.9 % IV SOLN
INTRAVENOUS | Status: DC
  Administered 2018-06-01 – 2018-06-02 (×2): via INTRAVENOUS

## 2018-06-01 MED ORDER — PREDNISONE 50 MG PO TABS: ORAL_TABLET | ORAL | 0 refills | Status: DC

## 2018-06-01 NOTE — Progress Notes (Signed)
[name] Riley   DOB:June 27, 1951   NL#:[id number]   EYC#:[id number]  Oncology f/u   Subjective: Pt had persistent fever today, T-max 102.8, she took Tylenol and felt better.  Both her urine and blood culture came back positive for E. coli.  Sensitivities still pending.  She does feel better overall today.  Objective:  Vitals:   06/01/18 1354 06/01/18 1615  BP: (!) 156/77 (!) 141/79  Pulse: 94 91  Resp: 18 18  Temp: (!) 102.8 F (39.3 C) 98.9 F (37.2 C)  SpO2: 96% 95%    Body mass index is 22.64 kg/m.  Intake/Output Summary (Last 24 hours) at 06/01/2018 1757 Last data filed at 06/01/2018 1629 Gross per 24 hour  Intake 1262.73 ml  Output -  Net 1262.73 ml     Sclerae unicteric  Oropharynx clear  No peripheral adenopathy  Lungs clear -- no rales or rhonchi  Heart regular rate and rhythm  Abdomen benign  MSK no focal spinal tenderness, no peripheral edema  Neuro nonfocal   CBG (last 3)  Recent Labs    06/01/18 0754  GLUCAP 78     Labs:  Lab Results  Component Value Date   WBC 12.5 (H) 06/01/2018   HGB 12.3 06/01/2018   HCT 37.4 06/01/2018   MCV 91.7 06/01/2018   PLT 34 (L) 06/01/2018   NEUTROABS 13.3 (H) 05/31/2018   CMP Latest Ref Rng & Units 06/01/2018 05/31/2018 05/25/2018  Glucose 70 - 99 mg/dL 87 134(H) 172(H)  BUN 8 - 23 mg/dL 14 27(H) 19  Creatinine 0.44 - 1.00 mg/dL 0.67 0.90 0.72  Sodium 135 - 145 mmol/L 137 135 135  Potassium 3.5 - 5.1 mmol/L 4.1 3.2(L) 3.6  Chloride 98 - 111 mmol/L 103 100 100  CO2 22 - 32 mmol/L 25 25 24   Calcium 8.9 - 10.3 mg/dL 7.6(L) 8.1(L) 8.2(L)  Total Protein 6.5 - 8.1 g/dL 6.3(L) 6.2(L) 6.7  Total Bilirubin 0.3 - 1.2 mg/dL 0.8 1.1 0.6  Alkaline Phos 38 - 126 U/L 221(H) 209(H) 158(H)  AST 15 - 41 U/L 55(H) 78(H) 47(H)  ALT 0 - 44 U/L 116(H) 154(H) 85(H)    Urine Studies No results for input(s): UHGB, CRYS in the last 72 hours.  Invalid input(s): UACOL, UAPR, USPG, UPH, UTP, UGL, UKET, UBIL, UNIT, UROB, ULEU, UEPI, UWBC,  URBC, UBAC, CAST, UCOM, BILUA  Basic Metabolic Panel: Recent Labs  Lab 05/31/18 0843 06/01/18 0558  NA 135 137  K 3.2* 4.1  CL 100 103  CO2 25 25  GLUCOSE 134* 87  BUN 27* 14  CREATININE 0.90 0.67  CALCIUM 8.1* 7.6*  MG 2.1  --   PHOS 2.6  --    GFR Estimated Creatinine Clearance: 70.7 mL/min (by C-G formula based on SCr of 0.67 mg/dL). Liver Function Tests: Recent Labs  Lab 05/31/18 0843 06/01/18 0558  AST 78* 55*  ALT 154* 116*  ALKPHOS 209* 221*  BILITOT 1.1 0.8  PROT 6.2* 6.3*  ALBUMIN 2.9* 2.6*   No results for input(s): LIPASE, AMYLASE in the last 168 hours. No results for input(s): AMMONIA in the last 168 hours. Coagulation profile Recent Labs  Lab 05/31/18 1444  INR 1.12    CBC: Recent Labs  Lab 05/31/18 0843 05/31/18 1444 06/01/18 0558  WBC 16.1*  --  12.5*  NEUTROABS 13.3*  --   --   HGB 12.5  --  12.3  HCT 37.2  --  37.4  MCV 90.7  --  91.7  PLT  36* 34* 34*   Cardiac Enzymes: No results for input(s): CKTOTAL, CKMB, CKMBINDEX, TROPONINI in the last 168 hours. BNP: Invalid input(s): POCBNP CBG: Recent Labs  Lab 06/01/18 0754  GLUCAP 78   D-Dimer Recent Labs    05/31/18 1444  DDIMER 1.94*   Hgb A1c No results for input(s): HGBA1C in the last 72 hours. Lipid Profile No results for input(s): CHOL, HDL, LDLCALC, TRIG, CHOLHDL, LDLDIRECT in the last 72 hours. Thyroid function studies Recent Labs    05/31/18 0843  TSH 1.222   Anemia work up No results for input(s): VITAMINB12, FOLATE, FERRITIN, TIBC, IRON, RETICCTPCT in the last 72 hours. Microbiology Recent Results (from the past 240 hour(s))  Blood Culture (routine x 2)     Status: None (Preliminary result)   Collection Time: 05/31/18  8:43 AM  Result Value Ref Range Status   Specimen Description   Final    BLOOD RIGHT WRIST Performed at [place] [address] [place], [place] [address]    Special Requests   Final    BOTTLES DRAWN AEROBIC AND  ANAEROBIC Blood Culture adequate volume Performed at [place] [address] [place], [place] [address]    Culture  Setup Time   Final    GRAM NEGATIVE RODS IN BOTH AEROBIC AND ANAEROBIC BOTTLES CRITICAL RESULT CALLED TO, READ BACK BY AND VERIFIED WITH: Lavell Luster PHARMD [id number] 06/01/18 A BROWNING    Culture   Final    GRAM NEGATIVE RODS CULTURE REINCUBATED FOR BETTER GROWTH Performed at [facility], [place] [address], [place], [place] [address]    Report Status PENDING  Incomplete  Urine culture     Status: Abnormal (Preliminary result)   Collection Time: 05/31/18  8:43 AM  Result Value Ref Range Status   Specimen Description   Final    URINE, CLEAN CATCH Performed at [facility], [place] [address], [place], [place] [address]    Special Requests   Final    NONE Performed at [facility], [place] [address] [place], [place] [address]    Culture >=100,000 COLONIES/mL ESCHERICHIA COLI (A)  Final   Report Status PENDING  Incomplete  Blood Culture (routine x 2)     Status: Abnormal (Preliminary result)   Collection Time: 05/31/18  9:40 AM  Result Value Ref Range Status   Specimen Description   Final    BLOOD PORTA CATH Performed at [name] [address], [place], [place] [address]    Special Requests   Final    BOTTLES DRAWN AEROBIC AND ANAEROBIC Blood Culture adequate volume Performed at [place] [address], [place], [place] [address]    Culture  Setup Time   Final    IN BOTH AEROBIC AND ANAEROBIC BOTTLES GRAM NEGATIVE RODS CRITICAL RESULT CALLED TO, READ BACK BY AND VERIFIED WITH: Lavell Luster PHARMD [id number] 06/01/18 A BROWNING    Culture (A)  Final    ESCHERICHIA COLI CULTURE REINCUBATED FOR BETTER GROWTH Performed at [facility], [place] [address] [place], [place] [address]    Report Status PENDING  Incomplete  Blood Culture ID Panel (Reflexed)     Status:  Abnormal   Collection Time: 05/31/18  9:40 AM  Result Value Ref Range Status   Enterococcus species NOT DETECTED NOT DETECTED Final   Listeria monocytogenes NOT DETECTED NOT DETECTED Final   Staphylococcus species NOT DETECTED NOT DETECTED Final   Staphylococcus  aureus NOT DETECTED NOT DETECTED Final   Streptococcus species NOT DETECTED NOT DETECTED Final   Streptococcus agalactiae NOT DETECTED NOT DETECTED Final   Streptococcus pneumoniae NOT DETECTED NOT DETECTED Final   Streptococcus pyogenes NOT DETECTED NOT DETECTED Final   Acinetobacter baumannii NOT DETECTED NOT DETECTED Final   Enterobacteriaceae species DETECTED (A) NOT DETECTED Final    Comment: Enterobacteriaceae represent a large family of gram-negative bacteria, not a single organism. CRITICAL RESULT CALLED TO, READ BACK BY AND VERIFIED WITH: Lavell Luster PHARMD [id number] 06/01/18 A BROWNING    Enterobacter cloacae complex NOT DETECTED NOT DETECTED Final   Escherichia coli DETECTED (A) NOT DETECTED Final    Comment: CRITICAL RESULT CALLED TO, READ BACK BY AND VERIFIED WITH: Lavell Luster PHARMD [id number] 06/01/18 A BROWNING    Klebsiella oxytoca NOT DETECTED NOT DETECTED Final   Klebsiella pneumoniae NOT DETECTED NOT DETECTED Final   Proteus species NOT DETECTED NOT DETECTED Final   Serratia marcescens NOT DETECTED NOT DETECTED Final   Carbapenem resistance NOT DETECTED NOT DETECTED Final   Haemophilus influenzae NOT DETECTED NOT DETECTED Final   Neisseria meningitidis NOT DETECTED NOT DETECTED Final   Pseudomonas aeruginosa NOT DETECTED NOT DETECTED Final   Candida albicans NOT DETECTED NOT DETECTED Final   Candida glabrata NOT DETECTED NOT DETECTED Final   Candida krusei NOT DETECTED NOT DETECTED Final   Candida parapsilosis NOT DETECTED NOT DETECTED Final   Candida tropicalis NOT DETECTED NOT DETECTED Final    Comment: Performed at [facility], [place] [address], [place], [place] [address]  MRSA PCR Screening     Status: None    Collection Time: 05/31/18  9:22 PM  Result Value Ref Range Status   MRSA by PCR NEGATIVE NEGATIVE Final    Comment:        The GeneXpert MRSA Assay (FDA approved for NASAL specimens only), is one component of a comprehensive MRSA colonization surveillance program. It is not intended to diagnose MRSA infection nor to guide or monitor treatment for MRSA infections. Performed at [facility], [place] [address], [place], [place] [address]       Studies:  No results found.  Assessment: 67 y.o. with metastatic breast cancer, currently on Kadcyla, presented with fever, chills, dysuria and weakness  1.  E Coli UTI and bacteriemia  2.  sepsis 3.  Thrombocytopenia, likely secondary to chemo and sepsis  4.  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 Anemia  6.  Leukocytosis, secondary to infection 7.  Transaminitis, possible secondary to chemo  Plan:  -She is on ceftriaxone for UTI.  Due to her persistent high fever today, I am concerned about the ESBL E. Coli, which may be resistant to ceftriaxone.  Her cultures sensitivity is not back.  I spoke with hospitalist Dr. Marthenia Rolling and he would like to wait for the culture sensitivity before changing antibiotics. He believes fever can persistent even if antibiotics is working.  -her DIC panel showed high fibrinogen, I have low suspicion this is DIC, she has no clinical signs of bleeding.  Her platelet counts is persistently low, but overall stable, will continue watching.  Her thrombocytopenia is likely the combination of chemo and sepsis. -No need for blood transfusion at this point. -She is scheduled for CT scan on Monday, she is unlikely going home this weekend, we will need to reschedule her CT scan.  She had allergy reaction to CT contrast in the past, my nurse  practitioner has called in prednisone as premedication for her CT scan. -we will f/u, please call us if needed.  -Dr.  Lindi Adie will return on Monday and f/u her.    Truitt Merle, MD 06/01/2018  5:57 PM

## 2018-06-01 NOTE — Progress Notes (Signed)
PHARMACY - PHYSICIAN COMMUNICATION CRITICAL VALUE ALERT - BLOOD CULTURE IDENTIFICATION (BCID)  [name] Riley is an 67 y.o. female who presented to [facility] on 05/31/2018 with a chief complaint of UTI  Assessment:  Patient with bacteremia.   (include suspected source if known)  Name of physician (or Provider) Contacted: C. Bodenheimer  Current antibiotics: Ceftriaxone 1gm iv q24hr  Changes to prescribed antibiotics recommended:  Recommendations accepted by provider  Change to ceftriaxone 2gm iv q24hr  Results for orders placed or performed during the hospital encounter of 05/31/18  Blood Culture ID Panel (Reflexed) (Collected: 05/31/2018  9:40 AM)  Result Value Ref Range   Enterococcus species NOT DETECTED NOT DETECTED   Listeria monocytogenes NOT DETECTED NOT DETECTED   Staphylococcus species NOT DETECTED NOT DETECTED   Staphylococcus aureus NOT DETECTED NOT DETECTED   Streptococcus species NOT DETECTED NOT DETECTED   Streptococcus agalactiae NOT DETECTED NOT DETECTED   Streptococcus pneumoniae NOT DETECTED NOT DETECTED   Streptococcus pyogenes NOT DETECTED NOT DETECTED   Acinetobacter baumannii NOT DETECTED NOT DETECTED   Enterobacteriaceae species DETECTED (A) NOT DETECTED   Enterobacter cloacae complex NOT DETECTED NOT DETECTED   Escherichia coli DETECTED (A) NOT DETECTED   Klebsiella oxytoca NOT DETECTED NOT DETECTED   Klebsiella pneumoniae NOT DETECTED NOT DETECTED   Proteus species NOT DETECTED NOT DETECTED   Serratia marcescens NOT DETECTED NOT DETECTED   Carbapenem resistance NOT DETECTED NOT DETECTED   Haemophilus influenzae NOT DETECTED NOT DETECTED   Neisseria meningitidis NOT DETECTED NOT DETECTED   Pseudomonas aeruginosa NOT DETECTED NOT DETECTED   Candida albicans NOT DETECTED NOT DETECTED   Candida glabrata NOT DETECTED NOT DETECTED   Candida krusei NOT DETECTED NOT DETECTED   Candida parapsilosis NOT DETECTED NOT DETECTED   Candida tropicalis NOT DETECTED  NOT DETECTED    Nani Skillern Crowford 06/01/2018  12:30 AM

## 2018-06-01 NOTE — Progress Notes (Signed)
OT Cancellation Note  Patient Details Name: [name] Riley MRN: [id number] DOB: 1951/08/24   Cancelled Treatment:    Reason Eval/Treat Not Completed: Other (comment). Deferred per nursing (per PT). Will try to check back this weekend.  Deano Tomaszewski 06/01/2018, 12:42 PM  Lesle Chris, OTR/L [phone] 06/01/2018

## 2018-06-01 NOTE — Progress Notes (Signed)
PT Cancellation Note  Patient Details Name: [name] Riley MRN: [id number] DOB: 1951/03/31   Cancelled Treatment:    Reason Eval/Treat Not Completed: Other (comment);Fatigue/lethargy limiting ability to participate(deferred by nursing) - Per nursing, pt did not sleep well last night and needs to rest at this time. Will check back as schedule permits.   Julien Girt, PT, DPT  Pager # [phone]     [name] [name] 06/01/2018, 12:08 PM

## 2018-06-01 NOTE — Evaluation (Signed)
Physical Therapy Evaluation Patient Details Name: [name] Riley MRN: [id number] DOB: 1951-10-07 Today's Date: 06/01/2018   History of Present Illness  67 YO female admitted for UTI. PMH includes breast cancer diagnosed in 2012 and metastatic breast cancer 2018 with chemo/radiation, CAD, MI in 2001, HTN, HLD. Notable surgical history includes R mastectomy.   Clinical Impression  Pt is a 67 YO female admitted for UTI. Pt with PMH above, notably pt is diagnosed with metastatic breast cancer. Pt presents with increased time to perform bed mobility, decreased tolerance for ambulation, decreased LE strength, and unsteadiness noted in pt not being able to tolerate challenge to sitting/standing balance and reaching for environment when walking to and from bathroom in room. Pt would benefit from acute PT to address these deficits. Pt with period of tachycardia reaching 153 with sitting EOB during muscle testing. PT session stopped, pt returned to bed, and nursing notified. Recommending OPPT to further address balance and gait impairments. Will continue to follow acutely.     Follow Up Recommendations Supervision for mobility/OOB;Outpatient PT    Equipment Recommendations  None recommended by PT    Recommendations for Other Services       Precautions / Restrictions Precautions Precautions: None Restrictions Weight Bearing Restrictions: No      Mobility  Bed Mobility Overal bed mobility: Needs Assistance Bed Mobility: Supine to Sit     Supine to sit: Supervision     General bed mobility comments: supervision for safety, increased time to perform bed mobility   Transfers Overall transfer level: Needs assistance Equipment used: None Transfers: Sit to/from Stand Sit to Stand: Supervision         General transfer comment: Supervision for safety, increased time to stand. Did not want to accept help for standing from PT.   Ambulation/Gait Ambulation/Gait assistance: Supervision Gait  Distance (Feet): 25 Feet(to bathroom and back) Assistive device: None(reached for environment for support, did not want to use RW) Gait Pattern/deviations: Step-through pattern;Decreased stride length;Trunk flexed Gait velocity: decreased   General Gait Details: decreased gait speed, reaching for environment for steadying  Stairs            Wheelchair Mobility    Modified Rankin (Stroke Patients Only)       Balance Overall balance assessment: Needs assistance Sitting-balance support: Feet supported Sitting balance-Leahy Scale: Fair     Standing balance support: No upper extremity supported Standing balance-Leahy Scale: Fair Standing balance comment: does not tolerate weight shifting and forward progression well, reaches for environment for steadying                              Pertinent Vitals/Pain Pain Assessment: Faces Faces Pain Scale: Hurts little more Pain Location: abdomen  Pain Descriptors / Indicators: Grimacing;Guarding Pain Intervention(s): Limited activity within patient's tolerance;Repositioned;Monitored during session    Home Living Family/patient expects to be discharged to:: Private residence Living Arrangements: Spouse/significant other Available Help at Discharge: Family;Available 24 hours/day Type of Home: House Home Access: Stairs to enter Entrance Stairs-Rails: None Entrance Stairs-Number of Steps: 2 Home Layout: One level Home Equipment: None      Prior Function Level of Independence: Independent               Hand Dominance        Extremity/Trunk Assessment   Upper Extremity Assessment Upper Extremity Assessment: Overall WFL for tasks assessed    Lower Extremity Assessment Lower Extremity Assessment: Generalized weakness(unable to complete  MMT due to pt reaching HR of 151 at EOB)    Cervical / Trunk Assessment Cervical / Trunk Assessment: Kyphotic  Communication   Communication: No difficulties  Cognition  Arousal/Alertness: Awake/alert Behavior During Therapy: WFL for tasks assessed/performed Overall Cognitive Status: Within Functional Limits for tasks assessed                                        General Comments      Exercises     Assessment/Plan    PT Assessment Patient needs continued PT services  PT Problem List Decreased activity tolerance;Decreased strength;Decreased balance;Pain       PT Treatment Interventions Therapeutic activities;Gait training;Therapeutic exercise;Patient/family education;Stair training;Functional mobility training;Balance training    PT Goals (Current goals can be found in the Care Plan section)  Acute Rehab PT Goals PT Goal Formulation: With patient Time For Goal Achievement: 06/15/18 Potential to Achieve Goals: Good    Frequency Min 3X/week   Barriers to discharge        Co-evaluation               AM-PAC PT "6 Clicks" Daily Activity  Outcome Measure Difficulty turning over in bed (including adjusting bedclothes, sheets and blankets)?: A Little Difficulty moving from lying on back to sitting on the side of the bed? : A Little Difficulty sitting down on and standing up from a chair with arms (e.g., wheelchair, bedside commode, etc,.)?: A Lot Help needed moving to and from a bed to chair (including a wheelchair)?: A Little Help needed walking in hospital room?: A Lot Help needed climbing 3-5 steps with a railing? : A Lot 6 Click Score: 15    End of Session Equipment Utilized During Treatment: Gait belt Activity Tolerance: Treatment limited secondary to medical complications (Comment);Patient limited by fatigue(HR reaching 153 upon sitting EOB ) Patient left: in bed;with call bell/phone within reach(with MD in room ) Nurse Communication: Mobility status PT Visit Diagnosis: Other abnormalities of gait and mobility (R26.89);Difficulty in walking, not elsewhere classified (R26.2);Unsteadiness on feet (R26.81)     Time: 6599-3570 PT Time Calculation (min) (ACUTE ONLY): 22 min   Charges:   PT Evaluation $PT Eval Low Complexity: 1 Low         Fredrica Capano Conception Chancy, PT, DPT  Pager # [phone]   Rosemae Mcquown D Jamariya Davidoff 06/01/2018, 6:22 PM

## 2018-06-02 DIAGNOSIS — C50919 Malignant neoplasm of unspecified site of unspecified female breast: Secondary | ICD-10-CM

## 2018-06-02 DIAGNOSIS — E038 Other specified hypothyroidism: Secondary | ICD-10-CM

## 2018-06-02 DIAGNOSIS — I10 Essential (primary) hypertension: Secondary | ICD-10-CM

## 2018-06-02 LAB — COMPREHENSIVE METABOLIC PANEL
ALT: 90 U/L — ABNORMAL HIGH (ref 0–44)
AST: 46 U/L — ABNORMAL HIGH (ref 15–41)
Albumin: 2.6 g/dL — ABNORMAL LOW (ref 3.5–5.0)
Alkaline Phosphatase: 248 U/L — ABNORMAL HIGH (ref 38–126)
Anion gap: 9 (ref 5–15)
BUN: 13 mg/dL (ref 8–23)
CO2: 24 mmol/L (ref 22–32)
Calcium: 7.6 mg/dL — ABNORMAL LOW (ref 8.9–10.3)
Chloride: 100 mmol/L (ref 98–111)
Creatinine, Ser: 0.53 mg/dL (ref 0.44–1.00)
GFR calc Af Amer: >60 mL/min (ref >60)
GFR calc non Af Amer: >60 mL/min (ref >60)
Glucose, Bld: 78 mg/dL (ref 70–99)
Potassium: 3.6 mmol/L (ref 3.5–5.1)
Sodium: 133 mmol/L — ABNORMAL LOW (ref 135–145)
Total Bilirubin: 0.9 mg/dL (ref 0.3–1.2)
Total Protein: 6.3 g/dL — ABNORMAL LOW (ref 6.5–8.1)

## 2018-06-02 LAB — CBC WITH DIFFERENTIAL/PLATELET
Basophils Absolute: 0 10*3/uL (ref 0.0–0.1)
Basophils Relative: 0 %
Eosinophils Absolute: 0.1 10*3/uL (ref 0.0–0.7)
Eosinophils Relative: 1 %
HCT: 36.6 % (ref 36.0–46.0)
Hemoglobin: 12.1 g/dL (ref 12.0–15.0)
Lymphocytes Relative: 17 %
Lymphs Abs: 1.3 10*3/uL (ref 0.7–4.0)
MCH: 30.1 pg (ref 26.0–34.0)
MCHC: 33.1 g/dL (ref 30.0–36.0)
MCV: 91 fL (ref 78.0–100.0)
Monocytes Absolute: 0.6 10*3/uL (ref 0.1–1.0)
Monocytes Relative: 8 %
Neutro Abs: 5.5 10*3/uL (ref 1.7–7.7)
Neutrophils Relative %: 74 %
Platelets: 44 10*3/uL — ABNORMAL LOW (ref 150–400)
RBC: 4.02 MIL/uL (ref 3.87–5.11)
RDW: 14.9 % (ref 11.5–15.5)
WBC: 7.5 10*3/uL (ref 4.0–10.5)

## 2018-06-02 LAB — MAGNESIUM: Magnesium: 2 mg/dL (ref 1.7–2.4)

## 2018-06-02 LAB — GLUCOSE, CAPILLARY: Glucose-Capillary: 76 mg/dL (ref 70–99)

## 2018-06-02 NOTE — Evaluation (Signed)
Occupational Therapy One Time Evaluation Patient Details Name: [name] Riley MRN: [id number] DOB: 10-17-1951 Today's Date: 06/02/2018    History of Present Illness 67 YO female admitted for UTI. PMH includes breast cancer diagnosed in 2012 and metastatic breast cancer 2018 with chemo/radiation, CAD, MI in 2001, HTN, HLD. Notable surgical history includes R mastectomy.    Clinical Impression   Pt doing well with self care tasks. HR maintained at 99 before and after activity. Pt overall at supervision level. Discussed how to progress activity and safety techniques. Do not feel pt needs further skilled OT. Will sign off. Discussed walking with nursing for more activity.    Follow Up Recommendations  No OT follow up;Supervision - Intermittent    Equipment Recommendations  None recommended by OT    Recommendations for Other Services       Precautions / Restrictions Precautions Precautions: None      Mobility Bed Mobility Overal bed mobility: Needs Assistance Bed Mobility: Supine to Sit;Sit to Supine     Supine to sit: Supervision;HOB elevated Sit to supine: Supervision;HOB elevated   General bed mobility comments: supervision for safety  Transfers Overall transfer level: Needs assistance Equipment used: None Transfers: Sit to/from Stand Sit to Stand: Supervision         General transfer comment: supervison for safety     Balance                                           ADL either performed or assessed with clinical judgement   ADL Overall ADL's : Needs assistance/impaired Eating/Feeding: Independent;Sitting   Grooming: Supervision/safety;Standing   Upper Body Bathing: Set up;Sitting   Lower Body Bathing: Supervison/ safety;Sit to/from stand   Upper Body Dressing : Set up;Sitting   Lower Body Dressing: Supervision/safety;Sit to/from stand   Toilet Transfer: Supervision/safety;Ambulation;Comfort height toilet   Toileting- Clothing  Manipulation and Hygiene: Supervision/safety;Sit to/from stand   Tub/ Shower Transfer: Walk-in shower;Min guard(simulate step over rebath tub)     General ADL Comments: Pt states she feels so much better today. Moves somewhat slowly but did not have to hold onto furniture with functional mobility. No assistive device used. Pt states she wasnt fatigued after session. HR remained at 99 throughout session. Educated on sitting down for her shower initially at home to help with safety and activity progression. Husband present for session and verbalized understanding of all education too. Pt able to push up from commode without holding onto grab bar.      Vision Patient Visual Report: No change from baseline       Perception     Praxis      Pertinent Vitals/Pain Pain Assessment: No/denies pain     Hand Dominance     Extremity/Trunk Assessment Upper Extremity Assessment Upper Extremity Assessment: Overall WFL for tasks assessed           Communication Communication Communication: No difficulties   Cognition Arousal/Alertness: Awake/alert Behavior During Therapy: WFL for tasks assessed/performed Overall Cognitive Status: Within Functional Limits for tasks assessed                                     General Comments       Exercises     Shoulder Instructions      Home Living Family/patient expects to  be discharged to:: Private residence Living Arrangements: Spouse/significant other Available Help at Discharge: Family;Available 24 hours/day Type of Home: House Home Access: Stairs to enter [employer] of Steps: 2 Entrance Stairs-Rails: None Home Layout: One level     Bathroom Shower/Tub: Tub/shower unit(rebath with seat and small step over)   Bathroom Toilet: Handicapped height     Home Equipment: Shower seat - built in          Prior Functioning/Environment Level of Independence: Independent                 OT Problem List:         OT Treatment/Interventions:      OT Goals(Current goals can be found in the care plan section) Acute Rehab OT Goals Patient Stated Goal: home  OT Goal Formulation: With patient  OT Frequency:     Barriers to D/C:            Co-evaluation              AM-PAC PT "6 Clicks" Daily Activity     Outcome Measure Help from another person eating meals?: None Help from another person taking care of personal grooming?: A Little Help from another person toileting, which includes using toliet, bedpan, or urinal?: A Little Help from another person bathing (including washing, rinsing, drying)?: A Little Help from another person to put on and taking off regular upper body clothing?: None Help from another person to put on and taking off regular lower body clothing?: A Little 6 Click Score: 20   End of Session    Activity Tolerance: Patient tolerated treatment well Patient left: in bed;with call bell/phone within reach  OT Visit Diagnosis: Muscle weakness (generalized) (M62.81)                Time: 1445-1500 OT Time Calculation (min): 15 min Charges:  OT General Charges $OT Visit: 1 Visit OT Evaluation $OT Eval Low Complexity: 1 Low    Bryahna Lesko S Alletta Mattos 06/02/2018, 3:15 PM

## 2018-06-02 NOTE — Progress Notes (Signed)
[facility]                                                                              Patient Demographics  [name] Riley, is a 67 y.o. female, DOB - 02/25/1951, ZRA:[id number]  Admit date - 05/31/2018   Admitting Physician Kerney Elbe, DO  Outpatient Primary MD for the patient is [name], MD  Outpatient specialists:   LOS - 2  days   Medical records reviewed and are as summarized below:    Chief Complaint  Patient presents with  . Weakness  . 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  Work-up done so far has revealed UTI/sepsis secondary to E. coli.   Assessment & Plan    Principal problem  E. coli bacteremia secondary to E. coli UTI -Continue Rocephin 2 g IV daily, follow blood cultures and sensitivities -Leukocytosis improving however still spiking temp, 101.3 F today -On urine culture 8/15, pansensitive   Thrombocytopenia -In the setting of IV chemotherapy Kadcyla, metastatic breast cancer, sepsis -Continue antibiotics for E. coli urosepsis, -Platelets improving, oncology following   Metastatic breast cancer with mets to bone and brain -Followed by Dr. Sonny Dandy, last treated with kadcyla on 05/25/2018 -Oncology following -Continue Keppra for seizure prophylaxis.  Patient had a dural based metastasis overlying the right frontotemporal convexity and vasogenic edema  Hypothyroidism -Continue levothyroxine   Elevated LFTs -In the setting of IV chemotherapy with kadcyla and urosepsis -Continue to follow the trend, may need right upper quadrant ultrasound if  worsening  Hypertension, CAD -Continue aspirin, beta-blocker   Code Status: Full CODE STATUS DVT Prophylaxis: SCD's Family Communication: Discussed in detail with the patient, all imaging results, lab results explained to the patient    Disposition Plan: When medically ready  Time Spent in minutes   25 minutes  Procedures:  None  Consultants:   Oncology  Antimicrobials:      Medications  Scheduled Meds: . aspirin EC  81 mg Oral Daily  . cholecalciferol  1,000 Units Oral Daily  . docusate sodium  100 mg Oral BID  . levETIRAcetam  500 mg Oral BID  . levothyroxine  88 mcg Oral QAC breakfast  . metoprolol succinate  100 mg Oral Daily  . multivitamin with minerals  1 tablet Oral Daily   Continuous Infusions: . sodium chloride Stopped (06/01/18 2226)  . cefTRIAXone (ROCEPHIN)  IV 2 g (06/01/18 2226)   PRN Meds:.acetaminophen, diphenhydrAMINE, lidocaine-prilocaine, ondansetron **OR** ondansetron (ZOFRAN) IV, senna-docusate   Antibiotics   Anti-infectives (From admission, onward)   Start     Dose/Rate Route Frequency Ordered Stop   06/01/18 1000  cefTRIAXone (ROCEPHIN) 1 g in sodium chloride 0.9 % 100 mL IVPB  Status:  Discontinued     1 g 200 mL/hr over 30 Minutes Intravenous Every 24 hours 05/31/18  1912 06/01/18 0032   06/01/18 0045  cefTRIAXone (ROCEPHIN) 2 g in sodium chloride 0.9 % 100 mL IVPB     2 g 200 mL/hr over 30 Minutes Intravenous Daily at bedtime 06/01/18 0033     05/31/18 0915  cefTRIAXone (ROCEPHIN) 1 g in sodium chloride 0.9 % 100 mL IVPB     1 g 200 mL/hr over 30 Minutes Intravenous  Once 05/31/18 0914 05/31/18 1027        Subjective:   [name] Riley was seen and examined today.  Still spiking temp 101.3 F this morning.  No chest pain or shortness of breath.  No nausea vomiting, abdominal pain.  Objective:   Vitals:   06/01/18 2205 06/02/18 0437 06/02/18 0729 06/02/18 0855  BP: (!) 141/80 134/76    Pulse:  80    Resp:  18    Temp:  99.8 F (37.7 C) 99 F (37.2 C) (!) 101.3 F (38.5 C) 99.3 F (37.4 C)  TempSrc: Oral Oral    SpO2:  100%    Weight:  67.8 kg    Height:        Intake/Output Summary (Last 24 hours) at 06/02/2018 1254 Last data filed at 06/01/2018 2226 Gross per 24 hour  Intake 857 ml  Output -  Net 857 ml     Wt Readings from Last 3 Encounters:  06/02/18 67.8 kg  05/21/18 68.1 kg  05/11/18 68 kg     Exam  General: Alert and oriented x 3, NAD, ill-appearing  Eyes: P  HEENT:  Atraumatic, normocephalic  Cardiovascular: S1 S2 auscultated,  Regular rate and rhythm.  Respiratory: Decreased breath sound at the bases  Gastrointestinal: Soft, nontender, nondistended, + bowel sounds  Ext: no pedal edema bilaterally  Neuro: no new deficits  Musculoskeletal: No digital cyanosis, clubbing  Skin: No rashes  Psych: Normal affect and demeanor, alert and oriented x3    Data Reviewed:  I have personally reviewed following labs and imaging studies  Micro Results Recent Results (from the past 240 hour(s))  Blood Culture (routine x 2)     Status: None (Preliminary result)   Collection Time: 05/31/18  8:43 AM  Result Value Ref Range Status   Specimen Description   Final    BLOOD RIGHT WRIST Performed at [facility] [address], [place], [place] [address]    Special Requests   Final    BOTTLES DRAWN AEROBIC AND ANAEROBIC Blood Culture adequate volume Performed at [facility] [address], [place], [place] [address]    Culture  Setup Time   Final    GRAM NEGATIVE RODS IN BOTH AEROBIC AND ANAEROBIC BOTTLES CRITICAL RESULT CALLED TO, READ BACK BY AND VERIFIED WITH: Lavell Luster PHARMD [id number] 06/01/18 A BROWNING    Culture   Final    GRAM NEGATIVE RODS CULTURE REINCUBATED FOR BETTER GROWTH Performed at [facility], [place] [address] [place], [place] [address]    Report Status PENDING  Incomplete  Urine culture     Status: Abnormal  (Preliminary result)   Collection Time: 05/31/18  8:43 AM  Result Value Ref Range Status   Specimen Description   Final    URINE, CLEAN CATCH Performed at [facility], [place] [address], [place], [place] [address]    Special Requests   Final    NONE Performed at [facility], [place] [address] [place], [place] [address]    Culture (A)  Final    >=  100,000 COLONIES/mL ESCHERICHIA COLI CULTURE REINCUBATED FOR BETTER GROWTH Performed at [facility], [place] [address], [place], [place] [address]    Report Status PENDING  Incomplete   Organism ID, Bacteria ESCHERICHIA COLI (A)  Final      Susceptibility   Escherichia coli - MIC*    AMPICILLIN <=2 SENSITIVE Sensitive     CEFAZOLIN <=4 SENSITIVE Sensitive     CEFTRIAXONE <=1 SENSITIVE Sensitive     CIPROFLOXACIN <=0.25 SENSITIVE Sensitive     GENTAMICIN <=1 SENSITIVE Sensitive     IMIPENEM <=0.25 SENSITIVE Sensitive     NITROFURANTOIN <=16 SENSITIVE Sensitive     TRIMETH/SULFA <=20 SENSITIVE Sensitive     AMPICILLIN/SULBACTAM <=2 SENSITIVE Sensitive     PIP/TAZO <=4 SENSITIVE Sensitive     Extended ESBL NEGATIVE Sensitive     * >=100,000 COLONIES/mL ESCHERICHIA COLI  Blood Culture (routine x 2)     Status: Abnormal (Preliminary result)   Collection Time: 05/31/18  9:40 AM  Result Value Ref Range Status   Specimen Description   Final    BLOOD PORTA CATH Performed at [facility] [address], [place], [place] [address]    Special Requests   Final    BOTTLES DRAWN AEROBIC AND ANAEROBIC Blood Culture adequate volume Performed at [facility] [address], [place], [place] [address]    Culture  Setup Time   Final    IN BOTH AEROBIC AND ANAEROBIC BOTTLES GRAM NEGATIVE RODS CRITICAL RESULT CALLED TO, READ BACK BY AND VERIFIED WITH: Lavell Luster PHARMD [id number] 06/01/18 A BROWNING    Culture (A)  Final    ESCHERICHIA COLI SUSCEPTIBILITIES TO FOLLOW Performed  at [facility], [place] [address] [place], [place] [address]    Report Status PENDING  Incomplete  Blood Culture ID Panel (Reflexed)     Status: Abnormal   Collection Time: 05/31/18  9:40 AM  Result Value Ref Range Status   Enterococcus species NOT DETECTED NOT DETECTED Final   Listeria monocytogenes NOT DETECTED NOT DETECTED Final   Staphylococcus species NOT DETECTED NOT DETECTED Final   Staphylococcus aureus NOT DETECTED NOT DETECTED Final   Streptococcus species NOT DETECTED NOT DETECTED Final   Streptococcus agalactiae NOT DETECTED NOT DETECTED Final   Streptococcus pneumoniae NOT DETECTED NOT DETECTED Final   Streptococcus pyogenes NOT DETECTED NOT DETECTED Final   Acinetobacter baumannii NOT DETECTED NOT DETECTED Final   Enterobacteriaceae species DETECTED (A) NOT DETECTED Final    Comment: Enterobacteriaceae represent a large family of gram-negative bacteria, not a single organism. CRITICAL RESULT CALLED TO, READ BACK BY AND VERIFIED WITH: Lavell Luster PHARMD [id number] 06/01/18 A BROWNING    Enterobacter cloacae complex NOT DETECTED NOT DETECTED Final   Escherichia coli DETECTED (A) NOT DETECTED Final    Comment: CRITICAL RESULT CALLED TO, READ BACK BY AND VERIFIED WITH: Lavell Luster PHARMD [id number] 06/01/18 A BROWNING    Klebsiella oxytoca NOT DETECTED NOT DETECTED Final   Klebsiella pneumoniae NOT DETECTED NOT DETECTED Final   Proteus species NOT DETECTED NOT DETECTED Final   Serratia marcescens NOT DETECTED NOT DETECTED Final   Carbapenem resistance NOT DETECTED NOT DETECTED Final   Haemophilus influenzae NOT DETECTED NOT DETECTED Final   Neisseria meningitidis NOT DETECTED NOT DETECTED Final   Pseudomonas aeruginosa NOT DETECTED NOT DETECTED Final   Candida albicans NOT DETECTED NOT DETECTED Final   Candida glabrata NOT DETECTED NOT DETECTED Final   Candida krusei NOT DETECTED NOT DETECTED  Final   Candida parapsilosis NOT DETECTED NOT DETECTED Final   Candida tropicalis NOT  DETECTED NOT DETECTED Final    Comment: Performed at [facility], [place] [address], [place], [place] [address]  MRSA PCR Screening     Status: None   Collection Time: 05/31/18  9:22 PM  Result Value Ref Range Status   MRSA by PCR NEGATIVE NEGATIVE Final    Comment:        The GeneXpert MRSA Assay (FDA approved for NASAL specimens only), is one component of a comprehensive MRSA colonization surveillance program. It is not intended to diagnose MRSA infection nor to guide or monitor treatment for MRSA infections. Performed at [facility], [place] [address] [place], [place] [address]     Radiology Reports Dg Chest 2 View  Result Date: 05/11/2018 CLINICAL DATA:  LEFT arm numbness and tachycardia today. Patient with metastatic breast cancer. EXAM: CHEST - 2 VIEW COMPARISON:  02/05/2018 CT and bone scans. 04/26/2016 and prior chest radiographs. FINDINGS: The cardiomediastinal silhouette is unremarkable. A LEFT subclavian Port-A-Cath is present with tip overlying the LOWER SVC. There is no evidence of focal airspace disease, pulmonary edema, suspicious pulmonary nodule/mass, pleural effusion, or pneumothorax. Diffuse sclerotic bony metastases are again noted. IMPRESSION: No evidence of acute cardiopulmonary disease. Diffuse sclerotic bony metastases again noted. Electronically Signed   By: Margarette Canada M.D.   On: 05/11/2018 15:00   Ct Head Wo Contrast  Result Date: 05/11/2018 CLINICAL DATA:  Left arm numbness. Rapid heart rate. Stage IV breast cancer. EXAM: CT HEAD WITHOUT CONTRAST TECHNIQUE: Contiguous axial images were obtained from the base of the skull through the vertex without intravenous contrast. COMPARISON:  None. FINDINGS: Brain: No subdural, epidural, or subarachnoid hemorrhage. Low-attenuation in the white matter of the right frontal lobe superiorly is most consistent with edema. A discrete mass is not seen. The overlying cortex is intact. Cerebellum, brainstem,  and basal cisterns are normal. Ventricles and sulci are unremarkable. No midline shift. No acute cortical ischemia identified. Vascular: No hyperdense vessel or unexpected calcification. Skull: Sclerosis in the calvarium, particularly on the right, consistent with bony metastatic disease. Sinuses/Orbits: No acute finding. Other: None. IMPRESSION: 1. Findings are most consistent with edema in the right frontal white matter. Given history of malignancy, I am suspicious for an underlying mass as the cause for edema. Recommend an MRI with and without contrast for further evaluation. Findings called to Dr. Dayna Barker. Electronically Signed   By: Dorise Bullion III M.D   On: 05/11/2018 17:20   Mr Jodene Nam Head Wo Contrast  Result Date: 05/11/2018 CLINICAL DATA:  Initial evaluation for numbness in left arm. History of metastatic breast cancer. EXAM: MRI HEAD WITHOUT AND WITH CONTRAST MRA HEAD WITHOUT CONTRAST TECHNIQUE: Multiplanar, multiecho pulse sequences of the brain and surrounding structures were obtained without and with intravenous contrast. Angiographic images of the head were obtained using MRA technique without contrast. CONTRAST:  60m MULTIHANCE GADOBENATE DIMEGLUMINE 529 MG/ML IV SOLN COMPARISON:  Comparison made with prior head CT from earlier same day as well as prior bone scan from 02/05/2018 FINDINGS: MRI HEAD FINDINGS Brain: Cerebral volume within normal limits. No significant cerebral white matter disease for age. No evidence for acute or subacute infarct. No encephalomalacia to suggest chronic infarction no evidence for acute or chronic intracranial hemorrhage. There is abnormal nodular dural thickening and enhancement overlying the right frontoparietal convexity near the vertex, concerning for dural base metastasis. Nodular area measures approximately 5.1 cm in  length (series 21, image 7). Tumor measures 12 mm in maximal thickness (series 19, image 10). Adjacent dura overlying the right cerebral convexity  is diffusely thickened with abnormal enhancement (series 19, image 12). Associated vasogenic edema within the underlying high right frontoparietal region without significant midline shift (series 14, image 39). No other evidence for intracranial metastatic disease. No other mass lesion or mass effect. No midline shift. No hydrocephalus. No extra-axial fluid collection. Pituitary gland within normal limits. Vascular: Major intracranial vascular flow voids are maintained Skull and upper cervical spine: Craniocervical junction within normal limits. Decreased T1/T2 hypointensity seen throughout the bone marrow of the upper cervical spine, clivus, with scattered involvement of the calvaria, most consistent with osseous metastases. No scalp soft tissue abnormality. Sinuses/Orbits: Globes and orbital soft tissues within normal limits. Mild scattered mucosal thickening throughout the ethmoidal air cells and maxillary sinuses. Left maxillary sinus retention cyst noted. No mastoid effusion. Inner ear structures normal. Other: None. MRA HEAD FINDINGS ANTERIOR CIRCULATION: Visualized distal cervical segments of the internal carotid arteries are patent with antegrade flow. Petrous, cavernous, and supraclinoid segments patent without hemodynamically significant stenosis. Apparent defect at the anterior genu of the cavernous ICAs bilaterally favored to be artifactual. Origin of the ophthalmic arteries patent bilaterally. ICA termini widely patent. A1 segments, anterior communicating artery, and anterior cerebral arteries widely patent. M1 segments patent without stenosis. Distal MCA branches well perfused and symmetric. POSTERIOR CIRCULATION: Vertebral arteries widely patent to the vertebrobasilar junction without stenosis. Left vertebral artery dominant. Right PICA patent. Left PICA not visualized. Basilar widely patent to its distal aspect. Superior cerebellar arteries patent bilaterally. Right PCA supplied via the basilar.  Fetal type origin of the left PCA. PCAs widely patent to their distal aspects. No intracranial aneurysm. IMPRESSION: MRI HEAD IMPRESSION: 1. Dural-based metastasis overlying the right frontoparietal convexity as above. Associated vasogenic edema within the underlying right cerebral hemisphere without significant midline shift. 2. Signal abnormality throughout the visualized bone marrow, compatible with osseous metastatic disease. 3. No other acute intracranial abnormality. No evidence for acute infarct. MRA HEAD IMPRESSION: Normal intracranial MRA. Electronically Signed   By: Jeannine Boga M.D.   On: 05/11/2018 19:39   Mr Jeri Cos And Wo Contrast  Result Date: 05/11/2018 CLINICAL DATA:  Initial evaluation for numbness in left arm. History of metastatic breast cancer. EXAM: MRI HEAD WITHOUT AND WITH CONTRAST MRA HEAD WITHOUT CONTRAST TECHNIQUE: Multiplanar, multiecho pulse sequences of the brain and surrounding structures were obtained without and with intravenous contrast. Angiographic images of the head were obtained using MRA technique without contrast. CONTRAST:  1m MULTIHANCE GADOBENATE DIMEGLUMINE 529 MG/ML IV SOLN COMPARISON:  Comparison made with prior head CT from earlier same day as well as prior bone scan from 02/05/2018 FINDINGS: MRI HEAD FINDINGS Brain: Cerebral volume within normal limits. No significant cerebral white matter disease for age. No evidence for acute or subacute infarct. No encephalomalacia to suggest chronic infarction no evidence for acute or chronic intracranial hemorrhage. There is abnormal nodular dural thickening and enhancement overlying the right frontoparietal convexity near the vertex, concerning for dural base metastasis. Nodular area measures approximately 5.1 cm in length (series 21, image 7). Tumor measures 12 mm in maximal thickness (series 19, image 10). Adjacent dura overlying the right cerebral convexity is diffusely thickened with abnormal enhancement (series  19, image 12). Associated vasogenic edema within the underlying high right frontoparietal region without significant midline shift (series 14, image 39). No other evidence for intracranial metastatic disease. No other mass lesion  or mass effect. No midline shift. No hydrocephalus. No extra-axial fluid collection. Pituitary gland within normal limits. Vascular: Major intracranial vascular flow voids are maintained Skull and upper cervical spine: Craniocervical junction within normal limits. Decreased T1/T2 hypointensity seen throughout the bone marrow of the upper cervical spine, clivus, with scattered involvement of the calvaria, most consistent with osseous metastases. No scalp soft tissue abnormality. Sinuses/Orbits: Globes and orbital soft tissues within normal limits. Mild scattered mucosal thickening throughout the ethmoidal air cells and maxillary sinuses. Left maxillary sinus retention cyst noted. No mastoid effusion. Inner ear structures normal. Other: None. MRA HEAD FINDINGS ANTERIOR CIRCULATION: Visualized distal cervical segments of the internal carotid arteries are patent with antegrade flow. Petrous, cavernous, and supraclinoid segments patent without hemodynamically significant stenosis. Apparent defect at the anterior genu of the cavernous ICAs bilaterally favored to be artifactual. Origin of the ophthalmic arteries patent bilaterally. ICA termini widely patent. A1 segments, anterior communicating artery, and anterior cerebral arteries widely patent. M1 segments patent without stenosis. Distal MCA branches well perfused and symmetric. POSTERIOR CIRCULATION: Vertebral arteries widely patent to the vertebrobasilar junction without stenosis. Left vertebral artery dominant. Right PICA patent. Left PICA not visualized. Basilar widely patent to its distal aspect. Superior cerebellar arteries patent bilaterally. Right PCA supplied via the basilar. Fetal type origin of the left PCA. PCAs widely patent to  their distal aspects. No intracranial aneurysm. IMPRESSION: MRI HEAD IMPRESSION: 1. Dural-based metastasis overlying the right frontoparietal convexity as above. Associated vasogenic edema within the underlying right cerebral hemisphere without significant midline shift. 2. Signal abnormality throughout the visualized bone marrow, compatible with osseous metastatic disease. 3. No other acute intracranial abnormality. No evidence for acute infarct. MRA HEAD IMPRESSION: Normal intracranial MRA. Electronically Signed   By: Jeannine Boga M.D.   On: 05/11/2018 19:39    Lab Data:  CBC: Recent Labs  Lab 05/31/18 0843 05/31/18 1444 06/01/18 0558 06/02/18 0620  WBC 16.1*  --  12.5* 7.5  NEUTROABS 13.3*  --   --  5.5  HGB 12.5  --  12.3 12.1  HCT 37.2  --  37.4 36.6  MCV 90.7  --  91.7 91.0  PLT 36* 34* 34* 44*   Basic Metabolic Panel: Recent Labs  Lab 05/31/18 0843 06/01/18 0558 06/02/18 0620  NA 135 137 133*  K 3.2* 4.1 3.6  CL 100 103 100  CO2 25 25 24   GLUCOSE 134* 87 78  BUN 27* 14 13  CREATININE 0.90 0.67 0.53  CALCIUM 8.1* 7.6* 7.6*  MG 2.1  --  2.0  PHOS 2.6  --   --    GFR: Estimated Creatinine Clearance: 70.7 mL/min (by C-G formula based on SCr of 0.53 mg/dL). Liver Function Tests: Recent Labs  Lab 05/31/18 0843 06/01/18 0558 06/02/18 0620  AST 78* 55* 46*  ALT 154* 116* 90*  ALKPHOS 209* 221* 248*  BILITOT 1.1 0.8 0.9  PROT 6.2* 6.3* 6.3*  ALBUMIN 2.9* 2.6* 2.6*   No results for input(s): LIPASE, AMYLASE in the last 168 hours. No results for input(s): AMMONIA in the last 168 hours. Coagulation Profile: Recent Labs  Lab 05/31/18 1444  INR 1.12   Cardiac Enzymes: No results for input(s): CKTOTAL, CKMB, CKMBINDEX, TROPONINI in the last 168 hours. BNP (last 3 results) No results for input(s): PROBNP in the last 8760 hours. HbA1C: No results for input(s): HGBA1C in the last 72 hours. CBG: Recent Labs  Lab 06/01/18 0754 06/02/18 0739  GLUCAP 78  76   Lipid  Profile: No results for input(s): CHOL, HDL, LDLCALC, TRIG, CHOLHDL, LDLDIRECT in the last 72 hours. Thyroid Function Tests: Recent Labs    05/31/18 0843  TSH 1.222   Anemia Panel: No results for input(s): VITAMINB12, FOLATE, FERRITIN, TIBC, IRON, RETICCTPCT in the last 72 hours. Urine analysis:    Component Value Date/Time   COLORURINE YELLOW 05/31/2018 0843   APPEARANCEUR TURBID (A) 05/31/2018 0843   LABSPEC 1.010 05/31/2018 0843   PHURINE 6.0 05/31/2018 0843   GLUCOSEU NEGATIVE 05/31/2018 0843   HGBUR LARGE (A) 05/31/2018 0843   BILIRUBINUR NEGATIVE 05/31/2018 [place] 05/31/2018 0843   PROTEINUR 30 (A) 05/31/2018 0843   NITRITE NEGATIVE 05/31/2018 0843   LEUKOCYTESUR LARGE (A) 05/31/2018 0843     Puja Caffey M.D. [facility] 06/02/2018, 12:54 PM  Pager: [phone] Between 7am to 7pm - call Pager - [phone]  After 7pm go to [email or web address] - password [email or web address]  Call night coverage person covering after 7pm

## 2018-06-02 NOTE — Progress Notes (Addendum)
IP PROGRESS NOTE  Subjective:   [name] Riley is treated by Dr. Lindi Adie for metastatic breast cancer.  She was last treated with kadcycla 05/25/2018.  She was admitted with urinary frequency and chills on 05/31/2018.  A urinalysis was consistent with a urinary tract infection. She reports feeling better.  She had a nosebleed yesterday.  Objective: Vital signs in last 24 hours: Blood pressure 134/76, pulse 80, temperature 99.3 F (37.4 C), resp. rate 18, height 5\' 8"  (1.727 m), weight 149 lb 7.6 oz (67.8 kg), SpO2 100 %.  Intake/Output from previous day: 08/16 0701 - 08/17 0700 In: 1324.9 [P.O.:240; I.V.:1084.9] Out: -   Physical Exam:  HEENT: Oropharynx without thrush or bleeding Lungs: Inspiratory rhonchi at the lower posterior chest bilaterally, no respiratory distress Cardiac: Regular rate and rhythm Abdomen: No hepatosplenomegaly, nontender Extremities: No leg edema   Portacath/PICC-without erythema  Lab Results: Recent Labs    06/01/18 0558 06/02/18 0620  WBC 12.5* 7.5  HGB 12.3 12.1  HCT 37.4 36.6  PLT 34* 44*    BMET Recent Labs    06/01/18 0558 06/02/18 0620  NA 137 133*  K 4.1 3.6  CL 103 100  CO2 25 24  GLUCOSE 87 78  BUN 14 13  CREATININE 0.67 0.53  CALCIUM 7.6* 7.6*    No results found for: CEA1  Studies/Results: No results found.  Medications: I have reviewed the patient's current medications.  Assessment/Plan:  1.  Urosepsis- urine/blood culture positive for E. coli, pansensitive on the urine culture 15 2019 2.  Metastatic breast cancer, status post kadcycla 05/25/2018 3.  Thrombocytopenia secondary to metastatic breast cancer, chemotherapy, and sepsis-stable 4.   Persistent fever secondary to #1 5.   History of coronary artery disease 6.   Elevated liver enzymes   Recommendations: 1.  Continue antibiotics for the E. coli urosepsis 2.  Please call Oncology as needed, Dr. Lindi Adie will return 06/04/2018   LOS: 2 days   Betsy Coder, MD    06/02/2018, 9:36 AM

## 2018-06-02 NOTE — Progress Notes (Signed)
PROGRESS NOTE    [name] Riley  MKL:[id number] DOB: Oct 24, 1950 DOA: 05/31/2018 PCP: [name], MD  Outpatient Specialists:    Brief Narrative:   [name] Riley is a 67 y.o. 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  Work-up done so far has revealed UTI/sepsis secondary to E. coli.  Final cultures are still pending.  Patient is on IV Rocephin 2 g daily.  Patient reports feeling a lot better today, despite having had fever of 102.8 Fahrenheit.  We will continue current antibiotics for now.  Will follow final urine and blood cultures.     Assessment & Plan:   Active Problems:   Hypothyroidism   Essential hypertension   Breast cancer of upper-outer quadrant of left female breast ([place])   Hypertension   Metastasis to bone (HCC)   Metastatic breast cancer (HCC)   Elevated LFTs   UTI (urinary tract infection)   Sepsis secondary to UTI (HCC)   Thrombocytopenia (HCC)   Hypokalemia   Sepsis 2/2 to UTI -Admit to Med-Surge -Sepsis Physiology improving after IVF Hydration -Check Blood Cx x2 and Urine Cx -Given 1 Liter of Fluid in ED and started on Maintenance IVF with NS at 75 mL/hr x 1 day -Follow cultures and sensitivities and de-escalate antibiotics accordingly -Repeat CBC in the a.m.  06/01/2018: Follow final urine cultures. Continue IV ceftriaxone for now. Decrease IV fluids to 50 mils per hour. Patient reported significant improvement.  Thrombocytopenia -In the Setting of IV Chemotherapy Kadcyla -Check DIC Panel and Peripheral Smear -Platelet Count is now down to 36,000 -Continue to Monitor for S/Sx of Bleeding -Repeat CBC in AM   06/01/2018: Continue to monitor platelet  counts. Fibrinogen is elevated, likely due to acute phase reaction. This is not consistent with DIC, but we will continue to monitor.  Metastatic Breast Cancer with Mets to the Brain and Bone -Sees Dr. Jacqualine Mau in outpatient setting -Had a dural based metastasis overlying the right frontotemporal convexity and associated vasogenic edema -Recently had chemotherapy with Kadcyla -Continue with Keppra 500 mg BID 06/01/2018: Oncology team has been consulted.  Input is highly appreciated.  Leukocytosis -Likely in the setting of infection and recent Decadron use -Continue to treat urinary tract infection -Continue to monitor for signs and symptoms of infection -Repeat CBC in the a.m. 06/01/2018: Leukocytosis is improving. WBC has gone down from 16.1 to 12.5.  Hypokalemia -Patient's potassium this morning was 3.2 -Replete with p.o. potassium 40 mg twice daily x2 doses -Continue monitor and replete as necessary -Repeat CMP in the a.m. 06/01/2018: Repleted.  Potassium is 4.08 today.  Hypothyroidism -Check TSH -Continue with levothyroxine 88 mcg p.o. Daily  Abnormal LFTs/Elevated LFTs -Likely in the setting of IV chemotherapy with Kadcyla -Continue to monitor and trend and obtain the right upper quadrant ultrasound if continues to trend upwards -Repeat CMP in a.m. -06/01/2018: LFTs are improving slowly.  CAD/HTN -Continue with aspirin 81 mg p.o. Daily and Metoprolol Succinate 100 mg p.o. daily  DVT prophylaxis: SCDs given Thromobocytopenia Code Status: FULL CODE Family Communication:  Disposition Plan: Anticipate D/C Home. Consults called: Hematology Dr. Burr Medico Admission status: Inpatient Med-Surge   Procedures:   None  Antimicrobials:   IV Rocephin.   Subjective: Documented fever of 102.8 F. Despite  above, patient feels a lot better today. No shortness of breath. No chest pain. No nausea vomiting.  Objective: Vitals:   06/01/18 1615 06/01/18 2000 06/01/18 2205  06/02/18 0437  BP: (!) 141/79 (!) 163/91 (!) 141/80 134/76  Pulse: 91 79  80  Resp: 18 14  18   Temp: 98.9 F (37.2 C) 100 F (37.8 C) 99.8 F (37.7 C) 99 F (37.2 C)  TempSrc: Oral Oral Oral Oral  SpO2: 95% 100%  100%  Weight:    67.8 kg  Height:        Intake/Output Summary (Last 24 hours) at 06/02/2018 0516 Last data filed at 06/01/2018 2226 Gross per 24 hour  Intake 1324.87 ml  Output -  Net 1324.87 ml   Filed Weights   05/31/18 0801 06/01/18 0500 06/02/18 0437  Weight: 68 kg 67.5 kg 67.8 kg    Examination:  General exam: Appears calm and comfortable  Respiratory system: Clear to auscultation. Respiratory effort normal. Cardiovascular system: S1 & S2 heard Gastrointestinal system: Abdomen is nondistended, soft and nontender. No organomegaly or masses felt. Normal bowel sounds heard. Central nervous system: Alert and oriented. No focal neurological deficits. Extremities: No leg edema.   Psychiatry: Judgement and insight appear normal. Mood & affect appropriate.     Data Reviewed: I have personally reviewed following labs and imaging studies  CBC: Recent Labs  Lab 05/31/18 0843 05/31/18 1444 06/01/18 0558  WBC 16.1*  --  12.5*  NEUTROABS 13.3*  --   --   HGB 12.5  --  12.3  HCT 37.2  --  37.4  MCV 90.7  --  91.7  PLT 36* 34* 34*   Basic Metabolic Panel: Recent Labs  Lab 05/31/18 0843 06/01/18 0558  NA 135 137  K 3.2* 4.1  CL 100 103  CO2 25 25  GLUCOSE 134* 87  BUN 27* 14  CREATININE 0.90 0.67  CALCIUM 8.1* 7.6*  MG 2.1  --   PHOS 2.6  --    GFR: Estimated Creatinine Clearance: 70.7 mL/min (by C-G formula based on SCr of 0.67 mg/dL). Liver Function Tests: Recent Labs  Lab 05/31/18 0843 06/01/18 0558  AST 78* 55*  ALT 154* 116*  ALKPHOS 209* 221*  BILITOT 1.1 0.8  PROT 6.2* 6.3*  ALBUMIN 2.9* 2.6*   No results for input(s): LIPASE, AMYLASE in the last 168 hours. No results for input(s): AMMONIA in the last 168 hours. Coagulation  Profile: Recent Labs  Lab 05/31/18 1444  INR 1.12   Cardiac Enzymes: No results for input(s): CKTOTAL, CKMB, CKMBINDEX, TROPONINI in the last 168 hours. BNP (last 3 results) No results for input(s): PROBNP in the last 8760 hours. HbA1C: No results for input(s): HGBA1C in the last 72 hours. CBG: Recent Labs  Lab 06/01/18 0754  GLUCAP 78   Lipid Profile: No results for input(s): CHOL, HDL, LDLCALC, TRIG, CHOLHDL, LDLDIRECT in the last 72 hours. Thyroid Function Tests: Recent Labs    05/31/18 0843  TSH 1.222   Anemia Panel: No results for input(s): VITAMINB12, FOLATE, FERRITIN, TIBC, IRON, RETICCTPCT in the last 72 hours. Urine analysis:    Component Value Date/Time   COLORURINE YELLOW 05/31/2018 0843   APPEARANCEUR TURBID (A) 05/31/2018 0843   LABSPEC 1.010 05/31/2018 0843   PHURINE 6.0 05/31/2018 0843   GLUCOSEU NEGATIVE 05/31/2018 0843   HGBUR LARGE (A) 05/31/2018 0843   BILIRUBINUR NEGATIVE 05/31/2018 0843   KETONESUR NEGATIVE 05/31/2018 0843   PROTEINUR 30 (A) 05/31/2018 0843   NITRITE  NEGATIVE 05/31/2018 0843   LEUKOCYTESUR LARGE (A) 05/31/2018 0843   Sepsis Labs: @LABRCNTIP (procalcitonin:4,lacticidven:4)  ) Recent Results (from the past 240 hour(s))  Blood Culture (routine x 2)     Status: None (Preliminary result)   Collection Time: 05/31/18  8:43 AM  Result Value Ref Range Status   Specimen Description   Final    BLOOD RIGHT WRIST Performed at [facility] [address] [place], [place] [address]    Special Requests   Final    BOTTLES DRAWN AEROBIC AND ANAEROBIC Blood Culture adequate volume Performed at [facility] [address], [place], [place] [address]    Culture  Setup Time   Final    GRAM NEGATIVE RODS IN BOTH AEROBIC AND ANAEROBIC BOTTLES CRITICAL RESULT CALLED TO, READ BACK BY AND VERIFIED WITH: Lavell Luster PHARMD [id number] 06/01/18 A BROWNING    Culture   Final    GRAM NEGATIVE RODS CULTURE  REINCUBATED FOR BETTER GROWTH Performed at [facility], [place] [address] [place], [place] [address]    Report Status PENDING  Incomplete  Urine culture     Status: Abnormal (Preliminary result)   Collection Time: 05/31/18  8:43 AM  Result Value Ref Range Status   Specimen Description   Final    URINE, CLEAN CATCH Performed at [facility], [place] [address] [place], [place] [address]    Special Requests   Final    NONE Performed at [facility], [place] [address], [place], [place] [address]    Culture >=100,000 COLONIES/mL ESCHERICHIA COLI (A)  Final   Report Status PENDING  Incomplete  Blood Culture (routine x 2)     Status: Abnormal (Preliminary result)   Collection Time: 05/31/18  9:40 AM  Result Value Ref Range Status   Specimen Description   Final    BLOOD PORTA CATH Performed at [facility] [address], [place], [place] [address]    Special Requests   Final    BOTTLES DRAWN AEROBIC AND ANAEROBIC Blood Culture adequate volume Performed at [facility] [address], [place], [place] [address]    Culture  Setup Time   Final    IN BOTH AEROBIC AND ANAEROBIC BOTTLES GRAM NEGATIVE RODS CRITICAL RESULT CALLED TO, READ BACK BY AND VERIFIED WITH: Lavell Luster PHARMD [id number] 06/01/18 A BROWNING    Culture (A)  Final    ESCHERICHIA COLI CULTURE REINCUBATED FOR BETTER GROWTH Performed at [facility], [place] [address], [place],  [address]    Report Status PENDING  Incomplete  Blood Culture ID Panel (Reflexed)     Status: Abnormal   Collection Time: 05/31/18  9:40 AM  Result Value Ref Range Status   Enterococcus species NOT DETECTED NOT DETECTED Final   Listeria monocytogenes NOT DETECTED NOT DETECTED Final   Staphylococcus species NOT DETECTED NOT DETECTED Final   Staphylococcus aureus NOT DETECTED NOT DETECTED Final   Streptococcus species NOT DETECTED NOT DETECTED Final   Streptococcus  agalactiae NOT DETECTED NOT DETECTED Final   Streptococcus pneumoniae NOT DETECTED NOT DETECTED Final   Streptococcus pyogenes NOT DETECTED NOT DETECTED Final   Acinetobacter baumannii NOT DETECTED NOT DETECTED Final   Enterobacteriaceae species DETECTED (A) NOT DETECTED Final    Comment: Enterobacteriaceae represent a large family of gram-negative bacteria, not a single organism. CRITICAL RESULT CALLED TO, READ BACK BY AND VERIFIED WITHLavell Luster PHARMD [id number] 06/01/18 A BROWNING  Enterobacter cloacae complex NOT DETECTED NOT DETECTED Final   Escherichia coli DETECTED (A) NOT DETECTED Final    Comment: CRITICAL RESULT CALLED TO, READ BACK BY AND VERIFIED WITH: Lavell Luster PHARMD [id number] 06/01/18 A BROWNING    Klebsiella oxytoca NOT DETECTED NOT DETECTED Final   Klebsiella pneumoniae NOT DETECTED NOT DETECTED Final   Proteus species NOT DETECTED NOT DETECTED Final   Serratia marcescens NOT DETECTED NOT DETECTED Final   Carbapenem resistance NOT DETECTED NOT DETECTED Final   Haemophilus influenzae NOT DETECTED NOT DETECTED Final   Neisseria meningitidis NOT DETECTED NOT DETECTED Final   Pseudomonas aeruginosa NOT DETECTED NOT DETECTED Final   Candida albicans NOT DETECTED NOT DETECTED Final   Candida glabrata NOT DETECTED NOT DETECTED Final   Candida krusei NOT DETECTED NOT DETECTED Final   Candida parapsilosis NOT DETECTED NOT DETECTED Final   Candida tropicalis NOT DETECTED NOT DETECTED Final    Comment: Performed at [facility], [place] [address], [place], [place] [address]  MRSA PCR Screening     Status: None   Collection Time: 05/31/18  9:22 PM  Result Value Ref Range Status   MRSA by PCR NEGATIVE NEGATIVE Final    Comment:        The GeneXpert MRSA Assay (FDA approved for NASAL specimens only), is one component of a comprehensive MRSA colonization surveillance program. It is not intended to diagnose MRSA infection nor to guide or monitor treatment for MRSA  infections. Performed at [facility], [place] [address], [place], [place] [address]          Radiology Studies: No results found.      Scheduled Meds: . aspirin EC  81 mg Oral Daily  . cholecalciferol  1,000 Units Oral Daily  . docusate sodium  100 mg Oral BID  . levETIRAcetam  500 mg Oral BID  . levothyroxine  88 mcg Oral QAC breakfast  . metoprolol succinate  100 mg Oral Daily  . multivitamin with minerals  1 tablet Oral Daily   Continuous Infusions: . sodium chloride Stopped (06/01/18 2226)  . cefTRIAXone (ROCEPHIN)  IV 2 g (06/01/18 2226)     LOS: 1 days    Time spent: 35 minutes   Dana Allan, MD  [facility] Pager #: [phone] 7PM-7AM contact night coverage as above

## 2018-06-03 DIAGNOSIS — E039 Hypothyroidism, unspecified: Secondary | ICD-10-CM

## 2018-06-03 LAB — URINE CULTURE: Culture: >=100000 — AB

## 2018-06-03 LAB — CULTURE, BLOOD (ROUTINE X 2)
Special Requests: ADEQUATE
Special Requests: ADEQUATE

## 2018-06-03 LAB — CBC
HCT: 37.2 % (ref 36.0–46.0)
Hemoglobin: 12.3 g/dL (ref 12.0–15.0)
MCH: 29.9 pg (ref 26.0–34.0)
MCHC: 33.1 g/dL (ref 30.0–36.0)
MCV: 90.5 fL (ref 78.0–100.0)
Platelets: 69 10*3/uL — ABNORMAL LOW (ref 150–400)
RBC: 4.11 MIL/uL (ref 3.87–5.11)
RDW: 14.7 % (ref 11.5–15.5)
WBC: 7 10*3/uL (ref 4.0–10.5)

## 2018-06-03 LAB — GLUCOSE, CAPILLARY: Glucose-Capillary: 76 mg/dL (ref 70–99)

## 2018-06-03 MED ORDER — PREDNISONE 50 MG PO TABS: ORAL_TABLET | ORAL | 0 refills | Status: DC

## 2018-06-03 MED ORDER — CIPROFLOXACIN HCL 500 MG PO TABS: 500.0000 mg | ORAL_TABLET | Freq: Two times a day (BID) | ORAL | 0 refills | Status: AC

## 2018-06-03 MED ORDER — HEPARIN SOD (PORK) LOCK FLUSH 100 UNIT/ML IV SOLN
500.0000 [IU] | Freq: Once | INTRAVENOUS | Status: AC
  Administered 2018-06-03: 500 [IU] via INTRAVENOUS
  Filled 2018-06-03: qty 5

## 2018-06-03 MED ORDER — CIPROFLOXACIN HCL 500 MG PO TABS
500.0000 mg | ORAL_TABLET | Freq: Two times a day (BID) | ORAL | Status: DC
  Administered 2018-06-03: 500 mg via ORAL
  Filled 2018-06-03: qty 1

## 2018-06-03 NOTE — Progress Notes (Deleted)
Redness noted in patient right eye. Patient reported scratching during sleep. No c/o itching at this time.

## 2018-06-03 NOTE — Discharge Summary (Signed)
Physician Discharge Summary   Patient ID: [name] Riley MRN: [id number] DOB/AGE: 65-09-1951 65 y.o.  Admit date: 05/31/2018 Discharge date: 06/03/2018  Primary Care Physician:  [name], MD   Recommendations for Outpatient Follow-up:  1. Follow up with PCP in 1-2 weeks  Home Health: None Equipment/Devices: None  Discharge Condition: stable CODE STATUS: FULL  Diet recommendation: Regular diet   Discharge Diagnoses:   Sepsis with E. coli bacteremia . E. coli UTI (urinary tract infection) Thrombocytopenia . Breast cancer of upper-outer quadrant of left female breast ([place]) . Elevated LFTs . Essential hypertension . Hypertension . Hypothyroidism . Metastasis to bone ([place]) . Metastatic breast cancer ([place])   Consults: Oncology    Allergies:   Allergies  Allergen Reactions  . Cantaloupe Extract Allergy Skin Test Shortness Of Breath  . Contrast Media [Iodinated Diagnostic Agents] Shortness Of Breath and Rash  . Pravastatin Other (See Comments)    Legs hurt  . Zosyn [Piperacillin Sod-Tazobactam So] Rash and Other (See Comments)    Temperature increase, facial flushing     DISCHARGE MEDICATIONS: Allergies as of 06/03/2018      Reactions   Cantaloupe Extract Allergy Skin Test Shortness Of Breath   Contrast Media [iodinated Diagnostic Agents] Shortness Of Breath, Rash   Pravastatin Other (See Comments)   Legs hurt   Zosyn [piperacillin Sod-tazobactam So] Rash, Other (See Comments)   Temperature increase, facial flushing      Medication List    TAKE these medications   acetaminophen 500 MG tablet Commonly known as:  TYLENOL Take 1,000 mg by mouth every 6 (six) hours as needed for moderate pain.   aspirin 81 MG tablet Take 81 mg by mouth daily.   cholecalciferol 1000 units tablet Commonly known as:  VITAMIN D Take 1 tablet (1,000 Units total) by mouth daily.   ciprofloxacin 500 MG tablet Commonly known as:  CIPRO Take 1 tablet (500 mg total) by  mouth 2 (two) times daily for 11 days.   diphenhydrAMINE 25 MG tablet Commonly known as:  BENADRYL Take 50 mg by mouth at bedtime as needed for sleep.   levETIRAcetam 500 MG tablet Commonly known as:  KEPPRA Take 1 tablet (500 mg total) by mouth 2 (two) times daily.   levothyroxine 88 MCG tablet Commonly known as:  SYNTHROID, LEVOTHROID Take 88 mcg by mouth daily before breakfast. BRAND ONLY   lidocaine-prilocaine cream Commonly known as:  EMLA Apply topically as needed (for port access). Apply to affected area once   metoprolol succinate 100 MG 24 hr tablet Commonly known as:  TOPROL-XL Take 1 tablet (100 mg total) by mouth daily.   multivitamin with minerals tablet Take 1 tablet by mouth daily.   ondansetron 4 MG disintegrating tablet Commonly known as:  ZOFRAN-ODT Take 1 tablet (4 mg total) by mouth every 6 (six) hours as needed.   predniSONE 50 MG tablet Commonly known as:  DELTASONE Take 31m 13 hours, 7 hours, and 1 hour prior to CT scan.        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 coli  Hospital Course:  Sepsis secondary to E. coli bacteremia,E. coli UTI -Patient was placed on IV Rocephin 2 g daily, blood cultures showed E. coli.   -urine culture 8/15, E. coli, pansensitive -Transition to oral antibiotic ciprofloxacin 500 mg twice a day for 11 more days to complete full 2 weeks course   Thrombocytopenia -In the setting of IV chemotherapy Kadcyla, metastatic breast cancer, sepsis -Continue  antibiotics for E. coli urosepsis, -Platelets improving, patient was followed closely by oncology while inpatient  -Platelets 36,000 at the time of admission, improved to 69,000 at the time of discharge.   Metastatic breast cancer with mets to bone and brain -Followed by Dr. Sonny Dandy, last treated with kadcyla on 05/25/2018 -Patient was followed by oncology while inpatient. -Continue Keppra for seizure prophylaxis.  Patient had a dural based metastasis overlying the right frontotemporal convexity and vasogenic edema  Hypothyroidism -Continue levothyroxine   Elevated LFTs -In the setting of IV chemotherapy with kadcyla and urosepsis -Slightly improving, outpatient follow-up and may need right upper quadrant ultrasound if significantly worsens  Hypertension, CAD -Continue aspirin, beta-blocker  Day of Discharge S: No complaints, tolerating breakfast without any difficulty, eager to go home  BP 124/80 (BP Location: Right Arm)   Pulse 82   Temp 99.6 F (37.6 C) (Oral)   Resp 14   Ht _0  (1.727 m)   Wt 73.9 kg   SpO2 100%   BMI 24.77 kg/m   Physical Exam: General: Alert and awake oriented x3 not in any acute distress. HEENT: anicteric sclera, pupils reactive to light and accommodation CVS: S1-S2 clear no murmur rubs or gallops Chest: clear to auscultation bilaterally, no wheezing rales or rhonchi Abdomen: soft nontender, nondistended, normal bowel sounds Extremities: no cyanosis, clubbing or edema noted bilaterally Neuro: Cranial nerves II-XII intact, no focal neurological deficits   The results of significant diagnostics from this hospitalization (including imaging, microbiology, ancillary and laboratory) are listed below for reference.      Procedures/Studies:  Dg Chest 2 View  Result Date: 05/11/2018 CLINICAL DATA:  LEFT arm numbness and tachycardia today. Patient with metastatic breast cancer. EXAM: CHEST - 2 VIEW COMPARISON:  02/05/2018 CT and bone scans.  04/26/2016 and prior chest radiographs. FINDINGS: The cardiomediastinal silhouette is unremarkable. A LEFT subclavian Port-A-Cath is present with tip overlying the LOWER SVC. There is no evidence of focal airspace disease, pulmonary edema, suspicious pulmonary nodule/mass, pleural effusion, or pneumothorax. Diffuse sclerotic bony metastases are again noted. IMPRESSION: No evidence of acute cardiopulmonary disease. Diffuse sclerotic bony metastases again noted. Electronically Signed   By: Margarette Canada M.D.   On: 05/11/2018 15:00   Ct Head Wo Contrast  Result Date: 05/11/2018 CLINICAL DATA:  Left arm numbness. Rapid heart rate. Stage IV breast cancer. EXAM: CT HEAD WITHOUT CONTRAST TECHNIQUE: Contiguous axial images were obtained from the base of the skull through the vertex without intravenous contrast. COMPARISON:  None. FINDINGS: Brain: No subdural, epidural, or subarachnoid hemorrhage. Low-attenuation in the white matter of the right frontal lobe superiorly is most consistent with edema. A discrete mass is not seen. The overlying cortex is intact. Cerebellum, brainstem, and basal cisterns are normal. Ventricles and sulci are unremarkable. No midline shift. No acute cortical ischemia identified. Vascular: No hyperdense vessel or unexpected calcification. Skull: Sclerosis in the calvarium, particularly on the right, consistent with bony metastatic disease. Sinuses/Orbits: No acute finding. Other: None. IMPRESSION: 1. Findings are most consistent with edema in the right frontal white matter. Given history of malignancy, I am  suspicious for an underlying mass as the cause for edema. Recommend an MRI with and without contrast for further evaluation. Findings called to Dr. Dayna Barker. Electronically Signed   By: Dorise Bullion III M.D   On: 05/11/2018 17:20   Mr Jodene Nam Head Wo Contrast  Result Date: 05/11/2018 CLINICAL DATA:  Initial evaluation for numbness in left arm. History of metastatic breast cancer. EXAM: MRI  HEAD WITHOUT AND WITH CONTRAST MRA HEAD WITHOUT CONTRAST TECHNIQUE: Multiplanar, multiecho pulse sequences of the brain and surrounding structures were obtained without and with intravenous contrast. Angiographic images of the head were obtained using MRA technique without contrast. CONTRAST:  53m MULTIHANCE GADOBENATE DIMEGLUMINE 529 MG/ML IV SOLN COMPARISON:  Comparison made with prior head CT from earlier same day as well as prior bone scan from 02/05/2018 FINDINGS: MRI HEAD FINDINGS Brain: Cerebral volume within normal limits. No significant cerebral white matter disease for age. No evidence for acute or subacute infarct. No encephalomalacia to suggest chronic infarction no evidence for acute or chronic intracranial hemorrhage. There is abnormal nodular dural thickening and enhancement overlying the right frontoparietal convexity near the vertex, concerning for dural base metastasis. Nodular area measures approximately 5.1 cm in length (series 21, image 7). Tumor measures 12 mm in maximal thickness (series 19, image 10). Adjacent dura overlying the right cerebral convexity is diffusely thickened with abnormal enhancement (series 19, image 12). Associated vasogenic edema within the underlying high right frontoparietal region without significant midline shift (series 14, image 39). No other evidence for intracranial metastatic disease. No other mass lesion or mass effect. No midline shift. No hydrocephalus. No extra-axial fluid collection. Pituitary gland within normal limits. Vascular: Major intracranial vascular flow voids are maintained Skull and upper cervical spine: Craniocervical junction within normal limits. Decreased T1/T2 hypointensity seen throughout the bone marrow of the upper cervical spine, clivus, with scattered involvement of the calvaria, most consistent with osseous metastases. No scalp soft tissue abnormality. Sinuses/Orbits: Globes and orbital soft tissues within normal limits. Mild scattered  mucosal thickening throughout the ethmoidal air cells and maxillary sinuses. Left maxillary sinus retention cyst noted. No mastoid effusion. Inner ear structures normal. Other: None. MRA HEAD FINDINGS ANTERIOR CIRCULATION: Visualized distal cervical segments of the internal carotid arteries are patent with antegrade flow. Petrous, cavernous, and supraclinoid segments patent without hemodynamically significant stenosis. Apparent defect at the anterior genu of the cavernous ICAs bilaterally favored to be artifactual. Origin of the ophthalmic arteries patent bilaterally. ICA termini widely patent. A1 segments, anterior communicating artery, and anterior cerebral arteries widely patent. M1 segments patent without stenosis. Distal MCA branches well perfused and symmetric. POSTERIOR CIRCULATION: Vertebral arteries widely patent to the vertebrobasilar junction without stenosis. Left vertebral artery dominant. Right PICA patent. Left PICA not visualized. Basilar widely patent to its distal aspect. Superior cerebellar arteries patent bilaterally. Right PCA supplied via the basilar. Fetal type origin of the left PCA. PCAs widely patent to their distal aspects. No intracranial aneurysm. IMPRESSION: MRI HEAD IMPRESSION: 1. Dural-based metastasis overlying the right frontoparietal convexity as above. Associated vasogenic edema within the underlying right cerebral hemisphere without significant midline shift. 2. Signal abnormality throughout the visualized bone marrow, compatible with osseous metastatic disease. 3. No other acute intracranial abnormality. No evidence for acute infarct. MRA HEAD IMPRESSION: Normal intracranial MRA. Electronically Signed   By: BJeannine BogaM.D.   On: 05/11/2018 19:39   Mr BJeri CosAnd Wo Contrast  Result Date: 05/11/2018 CLINICAL DATA:  Initial evaluation for numbness in left arm. History of  metastatic breast cancer. EXAM: MRI HEAD WITHOUT AND WITH CONTRAST MRA HEAD WITHOUT CONTRAST  TECHNIQUE: Multiplanar, multiecho pulse sequences of the brain and surrounding structures were obtained without and with intravenous contrast. Angiographic images of the head were obtained using MRA technique without contrast. CONTRAST:  37m MULTIHANCE GADOBENATE DIMEGLUMINE 529 MG/ML IV SOLN COMPARISON:  Comparison made with prior head CT from earlier same day as well as prior bone scan from 02/05/2018 FINDINGS: MRI HEAD FINDINGS Brain: Cerebral volume within normal limits. No significant cerebral white matter disease for age. No evidence for acute or subacute infarct. No encephalomalacia to suggest chronic infarction no evidence for acute or chronic intracranial hemorrhage. There is abnormal nodular dural thickening and enhancement overlying the right frontoparietal convexity near the vertex, concerning for dural base metastasis. Nodular area measures approximately 5.1 cm in length (series 21, image 7). Tumor measures 12 mm in maximal thickness (series 19, image 10). Adjacent dura overlying the right cerebral convexity is diffusely thickened with abnormal enhancement (series 19, image 12). Associated vasogenic edema within the underlying high right frontoparietal region without significant midline shift (series 14, image 39). No other evidence for intracranial metastatic disease. No other mass lesion or mass effect. No midline shift. No hydrocephalus. No extra-axial fluid collection. Pituitary gland within normal limits. Vascular: Major intracranial vascular flow voids are maintained Skull and upper cervical spine: Craniocervical junction within normal limits. Decreased T1/T2 hypointensity seen throughout the bone marrow of the upper cervical spine, clivus, with scattered involvement of the calvaria, most consistent with osseous metastases. No scalp soft tissue abnormality. Sinuses/Orbits: Globes and orbital soft tissues within normal limits. Mild scattered mucosal thickening throughout the ethmoidal air cells  and maxillary sinuses. Left maxillary sinus retention cyst noted. No mastoid effusion. Inner ear structures normal. Other: None. MRA HEAD FINDINGS ANTERIOR CIRCULATION: Visualized distal cervical segments of the internal carotid arteries are patent with antegrade flow. Petrous, cavernous, and supraclinoid segments patent without hemodynamically significant stenosis. Apparent defect at the anterior genu of the cavernous ICAs bilaterally favored to be artifactual. Origin of the ophthalmic arteries patent bilaterally. ICA termini widely patent. A1 segments, anterior communicating artery, and anterior cerebral arteries widely patent. M1 segments patent without stenosis. Distal MCA branches well perfused and symmetric. POSTERIOR CIRCULATION: Vertebral arteries widely patent to the vertebrobasilar junction without stenosis. Left vertebral artery dominant. Right PICA patent. Left PICA not visualized. Basilar widely patent to its distal aspect. Superior cerebellar arteries patent bilaterally. Right PCA supplied via the basilar. Fetal type origin of the left PCA. PCAs widely patent to their distal aspects. No intracranial aneurysm. IMPRESSION: MRI HEAD IMPRESSION: 1. Dural-based metastasis overlying the right frontoparietal convexity as above. Associated vasogenic edema within the underlying right cerebral hemisphere without significant midline shift. 2. Signal abnormality throughout the visualized bone marrow, compatible with osseous metastatic disease. 3. No other acute intracranial abnormality. No evidence for acute infarct. MRA HEAD IMPRESSION: Normal intracranial MRA. Electronically Signed   By: BJeannine BogaM.D.   On: 05/11/2018 19:39       LAB RESULTS: Basic Metabolic Panel: Recent Labs  Lab 05/31/18 0843 06/01/18 0558 06/02/18 0620  NA 135 137 133*  K 3.2* 4.1 3.6  CL 100 103 100  CO2 _0 GLUCOSE 134* 87 78  BUN 27* 14 13  CREATININE 0.90 0.67 0.53  CALCIUM 8.1* 7.6* 7.6*  MG 2.1   --  2.0  PHOS 2.6  --   --    Liver Function Tests: Recent Labs  Lab  06/01/18 0558 06/02/18 0620  AST 55* 46*  ALT 116* 90*  ALKPHOS 221* 248*  BILITOT 0.8 0.9  PROT 6.3* 6.3*  ALBUMIN 2.6* 2.6*   No results for input(s): LIPASE, AMYLASE in the last 168 hours. No results for input(s): AMMONIA in the last 168 hours. CBC: Recent Labs  Lab 06/02/18 0620 06/03/18 1112  WBC 7.5 7.0  NEUTROABS 5.5  --   HGB 12.1 12.3  HCT 36.6 37.2  MCV 91.0 90.5  PLT 44* 69*   Cardiac Enzymes: No results for input(s): CKTOTAL, CKMB, CKMBINDEX, TROPONINI in the last 168 hours. BNP: Invalid input(s): POCBNP CBG: Recent Labs  Lab 06/02/18 0739 06/03/18 0726  GLUCAP 76 76      Disposition and Follow-up: Discharge Instructions    Diet general   Complete by:  As directed    Increase activity slowly   Complete by:  As directed        DISPOSITION: Home   DISCHARGE FOLLOW-UP Follow-up Information    [name], MD. Schedule an appointment as soon as possible for a visit in 2 week(s).   Specialty:  Family Medicine Contact information: [address] [place] [address] [phone]            Time coordinating discharge:  35 minutes  Signed:   [name] M.D. [facility] 06/03/2018, 12:27 PM Pager: [phone]

## 2018-06-04 ENCOUNTER — Ambulatory Visit (HOSPITAL_COMMUNITY)
Admission: RE | Admit: 2018-06-04 | Discharge: 2018-06-04 | Disposition: A | Discharge status: Home / Self Care | Payer: Medicare Other | Source: Ambulatory Visit | Attending: Adult Health | Admitting: Adult Health

## 2018-06-04 ENCOUNTER — Telehealth: Payer: Self-pay

## 2018-06-04 ENCOUNTER — Encounter (HOSPITAL_COMMUNITY)
Admission: RE | Admit: 2018-06-04 | Discharge: 2018-06-04 | Disposition: A | Discharge status: Home / Self Care | Payer: Medicare Other | Source: Ambulatory Visit | Attending: Adult Health | Admitting: Adult Health

## 2018-06-04 ENCOUNTER — Telehealth: Payer: Self-pay | Admitting: Hematology and Oncology

## 2018-06-04 ENCOUNTER — Inpatient Hospital Stay: Payer: Medicare Other

## 2018-06-04 ENCOUNTER — Encounter (HOSPITAL_COMMUNITY): Payer: Self-pay

## 2018-06-04 VITALS — BP 151/93 | HR 94 | Temp 97.8°F | Resp 14

## 2018-06-04 DIAGNOSIS — C7931 Secondary malignant neoplasm of brain: Secondary | ICD-10-CM | POA: Diagnosis not present

## 2018-06-04 DIAGNOSIS — Z9013 Acquired absence of bilateral breasts and nipples: Secondary | ICD-10-CM | POA: Diagnosis not present

## 2018-06-04 DIAGNOSIS — Z95828 Presence of other vascular implants and grafts: Secondary | ICD-10-CM

## 2018-06-04 DIAGNOSIS — Z170 Estrogen receptor positive status [ER+]: Secondary | ICD-10-CM

## 2018-06-04 DIAGNOSIS — C50412 Malignant neoplasm of upper-outer quadrant of left female breast: Secondary | ICD-10-CM | POA: Diagnosis not present

## 2018-06-04 DIAGNOSIS — Z5112 Encounter for antineoplastic immunotherapy: Secondary | ICD-10-CM | POA: Diagnosis not present

## 2018-06-04 DIAGNOSIS — C7951 Secondary malignant neoplasm of bone: Secondary | ICD-10-CM | POA: Diagnosis not present

## 2018-06-04 DIAGNOSIS — Z79899 Other long term (current) drug therapy: Secondary | ICD-10-CM | POA: Diagnosis not present

## 2018-06-04 DIAGNOSIS — Z923 Personal history of irradiation: Secondary | ICD-10-CM | POA: Diagnosis not present

## 2018-06-04 DIAGNOSIS — R911 Solitary pulmonary nodule: Secondary | ICD-10-CM | POA: Diagnosis not present

## 2018-06-04 DIAGNOSIS — Z79811 Long term (current) use of aromatase inhibitors: Secondary | ICD-10-CM | POA: Diagnosis not present

## 2018-06-04 MED ORDER — HEPARIN SOD (PORK) LOCK FLUSH 100 UNIT/ML IV SOLN
500.0000 [IU] | Freq: Once | INTRAVENOUS | Status: AC
  Administered 2018-06-04: 500 [IU] via INTRAVENOUS

## 2018-06-04 MED ORDER — SODIUM CHLORIDE 0.9% FLUSH
10.0000 mL | INTRAVENOUS | Status: DC | PRN
  Administered 2018-06-04: 10 mL via INTRAVENOUS
  Filled 2018-06-04: qty 10

## 2018-06-04 MED ORDER — TECHNETIUM TC 99M MEDRONATE IV KIT
20.0000 | PACK | Freq: Once | INTRAVENOUS | Status: AC | PRN
  Administered 2018-06-04: 19.4 via INTRAVENOUS

## 2018-06-04 MED ORDER — IOHEXOL 300 MG/ML  SOLN
100.0000 mL | Freq: Once | INTRAMUSCULAR | Status: AC | PRN
  Administered 2018-06-04: 100 mL via INTRAVENOUS

## 2018-06-04 MED ORDER — HEPARIN SOD (PORK) LOCK FLUSH 100 UNIT/ML IV SOLN
INTRAVENOUS | Status: AC
  Filled 2018-06-04: qty 5

## 2018-06-04 NOTE — Telephone Encounter (Signed)
Called pt and lvm with call back number to let her know that Dr.Gudena had some openings tomorrow if she would like to come see him to discuss her CT results.

## 2018-06-04 NOTE — Telephone Encounter (Signed)
Spoke to patient regarding upcoming aug appt updates per 8/19 sch message  °

## 2018-06-05 ENCOUNTER — Telehealth: Payer: Self-pay

## 2018-06-05 ENCOUNTER — Inpatient Hospital Stay (HOSPITAL_BASED_OUTPATIENT_CLINIC_OR_DEPARTMENT_OTHER): Payer: Medicare Other | Admitting: Hematology and Oncology

## 2018-06-05 ENCOUNTER — Other Ambulatory Visit: Payer: Self-pay

## 2018-06-05 ENCOUNTER — Telehealth: Payer: Self-pay | Admitting: Pharmacist

## 2018-06-05 VITALS — BP 159/91 | HR 100 | Temp 98.5°F | Resp 17 | Ht 68.0 in | Wt 150.0 lb

## 2018-06-05 DIAGNOSIS — Z5112 Encounter for antineoplastic immunotherapy: Secondary | ICD-10-CM | POA: Diagnosis not present

## 2018-06-05 DIAGNOSIS — C7951 Secondary malignant neoplasm of bone: Secondary | ICD-10-CM

## 2018-06-05 DIAGNOSIS — C50412 Malignant neoplasm of upper-outer quadrant of left female breast: Secondary | ICD-10-CM

## 2018-06-05 DIAGNOSIS — Z9013 Acquired absence of bilateral breasts and nipples: Secondary | ICD-10-CM

## 2018-06-05 DIAGNOSIS — C7931 Secondary malignant neoplasm of brain: Secondary | ICD-10-CM | POA: Diagnosis not present

## 2018-06-05 DIAGNOSIS — Z79899 Other long term (current) drug therapy: Secondary | ICD-10-CM

## 2018-06-05 DIAGNOSIS — R911 Solitary pulmonary nodule: Secondary | ICD-10-CM

## 2018-06-05 DIAGNOSIS — Z79811 Long term (current) use of aromatase inhibitors: Secondary | ICD-10-CM

## 2018-06-05 DIAGNOSIS — Z7189 Other specified counseling: Secondary | ICD-10-CM | POA: Insufficient documentation

## 2018-06-05 DIAGNOSIS — Z170 Estrogen receptor positive status [ER+]: Secondary | ICD-10-CM

## 2018-06-05 DIAGNOSIS — C50919 Malignant neoplasm of unspecified site of unspecified female breast: Secondary | ICD-10-CM

## 2018-06-05 DIAGNOSIS — Z923 Personal history of irradiation: Secondary | ICD-10-CM

## 2018-06-05 MED ORDER — LETROZOLE 2.5 MG PO TABS: 2.5000 mg | ORAL_TABLET | Freq: Every day | ORAL | 3 refills | Status: DC

## 2018-06-05 MED ORDER — LEVETIRACETAM 500 MG PO TABS: 500.0000 mg | ORAL_TABLET | Freq: Two times a day (BID) | ORAL | 3 refills | Status: DC

## 2018-06-05 MED ORDER — ABEMACICLIB 150 MG PO TABS: 150.0000 mg | ORAL_TABLET | Freq: Two times a day (BID) | ORAL | 1 refills | Status: DC

## 2018-06-05 MED ORDER — PALBOCICLIB 125 MG PO CAPS: 125.0000 mg | ORAL_CAPSULE | Freq: Every day | ORAL | 1 refills | Status: DC

## 2018-06-05 NOTE — Assessment & Plan Note (Signed)
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 Was on Arimidex since 08/22/2012 to 03/30/16  Subcutaneous nodule excisionleft chest: Infiltrating carcinoma breast primary, ER positive, PR negative, HER-2 positive  Recent treatment: Kadcyla CT CAP and Bone scan: Progression of metastatic disease involving the bone, renal lesion and lung nodule  Recommendation: Switch treatment to Ibrance with letrozole along with Herceptin (to be given every 4 weeks) and Xgeva  Recent brain MRI showing dural based metastases: Because the patient is asymptomatic we decided to not perform any procedures at this time.  Recent hospitalization for UTI with sepsis: Patient appears to be doing much better

## 2018-06-05 NOTE — Telephone Encounter (Signed)
Oral Oncology Pharmacist Encounter  Received new prescription for Verzenio (abemaciclib) for the treatment of metastatic, hormone receptor positive breast cancer in conjunction with Femara, planned duration until disease progression or unacceptable toxicity.  Labs from 06/02/2018 assessed, OK for treatment. Noted resolving elevations in AST and ALT, no dose adjustment recommended by manufacturer Tbili remains WNL Noted pltc=44k, will continue to be closely monitored at initiation, likely due to recent sepsis and infusional chemotherapy  Current medication list in Epic reviewed, no DDIs with Verzenio identified.  Prescription has been e-scribed to the [facility] for benefits analysis and approval.  Oral Oncology Clinic will continue to follow for insurance authorization, copayment issues, initial counseling and start date.  Johny Drilling, PharmD, BCPS, BCOP  06/05/2018 11:56 AM Oral Oncology Clinic [phone]

## 2018-06-05 NOTE — Telephone Encounter (Signed)
Oral Oncology Patient Advocate Encounter  Received notification from [facility] that prior authorization for [name] Riley is required.  PA submitted on CoverMyMeds Key [id number] Status is pending  Oral Oncology Clinic will continue to follow.  [place] Patient [place] Phone [phone] Fax [phone]

## 2018-06-05 NOTE — Telephone Encounter (Signed)
Oral Oncology Pharmacist Encounter  Received notification from [facility] that prior authorization for Verzenio is required.  PA submitted on CoverMyMeds Key [id number] Status is pending  Oral Oncology Clinic will continue to follow.  Johny Drilling, PharmD, BCPS, BCOP  06/05/2018 12:20 PM Oral Oncology Clinic [phone]

## 2018-06-05 NOTE — Progress Notes (Signed)
DISCONTINUE ON PATHWAY REGIMEN - Breast     A cycle is every 21 days:     Ado-trastuzumab emtansine   **Always confirm dose/schedule in your pharmacy ordering system**  REASON: Disease Progression PRIOR TREATMENT: [id number]: Ado-Trastuzumab Emtansine q21 Days Until Progression or Toxicity TREATMENT RESPONSE: Progressive Disease (PD)  START ON PATHWAY REGIMEN - Breast     A cycle is every 21 days:     Trastuzumab-xxxx      Trastuzumab-xxxx   **Always confirm dose/schedule in your pharmacy ordering system**  Patient Characteristics: Distant Metastases or Locoregional Recurrent Disease - Unresected, HER2 Positive, ER Positive, HER2-Targeted Therapy (Concurrent with Endocrine Therapy) Therapeutic Status: Distant Metastases BRCA Mutation Status: Absent ER Status: Positive (+) HER2 Status: Positive (+) PR Status: Positive (+) Intent of Therapy: Non-Curative / Palliative Intent, Discussed with Patient

## 2018-06-05 NOTE — Progress Notes (Signed)
Patient Care Team: [name], MD as PCP - General (Family Medicine)  DIAGNOSIS:  Encounter Diagnosis  Name Primary?  . Malignant neoplasm of upper-outer quadrant of left breast in female, estrogen receptor positive ([place])     SUMMARY OF ONCOLOGIC HISTORY:   Breast cancer of upper-outer quadrant of left female breast ([name] Riley)   11/14/2011 Surgery    Bilateral mastectomy, prophylactic on the right, left breast IDC 3/18 lymph nodes positive with extracapsular extension ER 89%, PR 81%, HER-2 negative, Ki-67 79% T2 N1 A. stage IIB    12/13/2011 - 06/28/2012 Chemotherapy    4 cycles of FEC followed by 4 cycles of Taxotere    07/17/2012 - 08/22/2012 Radiation Therapy    Adjuvant radiation therapy    08/22/2012 - 03/16/2016 Anti-estrogen oral therapy    Arimidex 1 mg daily    03/16/2016 Relapse/Recurrence    Subcutaneous nodule excision left chest: Infiltrating carcinoma breast primary, ER positive, PR negative    03/29/2016 Imaging    CT CAP and bone scan: Lytic lesions T8 vertebral, T1 posterior element, subcutaneous nodule left lateral chest wall, nonspecific lung nodules; Bone scan: Mets to kull, left humerus, left eighth rib, T7/T8, sternum, left acetabulum    04/28/2016 - 06/17/2017 Chemotherapy    Herceptin, lapatinib, Faslodex, Zometa every 4 weeks, lapatinib discontinued in September 2018 due to elevation of LFTs    06/22/2017 Relapse/Recurrence    Surgical excision:Soft tissue mass left lateral chest wall primary breast cancer, soft tissue mass left medial chest wall breast cancer, tumor is within the dermis extending to the subcutaneous adipose tissue and involves portions of skeletal muscle    08/2017 - 02/16/2018 Chemotherapy    Faslodex with Herceptin and Perjeta along with Zometa every 4 weeks     03/02/2018 - 05/25/2018 Chemotherapy    Kadcyla     05/11/2018 Imaging    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 Associated vasogenic edema within the  underlying right cerebral hemisphere without significant midline shift. Signal abnormality throughout the visualized bone marrow, compatible with osseous metastatic disease.       06/04/2018 Imaging    CT CAP: Right lower lobe lung nodule 7 mm (was 5 mm); multiple bone metastases throughout the spine and ribs sternum scapula and humerus, slightly increased lower thoracic mets, right renal lesion 2.5 cm (was 1.2 cm) right femur met increased from 2.1 cm to 2.9 cm     CHIEF COMPLIANT: Follow-up to discuss the CT scans and also from recent hospitalization for UTI with sepsis  INTERVAL HISTORY: [name] Riley is a 67 year old lady with metastatic breast cancer that is ER PR positive and HER-2 positive who was on Kadcyla and had CTs chest abdomen pelvis and bone scan.  He was recently hospitalized with UTI and sepsis and is currently on 2 more weeks of antibiotics.  She denies any further problems with fevers or chills.  She decided not to see the neurologist and wishes that I refer her to our neuro oncologist.  Her biggest complaints are related to pain in the left hip as well as low back.    REVIEW OF SYSTEMS:   Constitutional: Denies fevers, chills or abnormal weight loss Eyes: Denies blurriness of vision Ears, nose, mouth, throat, and face: Denies mucositis or sore throat Respiratory: Denies cough, dyspnea or wheezes Cardiovascular: Denies palpitation, chest discomfort Gastrointestinal:  Denies nausea, heartburn or change in bowel habits Skin: Denies abnormal skin rashes Lymphatics: Denies new lymphadenopathy or easy bruising Neurological:Denies numbness, tingling or  new weaknesses Behavioral/Psych: Mood is stable, no new changes  Extremities: No lower extremity edema   All other systems were reviewed with the patient and are negative.  I have reviewed the past medical history, past surgical history, social history and family history with the patient and they are unchanged from previous  note.  ALLERGIES:  is allergic to cantaloupe extract allergy skin test; contrast media [iodinated diagnostic agents]; pravastatin; and zosyn [piperacillin sod-tazobactam so].  MEDICATIONS:  Current Outpatient Medications  Medication Sig Dispense Refill  . acetaminophen (TYLENOL) 500 MG tablet Take 1,000 mg by mouth every 6 (six) hours as needed for moderate pain.    Marland Kitchen aspirin 81 MG tablet Take 81 mg by mouth daily.      . cholecalciferol (VITAMIN D) 1000 units tablet Take 1 tablet (1,000 Units total) by mouth daily.    . ciprofloxacin (CIPRO) 500 MG tablet Take 1 tablet (500 mg total) by mouth 2 (two) times daily for 11 days. 22 tablet 0  . diphenhydrAMINE (BENADRYL) 25 MG tablet Take 50 mg by mouth at bedtime as needed for sleep.    Marland Kitchen letrozole (FEMARA) 2.5 MG tablet Take 1 tablet (2.5 mg total) by mouth daily. 90 tablet 3  . levETIRAcetam (KEPPRA) 500 MG tablet Take 1 tablet (500 mg total) by mouth 2 (two) times daily. 60 tablet 3  . levothyroxine (SYNTHROID, LEVOTHROID) 88 MCG tablet Take 88 mcg by mouth daily before breakfast. BRAND ONLY    . lidocaine-prilocaine (EMLA) cream Apply topically as needed (for port access). Apply to affected area once 30 g 3  . metoprolol succinate (TOPROL-XL) 100 MG 24 hr tablet Take 1 tablet (100 mg total) by mouth daily. 90 tablet 3  . Multiple Vitamins-Minerals (MULTIVITAMIN WITH MINERALS) tablet Take 1 tablet by mouth daily.      . ondansetron (ZOFRAN-ODT) 4 MG disintegrating tablet Take 1 tablet (4 mg total) by mouth every 6 (six) hours as needed. 20 tablet 3  . [START ON 06/19/2018] palbociclib (IBRANCE) 125 MG capsule Take 1 capsule (125 mg total) by mouth daily with breakfast. Take whole with food. Take for 21 days on, 7 days off, repeat every 28 days. 21 capsule 1  . predniSONE (DELTASONE) 50 MG tablet Take 87m 13 hours, 7 hours, and 1 hour prior to CT scan. 3 tablet 0   No current facility-administered medications for this visit.     Facility-Administered Medications Ordered in Other Visits  Medication Dose Route Frequency Provider Last Rate Last Dose  . 0.9 %  sodium chloride infusion   Intravenous Continuous TDonnie Mesa MD      . acetaminophen (TYLENOL) tablet 975 mg  975 mg Oral Q6H TDonnie Mesa MD      . enoxaparin (LOVENOX) injection 40 mg  40 mg Subcutaneous Q24H TDonnie Mesa MD      . morphine 4 MG/ML injection 2-4 mg  2-4 mg Intravenous Q2H PRN TDonnie Mesa MD      . ondansetron (ZOFRAN-ODT) disintegrating tablet 4 mg  4 mg Oral Q6H PRN TDonnie Mesa MD       Or  . ondansetron (ZOFRAN) 4 mg in sodium chloride 0.9 % 50 mL IVPB  4 mg Intravenous Q6H PRN TDonnie Mesa MD      . oxyCODONE (Oxy IR/ROXICODONE) immediate release tablet 5-10 mg  5-10 mg Oral Q4H PRN TDonnie Mesa MD      . piperacillin-tazobactam (ZOSYN) 3.375 g in dextrose 5 % 50 mL IVPB  3.375 g Intravenous Q8H Tsuei,  Rodman Key, MD      . prochlorperazine (COMPAZINE) tablet 10 mg  10 mg Oral Q6H PRN Donnie Mesa, MD       Or  . prochlorperazine (COMPAZINE) injection 5-10 mg  5-10 mg Intravenous Q6H PRN Donnie Mesa, MD        PHYSICAL EXAMINATION: ECOG PERFORMANCE STATUS: 1 - Symptomatic but completely ambulatory  Vitals:   06/05/18 0832  BP: (!) 159/91  Pulse: 100  Resp: 17  Temp: 98.5 F (36.9 C)  SpO2: 98%   Filed Weights   06/05/18 0832  Weight: 150 lb (68 kg)    GENERAL:alert, no distress and comfortable SKIN: skin color, texture, turgor are normal, no rashes or significant lesions EYES: normal, Conjunctiva are pink and non-injected, sclera clear OROPHARYNX:no exudate, no erythema and lips, buccal mucosa, and tongue normal  NECK: supple, thyroid normal size, non-tender, without nodularity LYMPH:  no palpable lymphadenopathy in the cervical, axillary or inguinal LUNGS: clear to auscultation and percussion with normal breathing effort HEART: regular rate & rhythm and no murmurs and no lower extremity  edema ABDOMEN:abdomen soft, non-tender and normal bowel sounds MUSCULOSKELETAL:no cyanosis of digits and no clubbing  NEURO: alert & oriented x 3 with fluent speech, no focal motor/sensory deficits EXTREMITIES: No lower extremity edema  LABORATORY DATA:  I have reviewed the data as listed CMP Latest Ref Rng & Units 06/02/2018 06/01/2018 05/31/2018  Glucose 70 - 99 mg/dL 78 87 134(H)  BUN 8 - 23 mg/dL 13 14 27(H)  Creatinine 0.44 - 1.00 mg/dL 0.53 0.67 0.90  Sodium 135 - 145 mmol/L 133(L) 137 135  Potassium 3.5 - 5.1 mmol/L 3.6 4.1 3.2(L)  Chloride 98 - 111 mmol/L 100 103 100  CO2 22 - 32 mmol/L _0 Calcium 8.9 - 10.3 mg/dL 7.6(L) 7.6(L) 8.1(L)  Total Protein 6.5 - 8.1 g/dL 6.3(L) 6.3(L) 6.2(L)  Total Bilirubin 0.3 - 1.2 mg/dL 0.9 0.8 1.1  Alkaline Phos 38 - 126 U/L 248(H) 221(H) 209(H)  AST 15 - 41 U/L 46(H) 55(H) 78(H)  ALT 0 - 44 U/L 90(H) 116(H) 154(H)    Lab Results  Component Value Date   WBC 7.0 06/03/2018   HGB 12.3 06/03/2018   HCT 37.2 06/03/2018   MCV 90.5 06/03/2018   PLT 69 (L) 06/03/2018   NEUTROABS 5.5 06/02/2018    ASSESSMENT & PLAN:  Breast cancer of upper-outer quadrant of left female breast ([place]) Left breast invasive ductal carcinomaT2, N1, 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 Was on Arimidex since 08/22/2012 to 03/30/16  Subcutaneous nodule excisionleft chest: Infiltrating carcinoma breast primary, ER positive, PR negative, HER-2 positive  Recent treatment: Kadcyla CT CAP and Bone scan: Progression of metastatic disease involving the bone, renal lesion and lung nodule  Recommendation: Switch treatment to Ibrance with letrozole along with Herceptin (to be given every 4 weeks) and Xgeva Recent brain MRI showing dural based metastases: Because the patient is asymptomatic we decided to not perform any  procedures at this time.  Ibrance: I discussed the risks and benefits of Ibrance including myelosuppression especially neutropenia and with that risk of infection, there is risk of pulmonary embolism and mild peripheral neuropathy as well. Fatigue, nausea, diarrhea, decreased appetite as well as alopecia and thrombocytopenia are also potential side effects of Ibrance She will start Ibrance in 2 weeks after she  completes antibiotics.  Recent hospitalization for UTI with sepsis: Patient appears to be doing much better Return to clinic August 30 for Herceptin infusion.   Orders Placed This Encounter  Procedures  . CBC with Differential (Cancer Center Only)    Standing Status:   Future    Standing Expiration Date:   06/06/2019  . CMP ([place] only)    Standing Status:   Future    Standing Expiration Date:   06/06/2019   The patient has a good understanding of the overall plan. she agrees with it. she will call with any problems that may develop before the next visit here.   Harriette Ohara, MD 06/05/18

## 2018-06-06 ENCOUNTER — Telehealth: Payer: Self-pay | Admitting: Hematology and Oncology

## 2018-06-06 NOTE — Telephone Encounter (Signed)
Per 8/20 los.  Called patient with appt changes.  Was not able to get through as voice mailbox was full.  Mailed calendar.

## 2018-06-06 NOTE — Telephone Encounter (Signed)
Oral Chemotherapy Pharmacist Encounter   I spoke with patient for overview of: Verzenio.   Discussed with patient change in medication regimen from Ibrance (palbociclib) due to data that shows increased CNS penetration with the use of Verzenio.  Counseled patient on administration, dosing, side effects, monitoring, drug-food interactions, safe handling, storage, and disposal.  Patient will take Verzenio 150mg  tablets, 1 tablet by mouth twice daily without regard to food.  Patient knows to avoid grapefruit and grapefruit juice.  Verzenio start date: ~06/15/2018  Adverse effects include but are not limited to: diarrhea, fatigue, nausea, abdominal pain, hair loss, decreased blood counts, and increased liver function tests, and joint pains.  Patient has anti-emetic on hand and knows to take it if nausea develops.   Patient will obtain anti diarrheal and alert the office of 4 or more loose stools above baseline.  Reviewed with patient importance of keeping a medication schedule and plan for any missed doses.  Mrs. Torian voiced understanding and appreciation.   All questions answered. Medication reconciliation performed and medication/allergy list updated.  Patient to complete the remainder of her antibiotic course, following recent admission for sepsis, prior to starting on Verzenio. Patient plans to pick up her Verzenio from the [facility] after her office visit with Dr. Lindi Adie on 06/15/2018.  Oral oncology patient advocate is working with patient in an effort to obtain copayment foundation assistance in order to make her specialty medication affordable.  Patient knows to call the office with questions or concerns. Oral Oncology Clinic will continue to follow.  Thank you,  Johny Drilling, PharmD, BCPS, BCOP  06/06/2018   2:52 PM Oral Oncology Clinic [phone]

## 2018-06-07 LAB — CULTURE, BLOOD (ROUTINE X 2)
Culture: NO GROWTH
Culture: NO GROWTH
Special Requests: ADEQUATE

## 2018-06-07 NOTE — Telephone Encounter (Signed)
Oral Oncology Patient Advocate Encounter  Prior Authorization for Melynda Keller has been approved.    PA# [id number] Effective dates: 06/05/18 through 11/18/18  Oral Oncology Clinic will continue to follow.   [place] Patient [place] Phone [phone] Fax [phone]

## 2018-06-08 NOTE — Telephone Encounter (Signed)
Oral Oncology Patient Advocate Encounter  Prior Authorization for Leslee Home has been approved.    PA# [id number] Effective dates: 06/06/18 through 12/07/18  Oral Oncology Clinic will continue to follow.   [place] Patient [place] Phone [phone] Fax [phone]

## 2018-06-11 DIAGNOSIS — Z79899 Other long term (current) drug therapy: Secondary | ICD-10-CM | POA: Insufficient documentation

## 2018-06-12 NOTE — Telephone Encounter (Signed)
"  [place] because my husband told me the office is calling his phone trying to reach me."  Appointment calendar mailed with appointment changes.  Provided information at this time; recommended new calendar can be provided with next visit.

## 2018-06-13 ENCOUNTER — Telehealth: Payer: Self-pay

## 2018-06-13 NOTE — Telephone Encounter (Signed)
Oral Oncology Patient Advocate Encounter  Verzenio copay is [phone]. I spoke with the patient and we applied for the [employer]. [employer] wanted income documents. While we were waiting on income documents to come [place] closed the Breast grant.  I then called [employer] and applied for her a grant there over the phone, they also need income documents. I faxed those this morning and waiting on a response from [employer].  I called the patient and explained all of this to her. She verbalized understanding and appreciation.  [place] Patient [place] Phone [phone] Fax [phone]

## 2018-06-15 ENCOUNTER — Inpatient Hospital Stay: Payer: Medicare Other

## 2018-06-15 ENCOUNTER — Ambulatory Visit: Payer: Medicare Other | Admitting: Hematology and Oncology

## 2018-06-15 VITALS — BP 124/85 | HR 95 | Temp 99.5°F | Resp 17 | Wt 146.5 lb

## 2018-06-15 DIAGNOSIS — C50412 Malignant neoplasm of upper-outer quadrant of left female breast: Secondary | ICD-10-CM

## 2018-06-15 DIAGNOSIS — Z170 Estrogen receptor positive status [ER+]: Secondary | ICD-10-CM

## 2018-06-15 DIAGNOSIS — C50919 Malignant neoplasm of unspecified site of unspecified female breast: Secondary | ICD-10-CM

## 2018-06-15 DIAGNOSIS — Z5112 Encounter for antineoplastic immunotherapy: Secondary | ICD-10-CM | POA: Diagnosis not present

## 2018-06-15 DIAGNOSIS — Z95828 Presence of other vascular implants and grafts: Secondary | ICD-10-CM

## 2018-06-15 DIAGNOSIS — C7951 Secondary malignant neoplasm of bone: Secondary | ICD-10-CM

## 2018-06-15 LAB — CMP (CANCER CENTER ONLY)
ALT: 46 U/L — ABNORMAL HIGH (ref 0–44)
AST: 52 U/L — ABNORMAL HIGH (ref 15–41)
Albumin: 2.7 g/dL — ABNORMAL LOW (ref 3.5–5.0)
Alkaline Phosphatase: 281 U/L — ABNORMAL HIGH (ref 38–126)
Anion gap: 9 (ref 5–15)
BUN: 11 mg/dL (ref 8–23)
CO2: 27 mmol/L (ref 22–32)
Calcium: 8.7 mg/dL — ABNORMAL LOW (ref 8.9–10.3)
Chloride: 101 mmol/L (ref 98–111)
Creatinine: 0.7 mg/dL (ref 0.44–1.00)
GFR, Est AFR Am: >60 mL/min (ref >60)
GFR, Estimated: >60 mL/min (ref >60)
Glucose, Bld: 116 mg/dL — ABNORMAL HIGH (ref 70–99)
Potassium: 3.6 mmol/L (ref 3.5–5.1)
Sodium: 137 mmol/L (ref 135–145)
Total Bilirubin: 0.4 mg/dL (ref 0.3–1.2)
Total Protein: 6.7 g/dL (ref 6.5–8.1)

## 2018-06-15 LAB — CBC WITH DIFFERENTIAL (CANCER CENTER ONLY)
Basophils Absolute: 0.1 10*3/uL (ref 0.0–0.1)
Basophils Relative: 1 %
Eosinophils Absolute: 0.1 10*3/uL (ref 0.0–0.5)
Eosinophils Relative: 1 %
HCT: 34.3 % — ABNORMAL LOW (ref 34.8–46.6)
Hemoglobin: 11.2 g/dL — ABNORMAL LOW (ref 11.6–15.9)
Lymphocytes Relative: 25 %
Lymphs Abs: 2.5 10*3/uL (ref 0.9–3.3)
MCH: 29.9 pg (ref 25.1–34.0)
MCHC: 32.7 g/dL (ref 31.5–36.0)
MCV: 91.5 fL (ref 79.5–101.0)
Monocytes Absolute: 1.5 10*3/uL — ABNORMAL HIGH (ref 0.1–0.9)
Monocytes Relative: 15 %
Neutro Abs: 6 10*3/uL (ref 1.5–6.5)
Neutrophils Relative %: 58 %
Platelet Count: 138 10*3/uL — ABNORMAL LOW (ref 145–400)
RBC: 3.75 MIL/uL (ref 3.70–5.45)
RDW: 15.1 % — ABNORMAL HIGH (ref 11.2–14.5)
WBC Count: 10.2 10*3/uL (ref 3.9–10.3)

## 2018-06-15 MED ORDER — HEPARIN SOD (PORK) LOCK FLUSH 100 UNIT/ML IV SOLN
500.0000 [IU] | Freq: Once | INTRAVENOUS | Status: AC | PRN
  Administered 2018-06-15: 500 [IU]
  Filled 2018-06-15: qty 5

## 2018-06-15 MED ORDER — ACETAMINOPHEN 325 MG PO TABS
ORAL_TABLET | ORAL | Status: AC
  Filled 2018-06-15: qty 2

## 2018-06-15 MED ORDER — SODIUM CHLORIDE 0.9% FLUSH
10.0000 mL | Freq: Once | INTRAVENOUS | Status: AC
  Administered 2018-06-15: 10 mL
  Filled 2018-06-15: qty 10

## 2018-06-15 MED ORDER — TRASTUZUMAB CHEMO 150 MG IV SOLR
8.0000 mg/kg | Freq: Once | INTRAVENOUS | Status: AC
  Administered 2018-06-15: 546 mg via INTRAVENOUS
  Filled 2018-06-15: qty 26

## 2018-06-15 MED ORDER — DIPHENHYDRAMINE HCL 25 MG PO CAPS
ORAL_CAPSULE | ORAL | Status: AC
  Filled 2018-06-15: qty 2

## 2018-06-15 MED ORDER — ACETAMINOPHEN 325 MG PO TABS
650.0000 mg | ORAL_TABLET | Freq: Once | ORAL | Status: AC
  Administered 2018-06-15: 650 mg via ORAL

## 2018-06-15 MED ORDER — SODIUM CHLORIDE 0.9% FLUSH
10.0000 mL | INTRAVENOUS | Status: DC | PRN
  Administered 2018-06-15: 10 mL
  Filled 2018-06-15: qty 10

## 2018-06-15 MED ORDER — SODIUM CHLORIDE 0.9 % IV SOLN
Freq: Once | INTRAVENOUS | Status: AC
  Administered 2018-06-15: 13:00:00 via INTRAVENOUS
  Filled 2018-06-15: qty 250

## 2018-06-15 MED ORDER — DIPHENHYDRAMINE HCL 25 MG PO CAPS
50.0000 mg | ORAL_CAPSULE | Freq: Once | ORAL | Status: AC
  Administered 2018-06-15: 50 mg via ORAL

## 2018-06-15 MED FILL — VERZENIO 150 MG TAB: 150 | 28 days supply | Qty: 56 | Fill #0

## 2018-06-15 NOTE — Patient Instructions (Signed)
[facility] Discharge Instructions for Patients Receiving Chemotherapy  Today you received the following chemotherapy agents Herceptin  To help prevent nausea and vomiting after your treatment, we encourage you to take your nausea medication as directed   If you develop nausea and vomiting that is not controlled by your nausea medication, call the clinic.   BELOW ARE SYMPTOMS THAT SHOULD BE REPORTED IMMEDIATELY:  *FEVER GREATER THAN 100.5 F  *CHILLS WITH OR WITHOUT FEVER  NAUSEA AND VOMITING THAT IS NOT CONTROLLED WITH YOUR NAUSEA MEDICATION  *UNUSUAL SHORTNESS OF BREATH  *UNUSUAL BRUISING OR BLEEDING  TENDERNESS IN MOUTH AND THROAT WITH OR WITHOUT PRESENCE OF ULCERS  *URINARY PROBLEMS  *BOWEL PROBLEMS  UNUSUAL RASH Items with * indicate a potential emergency and should be followed up as soon as possible.  Feel free to call the clinic should you have any questions or concerns. The clinic phone number is [phone].  Please show the CHEMO ALERT CARD at check-in to the Emergency Department and triage nurse.   

## 2018-06-15 NOTE — Telephone Encounter (Addendum)
Oral Oncology Patient Advocate Encounter  I called to follow up on [employer] grant and it was approved. I was given billing information over the phone and it is as follows and has been shared with [place].  Approved 06/14/18-06/15/19 BIN: [id number] ID: [id number] PCN: [id number] Group: [id number]  I called the patient and she is actually in the office today, I spoke to her husband. I informed him of the information above and he will pick the medicine up today after her infusion.  He expressed great appreciation.  [place] Patient [place] Phone [phone] Fax [phone]

## 2018-06-19 NOTE — Telephone Encounter (Signed)
Oral Oncology Patient Advocate Encounter  Confirmed with [place] that Verzenio was picked up 06/15/18.  [place] Patient [place] Phone [phone] Fax [phone]

## 2018-06-29 ENCOUNTER — Telehealth: Payer: Self-pay | Admitting: Pharmacist

## 2018-06-29 NOTE — Telephone Encounter (Signed)
Oral Oncology Pharmacist Encounter  Received call from patient with questions about diarrhea management. Patient started on Verzenio on 06/16/2018  She has not experienced any ADE's up until today  Patient endorses an episode of diarrhea at noon today, and took Imodium She has since experienced 2 additional small episodes in the past hour and took Imodium after each episode  Patient concerned about dehydration  Patient endorses eating [place] food today and states that this does usually give her diarrhea  Patient instructed to continue to take Imodium at the first sign of another episode of diarrhea and that she can disregard the max dose indicated on the box  She will alert the office on Monday (07/02/2018) if she is still having to manage multiple daily episodes  We discussed following the BRAT diet and eating foods that are more bland until symptoms subside  Patient does endorse an episode of mild nausea that occurred today, which was relieved by ondansetron  Confirmed office visits on Tuesday, 07/03/2018, with patient  Patient expressed understanding and agreement with above plan. She knows to call the office with any additional questions or concerns.  Johny Drilling, PharmD, BCPS, BCOP  06/29/2018 1:48 PM Oral Oncology Clinic [phone]

## 2018-07-01 ENCOUNTER — Encounter (HOSPITAL_COMMUNITY): Payer: Self-pay | Admitting: Emergency Medicine

## 2018-07-01 ENCOUNTER — Emergency Department (HOSPITAL_COMMUNITY)
Admission: EM | Admit: 2018-07-01 | Discharge: 2018-07-01 | Disposition: A | Discharge status: Home / Self Care | Payer: Medicare Other | Attending: Emergency Medicine | Admitting: Emergency Medicine

## 2018-07-01 DIAGNOSIS — R202 Paresthesia of skin: Secondary | ICD-10-CM | POA: Insufficient documentation

## 2018-07-01 DIAGNOSIS — C50412 Malignant neoplasm of upper-outer quadrant of left female breast: Secondary | ICD-10-CM | POA: Insufficient documentation

## 2018-07-01 DIAGNOSIS — R531 Weakness: Secondary | ICD-10-CM | POA: Diagnosis not present

## 2018-07-01 DIAGNOSIS — I2510 Atherosclerotic heart disease of native coronary artery without angina pectoris: Secondary | ICD-10-CM | POA: Insufficient documentation

## 2018-07-01 DIAGNOSIS — R251 Tremor, unspecified: Secondary | ICD-10-CM | POA: Diagnosis present

## 2018-07-01 DIAGNOSIS — Z7982 Long term (current) use of aspirin: Secondary | ICD-10-CM | POA: Diagnosis not present

## 2018-07-01 DIAGNOSIS — I10 Essential (primary) hypertension: Secondary | ICD-10-CM | POA: Insufficient documentation

## 2018-07-01 DIAGNOSIS — E039 Hypothyroidism, unspecified: Secondary | ICD-10-CM | POA: Insufficient documentation

## 2018-07-01 DIAGNOSIS — Z79899 Other long term (current) drug therapy: Secondary | ICD-10-CM | POA: Insufficient documentation

## 2018-07-01 LAB — COMPREHENSIVE METABOLIC PANEL
ALT: 25 U/L (ref 0–44)
AST: 35 U/L (ref 15–41)
Albumin: 3.4 g/dL — ABNORMAL LOW (ref 3.5–5.0)
Alkaline Phosphatase: 164 U/L — ABNORMAL HIGH (ref 38–126)
Anion gap: 12 (ref 5–15)
BUN: 11 mg/dL (ref 8–23)
CO2: 28 mmol/L (ref 22–32)
Calcium: 8.5 mg/dL — ABNORMAL LOW (ref 8.9–10.3)
Chloride: 102 mmol/L (ref 98–111)
Creatinine, Ser: 0.64 mg/dL (ref 0.44–1.00)
GFR calc Af Amer: >60 mL/min (ref >60)
GFR calc non Af Amer: >60 mL/min (ref >60)
Glucose, Bld: 97 mg/dL (ref 70–99)
Potassium: 3.3 mmol/L — ABNORMAL LOW (ref 3.5–5.1)
Sodium: 142 mmol/L (ref 135–145)
Total Bilirubin: 0.8 mg/dL (ref 0.3–1.2)
Total Protein: 7.1 g/dL (ref 6.5–8.1)

## 2018-07-01 LAB — URINALYSIS, ROUTINE W REFLEX MICROSCOPIC
Bacteria, UA: NONE SEEN
Bilirubin Urine: NEGATIVE
Glucose, UA: NEGATIVE mg/dL
Ketones, ur: NEGATIVE mg/dL
Nitrite: NEGATIVE
Protein, ur: NEGATIVE mg/dL
Specific Gravity, Urine: 1.005 (ref 1.005–1.030)
pH: 8 (ref 5.0–8.0)

## 2018-07-01 LAB — CBC WITH DIFFERENTIAL/PLATELET
Basophils Absolute: 0 10*3/uL (ref 0.0–0.1)
Basophils Relative: 1 %
Eosinophils Absolute: 0.1 10*3/uL (ref 0.0–0.7)
Eosinophils Relative: 1 %
HCT: 34 % — ABNORMAL LOW (ref 36.0–46.0)
Hemoglobin: 11 g/dL — ABNORMAL LOW (ref 12.0–15.0)
Lymphocytes Relative: 49 %
Lymphs Abs: 2.1 10*3/uL (ref 0.7–4.0)
MCH: 30.1 pg (ref 26.0–34.0)
MCHC: 32.4 g/dL (ref 30.0–36.0)
MCV: 92.9 fL (ref 78.0–100.0)
Monocytes Absolute: 0.3 10*3/uL (ref 0.1–1.0)
Monocytes Relative: 7 %
Neutro Abs: 1.8 10*3/uL (ref 1.7–7.7)
Neutrophils Relative %: 42 %
Platelets: 72 10*3/uL — ABNORMAL LOW (ref 150–400)
RBC: 3.66 MIL/uL — ABNORMAL LOW (ref 3.87–5.11)
RDW: 15.4 % (ref 11.5–15.5)
WBC: 4.3 10*3/uL (ref 4.0–10.5)

## 2018-07-01 MED ORDER — HEPARIN SOD (PORK) LOCK FLUSH 100 UNIT/ML IV SOLN
500.0000 [IU] | Freq: Once | INTRAVENOUS | Status: AC
  Administered 2018-07-01: 500 [IU]
  Filled 2018-07-01: qty 5

## 2018-07-01 MED ORDER — SODIUM CHLORIDE 0.9 % IV SOLN
Freq: Once | INTRAVENOUS | Status: AC
  Administered 2018-07-01: 11:00:00 via INTRAVENOUS

## 2018-07-01 NOTE — ED Notes (Signed)
She tells me she is feeling better; and tells me she is hungry. She does not want food here, however, as she is planning for her husband to take her out to lunch.

## 2018-07-01 NOTE — ED Provider Notes (Signed)
[facility] DEPT Provider Note   CSN: [id number] Arrival date & time: 07/01/18  1157     History   Chief Complaint Chief Complaint  Patient presents with  . Diarrhea  . Weakness  . Tremors  . Numbness    HPI [name] Riley is a 67 y.o. female.  Patient is a 67 year old female who is currently receiving treatment for metastatic breast cancer.  She recently was started on new oral chemotherapy agent called Verzenio.  She started this on August 30.  She did have initially some diarrhea which resolved with Imodium and she no longer has that problem.  However over the last few days she started to have increasing tremors of her upper extremities and generalized fatigue.  She has had some numbness to her toes and feet.  She denies any weakness to her extremities.  No vision changes.  No speech deficits.  No significant headache.  No nausea or vomiting.  No dizziness.  She saw that her side effects of the medication and given the numbness felt that she should come for evaluation in the emergency department.  She denies any fevers.  No cough or cold symptoms.  No abdominal pain.  No urinary symptoms.  She denies any problems with her balance.     Past Medical History:  Diagnosis Date  . Allergy   . Breast cancer ([place]) 08/25/2011   L , invasive ductal carcinoma, ER/PR +,HER2 -  . Cancer [facility])    left breast cancer  . Coronary artery disease 2001  . Heart attack [facility]) 09/2000   Sep 25, 2000  --no intervention  . History of chemotherapy comp. 08/22/2012   4 cycles of FEC and $ cycles of Taxotere  . Hyperlipidemia   . Hypertension   . Hypothyroidism   . PONV (postoperative nausea and vomiting)    gets sick from anesthesia  . Status post radiation therapy 07/09/12 - 08/22/2012   Left Breast, 60.4 gray    Patient Active Problem List   Diagnosis Date Noted  . Goals of care, counseling/discussion 06/05/2018  . UTI (urinary tract infection) 05/31/2018  .  Sepsis secondary to UTI ([place]) 05/31/2018  . Thrombocytopenia ([place]) 05/31/2018  . Hypokalemia 05/31/2018  . Port-A-Cath in place 03/23/2018  . Elevated LFTs   . Metastatic breast cancer ([place]) 06/29/2017  . Nodule of skin of breast 03/06/2017  . Abscess, abdomen 07/06/2016  . Metastasis to bone ([place]) 06/01/2016  . Pneumothorax on left 04/25/2016  . Chest pain 10/23/2014  . Radiation-induced dermatitis 07/27/2012  . Hypertension   . Breast cancer of upper-outer quadrant of left female breast ([place]) 09/01/2011  . Hypothyroidism 06/12/2009  . HYPERLIPIDEMIA 06/12/2009  . Essential hypertension 06/12/2009  . Coronary atherosclerosis 06/12/2009  . DIZZINESS 06/12/2009  . PALPITATIONS 06/12/2009  . Heart attack ([place]) 09/16/2000    Past Surgical History:  Procedure Laterality Date  . ABDOMINAL HYSTERECTOMY  1998   TAH, oophorectomy  . APPENDECTOMY  1970  . BREAST SURGERY  1998   removal of benign lump in rt breast  . BREAST SURGERY  11/14/11   right simple mastectomy, left mrm  . INCISION AND DRAINAGE OF WOUND Left 07/01/2017   Procedure: IRRIGATION AND DEBRIDEMENT CHEST WALL ABSCESS;  Surgeon: Donnie Mesa, MD;  Location: [facility];  Service: General;  Laterality: Left;  Marland Kitchen MASS EXCISION Left 06/22/2017   Procedure: EXCISION OF CHEST WALL MASSES;  Surgeon: Donnie Mesa, MD;  Location: [facility];  Service: General;  Laterality:  Left;  . MASTECTOMY Bilateral    for left breast cancer  . OVARIAN CYST SURGERY Right 1970  . PORT-A-CATH REMOVAL  08/30/2012   Procedure: REMOVAL PORT-A-CATH;  Surgeon: Imogene Burn. [name], MD;  Location: [facility];  Service: General;  Laterality: Right;  port removal  . PORTACATH PLACEMENT  11/14/2011   Procedure: INSERTION PORT-A-CATH;  Surgeon: Imogene Burn. [name], MD;  Location: [place];  Service: General;  Laterality: Right;  . PORTACATH PLACEMENT N/A 04/25/2016   Procedure: INSERTION PORT-A-CATH LEFT CHEST;  Surgeon: Donnie Mesa, MD;  Location: [place];  Service: General;  Laterality: N/A;  . skin tags  05/09/1997   left axillary left neck skin tags  . TONSILLECTOMY  1968     OB History    Gravida  4   Para      Term      Preterm      AB  1   Living  3     SAB  1   TAB      Ectopic      Multiple      Live Births               Home Medications    Prior to Admission medications   Medication Sig Start Date End Date Taking? Authorizing Provider  abemaciclib (VERZENIO) 150 MG tablet Take 1 tablet (150 mg total) by mouth 2 (two) times daily. Swallow tablets whole. Do not chew, crush, or split tablets before swallowing. 06/18/18  Yes Nicholas Lose, MD  acetaminophen (TYLENOL) 500 MG tablet Take 1,000 mg by mouth every 6 (six) hours as needed for moderate pain.   Yes [provider]  aspirin 81 MG tablet Take 81 mg by mouth daily.     Yes [provider]  cholecalciferol (VITAMIN D) 1000 units tablet Take 1 tablet (1,000 Units total) by mouth daily. 04/13/18  Yes Nicholas Lose, MD  diphenhydrAMINE (BENADRYL) 25 MG tablet Take 50 mg by mouth at bedtime as needed for sleep.   Yes [provider]  letrozole (FEMARA) 2.5 MG tablet Take 1 tablet (2.5 mg total) by mouth daily. 06/05/18  Yes Nicholas Lose, MD  levETIRAcetam (KEPPRA) 500 MG tablet Take 1 tablet (500 mg total) by mouth 2 (two) times daily. 06/05/18  Yes Nicholas Lose, MD  levothyroxine (SYNTHROID, LEVOTHROID) 100 MCG tablet Take 100 mcg by mouth daily before breakfast. BRAND ONLY 02/01/18  Yes [provider]  lidocaine-prilocaine (EMLA) cream Apply topically as needed (for port access). Apply to affected area once 02/15/18  Yes Nicholas Lose, MD  metoprolol succinate (TOPROL-XL) 100 MG 24 hr tablet Take 1 tablet (100 mg total) by mouth daily. 09/22/17  Yes Nicholas Lose, MD  Multiple Vitamins-Minerals (MULTIVITAMIN WITH MINERALS) tablet Take 1 tablet by mouth daily.     Yes [provider]  ondansetron  (ZOFRAN-ODT) 4 MG disintegrating tablet Take 1 tablet (4 mg total) by mouth every 6 (six) hours as needed. Patient taking differently: Take 4 mg by mouth every 6 (six) hours as needed for nausea or vomiting.  03/02/18  Yes Nicholas Lose, MD    Family History Family History  Problem Relation Age of Onset  . Hypertension Maternal Grandmother   . Diabetes Maternal Grandmother   . Cancer Father 85       lung cancer and Prostate Cancer  . Hypertension Mother   . Cancer Paternal Aunt        ovarian  .  Cancer Cousin        breast, paternal cousin  . Cancer Paternal Uncle        stomach  . Cancer Paternal Grandfather        Esophagus  . Colon cancer Neg Hx     Social History Social History   Tobacco Use  . Smoking status: Never Smoker  . Smokeless tobacco: Never Used  Substance Use Topics  . Alcohol use: No  . Drug use: No     Allergies   Cantaloupe extract allergy skin test; Contrast media [iodinated diagnostic agents]; Pravastatin; and Zosyn [piperacillin sod-tazobactam so]   Review of Systems Review of Systems  Constitutional: Positive for fatigue. Negative for chills, diaphoresis and fever.  HENT: Negative for congestion, rhinorrhea and sneezing.   Eyes: Negative.   Respiratory: Negative for cough, chest tightness and shortness of breath.   Cardiovascular: Negative for chest pain and leg swelling.  Gastrointestinal: Negative for abdominal pain, blood in stool, diarrhea, nausea and vomiting.  Genitourinary: Negative for difficulty urinating, flank pain, frequency and hematuria.  Musculoskeletal: Negative for arthralgias and back pain.  Skin: Negative for rash.  Neurological: Positive for tremors and numbness. Negative for dizziness, speech difficulty, weakness and headaches.     Physical Exam Updated Vital Signs BP 127/81 (BP Location: Right Arm) Comment: Simultaneous filing. User may not have seen previous data.  Pulse 92 Comment: Simultaneous filing. User may not  have seen previous data.  Temp 98.3 F (36.8 C) (Oral)   Resp 16   SpO2 93% Comment: Simultaneous filing. User may not have seen previous data.  Physical Exam  Constitutional: She is oriented to person, place, and time. She appears well-developed and well-nourished.  HENT:  Head: Normocephalic and atraumatic.  Eyes: Pupils are equal, round, and reactive to light.  Neck: Normal range of motion. Neck supple.  Cardiovascular: Normal rate, regular rhythm and normal heart sounds.  Pulmonary/Chest: Effort normal and breath sounds normal. No respiratory distress. She has no wheezes. She has no rales. She exhibits no tenderness.  Abdominal: Soft. Bowel sounds are normal. There is no tenderness. There is no rebound and no guarding.  Musculoskeletal: Normal range of motion. She exhibits no edema.  Lymphadenopathy:    She has no cervical adenopathy.  Neurological: She is alert and oriented to person, place, and time.  Motor 5/5 all extremities Sensation grossly intact to LT all extremities Finger to Nose intact, no pronator drift CN II-XII grossly intact    Skin: Skin is warm and dry. No rash noted.  Psychiatric: She has a normal mood and affect.     ED Treatments / Results  Labs (all labs ordered are listed, but only abnormal results are displayed) Labs Reviewed  COMPREHENSIVE METABOLIC PANEL - Abnormal; Notable for the following components:      Result Value   Potassium 3.3 (*)    Calcium 8.5 (*)    Albumin 3.4 (*)    Alkaline Phosphatase 164 (*)    All other components within normal limits  CBC WITH DIFFERENTIAL/PLATELET - Abnormal; Notable for the following components:   RBC 3.66 (*)    Hemoglobin 11.0 (*)    HCT 34.0 (*)    Platelets 72 (*)    All other components within normal limits  URINALYSIS, ROUTINE W REFLEX MICROSCOPIC - Abnormal; Notable for the following components:   Color, Urine STRAW (*)    Hgb urine dipstick MODERATE (*)    Leukocytes, UA SMALL (*)    All  other components within normal limits  URINE CULTURE    EKG None  Radiology No results found.  Procedures Procedures (including critical care time)  Medications Ordered in ED Medications  heparin lock flush 100 unit/mL (has no administration in time range)  0.9 %  sodium chloride infusion ( Intravenous Stopped 07/01/18 1220)     Initial Impression / Assessment and Plan / ED Course  I have reviewed the triage vital signs and the nursing notes.  Pertinent labs & imaging results that were available during my care of the patient were reviewed by me and considered in my medical decision making (see chart for details).     Patient is labs are non-concerning.  Her platelet count is low and will need to be followed by her oncologist.  Her urinalysis has some hematuria which she says is chronic for her.  There is no overwhelming signs of infection.  It was sent for culture.  I spoke with Dr. Julien Nordmann with oncology who felt the patient can continue the medication and she has a follow-up appointment in 2 days with her oncologist to discuss the symptoms.  She does have paresthesias which is a potential side effect of the medication.  She does not have any focal neurologic symptoms that would be more concerning for worsening brain mass.  I did not feel this point that a CT head was indicated.  She will follow-up with her oncologist by phone tomorrow and has an appointment on Tuesday.  Return precautions were given.  Final Clinical Impressions(s) / ED Diagnoses   Final diagnoses:  Generalized weakness  Paresthesia    ED Discharge Orders    None       Malvin Johns, MD 07/01/18 1442

## 2018-07-01 NOTE — ED Triage Notes (Signed)
Pt started new chemo drug (Verzeniio) end of August and pt states she is experiencing possible side effects: diarrhea, intermittent numbness in lower extremities and pt states she started with tremors 2 days ago. Pt's diarrhea relieved with Immodium but then returns.

## 2018-07-01 NOTE — ED Notes (Signed)
Bed: WA17 Expected date:  Expected time:  Means of arrival:  Comments: Cancer pt 

## 2018-07-02 ENCOUNTER — Other Ambulatory Visit: Payer: Self-pay

## 2018-07-02 DIAGNOSIS — C50412 Malignant neoplasm of upper-outer quadrant of left female breast: Secondary | ICD-10-CM

## 2018-07-02 DIAGNOSIS — C7951 Secondary malignant neoplasm of bone: Secondary | ICD-10-CM

## 2018-07-02 DIAGNOSIS — Z170 Estrogen receptor positive status [ER+]: Secondary | ICD-10-CM

## 2018-07-02 NOTE — Assessment & Plan Note (Signed)
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 Was on Arimidex since 08/22/2012 to 03/30/16  Subcutaneous nodule excisionleft chest: Infiltrating carcinoma breast primary, ER positive, PR negative, HER-2 positive  Recent treatment: Kadcyla CT CAP and Bone scan: Progression of metastatic disease involving the bone, renal lesion and lung nodule  Recommendation: Switch treatment to Ibrance with letrozole along with Herceptin (to be given every 4 weeks) and Xgeva Recent brain MRI showing dural based metastases: Because the patient is asymptomatic we decided to not perform any procedures at this time.  Ibrance Toxicities  Continue current therapy

## 2018-07-03 ENCOUNTER — Inpatient Hospital Stay (HOSPITAL_BASED_OUTPATIENT_CLINIC_OR_DEPARTMENT_OTHER): Payer: Medicare Other | Admitting: Hematology and Oncology

## 2018-07-03 ENCOUNTER — Inpatient Hospital Stay: Payer: Medicare Other

## 2018-07-03 ENCOUNTER — Inpatient Hospital Stay: Payer: Medicare Other | Attending: Hematology and Oncology

## 2018-07-03 ENCOUNTER — Telehealth: Payer: Self-pay | Admitting: Hematology and Oncology

## 2018-07-03 DIAGNOSIS — C7951 Secondary malignant neoplasm of bone: Secondary | ICD-10-CM

## 2018-07-03 DIAGNOSIS — Z170 Estrogen receptor positive status [ER+]: Secondary | ICD-10-CM | POA: Insufficient documentation

## 2018-07-03 DIAGNOSIS — Z923 Personal history of irradiation: Secondary | ICD-10-CM | POA: Insufficient documentation

## 2018-07-03 DIAGNOSIS — T451X5A Adverse effect of antineoplastic and immunosuppressive drugs, initial encounter: Secondary | ICD-10-CM | POA: Diagnosis not present

## 2018-07-03 DIAGNOSIS — Z5112 Encounter for antineoplastic immunotherapy: Secondary | ICD-10-CM

## 2018-07-03 DIAGNOSIS — Z9221 Personal history of antineoplastic chemotherapy: Secondary | ICD-10-CM | POA: Diagnosis not present

## 2018-07-03 DIAGNOSIS — C50412 Malignant neoplasm of upper-outer quadrant of left female breast: Secondary | ICD-10-CM

## 2018-07-03 DIAGNOSIS — D6481 Anemia due to antineoplastic chemotherapy: Secondary | ICD-10-CM | POA: Insufficient documentation

## 2018-07-03 DIAGNOSIS — K5900 Constipation, unspecified: Secondary | ICD-10-CM | POA: Insufficient documentation

## 2018-07-03 DIAGNOSIS — R531 Weakness: Secondary | ICD-10-CM | POA: Diagnosis not present

## 2018-07-03 DIAGNOSIS — Z79811 Long term (current) use of aromatase inhibitors: Secondary | ICD-10-CM | POA: Diagnosis not present

## 2018-07-03 DIAGNOSIS — Z7982 Long term (current) use of aspirin: Secondary | ICD-10-CM | POA: Diagnosis not present

## 2018-07-03 DIAGNOSIS — Z79899 Other long term (current) drug therapy: Secondary | ICD-10-CM

## 2018-07-03 DIAGNOSIS — D696 Thrombocytopenia, unspecified: Secondary | ICD-10-CM | POA: Insufficient documentation

## 2018-07-03 DIAGNOSIS — Z95828 Presence of other vascular implants and grafts: Secondary | ICD-10-CM

## 2018-07-03 LAB — COMPREHENSIVE METABOLIC PANEL
ALT: 22 U/L (ref 0–44)
AST: 34 U/L (ref 15–41)
Albumin: 3.2 g/dL — ABNORMAL LOW (ref 3.5–5.0)
Alkaline Phosphatase: 170 U/L — ABNORMAL HIGH (ref 38–126)
Anion gap: 8 (ref 5–15)
BUN: 11 mg/dL (ref 8–23)
CO2: 30 mmol/L (ref 22–32)
Calcium: 9.1 mg/dL (ref 8.9–10.3)
Chloride: 101 mmol/L (ref 98–111)
Creatinine, Ser: 0.84 mg/dL (ref 0.44–1.00)
GFR calc Af Amer: >60 mL/min (ref >60)
GFR calc non Af Amer: >60 mL/min (ref >60)
Glucose, Bld: 92 mg/dL (ref 70–99)
Potassium: 3.5 mmol/L (ref 3.5–5.1)
Sodium: 139 mmol/L (ref 135–145)
Total Bilirubin: 0.5 mg/dL (ref 0.3–1.2)
Total Protein: 7.1 g/dL (ref 6.5–8.1)

## 2018-07-03 LAB — CBC WITH DIFFERENTIAL (CANCER CENTER ONLY)
Basophils Absolute: 0.1 10*3/uL (ref 0.0–0.1)
Basophils Relative: 2 %
Eosinophils Absolute: 0 10*3/uL (ref 0.0–0.5)
Eosinophils Relative: 1 %
HCT: 32.2 % — ABNORMAL LOW (ref 34.8–46.6)
Hemoglobin: 10.5 g/dL — ABNORMAL LOW (ref 11.6–15.9)
Lymphocytes Relative: 65 %
Lymphs Abs: 3.6 10*3/uL — ABNORMAL HIGH (ref 0.9–3.3)
MCH: 30.2 pg (ref 25.1–34.0)
MCHC: 32.6 g/dL (ref 31.5–36.0)
MCV: 92.5 fL (ref 79.5–101.0)
Monocytes Absolute: 0.3 10*3/uL (ref 0.1–0.9)
Monocytes Relative: 5 %
Neutro Abs: 1.5 10*3/uL (ref 1.5–6.5)
Neutrophils Relative %: 27 %
Platelet Count: 71 10*3/uL — ABNORMAL LOW (ref 145–400)
RBC: 3.48 MIL/uL — ABNORMAL LOW (ref 3.70–5.45)
RDW: 15.8 % — ABNORMAL HIGH (ref 11.2–14.5)
WBC Count: 5.5 10*3/uL (ref 3.9–10.3)

## 2018-07-03 MED ORDER — HEPARIN SOD (PORK) LOCK FLUSH 100 UNIT/ML IV SOLN
500.0000 [IU] | Freq: Once | INTRAVENOUS | Status: AC
  Administered 2018-07-03: 500 [IU]
  Filled 2018-07-03: qty 5

## 2018-07-03 MED ORDER — SODIUM CHLORIDE 0.9% FLUSH
10.0000 mL | Freq: Once | INTRAVENOUS | Status: AC
  Administered 2018-07-03: 10 mL
  Filled 2018-07-03: qty 10

## 2018-07-03 MED ORDER — FULVESTRANT 250 MG/5ML IM SOLN
INTRAMUSCULAR | Status: AC
  Filled 2018-07-03: qty 5

## 2018-07-03 NOTE — Telephone Encounter (Signed)
Gave avs and calendar ° °

## 2018-07-03 NOTE — Progress Notes (Signed)
Patient Care Team: Bridget Hartshorn, NP as PCP - General (Adult Health Nurse Practitioner)  DIAGNOSIS:  Encounter Diagnosis  Name Primary?  . Malignant neoplasm of upper-outer quadrant of left breast in female, estrogen receptor positive ([place])     SUMMARY OF ONCOLOGIC HISTORY:   Breast cancer of upper-outer quadrant of left female breast ([place])   11/14/2011 Surgery    Bilateral mastectomy, prophylactic on the right, left breast IDC 3/18 lymph nodes positive with extracapsular extension ER 89%, PR 81%, HER-2 negative, Ki-67 79% T2 N1 A. stage IIB    12/13/2011 - 06/28/2012 Chemotherapy    4 cycles of FEC followed by 4 cycles of Taxotere    07/17/2012 - 08/22/2012 Radiation Therapy    Adjuvant radiation therapy    08/22/2012 - 03/16/2016 Anti-estrogen oral therapy    Arimidex 1 mg daily    03/16/2016 Relapse/Recurrence    Subcutaneous nodule excision left chest: Infiltrating carcinoma breast primary, ER positive, PR negative    03/29/2016 Imaging    CT CAP and bone scan: Lytic lesions T8 vertebral, T1 posterior element, subcutaneous nodule left lateral chest wall, nonspecific lung nodules; Bone scan: Mets to kull, left humerus, left eighth rib, T7/T8, sternum, left acetabulum    04/28/2016 - 06/17/2017 Chemotherapy    Herceptin, lapatinib, Faslodex, Zometa every 4 weeks, lapatinib discontinued in September 2018 due to elevation of LFTs    06/22/2017 Relapse/Recurrence    Surgical excision:Soft tissue mass left lateral chest wall primary breast cancer, soft tissue mass left medial chest wall breast cancer, tumor is within the dermis extending to the subcutaneous adipose tissue and involves portions of skeletal muscle    08/2017 - 02/16/2018 Chemotherapy    Faslodex with Herceptin and Perjeta along with Zometa every 4 weeks     03/02/2018 - 05/25/2018 Chemotherapy    Kadcyla     05/11/2018 Imaging    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 Associated vasogenic  edema within the underlying right cerebral hemisphere without significant midline shift. Signal abnormality throughout the visualized bone marrow, compatible with osseous metastatic disease.       06/04/2018 Imaging    CT CAP: Right lower lobe lung nodule 7 mm (was 5 mm); multiple bone metastases throughout the spine and ribs sternum scapula and humerus, slightly increased lower thoracic mets, right renal lesion 2.5 cm (was 1.2 cm) right femur met increased from 2.1 cm to 2.9 cm    06/15/2018 -  Anti-estrogen oral therapy    Abemaciclib, Herceptin, letrozole, Xgeva     Metastatic breast cancer ([place])   06/29/2017 Initial Diagnosis    Metastatic breast cancer ([place])    06/05/2018 -  Chemotherapy    The patient had trastuzumab (HERCEPTIN) 546 mg in sodium chloride 0.9 % 250 mL chemo infusion, 8 mg/kg = 546 mg, Intravenous,  Once, 1 of 12 cycles Administration: 546 mg (06/15/2018)  for chemotherapy treatment.      CHIEF COMPLIANT: Follow-up on Abemaciclib along with letrozole and Herceptin  INTERVAL HISTORY: [name] Riley is a 67 year old lady with above-mentioned metastatic breast cancer that is ER positive and HER-2 positive who is currently on palbociclib along with letrozole and Herceptin.  She is tolerating abemaciclib significantly well.  She had one day when she had profound diarrhea subsequently she had an episode of severe tremors and panic attacks and went to the emergency room.  She was treated conservatively and was discharged home.  She did not have any significant diarrhea.  She had diarrhea for  1 day that improved with Imodium.  REVIEW OF SYSTEMS:   Constitutional: Denies fevers, chills or abnormal weight loss Eyes: Denies blurriness of vision Ears, nose, mouth, throat, and face: Denies mucositis or sore throat Respiratory: Denies cough, dyspnea or wheezes Cardiovascular: Denies palpitation, chest discomfort Gastrointestinal:  Denies nausea, heartburn or change in bowel  habits Skin: Denies abnormal skin rashes Lymphatics: Denies new lymphadenopathy or easy bruising Neurological:Denies numbness, tingling or new weaknesses Behavioral/Psych: Mood is stable, no new changes  Extremities: No lower extremity edema  All other systems were reviewed with the patient and are negative.  I have reviewed the past medical history, past surgical history, social history and family history with the patient and they are unchanged from previous note.  ALLERGIES:  is allergic to cantaloupe extract allergy skin test; contrast media [iodinated diagnostic agents]; pravastatin; and zosyn [piperacillin sod-tazobactam so].  MEDICATIONS:  Current Outpatient Medications  Medication Sig Dispense Refill  . abemaciclib (VERZENIO) 150 MG tablet Take 1 tablet (150 mg total) by mouth 2 (two) times daily. Swallow tablets whole. Do not chew, crush, or split tablets before swallowing. 56 tablet 1  . acetaminophen (TYLENOL) 500 MG tablet Take 1,000 mg by mouth every 6 (six) hours as needed for moderate pain.    Marland Kitchen aspirin 81 MG tablet Take 81 mg by mouth daily.      . cholecalciferol (VITAMIN D) 1000 units tablet Take 1 tablet (1,000 Units total) by mouth daily.    . diphenhydrAMINE (BENADRYL) 25 MG tablet Take 50 mg by mouth at bedtime as needed for sleep.    Marland Kitchen letrozole (FEMARA) 2.5 MG tablet Take 1 tablet (2.5 mg total) by mouth daily. 90 tablet 3  . levETIRAcetam (KEPPRA) 500 MG tablet Take 1 tablet (500 mg total) by mouth 2 (two) times daily. 60 tablet 3  . levothyroxine (SYNTHROID, LEVOTHROID) 100 MCG tablet Take 100 mcg by mouth daily before breakfast. BRAND ONLY    . lidocaine-prilocaine (EMLA) cream Apply topically as needed (for port access). Apply to affected area once 30 g 3  . metoprolol succinate (TOPROL-XL) 100 MG 24 hr tablet Take 1 tablet (100 mg total) by mouth daily. 90 tablet 3  . Multiple Vitamins-Minerals (MULTIVITAMIN WITH MINERALS) tablet Take 1 tablet by mouth daily.       . ondansetron (ZOFRAN-ODT) 4 MG disintegrating tablet Take 1 tablet (4 mg total) by mouth every 6 (six) hours as needed. (Patient taking differently: Take 4 mg by mouth every 6 (six) hours as needed for nausea or vomiting. ) 20 tablet 3   No current facility-administered medications for this visit.    Facility-Administered Medications Ordered in Other Visits  Medication Dose Route Frequency Provider Last Rate Last Dose  . 0.9 %  sodium chloride infusion   Intravenous Continuous Donnie Mesa, MD      . acetaminophen (TYLENOL) tablet 975 mg  975 mg Oral Q6H Donnie Mesa, MD      . enoxaparin (LOVENOX) injection 40 mg  40 mg Subcutaneous Q24H Donnie Mesa, MD      . morphine 4 MG/ML injection 2-4 mg  2-4 mg Intravenous Q2H PRN Donnie Mesa, MD      . ondansetron (ZOFRAN-ODT) disintegrating tablet 4 mg  4 mg Oral Q6H PRN Donnie Mesa, MD       Or  . ondansetron (ZOFRAN) 4 mg in sodium chloride 0.9 % 50 mL IVPB  4 mg Intravenous Q6H PRN Donnie Mesa, MD      . oxyCODONE (Oxy IR/ROXICODONE)  immediate release tablet 5-10 mg  5-10 mg Oral Q4H PRN Donnie Mesa, MD      . piperacillin-tazobactam (ZOSYN) 3.375 g in dextrose 5 % 50 mL IVPB  3.375 g Intravenous Q8H Donnie Mesa, MD      . prochlorperazine (COMPAZINE) tablet 10 mg  10 mg Oral Q6H PRN Donnie Mesa, MD       Or  . prochlorperazine (COMPAZINE) injection 5-10 mg  5-10 mg Intravenous Q6H PRN Donnie Mesa, MD        PHYSICAL EXAMINATION: ECOG PERFORMANCE STATUS: 1 - Symptomatic but completely ambulatory  Vitals:   07/03/18 1354  BP: (!) 154/96  Pulse: 93  Resp: 18  Temp: 98.7 F (37.1 C)   Filed Weights   07/03/18 1354  Weight: 140 lb 12.8 oz (63.9 kg)    GENERAL:alert, no distress and comfortable SKIN: skin color, texture, turgor are normal, no rashes or significant lesions EYES: normal, Conjunctiva are pink and non-injected, sclera clear OROPHARYNX:no exudate, no erythema and lips, buccal mucosa, and tongue  normal  NECK: supple, thyroid normal size, non-tender, without nodularity LYMPH:  no palpable lymphadenopathy in the cervical, axillary or inguinal LUNGS: clear to auscultation and percussion with normal breathing effort HEART: regular rate & rhythm and no murmurs and no lower extremity edema ABDOMEN:abdomen soft, non-tender and normal bowel sounds MUSCULOSKELETAL:no cyanosis of digits and no clubbing  NEURO: alert & oriented x 3 with fluent speech, no focal motor/sensory deficits EXTREMITIES: No lower extremity edema   LABORATORY DATA:  I have reviewed the data as listed CMP Latest Ref Rng & Units 07/03/2018 07/01/2018 06/15/2018  Glucose 70 - 99 mg/dL 92 97 116(H)  BUN 8 - 23 mg/dL _0 Creatinine 0.44 - 1.00 mg/dL 0.84 0.64 0.70  Sodium 135 - 145 mmol/L 139 142 137  Potassium 3.5 - 5.1 mmol/L 3.5 3.3(L) 3.6  Chloride 98 - 111 mmol/L 101 102 101  CO2 22 - 32 mmol/L _1 Calcium 8.9 - 10.3 mg/dL 9.1 8.5(L) 8.7(L)  Total Protein 6.5 - 8.1 g/dL 7.1 7.1 6.7  Total Bilirubin 0.3 - 1.2 mg/dL 0.5 0.8 0.4  Alkaline Phos 38 - 126 U/L 170(H) 164(H) 281(H)  AST 15 - 41 U/L 34 35 52(H)  ALT 0 - 44 U/L 22 25 46(H)    Lab Results  Component Value Date   WBC 5.5 07/03/2018   HGB 10.5 (L) 07/03/2018   HCT 32.2 (L) 07/03/2018   MCV 92.5 07/03/2018   PLT 71 (L) 07/03/2018   NEUTROABS 1.5 07/03/2018    ASSESSMENT & PLAN:  Breast cancer of upper-outer quadrant of left female breast ([place]) Left breast invasive ductal carcinomaT2, N1, 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 Was on Arimidex since 08/22/2012 to 03/30/16 Recent treatment: Kadcyla Subcutaneous nodule excisionleft chest: Infiltrating carcinoma breast primary, ER positive, PR negative, HER-2 positive  06/04/2018: CT CAP and Bone scan: Progression of metastatic disease involving  the bone, renal lesion and lung nodule  Recommendation: Switch treatment to the Abemaciclib with letrozole along with Herceptin (to be given every 4 weeks) and Xgeva Recent brain MRI showing dural based metastases: Because the patient is asymptomatic we decided to not perform any procedures at this time.  Abemaciclib toxicities 1.  Episode of diarrhea that lasted for a day 2. emergency room visit because of  tremors and panic attack 3.  Thrombocytopenia: I would like to watch and monitor this for the time being.  If it continues to decline we may have to reduce the dosage.  Return to clinic in a couple of weeks to receive Herceptin. Continue current therapy  No orders of the defined types were placed in this encounter.  The patient has a good understanding of the overall plan. she agrees with it. she will call with any problems that may develop before the next visit here.   Harriette Ohara, MD 07/03/18

## 2018-07-03 NOTE — Patient Instructions (Signed)
Implanted Port Home Guide An implanted port is a type of central line that is placed under the skin. Central lines are used to provide IV access when treatment or nutrition needs to be given through a person's veins. Implanted ports are used for long-term IV access. An implanted port may be placed because:  You need IV medicine that would be irritating to the small veins in your hands or arms.  You need long-term IV medicines, such as antibiotics.  You need IV nutrition for a long period.  You need frequent blood draws for lab tests.  You need dialysis.  Implanted ports are usually placed in the chest area, but they can also be placed in the upper arm, the abdomen, or the leg. An implanted port has two main parts:  Reservoir. The reservoir is round and will appear as a small, raised area under your skin. The reservoir is the part where a needle is inserted to give medicines or draw blood.  Catheter. The catheter is a thin, flexible tube that extends from the reservoir. The catheter is placed into a large vein. Medicine that is inserted into the reservoir goes into the catheter and then into the vein.  How will I care for my incision site? Do not get the incision site wet. Bathe or shower as directed by your health care provider. How is my port accessed? Special steps must be taken to access the port:  Before the port is accessed, a numbing cream can be placed on the skin. This helps numb the skin over the port site.  Your health care provider uses a sterile technique to access the port. ? Your health care provider must put on a mask and sterile gloves. ? The skin over your port is cleaned carefully with an antiseptic and allowed to dry. ? The port is gently pinched between sterile gloves, and a needle is inserted into the port.  Only "non-coring" port needles should be used to access the port. Once the port is accessed, a blood return should be checked. This helps ensure that the port  is in the vein and is not clogged.  If your port needs to remain accessed for a constant infusion, a clear (transparent) bandage will be placed over the needle site. The bandage and needle will need to be changed every week, or as directed by your health care provider.  Keep the bandage covering the needle clean and dry. Do not get it wet. Follow your health care provider's instructions on how to take a shower or bath while the port is accessed.  If your port does not need to stay accessed, no bandage is needed over the port.  What is flushing? Flushing helps keep the port from getting clogged. Follow your health care provider's instructions on how and when to flush the port. Ports are usually flushed with saline solution or a medicine called heparin. The need for flushing will depend on how the port is used.  If the port is used for intermittent medicines or blood draws, the port will need to be flushed: ? After medicines have been given. ? After blood has been drawn. ? As part of routine maintenance.  If a constant infusion is running, the port may not need to be flushed.  How long will my port stay implanted? The port can stay in for as long as your health care provider thinks it is needed. When it is time for the port to come out, surgery will be   done to remove it. The procedure is similar to the one performed when the port was put in. When should I seek immediate medical care? When you have an implanted port, you should seek immediate medical care if:  You notice a bad smell coming from the incision site.  You have swelling, redness, or drainage at the incision site.  You have more swelling or pain at the port site or the surrounding area.  You have a fever that is not controlled with medicine.  This information is not intended to replace advice given to you by your health care provider. Make sure you discuss any questions you have with your health care provider. Document  Released: 10/03/2005 Document Revised: 03/10/2016 Document Reviewed: 06/10/2013 Elsevier Interactive Patient Education  2017 Elsevier Inc.  

## 2018-07-03 NOTE — Progress Notes (Signed)
Completed on 07/03/2018

## 2018-07-04 LAB — URINE CULTURE: Culture: <10000 — AB

## 2018-07-06 ENCOUNTER — Ambulatory Visit: Payer: Medicare Other

## 2018-07-06 ENCOUNTER — Ambulatory Visit: Payer: Medicare Other | Admitting: Hematology and Oncology

## 2018-07-06 ENCOUNTER — Other Ambulatory Visit: Payer: Medicare Other

## 2018-07-12 ENCOUNTER — Other Ambulatory Visit: Payer: Self-pay

## 2018-07-12 DIAGNOSIS — C7951 Secondary malignant neoplasm of bone: Secondary | ICD-10-CM

## 2018-07-13 ENCOUNTER — Telehealth: Payer: Self-pay

## 2018-07-13 ENCOUNTER — Inpatient Hospital Stay (HOSPITAL_BASED_OUTPATIENT_CLINIC_OR_DEPARTMENT_OTHER): Payer: Medicare Other | Admitting: Hematology and Oncology

## 2018-07-13 ENCOUNTER — Inpatient Hospital Stay: Payer: Medicare Other

## 2018-07-13 ENCOUNTER — Encounter: Payer: Self-pay | Admitting: Internal Medicine

## 2018-07-13 ENCOUNTER — Inpatient Hospital Stay (HOSPITAL_BASED_OUTPATIENT_CLINIC_OR_DEPARTMENT_OTHER): Payer: Medicare Other | Admitting: Internal Medicine

## 2018-07-13 VITALS — BP 159/93 | HR 92 | Temp 98.3°F | Resp 16 | Ht 68.0 in | Wt 140.7 lb

## 2018-07-13 DIAGNOSIS — Z170 Estrogen receptor positive status [ER+]: Secondary | ICD-10-CM

## 2018-07-13 DIAGNOSIS — Z9221 Personal history of antineoplastic chemotherapy: Secondary | ICD-10-CM

## 2018-07-13 DIAGNOSIS — C7951 Secondary malignant neoplasm of bone: Secondary | ICD-10-CM | POA: Diagnosis not present

## 2018-07-13 DIAGNOSIS — Z79811 Long term (current) use of aromatase inhibitors: Secondary | ICD-10-CM

## 2018-07-13 DIAGNOSIS — C50412 Malignant neoplasm of upper-outer quadrant of left female breast: Secondary | ICD-10-CM | POA: Diagnosis not present

## 2018-07-13 DIAGNOSIS — Z923 Personal history of irradiation: Secondary | ICD-10-CM

## 2018-07-13 DIAGNOSIS — Z7982 Long term (current) use of aspirin: Secondary | ICD-10-CM

## 2018-07-13 DIAGNOSIS — Z95828 Presence of other vascular implants and grafts: Secondary | ICD-10-CM

## 2018-07-13 DIAGNOSIS — Z5112 Encounter for antineoplastic immunotherapy: Secondary | ICD-10-CM | POA: Diagnosis not present

## 2018-07-13 DIAGNOSIS — K5900 Constipation, unspecified: Secondary | ICD-10-CM

## 2018-07-13 DIAGNOSIS — D6481 Anemia due to antineoplastic chemotherapy: Secondary | ICD-10-CM

## 2018-07-13 DIAGNOSIS — R531 Weakness: Secondary | ICD-10-CM

## 2018-07-13 DIAGNOSIS — C7931 Secondary malignant neoplasm of brain: Secondary | ICD-10-CM

## 2018-07-13 DIAGNOSIS — Z79899 Other long term (current) drug therapy: Secondary | ICD-10-CM

## 2018-07-13 DIAGNOSIS — T451X5A Adverse effect of antineoplastic and immunosuppressive drugs, initial encounter: Secondary | ICD-10-CM

## 2018-07-13 LAB — CMP (CANCER CENTER ONLY)
ALT: 19 U/L (ref 0–44)
AST: 27 U/L (ref 15–41)
Albumin: 3.1 g/dL — ABNORMAL LOW (ref 3.5–5.0)
Alkaline Phosphatase: 143 U/L — ABNORMAL HIGH (ref 38–126)
Anion gap: 9 (ref 5–15)
BUN: 11 mg/dL (ref 8–23)
CO2: 28 mmol/L (ref 22–32)
Calcium: 8.8 mg/dL — ABNORMAL LOW (ref 8.9–10.3)
Chloride: 103 mmol/L (ref 98–111)
Creatinine: 0.8 mg/dL (ref 0.44–1.00)
GFR, Est AFR Am: >60 mL/min (ref >60)
GFR, Estimated: >60 mL/min (ref >60)
Glucose, Bld: 115 mg/dL — ABNORMAL HIGH (ref 70–99)
Potassium: 3.3 mmol/L — ABNORMAL LOW (ref 3.5–5.1)
Sodium: 140 mmol/L (ref 135–145)
Total Bilirubin: 0.4 mg/dL (ref 0.3–1.2)
Total Protein: 7 g/dL (ref 6.5–8.1)

## 2018-07-13 LAB — CBC WITH DIFFERENTIAL (CANCER CENTER ONLY)
Basophils Absolute: 0.1 10*3/uL (ref 0.0–0.1)
Basophils Relative: 2 %
Eosinophils Absolute: 0.1 10*3/uL (ref 0.0–0.5)
Eosinophils Relative: 2 %
HCT: 30.6 % — ABNORMAL LOW (ref 34.8–46.6)
Hemoglobin: 10.3 g/dL — ABNORMAL LOW (ref 11.6–15.9)
Lymphocytes Relative: 52 %
Lymphs Abs: 2.1 10*3/uL (ref 0.9–3.3)
MCH: 31 pg (ref 25.1–34.0)
MCHC: 33.8 g/dL (ref 31.5–36.0)
MCV: 91.8 fL (ref 79.5–101.0)
Monocytes Absolute: 0.3 10*3/uL (ref 0.1–0.9)
Monocytes Relative: 7 %
Neutro Abs: 1.5 10*3/uL (ref 1.5–6.5)
Neutrophils Relative %: 37 %
Platelet Count: 111 10*3/uL — ABNORMAL LOW (ref 145–400)
RBC: 3.33 MIL/uL — ABNORMAL LOW (ref 3.70–5.45)
RDW: 17.9 % — ABNORMAL HIGH (ref 11.2–14.5)
WBC Count: 4 10*3/uL (ref 3.9–10.3)

## 2018-07-13 MED ORDER — SODIUM CHLORIDE 0.9 % IV SOLN
Freq: Once | INTRAVENOUS | Status: AC
  Administered 2018-07-13: 13:00:00 via INTRAVENOUS
  Filled 2018-07-13: qty 250

## 2018-07-13 MED ORDER — ACETAMINOPHEN 325 MG PO TABS
ORAL_TABLET | ORAL | Status: AC
  Filled 2018-07-13: qty 2

## 2018-07-13 MED ORDER — DIPHENHYDRAMINE HCL 25 MG PO CAPS
ORAL_CAPSULE | ORAL | Status: AC
  Filled 2018-07-13: qty 1

## 2018-07-13 MED ORDER — DIPHENHYDRAMINE HCL 25 MG PO CAPS
50.0000 mg | ORAL_CAPSULE | Freq: Once | ORAL | Status: AC
  Administered 2018-07-13: 50 mg via ORAL

## 2018-07-13 MED ORDER — SODIUM CHLORIDE 0.9% FLUSH
10.0000 mL | INTRAVENOUS | Status: DC | PRN
  Administered 2018-07-13: 10 mL
  Filled 2018-07-13: qty 10

## 2018-07-13 MED ORDER — ACETAMINOPHEN 325 MG PO TABS
650.0000 mg | ORAL_TABLET | Freq: Once | ORAL | Status: AC
  Administered 2018-07-13: 650 mg via ORAL

## 2018-07-13 MED ORDER — TRASTUZUMAB CHEMO 150 MG IV SOLR
6.0000 mg/kg | Freq: Once | INTRAVENOUS | Status: AC
  Administered 2018-07-13: 399 mg via INTRAVENOUS
  Filled 2018-07-13: qty 19

## 2018-07-13 MED ORDER — SODIUM CHLORIDE 0.9% FLUSH
10.0000 mL | Freq: Once | INTRAVENOUS | Status: AC
  Administered 2018-07-13: 10 mL
  Filled 2018-07-13: qty 10

## 2018-07-13 MED ORDER — HEPARIN SOD (PORK) LOCK FLUSH 100 UNIT/ML IV SOLN
500.0000 [IU] | Freq: Once | INTRAVENOUS | Status: AC | PRN
  Administered 2018-07-13: 500 [IU]
  Filled 2018-07-13: qty 5

## 2018-07-13 MED FILL — VERZENIO 150 MG TAB: 150 | 28 days supply | Qty: 56 | Fill #1

## 2018-07-13 NOTE — Telephone Encounter (Signed)
Spoke with patient and she is aware of the upcoming appointment with [facility] added to the [facility] appt. Per 9/27 los. Mailed a letter with a calender enclosed

## 2018-07-13 NOTE — Assessment & Plan Note (Signed)
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 Was on Arimidex since 08/22/2012 to 03/30/16 Recent treatment: Kadcyla Subcutaneous nodule excisionleft chest: Infiltrating carcinoma breast primary, ER positive, PR negative, HER-2 positive  06/04/2018: CTCAP andBone scan: Progression of metastatic disease involving the bone, renal lesion and lung nodule  Current treatment:  Abemaciclib with letrozole along with Herceptin(to be given every 4 weeks)and Xgeva  Brain MRI showing dural based metastases: Because the patient is asymptomatic we decided to not perform any procedures at this time.  Abemaciclib toxicities 1.  Episode of diarrhea that lasted for a day 2. emergency room visit because of tremors and panic attack 3.  Thrombocytopenia: I would like to watch and monitor this for the time being.  If it continues to decline we may have to reduce the dosage.  Return to clinic in 1 month for Herceptin and lab count check.

## 2018-07-13 NOTE — Progress Notes (Signed)
Patient Care Team: Bridget Hartshorn, NP as PCP - General (Adult Health Nurse Practitioner)  DIAGNOSIS:  Encounter Diagnoses  Name Primary?  . 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 Malignant neoplasm of upper-outer quadrant of left breast in female, estrogen receptor positive ([place])     SUMMARY OF ONCOLOGIC HISTORY:   Breast cancer of upper-outer quadrant of left female breast ([place])   11/14/2011 Surgery    Bilateral mastectomy, prophylactic on the right, left breast IDC 3/18 lymph nodes positive with extracapsular extension ER 89%, PR 81%, HER-2 negative, Ki-67 79% T2 N1 A. stage IIB    12/13/2011 - 06/28/2012 Chemotherapy    4 cycles of FEC followed by 4 cycles of Taxotere    07/17/2012 - 08/22/2012 Radiation Therapy    Adjuvant radiation therapy    08/22/2012 - 03/16/2016 Anti-estrogen oral therapy    Arimidex 1 mg daily    03/16/2016 Relapse/Recurrence    Subcutaneous nodule excision left chest: Infiltrating carcinoma breast primary, ER positive, PR negative    03/29/2016 Imaging    CT CAP and bone scan: Lytic lesions T8 vertebral, T1 posterior element, subcutaneous nodule left lateral chest wall, nonspecific lung nodules; Bone scan: Mets to kull, left humerus, left eighth rib, T7/T8, sternum, left acetabulum    04/28/2016 - 06/17/2017 Chemotherapy    Herceptin, lapatinib, Faslodex, Zometa every 4 weeks, lapatinib discontinued in September 2018 due to elevation of LFTs    06/22/2017 Relapse/Recurrence    Surgical excision:Soft tissue mass left lateral chest wall primary breast cancer, soft tissue mass left medial chest wall breast cancer, tumor is within the dermis extending to the subcutaneous adipose tissue and involves portions of skeletal muscle    08/2017 - 02/16/2018 Chemotherapy    Faslodex with Herceptin and Perjeta along with Zometa every 4 weeks     03/02/2018 - 05/25/2018 Chemotherapy    Kadcyla     05/11/2018 Imaging    Dural-based metastasis overlying the right  frontoparietal convexity. Associated vasogenic edema within the underlying right cerebral hemisphere without significant midline shift. Signal abnormality throughout the visualized bone marrow, compatible with osseous metastatic disease.       06/04/2018 Imaging    CT CAP: Right lower lobe lung nodule 7 mm (was 5 mm); multiple bone metastases throughout the spine and ribs sternum scapula and humerus, slightly increased lower thoracic mets, right renal lesion 2.5 cm (was 1.2 cm) right femur met increased from 2.1 cm to 2.9 cm    06/15/2018 -  Anti-estrogen oral therapy    Abemaciclib, Herceptin, letrozole, Xgeva     Metastatic breast cancer ([place])   06/29/2017 Initial Diagnosis    Metastatic breast cancer ([place])    06/05/2018 -  Chemotherapy    The patient had trastuzumab (HERCEPTIN) 546 mg in sodium chloride 0.9 % 250 mL chemo infusion, 8 mg/kg = 546 mg, Intravenous,  Once, 1 of 12 cycles Administration: 546 mg (06/15/2018)  for chemotherapy treatment.      CHIEF COMPLIANT: Follow-up on Abemaciclib, Herceptin, letrozole  INTERVAL HISTORY: [name] Riley is a 71-year with above-mentioned history of metastatic breast cancer currently on combination therapy with abemaciclib and letrozole along with Herceptin.  She has had diarrhea related to Abemaciclib but lately the diarrhea has improved significantly.  She occasionally has constipation now.  She has decreased appetite as well as fatigue.  She continues to have the left arm weakness and occasionally she has dropped objects.  REVIEW OF SYSTEMS:   Constitutional: Denies fevers, chills or abnormal  weight loss Eyes: Denies blurriness of vision Ears, nose, mouth, throat, and face: Denies mucositis or sore throat Respiratory: Denies cough, dyspnea or wheezes Cardiovascular: Denies palpitation, chest discomfort Gastrointestinal:  Denies nausea, heartburn or change in bowel habits Skin: Denies abnormal skin rashes Lymphatics: Denies new  lymphadenopathy or easy bruising Neurological:Denies numbness, tingling or new weaknesses Behavioral/Psych: Mood is stable, no new changes  Extremities: No lower extremity edema   All other systems were reviewed with the patient and are negative.  I have reviewed the past medical history, past surgical history, social history and family history with the patient and they are unchanged from previous note.  ALLERGIES:  is allergic to cantaloupe extract allergy skin test; contrast media [iodinated diagnostic agents]; pravastatin; and zosyn [piperacillin sod-tazobactam so].  MEDICATIONS:  Current Outpatient Medications  Medication Sig Dispense Refill  . abemaciclib (VERZENIO) 150 MG tablet Take 1 tablet (150 mg total) by mouth 2 (two) times daily. Swallow tablets whole. Do not chew, crush, or split tablets before swallowing. 56 tablet 1  . acetaminophen (TYLENOL) 500 MG tablet Take 1,000 mg by mouth every 6 (six) hours as needed for moderate pain.    Marland Kitchen aspirin 81 MG tablet Take 81 mg by mouth daily.      . cholecalciferol (VITAMIN D) 1000 units tablet Take 1 tablet (1,000 Units total) by mouth daily.    . diphenhydrAMINE (BENADRYL) 25 MG tablet Take 50 mg by mouth at bedtime as needed for sleep.    Marland Kitchen letrozole (FEMARA) 2.5 MG tablet Take 1 tablet (2.5 mg total) by mouth daily. 90 tablet 3  . levETIRAcetam (KEPPRA) 500 MG tablet Take 1 tablet (500 mg total) by mouth 2 (two) times daily. 60 tablet 3  . levothyroxine (SYNTHROID, LEVOTHROID) 100 MCG tablet Take 100 mcg by mouth daily before breakfast. BRAND ONLY    . lidocaine-prilocaine (EMLA) cream Apply topically as needed (for port access). Apply to affected area once 30 g 3  . metoprolol succinate (TOPROL-XL) 100 MG 24 hr tablet Take 1 tablet (100 mg total) by mouth daily. 90 tablet 3  . Multiple Vitamins-Minerals (MULTIVITAMIN WITH MINERALS) tablet Take 1 tablet by mouth daily.      . ondansetron (ZOFRAN-ODT) 4 MG disintegrating tablet Take 1  tablet (4 mg total) by mouth every 6 (six) hours as needed. (Patient taking differently: Take 4 mg by mouth every 6 (six) hours as needed for nausea or vomiting. ) 20 tablet 3   No current facility-administered medications for this visit.    Facility-Administered Medications Ordered in Other Visits  Medication Dose Route Frequency Provider Last Rate Last Dose  . 0.9 %  sodium chloride infusion   Intravenous Continuous Donnie Mesa, MD      . acetaminophen (TYLENOL) tablet 975 mg  975 mg Oral Q6H Donnie Mesa, MD      . enoxaparin (LOVENOX) injection 40 mg  40 mg Subcutaneous Q24H Donnie Mesa, MD      . morphine 4 MG/ML injection 2-4 mg  2-4 mg Intravenous Q2H PRN Donnie Mesa, MD      . ondansetron (ZOFRAN-ODT) disintegrating tablet 4 mg  4 mg Oral Q6H PRN Donnie Mesa, MD       Or  . ondansetron (ZOFRAN) 4 mg in sodium chloride 0.9 % 50 mL IVPB  4 mg Intravenous Q6H PRN Donnie Mesa, MD      . oxyCODONE (Oxy IR/ROXICODONE) immediate release tablet 5-10 mg  5-10 mg Oral Q4H PRN Donnie Mesa, MD      .  piperacillin-tazobactam (ZOSYN) 3.375 g in dextrose 5 % 50 mL IVPB  3.375 g Intravenous Q8H Donnie Mesa, MD      . prochlorperazine (COMPAZINE) tablet 10 mg  10 mg Oral Q6H PRN Donnie Mesa, MD       Or  . prochlorperazine (COMPAZINE) injection 5-10 mg  5-10 mg Intravenous Q6H PRN Donnie Mesa, MD        PHYSICAL EXAMINATION: ECOG PERFORMANCE STATUS: 1 - Symptomatic but completely ambulatory  Vitals:   07/13/18 1007  BP: (!) 159/93  Pulse: 92  Resp: 16  Temp: 98.3 F (36.8 C)  SpO2: 100%   Filed Weights   07/13/18 1007  Weight: 140 lb 11.2 oz (63.8 kg)    GENERAL:alert, no distress and comfortable SKIN: skin color, texture, turgor are normal, no rashes or significant lesions EYES: normal, Conjunctiva are pink and non-injected, sclera clear OROPHARYNX:no exudate, no erythema and lips, buccal mucosa, and tongue normal  NECK: supple, thyroid normal size,  non-tender, without nodularity LYMPH:  no palpable lymphadenopathy in the cervical, axillary or inguinal LUNGS: clear to auscultation and percussion with normal breathing effort HEART: regular rate & rhythm and no murmurs and no lower extremity edema ABDOMEN:abdomen soft, non-tender and normal bowel sounds MUSCULOSKELETAL:no cyanosis of digits and no clubbing  NEURO: alert & oriented x 3 with fluent speech, no focal motor/sensory deficits EXTREMITIES: No lower extremity edema   LABORATORY DATA:  I have reviewed the data as listed CMP Latest Ref Rng & Units 07/03/2018 07/01/2018 06/15/2018  Glucose 70 - 99 mg/dL 92 97 116(H)  BUN 8 - 23 mg/dL 11 11 11   Creatinine 0.44 - 1.00 mg/dL 0.84 0.64 0.70  Sodium 135 - 145 mmol/L 139 142 137  Potassium 3.5 - 5.1 mmol/L 3.5 3.3(L) 3.6  Chloride 98 - 111 mmol/L 101 102 101  CO2 22 - 32 mmol/L 30 28 27   Calcium 8.9 - 10.3 mg/dL 9.1 8.5(L) 8.7(L)  Total Protein 6.5 - 8.1 g/dL 7.1 7.1 6.7  Total Bilirubin 0.3 - 1.2 mg/dL 0.5 0.8 0.4  Alkaline Phos 38 - 126 U/L 170(H) 164(H) 281(H)  AST 15 - 41 U/L 34 35 52(H)  ALT 0 - 44 U/L 22 25 46(H)    Lab Results  Component Value Date   WBC 4.0 07/13/2018   HGB 10.3 (L) 07/13/2018   HCT 30.6 (L) 07/13/2018   MCV 91.8 07/13/2018   PLT 111 (L) 07/13/2018   NEUTROABS 1.5 07/13/2018    ASSESSMENT & PLAN:  Breast cancer of upper-outer quadrant of left female breast ([place]) Left breast invasive ductal carcinomaT2, N1, 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 Was on Arimidex since 08/22/2012 to 03/30/16 Recent treatment: Kadcyla Subcutaneous nodule excisionleft chest: Infiltrating carcinoma breast primary, ER positive, PR negative, HER-2 positive  06/04/2018: CTCAP andBone scan: Progression of metastatic disease involving the bone, renal lesion and lung  nodule  Current treatment:  Abemaciclib with letrozole along with Herceptin(to be given every 4 weeks)and Xgeva  Brain MRI showing dural based metastases: Because the patient is asymptomatic we decided to not perform any procedures at this time.  Abemaciclib toxicities 1.    Diarrhea alternating with constipation 2. emergency room visit because of tremors and panic attack: No further issues 3.  Thrombocytopenia: Platelet count is increased to 111 4.  Anemia due to therapy: Hemoglobin is 10.3  and we are watching and monitoring it.  I discussed with her that the patient has many options for treatment including oral Neratinib or lapatinib.  There are in addition several new drugs that are going to be approved for HER-2 positive metastatic breast cancer. Delton See will need to be given next month Return to clinic in 1 month for Herceptin and lab count check.    No orders of the defined types were placed in this encounter.  The patient has a good understanding of the overall plan. she agrees with it. she will call with any problems that may develop before the next visit here.   Harriette Ohara, MD 07/13/18

## 2018-07-13 NOTE — Progress Notes (Signed)
[facility] [place], [place] [address] [phone]   New Patient Evaluation  Date of Service: 07/13/18 Patient Name: [name] Riley Patient MRN: [id number] Patient DOB: 16-Oct-1951 Provider: Ventura Sellers, MD  Identifying Statement:  [name] Riley is a 67 y.o. female with seizures, weakness who presents for initial consultation and evaluation regarding cancer associated neurologic deficits.    Referring Provider: [name], MD [address] [place], [place] [address]  Primary Cancer:  Oncologic History:   Breast cancer of upper-outer quadrant of left female breast ([place])   11/14/2011 Surgery    Bilateral mastectomy, prophylactic on the right, left breast IDC 3/18 lymph nodes positive with extracapsular extension ER 89%, PR 81%, HER-2 negative, Ki-67 79% T2 N1 A. stage IIB    12/13/2011 - 06/28/2012 Chemotherapy    4 cycles of FEC followed by 4 cycles of Taxotere    07/17/2012 - 08/22/2012 Radiation Therapy    Adjuvant radiation therapy    08/22/2012 - 03/16/2016 Anti-estrogen oral therapy    Arimidex 1 mg daily    03/16/2016 Relapse/Recurrence    Subcutaneous nodule excision left chest: Infiltrating carcinoma breast primary, ER positive, PR negative    03/29/2016 Imaging    CT CAP and bone scan: Lytic lesions T8 vertebral, T1 posterior element, subcutaneous nodule left lateral chest wall, nonspecific lung nodules; Bone scan: Mets to kull, left humerus, left eighth rib, T7/T8, sternum, left acetabulum    04/28/2016 - 06/17/2017 Chemotherapy    Herceptin, lapatinib, Faslodex, Zometa every 4 weeks, lapatinib discontinued in September 2018 due to elevation of LFTs    06/22/2017 Relapse/Recurrence    Surgical excision:Soft tissue mass left lateral chest wall primary breast cancer, soft tissue mass left medial chest wall breast cancer, tumor is within the dermis extending to the subcutaneous adipose tissue and involves portions  of skeletal muscle    08/2017 - 02/16/2018 Chemotherapy    Faslodex with Herceptin and Perjeta along with Zometa every 4 weeks     03/02/2018 - 05/25/2018 Chemotherapy    Kadcyla     05/11/2018 Imaging    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 Associated vasogenic edema within the underlying right cerebral hemisphere without significant midline shift. Signal abnormality throughout the visualized bone marrow, compatible with osseous metastatic disease.       06/04/2018 Imaging    CT CAP: Right lower lobe lung nodule 7 mm (was 5 mm); multiple bone metastases throughout the spine and ribs sternum scapula and humerus, slightly increased lower thoracic mets, right renal lesion 2.5 cm (was 1.2 cm) right femur met increased from 2.1 cm to 2.9 cm    06/15/2018 -  Anti-estrogen oral therapy    Abemaciclib, Herceptin, letrozole, Xgeva     Metastatic breast cancer ([place])   06/29/2017 Initial Diagnosis    Metastatic breast cancer ([place])    06/05/2018 -  Chemotherapy    The patient had trastuzumab (HERCEPTIN) 546 mg in sodium chloride 0.9 % 250 mL chemo infusion, 8 mg/kg = 546 mg, Intravenous,  Once, 2 of 12 cycles Administration: 546 mg (06/15/2018), 399 mg (07/13/2018)  for chemotherapy treatment.      History of Present Illness: The patient's records from the referring physician were obtained and reviewed and the patient interviewed to confirm this HPI.  [name] Riley presented to neurologic attention at the end of July, when she experienced an episode of sudden onset involuntary shaking of left hand.  This persisted  for several minutes and was followed by post-even weakness and clumsiness of the hand.  She went to the hospital, where CNS imaging demonstrated an enhancing dural based mass with adjacent cerebral edema.  Since that time her hand has improved marginally, but she is still clumsy with activities such as tying her shoes.  Recently progressed on Kadcyla and was  started on Abemaciclib therapy with Dr. Lindi Adie.   Medications: Current Outpatient Medications on File Prior to Visit  Medication Sig Dispense Refill  . abemaciclib (VERZENIO) 150 MG tablet Take 1 tablet (150 mg total) by mouth 2 (two) times daily. Swallow tablets whole. Do not chew, crush, or split tablets before swallowing. 56 tablet 1  . acetaminophen (TYLENOL) 500 MG tablet Take 1,000 mg by mouth every 6 (six) hours as needed for moderate pain.    Marland Kitchen aspirin 81 MG tablet Take 81 mg by mouth daily.      . cholecalciferol (VITAMIN D) 1000 units tablet Take 1 tablet (1,000 Units total) by mouth daily.    . diphenhydrAMINE (BENADRYL) 25 MG tablet Take 50 mg by mouth at bedtime as needed for sleep.    Marland Kitchen letrozole (FEMARA) 2.5 MG tablet Take 1 tablet (2.5 mg total) by mouth daily. 90 tablet 3  . levETIRAcetam (KEPPRA) 500 MG tablet Take 1 tablet (500 mg total) by mouth 2 (two) times daily. 60 tablet 3  . levothyroxine (SYNTHROID, LEVOTHROID) 100 MCG tablet Take 100 mcg by mouth daily before breakfast. BRAND ONLY    . lidocaine-prilocaine (EMLA) cream Apply topically as needed (for port access). Apply to affected area once 30 g 3  . metoprolol succinate (TOPROL-XL) 100 MG 24 hr tablet Take 1 tablet (100 mg total) by mouth daily. 90 tablet 3  . Multiple Vitamins-Minerals (MULTIVITAMIN WITH MINERALS) tablet Take 1 tablet by mouth daily.      . ondansetron (ZOFRAN-ODT) 4 MG disintegrating tablet Take 1 tablet (4 mg total) by mouth every 6 (six) hours as needed. (Patient taking differently: Take 4 mg by mouth every 6 (six) hours as needed for nausea or vomiting. ) 20 tablet 3   Current Facility-Administered Medications on File Prior to Visit  Medication Dose Route Frequency Provider Last Rate Last Dose  . 0.9 %  sodium chloride infusion   Intravenous Continuous Donnie Mesa, MD      . acetaminophen (TYLENOL) tablet 975 mg  975 mg Oral Q6H Donnie Mesa, MD      . enoxaparin (LOVENOX) injection 40 mg   40 mg Subcutaneous Q24H Donnie Mesa, MD      . morphine 4 MG/ML injection 2-4 mg  2-4 mg Intravenous Q2H PRN Donnie Mesa, MD      . ondansetron (ZOFRAN-ODT) disintegrating tablet 4 mg  4 mg Oral Q6H PRN Donnie Mesa, MD       Or  . ondansetron (ZOFRAN) 4 mg in sodium chloride 0.9 % 50 mL IVPB  4 mg Intravenous Q6H PRN Donnie Mesa, MD      . oxyCODONE (Oxy IR/ROXICODONE) immediate release tablet 5-10 mg  5-10 mg Oral Q4H PRN Donnie Mesa, MD      . piperacillin-tazobactam (ZOSYN) 3.375 g in dextrose 5 % 50 mL IVPB  3.375 g Intravenous Q8H Donnie Mesa, MD      . prochlorperazine (COMPAZINE) tablet 10 mg  10 mg Oral Q6H PRN Donnie Mesa, MD       Or  . prochlorperazine (COMPAZINE) injection 5-10 mg  5-10 mg Intravenous Q6H PRN Donnie Mesa, MD  Allergies:  Allergies  Allergen Reactions  . Cantaloupe Extract Allergy Skin Test Shortness Of Breath  . Contrast Media [Iodinated Diagnostic Agents] Shortness Of Breath and Rash  . Pravastatin Other (See Comments)    Legs hurt  . Zosyn [Piperacillin Sod-Tazobactam So] Rash and Other (See Comments)    Temperature increase, facial flushing   Past Medical History:  Past Medical History:  Diagnosis Date  . Allergy   . Breast cancer ([place]) 08/25/2011   L , invasive ductal carcinoma, ER/PR +,HER2 -  . Cancer [facility])    left breast cancer  . Coronary artery disease 2001  . Heart attack [facility]) 09/2000   Sep 25, 2000  --no intervention  . History of chemotherapy comp. 08/22/2012   4 cycles of FEC and $ cycles of Taxotere  . Hyperlipidemia   . Hypertension   . Hypothyroidism   . PONV (postoperative nausea and vomiting)    gets sick from anesthesia  . Status post radiation therapy 07/09/12 - 08/22/2012   Left Breast, 60.4 gray   Past Surgical History:  Past Surgical History:  Procedure Laterality Date  . ABDOMINAL HYSTERECTOMY  1998   TAH, oophorectomy  . APPENDECTOMY  1970  . BREAST SURGERY  1998   removal of benign  lump in rt breast  . BREAST SURGERY  11/14/11   right simple mastectomy, left mrm  . INCISION AND DRAINAGE OF WOUND Left 07/01/2017   Procedure: IRRIGATION AND DEBRIDEMENT CHEST WALL ABSCESS;  Surgeon: Donnie Mesa, MD;  Location: [facility];  Service: General;  Laterality: Left;  Marland Kitchen MASS EXCISION Left 06/22/2017   Procedure: EXCISION OF CHEST WALL MASSES;  Surgeon: Donnie Mesa, MD;  Location: [facility];  Service: General;  Laterality: Left;  Marland Kitchen MASTECTOMY Bilateral    for left breast cancer  . OVARIAN CYST SURGERY Right 1970  . PORT-A-CATH REMOVAL  08/30/2012   Procedure: REMOVAL PORT-A-CATH;  Surgeon: Imogene Burn. [name], MD;  Location: [facility];  Service: General;  Laterality: Right;  port removal  . PORTACATH PLACEMENT  11/14/2011   Procedure: INSERTION PORT-A-CATH;  Surgeon: Imogene Burn. [name], MD;  Location: [facility];  Service: General;  Laterality: Right;  . PORTACATH PLACEMENT N/A 04/25/2016   Procedure: INSERTION PORT-A-CATH LEFT CHEST;  Surgeon: Donnie Mesa, MD;  Location: [facility];  Service: General;  Laterality: N/A;  . skin tags  05/09/1997   left axillary left neck skin tags  . TONSILLECTOMY  1968   Social History:  Social History   Socioeconomic History  . Marital status: Married    Spouse name: Not on file  . Number of children: 3  . Years of education: Not on file  . Highest education level: Not on file  Occupational History  . Occupation: Works at [employer]  . Financial resource strain: Not on file  . Food insecurity:    Worry: Not on file    Inability: Not on file  . Transportation needs:    Medical: Not on file    Non-medical: Not on file  Tobacco Use  . Smoking status: Never Smoker  . Smokeless tobacco: Never Used  Substance and Sexual Activity  . Alcohol use: No  . Drug use: No  . Sexual activity: Yes    Birth control/protection: Surgical    Comment: menarche 63, Parity age 88, G40, P3, 1 miscarriage,  HRT x  5-10 yrs, Mild Hot Flashes  Lifestyle  . Physical activity:    Days  per week: Not on file    Minutes per session: Not on file  . Stress: Not on file  Relationships  . Social connections:    Talks on phone: Not on file    Gets together: Not on file    Attends religious service: Not on file    Active member of club or organization: Not on file    Attends meetings of clubs or organizations: Not on file    Relationship status: Not on file  . Intimate partner violence:    Fear of current or ex partner: Not on file    Emotionally abused: Not on file    Physically abused: Not on file    Forced sexual activity: Not on file  Other Topics Concern  . Not on file  Social History Narrative   Lives at home.   Family History:  Family History  Problem Relation Age of Onset  . Hypertension Maternal Grandmother   . Diabetes Maternal Grandmother   . Cancer Father 16       lung cancer and Prostate Cancer  . Hypertension Mother   . Cancer Paternal Aunt        ovarian  . Cancer Cousin        breast, paternal cousin  . Cancer Paternal Uncle        stomach  . Cancer Paternal Grandfather        Esophagus  . Colon cancer Neg Hx     Review of Systems: Constitutional: Denies fevers, chills or abnormal weight loss Eyes: Denies blurriness of vision Ears, nose, mouth, throat, and face: Denies mucositis or sore throat Respiratory: Denies cough, dyspnea or wheezes Cardiovascular: Denies palpitation, chest discomfort or lower extremity swelling Gastrointestinal:  Denies nausea, constipation, diarrhea GU: Denies dysuria or incontinence Skin: Denies abnormal skin rashes Neurological: Per HPI Musculoskeletal: Denies joint pain, back or neck discomfort. No decrease in ROM Behavioral/Psych: Denies anxiety, disturbance in thought content, and mood instability   Physical Exam: Vitals:   07/13/18 1149  BP: (!) 147/86  Pulse: 87  Resp: 16  Temp: 98.2 F (36.8 C)  SpO2: 98%   KPS:  80. General: Alert, cooperative, pleasant, in no acute distress Head: Craniotomy scar noted, dry and intact. EENT: No conjunctival injection or scleral icterus. Oral mucosa moist Lungs: Resp effort normal Cardiac: Regular rate and rhythm Abdomen: Soft, non-distended abdomen Skin: No rashes cyanosis or petechiae. Extremities: No clubbing or edema  Neurologic Exam: Mental Status: Awake, alert, attentive to examiner. Oriented to self and environment. Language is fluent with intact comprehension.  Cranial Nerves: Visual acuity is grossly normal. Visual fields are full. Extra-ocular movements intact. No ptosis. Face is symmetric, tongue midline. Motor: Tone and bulk are normal. Impaired fine motor function in left hand. Reflexes are symmetric, no pathologic reflexes present. Intact finger to nose bilaterally Sensory: Sensory impairment left hand knob Gait: Normal and tandem gait is normal.   Labs: I have reviewed the data as listed    Component Value Date/Time   NA 140 07/13/2018 0925   NA 140 09/22/2017 0958   K 3.3 (L) 07/13/2018 0925   K 3.7 09/22/2017 0958   CL 103 07/13/2018 0925   CL 105 08/10/2012 0908   CO2 28 07/13/2018 0925   CO2 26 09/22/2017 0958   GLUCOSE 115 (H) 07/13/2018 0925   GLUCOSE 80 09/22/2017 0958   GLUCOSE 81 08/10/2012 0908   BUN 11 07/13/2018 0925   BUN 12.8 09/22/2017 0958   CREATININE 0.80 07/13/2018  5284   CREATININE 0.6 09/22/2017 0958   CALCIUM 8.8 (L) 07/13/2018 0925   CALCIUM 9.1 09/22/2017 0958   PROT 7.0 07/13/2018 0925   PROT 7.0 09/22/2017 0958   ALBUMIN 3.1 (L) 07/13/2018 0925   ALBUMIN 4.0 09/22/2017 0958   AST 27 07/13/2018 0925   AST 37 (H) 09/22/2017 0958   ALT 19 07/13/2018 0925   ALT 31 09/22/2017 0958   ALKPHOS 143 (H) 07/13/2018 0925   ALKPHOS 140 09/22/2017 0958   BILITOT 0.4 07/13/2018 0925   BILITOT 0.41 09/22/2017 0958   GFRNONAA >60 07/13/2018 0925   GFRAA >60 07/13/2018 0925   Lab Results  Component Value Date    WBC 4.0 07/13/2018   NEUTROABS 1.5 07/13/2018   HGB 10.3 (L) 07/13/2018   HCT 30.6 (L) 07/13/2018   MCV 91.8 07/13/2018   PLT 111 (L) 07/13/2018    Imaging:  CLINICAL DATA:  Initial evaluation for numbness in left arm. History of metastatic breast cancer.  EXAM: MRI HEAD WITHOUT AND WITH CONTRAST  MRA HEAD WITHOUT CONTRAST  TECHNIQUE: Multiplanar, multiecho pulse sequences of the brain and surrounding structures were obtained without and with intravenous contrast. Angiographic images of the head were obtained using MRA technique without contrast.  CONTRAST:  5m MULTIHANCE GADOBENATE DIMEGLUMINE 529 MG/ML IV SOLN  COMPARISON:  Comparison made with prior head CT from earlier same day as well as prior bone scan from 02/05/2018  FINDINGS: MRI HEAD FINDINGS  Brain: Cerebral volume within normal limits. No significant cerebral white matter disease for age. No evidence for acute or subacute infarct. No encephalomalacia to suggest chronic infarction no evidence for acute or chronic intracranial hemorrhage.  There is abnormal nodular dural thickening and enhancement overlying the right frontoparietal convexity near the vertex, concerning for dural base metastasis. Nodular area measures approximately 5.1 cm in length (series 21, image 7). Tumor measures 12 mm in maximal thickness (series 19, image 10). Adjacent dura overlying the right cerebral convexity is diffusely thickened with abnormal enhancement (series 19, image 12). Associated vasogenic edema within the underlying high right frontoparietal region without significant midline shift (series 14, image 39).  No other evidence for intracranial metastatic disease. No other mass lesion or mass effect. No midline shift. No hydrocephalus. No extra-axial fluid collection. Pituitary gland within normal limits.  Vascular: Major intracranial vascular flow voids are maintained  Skull and upper cervical spine:  Craniocervical junction within normal limits. Decreased T1/T2 hypointensity seen throughout the bone marrow of the upper cervical spine, clivus, with scattered involvement of the calvaria, most consistent with osseous metastases. No scalp soft tissue abnormality.  Sinuses/Orbits: Globes and orbital soft tissues within normal limits. Mild scattered mucosal thickening throughout the ethmoidal air cells and maxillary sinuses. Left maxillary sinus retention cyst noted. No mastoid effusion. Inner ear structures normal.  Other: None.  MRA HEAD FINDINGS  ANTERIOR CIRCULATION:  Visualized distal cervical segments of the internal carotid arteries are patent with antegrade flow. Petrous, cavernous, and supraclinoid segments patent without hemodynamically significant stenosis. Apparent defect at the anterior genu of the cavernous ICAs bilaterally favored to be artifactual. Origin of the ophthalmic arteries patent bilaterally. ICA termini widely patent. A1 segments, anterior communicating artery, and anterior cerebral arteries widely patent. M1 segments patent without stenosis. Distal MCA branches well perfused and symmetric.  POSTERIOR CIRCULATION:  Vertebral arteries widely patent to the vertebrobasilar junction without stenosis. Left vertebral artery dominant. Right PICA patent. Left PICA not visualized. Basilar widely patent to its distal aspect. Superior cerebellar arteries  patent bilaterally. Right PCA supplied via the basilar. Fetal type origin of the left PCA. PCAs widely patent to their distal aspects.  No intracranial aneurysm.  IMPRESSION: MRI HEAD IMPRESSION:  1. Dural-based metastasis overlying the right frontoparietal convexity as above. Associated vasogenic edema within the underlying right cerebral hemisphere without significant midline shift. 2. Signal abnormality throughout the visualized bone marrow, compatible with osseous metastatic disease. 3. No  other acute intracranial abnormality. No evidence for acute infarct.  MRA HEAD IMPRESSION:  Normal intracranial MRA.   Electronically Signed   By: Jeannine Boga M.D.   On: 05/11/2018 19:39   Assessment/Plan 1. Brain metastasis [facility])  Ms. Pla is clinically stable today.  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    She should continue Keppra 518m BID.  We did epilepsy counseling today.  She will call [place] any further seizures.  We recommend she repeat MRI brain in 1 month to assess candidacy for radiation or other local therapy if needed.  Because her lesion is on the systemic side of the blood/brain barrier there may be some efficacy of her systemic chemotherapy.  We also offerred to set up neurologic rehab/PT.  We spent twenty additional minutes teaching regarding the natural history, biology, and historical experience in the treatment of neurologic complications of cancer. We also provided teaching sheets for the patient to take home as an additional resource.  We appreciate the opportunity to participate in the care of CSherria RiemannPriddy.  She should return in 1 month with MRI brain for review.  All questions were answered. The patient knows to call the clinic with any problems, questions or concerns. No barriers to learning were detected.  The total time spent in the encounter was 40 minutes and more than 50% was on counseling and review of test results   ZVentura Sellers MD Medical Director of Neuro-Oncology [facility]09/27/19 3:14 PM

## 2018-07-16 ENCOUNTER — Other Ambulatory Visit: Payer: Self-pay | Admitting: *Deleted

## 2018-07-16 DIAGNOSIS — C7931 Secondary malignant neoplasm of brain: Secondary | ICD-10-CM

## 2018-07-27 ENCOUNTER — Other Ambulatory Visit: Payer: Medicare Other

## 2018-07-27 ENCOUNTER — Ambulatory Visit: Payer: Medicare Other

## 2018-07-27 ENCOUNTER — Ambulatory Visit: Payer: Medicare Other | Admitting: Hematology and Oncology

## 2018-08-03 ENCOUNTER — Other Ambulatory Visit: Payer: Self-pay | Admitting: Hematology and Oncology

## 2018-08-03 DIAGNOSIS — Z170 Estrogen receptor positive status [ER+]: Secondary | ICD-10-CM

## 2018-08-03 DIAGNOSIS — C50412 Malignant neoplasm of upper-outer quadrant of left female breast: Secondary | ICD-10-CM

## 2018-08-06 ENCOUNTER — Ambulatory Visit (HOSPITAL_COMMUNITY)
Admission: RE | Admit: 2018-08-06 | Discharge: 2018-08-06 | Disposition: A | Discharge status: Home / Self Care | Payer: Medicare Other | Source: Ambulatory Visit | Attending: Internal Medicine | Admitting: Internal Medicine

## 2018-08-06 ENCOUNTER — Ambulatory Visit (HOSPITAL_COMMUNITY): Payer: Medicare Other

## 2018-08-06 DIAGNOSIS — C7931 Secondary malignant neoplasm of brain: Secondary | ICD-10-CM | POA: Insufficient documentation

## 2018-08-06 LAB — CREATININE, SERUM
Creatinine, Ser: 0.74 mg/dL (ref 0.44–1.00)
GFR calc Af Amer: >60 mL/min (ref >60)
GFR calc non Af Amer: >60 mL/min (ref >60)

## 2018-08-06 MED ORDER — LIDOCAINE-PRILOCAINE 2.5-2.5 % EX CREA
TOPICAL_CREAM | CUTANEOUS | Status: AC
  Administered 2018-08-06: 10:00:00
  Filled 2018-08-06: qty 5

## 2018-08-06 MED ORDER — GADOBUTROL 1 MMOL/ML IV SOLN
6.5000 mL | Freq: Once | INTRAVENOUS | Status: AC | PRN
  Administered 2018-08-06: 6.5 mL via INTRAVENOUS

## 2018-08-06 MED ORDER — HEPARIN SOD (PORK) LOCK FLUSH 100 UNIT/ML IV SOLN
500.0000 [IU] | INTRAVENOUS | Status: AC | PRN
  Administered 2018-08-06: 500 [IU]

## 2018-08-06 NOTE — Progress Notes (Signed)
VAST RN accessed pt's port with 20G PHN; initially good blood return and flushed easily. However, upon attempting to collect labs, no blood return noted; needle rotated and manipulated without BR. Deaccessed port. Port reaccessed using 19G PHN; good blood return and flushed easily. Secured in place and labs collected. Pt later stated in the past, she has had to move her shoulder and reposition her back to get blood return. VAST RN educated pt to verbalize to any staff who are having difficulty with her port drawing blood, that her port is positional and sometimes requires movement of her shoulder or repositioning of her back.

## 2018-08-06 NOTE — Progress Notes (Signed)
VAST RN to MRI holding room 2. Pt verbalized she did not want to be stuck without numbing cream. VAST RN had MRI staff obtain EMLA from Pyxis and placed over pt's port on left chest @ 0953. Explained to pt, VAST RN would return in 30 mins to an hour to access port for labs and MRI. Pt mentioned she previously had an allergic reaction to IV contrast in the 1970's and her MD usually prescribes Prednisone and Benadryl prior to studies. She verbalized this study was ordered by a different physician and no medications were ordered. MRI staff spoke with pt and determined that she was allergic to dye used with CT. MRI staff advised pt that contrast used with MRI is different and asked if she had any issues with MRI in July 2019. Pt stated it was an emergency MRI completed at [facility] and she couldn't remember.

## 2018-08-09 ENCOUNTER — Other Ambulatory Visit: Payer: Self-pay | Admitting: Radiation Therapy

## 2018-08-09 ENCOUNTER — Other Ambulatory Visit: Payer: Self-pay

## 2018-08-09 DIAGNOSIS — C50412 Malignant neoplasm of upper-outer quadrant of left female breast: Secondary | ICD-10-CM

## 2018-08-10 ENCOUNTER — Ambulatory Visit: Payer: Medicare Other | Admitting: Internal Medicine

## 2018-08-10 ENCOUNTER — Other Ambulatory Visit: Payer: Medicare Other

## 2018-08-10 ENCOUNTER — Inpatient Hospital Stay: Payer: Medicare Other

## 2018-08-10 ENCOUNTER — Inpatient Hospital Stay: Payer: Medicare Other | Attending: Hematology and Oncology | Admitting: Hematology and Oncology

## 2018-08-10 ENCOUNTER — Encounter: Payer: Self-pay | Admitting: Internal Medicine

## 2018-08-10 ENCOUNTER — Inpatient Hospital Stay: Payer: Medicare Other | Admitting: Internal Medicine

## 2018-08-10 VITALS — BP 172/108 | HR 87 | Temp 98.5°F | Resp 18 | Ht 68.0 in | Wt 141.5 lb

## 2018-08-10 VITALS — BP 158/92 | HR 87 | Temp 98.5°F | Resp 18 | Ht 68.0 in | Wt 141.5 lb

## 2018-08-10 DIAGNOSIS — C7931 Secondary malignant neoplasm of brain: Secondary | ICD-10-CM

## 2018-08-10 DIAGNOSIS — C50412 Malignant neoplasm of upper-outer quadrant of left female breast: Secondary | ICD-10-CM | POA: Insufficient documentation

## 2018-08-10 DIAGNOSIS — Z5112 Encounter for antineoplastic immunotherapy: Secondary | ICD-10-CM | POA: Insufficient documentation

## 2018-08-10 DIAGNOSIS — T451X5A Adverse effect of antineoplastic and immunosuppressive drugs, initial encounter: Secondary | ICD-10-CM | POA: Insufficient documentation

## 2018-08-10 DIAGNOSIS — C7951 Secondary malignant neoplasm of bone: Secondary | ICD-10-CM

## 2018-08-10 DIAGNOSIS — Z170 Estrogen receptor positive status [ER+]: Secondary | ICD-10-CM

## 2018-08-10 DIAGNOSIS — Z923 Personal history of irradiation: Secondary | ICD-10-CM | POA: Diagnosis not present

## 2018-08-10 DIAGNOSIS — Z7982 Long term (current) use of aspirin: Secondary | ICD-10-CM | POA: Diagnosis not present

## 2018-08-10 DIAGNOSIS — Z95828 Presence of other vascular implants and grafts: Secondary | ICD-10-CM

## 2018-08-10 DIAGNOSIS — Z23 Encounter for immunization: Secondary | ICD-10-CM

## 2018-08-10 DIAGNOSIS — Z79899 Other long term (current) drug therapy: Secondary | ICD-10-CM | POA: Insufficient documentation

## 2018-08-10 DIAGNOSIS — D696 Thrombocytopenia, unspecified: Secondary | ICD-10-CM | POA: Insufficient documentation

## 2018-08-10 DIAGNOSIS — Z9013 Acquired absence of bilateral breasts and nipples: Secondary | ICD-10-CM | POA: Diagnosis not present

## 2018-08-10 DIAGNOSIS — Z9221 Personal history of antineoplastic chemotherapy: Secondary | ICD-10-CM | POA: Insufficient documentation

## 2018-08-10 DIAGNOSIS — D6481 Anemia due to antineoplastic chemotherapy: Secondary | ICD-10-CM | POA: Diagnosis not present

## 2018-08-10 DIAGNOSIS — C50919 Malignant neoplasm of unspecified site of unspecified female breast: Secondary | ICD-10-CM

## 2018-08-10 DIAGNOSIS — G629 Polyneuropathy, unspecified: Secondary | ICD-10-CM | POA: Diagnosis not present

## 2018-08-10 LAB — CMP (CANCER CENTER ONLY)
ALT: 25 U/L (ref 0–44)
AST: 40 U/L (ref 15–41)
Albumin: 3.5 g/dL (ref 3.5–5.0)
Alkaline Phosphatase: 132 U/L — ABNORMAL HIGH (ref 38–126)
Anion gap: 9 (ref 5–15)
BUN: 10 mg/dL (ref 8–23)
CO2: 28 mmol/L (ref 22–32)
Calcium: 9.7 mg/dL (ref 8.9–10.3)
Chloride: 104 mmol/L (ref 98–111)
Creatinine: 0.78 mg/dL (ref 0.44–1.00)
GFR, Est AFR Am: >60 mL/min (ref >60)
GFR, Estimated: >60 mL/min (ref >60)
Glucose, Bld: 101 mg/dL — ABNORMAL HIGH (ref 70–99)
Potassium: 3.4 mmol/L — ABNORMAL LOW (ref 3.5–5.1)
Sodium: 141 mmol/L (ref 135–145)
Total Bilirubin: 0.4 mg/dL (ref 0.3–1.2)
Total Protein: 7.1 g/dL (ref 6.5–8.1)

## 2018-08-10 LAB — CBC WITH DIFFERENTIAL (CANCER CENTER ONLY)
Abs Immature Granulocytes: 0.01 10*3/uL (ref 0.00–0.07)
Basophils Absolute: 0.1 10*3/uL (ref 0.0–0.1)
Basophils Relative: 2 %
Eosinophils Absolute: 0 10*3/uL (ref 0.0–0.5)
Eosinophils Relative: 1 %
HCT: 30 % — ABNORMAL LOW (ref 36.0–46.0)
Hemoglobin: 9.9 g/dL — ABNORMAL LOW (ref 12.0–15.0)
Immature Granulocytes: 0 %
Lymphocytes Relative: 51 %
Lymphs Abs: 2.2 10*3/uL (ref 0.7–4.0)
MCH: 31.5 pg (ref 26.0–34.0)
MCHC: 33 g/dL (ref 30.0–36.0)
MCV: 95.5 fL (ref 80.0–100.0)
Monocytes Absolute: 0.4 10*3/uL (ref 0.1–1.0)
Monocytes Relative: 9 %
Neutro Abs: 1.5 10*3/uL — ABNORMAL LOW (ref 1.7–7.7)
Neutrophils Relative %: 37 %
Platelet Count: 149 10*3/uL — ABNORMAL LOW (ref 150–400)
RBC: 3.14 MIL/uL — ABNORMAL LOW (ref 3.87–5.11)
RDW: 18.4 % — ABNORMAL HIGH (ref 11.5–15.5)
WBC Count: 4.2 10*3/uL (ref 4.0–10.5)
nRBC: 0 % (ref 0.0–0.2)

## 2018-08-10 MED ORDER — DIPHENHYDRAMINE HCL 25 MG PO CAPS
ORAL_CAPSULE | ORAL | Status: AC
  Filled 2018-08-10: qty 2

## 2018-08-10 MED ORDER — SODIUM CHLORIDE 0.9% FLUSH
10.0000 mL | Freq: Once | INTRAVENOUS | Status: AC
  Administered 2018-08-10: 10 mL
  Filled 2018-08-10: qty 10

## 2018-08-10 MED ORDER — HEPARIN SOD (PORK) LOCK FLUSH 100 UNIT/ML IV SOLN
500.0000 [IU] | Freq: Once | INTRAVENOUS | Status: AC | PRN
  Administered 2018-08-10: 500 [IU]
  Filled 2018-08-10: qty 5

## 2018-08-10 MED ORDER — PREDNISONE 20 MG PO TABS: 20.0000 mg | ORAL_TABLET | Freq: Every day | ORAL | 0 refills | Status: DC

## 2018-08-10 MED ORDER — DENOSUMAB 120 MG/1.7ML ~~LOC~~ SOLN
120.0000 mg | Freq: Once | SUBCUTANEOUS | Status: AC
  Administered 2018-08-10: 120 mg via SUBCUTANEOUS

## 2018-08-10 MED ORDER — SODIUM CHLORIDE 0.9% FLUSH
10.0000 mL | INTRAVENOUS | Status: DC | PRN
  Administered 2018-08-10: 10 mL
  Filled 2018-08-10: qty 10

## 2018-08-10 MED ORDER — DENOSUMAB 120 MG/1.7ML ~~LOC~~ SOLN
SUBCUTANEOUS | Status: AC
  Filled 2018-08-10: qty 1.7

## 2018-08-10 MED ORDER — DIPHENHYDRAMINE HCL 25 MG PO CAPS
50.0000 mg | ORAL_CAPSULE | Freq: Once | ORAL | Status: AC
  Administered 2018-08-10: 50 mg via ORAL

## 2018-08-10 MED ORDER — TRASTUZUMAB CHEMO 150 MG IV SOLR
6.0000 mg/kg | Freq: Once | INTRAVENOUS | Status: AC
  Administered 2018-08-10: 399 mg via INTRAVENOUS
  Filled 2018-08-10: qty 19

## 2018-08-10 MED ORDER — ACETAMINOPHEN 325 MG PO TABS
650.0000 mg | ORAL_TABLET | Freq: Once | ORAL | Status: AC
  Administered 2018-08-10: 650 mg via ORAL

## 2018-08-10 MED ORDER — INFLUENZA VAC SPLIT QUAD 0.5 ML IM SUSY
PREFILLED_SYRINGE | INTRAMUSCULAR | Status: AC
  Filled 2018-08-10: qty 0.5

## 2018-08-10 MED ORDER — INFLUENZA VAC SPLIT QUAD 0.5 ML IM SUSY
0.5000 mL | PREFILLED_SYRINGE | Freq: Once | INTRAMUSCULAR | Status: AC
  Administered 2018-08-10: 0.5 mL via INTRAMUSCULAR

## 2018-08-10 MED ORDER — ONDANSETRON 4 MG PO TBDP: 4.0000 mg | ORAL_TABLET | Freq: Four times a day (QID) | ORAL | 3 refills | Status: DC | PRN

## 2018-08-10 MED ORDER — ACETAMINOPHEN 325 MG PO TABS
ORAL_TABLET | ORAL | Status: AC
  Filled 2018-08-10: qty 2

## 2018-08-10 MED ORDER — SODIUM CHLORIDE 0.9 % IV SOLN
Freq: Once | INTRAVENOUS | Status: AC
  Administered 2018-08-10: 12:00:00 via INTRAVENOUS
  Filled 2018-08-10: qty 250

## 2018-08-10 MED FILL — VERZENIO 150 MG TAB: 150 | 28 days supply | Qty: 56 | Fill #0

## 2018-08-10 NOTE — Patient Instructions (Signed)
[facility] Discharge Instructions for Patients Receiving Chemotherapy  Today you received the following chemotherapy agents Herceptin  To help prevent nausea and vomiting after your treatment, we encourage you to take your nausea medication as directed   If you develop nausea and vomiting that is not controlled by your nausea medication, call the clinic.   BELOW ARE SYMPTOMS THAT SHOULD BE REPORTED IMMEDIATELY:  *FEVER GREATER THAN 100.5 F  *CHILLS WITH OR WITHOUT FEVER  NAUSEA AND VOMITING THAT IS NOT CONTROLLED WITH YOUR NAUSEA MEDICATION  *UNUSUAL SHORTNESS OF BREATH  *UNUSUAL BRUISING OR BLEEDING  TENDERNESS IN MOUTH AND THROAT WITH OR WITHOUT PRESENCE OF ULCERS  *URINARY PROBLEMS  *BOWEL PROBLEMS  UNUSUAL RASH Items with * indicate a potential emergency and should be followed up as soon as possible.  Feel free to call the clinic should you have any questions or concerns. The clinic phone number is [phone].  Please show the CHEMO ALERT CARD at check-in to the Emergency Department and triage nurse.   

## 2018-08-10 NOTE — Progress Notes (Signed)
[facility] [place], [place] [address] [phone]   Interval Evaluation  Date of Service: 08/10/18 Patient Name: [name] Riley Patient MRN: [id number] Patient DOB: 07-23-1951 Provider: Ventura Sellers, MD  Identifying Statement:  [name] Riley is a 67 y.o. female with seizures, weakness   Primary Cancer:  Oncologic History:   Breast cancer of upper-outer quadrant of left female breast ([place] [place])   11/14/2011 Surgery    Bilateral mastectomy, prophylactic on the right, left breast IDC 3/18 lymph nodes positive with extracapsular extension ER 89%, PR 81%, HER-2 negative, Ki-67 79% T2 N1 A. stage IIB    12/13/2011 - 06/28/2012 Chemotherapy    4 cycles of FEC followed by 4 cycles of Taxotere    07/17/2012 - 08/22/2012 Radiation Therapy    Adjuvant radiation therapy    08/22/2012 - 03/16/2016 Anti-estrogen oral therapy    Arimidex 1 mg daily    03/16/2016 Relapse/Recurrence    Subcutaneous nodule excision left chest: Infiltrating carcinoma breast primary, ER positive, PR negative    03/29/2016 Imaging    CT CAP and bone scan: Lytic lesions T8 vertebral, T1 posterior element, subcutaneous nodule left lateral chest wall, nonspecific lung nodules; Bone scan: Mets to kull, left humerus, left eighth rib, T7/T8, sternum, left acetabulum    04/28/2016 - 06/17/2017 Chemotherapy    Herceptin, lapatinib, Faslodex, Zometa every 4 weeks, lapatinib discontinued in September 2018 due to elevation of LFTs    06/22/2017 Relapse/Recurrence    Surgical excision:Soft tissue mass left lateral chest wall primary breast cancer, soft tissue mass left medial chest wall breast cancer, tumor is within the dermis extending to the subcutaneous adipose tissue and involves portions of skeletal muscle    08/2017 - 02/16/2018 Chemotherapy    Faslodex with Herceptin and Perjeta along with Zometa every 4 weeks     03/02/2018 - 05/25/2018 Chemotherapy    Kadcyla     05/11/2018 Imaging    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 Associated vasogenic edema within the underlying right cerebral hemisphere without significant midline shift. Signal abnormality throughout the visualized bone marrow, compatible with osseous metastatic disease.       06/04/2018 Imaging    CT CAP: Right lower lobe lung nodule 7 mm (was 5 mm); multiple bone metastases throughout the spine and ribs sternum scapula and humerus, slightly increased lower thoracic mets, right renal lesion 2.5 cm (was 1.2 cm) right femur met increased from 2.1 cm to 2.9 cm    06/15/2018 -  Anti-estrogen oral therapy    Abemaciclib, Herceptin, letrozole, Xgeva     Metastatic breast cancer ([place])   06/29/2017 Initial Diagnosis    Metastatic breast cancer ([place])    06/05/2018 -  Chemotherapy    The patient had trastuzumab (HERCEPTIN) 546 mg in sodium chloride 0.9 % 250 mL chemo infusion, 8 mg/kg = 546 mg, Intravenous,  Once, 2 of 12 cycles Administration: 546 mg (06/15/2018), 399 mg (07/13/2018)  for chemotherapy treatment.      Interval History:  [name] presents today after recent MRI brain.  She describes functional improvement in her left hand, it is now almost back to normal.  She has not experienced any further seizures since starting on Keppra.  Denies headaches.  Medications: Current Outpatient Medications on File Prior to Visit  Medication Sig Dispense Refill  . acetaminophen (TYLENOL) 500 MG tablet Take 1,000 mg by mouth every 6 (six) hours as needed for moderate pain.    Marland Kitchen  aspirin 81 MG tablet Take 81 mg by mouth daily.      . cholecalciferol (VITAMIN D) 1000 units tablet Take 1 tablet (1,000 Units total) by mouth daily.    . diphenhydrAMINE (BENADRYL) 25 MG tablet Take 50 mg by mouth at bedtime as needed for sleep.    Marland Kitchen letrozole (FEMARA) 2.5 MG tablet Take 1 tablet (2.5 mg total) by mouth daily. 90 tablet 3  . levETIRAcetam (KEPPRA) 500 MG tablet Take 1  tablet (500 mg total) by mouth 2 (two) times daily. 60 tablet 3  . levothyroxine (SYNTHROID, LEVOTHROID) 100 MCG tablet Take 100 mcg by mouth daily before breakfast. BRAND ONLY    . lidocaine-prilocaine (EMLA) cream Apply topically as needed (for port access). Apply to affected area once 30 g 3  . metoprolol succinate (TOPROL-XL) 100 MG 24 hr tablet Take 1 tablet (100 mg total) by mouth daily. 90 tablet 3  . Multiple Vitamins-Minerals (MULTIVITAMIN WITH MINERALS) tablet Take 1 tablet by mouth daily.      . ondansetron (ZOFRAN-ODT) 4 MG disintegrating tablet Take 1 tablet (4 mg total) by mouth every 6 (six) hours as needed. (Patient taking differently: Take 4 mg by mouth every 6 (six) hours as needed for nausea or vomiting. ) 20 tablet 3  . VERZENIO 150 MG tablet TAKE 1 TABLET (150 MG TOTAL) BY MOUTH 2 (TWO) TIMES DAILY. SWALLOW TABLETS WHOLE. DO NOT CHEW, CRUSH, OR SPLIT TABLETS BEFORE SWALLOWING. 56 tablet 1   Current Facility-Administered Medications on File Prior to Visit  Medication Dose Route Frequency Provider Last Rate Last Dose  . 0.9 %  sodium chloride infusion   Intravenous Continuous Donnie Mesa, MD      . acetaminophen (TYLENOL) tablet 975 mg  975 mg Oral Q6H Donnie Mesa, MD      . enoxaparin (LOVENOX) injection 40 mg  40 mg Subcutaneous Q24H Donnie Mesa, MD      . morphine 4 MG/ML injection 2-4 mg  2-4 mg Intravenous Q2H PRN Donnie Mesa, MD      . ondansetron (ZOFRAN-ODT) disintegrating tablet 4 mg  4 mg Oral Q6H PRN Donnie Mesa, MD       Or  . ondansetron (ZOFRAN) 4 mg in sodium chloride 0.9 % 50 mL IVPB  4 mg Intravenous Q6H PRN Donnie Mesa, MD      . oxyCODONE (Oxy IR/ROXICODONE) immediate release tablet 5-10 mg  5-10 mg Oral Q4H PRN Donnie Mesa, MD      . piperacillin-tazobactam (ZOSYN) 3.375 g in dextrose 5 % 50 mL IVPB  3.375 g Intravenous Q8H Donnie Mesa, MD      . prochlorperazine (COMPAZINE) tablet 10 mg  10 mg Oral Q6H PRN Donnie Mesa, MD       Or   . prochlorperazine (COMPAZINE) injection 5-10 mg  5-10 mg Intravenous Q6H PRN Donnie Mesa, MD        Allergies:  Allergies  Allergen Reactions  . Cantaloupe Extract Allergy Skin Test Shortness Of Breath  . Contrast Media [Iodinated Diagnostic Agents] Shortness Of Breath and Rash  . Pravastatin Other (See Comments)    Legs hurt  . Zosyn [Piperacillin Sod-Tazobactam So] Rash and Other (See Comments)    Temperature increase, facial flushing   Past Medical History:  Past Medical History:  Diagnosis Date  . Allergy   . Breast cancer ([place]) 08/25/2011   L , invasive ductal carcinoma, ER/PR +,HER2 -  . Cancer [facility])    left breast cancer  . Coronary artery disease  2001  . Heart attack [facility]) 09/2000   Sep 25, 2000  --no intervention  . History of chemotherapy comp. 08/22/2012   4 cycles of FEC and $ cycles of Taxotere  . Hyperlipidemia   . Hypertension   . Hypothyroidism   . PONV (postoperative nausea and vomiting)    gets sick from anesthesia  . Status post radiation therapy 07/09/12 - 08/22/2012   Left Breast, 60.4 gray   Past Surgical History:  Past Surgical History:  Procedure Laterality Date  . ABDOMINAL HYSTERECTOMY  1998   TAH, oophorectomy  . APPENDECTOMY  1970  . BREAST SURGERY  1998   removal of benign lump in rt breast  . BREAST SURGERY  11/14/11   right simple mastectomy, left mrm  . INCISION AND DRAINAGE OF WOUND Left 07/01/2017   Procedure: IRRIGATION AND DEBRIDEMENT CHEST WALL ABSCESS;  Surgeon: Donnie Mesa, MD;  Location: [facility];  Service: General;  Laterality: Left;  Marland Kitchen MASS EXCISION Left 06/22/2017   Procedure: EXCISION OF CHEST WALL MASSES;  Surgeon: Donnie Mesa, MD;  Location: [facility];  Service: General;  Laterality: Left;  Marland Kitchen MASTECTOMY Bilateral    for left breast cancer  . OVARIAN CYST SURGERY Right 1970  . PORT-A-CATH REMOVAL  08/30/2012   Procedure: REMOVAL PORT-A-CATH;  Surgeon: Imogene Burn. [name], MD;  Location: [facility];  Service:  General;  Laterality: Right;  port removal  . PORTACATH PLACEMENT  11/14/2011   Procedure: INSERTION PORT-A-CATH;  Surgeon: Imogene Burn. [name], MD;  Location: [facility];  Service: General;  Laterality: Right;  . PORTACATH PLACEMENT N/A 04/25/2016   Procedure: INSERTION PORT-A-CATH LEFT CHEST;  Surgeon: Donnie Mesa, MD;  Location: [facility];  Service: General;  Laterality: N/A;  . skin tags  05/09/1997   left axillary left neck skin tags  . TONSILLECTOMY  1968   Social History:  Social History   Socioeconomic History  . Marital status: Married    Spouse name: Not on file  . Number of children: 3  . Years of education: Not on file  . Highest education level: Not on file  Occupational History  . Occupation: Works at [employer]  . Financial resource strain: Not on file  . Food insecurity:    Worry: Not on file    Inability: Not on file  . Transportation needs:    Medical: Not on file    Non-medical: Not on file  Tobacco Use  . Smoking status: Never Smoker  . Smokeless tobacco: Never Used  Substance and Sexual Activity  . Alcohol use: No  . Drug use: No  . Sexual activity: Yes    Birth control/protection: Surgical    Comment: menarche 21, Parity age 7, G71, P3, 1 miscarriage,  HRT x 5-10 yrs, Mild Hot Flashes  Lifestyle  . Physical activity:    Days per week: Not on file    Minutes per session: Not on file  . Stress: Not on file  Relationships  . Social connections:    Talks on phone: Not on file    Gets together: Not on file    Attends religious service: Not on file    Active member of club or organization: Not on file    Attends meetings of clubs or organizations: Not on file    Relationship status: Not on file  . Intimate partner violence:    Fear of current or ex partner: Not on file    Emotionally  abused: Not on file    Physically abused: Not on file    Forced sexual activity: Not on file  Other Topics Concern  . Not on file    Social History Narrative   Lives at home.   Family History:  Family History  Problem Relation Age of Onset  . Hypertension Maternal Grandmother   . Diabetes Maternal Grandmother   . Cancer Father 67       lung cancer and Prostate Cancer  . Hypertension Mother   . Cancer Paternal Aunt        ovarian  . Cancer Cousin        breast, paternal cousin  . Cancer Paternal Uncle        stomach  . Cancer Paternal Grandfather        Esophagus  . Colon cancer Neg Hx     Review of Systems: Constitutional: Denies fevers, chills or abnormal weight loss Eyes: Denies blurriness of vision Ears, nose, mouth, throat, and face: Denies mucositis or sore throat Respiratory: Denies cough, dyspnea or wheezes Cardiovascular: Denies palpitation, chest discomfort or lower extremity swelling Gastrointestinal:  Denies nausea, constipation, diarrhea GU: Denies dysuria or incontinence Skin: Denies abnormal skin rashes Neurological: Per HPI Musculoskeletal: Denies joint pain, back or neck discomfort. No decrease in ROM Behavioral/Psych: Denies anxiety, disturbance in thought content, and mood instability   Physical Exam: Vitals:   08/10/18 0933  BP: (!) 172/108  Pulse: 87  Resp: 18  Temp: 98.5 F (36.9 C)  SpO2: 100%   KPS: 90. General: Alert, cooperative, pleasant, in no acute distress Head: Normal EENT: No conjunctival injection or scleral icterus. Oral mucosa moist Lungs: Resp effort normal Cardiac: Regular rate and rhythm Abdomen: Soft, non-distended abdomen Skin: No rashes cyanosis or petechiae. Extremities: No clubbing or edema  Neurologic Exam: Mental Status: Awake, alert, attentive to examiner. Oriented to self and environment. Language is fluent with intact comprehension.  Cranial Nerves: Visual acuity is grossly normal. Visual fields are full. Extra-ocular movements intact. No ptosis. Face is symmetric, tongue midline. Motor: Tone and bulk are normal. Slight incoordination with  fine motor function in left hand. Reflexes are symmetric, no pathologic reflexes present. Intact finger to nose bilaterally Sensory: Normal Gait: Normal and tandem gait is normal.   Labs: I have reviewed the data as listed    Component Value Date/Time   NA 141 08/10/2018 0847   NA 140 09/22/2017 0958   K 3.4 (L) 08/10/2018 0847   K 3.7 09/22/2017 0958   CL 104 08/10/2018 0847   CL 105 08/10/2012 0908   CO2 28 08/10/2018 0847   CO2 26 09/22/2017 0958   GLUCOSE 101 (H) 08/10/2018 0847   GLUCOSE 80 09/22/2017 0958   GLUCOSE 81 08/10/2012 0908   BUN 10 08/10/2018 0847   BUN 12.8 09/22/2017 0958   CREATININE 0.78 08/10/2018 0847   CREATININE 0.6 09/22/2017 0958   CALCIUM 9.7 08/10/2018 0847   CALCIUM 9.1 09/22/2017 0958   PROT 7.1 08/10/2018 0847   PROT 7.0 09/22/2017 0958   ALBUMIN 3.5 08/10/2018 0847   ALBUMIN 4.0 09/22/2017 0958   AST 40 08/10/2018 0847   AST 37 (H) 09/22/2017 0958   ALT 25 08/10/2018 0847   ALT 31 09/22/2017 0958   ALKPHOS 132 (H) 08/10/2018 0847   ALKPHOS 140 09/22/2017 0958   BILITOT 0.4 08/10/2018 0847   BILITOT 0.41 09/22/2017 0958   GFRNONAA >60 08/10/2018 0847   GFRAA >60 08/10/2018 0847   Lab Results  Component Value Date   WBC 4.2 08/10/2018   NEUTROABS 1.5 (L) 08/10/2018   HGB 9.9 (L) 08/10/2018   HCT 30.0 (L) 08/10/2018   MCV 95.5 08/10/2018   PLT 149 (L) 08/10/2018    Imaging:  Imaging:  [facility] Clinician Interpretation: I have personally reviewed the CNS images as listed.  My interpretation, in the context of the patient's clinical presentation, is stable disease  Mr Jeri Cos Wo Contrast  Result Date: 08/06/2018 CLINICAL DATA:  On treatment for metastatic breast cancer. 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 EXAM: MRI HEAD WITHOUT AND WITH CONTRAST TECHNIQUE: Multiplanar, multiecho pulse sequences of the brain and surrounding structures were obtained without and with intravenous contrast. CONTRAST:  6.5 cc Gadavist COMPARISON:  MRI  head May 11, 2018 FINDINGS: Mild motion degraded examination. INTRACRANIAL CONTENTS: RIGHT frontal parietal convexity dural metastasis was 12 x 51 mm (cc by AP), now 8 x 46 mm. No new extra-axial enhancement. Decreased RIGHT frontoparietal vasogenic edema. Similar faint reduced diffusion RIGHT frontal parietal lobes with T2 shine through. No reduced diffusion to suggest acute infarct. No susceptibility artifact to suggest hemorrhage. No parenchymal brain volume loss for age. No hydrocephalus. No intraparenchymal masses. No midline shift. No abnormal extra-axial fluid collections. VASCULAR: Normal major intracranial vascular flow voids present at skull base. SKULL AND UPPER CERVICAL SPINE: No abnormal sellar expansion. Heterogeneous expansile calvarial bone marrow signal consistent with osseous metastasis. Craniocervical junction maintained. SINUSES/ORBITS: Mild paranasal sinus mucosal thickening. Mastoid air cells are well aerated.The included ocular globes and orbital contents are non-suspicious. OTHER: None. IMPRESSION: 1. Treatment response: Decreased RIGHT frontoparietal dural metastasis, decreased associated parenchymal edema. 2. Redemonstration of multiple calvarial metastasis. Electronically Signed   By: Elon Alas M.D.   On: 08/06/2018 15:44      Assessment/Plan 1. 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 Borgmeyer is clinically and radiographically stable today.  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  We recommend she repeat MRI brain in 3 months and return to clinic for evaluation at that time.  We appreciate the opportunity to participate in the care of Marnie Fazzino Zimmerle.    All questions were answered. The patient knows to call the clinic with any problems, questions or concerns. No barriers to learning were detected.  The total time spent in the encounter was 25 minutes and more than 50% was on counseling  and review of test results   Ventura Sellers, MD Medical Director of Neuro-Oncology [facility] [place] 08/10/18 9:47 AM

## 2018-08-10 NOTE — Assessment & Plan Note (Addendum)
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 Was on Arimidex since 08/22/2012 to 03/30/16 Recent treatment: Kadcyla Subcutaneous nodule excisionleft chest: Infiltrating carcinoma breast primary, ER positive, PR negative, HER-2 positive  06/04/2018:CTCAP andBone scan: Progression of metastatic disease involving the bone, renal lesion and lung nodule  Current treatment:  Abemaciclibwith letrozole along with Herceptin(to be given every 4 weeks)and Xgeva  Brain MRI showing dural based metastases: Because the patient is asymptomatic we decided to not perform any procedures at this time.  Abemaciclib toxicities 1.  Diarrhea alternating with constipation 2.emergency room visit because of tremors and panic attack: No further issues 3.Thrombocytopenia: Platelet count is increased to 111 4.  Anemia due to therapy: Hemoglobin is 10.3 and we are watching and monitoring it.  Return to clinic in 1 month for follow-up

## 2018-08-10 NOTE — Progress Notes (Signed)
Patient Care Team: Bridget Hartshorn, NP as PCP - General (Adult Health Nurse Practitioner)  DIAGNOSIS:  Encounter Diagnosis  Name Primary?  . Metastatic breast cancer ([place])     SUMMARY OF ONCOLOGIC HISTORY:   Breast cancer of upper-outer quadrant of left female breast ([place])   11/14/2011 Surgery    Bilateral mastectomy, prophylactic on the right, left breast IDC 3/18 lymph nodes positive with extracapsular extension ER 89%, PR 81%, HER-2 negative, Ki-67 79% T2 N1 A. stage IIB    12/13/2011 - 06/28/2012 Chemotherapy    4 cycles of FEC followed by 4 cycles of Taxotere    07/17/2012 - 08/22/2012 Radiation Therapy    Adjuvant radiation therapy    08/22/2012 - 03/16/2016 Anti-estrogen oral therapy    Arimidex 1 mg daily    03/16/2016 Relapse/Recurrence    Subcutaneous nodule excision left chest: Infiltrating carcinoma breast primary, ER positive, PR negative    03/29/2016 Imaging    CT CAP and bone scan: Lytic lesions T8 vertebral, T1 posterior element, subcutaneous nodule left lateral chest wall, nonspecific lung nodules; Bone scan: Mets to kull, left humerus, left eighth rib, T7/T8, sternum, left acetabulum    04/28/2016 - 06/17/2017 Chemotherapy    Herceptin, lapatinib, Faslodex, Zometa every 4 weeks, lapatinib discontinued in September 2018 due to elevation of LFTs    06/22/2017 Relapse/Recurrence    Surgical excision:Soft tissue mass left lateral chest wall primary breast cancer, soft tissue mass left medial chest wall breast cancer, tumor is within the dermis extending to the subcutaneous adipose tissue and involves portions of skeletal muscle    08/2017 - 02/16/2018 Chemotherapy    Faslodex with Herceptin and Perjeta along with Zometa every 4 weeks     03/02/2018 - 05/25/2018 Chemotherapy    Kadcyla     05/11/2018 Imaging    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 Associated vasogenic edema within the underlying right cerebral hemisphere without  significant midline shift. Signal abnormality throughout the visualized bone marrow, compatible with osseous metastatic disease.       06/04/2018 Imaging    CT CAP: Right lower lobe lung nodule 7 mm (was 5 mm); multiple bone metastases throughout the spine and ribs sternum scapula and humerus, slightly increased lower thoracic mets, right renal lesion 2.5 cm (was 1.2 cm) right femur met increased from 2.1 cm to 2.9 cm    06/15/2018 -  Anti-estrogen oral therapy    Abemaciclib, Herceptin, letrozole, Xgeva     Metastatic breast cancer ([place])   06/29/2017 Initial Diagnosis    Metastatic breast cancer ([place])    06/05/2018 -  Chemotherapy    The patient had trastuzumab (HERCEPTIN) 546 mg in sodium chloride 0.9 % 250 mL chemo infusion, 8 mg/kg = 546 mg, Intravenous,  Once, 2 of 12 cycles Administration: 546 mg (06/15/2018), 399 mg (07/13/2018)  for chemotherapy treatment.      CHIEF COMPLIANT: Follow-up on Herceptin, abemaciclib, letrozole  INTERVAL HISTORY: [name] Riley is a 67 year old lady with above-mentioned his metastatic breast cancer is currently on Abemaciclib with Herceptin and letrozole.  She is tolerating the treatment extremely well.  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  She is not having any significant diarrhea.  Her appetite is also decreased.  Denies any fevers or chills.  She had an MRI of the brain which showed improvement in the dural based and changes.  REVIEW OF SYSTEMS:   Constitutional: Denies fevers, chills or abnormal weight loss Eyes:  Denies blurriness of vision Ears, nose, mouth, throat, and face: Denies mucositis or sore throat Respiratory: Denies cough, dyspnea or wheezes Cardiovascular: Denies palpitation, chest discomfort Gastrointestinal:  Denies nausea, heartburn or change in bowel habits Skin: Denies abnormal skin rashes Lymphatics: Denies new lymphadenopathy or easy bruising Neurological:Denies numbness, tingling or new  weaknesses Behavioral/Psych: Mood is stable, no new changes  Extremities: No lower extremity edema   All other systems were reviewed with the patient and are negative.  I have reviewed the past medical history, past surgical history, social history and family history with the patient and they are unchanged from previous note.  ALLERGIES:  is allergic to cantaloupe extract allergy skin test; contrast media [iodinated diagnostic agents]; pravastatin; and zosyn [piperacillin sod-tazobactam so].  MEDICATIONS:  Current Outpatient Medications  Medication Sig Dispense Refill  . acetaminophen (TYLENOL) 500 MG tablet Take 1,000 mg by mouth every 6 (six) hours as needed for moderate pain.    Marland Kitchen aspirin 81 MG tablet Take 81 mg by mouth daily.      . cholecalciferol (VITAMIN D) 1000 units tablet Take 1 tablet (1,000 Units total) by mouth daily.    . diphenhydrAMINE (BENADRYL) 25 MG tablet Take 50 mg by mouth at bedtime as needed for sleep.    Marland Kitchen letrozole (FEMARA) 2.5 MG tablet Take 1 tablet (2.5 mg total) by mouth daily. 90 tablet 3  . levETIRAcetam (KEPPRA) 500 MG tablet Take 1 tablet (500 mg total) by mouth 2 (two) times daily. 60 tablet 3  . levothyroxine (SYNTHROID, LEVOTHROID) 100 MCG tablet Take 100 mcg by mouth daily before breakfast. BRAND ONLY    . lidocaine-prilocaine (EMLA) cream Apply topically as needed (for port access). Apply to affected area once 30 g 3  . metoprolol succinate (TOPROL-XL) 100 MG 24 hr tablet Take 1 tablet (100 mg total) by mouth daily. 90 tablet 3  . Multiple Vitamins-Minerals (MULTIVITAMIN WITH MINERALS) tablet Take 1 tablet by mouth daily.      . ondansetron (ZOFRAN-ODT) 4 MG disintegrating tablet Take 1 tablet (4 mg total) by mouth every 6 (six) hours as needed. (Patient taking differently: Take 4 mg by mouth every 6 (six) hours as needed for nausea or vomiting. ) 20 tablet 3  . VERZENIO 150 MG tablet TAKE 1 TABLET (150 MG TOTAL) BY MOUTH 2 (TWO) TIMES DAILY. SWALLOW  TABLETS WHOLE. DO NOT CHEW, CRUSH, OR SPLIT TABLETS BEFORE SWALLOWING. 56 tablet 1   No current facility-administered medications for this visit.    Facility-Administered Medications Ordered in Other Visits  Medication Dose Route Frequency Provider Last Rate Last Dose  . 0.9 %  sodium chloride infusion   Intravenous Continuous Donnie Mesa, MD      . acetaminophen (TYLENOL) tablet 975 mg  975 mg Oral Q6H Donnie Mesa, MD      . enoxaparin (LOVENOX) injection 40 mg  40 mg Subcutaneous Q24H Donnie Mesa, MD      . morphine 4 MG/ML injection 2-4 mg  2-4 mg Intravenous Q2H PRN Donnie Mesa, MD      . ondansetron (ZOFRAN-ODT) disintegrating tablet 4 mg  4 mg Oral Q6H PRN Donnie Mesa, MD       Or  . ondansetron (ZOFRAN) 4 mg in sodium chloride 0.9 % 50 mL IVPB  4 mg Intravenous Q6H PRN Donnie Mesa, MD      . oxyCODONE (Oxy IR/ROXICODONE) immediate release tablet 5-10 mg  5-10 mg Oral Q4H PRN Donnie Mesa, MD      . piperacillin-tazobactam (  ZOSYN) 3.375 g in dextrose 5 % 50 mL IVPB  3.375 g Intravenous Q8H Donnie Mesa, MD      . prochlorperazine (COMPAZINE) tablet 10 mg  10 mg Oral Q6H PRN Donnie Mesa, MD       Or  . prochlorperazine (COMPAZINE) injection 5-10 mg  5-10 mg Intravenous Q6H PRN Donnie Mesa, MD        PHYSICAL EXAMINATION: ECOG PERFORMANCE STATUS: 1 - Symptomatic but completely ambulatory  Vitals:   08/10/18 1022  BP: (!) 158/92  Pulse: 87  Resp: 18  Temp: 98.5 F (36.9 C)  SpO2: 100%   Filed Weights   08/10/18 1022  Weight: 141 lb 8 oz (64.2 kg)    GENERAL:alert, no distress and comfortable SKIN: skin color, texture, turgor are normal, no rashes or significant lesions EYES: normal, Conjunctiva are pink and non-injected, sclera clear OROPHARYNX:no exudate, no erythema and lips, buccal mucosa, and tongue normal  NECK: supple, thyroid normal size, non-tender, without nodularity LYMPH:  no palpable lymphadenopathy in the cervical, axillary or  inguinal LUNGS: clear to auscultation and percussion with normal breathing effort HEART: regular rate & rhythm and no murmurs and no lower extremity edema ABDOMEN:abdomen soft, non-tender and normal bowel sounds MUSCULOSKELETAL:no cyanosis of digits and no clubbing  NEURO: alert & oriented x 3 with fluent speech, no focal motor/sensory deficits EXTREMITIES: No lower extremity edema    LABORATORY DATA:  I have reviewed the data as listed CMP Latest Ref Rng & Units 08/10/2018 08/06/2018 07/13/2018  Glucose 70 - 99 mg/dL 101(H) - 115(H)  BUN 8 - 23 mg/dL 10 - 11  Creatinine 0.44 - 1.00 mg/dL 0.78 0.74 0.80  Sodium 135 - 145 mmol/L 141 - 140  Potassium 3.5 - 5.1 mmol/L 3.4(L) - 3.3(L)  Chloride 98 - 111 mmol/L 104 - 103  CO2 22 - 32 mmol/L 28 - 28  Calcium 8.9 - 10.3 mg/dL 9.7 - 8.8(L)  Total Protein 6.5 - 8.1 g/dL 7.1 - 7.0  Total Bilirubin 0.3 - 1.2 mg/dL 0.4 - 0.4  Alkaline Phos 38 - 126 U/L 132(H) - 143(H)  AST 15 - 41 U/L 40 - 27  ALT 0 - 44 U/L 25 - 19    Lab Results  Component Value Date   WBC 4.2 08/10/2018   HGB 9.9 (L) 08/10/2018   HCT 30.0 (L) 08/10/2018   MCV 95.5 08/10/2018   PLT 149 (L) 08/10/2018   NEUTROABS 1.5 (L) 08/10/2018    ASSESSMENT & PLAN:  Metastatic breast cancer (HCC) Left breast invasive ductal carcinomaT2, N1, 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 Was on Arimidex since 08/22/2012 to 03/30/16 Recent treatment: Kadcyla Subcutaneous nodule excisionleft chest: Infiltrating carcinoma breast primary, ER positive, PR negative, HER-2 positive  06/04/2018:CTCAP andBone scan: Progression of metastatic disease involving the bone, renal lesion and lung nodule  Current treatment:  Abemaciclibwith letrozole along with Herceptin(to be given every 4 weeks)and Xgeva  Brain MRI showing dural based  metastases: Because the patient is asymptomatic we decided to not perform any procedures at this time.  Repeat brain MRI done at this week showed marked improvement in the dural metastases.  Abemaciclib toxicities 1.  Diarrhea alternating with constipation: Markedly improved 2.emergency room visit because of tremors and panic attack: No further issues 3.Thrombocytopenia: Platelet count is increased to 149 4.  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  Return to clinic in 1 month for follow-up and we will perform CT chest abdomen pelvis and bone scan prior to that.    No orders of the defined types were placed in this encounter.  The patient has a good understanding of the overall plan. she agrees with it. she will call with any problems that may develop before the next visit here.   Harriette Ohara, MD 08/10/18

## 2018-08-15 ENCOUNTER — Inpatient Hospital Stay: Payer: Medicare Other

## 2018-08-15 ENCOUNTER — Telehealth: Payer: Self-pay | Admitting: *Deleted

## 2018-08-15 ENCOUNTER — Other Ambulatory Visit: Payer: Self-pay

## 2018-08-15 DIAGNOSIS — Z5181 Encounter for therapeutic drug level monitoring: Secondary | ICD-10-CM

## 2018-08-15 DIAGNOSIS — Z79899 Other long term (current) drug therapy: Principal | ICD-10-CM

## 2018-08-15 DIAGNOSIS — C50412 Malignant neoplasm of upper-outer quadrant of left female breast: Secondary | ICD-10-CM

## 2018-08-15 MED ORDER — PREDNISONE 50 MG PO TABS: ORAL_TABLET | ORAL | 0 refills | Status: DC

## 2018-08-15 MED ORDER — PREDNISONE 50 MG PO TABS: 50.0000 mg | ORAL_TABLET | Freq: Every day | ORAL | Status: DC

## 2018-08-15 NOTE — Progress Notes (Signed)
Pt called to get her prednisone dose clarified. Pt has contrast dye allergy and will need 50mg  prednisone 13hrs, 7hrs, and 1hr prior to CT Scan, along with 50mg  benadryl 1 hr prior to CT. Escript sent for prednisone dose. Pt aware and thankful for the call. Pt would also like to get her echocardiogram set up in [place]. She would like to call them to make an appt soon. Told pt that an order was placed and she may go ahead and call to schedule, when available.

## 2018-08-15 NOTE — Telephone Encounter (Signed)
Received TC from patient this am with question regarding her upcoming  Scans.  She states that she is allergic to the dye used in the scans and takers prednisone prior to exam. She states it is usually 50 mg 12 or 6 hours prior to exam.  She did receive prescription for prednisone but the prescription directions say 20 mg 1 every morning x12 doses.  She is seeking clarification of prednisone order. Pt given # for radiology central scheduling so she can get appt date/time for scans.  She also states she will call to schedule her echocardiogram in [place] as that is due as well.  Please call patient to clarify prednisone dosage she needs to take prior to scans

## 2018-08-15 NOTE — Telephone Encounter (Signed)
Will clarify and call pt.

## 2018-08-17 ENCOUNTER — Other Ambulatory Visit: Payer: Medicare Other

## 2018-08-17 ENCOUNTER — Ambulatory Visit: Payer: Medicare Other | Admitting: Adult Health

## 2018-08-17 ENCOUNTER — Ambulatory Visit: Payer: Medicare Other

## 2018-08-29 ENCOUNTER — Other Ambulatory Visit: Payer: Self-pay

## 2018-08-29 DIAGNOSIS — C7931 Secondary malignant neoplasm of brain: Secondary | ICD-10-CM

## 2018-08-30 ENCOUNTER — Telehealth: Payer: Self-pay | Admitting: *Deleted

## 2018-08-30 ENCOUNTER — Other Ambulatory Visit: Payer: Self-pay

## 2018-08-30 ENCOUNTER — Ambulatory Visit (INDEPENDENT_AMBULATORY_CARE_PROVIDER_SITE_OTHER): Payer: Medicare Other

## 2018-08-30 DIAGNOSIS — Z5181 Encounter for therapeutic drug level monitoring: Secondary | ICD-10-CM

## 2018-08-30 DIAGNOSIS — Z79899 Other long term (current) drug therapy: Secondary | ICD-10-CM | POA: Diagnosis not present

## 2018-08-30 NOTE — Telephone Encounter (Signed)
Scheduled patient for MRI Brain.  Attempted to notify patient over phone, poor connection.  Mailing updated calender with notification.

## 2018-09-06 ENCOUNTER — Encounter (HOSPITAL_COMMUNITY)
Admission: RE | Admit: 2018-09-06 | Discharge: 2018-09-06 | Disposition: A | Discharge status: Home / Self Care | Payer: Medicare Other | Source: Ambulatory Visit | Attending: Hematology and Oncology | Admitting: Hematology and Oncology

## 2018-09-06 ENCOUNTER — Ambulatory Visit (HOSPITAL_COMMUNITY)
Admission: RE | Admit: 2018-09-06 | Discharge: 2018-09-06 | Disposition: A | Discharge status: Home / Self Care | Payer: Medicare Other | Source: Ambulatory Visit | Attending: Hematology and Oncology | Admitting: Hematology and Oncology

## 2018-09-06 ENCOUNTER — Other Ambulatory Visit: Payer: Self-pay

## 2018-09-06 ENCOUNTER — Inpatient Hospital Stay: Payer: Medicare Other | Attending: Hematology and Oncology

## 2018-09-06 ENCOUNTER — Inpatient Hospital Stay: Payer: Medicare Other

## 2018-09-06 DIAGNOSIS — Z7982 Long term (current) use of aspirin: Secondary | ICD-10-CM | POA: Insufficient documentation

## 2018-09-06 DIAGNOSIS — Z170 Estrogen receptor positive status [ER+]: Secondary | ICD-10-CM | POA: Insufficient documentation

## 2018-09-06 DIAGNOSIS — C50412 Malignant neoplasm of upper-outer quadrant of left female breast: Secondary | ICD-10-CM

## 2018-09-06 DIAGNOSIS — R21 Rash and other nonspecific skin eruption: Secondary | ICD-10-CM | POA: Insufficient documentation

## 2018-09-06 DIAGNOSIS — C50919 Malignant neoplasm of unspecified site of unspecified female breast: Secondary | ICD-10-CM | POA: Insufficient documentation

## 2018-09-06 DIAGNOSIS — R5383 Other fatigue: Secondary | ICD-10-CM | POA: Insufficient documentation

## 2018-09-06 DIAGNOSIS — Z79899 Other long term (current) drug therapy: Secondary | ICD-10-CM | POA: Diagnosis not present

## 2018-09-06 DIAGNOSIS — D696 Thrombocytopenia, unspecified: Secondary | ICD-10-CM | POA: Insufficient documentation

## 2018-09-06 DIAGNOSIS — D509 Iron deficiency anemia, unspecified: Secondary | ICD-10-CM | POA: Diagnosis not present

## 2018-09-06 DIAGNOSIS — K8020 Calculus of gallbladder without cholecystitis without obstruction: Secondary | ICD-10-CM | POA: Insufficient documentation

## 2018-09-06 DIAGNOSIS — Z9221 Personal history of antineoplastic chemotherapy: Secondary | ICD-10-CM | POA: Insufficient documentation

## 2018-09-06 DIAGNOSIS — C7951 Secondary malignant neoplasm of bone: Secondary | ICD-10-CM

## 2018-09-06 DIAGNOSIS — N2889 Other specified disorders of kidney and ureter: Secondary | ICD-10-CM | POA: Insufficient documentation

## 2018-09-06 DIAGNOSIS — R609 Edema, unspecified: Secondary | ICD-10-CM | POA: Insufficient documentation

## 2018-09-06 DIAGNOSIS — Z9013 Acquired absence of bilateral breasts and nipples: Secondary | ICD-10-CM | POA: Insufficient documentation

## 2018-09-06 DIAGNOSIS — R918 Other nonspecific abnormal finding of lung field: Secondary | ICD-10-CM | POA: Insufficient documentation

## 2018-09-06 DIAGNOSIS — C7931 Secondary malignant neoplasm of brain: Secondary | ICD-10-CM | POA: Insufficient documentation

## 2018-09-06 DIAGNOSIS — Z923 Personal history of irradiation: Secondary | ICD-10-CM | POA: Diagnosis not present

## 2018-09-06 DIAGNOSIS — Z5112 Encounter for antineoplastic immunotherapy: Secondary | ICD-10-CM | POA: Insufficient documentation

## 2018-09-06 DIAGNOSIS — Z79811 Long term (current) use of aromatase inhibitors: Secondary | ICD-10-CM | POA: Diagnosis not present

## 2018-09-06 LAB — CBC WITH DIFFERENTIAL (CANCER CENTER ONLY)
Abs Immature Granulocytes: 0.02 10*3/uL (ref 0.00–0.07)
Basophils Absolute: 0 10*3/uL (ref 0.0–0.1)
Basophils Relative: 0 %
Eosinophils Absolute: 0 10*3/uL (ref 0.0–0.5)
Eosinophils Relative: 0 %
HCT: 31.1 % — ABNORMAL LOW (ref 36.0–46.0)
Hemoglobin: 10.4 g/dL — ABNORMAL LOW (ref 12.0–15.0)
Immature Granulocytes: 1 %
Lymphocytes Relative: 31 %
Lymphs Abs: 0.8 10*3/uL (ref 0.7–4.0)
MCH: 33.2 pg (ref 26.0–34.0)
MCHC: 33.4 g/dL (ref 30.0–36.0)
MCV: 99.4 fL (ref 80.0–100.0)
Monocytes Absolute: 0 10*3/uL — ABNORMAL LOW (ref 0.1–1.0)
Monocytes Relative: 2 %
Neutro Abs: 1.8 10*3/uL (ref 1.7–7.7)
Neutrophils Relative %: 66 %
Platelet Count: 141 10*3/uL — ABNORMAL LOW (ref 150–400)
RBC: 3.13 MIL/uL — ABNORMAL LOW (ref 3.87–5.11)
RDW: 16.4 % — ABNORMAL HIGH (ref 11.5–15.5)
WBC Count: 2.7 10*3/uL — ABNORMAL LOW (ref 4.0–10.5)
nRBC: 0 % (ref 0.0–0.2)

## 2018-09-06 LAB — CMP (CANCER CENTER ONLY)
ALT: 23 U/L (ref 0–44)
AST: 34 U/L (ref 15–41)
Albumin: 4.1 g/dL (ref 3.5–5.0)
Alkaline Phosphatase: 139 U/L — ABNORMAL HIGH (ref 38–126)
Anion gap: 10 (ref 5–15)
BUN: 15 mg/dL (ref 8–23)
CO2: 26 mmol/L (ref 22–32)
Calcium: 10.2 mg/dL (ref 8.9–10.3)
Chloride: 103 mmol/L (ref 98–111)
Creatinine: 0.96 mg/dL (ref 0.44–1.00)
GFR, Est AFR Am: >60 mL/min (ref >60)
GFR, Estimated: >60 mL/min (ref >60)
Glucose, Bld: 143 mg/dL — ABNORMAL HIGH (ref 70–99)
Potassium: 3.9 mmol/L (ref 3.5–5.1)
Sodium: 139 mmol/L (ref 135–145)
Total Bilirubin: 0.6 mg/dL (ref 0.3–1.2)
Total Protein: 7.7 g/dL (ref 6.5–8.1)

## 2018-09-06 MED ORDER — HEPARIN SOD (PORK) LOCK FLUSH 100 UNIT/ML IV SOLN
500.0000 [IU] | Freq: Once | INTRAVENOUS | Status: AC
  Administered 2018-09-06: 500 [IU] via INTRAVENOUS

## 2018-09-06 MED ORDER — HEPARIN SOD (PORK) LOCK FLUSH 100 UNIT/ML IV SOLN
INTRAVENOUS | Status: AC
  Filled 2018-09-06: qty 5

## 2018-09-06 MED ORDER — TECHNETIUM TC 99M MEDRONATE IV KIT
19.9000 | PACK | Freq: Once | INTRAVENOUS | Status: AC
  Administered 2018-09-06: 19.9 via INTRAVENOUS

## 2018-09-06 MED ORDER — SODIUM CHLORIDE (PF) 0.9 % IJ SOLN
INTRAMUSCULAR | Status: AC
  Filled 2018-09-06: qty 50

## 2018-09-06 MED ORDER — IOHEXOL 300 MG/ML  SOLN
100.0000 mL | Freq: Once | INTRAMUSCULAR | Status: AC | PRN
  Administered 2018-09-06: 100 mL via INTRAVENOUS

## 2018-09-06 MED FILL — VERZENIO 150 MG TAB: 150 | 28 days supply | Qty: 56 | Fill #1

## 2018-09-07 ENCOUNTER — Inpatient Hospital Stay: Payer: Medicare Other

## 2018-09-07 ENCOUNTER — Telehealth: Payer: Self-pay | Admitting: Hematology and Oncology

## 2018-09-07 ENCOUNTER — Inpatient Hospital Stay (HOSPITAL_BASED_OUTPATIENT_CLINIC_OR_DEPARTMENT_OTHER): Payer: Medicare Other | Admitting: Hematology and Oncology

## 2018-09-07 ENCOUNTER — Other Ambulatory Visit: Payer: Medicare Other

## 2018-09-07 DIAGNOSIS — Z9013 Acquired absence of bilateral breasts and nipples: Secondary | ICD-10-CM

## 2018-09-07 DIAGNOSIS — C7951 Secondary malignant neoplasm of bone: Secondary | ICD-10-CM

## 2018-09-07 DIAGNOSIS — N2889 Other specified disorders of kidney and ureter: Secondary | ICD-10-CM

## 2018-09-07 DIAGNOSIS — Z5112 Encounter for antineoplastic immunotherapy: Secondary | ICD-10-CM

## 2018-09-07 DIAGNOSIS — D509 Iron deficiency anemia, unspecified: Secondary | ICD-10-CM

## 2018-09-07 DIAGNOSIS — D696 Thrombocytopenia, unspecified: Secondary | ICD-10-CM

## 2018-09-07 DIAGNOSIS — Z923 Personal history of irradiation: Secondary | ICD-10-CM

## 2018-09-07 DIAGNOSIS — C50919 Malignant neoplasm of unspecified site of unspecified female breast: Secondary | ICD-10-CM

## 2018-09-07 DIAGNOSIS — R918 Other nonspecific abnormal finding of lung field: Secondary | ICD-10-CM

## 2018-09-07 DIAGNOSIS — Z170 Estrogen receptor positive status [ER+]: Secondary | ICD-10-CM

## 2018-09-07 DIAGNOSIS — C50412 Malignant neoplasm of upper-outer quadrant of left female breast: Secondary | ICD-10-CM

## 2018-09-07 DIAGNOSIS — Z79811 Long term (current) use of aromatase inhibitors: Secondary | ICD-10-CM

## 2018-09-07 DIAGNOSIS — C7931 Secondary malignant neoplasm of brain: Secondary | ICD-10-CM

## 2018-09-07 DIAGNOSIS — R5383 Other fatigue: Secondary | ICD-10-CM

## 2018-09-07 DIAGNOSIS — R609 Edema, unspecified: Secondary | ICD-10-CM

## 2018-09-07 DIAGNOSIS — R21 Rash and other nonspecific skin eruption: Secondary | ICD-10-CM

## 2018-09-07 DIAGNOSIS — Z9221 Personal history of antineoplastic chemotherapy: Secondary | ICD-10-CM

## 2018-09-07 DIAGNOSIS — Z95828 Presence of other vascular implants and grafts: Secondary | ICD-10-CM

## 2018-09-07 DIAGNOSIS — Z7982 Long term (current) use of aspirin: Secondary | ICD-10-CM

## 2018-09-07 LAB — CBC WITH DIFFERENTIAL (CANCER CENTER ONLY)
Abs Immature Granulocytes: 0.03 10*3/uL (ref 0.00–0.07)
Basophils Absolute: 0 10*3/uL (ref 0.0–0.1)
Basophils Relative: 0 %
Eosinophils Absolute: 0 10*3/uL (ref 0.0–0.5)
Eosinophils Relative: 0 %
HCT: 27.8 % — ABNORMAL LOW (ref 36.0–46.0)
Hemoglobin: 9.4 g/dL — ABNORMAL LOW (ref 12.0–15.0)
Immature Granulocytes: 0 %
Lymphocytes Relative: 38 %
Lymphs Abs: 3 10*3/uL (ref 0.7–4.0)
MCH: 33.7 pg (ref 26.0–34.0)
MCHC: 33.8 g/dL (ref 30.0–36.0)
MCV: 99.6 fL (ref 80.0–100.0)
Monocytes Absolute: 0.4 10*3/uL (ref 0.1–1.0)
Monocytes Relative: 6 %
Neutro Abs: 4.4 10*3/uL (ref 1.7–7.7)
Neutrophils Relative %: 56 %
Platelet Count: 123 10*3/uL — ABNORMAL LOW (ref 150–400)
RBC: 2.79 MIL/uL — ABNORMAL LOW (ref 3.87–5.11)
RDW: 16.7 % — ABNORMAL HIGH (ref 11.5–15.5)
WBC Count: 7.9 10*3/uL (ref 4.0–10.5)
nRBC: 0 % (ref 0.0–0.2)

## 2018-09-07 LAB — CMP (CANCER CENTER ONLY)
ALT: 24 U/L (ref 0–44)
AST: 35 U/L (ref 15–41)
Albumin: 3.9 g/dL (ref 3.5–5.0)
Alkaline Phosphatase: 117 U/L (ref 38–126)
Anion gap: 9 (ref 5–15)
BUN: 17 mg/dL (ref 8–23)
CO2: 27 mmol/L (ref 22–32)
Calcium: 10.1 mg/dL (ref 8.9–10.3)
Chloride: 102 mmol/L (ref 98–111)
Creatinine: 0.89 mg/dL (ref 0.44–1.00)
GFR, Est AFR Am: >60 mL/min (ref >60)
GFR, Estimated: >60 mL/min (ref >60)
Glucose, Bld: 97 mg/dL (ref 70–99)
Potassium: 3.2 mmol/L — ABNORMAL LOW (ref 3.5–5.1)
Sodium: 138 mmol/L (ref 135–145)
Total Bilirubin: 0.5 mg/dL (ref 0.3–1.2)
Total Protein: 7.2 g/dL (ref 6.5–8.1)

## 2018-09-07 MED ORDER — ACETAMINOPHEN 325 MG PO TABS: 650.0000 mg | ORAL_TABLET | Freq: Once | ORAL | Status: DC

## 2018-09-07 MED ORDER — DIPHENHYDRAMINE HCL 25 MG PO CAPS
ORAL_CAPSULE | ORAL | Status: AC
  Filled 2018-09-07: qty 1

## 2018-09-07 MED ORDER — SODIUM CHLORIDE 0.9% FLUSH
10.0000 mL | INTRAVENOUS | Status: DC | PRN
  Filled 2018-09-07: qty 10

## 2018-09-07 MED ORDER — TRASTUZUMAB CHEMO 150 MG IV SOLR
6.0000 mg/kg | Freq: Once | INTRAVENOUS | Status: AC
  Administered 2018-09-07: 399 mg via INTRAVENOUS
  Filled 2018-09-07: qty 19

## 2018-09-07 MED ORDER — DIPHENHYDRAMINE HCL 25 MG PO CAPS
50.0000 mg | ORAL_CAPSULE | Freq: Once | ORAL | Status: AC
  Administered 2018-09-07: 50 mg via ORAL

## 2018-09-07 MED ORDER — HEPARIN SOD (PORK) LOCK FLUSH 100 UNIT/ML IV SOLN
500.0000 [IU] | Freq: Once | INTRAVENOUS | Status: AC | PRN
  Administered 2018-09-07: 500 [IU]
  Filled 2018-09-07: qty 5

## 2018-09-07 MED ORDER — SODIUM CHLORIDE 0.9% FLUSH
10.0000 mL | INTRAVENOUS | Status: DC | PRN
  Administered 2018-09-07: 10 mL
  Filled 2018-09-07: qty 10

## 2018-09-07 MED ORDER — SODIUM CHLORIDE 0.9 % IV SOLN
Freq: Once | INTRAVENOUS | Status: AC
  Administered 2018-09-07: 11:00:00 via INTRAVENOUS
  Filled 2018-09-07: qty 250

## 2018-09-07 NOTE — Progress Notes (Signed)
Patient Care Team: Bridget Hartshorn, NP as PCP - General (Adult Health Nurse Practitioner)  DIAGNOSIS:  Encounter Diagnosis  Name Primary?  . Metastatic breast cancer ([place])     SUMMARY OF ONCOLOGIC HISTORY:   Breast cancer of upper-outer quadrant of left female breast ([place])   11/14/2011 Surgery    Bilateral mastectomy, prophylactic on the right, left breast IDC 3/18 lymph nodes positive with extracapsular extension ER 89%, PR 81%, HER-2 negative, Ki-67 79% T2 N1 A. stage IIB    12/13/2011 - 06/28/2012 Chemotherapy    4 cycles of FEC followed by 4 cycles of Taxotere    07/17/2012 - 08/22/2012 Radiation Therapy    Adjuvant radiation therapy    08/22/2012 - 03/16/2016 Anti-estrogen oral therapy    Arimidex 1 mg daily    03/16/2016 Relapse/Recurrence    Subcutaneous nodule excision left chest: Infiltrating carcinoma breast primary, ER positive, PR negative    03/29/2016 Imaging    CT CAP and bone scan: Lytic lesions T8 vertebral, T1 posterior element, subcutaneous nodule left lateral chest wall, nonspecific lung nodules; Bone scan: Mets to kull, left humerus, left eighth rib, T7/T8, sternum, left acetabulum    04/28/2016 - 06/17/2017 Chemotherapy    Herceptin, lapatinib, Faslodex, Zometa every 4 weeks, lapatinib discontinued in September 2018 due to elevation of LFTs    06/22/2017 Relapse/Recurrence    Surgical excision:Soft tissue mass left lateral chest wall primary breast cancer, soft tissue mass left medial chest wall breast cancer, tumor is within the dermis extending to the subcutaneous adipose tissue and involves portions of skeletal muscle    08/2017 - 02/16/2018 Chemotherapy    Faslodex with Herceptin and Perjeta along with Zometa every 4 weeks     03/02/2018 - 05/25/2018 Chemotherapy    Kadcyla     05/11/2018 Imaging    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 Associated vasogenic edema within the underlying right cerebral hemisphere without  significant midline shift. Signal abnormality throughout the visualized bone marrow, compatible with osseous metastatic disease.       06/04/2018 Imaging    CT CAP: Right lower lobe lung nodule 7 mm (was 5 mm); multiple bone metastases throughout the spine and ribs sternum scapula and humerus, slightly increased lower thoracic mets, right renal lesion 2.5 cm (was 1.2 cm) right femur met increased from 2.1 cm to 2.9 cm    06/15/2018 -  Anti-estrogen oral therapy    Abemaciclib, Herceptin, letrozole, Xgeva     Metastatic breast cancer ([place])   06/29/2017 Initial Diagnosis    Metastatic breast cancer ([place])    06/05/2018 -  Chemotherapy    The patient had trastuzumab (HERCEPTIN) 546 mg in sodium chloride 0.9 % 250 mL chemo infusion, 8 mg/kg = 546 mg, Intravenous,  Once, 3 of 12 cycles Administration: 546 mg (06/15/2018), 399 mg (07/13/2018), 399 mg (08/10/2018)  for chemotherapy treatment.      CHIEF COMPLIANT: Follow-up to review scans, Herceptin, abemaciclib, letrozole and Xgeva  INTERVAL HISTORY: [name] Riley is a 67 year old with above-mentioned some metastatic breast cancer currently on Abemaciclib with Herceptin letrozole and Xgeva.  She is tolerating the treatment extremely well.  She does have fatigue.  She also has a rash on her arms probably because of dryness of the skin.  Denies any significant diarrhea.  REVIEW OF SYSTEMS:   Constitutional: Denies fevers, chills or abnormal weight loss Eyes: Denies blurriness of vision Ears, nose, mouth, throat, and face: Denies mucositis or sore throat Respiratory: Denies cough, dyspnea  or wheezes Cardiovascular: Denies palpitation, chest discomfort Gastrointestinal:  Denies nausea, heartburn or change in bowel habits Skin: Denies abnormal skin rashes Lymphatics: Denies new lymphadenopathy or easy bruising Neurological:Denies numbness, tingling or new weaknesses Behavioral/Psych: Mood is stable, no new changes  Extremities: No lower  extremity edema Breast:  denies any pain or lumps or nodules in either breasts All other systems were reviewed with the patient and are negative.  I have reviewed the past medical history, past surgical history, social history and family history with the patient and they are unchanged from previous note.  ALLERGIES:  is allergic to cantaloupe extract allergy skin test; contrast media [iodinated diagnostic agents]; pravastatin; and zosyn [piperacillin sod-tazobactam so].  MEDICATIONS:  Current Outpatient Medications  Medication Sig Dispense Refill  . acetaminophen (TYLENOL) 500 MG tablet Take 1,000 mg by mouth every 6 (six) hours as needed for moderate pain.    Marland Kitchen aspirin 81 MG tablet Take 81 mg by mouth daily.      . cholecalciferol (VITAMIN D) 1000 units tablet Take 1 tablet (1,000 Units total) by mouth daily.    . diphenhydrAMINE (BENADRYL) 25 MG tablet Take 50 mg by mouth at bedtime as needed for sleep.    Marland Kitchen letrozole (FEMARA) 2.5 MG tablet Take 1 tablet (2.5 mg total) by mouth daily. 90 tablet 3  . levETIRAcetam (KEPPRA) 500 MG tablet Take 1 tablet (500 mg total) by mouth 2 (two) times daily. 60 tablet 3  . levothyroxine (SYNTHROID, LEVOTHROID) 100 MCG tablet Take 100 mcg by mouth daily before breakfast. BRAND ONLY    . lidocaine-prilocaine (EMLA) cream Apply topically as needed (for port access). Apply to affected area once 30 g 3  . metoprolol succinate (TOPROL-XL) 100 MG 24 hr tablet Take 1 tablet (100 mg total) by mouth daily. 90 tablet 3  . Multiple Vitamins-Minerals (MULTIVITAMIN WITH MINERALS) tablet Take 1 tablet by mouth daily.      . ondansetron (ZOFRAN-ODT) 4 MG disintegrating tablet Take 1 tablet (4 mg total) by mouth every 6 (six) hours as needed for nausea or vomiting. 30 tablet 3  . predniSONE (DELTASONE) 50 MG tablet Take 1 67m tablet by mouth 13 hrs, 7hrs, and 1 hr prior to CT Scan. (CT dye allergy protocol) 3 tablet 0  . VERZENIO 150 MG tablet TAKE 1 TABLET (150 MG TOTAL)  BY MOUTH 2 (TWO) TIMES DAILY. SWALLOW TABLETS WHOLE. DO NOT CHEW, CRUSH, OR SPLIT TABLETS BEFORE SWALLOWING. 56 tablet 1   No current facility-administered medications for this visit.    Facility-Administered Medications Ordered in Other Visits  Medication Dose Route Frequency Provider Last Rate Last Dose  . 0.9 %  sodium chloride infusion   Intravenous Continuous TDonnie Mesa MD      . acetaminophen (TYLENOL) tablet 975 mg  975 mg Oral Q6H TDonnie Mesa MD      . enoxaparin (LOVENOX) injection 40 mg  40 mg Subcutaneous Q24H TDonnie Mesa MD      . morphine 4 MG/ML injection 2-4 mg  2-4 mg Intravenous Q2H PRN TDonnie Mesa MD      . ondansetron (ZOFRAN-ODT) disintegrating tablet 4 mg  4 mg Oral Q6H PRN TDonnie Mesa MD       Or  . ondansetron (ZOFRAN) 4 mg in sodium chloride 0.9 % 50 mL IVPB  4 mg Intravenous Q6H PRN TDonnie Mesa MD      . oxyCODONE (Oxy IR/ROXICODONE) immediate release tablet 5-10 mg  5-10 mg Oral Q4H PRN TDonnie Mesa MD      .  piperacillin-tazobactam (ZOSYN) 3.375 g in dextrose 5 % 50 mL IVPB  3.375 g Intravenous Q8H Donnie Mesa, MD      . prochlorperazine (COMPAZINE) tablet 10 mg  10 mg Oral Q6H PRN Donnie Mesa, MD       Or  . prochlorperazine (COMPAZINE) injection 5-10 mg  5-10 mg Intravenous Q6H PRN Donnie Mesa, MD      . sodium chloride flush (NS) 0.9 % injection 10 mL  10 mL Intravenous PRN Nicholas Lose, MD        PHYSICAL EXAMINATION: ECOG PERFORMANCE STATUS: 1 - Symptomatic but completely ambulatory  Vitals:   09/07/18 1005  BP: (!) 159/92  Pulse: 86  Resp: 16  Temp: 97.9 F (36.6 C)  SpO2: 100%   Filed Weights   09/07/18 1005  Weight: 142 lb 12.8 oz (64.8 kg)    GENERAL:alert, no distress and comfortable SKIN: skin color, texture, turgor are normal, no rashes or significant lesions EYES: normal, Conjunctiva are pink and non-injected, sclera clear OROPHARYNX:no exudate, no erythema and lips, buccal mucosa, and tongue normal   NECK: supple, thyroid normal size, non-tender, without nodularity LYMPH:  no palpable lymphadenopathy in the cervical, axillary or inguinal LUNGS: clear to auscultation and percussion with normal breathing effort HEART: regular rate & rhythm and no murmurs and no lower extremity edema ABDOMEN:abdomen soft, non-tender and normal bowel sounds MUSCULOSKELETAL:no cyanosis of digits and no clubbing  NEURO: alert & oriented x 3 with fluent speech, no focal motor/sensory deficits EXTREMITIES: No lower extremity edema   LABORATORY DATA:  I have reviewed the data as listed CMP Latest Ref Rng & Units 09/06/2018 08/10/2018 08/06/2018  Glucose 70 - 99 mg/dL 143(H) 101(H) -  BUN 8 - 23 mg/dL 15 10 -  Creatinine 0.44 - 1.00 mg/dL 0.96 0.78 0.74  Sodium 135 - 145 mmol/L 139 141 -  Potassium 3.5 - 5.1 mmol/L 3.9 3.4(L) -  Chloride 98 - 111 mmol/L 103 104 -  CO2 22 - 32 mmol/L 26 28 -  Calcium 8.9 - 10.3 mg/dL 10.2 9.7 -  Total Protein 6.5 - 8.1 g/dL 7.7 7.1 -  Total Bilirubin 0.3 - 1.2 mg/dL 0.6 0.4 -  Alkaline Phos 38 - 126 U/L 139(H) 132(H) -  AST 15 - 41 U/L 34 40 -  ALT 0 - 44 U/L 23 25 -    Lab Results  Component Value Date   WBC 7.9 09/07/2018   HGB 9.4 (L) 09/07/2018   HCT 27.8 (L) 09/07/2018   MCV 99.6 09/07/2018   PLT 123 (L) 09/07/2018   NEUTROABS 4.4 09/07/2018    ASSESSMENT & PLAN:  Metastatic breast cancer (HCC) Left breast invasive ductal carcinomaT2, N1, 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 Was on Arimidex since 08/22/2012 to 03/30/16 Recent treatment: Kadcyla Subcutaneous nodule excisionleft chest: Infiltrating carcinoma breast primary, ER positive, PR negative, HER-2 positive  06/04/2018:CTCAP andBone scan: Progression of metastatic disease involving the bone, renal lesion and lung nodule  Current treatment:  Abemaciclibwith letrozole along with Herceptin(to be given every 4 weeks)and Xgeva  Brain MRI showing dural based metastases: Because the patient is asymptomatic we decided to not perform any procedures at this time.  Repeat brain MRI done at this week showed marked improvement in the dural metastases.  Abemaciclib toxicities 1.Diarrhea alternating with constipation: Markedly improved 2.Thrombocytopenia:Platelet count is increased  to 149 3.Anemia due to therapy: Hemoglobin is 9.9 and we are watching and monitoring it.  CT chest abdomen pelvis: 09/06/2018 interval resolution of the right renal lesions, no substantial change in the 6 mm right lower lobe lung nodule, stable bone metastases There are areas of segmental edema concerning for pyelonephritis or embolic disease  Radiology review I discussed with the patient that the scans are showing stable findings.  Treatment plan: Continue with current treatment with abemaciclib and letrozole along with Herceptin and Xgeva being given once every 4 weeks  Return to clinic in 4 weeks for next treatment and follow-up    No orders of the defined types were placed in this encounter.  The patient has a good understanding of the overall plan. she agrees with it. she will call with any problems that may develop before the next visit here.   Harriette Ohara, MD 09/07/18

## 2018-09-07 NOTE — Patient Instructions (Signed)
[facility] Discharge Instructions for Patients Receiving Chemotherapy  Today you received the following chemotherapy agent: Herceptin  To help prevent nausea and vomiting after your treatment, we encourage you to take your nausea medication as directed.   If you develop nausea and vomiting that is not controlled by your nausea medication, call the clinic.   BELOW ARE SYMPTOMS THAT SHOULD BE REPORTED IMMEDIATELY:  *FEVER GREATER THAN 100.5 F  *CHILLS WITH OR WITHOUT FEVER  NAUSEA AND VOMITING THAT IS NOT CONTROLLED WITH YOUR NAUSEA MEDICATION  *UNUSUAL SHORTNESS OF BREATH  *UNUSUAL BRUISING OR BLEEDING  TENDERNESS IN MOUTH AND THROAT WITH OR WITHOUT PRESENCE OF ULCERS  *URINARY PROBLEMS  *BOWEL PROBLEMS  UNUSUAL RASH Items with * indicate a potential emergency and should be followed up as soon as possible.  Feel free to call the clinic should you have any questions or concerns. The clinic phone number is [phone].  Please show the CHEMO ALERT CARD at check-in to the Emergency Department and triage nurse.   

## 2018-09-07 NOTE — Telephone Encounter (Signed)
Gave avs and calendar ° °

## 2018-09-07 NOTE — Assessment & Plan Note (Signed)
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 Was on Arimidex since 08/22/2012 to 03/30/16 Recent treatment: Kadcyla Subcutaneous nodule excisionleft chest: Infiltrating carcinoma breast primary, ER positive, PR negative, HER-2 positive  06/04/2018:CTCAP andBone scan: Progression of metastatic disease involving the bone, renal lesion and lung nodule  Current treatment: Abemaciclibwith letrozole along with Herceptin(to be given every 4 weeks)and Xgeva  Brain MRI showing dural based metastases: Because the patient is asymptomatic we decided to not perform any procedures at this time.  Repeat brain MRI done at this week showed marked improvement in the dural metastases.  Abemaciclib toxicities 1.Diarrhea alternating with constipation: Markedly improved 2.Thrombocytopenia:Platelet count is increased to 149 3.Anemia due to therapy: Hemoglobin is 9.9 and we are watching and monitoring it.  CT chest abdomen pelvis: 09/06/2018 interval resolution of the right renal lesions, no substantial change in the 6 mm right lower lobe lung nodule, stable bone metastases There are areas of segmental edema concerning for pyelonephritis or embolic disease  Radiology review I discussed with the patient that the scans are showing stable findings.  Treatment plan: Continue with current treatment with abemaciclib and letrozole along with Herceptin and Xgeva being given once every 4 weeks  Return to clinic in 4 weeks for next treatment and follow-up

## 2018-09-24 ENCOUNTER — Other Ambulatory Visit: Payer: Self-pay

## 2018-09-24 MED ORDER — METOPROLOL SUCCINATE ER 100 MG PO TB24: 100.0000 mg | ORAL_TABLET | Freq: Every day | ORAL | 0 refills | Status: DC

## 2018-09-26 ENCOUNTER — Other Ambulatory Visit: Payer: Self-pay | Admitting: Hematology and Oncology

## 2018-09-27 ENCOUNTER — Other Ambulatory Visit: Payer: Self-pay | Admitting: Hematology and Oncology

## 2018-09-27 DIAGNOSIS — C50412 Malignant neoplasm of upper-outer quadrant of left female breast: Secondary | ICD-10-CM

## 2018-09-27 DIAGNOSIS — Z170 Estrogen receptor positive status [ER+]: Secondary | ICD-10-CM

## 2018-10-05 ENCOUNTER — Ambulatory Visit: Payer: Medicare Other | Admitting: Hematology and Oncology

## 2018-10-05 ENCOUNTER — Other Ambulatory Visit: Payer: Medicare Other

## 2018-10-05 ENCOUNTER — Ambulatory Visit: Payer: Medicare Other

## 2018-10-12 ENCOUNTER — Inpatient Hospital Stay: Payer: Medicare Other

## 2018-10-12 ENCOUNTER — Inpatient Hospital Stay (HOSPITAL_BASED_OUTPATIENT_CLINIC_OR_DEPARTMENT_OTHER): Payer: Medicare Other | Admitting: Hematology and Oncology

## 2018-10-12 ENCOUNTER — Telehealth: Payer: Self-pay

## 2018-10-12 ENCOUNTER — Inpatient Hospital Stay: Payer: Medicare Other | Attending: Hematology and Oncology

## 2018-10-12 VITALS — BP 161/84 | HR 79 | Temp 98.4°F | Resp 20 | Ht 68.0 in | Wt 140.4 lb

## 2018-10-12 DIAGNOSIS — Z170 Estrogen receptor positive status [ER+]: Secondary | ICD-10-CM

## 2018-10-12 DIAGNOSIS — R5383 Other fatigue: Secondary | ICD-10-CM | POA: Insufficient documentation

## 2018-10-12 DIAGNOSIS — C50412 Malignant neoplasm of upper-outer quadrant of left female breast: Secondary | ICD-10-CM

## 2018-10-12 DIAGNOSIS — C7951 Secondary malignant neoplasm of bone: Secondary | ICD-10-CM

## 2018-10-12 DIAGNOSIS — R197 Diarrhea, unspecified: Secondary | ICD-10-CM

## 2018-10-12 DIAGNOSIS — Z79899 Other long term (current) drug therapy: Secondary | ICD-10-CM | POA: Insufficient documentation

## 2018-10-12 DIAGNOSIS — C7800 Secondary malignant neoplasm of unspecified lung: Secondary | ICD-10-CM | POA: Diagnosis not present

## 2018-10-12 DIAGNOSIS — D649 Anemia, unspecified: Secondary | ICD-10-CM | POA: Diagnosis not present

## 2018-10-12 DIAGNOSIS — D696 Thrombocytopenia, unspecified: Secondary | ICD-10-CM

## 2018-10-12 DIAGNOSIS — M25561 Pain in right knee: Secondary | ICD-10-CM

## 2018-10-12 DIAGNOSIS — Z5112 Encounter for antineoplastic immunotherapy: Secondary | ICD-10-CM

## 2018-10-12 DIAGNOSIS — C7931 Secondary malignant neoplasm of brain: Secondary | ICD-10-CM | POA: Insufficient documentation

## 2018-10-12 DIAGNOSIS — E876 Hypokalemia: Secondary | ICD-10-CM | POA: Insufficient documentation

## 2018-10-12 DIAGNOSIS — C50919 Malignant neoplasm of unspecified site of unspecified female breast: Secondary | ICD-10-CM

## 2018-10-12 DIAGNOSIS — Z923 Personal history of irradiation: Secondary | ICD-10-CM

## 2018-10-12 DIAGNOSIS — Z9221 Personal history of antineoplastic chemotherapy: Secondary | ICD-10-CM | POA: Diagnosis not present

## 2018-10-12 DIAGNOSIS — L299 Pruritus, unspecified: Secondary | ICD-10-CM | POA: Diagnosis not present

## 2018-10-12 DIAGNOSIS — Z7982 Long term (current) use of aspirin: Secondary | ICD-10-CM

## 2018-10-12 DIAGNOSIS — Z95828 Presence of other vascular implants and grafts: Secondary | ICD-10-CM

## 2018-10-12 LAB — CBC WITH DIFFERENTIAL (CANCER CENTER ONLY)
Abs Immature Granulocytes: 0.02 10*3/uL (ref 0.00–0.07)
Basophils Absolute: 0.1 10*3/uL (ref 0.0–0.1)
Basophils Relative: 2 %
Eosinophils Absolute: 0 10*3/uL (ref 0.0–0.5)
Eosinophils Relative: 1 %
HCT: 30.4 % — ABNORMAL LOW (ref 36.0–46.0)
Hemoglobin: 10.3 g/dL — ABNORMAL LOW (ref 12.0–15.0)
Immature Granulocytes: 1 %
Lymphocytes Relative: 41 %
Lymphs Abs: 1.4 10*3/uL (ref 0.7–4.0)
MCH: 34.6 pg — ABNORMAL HIGH (ref 26.0–34.0)
MCHC: 33.9 g/dL (ref 30.0–36.0)
MCV: 102 fL — ABNORMAL HIGH (ref 80.0–100.0)
Monocytes Absolute: 0.3 10*3/uL (ref 0.1–1.0)
Monocytes Relative: 8 %
Neutro Abs: 1.6 10*3/uL — ABNORMAL LOW (ref 1.7–7.7)
Neutrophils Relative %: 47 %
Platelet Count: 100 10*3/uL — ABNORMAL LOW (ref 150–400)
RBC: 2.98 MIL/uL — ABNORMAL LOW (ref 3.87–5.11)
RDW: 13.1 % (ref 11.5–15.5)
WBC Count: 3.3 10*3/uL — ABNORMAL LOW (ref 4.0–10.5)
nRBC: 0 % (ref 0.0–0.2)

## 2018-10-12 LAB — CMP (CANCER CENTER ONLY)
ALT: 27 U/L (ref 0–44)
AST: 36 U/L (ref 15–41)
Albumin: 3.8 g/dL (ref 3.5–5.0)
Alkaline Phosphatase: 113 U/L (ref 38–126)
Anion gap: 9 (ref 5–15)
BUN: 12 mg/dL (ref 8–23)
CO2: 29 mmol/L (ref 22–32)
Calcium: 9.3 mg/dL (ref 8.9–10.3)
Chloride: 103 mmol/L (ref 98–111)
Creatinine: 0.89 mg/dL (ref 0.44–1.00)
GFR, Est AFR Am: >60 mL/min (ref >60)
GFR, Estimated: >60 mL/min (ref >60)
Glucose, Bld: 153 mg/dL — ABNORMAL HIGH (ref 70–99)
Potassium: 3.4 mmol/L — ABNORMAL LOW (ref 3.5–5.1)
Sodium: 141 mmol/L (ref 135–145)
Total Bilirubin: 0.4 mg/dL (ref 0.3–1.2)
Total Protein: 7 g/dL (ref 6.5–8.1)

## 2018-10-12 MED ORDER — HEPARIN SOD (PORK) LOCK FLUSH 100 UNIT/ML IV SOLN
500.0000 [IU] | Freq: Once | INTRAVENOUS | Status: DC | PRN
  Filled 2018-10-12: qty 5

## 2018-10-12 MED ORDER — SODIUM CHLORIDE 0.9% FLUSH
10.0000 mL | INTRAVENOUS | Status: DC | PRN
  Filled 2018-10-12: qty 10

## 2018-10-12 MED ORDER — TRASTUZUMAB CHEMO 150 MG IV SOLR
6.0000 mg/kg | Freq: Once | INTRAVENOUS | Status: AC
  Administered 2018-10-12: 399 mg via INTRAVENOUS
  Filled 2018-10-12: qty 19

## 2018-10-12 MED ORDER — SODIUM CHLORIDE 0.9 % IV SOLN
Freq: Once | INTRAVENOUS | Status: AC
  Administered 2018-10-12: 12:00:00 via INTRAVENOUS
  Filled 2018-10-12: qty 250

## 2018-10-12 MED ORDER — DIPHENHYDRAMINE HCL 25 MG PO CAPS
50.0000 mg | ORAL_CAPSULE | Freq: Once | ORAL | Status: AC
  Administered 2018-10-12: 50 mg via ORAL

## 2018-10-12 MED ORDER — SODIUM CHLORIDE 0.9% FLUSH
10.0000 mL | Freq: Once | INTRAVENOUS | Status: AC
  Administered 2018-10-12: 10 mL
  Filled 2018-10-12: qty 10

## 2018-10-12 MED ORDER — ACETAMINOPHEN 325 MG PO TABS
650.0000 mg | ORAL_TABLET | Freq: Once | ORAL | Status: AC
  Administered 2018-10-12: 650 mg via ORAL

## 2018-10-12 MED FILL — VERZENIO 150 MG TAB: 150 | 28 days supply | Qty: 56 | Fill #0

## 2018-10-12 NOTE — Telephone Encounter (Signed)
Oral Oncology Patient Advocate Encounter  [name] Riley brought in his bank statement that showed how much was deposited into their account from both [employer] checks. I made a copy of it and I faxed the application today 66/59/93.  This encounter will be updated until final determination  [name] Riley Phone [phone] Fax [phone]

## 2018-10-12 NOTE — Patient Instructions (Addendum)
[facility] Discharge Instructions for Patients Receiving Chemotherapy  Today you received the following chemotherapy agent:  Herceptin.  To help prevent nausea and vomiting after your treatment, we encourage you to take your nausea medication as directed.   If you develop nausea and vomiting that is not controlled by your nausea medication, call the clinic.   BELOW ARE SYMPTOMS THAT SHOULD BE REPORTED IMMEDIATELY:  *FEVER GREATER THAN 100.5 F  *CHILLS WITH OR WITHOUT FEVER  NAUSEA AND VOMITING THAT IS NOT CONTROLLED WITH YOUR NAUSEA MEDICATION  *UNUSUAL SHORTNESS OF BREATH  *UNUSUAL BRUISING OR BLEEDING  TENDERNESS IN MOUTH AND THROAT WITH OR WITHOUT PRESENCE OF ULCERS  *URINARY PROBLEMS  *BOWEL PROBLEMS  UNUSUAL RASH Items with * indicate a potential emergency and should be followed up as soon as possible.  Feel free to call the clinic should you have any questions or concerns. The clinic phone number is [phone].  Please show the [facility] at check-in to the Emergency Department and triage nurse.      Thrombocytopenia  Thrombocytopenia means that you have a low number of platelets in your blood. Platelets are tiny cells in the blood. When you bleed, they clump together at the cut or injury to stop the bleeding. This is called blood clotting. Not having enough platelets can cause bleeding problems. Follow these instructions at home: General instructions  Check your skin and inside your mouth for bruises or blood as told by your doctor.  Check to see if there is blood in your spit (sputum), pee (urine), and poop (stool). Do this as told by your doctor.  Ask your doctor if you can drink alcohol.  Take over-the-counter and prescription medicines only as told by your doctor.  Tell all of your doctors that you have this condition. Be sure to tell your dentist and eye doctor too. Activity  Do not do activities that can cause bumps or  bruises until your doctor says it is okay.  Be careful not to cut yourself: ? When you shave. ? When you use scissors, needles, knives, or other tools.  Be careful not to burn yourself: ? When you use an iron. ? When you cook. Contact a doctor if:  You have bruises and you do not know why. Get help right away if:  You are bleeding anywhere on your body.  You have blood in your spit, pee, or poop. This information is not intended to replace advice given to you by your health care provider. Make sure you discuss any questions you have with your health care provider. Document Released: 09/22/2011 Document Revised: 06/05/2016 Document Reviewed: 04/06/2015 Elsevier Interactive Patient Education  2019 [employer].

## 2018-10-12 NOTE — Progress Notes (Signed)
Patient Care Team: Bridget Hartshorn, NP as PCP - General (Adult Health Nurse Practitioner)  DIAGNOSIS:  Encounter Diagnoses  Name Primary?  . 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 Metastatic breast cancer ([place])   . Malignant neoplasm of upper-outer quadrant of left female breast, unspecified estrogen receptor status ([place])     SUMMARY OF ONCOLOGIC HISTORY:   Breast cancer of upper-outer quadrant of left female breast ([place])   11/14/2011 Surgery    Bilateral mastectomy, prophylactic on the right, left breast IDC 3/18 lymph nodes positive with extracapsular extension ER 89%, PR 81%, HER-2 negative, Ki-67 79% T2 N1 A. stage IIB    12/13/2011 - 06/28/2012 Chemotherapy    4 cycles of FEC followed by 4 cycles of Taxotere    07/17/2012 - 08/22/2012 Radiation Therapy    Adjuvant radiation therapy    08/22/2012 - 03/16/2016 Anti-estrogen oral therapy    Arimidex 1 mg daily    03/16/2016 Relapse/Recurrence    Subcutaneous nodule excision left chest: Infiltrating carcinoma breast primary, ER positive, PR negative    03/29/2016 Imaging    CT CAP and bone scan: Lytic lesions T8 vertebral, T1 posterior element, subcutaneous nodule left lateral chest wall, nonspecific lung nodules; Bone scan: Mets to kull, left humerus, left eighth rib, T7/T8, sternum, left acetabulum    04/28/2016 - 06/17/2017 Chemotherapy    Herceptin, lapatinib, Faslodex, Zometa every 4 weeks, lapatinib discontinued in September 2018 due to elevation of LFTs    06/22/2017 Relapse/Recurrence    Surgical excision:Soft tissue mass left lateral chest wall primary breast cancer, soft tissue mass left medial chest wall breast cancer, tumor is within the dermis extending to the subcutaneous adipose tissue and involves portions of skeletal muscle    08/2017 - 02/16/2018 Chemotherapy    Faslodex with Herceptin and Perjeta along with Zometa every 4 weeks     03/02/2018 - 05/25/2018 Chemotherapy    Kadcyla     05/11/2018 Imaging    Dural-based  metastasis overlying the right frontoparietal convexity. Associated vasogenic edema within the underlying right cerebral hemisphere without significant midline shift. Signal abnormality throughout the visualized bone marrow, compatible with osseous metastatic disease.       06/04/2018 Imaging    CT CAP: Right lower lobe lung nodule 7 mm (was 5 mm); multiple bone metastases throughout the spine and ribs sternum scapula and humerus, slightly increased lower thoracic mets, right renal lesion 2.5 cm (was 1.2 cm) right femur met increased from 2.1 cm to 2.9 cm    06/15/2018 -  Anti-estrogen oral therapy    Abemaciclib, Herceptin, letrozole, Xgeva     Metastatic breast cancer ([place])   06/29/2017 Initial Diagnosis    Metastatic breast cancer ([place])    06/15/2018 -  Chemotherapy    The patient had trastuzumab (HERCEPTIN) 546 mg in sodium chloride 0.9 % 250 mL chemo infusion, 8 mg/kg = 546 mg, Intravenous,  Once, 4 of 12 cycles Administration: 546 mg (06/15/2018), 399 mg (09/07/2018), 399 mg (07/13/2018), 399 mg (08/10/2018)  for chemotherapy treatment.      CHIEF COMPLIANT: Follow-up on Herceptin with abemaciclib and letrozole with Delton See  INTERVAL HISTORY: [name] Riley is a 67 year old with above-mentioned history metastatic breast cancer currently on abemaciclib with letrozole along with Herceptin Xgeva.  She is tolerating the treatment extremely well.  She does not have any nausea or vomiting.  She has occasional episodes of diarrhea which responds to Imodium.  She was slightly hypokalemic and has been eating more potassium containing  foods.  Mild fatigue.  She banged her knee on a table and is having some right knee pain.  She also complains of itching sensation on the left side of the neck from prior shingles and she appears to be scratching it quite often.  She is taking Benadryl and applying cortisone cream.  REVIEW OF SYSTEMS:   Constitutional: Denies fevers, chills or abnormal weight loss Eyes:  Denies blurriness of vision Ears, nose, mouth, throat, and face: Denies mucositis or sore throat Respiratory: Denies cough, dyspnea or wheezes Cardiovascular: Denies palpitation, chest discomfort Gastrointestinal:  Denies nausea, heartburn or change in bowel habits Skin: Denies abnormal skin rashes Lymphatics: Denies new lymphadenopathy or easy bruising Neurological:Denies numbness, tingling or new weaknesses Behavioral/Psych: Mood is stable, no new changes  Extremities: No lower extremity edema   All other systems were reviewed with the patient and are negative.  I have reviewed the past medical history, past surgical history, social history and family history with the patient and they are unchanged from previous note.  ALLERGIES:  is allergic to cantaloupe extract allergy skin test; contrast media [iodinated diagnostic agents]; pravastatin; and zosyn [piperacillin sod-tazobactam so].  MEDICATIONS:  Current Outpatient Medications  Medication Sig Dispense Refill  . acetaminophen (TYLENOL) 500 MG tablet Take 1,000 mg by mouth every 6 (six) hours as needed for moderate pain.    Marland Kitchen aspirin 81 MG tablet Take 81 mg by mouth daily.      . cholecalciferol (VITAMIN D) 1000 units tablet Take 1 tablet (1,000 Units total) by mouth daily.    . diphenhydrAMINE (BENADRYL) 25 MG tablet Take 50 mg by mouth at bedtime as needed for sleep.    Marland Kitchen letrozole (FEMARA) 2.5 MG tablet Take 1 tablet (2.5 mg total) by mouth daily. 90 tablet 3  . levETIRAcetam (KEPPRA) 500 MG tablet Take 1 tablet (500 mg total) by mouth 2 (two) times daily. 60 tablet 3  . levothyroxine (SYNTHROID, LEVOTHROID) 100 MCG tablet Take 100 mcg by mouth daily before breakfast. BRAND ONLY    . lidocaine-prilocaine (EMLA) cream Apply topically as needed (for port access). Apply to affected area once 30 g 3  . metoprolol succinate (TOPROL-XL) 100 MG 24 hr tablet Take 1 tablet (100 mg total) by mouth daily. 90 tablet 0  . Multiple  Vitamins-Minerals (MULTIVITAMIN WITH MINERALS) tablet Take 1 tablet by mouth daily.      . ondansetron (ZOFRAN-ODT) 4 MG disintegrating tablet Take 1 tablet (4 mg total) by mouth every 6 (six) hours as needed for nausea or vomiting. 30 tablet 3  . predniSONE (DELTASONE) 50 MG tablet Take 1 76m tablet by mouth 13 hrs, 7hrs, and 1 hr prior to CT Scan. (CT dye allergy protocol) 3 tablet 0  . VERZENIO 150 MG tablet TAKE 1 TABLET (150 MG TOTAL) BY MOUTH 2 (TWO) TIMES DAILY. SWALLOW TABLETS WHOLE. DO NOT CHEW, CRUSH, OR SPLIT TABLETS BEFORE SWALLOWING. 56 tablet 1   No current facility-administered medications for this visit.    Facility-Administered Medications Ordered in Other Visits  Medication Dose Route Frequency Provider Last Rate Last Dose  . 0.9 %  sodium chloride infusion   Intravenous Continuous TDonnie Mesa MD      . acetaminophen (TYLENOL) tablet 975 mg  975 mg Oral Q6H TDonnie Mesa MD      . enoxaparin (LOVENOX) injection 40 mg  40 mg Subcutaneous Q24H TDonnie Mesa MD      . morphine 4 MG/ML injection 2-4 mg  2-4 mg Intravenous Q2H PRN  Donnie Mesa, MD      . ondansetron (ZOFRAN-ODT) disintegrating tablet 4 mg  4 mg Oral Q6H PRN Donnie Mesa, MD       Or  . ondansetron (ZOFRAN) 4 mg in sodium chloride 0.9 % 50 mL IVPB  4 mg Intravenous Q6H PRN Donnie Mesa, MD      . oxyCODONE (Oxy IR/ROXICODONE) immediate release tablet 5-10 mg  5-10 mg Oral Q4H PRN Donnie Mesa, MD      . piperacillin-tazobactam (ZOSYN) 3.375 g in dextrose 5 % 50 mL IVPB  3.375 g Intravenous Q8H Donnie Mesa, MD      . prochlorperazine (COMPAZINE) tablet 10 mg  10 mg Oral Q6H PRN Donnie Mesa, MD       Or  . prochlorperazine (COMPAZINE) injection 5-10 mg  5-10 mg Intravenous Q6H PRN Donnie Mesa, MD        PHYSICAL EXAMINATION: ECOG PERFORMANCE STATUS: 1 - Symptomatic but completely ambulatory  Vitals:   10/12/18 1004  BP: (!) 161/84  Pulse: 79  Resp: 20  Temp: 98.4 F (36.9 C)  SpO2:  100%   Filed Weights   10/12/18 1004  Weight: 140 lb 6.4 oz (63.7 kg)    GENERAL:alert, no distress and comfortable SKIN: Skin itching EYES: normal, Conjunctiva are pink and non-injected, sclera clear OROPHARYNX:no exudate, no erythema and lips, buccal mucosa, and tongue normal  NECK: supple, thyroid normal size, non-tender, without nodularity LYMPH:  no palpable lymphadenopathy in the cervical, axillary or inguinal LUNGS: clear to auscultation and percussion with normal breathing effort HEART: regular rate & rhythm and no murmurs and no lower extremity edema ABDOMEN:abdomen soft, non-tender and normal bowel sounds MUSCULOSKELETAL:no cyanosis of digits and no clubbing  NEURO: alert & oriented x 3 with fluent speech, no focal motor/sensory deficits EXTREMITIES: No lower extremity edema  LABORATORY DATA:  I have reviewed the data as listed CMP Latest Ref Rng & Units 09/07/2018 09/06/2018 08/10/2018  Glucose 70 - 99 mg/dL 97 143(H) 101(H)  BUN 8 - 23 mg/dL 17 15 10   Creatinine 0.44 - 1.00 mg/dL 0.89 0.96 0.78  Sodium 135 - 145 mmol/L 138 139 141  Potassium 3.5 - 5.1 mmol/L 3.2(L) 3.9 3.4(L)  Chloride 98 - 111 mmol/L 102 103 104  CO2 22 - 32 mmol/L 27 26 28   Calcium 8.9 - 10.3 mg/dL 10.1 10.2 9.7  Total Protein 6.5 - 8.1 g/dL 7.2 7.7 7.1  Total Bilirubin 0.3 - 1.2 mg/dL 0.5 0.6 0.4  Alkaline Phos 38 - 126 U/L 117 139(H) 132(H)  AST 15 - 41 U/L 35 34 40  ALT 0 - 44 U/L 24 23 25     Lab Results  Component Value Date   WBC 3.3 (L) 10/12/2018   HGB 10.3 (L) 10/12/2018   HCT 30.4 (L) 10/12/2018   MCV 102.0 (H) 10/12/2018   PLT 100 (L) 10/12/2018   NEUTROABS 1.6 (L) 10/12/2018    ASSESSMENT & PLAN:  Breast cancer of upper-outer quadrant of left female breast ([place]) Left breast invasive ductal carcinomaT2, N1, M0 stage IIB 3 of 18 lymph nodes positive with extracapsular extension ER 89% PR 81% HER-2 negative Ki-67 79% status post 4 cycles of FEC and 4 cycles of Taxotere and  adjuvant radiation. Was on Arimidex since 08/22/2012 to 03/30/16 Recent treatment: Kadcyla Subcutaneous nodule excisionleft chest: Infiltrating carcinoma breast primary, ER positive, PR negative, HER-2 positive  06/04/2018:CTCAP andBone scan: Progression of metastatic disease involving the bone, renal lesion and lung nodule  Current treatment: Abemaciclibwith letrozole  along with Herceptin(to be given every 4 weeks)and Xgeva  Brain MRI showing dural based metastases: Because the patient is asymptomatic we decided to not perform any procedures at this time.Repeat brain MRI done at this week showed marked improvement in the dural metastases.  Abemaciclib toxicities 1.Diarrhea alternating with constipation: Markedly improved 2.Thrombocytopenia:Platelet count is 100 3.Anemia due to therapy: Hemoglobin is10.4and we are watching and monitoring it.  CT chest abdomen pelvis: 09/06/2018 interval resolution of the right renal lesions, no substantial change in the 6 mm right lower lobe lung nodule, stable bone metastases There are areas of segmental edema concerning for pyelonephritis or embolic disease  Treatment plan: Continue with current treatment with abemaciclib and letrozole along with Herceptin and Xgeva being given once every 4 weeks  Return to clinic in 4 weeks for next treatment and follow-up    No orders of the defined types were placed in this encounter.  The patient has a good understanding of the overall plan. she agrees with it. she will call with any problems that may develop before the next visit here.   Harriette Ohara, MD 10/12/18

## 2018-10-12 NOTE — Assessment & Plan Note (Signed)
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 Was on Arimidex since 08/22/2012 to 03/30/16 Recent treatment: Kadcyla Subcutaneous nodule excisionleft chest: Infiltrating carcinoma breast primary, ER positive, PR negative, HER-2 positive  06/04/2018:CTCAP andBone scan: Progression of metastatic disease involving the bone, renal lesion and lung nodule  Current treatment: Abemaciclibwith letrozole along with Herceptin(to be given every 4 weeks)and Xgeva  Brain MRI showing dural based metastases: Because the patient is asymptomatic we decided to not perform any procedures at this time.Repeat brain MRI done at this week showed marked improvement in the dural metastases.  Abemaciclib toxicities 1.Diarrhea alternating with constipation: Markedly improved 2.Thrombocytopenia:Platelet count is increased to 149 3.Anemia due to therapy: Hemoglobin is9.9and we are watching and monitoring it.  CT chest abdomen pelvis: 09/06/2018 interval resolution of the right renal lesions, no substantial change in the 6 mm right lower lobe lung nodule, stable bone metastases There are areas of segmental edema concerning for pyelonephritis or embolic disease  Radiology review I discussed with the patient that the scans are showing stable findings.  Treatment plan: Continue with current treatment with abemaciclib and letrozole along with Herceptin and Xgeva being given once every 4 weeks  Return to clinic in 4 weeks for next treatment and follow-up

## 2018-10-12 NOTE — Telephone Encounter (Signed)
Oral Oncology Patient Advocate Encounter  [facility] emailed me to inform me that the PAF grant was only covering a small portion of this months copay making her cost $540.78.  I called the patient and this is not affordable for her. There are no grant funds open at this time and she has only a few days of medication left. We will use a voucher today for a one time use at [facility] so that she can get her medicine today. That information is as follows and has been shared with [facility].  BIN: [id number] PCN: [id number] Group: [id number] ID: [id number]  I also spoke to the patient about signing up for manufacturer assistance since there are no grant funds available. The patient agreed to this and has an appointment today with Dr. Gudena. I met her in the lobby and had her sign the application and Dr. Gudena signed his portion. Mr. Cortner is bringing me income documents to send with the application today. I will keep an eye out for a grant to open in the meantime.   Mr and Mrs. Halteman verbalized understanding and great appreciation.  This encounter will be updated until final determination  Krista Layton CPHT Specialty Pharmacy Patient Advocate  Cancer Center Phone [phone] Fax [phone]   

## 2018-10-15 ENCOUNTER — Telehealth: Payer: Self-pay | Admitting: Hematology and Oncology

## 2018-10-15 NOTE — Telephone Encounter (Signed)
Per 12/27 no los 

## 2018-10-16 NOTE — Telephone Encounter (Signed)
Oral Oncology Patient Advocate Encounter  I called [employer] to make sure they had everything they needed from me and they needed a small piece of missing information on the application. I filled it out and faxed it back 10/16/18.  Mrs. Lussier did pick up a free month of her Verzenio from [employer] using a 1 time voucher on 10/15/2018.  This encounter will be updated until final determination   [facility] Phone [phone] Fax [phone]

## 2018-10-19 NOTE — Telephone Encounter (Signed)
Oral Oncology Patient Advocate Encounter  [name] Riley has been approved with [employer] 10/17/18-10/17/19. The patient will receive the Verzenio at her home, free of charge.   Verzenio will be filled at [facility] ph# [phone]. The patient has been instructed to call 10 days before her refill is due to schedule her next fill.  I called the patient and gave her this information, she verbalized understanding and great appreciation.  [facility] Phone [phone] Fax [phone]

## 2018-10-26 ENCOUNTER — Other Ambulatory Visit: Payer: Self-pay | Admitting: Hematology and Oncology

## 2018-10-31 ENCOUNTER — Telehealth: Payer: Self-pay | Admitting: Internal Medicine

## 2018-10-31 NOTE — Telephone Encounter (Signed)
ZV PAL - moved appointment from 1/24 to 1/23. Not able to reach patient or leave message. Schedule mailed. Also added comment to 1/17 appointment note to send patient for updated calendar.

## 2018-11-01 ENCOUNTER — Ambulatory Visit (HOSPITAL_COMMUNITY): Payer: Medicare Other

## 2018-11-01 NOTE — Progress Notes (Signed)
Patient Care Team: Bridget Hartshorn, NP as PCP - General (Adult Health Nurse Practitioner)  DIAGNOSIS:    ICD-10-CM   1. Metastasis to bone [facility]) C79.51 CT Abdomen Pelvis W Contrast    CT Chest W Contrast    NM Bone Scan Whole Body  2. Malignant neoplasm of upper-outer quadrant of left breast in female, estrogen receptor positive ([place]) C50.412 CT Abdomen Pelvis W Contrast   Z17.0 CT Chest W Contrast    NM Bone Scan Whole Body    SUMMARY OF ONCOLOGIC HISTORY:   Breast cancer of upper-outer quadrant of left female breast ([place])   11/14/2011 Surgery    Bilateral mastectomy, prophylactic on the right, left breast IDC 3/18 lymph nodes positive with extracapsular extension ER 89%, PR 81%, HER-2 negative, Ki-67 79% T2 N1 A. stage IIB    12/13/2011 - 06/28/2012 Chemotherapy    4 cycles of FEC followed by 4 cycles of Taxotere    07/17/2012 - 08/22/2012 Radiation Therapy    Adjuvant radiation therapy    08/22/2012 - 03/16/2016 Anti-estrogen oral therapy    Arimidex 1 mg daily    03/16/2016 Relapse/Recurrence    Subcutaneous nodule excision left chest: Infiltrating carcinoma breast primary, ER positive, PR negative    03/29/2016 Imaging    CT CAP and bone scan: Lytic lesions T8 vertebral, T1 posterior element, subcutaneous nodule left lateral chest wall, nonspecific lung nodules; Bone scan: Mets to kull, left humerus, left eighth rib, T7/T8, sternum, left acetabulum    04/28/2016 - 06/17/2017 Chemotherapy    Herceptin, lapatinib, Faslodex, Zometa every 4 weeks, lapatinib discontinued in September 2018 due to elevation of LFTs    06/22/2017 Relapse/Recurrence    Surgical excision:Soft tissue mass left lateral chest wall primary breast cancer, soft tissue mass left medial chest wall breast cancer, tumor is within the dermis extending to the subcutaneous adipose tissue and involves portions of skeletal muscle    08/2017 - 02/16/2018 Chemotherapy    Faslodex with Herceptin and Perjeta along with  Zometa every 4 weeks     03/02/2018 - 05/25/2018 Chemotherapy    Kadcyla     05/11/2018 Imaging    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 Associated vasogenic edema within the underlying right cerebral hemisphere without significant midline shift. Signal abnormality throughout the visualized bone marrow, compatible with osseous metastatic disease.       06/04/2018 Imaging    CT CAP: Right lower lobe lung nodule 7 mm (was 5 mm); multiple bone metastases throughout the spine and ribs sternum scapula and humerus, slightly increased lower thoracic mets, right renal lesion 2.5 cm (was 1.2 cm) right femur met increased from 2.1 cm to 2.9 cm    06/15/2018 -  Anti-estrogen oral therapy    Abemaciclib, Herceptin, letrozole, Xgeva     Metastatic breast cancer ([place])   06/29/2017 Initial Diagnosis    Metastatic breast cancer ([place])    06/15/2018 -  Chemotherapy    The patient had trastuzumab (HERCEPTIN) 546 mg in sodium chloride 0.9 % 250 mL chemo infusion, 8 mg/kg = 546 mg, Intravenous,  Once, 5 of 12 cycles Administration: 546 mg (06/15/2018), 399 mg (09/07/2018), 399 mg (07/13/2018), 399 mg (10/12/2018), 399 mg (08/10/2018)  for chemotherapy treatment.      CHIEF COMPLIANT:  Follow-up on Herceptin with abemaciclib and letrozole and Xgeva  INTERVAL HISTORY: [name] Riley is a 68 y.o. with above-mentioned history of metastatic breast cancer currently on abemaciclib with letrozole along with Herceptin Xgeva. She presents  to the clinic alone today and notes fatigue after her last treatment for a few days. She denies headaches. She reports she had to take a few nausea pills after her last treatment. She reports diarrhea for which she takes Imodium. She reports splitting fingernails and redness under her fingernails. Previously noticed red bumps and red rash on her arms has persisted. She reports dryness and blood in her nose. She reports muscle aches and pains in her legs that  improves with walking. Her labs from today show Hg 10.2, WBC 3.1, neutrophils 1.1, and platelets 74. Her next scans will be in 11/2018.   REVIEW OF SYSTEMS:   Constitutional: Denies fevers, chills or abnormal weight loss (+) fatigue (+) splitting fingernails with redness Eyes: Denies blurriness of vision Ears, nose, mouth, throat, and face: Denies mucositis or sore throat (+) dryness and blood in nose Respiratory: Denies cough, dyspnea or wheezes Cardiovascular: Denies palpitation, chest discomfort Gastrointestinal:  Denies heartburn (+) diarrhea (+) nausea Skin: (+) redness on arms MSK: (+) aches and pain in legs Lymphatics: Denies new lymphadenopathy or easy bruising Neurological: Denies numbness, tingling or new weaknesses Behavioral/Psych: Mood is stable, no new changes  Extremities: No lower extremity edema Breast: denies any pain or lumps or nodules in either breasts All other systems were reviewed with the patient and are negative.  I have reviewed the past medical history, past surgical history, social history and family history with the patient and they are unchanged from previous note.  ALLERGIES:  is allergic to cantaloupe extract allergy skin test; contrast media [iodinated diagnostic agents]; pravastatin; and zosyn [piperacillin sod-tazobactam so].  MEDICATIONS:  Current Outpatient Medications  Medication Sig Dispense Refill  . acetaminophen (TYLENOL) 500 MG tablet Take 1,000 mg by mouth every 6 (six) hours as needed for moderate pain.    Marland Kitchen aspirin 81 MG tablet Take 81 mg by mouth daily.      . cholecalciferol (VITAMIN D) 1000 units tablet Take 1 tablet (1,000 Units total) by mouth daily.    . diphenhydrAMINE (BENADRYL) 25 MG tablet Take 50 mg by mouth at bedtime as needed for sleep.    Marland Kitchen letrozole (FEMARA) 2.5 MG tablet Take 1 tablet (2.5 mg total) by mouth daily. 90 tablet 3  . levETIRAcetam (KEPPRA) 500 MG tablet TAKE ONE TABLET BY MOUTH TWICE DAILY 60 tablet 2  .  levothyroxine (SYNTHROID, LEVOTHROID) 100 MCG tablet Take 100 mcg by mouth daily before breakfast. BRAND ONLY    . lidocaine-prilocaine (EMLA) cream Apply topically as needed (for port access). Apply to affected area once 30 g 3  . metoprolol succinate (TOPROL-XL) 100 MG 24 hr tablet Take 1 tablet (100 mg total) by mouth daily. 90 tablet 3  . Multiple Vitamins-Minerals (MULTIVITAMIN WITH MINERALS) tablet Take 1 tablet by mouth daily.      . ondansetron (ZOFRAN-ODT) 4 MG disintegrating tablet Take 1 tablet (4 mg total) by mouth every 6 (six) hours as needed for nausea or vomiting. 30 tablet 3  . predniSONE (DELTASONE) 50 MG tablet Take 1 16m tablet by mouth 13 hrs, 7hrs, and 1 hr prior to CT Scan. (CT dye allergy protocol) 3 tablet 0  . VERZENIO 150 MG tablet TAKE 1 TABLET (150 MG TOTAL) BY MOUTH 2 (TWO) TIMES DAILY. SWALLOW TABLETS WHOLE. DO NOT CHEW, CRUSH, OR SPLIT TABLETS BEFORE SWALLOWING. 56 tablet 1   No current facility-administered medications for this visit.    Facility-Administered Medications Ordered in Other Visits  Medication Dose Route Frequency Provider Last  Rate Last Dose  . 0.9 %  sodium chloride infusion   Intravenous Continuous Donnie Mesa, MD      . acetaminophen (TYLENOL) tablet 975 mg  975 mg Oral Q6H Donnie Mesa, MD      . enoxaparin (LOVENOX) injection 40 mg  40 mg Subcutaneous Q24H Donnie Mesa, MD      . morphine 4 MG/ML injection 2-4 mg  2-4 mg Intravenous Q2H PRN Donnie Mesa, MD      . ondansetron (ZOFRAN-ODT) disintegrating tablet 4 mg  4 mg Oral Q6H PRN Donnie Mesa, MD       Or  . ondansetron (ZOFRAN) 4 mg in sodium chloride 0.9 % 50 mL IVPB  4 mg Intravenous Q6H PRN Donnie Mesa, MD      . oxyCODONE (Oxy IR/ROXICODONE) immediate release tablet 5-10 mg  5-10 mg Oral Q4H PRN Donnie Mesa, MD      . piperacillin-tazobactam (ZOSYN) 3.375 g in dextrose 5 % 50 mL IVPB  3.375 g Intravenous Q8H Donnie Mesa, MD      . prochlorperazine (COMPAZINE)  tablet 10 mg  10 mg Oral Q6H PRN Donnie Mesa, MD       Or  . prochlorperazine (COMPAZINE) injection 5-10 mg  5-10 mg Intravenous Q6H PRN Donnie Mesa, MD        PHYSICAL EXAMINATION: ECOG PERFORMANCE STATUS: 1 - Symptomatic but completely ambulatory  Vitals:   11/02/18 0927  BP: (!) 167/80  Pulse: 81  Resp: 16  Temp: 98 F (36.7 C)  SpO2: 100%   Filed Weights   11/02/18 0927  Weight: 140 lb 9.6 oz (63.8 kg)    GENERAL: alert, no distress and comfortable SKIN: skin color, texture, turgor are normal, no rashes or significant lesions EYES: normal, Conjunctiva are pink and non-injected, sclera clear OROPHARYNX: no exudate, no erythema and lips, buccal mucosa, and tongue normal  NECK: supple, thyroid normal size, non-tender, without nodularity LYMPH: no palpable lymphadenopathy in the cervical, axillary or inguinal LUNGS: clear to auscultation and percussion with normal breathing effort HEART: regular rate & rhythm and no murmurs and no lower extremity edema ABDOMEN: abdomen soft, non-tender and normal bowel sounds MUSCULOSKELETAL: no cyanosis of digits and no clubbing  NEURO: alert & oriented x 3 with fluent speech, no focal motor/sensory deficits EXTREMITIES: No lower extremity edema  LABORATORY DATA:  I have reviewed the data as listed CMP Latest Ref Rng & Units 11/02/2018 10/12/2018 09/07/2018  Glucose 70 - 99 mg/dL 103(H) 153(H) 97  BUN 8 - 23 mg/dL 12 12 17   Creatinine 0.44 - 1.00 mg/dL 0.92 0.89 0.89  Sodium 135 - 145 mmol/L 140 141 138  Potassium 3.5 - 5.1 mmol/L 3.6 3.4(L) 3.2(L)  Chloride 98 - 111 mmol/L 103 103 102  CO2 22 - 32 mmol/L 28 29 27   Calcium 8.9 - 10.3 mg/dL 9.7 9.3 10.1  Total Protein 6.5 - 8.1 g/dL 7.0 7.0 7.2  Total Bilirubin 0.3 - 1.2 mg/dL 0.6 0.4 0.5  Alkaline Phos 38 - 126 U/L 111 113 117  AST 15 - 41 U/L 32 36 35  ALT 0 - 44 U/L 24 27 24     Lab Results  Component Value Date   WBC 3.1 (L) 11/02/2018   HGB 10.2 (L) 11/02/2018   HCT  29.7 (L) 11/02/2018   MCV 100.0 11/02/2018   PLT 74 (L) 11/02/2018   NEUTROABS 1.1 (L) 11/02/2018    ASSESSMENT & PLAN:  Breast cancer of upper-outer quadrant of left female breast ([place])  Left breast invasive ductal carcinomaT2, N1, 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 Was on Arimidex since 08/22/2012 to 03/30/16 Recent treatment: Kadcyla Subcutaneous nodule excisionleft chest: Infiltrating carcinoma breast primary, ER positive, PR negative, HER-2 positive  06/04/2018:CTCAP andBone scan: Progression of metastatic disease involving the bone, renal lesion and lung nodule  Current treatment: Abemaciclibwith letrozole along with Herceptin(to be given every 4 weeks)and Xgeva  Brain MRI showing dural based metastases: Because the patient is asymptomatic we decided to not perform any procedures at this time.Repeat brain MRI done at this week showed marked improvement in the dural metastases.  Abemaciclib toxicities 1.Diarrhea alternating with constipation: Markedly improved 2.Thrombocytopenia:Platelet count is  74 3.Anemia due to therapy: Hemoglobin is10.2and we are watching and monitoring it. 4.  ANC 1.1 I discussed with the patient that next month if her blood counts continue to decline then we will have to hold very Zenia for a week and then reduce the dosage.  CT chest abdomen pelvis: 09/06/2018 interval resolution of the right renal lesions, no substantial change in the 6 mm right lower lobe lung nodule, stable bone metastases There are areas of segmental edema concerning for pyelonephritis or embolic disease Scans will be done in March 2020.  Treatment plan: Continue with current treatment with abemaciclib and letrozole along with Herceptin every 4 weeks and Xgeva being given once every 3 months.    Orders Placed  This Encounter  Procedures  . CT Abdomen Pelvis W Contrast    Standing Status:   Future    Standing Expiration Date:   11/02/2019    Order Specific Question:   ** REASON FOR EXAM (FREE TEXT)    Answer:   Met Breast cancer restaging    Order Specific Question:   If indicated for the ordered procedure, I authorize the administration of contrast media per Radiology protocol    Answer:   Yes    Order Specific Question:   Preferred imaging location?    Answer:   [facility]    Order Specific Question:   Is Oral Contrast requested for this exam?    Answer:   Yes, Per Radiology protocol    Order Specific Question:   Radiology Contrast Protocol - do NOT remove file path    Answer:   \\[email or web address].[email or web address]  . CT Chest W Contrast    Standing Status:   Future    Standing Expiration Date:   11/02/2019    Order Specific Question:   ** REASON FOR EXAM (FREE TEXT)    Answer:   Met breast ca restaging    Order Specific Question:   If indicated for the ordered procedure, I authorize the administration of contrast media per Radiology protocol    Answer:   Yes    Order Specific Question:   Preferred imaging location?    Answer:   [facility]    Order Specific Question:   Radiology Contrast Protocol - do NOT remove file path    Answer:   \\[email or web address]  . NM Bone Scan Whole Body    Standing Status:   Future    Standing Expiration Date:   11/02/2019    Order Specific Question:   ** REASON FOR EXAM (FREE TEXT)    Answer:   Met Br cancer restaging    Order Specific Question:   If indicated  for the ordered procedure, I authorize the administration of a radiopharmaceutical per Radiology protocol    Answer:   Yes    Order Specific Question:   Preferred imaging location?    Answer:   [facility]    Order Specific Question:   Radiology Contrast Protocol - do NOT remove file path    Answer:    \\[email or web address]   The patient has a good understanding of the overall plan. she agrees with it. she will call with any problems that may develop before the next visit here.  Nicholas Lose, MD 11/02/2018  Julious Oka Dorshimer am acting as scribe for Dr. Nicholas Lose.  I have reviewed the above documentation for accuracy and completeness, and I agree with the above.

## 2018-11-02 ENCOUNTER — Ambulatory Visit (HOSPITAL_COMMUNITY): Payer: Medicare Other

## 2018-11-02 ENCOUNTER — Inpatient Hospital Stay: Payer: Medicare Other | Attending: Hematology and Oncology

## 2018-11-02 ENCOUNTER — Telehealth: Payer: Self-pay | Admitting: Hematology and Oncology

## 2018-11-02 ENCOUNTER — Inpatient Hospital Stay: Payer: Medicare Other

## 2018-11-02 ENCOUNTER — Inpatient Hospital Stay (HOSPITAL_BASED_OUTPATIENT_CLINIC_OR_DEPARTMENT_OTHER): Payer: Medicare Other | Admitting: Hematology and Oncology

## 2018-11-02 VITALS — BP 167/80 | HR 81 | Temp 98.0°F | Resp 16 | Ht 68.0 in | Wt 140.6 lb

## 2018-11-02 DIAGNOSIS — D696 Thrombocytopenia, unspecified: Secondary | ICD-10-CM | POA: Insufficient documentation

## 2018-11-02 DIAGNOSIS — Z923 Personal history of irradiation: Secondary | ICD-10-CM | POA: Insufficient documentation

## 2018-11-02 DIAGNOSIS — R21 Rash and other nonspecific skin eruption: Secondary | ICD-10-CM | POA: Insufficient documentation

## 2018-11-02 DIAGNOSIS — Z79810 Long term (current) use of selective estrogen receptor modulators (SERMs): Secondary | ICD-10-CM | POA: Diagnosis not present

## 2018-11-02 DIAGNOSIS — Z95828 Presence of other vascular implants and grafts: Secondary | ICD-10-CM

## 2018-11-02 DIAGNOSIS — R197 Diarrhea, unspecified: Secondary | ICD-10-CM

## 2018-11-02 DIAGNOSIS — K5900 Constipation, unspecified: Secondary | ICD-10-CM | POA: Insufficient documentation

## 2018-11-02 DIAGNOSIS — Z9221 Personal history of antineoplastic chemotherapy: Secondary | ICD-10-CM

## 2018-11-02 DIAGNOSIS — Z170 Estrogen receptor positive status [ER+]: Secondary | ICD-10-CM

## 2018-11-02 DIAGNOSIS — Z7982 Long term (current) use of aspirin: Secondary | ICD-10-CM | POA: Insufficient documentation

## 2018-11-02 DIAGNOSIS — C50412 Malignant neoplasm of upper-outer quadrant of left female breast: Secondary | ICD-10-CM | POA: Insufficient documentation

## 2018-11-02 DIAGNOSIS — C7951 Secondary malignant neoplasm of bone: Secondary | ICD-10-CM | POA: Insufficient documentation

## 2018-11-02 DIAGNOSIS — G40909 Epilepsy, unspecified, not intractable, without status epilepticus: Secondary | ICD-10-CM | POA: Diagnosis not present

## 2018-11-02 DIAGNOSIS — Z79899 Other long term (current) drug therapy: Secondary | ICD-10-CM | POA: Insufficient documentation

## 2018-11-02 DIAGNOSIS — Z5112 Encounter for antineoplastic immunotherapy: Secondary | ICD-10-CM | POA: Insufficient documentation

## 2018-11-02 DIAGNOSIS — C50919 Malignant neoplasm of unspecified site of unspecified female breast: Secondary | ICD-10-CM

## 2018-11-02 LAB — CBC WITH DIFFERENTIAL (CANCER CENTER ONLY)
Abs Immature Granulocytes: 0.01 10*3/uL (ref 0.00–0.07)
Basophils Absolute: 0.1 10*3/uL (ref 0.0–0.1)
Basophils Relative: 2 %
Eosinophils Absolute: 0.1 10*3/uL (ref 0.0–0.5)
Eosinophils Relative: 2 %
HCT: 29.7 % — ABNORMAL LOW (ref 36.0–46.0)
Hemoglobin: 10.2 g/dL — ABNORMAL LOW (ref 12.0–15.0)
Immature Granulocytes: 0 %
Lymphocytes Relative: 52 %
Lymphs Abs: 1.6 10*3/uL (ref 0.7–4.0)
MCH: 34.3 pg — ABNORMAL HIGH (ref 26.0–34.0)
MCHC: 34.3 g/dL (ref 30.0–36.0)
MCV: 100 fL (ref 80.0–100.0)
Monocytes Absolute: 0.2 10*3/uL (ref 0.1–1.0)
Monocytes Relative: 7 %
Neutro Abs: 1.1 10*3/uL — ABNORMAL LOW (ref 1.7–7.7)
Neutrophils Relative %: 37 %
Platelet Count: 74 10*3/uL — ABNORMAL LOW (ref 150–400)
RBC: 2.97 MIL/uL — ABNORMAL LOW (ref 3.87–5.11)
RDW: 12.5 % (ref 11.5–15.5)
WBC Count: 3.1 10*3/uL — ABNORMAL LOW (ref 4.0–10.5)
nRBC: 0 % (ref 0.0–0.2)

## 2018-11-02 LAB — CMP (CANCER CENTER ONLY)
ALT: 24 U/L (ref 0–44)
AST: 32 U/L (ref 15–41)
Albumin: 3.9 g/dL (ref 3.5–5.0)
Alkaline Phosphatase: 111 U/L (ref 38–126)
Anion gap: 9 (ref 5–15)
BUN: 12 mg/dL (ref 8–23)
CO2: 28 mmol/L (ref 22–32)
Calcium: 9.7 mg/dL (ref 8.9–10.3)
Chloride: 103 mmol/L (ref 98–111)
Creatinine: 0.92 mg/dL (ref 0.44–1.00)
GFR, Est AFR Am: >60 mL/min (ref >60)
GFR, Estimated: >60 mL/min (ref >60)
Glucose, Bld: 103 mg/dL — ABNORMAL HIGH (ref 70–99)
Potassium: 3.6 mmol/L (ref 3.5–5.1)
Sodium: 140 mmol/L (ref 135–145)
Total Bilirubin: 0.6 mg/dL (ref 0.3–1.2)
Total Protein: 7 g/dL (ref 6.5–8.1)

## 2018-11-02 MED ORDER — DENOSUMAB 120 MG/1.7ML ~~LOC~~ SOLN
120.0000 mg | Freq: Once | SUBCUTANEOUS | Status: AC
  Administered 2018-11-02: 120 mg via SUBCUTANEOUS

## 2018-11-02 MED ORDER — ACETAMINOPHEN 325 MG PO TABS
ORAL_TABLET | ORAL | Status: AC
  Filled 2018-11-02: qty 2

## 2018-11-02 MED ORDER — HEPARIN SOD (PORK) LOCK FLUSH 100 UNIT/ML IV SOLN
500.0000 [IU] | Freq: Once | INTRAVENOUS | Status: AC | PRN
  Administered 2018-11-02: 500 [IU]
  Filled 2018-11-02: qty 5

## 2018-11-02 MED ORDER — TRASTUZUMAB CHEMO 150 MG IV SOLR
6.0000 mg/kg | Freq: Once | INTRAVENOUS | Status: AC
  Administered 2018-11-02: 399 mg via INTRAVENOUS
  Filled 2018-11-02: qty 19

## 2018-11-02 MED ORDER — DIPHENHYDRAMINE HCL 25 MG PO CAPS
ORAL_CAPSULE | ORAL | Status: AC
  Filled 2018-11-02: qty 2

## 2018-11-02 MED ORDER — SODIUM CHLORIDE 0.9 % IV SOLN
Freq: Once | INTRAVENOUS | Status: AC
  Administered 2018-11-02: 11:00:00 via INTRAVENOUS
  Filled 2018-11-02: qty 250

## 2018-11-02 MED ORDER — METOPROLOL SUCCINATE ER 100 MG PO TB24: 100.0000 mg | ORAL_TABLET | Freq: Every day | ORAL | 3 refills | Status: DC

## 2018-11-02 MED ORDER — ACETAMINOPHEN 325 MG PO TABS
650.0000 mg | ORAL_TABLET | Freq: Once | ORAL | Status: AC
  Administered 2018-11-02: 650 mg via ORAL

## 2018-11-02 MED ORDER — DENOSUMAB 120 MG/1.7ML ~~LOC~~ SOLN
SUBCUTANEOUS | Status: AC
  Filled 2018-11-02: qty 1.7

## 2018-11-02 MED ORDER — SODIUM CHLORIDE 0.9% FLUSH
10.0000 mL | INTRAVENOUS | Status: DC | PRN
  Administered 2018-11-02: 10 mL
  Filled 2018-11-02: qty 10

## 2018-11-02 MED ORDER — DIPHENHYDRAMINE HCL 25 MG PO CAPS
50.0000 mg | ORAL_CAPSULE | Freq: Once | ORAL | Status: AC
  Administered 2018-11-02: 50 mg via ORAL

## 2018-11-02 NOTE — Patient Instructions (Signed)
[facility] Discharge Instructions for Patients Receiving Chemotherapy  Today you received the following chemotherapy agents Herceptin  To help prevent nausea and vomiting after your treatment, we encourage you to take your nausea medication as directed   If you develop nausea and vomiting that is not controlled by your nausea medication, call the clinic.   BELOW ARE SYMPTOMS THAT SHOULD BE REPORTED IMMEDIATELY:  *FEVER GREATER THAN 100.5 F  *CHILLS WITH OR WITHOUT FEVER  NAUSEA AND VOMITING THAT IS NOT CONTROLLED WITH YOUR NAUSEA MEDICATION  *UNUSUAL SHORTNESS OF BREATH  *UNUSUAL BRUISING OR BLEEDING  TENDERNESS IN MOUTH AND THROAT WITH OR WITHOUT PRESENCE OF ULCERS  *URINARY PROBLEMS  *BOWEL PROBLEMS  UNUSUAL RASH Items with * indicate a potential emergency and should be followed up as soon as possible.  Feel free to call the clinic should you have any questions or concerns. The clinic phone number is [phone].  Please show the CHEMO ALERT CARD at check-in to the Emergency Department and triage nurse.   

## 2018-11-02 NOTE — Assessment & Plan Note (Signed)
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 Was on Arimidex since 08/22/2012 to 03/30/16 Recent treatment: Kadcyla Subcutaneous nodule excisionleft chest: Infiltrating carcinoma breast primary, ER positive, PR negative, HER-2 positive  06/04/2018:CTCAP andBone scan: Progression of metastatic disease involving the bone, renal lesion and lung nodule  Current treatment: Abemaciclibwith letrozole along with Herceptin(to be given every 4 weeks)and Xgeva  Brain MRI showing dural based metastases: Because the patient is asymptomatic we decided to not perform any procedures at this time.Repeat brain MRI done at this week showed marked improvement in the dural metastases.  Abemaciclib toxicities 1.Diarrhea alternating with constipation: Markedly improved 2.Thrombocytopenia:Platelet count is 100 3.Anemia due to therapy: Hemoglobin is10.4and we are watching and monitoring it.  CT chest abdomen pelvis: 09/06/2018 interval resolution of the right renal lesions, no substantial change in the 6 mm right lower lobe lung nodule, stable bone metastases There are areas of segmental edema concerning for pyelonephritis or embolic disease  Treatment plan: Continue with current treatment with abemaciclib and letrozole along with Herceptin and Xgeva being given once every 4 weeks

## 2018-11-02 NOTE — Telephone Encounter (Signed)
No los °

## 2018-11-06 ENCOUNTER — Other Ambulatory Visit: Payer: Self-pay | Admitting: Radiation Therapy

## 2018-11-07 ENCOUNTER — Ambulatory Visit (HOSPITAL_COMMUNITY)
Admission: RE | Admit: 2018-11-07 | Discharge: 2018-11-07 | Disposition: A | Discharge status: Home / Self Care | Payer: Medicare Other | Source: Ambulatory Visit | Attending: Internal Medicine | Admitting: Internal Medicine

## 2018-11-07 DIAGNOSIS — Z5112 Encounter for antineoplastic immunotherapy: Secondary | ICD-10-CM | POA: Diagnosis not present

## 2018-11-07 DIAGNOSIS — C7931 Secondary malignant neoplasm of brain: Secondary | ICD-10-CM | POA: Insufficient documentation

## 2018-11-07 MED ORDER — HEPARIN SOD (PORK) LOCK FLUSH 100 UNIT/ML IV SOLN
500.0000 [IU] | INTRAVENOUS | Status: AC | PRN
  Administered 2018-11-07: 500 [IU]

## 2018-11-07 MED ORDER — GADOBUTROL 1 MMOL/ML IV SOLN
6.0000 mL | Freq: Once | INTRAVENOUS | Status: AC | PRN
  Administered 2018-11-07: 6 mL via INTRAVENOUS

## 2018-11-08 ENCOUNTER — Inpatient Hospital Stay (HOSPITAL_BASED_OUTPATIENT_CLINIC_OR_DEPARTMENT_OTHER): Payer: Medicare Other | Admitting: Internal Medicine

## 2018-11-08 ENCOUNTER — Telehealth: Payer: Self-pay | Admitting: Internal Medicine

## 2018-11-08 ENCOUNTER — Encounter: Payer: Self-pay | Admitting: Internal Medicine

## 2018-11-08 VITALS — BP 166/86 | HR 78 | Temp 98.7°F | Resp 18 | Ht 68.0 in | Wt 139.7 lb

## 2018-11-08 DIAGNOSIS — D696 Thrombocytopenia, unspecified: Secondary | ICD-10-CM

## 2018-11-08 DIAGNOSIS — C50412 Malignant neoplasm of upper-outer quadrant of left female breast: Secondary | ICD-10-CM | POA: Diagnosis not present

## 2018-11-08 DIAGNOSIS — Z7982 Long term (current) use of aspirin: Secondary | ICD-10-CM

## 2018-11-08 DIAGNOSIS — C7931 Secondary malignant neoplasm of brain: Secondary | ICD-10-CM

## 2018-11-08 DIAGNOSIS — K5900 Constipation, unspecified: Secondary | ICD-10-CM

## 2018-11-08 DIAGNOSIS — C7951 Secondary malignant neoplasm of bone: Secondary | ICD-10-CM

## 2018-11-08 DIAGNOSIS — Z5112 Encounter for antineoplastic immunotherapy: Secondary | ICD-10-CM | POA: Diagnosis not present

## 2018-11-08 DIAGNOSIS — R197 Diarrhea, unspecified: Secondary | ICD-10-CM

## 2018-11-08 DIAGNOSIS — Z170 Estrogen receptor positive status [ER+]: Secondary | ICD-10-CM | POA: Diagnosis not present

## 2018-11-08 DIAGNOSIS — Z79810 Long term (current) use of selective estrogen receptor modulators (SERMs): Secondary | ICD-10-CM

## 2018-11-08 DIAGNOSIS — G40909 Epilepsy, unspecified, not intractable, without status epilepticus: Secondary | ICD-10-CM

## 2018-11-08 DIAGNOSIS — R21 Rash and other nonspecific skin eruption: Secondary | ICD-10-CM

## 2018-11-08 DIAGNOSIS — Z79899 Other long term (current) drug therapy: Secondary | ICD-10-CM

## 2018-11-08 DIAGNOSIS — Z9221 Personal history of antineoplastic chemotherapy: Secondary | ICD-10-CM

## 2018-11-08 DIAGNOSIS — Z923 Personal history of irradiation: Secondary | ICD-10-CM

## 2018-11-08 NOTE — Progress Notes (Signed)
[name] at [place], [place] [address] [phone]   Interval Evaluation  Date of Service: 11/08/18 Patient Name: [name] Riley Patient MRN: [id number] Patient DOB: [birth year] Provider: Ventura Sellers, MD  Identifying Statement:  [name] Riley is a 68 y.o. female with seizures, weakness   Primary Cancer:  Oncologic History:   Breast cancer of upper-outer quadrant of left female breast ([place])   11/14/2011 Surgery    Bilateral mastectomy, prophylactic on the right, left breast IDC 3/18 lymph nodes positive with extracapsular extension ER 89%, PR 81%, HER-2 negative, Ki-67 79% T2 N1 A. stage IIB    12/13/2011 - 06/28/2012 Chemotherapy    4 cycles of FEC followed by 4 cycles of Taxotere    07/17/2012 - 08/22/2012 Radiation Therapy    Adjuvant radiation therapy    08/22/2012 - 03/16/2016 Anti-estrogen oral therapy    Arimidex 1 mg daily    03/16/2016 Relapse/Recurrence    Subcutaneous nodule excision left chest: Infiltrating carcinoma breast primary, ER positive, PR negative    03/29/2016 Imaging    CT CAP and bone scan: Lytic lesions T8 vertebral, T1 posterior element, subcutaneous nodule left lateral chest wall, nonspecific lung nodules; Bone scan: Mets to kull, left humerus, left eighth rib, T7/T8, sternum, left acetabulum    04/28/2016 - 06/17/2017 Chemotherapy    Herceptin, lapatinib, Faslodex, Zometa every 4 weeks, lapatinib discontinued in September 2018 due to elevation of LFTs    06/22/2017 Relapse/Recurrence    Surgical excision:Soft tissue mass left lateral chest wall primary breast cancer, soft tissue mass left medial chest wall breast cancer, tumor is within the dermis extending to the subcutaneous adipose tissue and involves portions of skeletal muscle    08/2017 - 02/16/2018 Chemotherapy    Faslodex with Herceptin and Perjeta along with Zometa every 4 weeks     03/02/2018 - 05/25/2018 Chemotherapy    Kadcyla     05/11/2018 Imaging    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 Associated vasogenic edema within the underlying right cerebral hemisphere without significant midline shift. Signal abnormality throughout the visualized bone marrow, compatible with osseous metastatic disease.       06/04/2018 Imaging    CT CAP: Right lower lobe lung nodule 7 mm (was 5 mm); multiple bone metastases throughout the spine and ribs sternum scapula and humerus, slightly increased lower thoracic mets, right renal lesion 2.5 cm (was 1.2 cm) right femur met increased from 2.1 cm to 2.9 cm    06/15/2018 -  Anti-estrogen oral therapy    Abemaciclib, Herceptin, letrozole, Xgeva     Metastatic breast cancer ([place])   06/29/2017 Initial Diagnosis    Metastatic breast cancer ([place])    06/15/2018 -  Chemotherapy    The patient had trastuzumab (HERCEPTIN) 546 mg in sodium chloride 0.9 % 250 mL chemo infusion, 8 mg/kg = 546 mg, Intravenous,  Once, 6 of 12 cycles Administration: 546 mg (06/15/2018), 399 mg (09/07/2018), 399 mg (07/13/2018), 399 mg (10/12/2018), 399 mg (11/02/2018), 399 mg (08/10/2018)  for chemotherapy treatment.      Interval History:  [name] Riley presents today after recent MRI brain.  She describes persistent left hand dysfunction, which is now mild.  She has not experienced any further seizures.  Denies headaches.  Medications: Current Outpatient Medications on File Prior to Visit  Medication Sig Dispense Refill  . acetaminophen (TYLENOL) 500 MG tablet Take 1,000 mg by mouth every 6 (six) hours as needed  for moderate pain.    Marland Kitchen aspirin 81 MG tablet Take 81 mg by mouth daily.      . cholecalciferol (VITAMIN D) 1000 units tablet Take 1 tablet (1,000 Units total) by mouth daily.    . diphenhydrAMINE (BENADRYL) 25 MG tablet Take 50 mg by mouth at bedtime as needed for sleep.    Marland Kitchen letrozole (FEMARA) 2.5 MG tablet Take 1 tablet (2.5 mg total) by mouth daily. 90 tablet 3  .  levETIRAcetam (KEPPRA) 500 MG tablet TAKE ONE TABLET BY MOUTH TWICE DAILY 60 tablet 2  . levothyroxine (SYNTHROID, LEVOTHROID) 100 MCG tablet Take 100 mcg by mouth daily before breakfast. BRAND ONLY    . lidocaine-prilocaine (EMLA) cream Apply topically as needed (for port access). Apply to affected area once 30 g 3  . metoprolol succinate (TOPROL-XL) 100 MG 24 hr tablet Take 1 tablet (100 mg total) by mouth daily. 90 tablet 3  . Multiple Vitamins-Minerals (MULTIVITAMIN WITH MINERALS) tablet Take 1 tablet by mouth daily.      . ondansetron (ZOFRAN-ODT) 4 MG disintegrating tablet Take 1 tablet (4 mg total) by mouth every 6 (six) hours as needed for nausea or vomiting. 30 tablet 3  . VERZENIO 150 MG tablet TAKE 1 TABLET (150 MG TOTAL) BY MOUTH 2 (TWO) TIMES DAILY. SWALLOW TABLETS WHOLE. DO NOT CHEW, CRUSH, OR SPLIT TABLETS BEFORE SWALLOWING. 56 tablet 1  . predniSONE (DELTASONE) 50 MG tablet Take 1 15m tablet by mouth 13 hrs, 7hrs, and 1 hr prior to CT Scan. (CT dye allergy protocol) (Patient not taking: Reported on 11/08/2018) 3 tablet 0   Current Facility-Administered Medications on File Prior to Visit  Medication Dose Route Frequency Provider Last Rate Last Dose  . 0.9 %  sodium chloride infusion   Intravenous Continuous TDonnie Mesa MD      . acetaminophen (TYLENOL) tablet 975 mg  975 mg Oral Q6H TDonnie Mesa MD      . enoxaparin (LOVENOX) injection 40 mg  40 mg Subcutaneous Q24H TDonnie Mesa MD      . morphine 4 MG/ML injection 2-4 mg  2-4 mg Intravenous Q2H PRN TDonnie Mesa MD      . ondansetron (ZOFRAN-ODT) disintegrating tablet 4 mg  4 mg Oral Q6H PRN TDonnie Mesa MD       Or  . ondansetron (ZOFRAN) 4 mg in sodium chloride 0.9 % 50 mL IVPB  4 mg Intravenous Q6H PRN TDonnie Mesa MD      . oxyCODONE (Oxy IR/ROXICODONE) immediate release tablet 5-10 mg  5-10 mg Oral Q4H PRN TDonnie Mesa MD      . piperacillin-tazobactam (ZOSYN) 3.375 g in dextrose 5 % 50 mL IVPB  3.375 g  Intravenous Q8H TDonnie Mesa MD      . prochlorperazine (COMPAZINE) tablet 10 mg  10 mg Oral Q6H PRN TDonnie Mesa MD       Or  . prochlorperazine (COMPAZINE) injection 5-10 mg  5-10 mg Intravenous Q6H PRN TDonnie Mesa MD        Allergies:  Allergies  Allergen Reactions  . Cantaloupe Extract Allergy Skin Test Shortness Of Breath  . Contrast Media [Iodinated Diagnostic Agents] Shortness Of Breath and Rash  . Pravastatin Other (See Comments)    Legs hurt  . Zosyn [Piperacillin Sod-Tazobactam So] Rash and Other (See Comments)    Temperature increase, facial flushing   Past Medical History:  Past Medical History:  Diagnosis Date  . Allergy   . [place]  L , invasive ductal carcinoma, ER/PR +,HER2 -  . [facility])    left breast cancer  . Coronary artery disease 2001  . Heart attack [facility]) 09/2000   Sep 25, 2000  --no intervention  . History of chemotherapy comp. 08/22/2012   4 cycles of FEC and $ cycles of Taxotere  . Hyperlipidemia   . Hypertension   . Hypothyroidism   . PONV (postoperative nausea and vomiting)    gets sick from anesthesia  . Status post radiation therapy 07/09/12 - 08/22/2012   Left Breast, 60.4 gray   Past Surgical History:  Past Surgical History:  Procedure Laterality Date  . ABDOMINAL HYSTERECTOMY  1998   TAH, oophorectomy  . APPENDECTOMY  1970  . BREAST SURGERY  1998   removal of benign lump in rt breast  . BREAST SURGERY  11/14/11   right simple mastectomy, left mrm  . INCISION AND DRAINAGE OF WOUND Left 07/01/2017   Procedure: IRRIGATION AND DEBRIDEMENT CHEST WALL ABSCESS;  Surgeon: Donnie Mesa, MD;  Location: [facility];  Service: General;  Laterality: Left;  Marland Kitchen MASS EXCISION Left 06/22/2017   Procedure: EXCISION OF CHEST WALL MASSES;  Surgeon: Donnie Mesa, MD;  Location: [place];  Service: General;  Laterality: Left;  Marland Kitchen MASTECTOMY Bilateral    for left breast cancer  . OVARIAN CYST SURGERY Right 1970  . PORT-A-CATH  REMOVAL  08/30/2012   Procedure: REMOVAL PORT-A-CATH;  Surgeon: Imogene Burn. [name], MD;  Location: [place];  Service: General;  Laterality: Right;  port removal  . PORTACATH PLACEMENT  11/14/2011   Procedure: INSERTION PORT-A-CATH;  Surgeon: Imogene Burn. [name], MD;  Location: [place];  Service: General;  Laterality: Right;  . PORTACATH PLACEMENT N/A 04/25/2016   Procedure: INSERTION PORT-A-CATH LEFT CHEST;  Surgeon: Donnie Mesa, MD;  Location: [place];  Service: General;  Laterality: N/A;  . skin tags  05/09/1997   left axillary left neck skin tags  . TONSILLECTOMY  1968   Social History:  Social History   Socioeconomic History  . Marital status: Married    Spouse name: Not on file  . Number of children: 3  . Years of education: Not on file  . Highest education level: Not on file  Occupational History  . Occupation: Works at [employer]  . Financial resource strain: Not on file  . Food insecurity:    Worry: Not on file    Inability: Not on file  . Transportation needs:    Medical: Not on file    Non-medical: Not on file  Tobacco Use  . Smoking status: Never Smoker  . Smokeless tobacco: Never Used  Substance and Sexual Activity  . Alcohol use: No  . Drug use: No  . Sexual activity: Yes    Birth control/protection: Surgical    Comment: menarche 25, Parity age 20, G16, P3, 1 miscarriage,  HRT x 5-10 yrs, Mild Hot Flashes  Lifestyle  . Physical activity:    Days per week: Not on file    Minutes per session: Not on file  . Stress: Not on file  Relationships  . Social connections:    Talks on phone: Not on file    Gets together: Not on file    Attends religious service: Not on file    Active member of club or organization: Not on file    Attends meetings of clubs or organizations: Not on file    Relationship status: Not  on file  . Intimate partner violence:    Fear of current or ex partner: Not on file    Emotionally  abused: Not on file    Physically abused: Not on file    Forced sexual activity: Not on file  Other Topics Concern  . Not on file  Social History Narrative   Lives at home.   Family History:  Family History  Problem Relation Age of Onset  . Hypertension Maternal Grandmother   . Diabetes Maternal Grandmother   . Cancer Father 26       lung cancer and Prostate Cancer  . Hypertension Mother   . Cancer Paternal Aunt        ovarian  . Cancer Cousin        breast, paternal cousin  . Cancer Paternal Uncle        stomach  . Cancer Paternal Grandfather        Esophagus  . Colon cancer Neg Hx     Review of Systems: Constitutional: Denies fevers, chills or abnormal weight loss Eyes: Denies blurriness of vision Ears, nose, mouth, throat, and face: Denies mucositis or sore throat Respiratory: Denies cough, dyspnea or wheezes Cardiovascular: Denies palpitation, chest discomfort or lower extremity swelling Gastrointestinal:  Denies nausea, constipation, diarrhea GU: Denies dysuria or incontinence Skin: Denies abnormal skin rashes Neurological: Per HPI Musculoskeletal: Denies joint pain, back or neck discomfort. No decrease in ROM Behavioral/Psych: Denies anxiety, disturbance in thought content, and mood instability   Physical Exam: Vitals:   11/08/18 1110  BP: (!) 166/86  Pulse: 78  Resp: 18  Temp: 98.7 F (37.1 C)  SpO2: 99%   KPS: 90. General: Alert, cooperative, pleasant, in no acute distress Head: Normal EENT: No conjunctival injection or scleral icterus. Oral mucosa moist Lungs: Resp effort normal Cardiac: Regular rate and rhythm Abdomen: Soft, non-distended abdomen Skin: No rashes cyanosis or petechiae. Extremities: No clubbing or edema  Neurologic Exam: Mental Status: Awake, alert, attentive to examiner. Oriented to self and environment. Language is fluent with intact comprehension.  Cranial Nerves: Visual acuity is grossly normal. Visual fields are full.  Extra-ocular movements intact. No ptosis. Face is symmetric, tongue midline. Motor: Tone and bulk are normal. Slight incoordination with fine motor function in left hand. Reflexes are symmetric, no pathologic reflexes present. Intact finger to nose bilaterally Sensory: Normal Gait: Normal and tandem gait is normal.   Labs: I have reviewed the data as listed    Component Value Date/Time   NA 140 11/02/2018 0913   NA 140 09/22/2017 0958   K 3.6 11/02/2018 0913   K 3.7 09/22/2017 0958   CL 103 11/02/2018 0913   CL 105 08/10/2012 0908   CO2 28 11/02/2018 0913   CO2 26 09/22/2017 0958   GLUCOSE 103 (H) 11/02/2018 0913   GLUCOSE 80 09/22/2017 0958   GLUCOSE 81 08/10/2012 0908   BUN 12 11/02/2018 0913   BUN 12.8 09/22/2017 0958   CREATININE 0.92 11/02/2018 0913   CREATININE 0.6 09/22/2017 0958   CALCIUM 9.7 11/02/2018 0913   CALCIUM 9.1 09/22/2017 0958   PROT 7.0 11/02/2018 0913   PROT 7.0 09/22/2017 0958   ALBUMIN 3.9 11/02/2018 0913   ALBUMIN 4.0 09/22/2017 0958   AST 32 11/02/2018 0913   AST 37 (H) 09/22/2017 0958   ALT 24 11/02/2018 0913   ALT 31 09/22/2017 0958   ALKPHOS 111 11/02/2018 0913   ALKPHOS 140 09/22/2017 0958   BILITOT 0.6 11/02/2018 0913   BILITOT  0.41 09/22/2017 0958   GFRNONAA >60 11/02/2018 0913   GFRAA >60 11/02/2018 0913   Lab Results  Component Value Date   WBC 3.1 (L) 11/02/2018   NEUTROABS 1.1 (L) 11/02/2018   HGB 10.2 (L) 11/02/2018   HCT 29.7 (L) 11/02/2018   MCV 100.0 11/02/2018   PLT 74 (L) 11/02/2018    Imaging:  Imaging:  [facility] Clinician Interpretation: I have personally reviewed the CNS images as listed.  My interpretation, in the context of the patient's clinical presentation, is stable disease  Mr Jeri Cos Wo Contrast  Result Date: 11/08/2018 CLINICAL DATA:  Follow-up brain metastases EXAM: MRI HEAD WITHOUT AND WITH CONTRAST TECHNIQUE: Multiplanar, multiecho pulse sequences of the brain and surrounding structures were obtained  without and with intravenous contrast. CONTRAST:  6 cc Gadavist intravenous COMPARISON:  08/06/2018 FINDINGS: Brain: Plaque-like dural thickening along the right frontal parietal convexity with areas of superimposed nodular thickening measuring up to 7 mm in thickness, not convincingly changed from prior. When accounting for artifact there is no evident brain metastasis. Apparent linear enhancement over the right pons on axial postcontrast imaging is not confirmed on the other sequences and considered artifactual. Stable or slightly regressed edema or gliosis in the high right frontal parietal region where there is contact between the brain and dural metastasis. No infarct, acute hemorrhage, hydrocephalus, or shift Vascular: Major flow voids and vascular enhancements are preserved Skull and upper cervical spine: Extensive sclerotic metastatic disease in the right more than left calvarium, in the cervical spine, lower clivus, and right posterior mandible. Sinuses/Orbits: Negative IMPRESSION: Extensive osseous metastatic disease with right frontal parietal convexity dural involvement. Stable disease when compared to 08/06/2018. Electronically Signed   By: Monte Fantasia M.D.   On: 11/08/2018 05:37      Assessment/Plan 1. Brain metastasis [facility])  [name] Riley is clinically and radiographically stable today.  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  We recommend she again repeat MRI brain in 3 months and return to clinic for evaluation at that time.  If stable, can begin to spread out MRI scans to q4 months.  We appreciate the opportunity to participate in the care of [name] Riley.    All questions were answered. The patient knows to call the clinic with any problems, questions or concerns. No barriers to learning were detected.  The total time spent in the encounter was 25 minutes and more than 50% was on counseling and  review of test results   Ventura Sellers, MD Medical Director of Neuro-Oncology [facility] at [place] 11/08/18 11:18 AM

## 2018-11-08 NOTE — Telephone Encounter (Signed)
Gave avs and calendar ° °

## 2018-11-09 ENCOUNTER — Ambulatory Visit: Payer: Medicare Other | Admitting: Internal Medicine

## 2018-11-14 ENCOUNTER — Inpatient Hospital Stay: Payer: Medicare Other

## 2018-11-30 ENCOUNTER — Telehealth: Payer: Self-pay | Admitting: Adult Health

## 2018-11-30 ENCOUNTER — Inpatient Hospital Stay: Payer: Medicare Other

## 2018-11-30 ENCOUNTER — Inpatient Hospital Stay: Payer: Medicare Other | Attending: Hematology and Oncology

## 2018-11-30 ENCOUNTER — Encounter: Payer: Self-pay | Admitting: Adult Health

## 2018-11-30 ENCOUNTER — Inpatient Hospital Stay (HOSPITAL_BASED_OUTPATIENT_CLINIC_OR_DEPARTMENT_OTHER): Payer: Medicare Other | Admitting: Adult Health

## 2018-11-30 ENCOUNTER — Telehealth: Payer: Self-pay

## 2018-11-30 VITALS — BP 161/88 | HR 78 | Temp 98.4°F | Resp 18 | Ht 68.0 in | Wt 140.0 lb

## 2018-11-30 DIAGNOSIS — Z170 Estrogen receptor positive status [ER+]: Secondary | ICD-10-CM

## 2018-11-30 DIAGNOSIS — Z923 Personal history of irradiation: Secondary | ICD-10-CM | POA: Insufficient documentation

## 2018-11-30 DIAGNOSIS — C50412 Malignant neoplasm of upper-outer quadrant of left female breast: Secondary | ICD-10-CM

## 2018-11-30 DIAGNOSIS — Z5112 Encounter for antineoplastic immunotherapy: Secondary | ICD-10-CM

## 2018-11-30 DIAGNOSIS — Z9221 Personal history of antineoplastic chemotherapy: Secondary | ICD-10-CM

## 2018-11-30 DIAGNOSIS — I10 Essential (primary) hypertension: Secondary | ICD-10-CM | POA: Diagnosis not present

## 2018-11-30 DIAGNOSIS — E039 Hypothyroidism, unspecified: Secondary | ICD-10-CM

## 2018-11-30 DIAGNOSIS — C50919 Malignant neoplasm of unspecified site of unspecified female breast: Secondary | ICD-10-CM

## 2018-11-30 DIAGNOSIS — T451X5A Adverse effect of antineoplastic and immunosuppressive drugs, initial encounter: Secondary | ICD-10-CM | POA: Insufficient documentation

## 2018-11-30 DIAGNOSIS — E876 Hypokalemia: Secondary | ICD-10-CM | POA: Diagnosis not present

## 2018-11-30 DIAGNOSIS — I2510 Atherosclerotic heart disease of native coronary artery without angina pectoris: Secondary | ICD-10-CM | POA: Insufficient documentation

## 2018-11-30 DIAGNOSIS — E785 Hyperlipidemia, unspecified: Secondary | ICD-10-CM | POA: Diagnosis not present

## 2018-11-30 DIAGNOSIS — D6481 Anemia due to antineoplastic chemotherapy: Secondary | ICD-10-CM

## 2018-11-30 DIAGNOSIS — C7931 Secondary malignant neoplasm of brain: Secondary | ICD-10-CM

## 2018-11-30 DIAGNOSIS — D696 Thrombocytopenia, unspecified: Secondary | ICD-10-CM | POA: Insufficient documentation

## 2018-11-30 DIAGNOSIS — Z79811 Long term (current) use of aromatase inhibitors: Secondary | ICD-10-CM | POA: Insufficient documentation

## 2018-11-30 DIAGNOSIS — R197 Diarrhea, unspecified: Secondary | ICD-10-CM | POA: Diagnosis not present

## 2018-11-30 DIAGNOSIS — Z800 Family history of malignant neoplasm of digestive organs: Secondary | ICD-10-CM | POA: Insufficient documentation

## 2018-11-30 DIAGNOSIS — C7951 Secondary malignant neoplasm of bone: Secondary | ICD-10-CM

## 2018-11-30 DIAGNOSIS — Z8041 Family history of malignant neoplasm of ovary: Secondary | ICD-10-CM | POA: Diagnosis not present

## 2018-11-30 DIAGNOSIS — Z95828 Presence of other vascular implants and grafts: Secondary | ICD-10-CM

## 2018-11-30 LAB — CMP (CANCER CENTER ONLY)
ALT: 36 U/L (ref 0–44)
AST: 42 U/L — ABNORMAL HIGH (ref 15–41)
Albumin: 3.9 g/dL (ref 3.5–5.0)
Alkaline Phosphatase: 134 U/L — ABNORMAL HIGH (ref 38–126)
Anion gap: 10 (ref 5–15)
BUN: 14 mg/dL (ref 8–23)
CO2: 27 mmol/L (ref 22–32)
Calcium: 9.8 mg/dL (ref 8.9–10.3)
Chloride: 103 mmol/L (ref 98–111)
Creatinine: 1.05 mg/dL — ABNORMAL HIGH (ref 0.44–1.00)
GFR, Est AFR Am: >60 mL/min (ref >60)
GFR, Estimated: 55 mL/min — ABNORMAL LOW (ref >60)
Glucose, Bld: 120 mg/dL — ABNORMAL HIGH (ref 70–99)
Potassium: 3.7 mmol/L (ref 3.5–5.1)
Sodium: 140 mmol/L (ref 135–145)
Total Bilirubin: 0.5 mg/dL (ref 0.3–1.2)
Total Protein: 6.9 g/dL (ref 6.5–8.1)

## 2018-11-30 LAB — CBC WITH DIFFERENTIAL (CANCER CENTER ONLY)
Abs Immature Granulocytes: 0.01 10*3/uL (ref 0.00–0.07)
Basophils Absolute: 0 10*3/uL (ref 0.0–0.1)
Basophils Relative: 1 %
Eosinophils Absolute: 0.1 10*3/uL (ref 0.0–0.5)
Eosinophils Relative: 2 %
HCT: 29.6 % — ABNORMAL LOW (ref 36.0–46.0)
Hemoglobin: 10.1 g/dL — ABNORMAL LOW (ref 12.0–15.0)
Immature Granulocytes: 0 %
Lymphocytes Relative: 54 %
Lymphs Abs: 1.8 10*3/uL (ref 0.7–4.0)
MCH: 34.2 pg — ABNORMAL HIGH (ref 26.0–34.0)
MCHC: 34.1 g/dL (ref 30.0–36.0)
MCV: 100.3 fL — ABNORMAL HIGH (ref 80.0–100.0)
Monocytes Absolute: 0.3 10*3/uL (ref 0.1–1.0)
Monocytes Relative: 8 %
Neutro Abs: 1.2 10*3/uL — ABNORMAL LOW (ref 1.7–7.7)
Neutrophils Relative %: 35 %
Platelet Count: 87 10*3/uL — ABNORMAL LOW (ref 150–400)
RBC: 2.95 MIL/uL — ABNORMAL LOW (ref 3.87–5.11)
RDW: 13.1 % (ref 11.5–15.5)
WBC Count: 3.4 10*3/uL — ABNORMAL LOW (ref 4.0–10.5)
nRBC: 0 % (ref 0.0–0.2)

## 2018-11-30 MED ORDER — DIPHENHYDRAMINE HCL 25 MG PO CAPS
50.0000 mg | ORAL_CAPSULE | Freq: Once | ORAL | Status: AC
  Administered 2018-11-30: 50 mg via ORAL

## 2018-11-30 MED ORDER — ACETAMINOPHEN 325 MG PO TABS
ORAL_TABLET | ORAL | Status: AC
  Filled 2018-11-30: qty 2

## 2018-11-30 MED ORDER — SODIUM CHLORIDE 0.9% FLUSH
10.0000 mL | Freq: Once | INTRAVENOUS | Status: AC
  Administered 2018-11-30: 10 mL
  Filled 2018-11-30: qty 10

## 2018-11-30 MED ORDER — ACETAMINOPHEN 325 MG PO TABS
650.0000 mg | ORAL_TABLET | Freq: Once | ORAL | Status: AC
  Administered 2018-11-30: 650 mg via ORAL

## 2018-11-30 MED ORDER — PREDNISONE 50 MG PO TABS: ORAL_TABLET | ORAL | 0 refills | Status: DC

## 2018-11-30 MED ORDER — SODIUM CHLORIDE 0.9 % IV SOLN
Freq: Once | INTRAVENOUS | Status: AC
  Administered 2018-11-30: 12:00:00 via INTRAVENOUS
  Filled 2018-11-30: qty 250

## 2018-11-30 MED ORDER — SODIUM CHLORIDE 0.9% FLUSH
10.0000 mL | INTRAVENOUS | Status: DC | PRN
  Administered 2018-11-30: 10 mL
  Filled 2018-11-30: qty 10

## 2018-11-30 MED ORDER — METOPROLOL SUCCINATE ER 100 MG PO TB24: 100.0000 mg | ORAL_TABLET | Freq: Every day | ORAL | 3 refills | Status: DC

## 2018-11-30 MED ORDER — TRASTUZUMAB CHEMO 150 MG IV SOLR
6.0000 mg/kg | Freq: Once | INTRAVENOUS | Status: AC
  Administered 2018-11-30: 399 mg via INTRAVENOUS
  Filled 2018-11-30: qty 19

## 2018-11-30 MED ORDER — DIPHENHYDRAMINE HCL 25 MG PO CAPS
ORAL_CAPSULE | ORAL | Status: AC
  Filled 2018-11-30: qty 2

## 2018-11-30 MED ORDER — HEPARIN SOD (PORK) LOCK FLUSH 100 UNIT/ML IV SOLN
500.0000 [IU] | Freq: Once | INTRAVENOUS | Status: AC | PRN
  Administered 2018-11-30: 500 [IU]
  Filled 2018-11-30: qty 5

## 2018-11-30 NOTE — Patient Instructions (Signed)
[facility] Discharge Instructions for Patients Receiving Chemotherapy  Today you received the following chemotherapy agents Herceptin  To help prevent nausea and vomiting after your treatment, we encourage you to take your nausea medication as directed   If you develop nausea and vomiting that is not controlled by your nausea medication, call the clinic.   BELOW ARE SYMPTOMS THAT SHOULD BE REPORTED IMMEDIATELY:  *FEVER GREATER THAN 100.5 F  *CHILLS WITH OR WITHOUT FEVER  NAUSEA AND VOMITING THAT IS NOT CONTROLLED WITH YOUR NAUSEA MEDICATION  *UNUSUAL SHORTNESS OF BREATH  *UNUSUAL BRUISING OR BLEEDING  TENDERNESS IN MOUTH AND THROAT WITH OR WITHOUT PRESENCE OF ULCERS  *URINARY PROBLEMS  *BOWEL PROBLEMS  UNUSUAL RASH Items with * indicate a potential emergency and should be followed up as soon as possible.  Feel free to call the clinic should you have any questions or concerns. The clinic phone number is [phone].  Please show the CHEMO ALERT CARD at check-in to the Emergency Department and triage nurse.   

## 2018-11-30 NOTE — Assessment & Plan Note (Addendum)
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 Was on Arimidex since 08/22/2012 to 03/30/16 Recent treatment: Kadcyla Subcutaneous nodule excisionleft chest: Infiltrating carcinoma breast primary, ER positive, PR negative, HER-2 positive  06/04/2018:CTCAP andBone scan: Progression of metastatic disease involving the bone, renal lesion and lung nodule  CT chest abdomen pelvis: 09/06/2018 interval resolution of the right renal lesions, no substantial change in the 6 mm right lower lobe lung nodule, stable bone metastases There are areas of segmental edema concerning for pyelonephritis or embolic disease  Brain MRI showing dural based metastases: Because the patient is asymptomatic we decided to not perform any procedures at this time.  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 Fatigue: intermittent and she is managing well on her own. 5. Arthralgias: managed with movement and tylenol if needed.  I am unsure the frequency of Stephanine's echocardiograms.  She has no sign of HF on exam today. I will review and f/u with Dr. Lindi Adie when he returns on 12/03/2018 as to what frequency she needs.    She will undergo restaging with bone scan and CT chest abdomen and pelvis on 12/24/2018 and will see Dr. Lindi Adie after.

## 2018-11-30 NOTE — Progress Notes (Signed)
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 stage IIB    12/13/2011 - 06/28/2012 Chemotherapy    4 cycles of FEC followed by 4 cycles of Taxotere    07/17/2012 - 08/22/2012 Radiation Therapy    Adjuvant radiation therapy    08/22/2012 - 03/16/2016 Anti-estrogen oral therapy    Arimidex 1 mg daily    03/16/2016 Relapse/Recurrence    Subcutaneous nodule excision left chest: Infiltrating carcinoma breast primary, ER positive, PR negative    03/29/2016 Imaging    CT CAP and bone scan: Lytic lesions T8 vertebral, T1 posterior element, subcutaneous nodule left lateral chest wall, nonspecific lung nodules; Bone scan: Mets to kull, left humerus, left eighth rib, T7/T8, sternum, left acetabulum    04/28/2016 - 06/17/2017 Chemotherapy    Herceptin, lapatinib, Faslodex, Zometa every 4 weeks, lapatinib discontinued in September 2018 due to elevation of LFTs    06/22/2017 Relapse/Recurrence    Surgical excision:Soft tissue mass left lateral chest wall primary breast Riley, soft tissue mass left medial chest wall breast Riley, tumor is within the dermis extending to the subcutaneous adipose tissue and involves portions of skeletal muscle    08/2017 - 02/16/2018 Chemotherapy    Faslodex with Herceptin and Perjeta along with Zometa every 4 weeks     03/02/2018 - 05/25/2018 Chemotherapy    Kadcyla     05/11/2018 Imaging     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 Associated vasogenic edema within the underlying right cerebral hemisphere without significant midline shift. Signal abnormality throughout the visualized bone marrow, compatible with osseous metastatic disease.       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      CURRENT THERAPY: Abemaciclib, Herceptin, Letrozole, Delton See  INTERVAL HISTORY: Gavyn Zoss [name] 68 y.o. female returns for evaluation of her metastatic breast Riley.  She is currently receiving treatment with Abemaciclib, Herceptin, Letrozole and Xgeva.    She takes the Abemaciclib BID.  She says she is tolerating this well.  She notes that Dr. Lindi Adie has been watching her platelet count closely.  Her plt count 4 weeks ago was 74, today it was 87.  She says that she has diarrhea intermittently.  For the most part it is controlled, but notes sometimes she will just have an episode.   Chasta receives Herceptin every 4 weeks.  She notes some fatigue the first few days after that, but otherwise is tolerating it well.    She is taking Letrozole daily.  She denies hot flashes or vaginal  dryness.  She has occasional stiffness in her legs after sitting for long periods of time, and occasional stiffness in her shoulders.  This is managed by staying active and taking tylenol as needed.    She is receiving the Xgeva every 12 weeks.  She last received this on 11/02/2018.  This will be due again on 01/20/2019.  Since her last visit she underwent MRI brain on 11/08/2018 that showed extensive osseous metastatic disease with right frontal parietal convexity dural involvement.  This was considered stable when compared to MRI from 08/06/2018.  She also saw Dr. Mickeal Skinner on 11/08/2018.  She noted some numbness in her feet when she saw him, and f/u regarding her brain mets.  She is taking Keppra and seizure activity is controlled.  She feels like her left hand weakness is improving.    Her last echocardiogram was on 08/30/2018 and showed a well preserved ejection fraction of 60-65%.    [name] Riley has bone scan and CT chest abdomen and pelvis scheduled on 12/24/2018.     Patient Active Problem List   Diagnosis Date Noted  . Brain metastasis ([place]) 07/13/2018  . Goals of care, counseling/discussion 06/05/2018  . UTI (urinary tract infection) 05/31/2018  . Sepsis secondary to UTI ([place]) 05/31/2018  . Thrombocytopenia ([place]) 05/31/2018  . Hypokalemia 05/31/2018  . Port-A-Cath in place 03/23/2018  . Elevated LFTs   . 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 Nodule of skin of breast 03/06/2017  . Abscess, abdomen 07/06/2016  . Metastasis to bone ([place]) 06/01/2016  . Pneumothorax on left 04/25/2016  . Chest pain 10/23/2014  . Radiation-induced dermatitis 07/27/2012  . Hypertension   . Breast Riley of upper-outer quadrant of left female breast ([place]) 09/01/2011  . Hypothyroidism 06/12/2009  . HYPERLIPIDEMIA 06/12/2009  . Essential hypertension 06/12/2009  . Coronary atherosclerosis 06/12/2009  . DIZZINESS 06/12/2009  . PALPITATIONS 06/12/2009  . Heart attack ([place]) 09/16/2000    is allergic to  cantaloupe extract allergy skin test; contrast media [iodinated diagnostic agents]; pravastatin; and zosyn [piperacillin sod-tazobactam so].  MEDICAL HISTORY: Past Medical History:  Diagnosis Date  . Allergy   . Breast Riley ([place]) 08/25/2011   L , invasive ductal carcinoma, ER/PR +,HER2 -  . Riley [facility])    left breast Riley  . Coronary artery disease 2001  . Heart attack [facility]) 09/2000   Sep 25, 2000  --no intervention  . History of chemotherapy comp. 08/22/2012   4 cycles of FEC and $ cycles of Taxotere  . Hyperlipidemia   . Hypertension   . Hypothyroidism   . PONV (postoperative nausea and vomiting)    gets sick from anesthesia  . Status post radiation therapy 07/09/12 - 08/22/2012   Left Breast, 60.4 gray    SURGICAL HISTORY: Past Surgical History:  Procedure Laterality Date  . ABDOMINAL HYSTERECTOMY  1998   TAH, oophorectomy  . APPENDECTOMY  1970  . BREAST SURGERY  1998   removal of benign lump in rt breast  . BREAST SURGERY  11/14/11   right simple mastectomy,  left mrm  . INCISION AND DRAINAGE OF WOUND Left 07/01/2017   Procedure: IRRIGATION AND DEBRIDEMENT CHEST WALL ABSCESS;  Surgeon: Donnie Mesa, MD;  Location: [facility];  Service: General;  Laterality: Left;  Marland Kitchen MASS EXCISION Left 06/22/2017   Procedure: EXCISION OF CHEST WALL MASSES;  Surgeon: Donnie Mesa, MD;  Location: [place];  Service: General;  Laterality: Left;  Marland Kitchen MASTECTOMY Bilateral    for left breast Riley  . OVARIAN CYST SURGERY Right 1970  . PORT-A-CATH REMOVAL  08/30/2012   Procedure: REMOVAL PORT-A-CATH;  Surgeon: Imogene Burn. [name], MD;  Location: [place];  Service: General;  Laterality: Right;  port removal  . PORTACATH PLACEMENT  11/14/2011   Procedure: INSERTION PORT-A-CATH;  Surgeon: Imogene Burn. [name], MD;  Location: [place];  Service: General;  Laterality: Right;  . PORTACATH PLACEMENT N/A 04/25/2016   Procedure: INSERTION PORT-A-CATH LEFT CHEST;  Surgeon: Donnie Mesa, MD;  Location:  [place];  Service: General;  Laterality: N/A;  . skin tags  05/09/1997   left axillary left neck skin tags  . TONSILLECTOMY  1968    SOCIAL HISTORY: Social History   Socioeconomic History  . Marital status: Married    Spouse name: Not on file  . Number of children: 3  . Years of education: Not on file  . Highest education level: Not on file  Occupational History  . Occupation: Works at [employer]  . Financial resource strain: Not on file  . Food insecurity:    Worry: Not on file    Inability: Not on file  . Transportation needs:    Medical: Not on file    Non-medical: Not on file  Tobacco Use  . Smoking status: Never Smoker  . Smokeless tobacco: Never Used  Substance and Sexual Activity  . Alcohol use: No  . Drug use: No  . Sexual activity: Yes    Birth control/protection: Surgical    Comment: menarche 48, Parity age 70, G53, P3, 1 miscarriage,  HRT x 5-10 yrs, Mild Hot Flashes  Lifestyle  . Physical activity:    Days per week: Not on file    Minutes per session: Not on file  . Stress: Not on file  Relationships  . Social connections:    Talks on phone: Not on file    Gets together: Not on file    Attends religious service: Not on file    Active member of club or organization: Not on file    Attends meetings of clubs or organizations: Not on file    Relationship status: Not on file  . Intimate partner violence:    Fear of current or ex partner: Not on file    Emotionally abused: Not on file    Physically abused: Not on file    Forced sexual activity: Not on file  Other Topics Concern  . Not on file  Social History Narrative   Lives at home.    FAMILY HISTORY: Family History  Problem Relation Age of Onset  . Hypertension Maternal Grandmother   . Diabetes Maternal Grandmother   . Riley Father 78       lung Riley and Prostate Riley  . Hypertension Mother   . Riley Paternal Aunt        ovarian  . Riley Cousin         breast, paternal cousin  . Riley Paternal Uncle        stomach  .  Riley Paternal Grandfather        Esophagus  . Colon Riley Neg Hx     Review of Systems - Oncology    PHYSICAL EXAMINATION  ECOG PERFORMANCE STATUS: 1 - Symptomatic but completely ambulatory  Vitals:   11/30/18 1013  BP: (!) 161/88  Pulse: 78  Resp: 18  Temp: 98.4 F (36.9 C)  SpO2: 100%    Physical Exam Constitutional:      General: She is not in acute distress.    Appearance: Normal appearance.  HENT:     Head: Normocephalic and atraumatic.     Mouth/Throat:     Mouth: Mucous membranes are moist.     Pharynx: Oropharynx is clear. No oropharyngeal exudate or posterior oropharyngeal erythema.  Eyes:     General: No scleral icterus.    Pupils: Pupils are equal, round, and reactive to light.  Neck:     Musculoskeletal: Neck supple.  Cardiovascular:     Rate and Rhythm: Normal rate and regular rhythm.     Pulses: Normal pulses.     Heart sounds: Normal heart sounds.  Pulmonary:     Effort: Pulmonary effort is normal.     Breath sounds: Normal breath sounds.  Abdominal:     General: Abdomen is flat. There is no distension.     Palpations: Abdomen is soft.     Tenderness: There is no abdominal tenderness.  Musculoskeletal:        General: No swelling.  Lymphadenopathy:     Cervical: No cervical adenopathy.  Skin:    General: Skin is warm and dry.     Capillary Refill: Capillary refill takes less than 2 seconds.     Findings: No rash.  Neurological:     General: No focal deficit present.     Mental Status: She is alert.  Psychiatric:        Mood and Affect: Mood normal.     LABORATORY DATA:  CBC    Component Value Date/Time   WBC 3.4 (L) 11/30/2018 0932   WBC 4.3 07/01/2018 1036   RBC 2.95 (L) 11/30/2018 0932   HGB 10.1 (L) 11/30/2018 0932   HGB 11.5 (L) 09/22/2017 0958   HCT 29.6 (L) 11/30/2018 0932   HCT 34.9 09/22/2017 0958   PLT 87 (L) 11/30/2018 0932   PLT 295  09/22/2017 0958   MCV 100.3 (H) 11/30/2018 0932   MCV 94.3 09/22/2017 0958   MCH 34.2 (H) 11/30/2018 0932   MCHC 34.1 11/30/2018 0932   RDW 13.1 11/30/2018 0932   RDW 15.3 (H) 09/22/2017 0958   LYMPHSABS 1.8 11/30/2018 0932   LYMPHSABS 2.2 09/22/2017 0958   MONOABS 0.3 11/30/2018 0932   MONOABS 0.8 09/22/2017 0958   EOSABS 0.1 11/30/2018 0932   EOSABS 0.1 09/22/2017 0958   BASOSABS 0.0 11/30/2018 0932   BASOSABS 0.0 09/22/2017 0958    CMP     Component Value Date/Time   NA 140 11/30/2018 0932   NA 140 09/22/2017 0958   K 3.7 11/30/2018 0932   K 3.7 09/22/2017 0958   CL 103 11/30/2018 0932   CL 105 08/10/2012 0908   CO2 27 11/30/2018 0932   CO2 26 09/22/2017 0958   GLUCOSE 120 (H) 11/30/2018 0932   GLUCOSE 80 09/22/2017 0958   GLUCOSE 81 08/10/2012 0908   BUN 14 11/30/2018 0932   BUN 12.8 09/22/2017 0958   CREATININE 1.05 (H) 11/30/2018 0932   CREATININE 0.6 09/22/2017 0958   CALCIUM 9.8 11/30/2018 0932  CALCIUM 9.1 09/22/2017 0958   PROT 6.9 11/30/2018 0932   PROT 7.0 09/22/2017 0958   ALBUMIN 3.9 11/30/2018 0932   ALBUMIN 4.0 09/22/2017 0958   AST 42 (H) 11/30/2018 0932   AST 37 (H) 09/22/2017 0958   ALT 36 11/30/2018 0932   ALT 31 09/22/2017 0958   ALKPHOS 134 (H) 11/30/2018 0932   ALKPHOS 140 09/22/2017 0958   BILITOT 0.5 11/30/2018 0932   BILITOT 0.41 09/22/2017 0958   GFRNONAA 55 (L) 11/30/2018 0932   GFRAA >60 11/30/2018 0932       ASSESSMENT and THERAPY PLAN:   Breast Riley of upper-outer quadrant of left female breast ([place]) Left breast invasive ductal carcinomaT2, N1, 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 Was on Arimidex since 08/22/2012 to 03/30/16 Recent treatment: Kadcyla Subcutaneous nodule excisionleft chest: Infiltrating carcinoma breast primary, ER positive, PR negative, HER-2  positive  06/04/2018:CTCAP andBone scan: Progression of metastatic disease involving the bone, renal lesion and lung nodule  CT chest abdomen pelvis: 09/06/2018 interval resolution of the right renal lesions, no substantial change in the 6 mm right lower lobe lung nodule, stable bone metastases There are areas of segmental edema concerning for pyelonephritis or embolic disease  Brain MRI showing dural based metastases: Because the patient is asymptomatic we decided to not perform any procedures at this time.  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 Fatigue: intermittent and she is managing well on her own. 5. Arthralgias: managed with movement and tylenol if needed.  I am unsure the frequency of [name] Riley's echocardiograms.  She has no sign of HF on exam today. I will review and f/u with Dr. Lindi Adie when he returns on 12/03/2018 as to what frequency she needs.    She will undergo restaging with bone scan and CT chest abdomen and pelvis on 12/24/2018 and will see Dr. Lindi Adie after.      All questions were answered. The patient knows to call the clinic with any problems, questions or concerns. We can certainly see the patient much sooner if necessary.  A total of (30) minutes of face-to-face time was spent with this patient with greater than 50% of that time in counseling and care-coordination.  This note was electronically signed. Scot Dock, NP 11/30/2018

## 2018-11-30 NOTE — Telephone Encounter (Signed)
-----   Message from Gardenia Phlegm, NP sent at 11/30/2018 12:14 PM EST ----- Please let patient know that her kidney function is slightly increased.  Recommend she drink plenty of water.   ----- Message ----- From: Buel Ream, Lab In [place] Sent: 11/30/2018   9:49 AM EST To: Nicholas Lose, MD

## 2018-11-30 NOTE — Telephone Encounter (Signed)
Gave avs and calendar ° °

## 2018-11-30 NOTE — Telephone Encounter (Signed)
Error, LB, LPN

## 2018-11-30 NOTE — Telephone Encounter (Signed)
Spoke with patient to inform that kidney function has increased slightly.  Per NP recommendation to drink plenty of water.  Patient voiced understanding and had no concerns at this time.

## 2018-12-04 ENCOUNTER — Other Ambulatory Visit: Payer: Self-pay

## 2018-12-04 ENCOUNTER — Telehealth: Payer: Self-pay

## 2018-12-04 DIAGNOSIS — C50412 Malignant neoplasm of upper-outer quadrant of left female breast: Secondary | ICD-10-CM

## 2018-12-04 DIAGNOSIS — Z79899 Other long term (current) drug therapy: Secondary | ICD-10-CM

## 2018-12-04 DIAGNOSIS — Z170 Estrogen receptor positive status [ER+]: Secondary | ICD-10-CM

## 2018-12-04 DIAGNOSIS — Z5181 Encounter for therapeutic drug level monitoring: Secondary | ICD-10-CM

## 2018-12-04 NOTE — Telephone Encounter (Signed)
Spoke with pt wanting to schedule her echo. She gets echo every 3 months in [place]. Will place order and pt can call to schedule her appt.

## 2018-12-04 NOTE — Progress Notes (Signed)
Pt requesting to have echo scheduled in [place]. Will place order to be performed in [place]. Pt due for 3 month echo.

## 2018-12-19 ENCOUNTER — Ambulatory Visit (INDEPENDENT_AMBULATORY_CARE_PROVIDER_SITE_OTHER): Payer: Medicare Other

## 2018-12-19 DIAGNOSIS — Z5181 Encounter for therapeutic drug level monitoring: Secondary | ICD-10-CM | POA: Diagnosis not present

## 2018-12-19 DIAGNOSIS — Z79899 Other long term (current) drug therapy: Secondary | ICD-10-CM | POA: Diagnosis not present

## 2018-12-24 ENCOUNTER — Ambulatory Visit (HOSPITAL_COMMUNITY)
Admission: RE | Admit: 2018-12-24 | Discharge: 2018-12-24 | Disposition: A | Discharge status: Home / Self Care | Payer: Medicare Other | Source: Ambulatory Visit | Attending: Hematology and Oncology | Admitting: Hematology and Oncology

## 2018-12-24 ENCOUNTER — Encounter (HOSPITAL_COMMUNITY)
Admission: RE | Admit: 2018-12-24 | Discharge: 2018-12-24 | Disposition: A | Discharge status: Home / Self Care | Payer: Medicare Other | Source: Ambulatory Visit | Attending: Hematology and Oncology | Admitting: Hematology and Oncology

## 2018-12-24 ENCOUNTER — Inpatient Hospital Stay: Payer: Medicare Other | Attending: Hematology and Oncology

## 2018-12-24 DIAGNOSIS — K5900 Constipation, unspecified: Secondary | ICD-10-CM | POA: Diagnosis not present

## 2018-12-24 DIAGNOSIS — Z79899 Other long term (current) drug therapy: Secondary | ICD-10-CM | POA: Diagnosis not present

## 2018-12-24 DIAGNOSIS — R197 Diarrhea, unspecified: Secondary | ICD-10-CM | POA: Insufficient documentation

## 2018-12-24 DIAGNOSIS — C7951 Secondary malignant neoplasm of bone: Secondary | ICD-10-CM

## 2018-12-24 DIAGNOSIS — Z5112 Encounter for antineoplastic immunotherapy: Secondary | ICD-10-CM | POA: Insufficient documentation

## 2018-12-24 DIAGNOSIS — Z170 Estrogen receptor positive status [ER+]: Secondary | ICD-10-CM | POA: Diagnosis present

## 2018-12-24 DIAGNOSIS — Z7982 Long term (current) use of aspirin: Secondary | ICD-10-CM | POA: Insufficient documentation

## 2018-12-24 DIAGNOSIS — Z9221 Personal history of antineoplastic chemotherapy: Secondary | ICD-10-CM | POA: Diagnosis not present

## 2018-12-24 DIAGNOSIS — Z923 Personal history of irradiation: Secondary | ICD-10-CM | POA: Insufficient documentation

## 2018-12-24 DIAGNOSIS — C50412 Malignant neoplasm of upper-outer quadrant of left female breast: Secondary | ICD-10-CM | POA: Insufficient documentation

## 2018-12-24 DIAGNOSIS — Z95828 Presence of other vascular implants and grafts: Secondary | ICD-10-CM

## 2018-12-24 DIAGNOSIS — Z9012 Acquired absence of left breast and nipple: Secondary | ICD-10-CM | POA: Insufficient documentation

## 2018-12-24 MED ORDER — SODIUM CHLORIDE 0.9% FLUSH
10.0000 mL | Freq: Once | INTRAVENOUS | Status: AC
  Administered 2018-12-24: 10 mL
  Filled 2018-12-24: qty 10

## 2018-12-24 MED ORDER — HEPARIN SOD (PORK) LOCK FLUSH 100 UNIT/ML IV SOLN
INTRAVENOUS | Status: AC
  Filled 2018-12-24: qty 5

## 2018-12-24 MED ORDER — HEPARIN SOD (PORK) LOCK FLUSH 100 UNIT/ML IV SOLN
500.0000 [IU] | Freq: Once | INTRAVENOUS | Status: AC
  Administered 2018-12-24: 500 [IU] via INTRAVENOUS

## 2018-12-24 MED ORDER — TECHNETIUM TC 99M MEDRONATE IV KIT
20.0000 | PACK | Freq: Once | INTRAVENOUS | Status: AC | PRN
  Administered 2018-12-24: 21.1 via INTRAVENOUS

## 2018-12-24 MED ORDER — SODIUM CHLORIDE (PF) 0.9 % IJ SOLN
INTRAMUSCULAR | Status: AC
  Filled 2018-12-24: qty 50

## 2018-12-24 MED ORDER — IOHEXOL 300 MG/ML  SOLN
100.0000 mL | Freq: Once | INTRAMUSCULAR | Status: AC | PRN
  Administered 2018-12-24: 100 mL via INTRAVENOUS

## 2018-12-27 NOTE — Progress Notes (Signed)
Patient Care Team: Bridget Hartshorn, NP as PCP - General (Adult Health Nurse Practitioner)  DIAGNOSIS:    ICD-10-CM   1. Malignant neoplasm of upper-outer quadrant of left breast in female, estrogen receptor positive ([place]) C50.412    Z17.0     SUMMARY OF ONCOLOGIC HISTORY:   Breast cancer of upper-outer quadrant of left female breast ([place])   11/14/2011 Surgery    Bilateral mastectomy, prophylactic on the right, left breast IDC 3/18 lymph nodes positive with extracapsular extension ER 89%, PR 81%, HER-2 negative, Ki-67 79% T2 N1 A. stage IIB    12/13/2011 - 06/28/2012 Chemotherapy    4 cycles of FEC followed by 4 cycles of Taxotere    07/17/2012 - 08/22/2012 Radiation Therapy    Adjuvant radiation therapy    08/22/2012 - 03/16/2016 Anti-estrogen oral therapy    Arimidex 1 mg daily    03/16/2016 Relapse/Recurrence    Subcutaneous nodule excision left chest: Infiltrating carcinoma breast primary, ER positive, PR negative    03/29/2016 Imaging    CT CAP and bone scan: Lytic lesions T8 vertebral, T1 posterior element, subcutaneous nodule left lateral chest wall, nonspecific lung nodules; Bone scan: Mets to kull, left humerus, left eighth rib, T7/T8, sternum, left acetabulum    04/28/2016 - 06/17/2017 Chemotherapy    Herceptin, lapatinib, Faslodex, Zometa every 4 weeks, lapatinib discontinued in September 2018 due to elevation of LFTs    06/22/2017 Relapse/Recurrence    Surgical excision:Soft tissue mass left lateral chest wall primary breast cancer, soft tissue mass left medial chest wall breast cancer, tumor is within the dermis extending to the subcutaneous adipose tissue and involves portions of skeletal muscle    08/2017 - 02/16/2018 Chemotherapy    Faslodex with Herceptin and Perjeta along with Zometa every 4 weeks     03/02/2018 - 05/25/2018 Chemotherapy    Kadcyla     05/11/2018 Imaging    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 Associated vasogenic  edema within the underlying right cerebral hemisphere without significant midline shift. Signal abnormality throughout the visualized bone marrow, compatible with osseous metastatic disease.       06/04/2018 Imaging    CT CAP: Right lower lobe lung nodule 7 mm (was 5 mm); multiple bone metastases throughout the spine and ribs sternum scapula and humerus, slightly increased lower thoracic mets, right renal lesion 2.5 cm (was 1.2 cm) right femur met increased from 2.1 cm to 2.9 cm    06/15/2018 -  Anti-estrogen oral therapy    Abemaciclib, Herceptin, letrozole, Xgeva     Metastatic breast cancer ([place])   06/29/2017 Initial Diagnosis    Metastatic breast cancer ([place])    06/15/2018 -  Chemotherapy    The patient had trastuzumab (HERCEPTIN) 546 mg in sodium chloride 0.9 % 250 mL chemo infusion, 8 mg/kg = 546 mg, Intravenous,  Once, 7 of 12 cycles Administration: 546 mg (06/15/2018), 399 mg (09/07/2018), 399 mg (07/13/2018), 399 mg (10/12/2018), 399 mg (11/02/2018), 399 mg (11/30/2018), 399 mg (08/10/2018)  for chemotherapy treatment.      CHIEF COMPLIANT: Follow-up on Herceptin with abemaciclib and letrozole and Xgeva  INTERVAL HISTORY: [name] Riley is a 68 y.o. with above-mentioned history of metastatic breast cancer currently on abemaciclib with letrozole along with Herceptin Xgeva. An ECHO from 12/19/18 showed an ejection fraction in the range of 60-65%. A CT CAP from 12/24/18 showed stable osseous metastatic disease and no evidence of other metastases in the chest, abdomen or pelvis. A bone scan from  12/24/18 showed diffuse but stable osseous metastatic disease. She presents to the clinic today for Herceptin and to review the results of her recent scans.  She has bilateral groin discomfort.  REVIEW OF SYSTEMS:   Constitutional: Denies fevers, chills or abnormal weight loss Eyes: Denies blurriness of vision Ears, nose, mouth, throat, and face: Denies mucositis or sore throat Respiratory: Denies cough,  dyspnea or wheezes Cardiovascular: Denies palpitation, chest discomfort Gastrointestinal: Denies nausea, heartburn or change in bowel habits Skin: Denies abnormal skin rashes Lymphatics: Denies new lymphadenopathy or easy bruising Neurological: Denies numbness, tingling or new weaknesses Behavioral/Psych: Mood is stable, no new changes  Extremities: No lower extremity edema Breast: denies any pain or lumps or nodules in either breasts All other systems were reviewed with the patient and are negative.  I have reviewed the past medical history, past surgical history, social history and family history with the patient and they are unchanged from previous note.  ALLERGIES:  is allergic to cantaloupe extract allergy skin test; contrast media [iodinated diagnostic agents]; pravastatin; and zosyn [piperacillin sod-tazobactam so].  MEDICATIONS:  Current Outpatient Medications  Medication Sig Dispense Refill  . acetaminophen (TYLENOL) 500 MG tablet Take 1,000 mg by mouth every 6 (six) hours as needed for moderate pain.    Marland Kitchen aspirin 81 MG tablet Take 81 mg by mouth daily.      . cholecalciferol (VITAMIN D) 1000 units tablet Take 1 tablet (1,000 Units total) by mouth daily.    . diphenhydrAMINE (BENADRYL) 25 MG tablet Take 50 mg by mouth at bedtime as needed for sleep.    Marland Kitchen letrozole (FEMARA) 2.5 MG tablet Take 1 tablet (2.5 mg total) by mouth daily. 90 tablet 3  . levETIRAcetam (KEPPRA) 500 MG tablet TAKE ONE TABLET BY MOUTH TWICE DAILY 60 tablet 2  . levothyroxine (SYNTHROID, LEVOTHROID) 100 MCG tablet Take 100 mcg by mouth daily before breakfast. BRAND ONLY    . lidocaine-prilocaine (EMLA) cream Apply topically as needed (for port access). Apply to affected area once 30 g 3  . metoprolol succinate (TOPROL-XL) 100 MG 24 hr tablet Take 1 tablet (100 mg total) by mouth daily. 90 tablet 3  . Multiple Vitamins-Minerals (MULTIVITAMIN WITH MINERALS) tablet Take 1 tablet by mouth daily.      .  ondansetron (ZOFRAN-ODT) 4 MG disintegrating tablet Take 1 tablet (4 mg total) by mouth every 6 (six) hours as needed for nausea or vomiting. 30 tablet 3  . predniSONE (DELTASONE) 50 MG tablet Take 1 14m tablet by mouth 13 hrs, 7hrs, and 1 hr prior to CT Scan. (CT dye allergy protocol) 3 tablet 0  . VERZENIO 150 MG tablet TAKE 1 TABLET (150 MG TOTAL) BY MOUTH 2 (TWO) TIMES DAILY. SWALLOW TABLETS WHOLE. DO NOT CHEW, CRUSH, OR SPLIT TABLETS BEFORE SWALLOWING. 56 tablet 1   No current facility-administered medications for this visit.    Facility-Administered Medications Ordered in Other Visits  Medication Dose Route Frequency Provider Last Rate Last Dose  . 0.9 %  sodium chloride infusion   Intravenous Continuous TDonnie Mesa MD      . acetaminophen (TYLENOL) tablet 975 mg  975 mg Oral Q6H TDonnie Mesa MD      . enoxaparin (LOVENOX) injection 40 mg  40 mg Subcutaneous Q24H TDonnie Mesa MD      . morphine 4 MG/ML injection 2-4 mg  2-4 mg Intravenous Q2H PRN TDonnie Mesa MD      . ondansetron (ZOFRAN-ODT) disintegrating tablet 4 mg  4 mg Oral  Q6H PRN Donnie Mesa, MD       Or  . ondansetron (ZOFRAN) 4 mg in sodium chloride 0.9 % 50 mL IVPB  4 mg Intravenous Q6H PRN Donnie Mesa, MD      . oxyCODONE (Oxy IR/ROXICODONE) immediate release tablet 5-10 mg  5-10 mg Oral Q4H PRN Donnie Mesa, MD      . piperacillin-tazobactam (ZOSYN) 3.375 g in dextrose 5 % 50 mL IVPB  3.375 g Intravenous Q8H Donnie Mesa, MD      . prochlorperazine (COMPAZINE) tablet 10 mg  10 mg Oral Q6H PRN Donnie Mesa, MD       Or  . prochlorperazine (COMPAZINE) injection 5-10 mg  5-10 mg Intravenous Q6H PRN Donnie Mesa, MD        PHYSICAL EXAMINATION: ECOG PERFORMANCE STATUS: 1 - Symptomatic but completely ambulatory  Vitals:   12/28/18 1033  BP: (!) 156/95  Pulse: 75  Resp: 17  Temp: 98.3 F (36.8 C)  SpO2: 100%   Filed Weights   12/28/18 1033  Weight: 140 lb 1.6 oz (63.5 kg)    GENERAL:  alert, no distress and comfortable SKIN: skin color, texture, turgor are normal, no rashes or significant lesions EYES: normal, Conjunctiva are pink and non-injected, sclera clear OROPHARYNX: no exudate, no erythema and lips, buccal mucosa, and tongue normal  NECK: supple, thyroid normal size, non-tender, without nodularity LYMPH: no palpable lymphadenopathy in the cervical, axillary or inguinal LUNGS: clear to auscultation and percussion with normal breathing effort HEART: regular rate & rhythm and no murmurs and no lower extremity edema ABDOMEN: abdomen soft, non-tender and normal bowel sounds MUSCULOSKELETAL: no cyanosis of digits and no clubbing  NEURO: alert & oriented x 3 with fluent speech, no focal motor/sensory deficits EXTREMITIES: No lower extremity edema  LABORATORY DATA:  I have reviewed the data as listed CMP Latest Ref Rng & Units 12/28/2018 11/30/2018 11/02/2018  Glucose 70 - 99 mg/dL 92 120(H) 103(H)  BUN 8 - 23 mg/dL 13 14 12   Creatinine 0.44 - 1.00 mg/dL 0.98 1.05(H) 0.92  Sodium 135 - 145 mmol/L 141 140 140  Potassium 3.5 - 5.1 mmol/L 3.5 3.7 3.6  Chloride 98 - 111 mmol/L 104 103 103  CO2 22 - 32 mmol/L 27 27 28   Calcium 8.9 - 10.3 mg/dL 9.3 9.8 9.7  Total Protein 6.5 - 8.1 g/dL 6.8 6.9 7.0  Total Bilirubin 0.3 - 1.2 mg/dL 0.7 0.5 0.6  Alkaline Phos 38 - 126 U/L 119 134(H) 111  AST 15 - 41 U/L 45(H) 42(H) 32  ALT 0 - 44 U/L 47(H) 36 24    Lab Results  Component Value Date   WBC 2.8 (L) 12/28/2018   HGB 9.8 (L) 12/28/2018   HCT 29.3 (L) 12/28/2018   MCV 103.5 (H) 12/28/2018   PLT 77 (L) 12/28/2018   NEUTROABS 1.2 (L) 12/28/2018    ASSESSMENT & PLAN:  Breast cancer of upper-outer quadrant of left female breast ([facility]) Left breast invasive ductal carcinomaT2, N1, 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 Was on  Arimidex since 08/22/2012 to 03/30/16 Recent treatment: Kadcyla Subcutaneous nodule excisionleft chest: Infiltrating carcinoma breast primary, ER positive, PR negative, HER-2 positive  06/04/2018:12/24/2018 andBone scan: Progression of metastatic disease involving the bone, renal lesion and lung nodule Brain MRI showing dural based metastases: Because the patient is asymptomatic we  decided to not perform any procedures at this time.  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 Fatigue: intermittent and she is managing well on her own. 5. Arthralgias: managed with movement and tylenol if needed.  CT CAP and bone scan 12/24/2018: Stable bone metastases no new lesions Plan: Continue current therapy Follow-up monthly for Herceptin's and every 3 months for Xgeva.    No orders of the defined types were placed in this encounter.  The patient has a good understanding of the overall plan. she agrees with it. she will call with any problems that may develop before the next visit here.  Nicholas Lose, MD 12/28/2018  Julious Oka Dorshimer am acting as scribe for Dr. Nicholas Lose.  I have reviewed the above documentation for accuracy and completeness, and I agree with the above.

## 2018-12-28 ENCOUNTER — Inpatient Hospital Stay: Payer: Medicare Other

## 2018-12-28 ENCOUNTER — Other Ambulatory Visit: Payer: Self-pay

## 2018-12-28 ENCOUNTER — Inpatient Hospital Stay (HOSPITAL_BASED_OUTPATIENT_CLINIC_OR_DEPARTMENT_OTHER): Payer: Medicare Other | Admitting: Hematology and Oncology

## 2018-12-28 DIAGNOSIS — C50412 Malignant neoplasm of upper-outer quadrant of left female breast: Secondary | ICD-10-CM | POA: Diagnosis not present

## 2018-12-28 DIAGNOSIS — Z79899 Other long term (current) drug therapy: Secondary | ICD-10-CM

## 2018-12-28 DIAGNOSIS — Z923 Personal history of irradiation: Secondary | ICD-10-CM

## 2018-12-28 DIAGNOSIS — Z170 Estrogen receptor positive status [ER+]: Secondary | ICD-10-CM

## 2018-12-28 DIAGNOSIS — K5900 Constipation, unspecified: Secondary | ICD-10-CM

## 2018-12-28 DIAGNOSIS — Z95828 Presence of other vascular implants and grafts: Secondary | ICD-10-CM

## 2018-12-28 DIAGNOSIS — Z5112 Encounter for antineoplastic immunotherapy: Secondary | ICD-10-CM | POA: Diagnosis not present

## 2018-12-28 DIAGNOSIS — R197 Diarrhea, unspecified: Secondary | ICD-10-CM

## 2018-12-28 DIAGNOSIS — Z9221 Personal history of antineoplastic chemotherapy: Secondary | ICD-10-CM

## 2018-12-28 DIAGNOSIS — C50919 Malignant neoplasm of unspecified site of unspecified female breast: Secondary | ICD-10-CM

## 2018-12-28 DIAGNOSIS — C7951 Secondary malignant neoplasm of bone: Secondary | ICD-10-CM

## 2018-12-28 DIAGNOSIS — Z9012 Acquired absence of left breast and nipple: Secondary | ICD-10-CM

## 2018-12-28 DIAGNOSIS — Z7982 Long term (current) use of aspirin: Secondary | ICD-10-CM

## 2018-12-28 LAB — CBC WITH DIFFERENTIAL (CANCER CENTER ONLY)
Abs Immature Granulocytes: 0 K/uL (ref 0.00–0.07)
Basophils Absolute: 0 K/uL (ref 0.0–0.1)
Basophils Relative: 1 %
Eosinophils Absolute: 0 K/uL (ref 0.0–0.5)
Eosinophils Relative: 1 %
HCT: 29.3 % — ABNORMAL LOW (ref 36.0–46.0)
Hemoglobin: 9.8 g/dL — ABNORMAL LOW (ref 12.0–15.0)
Immature Granulocytes: 0 %
Lymphocytes Relative: 47 %
Lymphs Abs: 1.3 K/uL (ref 0.7–4.0)
MCH: 34.6 pg — ABNORMAL HIGH (ref 26.0–34.0)
MCHC: 33.4 g/dL (ref 30.0–36.0)
MCV: 103.5 fL — ABNORMAL HIGH (ref 80.0–100.0)
Monocytes Absolute: 0.2 K/uL (ref 0.1–1.0)
Monocytes Relative: 9 %
Neutro Abs: 1.2 K/uL — ABNORMAL LOW (ref 1.7–7.7)
Neutrophils Relative %: 42 %
Platelet Count: 77 K/uL — ABNORMAL LOW (ref 150–400)
RBC: 2.83 MIL/uL — ABNORMAL LOW (ref 3.87–5.11)
RDW: 13.7 % (ref 11.5–15.5)
WBC Count: 2.8 K/uL — ABNORMAL LOW (ref 4.0–10.5)
nRBC: 0 % (ref 0.0–0.2)

## 2018-12-28 LAB — CMP (CANCER CENTER ONLY)
ALT: 47 U/L — ABNORMAL HIGH (ref 0–44)
AST: 45 U/L — ABNORMAL HIGH (ref 15–41)
Albumin: 3.8 g/dL (ref 3.5–5.0)
Alkaline Phosphatase: 119 U/L (ref 38–126)
Anion gap: 10 (ref 5–15)
BUN: 13 mg/dL (ref 8–23)
CO2: 27 mmol/L (ref 22–32)
Calcium: 9.3 mg/dL (ref 8.9–10.3)
Chloride: 104 mmol/L (ref 98–111)
Creatinine: 0.98 mg/dL (ref 0.44–1.00)
GFR, Est AFR Am: >60 mL/min (ref >60)
GFR, Estimated: >60 mL/min (ref >60)
Glucose, Bld: 92 mg/dL (ref 70–99)
Potassium: 3.5 mmol/L (ref 3.5–5.1)
Sodium: 141 mmol/L (ref 135–145)
Total Bilirubin: 0.7 mg/dL (ref 0.3–1.2)
Total Protein: 6.8 g/dL (ref 6.5–8.1)

## 2018-12-28 MED ORDER — SODIUM CHLORIDE 0.9 % IV SOLN
Freq: Once | INTRAVENOUS | Status: AC
  Administered 2018-12-28: 12:00:00 via INTRAVENOUS
  Filled 2018-12-28: qty 250

## 2018-12-28 MED ORDER — ACETAMINOPHEN 325 MG PO TABS
650.0000 mg | ORAL_TABLET | Freq: Once | ORAL | Status: AC
  Administered 2018-12-28: 650 mg via ORAL

## 2018-12-28 MED ORDER — SODIUM CHLORIDE 0.9% FLUSH
10.0000 mL | INTRAVENOUS | Status: DC | PRN
  Administered 2018-12-28: 10 mL
  Filled 2018-12-28: qty 10

## 2018-12-28 MED ORDER — TRASTUZUMAB CHEMO 150 MG IV SOLR
6.0000 mg/kg | Freq: Once | INTRAVENOUS | Status: AC
  Administered 2018-12-28: 399 mg via INTRAVENOUS
  Filled 2018-12-28: qty 19

## 2018-12-28 MED ORDER — DIPHENHYDRAMINE HCL 25 MG PO CAPS
50.0000 mg | ORAL_CAPSULE | Freq: Once | ORAL | Status: AC
  Administered 2018-12-28: 50 mg via ORAL

## 2018-12-28 MED ORDER — SODIUM CHLORIDE 0.9% FLUSH
10.0000 mL | Freq: Once | INTRAVENOUS | Status: AC
  Administered 2018-12-28: 10 mL
  Filled 2018-12-28: qty 10

## 2018-12-28 MED ORDER — HEPARIN SOD (PORK) LOCK FLUSH 100 UNIT/ML IV SOLN
500.0000 [IU] | Freq: Once | INTRAVENOUS | Status: AC | PRN
  Administered 2018-12-28: 500 [IU]
  Filled 2018-12-28: qty 5

## 2018-12-28 NOTE — Assessment & Plan Note (Signed)
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 Was on Arimidex since 08/22/2012 to 03/30/16 Recent treatment: Kadcyla Subcutaneous nodule excisionleft chest: Infiltrating carcinoma breast primary, ER positive, PR negative, HER-2 positive  06/04/2018:12/24/2018 andBone scan: Progression of metastatic disease involving the bone, renal lesion and lung nodule Brain MRI showing dural based metastases: Because the patient is asymptomatic we decided to not perform any procedures at this time.  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 Fatigue: intermittent and she is managing well on her own. 5. Arthralgias: managed with movement and tylenol if needed.  CT CAP and bone scan 12/24/2018: Stable bone metastases no new lesions Plan: Continue current therapy Follow-up in 3 months with labs.

## 2018-12-28 NOTE — Patient Instructions (Signed)
[facility] Discharge Instructions for Patients Receiving Chemotherapy Today you received the following chemotherapy agents:  Herceptin To help prevent nausea and vomiting after your treatment, we encourage you to take your nausea medication as prescribed.   If you develop nausea and vomiting that is not controlled by your nausea medication, call the clinic.   BELOW ARE SYMPTOMS THAT SHOULD BE REPORTED IMMEDIATELY:  *FEVER GREATER THAN 100.5 F  *CHILLS WITH OR WITHOUT FEVER  NAUSEA AND VOMITING THAT IS NOT CONTROLLED WITH YOUR NAUSEA MEDICATION  *UNUSUAL SHORTNESS OF BREATH  *UNUSUAL BRUISING OR BLEEDING  TENDERNESS IN MOUTH AND THROAT WITH OR WITHOUT PRESENCE OF ULCERS  *URINARY PROBLEMS  *BOWEL PROBLEMS  UNUSUAL RASH Items with * indicate a potential emergency and should be followed up as soon as possible.  Feel free to call the clinic should you have any questions or concerns. The clinic phone number is [phone].  Please show the CHEMO ALERT CARD at check-in to the Emergency Department and triage nurse.   

## 2019-01-09 ENCOUNTER — Telehealth: Payer: Self-pay | Admitting: Hematology and Oncology

## 2019-01-09 NOTE — Telephone Encounter (Signed)
R/s appt per 3/24 sch message pt aware of appt date and time   

## 2019-01-10 ENCOUNTER — Other Ambulatory Visit: Payer: Self-pay | Admitting: Hematology and Oncology

## 2019-01-22 NOTE — Assessment & Plan Note (Addendum)
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 Was on Arimidex since 08/22/2012 to 03/30/16 Recent treatment: Kadcyla Subcutaneous nodule excisionleft chest: Infiltrating carcinoma breast primary, ER positive, PR negative, HER-2 positive  06/04/2018:12/24/2018 andBone scan: Progression of metastatic disease involving the bone, renal lesion and lung nodule Brain MRI showing dural based metastases: Because the patient is asymptomatic we decided to not perform any procedures at this time.  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 Fatigue: intermittent and she is managing well on her own. 5. Arthralgias: managed with movement and tylenol if needed.  CT CAP and bone scan 12/24/2018: Stable bone metastases no new lesions Plan: Continue current therapy Follow-up monthly for Herceptin's and every 3 months for Xgeva. I will see her every 3 months for follow-ups.  Next scan will be done in September 2020.

## 2019-01-22 NOTE — Progress Notes (Signed)
Patient Care Team: Bridget Hartshorn, NP as PCP - General (Adult Health Nurse Practitioner)  DIAGNOSIS:    ICD-10-CM   1. Malignant neoplasm of upper-outer quadrant of left breast in female, estrogen receptor positive ([place]) C50.412    Z17.0     SUMMARY OF ONCOLOGIC HISTORY:   Breast cancer of upper-outer quadrant of left female breast ([place])   11/14/2011 Surgery    Bilateral mastectomy, prophylactic on the right, left breast IDC 3/18 lymph nodes positive with extracapsular extension ER 89%, PR 81%, HER-2 negative, Ki-67 79% T2 N1 A. stage IIB    12/13/2011 - 06/28/2012 Chemotherapy    4 cycles of FEC followed by 4 cycles of Taxotere    07/17/2012 - 08/22/2012 Radiation Therapy    Adjuvant radiation therapy    08/22/2012 - 03/16/2016 Anti-estrogen oral therapy    Arimidex 1 mg daily    03/16/2016 Relapse/Recurrence    Subcutaneous nodule excision left chest: Infiltrating carcinoma breast primary, ER positive, PR negative    03/29/2016 Imaging    CT CAP and bone scan: Lytic lesions T8 vertebral, T1 posterior element, subcutaneous nodule left lateral chest wall, nonspecific lung nodules; Bone scan: Mets to kull, left humerus, left eighth rib, T7/T8, sternum, left acetabulum    04/28/2016 - 06/17/2017 Chemotherapy    Herceptin, lapatinib, Faslodex, Zometa every 4 weeks, lapatinib discontinued in September 2018 due to elevation of LFTs    06/22/2017 Relapse/Recurrence    Surgical excision:Soft tissue mass left lateral chest wall primary breast cancer, soft tissue mass left medial chest wall breast cancer, tumor is within the dermis extending to the subcutaneous adipose tissue and involves portions of skeletal muscle    08/2017 - 02/16/2018 Chemotherapy    Faslodex with Herceptin and Perjeta along with Zometa every 4 weeks     03/02/2018 - 05/25/2018 Chemotherapy    Kadcyla     05/11/2018 Imaging    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 Associated vasogenic  edema within the underlying right cerebral hemisphere without significant midline shift. Signal abnormality throughout the visualized bone marrow, compatible with osseous metastatic disease.       06/04/2018 Imaging    CT CAP: Right lower lobe lung nodule 7 mm (was 5 mm); multiple bone metastases throughout the spine and ribs sternum scapula and humerus, slightly increased lower thoracic mets, right renal lesion 2.5 cm (was 1.2 cm) right femur met increased from 2.1 cm to 2.9 cm    06/15/2018 -  Anti-estrogen oral therapy    Abemaciclib, Herceptin, letrozole, Xgeva     Metastatic breast cancer ([place])   06/29/2017 Initial Diagnosis    Metastatic breast cancer ([place])    06/15/2018 -  Chemotherapy    The patient had trastuzumab (HERCEPTIN) 546 mg in sodium chloride 0.9 % 250 mL chemo infusion, 8 mg/kg = 546 mg, Intravenous,  Once, 8 of 12 cycles Administration: 546 mg (06/15/2018), 399 mg (09/07/2018), 399 mg (07/13/2018), 399 mg (10/12/2018), 399 mg (11/02/2018), 399 mg (11/30/2018), 399 mg (12/28/2018), 399 mg (08/10/2018)  for chemotherapy treatment.      CHIEF COMPLIANT: Follow-up on Herceptin with abemaciclib and letrozoleandXgeva  INTERVAL HISTORY: [name] Riley is a 68 y.o. with above-mentioned history of metastatic breast cancer currently on abemaciclib with letrozole along with Herceptin Xgeva.She presents to the clinic alone today for treatment.  She continues to have intermittent diarrhea which is well controlled with Imodium.  Sometimes she may have to take 2 or 3 tablets of Imodium per day.  She  has not had a bowel movement in the last 2 days.  She has noticed that she feels cold quite often.  No fevers or chills.  REVIEW OF SYSTEMS:   Constitutional: Denies fevers, chills or abnormal weight loss Eyes: Denies blurriness of vision Ears, nose, mouth, throat, and face: Denies mucositis or sore throat Respiratory: Denies cough, dyspnea or wheezes Cardiovascular: Denies palpitation,  chest discomfort Gastrointestinal: Denies nausea, heartburn or change in bowel habits Skin: Denies abnormal skin rashes Lymphatics: Denies new lymphadenopathy or easy bruising Neurological: Denies numbness, tingling or new weaknesses Behavioral/Psych: Mood is stable, no new changes  Extremities: No lower extremity edema Breast: denies any pain or lumps or nodules in either breasts All other systems were reviewed with the patient and are negative.  I have reviewed the past medical history, past surgical history, social history and family history with the patient and they are unchanged from previous note.  ALLERGIES:  is allergic to cantaloupe extract allergy skin test; contrast media [iodinated diagnostic agents]; pravastatin; and zosyn [piperacillin sod-tazobactam so].  MEDICATIONS:  Current Outpatient Medications  Medication Sig Dispense Refill  . acetaminophen (TYLENOL) 500 MG tablet Take 1,000 mg by mouth every 6 (six) hours as needed for moderate pain.    Marland Kitchen aspirin 81 MG tablet Take 81 mg by mouth daily.      . cholecalciferol (VITAMIN D) 1000 units tablet Take 1 tablet (1,000 Units total) by mouth daily.    . diphenhydrAMINE (BENADRYL) 25 MG tablet Take 50 mg by mouth at bedtime as needed for sleep.    Marland Kitchen letrozole (FEMARA) 2.5 MG tablet Take 1 tablet (2.5 mg total) by mouth daily. 90 tablet 3  . levETIRAcetam (KEPPRA) 500 MG tablet TAKE ONE TABLET BY MOUTH TWICE DAILY 60 tablet 2  . levothyroxine (SYNTHROID, LEVOTHROID) 100 MCG tablet Take 100 mcg by mouth daily before breakfast. BRAND ONLY    . lidocaine-prilocaine (EMLA) cream Apply topically as needed (for port access). Apply to affected area once 30 g 3  . metoprolol succinate (TOPROL-XL) 100 MG 24 hr tablet Take 1 tablet (100 mg total) by mouth daily. 90 tablet 3  . Multiple Vitamins-Minerals (MULTIVITAMIN WITH MINERALS) tablet Take 1 tablet by mouth daily.      . ondansetron (ZOFRAN-ODT) 4 MG disintegrating tablet Take 1 tablet  (4 mg total) by mouth every 6 (six) hours as needed for nausea or vomiting. 30 tablet 3  . predniSONE (DELTASONE) 50 MG tablet Take 1 28m tablet by mouth 13 hrs, 7hrs, and 1 hr prior to CT Scan. (CT dye allergy protocol) 3 tablet 0  . VERZENIO 150 MG tablet TAKE 1 TABLET (150 MG TOTAL) BY MOUTH 2 (TWO) TIMES DAILY. SWALLOW TABLETS WHOLE. DO NOT CHEW, CRUSH, OR SPLIT TABLETS BEFORE SWALLOWING. 56 tablet 1   No current facility-administered medications for this visit.    Facility-Administered Medications Ordered in Other Visits  Medication Dose Route Frequency Provider Last Rate Last Dose  . 0.9 %  sodium chloride infusion   Intravenous Continuous TDonnie Mesa MD      . acetaminophen (TYLENOL) tablet 975 mg  975 mg Oral Q6H TDonnie Mesa MD      . enoxaparin (LOVENOX) injection 40 mg  40 mg Subcutaneous Q24H TDonnie Mesa MD      . morphine 4 MG/ML injection 2-4 mg  2-4 mg Intravenous Q2H PRN TDonnie Mesa MD      . ondansetron (ZOFRAN-ODT) disintegrating tablet 4 mg  4 mg Oral Q6H PRN TDonnie Mesa MD  Or  . ondansetron (ZOFRAN) 4 mg in sodium chloride 0.9 % 50 mL IVPB  4 mg Intravenous Q6H PRN Donnie Mesa, MD      . oxyCODONE (Oxy IR/ROXICODONE) immediate release tablet 5-10 mg  5-10 mg Oral Q4H PRN Donnie Mesa, MD      . piperacillin-tazobactam (ZOSYN) 3.375 g in dextrose 5 % 50 mL IVPB  3.375 g Intravenous Q8H Donnie Mesa, MD      . prochlorperazine (COMPAZINE) tablet 10 mg  10 mg Oral Q6H PRN Donnie Mesa, MD       Or  . prochlorperazine (COMPAZINE) injection 5-10 mg  5-10 mg Intravenous Q6H PRN Donnie Mesa, MD        PHYSICAL EXAMINATION: ECOG PERFORMANCE STATUS: 1 - Symptomatic but completely ambulatory  Vitals:   01/24/19 1028  BP: (!) 154/81  Pulse: 64  Resp: 17  Temp: 98.2 F (36.8 C)  SpO2: 100%   Filed Weights   01/24/19 1028  Weight: 138 lb 8 oz (62.8 kg)    GENERAL: alert, no distress and comfortable SKIN: skin color, texture, turgor  are normal, no rashes or significant lesions EYES: normal, Conjunctiva are pink and non-injected, sclera clear OROPHARYNX: no exudate, no erythema and lips, buccal mucosa, and tongue normal  NECK: supple, thyroid normal size, non-tender, without nodularity LYMPH: no palpable lymphadenopathy in the cervical, axillary or inguinal LUNGS: clear to auscultation and percussion with normal breathing effort HEART: regular rate & rhythm and no murmurs and no lower extremity edema ABDOMEN: abdomen soft, non-tender and normal bowel sounds MUSCULOSKELETAL: no cyanosis of digits and no clubbing  NEURO: alert & oriented x 3 with fluent speech, no focal motor/sensory deficits EXTREMITIES: No lower extremity edema  LABORATORY DATA:  I have reviewed the data as listed CMP Latest Ref Rng & Units 12/28/2018 11/30/2018 11/02/2018  Glucose 70 - 99 mg/dL 92 120(H) 103(H)  BUN 8 - 23 mg/dL _0 Creatinine 0.44 - 1.00 mg/dL 0.98 1.05(H) 0.92  Sodium 135 - 145 mmol/L 141 140 140  Potassium 3.5 - 5.1 mmol/L 3.5 3.7 3.6  Chloride 98 - 111 mmol/L 104 103 103  CO2 22 - 32 mmol/L _1 Calcium 8.9 - 10.3 mg/dL 9.3 9.8 9.7  Total Protein 6.5 - 8.1 g/dL 6.8 6.9 7.0  Total Bilirubin 0.3 - 1.2 mg/dL 0.7 0.5 0.6  Alkaline Phos 38 - 126 U/L 119 134(H) 111  AST 15 - 41 U/L 45(H) 42(H) 32  ALT 0 - 44 U/L 47(H) 36 24    Lab Results  Component Value Date   WBC 3.4 (L) 01/24/2019   HGB 10.4 (L) 01/24/2019   HCT 31.4 (L) 01/24/2019   MCV 103.3 (H) 01/24/2019   PLT 83 (L) 01/24/2019   NEUTROABS 1.4 (L) 01/24/2019    ASSESSMENT & PLAN:  Breast cancer of upper-outer quadrant of left female breast ([facility]) Left breast invasive ductal carcinomaT2, N1, 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 Was on Arimidex since 08/22/2012 to 03/30/16 Recent treatment: Kadcyla  Subcutaneous nodule excisionleft chest: Infiltrating carcinoma breast primary, ER positive, PR negative, HER-2 positive  06/04/2018:12/24/2018 andBone scan: Progression of metastatic disease involving the bone, renal lesion and lung nodule Brain MRI showing dural based metastases: Because the patient is asymptomatic we decided to not perform any procedures at this time.  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 Fatigue: intermittent and she is managing well on her own. 5. Arthralgias: managed with movement and tylenol if needed.  CT CAP and bone scan 12/24/2018: Stable bone metastases no new lesions Plan: Continue current therapy Patient will also receive Xgeva today.  Follow-up monthly for Herceptin's and every 3 months for Xgeva. I will see her every 3 months for follow-ups.  Next scan will be done in September 2020.    No orders of the defined types were placed in this encounter.  The patient has a good understanding of the overall plan. she agrees with it. she will call with any problems that may develop before the next visit here.  Rulon Eisenmenger, MD 01/24/2019  Julious Oka Dorshimer am acting as scribe for Dr. Nicholas Lose.  I have reviewed the above documentation for accuracy and completeness, and I agree with the above.

## 2019-01-24 ENCOUNTER — Inpatient Hospital Stay: Payer: Medicare Other

## 2019-01-24 ENCOUNTER — Other Ambulatory Visit: Payer: Self-pay

## 2019-01-24 ENCOUNTER — Inpatient Hospital Stay: Payer: Medicare Other | Attending: Hematology and Oncology

## 2019-01-24 ENCOUNTER — Inpatient Hospital Stay: Payer: Medicare Other | Admitting: Hematology and Oncology

## 2019-01-24 DIAGNOSIS — I2510 Atherosclerotic heart disease of native coronary artery without angina pectoris: Secondary | ICD-10-CM | POA: Diagnosis not present

## 2019-01-24 DIAGNOSIS — Z95828 Presence of other vascular implants and grafts: Secondary | ICD-10-CM

## 2019-01-24 DIAGNOSIS — C50919 Malignant neoplasm of unspecified site of unspecified female breast: Secondary | ICD-10-CM

## 2019-01-24 DIAGNOSIS — Z801 Family history of malignant neoplasm of trachea, bronchus and lung: Secondary | ICD-10-CM | POA: Insufficient documentation

## 2019-01-24 DIAGNOSIS — C50412 Malignant neoplasm of upper-outer quadrant of left female breast: Secondary | ICD-10-CM | POA: Diagnosis not present

## 2019-01-24 DIAGNOSIS — C7951 Secondary malignant neoplasm of bone: Secondary | ICD-10-CM | POA: Insufficient documentation

## 2019-01-24 DIAGNOSIS — Z7982 Long term (current) use of aspirin: Secondary | ICD-10-CM | POA: Diagnosis not present

## 2019-01-24 DIAGNOSIS — Z79899 Other long term (current) drug therapy: Secondary | ICD-10-CM | POA: Diagnosis not present

## 2019-01-24 DIAGNOSIS — I252 Old myocardial infarction: Secondary | ICD-10-CM | POA: Insufficient documentation

## 2019-01-24 DIAGNOSIS — Z8041 Family history of malignant neoplasm of ovary: Secondary | ICD-10-CM | POA: Diagnosis not present

## 2019-01-24 DIAGNOSIS — Z79811 Long term (current) use of aromatase inhibitors: Secondary | ICD-10-CM | POA: Diagnosis not present

## 2019-01-24 DIAGNOSIS — Z8042 Family history of malignant neoplasm of prostate: Secondary | ICD-10-CM | POA: Diagnosis not present

## 2019-01-24 DIAGNOSIS — E039 Hypothyroidism, unspecified: Secondary | ICD-10-CM | POA: Insufficient documentation

## 2019-01-24 DIAGNOSIS — R232 Flushing: Secondary | ICD-10-CM | POA: Diagnosis not present

## 2019-01-24 DIAGNOSIS — Z9221 Personal history of antineoplastic chemotherapy: Secondary | ICD-10-CM | POA: Insufficient documentation

## 2019-01-24 DIAGNOSIS — Z923 Personal history of irradiation: Secondary | ICD-10-CM | POA: Insufficient documentation

## 2019-01-24 DIAGNOSIS — C7931 Secondary malignant neoplasm of brain: Secondary | ICD-10-CM | POA: Diagnosis not present

## 2019-01-24 DIAGNOSIS — R569 Unspecified convulsions: Secondary | ICD-10-CM | POA: Insufficient documentation

## 2019-01-24 DIAGNOSIS — Z170 Estrogen receptor positive status [ER+]: Secondary | ICD-10-CM | POA: Diagnosis not present

## 2019-01-24 DIAGNOSIS — E785 Hyperlipidemia, unspecified: Secondary | ICD-10-CM | POA: Diagnosis not present

## 2019-01-24 DIAGNOSIS — R918 Other nonspecific abnormal finding of lung field: Secondary | ICD-10-CM | POA: Insufficient documentation

## 2019-01-24 DIAGNOSIS — Z800 Family history of malignant neoplasm of digestive organs: Secondary | ICD-10-CM | POA: Diagnosis not present

## 2019-01-24 DIAGNOSIS — I10 Essential (primary) hypertension: Secondary | ICD-10-CM | POA: Insufficient documentation

## 2019-01-24 DIAGNOSIS — Z5112 Encounter for antineoplastic immunotherapy: Secondary | ICD-10-CM | POA: Insufficient documentation

## 2019-01-24 LAB — CBC WITH DIFFERENTIAL (CANCER CENTER ONLY)
Abs Immature Granulocytes: 0 10*3/uL (ref 0.00–0.07)
Basophils Absolute: 0.1 10*3/uL (ref 0.0–0.1)
Basophils Relative: 2 %
Eosinophils Absolute: 0.1 10*3/uL (ref 0.0–0.5)
Eosinophils Relative: 2 %
HCT: 31.4 % — ABNORMAL LOW (ref 36.0–46.0)
Hemoglobin: 10.4 g/dL — ABNORMAL LOW (ref 12.0–15.0)
Immature Granulocytes: 0 %
Lymphocytes Relative: 46 %
Lymphs Abs: 1.6 10*3/uL (ref 0.7–4.0)
MCH: 34.2 pg — ABNORMAL HIGH (ref 26.0–34.0)
MCHC: 33.1 g/dL (ref 30.0–36.0)
MCV: 103.3 fL — ABNORMAL HIGH (ref 80.0–100.0)
Monocytes Absolute: 0.2 10*3/uL (ref 0.1–1.0)
Monocytes Relative: 7 %
Neutro Abs: 1.4 10*3/uL — ABNORMAL LOW (ref 1.7–7.7)
Neutrophils Relative %: 43 %
Platelet Count: 83 10*3/uL — ABNORMAL LOW (ref 150–400)
RBC: 3.04 MIL/uL — ABNORMAL LOW (ref 3.87–5.11)
RDW: 13.7 % (ref 11.5–15.5)
WBC Count: 3.4 10*3/uL — ABNORMAL LOW (ref 4.0–10.5)
nRBC: 0 % (ref 0.0–0.2)

## 2019-01-24 LAB — CMP (CANCER CENTER ONLY)
ALT: 36 U/L (ref 0–44)
AST: 50 U/L — ABNORMAL HIGH (ref 15–41)
Albumin: 4 g/dL (ref 3.5–5.0)
Alkaline Phosphatase: 125 U/L (ref 38–126)
Anion gap: 9 (ref 5–15)
BUN: 11 mg/dL (ref 8–23)
CO2: 27 mmol/L (ref 22–32)
Calcium: 9.8 mg/dL (ref 8.9–10.3)
Chloride: 104 mmol/L (ref 98–111)
Creatinine: 0.84 mg/dL (ref 0.44–1.00)
GFR, Est AFR Am: >60 mL/min (ref >60)
GFR, Estimated: >60 mL/min (ref >60)
Glucose, Bld: 90 mg/dL (ref 70–99)
Potassium: 3.7 mmol/L (ref 3.5–5.1)
Sodium: 140 mmol/L (ref 135–145)
Total Bilirubin: 0.5 mg/dL (ref 0.3–1.2)
Total Protein: 7.1 g/dL (ref 6.5–8.1)

## 2019-01-24 MED ORDER — ACETAMINOPHEN 325 MG PO TABS
650.0000 mg | ORAL_TABLET | Freq: Once | ORAL | Status: AC
  Administered 2019-01-24: 11:00:00 650 mg via ORAL

## 2019-01-24 MED ORDER — DENOSUMAB 120 MG/1.7ML ~~LOC~~ SOLN
120.0000 mg | Freq: Once | SUBCUTANEOUS | Status: AC
  Administered 2019-01-24: 120 mg via SUBCUTANEOUS

## 2019-01-24 MED ORDER — TRASTUZUMAB CHEMO 150 MG IV SOLR
6.0000 mg/kg | Freq: Once | INTRAVENOUS | Status: AC
  Administered 2019-01-24: 399 mg via INTRAVENOUS
  Filled 2019-01-24: qty 19

## 2019-01-24 MED ORDER — HEPARIN SOD (PORK) LOCK FLUSH 100 UNIT/ML IV SOLN
500.0000 [IU] | Freq: Once | INTRAVENOUS | Status: AC | PRN
  Administered 2019-01-24: 500 [IU]
  Filled 2019-01-24: qty 5

## 2019-01-24 MED ORDER — DIPHENHYDRAMINE HCL 25 MG PO CAPS
ORAL_CAPSULE | ORAL | Status: AC
  Filled 2019-01-24: qty 1

## 2019-01-24 MED ORDER — ACETAMINOPHEN 325 MG PO TABS
ORAL_TABLET | ORAL | Status: AC
  Filled 2019-01-24: qty 2

## 2019-01-24 MED ORDER — SODIUM CHLORIDE 0.9 % IV SOLN
Freq: Once | INTRAVENOUS | Status: AC
  Administered 2019-01-24: 11:00:00 via INTRAVENOUS
  Filled 2019-01-24: qty 250

## 2019-01-24 MED ORDER — SODIUM CHLORIDE 0.9% FLUSH
10.0000 mL | Freq: Once | INTRAVENOUS | Status: AC
  Administered 2019-01-24: 10:00:00 10 mL
  Filled 2019-01-24: qty 10

## 2019-01-24 MED ORDER — SODIUM CHLORIDE 0.9% FLUSH
10.0000 mL | INTRAVENOUS | Status: DC | PRN
  Administered 2019-01-24: 10 mL
  Filled 2019-01-24: qty 10

## 2019-01-24 MED ORDER — DENOSUMAB 120 MG/1.7ML ~~LOC~~ SOLN
SUBCUTANEOUS | Status: AC
  Filled 2019-01-24: qty 1.7

## 2019-01-24 MED ORDER — DIPHENHYDRAMINE HCL 25 MG PO CAPS
50.0000 mg | ORAL_CAPSULE | Freq: Once | ORAL | Status: AC
  Administered 2019-01-24: 50 mg via ORAL

## 2019-01-24 NOTE — Patient Instructions (Signed)
[facility] Discharge Instructions for Patients Receiving Chemotherapy  Today you received the following chemotherapy agents :  Herceptin.  To help prevent nausea and vomiting after your treatment, we encourage you to take your nausea medication as prescribed.   If you develop nausea and vomiting that is not controlled by your nausea medication, call the clinic.   BELOW ARE SYMPTOMS THAT SHOULD BE REPORTED IMMEDIATELY:  *FEVER GREATER THAN 100.5 F  *CHILLS WITH OR WITHOUT FEVER  NAUSEA AND VOMITING THAT IS NOT CONTROLLED WITH YOUR NAUSEA MEDICATION  *UNUSUAL SHORTNESS OF BREATH  *UNUSUAL BRUISING OR BLEEDING  TENDERNESS IN MOUTH AND THROAT WITH OR WITHOUT PRESENCE OF ULCERS  *URINARY PROBLEMS  *BOWEL PROBLEMS  UNUSUAL RASH Items with * indicate a potential emergency and should be followed up as soon as possible.  Feel free to call the clinic should you have any questions or concerns. The clinic phone number is [phone].  Please show the [facility] at check-in to the Emergency Department and triage nurse.  Denosumab injection What is this medicine? DENOSUMAB (den oh sue mab) slows bone breakdown. Prolia is used to treat osteoporosis in women after menopause and in men, and in people who are taking corticosteroids for 6 months or more. Delton See is used to treat a high calcium level due to cancer and to prevent bone fractures and other bone problems caused by multiple myeloma or cancer bone metastases. Delton See is also used to treat giant cell tumor of the bone. This medicine may be used for other purposes; ask your health care provider or pharmacist if you have questions. COMMON BRAND NAME(S): Prolia, XGEVA What should I tell my health care provider before I take this medicine? They need to know if you have any of these conditions: -dental disease -having surgery or tooth extraction -infection -kidney disease -low levels of calcium or Vitamin D in the  blood -malnutrition -on hemodialysis -skin conditions or sensitivity -thyroid or parathyroid disease -an unusual reaction to denosumab, other medicines, foods, dyes, or preservatives -pregnant or trying to get pregnant -breast-feeding How should I use this medicine? This medicine is for injection under the skin. It is given by a health care professional in a hospital or clinic setting. A special MedGuide will be given to you before each treatment. Be sure to read this information carefully each time. For Prolia, talk to your pediatrician regarding the use of this medicine in children. Special care may be needed. For Delton See, talk to your pediatrician regarding the use of this medicine in children. While this drug may be prescribed for children as young as 13 years for selected conditions, precautions do apply. Overdosage: If you think you have taken too much of this medicine contact a poison control center or emergency room at once. NOTE: This medicine is only for you. Do not share this medicine with others. What if I miss a dose? It is important not to miss your dose. Call your doctor or health care professional if you are unable to keep an appointment. What may interact with this medicine? Do not take this medicine with any of the following medications: -other medicines containing denosumab This medicine may also interact with the following medications: -medicines that lower your chance of fighting infection -steroid medicines like prednisone or cortisone This list may not describe all possible interactions. Give your health care provider a list of all the medicines, herbs, non-prescription drugs, or dietary supplements you use. Also tell them if you smoke, drink  alcohol, or use illegal drugs. Some items may interact with your medicine. What should I watch for while using this medicine? Visit your doctor or health care professional for regular checks on your progress. Your doctor or health  care professional may order blood tests and other tests to see how you are doing. Call your doctor or health care professional for advice if you get a fever, chills or sore throat, or other symptoms of a cold or flu. Do not treat yourself. This drug may decrease your body's ability to fight infection. Try to avoid being around people who are sick. You should make sure you get enough calcium and vitamin D while you are taking this medicine, unless your doctor tells you not to. Discuss the foods you eat and the vitamins you take with your health care professional. See your dentist regularly. Brush and floss your teeth as directed. Before you have any dental work done, tell your dentist you are receiving this medicine. Do not become pregnant while taking this medicine or for 5 months after stopping it. Talk with your doctor or health care professional about your birth control options while taking this medicine. Women should inform their doctor if they wish to become pregnant or think they might be pregnant. There is a potential for serious side effects to an unborn child. Talk to your health care professional or pharmacist for more information. What side effects may I notice from receiving this medicine? Side effects that you should report to your doctor or health care professional as soon as possible: -allergic reactions like skin rash, itching or hives, swelling of the face, lips, or tongue -bone pain -breathing problems -dizziness -jaw pain, especially after dental work -redness, blistering, peeling of the skin -signs and symptoms of infection like fever or chills; cough; sore throat; pain or trouble passing urine -signs of low calcium like fast heartbeat, muscle cramps or muscle pain; pain, tingling, numbness in the hands or feet; seizures -unusual bleeding or bruising -unusually weak or tired Side effects that usually do not require medical attention (report to your doctor or health care  professional if they continue or are bothersome): -constipation -diarrhea -headache -joint pain -loss of appetite -muscle pain -runny nose -tiredness -upset stomach This list may not describe all possible side effects. Call your doctor for medical advice about side effects. You may report side effects to FDA at 1-800-FDA-1088. Where should I keep my medicine? This medicine is only given in a clinic, doctor's office, or other health care setting and will not be stored at home. NOTE: This sheet is a summary. It may not cover all possible information. If you have questions about this medicine, talk to your doctor, pharmacist, or health care provider.  2019 Elsevier/Gold Standard (2018-02-09 16:10:44) This information is directly available on the CDC website: RunningShows.co.za.html    Source:CDC Reference to specific commercial products, manufacturers, companies, or trademarks does not constitute its endorsement or recommendation by the [place], [place], or Centers for [employer] and Prevention.

## 2019-01-25 ENCOUNTER — Other Ambulatory Visit: Payer: Medicare Other

## 2019-01-25 ENCOUNTER — Other Ambulatory Visit: Payer: Self-pay | Admitting: Radiation Therapy

## 2019-01-25 ENCOUNTER — Ambulatory Visit: Payer: Medicare Other

## 2019-01-25 ENCOUNTER — Ambulatory Visit: Payer: Medicare Other | Admitting: Adult Health

## 2019-01-25 MED ORDER — PEGFILGRASTIM-CBQV 6 MG/0.6ML ~~LOC~~ SOSY
PREFILLED_SYRINGE | SUBCUTANEOUS | Status: AC
  Filled 2019-01-25: qty 0.6

## 2019-02-01 ENCOUNTER — Other Ambulatory Visit: Payer: Self-pay

## 2019-02-01 ENCOUNTER — Ambulatory Visit (HOSPITAL_COMMUNITY)
Admission: RE | Admit: 2019-02-01 | Discharge: 2019-02-01 | Disposition: A | Discharge status: Home / Self Care | Payer: Medicare Other | Source: Ambulatory Visit | Attending: Internal Medicine | Admitting: Internal Medicine

## 2019-02-01 DIAGNOSIS — C7931 Secondary malignant neoplasm of brain: Secondary | ICD-10-CM | POA: Insufficient documentation

## 2019-02-01 MED ORDER — GADOBUTROL 1 MMOL/ML IV SOLN
6.0000 mL | Freq: Once | INTRAVENOUS | Status: AC | PRN
  Administered 2019-02-01: 6 mL via INTRAVENOUS

## 2019-02-01 MED ORDER — SODIUM CHLORIDE 0.9% FLUSH: 10.0000 mL | INTRAVENOUS | Status: DC | PRN

## 2019-02-01 MED ORDER — HEPARIN SOD (PORK) LOCK FLUSH 100 UNIT/ML IV SOLN
500.0000 [IU] | INTRAVENOUS | Status: AC | PRN
  Administered 2019-02-01: 500 [IU]

## 2019-02-04 ENCOUNTER — Inpatient Hospital Stay: Payer: Medicare Other

## 2019-02-07 ENCOUNTER — Ambulatory Visit: Payer: Medicare Other | Admitting: Internal Medicine

## 2019-02-08 ENCOUNTER — Telehealth: Payer: Self-pay | Admitting: Internal Medicine

## 2019-02-08 ENCOUNTER — Inpatient Hospital Stay (HOSPITAL_BASED_OUTPATIENT_CLINIC_OR_DEPARTMENT_OTHER): Payer: Medicare Other | Admitting: Internal Medicine

## 2019-02-08 ENCOUNTER — Other Ambulatory Visit: Payer: Self-pay

## 2019-02-08 VITALS — BP 172/87 | HR 76 | Temp 98.2°F | Resp 18 | Ht 68.0 in | Wt 140.3 lb

## 2019-02-08 DIAGNOSIS — E785 Hyperlipidemia, unspecified: Secondary | ICD-10-CM

## 2019-02-08 DIAGNOSIS — C7951 Secondary malignant neoplasm of bone: Secondary | ICD-10-CM

## 2019-02-08 DIAGNOSIS — Z5112 Encounter for antineoplastic immunotherapy: Secondary | ICD-10-CM | POA: Diagnosis not present

## 2019-02-08 DIAGNOSIS — Z801 Family history of malignant neoplasm of trachea, bronchus and lung: Secondary | ICD-10-CM

## 2019-02-08 DIAGNOSIS — Z8041 Family history of malignant neoplasm of ovary: Secondary | ICD-10-CM

## 2019-02-08 DIAGNOSIS — I2510 Atherosclerotic heart disease of native coronary artery without angina pectoris: Secondary | ICD-10-CM

## 2019-02-08 DIAGNOSIS — C50412 Malignant neoplasm of upper-outer quadrant of left female breast: Secondary | ICD-10-CM

## 2019-02-08 DIAGNOSIS — Z800 Family history of malignant neoplasm of digestive organs: Secondary | ICD-10-CM

## 2019-02-08 DIAGNOSIS — C7931 Secondary malignant neoplasm of brain: Secondary | ICD-10-CM | POA: Diagnosis not present

## 2019-02-08 DIAGNOSIS — Z923 Personal history of irradiation: Secondary | ICD-10-CM

## 2019-02-08 DIAGNOSIS — E039 Hypothyroidism, unspecified: Secondary | ICD-10-CM

## 2019-02-08 DIAGNOSIS — Z170 Estrogen receptor positive status [ER+]: Secondary | ICD-10-CM

## 2019-02-08 DIAGNOSIS — Z79899 Other long term (current) drug therapy: Secondary | ICD-10-CM

## 2019-02-08 DIAGNOSIS — R569 Unspecified convulsions: Secondary | ICD-10-CM

## 2019-02-08 DIAGNOSIS — I252 Old myocardial infarction: Secondary | ICD-10-CM

## 2019-02-08 DIAGNOSIS — I10 Essential (primary) hypertension: Secondary | ICD-10-CM

## 2019-02-08 DIAGNOSIS — R232 Flushing: Secondary | ICD-10-CM

## 2019-02-08 DIAGNOSIS — Z9221 Personal history of antineoplastic chemotherapy: Secondary | ICD-10-CM

## 2019-02-08 DIAGNOSIS — Z79811 Long term (current) use of aromatase inhibitors: Secondary | ICD-10-CM

## 2019-02-08 DIAGNOSIS — Z7982 Long term (current) use of aspirin: Secondary | ICD-10-CM

## 2019-02-08 DIAGNOSIS — Z8042 Family history of malignant neoplasm of prostate: Secondary | ICD-10-CM

## 2019-02-08 DIAGNOSIS — R918 Other nonspecific abnormal finding of lung field: Secondary | ICD-10-CM

## 2019-02-08 NOTE — Progress Notes (Signed)
[facility] [place], [place] [address] [phone]   Interval Evaluation  Date of Service: 02/08/19 Patient Name: [name] Riley Patient MRN: [id number] Patient DOB: January 29, 1951 Provider: Ventura Sellers, MD  Identifying Statement:  [name] Riley is a 68 y.o. female with seizures, weakness   Primary Cancer:  Oncologic History:   Breast cancer of upper-outer quadrant of left female breast ([place])   11/14/2011 Surgery    Bilateral mastectomy, prophylactic on the right, left breast IDC 3/18 lymph nodes positive with extracapsular extension ER 89%, PR 81%, HER-2 negative, Ki-67 79% T2 N1 A. stage IIB    12/13/2011 - 06/28/2012 Chemotherapy    4 cycles of FEC followed by 4 cycles of Taxotere    07/17/2012 - 08/22/2012 Radiation Therapy    Adjuvant radiation therapy    08/22/2012 - 03/16/2016 Anti-estrogen oral therapy    Arimidex 1 mg daily    03/16/2016 Relapse/Recurrence    Subcutaneous nodule excision left chest: Infiltrating carcinoma breast primary, ER positive, PR negative    03/29/2016 Imaging    CT CAP and bone scan: Lytic lesions T8 vertebral, T1 posterior element, subcutaneous nodule left lateral chest wall, nonspecific lung nodules; Bone scan: Mets to kull, left humerus, left eighth rib, T7/T8, sternum, left acetabulum    04/28/2016 - 06/17/2017 Chemotherapy    Herceptin, lapatinib, Faslodex, Zometa every 4 weeks, lapatinib discontinued in September 2018 due to elevation of LFTs    06/22/2017 Relapse/Recurrence    Surgical excision:Soft tissue mass left lateral chest wall primary breast cancer, soft tissue mass left medial chest wall breast cancer, tumor is within the dermis extending to the subcutaneous adipose tissue and involves portions of skeletal muscle    08/2017 - 02/16/2018 Chemotherapy    Faslodex with Herceptin and Perjeta along with Zometa every 4 weeks     03/02/2018 - 05/25/2018 Chemotherapy    Kadcyla     05/11/2018 Imaging    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 Associated vasogenic edema within the underlying right cerebral hemisphere without significant midline shift. Signal abnormality throughout the visualized bone marrow, compatible with osseous metastatic disease.       06/04/2018 Imaging    CT CAP: Right lower lobe lung nodule 7 mm (was 5 mm); multiple bone metastases throughout the spine and ribs sternum scapula and humerus, slightly increased lower thoracic mets, right renal lesion 2.5 cm (was 1.2 cm) right femur met increased from 2.1 cm to 2.9 cm    06/15/2018 -  Anti-estrogen oral therapy    Abemaciclib, Herceptin, letrozole, Xgeva     Metastatic breast cancer ([place])   06/29/2017 Initial Diagnosis    Metastatic breast cancer ([place])    06/15/2018 -  Chemotherapy    The patient had trastuzumab (HERCEPTIN) 546 mg in sodium chloride 0.9 % 250 mL chemo infusion, 8 mg/kg = 546 mg, Intravenous,  Once, 9 of 12 cycles Administration: 546 mg (06/15/2018), 399 mg (09/07/2018), 399 mg (07/13/2018), 399 mg (10/12/2018), 399 mg (11/02/2018), 399 mg (11/30/2018), 399 mg (01/24/2019), 399 mg (12/28/2018), 399 mg (08/10/2018)  for chemotherapy treatment.      Interval History:  [name] Riley presents today after recent MRI brain.  She describes gradual improvement in left hand funtion, continuing to work on rehab with the hand.  She has not experienced any further seizures.  Denies headaches.  Medications: Current Outpatient Medications on File Prior to Visit  Medication Sig Dispense Refill  . acetaminophen (  TYLENOL) 500 MG tablet Take 1,000 mg by mouth every 6 (six) hours as needed for moderate pain.    Marland Kitchen aspirin 81 MG tablet Take 81 mg by mouth daily.      . cholecalciferol (VITAMIN D) 1000 units tablet Take 1 tablet (1,000 Units total) by mouth daily.    Marland Kitchen letrozole (FEMARA) 2.5 MG tablet Take 1 tablet (2.5 mg total) by mouth daily. 90 tablet 3  . levETIRAcetam  (KEPPRA) 500 MG tablet TAKE ONE TABLET BY MOUTH TWICE DAILY 60 tablet 2  . levothyroxine (SYNTHROID, LEVOTHROID) 100 MCG tablet Take 100 mcg by mouth daily before breakfast. BRAND ONLY    . metoprolol succinate (TOPROL-XL) 100 MG 24 hr tablet Take 1 tablet (100 mg total) by mouth daily. 90 tablet 3  . Multiple Vitamins-Minerals (MULTIVITAMIN WITH MINERALS) tablet Take 1 tablet by mouth daily.      Marland Kitchen VERZENIO 150 MG tablet TAKE 1 TABLET (150 MG TOTAL) BY MOUTH 2 (TWO) TIMES DAILY. SWALLOW TABLETS WHOLE. DO NOT CHEW, CRUSH, OR SPLIT TABLETS BEFORE SWALLOWING. 56 tablet 1  . diphenhydrAMINE (BENADRYL) 25 MG tablet Take 50 mg by mouth at bedtime as needed for sleep.    Marland Kitchen lidocaine-prilocaine (EMLA) cream Apply topically as needed (for port access). Apply to affected area once (Patient not taking: Reported on 02/08/2019) 30 g 3  . ondansetron (ZOFRAN-ODT) 4 MG disintegrating tablet Take 1 tablet (4 mg total) by mouth every 6 (six) hours as needed for nausea or vomiting. (Patient not taking: Reported on 02/08/2019) 30 tablet 3  . predniSONE (DELTASONE) 50 MG tablet Take 1 55m tablet by mouth 13 hrs, 7hrs, and 1 hr prior to CT Scan. (CT dye allergy protocol) (Patient not taking: Reported on 02/08/2019) 3 tablet 0   Current Facility-Administered Medications on File Prior to Visit  Medication Dose Route Frequency Provider Last Rate Last Dose  . 0.9 %  sodium chloride infusion   Intravenous Continuous TDonnie Mesa MD      . acetaminophen (TYLENOL) tablet 975 mg  975 mg Oral Q6H TDonnie Mesa MD      . enoxaparin (LOVENOX) injection 40 mg  40 mg Subcutaneous Q24H TDonnie Mesa MD      . morphine 4 MG/ML injection 2-4 mg  2-4 mg Intravenous Q2H PRN TDonnie Mesa MD      . ondansetron (ZOFRAN-ODT) disintegrating tablet 4 mg  4 mg Oral Q6H PRN TDonnie Mesa MD       Or  . ondansetron (ZOFRAN) 4 mg in sodium chloride 0.9 % 50 mL IVPB  4 mg Intravenous Q6H PRN TDonnie Mesa MD      . oxyCODONE (Oxy  IR/ROXICODONE) immediate release tablet 5-10 mg  5-10 mg Oral Q4H PRN TDonnie Mesa MD      . piperacillin-tazobactam (ZOSYN) 3.375 g in dextrose 5 % 50 mL IVPB  3.375 g Intravenous Q8H TDonnie Mesa MD      . prochlorperazine (COMPAZINE) tablet 10 mg  10 mg Oral Q6H PRN TDonnie Mesa MD       Or  . prochlorperazine (COMPAZINE) injection 5-10 mg  5-10 mg Intravenous Q6H PRN TDonnie Mesa MD        Allergies:  Allergies  Allergen Reactions  . Cantaloupe Extract Allergy Skin Test Shortness Of Breath  . Contrast Media [Iodinated Diagnostic Agents] Shortness Of Breath and Rash  . Pravastatin Other (See Comments)    Legs hurt  . Zosyn [Piperacillin Sod-Tazobactam So] Rash and Other (See Comments)    Temperature  increase, facial flushing   Past Medical History:  Past Medical History:  Diagnosis Date  . Allergy   . Breast cancer ([place]) 08/25/2011   L , invasive ductal carcinoma, ER/PR +,HER2 -  . Cancer [facility])    left breast cancer  . Coronary artery disease 2001  . Heart attack [facility]) 09/2000   Sep 25, 2000  --no intervention  . History of chemotherapy comp. 08/22/2012   4 cycles of FEC and $ cycles of Taxotere  . Hyperlipidemia   . Hypertension   . Hypothyroidism   . PONV (postoperative nausea and vomiting)    gets sick from anesthesia  . Status post radiation therapy 07/09/12 - 08/22/2012   Left Breast, 60.4 gray   Past Surgical History:  Past Surgical History:  Procedure Laterality Date  . ABDOMINAL HYSTERECTOMY  1998   TAH, oophorectomy  . APPENDECTOMY  1970  . BREAST SURGERY  1998   removal of benign lump in rt breast  . BREAST SURGERY  11/14/11   right simple mastectomy, left mrm  . INCISION AND DRAINAGE OF WOUND Left 07/01/2017   Procedure: IRRIGATION AND DEBRIDEMENT CHEST WALL ABSCESS;  Surgeon: Donnie Mesa, MD;  Location: [facility];  Service: General;  Laterality: Left;  Marland Kitchen MASS EXCISION Left 06/22/2017   Procedure: EXCISION OF CHEST WALL MASSES;  Surgeon: Donnie Mesa, MD;  Location: [place];  Service: General;  Laterality: Left;  Marland Kitchen MASTECTOMY Bilateral    for left breast cancer  . OVARIAN CYST SURGERY Right 1970  . PORT-A-CATH REMOVAL  08/30/2012   Procedure: REMOVAL PORT-A-CATH;  Surgeon: Imogene Burn. [name], MD;  Location: [place];  Service: General;  Laterality: Right;  port removal  . PORTACATH PLACEMENT  11/14/2011   Procedure: INSERTION PORT-A-CATH;  Surgeon: Imogene Burn. [name], MD;  Location: [place];  Service: General;  Laterality: Right;  . PORTACATH PLACEMENT N/A 04/25/2016   Procedure: INSERTION PORT-A-CATH LEFT CHEST;  Surgeon: Donnie Mesa, MD;  Location: [place];  Service: General;  Laterality: N/A;  . skin tags  05/09/1997   left axillary left neck skin tags  . TONSILLECTOMY  1968   Social History:  Social History   Socioeconomic History  . Marital status: Married    Spouse name: Not on file  . Number of children: 3  . Years of education: Not on file  . Highest education level: Not on file  Occupational History  . Occupation: Works at [employer]  . Financial resource strain: Not on file  . Food insecurity:    Worry: Not on file    Inability: Not on file  . Transportation needs:    Medical: Not on file    Non-medical: Not on file  Tobacco Use  . Smoking status: Never Smoker  . Smokeless tobacco: Never Used  Substance and Sexual Activity  . Alcohol use: No  . Drug use: No  . Sexual activity: Yes    Birth control/protection: Surgical    Comment: menarche 65, Parity age 32, G68, P3, 1 miscarriage,  HRT x 5-10 yrs, Mild Hot Flashes  Lifestyle  . Physical activity:    Days per week: Not on file    Minutes per session: Not on file  . Stress: Not on file  Relationships  . Social connections:    Talks on phone: Not on file    Gets together: Not on file    Attends religious service: Not on file  Active member of club or organization: Not on file    Attends meetings of  clubs or organizations: Not on file    Relationship status: Not on file  . Intimate partner violence:    Fear of current or ex partner: Not on file    Emotionally abused: Not on file    Physically abused: Not on file    Forced sexual activity: Not on file  Other Topics Concern  . Not on file  Social History Narrative   Lives at home.   Family History:  Family History  Problem Relation Age of Onset  . Hypertension Maternal Grandmother   . Diabetes Maternal Grandmother   . Cancer Father 68       lung cancer and Prostate Cancer  . Hypertension Mother   . Cancer Paternal Aunt        ovarian  . Cancer Cousin        breast, paternal cousin  . Cancer Paternal Uncle        stomach  . Cancer Paternal Grandfather        Esophagus  . Colon cancer Neg Hx     Review of Systems: Constitutional: Denies fevers, chills or abnormal weight loss Eyes: Denies blurriness of vision Ears, nose, mouth, throat, and face: Denies mucositis or sore throat Respiratory: Denies cough, dyspnea or wheezes Cardiovascular: Denies palpitation, chest discomfort or lower extremity swelling Gastrointestinal:  Denies nausea, constipation, diarrhea GU: Denies dysuria or incontinence Skin: Denies abnormal skin rashes Neurological: Per HPI Musculoskeletal: Denies joint pain, back or neck discomfort. No decrease in ROM Behavioral/Psych: Denies anxiety, disturbance in thought content, and mood instability   Physical Exam: Vitals:   02/08/19 1013  BP: (!) 172/87  Pulse: 76  Resp: 18  Temp: 98.2 F (36.8 C)  SpO2: 100%   KPS: 90. General: Alert, cooperative, pleasant, in no acute distress Head: Normal EENT: No conjunctival injection or scleral icterus. Oral mucosa moist Lungs: Resp effort normal Cardiac: Regular rate and rhythm Abdomen: Soft, non-distended abdomen Skin: No rashes cyanosis or petechiae. Extremities: No clubbing or edema  Neurologic Exam: Mental Status: Awake, alert, attentive to  examiner. Oriented to self and environment. Language is fluent with intact comprehension.  Cranial Nerves: Visual acuity is grossly normal. Visual fields are full. Extra-ocular movements intact. No ptosis. Face is symmetric, tongue midline. Motor: Tone and bulk are normal. Slight incoordination with fine motor function in left hand. Reflexes are symmetric, no pathologic reflexes present. Intact finger to nose bilaterally Sensory: Normal Gait: Normal and tandem gait is normal.   Labs: I have reviewed the data as listed    Component Value Date/Time   NA 140 01/24/2019 1018   NA 140 09/22/2017 0958   K 3.7 01/24/2019 1018   K 3.7 09/22/2017 0958   CL 104 01/24/2019 1018   CL 105 08/10/2012 0908   CO2 27 01/24/2019 1018   CO2 26 09/22/2017 0958   GLUCOSE 90 01/24/2019 1018   GLUCOSE 80 09/22/2017 0958   GLUCOSE 81 08/10/2012 0908   BUN 11 01/24/2019 1018   BUN 12.8 09/22/2017 0958   CREATININE 0.84 01/24/2019 1018   CREATININE 0.6 09/22/2017 0958   CALCIUM 9.8 01/24/2019 1018   CALCIUM 9.1 09/22/2017 0958   PROT 7.1 01/24/2019 1018   PROT 7.0 09/22/2017 0958   ALBUMIN 4.0 01/24/2019 1018   ALBUMIN 4.0 09/22/2017 0958   AST 50 (H) 01/24/2019 1018   AST 37 (H) 09/22/2017 0958   ALT 36 01/24/2019 1018  ALT 31 09/22/2017 0958   ALKPHOS 125 01/24/2019 1018   ALKPHOS 140 09/22/2017 0958   BILITOT 0.5 01/24/2019 1018   BILITOT 0.41 09/22/2017 0958   GFRNONAA >60 01/24/2019 1018   GFRAA >60 01/24/2019 1018   Lab Results  Component Value Date   WBC 3.4 (L) 01/24/2019   NEUTROABS 1.4 (L) 01/24/2019   HGB 10.4 (L) 01/24/2019   HCT 31.4 (L) 01/24/2019   MCV 103.3 (H) 01/24/2019   PLT 83 (L) 01/24/2019    Imaging:  Imaging:  [facility] Clinician Interpretation: I have personally reviewed the CNS images as listed.  My interpretation, in the context of the patient's clinical presentation, is stable disease  Mr Jeri Cos Wo Contrast  Result Date: 02/01/2019 CLINICAL DATA:   Metastatic breast cancer follow-up EXAM: MRI HEAD WITHOUT AND WITH CONTRAST TECHNIQUE: Multiplanar, multiecho pulse sequences of the brain and surrounding structures were obtained without and with intravenous contrast. CONTRAST:  6 mL Gadovist IV COMPARISON:  MRI head 11/07/2018 FINDINGS: Brain: Enhancing masses along the right dural convexity appear stable and measure approximately 7 mm in thickness each. Associated diffuse dural thickening is also present overlying the right cerebral hemisphere. White matter edema underlying the dural metastatic disease is stable. Overlying extensive bony metastatic disease in the calvarium. Ventricle size normal. No midline shift. No other enhancing metastatic deposits in the brain. No acute infarct or hemorrhage. Vascular: Normal arterial flow voids Skull and upper cervical spine: Extensive bony metastatic disease throughout the calvarium right greater than left. Extensive metastatic disease in the cervical spine as well as the inferior clivus appears similar to the prior study. Lesion also in the angle of the mandible on the right. Sinuses/Orbits: Mild mucosal edema paranasal sinuses. Negative orbit Other: None IMPRESSION: Stable dural metastatic disease over the right convexity. Underlying white matter edema stable. Widespread bony metastatic disease stable. No new findings. Electronically Signed   By: Franchot Gallo M.D.   On: 02/01/2019 15:19      Assessment/Plan 1. Brain metastasis [facility])  Ms. Tackett is clinically and radiographically stable today.  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  We recommend she again repeat MRI brain in 4 months and return to clinic for evaluation at that time.    We appreciate the opportunity to participate in the care of Appolonia Ackert Scrogham.    All questions were answered. The patient knows to call the clinic with any problems, questions or concerns. No  barriers to learning were detected.  The total time spent in the encounter was 25 minutes and more than 50% was on counseling and review of test results   Ventura Sellers, MD Medical Director of Neuro-Oncology [facility] [place] 02/08/19 10:35 AM

## 2019-02-08 NOTE — Telephone Encounter (Signed)
Scheduled appt per 4/24 los. ° °A calendar will be mailed out. °

## 2019-02-11 IMAGING — CT CT CHEST W/ CM
2 of 5 series · 12 of 36 positions shown, 15 images · IV contrast (OMNIPAQUE)
Comparison: 02/05/2018 CT, bone scan and prior studies

CLINICAL DATA: 67-year-old female for restaging of metastatic
breast cancer diagnosed in 7177 with recurrence and bone metastases
in 0353. Currently undergoing chemotherapy. History of bilateral
mastectomies.

EXAM:
CT CHEST, ABDOMEN, AND PELVIS WITH CONTRAST
TECHNIQUE: Multidetector CT imaging of the chest, abdomen and pelvis was
performed following the standard protocol during bolus
administration of intravenous contrast.
CONTRAST:  100mL OMNIPAQUE IOHEXOL 300 MG/ML  SOLN

[Series 2: cap with · axial · 0.79mm/px · z∈[-605,-125]mm · 9 of 122 slices shown, 12 images]
[im 13/122  mediastinal]
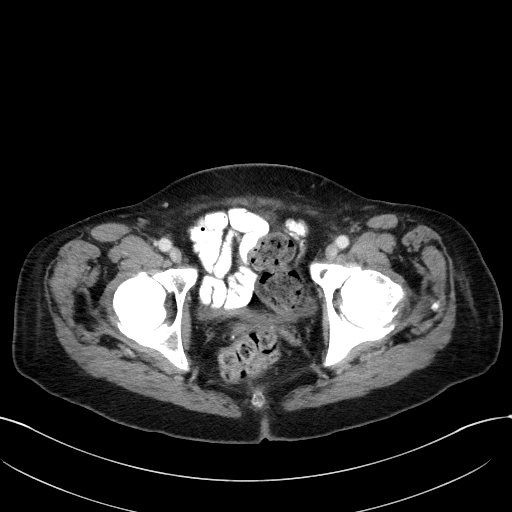
[im 13/122  lung]
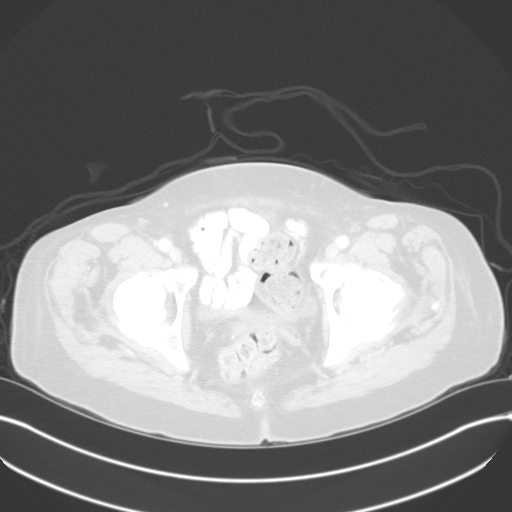
[im 25/122  lung]
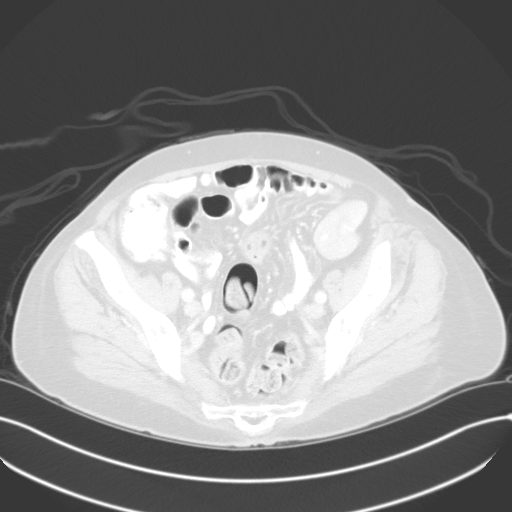
[im 37/122  lung]
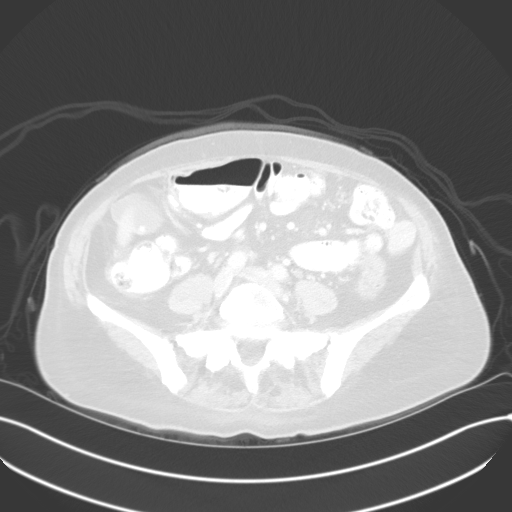
[im 49/122  lung]
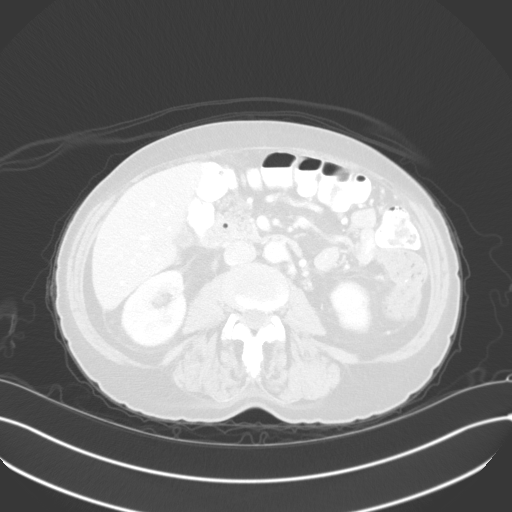
[im 61/122  mediastinal]
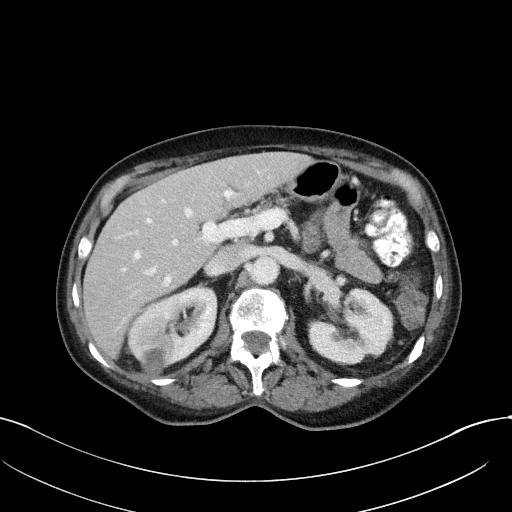
[im 61/122  lung]
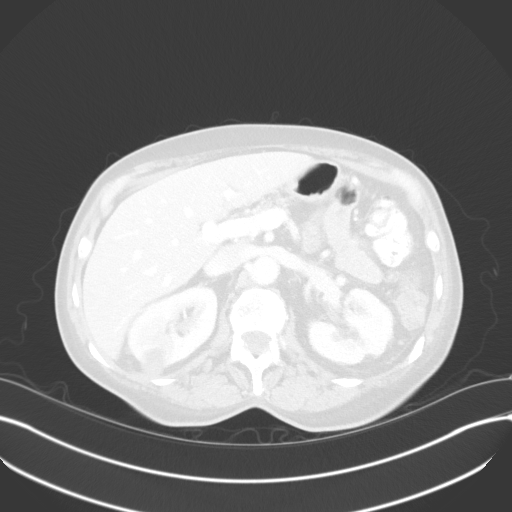
[im 73/122  lung]
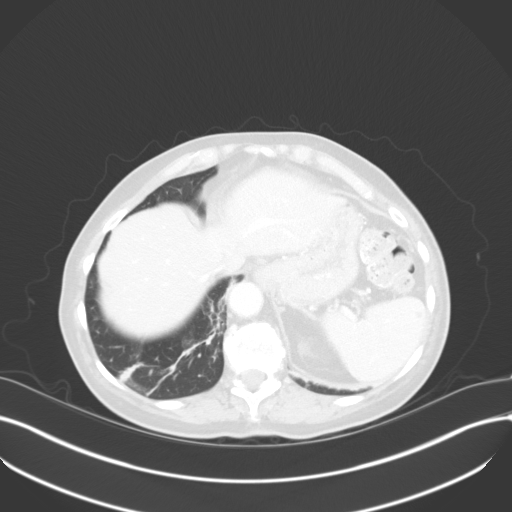
[im 85/122  lung]
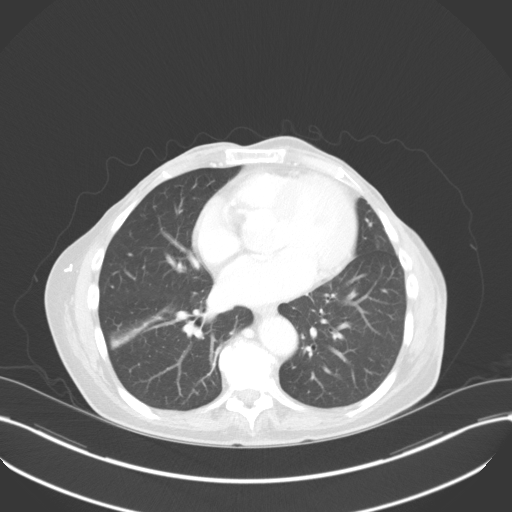
[im 97/122  lung]
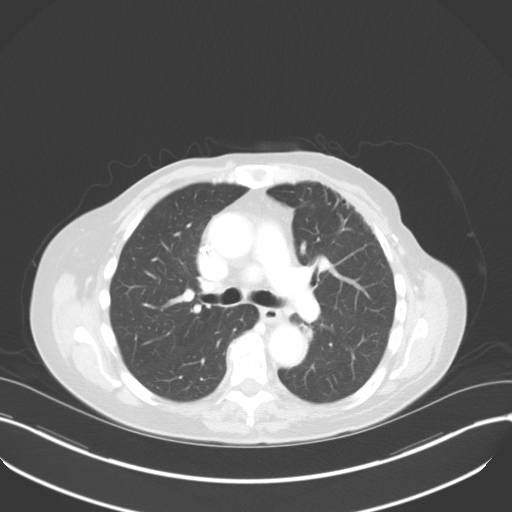
[im 109/122  mediastinal]
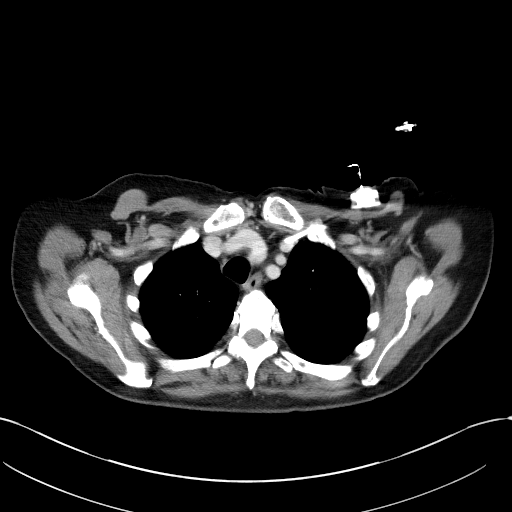
[im 109/122  lung]
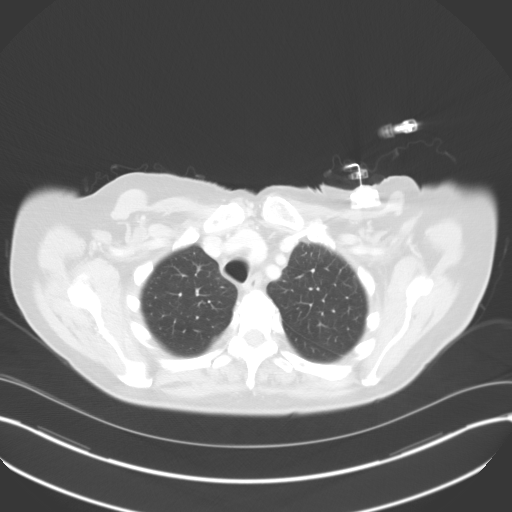

[Series 4: coronals · coronal · 0.82mm/px · 3 of 139 slices shown]
[im 28/139  lung]
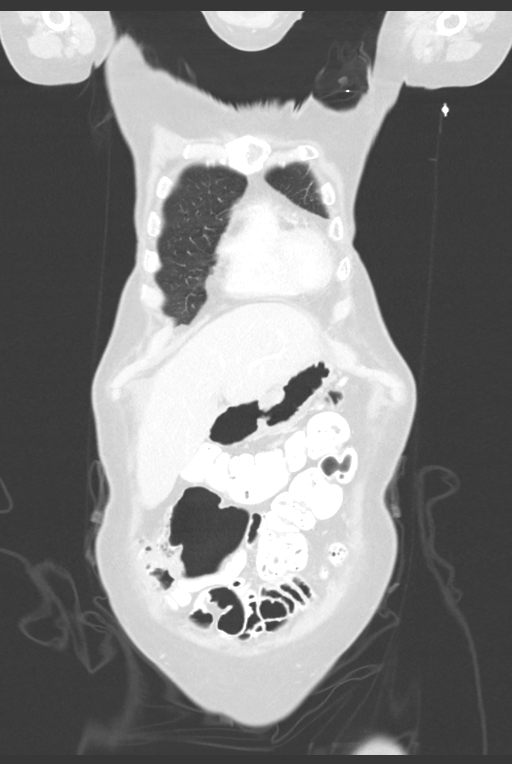
[im 56/139  lung]
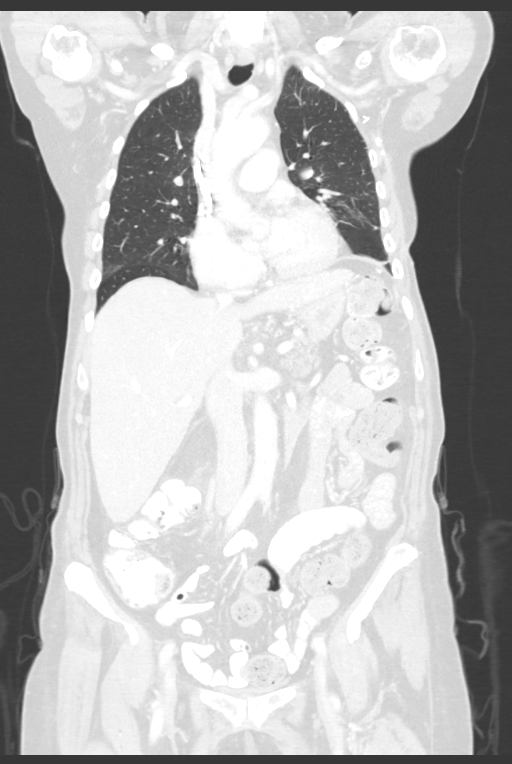
[im 83/139  lung]
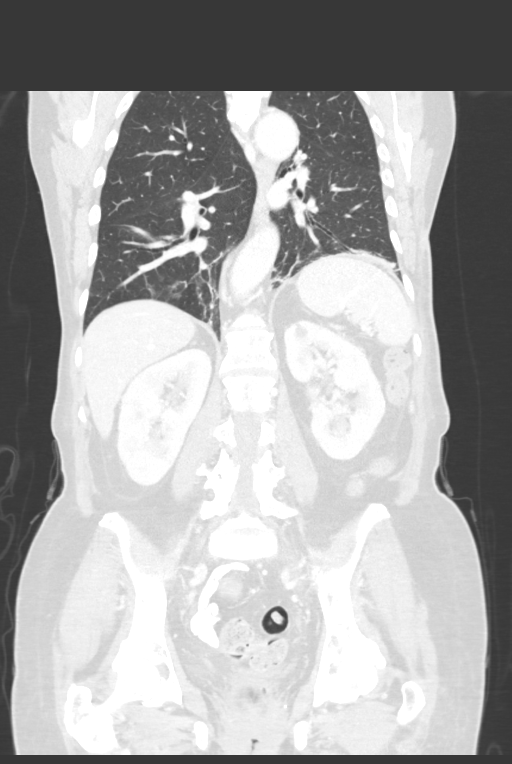

[12 of 36 positions shown; findings below may reference images not displayed]

FINDINGS: CT CHEST FINDINGS

Cardiovascular: Heart size is normal. No aortic aneurysm or
pericardial effusion. A LEFT Port-A-Cath is noted with tip in the
LOWER SVC.

Mediastinum/Nodes: No enlarged mediastinal, hilar, or axillary lymph
nodes. Thyroid gland, trachea, and esophagus demonstrate no
significant findings.

Lungs/Pleura: A 7 mm RIGHT LOWER lobe nodule (series 6: Image 114)
measured 5 mm on 02/05/2018.

No other new or enlarging pulmonary nodules are identified.

A stable 3 mm nodule along the RIGHT major fissure ([DATE]) is
unchanged.

Mild bibasilar atelectasis/scarring has slightly increased.

No airspace disease, consolidation, pleural effusion or
pneumothorax.

Musculoskeletal: Multiple sclerotic metastases throughout the
visualized spine, ribs, sternum, scapula and humeri again
identified. Some of the mid-LOWER thoracic metastases have slightly
increased in size..

CT ABDOMEN PELVIS FINDINGS

Hepatobiliary: The liver is unremarkable. Cholelithiasis noted
without CT evidence of acute cholecystitis. No biliary dilatation.

Pancreas: Unremarkable

Spleen: No significant abnormalities or changes.

Adrenals/Urinary Tract: An indeterminate 2 x 2.5 cm posterior RIGHT
renal lesion ([DATE], 24 Hounsfield units) previously measured 1.2 cm
on 02/05/2018.

Other hypodense renal lesions/cysts are unchanged.

The adrenal glands and bladder are unremarkable.

Stomach/Bowel: Stomach is within normal limits. Appendix appears
normal. No evidence of bowel wall thickening, distention, or
inflammatory changes.

Vascular/Lymphatic: No significant vascular findings are present. No
enlarged abdominal or pelvic lymph nodes.

Reproductive: Status post hysterectomy. No adnexal masses.

Other: No ascites, focal collection or pneumoperitoneum.

Musculoskeletal: Innumerable sclerotic metastases throughout the
visualized spine and bony pelvis are again noted. There are few
sclerotic lesions that have increased in size including a 2.9 cm
RIGHT femoral head metastasis ([DATE]) previously measuring 2.1 cm.
IMPRESSION: 1. Enlarging RIGHT LOWER lobe pulmonary nodule and enlargement of
some of the sclerotic bony metastases compatible with increasing
disease since 02/05/2018.
[DATE]. Enlarging indeterminate RIGHT renal lesion suspicious for
metastasis.
3. No other changes.
4. Cholelithiasis

## 2019-02-12 ENCOUNTER — Telehealth: Payer: Self-pay | Admitting: *Deleted

## 2019-02-12 DIAGNOSIS — C7931 Secondary malignant neoplasm of brain: Secondary | ICD-10-CM

## 2019-02-12 NOTE — Telephone Encounter (Signed)
Orders corrected. 

## 2019-02-22 ENCOUNTER — Inpatient Hospital Stay: Payer: Medicare Other

## 2019-02-22 ENCOUNTER — Inpatient Hospital Stay: Payer: Medicare Other | Attending: Hematology and Oncology

## 2019-02-22 ENCOUNTER — Other Ambulatory Visit: Payer: Self-pay

## 2019-02-22 VITALS — BP 169/95 | HR 73 | Temp 98.2°F | Resp 16

## 2019-02-22 DIAGNOSIS — Z5112 Encounter for antineoplastic immunotherapy: Secondary | ICD-10-CM | POA: Diagnosis present

## 2019-02-22 DIAGNOSIS — C7951 Secondary malignant neoplasm of bone: Secondary | ICD-10-CM

## 2019-02-22 DIAGNOSIS — C50919 Malignant neoplasm of unspecified site of unspecified female breast: Secondary | ICD-10-CM

## 2019-02-22 DIAGNOSIS — C50412 Malignant neoplasm of upper-outer quadrant of left female breast: Secondary | ICD-10-CM

## 2019-02-22 DIAGNOSIS — Z95828 Presence of other vascular implants and grafts: Secondary | ICD-10-CM

## 2019-02-22 DIAGNOSIS — Z170 Estrogen receptor positive status [ER+]: Secondary | ICD-10-CM

## 2019-02-22 LAB — CBC WITH DIFFERENTIAL (CANCER CENTER ONLY)
Abs Immature Granulocytes: 0 10*3/uL (ref 0.00–0.07)
Basophils Absolute: 0.1 10*3/uL (ref 0.0–0.1)
Basophils Relative: 2 %
Eosinophils Absolute: 0.1 10*3/uL (ref 0.0–0.5)
Eosinophils Relative: 3 %
HCT: 30.5 % — ABNORMAL LOW (ref 36.0–46.0)
Hemoglobin: 10.2 g/dL — ABNORMAL LOW (ref 12.0–15.0)
Immature Granulocytes: 0 %
Lymphocytes Relative: 46 %
Lymphs Abs: 1.1 10*3/uL (ref 0.7–4.0)
MCH: 34.3 pg — ABNORMAL HIGH (ref 26.0–34.0)
MCHC: 33.4 g/dL (ref 30.0–36.0)
MCV: 102.7 fL — ABNORMAL HIGH (ref 80.0–100.0)
Monocytes Absolute: 0.2 10*3/uL (ref 0.1–1.0)
Monocytes Relative: 7 %
Neutro Abs: 1.1 10*3/uL — ABNORMAL LOW (ref 1.7–7.7)
Neutrophils Relative %: 42 %
Platelet Count: 79 10*3/uL — ABNORMAL LOW (ref 150–400)
RBC: 2.97 MIL/uL — ABNORMAL LOW (ref 3.87–5.11)
RDW: 13.2 % (ref 11.5–15.5)
WBC Count: 2.5 10*3/uL — ABNORMAL LOW (ref 4.0–10.5)
nRBC: 0 % (ref 0.0–0.2)

## 2019-02-22 LAB — CMP (CANCER CENTER ONLY)
ALT: 23 U/L (ref 0–44)
AST: 33 U/L (ref 15–41)
Albumin: 3.9 g/dL (ref 3.5–5.0)
Alkaline Phosphatase: 110 U/L (ref 38–126)
Anion gap: 7 (ref 5–15)
BUN: 13 mg/dL (ref 8–23)
CO2: 30 mmol/L (ref 22–32)
Calcium: 9.7 mg/dL (ref 8.9–10.3)
Chloride: 103 mmol/L (ref 98–111)
Creatinine: 0.95 mg/dL (ref 0.44–1.00)
GFR, Est AFR Am: >60 mL/min (ref >60)
GFR, Estimated: >60 mL/min (ref >60)
Glucose, Bld: 93 mg/dL (ref 70–99)
Potassium: 3.5 mmol/L (ref 3.5–5.1)
Sodium: 140 mmol/L (ref 135–145)
Total Bilirubin: 0.4 mg/dL (ref 0.3–1.2)
Total Protein: 7 g/dL (ref 6.5–8.1)

## 2019-02-22 MED ORDER — SODIUM CHLORIDE 0.9% FLUSH
10.0000 mL | INTRAVENOUS | Status: DC | PRN
  Administered 2019-02-22: 11:00:00 10 mL
  Filled 2019-02-22: qty 10

## 2019-02-22 MED ORDER — DIPHENHYDRAMINE HCL 25 MG PO CAPS
50.0000 mg | ORAL_CAPSULE | Freq: Once | ORAL | Status: AC
  Administered 2019-02-22: 10:00:00 50 mg via ORAL

## 2019-02-22 MED ORDER — ACETAMINOPHEN 325 MG PO TABS
650.0000 mg | ORAL_TABLET | Freq: Once | ORAL | Status: AC
  Administered 2019-02-22: 10:00:00 650 mg via ORAL

## 2019-02-22 MED ORDER — DIPHENHYDRAMINE HCL 25 MG PO CAPS
ORAL_CAPSULE | ORAL | Status: AC
  Filled 2019-02-22: qty 2

## 2019-02-22 MED ORDER — TRASTUZUMAB CHEMO 150 MG IV SOLR
6.0000 mg/kg | Freq: Once | INTRAVENOUS | Status: AC
  Administered 2019-02-22: 10:00:00 399 mg via INTRAVENOUS
  Filled 2019-02-22: qty 19

## 2019-02-22 MED ORDER — SODIUM CHLORIDE 0.9 % IV SOLN
Freq: Once | INTRAVENOUS | Status: AC
  Administered 2019-02-22: 10:00:00 via INTRAVENOUS
  Filled 2019-02-22: qty 250

## 2019-02-22 MED ORDER — SODIUM CHLORIDE 0.9% FLUSH
10.0000 mL | Freq: Once | INTRAVENOUS | Status: AC
  Administered 2019-02-22: 10 mL
  Filled 2019-02-22: qty 10

## 2019-02-22 MED ORDER — HEPARIN SOD (PORK) LOCK FLUSH 100 UNIT/ML IV SOLN
500.0000 [IU] | Freq: Once | INTRAVENOUS | Status: AC | PRN
  Administered 2019-02-22: 11:00:00 500 [IU]
  Filled 2019-02-22: qty 5

## 2019-02-22 MED ORDER — ACETAMINOPHEN 325 MG PO TABS
ORAL_TABLET | ORAL | Status: AC
  Filled 2019-02-22: qty 2

## 2019-02-22 NOTE — Patient Instructions (Signed)
[facility] Discharge Instructions for Patients Receiving Chemotherapy  Today you received the following chemotherapy agents :  Herceptin.  Remember to discontinue Asprin at this time due to low platelets and bruising.    To help prevent nausea and vomiting after your treatment, we encourage you to take your nausea medication as prescribed.   If you develop nausea and vomiting that is not controlled by your nausea medication, call the clinic.   BELOW ARE SYMPTOMS THAT SHOULD BE REPORTED IMMEDIATELY:  *FEVER GREATER THAN 100.5 F  *CHILLS WITH OR WITHOUT FEVER  NAUSEA AND VOMITING THAT IS NOT CONTROLLED WITH YOUR NAUSEA MEDICATION  *UNUSUAL SHORTNESS OF BREATH  *UNUSUAL BRUISING OR BLEEDING  TENDERNESS IN MOUTH AND THROAT WITH OR WITHOUT PRESENCE OF ULCERS  *URINARY PROBLEMS  *BOWEL PROBLEMS  UNUSUAL RASH Items with * indicate a potential emergency and should be followed up as soon as possible.  Feel free to call the clinic should you have any questions or concerns. The clinic phone number is [phone].  Please show the [facility] at check-in to the Emergency Department and triage nurse.    Thrombocytopenia  Thrombocytopenia means that you have a low number of platelets in your blood. Platelets are tiny cells in the blood. When you bleed, they clump together at the cut or injury to stop the bleeding. This is called blood clotting. Not having enough platelets can cause bleeding problems. Follow these instructions at home: General instructions  Check your skin and inside your mouth for bruises or blood as told by your doctor.  Check to see if there is blood in your spit (sputum), pee (urine), and poop (stool). Do this as told by your doctor.  Ask your doctor if you can drink alcohol.  Take over-the-counter and prescription medicines only as told by your doctor.  Tell all of your doctors that you have this condition. Be sure to tell your dentist  and eye doctor too. Activity  Do not do activities that can cause bumps or bruises until your doctor says it is okay.  Be careful not to cut yourself: ? When you shave. ? When you use scissors, needles, knives, or other tools.  Be careful not to burn yourself: ? When you use an iron. ? When you cook. Contact a doctor if:  You have bruises and you do not know why. Get help right away if:  You are bleeding anywhere on your body.  You have blood in your spit, pee, or poop. This information is not intended to replace advice given to you by your health care provider. Make sure you discuss any questions you have with your health care provider. Document Released: 09/22/2011 Document Revised: 06/05/2016 Document Reviewed: 04/06/2015 Elsevier Interactive Patient Education  2019 [employer].

## 2019-02-22 NOTE — Progress Notes (Signed)
Patient with bruising noted to upper arms and right lower extremity.  Thrombocytopenia noted in lab work.  MD notified.  Per Dr. Lindi Adie hold ASA 81mg .  Nurse provided education on thrombocytopenia precautions.

## 2019-02-25 ENCOUNTER — Telehealth: Payer: Self-pay | Admitting: Adult Health

## 2019-02-25 NOTE — Telephone Encounter (Signed)
Scheduled July appt per sch msg. Mailed printout.

## 2019-03-22 ENCOUNTER — Other Ambulatory Visit: Payer: Self-pay

## 2019-03-22 ENCOUNTER — Inpatient Hospital Stay: Payer: Medicare Other | Attending: Hematology and Oncology

## 2019-03-22 ENCOUNTER — Encounter: Payer: Self-pay | Admitting: Adult Health

## 2019-03-22 ENCOUNTER — Inpatient Hospital Stay: Payer: Medicare Other

## 2019-03-22 ENCOUNTER — Inpatient Hospital Stay (HOSPITAL_BASED_OUTPATIENT_CLINIC_OR_DEPARTMENT_OTHER): Payer: Medicare Other | Admitting: Adult Health

## 2019-03-22 ENCOUNTER — Other Ambulatory Visit: Payer: Self-pay | Admitting: Hematology and Oncology

## 2019-03-22 VITALS — BP 164/85 | HR 74 | Temp 98.7°F | Resp 18 | Ht 68.0 in | Wt 137.0 lb

## 2019-03-22 DIAGNOSIS — Z803 Family history of malignant neoplasm of breast: Secondary | ICD-10-CM | POA: Diagnosis not present

## 2019-03-22 DIAGNOSIS — C7951 Secondary malignant neoplasm of bone: Secondary | ICD-10-CM | POA: Insufficient documentation

## 2019-03-22 DIAGNOSIS — R197 Diarrhea, unspecified: Secondary | ICD-10-CM | POA: Diagnosis not present

## 2019-03-22 DIAGNOSIS — Z87442 Personal history of urinary calculi: Secondary | ICD-10-CM | POA: Insufficient documentation

## 2019-03-22 DIAGNOSIS — C50412 Malignant neoplasm of upper-outer quadrant of left female breast: Secondary | ICD-10-CM

## 2019-03-22 DIAGNOSIS — Z801 Family history of malignant neoplasm of trachea, bronchus and lung: Secondary | ICD-10-CM | POA: Diagnosis not present

## 2019-03-22 DIAGNOSIS — E785 Hyperlipidemia, unspecified: Secondary | ICD-10-CM | POA: Diagnosis not present

## 2019-03-22 DIAGNOSIS — D696 Thrombocytopenia, unspecified: Secondary | ICD-10-CM | POA: Insufficient documentation

## 2019-03-22 DIAGNOSIS — I10 Essential (primary) hypertension: Secondary | ICD-10-CM

## 2019-03-22 DIAGNOSIS — D630 Anemia in neoplastic disease: Secondary | ICD-10-CM

## 2019-03-22 DIAGNOSIS — Z9221 Personal history of antineoplastic chemotherapy: Secondary | ICD-10-CM | POA: Insufficient documentation

## 2019-03-22 DIAGNOSIS — M791 Myalgia, unspecified site: Secondary | ICD-10-CM | POA: Insufficient documentation

## 2019-03-22 DIAGNOSIS — C50919 Malignant neoplasm of unspecified site of unspecified female breast: Secondary | ICD-10-CM

## 2019-03-22 DIAGNOSIS — Z170 Estrogen receptor positive status [ER+]: Secondary | ICD-10-CM

## 2019-03-22 DIAGNOSIS — Z5112 Encounter for antineoplastic immunotherapy: Secondary | ICD-10-CM | POA: Insufficient documentation

## 2019-03-22 DIAGNOSIS — Z79811 Long term (current) use of aromatase inhibitors: Secondary | ICD-10-CM

## 2019-03-22 DIAGNOSIS — Z800 Family history of malignant neoplasm of digestive organs: Secondary | ICD-10-CM

## 2019-03-22 DIAGNOSIS — C7931 Secondary malignant neoplasm of brain: Secondary | ICD-10-CM | POA: Diagnosis not present

## 2019-03-22 DIAGNOSIS — E039 Hypothyroidism, unspecified: Secondary | ICD-10-CM | POA: Insufficient documentation

## 2019-03-22 DIAGNOSIS — R5383 Other fatigue: Secondary | ICD-10-CM | POA: Insufficient documentation

## 2019-03-22 DIAGNOSIS — Z95828 Presence of other vascular implants and grafts: Secondary | ICD-10-CM

## 2019-03-22 DIAGNOSIS — Z8041 Family history of malignant neoplasm of ovary: Secondary | ICD-10-CM

## 2019-03-22 DIAGNOSIS — I252 Old myocardial infarction: Secondary | ICD-10-CM | POA: Diagnosis not present

## 2019-03-22 DIAGNOSIS — I2510 Atherosclerotic heart disease of native coronary artery without angina pectoris: Secondary | ICD-10-CM

## 2019-03-22 DIAGNOSIS — Z923 Personal history of irradiation: Secondary | ICD-10-CM | POA: Insufficient documentation

## 2019-03-22 LAB — CMP (CANCER CENTER ONLY)
ALT: 32 U/L (ref 0–44)
AST: 46 U/L — ABNORMAL HIGH (ref 15–41)
Albumin: 3.8 g/dL (ref 3.5–5.0)
Alkaline Phosphatase: 123 U/L (ref 38–126)
Anion gap: 9 (ref 5–15)
BUN: 11 mg/dL (ref 8–23)
CO2: 29 mmol/L (ref 22–32)
Calcium: 9 mg/dL (ref 8.9–10.3)
Chloride: 103 mmol/L (ref 98–111)
Creatinine: 0.86 mg/dL (ref 0.44–1.00)
GFR, Est AFR Am: >60 mL/min (ref >60)
GFR, Estimated: >60 mL/min (ref >60)
Glucose, Bld: 87 mg/dL (ref 70–99)
Potassium: 3.4 mmol/L — ABNORMAL LOW (ref 3.5–5.1)
Sodium: 141 mmol/L (ref 135–145)
Total Bilirubin: 0.4 mg/dL (ref 0.3–1.2)
Total Protein: 6.6 g/dL (ref 6.5–8.1)

## 2019-03-22 LAB — CBC WITH DIFFERENTIAL (CANCER CENTER ONLY)
Abs Immature Granulocytes: 0.01 10*3/uL (ref 0.00–0.07)
Basophils Absolute: 0.1 10*3/uL (ref 0.0–0.1)
Basophils Relative: 2 %
Eosinophils Absolute: 0.1 10*3/uL (ref 0.0–0.5)
Eosinophils Relative: 3 %
HCT: 28.6 % — ABNORMAL LOW (ref 36.0–46.0)
Hemoglobin: 9.6 g/dL — ABNORMAL LOW (ref 12.0–15.0)
Immature Granulocytes: 0 %
Lymphocytes Relative: 40 %
Lymphs Abs: 1 10*3/uL (ref 0.7–4.0)
MCH: 34.5 pg — ABNORMAL HIGH (ref 26.0–34.0)
MCHC: 33.6 g/dL (ref 30.0–36.0)
MCV: 102.9 fL — ABNORMAL HIGH (ref 80.0–100.0)
Monocytes Absolute: 0.2 10*3/uL (ref 0.1–1.0)
Monocytes Relative: 8 %
Neutro Abs: 1.1 10*3/uL — ABNORMAL LOW (ref 1.7–7.7)
Neutrophils Relative %: 47 %
Platelet Count: 72 10*3/uL — ABNORMAL LOW (ref 150–400)
RBC: 2.78 MIL/uL — ABNORMAL LOW (ref 3.87–5.11)
RDW: 13.2 % (ref 11.5–15.5)
WBC Count: 2.4 10*3/uL — ABNORMAL LOW (ref 4.0–10.5)
nRBC: 0 % (ref 0.0–0.2)

## 2019-03-22 MED ORDER — TRASTUZUMAB CHEMO 150 MG IV SOLR
6.0000 mg/kg | Freq: Once | INTRAVENOUS | Status: AC
  Administered 2019-03-22: 399 mg via INTRAVENOUS
  Filled 2019-03-22: qty 19

## 2019-03-22 MED ORDER — SODIUM CHLORIDE 0.9 % IV SOLN
Freq: Once | INTRAVENOUS | Status: AC
  Administered 2019-03-22: 11:00:00 via INTRAVENOUS
  Filled 2019-03-22: qty 250

## 2019-03-22 MED ORDER — DIPHENHYDRAMINE HCL 25 MG PO CAPS
50.0000 mg | ORAL_CAPSULE | Freq: Once | ORAL | Status: AC
  Administered 2019-03-22: 50 mg via ORAL

## 2019-03-22 MED ORDER — SODIUM CHLORIDE 0.9% FLUSH
10.0000 mL | Freq: Once | INTRAVENOUS | Status: AC
  Administered 2019-03-22: 10 mL
  Filled 2019-03-22: qty 10

## 2019-03-22 MED ORDER — DIPHENHYDRAMINE HCL 25 MG PO CAPS
ORAL_CAPSULE | ORAL | Status: AC
  Filled 2019-03-22: qty 2

## 2019-03-22 MED ORDER — SODIUM CHLORIDE 0.9% FLUSH
10.0000 mL | INTRAVENOUS | Status: DC | PRN
  Administered 2019-03-22: 10 mL
  Filled 2019-03-22: qty 10

## 2019-03-22 MED ORDER — HEPARIN SOD (PORK) LOCK FLUSH 100 UNIT/ML IV SOLN
500.0000 [IU] | Freq: Once | INTRAVENOUS | Status: AC | PRN
  Administered 2019-03-22: 500 [IU]
  Filled 2019-03-22: qty 5

## 2019-03-22 MED ORDER — ACETAMINOPHEN 325 MG PO TABS
650.0000 mg | ORAL_TABLET | Freq: Once | ORAL | Status: AC
  Administered 2019-03-22: 650 mg via ORAL

## 2019-03-22 MED ORDER — ACETAMINOPHEN 325 MG PO TABS
ORAL_TABLET | ORAL | Status: AC
  Filled 2019-03-22: qty 2

## 2019-03-22 NOTE — Progress Notes (Signed)
[name] Riley Cancer Follow up:    [name] Hartshorn, NP [place] [id number]   DIAGNOSIS: Cancer Staging Breast cancer of upper-outer quadrant of left female breast [facility]) Staging form: Breast, AJCC 7th Edition - Clinical: Stage IIB (T2, N1, cM0) - Unsigned - Pathologic: No stage assigned - Unsigned   SUMMARY OF ONCOLOGIC HISTORY:   Breast cancer of upper-outer quadrant of left female breast ([place])   11/14/2011 Surgery    Bilateral mastectomy, prophylactic on the right, left breast IDC 3/18 lymph nodes positive with extracapsular extension ER 89%, PR 81%, HER-2 negative, Ki-67 79% T2 N1 A. stage IIB    12/13/2011 - 06/28/2012 Chemotherapy    4 cycles of FEC followed by 4 cycles of Taxotere    07/17/2012 - 08/22/2012 Radiation Therapy    Adjuvant radiation therapy    08/22/2012 - 03/16/2016 Anti-estrogen oral therapy    Arimidex 1 mg daily    03/16/2016 Relapse/Recurrence    Subcutaneous nodule excision left chest: Infiltrating carcinoma breast primary, ER positive, PR negative    03/29/2016 Imaging    CT CAP and bone scan: Lytic lesions T8 vertebral, T1 posterior element, subcutaneous nodule left lateral chest wall, nonspecific lung nodules; Bone scan: Mets to kull, left humerus, left eighth rib, T7/T8, sternum, left acetabulum    04/28/2016 - 06/17/2017 Chemotherapy    Herceptin, lapatinib, Faslodex, Zometa every 4 weeks, lapatinib discontinued in September 2018 due to elevation of LFTs    06/22/2017 Relapse/Recurrence    Surgical excision:Soft tissue mass left lateral chest wall primary breast cancer, soft tissue mass left medial chest wall breast cancer, tumor is within the dermis extending to the subcutaneous adipose tissue and involves portions of skeletal muscle    08/2017 - 02/16/2018 Chemotherapy    Faslodex with Herceptin and Perjeta along with Zometa every 4 weeks     03/02/2018 - 05/25/2018 Chemotherapy    Kadcyla     05/11/2018 Imaging     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 Associated vasogenic edema within the underlying right cerebral hemisphere without significant midline shift. Signal abnormality throughout the visualized bone marrow, compatible with osseous metastatic disease.       06/04/2018 Imaging    CT CAP: Right lower lobe lung nodule 7 mm (was 5 mm); multiple bone metastases throughout the spine and ribs sternum scapula and humerus, slightly increased lower thoracic mets, right renal lesion 2.5 cm (was 1.2 cm) right femur met increased from 2.1 cm to 2.9 cm    06/15/2018 -  Anti-estrogen oral therapy    Abemaciclib, Herceptin, letrozole, Xgeva     Metastatic breast cancer ([place])   06/29/2017 Initial Diagnosis    Metastatic breast cancer ([place])    06/15/2018 -  Chemotherapy    The patient had trastuzumab (HERCEPTIN) 546 mg in sodium chloride 0.9 % 250 mL chemo infusion, 8 mg/kg = 546 mg, Intravenous,  Once, 10 of 15 cycles Administration: 546 mg (06/15/2018), 399 mg (09/07/2018), 399 mg (07/13/2018), 399 mg (10/12/2018), 399 mg (11/02/2018), 399 mg (11/30/2018), 399 mg (01/24/2019), 399 mg (12/28/2018), 399 mg (02/22/2019), 399 mg (08/10/2018)  for chemotherapy treatment.      CURRENT THERAPY: Herceptin, Abemaciclib, Letrozole, Delton See  INTERVAL HISTORY: [name] Riley 68 y.o. female returns for evaluation prior to receiving treatment for her metastatic triple positive breast cancer.  She is receiving Herceptin, Abemaciclib, Letrozole and Xgeva.    She receives the Herceptin every 4 weeks, and is due for a dose today.  Her most recent echocardiogram was on 12/19/2018 and showed an EF of 60-65%.    She takes Abemaciclib at 161m PO BID She is tolerating this moderately well.  She does have intermittent diarrhea.  She takes imodium for this and it helps.  She says she will experience diarrhea about once every few days.  She wonders if her diet perhaps triggers it since she loves salad and spicy  foods.   She does have some thrombocytopenia, and was previously taking a baby aspirin daily.  She developed easy bruising, and stopped the aspirin.  The bruising has resolved.    She is also taking Letrozole daily and tolerating this well.  She does note some mild joint aches.  She also has some brittle fingernails, that will split once they grow out.    She receives Xgeva injections every three months.  Her most recent dose was on 01/24/2019.  She has no dental issues/concerns and is tolerating this well.    [name] most recent scans in 12/2018 showed stable disease and no new lesions.  She is continuing on her current treatment plan with repeat scans planned in 06/2019.    She notes some itching and uses some cream that helps with this.  She has no skin rashes.    Patient Active Problem List   Diagnosis Date Noted  . Brain metastasis ([place] 07/13/2018  . Goals of care, counseling/discussion 06/05/2018  . UTI (urinary tract infection) 05/31/2018  . Sepsis secondary to UTI ([place] 05/31/2018  . Thrombocytopenia ([facility] 05/31/2018  . Hypokalemia 05/31/2018  . Port-A-Cath in place 03/23/2018  . Elevated LFTs   . Metastatic breast cancer ([facility] 06/29/2017  . Nodule of skin of breast 03/06/2017  . Abscess, abdomen 07/06/2016  . Metastasis to bone ([place] 06/01/2016  . Pneumothorax on left 04/25/2016  . Chest pain 10/23/2014  . Radiation-induced dermatitis 07/27/2012  . Hypertension   . Breast cancer of upper-outer quadrant of left female breast ([facility] 09/01/2011  . Hypothyroidism 06/12/2009  . HYPERLIPIDEMIA 06/12/2009  . Essential hypertension 06/12/2009  . Coronary atherosclerosis 06/12/2009  . DIZZINESS 06/12/2009  . PALPITATIONS 06/12/2009  . Heart attack ([place] 09/16/2000    is allergic to cantaloupe extract allergy skin test; contrast media [iodinated diagnostic agents]; pravastatin; and zosyn [piperacillin sod-tazobactam so].  MEDICAL HISTORY: Past Medical History:  Diagnosis Date  .  Allergy   . Breast cancer ([facility] [place] 08/25/2011   L , invasive ductal carcinoma, ER/PR +,HER2 -  . Cancer ([facility]    left breast cancer  . Coronary artery disease 2001  . Heart attack ([facility] 09/2000   Sep 25, 2000  --no intervention  . History of chemotherapy comp. 08/22/2012   4 cycles of FEC and $ cycles of Taxotere  . Hyperlipidemia   . Hypertension   . Hypothyroidism   . PONV (postoperative nausea and vomiting)    gets sick from anesthesia  . Status post radiation therapy 07/09/12 - 08/22/2012   Left Breast, 60.4 gray    SURGICAL HISTORY: Past Surgical History:  Procedure Laterality Date  . ABDOMINAL HYSTERECTOMY  1998   TAH, oophorectomy  . APPENDECTOMY  1970  . BREAST SURGERY  1998   removal of benign lump in rt breast  . BREAST SURGERY  11/14/11   right simple mastectomy, left mrm  . INCISION AND DRAINAGE OF WOUND Left 07/01/2017   Procedure: IRRIGATION AND DEBRIDEMENT CHEST WALL ABSCESS;  Surgeon: TDonnie Mesa MD;  Location: [facility];  Service: General;  Laterality: Left;  .  MASS EXCISION Left 06/22/2017   Procedure: EXCISION OF CHEST WALL MASSES;  Surgeon: Donnie Mesa, MD;  Location: [place];  Service: General;  Laterality: Left;  Marland Kitchen MASTECTOMY Bilateral    for left breast cancer  . OVARIAN CYST SURGERY Right 1970  . PORT-A-CATH REMOVAL  08/30/2012   Procedure: REMOVAL PORT-A-CATH;  Surgeon: Imogene Burn. [name], MD;  Location: [place];  Service: General;  Laterality: Right;  port removal  . PORTACATH PLACEMENT  11/14/2011   Procedure: INSERTION PORT-A-CATH;  Surgeon: Imogene Burn. [name], MD;  Location: [place];  Service: General;  Laterality: Right;  . PORTACATH PLACEMENT N/A 04/25/2016   Procedure: INSERTION PORT-A-CATH LEFT CHEST;  Surgeon: Donnie Mesa, MD;  Location: [place];  Service: General;  Laterality: N/A;  . skin tags  05/09/1997   left axillary left neck skin tags  . TONSILLECTOMY  1968    SOCIAL HISTORY: Social History    Socioeconomic History  . Marital status: Married    Spouse name: Not on file  . Number of children: 3  . Years of education: Not on file  . Highest education level: Not on file  Occupational History  . Occupation: Works at [employer]  . Financial resource strain: Not on file  . Food insecurity:    Worry: Not on file    Inability: Not on file  . Transportation needs:    Medical: Not on file    Non-medical: Not on file  Tobacco Use  . Smoking status: Never Smoker  . Smokeless tobacco: Never Used  Substance and Sexual Activity  . Alcohol use: No  . Drug use: No  . Sexual activity: Yes    Birth control/protection: Surgical    Comment: menarche 25, Parity age 35, G54, P3, 1 miscarriage,  HRT x 5-10 yrs, Mild Hot Flashes  Lifestyle  . Physical activity:    Days per week: Not on file    Minutes per session: Not on file  . Stress: Not on file  Relationships  . Social connections:    Talks on phone: Not on file    Gets together: Not on file    Attends religious service: Not on file    Active member of club or organization: Not on file    Attends meetings of clubs or organizations: Not on file    Relationship status: Not on file  . Intimate partner violence:    Fear of current or ex partner: Not on file    Emotionally abused: Not on file    Physically abused: Not on file    Forced sexual activity: Not on file  Other Topics Concern  . Not on file  Social History Narrative   Lives at home.    FAMILY HISTORY: Family History  Problem Relation Age of Onset  . Hypertension Maternal Grandmother   . Diabetes Maternal Grandmother   . Cancer Father 39       lung cancer and Prostate Cancer  . Hypertension Mother   . Cancer Paternal Aunt        ovarian  . Cancer Cousin        breast, paternal cousin  . Cancer Paternal Uncle        stomach  . Cancer Paternal Grandfather        Esophagus  . Colon cancer Neg Hx     Review of Systems  Constitutional:  Positive for appetite change and fatigue. Negative for chills, fever and  unexpected weight change.  HENT:   Negative for hearing loss, lump/mass, mouth sores, sore throat and trouble swallowing.   Eyes: Negative for eye problems and icterus.  Respiratory: Negative for chest tightness, cough and shortness of breath.   Cardiovascular: Negative for chest pain, leg swelling and palpitations.  Gastrointestinal: Positive for diarrhea. Negative for abdominal distention, abdominal pain, blood in stool, constipation, nausea and vomiting.  Endocrine: Negative for hot flashes.  Genitourinary: Negative for difficulty urinating.   Musculoskeletal: Negative for arthralgias.  Skin: Negative for itching and rash.  Neurological: Negative for dizziness.  Hematological: Negative for adenopathy. Does not bruise/bleed easily.  Psychiatric/Behavioral: Negative for depression. The patient is not nervous/anxious.       PHYSICAL EXAMINATION  ECOG PERFORMANCE STATUS: 1 - Symptomatic but completely ambulatory  Vitals:   03/22/19 1022  BP: (!) 164/85  Pulse: 74  Resp: 18  Temp: 98.7 F (37.1 C)  SpO2: 100%    Physical Exam Constitutional:      General: She is not in acute distress.    Appearance: Normal appearance. She is not toxic-appearing.  HENT:     Head: Normocephalic and atraumatic.     Mouth/Throat:     Mouth: Mucous membranes are moist.     Pharynx: Oropharynx is clear. No oropharyngeal exudate or posterior oropharyngeal erythema.  Eyes:     General: No scleral icterus.    Pupils: Pupils are equal, round, and reactive to light.  Neck:     Musculoskeletal: Neck supple.  Cardiovascular:     Rate and Rhythm: Normal rate and regular rhythm.     Pulses: Normal pulses.     Heart sounds: Normal heart sounds.  Pulmonary:     Effort: Pulmonary effort is normal.     Breath sounds: Normal breath sounds.  Abdominal:     General: Abdomen is flat. There is no distension.     Palpations: Abdomen is  soft.     Tenderness: There is no abdominal tenderness.  Musculoskeletal:        General: No swelling.  Lymphadenopathy:     Cervical: No cervical adenopathy.  Skin:    General: Skin is warm and dry.     Capillary Refill: Capillary refill takes less than 2 seconds.     Findings: No rash.  Neurological:     General: No focal deficit present.     Mental Status: She is alert.  Psychiatric:        Mood and Affect: Mood normal.        Behavior: Behavior normal.     LABORATORY DATA:  CBC    Component Value Date/Time   WBC 2.4 (L) 03/22/2019 0951   WBC 4.3 07/01/2018 1036   RBC 2.78 (L) 03/22/2019 0951   HGB 9.6 (L) 03/22/2019 0951   HGB 11.5 (L) 09/22/2017 0958   HCT 28.6 (L) 03/22/2019 0951   HCT 34.9 09/22/2017 0958   PLT 72 (L) 03/22/2019 0951   PLT 295 09/22/2017 0958   MCV 102.9 (H) 03/22/2019 0951   MCV 94.3 09/22/2017 0958   MCH 34.5 (H) 03/22/2019 0951   MCHC 33.6 03/22/2019 0951   RDW 13.2 03/22/2019 0951   RDW 15.3 (H) 09/22/2017 0958   LYMPHSABS 1.0 03/22/2019 0951   LYMPHSABS 2.2 09/22/2017 0958   MONOABS 0.2 03/22/2019 0951   MONOABS 0.8 09/22/2017 0958   EOSABS 0.1 03/22/2019 0951   EOSABS 0.1 09/22/2017 0958   BASOSABS 0.1 03/22/2019 0951   BASOSABS 0.0 09/22/2017 0600  CMP     Component Value Date/Time   NA 141 03/22/2019 0951   NA 140 09/22/2017 0958   K 3.4 (L) 03/22/2019 0951   K 3.7 09/22/2017 0958   CL 103 03/22/2019 0951   CL 105 08/10/2012 0908   CO2 29 03/22/2019 0951   CO2 26 09/22/2017 0958   GLUCOSE 87 03/22/2019 0951   GLUCOSE 80 09/22/2017 0958   GLUCOSE 81 08/10/2012 0908   BUN 11 03/22/2019 0951   BUN 12.8 09/22/2017 0958   CREATININE 0.86 03/22/2019 0951   CREATININE 0.6 09/22/2017 0958   CALCIUM 9.0 03/22/2019 0951   CALCIUM 9.1 09/22/2017 0958   PROT 6.6 03/22/2019 0951   PROT 7.0 09/22/2017 0958   ALBUMIN 3.8 03/22/2019 0951   ALBUMIN 4.0 09/22/2017 0958   AST 46 (H) 03/22/2019 0951   AST 37 (H) 09/22/2017 0958    ALT 32 03/22/2019 0951   ALT 31 09/22/2017 0958   ALKPHOS 123 03/22/2019 0951   ALKPHOS 140 09/22/2017 0958   BILITOT 0.4 03/22/2019 0951   BILITOT 0.41 09/22/2017 0958   GFRNONAA >60 03/22/2019 0951   GFRAA >60 03/22/2019 0951           ASSESSMENT and THERAPY PLAN:   Breast cancer of upper-outer quadrant of left female breast ([place]) Left breast invasive ductal carcinomaT2, N1, 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 Was on Arimidex since 08/22/2012 to 03/30/16 Recent treatment: Kadcyla Subcutaneous nodule excisionleft chest: Infiltrating carcinoma breast primary, ER positive, PR negative, HER-2 positive  06/04/2018:12/24/2018 andBone scan: Progression of metastatic disease involving the bone, renal lesion and lung nodule Brain MRI showing dural based metastases: Because the patient is asymptomatic we decided to not perform any procedures at this time.  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 Fatigue: intermittent and she is managing well on her own. 5. Arthralgias: managed with movement and tylenol if needed.   Plan: Continue current therapy, Next scans in 06/2019. Follow-up monthly for Herceptin's and every 3 months for Xgeva.  Dr. Estill Bakes APP f/u every three months.    All questions were answered. The patient knows to call the clinic with any problems, questions or concerns. We can certainly see the patient much sooner if necessary.  A total of (30) minutes of face-to-face time was spent with this patient with  greater than 50% of that time in counseling and care-coordination.  This note was electronically signed. Scot Dock, NP 03/22/2019

## 2019-03-22 NOTE — Patient Instructions (Signed)
[facility] [place] Discharge Instructions for Patients Receiving Chemotherapy  Today you received the following Immunotherapy agent:  Herceptin  To help prevent nausea and vomiting after your treatment, we encourage you to take your nausea medication as directed by your MD.   If you develop nausea and vomiting that is not controlled by your nausea medication, call the clinic.   BELOW ARE SYMPTOMS THAT SHOULD BE REPORTED IMMEDIATELY:  *FEVER GREATER THAN 100.5 F  *CHILLS WITH OR WITHOUT FEVER  NAUSEA AND VOMITING THAT IS NOT CONTROLLED WITH YOUR NAUSEA MEDICATION  *UNUSUAL SHORTNESS OF BREATH  *UNUSUAL BRUISING OR BLEEDING  TENDERNESS IN MOUTH AND THROAT WITH OR WITHOUT PRESENCE OF ULCERS  *URINARY PROBLEMS  *BOWEL PROBLEMS  UNUSUAL RASH Items with * indicate a potential emergency and should be followed up as soon as possible.  Feel free to call the clinic should you have any questions or concerns. The clinic phone number is [phone].  Please show the [place] at check-in to the Emergency Department and triage nurse.  Coronavirus (COVID-19) Are you at risk?  Are you at risk for the Coronavirus (COVID-19)?  To be considered HIGH RISK for Coronavirus (COVID-19), you have to meet the following criteria:  . Traveled to [place], [place], [place], [place] or [place]; or in the [place] to [place], [place], [place], or [place]; and have fever, cough, and shortness of breath within the last 2 weeks of travel OR . Been in close contact with a person diagnosed with COVID-19 within the last 2 weeks and have fever, cough, and shortness of breath . IF YOU DO NOT MEET THESE CRITERIA, YOU ARE CONSIDERED LOW RISK FOR COVID-19.  What to do if you are HIGH RISK for COVID-19?  Marland Kitchen If you are having a medical emergency, call 911. . Seek medical care right away. Before you go to a doctor's office, urgent care or emergency department, call ahead and tell them  about your recent travel, contact with someone diagnosed with COVID-19, and your symptoms. You should receive instructions from your physician's office regarding next steps of care.  . When you arrive at healthcare provider, tell the healthcare staff immediately you have returned from visiting [place], [place], [place], [place] or [place]; or traveled in the [place] to [place], [place], [place], or [place]; in the last two weeks or you have been in close contact with a person diagnosed with COVID-19 in the last 2 weeks.   . Tell the health care staff about your symptoms: fever, cough and shortness of breath. . After you have been seen by a medical provider, you will be either: o Tested for (COVID-19) and discharged home on quarantine except to seek medical care if symptoms worsen, and asked to  - Stay home and avoid contact with others until you get your results (4-5 days)  - Avoid travel on public transportation if possible (such as bus, train, or airplane) or o Sent to the Emergency Department by EMS for evaluation, COVID-19 testing, and possible admission depending on your condition and test results.  What to do if you are LOW RISK for COVID-19?  Reduce your risk of any infection by using the same precautions used for avoiding the common cold or flu:  Marland Kitchen Wash your hands often with soap and warm water for at least 20 seconds.  If soap and water are not readily available, use an alcohol-based hand sanitizer with at least 60% alcohol.  Marland Kitchen  If coughing or sneezing, cover your mouth and nose by coughing or sneezing into the elbow areas of your shirt or coat, into a tissue or into your sleeve (not your hands). . Avoid shaking hands with others and consider head nods or verbal greetings only. . Avoid touching your eyes, nose, or mouth with unwashed hands.  . Avoid close contact with people who are sick. . Avoid places or events with large numbers of people in one location, like concerts or  sporting events. . Carefully consider travel plans you have or are making. . If you are planning any travel outside or inside the [name], visit the CDC's Travelers' Health webpage for the latest health notices. . If you have some symptoms but not all symptoms, continue to monitor at home and seek medical attention if your symptoms worsen. . If you are having a medical emergency, call 911.   [place] / e-Visit: [email or web address]         [facility]: [facility]: [phone]                   [facility]: [phone]

## 2019-03-22 NOTE — Assessment & Plan Note (Addendum)
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 Was on Arimidex since 08/22/2012 to 03/30/16 Recent treatment: Kadcyla Subcutaneous nodule excisionleft chest: Infiltrating carcinoma breast primary, ER positive, PR negative, HER-2 positive  06/04/2018:12/24/2018 andBone scan: Progression of metastatic disease involving the bone, renal lesion and lung nodule Brain MRI showing dural based metastases: Because the patient is asymptomatic we decided to not perform any procedures at this time.  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 Fatigue: intermittent and she is managing well on her own. 5. Arthralgias: managed with movement and tylenol if needed.   Plan: Continue current therapy, Next scans in 06/2019. Follow-up monthly for Herceptin's and every 3 months for Xgeva.  Dr. Estill Bakes APP f/u every three months.

## 2019-04-04 ENCOUNTER — Other Ambulatory Visit: Payer: Self-pay

## 2019-04-04 ENCOUNTER — Ambulatory Visit (HOSPITAL_COMMUNITY)
Admission: RE | Admit: 2019-04-04 | Discharge: 2019-04-04 | Disposition: A | Discharge status: Home / Self Care | Payer: Medicare Other | Source: Ambulatory Visit | Attending: Adult Health | Admitting: Adult Health

## 2019-04-04 DIAGNOSIS — C50412 Malignant neoplasm of upper-outer quadrant of left female breast: Secondary | ICD-10-CM | POA: Insufficient documentation

## 2019-04-04 DIAGNOSIS — I350 Nonrheumatic aortic (valve) stenosis: Secondary | ICD-10-CM | POA: Insufficient documentation

## 2019-04-04 DIAGNOSIS — Z170 Estrogen receptor positive status [ER+]: Secondary | ICD-10-CM | POA: Diagnosis not present

## 2019-04-12 ENCOUNTER — Other Ambulatory Visit: Payer: Self-pay | Admitting: Hematology and Oncology

## 2019-04-15 ENCOUNTER — Ambulatory Visit: Payer: Self-pay | Admitting: Family Medicine

## 2019-04-17 ENCOUNTER — Inpatient Hospital Stay: Payer: Medicare Other

## 2019-04-17 ENCOUNTER — Other Ambulatory Visit: Payer: Self-pay

## 2019-04-17 ENCOUNTER — Inpatient Hospital Stay: Payer: Medicare Other | Attending: Hematology and Oncology

## 2019-04-17 VITALS — BP 143/83 | HR 69 | Temp 98.7°F | Resp 18 | Wt 135.8 lb

## 2019-04-17 DIAGNOSIS — Z95828 Presence of other vascular implants and grafts: Secondary | ICD-10-CM

## 2019-04-17 DIAGNOSIS — C50412 Malignant neoplasm of upper-outer quadrant of left female breast: Secondary | ICD-10-CM

## 2019-04-17 DIAGNOSIS — C50919 Malignant neoplasm of unspecified site of unspecified female breast: Secondary | ICD-10-CM

## 2019-04-17 DIAGNOSIS — Z170 Estrogen receptor positive status [ER+]: Secondary | ICD-10-CM | POA: Insufficient documentation

## 2019-04-17 DIAGNOSIS — C7951 Secondary malignant neoplasm of bone: Secondary | ICD-10-CM

## 2019-04-17 DIAGNOSIS — Z5112 Encounter for antineoplastic immunotherapy: Secondary | ICD-10-CM | POA: Insufficient documentation

## 2019-04-17 LAB — CBC WITH DIFFERENTIAL (CANCER CENTER ONLY)
Abs Immature Granulocytes: 0.02 10*3/uL (ref 0.00–0.07)
Basophils Absolute: 0.1 10*3/uL (ref 0.0–0.1)
Basophils Relative: 2 %
Eosinophils Absolute: 0.1 10*3/uL (ref 0.0–0.5)
Eosinophils Relative: 3 %
HCT: 29.8 % — ABNORMAL LOW (ref 36.0–46.0)
Hemoglobin: 10 g/dL — ABNORMAL LOW (ref 12.0–15.0)
Immature Granulocytes: 1 %
Lymphocytes Relative: 49 %
Lymphs Abs: 1.6 10*3/uL (ref 0.7–4.0)
MCH: 34.7 pg — ABNORMAL HIGH (ref 26.0–34.0)
MCHC: 33.6 g/dL (ref 30.0–36.0)
MCV: 103.5 fL — ABNORMAL HIGH (ref 80.0–100.0)
Monocytes Absolute: 0.3 10*3/uL (ref 0.1–1.0)
Monocytes Relative: 9 %
Neutro Abs: 1.2 10*3/uL — ABNORMAL LOW (ref 1.7–7.7)
Neutrophils Relative %: 36 %
Platelet Count: 82 10*3/uL — ABNORMAL LOW (ref 150–400)
RBC: 2.88 MIL/uL — ABNORMAL LOW (ref 3.87–5.11)
RDW: 13.2 % (ref 11.5–15.5)
WBC Count: 3.3 10*3/uL — ABNORMAL LOW (ref 4.0–10.5)
nRBC: 0 % (ref 0.0–0.2)

## 2019-04-17 LAB — CMP (CANCER CENTER ONLY)
ALT: 29 U/L (ref 0–44)
AST: 40 U/L (ref 15–41)
Albumin: 3.9 g/dL (ref 3.5–5.0)
Alkaline Phosphatase: 149 U/L — ABNORMAL HIGH (ref 38–126)
Anion gap: 11 (ref 5–15)
BUN: 12 mg/dL (ref 8–23)
CO2: 26 mmol/L (ref 22–32)
Calcium: 9.5 mg/dL (ref 8.9–10.3)
Chloride: 103 mmol/L (ref 98–111)
Creatinine: 0.97 mg/dL (ref 0.44–1.00)
GFR, Est AFR Am: >60 mL/min (ref >60)
GFR, Estimated: >60 mL/min (ref >60)
Glucose, Bld: 81 mg/dL (ref 70–99)
Potassium: 3.6 mmol/L (ref 3.5–5.1)
Sodium: 140 mmol/L (ref 135–145)
Total Bilirubin: 0.4 mg/dL (ref 0.3–1.2)
Total Protein: 6.9 g/dL (ref 6.5–8.1)

## 2019-04-17 MED ORDER — HEPARIN SOD (PORK) LOCK FLUSH 100 UNIT/ML IV SOLN
500.0000 [IU] | Freq: Once | INTRAVENOUS | Status: AC | PRN
  Administered 2019-04-17: 500 [IU]
  Filled 2019-04-17: qty 5

## 2019-04-17 MED ORDER — DIPHENHYDRAMINE HCL 25 MG PO CAPS
50.0000 mg | ORAL_CAPSULE | Freq: Once | ORAL | Status: AC
  Administered 2019-04-17: 50 mg via ORAL

## 2019-04-17 MED ORDER — SODIUM CHLORIDE 0.9% FLUSH
10.0000 mL | INTRAVENOUS | Status: DC | PRN
  Administered 2019-04-17: 10 mL
  Filled 2019-04-17: qty 10

## 2019-04-17 MED ORDER — DIPHENHYDRAMINE HCL 25 MG PO CAPS
ORAL_CAPSULE | ORAL | Status: AC
  Filled 2019-04-17: qty 2

## 2019-04-17 MED ORDER — ACETAMINOPHEN 325 MG PO TABS
ORAL_TABLET | ORAL | Status: AC
  Filled 2019-04-17: qty 2

## 2019-04-17 MED ORDER — DENOSUMAB 120 MG/1.7ML ~~LOC~~ SOLN
SUBCUTANEOUS | Status: AC
  Filled 2019-04-17: qty 1.7

## 2019-04-17 MED ORDER — ACETAMINOPHEN 325 MG PO TABS
650.0000 mg | ORAL_TABLET | Freq: Once | ORAL | Status: AC
  Administered 2019-04-17: 650 mg via ORAL

## 2019-04-17 MED ORDER — SODIUM CHLORIDE 0.9 % IV SOLN
Freq: Once | INTRAVENOUS | Status: AC
  Administered 2019-04-17: 14:00:00 via INTRAVENOUS
  Filled 2019-04-17: qty 250

## 2019-04-17 MED ORDER — SODIUM CHLORIDE 0.9% FLUSH
10.0000 mL | Freq: Once | INTRAVENOUS | Status: AC
  Administered 2019-04-17: 10 mL
  Filled 2019-04-17: qty 10

## 2019-04-17 MED ORDER — DENOSUMAB 120 MG/1.7ML ~~LOC~~ SOLN
120.0000 mg | Freq: Once | SUBCUTANEOUS | Status: AC
  Administered 2019-04-17: 120 mg via SUBCUTANEOUS

## 2019-04-17 MED ORDER — TRASTUZUMAB CHEMO 150 MG IV SOLR
6.0000 mg/kg | Freq: Once | INTRAVENOUS | Status: AC
  Administered 2019-04-17: 399 mg via INTRAVENOUS
  Filled 2019-04-17: qty 19

## 2019-04-17 NOTE — Patient Instructions (Signed)
[facility] Discharge Instructions for Patients Receiving Chemotherapy  Today you received the following Immunotherapy agent:  Herceptin  To help prevent nausea and vomiting after your treatment, we encourage you to take your nausea medication as directed by your MD.   If you develop nausea and vomiting that is not controlled by your nausea medication, call the clinic.   BELOW ARE SYMPTOMS THAT SHOULD BE REPORTED IMMEDIATELY:  *FEVER GREATER THAN 100.5 F  *CHILLS WITH OR WITHOUT FEVER  NAUSEA AND VOMITING THAT IS NOT CONTROLLED WITH YOUR NAUSEA MEDICATION  *UNUSUAL SHORTNESS OF BREATH  *UNUSUAL BRUISING OR BLEEDING  TENDERNESS IN MOUTH AND THROAT WITH OR WITHOUT PRESENCE OF ULCERS  *URINARY PROBLEMS  *BOWEL PROBLEMS  UNUSUAL RASH Items with * indicate a potential emergency and should be followed up as soon as possible.  Feel free to call the clinic should you have any questions or concerns. The clinic phone number is [phone].  Please show the [place] at check-in to the Emergency Department and triage nurse.  Coronavirus (COVID-19) Are you at risk?  Are you at risk for the Coronavirus (COVID-19)?  To be considered HIGH RISK for Coronavirus (COVID-19), you have to meet the following criteria:  . Traveled to [place], [place], [place], [place] or [place]; or in the [place] to [place], [place], [place], or [place]; and have fever, cough, and shortness of breath within the last 2 weeks of travel OR . Been in close contact with a person diagnosed with COVID-19 within the last 2 weeks and have fever, cough, and shortness of breath . IF YOU DO NOT MEET THESE CRITERIA, YOU ARE CONSIDERED LOW RISK FOR COVID-19.  What to do if you are HIGH RISK for COVID-19?  Marland Kitchen If you are having a medical emergency, call 911. . Seek medical care right away. Before you go to a doctor's office, urgent care or emergency department, call ahead and tell them  about your recent travel, contact with someone diagnosed with COVID-19, and your symptoms. You should receive instructions from your physician's office regarding next steps of care.  . When you arrive at healthcare provider, tell the healthcare staff immediately you have returned from visiting [place], [place], [place], [place] or [place]; or traveled in the [place] to [place], [address], [place], or [place]; in the last two weeks or you have been in close contact with a person diagnosed with COVID-19 in the last 2 weeks.   . Tell the health care staff about your symptoms: fever, cough and shortness of breath. . After you have been seen by a medical provider, you will be either: o Tested for (COVID-19) and discharged home on quarantine except to seek medical care if symptoms worsen, and asked to  - Stay home and avoid contact with others until you get your results (4-5 days)  - Avoid travel on public transportation if possible (such as bus, train, or airplane) or o Sent to the Emergency Department by EMS for evaluation, COVID-19 testing, and possible admission depending on your condition and test results.  What to do if you are LOW RISK for COVID-19?  Reduce your risk of any infection by using the same precautions used for avoiding the common cold or flu:  Marland Kitchen Wash your hands often with soap and warm water for at least 20 seconds.  If soap and water are not readily available, use an alcohol-based hand sanitizer with at least 60% alcohol.  Marland Kitchen  If coughing or sneezing, cover your mouth and nose by coughing or sneezing into the elbow areas of your shirt or coat, into a tissue or into your sleeve (not your hands). . Avoid shaking hands with others and consider head nods or verbal greetings only. . Avoid touching your eyes, nose, or mouth with unwashed hands.  . Avoid close contact with people who are sick. . Avoid places or events with large numbers of people in one location, like concerts or  sporting events. . Carefully consider travel plans you have or are making. . If you are planning any travel outside or inside the [name], visit the CDC's Travelers' Health webpage for the latest health notices. . If you have some symptoms but not all symptoms, continue to monitor at home and seek medical attention if your symptoms worsen. . If you are having a medical emergency, call 911.   [place] / e-Visit: [email or web address]         [facility]: [facility]: [phone]                   [facility]: [phone]

## 2019-04-18 ENCOUNTER — Other Ambulatory Visit: Payer: Medicare Other

## 2019-04-22 ENCOUNTER — Other Ambulatory Visit: Payer: Medicare Other

## 2019-04-22 ENCOUNTER — Ambulatory Visit: Payer: Medicare Other

## 2019-04-23 ENCOUNTER — Other Ambulatory Visit: Payer: Self-pay

## 2019-04-23 ENCOUNTER — Other Ambulatory Visit: Payer: Self-pay | Admitting: *Deleted

## 2019-04-23 ENCOUNTER — Telehealth: Payer: Self-pay | Admitting: Family Medicine

## 2019-04-23 DIAGNOSIS — C7931 Secondary malignant neoplasm of brain: Secondary | ICD-10-CM

## 2019-04-23 NOTE — Telephone Encounter (Signed)
Patient aware.

## 2019-04-23 NOTE — Telephone Encounter (Signed)
Advise if patient can be seen or needs to be a phone call.

## 2019-04-23 NOTE — Telephone Encounter (Signed)
Please come to office

## 2019-04-24 ENCOUNTER — Ambulatory Visit (INDEPENDENT_AMBULATORY_CARE_PROVIDER_SITE_OTHER): Payer: Medicare Other | Admitting: Family Medicine

## 2019-04-24 ENCOUNTER — Encounter: Payer: Self-pay | Admitting: Family Medicine

## 2019-04-24 VITALS — BP 151/81 | HR 71 | Temp 98.3°F | Ht 68.0 in | Wt 137.0 lb

## 2019-04-24 DIAGNOSIS — E039 Hypothyroidism, unspecified: Secondary | ICD-10-CM | POA: Diagnosis not present

## 2019-04-24 DIAGNOSIS — E782 Mixed hyperlipidemia: Secondary | ICD-10-CM

## 2019-04-24 DIAGNOSIS — C50919 Malignant neoplasm of unspecified site of unspecified female breast: Secondary | ICD-10-CM

## 2019-04-24 DIAGNOSIS — I25118 Atherosclerotic heart disease of native coronary artery with other forms of angina pectoris: Secondary | ICD-10-CM

## 2019-04-24 DIAGNOSIS — I10 Essential (primary) hypertension: Secondary | ICD-10-CM | POA: Diagnosis not present

## 2019-04-24 MED ORDER — METOPROLOL SUCCINATE ER 100 MG PO TB24: 100.0000 mg | ORAL_TABLET | Freq: Every day | ORAL | 3 refills | Status: DC

## 2019-04-24 MED ORDER — LEVOTHYROXINE SODIUM 100 MCG PO TABS: 100.0000 ug | ORAL_TABLET | Freq: Every day | ORAL | 1 refills | Status: DC

## 2019-04-24 NOTE — Progress Notes (Signed)
Subjective:  Patient ID: [name] Riley, female    DOB: July 13, 1951  Age: 68 y.o. MRN: [id number]  CC: Establish Care   HPI [name] Riley presents for patient presents for follow-up on  thyroid. The patient has a history of hypothyroidism for many years. It has been stable recently. Pt. denies any change in  voice, loss of hair, heat or cold intolerance. Energy level has been adequate to good. Patient denies constipation and diarrhea. No myxedema. Medication is as noted below. Verified that pt is taking it daily on an empty stomach. Well tolerated.   Follow-up of hypertension. Patient has no history of headache chest pain or shortness of breath or recent cough. Patient also denies symptoms of TIA such as numbness weakness lateralizing. Patient checks  blood pressure at home and has not had any elevated readings recently. Patient denies side effects from his medication. States taking it regularly. Patient in for follow-up of elevated cholesterol. Doing well without complaints on current medication. Denies side effects of statin including myalgia and arthralgia and nausea. Also in today for liver function testing. Currently no chest pain, shortness of breath or other cardiovascular related symptoms noted.   Patient continues to have chemo for breast cancer with brrain meets.  Depression screen PHQ 2/9 04/24/2019  Decreased Interest 0  Down, Depressed, Hopeless 0  PHQ - 2 Score 0  Some recent data might be hidden    History Tanairy has a past medical history of Allergy, Breast cancer ([place]) (08/25/2011), Cancer [facility]), Coronary artery disease (2001), Heart attack ([place]) (09/2000), History of chemotherapy (comp. 08/22/2012), Hyperlipidemia, Hypertension, Hypothyroidism, PONV (postoperative nausea and vomiting), and Status post radiation therapy (07/09/12 - 08/22/2012).   She has a past surgical history that includes Tonsillectomy (1968); Ovarian cyst surgery (Right, 1970); skin tags (05/09/1997);  Portacath placement (11/14/2011); Breast surgery (1998); Breast surgery (11/14/11); Abdominal hysterectomy (1998); Appendectomy (1970); Port-a-cath removal (08/30/2012); Mastectomy (Bilateral); Portacath placement (N/A, 04/25/2016); Mass excision (Left, 06/22/2017); and Incision and drainage of wound (Left, 07/01/2017).   Her family history includes Cancer in her cousin, paternal aunt, paternal grandfather, and paternal uncle; Cancer (age of onset: 77) in her father; Diabetes in her maternal grandmother; Hypertension in her maternal grandmother and mother.She reports that she has never smoked. She has never used smokeless tobacco. She reports that she does not drink alcohol or use drugs.    ROS Review of Systems  Constitutional: Negative.   HENT: Negative for congestion.   Eyes: Negative for visual disturbance.  Respiratory: Negative for shortness of breath.   Cardiovascular: Negative for chest pain.  Gastrointestinal: Negative for abdominal pain, constipation, diarrhea, nausea and vomiting.  Genitourinary: Negative for difficulty urinating.  Musculoskeletal: Negative for arthralgias and myalgias.  Neurological: Negative for headaches.  Psychiatric/Behavioral: Negative for sleep disturbance.    Objective:  BP (!) 151/81   Pulse 71   Temp 98.3 F (36.8 C) (Oral)   Ht 5' 8"  (1.727 m)   Wt 137 lb (62.1 kg)   BMI 20.83 kg/m   BP Readings from Last 3 Encounters:  04/24/19 (!) 151/81  04/17/19 (!) 143/83  03/22/19 (!) 164/85    Wt Readings from Last 3 Encounters:  04/24/19 137 lb (62.1 kg)  04/17/19 135 lb 12 oz (61.6 kg)  03/22/19 137 lb (62.1 kg)     Physical Exam Constitutional:      General: She is not in acute distress.    Appearance: She is well-developed.  HENT:     Head: Normocephalic  and atraumatic.     Right Ear: External ear normal.     Left Ear: External ear normal.     Nose: Nose normal.  Eyes:     Conjunctiva/sclera: Conjunctivae normal.     Pupils: Pupils are  equal, round, and reactive to light.  Neck:     Musculoskeletal: Normal range of motion and neck supple.     Thyroid: No thyromegaly.  Cardiovascular:     Rate and Rhythm: Normal rate and regular rhythm.     Heart sounds: Normal heart sounds. No murmur.  Pulmonary:     Effort: Pulmonary effort is normal. No respiratory distress.     Breath sounds: Normal breath sounds. No wheezing or rales.  Abdominal:     General: Bowel sounds are normal. There is no distension.     Palpations: Abdomen is soft.     Tenderness: There is no abdominal tenderness.  Lymphadenopathy:     Cervical: No cervical adenopathy.  Skin:    General: Skin is warm and dry.  Neurological:     Mental Status: She is alert and oriented to person, place, and time.     Deep Tendon Reflexes: Reflexes are normal and symmetric.  Psychiatric:        Behavior: Behavior normal.        Thought Content: Thought content normal.        Judgment: Judgment normal.       Assessment & Plan:   Crescent was seen today for establish care.  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  Other orders -     levothyroxine (SYNTHROID) 100 MCG tablet; Take 1 tablet (100 mcg total) by mouth daily before breakfast. BRAND ONLY       I have changed [name] Riley's levothyroxine. I am also having her maintain her aspirin, multivitamin with minerals, acetaminophen, lidocaine-prilocaine, cholecalciferol, diphenhydrAMINE, ondansetron, Verzenio, predniSONE, levETIRAcetam, letrozole, and metoprolol succinate.   Allergies as of 04/24/2019      Reactions   Cantaloupe Extract Allergy Skin Test Shortness Of Breath   Contrast Media [iodinated Diagnostic Agents] Shortness Of Breath, Rash   Pravastatin Other (See Comments)   Legs hurt   Zosyn [piperacillin Sod-tazobactam So] Rash, Other (See Comments)   Temperature increase, facial flushing      Medication List       Accurate as of April 24, 2019 11:26 PM. If you have any questions, ask your nurse or doctor.        acetaminophen 500 MG tablet Commonly known as: TYLENOL Take 1,000 mg by mouth every 6 (six) hours as needed for moderate pain.   aspirin 81 MG tablet Take 81 mg by mouth daily.   cholecalciferol 1000 units tablet Commonly known as: VITAMIN D Take 1 tablet (1,000 Units total) by mouth daily.   diphenhydrAMINE 25 MG tablet Commonly known as: BENADRYL Take 50 mg by mouth at bedtime as needed for sleep.   letrozole 2.5 MG tablet Commonly known as:  FEMARA TAKE ONE (1) TABLET EACH DAY   levETIRAcetam 500 MG tablet Commonly known as: KEPPRA TAKE ONE TABLET BY MOUTH TWICE DAILY   levothyroxine 100 MCG tablet Commonly known as: SYNTHROID Take 1 tablet (100 mcg total) by mouth daily before breakfast. BRAND ONLY   lidocaine-prilocaine cream Commonly known as: EMLA Apply topically as needed (for port access). Apply to affected area once   metoprolol succinate 100 MG 24 hr tablet Commonly known as: TOPROL-XL Take 1 tablet (100 mg total) by mouth daily.   multivitamin with minerals tablet Take 1 tablet by mouth daily.   ondansetron 4 MG disintegrating tablet Commonly known as: ZOFRAN-ODT Take 1 tablet (4 mg total) by mouth every 6 (six) hours as needed for nausea or vomiting.   predniSONE 50 MG tablet Commonly known as: DELTASONE Take 1 66m tablet by mouth 13 hrs, 7hrs, and 1 hr prior to CT Scan. (CT dye allergy protocol)   Verzenio 150 MG tablet Generic drug: abemaciclib TAKE 1 TABLET (150 MG TOTAL) BY MOUTH 2 (TWO)  TIMES DAILY. SWALLOW TABLETS WHOLE. DO NOT CHEW, CRUSH, OR SPLIT TABLETS BEFORE SWALLOWING.        Follow-up: Return in about 6 months (around 10/25/2019).  WClaretta Fraise M.D.

## 2019-04-25 LAB — CBC WITH DIFFERENTIAL/PLATELET
Basophils Absolute: 0.1 10*3/uL (ref 0.0–0.2)
Basos: 2 %
EOS (ABSOLUTE): 0.1 10*3/uL (ref 0.0–0.4)
Eos: 4 %
Hematocrit: 30.8 % — ABNORMAL LOW (ref 34.0–46.6)
Hemoglobin: 10.2 g/dL — ABNORMAL LOW (ref 11.1–15.9)
Immature Grans (Abs): 0 10*3/uL (ref 0.0–0.1)
Immature Granulocytes: 0 %
Lymphocytes Absolute: 1.8 10*3/uL (ref 0.7–3.1)
Lymphs: 50 %
MCH: 34.3 pg — ABNORMAL HIGH (ref 26.6–33.0)
MCHC: 33.1 g/dL (ref 31.5–35.7)
MCV: 104 fL — ABNORMAL HIGH (ref 79–97)
Monocytes Absolute: 0.2 10*3/uL (ref 0.1–0.9)
Monocytes: 7 %
Neutrophils Absolute: 1.3 10*3/uL — ABNORMAL LOW (ref 1.4–7.0)
Neutrophils: 37 %
Platelets: 98 10*3/uL — CL (ref 150–450)
RBC: 2.97 x10E6/uL — ABNORMAL LOW (ref 3.77–5.28)
RDW: 13.6 % (ref 11.7–15.4)
WBC: 3.5 10*3/uL (ref 3.4–10.8)

## 2019-04-25 LAB — CMP14+EGFR
ALT: 42 IU/L — ABNORMAL HIGH (ref 0–32)
AST: 54 IU/L — ABNORMAL HIGH (ref 0–40)
Albumin/Globulin Ratio: 1.9 (ref 1.2–2.2)
Albumin: 4.5 g/dL (ref 3.8–4.8)
Alkaline Phosphatase: 161 IU/L — ABNORMAL HIGH (ref 39–117)
BUN/Creatinine Ratio: 9 — ABNORMAL LOW (ref 12–28)
BUN: 9 mg/dL (ref 8–27)
Bilirubin Total: 0.4 mg/dL (ref 0.0–1.2)
CO2: 25 mmol/L (ref 20–29)
Calcium: 10 mg/dL (ref 8.7–10.3)
Chloride: 100 mmol/L (ref 96–106)
Creatinine, Ser: 0.95 mg/dL (ref 0.57–1.00)
GFR calc Af Amer: 72 mL/min/{1.73_m2} (ref >59)
GFR calc non Af Amer: 63 mL/min/{1.73_m2} (ref >59)
Globulin, Total: 2.4 g/dL (ref 1.5–4.5)
Glucose: 81 mg/dL (ref 65–99)
Potassium: 3.8 mmol/L (ref 3.5–5.2)
Sodium: 141 mmol/L (ref 134–144)
Total Protein: 6.9 g/dL (ref 6.0–8.5)

## 2019-04-25 LAB — THYROID PANEL WITH TSH
Free Thyroxine Index: 2.7 (ref 1.2–4.9)
T3 Uptake Ratio: 22 % — ABNORMAL LOW (ref 24–39)
T4, Total: 12.1 ug/dL — ABNORMAL HIGH (ref 4.5–12.0)
TSH: 1.73 u[IU]/mL (ref 0.450–4.500)

## 2019-04-25 LAB — T4, FREE: Free T4: 1.51 ng/dL (ref 0.82–1.77)

## 2019-04-25 LAB — LIPID PANEL
Chol/HDL Ratio: 3.2 ratio (ref 0.0–4.4)
Cholesterol, Total: 232 mg/dL — ABNORMAL HIGH (ref 100–199)
HDL: 73 mg/dL (ref >39)
LDL Calculated: 129 mg/dL — ABNORMAL HIGH (ref 0–99)
Triglycerides: 148 mg/dL (ref 0–149)
VLDL Cholesterol Cal: 30 mg/dL (ref 5–40)

## 2019-04-30 ENCOUNTER — Other Ambulatory Visit: Payer: Self-pay | Admitting: Family Medicine

## 2019-04-30 MED ORDER — ROSUVASTATIN CALCIUM 5 MG PO TABS: 5.0000 mg | ORAL_TABLET | ORAL | 3 refills | Status: DC

## 2019-05-09 ENCOUNTER — Other Ambulatory Visit: Payer: Self-pay | Admitting: Hematology and Oncology

## 2019-05-17 ENCOUNTER — Inpatient Hospital Stay: Payer: Medicare Other

## 2019-05-17 ENCOUNTER — Other Ambulatory Visit: Payer: Self-pay

## 2019-05-17 VITALS — BP 163/78 | HR 73 | Temp 98.0°F | Resp 16

## 2019-05-17 DIAGNOSIS — C50412 Malignant neoplasm of upper-outer quadrant of left female breast: Secondary | ICD-10-CM

## 2019-05-17 DIAGNOSIS — Z170 Estrogen receptor positive status [ER+]: Secondary | ICD-10-CM

## 2019-05-17 DIAGNOSIS — C7951 Secondary malignant neoplasm of bone: Secondary | ICD-10-CM

## 2019-05-17 DIAGNOSIS — Z95828 Presence of other vascular implants and grafts: Secondary | ICD-10-CM

## 2019-05-17 DIAGNOSIS — Z5112 Encounter for antineoplastic immunotherapy: Secondary | ICD-10-CM | POA: Diagnosis not present

## 2019-05-17 DIAGNOSIS — C50919 Malignant neoplasm of unspecified site of unspecified female breast: Secondary | ICD-10-CM

## 2019-05-17 LAB — CMP (CANCER CENTER ONLY)
ALT: 32 U/L (ref 0–44)
AST: 44 U/L — ABNORMAL HIGH (ref 15–41)
Albumin: 3.8 g/dL (ref 3.5–5.0)
Alkaline Phosphatase: 149 U/L — ABNORMAL HIGH (ref 38–126)
Anion gap: 8 (ref 5–15)
BUN: 15 mg/dL (ref 8–23)
CO2: 29 mmol/L (ref 22–32)
Calcium: 9.6 mg/dL (ref 8.9–10.3)
Chloride: 105 mmol/L (ref 98–111)
Creatinine: 0.94 mg/dL (ref 0.44–1.00)
GFR, Est AFR Am: >60 mL/min (ref >60)
GFR, Estimated: >60 mL/min (ref >60)
Glucose, Bld: 118 mg/dL — ABNORMAL HIGH (ref 70–99)
Potassium: 3.5 mmol/L (ref 3.5–5.1)
Sodium: 142 mmol/L (ref 135–145)
Total Bilirubin: 0.7 mg/dL (ref 0.3–1.2)
Total Protein: 6.7 g/dL (ref 6.5–8.1)

## 2019-05-17 LAB — CBC WITH DIFFERENTIAL (CANCER CENTER ONLY)
Abs Immature Granulocytes: 0 10*3/uL (ref 0.00–0.07)
Basophils Absolute: 0 10*3/uL (ref 0.0–0.1)
Basophils Relative: 1 %
Eosinophils Absolute: 0.1 10*3/uL (ref 0.0–0.5)
Eosinophils Relative: 3 %
HCT: 29.6 % — ABNORMAL LOW (ref 36.0–46.0)
Hemoglobin: 9.9 g/dL — ABNORMAL LOW (ref 12.0–15.0)
Immature Granulocytes: 0 %
Lymphocytes Relative: 41 %
Lymphs Abs: 1 10*3/uL (ref 0.7–4.0)
MCH: 34.1 pg — ABNORMAL HIGH (ref 26.0–34.0)
MCHC: 33.4 g/dL (ref 30.0–36.0)
MCV: 102.1 fL — ABNORMAL HIGH (ref 80.0–100.0)
Monocytes Absolute: 0.3 10*3/uL (ref 0.1–1.0)
Monocytes Relative: 11 %
Neutro Abs: 1.1 10*3/uL — ABNORMAL LOW (ref 1.7–7.7)
Neutrophils Relative %: 44 %
Platelet Count: 69 10*3/uL — ABNORMAL LOW (ref 150–400)
RBC: 2.9 MIL/uL — ABNORMAL LOW (ref 3.87–5.11)
RDW: 13.6 % (ref 11.5–15.5)
WBC Count: 2.5 10*3/uL — ABNORMAL LOW (ref 4.0–10.5)
nRBC: 0 % (ref 0.0–0.2)

## 2019-05-17 MED ORDER — ACETAMINOPHEN 325 MG PO TABS
650.0000 mg | ORAL_TABLET | Freq: Once | ORAL | Status: AC
  Administered 2019-05-17: 650 mg via ORAL

## 2019-05-17 MED ORDER — DIPHENHYDRAMINE HCL 25 MG PO CAPS
ORAL_CAPSULE | ORAL | Status: AC
  Filled 2019-05-17: qty 2

## 2019-05-17 MED ORDER — HEPARIN SOD (PORK) LOCK FLUSH 100 UNIT/ML IV SOLN
500.0000 [IU] | Freq: Once | INTRAVENOUS | Status: AC | PRN
  Administered 2019-05-17: 11:00:00 500 [IU]
  Filled 2019-05-17: qty 5

## 2019-05-17 MED ORDER — SODIUM CHLORIDE 0.9% FLUSH
10.0000 mL | INTRAVENOUS | Status: DC | PRN
  Administered 2019-05-17: 10 mL
  Filled 2019-05-17: qty 10

## 2019-05-17 MED ORDER — SODIUM CHLORIDE 0.9 % IV SOLN
Freq: Once | INTRAVENOUS | Status: AC
  Administered 2019-05-17: 10:00:00 via INTRAVENOUS
  Filled 2019-05-17: qty 250

## 2019-05-17 MED ORDER — TRASTUZUMAB CHEMO 150 MG IV SOLR
6.0000 mg/kg | Freq: Once | INTRAVENOUS | Status: AC
  Administered 2019-05-17: 399 mg via INTRAVENOUS
  Filled 2019-05-17: qty 19

## 2019-05-17 MED ORDER — DIPHENHYDRAMINE HCL 25 MG PO CAPS
50.0000 mg | ORAL_CAPSULE | Freq: Once | ORAL | Status: AC
  Administered 2019-05-17: 50 mg via ORAL

## 2019-05-17 MED ORDER — SODIUM CHLORIDE 0.9% FLUSH
10.0000 mL | Freq: Once | INTRAVENOUS | Status: AC
  Administered 2019-05-17: 10 mL
  Filled 2019-05-17: qty 10

## 2019-05-17 MED ORDER — ACETAMINOPHEN 325 MG PO TABS
ORAL_TABLET | ORAL | Status: AC
  Filled 2019-05-17: qty 2

## 2019-05-17 NOTE — Patient Instructions (Signed)
[place] Discharge Instructions for Patients Receiving Chemotherapy  Today you received the following chemotherapy agents:  Herceptin  To help prevent nausea and vomiting after your treatment, we encourage you to take your nausea medication as prescribed.    If you develop nausea and vomiting that is not controlled by your nausea medication, call the clinic.   BELOW ARE SYMPTOMS THAT SHOULD BE REPORTED IMMEDIATELY:  *FEVER GREATER THAN 100.5 F  *CHILLS WITH OR WITHOUT FEVER  NAUSEA AND VOMITING THAT IS NOT CONTROLLED WITH YOUR NAUSEA MEDICATION  *UNUSUAL SHORTNESS OF BREATH  *UNUSUAL BRUISING OR BLEEDING  TENDERNESS IN MOUTH AND THROAT WITH OR WITHOUT PRESENCE OF ULCERS  *URINARY PROBLEMS  *BOWEL PROBLEMS  UNUSUAL RASH Items with * indicate a potential emergency and should be followed up as soon as possible.  Feel free to call the clinic should you have any questions or concerns. The clinic phone number is [phone].  Please show the [place] at check-in to the Emergency Department and triage nurse.  Coronavirus (COVID-19) Are you at risk?  Are you at risk for the Coronavirus (COVID-19)?  To be considered HIGH RISK for Coronavirus (COVID-19), you have to meet the following criteria:  . Traveled to [place], [place], [place], [place] or [place]; or in the [place] to [place], [place], [place], or [place]; and have fever, cough, and shortness of breath within the last 2 weeks of travel OR . Been in close contact with a person diagnosed with COVID-19 within the last 2 weeks and have fever, cough, and shortness of breath . IF YOU DO NOT MEET THESE CRITERIA, YOU ARE CONSIDERED LOW RISK FOR COVID-19.  What to do if you are HIGH RISK for COVID-19?  Marland Kitchen If you are having a medical emergency, call 911. . Seek medical care right away. Before you go to a doctor's office, urgent care or emergency department, call ahead and tell them about your  recent travel, contact with someone diagnosed with COVID-19, and your symptoms. You should receive instructions from your physician's office regarding next steps of care.  . When you arrive at healthcare provider, tell the healthcare staff immediately you have returned from visiting [place], [place], [place], [place] or [place]; or traveled in the [place] to [place], [place], [place], or [place]; in the last two weeks or you have been in close contact with a person diagnosed with COVID-19 in the last 2 weeks.   . Tell the health care staff about your symptoms: fever, cough and shortness of breath. . After you have been seen by a medical provider, you will be either: o Tested for (COVID-19) and discharged home on quarantine except to seek medical care if symptoms worsen, and asked to  - Stay home and avoid contact with others until you get your results (4-5 days)  - Avoid travel on public transportation if possible (such as bus, train, or airplane) or o Sent to the Emergency Department by EMS for evaluation, COVID-19 testing, and possible admission depending on your condition and test results.  What to do if you are LOW RISK for COVID-19?  Reduce your risk of any infection by using the same precautions used for avoiding the common cold or flu:  Marland Kitchen Wash your hands often with soap and warm water for at least 20 seconds.  If soap and water are not readily available, use an alcohol-based hand sanitizer with at least 60% alcohol.  . If coughing  or sneezing, cover your mouth and nose by coughing or sneezing into the elbow areas of your shirt or coat, into a tissue or into your sleeve (not your hands). . Avoid shaking hands with others and consider head nods or verbal greetings only. . Avoid touching your eyes, nose, or mouth with unwashed hands.  . Avoid close contact with people who are sick. . Avoid places or events with large numbers of people in one location, like concerts or sporting  events. . Carefully consider travel plans you have or are making. . If you are planning any travel outside or inside the [place], visit the CDC's Travelers' Health webpage for the latest health notices. . If you have some symptoms but not all symptoms, continue to monitor at home and seek medical attention if your symptoms worsen. . If you are having a medical emergency, call 911.   ADDITIONAL HEALTHCARE OPTIONS FOR PATIENTS  [facility] / e-Visit: [email or web address]         [facility]: [phone]  [facility]: [phone]                   [facility]: [phone]    

## 2019-05-19 NOTE — Progress Notes (Signed)
The following biosimilar Ogivri (trastuzumab-dkst) has been selected for use in this patient.  Kennith Center, Pharm.D., CPP 05/19/2019@8 :58 AM

## 2019-05-25 ENCOUNTER — Other Ambulatory Visit: Payer: Self-pay | Admitting: Hematology and Oncology

## 2019-05-25 DIAGNOSIS — Z170 Estrogen receptor positive status [ER+]: Secondary | ICD-10-CM

## 2019-05-25 DIAGNOSIS — C50412 Malignant neoplasm of upper-outer quadrant of left female breast: Secondary | ICD-10-CM

## 2019-06-04 ENCOUNTER — Other Ambulatory Visit: Payer: Self-pay | Admitting: Radiation Therapy

## 2019-06-05 ENCOUNTER — Other Ambulatory Visit: Payer: Self-pay

## 2019-06-05 ENCOUNTER — Ambulatory Visit (HOSPITAL_COMMUNITY)
Admission: RE | Admit: 2019-06-05 | Discharge: 2019-06-05 | Disposition: A | Discharge status: Home / Self Care | Payer: Medicare Other | Source: Ambulatory Visit | Attending: Internal Medicine | Admitting: Internal Medicine

## 2019-06-05 DIAGNOSIS — C7931 Secondary malignant neoplasm of brain: Secondary | ICD-10-CM | POA: Diagnosis present

## 2019-06-05 MED ORDER — GADOBUTROL 1 MMOL/ML IV SOLN
7.0000 mL | Freq: Once | INTRAVENOUS | Status: AC | PRN
  Administered 2019-06-05: 7 mL via INTRAVENOUS

## 2019-06-05 MED ORDER — HEPARIN SOD (PORK) LOCK FLUSH 100 UNIT/ML IV SOLN
500.0000 [IU] | INTRAVENOUS | Status: AC | PRN
  Administered 2019-06-05: 500 [IU]

## 2019-06-11 ENCOUNTER — Inpatient Hospital Stay: Payer: Medicare Other | Attending: Hematology and Oncology | Admitting: Internal Medicine

## 2019-06-11 ENCOUNTER — Other Ambulatory Visit: Payer: Self-pay

## 2019-06-11 VITALS — BP 140/88 | HR 91 | Temp 98.5°F | Resp 18 | Ht 68.0 in | Wt 134.4 lb

## 2019-06-11 DIAGNOSIS — Z8041 Family history of malignant neoplasm of ovary: Secondary | ICD-10-CM | POA: Insufficient documentation

## 2019-06-11 DIAGNOSIS — Z803 Family history of malignant neoplasm of breast: Secondary | ICD-10-CM | POA: Diagnosis not present

## 2019-06-11 DIAGNOSIS — Z170 Estrogen receptor positive status [ER+]: Secondary | ICD-10-CM | POA: Diagnosis not present

## 2019-06-11 DIAGNOSIS — E039 Hypothyroidism, unspecified: Secondary | ICD-10-CM | POA: Insufficient documentation

## 2019-06-11 DIAGNOSIS — E785 Hyperlipidemia, unspecified: Secondary | ICD-10-CM | POA: Diagnosis not present

## 2019-06-11 DIAGNOSIS — Z9221 Personal history of antineoplastic chemotherapy: Secondary | ICD-10-CM | POA: Diagnosis not present

## 2019-06-11 DIAGNOSIS — Z5112 Encounter for antineoplastic immunotherapy: Secondary | ICD-10-CM | POA: Insufficient documentation

## 2019-06-11 DIAGNOSIS — Z923 Personal history of irradiation: Secondary | ICD-10-CM | POA: Insufficient documentation

## 2019-06-11 DIAGNOSIS — C50412 Malignant neoplasm of upper-outer quadrant of left female breast: Secondary | ICD-10-CM | POA: Insufficient documentation

## 2019-06-11 DIAGNOSIS — I2510 Atherosclerotic heart disease of native coronary artery without angina pectoris: Secondary | ICD-10-CM | POA: Insufficient documentation

## 2019-06-11 DIAGNOSIS — Z800 Family history of malignant neoplasm of digestive organs: Secondary | ICD-10-CM | POA: Insufficient documentation

## 2019-06-11 DIAGNOSIS — I10 Essential (primary) hypertension: Secondary | ICD-10-CM | POA: Insufficient documentation

## 2019-06-11 DIAGNOSIS — Z79811 Long term (current) use of aromatase inhibitors: Secondary | ICD-10-CM | POA: Diagnosis not present

## 2019-06-11 DIAGNOSIS — C7951 Secondary malignant neoplasm of bone: Secondary | ICD-10-CM | POA: Diagnosis not present

## 2019-06-11 DIAGNOSIS — R569 Unspecified convulsions: Secondary | ICD-10-CM | POA: Insufficient documentation

## 2019-06-11 DIAGNOSIS — C7931 Secondary malignant neoplasm of brain: Secondary | ICD-10-CM | POA: Insufficient documentation

## 2019-06-11 DIAGNOSIS — Z79899 Other long term (current) drug therapy: Secondary | ICD-10-CM | POA: Insufficient documentation

## 2019-06-11 DIAGNOSIS — R609 Edema, unspecified: Secondary | ICD-10-CM | POA: Insufficient documentation

## 2019-06-11 DIAGNOSIS — Z7982 Long term (current) use of aspirin: Secondary | ICD-10-CM | POA: Insufficient documentation

## 2019-06-11 MED ORDER — DEXAMETHASONE 4 MG PO TABS: 4.0000 mg | ORAL_TABLET | Freq: Every day | ORAL | 0 refills | Status: DC

## 2019-06-11 NOTE — Progress Notes (Signed)
[facility] [place], [place] [address] [phone]   Interval Evaluation  Date of Service: 06/11/19 Patient Name: [name] Riley Patient MRN: [id number] Patient DOB: 03-12-1951 Provider: Ventura Sellers, MD  Identifying Statement:  [name] Riley is a 68 y.o. female with seizures, weakness   Primary Cancer:  Oncologic History: Oncology History  Breast cancer of upper-outer quadrant of left female breast ([place])  11/14/2011 Surgery   Bilateral mastectomy, prophylactic on the right, left breast IDC 3/18 lymph nodes positive with extracapsular extension ER 89%, PR 81%, HER-2 negative, Ki-67 79% T2 N1 A. stage IIB   12/13/2011 - 06/28/2012 Chemotherapy   4 cycles of FEC followed by 4 cycles of Taxotere   07/17/2012 - 08/22/2012 Radiation Therapy   Adjuvant radiation therapy   08/22/2012 - 03/16/2016 Anti-estrogen oral therapy   Arimidex 1 mg daily   03/16/2016 Relapse/Recurrence   Subcutaneous nodule excision left chest: Infiltrating carcinoma breast primary, ER positive, PR negative   03/29/2016 Imaging   CT CAP and bone scan: Lytic lesions T8 vertebral, T1 posterior element, subcutaneous nodule left lateral chest wall, nonspecific lung nodules; Bone scan: Mets to kull, left humerus, left eighth rib, T7/T8, sternum, left acetabulum   04/28/2016 - 06/17/2017 Chemotherapy   Herceptin, lapatinib, Faslodex, Zometa every 4 weeks, lapatinib discontinued in September 2018 due to elevation of LFTs   06/22/2017 Relapse/Recurrence   Surgical excision:Soft tissue mass left lateral chest wall primary breast cancer, soft tissue mass left medial chest wall breast cancer, tumor is within the dermis extending to the subcutaneous adipose tissue and involves portions of skeletal muscle   08/2017 - 02/16/2018 Chemotherapy   Faslodex with Herceptin and Perjeta along with Zometa every 4 weeks    03/02/2018 - 05/25/2018 Chemotherapy   Kadcyla    05/11/2018  Imaging   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 Associated vasogenic edema within the underlying right cerebral hemisphere without significant midline shift. Signal abnormality throughout the visualized bone marrow, compatible with osseous metastatic disease.      06/04/2018 Imaging   CT CAP: Right lower lobe lung nodule 7 mm (was 5 mm); multiple bone metastases throughout the spine and ribs sternum scapula and humerus, slightly increased lower thoracic mets, right renal lesion 2.5 cm (was 1.2 cm) right femur met increased from 2.1 cm to 2.9 cm   06/15/2018 -  Anti-estrogen oral therapy   Abemaciclib, Herceptin, letrozole, Xgeva   Metastatic breast cancer ([place])  06/29/2017 Initial Diagnosis   Metastatic breast cancer ([place])   06/15/2018 -  Chemotherapy   The patient had trastuzumab (HERCEPTIN) 546 mg in sodium chloride 0.9 % 250 mL chemo infusion, 8 mg/kg = 546 mg, Intravenous,  Once, 13 of 13 cycles Administration: 546 mg (06/15/2018), 399 mg (09/07/2018), 399 mg (07/13/2018), 399 mg (10/12/2018), 399 mg (11/02/2018), 399 mg (11/30/2018), 399 mg (01/24/2019), 399 mg (12/28/2018), 399 mg (02/22/2019), 399 mg (03/22/2019), 399 mg (04/17/2019), 399 mg (08/10/2018), 399 mg (05/17/2019) trastuzumab-dkst (OGIVRI) 399 mg in sodium chloride 0.9 % 250 mL chemo infusion, 6 mg/kg = 399 mg (100 % of original dose 6 mg/kg), Intravenous,  Once, 0 of 7 cycles Dose modification: 6 mg/kg (original dose 6 mg/kg, Cycle 14, Reason: Other (see comments), Comment: Biosimilar Conversion)  for chemotherapy treatment.      Interval History:  [name] Riley presents today after recent MRI brain.  She describes slight worsening of left hand funtion, characterized as difficulty with tasks such  as folding laundry and tying her shoes.  May also be having mild imbalance issues, she feels like shes "drifting to the left" at times. She has not experienced any further seizures.  Denies headaches.  Medications:  Current Outpatient Medications on File Prior to Visit  Medication Sig Dispense Refill  . acetaminophen (TYLENOL) 500 MG tablet Take 1,000 mg by mouth every 6 (six) hours as needed for moderate pain.    Marland Kitchen aspirin 81 MG tablet Take 81 mg by mouth daily.      . cholecalciferol (VITAMIN D) 1000 units tablet Take 1 tablet (1,000 Units total) by mouth daily.    . diphenhydrAMINE (BENADRYL) 25 MG tablet Take 50 mg by mouth at bedtime as needed for sleep.    Marland Kitchen letrozole (FEMARA) 2.5 MG tablet TAKE ONE (1) TABLET EACH DAY 90 tablet 3  . levETIRAcetam (KEPPRA) 500 MG tablet TAKE ONE TABLET BY MOUTH TWICE DAILY 60 tablet 2  . levothyroxine (SYNTHROID) 100 MCG tablet Take 1 tablet (100 mcg total) by mouth daily before breakfast. BRAND ONLY 90 tablet 1  . lidocaine-prilocaine (EMLA) cream APPLY TOPICALLY AS NEEDED FOR PORT ACCESS 30 g 3  . metoprolol succinate (TOPROL-XL) 100 MG 24 hr tablet Take 1 tablet (100 mg total) by mouth daily. 90 tablet 3  . Multiple Vitamins-Minerals (MULTIVITAMIN WITH MINERALS) tablet Take 1 tablet by mouth daily.      . ondansetron (ZOFRAN-ODT) 4 MG disintegrating tablet Take 1 tablet (4 mg total) by mouth every 6 (six) hours as needed for nausea or vomiting. 30 tablet 3  . predniSONE (DELTASONE) 50 MG tablet Take 1 77m tablet by mouth 13 hrs, 7hrs, and 1 hr prior to CT Scan. (CT dye allergy protocol) 3 tablet 0  . rosuvastatin (CRESTOR) 5 MG tablet Take 1 tablet (5 mg total) by mouth 2 (two) times a week. For cholesterol 26 tablet 3  . VERZENIO 150 MG tablet TAKE 1 TABLET (150 MG TOTAL) BY MOUTH 2 (TWO) TIMES DAILY. SWALLOW TABLETS WHOLE. DO NOT CHEW, CRUSH, OR SPLIT TABLETS BEFORE SWALLOWING. 56 tablet 1   Current Facility-Administered Medications on File Prior to Visit  Medication Dose Route Frequency Provider Last Rate Last Dose  . 0.9 %  sodium chloride infusion   Intravenous Continuous TDonnie Mesa MD      . acetaminophen (TYLENOL) tablet 975 mg  975 mg Oral Q6H TDonnie Mesa MD      . enoxaparin (LOVENOX) injection 40 mg  40 mg Subcutaneous Q24H TDonnie Mesa MD      . morphine 4 MG/ML injection 2-4 mg  2-4 mg Intravenous Q2H PRN TDonnie Mesa MD      . ondansetron (ZOFRAN-ODT) disintegrating tablet 4 mg  4 mg Oral Q6H PRN TDonnie Mesa MD       Or  . ondansetron (ZOFRAN) 4 mg in sodium chloride 0.9 % 50 mL IVPB  4 mg Intravenous Q6H PRN TDonnie Mesa MD      . oxyCODONE (Oxy IR/ROXICODONE) immediate release tablet 5-10 mg  5-10 mg Oral Q4H PRN TDonnie Mesa MD      . piperacillin-tazobactam (ZOSYN) 3.375 g in dextrose 5 % 50 mL IVPB  3.375 g Intravenous Q8H TDonnie Mesa MD      . prochlorperazine (COMPAZINE) tablet 10 mg  10 mg Oral Q6H PRN TDonnie Mesa MD       Or  . prochlorperazine (COMPAZINE) injection 5-10 mg  5-10 mg Intravenous Q6H PRN TDonnie Mesa MD  Allergies:  Allergies  Allergen Reactions  . Cantaloupe Extract Allergy Skin Test Shortness Of Breath  . Contrast Media [Iodinated Diagnostic Agents] Shortness Of Breath and Rash  . Pravastatin Other (See Comments)    Legs hurt  . Zosyn [Piperacillin Sod-Tazobactam So] Rash and Other (See Comments)    Temperature increase, facial flushing   Past Medical History:  Past Medical History:  Diagnosis Date  . Allergy   . Breast cancer ([place]) 08/25/2011   L , invasive ductal carcinoma, ER/PR +,HER2 -  . Cancer [facility])    left breast cancer  . Coronary artery disease 2001  . Heart attack [facility]) 09/2000   Sep 25, 2000  --no intervention  . History of chemotherapy comp. 08/22/2012   4 cycles of FEC and $ cycles of Taxotere  . Hyperlipidemia   . Hypertension   . Hypothyroidism   . PONV (postoperative nausea and vomiting)    gets sick from anesthesia  . Status post radiation therapy 07/09/12 - 08/22/2012   Left Breast, 60.4 gray   Past Surgical History:  Past Surgical History:  Procedure Laterality Date  . ABDOMINAL HYSTERECTOMY  1998   TAH, oophorectomy  .  APPENDECTOMY  1970  . BREAST SURGERY  1998   removal of benign lump in rt breast  . BREAST SURGERY  11/14/11   right simple mastectomy, left mrm  . INCISION AND DRAINAGE OF WOUND Left 07/01/2017   Procedure: IRRIGATION AND DEBRIDEMENT CHEST WALL ABSCESS;  Surgeon: Donnie Mesa, MD;  Location: [facility];  Service: General;  Laterality: Left;  Marland Kitchen MASS EXCISION Left 06/22/2017   Procedure: EXCISION OF CHEST WALL MASSES;  Surgeon: Donnie Mesa, MD;  Location: [facility];  Service: General;  Laterality: Left;  Marland Kitchen MASTECTOMY Bilateral    for left breast cancer  . OVARIAN CYST SURGERY Right 1970  . PORT-A-CATH REMOVAL  08/30/2012   Procedure: REMOVAL PORT-A-CATH;  Surgeon: Imogene Burn. [name], MD;  Location: [facility];  Service: General;  Laterality: Right;  port removal  . PORTACATH PLACEMENT  11/14/2011   Procedure: INSERTION PORT-A-CATH;  Surgeon: Imogene Burn. [name], MD;  Location: [facility];  Service: General;  Laterality: Right;  . PORTACATH PLACEMENT N/A 04/25/2016   Procedure: INSERTION PORT-A-CATH LEFT CHEST;  Surgeon: Donnie Mesa, MD;  Location: [facility];  Service: General;  Laterality: N/A;  . skin tags  05/09/1997   left axillary left neck skin tags  . TONSILLECTOMY  1968   Social History:  Social History   Socioeconomic History  . Marital status: Married    Spouse name: Not on file  . Number of children: 3  . Years of education: Not on file  . Highest education level: Not on file  Occupational History  . Occupation: Works at [employer]  . Financial resource strain: Not on file  . Food insecurity    Worry: Not on file    Inability: Not on file  . Transportation needs    Medical: Not on file    Non-medical: Not on file  Tobacco Use  . Smoking status: Never Smoker  . Smokeless tobacco: Never Used  Substance and Sexual Activity  . Alcohol use: No  . Drug use: No  . Sexual activity: Yes    Birth control/protection: Surgical     Comment: menarche 78, Parity age 78, G33, P3, 1 miscarriage,  HRT x 5-10 yrs, Mild Hot Flashes  Lifestyle  . Physical activity    Days  per week: Not on file    Minutes per session: Not on file  . Stress: Not on file  Relationships  . Social Herbalist on phone: Not on file    Gets together: Not on file    Attends religious service: Not on file    Active member of club or organization: Not on file    Attends meetings of clubs or organizations: Not on file    Relationship status: Not on file  . Intimate partner violence    Fear of current or ex partner: Not on file    Emotionally abused: Not on file    Physically abused: Not on file    Forced sexual activity: Not on file  Other Topics Concern  . Not on file  Social History Narrative   Lives at home.   Family History:  Family History  Problem Relation Age of Onset  . Hypertension Maternal Grandmother   . Diabetes Maternal Grandmother   . Cancer Father 22       lung cancer and Prostate Cancer  . Hypertension Mother   . Cancer Paternal Aunt        ovarian  . Cancer Cousin        breast, paternal cousin  . Cancer Paternal Uncle        stomach  . Cancer Paternal Grandfather        Esophagus  . Colon cancer Neg Hx     Review of Systems: Constitutional: Denies fevers, chills or abnormal weight loss Eyes: Denies blurriness of vision Ears, nose, mouth, throat, and face: Denies mucositis or sore throat Respiratory: Denies cough, dyspnea or wheezes Cardiovascular: Denies palpitation, chest discomfort or lower extremity swelling Gastrointestinal:  Denies nausea, constipation, diarrhea GU: Denies dysuria or incontinence Skin: Denies abnormal skin rashes Neurological: Per HPI Musculoskeletal: Denies joint pain, back or neck discomfort. No decrease in ROM Behavioral/Psych: Denies anxiety, disturbance in thought content, and mood instability   Physical Exam: Vitals:   06/11/19 0951  BP: 140/88  Pulse: 91  Resp: 18   Temp: 98.5 F (36.9 C)  SpO2: 100%   KPS: 80. General: Alert, cooperative, pleasant, in no acute distress Head: Normal EENT: No conjunctival injection or scleral icterus. Oral mucosa moist Lungs: Resp effort normal Cardiac: Regular rate and rhythm Abdomen: Soft, non-distended abdomen Skin: No rashes cyanosis or petechiae. Extremities: No clubbing or edema  Neurologic Exam: Mental Status: Awake, alert, attentive to examiner. Oriented to self and environment. Language is fluent with intact comprehension.  Cranial Nerves: Visual acuity is grossly normal. Visual fields are full. Extra-ocular movements intact. No ptosis. Face is symmetric, tongue midline. Motor: Tone and bulk are normal. Polymini myoclonus with fixed motor set. Slight incoordination with fine motor function in left hand. Reflexes are symmetric, no pathologic reflexes present. Intact finger to nose bilaterally Sensory: Normal Gait: Normal and tandem gait is deferred   Labs: I have reviewed the data as listed    Component Value Date/Time   NA 142 05/17/2019 0840   NA 141 04/24/2019 1143   NA 140 09/22/2017 0958   K 3.5 05/17/2019 0840   K 3.7 09/22/2017 0958   CL 105 05/17/2019 0840   CL 105 08/10/2012 0908   CO2 29 05/17/2019 0840   CO2 26 09/22/2017 0958   GLUCOSE 118 (H) 05/17/2019 0840   GLUCOSE 80 09/22/2017 0958   GLUCOSE 81 08/10/2012 0908   BUN 15 05/17/2019 0840   BUN 9 04/24/2019 1143  BUN 12.8 09/22/2017 0958   CREATININE 0.94 05/17/2019 0840   CREATININE 0.6 09/22/2017 0958   CALCIUM 9.6 05/17/2019 0840   CALCIUM 9.1 09/22/2017 0958   PROT 6.7 05/17/2019 0840   PROT 6.9 04/24/2019 1143   PROT 7.0 09/22/2017 0958   ALBUMIN 3.8 05/17/2019 0840   ALBUMIN 4.5 04/24/2019 1143   ALBUMIN 4.0 09/22/2017 0958   AST 44 (H) 05/17/2019 0840   AST 37 (H) 09/22/2017 0958   ALT 32 05/17/2019 0840   ALT 31 09/22/2017 0958   ALKPHOS 149 (H) 05/17/2019 0840   ALKPHOS 140 09/22/2017 0958   BILITOT 0.7  05/17/2019 0840   BILITOT 0.41 09/22/2017 0958   GFRNONAA >60 05/17/2019 0840   GFRAA >60 05/17/2019 0840   Lab Results  Component Value Date   WBC 2.5 (L) 05/17/2019   NEUTROABS 1.1 (L) 05/17/2019   HGB 9.9 (L) 05/17/2019   HCT 29.6 (L) 05/17/2019   MCV 102.1 (H) 05/17/2019   PLT 69 (L) 05/17/2019     Imaging:  [facility] [place] Clinician Interpretation: I have personally reviewed the CNS images as listed.  My interpretation, in the context of the patient's clinical presentation, is progressive disease  Mr Jeri Cos Wo Contrast  Result Date: 06/07/2019 CLINICAL DATA:  Neoplasm of the head. Dural-based metastasis. Personal history of breast cancer. EXAM: MRI HEAD WITHOUT AND WITH CONTRAST TECHNIQUE: Multiplanar, multiecho pulse sequences of the brain and surrounding structures were obtained without and with intravenous contrast. CONTRAST:  7 mL Gadavist COMPARISON:  MRI brain 02/01/2019 and 11/07/2018 FINDINGS: Brain: There is progressive growth of 2 discrete dural-based lesions involving the posterior right frontal lobe. The more anterior lesion now measures 16.5 x 17.5 x 14 mm. The posterior lesion now measures 9.5 x 13 x 20 mm. Additional less discrete dural thickening has also progressed more medially between the 2 lesions. No remote sites are present. There is marked increase in diffuse vasogenic edema involving the medial right frontal and parietal lobe mass effect. Slight midline shift is now evident. T2 signal changes extend into the corpus callosum. There is some involvement of the right parietal lobe as well. No contralateral disease is evident. The internal auditory canals are within normal limits. The brainstem and cerebellum are within normal limits. Vascular: Flow is present in the major intracranial arteries. Skull and upper cervical spine: Although there is progressive dural based disease, no definite osseous enhancement is present. There is diffuse marrow signal change within the right  frontal and parietal scalp consistent with metastatic disease and possible prior radiation. There is no significant change. Sinuses/Orbits: The paranasal sinuses and mastoid air cells are clear. The globes and orbits are within normal limits. IMPRESSION: 1. Progressive size of dural-based lesions over the right frontal lobe. Findings are consistent with progression of dural-based metastatic disease. 2. Significant progression of diffuse edema in the right frontal and parietal lobe extending to the corpus callosum. 3. Abnormal signal within the right frontal and parietal skull consistent with metastatic disease and possible prior radiation. 4. No new distal disease. 5. Electronically Signed   By: San Morelle M.D.   On: 06/07/2019 06:41      Assessment/Plan 1. Brain metastasis [facility])  Ms. Axelrod is clinically and radiographically progressive today.    Dural based metastasis has been treated only with systemic therapy to this point.  Radiographic response had been achieved but now local progression has occurred.  Recommend referral to radiation oncology for likely single fraction radiosurgery or fractionated SRT.  She  is agreeable to proceed with radiation after discussion of risks and benefits.  In short term, recommend course of dexamethasone due to symptom burden (though mild) and noted edema on MRI.  91m daily x10 days, then she will call with an update regarding clinical changes or improvement so that taper can be arranged.  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  Will discuss case formally in brain/spine tumor board tomorrow AM.    We appreciate the opportunity to participate in the care of CaKesleigh Licausi.    All questions were answered. The patient knows to call the clinic with any problems, questions or concerns. No barriers to learning were detected.  The total time spent in the encounter was 40 minutes and more than 50% was on counseling and review of  test results   ZaVentura SellersMD Medical Director of Neuro-Oncology [facility]Kind[name] WeMcClellan Park8/25/20 9:49 AM

## 2019-06-12 ENCOUNTER — Inpatient Hospital Stay: Payer: Medicare Other

## 2019-06-12 ENCOUNTER — Telehealth: Payer: Self-pay | Admitting: Internal Medicine

## 2019-06-12 NOTE — Progress Notes (Signed)
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  May also be having mild imbalance issues, she feels like she's "drifting to the left" at times.  She has not experienced any further seizures.   MRI Brain 06/05/2019: Progressive size of dural based lesions over the right frontal lobe.  Findings are consistent with progression of dural based metastatic disease.  Significant progression of diffuse edema in the right frontal and parietal lobe extending to the corpus callosum.  Abnormal signal within the right frontal and parietal skull consistent with metastatic disease and possible prior radiation.    Anterior lesion: 16.5 x 17.5 x 14 mm Posterior lesion: 9.5 x 13 x 20 mm  05/11/2018: Dural based metastasis overlying the right frontoparietal convexity.  Associated vasogenic edema within the underlying right cerebral hemisphere without significant midline shift.  Signal abnormality throughout the visualized bone marrow, compatible with osseous metastatic disease.   Past or anticipated interventions, if any, per neurosurgery:  Past or anticipated interventions, if any, per medical oncology:  Dr. Mickeal Skinner 06/11/2019 -Dural based metastasis has been treated only with systemic therapy to this point.  Radiographic response had been achieved but now local progression has occurred. -Recommend referral to radiation oncology for likely single fraction radiosurgery or fractionated SRT.  She is agreeable to proceed with radiation after discussion of risks and benefits. -In short term, recommend course of dexamethasone due to symptom burden (though mild) and noted edema on MRI.  4mg  daily x10 days, then she will call with an update regarding clinical changes or improvement so that taper can be  arranged.  -Notices that her body "shifts to the left", leaning when sitting still.  -Was holding her new great-grand baby in her left arm and she didn't realize that the baby's head was drifting down.   - She feels like her left arm is almost numb.   -Radiation appointments are better in the afternoon.  Nothing 3:00 or after. Dose of Decadron, if applicable: 4 mg daily [id number] days, started Wednesday  Recent neurologic symptoms, if any:   Seizures: No  Headaches: No  Nausea: No  Dizziness/ataxia: She feels like she is leaning to the left side  Difficulty with hand coordination: Left side is weaker, unable to hold grasp.  She has noticed her penmanship is shaky, like a nervous shake  Focal numbness/weakness: Left arm  Visual deficits/changes:  No  Confusion/Memory deficits: No memory changes, some confusion.  SAFETY ISSUES:  Prior radiation? Left Breast 07/17/12-08/22/12  Pacemaker/ICD? No  Possible current pregnancy? Hysterectomy  Is the patient on methotrexate? No  Additional Complaints / other details:  -Has contrast dye allergy will need Prednisone and Benadryl. -Chemo 11/2011-06/2012, 04/2016-06/2017, 08/2017-02/2018, 02/2018-05/2018, 05/2018- -Antiestrogen 08/2012-02/2016, 05/2018- -Recurrence of breast cancer: subcutaneous nodule left chest 03/16/2016 -Lytic lesions: skull, left humerus, left eighth rib, T7, T8, sternum, left acetabulum-03/29/2016 -Recurrence 06/22/2017: Soft tissue mass left lateral chest wall primary breast cancer, soft tissue mass left medial chest wall breast cancer, tumor is within the dermis extending to the subcutaneous adipose tissue and involves portions of skeletal muscle.

## 2019-06-12 NOTE — Telephone Encounter (Signed)
No los per 8/25. °

## 2019-06-13 ENCOUNTER — Encounter: Payer: Self-pay | Admitting: Radiation Oncology

## 2019-06-13 ENCOUNTER — Ambulatory Visit: Payer: Medicare Other

## 2019-06-13 ENCOUNTER — Ambulatory Visit
Admission: RE | Admit: 2019-06-13 | Discharge: 2019-06-13 | Disposition: A | Discharge status: Home / Self Care | Payer: Medicare Other | Source: Ambulatory Visit | Attending: Radiation Oncology | Admitting: Radiation Oncology

## 2019-06-13 ENCOUNTER — Other Ambulatory Visit: Payer: Self-pay

## 2019-06-13 ENCOUNTER — Ambulatory Visit: Payer: Medicare Other | Admitting: Radiation Oncology

## 2019-06-13 VITALS — Ht 68.0 in | Wt 134.0 lb

## 2019-06-13 DIAGNOSIS — C7931 Secondary malignant neoplasm of brain: Secondary | ICD-10-CM

## 2019-06-13 DIAGNOSIS — Z510 Encounter for antineoplastic radiation therapy: Secondary | ICD-10-CM | POA: Insufficient documentation

## 2019-06-13 DIAGNOSIS — C50919 Malignant neoplasm of unspecified site of unspecified female breast: Secondary | ICD-10-CM

## 2019-06-13 MED ORDER — PREDNISONE 50 MG PO TABS: ORAL_TABLET | ORAL | 0 refills | Status: DC

## 2019-06-13 NOTE — Progress Notes (Signed)
Radiation Oncology         [phone] ________________________________  Initial Outpatient Consultation - Conducted via telephone due to current COVID-19 concerns for limiting patient exposure  I spoke with the patient to conduct this consult visit via telephone to spare the patient unnecessary potential exposure in the healthcare setting during the current COVID-19 pandemic. The patient was notified in advance and was offered a [place] meeting to allow for face to face communication but unfortunately reported that they did not have the appropriate resources/technology to support such a visit and instead preferred to proceed with a telephone consult.   ________________________________  Name: [name] Riley        MRN: [id number]  Date of Service: 06/13/2019 DOB: Mar 02, 1951  CN:[id number], Cletus Gash, MD  Ventura Sellers, MD     REFERRING PHYSICIAN: Ventura Sellers, MD   DIAGNOSIS: The primary encounter diagnosis was Brain metastasis [facility]). A diagnosis of Metastatic breast cancer [facility]) was also pertinent to this visit.   HISTORY OF PRESENT ILLNESS: [name] Riley is a 68 y.o. female seen at the request of Dr. Mickeal Skinner for a history of metastatic breast cancer involving the dura. The patient was originally diagnosed her her left breast cancer and underwent bilateral mastectomies and her disease was ER/PR positive, HER2 negative, and staged as Stage IIB, pT2N1aM0. She received adjuvant chemotherapy and postmastectomy radiotherapy with Dr. Lisbeth Renshaw in 2013. She continued on antiestrogen therapy and was diagnosed with a recurrence in the left chest in May 2017 and found to have bony metastases as well. She was found to have HER2 amplification during the work up and began HER2 directed therapy, along with antiestrogen treatment and Zometa. Lapatinib was discontinued and she continued with Fasloex/Herceptin/Perjeta/Zometa until May 2019 when progression was noted, and she received Kadcyla between May and August  of 2019, and subsequently was changed to Herceptin with Abemaciclib, Letrozole, and Xgeva when CNS progression was noted. She had an MRI on 05/11/18 that revealed a 5.1 x 1.2 cm nodular area of the dural in the right frontoparital convexity. She met with Dr. Mickeal Skinner and given the new regimen, the plan was to proceed with systemic therapy to see if this would give penetration to the CNS. She has since been followed by Dr. Mickeal Skinner and in October 2019 the lesion improved to 8 x 45 mm. In January 2020 another MRI revealed this as stable, possibly improved in thickness measuring 7 mm. In April 2020, the site remained stable with 7 mm thickness. Her most recent MRI on 06/05/2019 revealed progressive growth of the anterior site measuring 16.5 x 17.5 x 14 mm, and there in the posterior aspect measuring 9 x 5 x 13 x 20 mm. Less discrete dural thickening was also progressed between the two. She was started on Dexamethasone 4 mg BID by Dr. Mickeal Skinner on Monday of this week. Her case was discussed in conference and she is contacted today by phone to discuss options of stereotactic radiosurgery to these sites.   PREVIOUS RADIATION THERAPY: Yes   07/17/12-08/22/12: The left chest wall and regional nodes were treated to   PAST MEDICAL HISTORY:  Past Medical History:  Diagnosis Date   Allergy    Breast cancer ([place]) 08/25/2011   L , invasive ductal carcinoma, ER/PR +,HER2 -   Cancer ([place])    left breast cancer   Coronary artery disease 2001   Heart attack [facility]) 09/2000   Sep 25, 2000  --no intervention   History of chemotherapy comp. 08/22/2012  4 cycles of FEC and $ cycles of Taxotere   Hyperlipidemia    Hypertension    Hypothyroidism    PONV (postoperative nausea and vomiting)    gets sick from anesthesia   Status post radiation therapy 07/09/12 - 08/22/2012   Left Breast, 60.4 gray       PAST SURGICAL HISTORY: Past Surgical History:  Procedure Laterality Date   ABDOMINAL HYSTERECTOMY  1998   TAH,  oophorectomy   [place]   removal of benign lump in rt breast   BREAST SURGERY  11/14/11   right simple mastectomy, left mrm   INCISION AND DRAINAGE OF WOUND Left 07/01/2017   Procedure: IRRIGATION AND DEBRIDEMENT CHEST WALL ABSCESS;  Surgeon: Donnie Mesa, MD;  Location: [facility];  Service: General;  Laterality: Left;   MASS EXCISION Left 06/22/2017   Procedure: EXCISION OF CHEST WALL MASSES;  Surgeon: Donnie Mesa, MD;  Location: [facility];  Service: General;  Laterality: Left;   MASTECTOMY Bilateral    for left breast cancer   OVARIAN CYST SURGERY Right [place]  08/30/2012   Procedure: REMOVAL PORT-A-CATH;  Surgeon: Imogene Burn. [name], MD;  Location: [facility];  Service: General;  Laterality: Right;  port removal   PORTACATH PLACEMENT  11/14/2011   Procedure: INSERTION PORT-A-CATH;  Surgeon: Imogene Burn. [name], MD;  Location: [facility];  Service: General;  Laterality: Right;   PORTACATH PLACEMENT N/A 04/25/2016   Procedure: INSERTION PORT-A-CATH LEFT CHEST;  Surgeon: Donnie Mesa, MD;  Location: [facility];  Service: General;  Laterality: N/A;   skin tags  05/09/1997   left axillary left neck skin tags   TONSILLECTOMY  1968     FAMILY HISTORY:  Family History  Problem Relation Age of Onset   Hypertension Maternal Grandmother    Diabetes Maternal Grandmother    Cancer Father 40       lung cancer and Prostate Cancer   Hypertension Mother    Cancer Paternal Aunt        ovarian   Cancer Cousin        breast, paternal cousin   Cancer Paternal Uncle        stomach   Cancer Paternal Grandfather        Esophagus   Colon cancer Neg Hx      SOCIAL HISTORY:  reports that she has never smoked. She has never used smokeless tobacco. She reports that she does not drink alcohol or use drugs. The patient is married and lives in [place], [place]. She helps care for her mother who has dementia.     ALLERGIES: Cantaloupe extract allergy skin test, Contrast media [iodinated diagnostic agents], Pravastatin, and Zosyn [piperacillin sod-tazobactam so]   MEDICATIONS:  Current Outpatient Medications  Medication Sig Dispense Refill   acetaminophen (TYLENOL) 500 MG tablet Take 1,000 mg by mouth every 6 (six) hours as needed for moderate pain.     cholecalciferol (VITAMIN D) 1000 units tablet Take 1 tablet (1,000 Units total) by mouth daily.     dexamethasone (DECADRON) 4 MG tablet Take 1 tablet (4 mg total) by mouth daily. 11 tablet 0   diphenhydrAMINE (BENADRYL) 25 MG tablet Take 50 mg by mouth at bedtime as needed for sleep.     letrozole (FEMARA) 2.5 MG tablet TAKE ONE (1) TABLET EACH DAY 90 tablet 3   levETIRAcetam (KEPPRA) 500 MG tablet TAKE ONE TABLET BY MOUTH TWICE DAILY 60 tablet  2   levothyroxine (SYNTHROID) 100 MCG tablet Take 1 tablet (100 mcg total) by mouth daily before breakfast. BRAND ONLY 90 tablet 1   lidocaine-prilocaine (EMLA) cream APPLY TOPICALLY AS NEEDED FOR PORT ACCESS 30 g 3   metoprolol succinate (TOPROL-XL) 100 MG 24 hr tablet Take 1 tablet (100 mg total) by mouth daily. 90 tablet 3   Multiple Vitamins-Minerals (MULTIVITAMIN WITH MINERALS) tablet Take 1 tablet by mouth daily.       ondansetron (ZOFRAN-ODT) 4 MG disintegrating tablet Take 1 tablet (4 mg total) by mouth every 6 (six) hours as needed for nausea or vomiting. 30 tablet 3   rosuvastatin (CRESTOR) 5 MG tablet Take 1 tablet (5 mg total) by mouth 2 (two) times a week. For cholesterol 26 tablet 3   VERZENIO 150 MG tablet TAKE 1 TABLET (150 MG TOTAL) BY MOUTH 2 (TWO) TIMES DAILY. SWALLOW TABLETS WHOLE. DO NOT CHEW, CRUSH, OR SPLIT TABLETS BEFORE SWALLOWING. 56 tablet 1   predniSONE (DELTASONE) 50 MG tablet Take 1 66m tablet by mouth 13 hrs, 7hrs, and 1 hr prior to CT Scan. (CT dye allergy protocol) 3 tablet 0   No current facility-administered medications for this encounter.     Facility-Administered Medications Ordered in Other Encounters  Medication Dose Route Frequency Provider Last Rate Last Dose   0.9 %  sodium chloride infusion   Intravenous Continuous TDonnie Mesa MD       acetaminophen (TYLENOL) tablet 975 mg  975 mg Oral Q6H TDonnie Mesa MD       enoxaparin (LOVENOX) injection 40 mg  40 mg Subcutaneous Q24H TDonnie Mesa MD       morphine 4 MG/ML injection 2-4 mg  2-4 mg Intravenous Q2H PRN TDonnie Mesa MD       ondansetron (ZOFRAN-ODT) disintegrating tablet 4 mg  4 mg Oral Q6H PRN TDonnie Mesa MD       Or   ondansetron (ZOFRAN) 4 mg in sodium chloride 0.9 % 50 mL IVPB  4 mg Intravenous Q6H PRN TDonnie Mesa MD       oxyCODONE (Oxy IR/ROXICODONE) immediate release tablet 5-10 mg  5-10 mg Oral Q4H PRN TDonnie Mesa MD       piperacillin-tazobactam (ZOSYN) 3.375 g in dextrose 5 % 50 mL IVPB  3.375 g Intravenous Q8H TDonnie Mesa MD       prochlorperazine (COMPAZINE) tablet 10 mg  10 mg Oral Q6H PRN TDonnie Mesa MD       Or   prochlorperazine (COMPAZINE) injection 5-10 mg  5-10 mg Intravenous Q6H PRN TDonnie Mesa MD         REVIEW OF SYSTEMS: On review of systems, the patient reports that she is doing okay. She does feel like she's having more shakiness of her upper extremities, and has noticed that at times when she walks, she leans toward the left. She denies any visual changes, uncontrolled movements or falls. She has dropped things when carrying them in her left hand. Her shakiness is bilateral now and she notes this when writing or texting and states that this was prior to starting steroids. She has also noticed facial flushing in the morning when she wakes up but this dissipates gradually in the mid morning. She denies any other new medications, and states this occurred with her treatment prior to the steroids being prescribed.  She denies any chest pain, shortness of breath, cough, fevers, chills, night sweats, unintended  weight changes. She denies any bowel or bladder disturbances, and denies abdominal  pain, nausea or vomiting. She denies any new musculoskeletal or joint aches or pains. A complete review of systems is obtained and is otherwise negative.     PHYSICAL EXAM:  Wt Readings from Last 3 Encounters:  06/13/19 134 lb (60.8 kg)  06/11/19 134 lb 6.4 oz (61 kg)  04/24/19 137 lb (62.1 kg)   Unable to assess due to encounter type   ECOG = 1  0 - Asymptomatic (Fully active, able to carry on all predisease activities without restriction)  1 - Symptomatic but completely ambulatory (Restricted in physically strenuous activity but ambulatory and able to carry out work of a light or sedentary nature. For example, light housework, office work)  2 - Symptomatic, <50% in bed during the day (Ambulatory and capable of all self care but unable to carry out any work activities. Up and about more than 50% of waking hours)  3 - Symptomatic, >50% in bed, but not bedbound (Capable of only limited self-care, confined to bed or chair 50% or more of waking hours)  4 - Bedbound (Completely disabled. Cannot carry on any self-care. Totally confined to bed or chair)  5 - Death   Eustace Pen MM, Creech RH, Tormey DC, et al. [phone]). "Toxicity and response criteria of the [facility]". [place] Oncol. 5 (6): 649-55    LABORATORY DATA:  Lab Results  Component Value Date   WBC 2.5 (L) 05/17/2019   HGB 9.9 (L) 05/17/2019   HCT 29.6 (L) 05/17/2019   MCV 102.1 (H) 05/17/2019   PLT 69 (L) 05/17/2019   Lab Results  Component Value Date   NA 142 05/17/2019   K 3.5 05/17/2019   CL 105 05/17/2019   CO2 29 05/17/2019   Lab Results  Component Value Date   ALT 32 05/17/2019   AST 44 (H) 05/17/2019   ALKPHOS 149 (H) 05/17/2019   BILITOT 0.7 05/17/2019      RADIOGRAPHY: Mr Jeri Cos PY Contrast  Result Date: 06/07/2019 CLINICAL DATA:  Neoplasm of the head. Dural-based metastasis. Personal history  of breast cancer. EXAM: MRI HEAD WITHOUT AND WITH CONTRAST TECHNIQUE: Multiplanar, multiecho pulse sequences of the brain and surrounding structures were obtained without and with intravenous contrast. CONTRAST:  7 mL Gadavist COMPARISON:  MRI brain 02/01/2019 and 11/07/2018 FINDINGS: Brain: There is progressive growth of 2 discrete dural-based lesions involving the posterior right frontal lobe. The more anterior lesion now measures 16.5 x 17.5 x 14 mm. The posterior lesion now measures 9.5 x 13 x 20 mm. Additional less discrete dural thickening has also progressed more medially between the 2 lesions. No remote sites are present. There is marked increase in diffuse vasogenic edema involving the medial right frontal and parietal lobe mass effect. Slight midline shift is now evident. T2 signal changes extend into the corpus callosum. There is some involvement of the right parietal lobe as well. No contralateral disease is evident. The internal auditory canals are within normal limits. The brainstem and cerebellum are within normal limits. Vascular: Flow is present in the major intracranial arteries. Skull and upper cervical spine: Although there is progressive dural based disease, no definite osseous enhancement is present. There is diffuse marrow signal change within the right frontal and parietal scalp consistent with metastatic disease and possible prior radiation. There is no significant change. Sinuses/Orbits: The paranasal sinuses and mastoid air cells are clear. The globes and orbits are within normal limits. IMPRESSION: 1. Progressive size of dural-based lesions over the right frontal  lobe. Findings are consistent with progression of dural-based metastatic disease. 2. Significant progression of diffuse edema in the right frontal and parietal lobe extending to the corpus callosum. 3. Abnormal signal within the right frontal and parietal skull consistent with metastatic disease and possible prior radiation. 4.  No new distal disease. 5. Electronically Signed   By: San Morelle M.D.   On: 06/07/2019 06:41       IMPRESSION/PLAN: 1. 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 Dr. Lisbeth Renshaw discusses the pathology findings and reviews the nature of metastatic breast cancer including dural disease. He discussed the role of stereotactic radiosurgery [facility]) to the dural disease as her systemic therapy has only minimally changed her disease. We reviewed the discussion in brain and oncology conference and Dr. Lisbeth Renshaw would offer a course of fractionated SRS over about 3 fractions due to the size and distribution of her disease. We discussed the risks, benefits, short, and long term effects of radiotherapy, and the patient is interested in proceeding. Dr. Lisbeth Renshaw discusses the delivery and logistics of radiotherapy and she is scheduled to proceed with simulation on Monday 06/17/2019, meet Dr. Saintclair Halsted in neurosurgery on 06/20/2019, and start her first fraction of treatment on 06/21/2019. She will continue her dexamethasone as instructed by Dr. Mickeal Skinner. She will also continue with her planned Herceptin tomorrow with Dr. Lindi Adie. 2. IV Contrast Allergy. Premedications with prednisone and benadryl were sent to her pharmacy and she was instructed on the administration times and side effect profile of these. She will have a driver for her treatment planning and delivery.  Given current concerns for patient exposure during the COVID-19 pandemic, this encounter was conducted via telephone.  The patient has given verbal consent for this type of encounter. The time spent during this encounter was 45 minutes and 50% of that time was spent in the coordination of her care. The attendants for this meeting included Dr. Lisbeth Renshaw, [name], RT,  Shona Simpson, [facility] and Melvyn Novas Guile  During the encounter, Dr. Lisbeth Renshaw, [name], RT, and Shona Simpson [facility] were located at [facility] Radiation  Oncology Department.  Ilena Dieckman [place]  was located at home.  The above documentation reflects my direct findings during this shared patient visit. Please see the separate note by Dr. Lisbeth Renshaw on this date for the remainder of the patient's plan of care.    Carola Rhine, PAC

## 2019-06-14 ENCOUNTER — Other Ambulatory Visit: Payer: Self-pay

## 2019-06-14 ENCOUNTER — Inpatient Hospital Stay: Payer: Medicare Other

## 2019-06-14 VITALS — BP 162/80 | HR 60 | Temp 98.0°F | Resp 18

## 2019-06-14 DIAGNOSIS — Z95828 Presence of other vascular implants and grafts: Secondary | ICD-10-CM

## 2019-06-14 DIAGNOSIS — C50412 Malignant neoplasm of upper-outer quadrant of left female breast: Secondary | ICD-10-CM

## 2019-06-14 DIAGNOSIS — C7951 Secondary malignant neoplasm of bone: Secondary | ICD-10-CM

## 2019-06-14 DIAGNOSIS — Z5112 Encounter for antineoplastic immunotherapy: Secondary | ICD-10-CM | POA: Diagnosis not present

## 2019-06-14 DIAGNOSIS — C50919 Malignant neoplasm of unspecified site of unspecified female breast: Secondary | ICD-10-CM

## 2019-06-14 DIAGNOSIS — Z170 Estrogen receptor positive status [ER+]: Secondary | ICD-10-CM

## 2019-06-14 DIAGNOSIS — C7931 Secondary malignant neoplasm of brain: Secondary | ICD-10-CM

## 2019-06-14 LAB — CBC WITH DIFFERENTIAL (CANCER CENTER ONLY)
Abs Immature Granulocytes: 0.01 10*3/uL (ref 0.00–0.07)
Basophils Absolute: 0 10*3/uL (ref 0.0–0.1)
Basophils Relative: 1 %
Eosinophils Absolute: 0 10*3/uL (ref 0.0–0.5)
Eosinophils Relative: 1 %
HCT: 29.7 % — ABNORMAL LOW (ref 36.0–46.0)
Hemoglobin: 10 g/dL — ABNORMAL LOW (ref 12.0–15.0)
Immature Granulocytes: 0 %
Lymphocytes Relative: 48 %
Lymphs Abs: 1.9 10*3/uL (ref 0.7–4.0)
MCH: 35.1 pg — ABNORMAL HIGH (ref 26.0–34.0)
MCHC: 33.7 g/dL (ref 30.0–36.0)
MCV: 104.2 fL — ABNORMAL HIGH (ref 80.0–100.0)
Monocytes Absolute: 0.4 10*3/uL (ref 0.1–1.0)
Monocytes Relative: 9 %
Neutro Abs: 1.5 10*3/uL — ABNORMAL LOW (ref 1.7–7.7)
Neutrophils Relative %: 41 %
Platelet Count: 61 10*3/uL — ABNORMAL LOW (ref 150–400)
RBC: 2.85 MIL/uL — ABNORMAL LOW (ref 3.87–5.11)
RDW: 14.2 % (ref 11.5–15.5)
WBC Count: 3.8 10*3/uL — ABNORMAL LOW (ref 4.0–10.5)
nRBC: 0 % (ref 0.0–0.2)

## 2019-06-14 LAB — CMP (CANCER CENTER ONLY)
ALT: 97 U/L — ABNORMAL HIGH (ref 0–44)
AST: 96 U/L — ABNORMAL HIGH (ref 15–41)
Albumin: 3.8 g/dL (ref 3.5–5.0)
Alkaline Phosphatase: 149 U/L — ABNORMAL HIGH (ref 38–126)
Anion gap: 9 (ref 5–15)
BUN: 21 mg/dL (ref 8–23)
CO2: 28 mmol/L (ref 22–32)
Calcium: 9.4 mg/dL (ref 8.9–10.3)
Chloride: 104 mmol/L (ref 98–111)
Creatinine: 0.97 mg/dL (ref 0.44–1.00)
GFR, Est AFR Am: >60 mL/min (ref >60)
GFR, Estimated: >60 mL/min (ref >60)
Glucose, Bld: 105 mg/dL — ABNORMAL HIGH (ref 70–99)
Potassium: 3.4 mmol/L — ABNORMAL LOW (ref 3.5–5.1)
Sodium: 141 mmol/L (ref 135–145)
Total Bilirubin: 0.5 mg/dL (ref 0.3–1.2)
Total Protein: 6.7 g/dL (ref 6.5–8.1)

## 2019-06-14 MED ORDER — ACETAMINOPHEN 325 MG PO TABS
650.0000 mg | ORAL_TABLET | Freq: Once | ORAL | Status: AC
  Administered 2019-06-14: 10:00:00 650 mg via ORAL

## 2019-06-14 MED ORDER — SODIUM CHLORIDE 0.9% FLUSH
10.0000 mL | Freq: Once | INTRAVENOUS | Status: AC
  Administered 2019-06-14: 10 mL
  Filled 2019-06-14: qty 10

## 2019-06-14 MED ORDER — SODIUM CHLORIDE 0.9% FLUSH
10.0000 mL | INTRAVENOUS | Status: DC | PRN
  Administered 2019-06-14: 10 mL
  Filled 2019-06-14: qty 10

## 2019-06-14 MED ORDER — HEPARIN SOD (PORK) LOCK FLUSH 100 UNIT/ML IV SOLN
500.0000 [IU] | Freq: Once | INTRAVENOUS | Status: AC | PRN
  Administered 2019-06-14: 11:00:00 500 [IU]
  Filled 2019-06-14: qty 5

## 2019-06-14 MED ORDER — DIPHENHYDRAMINE HCL 25 MG PO CAPS
50.0000 mg | ORAL_CAPSULE | Freq: Once | ORAL | Status: AC
  Administered 2019-06-14: 50 mg via ORAL

## 2019-06-14 MED ORDER — SODIUM CHLORIDE 0.9 % IV SOLN
Freq: Once | INTRAVENOUS | Status: AC
  Administered 2019-06-14: 10:00:00 via INTRAVENOUS
  Filled 2019-06-14: qty 250

## 2019-06-14 MED ORDER — TRASTUZUMAB-DKST CHEMO 150 MG IV SOLR
6.0000 mg/kg | Freq: Once | INTRAVENOUS | Status: AC
  Administered 2019-06-14: 10:00:00 399 mg via INTRAVENOUS
  Filled 2019-06-14: qty 19

## 2019-06-14 MED ORDER — DIPHENHYDRAMINE HCL 25 MG PO CAPS
ORAL_CAPSULE | ORAL | Status: AC
  Filled 2019-06-14: qty 2

## 2019-06-14 MED ORDER — ACETAMINOPHEN 325 MG PO TABS
ORAL_TABLET | ORAL | Status: AC
  Filled 2019-06-14: qty 2

## 2019-06-14 NOTE — Patient Instructions (Signed)

## 2019-06-14 NOTE — Progress Notes (Signed)
Verbal order from Dr. Lindi Adie: okay to treat patient with current labs.

## 2019-06-14 NOTE — Patient Instructions (Signed)
[facility] Discharge Instructions for Patients Receiving Chemotherapy  Today you received the following chemotherapy agents Trastuzumab (OGIVRI).  To help prevent nausea and vomiting after your treatment, we encourage you to take your nausea medication as prescribed.   If you develop nausea and vomiting that is not controlled by your nausea medication, call the clinic.   BELOW ARE SYMPTOMS THAT SHOULD BE REPORTED IMMEDIATELY:  *FEVER GREATER THAN 100.5 F  *CHILLS WITH OR WITHOUT FEVER  NAUSEA AND VOMITING THAT IS NOT CONTROLLED WITH YOUR NAUSEA MEDICATION  *UNUSUAL SHORTNESS OF BREATH  *UNUSUAL BRUISING OR BLEEDING  TENDERNESS IN MOUTH AND THROAT WITH OR WITHOUT PRESENCE OF ULCERS  *URINARY PROBLEMS  *BOWEL PROBLEMS  UNUSUAL RASH Items with * indicate a potential emergency and should be followed up as soon as possible.  Feel free to call the clinic should you have any questions or concerns. The clinic phone number is [phone].  Please show the CHEMO ALERT CARD at check-in to the Emergency Department and triage nurse.  Coronavirus (COVID-19) Are you at risk?  Are you at risk for the Coronavirus (COVID-19)?  To be considered HIGH RISK for Coronavirus (COVID-19), you have to meet the following criteria:  . Traveled to [place], [place], [name], [place] or [place]; or in the [place] to [place], [place], [place], or [place]; and have fever, cough, and shortness of breath within the last 2 weeks of travel OR . Been in close contact with a person diagnosed with COVID-19 within the last 2 weeks and have fever, cough, and shortness of breath . IF YOU DO NOT MEET THESE CRITERIA, YOU ARE CONSIDERED LOW RISK FOR COVID-19.  What to do if you are HIGH RISK for COVID-19?  . If you are having a medical emergency, call 911. . Seek medical care right away. Before you go to a doctor's office, urgent care or emergency department, call ahead and tell them  about your recent travel, contact with someone diagnosed with COVID-19, and your symptoms. You should receive instructions from your physician's office regarding next steps of care.  . When you arrive at healthcare provider, tell the healthcare staff immediately you have returned from visiting [place], [place], [place], [place] or [name]; or traveled in the [place] to [place], [place], [place], or [place]; in the last two weeks or you have been in close contact with a person diagnosed with COVID-19 in the last 2 weeks.   . Tell the health care staff about your symptoms: fever, cough and shortness of breath. . After you have been seen by a medical provider, you will be either: o Tested for (COVID-19) and discharged home on quarantine except to seek medical care if symptoms worsen, and asked to  - Stay home and avoid contact with others until you get your results (4-5 days)  - Avoid travel on public transportation if possible (such as bus, train, or airplane) or o Sent to the Emergency Department by EMS for evaluation, COVID-19 testing, and possible admission depending on your condition and test results.  What to do if you are LOW RISK for COVID-19?  Reduce your risk of any infection by using the same precautions used for avoiding the common cold or flu:  . Wash your hands often with soap and warm water for at least 20 seconds.  If soap and water are not readily available, use an alcohol-based hand sanitizer with at least 60% alcohol.  . If coughing or   sneezing, cover your mouth and nose by coughing or sneezing into the elbow areas of your shirt or coat, into a tissue or into your sleeve (not your hands). . Avoid shaking hands with others and consider head nods or verbal greetings only. . Avoid touching your eyes, nose, or mouth with unwashed hands.  . Avoid close contact with people who are sick. . Avoid places or events with large numbers of people in one location, like concerts or  sporting events. . Carefully consider travel plans you have or are making. . If you are planning any travel outside or inside the [name], visit the CDC's Travelers' Health webpage for the latest health notices. . If you have some symptoms but not all symptoms, continue to monitor at home and seek medical attention if your symptoms worsen. . If you are having a medical emergency, call 911.   [place] / e-Visit: [email or web address]         [facility]: [facility]: [phone]                   [facility]: [phone]

## 2019-06-17 ENCOUNTER — Ambulatory Visit
Admission: RE | Admit: 2019-06-17 | Discharge: 2019-06-17 | Disposition: A | Discharge status: Home / Self Care | Payer: Medicare Other | Source: Ambulatory Visit | Attending: Radiation Oncology | Admitting: Radiation Oncology

## 2019-06-17 VITALS — BP 172/95 | HR 72 | Temp 99.1°F | Resp 16 | Wt 136.8 lb

## 2019-06-17 DIAGNOSIS — C7931 Secondary malignant neoplasm of brain: Secondary | ICD-10-CM

## 2019-06-17 DIAGNOSIS — Z510 Encounter for antineoplastic radiation therapy: Secondary | ICD-10-CM | POA: Diagnosis not present

## 2019-06-17 MED ORDER — SODIUM CHLORIDE 0.9% FLUSH
10.0000 mL | Freq: Once | INTRAVENOUS | Status: AC
  Administered 2019-06-17: 10 mL via INTRAVENOUS

## 2019-06-17 MED ORDER — HEPARIN SOD (PORK) LOCK FLUSH 100 UNIT/ML IV SOLN
500.0000 [IU] | Freq: Once | INTRAVENOUS | Status: AC
  Administered 2019-06-17: 500 [IU] via INTRAVENOUS

## 2019-06-17 NOTE — Progress Notes (Signed)
Has armband been applied?  Yes  Does patient have an allergy to IV contrast dye?: Yes   Has patient ever received premedication for IV contrast dye?: Yes, took benadryl and prednisone.  Does patient take metformin?: No  If patient does take metformin when was the last dose: n/a  Date of lab work: 8/28 BUN: 21 CR: 0.97 Gfr: >60  IV site: Left chest port  Has IV site been added to flowsheet?  Yes  BP (!) 172/95 (BP Location: Left Arm)   Pulse 72   Temp 99.1 F (37.3 C) (Oral)   Resp 16   Wt 136 lb 12.8 oz (62.1 kg)   SpO2 99%   BMI 20.80 kg/m    Wt Readings from Last 3 Encounters:  06/17/19 136 lb 12.8 oz (62.1 kg)  06/13/19 134 lb (60.8 kg)  06/11/19 134 lb 6.4 oz (61 kg)

## 2019-06-18 NOTE — Progress Notes (Signed)
  Radiation Oncology         [phone] ________________________________  Name: [name] Riley MRN: [id number]  Date: 06/17/2019  DOB: Oct 30, 1950  DIAGNOSIS:     ICD-10-CM   1. Brain metastasis ([place])  C79.31     NARRATIVE:  The patient was brought to the [facility].  Identity was confirmed.  All relevant records and images related to the planned course of therapy were reviewed.  The patient freely provided informed written consent to proceed with treatment after reviewing the details related to the planned course of therapy. The consent form was witnessed and verified by the simulation staff. Intravenous access was established for contrast administration. Then, the patient was set-up in a stable reproducible supine position for radiation therapy.  A relocatable thermoplastic stereotactic head frame was fabricated for precise immobilization.  CT images were obtained.  Surface markings were placed.  The CT images were loaded into the planning software and fused with the patient's targeting MRI scan.  Then the target and avoidance structures were contoured.  Treatment planning then occurred.  The radiation prescription was entered and confirmed.  I have requested 3D planning  I have requested a DVH of the following structures: Brain stem, brain, left eye, right eye, lenses, optic chiasm, target volumes, uninvolved brain, and normal tissue.    SPECIAL TREATMENT PROCEDURE:  The planned course of therapy using radiation constitutes a special treatment procedure. Special care is required in the management of this patient for the following reasons. This treatment constitutes a Special Treatment Procedure for the following reason: High dose per fraction requiring special monitoring for increased toxicities of treatment including daily imaging.  The special nature of the planned course of radiotherapy will require increased physician supervision and oversight to ensure patient's safety with  optimal treatment outcomes.  PLAN:  The patient will receive 27 Gy in 3 fraction.   ------------------------------------------------  Jodelle Gross, MD, PhD

## 2019-06-21 ENCOUNTER — Telehealth: Payer: Self-pay | Admitting: *Deleted

## 2019-06-21 ENCOUNTER — Other Ambulatory Visit: Payer: Self-pay

## 2019-06-21 ENCOUNTER — Ambulatory Visit
Admission: RE | Admit: 2019-06-21 | Discharge: 2019-06-21 | Disposition: A | Discharge status: Home / Self Care | Payer: Medicare Other | Source: Ambulatory Visit | Attending: Radiation Oncology | Admitting: Radiation Oncology

## 2019-06-21 VITALS — BP 160/83 | HR 62 | Temp 99.1°F | Resp 18

## 2019-06-21 DIAGNOSIS — C7931 Secondary malignant neoplasm of brain: Secondary | ICD-10-CM | POA: Diagnosis not present

## 2019-06-21 DIAGNOSIS — Z510 Encounter for antineoplastic radiation therapy: Secondary | ICD-10-CM | POA: Insufficient documentation

## 2019-06-21 MED ORDER — DEXAMETHASONE 1 MG PO TABS: ORAL_TABLET | ORAL | 0 refills | Status: DC

## 2019-06-21 NOTE — Progress Notes (Signed)
  Radiation Oncology         [phone] ________________________________  Name: [name] Riley MRN: [id number]  Date: 06/21/2019  DOB: 02-Oct-1951   SPECIAL TREATMENT PROCEDURE   3D TREATMENT PLANNING AND DOSIMETRY: The patient's radiation plan was reviewed and approved by Dr. Saintclair Halsted from neurosurgery and radiation oncology prior to treatment. It showed 3-dimensional radiation distributions overlaid onto the planning CT/MRI image set. [facility] for the target structures as well as the organs at risk were reviewed. The documentation of the 3D plan and dosimetry are filed in the radiation oncology EMR.   NARRATIVE: The patient was brought to the TrueBeam stereotactic radiation treatment machine and placed supine on the CT couch. The head frame was applied, and the patient was set up for stereotactic radiosurgery. Neurosurgery was present for the set-up and delivery   SIMULATION VERIFICATION: In the couch zero-angle position, the patient underwent Exactrac imaging using the Brainlab system with orthogonal KV images. These were carefully aligned and repeated to confirm treatment position for each of the isocenters. The Exactrac snap film verification was repeated at each couch angle.   SPECIAL TREATMENT PROCEDURE: The patient received stereotactic radiosurgery to the following target:  PTV1 and PTV2 target were treatedas a single target given their close proximity using 4 Arcs to a prescription dose of 9 Gy. ExacTrac Snap verification was performed for each couch angle.   STEREOTACTIC TREATMENT MANAGEMENT: Following delivery, the patient was transported to nursing in stable condition and monitored for possible acute effects. Vital signs were recorded . The patient tolerated treatment without significant acute effects, and was discharged to home in stable condition.  PLAN: The patient will return for her 2nd fraction next week.   ------------------------------------------------  Jodelle Gross, MD,  PhD

## 2019-06-21 NOTE — Progress Notes (Signed)
[name] Riley rested with [name] for 15 minutes following her [place] treatment.  Patient denies headache, dizziness, nausea, diplopia or ringing in the ears. Denies fatigue. Patient without complaints.  Instructions about medication given by [name].  Understands to avoid strenuous activity for the next 24 hours and call [phone] with needs.   BP (!) (P) 160/83 (BP Location: Right Arm)   Pulse (P) 62   Temp (P) 99.1 F (37.3 C) (Oral)   Resp (P) 18   SpO2 (P) 99%   [name] Riley M. Leonie Green, BSN

## 2019-06-21 NOTE — Telephone Encounter (Signed)
Starting tomorrow, [name] Riley may decrease decadron to 2mg  daily until radiation is completed, then decrease to 1mg  for one week, then stop therapy.  I will modify script and send to pharmacy.  Ventura Sellers, MD

## 2019-06-21 NOTE — Procedures (Signed)
  Name: [name] Riley  MRN: [id number]  Date: 06/21/2019   DOB: 10-20-1950  Stereotactic Radiosurgery Operative Note  PRE-OPERATIVE DIAGNOSIS:  Multiple Brain Metastases  POST-OPERATIVE DIAGNOSIS:  Multiple Brain Metastases  PROCEDURE:  Stereotactic Radiosurgery  SURGEON:  Lee Kuang P, MD  NARRATIVE: The patient underwent a radiation treatment planning session in the radiation oncology simulation suite under the care of the radiation oncology physician and physicist.  I participated closely in the radiation treatment planning afterwards. The patient underwent planning CT which was fused to 3T high resolution MRI with 1 mm axial slices.  These images were fused on the planning system.  We contoured the gross target volumes and subsequently expanded this to yield the Planning Target Volume. I actively participated in the planning process.  I helped to define and review the target contours and also the contours of the optic pathway, eyes, brainstem and selected nearby organs at risk.  All the dose constraints for critical structures were reviewed and compared to AAPM Task Group 101.  The prescription dose conformity was reviewed.  I approved the plan electronically.    Accordingly, [name] Riley was brought to the TrueBeam stereotactic radiation treatment linac and placed in the custom immobilization mask.  The patient was aligned according to the IR fiducial markers with BrainLab Exactrac, then orthogonal x-rays were used in ExacTrac with the 6DOF robotic table and the shifts were made to align the patient  [name] Riley received stereotactic radiosurgery uneventfully.    Lesions treated:  2   Complex lesions treated:  2 (>3.5 cm, <27mm of optic path, or within the brainstem)   The detailed description of the procedure is recorded in the radiation oncology procedure note.  I was present for the duration of the procedure.  DISPOSITION:  Following delivery, the patient was transported to nursing  in stable condition and monitored for possible acute effects to be discharged to home in stable condition with follow-up in one month.  [name] P, MD 06/21/2019 2:02 PM

## 2019-06-21 NOTE — Telephone Encounter (Signed)
Ms Canepa states she is taking dexamethasone per Dr Mickeal Skinner. Her BP is running 140-150/100. She is concerned about continuing to take it. Is also concerned about 1st RT today with BP being so high. Please advise.

## 2019-06-25 ENCOUNTER — Ambulatory Visit
Admission: RE | Admit: 2019-06-25 | Discharge: 2019-06-25 | Disposition: A | Discharge status: Home / Self Care | Payer: Medicare Other | Source: Ambulatory Visit | Attending: Radiation Oncology | Admitting: Radiation Oncology

## 2019-06-25 ENCOUNTER — Other Ambulatory Visit: Payer: Self-pay

## 2019-06-25 DIAGNOSIS — Z510 Encounter for antineoplastic radiation therapy: Secondary | ICD-10-CM | POA: Diagnosis not present

## 2019-06-25 NOTE — Progress Notes (Signed)
[name] Riley rested with [name] for 15 minutes following her [name] treatment.  Patient denies headache, dizziness, nausea, diplopia or ringing in the ears. Denies fatigue. Patient without complaints.  Instructions about medication given by [name].  Understands to avoid strenuous activity for the next 24 hours and call [phone] with needs.   BP (!) 160/83   Pulse 61   Temp 98.3 F (36.8 C) (Oral)   Resp 17   SpO2 96%    [name] Riley M. Leonie Green, BSN

## 2019-06-27 ENCOUNTER — Encounter: Payer: Self-pay | Admitting: Radiation Oncology

## 2019-06-27 ENCOUNTER — Ambulatory Visit
Admission: RE | Admit: 2019-06-27 | Discharge: 2019-06-27 | Disposition: A | Discharge status: Home / Self Care | Payer: Medicare Other | Source: Ambulatory Visit | Attending: Radiation Oncology | Admitting: Radiation Oncology

## 2019-06-27 DIAGNOSIS — Z510 Encounter for antineoplastic radiation therapy: Secondary | ICD-10-CM | POA: Diagnosis not present

## 2019-06-27 NOTE — Progress Notes (Signed)
[name] Riley rested with [name] for 15 minutes following her [name] treatment.Patient denies headache, dizziness, nausea, diplopia or ringing in the ears. Denies fatigue. Patient without complaints.Instructions about medication given by [name].Understands to avoid strenuous activity for the next 24 hours and call [phone] with needs.  BP 134/85 (BP Location: Right Arm)   Pulse 70   Temp 98.7 F (37.1 C) (Oral)   Resp 18   SpO2 97%      [name] Riley, BSN

## 2019-07-03 ENCOUNTER — Other Ambulatory Visit: Payer: Self-pay | Admitting: *Deleted

## 2019-07-03 DIAGNOSIS — C50919 Malignant neoplasm of unspecified site of unspecified female breast: Secondary | ICD-10-CM

## 2019-07-03 MED ORDER — PREDNISONE 50 MG PO TABS: ORAL_TABLET | ORAL | 0 refills | Status: DC

## 2019-07-05 ENCOUNTER — Encounter (HOSPITAL_COMMUNITY)
Admission: RE | Admit: 2019-07-05 | Discharge: 2019-07-05 | Disposition: A | Discharge status: Home / Self Care | Payer: Medicare Other | Source: Ambulatory Visit | Attending: Adult Health | Admitting: Adult Health

## 2019-07-05 ENCOUNTER — Other Ambulatory Visit: Payer: Self-pay

## 2019-07-05 DIAGNOSIS — C50412 Malignant neoplasm of upper-outer quadrant of left female breast: Secondary | ICD-10-CM | POA: Insufficient documentation

## 2019-07-05 DIAGNOSIS — Z170 Estrogen receptor positive status [ER+]: Secondary | ICD-10-CM | POA: Diagnosis present

## 2019-07-05 MED ORDER — TECHNETIUM TC 99M MEDRONATE IV KIT
22.0000 | PACK | Freq: Once | INTRAVENOUS | Status: AC
  Administered 2019-07-05: 22 via INTRAVENOUS

## 2019-07-05 MED ORDER — IOHEXOL 300 MG/ML  SOLN
100.0000 mL | Freq: Once | INTRAMUSCULAR | Status: AC | PRN
  Administered 2019-07-05: 100 mL via INTRAVENOUS

## 2019-07-05 MED ORDER — SODIUM CHLORIDE (PF) 0.9 % IJ SOLN
INTRAMUSCULAR | Status: AC
  Filled 2019-07-05: qty 50

## 2019-07-05 MED ORDER — HEPARIN SOD (PORK) LOCK FLUSH 100 UNIT/ML IV SOLN
INTRAVENOUS | Status: AC
  Filled 2019-07-05: qty 5

## 2019-07-10 ENCOUNTER — Telehealth: Payer: Self-pay

## 2019-07-10 NOTE — Telephone Encounter (Signed)
Oral Oncology Patient Advocate Encounter  [employer] patient assistance application will expire 10/17/19.  I called the patient and went over this with her. She will be coming in on 9/25 and I will have her sign the application while she is in the office.  I will not fax this renewal application until the renewal period has begun but I need to start getting them ready.  The patient verbalized understanding and great appreciation.  [place] Patient [employer] Phone [phone] Fax [phone] 07/10/2019   1:58 PM

## 2019-07-11 ENCOUNTER — Other Ambulatory Visit: Payer: Self-pay | Admitting: *Deleted

## 2019-07-11 DIAGNOSIS — C50919 Malignant neoplasm of unspecified site of unspecified female breast: Secondary | ICD-10-CM

## 2019-07-11 NOTE — Progress Notes (Signed)
Patient Care Team: [name], MD as PCP - General (Family Medicine)  DIAGNOSIS:    ICD-10-CM   1. Malignant neoplasm of upper-outer quadrant of left breast in female, estrogen receptor positive ([name] Riley)  C50.412    Z17.0     SUMMARY OF ONCOLOGIC HISTORY: Oncology History  Breast cancer of upper-outer quadrant of left female breast ([place])  11/14/2011 Surgery   Bilateral mastectomy, prophylactic on the right, left breast IDC 3/18 lymph nodes positive with extracapsular extension ER 89%, PR 81%, HER-2 negative, Ki-67 79% T2 N1 A. stage IIB   12/13/2011 - 06/28/2012 Chemotherapy   4 cycles of FEC followed by 4 cycles of Taxotere   07/17/2012 - 08/22/2012 Radiation Therapy   Adjuvant radiation therapy   08/22/2012 - 03/16/2016 Anti-estrogen oral therapy   Arimidex 1 mg daily   03/16/2016 Relapse/Recurrence   Subcutaneous nodule excision left chest: Infiltrating carcinoma breast primary, ER positive, PR negative   03/29/2016 Imaging   CT CAP and bone scan: Lytic lesions T8 vertebral, T1 posterior element, subcutaneous nodule left lateral chest wall, nonspecific lung nodules; Bone scan: Mets to kull, left humerus, left eighth rib, T7/T8, sternum, left acetabulum   04/28/2016 - 06/17/2017 Chemotherapy   Herceptin, lapatinib, Faslodex, Zometa every 4 weeks, lapatinib discontinued in September 2018 due to elevation of LFTs   06/22/2017 Relapse/Recurrence   Surgical excision:Soft tissue mass left lateral chest wall primary breast cancer, soft tissue mass left medial chest wall breast cancer, tumor is within the dermis extending to the subcutaneous adipose tissue and involves portions of skeletal muscle   08/2017 - 02/16/2018 Chemotherapy   Faslodex with Herceptin and Perjeta along with Zometa every 4 weeks    03/02/2018 - 05/25/2018 Chemotherapy   Kadcyla    05/11/2018 Imaging   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 Associated vasogenic edema within the underlying  right cerebral hemisphere without significant midline shift. Signal abnormality throughout the visualized bone marrow, compatible with osseous metastatic disease.      06/04/2018 Imaging   CT CAP: Right lower lobe lung nodule 7 mm (was 5 mm); multiple bone metastases throughout the spine and ribs sternum scapula and humerus, slightly increased lower thoracic mets, right renal lesion 2.5 cm (was 1.2 cm) right femur met increased from 2.1 cm to 2.9 cm   06/15/2018 -  Anti-estrogen oral therapy   Abemaciclib, Herceptin, letrozole, Xgeva   06/07/2019 Imaging   Progression of brain metastases,2 discrete dural-based lesions involving the posterior right frontal lobe. The more anterior lesion now measures 16.5 x 17.5 x 14 mm. The posterior lesion now measures 9.5 x 13 x 20 mm.  Vasogenic edema of the frontal and parietal lobes   06/21/2019 - 06/27/2019 Radiation Therapy   SRS to the brain   Metastatic breast cancer ([name] Riley)  06/29/2017 Initial Diagnosis   Metastatic breast cancer ([name] Riley)   06/15/2018 -  Chemotherapy   The patient had trastuzumab (HERCEPTIN) 546 mg in sodium chloride 0.9 % 250 mL chemo infusion, 8 mg/kg = 546 mg, Intravenous,  Once, 13 of 13 cycles Administration: 546 mg (06/15/2018), 399 mg (09/07/2018), 399 mg (07/13/2018), 399 mg (10/12/2018), 399 mg (11/02/2018), 399 mg (11/30/2018), 399 mg (01/24/2019), 399 mg (12/28/2018), 399 mg (02/22/2019), 399 mg (03/22/2019), 399 mg (04/17/2019), 399 mg (08/10/2018), 399 mg (05/17/2019) trastuzumab-dkst (OGIVRI) 399 mg in sodium chloride 0.9 % 250 mL chemo infusion, 6 mg/kg = 399 mg (100 % of original dose 6 mg/kg), Intravenous,  Once, 1 of 7 cycles Dose modification: 6 mg/kg (  original dose 6 mg/kg, Cycle 14, Reason: Other (see comments), Comment: Biosimilar Conversion) Administration: 399 mg (06/14/2019)  for chemotherapy treatment.      CHIEF COMPLIANT: Follow-up on Herceptin with abemaciclib, letrozole, andXgeva to review recent scans  INTERVAL HISTORY:  [name] Riley is a 68 y.o. with above-mentioned history of metastatic breast cancer currently on abemaciclib with letrozole along with Herceptin Xgeva.Echo on 04/04/19 showed an ejection fraction of 55-60%. CT CAP on 07/05/19 showed widespread osseous metastatic disease and no additional metastases. Bone scan on 07/05/19 showed widespread skeletal metastatic lesions without change from her last scan on 12/24/18. She presents to the clinic alone today for treatment and to review her scans.  She is feeling significantly better since stereotactic radiation.  Her balance issues have improved.  Clumsiness in the hands is also improved.  REVIEW OF SYSTEMS:   Constitutional: Denies fevers, chills or abnormal weight loss Eyes: Denies blurriness of vision Ears, nose, mouth, throat, and face: Denies mucositis or sore throat Respiratory: Denies cough, dyspnea or wheezes Cardiovascular: Denies palpitation, chest discomfort Gastrointestinal: Denies nausea, heartburn or change in bowel habits Skin: Denies abnormal skin rashes Lymphatics: Denies new lymphadenopathy or easy bruising Neurological: Denies numbness, tingling or new weaknesses Behavioral/Psych: Mood is stable, no new changes  Extremities: No lower extremity edema Breast: denies any pain or lumps or nodules in either breasts All other systems were reviewed with the patient and are negative.  I have reviewed the past medical history, past surgical history, social history and family history with the patient and they are unchanged from previous note.  ALLERGIES:  is allergic to cantaloupe extract allergy skin test; contrast media [iodinated diagnostic agents]; pravastatin; and zosyn [piperacillin sod-tazobactam so].  MEDICATIONS:  Current Outpatient Medications  Medication Sig Dispense Refill  . acetaminophen (TYLENOL) 500 MG tablet Take 1,000 mg by mouth every 6 (six) hours as needed for moderate pain.    . cholecalciferol (VITAMIN D) 1000 units  tablet Take 1 tablet (1,000 Units total) by mouth daily.    Marland Kitchen dexamethasone (DECADRON) 1 MG tablet Take #1m in AM for 7 days, then #167min AM for 7 days, then stop therapy 21 tablet 0  . diphenhydrAMINE (BENADRYL) 25 MG tablet Take 50 mg by mouth at bedtime as needed for sleep.    . Marland Kitchenetrozole (FEMARA) 2.5 MG tablet TAKE ONE (1) TABLET EACH DAY 90 tablet 3  . levETIRAcetam (KEPPRA) 500 MG tablet TAKE ONE TABLET BY MOUTH TWICE DAILY 60 tablet 2  . levothyroxine (SYNTHROID) 100 MCG tablet Take 1 tablet (100 mcg total) by mouth daily before breakfast. BRAND ONLY 90 tablet 1  . lidocaine-prilocaine (EMLA) cream APPLY TOPICALLY AS NEEDED FOR PORT ACCESS 30 g 3  . metoprolol succinate (TOPROL-XL) 100 MG 24 hr tablet Take 1 tablet (100 mg total) by mouth daily. 90 tablet 3  . Multiple Vitamins-Minerals (MULTIVITAMIN WITH MINERALS) tablet Take 1 tablet by mouth daily.      . ondansetron (ZOFRAN-ODT) 4 MG disintegrating tablet Take 1 tablet (4 mg total) by mouth every 6 (six) hours as needed for nausea or vomiting. 30 tablet 3  . predniSONE (DELTASONE) 50 MG tablet Take 1 5026mablet by mouth 13 hrs, 7hrs, and 1 hr prior to CT Scan. (CT dye allergy protocol) 3 tablet 0  . rosuvastatin (CRESTOR) 5 MG tablet Take 1 tablet (5 mg total) by mouth 2 (two) times a week. For cholesterol 26 tablet 3  . VERZENIO 150 MG tablet TAKE 1 TABLET (150 MG  TOTAL) BY MOUTH 2 (TWO) TIMES DAILY. SWALLOW TABLETS WHOLE. DO NOT CHEW, CRUSH, OR SPLIT TABLETS BEFORE SWALLOWING. 56 tablet 1   No current facility-administered medications for this visit.    Facility-Administered Medications Ordered in Other Visits  Medication Dose Route Frequency Provider Last Rate Last Dose  . 0.9 %  sodium chloride infusion   Intravenous Continuous Donnie Mesa, MD      . acetaminophen (TYLENOL) tablet 975 mg  975 mg Oral Q6H Donnie Mesa, MD      . enoxaparin (LOVENOX) injection 40 mg  40 mg Subcutaneous Q24H Donnie Mesa, MD      . morphine  4 MG/ML injection 2-4 mg  2-4 mg Intravenous Q2H PRN Donnie Mesa, MD      . ondansetron (ZOFRAN-ODT) disintegrating tablet 4 mg  4 mg Oral Q6H PRN Donnie Mesa, MD       Or  . ondansetron (ZOFRAN) 4 mg in sodium chloride 0.9 % 50 mL IVPB  4 mg Intravenous Q6H PRN Donnie Mesa, MD      . oxyCODONE (Oxy IR/ROXICODONE) immediate release tablet 5-10 mg  5-10 mg Oral Q4H PRN Donnie Mesa, MD      . piperacillin-tazobactam (ZOSYN) 3.375 g in dextrose 5 % 50 mL IVPB  3.375 g Intravenous Q8H Donnie Mesa, MD      . prochlorperazine (COMPAZINE) tablet 10 mg  10 mg Oral Q6H PRN Donnie Mesa, MD       Or  . prochlorperazine (COMPAZINE) injection 5-10 mg  5-10 mg Intravenous Q6H PRN Donnie Mesa, MD        PHYSICAL EXAMINATION: ECOG PERFORMANCE STATUS: 1 - Symptomatic but completely ambulatory  Vitals:   07/12/19 0827  BP: (!) 161/81  Pulse: 77  Resp: 18  Temp: 98.5 F (36.9 C)  SpO2: 100%   Filed Weights   07/12/19 0827  Weight: 144 lb 1.6 oz (65.4 kg)    GENERAL: alert, no distress and comfortable SKIN: skin color, texture, turgor are normal, no rashes or significant lesions EYES: normal, Conjunctiva are pink and non-injected, sclera clear OROPHARYNX: no exudate, no erythema and lips, buccal mucosa, and tongue normal  NECK: supple, thyroid normal size, non-tender, without nodularity LYMPH: no palpable lymphadenopathy in the cervical, axillary or inguinal LUNGS: clear to auscultation and percussion with normal breathing effort HEART: regular rate & rhythm and no murmurs and no lower extremity edema ABDOMEN: abdomen soft, non-tender and normal bowel sounds MUSCULOSKELETAL: no cyanosis of digits and no clubbing  NEURO: alert & oriented x 3 with fluent speech, no focal motor/sensory deficits EXTREMITIES: No lower extremity edema  LABORATORY DATA:  I have reviewed the data as listed CMP Latest Ref Rng & Units 06/14/2019 05/17/2019 04/24/2019  Glucose 70 - 99 mg/dL 105(H)  118(H) 81  BUN 8 - 23 mg/dL 21 15 9   Creatinine 0.44 - 1.00 mg/dL 0.97 0.94 0.95  Sodium 135 - 145 mmol/L 141 142 141  Potassium 3.5 - 5.1 mmol/L 3.4(L) 3.5 3.8  Chloride 98 - 111 mmol/L 104 105 100  CO2 22 - 32 mmol/L 28 29 25   Calcium 8.9 - 10.3 mg/dL 9.4 9.6 10.0  Total Protein 6.5 - 8.1 g/dL 6.7 6.7 6.9  Total Bilirubin 0.3 - 1.2 mg/dL 0.5 0.7 0.4  Alkaline Phos 38 - 126 U/L 149(H) 149(H) 161(H)  AST 15 - 41 U/L 96(H) 44(H) 54(H)  ALT 0 - 44 U/L 97(H) 32 42(H)    Lab Results  Component Value Date   WBC 2.5 (L)  07/12/2019   HGB 10.2 (L) 07/12/2019   HCT 31.0 (L) 07/12/2019   MCV 105.8 (H) 07/12/2019   PLT 66 (L) 07/12/2019   NEUTROABS 1.1 (L) 07/12/2019    ASSESSMENT & PLAN:  Breast cancer of upper-outer quadrant of left female breast ([place]) Left breast invasive ductal carcinomaT2, N1, 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 Was on Arimidex since 08/22/2012 to 03/30/16 Recent treatment: Kadcyla Subcutaneous nodule excisionleft chest: Infiltrating carcinoma breast primary, ER positive, PR negative, HER-2 positive  06/04/2018:3/9/2020andBone scan: Progression of metastatic disease involving the bone, renal lesion and lung nodule August 2020: Progression of brain metastases with vasogenic edema,  September 2020 stereotactic radiation seen Dr. Mickeal Skinner  Current treatment: Abemaciclibwith letrozole along with Herceptin(to be given every 4 weeks)and Xgeva CT CAP and bone scan 07/05/2019: Widespread bone metastases as before.  Stable disease no new lesions Radiology review: Continue with the current treatment.  Lab review: ANC 1.1, platelets 66, continue with the same treatment plan. Recheck scans every  6 months.  I will see her back in 3 months and she will get monthly Herceptin injections.   No orders of the defined types  were placed in this encounter.  The patient has a good understanding of the overall plan. she agrees with it. she will call with any problems that may develop before the next visit here.  Nicholas Lose, MD 07/12/2019  Julious Oka Dorshimer am acting as scribe for Dr. Nicholas Lose.  I have reviewed the above documentation for accuracy and completeness, and I agree with the above.

## 2019-07-12 ENCOUNTER — Other Ambulatory Visit: Payer: Self-pay | Admitting: Hematology and Oncology

## 2019-07-12 ENCOUNTER — Inpatient Hospital Stay: Payer: Medicare Other | Attending: Hematology and Oncology

## 2019-07-12 ENCOUNTER — Inpatient Hospital Stay: Payer: Medicare Other

## 2019-07-12 ENCOUNTER — Other Ambulatory Visit: Payer: Self-pay

## 2019-07-12 ENCOUNTER — Inpatient Hospital Stay (HOSPITAL_BASED_OUTPATIENT_CLINIC_OR_DEPARTMENT_OTHER): Payer: Medicare Other | Admitting: Hematology and Oncology

## 2019-07-12 DIAGNOSIS — Z170 Estrogen receptor positive status [ER+]: Secondary | ICD-10-CM

## 2019-07-12 DIAGNOSIS — C7951 Secondary malignant neoplasm of bone: Secondary | ICD-10-CM | POA: Insufficient documentation

## 2019-07-12 DIAGNOSIS — C7931 Secondary malignant neoplasm of brain: Secondary | ICD-10-CM | POA: Insufficient documentation

## 2019-07-12 DIAGNOSIS — C50412 Malignant neoplasm of upper-outer quadrant of left female breast: Secondary | ICD-10-CM

## 2019-07-12 DIAGNOSIS — Z79899 Other long term (current) drug therapy: Secondary | ICD-10-CM | POA: Diagnosis not present

## 2019-07-12 DIAGNOSIS — Z9221 Personal history of antineoplastic chemotherapy: Secondary | ICD-10-CM | POA: Insufficient documentation

## 2019-07-12 DIAGNOSIS — Z5112 Encounter for antineoplastic immunotherapy: Secondary | ICD-10-CM | POA: Insufficient documentation

## 2019-07-12 DIAGNOSIS — Z95828 Presence of other vascular implants and grafts: Secondary | ICD-10-CM

## 2019-07-12 DIAGNOSIS — C50919 Malignant neoplasm of unspecified site of unspecified female breast: Secondary | ICD-10-CM

## 2019-07-12 DIAGNOSIS — I25118 Atherosclerotic heart disease of native coronary artery with other forms of angina pectoris: Secondary | ICD-10-CM | POA: Diagnosis not present

## 2019-07-12 DIAGNOSIS — Z923 Personal history of irradiation: Secondary | ICD-10-CM | POA: Insufficient documentation

## 2019-07-12 DIAGNOSIS — Z9013 Acquired absence of bilateral breasts and nipples: Secondary | ICD-10-CM | POA: Diagnosis not present

## 2019-07-12 DIAGNOSIS — Z79811 Long term (current) use of aromatase inhibitors: Secondary | ICD-10-CM | POA: Insufficient documentation

## 2019-07-12 LAB — CBC WITH DIFFERENTIAL (CANCER CENTER ONLY)
Abs Immature Granulocytes: 0.01 10*3/uL (ref 0.00–0.07)
Basophils Absolute: 0 10*3/uL (ref 0.0–0.1)
Basophils Relative: 1 %
Eosinophils Absolute: 0.1 10*3/uL (ref 0.0–0.5)
Eosinophils Relative: 2 %
HCT: 31 % — ABNORMAL LOW (ref 36.0–46.0)
Hemoglobin: 10.2 g/dL — ABNORMAL LOW (ref 12.0–15.0)
Immature Granulocytes: 0 %
Lymphocytes Relative: 45 %
Lymphs Abs: 1.1 10*3/uL (ref 0.7–4.0)
MCH: 34.8 pg — ABNORMAL HIGH (ref 26.0–34.0)
MCHC: 32.9 g/dL (ref 30.0–36.0)
MCV: 105.8 fL — ABNORMAL HIGH (ref 80.0–100.0)
Monocytes Absolute: 0.2 10*3/uL (ref 0.1–1.0)
Monocytes Relative: 9 %
Neutro Abs: 1.1 10*3/uL — ABNORMAL LOW (ref 1.7–7.7)
Neutrophils Relative %: 43 %
Platelet Count: 66 10*3/uL — ABNORMAL LOW (ref 150–400)
RBC: 2.93 MIL/uL — ABNORMAL LOW (ref 3.87–5.11)
RDW: 14.6 % (ref 11.5–15.5)
WBC Count: 2.5 10*3/uL — ABNORMAL LOW (ref 4.0–10.5)
nRBC: 0 % (ref 0.0–0.2)

## 2019-07-12 LAB — CMP (CANCER CENTER ONLY)
ALT: 80 U/L — ABNORMAL HIGH (ref 0–44)
AST: 60 U/L — ABNORMAL HIGH (ref 15–41)
Albumin: 3.7 g/dL (ref 3.5–5.0)
Alkaline Phosphatase: 178 U/L — ABNORMAL HIGH (ref 38–126)
Anion gap: 9 (ref 5–15)
BUN: 20 mg/dL (ref 8–23)
CO2: 28 mmol/L (ref 22–32)
Calcium: 9.4 mg/dL (ref 8.9–10.3)
Chloride: 104 mmol/L (ref 98–111)
Creatinine: 0.89 mg/dL (ref 0.44–1.00)
GFR, Est AFR Am: >60 mL/min (ref >60)
GFR, Estimated: >60 mL/min (ref >60)
Glucose, Bld: 111 mg/dL — ABNORMAL HIGH (ref 70–99)
Potassium: 3.5 mmol/L (ref 3.5–5.1)
Sodium: 141 mmol/L (ref 135–145)
Total Bilirubin: 0.5 mg/dL (ref 0.3–1.2)
Total Protein: 6.5 g/dL (ref 6.5–8.1)

## 2019-07-12 MED ORDER — SODIUM CHLORIDE 0.9% FLUSH
10.0000 mL | Freq: Once | INTRAVENOUS | Status: AC
  Administered 2019-07-12: 08:00:00 10 mL
  Filled 2019-07-12: qty 10

## 2019-07-12 MED ORDER — HEPARIN SOD (PORK) LOCK FLUSH 100 UNIT/ML IV SOLN
500.0000 [IU] | Freq: Once | INTRAVENOUS | Status: AC | PRN
  Administered 2019-07-12: 11:00:00 500 [IU]
  Filled 2019-07-12: qty 5

## 2019-07-12 MED ORDER — ACETAMINOPHEN 325 MG PO TABS
ORAL_TABLET | ORAL | Status: AC
  Filled 2019-07-12: qty 2

## 2019-07-12 MED ORDER — DENOSUMAB 120 MG/1.7ML ~~LOC~~ SOLN
SUBCUTANEOUS | Status: AC
  Filled 2019-07-12: qty 1.7

## 2019-07-12 MED ORDER — DIPHENHYDRAMINE HCL 25 MG PO CAPS
50.0000 mg | ORAL_CAPSULE | Freq: Once | ORAL | Status: AC
  Administered 2019-07-12: 09:00:00 50 mg via ORAL

## 2019-07-12 MED ORDER — SODIUM CHLORIDE 0.9 % IV SOLN
Freq: Once | INTRAVENOUS | Status: AC
  Administered 2019-07-12: 09:00:00 via INTRAVENOUS
  Filled 2019-07-12: qty 250

## 2019-07-12 MED ORDER — SODIUM CHLORIDE 0.9% FLUSH
10.0000 mL | INTRAVENOUS | Status: DC | PRN
  Administered 2019-07-12: 10 mL
  Filled 2019-07-12: qty 10

## 2019-07-12 MED ORDER — DENOSUMAB 120 MG/1.7ML ~~LOC~~ SOLN
120.0000 mg | Freq: Once | SUBCUTANEOUS | Status: AC
  Administered 2019-07-12: 12:00:00 120 mg via SUBCUTANEOUS

## 2019-07-12 MED ORDER — TRASTUZUMAB-DKST CHEMO 150 MG IV SOLR
6.0000 mg/kg | Freq: Once | INTRAVENOUS | Status: AC
  Administered 2019-07-12: 10:00:00 399 mg via INTRAVENOUS
  Filled 2019-07-12: qty 19

## 2019-07-12 MED ORDER — DIPHENHYDRAMINE HCL 25 MG PO CAPS
ORAL_CAPSULE | ORAL | Status: AC
  Filled 2019-07-12: qty 2

## 2019-07-12 MED ORDER — ACETAMINOPHEN 325 MG PO TABS
650.0000 mg | ORAL_TABLET | Freq: Once | ORAL | Status: AC
  Administered 2019-07-12: 09:00:00 650 mg via ORAL

## 2019-07-12 NOTE — Assessment & Plan Note (Addendum)
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 Was on Arimidex since 08/22/2012 to 03/30/16 Recent treatment: Kadcyla Subcutaneous nodule excisionleft chest: Infiltrating carcinoma breast primary, ER positive, PR negative, HER-2 positive  06/04/2018:3/9/2020andBone scan: Progression of metastatic disease involving the bone, renal lesion and lung nodule August 2020: Progression of brain metastases with vasogenic edema,  September 2020 stereotactic radiation seen Dr. Mickeal Skinner  Current treatment: Abemaciclibwith letrozole along with Herceptin(to be given every 4 weeks)and Xgeva CT CAP 07/05/2019: Widespread bone metastases as before.  Stable disease no new lesions Radiology review: Continue with the current treatment. Recheck scans every 3 to 6 months.

## 2019-07-12 NOTE — Telephone Encounter (Signed)
Oral Oncology Patient Advocate Encounter  I met with the patient while she was in the office today. She signed the renewal application and will either email me or tax return or bring it the next time she comes in.  I will fax this information when the renewal period starts with [employer].  [place] Patient [place] Phone [phone] Fax [phone] 07/12/2019   9:08 AM

## 2019-07-12 NOTE — Patient Instructions (Signed)
[facility] Discharge Instructions for Patients Receiving Chemotherapy  Today you received the following chemotherapy agents Trastuzumab (OGIVRI).  To help prevent nausea and vomiting after your treatment, we encourage you to take your nausea medication as prescribed.   If you develop nausea and vomiting that is not controlled by your nausea medication, call the clinic.   BELOW ARE SYMPTOMS THAT SHOULD BE REPORTED IMMEDIATELY:  *FEVER GREATER THAN 100.5 F  *CHILLS WITH OR WITHOUT FEVER  NAUSEA AND VOMITING THAT IS NOT CONTROLLED WITH YOUR NAUSEA MEDICATION  *UNUSUAL SHORTNESS OF BREATH  *UNUSUAL BRUISING OR BLEEDING  TENDERNESS IN MOUTH AND THROAT WITH OR WITHOUT PRESENCE OF ULCERS  *URINARY PROBLEMS  *BOWEL PROBLEMS  UNUSUAL RASH Items with * indicate a potential emergency and should be followed up as soon as possible.  Feel free to call the clinic should you have any questions or concerns. The clinic phone number is [phone].  Please show the CHEMO ALERT CARD at check-in to the Emergency Department and triage nurse.  Coronavirus (COVID-19) Are you at risk?  Are you at risk for the Coronavirus (COVID-19)?  To be considered HIGH RISK for Coronavirus (COVID-19), you have to meet the following criteria:  . Traveled to [place], [place], [name], [place] or [place]; or in the [place] to [place], [place], [place], or [place]; and have fever, cough, and shortness of breath within the last 2 weeks of travel OR . Been in close contact with a person diagnosed with COVID-19 within the last 2 weeks and have fever, cough, and shortness of breath . IF YOU DO NOT MEET THESE CRITERIA, YOU ARE CONSIDERED LOW RISK FOR COVID-19.  What to do if you are HIGH RISK for COVID-19?  . If you are having a medical emergency, call 911. . Seek medical care right away. Before you go to a doctor's office, urgent care or emergency department, call ahead and tell them  about your recent travel, contact with someone diagnosed with COVID-19, and your symptoms. You should receive instructions from your physician's office regarding next steps of care.  . When you arrive at healthcare provider, tell the healthcare staff immediately you have returned from visiting [place], [place], [place], [place] or [name]; or traveled in the [place] to [place], [place], [place], or [place]; in the last two weeks or you have been in close contact with a person diagnosed with COVID-19 in the last 2 weeks.   . Tell the health care staff about your symptoms: fever, cough and shortness of breath. . After you have been seen by a medical provider, you will be either: o Tested for (COVID-19) and discharged home on quarantine except to seek medical care if symptoms worsen, and asked to  - Stay home and avoid contact with others until you get your results (4-5 days)  - Avoid travel on public transportation if possible (such as bus, train, or airplane) or o Sent to the Emergency Department by EMS for evaluation, COVID-19 testing, and possible admission depending on your condition and test results.  What to do if you are LOW RISK for COVID-19?  Reduce your risk of any infection by using the same precautions used for avoiding the common cold or flu:  . Wash your hands often with soap and warm water for at least 20 seconds.  If soap and water are not readily available, use an alcohol-based hand sanitizer with at least 60% alcohol.  . If coughing or   sneezing, cover your mouth and nose by coughing or sneezing into the elbow areas of your shirt or coat, into a tissue or into your sleeve (not your hands). . Avoid shaking hands with others and consider head nods or verbal greetings only. . Avoid touching your eyes, nose, or mouth with unwashed hands.  . Avoid close contact with people who are sick. . Avoid places or events with large numbers of people in one location, like concerts or  sporting events. . Carefully consider travel plans you have or are making. . If you are planning any travel outside or inside the [name], visit the CDC's Travelers' Health webpage for the latest health notices. . If you have some symptoms but not all symptoms, continue to monitor at home and seek medical attention if your symptoms worsen. . If you are having a medical emergency, call 911.   [place] / e-Visit: [email or web address]         [facility]: [facility]: [phone]                   [facility]: [phone]

## 2019-07-15 ENCOUNTER — Telehealth: Payer: Self-pay | Admitting: Hematology and Oncology

## 2019-07-15 NOTE — Telephone Encounter (Signed)
I talk with patient regarding schedule  

## 2019-07-29 ENCOUNTER — Encounter: Payer: Self-pay | Admitting: Family Medicine

## 2019-07-29 ENCOUNTER — Ambulatory Visit: Payer: Self-pay | Admitting: Radiation Oncology

## 2019-07-29 ENCOUNTER — Ambulatory Visit (INDEPENDENT_AMBULATORY_CARE_PROVIDER_SITE_OTHER): Payer: Medicare Other | Admitting: Family Medicine

## 2019-07-29 DIAGNOSIS — I10 Essential (primary) hypertension: Secondary | ICD-10-CM

## 2019-07-29 DIAGNOSIS — E039 Hypothyroidism, unspecified: Secondary | ICD-10-CM

## 2019-07-29 DIAGNOSIS — I25118 Atherosclerotic heart disease of native coronary artery with other forms of angina pectoris: Secondary | ICD-10-CM

## 2019-07-29 MED ORDER — VALSARTAN 80 MG PO TABS: 80.0000 mg | ORAL_TABLET | Freq: Every day | ORAL | 2 refills | Status: DC

## 2019-07-29 NOTE — Progress Notes (Signed)
Subjective:    Patient ID: [name] Riley, female    DOB: June 10, 1951, 69 y.o.   MRN: [id number]   HPI: [name] Riley is a 68 y.o. female presenting for  foll.wdsow-up on  thyroid. The patient has a history of hypothyroidism for many years. It has been stable recently. Pt. denies any change in  voice, loss of hair, heat or cold intolerance. Energy level has been adequate to good. Patient denies constipation and diarrhea. No myxedema. Medication is as noted below. Verified that pt is taking it daily on an empty stomach. Well tolerated.   presents for  follow-up of hypertension. Patient has no history of headache chest pain or shortness of breath or recent cough. Patient also denies symptoms of TIA such as focal numbness or weakness. Patient denies side effects from medication. States taking it regularly. Has noted multiple systolic readings in the 993-716 range.    Depression screen PHQ 2/9 04/24/2019  Decreased Interest 0  Down, Depressed, Hopeless 0  PHQ - 2 Score 0  Some recent data might be hidden     Relevant past medical, surgical, family and social history reviewed and updated as indicated.  Interim medical history since our last visit reviewed. Allergies and medications reviewed and updated.  ROS:  Review of Systems  Constitutional: Negative.   HENT: Negative for congestion.   Eyes: Negative for visual disturbance.  Respiratory: Negative for shortness of breath.   Cardiovascular: Negative for chest pain.  Gastrointestinal: Negative for abdominal pain, constipation, diarrhea, nausea and vomiting.  Genitourinary: Negative for difficulty urinating.  Musculoskeletal: Negative for arthralgias and myalgias.  Neurological: Negative for headaches.  Psychiatric/Behavioral: Negative for sleep disturbance.     Social History   Tobacco Use  Smoking Status Never Smoker  Smokeless Tobacco Never Used       Objective:     Wt Readings from Last 3 Encounters:  07/12/19 144 lb  1.6 oz (65.4 kg)  06/17/19 136 lb 12.8 oz (62.1 kg)  06/13/19 134 lb (60.8 kg)     Exam deferred. Pt. Harboring due to COVID 19. Phone visit performed.   Assessment & Plan:   1. Hypothyroidism, unspecified type   2. Essential hypertension     Meds ordered this encounter  Medications  . valsartan (DIOVAN) 80 MG tablet    Sig: Take 1 tablet (80 mg total) by mouth daily. For blood pressure    Dispense:  30 tablet    Refill:  2    Orders Placed This Encounter  Procedures  . TSH + free T4  . BMP8+EGFR    Order Specific Question:   Has the patient fasted?    Answer:   No      Diagnoses and all orders for this visit:  Hypothyroidism, unspecified type -     TSH + free T4 -     BMP8+EGFR  Essential hypertension -     TSH + free T4 -     BMP8+EGFR  Other orders -     valsartan (DIOVAN) 80 MG tablet; Take 1 tablet (80 mg total) by mouth daily. For blood pressure    Virtual Visit via telephone Note  I discussed the limitations, risks, security and privacy concerns of performing an evaluation and management service by telephone and the availability of in person appointments. The patient was identified with two identifiers. Pt.expressed understanding and agreed to proceed. Pt. Is at home. Dr. Livia Snellen is in his office.  Follow Up Instructions:  I discussed the assessment and treatment plan with the patient. The patient was provided an opportunity to ask questions and all were answered. The patient agreed with the plan and demonstrated an understanding of the instructions.   The patient was advised to call back or seek an in-person evaluation if the symptoms worsen or if the condition fails to improve as anticipated.   Total minutes including chart review and phone contact time: 32   Follow up plan: No follow-ups on file.  [name], MD [facility]

## 2019-07-30 ENCOUNTER — Telehealth: Payer: Self-pay | Admitting: Radiation Oncology

## 2019-07-30 ENCOUNTER — Ambulatory Visit: Payer: Medicare Other | Admitting: Radiation Oncology

## 2019-07-30 ENCOUNTER — Other Ambulatory Visit: Payer: Medicare Other

## 2019-07-30 ENCOUNTER — Other Ambulatory Visit: Payer: Self-pay

## 2019-07-30 NOTE — Telephone Encounter (Signed)
  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  Interval Since Last Radiation:  5 weeks    06/21/2019-06/27/2019 SRS Treatment: PTV1 Posterior Right Frontal 17 mm target was treated to 27 Gy in 3 fractions  07/17/12-08/22/12:  The left chest wall and regional nodes were treated.  Narrative:  The patient was contacted today for routine follow-up. During treatment she did very well with radiotherapy and did not have significant desquamation. She reports she is doing well. Her scalp is sore and she had some patchy hair loss. She has had some occasional nose bleeds and is concerned about her plt count. She reports good energy and occasional headaches.  Impression/Plan: 1. 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 The patient has been doing well since completion of radiotherapy. We discussed that we would be happy to continue to follow her and get an MRI of the brain in about 2 months. She  will also continue to follow up with Dr. Lindi Adie in medical oncology. She was counseled on skin care as well as measures to avoid sun exposure to this area.  2. Thrombocytopenia and occasional epistaxis. I encouraged the patient to avoid using sharp cutting instruments and to try saline nasal sprays as well prn to avoid nosebleeds. We will follow this expectantly as she continues with systemic treatment with Dr. Lindi Adie.    Carola Rhine, PAC

## 2019-07-31 LAB — BMP8+EGFR
BUN/Creatinine Ratio: 17 (ref 12–28)
BUN: 14 mg/dL (ref 8–27)
CO2: 25 mmol/L (ref 20–29)
Calcium: 9.5 mg/dL (ref 8.7–10.3)
Chloride: 102 mmol/L (ref 96–106)
Creatinine, Ser: 0.84 mg/dL (ref 0.57–1.00)
GFR calc Af Amer: 84 mL/min/{1.73_m2} (ref >59)
GFR calc non Af Amer: 73 mL/min/{1.73_m2} (ref >59)
Glucose: 108 mg/dL — ABNORMAL HIGH (ref 65–99)
Potassium: 3.6 mmol/L (ref 3.5–5.2)
Sodium: 141 mmol/L (ref 134–144)

## 2019-07-31 LAB — TSH+FREE T4
Free T4: 1.49 ng/dL (ref 0.82–1.77)
TSH: 1.55 u[IU]/mL (ref 0.450–4.500)

## 2019-07-31 NOTE — Progress Notes (Signed)
Hello [name],  Your lab result is normal and/or stable.Some minor variations that are not significant are commonly marked abnormal, but do not represent any medical problem for you.  Best regards, [name], M.D.

## 2019-08-01 ENCOUNTER — Other Ambulatory Visit: Payer: Self-pay

## 2019-08-02 ENCOUNTER — Ambulatory Visit (INDEPENDENT_AMBULATORY_CARE_PROVIDER_SITE_OTHER): Payer: Medicare Other

## 2019-08-02 DIAGNOSIS — Z23 Encounter for immunization: Secondary | ICD-10-CM

## 2019-08-09 ENCOUNTER — Inpatient Hospital Stay: Payer: Medicare Other | Attending: Hematology and Oncology

## 2019-08-09 ENCOUNTER — Other Ambulatory Visit: Payer: Self-pay

## 2019-08-09 VITALS — BP 137/81 | HR 71 | Temp 98.3°F | Resp 20

## 2019-08-09 DIAGNOSIS — C50412 Malignant neoplasm of upper-outer quadrant of left female breast: Secondary | ICD-10-CM | POA: Diagnosis not present

## 2019-08-09 DIAGNOSIS — C50919 Malignant neoplasm of unspecified site of unspecified female breast: Secondary | ICD-10-CM

## 2019-08-09 DIAGNOSIS — Z170 Estrogen receptor positive status [ER+]: Secondary | ICD-10-CM | POA: Insufficient documentation

## 2019-08-09 DIAGNOSIS — Z5112 Encounter for antineoplastic immunotherapy: Secondary | ICD-10-CM | POA: Insufficient documentation

## 2019-08-09 MED ORDER — ACETAMINOPHEN 325 MG PO TABS
ORAL_TABLET | ORAL | Status: AC
  Filled 2019-08-09: qty 2

## 2019-08-09 MED ORDER — DIPHENHYDRAMINE HCL 25 MG PO CAPS
ORAL_CAPSULE | ORAL | Status: AC
  Filled 2019-08-09: qty 1

## 2019-08-09 MED ORDER — TRASTUZUMAB-DKST CHEMO 150 MG IV SOLR
6.0000 mg/kg | Freq: Once | INTRAVENOUS | Status: AC
  Administered 2019-08-09: 399 mg via INTRAVENOUS
  Filled 2019-08-09: qty 19

## 2019-08-09 MED ORDER — DIPHENHYDRAMINE HCL 25 MG PO CAPS
50.0000 mg | ORAL_CAPSULE | Freq: Once | ORAL | Status: AC
  Administered 2019-08-09: 50 mg via ORAL

## 2019-08-09 MED ORDER — SODIUM CHLORIDE 0.9% FLUSH
10.0000 mL | INTRAVENOUS | Status: DC | PRN
  Administered 2019-08-09: 10 mL
  Filled 2019-08-09: qty 10

## 2019-08-09 MED ORDER — HEPARIN SOD (PORK) LOCK FLUSH 100 UNIT/ML IV SOLN
500.0000 [IU] | Freq: Once | INTRAVENOUS | Status: AC | PRN
  Administered 2019-08-09: 500 [IU]
  Filled 2019-08-09: qty 5

## 2019-08-09 MED ORDER — ACETAMINOPHEN 325 MG PO TABS
650.0000 mg | ORAL_TABLET | Freq: Once | ORAL | Status: AC
  Administered 2019-08-09: 650 mg via ORAL

## 2019-08-09 MED ORDER — SODIUM CHLORIDE 0.9 % IV SOLN
Freq: Once | INTRAVENOUS | Status: AC
  Administered 2019-08-09: 15:00:00 via INTRAVENOUS
  Filled 2019-08-09: qty 250

## 2019-08-09 NOTE — Patient Instructions (Signed)
[facility] °Discharge Instructions for Patients Receiving Chemotherapy ° °Today you received the following chemotherapy agents Trastuzumab ° °To help prevent nausea and vomiting after your treatment, we encourage you to take your nausea medication as directed. °  °If you develop nausea and vomiting that is not controlled by your nausea medication, call the clinic.  ° °BELOW ARE SYMPTOMS THAT SHOULD BE REPORTED IMMEDIATELY: °· *FEVER GREATER THAN 100.5 F °· *CHILLS WITH OR WITHOUT FEVER °· NAUSEA AND VOMITING THAT IS NOT CONTROLLED WITH YOUR NAUSEA MEDICATION °· *UNUSUAL SHORTNESS OF BREATH °· *UNUSUAL BRUISING OR BLEEDING °· TENDERNESS IN MOUTH AND THROAT WITH OR WITHOUT PRESENCE OF ULCERS °· *URINARY PROBLEMS °· *BOWEL PROBLEMS °· UNUSUAL RASH °Items with * indicate a potential emergency and should be followed up as soon as possible. ° °Feel free to call the clinic should you have any questions or concerns. The clinic phone number is [phone]. ° °Please show the CHEMO ALERT CARD at check-in to the Emergency Department and triage nurse. ° ° °

## 2019-08-29 ENCOUNTER — Other Ambulatory Visit: Payer: Self-pay | Admitting: Radiation Therapy

## 2019-08-29 ENCOUNTER — Telehealth: Payer: Self-pay | Admitting: Radiation Therapy

## 2019-08-29 DIAGNOSIS — C7931 Secondary malignant neoplasm of brain: Secondary | ICD-10-CM

## 2019-08-29 DIAGNOSIS — C7949 Secondary malignant neoplasm of other parts of nervous system: Secondary | ICD-10-CM

## 2019-08-29 NOTE — Telephone Encounter (Signed)
I spoke with Ms. Domanski about her upcoming brain MRI on 12/10. At first she asked that the scan be moved out until after the first of the year because she didn't want to be coming to appointments around the holidays. Then she changed her mind and asked that the visits be moved back to 12/10. I let her know that someone from our clerical department will be reaching out to set up a virtual visit with Shona Simpson PA-C, to review the scan results the week of 12/14.  She was thankful for the call.  [name] R.T.(R)(T) Radiation Special Procedures Navigator

## 2019-09-03 ENCOUNTER — Telehealth: Payer: Self-pay

## 2019-09-03 NOTE — Telephone Encounter (Signed)
Spoke with patient about MRI

## 2019-09-04 NOTE — Progress Notes (Signed)
  Radiation Oncology         [phone]) [phone] ________________________________  Name: [name] Riley MRN: [id number]  Date: 06/27/2019  DOB: 19-Dec-1950  End of Treatment Note  Diagnosis:   Brain metastasis     Indication for treatment:  palliative       Radiation treatment dates:   06/21/19 - 06/27/19  Site/dose:    PTV1 and PTV2 were treated to a dose of 27 Gy in 3 fractions. This was completed with a SRS technique:  VMAT x 4 fields.  Narrative: The patient tolerated radiation treatment well.   There were no signs of acute toxicity after treatment.  Plan: The patient has completed radiation treatment. The patient will return to radiation oncology clinic for routine followup in one month. I advised the patient to call or return sooner if they have any questions or concerns related to their recovery or treatment. ________________________________  ------------------------------------------------  Jodelle Gross, MD, PhD

## 2019-09-06 ENCOUNTER — Inpatient Hospital Stay: Payer: Medicare Other | Attending: Hematology and Oncology

## 2019-09-06 ENCOUNTER — Other Ambulatory Visit: Payer: Self-pay

## 2019-09-06 VITALS — BP 153/83 | HR 71 | Temp 98.9°F | Resp 18

## 2019-09-06 DIAGNOSIS — Z170 Estrogen receptor positive status [ER+]: Secondary | ICD-10-CM | POA: Diagnosis not present

## 2019-09-06 DIAGNOSIS — Z5112 Encounter for antineoplastic immunotherapy: Secondary | ICD-10-CM | POA: Insufficient documentation

## 2019-09-06 DIAGNOSIS — C50412 Malignant neoplasm of upper-outer quadrant of left female breast: Secondary | ICD-10-CM

## 2019-09-06 DIAGNOSIS — C50919 Malignant neoplasm of unspecified site of unspecified female breast: Secondary | ICD-10-CM

## 2019-09-06 MED ORDER — ACETAMINOPHEN 325 MG PO TABS
650.0000 mg | ORAL_TABLET | Freq: Once | ORAL | Status: AC
  Administered 2019-09-06: 14:00:00 650 mg via ORAL

## 2019-09-06 MED ORDER — HEPARIN SOD (PORK) LOCK FLUSH 100 UNIT/ML IV SOLN
500.0000 [IU] | Freq: Once | INTRAVENOUS | Status: AC | PRN
  Administered 2019-09-06: 16:00:00 500 [IU]
  Filled 2019-09-06: qty 5

## 2019-09-06 MED ORDER — DIPHENHYDRAMINE HCL 25 MG PO CAPS
ORAL_CAPSULE | ORAL | Status: AC
  Filled 2019-09-06: qty 2

## 2019-09-06 MED ORDER — SODIUM CHLORIDE 0.9% FLUSH
10.0000 mL | INTRAVENOUS | Status: DC | PRN
  Administered 2019-09-06: 16:00:00 10 mL
  Filled 2019-09-06: qty 10

## 2019-09-06 MED ORDER — ACETAMINOPHEN 325 MG PO TABS
ORAL_TABLET | ORAL | Status: AC
  Filled 2019-09-06: qty 2

## 2019-09-06 MED ORDER — SODIUM CHLORIDE 0.9 % IV SOLN
Freq: Once | INTRAVENOUS | Status: AC
  Administered 2019-09-06: 14:00:00 via INTRAVENOUS
  Filled 2019-09-06: qty 250

## 2019-09-06 MED ORDER — TRASTUZUMAB-DKST CHEMO 150 MG IV SOLR
6.0000 mg/kg | Freq: Once | INTRAVENOUS | Status: AC
  Administered 2019-09-06: 15:00:00 399 mg via INTRAVENOUS
  Filled 2019-09-06: qty 19

## 2019-09-06 MED ORDER — DIPHENHYDRAMINE HCL 25 MG PO CAPS
50.0000 mg | ORAL_CAPSULE | Freq: Once | ORAL | Status: AC
  Administered 2019-09-06: 50 mg via ORAL

## 2019-09-06 NOTE — Patient Instructions (Signed)
[facility] Discharge Instructions for Patients Receiving Chemotherapy  Today you received the following chemotherapy agents Trastuzumab (OGIVRI).  To help prevent nausea and vomiting after your treatment, we encourage you to take your nausea medication as prescribed.   If you develop nausea and vomiting that is not controlled by your nausea medication, call the clinic.   BELOW ARE SYMPTOMS THAT SHOULD BE REPORTED IMMEDIATELY:  *FEVER GREATER THAN 100.5 F  *CHILLS WITH OR WITHOUT FEVER  NAUSEA AND VOMITING THAT IS NOT CONTROLLED WITH YOUR NAUSEA MEDICATION  *UNUSUAL SHORTNESS OF BREATH  *UNUSUAL BRUISING OR BLEEDING  TENDERNESS IN MOUTH AND THROAT WITH OR WITHOUT PRESENCE OF ULCERS  *URINARY PROBLEMS  *BOWEL PROBLEMS  UNUSUAL RASH Items with * indicate a potential emergency and should be followed up as soon as possible.  Feel free to call the clinic should you have any questions or concerns. The clinic phone number is [phone].  Please show the CHEMO ALERT CARD at check-in to the Emergency Department and triage nurse.  Coronavirus (COVID-19) Are you at risk?  Are you at risk for the Coronavirus (COVID-19)?  To be considered HIGH RISK for Coronavirus (COVID-19), you have to meet the following criteria:  . Traveled to [place], [place], [name], [place] or [place]; or in the [place] to [place], [place], [place], or [place]; and have fever, cough, and shortness of breath within the last 2 weeks of travel OR . Been in close contact with a person diagnosed with COVID-19 within the last 2 weeks and have fever, cough, and shortness of breath . IF YOU DO NOT MEET THESE CRITERIA, YOU ARE CONSIDERED LOW RISK FOR COVID-19.  What to do if you are HIGH RISK for COVID-19?  . If you are having a medical emergency, call 911. . Seek medical care right away. Before you go to a doctor's office, urgent care or emergency department, call ahead and tell them  about your recent travel, contact with someone diagnosed with COVID-19, and your symptoms. You should receive instructions from your physician's office regarding next steps of care.  . When you arrive at healthcare provider, tell the healthcare staff immediately you have returned from visiting [place], [place], [place], [place] or [name]; or traveled in the [place] to [place], [place], [place], or [place]; in the last two weeks or you have been in close contact with a person diagnosed with COVID-19 in the last 2 weeks.   . Tell the health care staff about your symptoms: fever, cough and shortness of breath. . After you have been seen by a medical provider, you will be either: o Tested for (COVID-19) and discharged home on quarantine except to seek medical care if symptoms worsen, and asked to  - Stay home and avoid contact with others until you get your results (4-5 days)  - Avoid travel on public transportation if possible (such as bus, train, or airplane) or o Sent to the Emergency Department by EMS for evaluation, COVID-19 testing, and possible admission depending on your condition and test results.  What to do if you are LOW RISK for COVID-19?  Reduce your risk of any infection by using the same precautions used for avoiding the common cold or flu:  . Wash your hands often with soap and warm water for at least 20 seconds.  If soap and water are not readily available, use an alcohol-based hand sanitizer with at least 60% alcohol.  . If coughing or   sneezing, cover your mouth and nose by coughing or sneezing into the elbow areas of your shirt or coat, into a tissue or into your sleeve (not your hands). . Avoid shaking hands with others and consider head nods or verbal greetings only. . Avoid touching your eyes, nose, or mouth with unwashed hands.  . Avoid close contact with people who are sick. . Avoid places or events with large numbers of people in one location, like concerts or  sporting events. . Carefully consider travel plans you have or are making. . If you are planning any travel outside or inside the [name], visit the CDC's Travelers' Health webpage for the latest health notices. . If you have some symptoms but not all symptoms, continue to monitor at home and seek medical attention if your symptoms worsen. . If you are having a medical emergency, call 911.   [place] / e-Visit: [email or web address]         [facility]: [facility]: [phone]                   [facility]: [phone]

## 2019-09-09 ENCOUNTER — Other Ambulatory Visit: Payer: Self-pay

## 2019-09-09 NOTE — Progress Notes (Signed)
Subjective:  Patient ID: [name] Riley, female    DOB: 1950-12-24  Age: 68 y.o. MRN: [id number]  CC: Hypothyroidism   HPI TALAYSHA RAYLE presents for  follow-up of hypertension. Patient has no history of headache chest pain or shortness of breath or recent cough. Patient also denies symptoms of TIA such as focal numbness or weakness. Patient denies side effects from medication. States taking it regularly.   follow-up on  thyroid. The patient has a history of hypothyroidism for many years. It has been stable recently. Pt. denies any change in  voice, loss of hair, heat or cold intolerance. (She does report feeling cold more and also a bit shaky for a few days after chemo infusions.) Energy level has been adequate to good. Patient denies constipation and diarrhea. No myxedema. Medication is as noted below. Verified that pt is taking it daily on an empty stomach. Well tolerated.      History Delsie has a past medical history of Allergy, Breast cancer ([place]) (08/25/2011), Cancer [facility]), Coronary artery disease (2001), Heart attack ([place]) (09/2000), History of chemotherapy (comp. 08/22/2012), Hyperlipidemia, Hypertension, Hypothyroidism, PONV (postoperative nausea and vomiting), and Status post radiation therapy (07/09/12 - 08/22/2012).   She has a past surgical history that includes Tonsillectomy (1968); Ovarian cyst surgery (Right, 1970); skin tags (05/09/1997); Portacath placement (11/14/2011); Breast surgery (1998); Breast surgery (11/14/11); Abdominal hysterectomy (1998); Appendectomy (1970); Port-a-cath removal (08/30/2012); Mastectomy (Bilateral); Portacath placement (N/A, 04/25/2016); Mass excision (Left, 06/22/2017); and Incision and drainage of wound (Left, 07/01/2017).   Her family history includes Cancer in her cousin, paternal aunt, paternal grandfather, and paternal uncle; Cancer (age of onset: 51) in her father; Diabetes in her maternal grandmother; Hypertension in her maternal grandmother and  mother.She reports that she has never smoked. She has never used smokeless tobacco. She reports that she does not drink alcohol or use drugs.  Current Outpatient Medications on File Prior to Visit  Medication Sig Dispense Refill  . acetaminophen (TYLENOL) 500 MG tablet Take 1,000 mg by mouth every 6 (six) hours as needed for moderate pain.    . cholecalciferol (VITAMIN D) 1000 units tablet Take 1 tablet (1,000 Units total) by mouth daily.    . diphenhydrAMINE (BENADRYL) 25 MG tablet Take 50 mg by mouth at bedtime as needed for sleep.    Marland Kitchen letrozole (FEMARA) 2.5 MG tablet TAKE ONE (1) TABLET EACH DAY 90 tablet 3  . levETIRAcetam (KEPPRA) 500 MG tablet TAKE ONE TABLET BY MOUTH TWICE DAILY 60 tablet 2  . levothyroxine (SYNTHROID) 100 MCG tablet Take 1 tablet (100 mcg total) by mouth daily before breakfast. BRAND ONLY 90 tablet 1  . lidocaine-prilocaine (EMLA) cream APPLY TOPICALLY AS NEEDED FOR PORT ACCESS 30 g 3  . metoprolol succinate (TOPROL-XL) 100 MG 24 hr tablet Take 1 tablet (100 mg total) by mouth daily. 90 tablet 3  . Multiple Vitamins-Minerals (MULTIVITAMIN WITH MINERALS) tablet Take 1 tablet by mouth daily.      . ondansetron (ZOFRAN-ODT) 4 MG disintegrating tablet Take 1 tablet (4 mg total) by mouth every 6 (six) hours as needed for nausea or vomiting. 30 tablet 3  . rosuvastatin (CRESTOR) 5 MG tablet Take 1 tablet (5 mg total) by mouth 2 (two) times a week. For cholesterol 26 tablet 3  . valsartan (DIOVAN) 80 MG tablet Take 1 tablet (80 mg total) by mouth daily. For blood pressure 30 tablet 2  . VERZENIO 150 MG tablet TAKE 1 TABLET (150 MG TOTAL) BY MOUTH 2 (  TWO) TIMES DAILY. SWALLOW TABLETS WHOLE. DO NOT CHEW, CRUSH, OR SPLIT TABLETS BEFORE SWALLOWING. 56 tablet 1   Current Facility-Administered Medications on File Prior to Visit  Medication Dose Route Frequency Provider Last Rate Last Dose  . 0.9 %  sodium chloride infusion   Intravenous Continuous Donnie Mesa, MD      .  acetaminophen (TYLENOL) tablet 975 mg  975 mg Oral Q6H Donnie Mesa, MD      . enoxaparin (LOVENOX) injection 40 mg  40 mg Subcutaneous Q24H Donnie Mesa, MD      . morphine 4 MG/ML injection 2-4 mg  2-4 mg Intravenous Q2H PRN Donnie Mesa, MD      . ondansetron (ZOFRAN-ODT) disintegrating tablet 4 mg  4 mg Oral Q6H PRN Donnie Mesa, MD       Or  . ondansetron (ZOFRAN) 4 mg in sodium chloride 0.9 % 50 mL IVPB  4 mg Intravenous Q6H PRN Donnie Mesa, MD      . oxyCODONE (Oxy IR/ROXICODONE) immediate release tablet 5-10 mg  5-10 mg Oral Q4H PRN Donnie Mesa, MD      . piperacillin-tazobactam (ZOSYN) 3.375 g in dextrose 5 % 50 mL IVPB  3.375 g Intravenous Q8H Donnie Mesa, MD      . prochlorperazine (COMPAZINE) tablet 10 mg  10 mg Oral Q6H PRN Donnie Mesa, MD       Or  . prochlorperazine (COMPAZINE) injection 5-10 mg  5-10 mg Intravenous Q6H PRN Donnie Mesa, MD        ROS Review of Systems  Constitutional: Negative.   HENT: Negative for congestion.   Eyes: Negative for visual disturbance.  Respiratory: Negative for shortness of breath.   Cardiovascular: Negative for chest pain.  Gastrointestinal: Negative for abdominal pain, constipation, diarrhea, nausea and vomiting.  Genitourinary: Negative for difficulty urinating.  Musculoskeletal: Negative for arthralgias and myalgias.  Neurological: Negative for headaches.  Psychiatric/Behavioral: Negative for sleep disturbance.    Objective:  BP 138/80   Pulse 85   Temp 98.4 F (36.9 C) (Temporal)   Ht 5\' 8"  (1.727 m)   Wt 145 lb (65.8 kg)   SpO2 97%   BMI 22.05 kg/m   BP Readings from Last 3 Encounters:  09/10/19 138/80  09/06/19 (!) 153/83  08/09/19 137/81    Wt Readings from Last 3 Encounters:  09/10/19 145 lb (65.8 kg)  07/12/19 144 lb 1.6 oz (65.4 kg)  06/17/19 136 lb 12.8 oz (62.1 kg)     Physical Exam Constitutional:      General: She is not in acute distress.    Appearance: She is well-developed.   HENT:     Head: Normocephalic and atraumatic.     Right Ear: External ear normal.     Left Ear: External ear normal.     Nose: Nose normal.  Eyes:     Conjunctiva/sclera: Conjunctivae normal.     Pupils: Pupils are equal, round, and reactive to light.  Neck:     Musculoskeletal: Normal range of motion and neck supple.     Thyroid: No thyromegaly.  Cardiovascular:     Rate and Rhythm: Normal rate and regular rhythm.     Heart sounds: Normal heart sounds. No murmur.  Pulmonary:     Effort: Pulmonary effort is normal. No respiratory distress.     Breath sounds: Normal breath sounds. No wheezing or rales.  Abdominal:     General: Bowel sounds are normal. There is no distension.     Palpations: Abdomen  is soft.     Tenderness: There is no abdominal tenderness.  Lymphadenopathy:     Cervical: No cervical adenopathy.  Skin:    General: Skin is warm and dry.  Neurological:     Mental Status: She is alert and oriented to person, place, and time.     Deep Tendon Reflexes: Reflexes are normal and symmetric.  Psychiatric:        Behavior: Behavior normal.        Thought Content: Thought content normal.        Judgment: Judgment normal.       Assessment & Plan:   Ranjit was seen today for hypothyroidism.  Diagnoses and all orders for this visit:  Essential hypertension -     CBC -     CMP -     Levetiracetam level  Hypertension, unspecified type -     CBC -     CMP -     Levetiracetam level  Atherosclerosis of native coronary artery of native heart with other form of angina pectoris (HCC) -     CBC -     CMP -     Levetiracetam level  Mixed hyperlipidemia -     CBC -     CMP -     Levetiracetam level -     Lipid  Hypothyroidism, unspecified type -     CBC -     CMP -     TSH + free T4 -     Levetiracetam level   Allergies as of 09/10/2019      Reactions   Cantaloupe Extract Allergy Skin Test Shortness Of Breath   Contrast Media [iodinated Diagnostic  Agents] Shortness Of Breath, Rash   Pravastatin Other (See Comments)   Legs hurt   Zosyn [piperacillin Sod-tazobactam So] Rash, Other (See Comments)   Temperature increase, facial flushing      Medication List       Accurate as of September 10, 2019  9:39 AM. If you have any questions, ask your nurse or doctor.        acetaminophen 500 MG tablet Commonly known as: TYLENOL Take 1,000 mg by mouth every 6 (six) hours as needed for moderate pain.   cholecalciferol 1000 units tablet Commonly known as: VITAMIN D Take 1 tablet (1,000 Units total) by mouth daily.   diphenhydrAMINE 25 MG tablet Commonly known as: BENADRYL Take 50 mg by mouth at bedtime as needed for sleep.   letrozole 2.5 MG tablet Commonly known as: FEMARA TAKE ONE (1) TABLET EACH DAY   levETIRAcetam 500 MG tablet Commonly known as: KEPPRA TAKE ONE TABLET BY MOUTH TWICE DAILY   levothyroxine 100 MCG tablet Commonly known as: SYNTHROID Take 1 tablet (100 mcg total) by mouth daily before breakfast. BRAND ONLY   lidocaine-prilocaine cream Commonly known as: EMLA APPLY TOPICALLY AS NEEDED FOR PORT ACCESS   metoprolol succinate 100 MG 24 hr tablet Commonly known as: TOPROL-XL Take 1 tablet (100 mg total) by mouth daily.   multivitamin with minerals tablet Take 1 tablet by mouth daily.   ondansetron 4 MG disintegrating tablet Commonly known as: ZOFRAN-ODT Take 1 tablet (4 mg total) by mouth every 6 (six) hours as needed for nausea or vomiting.   rosuvastatin 5 MG tablet Commonly known as: Crestor Take 1 tablet (5 mg total) by mouth 2 (two) times a week. For cholesterol   valsartan 80 MG tablet Commonly known as: Diovan Take 1 tablet (80 mg total) by  mouth daily. For blood pressure   Verzenio 150 MG tablet Generic drug: abemaciclib TAKE 1 TABLET (150 MG TOTAL) BY MOUTH 2 (TWO) TIMES DAILY. SWALLOW TABLETS WHOLE. DO NOT CHEW, CRUSH, OR SPLIT TABLETS BEFORE SWALLOWING.           Follow-up: Return  in about 3 months (around 12/11/2019).  [name], M.D.

## 2019-09-10 ENCOUNTER — Ambulatory Visit (INDEPENDENT_AMBULATORY_CARE_PROVIDER_SITE_OTHER): Payer: Medicare Other | Admitting: Family Medicine

## 2019-09-10 ENCOUNTER — Encounter: Payer: Self-pay | Admitting: Family Medicine

## 2019-09-10 VITALS — BP 138/80 | HR 85 | Temp 98.4°F | Ht 68.0 in | Wt 145.0 lb

## 2019-09-10 DIAGNOSIS — I10 Essential (primary) hypertension: Secondary | ICD-10-CM | POA: Diagnosis not present

## 2019-09-10 DIAGNOSIS — E782 Mixed hyperlipidemia: Secondary | ICD-10-CM | POA: Diagnosis not present

## 2019-09-10 DIAGNOSIS — E039 Hypothyroidism, unspecified: Secondary | ICD-10-CM | POA: Diagnosis not present

## 2019-09-10 DIAGNOSIS — I25118 Atherosclerotic heart disease of native coronary artery with other forms of angina pectoris: Secondary | ICD-10-CM

## 2019-09-12 LAB — CBC WITH DIFFERENTIAL/PLATELET
Basophils Absolute: 0 10*3/uL (ref 0.0–0.2)
Basos: 2 %
EOS (ABSOLUTE): 0.1 10*3/uL (ref 0.0–0.4)
Eos: 2 %
Hematocrit: 30.7 % — ABNORMAL LOW (ref 34.0–46.6)
Hemoglobin: 10.3 g/dL — ABNORMAL LOW (ref 11.1–15.9)
Immature Grans (Abs): 0 10*3/uL (ref 0.0–0.1)
Immature Granulocytes: 0 %
Lymphocytes Absolute: 1.2 10*3/uL (ref 0.7–3.1)
Lymphs: 46 %
MCH: 35.3 pg — ABNORMAL HIGH (ref 26.6–33.0)
MCHC: 33.6 g/dL (ref 31.5–35.7)
MCV: 105 fL — ABNORMAL HIGH (ref 79–97)
Monocytes Absolute: 0.2 10*3/uL (ref 0.1–0.9)
Monocytes: 9 %
Neutrophils Absolute: 1 10*3/uL — ABNORMAL LOW (ref 1.4–7.0)
Neutrophils: 41 %
Platelets: 80 10*3/uL — CL (ref 150–450)
RBC: 2.92 x10E6/uL — ABNORMAL LOW (ref 3.77–5.28)
RDW: 13.9 % (ref 11.7–15.4)
WBC: 2.5 10*3/uL — CL (ref 3.4–10.8)

## 2019-09-12 LAB — CMP14+EGFR
ALT: 21 IU/L (ref 0–32)
AST: 36 IU/L (ref 0–40)
Albumin/Globulin Ratio: 1.8 (ref 1.2–2.2)
Albumin: 4.2 g/dL (ref 3.8–4.8)
Alkaline Phosphatase: 165 IU/L — ABNORMAL HIGH (ref 39–117)
BUN/Creatinine Ratio: 17 (ref 12–28)
BUN: 15 mg/dL (ref 8–27)
Bilirubin Total: 0.4 mg/dL (ref 0.0–1.2)
CO2: 27 mmol/L (ref 20–29)
Calcium: 9.8 mg/dL (ref 8.7–10.3)
Chloride: 101 mmol/L (ref 96–106)
Creatinine, Ser: 0.88 mg/dL (ref 0.57–1.00)
GFR calc Af Amer: 79 mL/min/{1.73_m2} (ref >59)
GFR calc non Af Amer: 69 mL/min/{1.73_m2} (ref >59)
Globulin, Total: 2.3 g/dL (ref 1.5–4.5)
Glucose: 86 mg/dL (ref 65–99)
Potassium: 3.6 mmol/L (ref 3.5–5.2)
Sodium: 141 mmol/L (ref 134–144)
Total Protein: 6.5 g/dL (ref 6.0–8.5)

## 2019-09-12 LAB — LIPID PANEL
Chol/HDL Ratio: 2.9 ratio (ref 0.0–4.4)
Cholesterol, Total: 243 mg/dL — ABNORMAL HIGH (ref 100–199)
HDL: 83 mg/dL (ref >39)
LDL Chol Calc (NIH): 144 mg/dL — ABNORMAL HIGH (ref 0–99)
Triglycerides: 95 mg/dL (ref 0–149)
VLDL Cholesterol Cal: 16 mg/dL (ref 5–40)

## 2019-09-12 LAB — TSH+FREE T4
Free T4: 1.46 ng/dL (ref 0.82–1.77)
TSH: 1.33 u[IU]/mL (ref 0.450–4.500)

## 2019-09-12 LAB — LEVETIRACETAM LEVEL: Levetiracetam Lvl: 1.7 ug/mL — ABNORMAL LOW (ref 10.0–40.0)

## 2019-09-16 ENCOUNTER — Other Ambulatory Visit: Payer: Self-pay | Admitting: Family Medicine

## 2019-09-16 MED ORDER — ROSUVASTATIN CALCIUM 5 MG PO TABS: 5.0000 mg | ORAL_TABLET | Freq: Every day | ORAL | 1 refills | Status: DC

## 2019-09-19 ENCOUNTER — Other Ambulatory Visit: Payer: Self-pay | Admitting: *Deleted

## 2019-09-19 DIAGNOSIS — Z79899 Other long term (current) drug therapy: Secondary | ICD-10-CM

## 2019-09-19 DIAGNOSIS — Z5181 Encounter for therapeutic drug level monitoring: Secondary | ICD-10-CM

## 2019-09-19 NOTE — Progress Notes (Signed)
Echocardiogram scheduled for 09/25/2019 at 1 p.m at [facility].  Pt notified.

## 2019-09-25 ENCOUNTER — Other Ambulatory Visit: Payer: Self-pay

## 2019-09-25 ENCOUNTER — Ambulatory Visit (HOSPITAL_COMMUNITY)
Admission: RE | Admit: 2019-09-25 | Discharge: 2019-09-25 | Disposition: A | Discharge status: Home / Self Care | Payer: Medicare Other | Source: Ambulatory Visit | Attending: Hematology and Oncology | Admitting: Hematology and Oncology

## 2019-09-25 DIAGNOSIS — I340 Nonrheumatic mitral (valve) insufficiency: Secondary | ICD-10-CM

## 2019-09-25 DIAGNOSIS — I361 Nonrheumatic tricuspid (valve) insufficiency: Secondary | ICD-10-CM | POA: Diagnosis not present

## 2019-09-25 DIAGNOSIS — Z5181 Encounter for therapeutic drug level monitoring: Secondary | ICD-10-CM | POA: Insufficient documentation

## 2019-09-25 DIAGNOSIS — Z79899 Other long term (current) drug therapy: Secondary | ICD-10-CM | POA: Diagnosis present

## 2019-09-25 NOTE — Progress Notes (Signed)
*  PRELIMINARY RESULTS* Echocardiogram 2D Echocardiogram has been performed.  [name] Riley 09/25/2019, 1:58 PM

## 2019-09-26 ENCOUNTER — Ambulatory Visit (HOSPITAL_COMMUNITY): Payer: Medicare Other

## 2019-09-26 ENCOUNTER — Other Ambulatory Visit: Payer: Self-pay | Admitting: Family Medicine

## 2019-09-26 ENCOUNTER — Ambulatory Visit (HOSPITAL_COMMUNITY)
Admission: RE | Admit: 2019-09-26 | Discharge: 2019-09-26 | Disposition: A | Discharge status: Home / Self Care | Payer: Medicare Other | Source: Ambulatory Visit | Attending: Radiation Oncology | Admitting: Radiation Oncology

## 2019-09-26 DIAGNOSIS — C7949 Secondary malignant neoplasm of other parts of nervous system: Secondary | ICD-10-CM | POA: Diagnosis present

## 2019-09-26 DIAGNOSIS — C7931 Secondary malignant neoplasm of brain: Secondary | ICD-10-CM | POA: Insufficient documentation

## 2019-09-26 MED ORDER — HEPARIN SOD (PORK) LOCK FLUSH 100 UNIT/ML IV SOLN
500.0000 [IU] | INTRAVENOUS | Status: AC | PRN
  Administered 2019-09-26: 13:00:00 500 [IU]
  Filled 2019-09-26: qty 5

## 2019-09-26 MED ORDER — GADOBUTROL 1 MMOL/ML IV SOLN
6.0000 mL | Freq: Once | INTRAVENOUS | Status: AC | PRN
  Administered 2019-09-26: 6 mL via INTRAVENOUS

## 2019-09-30 ENCOUNTER — Encounter: Payer: Self-pay | Admitting: Radiation Oncology

## 2019-09-30 ENCOUNTER — Inpatient Hospital Stay: Payer: Medicare Other | Attending: Hematology and Oncology

## 2019-09-30 ENCOUNTER — Ambulatory Visit: Payer: Self-pay | Admitting: Radiation Oncology

## 2019-09-30 ENCOUNTER — Ambulatory Visit
Admission: RE | Admit: 2019-09-30 | Discharge: 2019-09-30 | Disposition: A | Discharge status: Home / Self Care | Payer: Medicare Other | Source: Ambulatory Visit | Attending: Radiation Oncology | Admitting: Radiation Oncology

## 2019-09-30 ENCOUNTER — Other Ambulatory Visit: Payer: Self-pay

## 2019-09-30 DIAGNOSIS — Z79899 Other long term (current) drug therapy: Secondary | ICD-10-CM | POA: Insufficient documentation

## 2019-09-30 DIAGNOSIS — R200 Anesthesia of skin: Secondary | ICD-10-CM | POA: Insufficient documentation

## 2019-09-30 DIAGNOSIS — Z170 Estrogen receptor positive status [ER+]: Secondary | ICD-10-CM

## 2019-09-30 DIAGNOSIS — Z79811 Long term (current) use of aromatase inhibitors: Secondary | ICD-10-CM | POA: Insufficient documentation

## 2019-09-30 DIAGNOSIS — C7931 Secondary malignant neoplasm of brain: Secondary | ICD-10-CM | POA: Insufficient documentation

## 2019-09-30 DIAGNOSIS — Z510 Encounter for antineoplastic radiation therapy: Secondary | ICD-10-CM | POA: Insufficient documentation

## 2019-09-30 DIAGNOSIS — Z5111 Encounter for antineoplastic chemotherapy: Secondary | ICD-10-CM | POA: Insufficient documentation

## 2019-09-30 DIAGNOSIS — Z923 Personal history of irradiation: Secondary | ICD-10-CM | POA: Insufficient documentation

## 2019-09-30 DIAGNOSIS — C50412 Malignant neoplasm of upper-outer quadrant of left female breast: Secondary | ICD-10-CM | POA: Insufficient documentation

## 2019-09-30 DIAGNOSIS — C7951 Secondary malignant neoplasm of bone: Secondary | ICD-10-CM | POA: Insufficient documentation

## 2019-09-30 DIAGNOSIS — Z5112 Encounter for antineoplastic immunotherapy: Secondary | ICD-10-CM | POA: Insufficient documentation

## 2019-09-30 NOTE — Progress Notes (Signed)
Radiation Oncology         [phone] ________________________________  Outpatient Follow Up - Conducted via telephone due to current COVID-19 concerns for limiting patient exposure  I spoke with the patient to conduct this consult visit via telephone to spare the patient unnecessary potential exposure in the healthcare setting during the current COVID-19 pandemic. 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  INTERVAL SINCE LAST RADIATION:  3 months   06/21/2019-06/27/2019 SRS Treatment: PTV1 Posterior Right Frontal 17 mm target was treated to 27 Gy in 3 fractions  07/17/12-08/22/12:  The left chest wall and regional nodes were treated.  NARRATIVE:  The patient as a pleasant 68 y.o. female with a history of triple positive breast cancer. She has a known history of bone disease including calvarial disease. She received SRS treatment in September 2020 after she was found to have a right frontal lesion. She has been followed since and has done well. She did have a repeat MRI of the brain on 09/26/2019 that revealed improvement in the lesion which now measures 10.5 mm. No new disease was noted. 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 Lindi Adie and sees him Friday this week.   On review of systems, the patient reports that she is doing well overall. She reports persistent discomfort that is intermittent of her scalp and reports she also had a previous area of shingles along the left neck. She reports that the scalp diffusely  hurts on some days and seems to be brought on by combing or brushing is hair.  She reports that she does not have headaches, visual or auditory changes. She also states she is not having any changes in gait or movement. She denies any chest pain, shortness of breath, cough, fevers, chills, night sweats, unintended weight changes. She denies any bowel or bladder disturbances, and denies abdominal pain, nausea or vomiting. She denies any new musculoskeletal or joint aches or pains, new skin lesions or concerns. A complete review of systems is obtained and is otherwise negative.  PAST MEDICAL HISTORY:  Past Medical History:  Diagnosis Date  . Allergy   . Breast cancer ([place]) 08/25/2011   L , invasive ductal carcinoma, ER/PR +,HER2 -  . Cancer [facility])    left breast cancer  . Coronary artery disease 2001  . Heart attack [facility]) 09/2000   Sep 25, 2000  --no intervention  . History of chemotherapy comp. 08/22/2012   4 cycles of FEC and $ cycles of Taxotere  . Hyperlipidemia   . Hypertension   . Hypothyroidism   . PONV (postoperative nausea and vomiting)    gets sick from anesthesia  . Status post radiation therapy 07/09/12 - 08/22/2012   Left Breast, 60.4 gray    PAST SURGICAL HISTORY: Past Surgical History:  Procedure Laterality Date  . ABDOMINAL HYSTERECTOMY  1998   TAH, oophorectomy  . APPENDECTOMY  1970  . BREAST SURGERY  1998   removal of benign lump in rt breast  . BREAST SURGERY  11/14/11   right simple mastectomy, left mrm  . INCISION  AND DRAINAGE OF WOUND Left 07/01/2017   Procedure: IRRIGATION AND DEBRIDEMENT CHEST WALL ABSCESS;  Surgeon: Donnie Mesa, MD;  Location: [facility];  Service: General;  Laterality: Left;  Marland Kitchen MASS EXCISION Left 06/22/2017   Procedure: EXCISION OF CHEST WALL MASSES;  Surgeon: Donnie Mesa, MD;  Location: [place];  Service: General;  Laterality: Left;  Marland Kitchen MASTECTOMY Bilateral    for left breast cancer  . OVARIAN CYST SURGERY Right 1970  . PORT-A-CATH REMOVAL   08/30/2012   Procedure: REMOVAL PORT-A-CATH;  Surgeon: Imogene Burn. [name], MD;  Location: [place];  Service: General;  Laterality: Right;  port removal  . PORTACATH PLACEMENT  11/14/2011   Procedure: INSERTION PORT-A-CATH;  Surgeon: Imogene Burn. [name], MD;  Location: [place];  Service: General;  Laterality: Right;  . PORTACATH PLACEMENT N/A 04/25/2016   Procedure: INSERTION PORT-A-CATH LEFT CHEST;  Surgeon: Donnie Mesa, MD;  Location: [place];  Service: General;  Laterality: N/A;  . skin tags  05/09/1997   left axillary left neck skin tags  . TONSILLECTOMY  1968    PAST SOCIAL HISTORY:  Social History   Socioeconomic History  . Marital status: Married    Spouse name: Not on file  . Number of children: 3  . Years of education: Not on file  . Highest education level: Not on file  Occupational History  . Occupation: Works at [employer]  Tobacco Use  . Smoking status: Never Smoker  . Smokeless tobacco: Never Used  Substance and Sexual Activity  . Alcohol use: No  . Drug use: No  . Sexual activity: Yes    Birth control/protection: Surgical    Comment: menarche 70, Parity age 37, G5, P3, 1 miscarriage,  HRT x 5-10 yrs, Mild Hot Flashes  Other Topics Concern  . Not on file  Social History Narrative   Lives at home.   Social Determinants of Health   Financial Resource Strain:   . Difficulty of Paying Living Expenses: Not on file  Food Insecurity:   . Worried About Charity fundraiser in the Last Year: Not on file  . Ran Out of Food in the Last Year: Not on file  Transportation Needs: No Transportation Needs  . Lack of Transportation (Medical): No  . Lack of Transportation (Non-Medical): No  Physical Activity:   . Days of Exercise per Week: Not on file  . Minutes of Exercise per Session: Not on file  Stress:   . Feeling of Stress : Not on file  Social Connections:   . Frequency of Communication with Friends and Family: Not on file  .  Frequency of Social Gatherings with Friends and Family: Not on file  . Attends Religious Services: Not on file  . Active Member of Clubs or Organizations: Not on file  . Attends Archivist Meetings: Not on file  . Marital Status: Not on file  Intimate Partner Violence:   . Fear of Current or Ex-Partner: Not on file  . Emotionally Abused: Not on file  . Physically Abused: Not on file  . Sexually Abused: Not on file    PAST FAMILY HISTORY: Family History  Problem Relation Age of Onset  . Hypertension Maternal Grandmother   . Diabetes Maternal Grandmother   . Cancer Father 60       lung cancer and Prostate Cancer  . Hypertension Mother   . Cancer Paternal Aunt        ovarian  . Cancer  Cousin        breast, paternal cousin  . Cancer Paternal Uncle        stomach  . Cancer Paternal Grandfather        Esophagus  . Colon cancer Neg Hx     MEDICATIONS  Current Outpatient Medications  Medication Sig Dispense Refill  . cholecalciferol (VITAMIN D) 1000 units tablet Take 1 tablet (1,000 Units total) by mouth daily.    . diphenhydrAMINE (BENADRYL) 25 MG tablet Take 50 mg by mouth at bedtime as needed for sleep.    Marland Kitchen letrozole (FEMARA) 2.5 MG tablet TAKE ONE (1) TABLET EACH DAY 90 tablet 3  . levETIRAcetam (KEPPRA) 500 MG tablet TAKE ONE TABLET BY MOUTH TWICE DAILY 60 tablet 2  . levothyroxine (SYNTHROID) 100 MCG tablet Take 1 tablet (100 mcg total) by mouth daily before breakfast. BRAND ONLY 90 tablet 1  . lidocaine-prilocaine (EMLA) cream APPLY TOPICALLY AS NEEDED FOR PORT ACCESS 30 g 3  . metoprolol succinate (TOPROL-XL) 100 MG 24 hr tablet Take 1 tablet (100 mg total) by mouth daily. 90 tablet 3  . Multiple Vitamins-Minerals (MULTIVITAMIN WITH MINERALS) tablet Take 1 tablet by mouth daily.      . ondansetron (ZOFRAN-ODT) 4 MG disintegrating tablet Take 1 tablet (4 mg total) by mouth every 6 (six) hours as needed for nausea or vomiting. 30 tablet 3  . rosuvastatin (CRESTOR)  5 MG tablet Take 1 tablet (5 mg total) by mouth daily at 6 PM. For cholesterol 90 tablet 1  . valsartan (DIOVAN) 80 MG tablet TAKE 1 TABLET DAILY FOR BLOOD PRESSURE 30 tablet 2  . VERZENIO 150 MG tablet TAKE 1 TABLET (150 MG TOTAL) BY MOUTH 2 (TWO) TIMES DAILY. SWALLOW TABLETS WHOLE. DO NOT CHEW, CRUSH, OR SPLIT TABLETS BEFORE SWALLOWING. 56 tablet 1  . acetaminophen (TYLENOL) 500 MG tablet Take 1,000 mg by mouth every 6 (six) hours as needed for moderate pain.     No current facility-administered medications for this encounter.   Facility-Administered Medications Ordered in Other Encounters  Medication Dose Route Frequency Provider Last Rate Last Admin  . 0.9 %  sodium chloride infusion   Intravenous Continuous Donnie Mesa, MD      . acetaminophen (TYLENOL) tablet 975 mg  975 mg Oral Q6H Donnie Mesa, MD      . enoxaparin (LOVENOX) injection 40 mg  40 mg Subcutaneous Q24H Donnie Mesa, MD      . morphine 4 MG/ML injection 2-4 mg  2-4 mg Intravenous Q2H PRN Donnie Mesa, MD      . ondansetron (ZOFRAN-ODT) disintegrating tablet 4 mg  4 mg Oral Q6H PRN Donnie Mesa, MD       Or  . ondansetron (ZOFRAN) 4 mg in sodium chloride 0.9 % 50 mL IVPB  4 mg Intravenous Q6H PRN Donnie Mesa, MD      . oxyCODONE (Oxy IR/ROXICODONE) immediate release tablet 5-10 mg  5-10 mg Oral Q4H PRN Donnie Mesa, MD      . piperacillin-tazobactam (ZOSYN) 3.375 g in dextrose 5 % 50 mL IVPB  3.375 g Intravenous Q8H Donnie Mesa, MD      . prochlorperazine (COMPAZINE) tablet 10 mg  10 mg Oral Q6H PRN Donnie Mesa, MD       Or  . prochlorperazine (COMPAZINE) injection 5-10 mg  5-10 mg Intravenous Q6H PRN Donnie Mesa, MD        ALLERGIES:  Allergies  Allergen Reactions  . Cantaloupe Extract Allergy Skin Test Shortness Of Breath  .  Contrast Media [Iodinated Diagnostic Agents] Shortness Of Breath and Rash  . Pravastatin Other (See Comments)    Legs hurt  . Zosyn [Piperacillin Sod-Tazobactam So] Rash  and Other (See Comments)    Temperature increase, facial flushing   PHYSICAL EXAM: Unable to assess due to encounter type.  IMPRESSION/PLAN: 1. 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 The patient has been doing well and her imaging is reassuring. She was counseled on continuing MRI surveillance of the brain in about 3 months. She will also continue to follow up with Dr. Lindi Adie in medical oncology.  2. Thrombocytopenia and occasional epistaxis. She continues to use saline nasal sprays prn. 3. Soreness of the scalp. This could be from her prior shingles in the base of the neck, calvarial disease, or prior damage from therapy. We discussed options to consider neurontin but she would like to see how this goes in the next 3 months.    Given current concerns for patient exposure during the COVID-19 pandemic, this encounter was conducted via telephone.  The patient has given verbal consent for this type of encounter. The time spent during this encounter was 15 minutes and 50% of that time was spent in the coordination of her care. The attendants for this meeting include Shona Simpson, [facility] and Kelcee Bjorn Salvetti  During the encounter, Shona Simpson [facility] was located remotely at home. Jesse Hirst Mankins  was located at home.      Carola Rhine, PAC

## 2019-10-03 ENCOUNTER — Other Ambulatory Visit: Payer: Self-pay

## 2019-10-03 DIAGNOSIS — C50919 Malignant neoplasm of unspecified site of unspecified female breast: Secondary | ICD-10-CM

## 2019-10-03 NOTE — Progress Notes (Signed)
Patient Care Team: [name], MD as PCP - General (Family Medicine)  DIAGNOSIS:    ICD-10-CM   1. Malignant neoplasm of upper-outer quadrant of left breast in female, estrogen receptor positive ([place])  C50.412    Z17.0     SUMMARY OF ONCOLOGIC HISTORY: Oncology History  Breast cancer of upper-outer quadrant of left female breast ([place])  11/14/2011 Surgery   Bilateral mastectomy, prophylactic on the right, left breast IDC 3/18 lymph nodes positive with extracapsular extension ER 89%, PR 81%, HER-2 negative, Ki-67 79% T2 N1 A. stage IIB   12/13/2011 - 06/28/2012 Chemotherapy   4 cycles of FEC followed by 4 cycles of Taxotere   07/17/2012 - 08/22/2012 Radiation Therapy   Adjuvant radiation therapy   08/22/2012 - 03/16/2016 Anti-estrogen oral therapy   Arimidex 1 mg daily   03/16/2016 Relapse/Recurrence   Subcutaneous nodule excision left chest: Infiltrating carcinoma breast primary, ER positive, PR negative   03/29/2016 Imaging   CT CAP and bone scan: Lytic lesions T8 vertebral, T1 posterior element, subcutaneous nodule left lateral chest wall, nonspecific lung nodules; Bone scan: Mets to kull, left humerus, left eighth rib, T7/T8, sternum, left acetabulum   04/28/2016 - 06/17/2017 Chemotherapy   Herceptin, lapatinib, Faslodex, Zometa every 4 weeks, lapatinib discontinued in September 2018 due to elevation of LFTs   06/22/2017 Relapse/Recurrence   Surgical excision:Soft tissue mass left lateral chest wall primary breast cancer, soft tissue mass left medial chest wall breast cancer, tumor is within the dermis extending to the subcutaneous adipose tissue and involves portions of skeletal muscle   08/2017 - 02/16/2018 Chemotherapy   Faslodex with Herceptin and Perjeta along with Zometa every 4 weeks    03/02/2018 - 05/25/2018 Chemotherapy   Kadcyla    05/11/2018 Imaging   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 Associated vasogenic edema within the underlying  right cerebral hemisphere without significant midline shift. Signal abnormality throughout the visualized bone marrow, compatible with osseous metastatic disease.      06/04/2018 Imaging   CT CAP: Right lower lobe lung nodule 7 mm (was 5 mm); multiple bone metastases throughout the spine and ribs sternum scapula and humerus, slightly increased lower thoracic mets, right renal lesion 2.5 cm (was 1.2 cm) right femur met increased from 2.1 cm to 2.9 cm   06/15/2018 -  Anti-estrogen oral therapy   Abemaciclib, Herceptin, letrozole, Xgeva   06/07/2019 Imaging   Progression of brain metastases,2 discrete dural-based lesions involving the posterior right frontal lobe. The more anterior lesion now measures 16.5 x 17.5 x 14 mm. The posterior lesion now measures 9.5 x 13 x 20 mm.  Vasogenic edema of the frontal and parietal lobes   06/21/2019 - 06/27/2019 Radiation Therapy   SRS to the brain   Metastatic breast cancer ([place])  06/29/2017 Initial Diagnosis   Metastatic breast cancer ([place])   06/15/2018 -  Chemotherapy   The patient had trastuzumab (HERCEPTIN) 546 mg in sodium chloride 0.9 % 250 mL chemo infusion, 8 mg/kg = 546 mg, Intravenous,  Once, 13 of 13 cycles Administration: 546 mg (06/15/2018), 399 mg (09/07/2018), 399 mg (07/13/2018), 399 mg (10/12/2018), 399 mg (11/02/2018), 399 mg (11/30/2018), 399 mg (01/24/2019), 399 mg (12/28/2018), 399 mg (02/22/2019), 399 mg (03/22/2019), 399 mg (04/17/2019), 399 mg (08/10/2018), 399 mg (05/17/2019) trastuzumab-dkst (OGIVRI) 399 mg in sodium chloride 0.9 % 250 mL chemo infusion, 6 mg/kg = 399 mg (100 % of original dose 6 mg/kg), Intravenous,  Once, 4 of 7 cycles Dose modification: 6 mg/kg (  original dose 6 mg/kg, Cycle 14, Reason: Other (see comments), Comment: Biosimilar Conversion) Administration: 399 mg (06/14/2019), 399 mg (07/12/2019), 399 mg (08/09/2019), 399 mg (09/06/2019)  for chemotherapy treatment.      CHIEF COMPLIANT: Follow-up on Herceptin with abemaciclib,  letrozole, andXgeva   INTERVAL HISTORY: [name] Riley is a 68 y.o. with above-mentioned history of metastatic breast cancer currently on abemaciclib with letrozole along with Herceptin Xgeva. Echo on 09/25/19 showed an ejection fraction of 60-65%. Brain MRI on 09/26/19 showed right frontal dural metastases improved and new new metastasis, with widespread osseous metastatic disease. She presents to the clinicalone today for treatment.   REVIEW OF SYSTEMS:   Constitutional: Denies fevers, chills or abnormal weight loss Eyes: Denies blurriness of vision Ears, nose, mouth, throat, and face: Denies mucositis or sore throat Respiratory: Denies cough, dyspnea or wheezes Cardiovascular: Denies palpitation, chest discomfort Gastrointestinal: Denies nausea, heartburn or change in bowel habits Skin: Denies abnormal skin rashes Lymphatics: Denies new lymphadenopathy or easy bruising Neurological: Denies numbness, tingling or new weaknesses Behavioral/Psych: Mood is stable, no new changes  Extremities: No lower extremity edema Breast: denies any pain or lumps or nodules in either breasts All other systems were reviewed with the patient and are negative.  I have reviewed the past medical history, past surgical history, social history and family history with the patient and they are unchanged from previous note.  ALLERGIES:  is allergic to cantaloupe extract allergy skin test; contrast media [iodinated diagnostic agents]; pravastatin; and zosyn [piperacillin sod-tazobactam so].  MEDICATIONS:  Current Outpatient Medications  Medication Sig Dispense Refill  . acetaminophen (TYLENOL) 500 MG tablet Take 1,000 mg by mouth every 6 (six) hours as needed for moderate pain.    . cholecalciferol (VITAMIN D) 1000 units tablet Take 1 tablet (1,000 Units total) by mouth daily.    . diphenhydrAMINE (BENADRYL) 25 MG tablet Take 50 mg by mouth at bedtime as needed for sleep.    Marland Kitchen letrozole (FEMARA) 2.5 MG tablet  TAKE ONE (1) TABLET EACH DAY 90 tablet 3  . levETIRAcetam (KEPPRA) 500 MG tablet TAKE ONE TABLET BY MOUTH TWICE DAILY 60 tablet 2  . levothyroxine (SYNTHROID) 100 MCG tablet Take 1 tablet (100 mcg total) by mouth daily before breakfast. BRAND ONLY 90 tablet 1  . lidocaine-prilocaine (EMLA) cream APPLY TOPICALLY AS NEEDED FOR PORT ACCESS 30 g 3  . metoprolol succinate (TOPROL-XL) 100 MG 24 hr tablet Take 1 tablet (100 mg total) by mouth daily. 90 tablet 3  . Multiple Vitamins-Minerals (MULTIVITAMIN WITH MINERALS) tablet Take 1 tablet by mouth daily.      . ondansetron (ZOFRAN-ODT) 4 MG disintegrating tablet Take 1 tablet (4 mg total) by mouth every 6 (six) hours as needed for nausea or vomiting. 30 tablet 3  . rosuvastatin (CRESTOR) 5 MG tablet Take 1 tablet (5 mg total) by mouth daily at 6 PM. For cholesterol 90 tablet 1  . valsartan (DIOVAN) 80 MG tablet TAKE 1 TABLET DAILY FOR BLOOD PRESSURE 30 tablet 2  . VERZENIO 150 MG tablet TAKE 1 TABLET (150 MG TOTAL) BY MOUTH 2 (TWO) TIMES DAILY. SWALLOW TABLETS WHOLE. DO NOT CHEW, CRUSH, OR SPLIT TABLETS BEFORE SWALLOWING. 56 tablet 1   No current facility-administered medications for this visit.   Facility-Administered Medications Ordered in Other Visits  Medication Dose Route Frequency Provider Last Rate Last Admin  . 0.9 %  sodium chloride infusion   Intravenous Continuous Donnie Mesa, MD      . acetaminophen (TYLENOL) tablet  975 mg  975 mg Oral Q6H Donnie Mesa, MD      . enoxaparin (LOVENOX) injection 40 mg  40 mg Subcutaneous Q24H Donnie Mesa, MD      . morphine 4 MG/ML injection 2-4 mg  2-4 mg Intravenous Q2H PRN Donnie Mesa, MD      . ondansetron (ZOFRAN-ODT) disintegrating tablet 4 mg  4 mg Oral Q6H PRN Donnie Mesa, MD       Or  . ondansetron (ZOFRAN) 4 mg in sodium chloride 0.9 % 50 mL IVPB  4 mg Intravenous Q6H PRN Donnie Mesa, MD      . oxyCODONE (Oxy IR/ROXICODONE) immediate release tablet 5-10 mg  5-10 mg Oral Q4H PRN  Donnie Mesa, MD      . piperacillin-tazobactam (ZOSYN) 3.375 g in dextrose 5 % 50 mL IVPB  3.375 g Intravenous Q8H Donnie Mesa, MD      . prochlorperazine (COMPAZINE) tablet 10 mg  10 mg Oral Q6H PRN Donnie Mesa, MD       Or  . prochlorperazine (COMPAZINE) injection 5-10 mg  5-10 mg Intravenous Q6H PRN Donnie Mesa, MD        PHYSICAL EXAMINATION: ECOG PERFORMANCE STATUS: 1 - Symptomatic but completely ambulatory  There were no vitals filed for this visit. There were no vitals filed for this visit.  GENERAL: alert, no distress and comfortable SKIN: skin color, texture, turgor are normal, no rashes or significant lesions EYES: normal, Conjunctiva are pink and non-injected, sclera clear OROPHARYNX: no exudate, no erythema and lips, buccal mucosa, and tongue normal  NECK: supple, thyroid normal size, non-tender, without nodularity LYMPH: no palpable lymphadenopathy in the cervical, axillary or inguinal LUNGS: clear to auscultation and percussion with normal breathing effort HEART: regular rate & rhythm and no murmurs and no lower extremity edema ABDOMEN: abdomen soft, non-tender and normal bowel sounds MUSCULOSKELETAL: no cyanosis of digits and no clubbing  NEURO: alert & oriented x 3 with fluent speech, no focal motor/sensory deficits EXTREMITIES: No lower extremity edema  LABORATORY DATA:  I have reviewed the data as listed CMP Latest Ref Rng & Units 09/10/2019 07/30/2019 07/12/2019  Glucose 65 - 99 mg/dL 86 108(H) 111(H)  BUN 8 - 27 mg/dL _0 Creatinine 0.57 - 1.00 mg/dL 0.88 0.84 0.89  Sodium 134 - 144 mmol/L 141 141 141  Potassium 3.5 - 5.2 mmol/L 3.6 3.6 3.5  Chloride 96 - 106 mmol/L 101 102 104  CO2 20 - 29 mmol/L _1 Calcium 8.7 - 10.3 mg/dL 9.8 9.5 9.4  Total Protein 6.0 - 8.5 g/dL 6.5 - 6.5  Total Bilirubin 0.0 - 1.2 mg/dL 0.4 - 0.5  Alkaline Phos 39 - 117 IU/L 165(H) - 178(H)  AST 0 - 40 IU/L 36 - 60(H)  ALT 0 - 32 IU/L 21 - 80(H)    Lab  Results  Component Value Date   WBC 2.6 (L) 10/04/2019   HGB 10.1 (L) 10/04/2019   HCT 30.1 (L) 10/04/2019   MCV 106.0 (H) 10/04/2019   PLT 67 (L) 10/04/2019   NEUTROABS 1.1 (L) 10/04/2019    ASSESSMENT & PLAN:  Breast cancer of upper-outer quadrant of left female breast ([place]) Left breast invasive ductal carcinomaT2, N1, 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 Was on Arimidex since 08/22/2012 to 03/30/16 Recent treatment: Kadcyla Subcutaneous nodule  excisionleft chest: Infiltrating carcinoma breast primary, ER positive, PR negative, HER-2 positive  06/04/2018:3/9/2020andBone scan: Progression of metastatic disease involving the bone, renal lesion and lung nodule August 2020: Progression of brain metastases with vasogenic edema,  September 2020 stereotactic radiation seen Dr. Mickeal Skinner  Current treatment: Abemaciclibwith letrozole along with Herceptin(to be given every 4 weeks)and Xgeva CT CAP and bone scan 07/05/2019: Widespread bone metastases as before.  Stable disease no new lesions MRI brain 09/26/2019: Right frontal lobe white matter edema significantly improved.  Right frontal convexity dural metastases improved.  Radiology review: Continue with the current treatment. Slight numbness on the dorsum of the right foot: Clinically significant to make any changes to treatment.  Lab review: Thrombocytopenia and anemia and leukopenia stable Recheck scans in 3 months and follow-up with me.   No orders of the defined types were placed in this encounter.  The patient has a good understanding of the overall plan. she agrees with it. she will call with any problems that may develop before the next visit here.  Nicholas Lose, MD 10/04/2019  Julious Oka Dorshimer, am acting as scribe for Dr. Nicholas Lose.  I have reviewed the above  document for accuracy and completeness, and I agree with the above.

## 2019-10-04 ENCOUNTER — Inpatient Hospital Stay: Payer: Medicare Other

## 2019-10-04 ENCOUNTER — Other Ambulatory Visit: Payer: Self-pay

## 2019-10-04 ENCOUNTER — Inpatient Hospital Stay (HOSPITAL_BASED_OUTPATIENT_CLINIC_OR_DEPARTMENT_OTHER): Payer: Medicare Other | Admitting: Hematology and Oncology

## 2019-10-04 DIAGNOSIS — C7951 Secondary malignant neoplasm of bone: Secondary | ICD-10-CM

## 2019-10-04 DIAGNOSIS — C50412 Malignant neoplasm of upper-outer quadrant of left female breast: Secondary | ICD-10-CM

## 2019-10-04 DIAGNOSIS — I25118 Atherosclerotic heart disease of native coronary artery with other forms of angina pectoris: Secondary | ICD-10-CM

## 2019-10-04 DIAGNOSIS — Z79811 Long term (current) use of aromatase inhibitors: Secondary | ICD-10-CM | POA: Diagnosis not present

## 2019-10-04 DIAGNOSIS — Z95828 Presence of other vascular implants and grafts: Secondary | ICD-10-CM

## 2019-10-04 DIAGNOSIS — Z79899 Other long term (current) drug therapy: Secondary | ICD-10-CM | POA: Diagnosis not present

## 2019-10-04 DIAGNOSIS — Z170 Estrogen receptor positive status [ER+]: Secondary | ICD-10-CM

## 2019-10-04 DIAGNOSIS — Z923 Personal history of irradiation: Secondary | ICD-10-CM | POA: Diagnosis not present

## 2019-10-04 DIAGNOSIS — Z5112 Encounter for antineoplastic immunotherapy: Secondary | ICD-10-CM | POA: Diagnosis not present

## 2019-10-04 DIAGNOSIS — C50919 Malignant neoplasm of unspecified site of unspecified female breast: Secondary | ICD-10-CM

## 2019-10-04 DIAGNOSIS — R200 Anesthesia of skin: Secondary | ICD-10-CM | POA: Diagnosis not present

## 2019-10-04 DIAGNOSIS — Z5111 Encounter for antineoplastic chemotherapy: Secondary | ICD-10-CM | POA: Diagnosis present

## 2019-10-04 LAB — CBC WITH DIFFERENTIAL (CANCER CENTER ONLY)
Abs Immature Granulocytes: 0.01 10*3/uL (ref 0.00–0.07)
Basophils Absolute: 0 10*3/uL (ref 0.0–0.1)
Basophils Relative: 1 %
Eosinophils Absolute: 0.1 10*3/uL (ref 0.0–0.5)
Eosinophils Relative: 2 %
HCT: 30.1 % — ABNORMAL LOW (ref 36.0–46.0)
Hemoglobin: 10.1 g/dL — ABNORMAL LOW (ref 12.0–15.0)
Immature Granulocytes: 0 %
Lymphocytes Relative: 47 %
Lymphs Abs: 1.2 10*3/uL (ref 0.7–4.0)
MCH: 35.6 pg — ABNORMAL HIGH (ref 26.0–34.0)
MCHC: 33.6 g/dL (ref 30.0–36.0)
MCV: 106 fL — ABNORMAL HIGH (ref 80.0–100.0)
Monocytes Absolute: 0.2 10*3/uL (ref 0.1–1.0)
Monocytes Relative: 9 %
Neutro Abs: 1.1 10*3/uL — ABNORMAL LOW (ref 1.7–7.7)
Neutrophils Relative %: 41 %
Platelet Count: 67 10*3/uL — ABNORMAL LOW (ref 150–400)
RBC: 2.84 MIL/uL — ABNORMAL LOW (ref 3.87–5.11)
RDW: 13.2 % (ref 11.5–15.5)
WBC Count: 2.6 10*3/uL — ABNORMAL LOW (ref 4.0–10.5)
nRBC: 0 % (ref 0.0–0.2)

## 2019-10-04 LAB — CMP (CANCER CENTER ONLY)
ALT: 28 U/L (ref 0–44)
AST: 38 U/L (ref 15–41)
Albumin: 3.8 g/dL (ref 3.5–5.0)
Alkaline Phosphatase: 171 U/L — ABNORMAL HIGH (ref 38–126)
Anion gap: 7 (ref 5–15)
BUN: 16 mg/dL (ref 8–23)
CO2: 29 mmol/L (ref 22–32)
Calcium: 9.2 mg/dL (ref 8.9–10.3)
Chloride: 104 mmol/L (ref 98–111)
Creatinine: 0.91 mg/dL (ref 0.44–1.00)
GFR, Est AFR Am: >60 mL/min (ref >60)
GFR, Estimated: >60 mL/min (ref >60)
Glucose, Bld: 101 mg/dL — ABNORMAL HIGH (ref 70–99)
Potassium: 3.7 mmol/L (ref 3.5–5.1)
Sodium: 140 mmol/L (ref 135–145)
Total Bilirubin: 0.5 mg/dL (ref 0.3–1.2)
Total Protein: 6.9 g/dL (ref 6.5–8.1)

## 2019-10-04 MED ORDER — ACETAMINOPHEN 325 MG PO TABS
650.0000 mg | ORAL_TABLET | Freq: Once | ORAL | Status: AC
  Administered 2019-10-04: 10:00:00 650 mg via ORAL

## 2019-10-04 MED ORDER — PREDNISONE 50 MG PO TABS: ORAL_TABLET | ORAL | 0 refills | Status: DC

## 2019-10-04 MED ORDER — TRASTUZUMAB-DKST CHEMO 150 MG IV SOLR
6.0000 mg/kg | Freq: Once | INTRAVENOUS | Status: AC
  Administered 2019-10-04: 11:00:00 399 mg via INTRAVENOUS
  Filled 2019-10-04: qty 19

## 2019-10-04 MED ORDER — DIPHENHYDRAMINE HCL 25 MG PO CAPS
ORAL_CAPSULE | ORAL | Status: AC
  Filled 2019-10-04: qty 1

## 2019-10-04 MED ORDER — SODIUM CHLORIDE 0.9% FLUSH
10.0000 mL | INTRAVENOUS | Status: DC | PRN
  Administered 2019-10-04: 10 mL
  Filled 2019-10-04: qty 10

## 2019-10-04 MED ORDER — ACETAMINOPHEN 325 MG PO TABS
ORAL_TABLET | ORAL | Status: AC
  Filled 2019-10-04: qty 2

## 2019-10-04 MED ORDER — DENOSUMAB 120 MG/1.7ML ~~LOC~~ SOLN
SUBCUTANEOUS | Status: AC
  Filled 2019-10-04: qty 1.7

## 2019-10-04 MED ORDER — DIPHENHYDRAMINE HCL 25 MG PO CAPS
ORAL_CAPSULE | ORAL | Status: AC
  Filled 2019-10-04: qty 2

## 2019-10-04 MED ORDER — DIPHENHYDRAMINE HCL 25 MG PO CAPS
50.0000 mg | ORAL_CAPSULE | Freq: Once | ORAL | Status: AC
  Administered 2019-10-04: 50 mg via ORAL

## 2019-10-04 MED ORDER — HEPARIN SOD (PORK) LOCK FLUSH 100 UNIT/ML IV SOLN
500.0000 [IU] | Freq: Once | INTRAVENOUS | Status: AC | PRN
  Administered 2019-10-04: 12:00:00 500 [IU]
  Filled 2019-10-04: qty 5

## 2019-10-04 MED ORDER — SODIUM CHLORIDE 0.9 % IV SOLN
Freq: Once | INTRAVENOUS | Status: AC
  Filled 2019-10-04: qty 250

## 2019-10-04 MED ORDER — DENOSUMAB 120 MG/1.7ML ~~LOC~~ SOLN
120.0000 mg | Freq: Once | SUBCUTANEOUS | Status: AC
  Administered 2019-10-04: 11:00:00 120 mg via SUBCUTANEOUS

## 2019-10-04 MED ORDER — SODIUM CHLORIDE 0.9% FLUSH
10.0000 mL | Freq: Once | INTRAVENOUS | Status: AC
  Administered 2019-10-04: 10 mL
  Filled 2019-10-04: qty 10

## 2019-10-04 NOTE — Patient Instructions (Signed)
[facility] Discharge Instructions for Patients Receiving Chemotherapy  Today you received the following chemotherapy agents Trastuzumab (OGIVRI).  To help prevent nausea and vomiting after your treatment, we encourage you to take your nausea medication as prescribed.   If you develop nausea and vomiting that is not controlled by your nausea medication, call the clinic.   BELOW ARE SYMPTOMS THAT SHOULD BE REPORTED IMMEDIATELY:  *FEVER GREATER THAN 100.5 F  *CHILLS WITH OR WITHOUT FEVER  NAUSEA AND VOMITING THAT IS NOT CONTROLLED WITH YOUR NAUSEA MEDICATION  *UNUSUAL SHORTNESS OF BREATH  *UNUSUAL BRUISING OR BLEEDING  TENDERNESS IN MOUTH AND THROAT WITH OR WITHOUT PRESENCE OF ULCERS  *URINARY PROBLEMS  *BOWEL PROBLEMS  UNUSUAL RASH Items with * indicate a potential emergency and should be followed up as soon as possible.  Feel free to call the clinic should you have any questions or concerns. The clinic phone number is [phone].  Please show the CHEMO ALERT CARD at check-in to the Emergency Department and triage nurse.  Coronavirus (COVID-19) Are you at risk?  Are you at risk for the Coronavirus (COVID-19)?  To be considered HIGH RISK for Coronavirus (COVID-19), you have to meet the following criteria:  . Traveled to [place], [place], [name], [place] or [place]; or in the [place] to [place], [place], [place], or [place]; and have fever, cough, and shortness of breath within the last 2 weeks of travel OR . Been in close contact with a person diagnosed with COVID-19 within the last 2 weeks and have fever, cough, and shortness of breath . IF YOU DO NOT MEET THESE CRITERIA, YOU ARE CONSIDERED LOW RISK FOR COVID-19.  What to do if you are HIGH RISK for COVID-19?  . If you are having a medical emergency, call 911. . Seek medical care right away. Before you go to a doctor's office, urgent care or emergency department, call ahead and tell them  about your recent travel, contact with someone diagnosed with COVID-19, and your symptoms. You should receive instructions from your physician's office regarding next steps of care.  . When you arrive at healthcare provider, tell the healthcare staff immediately you have returned from visiting [place], [place], [place], [place] or [name]; or traveled in the [place] to [place], [place], [place], or [place]; in the last two weeks or you have been in close contact with a person diagnosed with COVID-19 in the last 2 weeks.   . Tell the health care staff about your symptoms: fever, cough and shortness of breath. . After you have been seen by a medical provider, you will be either: o Tested for (COVID-19) and discharged home on quarantine except to seek medical care if symptoms worsen, and asked to  - Stay home and avoid contact with others until you get your results (4-5 days)  - Avoid travel on public transportation if possible (such as bus, train, or airplane) or o Sent to the Emergency Department by EMS for evaluation, COVID-19 testing, and possible admission depending on your condition and test results.  What to do if you are LOW RISK for COVID-19?  Reduce your risk of any infection by using the same precautions used for avoiding the common cold or flu:  . Wash your hands often with soap and warm water for at least 20 seconds.  If soap and water are not readily available, use an alcohol-based hand sanitizer with at least 60% alcohol.  . If coughing or   sneezing, cover your mouth and nose by coughing or sneezing into the elbow areas of your shirt or coat, into a tissue or into your sleeve (not your hands). . Avoid shaking hands with others and consider head nods or verbal greetings only. . Avoid touching your eyes, nose, or mouth with unwashed hands.  . Avoid close contact with people who are sick. . Avoid places or events with large numbers of people in one location, like concerts or  sporting events. . Carefully consider travel plans you have or are making. . If you are planning any travel outside or inside the [name], visit the CDC's Travelers' Health webpage for the latest health notices. . If you have some symptoms but not all symptoms, continue to monitor at home and seek medical attention if your symptoms worsen. . If you are having a medical emergency, call 911.   [place] / e-Visit: [email or web address]         [facility]: [facility]: [phone]                   [facility]: [phone]

## 2019-10-04 NOTE — Assessment & Plan Note (Signed)
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 Was on Arimidex since 08/22/2012 to 03/30/16 Recent treatment: Kadcyla Subcutaneous nodule excisionleft chest: Infiltrating carcinoma breast primary, ER positive, PR negative, HER-2 positive  06/04/2018:3/9/2020andBone scan: Progression of metastatic disease involving the bone, renal lesion and lung nodule August 2020: Progression of brain metastases with vasogenic edema,  September 2020 stereotactic radiation seen Dr. Mickeal Skinner  Current treatment: Abemaciclibwith letrozole along with Herceptin(to be given every 4 weeks)and Xgeva CT CAP and bone scan 07/05/2019: Widespread bone metastases as before.  Stable disease no new lesions MRI brain 09/26/2019: Right frontal lobe white matter edema significantly improved.  Right frontal convexity dural metastases improved.  Radiology review: Continue with the current treatment.  Lab review:   Recheck scans in 3 months and follow-up with me.

## 2019-10-07 ENCOUNTER — Telehealth: Payer: Self-pay | Admitting: Hematology and Oncology

## 2019-10-07 NOTE — Telephone Encounter (Signed)
I talk with patient regarding schedule  

## 2019-10-14 ENCOUNTER — Other Ambulatory Visit: Payer: Self-pay | Admitting: *Deleted

## 2019-10-14 ENCOUNTER — Telehealth: Payer: Self-pay

## 2019-10-14 DIAGNOSIS — C7931 Secondary malignant neoplasm of brain: Secondary | ICD-10-CM

## 2019-10-14 NOTE — Telephone Encounter (Signed)
Oral Oncology Patient Advocate Encounter  Was successful in securing patient a $15000 grant from [employer] to provide copayment coverage for [employer].  This will keep the out of pocket expense at $0.     [employer] ID: [id number]   The billing information is as follows and has been shared with [facility].    [employer]: [id number] PCN: [id number] Member ID: [id number] Group ID: [id number] Dates of Eligibility: 10/14/19 through 09/08/20   [facility] Phone [phone] Fax [phone] 10/14/2019 11:01 AM

## 2019-10-16 ENCOUNTER — Other Ambulatory Visit: Payer: Self-pay | Admitting: Family Medicine

## 2019-10-16 ENCOUNTER — Other Ambulatory Visit: Payer: Self-pay | Admitting: Hematology and Oncology

## 2019-10-24 ENCOUNTER — Other Ambulatory Visit (HOSPITAL_COMMUNITY): Payer: Medicare Other

## 2019-10-28 ENCOUNTER — Ambulatory Visit: Payer: Self-pay | Admitting: Radiation Oncology

## 2019-10-30 ENCOUNTER — Other Ambulatory Visit: Payer: Self-pay | Admitting: Radiation Therapy

## 2019-11-01 ENCOUNTER — Other Ambulatory Visit: Payer: Self-pay

## 2019-11-01 ENCOUNTER — Inpatient Hospital Stay: Payer: Medicare Other | Attending: Hematology and Oncology

## 2019-11-01 VITALS — BP 102/67 | HR 75 | Temp 98.0°F | Resp 17 | Ht 68.0 in | Wt 140.8 lb

## 2019-11-01 DIAGNOSIS — Z170 Estrogen receptor positive status [ER+]: Secondary | ICD-10-CM | POA: Insufficient documentation

## 2019-11-01 DIAGNOSIS — Z5111 Encounter for antineoplastic chemotherapy: Secondary | ICD-10-CM | POA: Diagnosis not present

## 2019-11-01 DIAGNOSIS — C50919 Malignant neoplasm of unspecified site of unspecified female breast: Secondary | ICD-10-CM

## 2019-11-01 DIAGNOSIS — C50412 Malignant neoplasm of upper-outer quadrant of left female breast: Secondary | ICD-10-CM

## 2019-11-01 MED ORDER — DIPHENHYDRAMINE HCL 25 MG PO CAPS
ORAL_CAPSULE | ORAL | Status: AC
  Filled 2019-11-01: qty 2

## 2019-11-01 MED ORDER — HEPARIN SOD (PORK) LOCK FLUSH 100 UNIT/ML IV SOLN
500.0000 [IU] | Freq: Once | INTRAVENOUS | Status: AC | PRN
  Administered 2019-11-01: 500 [IU]
  Filled 2019-11-01: qty 5

## 2019-11-01 MED ORDER — DIPHENHYDRAMINE HCL 25 MG PO CAPS
50.0000 mg | ORAL_CAPSULE | Freq: Once | ORAL | Status: AC
  Administered 2019-11-01: 50 mg via ORAL

## 2019-11-01 MED ORDER — ACETAMINOPHEN 325 MG PO TABS
ORAL_TABLET | ORAL | Status: AC
  Filled 2019-11-01: qty 2

## 2019-11-01 MED ORDER — TRASTUZUMAB-DKST CHEMO 150 MG IV SOLR
6.0000 mg/kg | Freq: Once | INTRAVENOUS | Status: AC
  Administered 2019-11-01: 399 mg via INTRAVENOUS
  Filled 2019-11-01: qty 19

## 2019-11-01 MED ORDER — SODIUM CHLORIDE 0.9% FLUSH
10.0000 mL | INTRAVENOUS | Status: DC | PRN
  Administered 2019-11-01: 10 mL
  Filled 2019-11-01: qty 10

## 2019-11-01 MED ORDER — ACETAMINOPHEN 325 MG PO TABS
650.0000 mg | ORAL_TABLET | Freq: Once | ORAL | Status: AC
  Administered 2019-11-01: 650 mg via ORAL

## 2019-11-01 MED ORDER — SODIUM CHLORIDE 0.9 % IV SOLN
Freq: Once | INTRAVENOUS | Status: AC
  Filled 2019-11-01: qty 250

## 2019-11-01 NOTE — Patient Instructions (Signed)
Trastuzumab injection for infusion What is this medicine? TRASTUZUMAB (tras TOO zoo mab) is a monoclonal antibody. It is used to treat breast cancer and stomach cancer. This medicine may be used for other purposes; ask your health care provider or pharmacist if you have questions. COMMON BRAND NAME(S): Herceptin, Herzuma, KANJINTI, Ogivri, Ontruzant, Trazimera What should I tell my health care provider before I take this medicine? They need to know if you have any of these conditions:  heart disease  heart failure  lung or breathing disease, like asthma  an unusual or allergic reaction to trastuzumab, benzyl alcohol, or other medications, foods, dyes, or preservatives  pregnant or trying to get pregnant  breast-feeding How should I use this medicine? This drug is given as an infusion into a vein. It is administered in a hospital or clinic by a specially trained health care professional. Talk to your pediatrician regarding the use of this medicine in children. This medicine is not approved for use in children. Overdosage: If you think you have taken too much of this medicine contact a poison control center or emergency room at once. NOTE: This medicine is only for you. Do not share this medicine with others. What if I miss a dose? It is important not to miss a dose. Call your doctor or health care professional if you are unable to keep an appointment. What may interact with this medicine? This medicine may interact with the following medications:  certain types of chemotherapy, such as daunorubicin, doxorubicin, epirubicin, and idarubicin This list may not describe all possible interactions. Give your health care provider a list of all the medicines, herbs, non-prescription drugs, or dietary supplements you use. Also tell them if you smoke, drink alcohol, or use illegal drugs. Some items may interact with your medicine. What should I watch for while using this medicine? Visit your  doctor for checks on your progress. Report any side effects. Continue your course of treatment even though you feel ill unless your doctor tells you to stop. Call your doctor or health care professional for advice if you get a fever, chills or sore throat, or other symptoms of a cold or flu. Do not treat yourself. Try to avoid being around people who are sick. You may experience fever, chills and shaking during your first infusion. These effects are usually mild and can be treated with other medicines. Report any side effects during the infusion to your health care professional. Fever and chills usually do not happen with later infusions. Do not become pregnant while taking this medicine or for 7 months after stopping it. Women should inform their doctor if they wish to become pregnant or think they might be pregnant. Women of child-bearing potential will need to have a negative pregnancy test before starting this medicine. There is a potential for serious side effects to an unborn child. Talk to your health care professional or pharmacist for more information. Do not breast-feed an infant while taking this medicine or for 7 months after stopping it. Women must use effective birth control with this medicine. What side effects may I notice from receiving this medicine? Side effects that you should report to your doctor or health care professional as soon as possible:  allergic reactions like skin rash, itching or hives, swelling of the face, lips, or tongue  chest pain or palpitations  cough  dizziness  feeling faint or lightheaded, falls  fever  general ill feeling or flu-like symptoms  signs of worsening heart failure like   breathing problems; swelling in your legs and feet  unusually weak or tired Side effects that usually do not require medical attention (report to your doctor or health care professional if they continue or are bothersome):  bone pain  changes in  taste  diarrhea  joint pain  nausea/vomiting  weight loss This list may not describe all possible side effects. Call your doctor for medical advice about side effects. You may report side effects to FDA at 1-800-FDA-1088. Where should I keep my medicine? This drug is given in a hospital or clinic and will not be stored at home. NOTE: This sheet is a summary. It may not cover all possible information. If you have questions about this medicine, talk to your doctor, pharmacist, or health care provider.  2020 Elsevier/Gold Standard (2016-09-27 14:37:52)  Coronavirus (COVID-19) Are you at risk?  Are you at risk for the Coronavirus (COVID-19)?  To be considered HIGH RISK for Coronavirus (COVID-19), you have to meet the following criteria:  . Traveled to [place], [place], [name], [place] or [place]; or in the [place] to [place], [place], [place], or [place]; and have fever, cough, and shortness of breath within the last 2 weeks of travel OR . Been in close contact with a person diagnosed with COVID-19 within the last 2 weeks and have fever, cough, and shortness of breath . IF YOU DO NOT MEET THESE CRITERIA, YOU ARE CONSIDERED LOW RISK FOR COVID-19.  What to do if you are HIGH RISK for COVID-19?  . If you are having a medical emergency, call 911. . Seek medical care right away. Before you go to a doctor's office, urgent care or emergency department, call ahead and tell them about your recent travel, contact with someone diagnosed with COVID-19, and your symptoms. You should receive instructions from your physician's office regarding next steps of care.  . When you arrive at healthcare provider, tell the healthcare staff immediately you have returned from visiting [place], [place], [place], [place] or [name]; or traveled in the [place] to [place], [place], [place], or [place]; in the last two weeks or you have been in close contact with a person diagnosed with COVID-19  in the last 2 weeks.   . Tell the health care staff about your symptoms: fever, cough and shortness of breath. . After you have been seen by a medical provider, you will be either: o Tested for (COVID-19) and discharged home on quarantine except to seek medical care if symptoms worsen, and asked to  - Stay home and avoid contact with others until you get your results (4-5 days)  - Avoid travel on public transportation if possible (such as bus, train, or airplane) or o Sent to the Emergency Department by EMS for evaluation, COVID-19 testing, and possible admission depending on your condition and test results.  What to do if you are LOW RISK for COVID-19?  Reduce your risk of any infection by using the same precautions used for avoiding the common cold or flu:  . Wash your hands often with soap and warm water for at least 20 seconds.  If soap and water are not readily available, use an alcohol-based hand sanitizer with at least 60% alcohol.  . If coughing or sneezing, cover your mouth and nose by coughing or sneezing into the elbow areas of your shirt or coat, into a tissue or into your sleeve (not your hands). . Avoid shaking hands with others and consider head nods   or verbal greetings only. . Avoid touching your eyes, nose, or mouth with unwashed hands.  . Avoid close contact with people who are sick. . Avoid places or events with large numbers of people in one location, like concerts or sporting events. . Carefully consider travel plans you have or are making. . If you are planning any travel outside or inside the [place], visit the CDC's Travelers' Health webpage for the latest health notices. . If you have some symptoms but not all symptoms, continue to monitor at home and seek medical attention if your symptoms worsen. . If you are having a medical emergency, call 911.   ADDITIONAL HEALTHCARE OPTIONS FOR PATIENTS  [facility] / e-Visit:  [email or web address]         [facility]: [phone]  [facility]: [phone]                   [facility]: [phone]   

## 2019-11-11 ENCOUNTER — Other Ambulatory Visit: Payer: Self-pay | Admitting: Hematology and Oncology

## 2019-11-11 DIAGNOSIS — C50412 Malignant neoplasm of upper-outer quadrant of left female breast: Secondary | ICD-10-CM

## 2019-11-11 DIAGNOSIS — Z170 Estrogen receptor positive status [ER+]: Secondary | ICD-10-CM

## 2019-11-21 ENCOUNTER — Other Ambulatory Visit: Payer: Self-pay | Admitting: Hematology and Oncology

## 2019-11-29 ENCOUNTER — Other Ambulatory Visit: Payer: Self-pay | Admitting: *Deleted

## 2019-11-29 ENCOUNTER — Other Ambulatory Visit: Payer: Self-pay

## 2019-11-29 ENCOUNTER — Inpatient Hospital Stay: Payer: Medicare Other | Attending: Hematology and Oncology

## 2019-11-29 VITALS — BP 123/77 | HR 73 | Temp 98.0°F | Resp 18

## 2019-11-29 DIAGNOSIS — C50412 Malignant neoplasm of upper-outer quadrant of left female breast: Secondary | ICD-10-CM | POA: Insufficient documentation

## 2019-11-29 DIAGNOSIS — Z5112 Encounter for antineoplastic immunotherapy: Secondary | ICD-10-CM | POA: Insufficient documentation

## 2019-11-29 DIAGNOSIS — Z170 Estrogen receptor positive status [ER+]: Secondary | ICD-10-CM

## 2019-11-29 DIAGNOSIS — C50919 Malignant neoplasm of unspecified site of unspecified female breast: Secondary | ICD-10-CM

## 2019-11-29 MED ORDER — TRASTUZUMAB-DKST CHEMO 150 MG IV SOLR
6.0000 mg/kg | Freq: Once | INTRAVENOUS | Status: AC
  Administered 2019-11-29: 15:00:00 399 mg via INTRAVENOUS
  Filled 2019-11-29: qty 19

## 2019-11-29 MED ORDER — HEPARIN SOD (PORK) LOCK FLUSH 100 UNIT/ML IV SOLN
500.0000 [IU] | Freq: Once | INTRAVENOUS | Status: AC | PRN
  Administered 2019-11-29: 15:00:00 500 [IU]
  Filled 2019-11-29: qty 5

## 2019-11-29 MED ORDER — PREDNISONE 50 MG PO TABS: ORAL_TABLET | ORAL | 0 refills | Status: DC

## 2019-11-29 MED ORDER — ACETAMINOPHEN 325 MG PO TABS
ORAL_TABLET | ORAL | Status: AC
  Filled 2019-11-29: qty 2

## 2019-11-29 MED ORDER — ACETAMINOPHEN 325 MG PO TABS
650.0000 mg | ORAL_TABLET | Freq: Once | ORAL | Status: AC
  Administered 2019-11-29: 14:00:00 650 mg via ORAL

## 2019-11-29 MED ORDER — DIPHENHYDRAMINE HCL 25 MG PO CAPS
50.0000 mg | ORAL_CAPSULE | Freq: Once | ORAL | Status: AC
  Administered 2019-11-29: 50 mg via ORAL

## 2019-11-29 MED ORDER — SODIUM CHLORIDE 0.9% FLUSH
10.0000 mL | INTRAVENOUS | Status: DC | PRN
  Administered 2019-11-29: 15:00:00 10 mL
  Filled 2019-11-29: qty 10

## 2019-11-29 MED ORDER — SODIUM CHLORIDE 0.9 % IV SOLN
Freq: Once | INTRAVENOUS | Status: AC
  Filled 2019-11-29: qty 250

## 2019-11-29 MED ORDER — DIPHENHYDRAMINE HCL 25 MG PO CAPS
ORAL_CAPSULE | ORAL | Status: AC
  Filled 2019-11-29: qty 2

## 2019-11-29 NOTE — Patient Instructions (Signed)
Coronavirus (COVID-19) Are you at risk?  Are you at risk for the Coronavirus (COVID-19)?  To be considered HIGH RISK for Coronavirus (COVID-19), you have to meet the following criteria:  . Traveled to [place], [place], [name], [place] or [place]; or in the [place] to [place], [place], [place], or [place]; and have fever, cough, and shortness of breath within the last 2 weeks of travel OR . Been in close contact with a person diagnosed with COVID-19 within the last 2 weeks and have fever, cough, and shortness of breath . IF YOU DO NOT MEET THESE CRITERIA, YOU ARE CONSIDERED LOW RISK FOR COVID-19.  What to do if you are HIGH RISK for COVID-19?  . If you are having a medical emergency, call 911. . Seek medical care right away. Before you go to a doctor's office, urgent care or emergency department, call ahead and tell them about your recent travel, contact with someone diagnosed with COVID-19, and your symptoms. You should receive instructions from your physician's office regarding next steps of care.  . When you arrive at healthcare provider, tell the healthcare staff immediately you have returned from visiting [place], [place], [place], [place] or [name]; or traveled in the [place] to [place], [place], [place], or [place]; in the last two weeks or you have been in close contact with a person diagnosed with COVID-19 in the last 2 weeks.   . Tell the health care staff about your symptoms: fever, cough and shortness of breath. . After you have been seen by a medical provider, you will be either: o Tested for (COVID-19) and discharged home on quarantine except to seek medical care if symptoms worsen, and asked to  - Stay home and avoid contact with others until you get your results (4-5 days)  - Avoid travel on public transportation if possible (such as bus, train, or airplane) or o Sent to the Emergency Department by EMS for evaluation, COVID-19 testing, and possible  admission depending on your condition and test results.  What to do if you are LOW RISK for COVID-19?  Reduce your risk of any infection by using the same precautions used for avoiding the common cold or flu:  . Wash your hands often with soap and warm water for at least 20 seconds.  If soap and water are not readily available, use an alcohol-based hand sanitizer with at least 60% alcohol.  . If coughing or sneezing, cover your mouth and nose by coughing or sneezing into the elbow areas of your shirt or coat, into a tissue or into your sleeve (not your hands). . Avoid shaking hands with others and consider head nods or verbal greetings only. . Avoid touching your eyes, nose, or mouth with unwashed hands.  . Avoid close contact with people who are sick. . Avoid places or events with large numbers of people in one location, like concerts or sporting events. . Carefully consider travel plans you have or are making. . If you are planning any travel outside or inside the [place], visit the CDC's Travelers' Health webpage for the latest health notices. . If you have some symptoms but not all symptoms, continue to monitor at home and seek medical attention if your symptoms worsen. . If you are having a medical emergency, call 911.   ADDITIONAL HEALTHCARE OPTIONS FOR PATIENTS  [facility] / e-Visit: [email or web address]         [facility]: [phone]  [facility]   Urgent Care: [facility]: [facility] Discharge Instructions for Patients Receiving Chemotherapy  Today you received the following chemotherapy agents Trastuzumab  To help prevent nausea and vomiting after your treatment, we encourage you to take your nausea medication as directed.   If you develop nausea and vomiting that is not controlled by your nausea medication, call the clinic.   BELOW ARE  SYMPTOMS THAT SHOULD BE REPORTED IMMEDIATELY:  *FEVER GREATER THAN 100.5 F  *CHILLS WITH OR WITHOUT FEVER  NAUSEA AND VOMITING THAT IS NOT CONTROLLED WITH YOUR NAUSEA MEDICATION  *UNUSUAL SHORTNESS OF BREATH  *UNUSUAL BRUISING OR BLEEDING  TENDERNESS IN MOUTH AND THROAT WITH OR WITHOUT PRESENCE OF ULCERS  *URINARY PROBLEMS  *BOWEL PROBLEMS  UNUSUAL RASH Items with * indicate a potential emergency and should be followed up as soon as possible.  Feel free to call the clinic should you have any questions or concerns. The clinic phone number is [phone].  Please show the [facility] at check-in to the Emergency Department and triage nurse.

## 2019-12-02 ENCOUNTER — Telehealth: Payer: Self-pay

## 2019-12-02 ENCOUNTER — Other Ambulatory Visit: Payer: Self-pay | Admitting: Pharmacist

## 2019-12-02 DIAGNOSIS — Z170 Estrogen receptor positive status [ER+]: Secondary | ICD-10-CM

## 2019-12-02 DIAGNOSIS — C50412 Malignant neoplasm of upper-outer quadrant of left female breast: Secondary | ICD-10-CM

## 2019-12-02 MED ORDER — ABEMACICLIB 150 MG PO TABS: 150.0000 mg | ORAL_TABLET | Freq: Two times a day (BID) | ORAL | 0 refills | Status: DC

## 2019-12-02 NOTE — Progress Notes (Signed)
Oral Chemotherapy Pharmacist Encounter   Refill for Verzenio sent to [facility], patient is no longer filled medication using [facility] patient assistance. She will fill her medication at [facility] using a copay grant. Refill prescription redirected to [facility].  Darl Pikes, PharmD, BCPS, [facility] Hematology/Oncology Clinical Pharmacist [facility]/AP [facility] [phone]  12/02/2019 12:48 PM

## 2019-12-02 NOTE — Telephone Encounter (Signed)
Oral Oncology Patient Advocate Encounter  Received notification from [facility] that prior authorization for Verzenio is required.  PA submitted on CoverMyMeds Key [id number] Status is pending  Oral Oncology Clinic will continue to follow.  [facility] Patient [place] Phone [phone] Fax [phone] 12/02/2019 12:36 PM

## 2019-12-02 NOTE — Telephone Encounter (Signed)
Oral Oncology Patient Advocate Encounter  Prior Authorization for [name] has been approved.    PA# [id number] Effective dates: 12/02/19 through 05/30/20  Patients co-pay is $[id number]  Oral Oncology Clinic will continue to follow.

## 2019-12-02 NOTE — Telephone Encounter (Signed)
Oral Oncology Patient Advocate Encounter   Was successful in securing patient an $58 grant from [facility]) to provide copayment coverage for [employer].  This will keep the out of pocket expense at $0.     I have spoken with the patient.    The billing information is as follows and has been shared with [facility].   Member ID: [id number] Group ID: [id number] RxBin: [id number] Dates of Eligibility: 09/03/19 through 11/30/20  Fund:  [facility] Patient [place] Phone [phone] Fax [phone] 12/02/2019 1:00 PM

## 2019-12-03 MED FILL — VERZENIO 150 MG TAB: 150 | 28 days supply | Qty: 56 | Fill #0

## 2019-12-11 ENCOUNTER — Other Ambulatory Visit: Payer: Self-pay

## 2019-12-12 ENCOUNTER — Other Ambulatory Visit: Payer: Self-pay

## 2019-12-12 ENCOUNTER — Encounter: Payer: Self-pay | Admitting: Family Medicine

## 2019-12-12 ENCOUNTER — Ambulatory Visit (INDEPENDENT_AMBULATORY_CARE_PROVIDER_SITE_OTHER): Payer: Medicare Other | Admitting: Family Medicine

## 2019-12-12 VITALS — BP 128/72 | HR 71 | Temp 98.0°F | Ht 68.0 in | Wt 140.8 lb

## 2019-12-12 DIAGNOSIS — Z170 Estrogen receptor positive status [ER+]: Secondary | ICD-10-CM | POA: Diagnosis not present

## 2019-12-12 DIAGNOSIS — H6123 Impacted cerumen, bilateral: Secondary | ICD-10-CM

## 2019-12-12 DIAGNOSIS — C50919 Malignant neoplasm of unspecified site of unspecified female breast: Secondary | ICD-10-CM

## 2019-12-12 DIAGNOSIS — I10 Essential (primary) hypertension: Secondary | ICD-10-CM

## 2019-12-12 DIAGNOSIS — E782 Mixed hyperlipidemia: Secondary | ICD-10-CM

## 2019-12-12 DIAGNOSIS — E039 Hypothyroidism, unspecified: Secondary | ICD-10-CM | POA: Diagnosis not present

## 2019-12-12 DIAGNOSIS — C50412 Malignant neoplasm of upper-outer quadrant of left female breast: Secondary | ICD-10-CM | POA: Diagnosis not present

## 2019-12-12 MED ORDER — VALSARTAN 80 MG PO TABS: 80.0000 mg | ORAL_TABLET | Freq: Every day | ORAL | 1 refills | Status: DC

## 2019-12-12 MED ORDER — ROSUVASTATIN CALCIUM 5 MG PO TABS: 5.0000 mg | ORAL_TABLET | Freq: Every day | ORAL | 1 refills | Status: DC

## 2019-12-12 MED ORDER — LEVOTHYROXINE SODIUM 100 MCG PO TABS: ORAL_TABLET | ORAL | 3 refills | Status: DC

## 2019-12-12 MED ORDER — METOPROLOL SUCCINATE ER 100 MG PO TB24: 100.0000 mg | ORAL_TABLET | Freq: Every day | ORAL | 3 refills | Status: DC

## 2019-12-12 NOTE — Progress Notes (Addendum)
Subjective:  Patient ID: [name] Riley, female    DOB: 12-15-1950  Age: 69 y.o. MRN: [id number]  CC: Follow-up (3 month)   HPI Meriem Mielnik Schicker presents for  follow-up of hypertension. Patient has no history of headache chest pain or shortness of breath or recent cough. Patient also denies symptoms of TIA such as focal numbness or weakness. Patient denies side effects from medication. States taking it regularly.  Patient presents for follow-up on  thyroid. The patient has a history of hypothyroidism for many years. It has been stable recently. Pt. denies any change in  voice, loss of hair, heat or cold intolerance. Energy level has been adequate to good. Patient denies constipation and diarrhea. No myxedema. Medication is as noted below. Verified that pt is taking it daily on an empty stomach. Well tolerated.  Patient in for follow-up of elevated cholesterol. Doing well without complaints on current medication. Denies side effects of statin including myalgia and arthralgia and nausea. Also in today for liver function testing. Currently no chest pain, shortness of breath or other cardiovascular related symptoms noted.  Patient is also excited that she was told that her brain tumor was shrinking.  She heard from her PA and oncology a few days ago.  This is a metastasis from previous breast cancer.  History Libi has a past medical history of Allergy, Breast cancer ([place]) (08/25/2011), Cancer [facility]), Coronary artery disease (2001), Heart attack ([place]) (09/2000), History of chemotherapy (comp. 08/22/2012), Hyperlipidemia, Hypertension, Hypothyroidism, PONV (postoperative nausea and vomiting), and Status post radiation therapy (07/09/12 - 08/22/2012).   She has a past surgical history that includes Tonsillectomy (1968); Ovarian cyst surgery (Right, 1970); skin tags (05/09/1997); Portacath placement (11/14/2011); Breast surgery (1998); Breast surgery (11/14/11); Abdominal hysterectomy (1998); Appendectomy  (1970); Port-a-cath removal (08/30/2012); Mastectomy (Bilateral); Portacath placement (N/A, 04/25/2016); Mass excision (Left, 06/22/2017); and Incision and drainage of wound (Left, 07/01/2017).   Her family history includes Cancer in her cousin, paternal aunt, paternal grandfather, and paternal uncle; Cancer (age of onset: 42) in her father; Diabetes in her maternal grandmother; Hypertension in her maternal grandmother and mother.She reports that she has never smoked. She has never used smokeless tobacco. She reports that she does not drink alcohol or use drugs.  Current Outpatient Medications on File Prior to Visit  Medication Sig Dispense Refill  . abemaciclib (VERZENIO) 150 MG tablet Take 1 tablet (150 mg total) by mouth 2 (two) times daily. 56 tablet 0  . acetaminophen (TYLENOL) 500 MG tablet Take 1,000 mg by mouth every 6 (six) hours as needed for moderate pain.    . cholecalciferol (VITAMIN D) 1000 units tablet Take 1 tablet (1,000 Units total) by mouth daily.    . diphenhydrAMINE (BENADRYL) 25 MG tablet Take 50 mg by mouth at bedtime as needed for sleep.    Marland Kitchen letrozole (FEMARA) 2.5 MG tablet TAKE ONE (1) TABLET EACH DAY 90 tablet 3  . levETIRAcetam (KEPPRA) 500 MG tablet TAKE ONE TABLET BY MOUTH TWICE DAILY 60 tablet 2  . lidocaine-prilocaine (EMLA) cream APPLY TOPICALLY AS NEEDED FOR PORT ACCESS 30 g 3  . Multiple Vitamins-Minerals (MULTIVITAMIN WITH MINERALS) tablet Take 1 tablet by mouth daily.      . ondansetron (ZOFRAN-ODT) 4 MG disintegrating tablet TAKE 1 TABLET EVERY 6 HOURS AS NEEDED FOR NAUSEA AND VOMITING 30 tablet 3  . predniSONE (DELTASONE) 50 MG tablet For contrast dye allergy 3 tablet 0   Current Facility-Administered Medications on File Prior to Visit  Medication Dose  Route Frequency Provider Last Rate Last Admin  . 0.9 %  sodium chloride infusion   Intravenous Continuous Donnie Mesa, MD      . acetaminophen (TYLENOL) tablet 975 mg  975 mg Oral Q6H Donnie Mesa, MD      .  enoxaparin (LOVENOX) injection 40 mg  40 mg Subcutaneous Q24H Donnie Mesa, MD      . morphine 4 MG/ML injection 2-4 mg  2-4 mg Intravenous Q2H PRN Donnie Mesa, MD      . ondansetron (ZOFRAN-ODT) disintegrating tablet 4 mg  4 mg Oral Q6H PRN Donnie Mesa, MD       Or  . ondansetron (ZOFRAN) 4 mg in sodium chloride 0.9 % 50 mL IVPB  4 mg Intravenous Q6H PRN Donnie Mesa, MD      . oxyCODONE (Oxy IR/ROXICODONE) immediate release tablet 5-10 mg  5-10 mg Oral Q4H PRN Donnie Mesa, MD      . piperacillin-tazobactam (ZOSYN) 3.375 g in dextrose 5 % 50 mL IVPB  3.375 g Intravenous Q8H Donnie Mesa, MD      . prochlorperazine (COMPAZINE) tablet 10 mg  10 mg Oral Q6H PRN Donnie Mesa, MD       Or  . prochlorperazine (COMPAZINE) injection 5-10 mg  5-10 mg Intravenous Q6H PRN Donnie Mesa, MD        ROS Review of Systems  Constitutional: Negative.   HENT: Negative for congestion.   Eyes: Negative for visual disturbance.  Respiratory: Negative for shortness of breath.   Cardiovascular: Negative for chest pain.  Gastrointestinal: Negative for abdominal pain, constipation, diarrhea, nausea and vomiting.  Genitourinary: Negative for difficulty urinating.  Musculoskeletal: Negative for arthralgias and myalgias.  Neurological: Negative for headaches.  Psychiatric/Behavioral: Negative for sleep disturbance.    Objective:  BP 128/72   Pulse 71   Temp 98 F (36.7 C) (Temporal)   Ht 5\' 8"  (1.727 m)   Wt 140 lb 12.8 oz (63.9 kg)   BMI 21.41 kg/m   BP Readings from Last 3 Encounters:  12/12/19 128/72  11/29/19 123/77  11/01/19 102/67    Wt Readings from Last 3 Encounters:  12/12/19 140 lb 12.8 oz (63.9 kg)  11/01/19 140 lb 12 oz (63.8 kg)  10/04/19 141 lb 8 oz (64.2 kg)     Physical Exam Constitutional:      General: She is not in acute distress.    Appearance: She is well-developed.  HENT:     Head: Normocephalic and atraumatic.     Right Ear: External ear normal.      Left Ear: External ear normal.     Nose: Nose normal.  Eyes:     Conjunctiva/sclera: Conjunctivae normal.     Pupils: Pupils are equal, round, and reactive to light.  Neck:     Thyroid: No thyromegaly.  Cardiovascular:     Rate and Rhythm: Normal rate and regular rhythm.     Heart sounds: Normal heart sounds. No murmur.  Pulmonary:     Effort: Pulmonary effort is normal. No respiratory distress.     Breath sounds: Normal breath sounds. No wheezing or rales.  Abdominal:     General: Bowel sounds are normal. There is no distension.     Palpations: Abdomen is soft.     Tenderness: There is no abdominal tenderness.  Musculoskeletal:     Cervical back: Normal range of motion and neck supple.  Lymphadenopathy:     Cervical: No cervical adenopathy.  Skin:    General: Skin  is warm and dry.  Neurological:     Mental Status: She is alert and oriented to person, place, and time.     Deep Tendon Reflexes: Reflexes are normal and symmetric.  Psychiatric:        Behavior: Behavior normal.        Thought Content: Thought content normal.        Judgment: Judgment normal.       Assessment & Plan:   Nyelle was seen today for follow-up.  Diagnoses and all orders for this visit:  Malignant neoplasm of upper-outer quadrant of left breast in female, estrogen receptor positive ([place])  Essential hypertension -     valsartan (DIOVAN) 80 MG tablet; Take 1 tablet (80 mg total) by mouth daily. for blood pressure -     metoprolol succinate (TOPROL-XL) 100 MG 24 hr tablet; Take 1 tablet (100 mg total) by mouth daily.  Hypothyroidism, unspecified type -     levothyroxine (SYNTHROID) 100 MCG tablet; TAKE ONE TABLET EACH MORNING BEFORE BREAKFAST  Bilateral impacted cerumen  Metastatic breast cancer (HCC) -     metoprolol succinate (TOPROL-XL) 100 MG 24 hr tablet; Take 1 tablet (100 mg total) by mouth daily.  Mixed hyperlipidemia -     rosuvastatin (CRESTOR) 5 MG tablet; Take 1 tablet (5 mg  total) by mouth daily at 6 PM. For cholesterol   Allergies as of 12/12/2019      Reactions   Cantaloupe Extract Allergy Skin Test Shortness Of Breath   Contrast Media [iodinated Diagnostic Agents] Shortness Of Breath, Rash   Pravastatin Other (See Comments)   Legs hurt   Zosyn [piperacillin Sod-tazobactam So] Rash, Other (See Comments)   Temperature increase, facial flushing      Medication List       Accurate as of December 12, 2019 12:09 PM. If you have any questions, ask your nurse or doctor.        abemaciclib 150 MG tablet Commonly known as: Verzenio Take 1 tablet (150 mg total) by mouth 2 (two) times daily.   acetaminophen 500 MG tablet Commonly known as: TYLENOL Take 1,000 mg by mouth every 6 (six) hours as needed for moderate pain.   cholecalciferol 1000 units tablet Commonly known as: VITAMIN D Take 1 tablet (1,000 Units total) by mouth daily.   diphenhydrAMINE 25 MG tablet Commonly known as: BENADRYL Take 50 mg by mouth at bedtime as needed for sleep.   letrozole 2.5 MG tablet Commonly known as: FEMARA TAKE ONE (1) TABLET EACH DAY   levETIRAcetam 500 MG tablet Commonly known as: KEPPRA TAKE ONE TABLET BY MOUTH TWICE DAILY   levothyroxine 100 MCG tablet Commonly known as: SYNTHROID TAKE ONE TABLET EACH MORNING BEFORE BREAKFAST   lidocaine-prilocaine cream Commonly known as: EMLA APPLY TOPICALLY AS NEEDED FOR PORT ACCESS   metoprolol succinate 100 MG 24 hr tablet Commonly known as: TOPROL-XL Take 1 tablet (100 mg total) by mouth daily.   multivitamin with minerals tablet Take 1 tablet by mouth daily.   ondansetron 4 MG disintegrating tablet Commonly known as: ZOFRAN-ODT TAKE 1 TABLET EVERY 6 HOURS AS NEEDED FOR NAUSEA AND VOMITING   predniSONE 50 MG tablet Commonly known as: DELTASONE For contrast dye allergy   rosuvastatin 5 MG tablet Commonly known as: Crestor Take 1 tablet (5 mg total) by mouth daily at 6 PM. For cholesterol   valsartan  80 MG tablet Commonly known as: DIOVAN Take 1 tablet (80 mg total) by mouth daily. for blood pressure  Meds ordered this encounter  Medications  . valsartan (DIOVAN) 80 MG tablet    Sig: Take 1 tablet (80 mg total) by mouth daily. for blood pressure    Dispense:  90 tablet    Refill:  1  . rosuvastatin (CRESTOR) 5 MG tablet    Sig: Take 1 tablet (5 mg total) by mouth daily at 6 PM. For cholesterol    Dispense:  90 tablet    Refill:  1  . levothyroxine (SYNTHROID) 100 MCG tablet    Sig: TAKE ONE TABLET EACH MORNING BEFORE BREAKFAST    Dispense:  90 tablet    Refill:  3  . metoprolol succinate (TOPROL-XL) 100 MG 24 hr tablet    Sig: Take 1 tablet (100 mg total) by mouth daily.    Dispense:  90 tablet    Refill:  3   Patient stable with regard to her cholesterol and hypertension.  Those medicines were renewed after evaluation.  Blood work is pending.  She is also stable with regard to her Synthroid.  Medication to be renewed should it be necessary to change her dose.  This will be based on return of her lab results.  Her breast cancer is improving with regard to the brain mass as noted above.  She still has more treatments ago.  Cerumen impaction was treated bilaterally with lavage.  There was no significant relief.  Therefore home treatment for a week with Debrox was advised.  She will follow-up at the end of that time for repeat lavage.  Follow-up: Return in about 6 weeks (around 01/23/2020), or if symptoms worsen or fail to improve.  [name], M.D.

## 2019-12-12 NOTE — Addendum Note (Signed)
Addended by: Wardell Heath on: 12/12/2019 03:25 PM   Modules accepted: Orders

## 2019-12-12 NOTE — Patient Instructions (Signed)
Earwax removal: ° °Debrox drops are available without a prescription at your pharmacy. ° °Lay on your side with the ear up that you want to treat. Place for 5 drops of the Debrox in the ear canal and lay still for 15 minutes. After that time you considered up and allow the excess to run out of the year and catch it with a Kleenex. Repeat this with the other ear if needed. ° °Repeat this process daily for 1 week. By that time the ear should feel less clogged and her hearing should be better, if not, follow up in the office for recheck of the ear. ° °Thanks, °Maysen Bonsignore °

## 2019-12-13 LAB — CMP14+EGFR
ALT: 29 IU/L (ref 0–32)
AST: 46 IU/L — ABNORMAL HIGH (ref 0–40)
Albumin/Globulin Ratio: 1.8 (ref 1.2–2.2)
Albumin: 4.2 g/dL (ref 3.8–4.8)
Alkaline Phosphatase: 216 IU/L — ABNORMAL HIGH (ref 39–117)
BUN/Creatinine Ratio: 23 (ref 12–28)
BUN: 20 mg/dL (ref 8–27)
Bilirubin Total: 0.3 mg/dL (ref 0.0–1.2)
CO2: 27 mmol/L (ref 20–29)
Calcium: 9.6 mg/dL (ref 8.7–10.3)
Chloride: 102 mmol/L (ref 96–106)
Creatinine, Ser: 0.87 mg/dL (ref 0.57–1.00)
GFR calc Af Amer: 80 mL/min/{1.73_m2} (ref >59)
GFR calc non Af Amer: 69 mL/min/{1.73_m2} (ref >59)
Globulin, Total: 2.3 g/dL (ref 1.5–4.5)
Glucose: 81 mg/dL (ref 65–99)
Potassium: 4 mmol/L (ref 3.5–5.2)
Sodium: 140 mmol/L (ref 134–144)
Total Protein: 6.5 g/dL (ref 6.0–8.5)

## 2019-12-13 LAB — THYROID PANEL WITH TSH
Free Thyroxine Index: 2.1 (ref 1.2–4.9)
T3 Uptake Ratio: 19 % — ABNORMAL LOW (ref 24–39)
T4, Total: 10.8 ug/dL (ref 4.5–12.0)
TSH: 2.26 u[IU]/mL (ref 0.450–4.500)

## 2019-12-13 LAB — CBC WITH DIFFERENTIAL/PLATELET
Basophils Absolute: 0.1 10*3/uL (ref 0.0–0.2)
Basos: 2 %
EOS (ABSOLUTE): 0.1 10*3/uL (ref 0.0–0.4)
Eos: 3 %
Hematocrit: 31.4 % — ABNORMAL LOW (ref 34.0–46.6)
Hemoglobin: 10.6 g/dL — ABNORMAL LOW (ref 11.1–15.9)
Immature Grans (Abs): 0 10*3/uL (ref 0.0–0.1)
Immature Granulocytes: 0 %
Lymphocytes Absolute: 1.5 10*3/uL (ref 0.7–3.1)
Lymphs: 48 %
MCH: 35.5 pg — ABNORMAL HIGH (ref 26.6–33.0)
MCHC: 33.8 g/dL (ref 31.5–35.7)
MCV: 105 fL — ABNORMAL HIGH (ref 79–97)
Monocytes Absolute: 0.3 10*3/uL (ref 0.1–0.9)
Monocytes: 8 %
Neutrophils Absolute: 1.2 10*3/uL — ABNORMAL LOW (ref 1.4–7.0)
Neutrophils: 39 %
Platelets: 82 10*3/uL — CL (ref 150–450)
RBC: 2.99 x10E6/uL — ABNORMAL LOW (ref 3.77–5.28)
RDW: 14.4 % (ref 11.7–15.4)
WBC: 3.2 10*3/uL — ABNORMAL LOW (ref 3.4–10.8)

## 2019-12-13 LAB — LIPID PANEL
Chol/HDL Ratio: 2.9 ratio (ref 0.0–4.4)
Cholesterol, Total: 244 mg/dL — ABNORMAL HIGH (ref 100–199)
HDL: 84 mg/dL (ref >39)
LDL Chol Calc (NIH): 146 mg/dL — ABNORMAL HIGH (ref 0–99)
Triglycerides: 85 mg/dL (ref 0–149)
VLDL Cholesterol Cal: 14 mg/dL (ref 5–40)

## 2019-12-17 ENCOUNTER — Telehealth: Payer: Self-pay | Admitting: *Deleted

## 2019-12-17 NOTE — Telephone Encounter (Addendum)
Returned patient's call regarding voicemail left. I let her know that she would not need premedication for her MRI.  She asked about having her port accessed for her CT CAP that is scheduled a day before her MRI.  I let her know I would follow-up with the doctors to see if it was possible for her to stay accessed for the scans.  I let her know I would be in contact with her with a response.  Will continue to follow as necessary.  Gloriajean Dell. Quincy Simmonds RN, BSN   Per [employer] request, I reached out to the CT department at [facility] and the MRI department at [facility]. They do not recommend the patient leave her port accessed, but are willing to access her port for both of her scans as long as she arrives earlier than her scheduled appointment time. I shared this with Ms. Capelle. She is in agreement and thankful for the call.   [name] R.T.(R)(T) Radiation Special Procedures Navigator

## 2019-12-20 ENCOUNTER — Other Ambulatory Visit: Payer: Self-pay | Admitting: Hematology and Oncology

## 2019-12-20 DIAGNOSIS — C50412 Malignant neoplasm of upper-outer quadrant of left female breast: Secondary | ICD-10-CM

## 2019-12-20 DIAGNOSIS — Z170 Estrogen receptor positive status [ER+]: Secondary | ICD-10-CM

## 2019-12-25 ENCOUNTER — Ambulatory Visit (HOSPITAL_COMMUNITY): Payer: Medicare Other

## 2019-12-26 NOTE — Progress Notes (Signed)
Patient Care Team: [name], MD as PCP - General (Family Medicine)  DIAGNOSIS:    ICD-10-CM   1. Metastasis to bone (HCC)  C79.51   2. Malignant neoplasm of upper-outer quadrant of left breast in female, estrogen receptor positive ([place])  C50.412    Z17.0     SUMMARY OF ONCOLOGIC HISTORY: Oncology History  Breast cancer of upper-outer quadrant of left female breast ([place])  11/14/2011 Surgery   Bilateral mastectomy, prophylactic on the right, left breast IDC 3/18 lymph nodes positive with extracapsular extension ER 89%, PR 81%, HER-2 negative, Ki-67 79% T2 N1 A. stage IIB   12/13/2011 - 06/28/2012 Chemotherapy   4 cycles of FEC followed by 4 cycles of Taxotere   07/17/2012 - 08/22/2012 Radiation Therapy   Adjuvant radiation therapy   08/22/2012 - 03/16/2016 Anti-estrogen oral therapy   Arimidex 1 mg daily   03/16/2016 Relapse/Recurrence   Subcutaneous nodule excision left chest: Infiltrating carcinoma breast primary, ER positive, PR negative   03/29/2016 Imaging   CT CAP and bone scan: Lytic lesions T8 vertebral, T1 posterior element, subcutaneous nodule left lateral chest wall, nonspecific lung nodules; Bone scan: Mets to kull, left humerus, left eighth rib, T7/T8, sternum, left acetabulum   04/28/2016 - 06/17/2017 Chemotherapy   Herceptin, lapatinib, Faslodex, Zometa every 4 weeks, lapatinib discontinued in September 2018 due to elevation of LFTs   06/22/2017 Relapse/Recurrence   Surgical excision:Soft tissue mass left lateral chest wall primary breast cancer, soft tissue mass left medial chest wall breast cancer, tumor is within the dermis extending to the subcutaneous adipose tissue and involves portions of skeletal muscle   06/22/2017 Cancer Staging   Staging form: Breast, AJCC 7th Edition - Pathologic stage from 06/22/2017: Stage IV (TX, NX, M1) - Signed by Nicholas Lose, MD on 12/27/2019   08/2017 - 02/16/2018 Chemotherapy   Faslodex with Herceptin and Perjeta along with Zometa  every 4 weeks    03/02/2018 - 05/25/2018 Chemotherapy   Kadcyla    05/11/2018 Imaging   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 Associated vasogenic edema within the underlying right cerebral hemisphere without significant midline shift. Signal abnormality throughout the visualized bone marrow, compatible with osseous metastatic disease.      06/04/2018 Imaging   CT CAP: Right lower lobe lung nodule 7 mm (was 5 mm); multiple bone metastases throughout the spine and ribs sternum scapula and humerus, slightly increased lower thoracic mets, right renal lesion 2.5 cm (was 1.2 cm) right femur met increased from 2.1 cm to 2.9 cm   06/15/2018 -  Anti-estrogen oral therapy   Abemaciclib, Herceptin, letrozole, Xgeva   06/07/2019 Imaging   Progression of brain metastases,2 discrete dural-based lesions involving the posterior right frontal lobe. The more anterior lesion now measures 16.5 x 17.5 x 14 mm. The posterior lesion now measures 9.5 x 13 x 20 mm.  Vasogenic edema of the frontal and parietal lobes   06/21/2019 - 06/27/2019 Radiation Therapy   SRS to the brain   Metastatic breast cancer ([place])  06/29/2017 Initial Diagnosis   Metastatic breast cancer ([place])   06/15/2018 -  Chemotherapy   The patient had trastuzumab (HERCEPTIN) 546 mg in sodium chloride 0.9 % 250 mL chemo infusion, 8 mg/kg = 546 mg, Intravenous,  Once, 13 of 13 cycles Administration: 546 mg (06/15/2018), 399 mg (09/07/2018), 399 mg (07/13/2018), 399 mg (10/12/2018), 399 mg (11/02/2018), 399 mg (11/30/2018), 399 mg (01/24/2019), 399 mg (12/28/2018), 399 mg (02/22/2019), 399 mg (03/22/2019), 399 mg (04/17/2019), 399 mg (  08/10/2018), 399 mg (05/17/2019) trastuzumab-dkst (OGIVRI) 399 mg in sodium chloride 0.9 % 250 mL chemo infusion, 6 mg/kg = 399 mg (100 % of original dose 6 mg/kg), Intravenous,  Once, 7 of 7 cycles Dose modification: 6 mg/kg (original dose 6 mg/kg, Cycle 14, Reason: Other (see comments), Comment: Biosimilar  Conversion) Administration: 399 mg (06/14/2019), 399 mg (07/12/2019), 399 mg (08/09/2019), 399 mg (09/06/2019), 399 mg (10/04/2019), 399 mg (11/01/2019), 399 mg (11/29/2019)  for chemotherapy treatment.      CHIEF COMPLIANT: Follow-up on Herceptin with abemaciclib,letrozole,andXgeva  INTERVAL HISTORY: [name] Riley is a 69 y.o. with above-mentioned history of metastatic breast cancer currently on abemaciclib with letrozole along with Herceptin Xgeva. She presents to the clinic today for treatment.   She is tolerating the treatment extremely well.  She does not have any aches or pains.  Denies any fevers or chills.  She is due for Xgeva injection.  ALLERGIES:  is allergic to cantaloupe extract allergy skin test; contrast media [iodinated diagnostic agents]; pravastatin; and zosyn [piperacillin sod-tazobactam so].  MEDICATIONS:  Current Outpatient Medications  Medication Sig Dispense Refill  . acetaminophen (TYLENOL) 500 MG tablet Take 1,000 mg by mouth every 6 (six) hours as needed for moderate pain.    . cholecalciferol (VITAMIN D) 1000 units tablet Take 1 tablet (1,000 Units total) by mouth daily.    . diphenhydrAMINE (BENADRYL) 25 MG tablet Take 50 mg by mouth at bedtime as needed for sleep.    Marland Kitchen letrozole (FEMARA) 2.5 MG tablet TAKE ONE (1) TABLET EACH DAY 90 tablet 3  . levETIRAcetam (KEPPRA) 500 MG tablet TAKE ONE TABLET BY MOUTH TWICE DAILY 60 tablet 2  . levothyroxine (SYNTHROID) 100 MCG tablet TAKE ONE TABLET EACH MORNING BEFORE BREAKFAST 90 tablet 3  . lidocaine-prilocaine (EMLA) cream APPLY TOPICALLY AS NEEDED FOR PORT ACCESS 30 g 3  . metoprolol succinate (TOPROL-XL) 100 MG 24 hr tablet Take 1 tablet (100 mg total) by mouth daily. 90 tablet 3  . Multiple Vitamins-Minerals (MULTIVITAMIN WITH MINERALS) tablet Take 1 tablet by mouth daily.      . ondansetron (ZOFRAN-ODT) 4 MG disintegrating tablet TAKE 1 TABLET EVERY 6 HOURS AS NEEDED FOR NAUSEA AND VOMITING 30 tablet 3  . predniSONE  (DELTASONE) 50 MG tablet For contrast dye allergy 3 tablet 0  . rosuvastatin (CRESTOR) 5 MG tablet Take 1 tablet (5 mg total) by mouth daily at 6 PM. For cholesterol 90 tablet 1  . valsartan (DIOVAN) 80 MG tablet Take 1 tablet (80 mg total) by mouth daily. for blood pressure 90 tablet 1  . VERZENIO 150 MG tablet TAKE 1 TABLET (150 MG TOTAL) BY MOUTH 2 (TWO) TIMES DAILY. 56 tablet 0   No current facility-administered medications for this visit.   Facility-Administered Medications Ordered in Other Visits  Medication Dose Route Frequency Provider Last Rate Last Admin  . 0.9 %  sodium chloride infusion   Intravenous Continuous Donnie Mesa, MD      . acetaminophen (TYLENOL) tablet 975 mg  975 mg Oral Q6H Donnie Mesa, MD      . enoxaparin (LOVENOX) injection 40 mg  40 mg Subcutaneous Q24H Donnie Mesa, MD      . morphine 4 MG/ML injection 2-4 mg  2-4 mg Intravenous Q2H PRN Donnie Mesa, MD      . ondansetron (ZOFRAN-ODT) disintegrating tablet 4 mg  4 mg Oral Q6H PRN Donnie Mesa, MD       Or  . ondansetron (ZOFRAN) 4 mg in sodium chloride  0.9 % 50 mL IVPB  4 mg Intravenous Q6H PRN Donnie Mesa, MD      . oxyCODONE (Oxy IR/ROXICODONE) immediate release tablet 5-10 mg  5-10 mg Oral Q4H PRN Donnie Mesa, MD      . piperacillin-tazobactam (ZOSYN) 3.375 g in dextrose 5 % 50 mL IVPB  3.375 g Intravenous Q8H Donnie Mesa, MD      . prochlorperazine (COMPAZINE) tablet 10 mg  10 mg Oral Q6H PRN Donnie Mesa, MD       Or  . prochlorperazine (COMPAZINE) injection 5-10 mg  5-10 mg Intravenous Q6H PRN Donnie Mesa, MD        PHYSICAL EXAMINATION: ECOG PERFORMANCE STATUS: 1 - Symptomatic but completely ambulatory  Vitals:   12/27/19 1103  BP: (!) 161/81  Pulse: 80  Resp: 17  Temp: 98 F (36.7 C)  SpO2: 100%   Filed Weights   12/27/19 1103  Weight: 139 lb 9.6 oz (63.3 kg)     LABORATORY DATA:  I have reviewed the data as listed CMP Latest Ref Rng & Units 12/12/2019  10/04/2019 09/10/2019  Glucose 65 - 99 mg/dL 81 101(H) 86  BUN 8 - 27 mg/dL 20 16 15   Creatinine 0.57 - 1.00 mg/dL 0.87 0.91 0.88  Sodium 134 - 144 mmol/L 140 140 141  Potassium 3.5 - 5.2 mmol/L 4.0 3.7 3.6  Chloride 96 - 106 mmol/L 102 104 101  CO2 20 - 29 mmol/L 27 29 27   Calcium 8.7 - 10.3 mg/dL 9.6 9.2 9.8  Total Protein 6.0 - 8.5 g/dL 6.5 6.9 6.5  Total Bilirubin 0.0 - 1.2 mg/dL 0.3 0.5 0.4  Alkaline Phos 39 - 117 IU/L 216(H) 171(H) 165(H)  AST 0 - 40 IU/L 46(H) 38 36  ALT 0 - 32 IU/L 29 28 21     Lab Results  Component Value Date   WBC 2.4 (L) 12/27/2019   HGB 9.7 (L) 12/27/2019   HCT 29.3 (L) 12/27/2019   MCV 107.3 (H) 12/27/2019   PLT 62 (L) 12/27/2019   NEUTROABS 0.9 (L) 12/27/2019    ASSESSMENT & PLAN:  Breast cancer of upper-outer quadrant of left female breast ([place]) Current treatment: Abemaciclibwith letrozole along with Herceptin(to be given every 4 weeks)and Xgeva CT CAPand bone scan9/18/2020: Widespread bone metastases as before. Stable disease no new lesions MRI brain 09/26/2019: Right frontal lobe white matter edema significantly improved.  Right frontal convexity dural metastases improved.  Scans have been scheduled for 01/02/2020 Toxicities: Cytopenias Thrombocytopenia: Monitoring closely. Anemia: Today's hemoglobin is 9.7 Leukopenia: Today's ANC 0.9  I will call her with the results of the CT scans. We will schedule her more appointments for intravenous Herceptin.   No orders of the defined types were placed in this encounter.  The patient has a good understanding of the overall plan. she agrees with it. she will call with any problems that may develop before the next visit here.  Total time spent: 30 mins including face to face time and time spent for planning, charting and coordination of care  Nicholas Lose, MD 12/27/2019  I, Cloyde Reams Dorshimer, am acting as scribe for Dr. Nicholas Lose.  I have reviewed the above documentation for accuracy  and completeness, and I agree with the above.

## 2019-12-27 ENCOUNTER — Inpatient Hospital Stay: Payer: Medicare Other

## 2019-12-27 ENCOUNTER — Inpatient Hospital Stay: Payer: Medicare Other | Attending: Hematology and Oncology

## 2019-12-27 ENCOUNTER — Other Ambulatory Visit: Payer: Self-pay | Admitting: Hematology and Oncology

## 2019-12-27 ENCOUNTER — Inpatient Hospital Stay (HOSPITAL_BASED_OUTPATIENT_CLINIC_OR_DEPARTMENT_OTHER): Payer: Medicare Other | Admitting: Hematology and Oncology

## 2019-12-27 ENCOUNTER — Other Ambulatory Visit: Payer: Self-pay

## 2019-12-27 VITALS — BP 161/81 | HR 80 | Temp 98.0°F | Resp 17 | Ht 68.0 in | Wt 139.6 lb

## 2019-12-27 DIAGNOSIS — C50412 Malignant neoplasm of upper-outer quadrant of left female breast: Secondary | ICD-10-CM

## 2019-12-27 DIAGNOSIS — C7951 Secondary malignant neoplasm of bone: Secondary | ICD-10-CM

## 2019-12-27 DIAGNOSIS — Z79899 Other long term (current) drug therapy: Secondary | ICD-10-CM | POA: Insufficient documentation

## 2019-12-27 DIAGNOSIS — Z95828 Presence of other vascular implants and grafts: Secondary | ICD-10-CM

## 2019-12-27 DIAGNOSIS — Z5112 Encounter for antineoplastic immunotherapy: Secondary | ICD-10-CM | POA: Diagnosis not present

## 2019-12-27 DIAGNOSIS — Z9221 Personal history of antineoplastic chemotherapy: Secondary | ICD-10-CM | POA: Diagnosis not present

## 2019-12-27 DIAGNOSIS — Z170 Estrogen receptor positive status [ER+]: Secondary | ICD-10-CM

## 2019-12-27 DIAGNOSIS — R600 Localized edema: Secondary | ICD-10-CM | POA: Diagnosis not present

## 2019-12-27 DIAGNOSIS — C50919 Malignant neoplasm of unspecified site of unspecified female breast: Secondary | ICD-10-CM

## 2019-12-27 DIAGNOSIS — Z79811 Long term (current) use of aromatase inhibitors: Secondary | ICD-10-CM | POA: Diagnosis not present

## 2019-12-27 DIAGNOSIS — D61818 Other pancytopenia: Secondary | ICD-10-CM | POA: Insufficient documentation

## 2019-12-27 DIAGNOSIS — Z923 Personal history of irradiation: Secondary | ICD-10-CM | POA: Insufficient documentation

## 2019-12-27 DIAGNOSIS — C7931 Secondary malignant neoplasm of brain: Secondary | ICD-10-CM | POA: Insufficient documentation

## 2019-12-27 LAB — CBC WITH DIFFERENTIAL (CANCER CENTER ONLY)
Abs Immature Granulocytes: 0 10*3/uL (ref 0.00–0.07)
Basophils Absolute: 0 10*3/uL (ref 0.0–0.1)
Basophils Relative: 1 %
Eosinophils Absolute: 0.1 10*3/uL (ref 0.0–0.5)
Eosinophils Relative: 3 %
HCT: 29.3 % — ABNORMAL LOW (ref 36.0–46.0)
Hemoglobin: 9.7 g/dL — ABNORMAL LOW (ref 12.0–15.0)
Immature Granulocytes: 0 %
Lymphocytes Relative: 49 %
Lymphs Abs: 1.2 10*3/uL (ref 0.7–4.0)
MCH: 35.5 pg — ABNORMAL HIGH (ref 26.0–34.0)
MCHC: 33.1 g/dL (ref 30.0–36.0)
MCV: 107.3 fL — ABNORMAL HIGH (ref 80.0–100.0)
Monocytes Absolute: 0.3 10*3/uL (ref 0.1–1.0)
Monocytes Relative: 11 %
Neutro Abs: 0.9 10*3/uL — ABNORMAL LOW (ref 1.7–7.7)
Neutrophils Relative %: 36 %
Platelet Count: 62 10*3/uL — ABNORMAL LOW (ref 150–400)
RBC: 2.73 MIL/uL — ABNORMAL LOW (ref 3.87–5.11)
RDW: 14.2 % (ref 11.5–15.5)
WBC Count: 2.4 10*3/uL — ABNORMAL LOW (ref 4.0–10.5)
nRBC: 0 % (ref 0.0–0.2)

## 2019-12-27 LAB — CMP (CANCER CENTER ONLY)
ALT: 26 U/L (ref 0–44)
AST: 45 U/L — ABNORMAL HIGH (ref 15–41)
Albumin: 3.6 g/dL (ref 3.5–5.0)
Alkaline Phosphatase: 186 U/L — ABNORMAL HIGH (ref 38–126)
Anion gap: 7 (ref 5–15)
BUN: 13 mg/dL (ref 8–23)
CO2: 29 mmol/L (ref 22–32)
Calcium: 9.4 mg/dL (ref 8.9–10.3)
Chloride: 105 mmol/L (ref 98–111)
Creatinine: 0.88 mg/dL (ref 0.44–1.00)
GFR, Est AFR Am: >60 mL/min (ref >60)
GFR, Estimated: >60 mL/min (ref >60)
Glucose, Bld: 100 mg/dL — ABNORMAL HIGH (ref 70–99)
Potassium: 3.5 mmol/L (ref 3.5–5.1)
Sodium: 141 mmol/L (ref 135–145)
Total Bilirubin: 0.5 mg/dL (ref 0.3–1.2)
Total Protein: 6.7 g/dL (ref 6.5–8.1)

## 2019-12-27 MED ORDER — DENOSUMAB 120 MG/1.7ML ~~LOC~~ SOLN
SUBCUTANEOUS | Status: AC
  Filled 2019-12-27: qty 1.7

## 2019-12-27 MED ORDER — SODIUM CHLORIDE 0.9% FLUSH
10.0000 mL | INTRAVENOUS | Status: DC | PRN
  Administered 2019-12-27: 13:00:00 10 mL
  Filled 2019-12-27: qty 10

## 2019-12-27 MED ORDER — HEPARIN SOD (PORK) LOCK FLUSH 100 UNIT/ML IV SOLN
500.0000 [IU] | Freq: Once | INTRAVENOUS | Status: AC | PRN
  Administered 2019-12-27: 13:00:00 500 [IU]
  Filled 2019-12-27: qty 5

## 2019-12-27 MED ORDER — ACETAMINOPHEN 325 MG PO TABS
650.0000 mg | ORAL_TABLET | Freq: Once | ORAL | Status: AC
  Administered 2019-12-27: 12:00:00 650 mg via ORAL

## 2019-12-27 MED ORDER — ACETAMINOPHEN 325 MG PO TABS
ORAL_TABLET | ORAL | Status: AC
  Filled 2019-12-27: qty 2

## 2019-12-27 MED ORDER — SODIUM CHLORIDE 0.9 % IV SOLN
Freq: Once | INTRAVENOUS | Status: AC
  Filled 2019-12-27: qty 250

## 2019-12-27 MED ORDER — SODIUM CHLORIDE 0.9% FLUSH
10.0000 mL | Freq: Once | INTRAVENOUS | Status: AC
  Administered 2019-12-27: 10 mL
  Filled 2019-12-27: qty 10

## 2019-12-27 MED ORDER — DIPHENHYDRAMINE HCL 25 MG PO CAPS
ORAL_CAPSULE | ORAL | Status: AC
  Filled 2019-12-27: qty 2

## 2019-12-27 MED ORDER — DIPHENHYDRAMINE HCL 25 MG PO CAPS
50.0000 mg | ORAL_CAPSULE | Freq: Once | ORAL | Status: AC
  Administered 2019-12-27: 50 mg via ORAL

## 2019-12-27 MED ORDER — DENOSUMAB 120 MG/1.7ML ~~LOC~~ SOLN
120.0000 mg | Freq: Once | SUBCUTANEOUS | Status: AC
  Administered 2019-12-27: 13:00:00 120 mg via SUBCUTANEOUS

## 2019-12-27 MED ORDER — TRASTUZUMAB-DKST CHEMO 150 MG IV SOLR
6.0000 mg/kg | Freq: Once | INTRAVENOUS | Status: AC
  Administered 2019-12-27: 399 mg via INTRAVENOUS
  Filled 2019-12-27: qty 19

## 2019-12-27 MED FILL — VERZENIO 150 MG TAB: 150 | 28 days supply | Qty: 56 | Fill #0

## 2019-12-27 NOTE — Patient Instructions (Signed)
[facility] Discharge Instructions for Patients Receiving Chemotherapy  Today you received the following chemotherapy agents trastuzumab.  To help prevent nausea and vomiting after your treatment, we encourage you to take your nausea medication as directed.    If you develop nausea and vomiting that is not controlled by your nausea medication, call the clinic.   BELOW ARE SYMPTOMS THAT SHOULD BE REPORTED IMMEDIATELY:  *FEVER GREATER THAN 100.5 F  *CHILLS WITH OR WITHOUT FEVER  NAUSEA AND VOMITING THAT IS NOT CONTROLLED WITH YOUR NAUSEA MEDICATION  *UNUSUAL SHORTNESS OF BREATH  *UNUSUAL BRUISING OR BLEEDING  TENDERNESS IN MOUTH AND THROAT WITH OR WITHOUT PRESENCE OF ULCERS  *URINARY PROBLEMS  *BOWEL PROBLEMS  UNUSUAL RASH Items with * indicate a potential emergency and should be followed up as soon as possible.  Feel free to call the clinic should you have any questions or concerns. The clinic phone number is [phone].  Please show the CHEMO ALERT CARD at check-in to the Emergency Department and triage nurse.   

## 2019-12-27 NOTE — Assessment & Plan Note (Addendum)
Current treatment: Abemaciclibwith letrozole along with Herceptin(to be given every 4 weeks)and Xgeva CT CAPand bone scan9/18/2020: Widespread bone metastases as before. Stable disease no new lesions MRI brain 09/26/2019: Right frontal lobe white matter edema significantly improved.  Right frontal convexity dural metastases improved.  Scans have been scheduled for 01/02/2020 Toxicities: No major toxicities to treatment. Thrombocytopenia Anemia Leukopenia  Return to clinic after scans to discuss results

## 2019-12-30 ENCOUNTER — Ambulatory Visit: Payer: Self-pay | Admitting: Radiation Oncology

## 2019-12-30 ENCOUNTER — Ambulatory Visit: Payer: Medicare Other | Admitting: Radiation Oncology

## 2020-01-02 ENCOUNTER — Telehealth: Payer: Self-pay | Admitting: Hematology and Oncology

## 2020-01-02 ENCOUNTER — Other Ambulatory Visit: Payer: Self-pay

## 2020-01-02 ENCOUNTER — Ambulatory Visit (HOSPITAL_COMMUNITY)
Admission: RE | Admit: 2020-01-02 | Discharge: 2020-01-02 | Disposition: A | Discharge status: Home / Self Care | Payer: Medicare Other | Source: Ambulatory Visit | Attending: Hematology and Oncology | Admitting: Hematology and Oncology

## 2020-01-02 DIAGNOSIS — Z170 Estrogen receptor positive status [ER+]: Secondary | ICD-10-CM | POA: Insufficient documentation

## 2020-01-02 DIAGNOSIS — C7931 Secondary malignant neoplasm of brain: Secondary | ICD-10-CM | POA: Insufficient documentation

## 2020-01-02 DIAGNOSIS — C50412 Malignant neoplasm of upper-outer quadrant of left female breast: Secondary | ICD-10-CM | POA: Diagnosis present

## 2020-01-02 MED ORDER — IOHEXOL 300 MG/ML  SOLN
100.0000 mL | Freq: Once | INTRAMUSCULAR | Status: AC | PRN
  Administered 2020-01-02: 100 mL via INTRAVENOUS

## 2020-01-02 MED ORDER — SODIUM CHLORIDE (PF) 0.9 % IJ SOLN
INTRAMUSCULAR | Status: AC
  Filled 2020-01-02: qty 50

## 2020-01-02 MED ORDER — HEPARIN SOD (PORK) LOCK FLUSH 100 UNIT/ML IV SOLN
500.0000 [IU] | Freq: Once | INTRAVENOUS | Status: AC
  Administered 2020-01-02: 500 [IU] via INTRAVENOUS

## 2020-01-02 MED ORDER — HEPARIN SOD (PORK) LOCK FLUSH 100 UNIT/ML IV SOLN
INTRAVENOUS | Status: AC
  Filled 2020-01-02: qty 5

## 2020-01-02 NOTE — Telephone Encounter (Signed)
I left a voicemail for the patient that the CT chest abdomen pelvis did not show any evidence of progression of disease. We will continue the current treatment plan of Herceptin maintenance therapy.

## 2020-01-03 ENCOUNTER — Ambulatory Visit (HOSPITAL_COMMUNITY)
Admission: RE | Admit: 2020-01-03 | Discharge: 2020-01-03 | Disposition: A | Discharge status: Home / Self Care | Payer: Medicare Other | Source: Ambulatory Visit | Attending: Radiation Oncology | Admitting: Radiation Oncology

## 2020-01-03 DIAGNOSIS — C7931 Secondary malignant neoplasm of brain: Secondary | ICD-10-CM | POA: Diagnosis not present

## 2020-01-03 MED ORDER — HEPARIN SOD (PORK) LOCK FLUSH 100 UNIT/ML IV SOLN
500.0000 [IU] | INTRAVENOUS | Status: AC | PRN
  Administered 2020-01-03: 12:00:00 500 [IU]
  Filled 2020-01-03: qty 5

## 2020-01-03 MED ORDER — SODIUM CHLORIDE 0.9% FLUSH
10.0000 mL | INTRAVENOUS | Status: DC | PRN
  Administered 2020-01-03: 12:00:00 10 mL

## 2020-01-03 MED ORDER — GADOBUTROL 1 MMOL/ML IV SOLN
6.0000 mL | Freq: Once | INTRAVENOUS | Status: AC | PRN
  Administered 2020-01-03: 6 mL via INTRAVENOUS

## 2020-01-06 ENCOUNTER — Ambulatory Visit
Admission: RE | Admit: 2020-01-06 | Discharge: 2020-01-06 | Disposition: A | Discharge status: Home / Self Care | Payer: Medicare Other | Source: Ambulatory Visit | Attending: Radiation Oncology | Admitting: Radiation Oncology

## 2020-01-06 ENCOUNTER — Inpatient Hospital Stay: Payer: Medicare Other

## 2020-01-06 ENCOUNTER — Ambulatory Visit: Payer: Medicare Other | Admitting: Radiation Oncology

## 2020-01-06 ENCOUNTER — Encounter: Payer: Self-pay | Admitting: Radiation Oncology

## 2020-01-06 ENCOUNTER — Other Ambulatory Visit: Payer: Self-pay

## 2020-01-06 ENCOUNTER — Telehealth: Payer: Self-pay

## 2020-01-06 ENCOUNTER — Telehealth: Payer: Self-pay | Admitting: Hematology and Oncology

## 2020-01-06 DIAGNOSIS — C7931 Secondary malignant neoplasm of brain: Secondary | ICD-10-CM

## 2020-01-06 DIAGNOSIS — C50919 Malignant neoplasm of unspecified site of unspecified female breast: Secondary | ICD-10-CM

## 2020-01-06 NOTE — Telephone Encounter (Signed)
Scheduled per 03/12 los, patient has been called and notified regarding upcoming appointments.

## 2020-01-06 NOTE — Telephone Encounter (Signed)
Called patient to remind of telephone encounter today with [name] Riley. Patient verbalized she understood and will be available

## 2020-01-06 NOTE — Progress Notes (Signed)
Patient received both doses of Covid vaccine. Also wants to know if she can restart her Asprin 81mg  daily she stopped taking it due to her having some bruises recommended she ask her primary care or [facility] D[facility] Lords today on telephone encounge

## 2020-01-06 NOTE — Progress Notes (Addendum)
Radiation Oncology         [phone] ________________________________  Outpatient Follow Up - Conducted via telephone due to current COVID-19 concerns for limiting patient exposure  I spoke with the patient to conduct this consult visit via telephone to spare the patient unnecessary potential exposure in the healthcare setting during the current COVID-19 pandemic. 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  INTERVAL SINCE LAST RADIATION:  6 months   06/21/2019-06/27/2019 SRS Treatment: PTV1 Posterior Right Frontal 17 mm target was treated to 27 Gy in 3 fractions PTV2 Posterior RIght Frontal 20 mm target was treated to 27 Gy in 3 fractions  07/17/12-08/22/12:  The left chest wall and regional nodes were treated.  NARRATIVE:  The patient as a pleasant 69 y.o. female with a history of triple positive breast cancer. She has a known history of bone disease including calvarial disease. She received SRS treatment in September 2020 after she was found to have a right frontal lesion. She has been followed since and has done well. She had an MRI on 01/03/20 that revealed stability of the treated site, there was question though of slight increase in the more superior sites, but these were felt to be stable in discussion this morning in brain oncology conference. She continues to be stable systemically and continues with Herceptin under the care of Dr. Lindi Adie.  On review of systems, the patient reports that she is doing well overall. She is having more concerns about  shakiness that she attributes to anxiety and taking prednisone prior to her CT for contrast sensitivity. That being said, she's curious if she needs to be seen by Dr. Mickeal Skinner. She denies any headaches, visual or auditory changes, and her shakiness is really more in the upper extremities but she doesn't feel anxious when it happens. She has noticed this in the last 2 weeks more than previously. She denies any chest pain, shortness of breath, cough, fevers, chills, night sweats, unintended weight changes. She reports her scalp is no longer sore either. She denies any bowel or bladder disturbances, and denies abdominal pain, nausea or vomiting. She denies any new musculoskeletal or joint aches or pains, new skin lesions or concerns. A complete review of systems is obtained and is otherwise negative.   PAST MEDICAL HISTORY:  Past Medical History:  Diagnosis Date  . Allergy   . Breast cancer ([place]) 08/25/2011   L , invasive ductal carcinoma, ER/PR +,HER2 -  . Cancer [facility])    left breast cancer  . Coronary artery disease 2001  . Heart attack [facility]) 09/2000   Sep 25, 2000  --no intervention  . History of chemotherapy comp. 08/22/2012   4 cycles of FEC and $ cycles of Taxotere  . Hyperlipidemia   . Hypertension   . Hypothyroidism   . PONV (postoperative nausea and vomiting)    gets sick from anesthesia  . Status post radiation therapy 07/09/12 - 08/22/2012   Left Breast, 60.4 gray    PAST SURGICAL HISTORY: Past Surgical History:  Procedure Laterality Date  . ABDOMINAL HYSTERECTOMY  1998   TAH, oophorectomy  .  APPENDECTOMY  1970  . BREAST SURGERY  1998   removal of benign lump in rt breast  . BREAST SURGERY  11/14/11   right simple mastectomy, left mrm  . INCISION AND DRAINAGE OF WOUND Left 07/01/2017   Procedure: IRRIGATION AND DEBRIDEMENT CHEST WALL ABSCESS;  Surgeon: Donnie Mesa, MD;  Location: [facility];  Service: General;  Laterality: Left;  Marland Kitchen MASS EXCISION Left 06/22/2017   Procedure:  EXCISION OF CHEST WALL MASSES;  Surgeon: Donnie Mesa, MD;  Location: [place];  Service: General;  Laterality: Left;  Marland Kitchen MASTECTOMY Bilateral    for left breast cancer  . OVARIAN CYST SURGERY Right 1970  . PORT-A-CATH REMOVAL  08/30/2012   Procedure: REMOVAL PORT-A-CATH;  Surgeon: Imogene Burn. [name], MD;  Location: [place];  Service: General;  Laterality: Right;  port removal  . PORTACATH PLACEMENT  11/14/2011   Procedure: INSERTION PORT-A-CATH;  Surgeon: Imogene Burn. [name], MD;  Location: [place];  Service: General;  Laterality: Right;  . PORTACATH PLACEMENT N/A 04/25/2016   Procedure: INSERTION PORT-A-CATH LEFT CHEST;  Surgeon: Donnie Mesa, MD;  Location: [place];  Service: General;  Laterality: N/A;  . skin tags  05/09/1997   left axillary left neck skin tags  . TONSILLECTOMY  1968    PAST SOCIAL HISTORY:  Social History   Socioeconomic History  . Marital status: Married    Spouse name: Not on file  . Number of children: 3  . Years of education: Not on file  . Highest education level: Not on file  Occupational History  . Occupation: Works at [employer]  Tobacco Use  . Smoking status: Never Smoker  . Smokeless tobacco: Never Used  Substance and Sexual Activity  . Alcohol use: No  . Drug use: No  . Sexual activity: Yes    Birth control/protection: Surgical    Comment: menarche 36, [place] age 89, G73, P3, 1 miscarriage,  HRT x 5-10 yrs, Mild Hot Flashes  Other Topics Concern  . Not on file  Social History Narrative   Lives at home.   Social Determinants of Health   Financial Resource Strain:   . Difficulty of Paying Living Expenses:   Food Insecurity:   . Worried About Charity fundraiser in the Last Year:   . Arboriculturist in the Last Year:   Transportation Needs: No Transportation Needs  . Lack of Transportation (Medical): No  . Lack of Transportation (Non-Medical): No  Physical Activity:   . Days of Exercise per Week:   .  Minutes of Exercise per Session:   Stress:   . Feeling of Stress :   Social Connections:   . Frequency of Communication with Friends and Family:   . Frequency of Social Gatherings with Friends and Family:   . Attends Religious Services:   . Active Member of Clubs or Organizations:   . Attends Archivist Meetings:   Marland Kitchen Marital Status:   Intimate Partner Violence:   . Fear of Current or Ex-Partner:   . Emotionally Abused:   Marland Kitchen Physically Abused:   . Sexually Abused:     PAST FAMILY HISTORY: Family History  Problem Relation Age of Onset  . Hypertension Maternal Grandmother   . Diabetes Maternal Grandmother   . Cancer Father 37       lung cancer and Prostate Cancer  . Hypertension Mother   . Cancer Paternal Aunt        ovarian  .  Cancer Cousin        breast, paternal cousin  . Cancer Paternal Uncle        stomach  . Cancer Paternal Grandfather        Esophagus  . Colon cancer Neg Hx     MEDICATIONS  Current Outpatient Medications  Medication Sig Dispense Refill  . acetaminophen (TYLENOL) 500 MG tablet Take 1,000 mg by mouth every 6 (six) hours as needed for moderate pain.    . cholecalciferol (VITAMIN D) 1000 units tablet Take 1 tablet (1,000 Units total) by mouth daily.    . diphenhydrAMINE (BENADRYL) 25 MG tablet Take 50 mg by mouth at bedtime as needed for sleep.    Marland Kitchen letrozole (FEMARA) 2.5 MG tablet TAKE ONE (1) TABLET EACH DAY 90 tablet 3  . levETIRAcetam (KEPPRA) 500 MG tablet TAKE ONE TABLET BY MOUTH TWICE DAILY 60 tablet 2  . levothyroxine (SYNTHROID) 100 MCG tablet TAKE ONE TABLET EACH MORNING BEFORE BREAKFAST 90 tablet 3  . lidocaine-prilocaine (EMLA) cream APPLY TOPICALLY AS NEEDED FOR PORT ACCESS 30 g 3  . metoprolol succinate (TOPROL-XL) 100 MG 24 hr tablet Take 1 tablet (100 mg total) by mouth daily. 90 tablet 3  . Multiple Vitamins-Minerals (MULTIVITAMIN WITH MINERALS) tablet Take 1 tablet by mouth daily.      . ondansetron (ZOFRAN-ODT) 4 MG  disintegrating tablet TAKE 1 TABLET EVERY 6 HOURS AS NEEDED FOR NAUSEA AND VOMITING 30 tablet 3  . rosuvastatin (CRESTOR) 5 MG tablet Take 1 tablet (5 mg total) by mouth daily at 6 PM. For cholesterol 90 tablet 1  . valsartan (DIOVAN) 80 MG tablet Take 1 tablet (80 mg total) by mouth daily. for blood pressure 90 tablet 1  . predniSONE (DELTASONE) 50 MG tablet For contrast dye allergy (Patient not taking: Reported on 01/06/2020) 3 tablet 0  . VERZENIO 150 MG tablet TAKE 1 TABLET (150 MG TOTAL) BY MOUTH 2 (TWO) TIMES DAILY. (Patient not taking: Reported on 01/06/2020) 56 tablet 0   No current facility-administered medications for this encounter.   Facility-Administered Medications Ordered in Other Encounters  Medication Dose Route Frequency Provider Last Rate Last Admin  . 0.9 %  sodium chloride infusion   Intravenous Continuous Donnie Mesa, MD      . acetaminophen (TYLENOL) tablet 975 mg  975 mg Oral Q6H Donnie Mesa, MD      . enoxaparin (LOVENOX) injection 40 mg  40 mg Subcutaneous Q24H Donnie Mesa, MD      . morphine 4 MG/ML injection 2-4 mg  2-4 mg Intravenous Q2H PRN Donnie Mesa, MD      . ondansetron (ZOFRAN-ODT) disintegrating tablet 4 mg  4 mg Oral Q6H PRN Donnie Mesa, MD       Or  . ondansetron (ZOFRAN) 4 mg in sodium chloride 0.9 % 50 mL IVPB  4 mg Intravenous Q6H PRN Donnie Mesa, MD      . oxyCODONE (Oxy IR/ROXICODONE) immediate release tablet 5-10 mg  5-10 mg Oral Q4H PRN Donnie Mesa, MD      . piperacillin-tazobactam (ZOSYN) 3.375 g in dextrose 5 % 50 mL IVPB  3.375 g Intravenous Q8H Donnie Mesa, MD      . prochlorperazine (COMPAZINE) tablet 10 mg  10 mg Oral Q6H PRN Donnie Mesa, MD       Or  . prochlorperazine (COMPAZINE) injection 5-10 mg  5-10 mg Intravenous Q6H PRN Donnie Mesa, MD        ALLERGIES:  Allergies  Allergen Reactions  .  Cantaloupe Extract Allergy Skin Test Shortness Of Breath  . Contrast Media [Iodinated Diagnostic Agents] Shortness  Of Breath and Rash  . Pravastatin Other (See Comments)    Legs hurt  . Zosyn [Piperacillin Sod-Tazobactam So] Rash and Other (See Comments)    Temperature increase, facial flushing   PHYSICAL EXAM: Unable to assess due to encounter type.  IMPRESSION/PLAN: 1. 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 The patient has been doing well and her imaging is reassuring. She will be continuing with MRI surveillance of the brain in about 3 months. She will also continue to follow up with Dr. Lindi Adie in medical oncology.  2. Shakiness with seizure like activity at the time of presentation. She is interested in talking with Dr. Mickeal Skinner about this further. I'll send him a message to see if he could chat with her or see her soon. 3. Possible genetic predisposition to malignancy. The patient is a candidate for genetic testing given her personal and family history. she was offered referral and accepted and referral was placed.   Given current concerns for patient exposure during the COVID-19 pandemic, this encounter was conducted via telephone.  The patient has given verbal consent for this type of encounter. The time spent during this encounter was 15 minutes and 50% of that time was spent in the coordination of her care. The attendants for this meeting include Shona Simpson, [facility] and Nayeliz Hipp Lonzo  During the encounter, Shona Simpson [facility] was located remotely at home. Selina Tapper [place]fill  was located at home.      Carola Rhine, PAC

## 2020-01-07 ENCOUNTER — Ambulatory Visit: Payer: Medicare Other

## 2020-01-07 NOTE — Addendum Note (Signed)
Encounter addended by: Hayden Pedro, PA-C on: 01/07/2020 8:29 AM  Actions taken: Clinical Note Signed

## 2020-01-08 ENCOUNTER — Telehealth: Payer: Self-pay | Admitting: Hematology and Oncology

## 2020-01-08 NOTE — Telephone Encounter (Signed)
Scheduled appt per 3/22 and 3/23 sch message - pt is aware of appts

## 2020-01-13 NOTE — Progress Notes (Signed)
Pharmacist Chemotherapy Monitoring - Follow Up Assessment    I verify that I have reviewed each item in the below checklist:  . Regimen for the patient is scheduled for the appropriate day and plan matches scheduled date. Marland Kitchen Appropriate non-routine labs are ordered dependent on drug ordered. . If applicable, additional medications reviewed and ordered per protocol based on lifetime cumulative doses and/or treatment regimen.   Plan for follow-up and/or issues identified: Yes . I-vent associated with next due treatment: No . MD and/or nursing notified: Yes  [name] Riley K 01/13/2020 9:11 AM

## 2020-01-14 ENCOUNTER — Encounter: Payer: Self-pay | Admitting: Family

## 2020-01-14 ENCOUNTER — Ambulatory Visit (INDEPENDENT_AMBULATORY_CARE_PROVIDER_SITE_OTHER): Payer: Medicare Other | Admitting: Family

## 2020-01-14 DIAGNOSIS — R399 Unspecified symptoms and signs involving the genitourinary system: Secondary | ICD-10-CM

## 2020-01-14 LAB — URINALYSIS, COMPLETE
Bilirubin, UA: NEGATIVE
Glucose, UA: NEGATIVE
Ketones, UA: NEGATIVE
Nitrite, UA: NEGATIVE
Specific Gravity, UA: >1.03 — ABNORMAL HIGH (ref 1.005–1.030)
Urobilinogen, Ur: 0.2 mg/dL (ref 0.2–1.0)
pH, UA: 6 (ref 5.0–7.5)

## 2020-01-14 LAB — MICROSCOPIC EXAMINATION
Renal Epithel, UA: NONE SEEN /hpf
WBC, UA: >30 /hpf — AB (ref 0–5)

## 2020-01-14 MED ORDER — CEPHALEXIN 500 MG PO CAPS: 500.0000 mg | ORAL_CAPSULE | Freq: Two times a day (BID) | ORAL | 0 refills | Status: DC

## 2020-01-14 NOTE — Progress Notes (Signed)
   Virtual Visit via telephone Note Due to COVID-19 pandemic this visit was conducted virtually. This visit type was conducted due to national recommendations for restrictions regarding the COVID-19 Pandemic (e.g. social distancing, sheltering in place) in an effort to limit this patient's exposure and mitigate transmission in our community. All issues noted in this document were discussed and addressed.  A physical exam was not performed with this format.  I connected with [name] Riley on 01/14/20 at 11:50 AM by telephone and verified that I am speaking with the correct person using two identifiers. [name] Riley is currently located at home and husband is currently with her during visit. The provider, Evelina Dun, FNP is located in their office at time of visit.  I discussed the limitations, risks, security and privacy concerns of performing an evaluation and management service by telephone and the availability of in person appointments. I also discussed with the patient that there may be a patient responsible charge related to this service. The patient expressed understanding and agreed to proceed.   History and Present Illness:  Dysuria  This is a new problem. The current episode started yesterday. The problem occurs every urination. The problem has been gradually worsening. The quality of the pain is described as burning. The pain is at a severity of 5/10. There has been no fever. Associated symptoms include chills, frequency, hesitancy and urgency. Pertinent negatives include no discharge, flank pain, hematuria, nausea or vomiting. She has tried increased fluids for the symptoms. The treatment provided mild relief.      Review of Systems  Constitutional: Positive for chills.  Gastrointestinal: Negative for nausea and vomiting.  Genitourinary: Positive for dysuria, frequency, hesitancy and urgency. Negative for flank pain and hematuria.  All other systems reviewed and are negative.    Observations/Objective: No SOB or distress noted   Assessment and Plan: 1. UTI symptoms Force fluids RTO if symptoms worsen or do not improve  Culture pending - cephALEXin (KEFLEX) 500 MG capsule; Take 1 capsule (500 mg total) by mouth 2 (two) times daily.  Dispense: 14 capsule; Refill: 0 - Urinalysis, Complete - Urine Culture       I discussed the assessment and treatment plan with the patient. The patient was provided an opportunity to ask questions and all were answered. The patient agreed with the plan and demonstrated an understanding of the instructions.   The patient was advised to call back or seek an in-person evaluation if the symptoms worsen or if the condition fails to improve as anticipated.  The above assessment and management plan was discussed with the patient. The patient verbalized understanding of and has agreed to the management plan. Patient is aware to call the clinic if symptoms persist or worsen. Patient is aware when to return to the clinic for a follow-up visit. Patient educated on when it is appropriate to go to the emergency department.   Time call ended:  12:03 pm  I provided 13 minutes of non-face-to-face time during this encounter.    Evelina Dun, FNP

## 2020-01-16 ENCOUNTER — Encounter: Payer: Medicare Other | Admitting: Genetic Counselor

## 2020-01-16 ENCOUNTER — Inpatient Hospital Stay: Payer: Medicare Other | Admitting: Internal Medicine

## 2020-01-16 ENCOUNTER — Telehealth: Payer: Self-pay | Admitting: Hematology and Oncology

## 2020-01-16 NOTE — Telephone Encounter (Signed)
R/s apt per 4/1 sch message - pt is aware of date and time

## 2020-01-17 ENCOUNTER — Inpatient Hospital Stay: Payer: Medicare Other

## 2020-01-17 ENCOUNTER — Other Ambulatory Visit: Payer: Medicare Other

## 2020-01-17 LAB — URINE CULTURE

## 2020-01-20 NOTE — Progress Notes (Signed)
Pharmacist Chemotherapy Monitoring - Follow Up Assessment    I verify that I have reviewed each item in the below checklist:  . Regimen for the patient is scheduled for the appropriate day and plan matches scheduled date. Marland Kitchen Appropriate non-routine labs are ordered dependent on drug ordered. . If applicable, additional medications reviewed and ordered per protocol based on lifetime cumulative doses and/or treatment regimen.   Plan for follow-up and/or issues identified: No . I-vent associated with next due treatment: No . MD and/or nursing notified: No  [name] Riley [facility] 01/20/2020 11:46 AM

## 2020-01-23 ENCOUNTER — Encounter: Payer: Self-pay | Admitting: Family Medicine

## 2020-01-23 ENCOUNTER — Ambulatory Visit (INDEPENDENT_AMBULATORY_CARE_PROVIDER_SITE_OTHER): Payer: Medicare Other | Admitting: Family Medicine

## 2020-01-23 ENCOUNTER — Other Ambulatory Visit: Payer: Self-pay

## 2020-01-23 VITALS — BP 139/76 | HR 73 | Temp 98.2°F | Ht 68.0 in | Wt 140.8 lb

## 2020-01-23 DIAGNOSIS — C50412 Malignant neoplasm of upper-outer quadrant of left female breast: Secondary | ICD-10-CM | POA: Diagnosis not present

## 2020-01-23 DIAGNOSIS — I10 Essential (primary) hypertension: Secondary | ICD-10-CM

## 2020-01-23 DIAGNOSIS — Z170 Estrogen receptor positive status [ER+]: Secondary | ICD-10-CM | POA: Diagnosis not present

## 2020-01-23 DIAGNOSIS — E782 Mixed hyperlipidemia: Secondary | ICD-10-CM

## 2020-01-23 MED ORDER — ROSUVASTATIN CALCIUM 5 MG PO TABS: 5.0000 mg | ORAL_TABLET | Freq: Every day | ORAL | 1 refills | Status: DC

## 2020-01-23 NOTE — Progress Notes (Signed)
Subjective:  Patient ID: [name] Riley, female    DOB: [birth year]  Age: 69 y.o. MRN: [id number]  CC: Follow-up (6 week)   HPI [name] presents for follow-up recheck of her cholesterol medication and blood pressure medications that were added at her most recent visit.  She states that her MRI of her brain done on March 19 came back normal with exception of what appeared to be some scar tissue.  She also had abdominal CT at that time which was negative for signs of spread of her breast cancer.  She continues to take the Crestor daily.  She says her legs do hurt a bit more than they did prior to starting the Crestor.  However, she is taking chemo and noted that there was leg pain since starting the chemo.  That is been going on for a year.  There was a little increase in the leg pain only.   Follow-up of hypertension. Patient has no history of headache chest pain or shortness of breath or recent cough. Patient also denies symptoms of TIA such as numbness weakness lateralizing. Patient checks  blood pressure at home and has not had any elevated readings recently. Patient denies side effects from his medication. States taking it regularly. The valsartan helped a lot she says.  She is noted her blood pressure to be far better when checked at other appointments or at home.  Depression screen [facility] 2/9 01/23/2020 12/12/2019 09/10/2019  Decreased Interest 0 0 0  Down, Depressed, Hopeless 0 0 0  PHQ - 2 Score 0 0 0  Altered sleeping - - 0  Tired, decreased energy - - 0  Change in appetite - - 0  Feeling bad or failure about yourself  - - 0  Trouble concentrating - - 0  Moving slowly or fidgety/restless - - 0  Suicidal thoughts - - 0  PHQ-9 Score - - 0  Some recent data might be hidden    History [name] Riley has a past medical history of Allergy, Breast cancer ([place]) (08/25/2011), Cancer [facility]), Coronary artery disease (2001), Heart attack ([place]) (09/2000), History of chemotherapy (comp. 08/22/2012),  Hyperlipidemia, Hypertension, Hypothyroidism, PONV (postoperative nausea and vomiting), and Status post radiation therapy (07/09/12 - 08/22/2012).   She has a past surgical history that includes Tonsillectomy (1968); Ovarian cyst surgery (Right, 1970); skin tags (05/09/1997); Portacath placement (11/14/2011); Breast surgery (1998); Breast surgery (11/14/11); Abdominal hysterectomy (1998); Appendectomy (1970); Port-a-cath removal (08/30/2012); Mastectomy (Bilateral); Portacath placement (N/A, 04/25/2016); Mass excision (Left, 06/22/2017); and Incision and drainage of wound (Left, 07/01/2017).   Her family history includes Cancer in her cousin, paternal aunt, paternal grandfather, and paternal uncle; Cancer (age of onset: 68) in her father; Diabetes in her maternal grandmother; Hypertension in her maternal grandmother and mother.She reports that she has never smoked. She has never used smokeless tobacco. She reports that she does not drink alcohol or use drugs.    ROS Review of Systems  Constitutional: Negative.   HENT: Negative.   Eyes: Negative for visual disturbance.  Respiratory: Negative for shortness of breath.   Cardiovascular: Negative for chest pain.  Gastrointestinal: Negative for abdominal pain.  Musculoskeletal: Positive for myalgias. Negative for arthralgias.    Objective:  BP 139/76   Pulse 73   Temp 98.2 F (36.8 C) (Temporal)   Ht 5' 8"  (1.727 m)   Wt 140 lb 12.8 oz (63.9 kg)   BMI 21.41 kg/m   BP Readings from Last 3 Encounters:  01/23/20 139/76  12/27/19 (!) 161/81  12/12/19 128/72    Wt Readings from Last 3 Encounters:  01/23/20 140 lb 12.8 oz (63.9 kg)  12/27/19 139 lb 9.6 oz (63.3 kg)  12/12/19 140 lb 12.8 oz (63.9 kg)     Physical Exam Constitutional:      General: She is not in acute distress.    Appearance: She is well-developed.  Cardiovascular:     Rate and Rhythm: Normal rate and regular rhythm.  Pulmonary:     Breath sounds: Normal breath sounds.    Skin:    General: Skin is warm and dry.  Neurological:     Mental Status: She is alert and oriented to person, place, and time.       Assessment & Plan:   Gwynneth was seen today for follow-up.  Diagnoses and all orders for this visit:  Essential hypertension -     BMP8+EGFR -     Lipid panel  Mixed hyperlipidemia -     BMP8+EGFR -     Lipid panel -     rosuvastatin (CRESTOR) 5 MG tablet; Take 1 tablet (5 mg total) by mouth daily at 6 PM. For cholesterol  Malignant neoplasm of upper-outer quadrant of left breast in female, estrogen receptor positive (HCC) -     BMP8+EGFR -     Lipid panel   Patient seems to be stable with regard to her cancer treatments.  There is a question of some leg pain from the cholesterol treatment and breast cancer treatment.  This seems to be manageable after discussion.  I would like her to check on her cholesterol level today.  Hopefully the lower dose of Crestor is tolerated well.  She is willing to continue from the standpoint of side effects.  However, increasing the dose may be hard to sell.  For now she should continue medications as is.  Since she is under the care of her cancer specialists I can delay her follow-up here for 6 months.    I am having Arbie Cookey A. Patchin maintain her multivitamin with minerals, acetaminophen, cholecalciferol, diphenhydrAMINE, letrozole, lidocaine-prilocaine, levETIRAcetam, ondansetron, predniSONE, valsartan, levothyroxine, metoprolol succinate, Verzenio, cephALEXin, and rosuvastatin.  Allergies as of 01/23/2020      Reactions   Cantaloupe Extract Allergy Skin Test Shortness Of Breath   Contrast Media [iodinated Diagnostic Agents] Shortness Of Breath, Rash   Pravastatin Other (See Comments)   Legs hurt   Zosyn [piperacillin Sod-tazobactam So] Rash, Other (See Comments)   Temperature increase, facial flushing      Medication List       Accurate as of January 23, 2020 12:26 PM. If you have any questions, ask your  nurse or doctor.        acetaminophen 500 MG tablet Commonly known as: TYLENOL Take 1,000 mg by mouth every 6 (six) hours as needed for moderate pain.   cephALEXin 500 MG capsule Commonly known as: KEFLEX Take 1 capsule (500 mg total) by mouth 2 (two) times daily.   cholecalciferol 1000 units tablet Commonly known as: VITAMIN D Take 1 tablet (1,000 Units total) by mouth daily.   diphenhydrAMINE 25 MG tablet Commonly known as: BENADRYL Take 50 mg by mouth at bedtime as needed for sleep.   letrozole 2.5 MG tablet Commonly known as: FEMARA TAKE ONE (1) TABLET EACH DAY   levETIRAcetam 500 MG tablet Commonly known as: KEPPRA TAKE ONE TABLET BY MOUTH TWICE DAILY   levothyroxine 100 MCG tablet Commonly known as: SYNTHROID TAKE ONE TABLET EACH  MORNING BEFORE BREAKFAST   lidocaine-prilocaine cream Commonly known as: EMLA APPLY TOPICALLY AS NEEDED FOR PORT ACCESS   metoprolol succinate 100 MG 24 hr tablet Commonly known as: TOPROL-XL Take 1 tablet (100 mg total) by mouth daily.   multivitamin with minerals tablet Take 1 tablet by mouth daily.   ondansetron 4 MG disintegrating tablet Commonly known as: ZOFRAN-ODT TAKE 1 TABLET EVERY 6 HOURS AS NEEDED FOR NAUSEA AND VOMITING   predniSONE 50 MG tablet Commonly known as: DELTASONE For contrast dye allergy   rosuvastatin 5 MG tablet Commonly known as: Crestor Take 1 tablet (5 mg total) by mouth daily at 6 PM. For cholesterol   valsartan 80 MG tablet Commonly known as: DIOVAN Take 1 tablet (80 mg total) by mouth daily. for blood pressure   Verzenio 150 MG tablet Generic drug: abemaciclib TAKE 1 TABLET (150 MG TOTAL) BY MOUTH 2 (TWO) TIMES DAILY.        Follow-up: Return in about 6 months (around 07/24/2020).  [name], M.D.

## 2020-01-24 ENCOUNTER — Other Ambulatory Visit: Payer: Self-pay

## 2020-01-24 ENCOUNTER — Inpatient Hospital Stay: Payer: Medicare Other | Attending: Hematology and Oncology

## 2020-01-24 VITALS — BP 143/82 | HR 67 | Temp 98.0°F | Resp 18

## 2020-01-24 DIAGNOSIS — C7951 Secondary malignant neoplasm of bone: Secondary | ICD-10-CM | POA: Insufficient documentation

## 2020-01-24 DIAGNOSIS — Z8042 Family history of malignant neoplasm of prostate: Secondary | ICD-10-CM | POA: Insufficient documentation

## 2020-01-24 DIAGNOSIS — C50412 Malignant neoplasm of upper-outer quadrant of left female breast: Secondary | ICD-10-CM | POA: Insufficient documentation

## 2020-01-24 DIAGNOSIS — Z803 Family history of malignant neoplasm of breast: Secondary | ICD-10-CM | POA: Diagnosis not present

## 2020-01-24 DIAGNOSIS — Z9221 Personal history of antineoplastic chemotherapy: Secondary | ICD-10-CM | POA: Diagnosis not present

## 2020-01-24 DIAGNOSIS — Z5112 Encounter for antineoplastic immunotherapy: Secondary | ICD-10-CM | POA: Insufficient documentation

## 2020-01-24 DIAGNOSIS — E785 Hyperlipidemia, unspecified: Secondary | ICD-10-CM | POA: Diagnosis not present

## 2020-01-24 DIAGNOSIS — R569 Unspecified convulsions: Secondary | ICD-10-CM | POA: Insufficient documentation

## 2020-01-24 DIAGNOSIS — C50919 Malignant neoplasm of unspecified site of unspecified female breast: Secondary | ICD-10-CM

## 2020-01-24 DIAGNOSIS — Z801 Family history of malignant neoplasm of trachea, bronchus and lung: Secondary | ICD-10-CM | POA: Insufficient documentation

## 2020-01-24 DIAGNOSIS — C7931 Secondary malignant neoplasm of brain: Secondary | ICD-10-CM | POA: Diagnosis not present

## 2020-01-24 DIAGNOSIS — I252 Old myocardial infarction: Secondary | ICD-10-CM | POA: Diagnosis not present

## 2020-01-24 DIAGNOSIS — I10 Essential (primary) hypertension: Secondary | ICD-10-CM | POA: Insufficient documentation

## 2020-01-24 DIAGNOSIS — Z79810 Long term (current) use of selective estrogen receptor modulators (SERMs): Secondary | ICD-10-CM | POA: Diagnosis not present

## 2020-01-24 DIAGNOSIS — Z923 Personal history of irradiation: Secondary | ICD-10-CM | POA: Insufficient documentation

## 2020-01-24 DIAGNOSIS — Z79899 Other long term (current) drug therapy: Secondary | ICD-10-CM | POA: Diagnosis not present

## 2020-01-24 DIAGNOSIS — K8020 Calculus of gallbladder without cholecystitis without obstruction: Secondary | ICD-10-CM | POA: Insufficient documentation

## 2020-01-24 DIAGNOSIS — I2510 Atherosclerotic heart disease of native coronary artery without angina pectoris: Secondary | ICD-10-CM | POA: Insufficient documentation

## 2020-01-24 DIAGNOSIS — Z170 Estrogen receptor positive status [ER+]: Secondary | ICD-10-CM | POA: Diagnosis not present

## 2020-01-24 DIAGNOSIS — E039 Hypothyroidism, unspecified: Secondary | ICD-10-CM | POA: Insufficient documentation

## 2020-01-24 LAB — BMP8+EGFR
BUN/Creatinine Ratio: 16 (ref 12–28)
BUN: 15 mg/dL (ref 8–27)
CO2: 21 mmol/L (ref 20–29)
Calcium: 9.5 mg/dL (ref 8.7–10.3)
Chloride: 101 mmol/L (ref 96–106)
Creatinine, Ser: 0.91 mg/dL (ref 0.57–1.00)
GFR calc Af Amer: 76 mL/min/{1.73_m2} (ref >59)
GFR calc non Af Amer: 65 mL/min/{1.73_m2} (ref >59)
Glucose: 81 mg/dL (ref 65–99)
Potassium: 4 mmol/L (ref 3.5–5.2)
Sodium: 140 mmol/L (ref 134–144)

## 2020-01-24 LAB — LIPID PANEL
Chol/HDL Ratio: 2.9 ratio (ref 0.0–4.4)
Cholesterol, Total: 252 mg/dL — ABNORMAL HIGH (ref 100–199)
HDL: 87 mg/dL (ref >39)
LDL Chol Calc (NIH): 144 mg/dL — ABNORMAL HIGH (ref 0–99)
Triglycerides: 120 mg/dL (ref 0–149)
VLDL Cholesterol Cal: 21 mg/dL (ref 5–40)

## 2020-01-24 MED ORDER — ACETAMINOPHEN 325 MG PO TABS
ORAL_TABLET | ORAL | Status: AC
  Filled 2020-01-24: qty 2

## 2020-01-24 MED ORDER — DIPHENHYDRAMINE HCL 25 MG PO CAPS
50.0000 mg | ORAL_CAPSULE | Freq: Once | ORAL | Status: AC
  Administered 2020-01-24: 50 mg via ORAL

## 2020-01-24 MED ORDER — TRASTUZUMAB-DKST CHEMO 150 MG IV SOLR
6.0000 mg/kg | Freq: Once | INTRAVENOUS | Status: AC
  Administered 2020-01-24: 399 mg via INTRAVENOUS
  Filled 2020-01-24: qty 19

## 2020-01-24 MED ORDER — ACETAMINOPHEN 325 MG PO TABS
650.0000 mg | ORAL_TABLET | Freq: Once | ORAL | Status: AC
  Administered 2020-01-24: 650 mg via ORAL

## 2020-01-24 MED ORDER — DIPHENHYDRAMINE HCL 25 MG PO CAPS
ORAL_CAPSULE | ORAL | Status: AC
  Filled 2020-01-24: qty 2

## 2020-01-24 MED ORDER — SODIUM CHLORIDE 0.9 % IV SOLN
Freq: Once | INTRAVENOUS | Status: AC
  Filled 2020-01-24: qty 250

## 2020-01-24 MED ORDER — HEPARIN SOD (PORK) LOCK FLUSH 100 UNIT/ML IV SOLN
500.0000 [IU] | Freq: Once | INTRAVENOUS | Status: AC | PRN
  Administered 2020-01-24: 500 [IU]
  Filled 2020-01-24: qty 5

## 2020-01-24 MED ORDER — SODIUM CHLORIDE 0.9% FLUSH
10.0000 mL | INTRAVENOUS | Status: DC | PRN
  Administered 2020-01-24: 10 mL
  Filled 2020-01-24: qty 10

## 2020-01-24 NOTE — Patient Instructions (Signed)
[facility] °Discharge Instructions for Patients Receiving Chemotherapy ° °Today you received the following chemotherapy agents Trastuzumab ° °To help prevent nausea and vomiting after your treatment, we encourage you to take your nausea medication as directed. °  °If you develop nausea and vomiting that is not controlled by your nausea medication, call the clinic.  ° °BELOW ARE SYMPTOMS THAT SHOULD BE REPORTED IMMEDIATELY: °· *FEVER GREATER THAN 100.5 F °· *CHILLS WITH OR WITHOUT FEVER °· NAUSEA AND VOMITING THAT IS NOT CONTROLLED WITH YOUR NAUSEA MEDICATION °· *UNUSUAL SHORTNESS OF BREATH °· *UNUSUAL BRUISING OR BLEEDING °· TENDERNESS IN MOUTH AND THROAT WITH OR WITHOUT PRESENCE OF ULCERS °· *URINARY PROBLEMS °· *BOWEL PROBLEMS °· UNUSUAL RASH °Items with * indicate a potential emergency and should be followed up as soon as possible. ° °Feel free to call the clinic should you have any questions or concerns. The clinic phone number is [phone]. ° °Please show the CHEMO ALERT CARD at check-in to the Emergency Department and triage nurse. ° ° °

## 2020-01-27 ENCOUNTER — Other Ambulatory Visit: Payer: Self-pay

## 2020-01-27 ENCOUNTER — Inpatient Hospital Stay (HOSPITAL_BASED_OUTPATIENT_CLINIC_OR_DEPARTMENT_OTHER): Payer: Medicare Other | Admitting: Internal Medicine

## 2020-01-27 VITALS — BP 141/80 | HR 80 | Temp 98.3°F | Resp 16 | Ht 68.0 in | Wt 140.0 lb

## 2020-01-27 DIAGNOSIS — Z5112 Encounter for antineoplastic immunotherapy: Secondary | ICD-10-CM | POA: Diagnosis not present

## 2020-01-27 DIAGNOSIS — C7931 Secondary malignant neoplasm of brain: Secondary | ICD-10-CM

## 2020-01-27 NOTE — Progress Notes (Signed)
[name] Riley at [address] [place], [place] [address] [phone]   Interval Evaluation  Date of Service: 01/27/20 Patient Name: [name] Riley Patient MRN: [id number] Patient DOB: 12-26-1950 Provider: Ventura Sellers, MD  Identifying Statement:  [name] Riley is a 69 y.o. female with seizures, weakness   Primary Cancer:  Oncologic History: Oncology History  Breast cancer of upper-outer quadrant of left female breast ([place])  11/14/2011 Surgery   Bilateral mastectomy, prophylactic on the right, left breast IDC 3/18 lymph nodes positive with extracapsular extension ER 89%, PR 81%, HER-2 negative, Ki-67 79% T2 N1 A. stage IIB   12/13/2011 - 06/28/2012 Chemotherapy   4 cycles of FEC followed by 4 cycles of Taxotere   07/17/2012 - 08/22/2012 Radiation Therapy   Adjuvant radiation therapy   08/22/2012 - 03/16/2016 Anti-estrogen oral therapy   Arimidex 1 mg daily   03/16/2016 Relapse/Recurrence   Subcutaneous nodule excision left chest: Infiltrating carcinoma breast primary, ER positive, PR negative   03/29/2016 Imaging   CT CAP and bone scan: Lytic lesions T8 vertebral, T1 posterior element, subcutaneous nodule left lateral chest wall, nonspecific lung nodules; Bone scan: Mets to kull, left humerus, left eighth rib, T7/T8, sternum, left acetabulum   04/28/2016 - 06/17/2017 Chemotherapy   Herceptin, lapatinib, Faslodex, Zometa every 4 weeks, lapatinib discontinued in September 2018 due to elevation of LFTs   06/22/2017 Relapse/Recurrence   Surgical excision:Soft tissue mass left lateral chest wall primary breast cancer, soft tissue mass left medial chest wall breast cancer, tumor is within the dermis extending to the subcutaneous adipose tissue and involves portions of skeletal muscle   06/22/2017 Cancer Staging   Staging form: Breast, AJCC 7th Edition - Pathologic stage from 06/22/2017: Stage IV (TX, NX, M1) - Signed by Nicholas Lose, MD on 12/27/2019     08/2017 - 02/16/2018 Chemotherapy   Faslodex with Herceptin and Perjeta along with Zometa every 4 weeks    03/02/2018 - 05/25/2018 Chemotherapy   Kadcyla    05/11/2018 Imaging   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 Associated vasogenic edema within the underlying right cerebral hemisphere without significant midline shift. Signal abnormality throughout the visualized bone marrow, compatible with osseous metastatic disease.      06/04/2018 Imaging   CT CAP: Right lower lobe lung nodule 7 mm (was 5 mm); multiple bone metastases throughout the spine and ribs sternum scapula and humerus, slightly increased lower thoracic mets, right renal lesion 2.5 cm (was 1.2 cm) right femur met increased from 2.1 cm to 2.9 cm   06/15/2018 -  Anti-estrogen oral therapy   Abemaciclib, Herceptin, letrozole, Xgeva   06/07/2019 Imaging   Progression of brain metastases,2 discrete dural-based lesions involving the posterior right frontal lobe. The more anterior lesion now measures 16.5 x 17.5 x 14 mm. The posterior lesion now measures 9.5 x 13 x 20 mm.  Vasogenic edema of the frontal and parietal lobes   06/21/2019 - 06/27/2019 Radiation Therapy   SRS to the brain   Metastatic breast cancer ([place])  06/29/2017 Initial Diagnosis   Metastatic breast cancer ([place])   06/15/2018 -  Chemotherapy   The patient had trastuzumab (HERCEPTIN) 546 mg in sodium chloride 0.9 % 250 mL chemo infusion, 8 mg/kg = 546 mg, Intravenous,  Once, 13 of 13 cycles Administration: 546 mg (06/15/2018), 399 mg (09/07/2018), 399 mg (07/13/2018), 399 mg (10/12/2018), 399 mg (11/02/2018), 399 mg (11/30/2018), 399 mg (01/24/2019), 399 mg (12/28/2018), 399 mg (02/22/2019), 399  mg (03/22/2019), 399 mg (04/17/2019), 399 mg (08/10/2018), 399 mg (05/17/2019) trastuzumab-dkst (OGIVRI) 399 mg in sodium chloride 0.9 % 250 mL chemo infusion, 6 mg/kg = 399 mg (100 % of original dose 6 mg/kg), Intravenous,  Once, 9 of 17 cycles Dose modification: 6  mg/kg (original dose 6 mg/kg, Cycle 14, Reason: Other (see comments), Comment: Biosimilar Conversion) Administration: 399 mg (06/14/2019), 399 mg (07/12/2019), 399 mg (08/09/2019), 399 mg (09/06/2019), 399 mg (10/04/2019), 399 mg (11/01/2019), 399 mg (11/29/2019), 399 mg (12/27/2019), 399 mg (01/24/2020)  for chemotherapy treatment.      Interval History:  [name] Riley presents today after recent MRI brain.  She describes stability of left hand function. She has not experienced any further seizures.  Denies headaches.  Medications: Current Outpatient Medications on File Prior to Visit  Medication Sig Dispense Refill  . acetaminophen (TYLENOL) 500 MG tablet Take 1,000 mg by mouth every 6 (six) hours as needed for moderate pain.    . cephALEXin (KEFLEX) 500 MG capsule Take 1 capsule (500 mg total) by mouth 2 (two) times daily. (Patient not taking: Reported on 01/23/2020) 14 capsule 0  . cholecalciferol (VITAMIN D) 1000 units tablet Take 1 tablet (1,000 Units total) by mouth daily.    . diphenhydrAMINE (BENADRYL) 25 MG tablet Take 50 mg by mouth at bedtime as needed for sleep.    Marland Kitchen letrozole (FEMARA) 2.5 MG tablet TAKE ONE (1) TABLET EACH DAY 90 tablet 3  . levETIRAcetam (KEPPRA) 500 MG tablet TAKE ONE TABLET BY MOUTH TWICE DAILY 60 tablet 2  . levothyroxine (SYNTHROID) 100 MCG tablet TAKE ONE TABLET EACH MORNING BEFORE BREAKFAST 90 tablet 3  . lidocaine-prilocaine (EMLA) cream APPLY TOPICALLY AS NEEDED FOR PORT ACCESS 30 g 3  . metoprolol succinate (TOPROL-XL) 100 MG 24 hr tablet Take 1 tablet (100 mg total) by mouth daily. 90 tablet 3  . Multiple Vitamins-Minerals (MULTIVITAMIN WITH MINERALS) tablet Take 1 tablet by mouth daily.      . ondansetron (ZOFRAN-ODT) 4 MG disintegrating tablet TAKE 1 TABLET EVERY 6 HOURS AS NEEDED FOR NAUSEA AND VOMITING 30 tablet 3  . predniSONE (DELTASONE) 50 MG tablet For contrast dye allergy 3 tablet 0  . rosuvastatin (CRESTOR) 5 MG tablet Take 1 tablet (5 mg total) by  mouth daily at 6 PM. For cholesterol 90 tablet 1  . valsartan (DIOVAN) 80 MG tablet Take 1 tablet (80 mg total) by mouth daily. for blood pressure 90 tablet 1  . VERZENIO 150 MG tablet TAKE 1 TABLET (150 MG TOTAL) BY MOUTH 2 (TWO) TIMES DAILY. 56 tablet 0   Current Facility-Administered Medications on File Prior to Visit  Medication Dose Route Frequency Provider Last Rate Last Admin  . 0.9 %  sodium chloride infusion   Intravenous Continuous Donnie Mesa, MD      . acetaminophen (TYLENOL) tablet 975 mg  975 mg Oral Q6H Donnie Mesa, MD      . enoxaparin (LOVENOX) injection 40 mg  40 mg Subcutaneous Q24H Donnie Mesa, MD      . morphine 4 MG/ML injection 2-4 mg  2-4 mg Intravenous Q2H PRN Donnie Mesa, MD      . ondansetron (ZOFRAN-ODT) disintegrating tablet 4 mg  4 mg Oral Q6H PRN Donnie Mesa, MD       Or  . ondansetron (ZOFRAN) 4 mg in sodium chloride 0.9 % 50 mL IVPB  4 mg Intravenous Q6H PRN Donnie Mesa, MD      . oxyCODONE (Oxy IR/ROXICODONE) immediate release tablet  5-10 mg  5-10 mg Oral Q4H PRN Donnie Mesa, MD      . piperacillin-tazobactam (ZOSYN) 3.375 g in dextrose 5 % 50 mL IVPB  3.375 g Intravenous Q8H Donnie Mesa, MD      . prochlorperazine (COMPAZINE) tablet 10 mg  10 mg Oral Q6H PRN Donnie Mesa, MD       Or  . prochlorperazine (COMPAZINE) injection 5-10 mg  5-10 mg Intravenous Q6H PRN Donnie Mesa, MD        Allergies:  Allergies  Allergen Reactions  . Cantaloupe Extract Allergy Skin Test Shortness Of Breath  . Contrast Media [Iodinated Diagnostic Agents] Shortness Of Breath and Rash  . Pravastatin Other (See Comments)    Legs hurt  . Zosyn [Piperacillin Sod-Tazobactam So] Rash and Other (See Comments)    Temperature increase, facial flushing   Past Medical History:  Past Medical History:  Diagnosis Date  . Allergy   . Breast cancer ([place]) 08/25/2011   L , invasive ductal carcinoma, ER/PR +,HER2 -  . Cancer [facility])    left breast cancer  .  Coronary artery disease 2001  . Heart attack [facility]) 09/2000   Sep 25, 2000  --no intervention  . History of chemotherapy comp. 08/22/2012   4 cycles of FEC and $ cycles of Taxotere  . Hyperlipidemia   . Hypertension   . Hypothyroidism   . PONV (postoperative nausea and vomiting)    gets sick from anesthesia  . Status post radiation therapy 07/09/12 - 08/22/2012   Left Breast, 60.4 gray   Past Surgical History:  Past Surgical History:  Procedure Laterality Date  . ABDOMINAL HYSTERECTOMY  1998   TAH, oophorectomy  . APPENDECTOMY  1970  . BREAST SURGERY  1998   removal of benign lump in rt breast  . BREAST SURGERY  11/14/11   right simple mastectomy, left mrm  . INCISION AND DRAINAGE OF WOUND Left 07/01/2017   Procedure: IRRIGATION AND DEBRIDEMENT CHEST WALL ABSCESS;  Surgeon: Donnie Mesa, MD;  Location: [facility];  Service: General;  Laterality: Left;  Marland Kitchen MASS EXCISION Left 06/22/2017   Procedure: EXCISION OF CHEST WALL MASSES;  Surgeon: Donnie Mesa, MD;  Location: [place];  Service: General;  Laterality: Left;  Marland Kitchen MASTECTOMY Bilateral    for left breast cancer  . OVARIAN CYST SURGERY Right 1970  . PORT-A-CATH REMOVAL  08/30/2012   Procedure: REMOVAL PORT-A-CATH;  Surgeon: Imogene Burn. [name], MD;  Location: [place];  Service: General;  Laterality: Right;  port removal  . PORTACATH PLACEMENT  11/14/2011   Procedure: INSERTION PORT-A-CATH;  Surgeon: Imogene Burn. [name], MD;  Location: [place];  Service: General;  Laterality: Right;  . PORTACATH PLACEMENT N/A 04/25/2016   Procedure: INSERTION PORT-A-CATH LEFT CHEST;  Surgeon: Donnie Mesa, MD;  Location: [place];  Service: General;  Laterality: N/A;  . skin tags  05/09/1997   left axillary left neck skin tags  . TONSILLECTOMY  1968   Social History:  Social History   Socioeconomic History  . Marital status: Married    Spouse name: Not on file  . Number of children: 3  . Years of education: Not on file  .  Highest education level: Not on file  Occupational History  . Occupation: Works at [employer]  Tobacco Use  . Smoking status: Never Smoker  . Smokeless tobacco: Never Used  Substance and Sexual Activity  . Alcohol use: No  . Drug use: No  . Sexual  activity: Yes    Birth control/protection: Surgical    Comment: menarche 74, Parity age 32, G107, P43, 1 miscarriage,  HRT x 5-10 yrs, Mild Hot Flashes  Other Topics Concern  . Not on file  Social History Narrative   Lives at home.   Social Determinants of Health   Financial Resource Strain:   . Difficulty of Paying Living Expenses:   Food Insecurity:   . Worried About Charity fundraiser in the Last Year:   . Arboriculturist in the Last Year:   Transportation Needs: No Transportation Needs  . Lack of Transportation (Medical): No  . Lack of Transportation (Non-Medical): No  Physical Activity:   . Days of Exercise per Week:   . Minutes of Exercise per Session:   Stress:   . Feeling of Stress :   Social Connections:   . Frequency of Communication with Friends and Family:   . Frequency of Social Gatherings with Friends and Family:   . Attends Religious Services:   . Active Member of Clubs or Organizations:   . Attends Archivist Meetings:   Marland Kitchen Marital Status:   Intimate Partner Violence:   . Fear of Current or Ex-Partner:   . Emotionally Abused:   Marland Kitchen Physically Abused:   . Sexually Abused:    Family History:  Family History  Problem Relation Age of Onset  . Hypertension Maternal Grandmother   . Diabetes Maternal Grandmother   . Cancer Father 39       lung cancer and Prostate Cancer  . Hypertension Mother   . Cancer Paternal Aunt        ovarian  . Cancer Cousin        breast, paternal cousin  . Cancer Paternal Uncle        stomach  . Cancer Paternal Grandfather        Esophagus  . Colon cancer Neg Hx     Review of Systems: Constitutional: Denies fevers, chills or abnormal weight loss Eyes: Denies  blurriness of vision Ears, nose, mouth, throat, and face: Denies mucositis or sore throat Respiratory: Denies cough, dyspnea or wheezes Cardiovascular: Denies palpitation, chest discomfort or lower extremity swelling Gastrointestinal:  Denies nausea, constipation, diarrhea GU: Denies dysuria or incontinence Skin: Denies abnormal skin rashes Neurological: Per HPI Musculoskeletal: Denies joint pain, back or neck discomfort. No decrease in ROM Behavioral/Psych: Denies anxiety, disturbance in thought content, and mood instability   Physical Exam: Vitals:   01/27/20 1204  BP: (!) 141/80  Pulse: 80  Resp: 16  Temp: 98.3 F (36.8 C)  SpO2: 98%   KPS: 80. General: Alert, cooperative, pleasant, in no acute distress Head: Normal EENT: No conjunctival injection or scleral icterus. Oral mucosa moist Lungs: Resp effort normal Cardiac: Regular rate and rhythm Abdomen: Soft, non-distended abdomen Skin: No rashes cyanosis or petechiae. Extremities: No clubbing or edema  Neurologic Exam: Mental Status: Awake, alert, attentive to examiner. Oriented to self and environment. Language is fluent with intact comprehension.  Cranial Nerves: Visual acuity is grossly normal. Visual fields are full. Extra-ocular movements intact. No ptosis. Face is symmetric, tongue midline. Motor: Tone and bulk are normal. Polymini myoclonus with fixed motor set. Reflexes are symmetric, no pathologic reflexes present. Intact finger to nose bilaterally Sensory: Normal Gait: Normal and tandem gait is deferred   Labs: I have reviewed the data as listed    Component Value Date/Time   NA 140 01/23/2020 0857   NA 140 09/22/2017 0958  K 4.0 01/23/2020 0857   K 3.7 09/22/2017 0958   CL 101 01/23/2020 0857   CL 105 08/10/2012 0908   CO2 21 01/23/2020 0857   CO2 26 09/22/2017 0958   GLUCOSE 81 01/23/2020 0857   GLUCOSE 100 (H) 12/27/2019 1055   GLUCOSE 80 09/22/2017 0958   GLUCOSE 81 08/10/2012 0908   BUN 15  01/23/2020 0857   BUN 12.8 09/22/2017 0958   CREATININE 0.91 01/23/2020 0857   CREATININE 0.88 12/27/2019 1055   CREATININE 0.6 09/22/2017 0958   CALCIUM 9.5 01/23/2020 0857   CALCIUM 9.1 09/22/2017 0958   PROT 6.7 12/27/2019 1055   PROT 6.5 12/12/2019 0800   PROT 7.0 09/22/2017 0958   ALBUMIN 3.6 12/27/2019 1055   ALBUMIN 4.2 12/12/2019 0800   ALBUMIN 4.0 09/22/2017 0958   AST 45 (H) 12/27/2019 1055   AST 37 (H) 09/22/2017 0958   ALT 26 12/27/2019 1055   ALT 31 09/22/2017 0958   ALKPHOS 186 (H) 12/27/2019 1055   ALKPHOS 140 09/22/2017 0958   BILITOT 0.5 12/27/2019 1055   BILITOT 0.41 09/22/2017 0958   GFRNONAA 65 01/23/2020 0857   GFRNONAA >60 12/27/2019 1055   GFRAA 76 01/23/2020 0857   GFRAA >60 12/27/2019 1055   Lab Results  Component Value Date   WBC 2.4 (L) 12/27/2019   NEUTROABS 0.9 (L) 12/27/2019   HGB 9.7 (L) 12/27/2019   HCT 29.3 (L) 12/27/2019   MCV 107.3 (H) 12/27/2019   PLT 62 (L) 12/27/2019     Imaging:  [place] Clinician Interpretation: I have personally reviewed the CNS images as listed.  My interpretation, in the context of the patient's clinical presentation, is stable disease  CT Chest W Contrast  Result Date: 01/02/2020 CLINICAL DATA:  Restaging metastatic cancer EXAM: CT CHEST, ABDOMEN, AND PELVIS WITH CONTRAST TECHNIQUE: Multidetector CT imaging of the chest, abdomen and pelvis was performed following the standard protocol during bolus administration of intravenous contrast. CONTRAST:  130m OMNIPAQUE IOHEXOL 300 MG/ML  SOLN COMPARISON:  07/05/19 FINDINGS: CT CHEST FINDINGS Cardiovascular: The heart size appears normal. Mild aortic atherosclerosis. No pericardial effusion Mediastinum/Nodes: Normal appearance of the thyroid gland. The trachea appears patent and is midline. Normal appearance of the esophagus. No supraclavicular, axillary, mediastinal or hilar adenopathy. Lungs/Pleura: No pleural effusion. Parenchymal bands identified within the right lower  lobe. Subpleural fibrotic changes within the anterior left upper lobe. Right lower lobe perifissural nodule measures 3 mm, image 74/100. Unchanged. Musculoskeletal: Multifocal sclerotic bone metastases are again identified. The appearance is stable compared with 07/05/2019. CT ABDOMEN PELVIS FINDINGS Hepatobiliary: No focal liver abnormality is seen. 5 mm gallstone is identified. No gallbladder wall inflammation or bile duct dilatation. Pancreas: Unremarkable. No pancreatic ductal dilatation or surrounding inflammatory changes. Spleen: 5 mm low-attenuation structure within the spleen is identified, image 56/2 unchanged. Adrenals/Urinary Tract: Normal adrenal glands. There are several left kidney cysts noted. Scarring identified within the right kidney. No solid enhancing mass or hydronephrosis. Urinary bladder is unremarkable. Stomach/Bowel: Stomach is within normal limits. Appendix appears normal. No evidence of bowel wall thickening, distention, or inflammatory changes. Vascular/Lymphatic: No significant vascular findings are present. No enlarged abdominal or pelvic lymph nodes. Reproductive: Status post hysterectomy. No adnexal masses. Other: No free fluid or fluid collections identified. Musculoskeletal: Widespread sclerotic bone metastases are identified. These are unchanged when compared with the previous exam. IMPRESSION: 1. Widespread sclerotic bone metastases.  Unchanged from prior exam. 2. No findings of nodal metastasis or solid organ metastasis. 3. Gallstone 4.  Aortic Atherosclerosis (ICD10-I70.0). Electronically Signed   By: Kerby Moors M.D.   On: 01/02/2020 12:24   MR Brain W Wo Contrast  Result Date: 01/03/2020 CLINICAL DATA:  Follow-up metastatic breast cancer. EXAM: MRI HEAD WITHOUT AND WITH CONTRAST TECHNIQUE: Multiplanar, multiecho pulse sequences of the brain and surrounding structures were obtained without and with intravenous contrast. CONTRAST:  47m GADAVIST GADOBUTROL 1 MMOL/ML IV  SOLN COMPARISON:  09/26/2019.  06/05/2019. FINDINGS: Brain: No convincing change since the study December. Edema pattern within the white matter the right frontoparietal vertex is stable. Three regions enhancement along the dura and leptomeningeal surface of the brain on the right appear stable or very similar. Two are present, nearly contiguous, at the frontal and frontoparietal vertex on the right and a third is present more inferiorly in the posterior frontal region. On the axial and coronal imaging, I wondered if there could be slight increase in prominence of the inferior margins of the 2 higher regions, but on the sagittal imaging that does not appear to be true. No new metastatic lesion. No hydrocephalus or extra-axial collection. No ischemic infarction. Vascular: Major vessels at the base of the brain show flow. Skull and upper cervical spine: Regional bony metastatic disease appears the same. 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 On the axial and coronal imaging, I wondered if there could be slight increasing prominence along the inferior margins of the 2 upper areas, but that suspicion is not borne out on the sagittal imaging. If it is a change, it is very minimal. The edema pattern is stable. Electronically Signed   By: MNelson ChimesM.D.   On: 01/03/2020 15:56   CT Abdomen Pelvis W Contrast  Result Date: 01/02/2020 CLINICAL DATA:  Restaging metastatic cancer EXAM: CT CHEST, ABDOMEN, AND PELVIS WITH CONTRAST TECHNIQUE: Multidetector CT imaging of the chest, abdomen and pelvis was performed following the standard protocol during bolus administration of intravenous contrast. CONTRAST:  1033mOMNIPAQUE IOHEXOL 300 MG/ML  SOLN COMPARISON:  07/05/19 FINDINGS: CT CHEST FINDINGS Cardiovascular: The heart size appears normal. Mild aortic atherosclerosis. No pericardial  effusion Mediastinum/Nodes: Normal appearance of the thyroid gland. The trachea appears patent and is midline. Normal appearance of the esophagus. No supraclavicular, axillary, mediastinal or hilar adenopathy. Lungs/Pleura: No pleural effusion. Parenchymal bands identified within the right lower lobe. Subpleural fibrotic changes within the anterior left upper lobe. Right lower lobe perifissural nodule measures 3 mm, image 74/100. Unchanged. Musculoskeletal: Multifocal sclerotic bone metastases are again identified. The appearance is stable compared with 07/05/2019. CT ABDOMEN PELVIS FINDINGS Hepatobiliary: No focal liver abnormality is seen. 5 mm gallstone is identified. No gallbladder wall inflammation or bile duct dilatation. Pancreas: Unremarkable. No pancreatic ductal dilatation or surrounding inflammatory changes. Spleen: 5 mm low-attenuation structure within the spleen is identified, image 56/2 unchanged. Adrenals/Urinary Tract: Normal adrenal glands. There are several left kidney cysts noted. Scarring identified within the right kidney. No solid enhancing mass or hydronephrosis. Urinary bladder is unremarkable. Stomach/Bowel: Stomach is within normal limits. Appendix appears normal. No evidence of bowel wall thickening, distention, or inflammatory changes. Vascular/Lymphatic: No significant vascular findings are present. No enlarged abdominal or pelvic lymph nodes. Reproductive: Status post hysterectomy. No adnexal masses. Other: No free fluid or fluid collections identified. Musculoskeletal: Widespread sclerotic bone metastases are identified. These are unchanged when compared with the previous exam. IMPRESSION: 1. Widespread sclerotic bone metastases.  Unchanged from prior  exam. 2. No findings of nodal metastasis or solid organ metastasis. 3. Gallstone 4.  Aortic Atherosclerosis (ICD10-I70.0). Electronically Signed   By: Kerby Moors M.D.   On: 01/02/2020 12:24      Assessment/Plan 1. Brain  metastasis [facility])  Ms. Louthan is clinically and radiographically progressive today, now 6 months s/p SRS to dural based metastases.   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  We appreciate the opportunity to participate in the care of CDashea McmullanPriddy.    We ask that CHANAE WAITERSreturn to clinic in 3 months following next brain MRI, or sooner as needed.    All questions were answered. The patient knows to call the clinic with any problems, questions or concerns. No barriers to learning were detected.  The total time spent in the encounter was 30 miniutes and more than 50% was on counseling and review of test results   ZVentura Sellers MD Medical Director of Neuro-Oncology [facility]04/12/21 11:52 AM

## 2020-01-28 ENCOUNTER — Telehealth: Payer: Self-pay | Admitting: Internal Medicine

## 2020-01-28 NOTE — Telephone Encounter (Signed)
Scheduled appt per 4/12 los.  Spoke with pt and she is aware of the appt date and time.

## 2020-01-30 ENCOUNTER — Other Ambulatory Visit: Payer: Self-pay | Admitting: Hematology and Oncology

## 2020-01-30 DIAGNOSIS — Z170 Estrogen receptor positive status [ER+]: Secondary | ICD-10-CM

## 2020-01-30 DIAGNOSIS — C50412 Malignant neoplasm of upper-outer quadrant of left female breast: Secondary | ICD-10-CM

## 2020-02-05 MED FILL — VERZENIO 150 MG TAB: 150 | 28 days supply | Qty: 56 | Fill #0

## 2020-02-07 ENCOUNTER — Ambulatory Visit: Payer: Medicare Other

## 2020-02-07 ENCOUNTER — Ambulatory Visit: Payer: Medicare Other | Admitting: Hematology and Oncology

## 2020-02-07 ENCOUNTER — Other Ambulatory Visit: Payer: Medicare Other

## 2020-02-13 ENCOUNTER — Ambulatory Visit: Payer: Medicare Other

## 2020-02-14 ENCOUNTER — Other Ambulatory Visit: Payer: Medicare Other

## 2020-02-14 ENCOUNTER — Ambulatory Visit: Payer: Medicare Other | Admitting: Hematology and Oncology

## 2020-02-14 ENCOUNTER — Ambulatory Visit: Payer: Medicare Other

## 2020-02-17 NOTE — Progress Notes (Signed)
Pharmacist Chemotherapy Monitoring - Follow Up Assessment    I verify that I have reviewed each item in the below checklist:  . Regimen for the patient is scheduled for the appropriate day and plan matches scheduled date. Marland Kitchen Appropriate non-routine labs are ordered dependent on drug ordered. . If applicable, additional medications reviewed and ordered per protocol based on lifetime cumulative doses and/or treatment regimen.   Plan for follow-up and/or issues identified: No . I-vent associated with next due treatment: No . MD and/or nursing notified: No  Rowena Moilanen K 02/17/2020 11:23 AM

## 2020-02-18 ENCOUNTER — Ambulatory Visit (INDEPENDENT_AMBULATORY_CARE_PROVIDER_SITE_OTHER): Payer: Medicare Other

## 2020-02-18 DIAGNOSIS — Z0000 Encounter for general adult medical examination without abnormal findings: Secondary | ICD-10-CM | POA: Diagnosis not present

## 2020-02-18 NOTE — Progress Notes (Signed)
[place] VISIT  02/18/2020  Telephone Visit Disclaimer This Medicare AWV was conducted by telephone due to national recommendations for restrictions regarding the COVID-19 Pandemic (e.g. social distancing).  I verified, using two identifiers, that I am speaking with [name] Riley or their authorized healthcare agent. I discussed the limitations, risks, security, and privacy concerns of performing an evaluation and management service by telephone and the potential availability of an in-person appointment in the future. The patient expressed understanding and agreed to proceed.   Subjective:  [name] Riley is a 69 y.o. female patient of Stacks, Cletus Gash, MD who had a Medicare Annual Wellness Visit today via telephone. [name] Riley is a very pleasant lady who lives in nearby [place]. 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 She is married and her husband and her grandson currently live with her. Her grandson is living with her until he can get his own place. She enjoys adult coloring books, yardwork, and reading. She tries to walk as much as she can for exercise. She also takes care of her elderly mother. Her and her husband have 1 daughter and 2 sons. She is currently seeing oncology and receiving treatments. She has had her Covid vaccines and per her GI doctor she is to have her colonoscopy by the end of this year. I mailed some information on Advanced Directives and also a heart healthy diet.   Patient Care Team: [name], MD as PCP - General (Family Medicine) Nicholas Lose, MD as Consulting Physician (Hematology and Oncology)  Advanced Directives 02/18/2020 01/27/2020 01/24/2020 09/30/2019 06/13/2019 08/10/2018 07/01/2018  Does Patient Have a Medical Advance Directive? No No No No No No No  Type of Advance Directive - - - - - - -  Does patient want to make changes to medical advance directive? - - - - - - -  Would patient like information on  creating a medical advance directive? No - Patient declined No - Patient declined No - Patient declined - No - Patient declined No - Patient declined -  Pre-existing out of facility DNR order (yellow form or pink MOST form) - - - - - - -    Hospital Utilization Over the Past 12 Months: # of hospitalizations or ER visits: 0 # of surgeries: 0  Review of Systems    Patient reports that her overall health is unchanged compared to last year.  History obtained from chart review  Patient Reported Readings (BP, Pulse, CBG, Weight, etc) none  Pain Assessment Pain : 0-10 Pain Score: 5  Pain Type: Chronic pain Pain Location: Hip Pain Descriptors / Indicators: Shooting Pain Onset: More than a month ago Pain Frequency: Intermittent Pain Relieving Factors: Tylenol very rarely. Once she starts moving it around she feels better  Pain Relieving Factors: Tylenol very rarely. Once she starts moving it around she feels better  Current Medications & Allergies (verified) Allergies as of 02/18/2020      Reactions   Cantaloupe Extract Allergy Skin Test Shortness Of Breath   Contrast Media [iodinated Diagnostic Agents] Shortness Of Breath, Rash   Pravastatin Other (See Comments)   Legs hurt   Zosyn [piperacillin Sod-tazobactam So] Rash, Other (See Comments)   Temperature increase, facial flushing      Medication List       Accurate as of Feb 18, 2020  1:54 PM. If you have any questions, ask your nurse or doctor.  acetaminophen 500 MG tablet Commonly known as: TYLENOL Take 1,000 mg by mouth every 6 (six) hours as needed for moderate pain.   cholecalciferol 1000 units tablet Commonly known as: VITAMIN D Take 1 tablet (1,000 Units total) by mouth daily.   diphenhydrAMINE 25 MG tablet Commonly known as: BENADRYL Take 50 mg by mouth at bedtime as needed for sleep.   letrozole 2.5 MG tablet Commonly known as: FEMARA TAKE ONE (1) TABLET EACH DAY   levETIRAcetam 500 MG  tablet Commonly known as: KEPPRA TAKE ONE TABLET BY MOUTH TWICE DAILY   levothyroxine 100 MCG tablet Commonly known as: SYNTHROID TAKE ONE TABLET EACH MORNING BEFORE BREAKFAST   lidocaine-prilocaine cream Commonly known as: EMLA APPLY TOPICALLY AS NEEDED FOR PORT ACCESS   metoprolol succinate 100 MG 24 hr tablet Commonly known as: TOPROL-XL Take 1 tablet (100 mg total) by mouth daily.   multivitamin with minerals tablet Take 1 tablet by mouth daily.   ondansetron 4 MG disintegrating tablet Commonly known as: ZOFRAN-ODT TAKE 1 TABLET EVERY 6 HOURS AS NEEDED FOR NAUSEA AND VOMITING   predniSONE 50 MG tablet Commonly known as: DELTASONE For contrast dye allergy   rosuvastatin 10 MG tablet Commonly known as: CRESTOR Take 10 mg by mouth daily.   valsartan 80 MG tablet Commonly known as: DIOVAN Take 1 tablet (80 mg total) by mouth daily. for blood pressure   Verzenio 150 MG tablet Generic drug: abemaciclib TAKE 1 TABLET (150 MG TOTAL) BY MOUTH 2 (TWO) TIMES DAILY.       History (reviewed): Past Medical History:  Diagnosis Date  . Allergy   . Breast cancer ([facility]) 08/25/2011   L , invasive ductal carcinoma, ER/PR +,HER2 -  . Cancer [facility])    left breast cancer  . Coronary artery disease 2001  . Heart attack [facility]) 09/2000   Sep 25, 2000  --no intervention  . History of chemotherapy comp. 08/22/2012   4 cycles of FEC and $ cycles of Taxotere  . Hyperlipidemia   . Hypertension   . Hypothyroidism   . PONV (postoperative nausea and vomiting)    gets sick from anesthesia  . Status post radiation therapy 07/09/12 - 08/22/2012   Left Breast, 60.4 gray   Past Surgical History:  Procedure Laterality Date  . ABDOMINAL HYSTERECTOMY  1998   TAH, oophorectomy  . APPENDECTOMY  1970  . BREAST SURGERY  1998   removal of benign lump in rt breast  . BREAST SURGERY  11/14/11   right simple mastectomy, left mrm  . INCISION AND DRAINAGE OF WOUND Left 07/01/2017   Procedure:  IRRIGATION AND DEBRIDEMENT CHEST WALL ABSCESS;  Surgeon: Donnie Mesa, MD;  Location: [facility];  Service: General;  Laterality: Left;  Marland Kitchen MASS EXCISION Left 06/22/2017   Procedure: EXCISION OF CHEST WALL MASSES;  Surgeon: Donnie Mesa, MD;  Location: [place];  Service: General;  Laterality: Left;  Marland Kitchen MASTECTOMY Bilateral    for left breast cancer  . OVARIAN CYST SURGERY Right 1970  . PORT-A-CATH REMOVAL  08/30/2012   Procedure: REMOVAL PORT-A-CATH;  Surgeon: Imogene Burn. [name], MD;  Location: [place];  Service: General;  Laterality: Right;  port removal  . PORTACATH PLACEMENT  11/14/2011   Procedure: INSERTION PORT-A-CATH;  Surgeon: Imogene Burn. [name], MD;  Location: [place];  Service: General;  Laterality: Right;  . PORTACATH PLACEMENT N/A 04/25/2016   Procedure: INSERTION PORT-A-CATH LEFT CHEST;  Surgeon: Donnie Mesa, MD;  Location: [place];  Service:  General;  Laterality: N/A;  . skin tags  05/09/1997   left axillary left neck skin tags  . TONSILLECTOMY  1968   Family History  Problem Relation Age of Onset  . Hypertension Maternal Grandmother   . Diabetes Maternal Grandmother   . Cancer Father 4       lung cancer and Prostate Cancer  . Hypertension Mother   . Cancer Paternal Aunt        ovarian  . Cancer Cousin        breast, paternal cousin  . Cancer Paternal Uncle        stomach  . Cancer Paternal Grandfather        Esophagus  . Colon cancer Neg Hx    Social History   Socioeconomic History  . Marital status: Married    Spouse name: Not on file  . Number of children: 3  . Years of education: Not on file  . Highest education level: High school graduate  Occupational History  . Occupation: Works at [employer]  Tobacco Use  . Smoking status: Never Smoker  . Smokeless tobacco: Never Used  Substance and Sexual Activity  . Alcohol use: No  . Drug use: No  . Sexual activity: Yes    Birth control/protection: Surgical    Comment: menarche  89, Parity age 40, G30, P3, 1 miscarriage,  HRT x 5-10 yrs, Mild Hot Flashes  Other Topics Concern  . Not on file  Social History Narrative   Lives at home.   Social Determinants of Health   Financial Resource Strain:   . Difficulty of Paying Living Expenses:   Food Insecurity:   . Worried About Charity fundraiser in the Last Year:   . Arboriculturist in the Last Year:   Transportation Needs: No Transportation Needs  . Lack of Transportation (Medical): No  . Lack of Transportation (Non-Medical): No  Physical Activity:   . Days of Exercise per Week:   . Minutes of Exercise per Session:   Stress:   . Feeling of Stress :   Social Connections:   . Frequency of Communication with Friends and Family:   . Frequency of Social Gatherings with Friends and Family:   . Attends Religious Services:   . Active Member of Clubs or Organizations:   . Attends Archivist Meetings:   Marland Kitchen Marital Status:     Activities of Daily Living In your present state of health, do you have any difficulty performing the following activities: 02/18/2020  Hearing? N  Vision? Y  Comment Wears glassess all the time  Difficulty concentrating or making decisions? N  Walking or climbing stairs? N  Dressing or bathing? N  Doing errands, shopping? N  Preparing Food and eating ? N  Using the Toilet? N  In the past six months, have you accidently leaked urine? N  Do you have problems with loss of bowel control? N  Managing your Medications? N  Managing your Finances? N  Housekeeping or managing your Housekeeping? N  Some recent data might be hidden    Patient Education/ Literacy How often do you need to have someone help you when you read instructions, pamphlets, or other written materials from your doctor or pharmacy?: 1 - Never What is the last grade level you completed in school?: 12th grade  Exercise Current Exercise Habits: The patient does not participate in regular exercise at present, Exercise  limited by: orthopedic condition(s)  Diet Patient reports  consuming 3 meals a day and 2 snack(s) a day Patient reports that her primary diet is: reduced fat Patient reports that she does have regular access to food.   Depression Screen PHQ 2/9 Scores 02/18/2020 01/23/2020 12/12/2019 09/10/2019 04/24/2019  PHQ - 2 Score 0 0 0 0 0  PHQ- 9 Score - - - 0 -     Fall Risk Fall Risk  02/18/2020 01/23/2020 12/12/2019 09/10/2019 04/24/2019  Falls in the past year? 1 1 1 1  0  Number falls in past yr: 0 0 1 0 -  Injury with Fall? 0 0 0 1 -  Risk for fall due to : - History of fall(s) History of fall(s) History of fall(s) -  Follow up - Falls evaluation completed Falls evaluation completed Falls prevention discussed -     Objective:  [name] Riley seemed alert and oriented and she participated appropriately during our telephone visit.  Blood Pressure Weight BMI  BP Readings from Last 3 Encounters:  01/27/20 (!) 141/80  01/24/20 (!) 143/82  01/23/20 139/76   Wt Readings from Last 3 Encounters:  01/27/20 140 lb (63.5 kg)  01/23/20 140 lb 12.8 oz (63.9 kg)  12/27/19 139 lb 9.6 oz (63.3 kg)   BMI Readings from Last 1 Encounters:  01/27/20 21.29 kg/m    *Unable to obtain current vital signs, weight, and BMI due to telephone visit type  Hearing/Vision  . Jaedah did not seem to have difficulty with hearing/understanding during the telephone conversation . Reports that she has had a formal eye exam by an eye care professional within the past year . Reports that she has not had a formal hearing evaluation within the past year *Unable to fully assess hearing and vision during telephone visit type  Cognitive Function: 6CIT Screen 02/18/2020  What Year? 0 points  What month? 0 points  What time? 0 points  Count back from 20 0 points  Months in reverse 0 points  Repeat phrase 0 points  Total Score 0   (Normal:0-7, Significant for Dysfunction: >8)  Normal Cognitive Function Screening:  Yes   Immunization & Health Maintenance Record Immunization History  Administered Date(s) Administered  . Fluad Quad(high Dose 65+) 08/02/2019  . Influenza Split 10/24/2014  . Influenza,inj,Quad PF,6+ Mos 10/24/2014, 07/20/2016, 08/10/2018  . Influenza-Unspecified 10/05/2015  . Tdap 03/03/2015    Health Maintenance  Topic Date Due  . COVID-19 Vaccine (1) Never done  . COLONOSCOPY  05/30/2017  . PNA vac Low Risk Adult (1 of 2 - PCV13) 12/11/2020 (Originally 11/08/2017)  . INFLUENZA VACCINE  05/17/2020  . TETANUS/TDAP  03/02/2025  . DEXA SCAN  Completed  . Hepatitis C Screening  Completed  . MAMMOGRAM  Discontinued       Assessment  This is a routine wellness examination for [name] Riley.  Health Maintenance: Due or Overdue Health Maintenance Due  Topic Date Due  . COVID-19 Vaccine (1) Never done  . COLONOSCOPY  05/30/2017    [name] Riley does not need a referral for Community Assistance: Care Management:   no Social Work:    no Prescription Assistance:  no Nutrition/Diabetes Education:  no   Plan:  Personalized Goals Goals Addressed            This Visit's Progress   . DIET - EAT MORE FRUITS AND VEGETABLES      . Exercise 3x per week (30 min per time)        Personalized Health Maintenance & Screening Recommendations  Colorectal cancer screening  Lung Cancer Screening Recommended: no (Low Dose CT Chest recommended if Age 32-80 years, 30 pack-year currently smoking OR have quit w/in past 15 years) Hepatitis C Screening recommended: no  Completed HIV Screening recommended: no  Advanced Directives: Written information was not prepared per patient's request.  Referrals & Orders No orders of the defined types were placed in this encounter.   Follow-up Plan . Follow-up with [name], MD as planned . Schedule for a follow up colonoscopy at the end of this year per GI    I have personally reviewed and noted the following in the patient's chart:    . Medical and social history . Use of alcohol, tobacco or illicit drugs  . Current medications and supplements . Functional ability and status . Nutritional status . Physical activity . Advanced directives . List of other physicians . Hospitalizations, surgeries, and ER visits in previous 12 months . Vitals . Screenings to include cognitive, depression, and falls . Referrals and appointments  In addition, I have reviewed and discussed with [name] Riley certain preventive protocols, quality metrics, and best practice recommendations. A written personalized care plan for preventive services as well as general preventive health recommendations is available and can be mailed to the patient at her request.      Rolena Infante LPN 04/24/9093

## 2020-02-18 NOTE — Patient Instructions (Signed)
[name] Riley , Thank you for taking time to come for your Medicare Wellness Visit. I appreciate your ongoing commitment to your health goals. Please review the following plan we discussed and let me know if I can assist you in the future.   These are the goals we discussed: Goals    . DIET - EAT MORE FRUITS AND VEGETABLES    . Exercise 3x per week (30 min per time)       This is a list of the screening recommended for you and due dates:  Health Maintenance  Topic Date Due  . COVID-19 Vaccine (1) Never done  . Colon Cancer Screening  05/30/2017  . Pneumonia vaccines (1 of 2 - PCV13) 12/11/2020*  . Flu Shot  05/17/2020  . Tetanus Vaccine  03/02/2025  . DEXA scan (bone density measurement)  Completed  .  Hepatitis C: One time screening is recommended by Center for Disease Control  (CDC) for  adults born from [birth year] through 1963.   Completed  . Mammogram  Discontinued  *Topic was postponed. The date shown is not the original due date.     Heart-Healthy Eating Plan Many factors influence your heart (coronary) health, including eating and exercise habits. Coronary risk increases with abnormal blood fat (lipid) levels. Heart-healthy meal planning includes limiting unhealthy fats, increasing healthy fats, and making other diet and lifestyle changes. What is my plan? Your health care provider may recommend that you:  Limit your fat intake to _________% or less of your total calories each day.  Limit your saturated fat intake to _________% or less of your total calories each day.  Limit the amount of cholesterol in your diet to less than _________ mg per day. What are tips for following this plan? Cooking Cook foods using methods other than frying. Baking, boiling, grilling, and broiling are all good options. Other ways to reduce fat include:  Removing the skin from poultry.  Removing all visible fats from meats.  Steaming vegetables in water or broth. Meal planning   At meals,  imagine dividing your plate into fourths: ? Fill one-half of your plate with vegetables and green salads. ? Fill one-fourth of your plate with whole grains. ? Fill one-fourth of your plate with lean protein foods.  Eat 4-5 servings of vegetables per day. One serving equals 1 cup raw or cooked vegetable, or 2 cups raw leafy greens.  Eat 4-5 servings of fruit per day. One serving equals 1 medium whole fruit,  cup dried fruit,  cup fresh, frozen, or canned fruit, or  cup 100% fruit juice.  Eat more foods that contain soluble fiber. Examples include apples, broccoli, carrots, beans, peas, and barley. Aim to get 25-30 g of fiber per day.  Increase your consumption of legumes, nuts, and seeds to 4-5 servings per week. One serving of dried beans or legumes equals  cup cooked, 1 serving of nuts is  cup, and 1 serving of seeds equals 1 tablespoon. Fats  Choose healthy fats more often. Choose monounsaturated and polyunsaturated fats, such as olive and canola oils, flaxseeds, walnuts, almonds, and seeds.  Eat more omega-3 fats. Choose salmon, mackerel, sardines, tuna, flaxseed oil, and ground flaxseeds. Aim to eat fish at least 2 times each week.  Check food labels carefully to identify foods with trans fats or high amounts of saturated fat.  Limit saturated fats. These are found in animal products, such as meats, butter, and cream. Plant sources of saturated fats include palm oil, palm  kernel oil, and coconut oil.  Avoid foods with partially hydrogenated oils in them. These contain trans fats. Examples are stick margarine, some tub margarines, cookies, crackers, and other baked goods.  Avoid fried foods. General information  Eat more home-cooked food and less restaurant, buffet, and fast food.  Limit or avoid alcohol.  Limit foods that are high in starch and sugar.  Lose weight if you are overweight. Losing just 5-10% of your body weight can help your overall health and prevent diseases  such as diabetes and heart disease.  Monitor your salt (sodium) intake, especially if you have high blood pressure. Talk with your health care provider about your sodium intake.  Try to incorporate more vegetarian meals weekly. What foods can I eat? Fruits All fresh, canned (in natural juice), or frozen fruits. Vegetables Fresh or frozen vegetables (raw, steamed, roasted, or grilled). Green salads. Grains Most grains. Choose whole wheat and whole grains most of the time. Rice and pasta, including brown rice and pastas made with whole wheat. Meats and other proteins Lean, well-trimmed beef, veal, pork, and lamb. Chicken and [place] without skin. All fish and shellfish. Wild duck, rabbit, pheasant, and venison. Egg whites or low-cholesterol egg substitutes. Dried beans, peas, lentils, and tofu. Seeds and most nuts. Dairy Low-fat or nonfat cheeses, including ricotta and mozzarella. Skim or 1% milk (liquid, powdered, or evaporated). Buttermilk made with low-fat milk. Nonfat or low-fat yogurt. Fats and oils Non-hydrogenated (trans-free) margarines. Vegetable oils, including soybean, sesame, sunflower, olive, peanut, safflower, corn, canola, and cottonseed. Salad dressings or mayonnaise made with a vegetable oil. Beverages Water (mineral or sparkling). Coffee and tea. Diet carbonated beverages. Sweets and desserts Sherbet, gelatin, and fruit ice. Small amounts of dark chocolate. Limit all sweets and desserts. Seasonings and condiments All seasonings and condiments. The items listed above may not be a complete list of foods and beverages you can eat. Contact a dietitian for more options. What foods are not recommended? Fruits Canned fruit in heavy syrup. Fruit in cream or butter sauce. Fried fruit. Limit coconut. Vegetables Vegetables cooked in cheese, cream, or butter sauce. Fried vegetables. Grains Breads made with saturated or trans fats, oils, or whole milk. Croissants. Sweet rolls.  Donuts. High-fat crackers, such as cheese crackers. Meats and other proteins Fatty meats, such as hot dogs, ribs, sausage, bacon, rib-eye roast or steak. High-fat deli meats, such as salami and bologna. Caviar. Domestic duck and goose. Organ meats, such as liver. Dairy Cream, sour cream, cream cheese, and creamed cottage cheese. Whole milk cheeses. Whole or 2% milk (liquid, evaporated, or condensed). Whole buttermilk. Cream sauce or high-fat cheese sauce. Whole-milk yogurt. Fats and oils Meat fat, or shortening. Cocoa butter, hydrogenated oils, palm oil, coconut oil, palm kernel oil. Solid fats and shortenings, including bacon fat, salt pork, lard, and butter. Nondairy cream substitutes. Salad dressings with cheese or sour cream. Beverages Regular sodas and any drinks with added sugar. Sweets and desserts Frosting. Pudding. Cookies. Cakes. Pies. Milk chocolate or white chocolate. Buttered syrups. Full-fat ice cream or ice cream drinks. The items listed above may not be a complete list of foods and beverages to avoid. Contact a dietitian for more information. Summary  Heart-healthy meal planning includes limiting unhealthy fats, increasing healthy fats, and making other diet and lifestyle changes.  Lose weight if you are overweight. Losing just 5-10% of your body weight can help your overall health and prevent diseases such as diabetes and heart disease.  Focus on eating a balance of foods,  including fruits and vegetables, low-fat or nonfat dairy, lean protein, nuts and legumes, whole grains, and heart-healthy oils and fats. This information is not intended to replace advice given to you by your health care provider. Make sure you discuss any questions you have with your health care provider. Document Revised: 11/10/2017 Document Reviewed: 11/10/2017 Elsevier Patient Education  2020 [place] Directive  Advance directives are legal documents that let you make choices ahead of  time about your health care and medical treatment in case you become unable to communicate for yourself. Advance directives are a way for you to make known your wishes to family, friends, and health care providers. This can let others know about your end-of-life care if you become unable to communicate. Discussing and writing advance directives should happen over time rather than all at once. Advance directives can be changed depending on your situation and what you want, even after you have signed the advance directives. There are different types of advance directives, such as:  Medical power of attorney.  Living will.  Do not resuscitate (DNR) or do not attempt resuscitation (DNAR) order. Health care proxy and medical power of attorney A health care proxy is also called a health care agent. This is a person who is appointed to make medical decisions for you in cases where you are unable to make the decisions yourself. Generally, people choose someone they know well and trust to represent their preferences. Make sure to ask this person for an agreement to act as your proxy. A proxy may have to exercise judgment in the event of a medical decision for which your wishes are not known. A medical power of attorney is a legal document that names your health care proxy. Depending on the laws in your state, after the document is written, it may also need to be:  Signed.  Notarized.  Dated.  Copied.  Witnessed.  Incorporated into your medical record. You may also want to appoint someone to manage your money in a situation in which you are unable to do so. This is called a durable power of attorney for finances. It is a separate legal document from the durable power of attorney for health care. You may choose the same person or someone different from your health care proxy to act as your agent in money matters. If you do not appoint a proxy, or if there is a concern that the proxy is not acting in  your best interests, a court may appoint a guardian to act on your behalf. Living will A living will is a set of instructions that state your wishes about medical care when you cannot express them yourself. Health care providers should keep a copy of your living will in your medical record. You may want to give a copy to family members or friends. To alert caregivers in case of an emergency, you can place a card in your wallet to let them know that you have a living will and where they can find it. A living will is used if you become:  Terminally ill.  Disabled.  Unable to communicate or make decisions. Items to consider in your living will include:  To use or not to use life-support equipment, such as dialysis machines and breathing machines (ventilators).  A DNR or DNAR order. This tells health care providers not to use cardiopulmonary resuscitation (CPR) if breathing or heartbeat stops.  To use or not to use tube feeding.  To be  given or not to be given food and fluids.  Comfort (palliative) care when the goal becomes comfort rather than a cure.  Donation of organs and tissues. A living will does not give instructions for distributing your money and property if you should pass away. DNR or DNAR A DNR or DNAR order is a request not to have CPR in the event that your heart stops beating or you stop breathing. If a DNR or DNAR order has not been made and shared, a health care provider will try to help any patient whose heart has stopped or who has stopped breathing. If you plan to have surgery, talk with your health care provider about how your DNR or DNAR order will be followed if problems occur. What if I do not have an advance directive? If you do not have an advance directive, some states assign family decision makers to act on your behalf based on how closely you are related to them. Each state has its own laws about advance directives. You may want to check with your health care  provider, attorney, or state representative about the laws in your state. Summary  Advance directives are the legal documents that allow you to make choices ahead of time about your health care and medical treatment in case you become unable to tell others about your care.  The process of discussing and writing advance directives should happen over time. You can change the advance directives, even after you have signed them.  Advance directives include DNR or DNAR orders, living wills, and designating an agent as your medical power of attorney. This information is not intended to replace advice given to you by your health care provider. Make sure you discuss any questions you have with your health care provider. Document Revised: 05/02/2019 Document Reviewed: 05/02/2019 Elsevier Patient Education  [place].

## 2020-02-20 NOTE — Progress Notes (Signed)
Patient Care Team: [name], MD as PCP - General (Family Medicine) Nicholas Lose, MD as Consulting Physician (Hematology and Oncology)  DIAGNOSIS:    ICD-10-CM   1. Metastatic breast cancer ([place])  C50.919   2. Malignant neoplasm of upper-outer quadrant of left breast in female, estrogen receptor positive ([place])  C50.412    Z17.0     SUMMARY OF ONCOLOGIC HISTORY: Oncology History  Breast cancer of upper-outer quadrant of left female breast ([place])  11/14/2011 Surgery   Bilateral mastectomy, prophylactic on the right, left breast IDC 3/18 lymph nodes positive with extracapsular extension ER 89%, PR 81%, HER-2 negative, Ki-67 79% T2 N1 A. stage IIB   12/13/2011 - 06/28/2012 Chemotherapy   4 cycles of FEC followed by 4 cycles of Taxotere   07/17/2012 - 08/22/2012 Radiation Therapy   Adjuvant radiation therapy   08/22/2012 - 03/16/2016 Anti-estrogen oral therapy   Arimidex 1 mg daily   03/16/2016 Relapse/Recurrence   Subcutaneous nodule excision left chest: Infiltrating carcinoma breast primary, ER positive, PR negative   03/29/2016 Imaging   CT CAP and bone scan: Lytic lesions T8 vertebral, T1 posterior element, subcutaneous nodule left lateral chest wall, nonspecific lung nodules; Bone scan: Mets to kull, left humerus, left eighth rib, T7/T8, sternum, left acetabulum   04/28/2016 - 06/17/2017 Chemotherapy   Herceptin, lapatinib, Faslodex, Zometa every 4 weeks, lapatinib discontinued in September 2018 due to elevation of LFTs   06/22/2017 Relapse/Recurrence   Surgical excision:Soft tissue mass left lateral chest wall primary breast cancer, soft tissue mass left medial chest wall breast cancer, tumor is within the dermis extending to the subcutaneous adipose tissue and involves portions of skeletal muscle   06/22/2017 Cancer Staging   Staging form: Breast, AJCC 7th Edition - Pathologic stage from 06/22/2017: Stage IV (TX, NX, M1) - Signed by Nicholas Lose, MD on 12/27/2019   08/2017 -  02/16/2018 Chemotherapy   Faslodex with Herceptin and Perjeta along with Zometa every 4 weeks    03/02/2018 - 05/25/2018 Chemotherapy   [place]    05/11/2018 Imaging   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 Associated vasogenic edema within the underlying right cerebral hemisphere without significant midline shift. Signal abnormality throughout the visualized bone marrow, compatible with osseous metastatic disease.      06/04/2018 Imaging   CT CAP: Right lower lobe lung nodule 7 mm (was 5 mm); multiple bone metastases throughout the spine and ribs sternum scapula and humerus, slightly increased lower thoracic mets, right renal lesion 2.5 cm (was 1.2 cm) right femur met increased from 2.1 cm to 2.9 cm   06/15/2018 -  Anti-estrogen oral therapy   Abemaciclib, Herceptin, letrozole, Xgeva   06/07/2019 Imaging   Progression of brain metastases,2 discrete dural-based lesions involving the posterior right frontal lobe. The more anterior lesion now measures 16.5 x 17.5 x 14 mm. The posterior lesion now measures 9.5 x 13 x 20 mm.  Vasogenic edema of the frontal and parietal lobes   06/21/2019 - 06/27/2019 Radiation Therapy   SRS to the brain   Metastatic breast cancer ([place])  06/29/2017 Initial Diagnosis   Metastatic breast cancer ([place])   06/15/2018 -  Chemotherapy   The patient had trastuzumab (HERCEPTIN) 546 mg in sodium chloride 0.9 % 250 mL chemo infusion, 8 mg/kg = 546 mg, Intravenous,  Once, 13 of 13 cycles Administration: 546 mg (06/15/2018), 399 mg (09/07/2018), 399 mg (07/13/2018), 399 mg (10/12/2018), 399 mg (11/02/2018), 399 mg (11/30/2018), 399 mg (01/24/2019), 399 mg (12/28/2018), 399 mg (  02/22/2019), 399 mg (03/22/2019), 399 mg (04/17/2019), 399 mg (08/10/2018), 399 mg (05/17/2019) trastuzumab-dkst (OGIVRI) 399 mg in sodium chloride 0.9 % 250 mL chemo infusion, 6 mg/kg = 399 mg (100 % of original dose 6 mg/kg), Intravenous,  Once, 9 of 17 cycles Dose modification: 6 mg/kg  (original dose 6 mg/kg, Cycle 14, Reason: Other (see comments), Comment: Biosimilar Conversion) Administration: 399 mg (06/14/2019), 399 mg (07/12/2019), 399 mg (08/09/2019), 399 mg (09/06/2019), 399 mg (10/04/2019), 399 mg (11/01/2019), 399 mg (11/29/2019), 399 mg (12/27/2019), 399 mg (01/24/2020)  for chemotherapy treatment.      CHIEF COMPLIANT: Follow-up on Herceptin with abemaciclib,letrozole,andXgeva  INTERVAL HISTORY: [name] Riley is a 69 y.o. with above-mentioned history of metastatic breast cancer currently on abemaciclib with letrozole along with Herceptin and Xgeva.She presents to the clinic today for treatment.  ALLERGIES:  is allergic to cantaloupe extract allergy skin test; contrast media [iodinated diagnostic agents]; pravastatin; and zosyn [piperacillin sod-tazobactam so].  MEDICATIONS:  Current Outpatient Medications  Medication Sig Dispense Refill  . acetaminophen (TYLENOL) 500 MG tablet Take 1,000 mg by mouth every 6 (six) hours as needed for moderate pain.    . cholecalciferol (VITAMIN D) 1000 units tablet Take 1 tablet (1,000 Units total) by mouth daily.    . diphenhydrAMINE (BENADRYL) 25 MG tablet Take 50 mg by mouth at bedtime as needed for sleep.    Marland Kitchen letrozole (FEMARA) 2.5 MG tablet TAKE ONE (1) TABLET EACH DAY 90 tablet 3  . levETIRAcetam (KEPPRA) 500 MG tablet TAKE ONE TABLET BY MOUTH TWICE DAILY 60 tablet 2  . levothyroxine (SYNTHROID) 100 MCG tablet TAKE ONE TABLET EACH MORNING BEFORE BREAKFAST 90 tablet 3  . lidocaine-prilocaine (EMLA) cream APPLY TOPICALLY AS NEEDED FOR PORT ACCESS 30 g 3  . metoprolol succinate (TOPROL-XL) 100 MG 24 hr tablet Take 1 tablet (100 mg total) by mouth daily. 90 tablet 3  . Multiple Vitamins-Minerals (MULTIVITAMIN WITH MINERALS) tablet Take 1 tablet by mouth daily.      . ondansetron (ZOFRAN-ODT) 4 MG disintegrating tablet TAKE 1 TABLET EVERY 6 HOURS AS NEEDED FOR NAUSEA AND VOMITING 30 tablet 3  . predniSONE (DELTASONE) 50 MG  tablet For contrast dye allergy (Patient not taking: Reported on 01/27/2020) 3 tablet 0  . rosuvastatin (CRESTOR) 10 MG tablet Take 10 mg by mouth daily.    . valsartan (DIOVAN) 80 MG tablet Take 1 tablet (80 mg total) by mouth daily. for blood pressure 90 tablet 1  . VERZENIO 150 MG tablet TAKE 1 TABLET (150 MG TOTAL) BY MOUTH 2 (TWO) TIMES DAILY. 56 tablet 0   No current facility-administered medications for this visit.   Facility-Administered Medications Ordered in Other Visits  Medication Dose Route Frequency Provider Last Rate Last Admin  . 0.9 %  sodium chloride infusion   Intravenous Continuous Donnie Mesa, MD      . acetaminophen (TYLENOL) tablet 975 mg  975 mg Oral Q6H Donnie Mesa, MD      . enoxaparin (LOVENOX) injection 40 mg  40 mg Subcutaneous Q24H Donnie Mesa, MD      . morphine 4 MG/ML injection 2-4 mg  2-4 mg Intravenous Q2H PRN Donnie Mesa, MD      . ondansetron (ZOFRAN-ODT) disintegrating tablet 4 mg  4 mg Oral Q6H PRN Donnie Mesa, MD       Or  . ondansetron (ZOFRAN) 4 mg in sodium chloride 0.9 % 50 mL IVPB  4 mg Intravenous Q6H PRN Donnie Mesa, MD      .  oxyCODONE (Oxy IR/ROXICODONE) immediate release tablet 5-10 mg  5-10 mg Oral Q4H PRN Donnie Mesa, MD      . piperacillin-tazobactam (ZOSYN) 3.375 g in dextrose 5 % 50 mL IVPB  3.375 g Intravenous Q8H Donnie Mesa, MD      . prochlorperazine (COMPAZINE) tablet 10 mg  10 mg Oral Q6H PRN Donnie Mesa, MD       Or  . prochlorperazine (COMPAZINE) injection 5-10 mg  5-10 mg Intravenous Q6H PRN Donnie Mesa, MD        PHYSICAL EXAMINATION: ECOG PERFORMANCE STATUS: 1 - Symptomatic but completely ambulatory  Vitals:   02/21/20 1011  BP: (!) 163/88  Pulse: 78  Resp: 18  Temp: 98.2 F (36.8 C)  SpO2: 100%   Filed Weights   02/21/20 1011  Weight: 139 lb 3.2 oz (63.1 kg)    LABORATORY DATA:  I have reviewed the data as listed CMP Latest Ref Rng & Units 02/21/2020 01/23/2020 12/27/2019  Glucose 70  - 99 mg/dL 78 81 100(H)  BUN 8 - 23 mg/dL 12 15 13   Creatinine 0.44 - 1.00 mg/dL 0.84 0.91 0.88  Sodium 135 - 145 mmol/L 140 140 141  Potassium 3.5 - 5.1 mmol/L 3.3(L) 4.0 3.5  Chloride 98 - 111 mmol/L 103 101 105  CO2 22 - 32 mmol/L 28 21 29   Calcium 8.9 - 10.3 mg/dL 9.6 9.5 9.4  Total Protein 6.5 - 8.1 g/dL 7.2 - 6.7  Total Bilirubin 0.3 - 1.2 mg/dL 0.6 - 0.5  Alkaline Phos 38 - 126 U/L 242(H) - 186(H)  AST 15 - 41 U/L 89(H) - 45(H)  ALT 0 - 44 U/L 70(H) - 26    Lab Results  Component Value Date   WBC 2.3 (L) 02/21/2020   HGB 10.1 (L) 02/21/2020   HCT 30.1 (L) 02/21/2020   MCV 106.7 (H) 02/21/2020   PLT 52 (L) 02/21/2020   NEUTROABS 0.8 (L) 02/21/2020    ASSESSMENT & PLAN:  Breast cancer of upper-outer quadrant of left female breast ([place]) Current treatment: Abemaciclibwith letrozole along with Herceptin(to be given every 4 weeks)and Xgeva  CT CAP 01/02/2020: Widespread sclerotic bone metastases.  Unchanged from prior exam.  No other soft tissue metastases. 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  Toxicities: Cytopenias Thrombocytopenia: Monitoring closely.  Today's platelet count 52 Anemia: Today's hemoglobin is 10.1 Leukopenia: Today's ANC 0.8: I recommended reducing the dosage of abemaciclib It is fine to proceed with Herceptin treatment. Patient is coming in monthly for Herceptin treatments.  Neck scans will be done in August 2021. Return to clinic in 1 month with labs and follow-up.    No orders of the defined types were placed in this encounter.  The patient has a good understanding of the overall plan. she agrees with it. she will call with any problems that may develop before the next visit here.  Total time spent: 30 mins including face to face time and time spent for planning, charting and coordination of care  Nicholas Lose, MD 02/21/2020  I, Cloyde Reams Dorshimer, am acting as scribe for Dr. Nicholas Lose.  I have  reviewed the above documentation for accuracy and completeness, and I agree with the above.

## 2020-02-21 ENCOUNTER — Inpatient Hospital Stay (HOSPITAL_BASED_OUTPATIENT_CLINIC_OR_DEPARTMENT_OTHER): Payer: Medicare Other | Admitting: Hematology and Oncology

## 2020-02-21 ENCOUNTER — Inpatient Hospital Stay: Payer: Medicare Other

## 2020-02-21 ENCOUNTER — Other Ambulatory Visit: Payer: Self-pay

## 2020-02-21 ENCOUNTER — Inpatient Hospital Stay: Payer: Medicare Other | Attending: Hematology and Oncology

## 2020-02-21 VITALS — BP 163/88 | HR 78 | Temp 98.2°F | Resp 18 | Ht 68.0 in | Wt 139.2 lb

## 2020-02-21 DIAGNOSIS — C50919 Malignant neoplasm of unspecified site of unspecified female breast: Secondary | ICD-10-CM

## 2020-02-21 DIAGNOSIS — Z79899 Other long term (current) drug therapy: Secondary | ICD-10-CM | POA: Insufficient documentation

## 2020-02-21 DIAGNOSIS — C7931 Secondary malignant neoplasm of brain: Secondary | ICD-10-CM | POA: Insufficient documentation

## 2020-02-21 DIAGNOSIS — R918 Other nonspecific abnormal finding of lung field: Secondary | ICD-10-CM | POA: Insufficient documentation

## 2020-02-21 DIAGNOSIS — Z5112 Encounter for antineoplastic immunotherapy: Secondary | ICD-10-CM | POA: Insufficient documentation

## 2020-02-21 DIAGNOSIS — Z170 Estrogen receptor positive status [ER+]: Secondary | ICD-10-CM | POA: Diagnosis not present

## 2020-02-21 DIAGNOSIS — Z923 Personal history of irradiation: Secondary | ICD-10-CM | POA: Insufficient documentation

## 2020-02-21 DIAGNOSIS — Z79811 Long term (current) use of aromatase inhibitors: Secondary | ICD-10-CM | POA: Insufficient documentation

## 2020-02-21 DIAGNOSIS — C50412 Malignant neoplasm of upper-outer quadrant of left female breast: Secondary | ICD-10-CM

## 2020-02-21 DIAGNOSIS — Z9221 Personal history of antineoplastic chemotherapy: Secondary | ICD-10-CM | POA: Insufficient documentation

## 2020-02-21 DIAGNOSIS — D61818 Other pancytopenia: Secondary | ICD-10-CM | POA: Diagnosis not present

## 2020-02-21 LAB — CMP (CANCER CENTER ONLY)
ALT: 70 U/L — ABNORMAL HIGH (ref 0–44)
AST: 89 U/L — ABNORMAL HIGH (ref 15–41)
Albumin: 3.9 g/dL (ref 3.5–5.0)
Alkaline Phosphatase: 242 U/L — ABNORMAL HIGH (ref 38–126)
Anion gap: 9 (ref 5–15)
BUN: 12 mg/dL (ref 8–23)
CO2: 28 mmol/L (ref 22–32)
Calcium: 9.6 mg/dL (ref 8.9–10.3)
Chloride: 103 mmol/L (ref 98–111)
Creatinine: 0.84 mg/dL (ref 0.44–1.00)
GFR, Est AFR Am: >60 mL/min (ref >60)
GFR, Estimated: >60 mL/min (ref >60)
Glucose, Bld: 78 mg/dL (ref 70–99)
Potassium: 3.3 mmol/L — ABNORMAL LOW (ref 3.5–5.1)
Sodium: 140 mmol/L (ref 135–145)
Total Bilirubin: 0.6 mg/dL (ref 0.3–1.2)
Total Protein: 7.2 g/dL (ref 6.5–8.1)

## 2020-02-21 LAB — CBC WITH DIFFERENTIAL (CANCER CENTER ONLY)
Abs Immature Granulocytes: 0.01 10*3/uL (ref 0.00–0.07)
Basophils Absolute: 0 10*3/uL (ref 0.0–0.1)
Basophils Relative: 2 %
Eosinophils Absolute: 0.1 10*3/uL (ref 0.0–0.5)
Eosinophils Relative: 3 %
HCT: 30.1 % — ABNORMAL LOW (ref 36.0–46.0)
Hemoglobin: 10.1 g/dL — ABNORMAL LOW (ref 12.0–15.0)
Immature Granulocytes: 0 %
Lymphocytes Relative: 51 %
Lymphs Abs: 1.2 10*3/uL (ref 0.7–4.0)
MCH: 35.8 pg — ABNORMAL HIGH (ref 26.0–34.0)
MCHC: 33.6 g/dL (ref 30.0–36.0)
MCV: 106.7 fL — ABNORMAL HIGH (ref 80.0–100.0)
Monocytes Absolute: 0.2 10*3/uL (ref 0.1–1.0)
Monocytes Relative: 9 %
Neutro Abs: 0.8 10*3/uL — ABNORMAL LOW (ref 1.7–7.7)
Neutrophils Relative %: 35 %
Platelet Count: 52 10*3/uL — ABNORMAL LOW (ref 150–400)
RBC: 2.82 MIL/uL — ABNORMAL LOW (ref 3.87–5.11)
RDW: 13.8 % (ref 11.5–15.5)
WBC Count: 2.3 10*3/uL — ABNORMAL LOW (ref 4.0–10.5)
nRBC: 0 % (ref 0.0–0.2)

## 2020-02-21 MED ORDER — HEPARIN SOD (PORK) LOCK FLUSH 100 UNIT/ML IV SOLN
500.0000 [IU] | Freq: Once | INTRAVENOUS | Status: AC | PRN
  Administered 2020-02-21: 500 [IU]
  Filled 2020-02-21: qty 5

## 2020-02-21 MED ORDER — ACETAMINOPHEN 325 MG PO TABS
ORAL_TABLET | ORAL | Status: AC
  Filled 2020-02-21: qty 2

## 2020-02-21 MED ORDER — ABEMACICLIB 100 MG PO TABS: 100.0000 mg | ORAL_TABLET | Freq: Two times a day (BID) | ORAL | 3 refills | Status: DC

## 2020-02-21 MED ORDER — ACETAMINOPHEN 325 MG PO TABS
650.0000 mg | ORAL_TABLET | Freq: Once | ORAL | Status: AC
  Administered 2020-02-21: 650 mg via ORAL

## 2020-02-21 MED ORDER — DIPHENHYDRAMINE HCL 25 MG PO CAPS
ORAL_CAPSULE | ORAL | Status: AC
  Filled 2020-02-21: qty 2

## 2020-02-21 MED ORDER — DIPHENHYDRAMINE HCL 25 MG PO CAPS
50.0000 mg | ORAL_CAPSULE | Freq: Once | ORAL | Status: AC
  Administered 2020-02-21: 50 mg via ORAL

## 2020-02-21 MED ORDER — SODIUM CHLORIDE 0.9 % IV SOLN
Freq: Once | INTRAVENOUS | Status: AC
  Filled 2020-02-21: qty 250

## 2020-02-21 MED ORDER — SODIUM CHLORIDE 0.9% FLUSH
10.0000 mL | INTRAVENOUS | Status: DC | PRN
  Administered 2020-02-21: 10 mL
  Filled 2020-02-21: qty 10

## 2020-02-21 MED ORDER — TRASTUZUMAB-DKST CHEMO 150 MG IV SOLR
6.0000 mg/kg | Freq: Once | INTRAVENOUS | Status: AC
  Administered 2020-02-21: 399 mg via INTRAVENOUS
  Filled 2020-02-21: qty 19

## 2020-02-21 NOTE — Assessment & Plan Note (Signed)
Current treatment: Abemaciclibwith letrozole along with Herceptin(to be given every 4 weeks)and Xgeva  CT CAP 01/02/2020: Widespread sclerotic bone metastases.  Unchanged from prior exam.  No other soft tissue metastases. 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  Toxicities: Cytopenias Thrombocytopenia: Monitoring closely. Anemia: Today's hemoglobin is   Leukopenia: Today's ANC    Patient is coming in monthly for Herceptin treatments. I will see her with every other treatment of Herceptin.

## 2020-02-21 NOTE — Patient Instructions (Signed)
[facility] °Discharge Instructions for Patients Receiving Chemotherapy ° °Today you received the following chemotherapy agents Trastuzumab ° °To help prevent nausea and vomiting after your treatment, we encourage you to take your nausea medication as directed. °  °If you develop nausea and vomiting that is not controlled by your nausea medication, call the clinic.  ° °BELOW ARE SYMPTOMS THAT SHOULD BE REPORTED IMMEDIATELY: °· *FEVER GREATER THAN 100.5 F °· *CHILLS WITH OR WITHOUT FEVER °· NAUSEA AND VOMITING THAT IS NOT CONTROLLED WITH YOUR NAUSEA MEDICATION °· *UNUSUAL SHORTNESS OF BREATH °· *UNUSUAL BRUISING OR BLEEDING °· TENDERNESS IN MOUTH AND THROAT WITH OR WITHOUT PRESENCE OF ULCERS °· *URINARY PROBLEMS °· *BOWEL PROBLEMS °· UNUSUAL RASH °Items with * indicate a potential emergency and should be followed up as soon as possible. ° °Feel free to call the clinic should you have any questions or concerns. The clinic phone number is [phone]. ° °Please show the CHEMO ALERT CARD at check-in to the Emergency Department and triage nurse. ° ° °

## 2020-02-28 ENCOUNTER — Ambulatory Visit: Payer: Medicare Other

## 2020-02-28 ENCOUNTER — Other Ambulatory Visit: Payer: Self-pay | Admitting: *Deleted

## 2020-03-06 ENCOUNTER — Ambulatory Visit: Payer: Medicare Other

## 2020-03-06 ENCOUNTER — Ambulatory Visit: Payer: Medicare Other | Admitting: Hematology and Oncology

## 2020-03-13 NOTE — Progress Notes (Signed)
Pharmacist Chemotherapy Monitoring - Follow Up Assessment    I verify that I have reviewed each item in the below checklist:  . Regimen for the patient is scheduled for the appropriate day and plan matches scheduled date. Marland Kitchen Appropriate non-routine labs are ordered dependent on drug ordered. . If applicable, additional medications reviewed and ordered per protocol based on lifetime cumulative doses and/or treatment regimen.   Plan for follow-up and/or issues identified: No . I-vent associated with next due treatment: No . MD and/or nursing notified: No  [name] Riley 03/13/2020 11:09 AM

## 2020-03-20 ENCOUNTER — Inpatient Hospital Stay (HOSPITAL_BASED_OUTPATIENT_CLINIC_OR_DEPARTMENT_OTHER): Payer: Medicare Other | Admitting: Adult Health

## 2020-03-20 ENCOUNTER — Inpatient Hospital Stay: Payer: Medicare Other

## 2020-03-20 ENCOUNTER — Other Ambulatory Visit: Payer: Self-pay | Admitting: *Deleted

## 2020-03-20 ENCOUNTER — Inpatient Hospital Stay: Payer: Medicare Other | Attending: Hematology and Oncology

## 2020-03-20 ENCOUNTER — Ambulatory Visit: Payer: Medicare Other | Admitting: Hematology and Oncology

## 2020-03-20 ENCOUNTER — Other Ambulatory Visit: Payer: Medicare Other

## 2020-03-20 ENCOUNTER — Encounter: Payer: Self-pay | Admitting: Adult Health

## 2020-03-20 ENCOUNTER — Ambulatory Visit: Payer: Medicare Other

## 2020-03-20 ENCOUNTER — Other Ambulatory Visit: Payer: Self-pay

## 2020-03-20 VITALS — BP 155/88 | HR 65 | Temp 98.5°F | Resp 17 | Ht 68.0 in | Wt 140.7 lb

## 2020-03-20 DIAGNOSIS — E785 Hyperlipidemia, unspecified: Secondary | ICD-10-CM | POA: Diagnosis not present

## 2020-03-20 DIAGNOSIS — C7931 Secondary malignant neoplasm of brain: Secondary | ICD-10-CM | POA: Insufficient documentation

## 2020-03-20 DIAGNOSIS — Z170 Estrogen receptor positive status [ER+]: Secondary | ICD-10-CM

## 2020-03-20 DIAGNOSIS — D61818 Other pancytopenia: Secondary | ICD-10-CM | POA: Insufficient documentation

## 2020-03-20 DIAGNOSIS — Z79899 Other long term (current) drug therapy: Secondary | ICD-10-CM | POA: Diagnosis not present

## 2020-03-20 DIAGNOSIS — Z923 Personal history of irradiation: Secondary | ICD-10-CM | POA: Diagnosis not present

## 2020-03-20 DIAGNOSIS — Z8041 Family history of malignant neoplasm of ovary: Secondary | ICD-10-CM | POA: Insufficient documentation

## 2020-03-20 DIAGNOSIS — M25551 Pain in right hip: Secondary | ICD-10-CM | POA: Insufficient documentation

## 2020-03-20 DIAGNOSIS — Z9221 Personal history of antineoplastic chemotherapy: Secondary | ICD-10-CM | POA: Diagnosis not present

## 2020-03-20 DIAGNOSIS — Z79811 Long term (current) use of aromatase inhibitors: Secondary | ICD-10-CM | POA: Diagnosis not present

## 2020-03-20 DIAGNOSIS — Z800 Family history of malignant neoplasm of digestive organs: Secondary | ICD-10-CM | POA: Insufficient documentation

## 2020-03-20 DIAGNOSIS — C50919 Malignant neoplasm of unspecified site of unspecified female breast: Secondary | ICD-10-CM

## 2020-03-20 DIAGNOSIS — Z801 Family history of malignant neoplasm of trachea, bronchus and lung: Secondary | ICD-10-CM | POA: Insufficient documentation

## 2020-03-20 DIAGNOSIS — C50412 Malignant neoplasm of upper-outer quadrant of left female breast: Secondary | ICD-10-CM | POA: Insufficient documentation

## 2020-03-20 DIAGNOSIS — R948 Abnormal results of function studies of other organs and systems: Secondary | ICD-10-CM | POA: Diagnosis not present

## 2020-03-20 DIAGNOSIS — Z9013 Acquired absence of bilateral breasts and nipples: Secondary | ICD-10-CM | POA: Insufficient documentation

## 2020-03-20 DIAGNOSIS — Z95828 Presence of other vascular implants and grafts: Secondary | ICD-10-CM

## 2020-03-20 DIAGNOSIS — Z803 Family history of malignant neoplasm of breast: Secondary | ICD-10-CM | POA: Insufficient documentation

## 2020-03-20 DIAGNOSIS — C7951 Secondary malignant neoplasm of bone: Secondary | ICD-10-CM

## 2020-03-20 DIAGNOSIS — Z5112 Encounter for antineoplastic immunotherapy: Secondary | ICD-10-CM | POA: Diagnosis present

## 2020-03-20 DIAGNOSIS — I10 Essential (primary) hypertension: Secondary | ICD-10-CM | POA: Diagnosis not present

## 2020-03-20 DIAGNOSIS — E039 Hypothyroidism, unspecified: Secondary | ICD-10-CM | POA: Insufficient documentation

## 2020-03-20 DIAGNOSIS — D696 Thrombocytopenia, unspecified: Secondary | ICD-10-CM | POA: Diagnosis not present

## 2020-03-20 LAB — CBC WITH DIFFERENTIAL (CANCER CENTER ONLY)
Abs Immature Granulocytes: 0 10*3/uL (ref 0.00–0.07)
Basophils Absolute: 0 10*3/uL (ref 0.0–0.1)
Basophils Relative: 2 %
Eosinophils Absolute: 0.1 10*3/uL (ref 0.0–0.5)
Eosinophils Relative: 3 %
HCT: 27.2 % — ABNORMAL LOW (ref 36.0–46.0)
Hemoglobin: 9.1 g/dL — ABNORMAL LOW (ref 12.0–15.0)
Immature Granulocytes: 0 %
Lymphocytes Relative: 52 %
Lymphs Abs: 1.2 10*3/uL (ref 0.7–4.0)
MCH: 36.1 pg — ABNORMAL HIGH (ref 26.0–34.0)
MCHC: 33.5 g/dL (ref 30.0–36.0)
MCV: 107.9 fL — ABNORMAL HIGH (ref 80.0–100.0)
Monocytes Absolute: 0.2 10*3/uL (ref 0.1–1.0)
Monocytes Relative: 9 %
Neutro Abs: 0.8 10*3/uL — ABNORMAL LOW (ref 1.7–7.7)
Neutrophils Relative %: 34 %
Platelet Count: 71 10*3/uL — ABNORMAL LOW (ref 150–400)
RBC: 2.52 MIL/uL — ABNORMAL LOW (ref 3.87–5.11)
RDW: 13.6 % (ref 11.5–15.5)
WBC Count: 2.2 10*3/uL — ABNORMAL LOW (ref 4.0–10.5)
nRBC: 0 % (ref 0.0–0.2)

## 2020-03-20 LAB — CMP (CANCER CENTER ONLY)
ALT: 43 U/L (ref 0–44)
AST: 59 U/L — ABNORMAL HIGH (ref 15–41)
Albumin: 3.7 g/dL (ref 3.5–5.0)
Alkaline Phosphatase: 272 U/L — ABNORMAL HIGH (ref 38–126)
Anion gap: 11 (ref 5–15)
BUN: 14 mg/dL (ref 8–23)
CO2: 26 mmol/L (ref 22–32)
Calcium: 9.8 mg/dL (ref 8.9–10.3)
Chloride: 103 mmol/L (ref 98–111)
Creatinine: 0.94 mg/dL (ref 0.44–1.00)
GFR, Est AFR Am: >60 mL/min (ref >60)
GFR, Estimated: >60 mL/min (ref >60)
Glucose, Bld: 104 mg/dL — ABNORMAL HIGH (ref 70–99)
Potassium: 3.6 mmol/L (ref 3.5–5.1)
Sodium: 140 mmol/L (ref 135–145)
Total Bilirubin: 0.6 mg/dL (ref 0.3–1.2)
Total Protein: 6.8 g/dL (ref 6.5–8.1)

## 2020-03-20 MED ORDER — HEPARIN SOD (PORK) LOCK FLUSH 100 UNIT/ML IV SOLN
500.0000 [IU] | Freq: Once | INTRAVENOUS | Status: AC | PRN
  Administered 2020-03-20: 500 [IU]
  Filled 2020-03-20: qty 5

## 2020-03-20 MED ORDER — DENOSUMAB 120 MG/1.7ML ~~LOC~~ SOLN
120.0000 mg | Freq: Once | SUBCUTANEOUS | Status: AC
  Administered 2020-03-20: 120 mg via SUBCUTANEOUS

## 2020-03-20 MED ORDER — TRASTUZUMAB-DKST CHEMO 150 MG IV SOLR
6.0000 mg/kg | Freq: Once | INTRAVENOUS | Status: AC
  Administered 2020-03-20: 399 mg via INTRAVENOUS
  Filled 2020-03-20: qty 19

## 2020-03-20 MED ORDER — ACETAMINOPHEN 325 MG PO TABS
650.0000 mg | ORAL_TABLET | Freq: Once | ORAL | Status: AC
  Administered 2020-03-20: 650 mg via ORAL

## 2020-03-20 MED ORDER — DENOSUMAB 120 MG/1.7ML ~~LOC~~ SOLN
SUBCUTANEOUS | Status: AC
  Filled 2020-03-20: qty 1.7

## 2020-03-20 MED ORDER — SODIUM CHLORIDE 0.9 % IV SOLN
Freq: Once | INTRAVENOUS | Status: AC
  Filled 2020-03-20: qty 250

## 2020-03-20 MED ORDER — DIPHENHYDRAMINE HCL 25 MG PO CAPS
ORAL_CAPSULE | ORAL | Status: AC
  Filled 2020-03-20: qty 2

## 2020-03-20 MED ORDER — SODIUM CHLORIDE 0.9% FLUSH
10.0000 mL | INTRAVENOUS | Status: DC | PRN
  Administered 2020-03-20: 10 mL
  Filled 2020-03-20: qty 10

## 2020-03-20 MED ORDER — SODIUM CHLORIDE 0.9% FLUSH
10.0000 mL | Freq: Once | INTRAVENOUS | Status: AC
  Administered 2020-03-20: 10 mL
  Filled 2020-03-20: qty 10

## 2020-03-20 MED ORDER — DIPHENHYDRAMINE HCL 25 MG PO CAPS
50.0000 mg | ORAL_CAPSULE | Freq: Once | ORAL | Status: AC
  Administered 2020-03-20: 50 mg via ORAL

## 2020-03-20 MED ORDER — ACETAMINOPHEN 325 MG PO TABS
ORAL_TABLET | ORAL | Status: AC
  Filled 2020-03-20: qty 2

## 2020-03-20 NOTE — Progress Notes (Signed)
[facility] Follow up:    [name] Fraise, [place] [address]   DIAGNOSIS: Cancer Staging Breast cancer of upper-outer quadrant of left female breast [facility]) Staging form: Breast, AJCC 7th Edition - Clinical: Stage IIB (T2, N1, cM0) - Unsigned - Pathologic: No stage assigned - Unsigned - Pathologic stage from 06/22/2017: Stage IV ([place], [place], M1) - Signed by Nicholas Lose, MD on 12/27/2019   SUMMARY OF ONCOLOGIC HISTORY: Oncology History  Breast cancer of upper-outer quadrant of left female breast ([employer])  11/14/2011 Surgery   Bilateral mastectomy, prophylactic on the right, left breast IDC 3/18 lymph nodes positive with extracapsular extension ER 89%, PR 81%, HER-2 negative, Ki-67 79% T2 N1 A. stage IIB   12/13/2011 - 06/28/2012 Chemotherapy   4 cycles of FEC followed by 4 cycles of Taxotere   07/17/2012 - 08/22/2012 Radiation Therapy   Adjuvant radiation therapy   08/22/2012 - 03/16/2016 Anti-estrogen oral therapy   Arimidex 1 mg daily   03/16/2016 Relapse/Recurrence   Subcutaneous nodule excision left chest: Infiltrating carcinoma breast primary, ER positive, PR negative   03/29/2016 Imaging   CT CAP and bone scan: Lytic lesions T8 vertebral, T1 posterior element, subcutaneous nodule left lateral chest wall, nonspecific lung nodules; Bone scan: Mets to kull, left humerus, left eighth rib, T7/T8, sternum, left acetabulum   04/28/2016 - 06/17/2017 Chemotherapy   Herceptin, lapatinib, Faslodex, Zometa every 4 weeks, lapatinib discontinued in September 2018 due to elevation of LFTs   06/22/2017 Relapse/Recurrence   Surgical excision:Soft tissue mass left lateral chest wall primary breast cancer, soft tissue mass left medial chest wall breast cancer, tumor is within the dermis extending to the subcutaneous adipose tissue and involves portions of skeletal muscle   06/22/2017 Cancer Staging   Staging form: Breast, AJCC 7th Edition - Pathologic stage from 06/22/2017:  Stage IV ([place], NX, M1) - Signed by Nicholas Lose, MD on 12/27/2019   08/2017 - 02/16/2018 Chemotherapy   Faslodex with Herceptin and Perjeta along with Zometa every 4 weeks    03/02/2018 - 05/25/2018 Chemotherapy   [place]    05/11/2018 Imaging   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 Associated vasogenic edema within the underlying right cerebral hemisphere without significant midline shift. Signal abnormality throughout the visualized bone marrow, compatible with osseous metastatic disease.      06/04/2018 Imaging   CT CAP: Right lower lobe lung nodule 7 mm (was 5 mm); multiple bone metastases throughout the spine and ribs sternum scapula and humerus, slightly increased lower thoracic mets, right renal lesion 2.5 cm (was 1.2 cm) right femur met increased from 2.1 cm to 2.9 cm   06/15/2018 -  Anti-estrogen oral therapy   Abemaciclib, Herceptin, letrozole, Xgeva   06/07/2019 Imaging   Progression of brain metastases,2 discrete dural-based lesions involving the posterior right frontal lobe. The more anterior lesion now measures 16.5 x 17.5 x 14 mm. The posterior lesion now measures 9.5 x 13 x 20 mm.  Vasogenic edema of the frontal and parietal lobes   06/21/2019 - 06/27/2019 Radiation Therapy   SRS to the brain   Metastatic breast cancer ([place])  06/29/2017 Initial Diagnosis   Metastatic breast cancer ([place])   06/15/2018 -  Chemotherapy   The patient had trastuzumab (HERCEPTIN) 546 mg in sodium chloride 0.9 % 250 mL chemo infusion, 8 mg/kg = 546 mg, Intravenous,  Once, 13 of 13 cycles Administration: 546 mg (06/15/2018), 399 mg (09/07/2018), 399 mg (07/13/2018), 399 mg (10/12/2018), 399  mg (11/02/2018), 399 mg (11/30/2018), 399 mg (01/24/2019), 399 mg (12/28/2018), 399 mg (02/22/2019), 399 mg (03/22/2019), 399 mg (04/17/2019), 399 mg (08/10/2018), 399 mg (05/17/2019) trastuzumab-dkst (OGIVRI) 399 mg in sodium chloride 0.9 % 250 mL chemo infusion, 6 mg/kg = 399 mg (100 % of original dose  6 mg/kg), Intravenous,  Once, 10 of 17 cycles Dose modification: 6 mg/kg (original dose 6 mg/kg, Cycle 14, Reason: Other (see comments), Comment: Biosimilar Conversion) Administration: 399 mg (06/14/2019), 399 mg (07/12/2019), 399 mg (08/09/2019), 399 mg (09/06/2019), 399 mg (10/04/2019), 399 mg (11/01/2019), 399 mg (11/29/2019), 399 mg (12/27/2019), 399 mg (01/24/2020), 399 mg (02/21/2020)  for chemotherapy treatment.      CURRENT THERAPY: Xgeva every 12 weeks, Herceptin every 4 weeks, Abemaciclib, Letrozole  INTERVAL HISTORY: [name] Riley 69 y.o. female returns for evaluation prior to receiving her next treatment with Herceptin.  Her most recent echocardiogram was completed on 09/25/2019 and showed an EF of 60-65%.  We are following these every 6 months.     She saw Dr. Lindi Adie a few weeks ago and was recommended to dose reduce her verzenio to 124m po bid.  She did that and that change happened about 3 weeks ago.  She notes that since the decrease of the verzenio, her diarrhea has improved.    She continues on Letrozole daily.  She has arthralgias related to this.    She recently saw Dr. VMickeal Skinnerwho decreased the dosage of her Keppra to 1/2 tablet BID. She is going to undergo repeat brain MRI in 04/2020.  She receives Xgeva every 12 weeks, and her next dose is due today.  She denies any dental concerns or upcoming/previous dental work.    She is having right hip pain and this is intermittent and varies in severity.  Worse after sitting for increased amounts of time.  She denies any difficulty with weight bearing on her right leg, or difficulty with completing her activities.       Patient Active Problem List   Diagnosis Date Noted  . Brain metastasis ([place] 07/13/2018  . Goals of care, counseling/discussion 06/05/2018  . UTI (urinary tract infection) 05/31/2018  . Sepsis secondary to UTI ([place] 05/31/2018  . Thrombocytopenia ([place] 05/31/2018  . Hypokalemia 05/31/2018  . Port-A-Cath in place  03/23/2018  . Elevated LFTs   . Metastatic breast cancer ([place] 06/29/2017  . Nodule of skin of breast 03/06/2017  . Abscess, abdomen 07/06/2016  . Metastasis to bone ([facility] 06/01/2016  . Pneumothorax on left 04/25/2016  . Chest pain 10/23/2014  . Radiation-induced dermatitis 07/27/2012  . Hypertension   . Breast cancer of upper-outer quadrant of left female breast ([place] 09/01/2011  . Hypothyroidism 06/12/2009  . Mixed hyperlipidemia 06/12/2009  . Essential hypertension 06/12/2009  . Coronary atherosclerosis 06/12/2009  . DIZZINESS 06/12/2009  . PALPITATIONS 06/12/2009    is allergic to cantaloupe extract allergy skin test; contrast media [iodinated diagnostic agents]; pravastatin; and zosyn [piperacillin sod-tazobactam so].  MEDICAL HISTORY: Past Medical History:  Diagnosis Date  . Allergy   . Breast cancer ([place] 08/25/2011   L , invasive ductal carcinoma, ER/PR +,HER2 -  . Cancer ([facility]    left breast cancer  . Coronary artery disease 2001  . Heart attack ([facility] 09/2000   Sep 25, 2000  --no intervention  . History of chemotherapy comp. 08/22/2012   4 cycles of FEC and $ cycles of Taxotere  . Hyperlipidemia   . Hypertension   . Hypothyroidism   . PONV (postoperative nausea  and vomiting)    gets sick from anesthesia  . Status post radiation therapy 07/09/12 - 08/22/2012   Left Breast, 60.4 gray    SURGICAL HISTORY: Past Surgical History:  Procedure Laterality Date  . ABDOMINAL HYSTERECTOMY  1998   TAH, oophorectomy  . APPENDECTOMY  1970  . BREAST SURGERY  1998   removal of benign lump in rt breast  . BREAST SURGERY  11/14/11   right simple mastectomy, left mrm  . INCISION AND DRAINAGE OF WOUND Left 07/01/2017   Procedure: IRRIGATION AND DEBRIDEMENT CHEST WALL ABSCESS;  Surgeon: Donnie Mesa, MD;  Location: [facility];  Service: General;  Laterality: Left;  Marland Kitchen MASS EXCISION Left 06/22/2017   Procedure: EXCISION OF CHEST WALL MASSES;  Surgeon: Donnie Mesa, MD;  Location: [facility];  Service: General;  Laterality: Left;  Marland Kitchen MASTECTOMY Bilateral    for left breast cancer  . OVARIAN CYST SURGERY Right 1970  . PORT-A-CATH REMOVAL  08/30/2012   Procedure: REMOVAL PORT-A-CATH;  Surgeon: Imogene Burn. [name], MD;  Location: [facility];  Service: General;  Laterality: Right;  port removal  . PORTACATH PLACEMENT  11/14/2011   Procedure: INSERTION PORT-A-CATH;  Surgeon: Imogene Burn. [name], MD;  Location: [facility];  Service: General;  Laterality: Right;  . PORTACATH PLACEMENT N/A 04/25/2016   Procedure: INSERTION PORT-A-CATH LEFT CHEST;  Surgeon: Donnie Mesa, MD;  Location: [place];  Service: General;  Laterality: N/A;  . skin tags  05/09/1997   left axillary left neck skin tags  . TONSILLECTOMY  1968    SOCIAL HISTORY: Social History   Socioeconomic History  . Marital status: Married    Spouse name: Not on file  . Number of children: 3  . Years of education: Not on file  . Highest education level: High school graduate  Occupational History  . Occupation: Works at [employer]  Tobacco Use  . Smoking status: Never Smoker  . Smokeless tobacco: Never Used  Substance and Sexual Activity  . Alcohol use: No  . Drug use: No  . Sexual activity: Yes    Birth control/protection: Surgical    Comment: menarche 56, Parity age 30, G6, P3, 1 miscarriage,  HRT x 5-10 yrs, Mild Hot Flashes  Other Topics Concern  . Not on file  Social History Narrative   Lives at home.   Social Determinants of Health   Financial Resource Strain:   . Difficulty of Paying Living Expenses:   Food Insecurity:   . Worried About Charity fundraiser in the Last Year:   . Arboriculturist in the Last Year:   Transportation Needs: No Transportation Needs  . Lack of Transportation (Medical): No  . Lack of Transportation (Non-Medical): No  Physical Activity:   . Days of Exercise per Week:   . Minutes of Exercise per Session:   Stress:   . Feeling of Stress :    Social Connections:   . Frequency of Communication with Friends and Family:   . Frequency of Social Gatherings with Friends and Family:   . Attends Religious Services:   . Active Member of Clubs or Organizations:   . Attends Archivist Meetings:   Marland Kitchen Marital Status:   Intimate Partner Violence:   . Fear of Current or Ex-Partner:   . Emotionally Abused:   Marland Kitchen Physically Abused:   . Sexually Abused:     FAMILY HISTORY: Family History  Problem Relation Age of Onset  .  Hypertension Maternal Grandmother   . Diabetes Maternal Grandmother   . Cancer Father 4       lung cancer and Prostate Cancer  . Hypertension Mother   . Cancer Paternal Aunt        ovarian  . Cancer Cousin        breast, paternal cousin  . Cancer Paternal Uncle        stomach  . Cancer Paternal Grandfather        Esophagus  . Colon cancer Neg Hx     Review of Systems  Constitutional: Negative for appetite change, chills, fatigue and unexpected weight change.  HENT:   Negative for hearing loss and lump/mass.   Eyes: Negative for eye problems and icterus.  Respiratory: Negative for chest tightness, cough and shortness of breath.   Cardiovascular: Negative for chest pain, leg swelling and palpitations.  Gastrointestinal: Negative for abdominal distention, abdominal pain, constipation, diarrhea, nausea and vomiting.  Endocrine: Negative for hot flashes.  Genitourinary: Negative for difficulty urinating and dysuria.   Musculoskeletal: Negative for arthralgias.  Skin: Negative for itching and rash.  Neurological: Negative for dizziness, extremity weakness and numbness.  Hematological: Negative for adenopathy. Does not bruise/bleed easily.  Psychiatric/Behavioral: Negative for depression and suicidal ideas. The patient is not nervous/anxious.       PHYSICAL EXAMINATION  ECOG PERFORMANCE STATUS: 1 - Symptomatic but completely ambulatory  Vitals:   03/20/20 0942  BP: (!) 155/88  Pulse: 65  Resp:  17  Temp: 98.5 F (36.9 C)  SpO2: 99%    Physical Exam Constitutional:      General: She is not in acute distress.    Appearance: Normal appearance. She is not toxic-appearing.  HENT:     Head: Normocephalic and atraumatic.     Mouth/Throat:     Mouth: Mucous membranes are dry.  Eyes:     General: No scleral icterus.    Pupils: Pupils are equal, round, and reactive to light.  Cardiovascular:     Rate and Rhythm: Normal rate and regular rhythm.     Pulses: Normal pulses.     Heart sounds: Normal heart sounds.  Pulmonary:     Effort: Pulmonary effort is normal.     Breath sounds: Normal breath sounds.  Abdominal:     General: Abdomen is flat. Bowel sounds are normal.     Palpations: Abdomen is soft.  Musculoskeletal:        General: No swelling.     Cervical back: Neck supple.  Lymphadenopathy:     Cervical: No cervical adenopathy.  Skin:    General: Skin is warm and dry.     Capillary Refill: Capillary refill takes less than 2 seconds.     Findings: No rash.     Comments: + brittle fingernails throughout, no darkening of nail beds noted   Neurological:     General: No focal deficit present.     Mental Status: She is alert and oriented to person, place, and time.  Psychiatric:        Mood and Affect: Mood normal.        Behavior: Behavior normal.     LABORATORY DATA:  CBC    Component Value Date/Time   WBC 2.2 (L) 03/20/2020 0839   WBC 4.3 07/01/2018 1036   RBC 2.52 (L) 03/20/2020 0839   HGB 9.1 (L) 03/20/2020 0839   HGB 10.6 (L) 12/12/2019 0800   HGB 11.5 (L) 09/22/2017 0958   HCT 27.2 (L) 03/20/2020  0839   HCT 31.4 (L) 12/12/2019 0800   HCT 34.9 09/22/2017 0958   PLT 71 (L) 03/20/2020 0839   PLT 82 (LL) 12/12/2019 0800   MCV 107.9 (H) 03/20/2020 0839   MCV 105 (H) 12/12/2019 0800   MCV 94.3 09/22/2017 0958   MCH 36.1 (H) 03/20/2020 0839   MCHC 33.5 03/20/2020 0839   RDW 13.6 03/20/2020 0839   RDW 14.4 12/12/2019 0800   RDW 15.3 (H) 09/22/2017 0958    LYMPHSABS 1.2 03/20/2020 0839   LYMPHSABS 1.5 12/12/2019 0800   LYMPHSABS 2.2 09/22/2017 0958   MONOABS 0.2 03/20/2020 0839   MONOABS 0.8 09/22/2017 0958   EOSABS 0.1 03/20/2020 0839   EOSABS 0.1 12/12/2019 0800   BASOSABS 0.0 03/20/2020 0839   BASOSABS 0.1 12/12/2019 0800   BASOSABS 0.0 09/22/2017 0958    CMP     Component Value Date/Time   NA 140 03/20/2020 0839   NA 140 01/23/2020 0857   NA 140 09/22/2017 0958   K 3.6 03/20/2020 0839   K 3.7 09/22/2017 0958   CL 103 03/20/2020 0839   CL 105 08/10/2012 0908   CO2 26 03/20/2020 0839   CO2 26 09/22/2017 0958   GLUCOSE 104 (H) 03/20/2020 0839   GLUCOSE 80 09/22/2017 0958   GLUCOSE 81 08/10/2012 0908   BUN 14 03/20/2020 0839   BUN 15 01/23/2020 0857   BUN 12.8 09/22/2017 0958   CREATININE 0.94 03/20/2020 0839   CREATININE 0.6 09/22/2017 0958   CALCIUM 9.8 03/20/2020 0839   CALCIUM 9.1 09/22/2017 0958   PROT 6.8 03/20/2020 0839   PROT 6.5 12/12/2019 0800   PROT 7.0 09/22/2017 0958   ALBUMIN 3.7 03/20/2020 0839   ALBUMIN 4.2 12/12/2019 0800   ALBUMIN 4.0 09/22/2017 0958   AST 59 (H) 03/20/2020 0839   AST 37 (H) 09/22/2017 0958   ALT 43 03/20/2020 0839   ALT 31 09/22/2017 0958   ALKPHOS 272 (H) 03/20/2020 0839   ALKPHOS 140 09/22/2017 0958   BILITOT 0.6 03/20/2020 0839   BILITOT 0.41 09/22/2017 0958   GFRNONAA >60 03/20/2020 0839   GFRAA >60 03/20/2020 0839        ASSESSMENT and THERAPY PLAN:   Breast cancer of upper-outer quadrant of left female breast ([place]) Current treatment: Abemaciclibwith letrozole along with Herceptin(to be given every 4 weeks)and Xgeva  CT CAP 01/02/2020: Widespread sclerotic bone metastases.  Unchanged from prior exam.  No other soft tissue metastases. 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  Toxicities: Cytopenias Thrombocytopenia: Monitoring closely, plt ct 71. Anemia: Today's hemoglobin is 9.1   Leukopenia: Today's ANC  0.8  I  recommended that due to her cytopenias that she not take the Verzenio x 1 week per dosing guidelines and return next week for lab and f/u with Dr. Lindi Adie.  This will allow her bone marrow to recover some, and she can resume her reduced dose.    She will proceed with Trasutuzmab and Xgeva today.  I counseled her on taking extra calcium and I placed orders for her echo to be completed at [facility] this month as she is due.    She will return in 1 week for labs and f/u with Dr. Lindi Adie.    Orders Placed This Encounter  Procedures  . ECHOCARDIOGRAM COMPLETE    Standing Status:   Future    Standing Expiration Date:   03/20/2021    Order Specific Question:   Where should this test be performed  Answer:   [facility]    Order Specific Question:   Perflutren DEFINITY (image enhancing agent) should be administered unless hypersensitivity or allergy exist    Answer:   Administer Perflutren    Order Specific Question:   Is a special reader required? (athlete or structural heart)    Answer:   No    Order Specific Question:   Reason for exam-Echo    Answer:   Chemotherapy evaluation  v87.41 / v58.11    All questions were answered. The patient knows to call the clinic with any problems, questions or concerns. We can certainly see the patient much sooner if necessary.  Total encounter time: 30 minutes*  Wilber Bihari, NP 03/20/20 10:22 AM Medical Oncology and Hematology [facility] [place], [place] [address] Tel. [phone]    Fax. [phone]  *Total Encounter Time as defined by the Centers for Medicare and Medicaid Services includes, in addition to the face-to-face time of a patient visit (documented in the note above) non-face-to-face time: obtaining and reviewing outside history, ordering and reviewing medications, tests or procedures, care coordination (communications with other health care professionals or caregivers) and documentation in the medical  record.

## 2020-03-20 NOTE — Assessment & Plan Note (Addendum)
Current treatment: Abemaciclibwith letrozole along with Herceptin(to be given every 4 weeks)and Xgeva  CT CAP 01/02/2020: Widespread sclerotic bone metastases.  Unchanged from prior exam.  No other soft tissue metastases. 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  Toxicities: Cytopenias Thrombocytopenia: Monitoring closely, plt ct 71. Anemia: Today's hemoglobin is 9.1   Leukopenia: Today's ANC  0.8  I recommended that due to her cytopenias that she not take the Verzenio x 1 week per dosing guidelines and return next week for lab and f/u with Dr. Lindi Adie.  This will allow her bone marrow to recover some, and she can resume her reduced dose.    She will proceed with Trasutuzmab and Xgeva today.  I counseled her on taking extra calcium and I placed orders for her echo to be completed at [facility] this month as she is due.    She will return in 1 week for labs and f/u with Dr. Lindi Adie.

## 2020-03-20 NOTE — Patient Instructions (Signed)
[facility] Discharge Instructions for Patients Receiving Chemotherapy  Today you received the following Immunotherapy agent: Trastuzumab (OGIVRI)  To help prevent nausea and vomiting after your treatment, we encourage you to take your nausea medication as directed by your MD.   If you develop nausea and vomiting that is not controlled by your nausea medication, call the clinic.   BELOW ARE SYMPTOMS THAT SHOULD BE REPORTED IMMEDIATELY:  *FEVER GREATER THAN 100.5 F  *CHILLS WITH OR WITHOUT FEVER  NAUSEA AND VOMITING THAT IS NOT CONTROLLED WITH YOUR NAUSEA MEDICATION  *UNUSUAL SHORTNESS OF BREATH  *UNUSUAL BRUISING OR BLEEDING  TENDERNESS IN MOUTH AND THROAT WITH OR WITHOUT PRESENCE OF ULCERS  *URINARY PROBLEMS  *BOWEL PROBLEMS  UNUSUAL RASH Items with * indicate a potential emergency and should be followed up as soon as possible.  Feel free to call the clinic should you have any questions or concerns. The clinic phone number is [phone].  Please show the [place] at check-in to the Emergency Department and triage nurse.  Denosumab injection What is this medicine? DENOSUMAB (den oh sue mab) slows bone breakdown. Prolia is used to treat osteoporosis in women after menopause and in men, and in people who are taking corticosteroids for 6 months or more. Delton See is used to treat a high calcium level due to cancer and to prevent bone fractures and other bone problems caused by multiple myeloma or cancer bone metastases. Delton See is also used to treat giant cell tumor of the bone. This medicine may be used for other purposes; ask your health care provider or pharmacist if you have questions. COMMON BRAND NAME(S): Prolia, XGEVA What should I tell my health care provider before I take this medicine? They need to know if you have any of these conditions:  dental disease  having surgery or tooth extraction  infection  kidney disease  low levels of calcium  or Vitamin D in the blood  malnutrition  on hemodialysis  skin conditions or sensitivity  thyroid or parathyroid disease  an unusual reaction to denosumab, other medicines, foods, dyes, or preservatives  pregnant or trying to get pregnant  breast-feeding How should I use this medicine? This medicine is for injection under the skin. It is given by a health care professional in a hospital or clinic setting. A special MedGuide will be given to you before each treatment. Be sure to read this information carefully each time. For Prolia, talk to your pediatrician regarding the use of this medicine in children. Special care may be needed. For Delton See, talk to your pediatrician regarding the use of this medicine in children. While this drug may be prescribed for children as young as 13 years for selected conditions, precautions do apply. Overdosage: If you think you have taken too much of this medicine contact a poison control center or emergency room at once. NOTE: This medicine is only for you. Do not share this medicine with others. What if I miss a dose? It is important not to miss your dose. Call your doctor or health care professional if you are unable to keep an appointment. What may interact with this medicine? Do not take this medicine with any of the following medications:  other medicines containing denosumab This medicine may also interact with the following medications:  medicines that lower your chance of fighting infection  steroid medicines like prednisone or cortisone This list may not describe all possible interactions. Give your health care provider a list of all  the medicines, herbs, non-prescription drugs, or dietary supplements you use. Also tell them if you smoke, drink alcohol, or use illegal drugs. Some items may interact with your medicine. What should I watch for while using this medicine? Visit your doctor or health care professional for regular checks on your  progress. Your doctor or health care professional may order blood tests and other tests to see how you are doing. Call your doctor or health care professional for advice if you get a fever, chills or sore throat, or other symptoms of a cold or flu. Do not treat yourself. This drug may decrease your body's ability to fight infection. Try to avoid being around people who are sick. You should make sure you get enough calcium and vitamin D while you are taking this medicine, unless your doctor tells you not to. Discuss the foods you eat and the vitamins you take with your health care professional. See your dentist regularly. Brush and floss your teeth as directed. Before you have any dental work done, tell your dentist you are receiving this medicine. Do not become pregnant while taking this medicine or for 5 months after stopping it. Talk with your doctor or health care professional about your birth control options while taking this medicine. Women should inform their doctor if they wish to become pregnant or think they might be pregnant. There is a potential for serious side effects to an unborn child. Talk to your health care professional or pharmacist for more information. What side effects may I notice from receiving this medicine? Side effects that you should report to your doctor or health care professional as soon as possible:  allergic reactions like skin rash, itching or hives, swelling of the face, lips, or tongue  bone pain  breathing problems  dizziness  jaw pain, especially after dental work  redness, blistering, peeling of the skin  signs and symptoms of infection like fever or chills; cough; sore throat; pain or trouble passing urine  signs of low calcium like fast heartbeat, muscle cramps or muscle pain; pain, tingling, numbness in the hands or feet; seizures  unusual bleeding or bruising  unusually weak or tired Side effects that usually do not require medical attention  (report to your doctor or health care professional if they continue or are bothersome):  constipation  diarrhea  headache  joint pain  loss of appetite  muscle pain  runny nose  tiredness  upset stomach This list may not describe all possible side effects. Call your doctor for medical advice about side effects. You may report side effects to FDA at 1-800-FDA-1088. Where should I keep my medicine? This medicine is only given in a clinic, doctor's office, or other health care setting and will not be stored at home. NOTE: This sheet is a summary. It may not cover all possible information. If you have questions about this medicine, talk to your doctor, pharmacist, or health care provider.  2020 Elsevier/Gold Standard (2018-02-09 16:10:44)

## 2020-03-23 ENCOUNTER — Telehealth: Payer: Self-pay | Admitting: Adult Health

## 2020-03-23 NOTE — Telephone Encounter (Signed)
Scheduled appts per 6/4 los. Pt confirmed appt date and time.

## 2020-03-26 ENCOUNTER — Ambulatory Visit: Payer: Medicare Other | Admitting: Hematology and Oncology

## 2020-03-26 ENCOUNTER — Ambulatory Visit: Payer: Medicare Other

## 2020-03-26 ENCOUNTER — Other Ambulatory Visit: Payer: Medicare Other

## 2020-03-26 NOTE — Progress Notes (Signed)
Patient Care Team: [name], MD as PCP - General (Family Medicine) Nicholas Lose, MD as Consulting Physician (Hematology and Oncology)  DIAGNOSIS:    ICD-10-CM   1. Malignant neoplasm of upper-outer quadrant of left breast in female, estrogen receptor positive ([place])  C50.412    Z17.0   2. Metastatic breast cancer ([place])  C50.919     SUMMARY OF ONCOLOGIC HISTORY: Oncology History  Breast cancer of upper-outer quadrant of left female breast ([place])  11/14/2011 Surgery   Bilateral mastectomy, prophylactic on the right, left breast IDC 3/18 lymph nodes positive with extracapsular extension ER 89%, PR 81%, HER-2 negative, Ki-67 79% T2 N1 A. stage IIB   12/13/2011 - 06/28/2012 Chemotherapy   4 cycles of FEC followed by 4 cycles of Taxotere   07/17/2012 - 08/22/2012 Radiation Therapy   Adjuvant radiation therapy   08/22/2012 - 03/16/2016 Anti-estrogen oral therapy   Arimidex 1 mg daily   03/16/2016 Relapse/Recurrence   Subcutaneous nodule excision left chest: Infiltrating carcinoma breast primary, ER positive, PR negative   03/29/2016 Imaging   CT CAP and bone scan: Lytic lesions T8 vertebral, T1 posterior element, subcutaneous nodule left lateral chest wall, nonspecific lung nodules; Bone scan: Mets to kull, left humerus, left eighth rib, T7/T8, sternum, left acetabulum   04/28/2016 - 06/17/2017 Chemotherapy   Herceptin, lapatinib, Faslodex, Zometa every 4 weeks, lapatinib discontinued in September 2018 due to elevation of LFTs   06/22/2017 Relapse/Recurrence   Surgical excision:Soft tissue mass left lateral chest wall primary breast cancer, soft tissue mass left medial chest wall breast cancer, tumor is within the dermis extending to the subcutaneous adipose tissue and involves portions of skeletal muscle   06/22/2017 Cancer Staging   Staging form: Breast, AJCC 7th Edition - Pathologic stage from 06/22/2017: Stage IV (TX, NX, M1) - Signed by Nicholas Lose, MD on 12/27/2019   08/2017 -  02/16/2018 Chemotherapy   Faslodex with Herceptin and Perjeta along with Zometa every 4 weeks    03/02/2018 - 05/25/2018 Chemotherapy   Kad[place]la    05/11/2018 Imaging   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 Associated vasogenic edema within the underlying right cerebral hemisphere without significant midline shift. Signal abnormality throughout the visualized bone marrow, compatible with osseous metastatic disease.      06/04/2018 Imaging   CT CAP: Right lower lobe lung nodule 7 mm (was 5 mm); multiple bone metastases throughout the spine and ribs sternum scapula and humerus, slightly increased lower thoracic mets, right renal lesion 2.5 cm (was 1.2 cm) right femur met increased from 2.1 cm to 2.9 cm   06/15/2018 -  Anti-estrogen oral therapy   Abemaciclib, Herceptin, letrozole, Xgeva   06/07/2019 Imaging   Progression of brain metastases,2 discrete dural-based lesions involving the posterior right frontal lobe. The more anterior lesion now measures 16.5 x 17.5 x 14 mm. The posterior lesion now measures 9.5 x 13 x 20 mm.  Vasogenic edema of the frontal and parietal lobes   06/21/2019 - 06/27/2019 Radiation Therapy   SRS to the brain   Metastatic breast cancer ([place])  06/29/2017 Initial Diagnosis   Metastatic breast cancer ([place])   06/15/2018 -  Chemotherapy   The patient had trastuzumab (HERCEPTIN) 546 mg in sodium chloride 0.9 % 250 mL chemo infusion, 8 mg/kg = 546 mg, Intravenous,  Once, 13 of 13 cycles Administration: 546 mg (06/15/2018), 399 mg (09/07/2018), 399 mg (07/13/2018), 399 mg (10/12/2018), 399 mg (11/02/2018), 399 mg (11/30/2018), 399 mg (01/24/2019), 399 mg (12/28/2018), 399 mg (  02/22/2019), 399 mg (03/22/2019), 399 mg (04/17/2019), 399 mg (08/10/2018), 399 mg (05/17/2019) trastuzumab-dkst (OGIVRI) 399 mg in sodium chloride 0.9 % 250 mL chemo infusion, 6 mg/kg = 399 mg (100 % of original dose 6 mg/kg), Intravenous,  Once, 11 of 17 cycles Dose modification: 6 mg/kg  (original dose 6 mg/kg, Cycle 14, Reason: Other (see comments), Comment: Biosimilar Conversion) Administration: 399 mg (06/14/2019), 399 mg (07/12/2019), 399 mg (08/09/2019), 399 mg (09/06/2019), 399 mg (10/04/2019), 399 mg (11/01/2019), 399 mg (11/29/2019), 399 mg (12/27/2019), 399 mg (01/24/2020), 399 mg (02/21/2020), 399 mg (03/20/2020)  for chemotherapy treatment.      CHIEF COMPLIANT: Follow-up on Herceptin with abemaciclib,letrozole,andXgeva  INTERVAL HISTORY: [name] Riley is a 69 y.o. with above-mentioned history of metastatic breast cancer currently on abemaciclib with letrozole along with Herceptin and Xgeva.She presents to theclinictoday for treatment.  Last week abemaciclib was held because of ANC of 0.7.  She saw Mendel Ryder at that time.  She was sent to me to recheck her blood work.  Today her [place] is 1.1 and therefore she can resume her abemaciclib.  ALLERGIES:  is allergic to cantaloupe extract allergy skin test, contrast media [iodinated diagnostic agents], pravastatin, and zosyn [piperacillin sod-tazobactam so].  MEDICATIONS:  Current Outpatient Medications  Medication Sig Dispense Refill  . abemaciclib (VERZENIO) 100 MG tablet Take 1 tablet (100 mg total) by mouth 2 (two) times daily. Swallow tablets whole. Do not chew, crush, or split tablets before swallowing. 60 tablet 3  . acetaminophen (TYLENOL) 500 MG tablet Take 1,000 mg by mouth every 6 (six) hours as needed for moderate pain.    . cholecalciferol (VITAMIN D) 1000 units tablet Take 1 tablet (1,000 Units total) by mouth daily.    . diphenhydrAMINE (BENADRYL) 25 MG tablet Take 50 mg by mouth at bedtime as needed for sleep.    Marland Kitchen letrozole (FEMARA) 2.5 MG tablet TAKE ONE (1) TABLET EACH DAY 90 tablet 3  . levETIRAcetam (KEPPRA) 500 MG tablet TAKE ONE TABLET BY MOUTH TWICE DAILY 60 tablet 2  . levothyroxine (SYNTHROID) 100 MCG tablet TAKE ONE TABLET EACH MORNING BEFORE BREAKFAST 90 tablet 3  . lidocaine-prilocaine (EMLA) cream  APPLY TOPICALLY AS NEEDED FOR PORT ACCESS 30 g 3  . metoprolol succinate (TOPROL-XL) 100 MG 24 hr tablet Take 1 tablet (100 mg total) by mouth daily. 90 tablet 3  . Multiple Vitamins-Minerals (MULTIVITAMIN WITH MINERALS) tablet Take 1 tablet by mouth daily.      . ondansetron (ZOFRAN-ODT) 4 MG disintegrating tablet TAKE 1 TABLET EVERY 6 HOURS AS NEEDED FOR NAUSEA AND VOMITING 30 tablet 3  . predniSONE (DELTASONE) 50 MG tablet For contrast dye allergy 3 tablet 0  . rosuvastatin (CRESTOR) 10 MG tablet Take 10 mg by mouth daily.    . valsartan (DIOVAN) 80 MG tablet Take 1 tablet (80 mg total) by mouth daily. for blood pressure 90 tablet 1   No current facility-administered medications for this visit.   Facility-Administered Medications Ordered in Other Visits  Medication Dose Route Frequency Provider Last Rate Last Admin  . 0.9 %  sodium chloride infusion   Intravenous Continuous Donnie Mesa, MD      . acetaminophen (TYLENOL) tablet 975 mg  975 mg Oral Q6H Donnie Mesa, MD      . enoxaparin (LOVENOX) injection 40 mg  40 mg Subcutaneous Q24H Donnie Mesa, MD      . morphine 4 MG/ML injection 2-4 mg  2-4 mg Intravenous Q2H PRN Donnie Mesa, MD      .  ondansetron (ZOFRAN-ODT) disintegrating tablet 4 mg  4 mg Oral Q6H PRN Donnie Mesa, MD       Or  . ondansetron (ZOFRAN) 4 mg in sodium chloride 0.9 % 50 mL IVPB  4 mg Intravenous Q6H PRN Donnie Mesa, MD      . oxyCODONE (Oxy IR/ROXICODONE) immediate release tablet 5-10 mg  5-10 mg Oral Q4H PRN Donnie Mesa, MD      . piperacillin-tazobactam (ZOSYN) 3.375 g in dextrose 5 % 50 mL IVPB  3.375 g Intravenous Q8H Donnie Mesa, MD      . prochlorperazine (COMPAZINE) tablet 10 mg  10 mg Oral Q6H PRN Donnie Mesa, MD       Or  . prochlorperazine (COMPAZINE) injection 5-10 mg  5-10 mg Intravenous Q6H PRN Donnie Mesa, MD        PHYSICAL EXAMINATION: ECOG PERFORMANCE STATUS: 1 - Symptomatic but completely ambulatory  Vitals:    03/27/20 1149  BP: (!) 147/77  Pulse: 82  Resp: 18  Temp: 98.9 F (37.2 C)  SpO2: 99%   Filed Weights   03/27/20 1149  Weight: 142 lb 11.2 oz (64.7 kg)    LABORATORY DATA:  I have reviewed the data as listed CMP Latest Ref Rng & Units 03/27/2020 03/20/2020 02/21/2020  Glucose 70 - 99 mg/dL 85 104(H) 78  BUN 8 - 23 mg/dL 16 14 12   Creatinine 0.44 - 1.00 mg/dL 0.78 0.94 0.84  Sodium 135 - 145 mmol/L 143 140 140  Potassium 3.5 - 5.1 mmol/L 3.6 3.6 3.3(L)  Chloride 98 - 111 mmol/L 104 103 103  CO2 22 - 32 mmol/L 29 26 28   Calcium 8.9 - 10.3 mg/dL 9.6 9.8 9.6  Total Protein 6.5 - 8.1 g/dL 6.8 6.8 7.2  Total Bilirubin 0.3 - 1.2 mg/dL 0.6 0.6 0.6  Alkaline Phos 38 - 126 U/L 278(H) 272(H) 242(H)  AST 15 - 41 U/L 78(H) 59(H) 89(H)  ALT 0 - 44 U/L 58(H) 43 70(H)    Lab Results  Component Value Date   WBC 3.1 (L) 03/27/2020   HGB 9.8 (L) 03/27/2020   HCT 29.1 (L) 03/27/2020   MCV 107.8 (H) 03/27/2020   PLT 79 (L) 03/27/2020   NEUTROABS 1.1 (L) 03/27/2020    ASSESSMENT & PLAN:  Metastatic breast cancer ([place]) Current treatment: Abemaciclibwith letrozole along with Herceptin(to be given every 4 weeks)and Xgeva started 06/15/2018  CT CAP 01/02/2020: Widespread sclerotic bone metastases.  Unchanged from prior exam.  No other soft tissue metastases. 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  Toxicities:Cytopenias Thrombocytopenia: Monitoring closely, platelet count 79 Anemia: Today's hemoglobin is 9.8 Leukopenia: Today's ANC 1.1  I encouraged her to resume abemaciclib. Return to clinic in 1 month for labs and follow-up.  No orders of the defined types were placed in this encounter.  The patient has a good understanding of the overall plan. she agrees with it. she will call with any problems that may develop before the next visit here.  Total time spent: 20 mins including face to face time and time spent for planning, charting and  coordination of care  Nicholas Lose, MD 03/27/2020  I, Cloyde Reams Dorshimer, am acting as scribe for Dr. Nicholas Lose.  I have reviewed the above documentation for accuracy and completeness, and I agree with the above.'

## 2020-03-27 ENCOUNTER — Inpatient Hospital Stay (HOSPITAL_BASED_OUTPATIENT_CLINIC_OR_DEPARTMENT_OTHER): Payer: Medicare Other | Admitting: Hematology and Oncology

## 2020-03-27 ENCOUNTER — Other Ambulatory Visit: Payer: Medicare Other

## 2020-03-27 ENCOUNTER — Inpatient Hospital Stay: Payer: Medicare Other

## 2020-03-27 ENCOUNTER — Other Ambulatory Visit: Payer: Self-pay | Admitting: *Deleted

## 2020-03-27 ENCOUNTER — Other Ambulatory Visit: Payer: Self-pay

## 2020-03-27 VITALS — BP 147/77 | HR 82 | Temp 98.9°F | Resp 18 | Ht 68.0 in | Wt 142.7 lb

## 2020-03-27 DIAGNOSIS — Z170 Estrogen receptor positive status [ER+]: Secondary | ICD-10-CM | POA: Diagnosis not present

## 2020-03-27 DIAGNOSIS — Z5181 Encounter for therapeutic drug level monitoring: Secondary | ICD-10-CM

## 2020-03-27 DIAGNOSIS — C50919 Malignant neoplasm of unspecified site of unspecified female breast: Secondary | ICD-10-CM

## 2020-03-27 DIAGNOSIS — Z95828 Presence of other vascular implants and grafts: Secondary | ICD-10-CM

## 2020-03-27 DIAGNOSIS — C7951 Secondary malignant neoplasm of bone: Secondary | ICD-10-CM

## 2020-03-27 DIAGNOSIS — Z5112 Encounter for antineoplastic immunotherapy: Secondary | ICD-10-CM | POA: Diagnosis not present

## 2020-03-27 DIAGNOSIS — C50412 Malignant neoplasm of upper-outer quadrant of left female breast: Secondary | ICD-10-CM

## 2020-03-27 LAB — CMP (CANCER CENTER ONLY)
ALT: 58 U/L — ABNORMAL HIGH (ref 0–44)
AST: 78 U/L — ABNORMAL HIGH (ref 15–41)
Albumin: 3.7 g/dL (ref 3.5–5.0)
Alkaline Phosphatase: 278 U/L — ABNORMAL HIGH (ref 38–126)
Anion gap: 10 (ref 5–15)
BUN: 16 mg/dL (ref 8–23)
CO2: 29 mmol/L (ref 22–32)
Calcium: 9.6 mg/dL (ref 8.9–10.3)
Chloride: 104 mmol/L (ref 98–111)
Creatinine: 0.78 mg/dL (ref 0.44–1.00)
GFR, Est AFR Am: >60 mL/min (ref >60)
GFR, Estimated: >60 mL/min (ref >60)
Glucose, Bld: 85 mg/dL (ref 70–99)
Potassium: 3.6 mmol/L (ref 3.5–5.1)
Sodium: 143 mmol/L (ref 135–145)
Total Bilirubin: 0.6 mg/dL (ref 0.3–1.2)
Total Protein: 6.8 g/dL (ref 6.5–8.1)

## 2020-03-27 LAB — CBC WITH DIFFERENTIAL (CANCER CENTER ONLY)
Abs Immature Granulocytes: 0.02 10*3/uL (ref 0.00–0.07)
Basophils Absolute: 0 10*3/uL (ref 0.0–0.1)
Basophils Relative: 1 %
Eosinophils Absolute: 0.1 10*3/uL (ref 0.0–0.5)
Eosinophils Relative: 3 %
HCT: 29.1 % — ABNORMAL LOW (ref 36.0–46.0)
Hemoglobin: 9.8 g/dL — ABNORMAL LOW (ref 12.0–15.0)
Immature Granulocytes: 1 %
Lymphocytes Relative: 45 %
Lymphs Abs: 1.4 10*3/uL (ref 0.7–4.0)
MCH: 36.3 pg — ABNORMAL HIGH (ref 26.0–34.0)
MCHC: 33.7 g/dL (ref 30.0–36.0)
MCV: 107.8 fL — ABNORMAL HIGH (ref 80.0–100.0)
Monocytes Absolute: 0.5 10*3/uL (ref 0.1–1.0)
Monocytes Relative: 16 %
Neutro Abs: 1.1 10*3/uL — ABNORMAL LOW (ref 1.7–7.7)
Neutrophils Relative %: 34 %
Platelet Count: 79 10*3/uL — ABNORMAL LOW (ref 150–400)
RBC: 2.7 MIL/uL — ABNORMAL LOW (ref 3.87–5.11)
RDW: 13.6 % (ref 11.5–15.5)
WBC Count: 3.1 10*3/uL — ABNORMAL LOW (ref 4.0–10.5)
nRBC: 0 % (ref 0.0–0.2)

## 2020-03-27 MED ORDER — SODIUM CHLORIDE 0.9% FLUSH
10.0000 mL | Freq: Once | INTRAVENOUS | Status: AC
  Administered 2020-03-27: 10 mL
  Filled 2020-03-27: qty 10

## 2020-03-27 MED ORDER — HEPARIN SOD (PORK) LOCK FLUSH 100 UNIT/ML IV SOLN
500.0000 [IU] | Freq: Once | INTRAVENOUS | Status: AC
  Administered 2020-03-27: 500 [IU]
  Filled 2020-03-27: qty 5

## 2020-03-27 NOTE — Progress Notes (Signed)
Pt requesting to have echo preformed at [facility] hospital.  Orders updated and pt provided with scheduling number.

## 2020-03-27 NOTE — Assessment & Plan Note (Addendum)
Current treatment: Abemaciclibwith letrozole along with Herceptin(to be given every 4 weeks)and Xgeva started 06/15/2018  CT CAP 01/02/2020: Widespread sclerotic bone metastases.  Unchanged from prior exam.  No other soft tissue metastases. 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  Toxicities:Cytopenias Thrombocytopenia: Monitoring closely, platelet count Anemia: Today's hemoglobin is     Leukopenia: Today's ANC

## 2020-04-01 MED FILL — VERZENIO 100 MG TAB: 100 | 28 days supply | Qty: 56 | Fill #1

## 2020-04-02 ENCOUNTER — Telehealth: Payer: Self-pay | Admitting: Hematology and Oncology

## 2020-04-02 NOTE — Telephone Encounter (Signed)
Scheduled per 06/11 los, called patient and left a voicemail.

## 2020-04-03 ENCOUNTER — Other Ambulatory Visit: Payer: Self-pay | Admitting: Radiation Therapy

## 2020-04-07 ENCOUNTER — Other Ambulatory Visit: Payer: Self-pay

## 2020-04-07 ENCOUNTER — Ambulatory Visit (HOSPITAL_COMMUNITY)
Admission: RE | Admit: 2020-04-07 | Discharge: 2020-04-07 | Disposition: A | Discharge status: Home / Self Care | Payer: Medicare Other | Source: Ambulatory Visit | Attending: Hematology and Oncology | Admitting: Hematology and Oncology

## 2020-04-07 DIAGNOSIS — E785 Hyperlipidemia, unspecified: Secondary | ICD-10-CM | POA: Diagnosis not present

## 2020-04-07 DIAGNOSIS — Z5181 Encounter for therapeutic drug level monitoring: Secondary | ICD-10-CM | POA: Insufficient documentation

## 2020-04-07 DIAGNOSIS — C50919 Malignant neoplasm of unspecified site of unspecified female breast: Secondary | ICD-10-CM

## 2020-04-07 DIAGNOSIS — C50412 Malignant neoplasm of upper-outer quadrant of left female breast: Secondary | ICD-10-CM | POA: Diagnosis not present

## 2020-04-07 DIAGNOSIS — Z170 Estrogen receptor positive status [ER+]: Secondary | ICD-10-CM | POA: Diagnosis not present

## 2020-04-07 DIAGNOSIS — Z9012 Acquired absence of left breast and nipple: Secondary | ICD-10-CM | POA: Insufficient documentation

## 2020-04-07 DIAGNOSIS — I119 Hypertensive heart disease without heart failure: Secondary | ICD-10-CM | POA: Insufficient documentation

## 2020-04-07 DIAGNOSIS — Z79899 Other long term (current) drug therapy: Secondary | ICD-10-CM | POA: Diagnosis not present

## 2020-04-07 DIAGNOSIS — I080 Rheumatic disorders of both mitral and aortic valves: Secondary | ICD-10-CM | POA: Diagnosis not present

## 2020-04-07 NOTE — Progress Notes (Signed)
  Echocardiogram 2D Echocardiogram has been performed.  [name] Riley 04/07/2020, 9:13 AM

## 2020-04-10 ENCOUNTER — Ambulatory Visit: Payer: Medicare Other

## 2020-04-13 NOTE — Progress Notes (Signed)
Pharmacist Chemotherapy Monitoring - Follow Up Assessment    I verify that I have reviewed each item in the below checklist:  . Regimen for the patient is scheduled for the appropriate day and plan matches scheduled date. Marland Kitchen Appropriate non-routine labs are ordered dependent on drug ordered. . If applicable, additional medications reviewed and ordered per protocol based on lifetime cumulative doses and/or treatment regimen.   Plan for follow-up and/or issues identified: No . I-vent associated with next due treatment: No . MD and/or nursing notified: No  [name] Riley 04/13/2020 3:13 PM

## 2020-04-17 ENCOUNTER — Ambulatory Visit: Payer: Medicare Other

## 2020-04-17 ENCOUNTER — Other Ambulatory Visit: Payer: Medicare Other

## 2020-04-17 ENCOUNTER — Inpatient Hospital Stay: Payer: Medicare Other | Attending: Hematology and Oncology

## 2020-04-17 ENCOUNTER — Inpatient Hospital Stay: Payer: Medicare Other

## 2020-04-17 ENCOUNTER — Encounter: Payer: Self-pay | Admitting: Adult Health

## 2020-04-17 ENCOUNTER — Other Ambulatory Visit: Payer: Self-pay

## 2020-04-17 ENCOUNTER — Inpatient Hospital Stay (HOSPITAL_BASED_OUTPATIENT_CLINIC_OR_DEPARTMENT_OTHER): Payer: Medicare Other | Admitting: Adult Health

## 2020-04-17 VITALS — BP 156/95 | HR 71 | Temp 98.3°F | Resp 18 | Ht 68.0 in | Wt 141.4 lb

## 2020-04-17 DIAGNOSIS — C7951 Secondary malignant neoplasm of bone: Secondary | ICD-10-CM

## 2020-04-17 DIAGNOSIS — Z95828 Presence of other vascular implants and grafts: Secondary | ICD-10-CM

## 2020-04-17 DIAGNOSIS — Z801 Family history of malignant neoplasm of trachea, bronchus and lung: Secondary | ICD-10-CM | POA: Diagnosis not present

## 2020-04-17 DIAGNOSIS — I2510 Atherosclerotic heart disease of native coronary artery without angina pectoris: Secondary | ICD-10-CM | POA: Diagnosis not present

## 2020-04-17 DIAGNOSIS — C50412 Malignant neoplasm of upper-outer quadrant of left female breast: Secondary | ICD-10-CM

## 2020-04-17 DIAGNOSIS — M2550 Pain in unspecified joint: Secondary | ICD-10-CM | POA: Insufficient documentation

## 2020-04-17 DIAGNOSIS — Z8041 Family history of malignant neoplasm of ovary: Secondary | ICD-10-CM | POA: Diagnosis not present

## 2020-04-17 DIAGNOSIS — C7931 Secondary malignant neoplasm of brain: Secondary | ICD-10-CM

## 2020-04-17 DIAGNOSIS — Z452 Encounter for adjustment and management of vascular access device: Secondary | ICD-10-CM | POA: Diagnosis not present

## 2020-04-17 DIAGNOSIS — Z5112 Encounter for antineoplastic immunotherapy: Secondary | ICD-10-CM | POA: Diagnosis present

## 2020-04-17 DIAGNOSIS — E785 Hyperlipidemia, unspecified: Secondary | ICD-10-CM | POA: Insufficient documentation

## 2020-04-17 DIAGNOSIS — Z170 Estrogen receptor positive status [ER+]: Secondary | ICD-10-CM

## 2020-04-17 DIAGNOSIS — Z8042 Family history of malignant neoplasm of prostate: Secondary | ICD-10-CM | POA: Insufficient documentation

## 2020-04-17 DIAGNOSIS — E039 Hypothyroidism, unspecified: Secondary | ICD-10-CM | POA: Diagnosis not present

## 2020-04-17 DIAGNOSIS — Z800 Family history of malignant neoplasm of digestive organs: Secondary | ICD-10-CM | POA: Insufficient documentation

## 2020-04-17 DIAGNOSIS — Z923 Personal history of irradiation: Secondary | ICD-10-CM | POA: Insufficient documentation

## 2020-04-17 DIAGNOSIS — Z9013 Acquired absence of bilateral breasts and nipples: Secondary | ICD-10-CM | POA: Diagnosis not present

## 2020-04-17 DIAGNOSIS — D696 Thrombocytopenia, unspecified: Secondary | ICD-10-CM | POA: Insufficient documentation

## 2020-04-17 DIAGNOSIS — C50919 Malignant neoplasm of unspecified site of unspecified female breast: Secondary | ICD-10-CM | POA: Diagnosis not present

## 2020-04-17 DIAGNOSIS — Z9221 Personal history of antineoplastic chemotherapy: Secondary | ICD-10-CM | POA: Diagnosis not present

## 2020-04-17 DIAGNOSIS — I10 Essential (primary) hypertension: Secondary | ICD-10-CM | POA: Insufficient documentation

## 2020-04-17 LAB — CBC WITH DIFFERENTIAL (CANCER CENTER ONLY)
Abs Immature Granulocytes: 0.01 10*3/uL (ref 0.00–0.07)
Basophils Absolute: 0 10*3/uL (ref 0.0–0.1)
Basophils Relative: 1 %
Eosinophils Absolute: 0.1 10*3/uL (ref 0.0–0.5)
Eosinophils Relative: 3 %
HCT: 30.7 % — ABNORMAL LOW (ref 36.0–46.0)
Hemoglobin: 10.3 g/dL — ABNORMAL LOW (ref 12.0–15.0)
Immature Granulocytes: 0 %
Lymphocytes Relative: 41 %
Lymphs Abs: 1 10*3/uL (ref 0.7–4.0)
MCH: 36 pg — ABNORMAL HIGH (ref 26.0–34.0)
MCHC: 33.6 g/dL (ref 30.0–36.0)
MCV: 107.3 fL — ABNORMAL HIGH (ref 80.0–100.0)
Monocytes Absolute: 0.2 10*3/uL (ref 0.1–1.0)
Monocytes Relative: 8 %
Neutro Abs: 1.2 10*3/uL — ABNORMAL LOW (ref 1.7–7.7)
Neutrophils Relative %: 47 %
Platelet Count: 68 10*3/uL — ABNORMAL LOW (ref 150–400)
RBC: 2.86 MIL/uL — ABNORMAL LOW (ref 3.87–5.11)
RDW: 13.1 % (ref 11.5–15.5)
WBC Count: 2.5 10*3/uL — ABNORMAL LOW (ref 4.0–10.5)
nRBC: 0 % (ref 0.0–0.2)

## 2020-04-17 LAB — CMP (CANCER CENTER ONLY)
ALT: 58 U/L — ABNORMAL HIGH (ref 0–44)
AST: 79 U/L — ABNORMAL HIGH (ref 15–41)
Albumin: 3.6 g/dL (ref 3.5–5.0)
Alkaline Phosphatase: 279 U/L — ABNORMAL HIGH (ref 38–126)
Anion gap: 9 (ref 5–15)
BUN: 16 mg/dL (ref 8–23)
CO2: 27 mmol/L (ref 22–32)
Calcium: 9.6 mg/dL (ref 8.9–10.3)
Chloride: 105 mmol/L (ref 98–111)
Creatinine: 0.95 mg/dL (ref 0.44–1.00)
GFR, Est AFR Am: >60 mL/min (ref >60)
GFR, Estimated: >60 mL/min (ref >60)
Glucose, Bld: 125 mg/dL — ABNORMAL HIGH (ref 70–99)
Potassium: 3.8 mmol/L (ref 3.5–5.1)
Sodium: 141 mmol/L (ref 135–145)
Total Bilirubin: 0.6 mg/dL (ref 0.3–1.2)
Total Protein: 7 g/dL (ref 6.5–8.1)

## 2020-04-17 MED ORDER — SODIUM CHLORIDE 0.9% FLUSH
10.0000 mL | INTRAVENOUS | Status: DC | PRN
  Administered 2020-04-17: 10 mL
  Filled 2020-04-17: qty 10

## 2020-04-17 MED ORDER — TRASTUZUMAB-DKST CHEMO 150 MG IV SOLR
6.0000 mg/kg | Freq: Once | INTRAVENOUS | Status: AC
  Administered 2020-04-17: 399 mg via INTRAVENOUS
  Filled 2020-04-17: qty 19

## 2020-04-17 MED ORDER — ACETAMINOPHEN 325 MG PO TABS
650.0000 mg | ORAL_TABLET | Freq: Once | ORAL | Status: AC
  Administered 2020-04-17: 650 mg via ORAL

## 2020-04-17 MED ORDER — HEPARIN SOD (PORK) LOCK FLUSH 100 UNIT/ML IV SOLN
500.0000 [IU] | Freq: Once | INTRAVENOUS | Status: AC | PRN
  Administered 2020-04-17: 500 [IU]
  Filled 2020-04-17: qty 5

## 2020-04-17 MED ORDER — DIPHENHYDRAMINE HCL 25 MG PO CAPS
ORAL_CAPSULE | ORAL | Status: AC
  Filled 2020-04-17: qty 2

## 2020-04-17 MED ORDER — DIPHENHYDRAMINE HCL 25 MG PO CAPS
50.0000 mg | ORAL_CAPSULE | Freq: Once | ORAL | Status: AC
  Administered 2020-04-17: 50 mg via ORAL

## 2020-04-17 MED ORDER — ACETAMINOPHEN 325 MG PO TABS
ORAL_TABLET | ORAL | Status: AC
  Filled 2020-04-17: qty 2

## 2020-04-17 MED ORDER — SODIUM CHLORIDE 0.9% FLUSH
10.0000 mL | Freq: Once | INTRAVENOUS | Status: AC
  Administered 2020-04-17: 10 mL
  Filled 2020-04-17: qty 10

## 2020-04-17 MED ORDER — SODIUM CHLORIDE 0.9 % IV SOLN
Freq: Once | INTRAVENOUS | Status: AC
  Filled 2020-04-17: qty 250

## 2020-04-17 MED ORDER — TRIAMCINOLONE ACETONIDE 0.025 % EX OINT: 1.0000 "application " | TOPICAL_OINTMENT | Freq: Two times a day (BID) | CUTANEOUS | 0 refills | Status: DC

## 2020-04-17 NOTE — Assessment & Plan Note (Addendum)
Current treatment: Abemaciclibwith letrozole along with Herceptin(to be given every 4 weeks)and Xgeva started 06/15/2018  CT CAP 01/02/2020: Widespread sclerotic bone metastases.  Unchanged from prior exam.  No other soft tissue metastases. 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  [name] Riley is doing well today.  She is tolerating her treatment with letrozole, Abemaciclib, Herceptin, and Xgeva.  She did have some itching after her most recent Herceptin and Xgeva.  If this happens again, and if she develops a rash, I have asked her to take a picture of it for [name].  I also sent in some Kenalog cream if needed.    Arbie Cookey and I reviewed her echocardiogram results.  We also reviewed her upcoming plan where se will undergo restaging in August with CT chest abdomen and pelvis, along with bone scan.  She is due for MR brain in 04/2020.    I told Velma that I do not recommend she restart her baby aspirin at this point.  Her platelets today are 68.  She will return on 05/15/2020 for labs and Herceptin.  She knows to call for any questions that happen prior to then, we can always see her sooner if needed.

## 2020-04-17 NOTE — Progress Notes (Signed)
Per Wilber Bihari NP ok for treatment today with platelets 68 and Neutro abs 1.2

## 2020-04-17 NOTE — Progress Notes (Signed)
[facility] Follow up:    [name] Fraise, MD [place] [address]   DIAGNOSIS: Cancer Staging Breast cancer of upper-outer quadrant of left female breast [facility]) Staging form: Breast, AJCC 7th Edition - Clinical: Stage IIB (T2, N1, cM0) - Unsigned - Pathologic: No stage assigned - Unsigned - Pathologic stage from 06/22/2017: Stage IV ([place], [place], M1) - Signed by Nicholas Lose, MD on 12/27/2019   SUMMARY OF ONCOLOGIC HISTORY: Oncology History  Breast cancer of upper-outer quadrant of left female breast ([place])  11/14/2011 Surgery   Bilateral mastectomy, prophylactic on the right, left breast IDC 3/18 lymph nodes positive with extracapsular extension ER 89%, PR 81%, HER-2 negative, Ki-67 79% T2 N1 A. stage IIB   12/13/2011 - 06/28/2012 Chemotherapy   4 cycles of FEC followed by 4 cycles of Taxotere   07/17/2012 - 08/22/2012 Radiation Therapy   Adjuvant radiation therapy   08/22/2012 - 03/16/2016 Anti-estrogen oral therapy   Arimidex 1 mg daily   03/16/2016 Relapse/Recurrence   Subcutaneous nodule excision left chest: Infiltrating carcinoma breast primary, ER positive, PR negative   03/29/2016 Imaging   CT CAP and bone scan: Lytic lesions T8 vertebral, T1 posterior element, subcutaneous nodule left lateral chest wall, nonspecific lung nodules; Bone scan: Mets to kull, left humerus, left eighth rib, T7/T8, sternum, left acetabulum   04/28/2016 - 06/17/2017 Chemotherapy   Herceptin, lapatinib, Faslodex, Zometa every 4 weeks, lapatinib discontinued in September 2018 due to elevation of LFTs   06/22/2017 Relapse/Recurrence   Surgical excision:Soft tissue mass left lateral chest wall primary breast cancer, soft tissue mass left medial chest wall breast cancer, tumor is within the dermis extending to the subcutaneous adipose tissue and involves portions of skeletal muscle   06/22/2017 Cancer Staging   Staging form: Breast, AJCC 7th Edition - Pathologic stage from 06/22/2017:  Stage IV ([place], NX, M1) - Signed by Nicholas Lose, MD on 12/27/2019   08/2017 - 02/16/2018 Chemotherapy   Faslodex with Herceptin and Perjeta along with Zometa every 4 weeks    03/02/2018 - 05/25/2018 Chemotherapy   [place]    05/11/2018 Imaging   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 Associated vasogenic edema within the underlying right cerebral hemisphere without significant midline shift. Signal abnormality throughout the visualized bone marrow, compatible with osseous metastatic disease.      06/04/2018 Imaging   CT CAP: Right lower lobe lung nodule 7 mm (was 5 mm); multiple bone metastases throughout the spine and ribs sternum scapula and humerus, slightly increased lower thoracic mets, right renal lesion 2.5 cm (was 1.2 cm) right femur met increased from 2.1 cm to 2.9 cm   06/15/2018 -  Anti-estrogen oral therapy   Abemaciclib, Herceptin, letrozole, Xgeva   06/07/2019 Imaging   Progression of brain metastases,2 discrete dural-based lesions involving the posterior right frontal lobe. The more anterior lesion now measures 16.5 x 17.5 x 14 mm. The posterior lesion now measures 9.5 x 13 x 20 mm.  Vasogenic edema of the frontal and parietal lobes   06/21/2019 - 06/27/2019 Radiation Therapy   SRS to the brain   Metastatic breast cancer ([place])  06/29/2017 Initial Diagnosis   Metastatic breast cancer ([place])   06/15/2018 -  Chemotherapy   The patient had trastuzumab (HERCEPTIN) 546 mg in sodium chloride 0.9 % 250 mL chemo infusion, 8 mg/kg = 546 mg, Intravenous,  Once, 13 of 13 cycles Administration: 546 mg (06/15/2018), 399 mg (09/07/2018), 399 mg (07/13/2018), 399 mg (10/12/2018), 399  mg (11/02/2018), 399 mg (11/30/2018), 399 mg (01/24/2019), 399 mg (12/28/2018), 399 mg (02/22/2019), 399 mg (03/22/2019), 399 mg (04/17/2019), 399 mg (08/10/2018), 399 mg (05/17/2019) trastuzumab-dkst (OGIVRI) 399 mg in sodium chloride 0.9 % 250 mL chemo infusion, 6 mg/kg = 399 mg (100 % of original dose  6 mg/kg), Intravenous,  Once, 11 of 17 cycles Dose modification: 6 mg/kg (original dose 6 mg/kg, Cycle 14, Reason: Other (see comments), Comment: Biosimilar Conversion) Administration: 399 mg (06/14/2019), 399 mg (07/12/2019), 399 mg (08/09/2019), 399 mg (09/06/2019), 399 mg (10/04/2019), 399 mg (11/01/2019), 399 mg (11/29/2019), 399 mg (12/27/2019), 399 mg (01/24/2020), 399 mg (02/21/2020), 399 mg (03/20/2020)  for chemotherapy treatment.      CURRENT THERAPY: Xgeva every 12 weeks, Herceptin every 4 weeks, Abemaciclib, Letrozole  INTERVAL HISTORY: [name] Riley 69 y.o. female returns for evaluation of her metastatic breast cancer.    She is taking Abemaclib at 144m po BID and is not having any further diarrhea.  She has mild fatigue.    She takes Letrozole daily.  She has mild arthralgias, but otherwise is tolerating this well.  She also continues on Herceptin every 4 weeks.  With her most recent treatment she had some itching around her neck.  She says that it was in the area where her shingles was.  She said that it was small tiny bumps.  She put hydrocortisone cream on it, and it lasted a couple of weeks, it has resolved today.    Else's most recent echocardiogram was completed on 04/07/2020 and showed an EF of 55-60%.    She receives XNigerevery 12 weeks, and her most recent dose was given on 03/20/2020.    A few months ago CChauntelstopped the baby aspirin and wants to know if she can restart it.     Patient Active Problem List   Diagnosis Date Noted  . Brain metastasis ([place] 07/13/2018  . Goals of care, counseling/discussion 06/05/2018  . UTI (urinary tract infection) 05/31/2018  . Sepsis secondary to UTI ([facility] 05/31/2018  . Thrombocytopenia ([facility] 05/31/2018  . Hypokalemia 05/31/2018  . Port-A-Cath in place 03/23/2018  . Elevated LFTs   . Metastatic breast cancer ([place] 06/29/2017  . Nodule of skin of breast 03/06/2017  . Abscess, abdomen 07/06/2016  . Metastasis to bone ([place]  06/01/2016  . Pneumothorax on left 04/25/2016  . Chest pain 10/23/2014  . Radiation-induced dermatitis 07/27/2012  . Hypertension   . Breast cancer of upper-outer quadrant of left female breast ([facility] 09/01/2011  . Hypothyroidism 06/12/2009  . Mixed hyperlipidemia 06/12/2009  . Essential hypertension 06/12/2009  . Coronary atherosclerosis 06/12/2009  . DIZZINESS 06/12/2009  . PALPITATIONS 06/12/2009    is allergic to cantaloupe extract allergy skin test, contrast media [iodinated diagnostic agents], pravastatin, and zosyn [piperacillin sod-tazobactam so].  MEDICAL HISTORY: Past Medical History:  Diagnosis Date  . Allergy   . Breast cancer ([place] 08/25/2011   L , invasive ductal carcinoma, ER/PR +,HER2 -  . Cancer ([facility]    left breast cancer  . Coronary artery disease 2001  . Heart attack ([facility] 09/2000   Sep 25, 2000  --no intervention  . History of chemotherapy comp. 08/22/2012   4 cycles of FEC and $ cycles of Taxotere  . Hyperlipidemia   . Hypertension   . Hypothyroidism   . PONV (postoperative nausea and vomiting)    gets sick from anesthesia  . Status post radiation therapy 07/09/12 - 08/22/2012   Left Breast, 60.4 gray    SURGICAL  HISTORY: Past Surgical History:  Procedure Laterality Date  . ABDOMINAL HYSTERECTOMY  1998   TAH, oophorectomy  . APPENDECTOMY  1970  . BREAST SURGERY  1998   removal of benign lump in rt breast  . BREAST SURGERY  11/14/11   right simple mastectomy, left mrm  . INCISION AND DRAINAGE OF WOUND Left 07/01/2017   Procedure: IRRIGATION AND DEBRIDEMENT CHEST WALL ABSCESS;  Surgeon: Donnie Mesa, MD;  Location: [facility];  Service: General;  Laterality: Left;  Marland Kitchen MASS EXCISION Left 06/22/2017   Procedure: EXCISION OF CHEST WALL MASSES;  Surgeon: Donnie Mesa, MD;  Location: [place];  Service: General;  Laterality: Left;  Marland Kitchen MASTECTOMY Bilateral    for left breast cancer  . OVARIAN CYST SURGERY Right 1970  . PORT-A-CATH REMOVAL  08/30/2012    Procedure: REMOVAL PORT-A-CATH;  Surgeon: Imogene Burn. [name], MD;  Location: [place];  Service: General;  Laterality: Right;  port removal  . PORTACATH PLACEMENT  11/14/2011   Procedure: INSERTION PORT-A-CATH;  Surgeon: Imogene Burn. [name], MD;  Location: [place];  Service: General;  Laterality: Right;  . PORTACATH PLACEMENT N/A 04/25/2016   Procedure: INSERTION PORT-A-CATH LEFT CHEST;  Surgeon: Donnie Mesa, MD;  Location: [place];  Service: General;  Laterality: N/A;  . skin tags  05/09/1997   left axillary left neck skin tags  . TONSILLECTOMY  1968    SOCIAL HISTORY: Social History   Socioeconomic History  . Marital status: Married    Spouse name: Not on file  . Number of children: 3  . Years of education: Not on file  . Highest education level: High school graduate  Occupational History  . Occupation: Works at [employer]  Tobacco Use  . Smoking status: Never Smoker  . Smokeless tobacco: Never Used  Vaping Use  . Vaping Use: Never used  Substance and Sexual Activity  . Alcohol use: No  . Drug use: No  . Sexual activity: Yes    Birth control/protection: Surgical    Comment: menarche 64, Parity age 20, G63, P3, 1 miscarriage,  HRT x 5-10 yrs, Mild Hot Flashes  Other Topics Concern  . Not on file  Social History Narrative   Lives at home.   Social Determinants of Health   Financial Resource Strain:   . Difficulty of Paying Living Expenses:   Food Insecurity:   . Worried About Charity fundraiser in the Last Year:   . Arboriculturist in the Last Year:   Transportation Needs: No Transportation Needs  . Lack of Transportation (Medical): No  . Lack of Transportation (Non-Medical): No  Physical Activity:   . Days of Exercise per Week:   . Minutes of Exercise per Session:   Stress:   . Feeling of Stress :   Social Connections:   . Frequency of Communication with Friends and Family:   . Frequency of Social Gatherings with Friends and  Family:   . Attends Religious Services:   . Active Member of Clubs or Organizations:   . Attends Archivist Meetings:   Marland Kitchen Marital Status:   Intimate Partner Violence:   . Fear of Current or Ex-Partner:   . Emotionally Abused:   Marland Kitchen Physically Abused:   . Sexually Abused:     FAMILY HISTORY: Family History  Problem Relation Age of Onset  . Hypertension Maternal Grandmother   . Diabetes Maternal Grandmother   . Cancer Father 27  lung cancer and Prostate Cancer  . Hypertension Mother   . Cancer Paternal Aunt        ovarian  . Cancer Cousin        breast, paternal cousin  . Cancer Paternal Uncle        stomach  . Cancer Paternal Grandfather        Esophagus  . Colon cancer Neg Hx     Review of Systems  Constitutional: Positive for fatigue. Negative for appetite change, chills, fever and unexpected weight change.  HENT:   Negative for hearing loss and lump/mass.   Eyes: Negative for eye problems and icterus.  Respiratory: Negative for chest tightness, cough and shortness of breath.   Cardiovascular: Negative for chest pain, leg swelling and palpitations.  Gastrointestinal: Negative for abdominal distention, abdominal pain, constipation, diarrhea, nausea and vomiting.  Endocrine: Negative for hot flashes.  Genitourinary: Negative for difficulty urinating.   Musculoskeletal: Negative for arthralgias.  Skin: Positive for itching (resolved today) and rash (resolved today).  Neurological: Negative for dizziness, extremity weakness, headaches and numbness.  Hematological: Negative for adenopathy. Does not bruise/bleed easily.  Psychiatric/Behavioral: Negative for depression. The patient is not nervous/anxious.       PHYSICAL EXAMINATION  ECOG PERFORMANCE STATUS: 1 - Symptomatic but completely ambulatory  Vitals:   04/17/20 0816  BP: (!) 156/95  Pulse: 71  Resp: 18  Temp: 98.3 F (36.8 C)  SpO2: 100%    Physical Exam Constitutional:      General: She  is not in acute distress.    Appearance: Normal appearance. She is not toxic-appearing.  HENT:     Head: Normocephalic and atraumatic.  Eyes:     General: No scleral icterus. Cardiovascular:     Rate and Rhythm: Normal rate and regular rhythm.     Pulses: Normal pulses.     Heart sounds: Normal heart sounds.  Pulmonary:     Effort: Pulmonary effort is normal. No respiratory distress.     Breath sounds: Normal breath sounds. No wheezing.  Abdominal:     General: Abdomen is flat. Bowel sounds are normal. There is no distension.     Palpations: Abdomen is soft.     Tenderness: There is no abdominal tenderness.  Musculoskeletal:        General: No swelling.  Skin:    General: Skin is warm and dry.     Findings: No rash.  Neurological:     General: No focal deficit present.     Mental Status: She is alert and oriented to person, place, and time.  Psychiatric:        Mood and Affect: Mood normal.        Behavior: Behavior normal.     LABORATORY DATA:  CBC    Component Value Date/Time   WBC 2.5 (L) 04/17/2020 0802   WBC 4.3 07/01/2018 1036   RBC 2.86 (L) 04/17/2020 0802   HGB 10.3 (L) 04/17/2020 0802   HGB 10.6 (L) 12/12/2019 0800   HGB 11.5 (L) 09/22/2017 0958   HCT 30.7 (L) 04/17/2020 0802   HCT 31.4 (L) 12/12/2019 0800   HCT 34.9 09/22/2017 0958   PLT 68 (L) 04/17/2020 0802   PLT 82 (LL) 12/12/2019 0800   MCV 107.3 (H) 04/17/2020 0802   MCV 105 (H) 12/12/2019 0800   MCV 94.3 09/22/2017 0958   MCH 36.0 (H) 04/17/2020 0802   MCHC 33.6 04/17/2020 0802   RDW 13.1 04/17/2020 0802   RDW 14.4 12/12/2019 0800  RDW 15.3 (H) 09/22/2017 0958   LYMPHSABS 1.0 04/17/2020 0802   LYMPHSABS 1.5 12/12/2019 0800   LYMPHSABS 2.2 09/22/2017 0958   MONOABS 0.2 04/17/2020 0802   MONOABS 0.8 09/22/2017 0958   EOSABS 0.1 04/17/2020 0802   EOSABS 0.1 12/12/2019 0800   BASOSABS 0.0 04/17/2020 0802   BASOSABS 0.1 12/12/2019 0800   BASOSABS 0.0 09/22/2017 0958    CMP      Component Value Date/Time   NA 141 04/17/2020 0802   NA 140 01/23/2020 0857   NA 140 09/22/2017 0958   K 3.8 04/17/2020 0802   K 3.7 09/22/2017 0958   CL 105 04/17/2020 0802   CL 105 08/10/2012 0908   CO2 27 04/17/2020 0802   CO2 26 09/22/2017 0958   GLUCOSE 125 (H) 04/17/2020 0802   GLUCOSE 80 09/22/2017 0958   GLUCOSE 81 08/10/2012 0908   BUN 16 04/17/2020 0802   BUN 15 01/23/2020 0857   BUN 12.8 09/22/2017 0958   CREATININE 0.95 04/17/2020 0802   CREATININE 0.6 09/22/2017 0958   CALCIUM 9.6 04/17/2020 0802   CALCIUM 9.1 09/22/2017 0958   PROT 7.0 04/17/2020 0802   PROT 6.5 12/12/2019 0800   PROT 7.0 09/22/2017 0958   ALBUMIN 3.6 04/17/2020 0802   ALBUMIN 4.2 12/12/2019 0800   ALBUMIN 4.0 09/22/2017 0958   AST 79 (H) 04/17/2020 0802   AST 37 (H) 09/22/2017 0958   ALT 58 (H) 04/17/2020 0802   ALT 31 09/22/2017 0958   ALKPHOS 279 (H) 04/17/2020 0802   ALKPHOS 140 09/22/2017 0958   BILITOT 0.6 04/17/2020 0802   BILITOT 0.41 09/22/2017 0958   GFRNONAA >60 04/17/2020 0802   GFRAA >60 04/17/2020 0802         ASSESSMENT and PLAN:   Breast cancer of upper-outer quadrant of left female breast ([place]) Current treatment: Abemaciclibwith letrozole along with Herceptin(to be given every 4 weeks)and Xgeva started 06/15/2018  CT CAP 01/02/2020: Widespread sclerotic bone metastases.  Unchanged from prior exam.  No other soft tissue metastases. 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  [name] Riley is doing well today.  She is tolerating her treatment with letrozole, Abemaciclib, Herceptin, and Xgeva.  She did have some itching after her most recent Herceptin and Xgeva.  If this happens again, and if she develops a rash, I have asked her to take a picture of it for [name].  I also sent in some Kenalog cream if needed.    [name] Riley and I reviewed her echocardiogram results.  We also reviewed her upcoming plan where se will undergo restaging in August  with CT chest abdomen and pelvis, along with bone scan.  She is due for MR brain in 04/2020.    I told [name] Riley that I do not recommend she restart her baby aspirin at this point.  Her platelets today are 68.  She will return on 05/15/2020 for labs and Herceptin.  She knows to call for any questions that happen prior to then, we can always see her sooner if needed.     Orders Placed This Encounter  Procedures  . CT Chest W Contrast    Standing Status:   Future    Standing Expiration Date:   04/17/2021    Order Specific Question:   If indicated for the ordered procedure, I authorize the administration of contrast media per Radiology protocol    Answer:   Yes    Order Specific Question:   Preferred imaging location?  Answer:   [facility]    Order Specific Question:   Radiology Contrast Protocol - do NOT remove file path    Answer:   \\[email or web address].[email or web address]  . CT Abdomen Pelvis W Contrast    Standing Status:   Future    Standing Expiration Date:   04/17/2021    Order Specific Question:   If indicated for the ordered procedure, I authorize the administration of contrast media per Radiology protocol    Answer:   Yes    Order Specific Question:   Preferred imaging location?    Answer:   [facility]    Order Specific Question:   Is Oral Contrast requested for this exam?    Answer:   Yes, Per Radiology protocol    Order Specific Question:   Radiology Contrast Protocol - do NOT remove file path    Answer:   \\[email or web address].pdf  . NM Bone Scan Whole Body    Standing Status:   Future    Standing Expiration Date:   04/17/2021    Order Specific Question:   If indicated for the ordered procedure, I authorize the administration of a radiopharmaceutical per Radiology protocol    Answer:   Yes    Order Specific Question:   Preferred imaging location?    Answer:   [facility]    Order Specific Question:   Radiology Contrast Protocol -  do NOT remove file path    Answer:   \\[email or web address]    All questions were answered. The patient knows to call the clinic with any problems, questions or concerns. We can certainly see the patient much sooner if necessary.  Total encounter time: 30 minutes*  Wilber Bihari, NP 04/17/20 8:52 AM Medical Oncology and Hematology [facility] [place], [place] [address] Tel. [phone]    Fax. [phone]  *Total Encounter Time as defined by the Centers for Medicare and Medicaid Services includes, in addition to the face-to-face time of a patient visit (documented in the note above) non-face-to-face time: obtaining and reviewing outside history, ordering and reviewing medications, tests or procedures, care coordination (communications with other health care professionals or caregivers) and documentation in the medical record.

## 2020-04-17 NOTE — Patient Instructions (Signed)
[facility] Discharge Instructions for Patients Receiving Chemotherapy  Today you received the following immunotherapy agent: Trastuzumab  To help prevent nausea and vomiting after your treatment, we encourage you to take your nausea medication as directed by your MD.   If you develop nausea and vomiting that is not controlled by your nausea medication, call the clinic.   BELOW ARE SYMPTOMS THAT SHOULD BE REPORTED IMMEDIATELY:  *FEVER GREATER THAN 100.5 F  *CHILLS WITH OR WITHOUT FEVER  NAUSEA AND VOMITING THAT IS NOT CONTROLLED WITH YOUR NAUSEA MEDICATION  *UNUSUAL SHORTNESS OF BREATH  *UNUSUAL BRUISING OR BLEEDING  TENDERNESS IN MOUTH AND THROAT WITH OR WITHOUT PRESENCE OF ULCERS  *URINARY PROBLEMS  *BOWEL PROBLEMS  UNUSUAL RASH Items with * indicate a potential emergency and should be followed up as soon as possible.  Feel free to call the clinic should you have any questions or concerns. The clinic phone number is [phone].  Please show the CHEMO ALERT CARD at check-in to the Emergency Department and triage nurse.   

## 2020-04-21 ENCOUNTER — Telehealth: Payer: Self-pay | Admitting: Adult Health

## 2020-04-21 NOTE — Telephone Encounter (Signed)
Scheduled appts per 7/2 los. Pt confirmed appt date and time.  

## 2020-05-01 ENCOUNTER — Ambulatory Visit: Payer: Medicare Other | Admitting: Hematology and Oncology

## 2020-05-01 ENCOUNTER — Other Ambulatory Visit: Payer: Self-pay

## 2020-05-01 ENCOUNTER — Ambulatory Visit (HOSPITAL_COMMUNITY)
Admission: RE | Admit: 2020-05-01 | Discharge: 2020-05-01 | Disposition: A | Discharge status: Home / Self Care | Payer: Medicare Other | Source: Ambulatory Visit | Attending: Internal Medicine | Admitting: Internal Medicine

## 2020-05-01 ENCOUNTER — Ambulatory Visit: Payer: Medicare Other

## 2020-05-01 ENCOUNTER — Other Ambulatory Visit: Payer: Medicare Other

## 2020-05-01 ENCOUNTER — Inpatient Hospital Stay: Payer: Medicare Other

## 2020-05-01 DIAGNOSIS — Z5112 Encounter for antineoplastic immunotherapy: Secondary | ICD-10-CM | POA: Diagnosis not present

## 2020-05-01 DIAGNOSIS — C7931 Secondary malignant neoplasm of brain: Secondary | ICD-10-CM | POA: Insufficient documentation

## 2020-05-01 DIAGNOSIS — C7951 Secondary malignant neoplasm of bone: Secondary | ICD-10-CM

## 2020-05-01 DIAGNOSIS — Z95828 Presence of other vascular implants and grafts: Secondary | ICD-10-CM

## 2020-05-01 DIAGNOSIS — Z170 Estrogen receptor positive status [ER+]: Secondary | ICD-10-CM

## 2020-05-01 DIAGNOSIS — C50412 Malignant neoplasm of upper-outer quadrant of left female breast: Secondary | ICD-10-CM

## 2020-05-01 MED ORDER — HEPARIN SOD (PORK) LOCK FLUSH 100 UNIT/ML IV SOLN
500.0000 [IU] | INTRAVENOUS | Status: AC | PRN
  Administered 2020-05-01: 500 [IU]
  Filled 2020-05-01: qty 5

## 2020-05-01 MED ORDER — GADOBUTROL 1 MMOL/ML IV SOLN
6.4000 mL | Freq: Once | INTRAVENOUS | Status: AC | PRN
  Administered 2020-05-01: 6.4 mL via INTRAVENOUS

## 2020-05-01 MED ORDER — SODIUM CHLORIDE 0.9% FLUSH
10.0000 mL | Freq: Once | INTRAVENOUS | Status: AC
  Administered 2020-05-01: 10 mL
  Filled 2020-05-01: qty 10

## 2020-05-01 MED FILL — VERZENIO 100 MG TAB: 100 | 28 days supply | Qty: 56 | Fill #2

## 2020-05-01 NOTE — Patient Instructions (Signed)

## 2020-05-04 ENCOUNTER — Inpatient Hospital Stay (HOSPITAL_BASED_OUTPATIENT_CLINIC_OR_DEPARTMENT_OTHER): Payer: Medicare Other | Admitting: Internal Medicine

## 2020-05-04 ENCOUNTER — Other Ambulatory Visit: Payer: Self-pay

## 2020-05-04 ENCOUNTER — Inpatient Hospital Stay: Payer: Medicare Other

## 2020-05-04 ENCOUNTER — Other Ambulatory Visit: Payer: Self-pay | Admitting: Hematology and Oncology

## 2020-05-04 VITALS — BP 160/87 | HR 76 | Temp 97.9°F | Resp 18 | Wt 141.0 lb

## 2020-05-04 DIAGNOSIS — C7931 Secondary malignant neoplasm of brain: Secondary | ICD-10-CM | POA: Diagnosis not present

## 2020-05-04 DIAGNOSIS — Z5112 Encounter for antineoplastic immunotherapy: Secondary | ICD-10-CM | POA: Diagnosis not present

## 2020-05-04 NOTE — Progress Notes (Signed)
[facility] [place], [place] [address] [phone]   Interval Evaluation  Date of Service: 05/04/20 Patient Name: [name] Riley Patient MRN: [id number] Patient DOB: 1951/02/17 Provider: Ventura Sellers, MD  Identifying Statement:  [name] is a 69 y.o. female with seizures, brain metastases  Primary Cancer:  Oncologic History: Oncology History  Breast cancer of upper-outer quadrant of left female breast ([place])  11/14/2011 Surgery   Bilateral mastectomy, prophylactic on the right, left breast IDC 3/18 lymph nodes positive with extracapsular extension ER 89%, PR 81%, HER-2 negative, Ki-67 79% T2 N1 A. stage IIB   12/13/2011 - 06/28/2012 Chemotherapy   4 cycles of FEC followed by 4 cycles of Taxotere   07/17/2012 - 08/22/2012 Radiation Therapy   Adjuvant radiation therapy   08/22/2012 - 03/16/2016 Anti-estrogen oral therapy   Arimidex 1 mg daily   03/16/2016 Relapse/Recurrence   Subcutaneous nodule excision left chest: Infiltrating carcinoma breast primary, ER positive, PR negative   03/29/2016 Imaging   CT CAP and bone scan: Lytic lesions T8 vertebral, T1 posterior element, subcutaneous nodule left lateral chest wall, nonspecific lung nodules; Bone scan: Mets to kull, left humerus, left eighth rib, T7/T8, sternum, left acetabulum   04/28/2016 - 06/17/2017 Chemotherapy   Herceptin, lapatinib, Faslodex, Zometa every 4 weeks, lapatinib discontinued in September 2018 due to elevation of LFTs   06/22/2017 Relapse/Recurrence   Surgical excision:Soft tissue mass left lateral chest wall primary breast cancer, soft tissue mass left medial chest wall breast cancer, tumor is within the dermis extending to the subcutaneous adipose tissue and involves portions of skeletal muscle   06/22/2017 Cancer Staging   Staging form: Breast, AJCC 7th Edition - Pathologic stage from 06/22/2017: Stage IV (TX, NX, M1) - Signed by Nicholas Lose, MD on  12/27/2019   08/2017 - 02/16/2018 Chemotherapy   Faslodex with Herceptin and Perjeta along with Zometa every 4 weeks    03/02/2018 - 05/25/2018 Chemotherapy   Kad[place]la    05/11/2018 Imaging   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 Associated vasogenic edema within the underlying right cerebral hemisphere without significant midline shift. Signal abnormality throughout the visualized bone marrow, compatible with osseous metastatic disease.      06/04/2018 Imaging   CT CAP: Right lower lobe lung nodule 7 mm (was 5 mm); multiple bone metastases throughout the spine and ribs sternum scapula and humerus, slightly increased lower thoracic mets, right renal lesion 2.5 cm (was 1.2 cm) right femur met increased from 2.1 cm to 2.9 cm   06/15/2018 -  Anti-estrogen oral therapy   Abemaciclib, Herceptin, letrozole, Xgeva   06/07/2019 Imaging   Progression of brain metastases,2 discrete dural-based lesions involving the posterior right frontal lobe. The more anterior lesion now measures 16.5 x 17.5 x 14 mm. The posterior lesion now measures 9.5 x 13 x 20 mm.  Vasogenic edema of the frontal and parietal lobes   06/21/2019 - 06/27/2019 Radiation Therapy   SRS to the brain   Metastatic breast cancer ([place])  06/29/2017 Initial Diagnosis   Metastatic breast cancer ([place])   06/15/2018 -  Chemotherapy   The patient had trastuzumab (HERCEPTIN) 546 mg in sodium chloride 0.9 % 250 mL chemo infusion, 8 mg/kg = 546 mg, Intravenous,  Once, 13 of 13 cycles Administration: 546 mg (06/15/2018), 399 mg (09/07/2018), 399 mg (07/13/2018), 399 mg (10/12/2018), 399 mg (11/02/2018), 399 mg (11/30/2018), 399 mg (01/24/2019), 399 mg (12/28/2018), 399 mg (02/22/2019), 399 mg (  03/22/2019), 399 mg (04/17/2019), 399 mg (08/10/2018), 399 mg (05/17/2019) trastuzumab-dkst (OGIVRI) 399 mg in sodium chloride 0.9 % 250 mL chemo infusion, 6 mg/kg = 399 mg (100 % of original dose 6 mg/kg), Intravenous,  Once, 12 of 17 cycles Dose  modification: 6 mg/kg (original dose 6 mg/kg, Cycle 14, Reason: Other (see comments), Comment: Biosimilar Conversion) Administration: 399 mg (06/14/2019), 399 mg (07/12/2019), 399 mg (08/09/2019), 399 mg (09/06/2019), 399 mg (10/04/2019), 399 mg (11/01/2019), 399 mg (11/29/2019), 399 mg (12/27/2019), 399 mg (01/24/2020), 399 mg (02/21/2020), 399 mg (03/20/2020), 399 mg (04/17/2020)  for chemotherapy treatment.      Interval History:  EMELY Riley presents today after recent MRI brain.  No change in motor function affecting the left side. No recurrence of seizures.  No additional headaches.  Medications: Current Outpatient Medications on File Prior to Visit  Medication Sig Dispense Refill  . abemaciclib (VERZENIO) 100 MG tablet Take 1 tablet (100 mg total) by mouth 2 (two) times daily. Swallow tablets whole. Do not chew, crush, or split tablets before swallowing. 60 tablet 3  . acetaminophen (TYLENOL) 500 MG tablet Take 1,000 mg by mouth every 6 (six) hours as needed for moderate pain.    . cholecalciferol (VITAMIN D) 1000 units tablet Take 1 tablet (1,000 Units total) by mouth daily.    . diphenhydrAMINE (BENADRYL) 25 MG tablet Take 50 mg by mouth at bedtime as needed for sleep.    Marland Kitchen letrozole (FEMARA) 2.5 MG tablet TAKE ONE (1) TABLET EACH DAY 90 tablet 3  . levETIRAcetam (KEPPRA) 500 MG tablet TAKE ONE TABLET BY MOUTH TWICE DAILY 60 tablet 2  . levothyroxine (SYNTHROID) 100 MCG tablet TAKE ONE TABLET EACH MORNING BEFORE BREAKFAST 90 tablet 3  . lidocaine-prilocaine (EMLA) cream APPLY TOPICALLY AS NEEDED FOR PORT ACCESS 30 g 3  . metoprolol succinate (TOPROL-XL) 100 MG 24 hr tablet Take 1 tablet (100 mg total) by mouth daily. 90 tablet 3  . Multiple Vitamins-Minerals (MULTIVITAMIN WITH MINERALS) tablet Take 1 tablet by mouth daily.      . ondansetron (ZOFRAN-ODT) 4 MG disintegrating tablet TAKE 1 TABLET EVERY 6 HOURS AS NEEDED FOR NAUSEA AND VOMITING 30 tablet 3  . predniSONE (DELTASONE) 50 MG tablet  For contrast dye allergy 3 tablet 0  . rosuvastatin (CRESTOR) 10 MG tablet Take 10 mg by mouth daily.    Marland Kitchen triamcinolone (KENALOG) 0.025 % ointment Apply 1 application topically 2 (two) times daily. 30 g 0  . valsartan (DIOVAN) 80 MG tablet Take 1 tablet (80 mg total) by mouth daily. for blood pressure 90 tablet 1   Current Facility-Administered Medications on File Prior to Visit  Medication Dose Route Frequency Provider Last Rate Last Admin  . 0.9 %  sodium chloride infusion   Intravenous Continuous Donnie Mesa, MD      . acetaminophen (TYLENOL) tablet 975 mg  975 mg Oral Q6H Donnie Mesa, MD      . enoxaparin (LOVENOX) injection 40 mg  40 mg Subcutaneous Q24H Donnie Mesa, MD      . morphine 4 MG/ML injection 2-4 mg  2-4 mg Intravenous Q2H PRN Donnie Mesa, MD      . ondansetron (ZOFRAN-ODT) disintegrating tablet 4 mg  4 mg Oral Q6H PRN Donnie Mesa, MD       Or  . ondansetron (ZOFRAN) 4 mg in sodium chloride 0.9 % 50 mL IVPB  4 mg Intravenous Q6H PRN Donnie Mesa, MD      . oxyCODONE (Oxy IR/ROXICODONE) immediate  release tablet 5-10 mg  5-10 mg Oral Q4H PRN Donnie Mesa, MD      . piperacillin-tazobactam (ZOSYN) 3.375 g in dextrose 5 % 50 mL IVPB  3.375 g Intravenous Q8H Donnie Mesa, MD      . prochlorperazine (COMPAZINE) tablet 10 mg  10 mg Oral Q6H PRN Donnie Mesa, MD       Or  . prochlorperazine (COMPAZINE) injection 5-10 mg  5-10 mg Intravenous Q6H PRN Donnie Mesa, MD        Allergies:  Allergies  Allergen Reactions  . Cantaloupe Extract Allergy Skin Test Shortness Of Breath  . Contrast Media [Iodinated Diagnostic Agents] Shortness Of Breath and Rash  . Pravastatin Other (See Comments)    Legs hurt  . Zosyn [Piperacillin Sod-Tazobactam So] Rash and Other (See Comments)    Temperature increase, facial flushing   Past Medical History:  Past Medical History:  Diagnosis Date  . Allergy   . Breast cancer ([place]) 08/25/2011   L , invasive ductal carcinoma,  ER/PR +,HER2 -  . Cancer [facility])    left breast cancer  . Coronary artery disease 2001  . Heart attack [facility]) 09/2000   Sep 25, 2000  --no intervention  . History of chemotherapy comp. 08/22/2012   4 cycles of FEC and $ cycles of Taxotere  . Hyperlipidemia   . Hypertension   . Hypothyroidism   . PONV (postoperative nausea and vomiting)    gets sick from anesthesia  . Status post radiation therapy 07/09/12 - 08/22/2012   Left Breast, 60.4 gray   Past Surgical History:  Past Surgical History:  Procedure Laterality Date  . ABDOMINAL HYSTERECTOMY  1998   TAH, oophorectomy  . APPENDECTOMY  1970  . BREAST SURGERY  1998   removal of benign lump in rt breast  . BREAST SURGERY  11/14/11   right simple mastectomy, left mrm  . INCISION AND DRAINAGE OF WOUND Left 07/01/2017   Procedure: IRRIGATION AND DEBRIDEMENT CHEST WALL ABSCESS;  Surgeon: Donnie Mesa, MD;  Location: [facility];  Service: General;  Laterality: Left;  Marland Kitchen MASS EXCISION Left 06/22/2017   Procedure: EXCISION OF CHEST WALL MASSES;  Surgeon: Donnie Mesa, MD;  Location: [place];  Service: General;  Laterality: Left;  Marland Kitchen MASTECTOMY Bilateral    for left breast cancer  . OVARIAN CYST SURGERY Right 1970  . PORT-A-CATH REMOVAL  08/30/2012   Procedure: REMOVAL PORT-A-CATH;  Surgeon: Imogene Burn. [name], MD;  Location: [place];  Service: General;  Laterality: Right;  port removal  . PORTACATH PLACEMENT  11/14/2011   Procedure: INSERTION PORT-A-CATH;  Surgeon: Imogene Burn. [name], MD;  Location: [place];  Service: General;  Laterality: Right;  . PORTACATH PLACEMENT N/A 04/25/2016   Procedure: INSERTION PORT-A-CATH LEFT CHEST;  Surgeon: Donnie Mesa, MD;  Location: [place];  Service: General;  Laterality: N/A;  . skin tags  05/09/1997   left axillary left neck skin tags  . TONSILLECTOMY  1968   Social History:  Social History   Socioeconomic History  . Marital status: Married    Spouse name: Not on file  .  Number of children: 3  . Years of education: Not on file  . Highest education level: High school graduate  Occupational History  . Occupation: Works at [employer]  Tobacco Use  . Smoking status: Never Smoker  . Smokeless tobacco: Never Used  Vaping Use  . Vaping Use: Never used  Substance and Sexual Activity  .  Alcohol use: No  . Drug use: No  . Sexual activity: Yes    Birth control/protection: Surgical    Comment: menarche 88, Parity age 84, G36, P3, 1 miscarriage,  HRT x 5-10 yrs, Mild Hot Flashes  Other Topics Concern  . Not on file  Social History Narrative   Lives at home.   Social Determinants of Health   Financial Resource Strain:   . Difficulty of Paying Living Expenses:   Food Insecurity:   . Worried About Charity fundraiser in the Last Year:   . Arboriculturist in the Last Year:   Transportation Needs: No Transportation Needs  . Lack of Transportation (Medical): No  . Lack of Transportation (Non-Medical): No  Physical Activity:   . Days of Exercise per Week:   . Minutes of Exercise per Session:   Stress:   . Feeling of Stress :   Social Connections:   . Frequency of Communication with Friends and Family:   . Frequency of Social Gatherings with Friends and Family:   . Attends Religious Services:   . Active Member of Clubs or Organizations:   . Attends Archivist Meetings:   Marland Kitchen Marital Status:   Intimate Partner Violence:   . Fear of Current or Ex-Partner:   . Emotionally Abused:   Marland Kitchen Physically Abused:   . Sexually Abused:    Family History:  Family History  Problem Relation Age of Onset  . Hypertension Maternal Grandmother   . Diabetes Maternal Grandmother   . Cancer Father 52       lung cancer and Prostate Cancer  . Hypertension Mother   . Cancer Paternal Aunt        ovarian  . Cancer Cousin        breast, paternal cousin  . Cancer Paternal Uncle        stomach  . Cancer Paternal Grandfather        Esophagus  . Colon cancer  Neg Hx     Review of Systems: Constitutional: Denies fevers, chills or abnormal weight loss Eyes: Denies blurriness of vision Ears, nose, mouth, throat, and face: Denies mucositis or sore throat Respiratory: Denies cough, dyspnea or wheezes Cardiovascular: Denies palpitation, chest discomfort or lower extremity swelling Gastrointestinal:  Denies nausea, constipation, diarrhea GU: Denies dysuria or incontinence Skin: Denies abnormal skin rashes Neurological: Per HPI Musculoskeletal: Denies joint pain, back or neck discomfort. No decrease in ROM Behavioral/Psych: Denies anxiety, disturbance in thought content, and mood instability   Physical Exam: Vitals:   05/04/20 0843  BP: (!) 160/87  Pulse: 76  Resp: 18  Temp: 97.9 F (36.6 C)  SpO2: 100%   KPS: 80. General: Alert, cooperative, pleasant, in no acute distress Head: Normal EENT: No conjunctival injection or scleral icterus. Oral mucosa moist Lungs: Resp effort normal Cardiac: Regular rate and rhythm Abdomen: Soft, non-distended abdomen Skin: No rashes cyanosis or petechiae. Extremities: No clubbing or edema  Neurologic Exam: Mental Status: Awake, alert, attentive to examiner. Oriented to self and environment. Language is fluent with intact comprehension.  Cranial Nerves: Visual acuity is grossly normal. Visual fields are full. Extra-ocular movements intact. No ptosis. Face is symmetric, tongue midline. Motor: Tone and bulk are normal. Arms and legs 5/5. Reflexes are symmetric, no pathologic reflexes present. Intact finger to nose bilaterally Sensory: Normal Gait: Normal and tandem gait is deferred   Labs: I have reviewed the data as listed    Component Value Date/Time   NA 141  04/17/2020 0802   NA 140 01/23/2020 0857   NA 140 09/22/2017 0958   K 3.8 04/17/2020 0802   K 3.7 09/22/2017 0958   CL 105 04/17/2020 0802   CL 105 08/10/2012 0908   CO2 27 04/17/2020 0802   CO2 26 09/22/2017 0958   GLUCOSE 125 (H)  04/17/2020 0802   GLUCOSE 80 09/22/2017 0958   GLUCOSE 81 08/10/2012 0908   BUN 16 04/17/2020 0802   BUN 15 01/23/2020 0857   BUN 12.8 09/22/2017 0958   CREATININE 0.95 04/17/2020 0802   CREATININE 0.6 09/22/2017 0958   CALCIUM 9.6 04/17/2020 0802   CALCIUM 9.1 09/22/2017 0958   PROT 7.0 04/17/2020 0802   PROT 6.5 12/12/2019 0800   PROT 7.0 09/22/2017 0958   ALBUMIN 3.6 04/17/2020 0802   ALBUMIN 4.2 12/12/2019 0800   ALBUMIN 4.0 09/22/2017 0958   AST 79 (H) 04/17/2020 0802   AST 37 (H) 09/22/2017 0958   ALT 58 (H) 04/17/2020 0802   ALT 31 09/22/2017 0958   ALKPHOS 279 (H) 04/17/2020 0802   ALKPHOS 140 09/22/2017 0958   BILITOT 0.6 04/17/2020 0802   BILITOT 0.41 09/22/2017 0958   GFRNONAA >60 04/17/2020 0802   GFRAA >60 04/17/2020 0802   Lab Results  Component Value Date   WBC 2.5 (L) 04/17/2020   NEUTROABS 1.2 (L) 04/17/2020   HGB 10.3 (L) 04/17/2020   HCT 30.7 (L) 04/17/2020   MCV 107.3 (H) 04/17/2020   PLT 68 (L) 04/17/2020     Imaging:  [facility] Clinician Interpretation: I have personally reviewed the CNS images as listed.  My interpretation, in the context of the patient's clinical presentation, is stable disease  MR BRAIN W WO CONTRAST  Result Date: 05/03/2020 CLINICAL DATA:  Metastatic breast cancer.  Post SRS radiation. EXAM: MRI HEAD WITHOUT AND WITH CONTRAST TECHNIQUE: Multiplanar, multiecho pulse sequences of the brain and surrounding structures were obtained without and with intravenous contrast. CONTRAST:  6.69m GADAVIST GADOBUTROL 1 MMOL/ML IV SOLN COMPARISON:  MRI head 01/03/2020 FINDINGS: Brain: Edema in the high right frontal lobe appears slightly improved. Dural thickening and enhancement in the right frontal region is similar. There is nodular tumor enhancement along the dura in the right frontal lobe which is stable. Small amount of hemorrhage in 1 of the lesions in the right frontal lobe over the convexity stable. No new metastatic deposits in the brain.  Ventricle size normal. No midline shift. Negative for acute infarct. Vascular: Normal arterial flow voids Skull and upper cervical spine: Widespread bony metastatic disease to the calvarium primarily on the right side is stable. There is also diffuse bony disease in the cervical spine. Sinuses/Orbits: Mild mucosal edema paranasal sinuses. No orbital lesion. Other: None IMPRESSION: Metastatic disease to the right calvarium as well as the right frontal dura. Enhancing nodular tumor along the dura of the right frontal lobe is stable. There is slightly decreased edema in the right frontal lobe over the convexity. No new lesions. Electronically Signed   By: CFranchot GalloM.D.   On: 05/03/2020 08:08   ECHOCARDIOGRAM COMPLETE  Result Date: 04/07/2020    ECHOCARDIOGRAM REPORT   Patient Name:   CDENEISHA DADEPRIDDY Date of Exam: 04/07/2020 Medical Rec #:  [id number]     Height:       68.0 in Accession #:    [id number]    Weight:       142.7 lb Date of Birth:  [birth year]     BSA:  1.771 m Patient Age:    49 years       BP:           147/77 mmHg Patient Gender: F              HR:           82 bpm. Exam Location:  Outpatient Procedure: 2D Echo, Cardiac Doppler and Color Doppler Indications:    Chemo evaluation  History:        Patient has prior history of Echocardiogram examinations, most                 recent 09/25/2019. Risk Factors:Hypertension and Dyslipidemia.                 Breast cancer, chemo, Left mastectomy.  Sonographer:    Dustin Flock Referring Phys: [id number] [place]  1. Left ventricular ejection fraction, by estimation, is 55 to 60%. The left ventricle has grossly normal function. Lateral wall is not well visualized, cannot rule out wall motion abnormality. There is mild left ventricular hypertrophy. Left ventricular diastolic parameters are indeterminate. GLS average -10.9%, which is significantly reduced, but appears to be due to poor endocardial visualization, particularly of  lateral wall. Recommend limited echo with contrast to better evaluate wall motion  2. Right ventricular systolic function is normal. The right ventricular size is normal. There is normal pulmonary artery systolic pressure. The estimated right ventricular systolic pressure is 14.9 mmHg.  3. The mitral valve is normal in structure. Mild mitral valve regurgitation.  4. The aortic valve is tricuspid. Aortic valve regurgitation is mild. No aortic stenosis is present.  5. The inferior vena cava is normal in size with greater than 50% respiratory variability, suggesting right atrial pressure of 3 mmHg. FINDINGS  Left Ventricle: Left ventricular ejection fraction, by estimation, is 55 to 60%. The left ventricle has normal function. The left ventricle has no regional wall motion abnormalities. The left ventricular internal cavity size was normal in size. There is  mild left ventricular hypertrophy. Left ventricular diastolic parameters are indeterminate. Right Ventricle: The right ventricular size is normal. No increase in right ventricular wall thickness. Right ventricular systolic function is normal. There is normal pulmonary artery systolic pressure. The tricuspid regurgitant velocity is 2.30 m/s, and  with an assumed right atrial pressure of 3 mmHg, the estimated right ventricular systolic pressure is 70.2 mmHg. Left Atrium: Left atrial size was normal in size. Right Atrium: Right atrial size was normal in size. Pericardium: Trivial pericardial effusion is present. Mitral Valve: The mitral valve is normal in structure. Mild to moderate mitral annular calcification. Mild mitral valve regurgitation. Tricuspid Valve: The tricuspid valve is normal in structure. Tricuspid valve regurgitation is trivial. Aortic Valve: The aortic valve is tricuspid. Aortic valve regurgitation is mild. Aortic regurgitation PHT measures 544 msec. No aortic stenosis is present. Pulmonic Valve: The pulmonic valve was grossly normal. Pulmonic valve  regurgitation is trivial. Aorta: The aortic root and ascending aorta are structurally normal, with no evidence of dilitation. Venous: The inferior vena cava is normal in size with greater than 50% respiratory variability, suggesting right atrial pressure of 3 mmHg. IAS/Shunts: No atrial level shunt detected by color flow Doppler.  LEFT VENTRICLE PLAX 2D LVIDd:         4.70 cm  Diastology LVIDs:         2.90 cm  LV e' lateral:   7.18 cm/s LV PW:  1.30 cm  LV E/e' lateral: 9.8 LV IVS:        1.20 cm  LV e' medial:    4.90 cm/s LVOT diam:     2.30 cm  LV E/e' medial:  14.3 LV SV:         93 LV SV Index:   52 LVOT Area:     4.15 cm  RIGHT VENTRICLE RV Basal diam:  3.50 cm RV S prime:     14.60 cm/s TAPSE (M-mode): 2.6 cm LEFT ATRIUM             Index       RIGHT ATRIUM           Index LA diam:        3.40 cm 1.92 cm/m  RA Area:     14.60 cm LA Vol (A2C):   36.9 ml 20.84 ml/m RA Volume:   39.50 ml  22.31 ml/m LA Vol (A4C):   45.1 ml 25.47 ml/m LA Biplane Vol: 42.9 ml 24.23 ml/m  AORTIC VALVE LVOT Vmax:   109.00 cm/s LVOT Vmean:  70.200 cm/s LVOT VTI:    0.223 m AI PHT:      544 msec  AORTA Ao Root diam: 3.10 cm MITRAL VALVE               TRICUSPID VALVE MV Area (PHT): 4.31 cm    TR Peak grad:   21.2 mmHg MV Decel Time: 176 msec    TR Vmax:        230.00 cm/s MV E velocity: 70.30 cm/s MV A velocity: 74.10 cm/s  SHUNTS MV E/A ratio:  0.95        Systemic VTI:  0.22 m                            Systemic Diam: 2.30 cm Oswaldo Milian MD Electronically signed by Oswaldo Milian MD Signature Date/Time: 04/07/2020/10:27:31 AM    Final       Assessment/Plan 1. Brain metastasis [facility])  Ms. Casagrande is clinically and radiographically stable today.  Dural based lesions are still visible but regressed or stable from prior studies.  Recommended continuing Keppra 266m BID for seizure prevention.  We appreciate the opportunity to participate in the care of CCobie LeidnerPriddy.    We ask that CAJWA KIMBERLEYreturn to clinic in 4 months following next brain MRI, or sooner as needed.    All questions were answered. The patient knows to call the clinic with any problems, questions or concerns. No barriers to learning were detected.  I have spent a total of 30 minutes of face-to-face and non-face-to-face time, excluding clinical staff time, preparing to see patient, ordering tests and/or medications, counseling the patient, and independently interpreting results and communicating results to the patient/family/caregiver    ZVentura Sellers MD Medical Director of Neuro-Oncology [facility]07/19/21 8:44 AM

## 2020-05-05 ENCOUNTER — Telehealth: Payer: Self-pay | Admitting: Internal Medicine

## 2020-05-05 ENCOUNTER — Telehealth: Payer: Self-pay | Admitting: *Deleted

## 2020-05-05 MED ORDER — LEVETIRACETAM 500 MG PO TABS: 500.0000 mg | ORAL_TABLET | Freq: Two times a day (BID) | ORAL | 4 refills | Status: DC

## 2020-05-05 NOTE — Telephone Encounter (Signed)
Scheduled per 7/19 los. Pt Is aware of appt time and date.

## 2020-05-05 NOTE — Telephone Encounter (Signed)
Patient needs refill of Keppra 500 mg BID (instructions take half tablet twice daily) sent to [facility] in [place].  Routed to MD to refill.

## 2020-05-05 NOTE — Addendum Note (Signed)
Addended by: Ventura Sellers on: 05/05/2020 12:27 PM   Modules accepted: Orders

## 2020-05-11 ENCOUNTER — Telehealth: Payer: Self-pay | Admitting: *Deleted

## 2020-05-11 NOTE — Telephone Encounter (Signed)
Pt called asking about previous refill of Keppra 500 mg BID. Advised pt that instructions were to half 500 mg to take twice daily as discussed with Dr.Vaslow. Prescriptions were sent to preferred pharmacy on 7/20 in [place]. Advised to call pharmacy to check for prescriptions and to call if there were any further concerns

## 2020-05-15 ENCOUNTER — Inpatient Hospital Stay: Payer: Medicare Other

## 2020-05-15 ENCOUNTER — Other Ambulatory Visit: Payer: Self-pay

## 2020-05-15 VITALS — BP 150/65 | HR 61 | Temp 98.0°F | Resp 18

## 2020-05-15 DIAGNOSIS — C50412 Malignant neoplasm of upper-outer quadrant of left female breast: Secondary | ICD-10-CM

## 2020-05-15 DIAGNOSIS — Z170 Estrogen receptor positive status [ER+]: Secondary | ICD-10-CM

## 2020-05-15 DIAGNOSIS — C50919 Malignant neoplasm of unspecified site of unspecified female breast: Secondary | ICD-10-CM

## 2020-05-15 DIAGNOSIS — C7951 Secondary malignant neoplasm of bone: Secondary | ICD-10-CM

## 2020-05-15 DIAGNOSIS — Z5112 Encounter for antineoplastic immunotherapy: Secondary | ICD-10-CM | POA: Diagnosis not present

## 2020-05-15 DIAGNOSIS — Z95828 Presence of other vascular implants and grafts: Secondary | ICD-10-CM

## 2020-05-15 LAB — CBC WITH DIFFERENTIAL (CANCER CENTER ONLY)
Abs Immature Granulocytes: 0.01 10*3/uL (ref 0.00–0.07)
Basophils Absolute: 0 10*3/uL (ref 0.0–0.1)
Basophils Relative: 1 %
Eosinophils Absolute: 0 10*3/uL (ref 0.0–0.5)
Eosinophils Relative: 2 %
HCT: 29.4 % — ABNORMAL LOW (ref 36.0–46.0)
Hemoglobin: 10 g/dL — ABNORMAL LOW (ref 12.0–15.0)
Immature Granulocytes: 0 %
Lymphocytes Relative: 43 %
Lymphs Abs: 1 10*3/uL (ref 0.7–4.0)
MCH: 36.1 pg — ABNORMAL HIGH (ref 26.0–34.0)
MCHC: 34 g/dL (ref 30.0–36.0)
MCV: 106.1 fL — ABNORMAL HIGH (ref 80.0–100.0)
Monocytes Absolute: 0.2 10*3/uL (ref 0.1–1.0)
Monocytes Relative: 10 %
Neutro Abs: 1 10*3/uL — ABNORMAL LOW (ref 1.7–7.7)
Neutrophils Relative %: 44 %
Platelet Count: 88 10*3/uL — ABNORMAL LOW (ref 150–400)
RBC: 2.77 MIL/uL — ABNORMAL LOW (ref 3.87–5.11)
RDW: 13.2 % (ref 11.5–15.5)
WBC Count: 2.2 10*3/uL — ABNORMAL LOW (ref 4.0–10.5)
nRBC: 0 % (ref 0.0–0.2)

## 2020-05-15 LAB — CMP (CANCER CENTER ONLY)
ALT: 43 U/L (ref 0–44)
AST: 56 U/L — ABNORMAL HIGH (ref 15–41)
Albumin: 3.6 g/dL (ref 3.5–5.0)
Alkaline Phosphatase: 229 U/L — ABNORMAL HIGH (ref 38–126)
Anion gap: 9 (ref 5–15)
BUN: 16 mg/dL (ref 8–23)
CO2: 27 mmol/L (ref 22–32)
Calcium: 10.4 mg/dL — ABNORMAL HIGH (ref 8.9–10.3)
Chloride: 105 mmol/L (ref 98–111)
Creatinine: 0.9 mg/dL (ref 0.44–1.00)
GFR, Est AFR Am: >60 mL/min (ref >60)
GFR, Estimated: >60 mL/min (ref >60)
Glucose, Bld: 117 mg/dL — ABNORMAL HIGH (ref 70–99)
Potassium: 3.5 mmol/L (ref 3.5–5.1)
Sodium: 141 mmol/L (ref 135–145)
Total Bilirubin: 0.6 mg/dL (ref 0.3–1.2)
Total Protein: 6.7 g/dL (ref 6.5–8.1)

## 2020-05-15 MED ORDER — SODIUM CHLORIDE 0.9 % IV SOLN
Freq: Once | INTRAVENOUS | Status: AC
  Filled 2020-05-15: qty 250

## 2020-05-15 MED ORDER — ACETAMINOPHEN 325 MG PO TABS
650.0000 mg | ORAL_TABLET | Freq: Once | ORAL | Status: AC
  Administered 2020-05-15: 650 mg via ORAL

## 2020-05-15 MED ORDER — HEPARIN SOD (PORK) LOCK FLUSH 100 UNIT/ML IV SOLN
500.0000 [IU] | Freq: Once | INTRAVENOUS | Status: AC | PRN
  Administered 2020-05-15: 500 [IU]
  Filled 2020-05-15: qty 5

## 2020-05-15 MED ORDER — DIPHENHYDRAMINE HCL 25 MG PO CAPS
ORAL_CAPSULE | ORAL | Status: AC
  Filled 2020-05-15: qty 2

## 2020-05-15 MED ORDER — ACETAMINOPHEN 325 MG PO TABS
ORAL_TABLET | ORAL | Status: AC
  Filled 2020-05-15: qty 2

## 2020-05-15 MED ORDER — TRASTUZUMAB-DKST CHEMO 150 MG IV SOLR
6.0000 mg/kg | Freq: Once | INTRAVENOUS | Status: AC
  Administered 2020-05-15: 399 mg via INTRAVENOUS
  Filled 2020-05-15: qty 19

## 2020-05-15 MED ORDER — SODIUM CHLORIDE 0.9% FLUSH
10.0000 mL | Freq: Once | INTRAVENOUS | Status: AC
  Administered 2020-05-15: 10 mL
  Filled 2020-05-15: qty 10

## 2020-05-15 MED ORDER — DIPHENHYDRAMINE HCL 25 MG PO CAPS
50.0000 mg | ORAL_CAPSULE | Freq: Once | ORAL | Status: AC
  Administered 2020-05-15: 50 mg via ORAL

## 2020-05-15 MED ORDER — SODIUM CHLORIDE 0.9% FLUSH
10.0000 mL | INTRAVENOUS | Status: DC | PRN
  Administered 2020-05-15: 10 mL
  Filled 2020-05-15: qty 10

## 2020-05-15 NOTE — Patient Instructions (Signed)

## 2020-05-15 NOTE — Patient Instructions (Signed)
[facility] Discharge Instructions for Patients Receiving Chemotherapy  Today you received the following immunotherapy agent: Trastuzumab  To help prevent nausea and vomiting after your treatment, we encourage you to take your nausea medication as directed by your MD.   If you develop nausea and vomiting that is not controlled by your nausea medication, call the clinic.   BELOW ARE SYMPTOMS THAT SHOULD BE REPORTED IMMEDIATELY:  *FEVER GREATER THAN 100.5 F  *CHILLS WITH OR WITHOUT FEVER  NAUSEA AND VOMITING THAT IS NOT CONTROLLED WITH YOUR NAUSEA MEDICATION  *UNUSUAL SHORTNESS OF BREATH  *UNUSUAL BRUISING OR BLEEDING  TENDERNESS IN MOUTH AND THROAT WITH OR WITHOUT PRESENCE OF ULCERS  *URINARY PROBLEMS  *BOWEL PROBLEMS  UNUSUAL RASH Items with * indicate a potential emergency and should be followed up as soon as possible.  Feel free to call the clinic should you have any questions or concerns. The clinic phone number is [phone].  Please show the CHEMO ALERT CARD at check-in to the Emergency Department and triage nurse.   

## 2020-05-19 ENCOUNTER — Other Ambulatory Visit: Payer: Self-pay | Admitting: Family Medicine

## 2020-05-19 DIAGNOSIS — I10 Essential (primary) hypertension: Secondary | ICD-10-CM

## 2020-05-20 ENCOUNTER — Other Ambulatory Visit: Payer: Self-pay | Admitting: Hematology and Oncology

## 2020-05-20 DIAGNOSIS — C50412 Malignant neoplasm of upper-outer quadrant of left female breast: Secondary | ICD-10-CM

## 2020-05-22 MED FILL — VERZENIO 100 MG TAB: 100 | 28 days supply | Qty: 56 | Fill #3

## 2020-05-25 ENCOUNTER — Other Ambulatory Visit: Payer: Self-pay

## 2020-05-25 ENCOUNTER — Ambulatory Visit (HOSPITAL_COMMUNITY)
Admission: RE | Admit: 2020-05-25 | Discharge: 2020-05-25 | Disposition: A | Discharge status: Home / Self Care | Payer: Medicare Other | Source: Ambulatory Visit | Attending: Adult Health | Admitting: Adult Health

## 2020-05-25 ENCOUNTER — Encounter (HOSPITAL_COMMUNITY)
Admission: RE | Admit: 2020-05-25 | Discharge: 2020-05-25 | Disposition: A | Discharge status: Home / Self Care | Payer: Medicare Other | Source: Ambulatory Visit | Attending: Adult Health | Admitting: Adult Health

## 2020-05-25 ENCOUNTER — Encounter (HOSPITAL_COMMUNITY): Payer: Self-pay

## 2020-05-25 DIAGNOSIS — C50412 Malignant neoplasm of upper-outer quadrant of left female breast: Secondary | ICD-10-CM | POA: Insufficient documentation

## 2020-05-25 DIAGNOSIS — C50919 Malignant neoplasm of unspecified site of unspecified female breast: Secondary | ICD-10-CM

## 2020-05-25 DIAGNOSIS — Z170 Estrogen receptor positive status [ER+]: Secondary | ICD-10-CM

## 2020-05-25 MED ORDER — IOHEXOL 300 MG/ML  SOLN
100.0000 mL | Freq: Once | INTRAMUSCULAR | Status: AC | PRN
  Administered 2020-05-25: 100 mL via INTRAVENOUS

## 2020-05-25 MED ORDER — HEPARIN SOD (PORK) LOCK FLUSH 100 UNIT/ML IV SOLN
INTRAVENOUS | Status: AC
  Administered 2020-05-25: 500 [IU] via INTRAVENOUS
  Filled 2020-05-25: qty 5

## 2020-05-25 MED ORDER — HEPARIN SOD (PORK) LOCK FLUSH 100 UNIT/ML IV SOLN: 500.0000 [IU] | Freq: Once | INTRAVENOUS | Status: AC

## 2020-05-25 MED ORDER — TECHNETIUM TC 99M MEDRONATE IV KIT
21.7000 | PACK | Freq: Once | INTRAVENOUS | Status: AC | PRN
  Administered 2020-05-25: 21.7 via INTRAVENOUS

## 2020-05-25 MED ORDER — SODIUM CHLORIDE (PF) 0.9 % IJ SOLN
INTRAMUSCULAR | Status: AC
  Filled 2020-05-25: qty 50

## 2020-05-26 ENCOUNTER — Other Ambulatory Visit: Payer: Self-pay | Admitting: Oncology

## 2020-06-12 ENCOUNTER — Other Ambulatory Visit: Payer: Medicare Other

## 2020-06-12 ENCOUNTER — Inpatient Hospital Stay (HOSPITAL_BASED_OUTPATIENT_CLINIC_OR_DEPARTMENT_OTHER): Payer: Medicare Other | Admitting: Adult Health

## 2020-06-12 ENCOUNTER — Encounter: Payer: Self-pay | Admitting: Adult Health

## 2020-06-12 ENCOUNTER — Telehealth: Payer: Self-pay | Admitting: Adult Health

## 2020-06-12 ENCOUNTER — Inpatient Hospital Stay: Payer: Medicare Other

## 2020-06-12 ENCOUNTER — Inpatient Hospital Stay: Payer: Medicare Other | Attending: Hematology and Oncology

## 2020-06-12 ENCOUNTER — Other Ambulatory Visit: Payer: Self-pay

## 2020-06-12 VITALS — BP 152/78 | HR 72 | Temp 97.6°F | Resp 17 | Ht 68.0 in | Wt 141.3 lb

## 2020-06-12 DIAGNOSIS — E876 Hypokalemia: Secondary | ICD-10-CM | POA: Diagnosis not present

## 2020-06-12 DIAGNOSIS — C7931 Secondary malignant neoplasm of brain: Secondary | ICD-10-CM | POA: Diagnosis not present

## 2020-06-12 DIAGNOSIS — Z170 Estrogen receptor positive status [ER+]: Secondary | ICD-10-CM

## 2020-06-12 DIAGNOSIS — E039 Hypothyroidism, unspecified: Secondary | ICD-10-CM | POA: Insufficient documentation

## 2020-06-12 DIAGNOSIS — R7989 Other specified abnormal findings of blood chemistry: Secondary | ICD-10-CM | POA: Insufficient documentation

## 2020-06-12 DIAGNOSIS — I10 Essential (primary) hypertension: Secondary | ICD-10-CM | POA: Diagnosis not present

## 2020-06-12 DIAGNOSIS — Z5112 Encounter for antineoplastic immunotherapy: Secondary | ICD-10-CM | POA: Diagnosis present

## 2020-06-12 DIAGNOSIS — I2510 Atherosclerotic heart disease of native coronary artery without angina pectoris: Secondary | ICD-10-CM | POA: Insufficient documentation

## 2020-06-12 DIAGNOSIS — C50412 Malignant neoplasm of upper-outer quadrant of left female breast: Secondary | ICD-10-CM

## 2020-06-12 DIAGNOSIS — Z95828 Presence of other vascular implants and grafts: Secondary | ICD-10-CM

## 2020-06-12 DIAGNOSIS — C7951 Secondary malignant neoplasm of bone: Secondary | ICD-10-CM | POA: Insufficient documentation

## 2020-06-12 DIAGNOSIS — C50919 Malignant neoplasm of unspecified site of unspecified female breast: Secondary | ICD-10-CM

## 2020-06-12 DIAGNOSIS — Z79811 Long term (current) use of aromatase inhibitors: Secondary | ICD-10-CM | POA: Insufficient documentation

## 2020-06-12 DIAGNOSIS — Z9013 Acquired absence of bilateral breasts and nipples: Secondary | ICD-10-CM | POA: Diagnosis not present

## 2020-06-12 DIAGNOSIS — Z9221 Personal history of antineoplastic chemotherapy: Secondary | ICD-10-CM | POA: Diagnosis not present

## 2020-06-12 DIAGNOSIS — D696 Thrombocytopenia, unspecified: Secondary | ICD-10-CM | POA: Insufficient documentation

## 2020-06-12 DIAGNOSIS — Z923 Personal history of irradiation: Secondary | ICD-10-CM | POA: Insufficient documentation

## 2020-06-12 LAB — CBC WITH DIFFERENTIAL (CANCER CENTER ONLY)
Abs Immature Granulocytes: 0.01 10*3/uL (ref 0.00–0.07)
Basophils Absolute: 0.1 10*3/uL (ref 0.0–0.1)
Basophils Relative: 2 %
Eosinophils Absolute: 0 10*3/uL (ref 0.0–0.5)
Eosinophils Relative: 1 %
HCT: 29.9 % — ABNORMAL LOW (ref 36.0–46.0)
Hemoglobin: 10.1 g/dL — ABNORMAL LOW (ref 12.0–15.0)
Immature Granulocytes: 0 %
Lymphocytes Relative: 45 %
Lymphs Abs: 1.3 10*3/uL (ref 0.7–4.0)
MCH: 35.8 pg — ABNORMAL HIGH (ref 26.0–34.0)
MCHC: 33.8 g/dL (ref 30.0–36.0)
MCV: 106 fL — ABNORMAL HIGH (ref 80.0–100.0)
Monocytes Absolute: 0.2 10*3/uL (ref 0.1–1.0)
Monocytes Relative: 8 %
Neutro Abs: 1.3 10*3/uL — ABNORMAL LOW (ref 1.7–7.7)
Neutrophils Relative %: 44 %
Platelet Count: 94 10*3/uL — ABNORMAL LOW (ref 150–400)
RBC: 2.82 MIL/uL — ABNORMAL LOW (ref 3.87–5.11)
RDW: 13.5 % (ref 11.5–15.5)
WBC Count: 2.9 10*3/uL — ABNORMAL LOW (ref 4.0–10.5)
nRBC: 0 % (ref 0.0–0.2)

## 2020-06-12 LAB — CMP (CANCER CENTER ONLY)
ALT: 34 U/L (ref 0–44)
AST: 53 U/L — ABNORMAL HIGH (ref 15–41)
Albumin: 3.6 g/dL (ref 3.5–5.0)
Alkaline Phosphatase: 199 U/L — ABNORMAL HIGH (ref 38–126)
Anion gap: 7 (ref 5–15)
BUN: 16 mg/dL (ref 8–23)
CO2: 29 mmol/L (ref 22–32)
Calcium: 10.7 mg/dL — ABNORMAL HIGH (ref 8.9–10.3)
Chloride: 102 mmol/L (ref 98–111)
Creatinine: 0.95 mg/dL (ref 0.44–1.00)
GFR, Est AFR Am: >60 mL/min (ref >60)
GFR, Estimated: >60 mL/min (ref >60)
Glucose, Bld: 94 mg/dL (ref 70–99)
Potassium: 3.7 mmol/L (ref 3.5–5.1)
Sodium: 138 mmol/L (ref 135–145)
Total Bilirubin: 0.5 mg/dL (ref 0.3–1.2)
Total Protein: 6.8 g/dL (ref 6.5–8.1)

## 2020-06-12 MED ORDER — ACETAMINOPHEN 325 MG PO TABS
ORAL_TABLET | ORAL | Status: AC
  Filled 2020-06-12: qty 2

## 2020-06-12 MED ORDER — DENOSUMAB 120 MG/1.7ML ~~LOC~~ SOLN
SUBCUTANEOUS | Status: AC
  Filled 2020-06-12: qty 1.7

## 2020-06-12 MED ORDER — ACETAMINOPHEN 325 MG PO TABS
650.0000 mg | ORAL_TABLET | Freq: Once | ORAL | Status: AC
  Administered 2020-06-12: 650 mg via ORAL

## 2020-06-12 MED ORDER — HEPARIN SOD (PORK) LOCK FLUSH 100 UNIT/ML IV SOLN
500.0000 [IU] | Freq: Once | INTRAVENOUS | Status: AC | PRN
  Administered 2020-06-12: 500 [IU]
  Filled 2020-06-12: qty 5

## 2020-06-12 MED ORDER — SODIUM CHLORIDE 0.9% FLUSH
10.0000 mL | Freq: Once | INTRAVENOUS | Status: AC
  Administered 2020-06-12: 10 mL
  Filled 2020-06-12: qty 10

## 2020-06-12 MED ORDER — SODIUM CHLORIDE 0.9% FLUSH
10.0000 mL | INTRAVENOUS | Status: DC | PRN
  Administered 2020-06-12: 10 mL
  Filled 2020-06-12: qty 10

## 2020-06-12 MED ORDER — DENOSUMAB 120 MG/1.7ML ~~LOC~~ SOLN
120.0000 mg | Freq: Once | SUBCUTANEOUS | Status: AC
  Administered 2020-06-12: 120 mg via SUBCUTANEOUS

## 2020-06-12 MED ORDER — SODIUM CHLORIDE 0.9 % IV SOLN
Freq: Once | INTRAVENOUS | Status: AC
  Filled 2020-06-12: qty 250

## 2020-06-12 MED ORDER — DIPHENHYDRAMINE HCL 25 MG PO CAPS
ORAL_CAPSULE | ORAL | Status: AC
  Filled 2020-06-12: qty 1

## 2020-06-12 MED ORDER — TRASTUZUMAB-DKST CHEMO 150 MG IV SOLR
6.0000 mg/kg | Freq: Once | INTRAVENOUS | Status: AC
  Administered 2020-06-12: 399 mg via INTRAVENOUS
  Filled 2020-06-12: qty 19

## 2020-06-12 MED ORDER — DIPHENHYDRAMINE HCL 25 MG PO CAPS
50.0000 mg | ORAL_CAPSULE | Freq: Once | ORAL | Status: AC
  Administered 2020-06-12: 50 mg via ORAL

## 2020-06-12 MED ORDER — DIPHENHYDRAMINE HCL 25 MG PO CAPS
ORAL_CAPSULE | ORAL | Status: AC
  Filled 2020-06-12: qty 2

## 2020-06-12 NOTE — Patient Instructions (Signed)

## 2020-06-12 NOTE — Patient Instructions (Signed)
[facility] °Discharge Instructions for Patients Receiving Chemotherapy ° °Today you received the following chemotherapy agents Trastuzumab ° °To help prevent nausea and vomiting after your treatment, we encourage you to take your nausea medication as directed. °  °If you develop nausea and vomiting that is not controlled by your nausea medication, call the clinic.  ° °BELOW ARE SYMPTOMS THAT SHOULD BE REPORTED IMMEDIATELY: °· *FEVER GREATER THAN 100.5 F °· *CHILLS WITH OR WITHOUT FEVER °· NAUSEA AND VOMITING THAT IS NOT CONTROLLED WITH YOUR NAUSEA MEDICATION °· *UNUSUAL SHORTNESS OF BREATH °· *UNUSUAL BRUISING OR BLEEDING °· TENDERNESS IN MOUTH AND THROAT WITH OR WITHOUT PRESENCE OF ULCERS °· *URINARY PROBLEMS °· *BOWEL PROBLEMS °· UNUSUAL RASH °Items with * indicate a potential emergency and should be followed up as soon as possible. ° °Feel free to call the clinic should you have any questions or concerns. The clinic phone number is [phone]. ° °Please show the CHEMO ALERT CARD at check-in to the Emergency Department and triage nurse. ° ° °

## 2020-06-12 NOTE — Telephone Encounter (Signed)
Scheduled per los. Gave avs and calendar  

## 2020-06-12 NOTE — Progress Notes (Signed)
[facility] Follow up:    [name] Fraise, MD [name]   DIAGNOSIS: Cancer Staging Breast cancer of upper-outer quadrant of left female breast [facility]) Staging form: Breast, AJCC 7th Edition - Clinical: Stage IIB (T2, N1, cM0) - Unsigned - Pathologic: No stage assigned - Unsigned - Pathologic stage from 06/22/2017: Stage IV ([place], [place], M1) - Signed by [name] Lose, MD on 12/27/2019   SUMMARY OF ONCOLOGIC HISTORY: Oncology History  Breast cancer of upper-outer quadrant of left female breast ([place])  11/14/2011 Surgery   Bilateral mastectomy, prophylactic on the right, left breast IDC 3/18 lymph nodes positive with extracapsular extension ER 89%, PR 81%, HER-2 negative, Ki-67 79% T2 N1 A. stage IIB   12/13/2011 - 06/28/2012 Chemotherapy   4 cycles of FEC followed by 4 cycles of Taxotere   07/17/2012 - 08/22/2012 Radiation Therapy   Adjuvant radiation therapy   08/22/2012 - 03/16/2016 Anti-estrogen oral therapy   Arimidex 1 mg daily   03/16/2016 Relapse/Recurrence   Subcutaneous nodule excision left chest: Infiltrating carcinoma breast primary, ER positive, PR negative   03/29/2016 Imaging   CT CAP and bone scan: Lytic lesions T8 vertebral, T1 posterior element, subcutaneous nodule left lateral chest wall, nonspecific lung nodules; Bone scan: Mets to kull, left humerus, left eighth rib, T7/T8, sternum, left acetabulum   04/28/2016 - 06/17/2017 Chemotherapy   Herceptin, lapatinib, Faslodex, Zometa every 4 weeks, lapatinib discontinued in September 2018 due to elevation of LFTs   06/22/2017 Relapse/Recurrence   Surgical excision:Soft tissue mass left lateral chest wall primary breast cancer, soft tissue mass left medial chest wall breast cancer, tumor is within the dermis extending to the subcutaneous adipose tissue and involves portions of skeletal muscle   06/22/2017 Cancer Staging   Staging form: Breast, AJCC 7th Edition - Pathologic stage from 06/22/2017:  Stage IV ([place], NX, M1) - Signed by [name] Lose, MD on 12/27/2019   08/2017 - 02/16/2018 Chemotherapy   Faslodex with Herceptin and Perjeta along with Zometa every 4 weeks    03/02/2018 - 05/25/2018 Chemotherapy   Kadcyla    05/11/2018 Imaging   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 Associated vasogenic edema within the underlying right cerebral hemisphere without significant midline shift. Signal abnormality throughout the visualized bone marrow, compatible with osseous metastatic disease.      06/04/2018 Imaging   CT CAP: Right lower lobe lung nodule 7 mm (was 5 mm); multiple bone metastases throughout the spine and ribs sternum scapula and humerus, slightly increased lower thoracic mets, right renal lesion 2.5 cm (was 1.2 cm) right femur met increased from 2.1 cm to 2.9 cm   06/15/2018 -  Anti-estrogen oral therapy   Abemaciclib, Herceptin, letrozole, Xgeva   06/07/2019 Imaging   Progression of brain metastases,2 discrete dural-based lesions involving the posterior right frontal lobe. The more anterior lesion now measures 16.5 x 17.5 x 14 mm. The posterior lesion now measures 9.5 x 13 x 20 mm.  Vasogenic edema of the frontal and parietal lobes   06/21/2019 - 06/27/2019 Radiation Therapy   SRS to the brain   Metastatic breast cancer ([place])  06/29/2017 Initial Diagnosis   Metastatic breast cancer ([place])   06/15/2018 -  Chemotherapy   The patient had trastuzumab (HERCEPTIN) 546 mg in sodium chloride 0.9 % 250 mL chemo infusion, 8 mg/kg = 546 mg, Intravenous,  Once, 13 of 13 cycles Administration: 546 mg (06/15/2018), 399 mg (09/07/2018), 399 mg (07/13/2018), 399 mg (10/12/2018), 399  mg (11/02/2018), 399 mg (11/30/2018), 399 mg (01/24/2019), 399 mg (12/28/2018), 399 mg (02/22/2019), 399 mg (03/22/2019), 399 mg (04/17/2019), 399 mg (08/10/2018), 399 mg (05/17/2019) trastuzumab-dkst (OGIVRI) 399 mg in sodium chloride 0.9 % 250 mL chemo infusion, 6 mg/kg = 399 mg (100 % of original dose  6 mg/kg), Intravenous,  Once, 14 of 17 cycles Dose modification: 6 mg/kg (original dose 6 mg/kg, Cycle 14, Reason: Other (see comments), Comment: Biosimilar Conversion) Administration: 399 mg (06/14/2019), 399 mg (07/12/2019), 399 mg (08/09/2019), 399 mg (09/06/2019), 399 mg (10/04/2019), 399 mg (11/01/2019), 399 mg (11/29/2019), 399 mg (12/27/2019), 399 mg (01/24/2020), 399 mg (02/21/2020), 399 mg (03/20/2020), 399 mg (04/17/2020), 399 mg (05/15/2020), 399 mg (06/12/2020)  for chemotherapy treatment.      CURRENT THERAPY: Abemaciclib, Letrozole, herceptin, Delton See  INTERVAL HISTORY: [name] Riley 69 y.o. female returns for evaluation of her metastatic breast cancer.  Her most recent bone scan was completed on 05/25/2020 and showed no change compared to her prior bone scan.  She also underwent ct chest abdomen and pelvis.  This showed no evidence of metastatic disease.  She was unable to review these results in Mychart  Because she could not recall her password.    She is feeling well today and her labs are stable. She is remaining active.     Patient Active Problem List   Diagnosis Date Noted  . Brain metastasis ([place]) 07/13/2018  . Goals of care, counseling/discussion 06/05/2018  . UTI (urinary tract infection) 05/31/2018  . Sepsis secondary to UTI ([place]) 05/31/2018  . Thrombocytopenia ([place]) 05/31/2018  . Hypokalemia 05/31/2018  . Port-A-Cath in place 03/23/2018  . Elevated LFTs   . Metastatic breast cancer ([place]) 06/29/2017  . Nodule of skin of breast 03/06/2017  . Abscess, abdomen 07/06/2016  . Metastasis to bone ([place]) 06/01/2016  . Pneumothorax on left 04/25/2016  . Chest pain 10/23/2014  . Radiation-induced dermatitis 07/27/2012  . Hypertension   . Breast cancer of upper-outer quadrant of left female breast ([place]) 09/01/2011  . Hypothyroidism 06/12/2009  . Mixed hyperlipidemia 06/12/2009  . Essential hypertension 06/12/2009  . Coronary atherosclerosis 06/12/2009  . DIZZINESS 06/12/2009  .  PALPITATIONS 06/12/2009    is allergic to cantaloupe extract allergy skin test, contrast media [iodinated diagnostic agents], pravastatin, and zosyn [piperacillin sod-tazobactam so].  MEDICAL HISTORY: Past Medical History:  Diagnosis Date  . Allergy   . Breast cancer ([place]) 08/25/2011   L , invasive ductal carcinoma, ER/PR +,HER2 -  . Cancer [facility])    left breast cancer  . Coronary artery disease 2001  . Heart attack [facility]) 09/2000   Sep 25, 2000  --no intervention  . History of chemotherapy comp. 08/22/2012   4 cycles of FEC and $ cycles of Taxotere  . Hyperlipidemia   . Hypertension   . Hypothyroidism   . PONV (postoperative nausea and vomiting)    gets sick from anesthesia  . Status post radiation therapy 07/09/12 - 08/22/2012   Left Breast, 60.4 gray    SURGICAL HISTORY: Past Surgical History:  Procedure Laterality Date  . ABDOMINAL HYSTERECTOMY  1998   TAH, oophorectomy  . APPENDECTOMY  1970  . BREAST SURGERY  1998   removal of benign lump in rt breast  . BREAST SURGERY  11/14/11   right simple mastectomy, left mrm  . INCISION AND DRAINAGE OF WOUND Left 07/01/2017   Procedure: IRRIGATION AND DEBRIDEMENT CHEST WALL ABSCESS;  Surgeon: Donnie Mesa, MD;  Location: [facility];  Service: General;  Laterality: Left;  .  MASS EXCISION Left 06/22/2017   Procedure: EXCISION OF CHEST WALL MASSES;  Surgeon: Donnie Mesa, MD;  Location: [place];  Service: General;  Laterality: Left;  Marland Kitchen MASTECTOMY Bilateral    for left breast cancer  . OVARIAN CYST SURGERY Right 1970  . PORT-A-CATH REMOVAL  08/30/2012   Procedure: REMOVAL PORT-A-CATH;  Surgeon: Imogene Burn. [name], MD;  Location: [place];  Service: General;  Laterality: Right;  port removal  . PORTACATH PLACEMENT  11/14/2011   Procedure: INSERTION PORT-A-CATH;  Surgeon: Imogene Burn. [name], MD;  Location: [place];  Service: General;  Laterality: Right;  . PORTACATH PLACEMENT N/A 04/25/2016   Procedure: INSERTION PORT-A-CATH LEFT  CHEST;  Surgeon: Donnie Mesa, MD;  Location: [place];  Service: General;  Laterality: N/A;  . skin tags  05/09/1997   left axillary left neck skin tags  . TONSILLECTOMY  1968    SOCIAL HISTORY: Social History   Socioeconomic History  . Marital status: Married    Spouse name: Not on file  . Number of children: 3  . Years of education: Not on file  . Highest education level: High school graduate  Occupational History  . Occupation: Works at [employer]  Tobacco Use  . Smoking status: Never Smoker  . Smokeless tobacco: Never Used  Vaping Use  . Vaping Use: Never used  Substance and Sexual Activity  . Alcohol use: No  . Drug use: No  . Sexual activity: Yes    Birth control/protection: Surgical    Comment: menarche 72, Parity age 64, G60, P3, 1 miscarriage,  HRT x 5-10 yrs, Mild Hot Flashes  Other Topics Concern  . Not on file  Social History Narrative   Lives at home.   Social Determinants of Health   Financial Resource Strain:   . Difficulty of Paying Living Expenses: Not on file  Food Insecurity:   . Worried About Charity fundraiser in the Last Year: Not on file  . Ran Out of Food in the Last Year: Not on file  Transportation Needs: No Transportation Needs  . Lack of Transportation (Medical): No  . Lack of Transportation (Non-Medical): No  Physical Activity:   . Days of Exercise per Week: Not on file  . Minutes of Exercise per Session: Not on file  Stress:   . Feeling of Stress : Not on file  Social Connections:   . Frequency of Communication with Friends and Family: Not on file  . Frequency of Social Gatherings with Friends and Family: Not on file  . Attends Religious Services: Not on file  . Active Member of Clubs or Organizations: Not on file  . Attends Archivist Meetings: Not on file  . Marital Status: Not on file  Intimate Partner Violence:   . Fear of Current or Ex-Partner: Not on file  . Emotionally Abused: Not on file   . Physically Abused: Not on file  . Sexually Abused: Not on file    FAMILY HISTORY: Family History  Problem Relation Age of Onset  . Hypertension Maternal Grandmother   . Diabetes Maternal Grandmother   . Cancer Father 3       lung cancer and Prostate Cancer  . Hypertension Mother   . Cancer Paternal Aunt        ovarian  . Cancer Cousin        breast, paternal cousin  . Cancer Paternal Uncle        stomach  .  Cancer Paternal Grandfather        Esophagus  . Colon cancer Neg Hx     Review of Systems  Constitutional: Positive for fatigue. Negative for appetite change, chills, fever and unexpected weight change.  HENT:   Negative for hearing loss, lump/mass and mouth sores.   Eyes: Negative for eye problems and icterus.  Respiratory: Negative for chest tightness, cough and shortness of breath.   Cardiovascular: Negative for chest pain, leg swelling and palpitations.  Gastrointestinal: Negative for abdominal distention, abdominal pain, constipation, diarrhea, nausea and vomiting.  Endocrine: Negative for hot flashes.  Musculoskeletal: Negative for arthralgias.  Skin: Negative for itching and rash.  Neurological: Negative for dizziness, extremity weakness, headaches and numbness.  Hematological: Negative for adenopathy. Does not bruise/bleed easily.  Psychiatric/Behavioral: Negative for depression. The patient is not nervous/anxious.       PHYSICAL EXAMINATION  ECOG PERFORMANCE STATUS: 1 - Symptomatic but completely ambulatory  Vitals:   06/12/20 1034  BP: (!) 152/78  Pulse: 72  Resp: 17  Temp: 97.6 F (36.4 C)  SpO2: 100%    Physical Exam Constitutional:      Appearance: Normal appearance.  HENT:     Head: Normocephalic and atraumatic.  Eyes:     General: No scleral icterus. Cardiovascular:     Rate and Rhythm: Normal rate and regular rhythm.     Pulses: Normal pulses.     Heart sounds: Normal heart sounds.  Pulmonary:     Effort: Pulmonary effort is  normal.     Breath sounds: Normal breath sounds.  Abdominal:     General: Abdomen is flat. Bowel sounds are normal.     Palpations: Abdomen is soft.  Musculoskeletal:        General: No swelling.     Cervical back: Neck supple.  Skin:    General: Skin is dry.     Findings: No rash.  Neurological:     Mental Status: She is alert.  Psychiatric:        Mood and Affect: Mood normal.        Behavior: Behavior normal.     LABORATORY DATA:  CBC    Component Value Date/Time   WBC 2.9 (L) 06/12/2020 0907   WBC 4.3 07/01/2018 1036   RBC 2.82 (L) 06/12/2020 0907   HGB 10.1 (L) 06/12/2020 0907   HGB 10.6 (L) 12/12/2019 0800   HGB 11.5 (L) 09/22/2017 0958   HCT 29.9 (L) 06/12/2020 0907   HCT 31.4 (L) 12/12/2019 0800   HCT 34.9 09/22/2017 0958   PLT 94 (L) 06/12/2020 0907   PLT 82 (LL) 12/12/2019 0800   MCV 106.0 (H) 06/12/2020 0907   MCV 105 (H) 12/12/2019 0800   MCV 94.3 09/22/2017 0958   MCH 35.8 (H) 06/12/2020 0907   MCHC 33.8 06/12/2020 0907   RDW 13.5 06/12/2020 0907   RDW 14.4 12/12/2019 0800   RDW 15.3 (H) 09/22/2017 0958   LYMPHSABS 1.3 06/12/2020 0907   LYMPHSABS 1.5 12/12/2019 0800   LYMPHSABS 2.2 09/22/2017 0958   MONOABS 0.2 06/12/2020 0907   MONOABS 0.8 09/22/2017 0958   EOSABS 0.0 06/12/2020 0907   EOSABS 0.1 12/12/2019 0800   BASOSABS 0.1 06/12/2020 0907   BASOSABS 0.1 12/12/2019 0800   BASOSABS 0.0 09/22/2017 0958    CMP     Component Value Date/Time   NA 138 06/12/2020 0955   NA 140 01/23/2020 0857   NA 140 09/22/2017 0958   K 3.7 06/12/2020 0955  K 3.7 09/22/2017 0958   CL 102 06/12/2020 0955   CL 105 08/10/2012 0908   CO2 29 06/12/2020 0955   CO2 26 09/22/2017 0958   GLUCOSE 94 06/12/2020 0955   GLUCOSE 80 09/22/2017 0958   GLUCOSE 81 08/10/2012 0908   BUN 16 06/12/2020 0955   BUN 15 01/23/2020 0857   BUN 12.8 09/22/2017 0958   CREATININE 0.95 06/12/2020 0955   CREATININE 0.6 09/22/2017 0958   CALCIUM 10.7 (H) 06/12/2020 0955    CALCIUM 9.1 09/22/2017 0958   PROT 6.8 06/12/2020 0955   PROT 6.5 12/12/2019 0800   PROT 7.0 09/22/2017 0958   ALBUMIN 3.6 06/12/2020 0955   ALBUMIN 4.2 12/12/2019 0800   ALBUMIN 4.0 09/22/2017 0958   AST 53 (H) 06/12/2020 0955   AST 37 (H) 09/22/2017 0958   ALT 34 06/12/2020 0955   ALT 31 09/22/2017 0958   ALKPHOS 199 (H) 06/12/2020 0955   ALKPHOS 140 09/22/2017 0958   BILITOT 0.5 06/12/2020 0955   BILITOT 0.41 09/22/2017 0958   GFRNONAA >60 06/12/2020 0955   GFRAA >60 06/12/2020 0955          ASSESSMENT and THERAPY PLAN:   Breast cancer of upper-outer quadrant of left female breast ([place]) Current treatment: Abemaciclibwith letrozole along with Herceptin(to be given every 4 weeks)and Xgeva started 06/15/2018  05/25/2020: CT chest/abdomen/pelvis shows no evidence of disease, bone scan shows no change.    [name] Riley is tolerating her treatment well.  She has no clinical or radiographic signs of metastatic breast cancer progression.  We reviewed her scans in detail today.  Her labs remain stable.  She is mildly neutropenic, anemic, and thrombocytopenic, however these levels are stable and sufficient for her to continue on her therapy as she has been.  We discussed continued healthy diet and exercise.    Cherylynn will return in 4 weeks for labs and f/u with Dr. Jana Hakim.  She knows to call for any questions that may arise between now and her next appointment.  We are happy to see her sooner if needed.    Total encounter time: 20 minutes*  Wilber Bihari, NP 06/14/20 5:32 PM Medical Oncology and Hematology [facility] [place], [place] [address] Tel. [phone]    Fax. [phone]  *Total Encounter Time as defined by the Centers for Medicare and Medicaid Services includes, in addition to the face-to-face time of a patient visit (documented in the note above) non-face-to-face time: obtaining and reviewing outside history, ordering and reviewing  medications, tests or procedures, care coordination (communications with other health care professionals or caregivers) and documentation in the medical record.

## 2020-06-12 NOTE — Assessment & Plan Note (Addendum)
Current treatment: Abemaciclibwith letrozole along with Herceptin(to be given every 4 weeks)and Xgeva started 06/15/2018  05/25/2020: CT chest/abdomen/pelvis shows no evidence of disease, bone scan shows no change.    [name] Riley is tolerating her treatment well.  She has no clinical or radiographic signs of metastatic breast cancer progression.  We reviewed her scans in detail today.  Her labs remain stable.  She is mildly neutropenic, anemic, and thrombocytopenic, however these levels are stable and sufficient for her to continue on her therapy as she has been.  We discussed continued healthy diet and exercise.    [name] Riley will return in 4 weeks for labs and f/u with Dr. Jana Hakim.  She knows to call for any questions that may arise between now and her next appointment.  We are happy to see her sooner if needed.

## 2020-06-18 ENCOUNTER — Other Ambulatory Visit: Payer: Self-pay | Admitting: Hematology and Oncology

## 2020-06-18 DIAGNOSIS — Z170 Estrogen receptor positive status [ER+]: Secondary | ICD-10-CM

## 2020-06-25 ENCOUNTER — Other Ambulatory Visit: Payer: Self-pay | Admitting: Pharmacist

## 2020-06-25 ENCOUNTER — Other Ambulatory Visit: Payer: Self-pay | Admitting: *Deleted

## 2020-06-25 DIAGNOSIS — Z170 Estrogen receptor positive status [ER+]: Secondary | ICD-10-CM

## 2020-06-25 MED ORDER — ABEMACICLIB 100 MG PO TABS: 100.0000 mg | ORAL_TABLET | Freq: Two times a day (BID) | ORAL | 3 refills | Status: DC

## 2020-06-25 MED FILL — VERZENIO 100 MG TAB: 100 | 28 days supply | Qty: 56 | Fill #0

## 2020-07-09 NOTE — Progress Notes (Signed)
Patient Care Team: [name], MD as PCP - General (Family Medicine) Nicholas Lose, MD as Consulting Physician (Hematology and Oncology)  DIAGNOSIS:    ICD-10-CM   1. Metastatic breast cancer ([place])  C50.919     SUMMARY OF ONCOLOGIC HISTORY: Oncology History  Breast cancer of upper-outer quadrant of left female breast ([place])  11/14/2011 Surgery   Bilateral mastectomy, prophylactic on the right, left breast IDC 3/18 lymph nodes positive with extracapsular extension ER 89%, PR 81%, HER-2 negative, Ki-67 79% T2 N1 A. stage IIB   12/13/2011 - 06/28/2012 Chemotherapy   4 cycles of FEC followed by 4 cycles of Taxotere   07/17/2012 - 08/22/2012 Radiation Therapy   Adjuvant radiation therapy   08/22/2012 - 03/16/2016 Anti-estrogen oral therapy   Arimidex 1 mg daily   03/16/2016 Relapse/Recurrence   Subcutaneous nodule excision left chest: Infiltrating carcinoma breast primary, ER positive, PR negative   03/29/2016 Imaging   CT CAP and bone scan: Lytic lesions T8 vertebral, T1 posterior element, subcutaneous nodule left lateral chest wall, nonspecific lung nodules; Bone scan: Mets to kull, left humerus, left eighth rib, T7/T8, sternum, left acetabulum   04/28/2016 - 06/17/2017 Chemotherapy   Herceptin, lapatinib, Faslodex, Zometa every 4 weeks, lapatinib discontinued in September 2018 due to elevation of LFTs   06/22/2017 Relapse/Recurrence   Surgical excision:Soft tissue mass left lateral chest wall primary breast cancer, soft tissue mass left medial chest wall breast cancer, tumor is within the dermis extending to the subcutaneous adipose tissue and involves portions of skeletal muscle   06/22/2017 Cancer Staging   Staging form: Breast, AJCC 7th Edition - Pathologic stage from 06/22/2017: Stage IV (TX, NX, M1) - Signed by Nicholas Lose, MD on 12/27/2019   08/2017 - 02/16/2018 Chemotherapy   Faslodex with Herceptin and Perjeta along with Zometa every 4 weeks   03/02/2018 - 05/25/2018 Chemotherapy    [place]la   05/11/2018 Imaging   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 Associated vasogenic edema within the underlying right cerebral hemisphere without significant midline shift. Signal abnormality throughout the visualized bone marrow, compatible with osseous metastatic disease.    06/04/2018 Imaging   CT CAP: Right lower lobe lung nodule 7 mm (was 5 mm); multiple bone metastases throughout the spine and ribs sternum scapula and humerus, slightly increased lower thoracic mets, right renal lesion 2.5 cm (was 1.2 cm) right femur met increased from 2.1 cm to 2.9 cm   06/15/2018 -  Anti-estrogen oral therapy   Abemaciclib, Herceptin, letrozole, Xgeva   06/07/2019 Imaging   Progression of brain metastases,2 discrete dural-based lesions involving the posterior right frontal lobe. The more anterior lesion now measures 16.5 x 17.5 x 14 mm. The posterior lesion now measures 9.5 x 13 x 20 mm.  Vasogenic edema of the frontal and parietal lobes   06/21/2019 - 06/27/2019 Radiation Therapy   SRS to the brain   Metastatic breast cancer ([place])  06/29/2017 Initial Diagnosis   Metastatic breast cancer ([place])   06/15/2018 -  Chemotherapy   The patient had trastuzumab (HERCEPTIN) 546 mg in sodium chloride 0.9 % 250 mL chemo infusion, 8 mg/kg = 546 mg, Intravenous,  Once, 13 of 13 cycles Administration: 546 mg (06/15/2018), 399 mg (09/07/2018), 399 mg (07/13/2018), 399 mg (10/12/2018), 399 mg (11/02/2018), 399 mg (11/30/2018), 399 mg (01/24/2019), 399 mg (12/28/2018), 399 mg (02/22/2019), 399 mg (03/22/2019), 399 mg (04/17/2019), 399 mg (08/10/2018), 399 mg (05/17/2019) trastuzumab-dkst (OGIVRI) 399 mg in sodium chloride 0.9 % 250 mL chemo infusion, 6  mg/kg = 399 mg (100 % of original dose 6 mg/kg), Intravenous,  Once, 14 of 17 cycles Dose modification: 6 mg/kg (original dose 6 mg/kg, Cycle 14, Reason: Other (see comments), Comment: Biosimilar Conversion) Administration: 399 mg (06/14/2019), 399 mg  (07/12/2019), 399 mg (08/09/2019), 399 mg (09/06/2019), 399 mg (10/04/2019), 399 mg (11/01/2019), 399 mg (11/29/2019), 399 mg (12/27/2019), 399 mg (01/24/2020), 399 mg (02/21/2020), 399 mg (03/20/2020), 399 mg (04/17/2020), 399 mg (05/15/2020), 399 mg (06/12/2020)  for chemotherapy treatment.      CHIEF COMPLIANT: Follow-up on Herceptin with abemaciclib,letrozole,andXgeva  INTERVAL HISTORY: [name] Riley is a 69 y.o. with above-mentioned history of metastatic breast cancer currently on abemaciclib with letrozole along with HerceptinandXgeva.She presents to theclinictoday for treatment.    She has not had any problems with diarrhea since we reduce the dosage of her [place].  She has noted a scab underneath the left breast where she had the previous surgical incision and is planning to see Dr. [name].  Denies any nausea vomiting.  ALLERGIES:  is allergic to cantaloupe extract allergy skin test, contrast media [iodinated diagnostic agents], pravastatin, and zosyn [piperacillin sod-tazobactam so].  MEDICATIONS:  Current Outpatient Medications  Medication Sig Dispense Refill  . abemaciclib (VERZENIO) 100 MG tablet Take 1 tablet (100 mg total) by mouth 2 (two) times daily. Swallow tablets whole. Do not chew, crush, or split tablets before swallowing. 56 tablet 3  . acetaminophen (TYLENOL) 500 MG tablet Take 1,000 mg by mouth every 6 (six) hours as needed for moderate pain.    . cholecalciferol (VITAMIN D) 1000 units tablet Take 1 tablet (1,000 Units total) by mouth daily.    Marland Kitchen letrozole (FEMARA) 2.5 MG tablet TAKE ONE (1) TABLET EACH DAY 90 tablet 3  . levETIRAcetam (KEPPRA) 250 MG tablet Take 1 tablet (250 mg total) by mouth 2 (two) times daily.    Marland Kitchen levothyroxine (SYNTHROID) 100 MCG tablet TAKE ONE TABLET EACH MORNING BEFORE BREAKFAST 90 tablet 3  . lidocaine-prilocaine (EMLA) cream APPLY TOPICALLY AS NEEDED FOR PORT ACCESS 30 g 3  . metoprolol succinate (TOPROL-XL) 100 MG 24 hr tablet Take 1 tablet (100 mg  total) by mouth daily. 90 tablet 3  . Multiple Vitamins-Minerals (MULTIVITAMIN WITH MINERALS) tablet Take 1 tablet by mouth daily.      . ondansetron (ZOFRAN-ODT) 4 MG disintegrating tablet TAKE 1 TABLET EVERY 6 HOURS AS NEEDED FOR NAUSEA AND VOMITING 30 tablet 3  . rosuvastatin (CRESTOR) 5 MG tablet Take 1 tablet by mouth 2 (two) times daily.     Marland Kitchen triamcinolone (KENALOG) 0.025 % ointment Apply 1 application topically 2 (two) times daily. 30 g 0  . valsartan (DIOVAN) 80 MG tablet TAKE ONE (1) TABLET EACH DAY 90 tablet 0   No current facility-administered medications for this visit.    PHYSICAL EXAMINATION: ECOG PERFORMANCE STATUS: 1 - Symptomatic but completely ambulatory  Vitals:   07/10/20 0840  BP: (!) 143/87  Pulse: 69  Resp: 18  Temp: (!) 97.1 F (36.2 C)  SpO2: 100%   Filed Weights   07/10/20 0840  Weight: 141 lb 9.6 oz (64.2 kg)    LABORATORY DATA:  I have reviewed the data as listed CMP Latest Ref Rng & Units 06/12/2020 05/15/2020 04/17/2020  Glucose 70 - 99 mg/dL 94 117(H) 125(H)  BUN 8 - 23 mg/dL 16 16 16   Creatinine 0.44 - 1.00 mg/dL 0.95 0.90 0.95  Sodium 135 - 145 mmol/L 138 141 141  Potassium 3.5 - 5.1 mmol/L 3.7 3.5 3.8  Chloride 98 - 111 mmol/L 102 105 105  CO2 22 - 32 mmol/L 29 27 27   Calcium 8.9 - 10.3 mg/dL 10.7(H) 10.4(H) 9.6  Total Protein 6.5 - 8.1 g/dL 6.8 6.7 7.0  Total Bilirubin 0.3 - 1.2 mg/dL 0.5 0.6 0.6  Alkaline Phos 38 - 126 U/L 199(H) 229(H) 279(H)  AST 15 - 41 U/L 53(H) 56(H) 79(H)  ALT 0 - 44 U/L 34 43 58(H)    Lab Results  Component Value Date   WBC 2.6 (L) 07/10/2020   HGB 10.1 (L) 07/10/2020   HCT 30.1 (L) 07/10/2020   MCV 106.4 (H) 07/10/2020   PLT 88 (L) 07/10/2020   NEUTROABS 1.1 (L) 07/10/2020    ASSESSMENT & PLAN:  Metastatic breast cancer ([place]) Current treatment: Abemaciclibwith letrozole along with Herceptin(to be given every 4 weeks)and Xgeva started 06/15/2018 05/25/2020: CT chest/abdomen/pelvis shows no evidence of  disease, bone scan shows no change.    Toxicities:Cytopenias Thrombocytopenia: Monitoring closely, platelet count 88 Anemia: Today's hemoglobin is10.1 Leukopenia: Today's ANC 1.1 Diarrhea is under good control with a lower dosage of abemaciclib.  CT CAP 05/25/2020: No significant interval change in the bone metastases.  I encouraged her to continue abemaciclib.  Return to clinic monthly for Herceptin and every other month with labs for follow-up to see me    No orders of the defined types were placed in this encounter.  The patient has a good understanding of the overall plan. she agrees with it. she will call with any problems that may develop before the next visit here.  Total time spent: 30 mins including face to face time and time spent for planning, charting and coordination of care  Nicholas Lose, MD 07/10/2020  I, Cloyde Reams Dorshimer, am acting as scribe for Dr. Nicholas Lose.  I have reviewed the above documentation for accuracy and completeness, and I agree with the above.

## 2020-07-10 ENCOUNTER — Inpatient Hospital Stay: Payer: Medicare Other | Attending: Hematology and Oncology | Admitting: Hematology and Oncology

## 2020-07-10 ENCOUNTER — Inpatient Hospital Stay: Payer: Medicare Other

## 2020-07-10 ENCOUNTER — Other Ambulatory Visit: Payer: Self-pay

## 2020-07-10 DIAGNOSIS — D649 Anemia, unspecified: Secondary | ICD-10-CM | POA: Insufficient documentation

## 2020-07-10 DIAGNOSIS — Z170 Estrogen receptor positive status [ER+]: Secondary | ICD-10-CM

## 2020-07-10 DIAGNOSIS — C7951 Secondary malignant neoplasm of bone: Secondary | ICD-10-CM

## 2020-07-10 DIAGNOSIS — C50919 Malignant neoplasm of unspecified site of unspecified female breast: Secondary | ICD-10-CM

## 2020-07-10 DIAGNOSIS — Z95828 Presence of other vascular implants and grafts: Secondary | ICD-10-CM

## 2020-07-10 DIAGNOSIS — Z79899 Other long term (current) drug therapy: Secondary | ICD-10-CM | POA: Diagnosis not present

## 2020-07-10 DIAGNOSIS — D696 Thrombocytopenia, unspecified: Secondary | ICD-10-CM | POA: Insufficient documentation

## 2020-07-10 DIAGNOSIS — Z79811 Long term (current) use of aromatase inhibitors: Secondary | ICD-10-CM | POA: Insufficient documentation

## 2020-07-10 DIAGNOSIS — C50412 Malignant neoplasm of upper-outer quadrant of left female breast: Secondary | ICD-10-CM | POA: Insufficient documentation

## 2020-07-10 DIAGNOSIS — Z5112 Encounter for antineoplastic immunotherapy: Secondary | ICD-10-CM | POA: Diagnosis present

## 2020-07-10 DIAGNOSIS — Z923 Personal history of irradiation: Secondary | ICD-10-CM | POA: Insufficient documentation

## 2020-07-10 DIAGNOSIS — D72819 Decreased white blood cell count, unspecified: Secondary | ICD-10-CM | POA: Insufficient documentation

## 2020-07-10 LAB — CMP (CANCER CENTER ONLY)
ALT: 27 U/L (ref 0–44)
AST: 41 U/L (ref 15–41)
Albumin: 3.5 g/dL (ref 3.5–5.0)
Alkaline Phosphatase: 181 U/L — ABNORMAL HIGH (ref 38–126)
Anion gap: 4 — ABNORMAL LOW (ref 5–15)
BUN: 16 mg/dL (ref 8–23)
CO2: 30 mmol/L (ref 22–32)
Calcium: 9.4 mg/dL (ref 8.9–10.3)
Chloride: 105 mmol/L (ref 98–111)
Creatinine: 0.82 mg/dL (ref 0.44–1.00)
GFR, Est AFR Am: >60 mL/min (ref >60)
GFR, Estimated: >60 mL/min (ref >60)
Glucose, Bld: 99 mg/dL (ref 70–99)
Potassium: 3.6 mmol/L (ref 3.5–5.1)
Sodium: 139 mmol/L (ref 135–145)
Total Bilirubin: 0.6 mg/dL (ref 0.3–1.2)
Total Protein: 6.9 g/dL (ref 6.5–8.1)

## 2020-07-10 LAB — CBC WITH DIFFERENTIAL (CANCER CENTER ONLY)
Abs Immature Granulocytes: 0.01 10*3/uL (ref 0.00–0.07)
Basophils Absolute: 0 10*3/uL (ref 0.0–0.1)
Basophils Relative: 1 %
Eosinophils Absolute: 0.1 10*3/uL (ref 0.0–0.5)
Eosinophils Relative: 3 %
HCT: 30.1 % — ABNORMAL LOW (ref 36.0–46.0)
Hemoglobin: 10.1 g/dL — ABNORMAL LOW (ref 12.0–15.0)
Immature Granulocytes: 0 %
Lymphocytes Relative: 43 %
Lymphs Abs: 1.1 10*3/uL (ref 0.7–4.0)
MCH: 35.7 pg — ABNORMAL HIGH (ref 26.0–34.0)
MCHC: 33.6 g/dL (ref 30.0–36.0)
MCV: 106.4 fL — ABNORMAL HIGH (ref 80.0–100.0)
Monocytes Absolute: 0.2 10*3/uL (ref 0.1–1.0)
Monocytes Relative: 9 %
Neutro Abs: 1.1 10*3/uL — ABNORMAL LOW (ref 1.7–7.7)
Neutrophils Relative %: 44 %
Platelet Count: 88 10*3/uL — ABNORMAL LOW (ref 150–400)
RBC: 2.83 MIL/uL — ABNORMAL LOW (ref 3.87–5.11)
RDW: 13.9 % (ref 11.5–15.5)
WBC Count: 2.6 10*3/uL — ABNORMAL LOW (ref 4.0–10.5)
nRBC: 0 % (ref 0.0–0.2)

## 2020-07-10 MED ORDER — SODIUM CHLORIDE 0.9% FLUSH
10.0000 mL | INTRAVENOUS | Status: DC | PRN
  Administered 2020-07-10: 10 mL
  Filled 2020-07-10: qty 10

## 2020-07-10 MED ORDER — HEPARIN SOD (PORK) LOCK FLUSH 100 UNIT/ML IV SOLN
500.0000 [IU] | Freq: Once | INTRAVENOUS | Status: AC | PRN
  Administered 2020-07-10: 500 [IU]
  Filled 2020-07-10: qty 5

## 2020-07-10 MED ORDER — LEVETIRACETAM 250 MG PO TABS: 250.0000 mg | ORAL_TABLET | Freq: Two times a day (BID) | ORAL | Status: DC

## 2020-07-10 MED ORDER — TRASTUZUMAB-DKST CHEMO 150 MG IV SOLR
6.0000 mg/kg | Freq: Once | INTRAVENOUS | Status: AC
  Administered 2020-07-10: 399 mg via INTRAVENOUS
  Filled 2020-07-10: qty 19

## 2020-07-10 MED ORDER — ACETAMINOPHEN 325 MG PO TABS
ORAL_TABLET | ORAL | Status: AC
  Filled 2020-07-10: qty 2

## 2020-07-10 MED ORDER — DIPHENHYDRAMINE HCL 25 MG PO CAPS
50.0000 mg | ORAL_CAPSULE | Freq: Once | ORAL | Status: AC
  Administered 2020-07-10: 50 mg via ORAL

## 2020-07-10 MED ORDER — SODIUM CHLORIDE 0.9% FLUSH
10.0000 mL | Freq: Once | INTRAVENOUS | Status: AC
  Administered 2020-07-10: 10 mL
  Filled 2020-07-10: qty 10

## 2020-07-10 MED ORDER — ACETAMINOPHEN 325 MG PO TABS
650.0000 mg | ORAL_TABLET | Freq: Once | ORAL | Status: AC
  Administered 2020-07-10: 650 mg via ORAL

## 2020-07-10 MED ORDER — DIPHENHYDRAMINE HCL 25 MG PO CAPS
ORAL_CAPSULE | ORAL | Status: AC
  Filled 2020-07-10: qty 2

## 2020-07-10 MED ORDER — SODIUM CHLORIDE 0.9 % IV SOLN
Freq: Once | INTRAVENOUS | Status: AC
  Filled 2020-07-10: qty 250

## 2020-07-10 NOTE — Patient Instructions (Signed)
[facility] Discharge Instructions for Patients Receiving Chemotherapy  Today you received the following chemotherapy agents: Trastuzumab  To help prevent nausea and vomiting after your treatment, we encourage you to take your nausea medication Zofran 8mg  every 8 hours as needed for nausea or vomiting   If you develop nausea and vomiting that is not controlled by your nausea medication, call the clinic.   BELOW ARE SYMPTOMS THAT SHOULD BE REPORTED IMMEDIATELY:  *FEVER GREATER THAN 100.5 F  *CHILLS WITH OR WITHOUT FEVER  NAUSEA AND VOMITING THAT IS NOT CONTROLLED WITH YOUR NAUSEA MEDICATION  *UNUSUAL SHORTNESS OF BREATH  *UNUSUAL BRUISING OR BLEEDING  TENDERNESS IN MOUTH AND THROAT WITH OR WITHOUT PRESENCE OF ULCERS  *URINARY PROBLEMS  *BOWEL PROBLEMS  UNUSUAL RASH Items with * indicate a potential emergency and should be followed up as soon as possible.  Feel free to call the clinic should you have any questions or concerns. The clinic phone number is [phone].  Please show the [facility] at check-in to the Emergency Department and triage nurse.

## 2020-07-10 NOTE — Assessment & Plan Note (Signed)
Current treatment: Abemaciclibwith letrozole along with Herceptin(to be given every 4 weeks)and Xgeva started 06/15/2018 05/25/2020: CT chest/abdomen/pelvis shows no evidence of disease, bone scan shows no change.    Toxicities:Cytopenias Thrombocytopenia: Monitoring closely, platelet count 79 Anemia: Today's hemoglobin is9.8 Leukopenia: Today's ANC 1.1  I encouraged her to continue abemaciclib.  Return to clinic monthly for Herceptin and every other month with labs for follow-up to see me

## 2020-07-14 ENCOUNTER — Telehealth: Payer: Self-pay | Admitting: Hematology and Oncology

## 2020-07-14 ENCOUNTER — Other Ambulatory Visit: Payer: Self-pay | Admitting: Surgery

## 2020-07-14 NOTE — Telephone Encounter (Signed)
Scheduled appts per 9/28 sch msg. Pt confirmed appt date and time.

## 2020-07-15 ENCOUNTER — Other Ambulatory Visit: Payer: Self-pay | Admitting: Family Medicine

## 2020-07-15 DIAGNOSIS — C50919 Malignant neoplasm of unspecified site of unspecified female breast: Secondary | ICD-10-CM

## 2020-07-15 DIAGNOSIS — I10 Essential (primary) hypertension: Secondary | ICD-10-CM

## 2020-07-17 ENCOUNTER — Ambulatory Visit: Payer: Self-pay | Admitting: Surgery

## 2020-07-22 ENCOUNTER — Encounter (HOSPITAL_BASED_OUTPATIENT_CLINIC_OR_DEPARTMENT_OTHER): Payer: Self-pay | Admitting: Surgery

## 2020-07-22 ENCOUNTER — Other Ambulatory Visit: Payer: Self-pay

## 2020-07-22 NOTE — Progress Notes (Signed)
chart reviewed by Dr. Fransisco Beau and will proceed with surgery as scheduled with no additional cardiac clearance needed. Recent Echo 03/2020 in chart.

## 2020-07-23 ENCOUNTER — Telehealth: Payer: Self-pay

## 2020-07-23 NOTE — Telephone Encounter (Signed)
   [facility] Pre-operative Risk Assessment    Request for surgical clearance:  1. What type of surgery is being performed? WIDE EXCISION OF LEFT MASTECTOMY INCISION   2. When is this surgery scheduled? 07-29-2020   3. What type of clearance is required (medical clearance vs. Pharmacy clearance to hold med vs. Both)? MEDICAL  4. Are there any medications that need to be held prior to surgery and how long? NONE LISTED   5. Practice name and name of physician performing surgery? [facility] DR MATTHEW TSUEI  ATTN: KELLY,LPN  6. What is the office phone number? [phone]   7.   What is the office fax number? [phone]  8.   Anesthesia type (None, local, MAC, general) ? [place]

## 2020-07-23 NOTE — Telephone Encounter (Signed)
Called pt and cancelled appt per requesting providers office.

## 2020-07-23 NOTE — Telephone Encounter (Signed)
Primary Cardiologist:James Hochrein, MD  Chart reviewed as part of pre-operative protocol coverage. Because of [name] Riley's past medical history and time since last visit, he/she will require a follow-up visit in order to better assess preoperative cardiovascular risk.  Pre-op covering staff: - Please schedule appointment and call patient to inform them. - Please contact requesting surgeon's office via preferred method (i.e, phone, fax) to inform them of need for appointment prior to surgery.  If applicable, this message will also be routed to pharmacy pool and/or primary cardiologist for input on holding anticoagulant/antiplatelet agent as requested below so that this information is available at time of patient's appointment.   Deberah Pelton, NP  07/23/2020, 12:52 PM

## 2020-07-23 NOTE — Telephone Encounter (Signed)
Follow Up:     Claiborne Billings called and said pt did not need an appointment . Pt have already been cleared for surgery by pre-admission.

## 2020-07-23 NOTE — Telephone Encounter (Signed)
Called pt and scheduled appt for cardiac clearance for 10-8 since his procedure is 10-13

## 2020-07-24 ENCOUNTER — Ambulatory Visit: Payer: Medicare Other | Admitting: General Practice

## 2020-07-27 ENCOUNTER — Ambulatory Visit: Payer: Medicare Other | Admitting: Family Medicine

## 2020-07-27 ENCOUNTER — Other Ambulatory Visit (HOSPITAL_COMMUNITY)
Admission: RE | Admit: 2020-07-27 | Discharge: 2020-07-27 | Disposition: A | Discharge status: Home / Self Care | Payer: Medicare Other | Source: Ambulatory Visit | Attending: Surgery | Admitting: Surgery

## 2020-07-27 ENCOUNTER — Other Ambulatory Visit (HOSPITAL_COMMUNITY): Payer: Medicare Other

## 2020-07-27 DIAGNOSIS — Z01812 Encounter for preprocedural laboratory examination: Secondary | ICD-10-CM | POA: Insufficient documentation

## 2020-07-27 DIAGNOSIS — Z20822 Contact with and (suspected) exposure to covid-19: Secondary | ICD-10-CM | POA: Insufficient documentation

## 2020-07-27 LAB — SARS CORONAVIRUS 2 (TAT 6-24 HRS): SARS Coronavirus 2: NEGATIVE

## 2020-07-28 ENCOUNTER — Other Ambulatory Visit (HOSPITAL_COMMUNITY): Payer: Medicare Other

## 2020-07-28 ENCOUNTER — Encounter (HOSPITAL_BASED_OUTPATIENT_CLINIC_OR_DEPARTMENT_OTHER)
Admission: RE | Admit: 2020-07-28 | Discharge: 2020-07-28 | Disposition: A | Discharge status: Home / Self Care | Payer: Medicare Other | Source: Ambulatory Visit | Attending: Surgery | Admitting: Surgery

## 2020-07-28 DIAGNOSIS — I252 Old myocardial infarction: Secondary | ICD-10-CM | POA: Diagnosis not present

## 2020-07-28 DIAGNOSIS — Z91018 Allergy to other foods: Secondary | ICD-10-CM | POA: Diagnosis not present

## 2020-07-28 DIAGNOSIS — C50412 Malignant neoplasm of upper-outer quadrant of left female breast: Secondary | ICD-10-CM | POA: Diagnosis not present

## 2020-07-28 DIAGNOSIS — Z9013 Acquired absence of bilateral breasts and nipples: Secondary | ICD-10-CM | POA: Diagnosis not present

## 2020-07-28 DIAGNOSIS — I2510 Atherosclerotic heart disease of native coronary artery without angina pectoris: Secondary | ICD-10-CM | POA: Diagnosis not present

## 2020-07-28 DIAGNOSIS — C7989 Secondary malignant neoplasm of other specified sites: Secondary | ICD-10-CM | POA: Diagnosis not present

## 2020-07-28 DIAGNOSIS — C50912 Malignant neoplasm of unspecified site of left female breast: Secondary | ICD-10-CM | POA: Diagnosis present

## 2020-07-28 DIAGNOSIS — I509 Heart failure, unspecified: Secondary | ICD-10-CM | POA: Diagnosis not present

## 2020-07-28 DIAGNOSIS — L720 Epidermal cyst: Secondary | ICD-10-CM | POA: Diagnosis not present

## 2020-07-28 DIAGNOSIS — I110 Hypertensive heart disease with heart failure: Secondary | ICD-10-CM | POA: Diagnosis not present

## 2020-07-28 DIAGNOSIS — Z79899 Other long term (current) drug therapy: Secondary | ICD-10-CM | POA: Diagnosis not present

## 2020-07-28 DIAGNOSIS — E039 Hypothyroidism, unspecified: Secondary | ICD-10-CM | POA: Diagnosis not present

## 2020-07-28 DIAGNOSIS — Z9221 Personal history of antineoplastic chemotherapy: Secondary | ICD-10-CM | POA: Diagnosis not present

## 2020-07-28 NOTE — Progress Notes (Signed)
Surgical soap given with instructions, pt verbalized understanding.  

## 2020-07-29 ENCOUNTER — Ambulatory Visit (HOSPITAL_BASED_OUTPATIENT_CLINIC_OR_DEPARTMENT_OTHER)
Admission: RE | Admit: 2020-07-29 | Discharge: 2020-07-29 | Disposition: A | Discharge status: Home / Self Care | Payer: Medicare Other | Attending: Surgery | Admitting: Surgery

## 2020-07-29 ENCOUNTER — Ambulatory Visit (HOSPITAL_BASED_OUTPATIENT_CLINIC_OR_DEPARTMENT_OTHER): Payer: Medicare Other | Admitting: Anesthesiology

## 2020-07-29 ENCOUNTER — Other Ambulatory Visit (HOSPITAL_BASED_OUTPATIENT_CLINIC_OR_DEPARTMENT_OTHER): Payer: Self-pay | Admitting: Surgery

## 2020-07-29 ENCOUNTER — Encounter (HOSPITAL_BASED_OUTPATIENT_CLINIC_OR_DEPARTMENT_OTHER): Payer: Self-pay | Admitting: Surgery

## 2020-07-29 ENCOUNTER — Encounter (HOSPITAL_BASED_OUTPATIENT_CLINIC_OR_DEPARTMENT_OTHER)
Admission: RE | Disposition: A | Discharge status: Home / Self Care | Payer: Self-pay | Source: Home / Self Care | Attending: Surgery

## 2020-07-29 ENCOUNTER — Other Ambulatory Visit: Payer: Self-pay

## 2020-07-29 DIAGNOSIS — Z9013 Acquired absence of bilateral breasts and nipples: Secondary | ICD-10-CM | POA: Diagnosis not present

## 2020-07-29 DIAGNOSIS — E039 Hypothyroidism, unspecified: Secondary | ICD-10-CM | POA: Insufficient documentation

## 2020-07-29 DIAGNOSIS — L720 Epidermal cyst: Secondary | ICD-10-CM | POA: Insufficient documentation

## 2020-07-29 DIAGNOSIS — I2510 Atherosclerotic heart disease of native coronary artery without angina pectoris: Secondary | ICD-10-CM | POA: Insufficient documentation

## 2020-07-29 DIAGNOSIS — I110 Hypertensive heart disease with heart failure: Secondary | ICD-10-CM | POA: Insufficient documentation

## 2020-07-29 DIAGNOSIS — Z79899 Other long term (current) drug therapy: Secondary | ICD-10-CM | POA: Insufficient documentation

## 2020-07-29 DIAGNOSIS — I252 Old myocardial infarction: Secondary | ICD-10-CM | POA: Insufficient documentation

## 2020-07-29 DIAGNOSIS — C50412 Malignant neoplasm of upper-outer quadrant of left female breast: Secondary | ICD-10-CM | POA: Diagnosis not present

## 2020-07-29 DIAGNOSIS — C7989 Secondary malignant neoplasm of other specified sites: Secondary | ICD-10-CM | POA: Insufficient documentation

## 2020-07-29 DIAGNOSIS — Z91018 Allergy to other foods: Secondary | ICD-10-CM | POA: Insufficient documentation

## 2020-07-29 DIAGNOSIS — I509 Heart failure, unspecified: Secondary | ICD-10-CM | POA: Insufficient documentation

## 2020-07-29 DIAGNOSIS — Z9221 Personal history of antineoplastic chemotherapy: Secondary | ICD-10-CM | POA: Insufficient documentation

## 2020-07-29 HISTORY — PX: BREAST CYST EXCISION: SHX579

## 2020-07-29 SURGERY — EXCISION, CYST, BREAST
Anesthesia: Monitor Anesthesia Care | Site: Breast | Laterality: Left

## 2020-07-29 MED ORDER — FENTANYL CITRATE (PF) 100 MCG/2ML IJ SOLN: 25.0000 ug | INTRAMUSCULAR | Status: DC | PRN

## 2020-07-29 MED ORDER — OXYCODONE HCL 5 MG PO TABS: 5.0000 mg | ORAL_TABLET | Freq: Once | ORAL | Status: DC | PRN

## 2020-07-29 MED ORDER — PROPOFOL 10 MG/ML IV BOLUS
INTRAVENOUS | Status: AC
  Filled 2020-07-29: qty 20

## 2020-07-29 MED ORDER — FENTANYL CITRATE (PF) 100 MCG/2ML IJ SOLN
INTRAMUSCULAR | Status: AC
  Filled 2020-07-29: qty 2

## 2020-07-29 MED ORDER — DEXAMETHASONE SODIUM PHOSPHATE 10 MG/ML IJ SOLN
INTRAMUSCULAR | Status: DC | PRN
  Administered 2020-07-29: 10 mg via INTRAVENOUS

## 2020-07-29 MED ORDER — CEFAZOLIN SODIUM-DEXTROSE 2-4 GM/100ML-% IV SOLN
2.0000 g | INTRAVENOUS | Status: AC
  Administered 2020-07-29: 2 g via INTRAVENOUS

## 2020-07-29 MED ORDER — CHLORHEXIDINE GLUCONATE CLOTH 2 % EX PADS: 6.0000 | MEDICATED_PAD | Freq: Once | CUTANEOUS | Status: DC

## 2020-07-29 MED ORDER — ACETAMINOPHEN 160 MG/5ML PO SOLN: 325.0000 mg | ORAL | Status: DC | PRN

## 2020-07-29 MED ORDER — PROPOFOL 500 MG/50ML IV EMUL
INTRAVENOUS | Status: DC | PRN
  Administered 2020-07-29: 150 ug/kg/min via INTRAVENOUS

## 2020-07-29 MED ORDER — LIDOCAINE 2% (20 MG/ML) 5 ML SYRINGE
INTRAMUSCULAR | Status: AC
  Filled 2020-07-29: qty 5

## 2020-07-29 MED ORDER — LACTATED RINGERS IV SOLN: INTRAVENOUS | Status: DC

## 2020-07-29 MED ORDER — ACETAMINOPHEN 325 MG PO TABS: 325.0000 mg | ORAL_TABLET | ORAL | Status: DC | PRN

## 2020-07-29 MED ORDER — 0.9 % SODIUM CHLORIDE (POUR BTL) OPTIME
TOPICAL | Status: DC | PRN
  Administered 2020-07-29: 500 mL

## 2020-07-29 MED ORDER — BUPIVACAINE-EPINEPHRINE 0.25% -1:200000 IJ SOLN
INTRAMUSCULAR | Status: DC | PRN
  Administered 2020-07-29: 15 mL

## 2020-07-29 MED ORDER — PROPOFOL 10 MG/ML IV BOLUS
INTRAVENOUS | Status: DC | PRN
  Administered 2020-07-29: 50 mg via INTRAVENOUS

## 2020-07-29 MED ORDER — ONDANSETRON HCL 4 MG/2ML IJ SOLN
INTRAMUSCULAR | Status: DC | PRN
  Administered 2020-07-29: 4 mg via INTRAVENOUS

## 2020-07-29 MED ORDER — HYDROCODONE-ACETAMINOPHEN 5-325 MG PO TABS: 1.0000 | ORAL_TABLET | Freq: Four times a day (QID) | ORAL | 0 refills | Status: DC | PRN

## 2020-07-29 MED ORDER — MEPERIDINE HCL 25 MG/ML IJ SOLN: 6.2500 mg | INTRAMUSCULAR | Status: DC | PRN

## 2020-07-29 MED ORDER — OXYCODONE HCL 5 MG/5ML PO SOLN: 5.0000 mg | Freq: Once | ORAL | Status: DC | PRN

## 2020-07-29 MED ORDER — KETOROLAC TROMETHAMINE 30 MG/ML IJ SOLN
INTRAMUSCULAR | Status: AC
  Filled 2020-07-29: qty 1

## 2020-07-29 MED ORDER — ONDANSETRON HCL 4 MG/2ML IJ SOLN
INTRAMUSCULAR | Status: AC
  Filled 2020-07-29: qty 4

## 2020-07-29 MED ORDER — DEXAMETHASONE SODIUM PHOSPHATE 10 MG/ML IJ SOLN
INTRAMUSCULAR | Status: AC
  Filled 2020-07-29: qty 3

## 2020-07-29 MED ORDER — CEFAZOLIN SODIUM-DEXTROSE 2-4 GM/100ML-% IV SOLN
INTRAVENOUS | Status: AC
  Filled 2020-07-29: qty 100

## 2020-07-29 MED ORDER — ONDANSETRON HCL 4 MG/2ML IJ SOLN: 4.0000 mg | Freq: Once | INTRAMUSCULAR | Status: DC | PRN

## 2020-07-29 MED FILL — HYDROCODON-APAP 5-325: 5-325 | 3 days supply | Qty: 15 | Fill #0

## 2020-07-29 SURGICAL SUPPLY — 47 items
APPLICATOR COTTON TIP 6 STRL (MISCELLANEOUS) IMPLANT
APPLICATOR COTTON TIP 6IN STRL (MISCELLANEOUS)
APPLIER CLIP 9.375 MED OPEN (MISCELLANEOUS)
BENZOIN TINCTURE PRP APPL 2/3 (GAUZE/BANDAGES/DRESSINGS) ×3 IMPLANT
BLADE HEX COATED 2.75 (ELECTRODE) ×3 IMPLANT
BLADE SURG 15 STRL LF DISP TIS (BLADE) ×1 IMPLANT
BLADE SURG 15 STRL SS (BLADE) ×3
CANISTER SUCT 1200ML W/VALVE (MISCELLANEOUS) ×3 IMPLANT
CHLORAPREP W/TINT 26 (MISCELLANEOUS) ×3 IMPLANT
CLIP APPLIE 9.375 MED OPEN (MISCELLANEOUS) IMPLANT
CLOSURE WOUND 1/2 X4 (GAUZE/BANDAGES/DRESSINGS) ×1
COVER BACK TABLE 60X90IN (DRAPES) ×3 IMPLANT
COVER MAYO STAND STRL (DRAPES) ×3 IMPLANT
COVER WAND RF STERILE (DRAPES) IMPLANT
DECANTER SPIKE VIAL GLASS SM (MISCELLANEOUS) IMPLANT
DRAPE LAPAROTOMY 100X72 PEDS (DRAPES) ×3 IMPLANT
DRAPE UTILITY XL STRL (DRAPES) ×3 IMPLANT
DRSG TEGADERM 4X4.75 (GAUZE/BANDAGES/DRESSINGS) ×3 IMPLANT
ELECT REM PT RETURN 9FT ADLT (ELECTROSURGICAL) ×3
ELECTRODE REM PT RTRN 9FT ADLT (ELECTROSURGICAL) ×1 IMPLANT
GAUZE SPONGE 4X4 12PLY STRL LF (GAUZE/BANDAGES/DRESSINGS) ×3 IMPLANT
GLOVE BIO SURGEON STRL SZ7 (GLOVE) ×3 IMPLANT
GLOVE BIOGEL PI IND STRL 7.5 (GLOVE) ×1 IMPLANT
GLOVE BIOGEL PI INDICATOR 7.5 (GLOVE) ×2
GOWN STRL REUS W/ TWL LRG LVL3 (GOWN DISPOSABLE) ×2 IMPLANT
GOWN STRL REUS W/TWL LRG LVL3 (GOWN DISPOSABLE) ×6
KIT MARKER MARGIN INK (KITS) IMPLANT
NEEDLE HYPO 25X1 1.5 SAFETY (NEEDLE) ×3 IMPLANT
NS IRRIG 1000ML POUR BTL (IV SOLUTION) ×3 IMPLANT
PACK BASIN DAY SURGERY FS (CUSTOM PROCEDURE TRAY) ×3 IMPLANT
PENCIL SMOKE EVACUATOR (MISCELLANEOUS) ×3 IMPLANT
SLEEVE SCD COMPRESS KNEE MED (MISCELLANEOUS) ×3 IMPLANT
SPONGE LAP 4X18 RFD (DISPOSABLE) ×3 IMPLANT
STRIP CLOSURE SKIN 1/2X4 (GAUZE/BANDAGES/DRESSINGS) ×2 IMPLANT
SUT CHROMIC 3 0 SH 27 (SUTURE) IMPLANT
SUT ETHILON 3 0 PS 1 (SUTURE) ×3 IMPLANT
SUT MON AB 4-0 PC3 18 (SUTURE) ×3 IMPLANT
SUT SILK 2 0 SH (SUTURE) IMPLANT
SUT VIC AB 3-0 SH 27 (SUTURE) ×3
SUT VIC AB 3-0 SH 27X BRD (SUTURE) ×1 IMPLANT
SUT VICRYL 3-0 CR8 SH (SUTURE) ×3 IMPLANT
SYR CONTROL 10ML LL (SYRINGE) ×3 IMPLANT
TOWEL GREEN STERILE FF (TOWEL DISPOSABLE) ×3 IMPLANT
TRAY FAXITRON CT DISP (TRAY / TRAY PROCEDURE) IMPLANT
TUBE CONNECTING 20'X1/4 (TUBING) ×1
TUBE CONNECTING 20X1/4 (TUBING) ×2 IMPLANT
YANKAUER SUCT BULB TIP NO VENT (SUCTIONS) ×3 IMPLANT

## 2020-07-29 NOTE — Discharge Instructions (Signed)
[facility] Office Phone Number [phone] POST OP INSTRUCTIONS  Always review your discharge instruction sheet given to you by the facility where your surgery was performed.  IF YOU HAVE DISABILITY OR FAMILY LEAVE FORMS, YOU MUST BRING THEM TO THE OFFICE FOR PROCESSING.  DO NOT GIVE THEM TO YOUR DOCTOR.  1. A prescription for pain medication may be given to you upon discharge.  Take your pain medication as prescribed, if needed.  If narcotic pain medicine is not needed, then you may take acetaminophen (Tylenol) or ibuprofen (Advil) as needed. 2. Take your usually prescribed medications unless otherwise directed 3. If you need a refill on your pain medication, please contact your pharmacy.  They will contact our office to request authorization.  Prescriptions will not be filled after 5pm or on week-ends. 4. You should eat very light the first 24 hours after surgery, such as soup, crackers, pudding, etc.  Resume your normal diet the day after surgery. 5. Most patients will experience some swelling and bruising in the chest.  Ice packs will help.  Swelling and bruising can take several days to resolve.  6. It is common to experience some constipation if taking pain medication after surgery.  Increasing fluid intake and taking a stool softener will usually help or prevent this problem from occurring.  A mild laxative (Milk of Magnesia or Miralax) should be taken according to package directions if there are no bowel movements after 48 hours. 7. Wait 48 hours before showering.  After the first shower, change the dressing daily.  This requires only some clean gauze and some tape to hold it in place over the sutures.    Any sutures will be removed at the office during your follow-up visit. 8. ACTIVITIES:  You may resume regular daily activities (gradually increasing) beginning the next day.    You may have sexual intercourse when it is comfortable. a. You may drive when you no longer are  taking prescription pain medication, you can comfortably wear a seatbelt, and you can safely maneuver your car and apply brakes. b. RETURN TO WORK:  ______________________________________________________________________________________ 9. You should see your doctor in the office for a follow-up appointment approximately two weeks after your surgery.  Your doctor's nurse will typically make your follow-up appointment when she calls you with your pathology report.  Expect your pathology report 2-3 business days after your surgery.  You may call to check if you do not hear from [name] after three days. 10. OTHER INSTRUCTIONS: _______________________________________________________________________________________________ _____________________________________________________________________________________________________________________________________ _____________________________________________________________________________________________________________________________________ _____________________________________________________________________________________________________________________________________  WHEN TO CALL YOUR DOCTOR: 1. Fever over 101.0 2. Nausea and/or vomiting. 3. Extreme swelling or bruising. 4. Continued bleeding from incision. 5. Increased pain, redness, or drainage from the incision.  The clinic staff is available to answer your questions during regular business hours.  Please don't hesitate to call and ask to speak to one of the nurses for clinical concerns.  If you have a medical emergency, go to the nearest emergency room or call 911.  A surgeon from [facility] is always on call at the hospital.  For further questions, please visit [email or web address]     Post Anesthesia Home Care Instructions  Activity: Get plenty of rest for the remainder of the day. A responsible individual must stay with you for 24 hours following the procedure.  For the next  24 hours, DO NOT: -Drive a car -Paediatric nurse -Drink alcoholic beverages -Take any medication unless instructed by your physician -Make any legal decisions or sign  important papers.  Meals: Start with liquid foods such as gelatin or soup. Progress to regular foods as tolerated. Avoid greasy, spicy, heavy foods. If nausea and/or vomiting occur, drink only clear liquids until the nausea and/or vomiting subsides. Call your physician if vomiting continues.  Special Instructions/Symptoms: Your throat may feel dry or sore from the anesthesia or the breathing tube placed in your throat during surgery. If this causes discomfort, gargle with warm salt water. The discomfort should disappear within 24 hours.  If you had a scopolamine patch placed behind your ear for the management of post- operative nausea and/or vomiting:  1. The medication in the patch is effective for 72 hours, after which it should be removed.  Wrap patch in a tissue and discard in the trash. Wash hands thoroughly with soap and water. 2. You may remove the patch earlier than 72 hours if you experience unpleasant side effects which may include dry mouth, dizziness or visual disturbances. 3. Avoid touching the patch. Wash your hands with soap and water after contact with the patch.

## 2020-07-29 NOTE — Anesthesia Preprocedure Evaluation (Addendum)
Anesthesia Evaluation  Patient identified by MRN, date of birth, ID band Patient awake    Reviewed: Allergy & Precautions, H&P , NPO status , Patient's Chart, lab work & pertinent test results, reviewed documented beta blocker date and time   History of Anesthesia Complications (+) PONV and history of anesthetic complications  Airway Mallampati: I  TM Distance: >3 FB Neck ROM: Full    Dental  (+) Dental Advisory Given   Pulmonary    breath sounds clear to auscultation       Cardiovascular hypertension, Pt. on medications and Pt. on home beta blockers + CAD and + Past MI   Rhythm:Regular Rate:Normal     Neuro/Psych    GI/Hepatic   Endo/Other  Hypothyroidism   Renal/GU      Musculoskeletal   Abdominal   Peds  Hematology   Anesthesia Other Findings ECHO 21 Left ventricular ejection fraction, by estimation, is 55 to 60%. The left ventricle has normal function. The left ventricle has no regional wall motion abnormalities. The left ventricular internal cavity size was normal in size. There is mild left ventricular hypertrophy. Left ventricular diastolic parameters are Indeterminate.  Nuclear stress test 10/24/14:  1. No definitive scintigraphic evidence of prior infarction or pharmacologically induced ischemia. 2. Normal left ventricular wall motion. 3. Left ventricular ejection fraction 61% 4. Low-risk stress test findings  Cardiac cath 09/26/00:  - LM: Normal - LAD: Normal in its proximal segment. Mid segment there was a dissection plane with subsequent occlusion of the distal and apical LAD. - CX: Normal - RCA: Dominant, normal  Reproductive/Obstetrics                            Anesthesia Physical  Anesthesia Plan  ASA: III  Anesthesia Plan: MAC   Post-op Pain Management:    Induction: Intravenous  PONV Risk Score and Plan: 3 and Ondansetron, Dexamethasone, Midazolam,  Treatment may vary due to age or medical condition and Scopolamine patch - Pre-op  Airway Management Planned: Nasal Cannula, Natural Airway, Simple Face Mask and Mask  Additional Equipment:   Intra-op Plan:   Post-operative Plan:   Informed Consent:     Dental advisory given  Plan Discussed with: CRNA, Anesthesiologist and Surgeon  Anesthesia Plan Comments: ( )       Anesthesia Quick Evaluation

## 2020-07-29 NOTE — H&P (Signed)
The patient is status post excision of several chest wall nodules from her left chest on 06/22/17. Pathology confirmed that these were recurrent breast cancer nodules. The wound was closed with multiple subcutaneous Vicryl sutures as well as staples in the skin. The patient presented to the office on 06/29/17 with redness across her chest, drainage from the wound, and a general feeling of malaise. She was admitted for intravenous antibiotics and dressing changes. On 07/01/17 we went to the operating room and opened the wound widely and irrigated it. The wound is being cared for with daily dressing changes. While she was in the hospital we noted that her liver function tests had increased dramatically. GI was consulted. We stopped her [place]. Liver function tests were rechecked and have returned almost to normal. The patient is feeling much better. She was discharged home on 07/03/17. Her last visit was on 08/07/17.   She is still receiving treatment from Dr. Lindi Adie. Last week, she noticed that the medial part of her left mastectomy incision became thickened and reddened. There was some drainage from this area. Since then it has begun to resolve. She comes in today to have this area checked.   Allergies Mammie Lorenzo, LPN; 9/79/4801 65:53 AM) Renata Caprice (Diagnostic)  Shortness of breath. Allergies Reconciled  No Known Drug Allergies  [07/13/2020]: (Marked as Inactive)  Medication History Mammie Lorenzo, LPN; 7/48/2707 86:75 AM) Melynda Keller (100MG  Tablet, Oral) Active. Acetaminophen (500MG  Tablet, Oral) Active. Vitamin D (Ergocalciferol) (50000UNIT Capsule, Oral) Active. Letrozole (2.5MG  Tablet, Oral) Active. levETIRAcetam (500MG  Tablet, Oral) Active. Levothyroxine Sodium (100MCG Capsule, Oral) Active. Metoprolol Succinate ER (100MG  Tablet ER 24HR, Oral) Active. Multivitamins/Minerals (Oral) Active. (Thera-M) Rosuvastatin Calcium (5MG  Tablet, Oral) Active. Triamcinolone  Acetonide (0.025% Ointment, External) Active. Valsartan (80MG  Tablet, Oral) Active. Valtrex (1GM Tablet, Oral) Active. Medications Reconciled  Vitals Claiborne Billings Dockery LPN; 4/49/2010 07:12 AM) 07/13/2020 10:35 AM Weight: 140.4 lb Height: 66in Body Surface Area: 1.72 m Body Mass Index: 22.66 kg/m  Pulse: 85 (Regular)  BP: 142/70(Sitting, Left Arm, Standard)       Physical Exam Rodman Key K. Wesam Gearhart MD; 07/13/2020 11:01 AM) The physical exam findings are as follows: Note: WDWN in NAD She moves with some discomfort due to pain in her chest and shoulder/ neck.  The left subclavian vein port site appears free of any sign of infection.  Left chest incision shows a 1 cm area at the medial end of the incision. There is a small eschar and minimal erythema around this area. No nodules palpated in the remainder of her left chest or her right chest.    Assessment & Plan Rodman Key K. Natonya Finstad MD; 07/13/2020 11:01 AM) RECURRENT BREAST CANCER, LEFT (C50.912) Current Plans Schedule for Surgery - wide excision of left chest wall nodule.  The surgical procedure has been discussed with the patient.  Potential risks, benefits, alternative treatments, and expected outcomes have been explained.  All of the patient's questions at this time have been answered.  The likelihood of reaching the patient's treatment goal is good.  The patient understand the proposed surgical procedure and wishes to proceed.  Imogene Burn. [name], MD, [facility]  General/ Trauma Surgery   07/29/2020 11:05 AM

## 2020-07-29 NOTE — Op Note (Signed)
Preop diagnosis: Metastatic recurrent breast carcinoma left chest Postop diagnosis: Same Procedure performed: Excision of chest wall mass left mastectomy incision Surgeon:Jeffery Gammell K Rithik Odea Anesthesia local MAC Indications: This is a 69 year old female status post bilateral mastectomies as well as chemotherapy.  She has had several previous excisions for multiple recurrent nodules and her left chest wall.  Recently she presented with a another nodule.  This was biopsied in the office and revealed recurrent breast carcinoma.  She presents now for wide excision.  Description of procedure: The patient is brought to the operating room placed in the supine position on the operating room table.  Her left chest was prepped with ChloraPrep and draped sterile fashion.  We infiltrated the area around the nodule with 0.25% Marcaine with epinephrine.  I raised a 4 cm long elliptical incision around this area.  We dissected all the way down to the chest wall.  I then dissected the specimen off the underlying muscle.  This was sent for pathologic examination.  We inspected carefully for hemostasis.  I undermined the skin flaps superiorly and inferiorly to allow some mobilization although this part of her chest is quite thickened and tight due to the previous treatments.  We placed a deep layer of 3-0 Vicryl to pull the dermis together.  The skin was closed with multiple interrupted horizontal mattress sutures of 3-0 Ethilon.  Xeroform and dry gauze are applied.  The patient is then awakened and brought to the recovery room in stable condition.  All sponge, instrument, and needle counts are correct.  Imogene Burn. [name], MD, [facility]  General/ Trauma Surgery   07/29/2020 1:20 PM

## 2020-07-29 NOTE — Anesthesia Postprocedure Evaluation (Signed)
Anesthesia Post Note  Patient: [name] Riley  Procedure(s) Performed: WIDE EXCISION OF LEFT MASTECTOMY INCISION (Left Breast)     Patient location during evaluation: PACU Anesthesia Type: MAC Level of consciousness: awake and alert Pain management: pain level controlled Vital Signs Assessment: post-procedure vital signs reviewed and stable Respiratory status: spontaneous breathing, nonlabored ventilation, respiratory function stable and patient connected to nasal cannula oxygen Cardiovascular status: stable and blood pressure returned to baseline Postop Assessment: no apparent nausea or vomiting Anesthetic complications: no   No complications documented.  Last Vitals:  Vitals:   07/29/20 1122 07/29/20 1315  BP: (!) 150/70 (!) 149/125  Pulse: 66 71  Resp: 18 17  Temp: 37.1 C 36.6 C  SpO2: 100% 100%    Last Pain:  Vitals:   07/29/20 1122  TempSrc: Oral  PainSc: 0-No pain                 Zeta Bucy

## 2020-07-29 NOTE — Transfer of Care (Signed)
Immediate Anesthesia Transfer of Care Note  Patient: [name] Riley  Procedure(s) Performed: WIDE EXCISION OF LEFT MASTECTOMY INCISION (Left Breast)  Patient Location: PACU  Anesthesia Type:MAC  Level of Consciousness: awake, alert  and oriented  Airway & Oxygen Therapy: Patient Spontanous Breathing and Patient connected to face mask oxygen  Post-op Assessment: Report given to RN and Post -op Vital signs reviewed and stable  Post vital signs: Reviewed and stable  Last Vitals:  Vitals Value Taken Time  BP 149/125 07/29/20 1315  Temp    Pulse 71 07/29/20 1316  Resp 16 07/29/20 1316  SpO2 100 % 07/29/20 1316  Vitals shown include unvalidated device data.  Last Pain:  Vitals:   07/29/20 1122  TempSrc: Oral  PainSc: 0-No pain      Patients Stated Pain Goal: 2 (15/04/13 6438)  Complications: No complications documented.

## 2020-07-30 ENCOUNTER — Encounter (HOSPITAL_BASED_OUTPATIENT_CLINIC_OR_DEPARTMENT_OTHER): Payer: Self-pay | Admitting: Surgery

## 2020-07-31 LAB — SURGICAL PATHOLOGY

## 2020-08-06 NOTE — Progress Notes (Signed)
Patient Care Team: [name], MD as PCP - General (Family Medicine) Minus Breeding, MD as PCP - Cardiology (Cardiology) Nicholas Lose, MD as Consulting Physician (Hematology and Oncology)  DIAGNOSIS:    ICD-10-CM   1. Malignant neoplasm of upper-outer quadrant of left breast in female, estrogen receptor positive (HCC)  C50.412 triamcinolone (KENALOG) 0.025 % ointment   Z17.0   2. Brain metastasis (HCC)  C79.31 triamcinolone (KENALOG) 0.025 % ointment  3. Metastatic breast cancer (HCC)  C50.919 triamcinolone (KENALOG) 0.025 % ointment    SUMMARY OF ONCOLOGIC HISTORY: Oncology History  Breast cancer of upper-outer quadrant of left female breast ([place])  11/14/2011 Surgery   Bilateral mastectomy, prophylactic on the right, left breast IDC 3/18 lymph nodes positive with extracapsular extension ER 89%, PR 81%, HER-2 negative, Ki-67 79% T2 N1 A. stage IIB   12/13/2011 - 06/28/2012 Chemotherapy   4 cycles of FEC followed by 4 cycles of Taxotere   07/17/2012 - 08/22/2012 Radiation Therapy   Adjuvant radiation therapy   08/22/2012 - 03/16/2016 Anti-estrogen oral therapy   Arimidex 1 mg daily   03/16/2016 Relapse/Recurrence   Subcutaneous nodule excision left chest: Infiltrating carcinoma breast primary, ER positive, PR negative   03/29/2016 Imaging   CT CAP and bone scan: Lytic lesions T8 vertebral, T1 posterior element, subcutaneous nodule left lateral chest wall, nonspecific lung nodules; Bone scan: Mets to kull, left humerus, left eighth rib, T7/T8, sternum, left acetabulum   04/28/2016 - 06/17/2017 Chemotherapy   Herceptin, lapatinib, Faslodex, Zometa every 4 weeks, lapatinib discontinued in September 2018 due to elevation of LFTs   06/22/2017 Relapse/Recurrence   Surgical excision:Soft tissue mass left lateral chest wall primary breast cancer, soft tissue mass left medial chest wall breast cancer, tumor is within the dermis extending to the subcutaneous adipose tissue and involves  portions of skeletal muscle   06/22/2017 Cancer Staging   Staging form: Breast, AJCC 7th Edition - Pathologic stage from 06/22/2017: Stage IV (TX, NX, M1) - Signed by Nicholas Lose, MD on 12/27/2019   08/2017 - 02/16/2018 Chemotherapy   Faslodex with Herceptin and Perjeta along with Zometa every 4 weeks   03/02/2018 - 05/25/2018 Chemotherapy   Kadcyla   05/11/2018 Imaging   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 Associated vasogenic edema within the underlying right cerebral hemisphere without significant midline shift. Signal abnormality throughout the visualized bone marrow, compatible with osseous metastatic disease.    06/04/2018 Imaging   CT CAP: Right lower lobe lung nodule 7 mm (was 5 mm); multiple bone metastases throughout the spine and ribs sternum scapula and humerus, slightly increased lower thoracic mets, right renal lesion 2.5 cm (was 1.2 cm) right femur met increased from 2.1 cm to 2.9 cm   06/15/2018 -  Anti-estrogen oral therapy   Abemaciclib, Herceptin, letrozole, Xgeva   06/07/2019 Imaging   Progression of brain metastases,2 discrete dural-based lesions involving the posterior right frontal lobe. The more anterior lesion now measures 16.5 x 17.5 x 14 mm. The posterior lesion now measures 9.5 x 13 x 20 mm.  Vasogenic edema of the frontal and parietal lobes   06/21/2019 - 06/27/2019 Radiation Therapy   SRS to the brain   07/19/2020 Relapse/Recurrence   Left mastectomy scar nodule: Biopsy invasive carcinoma ER 60%, PR 0%, HER-2 negative by [facility]   07/29/2020 Surgery   Soft tissue mass left chest wall excision: No evidence of malignancy.   Metastatic breast cancer ([place])  06/29/2017 Initial Diagnosis   Metastatic breast cancer ([place])  06/15/2018 -  Chemotherapy   The patient had trastuzumab (HERCEPTIN) 546 mg in sodium chloride 0.9 % 250 mL chemo infusion, 8 mg/kg = 546 mg, Intravenous,  Once, 13 of 13 cycles Administration: 546 mg (06/15/2018), 399 mg  (09/07/2018), 399 mg (07/13/2018), 399 mg (10/12/2018), 399 mg (11/02/2018), 399 mg (11/30/2018), 399 mg (01/24/2019), 399 mg (12/28/2018), 399 mg (02/22/2019), 399 mg (03/22/2019), 399 mg (04/17/2019), 399 mg (08/10/2018), 399 mg (05/17/2019) trastuzumab-dkst (OGIVRI) 399 mg in sodium chloride 0.9 % 250 mL chemo infusion, 6 mg/kg = 399 mg (100 % of original dose 6 mg/kg), Intravenous,  Once, 15 of 17 cycles Dose modification: 6 mg/kg (original dose 6 mg/kg, Cycle 14, Reason: Other (see comments), Comment: Biosimilar Conversion) Administration: 399 mg (06/14/2019), 399 mg (07/12/2019), 399 mg (08/09/2019), 399 mg (09/06/2019), 399 mg (10/04/2019), 399 mg (11/01/2019), 399 mg (11/29/2019), 399 mg (12/27/2019), 399 mg (01/24/2020), 399 mg (02/21/2020), 399 mg (03/20/2020), 399 mg (04/17/2020), 399 mg (05/15/2020), 399 mg (06/12/2020), 399 mg (07/10/2020)  for chemotherapy treatment.      CHIEF COMPLIANT: Follow-up on Herceptin with abemaciclib,letrozole,andXgeva  INTERVAL HISTORY: [name] Riley is a 69 y.o. with above-mentioned history of metastatic breast cancer currently on abemaciclib with letrozole along with HerceptinandXgeva.Biopsy of the left mastectomy scar on 07/14/20 showed invasive carcinoma, HER-2 negative, ER+ 60%, PR- 0%. She underwent an excision with Dr. [name] on 07/29/20 for which pathology showed no evidence of malignancy, and a cyst. She presents to theclinictoday for treatment.   She denies any pain or any other symptoms related to her metastatic breast cancer.  ALLERGIES:  is allergic to cantaloupe extract allergy skin test, contrast media [iodinated diagnostic agents], pravastatin, zosyn [piperacillin sod-tazobactam so], and latex.  MEDICATIONS:  Current Outpatient Medications  Medication Sig Dispense Refill  . abemaciclib (VERZENIO) 100 MG tablet Take 1 tablet (100 mg total) by mouth 2 (two) times daily. Swallow tablets whole. Do not chew, crush, or split tablets before swallowing. 56 tablet 3  .  acetaminophen (TYLENOL) 500 MG tablet Take 1,000 mg by mouth every 6 (six) hours as needed for moderate pain.    . Ascorbic Acid (VITAMIN C PO) Take by mouth.    . cholecalciferol (VITAMIN D) 1000 units tablet Take 1 tablet (1,000 Units total) by mouth daily.    Marland Kitchen letrozole (FEMARA) 2.5 MG tablet TAKE ONE (1) TABLET EACH DAY 90 tablet 3  . levETIRAcetam (KEPPRA) 250 MG tablet Take 1 tablet (250 mg total) by mouth 2 (two) times daily.    Marland Kitchen levothyroxine (SYNTHROID) 100 MCG tablet TAKE ONE TABLET EACH MORNING BEFORE BREAKFAST 90 tablet 3  . lidocaine-prilocaine (EMLA) cream APPLY TOPICALLY AS NEEDED FOR PORT ACCESS 30 g 3  . metoprolol succinate (TOPROL-XL) 100 MG 24 hr tablet TAKE ONE (1) TABLET EACH DAY 90 tablet 0  . Multiple Vitamins-Minerals (MULTIVITAMIN WITH MINERALS) tablet Take 1 tablet by mouth daily.      . ondansetron (ZOFRAN-ODT) 4 MG disintegrating tablet TAKE 1 TABLET EVERY 6 HOURS AS NEEDED FOR NAUSEA AND VOMITING 30 tablet 3  . rosuvastatin (CRESTOR) 5 MG tablet Take 1 tablet by mouth 2 (two) times daily.     Marland Kitchen triamcinolone (KENALOG) 0.025 % ointment Apply 1 application topically 2 (two) times daily. 80 g 1  . valsartan (DIOVAN) 80 MG tablet TAKE ONE (1) TABLET EACH DAY 90 tablet 0   No current facility-administered medications for this visit.    PHYSICAL EXAMINATION: ECOG PERFORMANCE STATUS: 1 - Symptomatic but completely ambulatory  Vitals:  08/07/20 0840  BP: (!) 160/86  Pulse: 77  Resp: 18  Temp: (!) 97.1 F (36.2 C)  SpO2: 100%   Filed Weights   08/07/20 0840  Weight: 141 lb 6.4 oz (64.1 kg)    LABORATORY DATA:  I have reviewed the data as listed CMP Latest Ref Rng & Units 07/10/2020 06/12/2020 05/15/2020  Glucose 70 - 99 mg/dL 99 94 117(H)  BUN 8 - 23 mg/dL 16 16 16   Creatinine 0.44 - 1.00 mg/dL 0.82 0.95 0.90  Sodium 135 - 145 mmol/L 139 138 141  Potassium 3.5 - 5.1 mmol/L 3.6 3.7 3.5  Chloride 98 - 111 mmol/L 105 102 105  CO2 22 - 32 mmol/L 30 29 27     Calcium 8.9 - 10.3 mg/dL 9.4 10.7(H) 10.4(H)  Total Protein 6.5 - 8.1 g/dL 6.9 6.8 6.7  Total Bilirubin 0.3 - 1.2 mg/dL 0.6 0.5 0.6  Alkaline Phos 38 - 126 U/L 181(H) 199(H) 229(H)  AST 15 - 41 U/L 41 53(H) 56(H)  ALT 0 - 44 U/L 27 34 43    Lab Results  Component Value Date   WBC 2.9 (L) 08/07/2020   HGB 9.9 (L) 08/07/2020   HCT 29.8 (L) 08/07/2020   MCV 106.8 (H) 08/07/2020   PLT 116 (L) 08/07/2020   NEUTROABS 1.2 (L) 08/07/2020    ASSESSMENT & PLAN:  Breast cancer of upper-outer quadrant of left female breast ([place]) Current treatment: Abemaciclibwith letrozole along with Herceptin(to be given every 4 weeks)and Xgeva started 06/15/2018  Toxicities:Cytopenias Thrombocytopenia: Monitoring closely,platelet count116 Anemia: Today's hemoglobin is9.9 Leukopenia: Today's ANC1.2 Diarrhea is under good control with a lower dosage of abemaciclib.  CT CAP 05/25/2020: No significant interval change in the bone metastases. 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  I encouraged her to continue abemaciclib and Herceptin with Xgeva..  Return to clinic monthly for Herceptin and every other month with labs for follow-up to see me     No orders of the defined types were placed in this encounter.  The patient has a good understanding of the overall plan. she agrees with it. she will call with any problems that may develop before the next visit here.  Total time spent: 30 mins including face to face time and time spent for planning, charting and coordination of care  Nicholas Lose, MD 08/07/2020  I, Cloyde Reams Dorshimer, am acting as scribe for Dr. Nicholas Lose.  I have reviewed the above documentation for accuracy and completeness, and I agree with the above.

## 2020-08-07 ENCOUNTER — Inpatient Hospital Stay: Payer: Medicare Other

## 2020-08-07 ENCOUNTER — Inpatient Hospital Stay: Payer: Medicare Other | Attending: Hematology and Oncology | Admitting: Hematology and Oncology

## 2020-08-07 ENCOUNTER — Other Ambulatory Visit: Payer: Self-pay

## 2020-08-07 DIAGNOSIS — Z5111 Encounter for antineoplastic chemotherapy: Secondary | ICD-10-CM | POA: Diagnosis present

## 2020-08-07 DIAGNOSIS — Z170 Estrogen receptor positive status [ER+]: Secondary | ICD-10-CM

## 2020-08-07 DIAGNOSIS — C50919 Malignant neoplasm of unspecified site of unspecified female breast: Secondary | ICD-10-CM | POA: Diagnosis not present

## 2020-08-07 DIAGNOSIS — Z923 Personal history of irradiation: Secondary | ICD-10-CM | POA: Insufficient documentation

## 2020-08-07 DIAGNOSIS — Z79899 Other long term (current) drug therapy: Secondary | ICD-10-CM | POA: Diagnosis not present

## 2020-08-07 DIAGNOSIS — C792 Secondary malignant neoplasm of skin: Secondary | ICD-10-CM | POA: Diagnosis not present

## 2020-08-07 DIAGNOSIS — C50412 Malignant neoplasm of upper-outer quadrant of left female breast: Secondary | ICD-10-CM

## 2020-08-07 DIAGNOSIS — C7981 Secondary malignant neoplasm of breast: Secondary | ICD-10-CM | POA: Diagnosis not present

## 2020-08-07 DIAGNOSIS — D649 Anemia, unspecified: Secondary | ICD-10-CM | POA: Insufficient documentation

## 2020-08-07 DIAGNOSIS — R197 Diarrhea, unspecified: Secondary | ICD-10-CM | POA: Diagnosis not present

## 2020-08-07 DIAGNOSIS — D72819 Decreased white blood cell count, unspecified: Secondary | ICD-10-CM | POA: Insufficient documentation

## 2020-08-07 DIAGNOSIS — Z79811 Long term (current) use of aromatase inhibitors: Secondary | ICD-10-CM | POA: Insufficient documentation

## 2020-08-07 DIAGNOSIS — Z9012 Acquired absence of left breast and nipple: Secondary | ICD-10-CM | POA: Insufficient documentation

## 2020-08-07 DIAGNOSIS — D6959 Other secondary thrombocytopenia: Secondary | ICD-10-CM | POA: Diagnosis not present

## 2020-08-07 DIAGNOSIS — Z95828 Presence of other vascular implants and grafts: Secondary | ICD-10-CM

## 2020-08-07 DIAGNOSIS — C7951 Secondary malignant neoplasm of bone: Secondary | ICD-10-CM

## 2020-08-07 DIAGNOSIS — C7931 Secondary malignant neoplasm of brain: Secondary | ICD-10-CM | POA: Diagnosis not present

## 2020-08-07 LAB — CBC WITH DIFFERENTIAL (CANCER CENTER ONLY)
Abs Immature Granulocytes: 0.01 10*3/uL (ref 0.00–0.07)
Basophils Absolute: 0.1 10*3/uL (ref 0.0–0.1)
Basophils Relative: 2 %
Eosinophils Absolute: 0.1 10*3/uL (ref 0.0–0.5)
Eosinophils Relative: 2 %
HCT: 29.8 % — ABNORMAL LOW (ref 36.0–46.0)
Hemoglobin: 9.9 g/dL — ABNORMAL LOW (ref 12.0–15.0)
Immature Granulocytes: 0 %
Lymphocytes Relative: 43 %
Lymphs Abs: 1.2 10*3/uL (ref 0.7–4.0)
MCH: 35.5 pg — ABNORMAL HIGH (ref 26.0–34.0)
MCHC: 33.2 g/dL (ref 30.0–36.0)
MCV: 106.8 fL — ABNORMAL HIGH (ref 80.0–100.0)
Monocytes Absolute: 0.3 10*3/uL (ref 0.1–1.0)
Monocytes Relative: 12 %
Neutro Abs: 1.2 10*3/uL — ABNORMAL LOW (ref 1.7–7.7)
Neutrophils Relative %: 41 %
Platelet Count: 116 10*3/uL — ABNORMAL LOW (ref 150–400)
RBC: 2.79 MIL/uL — ABNORMAL LOW (ref 3.87–5.11)
RDW: 13.2 % (ref 11.5–15.5)
WBC Count: 2.9 10*3/uL — ABNORMAL LOW (ref 4.0–10.5)
nRBC: 0 % (ref 0.0–0.2)

## 2020-08-07 LAB — CMP (CANCER CENTER ONLY)
ALT: 32 U/L (ref 0–44)
AST: 46 U/L — ABNORMAL HIGH (ref 15–41)
Albumin: 3.6 g/dL (ref 3.5–5.0)
Alkaline Phosphatase: 196 U/L — ABNORMAL HIGH (ref 38–126)
Anion gap: 6 (ref 5–15)
BUN: 20 mg/dL (ref 8–23)
CO2: 29 mmol/L (ref 22–32)
Calcium: 9.8 mg/dL (ref 8.9–10.3)
Chloride: 104 mmol/L (ref 98–111)
Creatinine: 0.86 mg/dL (ref 0.44–1.00)
GFR, Estimated: >60 mL/min (ref >60)
Glucose, Bld: 89 mg/dL (ref 70–99)
Potassium: 3.7 mmol/L (ref 3.5–5.1)
Sodium: 139 mmol/L (ref 135–145)
Total Bilirubin: 0.5 mg/dL (ref 0.3–1.2)
Total Protein: 6.7 g/dL (ref 6.5–8.1)

## 2020-08-07 MED ORDER — DIPHENHYDRAMINE HCL 25 MG PO CAPS
50.0000 mg | ORAL_CAPSULE | Freq: Once | ORAL | Status: AC
  Administered 2020-08-07: 50 mg via ORAL

## 2020-08-07 MED ORDER — TRIAMCINOLONE ACETONIDE 0.025 % EX OINT: 1.0000 "application " | TOPICAL_OINTMENT | Freq: Two times a day (BID) | CUTANEOUS | 1 refills | Status: DC

## 2020-08-07 MED ORDER — TRASTUZUMAB-DKST CHEMO 150 MG IV SOLR
6.0000 mg/kg | Freq: Once | INTRAVENOUS | Status: AC
  Administered 2020-08-07: 399 mg via INTRAVENOUS
  Filled 2020-08-07: qty 19

## 2020-08-07 MED ORDER — DIPHENHYDRAMINE HCL 25 MG PO CAPS
ORAL_CAPSULE | ORAL | Status: AC
  Filled 2020-08-07: qty 2

## 2020-08-07 MED ORDER — SODIUM CHLORIDE 0.9% FLUSH
10.0000 mL | INTRAVENOUS | Status: DC | PRN
  Administered 2020-08-07: 10 mL
  Filled 2020-08-07: qty 10

## 2020-08-07 MED ORDER — ACETAMINOPHEN 325 MG PO TABS
650.0000 mg | ORAL_TABLET | Freq: Once | ORAL | Status: AC
  Administered 2020-08-07: 650 mg via ORAL

## 2020-08-07 MED ORDER — HEPARIN SOD (PORK) LOCK FLUSH 100 UNIT/ML IV SOLN
500.0000 [IU] | Freq: Once | INTRAVENOUS | Status: AC | PRN
  Administered 2020-08-07: 500 [IU]
  Filled 2020-08-07: qty 5

## 2020-08-07 MED ORDER — SODIUM CHLORIDE 0.9% FLUSH
10.0000 mL | Freq: Once | INTRAVENOUS | Status: AC
  Administered 2020-08-07: 10 mL
  Filled 2020-08-07: qty 10

## 2020-08-07 MED ORDER — SODIUM CHLORIDE 0.9 % IV SOLN
Freq: Once | INTRAVENOUS | Status: AC
  Filled 2020-08-07: qty 250

## 2020-08-07 MED ORDER — ACETAMINOPHEN 325 MG PO TABS
ORAL_TABLET | ORAL | Status: AC
  Filled 2020-08-07: qty 2

## 2020-08-07 NOTE — Patient Instructions (Signed)
[facility] Discharge Instructions for Patients Receiving Chemotherapy  Today you received the following chemotherapy agents trastuzumab.  To help prevent nausea and vomiting after your treatment, we encourage you to take your nausea medication as directed.    If you develop nausea and vomiting that is not controlled by your nausea medication, call the clinic.   BELOW ARE SYMPTOMS THAT SHOULD BE REPORTED IMMEDIATELY:  *FEVER GREATER THAN 100.5 F  *CHILLS WITH OR WITHOUT FEVER  NAUSEA AND VOMITING THAT IS NOT CONTROLLED WITH YOUR NAUSEA MEDICATION  *UNUSUAL SHORTNESS OF BREATH  *UNUSUAL BRUISING OR BLEEDING  TENDERNESS IN MOUTH AND THROAT WITH OR WITHOUT PRESENCE OF ULCERS  *URINARY PROBLEMS  *BOWEL PROBLEMS  UNUSUAL RASH Items with * indicate a potential emergency and should be followed up as soon as possible.  Feel free to call the clinic should you have any questions or concerns. The clinic phone number is [phone].  Please show the CHEMO ALERT CARD at check-in to the Emergency Department and triage nurse.   

## 2020-08-07 NOTE — Assessment & Plan Note (Signed)
Current treatment: Abemaciclibwith letrozole along with Herceptin(to be given every 4 weeks)and Xgeva started 06/15/2018  Toxicities:Cytopenias Thrombocytopenia: Monitoring closely,platelet count88 Anemia: Today's hemoglobin is10.1 Leukopenia: Today's ANC1.1 Diarrhea is under good control with a lower dosage of abemaciclib.  CT CAP 05/25/2020: No significant interval change in the bone metastases. 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  I encouraged her to continue abemaciclib and Herceptin with Xgeva..  Return to clinic monthly for Herceptin and every other month with labs for follow-up to see me

## 2020-08-10 ENCOUNTER — Other Ambulatory Visit: Payer: Self-pay | Admitting: *Deleted

## 2020-08-10 DIAGNOSIS — C7931 Secondary malignant neoplasm of brain: Secondary | ICD-10-CM

## 2020-08-11 ENCOUNTER — Other Ambulatory Visit: Payer: Self-pay | Admitting: Family Medicine

## 2020-08-11 DIAGNOSIS — E039 Hypothyroidism, unspecified: Secondary | ICD-10-CM

## 2020-08-12 ENCOUNTER — Telehealth: Payer: Self-pay | Admitting: Internal Medicine

## 2020-08-12 MED FILL — VERZENIO 100 MG TAB: 100 | 28 days supply | Qty: 56 | Fill #1

## 2020-08-12 NOTE — Telephone Encounter (Signed)
R/s appt on 11/19 to 11/18. Provider unavailable on 11/19. Unable to reach pt. Left voicemail with appt time and date.

## 2020-08-17 ENCOUNTER — Emergency Department (HOSPITAL_COMMUNITY)
Admission: EM | Admit: 2020-08-17 | Discharge: 2020-08-17 | Disposition: A | Discharge status: Home / Self Care | Payer: Medicare Other | Attending: Emergency Medicine | Admitting: Emergency Medicine

## 2020-08-17 ENCOUNTER — Encounter (HOSPITAL_COMMUNITY): Payer: Self-pay | Admitting: Emergency Medicine

## 2020-08-17 DIAGNOSIS — R197 Diarrhea, unspecified: Secondary | ICD-10-CM | POA: Diagnosis not present

## 2020-08-17 DIAGNOSIS — N61 Mastitis without abscess: Secondary | ICD-10-CM | POA: Insufficient documentation

## 2020-08-17 DIAGNOSIS — L0390 Cellulitis, unspecified: Secondary | ICD-10-CM

## 2020-08-17 LAB — COMPREHENSIVE METABOLIC PANEL
ALT: 25 U/L (ref 0–44)
AST: 60 U/L — ABNORMAL HIGH (ref 15–41)
Albumin: 3.7 g/dL (ref 3.5–5.0)
Alkaline Phosphatase: 151 U/L — ABNORMAL HIGH (ref 38–126)
Anion gap: 11 (ref 5–15)
BUN: 15 mg/dL (ref 8–23)
CO2: 22 mmol/L (ref 22–32)
Calcium: 8.8 mg/dL — ABNORMAL LOW (ref 8.9–10.3)
Chloride: 101 mmol/L (ref 98–111)
Creatinine, Ser: 0.88 mg/dL (ref 0.44–1.00)
GFR, Estimated: >60 mL/min (ref >60)
Glucose, Bld: 105 mg/dL — ABNORMAL HIGH (ref 70–99)
Potassium: 5 mmol/L (ref 3.5–5.1)
Sodium: 134 mmol/L — ABNORMAL LOW (ref 135–145)
Total Bilirubin: 1.6 mg/dL — ABNORMAL HIGH (ref 0.3–1.2)
Total Protein: 7.3 g/dL (ref 6.5–8.1)

## 2020-08-17 LAB — CBC
HCT: 32.1 % — ABNORMAL LOW (ref 36.0–46.0)
Hemoglobin: 10.7 g/dL — ABNORMAL LOW (ref 12.0–15.0)
MCH: 36.1 pg — ABNORMAL HIGH (ref 26.0–34.0)
MCHC: 33.3 g/dL (ref 30.0–36.0)
MCV: 108.4 fL — ABNORMAL HIGH (ref 80.0–100.0)
Platelets: 101 10*3/uL — ABNORMAL LOW (ref 150–400)
RBC: 2.96 MIL/uL — ABNORMAL LOW (ref 3.87–5.11)
RDW: 13.3 % (ref 11.5–15.5)
WBC: 3.2 10*3/uL — ABNORMAL LOW (ref 4.0–10.5)
nRBC: 0 % (ref 0.0–0.2)

## 2020-08-17 LAB — LACTIC ACID, PLASMA: Lactic Acid, Venous: 0.7 mmol/L (ref 0.5–1.9)

## 2020-08-17 LAB — LIPASE, BLOOD: Lipase: 22 U/L (ref 11–51)

## 2020-08-17 MED ORDER — SODIUM CHLORIDE 0.9 % IV BOLUS (SEPSIS)
1000.0000 mL | Freq: Once | INTRAVENOUS | Status: AC
  Administered 2020-08-17: 1000 mL via INTRAVENOUS

## 2020-08-17 MED ORDER — DOXYCYCLINE HYCLATE 100 MG PO TABS
100.0000 mg | ORAL_TABLET | Freq: Once | ORAL | Status: AC
  Administered 2020-08-17: 100 mg via ORAL
  Filled 2020-08-17: qty 1

## 2020-08-17 MED ORDER — SODIUM CHLORIDE 0.9 % IV SOLN
1000.0000 mL | INTRAVENOUS | Status: DC
  Administered 2020-08-17: 1000 mL via INTRAVENOUS

## 2020-08-17 MED ORDER — DOXYCYCLINE HYCLATE 100 MG PO TABS: 100.0000 mg | ORAL_TABLET | Freq: Two times a day (BID) | ORAL | 0 refills | Status: DC

## 2020-08-17 NOTE — ED Triage Notes (Addendum)
Pt had a double mastectomy on 07/29/20. She reports redness, pruritis and some discharge on her L chest. She also reports that diarrhea woke her up around 4a which prompted her to come in. She endorses some lower abdominal pain. A&Ox4. Ambulatory. She has an appointment with her surgeon today at [place].

## 2020-08-17 NOTE — Discharge Instructions (Signed)
Take the antibiotics as prescribed.  Follow-up with Dr. [name] and Lindi Adie.  Return to the ED as needed for worsening symptoms

## 2020-08-17 NOTE — ED Provider Notes (Signed)
[facility] DEPT Provider Note   CSN: [id number] Arrival date & time: 08/17/20  0544     History Chief Complaint  Patient presents with  . Diarrhea    [name] Riley is a 69 y.o. female.  HPI   Pt started to notice redness over the weekend on her left breast.  She also noticed some redness spreading towards the arm.  Early this am she started with diarrhea.  She felt hot but she did not take her temperature.    No abdominal pain or vomiting but she did have a mild stomach ache that got better after having a bowel movement.  She has taken some imodium for the diarrhea.  She had 5 episodes.  She has not been on any recent abx,  Did see dr tseui on oct 13.  Opened wound.  Last chemo oct 22nd  Past Medical History:  Diagnosis Date  . Allergy   . Breast cancer ([place]) 08/25/2011   L , invasive ductal carcinoma, ER/PR +,HER2 -  . Cancer [facility])    left breast cancer  . Coronary artery disease 2001  . Heart attack [facility]) 09/2000   Sep 25, 2000  --no intervention  . History of chemotherapy comp. 08/22/2012   4 cycles of FEC and $ cycles of Taxotere  . Hyperlipidemia   . Hypertension   . Hypothyroidism   . PONV (postoperative nausea and vomiting)    gets sick from anesthesia  . Status post radiation therapy 07/09/12 - 08/22/2012   Left Breast, 60.4 gray    Patient Active Problem List   Diagnosis Date Noted  . Brain metastasis ([place]) 07/13/2018  . Goals of care, counseling/discussion 06/05/2018  . UTI (urinary tract infection) 05/31/2018  . Sepsis secondary to UTI ([place]) 05/31/2018  . Thrombocytopenia ([place]) 05/31/2018  . Hypokalemia 05/31/2018  . Port-A-Cath in place 03/23/2018  . Elevated LFTs   . Metastatic breast cancer ([place]) 06/29/2017  . Nodule of skin of breast 03/06/2017  . Abscess, abdomen 07/06/2016  . Metastasis to bone ([place]) 06/01/2016  . Pneumothorax on left 04/25/2016  . Chest pain 10/23/2014  . Radiation-induced dermatitis 07/27/2012    . Hypertension   . Breast cancer of upper-outer quadrant of left female breast ([place]) 09/01/2011  . Hypothyroidism 06/12/2009  . Mixed hyperlipidemia 06/12/2009  . Essential hypertension 06/12/2009  . Coronary atherosclerosis 06/12/2009  . DIZZINESS 06/12/2009  . PALPITATIONS 06/12/2009    Past Surgical History:  Procedure Laterality Date  . ABDOMINAL HYSTERECTOMY  1998   TAH, oophorectomy  . APPENDECTOMY  1970  . BREAST CYST EXCISION Left 07/29/2020   Procedure: WIDE EXCISION OF LEFT MASTECTOMY INCISION;  Surgeon: Donnie Mesa, MD;  Location: [facility];  Service: General;  Laterality: Left;  . BREAST SURGERY  1998   removal of benign lump in rt breast  . BREAST SURGERY  11/14/11   right simple mastectomy, left mrm  . INCISION AND DRAINAGE OF WOUND Left 07/01/2017   Procedure: IRRIGATION AND DEBRIDEMENT CHEST WALL ABSCESS;  Surgeon: Donnie Mesa, MD;  Location: [facility];  Service: General;  Laterality: Left;  Marland Kitchen MASS EXCISION Left 06/22/2017   Procedure: EXCISION OF CHEST WALL MASSES;  Surgeon: Donnie Mesa, MD;  Location: [facility];  Service: General;  Laterality: Left;  Marland Kitchen MASTECTOMY Bilateral    for left breast cancer  . OVARIAN CYST SURGERY Right 1970  . PORT-A-CATH REMOVAL  08/30/2012   Procedure: REMOVAL PORT-A-CATH;  Surgeon: Imogene Burn. Tsuei, MD;  Location: [facility];  Service: General;  Laterality: Right;  port removal  . PORTACATH PLACEMENT  11/14/2011   Procedure: INSERTION PORT-A-CATH;  Surgeon: Imogene Burn. [name], MD;  Location: [facility];  Service: General;  Laterality: Right;  . PORTACATH PLACEMENT N/A 04/25/2016   Procedure: INSERTION PORT-A-CATH LEFT CHEST;  Surgeon: Donnie Mesa, MD;  Location: [facility];  Service: General;  Laterality: N/A;  . skin tags  05/09/1997   left axillary left neck skin tags  . TONSILLECTOMY  1968     OB History    Gravida  4   Para      Term      Preterm      AB  1   Living  3     SAB   1   TAB      Ectopic      Multiple      Live Births              Family History  Problem Relation Age of Onset  . Hypertension Maternal Grandmother   . Diabetes Maternal Grandmother   . Cancer Father 38       lung cancer and Prostate Cancer  . Hypertension Mother   . Cancer Paternal Aunt        ovarian  . Cancer Cousin        breast, paternal cousin  . Cancer Paternal Uncle        stomach  . Cancer Paternal Grandfather        Esophagus  . Colon cancer Neg Hx     Social History   Tobacco Use  . Smoking status: Never Smoker  . Smokeless tobacco: Never Used  Vaping Use  . Vaping Use: Never used  Substance Use Topics  . Alcohol use: No  . Drug use: No    Home Medications Prior to Admission medications   Medication Sig Start Date End Date Taking? Authorizing Provider  abemaciclib (VERZENIO) 100 MG tablet Take 1 tablet (100 mg total) by mouth 2 (two) times daily. Swallow tablets whole. Do not chew, crush, or split tablets before swallowing. 06/25/20   Nicholas Lose, MD  acetaminophen (TYLENOL) 500 MG tablet Take 1,000 mg by mouth every 6 (six) hours as needed for moderate pain.    [provider]  Ascorbic Acid (VITAMIN C PO) Take by mouth.    [provider]  cholecalciferol (VITAMIN D) 1000 units tablet Take 1 tablet (1,000 Units total) by mouth daily. 04/13/18   Nicholas Lose, MD  doxycycline (VIBRA-TABS) 100 MG tablet Take 1 tablet (100 mg total) by mouth 2 (two) times daily. 08/17/20   Dorie Rank, MD  letrozole [facility]) 2.5 MG tablet TAKE ONE (1) TABLET EACH DAY 05/04/20   Nicholas Lose, MD  levETIRAcetam (KEPPRA) 250 MG tablet Take 1 tablet (250 mg total) by mouth 2 (two) times daily. 07/10/20   Nicholas Lose, MD  levothyroxine (SYNTHROID) 100 MCG tablet TAKE ONE TABLET EACH MORNING BEFORE BREAKFAST 08/11/20   [name], MD  lidocaine-prilocaine (EMLA) cream APPLY TOPICALLY AS NEEDED FOR PORT ACCESS 05/27/19   Nicholas Lose, MD  metoprolol  succinate (TOPROL-XL) 100 MG 24 hr tablet TAKE ONE (1) TABLET EACH DAY 07/15/20   [name], MD  Multiple Vitamins-Minerals (MULTIVITAMIN WITH MINERALS) tablet Take 1 tablet by mouth daily.      [provider]  ondansetron (ZOFRAN-ODT) 4 MG disintegrating tablet TAKE 1 TABLET EVERY 6 HOURS AS NEEDED FOR NAUSEA AND  VOMITING 11/21/19   Nicholas Lose, MD  rosuvastatin (CRESTOR) 5 MG tablet Take 1 tablet by mouth 2 (two) times daily.  04/10/20   [provider]  triamcinolone (KENALOG) 0.025 % ointment Apply 1 application topically 2 (two) times daily. 08/07/20   Nicholas Lose, MD  valsartan (DIOVAN) 80 MG tablet TAKE ONE (1) TABLET EACH DAY 05/19/20   [name], MD    Allergies    Cantaloupe extract allergy skin test, Contrast media [iodinated diagnostic agents], Pravastatin, Zosyn [piperacillin sod-tazobactam so], and Latex  Review of Systems   Review of Systems  Gastrointestinal: Negative for abdominal distention.  Genitourinary: Negative for dysuria.  All other systems reviewed and are negative.   Physical Exam Updated Vital Signs BP 139/80   Pulse 90   Temp 99 F (37.2 C) (Oral)   Resp 16   SpO2 98%   Physical Exam Vitals and nursing note reviewed.  Constitutional:      General: She is not in acute distress.    Appearance: She is well-developed.  HENT:     Head: Normocephalic and atraumatic.     Right Ear: External ear normal.     Left Ear: External ear normal.  Eyes:     General: No scleral icterus.       Right eye: No discharge.        Left eye: No discharge.     Conjunctiva/sclera: Conjunctivae normal.  Neck:     Trachea: No tracheal deviation.  Cardiovascular:     Rate and Rhythm: Normal rate and regular rhythm.  Pulmonary:     Effort: Pulmonary effort is normal. No respiratory distress.     Breath sounds: Normal breath sounds. No stridor. No wheezing or rales.  Chest:     Comments: S/p mastectomy, sutures intact, no drainage, erythema  surrounding wound, no lymphangitic streaking, increased warmth to skin around wound Abdominal:     General: Bowel sounds are normal. There is no distension.     Palpations: Abdomen is soft.     Tenderness: There is no abdominal tenderness. There is no guarding or rebound.  Musculoskeletal:        General: No tenderness.     Cervical back: Neck supple.  Skin:    General: Skin is warm and dry.     Findings: No rash.  Neurological:     Mental Status: She is alert.     Cranial Nerves: No cranial nerve deficit (no facial droop, extraocular movements intact, no slurred speech).     Sensory: No sensory deficit.     Motor: No abnormal muscle tone or seizure activity.     Coordination: Coordination normal.     ED Results / Procedures / Treatments   Labs (all labs ordered are listed, but only abnormal results are displayed) Labs Reviewed  COMPREHENSIVE METABOLIC PANEL - Abnormal; Notable for the following components:      Result Value   Sodium 134 (*)    Glucose, Bld 105 (*)    Calcium 8.8 (*)    AST 60 (*)    Alkaline Phosphatase 151 (*)    Total Bilirubin 1.6 (*)    All other components within normal limits  CBC - Abnormal; Notable for the following components:   WBC 3.2 (*)    RBC 2.96 (*)    Hemoglobin 10.7 (*)    HCT 32.1 (*)    MCV 108.4 (*)    MCH 36.1 (*)    Platelets 101 (*)  All other components within normal limits  LIPASE, BLOOD  LACTIC ACID, PLASMA    EKG None  Radiology No results found.  Procedures Procedures (including critical care time)  Medications Ordered in ED Medications  sodium chloride 0.9 % bolus 1,000 mL (1,000 mLs Intravenous New Bag/Given 08/17/20 1155)    Followed by  0.9 %  sodium chloride infusion (1,000 mLs Intravenous New Bag/Given 08/17/20 0814)  doxycycline (VIBRA-TABS) tablet 100 mg (has no administration in time range)    ED Course  I have reviewed the triage vital signs and the nursing notes.  Pertinent labs & imaging  results that were available during my care of the patient were reviewed by me and considered in my medical decision making (see chart for details).  Clinical Course as of Aug 17 950  [facility] Aug 17, 2020  2080 Labs reviewed.  No acute findings metabolic panel   [JK]  2233 CBC shows decreased white blood cell count as well as anemia and mild thrombocytopenia.  These are similar to previous values   [JK]    Clinical Course User Index [JK] Dorie Rank, MD   MDM Rules/Calculators/A&P                          Patient presented to ED with complaints of some redness around her surgical wound site around her left breast.  Patient also presented with diarrhea that started last evening.  ED work-up is reassuring.  Patient does not appear to have any signs of sepsis or systemic infection.  She does have mild redness around the left breast surgical site.  There could be an early cellulitis versus some inflammation associated with the sutures.  Having is reasonable to start her on a course of antibiotics and will have her follow-up with her surgeon closely.  Patient is not having any abdominal pain I suspect her diarrhea could be related to her chemotherapy treatments.  Patient does show some abnormalities in her CBC but this is similar to previous values.  No neutropenia.  Patient has been taking Imodium.  At this time she appears stable for discharge and close outpatient follow-up. Final Clinical Impression(s) / ED Diagnoses Final diagnoses:  Diarrhea, unspecified type  Cellulitis, unspecified cellulitis site    Rx / DC Orders ED Discharge Orders         Ordered    doxycycline (VIBRA-TABS) 100 MG tablet  2 times daily        08/17/20 0950           Dorie Rank, MD 08/17/20 [phone]

## 2020-08-17 NOTE — ED Notes (Signed)
Attempted to call husband, daughter and son with no answer from anyone. Will continue to try to contact family to pick up patient.

## 2020-08-18 ENCOUNTER — Other Ambulatory Visit: Payer: Self-pay | Admitting: Radiation Therapy

## 2020-08-24 ENCOUNTER — Ambulatory Visit (HOSPITAL_COMMUNITY)
Admission: RE | Admit: 2020-08-24 | Discharge: 2020-08-24 | Disposition: A | Discharge status: Home / Self Care | Payer: Medicare Other | Source: Ambulatory Visit | Attending: Internal Medicine | Admitting: Internal Medicine

## 2020-08-24 ENCOUNTER — Other Ambulatory Visit: Payer: Self-pay

## 2020-08-24 DIAGNOSIS — C7931 Secondary malignant neoplasm of brain: Secondary | ICD-10-CM | POA: Insufficient documentation

## 2020-08-24 MED ORDER — GADOBUTROL 1 MMOL/ML IV SOLN
6.5000 mL | Freq: Once | INTRAVENOUS | Status: AC | PRN
  Administered 2020-08-24: 6.5 mL via INTRAVENOUS

## 2020-08-24 MED ORDER — HEPARIN SOD (PORK) LOCK FLUSH 100 UNIT/ML IV SOLN
500.0000 [IU] | INTRAVENOUS | Status: AC | PRN
  Administered 2020-08-24: 500 [IU]
  Filled 2020-08-24: qty 5

## 2020-08-31 ENCOUNTER — Inpatient Hospital Stay: Payer: Medicare Other | Attending: Hematology and Oncology

## 2020-08-31 DIAGNOSIS — C50412 Malignant neoplasm of upper-outer quadrant of left female breast: Secondary | ICD-10-CM | POA: Insufficient documentation

## 2020-08-31 DIAGNOSIS — Z5111 Encounter for antineoplastic chemotherapy: Secondary | ICD-10-CM | POA: Insufficient documentation

## 2020-08-31 DIAGNOSIS — Z8041 Family history of malignant neoplasm of ovary: Secondary | ICD-10-CM | POA: Insufficient documentation

## 2020-08-31 DIAGNOSIS — Z79811 Long term (current) use of aromatase inhibitors: Secondary | ICD-10-CM | POA: Insufficient documentation

## 2020-08-31 DIAGNOSIS — Z170 Estrogen receptor positive status [ER+]: Secondary | ICD-10-CM | POA: Insufficient documentation

## 2020-08-31 DIAGNOSIS — Z923 Personal history of irradiation: Secondary | ICD-10-CM | POA: Insufficient documentation

## 2020-08-31 DIAGNOSIS — C7931 Secondary malignant neoplasm of brain: Secondary | ICD-10-CM | POA: Insufficient documentation

## 2020-08-31 DIAGNOSIS — Z801 Family history of malignant neoplasm of trachea, bronchus and lung: Secondary | ICD-10-CM | POA: Insufficient documentation

## 2020-09-01 ENCOUNTER — Encounter: Payer: Self-pay | Admitting: Family Medicine

## 2020-09-01 ENCOUNTER — Other Ambulatory Visit: Payer: Self-pay

## 2020-09-01 ENCOUNTER — Ambulatory Visit (INDEPENDENT_AMBULATORY_CARE_PROVIDER_SITE_OTHER): Payer: Medicare Other | Admitting: Family Medicine

## 2020-09-01 VITALS — BP 116/68 | HR 65 | Temp 97.9°F | Ht 68.0 in | Wt 141.4 lb

## 2020-09-01 DIAGNOSIS — E039 Hypothyroidism, unspecified: Secondary | ICD-10-CM | POA: Diagnosis not present

## 2020-09-01 DIAGNOSIS — E782 Mixed hyperlipidemia: Secondary | ICD-10-CM

## 2020-09-01 DIAGNOSIS — I10 Essential (primary) hypertension: Secondary | ICD-10-CM

## 2020-09-01 DIAGNOSIS — I25118 Atherosclerotic heart disease of native coronary artery with other forms of angina pectoris: Secondary | ICD-10-CM | POA: Diagnosis not present

## 2020-09-01 MED ORDER — VALSARTAN 80 MG PO TABS: ORAL_TABLET | ORAL | 1 refills | Status: DC

## 2020-09-01 NOTE — Progress Notes (Signed)
Subjective:  Patient ID: [name] Riley, female    DOB: Jun 12, 1951  Age: 69 y.o. MRN: [id number]  CC: Follow-up   HPI [name] presents for presents for  follow-up of hypertension. Patient has no history of headache chest pain or shortness of breath or recent cough. Patient also denies symptoms of TIA such as focal numbness or weakness. Patient denies side effects from medication. States taking it regularly.  follow-up on  thyroid. The patient has a history of hypothyroidism for many years. It has been stable recently. Pt. denies any change in  voice, loss of hair, heat or cold intolerance. Energy level has been adequate to good. Patient denies constipation and diarrhea. No myxedema. Medication is as noted below. Verified that pt is taking it daily on an empty stomach. Well tolerated.    Depression screen [facility] 2/9 09/01/2020 02/18/2020 01/23/2020  Decreased Interest 0 0 0  Down, Depressed, Hopeless 0 0 0  PHQ - 2 Score 0 0 0  Altered sleeping - - -  Tired, decreased energy - - -  Change in appetite - - -  Feeling bad or failure about yourself  - - -  Trouble concentrating - - -  Moving slowly or fidgety/restless - - -  Suicidal thoughts - - -  PHQ-9 Score - - -  Some recent data might be hidden    History [name] has a past medical history of Allergy, Breast cancer ([place]) (08/25/2011), Cancer [facility]), Coronary artery disease (2001), Heart attack ([place]) (09/2000), History of chemotherapy (comp. 08/22/2012), Hyperlipidemia, Hypertension, Hypothyroidism, PONV (postoperative nausea and vomiting), and Status post radiation therapy (07/09/12 - 08/22/2012).   She has a past surgical history that includes Tonsillectomy (1968); Ovarian cyst surgery (Right, 1970); skin tags (05/09/1997); Portacath placement (11/14/2011); Breast surgery (1998); Breast surgery (11/14/11); Abdominal hysterectomy (1998); Appendectomy (1970); Port-a-cath removal (08/30/2012); Mastectomy (Bilateral); Portacath placement (N/A,  04/25/2016); Mass excision (Left, 06/22/2017); Incision and drainage of wound (Left, 07/01/2017); and Breast cyst excision (Left, 07/29/2020).   Her family history includes Cancer in her cousin, paternal aunt, paternal grandfather, and paternal uncle; Cancer (age of onset: 7) in her father; Diabetes in her maternal grandmother; Hypertension in her maternal grandmother and mother.She reports that she has never smoked. She has never used smokeless tobacco. She reports that she does not drink alcohol and does not use drugs.    ROS Review of Systems  Constitutional: Negative.   HENT: Negative.   Eyes: Negative for visual disturbance.  Respiratory: Negative for shortness of breath.   Cardiovascular: Negative for chest pain.  Gastrointestinal: Negative for abdominal pain.  Musculoskeletal: Negative for arthralgias.    Objective:  BP 116/68   Pulse 65   Temp 97.9 F (36.6 C) (Temporal)   Ht 5' 8"  (1.727 m)   Wt 141 lb 6.4 oz (64.1 kg)   BMI 21.50 kg/m   BP Readings from Last 3 Encounters:  09/01/20 116/68  08/17/20 140/79  08/07/20 (!) 160/86    Wt Readings from Last 3 Encounters:  09/01/20 141 lb 6.4 oz (64.1 kg)  08/07/20 141 lb 6.4 oz (64.1 kg)  07/29/20 140 lb 6.9 oz (63.7 kg)     Physical Exam Constitutional:      General: She is not in acute distress.    Appearance: She is well-developed.  HENT:     Head: Normocephalic and atraumatic.  Eyes:     Conjunctiva/sclera: Conjunctivae normal.     Pupils: Pupils are equal, round, and reactive to light.  Neck:     Thyroid: No thyromegaly.  Cardiovascular:     Rate and Rhythm: Normal rate and regular rhythm.     Heart sounds: Normal heart sounds. No murmur heard.   Pulmonary:     Effort: Pulmonary effort is normal. No respiratory distress.     Breath sounds: Normal breath sounds. No wheezing or rales.  Abdominal:     General: Bowel sounds are normal. There is no distension.     Palpations: Abdomen is soft.      Tenderness: There is no abdominal tenderness.  Musculoskeletal:        General: Normal range of motion.     Cervical back: Normal range of motion and neck supple.  Lymphadenopathy:     Cervical: No cervical adenopathy.  Skin:    General: Skin is warm and dry.  Neurological:     Mental Status: She is alert and oriented to person, place, and time.  Psychiatric:        Behavior: Behavior normal.        Thought Content: Thought content normal.        Judgment: Judgment normal.       Assessment & Plan:   Demonica was seen today for follow-up.  Diagnoses and all orders for this visit:  Hypothyroidism, unspecified type -     CBC with Differential/Platelet -     CMP14+EGFR -     Lipid panel -     TSH + free T4  Mixed hyperlipidemia -     CBC with Differential/Platelet -     CMP14+EGFR -     Lipid panel -     TSH + free T4  Essential hypertension -     valsartan (DIOVAN) 80 MG tablet; TAKE ONE (1) TABLET EACH DAY -     CBC with Differential/Platelet -     CMP14+EGFR -     Lipid panel -     TSH + free T4  Atherosclerosis of native coronary artery of native heart with other form of angina pectoris (HCC) -     CBC with Differential/Platelet -     CMP14+EGFR -     Lipid panel -     TSH + free T4       I have discontinued [name]'s doxycycline. I am also having her maintain her multivitamin with minerals, acetaminophen, cholecalciferol, lidocaine-prilocaine, ondansetron, rosuvastatin, letrozole, abemaciclib, levETIRAcetam, metoprolol succinate, Ascorbic Acid (VITAMIN C PO), triamcinolone, levothyroxine, and valsartan.  Allergies as of 09/01/2020      Reactions   Cantaloupe Extract Allergy Skin Test Shortness Of Breath   Contrast Media [iodinated Diagnostic Agents] Shortness Of Breath, Rash   Pravastatin Other (See Comments)   Legs hurt   Zosyn [piperacillin Sod-tazobactam So] Rash, Other (See Comments)   Temperature increase, facial flushing   Latex Rash    Redness, itch      Medication List       Accurate as of September 01, 2020 11:59 PM. If you have any questions, ask your nurse or doctor.        STOP taking these medications   doxycycline 100 MG tablet Commonly known as: VIBRA-TABS Stopped by: [name], MD     TAKE these medications   abemaciclib 100 MG tablet Commonly known as: Verzenio Take 1 tablet (100 mg total) by mouth 2 (two) times daily. Swallow tablets whole. Do not chew, crush, or split tablets before swallowing.   acetaminophen 500 MG tablet Commonly known as: TYLENOL  Take 1,000 mg by mouth every 6 (six) hours as needed for moderate pain.   cholecalciferol 1000 units tablet Commonly known as: VITAMIN D Take 1 tablet (1,000 Units total) by mouth daily.   letrozole 2.5 MG tablet Commonly known as: FEMARA TAKE ONE (1) TABLET EACH DAY   levETIRAcetam 250 MG tablet Commonly known as: KEPPRA Take 1 tablet (250 mg total) by mouth 2 (two) times daily.   levothyroxine 100 MCG tablet Commonly known as: SYNTHROID TAKE ONE TABLET EACH MORNING BEFORE BREAKFAST   lidocaine-prilocaine cream Commonly known as: EMLA APPLY TOPICALLY AS NEEDED FOR PORT ACCESS   metoprolol succinate 100 MG 24 hr tablet Commonly known as: TOPROL-XL TAKE ONE (1) TABLET EACH DAY   multivitamin with minerals tablet Take 1 tablet by mouth daily.   ondansetron 4 MG disintegrating tablet Commonly known as: ZOFRAN-ODT TAKE 1 TABLET EVERY 6 HOURS AS NEEDED FOR NAUSEA AND VOMITING   rosuvastatin 5 MG tablet Commonly known as: CRESTOR Take 1 tablet by mouth 2 (two) times daily.   triamcinolone 0.025 % ointment Commonly known as: KENALOG Apply 1 application topically 2 (two) times daily.   valsartan 80 MG tablet Commonly known as: DIOVAN TAKE ONE (1) TABLET EACH DAY   VITAMIN C PO Take by mouth.        Follow-up: Return in about 6 months (around 03/01/2021).  [name], M.D.

## 2020-09-02 ENCOUNTER — Other Ambulatory Visit: Payer: Medicare Other

## 2020-09-03 ENCOUNTER — Other Ambulatory Visit: Payer: Self-pay | Admitting: *Deleted

## 2020-09-03 ENCOUNTER — Other Ambulatory Visit: Payer: Self-pay

## 2020-09-03 ENCOUNTER — Inpatient Hospital Stay (HOSPITAL_BASED_OUTPATIENT_CLINIC_OR_DEPARTMENT_OTHER): Payer: Medicare Other | Admitting: Internal Medicine

## 2020-09-03 VITALS — BP 151/79 | HR 85 | Temp 97.0°F | Resp 18 | Ht 68.0 in | Wt 139.9 lb

## 2020-09-03 DIAGNOSIS — Z8041 Family history of malignant neoplasm of ovary: Secondary | ICD-10-CM | POA: Diagnosis not present

## 2020-09-03 DIAGNOSIS — Z923 Personal history of irradiation: Secondary | ICD-10-CM | POA: Diagnosis not present

## 2020-09-03 DIAGNOSIS — C50412 Malignant neoplasm of upper-outer quadrant of left female breast: Secondary | ICD-10-CM

## 2020-09-03 DIAGNOSIS — C7931 Secondary malignant neoplasm of brain: Secondary | ICD-10-CM | POA: Diagnosis not present

## 2020-09-03 DIAGNOSIS — Z5111 Encounter for antineoplastic chemotherapy: Secondary | ICD-10-CM | POA: Diagnosis not present

## 2020-09-03 DIAGNOSIS — Z170 Estrogen receptor positive status [ER+]: Secondary | ICD-10-CM

## 2020-09-03 DIAGNOSIS — Z801 Family history of malignant neoplasm of trachea, bronchus and lung: Secondary | ICD-10-CM | POA: Diagnosis not present

## 2020-09-03 DIAGNOSIS — Z79811 Long term (current) use of aromatase inhibitors: Secondary | ICD-10-CM | POA: Diagnosis not present

## 2020-09-03 LAB — CMP14+EGFR
ALT: 22 IU/L (ref 0–32)
AST: 42 IU/L — ABNORMAL HIGH (ref 0–40)
Albumin/Globulin Ratio: 1.9 (ref 1.2–2.2)
Albumin: 4.1 g/dL (ref 3.8–4.8)
Alkaline Phosphatase: 193 IU/L — ABNORMAL HIGH (ref 44–121)
BUN/Creatinine Ratio: 18 (ref 12–28)
BUN: 16 mg/dL (ref 8–27)
Bilirubin Total: 0.5 mg/dL (ref 0.0–1.2)
CO2: 25 mmol/L (ref 20–29)
Calcium: 9.7 mg/dL (ref 8.7–10.3)
Chloride: 102 mmol/L (ref 96–106)
Creatinine, Ser: 0.91 mg/dL (ref 0.57–1.00)
GFR calc Af Amer: 76 mL/min/{1.73_m2} (ref >59)
GFR calc non Af Amer: 65 mL/min/{1.73_m2} (ref >59)
Globulin, Total: 2.2 g/dL (ref 1.5–4.5)
Glucose: 88 mg/dL (ref 65–99)
Potassium: 4.5 mmol/L (ref 3.5–5.2)
Sodium: 141 mmol/L (ref 134–144)
Total Protein: 6.3 g/dL (ref 6.0–8.5)

## 2020-09-03 LAB — CBC WITH DIFFERENTIAL/PLATELET
Basophils Absolute: 0.1 10*3/uL (ref 0.0–0.2)
Basos: 2 %
EOS (ABSOLUTE): 0 10*3/uL (ref 0.0–0.4)
Eos: 1 %
Hematocrit: 29.2 % — ABNORMAL LOW (ref 34.0–46.6)
Hemoglobin: 10.1 g/dL — ABNORMAL LOW (ref 11.1–15.9)
Immature Grans (Abs): 0 10*3/uL (ref 0.0–0.1)
Immature Granulocytes: 0 %
Lymphocytes Absolute: 1.4 10*3/uL (ref 0.7–3.1)
Lymphs: 40 %
MCH: 36.1 pg — ABNORMAL HIGH (ref 26.6–33.0)
MCHC: 34.6 g/dL (ref 31.5–35.7)
MCV: 104 fL — ABNORMAL HIGH (ref 79–97)
Monocytes Absolute: 0.3 10*3/uL (ref 0.1–0.9)
Monocytes: 7 %
Neutrophils Absolute: 1.8 10*3/uL (ref 1.4–7.0)
Neutrophils: 50 %
Platelets: 129 10*3/uL — ABNORMAL LOW (ref 150–450)
RBC: 2.8 x10E6/uL — ABNORMAL LOW (ref 3.77–5.28)
RDW: 13.1 % (ref 11.7–15.4)
WBC: 3.6 10*3/uL (ref 3.4–10.8)

## 2020-09-03 LAB — LIPID PANEL
Chol/HDL Ratio: 3.3 ratio (ref 0.0–4.4)
Cholesterol, Total: 234 mg/dL — ABNORMAL HIGH (ref 100–199)
HDL: 72 mg/dL (ref >39)
LDL Chol Calc (NIH): 145 mg/dL — ABNORMAL HIGH (ref 0–99)
Triglycerides: 99 mg/dL (ref 0–149)
VLDL Cholesterol Cal: 17 mg/dL (ref 5–40)

## 2020-09-03 LAB — TSH+FREE T4
Free T4: 1.47 ng/dL (ref 0.82–1.77)
TSH: 1.11 u[IU]/mL (ref 0.450–4.500)

## 2020-09-03 MED ORDER — DEXAMETHASONE 2 MG PO TABS: 2.0000 mg | ORAL_TABLET | Freq: Every day | ORAL | 1 refills | Status: DC

## 2020-09-03 NOTE — Progress Notes (Signed)
[facility], [place] [id number] [phone]   Interval Evaluation  Date of Service: 09/03/20 Patient Name: [name] Riley Patient MRN: [id number] Patient DOB: 1951/07/09 Provider: Ventura Sellers, MD  Identifying Statement:  [name] Riley is a 69 y.o. female with seizures, brain metastases  Primary Cancer:  Oncologic History: Oncology History  Breast cancer of upper-outer quadrant of left female breast ([place])  11/14/2011 Surgery   Bilateral mastectomy, prophylactic on the right, left breast IDC 3/18 lymph nodes positive with extracapsular extension ER 89%, PR 81%, HER-2 negative, Ki-67 79% T2 N1 A. stage IIB   12/13/2011 - 06/28/2012 Chemotherapy   4 cycles of FEC followed by 4 cycles of Taxotere   07/17/2012 - 08/22/2012 Radiation Therapy   Adjuvant radiation therapy   08/22/2012 - 03/16/2016 Anti-estrogen oral therapy   Arimidex 1 mg daily   03/16/2016 Relapse/Recurrence   Subcutaneous nodule excision left chest: Infiltrating carcinoma breast primary, ER positive, PR negative   03/29/2016 Imaging   CT CAP and bone scan: Lytic lesions T8 vertebral, T1 posterior element, subcutaneous nodule left lateral chest wall, nonspecific lung nodules; Bone scan: Mets to kull, left humerus, left eighth rib, T7/T8, sternum, left acetabulum   04/28/2016 - 06/17/2017 Chemotherapy   Herceptin, lapatinib, Faslodex, Zometa every 4 weeks, lapatinib discontinued in September 2018 due to elevation of LFTs   06/22/2017 Relapse/Recurrence   Surgical excision:Soft tissue mass left lateral chest wall primary breast cancer, soft tissue mass left medial chest wall breast cancer, tumor is within the dermis extending to the subcutaneous adipose tissue and involves portions of skeletal muscle   06/22/2017 Cancer Staging   Staging form: Breast, AJCC 7th Edition - Pathologic stage from 06/22/2017: Stage IV (TX, NX, M1) - Signed by Nicholas Lose, MD on  12/27/2019   08/2017 - 02/16/2018 Chemotherapy   Faslodex with Herceptin and Perjeta along with Zometa every 4 weeks   03/02/2018 - 05/25/2018 Chemotherapy   [place]la   05/11/2018 Imaging   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 Associated vasogenic edema within the underlying right cerebral hemisphere without significant midline shift. Signal abnormality throughout the visualized bone marrow, compatible with osseous metastatic disease.    06/04/2018 Imaging   CT CAP: Right lower lobe lung nodule 7 mm (was 5 mm); multiple bone metastases throughout the spine and ribs sternum scapula and humerus, slightly increased lower thoracic mets, right renal lesion 2.5 cm (was 1.2 cm) right femur met increased from 2.1 cm to 2.9 cm   06/15/2018 -  Anti-estrogen oral therapy   Abemaciclib, Herceptin, letrozole, Xgeva   06/07/2019 Imaging   Progression of brain metastases,2 discrete dural-based lesions involving the posterior right frontal lobe. The more anterior lesion now measures 16.5 x 17.5 x 14 mm. The posterior lesion now measures 9.5 x 13 x 20 mm.  Vasogenic edema of the frontal and parietal lobes   06/21/2019 - 06/27/2019 Radiation Therapy   SRS to the brain   07/19/2020 Relapse/Recurrence   Left mastectomy scar nodule: Biopsy invasive carcinoma ER 60%, PR 0%, HER-2 negative by [facility]   07/29/2020 Surgery   Soft tissue mass left chest wall excision: No evidence of malignancy.   Metastatic breast cancer ([place])  06/29/2017 Initial Diagnosis   Metastatic breast cancer ([place])   06/15/2018 -  Chemotherapy   The patient had trastuzumab (HERCEPTIN) 546 mg in sodium chloride 0.9 % 250 mL chemo infusion, 8 mg/kg = 546 mg, Intravenous,  Once,  13 of 13 cycles Administration: 546 mg (06/15/2018), 399 mg (09/07/2018), 399 mg (07/13/2018), 399 mg (10/12/2018), 399 mg (11/02/2018), 399 mg (11/30/2018), 399 mg (01/24/2019), 399 mg (12/28/2018), 399 mg (02/22/2019), 399 mg (03/22/2019), 399 mg (04/17/2019),  399 mg (08/10/2018), 399 mg (05/17/2019) trastuzumab-dkst (OGIVRI) 399 mg in sodium chloride 0.9 % 250 mL chemo infusion, 6 mg/kg = 399 mg (100 % of original dose 6 mg/kg), Intravenous,  Once, 16 of 17 cycles Dose modification: 6 mg/kg (original dose 6 mg/kg, Cycle 14, Reason: Other (see comments), Comment: Biosimilar Conversion) Administration: 399 mg (06/14/2019), 399 mg (07/12/2019), 399 mg (08/09/2019), 399 mg (09/06/2019), 399 mg (10/04/2019), 399 mg (11/01/2019), 399 mg (11/29/2019), 399 mg (12/27/2019), 399 mg (01/24/2020), 399 mg (02/21/2020), 399 mg (03/20/2020), 399 mg (04/17/2020), 399 mg (05/15/2020), 399 mg (06/12/2020), 399 mg (07/10/2020), 399 mg (08/07/2020)  for chemotherapy treatment.      Interval History:  [name] Riley presents today after recent MRI brain. She does describe some clumsiness and maybe even subtle weakenss of her left hand.  She struggled to turn pages in a book this week.  No recurrence of seizures.  No additional headaches.  Medications: Current Outpatient Medications on File Prior to Visit  Medication Sig Dispense Refill  . abemaciclib (VERZENIO) 100 MG tablet Take 1 tablet (100 mg total) by mouth 2 (two) times daily. Swallow tablets whole. Do not chew, crush, or split tablets before swallowing. 56 tablet 3  . acetaminophen (TYLENOL) 500 MG tablet Take 1,000 mg by mouth every 6 (six) hours as needed for moderate pain.    . Ascorbic Acid (VITAMIN C PO) Take by mouth.    . cholecalciferol (VITAMIN D) 1000 units tablet Take 1 tablet (1,000 Units total) by mouth daily.    Marland Kitchen letrozole (FEMARA) 2.5 MG tablet TAKE ONE (1) TABLET EACH DAY 90 tablet 3  . levETIRAcetam (KEPPRA) 250 MG tablet Take 1 tablet (250 mg total) by mouth 2 (two) times daily.    Marland Kitchen levothyroxine (SYNTHROID) 100 MCG tablet TAKE ONE TABLET EACH MORNING BEFORE BREAKFAST 90 tablet 0  . lidocaine-prilocaine (EMLA) cream APPLY TOPICALLY AS NEEDED FOR PORT ACCESS 30 g 3  . metoprolol succinate (TOPROL-XL) 100 MG 24  hr tablet TAKE ONE (1) TABLET EACH DAY 90 tablet 0  . Multiple Vitamins-Minerals (MULTIVITAMIN WITH MINERALS) tablet Take 1 tablet by mouth daily.      . ondansetron (ZOFRAN-ODT) 4 MG disintegrating tablet TAKE 1 TABLET EVERY 6 HOURS AS NEEDED FOR NAUSEA AND VOMITING 30 tablet 3  . rosuvastatin (CRESTOR) 5 MG tablet Take 1 tablet by mouth 2 (two) times daily.     Marland Kitchen triamcinolone (KENALOG) 0.025 % ointment Apply 1 application topically 2 (two) times daily. 80 g 1  . valsartan (DIOVAN) 80 MG tablet TAKE ONE (1) TABLET EACH DAY 90 tablet 1   No current facility-administered medications on file prior to visit.    Allergies:  Allergies  Allergen Reactions  . Cantaloupe Extract Allergy Skin Test Shortness Of Breath  . Contrast Media [Iodinated Diagnostic Agents] Shortness Of Breath and Rash  . Pravastatin Other (See Comments)    Legs hurt  . Zosyn [Piperacillin Sod-Tazobactam So] Rash and Other (See Comments)    Temperature increase, facial flushing  . Latex Rash    Redness, itch    Past Medical History:  Past Medical History:  Diagnosis Date  . Allergy   . Breast cancer ([place]) 08/25/2011   L , invasive ductal carcinoma, ER/PR +,HER2 -  . Cancer (  [place])    left breast cancer  . Coronary artery disease 2001  . Heart attack [facility]) 09/2000   Sep 25, 2000  --no intervention  . History of chemotherapy comp. 08/22/2012   4 cycles of FEC and $ cycles of Taxotere  . Hyperlipidemia   . Hypertension   . Hypothyroidism   . PONV (postoperative nausea and vomiting)    gets sick from anesthesia  . Status post radiation therapy 07/09/12 - 08/22/2012   Left Breast, 60.4 gray   Past Surgical History:  Past Surgical History:  Procedure Laterality Date  . ABDOMINAL HYSTERECTOMY  1998   TAH, oophorectomy  . APPENDECTOMY  1970  . BREAST CYST EXCISION Left 07/29/2020   Procedure: WIDE EXCISION OF LEFT MASTECTOMY INCISION;  Surgeon: Donnie Mesa, MD;  Location: [facility];  Service:  General;  Laterality: Left;  . BREAST SURGERY  1998   removal of benign lump in rt breast  . BREAST SURGERY  11/14/11   right simple mastectomy, left mrm  . INCISION AND DRAINAGE OF WOUND Left 07/01/2017   Procedure: IRRIGATION AND DEBRIDEMENT CHEST WALL ABSCESS;  Surgeon: Donnie Mesa, MD;  Location: [facility];  Service: General;  Laterality: Left;  Marland Kitchen MASS EXCISION Left 06/22/2017   Procedure: EXCISION OF CHEST WALL MASSES;  Surgeon: Donnie Mesa, MD;  Location: [facility];  Service: General;  Laterality: Left;  Marland Kitchen MASTECTOMY Bilateral    for left breast cancer  . OVARIAN CYST SURGERY Right 1970  . PORT-A-CATH REMOVAL  08/30/2012   Procedure: REMOVAL PORT-A-CATH;  Surgeon: Imogene Burn. [name], MD;  Location: [place];  Service: General;  Laterality: Right;  port removal  . PORTACATH PLACEMENT  11/14/2011   Procedure: INSERTION PORT-A-CATH;  Surgeon: Imogene Burn. [name], MD;  Location: [facility];  Service: General;  Laterality: Right;  . PORTACATH PLACEMENT N/A 04/25/2016   Procedure: INSERTION PORT-A-CATH LEFT CHEST;  Surgeon: Donnie Mesa, MD;  Location: [facility];  Service: General;  Laterality: N/A;  . skin tags  05/09/1997   left axillary left neck skin tags  . TONSILLECTOMY  1968   Social History:  Social History   Socioeconomic History  . Marital status: Married    Spouse name: Not on file  . Number of children: 3  . Years of education: Not on file  . Highest education level: High school graduate  Occupational History  . Occupation: Works at [employer]  Tobacco Use  . Smoking status: Never Smoker  . Smokeless tobacco: Never Used  Vaping Use  . Vaping Use: Never used  Substance and Sexual Activity  . Alcohol use: No  . Drug use: No  . Sexual activity: Yes    Birth control/protection: Surgical    Comment: menarche 25, Parity age 47, G63, P3, 1 miscarriage,  HRT x 5-10 yrs, Mild Hot Flashes  Other Topics Concern  . Not on file  Social History  Narrative   Lives at home.   Social Determinants of Health   Financial Resource Strain:   . Difficulty of Paying Living Expenses: Not on file  Food Insecurity:   . Worried About Charity fundraiser in the Last Year: Not on file  . Ran Out of Food in the Last Year: Not on file  Transportation Needs:   . Lack of Transportation (Medical): Not on file  . Lack of Transportation (Non-Medical): Not on file  Physical Activity:   . Days of Exercise per Week: Not on  file  . Minutes of Exercise per Session: Not on file  Stress:   . Feeling of Stress : Not on file  Social Connections:   . Frequency of Communication with Friends and Family: Not on file  . Frequency of Social Gatherings with Friends and Family: Not on file  . Attends Religious Services: Not on file  . Active Member of Clubs or Organizations: Not on file  . Attends Archivist Meetings: Not on file  . Marital Status: Not on file  Intimate Partner Violence:   . Fear of Current or Ex-Partner: Not on file  . Emotionally Abused: Not on file  . Physically Abused: Not on file  . Sexually Abused: Not on file   Family History:  Family History  Problem Relation Age of Onset  . Hypertension Maternal Grandmother   . Diabetes Maternal Grandmother   . Cancer Father 74       lung cancer and Prostate Cancer  . Hypertension Mother   . Cancer Paternal Aunt        ovarian  . Cancer Cousin        breast, paternal cousin  . Cancer Paternal Uncle        stomach  . Cancer Paternal Grandfather        Esophagus  . Colon cancer Neg Hx     Review of Systems: Constitutional: Denies fevers, chills or abnormal weight loss Eyes: Denies blurriness of vision Ears, nose, mouth, throat, and face: Denies mucositis or sore throat Respiratory: Denies cough, dyspnea or wheezes Cardiovascular: Denies palpitation, chest discomfort or lower extremity swelling Gastrointestinal:  Denies nausea, constipation, diarrhea GU: Denies dysuria or  incontinence Skin: Denies abnormal skin rashes Neurological: Per HPI Musculoskeletal: Denies joint pain, back or neck discomfort. No decrease in ROM Behavioral/Psych: Denies anxiety, disturbance in thought content, and mood instability   Physical Exam: Vitals:   09/03/20 0849  BP: (!) 151/79  Pulse: 85  Resp: 18  Temp: (!) 97 F (36.1 C)  SpO2: 100%   KPS: 80. General: Alert, cooperative, pleasant, in no acute distress Head: Normal EENT: No conjunctival injection or scleral icterus. Oral mucosa moist Lungs: Resp effort normal Cardiac: Regular rate and rhythm Abdomen: Soft, non-distended abdomen Skin: No rashes cyanosis or petechiae. Extremities: No clubbing or edema  Neurologic Exam: Mental Status: Awake, alert, attentive to examiner. Oriented to self and environment. Language is fluent with intact comprehension.  Cranial Nerves: Visual acuity is grossly normal. Visual fields are full. Extra-ocular movements intact. No ptosis. Face is symmetric, tongue midline. Motor: Tone and bulk are normal. Arms and legs 5/5. Reflexes are symmetric, no pathologic reflexes present. Intact finger to nose bilaterally Sensory: Normal Gait: Normal and tandem gait is deferred   Labs: I have reviewed the data as listed    Component Value Date/Time   NA 141 09/02/2020 0806   NA 140 09/22/2017 0958   K 4.5 09/02/2020 0806   K 3.7 09/22/2017 0958   CL 102 09/02/2020 0806   CL 105 08/10/2012 0908   CO2 25 09/02/2020 0806   CO2 26 09/22/2017 0958   GLUCOSE 88 09/02/2020 0806   GLUCOSE 105 (H) 08/17/2020 0639   GLUCOSE 80 09/22/2017 0958   GLUCOSE 81 08/10/2012 0908   BUN 16 09/02/2020 0806   BUN 12.8 09/22/2017 0958   CREATININE 0.91 09/02/2020 0806   CREATININE 0.86 08/07/2020 0830   CREATININE 0.6 09/22/2017 0958   CALCIUM 9.7 09/02/2020 0806   CALCIUM 9.1 09/22/2017 0958  PROT 6.3 09/02/2020 0806   PROT 7.0 09/22/2017 0958   ALBUMIN 4.1 09/02/2020 0806   ALBUMIN 4.0 09/22/2017  0958   AST 42 (H) 09/02/2020 0806   AST 46 (H) 08/07/2020 0830   AST 37 (H) 09/22/2017 0958   ALT 22 09/02/2020 0806   ALT 32 08/07/2020 0830   ALT 31 09/22/2017 0958   ALKPHOS 193 (H) 09/02/2020 0806   ALKPHOS 140 09/22/2017 0958   BILITOT 0.5 09/02/2020 0806   BILITOT 0.5 08/07/2020 0830   BILITOT 0.41 09/22/2017 0958   GFRNONAA 65 09/02/2020 0806   GFRNONAA >60 08/17/2020 0639   GFRNONAA >60 08/07/2020 0830   GFRAA 76 09/02/2020 0806   GFRAA >60 07/10/2020 0820   Lab Results  Component Value Date   WBC 3.6 09/02/2020   NEUTROABS 1.8 09/02/2020   HGB 10.1 (L) 09/02/2020   HCT 29.2 (L) 09/02/2020   MCV 104 (H) 09/02/2020   PLT 129 (L) 09/02/2020     Imaging:  [place] Clinician Interpretation: I have personally reviewed the CNS images as listed.  My interpretation, in the context of the patient's clinical presentation, is treatment effect vs true progression  MR Brain W Wo Contrast  Result Date: 08/24/2020 CLINICAL DATA:  Brain/CNS neoplasm, surveillance. EXAM: MRI HEAD WITHOUT AND WITH CONTRAST TECHNIQUE: Multiplanar, multiecho pulse sequences of the brain and surrounding structures were obtained without and with intravenous contrast. CONTRAST:  6.51m GADAVIST GADOBUTROL 1 MMOL/ML IV SOLN COMPARISON:  MRI of the brain May 01, 2020 FINDINGS: Brain: Again seen and is mild diffuse pachymeningeal thickening and enhancement. Area of focal nodular thickening in the right frontal region at the level the superior frontal gyrus appear increased from prior MRI, measuring up to 16 mm (12 mm on prior) with increased edema within the subjacent brain parenchyma. The nodule a thickening at the level of the central sulcus is stable in size with increased edema in the subjacent brain parenchyma. Other 2 areas of focal nodular enhancement remain unchanged. No new focus of nodular contrast enhancement identified. No acute infarct, hemorrhage or extra-axial fluid collection. Vascular: Normal flow  voids. Skull and upper cervical spine: Unchanged sclerosis of the visualized upper cervical spine, right mandibular condyle, as well as multiple sclerotic lesions in the calvarium, particularly in the right frontal and parietal bones, extending into the lateral right orbital wall. Sinuses/Orbits: Mild mucosal thickening of the ethmoid cells and maxillary sinus with mucous retention cysts in the bilateral maxillary. The intraorbital structures are maintained. Other: None. IMPRESSION: 1. Area of focal nodular thickening in the right frontal region at the level the superior frontal gyrus appear increased from prior MRI, measuring up to 16 mm (12 mm on prior). 2. Other 3 areas of focal nodular enhancement remain unchanged. No new focus of nodular contrast enhancement identified. 3. Increased edema within the right frontal and parietal lobes. 4. Mild diffuse pachymeningeal thickening and enhancement, unchanged. 5. Unchanged sclerotic lesions within the visualized upper cervical spine, right mandibular condyle, and calvarium, extending into the lateral right orbital wall. Electronically Signed   By: KPedro EarlsM.D.   On: 08/24/2020 18:05      Assessment/Plan 1. Brain metastasis ([facility]  Ms. PDeakdemonstrates subtle clinical findings today, subjectively, which are consistent with right frontal lobe dyfunction.  MRI demonstrates some progression of enhancing burden in the right frontal convexity, along with increased degree of edema/T2 signal extending down into pre-central gyrus.   These findings could be consistent with either tumor progression or  radionecrosis.  Will continue to monitor with close imaging surveillance, recommend next MRI brain in 2 months.  PET and MR perfusion are unlikely to be useful for dural based disease of this volume.    For symptoms and edema, recommend dosing dexamethasone 67m daily.  We will call her in 10-12 days to touch base regarding effectiveness of  decadron.  Recommended continuing Keppra 2539mBID for seizure prevention.  We appreciate the opportunity to participate in the care of [name] Riley.    We ask that [name] FULFEReturn to clinic in 2 months following next brain MRI, or sooner as needed.    All questions were answered. The patient knows to call the clinic with any problems, questions or concerns. No barriers to learning were detected.  I have spent a total of 40 minutes of face-to-face and non-face-to-face time, excluding clinical staff time, preparing to see patient, ordering tests and/or medications, counseling the patient, and independently interpreting results and communicating results to the patient/family/caregiver    ZaVentura SellersMD Medical Director of Neuro-Oncology [facility] [place]1/18/21 9:03 AM

## 2020-09-04 ENCOUNTER — Ambulatory Visit: Payer: Medicare Other | Admitting: Internal Medicine

## 2020-09-04 ENCOUNTER — Other Ambulatory Visit: Payer: Self-pay

## 2020-09-04 ENCOUNTER — Telehealth: Payer: Self-pay | Admitting: Internal Medicine

## 2020-09-04 ENCOUNTER — Inpatient Hospital Stay: Payer: Medicare Other

## 2020-09-04 ENCOUNTER — Ambulatory Visit: Payer: Medicare Other | Admitting: Hematology and Oncology

## 2020-09-04 VITALS — BP 148/84 | HR 70 | Temp 98.4°F | Resp 18

## 2020-09-04 DIAGNOSIS — C50412 Malignant neoplasm of upper-outer quadrant of left female breast: Secondary | ICD-10-CM

## 2020-09-04 DIAGNOSIS — C7951 Secondary malignant neoplasm of bone: Secondary | ICD-10-CM

## 2020-09-04 DIAGNOSIS — Z5111 Encounter for antineoplastic chemotherapy: Secondary | ICD-10-CM | POA: Diagnosis not present

## 2020-09-04 DIAGNOSIS — Z95828 Presence of other vascular implants and grafts: Secondary | ICD-10-CM

## 2020-09-04 DIAGNOSIS — Z170 Estrogen receptor positive status [ER+]: Secondary | ICD-10-CM

## 2020-09-04 DIAGNOSIS — C50919 Malignant neoplasm of unspecified site of unspecified female breast: Secondary | ICD-10-CM

## 2020-09-04 LAB — CBC WITH DIFFERENTIAL (CANCER CENTER ONLY)
Abs Immature Granulocytes: 0.01 10*3/uL (ref 0.00–0.07)
Basophils Absolute: 0 10*3/uL (ref 0.0–0.1)
Basophils Relative: 1 %
Eosinophils Absolute: 0 10*3/uL (ref 0.0–0.5)
Eosinophils Relative: 1 %
HCT: 28.8 % — ABNORMAL LOW (ref 36.0–46.0)
Hemoglobin: 9.6 g/dL — ABNORMAL LOW (ref 12.0–15.0)
Immature Granulocytes: 0 %
Lymphocytes Relative: 43 %
Lymphs Abs: 1.2 10*3/uL (ref 0.7–4.0)
MCH: 35.6 pg — ABNORMAL HIGH (ref 26.0–34.0)
MCHC: 33.3 g/dL (ref 30.0–36.0)
MCV: 106.7 fL — ABNORMAL HIGH (ref 80.0–100.0)
Monocytes Absolute: 0.3 10*3/uL (ref 0.1–1.0)
Monocytes Relative: 10 %
Neutro Abs: 1.2 10*3/uL — ABNORMAL LOW (ref 1.7–7.7)
Neutrophils Relative %: 45 %
Platelet Count: 110 10*3/uL — ABNORMAL LOW (ref 150–400)
RBC: 2.7 MIL/uL — ABNORMAL LOW (ref 3.87–5.11)
RDW: 13.7 % (ref 11.5–15.5)
WBC Count: 2.7 10*3/uL — ABNORMAL LOW (ref 4.0–10.5)
nRBC: 0 % (ref 0.0–0.2)

## 2020-09-04 LAB — CMP (CANCER CENTER ONLY)
ALT: 21 U/L (ref 0–44)
AST: 40 U/L (ref 15–41)
Albumin: 3.5 g/dL (ref 3.5–5.0)
Alkaline Phosphatase: 161 U/L — ABNORMAL HIGH (ref 38–126)
Anion gap: 7 (ref 5–15)
BUN: 15 mg/dL (ref 8–23)
CO2: 28 mmol/L (ref 22–32)
Calcium: 9.3 mg/dL (ref 8.9–10.3)
Chloride: 104 mmol/L (ref 98–111)
Creatinine: 0.84 mg/dL (ref 0.44–1.00)
GFR, Estimated: >60 mL/min (ref >60)
Glucose, Bld: 93 mg/dL (ref 70–99)
Potassium: 3.7 mmol/L (ref 3.5–5.1)
Sodium: 139 mmol/L (ref 135–145)
Total Bilirubin: 0.5 mg/dL (ref 0.3–1.2)
Total Protein: 6.7 g/dL (ref 6.5–8.1)

## 2020-09-04 MED ORDER — DENOSUMAB 120 MG/1.7ML ~~LOC~~ SOLN
120.0000 mg | Freq: Once | SUBCUTANEOUS | Status: AC
  Administered 2020-09-04: 120 mg via SUBCUTANEOUS

## 2020-09-04 MED ORDER — DIPHENHYDRAMINE HCL 25 MG PO CAPS
ORAL_CAPSULE | ORAL | Status: AC
  Filled 2020-09-04: qty 2

## 2020-09-04 MED ORDER — SODIUM CHLORIDE 0.9% FLUSH
10.0000 mL | INTRAVENOUS | Status: DC | PRN
  Administered 2020-09-04: 10 mL
  Filled 2020-09-04: qty 10

## 2020-09-04 MED ORDER — DENOSUMAB 120 MG/1.7ML ~~LOC~~ SOLN
SUBCUTANEOUS | Status: AC
  Filled 2020-09-04: qty 1.7

## 2020-09-04 MED ORDER — SODIUM CHLORIDE 0.9% FLUSH
10.0000 mL | Freq: Once | INTRAVENOUS | Status: AC
  Administered 2020-09-04: 10 mL
  Filled 2020-09-04: qty 10

## 2020-09-04 MED ORDER — HEPARIN SOD (PORK) LOCK FLUSH 100 UNIT/ML IV SOLN
500.0000 [IU] | Freq: Once | INTRAVENOUS | Status: AC | PRN
  Administered 2020-09-04: 500 [IU]
  Filled 2020-09-04: qty 5

## 2020-09-04 MED ORDER — TRASTUZUMAB-DKST CHEMO 150 MG IV SOLR
6.0000 mg/kg | Freq: Once | INTRAVENOUS | Status: AC
  Administered 2020-09-04: 399 mg via INTRAVENOUS
  Filled 2020-09-04: qty 19

## 2020-09-04 MED ORDER — ACETAMINOPHEN 325 MG PO TABS
ORAL_TABLET | ORAL | Status: AC
  Filled 2020-09-04: qty 2

## 2020-09-04 MED ORDER — DIPHENHYDRAMINE HCL 25 MG PO CAPS
50.0000 mg | ORAL_CAPSULE | Freq: Once | ORAL | Status: AC
  Administered 2020-09-04: 50 mg via ORAL

## 2020-09-04 MED ORDER — SODIUM CHLORIDE 0.9 % IV SOLN
Freq: Once | INTRAVENOUS | Status: AC
  Filled 2020-09-04: qty 250

## 2020-09-04 MED ORDER — ACETAMINOPHEN 325 MG PO TABS
650.0000 mg | ORAL_TABLET | Freq: Once | ORAL | Status: AC
  Administered 2020-09-04: 650 mg via ORAL

## 2020-09-04 MED FILL — VERZENIO 100 MG TAB: 100 | 28 days supply | Qty: 56 | Fill #2

## 2020-09-04 NOTE — Telephone Encounter (Signed)
Scheduled per 11/18 los. Pt is aware of appt times and dates.

## 2020-09-04 NOTE — Patient Instructions (Signed)
[facility] Discharge Instructions for Patients Receiving Chemotherapy  Today you received the following chemotherapy agents: trastuzumab & xgeva   To help prevent nausea and vomiting after your treatment, we encourage you to take your nausea medication as directed.    If you develop nausea and vomiting that is not controlled by your nausea medication, call the clinic.   BELOW ARE SYMPTOMS THAT SHOULD BE REPORTED IMMEDIATELY:  *FEVER GREATER THAN 100.5 F  *CHILLS WITH OR WITHOUT FEVER  NAUSEA AND VOMITING THAT IS NOT CONTROLLED WITH YOUR NAUSEA MEDICATION  *UNUSUAL SHORTNESS OF BREATH  *UNUSUAL BRUISING OR BLEEDING  TENDERNESS IN MOUTH AND THROAT WITH OR WITHOUT PRESENCE OF ULCERS  *URINARY PROBLEMS  *BOWEL PROBLEMS  UNUSUAL RASH Items with * indicate a potential emergency and should be followed up as soon as possible.  Feel free to call the clinic should you have any questions or concerns. The clinic phone number is [phone].  Please show the [place] at check-in to the Emergency Department and triage nurse.

## 2020-09-07 ENCOUNTER — Telehealth: Payer: Self-pay

## 2020-09-07 NOTE — Telephone Encounter (Signed)
R/C on labs 

## 2020-09-07 NOTE — Telephone Encounter (Signed)
Patient aware of lab results.

## 2020-09-15 ENCOUNTER — Inpatient Hospital Stay (HOSPITAL_BASED_OUTPATIENT_CLINIC_OR_DEPARTMENT_OTHER): Payer: Medicare Other | Admitting: Internal Medicine

## 2020-09-15 DIAGNOSIS — C7931 Secondary malignant neoplasm of brain: Secondary | ICD-10-CM

## 2020-09-15 NOTE — Progress Notes (Signed)
I connected with [name] Riley on 09/15/20 at 11:00 AM EST by telephone visit and verified that I am speaking with the correct person using two identifiers.  I discussed the limitations, risks, security and privacy concerns of performing an evaluation and management service by telemedicine and the availability of in-person appointments. I also discussed with the patient that there may be a patient responsible charge related to this service. The patient expressed understanding and agreed to proceed.  Other persons participating in the visit and their role in the encounter:  n/a  Patient's location:  Home  Provider's location:  Office  Chief Complaint:  Brain metastasis [facility]) - Plan: MR BRAIN W WO CONTRAST  History of Present Ilness: [name] Riley describes modest improvement in left hand function since starting the decadron 2mg  daily.  Unfortunately she also describes mood swings, increased appetite, agitation and insomnia.  She would prefer stopping the drug despite its functional benefit. Observations: Language and cognition at baseline Assessment and Plan: Brain metastasis ([place]) - Plan: MR BRAIN W WO CONTRAST  Motor imporovement with dex is encouraging, we are hopeful radiographic changes are consistent with treatment effect.  Will stop steroids at this time due to poor tolerance.  Follow Up Instructions: RTC in January following MRI brain  I discussed the assessment and treatment plan with the patient.  The patient was provided an opportunity to ask questions and all were answered.  The patient agreed with the plan and demonstrated understanding of the instructions.    The patient was advised to call back or seek an in-person evaluation if the symptoms worsen or if the condition fails to improve as anticipated.  I provided 5-10 minutes of non-face-to-face time during this enocunter.  Ventura Sellers, MD   I provided 15 minutes of non face-to-face telephone visit time during this  encounter, and > 50% was spent counseling as documented under my assessment & plan.

## 2020-10-01 ENCOUNTER — Other Ambulatory Visit: Payer: Self-pay | Admitting: Internal Medicine

## 2020-10-01 ENCOUNTER — Telehealth: Payer: Self-pay | Admitting: *Deleted

## 2020-10-01 MED ORDER — DEXAMETHASONE 1 MG PO TABS
1.0000 mg | ORAL_TABLET | Freq: Every day | ORAL | 1 refills | Status: DC
Start: 2020-10-01 — End: 2020-10-30

## 2020-10-01 NOTE — Telephone Encounter (Signed)
Patient called to report recurrence of issues she previously had with clumsiness and weakness in left hand.  Previously she was put on Decadron 2 mg once day which resolved the issue however she had insomnia and did eventually come off of the Decadron on 09/17/2021.    She is calling today as the same symptoms have reappeared and wanted guidance on if she could restart the Decadron and if so at what dose with hopes of resolving symptoms but also not causing insomnia.  Has on hand enough for 2 mg daily for two weeks.  Refill would be warranted after that.     Routed to Dr. Mickeal Skinner to please advise.

## 2020-10-01 NOTE — Telephone Encounter (Signed)
Per Dr. Mickeal Skinner patient can restart Decadron 1mg  daily to see if it resolves symptoms.  Communicated to patient.  No further questions.

## 2020-10-01 NOTE — Assessment & Plan Note (Signed)
Current treatment: Abemaciclibwith letrozole along with Herceptin(to be given every 4 weeks)and Xgeva started 06/15/2018  Toxicities:Cytopenias Thrombocytopenia: Monitoring closely,platelet count116 Anemia: Today's hemoglobin is9.9 Leukopenia: Today's ANC1.2 Diarrhea is under good control with a lower dosage of abemaciclib.  CT CAP 05/25/2020: No significant interval change in the bone metastases. 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  I encouraged her tocontinueabemaciclib and Herceptin with Xgeva..  Return to clinicmonthly for Herceptin and every other month with labs for follow-up to see me

## 2020-10-01 NOTE — Progress Notes (Signed)
Patient Care Team: [name], MD as PCP - General (Family Medicine) Minus Breeding, MD as PCP - Cardiology (Cardiology) Nicholas Lose, MD as Consulting Physician (Hematology and Oncology)  DIAGNOSIS:    ICD-10-CM   1. Metastatic breast cancer ([place])  C50.919     SUMMARY OF ONCOLOGIC HISTORY: Oncology History  Breast cancer of upper-outer quadrant of left female breast ([place])  11/14/2011 Surgery   Bilateral mastectomy, prophylactic on the right, left breast IDC 3/18 lymph nodes positive with extracapsular extension ER 89%, PR 81%, HER-2 negative, Ki-67 79% T2 N1 A. stage IIB   12/13/2011 - 06/28/2012 Chemotherapy   4 cycles of FEC followed by 4 cycles of Taxotere   07/17/2012 - 08/22/2012 Radiation Therapy   Adjuvant radiation therapy   08/22/2012 - 03/16/2016 Anti-estrogen oral therapy   Arimidex 1 mg daily   03/16/2016 Relapse/Recurrence   Subcutaneous nodule excision left chest: Infiltrating carcinoma breast primary, ER positive, PR negative   03/29/2016 Imaging   CT CAP and bone scan: Lytic lesions T8 vertebral, T1 posterior element, subcutaneous nodule left lateral chest wall, nonspecific lung nodules; Bone scan: Mets to kull, left humerus, left eighth rib, T7/T8, sternum, left acetabulum   04/28/2016 - 06/17/2017 Chemotherapy   Herceptin, lapatinib, Faslodex, Zometa every 4 weeks, lapatinib discontinued in September 2018 due to elevation of LFTs   06/22/2017 Relapse/Recurrence   Surgical excision:Soft tissue mass left lateral chest wall primary breast cancer, soft tissue mass left medial chest wall breast cancer, tumor is within the dermis extending to the subcutaneous adipose tissue and involves portions of skeletal muscle   06/22/2017 Cancer Staging   Staging form: Breast, AJCC 7th Edition - Pathologic stage from 06/22/2017: Stage IV (TX, NX, M1) - Signed by Nicholas Lose, MD on 12/27/2019   08/2017 - 02/16/2018 Chemotherapy   Faslodex with Herceptin and Perjeta along with Zometa  every 4 weeks   03/02/2018 - 05/25/2018 Chemotherapy   Kadcyla   05/11/2018 Imaging   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 Associated vasogenic edema within the underlying right cerebral hemisphere without significant midline shift. Signal abnormality throughout the visualized bone marrow, compatible with osseous metastatic disease.    06/04/2018 Imaging   CT CAP: Right lower lobe lung nodule 7 mm (was 5 mm); multiple bone metastases throughout the spine and ribs sternum scapula and humerus, slightly increased lower thoracic mets, right renal lesion 2.5 cm (was 1.2 cm) right femur met increased from 2.1 cm to 2.9 cm   06/15/2018 -  Anti-estrogen oral therapy   Abemaciclib, Herceptin, letrozole, Xgeva   06/07/2019 Imaging   Progression of brain metastases,2 discrete dural-based lesions involving the posterior right frontal lobe. The more anterior lesion now measures 16.5 x 17.5 x 14 mm. The posterior lesion now measures 9.5 x 13 x 20 mm.  Vasogenic edema of the frontal and parietal lobes   06/21/2019 - 06/27/2019 Radiation Therapy   SRS to the brain   07/19/2020 Relapse/Recurrence   Left mastectomy scar nodule: Biopsy invasive carcinoma ER 60%, PR 0%, HER-2 negative by [facility]   07/29/2020 Surgery   Soft tissue mass left chest wall excision: No evidence of malignancy.   Metastatic breast cancer ([place])  06/29/2017 Initial Diagnosis   Metastatic breast cancer ([place])   06/15/2018 -  Chemotherapy   The patient had trastuzumab (HERCEPTIN) 546 mg in sodium chloride 0.9 % 250 mL chemo infusion, 8 mg/kg = 546 mg, Intravenous,  Once, 13 of 13 cycles Administration: 546 mg (06/15/2018), 399 mg (09/07/2018), 399  mg (07/13/2018), 399 mg (10/12/2018), 399 mg (11/02/2018), 399 mg (11/30/2018), 399 mg (01/24/2019), 399 mg (12/28/2018), 399 mg (02/22/2019), 399 mg (03/22/2019), 399 mg (04/17/2019), 399 mg (08/10/2018), 399 mg (05/17/2019) trastuzumab-dkst (OGIVRI) 399 mg in sodium chloride 0.9 % 250  mL chemo infusion, 6 mg/kg = 399 mg (100 % of original dose 6 mg/kg), Intravenous,  Once, 17 of 27 cycles Dose modification: 6 mg/kg (original dose 6 mg/kg, Cycle 14, Reason: Other (see comments), Comment: Biosimilar Conversion) Administration: 399 mg (06/14/2019), 399 mg (07/12/2019), 399 mg (08/09/2019), 399 mg (09/06/2019), 399 mg (10/04/2019), 399 mg (11/01/2019), 399 mg (11/29/2019), 399 mg (12/27/2019), 399 mg (01/24/2020), 399 mg (02/21/2020), 399 mg (03/20/2020), 399 mg (04/17/2020), 399 mg (05/15/2020), 399 mg (06/12/2020), 399 mg (07/10/2020), 399 mg (08/07/2020), 399 mg (09/04/2020)  for chemotherapy treatment.      CHIEF COMPLIANT: Follow-up on Herceptin with abemaciclib,letrozole,andXgeva  INTERVAL HISTORY: [name] Riley is a 69 y.o. with above-mentioned history of metastatic breast cancer currently on abemaciclib with letrozole along with HerceptinandXgeva. She presents to theclinictoday for treatment.   She tells me that the Decadron makes her extremely hungry and she discussed with Dr. Mickeal Skinner who reduce the dosage of Decadron from 2 mg to 1 mg daily with a plan to taper it off and discontinue.  She does me when she discontinues she gets joint stiffness and pains.  Her appetite has increased dramatically because of dexamethasone.  Energy levels also improved.  Does not have any further issues with diarrhea.    ALLERGIES:  is allergic to cantaloupe extract allergy skin test, contrast media [iodinated diagnostic agents], pravastatin, zosyn [piperacillin sod-tazobactam so], and latex.  MEDICATIONS:  Current Outpatient Medications  Medication Sig Dispense Refill  . abemaciclib (VERZENIO) 100 MG tablet Take 1 tablet (100 mg total) by mouth 2 (two) times daily. Swallow tablets whole. Do not chew, crush, or split tablets before swallowing. 56 tablet 3  . acetaminophen (TYLENOL) 500 MG tablet Take 1,000 mg by mouth every 6 (six) hours as needed for moderate pain.    . Ascorbic Acid (VITAMIN C PO)  Take by mouth.    . cholecalciferol (VITAMIN D) 1000 units tablet Take 1 tablet (1,000 Units total) by mouth daily.    Marland Kitchen dexamethasone (DECADRON) 1 MG tablet Take 1 tablet (1 mg total) by mouth daily. 30 tablet 1  . letrozole (FEMARA) 2.5 MG tablet TAKE ONE (1) TABLET EACH DAY 90 tablet 3  . levETIRAcetam (KEPPRA) 250 MG tablet Take 250 mg by mouth 2 (two) times daily.    Marland Kitchen levothyroxine (SYNTHROID) 100 MCG tablet TAKE ONE TABLET EACH MORNING BEFORE BREAKFAST 90 tablet 0  . lidocaine-prilocaine (EMLA) cream APPLY TOPICALLY AS NEEDED FOR PORT ACCESS 30 g 3  . metoprolol succinate (TOPROL-XL) 100 MG 24 hr tablet TAKE ONE (1) TABLET EACH DAY 90 tablet 0  . Multiple Vitamins-Minerals (MULTIVITAMIN WITH MINERALS) tablet Take 1 tablet by mouth daily.      . ondansetron (ZOFRAN-ODT) 4 MG disintegrating tablet TAKE 1 TABLET EVERY 6 HOURS AS NEEDED FOR NAUSEA AND VOMITING 30 tablet 3  . rosuvastatin (CRESTOR) 5 MG tablet Take 1 tablet by mouth 2 (two) times daily.     Marland Kitchen triamcinolone (KENALOG) 0.025 % ointment Apply 1 application topically 2 (two) times daily. 80 g 1  . valsartan (DIOVAN) 80 MG tablet TAKE ONE (1) TABLET EACH DAY 90 tablet 1   No current facility-administered medications for this visit.    PHYSICAL EXAMINATION: ECOG PERFORMANCE STATUS: 1 - Symptomatic  but completely ambulatory  Vitals:   10/02/20 0855  BP: (!) 151/76  Pulse: 78  Resp: 17  Temp: 98 F (36.7 C)  SpO2: 100%   Filed Weights   10/02/20 0855  Weight: 146 lb 9.6 oz (66.5 kg)     LABORATORY DATA:  I have reviewed the data as listed CMP Latest Ref Rng & Units 09/04/2020 09/02/2020 08/17/2020  Glucose 70 - 99 mg/dL 93 88 105(H)  BUN 8 - 23 mg/dL 15 16 15   Creatinine 0.44 - 1.00 mg/dL 0.84 0.91 0.88  Sodium 135 - 145 mmol/L 139 141 134(L)  Potassium 3.5 - 5.1 mmol/L 3.7 4.5 5.0  Chloride 98 - 111 mmol/L 104 102 101  CO2 22 - 32 mmol/L 28 25 22   Calcium 8.9 - 10.3 mg/dL 9.3 9.7 8.8(L)  Total Protein 6.5 - 8.1  g/dL 6.7 6.3 7.3  Total Bilirubin 0.3 - 1.2 mg/dL 0.5 0.5 1.6(H)  Alkaline Phos 38 - 126 U/L 161(H) 193(H) 151(H)  AST 15 - 41 U/L 40 42(H) 60(H)  ALT 0 - 44 U/L 21 22 25     Lab Results  Component Value Date   WBC 2.5 (L) 10/02/2020   HGB 10.0 (L) 10/02/2020   HCT 29.9 (L) 10/02/2020   MCV 107.2 (H) 10/02/2020   PLT 102 (L) 10/02/2020   NEUTROABS PENDING 10/02/2020    ASSESSMENT & PLAN:  Metastatic breast cancer ([place]) Current treatment: Abemaciclibwith letrozole along with Herceptin(to be given every 4 weeks)and Xgeva started 06/15/2018  Toxicities:Cytopenias Thrombocytopenia: Monitoring closely,platelet count102 Anemia: Today's hemoglobin is10 Leukopenia: Today's ANC1.2 Diarrhea is under good control with a lower dosage of abemaciclib.  CT CAP 05/25/2020: No significant interval change in the bone metastases. 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  I encouraged her tocontinueabemaciclib and Herceptin with Xgeva..  Return to clinicmonthly for Herceptin and every other month with labs for follow-up to see me Scans will be ordered in February 2022 prior to her visit.     No orders of the defined types were placed in this encounter.  The patient has a good understanding of the overall plan. she agrees with it. she will call with any problems that may develop before the next visit here.  Total time spent: 30 mins including face to face time and time spent for planning, charting and coordination of care  Nicholas Lose, MD 10/02/2020  I, Cloyde Reams Dorshimer, am acting as scribe for Dr. Nicholas Lose.  I have reviewed the above documentation for accuracy and completeness, and I agree with the above.

## 2020-10-02 ENCOUNTER — Other Ambulatory Visit: Payer: Self-pay | Admitting: Hematology and Oncology

## 2020-10-02 ENCOUNTER — Inpatient Hospital Stay (HOSPITAL_BASED_OUTPATIENT_CLINIC_OR_DEPARTMENT_OTHER): Payer: Medicare Other | Admitting: Hematology and Oncology

## 2020-10-02 ENCOUNTER — Inpatient Hospital Stay: Payer: Medicare Other | Attending: Hematology and Oncology

## 2020-10-02 ENCOUNTER — Other Ambulatory Visit: Payer: Self-pay

## 2020-10-02 ENCOUNTER — Inpatient Hospital Stay: Payer: Medicare Other

## 2020-10-02 DIAGNOSIS — C50412 Malignant neoplasm of upper-outer quadrant of left female breast: Secondary | ICD-10-CM | POA: Diagnosis present

## 2020-10-02 DIAGNOSIS — Z79811 Long term (current) use of aromatase inhibitors: Secondary | ICD-10-CM | POA: Diagnosis not present

## 2020-10-02 DIAGNOSIS — C50919 Malignant neoplasm of unspecified site of unspecified female breast: Secondary | ICD-10-CM | POA: Diagnosis not present

## 2020-10-02 DIAGNOSIS — R197 Diarrhea, unspecified: Secondary | ICD-10-CM | POA: Insufficient documentation

## 2020-10-02 DIAGNOSIS — Z923 Personal history of irradiation: Secondary | ICD-10-CM | POA: Insufficient documentation

## 2020-10-02 DIAGNOSIS — I25118 Atherosclerotic heart disease of native coronary artery with other forms of angina pectoris: Secondary | ICD-10-CM | POA: Diagnosis not present

## 2020-10-02 DIAGNOSIS — Z95828 Presence of other vascular implants and grafts: Secondary | ICD-10-CM

## 2020-10-02 DIAGNOSIS — Z79899 Other long term (current) drug therapy: Secondary | ICD-10-CM | POA: Diagnosis not present

## 2020-10-02 DIAGNOSIS — Z7952 Long term (current) use of systemic steroids: Secondary | ICD-10-CM | POA: Insufficient documentation

## 2020-10-02 DIAGNOSIS — D696 Thrombocytopenia, unspecified: Secondary | ICD-10-CM | POA: Insufficient documentation

## 2020-10-02 DIAGNOSIS — C7951 Secondary malignant neoplasm of bone: Secondary | ICD-10-CM

## 2020-10-02 DIAGNOSIS — D649 Anemia, unspecified: Secondary | ICD-10-CM | POA: Insufficient documentation

## 2020-10-02 DIAGNOSIS — Z170 Estrogen receptor positive status [ER+]: Secondary | ICD-10-CM

## 2020-10-02 DIAGNOSIS — C7931 Secondary malignant neoplasm of brain: Secondary | ICD-10-CM | POA: Diagnosis not present

## 2020-10-02 DIAGNOSIS — Z5189 Encounter for other specified aftercare: Secondary | ICD-10-CM | POA: Insufficient documentation

## 2020-10-02 DIAGNOSIS — C50912 Malignant neoplasm of unspecified site of left female breast: Secondary | ICD-10-CM | POA: Diagnosis not present

## 2020-10-02 DIAGNOSIS — D72819 Decreased white blood cell count, unspecified: Secondary | ICD-10-CM | POA: Insufficient documentation

## 2020-10-02 DIAGNOSIS — Z9221 Personal history of antineoplastic chemotherapy: Secondary | ICD-10-CM | POA: Insufficient documentation

## 2020-10-02 DIAGNOSIS — Z5111 Encounter for antineoplastic chemotherapy: Secondary | ICD-10-CM | POA: Diagnosis present

## 2020-10-02 LAB — CBC WITH DIFFERENTIAL (CANCER CENTER ONLY)
Abs Immature Granulocytes: 0.01 10*3/uL (ref 0.00–0.07)
Basophils Absolute: 0.1 10*3/uL (ref 0.0–0.1)
Basophils Relative: 2 %
Eosinophils Absolute: 0.1 10*3/uL (ref 0.0–0.5)
Eosinophils Relative: 2 %
HCT: 29.9 % — ABNORMAL LOW (ref 36.0–46.0)
Hemoglobin: 10 g/dL — ABNORMAL LOW (ref 12.0–15.0)
Immature Granulocytes: 0 %
Lymphocytes Relative: 46 %
Lymphs Abs: 1.2 10*3/uL (ref 0.7–4.0)
MCH: 35.8 pg — ABNORMAL HIGH (ref 26.0–34.0)
MCHC: 33.4 g/dL (ref 30.0–36.0)
MCV: 107.2 fL — ABNORMAL HIGH (ref 80.0–100.0)
Monocytes Absolute: 0.3 10*3/uL (ref 0.1–1.0)
Monocytes Relative: 11 %
Neutro Abs: 1 10*3/uL — ABNORMAL LOW (ref 1.7–7.7)
Neutrophils Relative %: 39 %
Platelet Count: 102 10*3/uL — ABNORMAL LOW (ref 150–400)
RBC: 2.79 MIL/uL — ABNORMAL LOW (ref 3.87–5.11)
RDW: 14.5 % (ref 11.5–15.5)
WBC Count: 2.5 10*3/uL — ABNORMAL LOW (ref 4.0–10.5)
nRBC: 0 % (ref 0.0–0.2)

## 2020-10-02 LAB — CMP (CANCER CENTER ONLY)
ALT: 37 U/L (ref 0–44)
AST: 48 U/L — ABNORMAL HIGH (ref 15–41)
Albumin: 3.5 g/dL (ref 3.5–5.0)
Alkaline Phosphatase: 176 U/L — ABNORMAL HIGH (ref 38–126)
Anion gap: 8 (ref 5–15)
BUN: 18 mg/dL (ref 8–23)
CO2: 28 mmol/L (ref 22–32)
Calcium: 9.7 mg/dL (ref 8.9–10.3)
Chloride: 104 mmol/L (ref 98–111)
Creatinine: 0.92 mg/dL (ref 0.44–1.00)
GFR, Estimated: >60 mL/min (ref >60)
Glucose, Bld: 116 mg/dL — ABNORMAL HIGH (ref 70–99)
Potassium: 3.6 mmol/L (ref 3.5–5.1)
Sodium: 140 mmol/L (ref 135–145)
Total Bilirubin: 0.5 mg/dL (ref 0.3–1.2)
Total Protein: 6.8 g/dL (ref 6.5–8.1)

## 2020-10-02 MED ORDER — SODIUM CHLORIDE 0.9 % IV SOLN
Freq: Once | INTRAVENOUS | Status: AC
  Filled 2020-10-02: qty 250

## 2020-10-02 MED ORDER — HEPARIN SOD (PORK) LOCK FLUSH 100 UNIT/ML IV SOLN
500.0000 [IU] | Freq: Once | INTRAVENOUS | Status: AC | PRN
  Administered 2020-10-02: 11:00:00 500 [IU]
  Filled 2020-10-02: qty 5

## 2020-10-02 MED ORDER — TRASTUZUMAB-DKST CHEMO 150 MG IV SOLR
6.0000 mg/kg | Freq: Once | INTRAVENOUS | Status: AC
  Administered 2020-10-02: 10:00:00 399 mg via INTRAVENOUS
  Filled 2020-10-02: qty 19

## 2020-10-02 MED ORDER — ACETAMINOPHEN 325 MG PO TABS
650.0000 mg | ORAL_TABLET | Freq: Once | ORAL | Status: AC
  Administered 2020-10-02: 10:00:00 650 mg via ORAL

## 2020-10-02 MED ORDER — DIPHENHYDRAMINE HCL 25 MG PO CAPS
ORAL_CAPSULE | ORAL | Status: AC
  Filled 2020-10-02: qty 2

## 2020-10-02 MED ORDER — ACETAMINOPHEN 325 MG PO TABS
ORAL_TABLET | ORAL | Status: AC
  Filled 2020-10-02: qty 2

## 2020-10-02 MED ORDER — SODIUM CHLORIDE 0.9% FLUSH
10.0000 mL | INTRAVENOUS | Status: DC | PRN
  Administered 2020-10-02: 11:00:00 10 mL
  Filled 2020-10-02: qty 10

## 2020-10-02 MED ORDER — DIPHENHYDRAMINE HCL 25 MG PO CAPS
50.0000 mg | ORAL_CAPSULE | Freq: Once | ORAL | Status: AC
  Administered 2020-10-02: 10:00:00 50 mg via ORAL

## 2020-10-02 MED ORDER — SODIUM CHLORIDE 0.9% FLUSH
10.0000 mL | Freq: Once | INTRAVENOUS | Status: AC
  Administered 2020-10-02: 09:00:00 10 mL
  Filled 2020-10-02: qty 10

## 2020-10-02 MED FILL — VERZENIO 100 MG TAB: 100 | 28 days supply | Qty: 56 | Fill #3

## 2020-10-02 NOTE — Patient Instructions (Signed)
[facility] Discharge Instructions for Patients Receiving Chemotherapy  Today you received the following chemotherapy agent: trastuzumab  To help prevent nausea and vomiting after your treatment, we encourage you to take your nausea medication as directed.    If you develop nausea and vomiting that is not controlled by your nausea medication, call the clinic.   BELOW ARE SYMPTOMS THAT SHOULD BE REPORTED IMMEDIATELY:  *FEVER GREATER THAN 100.5 F  *CHILLS WITH OR WITHOUT FEVER  NAUSEA AND VOMITING THAT IS NOT CONTROLLED WITH YOUR NAUSEA MEDICATION  *UNUSUAL SHORTNESS OF BREATH  *UNUSUAL BRUISING OR BLEEDING  TENDERNESS IN MOUTH AND THROAT WITH OR WITHOUT PRESENCE OF ULCERS  *URINARY PROBLEMS  *BOWEL PROBLEMS  UNUSUAL RASH Items with * indicate a potential emergency and should be followed up as soon as possible.  Feel free to call the clinic should you have any questions or concerns. The clinic phone number is [phone].  Please show the [facility] at check-in to the Emergency Department and triage nurse.

## 2020-10-19 ENCOUNTER — Telehealth: Payer: Self-pay

## 2020-10-19 ENCOUNTER — Other Ambulatory Visit: Payer: Self-pay | Admitting: Radiation Therapy

## 2020-10-19 NOTE — Telephone Encounter (Signed)
Oral Oncology Patient Advocate Encounter  Was successful in securing patient a $15000 grant from [employer] to provide copayment coverage for [employer].  This will keep the out of pocket expense at $0.     [employer] ID: [id number]  I have spoken with the patient.   The billing information is as follows and has been shared with [facility]     RxBin: [id number] PCN: [id number] Member ID: [id number] Group ID: [id number] Dates of Eligibility: 09/19/20 through 09/18/21  Fund:  Breast  Cruzita Lederer CPHT [facility] Patient Advocate [facility] Phone [phone] Fax [phone] 10/19/2020 1:45 PM

## 2020-10-22 ENCOUNTER — Other Ambulatory Visit: Payer: Self-pay | Admitting: Family Medicine

## 2020-10-22 DIAGNOSIS — I10 Essential (primary) hypertension: Secondary | ICD-10-CM

## 2020-10-22 DIAGNOSIS — C50919 Malignant neoplasm of unspecified site of unspecified female breast: Secondary | ICD-10-CM

## 2020-10-29 ENCOUNTER — Telehealth: Payer: Self-pay | Admitting: Neurology

## 2020-10-29 DIAGNOSIS — E785 Hyperlipidemia, unspecified: Secondary | ICD-10-CM | POA: Insufficient documentation

## 2020-10-29 DIAGNOSIS — E079 Disorder of thyroid, unspecified: Secondary | ICD-10-CM | POA: Insufficient documentation

## 2020-10-29 DIAGNOSIS — G40909 Epilepsy, unspecified, not intractable, without status epilepticus: Secondary | ICD-10-CM

## 2020-10-29 DIAGNOSIS — R29898 Other symptoms and signs involving the musculoskeletal system: Secondary | ICD-10-CM | POA: Insufficient documentation

## 2020-10-29 NOTE — Telephone Encounter (Signed)
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 Mickeal Skinner for the latter.  At her last appointment with Dr. Mickeal Skinner on 09/15/2020 her dexamethasone 2 mg daily was stopped due to side effects despite functional benefit of improved strength in her hand.  She did have worsening hand weakness with this medication change and restarted 1 mg daily around 10/01/2020.  This had initially been started 09/03/2020 given MRI brain with showing progression of enhancing burden in the right frontal convexity concerning for either tumor progression or radionecrosis.  Plan was for repeat MRI brain scheduled for 11/06/2020 with follow-up with Dr. 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  Her seizure activity resolved with administration of benzodiazepines as well as Keppra 1000 mg.  She was given 10 mg dexamethasone by ED provider.  I am unable to review her head CT personally but this demonstrated some worsening edema.  [facility] reports MRI is not available at their facility this week due to staff shortages.  I recommended continuing dexamethasone 2 mg every 8 hours, and increasing Keppra to at least 500 mg twice daily, preferably 750 mg twice daily if patient agreeable, with plan for close outpatient follow-up with Dr. Mickeal Skinner.  Dr. Shon Baton has also initiated infectious and metabolic work-up including CBC, CMP, UA, etc. though at this point there is low concern for other trigger of her seizure activity.  Should she have recurrent seizures despite these interventions she would be a candidate for transfer for more emergent neurologic evaluation, but at this time given her seizures have stopped and given known underlying pathology there is no indication for urgent transfer.  We discussed that it may take some  time for her Todd's paralysis to fully resolve but this will be a gradual process potentially over several days.  Lesleigh Noe MD-PhD [facility] [phone]

## 2020-10-30 ENCOUNTER — Other Ambulatory Visit: Payer: Self-pay

## 2020-10-30 ENCOUNTER — Inpatient Hospital Stay: Payer: Medicare Other

## 2020-10-30 ENCOUNTER — Other Ambulatory Visit: Payer: Self-pay | Admitting: Internal Medicine

## 2020-10-30 ENCOUNTER — Inpatient Hospital Stay: Payer: Medicare Other | Attending: Hematology and Oncology

## 2020-10-30 VITALS — BP 152/76 | HR 60 | Temp 98.4°F | Resp 17 | Wt 149.5 lb

## 2020-10-30 DIAGNOSIS — Z5111 Encounter for antineoplastic chemotherapy: Secondary | ICD-10-CM | POA: Insufficient documentation

## 2020-10-30 DIAGNOSIS — Z9221 Personal history of antineoplastic chemotherapy: Secondary | ICD-10-CM | POA: Diagnosis not present

## 2020-10-30 DIAGNOSIS — C50412 Malignant neoplasm of upper-outer quadrant of left female breast: Secondary | ICD-10-CM | POA: Diagnosis not present

## 2020-10-30 DIAGNOSIS — Z170 Estrogen receptor positive status [ER+]: Secondary | ICD-10-CM

## 2020-10-30 DIAGNOSIS — I10 Essential (primary) hypertension: Secondary | ICD-10-CM | POA: Diagnosis not present

## 2020-10-30 DIAGNOSIS — Z79811 Long term (current) use of aromatase inhibitors: Secondary | ICD-10-CM | POA: Diagnosis not present

## 2020-10-30 DIAGNOSIS — T66XXXA Radiation sickness, unspecified, initial encounter: Secondary | ICD-10-CM | POA: Insufficient documentation

## 2020-10-30 DIAGNOSIS — I2510 Atherosclerotic heart disease of native coronary artery without angina pectoris: Secondary | ICD-10-CM | POA: Insufficient documentation

## 2020-10-30 DIAGNOSIS — Z803 Family history of malignant neoplasm of breast: Secondary | ICD-10-CM | POA: Diagnosis not present

## 2020-10-30 DIAGNOSIS — R197 Diarrhea, unspecified: Secondary | ICD-10-CM | POA: Insufficient documentation

## 2020-10-30 DIAGNOSIS — Z9012 Acquired absence of left breast and nipple: Secondary | ICD-10-CM | POA: Diagnosis not present

## 2020-10-30 DIAGNOSIS — C7951 Secondary malignant neoplasm of bone: Secondary | ICD-10-CM

## 2020-10-30 DIAGNOSIS — Z7952 Long term (current) use of systemic steroids: Secondary | ICD-10-CM | POA: Insufficient documentation

## 2020-10-30 DIAGNOSIS — D72819 Decreased white blood cell count, unspecified: Secondary | ICD-10-CM | POA: Insufficient documentation

## 2020-10-30 DIAGNOSIS — I252 Old myocardial infarction: Secondary | ICD-10-CM | POA: Insufficient documentation

## 2020-10-30 DIAGNOSIS — D696 Thrombocytopenia, unspecified: Secondary | ICD-10-CM | POA: Diagnosis not present

## 2020-10-30 DIAGNOSIS — Z79899 Other long term (current) drug therapy: Secondary | ICD-10-CM | POA: Insufficient documentation

## 2020-10-30 DIAGNOSIS — Z923 Personal history of irradiation: Secondary | ICD-10-CM | POA: Diagnosis not present

## 2020-10-30 DIAGNOSIS — E039 Hypothyroidism, unspecified: Secondary | ICD-10-CM | POA: Diagnosis not present

## 2020-10-30 DIAGNOSIS — Z95828 Presence of other vascular implants and grafts: Secondary | ICD-10-CM

## 2020-10-30 DIAGNOSIS — Z8041 Family history of malignant neoplasm of ovary: Secondary | ICD-10-CM | POA: Insufficient documentation

## 2020-10-30 DIAGNOSIS — C7931 Secondary malignant neoplasm of brain: Secondary | ICD-10-CM | POA: Insufficient documentation

## 2020-10-30 DIAGNOSIS — R531 Weakness: Secondary | ICD-10-CM | POA: Diagnosis not present

## 2020-10-30 DIAGNOSIS — C50919 Malignant neoplasm of unspecified site of unspecified female breast: Secondary | ICD-10-CM

## 2020-10-30 DIAGNOSIS — Z800 Family history of malignant neoplasm of digestive organs: Secondary | ICD-10-CM | POA: Diagnosis not present

## 2020-10-30 DIAGNOSIS — R569 Unspecified convulsions: Secondary | ICD-10-CM | POA: Insufficient documentation

## 2020-10-30 DIAGNOSIS — E785 Hyperlipidemia, unspecified: Secondary | ICD-10-CM | POA: Insufficient documentation

## 2020-10-30 DIAGNOSIS — D649 Anemia, unspecified: Secondary | ICD-10-CM | POA: Insufficient documentation

## 2020-10-30 LAB — CBC WITH DIFFERENTIAL (CANCER CENTER ONLY)
Abs Immature Granulocytes: 0.03 10*3/uL (ref 0.00–0.07)
Basophils Absolute: 0 10*3/uL (ref 0.0–0.1)
Basophils Relative: 0 %
Eosinophils Absolute: 0 10*3/uL (ref 0.0–0.5)
Eosinophils Relative: 0 %
HCT: 31.5 % — ABNORMAL LOW (ref 36.0–46.0)
Hemoglobin: 10.7 g/dL — ABNORMAL LOW (ref 12.0–15.0)
Immature Granulocytes: 1 %
Lymphocytes Relative: 23 %
Lymphs Abs: 1.5 10*3/uL (ref 0.7–4.0)
MCH: 36.4 pg — ABNORMAL HIGH (ref 26.0–34.0)
MCHC: 34 g/dL (ref 30.0–36.0)
MCV: 107.1 fL — ABNORMAL HIGH (ref 80.0–100.0)
Monocytes Absolute: 0.5 10*3/uL (ref 0.1–1.0)
Monocytes Relative: 8 %
Neutro Abs: 4.4 10*3/uL (ref 1.7–7.7)
Neutrophils Relative %: 68 %
Platelet Count: 107 10*3/uL — ABNORMAL LOW (ref 150–400)
RBC: 2.94 MIL/uL — ABNORMAL LOW (ref 3.87–5.11)
RDW: 14.6 % (ref 11.5–15.5)
WBC Count: 6.5 10*3/uL (ref 4.0–10.5)
nRBC: 0 % (ref 0.0–0.2)

## 2020-10-30 LAB — CMP (CANCER CENTER ONLY)
ALT: 48 U/L — ABNORMAL HIGH (ref 0–44)
AST: 50 U/L — ABNORMAL HIGH (ref 15–41)
Albumin: 3.5 g/dL (ref 3.5–5.0)
Alkaline Phosphatase: 157 U/L — ABNORMAL HIGH (ref 38–126)
Anion gap: 9 (ref 5–15)
BUN: 30 mg/dL — ABNORMAL HIGH (ref 8–23)
CO2: 26 mmol/L (ref 22–32)
Calcium: 9.6 mg/dL (ref 8.9–10.3)
Chloride: 104 mmol/L (ref 98–111)
Creatinine: 0.95 mg/dL (ref 0.44–1.00)
GFR, Estimated: >60 mL/min (ref >60)
Glucose, Bld: 91 mg/dL (ref 70–99)
Potassium: 3.8 mmol/L (ref 3.5–5.1)
Sodium: 139 mmol/L (ref 135–145)
Total Bilirubin: 0.6 mg/dL (ref 0.3–1.2)
Total Protein: 6.8 g/dL (ref 6.5–8.1)

## 2020-10-30 MED ORDER — DIPHENHYDRAMINE HCL 25 MG PO CAPS
50.0000 mg | ORAL_CAPSULE | Freq: Once | ORAL | Status: AC
  Administered 2020-10-30: 50 mg via ORAL

## 2020-10-30 MED ORDER — HEPARIN SOD (PORK) LOCK FLUSH 100 UNIT/ML IV SOLN
500.0000 [IU] | Freq: Once | INTRAVENOUS | Status: AC | PRN
  Administered 2020-10-30: 500 [IU]
  Filled 2020-10-30: qty 5

## 2020-10-30 MED ORDER — SODIUM CHLORIDE 0.9% FLUSH
10.0000 mL | Freq: Once | INTRAVENOUS | Status: AC
  Administered 2020-10-30: 10 mL
  Filled 2020-10-30: qty 10

## 2020-10-30 MED ORDER — ACETAMINOPHEN 325 MG PO TABS
ORAL_TABLET | ORAL | Status: AC
  Filled 2020-10-30: qty 2

## 2020-10-30 MED ORDER — SODIUM CHLORIDE 0.9% FLUSH
10.0000 mL | INTRAVENOUS | Status: DC | PRN
  Administered 2020-10-30: 10 mL
  Filled 2020-10-30: qty 10

## 2020-10-30 MED ORDER — PREDNISONE 50 MG PO TABS: ORAL_TABLET | ORAL | 0 refills | Status: DC

## 2020-10-30 MED ORDER — SODIUM CHLORIDE 0.9 % IV SOLN
Freq: Once | INTRAVENOUS | Status: AC
  Filled 2020-10-30: qty 250

## 2020-10-30 MED ORDER — DIPHENHYDRAMINE HCL 25 MG PO CAPS
ORAL_CAPSULE | ORAL | Status: AC
  Filled 2020-10-30: qty 2

## 2020-10-30 MED ORDER — TRASTUZUMAB-DKST CHEMO 150 MG IV SOLR
6.0000 mg/kg | Freq: Once | INTRAVENOUS | Status: AC
  Administered 2020-10-30: 399 mg via INTRAVENOUS
  Filled 2020-10-30: qty 19

## 2020-10-30 MED ORDER — DEXAMETHASONE 2 MG PO TABS: 4.0000 mg | ORAL_TABLET | Freq: Every day | ORAL | 0 refills | Status: DC

## 2020-10-30 MED ORDER — ACETAMINOPHEN 325 MG PO TABS
650.0000 mg | ORAL_TABLET | Freq: Once | ORAL | Status: AC
  Administered 2020-10-30: 650 mg via ORAL

## 2020-10-30 NOTE — Progress Notes (Signed)
Okay to treat with Echo from 03/2020 per MD - Orders placed for scheduling echo for future treatments.

## 2020-10-30 NOTE — Progress Notes (Signed)
Per Dr. Lindi Adie, okay to treat with ECHO from 03/2020.  Next ECHO will be scheduled by RN.

## 2020-10-30 NOTE — Patient Instructions (Signed)
[facility] Discharge Instructions for Patients Receiving Chemotherapy  Today you received the following chemotherapy agents trastuzumab-dkst   To help prevent nausea and vomiting after your treatment, we encourage you to take your nausea medication as directed.   If you develop nausea and vomiting that is not controlled by your nausea medication, call the clinic.   BELOW ARE SYMPTOMS THAT SHOULD BE REPORTED IMMEDIATELY:  *FEVER GREATER THAN 100.5 F  *CHILLS WITH OR WITHOUT FEVER  NAUSEA AND VOMITING THAT IS NOT CONTROLLED WITH YOUR NAUSEA MEDICATION  *UNUSUAL SHORTNESS OF BREATH  *UNUSUAL BRUISING OR BLEEDING  TENDERNESS IN MOUTH AND THROAT WITH OR WITHOUT PRESENCE OF ULCERS  *URINARY PROBLEMS  *BOWEL PROBLEMS  UNUSUAL RASH Items with * indicate a potential emergency and should be followed up as soon as possible.  Feel free to call the clinic should you have any questions or concerns. The clinic phone number is [phone].  Please show the CHEMO ALERT CARD at check-in to the Emergency Department and triage nurse.   

## 2020-10-30 NOTE — Progress Notes (Signed)
Echocardiogram orders placed.  

## 2020-11-03 ENCOUNTER — Other Ambulatory Visit: Payer: Self-pay | Admitting: Hematology and Oncology

## 2020-11-03 ENCOUNTER — Telehealth: Payer: Self-pay

## 2020-11-03 DIAGNOSIS — Z170 Estrogen receptor positive status [ER+]: Secondary | ICD-10-CM

## 2020-11-03 DIAGNOSIS — C50412 Malignant neoplasm of upper-outer quadrant of left female breast: Secondary | ICD-10-CM

## 2020-11-03 NOTE — Telephone Encounter (Signed)
Called to confirm with the Patient that she received her 4 mg of Decadron. Pt states that she is taking the 4 mg of Decadron and is feeling much better.

## 2020-11-03 NOTE — Telephone Encounter (Signed)
-----   Message from Ventura Sellers, MD sent at 10/30/2020  4:01 PM EST ----- Regarding: RE: [place].  I send in script for 4mg  daily decadron instead.  We can adjust further during her visit after the MRI. ----- Message ----- From: Burna Mortimer, CMA Sent: 10/30/2020   1:41 PM EST To: Ventura Sellers, MD Subject: RE: Decadron                                   Patient reports that she was given 6 mg of Decadron in the hospital but they did not give her any to take home upon discharge.  Patient confirms that she did not have the MRI done at that location and still plans on having it completed on 11/06/20.  ----- Message ----- From: Ventura Sellers, MD Sent: 10/30/2020   1:08 PM EST To: Burna Mortimer, CMA Subject: RE: Decadron                                   She should take the dose she was discharged on, we can adjust it in the clinic.    She is scheduled for MRI on 11/06/20, but if this was done in the hospital, we should instead get those records (and images uploaded to PACS) from [facility].  I know CT was done but not sure of MRI.     ----- Message ----- From: Burna Mortimer, CMA Sent: 10/30/2020  11:00 AM EST To: Ventura Sellers, MD Subject: Decadron                                       Dr. Mickeal Skinner,  The patient called wanting to know if you could clarify the correct dosage of Decadron that she should be taking currently. The patient states that during her recent admission her decadron was increased to 6 mg but you had her taking 1 mg and 2 mg in the recent past. Please advise.  Thanks, Myriam Jacobson

## 2020-11-06 ENCOUNTER — Ambulatory Visit (HOSPITAL_COMMUNITY)
Admission: RE | Admit: 2020-11-06 | Discharge: 2020-11-06 | Disposition: A | Discharge status: Home / Self Care | Payer: Medicare Other | Source: Ambulatory Visit | Attending: Internal Medicine | Admitting: Internal Medicine

## 2020-11-06 ENCOUNTER — Other Ambulatory Visit: Payer: Self-pay

## 2020-11-06 ENCOUNTER — Ambulatory Visit (HOSPITAL_COMMUNITY): Payer: Medicare Other

## 2020-11-06 DIAGNOSIS — C7931 Secondary malignant neoplasm of brain: Secondary | ICD-10-CM | POA: Diagnosis present

## 2020-11-06 MED ORDER — GADOBUTROL 1 MMOL/ML IV SOLN
6.0000 mL | Freq: Once | INTRAVENOUS | Status: AC | PRN
  Administered 2020-11-06: 6 mL via INTRAVENOUS

## 2020-11-06 MED ORDER — HEPARIN SOD (PORK) LOCK FLUSH 100 UNIT/ML IV SOLN
500.0000 [IU] | INTRAVENOUS | Status: AC | PRN
  Administered 2020-11-06: 500 [IU]
  Filled 2020-11-06: qty 5

## 2020-11-08 NOTE — Progress Notes (Signed)
Patient Care Team: [name], MD as PCP - General (Family Medicine) Minus Breeding, MD as PCP - Cardiology (Cardiology) Nicholas Lose, MD as Consulting Physician (Hematology and Oncology)  DIAGNOSIS:    ICD-10-CM   1. Metastatic breast cancer ([place])  C50.919     SUMMARY OF ONCOLOGIC HISTORY: Oncology History  Breast cancer of upper-outer quadrant of left female breast ([place])  11/14/2011 Surgery   Bilateral mastectomy, prophylactic on the right, left breast IDC 3/18 lymph nodes positive with extracapsular extension ER 89%, PR 81%, HER-2 negative, Ki-67 79% T2 N1 A. stage IIB   12/13/2011 - 06/28/2012 Chemotherapy   4 cycles of FEC followed by 4 cycles of Taxotere   07/17/2012 - 08/22/2012 Radiation Therapy   Adjuvant radiation therapy   08/22/2012 - 03/16/2016 Anti-estrogen oral therapy   Arimidex 1 mg daily   03/16/2016 Relapse/Recurrence   Subcutaneous nodule excision left chest: Infiltrating carcinoma breast primary, ER positive, PR negative   03/29/2016 Imaging   CT CAP and bone scan: Lytic lesions T8 vertebral, T1 posterior element, subcutaneous nodule left lateral chest wall, nonspecific lung nodules; Bone scan: Mets to kull, left humerus, left eighth rib, T7/T8, sternum, left acetabulum   04/28/2016 - 06/17/2017 Chemotherapy   Herceptin, lapatinib, Faslodex, Zometa every 4 weeks, lapatinib discontinued in September 2018 due to elevation of LFTs   06/22/2017 Relapse/Recurrence   Surgical excision:Soft tissue mass left lateral chest wall primary breast cancer, soft tissue mass left medial chest wall breast cancer, tumor is within the dermis extending to the subcutaneous adipose tissue and involves portions of skeletal muscle   06/22/2017 Cancer Staging   Staging form: Breast, AJCC 7th Edition - Pathologic stage from 06/22/2017: Stage IV (TX, NX, M1) - Signed by Nicholas Lose, MD on 12/27/2019   08/2017 - 02/16/2018 Chemotherapy   Faslodex with Herceptin and Perjeta along with Zometa  every 4 weeks   03/02/2018 - 05/25/2018 Chemotherapy   Kadcyla   05/11/2018 Imaging   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 Associated vasogenic edema within the underlying right cerebral hemisphere without significant midline shift. Signal abnormality throughout the visualized bone marrow, compatible with osseous metastatic disease.    06/04/2018 Imaging   CT CAP: Right lower lobe lung nodule 7 mm (was 5 mm); multiple bone metastases throughout the spine and ribs sternum scapula and humerus, slightly increased lower thoracic mets, right renal lesion 2.5 cm (was 1.2 cm) right femur met increased from 2.1 cm to 2.9 cm   06/15/2018 -  Anti-estrogen oral therapy   Abemaciclib, Herceptin, letrozole, Xgeva   06/07/2019 Imaging   Progression of brain metastases,2 discrete dural-based lesions involving the posterior right frontal lobe. The more anterior lesion now measures 16.5 x 17.5 x 14 mm. The posterior lesion now measures 9.5 x 13 x 20 mm.  Vasogenic edema of the frontal and parietal lobes   06/21/2019 - 06/27/2019 Radiation Therapy   SRS to the brain   07/19/2020 Relapse/Recurrence   Left mastectomy scar nodule: Biopsy invasive carcinoma ER 60%, PR 0%, HER-2 negative by [facility]   07/29/2020 Surgery   Soft tissue mass left chest wall excision: No evidence of malignancy.   Metastatic breast cancer ([place])  06/29/2017 Initial Diagnosis   Metastatic breast cancer ([place])   06/15/2018 -  Chemotherapy      Patient is on Antibody Plan: BREAST TRASTUZUMAB Q21D      CHIEF COMPLIANT: Recent emergency room visit for seizures  INTERVAL HISTORY: [name] Riley is a 70 y.o. with above-mentioned  history of metastatic breast cancer currently on abemaciclib with letrozole along with HerceptinandXgeva. She was in the emergency room recently with seizures.  She was kept in observation and was discharged home.  She did not have any seizures subsequently.  Brain MRI done couple days  ago revealed slight progression of the CNS lesions suspicious for radiation necrosis along with intracranial edema.  She is unable to tolerate dexamethasone at higher doses.  She met with Dr. Mickeal Skinner today who recommended adding Avastin to her Herceptin treatments.  ALLERGIES:  is allergic to cantaloupe extract allergy skin test, contrast media [iodinated diagnostic agents], pravastatin, zosyn [piperacillin sod-tazobactam so], and latex.  MEDICATIONS:  Current Outpatient Medications  Medication Sig Dispense Refill  . acetaminophen (TYLENOL) 500 MG tablet Take 1,000 mg by mouth every 6 (six) hours as needed for moderate pain.    . Ascorbic Acid (VITAMIN C PO) Take by mouth.    . cholecalciferol (VITAMIN D) 1000 units tablet Take 1 tablet (1,000 Units total) by mouth daily.    Marland Kitchen dexamethasone (DECADRON) 2 MG tablet Take 2 tablets (4 mg total) by mouth daily. 60 tablet 0  . Docusate Sodium (DSS) 100 MG CAPS Take by mouth.    . letrozole (FEMARA) 2.5 MG tablet TAKE ONE (1) TABLET EACH DAY 90 tablet 3  . levETIRAcetam (KEPPRA) 500 MG tablet Take 1 tablet (500 mg total) by mouth 2 (two) times daily. 60 tablet 3  . levothyroxine (SYNTHROID) 100 MCG tablet TAKE ONE TABLET EACH MORNING BEFORE BREAKFAST 90 tablet 0  . lidocaine-prilocaine (EMLA) cream APPLY TOPICALLY AS NEEDED FOR PORT ACCESS 30 g 3  . metoprolol succinate (TOPROL-XL) 100 MG 24 hr tablet TAKE ONE (1) TABLET EACH DAY 90 tablet 0  . Multiple Vitamins-Minerals (MULTIVITAMIN WITH MINERALS) tablet Take 1 tablet by mouth daily.    . ondansetron (ZOFRAN-ODT) 4 MG disintegrating tablet TAKE 1 TABLET EVERY 6 HOURS AS NEEDED FOR NAUSEA AND VOMITING 30 tablet 3  . predniSONE (DELTASONE) 50 MG tablet Prednisone 70m PO 13 hours, 7 hours, and 1 hour before CT scan. 3 tablet 0  . rosuvastatin (CRESTOR) 5 MG tablet Take 1 tablet by mouth 2 (two) times daily.     .Marland Kitchentriamcinolone (KENALOG) 0.025 % ointment Apply 1 application topically 2 (two) times daily.  80 g 1  . valsartan (DIOVAN) 80 MG tablet TAKE ONE (1) TABLET EACH DAY 90 tablet 1  . VERZENIO 100 MG tablet TAKE 1 TABLET (100 MG TOTAL) BY MOUTH 2 (TWO) TIMES DAILY. SWALLOW TABLETS WHOLE. DO NOT CHEW, CRUSH, OR SPLIT TABLETS BEFORE SWALLOWING. 56 tablet 3   No current facility-administered medications for this visit.    PHYSICAL EXAMINATION: ECOG PERFORMANCE STATUS: 1 - Symptomatic but completely ambulatory  There were no vitals filed for this visit. There were no vitals filed for this visit.  LABORATORY DATA:  I have reviewed the data as listed CMP Latest Ref Rng & Units 10/30/2020 10/02/2020 09/04/2020  Glucose 70 - 99 mg/dL 91 116(H) 93  BUN 8 - 23 mg/dL 30(H) 18 15  Creatinine 0.44 - 1.00 mg/dL 0.95 0.92 0.84  Sodium 135 - 145 mmol/L 139 140 139  Potassium 3.5 - 5.1 mmol/L 3.8 3.6 3.7  Chloride 98 - 111 mmol/L 104 104 104  CO2 22 - 32 mmol/L 26 28 28   Calcium 8.9 - 10.3 mg/dL 9.6 9.7 9.3  Total Protein 6.5 - 8.1 g/dL 6.8 6.8 6.7  Total Bilirubin 0.3 - 1.2 mg/dL 0.6 0.5 0.5  Alkaline  Phos 38 - 126 U/L 157(H) 176(H) 161(H)  AST 15 - 41 U/L 50(H) 48(H) 40  ALT 0 - 44 U/L 48(H) 37 21    Lab Results  Component Value Date   WBC 6.5 10/30/2020   HGB 10.7 (L) 10/30/2020   HCT 31.5 (L) 10/30/2020   MCV 107.1 (H) 10/30/2020   PLT 107 (L) 10/30/2020   NEUTROABS 4.4 10/30/2020    ASSESSMENT & PLAN:  Metastatic breast cancer ([place]) Current treatment: Abemaciclibwith letrozole along with Herceptin(to be given every 4 weeks)and Xgeva started 06/15/2018  Toxicities:Cytopenias Thrombocytopenia: Monitoring closely,platelet count107 Anemia: Today's hemoglobin is10.7 Leukopenia: Today's ANC4.4 Diarrhea is under good control with a lower dosage of abemaciclib.  CT CAP 05/25/2020: No significant interval change in the bone metastases. 07/29/2020: Chest wall recurrence: Biopsy did have invasive carcinoma that was ER 60% PR 0% HER-2 negative, final excision did not show any  further evidence of breast cancer.  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 Mickeal Skinner is adding Avastin to the treatment. She has scans scheduled for 11/26/2020.  Scans scheduled for 11/26/2020 I will see her on 11/27/2018 to discuss results.    No orders of the defined types were placed in this encounter.  The patient has a good understanding of the overall plan. she agrees with it. she will call with any problems that may develop before the next visit here.  Total time spent: 30 mins including face to face time and time spent for planning, charting and coordination of care  Nicholas Lose, MD 11/09/2020  I, Cloyde Reams Dorshimer, am acting as scribe for Dr. Nicholas Lose.  I have reviewed the above documentation for accuracy and completeness, and I agree with the above.

## 2020-11-09 ENCOUNTER — Other Ambulatory Visit: Payer: Self-pay

## 2020-11-09 ENCOUNTER — Inpatient Hospital Stay (HOSPITAL_BASED_OUTPATIENT_CLINIC_OR_DEPARTMENT_OTHER): Payer: Medicare Other | Admitting: Internal Medicine

## 2020-11-09 ENCOUNTER — Inpatient Hospital Stay: Payer: Medicare Other

## 2020-11-09 ENCOUNTER — Inpatient Hospital Stay (HOSPITAL_BASED_OUTPATIENT_CLINIC_OR_DEPARTMENT_OTHER): Payer: Medicare Other | Admitting: Hematology and Oncology

## 2020-11-09 VITALS — BP 138/75 | HR 71 | Temp 97.7°F | Resp 17 | Ht 68.0 in | Wt 154.5 lb

## 2020-11-09 DIAGNOSIS — Z5111 Encounter for antineoplastic chemotherapy: Secondary | ICD-10-CM | POA: Diagnosis not present

## 2020-11-09 DIAGNOSIS — C7931 Secondary malignant neoplasm of brain: Secondary | ICD-10-CM | POA: Diagnosis not present

## 2020-11-09 DIAGNOSIS — C50919 Malignant neoplasm of unspecified site of unspecified female breast: Secondary | ICD-10-CM

## 2020-11-09 MED ORDER — LEVETIRACETAM 500 MG PO TABS: 500.0000 mg | ORAL_TABLET | Freq: Two times a day (BID) | ORAL | 3 refills | Status: DC

## 2020-11-09 MED FILL — VERZENIO 100 MG TAB: 100 | 28 days supply | Qty: 56 | Fill #0

## 2020-11-09 NOTE — Progress Notes (Signed)
[facility] [place], [place] [address] [phone]   Interval Evaluation  Date of Service: 11/09/20 Patient Name: [name] Riley Patient MRN: [id number] Patient DOB: [birth year] Provider: Ventura Sellers, MD  Identifying Statement:  [name] Riley is a 70 y.o. female with seizures, brain metastases  Primary Cancer:  Oncologic History: Oncology History  Breast cancer of upper-outer quadrant of left female breast ([place])  11/14/2011 Surgery   Bilateral mastectomy, prophylactic on the right, left breast IDC 3/18 lymph nodes positive with extracapsular extension ER 89%, PR 81%, HER-2 negative, Ki-67 79% T2 N1 A. stage IIB   12/13/2011 - 06/28/2012 Chemotherapy   4 cycles of FEC followed by 4 cycles of Taxotere   07/17/2012 - 08/22/2012 Radiation Therapy   Adjuvant radiation therapy   08/22/2012 - 03/16/2016 Anti-estrogen oral therapy   Arimidex 1 mg daily   03/16/2016 Relapse/Recurrence   Subcutaneous nodule excision left chest: Infiltrating carcinoma breast primary, ER positive, PR negative   03/29/2016 Imaging   CT CAP and bone scan: Lytic lesions T8 vertebral, T1 posterior element, subcutaneous nodule left lateral chest wall, nonspecific lung nodules; Bone scan: Mets to kull, left humerus, left eighth rib, T7/T8, sternum, left acetabulum   04/28/2016 - 06/17/2017 Chemotherapy   Herceptin, lapatinib, Faslodex, Zometa every 4 weeks, lapatinib discontinued in September 2018 due to elevation of LFTs   06/22/2017 Relapse/Recurrence   Surgical excision:Soft tissue mass left lateral chest wall primary breast cancer, soft tissue mass left medial chest wall breast cancer, tumor is within the dermis extending to the subcutaneous adipose tissue and involves portions of skeletal muscle   06/22/2017 Cancer Staging   Staging form: Breast, AJCC 7th Edition - Pathologic stage from 06/22/2017: Stage IV (TX, NX, M1) - Signed by Nicholas Lose, MD on  12/27/2019   08/2017 - 02/16/2018 Chemotherapy   Faslodex with Herceptin and Perjeta along with Zometa every 4 weeks   03/02/2018 - 05/25/2018 Chemotherapy   Kadcyla   05/11/2018 Imaging   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 Associated vasogenic edema within the underlying right cerebral hemisphere without significant midline shift. Signal abnormality throughout the visualized bone marrow, compatible with osseous metastatic disease.    06/04/2018 Imaging   CT CAP: Right lower lobe lung nodule 7 mm (was 5 mm); multiple bone metastases throughout the spine and ribs sternum scapula and humerus, slightly increased lower thoracic mets, right renal lesion 2.5 cm (was 1.2 cm) right femur met increased from 2.1 cm to 2.9 cm   06/15/2018 -  Anti-estrogen oral therapy   Abemaciclib, Herceptin, letrozole, Xgeva   06/07/2019 Imaging   Progression of brain metastases,2 discrete dural-based lesions involving the posterior right frontal lobe. The more anterior lesion now measures 16.5 x 17.5 x 14 mm. The posterior lesion now measures 9.5 x 13 x 20 mm.  Vasogenic edema of the frontal and parietal lobes   06/21/2019 - 06/27/2019 Radiation Therapy   SRS to the brain   07/19/2020 Relapse/Recurrence   Left mastectomy scar nodule: Biopsy invasive carcinoma ER 60%, PR 0%, HER-2 negative by [facility]   07/29/2020 Surgery   Soft tissue mass left chest wall excision: No evidence of malignancy.   Metastatic breast cancer ([place])  06/29/2017 Initial Diagnosis   Metastatic breast cancer ([place])   06/15/2018 -  Chemotherapy      Patient is on Antibody Plan: BREAST TRASTUZUMAB Q21D      Interval History:  [name] Riley presents today  after recent MRI brain.  Unfortunately she was hospitalized at [facility] starting on 10/28/20 after present with seizure activity, consisting of acute onset of confusion and right sided shaking.  She describes several days of worsening left sided weakness after  the seizure, but this has improved considerably in recent days with the steroids.  She is currently dosing decadron 57m daily (previously was 136mdaily).  No additional headaches, continues on systemic therapy with Dr. GuLindi Adie Dexamethasone  09/03/20: 39m65m1/30/21: 1mg41m/12/22: 18mg67m14/22: 4mg  35mications: Current Outpatient Medications on File Prior to Visit  Medication Sig Dispense Refill  . acetaminophen (TYLENOL) 500 MG tablet Take 1,000 mg by mouth every 6 (six) hours as needed for moderate pain.    . Ascorbic Acid (VITAMIN C PO) Take by mouth.    . cholecalciferol (VITAMIN D) 1000 units tablet Take 1 tablet (1,000 Units total) by mouth daily.    . dexaMarland Kitchenethasone (DECADRON) 2 MG tablet Take 2 tablets (4 mg total) by mouth daily. 60 tablet 0  . Docusate Sodium (DSS) 100 MG CAPS Take by mouth.    . letrozole (FEMARA) 2.5 MG tablet TAKE ONE (1) TABLET EACH DAY 90 tablet 3  . levothyroxine (SYNTHROID) 100 MCG tablet TAKE ONE TABLET EACH MORNING BEFORE BREAKFAST 90 tablet 0  . lidocaine-prilocaine (EMLA) cream APPLY TOPICALLY AS NEEDED FOR PORT ACCESS 30 g 3  . metoprolol succinate (TOPROL-XL) 100 MG 24 hr tablet TAKE ONE (1) TABLET EACH DAY 90 tablet 0  . Multiple Vitamins-Minerals (MULTIVITAMIN WITH MINERALS) tablet Take 1 tablet by mouth daily.    . ondansetron (ZOFRAN-ODT) 4 MG disintegrating tablet TAKE 1 TABLET EVERY 6 HOURS AS NEEDED FOR NAUSEA AND VOMITING 30 tablet 3  . predniSONE (DELTASONE) 50 MG tablet Prednisone 50mg P35m hours, 7 hours, and 1 hour before CT scan. 3 tablet 0  . rosuvastatin (CRESTOR) 5 MG tablet Take 1 tablet by mouth 2 (two) times daily.     . triamMarland Kitcheninolone (KENALOG) 0.025 % ointment Apply 1 application topically 2 (two) times daily. 80 g 1  . valsartan (DIOVAN) 80 MG tablet TAKE ONE (1) TABLET EACH DAY 90 tablet 1  . VERZENIO 100 MG tablet TAKE 1 TABLET (100 MG TOTAL) BY MOUTH 2 (TWO) TIMES DAILY. SWALLOW TABLETS WHOLE. DO NOT CHEW, CRUSH, OR SPLIT  TABLETS BEFORE SWALLOWING. 56 tablet 3   No current facility-administered medications on file prior to visit.    Allergies:  Allergies  Allergen Reactions  . Cantaloupe Extract Allergy Skin Test Shortness Of Breath  . Contrast Media [Iodinated Diagnostic Agents] Shortness Of Breath and Rash  . Pravastatin Other (See Comments)    Legs hurt  . Zosyn [Piperacillin Sod-Tazobactam So] Rash and Other (See Comments)    Temperature increase, facial flushing  . Latex Rash    Redness, itch    Past Medical History:  Past Medical History:  Diagnosis Date  . Allergy   . Breast cancer (HCC) 11[place]2012   L , invasive ductal carcinoma, ER/PR +,HER2 -  . [facility]  . Coronary artery disease 2001  . Heart [facility] (HCC) 1[facility]   Sep 25, 2000  --no intervention  . History of chemotherapy comp. 08/22/2012   4 cycles of FEC and $ cycles of Taxotere  . Hyperlipidemia   . Hypertension   . Hypothyroidism   . PONV (postoperative nausea and vomiting)    gets sick from anesthesia  . Status post radiation therapy 07/09/12 -  08/22/2012   Left Breast, 60.4 gray   Past Surgical History:  Past Surgical History:  Procedure Laterality Date  . ABDOMINAL HYSTERECTOMY  1998   TAH, oophorectomy  . APPENDECTOMY  1970  . BREAST CYST EXCISION Left 07/29/2020   Procedure: WIDE EXCISION OF LEFT MASTECTOMY INCISION;  Surgeon: Donnie Mesa, MD;  Location: [facility];  Service: General;  Laterality: Left;  . BREAST SURGERY  1998   removal of benign lump in rt breast  . BREAST SURGERY  11/14/11   right simple mastectomy, left mrm  . INCISION AND DRAINAGE OF WOUND Left 07/01/2017   Procedure: IRRIGATION AND DEBRIDEMENT CHEST WALL ABSCESS;  Surgeon: Donnie Mesa, MD;  Location: [facility];  Service: General;  Laterality: Left;  Marland Kitchen MASS EXCISION Left 06/22/2017   Procedure: EXCISION OF CHEST WALL MASSES;  Surgeon: Donnie Mesa, MD;  Location: [facility];  Service: General;   Laterality: Left;  Marland Kitchen MASTECTOMY Bilateral    for left breast cancer  . OVARIAN CYST SURGERY Right 1970  . PORT-A-CATH REMOVAL  08/30/2012   Procedure: REMOVAL PORT-A-CATH;  Surgeon: Imogene Burn. [name], MD;  Location: [facility];  Service: General;  Laterality: Right;  port removal  . PORTACATH PLACEMENT  11/14/2011   Procedure: INSERTION PORT-A-CATH;  Surgeon: Imogene Burn. [name], MD;  Location: [facility];  Service: General;  Laterality: Right;  . PORTACATH PLACEMENT N/A 04/25/2016   Procedure: INSERTION PORT-A-CATH LEFT CHEST;  Surgeon: Donnie Mesa, MD;  Location: [facility];  Service: General;  Laterality: N/A;  . skin tags  05/09/1997   left axillary left neck skin tags  . TONSILLECTOMY  1968   Social History:  Social History   Socioeconomic History  . Marital status: Married    Spouse name: Not on file  . Number of children: 3  . Years of education: Not on file  . Highest education level: High school graduate  Occupational History  . Occupation: Works at [employer]  Tobacco Use  . Smoking status: Never Smoker  . Smokeless tobacco: Never Used  Vaping Use  . Vaping Use: Never used  Substance and Sexual Activity  . Alcohol use: No  . Drug use: No  . Sexual activity: Yes    Birth control/protection: Surgical    Comment: menarche 43, Parity age 44, G57, P3, 1 miscarriage,  HRT x 5-10 yrs, Mild Hot Flashes  Other Topics Concern  . Not on file  Social History Narrative   Lives at home.   Social Determinants of Health   Financial Resource Strain: Not on file  Food Insecurity: Not on file  Transportation Needs: Not on file  Physical Activity: Not on file  Stress: Not on file  Social Connections: Not on file  Intimate Partner Violence: Not on file   Family History:  Family History  Problem Relation Age of Onset  . Hypertension Maternal Grandmother   . Diabetes Maternal Grandmother   . Cancer Father 25       lung cancer and Prostate Cancer   . Hypertension Mother   . Cancer Paternal Aunt        ovarian  . Cancer Cousin        breast, paternal cousin  . Cancer Paternal Uncle        stomach  . Cancer Paternal Grandfather        Esophagus  . Colon cancer Neg Hx     Review of Systems: Constitutional: Denies fevers, chills or abnormal  weight loss Eyes: Denies blurriness of vision Ears, nose, mouth, throat, and face: Denies mucositis or sore throat Respiratory: Denies cough, dyspnea or wheezes Cardiovascular: Denies palpitation, chest discomfort or lower extremity swelling Gastrointestinal:  Denies nausea, constipation, diarrhea GU: Denies dysuria or incontinence Skin: Denies abnormal skin rashes Neurological: Per HPI Musculoskeletal: Denies joint pain, back or neck discomfort. No decrease in ROM Behavioral/Psych: Denies anxiety, disturbance in thought content, and mood instability   Physical Exam: Vitals:   11/09/20 0902  BP: 138/75  Pulse: 71  Resp: 17  Temp: 97.7 F (36.5 C)  SpO2: 100%   KPS: 80. General: Alert, cooperative, pleasant, in no acute distress Head: Normal EENT: No conjunctival injection or scleral icterus. Oral mucosa moist Lungs: Resp effort normal Cardiac: Regular rate and rhythm Abdomen: Soft, non-distended abdomen Skin: No rashes cyanosis or petechiae. Extremities: No clubbing or edema  Neurologic Exam: Mental Status: Awake, alert, attentive to examiner. Oriented to self and environment. Language is fluent with intact comprehension.  Cranial Nerves: Visual acuity is grossly normal. Visual fields are full. Extra-ocular movements intact. No ptosis. Face is symmetric, tongue midline. Motor: Tone and bulk are normal. Arms and legs 5/5. Reflexes are symmetric, no pathologic reflexes present. Intact finger to nose bilaterally Sensory: Normal Gait: Normal and tandem gait is deferred   Labs: I have reviewed the data as listed    Component Value Date/Time   NA 139 10/30/2020 1350   NA  141 09/02/2020 0806   NA 140 09/22/2017 0958   K 3.8 10/30/2020 1350   K 3.7 09/22/2017 0958   CL 104 10/30/2020 1350   CL 105 08/10/2012 0908   CO2 26 10/30/2020 1350   CO2 26 09/22/2017 0958   GLUCOSE 91 10/30/2020 1350   GLUCOSE 80 09/22/2017 0958   GLUCOSE 81 08/10/2012 0908   BUN 30 (H) 10/30/2020 1350   BUN 16 09/02/2020 0806   BUN 12.8 09/22/2017 0958   CREATININE 0.95 10/30/2020 1350   CREATININE 0.6 09/22/2017 0958   CALCIUM 9.6 10/30/2020 1350   CALCIUM 9.1 09/22/2017 0958   PROT 6.8 10/30/2020 1350   PROT 6.3 09/02/2020 0806   PROT 7.0 09/22/2017 0958   ALBUMIN 3.5 10/30/2020 1350   ALBUMIN 4.1 09/02/2020 0806   ALBUMIN 4.0 09/22/2017 0958   AST 50 (H) 10/30/2020 1350   AST 37 (H) 09/22/2017 0958   ALT 48 (H) 10/30/2020 1350   ALT 31 09/22/2017 0958   ALKPHOS 157 (H) 10/30/2020 1350   ALKPHOS 140 09/22/2017 0958   BILITOT 0.6 10/30/2020 1350   BILITOT 0.41 09/22/2017 0958   GFRNONAA >60 10/30/2020 1350   GFRAA 76 09/02/2020 0806   GFRAA >60 07/10/2020 0820   Lab Results  Component Value Date   WBC 6.5 10/30/2020   NEUTROABS 4.4 10/30/2020   HGB 10.7 (L) 10/30/2020   HCT 31.5 (L) 10/30/2020   MCV 107.1 (H) 10/30/2020   PLT 107 (L) 10/30/2020     Imaging:  [facility] Clinician Interpretation: I have personally reviewed the CNS images as listed.  My interpretation, in the context of the patient's clinical presentation, is treatment effect vs true progression  MR BRAIN W WO CONTRAST  Result Date: 11/06/2020 CLINICAL DATA:  Brain/CNS neoplasm.  Assess treatment response. EXAM: MRI HEAD WITHOUT AND WITH CONTRAST TECHNIQUE: Multiplanar, multiecho pulse sequences of the brain and surrounding structures were obtained without and with intravenous contrast. CONTRAST:  15m GADAVIST GADOBUTROL 1 MMOL/ML IV SOLN COMPARISON:  MRI of the brain August 24, 2020;  CT of the head October 28, 2020. FINDINGS: Brain: Diffuse pachymeningeal enhancement slightly less pronounced than  on prior MRI. However, there is interval increase in size focal areas focal nodularities, the largest at the level of the posterior aspect of the superior frontal gyrus measuring 21 x 13 mm (13 x 11 on prior) and the second at the level the right postcentral gyrus, measuring 12 x 12 mm (8 x 8 mm on prior). There is significant increase of the vasogenic edema in the subjacent brain parenchyma since prior MRI, involving most of the frontal parietal white matter in the centrum semiovale and subcortical white matter at the high convexity. This has not significantly changed from prior CT. No acute infarct, hemorrhage or hydrocephalus. Vascular: Normal flow voids. Skull and upper cervical spine: Unchanged appearance of sclerotic lesions within the visualized cervical spine, right mandibular condyle, clivus, right orbital rim and calvarium. Sinuses/Orbits: Small mucous retention cyst in the left maxillary sinus and trace mucosal thickening throughout the paranasal sinuses. Intraorbital contents are preserved. IMPRESSION: 1. Disease progression since prior MRI with increase in size of focal nodularities in the high convexity and significant increase of vasogenic edema in the subjacent brain parenchyma. 2. Unchanged appearance of bony metastatic disease. Electronically Signed   By: Pedro Earls M.D.   On: 11/06/2020 10:35      Assessment/Plan 1. Brain metastasis [facility])  [name] Riley presents today with recent breakthrough focal seizure secondary right frontal lobe dyfunction.  MRI demonstrates continuation of progression within two nodular foci in right frontal convexity, along with significantly increased degree of edema/T2 signal extending throughout much of right frontal lobe.  We suspect that diffuse pachymeningeal enhancement is reactive in nature rather than leptomeningeal due to lack of progression over several months.  These findings could still be consistent with either tumor progression or  radionecrosis.  Because of overall likelihood of radionecrosis, focal symptoms, refractoriness to corticosteroids (and also poor tolerance) we recommended course of avastin 31m/kg IV q2 weeks x4 cycles.  This is appropriate at this time because of her poor candidacy for surgery, radiation, and/or LITT.  Could also consider transition to [place]atinib through Dr. GLindi Adieif Avastin ineffective, or etiology is thought to be cancer rather than treatment related.      In the meantime recommend continuing the 461mdaily dose of decadron, until avastin is initiated.  Recommended increasing Keppra to 50045mID as well for more optimal seizure prevention.  We appreciate the opportunity to participate in the care of [name] Riley.    We will follow up with her shortly to initiate avastin and continue adjustment/titration of corticosteroids.    All questions were answered. The patient knows to call the clinic with any problems, questions or concerns. No barriers to learning were detected.  I have spent a total of 40 minutes of face-to-face and non-face-to-face time, excluding clinical staff time, preparing to see patient, ordering tests and/or medications, counseling the patient, and independently interpreting results and communicating results to the patient/family/caregiver    ZacVentura SellersD Medical Director of Neuro-Oncology [facility] [place]/24/22 4:20 PM

## 2020-11-09 NOTE — Assessment & Plan Note (Signed)
Current treatment: Abemaciclibwith letrozole along with Herceptin(to be given every 4 weeks)and Xgeva started 06/15/2018  Toxicities:Cytopenias Thrombocytopenia: Monitoring closely,platelet count102 Anemia: Today's hemoglobin is10 Leukopenia: Today's ANC1.2 Diarrhea is under good control with a lower dosage of abemaciclib.  CT CAP 05/25/2020: No significant interval change in the bone metastases. 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  I encouraged her tocontinueabemacicliband Herceptin with Xgeva..  Return to clinicmonthly for Herceptin and every other month with labs for follow-up to see me Scans scheduled for 11/26/2020

## 2020-11-10 ENCOUNTER — Ambulatory Visit: Payer: Medicare Other | Admitting: Internal Medicine

## 2020-11-11 ENCOUNTER — Other Ambulatory Visit: Payer: Self-pay | Admitting: Family Medicine

## 2020-11-11 DIAGNOSIS — E039 Hypothyroidism, unspecified: Secondary | ICD-10-CM

## 2020-11-13 ENCOUNTER — Other Ambulatory Visit: Payer: Self-pay | Admitting: Internal Medicine

## 2020-11-13 DIAGNOSIS — I6789 Other cerebrovascular disease: Secondary | ICD-10-CM | POA: Insufficient documentation

## 2020-11-13 DIAGNOSIS — Y842 Radiological procedure and radiotherapy as the cause of abnormal reaction of the patient, or of later complication, without mention of misadventure at the time of the procedure: Secondary | ICD-10-CM

## 2020-11-17 ENCOUNTER — Ambulatory Visit (HOSPITAL_COMMUNITY)
Admission: RE | Admit: 2020-11-17 | Discharge: 2020-11-17 | Disposition: A | Discharge status: Home / Self Care | Payer: Medicare Other | Source: Ambulatory Visit | Attending: Hematology and Oncology | Admitting: Hematology and Oncology

## 2020-11-17 ENCOUNTER — Other Ambulatory Visit: Payer: Self-pay

## 2020-11-17 DIAGNOSIS — Z9010 Acquired absence of unspecified breast and nipple: Secondary | ICD-10-CM | POA: Diagnosis not present

## 2020-11-17 DIAGNOSIS — I10 Essential (primary) hypertension: Secondary | ICD-10-CM | POA: Insufficient documentation

## 2020-11-17 DIAGNOSIS — Z01818 Encounter for other preprocedural examination: Secondary | ICD-10-CM | POA: Insufficient documentation

## 2020-11-17 DIAGNOSIS — C50412 Malignant neoplasm of upper-outer quadrant of left female breast: Secondary | ICD-10-CM | POA: Diagnosis not present

## 2020-11-17 DIAGNOSIS — Z0189 Encounter for other specified special examinations: Secondary | ICD-10-CM

## 2020-11-17 DIAGNOSIS — Z170 Estrogen receptor positive status [ER+]: Secondary | ICD-10-CM

## 2020-11-17 DIAGNOSIS — I351 Nonrheumatic aortic (valve) insufficiency: Secondary | ICD-10-CM | POA: Insufficient documentation

## 2020-11-17 DIAGNOSIS — E785 Hyperlipidemia, unspecified: Secondary | ICD-10-CM | POA: Insufficient documentation

## 2020-11-17 LAB — ECHOCARDIOGRAM COMPLETE
Area-P 1/2: 3.31 cm2
P 1/2 time: 345 msec
S' Lateral: 3.3 cm

## 2020-11-17 NOTE — Progress Notes (Signed)
  Echocardiogram 2D Echocardiogram has been performed.  [name] Riley 11/17/2020, 9:39 AM

## 2020-11-20 NOTE — Progress Notes (Signed)
Pharmacist Chemotherapy Monitoring - Initial Assessment    Anticipated start date: 11/27/20   Regimen:  . Are orders appropriate based on the patient's diagnosis, regimen, and cycle? Yes . Does the plan date match the patient's scheduled date? Yes . Is the sequencing of drugs appropriate? Yes . Are the premedications appropriate for the patient's regimen? Yes . Prior Authorization for treatment is: Approved o If applicable, is the correct biosimilar selected based on the patient's insurance? yes  Organ Function and Labs: Marland Kitchen Are dose adjustments needed based on the patient's renal function, hepatic function, or hematologic function? No . Are appropriate labs ordered prior to the start of patient's treatment? Yes . Other organ system assessment, if indicated: bevacizumab: baseline BP . The following baseline labs, if indicated, have been ordered: bevacizumab: urine protein  Dose Assessment: . Are the drug doses appropriate? Yes . Are the following correct: o Drug concentrations Yes o IV fluid compatible with drug Yes o Administration routes Yes o Timing of therapy Yes . If applicable, does the patient have documented access for treatment and/or plans for port-a-cath placement? yes . If applicable, have lifetime cumulative doses been properly documented and assessed? yes Lifetime Dose Tracking  . Epirubicin: 398.513 mg/m2 (792 mg) = 44.28 % of the maximum lifetime dose of 900 mg/m2  o   Toxicity Monitoring/Prevention: . The patient has the following take home antiemetics prescribed: N/A . The patient has the following take home medications prescribed: N/A . Medication allergies and previous infusion related reactions, if applicable, have been reviewed and addressed. Yes . The patient's current medication list has been assessed for drug-drug interactions with their chemotherapy regimen. no significant drug-drug interactions were identified on review.  Order Review: . Are the treatment  plan orders signed? Yes . Is the patient scheduled to see a provider prior to their treatment? Yes  I verify that I have reviewed each item in the above checklist and answered each question accordingly.  Kennith Center, Pharm.D., CPP 11/20/2020@11 :50 AM

## 2020-11-24 ENCOUNTER — Other Ambulatory Visit: Payer: Self-pay | Admitting: *Deleted

## 2020-11-24 ENCOUNTER — Telehealth: Payer: Self-pay | Admitting: *Deleted

## 2020-11-24 NOTE — Telephone Encounter (Signed)
RN placed call to pt to review 13 hour prep prior to CT contrast on 11/26/20.  Pt states she has prednisone 50 mg p.o tablets and benadryl 50 mg p.o tablets at home and verbalized understanding on administration instructions.

## 2020-11-26 ENCOUNTER — Inpatient Hospital Stay: Payer: Medicare Other | Attending: Hematology and Oncology

## 2020-11-26 ENCOUNTER — Encounter (HOSPITAL_COMMUNITY): Payer: Self-pay

## 2020-11-26 ENCOUNTER — Other Ambulatory Visit: Payer: Self-pay

## 2020-11-26 ENCOUNTER — Ambulatory Visit (HOSPITAL_COMMUNITY)
Admission: RE | Admit: 2020-11-26 | Discharge: 2020-11-26 | Disposition: A | Discharge status: Home / Self Care | Payer: Medicare Other | Source: Ambulatory Visit | Attending: Hematology and Oncology | Admitting: Hematology and Oncology

## 2020-11-26 DIAGNOSIS — C7931 Secondary malignant neoplasm of brain: Secondary | ICD-10-CM | POA: Insufficient documentation

## 2020-11-26 DIAGNOSIS — Z7952 Long term (current) use of systemic steroids: Secondary | ICD-10-CM | POA: Insufficient documentation

## 2020-11-26 DIAGNOSIS — Z95828 Presence of other vascular implants and grafts: Secondary | ICD-10-CM

## 2020-11-26 DIAGNOSIS — Z79899 Other long term (current) drug therapy: Secondary | ICD-10-CM | POA: Insufficient documentation

## 2020-11-26 DIAGNOSIS — C50919 Malignant neoplasm of unspecified site of unspecified female breast: Secondary | ICD-10-CM | POA: Diagnosis not present

## 2020-11-26 DIAGNOSIS — C7951 Secondary malignant neoplasm of bone: Secondary | ICD-10-CM | POA: Insufficient documentation

## 2020-11-26 DIAGNOSIS — C50412 Malignant neoplasm of upper-outer quadrant of left female breast: Secondary | ICD-10-CM | POA: Insufficient documentation

## 2020-11-26 DIAGNOSIS — Z5111 Encounter for antineoplastic chemotherapy: Secondary | ICD-10-CM | POA: Insufficient documentation

## 2020-11-26 DIAGNOSIS — C50912 Malignant neoplasm of unspecified site of left female breast: Secondary | ICD-10-CM | POA: Diagnosis present

## 2020-11-26 DIAGNOSIS — Z170 Estrogen receptor positive status [ER+]: Secondary | ICD-10-CM | POA: Insufficient documentation

## 2020-11-26 DIAGNOSIS — C787 Secondary malignant neoplasm of liver and intrahepatic bile duct: Secondary | ICD-10-CM | POA: Insufficient documentation

## 2020-11-26 MED ORDER — IOHEXOL 300 MG/ML  SOLN
100.0000 mL | Freq: Once | INTRAMUSCULAR | Status: AC | PRN
  Administered 2020-11-26: 100 mL via INTRAVENOUS

## 2020-11-26 MED ORDER — SODIUM CHLORIDE 0.9% FLUSH
10.0000 mL | Freq: Once | INTRAVENOUS | Status: AC
  Administered 2020-11-26: 10 mL
  Filled 2020-11-26: qty 10

## 2020-11-26 MED ORDER — HEPARIN SOD (PORK) LOCK FLUSH 100 UNIT/ML IV SOLN
INTRAVENOUS | Status: AC
  Administered 2020-11-26: 500 [IU] via INTRAVENOUS
  Filled 2020-11-26: qty 5

## 2020-11-26 MED ORDER — HEPARIN SOD (PORK) LOCK FLUSH 100 UNIT/ML IV SOLN: 500.0000 [IU] | Freq: Once | INTRAVENOUS | Status: AC

## 2020-11-26 NOTE — Progress Notes (Signed)
Patient Care Team: [name], MD as PCP - General (Family Medicine) Minus Breeding, MD as PCP - Cardiology (Cardiology) Nicholas Lose, MD as Consulting Physician (Hematology and Oncology)  DIAGNOSIS:    ICD-10-CM   1. Metastatic breast cancer ()  C50.919   2. Metastasis to bone (HCC)  C79.51   3. Malignant neoplasm of upper-outer quadrant of left breast in female, estrogen receptor positive ([place])  C50.412    Z17.0     SUMMARY OF ONCOLOGIC HISTORY: Oncology History  Breast cancer of upper-outer quadrant of left female breast ([place])  11/14/2011 Surgery   Bilateral mastectomy, prophylactic on the right, left breast IDC 3/18 lymph nodes positive with extracapsular extension ER 89%, PR 81%, HER-2 negative, Ki-67 79% T2 N1 A. stage IIB   12/13/2011 - 06/28/2012 Chemotherapy   4 cycles of FEC followed by 4 cycles of Taxotere   07/17/2012 - 08/22/2012 Radiation Therapy   Adjuvant radiation therapy   08/22/2012 - 03/16/2016 Anti-estrogen oral therapy   Arimidex 1 mg daily   03/16/2016 Relapse/Recurrence   Subcutaneous nodule excision left chest: Infiltrating carcinoma breast primary, ER positive, PR negative   03/29/2016 Imaging   CT CAP and bone scan: Lytic lesions T8 vertebral, T1 posterior element, subcutaneous nodule left lateral chest wall, nonspecific lung nodules; Bone scan: Mets to kull, left humerus, left eighth rib, T7/T8, sternum, left acetabulum   04/28/2016 - 06/17/2017 Chemotherapy   Herceptin, lapatinib, Faslodex, Zometa every 4 weeks, lapatinib discontinued in September 2018 due to elevation of LFTs   06/22/2017 Relapse/Recurrence   Surgical excision:Soft tissue mass left lateral chest wall primary breast cancer, soft tissue mass left medial chest wall breast cancer, tumor is within the dermis extending to the subcutaneous adipose tissue and involves portions of skeletal muscle   06/22/2017 Cancer Staging   Staging form: Breast, AJCC 7th Edition - Pathologic stage from  06/22/2017: Stage IV (TX, NX, M1) - Signed by Nicholas Lose, MD on 12/27/2019   08/2017 - 02/16/2018 Chemotherapy   Faslodex with Herceptin and Perjeta along with Zometa every 4 weeks   03/02/2018 - 05/25/2018 Chemotherapy   Kadcyla   05/11/2018 Imaging   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 Associated vasogenic edema within the underlying right cerebral hemisphere without significant midline shift. Signal abnormality throughout the visualized bone marrow, compatible with osseous metastatic disease.    06/04/2018 Imaging   CT CAP: Right lower lobe lung nodule 7 mm (was 5 mm); multiple bone metastases throughout the spine and ribs sternum scapula and humerus, slightly increased lower thoracic mets, right renal lesion 2.5 cm (was 1.2 cm) right femur met increased from 2.1 cm to 2.9 cm   06/15/2018 -  Anti-estrogen oral therapy   Abemaciclib, Herceptin, letrozole, Xgeva   06/07/2019 Imaging   Progression of brain metastases,2 discrete dural-based lesions involving the posterior right frontal lobe. The more anterior lesion now measures 16.5 x 17.5 x 14 mm. The posterior lesion now measures 9.5 x 13 x 20 mm.  Vasogenic edema of the frontal and parietal lobes   06/21/2019 - 06/27/2019 Radiation Therapy   SRS to the brain   07/19/2020 Relapse/Recurrence   Left mastectomy scar nodule: Biopsy invasive carcinoma ER 60%, PR 0%, HER-2 negative by [facility]   07/29/2020 Surgery   Soft tissue mass left chest wall excision: No evidence of malignancy.   11/27/2020 -  Chemotherapy    Patient is on Treatment Plan: BRAIN GBM BEVACIZUMAB 14D X 6 CYCLES   Patient is on Antibody Plan:  BREAST TRASTUZUMAB Q21D    Metastatic breast cancer (HCC)  06/29/2017 Initial Diagnosis   Metastatic breast cancer (HCC)   06/15/2018 -  Chemotherapy    Patient is on Treatment Plan: BRAIN GBM BEVACIZUMAB 14D X 6 CYCLES   Patient is on Antibody Plan: BREAST TRASTUZUMAB Q21D    Brain metastasis (HCC)   07/13/2018 Initial Diagnosis   Brain metastasis (HCC)   11/27/2020 -  Chemotherapy    Patient is on Treatment Plan: BRAIN GBM BEVACIZUMAB 14D X 6 CYCLES   Patient is on Antibody Plan: BREAST TRASTUZUMAB Q21D      CHIEF COMPLIANT: Follow-up of metastatic breast cancer  INTERVAL HISTORY: [name] Riley is a 70 y.o. with above-mentioned history of metastatic breast cancer currently on abemaciclib with letrozole along with Herceptin, Avastin, andXgeva. Echo on 11/17/20 showed an ejection fraction of 55-60%. CT CAP on 11/27/19 showed new hepatic lesions, stable osseous metastases, and no other findings of metastatic disease. She presents to the clinic today to review her scans.   ALLERGIES:  is allergic to cantaloupe extract allergy skin test, contrast media [iodinated diagnostic agents], pravastatin, zosyn [piperacillin sod-tazobactam so], and latex.  MEDICATIONS:  Current Outpatient Medications  Medication Sig Dispense Refill  . acetaminophen (TYLENOL) 500 MG tablet Take 1,000 mg by mouth every 6 (six) hours as needed for moderate pain.    . Ascorbic Acid (VITAMIN C PO) Take by mouth.    . cholecalciferol (VITAMIN D) 1000 units tablet Take 1 tablet (1,000 Units total) by mouth daily.    . dexamethasone (DECADRON) 2 MG tablet Take 2 tablets (4 mg total) by mouth daily. 60 tablet 0  . Docusate Sodium (DSS) 100 MG CAPS Take by mouth.    . letrozole (FEMARA) 2.5 MG tablet TAKE ONE (1) TABLET EACH DAY 90 tablet 3  . levETIRAcetam (KEPPRA) 500 MG tablet Take 1 tablet (500 mg total) by mouth 2 (two) times daily. 60 tablet 3  . levothyroxine (SYNTHROID) 100 MCG tablet TAKE ONE TABLET EACH MORNING BEFORE BREAKFAST 90 tablet 2  . lidocaine-prilocaine (EMLA) cream APPLY TOPICALLY AS NEEDED FOR PORT ACCESS 30 g 3  . metoprolol succinate (TOPROL-XL) 100 MG 24 hr tablet TAKE ONE (1) TABLET EACH DAY 90 tablet 0  . Multiple Vitamins-Minerals (MULTIVITAMIN WITH MINERALS) tablet Take 1 tablet by mouth daily.     . ondansetron (ZOFRAN-ODT) 4 MG disintegrating tablet TAKE 1 TABLET EVERY 6 HOURS AS NEEDED FOR NAUSEA AND VOMITING 30 tablet 3  . predniSONE (DELTASONE) 50 MG tablet Prednisone 50mg PO 13 hours, 7 hours, and 1 hour before CT scan. 3 tablet 0  . rosuvastatin (CRESTOR) 5 MG tablet Take 1 tablet by mouth 2 (two) times daily.     . triamcinolone (KENALOG) 0.025 % ointment Apply 1 application topically 2 (two) times daily. 80 g 1  . valsartan (DIOVAN) 80 MG tablet TAKE ONE (1) TABLET EACH DAY 90 tablet 1  . VERZENIO 100 MG tablet TAKE 1 TABLET (100 MG TOTAL) BY MOUTH 2 (TWO) TIMES DAILY. SWALLOW TABLETS WHOLE. DO NOT CHEW, CRUSH, OR SPLIT TABLETS BEFORE SWALLOWING. 56 tablet 3   No current facility-administered medications for this visit.    PHYSICAL EXAMINATION: ECOG PERFORMANCE STATUS: 1 - Symptomatic but completely ambulatory  Vitals:   11/27/20 0935  BP: (!) 158/84  Pulse: 81  Resp: 17  Temp: (!) 97.5 F (36.4 C)  SpO2: 100%   Filed Weights   11/27/20 0935  Weight: 162 lb 4.8 oz (73.6 kg)      LABORATORY DATA:  I have reviewed the data as listed CMP Latest Ref Rng & Units 10/30/2020 10/02/2020 09/04/2020  Glucose 70 - 99 mg/dL 91 116(H) 93  BUN 8 - 23 mg/dL 30(H) 18 15  Creatinine 0.44 - 1.00 mg/dL 0.95 0.92 0.84  Sodium 135 - 145 mmol/L 139 140 139  Potassium 3.5 - 5.1 mmol/L 3.8 3.6 3.7  Chloride 98 - 111 mmol/L 104 104 104  CO2 22 - 32 mmol/L 26 28 28  Calcium 8.9 - 10.3 mg/dL 9.6 9.7 9.3  Total Protein 6.5 - 8.1 g/dL 6.8 6.8 6.7  Total Bilirubin 0.3 - 1.2 mg/dL 0.6 0.5 0.5  Alkaline Phos 38 - 126 U/L 157(H) 176(H) 161(H)  AST 15 - 41 U/L 50(H) 48(H) 40  ALT 0 - 44 U/L 48(H) 37 21    Lab Results  Component Value Date   WBC 7.5 11/27/2020   HGB 10.8 (L) 11/27/2020   HCT 32.2 (L) 11/27/2020   MCV 108.1 (H) 11/27/2020   PLT 108 (L) 11/27/2020   NEUTROABS 5.7 11/27/2020    ASSESSMENT & PLAN:  Breast cancer of upper-outer quadrant of left female breast  (HCC) Current treatment: Abemaciclibwith letrozole along with Herceptin(to be given every 4 weeks)and Xgeva started 06/15/2018  CT CAP 05/25/2020: No significant interval change in the bone metastases. 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 Vaslow is adding Avastin to the treatment. Today is cycle 1  CTCAP 11/26/2020: New hepatic lesions worrisome for hepatic mets (1.6 cm, 1.2 cm, 2.3 cm)  Recommendation: 1. Biopsy of the liver lesion. 2. Change treatment plan accordingly. If Her 2 positive, we can consider Kadcyla or Enhertu; If Her 2 Neg, we can add faslodex with Abemaciclib and Letrozole.  RTC after biopsy   No orders of the defined types were placed in this encounter.  The patient has a good understanding of the overall plan. she agrees with it. she will call with any problems that may develop before the next visit here.  Total time spent: 30 mins including face to face time and time spent for planning, charting and coordination of care  Vinay K Gudena, MD, MPH 11/27/2020  I, Molly Dorshimer, am acting as scribe for Dr. Vinay Gudena.  I have reviewed the above documentation for accuracy and completeness, and I agree with the above.       

## 2020-11-27 ENCOUNTER — Encounter (HOSPITAL_COMMUNITY): Payer: Self-pay | Admitting: Radiology

## 2020-11-27 ENCOUNTER — Other Ambulatory Visit: Payer: Self-pay | Admitting: Hematology and Oncology

## 2020-11-27 ENCOUNTER — Inpatient Hospital Stay (HOSPITAL_BASED_OUTPATIENT_CLINIC_OR_DEPARTMENT_OTHER): Payer: Medicare Other | Admitting: Hematology and Oncology

## 2020-11-27 ENCOUNTER — Inpatient Hospital Stay: Payer: Medicare Other

## 2020-11-27 ENCOUNTER — Other Ambulatory Visit: Payer: Self-pay

## 2020-11-27 ENCOUNTER — Other Ambulatory Visit: Payer: Self-pay | Admitting: Internal Medicine

## 2020-11-27 VITALS — BP 158/84 | HR 81 | Temp 97.5°F | Resp 17 | Ht 68.0 in | Wt 162.3 lb

## 2020-11-27 DIAGNOSIS — C50919 Malignant neoplasm of unspecified site of unspecified female breast: Secondary | ICD-10-CM

## 2020-11-27 DIAGNOSIS — Z170 Estrogen receptor positive status [ER+]: Secondary | ICD-10-CM

## 2020-11-27 DIAGNOSIS — C50412 Malignant neoplasm of upper-outer quadrant of left female breast: Secondary | ICD-10-CM

## 2020-11-27 DIAGNOSIS — C7931 Secondary malignant neoplasm of brain: Secondary | ICD-10-CM

## 2020-11-27 DIAGNOSIS — C7951 Secondary malignant neoplasm of bone: Secondary | ICD-10-CM

## 2020-11-27 DIAGNOSIS — Z95828 Presence of other vascular implants and grafts: Secondary | ICD-10-CM

## 2020-11-27 DIAGNOSIS — I6789 Other cerebrovascular disease: Secondary | ICD-10-CM

## 2020-11-27 DIAGNOSIS — Z5111 Encounter for antineoplastic chemotherapy: Secondary | ICD-10-CM | POA: Diagnosis not present

## 2020-11-27 DIAGNOSIS — Z79899 Other long term (current) drug therapy: Secondary | ICD-10-CM | POA: Diagnosis not present

## 2020-11-27 DIAGNOSIS — Z7952 Long term (current) use of systemic steroids: Secondary | ICD-10-CM | POA: Diagnosis not present

## 2020-11-27 DIAGNOSIS — C787 Secondary malignant neoplasm of liver and intrahepatic bile duct: Secondary | ICD-10-CM | POA: Diagnosis not present

## 2020-11-27 LAB — CBC WITH DIFFERENTIAL (CANCER CENTER ONLY)
Abs Immature Granulocytes: 0.08 10*3/uL — ABNORMAL HIGH (ref 0.00–0.07)
Basophils Absolute: 0 10*3/uL (ref 0.0–0.1)
Basophils Relative: 0 %
Eosinophils Absolute: 0 10*3/uL (ref 0.0–0.5)
Eosinophils Relative: 0 %
HCT: 32.2 % — ABNORMAL LOW (ref 36.0–46.0)
Hemoglobin: 10.8 g/dL — ABNORMAL LOW (ref 12.0–15.0)
Immature Granulocytes: 1 %
Lymphocytes Relative: 16 %
Lymphs Abs: 1.2 10*3/uL (ref 0.7–4.0)
MCH: 36.2 pg — ABNORMAL HIGH (ref 26.0–34.0)
MCHC: 33.5 g/dL (ref 30.0–36.0)
MCV: 108.1 fL — ABNORMAL HIGH (ref 80.0–100.0)
Monocytes Absolute: 0.5 10*3/uL (ref 0.1–1.0)
Monocytes Relative: 7 %
Neutro Abs: 5.7 10*3/uL (ref 1.7–7.7)
Neutrophils Relative %: 76 %
Platelet Count: 108 10*3/uL — ABNORMAL LOW (ref 150–400)
RBC: 2.98 MIL/uL — ABNORMAL LOW (ref 3.87–5.11)
RDW: 14.6 % (ref 11.5–15.5)
WBC Count: 7.5 10*3/uL (ref 4.0–10.5)
nRBC: 0 % (ref 0.0–0.2)

## 2020-11-27 LAB — CMP (CANCER CENTER ONLY)
ALT: 116 U/L — ABNORMAL HIGH (ref 0–44)
AST: 85 U/L — ABNORMAL HIGH (ref 15–41)
Albumin: 3.1 g/dL — ABNORMAL LOW (ref 3.5–5.0)
Alkaline Phosphatase: 144 U/L — ABNORMAL HIGH (ref 38–126)
Anion gap: 4 — ABNORMAL LOW (ref 5–15)
BUN: 28 mg/dL — ABNORMAL HIGH (ref 8–23)
CO2: 29 mmol/L (ref 22–32)
Calcium: 8.9 mg/dL (ref 8.9–10.3)
Chloride: 104 mmol/L (ref 98–111)
Creatinine: 0.95 mg/dL (ref 0.44–1.00)
GFR, Estimated: >60 mL/min (ref >60)
Glucose, Bld: 107 mg/dL — ABNORMAL HIGH (ref 70–99)
Potassium: 3.5 mmol/L (ref 3.5–5.1)
Sodium: 137 mmol/L (ref 135–145)
Total Bilirubin: 0.5 mg/dL (ref 0.3–1.2)
Total Protein: 6.1 g/dL — ABNORMAL LOW (ref 6.5–8.1)

## 2020-11-27 LAB — TOTAL PROTEIN, URINE DIPSTICK: Protein, ur: NEGATIVE mg/dL

## 2020-11-27 MED ORDER — SODIUM CHLORIDE 0.9 % IV SOLN
10.0000 mg/kg | Freq: Once | INTRAVENOUS | Status: AC
  Administered 2020-11-27: 700 mg via INTRAVENOUS
  Filled 2020-11-27: qty 16

## 2020-11-27 MED ORDER — HEPARIN SOD (PORK) LOCK FLUSH 100 UNIT/ML IV SOLN
500.0000 [IU] | Freq: Once | INTRAVENOUS | Status: AC | PRN
  Administered 2020-11-27: 500 [IU]
  Filled 2020-11-27: qty 5

## 2020-11-27 MED ORDER — ACETAMINOPHEN 325 MG PO TABS
ORAL_TABLET | ORAL | Status: AC
  Filled 2020-11-27: qty 2

## 2020-11-27 MED ORDER — SODIUM CHLORIDE 0.9 % IV SOLN
Freq: Once | INTRAVENOUS | Status: AC
  Filled 2020-11-27: qty 250

## 2020-11-27 MED ORDER — TRASTUZUMAB-DKST CHEMO 150 MG IV SOLR
6.0000 mg/kg | Freq: Once | INTRAVENOUS | Status: AC
  Administered 2020-11-27: 399 mg via INTRAVENOUS
  Filled 2020-11-27: qty 19

## 2020-11-27 MED ORDER — ALTEPLASE 2 MG IJ SOLR
2.0000 mg | Freq: Once | INTRAMUSCULAR | Status: AC
  Administered 2020-11-27: 2 mg
  Filled 2020-11-27: qty 2

## 2020-11-27 MED ORDER — DENOSUMAB 120 MG/1.7ML ~~LOC~~ SOLN
120.0000 mg | Freq: Once | SUBCUTANEOUS | Status: AC
  Administered 2020-11-27: 120 mg via SUBCUTANEOUS

## 2020-11-27 MED ORDER — SODIUM CHLORIDE 0.9% FLUSH
10.0000 mL | Freq: Once | INTRAVENOUS | Status: AC
  Administered 2020-11-27: 10 mL
  Filled 2020-11-27: qty 10

## 2020-11-27 MED ORDER — DIPHENHYDRAMINE HCL 25 MG PO CAPS
50.0000 mg | ORAL_CAPSULE | Freq: Once | ORAL | Status: AC
  Administered 2020-11-27: 50 mg via ORAL

## 2020-11-27 MED ORDER — DENOSUMAB 120 MG/1.7ML ~~LOC~~ SOLN
SUBCUTANEOUS | Status: AC
  Filled 2020-11-27: qty 1.7

## 2020-11-27 MED ORDER — DIPHENHYDRAMINE HCL 25 MG PO CAPS
ORAL_CAPSULE | ORAL | Status: AC
  Filled 2020-11-27: qty 2

## 2020-11-27 MED ORDER — ALTEPLASE 2 MG IJ SOLR
INTRAMUSCULAR | Status: AC
  Filled 2020-11-27: qty 2

## 2020-11-27 MED ORDER — ACETAMINOPHEN 325 MG PO TABS
650.0000 mg | ORAL_TABLET | Freq: Once | ORAL | Status: AC
  Administered 2020-11-27: 650 mg via ORAL

## 2020-11-27 MED ORDER — SODIUM CHLORIDE 0.9% FLUSH
10.0000 mL | INTRAVENOUS | Status: DC | PRN
  Administered 2020-11-27: 10 mL
  Filled 2020-11-27: qty 10

## 2020-11-27 NOTE — Patient Instructions (Signed)

## 2020-11-27 NOTE — Assessment & Plan Note (Signed)
Current treatment: Abemaciclibwith letrozole along with Herceptin(to be given every 4 weeks)and Xgeva started 06/15/2018  CT CAP 05/25/2020: No significant interval change in the bone metastases. 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 Mickeal Skinner is adding Avastin to the treatment. She has scans scheduled for 11/26/2020.  CTCAP 11/26/2020: New hepatic lesions worrisome for hepatic mets (1.6 cm, 1.2 cm, 2.3 cm)  Recommendation: 1. Biopsy of the liver lesion. 2. Change treatment plan accordingly. If Her 2 positive, we can consider Kadcyla or Enhertu; If Her 2 Neg, we can add faslodex with Abemaciclib and Letrozole.  RTC after biopsy

## 2020-11-27 NOTE — Patient Instructions (Signed)
Denosumab injection What is this medicine? DENOSUMAB (den oh sue mab) slows bone breakdown. Prolia is used to treat osteoporosis in women after menopause and in men, and in people who are taking corticosteroids for 6 months or more. Xgeva is used to treat a high calcium level due to cancer and to prevent bone fractures and other bone problems caused by multiple myeloma or cancer bone metastases. Xgeva is also used to treat giant cell tumor of the bone. This medicine may be used for other purposes; ask your health care provider or pharmacist if you have questions. COMMON BRAND NAME(S): Prolia, XGEVA What should I tell my health care provider before I take this medicine? They need to know if you have any of these conditions:  dental disease  having surgery or tooth extraction  infection  kidney disease  low levels of calcium or Vitamin D in the blood  malnutrition  on hemodialysis  skin conditions or sensitivity  thyroid or parathyroid disease  an unusual reaction to denosumab, other medicines, foods, dyes, or preservatives  pregnant or trying to get pregnant  breast-feeding How should I use this medicine? This medicine is for injection under the skin. It is given by a health care professional in a hospital or clinic setting. A special MedGuide will be given to you before each treatment. Be sure to read this information carefully each time. For Prolia, talk to your pediatrician regarding the use of this medicine in children. Special care may be needed. For Xgeva, talk to your pediatrician regarding the use of this medicine in children. While this drug may be prescribed for children as young as 13 years for selected conditions, precautions do apply. Overdosage: If you think you have taken too much of this medicine contact a poison control center or emergency room at once. NOTE: This medicine is only for you. Do not share this medicine with others. What if I miss a dose? It is  important not to miss your dose. Call your doctor or health care professional if you are unable to keep an appointment. What may interact with this medicine? Do not take this medicine with any of the following medications:  other medicines containing denosumab This medicine may also interact with the following medications:  medicines that lower your chance of fighting infection  steroid medicines like prednisone or cortisone This list may not describe all possible interactions. Give your health care provider a list of all the medicines, herbs, non-prescription drugs, or dietary supplements you use. Also tell them if you smoke, drink alcohol, or use illegal drugs. Some items may interact with your medicine. What should I watch for while using this medicine? Visit your doctor or health care professional for regular checks on your progress. Your doctor or health care professional may order blood tests and other tests to see how you are doing. Call your doctor or health care professional for advice if you get a fever, chills or sore throat, or other symptoms of a cold or flu. Do not treat yourself. This drug may decrease your body's ability to fight infection. Try to avoid being around people who are sick. You should make sure you get enough calcium and vitamin D while you are taking this medicine, unless your doctor tells you not to. Discuss the foods you eat and the vitamins you take with your health care professional. See your dentist regularly. Brush and floss your teeth as directed. Before you have any dental work done, tell your dentist you are   receiving this medicine. Do not become pregnant while taking this medicine or for 5 months after stopping it. Talk with your doctor or health care professional about your birth control options while taking this medicine. Women should inform their doctor if they wish to become pregnant or think they might be pregnant. There is a potential for serious side  effects to an unborn child. Talk to your health care professional or pharmacist for more information. What side effects may I notice from receiving this medicine? Side effects that you should report to your doctor or health care professional as soon as possible:  allergic reactions like skin rash, itching or hives, swelling of the face, lips, or tongue  bone pain  breathing problems  dizziness  jaw pain, especially after dental work  redness, blistering, peeling of the skin  signs and symptoms of infection like fever or chills; cough; sore throat; pain or trouble passing urine  signs of low calcium like fast heartbeat, muscle cramps or muscle pain; pain, tingling, numbness in the hands or feet; seizures  unusual bleeding or bruising  unusually weak or tired Side effects that usually do not require medical attention (report to your doctor or health care professional if they continue or are bothersome):  constipation  diarrhea  headache  joint pain  loss of appetite  muscle pain  runny nose  tiredness  upset stomach This list may not describe all possible side effects. Call your doctor for medical advice about side effects. You may report side effects to FDA at 1-800-FDA-1088. Where should I keep my medicine? This medicine is only given in a clinic, doctor's office, or other health care setting and will not be stored at home. NOTE: This sheet is a summary. It may not cover all possible information. If you have questions about this medicine, talk to your doctor, pharmacist, or health care provider.  2021 Elsevier/Gold Standard (2018-02-09 16:10:44) Bevacizumab injection What is this medicine? BEVACIZUMAB (be va SIZ yoo mab) is a monoclonal antibody. It is used to treat many types of cancer. This medicine may be used for other purposes; ask your health care provider or pharmacist if you have questions. COMMON BRAND NAME(S): Avastin, MVASI, Zirabev What should I tell  my health care provider before I take this medicine? They need to know if you have any of these conditions:  diabetes  heart disease  high blood pressure  history of coughing up blood  prior anthracycline chemotherapy (e.g., doxorubicin, daunorubicin, epirubicin)  recent or ongoing radiation therapy  recent or planning to have surgery  stroke  an unusual or allergic reaction to bevacizumab, hamster proteins, mouse proteins, other medicines, foods, dyes, or preservatives  pregnant or trying to get pregnant  breast-feeding How should I use this medicine? This medicine is for infusion into a vein. It is given by a health care professional in a hospital or clinic setting. Talk to your pediatrician regarding the use of this medicine in children. Special care may be needed. Overdosage: If you think you have taken too much of this medicine contact a poison control center or emergency room at once. NOTE: This medicine is only for you. Do not share this medicine with others. What if I miss a dose? It is important not to miss your dose. Call your doctor or health care professional if you are unable to keep an appointment. What may interact with this medicine? Interactions are not expected. This list may not describe all possible interactions. Give your health care  provider a list of all the medicines, herbs, non-prescription drugs, or dietary supplements you use. Also tell them if you smoke, drink alcohol, or use illegal drugs. Some items may interact with your medicine. What should I watch for while using this medicine? Your condition will be monitored carefully while you are receiving this medicine. You will need important blood work and urine testing done while you are taking this medicine. This medicine may increase your risk to bruise or bleed. Call your doctor or health care professional if you notice any unusual bleeding. Before having surgery, talk to your health care provider to  make sure it is ok. This drug can increase the risk of poor healing of your surgical site or wound. You will need to stop this drug for 28 days before surgery. After surgery, wait at least 28 days before restarting this drug. Make sure the surgical site or wound is healed enough before restarting this drug. Talk to your health care provider if questions. Do not become pregnant while taking this medicine or for 6 months after stopping it. Women should inform their doctor if they wish to become pregnant or think they might be pregnant. There is a potential for serious side effects to an unborn child. Talk to your health care professional or pharmacist for more information. Do not breast-feed an infant while taking this medicine and for 6 months after the last dose. This medicine has caused ovarian failure in some women. This medicine may interfere with the ability to have a child. You should talk to your doctor or health care professional if you are concerned about your fertility. What side effects may I notice from receiving this medicine? Side effects that you should report to your doctor or health care professional as soon as possible:  allergic reactions like skin rash, itching or hives, swelling of the face, lips, or tongue  chest pain or chest tightness  chills  coughing up blood  high fever  seizures  severe constipation  signs and symptoms of bleeding such as bloody or black, tarry stools; red or dark-brown urine; spitting up blood or brown material that looks like coffee grounds; red spots on the skin; unusual bruising or bleeding from the eye, gums, or nose  signs and symptoms of a blood clot such as breathing problems; chest pain; severe, sudden headache; pain, swelling, warmth in the leg  signs and symptoms of a stroke like changes in vision; confusion; trouble speaking or understanding; severe headaches; sudden numbness or weakness of the face, arm or leg; trouble walking;  dizziness; loss of balance or coordination  stomach pain  sweating  swelling of legs or ankles  vomiting  weight gain Side effects that usually do not require medical attention (report to your doctor or health care professional if they continue or are bothersome):  back pain  changes in taste  decreased appetite  dry skin  nausea  tiredness This list may not describe all possible side effects. Call your doctor for medical advice about side effects. You may report side effects to FDA at 1-800-FDA-1088. Where should I keep my medicine? This drug is given in a hospital or clinic and will not be stored at home. NOTE: This sheet is a summary. It may not cover all possible information. If you have questions about this medicine, talk to your doctor, pharmacist, or health care provider.  2021 Elsevier/Gold Standard (2019-07-31 10:50:46) Trastuzumab injection for infusion What is this medicine? TRASTUZUMAB (tras TOO zoo mab) is a  monoclonal antibody. It is used to treat breast cancer and stomach cancer. This medicine may be used for other purposes; ask your health care provider or pharmacist if you have questions. COMMON BRAND NAME(S): Herceptin, Galvin Proffer, Trazimera What should I tell my health care provider before I take this medicine? They need to know if you have any of these conditions:  heart disease  heart failure  lung or breathing disease, like asthma  an unusual or allergic reaction to trastuzumab, benzyl alcohol, or other medications, foods, dyes, or preservatives  pregnant or trying to get pregnant  breast-feeding How should I use this medicine? This drug is given as an infusion into a vein. It is administered in a hospital or clinic by a specially trained health care professional. Talk to your pediatrician regarding the use of this medicine in children. This medicine is not approved for use in children. Overdosage: If you think you  have taken too much of this medicine contact a poison control center or emergency room at once. NOTE: This medicine is only for you. Do not share this medicine with others. What if I miss a dose? It is important not to miss a dose. Call your doctor or health care professional if you are unable to keep an appointment. What may interact with this medicine? This medicine may interact with the following medications:  certain types of chemotherapy, such as daunorubicin, doxorubicin, epirubicin, and idarubicin This list may not describe all possible interactions. Give your health care provider a list of all the medicines, herbs, non-prescription drugs, or dietary supplements you use. Also tell them if you smoke, drink alcohol, or use illegal drugs. Some items may interact with your medicine. What should I watch for while using this medicine? Visit your doctor for checks on your progress. Report any side effects. Continue your course of treatment even though you feel ill unless your doctor tells you to stop. Call your doctor or health care professional for advice if you get a fever, chills or sore throat, or other symptoms of a cold or flu. Do not treat yourself. Try to avoid being around people who are sick. You may experience fever, chills and shaking during your first infusion. These effects are usually mild and can be treated with other medicines. Report any side effects during the infusion to your health care professional. Fever and chills usually do not happen with later infusions. Do not become pregnant while taking this medicine or for 7 months after stopping it. Women should inform their doctor if they wish to become pregnant or think they might be pregnant. Women of child-bearing potential will need to have a negative pregnancy test before starting this medicine. There is a potential for serious side effects to an unborn child. Talk to your health care professional or pharmacist for more information.  Do not breast-feed an infant while taking this medicine or for 7 months after stopping it. Women must use effective birth control with this medicine. What side effects may I notice from receiving this medicine? Side effects that you should report to your doctor or health care professional as soon as possible:  allergic reactions like skin rash, itching or hives, swelling of the face, lips, or tongue  chest pain or palpitations  cough  dizziness  feeling faint or lightheaded, falls  fever  general ill feeling or flu-like symptoms  signs of worsening heart failure like breathing problems; swelling in your legs and feet  unusually weak or tired Side  effects that usually do not require medical attention (report to your doctor or health care professional if they continue or are bothersome):  bone pain  changes in taste  diarrhea  joint pain  nausea/vomiting  weight loss This list may not describe all possible side effects. Call your doctor for medical advice about side effects. You may report side effects to FDA at 1-800-FDA-1088. Where should I keep my medicine? This drug is given in a hospital or clinic and will not be stored at home. NOTE: This sheet is a summary. It may not cover all possible information. If you have questions about this medicine, talk to your doctor, pharmacist, or health care provider.  2021 Elsevier/Gold Standard (2016-09-27 14:37:52)

## 2020-11-27 NOTE — Progress Notes (Signed)
[name] Riley Female, 70 y.o., May 26, 1951  MRN:  [id number] Phone:  [phone] [name])       PCP:  [name], MD Primary Cvg:  Medicare/Medicare Part A And B  Next Appt With Oncology 12/25/2020 at 9:30 AM           RE: US BIOPSY (LIVER) Received: Today Suttle, [name], MD  Garth Bigness D  Approved for ultrasound guided focal liver biopsy, segment 6.   Dylan        Previous Messages   ----- Message -----  From: Garth Bigness D  Sent: 11/27/2020 10:18 AM EST  To: [facility] Procedure Requests  Subject: US BIOPSY (LIVER)                 Procedure:  US BIOPSY (LIVER)   Reason: Metastatic breast cancer, new liver mets with H/O Met breast cancer for prognostic markers   History: NM. MR, CT in computer   Provider: Nicholas Lose     Provider Contact: [phone]

## 2020-12-01 ENCOUNTER — Telehealth: Payer: Self-pay

## 2020-12-01 NOTE — Telephone Encounter (Signed)
Pt called stating since receiving the new medication, Xgeva, she has developed mouth sores and wants to know if she can have a rx for this?  Pt also states she is having headaches and normally does not and wants to know if she can take Tylenol?  Lastly pt states she has some swelling in her ankles and feet for a few days after the tx and wanted to know if she should be concerned?

## 2020-12-10 ENCOUNTER — Other Ambulatory Visit: Payer: Self-pay | Admitting: Radiology

## 2020-12-10 ENCOUNTER — Other Ambulatory Visit: Payer: Self-pay | Admitting: Student

## 2020-12-11 ENCOUNTER — Ambulatory Visit (HOSPITAL_COMMUNITY)
Admission: RE | Admit: 2020-12-11 | Discharge: 2020-12-11 | Disposition: A | Discharge status: Home / Self Care | Payer: Medicare Other | Source: Ambulatory Visit | Attending: Hematology and Oncology | Admitting: Hematology and Oncology

## 2020-12-11 ENCOUNTER — Other Ambulatory Visit: Payer: Self-pay

## 2020-12-11 DIAGNOSIS — C787 Secondary malignant neoplasm of liver and intrahepatic bile duct: Secondary | ICD-10-CM | POA: Insufficient documentation

## 2020-12-11 DIAGNOSIS — I700 Atherosclerosis of aorta: Secondary | ICD-10-CM | POA: Diagnosis not present

## 2020-12-11 DIAGNOSIS — I252 Old myocardial infarction: Secondary | ICD-10-CM | POA: Diagnosis not present

## 2020-12-11 DIAGNOSIS — I10 Essential (primary) hypertension: Secondary | ICD-10-CM | POA: Insufficient documentation

## 2020-12-11 DIAGNOSIS — C50919 Malignant neoplasm of unspecified site of unspecified female breast: Secondary | ICD-10-CM | POA: Diagnosis not present

## 2020-12-11 DIAGNOSIS — E785 Hyperlipidemia, unspecified: Secondary | ICD-10-CM | POA: Insufficient documentation

## 2020-12-11 DIAGNOSIS — Z800 Family history of malignant neoplasm of digestive organs: Secondary | ICD-10-CM | POA: Insufficient documentation

## 2020-12-11 DIAGNOSIS — Z8042 Family history of malignant neoplasm of prostate: Secondary | ICD-10-CM | POA: Insufficient documentation

## 2020-12-11 DIAGNOSIS — Z79899 Other long term (current) drug therapy: Secondary | ICD-10-CM | POA: Insufficient documentation

## 2020-12-11 DIAGNOSIS — E039 Hypothyroidism, unspecified: Secondary | ICD-10-CM | POA: Diagnosis not present

## 2020-12-11 DIAGNOSIS — K8020 Calculus of gallbladder without cholecystitis without obstruction: Secondary | ICD-10-CM | POA: Diagnosis not present

## 2020-12-11 DIAGNOSIS — R160 Hepatomegaly, not elsewhere classified: Secondary | ICD-10-CM | POA: Diagnosis not present

## 2020-12-11 DIAGNOSIS — Z853 Personal history of malignant neoplasm of breast: Secondary | ICD-10-CM | POA: Insufficient documentation

## 2020-12-11 DIAGNOSIS — Z801 Family history of malignant neoplasm of trachea, bronchus and lung: Secondary | ICD-10-CM | POA: Insufficient documentation

## 2020-12-11 DIAGNOSIS — Z7901 Long term (current) use of anticoagulants: Secondary | ICD-10-CM | POA: Insufficient documentation

## 2020-12-11 DIAGNOSIS — I2510 Atherosclerotic heart disease of native coronary artery without angina pectoris: Secondary | ICD-10-CM | POA: Diagnosis not present

## 2020-12-11 LAB — CBC
HCT: 33.9 % — ABNORMAL LOW (ref 36.0–46.0)
Hemoglobin: 11.5 g/dL — ABNORMAL LOW (ref 12.0–15.0)
MCH: 36.7 pg — ABNORMAL HIGH (ref 26.0–34.0)
MCHC: 33.9 g/dL (ref 30.0–36.0)
MCV: 108.3 fL — ABNORMAL HIGH (ref 80.0–100.0)
Platelets: 112 10*3/uL — ABNORMAL LOW (ref 150–400)
RBC: 3.13 MIL/uL — ABNORMAL LOW (ref 3.87–5.11)
RDW: 14.2 % (ref 11.5–15.5)
WBC: 5.2 10*3/uL (ref 4.0–10.5)
nRBC: 0.4 % — ABNORMAL HIGH (ref 0.0–0.2)

## 2020-12-11 LAB — PROTIME-INR
INR: 1 (ref 0.8–1.2)
Prothrombin Time: 12.7 seconds (ref 11.4–15.2)

## 2020-12-11 MED ORDER — FENTANYL CITRATE (PF) 100 MCG/2ML IJ SOLN
INTRAMUSCULAR | Status: AC | PRN
  Administered 2020-12-11 (×2): 50 ug via INTRAVENOUS

## 2020-12-11 MED ORDER — MIDAZOLAM HCL 2 MG/2ML IJ SOLN
INTRAMUSCULAR | Status: AC | PRN
  Administered 2020-12-11 (×2): 1 mg via INTRAVENOUS

## 2020-12-11 MED ORDER — FENTANYL CITRATE (PF) 100 MCG/2ML IJ SOLN
INTRAMUSCULAR | Status: AC
  Filled 2020-12-11: qty 2

## 2020-12-11 MED ORDER — LIDOCAINE HCL 1 % IJ SOLN
INTRAMUSCULAR | Status: AC
  Filled 2020-12-11: qty 20

## 2020-12-11 MED ORDER — GELATIN ABSORBABLE 12-7 MM EX MISC
CUTANEOUS | Status: AC
  Filled 2020-12-11: qty 1

## 2020-12-11 MED ORDER — SODIUM CHLORIDE 0.9 % IV SOLN: INTRAVENOUS | Status: DC

## 2020-12-11 MED ORDER — MIDAZOLAM HCL 2 MG/2ML IJ SOLN
INTRAMUSCULAR | Status: AC
  Filled 2020-12-11: qty 2

## 2020-12-11 MED ORDER — LIDOCAINE HCL (PF) 1 % IJ SOLN
INTRAMUSCULAR | Status: AC | PRN
  Administered 2020-12-11: 10 mL via INTRADERMAL

## 2020-12-11 NOTE — Discharge Instructions (Signed)
Liver Biopsy, Care After These instructions give you information on caring for yourself after your procedure. Your doctor may also give you more specific instructions. Call your doctor if you have any problems or questions after your procedure. What can I expect after the procedure? After the procedure, it is common to have:  Pain and soreness where the biopsy was done.  Bruising around the area where the biopsy was done.  Sleepiness and be tired for a few days. Follow these instructions at home: Medicines  Take over-the-counter and prescription medicines only as told by your doctor.  If you were prescribed an antibiotic medicine, take it as told by your doctor. Do not stop taking the antibiotic even if you start to feel better.  Do not take medicines such as aspirin and ibuprofen. These medicines can thin your blood. Do not take these medicines unless your doctor tells you to take them.  If you are taking prescription pain medicine, take actions to prevent or treat constipation. Your doctor may recommend that you: ? Drink enough fluid to keep your pee (urine) clear or pale yellow. ? Take over-the-counter or prescription medicines. ? Eat foods that are high in fiber, such as fresh fruits and vegetables, whole grains, and beans. ? Limit foods that are high in fat and processed sugars, such as fried and sweet foods. Caring for your cut  Follow instructions from your doctor about how to take care of your cuts from surgery (incisions). Make sure you: ? Wash your hands with soap and water before you change your bandage (dressing). If you cannot use soap and water, use hand sanitizer. ? Change your bandage as told by your doctor. ? Leave stitches (sutures), skin glue, or skin tape (adhesive) strips in place. They may need to stay in place for 2 weeks or longer. If tape strips get loose and curl up, you may trim the loose edges. Do not remove tape strips completely unless your doctor says it  is okay.  Check your cuts every day for signs of infection. Check for: ? Redness, swelling, or more pain. ? Fluid or blood. ? Pus or a bad smell. ? Warmth.  Do not take baths, swim, or use a hot tub until your doctor says it is okay to do so. Activity  Rest at home for 1-2 days or as told by your doctor. ? Avoid sitting for a long time without moving. Get up to take short walks every 1-2 hours.  Return to your normal activities as told by your doctor. Ask what activities are safe for you.  Do not do these things in the first 24 hours: ? Drive. ? Use machinery. ? Take a bath or shower.  Do not lift more than 10 pounds (4.5 kg) or play contact sports for the first 2 weeks.   General instructions  Do not drink alcohol in the first week after the procedure.  Have someone stay with you for at least 24 hours after the procedure.  Get your test results. Ask your doctor or the department that is doing the test: ? When will my results be ready? ? How will I get my results? ? What are my treatment options? ? What other tests do I need? ? What are my next steps?  Keep all follow-up visits as told by your doctor. This is important.   Contact a doctor if:  A cut bleeds and leaves more than just a small spot of blood.  A cut is red,  puffs up (swells), or hurts more than before.  Fluid or something else comes from a cut.  A cut smells bad.  You have a fever or chills. Get help right away if:  You have swelling, bloating, or pain in your belly (abdomen).  You get dizzy or faint.  You have a rash.  You feel sick to your stomach (nauseous) or throw up (vomit).  You have trouble breathing, feel short of breath, or feel faint.  Your chest hurts.  You have problems talking or seeing.  You have trouble with your balance or moving your arms or legs. Summary  After the procedure, it is common to have pain, soreness, bruising, and tiredness.  Your doctor will tell you how  to take care of yourself at home. Change your bandage, take your medicines, and limit your activities as told by your doctor.  Call your doctor if you have symptoms of infection. Get help right away if your belly swells, your cut bleeds a lot, or you have trouble talking or breathing. This information is not intended to replace advice given to you by your health care provider. Make sure you discuss any questions you have with your health care provider.   Moderate Conscious Sedation, Adult, Care After This sheet gives you information about how to care for yourself after your procedure. Your health care provider may also give you more specific instructions. If you have problems or questions, contact your health care provider. What can I expect after the procedure? After the procedure, it is common to have:  Sleepiness for several hours.  Impaired judgment for several hours.  Difficulty with balance.  Vomiting if you eat too soon. Follow these instructions at home: For the time period you were told by your health care provider:  Rest.  Do not participate in activities where you could fall or become injured.  Do not drive or use machinery.  Do not drink alcohol.  Do not take sleeping pills or medicines that cause drowsiness.  Do not make important decisions or sign legal documents.  Do not take care of children on your own.      Eating and drinking  Follow the diet recommended by your health care provider.  Drink enough fluid to keep your urine pale yellow.  If you vomit: ? Drink water, juice, or soup when you can drink without vomiting. ? Make sure you have little or no nausea before eating solid foods.   General instructions  Take over-the-counter and prescription medicines only as told by your health care provider.  Have a responsible adult stay with you for the time you are told. It is important to have someone help care for you until you are awake and alert.  Do  not smoke.  Keep all follow-up visits as told by your health care provider. This is important. Contact a health care provider if:  You are still sleepy or having trouble with balance after 24 hours.  You feel light-headed.  You keep feeling nauseous or you keep vomiting.  You develop a rash.  You have a fever.  You have redness or swelling around the IV site. Get help right away if:  You have trouble breathing.  You have new-onset confusion at home. Summary  After the procedure, it is common to feel sleepy, have impaired judgment, or feel nauseous if you eat too soon.  Rest after you get home. Know the things you should not do after the procedure.  Follow the diet  recommended by your health care provider and drink enough fluid to keep your urine pale yellow.  Get help right away if you have trouble breathing or new-onset confusion at home. This information is not intended to replace advice given to you by your health care provider. Make sure you discuss any questions you have with your health care provider. Document Revised: 01/31/2020 Document Reviewed: 08/29/2019 Elsevier Patient Education  2021 [employer].

## 2020-12-11 NOTE — Consult Note (Signed)
Chief Complaint: Patient was seen in consultation today for image guided liver lesion biopsy  Referring Physician(s): [place]  Supervising Physician: Corrie Mckusick  Patient Status: [facility] - Out-pt  History of Present Illness: [name] Riley is a 70 y.o. female past medical history of coronary artery disease/ prior MI 2001, hyperlipidemia, hypertension, hypothyroidism and metastatic left breast carcinoma, initially diagnosed in 2013 with prior bilateral mastectomies,chemoradiation and relapse in 2017. She is currently on abemaciclib with letrozole along with Herceptin, Avastin and Xgeva.  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 She presents today for image guided liver lesion biopsy for further evaluation.   Past Medical History:  Diagnosis Date  . Allergy   . Breast cancer ([place]) 08/25/2011   L , invasive ductal carcinoma, ER/PR +,HER2 -  . Cancer [facility])    left breast cancer  . Coronary artery disease 2001  . Heart attack [facility]) 09/2000   Sep 25, 2000  --no intervention  . History of chemotherapy comp. 08/22/2012   4 cycles of FEC and $ cycles of Taxotere  . Hyperlipidemia   . Hypertension   . Hypothyroidism   . PONV (postoperative nausea and vomiting)    gets sick from anesthesia  . Status post radiation therapy 07/09/12 - 08/22/2012   Left Breast, 60.4 gray    Past Surgical History:  Procedure Laterality Date  . ABDOMINAL HYSTERECTOMY  1998   TAH, oophorectomy  . APPENDECTOMY  1970  . BREAST CYST EXCISION Left 07/29/2020   Procedure: WIDE EXCISION OF LEFT MASTECTOMY INCISION;  Surgeon: Donnie Mesa, MD;  Location: [place];  Service: General;  Laterality: Left;  . BREAST SURGERY  1998   removal of benign lump in rt breast  . BREAST SURGERY  11/14/11   right simple mastectomy, left mrm  .  INCISION AND DRAINAGE OF WOUND Left 07/01/2017   Procedure: IRRIGATION AND DEBRIDEMENT CHEST WALL ABSCESS;  Surgeon: Donnie Mesa, MD;  Location: [facility];  Service: General;  Laterality: Left;  Marland Kitchen MASS EXCISION Left 06/22/2017   Procedure: EXCISION OF CHEST WALL MASSES;  Surgeon: Donnie Mesa, MD;  Location: [place];  Service: General;  Laterality: Left;  Marland Kitchen MASTECTOMY Bilateral    for left breast cancer  . OVARIAN CYST SURGERY Right 1970  . PORT-A-CATH REMOVAL  08/30/2012   Procedure: REMOVAL PORT-A-CATH;  Surgeon: Imogene Burn. [name], MD;  Location: [place];  Service: General;  Laterality: Right;  port removal  . PORTACATH PLACEMENT  11/14/2011   Procedure: INSERTION PORT-A-CATH;  Surgeon: Imogene Burn. [name], MD;  Location: [place];  Service: General;  Laterality: Right;  . PORTACATH PLACEMENT N/A 04/25/2016   Procedure: INSERTION PORT-A-CATH LEFT CHEST;  Surgeon: Donnie Mesa, MD;  Location: [place];  Service: General;  Laterality: N/A;  . skin tags  05/09/1997   left axillary left neck skin tags  . TONSILLECTOMY  1968    Allergies: Cantaloupe extract allergy skin test, Contrast media [iodinated diagnostic agents], Pravastatin, Zosyn [piperacillin sod-tazobactam so], and Latex  Medications: Prior to Admission medications   Medication Sig Start Date End Date Taking? Authorizing Provider  acetaminophen (TYLENOL) 500 MG tablet Take 1,000 mg by mouth every 6 (six) hours as needed for moderate pain.   Yes [provider]  Ascorbic Acid (VITAMIN C PO) Take by mouth.   Yes [provider]  cholecalciferol (VITAMIN D) 1000 units  tablet Take 1 tablet (1,000 Units total) by mouth daily. 04/13/18  Yes Nicholas Lose, MD  dexamethasone (DECADRON) 2 MG tablet TAKE TWO TABLETS BY MOUTH DAILY 11/27/20  Yes Vaslow, Acey Lav, MD  letrozole [facility]) 2.5 MG tablet TAKE ONE (1) TABLET EACH DAY 05/04/20  Yes Nicholas Lose, MD  levETIRAcetam (KEPPRA) 500 MG tablet Take  1 tablet (500 mg total) by mouth 2 (two) times daily. 11/09/20  Yes Vaslow, Acey Lav, MD  levothyroxine (SYNTHROID) 100 MCG tablet TAKE ONE TABLET EACH MORNING BEFORE BREAKFAST 11/12/20  Yes Stacks, Cletus Gash, MD  metoprolol succinate (TOPROL-XL) 100 MG 24 hr tablet TAKE ONE (1) TABLET EACH DAY 10/22/20  Yes Stacks, Cletus Gash, MD  Multiple Vitamins-Minerals (MULTIVITAMIN WITH MINERALS) tablet Take 1 tablet by mouth daily.   Yes [provider]  rosuvastatin (CRESTOR) 5 MG tablet Take 1 tablet by mouth 2 (two) times daily.  04/10/20  Yes [provider]  triamcinolone (KENALOG) 0.025 % ointment Apply 1 application topically 2 (two) times daily. 08/07/20  Yes Nicholas Lose, MD  valsartan (DIOVAN) 80 MG tablet TAKE ONE (1) TABLET EACH DAY 09/01/20  Yes Stacks, Cletus Gash, MD  VERZENIO 100 MG tablet TAKE 1 TABLET (100 MG TOTAL) BY MOUTH 2 (TWO) TIMES DAILY. SWALLOW TABLETS WHOLE. DO NOT CHEW, CRUSH, OR SPLIT TABLETS BEFORE SWALLOWING. 11/03/20  Yes Nicholas Lose, MD  Docusate Sodium (DSS) 100 MG CAPS Take by mouth.    [provider]  lidocaine-prilocaine (EMLA) cream APPLY TOPICALLY AS NEEDED FOR PORT ACCESS 05/27/19   Nicholas Lose, MD  ondansetron (ZOFRAN-ODT) 4 MG disintegrating tablet TAKE 1 TABLET EVERY 6 HOURS AS NEEDED FOR NAUSEA AND VOMITING 11/21/19   Nicholas Lose, MD  predniSONE (DELTASONE) 50 MG tablet Prednisone 62m PO 13 hours, 7 hours, and 1 hour before CT scan. 10/30/20   GNicholas Lose MD     Family History  Problem Relation Age of Onset  . Hypertension Maternal Grandmother   . Diabetes Maternal Grandmother   . Cancer Father 754      lung cancer and Prostate Cancer  . Hypertension Mother   . Cancer Paternal Aunt        ovarian  . Cancer Cousin        breast, paternal cousin  . Cancer Paternal Uncle        stomach  . Cancer Paternal Grandfather        Esophagus  . Colon cancer Neg Hx     Social History   Socioeconomic History  . Marital status: Married     Spouse name: Not on file  . Number of children: 3  . Years of education: Not on file  . Highest education level: High school graduate  Occupational History  . Occupation: Works at [employer] Tobacco Use  . Smoking status: Never Smoker  . Smokeless tobacco: Never Used  Vaping Use  . Vaping Use: Never used  Substance and Sexual Activity  . Alcohol use: No  . Drug use: No  . Sexual activity: Yes    Birth control/protection: Surgical    Comment: menarche 157 Parity age 70 GG1 P3, 1 miscarriage,  HRT x 5-10 yrs, Mild Hot Flashes  Other Topics Concern  . Not on file  Social History Narrative   Lives at home.   Social Determinants of Health   Financial Resource Strain: Not on file  Food Insecurity: Not on file  Transportation Needs: Not on file  Physical Activity: Not on file  Stress:  Not on file  Social Connections: Not on file      Review of Systems denies fever, headache, chest pain, dyspnea, cough, abdominal pain, nausea, vomiting or bleeding; does have some bruising and lower extremity edema, occ back pain; she has had prior seizures  Vital Signs: BP (!) 167/82   Pulse 69   Temp 98.3 F (36.8 C) (Oral)   Resp 16   Ht 5' 8"  (1.727 m)   Wt 150 lb (68 kg)   SpO2 99%   BMI 22.81 kg/m   Physical Exam awake, alert. Chest clear to auscultation bilaterally. Lateral left chest wall Port-A-Cath noted. Heart with regular rate /rhythm. Abdomen soft, positive bowel sounds, nontender. Some ecchymoses noted as well as edema bilateral lower extremities  Imaging: CT CHEST ABDOMEN PELVIS W CONTRAST  Result Date: 11/26/2020 CLINICAL DATA:  Restaging metastatic breast cancer. EXAM: CT CHEST, ABDOMEN, AND PELVIS WITH CONTRAST TECHNIQUE: Multidetector CT imaging of the chest, abdomen and pelvis was performed following the standard protocol during bolus administration of intravenous contrast. CONTRAST:  179m OMNIPAQUE IOHEXOL 300 MG/ML  SOLN COMPARISON:  CT scan 05/25/2020  FINDINGS: CT CHEST FINDINGS Cardiovascular: The heart is normal in size. No pericardial effusion. Stable mild tortuosity and ectasia of the thoracic aorta. No focal aneurysm or dissection. Stable scattered coronary artery calcifications. Mediastinum/Nodes: No mediastinal or hilar mass or lymphadenopathy. The esophagus is grossly normal. Lungs/Pleura: No worrisome pulmonary nodules to suggest pulmonary metastatic disease. Linear scarring type changes are noted at the right lung base and also in the anterior aspect of the left upper lobe likely related to radiation change. No acute pulmonary findings. No pleural effusions or pleural nodules. Musculoskeletal: Stable sclerotic metastatic disease. No new or progressive findings. No pathologic fracture or spinal canal compromise. CT ABDOMEN PELVIS FINDINGS Hepatobiliary: New hepatic lesions worrisome for hepatic metastatic disease. Vague segment 3 lesion measures approximately 16 mm on image 60/2. 12 mm segment 6 lesion on image 59/2. Peripheral 23 mm lesion in segment 6 on image number 62/2. Small gallstones are noted the gallbladder. Normal caliber common bile duct. Pancreas: No mass, inflammation or ductal dilatation. Spleen: Normal size.  No focal lesions. Adrenals/Urinary Tract: The adrenal glands are unremarkable. Bilateral renal cysts. No worrisome renal lesions or hydroureteronephrosis. The bladder is unremarkable. Stomach/Bowel: The stomach, duodenum, small bowel and colon are unremarkable. No acute inflammatory changes, mass lesions or obstructive findings. Vascular/Lymphatic: The aorta is normal in caliber. No dissection. The branch vessels are patent. The major venous structures are patent. No mesenteric or retroperitoneal mass or adenopathy. Small scattered lymph nodes are noted. Reproductive: Surgically absent. Other: No pelvic mass or adenopathy. No free pelvic fluid collections. No inguinal mass or adenopathy. No abdominal wall hernia or subcutaneous  lesions. Musculoskeletal: Stable diffuse and extensive sclerotic metastatic bone disease. No new lesions or progressive findings. No pathologic fractures or spinal canal compromise. IMPRESSION: 1. New hepatic lesions worrisome for hepatic metastatic disease. 2. Stable diffuse and extensive sclerotic metastatic bone disease. No new or progressive findings. 3. No findings for pulmonary metastatic disease. 4. Cholelithiasis. 5. Aortic atherosclerosis. Aortic Atherosclerosis (ICD10-I70.0). Electronically Signed   By: PMarijo SanesM.D.   On: 11/26/2020 15:24   ECHOCARDIOGRAM COMPLETE  Result Date: 11/17/2020    ECHOCARDIOGRAM REPORT   Patient Name:   CKASEE HANTZPRIDDY Date of Exam: 11/17/2020 Medical Rec #:  [id number]     Height:       68.0 in Accession #:    [id number]  Weight:       154.5 lb Date of Birth:  26-Oct-1950      BSA:          1.831 m Patient Age:    86 years       BP:           138/75 mmHg Patient Gender: F              HR:           71 bpm. Exam Location:  Outpatient Procedure: 2D Echo, Cardiac Doppler, Color Doppler and Strain Analysis Indications:    Chemo evaluation  History:        Patient has prior history of Echocardiogram examinations, most                 recent 04/07/2020. Risk Factors:Hypertension and Dyslipidemia.                 Breast cancer, mastectomy.  Sonographer:    Dustin Flock Referring Phys: [id number] Nicholas Lose  Sonographer Comments: Image acquisition challenging due to mastectomy. Global longitudinal strain was attempted. IMPRESSIONS  1. Left ventricular ejection fraction, by estimation, is 55 to 60%. The left ventricle has normal function. The left ventricle has no regional wall motion abnormalities. Left ventricular diastolic parameters are indeterminate.  2. Right ventricular systolic function is normal. The right ventricular size is mildly enlarged. There is normal pulmonary artery systolic pressure.  3. Right atrial size was mildly dilated.  4. The mitral valve is normal  in structure. Mild mitral valve regurgitation. No evidence of mitral stenosis.  5. The aortic valve is tricuspid. There is mild calcification of the aortic valve. Aortic valve regurgitation is mild.  6. The inferior vena cava is normal in size with greater than 50% respiratory variability, suggesting right atrial pressure of 3 mmHg. Comparison(s): No significant change from prior study. Conclusion(s)/Recommendation(s): Otherwise normal echocardiogram, with minor abnormalities described in the report. No regional wall motion abnormalities seen, but lateral wall again incompletely visualized. Recommend echo contrast with future studies. FINDINGS  Left Ventricle: Left ventricular ejection fraction, by estimation, is 55 to 60%. The left ventricle has normal function. The left ventricle has no regional wall motion abnormalities. Global longitudinal strain performed but not reported based on interpreter judgement due to suboptimal tracking. The left ventricular internal cavity size was normal in size. There is borderline left ventricular hypertrophy. Left ventricular diastolic parameters are indeterminate. Right Ventricle: The right ventricular size is mildly enlarged. No increase in right ventricular wall thickness. Right ventricular systolic function is normal. There is normal pulmonary artery systolic pressure. The tricuspid regurgitant velocity is 2.51  m/s, and with an assumed right atrial pressure of 3 mmHg, the estimated right ventricular systolic pressure is 63.0 mmHg. Left Atrium: Left atrial size was normal in size. Right Atrium: Right atrial size was mildly dilated. Pericardium: There is no evidence of pericardial effusion. Mitral Valve: The mitral valve is normal in structure. Mild to moderate mitral annular calcification. Mild mitral valve regurgitation. No evidence of mitral valve stenosis. Tricuspid Valve: The tricuspid valve is normal in structure. Tricuspid valve regurgitation is trivial. No evidence of  tricuspid stenosis. Aortic Valve: The aortic valve is tricuspid. There is mild calcification of the aortic valve. Aortic valve regurgitation is mild. Aortic regurgitation PHT measures 345 msec. Pulmonic Valve: The pulmonic valve was not well visualized. Pulmonic valve regurgitation is trivial. Aorta: The aortic root, ascending aorta, aortic arch and descending aorta are all structurally normal, with  no evidence of dilitation or obstruction. Venous: The inferior vena cava is normal in size with greater than 50% respiratory variability, suggesting right atrial pressure of 3 mmHg. IAS/Shunts: The atrial septum is grossly normal.  LEFT VENTRICLE PLAX 2D LVIDd:         5.00 cm  Diastology LVIDs:         3.30 cm  LV e' medial:    5.11 cm/s LV PW:         1.10 cm  LV E/e' medial:  15.1 LV IVS:        1.00 cm  LV e' lateral:   6.42 cm/s LVOT diam:     1.80 cm  LV E/e' lateral: 12.0 LV SV:         59 LV SV Index:   32 LVOT Area:     2.54 cm  RIGHT VENTRICLE RV Basal diam:  3.20 cm RV S prime:     13.20 cm/s TAPSE (M-mode): 2.2 cm LEFT ATRIUM             Index       RIGHT ATRIUM           Index LA diam:        3.60 cm 1.97 cm/m  RA Area:     14.80 cm LA Vol (A2C):   25.1 ml 13.71 ml/m RA Volume:   44.50 ml  24.30 ml/m LA Vol (A4C):   46.3 ml 25.28 ml/m LA Biplane Vol: 36.4 ml 19.88 ml/m  AORTIC VALVE LVOT Vmax:   112.00 cm/s LVOT Vmean:  67.000 cm/s LVOT VTI:    0.230 m AI PHT:      345 msec  AORTA Ao Root diam: 2.90 cm MITRAL VALVE               TRICUSPID VALVE MV Area (PHT): 3.31 cm    TR Peak grad:   25.2 mmHg MV Decel Time: 229 msec    TR Vmax:        251.00 cm/s MV E velocity: 76.95 cm/s MV A velocity: 41.76 cm/s  SHUNTS MV E/A ratio:  1.84        Systemic VTI:  0.23 m                            Systemic Diam: 1.80 cm Buford Dresser MD Electronically signed by Buford Dresser MD Signature Date/Time: 11/17/2020/5:51:41 PM    Final     Labs:  CBC: Recent Labs    09/04/20 0827 10/02/20 0844  10/30/20 1350 11/27/20 0924  WBC 2.7* 2.5* 6.5 7.5  HGB 9.6* 10.0* 10.7* 10.8*  HCT 28.8* 29.9* 31.5* 32.2*  PLT 110* 102* 107* 108*    COAGS: No results for input(s): INR, APTT in the last 8760 hours.  BMP: Recent Labs    05/15/20 0909 06/12/20 0955 07/10/20 0820 08/07/20 0830 09/02/20 0806 09/04/20 0827 10/02/20 0844 10/30/20 1350 11/27/20 0924  NA 141 138 139   < > 141 139 140 139 137  K 3.5 3.7 3.6   < > 4.5 3.7 3.6 3.8 3.5  CL 105 102 105   < > 102 104 104 104 104  CO2 27 29 30    < > 25 28 28 26 29   GLUCOSE 117* 94 99   < > 88 93 116* 91 107*  BUN 16 16 16    < > 16 15 18  30* 28*  CALCIUM 10.4* 10.7* 9.4   < >  9.7 9.3 9.7 9.6 8.9  CREATININE 0.90 0.95 0.82   < > 0.91 0.84 0.92 0.95 0.95  GFRNONAA >60 >60 >60   < > 65 >60 >60 >60 >60  GFRAA >60 >60 >60  --  76  --   --   --   --    < > = values in this interval not displayed.    LIVER FUNCTION TESTS: Recent Labs    09/04/20 0827 10/02/20 0844 10/30/20 1350 11/27/20 0924  BILITOT 0.5 0.5 0.6 0.5  AST 40 48* 50* 85*  ALT 21 37 48* 116*  ALKPHOS 161* 176* 157* 144*  PROT 6.7 6.8 6.8 6.1*  ALBUMIN 3.5 3.5 3.5 3.1*    TUMOR MARKERS: No results for input(s): AFPTM, CEA, CA199, CHROMGRNA in the last 8760 hours.  Assessment and Plan: 70 y.o. female past medical history of coronary artery disease/ prior MI 2001, hyperlipidemia, hypertension, hypothyroidism and metastatic left breast carcinoma, initially diagnosed in 2013 with prior bilateral mastectomies,chemoradiation and relapse in 2017. She is currently on abemaciclib with letrozole along with Herceptin, Avastin and Xgeva.  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 She presents today for image guided liver lesion biopsy for further evaluation.Risks and benefits of procedure was discussed with the patient   including, but not limited to bleeding, infection, damage to adjacent structures or low yield requiring additional tests.  All of the questions were answered and there is agreement to proceed.  Consent signed and in chart.      Thank you for this interesting consult.  I greatly enjoyed meeting SHANAI LARTIGUE and look forward to participating in their care.  A copy of this report was sent to the requesting provider on this date.  Electronically Signed: D. Rowe Robert, PA-C 12/11/2020, 12:06 PM   I spent a total of  25 minutes   in face to face in clinical consultation, greater than 50% of which was counseling/coordinating care for image guided liver lesion biopsy

## 2020-12-11 NOTE — Procedures (Signed)
Interventional Radiology Procedure Note  Procedure: US guided biopsy of liver mass, left liver Complications: None EBL: None Recommendations: - Bedrest 2 hours.   - Routine wound care - Follow up pathology - Advance diet   Signed,  Corrie Mckusick, DO

## 2020-12-15 ENCOUNTER — Telehealth: Payer: Self-pay

## 2020-12-15 MED FILL — VERZENIO 100 MG TAB: 100 | 28 days supply | Qty: 56 | Fill #1

## 2020-12-17 LAB — SURGICAL PATHOLOGY

## 2020-12-24 NOTE — Progress Notes (Signed)
Patient Care Team: [name], MD as PCP - General (Family Medicine) Minus Breeding, MD as PCP - Cardiology (Cardiology) Nicholas Lose, MD as Consulting Physician (Hematology and Oncology)  DIAGNOSIS:    ICD-10-CM   1. Metastatic breast cancer ([place])  C50.919   2. Malignant neoplasm of upper-outer quadrant of left breast in female, estrogen receptor positive ([place])  C50.412    Z17.0     SUMMARY OF ONCOLOGIC HISTORY: Oncology History  Breast cancer of upper-outer quadrant of left female breast ([place])  11/14/2011 Surgery   Bilateral mastectomy, prophylactic on the right, left breast IDC 3/18 lymph nodes positive with extracapsular extension ER 89%, PR 81%, HER-2 negative, Ki-67 79% T2 N1 A. stage IIB   12/13/2011 - 06/28/2012 Chemotherapy   4 cycles of FEC followed by 4 cycles of Taxotere   07/17/2012 - 08/22/2012 Radiation Therapy   Adjuvant radiation therapy   08/22/2012 - 03/16/2016 Anti-estrogen oral therapy   Arimidex 1 mg daily   03/16/2016 Relapse/Recurrence   Subcutaneous nodule excision left chest: Infiltrating carcinoma breast primary, ER positive, PR negative   03/29/2016 Imaging   CT CAP and bone scan: Lytic lesions T8 vertebral, T1 posterior element, subcutaneous nodule left lateral chest wall, nonspecific lung nodules; Bone scan: Mets to kull, left humerus, left eighth rib, T7/T8, sternum, left acetabulum   04/28/2016 - 06/17/2017 Chemotherapy   Herceptin, lapatinib, Faslodex, Zometa every 4 weeks, lapatinib discontinued in September 2018 due to elevation of LFTs   06/22/2017 Relapse/Recurrence   Surgical excision:Soft tissue mass left lateral chest wall primary breast cancer, soft tissue mass left medial chest wall breast cancer, tumor is within the dermis extending to the subcutaneous adipose tissue and involves portions of skeletal muscle   06/22/2017 Cancer Staging   Staging form: Breast, AJCC 7th Edition - Pathologic stage from 06/22/2017: Stage IV (TX, NX, M1) - Signed  by Nicholas Lose, MD on 12/27/2019   08/2017 - 02/16/2018 Chemotherapy   Faslodex with Herceptin and Perjeta along with Zometa every 4 weeks   03/02/2018 - 05/25/2018 Chemotherapy   Kadcyla   05/11/2018 Imaging   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 Associated vasogenic edema within the underlying right cerebral hemisphere without significant midline shift. Signal abnormality throughout the visualized bone marrow, compatible with osseous metastatic disease.    06/04/2018 Imaging   CT CAP: Right lower lobe lung nodule 7 mm (was 5 mm); multiple bone metastases throughout the spine and ribs sternum scapula and humerus, slightly increased lower thoracic mets, right renal lesion 2.5 cm (was 1.2 cm) right femur met increased from 2.1 cm to 2.9 cm   06/15/2018 -  Anti-estrogen oral therapy   Abemaciclib, Herceptin, letrozole, Xgeva   06/07/2019 Imaging   Progression of brain metastases,2 discrete dural-based lesions involving the posterior right frontal lobe. The more anterior lesion now measures 16.5 x 17.5 x 14 mm. The posterior lesion now measures 9.5 x 13 x 20 mm.  Vasogenic edema of the frontal and parietal lobes   06/21/2019 - 06/27/2019 Radiation Therapy   SRS to the brain   07/19/2020 Relapse/Recurrence   Left mastectomy scar nodule: Biopsy invasive carcinoma ER 60%, PR 0%, HER-2 negative by [facility]   07/29/2020 Surgery   Soft tissue mass left chest wall excision: No evidence of malignancy.   11/27/2020 -  Chemotherapy    Patient is on Treatment Plan: BRAIN GBM BEVACIZUMAB 14D X 6 CYCLES   Patient is on Antibody Plan: BREAST TRASTUZUMAB Q21D    Metastatic breast cancer (  [place])  06/29/2017 Initial Diagnosis   Metastatic breast cancer ([place])   06/15/2018 -  Chemotherapy    Patient is on Treatment Plan: BRAIN GBM BEVACIZUMAB 14D X 6 CYCLES   Patient is on Antibody Plan: BREAST TRASTUZUMAB Q21D    Brain metastasis ([place])  07/13/2018 Initial Diagnosis   Brain  metastasis ([place])   11/27/2020 -  Chemotherapy    Patient is on Treatment Plan: BRAIN GBM BEVACIZUMAB 14D X 6 CYCLES   Patient is on Antibody Plan: BREAST TRASTUZUMAB Q21D      CHIEF COMPLIANT: Follow-up of metastatic breast cancer, liver biopsy results  INTERVAL HISTORY: [name] Riley is a 70 y.o. with above-mentioned history of metastatic breast cancer currently on abemaciclib with letrozole along with Herceptin, Avastin, andXgeva. Liver biopsy on 12/11/20 showed metastatic carcinoma consistent with mammary carcinoma. She presents to the clinic today to review the pathology report and discuss further treatment.  Her major complaint today is insomnia related to dexamethasone.  She also has significant increased appetite.  Today she is getting Avastin infusion as well.  She has 1 more Avastin on 01/15/2021 and after that she is likely to undergo brain imaging.  ALLERGIES:  is allergic to cantaloupe extract allergy skin test, contrast media [iodinated diagnostic agents], pravastatin, zosyn [piperacillin sod-tazobactam so], and latex.  MEDICATIONS:  Current Outpatient Medications  Medication Sig Dispense Refill  . furosemide (LASIX) 20 MG tablet Take 1 tablet (20 mg total) by mouth daily. 30 tablet 1  . acetaminophen (TYLENOL) 500 MG tablet Take 1,000 mg by mouth every 6 (six) hours as needed for moderate pain.    . Ascorbic Acid (VITAMIN C PO) Take by mouth.    . cholecalciferol (VITAMIN D) 1000 units tablet Take 1 tablet (1,000 Units total) by mouth daily.    Marland Kitchen dexamethasone (DECADRON) 2 MG tablet TAKE TWO TABLETS BY MOUTH DAILY 60 tablet 0  . Docusate Sodium (DSS) 100 MG CAPS Take by mouth.    . letrozole (FEMARA) 2.5 MG tablet TAKE ONE (1) TABLET EACH DAY 90 tablet 3  . levETIRAcetam (KEPPRA) 500 MG tablet Take 1 tablet (500 mg total) by mouth 2 (two) times daily. 60 tablet 3  . levothyroxine (SYNTHROID) 100 MCG tablet TAKE ONE TABLET EACH MORNING BEFORE BREAKFAST 90 tablet 2  .  lidocaine-prilocaine (EMLA) cream APPLY TOPICALLY AS NEEDED FOR PORT ACCESS 30 g 3  . metoprolol succinate (TOPROL-XL) 100 MG 24 hr tablet TAKE ONE (1) TABLET EACH DAY 90 tablet 0  . Multiple Vitamins-Minerals (MULTIVITAMIN WITH MINERALS) tablet Take 1 tablet by mouth daily.    . ondansetron (ZOFRAN-ODT) 4 MG disintegrating tablet TAKE 1 TABLET EVERY 6 HOURS AS NEEDED FOR NAUSEA AND VOMITING 30 tablet 3  . predniSONE (DELTASONE) 50 MG tablet Prednisone 89m PO 13 hours, 7 hours, and 1 hour before CT scan. 3 tablet 0  . rosuvastatin (CRESTOR) 5 MG tablet Take 1 tablet by mouth 2 (two) times daily.     .Marland Kitchentriamcinolone (KENALOG) 0.025 % ointment Apply 1 application topically 2 (two) times daily. 80 g 1  . valsartan (DIOVAN) 80 MG tablet TAKE ONE (1) TABLET EACH DAY 90 tablet 1  . VERZENIO 100 MG tablet TAKE 1 TABLET (100 MG TOTAL) BY MOUTH 2 (TWO) TIMES DAILY. SWALLOW TABLETS WHOLE. DO NOT CHEW, CRUSH, OR SPLIT TABLETS BEFORE SWALLOWING. 56 tablet 3   No current facility-administered medications for this visit.   Facility-Administered Medications Ordered in Other Visits  Medication Dose Route Frequency Provider Last Rate  Last Admin  . sodium chloride flush (NS) 0.9 % injection 10 mL  10 mL Intracatheter PRN Nicholas Lose, MD   10 mL at 12/25/20 1305    PHYSICAL EXAMINATION: ECOG PERFORMANCE STATUS: 1 - Symptomatic but completely ambulatory  Vitals:   12/25/20 0945  BP: (!) 163/99  Pulse: 79  Resp: 20  Temp: 97.7 F (36.5 C)  SpO2: 100%   Filed Weights   12/25/20 0945  Weight: 165 lb 11.2 oz (75.2 kg)     LABORATORY DATA:  I have reviewed the data as listed CMP Latest Ref Rng & Units 12/25/2020 11/27/2020 10/30/2020  Glucose 70 - 99 mg/dL 79 107(H) 91  BUN 8 - 23 mg/dL 20 28(H) 30(H)  Creatinine 0.44 - 1.00 mg/dL 0.80 0.95 0.95  Sodium 135 - 145 mmol/L 139 137 139  Potassium 3.5 - 5.1 mmol/L 3.4(L) 3.5 3.8  Chloride 98 - 111 mmol/L 104 104 104  CO2 22 - 32 mmol/L 29 29 26    Calcium 8.9 - 10.3 mg/dL 9.0 8.9 9.6  Total Protein 6.5 - 8.1 g/dL 6.2(L) 6.1(L) 6.8  Total Bilirubin 0.3 - 1.2 mg/dL 0.6 0.5 0.6  Alkaline Phos 38 - 126 U/L 174(H) 144(H) 157(H)  AST 15 - 41 U/L 94(H) 85(H) 50(H)  ALT 0 - 44 U/L 112(H) 116(H) 48(H)    Lab Results  Component Value Date   WBC 5.2 12/25/2020   HGB 10.8 (L) 12/25/2020   HCT 31.4 (L) 12/25/2020   MCV 106.4 (H) 12/25/2020   PLT 89 (L) 12/25/2020   NEUTROABS 2.5 12/25/2020    ASSESSMENT & PLAN:  Breast cancer of upper-outer quadrant of left female breast ([place]) Current treatment: Abemaciclibwith letrozole along with Herceptin(to be given every 4 weeks)and Xgeva started 06/15/2018  CT CAP 05/25/2020: No significant interval change in the bone metastases. 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 Mickeal Skinner is adding Avastin to the treatment. Today is cycle 1  CTCAP 11/26/2020: New hepatic lesions worrisome for hepatic mets (1.6 cm, 1.2 cm, 2.3 cm)  Recommendation: 1. Biopsy of the liver lesion.  12/11/2020: Metastatic carcinoma, ER 75% week to moderate, PR 0%, Ki-67 25%, HER-2 equivocal by IHC, FISH positive ratio 2.97, copy #5.2 2. Change treatment plan: Kadcyla along with abemaciclib and letrozole  Kadcyla counseling: Discussed risks and benefits of Kadcyla.  She is agreeable to proceed. Return to clinic in 3 weeks to start [place].     No orders of the defined types were placed in this encounter.  The patient has a good understanding of the overall plan. she agrees with it. she will call with any problems that may develop before the next visit here.  Total time spent: 30 mins including face to face time and time spent for planning, charting and coordination of care  Rulon Eisenmenger, MD, MPH 12/25/2020  I, Molly Dorshimer, am acting  as scribe for Dr. Nicholas Lose.  I have reviewed the above documentation for accuracy and completeness, and I agree with the above.

## 2020-12-25 ENCOUNTER — Inpatient Hospital Stay (HOSPITAL_BASED_OUTPATIENT_CLINIC_OR_DEPARTMENT_OTHER): Payer: Medicare Other | Admitting: Hematology and Oncology

## 2020-12-25 ENCOUNTER — Inpatient Hospital Stay: Payer: Medicare Other

## 2020-12-25 ENCOUNTER — Other Ambulatory Visit: Payer: Self-pay | Admitting: Internal Medicine

## 2020-12-25 ENCOUNTER — Inpatient Hospital Stay: Payer: Medicare Other | Attending: Hematology and Oncology

## 2020-12-25 ENCOUNTER — Other Ambulatory Visit: Payer: Self-pay

## 2020-12-25 VITALS — BP 163/99 | HR 79 | Temp 97.7°F | Resp 20 | Ht 68.0 in | Wt 165.7 lb

## 2020-12-25 DIAGNOSIS — Z170 Estrogen receptor positive status [ER+]: Secondary | ICD-10-CM | POA: Diagnosis not present

## 2020-12-25 DIAGNOSIS — C50919 Malignant neoplasm of unspecified site of unspecified female breast: Secondary | ICD-10-CM

## 2020-12-25 DIAGNOSIS — C7931 Secondary malignant neoplasm of brain: Secondary | ICD-10-CM | POA: Diagnosis not present

## 2020-12-25 DIAGNOSIS — C50412 Malignant neoplasm of upper-outer quadrant of left female breast: Secondary | ICD-10-CM

## 2020-12-25 DIAGNOSIS — Z79899 Other long term (current) drug therapy: Secondary | ICD-10-CM | POA: Insufficient documentation

## 2020-12-25 DIAGNOSIS — R569 Unspecified convulsions: Secondary | ICD-10-CM | POA: Diagnosis not present

## 2020-12-25 DIAGNOSIS — Z923 Personal history of irradiation: Secondary | ICD-10-CM | POA: Insufficient documentation

## 2020-12-25 DIAGNOSIS — C7951 Secondary malignant neoplasm of bone: Secondary | ICD-10-CM | POA: Insufficient documentation

## 2020-12-25 DIAGNOSIS — Z5111 Encounter for antineoplastic chemotherapy: Secondary | ICD-10-CM | POA: Diagnosis not present

## 2020-12-25 DIAGNOSIS — I6789 Other cerebrovascular disease: Secondary | ICD-10-CM

## 2020-12-25 DIAGNOSIS — Z9221 Personal history of antineoplastic chemotherapy: Secondary | ICD-10-CM | POA: Diagnosis not present

## 2020-12-25 DIAGNOSIS — Z95828 Presence of other vascular implants and grafts: Secondary | ICD-10-CM

## 2020-12-25 LAB — CMP (CANCER CENTER ONLY)
ALT: 112 U/L — ABNORMAL HIGH (ref 0–44)
AST: 94 U/L — ABNORMAL HIGH (ref 15–41)
Albumin: 3.1 g/dL — ABNORMAL LOW (ref 3.5–5.0)
Alkaline Phosphatase: 174 U/L — ABNORMAL HIGH (ref 38–126)
Anion gap: 6 (ref 5–15)
BUN: 20 mg/dL (ref 8–23)
CO2: 29 mmol/L (ref 22–32)
Calcium: 9 mg/dL (ref 8.9–10.3)
Chloride: 104 mmol/L (ref 98–111)
Creatinine: 0.8 mg/dL (ref 0.44–1.00)
GFR, Estimated: >60 mL/min (ref >60)
Glucose, Bld: 79 mg/dL (ref 70–99)
Potassium: 3.4 mmol/L — ABNORMAL LOW (ref 3.5–5.1)
Sodium: 139 mmol/L (ref 135–145)
Total Bilirubin: 0.6 mg/dL (ref 0.3–1.2)
Total Protein: 6.2 g/dL — ABNORMAL LOW (ref 6.5–8.1)

## 2020-12-25 LAB — CBC WITH DIFFERENTIAL (CANCER CENTER ONLY)
Abs Immature Granulocytes: 0.08 10*3/uL — ABNORMAL HIGH (ref 0.00–0.07)
Basophils Absolute: 0 10*3/uL (ref 0.0–0.1)
Basophils Relative: 0 %
Eosinophils Absolute: 0 10*3/uL (ref 0.0–0.5)
Eosinophils Relative: 1 %
HCT: 31.4 % — ABNORMAL LOW (ref 36.0–46.0)
Hemoglobin: 10.8 g/dL — ABNORMAL LOW (ref 12.0–15.0)
Immature Granulocytes: 2 %
Lymphocytes Relative: 41 %
Lymphs Abs: 2.1 10*3/uL (ref 0.7–4.0)
MCH: 36.6 pg — ABNORMAL HIGH (ref 26.0–34.0)
MCHC: 34.4 g/dL (ref 30.0–36.0)
MCV: 106.4 fL — ABNORMAL HIGH (ref 80.0–100.0)
Monocytes Absolute: 0.4 10*3/uL (ref 0.1–1.0)
Monocytes Relative: 8 %
Neutro Abs: 2.5 10*3/uL (ref 1.7–7.7)
Neutrophils Relative %: 48 %
Platelet Count: 89 10*3/uL — ABNORMAL LOW (ref 150–400)
RBC: 2.95 MIL/uL — ABNORMAL LOW (ref 3.87–5.11)
RDW: 13.9 % (ref 11.5–15.5)
WBC Count: 5.2 10*3/uL (ref 4.0–10.5)
nRBC: 0 % (ref 0.0–0.2)

## 2020-12-25 LAB — TOTAL PROTEIN, URINE DIPSTICK: Protein, ur: NEGATIVE mg/dL

## 2020-12-25 MED ORDER — TRASTUZUMAB-DKST CHEMO 150 MG IV SOLR
6.0000 mg/kg | Freq: Once | INTRAVENOUS | Status: AC
  Administered 2020-12-25: 399 mg via INTRAVENOUS
  Filled 2020-12-25: qty 19

## 2020-12-25 MED ORDER — DIPHENHYDRAMINE HCL 25 MG PO CAPS
ORAL_CAPSULE | ORAL | Status: AC
  Filled 2020-12-25: qty 2

## 2020-12-25 MED ORDER — SODIUM CHLORIDE 0.9 % IV SOLN
Freq: Once | INTRAVENOUS | Status: AC
  Filled 2020-12-25: qty 250

## 2020-12-25 MED ORDER — HEPARIN SOD (PORK) LOCK FLUSH 100 UNIT/ML IV SOLN
500.0000 [IU] | Freq: Once | INTRAVENOUS | Status: AC | PRN
  Administered 2020-12-25: 500 [IU]
  Filled 2020-12-25: qty 5

## 2020-12-25 MED ORDER — DIPHENHYDRAMINE HCL 25 MG PO CAPS
50.0000 mg | ORAL_CAPSULE | Freq: Once | ORAL | Status: AC
  Administered 2020-12-25: 50 mg via ORAL

## 2020-12-25 MED ORDER — ACETAMINOPHEN 325 MG PO TABS
ORAL_TABLET | ORAL | Status: AC
  Filled 2020-12-25: qty 2

## 2020-12-25 MED ORDER — SODIUM CHLORIDE 0.9 % IV SOLN
10.0000 mg/kg | Freq: Once | INTRAVENOUS | Status: AC
  Administered 2020-12-25: 700 mg via INTRAVENOUS
  Filled 2020-12-25: qty 16

## 2020-12-25 MED ORDER — FUROSEMIDE 20 MG PO TABS: 20.0000 mg | ORAL_TABLET | Freq: Every day | ORAL | 1 refills | Status: DC

## 2020-12-25 MED ORDER — SODIUM CHLORIDE 0.9% FLUSH
10.0000 mL | INTRAVENOUS | Status: DC | PRN
  Administered 2020-12-25: 10 mL
  Filled 2020-12-25: qty 10

## 2020-12-25 MED ORDER — SODIUM CHLORIDE 0.9% FLUSH
10.0000 mL | INTRAVENOUS | Status: DC | PRN
  Administered 2020-12-25: 10 mL via INTRAVENOUS
  Filled 2020-12-25: qty 10

## 2020-12-25 MED ORDER — ACETAMINOPHEN 325 MG PO TABS
650.0000 mg | ORAL_TABLET | Freq: Once | ORAL | Status: AC
  Administered 2020-12-25: 650 mg via ORAL

## 2020-12-25 NOTE — Patient Instructions (Signed)

## 2020-12-25 NOTE — Patient Instructions (Signed)
[facility] Discharge Instructions for Patients Receiving Chemotherapy  Today you received the following chemotherapy agents: trastuzumab-dkst, bevacizumab  To help prevent nausea and vomiting after your treatment, we encourage you to take your nausea medication as directed.   If you develop nausea and vomiting that is not controlled by your nausea medication, call the clinic.   BELOW ARE SYMPTOMS THAT SHOULD BE REPORTED IMMEDIATELY:  *FEVER GREATER THAN 100.5 F  *CHILLS WITH OR WITHOUT FEVER  NAUSEA AND VOMITING THAT IS NOT CONTROLLED WITH YOUR NAUSEA MEDICATION  *UNUSUAL SHORTNESS OF BREATH  *UNUSUAL BRUISING OR BLEEDING  TENDERNESS IN MOUTH AND THROAT WITH OR WITHOUT PRESENCE OF ULCERS  *URINARY PROBLEMS  *BOWEL PROBLEMS  UNUSUAL RASH Items with * indicate a potential emergency and should be followed up as soon as possible.  Feel free to call the clinic should you have any questions or concerns. The clinic phone number is [phone].  Please show the [facility] at check-in to the Emergency Department and triage nurse.

## 2020-12-25 NOTE — Assessment & Plan Note (Signed)
Current treatment: Abemaciclibwith letrozole along with Herceptin(to be given every 4 weeks)and Xgeva started 06/15/2018  CT CAP 05/25/2020: No significant interval change in the bone metastases. 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 Mickeal Skinner is adding Avastin to the treatment. Today is cycle 1  CTCAP 11/26/2020: New hepatic lesions worrisome for hepatic mets (1.6 cm, 1.2 cm, 2.3 cm)  Recommendation: 1. Biopsy of the liver lesion.  12/11/2020: Metastatic carcinoma, ER 75% week to moderate, PR 0%, Ki-67 25%, HER-2 equivocal by IHC, FISH positive ratio 2.97, copy #5.2 2. Change treatment plan: Kadcyla along with Faslodex and anastrozole  Kadcyla counseling: Discussed risks and benefits of Kadcyla.  She is agreeable to proceed. Return to clinic in 1 week to start [place].

## 2020-12-25 NOTE — Progress Notes (Signed)
Per Dr. Mickeal Skinner, ok to treat with avastin with platelets 89.

## 2020-12-28 ENCOUNTER — Telehealth: Payer: Self-pay | Admitting: Hematology and Oncology

## 2020-12-28 NOTE — Telephone Encounter (Signed)
Scheduled per 3/11 los. Called and spoke with pt, confirmed 4/1 appts

## 2021-01-06 ENCOUNTER — Telehealth: Payer: Self-pay

## 2021-01-06 DIAGNOSIS — C7931 Secondary malignant neoplasm of brain: Secondary | ICD-10-CM

## 2021-01-06 NOTE — Telephone Encounter (Signed)
She should decrease decadron to 2mg  daily until we obtain the MRI next month.  I will order and schedule it for April 8th (or thereabouts).     Ventura Sellers, MD

## 2021-01-06 NOTE — Telephone Encounter (Signed)
Pt left a message wanting to know if she is to continue taking Decadron?  I spoke with pt and advised per her 11/09/20 visit with Dr. Mickeal Skinner, she was to continue it until she was started on Avastin. Pt received Avastin on 12/25/20 but pt wants Dr. Mickeal Skinner to confirm this. I advised the pt I would send him a message for confirmation as she requested.

## 2021-01-07 NOTE — Telephone Encounter (Signed)
I have called pt back and left this detailed information on her VM.

## 2021-01-12 ENCOUNTER — Other Ambulatory Visit: Payer: Self-pay | Admitting: Radiation Therapy

## 2021-01-14 ENCOUNTER — Other Ambulatory Visit (HOSPITAL_COMMUNITY): Payer: Self-pay

## 2021-01-14 NOTE — Progress Notes (Signed)
[facility] Progress Note   [name] [id number] 1950/12/28 70 y.o.  [name] Riley is managed by Dr. Nicholas Lose  Actively treated with chemotherapy/immunotherapy/hormonal therapy: yes  Current therapy: Roselee Nova, Zirabev, and Delton See  Last treated: 12/25/2020 (cycle 3, day 1)  Next scheduled appointment with provider: 02/05/2021  Assessment: Plan:    Metastasis to bone ([place])  Brain metastasis ([place])  Malignant neoplasm of upper-outer quadrant of left female breast, unspecified estrogen receptor status ([place])  Malignant neoplasm of upper-outer quadrant of left breast in female, estrogen receptor positive ([place]) - Plan: lidocaine-prilocaine (EMLA) cream  Hypokalemia - Plan: potassium chloride SA (KLOR-CON) 20 MEQ tablet   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 Gutter presents to the clinic today for consideration of cycle 3, day 1 of Kadcyla, [place], and [place].  We will proceed with her treatment today and will have her return as scheduled on 02/05/2021.  She plans on delaying her upcoming brain MRI secondary to a family scheduling conflict.  She plans to complete this around the end of April.  She needs a follow-up appointment with Dr. Mickeal Skinner once the study has been completed.  Hypokalemia: The patient's labs returned today with a potassium of 3.2.  She was given a prescription for K-Lor 20 mEq with instructions to continue this until her return on 02/05/2021.  Please see After Visit Summary for patient specific instructions.  Future Appointments  Date Time Provider [place]  02/01/2021  7:00 AM [facility] CONFERENCE [facility] None  02/05/2021  8:15 AM [facility] LAB [facility] None  02/05/2021  8:30 AM [facility] [place] FLUSH [facility] None  02/05/2021  9:00 AM Michi Herrmann, Lucianne Lei E., PA-C [facility] None  02/05/2021 10:00 AM [facility] INFUSION [facility] None  02/26/2021  9:30 AM [facility]  LAB [facility] None  02/26/2021  9:45 AM [facility] [place] FLUSH [facility] None  02/26/2021 10:15 AM Nicholas Lose, MD [facility] None  02/26/2021 11:15 AM [facility] INFUSION [facility] None    No orders of the defined types were placed in this encounter.      Subjective:   Patient ID:  [name] Riley is a 70 y.o. (DOB 1951-06-14) female.  Chief Complaint: No chief complaint on file.   HPI [name]  is a 69 y.o. female with a diagnosis of a metastatic malignant neoplasm of the left breast with brain and bone metastasis: Ms. Lange presents to the clinic today for consideration of cycle 3, day 1 of Kadcyla, [place], [place].  We will proceed with her treatment today and will have her return as scheduled on 02/05/2021.  She plans on delaying her upcoming brain MRI secondary to a family scheduling conflict.  She plans to complete this around the end of April.  She needs a follow-up appointment with Dr. Mickeal Skinner once the study has been completed. She reports that she has been having bilateral lower extremity edema and has been taking Lasix once daily.  She reports having earache and headache occasionally.  Her energy level is low.  She reports having some abdominal puffiness after having a liver biopsy completed earlier this week.  Medications: I have reviewed the patient's current medications.  Allergies:  Allergies  Allergen Reactions  . Cantaloupe Extract Allergy Skin Test Shortness Of Breath  . Contrast Media [Iodinated Diagnostic Agents] Shortness Of Breath and Rash  . Pravastatin Other (See Comments)    Legs hurt  . Zosyn [Piperacillin Sod-Tazobactam So] Rash and Other (See Comments)  Temperature increase, facial flushing  . Latex Rash    Redness, itch     Past Medical History:  Diagnosis Date  . Allergy   . Breast cancer ([place]) 08/25/2011   L , invasive ductal carcinoma, ER/PR +,HER2 -  . Cancer [facility])    left breast cancer  . Coronary artery disease 2001  . Heart  attack [facility]) 09/2000   Sep 25, 2000  --no intervention  . History of chemotherapy comp. 08/22/2012   4 cycles of FEC and $ cycles of Taxotere  . Hyperlipidemia   . Hypertension   . Hypothyroidism   . PONV (postoperative nausea and vomiting)    gets sick from anesthesia  . Status post radiation therapy 07/09/12 - 08/22/2012   Left Breast, 60.4 gray    Past Surgical History:  Procedure Laterality Date  . ABDOMINAL HYSTERECTOMY  1998   TAH, oophorectomy  . APPENDECTOMY  1970  . BREAST CYST EXCISION Left 07/29/2020   Procedure: WIDE EXCISION OF LEFT MASTECTOMY INCISION;  Surgeon: Donnie Mesa, MD;  Location: [place];  Service: General;  Laterality: Left;  . BREAST SURGERY  1998   removal of benign lump in rt breast  . BREAST SURGERY  11/14/11   right simple mastectomy, left mrm  . INCISION AND DRAINAGE OF WOUND Left 07/01/2017   Procedure: IRRIGATION AND DEBRIDEMENT CHEST WALL ABSCESS;  Surgeon: Donnie Mesa, MD;  Location: [facility];  Service: General;  Laterality: Left;  Marland Kitchen MASS EXCISION Left 06/22/2017   Procedure: EXCISION OF CHEST WALL MASSES;  Surgeon: Donnie Mesa, MD;  Location: [place];  Service: General;  Laterality: Left;  Marland Kitchen MASTECTOMY Bilateral    for left breast cancer  . OVARIAN CYST SURGERY Right 1970  . PORT-A-CATH REMOVAL  08/30/2012   Procedure: REMOVAL PORT-A-CATH;  Surgeon: Imogene Burn. [name], MD;  Location: [place];  Service: General;  Laterality: Right;  port removal  . PORTACATH PLACEMENT  11/14/2011   Procedure: INSERTION PORT-A-CATH;  Surgeon: Imogene Burn. [name], MD;  Location: [place];  Service: General;  Laterality: Right;  . PORTACATH PLACEMENT N/A 04/25/2016   Procedure: INSERTION PORT-A-CATH LEFT CHEST;  Surgeon: Donnie Mesa, MD;  Location: [place];  Service: General;  Laterality: N/A;  . skin tags  05/09/1997   left axillary left neck skin tags  . TONSILLECTOMY  1968    Family History  Problem Relation Age  of Onset  . Hypertension Maternal Grandmother   . Diabetes Maternal Grandmother   . Cancer Father 19       lung cancer and Prostate Cancer  . Hypertension Mother   . Cancer Paternal Aunt        ovarian  . Cancer Cousin        breast, paternal cousin  . Cancer Paternal Uncle        stomach  . Cancer Paternal Grandfather        Esophagus  . Colon cancer Neg Hx     Social History   Socioeconomic History  . Marital status: Married    Spouse name: Not on file  . Number of children: 3  . Years of education: Not on file  . Highest education level: High school graduate  Occupational History  . Occupation: Works at [employer]  Tobacco Use  . Smoking status: Never Smoker  . Smokeless tobacco: Never Used  Vaping Use  . Vaping Use: Never used  Substance and Sexual Activity  . Alcohol use:  No  . Drug use: No  . Sexual activity: Yes    Birth control/protection: Surgical    Comment: menarche 48, Parity age 89, G61, P3, 1 miscarriage,  HRT x 5-10 yrs, Mild Hot Flashes  Other Topics Concern  . Not on file  Social History Narrative   Lives at home.   Social Determinants of Health   Financial Resource Strain: Not on file  Food Insecurity: Not on file  Transportation Needs: Not on file  Physical Activity: Not on file  Stress: Not on file  Social Connections: Not on file  Intimate Partner Violence: Not on file    Past Medical History, Surgical history, Social history, and Family history were reviewed and updated as appropriate.   Please see review of systems for further details on the patient's review from today.   Review of Systems:  Review of Systems  Constitutional: Positive for fatigue. Negative for chills, diaphoresis and fever.  HENT: Negative for trouble swallowing and voice change.   Eyes: Positive for pain.  Respiratory: Negative for cough, chest tightness, shortness of breath and wheezing.   Cardiovascular: Positive for leg swelling. Negative for chest pain  and palpitations.  Gastrointestinal: Positive for abdominal distention. Negative for abdominal pain, constipation, diarrhea, nausea and vomiting.  Musculoskeletal: Negative for back pain and myalgias.  Neurological: Positive for headaches. Negative for dizziness and light-headedness.    Objective:   Physical Exam:  BP (!) 144/82 (BP Location: Right Arm, Patient Position: Sitting) Comment: Leah aware  Pulse 77   Temp 97.8 F (36.6 C) (Tympanic)   Resp 20   Ht 5' 8"  (1.727 m)   Wt 166 lb 1.6 oz (75.3 kg)   SpO2 98%   BMI 25.26 kg/m  ECOG: 0  Physical Exam Constitutional:      General: She is not in acute distress.    Appearance: She is not diaphoretic.  HENT:     Head: Normocephalic and atraumatic.  Eyes:     General: No scleral icterus.       Right eye: No discharge.        Left eye: No discharge.     Conjunctiva/sclera: Conjunctivae normal.  Cardiovascular:     Rate and Rhythm: Normal rate and regular rhythm.     Heart sounds: Normal heart sounds. No murmur heard. No friction rub. No gallop.   Pulmonary:     Effort: Pulmonary effort is normal. No respiratory distress.     Breath sounds: Normal breath sounds. No wheezing or rales.  Abdominal:     General: Bowel sounds are normal. There is no distension.     Tenderness: There is no abdominal tenderness. There is no guarding.  Skin:    General: Skin is warm and dry.     Findings: Lesion (There is a healing wound on the right anterior lower extremity with a scab noted.) present. No erythema or rash.  Neurological:     Mental Status: She is alert.     Coordination: Coordination normal.     Gait: Gait normal.  Psychiatric:        Mood and Affect: Mood normal.        Behavior: Behavior normal.        Thought Content: Thought content normal.        Judgment: Judgment normal.     Lab Review:     Component Value Date/Time   NA 142 01/15/2021 0840   NA 141 09/02/2020 0806   NA 140 09/22/2017 0958  K 3.2 (L)  01/15/2021 0840   K 3.7 09/22/2017 0958   CL 103 01/15/2021 0840   CL 105 08/10/2012 0908   CO2 28 01/15/2021 0840   CO2 26 09/22/2017 0958   GLUCOSE 85 01/15/2021 0840   GLUCOSE 80 09/22/2017 0958   GLUCOSE 81 08/10/2012 0908   BUN 21 01/15/2021 0840   BUN 16 09/02/2020 0806   BUN 12.8 09/22/2017 0958   CREATININE 0.87 01/15/2021 0840   CREATININE 0.6 09/22/2017 0958   CALCIUM 8.5 (L) 01/15/2021 0840   CALCIUM 9.1 09/22/2017 0958   PROT 6.0 (L) 01/15/2021 0840   PROT 6.3 09/02/2020 0806   PROT 7.0 09/22/2017 0958   ALBUMIN 2.9 (L) 01/15/2021 0840   ALBUMIN 4.1 09/02/2020 0806   ALBUMIN 4.0 09/22/2017 0958   AST 59 (H) 01/15/2021 0840   AST 37 (H) 09/22/2017 0958   ALT 50 (H) 01/15/2021 0840   ALT 31 09/22/2017 0958   ALKPHOS 177 (H) 01/15/2021 0840   ALKPHOS 140 09/22/2017 0958   BILITOT 0.5 01/15/2021 0840   BILITOT 0.41 09/22/2017 0958   GFRNONAA >60 01/15/2021 0840   GFRAA 76 09/02/2020 0806   GFRAA >60 07/10/2020 0820       Component Value Date/Time   WBC 4.2 01/15/2021 0840   WBC 5.2 12/11/2020 1140   RBC 2.79 (L) 01/15/2021 0840   HGB 10.1 (L) 01/15/2021 0840   HGB 10.1 (L) 09/02/2020 0806   HGB 11.5 (L) 09/22/2017 0958   HCT 30.0 (L) 01/15/2021 0840   HCT 29.2 (L) 09/02/2020 0806   HCT 34.9 09/22/2017 0958   PLT 117 (L) 01/15/2021 0840   PLT 129 (L) 09/02/2020 0806   MCV 107.5 (H) 01/15/2021 0840   MCV 104 (H) 09/02/2020 0806   MCV 94.3 09/22/2017 0958   MCH 36.2 (H) 01/15/2021 0840   MCHC 33.7 01/15/2021 0840   RDW 13.4 01/15/2021 0840   RDW 13.1 09/02/2020 0806   RDW 15.3 (H) 09/22/2017 0958   LYMPHSABS 2.1 01/15/2021 0840   LYMPHSABS 1.4 09/02/2020 0806   LYMPHSABS 2.2 09/22/2017 0958   MONOABS 0.5 01/15/2021 0840   MONOABS 0.8 09/22/2017 0958   EOSABS 0.1 01/15/2021 0840   EOSABS 0.0 09/02/2020 0806   BASOSABS 0.0 01/15/2021 0840   BASOSABS 0.1 09/02/2020 0806   BASOSABS 0.0 09/22/2017 0958   -------------------------------  Imaging  from last 24 hours (if applicable):  Radiology interpretation: No results found.

## 2021-01-15 ENCOUNTER — Inpatient Hospital Stay: Payer: Medicare Other

## 2021-01-15 ENCOUNTER — Inpatient Hospital Stay (HOSPITAL_BASED_OUTPATIENT_CLINIC_OR_DEPARTMENT_OTHER): Payer: Medicare Other | Admitting: Medical

## 2021-01-15 ENCOUNTER — Other Ambulatory Visit: Payer: Self-pay

## 2021-01-15 ENCOUNTER — Other Ambulatory Visit: Payer: Self-pay | Admitting: Hematology and Oncology

## 2021-01-15 ENCOUNTER — Inpatient Hospital Stay: Payer: Medicare Other | Attending: Hematology and Oncology

## 2021-01-15 VITALS — BP 144/82 | HR 77 | Temp 97.8°F | Resp 20 | Ht 68.0 in | Wt 166.1 lb

## 2021-01-15 VITALS — BP 139/66 | HR 72 | Temp 98.3°F | Resp 18

## 2021-01-15 DIAGNOSIS — I10 Essential (primary) hypertension: Secondary | ICD-10-CM | POA: Insufficient documentation

## 2021-01-15 DIAGNOSIS — C50412 Malignant neoplasm of upper-outer quadrant of left female breast: Secondary | ICD-10-CM

## 2021-01-15 DIAGNOSIS — I6789 Other cerebrovascular disease: Secondary | ICD-10-CM

## 2021-01-15 DIAGNOSIS — E039 Hypothyroidism, unspecified: Secondary | ICD-10-CM | POA: Insufficient documentation

## 2021-01-15 DIAGNOSIS — E785 Hyperlipidemia, unspecified: Secondary | ICD-10-CM | POA: Insufficient documentation

## 2021-01-15 DIAGNOSIS — Z923 Personal history of irradiation: Secondary | ICD-10-CM | POA: Diagnosis not present

## 2021-01-15 DIAGNOSIS — Z170 Estrogen receptor positive status [ER+]: Secondary | ICD-10-CM

## 2021-01-15 DIAGNOSIS — C7931 Secondary malignant neoplasm of brain: Secondary | ICD-10-CM

## 2021-01-15 DIAGNOSIS — Z5111 Encounter for antineoplastic chemotherapy: Secondary | ICD-10-CM | POA: Diagnosis present

## 2021-01-15 DIAGNOSIS — Z803 Family history of malignant neoplasm of breast: Secondary | ICD-10-CM | POA: Diagnosis not present

## 2021-01-15 DIAGNOSIS — Z8041 Family history of malignant neoplasm of ovary: Secondary | ICD-10-CM | POA: Insufficient documentation

## 2021-01-15 DIAGNOSIS — C7951 Secondary malignant neoplasm of bone: Secondary | ICD-10-CM

## 2021-01-15 DIAGNOSIS — Y842 Radiological procedure and radiotherapy as the cause of abnormal reaction of the patient, or of later complication, without mention of misadventure at the time of the procedure: Secondary | ICD-10-CM

## 2021-01-15 DIAGNOSIS — E876 Hypokalemia: Secondary | ICD-10-CM

## 2021-01-15 DIAGNOSIS — Z95828 Presence of other vascular implants and grafts: Secondary | ICD-10-CM

## 2021-01-15 DIAGNOSIS — C50919 Malignant neoplasm of unspecified site of unspecified female breast: Secondary | ICD-10-CM

## 2021-01-15 DIAGNOSIS — I2510 Atherosclerotic heart disease of native coronary artery without angina pectoris: Secondary | ICD-10-CM | POA: Diagnosis not present

## 2021-01-15 DIAGNOSIS — Z800 Family history of malignant neoplasm of digestive organs: Secondary | ICD-10-CM | POA: Diagnosis not present

## 2021-01-15 LAB — CBC WITH DIFFERENTIAL (CANCER CENTER ONLY)
Abs Immature Granulocytes: 0.01 10*3/uL (ref 0.00–0.07)
Basophils Absolute: 0 10*3/uL (ref 0.0–0.1)
Basophils Relative: 1 %
Eosinophils Absolute: 0.1 10*3/uL (ref 0.0–0.5)
Eosinophils Relative: 1 %
HCT: 30 % — ABNORMAL LOW (ref 36.0–46.0)
Hemoglobin: 10.1 g/dL — ABNORMAL LOW (ref 12.0–15.0)
Immature Granulocytes: 0 %
Lymphocytes Relative: 50 %
Lymphs Abs: 2.1 10*3/uL (ref 0.7–4.0)
MCH: 36.2 pg — ABNORMAL HIGH (ref 26.0–34.0)
MCHC: 33.7 g/dL (ref 30.0–36.0)
MCV: 107.5 fL — ABNORMAL HIGH (ref 80.0–100.0)
Monocytes Absolute: 0.5 10*3/uL (ref 0.1–1.0)
Monocytes Relative: 11 %
Neutro Abs: 1.5 10*3/uL — ABNORMAL LOW (ref 1.7–7.7)
Neutrophils Relative %: 37 %
Platelet Count: 117 10*3/uL — ABNORMAL LOW (ref 150–400)
RBC: 2.79 MIL/uL — ABNORMAL LOW (ref 3.87–5.11)
RDW: 13.4 % (ref 11.5–15.5)
WBC Count: 4.2 10*3/uL (ref 4.0–10.5)
nRBC: 0 % (ref 0.0–0.2)

## 2021-01-15 LAB — CMP (CANCER CENTER ONLY)
ALT: 50 U/L — ABNORMAL HIGH (ref 0–44)
AST: 59 U/L — ABNORMAL HIGH (ref 15–41)
Albumin: 2.9 g/dL — ABNORMAL LOW (ref 3.5–5.0)
Alkaline Phosphatase: 177 U/L — ABNORMAL HIGH (ref 38–126)
Anion gap: 11 (ref 5–15)
BUN: 21 mg/dL (ref 8–23)
CO2: 28 mmol/L (ref 22–32)
Calcium: 8.5 mg/dL — ABNORMAL LOW (ref 8.9–10.3)
Chloride: 103 mmol/L (ref 98–111)
Creatinine: 0.87 mg/dL (ref 0.44–1.00)
GFR, Estimated: >60 mL/min (ref >60)
Glucose, Bld: 85 mg/dL (ref 70–99)
Potassium: 3.2 mmol/L — ABNORMAL LOW (ref 3.5–5.1)
Sodium: 142 mmol/L (ref 135–145)
Total Bilirubin: 0.5 mg/dL (ref 0.3–1.2)
Total Protein: 6 g/dL — ABNORMAL LOW (ref 6.5–8.1)

## 2021-01-15 LAB — TOTAL PROTEIN, URINE DIPSTICK: Protein, ur: NEGATIVE mg/dL

## 2021-01-15 MED ORDER — SODIUM CHLORIDE 0.9% FLUSH
10.0000 mL | INTRAVENOUS | Status: DC | PRN
  Administered 2021-01-15: 10 mL
  Filled 2021-01-15: qty 10

## 2021-01-15 MED ORDER — POTASSIUM CHLORIDE CRYS ER 20 MEQ PO TBCR: 20.0000 meq | EXTENDED_RELEASE_TABLET | Freq: Once | ORAL | 2 refills | Status: DC

## 2021-01-15 MED ORDER — ACETAMINOPHEN 325 MG PO TABS
ORAL_TABLET | ORAL | Status: AC
  Filled 2021-01-15: qty 2

## 2021-01-15 MED ORDER — LIDOCAINE-PRILOCAINE 2.5-2.5 % EX CREA: TOPICAL_CREAM | CUTANEOUS | 3 refills | Status: DC

## 2021-01-15 MED ORDER — DEXAMETHASONE 2 MG PO TABS: 2.0000 mg | ORAL_TABLET | Freq: Every day | ORAL | 0 refills | Status: DC

## 2021-01-15 MED ORDER — SODIUM CHLORIDE 0.9 % IV SOLN
Freq: Once | INTRAVENOUS | Status: AC
  Filled 2021-01-15: qty 250

## 2021-01-15 MED ORDER — SODIUM CHLORIDE 0.9 % IV SOLN
3.6000 mg/kg | Freq: Once | INTRAVENOUS | Status: AC
  Administered 2021-01-15: 280 mg via INTRAVENOUS
  Filled 2021-01-15: qty 14

## 2021-01-15 MED ORDER — DIPHENHYDRAMINE HCL 25 MG PO CAPS
50.0000 mg | ORAL_CAPSULE | Freq: Once | ORAL | Status: AC
  Administered 2021-01-15: 50 mg via ORAL

## 2021-01-15 MED ORDER — HEPARIN SOD (PORK) LOCK FLUSH 100 UNIT/ML IV SOLN
500.0000 [IU] | Freq: Once | INTRAVENOUS | Status: AC | PRN
  Administered 2021-01-15: 500 [IU]
  Filled 2021-01-15: qty 5

## 2021-01-15 MED ORDER — SODIUM CHLORIDE 0.9% FLUSH
10.0000 mL | INTRAVENOUS | Status: DC | PRN
  Administered 2021-01-15: 10 mL via INTRAVENOUS
  Filled 2021-01-15: qty 10

## 2021-01-15 MED ORDER — DIPHENHYDRAMINE HCL 25 MG PO CAPS
ORAL_CAPSULE | ORAL | Status: AC
  Filled 2021-01-15: qty 2

## 2021-01-15 MED ORDER — SODIUM CHLORIDE 0.9 % IV SOLN
10.0000 mg/kg | Freq: Once | INTRAVENOUS | Status: AC
  Administered 2021-01-15: 700 mg via INTRAVENOUS
  Filled 2021-01-15: qty 16

## 2021-01-15 MED ORDER — ACETAMINOPHEN 325 MG PO TABS
650.0000 mg | ORAL_TABLET | Freq: Once | ORAL | Status: AC
  Administered 2021-01-15: 650 mg via ORAL

## 2021-01-15 MED FILL — VERZENIO 100 MG TAB: 100 | 28 days supply | Qty: 56 | Fill #2

## 2021-01-15 NOTE — Progress Notes (Signed)
OK to treat.  Jovane Foutz, MHS, PA-C Physician Assistant 

## 2021-01-15 NOTE — Patient Instructions (Signed)
Implanted Port Insertion, Care After This sheet gives you information about how to care for yourself after your procedure. Your health care provider may also give you more specific instructions. If you have problems or questions, contact your health care provider. What can I expect after the procedure? After the procedure, it is common to have:  Discomfort at the port insertion site.  Bruising on the skin over the port. This should improve over 3-4 days. Follow these instructions at home: Port care  After your port is placed, you will get a manufacturer's information card. The card has information about your port. Keep this card with you at all times.  Take care of the port as told by your health care provider. Ask your health care provider if you or a family member can get training for taking care of the port at home. A home health care nurse may also take care of the port.  Make sure to remember what type of port you have. Incision care  Follow instructions from your health care provider about how to take care of your port insertion site. Make sure you: ? Wash your hands with soap and water before and after you change your bandage (dressing). If soap and water are not available, use hand sanitizer. ? Change your dressing as told by your health care provider. ? Leave stitches (sutures), skin glue, or adhesive strips in place. These skin closures may need to stay in place for 2 weeks or longer. If adhesive strip edges start to loosen and curl up, you may trim the loose edges. Do not remove adhesive strips completely unless your health care provider tells you to do that.  Check your port insertion site every day for signs of infection. Check for: ? Redness, swelling, or pain. ? Fluid or blood. ? Warmth. ? Pus or a bad smell.      Activity  Return to your normal activities as told by your health care provider. Ask your health care provider what activities are safe for you.  Do not  lift anything that is heavier than 10 lb (4.5 kg), or the limit that you are told, until your health care provider says that it is safe. General instructions  Take over-the-counter and prescription medicines only as told by your health care provider.  Do not take baths, swim, or use a hot tub until your health care provider approves. Ask your health care provider if you may take showers. You may only be allowed to take sponge baths.  Do not drive for 24 hours if you were given a sedative during your procedure.  Wear a medical alert bracelet in case of an emergency. This will tell any health care providers that you have a port.  Keep all follow-up visits as told by your health care provider. This is important. Contact a health care provider if:  You cannot flush your port with saline as directed, or you cannot draw blood from the port.  You have a fever or chills.  You have redness, swelling, or pain around your port insertion site.  You have fluid or blood coming from your port insertion site.  Your port insertion site feels warm to the touch.  You have pus or a bad smell coming from the port insertion site. Get help right away if:  You have chest pain or shortness of breath.  You have bleeding from your port that you cannot control. Summary  Take care of the port as told by your   health care provider. Keep the manufacturer's information card with you at all times.  Change your dressing as told by your health care provider.  Contact a health care provider if you have a fever or chills or if you have redness, swelling, or pain around your port insertion site.  Keep all follow-up visits as told by your health care provider. This information is not intended to replace advice given to you by your health care provider. Make sure you discuss any questions you have with your health care provider. Document Revised: 05/01/2018 Document Reviewed: 05/01/2018 Elsevier Patient Education   2021 Elsevier Inc.  

## 2021-01-26 ENCOUNTER — Other Ambulatory Visit: Payer: Self-pay | Admitting: Internal Medicine

## 2021-01-26 NOTE — Telephone Encounter (Signed)
Rx refill request

## 2021-02-01 ENCOUNTER — Telehealth: Payer: Self-pay

## 2021-02-01 ENCOUNTER — Other Ambulatory Visit: Payer: Self-pay | Admitting: *Deleted

## 2021-02-01 ENCOUNTER — Inpatient Hospital Stay: Payer: Medicare Other

## 2021-02-01 DIAGNOSIS — E039 Hypothyroidism, unspecified: Secondary | ICD-10-CM

## 2021-02-01 DIAGNOSIS — I10 Essential (primary) hypertension: Secondary | ICD-10-CM

## 2021-02-01 DIAGNOSIS — E782 Mixed hyperlipidemia: Secondary | ICD-10-CM

## 2021-02-01 NOTE — Telephone Encounter (Signed)
done

## 2021-02-05 ENCOUNTER — Inpatient Hospital Stay (HOSPITAL_BASED_OUTPATIENT_CLINIC_OR_DEPARTMENT_OTHER): Payer: Medicare Other | Admitting: Medical

## 2021-02-05 ENCOUNTER — Other Ambulatory Visit: Payer: Self-pay

## 2021-02-05 ENCOUNTER — Inpatient Hospital Stay: Payer: Medicare Other

## 2021-02-05 ENCOUNTER — Other Ambulatory Visit: Payer: Self-pay | Admitting: Internal Medicine

## 2021-02-05 ENCOUNTER — Telehealth: Payer: Self-pay | Admitting: Internal Medicine

## 2021-02-05 ENCOUNTER — Other Ambulatory Visit (HOSPITAL_COMMUNITY): Payer: Self-pay

## 2021-02-05 VITALS — BP 153/76 | HR 75 | Temp 97.1°F | Resp 18 | Ht 68.0 in | Wt 168.0 lb

## 2021-02-05 VITALS — BP 123/65 | HR 76 | Temp 98.2°F | Resp 18

## 2021-02-05 DIAGNOSIS — C50919 Malignant neoplasm of unspecified site of unspecified female breast: Secondary | ICD-10-CM

## 2021-02-05 DIAGNOSIS — Z5111 Encounter for antineoplastic chemotherapy: Secondary | ICD-10-CM | POA: Diagnosis not present

## 2021-02-05 DIAGNOSIS — C7951 Secondary malignant neoplasm of bone: Secondary | ICD-10-CM

## 2021-02-05 DIAGNOSIS — I6789 Other cerebrovascular disease: Secondary | ICD-10-CM

## 2021-02-05 DIAGNOSIS — Z170 Estrogen receptor positive status [ER+]: Secondary | ICD-10-CM

## 2021-02-05 DIAGNOSIS — C7931 Secondary malignant neoplasm of brain: Secondary | ICD-10-CM | POA: Diagnosis not present

## 2021-02-05 DIAGNOSIS — E876 Hypokalemia: Secondary | ICD-10-CM

## 2021-02-05 DIAGNOSIS — C50412 Malignant neoplasm of upper-outer quadrant of left female breast: Secondary | ICD-10-CM

## 2021-02-05 LAB — CBC WITH DIFFERENTIAL (CANCER CENTER ONLY)
Abs Immature Granulocytes: 0.03 10*3/uL (ref 0.00–0.07)
Basophils Absolute: 0 10*3/uL (ref 0.0–0.1)
Basophils Relative: 1 %
Eosinophils Absolute: 0.1 10*3/uL (ref 0.0–0.5)
Eosinophils Relative: 1 %
HCT: 31.8 % — ABNORMAL LOW (ref 36.0–46.0)
Hemoglobin: 10.6 g/dL — ABNORMAL LOW (ref 12.0–15.0)
Immature Granulocytes: 1 %
Lymphocytes Relative: 43 %
Lymphs Abs: 1.5 10*3/uL (ref 0.7–4.0)
MCH: 36.6 pg — ABNORMAL HIGH (ref 26.0–34.0)
MCHC: 33.3 g/dL (ref 30.0–36.0)
MCV: 109.7 fL — ABNORMAL HIGH (ref 80.0–100.0)
Monocytes Absolute: 0.3 10*3/uL (ref 0.1–1.0)
Monocytes Relative: 9 %
Neutro Abs: 1.6 10*3/uL — ABNORMAL LOW (ref 1.7–7.7)
Neutrophils Relative %: 45 %
Platelet Count: 90 10*3/uL — ABNORMAL LOW (ref 150–400)
RBC: 2.9 MIL/uL — ABNORMAL LOW (ref 3.87–5.11)
RDW: 14.3 % (ref 11.5–15.5)
WBC Count: 3.6 10*3/uL — ABNORMAL LOW (ref 4.0–10.5)
nRBC: 0 % (ref 0.0–0.2)

## 2021-02-05 LAB — CMP (CANCER CENTER ONLY)
ALT: 59 U/L — ABNORMAL HIGH (ref 0–44)
AST: 68 U/L — ABNORMAL HIGH (ref 15–41)
Albumin: 3 g/dL — ABNORMAL LOW (ref 3.5–5.0)
Alkaline Phosphatase: 196 U/L — ABNORMAL HIGH (ref 38–126)
Anion gap: 10 (ref 5–15)
BUN: 22 mg/dL (ref 8–23)
CO2: 29 mmol/L (ref 22–32)
Calcium: 9.3 mg/dL (ref 8.9–10.3)
Chloride: 101 mmol/L (ref 98–111)
Creatinine: 0.9 mg/dL (ref 0.44–1.00)
GFR, Estimated: >60 mL/min (ref >60)
Glucose, Bld: 126 mg/dL — ABNORMAL HIGH (ref 70–99)
Potassium: 3.3 mmol/L — ABNORMAL LOW (ref 3.5–5.1)
Sodium: 140 mmol/L (ref 135–145)
Total Bilirubin: 0.6 mg/dL (ref 0.3–1.2)
Total Protein: 6.3 g/dL — ABNORMAL LOW (ref 6.5–8.1)

## 2021-02-05 LAB — TOTAL PROTEIN, URINE DIPSTICK: Protein, ur: NEGATIVE mg/dL

## 2021-02-05 MED ORDER — POTASSIUM CHLORIDE CRYS ER 20 MEQ PO TBCR: 20.0000 meq | EXTENDED_RELEASE_TABLET | Freq: Every day | ORAL | 11 refills | Status: DC

## 2021-02-05 MED ORDER — SODIUM CHLORIDE 0.9 % IV SOLN
Freq: Once | INTRAVENOUS | Status: AC
Start: 2021-02-05 — End: 2021-02-05
  Filled 2021-02-05: qty 250

## 2021-02-05 MED ORDER — SODIUM CHLORIDE 0.9 % IV SOLN
10.0000 mg/kg | Freq: Once | INTRAVENOUS | Status: AC
  Administered 2021-02-05: 700 mg via INTRAVENOUS
  Filled 2021-02-05: qty 12

## 2021-02-05 MED ORDER — ACETAMINOPHEN 325 MG PO TABS
650.0000 mg | ORAL_TABLET | Freq: Once | ORAL | Status: AC
  Administered 2021-02-05: 650 mg via ORAL

## 2021-02-05 MED ORDER — HEPARIN SOD (PORK) LOCK FLUSH 100 UNIT/ML IV SOLN
500.0000 [IU] | Freq: Once | INTRAVENOUS | Status: AC | PRN
  Administered 2021-02-05: 500 [IU]
  Filled 2021-02-05: qty 5

## 2021-02-05 MED ORDER — SODIUM CHLORIDE 0.9% FLUSH
10.0000 mL | INTRAVENOUS | Status: DC | PRN
  Administered 2021-02-05: 10 mL
  Filled 2021-02-05: qty 10

## 2021-02-05 MED ORDER — ACETAMINOPHEN 325 MG PO TABS
ORAL_TABLET | ORAL | Status: AC
  Filled 2021-02-05: qty 2

## 2021-02-05 MED ORDER — DIPHENHYDRAMINE HCL 25 MG PO CAPS
50.0000 mg | ORAL_CAPSULE | Freq: Once | ORAL | Status: AC
Start: 2021-02-05 — End: 2021-02-05
  Administered 2021-02-05: 50 mg via ORAL

## 2021-02-05 MED ORDER — SODIUM CHLORIDE 0.9 % IV SOLN
3.6000 mg/kg | Freq: Once | INTRAVENOUS | Status: AC
  Administered 2021-02-05: 280 mg via INTRAVENOUS
  Filled 2021-02-05: qty 14

## 2021-02-05 MED ORDER — DIPHENHYDRAMINE HCL 25 MG PO CAPS
ORAL_CAPSULE | ORAL | Status: AC
  Filled 2021-02-05: qty 2

## 2021-02-05 NOTE — Telephone Encounter (Signed)
Scheduled appt per 4/22 sch msg. Called pt, no answer. Left msg with appt date and time.

## 2021-02-05 NOTE — Patient Instructions (Addendum)
[name] Riley Discharge Instructions for Patients Receiving Chemotherapy  Today you received the following immunotherapy agents: Ado-Trastuzumab (Kadcyla), Bevacizumab (Avastin)  To help prevent nausea and vomiting after your treatment, we encourage you to take your nausea medication as directed by your MD.   If you develop nausea and vomiting that is not controlled by your nausea medication, call the clinic.   BELOW ARE SYMPTOMS THAT SHOULD BE REPORTED IMMEDIATELY:  *FEVER GREATER THAN 100.5 F  *CHILLS WITH OR WITHOUT FEVER  NAUSEA AND VOMITING THAT IS NOT CONTROLLED WITH YOUR NAUSEA MEDICATION  *UNUSUAL SHORTNESS OF BREATH  *UNUSUAL BRUISING OR BLEEDING  TENDERNESS IN MOUTH AND THROAT WITH OR WITHOUT PRESENCE OF ULCERS  *URINARY PROBLEMS  *BOWEL PROBLEMS  UNUSUAL RASH Items with * indicate a potential emergency and should be followed up as soon as possible.  Feel free to call the clinic should you have any questions or concerns. The clinic phone number is [phone].  Please show the [place] at check-in to the Emergency Department and triage nurse.

## 2021-02-05 NOTE — Patient Instructions (Signed)

## 2021-02-05 NOTE — Progress Notes (Signed)
OK to treat w/ Bevacizumab given by Sandi Mealy, PA (Dr. Lindi Adie on PAL) Dose made up for missed dose 12/11/20.  Planned total of 4 doses per Dr. Mickeal Skinner.  Kennith Center, Pharm.D., CPP 02/05/2021@10 :43 AM

## 2021-02-05 NOTE — Progress Notes (Signed)
[facility]pt[facility] Management [facility] Progress Note   [name] Riley [id number] 03-07-1951 70 y.o.  Lacey Wallman Hellstrom is managed by Dr. Nicholas Lose  Actively treated with chemotherapy/immunotherapy/hormonal therapy: yes  Current therapy: Noah Charon and Kadcyla  Last treated: 01/15/2021   Next scheduled appointment with provider: 02/26/2021  Assessment: Plan:    Brain metastasis ([place])  Hypokalemia - Plan: potassium chloride SA (KLOR-CON) 20 MEQ tablet  Metastasis to bone [facility])  Metastatic breast cancer ([place])   Metastatic breast cancer with bone and brain metastasis: [name] Riley presents to the clinic today for consideration of ongoing therapy with Noah Charon and Kadcyla.  We will proceed with her treatment today and have her return to clinic on 02/26/2021 as scheduled.  The patient will be referred for a repeat echocardiogram if she has progression of lower extremity edema or unexplained weight gain.  Hypokalemia: Potassium level returned at 3.3.  The patient was given a prescription for potassium chloride 20 mcg with instructions to use as directed.  Please see After Visit Summary for patient specific instructions.  Future Appointments  Date Time Provider [place]  02/16/2021  5:00 PM [facility] 1 [facility]  02/18/2021 12:00 PM Ventura Sellers, MD [facility] None  02/26/2021  9:45 AM [facility] [place] FLUSH [facility] None  02/26/2021 10:15 AM Nicholas Lose, MD [facility] None  02/26/2021 11:15 AM [facility] INFUSION [facility] None  03/09/2021  2:45 PM [facility]NUAL WELLNESS VISIT [facility] None  08/12/2021  8:25 AM Stacks, Cletus Gash, MD [facility] None    No orders of the defined types were placed in this encounter.      Subjective:   Patient ID:  [name] Riley is a 70 y.o. (DOB Jun 04, 1951) female.  Chief Complaint: No chief complaint on file.   HPI [name] Riley  is a 70 y.o. female with a diagnosis of a metastatic breast cancer with bone and brain metastasis.  She is managed by  Dr. Nicholas Lose and presents to the clinic today for consideration of ongoing therapy with Noah Charon and Kadcyla.  She reports abdominal bloating and gas bilateral lower extremity edema and burning in her hands and arms.  She has had a episode of epistaxis in her right nares recently.  She reports that episode was quite severe.  Her last echocardiogram was completed on 11/17/2020 returned with an LVEF of 55 to 60%.  She denies fevers, chills, sweats, nausea, vomiting, constipation, or diarrhea.  Medications: I have reviewed the patient's current medications.  Allergies:  Allergies  Allergen Reactions  . Cantaloupe Extract Allergy Skin Test Shortness Of Breath  . Contrast Media [Iodinated Diagnostic Agents] Shortness Of Breath and Rash  . Pravastatin Other (See Comments)    Legs hurt  . Zosyn [Piperacillin Sod-Tazobactam So] Rash and Other (See Comments)    Temperature increase, facial flushing  . Latex Rash    Redness, itch     Past Medical History:  Diagnosis Date  . Allergy   . Breast cancer ([place]) 08/25/2011   L , invasive ductal carcinoma, ER/PR +,HER2 -  . Cancer [facility])    left breast cancer  . Coronary artery disease 2001  . Heart attack [facility]) 09/2000   Sep 25, 2000  --no intervention  . History of chemotherapy comp. 08/22/2012   4 cycles of FEC and $ cycles of Taxotere  . Hyperlipidemia   . Hypertension   . Hypothyroidism   . PONV (postoperative nausea and vomiting)    gets sick from anesthesia  . Status post radiation therapy  07/09/12 - 08/22/2012   Left Breast, 60.4 gray    Past Surgical History:  Procedure Laterality Date  . ABDOMINAL HYSTERECTOMY  1998   TAH, oophorectomy  . APPENDECTOMY  1970  . BREAST CYST EXCISION Left 07/29/2020   Procedure: WIDE EXCISION OF LEFT MASTECTOMY INCISION;  Surgeon: Donnie Mesa, MD;  Location: [place];  Service: General;  Laterality: Left;  . BREAST SURGERY  1998   removal of benign lump in rt breast  . BREAST  SURGERY  11/14/11   right simple mastectomy, left mrm  . INCISION AND DRAINAGE OF WOUND Left 07/01/2017   Procedure: IRRIGATION AND DEBRIDEMENT CHEST WALL ABSCESS;  Surgeon: Donnie Mesa, MD;  Location: [facility];  Service: General;  Laterality: Left;  Marland Kitchen MASS EXCISION Left 06/22/2017   Procedure: EXCISION OF CHEST WALL MASSES;  Surgeon: Donnie Mesa, MD;  Location: [place];  Service: General;  Laterality: Left;  Marland Kitchen MASTECTOMY Bilateral    for left breast cancer  . OVARIAN CYST SURGERY Right 1970  . PORT-A-CATH REMOVAL  08/30/2012   Procedure: REMOVAL PORT-A-CATH;  Surgeon: Imogene Burn. [name], MD;  Location: [place];  Service: General;  Laterality: Right;  port removal  . PORTACATH PLACEMENT  11/14/2011   Procedure: INSERTION PORT-A-CATH;  Surgeon: Imogene Burn. [name], MD;  Location: [place];  Service: General;  Laterality: Right;  . PORTACATH PLACEMENT N/A 04/25/2016   Procedure: INSERTION PORT-A-CATH LEFT CHEST;  Surgeon: Donnie Mesa, MD;  Location: [place];  Service: General;  Laterality: N/A;  . skin tags  05/09/1997   left axillary left neck skin tags  . TONSILLECTOMY  1968    Family History  Problem Relation Age of Onset  . Hypertension Maternal Grandmother   . Diabetes Maternal Grandmother   . Cancer Father 39       lung cancer and Prostate Cancer  . Hypertension Mother   . Cancer Paternal Aunt        ovarian  . Cancer Cousin        breast, paternal cousin  . Cancer Paternal Uncle        stomach  . Cancer Paternal Grandfather        Esophagus  . Colon cancer Neg Hx     Social History   Socioeconomic History  . Marital status: Married    Spouse name: Not on file  . Number of children: 3  . Years of education: Not on file  . Highest education level: High school graduate  Occupational History  . Occupation: Works at [employer]  Tobacco Use  . Smoking status: Never Smoker  . Smokeless tobacco: Never Used  Vaping Use  . Vaping Use:  Never used  Substance and Sexual Activity  . Alcohol use: No  . Drug use: No  . Sexual activity: Yes    Birth control/protection: Surgical    Comment: menarche 35, Parity age 42, G27, P3, 1 miscarriage,  HRT x 5-10 yrs, Mild Hot Flashes  Other Topics Concern  . Not on file  Social History Narrative   Lives at home.   Social Determinants of Health   Financial Resource Strain: Not on file  Food Insecurity: Not on file  Transportation Needs: Not on file  Physical Activity: Not on file  Stress: Not on file  Social Connections: Not on file  Intimate Partner Violence: Not on file    Past Medical History, Surgical history, Social history, and Family history were reviewed and updated  as appropriate.   Please see review of systems for further details on the patient's review from today.   Review of Systems:  Review of Systems  Constitutional: Negative for chills, diaphoresis and fever.  HENT: Positive for nosebleeds. Negative for trouble swallowing and voice change.   Respiratory: Negative for cough, chest tightness, shortness of breath and wheezing.   Cardiovascular: Positive for leg swelling. Negative for chest pain and palpitations.  Gastrointestinal: Positive for abdominal distention. Negative for abdominal pain, constipation, diarrhea, nausea and vomiting.  Musculoskeletal: Negative for back pain and myalgias.  Neurological: Negative for dizziness, light-headedness and headaches.       Burning of the hands and arms.    Objective:   Physical Exam:  BP (!) 153/76 (BP Location: Right Arm, Patient Position: Sitting)   Pulse 75   Temp (!) 97.1 F (36.2 C) (Tympanic)   Resp 18   Ht 5' 8"  (1.727 m)   Wt 168 lb (76.2 kg)   SpO2 99%   BMI 25.54 kg/m  ECOG: 0  Physical Exam Constitutional:      General: She is not in acute distress.    Appearance: She is not diaphoretic.  HENT:     Head: Normocephalic and atraumatic.  Eyes:     General: No scleral icterus.       Right  eye: No discharge.        Left eye: No discharge.     Conjunctiva/sclera: Conjunctivae normal.  Cardiovascular:     Rate and Rhythm: Normal rate and regular rhythm.     Heart sounds: Normal heart sounds. No murmur heard. No friction rub. No gallop.   Pulmonary:     Effort: Pulmonary effort is normal. No respiratory distress.     Breath sounds: Normal breath sounds. No wheezing or rales.  Abdominal:     General: Bowel sounds are normal. There is no distension.     Tenderness: There is no abdominal tenderness. There is no guarding.  Skin:    General: Skin is warm and dry.     Findings: No erythema or rash.  Neurological:     Mental Status: She is alert.     Coordination: Coordination normal.     Gait: Gait normal.  Psychiatric:        Mood and Affect: Mood normal.        Behavior: Behavior normal.        Thought Content: Thought content normal.        Judgment: Judgment normal.     Lab Review:     Component Value Date/Time   NA 138 02/09/2021 1551   NA 140 09/22/2017 0958   K 3.5 02/09/2021 1551   K 3.7 09/22/2017 0958   CL 98 02/09/2021 1551   CL 105 08/10/2012 0908   CO2 24 02/09/2021 1551   CO2 26 09/22/2017 0958   GLUCOSE 149 (H) 02/09/2021 1551   GLUCOSE 126 (H) 02/05/2021 0809   GLUCOSE 80 09/22/2017 0958   GLUCOSE 81 08/10/2012 0908   BUN 24 02/09/2021 1551   BUN 12.8 09/22/2017 0958   CREATININE 1.08 (H) 02/09/2021 1551   CREATININE 0.90 02/05/2021 0809   CREATININE 0.6 09/22/2017 0958   CALCIUM 9.4 02/09/2021 1551   CALCIUM 9.1 09/22/2017 0958   PROT 5.8 (L) 02/09/2021 1551   PROT 7.0 09/22/2017 0958   ALBUMIN 3.7 (L) 02/09/2021 1551   ALBUMIN 4.0 09/22/2017 0958   AST 91 (H) 02/09/2021 1551   AST 68 (H) 02/05/2021 0809  AST 37 (H) 09/22/2017 0958   ALT 68 (H) 02/09/2021 1551   ALT 59 (H) 02/05/2021 0809   ALT 31 09/22/2017 0958   ALKPHOS 246 (H) 02/09/2021 1551   ALKPHOS 140 09/22/2017 0958   BILITOT 0.7 02/09/2021 1551   BILITOT 0.6 02/05/2021  0809   BILITOT 0.41 09/22/2017 0958   GFRNONAA >60 02/05/2021 0809   GFRAA 76 09/02/2020 0806   GFRAA >60 07/10/2020 0820       Component Value Date/Time   WBC 4.3 02/09/2021 1551   WBC 3.6 (L) 02/05/2021 0809   WBC 5.2 12/11/2020 1140   RBC 2.96 (L) 02/09/2021 1551   RBC 2.90 (L) 02/05/2021 0809   HGB 10.7 (L) 02/09/2021 1551   HGB 11.5 (L) 09/22/2017 0958   HCT 31.2 (L) 02/09/2021 1551   HCT 34.9 09/22/2017 0958   PLT 101 (L) 02/09/2021 1551   MCV 105 (H) 02/09/2021 1551   MCV 94.3 09/22/2017 0958   MCH 36.1 (H) 02/09/2021 1551   MCH 36.6 (H) 02/05/2021 0809   MCHC 34.3 02/09/2021 1551   MCHC 33.3 02/05/2021 0809   RDW 13.8 02/09/2021 1551   RDW 15.3 (H) 09/22/2017 0958   LYMPHSABS 1.5 02/09/2021 1551   LYMPHSABS 2.2 09/22/2017 0958   MONOABS 0.3 02/05/2021 0809   MONOABS 0.8 09/22/2017 0958   EOSABS 0.0 02/09/2021 1551   BASOSABS 0.0 02/09/2021 1551   BASOSABS 0.0 09/22/2017 0958   -------------------------------  Imaging from last 24 hours (if applicable):  Radiology interpretation: No results found.    OK to treat with plt count at 90.  Sandi Mealy, MHS, PA-C Physician Assistant

## 2021-02-05 NOTE — Progress Notes (Signed)
Per Sandi Mealy PA, ok for treatment today with platelets 90.

## 2021-02-06 ENCOUNTER — Other Ambulatory Visit: Payer: Self-pay | Admitting: Hematology and Oncology

## 2021-02-08 ENCOUNTER — Other Ambulatory Visit (HOSPITAL_COMMUNITY): Payer: Self-pay

## 2021-02-09 ENCOUNTER — Other Ambulatory Visit: Payer: Self-pay

## 2021-02-09 ENCOUNTER — Encounter: Payer: Self-pay | Admitting: Family Medicine

## 2021-02-09 ENCOUNTER — Ambulatory Visit (INDEPENDENT_AMBULATORY_CARE_PROVIDER_SITE_OTHER): Payer: Medicare Other | Admitting: Family Medicine

## 2021-02-09 DIAGNOSIS — C50919 Malignant neoplasm of unspecified site of unspecified female breast: Secondary | ICD-10-CM

## 2021-02-09 DIAGNOSIS — E039 Hypothyroidism, unspecified: Secondary | ICD-10-CM

## 2021-02-09 DIAGNOSIS — E782 Mixed hyperlipidemia: Secondary | ICD-10-CM

## 2021-02-09 DIAGNOSIS — I10 Essential (primary) hypertension: Secondary | ICD-10-CM

## 2021-02-09 MED ORDER — METOPROLOL SUCCINATE ER 100 MG PO TB24: ORAL_TABLET | ORAL | 1 refills | Status: DC

## 2021-02-09 MED ORDER — ROSUVASTATIN CALCIUM 5 MG PO TABS: 5.0000 mg | ORAL_TABLET | ORAL | 1 refills | Status: DC

## 2021-02-09 MED ORDER — VALSARTAN 80 MG PO TABS: ORAL_TABLET | ORAL | 1 refills | Status: DC

## 2021-02-09 NOTE — Progress Notes (Signed)
Subjective:  Patient ID: [name] Riley, female    DOB: October 30, 1950  Age: 70 y.o. MRN: [id number]  CC: Medical Management of Chronic Issues   HPI [name] Riley presents for  follow-up of hypertension. Patient has no history of headache chest pain or shortness of breath or recent cough. Patient also denies symptoms of TIA such as focal numbness or weakness. Patient denies side effects from medication. States taking it regularly.  ON multiple chemo drugs. Recent liver bx. Path confirmed metastatic carcinoma. Feels well.   follow-up on  thyroid. The patient has a history of hypothyroidism for many years. It has been stable recently. Pt. denies any change in  voice, loss of hair, heat or cold intolerance. Energy level has been adequate to good. Patient denies constipation and diarrhea. No myxedema. Medication is as noted below. Verified that pt is taking it daily on an empty stomach. Well tolerated.    History [name] Riley has a past medical history of Allergy, Breast cancer ([place]) (08/25/2011), Cancer [facility]), Coronary artery disease (2001), Heart attack ([place]) (09/2000), History of chemotherapy (comp. 08/22/2012), Hyperlipidemia, Hypertension, Hypothyroidism, PONV (postoperative nausea and vomiting), and Status post radiation therapy (07/09/12 - 08/22/2012).   She has a past surgical history that includes Tonsillectomy (1968); Ovarian cyst surgery (Right, 1970); skin tags (05/09/1997); Portacath placement (11/14/2011); Breast surgery (1998); Breast surgery (11/14/11); Abdominal hysterectomy (1998); Appendectomy (1970); Port-a-cath removal (08/30/2012); Mastectomy (Bilateral); Portacath placement (N/A, 04/25/2016); Mass excision (Left, 06/22/2017); Incision and drainage of wound (Left, 07/01/2017); and Breast cyst excision (Left, 07/29/2020).   Her family history includes Cancer in her cousin, paternal aunt, paternal grandfather, and paternal uncle; Cancer (age of onset: 31) in her father; Diabetes in her maternal  grandmother; Hypertension in her maternal grandmother and mother.She reports that she has never smoked. She has never used smokeless tobacco. She reports that she does not drink alcohol and does not use drugs.  Current Outpatient Medications on File Prior to Visit  Medication Sig Dispense Refill  . abemaciclib (VERZENIO) 100 MG tablet TAKE 1 TABLET BY MOUTH 2 TIMES DAILY. SWALLOW TABLETS WHOLE. DO NOT CHEW, CRUSH, OR SPLIT TABLETS BEFORE SWALLOWING. 56 tablet 3  . acetaminophen (TYLENOL) 500 MG tablet Take 1,000 mg by mouth every 6 (six) hours as needed for moderate pain.    . Ascorbic Acid (VITAMIN C PO) Take by mouth.    . cholecalciferol (VITAMIN D) 1000 units tablet Take 1 tablet (1,000 Units total) by mouth daily.    Marland Kitchen dexamethasone (DECADRON) 2 MG tablet TAKE TWO TABLETS BY MOUTH DAILY 60 tablet 0  . Docusate Sodium (DSS) 100 MG CAPS Take by mouth.    . furosemide (LASIX) 20 MG tablet TAKE ONE (1) TABLET BY MOUTH EVERY DAY 30 tablet 1  . letrozole (FEMARA) 2.5 MG tablet TAKE ONE (1) TABLET EACH DAY 90 tablet 3  . levETIRAcetam (KEPPRA) 500 MG tablet Take 1 tablet (500 mg total) by mouth 2 (two) times daily. 60 tablet 3  . levothyroxine (SYNTHROID) 100 MCG tablet TAKE ONE TABLET EACH MORNING BEFORE BREAKFAST 90 tablet 2  . lidocaine-prilocaine (EMLA) cream APPLY TOPICALLY AS NEEDED FOR PORT ACCESS 30 g 3  . Multiple Vitamins-Minerals (MULTIVITAMIN WITH MINERALS) tablet Take 1 tablet by mouth daily.    . ondansetron (ZOFRAN-ODT) 4 MG disintegrating tablet TAKE 1 TABLET EVERY 6 HOURS AS NEEDED FOR NAUSEA AND VOMITING 30 tablet 3  . potassium chloride SA (KLOR-CON) 20 MEQ tablet Take 1 tablet (20 mEq total) by mouth daily for  360 doses. 30 tablet 11  . predniSONE (DELTASONE) 50 MG tablet Prednisone 46m PO 13 hours, 7 hours, and 1 hour before CT scan. 3 tablet 0  . triamcinolone (KENALOG) 0.025 % ointment Apply 1 application topically 2 (two) times daily. 80 g 1   No current  facility-administered medications on file prior to visit.    ROS Review of Systems  Constitutional: Negative.   HENT: Negative.   Eyes: Negative for visual disturbance.  Respiratory: Negative for shortness of breath.   Cardiovascular: Negative for chest pain.  Gastrointestinal: Negative for abdominal pain.  Musculoskeletal: Negative for arthralgias.    Objective:  BP 127/69   Pulse 81   Temp (!) 97.4 F (36.3 C)   Ht 5' 8"  (1.727 m)   Wt 166 lb 3.2 oz (75.4 kg)   SpO2 97%   BMI 25.27 kg/m   BP Readings from Last 3 Encounters:  02/09/21 127/69  02/05/21 123/65  02/05/21 (!) 153/76    Wt Readings from Last 3 Encounters:  02/09/21 166 lb 3.2 oz (75.4 kg)  02/05/21 168 lb (76.2 kg)  01/15/21 166 lb 1.6 oz (75.3 kg)     Physical Exam Constitutional:      General: She is not in acute distress.    Appearance: She is well-developed.  HENT:     Head: Normocephalic and atraumatic.  Eyes:     Conjunctiva/sclera: Conjunctivae normal.     Pupils: Pupils are equal, round, and reactive to light.  Neck:     Thyroid: No thyromegaly.  Cardiovascular:     Rate and Rhythm: Normal rate and regular rhythm.     Heart sounds: Normal heart sounds. No murmur heard.   Pulmonary:     Effort: Pulmonary effort is normal. No respiratory distress.     Breath sounds: Normal breath sounds. No wheezing or rales.  Abdominal:     General: Bowel sounds are normal. There is no distension.     Palpations: Abdomen is soft.     Tenderness: There is no abdominal tenderness.  Musculoskeletal:        General: Normal range of motion.     Cervical back: Normal range of motion and neck supple.  Lymphadenopathy:     Cervical: No cervical adenopathy.  Skin:    General: Skin is warm and dry.  Neurological:     Mental Status: She is alert and oriented to person, place, and time.  Psychiatric:        Behavior: Behavior normal.        Thought Content: Thought content normal.        Judgment:  Judgment normal.       Assessment & Plan:   CNaviawas seen today for medical management of chronic issues.  Diagnoses and all orders for this visit:  Metastatic breast cancer ([place] -     metoprolol succinate (TOPROL-XL) 100 MG 24 hr tablet; TAKE ONE (1) TABLET EACH DAY  Essential hypertension -     metoprolol succinate (TOPROL-XL) 100 MG 24 hr tablet; TAKE ONE (1) TABLET EACH DAY -     valsartan (DIOVAN) 80 MG tablet; TAKE ONE (1) TABLET EACH DAY -     CBC with Differential/Platelet -     CMP14+EGFR  Mixed hyperlipidemia -     Lipid panel  Hypothyroidism, unspecified type -     Thyroid Panel With TSH  Other orders -     Discontinue: rosuvastatin (CRESTOR) 5 MG tablet; Take 1 tablet (5 mg total)  by mouth every other day.   Allergies as of 02/09/2021      Reactions   Cantaloupe Extract Allergy Skin Test Shortness Of Breath   Contrast Media [iodinated Diagnostic Agents] Shortness Of Breath, Rash   Pravastatin Other (See Comments)   Legs hurt   Zosyn [piperacillin Sod-tazobactam So] Rash, Other (See Comments)   Temperature increase, facial flushing   Latex Rash   Redness, itch      Medication List       Accurate as of February 09, 2021 11:59 PM. If you have any questions, ask your nurse or doctor.        acetaminophen 500 MG tablet Commonly known as: TYLENOL Take 1,000 mg by mouth every 6 (six) hours as needed for moderate pain.   cholecalciferol 1000 units tablet Commonly known as: VITAMIN D Take 1 tablet (1,000 Units total) by mouth daily.   dexamethasone 2 MG tablet Commonly known as: DECADRON TAKE TWO TABLETS BY MOUTH DAILY   DSS 100 MG Caps Take by mouth.   furosemide 20 MG tablet Commonly known as: LASIX TAKE ONE (1) TABLET BY MOUTH EVERY DAY   letrozole 2.5 MG tablet Commonly known as: FEMARA TAKE ONE (1) TABLET EACH DAY   levETIRAcetam 500 MG tablet Commonly known as: KEPPRA Take 1 tablet (500 mg total) by mouth 2 (two) times daily.    levothyroxine 100 MCG tablet Commonly known as: SYNTHROID TAKE ONE TABLET EACH MORNING BEFORE BREAKFAST   lidocaine-prilocaine cream Commonly known as: EMLA APPLY TOPICALLY AS NEEDED FOR PORT ACCESS   metoprolol succinate 100 MG 24 hr tablet Commonly known as: TOPROL-XL TAKE ONE (1) TABLET EACH DAY   multivitamin with minerals tablet Take 1 tablet by mouth daily.   ondansetron 4 MG disintegrating tablet Commonly known as: ZOFRAN-ODT TAKE 1 TABLET EVERY 6 HOURS AS NEEDED FOR NAUSEA AND VOMITING   potassium chloride SA 20 MEQ tablet Commonly known as: KLOR-CON Take 1 tablet (20 mEq total) by mouth daily for 360 doses.   predniSONE 50 MG tablet Commonly known as: DELTASONE Prednisone 82m PO 13 hours, 7 hours, and 1 hour before CT scan.   rosuvastatin 5 MG tablet Commonly known as: CRESTOR Take 1 tablet (5 mg total) by mouth every other day. What changed: when to take this Changed by: WClaretta Fraise MD   triamcinolone 0.025 % ointment Commonly known as: KENALOG Apply 1 application topically 2 (two) times daily.   valsartan 80 MG tablet Commonly known as: DIOVAN TAKE ONE (1) TABLET EACH DAY   Verzenio 100 MG tablet Generic drug: abemaciclib TAKE 1 TABLET BY MOUTH 2 TIMES DAILY. SWALLOW TABLETS WHOLE. DO NOT CHEW, CRUSH, OR SPLIT TABLETS BEFORE SWALLOWING.   VITAMIN C PO Take by mouth.       Meds ordered this encounter  Medications  . metoprolol succinate (TOPROL-XL) 100 MG 24 hr tablet    Sig: TAKE ONE (1) TABLET EACH DAY    Dispense:  90 tablet    Refill:  1  . valsartan (DIOVAN) 80 MG tablet    Sig: TAKE ONE (1) TABLET EACH DAY    Dispense:  90 tablet    Refill:  1  . DISCONTD: rosuvastatin (CRESTOR) 5 MG tablet    Sig: Take 1 tablet (5 mg total) by mouth every other day.    Dispense:  45 tablet    Refill:  1      Follow-up: Return in about 6 months (around 08/11/2021).  WClaretta Fraise M.D.

## 2021-02-10 ENCOUNTER — Other Ambulatory Visit: Payer: Self-pay | Admitting: *Deleted

## 2021-02-10 ENCOUNTER — Other Ambulatory Visit (HOSPITAL_COMMUNITY): Payer: Self-pay

## 2021-02-10 LAB — LIPID PANEL
Chol/HDL Ratio: 2.4 ratio (ref 0.0–4.4)
Cholesterol, Total: 297 mg/dL — ABNORMAL HIGH (ref 100–199)
HDL: 126 mg/dL (ref >39)
LDL Chol Calc (NIH): 155 mg/dL — ABNORMAL HIGH (ref 0–99)
Triglycerides: 99 mg/dL (ref 0–149)
VLDL Cholesterol Cal: 16 mg/dL (ref 5–40)

## 2021-02-10 LAB — CBC WITH DIFFERENTIAL/PLATELET
Basophils Absolute: 0 10*3/uL (ref 0.0–0.2)
Basos: 1 %
EOS (ABSOLUTE): 0 10*3/uL (ref 0.0–0.4)
Eos: 0 %
Hematocrit: 31.2 % — ABNORMAL LOW (ref 34.0–46.6)
Hemoglobin: 10.7 g/dL — ABNORMAL LOW (ref 11.1–15.9)
Immature Grans (Abs): 0 10*3/uL (ref 0.0–0.1)
Immature Granulocytes: 1 %
Lymphocytes Absolute: 1.5 10*3/uL (ref 0.7–3.1)
Lymphs: 36 %
MCH: 36.1 pg — ABNORMAL HIGH (ref 26.6–33.0)
MCHC: 34.3 g/dL (ref 31.5–35.7)
MCV: 105 fL — ABNORMAL HIGH (ref 79–97)
Monocytes Absolute: 0.3 10*3/uL (ref 0.1–0.9)
Monocytes: 7 %
NRBC: 1 % — ABNORMAL HIGH (ref 0–0)
Neutrophils Absolute: 2.4 10*3/uL (ref 1.4–7.0)
Neutrophils: 55 %
Platelets: 101 10*3/uL — ABNORMAL LOW (ref 150–450)
RBC: 2.96 x10E6/uL — ABNORMAL LOW (ref 3.77–5.28)
RDW: 13.8 % (ref 11.7–15.4)
WBC: 4.3 10*3/uL (ref 3.4–10.8)

## 2021-02-10 LAB — CMP14+EGFR
ALT: 68 IU/L — ABNORMAL HIGH (ref 0–32)
AST: 91 IU/L — ABNORMAL HIGH (ref 0–40)
Albumin/Globulin Ratio: 1.8 (ref 1.2–2.2)
Albumin: 3.7 g/dL — ABNORMAL LOW (ref 3.8–4.8)
Alkaline Phosphatase: 246 IU/L — ABNORMAL HIGH (ref 44–121)
BUN/Creatinine Ratio: 22 (ref 12–28)
BUN: 24 mg/dL (ref 8–27)
Bilirubin Total: 0.7 mg/dL (ref 0.0–1.2)
CO2: 24 mmol/L (ref 20–29)
Calcium: 9.4 mg/dL (ref 8.7–10.3)
Chloride: 98 mmol/L (ref 96–106)
Creatinine, Ser: 1.08 mg/dL — ABNORMAL HIGH (ref 0.57–1.00)
Globulin, Total: 2.1 g/dL (ref 1.5–4.5)
Glucose: 149 mg/dL — ABNORMAL HIGH (ref 65–99)
Potassium: 3.5 mmol/L (ref 3.5–5.2)
Sodium: 138 mmol/L (ref 134–144)
Total Protein: 5.8 g/dL — ABNORMAL LOW (ref 6.0–8.5)
eGFR: 56 mL/min/{1.73_m2} — ABNORMAL LOW (ref >59)

## 2021-02-10 LAB — THYROID PANEL WITH TSH
Free Thyroxine Index: 2.4 (ref 1.2–4.9)
T3 Uptake Ratio: 23 % — ABNORMAL LOW (ref 24–39)
T4, Total: 10.4 ug/dL (ref 4.5–12.0)
TSH: 0.457 u[IU]/mL (ref 0.450–4.500)

## 2021-02-10 MED ORDER — ROSUVASTATIN CALCIUM 5 MG PO TABS: 5.0000 mg | ORAL_TABLET | Freq: Every day | ORAL | 1 refills | Status: DC

## 2021-02-10 MED FILL — Abemaciclib Tab 100 MG: ORAL | 28 days supply | Qty: 56 | Fill #0 | Status: AC

## 2021-02-11 ENCOUNTER — Encounter: Payer: Self-pay | Admitting: Family Medicine

## 2021-02-15 ENCOUNTER — Other Ambulatory Visit: Payer: Self-pay | Admitting: Internal Medicine

## 2021-02-16 ENCOUNTER — Ambulatory Visit (HOSPITAL_COMMUNITY)
Admission: RE | Admit: 2021-02-16 | Discharge: 2021-02-16 | Disposition: A | Discharge status: Home / Self Care | Payer: Medicare Other | Source: Ambulatory Visit | Attending: Internal Medicine | Admitting: Internal Medicine

## 2021-02-16 ENCOUNTER — Other Ambulatory Visit: Payer: Self-pay

## 2021-02-16 ENCOUNTER — Other Ambulatory Visit: Payer: Self-pay | Admitting: Radiation Therapy

## 2021-02-16 DIAGNOSIS — C7931 Secondary malignant neoplasm of brain: Secondary | ICD-10-CM

## 2021-02-16 MED ORDER — HEPARIN SOD (PORK) LOCK FLUSH 100 UNIT/ML IV SOLN
500.0000 [IU] | INTRAVENOUS | Status: AC | PRN
  Administered 2021-02-16: 500 [IU]
  Filled 2021-02-16: qty 5

## 2021-02-16 MED ORDER — GADOBUTROL 1 MMOL/ML IV SOLN
7.5000 mL | Freq: Once | INTRAVENOUS | Status: AC | PRN
  Administered 2021-02-16: 7.5 mL via INTRAVENOUS

## 2021-02-18 ENCOUNTER — Inpatient Hospital Stay: Payer: Medicare Other | Attending: Hematology and Oncology | Admitting: Internal Medicine

## 2021-02-18 ENCOUNTER — Other Ambulatory Visit (HOSPITAL_COMMUNITY): Payer: Self-pay

## 2021-02-18 ENCOUNTER — Other Ambulatory Visit: Payer: Self-pay

## 2021-02-18 VITALS — BP 137/90 | HR 98 | Temp 97.5°F | Resp 18 | Ht 68.0 in | Wt 166.7 lb

## 2021-02-18 DIAGNOSIS — Z803 Family history of malignant neoplasm of breast: Secondary | ICD-10-CM | POA: Insufficient documentation

## 2021-02-18 DIAGNOSIS — E039 Hypothyroidism, unspecified: Secondary | ICD-10-CM | POA: Insufficient documentation

## 2021-02-18 DIAGNOSIS — Z9221 Personal history of antineoplastic chemotherapy: Secondary | ICD-10-CM | POA: Diagnosis not present

## 2021-02-18 DIAGNOSIS — Z79811 Long term (current) use of aromatase inhibitors: Secondary | ICD-10-CM | POA: Insufficient documentation

## 2021-02-18 DIAGNOSIS — C50412 Malignant neoplasm of upper-outer quadrant of left female breast: Secondary | ICD-10-CM | POA: Insufficient documentation

## 2021-02-18 DIAGNOSIS — E785 Hyperlipidemia, unspecified: Secondary | ICD-10-CM | POA: Diagnosis not present

## 2021-02-18 DIAGNOSIS — Z8042 Family history of malignant neoplasm of prostate: Secondary | ICD-10-CM | POA: Insufficient documentation

## 2021-02-18 DIAGNOSIS — Z800 Family history of malignant neoplasm of digestive organs: Secondary | ICD-10-CM | POA: Insufficient documentation

## 2021-02-18 DIAGNOSIS — Z5112 Encounter for antineoplastic immunotherapy: Secondary | ICD-10-CM | POA: Insufficient documentation

## 2021-02-18 DIAGNOSIS — Z170 Estrogen receptor positive status [ER+]: Secondary | ICD-10-CM | POA: Insufficient documentation

## 2021-02-18 DIAGNOSIS — Z801 Family history of malignant neoplasm of trachea, bronchus and lung: Secondary | ICD-10-CM | POA: Diagnosis not present

## 2021-02-18 DIAGNOSIS — I2510 Atherosclerotic heart disease of native coronary artery without angina pectoris: Secondary | ICD-10-CM | POA: Diagnosis not present

## 2021-02-18 DIAGNOSIS — C7931 Secondary malignant neoplasm of brain: Secondary | ICD-10-CM | POA: Insufficient documentation

## 2021-02-18 DIAGNOSIS — Z923 Personal history of irradiation: Secondary | ICD-10-CM | POA: Insufficient documentation

## 2021-02-18 DIAGNOSIS — I10 Essential (primary) hypertension: Secondary | ICD-10-CM | POA: Insufficient documentation

## 2021-02-18 NOTE — Progress Notes (Signed)
[facility] [place], [place] [address] [phone]   Interval Evaluation  Date of Service: 02/18/21 Patient Name: [name] Riley Patient MRN: [id number] Patient DOB: [birth year] Provider: Ventura Sellers, MD  Identifying Statement:  [name] Riley is a 70 y.o. female with seizures, brain metastases  Primary Cancer:  Oncologic History: Oncology History  Breast cancer of upper-outer quadrant of left female breast ([place])  11/14/2011 Surgery   Bilateral mastectomy, prophylactic on the right, left breast IDC 3/18 lymph nodes positive with extracapsular extension ER 89%, PR 81%, HER-2 negative, Ki-67 79% T2 N1 A. stage IIB   12/13/2011 - 06/28/2012 Chemotherapy   4 cycles of FEC followed by 4 cycles of Taxotere   07/17/2012 - 08/22/2012 Radiation Therapy   Adjuvant radiation therapy   08/22/2012 - 03/16/2016 Anti-estrogen oral therapy   Arimidex 1 mg daily   03/16/2016 Relapse/Recurrence   Subcutaneous nodule excision left chest: Infiltrating carcinoma breast primary, ER positive, PR negative   03/29/2016 Imaging   CT CAP and bone scan: Lytic lesions T8 vertebral, T1 posterior element, subcutaneous nodule left lateral chest wall, nonspecific lung nodules; Bone scan: Mets to kull, left humerus, left eighth rib, T7/T8, sternum, left acetabulum   04/28/2016 - 06/17/2017 Chemotherapy   Herceptin, lapatinib, Faslodex, Zometa every 4 weeks, lapatinib discontinued in September 2018 due to elevation of LFTs   06/22/2017 Relapse/Recurrence   Surgical excision:Soft tissue mass left lateral chest wall primary breast cancer, soft tissue mass left medial chest wall breast cancer, tumor is within the dermis extending to the subcutaneous adipose tissue and involves portions of skeletal muscle   06/22/2017 Cancer Staging   Staging form: Breast, AJCC 7th Edition - Pathologic stage from 06/22/2017: Stage IV (TX, NX, M1) - Signed by Nicholas Lose, MD on  12/27/2019   08/2017 - 02/16/2018 Chemotherapy   Faslodex with Herceptin and Perjeta along with Zometa every 4 weeks   03/02/2018 - 05/25/2018 Chemotherapy   Kadcyla   05/11/2018 Imaging   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 Associated vasogenic edema within the underlying right cerebral hemisphere without significant midline shift. Signal abnormality throughout the visualized bone marrow, compatible with osseous metastatic disease.    06/04/2018 Imaging   CT CAP: Right lower lobe lung nodule 7 mm (was 5 mm); multiple bone metastases throughout the spine and ribs sternum scapula and humerus, slightly increased lower thoracic mets, right renal lesion 2.5 cm (was 1.2 cm) right femur met increased from 2.1 cm to 2.9 cm   06/15/2018 -  Anti-estrogen oral therapy   Abemaciclib, Herceptin, letrozole, Xgeva   06/07/2019 Imaging   Progression of brain metastases,2 discrete dural-based lesions involving the posterior right frontal lobe. The more anterior lesion now measures 16.5 x 17.5 x 14 mm. The posterior lesion now measures 9.5 x 13 x 20 mm.  Vasogenic edema of the frontal and parietal lobes   06/21/2019 - 06/27/2019 Radiation Therapy   SRS to the brain   07/19/2020 Relapse/Recurrence   Left mastectomy scar nodule: Biopsy invasive carcinoma ER 60%, PR 0%, HER-2 negative by [facility]   07/29/2020 Surgery   Soft tissue mass left chest wall excision: No evidence of malignancy.   11/27/2020 -  Chemotherapy    Patient is on Treatment Plan: BRAIN GBM BEVACIZUMAB 14D X 6 CYCLES   Patient is on Antibody Plan: BREAST ADO-TRASTUZUMAB EMTANSINE [facility]) Q21D    01/15/2021 -  Chemotherapy    Patient is on Treatment Plan:  BRAIN GBM BEVACIZUMAB 14D X 6 CYCLES   Patient is on Antibody Plan: BREAST ADO-TRASTUZUMAB EMTANSINE (KADCYLA) Q21D    Metastatic breast cancer ([place])  06/29/2017 Initial Diagnosis   Metastatic breast cancer ([place])   06/15/2018 - 12/25/2020 Chemotherapy    Patient  is on Treatment Plan: BRAIN GBM BEVACIZUMAB 14D X 6 CYCLES      01/15/2021 -  Chemotherapy    Patient is on Treatment Plan: BRAIN GBM BEVACIZUMAB 14D X 6 CYCLES   Patient is on Antibody Plan: BREAST ADO-TRASTUZUMAB EMTANSINE (KADCYLA) Q21D    Brain metastasis ([place])  07/13/2018 Initial Diagnosis   Brain metastasis ([place])   11/27/2020 -  Chemotherapy    Patient is on Treatment Plan: BRAIN GBM BEVACIZUMAB 14D X 6 CYCLES   Patient is on Antibody Plan: BREAST ADO-TRASTUZUMAB EMTANSINE (KADCYLA) Q21D      Interval History:  PHENIX GREIN presents today after recent MRI brain.  She tolerated avastin thearpy (4th infusion completed 02/05/21) very well.  She does complain of some insomnia, swelling in face and abdomen. Otherwise no additional headaches or seizures, now on Kadcyla and Abemaciclib with Dr. Lindi Adie.  Dexamethasone  09/03/20: 61m 09/15/20: 156m01/12/22: 1852m1/14/22: 4mg87medications: Current Outpatient Medications on File Prior to Visit  Medication Sig Dispense Refill  . abemaciclib (VERZENIO) 100 MG tablet TAKE 1 TABLET BY MOUTH 2 TIMES DAILY. SWALLOW TABLETS WHOLE. DO NOT CHEW, CRUSH, OR SPLIT TABLETS BEFORE SWALLOWING. 56 tablet 3  . acetaminophen (TYLENOL) 500 MG tablet Take 1,000 mg by mouth every 6 (six) hours as needed for moderate pain.    . Ascorbic Acid (VITAMIN C PO) Take by mouth.    . cholecalciferol (VITAMIN D) 1000 units tablet Take 1 tablet (1,000 Units total) by mouth daily.    . deMarland Kitchenamethasone (DECADRON) 2 MG tablet TAKE TWO TABLETS BY MOUTH DAILY 60 tablet 0  . Docusate Sodium (DSS) 100 MG CAPS Take by mouth.    . furosemide (LASIX) 20 MG tablet TAKE ONE (1) TABLET BY MOUTH EVERY DAY 30 tablet 1  . letrozole (FEMARA) 2.5 MG tablet TAKE ONE (1) TABLET EACH DAY 90 tablet 3  . levETIRAcetam (KEPPRA) 500 MG tablet Take 1 tablet (500 mg total) by mouth 2 (two) times daily. 60 tablet 3  . levothyroxine (SYNTHROID) 100 MCG tablet TAKE ONE TABLET EACH MORNING BEFORE  BREAKFAST 90 tablet 2  . lidocaine-prilocaine (EMLA) cream APPLY TOPICALLY AS NEEDED FOR PORT ACCESS 30 g 3  . metoprolol succinate (TOPROL-XL) 100 MG 24 hr tablet TAKE ONE (1) TABLET EACH DAY 90 tablet 1  . Multiple Vitamins-Minerals (MULTIVITAMIN WITH MINERALS) tablet Take 1 tablet by mouth daily.    . ondansetron (ZOFRAN-ODT) 4 MG disintegrating tablet TAKE 1 TABLET EVERY 6 HOURS AS NEEDED FOR NAUSEA AND VOMITING 30 tablet 3  . potassium chloride SA (KLOR-CON) 20 MEQ tablet Take 1 tablet (20 mEq total) by mouth daily for 360 doses. 30 tablet 11  . rosuvastatin (CRESTOR) 5 MG tablet Take 1 tablet (5 mg total) by mouth daily. (new directions) 90 tablet 1  . triamcinolone (KENALOG) 0.025 % ointment Apply 1 application topically 2 (two) times daily. 80 g 1  . valsartan (DIOVAN) 80 MG tablet TAKE ONE (1) TABLET EACH DAY 90 tablet 1  . predniSONE (DELTASONE) 50 MG tablet Prednisone 50mg67m13 hours, 7 hours, and 1 hour before CT scan. (Patient not taking: Reported on 02/18/2021) 3 tablet 0   No current facility-administered medications on file prior to  visit.    Allergies:  Allergies  Allergen Reactions  . Cantaloupe Extract Allergy Skin Test Shortness Of Breath  . Contrast Media [Iodinated Diagnostic Agents] Shortness Of Breath and Rash  . Pravastatin Other (See Comments)    Legs hurt  . Zosyn [Piperacillin Sod-Tazobactam So] Rash and Other (See Comments)    Temperature increase, facial flushing  . Latex Rash    Redness, itch    Past Medical History:  Past Medical History:  Diagnosis Date  . Allergy   . Breast cancer ([place]) 08/25/2011   L , invasive ductal carcinoma, ER/PR +,HER2 -  . Cancer [facility])    left breast cancer  . Coronary artery disease 2001  . Heart attack [facility]) 09/2000   Sep 25, 2000  --no intervention  . History of chemotherapy comp. 08/22/2012   4 cycles of FEC and $ cycles of Taxotere  . Hyperlipidemia   . Hypertension   . Hypothyroidism   . PONV (postoperative  nausea and vomiting)    gets sick from anesthesia  . Status post radiation therapy 07/09/12 - 08/22/2012   Left Breast, 60.4 gray   Past Surgical History:  Past Surgical History:  Procedure Laterality Date  . ABDOMINAL HYSTERECTOMY  1998   TAH, oophorectomy  . APPENDECTOMY  1970  . BREAST CYST EXCISION Left 07/29/2020   Procedure: WIDE EXCISION OF LEFT MASTECTOMY INCISION;  Surgeon: Donnie Mesa, MD;  Location: [place];  Service: General;  Laterality: Left;  . BREAST SURGERY  1998   removal of benign lump in rt breast  . BREAST SURGERY  11/14/11   right simple mastectomy, left mrm  . INCISION AND DRAINAGE OF WOUND Left 07/01/2017   Procedure: IRRIGATION AND DEBRIDEMENT CHEST WALL ABSCESS;  Surgeon: Donnie Mesa, MD;  Location: [facility];  Service: General;  Laterality: Left;  Marland Kitchen MASS EXCISION Left 06/22/2017   Procedure: EXCISION OF CHEST WALL MASSES;  Surgeon: Donnie Mesa, MD;  Location: [place];  Service: General;  Laterality: Left;  Marland Kitchen MASTECTOMY Bilateral    for left breast cancer  . OVARIAN CYST SURGERY Right 1970  . PORT-A-CATH REMOVAL  08/30/2012   Procedure: REMOVAL PORT-A-CATH;  Surgeon: Imogene Burn. [name], MD;  Location: [place];  Service: General;  Laterality: Right;  port removal  . PORTACATH PLACEMENT  11/14/2011   Procedure: INSERTION PORT-A-CATH;  Surgeon: Imogene Burn. [name], MD;  Location: [place];  Service: General;  Laterality: Right;  . PORTACATH PLACEMENT N/A 04/25/2016   Procedure: INSERTION PORT-A-CATH LEFT CHEST;  Surgeon: Donnie Mesa, MD;  Location: [place];  Service: General;  Laterality: N/A;  . skin tags  05/09/1997   left axillary left neck skin tags  . TONSILLECTOMY  1968   Social History:  Social History   Socioeconomic History  . Marital status: Married    Spouse name: Not on file  . Number of children: 3  . Years of education: Not on file  . Highest education level: High school graduate  Occupational  History  . Occupation: Works at [employer]  Tobacco Use  . Smoking status: Never Smoker  . Smokeless tobacco: Never Used  Vaping Use  . Vaping Use: Never used  Substance and Sexual Activity  . Alcohol use: No  . Drug use: No  . Sexual activity: Yes    Birth control/protection: Surgical    Comment: menarche 21, Parity age 89, G66, P3, 1 miscarriage,  HRT x 5-10 yrs, Mild Hot Flashes  Other Topics Concern  . Not on file  Social History Narrative   Lives at home.   Social Determinants of Health   Financial Resource Strain: Not on file  Food Insecurity: Not on file  Transportation Needs: Not on file  Physical Activity: Not on file  Stress: Not on file  Social Connections: Not on file  Intimate Partner Violence: Not on file   Family History:  Family History  Problem Relation Age of Onset  . Hypertension Maternal Grandmother   . Diabetes Maternal Grandmother   . Cancer Father 71       lung cancer and Prostate Cancer  . Hypertension Mother   . Cancer Paternal Aunt        ovarian  . Cancer Cousin        breast, paternal cousin  . Cancer Paternal Uncle        stomach  . Cancer Paternal Grandfather        Esophagus  . Colon cancer Neg Hx     Review of Systems: Constitutional: Denies fevers, chills or abnormal weight loss Eyes: Denies blurriness of vision Ears, nose, mouth, throat, and face: Denies mucositis or sore throat Respiratory: Denies cough, dyspnea or wheezes Cardiovascular: Denies palpitation, chest discomfort or lower extremity swelling Gastrointestinal:  Denies nausea, constipation, diarrhea GU: Denies dysuria or incontinence Skin: Denies abnormal skin rashes Neurological: Per HPI Musculoskeletal: Denies joint pain, back or neck discomfort. No decrease in ROM Behavioral/Psych: Denies anxiety, disturbance in thought content, and mood instability   Physical Exam: Vitals:   02/18/21 1148  BP: 137/90  Pulse: 98  Resp: 18  Temp: (!) 97.5 F (36.4  C)  SpO2: 98%   KPS: 80. General: Alert, cooperative, pleasant, in no acute distress Head: Normal EENT: No conjunctival injection or scleral icterus. Oral mucosa moist Lungs: Resp effort normal Cardiac: Regular rate and rhythm Abdomen: Soft, non-distended abdomen Skin: No rashes cyanosis or petechiae. Extremities: No clubbing or edema  Neurologic Exam: Mental Status: Awake, alert, attentive to examiner. Oriented to self and environment. Language is fluent with intact comprehension.  Cranial Nerves: Visual acuity is grossly normal. Visual fields are full. Extra-ocular movements intact. No ptosis. Face is symmetric, tongue midline. Motor: Tone and bulk are normal. Arms and legs 5/5. Reflexes are symmetric, no pathologic reflexes present. Intact finger to nose bilaterally Sensory: Normal Gait: Normal and tandem gait is deferred   Labs: I have reviewed the data as listed    Component Value Date/Time   NA 138 02/09/2021 1551   NA 140 09/22/2017 0958   K 3.5 02/09/2021 1551   K 3.7 09/22/2017 0958   CL 98 02/09/2021 1551   CL 105 08/10/2012 0908   CO2 24 02/09/2021 1551   CO2 26 09/22/2017 0958   GLUCOSE 149 (H) 02/09/2021 1551   GLUCOSE 126 (H) 02/05/2021 0809   GLUCOSE 80 09/22/2017 0958   GLUCOSE 81 08/10/2012 0908   BUN 24 02/09/2021 1551   BUN 12.8 09/22/2017 0958   CREATININE 1.08 (H) 02/09/2021 1551   CREATININE 0.90 02/05/2021 0809   CREATININE 0.6 09/22/2017 0958   CALCIUM 9.4 02/09/2021 1551   CALCIUM 9.1 09/22/2017 0958   PROT 5.8 (L) 02/09/2021 1551   PROT 7.0 09/22/2017 0958   ALBUMIN 3.7 (L) 02/09/2021 1551   ALBUMIN 4.0 09/22/2017 0958   AST 91 (H) 02/09/2021 1551   AST 68 (H) 02/05/2021 0809   AST 37 (H) 09/22/2017 0958   ALT 68 (H) 02/09/2021 1551   ALT  59 (H) 02/05/2021 0809   ALT 31 09/22/2017 0958   ALKPHOS 246 (H) 02/09/2021 1551   ALKPHOS 140 09/22/2017 0958   BILITOT 0.7 02/09/2021 1551   BILITOT 0.6 02/05/2021 0809   BILITOT 0.41 09/22/2017  0958   GFRNONAA >60 02/05/2021 0809   GFRAA 76 09/02/2020 0806   GFRAA >60 07/10/2020 0820   Lab Results  Component Value Date   WBC 4.3 02/09/2021   NEUTROABS 2.4 02/09/2021   HGB 10.7 (L) 02/09/2021   HCT 31.2 (L) 02/09/2021   MCV 105 (H) 02/09/2021   PLT 101 (L) 02/09/2021     Imaging:  [place] Clinician Interpretation: I have personally reviewed the CNS images as listed.  [name] interpretation, in the context of the patient's clinical presentation, is stable disease  MR BRAIN W WO CONTRAST  Result Date: 02/18/2021 CLINICAL DATA:  Assess treatment response. EXAM: MRI HEAD WITHOUT AND WITH CONTRAST TECHNIQUE: Multiplanar, multiecho pulse sequences of the brain and surrounding structures were obtained without and with intravenous contrast. CONTRAST:  7.99m GADAVIST GADOBUTROL 1 MMOL/ML IV SOLN COMPARISON:  11/07/2019 FINDINGS: Brain: 3 areas of masslike brain enhancement on the right involving the frontal parietal parenchyma. These are adjacent to an extensive bony metastasis with dural thickening and are favored to reflect direct invasion of the brain from long-standing dural disease. These areas have regressed in enhancement avidity and size. The most anterior, which is at the superior right frontal lobe, measures 15 mm on 1100:250-previously 21 mm. Marked reduction in vasogenic edema. Separate 5 mm dural based nodule in the right middle cranial fossa is non progressed. Vascular: Normal flow voids and preserved vascular enhancements Skull and upper cervical spine: Multifocal sclerotic metastatic disease without detected progression or notable enhancement. The most confluent metastases are seen in the right and vertex cranium extending into the right sphenoid wing and lateral orbit. There is also notable involvement of the upper cervical spine, skull base, and right mandible. Sinuses/Orbits: Negative for orbital mass. IMPRESSION: Regression of enhancing metastatic disease and especially of cerebral  edema. No new or progressive finding. Electronically Signed   By: JMonte FantasiaM.D.   On: 02/18/2021 06:05      Assessment/Plan 1. Brain metastasis ([facility]  Ms. PMcmahenis clinically stable today.  MRI demonstrates considerable improvement in T2/FLAIR signal burden, and also decrease in volume of contrast enhancement.  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    Decadron should be decerased to 265mdaily x5 days, then 23m69maily x5 days, then STOP if tolerated.  Will recommend continuing Keppra 500m26mD.  We appreciate the opportunity to participate in the care of CaroSeleen Walterddy.    We ask that CaroELLE VEZINAurn to clinic in 3 months following next brain MRI, or sooner as needed.    All questions were answered. The patient knows to call the clinic with any problems, questions or concerns. No barriers to learning were detected.  I have spent a total of 30 minutes of face-to-face and non-face-to-face time, excluding clinical staff time, preparing to see patient, ordering tests and/or medications, counseling the patient, and independently interpreting results and communicating results to the patient/family/caregiver    ZachVentura Sellers Medical Director of Neuro-Oncology [facility] LLCWeslCarsonville05/22 4:08 PM

## 2021-02-25 NOTE — Assessment & Plan Note (Signed)
Current treatment: Abemaciclibwith letrozole along with Herceptin(to be given every 4 weeks)and Xgeva started 06/15/2018  CT CAP 05/25/2020: No significant interval change in the bone metastases. 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 Mickeal Skinner is adding Avastin to the treatment.Today is cycle 1  CTCAP2/07/2021: New hepatic lesions worrisome for hepatic mets (1.6 cm, 1.2 cm, 2.3 cm)  Recommendation: 1. Biopsy of the liver lesion.  12/11/2020: Metastatic carcinoma, ER 75% week to moderate, PR 0%, Ki-67 25%, HER-2 equivocal by IHC, FISH positive ratio 2.97, copy #5.2 2. Change treatment plan: Kadcyla along with abemaciclib and letrozole --------------------------------------------------------------------------------------------------------------------- Current Treatment: Kadcyla q 3 weeks. Today is cycle 1  Return to clinic in 3 weeks to start [place].

## 2021-02-25 NOTE — Progress Notes (Signed)
Patient Care Team: [name], MD as PCP - General (Family Medicine) Minus Breeding, MD as PCP - Cardiology (Cardiology) Nicholas Lose, MD as Consulting Physician (Hematology and Oncology)  DIAGNOSIS:    ICD-10-CM   1. Malignant neoplasm of upper-outer quadrant of left female breast, unspecified estrogen receptor status ([place])  C50.412     SUMMARY OF ONCOLOGIC HISTORY: Oncology History  Breast cancer of upper-outer quadrant of left female breast ([place])  11/14/2011 Surgery   Bilateral mastectomy, prophylactic on the right, left breast IDC 3/18 lymph nodes positive with extracapsular extension ER 89%, PR 81%, HER-2 negative, Ki-67 79% T2 N1 A. stage IIB   12/13/2011 - 06/28/2012 Chemotherapy   4 cycles of FEC followed by 4 cycles of Taxotere   07/17/2012 - 08/22/2012 Radiation Therapy   Adjuvant radiation therapy   08/22/2012 - 03/16/2016 Anti-estrogen oral therapy   Arimidex 1 mg daily   03/16/2016 Relapse/Recurrence   Subcutaneous nodule excision left chest: Infiltrating carcinoma breast primary, ER positive, PR negative   03/29/2016 Imaging   CT CAP and bone scan: Lytic lesions T8 vertebral, T1 posterior element, subcutaneous nodule left lateral chest wall, nonspecific lung nodules; Bone scan: Mets to kull, left humerus, left eighth rib, T7/T8, sternum, left acetabulum   04/28/2016 - 06/17/2017 Chemotherapy   Herceptin, lapatinib, Faslodex, Zometa every 4 weeks, lapatinib discontinued in September 2018 due to elevation of LFTs   06/22/2017 Relapse/Recurrence   Surgical excision:Soft tissue mass left lateral chest wall primary breast cancer, soft tissue mass left medial chest wall breast cancer, tumor is within the dermis extending to the subcutaneous adipose tissue and involves portions of skeletal muscle   06/22/2017 Cancer Staging   Staging form: Breast, AJCC 7th Edition - Pathologic stage from 06/22/2017: Stage IV (TX, NX, M1) - Signed by Nicholas Lose, MD on 12/27/2019   08/2017 -  02/16/2018 Chemotherapy   Faslodex with Herceptin and Perjeta along with Zometa every 4 weeks   03/02/2018 - 05/25/2018 Chemotherapy   Kadcyla   05/11/2018 Imaging   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 Associated vasogenic edema within the underlying right cerebral hemisphere without significant midline shift. Signal abnormality throughout the visualized bone marrow, compatible with osseous metastatic disease.    06/04/2018 Imaging   CT CAP: Right lower lobe lung nodule 7 mm (was 5 mm); multiple bone metastases throughout the spine and ribs sternum scapula and humerus, slightly increased lower thoracic mets, right renal lesion 2.5 cm (was 1.2 cm) right femur met increased from 2.1 cm to 2.9 cm   06/15/2018 -  Anti-estrogen oral therapy   Abemaciclib, Herceptin, letrozole, Xgeva   06/07/2019 Imaging   Progression of brain metastases,2 discrete dural-based lesions involving the posterior right frontal lobe. The more anterior lesion now measures 16.5 x 17.5 x 14 mm. The posterior lesion now measures 9.5 x 13 x 20 mm.  Vasogenic edema of the frontal and parietal lobes   06/21/2019 - 06/27/2019 Radiation Therapy   SRS to the brain   07/19/2020 Relapse/Recurrence   Left mastectomy scar nodule: Biopsy invasive carcinoma ER 60%, PR 0%, HER-2 negative by [facility]   07/29/2020 Surgery   Soft tissue mass left chest wall excision: No evidence of malignancy.   11/27/2020 - 02/05/2021 Chemotherapy      Patient is on Antibody Plan: BREAST ADO-TRASTUZUMAB EMTANSINE ([place]) Q21D    01/15/2021 -  Chemotherapy    Patient is on Treatment Plan: BRAIN GBM BEVACIZUMAB 14D X 6 CYCLES   Patient is on Antibody Plan:  BREAST ADO-TRASTUZUMAB EMTANSINE [facility]) Q21D    Metastatic breast cancer ([place])  06/29/2017 Initial Diagnosis   Metastatic breast cancer ([place])   06/15/2018 - 12/25/2020 Chemotherapy    Patient is on Treatment Plan: BRAIN GBM BEVACIZUMAB 14D X 6 CYCLES      01/15/2021 -   Chemotherapy    Patient is on Treatment Plan: BRAIN GBM BEVACIZUMAB 14D X 6 CYCLES   Patient is on Antibody Plan: BREAST ADO-TRASTUZUMAB EMTANSINE (KADCYLA) Q21D    Brain metastasis ([place])  07/13/2018 Initial Diagnosis   Brain metastasis ([place])   11/27/2020 - 02/05/2021 Chemotherapy      Patient is on Antibody Plan: BREAST ADO-TRASTUZUMAB EMTANSINE (KADCYLA) Q21D      CHIEF COMPLIANT: Follow-up of metastatic breast cancer  INTERVAL HISTORY: [name] Riley is a 70 y.o. with above-mentioned history of metastatic breast cancer currently on abemaciclib with letrozole along with Herceptin, Avastin,andXgeva. Brain MRI on 02/16/21 showed regression of enhancing metastatic disease and cerebral edema, and no new or progressive finding.She presents to the clinic todayto review the pathology report and discuss further treatment.  Her major complaint is her fatigue as well as difficulty sleeping as result of Decadron.  They are tapering the Decadron dosage down.  ALLERGIES:  is allergic to cantaloupe extract allergy skin test, contrast media [iodinated diagnostic agents], pravastatin, zosyn [piperacillin sod-tazobactam so], and latex.  MEDICATIONS:  Current Outpatient Medications  Medication Sig Dispense Refill  . abemaciclib (VERZENIO) 100 MG tablet TAKE 1 TABLET BY MOUTH 2 TIMES DAILY. SWALLOW TABLETS WHOLE. DO NOT CHEW, CRUSH, OR SPLIT TABLETS BEFORE SWALLOWING. 56 tablet 3  . acetaminophen (TYLENOL) 500 MG tablet Take 1,000 mg by mouth every 6 (six) hours as needed for moderate pain.    . Ascorbic Acid (VITAMIN C PO) Take by mouth.    . cholecalciferol (VITAMIN D) 1000 units tablet Take 1 tablet (1,000 Units total) by mouth daily.    Marland Kitchen dexamethasone (DECADRON) 2 MG tablet TAKE TWO TABLETS BY MOUTH DAILY 60 tablet 0  . Docusate Sodium (DSS) 100 MG CAPS Take by mouth.    . furosemide (LASIX) 20 MG tablet TAKE ONE (1) TABLET BY MOUTH EVERY DAY 30 tablet 1  . letrozole (FEMARA) 2.5 MG tablet TAKE  ONE (1) TABLET EACH DAY 90 tablet 3  . levETIRAcetam (KEPPRA) 500 MG tablet Take 1 tablet (500 mg total) by mouth 2 (two) times daily. 60 tablet 3  . levothyroxine (SYNTHROID) 100 MCG tablet TAKE ONE TABLET EACH MORNING BEFORE BREAKFAST 90 tablet 2  . lidocaine-prilocaine (EMLA) cream APPLY TOPICALLY AS NEEDED FOR PORT ACCESS 30 g 3  . metoprolol succinate (TOPROL-XL) 100 MG 24 hr tablet TAKE ONE (1) TABLET EACH DAY 90 tablet 1  . Multiple Vitamins-Minerals (MULTIVITAMIN WITH MINERALS) tablet Take 1 tablet by mouth daily.    . ondansetron (ZOFRAN-ODT) 4 MG disintegrating tablet TAKE 1 TABLET EVERY 6 HOURS AS NEEDED FOR NAUSEA AND VOMITING 30 tablet 3  . potassium chloride SA (KLOR-CON) 20 MEQ tablet Take 1 tablet (20 mEq total) by mouth daily for 360 doses. 30 tablet 11  . rosuvastatin (CRESTOR) 5 MG tablet Take 1 tablet (5 mg total) by mouth daily. (new directions) 90 tablet 1  . triamcinolone (KENALOG) 0.025 % ointment Apply 1 application topically 2 (two) times daily. 80 g 1  . valsartan (DIOVAN) 80 MG tablet TAKE ONE (1) TABLET EACH DAY 90 tablet 1   No current facility-administered medications for this visit.    PHYSICAL EXAMINATION: ECOG PERFORMANCE STATUS:  1 - Symptomatic but completely ambulatory  Vitals:   02/26/21 0948  BP: (!) 163/84  Pulse: 97  Resp: 18  Temp: 97.7 F (36.5 C)  SpO2: 98%   Filed Weights   02/26/21 0948  Weight: 167 lb 8 oz (76 kg)     LABORATORY DATA:  I have reviewed the data as listed CMP Latest Ref Rng & Units 02/09/2021 02/05/2021 01/15/2021  Glucose 65 - 99 mg/dL 149(H) 126(H) 85  BUN 8 - 27 mg/dL 24 22 21   Creatinine 0.57 - 1.00 mg/dL 1.08(H) 0.90 0.87  Sodium 134 - 144 mmol/L 138 140 142  Potassium 3.5 - 5.2 mmol/L 3.5 3.3(L) 3.2(L)  Chloride 96 - 106 mmol/L 98 101 103  CO2 20 - 29 mmol/L 24 29 28   Calcium 8.7 - 10.3 mg/dL 9.4 9.3 8.5(L)  Total Protein 6.0 - 8.5 g/dL 5.8(L) 6.3(L) 6.0(L)  Total Bilirubin 0.0 - 1.2 mg/dL 0.7 0.6 0.5   Alkaline Phos 44 - 121 IU/L 246(H) 196(H) 177(H)  AST 0 - 40 IU/L 91(H) 68(H) 59(H)  ALT 0 - 32 IU/L 68(H) 59(H) 50(H)    Lab Results  Component Value Date   WBC 3.9 (L) 02/26/2021   HGB 10.8 (L) 02/26/2021   HCT 32.6 (L) 02/26/2021   MCV 110.1 (H) 02/26/2021   PLT 105 (L) 02/26/2021   NEUTROABS PENDING 02/26/2021    ASSESSMENT & PLAN:  Breast cancer of upper-outer quadrant of left female breast ([place]) Current treatment: Abemaciclibwith letrozole along with Herceptin(to be given every 4 weeks)and Xgeva started 06/15/2018  CT CAP 05/25/2020: No significant interval change in the bone metastases. 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 Mickeal Skinner is adding Avastin to the treatment.Today is cycle 1  CTCAP2/07/2021: New hepatic lesions worrisome for hepatic mets (1.6 cm, 1.2 cm, 2.3 cm)  Recommendation: 1. Biopsy of the liver lesion.  12/11/2020: Metastatic carcinoma, ER 75% week to moderate, PR 0%, Ki-67 25%, HER-2 equivocal by IHC, FISH positive ratio 2.97, copy #5.2 2. Change treatment plan: Kadcyla along with abemaciclib and letrozole --------------------------------------------------------------------------------------------------------------------- Current Treatment: Kadcyla q 3 weeks. Today is cycle 3 (she completed bevacizumab x4) Kadcyla toxicities:  1.  Fatigue as a result of chemotherapy: Stable 2. chemo induced anemia: Monitoring closely Denies peripheral neuropathy  Abemaciclib toxicities: Denies any diarrhea  Scans will be done in 3 months     No orders of the defined types were placed in this encounter.  The patient has a good understanding of the overall plan. she agrees with it. she will call with any problems that may develop before the next visit here.  Total time spent: 30  mins including face to face time and time spent for planning, charting and coordination of care  Rulon Eisenmenger, MD, MPH 02/26/2021  I, Molly Dorshimer, am acting as scribe for Dr. Nicholas Lose.  I have reviewed the above documentation for accuracy and completeness, and I agree with the above.

## 2021-02-26 ENCOUNTER — Other Ambulatory Visit: Payer: Medicare Other

## 2021-02-26 ENCOUNTER — Inpatient Hospital Stay (HOSPITAL_BASED_OUTPATIENT_CLINIC_OR_DEPARTMENT_OTHER): Payer: Medicare Other | Admitting: Hematology and Oncology

## 2021-02-26 ENCOUNTER — Inpatient Hospital Stay: Payer: Medicare Other

## 2021-02-26 ENCOUNTER — Other Ambulatory Visit: Payer: Self-pay

## 2021-02-26 VITALS — BP 111/59 | HR 86 | Temp 98.3°F | Resp 17

## 2021-02-26 DIAGNOSIS — C50412 Malignant neoplasm of upper-outer quadrant of left female breast: Secondary | ICD-10-CM

## 2021-02-26 DIAGNOSIS — Z95828 Presence of other vascular implants and grafts: Secondary | ICD-10-CM

## 2021-02-26 DIAGNOSIS — C50919 Malignant neoplasm of unspecified site of unspecified female breast: Secondary | ICD-10-CM

## 2021-02-26 DIAGNOSIS — Z5112 Encounter for antineoplastic immunotherapy: Secondary | ICD-10-CM | POA: Diagnosis not present

## 2021-02-26 LAB — CBC WITH DIFFERENTIAL (CANCER CENTER ONLY)
Abs Immature Granulocytes: 0.04 10*3/uL (ref 0.00–0.07)
Basophils Absolute: 0 10*3/uL (ref 0.0–0.1)
Basophils Relative: 1 %
Eosinophils Absolute: 0.1 10*3/uL (ref 0.0–0.5)
Eosinophils Relative: 2 %
HCT: 32.6 % — ABNORMAL LOW (ref 36.0–46.0)
Hemoglobin: 10.8 g/dL — ABNORMAL LOW (ref 12.0–15.0)
Immature Granulocytes: 1 %
Lymphocytes Relative: 26 %
Lymphs Abs: 1 10*3/uL (ref 0.7–4.0)
MCH: 36.5 pg — ABNORMAL HIGH (ref 26.0–34.0)
MCHC: 33.1 g/dL (ref 30.0–36.0)
MCV: 110.1 fL — ABNORMAL HIGH (ref 80.0–100.0)
Monocytes Absolute: 0.3 10*3/uL (ref 0.1–1.0)
Monocytes Relative: 8 %
Neutro Abs: 2.4 10*3/uL (ref 1.7–7.7)
Neutrophils Relative %: 62 %
Platelet Count: 105 10*3/uL — ABNORMAL LOW (ref 150–400)
RBC: 2.96 MIL/uL — ABNORMAL LOW (ref 3.87–5.11)
RDW: 14.5 % (ref 11.5–15.5)
WBC Count: 3.9 10*3/uL — ABNORMAL LOW (ref 4.0–10.5)
nRBC: 0.5 % — ABNORMAL HIGH (ref 0.0–0.2)

## 2021-02-26 LAB — CMP (CANCER CENTER ONLY)
ALT: 59 U/L — ABNORMAL HIGH (ref 0–44)
AST: 67 U/L — ABNORMAL HIGH (ref 15–41)
Albumin: 2.9 g/dL — ABNORMAL LOW (ref 3.5–5.0)
Alkaline Phosphatase: 220 U/L — ABNORMAL HIGH (ref 38–126)
Anion gap: 7 (ref 5–15)
BUN: 22 mg/dL (ref 8–23)
CO2: 29 mmol/L (ref 22–32)
Calcium: 9.3 mg/dL (ref 8.9–10.3)
Chloride: 101 mmol/L (ref 98–111)
Creatinine: 0.8 mg/dL (ref 0.44–1.00)
GFR, Estimated: >60 mL/min (ref >60)
Glucose, Bld: 89 mg/dL (ref 70–99)
Potassium: 3.6 mmol/L (ref 3.5–5.1)
Sodium: 137 mmol/L (ref 135–145)
Total Bilirubin: 0.9 mg/dL (ref 0.3–1.2)
Total Protein: 6.3 g/dL — ABNORMAL LOW (ref 6.5–8.1)

## 2021-02-26 MED ORDER — DIPHENHYDRAMINE HCL 25 MG PO CAPS
50.0000 mg | ORAL_CAPSULE | Freq: Once | ORAL | Status: AC
  Administered 2021-02-26: 50 mg via ORAL

## 2021-02-26 MED ORDER — SODIUM CHLORIDE 0.9% FLUSH
10.0000 mL | INTRAVENOUS | Status: DC | PRN
  Administered 2021-02-26: 10 mL
  Filled 2021-02-26: qty 10

## 2021-02-26 MED ORDER — SODIUM CHLORIDE 0.9 % IV SOLN
Freq: Once | INTRAVENOUS | Status: AC
Start: 2021-02-26 — End: 2021-02-26
  Filled 2021-02-26: qty 250

## 2021-02-26 MED ORDER — DIPHENHYDRAMINE HCL 25 MG PO CAPS
ORAL_CAPSULE | ORAL | Status: AC
  Filled 2021-02-26: qty 2

## 2021-02-26 MED ORDER — ACETAMINOPHEN 325 MG PO TABS
650.0000 mg | ORAL_TABLET | Freq: Once | ORAL | Status: AC
  Administered 2021-02-26: 650 mg via ORAL

## 2021-02-26 MED ORDER — ACETAMINOPHEN 325 MG PO TABS
ORAL_TABLET | ORAL | Status: AC
  Filled 2021-02-26: qty 2

## 2021-02-26 MED ORDER — HEPARIN SOD (PORK) LOCK FLUSH 100 UNIT/ML IV SOLN
500.0000 [IU] | Freq: Once | INTRAVENOUS | Status: AC | PRN
Start: 2021-02-26 — End: 2021-02-26
  Administered 2021-02-26: 500 [IU]
  Filled 2021-02-26: qty 5

## 2021-02-26 MED ORDER — SODIUM CHLORIDE 0.9% FLUSH
10.0000 mL | INTRAVENOUS | Status: DC | PRN
  Administered 2021-02-26: 10 mL via INTRAVENOUS
  Filled 2021-02-26: qty 10

## 2021-02-26 MED ORDER — SODIUM CHLORIDE 0.9 % IV SOLN
3.6000 mg/kg | Freq: Once | INTRAVENOUS | Status: AC
  Administered 2021-02-26: 280 mg via INTRAVENOUS
  Filled 2021-02-26: qty 14

## 2021-02-26 NOTE — Patient Instructions (Signed)
[facility] MEDICAL [facility]COL[facility]  Discharge Instructions: °Thank you for choosing [facility] to provide your oncology and hematology care.  ° °If you have a lab appointment with the Cancer Center, please go directly to the Cancer Center and check in at the registration area. °  °Wear comfortable clothing and clothing appropriate for easy access to any Portacath or PICC line.  ° °We strive to give you quality time with your provider. You may need to reschedule your appointment if you arrive late (15 or more minutes).  Arriving late affects you and other patients whose appointments are after yours.  Also, if you miss three or more appointments without notifying the office, you may be dismissed from the clinic at the provider’s discretion.    °  °For prescription refill requests, have your pharmacy contact our office and allow 72 hours for refills to be completed.   ° °Today you received the following chemotherapy and/or immunotherapy agents Kadcyla    °  °To help prevent nausea and vomiting after your treatment, we encourage you to take your nausea medication as directed. ° °BELOW ARE SYMPTOMS THAT SHOULD BE REPORTED IMMEDIATELY: °*FEVER GREATER THAN 100.4 F (38 °C) OR HIGHER °*CHILLS OR SWEATING °*NAUSEA AND VOMITING THAT IS NOT CONTROLLED WITH YOUR NAUSEA MEDICATION °*UNUSUAL SHORTNESS OF BREATH °*UNUSUAL BRUISING OR BLEEDING °*URINARY PROBLEMS (pain or burning when urinating, or frequent urination) °*BOWEL PROBLEMS (unusual diarrhea, constipation, pain near the anus) °TENDERNESS IN MOUTH AND THROAT WITH OR WITHOUT PRESENCE OF ULCERS (sore throat, sores in mouth, or a toothache) °UNUSUAL RASH, SWELLING OR PAIN  °UNUSUAL VAGINAL DISCHARGE OR ITCHING  ° °Items with * indicate a potential emergency and should be followed up as soon as possible or go to the Emergency Department if any problems should occur. ° °Please show the CHEMOTHERAPY ALERT CARD or IMMUNOTHERAPY ALERT CARD at check-in to the  Emergency Department and triage nurse. ° °Should you have questions after your visit or need to cancel or reschedule your appointment, please contact [facility]  Dept: [phone]  and follow the prompts.  Office hours are 8:00 a.m. to 4:30 p.m. Monday - Friday. Please note that voicemails left after 4:00 p.m. may not be returned until the following business day.  We are closed weekends and major holidays. You have access to a nurse at all times for urgent questions. Please call the main number to the clinic Dept: [phone] and follow the prompts. ° ° °For any non-urgent questions, you may also contact your provider using MyChart. We now offer e-Visits for anyone 18 and older to request care online for non-urgent symptoms. For details visit [email or web address]. °  °Also download the MyChart app! Go to the app store, search "MyChart", open the app, select , and log in with your MyChart username and password. ° °Due to Covid, a mask is required upon entering the hospital/clinic. If you do not have a mask, one will be given to you upon arrival. For doctor visits, patients may have 1 support person aged 18 or older with them. For treatment visits, patients cannot have anyone with them due to current Covid guidelines and our immunocompromised population.  ° °

## 2021-03-01 ENCOUNTER — Inpatient Hospital Stay: Payer: Medicare Other

## 2021-03-03 ENCOUNTER — Telehealth: Payer: Self-pay | Admitting: Hematology and Oncology

## 2021-03-03 NOTE — Telephone Encounter (Signed)
Scheduled per 5/17 staff msg. Called and spoke with pt, confirmed 6/3 and 6/23 appts

## 2021-03-09 ENCOUNTER — Ambulatory Visit (INDEPENDENT_AMBULATORY_CARE_PROVIDER_SITE_OTHER): Payer: Medicare Other

## 2021-03-09 DIAGNOSIS — Z0000 Encounter for general adult medical examination without abnormal findings: Secondary | ICD-10-CM

## 2021-03-09 NOTE — Progress Notes (Signed)
MEDICARE ANNUAL WELLNESS VISIT  03/09/2021  Telephone Visit Disclaimer This Medicare AWV was conducted by telephone due to national recommendations for restrictions regarding the COVID-19 Pandemic (e.g. social distancing).  I verified, using two identifiers, that I am speaking with [name] Riley or their authorized healthcare agent. I discussed the limitations, risks, security, and privacy concerns of performing an evaluation and management service by telephone and the potential availability of an in-person appointment in the future. The patient expressed understanding and agreed to proceed.  Location of Patient: Home  Location of Provider (nurse):  [facility]  Subjective:    [name] Riley is a 70 y.o. female patient of Stacks, Cletus Gash, MD who had a Medicare Annual Wellness Visit today via telephone. [name] Riley lives in nearby [place] with her husband. They have 3 children together. Their youngest passed away in 28-Dec-2014. She is retired. 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 She enjoys yard work, working in her flowers, reading, and adult Architectural technologist. She walks for exercise. She is a very pleasant lady.   Patient Care Team: [name], MD as PCP - General (Family Medicine) Minus Breeding, MD as PCP - Cardiology (Cardiology) Nicholas Lose, MD as Consulting Physician (Hematology and Oncology)  Advanced Directives 03/09/2021 02/18/2021 11/27/2020 11/09/2020 10/02/2020 08/17/2020 07/29/2020  Does Patient Have a Medical Advance Directive? No No No No No No No  Type of Advance Directive - - - - - - -  Does patient want to make changes to medical advance directive? - - - - - No - Patient declined -  Would patient like information on creating a medical advance directive? No - Patient declined No - Patient declined No - Patient declined No - Patient declined No - Patient declined No - Patient declined No - Patient declined  Pre-existing out  of facility DNR order (yellow form or pink MOST form) - - - - - - -    Hospital Utilization Over the Past 12 Months: # of hospitalizations or ER visits: 0 # of surgeries: 0  Review of Systems    Patient reports that her overall health is unchanged compared to last year.  History obtained from chart review  Patient Reported Readings (BP, Pulse, CBG, Weight, etc) none  Pain Assessment Pain : No/denies pain     Current Medications & Allergies (verified) Allergies as of 03/09/2021      Reactions   Cantaloupe Extract Allergy Skin Test Shortness Of Breath   Contrast Media [iodinated Diagnostic Agents] Shortness Of Breath, Rash   Pravastatin Other (See Comments)   Legs hurt   Zosyn [piperacillin Sod-tazobactam So] Rash, Other (See Comments)   Temperature increase, facial flushing   Latex Rash   Redness, itch      Medication List       Accurate as of Mar 09, 2021  2:18 PM. If you have any questions, ask your nurse or doctor.        acetaminophen 500 MG tablet Commonly known as: TYLENOL Take 1,000 mg by mouth every 6 (six) hours as needed for moderate pain.   cholecalciferol 1000 units tablet Commonly known as: VITAMIN D Take 1 tablet (1,000 Units total) by mouth daily.   dexamethasone 2 MG tablet Commonly known as: DECADRON TAKE TWO TABLETS BY MOUTH DAILY   DSS 100 MG Caps Take by mouth.   furosemide 20 MG tablet Commonly known as: LASIX TAKE ONE (1) TABLET BY MOUTH EVERY DAY  letrozole 2.5 MG tablet Commonly known as: FEMARA TAKE ONE (1) TABLET EACH DAY   levETIRAcetam 500 MG tablet Commonly known as: KEPPRA Take 1 tablet (500 mg total) by mouth 2 (two) times daily.   levothyroxine 100 MCG tablet Commonly known as: SYNTHROID TAKE ONE TABLET EACH MORNING BEFORE BREAKFAST   lidocaine-prilocaine cream Commonly known as: EMLA APPLY TOPICALLY AS NEEDED FOR PORT ACCESS   metoprolol succinate 100 MG 24 hr tablet Commonly known as: TOPROL-XL TAKE ONE (1)  TABLET EACH DAY   multivitamin with minerals tablet Take 1 tablet by mouth daily.   ondansetron 4 MG disintegrating tablet Commonly known as: ZOFRAN-ODT TAKE 1 TABLET EVERY 6 HOURS AS NEEDED FOR NAUSEA AND VOMITING   potassium chloride SA 20 MEQ tablet Commonly known as: KLOR-CON Take 1 tablet (20 mEq total) by mouth daily for 360 doses.   rosuvastatin 5 MG tablet Commonly known as: CRESTOR Take 1 tablet (5 mg total) by mouth daily. (new directions)   triamcinolone 0.025 % ointment Commonly known as: KENALOG Apply 1 application topically 2 (two) times daily.   valsartan 80 MG tablet Commonly known as: DIOVAN TAKE ONE (1) TABLET EACH DAY   Verzenio 100 MG tablet Generic drug: abemaciclib TAKE 1 TABLET BY MOUTH 2 TIMES DAILY. SWALLOW TABLETS WHOLE. DO NOT CHEW, CRUSH, OR SPLIT TABLETS BEFORE SWALLOWING.   VITAMIN C PO Take by mouth.       History (reviewed): Past Medical History:  Diagnosis Date  . Allergy   . Breast cancer ([place]) 08/25/2011   L , invasive ductal carcinoma, ER/PR +,HER2 -  . Cancer [facility])    left breast cancer  . Coronary artery disease 2001  . Heart attack [facility] 09/2000   Sep 25, 2000  --no intervention  . History of chemotherapy comp. 08/22/2012   4 cycles of FEC and $ cycles of Taxotere  . Hyperlipidemia   . Hypertension   . Hypothyroidism   . PONV (postoperative nausea and vomiting)    gets sick from anesthesia  . Status post radiation therapy 07/09/12 - 08/22/2012   Left Breast, 60.4 gray   Past Surgical History:  Procedure Laterality Date  . ABDOMINAL HYSTERECTOMY  1998   TAH, oophorectomy  . APPENDECTOMY  1970  . BREAST CYST EXCISION Left 07/29/2020   Procedure: WIDE EXCISION OF LEFT MASTECTOMY INCISION;  Surgeon: Donnie Mesa, MD;  Location: [place];  Service: General;  Laterality: Left;  . BREAST SURGERY  1998   removal of benign lump in rt breast  . BREAST SURGERY  11/14/11   right simple mastectomy, left mrm  .  INCISION AND DRAINAGE OF WOUND Left 07/01/2017   Procedure: IRRIGATION AND DEBRIDEMENT CHEST WALL ABSCESS;  Surgeon: Donnie Mesa, MD;  Location: [facility];  Service: General;  Laterality: Left;  Marland Kitchen MASS EXCISION Left 06/22/2017   Procedure: EXCISION OF CHEST WALL MASSES;  Surgeon: Donnie Mesa, MD;  Location: [place];  Service: General;  Laterality: Left;  Marland Kitchen MASTECTOMY Bilateral    for left breast cancer  . OVARIAN CYST SURGERY Right 1970  . PORT-A-CATH REMOVAL  08/30/2012   Procedure: REMOVAL PORT-A-CATH;  Surgeon: Imogene Burn. [name], MD;  Location: [place];  Service: General;  Laterality: Right;  port removal  . PORTACATH PLACEMENT  11/14/2011   Procedure: INSERTION PORT-A-CATH;  Surgeon: Imogene Burn. [name], MD;  Location: [place];  Service: General;  Laterality: Right;  . PORTACATH PLACEMENT N/A 04/25/2016   Procedure: INSERTION PORT-A-CATH LEFT CHEST;  Surgeon: Donnie Mesa, MD;  Location: [place];  Service: General;  Laterality: N/A;  . skin tags  05/09/1997   left axillary left neck skin tags  . TONSILLECTOMY  1968   Family History  Problem Relation Age of Onset  . Hypertension Maternal Grandmother   . Diabetes Maternal Grandmother   . Cancer Father 74       lung cancer and Prostate Cancer  . Hypertension Mother   . Cancer Paternal Aunt        ovarian  . Cancer Cousin        breast, paternal cousin  . Cancer Paternal Uncle        stomach  . Cancer Paternal Grandfather        Esophagus  . Colon cancer Neg Hx    Social History   Socioeconomic History  . Marital status: Married    Spouse name: Not on file  . Number of children: 3  . Years of education: Not on file  . Highest education level: High school graduate  Occupational History  . Occupation: Retired  Tobacco Use  . Smoking status: Never Smoker  . Smokeless tobacco: Never Used  Vaping Use  . Vaping Use: Never used  Substance and Sexual Activity  . Alcohol use: No  . Drug use: No  .  Sexual activity: Yes    Birth control/protection: Surgical    Comment: menarche 43, Parity age 76, G84, P3, 1 miscarriage,  HRT x 5-10 yrs, Mild Hot Flashes  Other Topics Concern  . Not on file  Social History Narrative   Lives at home.   Social Determinants of Health   Financial Resource Strain: Not on file  Food Insecurity: Not on file  Transportation Needs: Not on file  Physical Activity: Not on file  Stress: Not on file  Social Connections: Not on file    Activities of Daily Living In your present state of health, do you have any difficulty performing the following activities: 03/09/2021 12/11/2020  Hearing? N N  Vision? N N  Difficulty concentrating or making decisions? N N  Walking or climbing stairs? N N  Dressing or bathing? N N  Doing errands, shopping? N -  Preparing Food and eating ? N -  Using the Toilet? N -  In the past six months, have you accidently leaked urine? N -  Do you have problems with loss of bowel control? N -  Managing your Medications? N -  Managing your Finances? N -  Housekeeping or managing your Housekeeping? N -  Some recent data might be hidden    Patient Education/ Literacy How often do you need to have someone help you when you read instructions, pamphlets, or other written materials from your doctor or pharmacy?: 1 - Never What is the last grade level you completed in school?: 12th grade  Exercise Current Exercise Habits: The patient does not participate in regular exercise at present, Exercise limited by: Other - see comments  Diet Patient reports consuming 3 meals a day and 2 snack(s) a day Patient reports that her primary diet is: Regular Patient reports that she does have regular access to food.   Depression Screen PHQ 2/9 Scores 03/09/2021 02/09/2021 09/01/2020 02/18/2020 01/23/2020 12/12/2019 09/10/2019  PHQ - 2 Score 0 0 0 0 0 0 0  PHQ- 9 Score - - - - - - 0     Fall Risk Fall Risk  03/09/2021 02/09/2021 09/01/2020 02/18/2020  01/23/2020  Falls in  the past year? 1 0 0 1 1  Number falls in past yr: 0 - 0 0 0  Injury with Fall? 0 - 0 0 0  Risk for fall due to : History of fall(s) - No Fall Risks - History of fall(s)  Follow up Education provided - - - Falls evaluation completed     Objective:  [name] Riley seemed alert and oriented and she participated appropriately during our telephone visit.  Blood Pressure Weight BMI  BP Readings from Last 3 Encounters:  02/26/21 (!) 111/59  02/26/21 (!) 163/84  02/18/21 137/90   Wt Readings from Last 3 Encounters:  02/26/21 167 lb 8 oz (76 kg)  02/18/21 166 lb 11.2 oz (75.6 kg)  02/09/21 166 lb 3.2 oz (75.4 kg)   BMI Readings from Last 1 Encounters:  02/26/21 25.47 kg/m    *Unable to obtain current vital signs, weight, and BMI due to telephone visit type  Hearing/Vision  . [name] Riley did not seem to have difficulty with hearing/understanding during the telephone conversation . Reports that she has had a formal eye exam by an eye care professional within the past year . Reports that she has not had a formal hearing evaluation within the past year *Unable to fully assess hearing and vision during telephone visit type  Cognitive Function: 6CIT Screen 03/09/2021 02/18/2020  What Year? 0 points 0 points  What month? 0 points 0 points  What time? 0 points 0 points  Count back from 20 0 points 0 points  Months in reverse 0 points 0 points  Repeat phrase 0 points 0 points  Total Score 0 0   (Normal:0-7, Significant for Dysfunction: >8)  Normal Cognitive Function Screening: Yes   Immunization & Health Maintenance Record Immunization History  Administered Date(s) Administered  . Fluad Quad(high Dose 65+) 08/02/2019  . Influenza Split 10/24/2014  . Influenza,inj,Quad PF,6+ Mos 10/24/2014, 07/20/2016, 08/10/2018  . Influenza-Unspecified 10/05/2015, 06/13/2020  . Moderna Sars-Covid-2 Vaccination 11/22/2019, 12/21/2019, 06/13/2020  . Tdap 03/03/2015    Health  Maintenance  Topic Date Due  . COLONOSCOPY (Pts 45-61yr Insurance coverage will need to be confirmed)  05/30/2017  . COVID-19 Vaccine (4 - Booster for Moderna series) 09/13/2020  . PNA vac Low Risk Adult (1 of 2 - PCV13) 02/09/2022 (Originally 11/08/2017)  . INFLUENZA VACCINE  05/17/2021  . TETANUS/TDAP  03/02/2025  . DEXA SCAN  Completed  . Hepatitis C Screening  Completed  . HPV VACCINES  Aged Out  . MAMMOGRAM  Discontinued       Assessment  This is a routine wellness examination for CHaruye Riley.  Health Maintenance: Due or Overdue Health Maintenance Due  Topic Date Due  . COLONOSCOPY (Pts 45-443yrInsurance coverage will need to be confirmed)  05/30/2017  . COVID-19 Vaccine (4 - Booster for Moderna series) 09/13/2020    CaMelvyn Riley does not need a referral for Community Assistance: Care Management:   no Social Work:    no Prescription Assistance:  no Nutrition/Diabetes Education:  no   Plan:  Personalized Goals Goals Addressed   None    Personalized Health Maintenance & Screening Recommendations  Colorectal cancer screening  Lung Cancer Screening Recommended: no (Low Dose CT Chest recommended if Age 68-80ears, 30 pack-year currently smoking OR have quit w/in past 15 years) Hepatitis C Screening recommended: Done 06-30-17 HIV Screening recommended: no  Advanced Directives: Written information was not prepared per patient's request.  Referrals & Orders No orders of the defined types  were placed in this encounter.   Follow-up Plan . Follow-up with [name], MD as planned . Patient has her colonoscopies through [facility]. She will contact them to make an appointment    I have personally reviewed and noted the following in the patient's chart:   . Medical and social history . Use of alcohol, tobacco or illicit drugs  . Current medications and supplements . Functional ability and status . Nutritional status . Physical activity . Advanced  directives . List of other physicians . Hospitalizations, surgeries, and ER visits in previous 12 months . Vitals . Screenings to include cognitive, depression, and falls . Referrals and appointments  In addition, I have reviewed and discussed with [name] Riley certain preventive protocols, quality metrics, and best practice recommendations. A written personalized care plan for preventive services as well as general preventive health recommendations is available and can be mailed to the patient at her request.      [name] Infante LPN [id number]

## 2021-03-10 ENCOUNTER — Other Ambulatory Visit: Payer: Self-pay

## 2021-03-10 ENCOUNTER — Encounter: Payer: Self-pay | Admitting: Hematology and Oncology

## 2021-03-10 ENCOUNTER — Encounter: Payer: Self-pay | Admitting: Nurse Practitioner

## 2021-03-10 ENCOUNTER — Ambulatory Visit (INDEPENDENT_AMBULATORY_CARE_PROVIDER_SITE_OTHER): Payer: Medicare Other | Admitting: Nurse Practitioner

## 2021-03-10 ENCOUNTER — Ambulatory Visit (INDEPENDENT_AMBULATORY_CARE_PROVIDER_SITE_OTHER): Payer: Medicare Other

## 2021-03-10 VITALS — BP 117/84 | HR 114 | Temp 97.8°F

## 2021-03-10 DIAGNOSIS — W19XXXA Unspecified fall, initial encounter: Secondary | ICD-10-CM

## 2021-03-10 DIAGNOSIS — M25552 Pain in left hip: Secondary | ICD-10-CM

## 2021-03-10 NOTE — Patient Instructions (Signed)
Hip Pain The hip is the joint between the upper legs and the lower pelvis. The bones, cartilage, tendons, and muscles of your hip joint support your body and allow you to move around. Hip pain can range from a minor ache to severe pain in one or both of your hips. The pain may be felt on the inside of the hip joint near the groin, or on the outside near the buttocks and upper thigh. You may also have swelling or stiffness in your hip area. Follow these instructions at home: Managing pain, stiffness, and swelling  If directed, put ice on the painful area. To do this: ? Put ice in a plastic bag. ? Place a towel between your skin and the bag. ? Leave the ice on for 20 minutes, 2-3 times a day.  If directed, apply heat to the affected area as often as told by your health care provider. Use the heat source that your health care provider recommends, such as a moist heat pack or a heating pad. ? Place a towel between your skin and the heat source. ? Leave the heat on for 20-30 minutes. ? Remove the heat if your skin turns bright red. This is especially important if you are unable to feel pain, heat, or cold. You may have a greater risk of getting burned.      Activity  Do exercises as told by your health care provider.  Avoid activities that cause pain. General instructions  Take over-the-counter and prescription medicines only as told by your health care provider.  Keep a journal of your symptoms. Write down: ? How often you have hip pain. ? The location of your pain. ? What the pain feels like. ? What makes the pain worse.  Sleep with a pillow between your legs on your most comfortable side.  Keep all follow-up visits as told by your health care provider. This is important.   Contact a health care provider if:  You cannot put weight on your leg.  Your pain or swelling continues or gets worse after one week.  It gets harder to walk.  You have a fever. Get help right away  if:  You fall.  You have a sudden increase in pain and swelling in your hip.  Your hip is red or swollen or very tender to touch. Summary  Hip pain can range from a minor ache to severe pain in one or both of your hips.  The pain may be felt on the inside of the hip joint near the groin, or on the outside near the buttocks and upper thigh.  Avoid activities that cause pain.  Write down how often you have hip pain, the location of the pain, what makes it worse, and what it feels like. This information is not intended to replace advice given to you by your health care provider. Make sure you discuss any questions you have with your health care provider. Document Revised: 02/18/2019 Document Reviewed: 02/18/2019 Elsevier Patient Education  2021 [employer]  

## 2021-03-11 DIAGNOSIS — W19XXXA Unspecified fall, initial encounter: Secondary | ICD-10-CM | POA: Insufficient documentation

## 2021-03-11 DIAGNOSIS — M25552 Pain in left hip: Secondary | ICD-10-CM | POA: Insufficient documentation

## 2021-03-11 NOTE — Progress Notes (Signed)
Acute Office Visit  Subjective:    Patient ID: [name] Riley, female    DOB: 06-05-1951, 70 y.o.   MRN: [id number]  Chief Complaint  Patient presents with  . Hip Pain  . Fall    Hip Pain  The incident occurred 12 to 24 hours ago. The incident occurred at home. The injury mechanism was a fall. The pain is present in the left hip. The pain is mild. The pain has been intermittent since onset. Associated symptoms include muscle weakness. Pertinent negatives include no numbness or tingling. She reports no foreign bodies present. The symptoms are aggravated by movement. She has tried acetaminophen for the symptoms. The treatment provided moderate relief.  Fall The accident occurred 12 to 24 hours ago. The fall occurred from a bed. She landed on carpet. There was no blood loss. The point of impact was the head and left hip. The pain is present in the left hip. The pain is mild. The symptoms are aggravated by ambulation and movement. Pertinent negatives include no abdominal pain, bowel incontinence, fever, headaches, numbness, tingling or visual change. She has tried acetaminophen for the symptoms. The treatment provided mild relief.   Patient is in today for follow-up fall that happened at home, patient was unable to break the fall and slid to the floor hitting her head and left hip.  She reports mild to moderate pain in left hip area which using Tylenol has provided therapeutic effect.  No bruising, fever, headache or visual impairment. Fall Risk  03/09/2021 02/09/2021 09/01/2020 02/18/2020 01/23/2020  Falls in the past year? 1 0 0 1 1  Number falls in past yr: 0 - 0 0 0  Injury with Fall? 0 - 0 0 0  Risk for fall due to : History of fall(s) - No Fall Risks - History of fall(s)  Follow up Education provided - - - Falls evaluation completed    Past Medical History:  Diagnosis Date  . Allergy   . Breast cancer ([place]) 08/25/2011   L , invasive ductal carcinoma, ER/PR +,HER2 -  . Cancer [facility])     left breast cancer  . Coronary artery disease 2001  . Heart attack [facility]) 09/2000   Sep 25, 2000  --no intervention  . History of chemotherapy comp. 08/22/2012   4 cycles of FEC and $ cycles of Taxotere  . Hyperlipidemia   . Hypertension   . Hypothyroidism   . PONV (postoperative nausea and vomiting)    gets sick from anesthesia  . Status post radiation therapy 07/09/12 - 08/22/2012   Left Breast, 60.4 gray    Past Surgical History:  Procedure Laterality Date  . ABDOMINAL HYSTERECTOMY  1998   TAH, oophorectomy  . APPENDECTOMY  1970  . BREAST CYST EXCISION Left 07/29/2020   Procedure: WIDE EXCISION OF LEFT MASTECTOMY INCISION;  Surgeon: Donnie Mesa, MD;  Location: [facility];  Service: General;  Laterality: Left;  . BREAST SURGERY  1998   removal of benign lump in rt breast  . BREAST SURGERY  11/14/11   right simple mastectomy, left mrm  . INCISION AND DRAINAGE OF WOUND Left 07/01/2017   Procedure: IRRIGATION AND DEBRIDEMENT CHEST WALL ABSCESS;  Surgeon: Donnie Mesa, MD;  Location: [facility];  Service: General;  Laterality: Left;  Marland Kitchen MASS EXCISION Left 06/22/2017   Procedure: EXCISION OF CHEST WALL MASSES;  Surgeon: Donnie Mesa, MD;  Location: [place];  Service: General;  Laterality: Left;  Marland Kitchen MASTECTOMY Bilateral  for left breast cancer  . OVARIAN CYST SURGERY Right 1970  . PORT-A-CATH REMOVAL  08/30/2012   Procedure: REMOVAL PORT-A-CATH;  Surgeon: Imogene Burn. [name], MD;  Location: [place];  Service: General;  Laterality: Right;  port removal  . PORTACATH PLACEMENT  11/14/2011   Procedure: INSERTION PORT-A-CATH;  Surgeon: Imogene Burn. [name], MD;  Location: [place];  Service: General;  Laterality: Right;  . PORTACATH PLACEMENT N/A 04/25/2016   Procedure: INSERTION PORT-A-CATH LEFT CHEST;  Surgeon: Donnie Mesa, MD;  Location: [place];  Service: General;  Laterality: N/A;  . skin tags  05/09/1997   left axillary left neck skin tags  .  TONSILLECTOMY  1968    Family History  Problem Relation Age of Onset  . Hypertension Maternal Grandmother   . Diabetes Maternal Grandmother   . Cancer Father 17       lung cancer and Prostate Cancer  . Hypertension Mother   . Cancer Paternal Aunt        ovarian  . Cancer Cousin        breast, paternal cousin  . Cancer Paternal Uncle        stomach  . Cancer Paternal Grandfather        Esophagus  . Colon cancer Neg Hx     Social History   Socioeconomic History  . Marital status: Married    Spouse name: Not on file  . Number of children: 3  . Years of education: Not on file  . Highest education level: High school graduate  Occupational History  . Occupation: Retired  Tobacco Use  . Smoking status: Never Smoker  . Smokeless tobacco: Never Used  Vaping Use  . Vaping Use: Never used  Substance and Sexual Activity  . Alcohol use: No  . Drug use: No  . Sexual activity: Yes    Birth control/protection: Surgical    Comment: menarche 57, Parity age 89, G39, P3, 1 miscarriage,  HRT x 5-10 yrs, Mild Hot Flashes  Other Topics Concern  . Not on file  Social History Narrative   Lives at home.   Social Determinants of Health   Financial Resource Strain: Not on file  Food Insecurity: Not on file  Transportation Needs: Not on file  Physical Activity: Not on file  Stress: Not on file  Social Connections: Not on file  Intimate Partner Violence: Not on file    Outpatient Medications Prior to Visit  Medication Sig Dispense Refill  . abemaciclib (VERZENIO) 100 MG tablet TAKE 1 TABLET BY MOUTH 2 TIMES DAILY. SWALLOW TABLETS WHOLE. DO NOT CHEW, CRUSH, OR SPLIT TABLETS BEFORE SWALLOWING. 56 tablet 3  . acetaminophen (TYLENOL) 500 MG tablet Take 1,000 mg by mouth every 6 (six) hours as needed for moderate pain.    . Ascorbic Acid (VITAMIN C PO) Take by mouth.    . cholecalciferol (VITAMIN D) 1000 units tablet Take 1 tablet (1,000 Units total) by mouth daily.    Mariane Baumgarten Sodium  (DSS) 100 MG CAPS Take by mouth.    . furosemide (LASIX) 20 MG tablet TAKE ONE (1) TABLET BY MOUTH EVERY DAY 30 tablet 1  . letrozole (FEMARA) 2.5 MG tablet TAKE ONE (1) TABLET EACH DAY 90 tablet 3  . levETIRAcetam (KEPPRA) 500 MG tablet Take 1 tablet (500 mg total) by mouth 2 (two) times daily. 60 tablet 3  . levothyroxine (SYNTHROID) 100 MCG tablet TAKE ONE TABLET EACH MORNING BEFORE BREAKFAST 90 tablet 2  .  lidocaine-prilocaine (EMLA) cream APPLY TOPICALLY AS NEEDED FOR PORT ACCESS 30 g 3  . metoprolol succinate (TOPROL-XL) 100 MG 24 hr tablet TAKE ONE (1) TABLET EACH DAY 90 tablet 1  . Multiple Vitamins-Minerals (MULTIVITAMIN WITH MINERALS) tablet Take 1 tablet by mouth daily.    . ondansetron (ZOFRAN-ODT) 4 MG disintegrating tablet TAKE 1 TABLET EVERY 6 HOURS AS NEEDED FOR NAUSEA AND VOMITING 30 tablet 3  . potassium chloride SA (KLOR-CON) 20 MEQ tablet Take 1 tablet (20 mEq total) by mouth daily for 360 doses. 30 tablet 11  . rosuvastatin (CRESTOR) 5 MG tablet Take 1 tablet (5 mg total) by mouth daily. (new directions) 90 tablet 1  . triamcinolone (KENALOG) 0.025 % ointment Apply 1 application topically 2 (two) times daily. 80 g 1  . valsartan (DIOVAN) 80 MG tablet TAKE ONE (1) TABLET EACH DAY 90 tablet 1  . dexamethasone (DECADRON) 2 MG tablet TAKE TWO TABLETS BY MOUTH DAILY 60 tablet 0   No facility-administered medications prior to visit.    Allergies  Allergen Reactions  . Cantaloupe Extract Allergy Skin Test Shortness Of Breath  . Contrast Media [Iodinated Diagnostic Agents] Shortness Of Breath and Rash  . Pravastatin Other (See Comments)    Legs hurt  . Zosyn [Piperacillin Sod-Tazobactam So] Rash and Other (See Comments)    Temperature increase, facial flushing  . Latex Rash    Redness, itch     Review of Systems  Constitutional: Negative for fever.  Cardiovascular: Negative.   Gastrointestinal: Negative for abdominal pain and bowel incontinence.  Genitourinary:  Negative.   Musculoskeletal: Negative.   Skin: Negative for rash and wound.  Neurological: Negative for tingling, numbness and headaches.       Objective:    Physical Exam Vitals and nursing note reviewed.  Constitutional:      Appearance: Normal appearance.  HENT:     Head: Normocephalic.     Nose: Nose normal.  Eyes:     Conjunctiva/sclera: Conjunctivae normal.  Cardiovascular:     Pulses: Normal pulses.     Heart sounds: Normal heart sounds.  Pulmonary:     Effort: Pulmonary effort is normal.     Breath sounds: Normal breath sounds.  Abdominal:     General: Bowel sounds are normal.  Musculoskeletal:     Left upper leg: Tenderness present. No edema or deformity.  Skin:    Findings: No erythema or rash.  Neurological:     Mental Status: She is alert and oriented to person, place, and time.  Psychiatric:        Behavior: Behavior normal.     BP 117/84   Pulse (!) 114   Temp 97.8 F (36.6 C) (Temporal)   SpO2 97%  Wt Readings from Last 3 Encounters:  02/26/21 167 lb 8 oz (76 kg)  02/18/21 166 lb 11.2 oz (75.6 kg)  02/09/21 166 lb 3.2 oz (75.4 kg)    Health Maintenance Due  Topic Date Due  . Zoster Vaccines- Shingrix (1 of 2) Never done  . COLONOSCOPY (Pts 45-35yr Insurance coverage will need to be confirmed)  05/30/2017  . COVID-19 Vaccine (4 - Booster for Moderna series) 09/13/2020    There are no preventive care reminders to display for this patient.   Lab Results  Component Value Date   TSH 0.457 02/09/2021   Lab Results  Component Value Date   WBC 3.9 (L) 02/26/2021   HGB 10.8 (L) 02/26/2021   HCT 32.6 (L) 02/26/2021   MCV  110.1 (H) 02/26/2021   PLT 105 (L) 02/26/2021   Lab Results  Component Value Date   NA 137 02/26/2021   K 3.6 02/26/2021   CHLORIDE 104 09/22/2017   CO2 29 02/26/2021   GLUCOSE 89 02/26/2021   BUN 22 02/26/2021   CREATININE 0.80 02/26/2021   BILITOT 0.9 02/26/2021   ALKPHOS 220 (H) 02/26/2021   AST 67 (H)  02/26/2021   ALT 59 (H) 02/26/2021   PROT 6.3 (L) 02/26/2021   ALBUMIN 2.9 (L) 02/26/2021   CALCIUM 9.3 02/26/2021   ANIONGAP 7 02/26/2021   EGFR 56 (L) 02/09/2021   Lab Results  Component Value Date   CHOL 297 (H) 02/09/2021   Lab Results  Component Value Date   HDL 126 02/09/2021   Lab Results  Component Value Date   LDLCALC 155 (H) 02/09/2021   Lab Results  Component Value Date   TRIG 99 02/09/2021   Lab Results  Component Value Date   CHOLHDL 2.4 02/09/2021   Lab Results  Component Value Date   HGBA1C 5.8 (H) 10/24/2014       Assessment & Plan:   Problem List Items Addressed This Visit      Other   Fall - Primary    Initial encounter for fall.  Completed x-ray of left hip results pending.  Provided education on fall prevention and home safety.  Printed handouts given.      Relevant Orders   DG HIP UNILAT W OR W/O PELVIS 2-3 VIEWS LEFT (Completed)   Left hip pain    Left hip pain in the last 24 hours from a fall.  Completed left hip x-ray with results showing Sclerotic bone lesions throughout the pelvis and in the bilateral proximal femora.There is a lucency through the lateral left acetabulum which is chronic based on a 2020 and 2021 CT. No acute fracture or dislocation seen.  Results discussed with patient in clinic.  Symptom management continue with Tylenol, rest joint, follow-up with worsening or unresolved symptoms.  Keep appointment with oncologist.          No orders of the defined types were placed in this encounter.    Ivy Lynn, NP

## 2021-03-11 NOTE — Assessment & Plan Note (Signed)
Left hip pain in the last 24 hours from a fall.  Completed left hip x-ray with results showing Sclerotic bone lesions throughout the pelvis and in the bilateral proximal femora.There is a lucency through the lateral left acetabulum which is chronic based on a 2020 and 2021 CT. No acute fracture or dislocation seen.  Results discussed with patient in clinic.  Symptom management continue with Tylenol, rest joint, follow-up with worsening or unresolved symptoms.  Keep appointment with oncologist.

## 2021-03-11 NOTE — Assessment & Plan Note (Signed)
Initial encounter for fall.  Completed x-ray of left hip results pending.  Provided education on fall prevention and home safety.  Printed handouts given.

## 2021-03-12 ENCOUNTER — Other Ambulatory Visit (HOSPITAL_COMMUNITY): Payer: Self-pay

## 2021-03-16 ENCOUNTER — Other Ambulatory Visit: Payer: Self-pay | Admitting: Hematology and Oncology

## 2021-03-16 ENCOUNTER — Other Ambulatory Visit (HOSPITAL_COMMUNITY): Payer: Self-pay

## 2021-03-16 DIAGNOSIS — Z170 Estrogen receptor positive status [ER+]: Secondary | ICD-10-CM

## 2021-03-16 DIAGNOSIS — C50412 Malignant neoplasm of upper-outer quadrant of left female breast: Secondary | ICD-10-CM

## 2021-03-16 MED ORDER — ABEMACICLIB 100 MG PO TABS
ORAL_TABLET | ORAL | 3 refills | Status: DC
  Filled 2021-03-16: qty 56, 28d supply, fill #0
  Filled 2021-04-26: qty 56, 28d supply, fill #1
  Filled 2021-06-02: qty 56, 28d supply, fill #2
  Filled 2021-06-29: qty 56, 28d supply, fill #3
  Filled ????-??-??: fill #1

## 2021-03-17 ENCOUNTER — Telehealth: Payer: Self-pay | Admitting: *Deleted

## 2021-03-17 NOTE — Telephone Encounter (Signed)
Received call from pt with complaint of numbness and tingling in bilateral hands.  Pt has apt with MD this Friday.  RN encouraged pt to discuss with MD during visit.  Pt verbalized understanding.

## 2021-03-18 ENCOUNTER — Other Ambulatory Visit (HOSPITAL_COMMUNITY): Payer: Self-pay

## 2021-03-18 NOTE — Progress Notes (Signed)
Patient Care Team: [name], MD as PCP - General (Family Medicine) Minus Breeding, MD as PCP - Cardiology (Cardiology) Nicholas Lose, MD as Consulting Physician (Hematology and Oncology)  DIAGNOSIS:    ICD-10-CM   1. Metastatic breast cancer ([place])  C50.919 CT CHEST ABDOMEN PELVIS WO CONTRAST  2. Brain metastasis ([place])  C79.31 CT CHEST ABDOMEN PELVIS WO CONTRAST  3. Malignant neoplasm of upper-outer quadrant of left breast in female, estrogen receptor positive ([place])  C50.412 CT CHEST ABDOMEN PELVIS WO CONTRAST   Z17.0   4. Metastasis to bone (H[facility])  C79.51 CT CHEST ABDOMEN PELVIS WO CONTRAST  5. 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 stage IIB   12/13/2011 - 06/28/2012 Chemotherapy   4 cycles of FEC followed by 4 cycles of Taxotere   07/17/2012 - 08/22/2012 Radiation Therapy   Adjuvant radiation therapy   08/22/2012 - 03/16/2016 Anti-estrogen oral therapy   Arimidex 1 mg daily   03/16/2016 Relapse/Recurrence   Subcutaneous nodule excision left chest: Infiltrating carcinoma breast primary, ER positive, PR negative   03/29/2016 Imaging   CT CAP and bone scan: Lytic lesions T8 vertebral, T1 posterior element, subcutaneous nodule left lateral chest wall, nonspecific lung nodules; Bone scan: Mets to kull, left humerus, left eighth rib, T7/T8, sternum, left acetabulum   04/28/2016 - 06/17/2017 Chemotherapy   Herceptin, lapatinib, Faslodex, Zometa every 4 weeks, lapatinib discontinued in September 2018 due to elevation of LFTs   06/22/2017 Relapse/Recurrence   Surgical excision:Soft tissue mass left lateral chest wall  primary breast cancer, soft tissue mass left medial chest wall breast cancer, tumor is within the dermis extending to the subcutaneous adipose tissue and involves portions of skeletal muscle   06/22/2017 Cancer Staging   Staging form: Breast, AJCC 7th Edition - Pathologic stage from 06/22/2017: Stage IV (TX, NX, M1) - Signed by Nicholas Lose, MD on 12/27/2019   08/2017 - 02/16/2018 Chemotherapy   Faslodex with Herceptin and Perjeta along with Zometa every 4 weeks   03/02/2018 - 05/25/2018 Chemotherapy   Kadcyla   05/11/2018 Imaging   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 Associated vasogenic edema within the underlying right cerebral hemisphere without significant midline shift. Signal abnormality throughout the visualized bone marrow, compatible with osseous metastatic disease.    06/04/2018 Imaging   CT CAP: Right lower lobe lung nodule 7 mm (was 5 mm); multiple bone metastases throughout the spine and ribs sternum scapula and humerus, slightly increased lower thoracic mets, right renal lesion 2.5 cm (was 1.2 cm) right femur met increased from 2.1 cm to 2.9 cm   06/15/2018 -  Anti-estrogen oral therapy   Abemaciclib, Herceptin, letrozole, Xgeva   06/07/2019 Imaging   Progression of brain metastases,2 discrete dural-based lesions involving the posterior right frontal lobe. The more anterior lesion now measures 16.5 x 17.5 x 14 mm. The posterior lesion now measures 9.5 x 13 x 20 mm.  Vasogenic edema of the frontal and parietal lobes   06/21/2019 - 06/27/2019 Radiation Therapy   SRS to the brain   07/19/2020 Relapse/Recurrence   Left mastectomy scar nodule: Biopsy invasive carcinoma ER 60%, PR  0%, HER-2 negative by [facility]   07/29/2020 Surgery   Soft tissue mass left chest wall excision: No evidence of malignancy.   11/27/2020 - 02/05/2021 Chemotherapy      Patient is on Antibody Plan: BREAST ADO-TRASTUZUMAB EMTANSINE ([place]) Q21D    01/15/2021 -  Chemotherapy       Patient is on Antibody Plan: BREAST ADO-TRASTUZUMAB EMTANSINE ([place]) Q21D    Metastatic breast cancer ([place])  06/29/2017 Initial Diagnosis   Metastatic breast cancer ([place])   06/15/2018 - 12/25/2020 Chemotherapy    Patient is on Treatment Plan: BRAIN GBM BEVACIZUMAB 14D X 6 CYCLES      01/15/2021 -  Chemotherapy      Patient is on Antibody Plan: BREAST ADO-TRASTUZUMAB EMTANSINE ([place]) Q21D    Brain metastasis ([place])  07/13/2018 Initial Diagnosis   Brain metastasis ([place])   11/27/2020 - 02/05/2021 Chemotherapy      Patient is on Antibody Plan: BREAST ADO-TRASTUZUMAB EMTANSINE (KADCYLA) Q21D      CHIEF COMPLIANT: Follow-up of metastatic breast cancer  INTERVAL HISTORY: [name] Riley is a 70 y.o. with above-mentioned history of metastatic breast cancer currently on abemaciclib with letrozole along with Herceptin, Avastin,andXgeva. She presents to the clinic today for follow-up.  Her major complaint today is peripheral neuropathy in the hands and feet.  It comes on and off and she has dropped a few objects.  She does not have it at this time.  She had fallen recently at home and her her hips and that she had x-rays which showed a chronic pelvic fracture.  She also has a nonhealing wound on the right shin.  She has noticed bruising on her hands.  ALLERGIES:  is allergic to cantaloupe extract allergy skin test, contrast media [iodinated diagnostic agents], pravastatin, zosyn [piperacillin sod-tazobactam so], and latex.  MEDICATIONS:  Current Outpatient Medications  Medication Sig Dispense Refill  . cephALEXin (KEFLEX) 500 MG capsule Take 1 capsule (500 mg total) by mouth 3 (three) times daily. 21 capsule 0  . abemaciclib (VERZENIO) 100 MG tablet TAKE 1 TABLET BY MOUTH 2 TIMES DAILY. SWALLOW TABLETS WHOLE. DO NOT CHEW, CRUSH, OR SPLIT TABLETS BEFORE SWALLOWING. 56 tablet 3  . acetaminophen (TYLENOL) 500 MG tablet Take 1,000 mg by mouth every 6 (six) hours as needed for moderate pain.     . Ascorbic Acid (VITAMIN C PO) Take by mouth.    . cholecalciferol (VITAMIN D) 1000 units tablet Take 1 tablet (1,000 Units total) by mouth daily.    Mariane Baumgarten Sodium (DSS) 100 MG CAPS Take by mouth.    . furosemide (LASIX) 20 MG tablet TAKE ONE (1) TABLET BY MOUTH EVERY DAY 30 tablet 1  . letrozole (FEMARA) 2.5 MG tablet TAKE ONE (1) TABLET EACH DAY 90 tablet 3  . levETIRAcetam (KEPPRA) 500 MG tablet Take 1 tablet (500 mg total) by mouth 2 (two) times daily. 60 tablet 3  . levothyroxine (SYNTHROID) 100 MCG tablet TAKE ONE TABLET EACH MORNING BEFORE BREAKFAST 90 tablet 2  . lidocaine-prilocaine (EMLA) cream APPLY TOPICALLY AS NEEDED FOR PORT ACCESS 30 g 3  . metoprolol succinate (TOPROL-XL) 100 MG 24 hr tablet TAKE ONE (1) TABLET EACH DAY 90 tablet 1  . Multiple Vitamins-Minerals (MULTIVITAMIN WITH MINERALS) tablet Take 1 tablet by mouth daily.    . ondansetron (ZOFRAN-ODT) 4 MG disintegrating tablet TAKE 1 TABLET EVERY 6 HOURS AS NEEDED FOR NAUSEA AND VOMITING 30 tablet 3  . potassium chloride SA (KLOR-CON) 20 MEQ tablet Take 1 tablet (20 mEq  total) by mouth daily for 360 doses. 30 tablet 11  . rosuvastatin (CRESTOR) 5 MG tablet Take 1 tablet (5 mg total) by mouth daily. (new directions) 90 tablet 1  . triamcinolone (KENALOG) 0.025 % ointment Apply 1 application topically 2 (two) times daily. 80 g 1  . valsartan (DIOVAN) 80 MG tablet TAKE ONE (1) TABLET EACH DAY 90 tablet 1   No current facility-administered medications for this visit.   Facility-Administered Medications Ordered in Other Visits  Medication Dose Route Frequency Provider Last Rate Last Admin  . ado-trastuzumab emtansine (KADCYLA) 260 mg in sodium chloride 0.9 % 250 mL chemo infusion  3.4 mg/kg (Treatment Plan Recorded) Intravenous Once Nicholas Lose, MD 526 mL/hr at 03/19/21 1130 260 mg at 03/19/21 1130  . heparin lock flush 100 unit/mL  500 Units Intracatheter Once PRN Nicholas Lose, MD      . sodium chloride flush (NS) 0.9  % injection 10 mL  10 mL Intracatheter PRN Nicholas Lose, MD        PHYSICAL EXAMINATION: ECOG PERFORMANCE STATUS: 1 - Symptomatic but completely ambulatory  Vitals:   03/19/21 1001  BP: 138/73  Pulse: 91  Resp: 18  Temp: 97.7 F (36.5 C)  SpO2: 98%   Filed Weights   03/19/21 1001  Weight: 163 lb 4.8 oz (74.1 kg)    LABORATORY DATA:  I have reviewed the data as listed CMP Latest Ref Rng & Units 03/19/2021 02/26/2021 02/09/2021  Glucose 70 - 99 mg/dL 111(H) 89 149(H)  BUN 8 - 23 mg/dL _0 Creatinine 0.44 - 1.00 mg/dL 0.84 0.80 1.08(H)  Sodium 135 - 145 mmol/L 140 137 138  Potassium 3.5 - 5.1 mmol/L 3.2(L) 3.6 3.5  Chloride 98 - 111 mmol/L 101 101 98  CO2 22 - 32 mmol/L _1 Calcium 8.9 - 10.3 mg/dL 10.1 9.3 9.4  Total Protein 6.5 - 8.1 g/dL 6.4(L) 6.3(L) 5.8(L)  Total Bilirubin 0.3 - 1.2 mg/dL 0.9 0.9 0.7  Alkaline Phos 38 - 126 U/L 217(H) 220(H) 246(H)  AST 15 - 41 U/L 48(H) 67(H) 91(H)  ALT 0 - 44 U/L 22 59(H) 68(H)    Lab Results  Component Value Date   WBC 3.6 (L) 03/19/2021   HGB 10.9 (L) 03/19/2021   HCT 31.9 (L) 03/19/2021   MCV 109.2 (H) 03/19/2021   PLT 115 (L) 03/19/2021   NEUTROABS 1.3 (L) 03/19/2021    ASSESSMENT & PLAN:  Metastatic breast cancer ([place]) Current treatment: Abemaciclibwith letrozole along with Herceptin(to be given every 4 weeks)and Xgeva started 06/15/2018  CT CAP 05/25/2020: No significant interval change in the bone metastases. 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 Mickeal Skinner is adding Avastin to the treatment.Today is cycle 1  CTCAP2/07/2021: New hepatic lesions worrisome for hepatic mets (1.6 cm, 1.2 cm, 2.3 cm)  Recommendation: 1. Biopsy of the liver lesion.12/11/2020: Metastatic carcinoma, ER 75% week to moderate, PR 0%, Ki-67 25%,  HER-2 equivocal by IHC, FISH positive ratio 2.97, copy #5.2 2. Change treatment plan:Kadcylaalong withabemaciclib and letrozole --------------------------------------------------------------------------------------------------------------------- Current Treatment: Kadcyla q [redacted] weeks along with Verzenio. Today is cycle 4 (she completed bevacizumab x4)  Kadcyla toxicities:  1.  Fatigue as a result of chemotherapy: Stable 2. chemo induced anemia: Monitoring closely 3. peripheral neuropathy: Patient has been explained treatment and neuropathy in the hands and  feet.  Sometimes she has to drop objects.  It is not all the time.  Recent fall and x-rays revealed a chronic fracture in the pelvis. Hoarseness of voice: Unclear etiology.  We will see what the scans show. Nonhealing wound on the right leg: I sent a prescription for Keflex. Easy bruising on the hands: Could be the effect of prior Avastin therapy  Abemaciclib toxicities: She had 1 episode of diarrhea which was controlled with Imodium.  Scans will be done prior to next treatment.  Orders Placed This Encounter  Procedures  . CT CHEST ABDOMEN PELVIS WO CONTRAST    Please extend upto base of neck (hoarseness of voice)    Standing Status:   Future    Standing Expiration Date:   03/19/2022    Order Specific Question:   If indicated for the ordered procedure, I authorize the administration of contrast media per Radiology protocol    Answer:   Yes    Order Specific Question:   Preferred imaging location?    Answer:   [facility]    Order Specific Question:   Release to patient    Answer:   Immediate    Order Specific Question:   Is Oral Contrast requested for this exam?    Answer:   Yes, Per Radiology protocol    Order Specific Question:   Reason for Exam (SYMPTOM  OR DIAGNOSIS REQUIRED)    Answer:   Met breast cancer restaging   The patient has a good understanding of the overall plan. she agrees with it. she will call with  any problems that may develop before the next visit here.  Total time spent: 30 mins including face to face time and time spent for planning, charting and coordination of care  Rulon Eisenmenger, MD, MPH 03/19/2021  I, Cloyde Reams Dorshimer, am acting as scribe for Dr. Nicholas Lose.  I have reviewed the above documentation for accuracy and completeness, and I agree with the above.

## 2021-03-19 ENCOUNTER — Other Ambulatory Visit: Payer: Self-pay

## 2021-03-19 ENCOUNTER — Inpatient Hospital Stay: Payer: Medicare Other

## 2021-03-19 ENCOUNTER — Inpatient Hospital Stay: Payer: Medicare Other | Attending: Hematology and Oncology | Admitting: Hematology and Oncology

## 2021-03-19 VITALS — BP 138/73 | HR 91 | Temp 97.7°F | Resp 18 | Ht 68.0 in | Wt 163.3 lb

## 2021-03-19 DIAGNOSIS — Z170 Estrogen receptor positive status [ER+]: Secondary | ICD-10-CM

## 2021-03-19 DIAGNOSIS — R5383 Other fatigue: Secondary | ICD-10-CM | POA: Diagnosis not present

## 2021-03-19 DIAGNOSIS — C7951 Secondary malignant neoplasm of bone: Secondary | ICD-10-CM

## 2021-03-19 DIAGNOSIS — C50412 Malignant neoplasm of upper-outer quadrant of left female breast: Secondary | ICD-10-CM

## 2021-03-19 DIAGNOSIS — D6481 Anemia due to antineoplastic chemotherapy: Secondary | ICD-10-CM | POA: Diagnosis not present

## 2021-03-19 DIAGNOSIS — Z9011 Acquired absence of right breast and nipple: Secondary | ICD-10-CM | POA: Diagnosis not present

## 2021-03-19 DIAGNOSIS — E876 Hypokalemia: Secondary | ICD-10-CM | POA: Insufficient documentation

## 2021-03-19 DIAGNOSIS — C50919 Malignant neoplasm of unspecified site of unspecified female breast: Secondary | ICD-10-CM

## 2021-03-19 DIAGNOSIS — D72819 Decreased white blood cell count, unspecified: Secondary | ICD-10-CM | POA: Diagnosis not present

## 2021-03-19 DIAGNOSIS — Z5112 Encounter for antineoplastic immunotherapy: Secondary | ICD-10-CM | POA: Insufficient documentation

## 2021-03-19 DIAGNOSIS — Z79899 Other long term (current) drug therapy: Secondary | ICD-10-CM | POA: Diagnosis not present

## 2021-03-19 DIAGNOSIS — Z9221 Personal history of antineoplastic chemotherapy: Secondary | ICD-10-CM | POA: Diagnosis not present

## 2021-03-19 DIAGNOSIS — G629 Polyneuropathy, unspecified: Secondary | ICD-10-CM | POA: Diagnosis not present

## 2021-03-19 DIAGNOSIS — T451X5A Adverse effect of antineoplastic and immunosuppressive drugs, initial encounter: Secondary | ICD-10-CM | POA: Insufficient documentation

## 2021-03-19 DIAGNOSIS — C7931 Secondary malignant neoplasm of brain: Secondary | ICD-10-CM | POA: Insufficient documentation

## 2021-03-19 DIAGNOSIS — L989 Disorder of the skin and subcutaneous tissue, unspecified: Secondary | ICD-10-CM | POA: Diagnosis not present

## 2021-03-19 DIAGNOSIS — C50912 Malignant neoplasm of unspecified site of left female breast: Secondary | ICD-10-CM

## 2021-03-19 LAB — CBC WITH DIFFERENTIAL (CANCER CENTER ONLY)
Abs Immature Granulocytes: 0.02 10*3/uL (ref 0.00–0.07)
Basophils Absolute: 0 10*3/uL (ref 0.0–0.1)
Basophils Relative: 1 %
Eosinophils Absolute: 0.1 10*3/uL (ref 0.0–0.5)
Eosinophils Relative: 1 %
HCT: 31.9 % — ABNORMAL LOW (ref 36.0–46.0)
Hemoglobin: 10.9 g/dL — ABNORMAL LOW (ref 12.0–15.0)
Immature Granulocytes: 1 %
Lymphocytes Relative: 52 %
Lymphs Abs: 1.9 10*3/uL (ref 0.7–4.0)
MCH: 37.3 pg — ABNORMAL HIGH (ref 26.0–34.0)
MCHC: 34.2 g/dL (ref 30.0–36.0)
MCV: 109.2 fL — ABNORMAL HIGH (ref 80.0–100.0)
Monocytes Absolute: 0.4 10*3/uL (ref 0.1–1.0)
Monocytes Relative: 10 %
Neutro Abs: 1.3 10*3/uL — ABNORMAL LOW (ref 1.7–7.7)
Neutrophils Relative %: 35 %
Platelet Count: 115 10*3/uL — ABNORMAL LOW (ref 150–400)
RBC: 2.92 MIL/uL — ABNORMAL LOW (ref 3.87–5.11)
RDW: 15.1 % (ref 11.5–15.5)
WBC Count: 3.6 10*3/uL — ABNORMAL LOW (ref 4.0–10.5)
nRBC: 0 % (ref 0.0–0.2)

## 2021-03-19 LAB — CMP (CANCER CENTER ONLY)
ALT: 22 U/L (ref 0–44)
AST: 48 U/L — ABNORMAL HIGH (ref 15–41)
Albumin: 2.9 g/dL — ABNORMAL LOW (ref 3.5–5.0)
Alkaline Phosphatase: 217 U/L — ABNORMAL HIGH (ref 38–126)
Anion gap: 11 (ref 5–15)
BUN: 12 mg/dL (ref 8–23)
CO2: 28 mmol/L (ref 22–32)
Calcium: 10.1 mg/dL (ref 8.9–10.3)
Chloride: 101 mmol/L (ref 98–111)
Creatinine: 0.84 mg/dL (ref 0.44–1.00)
GFR, Estimated: >60 mL/min (ref >60)
Glucose, Bld: 111 mg/dL — ABNORMAL HIGH (ref 70–99)
Potassium: 3.2 mmol/L — ABNORMAL LOW (ref 3.5–5.1)
Sodium: 140 mmol/L (ref 135–145)
Total Bilirubin: 0.9 mg/dL (ref 0.3–1.2)
Total Protein: 6.4 g/dL — ABNORMAL LOW (ref 6.5–8.1)

## 2021-03-19 MED ORDER — DIPHENHYDRAMINE HCL 25 MG PO CAPS
ORAL_CAPSULE | ORAL | Status: AC
  Filled 2021-03-19: qty 2

## 2021-03-19 MED ORDER — ACETAMINOPHEN 325 MG PO TABS
ORAL_TABLET | ORAL | Status: AC
  Filled 2021-03-19: qty 2

## 2021-03-19 MED ORDER — SODIUM CHLORIDE 0.9 % IV SOLN
Freq: Once | INTRAVENOUS | Status: AC
  Filled 2021-03-19: qty 250

## 2021-03-19 MED ORDER — HEPARIN SOD (PORK) LOCK FLUSH 100 UNIT/ML IV SOLN
500.0000 [IU] | Freq: Once | INTRAVENOUS | Status: AC | PRN
  Administered 2021-03-19: 500 [IU]
  Filled 2021-03-19: qty 5

## 2021-03-19 MED ORDER — CEPHALEXIN 500 MG PO CAPS: 500.0000 mg | ORAL_CAPSULE | Freq: Three times a day (TID) | ORAL | 0 refills | Status: DC

## 2021-03-19 MED ORDER — DIPHENHYDRAMINE HCL 25 MG PO CAPS
ORAL_CAPSULE | ORAL | Status: AC
  Filled 2021-03-19: qty 1

## 2021-03-19 MED ORDER — DENOSUMAB 120 MG/1.7ML ~~LOC~~ SOLN
120.0000 mg | Freq: Once | SUBCUTANEOUS | Status: AC
Start: 2021-03-19 — End: 2021-03-19
  Administered 2021-03-19: 120 mg via SUBCUTANEOUS

## 2021-03-19 MED ORDER — DENOSUMAB 120 MG/1.7ML ~~LOC~~ SOLN
SUBCUTANEOUS | Status: AC
  Filled 2021-03-19: qty 1.7

## 2021-03-19 MED ORDER — ACETAMINOPHEN 325 MG PO TABS
650.0000 mg | ORAL_TABLET | Freq: Once | ORAL | Status: AC
  Administered 2021-03-19: 650 mg via ORAL

## 2021-03-19 MED ORDER — SODIUM CHLORIDE 0.9% FLUSH
10.0000 mL | INTRAVENOUS | Status: DC | PRN
  Administered 2021-03-19: 10 mL
  Filled 2021-03-19: qty 10

## 2021-03-19 MED ORDER — DIPHENHYDRAMINE HCL 25 MG PO CAPS
50.0000 mg | ORAL_CAPSULE | Freq: Once | ORAL | Status: AC
Start: 2021-03-19 — End: 2021-03-19
  Administered 2021-03-19: 50 mg via ORAL

## 2021-03-19 MED ORDER — SODIUM CHLORIDE 0.9 % IV SOLN
3.4000 mg/kg | Freq: Once | INTRAVENOUS | Status: AC
  Administered 2021-03-19: 260 mg via INTRAVENOUS
  Filled 2021-03-19: qty 8

## 2021-03-19 NOTE — Progress Notes (Signed)
Per Dr. Lindi Adie, ok to treat with ANC 1.3. Also, ok to use echo results from 11/2020.

## 2021-03-19 NOTE — Patient Instructions (Signed)
[facility] MEDICAL [facility]COLOGY   °Discharge Instructions: °Thank you for choosing [facility] to provide your oncology and hematology care.  ° °If you have a lab appointment with the Cancer Center, please go directly to the Cancer Center and check in at the registration area. °  °Wear comfortable clothing and clothing appropriate for easy access to any Portacath or PICC line.  ° °We strive to give you quality time with your provider. You may need to reschedule your appointment if you arrive late (15 or more minutes).  Arriving late affects you and other patients whose appointments are after yours.  Also, if you miss three or more appointments without notifying the office, you may be dismissed from the clinic at the provider’s discretion.    °  °For prescription refill requests, have your pharmacy contact our office and allow 72 hours for refills to be completed.   ° °Today you received the following chemotherapy and/or immunotherapy agents: ado-trastuzumab emtansine    °  °To help prevent nausea and vomiting after your treatment, we encourage you to take your nausea medication as directed. ° °BELOW ARE SYMPTOMS THAT SHOULD BE REPORTED IMMEDIATELY: °*FEVER GREATER THAN 100.4 F (38 °C) OR HIGHER °*CHILLS OR SWEATING °*NAUSEA AND VOMITING THAT IS NOT CONTROLLED WITH YOUR NAUSEA MEDICATION °*UNUSUAL SHORTNESS OF BREATH °*UNUSUAL BRUISING OR BLEEDING °*URINARY PROBLEMS (pain or burning when urinating, or frequent urination) °*BOWEL PROBLEMS (unusual diarrhea, constipation, pain near the anus) °TENDERNESS IN MOUTH AND THROAT WITH OR WITHOUT PRESENCE OF ULCERS (sore throat, sores in mouth, or a toothache) °UNUSUAL RASH, SWELLING OR PAIN  °UNUSUAL VAGINAL DISCHARGE OR ITCHING  ° °Items with * indicate a potential emergency and should be followed up as soon as possible or go to the Emergency Department if any problems should occur. ° °Please show the CHEMOTHERAPY ALERT CARD or IMMUNOTHERAPY ALERT  CARD at check-in to the Emergency Department and triage nurse. ° °Should you have questions after your visit or need to cancel or reschedule your appointment, please contact [facility]  Dept: [phone]  and follow the prompts.  Office hours are 8:00 a.m. to 4:30 p.m. Monday - Friday. Please note that voicemails left after 4:00 p.m. may not be returned until the following business day.  We are closed weekends and major holidays. You have access to a nurse at all times for urgent questions. Please call the main number to the clinic Dept: [phone] and follow the prompts. ° ° °For any non-urgent questions, you may also contact your provider using MyChart. We now offer e-Visits for anyone 18 and older to request care online for non-urgent symptoms. For details visit [email or web address]. °  °Also download the MyChart app! Go to the app store, search "MyChart", open the app, select [facility], and log in with your MyChart username and password. ° °Due to Covid, a mask is required upon entering the hospital/clinic. If you do not have a mask, one will be given to you upon arrival. For doctor visits, patients may have 1 support person aged 18 or older with them. For treatment visits, patients cannot have anyone with them due to current Covid guidelines and our immunocompromised population.  ° °

## 2021-03-19 NOTE — Assessment & Plan Note (Signed)
Current treatment: Abemaciclibwith letrozole along with Herceptin(to be given every 4 weeks)and Xgeva started 06/15/2018  CT CAP 05/25/2020: No significant interval change in the bone metastases. 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 Mickeal Skinner is adding Avastin to the treatment.Today is cycle 1  CTCAP2/07/2021: New hepatic lesions worrisome for hepatic mets (1.6 cm, 1.2 cm, 2.3 cm)  Recommendation: 1. Biopsy of the liver lesion.12/11/2020: Metastatic carcinoma, ER 75% week to moderate, PR 0%, Ki-67 25%, HER-2 equivocal by IHC, FISH positive ratio 2.97, copy #5.2 2. Change treatment plan:Kadcylaalong withabemaciclib and letrozole --------------------------------------------------------------------------------------------------------------------- Current Treatment: Kadcyla q 3 weeks. Today is cycle 4 (she completed bevacizumab x4) Kadcyla toxicities:  1.  Fatigue as a result of chemotherapy: Stable 2. chemo induced anemia: Monitoring closely Denies peripheral neuropathy  Abemaciclib toxicities: Denies any diarrhea  Scans will be done prior to next treatment.

## 2021-03-19 NOTE — Addendum Note (Signed)
Addended by: Sinda Du on: 03/19/2021 12:35 PM   Modules accepted: Orders

## 2021-03-19 NOTE — Progress Notes (Signed)
Patient requesting to d/c oral potassium supplement. Dr. Lindi Adie aware of today's serum potassium results. Dr. Lindi Adie advised to have patient stop taking oral potassium replacement. Patient aware and verbalizes understanding.

## 2021-03-29 ENCOUNTER — Encounter (HOSPITAL_COMMUNITY): Payer: Self-pay | Admitting: Emergency Medicine

## 2021-03-29 ENCOUNTER — Other Ambulatory Visit: Payer: Self-pay

## 2021-03-29 ENCOUNTER — Other Ambulatory Visit: Payer: Self-pay | Admitting: *Deleted

## 2021-03-29 ENCOUNTER — Emergency Department (HOSPITAL_COMMUNITY)
Admission: EM | Admit: 2021-03-29 | Discharge: 2021-03-30 | Disposition: A | Discharge status: Home / Self Care | Payer: Medicare Other | Attending: Emergency Medicine | Admitting: Emergency Medicine

## 2021-03-29 DIAGNOSIS — Z91040 Latex allergy status: Secondary | ICD-10-CM | POA: Insufficient documentation

## 2021-03-29 DIAGNOSIS — Z79899 Other long term (current) drug therapy: Secondary | ICD-10-CM | POA: Insufficient documentation

## 2021-03-29 DIAGNOSIS — I10 Essential (primary) hypertension: Secondary | ICD-10-CM | POA: Diagnosis not present

## 2021-03-29 DIAGNOSIS — E876 Hypokalemia: Secondary | ICD-10-CM | POA: Insufficient documentation

## 2021-03-29 DIAGNOSIS — Z853 Personal history of malignant neoplasm of breast: Secondary | ICD-10-CM | POA: Diagnosis not present

## 2021-03-29 DIAGNOSIS — R000 Tachycardia, unspecified: Secondary | ICD-10-CM | POA: Insufficient documentation

## 2021-03-29 DIAGNOSIS — X58XXXA Exposure to other specified factors, initial encounter: Secondary | ICD-10-CM | POA: Diagnosis not present

## 2021-03-29 DIAGNOSIS — E039 Hypothyroidism, unspecified: Secondary | ICD-10-CM | POA: Diagnosis not present

## 2021-03-29 DIAGNOSIS — I2510 Atherosclerotic heart disease of native coronary artery without angina pectoris: Secondary | ICD-10-CM | POA: Insufficient documentation

## 2021-03-29 DIAGNOSIS — S81801A Unspecified open wound, right lower leg, initial encounter: Secondary | ICD-10-CM | POA: Diagnosis not present

## 2021-03-29 LAB — CBC WITH DIFFERENTIAL/PLATELET
Abs Immature Granulocytes: 0.01 10*3/uL (ref 0.00–0.07)
Basophils Absolute: 0 10*3/uL (ref 0.0–0.1)
Basophils Relative: 1 %
Eosinophils Absolute: 0 10*3/uL (ref 0.0–0.5)
Eosinophils Relative: 1 %
HCT: 33 % — ABNORMAL LOW (ref 36.0–46.0)
Hemoglobin: 11 g/dL — ABNORMAL LOW (ref 12.0–15.0)
Immature Granulocytes: 0 %
Lymphocytes Relative: 55 %
Lymphs Abs: 1.7 10*3/uL (ref 0.7–4.0)
MCH: 37.3 pg — ABNORMAL HIGH (ref 26.0–34.0)
MCHC: 33.3 g/dL (ref 30.0–36.0)
MCV: 111.9 fL — ABNORMAL HIGH (ref 80.0–100.0)
Monocytes Absolute: 0.4 10*3/uL (ref 0.1–1.0)
Monocytes Relative: 12 %
Neutro Abs: 1 10*3/uL — ABNORMAL LOW (ref 1.7–7.7)
Neutrophils Relative %: 31 %
Platelets: 109 10*3/uL — ABNORMAL LOW (ref 150–400)
RBC: 2.95 MIL/uL — ABNORMAL LOW (ref 3.87–5.11)
RDW: 15.7 % — ABNORMAL HIGH (ref 11.5–15.5)
WBC: 3.2 10*3/uL — ABNORMAL LOW (ref 4.0–10.5)
nRBC: 0 % (ref 0.0–0.2)

## 2021-03-29 LAB — BASIC METABOLIC PANEL
Anion gap: 10 (ref 5–15)
BUN: 11 mg/dL (ref 8–23)
CO2: 28 mmol/L (ref 22–32)
Calcium: 9 mg/dL (ref 8.9–10.3)
Chloride: 99 mmol/L (ref 98–111)
Creatinine, Ser: 0.73 mg/dL (ref 0.44–1.00)
GFR, Estimated: >60 mL/min (ref >60)
Glucose, Bld: 101 mg/dL — ABNORMAL HIGH (ref 70–99)
Potassium: 2.8 mmol/L — ABNORMAL LOW (ref 3.5–5.1)
Sodium: 137 mmol/L (ref 135–145)

## 2021-03-29 LAB — LACTIC ACID, PLASMA: Lactic Acid, Venous: 1.4 mmol/L (ref 0.5–1.9)

## 2021-03-29 MED ORDER — POTASSIUM CHLORIDE CRYS ER 20 MEQ PO TBCR
40.0000 meq | EXTENDED_RELEASE_TABLET | Freq: Once | ORAL | Status: AC
  Administered 2021-03-29: 40 meq via ORAL
  Filled 2021-03-29: qty 2

## 2021-03-29 MED ORDER — SODIUM CHLORIDE 0.9 % IV BOLUS
500.0000 mL | Freq: Once | INTRAVENOUS | Status: AC
  Administered 2021-03-29: 500 mL via INTRAVENOUS

## 2021-03-29 MED ORDER — PREDNISONE 50 MG PO TABS: ORAL_TABLET | ORAL | 0 refills | Status: DC

## 2021-03-29 MED ORDER — POTASSIUM CHLORIDE CRYS ER 20 MEQ PO TBCR: 20.0000 meq | EXTENDED_RELEASE_TABLET | Freq: Two times a day (BID) | ORAL | 0 refills | Status: DC

## 2021-03-29 NOTE — ED Triage Notes (Signed)
Pt c/o wound to right shin. Pt currently under treatment for cancer. Pt recently treated for similar wound. Pt also c/o anxiety and just not feeling good today.

## 2021-03-29 NOTE — Progress Notes (Signed)
Received call from pt requesting prescription for prednisone prep prior to CT scan.  Per MD pt to take prednisone 50 mg p.o 13 hours prior to scan, 7 hours prior to scan and 1 hour prior to scan.  Pt to also take benadryl 50 mg p.o 1 hour prior to scan.  Prescription sent to pharmacy on file, pt educated and verbalized understanding.

## 2021-03-29 NOTE — ED Notes (Signed)
Pt very anxious during triage.

## 2021-03-29 NOTE — ED Provider Notes (Signed)
[facility] EMERGENCY DEPARTMENT Provider Note   CSN: [id number] Arrival date & time: 03/29/21  2102     History Chief Complaint  Patient presents with   Leg Wound    [name] Riley is a 70 y.o. female.  HPI  Patient has a history of metastatic breast cancer that has metastasized to the bones as well as the brain.  Patient is currently undergoing chemotherapy and is followed by Dr. Lindi Adie and Dr. Mickeal Skinner.  Patient states she has noticed a scab on her lower leg that has been there for couple weeks.  Patient has shown this to her oncologist.  She was started on antibiotics recently.  Patient felt more anxious today and had some general malaise.  She thought that the wound might be getting slightly larger.  Patient was concerned so she decided to come to the ED.  She has not had any fevers or chills.  No vomiting or diarrhea.  She does feel somewhat anxious and tremulous.  She does have numbness in her extremities but that has been attributed to her chemotherapy agents  Past Medical History:  Diagnosis Date   Allergy    Breast cancer ([place]) 08/25/2011   L , invasive ductal carcinoma, ER/PR +,HER2 -   Cancer ([place])    left breast cancer   Coronary artery disease 2001   Heart attack [facility]) 09/2000   Sep 25, 2000  --no intervention   History of chemotherapy comp. 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   Brain metastasis ([place]) 07/13/2018   Goals of care, counseling/discussion 06/05/2018   UTI (urinary tract infection) 05/31/2018   Sepsis secondary to UTI ([place]) 05/31/2018   Thrombocytopenia ([place]) 05/31/2018    Hypokalemia 05/31/2018   Port-A-Cath in place 03/23/2018   Elevated LFTs    Metastatic breast cancer ([place]) 06/29/2017   Nodule of skin of breast 03/06/2017   Abscess, abdomen 07/06/2016   Metastasis to bone ([place]) 06/01/2016   Pneumothorax on left 04/25/2016   Chest pain 10/23/2014   Radiation-induced dermatitis 07/27/2012   Hypertension    Breast cancer of upper-outer quadrant of left female breast ([place]) 09/01/2011   Hypothyroidism 06/12/2009   Mixed hyperlipidemia 06/12/2009   Essential hypertension 06/12/2009   Coronary atherosclerosis 06/12/2009   DIZZINESS 06/12/2009   PALPITATIONS 06/12/2009    Past Surgical History:  Procedure Laterality Date   ABDOMINAL HYSTERECTOMY  1998   TAH, oophorectomy   APPENDECTOMY  1970   BREAST CYST EXCISION Left 07/29/2020   Procedure: WIDE EXCISION OF LEFT MASTECTOMY INCISION;  Surgeon: Donnie Mesa, MD;  Location: [place];  Service: General;  Laterality: Left;   [place]   removal of benign lump in rt breast   BREAST SURGERY  11/14/11   right simple mastectomy, left mrm   INCISION AND DRAINAGE OF WOUND Left 07/01/2017   Procedure: IRRIGATION AND DEBRIDEMENT CHEST WALL ABSCESS;  Surgeon: Donnie Mesa, MD;  Location: [facility];  Service: General;  Laterality: Left;   MASS EXCISION Left 06/22/2017   Procedure: EXCISION OF CHEST  WALL MASSES;  Surgeon: Donnie Mesa, MD;  Location: [place];  Service: General;  Laterality: Left;   MASTECTOMY Bilateral    for left breast cancer   OVARIAN CYST SURGERY Right 1970   PORT-A-CATH REMOVAL  08/30/2012   Procedure: REMOVAL PORT-A-CATH;  Surgeon: Imogene Burn. [name], MD;  Location: [place];  Service: General;  Laterality: Right;  port removal   PORTACATH PLACEMENT  11/14/2011   Procedure: INSERTION PORT-A-CATH;  Surgeon: Imogene Burn. [name], MD;  Location: [place];  Service: General;  Laterality: Right;   PORTACATH PLACEMENT N/A 04/25/2016   Procedure: INSERTION  PORT-A-CATH LEFT CHEST;  Surgeon: Donnie Mesa, MD;  Location: [place];  Service: General;  Laterality: N/A;   skin tags  05/09/1997   left axillary left neck skin tags   TONSILLECTOMY  1968     OB History     Gravida  4   Para      Term      Preterm      AB  1   Living  3      SAB  1   IAB      Ectopic      Multiple      Live Births              Family History  Problem Relation Age of Onset   Hypertension Maternal Grandmother    Diabetes Maternal Grandmother    Cancer Father 78       lung cancer and Prostate Cancer   Hypertension Mother    Cancer Paternal Aunt        ovarian   Cancer Cousin        breast, paternal cousin   Cancer Paternal Uncle        stomach   Cancer Paternal Grandfather        Esophagus   Colon cancer Neg Hx     Social History   Tobacco Use   Smoking status: Never   Smokeless tobacco: Never  Vaping Use   Vaping Use: Never used  Substance Use Topics   Alcohol use: No   Drug use: No    Home Medications Prior to Admission medications   Medication Sig Start Date End Date Taking? Authorizing Provider  potassium chloride SA (KLOR-CON) 20 MEQ tablet Take 1 tablet (20 mEq total) by mouth 2 (two) times daily for 7 days. 03/29/21 04/05/21 Yes Dorie Rank, MD  abemaciclib (VERZENIO) 100 MG tablet TAKE 1 TABLET BY MOUTH 2 TIMES DAILY. SWALLOW TABLETS WHOLE. DO NOT CHEW, CRUSH, OR SPLIT TABLETS BEFORE SWALLOWING. 03/16/21 03/16/22  Nicholas Lose, MD  acetaminophen (TYLENOL) 500 MG tablet Take 1,000 mg by mouth every 6 (six) hours as needed for moderate pain.    [provider]  Ascorbic Acid (VITAMIN C PO) Take by mouth.    [provider]  cephALEXin (KEFLEX) 500 MG capsule Take 1 capsule (500 mg total) by mouth 3 (three) times daily. 03/19/21   Nicholas Lose, MD  cholecalciferol (VITAMIN D) 1000 units tablet Take 1 tablet (1,000 Units total) by mouth daily. 04/13/18   Nicholas Lose, MD  Docusate Sodium  (DSS) 100 MG CAPS Take by mouth.    [provider]  furosemide (LASIX) 20 MG tablet TAKE ONE (1) TABLET BY MOUTH EVERY DAY 02/08/21   Nicholas Lose, MD  letrozole [facility]) 2.5 MG tablet TAKE ONE (1) TABLET EACH DAY 05/04/20   Nicholas Lose, MD  levETIRAcetam (KEPPRA) 500 MG tablet  Take 1 tablet (500 mg total) by mouth 2 (two) times daily. 11/09/20   Ventura Sellers, MD  levothyroxine (SYNTHROID) 100 MCG tablet TAKE ONE TABLET EACH MORNING BEFORE BREAKFAST 11/12/20   [name], MD  lidocaine-prilocaine (EMLA) cream APPLY TOPICALLY AS NEEDED FOR PORT ACCESS 01/15/21   Harle Stanford., PA-C  metoprolol succinate (TOPROL-XL) 100 MG 24 hr tablet TAKE ONE (1) TABLET EACH DAY 02/09/21   [name], MD  Multiple Vitamins-Minerals (MULTIVITAMIN WITH MINERALS) tablet Take 1 tablet by mouth daily.    [provider]  ondansetron (ZOFRAN-ODT) 4 MG disintegrating tablet TAKE 1 TABLET EVERY 6 HOURS AS NEEDED FOR NAUSEA AND VOMITING 11/21/19   Nicholas Lose, MD  predniSONE (DELTASONE) 50 MG tablet Patient to take 13 hours, 7 hours, and 1 hour prior to CT scan 03/29/21   Nicholas Lose, MD  rosuvastatin (CRESTOR) 5 MG tablet Take 1 tablet (5 mg total) by mouth daily. (new directions) 02/10/21   [name], MD  triamcinolone (KENALOG) 0.025 % ointment Apply 1 application topically 2 (two) times daily. 08/07/20   Nicholas Lose, MD  valsartan (DIOVAN) 80 MG tablet TAKE ONE (1) TABLET EACH DAY 02/09/21   [name], MD    Allergies    Cantaloupe extract allergy skin test, Contrast media [iodinated diagnostic agents], Pravastatin, Zosyn [piperacillin sod-tazobactam so], and Latex  Review of Systems   Review of Systems  All other systems reviewed and are negative.  Physical Exam Updated Vital Signs BP 134/66   Pulse 97   Temp 98.3 F (36.8 C) (Oral)   Resp 18   Ht 1.727 m (5' 8" )   Wt 74.1 kg   SpO2 97%   BMI 24.83 kg/m   Physical Exam Vitals and nursing note reviewed.   Constitutional:      General: She is not in acute distress.    Appearance: She is well-developed.  HENT:     Head: Normocephalic and atraumatic.     Right Ear: External ear normal.     Left Ear: External ear normal.  Eyes:     General: No scleral icterus.       Right eye: No discharge.        Left eye: No discharge.     Conjunctiva/sclera: Conjunctivae normal.  Neck:     Trachea: No tracheal deviation.  Cardiovascular:     Rate and Rhythm: Regular rhythm. Tachycardia present.  Pulmonary:     Effort: Pulmonary effort is normal. No respiratory distress.     Breath sounds: Normal breath sounds. No stridor. No wheezing or rales.  Abdominal:     General: Bowel sounds are normal. There is no distension.     Palpations: Abdomen is soft.     Tenderness: There is no abdominal tenderness. There is no guarding or rebound.  Musculoskeletal:        General: No tenderness or deformity.     Cervical back: Neck supple.  Skin:    General: Skin is warm and dry.     Findings: No rash.     Comments: Oval nickel sized raised scab lesion on the right anterior shin, no lymphangitic streaking, no purulent drainage, no increased warmth  Neurological:     General: No focal deficit present.     Mental Status: She is alert.     Cranial Nerves: No cranial nerve deficit (no facial droop, extraocular movements intact, no slurred speech).     Sensory: No sensory deficit.     Motor: No  abnormal muscle tone or seizure activity.     Coordination: Coordination normal.  Psychiatric:        Mood and Affect: Mood normal.    ED Results / Procedures / Treatments   Labs (all labs ordered are listed, but only abnormal results are displayed) Labs Reviewed  BASIC METABOLIC PANEL - Abnormal; Notable for the following components:      Result Value   Potassium 2.8 (*)    Glucose, Bld 101 (*)    All other components within normal limits  CBC WITH DIFFERENTIAL/PLATELET - Abnormal; Notable for the following  components:   WBC 3.2 (*)    RBC 2.95 (*)    Hemoglobin 11.0 (*)    HCT 33.0 (*)    MCV 111.9 (*)    MCH 37.3 (*)    RDW 15.7 (*)    Platelets 109 (*)    Neutro Abs 1.0 (*)    All other components within normal limits  LACTIC ACID, PLASMA    EKG None  Radiology No results found.  Procedures Procedures   Medications Ordered in ED Medications  potassium chloride SA (KLOR-CON) CR tablet 40 mEq (has no administration in time range)  sodium chloride 0.9 % bolus 500 mL (0 mLs Intravenous Stopped 03/29/21 2335)    ED Course  I have reviewed the triage vital signs and the nursing notes.  Pertinent labs & imaging results that were available during my care of the patient were reviewed by me and considered in my medical decision making (see chart for details).  Clinical Course as of 03/29/21 2352  Mon Mar 29, 2021  2337 White blood cell count not elevated.  Anemia is stable [JK]  2337 Lactic acid levels not elevated.  Electrolyte panel notable for hypokalemia [JK]  2352 Patient is feeling better.  Tachycardia has resolved [JK]    Clinical Course User Index [JK] Dorie Rank, MD   MDM Rules/Calculators/A&P                          Patient presented to the ED for evaluation of a leg wound.  Patient also has noted some tremulousness today.  In the ED the patient's wound does not look particularly infected.  She does not have any purulent drainage or lymphangitic streaking.  Patient's laboratory test did show stable white blood cell count.  Normal lactic acid level.  I doubt acute infection.  Potassium level was decreased.  Suspect this could have been causing some of her tremulousness.  Patient is not having any vomiting or diarrhea also I doubt that she will have any significant continuous losses.  Patient was given oral dose potassium.  I will also discharge her home with a prescription potassium.  Discussed having her follow-up to have her levels rechecked with her doctor. Final  Clinical Impression(s) / ED Diagnoses Final diagnoses:  Hypokalemia  Wound of right lower extremity, initial encounter    Rx / DC Orders ED Discharge Orders          Ordered    potassium chloride SA (KLOR-CON) 20 MEQ tablet  2 times daily        03/29/21 2351             Dorie Rank, MD 03/29/21 2354

## 2021-03-29 NOTE — Discharge Instructions (Addendum)
Take the potassium 20 mg twice daily for 7 days.  Follow-up with your doctor to have your potassium level rechecked

## 2021-04-02 ENCOUNTER — Encounter (HOSPITAL_COMMUNITY): Payer: Self-pay

## 2021-04-02 ENCOUNTER — Other Ambulatory Visit: Payer: Self-pay

## 2021-04-02 ENCOUNTER — Ambulatory Visit (HOSPITAL_COMMUNITY)
Admission: RE | Admit: 2021-04-02 | Discharge: 2021-04-02 | Disposition: A | Discharge status: Home / Self Care | Payer: Medicare Other | Source: Ambulatory Visit | Attending: Hematology and Oncology | Admitting: Hematology and Oncology

## 2021-04-02 DIAGNOSIS — C50912 Malignant neoplasm of unspecified site of left female breast: Secondary | ICD-10-CM | POA: Insufficient documentation

## 2021-04-02 DIAGNOSIS — C7931 Secondary malignant neoplasm of brain: Secondary | ICD-10-CM | POA: Diagnosis present

## 2021-04-02 DIAGNOSIS — C50919 Malignant neoplasm of unspecified site of unspecified female breast: Secondary | ICD-10-CM | POA: Diagnosis present

## 2021-04-02 DIAGNOSIS — Z170 Estrogen receptor positive status [ER+]: Secondary | ICD-10-CM

## 2021-04-02 DIAGNOSIS — C7951 Secondary malignant neoplasm of bone: Secondary | ICD-10-CM | POA: Diagnosis present

## 2021-04-02 DIAGNOSIS — C50412 Malignant neoplasm of upper-outer quadrant of left female breast: Secondary | ICD-10-CM | POA: Insufficient documentation

## 2021-04-07 NOTE — Progress Notes (Signed)
Patient Care Team: [name], MD as PCP - General (Family Medicine) Minus Breeding, MD as PCP - Cardiology (Cardiology) Nicholas Lose, MD as Consulting Physician (Hematology and Oncology)  DIAGNOSIS:    ICD-10-CM   1. Malignant neoplasm of upper-outer quadrant of left breast in female, estrogen receptor positive ([place])  C50.412    Z17.0       SUMMARY OF ONCOLOGIC HISTORY: Oncology History  Breast cancer of upper-outer quadrant of left female breast ([place])  11/14/2011 Surgery   Bilateral mastectomy, prophylactic on the right, left breast IDC 3/18 lymph nodes positive with extracapsular extension ER 89%, PR 81%, HER-2 negative, Ki-67 79% T2 N1 A. stage IIB    12/13/2011 - 06/28/2012 Chemotherapy   4 cycles of FEC followed by 4 cycles of Taxotere    07/17/2012 - 08/22/2012 Radiation Therapy   Adjuvant radiation therapy    08/22/2012 - 03/16/2016 Anti-estrogen oral therapy   Arimidex 1 mg daily    03/16/2016 Relapse/Recurrence   Subcutaneous nodule excision left chest: Infiltrating carcinoma breast primary, ER positive, PR negative    03/29/2016 Imaging   CT CAP and bone scan: Lytic lesions T8 vertebral, T1 posterior element, subcutaneous nodule left lateral chest wall, nonspecific lung nodules; Bone scan: Mets to kull, left humerus, left eighth rib, T7/T8, sternum, left acetabulum    04/28/2016 - 06/17/2017 Chemotherapy   Herceptin, lapatinib, Faslodex, Zometa every 4 weeks, lapatinib discontinued in September 2018 due to elevation of LFTs   06/22/2017 Relapse/Recurrence   Surgical excision:Soft tissue mass left lateral chest wall primary breast cancer, soft tissue mass left medial chest wall breast cancer, tumor is within the dermis extending to the subcutaneous adipose tissue and involves portions of skeletal muscle    06/22/2017 Cancer Staging   Staging form: Breast, AJCC 7th Edition - Pathologic stage from 06/22/2017: Stage IV (TX, NX, M1) - Signed by Nicholas Lose, MD on  12/27/2019    08/2017 - 02/16/2018 Chemotherapy   Faslodex with Herceptin and Perjeta along with Zometa every 4 weeks   03/02/2018 - 05/25/2018 Chemotherapy   Kadcyla   05/11/2018 Imaging   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 Associated vasogenic edema within the underlying right cerebral hemisphere without significant midline shift. Signal abnormality throughout the visualized bone marrow, compatible with osseous metastatic disease.    06/04/2018 Imaging   CT CAP: Right lower lobe lung nodule 7 mm (was 5 mm); multiple bone metastases throughout the spine and ribs sternum scapula and humerus, slightly increased lower thoracic mets, right renal lesion 2.5 cm (was 1.2 cm) right femur met increased from 2.1 cm to 2.9 cm    06/15/2018 -  Anti-estrogen oral therapy   Abemaciclib, Herceptin, letrozole, Xgeva    06/07/2019 Imaging   Progression of brain metastases,2 discrete dural-based lesions involving the posterior right frontal lobe. The more anterior lesion now measures 16.5 x 17.5 x 14 mm. The posterior lesion now measures 9.5 x 13 x 20 mm.  Vasogenic edema of the frontal and parietal lobes   06/21/2019 - 06/27/2019 Radiation Therapy   SRS to the brain   07/19/2020 Relapse/Recurrence   Left mastectomy scar nodule: Biopsy invasive carcinoma ER 60%, PR 0%, HER-2 negative by [facility]   07/29/2020 Surgery   Soft tissue mass left chest wall excision: No evidence of malignancy.   11/27/2020 - 02/05/2021 Chemotherapy      Patient is on Antibody Plan: BREAST ADO-TRASTUZUMAB EMTANSINE ([place]) Q21D     01/15/2021 -  Chemotherapy  Patient is on Antibody Plan: BREAST ADO-TRASTUZUMAB EMTANSINE (KADCYLA) Q21D     Metastatic breast cancer ([place])  06/29/2017 Initial Diagnosis   Metastatic breast cancer ([place])    06/15/2018 - 12/25/2020 Chemotherapy    Patient is on Treatment Plan: BRAIN GBM BEVACIZUMAB 14D X 6 CYCLES       01/15/2021 -  Chemotherapy      Patient is  on Antibody Plan: BREAST ADO-TRASTUZUMAB EMTANSINE ([place]) Q21D     Brain metastasis ([place])  07/13/2018 Initial Diagnosis   Brain metastasis ([place])    11/27/2020 - 02/05/2021 Chemotherapy      Patient is on Antibody Plan: BREAST ADO-TRASTUZUMAB EMTANSINE (KADCYLA) Q21D       CHIEF COMPLIANT: Cycle 5 Kadcyla  INTERVAL HISTORY: [name] Riley is a 70 y.o. with above-mentioned history of metastatic breast cancer currently on abemaciclib with letrozole along with Herceptin, Avastin, and Xgeva. She presents to the clinic today for cycle 5 of Kadcyla.  She will perform scans and is here to discuss the results.  She had a sore on her right leg which was scabbed up previously but now the scab has come off and it looks like a punched-out lesion on the shin.  She experiences pain and discomfort intermittently.  Does not have any fevers or chills.  She was previously on antibiotic and she has completed it.  Occasionally she gets throbbing discomfort.  ALLERGIES:  is allergic to cantaloupe extract allergy skin test, contrast media [iodinated diagnostic agents], pravastatin, zosyn [piperacillin sod-tazobactam so], and latex.  MEDICATIONS:  Current Outpatient Medications  Medication Sig Dispense Refill   abemaciclib (VERZENIO) 100 MG tablet TAKE 1 TABLET BY MOUTH 2 TIMES DAILY. SWALLOW TABLETS WHOLE. DO NOT CHEW, CRUSH, OR SPLIT TABLETS BEFORE SWALLOWING. 56 tablet 3   acetaminophen (TYLENOL) 500 MG tablet Take 1,000 mg by mouth every 6 (six) hours as needed for moderate pain.     Ascorbic Acid (VITAMIN C PO) Take by mouth.     cephALEXin (KEFLEX) 500 MG capsule Take 1 capsule (500 mg total) by mouth 3 (three) times daily. 21 capsule 0   cholecalciferol (VITAMIN D) 1000 units tablet Take 1 tablet (1,000 Units total) by mouth daily.     Docusate Sodium (DSS) 100 MG CAPS Take by mouth.     furosemide (LASIX) 20 MG tablet TAKE ONE (1) TABLET BY MOUTH EVERY DAY 30 tablet 1   letrozole (FEMARA) 2.5 MG  tablet TAKE ONE (1) TABLET EACH DAY 90 tablet 3   levETIRAcetam (KEPPRA) 500 MG tablet Take 1 tablet (500 mg total) by mouth 2 (two) times daily. 60 tablet 3   levothyroxine (SYNTHROID) 100 MCG tablet TAKE ONE TABLET EACH MORNING BEFORE BREAKFAST 90 tablet 2   lidocaine-prilocaine (EMLA) cream APPLY TOPICALLY AS NEEDED FOR PORT ACCESS 30 g 3   metoprolol succinate (TOPROL-XL) 100 MG 24 hr tablet TAKE ONE (1) TABLET EACH DAY 90 tablet 1   Multiple Vitamins-Minerals (MULTIVITAMIN WITH MINERALS) tablet Take 1 tablet by mouth daily.     ondansetron (ZOFRAN-ODT) 4 MG disintegrating tablet TAKE 1 TABLET EVERY 6 HOURS AS NEEDED FOR NAUSEA AND VOMITING 30 tablet 3   potassium chloride SA (KLOR-CON) 20 MEQ tablet Take 1 tablet (20 mEq total) by mouth 2 (two) times daily for 7 days. 14 tablet 0   predniSONE (DELTASONE) 50 MG tablet Patient to take 13 hours, 7 hours, and 1 hour prior to CT scan 3 tablet 0   rosuvastatin (CRESTOR) 5 MG tablet Take 1 tablet (  5 mg total) by mouth daily. (new directions) 90 tablet 1   triamcinolone (KENALOG) 0.025 % ointment Apply 1 application topically 2 (two) times daily. 80 g 1   valsartan (DIOVAN) 80 MG tablet TAKE ONE (1) TABLET EACH DAY 90 tablet 1   No current facility-administered medications for this visit.    PHYSICAL EXAMINATION: ECOG PERFORMANCE STATUS: 1 - Symptomatic but completely ambulatory  Vitals:   04/08/21 0947  BP: 138/79  Pulse: 86  Resp: 18  Temp: 97.7 F (36.5 C)  SpO2: 100%   Filed Weights   04/08/21 0947  Weight: 157 lb 1.6 oz (71.3 kg)     LABORATORY DATA:  I have reviewed the data as listed CMP Latest Ref Rng & Units 03/29/2021 03/19/2021 02/26/2021  Glucose 70 - 99 mg/dL 101(H) 111(H) 89  BUN 8 - 23 mg/dL 11 12 22   Creatinine 0.44 - 1.00 mg/dL 0.73 0.84 0.80  Sodium 135 - 145 mmol/L 137 140 137  Potassium 3.5 - 5.1 mmol/L 2.8(L) 3.2(L) 3.6  Chloride 98 - 111 mmol/L 99 101 101  CO2 22 - 32 mmol/L 28 28 29   Calcium 8.9 - 10.3  mg/dL 9.0 10.1 9.3  Total Protein 6.5 - 8.1 g/dL - 6.4(L) 6.3(L)  Total Bilirubin 0.3 - 1.2 mg/dL - 0.9 0.9  Alkaline Phos 38 - 126 U/L - 217(H) 220(H)  AST 15 - 41 U/L - 48(H) 67(H)  ALT 0 - 44 U/L - 22 59(H)    Lab Results  Component Value Date   WBC 4.0 04/08/2021   HGB 11.3 (L) 04/08/2021   HCT 33.2 (L) 04/08/2021   MCV 109.2 (H) 04/08/2021   PLT 138 (L) 04/08/2021   NEUTROABS 1.3 (L) 04/08/2021    ASSESSMENT & PLAN:  Breast cancer of upper-outer quadrant of left female breast ([place]) Current treatment:  Abemaciclib with letrozole along with Herceptin (to be given every 4 weeks) and Xgeva started 06/15/2018   CT CAP 05/25/2020: No significant interval change in the bone metastases. 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 Mickeal Skinner is adding Avastin to the treatment. Today is cycle 1   CTCAP 11/26/2020: New hepatic lesions worrisome for hepatic mets (1.6 cm, 1.2 cm, 2.3 cm)   Recommendation: 1. Biopsy of the liver lesion.  12/11/2020: Metastatic carcinoma, ER 75% week to moderate, PR 0%, Ki-67 25%, HER-2 equivocal by IHC, FISH positive ratio 2.97, copy #5.2 2. Change treatment plan: Kadcyla along with abemaciclib and letrozole ---------------------------------------------------------------------------------------------------------------------  Current Treatment: Kadcyla q [redacted] weeks along with Verzenio and letrozole. Today is cycle 5 (she completed bevacizumab x4)   Kadcyla toxicities:  1.  Fatigue as a result of chemotherapy: Stable 2. chemo induced anemia: Monitoring closely, today's hemoglobin is 11.3 3. peripheral neuropathy 4.  Emergency room visit for leg discomfort: Hypokalemic sent home with potassium replacement therapy 5.  Leukopenia: Related to Verzenio, stable  Nonhealing wound on the right  leg: I will send for Augmentin for 2 weeks. Easy bruising on the hands: Could be the effect of prior Avastin therapy   Abemaciclib toxicities: She had 1 episode of diarrhea which was controlled with Imodium.   CT CAP 04/02/21: Unchanged  Hepatic and osseous disease Based on stable findings of the CT scans we will continue with the same treatment. Return to clinic every 3 weeks for Kadcyla.  No orders of the defined types were placed in this encounter.  The patient has a good understanding of the overall plan. she agrees with it. she will call with any problems that may develop before the next visit here.  Total time spent: 30 mins including face to face time and time spent for planning, charting and coordination of care  Rulon Eisenmenger, MD, MPH 04/08/2021  I, Thana Ates, am acting as scribe for Dr. Nicholas Lose.  I have reviewed the above documentation for accuracy and completeness, and I agree with the above.

## 2021-04-07 NOTE — Assessment & Plan Note (Signed)
Current treatment: Abemaciclibwith letrozole along with Herceptin(to be given every 4 weeks)and Xgeva started 06/15/2018  CT CAP 05/25/2020: No significant interval change in the bone metastases. 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 Mickeal Skinner is adding Avastin to the treatment.Today is cycle 1  CTCAP2/07/2021: New hepatic lesions worrisome for hepatic mets (1.6 cm, 1.2 cm, 2.3 cm)  Recommendation: 1. Biopsy of the liver lesion.12/11/2020: Metastatic carcinoma, ER 75% week to moderate, PR 0%, Ki-67 25%, HER-2 equivocal by IHC, FISH positive ratio 2.97, copy #5.2 2. Change treatment plan:Kadcylaalong withabemaciclib and letrozole --------------------------------------------------------------------------------------------------------------------- Current Treatment:Kadcyla q [redacted] weeks along with Verzenio. Today is cycle 4(she completed bevacizumab x4)  Kadcyla toxicities: 1.Fatigue as a result of chemotherapy: Stable 2.chemo induced anemia: Monitoring closely 3. peripheral neuropathy: Patient has been explained treatment and neuropathy in the hands and feet.  Sometimes she has to drop objects.  It is not all the time.  Recent fall and x-rays revealed a chronic fracture in the pelvis. Hoarseness of voice: Unclear etiology.  We will see what the scans show. Nonhealing wound on the right leg: I sent a prescription for Keflex. Easy bruising on the hands: Could be the effect of prior Avastin therapy  Abemaciclib toxicities: She had 1 episode of diarrhea which was controlled with Imodium.  CT CAP 04/02/21: Unchanged  Hepatic and osseous disease

## 2021-04-08 ENCOUNTER — Inpatient Hospital Stay: Payer: Medicare Other

## 2021-04-08 ENCOUNTER — Other Ambulatory Visit: Payer: Self-pay

## 2021-04-08 ENCOUNTER — Inpatient Hospital Stay (HOSPITAL_BASED_OUTPATIENT_CLINIC_OR_DEPARTMENT_OTHER): Payer: Medicare Other | Admitting: Hematology and Oncology

## 2021-04-08 ENCOUNTER — Other Ambulatory Visit: Payer: Self-pay | Admitting: *Deleted

## 2021-04-08 DIAGNOSIS — C50412 Malignant neoplasm of upper-outer quadrant of left female breast: Secondary | ICD-10-CM

## 2021-04-08 DIAGNOSIS — Z170 Estrogen receptor positive status [ER+]: Secondary | ICD-10-CM | POA: Diagnosis not present

## 2021-04-08 DIAGNOSIS — Z79899 Other long term (current) drug therapy: Secondary | ICD-10-CM

## 2021-04-08 DIAGNOSIS — C50919 Malignant neoplasm of unspecified site of unspecified female breast: Secondary | ICD-10-CM

## 2021-04-08 DIAGNOSIS — S81801A Unspecified open wound, right lower leg, initial encounter: Secondary | ICD-10-CM

## 2021-04-08 DIAGNOSIS — Z5112 Encounter for antineoplastic immunotherapy: Secondary | ICD-10-CM | POA: Diagnosis not present

## 2021-04-08 DIAGNOSIS — Z95828 Presence of other vascular implants and grafts: Secondary | ICD-10-CM

## 2021-04-08 LAB — CBC WITH DIFFERENTIAL (CANCER CENTER ONLY)
Abs Immature Granulocytes: 0.01 10*3/uL (ref 0.00–0.07)
Basophils Absolute: 0.1 10*3/uL (ref 0.0–0.1)
Basophils Relative: 1 %
Eosinophils Absolute: 0.1 10*3/uL (ref 0.0–0.5)
Eosinophils Relative: 2 %
HCT: 33.2 % — ABNORMAL LOW (ref 36.0–46.0)
Hemoglobin: 11.3 g/dL — ABNORMAL LOW (ref 12.0–15.0)
Immature Granulocytes: 0 %
Lymphocytes Relative: 52 %
Lymphs Abs: 2.1 10*3/uL (ref 0.7–4.0)
MCH: 37.2 pg — ABNORMAL HIGH (ref 26.0–34.0)
MCHC: 34 g/dL (ref 30.0–36.0)
MCV: 109.2 fL — ABNORMAL HIGH (ref 80.0–100.0)
Monocytes Absolute: 0.5 10*3/uL (ref 0.1–1.0)
Monocytes Relative: 12 %
Neutro Abs: 1.3 10*3/uL — ABNORMAL LOW (ref 1.7–7.7)
Neutrophils Relative %: 33 %
Platelet Count: 138 10*3/uL — ABNORMAL LOW (ref 150–400)
RBC: 3.04 MIL/uL — ABNORMAL LOW (ref 3.87–5.11)
RDW: 15.9 % — ABNORMAL HIGH (ref 11.5–15.5)
WBC Count: 4 10*3/uL (ref 4.0–10.5)
nRBC: 0 % (ref 0.0–0.2)

## 2021-04-08 LAB — CMP (CANCER CENTER ONLY)
ALT: 29 U/L (ref 0–44)
AST: 61 U/L — ABNORMAL HIGH (ref 15–41)
Albumin: 3.2 g/dL — ABNORMAL LOW (ref 3.5–5.0)
Alkaline Phosphatase: 209 U/L — ABNORMAL HIGH (ref 38–126)
Anion gap: 11 (ref 5–15)
BUN: 14 mg/dL (ref 8–23)
CO2: 25 mmol/L (ref 22–32)
Calcium: 10 mg/dL (ref 8.9–10.3)
Chloride: 102 mmol/L (ref 98–111)
Creatinine: 0.86 mg/dL (ref 0.44–1.00)
GFR, Estimated: >60 mL/min (ref >60)
Glucose, Bld: 109 mg/dL — ABNORMAL HIGH (ref 70–99)
Potassium: 3.7 mmol/L (ref 3.5–5.1)
Sodium: 138 mmol/L (ref 135–145)
Total Bilirubin: 1.3 mg/dL — ABNORMAL HIGH (ref 0.3–1.2)
Total Protein: 6.6 g/dL (ref 6.5–8.1)

## 2021-04-08 MED ORDER — DIPHENHYDRAMINE HCL 25 MG PO CAPS
ORAL_CAPSULE | ORAL | Status: AC
  Filled 2021-04-08: qty 2

## 2021-04-08 MED ORDER — ACETAMINOPHEN 325 MG PO TABS
650.0000 mg | ORAL_TABLET | Freq: Once | ORAL | Status: AC
  Administered 2021-04-08: 650 mg via ORAL

## 2021-04-08 MED ORDER — AMOXICILLIN-POT CLAVULANATE 875-125 MG PO TABS: 1.0000 | ORAL_TABLET | Freq: Two times a day (BID) | ORAL | 0 refills | Status: DC

## 2021-04-08 MED ORDER — SODIUM CHLORIDE 0.9 % IV SOLN
3.4000 mg/kg | Freq: Once | INTRAVENOUS | Status: AC
  Administered 2021-04-08: 260 mg via INTRAVENOUS
  Filled 2021-04-08: qty 5

## 2021-04-08 MED ORDER — SODIUM CHLORIDE 0.9% FLUSH
10.0000 mL | INTRAVENOUS | Status: AC | PRN
  Administered 2021-04-08: 10 mL
  Filled 2021-04-08: qty 10

## 2021-04-08 MED ORDER — HEPARIN SOD (PORK) LOCK FLUSH 100 UNIT/ML IV SOLN
500.0000 [IU] | Freq: Once | INTRAVENOUS | Status: AC | PRN
  Administered 2021-04-08: 500 [IU]
  Filled 2021-04-08: qty 5

## 2021-04-08 MED ORDER — SODIUM CHLORIDE 0.9 % IV SOLN
Freq: Once | INTRAVENOUS | Status: AC
  Filled 2021-04-08: qty 250

## 2021-04-08 MED ORDER — ACETAMINOPHEN 325 MG PO TABS
ORAL_TABLET | ORAL | Status: AC
  Filled 2021-04-08: qty 2

## 2021-04-08 MED ORDER — DIPHENHYDRAMINE HCL 25 MG PO CAPS
50.0000 mg | ORAL_CAPSULE | Freq: Once | ORAL | Status: AC
  Administered 2021-04-08: 50 mg via ORAL

## 2021-04-08 MED ORDER — SODIUM CHLORIDE 0.9% FLUSH
10.0000 mL | INTRAVENOUS | Status: DC | PRN
  Administered 2021-04-08: 10 mL
  Filled 2021-04-08: qty 10

## 2021-04-08 NOTE — Patient Instructions (Signed)
Wound Care, Adult Taking care of your wound properly can help to prevent pain, infection, and scarring. It can also help your wound heal more quickly. Follow instructionsfrom your health care provider about how to care for your wound. Supplies needed: Soap and water. Wound cleanser. Gauze. If needed, a clean bandage (dressing) or other type of wound dressing material to cover or place in the wound. Follow your health care provider's instructions about what dressing supplies to use. Cream or ointment to apply to the wound, if told by your health care provider. How to care for your wound Cleaning the wound Ask your health care provider how to clean the wound. This may include: Using mild soap and water or a wound cleanser. Using a clean gauze to pat the wound dry after cleaning it. Do not rub or scrub the wound. Dressing care Wash your hands with soap and water for at least 20 seconds before and after you change the dressing. If soap and water are not available, use hand sanitizer. Change your dressing as told by your health care provider. This may include: Cleaning or rinsing out (irrigating) the wound. Placing a dressing over the wound or in the wound (packing). Covering the wound with an outer dressing. Leave any stitches (sutures), skin glue, or adhesive strips in place. These skin closures may need to stay in place for 2 weeks or longer. If adhesive strip edges start to loosen and curl up, you may trim the loose edges. Do not remove adhesive strips completely unless your health care provider tells you to do that. Ask your health care provider when you can leave the wound uncovered. Checking for infection Check your wound area every day for signs of infection. Check for: More redness, swelling, or pain. Fluid or blood. Warmth. Pus or a bad smell.  Follow these instructions at home Medicines If you were prescribed an antibiotic medicine, cream, or ointment, take or apply it as told by  your health care provider. Do not stop using the antibiotic even if your condition improves. If you were prescribed pain medicine, take it 30 minutes before you do any wound care or as told by your health care provider. Take over-the-counter and prescription medicines only as told by your health care provider. Eating and drinking Eat a diet that includes protein, vitamin A, vitamin C, and other nutrient-rich foods to help the wound heal. Foods rich in protein include meat, fish, eggs, dairy, beans, and nuts. Foods rich in vitamin A include carrots and dark green, leafy vegetables. Foods rich in vitamin C include citrus fruits, tomatoes, broccoli, and peppers. Drink enough fluid to keep your urine pale yellow. General instructions Do not take baths, swim, use a hot tub, or do anything that would put the wound underwater until your health care provider approves. Ask your health care provider if you may take showers. You may only be allowed to take sponge baths. Do not scratch or pick at the wound. Keep it covered as told by your health care provider. Return to your normal activities as told by your health care provider. Ask your health care provider what activities are safe for you. Protect your wound from the sun when you are outside for the first 6 months, or for as long as told by your health care provider. Cover up the scar area or apply sunscreen that has an SPF of at least 30. Do not use any products that contain nicotine or tobacco, such as cigarettes, e-cigarettes, and chewing tobacco.   These may delay wound healing. If you need help quitting, ask your health care provider. Keep all follow-up visits as told by your health care provider. This is important. Contact a health care provider if: You received a tetanus shot and you have swelling, severe pain, redness, or bleeding at the injection site. Your pain is not controlled with medicine. You have any of these signs of infection: More  redness, swelling, or pain around the wound. Fluid or blood coming from the wound. Warmth coming from the wound. Pus or a bad smell coming from the wound. A fever or chills. You are nauseous or you vomit. You are dizzy. Get help right away if: You have a red streak of skin near the area around your wound. Your wound has been closed with staples, sutures, skin glue, or adhesive strips and it begins to open up and separate. Your wound is bleeding, and the bleeding does not stop with gentle pressure. You have a rash. You faint. You have trouble breathing. These symptoms may represent a serious problem that is an emergency. Do not wait to see if the symptoms will go away. Get medical help right away. Call your local emergency services (911 in the [place] Do not drive yourself to the hospital. Summary Always wash your hands with soap and water for at least 20 seconds before and after changing your dressing. Change your dressing as told by your health care provider. To help with healing, eat foods that are rich in protein, vitamin A, vitamin C, and other nutrients. Check your wound every day for signs of infection. Contact your health care provider if you suspect that your wound is infected. This information is not intended to replace advice given to you by your health care provider. Make sure you discuss any questions you have with your healthcare provider. Document Revised: 07/19/2019 Document Reviewed: 07/19/2019 [employer] Patient Education  2022 [employer]  

## 2021-04-08 NOTE — Progress Notes (Signed)
Per MD request RN successfully placed referral to [facility] and [place]. RN placed call to ensure referral was received.  Scheduler states referral is in epic and office will contact pt to arrange apts.

## 2021-04-08 NOTE — Progress Notes (Signed)
Anc 1.3, okay to proceed with tx per Dr. Lindi Adie.

## 2021-04-13 ENCOUNTER — Other Ambulatory Visit (HOSPITAL_COMMUNITY): Payer: Self-pay

## 2021-04-14 ENCOUNTER — Other Ambulatory Visit: Payer: Self-pay

## 2021-04-14 ENCOUNTER — Encounter (HOSPITAL_BASED_OUTPATIENT_CLINIC_OR_DEPARTMENT_OTHER): Payer: Medicare Other | Attending: Internal Medicine | Admitting: Internal Medicine

## 2021-04-14 DIAGNOSIS — L97812 Non-pressure chronic ulcer of other part of right lower leg with fat layer exposed: Secondary | ICD-10-CM | POA: Insufficient documentation

## 2021-04-14 DIAGNOSIS — Z87891 Personal history of nicotine dependence: Secondary | ICD-10-CM | POA: Diagnosis not present

## 2021-04-14 DIAGNOSIS — I872 Venous insufficiency (chronic) (peripheral): Secondary | ICD-10-CM | POA: Diagnosis not present

## 2021-04-14 DIAGNOSIS — Z833 Family history of diabetes mellitus: Secondary | ICD-10-CM | POA: Insufficient documentation

## 2021-04-14 NOTE — Progress Notes (Signed)
LANISSA, CASHEN ([id number]) Visit Report for 04/14/2021 Chief Complaint Document Details Patient Name: Date of Service: LATAJAH, THUMAN A. 04/14/2021 7:30 A M Medical Record Number: [id number] Patient Account Number: [id number] Date of Birth/Sex: Treating RN: November 03, 1950 (70 y.o. Female) Baruch Gouty Primary Care Provider: [name] Other Clinician: Referring Provider: Treating Provider/Extender: Lorenso Courier Weeks in Treatment: 0 Information Obtained from: Patient Chief Complaint Right LE Ulcer Electronic Signature(s) Signed: 04/14/2021 8:49:44 AM By: Worthy Keeler PA-C Entered By: Worthy Keeler on 04/14/2021 08:49:44 -------------------------------------------------------------------------------- Debridement Details Patient Name: Date of Service: [place], [name] 04/14/2021 7:30 A M Medical Record Number: [id number] Patient Account Number: [id number] Date of Birth/Sex: Treating RN: 1950-11-23 (70 y.o. Female) Baruch Gouty Primary Care Provider: [name] Other Clinician: Referring Provider: Treating Provider/Extender: Lorenso Courier Weeks in Treatment: 0 Debridement Performed for Assessment: Wound #1 Right,Anterior Lower Leg Performed By: Physician Worthy Keeler, PA Debridement Type: Debridement Level of Consciousness (Pre-procedure): Awake and Alert Pre-procedure Verification/Time Out Yes - 08:50 Taken: Start Time: 08:53 Pain Control: Lidocaine 4% T opical Solution T Area Debrided (L x W): otal 1.4 (cm) x 1.5 (cm) = 2.1 (cm) Tissue and other material debrided: Viable, Non-Viable, Slough, Subcutaneous, Fibrin/Exudate, Slough Level: Skin/Subcutaneous Tissue Debridement Description: Excisional Instrument: Curette Bleeding: Minimum Hemostasis Achieved: Pressure End Time: 08:56 Procedural Pain: 5 Post Procedural Pain: 3 Response to Treatment: Procedure was tolerated well Level of Consciousness (Post- Awake and  Alert procedure): Post Debridement Measurements of Total Wound Length: (cm) 1.4 Width: (cm) 1.5 Depth: (cm) 0.1 Volume: (cm) 0.165 Character of Wound/Ulcer Post Debridement: Improved Post Procedure Diagnosis Same as Pre-procedure Electronic Signature(s) Signed: 04/14/2021 4:36:36 PM By: Worthy Keeler PA-C Signed: 04/14/2021 5:41:46 PM By: Baruch Gouty RN, BSN Entered By: Baruch Gouty on 04/14/2021 08:57:08 -------------------------------------------------------------------------------- HPI Details Patient Name: Date of Service: [place], [name] 04/14/2021 7:30 A M Medical Record Number: [id number] Patient Account Number: [id number] Date of Birth/Sex: Treating RN: 1950/12/06 (70 y.o. Female) Baruch Gouty Primary Care Provider: [name] Other Clinician: Referring Provider: Treating Provider/Extender: Lorenso Courier Weeks in Treatment: 0 History of Present Illness HPI Description: 04/14/2021 upon evaluation today patient presents for initial inspection here in the clinic concerning issues that she has been having with a wound on her right anterior lower leg after she had an abrasion trauma 4 weeks ago. She tells me that unfortunately following this trauma she has been experiencing quite a bit of discomfort at times with some burning and stinging. She is also had a scab up once or twice but states that right now she has been unable to really get it to scab back over. This has been a frustration for her and she is not really able to get it to heal as quickly and effectively as she would like to see. The patient also tells me that she has been tolerating the dressing changes without complication. She has just been using over-the-counter antibiotic ointment and try to keep it covered with a dry dressing at times depending on what she was told to do. Nonetheless right now she is also been on Keflex that did not really seem to help she was switched over to  Augmentin she does started that on Friday. Currently I do not see anything that seems to be obviously infected but nonetheless I think it is fine for her to take the Augmentin just to make sure that we do not get anything  that initiates in the interim. The patient is currently undergoing chemotherapy she has had previous radiation. Nonetheless in regard to the chemotherapy this could be affecting her ability to heal to some degree. 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 She has hypertension, neuropathy associated with the chemotherapy, and a history of heart disease. She is currently again undergoing active chemotherapy for recurrent breast cancer. Electronic Signature(s) Signed: 04/14/2021 9:01:16 AM By: Worthy Keeler PA-C Entered By: Worthy Keeler on 04/14/2021 09:01:15 -------------------------------------------------------------------------------- Physical Exam Details Patient Name: Date of Service: [place], [name] 04/14/2021 7:30 A M Medical Record Number: [id number] Patient Account Number: [id number] Date of Birth/Sex: Treating RN: June 10, 1951 (70 y.o. Female) Baruch Gouty Primary Care Provider: [name] Other Clinician: Referring Provider: Treating Provider/Extender: Lorenso Courier Weeks in Treatment: 0 Constitutional sitting or standing blood pressure is within target range for patient.. pulse regular and within target range for patient.Marland Kitchen respirations regular, non-labored and within target range for patient.Marland Kitchen temperature within target range for patient.. Well-nourished and well-hydrated in no acute distress. Eyes conjunctiva clear no eyelid edema noted. pupils equal round and reactive to light and accommodation. Ears, Nose, Mouth, and Throat no gross abnormality of ear auricles or external auditory canals. normal hearing noted during  conversation. mucus membranes moist. Respiratory normal breathing without difficulty. Cardiovascular 2+ dorsalis pedis/posterior tibialis pulses. 1+ pitting edema of the bilateral lower extremities. Musculoskeletal normal gait and posture. no significant deformity or arthritic changes, no loss or range of motion, no clubbing. Psychiatric this patient is able to make decisions and demonstrates good insight into disease process. Alert and Oriented x 3. pleasant and cooperative. Notes Upon inspection patient's wound bed actually did have some slough and biofilm buildup on the surface of the wound. Nonetheless I discussed with her that I did not feel like she was going to require debridement to clear some of this away. She agreed and subsequently I did use a #3 curette to debride away some of the necrotic tissue here. She tolerated that without complication minimal pain was noted and then she actually did quite well in my opinion. At 1 point he did inquire as to whether or not we had laughing gas and not even certain this was really a joke but nonetheless she tells me that when she goes to the dentist she normally requires that. Either way she did excellent with the debridement and did not appear to be in too much discomfort which is great news. Electronic Signature(s) Signed: 04/14/2021 9:02:15 AM By: Worthy Keeler PA-C Entered By: Worthy Keeler on 04/14/2021 09:02:14 -------------------------------------------------------------------------------- Physician Orders Details Patient Name: Date of Service: [place], [name] 04/14/2021 7:30 A M Medical Record Number: [id number] Patient Account Number: [id number] Date of Birth/Sex: Treating RN: 03-05-1951 (70 y.o. 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 Bathing/  Shower/ Hygiene May shower with protection but do not get wound dressing(s) wet. Edema Control - Lymphedema / SCD / Other Right Lower Extremity Elevate legs to the level of the heart or above for 30 minutes daily and/or when sitting, a frequency of: - 3-4 times per day Avoid standing for long periods of time. Exercise regularly Wound Treatment Wound #1 - Lower Leg Wound Laterality: Right, Anterior Peri-Wound Care: Sween Lotion (Moisturizing lotion) 1 x Per Week/15 Days Discharge Instructions: Apply moisturizing lotion as directed Prim Dressing: Hydrofera Blue Classic Foam, 2x2 in 1 x Per Week/15 Days ary Discharge Instructions: Moisten with saline prior to applying to wound bed Secondary Dressing: Woven Gauze Sponge, Non-Sterile 4x4 in 1 x Per Week/15 Days Discharge Instructions: Apply over primary dressing as directed. Compression Wrap: ThreePress (3 layer compression wrap) 1 x Per Week/15 Days Discharge Instructions: Apply three layer compression as directed. Patient Medications llergies: cantaloupe extract, Iodinated Contrast Media, pravastatin, Zosyn, latex A Notifications Medication Indication Start End prior to debridement 04/14/2021 lidocaine DOSE topical 4 % cream - cream topical Electronic Signature(s) Signed: 04/14/2021 4:36:36 PM By: Worthy Keeler PA-C Signed: 04/14/2021 5:41:46 PM By: Baruch Gouty RN,  BSN Entered By: Baruch Gouty on 04/14/2021 08:59:18 -------------------------------------------------------------------------------- Problem List Details Patient Name: Date of Service: [place], [name] 04/14/2021 7:30 A M Medical Record Number: [id number] Patient Account Number: [id number] Date of Birth/Sex: Treating RN: October 02, 1951 (70 y.o. Female) Baruch Gouty Primary Care Provider: [name] Other Clinician: Referring Provider: Treating Provider/Extender: Lorenso Courier Weeks in Treatment: 0 Active Problems ICD-10 Encounter Code Description Active Date MDM Diagnosis S80.811A Abrasion, right lower leg, initial encounter 04/14/2021 No Yes I87.2 Venous insufficiency (chronic) (peripheral) 04/14/2021 No Yes L97.812 Non-pressure chronic ulcer of other part of right lower leg with fat layer 04/14/2021 No Yes exposed G90.09 Other idiopathic peripheral autonomic neuropathy 04/14/2021 No Yes C79.81 Secondary malignant neoplasm of breast 04/14/2021 No Yes I10 Essential (primary) hypertension 04/14/2021 No Yes I25.10 Atherosclerotic heart disease of native coronary artery without angina pectoris 04/14/2021 No Yes Z92.21 Personal history of antineoplastic chemotherapy 04/14/2021 No Yes Inactive Problems Resolved Problems Electronic Signature(s) Signed: 04/14/2021 8:49:29 AM By: Worthy Keeler PA-C Entered By: Worthy Keeler on 04/14/2021 08:49:28 -------------------------------------------------------------------------------- Progress Note Details Patient Name: Date of Service: [place], [name] 04/14/2021 7:30 A M Medical Record Number: [id number] Patient Account Number: [id number] Date of Birth/Sex: Treating RN: 07-Oct-1951 (70 y.o. Female) Baruch Gouty Primary Care Provider: [name] Other Clinician: Referring Provider: Treating Provider/Extender: Lorenso Courier Weeks in Treatment: 0 Subjective Chief Complaint Information obtained from  Patient Right LE Ulcer History of Present Illness (HPI) 04/14/2021 upon evaluation today patient presents for initial inspection here in the clinic concerning issues that she has been having with a wound on her right anterior lower leg after she had an abrasion trauma 4 weeks ago. She tells me that unfortunately following this trauma she has been experiencing quite a bit of discomfort at times with some burning and stinging. She is also had a scab up once or twice but states that right now she has been unable to really get it to scab back over. This has been a frustration for her and she is not really able to get it to heal as quickly and effectively as she would like to see. The patient also tells me that she has been tolerating the dressing changes without complication. She has just been using over-the-counter antibiotic ointment and try  to keep it covered with a dry dressing at times depending on what she was told to do. Nonetheless right now she is also been on Keflex that did not really seem to help she was switched over to Augmentin she does started that on Friday. Currently I do not see anything that seems to be obviously infected but nonetheless I think it is fine for her to take the Augmentin just to make sure that we do not get anything that initiates in the interim. The patient is currently undergoing chemotherapy she has had previous radiation. Nonetheless in regard to the chemotherapy this could be affecting her ability to heal to some degree. 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 She has hypertension, neuropathy associated with the chemotherapy, and a history of heart disease. She is currently again undergoing active chemotherapy for recurrent breast cancer. Patient History Information obtained from Patient. Allergies cantaloupe extract, Iodinated Contrast Media,  pravastatin, Zosyn, latex Family History Cancer - Father,Paternal Grandparents, Diabetes - Maternal Grandparents, Heart Disease - Maternal Grandparents, Hypertension - Maternal Grandparents, Lung Disease - Paternal Grandparents, No family history of Hereditary Spherocytosis, Kidney Disease, Seizures, Stroke, Thyroid Problems, Tuberculosis. Social History Former smoker, Marital Status - Married, Alcohol Use - Never, Drug Use - No History, Caffeine Use - Moderate. Medical History Cardiovascular Patient has history of Coronary Artery Disease, Hypertension, Myocardial Infarction Neurologic Patient has history of Neuropathy Oncologic Patient has history of Received Chemotherapy - Left Breast, Received Radiation Hospitalization/Surgery History - Double Mastectomy 2013. - Hysterectomy 1998. Medical A Surgical History Notes nd Endocrine Hypothyroidism Oncologic Double mastectomy Review of Systems (ROS) Eyes Complains or has symptoms of Glasses / Contacts. Ear/Nose/Mouth/Throat Denies complaints or symptoms of Chronic sinus problems or rhinitis. Respiratory Denies complaints or symptoms of Chronic or frequent coughs, Shortness of Breath. Gastrointestinal Complains or has symptoms of Frequent diarrhea - medication related. Denies complaints or symptoms of Nausea, Vomiting. Genitourinary Denies complaints or symptoms of Frequent urination. Integumentary (Skin) Complains or has symptoms of Wounds. Musculoskeletal Denies complaints or symptoms of Muscle Pain, Muscle Weakness. Psychiatric Denies complaints or symptoms of Claustrophobia, Suicidal. Objective Constitutional sitting or standing blood pressure is within target range for patient.. pulse regular and within target range for patient.Marland Kitchen respirations regular, non-labored and within target range for patient.Marland Kitchen temperature within target range for patient.. Well-nourished and well-hydrated in no acute distress. Vitals Time Taken: 7:52  AM, Height: 68 in, Source: Stated, Weight: 157 lbs, Source: Stated, BMI: 23.9, Temperature: 98.3 F, Pulse: 98 bpm, Respiratory Rate: 16 breaths/min, Blood Pressure: 132/74 mmHg. Eyes conjunctiva clear no eyelid edema noted. pupils equal round and reactive to light and accommodation. Ears, Nose, Mouth, and Throat no gross abnormality of ear auricles or external auditory canals. normal hearing noted during conversation. mucus membranes moist. Respiratory normal breathing without difficulty. Cardiovascular 2+ dorsalis pedis/posterior tibialis pulses. 1+ pitting edema of the bilateral lower extremities. Musculoskeletal normal gait and posture. no significant deformity or arthritic changes, no loss or range of motion, no clubbing. Psychiatric this patient is able to make decisions and demonstrates good insight into disease process. Alert and Oriented x 3. pleasant and cooperative. General Notes: Upon inspection patient's wound bed actually did have some slough and biofilm buildup on the surface of the wound. Nonetheless I discussed with her that I did not feel like she was going to require debridement to clear some of this away. She agreed and subsequently  I did use a #3 curette to debride away some of the necrotic tissue here. She tolerated that without complication minimal pain was noted and then she actually did quite well in my opinion. At 1 point he did inquire as to whether or not we had laughing gas and not even certain this was really a joke but nonetheless she tells me that when she goes to the dentist she normally requires that. Either way she did excellent with the debridement and did not appear to be in too much discomfort which is great news. Integumentary (Hair, Skin) Wound #1 status is Open. Original cause of wound was Trauma. The date acquired was: 03/17/2021. The wound is located on the Right,Anterior Lower Leg. The wound measures 1.4cm length x 1.5cm width x 0.1cm depth; 1.649cm^2  area and 0.165cm^3 volume. There is Fat Layer (Subcutaneous Tissue) exposed. There is no tunneling or undermining noted. There is a medium amount of serosanguineous drainage noted. The wound margin is distinct with the outline attached to the wound base. There is large (67-100%) red, pink granulation within the wound bed. There is a small (1-33%) amount of necrotic tissue within the wound bed including Adherent Slough. 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 There was a Excisional Skin/Subcutaneous Tissue Debridement with a total area of 2.1 sq cm performed by Worthy Keeler, PA. With the following instrument(s): Curette to remove Viable and Non-Viable tissue/material. Material removed includes Subcutaneous Tissue, Slough, and Fibrin/Exudate after achieving pain control using Lidocaine 4% Topical Solution. No specimens were taken. A time out was conducted at 08:50, prior to the start of the procedure. A Minimum amount of bleeding was controlled with Pressure. The procedure was tolerated well with a pain level of 5 throughout and a pain level of 3 following the procedure. Post Debridement Measurements: 1.4cm length x 1.5cm width x 0.1cm depth; 0.165cm^3 volume. Character of Wound/Ulcer Post Debridement is improved. Post procedure Diagnosis Wound #1: Same as Pre-Procedure Pre-procedure diagnosis of Wound #1 is a Trauma, Other located on the Right,Anterior Lower Leg .  There was a Three Layer Compression Therapy Procedure by Leane Call, RN. Post procedure Diagnosis Wound #1: Same as Pre-Procedure Plan Follow-up Appointments: Return Appointment in 1 week. Bathing/ Shower/ Hygiene: May shower with protection but do not get wound dressing(s) wet. Edema Control - Lymphedema / SCD / Other: Elevate legs to the level of the heart or above for 30 minutes daily and/or when sitting, a frequency of: - 3-4 times per day Avoid standing for long periods of time. Exercise regularly The following medication(s) was prescribed: lidocaine topical 4 % cream cream topical for prior to debridement was prescribed at facility WOUND #1: - Lower Leg Wound Laterality: Right, Anterior Peri-Wound Care: Sween Lotion (Moisturizing lotion) 1 x Per Week/15 Days Discharge Instructions: Apply moisturizing lotion as directed Prim Dressing: Hydrofera Blue Classic Foam, 2x2 in 1 x Per Week/15 Days ary Discharge Instructions: Moisten with saline prior to applying to wound bed Secondary Dressing: Woven Gauze Sponge, Non-Sterile 4x4 in 1 x Per Week/15 Days Discharge Instructions: Apply over primary dressing as directed. Com pression  Wrap: ThreePress (3 layer compression wrap) 1 x Per Week/15 Days Discharge Instructions: Apply three layer compression as directed. 1. Would recommend that we go ahead and initiate treatment with [facility] I think this is probably can to be the best option and the patient is in agreement with that plan. 2. I am also going to recommend that we have the patient elevate her legs were also can use a compression wrap here in the clinic to try to help with edema control. I recommend a 3 layer compression wrap. 3. I am also going to suggest that we have the patient continue to monitor for any signs of worsening from the standpoint of increased pain if she has any issues she should let me know obviously she will be elevating her legs and again I am hopeful this  will also be beneficial. 5. I am also can recommend that the patient should We will see patient back for reevaluation in 1 week here in the clinic. If anything worsens or changes patient will contact our office for additional recommendations. Electronic Signature(s) Signed: 04/14/2021 9:03:36 AM By: Worthy Keeler PA-C Entered By: Worthy Keeler on 04/14/2021 09:03:36 -------------------------------------------------------------------------------- HxROS Details Patient Name: Date of Service: [place], [name] 04/14/2021 7:30 A M Medical Record Number: [id number] Patient Account Number: [id number] Date of Birth/Sex: Treating RN: [birth year] (70 y.o. Female) [name] Primary Care Provider: [name] Other Clinician: Referring Provider: Treating Provider/Extender: Lorenso Courier Weeks in Treatment: 0 Information Obtained From Patient Eyes Complaints and Symptoms: Positive for: Glasses / Contacts Ear/Nose/Mouth/Throat Complaints and Symptoms: Negative for: Chronic sinus problems or rhinitis Respiratory Complaints and Symptoms: Negative for: Chronic or frequent coughs; Shortness of Breath Gastrointestinal Complaints and Symptoms: Positive for: Frequent diarrhea - medication related Negative for: Nausea; Vomiting Genitourinary Complaints and Symptoms: Negative for: Frequent urination Integumentary (Skin) Complaints and Symptoms: Positive for: Wounds Musculoskeletal Complaints and Symptoms: Negative for: Muscle Pain; Muscle Weakness Psychiatric Complaints and Symptoms: Negative for: Claustrophobia; Suicidal Hematologic/Lymphatic Cardiovascular Medical History: Positive for: Coronary Artery Disease; Hypertension; Myocardial Infarction Endocrine Medical History: Past Medical History Notes: Hypothyroidism Immunological Neurologic Medical History: Positive for: Neuropathy Oncologic Medical History: Positive for: Received Chemotherapy - Left  Breast; Received Radiation Past Medical History Notes: Double mastectomy Immunizations Pneumococcal Vaccine: Received Pneumococcal Vaccination: No Implantable Devices Yes Hospitalization / Surgery History Type of Hospitalization/Surgery Double Mastectomy 2013 Hysterectomy 1998 Family and Social History Cancer: Yes - Father,Paternal Grandparents; Diabetes: Yes - Maternal Grandparents; Heart Disease: Yes - Maternal Grandparents; Hereditary Spherocytosis: No; Hypertension: Yes - Maternal Grandparents; Kidney Disease: No; Lung Disease: Yes - Paternal Grandparents; Seizures: No; Stroke: No; Thyroid Problems: No; Tuberculosis: No; Former smoker; Marital Status - Married; Alcohol Use: Never; Drug Use: No History; Caffeine Use: Moderate; Financial Concerns: No; Food, Clothing or Shelter Needs: No; Support System Lacking: No; Transportation Concerns: No Electronic Signature(s) Signed: 04/14/2021 4:36:36 PM By: Worthy Keeler PA-C Signed: 04/14/2021 5:38:09 PM By: [name] Entered By: [name] on 04/14/2021 08:03:54 -------------------------------------------------------------------------------- SuperBill Details Patient Name: Date of Service: [place], [name] 04/14/2021 Medical Record Number: [id number] Patient Account Number: [id number] Date of Birth/Sex: Treating RN: Nov 10, 1950 (70 y.o. 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 I10 Essential (primary) hypertension I25.10 Atherosclerotic heart  disease of native coronary artery without angina pectoris Z92.21 Personal history of antineoplastic chemotherapy Facility Procedures CPT4 Code: [id number] Description: 99213 - WOUND CARE VISIT-LEV 3 EST PT Modifier: Quantity: 1 CPT4 Code: 25053976 Description: [place] - DEB SUBQ TISSUE 20 SQ CM/< ICD-10 Diagnosis Description B34.193 Non-pressure chronic ulcer of other part of right lower leg with fat layer expos Modifier: ed Quantity: 1 Physician Procedures : CPT4 Code Description Modifier [id number] 73532 - WC PHYS LEVEL 4 - NEW PT 25 ICD-10 Diagnosis Description S80.811A Abrasion, right lower leg, initial encounter I87.2 Venous insufficiency (chronic) (peripheral) L97.812 Non-pressure chronic ulcer of other  part of right lower leg with fat layer exposed 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

## 2021-04-14 NOTE — Progress Notes (Signed)
[name], Riley ([id number]) Visit Report for 04/14/2021 Abuse/Suicide Risk Screen Details Patient Name: Date of Service: [name] Riley, [name] A. 04/14/2021 7:30 A M Medical Record Number: [id number] Patient Account Number: [id number] Date of Birth/Sex: Treating RN: Oct 15, 1951 (70 y.o. Female) [name] Primary Care Santiaga Butzin: [name] Other Clinician: Referring Ferrah Panagopoulos: Treating Celester Lech/Extender: Lorenso Courier Weeks in Treatment: 0 Abuse/Suicide Risk Screen Items Answer ABUSE RISK SCREEN: Has anyone close to you tried to hurt or harm you recentlyo No Do you feel uncomfortable with anyone in your familyo No Has anyone forced you do things that you didnt want to doo No Electronic Signature(s) Signed: 04/14/2021 5:38:09 PM By: [name] Entered By: [name] on 04/14/2021 08:04:02 -------------------------------------------------------------------------------- Activities of Daily Living Details Patient Name: Date of Service: [name], [name] 04/14/2021 7:30 A M Medical Record Number: [id number] Patient Account Number: [id number] Date of Birth/Sex: Treating RN: 1951-06-02 (70 y.o. Female) [name] Primary Care Syrita Dovel: [name] Other Clinician: Referring Kao Conry: Treating Autymn Omlor/Extender: Lorenso Courier Weeks in Treatment: 0 Activities of Daily Living Items Answer Activities of Daily Living (Please select one for each item) Drive Automobile Completely Able T Medications ake Completely Able Use T elephone Completely Able Care for Appearance Completely Able Use T oilet Completely Able Bath / Shower Completely Able Dress Self Completely Able Feed Self Completely Able Walk Completely Able Get In / Out Bed Completely Able Housework Completely Able Prepare Meals Completely [place] for Self Completely Able Electronic Signature(s) Signed: 04/14/2021 5:38:09 PM By: [name] Entered  By: [name] on 04/14/2021 08:04:34 -------------------------------------------------------------------------------- Education Screening Details Patient Name: Date of Service: [name] RO Riley A. 04/14/2021 7:30 A M Medical Record Number: [id number] Patient Account Number: [id number] Date of Birth/Sex: Treating RN: 28-Nov-1950 (70 y.o. Female) [name] Primary Care Jayle Solarz: [name] Other Clinician: Referring Merriel Zinger: Treating Tristan Proto/Extender: Dois Davenport in Treatment: 0 Primary Learner Assessed: Patient Learning Preferences/Education Level/Primary Language Learning Preference: Explanation, Demonstration, Printed Material Highest Education Level: High School Preferred Language: English Cognitive Barrier Language Barrier: No Translator Needed: No Memory Deficit: No Emotional Barrier: No Cultural/Religious Beliefs Affecting Medical Care: No Physical Barrier Impaired Vision: Yes Glasses Impaired Hearing: No Decreased Hand dexterity: No Knowledge/Comprehension Knowledge Level: High Comprehension Level: High Ability to understand written instructions: High Ability to understand verbal instructions: High Motivation Anxiety Level: Calm Cooperation: Cooperative Education Importance: Acknowledges Need Interest in Health Problems: Asks Questions Perception: Coherent Willingness to Engage in Self-Management High Activities: Readiness to Engage in Self-Management High Activities: Electronic Signature(s) Signed: 04/14/2021 5:38:09 PM By: [name] Entered By: [name] on 04/14/2021 08:05:43 -------------------------------------------------------------------------------- Fall Risk Assessment Details Patient Name: Date of Service: [name], [place] RO Riley A. 04/14/2021 7:30 A M Medical Record Number: [id number] Patient Account Number: [id number] Date of Birth/Sex: Treating RN: 1950/12/16 (70 y.o. Female) [name] Primary Care  Hollis Oh: [name] Other Clinician: Referring Araceli Arango: Treating Tej Murdaugh/Extender: Lorenso Courier Weeks in Treatment: 0 Fall Risk Assessment Items Have you had 2 or more falls in the last 12 monthso 0 No Have you had any fall that resulted in injury in the last 12 monthso 0 No FALLS RISK SCREEN History of falling - immediate or within 3 months 25 Yes Secondary diagnosis (Do you have 2 or more medical diagnoseso) 0 No Ambulatory aid None/bed rest/wheelchair/nurse 0 Yes Crutches/cane/walker 0 No Furniture 0 No Intravenous therapy Access/Saline/Heparin Lock 0 No Gait/Transferring Normal/ bed rest/  wheelchair 0 Yes Weak (short steps with or without shuffle, stooped but able to lift head while walking, may seek 0 No support from furniture) Impaired (short steps with shuffle, may have difficulty arising from chair, head down, impaired 0 No balance) Mental Status Oriented to own ability 0 Yes Electronic Signature(s) Signed: 04/14/2021 5:38:09 PM By: [name] Entered By: [name] on 04/14/2021 08:06:03 -------------------------------------------------------------------------------- Foot Assessment Details Patient Name: Date of Service: [name], [place] RO Riley A. 04/14/2021 7:30 A M Medical Record Number: [id number] Patient Account Number: [id number] Date of Birth/Sex: Treating RN: 06-22-1951 (70 y.o. Female) [name] Primary Care Adriane Gabbert: [name] Other Clinician: Referring Rhealynn Myhre: Treating Anais Denslow/Extender: Lorenso Courier Weeks in Treatment: 0 Foot Assessment Items Site Locations + = Sensation present, - = Sensation absent, C = Callus, U = Ulcer R = Redness, W = Warmth, M = Maceration, PU = Pre-ulcerative lesion F = Fissure, S = Swelling, D = Dryness Assessment Right: Left: Other Deformity: No No Prior Foot Ulcer: No No Prior Amputation: No No Charcot Joint: No No Ambulatory Status: Ambulatory Without Help Gait:  Steady Electronic Signature(s) Signed: 04/14/2021 5:38:09 PM By: [name] Entered By: [name] on 04/14/2021 08:09:20 -------------------------------------------------------------------------------- Nutrition Risk Screening Details Patient Name: Date of Service: [name] Riley A. 04/14/2021 7:30 A M Medical Record Number: [id number] Patient Account Number: [id number] Date of Birth/Sex: Treating RN: June 08, 1951 (70 y.o. Female) [name] Primary Care Basir Niven: [name] Other Clinician: Referring Nyelah Emmerich: Treating Brody Bonneau/Extender: Lorenso Courier Weeks in Treatment: 0 Height (in): 68 Weight (lbs): 157 Body Mass Index (BMI): 23.9 Nutrition Risk Screening Items Score Screening NUTRITION RISK SCREEN: I have an illness or condition that made me change the kind and/or amount of food I eat 0 No I eat fewer than two meals per day 0 No I eat few fruits and vegetables, or milk products 0 No I have three or more drinks of beer, liquor or wine almost every day 0 No I have tooth or mouth problems that make it hard for me to eat 0 No I don't always have enough money to buy the food I need 0 No I eat alone most of the time 0 No I take three or more different prescribed or over-the-counter drugs a day 1 Yes Without wanting to, I have lost or gained 10 pounds in the last six months 0 No I am not always physically able to shop, cook and/or feed myself 0 No Nutrition Protocols Good Risk Protocol 0 No interventions needed Moderate Risk Protocol High Risk Proctocol Risk Level: Good Risk Score: 1 Electronic Signature(s) Signed: 04/14/2021 5:38:09 PM By: [name] Entered By: [name] on 04/14/2021 08:07:48

## 2021-04-15 ENCOUNTER — Other Ambulatory Visit (HOSPITAL_COMMUNITY): Payer: Self-pay

## 2021-04-15 ENCOUNTER — Encounter (HOSPITAL_BASED_OUTPATIENT_CLINIC_OR_DEPARTMENT_OTHER): Payer: Medicare Other | Admitting: Internal Medicine

## 2021-04-20 NOTE — Progress Notes (Signed)
BONNY, VANLEEUWEN ([id number]) Visit Report for 04/14/2021 Allergy List Details Patient Name: Date of Service: [name] Riley, [name] A. 04/14/2021 7:30 A M Medical Record Number: [id number] Patient Account Number: [id number] Date of Birth/Sex: Treating RN: Apr 30, 1951 (70 y.o. Female) [name] Primary Care Tacori Kvamme: [name] Other Clinician: Referring Kiira Brach: Treating Cejay Cambre/Extender: Olena Heckle, Loleta Dicker Weeks in Treatment: 0 Allergies Active Allergies cantaloupe extract Iodinated Contrast Media pravastatin Zosyn latex Allergy Notes Electronic Signature(s) Signed: 04/14/2021 5:38:09 PM By: [name] Entered By: [name] on 04/14/2021 07:54:12 -------------------------------------------------------------------------------- Arrival Information Details Patient Name: Date of Service: [name] Riley, [name] 04/14/2021 7:30 A M Medical Record Number: [id number] Patient Account Number: [id number] Date of Birth/Sex: Treating RN: 01/07/1951 (70 y.o. Female) [name] Primary Care Tykwon Fera: [name] Other Clinician: Referring Almarosa Bohac: Treating Noreen Mackintosh/Extender: Dois Davenport in Treatment: 0 Visit Information Patient Arrived: Ambulatory Arrival Time: 07:51 Transfer Assistance: None Patient Identification Verified: Yes Secondary Verification Process Completed: Yes Patient Requires Transmission-Based Precautions: No Patient Has Alerts: No Electronic Signature(s) Signed: 04/14/2021 5:38:09 PM By: [name] Entered By: [name] on 04/14/2021 07:51:48 -------------------------------------------------------------------------------- Clinic Level of Care Assessment Details Patient Name: Date of Service: [name] Riley, [place]. 04/14/2021 7:30 A M Medical Record Number: [id number] Patient Account Number: [id number] Date of Birth/Sex: Treating RN: 14-Dec-1950 (70 y.o. Female) Baruch Gouty Primary Care Burlin Mcnair: [name]  Other Clinician: Referring Riley Papin: Treating Camdin Hegner/Extender: Lorenso Courier Weeks in Treatment: 0 Clinic Level of Care Assessment Items TOOL 1 Quantity Score []  - 0 Use when EandM and Procedure is performed on INITIAL visit ASSESSMENTS - Nursing Assessment / Reassessment X- 1 20 General Physical Exam (combine w/ comprehensive assessment (listed just below) when performed on new pt. evals) X- 1 25 Comprehensive Assessment (HX, ROS, Risk Assessments, Wounds Hx, etc.) ASSESSMENTS - Wound and Skin Assessment / Reassessment []  - 0 Dermatologic / Skin Assessment (not related to wound area) ASSESSMENTS - Ostomy and/or Continence Assessment and Care []  - 0 Incontinence Assessment and Management []  - 0 Ostomy Care Assessment and Management (repouching, etc.) PROCESS - Coordination of Care X - Simple Patient / Family Education for ongoing care 1 15 []  - 0 Complex (extensive) Patient / Family Education for ongoing care X- 1 10 Staff obtains Programmer, systems, Records, T Results / Process Orders est []  - 0 Staff telephones HHA, Nursing Homes / Clarify orders / etc []  - 0 Routine Transfer to another Facility (non-emergent condition) []  - 0 Routine Hospital Admission (non-emergent condition) X- 1 15 New Admissions / Biomedical engineer / Ordering NPWT Apligraf, etc. , []  - 0 Emergency Hospital Admission (emergent condition) PROCESS - Special Needs []  - 0 Pediatric / Minor Patient Management []  - 0 Isolation Patient Management []  - 0 Hearing / Language / Visual special needs []  - 0 Assessment of Community assistance (transportation, D/C planning, etc.) []  - 0 Additional assistance / Altered mentation []  - 0 Support Surface(s) Assessment (bed, cushion, seat, etc.) INTERVENTIONS - Miscellaneous []  - 0 External ear exam []  - 0 Patient Transfer (multiple staff / Civil Service fast streamer / Similar devices) []  - 0 Simple Staple / Suture removal (25 or less) []  - 0 Complex  Staple / Suture removal (26 or more) []  - 0 Hypo/Hyperglycemic Management (do not check if billed separately) X- 1 15 Ankle / Brachial Index (ABI) - do not check if billed separately Has the patient been seen at the hospital within the last three years: Yes Total Score:  100 Level Of Care: New/Established - Level 3 Electronic Signature(s) Signed: 04/14/2021 5:41:46 PM By: Baruch Gouty RN, BSN Signed: 04/14/2021 5:41:46 PM By: Baruch Gouty RN, BSN Entered By: Baruch Gouty on 04/14/2021 08:55:56 -------------------------------------------------------------------------------- Compression Therapy Details Patient Name: Date of Service: [name] Riley, [name] 04/14/2021 7:30 A M Medical Record Number: [id number] Patient Account Number: [id number] Date of Birth/Sex: Treating RN: 11/04/1950 (69 y.o. Female) Baruch Gouty Primary Care Cigi Bega: [name] Other Clinician: Referring Kolton Kienle: Treating Garland Smouse/Extender: Lorenso Courier Weeks in Treatment: 0 Compression Therapy Performed for Wound Assessment: Wound #1 Right,Anterior Lower Leg Performed By: Clinician Leane Call, RN Compression Type: Three Layer Post Procedure Diagnosis Same as Pre-procedure Electronic Signature(s) Signed: 04/14/2021 5:41:46 PM By: Baruch Gouty RN, BSN Entered By: Baruch Gouty on 04/14/2021 08:59:51 -------------------------------------------------------------------------------- Encounter Discharge Information Details Patient Name: Date of Service: [name] Riley, [name] 04/14/2021 7:30 A M Medical Record Number: [id number] Patient Account Number: [id number] Date of Birth/Sex: Treating RN: [birth year] (70 y.o. Female) Leane Call Primary Care Meri Pelot: [name] Other Clinician: Referring Katrina Daddona: Treating Markisha Meding/Extender: Lorenso Courier Weeks in Treatment: 0 Encounter Discharge Information Items Post Procedure Vitals Discharge Condition:  Stable Temperature (F): 98.3 Ambulatory Status: Ambulatory Pulse (bpm): 98 Discharge Destination: Home Respiratory Rate (breaths/min): 16 Transportation: Private Auto Blood Pressure (mmHg): 132/74 Accompanied By: alone Schedule Follow-up Appointment: No Clinical Summary of Care: Electronic Signature(s) Signed: 04/20/2021 6:17:43 PM By: Leane Call Entered By: Leane Call on 04/14/2021 17:00:35 -------------------------------------------------------------------------------- Lower Extremity Assessment Details Patient Name: Date of Service: [name] Riley, [name] A. 04/14/2021 7:30 A M Medical Record Number: [id number] Patient Account Number: [id number] Date of Birth/Sex: Treating RN: 04-20-1951 (70 y.o. Female) [name] Primary Care Vern Prestia: [name] Other Clinician: Referring Eswin Worrell: Treating Ketra Duchesne/Extender: Lorenso Courier Weeks in Treatment: 0 Edema Assessment Assessed: [Left: Yes] [Right: Yes] Edema: [Left: No] [Right: No] Calf Left: Right: Point of Measurement: 29 cm From Medial Instep 34.2 cm 34.6 cm Ankle Left: Right: Point of Measurement: 10 cm From Medial Instep 23 cm 22.5 cm Vascular Assessment Pulses: Dorsalis Pedis Palpable: [Left:Yes] [Right:Yes] Blood Pressure: Brachial: [Left:132] [Right:132] Ankle: [Left:Dorsalis Pedis: 158 1.20] [Right:Dorsalis Pedis: 180 1.36] Electronic Signature(s) Signed: 04/14/2021 5:38:09 PM By: [name] Entered By: [name] on 04/14/2021 08:10:53 -------------------------------------------------------------------------------- Multi-Disciplinary Care Plan Details Patient Name: Date of Service: [name] Riley, [name] 04/14/2021 7:30 A M Medical Record Number: [id number] Patient Account Number: [id number] Date of Birth/Sex: Treating RN: 08-Mar-1951 (70 y.o. Female) Baruch Gouty Primary Care Alani Sabbagh: [name] Other Clinician: Referring Tacarra Justo: Treating Zalma Channing/Extender: Dois Davenport in Treatment: 0 Multidisciplinary Care Plan reviewed with physician Active Inactive Abuse / Safety / Falls / Self Care Management Nursing Diagnoses: Potential for falls Goals: Patient/caregiver will verbalize/demonstrate measures taken to prevent injury and/or falls Date Initiated: 04/14/2021 Target Resolution Date: 05/12/2021 Goal Status: Active Interventions: Assess fall risk on admission and as needed Notes: Venous Leg Ulcer Nursing Diagnoses: Knowledge deficit related to disease process and management Potential for venous Insuffiency (use before diagnosis confirmed) Goals: Patient will maintain optimal edema control Date Initiated: 04/14/2021 Target Resolution Date: 05/12/2021 Goal Status: Active Interventions: Assess peripheral edema status every visit. Compression as ordered Provide education on venous insufficiency Treatment Activities: Therapeutic compression applied : 04/14/2021 Notes: Wound/Skin Impairment Nursing Diagnoses: Impaired tissue integrity Knowledge deficit related to ulceration/compromised skin integrity Goals: Patient/caregiver will verbalize understanding of skin care regimen Date Initiated: 04/14/2021 Target Resolution Date: 05/12/2021 Goal Status: Active  Ulcer/skin breakdown will have a volume reduction of 30% by week 4 Date Initiated: 04/14/2021 Target Resolution Date: 05/12/2021 Goal Status: Active Interventions: Assess patient/caregiver ability to obtain necessary supplies Assess patient/caregiver ability to perform ulcer/skin care regimen upon admission and as needed Assess ulceration(s) every visit Provide education on ulcer and skin care Treatment Activities: Skin care regimen initiated : 04/14/2021 Topical wound management initiated : 04/14/2021 Notes: Electronic Signature(s) Signed: 04/14/2021 5:41:46 PM By: Baruch Gouty RN, BSN Entered By: Baruch Gouty on 04/14/2021  08:53:22 -------------------------------------------------------------------------------- Pain Assessment Details Patient Name: Date of Service: [name] Riley, [place]. 04/14/2021 7:30 A M Medical Record Number: [id number] Patient Account Number: [id number] Date of Birth/Sex: Treating RN: 04/10/1951 (70 y.o. Female) [name] Primary Care Justinian Miano: [name] Other Clinician: Referring Andilyn Bettcher: Treating Vonzell Lindblad/Extender: Lorenso Courier Weeks in Treatment: 0 Active Problems Location of Pain Severity and Description of Pain Patient Has Paino Yes Site Locations Pain Location: Pain Location: Pain in Ulcers With Dressing Change: Yes Rate the pain. Current Pain Level: 3 Character of Pain Describe the Pain: Aching, Burning Pain Management and Medication Current Pain Management: Medication: Yes Cold Application: No Rest: Yes Massage: No Activity: No T.E.N.S.: No Heat Application: No Leg drop or elevation: No Is the Current Pain Management Adequate: Inadequate How does your wound impact your activities of daily livingo Sleep: No Bathing: No Appetite: No Relationship With Others: No Bladder Continence: No Emotions: No Bowel Continence: No Work: No Toileting: No Drive: No Dressing: No Hobbies: No Electronic Signature(s) Signed: 04/14/2021 5:38:09 PM By: [name] Entered By: [name] on 04/14/2021 08:15:33 -------------------------------------------------------------------------------- Patient/Caregiver Education Details Patient Name: Date of Service: [name] Merlin A. 6/29/2022andnbsp7:30 A M Medical Record Number: [id number] Patient Account Number: [id number] Date of Birth/Gender: Treating RN: 03-06-1951 (70 y.o. Female) Baruch Gouty Primary Care Physician: [name] Other Clinician: Referring Physician: Treating Physician/Extender: Dois Davenport in Treatment: 0 Education Assessment Education  Provided To: Patient Education Topics Provided Venous: Handouts: Controlling Swelling with Multilayered Compression Wraps Methods: Explain/Verbal, Printed Responses: Reinforcements needed, State content correctly [place]: o Handouts: Welcome T [facility] o Methods: Explain/Verbal, Printed Responses: Reinforcements needed, State content correctly Wound/Skin Impairment: Electronic Signature(s) Signed: 04/14/2021 5:41:46 PM By: Baruch Gouty RN, BSN Entered By: Baruch Gouty on 04/14/2021 08:54:51 -------------------------------------------------------------------------------- Wound Assessment Details Patient Name: Date of Service: [name] Riley, [name] 04/14/2021 7:30 A M Medical Record Number: [id number] Patient Account Number: [id number] Date of Birth/Sex: Treating RN: 15-Jun-1951 (70 y.o. Female) [name] Primary Care Miranda Garber: [name] Other Clinician: Referring Paul Torpey: Treating Usama Harkless/Extender: Lorenso Courier Weeks in Treatment: 0 Wound Status Wound Number: 1 Primary Trauma, Other Etiology: Wound Location: Right, Anterior Lower Leg Wound Open Wounding Event: Trauma Status: Date Acquired: 03/17/2021 Comorbid Coronary Artery Disease, Hypertension, Myocardial Infarction, Weeks Of Treatment: 0 History: Neuropathy, Received Chemotherapy, Received Radiation Clustered Wound: No Photos Wound Measurements Length: (cm) 1.4 Width: (cm) 1.5 Depth: (cm) 0.1 Area: (cm) 1.649 Volume: (cm) 0.165 % Reduction in Area: 0% % Reduction in Volume: 0% Epithelialization: None Tunneling: No Undermining: No Wound Description Classification: Full Thickness Without Exposed Support Structures Wound Margin: Distinct, outline attached Exudate Amount: Medium Exudate Type: Serosanguineous Exudate Color: red, brown Foul Odor After Cleansing: No Slough/Fibrino Yes Wound Bed Granulation Amount: Large (67-100%) Exposed  Structure Granulation Quality: Red, Pink Fascia Exposed: No Necrotic Amount: Small (1-33%) Fat Layer (Subcutaneous Tissue) Exposed: Yes Necrotic Quality: Adherent Slough Tendon  Exposed: No Muscle Exposed: No Joint Exposed: No Bone Exposed: No Treatment Notes Wound #1 (Lower Leg) Wound Laterality: Right, Anterior Cleanser Peri-Wound Care Sween Lotion (Moisturizing lotion) Discharge Instruction: Apply moisturizing lotion as directed Topical Primary Dressing Hydrofera Blue Classic Foam, 2x2 in Discharge Instruction: Moisten with saline prior to applying to wound bed Secondary Dressing Woven Gauze Sponge, Non-Sterile 4x4 in Discharge Instruction: Apply over primary dressing as directed. Secured With Compression Wrap ThreePress (3 layer compression wrap) Discharge Instruction: Apply three layer compression as directed. Compression Stockings Add-Ons Electronic Signature(s) Signed: 04/14/2021 5:02:46 PM By: Sandre Kitty Signed: 04/14/2021 5:38:09 PM By: [name] Entered By: Sandre Kitty on 04/14/2021 16:48:42 -------------------------------------------------------------------------------- Vitals Details Patient Name: Date of Service: [name] Riley, [name] 04/14/2021 7:30 A M Medical Record Number: [id number] Patient Account Number: [id number] Date of Birth/Sex: Treating RN: Feb 28, 1951 (70 y.o. Female) [name] Primary Care Kaidyn Hernandes: [name] Other Clinician: Referring Mykal Kirchman: Treating Christeen Lai/Extender: Lorenso Courier Weeks in Treatment: 0 Vital Signs Time Taken: 07:52 Temperature (F): 98.3 Height (in): 68 Pulse (bpm): 98 Source: Stated Respiratory Rate (breaths/min): 16 Weight (lbs): 157 Blood Pressure (mmHg): 132/74 Source: Stated Reference Range: 80 - 120 mg / dl Body Mass Index (BMI): 23.9 Electronic Signature(s) Signed: 04/14/2021 5:38:09 PM By: [name] Entered By: [name] on 04/14/2021 07:52:26

## 2021-04-21 ENCOUNTER — Encounter (HOSPITAL_BASED_OUTPATIENT_CLINIC_OR_DEPARTMENT_OTHER): Payer: Medicare Other | Attending: Physician Assistant | Admitting: Physician Assistant

## 2021-04-21 ENCOUNTER — Other Ambulatory Visit: Payer: Self-pay

## 2021-04-21 DIAGNOSIS — X58XXXA Exposure to other specified factors, initial encounter: Secondary | ICD-10-CM | POA: Diagnosis not present

## 2021-04-21 DIAGNOSIS — L97812 Non-pressure chronic ulcer of other part of right lower leg with fat layer exposed: Secondary | ICD-10-CM | POA: Diagnosis not present

## 2021-04-21 DIAGNOSIS — G9009 Other idiopathic peripheral autonomic neuropathy: Secondary | ICD-10-CM | POA: Diagnosis not present

## 2021-04-21 DIAGNOSIS — C7981 Secondary malignant neoplasm of breast: Secondary | ICD-10-CM | POA: Diagnosis not present

## 2021-04-21 DIAGNOSIS — I2510 Atherosclerotic heart disease of native coronary artery without angina pectoris: Secondary | ICD-10-CM | POA: Diagnosis not present

## 2021-04-21 DIAGNOSIS — I252 Old myocardial infarction: Secondary | ICD-10-CM | POA: Insufficient documentation

## 2021-04-21 DIAGNOSIS — I10 Essential (primary) hypertension: Secondary | ICD-10-CM | POA: Diagnosis not present

## 2021-04-21 DIAGNOSIS — R609 Edema, unspecified: Secondary | ICD-10-CM | POA: Diagnosis not present

## 2021-04-21 DIAGNOSIS — I872 Venous insufficiency (chronic) (peripheral): Secondary | ICD-10-CM | POA: Insufficient documentation

## 2021-04-21 DIAGNOSIS — S80811A Abrasion, right lower leg, initial encounter: Secondary | ICD-10-CM | POA: Diagnosis not present

## 2021-04-21 NOTE — Progress Notes (Signed)
[name], Riley ([id number]) Visit Report for 04/21/2021 Arrival Information Details Patient Name: Date of Service: [name], [name] 04/21/2021 8:30 A M Medical Record Number: [id number] Patient Account Number: [id number] Date of Birth/Sex: Treating RN: January 12, 1951 (70 y.o. [name] Riley Primary Care [name] Riley: [name] Riley [name] Riley: Referring [name] Riley: Treating [name] Riley/Extender: [name] Riley in Treatment: 1 Visit Information History Since Last Visit Added or deleted any medications: No Patient Arrived: Ambulatory Any new allergies or adverse reactions: No Arrival Time: 08:38 Had a fall or experienced change in No Transfer Assistance: None activities of daily living that may affect Patient Identification Verified: Yes risk of falls: Secondary Verification Process Completed: Yes Signs or symptoms of abuse/neglect since last visito No Patient Requires Transmission-Based Precautions: No Hospitalized since last visit: No Patient Has Alerts: No Implantable device outside of the clinic excluding No cellular tissue based products placed in the center since last visit: Has Dressing in Place as Prescribed: Yes Has Compression in Place as Prescribed: Yes Pain Present Now: No Electronic Signature(s) Signed: 04/21/2021 5:24:26 PM By: [name] Entered By: [name] on 04/21/2021 08:41:15 -------------------------------------------------------------------------------- Compression Therapy Details Patient Name: Date of Service: [name], [name] 04/21/2021 8:30 A M Medical Record Number: [id number] Patient Account Number: [id number] Date of Birth/Sex: Treating RN: 06-24-1951 (70 y.o. [name] Riley Primary Care [name] Riley: [name] Riley [name] Riley: Referring [name] Riley: Treating [name] Riley/Extender: [name] Riley in Treatment: 1 Compression Therapy Performed for Wound Assessment: Wound #1 Right,Anterior Lower  Leg Performed By: Riley Leane Call, RN Compression Type: Three Layer Post Procedure Diagnosis Same as Pre-procedure Electronic Signature(s) Signed: 04/21/2021 6:17:15 PM By: Baruch Gouty RN, BSN Entered By: Baruch Gouty on 04/21/2021 09:32:10 -------------------------------------------------------------------------------- Encounter Discharge Information Details Patient Name: Date of Service: [name], [name] 04/21/2021 8:30 A M Medical Record Number: [id number] Patient Account Number: [id number] Date of Birth/Sex: Treating RN: 17-Mar-1951 (70 y.o. Nita Sells Primary Care Raman Featherston: [name] Riley [name] Riley: Referring [name] Riley: Treating [name] Riley/Extender: [name] Riley in Treatment: 1 Encounter Discharge Information Items Post Procedure Vitals Discharge Condition: Stable Temperature (F): 98.7 Ambulatory Status: Ambulatory Pulse (bpm): 76 Discharge Destination: Home Respiratory Rate (breaths/min): 16 Transportation: Private Auto Blood Pressure (mmHg): 128/76 Accompanied By: alone Schedule Follow-up Appointment: No Clinical Summary of Care: Patient Declined Electronic Signature(s) Signed: 04/21/2021 5:07:06 PM By: Leane Call Entered By: Leane Call on 04/21/2021 17:00:13 -------------------------------------------------------------------------------- Lower Extremity Assessment Details Patient Name: Date of Service: [name], [name] 04/21/2021 8:30 A M Medical Record Number: [id number] Patient Account Number: [id number] Date of Birth/Sex: Treating RN: 15-Mar-1951 (70 y.o. [name] Riley Primary Care [name] Riley: [name] Riley [name] Riley: Referring Krystle Oberman: Treating [name] Riley/Extender: [name] Riley in Treatment: 1 Edema Assessment Assessed: [Left: Yes] [Right: No] Edema: [Left: N] [Right: o] Calf Left: Right: Point of Measurement: 29 cm From Medial Instep 34 cm Ankle Left:  Right: Point of Measurement: 10 cm From Medial Instep 2 cm Vascular Assessment Pulses: Dorsalis Pedis Palpable: [Left:Yes] Electronic Signature(s) Signed: 04/21/2021 5:24:26 PM By: [name] Entered By: [name] on 04/21/2021 08:52:43 -------------------------------------------------------------------------------- Multi-Disciplinary Care Plan Details Patient Name: Date of Service: [name], [name] 04/21/2021 8:30 A M Medical Record Number: [id number] Patient Account Number: [id number] Date of Birth/Sex: Treating RN: 1950-11-20 (70 y.o. [name] Riley Primary Care Shavelle Runkel: [name] Riley [name] Riley: Referring Jailani Hogans: Treating [name] Riley/Extender: [name] Riley in Treatment: 1 Multidisciplinary Care Plan reviewed  with physician Active Inactive Abuse / Safety / Falls / Self Care Management Nursing Diagnoses: Potential for falls Goals: Patient/caregiver will verbalize/demonstrate measures taken to prevent injury and/or falls Date Initiated: 04/14/2021 Target Resolution Date: 05/12/2021 Goal Status: Active Interventions: Assess fall risk on admission and as needed Notes: Venous Leg Ulcer Nursing Diagnoses: Knowledge deficit related to disease process and management Potential for venous Insuffiency (use before diagnosis confirmed) Goals: Patient will maintain optimal edema control Date Initiated: 04/14/2021 Target Resolution Date: 05/12/2021 Goal Status: Active Interventions: Assess peripheral edema status every visit. Compression as ordered Provide education on venous insufficiency Treatment Activities: Therapeutic compression applied : 04/14/2021 Notes: Wound/Skin Impairment Nursing Diagnoses: Impaired tissue integrity Knowledge deficit related to ulceration/compromised skin integrity Goals: Patient/caregiver will verbalize understanding of skin care regimen Date Initiated: 04/14/2021 Target Resolution Date: 05/12/2021 Goal  Status: Active Ulcer/skin breakdown will have a volume reduction of 30% by week 4 Date Initiated: 04/14/2021 Target Resolution Date: 05/12/2021 Goal Status: Active Interventions: Assess patient/caregiver ability to obtain necessary supplies Assess patient/caregiver ability to perform ulcer/skin care regimen upon admission and as needed Assess ulceration(s) every visit Provide education on ulcer and skin care Treatment Activities: Skin care regimen initiated : 04/14/2021 Topical wound management initiated : 04/14/2021 Notes: Electronic Signature(s) Signed: 04/21/2021 6:17:15 PM By: Baruch Gouty RN, BSN Entered By: Baruch Gouty on 04/21/2021 09:30:35 -------------------------------------------------------------------------------- Pain Assessment Details Patient Name: Date of Service: Maish, Braxton. 04/21/2021 8:30 A M Medical Record Number: [id number] Patient Account Number: [id number] Date of Birth/Sex: Treating RN: October 05, 1951 (70 y.o. [name] Riley Primary Care Jalin Alicea: [name] Riley [name] Riley: Referring Tsion Inghram: Treating Keera Altidor/Extender: [name] Riley in Treatment: 1 Active Problems Location of Pain Severity and Description of Pain Patient Has Paino No Site Locations Pain Management and Medication Current Pain Management: Electronic Signature(s) Signed: 04/21/2021 5:24:26 PM By: [name] Entered By: [name] on 04/21/2021 08:42:21 -------------------------------------------------------------------------------- Patient/Caregiver Education Details Patient Name: Date of Service: [place], [name] 7/6/2022andnbsp8:30 A M Medical Record Number: [id number] Patient Account Number: [id number] Date of Birth/Gender: Treating RN: 11-19-1950 (70 y.o. [name] Riley Primary Care Physician: [name] Riley [name] Riley: Referring Physician: Treating Physician/Extender: [name] Riley in Treatment:  1 Education Assessment Education Provided To: Patient Education Topics Provided Venous: Methods: Explain/Verbal Responses: Reinforcements needed, State content correctly Electronic Signature(s) Signed: 04/21/2021 6:17:15 PM By: Baruch Gouty RN, BSN Entered By: Baruch Gouty on 04/21/2021 09:30:52 -------------------------------------------------------------------------------- Wound Assessment Details Patient Name: Date of Service: [place], [name] 04/21/2021 8:30 A M Medical Record Number: [id number] Patient Account Number: [id number] Date of Birth/Sex: Treating RN: 1951/01/05 (70 y.o. [name] Riley Primary Care Delesa Kawa: [name] Riley [name] Riley: Referring Zilah Villaflor: Treating Makyra Corprew/Extender: [name] Riley in Treatment: 1 Wound Status Wound Number: 1 Primary Trauma, [name] Etiology: Wound Location: Right, Anterior Lower Leg Wound Open Wounding Event: Trauma Status: Date Acquired: 03/17/2021 Comorbid Coronary Artery Disease, Hypertension, Myocardial Infarction, Weeks Of Treatment: 1 History: Neuropathy, Received Chemotherapy, Received Radiation Clustered Wound: No Photos Wound Measurements Length: (cm) 1.4 Width: (cm) 1.5 Depth: (cm) 0.2 Area: (cm) 1.649 Volume: (cm) 0.33 % Reduction in Area: 0% % Reduction in Volume: -100% Epithelialization: None Tunneling: No Undermining: No Wound Description Classification: Full Thickness Without Exposed Support Structures Wound Margin: Distinct, outline attached Exudate Amount: Medium Exudate Type: Purulent Exudate Color: yellow, brown, green Foul Odor After Cleansing: No Slough/Fibrino Yes Wound Bed Granulation Amount: Medium (34-66%) Exposed Structure Granulation Quality: Red, Pink Fascia  Exposed: No Necrotic Amount: Medium (34-66%) Fat Layer (Subcutaneous Tissue) Exposed: Yes Necrotic Quality: Adherent Slough Tendon Exposed: No Muscle Exposed: No Joint Exposed: No Bone  Exposed: No Treatment Notes Wound #1 (Lower Leg) Wound Laterality: Right, Anterior Cleanser Peri-Wound Care Sween Lotion (Moisturizing lotion) Discharge Instruction: Apply moisturizing lotion to lower leg Topical Primary Dressing Hydrofera Blue Classic Foam, 2x2 in Discharge Instruction: Moisten with saline prior to applying to wound bed Secondary Dressing Woven Gauze Sponge, Non-Sterile 4x4 in Discharge Instruction: Apply over primary dressing as directed. Secured With Compression Wrap ThreePress (3 layer compression wrap) Discharge Instruction: Apply three layer compression as directed. Compression Stockings Add-Ons Electronic Signature(s) Signed: 04/21/2021 5:22:20 PM By: Sandre Kitty Signed: 04/21/2021 5:24:26 PM By: [name] Entered By: Sandre Kitty on 04/21/2021 17:13:58 -------------------------------------------------------------------------------- Vitals Details Patient Name: Date of Service: [place], [name] 04/21/2021 8:30 A M Medical Record Number: [id number] Patient Account Number: [id number] Date of Birth/Sex: Treating RN: 1951/01/06 (70 y.o. [name] Riley Primary Care Marchel Foote: [name] Riley [name] Riley: Referring Myrtle Haller: Treating Shizuko Wojdyla/Extender: [name] Riley in Treatment: 1 Vital Signs Time Taken: 08:41 Temperature (F): 98.7 Height (in): 68 Pulse (bpm): 76 Weight (lbs): 157 Respiratory Rate (breaths/min): 16 Body Mass Index (BMI): 23.9 Blood Pressure (mmHg): 128/66 Reference Range: 80 - 120 mg / dl Electronic Signature(s) Signed: 04/21/2021 5:24:26 PM By: [name] Entered By: [name] on 04/21/2021 08:42:12

## 2021-04-21 NOTE — Progress Notes (Addendum)
[name] Riley, [name] Riley ([id number]) Visit Report for 04/21/2021 Chief Complaint Document Details Patient Name: Date of Service: [name] Riley, [name] A. 04/21/2021 8:30 A M Medical Record Number: [id number] Patient Account Number: [id number] Date of Birth/Sex: Treating RN: January 28, 1951 (70 y.o. [name] Primary Care Provider: [name] Other Clinician: Referring Provider: Treating Provider/Extender: Vickii Penna in Treatment: 1 Information Obtained from: Patient Chief Complaint Right LE Ulcer Electronic Signature(s) Signed: 04/21/2021 8:42:14 AM By: Worthy Keeler PA-C Entered By: Worthy Keeler on 04/21/2021 08:42:13 -------------------------------------------------------------------------------- Debridement Details Patient Name: Date of Service: [name], [name] 04/21/2021 8:30 A M Medical Record Number: [id number] Patient Account Number: [id number] Date of Birth/Sex: Treating RN: October 20, 1950 (70 y.o. [name] Primary Care Provider: [name] Other Clinician: Referring Provider: Treating Provider/Extender: Vickii Penna in Treatment: 1 Debridement Performed for Assessment: Wound #1 Right,Anterior Lower Leg Performed By: Physician Worthy Keeler, PA Debridement Type: Debridement Level of Consciousness (Pre-procedure): Awake and Alert Pre-procedure Verification/Time Out Yes - 09:30 Taken: Start Time: 09:32 Pain Control: Other : benzocaine 20% spray T Area Debrided (L x W): otal 1.4 (cm) x 1.5 (cm) = 2.1 (cm) Tissue and other material debrided: Viable, Non-Viable, Slough, Subcutaneous, Slough Level: Skin/Subcutaneous Tissue Debridement Description: Excisional Instrument: Curette Bleeding: Minimum Hemostasis Achieved: Pressure End Time: 09:33 Procedural Pain: 4 Post Procedural Pain: 3 Response to Treatment: Procedure was tolerated well Level of Consciousness (Post- Awake and Alert procedure): Post Debridement  Measurements of Total Wound Length: (cm) 1.4 Width: (cm) 1.5 Depth: (cm) 0.2 Volume: (cm) 0.33 Character of Wound/Ulcer Post Debridement: Improved Post Procedure Diagnosis Same as Pre-procedure Electronic Signature(s) Signed: 04/21/2021 4:43:01 PM By: Worthy Keeler PA-C Signed: 04/21/2021 6:17:15 PM By: Baruch Gouty RN, BSN Entered By: Baruch Gouty on 04/21/2021 09:33:18 -------------------------------------------------------------------------------- HPI Details Patient Name: Date of Service: [name] Riley, [name] 04/21/2021 8:30 A M Medical Record Number: [id number] Patient Account Number: [id number] Date of Birth/Sex: Treating RN: Jul 17, 1951 (70 y.o. [name] Primary Care Provider: [name] Other Clinician: Referring Provider: Treating Provider/Extender: Vickii Penna in Treatment: 1 History of Present Illness HPI Description: 04/14/2021 upon evaluation today patient presents for initial inspection here in the clinic concerning issues that she has been having with a wound on her right anterior lower leg after she had an abrasion trauma 4 weeks ago. She tells me that unfortunately following this trauma she has been experiencing quite a bit of discomfort at times with some burning and stinging. She is also had a scab up once or twice but states that right now she has been unable to really get it to scab back over. This has been a frustration for her and she is not really able to get it to heal as quickly and effectively as she would like to see. The patient also tells me that she has been tolerating the dressing changes without complication. She has just been using over-the-counter antibiotic ointment and try to keep it covered with a dry dressing at times depending on what she was told to do. Nonetheless right now she is also been on Keflex that did not really seem to help she was switched over to Augmentin she does started that on Friday. Currently  I do not see anything that seems to be obviously infected but nonetheless I think it is fine for her to take the Augmentin just to make sure that we do not get anything that  initiates in the interim. The patient is currently undergoing chemotherapy she has had previous radiation. Nonetheless in regard to the chemotherapy this could be affecting her ability to heal to some degree. 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 She has hypertension, neuropathy associated with the chemotherapy, and a history of heart disease. She is currently again undergoing active chemotherapy for recurrent breast cancer. 04/21/2021 upon evaluation today patient appears to be doing well with regard to her leg ulcer. This is actually looking significantly improved. There does not appear to be any signs of infection visually and I am happy in that regard. Fortunately there is no signs of systemic infection either which is also great news. No fevers, chills, nausea, vomiting, or diarrhea. Electronic Signature(s) Signed: 04/21/2021 9:36:52 AM By: Worthy Keeler PA-C Entered By: Worthy Keeler on 04/21/2021 09:36:52 -------------------------------------------------------------------------------- Physical Exam Details Patient Name: Date of Service: [name] Riley, [name] 04/21/2021 8:30 A M Medical Record Number: [id number] Patient Account Number: [id number] Date of Birth/Sex: Treating RN: 07-Jun-1951 (70 y.o. [name] Primary Care Provider: [name] Other Clinician: Referring Provider: Treating Provider/Extender: Vickii Penna in Treatment: 1 Constitutional Well-nourished and well-hydrated in no acute distress. Respiratory normal breathing without difficulty. Psychiatric this patient is able to make decisions and demonstrates good insight into disease process. Alert and Oriented  x 3. pleasant and cooperative. Notes Patient's wound bed showed signs of good granulation epithelization at this point. There does not appear to be any evidence of infection which is great news and overall very pleased with where things stand today. I think the patient is headed in the right direction I think the [facility] is doing a great job as well. Electronic Signature(s) Signed: 04/21/2021 9:37:10 AM By: Worthy Keeler PA-C Entered By: Worthy Keeler on 04/21/2021 09:37:09 -------------------------------------------------------------------------------- Physician Orders Details Patient Name: Date of Service: [name] Riley, [name] 04/21/2021 8:30 A M Medical Record Number: [id number] Patient Account Number: [id number] Date of Birth/Sex: Treating RN: Nov 23, 1950 (70 y.o. 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 Bathing/ Shower/ Hygiene May shower with protection but do not get wound dressing(s) wet. Edema Control - Lymphedema / SCD / Other Right Lower Extremity Elevate legs to the level of the heart or above for 30 minutes daily and/or when sitting, a frequency of: - 3-4 times per day Avoid standing for long periods of time. Exercise regularly Wound  Treatment Wound #1 - Lower Leg Wound Laterality: Right, Anterior Peri-Wound Care: Sween Lotion (Moisturizing lotion) 1 x Per Week/30 Days Discharge Instructions: Apply moisturizing lotion to lower leg Prim Dressing: Hydrofera Blue Classic Foam, 2x2 in 1 x Per Week/30 Days ary Discharge Instructions: Moisten with saline prior to applying to wound bed Secondary Dressing: Woven Gauze Sponge, Non-Sterile 4x4 in 1 x Per Week/30 Days Discharge Instructions: Apply over primary  dressing as directed. Compression Wrap: ThreePress (3 layer compression wrap) 1 x Per Week/30 Days Discharge Instructions: Apply three layer compression as directed. Electronic Signature(s) Signed: 04/21/2021 4:43:01 PM By: Worthy Keeler PA-C Signed: 04/21/2021 6:17:15 PM By: Baruch Gouty RN, BSN Entered By: Baruch Gouty on 04/21/2021 09:34:07 -------------------------------------------------------------------------------- Problem List Details Patient Name: Date of Service: [name] Riley, [name] 04/21/2021 8:30 A M Medical Record Number: [id number] Patient Account Number: [id number] Date of Birth/Sex: Treating RN: 09/28/1951 (70 y.o. 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 04/14/2021 No Yes C79.81 Secondary malignant neoplasm of breast 04/14/2021 No Yes I10 Essential (primary) hypertension 04/14/2021 No Yes I25.10 Atherosclerotic heart disease of native coronary artery without angina pectoris 04/14/2021 No  Yes Z92.21 Personal history of antineoplastic chemotherapy 04/14/2021 No Yes Inactive Problems Resolved Problems Electronic Signature(s) Signed: 04/21/2021 8:42:08 AM By: Worthy Keeler PA-C Entered By: Worthy Keeler on 04/21/2021 08:42:08 -------------------------------------------------------------------------------- Progress Note Details Patient Name: Date of Service: [name] Riley, [name] 04/21/2021 8:30 A M Medical Record Number: [id number] Patient Account Number: [id number] Date of Birth/Sex: Treating RN: [birth year] (70 y.o. [name] Primary Care Provider: Other Clinician: [name] Referring Provider: Treating Provider/Extender: Vickii Penna in Treatment: 1 Subjective Chief Complaint Information obtained from Patient Right LE Ulcer History of Present Illness (HPI) 04/14/2021 upon evaluation today patient presents for initial inspection here in the clinic concerning issues that she has been having with a wound on her right anterior lower leg after she had an abrasion trauma 4 weeks ago. She tells me that unfortunately following this trauma she has been experiencing quite a bit of discomfort at times with some burning and stinging. She is also had a scab up once or twice but states that right now she has been unable to really get it to scab back over. This has been a frustration for her and she is not really able to get it to heal as quickly and effectively as she would like to see. The patient also tells me that she has been tolerating the dressing changes without complication. She has just been using over-the-counter antibiotic ointment and try to keep it covered with a dry dressing at times depending on what she was told to do. Nonetheless right now she is also been on Keflex that did not really seem to help she was switched over to Augmentin she does started that on Friday. Currently I do not see anything that seems to be obviously infected but  nonetheless I think it is fine for her to take the Augmentin just to make sure that we do not get anything that initiates in the interim. The patient is currently undergoing chemotherapy she has had previous radiation. Nonetheless in regard to the chemotherapy this could be affecting her ability to heal to some degree. 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 She has hypertension, neuropathy associated with the chemotherapy, and a history of heart disease. She is currently again undergoing active chemotherapy for  recurrent breast cancer. 04/21/2021 upon evaluation today patient appears to be doing well with regard to her leg ulcer. This is actually looking significantly improved. There does not appear to be any signs of infection visually and I am happy in that regard. Fortunately there is no signs of systemic infection either which is also great news. No fevers, chills, nausea, vomiting, or diarrhea. Objective Constitutional Well-nourished and well-hydrated in no acute distress. Vitals Time Taken: 8:41 AM, Height: 68 in, Weight: 157 lbs, BMI: 23.9, Temperature: 98.7 F, Pulse: 76 bpm, Respiratory Rate: 16 breaths/min, Blood Pressure: 128/66 mmHg. Respiratory normal breathing without difficulty. Psychiatric this patient is able to make decisions and demonstrates good insight into disease process. Alert and Oriented x 3. pleasant and cooperative. General Notes: Patient's wound bed showed signs of good granulation epithelization at this point. There does not appear to be any evidence of infection which is great news and overall very pleased with where things stand today. I think the patient is headed in the right direction I think the [facility] is doing a great job as well. Integumentary (Hair, Skin) Wound #1 status is Open. Original cause of wound was Trauma. The date acquired was:  03/17/2021. The wound has been in treatment 1 weeks. The wound is located on the Right,Anterior Lower Leg. The wound measures 1.4cm length x 1.5cm width x 0.2cm depth; 1.649cm^2 area and 0.33cm^3 volume. There is Fat Layer (Subcutaneous Tissue) exposed. There is no tunneling or undermining noted. There is a medium amount of purulent drainage noted. The wound margin is distinct with the outline attached to the wound base. There is medium (34-66%) red, pink granulation within the wound bed. There is a medium (34-66%) amount of necrotic tissue within the wound bed including Adherent Slough. 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 There was a Excisional Skin/Subcutaneous Tissue Debridement with a total area of 2.1 sq cm performed by Worthy Keeler, PA. With the following instrument(s): Curette to remove Viable and Non-Viable tissue/material. Material removed includes Subcutaneous Tissue and Slough and after achieving pain control using Other (benzocaine 20% spray). No specimens were taken. A time out was conducted at 09:30, prior to the start of the procedure. A Minimum amount of bleeding was controlled with Pressure. The procedure was tolerated well with a pain level of 4 throughout and a pain level of 3 following the procedure. Post Debridement Measurements: 1.4cm length x 1.5cm width x 0.2cm depth; 0.33cm^3 volume. Character of Wound/Ulcer Post Debridement is  improved. Post procedure Diagnosis Wound #1: Same as Pre-Procedure Pre-procedure diagnosis of Wound #1 is a Trauma, Other located on the Right,Anterior Lower Leg . There was a Three Layer Compression Therapy Procedure by Leane Call, RN. Post procedure Diagnosis Wound #1: Same as Pre-Procedure Plan Follow-up Appointments: Return Appointment in 1 week. Bathing/ Shower/ Hygiene: May shower with protection but do not get wound dressing(s) wet. Edema Control - Lymphedema / SCD / Other: Elevate legs to the level of the heart or above for 30 minutes daily and/or when sitting, a frequency of: - 3-4 times per day Avoid standing for long periods  of time. Exercise regularly WOUND #1: - Lower Leg Wound Laterality: Right, Anterior Peri-Wound Care: Sween Lotion (Moisturizing lotion) 1 x Per Week/30 Days Discharge Instructions: Apply moisturizing lotion to lower leg Prim Dressing: Hydrofera Blue Classic Foam, 2x2 in 1 x Per Week/30 Days ary Discharge Instructions: Moisten with saline prior to applying to wound bed Secondary Dressing: Woven Gauze Sponge, Non-Sterile 4x4 in 1 x Per Week/30 Days Discharge Instructions: Apply over primary dressing as directed. Com pression Wrap: ThreePress (3 layer compression wrap) 1 x Per Week/30 Days Discharge Instructions: Apply three layer compression as directed. 1. Would recommend currently that we going continue with the wound care measures as before and the patient is in agreement with the plan. This includes the use of the [facility] dressing which I think has done excellent for her. 2. I am also can recommend that we have the patient continue with a 3 layer compression wrap which I think is done a great job. 3. I would also recommend she continue to elevate her legs much as possible to help with edema control. We will see patient back for reevaluation in 1 week here in the clinic. If anything worsens or changes patient will contact our office for  additional recommendations. Electronic Signature(s) Signed: 04/21/2021 9:37:48 AM By: Worthy Keeler PA-C Entered By: Worthy Keeler on 04/21/2021 09:37:48 -------------------------------------------------------------------------------- SuperBill Details Patient Name: Date of Service: [name] Riley, [name] 04/21/2021 Medical Record Number: [id number] Patient Account Number: [id number] Date of Birth/Sex: Treating RN: July 16, 1951 (71 y.o. 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 C79.81 Secondary malignant neoplasm of breast I10 Essential (primary) hypertension I25.10 Atherosclerotic heart disease of native coronary artery without angina pectoris Z92.21 Personal history of antineoplastic chemotherapy Facility Procedures CPT4 Code: [id number] Description: 03500 - DEB SUBQ TISSUE 20 SQ CM/< ICD-10 Diagnosis Description L97.812 Non-pressure chronic ulcer of other part of right lower leg with fat layer exp Modifier: osed Quantity: 1 Physician Procedures : CPT4 Code Description Modifier [id number] - WC PHYS SUBQ TISS 20 SQ CM ICD-10 Diagnosis Description R67.893 Non-pressure chronic ulcer of other part of right lower leg with fat layer exposed Quantity: 1 Electronic Signature(s) Signed: 04/21/2021 9:37:59 AM By: Worthy Keeler PA-C Entered By: Worthy Keeler on 04/21/2021 09:37:58

## 2021-04-23 ENCOUNTER — Other Ambulatory Visit: Payer: Self-pay | Admitting: Family Medicine

## 2021-04-23 ENCOUNTER — Other Ambulatory Visit (HOSPITAL_COMMUNITY): Payer: Medicare Other

## 2021-04-23 DIAGNOSIS — C50919 Malignant neoplasm of unspecified site of unspecified female breast: Secondary | ICD-10-CM

## 2021-04-23 DIAGNOSIS — I10 Essential (primary) hypertension: Secondary | ICD-10-CM

## 2021-04-25 ENCOUNTER — Other Ambulatory Visit: Payer: Self-pay | Admitting: Family Medicine

## 2021-04-25 DIAGNOSIS — I10 Essential (primary) hypertension: Secondary | ICD-10-CM

## 2021-04-25 DIAGNOSIS — C50919 Malignant neoplasm of unspecified site of unspecified female breast: Secondary | ICD-10-CM

## 2021-04-26 ENCOUNTER — Other Ambulatory Visit (HOSPITAL_COMMUNITY): Payer: Self-pay

## 2021-04-26 ENCOUNTER — Other Ambulatory Visit: Payer: Self-pay

## 2021-04-26 ENCOUNTER — Encounter: Payer: Self-pay | Admitting: Family Medicine

## 2021-04-26 ENCOUNTER — Ambulatory Visit (INDEPENDENT_AMBULATORY_CARE_PROVIDER_SITE_OTHER): Payer: Medicare Other | Admitting: Family Medicine

## 2021-04-26 ENCOUNTER — Other Ambulatory Visit: Payer: Self-pay | Admitting: Hematology and Oncology

## 2021-04-26 VITALS — BP 108/67 | HR 129 | Temp 97.4°F | Ht 68.0 in | Wt 153.0 lb

## 2021-04-26 DIAGNOSIS — N3001 Acute cystitis with hematuria: Secondary | ICD-10-CM | POA: Diagnosis not present

## 2021-04-26 LAB — URINALYSIS
Bilirubin, UA: NEGATIVE
Glucose, UA: NEGATIVE
Nitrite, UA: POSITIVE — AB
Specific Gravity, UA: 1.02 (ref 1.005–1.030)
Urobilinogen, Ur: 4 mg/dL — ABNORMAL HIGH (ref 0.2–1.0)
pH, UA: 5.5 (ref 5.0–7.5)

## 2021-04-26 MED ORDER — NITROFURANTOIN MONOHYD MACRO 100 MG PO CAPS
100.0000 mg | ORAL_CAPSULE | Freq: Two times a day (BID) | ORAL | 0 refills | Status: DC
Start: 2021-04-26 — End: 2021-05-03

## 2021-04-26 NOTE — Patient Instructions (Signed)
Urinary Tract Infection, Adult A urinary tract infection (UTI) is an infection of any part of the urinary tract. The urinary tract includes the kidneys, ureters, bladder, and urethra. These organs make, store, and get rid of urine in the body. An upper UTI affects the ureters and kidneys. A lower UTI affects the bladder and urethra. What are the causes? Most urinary tract infections are caused by bacteria in your genital area around your urethra, where urine leaves your body. These bacteria grow and cause inflammation of your urinary tract. What increases the risk? You are more likely to develop this condition if: You have a urinary catheter that stays in place. You are not able to control when you urinate or have a bowel movement (incontinence). You are female and you: Use a spermicide or diaphragm for birth control. Have low estrogen levels. Are pregnant. You have certain genes that increase your risk. You are sexually active. You take antibiotic medicines. You have a condition that causes your flow of urine to slow down, such as: An enlarged prostate, if you are female. Blockage in your urethra. A kidney stone. A nerve condition that affects your bladder control (neurogenic bladder). Not getting enough to drink, or not urinating often. You have certain medical conditions, such as: Diabetes. A weak disease-fighting system (immunesystem). Sickle cell disease. Gout. Spinal cord injury. What are the signs or symptoms? Symptoms of this condition include: Needing to urinate right away (urgency). Frequent urination. This may include small amounts of urine each time you urinate. Pain or burning with urination. Blood in the urine. Urine that smells bad or unusual. Trouble urinating. Cloudy urine. Vaginal discharge, if you are female. Pain in the abdomen or the lower back. You may also have: Vomiting or a decreased appetite. Confusion. Irritability or tiredness. A fever or  chills. Diarrhea. The first symptom in older adults may be confusion. In some cases, they may not have any symptoms until the infection has worsened. How is this diagnosed? This condition is diagnosed based on your medical history and a physical exam. You may also have other tests, including: Urine tests. Blood tests. Tests for STIs (sexually transmitted infections). If you have had more than one UTI, a cystoscopy or imaging studies may be done to determine the cause of the infections. How is this treated? Treatment for this condition includes: Antibiotic medicine. Over-the-counter medicines to treat discomfort. Drinking enough water to stay hydrated. If you have frequent infections or have other conditions such as a kidney stone, you may need to see a health care provider who specializes in the urinary tract (urologist). In rare cases, urinary tract infections can cause sepsis. Sepsis is a life-threatening condition that occurs when the body responds to an infection. Sepsis is treated in the hospital with IV antibiotics, fluids, and other medicines. Follow these instructions at home: Medicines Take over-the-counter and prescription medicines only as told by your health care provider. If you were prescribed an antibiotic medicine, take it as told by your health care provider. Do not stop using the antibiotic even if you start to feel better. General instructions Make sure you: Empty your bladder often and completely. Do not hold urine for long periods of time. Empty your bladder after sex. Wipe from front to back after urinating or having a bowel movement if you are female. Use each tissue only one time when you wipe. Drink enough fluid to keep your urine pale yellow. Keep all follow-up visits. This is important. Contact a health care provider   if: Your symptoms do not get better after 1-2 days. Your symptoms go away and then return. Get help right away if: You have severe pain in your  back or your lower abdomen. You have a fever or chills. You have nausea or vomiting. Summary A urinary tract infection (UTI) is an infection of any part of the urinary tract, which includes the kidneys, ureters, bladder, and urethra. Most urinary tract infections are caused by bacteria in your genital area. Treatment for this condition often includes antibiotic medicines. If you were prescribed an antibiotic medicine, take it as told by your health care provider. Do not stop using the antibiotic even if you start to feel better. Keep all follow-up visits. This is important. This information is not intended to replace advice given to you by your health care provider. Make sure you discuss any questions you have with your health care provider. Document Revised: 05/15/2020 Document Reviewed: 05/15/2020 [employer] Patient Education  2022 [employer]  

## 2021-04-26 NOTE — Progress Notes (Signed)
Acute Office Visit  Subjective:    Patient ID: [name] Riley, female    DOB: [birth year], 70 y.o.   MRN: [id number]  Chief Complaint  Patient presents with   Urinary Urgency   Dysuria    HPI Patient is in today for UTI symptoms x 3 days. She reports dysuria, lower abdominal pressure, urgency, and hesitancy. She also has some lower right sided back pain.She denies nausea, vomiting, or fever.   She just completed Augmentin for a wound about 6 days ago. She had been on keflex prior to this.   Past Medical History:  Diagnosis Date   Allergy    Breast cancer ([place]) 08/25/2011   L , invasive ductal carcinoma, ER/PR +,HER2 -   Cancer ([place])    left breast cancer   Coronary artery disease 2001   Heart attack [facility]) 09/2000   Sep 25, 2000  --no intervention   History of chemotherapy comp. 08/22/2012   4 cycles of FEC and $ cycles of Taxotere   Hyperlipidemia    Hypertension    Hypothyroidism    PONV (postoperative nausea and vomiting)    gets sick from anesthesia   Status post radiation therapy 07/09/12 - 08/22/2012   Left Breast, 60.4 gray    Past Surgical History:  Procedure Laterality Date   ABDOMINAL HYSTERECTOMY  1998   TAH, oophorectomy   APPENDECTOMY  1970   BREAST CYST EXCISION Left 07/29/2020   Procedure: WIDE EXCISION OF LEFT MASTECTOMY INCISION;  Surgeon: Donnie Mesa, MD;  Location: [place];  Service: General;  Laterality: Left;   [place]   removal of benign lump in rt breast   BREAST SURGERY  11/14/11   right simple mastectomy, left mrm   INCISION AND DRAINAGE OF WOUND Left 07/01/2017   Procedure: IRRIGATION AND DEBRIDEMENT CHEST WALL ABSCESS;  Surgeon: Donnie Mesa, MD;  Location: [facility];  Service: General;  Laterality: Left;   MASS EXCISION Left 06/22/2017   Procedure: EXCISION OF CHEST WALL MASSES;  Surgeon: Donnie Mesa, MD;  Location: [place];  Service: General;  Laterality: Left;   MASTECTOMY Bilateral    for left breast  cancer   OVARIAN CYST SURGERY Right [place]  08/30/2012   Procedure: REMOVAL PORT-A-CATH;  Surgeon: Imogene Burn. [name], MD;  Location: [place];  Service: General;  Laterality: Right;  port removal   PORTACATH PLACEMENT  11/14/2011   Procedure: INSERTION PORT-A-CATH;  Surgeon: Imogene Burn. [name], MD;  Location: [place];  Service: General;  Laterality: Right;   PORTACATH PLACEMENT N/A 04/25/2016   Procedure: INSERTION PORT-A-CATH LEFT CHEST;  Surgeon: Donnie Mesa, MD;  Location: [place];  Service: General;  Laterality: N/A;   skin tags  05/09/1997   left axillary left neck skin tags   TONSILLECTOMY  1968    Family History  Problem Relation Age of Onset   Hypertension Maternal Grandmother    Diabetes Maternal Grandmother    Cancer Father 52       lung cancer and Prostate Cancer   Hypertension Mother    Cancer Paternal Aunt        ovarian   Cancer Cousin        breast, paternal cousin   Cancer Paternal Uncle        stomach   Cancer Paternal Grandfather        Esophagus   Colon cancer Neg Hx     Social History  Socioeconomic History   Marital status: Married    Spouse name: Not on file   Number of children: 3   Years of education: Not on file   Highest education level: High school graduate  Occupational History   Occupation: Retired  Tobacco Use   Smoking status: Never   Smokeless tobacco: Never  Vaping Use   Vaping Use: Never used  Substance and Sexual Activity   Alcohol use: No   Drug use: No   Sexual activity: Yes    Birth control/protection: Surgical    Comment: menarche 70, Parity age 62, G96, P3, 1 miscarriage,  HRT x 5-10 yrs, Mild Hot Flashes  Other Topics Concern   Not on file  Social History Narrative   Lives at home.   Social Determinants of Health   Financial Resource Strain: Not on file  Food Insecurity: Not on file  Transportation Needs: Not on file  Physical Activity: Not on file  Stress: Not on  file  Social Connections: Not on file  Intimate Partner Violence: Not on file    Outpatient Medications Prior to Visit  Medication Sig Dispense Refill   abemaciclib (VERZENIO) 100 MG tablet TAKE 1 TABLET BY MOUTH 2 TIMES DAILY. SWALLOW TABLETS WHOLE. DO NOT CHEW, CRUSH, OR SPLIT TABLETS BEFORE SWALLOWING. 56 tablet 3   acetaminophen (TYLENOL) 500 MG tablet Take 1,000 mg by mouth every 6 (six) hours as needed for moderate pain.     Ascorbic Acid (VITAMIN C PO) Take by mouth.     cholecalciferol (VITAMIN D) 1000 units tablet Take 1 tablet (1,000 Units total) by mouth daily.     Docusate Sodium (DSS) 100 MG CAPS Take by mouth.     furosemide (LASIX) 20 MG tablet TAKE ONE (1) TABLET BY MOUTH EVERY DAY 30 tablet 1   letrozole (FEMARA) 2.5 MG tablet TAKE ONE (1) TABLET EACH DAY 90 tablet 3   levETIRAcetam (KEPPRA) 500 MG tablet Take 1 tablet (500 mg total) by mouth 2 (two) times daily. 60 tablet 3   levothyroxine (SYNTHROID) 100 MCG tablet TAKE ONE TABLET EACH MORNING BEFORE BREAKFAST 90 tablet 2   lidocaine-prilocaine (EMLA) cream APPLY TOPICALLY AS NEEDED FOR PORT ACCESS 30 g 3   metoprolol succinate (TOPROL-XL) 100 MG 24 hr tablet TAKE ONE (1) TABLET EACH DAY 90 tablet 0   Multiple Vitamins-Minerals (MULTIVITAMIN WITH MINERALS) tablet Take 1 tablet by mouth daily.     ondansetron (ZOFRAN-ODT) 4 MG disintegrating tablet TAKE 1 TABLET EVERY 6 HOURS AS NEEDED FOR NAUSEA AND VOMITING 30 tablet 3   predniSONE (DELTASONE) 50 MG tablet Patient to take 13 hours, 7 hours, and 1 hour prior to CT scan 3 tablet 0   rosuvastatin (CRESTOR) 5 MG tablet Take 1 tablet (5 mg total) by mouth daily. (new directions) 90 tablet 1   triamcinolone (KENALOG) 0.025 % ointment Apply 1 application topically 2 (two) times daily. 80 g 1   valsartan (DIOVAN) 80 MG tablet TAKE ONE (1) TABLET EACH DAY 90 tablet 1   potassium chloride SA (KLOR-CON) 20 MEQ tablet Take 1 tablet (20 mEq total) by mouth 2 (two) times daily for 7  days. 14 tablet 0   amoxicillin-clavulanate (AUGMENTIN) 875-125 MG tablet Take 1 tablet by mouth 2 (two) times daily. 28 tablet 0   No facility-administered medications prior to visit.    Allergies  Allergen Reactions   Cantaloupe Extract Allergy Skin Test Shortness Of Breath   Contrast Media [Iodinated Diagnostic Agents] Shortness Of Breath and  Rash   Pravastatin Other (See Comments)    Legs hurt   Zosyn [Piperacillin Sod-Tazobactam So] Rash and Other (See Comments)    Temperature increase, facial flushing   Latex Rash    Redness, itch     Review of Systems As per HPI.     Objective:    Physical Exam Vitals and nursing note reviewed.  Constitutional:      General: She is not in acute distress.    Appearance: Normal appearance. She is not ill-appearing, toxic-appearing or diaphoretic.  Cardiovascular:     Rate and Rhythm: Normal rate and regular rhythm.     Heart sounds: Normal heart sounds. No murmur heard. Pulmonary:     Effort: Pulmonary effort is normal. No respiratory distress.     Breath sounds: Normal breath sounds.  Abdominal:     General: Bowel sounds are normal. There is no distension.     Palpations: Abdomen is soft.     Tenderness: There is no abdominal tenderness. There is no right CVA tenderness, left CVA tenderness, guarding or rebound.  Neurological:     Mental Status: She is alert and oriented to person, place, and time.  Psychiatric:        Mood and Affect: Mood normal.        Behavior: Behavior normal.   Urine dipstick shows positive for nitrites, leukocytes, red blood cells, protein.   BP 108/67   Pulse (!) 129   Temp (!) 97.4 F (36.3 C) (Temporal)   Ht 5' 8"  (1.727 m)   Wt 153 lb (69.4 kg)   SpO2 97%   BMI 23.26 kg/m  Wt Readings from Last 3 Encounters:  04/26/21 153 lb (69.4 kg)  04/08/21 157 lb 1.6 oz (71.3 kg)  03/29/21 163 lb 4.8 oz (74.1 kg)    Health Maintenance Due  Topic Date Due   Zoster Vaccines- Shingrix (1 of 2) Never  done   COLONOSCOPY (Pts 45-77yr Insurance coverage will need to be confirmed)  05/30/2017   COVID-19 Vaccine (4 - Booster for Moderna series) 09/13/2020    There are no preventive care reminders to display for this patient.   Lab Results  Component Value Date   TSH 0.457 02/09/2021   Lab Results  Component Value Date   WBC 4.0 04/08/2021   HGB 11.3 (L) 04/08/2021   HCT 33.2 (L) 04/08/2021   MCV 109.2 (H) 04/08/2021   PLT 138 (L) 04/08/2021   Lab Results  Component Value Date   NA 138 04/08/2021   K 3.7 04/08/2021   CHLORIDE 104 09/22/2017   CO2 25 04/08/2021   GLUCOSE 109 (H) 04/08/2021   BUN 14 04/08/2021   CREATININE 0.86 04/08/2021   BILITOT 1.3 (H) 04/08/2021   ALKPHOS 209 (H) 04/08/2021   AST 61 (H) 04/08/2021   ALT 29 04/08/2021   PROT 6.6 04/08/2021   ALBUMIN 3.2 (L) 04/08/2021   CALCIUM 10.0 04/08/2021   ANIONGAP 11 04/08/2021   EGFR 56 (L) 02/09/2021   Lab Results  Component Value Date   CHOL 297 (H) 02/09/2021   Lab Results  Component Value Date   HDL 126 02/09/2021   Lab Results  Component Value Date   LDLCALC 155 (H) 02/09/2021   Lab Results  Component Value Date   TRIG 99 02/09/2021   Lab Results  Component Value Date   CHOLHDL 2.4 02/09/2021   Lab Results  Component Value Date   HGBA1C 5.8 (H) 10/24/2014  Assessment & Plan:   Jacques was seen today for urinary urgency and dysuria.  Diagnoses and all orders for this visit:  Acute cystitis with hematuria Micro and culture pending. Macrobid as below. Return to office for new or worsening symptoms, or if symptoms persist.  -     Urinalysis -     Urine Microscopic -     Urine Culture -     nitrofurantoin, macrocrystal-monohydrate, (MACROBID) 100 MG capsule; Take 1 capsule (100 mg total) by mouth 2 (two) times daily for 5 days. 1 po BId  The patient indicates understanding of these issues and agrees with the plan.   Gwenlyn Perking, FNP

## 2021-04-27 ENCOUNTER — Ambulatory Visit (HOSPITAL_COMMUNITY)
Admission: RE | Admit: 2021-04-27 | Discharge: 2021-04-27 | Disposition: A | Discharge status: Home / Self Care | Payer: Medicare Other | Source: Ambulatory Visit | Attending: Hematology and Oncology | Admitting: Hematology and Oncology

## 2021-04-27 DIAGNOSIS — I351 Nonrheumatic aortic (valve) insufficiency: Secondary | ICD-10-CM | POA: Insufficient documentation

## 2021-04-27 DIAGNOSIS — I2510 Atherosclerotic heart disease of native coronary artery without angina pectoris: Secondary | ICD-10-CM | POA: Insufficient documentation

## 2021-04-27 DIAGNOSIS — Z0189 Encounter for other specified special examinations: Secondary | ICD-10-CM

## 2021-04-27 DIAGNOSIS — Z79899 Other long term (current) drug therapy: Secondary | ICD-10-CM | POA: Insufficient documentation

## 2021-04-27 DIAGNOSIS — I252 Old myocardial infarction: Secondary | ICD-10-CM | POA: Diagnosis not present

## 2021-04-27 DIAGNOSIS — Z01818 Encounter for other preprocedural examination: Secondary | ICD-10-CM | POA: Insufficient documentation

## 2021-04-27 DIAGNOSIS — I10 Essential (primary) hypertension: Secondary | ICD-10-CM | POA: Insufficient documentation

## 2021-04-27 DIAGNOSIS — E785 Hyperlipidemia, unspecified: Secondary | ICD-10-CM | POA: Diagnosis not present

## 2021-04-27 DIAGNOSIS — Z5181 Encounter for therapeutic drug level monitoring: Secondary | ICD-10-CM | POA: Diagnosis not present

## 2021-04-27 LAB — ECHOCARDIOGRAM COMPLETE
Area-P 1/2: 4.21 cm2
P 1/2 time: 586 msec
S' Lateral: 2.8 cm

## 2021-04-27 NOTE — Progress Notes (Signed)
  Echocardiogram 2D Echocardiogram has been performed.  Fidel Levy 04/27/2021, 4:12 PM

## 2021-04-28 ENCOUNTER — Other Ambulatory Visit: Payer: Self-pay

## 2021-04-28 ENCOUNTER — Encounter (HOSPITAL_BASED_OUTPATIENT_CLINIC_OR_DEPARTMENT_OTHER): Payer: Medicare Other | Admitting: Physician Assistant

## 2021-04-28 DIAGNOSIS — S80811A Abrasion, right lower leg, initial encounter: Secondary | ICD-10-CM | POA: Diagnosis not present

## 2021-04-28 NOTE — Progress Notes (Signed)
[name], [name] ([id number]) Visit Report for 04/28/2021 SuperBill Details Patient Name: Date of Service: [name] Riley, [name] Riley 04/28/2021 Medical Record Number: [id number] Patient Account Number: [id number] Date of Birth/Sex: Treating RN: 1951-10-13 (70 y.o. 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

## 2021-04-28 NOTE — Progress Notes (Signed)
ENOLIA, KOEPKE ([id number]) Visit Report for 04/28/2021 Arrival Information Details Patient Name: Date of Service: [name] Riley, [name] A. 04/28/2021 8:30 A M Medical Record Number: [id number] Patient Account Number: [id number] Date of Birth/Sex: Treating RN: [birth year] (70 y.o. Helene Shoe, Tammi Klippel Primary Care Merrin Mcvicker: [name] Riley Other Clinician: Referring Daniella Dewberry: Treating Curly Mackowski/Extender: [name] Riley in Treatment: 2 Visit Information History Since Last Visit Added or deleted any medications: Yes Patient Arrived: Ambulatory Any new allergies or adverse reactions: No Arrival Time: 08:45 Had a fall or experienced change in No Accompanied By: self activities of daily living that may affect Transfer Assistance: None risk of falls: Patient Identification Verified: Yes Signs or symptoms of abuse/neglect since last visito No Secondary Verification Process Completed: Yes Hospitalized since last visit: No Patient Requires Transmission-Based Precautions: No Implantable device outside of the clinic excluding No Patient Has Alerts: No cellular tissue based products placed in the center since last visit: Has Dressing in Place as Prescribed: Yes Has Compression in Place as Prescribed: Yes Pain Present Now: No Notes per patient has a UTI and PCP given an oral abx Monday. See medication list. Electronic Signature(s) Signed: 04/28/2021 6:29:13 PM By: Deon Pilling Entered By: Deon Pilling on 04/28/2021 12:23:12 -------------------------------------------------------------------------------- Compression Therapy Details Patient Name: Date of Service: [name] Riley, [name] 04/28/2021 8:30 A M Medical Record Number: [id number] Patient Account Number: [id number] Date of Birth/Sex: Treating RN: [birth year] (70 y.o. [name] Riley Primary Care Adrienna Karis: [name] Riley Other Clinician: Referring Dennisse Swader: Treating Amerigo Mcglory/Extender: [name] Riley in  Treatment: 2 Compression Therapy Performed for Wound Assessment: Wound #1 Right,Anterior Lower Leg Performed By: Clinician Deon Pilling, RN Compression Type: Three Layer Electronic Signature(s) Signed: 04/28/2021 6:29:13 PM By: Deon Pilling Entered By: Deon Pilling on 04/28/2021 12:24:42 -------------------------------------------------------------------------------- Encounter Discharge Information Details Patient Name: Date of Service: [name] Riley, [name] 04/28/2021 8:30 A M Medical Record Number: [id number] Patient Account Number: [id number] Date of Birth/Sex: Treating RN: [birth year] (70 y.o. [name] Riley Primary Care Valeree Leidy: [name] Riley Other Clinician: Referring Stillman Buenger: Treating Kirt Chew/Extender: [name] Riley in Treatment: 2 Encounter Discharge Information Items Discharge Condition: Stable Ambulatory Status: Ambulatory Discharge Destination: Home Transportation: Private Auto Accompanied By: self Schedule Follow-up Appointment: Yes Clinical Summary of Care: Electronic Signature(s) Signed: 04/28/2021 6:29:13 PM By: Deon Pilling Entered By: Deon Pilling on 04/28/2021 12:25:16 -------------------------------------------------------------------------------- Patient/Caregiver Education Details Patient Name: Date of Service: [name] Riley A. 7/13/2022andnbsp8:30 [place] Record Number: [id number] Patient Account Number: [id number] Date of Birth/Gender: Treating RN: [birth year] (70 y.o. [name] Riley Primary Care Physician: [name] Riley Other Clinician: Referring Physician: Treating Physician/Extender: [name] Riley in Treatment: 2 Education Assessment Education Provided To: Patient Education Topics Provided Venous: Handouts: Managing Venous Disease and Related Ulcers Methods: Explain/Verbal Responses: Reinforcements needed Electronic Signature(s) Signed: 04/28/2021 6:29:13 PM By: Deon Pilling Entered By: Deon Pilling on 04/28/2021 12:25:05 -------------------------------------------------------------------------------- Wound Assessment Details Patient Name: Date of Service: [name] Riley, [name] 04/28/2021 8:30 A M Medical Record Number: [id number] Patient Account Number: [id number] Date of Birth/Sex: Treating RN: [name] Riley (70 y.o. [name] Riley Primary Care Burnell Matlin: [name] Riley Other Clinician: Referring Vern Prestia: Treating Priscilla Kirstein/Extender: [name] Riley in Treatment: 2 Wound Status Wound Number: 1 Primary Trauma, Other Etiology: Wound Location: Right, Anterior Lower Leg Wound Open Wounding Event: Trauma Status: Date Acquired: 03/17/2021 Comorbid Coronary Artery Disease, Hypertension, Myocardial Infarction, Weeks Of Treatment: 2 History: Neuropathy, Received  Chemotherapy, Received Radiation Clustered Wound: No Wound Measurements Length: (cm) 1.4 Width: (cm) 1.5 Depth: (cm) 0.2 Area: (cm) 1.649 Volume: (cm) 0.33 % Reduction in Area: 0% % Reduction in Volume: -100% Epithelialization: None Tunneling: No Undermining: No Wound Description Classification: Full Thickness Without Exposed Support Structures Wound Margin: Distinct, outline attached Exudate Amount: Medium Exudate Type: Serosanguineous Exudate Color: red, brown Foul Odor After Cleansing: No Slough/Fibrino Yes Wound Bed Granulation Amount: Small (1-33%) Exposed Structure Granulation Quality: Red, Pink Fascia Exposed: No Necrotic Amount: Large (67-100%) Fat Layer (Subcutaneous Tissue) Exposed: Yes Necrotic Quality: Adherent Slough Tendon Exposed: No Muscle Exposed: No Joint Exposed: No Bone Exposed: No Treatment Notes Wound #1 (Lower Leg) Wound Laterality: Right, Anterior Cleanser Peri-Wound Care Sween Lotion (Moisturizing lotion) Discharge Instruction: Apply moisturizing lotion to lower leg Topical Primary Dressing Hydrofera Blue Classic Foam, 2x2  in Discharge Instruction: Moisten with saline prior to applying to wound bed Secondary Dressing Woven Gauze Sponge, Non-Sterile 4x4 in Discharge Instruction: Apply over primary dressing as directed. Secured With Compression Wrap ThreePress (3 layer compression wrap) Discharge Instruction: Apply three layer compression as directed. Compression Stockings Add-Ons Electronic Signature(s) Signed: 04/28/2021 6:29:13 PM By: Deon Pilling Entered By: Deon Pilling on 04/28/2021 12:23:42 -------------------------------------------------------------------------------- Vitals Details Patient Name: Date of Service: [name] Riley, [name] 04/28/2021 8:30 A M Medical Record Number: [id number] Patient Account Number: [id number] Date of Birth/Sex: Treating RN: [birth year] (70 y.o. Helene Shoe, Tammi Klippel Primary Care Lashundra Shiveley: [name] Riley Other Clinician: Referring Shela Esses: Treating Maddon Horton/Extender: [name] Riley in Treatment: 2 Vital Signs Time Taken: 08:45 Temperature (F): 98.3 Height (in): 68 Pulse (bpm): 90 Weight (lbs): 157 Respiratory Rate (breaths/min): 20 Body Mass Index (BMI): 23.9 Blood Pressure (mmHg): 148/75 Reference Range: 80 - 120 mg / dl Electronic Signature(s) Signed: 04/28/2021 6:29:13 PM By: Deon Pilling Entered By: Deon Pilling on 04/28/2021 12:22:32

## 2021-04-29 ENCOUNTER — Other Ambulatory Visit: Payer: Medicare Other

## 2021-04-29 ENCOUNTER — Ambulatory Visit: Payer: Medicare Other | Admitting: Hematology and Oncology

## 2021-04-29 ENCOUNTER — Other Ambulatory Visit (HOSPITAL_COMMUNITY): Payer: Self-pay

## 2021-04-29 ENCOUNTER — Encounter: Payer: Self-pay | Admitting: Hematology and Oncology

## 2021-04-29 ENCOUNTER — Ambulatory Visit: Payer: Medicare Other

## 2021-04-29 LAB — URINE CULTURE

## 2021-05-02 NOTE — Progress Notes (Signed)
Patient Care Team: [name], MD as PCP - General (Family Medicine) Minus Breeding, MD as PCP - Cardiology (Cardiology) Nicholas Lose, MD as Consulting Physician (Hematology and Oncology)  DIAGNOSIS:    ICD-10-CM   1. Malignant neoplasm of upper-outer quadrant of left breast in female, estrogen receptor positive ([place])  C50.412    Z17.0       SUMMARY OF ONCOLOGIC HISTORY: Oncology History  Breast cancer of upper-outer quadrant of left female breast ([place])  11/14/2011 Surgery   Bilateral mastectomy, prophylactic on the right, left breast IDC 3/18 lymph nodes positive with extracapsular extension ER 89%, PR 81%, HER-2 negative, Ki-67 79% T2 N1 A. stage IIB    12/13/2011 - 06/28/2012 Chemotherapy   4 cycles of FEC followed by 4 cycles of Taxotere    07/17/2012 - 08/22/2012 Radiation Therapy   Adjuvant radiation therapy    08/22/2012 - 03/16/2016 Anti-estrogen oral therapy   Arimidex 1 mg daily    03/16/2016 Relapse/Recurrence   Subcutaneous nodule excision left chest: Infiltrating carcinoma breast primary, ER positive, PR negative    03/29/2016 Imaging   CT CAP and bone scan: Lytic lesions T8 vertebral, T1 posterior element, subcutaneous nodule left lateral chest wall, nonspecific lung nodules; Bone scan: Mets to kull, left humerus, left eighth rib, T7/T8, sternum, left acetabulum    04/28/2016 - 06/17/2017 Chemotherapy   Herceptin, lapatinib, Faslodex, Zometa every 4 weeks, lapatinib discontinued in September 2018 due to elevation of LFTs   06/22/2017 Relapse/Recurrence   Surgical excision:Soft tissue mass left lateral chest wall primary breast cancer, soft tissue mass left medial chest wall breast cancer, tumor is within the dermis extending to the subcutaneous adipose tissue and involves portions of skeletal muscle    06/22/2017 Cancer Staging   Staging form: Breast, AJCC 7th Edition - Pathologic stage from 06/22/2017: Stage IV (TX, NX, M1) - Signed by Nicholas Lose, MD on  12/27/2019    08/2017 - 02/16/2018 Chemotherapy   Faslodex with Herceptin and Perjeta along with Zometa every 4 weeks   03/02/2018 - 05/25/2018 Chemotherapy   Kadcyla   05/11/2018 Imaging   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 Associated vasogenic edema within the underlying right cerebral hemisphere without significant midline shift. Signal abnormality throughout the visualized bone marrow, compatible with osseous metastatic disease.    06/04/2018 Imaging   CT CAP: Right lower lobe lung nodule 7 mm (was 5 mm); multiple bone metastases throughout the spine and ribs sternum scapula and humerus, slightly increased lower thoracic mets, right renal lesion 2.5 cm (was 1.2 cm) right femur met increased from 2.1 cm to 2.9 cm    06/15/2018 -  Anti-estrogen oral therapy   Abemaciclib, Herceptin, letrozole, Xgeva    06/07/2019 Imaging   Progression of brain metastases,2 discrete dural-based lesions involving the posterior right frontal lobe. The more anterior lesion now measures 16.5 x 17.5 x 14 mm. The posterior lesion now measures 9.5 x 13 x 20 mm.  Vasogenic edema of the frontal and parietal lobes   06/21/2019 - 06/27/2019 Radiation Therapy   SRS to the brain   07/19/2020 Relapse/Recurrence   Left mastectomy scar nodule: Biopsy invasive carcinoma ER 60%, PR 0%, HER-2 negative by [facility]   07/29/2020 Surgery   Soft tissue mass left chest wall excision: No evidence of malignancy.   11/27/2020 - 02/05/2021 Chemotherapy      Patient is on Antibody Plan: BREAST ADO-TRASTUZUMAB EMTANSINE ([place]) Q21D     01/15/2021 -  Chemotherapy  Patient is on Antibody Plan: BREAST ADO-TRASTUZUMAB EMTANSINE (KADCYLA) Q21D     Metastatic breast cancer ([place])  06/29/2017 Initial Diagnosis   Metastatic breast cancer ([place])    06/15/2018 - 12/25/2020 Chemotherapy    Patient is on Treatment Plan: BRAIN GBM BEVACIZUMAB 14D X 6 CYCLES       01/15/2021 -  Chemotherapy      Patient is  on Antibody Plan: BREAST ADO-TRASTUZUMAB EMTANSINE ([place]) Q21D     Brain metastasis ([place])  07/13/2018 Initial Diagnosis   Brain metastasis ([place])    11/27/2020 - 02/05/2021 Chemotherapy      Patient is on Antibody Plan: BREAST ADO-TRASTUZUMAB EMTANSINE (KADCYLA) Q21D       CHIEF COMPLIANT: Cycle 6 Kadcyla  INTERVAL HISTORY: [name] Riley is a 70 y.o. with above-mentioned history of metastatic breast cancer currently on abemaciclib with letrozole along with Herceptin, Avastin, and Xgeva. She presents to the clinic today for cycle 5 of Kadcyla.   ALLERGIES:  is allergic to cantaloupe extract allergy skin test, contrast media [iodinated diagnostic agents], pravastatin, zosyn [piperacillin sod-tazobactam so], and latex.  MEDICATIONS:  Current Outpatient Medications  Medication Sig Dispense Refill   abemaciclib (VERZENIO) 100 MG tablet TAKE 1 TABLET BY MOUTH 2 TIMES DAILY. SWALLOW TABLETS WHOLE. DO NOT CHEW, CRUSH, OR SPLIT TABLETS BEFORE SWALLOWING. 56 tablet 3   acetaminophen (TYLENOL) 500 MG tablet Take 1,000 mg by mouth every 6 (six) hours as needed for moderate pain.     Ascorbic Acid (VITAMIN C PO) Take by mouth.     cholecalciferol (VITAMIN D) 1000 units tablet Take 1 tablet (1,000 Units total) by mouth daily.     Docusate Sodium (DSS) 100 MG CAPS Take by mouth.     furosemide (LASIX) 20 MG tablet TAKE ONE (1) TABLET BY MOUTH EVERY DAY 30 tablet 1   letrozole (FEMARA) 2.5 MG tablet TAKE ONE (1) TABLET EACH DAY 90 tablet 3   levETIRAcetam (KEPPRA) 500 MG tablet Take 1 tablet (500 mg total) by mouth 2 (two) times daily. 60 tablet 3   levothyroxine (SYNTHROID) 100 MCG tablet TAKE ONE TABLET EACH MORNING BEFORE BREAKFAST 90 tablet 2   lidocaine-prilocaine (EMLA) cream APPLY TOPICALLY AS NEEDED FOR PORT ACCESS 30 g 3   metoprolol succinate (TOPROL-XL) 100 MG 24 hr tablet TAKE ONE (1) TABLET EACH DAY 90 tablet 0   Multiple Vitamins-Minerals (MULTIVITAMIN WITH MINERALS) tablet Take 1  tablet by mouth daily.     ondansetron (ZOFRAN-ODT) 4 MG disintegrating tablet TAKE 1 TABLET EVERY 6 HOURS AS NEEDED FOR NAUSEA AND VOMITING 30 tablet 3   potassium chloride SA (KLOR-CON) 20 MEQ tablet Take 1 tablet (20 mEq total) by mouth 2 (two) times daily for 7 days. 14 tablet 0   predniSONE (DELTASONE) 50 MG tablet Patient to take 13 hours, 7 hours, and 1 hour prior to CT scan 3 tablet 0   rosuvastatin (CRESTOR) 5 MG tablet Take 1 tablet (5 mg total) by mouth daily. (new directions) 90 tablet 1   triamcinolone (KENALOG) 0.025 % ointment Apply 1 application topically 2 (two) times daily. 80 g 1   valsartan (DIOVAN) 80 MG tablet TAKE ONE (1) TABLET EACH DAY 90 tablet 1   No current facility-administered medications for this visit.    PHYSICAL EXAMINATION: ECOG PERFORMANCE STATUS: 1 - Symptomatic but completely ambulatory  Vitals:   05/03/21 1430  BP: 130/62  Pulse: 86  Resp: 18  Temp: 97.7 F (36.5 C)  SpO2: 98%   Filed Weights  05/03/21 1430  Weight: 154 lb 3.2 oz (69.9 kg)    LABORATORY DATA:  I have reviewed the data as listed CMP Latest Ref Rng & Units 04/08/2021 03/29/2021 03/19/2021  Glucose 70 - 99 mg/dL 109(H) 101(H) 111(H)  BUN 8 - 23 mg/dL 14 11 12   Creatinine 0.44 - 1.00 mg/dL 0.86 0.73 0.84  Sodium 135 - 145 mmol/L 138 137 140  Potassium 3.5 - 5.1 mmol/L 3.7 2.8(L) 3.2(L)  Chloride 98 - 111 mmol/L 102 99 101  CO2 22 - 32 mmol/L 25 28 28   Calcium 8.9 - 10.3 mg/dL 10.0 9.0 10.1  Total Protein 6.5 - 8.1 g/dL 6.6 - 6.4(L)  Total Bilirubin 0.3 - 1.2 mg/dL 1.3(H) - 0.9  Alkaline Phos 38 - 126 U/L 209(H) - 217(H)  AST 15 - 41 U/L 61(H) - 48(H)  ALT 0 - 44 U/L 29 - 22    Lab Results  Component Value Date   WBC 6.3 05/03/2021   HGB 10.8 (L) 05/03/2021   HCT 31.9 (L) 05/03/2021   MCV 110.0 (H) 05/03/2021   PLT 119 (L) 05/03/2021   NEUTROABS 3.5 05/03/2021    ASSESSMENT & PLAN:  Breast cancer of upper-outer quadrant of left female breast ([place]) Current  treatment:  Abemaciclib with letrozole along with Herceptin (to be given every 4 weeks) and Xgeva started 06/15/2018   CT CAP 05/25/2020: No significant interval change in the bone metastases. 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 Mickeal Skinner is adding Avastin to the treatment. Today is cycle 1   CTCAP 11/26/2020: New hepatic lesions worrisome for hepatic mets (1.6 cm, 1.2 cm, 2.3 cm)   Recommendation: 1. Biopsy of the liver lesion.  12/11/2020: Metastatic carcinoma, ER 75% week to moderate, PR 0%, Ki-67 25%, HER-2 equivocal by IHC, FISH positive ratio 2.97, copy #5.2 2. Change treatment plan: Kadcyla along with abemaciclib and letrozole ---------------------------------------------------------------------------------------------------------------------  Current Treatment: Kadcyla q [redacted] weeks along with Verzenio and letrozole. Today is cycle 6 (she completed bevacizumab x4)   Kadcyla toxicities:  1.  Fatigue as a result of chemotherapy: Stable 2. chemo induced anemia: Monitoring closely, today's hemoglobin is 11.3 3. peripheral neuropathy 4.  Emergency room visit for leg discomfort: Hypokalemic sent home with potassium replacement therapy 5.  Leukopenia: Related to Verzenio, stable   Nonhealing wound on the right leg: Wound care is assisting her Easy bruising on the hands: Could be the effect of prior Avastin therapy   Abemaciclib toxicities: denies diarrhea, Complains of more fatigue   CT CAP 04/02/21: Unchanged  Hepatic and osseous disease   Return to clinic every 3 weeks for Kadcyla.    No orders of the defined types were placed in this encounter.  The patient has a good understanding of the overall plan. she agrees with it. she will call with any problems that may develop before the next visit here.  Total  time spent: 30 mins including face to face time and time spent for planning, charting and coordination of care  Rulon Eisenmenger, MD, MPH 05/03/2021  I, Thana Ates, am acting as scribe for Dr. Nicholas Lose.  I have reviewed the above documentation for accuracy and completeness, and I agree with the above.

## 2021-05-03 ENCOUNTER — Inpatient Hospital Stay: Payer: Medicare Other | Attending: Hematology and Oncology

## 2021-05-03 ENCOUNTER — Other Ambulatory Visit (HOSPITAL_COMMUNITY): Payer: Self-pay

## 2021-05-03 ENCOUNTER — Other Ambulatory Visit: Payer: Self-pay

## 2021-05-03 ENCOUNTER — Inpatient Hospital Stay (HOSPITAL_BASED_OUTPATIENT_CLINIC_OR_DEPARTMENT_OTHER): Payer: Medicare Other | Admitting: Hematology and Oncology

## 2021-05-03 ENCOUNTER — Inpatient Hospital Stay: Payer: Medicare Other

## 2021-05-03 DIAGNOSIS — Z170 Estrogen receptor positive status [ER+]: Secondary | ICD-10-CM | POA: Diagnosis not present

## 2021-05-03 DIAGNOSIS — C50412 Malignant neoplasm of upper-outer quadrant of left female breast: Secondary | ICD-10-CM | POA: Insufficient documentation

## 2021-05-03 DIAGNOSIS — D6481 Anemia due to antineoplastic chemotherapy: Secondary | ICD-10-CM | POA: Insufficient documentation

## 2021-05-03 DIAGNOSIS — Z79899 Other long term (current) drug therapy: Secondary | ICD-10-CM | POA: Insufficient documentation

## 2021-05-03 DIAGNOSIS — G620 Drug-induced polyneuropathy: Secondary | ICD-10-CM | POA: Diagnosis not present

## 2021-05-03 DIAGNOSIS — R5383 Other fatigue: Secondary | ICD-10-CM | POA: Diagnosis not present

## 2021-05-03 DIAGNOSIS — Z95828 Presence of other vascular implants and grafts: Secondary | ICD-10-CM

## 2021-05-03 DIAGNOSIS — N3001 Acute cystitis with hematuria: Secondary | ICD-10-CM | POA: Diagnosis not present

## 2021-05-03 DIAGNOSIS — C7931 Secondary malignant neoplasm of brain: Secondary | ICD-10-CM | POA: Insufficient documentation

## 2021-05-03 DIAGNOSIS — T451X5A Adverse effect of antineoplastic and immunosuppressive drugs, initial encounter: Secondary | ICD-10-CM | POA: Diagnosis not present

## 2021-05-03 DIAGNOSIS — C50919 Malignant neoplasm of unspecified site of unspecified female breast: Secondary | ICD-10-CM

## 2021-05-03 DIAGNOSIS — Z5112 Encounter for antineoplastic immunotherapy: Secondary | ICD-10-CM | POA: Insufficient documentation

## 2021-05-03 DIAGNOSIS — D72819 Decreased white blood cell count, unspecified: Secondary | ICD-10-CM | POA: Diagnosis not present

## 2021-05-03 LAB — CMP (CANCER CENTER ONLY)
ALT: 18 U/L (ref 0–44)
AST: 49 U/L — ABNORMAL HIGH (ref 15–41)
Albumin: 3.2 g/dL — ABNORMAL LOW (ref 3.5–5.0)
Alkaline Phosphatase: 181 U/L — ABNORMAL HIGH (ref 38–126)
Anion gap: 8 (ref 5–15)
BUN: 12 mg/dL (ref 8–23)
CO2: 31 mmol/L (ref 22–32)
Calcium: 9.5 mg/dL (ref 8.9–10.3)
Chloride: 101 mmol/L (ref 98–111)
Creatinine: 0.71 mg/dL (ref 0.44–1.00)
GFR, Estimated: >60 mL/min (ref >60)
Glucose, Bld: 89 mg/dL (ref 70–99)
Potassium: 3.2 mmol/L — ABNORMAL LOW (ref 3.5–5.1)
Sodium: 140 mmol/L (ref 135–145)
Total Bilirubin: 1.2 mg/dL (ref 0.3–1.2)
Total Protein: 6.3 g/dL — ABNORMAL LOW (ref 6.5–8.1)

## 2021-05-03 LAB — CBC WITH DIFFERENTIAL (CANCER CENTER ONLY)
Abs Immature Granulocytes: 0.02 10*3/uL (ref 0.00–0.07)
Basophils Absolute: 0 10*3/uL (ref 0.0–0.1)
Basophils Relative: 1 %
Eosinophils Absolute: 0.1 10*3/uL (ref 0.0–0.5)
Eosinophils Relative: 1 %
HCT: 31.9 % — ABNORMAL LOW (ref 36.0–46.0)
Hemoglobin: 10.8 g/dL — ABNORMAL LOW (ref 12.0–15.0)
Immature Granulocytes: 0 %
Lymphocytes Relative: 34 %
Lymphs Abs: 2.2 10*3/uL (ref 0.7–4.0)
MCH: 37.2 pg — ABNORMAL HIGH (ref 26.0–34.0)
MCHC: 33.9 g/dL (ref 30.0–36.0)
MCV: 110 fL — ABNORMAL HIGH (ref 80.0–100.0)
Monocytes Absolute: 0.5 10*3/uL (ref 0.1–1.0)
Monocytes Relative: 8 %
Neutro Abs: 3.5 10*3/uL (ref 1.7–7.7)
Neutrophils Relative %: 56 %
Platelet Count: 119 10*3/uL — ABNORMAL LOW (ref 150–400)
RBC: 2.9 MIL/uL — ABNORMAL LOW (ref 3.87–5.11)
RDW: 15.9 % — ABNORMAL HIGH (ref 11.5–15.5)
WBC Count: 6.3 10*3/uL (ref 4.0–10.5)
nRBC: 0 % (ref 0.0–0.2)

## 2021-05-03 MED ORDER — ACETAMINOPHEN 325 MG PO TABS
ORAL_TABLET | ORAL | Status: AC
  Filled 2021-05-03: qty 2

## 2021-05-03 MED ORDER — SODIUM CHLORIDE 0.9 % IV SOLN
3.4000 mg/kg | Freq: Once | INTRAVENOUS | Status: AC
  Administered 2021-05-03: 260 mg via INTRAVENOUS
  Filled 2021-05-03: qty 5

## 2021-05-03 MED ORDER — HEPARIN SOD (PORK) LOCK FLUSH 100 UNIT/ML IV SOLN
500.0000 [IU] | Freq: Once | INTRAVENOUS | Status: AC | PRN
  Administered 2021-05-03: 500 [IU]
  Filled 2021-05-03: qty 5

## 2021-05-03 MED ORDER — DIPHENHYDRAMINE HCL 25 MG PO CAPS
50.0000 mg | ORAL_CAPSULE | Freq: Once | ORAL | Status: AC
  Administered 2021-05-03: 50 mg via ORAL

## 2021-05-03 MED ORDER — SODIUM CHLORIDE 0.9% FLUSH
10.0000 mL | INTRAVENOUS | Status: DC | PRN
  Administered 2021-05-03: 10 mL
  Filled 2021-05-03: qty 10

## 2021-05-03 MED ORDER — SODIUM CHLORIDE 0.9% FLUSH
10.0000 mL | INTRAVENOUS | Status: AC | PRN
Start: 2021-05-03 — End: 2021-05-03
  Administered 2021-05-03: 10 mL
  Filled 2021-05-03: qty 10

## 2021-05-03 MED ORDER — SODIUM CHLORIDE 0.9 % IV SOLN
Freq: Once | INTRAVENOUS | Status: AC
  Filled 2021-05-03: qty 250

## 2021-05-03 MED ORDER — DIPHENHYDRAMINE HCL 25 MG PO CAPS
ORAL_CAPSULE | ORAL | Status: AC
  Filled 2021-05-03: qty 2

## 2021-05-03 MED ORDER — ACETAMINOPHEN 325 MG PO TABS
650.0000 mg | ORAL_TABLET | Freq: Once | ORAL | Status: AC
  Administered 2021-05-03: 650 mg via ORAL

## 2021-05-03 MED ORDER — NITROFURANTOIN MONOHYD MACRO 100 MG PO CAPS
100.0000 mg | ORAL_CAPSULE | Freq: Two times a day (BID) | ORAL | 1 refills | Status: AC
Start: 2021-05-03 — End: 2021-05-08

## 2021-05-03 NOTE — Assessment & Plan Note (Signed)
Current treatment: Abemaciclibwith letrozole along with Herceptin(to be given every 4 weeks)and Xgeva started 06/15/2018  CT CAP 05/25/2020: No significant interval change in the bone metastases. 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 Mickeal Skinner is adding Avastin to the treatment.Today is cycle 1  CTCAP2/07/2021: New hepatic lesions worrisome for hepatic mets (1.6 cm, 1.2 cm, 2.3 cm)  Recommendation: 1. Biopsy of the liver lesion.12/11/2020: Metastatic carcinoma, ER 75% week to moderate, PR 0%, Ki-67 25%, HER-2 equivocal by IHC, FISH positive ratio 2.97, copy #5.2 2. Change treatment plan:Kadcylaalong withabemaciclib and letrozole --------------------------------------------------------------------------------------------------------------------- Current Treatment:Kadcyla q [redacted] weeksalong with Verzenio and letrozole. Today is cycle5(she completed bevacizumab x4)  Kadcyla toxicities: 1.Fatigue as a result of chemotherapy: Stable 2.chemo induced anemia: Monitoring closely, today's hemoglobin is 11.3 3.peripheral neuropathy 4.  Emergency room visit for leg discomfort: Hypokalemic sent home with potassium replacement therapy 5.  Leukopenia: Related to Verzenio, stable  Nonhealing wound on the right leg: I will send for Augmentin for 2 weeks. Easy bruising on the hands: Could be the effect of prior Avastin therapy  Abemaciclib toxicities:She had 1 episode of diarrhea which was controlled with Imodium.  CT CAP 04/02/21: Unchanged  Hepatic and osseous disease Based on stable findings of the CT scans we will continue with the same treatment. Return to clinic every 3 weeks for Kadcyla.

## 2021-05-03 NOTE — Patient Instructions (Signed)
[facility] MEDICAL [facility]COLOGY   °Discharge Instructions: °Thank you for choosing [facility] to provide your oncology and hematology care.  ° °If you have a lab appointment with the Cancer Center, please go directly to the Cancer Center and check in at the registration area. °  °Wear comfortable clothing and clothing appropriate for easy access to any Portacath or PICC line.  ° °We strive to give you quality time with your provider. You may need to reschedule your appointment if you arrive late (15 or more minutes).  Arriving late affects you and other patients whose appointments are after yours.  Also, if you miss three or more appointments without notifying the office, you may be dismissed from the clinic at the provider’s discretion.    °  °For prescription refill requests, have your pharmacy contact our office and allow 72 hours for refills to be completed.   ° °Today you received the following chemotherapy and/or immunotherapy agents: ado-trastuzumab emtansine    °  °To help prevent nausea and vomiting after your treatment, we encourage you to take your nausea medication as directed. ° °BELOW ARE SYMPTOMS THAT SHOULD BE REPORTED IMMEDIATELY: °*FEVER GREATER THAN 100.4 F (38 °C) OR HIGHER °*CHILLS OR SWEATING °*NAUSEA AND VOMITING THAT IS NOT CONTROLLED WITH YOUR NAUSEA MEDICATION °*UNUSUAL SHORTNESS OF BREATH °*UNUSUAL BRUISING OR BLEEDING °*URINARY PROBLEMS (pain or burning when urinating, or frequent urination) °*BOWEL PROBLEMS (unusual diarrhea, constipation, pain near the anus) °TENDERNESS IN MOUTH AND THROAT WITH OR WITHOUT PRESENCE OF ULCERS (sore throat, sores in mouth, or a toothache) °UNUSUAL RASH, SWELLING OR PAIN  °UNUSUAL VAGINAL DISCHARGE OR ITCHING  ° °Items with * indicate a potential emergency and should be followed up as soon as possible or go to the Emergency Department if any problems should occur. ° °Please show the CHEMOTHERAPY ALERT CARD or IMMUNOTHERAPY ALERT  CARD at check-in to the Emergency Department and triage nurse. ° °Should you have questions after your visit or need to cancel or reschedule your appointment, please contact [facility]  Dept: [phone]  and follow the prompts.  Office hours are 8:00 a.m. to 4:30 p.m. Monday - Friday. Please note that voicemails left after 4:00 p.m. may not be returned until the following business day.  We are closed weekends and major holidays. You have access to a nurse at all times for urgent questions. Please call the main number to the clinic Dept: [phone] and follow the prompts. ° ° °For any non-urgent questions, you may also contact your provider using MyChart. We now offer e-Visits for anyone 18 and older to request care online for non-urgent symptoms. For details visit [email or web address]. °  °Also download the MyChart app! Go to the app store, search "MyChart", open the app, select [place], and log in with your MyChart username and password. ° °Due to Covid, a mask is required upon entering the hospital/clinic. If you do not have a mask, one will be given to you upon arrival. For doctor visits, patients may have 1 support person aged 18 or older with them. For treatment visits, patients cannot have anyone with them due to current Covid guidelines and our immunocompromised population.  ° °

## 2021-05-05 ENCOUNTER — Other Ambulatory Visit: Payer: Self-pay

## 2021-05-05 ENCOUNTER — Encounter (HOSPITAL_BASED_OUTPATIENT_CLINIC_OR_DEPARTMENT_OTHER): Payer: Medicare Other | Admitting: Physician Assistant

## 2021-05-05 DIAGNOSIS — S80811A Abrasion, right lower leg, initial encounter: Secondary | ICD-10-CM | POA: Diagnosis not present

## 2021-05-06 NOTE — Progress Notes (Signed)
[name] Riley, [name] Riley ([id number]) Visit Report for 05/05/2021 Chief Complaint Document Details Patient Name: Date of Service: [name] Riley, [name] Riley 05/05/2021 8:15 A M Medical Record Number: [id number] Patient Account Number: [id number] Date of Birth/Sex: Treating RN: 1951/09/02 (70 y.o. [name] Riley Primary Care Provider: [name] Other Clinician: Referring Provider: Treating Provider/Extender: Vickii Penna in Treatment: 3 Information Obtained from: Patient Chief Complaint Right LE Ulcer Electronic Signature(s) Signed: 05/05/2021 5:20:22 PM By: Worthy Keeler PA-C Entered By: Worthy Keeler on 05/05/2021 08:08:45 -------------------------------------------------------------------------------- HPI Details Patient Name: Date of Service: [name], [name] 05/05/2021 8:15 A M Medical Record Number: [id number] Patient Account Number: [id number] Date of Birth/Sex: Treating RN: February 12, 1951 (70 y.o. [name] Riley Primary Care Provider: [name] Other Clinician: Referring Provider: Treating Provider/Extender: Vickii Penna in Treatment: 3 History of Present Illness HPI Description: 04/14/2021 upon evaluation today patient presents for initial inspection here in the clinic concerning issues that she has been having with a wound on her right anterior lower leg after she had an abrasion trauma 4 weeks ago. She tells me that unfortunately following this trauma she has been experiencing quite a bit of discomfort at times with some burning and stinging. She is also had a scab up once or twice but states that right now she has been unable to really get it to scab back over. This has been a frustration for her and she is not really able to get it to heal as quickly and effectively as she would like to see. The patient also tells me that she has been tolerating the dressing changes without complication. She has just been using over-the-counter  antibiotic ointment and try to keep it covered with a dry dressing at times depending on what she was told to do. Nonetheless right now she is also been on Keflex that did not really seem to help she was switched over to Augmentin she does started that on Friday. Currently I do not see anything that seems to be obviously infected but nonetheless I think it is fine for her to take the Augmentin just to make sure that we do not get anything that initiates in the interim. The patient is currently undergoing chemotherapy she has had previous radiation. Nonetheless in regard to the chemotherapy this could be affecting her ability to heal to some degree. 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 She has hypertension, neuropathy associated with the chemotherapy, and a history of heart disease. She is currently again undergoing active chemotherapy for recurrent breast cancer. 04/21/2021 upon evaluation today patient appears to be doing well with regard to her leg ulcer. This is actually looking significantly improved. There does not appear to be any signs of infection visually and I am happy in that regard. Fortunately there is no signs of systemic infection either which is also great news. No fevers, chills, nausea, vomiting, or diarrhea. 05/05/2021 upon evaluation today patient appears to be doing well with regard to her leg ulcer. She has been tolerating the dressing changes without complication. Fortunately there does not appear to be any signs of infection which is great news. No fevers, chills, nausea, vomiting, or diarrhea. Electronic Signature(s) Signed: 05/05/2021 9:04:21 AM By: Worthy Keeler PA-C Entered By: Worthy Keeler on 05/05/2021 09:04:21 -------------------------------------------------------------------------------- Physical Exam Details Patient Name: Date of Service: [name], [name] 05/05/2021 8:15 A M Medical Record Number: [id number] Patient Account Number: [id number] Date of Birth/Sex: Treating RN: 05-19-1951 (70 y.o. [name] Riley Primary Care Provider: [name] Other Clinician: Referring Provider: Treating Provider/Extender: Vickii Penna in Treatment: 3 Constitutional Well-nourished and well-hydrated in no acute distress. Respiratory normal breathing without difficulty. Psychiatric this patient is able to make decisions and demonstrates good insight into disease process. Alert and Oriented x 3. pleasant and cooperative. Notes Upon inspection patient's wound bed showed signs of good granulation epithelization at this point. There does not appear to be any evidence of infection which is great news and overall I am extremely pleased with where things stand today. Electronic Signature(s) Signed: 05/05/2021 9:04:43 AM By: Worthy Keeler PA-C Entered By: Worthy Keeler on 05/05/2021 09:04:42 -------------------------------------------------------------------------------- Physician Orders Details Patient Name: Date of Service: [name], [name] 05/05/2021 8:15 A M Medical Record Number: [id number] Patient Account Number: [id number] Date of Birth/Sex: Treating RN: 12/19/1950 (70 y.o. 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 Bathing/ Shower/ Hygiene May shower with protection but do not get wound dressing(s) wet. Edema Control - Lymphedema / SCD / Other Right Lower Extremity Elevate legs to the level of the heart or above for 30 minutes daily and/or when sitting, a frequency of: - 3-4 times per day Avoid standing for long periods of time. Exercise regularly Wound Treatment Wound #1 - Lower Leg Wound Laterality: Right, Anterior Peri-Wound Care: Sween Lotion (Moisturizing lotion) 1 x Per Week/30 Days Discharge Instructions: Apply moisturizing lotion to lower leg Prim Dressing: Hydrofera Blue Classic Foam, 2x2 in 1 x Per Week/30 Days ary Discharge Instructions: Moisten with saline prior to applying to wound bed Secondary Dressing: Woven Gauze Sponge, Non-Sterile 4x4 in 1 x Per Week/30 Days Discharge Instructions: Apply over primary dressing as directed. Compression Wrap: ThreePress (3 layer compression wrap) 1 x Per Week/30 Days Discharge Instructions: Apply three layer compression as directed. Electronic Signature(s) Signed: 05/05/2021 5:20:22 PM By: Worthy Keeler PA-C Signed: 05/05/2021 6:23:27 PM By: Baruch Gouty RN, BSN Entered By: Baruch Gouty on 05/05/2021 08:20:15 -------------------------------------------------------------------------------- Problem List Details Patient Name: Date of Service: [place], [name] 05/05/2021 8:15 A M Medical Record Number: [id number] Patient Account Number: [id number] Date of Birth/Sex: Treating RN: 08/16/1951 (70 y.o. 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 04/14/2021 No Yes C79.81 Secondary malignant neoplasm of breast 04/14/2021 No Yes I10 Essential (primary) hypertension 04/14/2021 No Yes I25.10 Atherosclerotic heart disease of native coronary artery without angina pectoris 04/14/2021  No Yes Z92.21 Personal history of antineoplastic chemotherapy 04/14/2021 No Yes Inactive Problems Resolved Problems Electronic Signature(s) Signed: 05/05/2021 5:20:22 PM By: Worthy Keeler PA-C Entered By: Worthy Keeler on 05/05/2021 08:08:37 -------------------------------------------------------------------------------- Progress Note Details Patient Name: Date of Service: [place], [name] 05/05/2021 8:15 A M Medical Record Number: [id number] Patient Account Number: [id number] Date of Birth/Sex: Treating RN: 04-16-1951 (70 y.o. [name] Riley Primary Care Provider: [name] Other Clinician: Referring Provider: Treating Provider/Extender: Vickii Penna in Treatment: 3 Subjective Chief Complaint Information obtained from Patient Right LE Ulcer History of Present Illness (HPI) 04/14/2021 upon evaluation today patient presents for initial inspection here in the clinic concerning issues that she has been having with a wound on her right anterior lower leg after she had an abrasion trauma 4 weeks ago. She tells me that unfortunately following this trauma she has been experiencing quite a bit of discomfort at times with some burning and stinging. She is also had a scab up once or twice but states that right now she has been unable to really get it to scab back over. This has been a frustration for her and she is not really able to get it to heal as quickly and effectively as she would  like to see. The patient also tells me that she has been tolerating the dressing changes without complication. She has just been using over-the-counter antibiotic ointment and try to keep it covered with a dry dressing at times depending on what she was told to do. Nonetheless right now she is also been on Keflex that did not really seem to help she was switched over to Augmentin she does started that on Friday. Currently I do not see anything that seems to be obviously infected but nonetheless I think it is fine for her to take the Augmentin just to make sure that we do not get anything that initiates in the interim. The patient is currently undergoing chemotherapy she has had previous radiation. Nonetheless in regard to the chemotherapy this could be affecting her ability to heal to some degree. 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 She has hypertension, neuropathy associated with the chemotherapy, and a history of heart disease. She is currently again undergoing active chemotherapy for recurrent breast cancer. 04/21/2021 upon evaluation today patient appears to be doing well with regard to her leg ulcer. This is actually looking significantly improved. There does not appear to be any signs of infection visually and I am happy in that regard. Fortunately there is no signs of systemic infection either which is also great news. No fevers, chills, nausea, vomiting, or diarrhea. 05/05/2021 upon evaluation today patient appears to be doing well with regard to her leg ulcer. She has been tolerating the dressing changes without complication. Fortunately there does not appear to be any signs of infection which is great news. No fevers, chills, nausea, vomiting, or diarrhea. Objective Constitutional Well-nourished and well-hydrated in no acute distress. Vitals Time Taken: 7:52 AM, Height: 68 in,  Weight: 157 lbs, BMI: 23.9, Temperature: 98.3 F, Pulse: 80 bpm, Respiratory Rate: 18 breaths/min, Blood Pressure: 131/76 mmHg. Respiratory normal breathing without difficulty. Psychiatric this patient is able to make decisions and demonstrates good insight into disease process. Alert and Oriented x 3. pleasant and cooperative. General Notes: Upon inspection patient's wound bed showed signs of good granulation epithelization at this  point. There does not appear to be any evidence of infection which is great news and overall I am extremely pleased with where things stand today. Integumentary (Hair, Skin) Wound #1 status is Open. Original cause of wound was Trauma. The date acquired was: 03/17/2021. The wound has been in treatment 3 weeks. The wound is located on the Right,Anterior Lower Leg. The wound measures 1.1cm length x 1.1cm width x 0.2cm depth; 0.95cm^2 area and 0.19cm^3 volume. There is Fat Layer (Subcutaneous Tissue) exposed. There is no tunneling or undermining noted. There is a medium amount of serosanguineous drainage noted. The wound margin is distinct with the outline attached to the wound base. There is medium (34-66%) red, pink granulation within the wound bed. There is a medium (34-66%) amount of necrotic tissue within the wound bed including Adherent Slough. 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 There was a Three Layer Compression Therapy  Procedure by Deon Pilling, RN. Post procedure Diagnosis Wound #1: Same as Pre-Procedure Plan Follow-up Appointments: Return Appointment in 1 week. Bathing/ Shower/ Hygiene: May shower with protection but do not get wound dressing(s) wet. Edema Control - Lymphedema / SCD / Other: Elevate legs to the level of the heart or above for 30 minutes daily and/or when sitting, a frequency of: - 3-4 times per day Avoid standing for long periods of time. Exercise regularly WOUND #1: - Lower Leg Wound Laterality: Right, Anterior Peri-Wound Care: Sween Lotion (Moisturizing lotion) 1 x Per Week/30 Days Discharge Instructions: Apply moisturizing lotion to lower leg Prim Dressing: Hydrofera Blue Classic Foam, 2x2 in 1 x Per Week/30 Days ary Discharge Instructions: Moisten with saline prior to applying to wound bed Secondary Dressing: Woven Gauze Sponge, Non-Sterile 4x4 in 1 x Per Week/30 Days Discharge Instructions: Apply over primary dressing as directed. Com pression Wrap: ThreePress (3 layer compression wrap) 1 x Per Week/30 Days Discharge Instructions: Apply three layer compression as directed. 1. Would recommend currently that we going continue with the [facility] which I think is doing a good job. 2. I am also can recommend the patient continue with the 3 layer compression wrap which also seems to be doing well for her. We will see patient back for reevaluation in 1 week here in the clinic. If anything worsens or changes patient will contact our office for additional recommendations. Electronic Signature(s) Signed: 05/05/2021 9:05:00 AM By: Worthy Keeler PA-C Entered By: Worthy Keeler on 05/05/2021 09:04:59 -------------------------------------------------------------------------------- SuperBill Details Patient Name: Date of Service: [place], [name] 05/05/2021 Medical Record Number: [id number] Patient Account Number: [id number] Date of Birth/Sex: Treating RN: 1951-04-13 (70 y.o. 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 C79.81 Secondary malignant neoplasm of breast I10 Essential (primary) hypertension I25.10 Atherosclerotic heart disease of native coronary artery without angina pectoris  Z92.21 Personal history of antineoplastic chemotherapy Facility Procedures CPT4 Code: [id number] Description: (Facility Use Only) [phone] - [place] COMPRS LWR RT LEG Modifier: Quantity: 1 Physician Procedures : CPT4 Code Description Modifier [id number] - WC PHYS LEVEL 3 - EST PT ICD-10 Diagnosis Description S80.811A Abrasion, right lower leg, initial encounter I87.2 Venous insufficiency (chronic) (peripheral) L97.812 Non-pressure chronic ulcer of other  part of right lower leg with fat layer exposed 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

## 2021-05-06 NOTE — Progress Notes (Signed)
[name], Riley ([id number]) Visit Report for 05/05/2021 Arrival Information Details Patient Name: Date of Service: [name] Riley, [name] Riley 05/05/2021 8:15 Riley M Medical Record Number: [id number] Patient Account Number: [id number] Date of Birth/Sex: Treating RN: July 07, 1951 (70 y.o. [name] Primary Care Arleene Settle: [name] Other Clinician: Referring Nikolis Berent: Treating Santhosh Gulino/Extender: Vickii Penna in Treatment: 3 Visit Information History Since Last Visit Added or deleted any medications: No Patient Arrived: Ambulatory Any new allergies or adverse reactions: No Arrival Time: 07:51 Had Riley fall or experienced change in No Transfer Assistance: None activities of daily living that may affect Patient Identification Verified: Yes risk of falls: Secondary Verification Process Completed: Yes Signs or symptoms of abuse/neglect since last visito No Patient Requires Transmission-Based Precautions: No Hospitalized since last visit: No Patient Has Alerts: No Implantable device outside of the clinic excluding No cellular tissue based products placed in the center since last visit: Has Dressing in Place as Prescribed: Yes Has Compression in Place as Prescribed: Yes Pain Present Now: No Electronic Signature(s) Signed: 05/05/2021 5:31:35 PM By: [name] Entered By: [name] on 05/05/2021 07:52:12 -------------------------------------------------------------------------------- Compression Therapy Details Patient Name: Date of Service: [name], [name]. 05/05/2021 8:15 Riley M Medical Record Number: [id number] Patient Account Number: [id number] Date of Birth/Sex: Treating RN: [birth year] (70 y.o. [name] Primary Care Jabori Henegar: [name] Other Clinician: Referring Tor Tsuda: Treating Wenona [place]ville/Extender: Vickii Penna in Treatment: 3 Compression Therapy Performed for Wound Assessment: Wound #1 Right,Anterior Lower  Leg Performed By: Clinician Deon Pilling, RN Compression Type: Three Layer Post Procedure Diagnosis Same as Pre-procedure Electronic Signature(s) Signed: 05/05/2021 6:23:27 PM By: Baruch Gouty RN, BSN Entered By: Baruch Gouty on 05/05/2021 08:19:25 -------------------------------------------------------------------------------- Encounter Discharge Information Details Patient Name: Date of Service: [name], [name]. 05/05/2021 8:15 Riley M Medical Record Number: [id number] Patient Account Number: [id number] Date of Birth/Sex: Treating RN: [birth year] (70 y.o. [name] Primary Care Nealy Karapetian: [name] Other Clinician: Referring Rozena Fierro: Treating Sophiarose Eades/Extender: Vickii Penna in Treatment: 3 Encounter Discharge Information Items Discharge Condition: Stable Ambulatory Status: Ambulatory Discharge Destination: Home Transportation: Private Auto Accompanied By: self Schedule Follow-up Appointment: Yes Clinical Summary of Care: Electronic Signature(s) Signed: 05/05/2021 8:33:22 AM By: Deon Pilling Entered By: Deon Pilling on 05/05/2021 08:33:02 -------------------------------------------------------------------------------- Lower Extremity Assessment Details Patient Name: Date of Service: [name], ORCHARD Riley. 05/05/2021 8:15 Riley M Medical Record Number: [id number] Patient Account Number: [id number] Date of Birth/Sex: Treating RN: Feb 28, 1951 (70 y.o. [name] Primary Care Anniya Whiters: [name] Other Clinician: Referring Shaelynn Dragos: Treating Yailin Biederman/Extender: Vickii Penna in Treatment: 3 Edema Assessment Assessed: [Left: Yes] [Right: No] Edema: [Left: N] [Right: o] Calf Left: Right: Point of Measurement: 29 cm From Medial Instep 33.3 cm Ankle Left: Right: Point of Measurement: 10 cm From Medial Instep 21 cm Vascular Assessment Pulses: Dorsalis Pedis Palpable: [Left:Yes] Electronic  Signature(s) Signed: 05/05/2021 5:31:35 PM By: [name] Entered By: [name] on 05/05/2021 07:52:45 -------------------------------------------------------------------------------- Multi-Disciplinary Care Plan Details Patient Name: Date of Service: [name], [name]. 05/05/2021 8:15 Riley M Medical Record Number: [id number] Patient Account Number: [id number] Date of Birth/Sex: Treating RN: [birth year] (70 y.o. [name] Primary Care Zebedee Segundo: [name] Other Clinician: Referring Boyde Grieco: Treating Rateel Beldin/Extender: Vickii Penna in Treatment: 3 Multidisciplinary Care Plan reviewed with physician Active Inactive Abuse / Safety / Falls / Self Care Management Nursing Diagnoses: Potential for falls Goals:  Patient/caregiver will verbalize/demonstrate measures taken to prevent injury and/or falls Date Initiated: 04/14/2021 Target Resolution Date: 05/12/2021 Goal Status: Active Interventions: Assess fall risk on admission and as needed Notes: Venous Leg Ulcer Nursing Diagnoses: Knowledge deficit related to disease process and management Potential for venous Insuffiency (use before diagnosis confirmed) Goals: Patient will maintain optimal edema control Date Initiated: 04/14/2021 Target Resolution Date: 05/12/2021 Goal Status: Active Interventions: Assess peripheral edema status every visit. Compression as ordered Provide education on venous insufficiency Treatment Activities: Therapeutic compression applied : 04/14/2021 Notes: Wound/Skin Impairment Nursing Diagnoses: Impaired tissue integrity Knowledge deficit related to ulceration/compromised skin integrity Goals: Patient/caregiver will verbalize understanding of skin care regimen Date Initiated: 04/14/2021 Target Resolution Date: 05/12/2021 Goal Status: Active Ulcer/skin breakdown will have Riley volume reduction of 30% by week 4 Date Initiated: 04/14/2021 Target Resolution Date:  05/12/2021 Goal Status: Active Interventions: Assess patient/caregiver ability to obtain necessary supplies Assess patient/caregiver ability to perform ulcer/skin care regimen upon admission and as needed Assess ulceration(s) every visit Provide education on ulcer and skin care Treatment Activities: Skin care regimen initiated : 04/14/2021 Topical wound management initiated : 04/14/2021 Notes: Electronic Signature(s) Signed: 05/05/2021 6:23:27 PM By: Baruch Gouty RN, BSN Entered By: Baruch Gouty on 05/05/2021 08:02:32 -------------------------------------------------------------------------------- Pain Assessment Details Patient Name: Date of Service: [place], [name]. 05/05/2021 8:15 Riley M Medical Record Number: [id number] Patient Account Number: [id number] Date of Birth/Sex: Treating RN: 06-Jan-1951 (70 y.o. [name] Primary Care Yvon Mccord: [name] Other Clinician: Referring Ameri Cahoon: Treating Rommie Dunn/Extender: Vickii Penna in Treatment: 3 Active Problems Location of Pain Severity and Description of Pain Patient Has Paino No Site Locations Pain Management and Medication Current Pain Management: Electronic Signature(s) Signed: 05/05/2021 5:31:35 PM By: [name] Entered By: [name] on 05/05/2021 07:53:44 -------------------------------------------------------------------------------- Patient/Caregiver Education Details Patient Name: Date of Service: [place], [name]. 7/20/2022andnbsp8:15 [place] Record Number: [id number] Patient Account Number: [id number] Date of Birth/Gender: Treating RN: Nov 18, 1950 (70 y.o. [name] Primary Care Physician: [name] Other Clinician: Referring Physician: Treating Physician/Extender: Vickii Penna in Treatment: 3 Education Assessment Education Provided To: Patient Education Topics Provided Venous: Methods: Explain/Verbal Responses:  Reinforcements needed, State content correctly Wound/Skin Impairment: Methods: Explain/Verbal Responses: Reinforcements needed, State content correctly Electronic Signature(s) Signed: 05/05/2021 6:23:27 PM By: Baruch Gouty RN, BSN Entered By: Baruch Gouty on 05/05/2021 08:02:53 -------------------------------------------------------------------------------- Wound Assessment Details Patient Name: Date of Service: [place], [name]. 05/05/2021 8:15 Riley M Medical Record Number: [id number] Patient Account Number: [id number] Date of Birth/Sex: Treating RN: [birth year] (70 y.o. [name] Primary Care Rachell Druckenmiller: [name] Other Clinician: Referring Giavanni Odonovan: Treating Lessie Manigo/Extender: Vickii Penna in Treatment: 3 Wound Status Wound Number: 1 Primary Trauma, Other Etiology: Wound Location: Right, Anterior Lower Leg Wound Open Wounding Event: Trauma Status: Date Acquired: 03/17/2021 Comorbid Coronary Artery Disease, Hypertension, Myocardial Infarction, Weeks Of Treatment: 3 History: Neuropathy, Received Chemotherapy, Received Radiation Clustered Wound: No Wound Measurements Length: (cm) 1.1 Width: (cm) 1.1 Depth: (cm) 0.2 Area: (cm) 0.95 Volume: (cm) 0.19 % Reduction in Area: 42.4% % Reduction in Volume: -15.2% Epithelialization: Small (1-33%) Tunneling: No Undermining: No Wound Description Classification: Full Thickness Without Exposed Support Structures Wound Margin: Distinct, outline attached Exudate Amount: Medium Exudate Type: Serosanguineous Exudate Color: red, brown Foul Odor After Cleansing: No Slough/Fibrino Yes Wound Bed Granulation Amount: Medium (34-66%) Exposed Structure Granulation Quality: Red, Pink Fascia Exposed: No Necrotic Amount: Medium (34-66%) Fat Layer (Subcutaneous Tissue)  Exposed: Yes Necrotic Quality: Adherent Slough Tendon Exposed: No Muscle Exposed: No Joint Exposed: No Bone Exposed: No Treatment  Notes Wound #1 (Lower Leg) Wound Laterality: Right, Anterior Cleanser Peri-Wound Care Sween Lotion (Moisturizing lotion) Discharge Instruction: Apply moisturizing lotion to lower leg Topical Primary Dressing Hydrofera Blue Classic Foam, 2x2 in Discharge Instruction: Moisten with saline prior to applying to wound bed Secondary Dressing Woven Gauze Sponge, Non-Sterile 4x4 in Discharge Instruction: Apply over primary dressing as directed. Secured With Compression Wrap ThreePress (3 layer compression wrap) Discharge Instruction: Apply three layer compression as directed. Compression Stockings Add-Ons Electronic Signature(s) Signed: 05/05/2021 5:31:35 PM By: [name] Entered By: [name] on 05/05/2021 07:54:21 -------------------------------------------------------------------------------- Vitals Details Patient Name: Date of Service: [place], [name]. 05/05/2021 8:15 Riley M Medical Record Number: [id number] Patient Account Number: [id number] Date of Birth/Sex: Treating RN: 12-May-1951 (70 y.o. [name] Primary Care Nykayla Marcelli: [name] Other Clinician: Referring Orlanda Lemmerman: Treating Yitzchok Carriger/Extender: Vickii Penna in Treatment: 3 Vital Signs Time Taken: 07:52 Temperature (F): 98.3 Height (in): 68 Pulse (bpm): 80 Weight (lbs): 157 Respiratory Rate (breaths/min): 18 Body Mass Index (BMI): 23.9 Blood Pressure (mmHg): 131/76 Reference Range: 80 - 120 mg / dl Electronic Signature(s) Signed: 05/05/2021 5:31:35 PM By: [name] Entered By: [name] on 05/05/2021 07:53:35

## 2021-05-07 ENCOUNTER — Other Ambulatory Visit: Payer: Self-pay | Admitting: Hematology and Oncology

## 2021-05-12 ENCOUNTER — Other Ambulatory Visit: Payer: Self-pay

## 2021-05-12 ENCOUNTER — Encounter (HOSPITAL_BASED_OUTPATIENT_CLINIC_OR_DEPARTMENT_OTHER): Payer: Medicare Other | Admitting: Physician Assistant

## 2021-05-12 ENCOUNTER — Other Ambulatory Visit (HOSPITAL_COMMUNITY): Payer: Self-pay

## 2021-05-12 ENCOUNTER — Ambulatory Visit (HOSPITAL_COMMUNITY): Payer: Medicare Other

## 2021-05-12 DIAGNOSIS — S80811A Abrasion, right lower leg, initial encounter: Secondary | ICD-10-CM | POA: Diagnosis not present

## 2021-05-12 NOTE — Progress Notes (Addendum)
ELAINA, TAING ([id number]) Visit Report for 05/12/2021 Chief Complaint Document Details Patient Name: Date of Service: ROLAND, BLADOW A. 05/12/2021 8:00 A M Medical Record Number: [id number] Patient Account Number: [id number] Date of Birth/Sex: Treating RN: 01-29-1951 (70 y.o. [name] Primary Care Provider: [name] Other Clinician: Referring Provider: Treating Provider/Extender: Vickii Penna in Treatment: 4 Information Obtained from: Patient Chief Complaint Right LE Ulcer Electronic Signature(s) Signed: 05/12/2021 8:17:32 AM By: Worthy Keeler PA-C Entered By: Worthy Keeler on 05/12/2021 08:17:31 -------------------------------------------------------------------------------- HPI Details Patient Name: Date of Service: [place], [name] 05/12/2021 8:00 A M Medical Record Number: [id number] Patient Account Number: [id number] Date of Birth/Sex: Treating RN: [birth year] (70 y.o. [name] Primary Care Provider: [name] Other Clinician: Referring Provider: Treating Provider/Extender: Vickii Penna in Treatment: 4 History of Present Illness HPI Description: 04/14/2021 upon evaluation today patient presents for initial inspection here in the clinic concerning issues that she has been having with a wound on her right anterior lower leg after she had an abrasion trauma 4 weeks ago. She tells me that unfortunately following this trauma she has been experiencing quite a bit of discomfort at times with some burning and stinging. She is also had a scab up once or twice but states that right now she has been unable to really get it to scab back over. This has been a frustration for her and she is not really able to get it to heal as quickly and effectively as she would like to see. The patient also tells me that she has been tolerating the dressing changes without complication. She has just been using over-the-counter  antibiotic ointment and try to keep it covered with a dry dressing at times depending on what she was told to do. Nonetheless right now she is also been on Keflex that did not really seem to help she was switched over to Augmentin she does started that on Friday. Currently I do not see anything that seems to be obviously infected but nonetheless I think it is fine for her to take the Augmentin just to make sure that we do not get anything that initiates in the interim. The patient is currently undergoing chemotherapy she has had previous radiation. Nonetheless in regard to the chemotherapy this could be affecting her ability to heal to some degree. 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 She has hypertension, neuropathy associated with the chemotherapy, and a history of heart disease. She is currently again undergoing active chemotherapy for recurrent breast cancer. 04/21/2021 upon evaluation today patient appears to be doing well with regard to her leg ulcer. This is actually looking significantly improved. There does not appear to be any signs of infection visually and I am happy in that regard. Fortunately there is no signs of systemic infection either which is also great news. No fevers, chills, nausea, vomiting, or diarrhea. 05/05/2021 upon evaluation today patient appears to be doing well with regard to her leg ulcer. She has been tolerating the dressing changes without complication. Fortunately there does not appear to be any signs of infection which is great news. No fevers, chills, nausea, vomiting, or diarrhea. 05/12/2021 upon evaluation today patient actually appears to be making good improvement in general. I am extremely pleased with where things stand today. There is no signs of active infection which  is also great news. Electronic Signature(s) Signed: 05/12/2021 11:55:18 AM By:  Worthy Keeler PA-C Entered By: Worthy Keeler on 05/12/2021 11:55:18 -------------------------------------------------------------------------------- Physical Exam Details Patient Name: Date of Service: [place], [name] 05/12/2021 8:00 A M Medical Record Number: [id number] Patient Account Number: [id number] Date of Birth/Sex: Treating RN: [birth year] (70 y.o. [name] Primary Care Provider: [name] Other Clinician: Referring Provider: Treating Provider/Extender: Vickii Penna in Treatment: 4 Constitutional Well-nourished and well-hydrated in no acute distress. Respiratory normal breathing without difficulty. Psychiatric this patient is able to make decisions and demonstrates good insight into disease process. Alert and Oriented x 3. pleasant and cooperative. Notes Patient's wound bed showed signs of good granulation epithelization at this point. I am extremely pleased with where things stand I think she could potentially on benefit from a switch to collagen but other than that I feel like that she is really doing quite well. Electronic Signature(s) Signed: 05/12/2021 11:55:45 AM By: Worthy Keeler PA-C Entered By: Worthy Keeler on 05/12/2021 11:55:45 -------------------------------------------------------------------------------- Physician Orders Details Patient Name: Date of Service: [place], [name] 05/12/2021 8:00 A M Medical Record Number: [id number] Patient Account Number: [id number] Date of Birth/Sex: Treating RN: 12-Oct-1951 (70 y.o. 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 Bathing/ Shower/ Hygiene May shower with protection but do not get wound dressing(s) wet. Edema Control - Lymphedema / SCD / Other Right Lower Extremity Elevate legs to the level of the heart or above for 30 minutes daily and/or when sitting, a frequency of: - 3-4 times per day Avoid standing for long periods of time. Exercise regularly Additional Orders / Instructions Other: - May remove compression wrap and cover wound with bandaid if need to wear dress shoes for funeral Wound Treatment Wound #1 - Lower Leg Wound Laterality: Right, Anterior Peri-Wound Care: Sween Lotion (Moisturizing lotion) 1 x Per Week/30 Days Discharge Instructions: Apply moisturizing lotion to lower leg Prim Dressing: Promogran Prisma Matrix, 4.34 (sq in) (silver collagen) 1 x Per Week/30 Days ary Discharge Instructions: Moisten collagen with saline or hydrogel Secondary Dressing: Woven Gauze Sponge, Non-Sterile 4x4 in 1 x Per Week/30 Days Discharge Instructions: Apply over primary dressing as directed. Compression Wrap: ThreePress (3 layer compression wrap) 1 x Per Week/30 Days Discharge Instructions: Apply three layer compression as directed. Electronic Signature(s) Signed: 05/12/2021 4:30:28 PM By: Worthy Keeler PA-C Signed: 05/12/2021 5:32:13 PM By: Baruch Gouty RN, BSN Entered By: Baruch Gouty on 05/12/2021 08:31:23 -------------------------------------------------------------------------------- Problem List Details Patient Name: Date of Service: [place], [name] 05/12/2021 8:00 A M Medical Record Number:  [id number] Patient Account Number: [id number] Date of Birth/Sex: Treating RN: Aug 26, 1951 (70 y.o. [name] Primary Care Provider: [name] Other Clinician: Referring Provider: Treating Provider/Extender: Vickii Penna in Treatment: 4 Active Problems ICD-10 Encounter Code Description Active Date MDM Diagnosis S80.811A Abrasion, right lower leg, initial encounter 04/14/2021 No  Yes I87.2 Venous insufficiency (chronic) (peripheral) 04/14/2021 No Yes L97.812 Non-pressure chronic ulcer of other part of right lower leg with fat layer 04/14/2021 No Yes exposed G90.09 Other idiopathic peripheral autonomic neuropathy 04/14/2021 No Yes C79.81 Secondary malignant neoplasm of breast 04/14/2021 No Yes I10 Essential (primary) hypertension 04/14/2021 No Yes I25.10 Atherosclerotic heart disease of native coronary artery without angina pectoris 04/14/2021 No Yes Z92.21 Personal history of antineoplastic chemotherapy 04/14/2021 No Yes Inactive Problems Resolved Problems Electronic Signature(s) Signed: 05/12/2021 8:17:23 AM By: Worthy Keeler PA-C Entered By: Worthy Keeler on 05/12/2021 08:17:23 -------------------------------------------------------------------------------- Progress Note Details Patient Name: Date of Service: [place], [name] 05/12/2021 8:00 A M Medical Record Number: [id number] Patient Account Number: [id number] Date of Birth/Sex: Treating RN: 12/28/1950 (70 y.o. [name] Primary Care Provider: [name] Other Clinician: Referring Provider: Treating Provider/Extender: Vickii Penna in Treatment: 4 Subjective Chief Complaint Information obtained from Patient Right LE Ulcer History of Present Illness (HPI) 04/14/2021 upon evaluation today patient presents for initial inspection here in the clinic concerning issues that she has been having with a wound on her right anterior lower leg after she had an  abrasion trauma 4 weeks ago. She tells me that unfortunately following this trauma she has been experiencing quite a bit of discomfort at times with some burning and stinging. She is also had a scab up once or twice but states that right now she has been unable to really get it to scab back over. This has been a frustration for her and she is not really able to get it to heal as quickly and effectively as she would like to see. The patient also tells me that she has been tolerating the dressing changes without complication. She has just been using over-the-counter antibiotic ointment and try to keep it covered with a dry dressing at times depending on what she was told to do. Nonetheless right now she is also been on Keflex that did not really seem to help she was switched over to Augmentin she does started that on Friday. Currently I do not see anything that seems to be obviously infected but nonetheless I think it is fine for her to take the Augmentin just to make sure that we do not get anything that initiates in the interim. The patient is currently undergoing chemotherapy she has had previous radiation. Nonetheless in regard to the chemotherapy this could be affecting her ability to heal to some degree. 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 She has hypertension, neuropathy associated with the chemotherapy, and a history of heart disease. She is currently again undergoing active chemotherapy for recurrent breast cancer. 04/21/2021 upon evaluation today patient appears to be doing well with regard to her leg ulcer. This is actually looking significantly improved. There does not appear to be any signs of infection visually and I am happy in that regard. Fortunately there is no signs of systemic infection either which is also great news. No fevers, chills, nausea, vomiting, or  diarrhea. 05/05/2021 upon evaluation today patient appears to be doing well with regard to her leg ulcer. She has been tolerating the dressing changes without complication. Fortunately there does not appear to be any signs of infection which is great news. No fevers, chills, nausea, vomiting, or diarrhea. 05/12/2021 upon evaluation today patient actually appears to be making good improvement in general. I  am extremely pleased with where things stand today. There is no signs of active infection which is also great news. Objective Constitutional Well-nourished and well-hydrated in no acute distress. Vitals Time Taken: 7:55 AM, Height: 68 in, Weight: 157 lbs, BMI: 23.9, Temperature: 98.6 F, Pulse: 94 bpm, Respiratory Rate: 16 breaths/min, Blood Pressure: 116/72 mmHg. Respiratory normal breathing without difficulty. Psychiatric this patient is able to make decisions and demonstrates good insight into disease process. Alert and Oriented x 3. pleasant and cooperative. General Notes: Patient's wound bed showed signs of good granulation epithelization at this point. I am extremely pleased with where things stand I think she could potentially on benefit from a switch to collagen but other than that I feel like that she is really doing quite well. Integumentary (Hair, Skin) Wound #1 status is Open. Original cause of wound was Trauma. The date acquired was: 03/17/2021. The wound has been in treatment 4 weeks. The wound is located on the Right,Anterior Lower Leg. The wound measures 1cm length x 1.2cm width x 0.2cm depth; 0.942cm^2 area and 0.188cm^3 volume. There is Fat Layer (Subcutaneous Tissue) exposed. There is no tunneling or undermining noted. There is a medium amount of serosanguineous drainage noted. The wound margin is distinct with the outline attached to the wound base. There is large (67-100%) red, pink granulation within the wound bed. There is a small (1-33%) amount of necrotic tissue within the  wound bed. 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 There was a Three Layer Compression Therapy Procedure by Deon Pilling, RN. Post procedure Diagnosis Wound #1: Same as Pre-Procedure Plan Follow-up Appointments: Return Appointment in 1 week. Bathing/ Shower/ Hygiene: May shower with protection but do not get wound dressing(s) wet. Edema Control - Lymphedema / SCD / Other: Elevate legs to the level of the heart or above for 30 minutes daily and/or when sitting, a frequency of: - 3-4 times per day Avoid standing for long periods of time. Exercise regularly Additional Orders / Instructions: Other: - May remove compression wrap and cover wound with bandaid if need to wear dress shoes for funeral WOUND #1: - Lower Leg Wound Laterality: Right, Anterior Peri-Wound Care: Sween Lotion (Moisturizing lotion) 1 x Per Week/30 Days Discharge Instructions: Apply moisturizing lotion to lower leg Prim Dressing: Promogran Prisma Matrix, 4.34 (sq in) (silver collagen) 1 x Per Week/30 Days ary Discharge Instructions: Moisten collagen with saline or hydrogel Secondary Dressing: Woven Gauze Sponge, Non-Sterile 4x4 in 1 x Per Week/30 Days Discharge Instructions: Apply over primary dressing as directed. Com pression Wrap: ThreePress (3 layer compression wrap) 1 x Per Week/30 Days Discharge Instructions: Apply three layer compression as directed. 1. I am going to recommend  currently that we going continue with the wound care measures as before and the patient is in agreement with the plan. This includes the use of the silver collagen which I think is good to do well for her. 2. I am also can recommend we continue with 3 layer compression wrap which I think is doing a great job. 3. I am also going to recommend patient continue to elevate her  legs much as possible to help with edema control. We will see patient back for reevaluation in 1 week here in the clinic. If anything worsens or changes patient will contact our office for additional recommendations. Electronic Signature(s) Signed: 05/12/2021 11:56:30 AM By: Worthy Keeler PA-C Entered By: Worthy Keeler on 05/12/2021 11:56:30 -------------------------------------------------------------------------------- SuperBill Details Patient Name: Date of Service: [place], [name] 05/12/2021 Medical Record Number: [id number] Patient Account Number: [id number] Date of Birth/Sex: Treating RN: 09-Nov-1950 (70 y.o. 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 C79.81 Secondary malignant neoplasm of breast I10 Essential (primary) hypertension I25.10 Atherosclerotic heart disease of native coronary artery without angina pectoris Z92.21 Personal history of antineoplastic chemotherapy Facility Procedures CPT4 Code: [id number] Description: (Facility Use Only) [phone] - [facility] Modifier: Quantity: 1 Physician Procedures : CPT4 Code Description Modifier [id number] - WC  PHYS LEVEL 3 - EST PT ICD-10 Diagnosis Description S80.811A Abrasion, right lower leg, initial encounter I87.2 Venous insufficiency (chronic) (peripheral) 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

## 2021-05-12 NOTE — Progress Notes (Signed)
KEALA, DRUM ([id number]) Visit Report for 05/12/2021 Arrival Information Details Patient Name: Date of Service: DORMA, ALTMAN A. 05/12/2021 8:00 A M Medical Record Number: [id number] Patient Account Number: [id number] Date of Birth/Sex: Treating RN: 1951-06-13 (70 y.o. Helene Shoe, Tammi Klippel Primary Care Amaurie Wandel: [name] Other Clinician: Referring Finnegan Gatta: Treating Kiyanna Biegler/Extender: Vickii Penna in Treatment: 4 Visit Information History Since Last Visit Added or deleted any medications: No Patient Arrived: Ambulatory Any new allergies or adverse reactions: No Arrival Time: 07:55 Had a fall or experienced change in No Accompanied By: self activities of daily living that may affect Transfer Assistance: None risk of falls: Patient Identification Verified: Yes Signs or symptoms of abuse/neglect since last visito No Secondary Verification Process Completed: Yes Hospitalized since last visit: No Patient Requires Transmission-Based Precautions: No Implantable device outside of the clinic excluding No Patient Has Alerts: No cellular tissue based products placed in the center since last visit: Has Dressing in Place as Prescribed: Yes Has Compression in Place as Prescribed: Yes Pain Present Now: No Electronic Signature(s) Signed: 05/12/2021 5:41:26 PM By: Deon Pilling Entered By: Deon Pilling on 05/12/2021 08:00:26 -------------------------------------------------------------------------------- Compression Therapy Details Patient Name: Date of Service: [place], [name] 05/12/2021 8:00 A M Medical Record Number: [id number] Patient Account Number: [id number] Date of Birth/Sex: Treating RN: [birth year] (70 y.o. [name] Primary Care Kilyn Maragh: [name] Other Clinician: Referring Fabion Gatson: Treating Genise Strack/Extender: Vickii Penna in Treatment: 4 Compression Therapy Performed for Wound Assessment: Wound #1  Right,Anterior Lower Leg Performed By: Clinician Deon Pilling, RN Compression Type: Three Layer Post Procedure Diagnosis Same as Pre-procedure Electronic Signature(s) Signed: 05/12/2021 5:32:13 PM By: Baruch Gouty RN, BSN Entered By: Baruch Gouty on 05/12/2021 28:78:67 -------------------------------------------------------------------------------- Encounter Discharge Information Details Patient Name: Date of Service: [place], [name] 05/12/2021 8:00 A M Medical Record Number: [id number] Patient Account Number: [id number] Date of Birth/Sex: Treating RN: [birth year] (70 y.o. [name] Primary Care Aneshia Jacquet: [name] Other Clinician: Referring Kameisha Malicki: Treating Gaege Sangalang/Extender: Vickii Penna in Treatment: 4 Encounter Discharge Information Items Discharge Condition: Stable Ambulatory Status: Ambulatory Discharge Destination: Home Transportation: Private Auto Accompanied By: self Schedule Follow-up Appointment: Yes Clinical Summary of Care: Patient Declined Electronic Signature(s) Signed: 05/12/2021 5:32:13 PM By: Baruch Gouty RN, BSN Entered By: Baruch Gouty on 05/12/2021 08:47:44 -------------------------------------------------------------------------------- Lower Extremity Assessment Details Patient Name: Date of Service: [place], [name] 05/12/2021 8:00 A M Medical Record Number: [id number] Patient Account Number: [id number] Date of Birth/Sex: Treating RN: October 05, 1951 (70 y.o. [name] Primary Care Marleta Lapierre: [name] Other Clinician: Referring Lourdes Manning: Treating Paralee Pendergrass/Extender: Vickii Penna in Treatment: 4 Edema Assessment Assessed: [Left: Yes] [Right: No] Edema: [Left: N] [Right: o] Calf Left: Right: Point of Measurement: 29 cm From Medial Instep 33.5 cm Ankle Left: Right: Point of Measurement: 10 cm From Medial Instep 23 cm Vascular Assessment Pulses: Dorsalis  Pedis Palpable: [Left:Yes] Electronic Signature(s) Signed: 05/12/2021 5:41:26 PM By: Deon Pilling Entered By: Deon Pilling on 05/12/2021 08:01:05 -------------------------------------------------------------------------------- [place] Details Patient Name: Date of Service: [place], [name] 05/12/2021 8:00 A M Medical Record Number: [id number] Patient Account Number: [id number] Date of Birth/Sex: Treating RN: Jun 20, 1951 (70 y.o. [name] Primary Care Kasen Adduci: [name] Other Clinician: Referring Wylee Dorantes: Treating Jovanka Westgate/Extender: Vickii Penna in Treatment: 4 Multidisciplinary Care Plan reviewed with physician Active Inactive Abuse / Safety / Falls / [place]  Management Nursing Diagnoses: Potential for falls Goals: Patient/caregiver will verbalize/demonstrate measures taken to prevent injury and/or falls Date Initiated: 04/14/2021 Target Resolution Date: 06/09/2021 Goal Status: Active Interventions: Assess fall risk on admission and as needed Notes: Venous Leg Ulcer Nursing Diagnoses: Knowledge deficit related to disease process and management Potential for venous Insuffiency (use before diagnosis confirmed) Goals: Patient will maintain optimal edema control Date Initiated: 04/14/2021 Target Resolution Date: 06/09/2021 Goal Status: Active Interventions: Assess peripheral edema status every visit. Compression as ordered Provide education on venous insufficiency Treatment Activities: Therapeutic compression applied : 04/14/2021 Notes: Wound/Skin Impairment Nursing Diagnoses: Impaired tissue integrity Knowledge deficit related to ulceration/compromised skin integrity Goals: Patient/caregiver will verbalize understanding of skin care regimen Date Initiated: 04/14/2021 Target Resolution Date: 06/09/2021 Goal Status: Active Ulcer/skin breakdown will have a volume reduction of 30% by week 4 Date Initiated:  04/14/2021 Date Inactivated: 05/12/2021 Target Resolution Date: 05/12/2021 Goal Status: Met Ulcer/skin breakdown will have a volume reduction of 50% by week 8 Date Initiated: 05/12/2021 Target Resolution Date: 06/09/2021 Goal Status: Active Interventions: Assess patient/caregiver ability to obtain necessary supplies Assess patient/caregiver ability to perform ulcer/skin care regimen upon admission and as needed Assess ulceration(s) every visit Provide education on ulcer and skin care Treatment Activities: Skin care regimen initiated : 04/14/2021 Topical wound management initiated : 04/14/2021 Notes: Electronic Signature(s) Signed: 05/12/2021 5:32:13 PM By: Baruch Gouty RN, BSN Entered By: Baruch Gouty on 05/12/2021 08:26:54 -------------------------------------------------------------------------------- Pain Assessment Details Patient Name: Date of Service: [place], [name] 05/12/2021 8:00 A M Medical Record Number: [id number] Patient Account Number: [id number] Date of Birth/Sex: Treating RN: 1950-11-19 (70 y.o. [name] Primary Care Oluwadamilola Deliz: [name] Other Clinician: Referring Vannesa Abair: Treating Vollie Brunty/Extender: Vickii Penna in Treatment: 4 Active Problems Location of Pain Severity and Description of Pain Patient Has Paino No Site Locations Rate the pain. Current Pain Level: 0 Pain Management and Medication Current Pain Management: Medication: No Cold Application: No Rest: No Massage: No Activity: No T.E.N.S.: No Heat Application: No Leg drop or elevation: No Is the Current Pain Management Adequate: Adequate How does your wound impact your activities of daily livingo Sleep: No Bathing: No Appetite: No Relationship With Others: No Bladder Continence: No Emotions: No Bowel Continence: No Work: No Toileting: No Drive: No Dressing: No Hobbies: No Electronic Signature(s) Signed: 05/12/2021 5:41:26 PM By: Deon Pilling Entered By: Deon Pilling on 05/12/2021 08:00:56 -------------------------------------------------------------------------------- Patient/Caregiver Education Details Patient Name: Date of Service: [name] 7/27/2022andnbsp8:00 A M Medical Record Number: [id number] Patient Account Number: [id number] Date of Birth/Gender: Treating RN: [birth year] (70 y.o. [name] Primary Care Physician: [name] Other Clinician: Referring Physician: Treating Physician/Extender: Vickii Penna in Treatment: 4 Education Assessment Education Provided To: Patient Education Topics Provided Venous: Methods: Explain/Verbal Responses: Reinforcements needed, State content correctly Wound/Skin Impairment: Methods: Explain/Verbal Responses: Reinforcements needed, State content correctly Electronic Signature(s) Signed: 05/12/2021 5:32:13 PM By: Baruch Gouty RN, BSN Entered By: Baruch Gouty on 05/12/2021 08:27:59 -------------------------------------------------------------------------------- Wound Assessment Details Patient Name: Date of Service: [place], [name] 05/12/2021 8:00 A M Medical Record Number: [id number] Patient Account Number: [id number] Date of Birth/Sex: Treating RN: Aug 26, 1951 (70 y.o. [name] Primary Care Franchesca Veneziano: [name] Other Clinician: Referring Jimya Ciani: Treating Kafi Dotter/Extender: Vickii Penna in Treatment: 4 Wound Status Wound Number: 1 Primary Trauma, Other Etiology: Wound Location: Right, Anterior Lower Leg Wound Open Wounding Event: Trauma Status: Date Acquired: 03/17/2021 Comorbid Coronary Artery  Disease, Hypertension, Myocardial Infarction, Weeks Of Treatment: 4 History: Neuropathy, Received Chemotherapy, Received Radiation Clustered Wound: No Photos Wound Measurements Length: (cm) 1 Width: (cm) 1.2 Depth: (cm) 0.2 Area: (cm) 0.942 Volume: (cm) 0.188 %  Reduction in Area: 42.9% % Reduction in Volume: -13.9% Epithelialization: Small (1-33%) Tunneling: No Undermining: No Wound Description Classification: Full Thickness Without Exposed Support Structures Wound Margin: Distinct, outline attached Exudate Amount: Medium Exudate Type: Serosanguineous Exudate Color: red, brown Foul Odor After Cleansing: No Slough/Fibrino Yes Wound Bed Granulation Amount: Large (67-100%) Exposed Structure Granulation Quality: Red, Pink Fascia Exposed: No Necrotic Amount: Small (1-33%) Fat Layer (Subcutaneous Tissue) Exposed: Yes Tendon Exposed: No Muscle Exposed: No Joint Exposed: No Bone Exposed: No Treatment Notes Wound #1 (Lower Leg) Wound Laterality: Right, Anterior Cleanser Peri-Wound Care Sween Lotion (Moisturizing lotion) Discharge Instruction: Apply moisturizing lotion to lower leg Topical Primary Dressing Promogran Prisma Matrix, 4.34 (sq in) (silver collagen) Discharge Instruction: Moisten collagen with saline or hydrogel Secondary Dressing Woven Gauze Sponge, Non-Sterile 4x4 in Discharge Instruction: Apply over primary dressing as directed. Secured With Compression Wrap ThreePress (3 layer compression wrap) Discharge Instruction: Apply three layer compression as directed. Compression Stockings Add-Ons Electronic Signature(s) Signed: 05/12/2021 5:41:26 PM By: Deon Pilling Entered By: Deon Pilling on 05/12/2021 08:01:32 -------------------------------------------------------------------------------- Vitals Details Patient Name: Date of Service: [place], [name] 05/12/2021 8:00 A M Medical Record Number: [id number] Patient Account Number: [id number] Date of Birth/Sex: Treating RN: 06-13-1951 (70 y.o. Helene Shoe, Tammi Klippel Primary Care Yuvonne Lanahan: [name] Other Clinician: Referring Jlyn Bracamonte: Treating Izaiha Lo/Extender: Vickii Penna in Treatment: 4 Vital Signs Time Taken: 07:55 Temperature (F):  98.6 Height (in): 68 Pulse (bpm): 94 Weight (lbs): 157 Respiratory Rate (breaths/min): 16 Body Mass Index (BMI): 23.9 Blood Pressure (mmHg): 116/72 Reference Range: 80 - 120 mg / dl Electronic Signature(s) Signed: 05/12/2021 5:41:26 PM By: Deon Pilling Entered By: Deon Pilling on 05/12/2021 08:00:47

## 2021-05-13 ENCOUNTER — Other Ambulatory Visit: Payer: Self-pay

## 2021-05-13 ENCOUNTER — Inpatient Hospital Stay: Payer: Medicare Other

## 2021-05-13 ENCOUNTER — Other Ambulatory Visit: Payer: Self-pay | Admitting: Adult Health

## 2021-05-13 DIAGNOSIS — N390 Urinary tract infection, site not specified: Secondary | ICD-10-CM

## 2021-05-13 DIAGNOSIS — C50412 Malignant neoplasm of upper-outer quadrant of left female breast: Secondary | ICD-10-CM

## 2021-05-13 DIAGNOSIS — Z5112 Encounter for antineoplastic immunotherapy: Secondary | ICD-10-CM | POA: Diagnosis not present

## 2021-05-13 DIAGNOSIS — Z170 Estrogen receptor positive status [ER+]: Secondary | ICD-10-CM

## 2021-05-13 LAB — URINALYSIS, COMPLETE (UACMP) WITH MICROSCOPIC
Bilirubin Urine: NEGATIVE
Glucose, UA: NEGATIVE mg/dL
Hgb urine dipstick: NEGATIVE
Ketones, ur: 5 mg/dL — AB
Nitrite: NEGATIVE
Protein, ur: >=300 mg/dL — AB
Specific Gravity, Urine: 1.022 (ref 1.005–1.030)
WBC, UA: >50 WBC/hpf — ABNORMAL HIGH (ref 0–5)
pH: 5 (ref 5.0–8.0)

## 2021-05-13 MED ORDER — SULFAMETHOXAZOLE-TRIMETHOPRIM 800-160 MG PO TABS: 1.0000 | ORAL_TABLET | Freq: Two times a day (BID) | ORAL | 0 refills | Status: DC

## 2021-05-13 NOTE — Progress Notes (Signed)
Pt called and states she finished her course of Macrobid prescribed for UTI, but is still experiencing dysuria and polyuria. Pt states she is not able to come in for appt as her mother is in a SNF and is actively dying. Pt was offered specimen cup to pick up and drop off at cancer center, or lab only appt to obtain urine sample. Pt accepted lab appt for today at 3 PM and states she will call to cancel if need be and r/s for tomorrow.

## 2021-05-14 ENCOUNTER — Telehealth: Payer: Self-pay

## 2021-05-14 NOTE — Telephone Encounter (Signed)
-----   Message from Gardenia Phlegm, NP sent at 05/13/2021  8:47 PM EDT ----- Urine is positive.  Please make sure she has a urine culture sent.  I sent in bactrim ds for her to take.   ----- Message ----- From: Interface, [facility] [place] Sent: 05/13/2021   2:38 PM EDT To: Gardenia Phlegm, NP

## 2021-05-14 NOTE — Telephone Encounter (Signed)
Patient notified, verbalized understanding and reports starting antibiotic.

## 2021-05-15 LAB — URINE CULTURE: Culture: >=100000 — AB

## 2021-05-17 ENCOUNTER — Ambulatory Visit (HOSPITAL_COMMUNITY): Payer: Medicare Other

## 2021-05-17 ENCOUNTER — Telehealth: Payer: Self-pay

## 2021-05-17 NOTE — Telephone Encounter (Signed)
Called to see how she is doing with antibiotics for UTI and to see how she is doing. Unable to leave a message, mailbox full.

## 2021-05-19 ENCOUNTER — Encounter (HOSPITAL_BASED_OUTPATIENT_CLINIC_OR_DEPARTMENT_OTHER): Payer: Medicare Other | Attending: Physician Assistant | Admitting: Physician Assistant

## 2021-05-19 ENCOUNTER — Other Ambulatory Visit: Payer: Self-pay

## 2021-05-19 DIAGNOSIS — L97812 Non-pressure chronic ulcer of other part of right lower leg with fat layer exposed: Secondary | ICD-10-CM | POA: Insufficient documentation

## 2021-05-19 DIAGNOSIS — G9009 Other idiopathic peripheral autonomic neuropathy: Secondary | ICD-10-CM | POA: Insufficient documentation

## 2021-05-19 DIAGNOSIS — Z923 Personal history of irradiation: Secondary | ICD-10-CM | POA: Diagnosis not present

## 2021-05-19 DIAGNOSIS — G629 Polyneuropathy, unspecified: Secondary | ICD-10-CM | POA: Insufficient documentation

## 2021-05-19 DIAGNOSIS — I872 Venous insufficiency (chronic) (peripheral): Secondary | ICD-10-CM | POA: Insufficient documentation

## 2021-05-19 NOTE — Progress Notes (Signed)
AYLLA, HUFFINE ([id number]) Visit Report for 05/19/2021 Arrival Information Details Patient Name: Date of Service: [name] Riley, [name] Riley 05/19/2021 8:45 A M Medical Record Number: [id number] Patient Account Number: [id number] Date of Birth/Sex: Treating RN: 05/13/1951 (70 y.o. [name] Primary Care Mervin Ramires: [name] Other Clinician: Referring Sharmain Lastra: Treating Clema Skousen/Extender: Vickii Penna in Treatment: 5 Visit Information History Since Last Visit Added or deleted any medications: No Patient Arrived: Ambulatory Any new allergies or adverse reactions: No Arrival Time: 08:52 Had a fall or experienced change in Yes Transfer Assistance: None activities of daily living that may affect Patient Identification Verified: Yes risk of falls: Secondary Verification Process Completed: Yes Signs or symptoms of abuse/neglect since last visito No Patient Requires Transmission-Based Precautions: No Hospitalized since last visit: No Patient Has Alerts: No Implantable device outside of the clinic excluding No cellular tissue based products placed in the center since last visit: Has Dressing in Place as Prescribed: Yes Has Compression in Place as Prescribed: Yes Pain Present Now: No Electronic Signature(s) Signed: 05/19/2021 9:10:32 AM By: [name] Entered By: [name] on 05/19/2021 09:10:32 -------------------------------------------------------------------------------- Compression Therapy Details Patient Name: Date of Service: [name] Riley, [name] 05/19/2021 8:45 A M Medical Record Number: [id number] Patient Account Number: [id number] Date of Birth/Sex: Treating RN: 01/04/1951 (70 y.o. [name] Primary Care Jadyn Brasher: [name] Other Clinician: Referring Simara Rhyner: Treating Mayte Diers/Extender: Vickii Penna in Treatment: 5 Compression Therapy Performed for Wound Assessment: Wound #1 Right,Anterior Lower  Leg Performed By: Clinician Deon Pilling, RN Compression Type: Three Layer Post Procedure Diagnosis Same as Pre-procedure Electronic Signature(s) Signed: 05/19/2021 5:57:23 PM By: Baruch Gouty RN, BSN Entered By: Baruch Gouty on 05/19/2021 09:42:19 -------------------------------------------------------------------------------- Encounter Discharge Information Details Patient Name: Date of Service: [name] Riley, [name] 05/19/2021 8:45 A M Medical Record Number: [id number] Patient Account Number: [id number] Date of Birth/Sex: Treating RN: 1950/10/31 (70 y.o. [name]: [name] Other Clinician: Referring Shanzay Hepworth: Treating Brooklen Runquist/Extender: Vickii Penna in Treatment: 5 Encounter Discharge Information Items Discharge Condition: Stable Ambulatory Status: Ambulatory Discharge Destination: Home Transportation: Private Auto Accompanied By: self Schedule Follow-up Appointment: Yes Clinical Summary of Care: Electronic Signature(s) Signed: 05/19/2021 4:43:23 PM By: Deon Pilling Entered By: Deon Pilling on 05/19/2021 10:00:45 -------------------------------------------------------------------------------- Lower Extremity Assessment Details Patient Name: Date of Service: [name] Riley, [name] A. 05/19/2021 8:45 A M Medical Record Number: [id number] Patient Account Number: [id number] Date of Birth/Sex: Treating RN: 01-May-1951 (70 y.o. [name] Primary Care Sylwia Cuervo: [name] Other Clinician: Referring Vihaan Gloss: Treating Milik Gilreath/Extender: Vickii Penna in Treatment: 5 Edema Assessment Assessed: [Left: No] [Right: Yes] Edema: [Left: Ye] [Right: s] Calf Left: Right: Point of Measurement: 29 cm From Medial Instep 35 cm Ankle Left: Right: Point of Measurement: 10 cm From Medial Instep 21 cm Vascular Assessment Pulses: Dorsalis Pedis Palpable: [Right:Yes] Electronic Signature(s) Signed:  05/19/2021 5:27:26 PM By: [name] Entered By: [name] on 05/19/2021 08:59:37 -------------------------------------------------------------------------------- Multi-Disciplinary Care Plan Details Patient Name: Date of Service: [name] Riley, [name] 05/19/2021 8:45 A M Medical Record Number: [id number] Patient Account Number: [id number] Date of Birth/Sex: Treating RN: Jun 15, 1951 (70 y.o. [name]: [name] Other Clinician: Referring Edwardine Deschepper: Treating Trevaun Rendleman/Extender: Vickii Penna in Treatment: 5 Multidisciplinary Care Plan reviewed with physician Active Inactive Abuse / Safety / Falls / Self Care Management Nursing Diagnoses: Potential for falls Goals:  Patient/caregiver will verbalize/demonstrate measures taken to prevent injury and/or falls Date Initiated: 04/14/2021 Target Resolution Date: 06/09/2021 Goal Status: Active Interventions: Assess fall risk on admission and as needed Notes: 05/19/21 pt fell yesterday and bumped leg Venous Leg Ulcer Nursing Diagnoses: Knowledge deficit related to disease process and management Potential for venous Insuffiency (use before diagnosis confirmed) Goals: Patient will maintain optimal edema control Date Initiated: 04/14/2021 Target Resolution Date: 06/09/2021 Goal Status: Active Interventions: Assess peripheral edema status every visit. Compression as ordered Provide education on venous insufficiency Treatment Activities: Therapeutic compression applied : 04/14/2021 Notes: Wound/Skin Impairment Nursing Diagnoses: Impaired tissue integrity Knowledge deficit related to ulceration/compromised skin integrity Goals: Patient/caregiver will verbalize understanding of skin care regimen Date Initiated: 04/14/2021 Target Resolution Date: 06/09/2021 Goal Status: Active Ulcer/skin breakdown will have a volume reduction of 30% by week 4 Date Initiated: 04/14/2021 Date  Inactivated: 05/12/2021 Target Resolution Date: 05/12/2021 Goal Status: Met Ulcer/skin breakdown will have a volume reduction of 50% by week 8 Date Initiated: 05/12/2021 Target Resolution Date: 06/09/2021 Goal Status: Active Interventions: Assess patient/caregiver ability to obtain necessary supplies Assess patient/caregiver ability to perform ulcer/skin care regimen upon admission and as needed Assess ulceration(s) every visit Provide education on ulcer and skin care Treatment Activities: Skin care regimen initiated : 04/14/2021 Topical wound management initiated : 04/14/2021 Notes: Electronic Signature(s) Signed: 05/19/2021 5:57:23 PM By: Baruch Gouty RN, BSN Entered By: Baruch Gouty on 05/19/2021 09:41:14 -------------------------------------------------------------------------------- Pain Assessment Details Patient Name: Date of Service: Rubi, [name] 05/19/2021 8:45 A M Medical Record Number: [id number] Patient Account Number: [id number] Date of Birth/Sex: Treating RN: [birth year] (70 y.o. [name] Primary Care Allen Basista: [name] Other Clinician: Referring Delano Scardino: Treating Constant Mandeville/Extender: Vickii Penna in Treatment: 5 Active Problems Location of Pain Severity and Description of Pain Patient Has Paino No Site Locations Pain Management and Medication Current Pain Management: Electronic Signature(s) Signed: 05/19/2021 5:27:26 PM By: [name] Entered By: [name] on 05/19/2021 08:53:37 -------------------------------------------------------------------------------- Patient/Caregiver Education Details Patient Name: Date of Service: [name] Riley, [name] 8/3/2022andnbsp8:45 [place] Record Number: [id number] Patient Account Number: [id number] Date of Birth/Gender: Treating RN: 01-01-1951 (70 y.o. [name] Primary Care Physician: [name] Other Clinician: Referring Physician: Treating  Physician/Extender: Vickii Penna in Treatment: 5 Education Assessment Education Provided To: Patient Education Topics Provided Venous: Methods: Explain/Verbal Responses: Reinforcements needed, State content correctly Electronic Signature(s) Signed: 05/19/2021 5:57:23 PM By: Baruch Gouty RN, BSN Entered By: Baruch Gouty on 05/19/2021 09:41:38 -------------------------------------------------------------------------------- Wound Assessment Details Patient Name: Date of Service: [name] Riley, [name] 05/19/2021 8:45 A M Medical Record Number: [id number] Patient Account Number: [id number] Date of Birth/Sex: Treating RN: 31-Jan-1951 (70 y.o. [name] Primary Care Hong Timm: [name] Other Clinician: Referring Emmalie Haigh: Treating Brianni Manthe/Extender: Vickii Penna in Treatment: 5 Wound Status Wound Number: 1 Primary Trauma, Other Etiology: Wound Location: Right, Anterior Lower Leg Wound Open Wounding Event: Trauma Status: Date Acquired: 03/17/2021 Comorbid Coronary Artery Disease, Hypertension, Myocardial Infarction, Weeks Of Treatment: 5 History: Neuropathy, Received Chemotherapy, Received Radiation Clustered Wound: No Photos Wound Measurements Length: (cm) 0.9 Width: (cm) 1 Depth: (cm) 0.2 Area: (cm) 0.707 Volume: (cm) 0.141 % Reduction in Area: 57.1% % Reduction in Volume: 14.5% Epithelialization: Small (1-33%) Tunneling: No Undermining: No Wound Description Classification: Full Thickness Without Exposed Support Structures Wound Margin: Distinct, outline attached Exudate Amount: Medium Exudate Type: Serosanguineous Exudate Color: red, brown Wound Bed Granulation Amount: Large (67-100%) Granulation  Quality: Red, Pink Necrotic Amount: Small (1-33%) Foul Odor After Cleansing: No Slough/Fibrino Yes Exposed Structure Fascia Exposed: No Fat Layer (Subcutaneous Tissue) Exposed: Yes Tendon Exposed:  No Muscle Exposed: No Joint Exposed: No Bone Exposed: No Treatment Notes Wound #1 (Lower Leg) Wound Laterality: Right, Anterior Cleanser Peri-Wound Care Sween Lotion (Moisturizing lotion) Discharge Instruction: Apply moisturizing lotion to lower leg Topical Primary Dressing Promogran Prisma Matrix, 4.34 (sq in) (silver collagen) Discharge Instruction: Moisten collagen with saline or hydrogel Secondary Dressing Woven Gauze Sponge, Non-Sterile 4x4 in Discharge Instruction: Apply over primary dressing as directed. Secured With Compression Wrap ThreePress (3 layer compression wrap) Discharge Instruction: Apply three layer compression as directed. Compression Stockings Add-Ons Electronic Signature(s) Signed: 05/19/2021 5:27:26 PM By: [name] Entered By: [name] on 05/19/2021 09:01:52 -------------------------------------------------------------------------------- Wound Assessment Details Patient Name: Date of Service: [name] Riley, [name] 05/19/2021 8:45 A M Medical Record Number: [id number] Patient Account Number: [id number] Date of Birth/Sex: Treating RN: [birth year] (70 y.o. [name] Primary Care Tynslee Bowlds: [name] Other Clinician: Referring Kelce Bouton: Treating Zyon Grout/Extender: Vickii Penna in Treatment: 5 Wound Status Wound Number: 2 Primary Trauma, Other Etiology: Wound Location: Right, Lateral Lower Leg Wound Open Wounding Event: Trauma Status: Date Acquired: 05/15/2021 Comorbid Coronary Artery Disease, Hypertension, Myocardial Infarction, Weeks Of Treatment: 0 History: Neuropathy, Received Chemotherapy, Received Radiation Clustered Wound: No Photos Wound Measurements Length: (cm) 0.3 Width: (cm) 1.2 Depth: (cm) 0.1 Area: (cm) 0.283 Volume: (cm) 0.028 % Reduction in Area: % Reduction in Volume: Epithelialization: None Tunneling: No Undermining: No Wound Description Classification: Full Thickness Without  Exposed Support Struct Wound Margin: Distinct, outline attached Exudate Amount: Medium Exudate Type: Sanguinous Exudate Color: red ures Foul Odor After Cleansing: No Slough/Fibrino No Wound Bed Granulation Amount: Large (67-100%) Exposed Structure Granulation Quality: Red Fascia Exposed: No Necrotic Amount: None Present (0%) Fat Layer (Subcutaneous Tissue) Exposed: Yes Tendon Exposed: No Muscle Exposed: No Joint Exposed: No Bone Exposed: No Treatment Notes Wound #2 (Lower Leg) Wound Laterality: Right, Lateral Cleanser Peri-Wound Care Sween Lotion (Moisturizing lotion) Discharge Instruction: Apply moisturizing lotion as directed Topical Primary Dressing Xeroform Occlusive Gauze Dressing, 4x4 in Discharge Instruction: Apply to wound bed as instructed Secondary Dressing Woven Gauze Sponge, Non-Sterile 4x4 in Discharge Instruction: Apply over primary dressing as directed. Secured With Compression Wrap ThreePress (3 layer compression wrap) Discharge Instruction: Apply three layer compression as directed. Compression Stockings Add-Ons Electronic Signature(s) Signed: 05/19/2021 5:27:26 PM By: [name] Entered By: [name] on 05/19/2021 09:05:05 -------------------------------------------------------------------------------- Vitals Details Patient Name: Date of Service: [name] Riley, [name] 05/19/2021 8:45 A M Medical Record Number: [id number] Patient Account Number: [id number] Date of Birth/Sex: Treating RN: 1951-05-03 (70 y.o. [name] Primary Care Malaiyah Achorn: [name] Other Clinician: Referring Hazim Treadway: Treating Emidio Warrell/Extender: Vickii Penna in Treatment: 5 Vital Signs Time Taken: 08:53 Temperature (F): 98.6 Height (in): 68 Pulse (bpm): 86 Weight (lbs): 157 Respiratory Rate (breaths/min): 16 Body Mass Index (BMI): 23.9 Blood Pressure (mmHg): 112/66 Reference Range: 80 - 120 mg / dl Electronic  Signature(s) Signed: 05/19/2021 5:27:26 PM By: [name] Entered By: [name] on 05/19/2021 08:55:44

## 2021-05-19 NOTE — Progress Notes (Addendum)
SHADIE, PAWLOWSKI ([id number]) Visit Report for 05/19/2021 Chief Complaint Document Details Patient Name: Date of Service: SHELLBIE, BENTLEY A. 05/19/2021 8:45 A M Medical Record Number: [id number] Patient Account Number: [id number] Date of Birth/Sex: Treating RN: Dec 28, 1950 (70 y.o. [name] Primary Care Provider: [name] Other Clinician: Referring Provider: Treating Provider/Extender: Vickii Penna in Treatment: 5 Information Obtained from: Patient Chief Complaint Right LE Ulcer Electronic Signature(s) Signed: 05/19/2021 9:13:36 AM By: Worthy Keeler PA-C Entered By: Worthy Keeler on 05/19/2021 09:13:36 -------------------------------------------------------------------------------- HPI Details Patient Name: Date of Service: [place], [name] 05/19/2021 8:45 A M Medical Record Number: [id number] Patient Account Number: [id number] Date of Birth/Sex: Treating RN: Sep 25, 1951 (70 y.o. [name] Primary Care Provider: [name] Other Clinician: Referring Provider: Treating Provider/Extender: Vickii Penna in Treatment: 5 History of Present Illness HPI Description: 04/14/2021 upon evaluation today patient presents for initial inspection here in the clinic concerning issues that she has been having with a wound on her right anterior lower leg after she had an abrasion trauma 4 weeks ago. She tells me that unfortunately following this trauma she has been experiencing quite a bit of discomfort at times with some burning and stinging. She is also had a scab up once or twice but states that right now she has been unable to really get it to scab back over. This has been a frustration for her and she is not really able to get it to heal as quickly and effectively as she would like to see. The patient also tells me that she has been tolerating the dressing changes without complication. She has just been using over-the-counter  antibiotic ointment and try to keep it covered with a dry dressing at times depending on what she was told to do. Nonetheless right now she is also been on Keflex that did not really seem to help she was switched over to Augmentin she does started that on Friday. Currently I do not see anything that seems to be obviously infected but nonetheless I think it is fine for her to take the Augmentin just to make sure that we do not get anything that initiates in the interim. The patient is currently undergoing chemotherapy she has had previous radiation. Nonetheless in regard to the chemotherapy this could be affecting her ability to heal to some degree. 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 She has hypertension, neuropathy associated with the chemotherapy, and a history of heart disease. She is currently again undergoing active chemotherapy for recurrent breast cancer. 04/21/2021 upon evaluation today patient appears to be doing well with regard to her leg ulcer. This is actually looking significantly improved. There does not appear to be any signs of infection visually and I am happy in that regard. Fortunately there is no signs of systemic infection either which is also great news. No fevers, chills, nausea, vomiting, or diarrhea. 05/05/2021 upon evaluation today patient appears to be doing well with regard to her leg ulcer. She has been tolerating the dressing changes without complication. Fortunately there does not appear to be any signs of infection which is great news. No fevers, chills, nausea, vomiting, or diarrhea. 05/12/2021 upon evaluation today patient actually appears to be making good improvement in general. I am extremely pleased with where things stand today. There is no signs of active infection which  is also great news. 05/19/2021 upon evaluation today patient appears to be doing  well with regard to her wound. This is actually showing signs of excellent granulation epithelization at this point. I do not see any evidence of infection which is great and overall I am extremely pleased with where we stand today. In general I think that the patient is making leasing strides towards getting this healed. The issue that she had unfortunately her mother did pass this past week. She was at the wedding yesterday when she actually felt faint coming out of the church there is a lot of emotional situation involved obviously. Nonetheless she somewhat started to pass out her brothers caught her but she did hit her leg laterally underneath the wrap on the side of the steps. This caused a small skin tear and some contusion fortunately this is not too bad but nonetheless I do think that this needs to be monitored currently. Electronic Signature(s) Signed: 05/19/2021 4:37:35 PM By: Worthy Keeler PA-C Entered By: Worthy Keeler on 05/19/2021 16:37:35 -------------------------------------------------------------------------------- Physical Exam Details Patient Name: Date of Service: [place], [name] 05/19/2021 8:45 A M Medical Record Number: [id number] Patient Account Number: [id number] Date of Birth/Sex: Treating RN: 1951/08/31 (70 y.o. [name] Primary Care Provider: [name] Other Clinician: Referring Provider: Treating Provider/Extender: Vickii Penna in Treatment: 5 Constitutional Well-nourished and well-hydrated in no acute distress. Respiratory normal breathing without difficulty. Psychiatric this patient is able to make decisions and demonstrates good insight into disease process. Alert and Oriented x 3. pleasant and cooperative. Notes Upon inspection patient's wound is showing signs of excellent improvement in granulation as well as epithelization. With that being said I do see signs of overall improvement which is great news and I am  very pleased in that regard. With regard to her skin tear this is minimal and I did reapproximate this will use a little bit of Xeroform over top of this just to keep it from sticking. Electronic Signature(s) Signed: 05/19/2021 4:38:20 PM By: Worthy Keeler PA-C Entered By: Worthy Keeler on 05/19/2021 16:38:19 -------------------------------------------------------------------------------- Physician Orders Details Patient Name: Date of Service: [place], [name] 05/19/2021 8:45 A M Medical Record Number: [id number] Patient Account Number: [id number] Date of Birth/Sex: Treating RN: 02-Jan-1951 (70 y.o. 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 Bathing/ Shower/ Hygiene May shower with protection but do not get wound dressing(s) wet. Edema Control - Lymphedema / SCD / Other Right Lower Extremity Elevate legs to the level of the heart or above for 30 minutes daily and/or when sitting, a frequency of: - 3-4 times per day Avoid standing for long periods of time. Exercise regularly Wound Treatment Wound #1 - Lower Leg Wound Laterality: Right, Anterior Peri-Wound Care: Sween Lotion (Moisturizing lotion) 1 x Per Week/30 Days Discharge Instructions: Apply  moisturizing lotion to lower leg Prim Dressing: Promogran Prisma Matrix, 4.34 (sq in) (silver collagen) 1  x Per Week/30 Days ary Discharge Instructions: Moisten collagen with saline or hydrogel Secondary Dressing: Woven Gauze Sponge, Non-Sterile 4x4 in 1 x Per Week/30 Days Discharge Instructions: Apply over primary dressing as directed. Compression Wrap: ThreePress (3 layer compression wrap) 1 x Per Week/30 Days Discharge Instructions: Apply three layer compression as directed. Wound #2 - Lower Leg Wound Laterality: Right, Lateral Peri-Wound Care: Sween Lotion (Moisturizing lotion) 1 x Per Week/30 Days Discharge Instructions: Apply moisturizing lotion as directed Prim Dressing: Xeroform Occlusive Gauze Dressing, 4x4 in 1 x Per Week/30 Days ary Discharge Instructions: Apply to wound bed as instructed Secondary Dressing: Woven Gauze Sponge, Non-Sterile 4x4 in 1 x Per Week/30 Days Discharge Instructions: Apply over primary dressing as directed. Compression Wrap: ThreePress (3 layer compression wrap) 1 x Per Week/30 Days Discharge Instructions: Apply three layer compression as directed. Electronic Signature(s) Signed: 05/19/2021 5:22:07 PM By: Worthy Keeler PA-C Signed: 05/19/2021 5:57:23 PM By: Baruch Gouty RN, BSN Entered By: Baruch Gouty on 05/19/2021 09:43:58 -------------------------------------------------------------------------------- Problem List Details Patient Name: Date of Service: [place], [name] 05/19/2021 8:45 A M Medical Record Number: [id number] Patient Account Number: [id number] Date of Birth/Sex: Treating RN: 06-03-1951 (70 y.o. 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 04/14/2021 No Yes C79.81 Secondary malignant neoplasm of breast 04/14/2021 No Yes I10 Essential (primary) hypertension 04/14/2021 No Yes I25.10 Atherosclerotic heart disease of native coronary artery without angina pectoris 04/14/2021 No Yes Z92.21 Personal history of antineoplastic chemotherapy 04/14/2021 No Yes Inactive Problems Resolved Problems Electronic Signature(s) Signed: 05/19/2021 9:12:55 AM By: Worthy Keeler PA-C Entered By: Worthy Keeler on 05/19/2021 09:12:54 -------------------------------------------------------------------------------- Progress Note Details Patient Name: Date of Service: [place], [name] 05/19/2021 8:45 A M Medical Record Number: [id number] Patient Account Number: [id number] Date of Birth/Sex: Treating RN: 1950-11-05 (70 y.o. [name] Primary Care Provider: [name] Other Clinician: Referring Provider: Treating Provider/Extender: Vickii Penna in Treatment: 5 Subjective Chief Complaint Information obtained from Patient Right LE Ulcer History of Present Illness (HPI) 04/14/2021 upon evaluation today patient presents for initial inspection here in the clinic concerning issues that she has been having with a wound on her right anterior lower leg after she had an abrasion trauma 4 weeks ago. She tells me that unfortunately following this trauma she has been experiencing quite a bit of discomfort at times with some burning and stinging. She is also had a scab up once or twice but states that right now she has been unable to really get it to scab back over. This has been a frustration for her and she is not really able to get it to heal as quickly and effectively as she would like to see. The  patient also tells me that she has been tolerating the dressing changes without complication. She has just been using over-the-counter antibiotic ointment and try to keep it covered with a dry dressing at times depending on what she was told to do. Nonetheless right now she is also been on Keflex that did not really seem to help she was switched over to  Augmentin she does started that on Friday. Currently I do not see anything that seems to be obviously infected but nonetheless I think it is fine for her to take the Augmentin just to make sure that we do not get anything that initiates in the interim. The patient is currently undergoing chemotherapy she has had previous radiation. Nonetheless in regard to the chemotherapy this could be affecting her ability to heal to some degree. 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 She has hypertension, neuropathy associated with the chemotherapy, and a history of heart disease. She is currently again undergoing active chemotherapy for recurrent breast cancer. 04/21/2021 upon evaluation today patient appears to be doing well with regard to her leg ulcer. This is actually looking significantly improved. There does not appear to be any signs of infection visually and I am happy in that regard. Fortunately there is no signs of systemic infection either which is also great news. No fevers, chills, nausea, vomiting, or diarrhea. 05/05/2021 upon evaluation today patient appears to be doing well with regard to her leg ulcer. She has been tolerating the dressing changes without complication. Fortunately there does not appear to be any signs of infection which is great news. No fevers, chills, nausea, vomiting, or diarrhea. 05/12/2021 upon evaluation today patient actually appears to be making good improvement in general. I am extremely pleased with where things  stand today. There is no signs of active infection which is also great news. 05/19/2021 upon evaluation today patient appears to be doing well with regard to her wound. This is actually showing signs of excellent granulation epithelization at this point. I do not see any evidence of infection which is great and overall I am extremely pleased with where we stand today. In general I think that the patient is making leasing strides towards getting this healed. The issue that she had unfortunately her mother did pass this past week. She was at the wedding yesterday when she actually felt faint coming out of the church there is a lot of emotional situation involved obviously. Nonetheless she somewhat started to pass out her brothers caught her but she did hit her leg laterally underneath the wrap on the side of the steps. This caused a small skin tear and some contusion fortunately this is not too bad but nonetheless I do think that this needs to be monitored currently. Objective Constitutional Well-nourished and well-hydrated in no acute distress. Vitals Time Taken: 8:53 AM, Height: 68 in, Weight: 157 lbs, BMI: 23.9, Temperature: 98.6 F, Pulse: 86 bpm, Respiratory Rate: 16 breaths/min, Blood Pressure: 112/66 mmHg. Respiratory normal breathing without difficulty. Psychiatric this patient is able to make decisions and demonstrates good insight into disease process. Alert and Oriented x 3. pleasant and cooperative. General Notes: Upon inspection patient's wound is showing signs of excellent improvement in granulation as well as epithelization. With that being said I do see signs of overall improvement which is great news and I am very pleased in that regard. With regard to her skin tear this is minimal and I did reapproximate this will use a little bit of Xeroform over top of this just to keep it from sticking. Integumentary (Hair, Skin) Wound #1 status is Open. Original cause of wound was Trauma. The  date acquired was: 03/17/2021. The wound has been in treatment 5 weeks. The wound is located on the Right,Anterior Lower Leg. The wound measures  0.9cm length x 1cm width x 0.2cm depth; 0.707cm^2 area and 0.141cm^3 volume. There is Fat Layer (Subcutaneous Tissue) exposed. There is no tunneling or undermining noted. There is a medium amount of serosanguineous drainage noted. The wound margin is distinct with the outline attached to the wound base. There is large (67-100%) red, pink granulation within the wound bed. There is a small (1-33%) amount of necrotic tissue within the wound bed. Wound #2 status is Open. Original cause of wound was Trauma. The date acquired was: 05/15/2021. The wound is located on the Right,Lateral Lower Leg. The wound measures 0.3cm length x 1.2cm width x 0.1cm depth; 0.283cm^2 area and 0.028cm^3 volume. There is Fat Layer (Subcutaneous Tissue) exposed. There is no tunneling or undermining noted. There is a medium amount of sanguinous drainage noted. The wound margin is distinct with the outline attached to the wound base. There is large (67-100%) red granulation within the wound bed. There is no necrotic tissue within the wound bed. 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 There was a Three Layer Compression Therapy Procedure by Deon Pilling, RN. Post procedure Diagnosis Wound #1: Same as Pre-Procedure Plan Follow-up Appointments: Return  Appointment in 1 week. Bathing/ Shower/ Hygiene: May shower with protection but do not get wound dressing(s) wet. Edema Control - Lymphedema / SCD / Other: Elevate legs to the level of the heart or above for 30 minutes daily and/or when sitting, a frequency of: - 3-4 times per day Avoid standing for long periods of time. Exercise regularly WOUND #1: - Lower Leg Wound Laterality: Right, Anterior Peri-Wound Care: Sween Lotion (Moisturizing lotion) 1 x Per Week/30 Days Discharge Instructions: Apply moisturizing lotion to lower leg Prim Dressing: Promogran Prisma Matrix, 4.34 (sq in) (silver collagen) 1 x Per Week/30 Days ary Discharge Instructions: Moisten collagen with saline or hydrogel Secondary Dressing: Woven Gauze Sponge, Non-Sterile 4x4 in 1 x Per Week/30 Days Discharge Instructions: Apply over primary dressing as directed. Com pression Wrap: ThreePress (3 layer compression wrap) 1 x Per Week/30 Days Discharge Instructions: Apply three layer compression as directed. WOUND #2: - Lower Leg Wound Laterality: Right, Lateral Peri-Wound Care: Sween Lotion (Moisturizing lotion) 1 x Per Week/30 Days Discharge Instructions: Apply moisturizing lotion as directed Prim Dressing: Xeroform Occlusive Gauze Dressing, 4x4 in 1 x Per Week/30 Days ary Discharge Instructions: Apply to wound bed as instructed Secondary Dressing: Woven Gauze Sponge, Non-Sterile 4x4 in 1 x Per Week/30 Days Discharge Instructions: Apply over primary dressing as directed. Com pression Wrap: ThreePress (3 layer compression wrap) 1 x Per Week/30 Days Discharge Instructions: Apply three layer compression as directed. 1. Would recommend currently that we go ahead and initiate a continuation of therapy with silver collagen for the main wound patient is in agreement the plan. 2. We will use Xeroform for the new skin tear which is tiny that will keep things from sticking. 3. I am also can recommend the patient continue with the  compression wrap or using a 3 layer compression wrap currently. We will see patient back for reevaluation in 1 week here in the clinic. If anything worsens or  changes patient will contact our office for additional recommendations. Electronic Signature(s) Signed: 05/19/2021 4:38:40 PM By: Worthy Keeler PA-C Entered By: Worthy Keeler on 05/19/2021 16:38:40 -------------------------------------------------------------------------------- SuperBill Details Patient Name: Date of Service: [place], [name] 05/19/2021 Medical Record Number: [id number] Patient Account Number: [id number] Date of Birth/Sex: Treating RN: 1951/10/11 (70 y.o. 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 C79.81 Secondary malignant neoplasm of breast I10 Essential (primary) hypertension I25.10 Atherosclerotic heart disease of native coronary artery without angina pectoris Z92.21 Personal history of antineoplastic chemotherapy Facility Procedures CPT4 Code: [id number] Description: (Facility Use Only) [phone] - [facility] LEG Modifier: Quantity: 1 Physician Procedures : CPT4 Code Description Modifier [id number] - WC PHYS LEVEL 4 - EST PT ICD-10 Diagnosis Description S80.811A Abrasion, right lower leg, initial encounter I87.2 Venous insufficiency (chronic) (peripheral) L97.812 Non-pressure chronic ulcer of other  part of right lower leg with fat layer exposed G90.09 Other idiopathic peripheral autonomic neuropathy Quantity: 1 Electronic Signature(s) Signed: 05/19/2021 4:38:54 PM  By: Worthy Keeler PA-C Entered By: Worthy Keeler on 05/19/2021 16:38:54

## 2021-05-19 NOTE — Progress Notes (Signed)
[name], [name] ([id number]) Visit Report for 05/19/2021 Fall Risk Assessment Details Patient Name: Date of Service: [name] Riley, DATA 05/19/2021 8:45 A M Medical Record Number: [id number] Patient Account Number: [id number] Date of Birth/Sex: Treating RN: 1951-10-10 (70 y.o. [name] Primary Care Sahaana Weitman: [name] Other Clinician: Referring Nilson Tabora: Treating Matthew Cina/Extender: Vickii Penna in Treatment: 5 Fall Risk Assessment Items Have you had 2 or more falls in the last 12 monthso 0 No Have you had any fall that resulted in injury in the last 12 monthso 0 No FALLS RISK SCREEN History of falling - immediate or within 3 months 25 Yes Secondary diagnosis (Do you have 2 or more medical diagnoseso) 0 No Ambulatory aid None/bed rest/wheelchair/nurse 0 Yes Crutches/cane/walker 0 No Furniture 0 No Intravenous therapy Access/Saline/Heparin Lock 0 No Gait/Transferring Normal/ bed rest/ wheelchair 0 Yes Weak (short steps with or without shuffle, stooped but able to lift head while walking, may seek 0 No support from furniture) Impaired (short steps with shuffle, may have difficulty arising from chair, head down, impaired 0 No balance) Mental Status Oriented to own ability 0 Yes Electronic Signature(s) Signed: 05/19/2021 9:10:50 AM By: [name] Entered By: [name] on 05/19/2021 09:10:50

## 2021-05-20 ENCOUNTER — Ambulatory Visit: Payer: Medicare Other | Admitting: Internal Medicine

## 2021-05-21 ENCOUNTER — Inpatient Hospital Stay: Payer: Medicare Other

## 2021-05-21 ENCOUNTER — Ambulatory Visit: Payer: Medicare Other | Admitting: Hematology and Oncology

## 2021-05-21 ENCOUNTER — Other Ambulatory Visit: Payer: Medicare Other

## 2021-05-21 ENCOUNTER — Inpatient Hospital Stay: Payer: Medicare Other | Attending: Hematology and Oncology

## 2021-05-21 ENCOUNTER — Other Ambulatory Visit: Payer: Self-pay

## 2021-05-21 VITALS — BP 138/67 | HR 75 | Temp 98.0°F | Resp 18 | Wt 145.8 lb

## 2021-05-21 DIAGNOSIS — C50919 Malignant neoplasm of unspecified site of unspecified female breast: Secondary | ICD-10-CM

## 2021-05-21 DIAGNOSIS — Z170 Estrogen receptor positive status [ER+]: Secondary | ICD-10-CM | POA: Diagnosis not present

## 2021-05-21 DIAGNOSIS — Z5112 Encounter for antineoplastic immunotherapy: Secondary | ICD-10-CM | POA: Insufficient documentation

## 2021-05-21 DIAGNOSIS — C50412 Malignant neoplasm of upper-outer quadrant of left female breast: Secondary | ICD-10-CM

## 2021-05-21 DIAGNOSIS — C7951 Secondary malignant neoplasm of bone: Secondary | ICD-10-CM

## 2021-05-21 DIAGNOSIS — Z95828 Presence of other vascular implants and grafts: Secondary | ICD-10-CM

## 2021-05-21 LAB — CBC WITH DIFFERENTIAL (CANCER CENTER ONLY)
Abs Immature Granulocytes: 0.01 10*3/uL (ref 0.00–0.07)
Basophils Absolute: 0 10*3/uL (ref 0.0–0.1)
Basophils Relative: 1 %
Eosinophils Absolute: 0 10*3/uL (ref 0.0–0.5)
Eosinophils Relative: 1 %
HCT: 31.1 % — ABNORMAL LOW (ref 36.0–46.0)
Hemoglobin: 10.8 g/dL — ABNORMAL LOW (ref 12.0–15.0)
Immature Granulocytes: 0 %
Lymphocytes Relative: 49 %
Lymphs Abs: 1.4 10*3/uL (ref 0.7–4.0)
MCH: 37.1 pg — ABNORMAL HIGH (ref 26.0–34.0)
MCHC: 34.7 g/dL (ref 30.0–36.0)
MCV: 106.9 fL — ABNORMAL HIGH (ref 80.0–100.0)
Monocytes Absolute: 0.4 10*3/uL (ref 0.1–1.0)
Monocytes Relative: 15 %
Neutro Abs: 1 10*3/uL — ABNORMAL LOW (ref 1.7–7.7)
Neutrophils Relative %: 34 %
Platelet Count: 117 10*3/uL — ABNORMAL LOW (ref 150–400)
RBC: 2.91 MIL/uL — ABNORMAL LOW (ref 3.87–5.11)
RDW: 15.7 % — ABNORMAL HIGH (ref 11.5–15.5)
WBC Count: 2.9 10*3/uL — ABNORMAL LOW (ref 4.0–10.5)
nRBC: 0 % (ref 0.0–0.2)

## 2021-05-21 LAB — CMP (CANCER CENTER ONLY)
ALT: 26 U/L (ref 0–44)
AST: 67 U/L — ABNORMAL HIGH (ref 15–41)
Albumin: 3.3 g/dL — ABNORMAL LOW (ref 3.5–5.0)
Alkaline Phosphatase: 266 U/L — ABNORMAL HIGH (ref 38–126)
Anion gap: 11 (ref 5–15)
BUN: 12 mg/dL (ref 8–23)
CO2: 26 mmol/L (ref 22–32)
Calcium: 9.4 mg/dL (ref 8.9–10.3)
Chloride: 101 mmol/L (ref 98–111)
Creatinine: 0.82 mg/dL (ref 0.44–1.00)
GFR, Estimated: >60 mL/min (ref >60)
Glucose, Bld: 102 mg/dL — ABNORMAL HIGH (ref 70–99)
Potassium: 3.2 mmol/L — ABNORMAL LOW (ref 3.5–5.1)
Sodium: 138 mmol/L (ref 135–145)
Total Bilirubin: 1 mg/dL (ref 0.3–1.2)
Total Protein: 6.6 g/dL (ref 6.5–8.1)

## 2021-05-21 MED ORDER — SODIUM CHLORIDE 0.9 % IV SOLN
Freq: Once | INTRAVENOUS | Status: AC
  Filled 2021-05-21: qty 250

## 2021-05-21 MED ORDER — ACETAMINOPHEN 325 MG PO TABS
650.0000 mg | ORAL_TABLET | Freq: Once | ORAL | Status: AC
  Administered 2021-05-21: 650 mg via ORAL

## 2021-05-21 MED ORDER — DIPHENHYDRAMINE HCL 25 MG PO CAPS
50.0000 mg | ORAL_CAPSULE | Freq: Once | ORAL | Status: AC
  Administered 2021-05-21: 50 mg via ORAL

## 2021-05-21 MED ORDER — SODIUM CHLORIDE 0.9 % IV SOLN
260.0000 mg | Freq: Once | INTRAVENOUS | Status: AC
  Administered 2021-05-21: 260 mg via INTRAVENOUS
  Filled 2021-05-21: qty 5

## 2021-05-21 MED ORDER — DIPHENHYDRAMINE HCL 25 MG PO CAPS
ORAL_CAPSULE | ORAL | Status: AC
  Filled 2021-05-21: qty 2

## 2021-05-21 MED ORDER — HEPARIN SOD (PORK) LOCK FLUSH 100 UNIT/ML IV SOLN
500.0000 [IU] | Freq: Once | INTRAVENOUS | Status: AC | PRN
  Administered 2021-05-21: 500 [IU]
  Filled 2021-05-21: qty 5

## 2021-05-21 MED ORDER — ACETAMINOPHEN 325 MG PO TABS
ORAL_TABLET | ORAL | Status: AC
  Filled 2021-05-21: qty 2

## 2021-05-21 MED ORDER — SODIUM CHLORIDE 0.9% FLUSH
10.0000 mL | INTRAVENOUS | Status: DC | PRN
  Administered 2021-05-21: 10 mL
  Filled 2021-05-21: qty 10

## 2021-05-21 MED ORDER — SODIUM CHLORIDE 0.9% FLUSH
10.0000 mL | Freq: Once | INTRAVENOUS | Status: AC
  Administered 2021-05-21: 10 mL
  Filled 2021-05-21: qty 10

## 2021-05-21 NOTE — Patient Instructions (Signed)
[facility]  Discharge Instructions: Thank you for choosing [facility] to provide your oncology and hematology care.   If you have a lab appointment with the Cancer Center, please go directly to the Cancer Center and check in at the registration area.   Wear comfortable clothing and clothing appropriate for easy access to any Portacath or PICC line.   We strive to give you quality time with your provider. You may need to reschedule your appointment if you arrive late (15 or more minutes).  Arriving late affects you and other patients whose appointments are after yours.  Also, if you miss three or more appointments without notifying the office, you may be dismissed from the clinic at the provider's discretion.      For prescription refill requests, have your pharmacy contact our office and allow 72 hours for refills to be completed.    Today you received the following chemotherapy and/or immunotherapy agents Ado-Trastuzumab Emtansine.      To help prevent nausea and vomiting after your treatment, we encourage you to take your nausea medication as directed.  BELOW ARE SYMPTOMS THAT SHOULD BE REPORTED IMMEDIATELY: *FEVER GREATER THAN 100.4 F (38 C) OR HIGHER *CHILLS OR SWEATING *NAUSEA AND VOMITING THAT IS NOT CONTROLLED WITH YOUR NAUSEA MEDICATION *UNUSUAL SHORTNESS OF BREATH *UNUSUAL BRUISING OR BLEEDING *URINARY PROBLEMS (pain or burning when urinating, or frequent urination) *BOWEL PROBLEMS (unusual diarrhea, constipation, pain near the anus) TENDERNESS IN MOUTH AND THROAT WITH OR WITHOUT PRESENCE OF ULCERS (sore throat, sores in mouth, or a toothache) UNUSUAL RASH, SWELLING OR PAIN  UNUSUAL VAGINAL DISCHARGE OR ITCHING   Items with * indicate a potential emergency and should be followed up as soon as possible or go to the Emergency Department if any problems should occur.  Please show the CHEMOTHERAPY ALERT CARD or IMMUNOTHERAPY ALERT CARD  at check-in to the Emergency Department and triage nurse.  Should you have questions after your visit or need to cancel or reschedule your appointment, please contact [facility]  Dept: [phone]  and follow the prompts.  Office hours are 8:00 a.m. to 4:30 p.m. Monday - Friday. Please note that voicemails left after 4:00 p.m. may not be returned until the following business day.  We are closed weekends and major holidays. You have access to a nurse at all times for urgent questions. Please call the main number to the clinic Dept: [phone] and follow the prompts.   For any non-urgent questions, you may also contact your provider using MyChart. We now offer e-Visits for anyone 18 and older to request care online for non-urgent symptoms. For details visit [email or web address].   Also download the MyChart app! Go to the app store, search "MyChart", open the app, select [facility], and log in with your MyChart username and password.  Due to Covid, a mask is required upon entering the hospital/clinic. If you do not have a mask, one will be given to you upon arrival. For doctor visits, patients may have 1 support person aged 18 or older with them. For treatment visits, patients cannot have anyone with them due to current Covid guidelines and our immunocompromised population.   

## 2021-05-21 NOTE — Progress Notes (Signed)
ANC = 1.0; ok to treat per MD.  Larene Beach, PharmD

## 2021-05-26 ENCOUNTER — Other Ambulatory Visit: Payer: Self-pay

## 2021-05-26 ENCOUNTER — Other Ambulatory Visit (HOSPITAL_COMMUNITY): Payer: Self-pay

## 2021-05-26 ENCOUNTER — Encounter (HOSPITAL_BASED_OUTPATIENT_CLINIC_OR_DEPARTMENT_OTHER): Payer: Medicare Other | Admitting: Physician Assistant

## 2021-05-26 DIAGNOSIS — L97812 Non-pressure chronic ulcer of other part of right lower leg with fat layer exposed: Secondary | ICD-10-CM | POA: Diagnosis not present

## 2021-05-26 NOTE — Progress Notes (Addendum)
SHLEY, PICKLE ([id number]) Visit Report for 05/26/2021 Chief Complaint Document Details Patient Name: Date of Service: [name] Riley, [name] A. 05/26/2021 8:00 A M Medical Record Number: [id number] Patient Account Number: [id number] Date of Birth/Sex: Treating RN: 12-05-1950 (70 y.o. [name] Primary Care Provider: [name] Other Clinician: Referring Provider: Treating Provider/Extender: Vickii Penna in Treatment: 6 Information Obtained from: Patient Chief Complaint Right LE Ulcer Electronic Signature(s) Signed: 05/26/2021 8:10:37 AM By: Worthy Keeler PA-C Entered By: Worthy Keeler on 05/26/2021 08:10:37 -------------------------------------------------------------------------------- Debridement Details Patient Name: Date of Service: [name] Riley, [name] 05/26/2021 8:00 A M Medical Record Number: [id number] Patient Account Number: [id number] Date of Birth/Sex: Treating RN: 1950-12-15 (70 y.o. [name] Primary Care Provider: [name] Other Clinician: Referring Provider: Treating Provider/Extender: Vickii Penna in Treatment: 6 Debridement Performed for Assessment: Wound #1 Right,Anterior Lower Leg Performed By: Physician Worthy Keeler, PA Debridement Type: Debridement Level of Consciousness (Pre-procedure): Awake and Alert Pre-procedure Verification/Time Out Yes - 08:35 Taken: Start Time: 08:37 Pain Control: Other : benzocaine 20% spray T Area Debrided (L x W): otal 0.5 (cm) x 0.8 (cm) = 0.4 (cm) Tissue and other material debrided: Viable, Non-Viable, Slough, Subcutaneous, Slough Level: Skin/Subcutaneous Tissue Debridement Description: Excisional Instrument: Curette Bleeding: Minimum Hemostasis Achieved: Pressure End Time: 08:40 Procedural Pain: 2 Post Procedural Pain: 1 Response to Treatment: Procedure was tolerated well Level of Consciousness (Post- Awake and Alert procedure): Post  Debridement Measurements of Total Wound Length: (cm) 0.5 Width: (cm) 0.8 Depth: (cm) 0.1 Volume: (cm) 0.031 Character of Wound/Ulcer Post Debridement: Improved Post Procedure Diagnosis Same as Pre-procedure Electronic Signature(s) Signed: 05/26/2021 3:55:35 PM By: Baruch Gouty RN, BSN Signed: 05/27/2021 5:47:57 PM By: Worthy Keeler PA-C Entered By: Baruch Gouty on 05/26/2021 08:41:54 -------------------------------------------------------------------------------- HPI Details Patient Name: Date of Service: [name] Riley, [name] 05/26/2021 8:00 A M Medical Record Number: [id number] Patient Account Number: [id number] Date of Birth/Sex: Treating RN: 08/13/1951 (70 y.o. [name] Primary Care Provider: [name] Other Clinician: Referring Provider: Treating Provider/Extender: Vickii Penna in Treatment: 6 History of Present Illness HPI Description: 04/14/2021 upon evaluation today patient presents for initial inspection here in the clinic concerning issues that she has been having with a wound on her right anterior lower leg after she had an abrasion trauma 4 weeks ago. She tells me that unfortunately following this trauma she has been experiencing quite a bit of discomfort at times with some burning and stinging. She is also had a scab up once or twice but states that right now she has been unable to really get it to scab back over. This has been a frustration for her and she is not really able to get it to heal as quickly and effectively as she would like to see. The patient also tells me that she has been tolerating the dressing changes without complication. She has just been using over-the-counter antibiotic ointment and try to keep it covered with a dry dressing at times depending on what she was told to do. Nonetheless right now she is also been on Keflex that did not really seem to help she was switched over to Augmentin she does started that on  Friday. Currently I do not see anything that seems to be obviously infected but nonetheless I think it is fine for her to take the Augmentin just to make sure that we do not get anything that  initiates in the interim. The patient is currently undergoing chemotherapy she has had previous radiation. Nonetheless in regard to the chemotherapy this could be affecting her ability to heal to some degree. 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 She has hypertension, neuropathy associated with the chemotherapy, and a history of heart disease. She is currently again undergoing active chemotherapy for recurrent breast cancer. 04/21/2021 upon evaluation today patient appears to be doing well with regard to her leg ulcer. This is actually looking significantly improved. There does not appear to be any signs of infection visually and I am happy in that regard. Fortunately there is no signs of systemic infection either which is also great news. No fevers, chills, nausea, vomiting, or diarrhea. 05/05/2021 upon evaluation today patient appears to be doing well with regard to her leg ulcer. She has been tolerating the dressing changes without complication. Fortunately there does not appear to be any signs of infection which is great news. No fevers, chills, nausea, vomiting, or diarrhea. 05/12/2021 upon evaluation today patient actually appears to be making good improvement in general. I am extremely pleased with where things stand today. There is no signs of active infection which is also great news. 05/19/2021 upon evaluation today patient appears to be doing well with regard to her wound. This is actually showing signs of excellent granulation epithelization at this point. I do not see any evidence of infection which is great and overall I am extremely pleased with where we stand today. In general I think that  the patient is making leasing strides towards getting this healed. The issue that she had unfortunately her mother did pass this past week. She was at the wedding yesterday when she actually felt faint coming out of the church there is a lot of emotional situation involved obviously. Nonetheless she somewhat started to pass out her brothers caught her but she did hit her leg laterally underneath the wrap on the side of the steps. This caused a small skin tear and some contusion fortunately this is not too bad but nonetheless I do think that this needs to be monitored currently. 05/26/2021 upon evaluation today patient appears to be doing excellent in regard to her wound on the leg. This is can require some sharp debridement but in general I feel like she is doing very well. The new skin tear that was noted last week is actually healing quite nicely reattached and has done great I think this will probably be pretty much resolved by next week and then should start just even an hour after that. As far as the color of the skin is concerned the bruising will fade is what I mean by that. Electronic Signature(s) Signed: 05/26/2021 8:45:21 AM By: Worthy Keeler PA-C Entered By: Worthy Keeler on 05/26/2021 08:45:21 -------------------------------------------------------------------------------- Physical Exam Details Patient Name: Date of Service: [name] Riley, [name] 05/26/2021 8:00 A M Medical Record Number: [id number] Patient Account Number: [id number] Date of Birth/Sex: Treating RN: 04-30-1951 (70 y.o. [name] Primary Care Provider: Other Clinician: [name] Referring Provider: Treating Provider/Extender: Vickii Penna in Treatment: 6 Constitutional Well-nourished and well-hydrated in no acute distress. Respiratory normal breathing without difficulty. Psychiatric this patient is able to make decisions and demonstrates good insight into disease process.  Alert and Oriented x 3. pleasant and cooperative. Notes Upon inspection patient's wound bed actually showed signs  of good granulation and epithelization at this point. There does not appear to be any evidence of active infection which is great news and overall I am extremely pleased in that regard with where things stand. I did have to perform sharp debridement to remove some slough and biofilm from the surface of the wound as well as minimal amount of eschar that was attempting to form on the edge of the wound. She tolerated this today with minimal bleeding and really no significant pain. In regard to the skin tear this is really here quite nicely and very pleased in that regard. Electronic Signature(s) Signed: 05/26/2021 8:46:22 AM By: Worthy Keeler PA-C Entered By: Worthy Keeler on 05/26/2021 08:46:22 -------------------------------------------------------------------------------- Physician Orders Details Patient Name: Date of Service: Soth, [name] 05/26/2021 8:00 A M Medical Record Number: [id number] Patient Account Number: [id number] Date of Birth/Sex: Treating RN: 1950-11-18 (70 y.o. 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 Bathing/ Shower/  Hygiene May shower with protection but do not get wound dressing(s) wet. Edema Control - Lymphedema / SCD / Other Right Lower Extremity Elevate legs to the level of the heart or above for 30 minutes daily and/or when sitting, a frequency of: - 3-4 times per day Avoid standing for long periods of time. Exercise regularly Wound Treatment Wound #1 - Lower Leg Wound Laterality: Right, Anterior Peri-Wound Care: Sween Lotion (Moisturizing lotion) 1 x Per Week/30 Days Discharge Instructions: Apply moisturizing lotion as directed Prim Dressing: Promogran Prisma Matrix, 4.34 (sq in) (silver collagen) 1 x Per Week/30 Days ary Discharge Instructions: Moisten collagen with saline or hydrogel Secondary Dressing: Woven Gauze Sponge, Non-Sterile 4x4 in 1 x Per Week/30 Days Discharge Instructions: Apply over primary dressing as directed. Compression Wrap: ThreePress (3 layer compression wrap) 1 x Per Week/30 Days Discharge Instructions: Apply three layer compression as directed. Wound #2 - Lower Leg Wound Laterality: Right, Lateral Peri-Wound Care: Sween Lotion (Moisturizing lotion) 1 x Per Week/30 Days Discharge Instructions: Apply moisturizing lotion as directed Prim Dressing: Xeroform Occlusive Gauze Dressing, 4x4 in 1 x Per Week/30 Days ary Discharge Instructions: Apply to wound bed as instructed Secondary Dressing: Woven Gauze Sponge, Non-Sterile 4x4 in 1 x Per Week/30 Days Discharge Instructions: Apply over primary dressing as directed. Compression Wrap: ThreePress (3 layer compression wrap) 1 x Per Week/30 Days Discharge Instructions: Apply three layer compression as directed. Patient Medications llergies: cantaloupe extract, Iodinated Contrast Media, pravastatin, Zosyn, latex A Notifications Medication Indication Start End prior to debridement 05/26/2021 benzocaine DOSE topical 20 % aerosol - aerosol topical Electronic Signature(s) Signed: 05/26/2021 3:55:35 PM By: Baruch Gouty RN,  BSN Signed: 05/27/2021 5:47:57 PM By: Worthy Keeler PA-C Entered By: Baruch Gouty on 05/26/2021 08:44:01 -------------------------------------------------------------------------------- Problem List Details Patient Name: Date of Service: Hemrick, [name] 05/26/2021 8:00 A M Medical Record Number: [id number] Patient Account Number: [id number] Date of Birth/Sex: Treating RN: 1951/07/26 (70 y.o. [name] Primary Care Provider: Livia Snellen,  Cletus Gash Other Clinician: Referring Provider: Treating Provider/Extender: Vickii Penna in Treatment: 6 Active Problems ICD-10 Encounter Code Description Active Date MDM Diagnosis S80.811A Abrasion, right lower leg, initial encounter 04/14/2021 No Yes I87.2 Venous insufficiency (chronic) (peripheral) 04/14/2021 No Yes L97.812 Non-pressure chronic ulcer of other part of right lower leg with fat layer 04/14/2021 No Yes exposed G90.09 Other idiopathic peripheral autonomic neuropathy 04/14/2021 No Yes C79.81 Secondary malignant neoplasm of breast 04/14/2021 No Yes I10 Essential (primary) hypertension 04/14/2021 No Yes I25.10 Atherosclerotic heart disease of native coronary artery without angina pectoris 04/14/2021 No Yes Z92.21 Personal history of antineoplastic chemotherapy 04/14/2021 No Yes Inactive Problems Resolved Problems Electronic Signature(s) Signed: 05/26/2021 8:10:32 AM By: Worthy Keeler PA-C Entered By: Worthy Keeler on 05/26/2021 08:10:32 -------------------------------------------------------------------------------- Progress Note Details Patient Name: Date of Service: [name] Riley, [name] 05/26/2021 8:00 A M Medical Record Number: [id number] Patient Account Number: [id number] Date of Birth/Sex: Treating RN: 08-02-1951 (70 y.o. [name] Primary Care Provider: [name] Other Clinician: Referring Provider: Treating Provider/Extender: Vickii Penna in Treatment:  6 Subjective Chief Complaint Information obtained from Patient Right LE Ulcer History of Present Illness (HPI) 04/14/2021 upon evaluation today patient presents for initial inspection here in the clinic concerning issues that she has been having with a wound on her right anterior lower leg after she had an abrasion trauma 4 weeks ago. She tells me that unfortunately following this trauma she has been experiencing quite a bit of discomfort at times with some burning and stinging. She is also had a scab up once or twice but states that right now she has been unable to really get it to scab back over. This has been a frustration for her and she is not really able to get it to heal as quickly and effectively as she would like to see. The patient also tells me that she has been tolerating the dressing changes without complication. She has just been using over-the-counter antibiotic ointment and try to keep it covered with a dry dressing at times depending on what she was told to do. Nonetheless right now she is also been on Keflex that did not really seem to help she was switched over to Augmentin she does started that on Friday. Currently I do not see anything that seems to be obviously infected but nonetheless I think it is fine for her to take the Augmentin just to make sure that we do not get anything that initiates in the interim. The patient is currently undergoing chemotherapy she has had previous radiation. Nonetheless in regard to the chemotherapy this could be affecting her ability to heal to some degree. 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 She has hypertension, neuropathy associated with the chemotherapy, and a history of heart disease. She is currently again undergoing active chemotherapy for recurrent breast cancer. 04/21/2021 upon evaluation today patient appears to be doing  well with regard to her leg ulcer. This is actually looking significantly improved. There does not appear to be any signs of infection visually and I am happy in that regard. Fortunately there is no signs of systemic infection either which is also great news. No fevers, chills, nausea, vomiting, or diarrhea. 05/05/2021 upon evaluation today patient appears to be doing well with regard to her leg ulcer. She has been tolerating the dressing changes without complication. Fortunately there  does not appear to be any signs of infection which is great news. No fevers, chills, nausea, vomiting, or diarrhea. 05/12/2021 upon evaluation today patient actually appears to be making good improvement in general. I am extremely pleased with where things stand today. There is no signs of active infection which is also great news. 05/19/2021 upon evaluation today patient appears to be doing well with regard to her wound. This is actually showing signs of excellent granulation epithelization at this point. I do not see any evidence of infection which is great and overall I am extremely pleased with where we stand today. In general I think that the patient is making leasing strides towards getting this healed. The issue that she had unfortunately her mother did pass this past week. She was at the wedding yesterday when she actually felt faint coming out of the church there is a lot of emotional situation involved obviously. Nonetheless she somewhat started to pass out her brothers caught her but she did hit her leg laterally underneath the wrap on the side of the steps. This caused a small skin tear and some contusion fortunately this is not too bad but nonetheless I do think that this needs to be monitored currently. 05/26/2021 upon evaluation today patient appears to be doing excellent in regard to her wound on the leg. This is can require some sharp debridement but in general I feel like she is doing very well. The new skin  tear that was noted last week is actually healing quite nicely reattached and has done great I think this will probably be pretty much resolved by next week and then should start just even an hour after that. As far as the color of the skin is concerned the bruising will fade is what I mean by that. Objective Constitutional Well-nourished and well-hydrated in no acute distress. Vitals Time Taken: 8:04 AM, Height: 68 in, Weight: 157 lbs, BMI: 23.9, Temperature: 98.1 F, Pulse: 75 bpm, Respiratory Rate: 16 breaths/min, Blood Pressure: 107/63 mmHg. Respiratory normal breathing without difficulty. Psychiatric this patient is able to make decisions and demonstrates good insight into disease process. Alert and Oriented x 3. pleasant and cooperative. General Notes: Upon inspection patient's wound bed actually showed signs of good granulation and epithelization at this point. There does not appear to be any evidence of active infection which is great news and overall I am extremely pleased in that regard with where things stand. I did have to perform sharp debridement to remove some slough and biofilm from the surface of the wound as well as minimal amount of eschar that was attempting to form on the edge of the wound. She tolerated this today with minimal bleeding and really no significant pain. In regard to the skin tear this is really here quite nicely and very pleased in that regard. Integumentary (Hair, Skin) Wound #1 status is Open. Original cause of wound was Trauma. The date acquired was: 03/17/2021. The wound has been in treatment 6 weeks. The wound is located on the Right,Anterior Lower Leg. The wound measures 0.5cm length x 0.8cm width x 0.2cm depth; 0.314cm^2 area and 0.063cm^3 volume. There is Fat Layer (Subcutaneous Tissue) exposed. There is a medium amount of serosanguineous drainage noted. The wound margin is distinct with the outline attached to the wound base. There is large (67-100%)  red, pink granulation within the wound bed. There is a small (1-33%) amount of necrotic tissue within the wound bed. Wound #2 status is Open. Original cause of  wound was Trauma. The date acquired was: 05/15/2021. The wound has been in treatment 1 weeks. The wound is located on the Right,Lateral Lower Leg. The wound measures 0.2cm length x 0.2cm width x 0.1cm depth; 0.031cm^2 area and 0.003cm^3 volume. There is Fat Layer (Subcutaneous Tissue) exposed. There is a medium amount of sanguinous drainage noted. The wound margin is distinct with the outline attached to the wound base. There is large (67-100%) red granulation within the wound bed. There is no necrotic tissue within the wound bed. 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 There was a Excisional Skin/Subcutaneous Tissue Debridement with a total area of 0.4 sq cm performed by Worthy Keeler, PA. With the following instrument(s): Curette to remove Viable and Non-Viable tissue/material. Material removed includes Subcutaneous Tissue and Slough and after achieving pain control using Other (benzocaine 20% spray). No specimens were taken. A time out was conducted at 08:35, prior to the start of the procedure. A Minimum amount of bleeding was controlled with Pressure. The procedure was tolerated well with a pain level of 2 throughout and a pain level of 1 following the procedure. Post Debridement Measurements: 0.5cm length  x 0.8cm width x 0.1cm depth; 0.031cm^3 volume. Character of Wound/Ulcer Post Debridement is improved. Post procedure Diagnosis Wound #1: Same as Pre-Procedure Pre-procedure diagnosis of Wound #1 is a Trauma, Other located on the Right,Anterior Lower Leg . There was a Three Layer Compression Therapy Procedure by Deon Pilling, RN. Post procedure Diagnosis Wound #1: Same as Pre-Procedure Plan Follow-up Appointments: Return Appointment in 1 week. Bathing/ Shower/ Hygiene: May shower with protection but do not get wound dressing(s) wet. Edema Control - Lymphedema / SCD / Other: Elevate legs to the level of the heart or above for 30 minutes daily and/or when sitting, a frequency of: - 3-4 times per day Avoid standing for long periods of time. Exercise regularly The following medication(s) was prescribed: benzocaine topical 20 % aerosol aerosol topical for prior to debridement was prescribed at facility WOUND #1: - Lower Leg Wound Laterality: Right, Anterior Peri-Wound Care: Sween Lotion (Moisturizing lotion) 1 x Per Week/30 Days Discharge Instructions: Apply moisturizing lotion as directed Prim Dressing: Promogran Prisma Matrix, 4.34 (sq in) (silver collagen) 1 x Per Week/30 Days ary Discharge Instructions: Moisten collagen with saline or hydrogel Secondary Dressing: Woven Gauze Sponge, Non-Sterile 4x4 in 1 x Per Week/30 Days Discharge Instructions: Apply over primary dressing as directed. Com pression Wrap: ThreePress (3 layer compression wrap) 1 x Per Week/30 Days Discharge Instructions: Apply three layer compression as directed. WOUND #2: - Lower Leg Wound Laterality: Right, Lateral Peri-Wound Care: Sween Lotion (Moisturizing lotion) 1 x Per Week/30 Days Discharge Instructions: Apply moisturizing lotion as directed Prim Dressing: Xeroform Occlusive Gauze Dressing, 4x4 in 1 x Per Week/30 Days ary Discharge Instructions: Apply to wound bed as instructed Secondary Dressing: Woven Gauze  Sponge, Non-Sterile 4x4 in 1 x Per Week/30 Days Discharge Instructions: Apply over primary dressing as directed. Com pression Wrap: ThreePress (3 layer compression wrap) 1 x Per Week/30 Days Discharge Instructions: Apply three layer compression as directed. 1. Would recommend that we  continue with the silver collagen to the main wound of the lower leg I think this is doing a great job. 2. I am also can recommend that we continue with the Xeroform to the skin tear on the side that did very well for her. 3. I would also recommend that we go ahead and continue with the 3 layer compression wrap which I think is doing a great job as well. We will see patient back for reevaluation in 1 week here in the clinic. If anything worsens or changes patient will contact our office for additional recommendations. Electronic Signature(s) Signed: 05/26/2021 8:46:49 AM By: Worthy Keeler PA-C Entered By: Worthy Keeler on 05/26/2021 08:46:49 -------------------------------------------------------------------------------- SuperBill Details Patient Name: Date of Service: [name] Riley, [name] 05/26/2021 Medical Record Number: [id number] Patient Account Number: [id number] Date of Birth/Sex: Treating RN: 1951-04-22 (70 y.o. 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 C79.81 Secondary malignant neoplasm of breast I10 Essential (primary) hypertension I25.10 Atherosclerotic heart disease of native coronary artery without angina pectoris Z92.21 Personal history of antineoplastic  chemotherapy Facility Procedures CPT4 Code: [id number] Description: [id number] - DEB SUBQ TISSUE 20 SQ CM/< ICD-10 Diagnosis Description L97.812 Non-pressure chronic ulcer of other part of right lower leg with fat layer exp Modifier: osed Quantity: 1 Physician Procedures : CPT4 Code Description Modifier [id number] - WC PHYS SUBQ TISS 20 SQ CM ICD-10 Diagnosis Description [id number] Non-pressure chronic ulcer of other part of right lower leg with fat layer exposed Quantity: 1 Electronic Signature(s) Signed: 05/26/2021 8:47:53 AM By: Worthy Keeler PA-C Entered By: Worthy Keeler on 05/26/2021 08:47:53

## 2021-06-01 NOTE — Progress Notes (Signed)
[name] Riley, [name] Riley ([id number]) Visit Report for 05/26/2021 Arrival Information Details Patient Name: Date of Service: [name] Riley, [name] A. 05/26/2021 8:00 A M Medical Record Number: [id number] Patient Account Number: [id number] Date of Birth/Sex: Treating RN: February 10, 1951 (70 y.o. Martyn Malay, Linda Primary Care Juaquina Machnik: [name] Other Clinician: Referring Sherrika Weakland: Treating Dailin Sosnowski/Extender: Vickii Penna in Treatment: 6 Visit Information History Since Last Visit Added or deleted any medications: No Patient Arrived: Ambulatory Any new allergies or adverse reactions: No Arrival Time: 08:04 Had a fall or experienced change in No Accompanied By: self activities of daily living that may affect Transfer Assistance: None risk of falls: Patient Identification Verified: Yes Signs or symptoms of abuse/neglect since last visito No Secondary Verification Process Completed: Yes Hospitalized since last visit: No Patient Requires Transmission-Based Precautions: No Implantable device outside of the clinic excluding No Patient Has Alerts: No cellular tissue based products placed in the center since last visit: Has Dressing in Place as Prescribed: Yes Pain Present Now: Yes Electronic Signature(s) Signed: 06/01/2021 11:32:43 AM By: Sandre Kitty Entered By: Sandre Kitty on 05/26/2021 08:04:57 -------------------------------------------------------------------------------- Compression Therapy Details Patient Name: Date of Service: [name] Riley, [name] 05/26/2021 8:00 A M Medical Record Number: [id number] Patient Account Number: [id number] Date of Birth/Sex: Treating RN: 01-21-1951 (70 y.o. [name] Primary Care Lou Irigoyen: [name] Other Clinician: Referring Maxie Slovacek: Treating Jazion Atteberry/Extender: Vickii Penna in Treatment: 6 Compression Therapy Performed for Wound Assessment: Wound #1 Right,Anterior Lower Leg Performed By:  Clinician Deon Pilling, RN Compression Type: Three Layer Post Procedure Diagnosis Same as Pre-procedure Electronic Signature(s) Signed: 05/26/2021 3:55:35 PM By: Baruch Gouty RN, BSN Entered By: Baruch Gouty on 05/26/2021 08:38:29 -------------------------------------------------------------------------------- Encounter Discharge Information Details Patient Name: Date of Service: [name] Riley, [name] 05/26/2021 8:00 A M Medical Record Number: [id number] Patient Account Number: [id number] Date of Birth/Sex: Treating RN: 1951/01/10 (70 y.o. [name] Primary Care Myrlene Riera: [name] Other Clinician: Referring Elenora Hawbaker: Treating Erlean Mealor/Extender: Vickii Penna in Treatment: 6 Encounter Discharge Information Items Post Procedure Vitals Discharge Condition: Stable Temperature (F): 98.1 Ambulatory Status: Ambulatory Pulse (bpm): 75 Discharge Destination: Home Respiratory Rate (breaths/min): 16 Transportation: Private Auto Blood Pressure (mmHg): 107/63 Accompanied By: self Schedule Follow-up Appointment: Yes Clinical Summary of Care: Electronic Signature(s) Signed: 05/26/2021 3:36:01 PM By: Deon Pilling Entered By: Deon Pilling on 05/26/2021 09:09:07 -------------------------------------------------------------------------------- [place] Details Patient Name: Date of Service: [name] Riley, [name] 05/26/2021 8:00 A M Medical Record Number: [id number] Patient Account Number: [id number] Date of Birth/Sex: Treating RN: 23-Jan-1951 (70 y.o. [name] Primary Care Sydney Hasten: [name] Other Clinician: Referring Jaylei Fuerte: Treating Braylinn Gulden/Extender: Vickii Penna in Treatment: [place] reviewed with physician Active Inactive Abuse / Safety / Falls / Self Care Management Nursing Diagnoses: Potential for falls Goals: Patient/caregiver will verbalize/demonstrate  measures taken to prevent injury and/or falls Date Initiated: 04/14/2021 Target Resolution Date: 06/09/2021 Goal Status: Active Interventions: Assess fall risk on admission and as needed Notes: 05/19/21 pt fell yesterday and bumped leg Venous Leg Ulcer Nursing Diagnoses: Knowledge deficit related to disease process and management Potential for venous Insuffiency (use before diagnosis confirmed) Goals: Patient will maintain optimal edema control Date Initiated: 04/14/2021 Target Resolution Date: 06/09/2021 Goal Status: Active Interventions: Assess peripheral edema status every visit. Compression as ordered Provide education on venous insufficiency Treatment Activities: Therapeutic compression applied : 04/14/2021 Notes: Wound/Skin Impairment Nursing Diagnoses: Impaired tissue integrity Knowledge  deficit related to ulceration/compromised skin integrity Goals: Patient/caregiver will verbalize understanding of skin care regimen Date Initiated: 04/14/2021 Target Resolution Date: 06/09/2021 Goal Status: Active Ulcer/skin breakdown will have a volume reduction of 30% by week 4 Date Initiated: 04/14/2021 Date Inactivated: 05/12/2021 Target Resolution Date: 05/12/2021 Goal Status: Met Ulcer/skin breakdown will have a volume reduction of 50% by week 8 Date Initiated: 05/12/2021 Target Resolution Date: 06/09/2021 Goal Status: Active Interventions: Assess patient/caregiver ability to obtain necessary supplies Assess patient/caregiver ability to perform ulcer/skin care regimen upon admission and as needed Assess ulceration(s) every visit Provide education on ulcer and skin care Treatment Activities: Skin care regimen initiated : 04/14/2021 Topical wound management initiated : 04/14/2021 Notes: Electronic Signature(s) Signed: 05/26/2021 3:55:35 PM By: Baruch Gouty RN, BSN Entered By: Baruch Gouty on 05/26/2021  08:37:37 -------------------------------------------------------------------------------- Pain Assessment Details Patient Name: Date of Service: [name] Riley, [name] 05/26/2021 8:00 A M Medical Record Number: [id number] Patient Account Number: [id number] Date of Birth/Sex: Treating RN: Sep 18, 1951 (70 y.o. [name] Primary Care Kinsey Cowsert: [name] Other Clinician: Referring Cristan Hout: Treating Marilynn Ekstein/Extender: Vickii Penna in Treatment: 6 Active Problems Location of Pain Severity and Description of Pain Patient Has Paino Yes Site Locations Rate the pain. Rate the pain. Current Pain Level: 4 Pain Management and Medication Current Pain Management: Electronic Signature(s) Signed: 05/26/2021 3:55:35 PM By: Baruch Gouty RN, BSN Signed: 06/01/2021 11:32:43 AM By: Sandre Kitty Entered By: Sandre Kitty on 05/26/2021 08:05:25 -------------------------------------------------------------------------------- Patient/Caregiver Education Details Patient Name: Date of Service: [name] Riley, [name] 8/10/2022andnbsp8:00 A M Medical Record Number: [id number] Patient Account Number: [id number] Date of Birth/Gender: Treating RN: 08/22/1951 (70 y.o. [name] Primary Care Physician: [name] Other Clinician: Referring Physician: Treating Physician/Extender: Vickii Penna in Treatment: 6 Education Assessment Education Provided To: Patient Education Topics Provided Venous: Methods: Explain/Verbal Responses: Reinforcements needed, State content correctly Electronic Signature(s) Signed: 05/26/2021 3:55:35 PM By: Baruch Gouty RN, BSN Entered By: Baruch Gouty on 05/26/2021 08:37:52 -------------------------------------------------------------------------------- Wound Assessment Details Patient Name: Date of Service: [name] Riley, [name] 05/26/2021 8:00 A M Medical Record Number: [id number] Patient Account  Number: [id number] Date of Birth/Sex: Treating RN: [birth year] (70 y.o. [name] Primary Care Talmadge Ganas: [name] Other Clinician: Referring Traquan Duarte: Treating Caiden Monsivais/Extender: Vickii Penna in Treatment: 6 Wound Status Wound Number: 1 Primary Trauma, Other Etiology: Wound Location: Right, Anterior Lower Leg Wound Open Wounding Event: Trauma Status: Date Acquired: 03/17/2021 Comorbid Coronary Artery Disease, Hypertension, Myocardial Infarction, Weeks Of Treatment: 6 History: Neuropathy, Received Chemotherapy, Received Radiation Clustered Wound: No Photos Wound Measurements Length: (cm) 0.5 Width: (cm) 0.8 Depth: (cm) 0.2 Area: (cm) 0.314 Volume: (cm) 0.063 % Reduction in Area: 81% % Reduction in Volume: 61.8% Epithelialization: Small (1-33%) Wound Description Classification: Full Thickness Without Exposed Support Structures Wound Margin: Distinct, outline attached Exudate Amount: Medium Exudate Type: Serosanguineous Exudate Color: red, brown Foul Odor After Cleansing: No Slough/Fibrino Yes Wound Bed Granulation Amount: Large (67-100%) Exposed Structure Granulation Quality: Red, Pink Fascia Exposed: No Necrotic Amount: Small (1-33%) Fat Layer (Subcutaneous Tissue) Exposed: Yes Tendon Exposed: No Muscle Exposed: No Joint Exposed: No Bone Exposed: No Treatment Notes Wound #1 (Lower Leg) Wound Laterality: Right, Anterior Cleanser Peri-Wound Care Sween Lotion (Moisturizing lotion) Discharge Instruction: Apply moisturizing lotion as directed Topical Primary Dressing Promogran Prisma Matrix, 4.34 (sq in) (silver collagen) Discharge Instruction: Moisten collagen with saline or hydrogel Secondary Dressing Woven Gauze Sponge, Non-Sterile 4x4 in Discharge Instruction: Apply  over primary dressing as directed. Secured With Compression Wrap ThreePress (3 layer compression wrap) Discharge Instruction: Apply three layer  compression as directed. Compression Stockings Add-Ons Electronic Signature(s) Signed: 05/26/2021 3:55:35 PM By: Baruch Gouty RN, BSN Signed: 06/01/2021 11:32:43 AM By: Sandre Kitty Entered By: Sandre Kitty on 05/26/2021 08:12:29 -------------------------------------------------------------------------------- Wound Assessment Details Patient Name: Date of Service: [name] Riley, [name] 05/26/2021 8:00 A M Medical Record Number: [id number] Patient Account Number: [id number] Date of Birth/Sex: Treating RN: Sep 05, 1951 (70 y.o. [name] Primary Care Deondrea Markos: [name] Other Clinician: Referring Shawanda Sievert: Treating Jewelle Whitner/Extender: Vickii Penna in Treatment: 6 Wound Status Wound Number: 2 Primary Trauma, Other Etiology: Wound Location: Right, Lateral Lower Leg Wound Open Wounding Event: Trauma Status: Date Acquired: 05/15/2021 Comorbid Coronary Artery Disease, Hypertension, Myocardial Infarction, Weeks Of Treatment: 1 History: Neuropathy, Received Chemotherapy, Received Radiation Clustered Wound: No Photos Wound Measurements Length: (cm) 0.2 Width: (cm) 0.2 Depth: (cm) 0.1 Area: (cm) 0.031 Volume: (cm) 0.003 % Reduction in Area: 89% % Reduction in Volume: 89.3% Epithelialization: None Wound Description Classification: Full Thickness Without Exposed Support Structures Wound Margin: Distinct, outline attached Exudate Amount: Medium Exudate Type: Sanguinous Exudate Color: red Foul Odor After Cleansing: No Slough/Fibrino No Wound Bed Granulation Amount: Large (67-100%) Exposed Structure Granulation Quality: Red Fascia Exposed: No Necrotic Amount: None Present (0%) Fat Layer (Subcutaneous Tissue) Exposed: Yes Tendon Exposed: No Muscle Exposed: No Joint Exposed: No Bone Exposed: No Treatment Notes Wound #2 (Lower Leg) Wound Laterality: Right, Lateral Cleanser Peri-Wound Care Sween Lotion (Moisturizing  lotion) Discharge Instruction: Apply moisturizing lotion as directed Topical Primary Dressing Xeroform Occlusive Gauze Dressing, 4x4 in Discharge Instruction: Apply to wound bed as instructed Secondary Dressing Woven Gauze Sponge, Non-Sterile 4x4 in Discharge Instruction: Apply over primary dressing as directed. Secured With Compression Wrap ThreePress (3 layer compression wrap) Discharge Instruction: Apply three layer compression as directed. Compression Stockings Add-Ons Electronic Signature(s) Signed: 05/26/2021 3:55:35 PM By: Baruch Gouty RN, BSN Signed: 06/01/2021 11:32:43 AM By: Sandre Kitty Entered By: Sandre Kitty on 05/26/2021 08:13:15 -------------------------------------------------------------------------------- Vitals Details Patient Name: Date of Service: [name] Riley, [name] 05/26/2021 8:00 A M Medical Record Number: [id number] Patient Account Number: [id number] Date of Birth/Sex: Treating RN: Mar 29, 1951 (70 y.o. [name] Primary Care Ader Fritze: [name] Other Clinician: Referring Hoang Pettingill: Treating Brahm Barbeau/Extender: Vickii Penna in Treatment: 6 Vital Signs Time Taken: 08:04 Temperature (F): 98.1 Height (in): 68 Pulse (bpm): 75 Weight (lbs): 157 Respiratory Rate (breaths/min): 16 Body Mass Index (BMI): 23.9 Blood Pressure (mmHg): 107/63 Reference Range: 80 - 120 mg / dl Electronic Signature(s) Signed: 06/01/2021 11:32:43 AM By: Sandre Kitty Entered By: Sandre Kitty on 05/26/2021 08:05:15

## 2021-06-02 ENCOUNTER — Other Ambulatory Visit: Payer: Self-pay

## 2021-06-02 ENCOUNTER — Ambulatory Visit (HOSPITAL_COMMUNITY)
Admission: RE | Admit: 2021-06-02 | Discharge: 2021-06-02 | Disposition: A | Discharge status: Home / Self Care | Payer: Medicare Other | Source: Ambulatory Visit | Attending: Internal Medicine | Admitting: Internal Medicine

## 2021-06-02 ENCOUNTER — Other Ambulatory Visit: Payer: Self-pay | Admitting: Internal Medicine

## 2021-06-02 ENCOUNTER — Other Ambulatory Visit (HOSPITAL_COMMUNITY): Payer: Self-pay

## 2021-06-02 DIAGNOSIS — C7931 Secondary malignant neoplasm of brain: Secondary | ICD-10-CM | POA: Diagnosis present

## 2021-06-02 MED ORDER — HEPARIN SOD (PORK) LOCK FLUSH 100 UNIT/ML IV SOLN
500.0000 [IU] | INTRAVENOUS | Status: AC | PRN
  Administered 2021-06-02: 500 [IU]
  Filled 2021-06-02: qty 5

## 2021-06-02 MED ORDER — GADOBUTROL 1 MMOL/ML IV SOLN
6.5000 mL | Freq: Once | INTRAVENOUS | Status: AC | PRN
  Administered 2021-06-02: 6.5 mL via INTRAVENOUS

## 2021-06-03 ENCOUNTER — Encounter (HOSPITAL_BASED_OUTPATIENT_CLINIC_OR_DEPARTMENT_OTHER): Payer: Medicare Other | Admitting: Internal Medicine

## 2021-06-03 DIAGNOSIS — L97812 Non-pressure chronic ulcer of other part of right lower leg with fat layer exposed: Secondary | ICD-10-CM | POA: Diagnosis not present

## 2021-06-03 NOTE — Progress Notes (Signed)
GENELLA, SCHOUTEN ([id number]) Visit Report for 06/03/2021 [place] Details Patient Name: Date of Service: [name] Riley, [name] Riley 06/03/2021 Medical Record Number: [id number] Patient Account Number: [id number] Date of Birth/Sex: Treating RN: Feb 21, 1951 (70 y.o. 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

## 2021-06-04 ENCOUNTER — Other Ambulatory Visit: Payer: Self-pay

## 2021-06-04 MED ORDER — LEVOFLOXACIN 500 MG PO TABS: 500.0000 mg | ORAL_TABLET | Freq: Every day | ORAL | 0 refills | Status: AC

## 2021-06-04 NOTE — Progress Notes (Signed)
Pt called and reports painful urination that started today. Per Dr Lindi Adie, Levaquin 500 mg 1 tab PO QD for 7 days sent in to pt's preferred phx. Pt verbalized thanks and understanding.

## 2021-06-07 ENCOUNTER — Inpatient Hospital Stay: Payer: Medicare Other

## 2021-06-09 ENCOUNTER — Encounter (HOSPITAL_BASED_OUTPATIENT_CLINIC_OR_DEPARTMENT_OTHER): Payer: Medicare Other | Admitting: Physician Assistant

## 2021-06-09 ENCOUNTER — Other Ambulatory Visit: Payer: Self-pay

## 2021-06-09 DIAGNOSIS — L97812 Non-pressure chronic ulcer of other part of right lower leg with fat layer exposed: Secondary | ICD-10-CM | POA: Diagnosis not present

## 2021-06-09 NOTE — Progress Notes (Signed)
[name], [name] ([id number]) Visit Report for 06/09/2021 Arrival Information Details Patient Name: Date of Service: [name] Riley, [name] Riley 06/09/2021 8:15 A M Medical Record Number: [id number] Patient Account Number: [id number] Date of Birth/Sex: Treating RN: 1951/06/16 (70 y.o. [name] Primary Care Rosanna Bickle: [name] Other Clinician: Referring Karol Liendo: Treating Amarie Tarte/Extender: Vickii Penna in Treatment: 8 Visit Information History Since Last Visit Added or deleted any medications: No Patient Arrived: Ambulatory Any new allergies or adverse reactions: No Arrival Time: 07:50 Had a fall or experienced change in No Transfer Assistance: None activities of daily living that may affect Patient Identification Verified: Yes risk of falls: Secondary Verification Process Completed: Yes Signs or symptoms of abuse/neglect since last visito No Patient Requires Transmission-Based Precautions: No Hospitalized since last visit: No Patient Has Alerts: No Implantable device outside of the clinic excluding No cellular tissue based products placed in the center since last visit: Has Dressing in Place as Prescribed: Yes Has Compression in Place as Prescribed: Yes Pain Present Now: No Electronic Signature(s) Signed: 06/09/2021 5:22:48 PM By: [name] Entered By: [name] on 06/09/2021 07:51:41 -------------------------------------------------------------------------------- Compression Therapy Details Patient Name: Date of Service: [name], [name] 06/09/2021 8:15 A M Medical Record Number: [id number] Patient Account Number: [id number] Date of Birth/Sex: Treating RN: Sep 21, 1951 (70 y.o. [name] Primary Care Selestino Nila: [name] Other Clinician: Referring Kimanh Templeman: Treating Shamecka Hocutt/Extender: Vickii Penna in Treatment: 8 Compression Therapy Performed for Wound Assessment: Wound #1 Right,Anterior Lower  Leg Performed By: Clinician [name], RN Compression Type: Three Layer Post Procedure Diagnosis Same as Pre-procedure Electronic Signature(s) Signed: 06/09/2021 5:22:48 PM By: [name] Entered By: [name] on 06/09/2021 08:21:12 -------------------------------------------------------------------------------- Compression Therapy Details Patient Name: Date of Service: [name], [name] 06/09/2021 8:15 A M Medical Record Number: [id number] Patient Account Number: [id number] Date of Birth/Sex: Treating RN: November 15, 1950 (70 y.o. [name] Primary Care Audrey Thull: [name] Other Clinician: Referring Meron Bocchino: Treating Dearl Rudden/Extender: Vickii Penna in Treatment: 8 Compression Therapy Performed for Wound Assessment: Wound #2 Right,Lateral Lower Leg Performed By: Clinician [name], RN Compression Type: Three Layer Post Procedure Diagnosis Same as Pre-procedure Electronic Signature(s) Signed: 06/09/2021 5:22:48 PM By: [name] Entered By: [name] on 06/09/2021 08:21:13 -------------------------------------------------------------------------------- Encounter Discharge Information Details Patient Name: Date of Service: [place], [name] 06/09/2021 8:15 A M Medical Record Number: [id number] Patient Account Number: [id number] Date of Birth/Sex: Treating RN: June 12, 1951 (70 y.o. [name] Primary Care Kae Lauman: [name] Other Clinician: Referring Sabastien Tyler: Treating Lillyauna Jenkinson/Extender: Vickii Penna in Treatment: 8 Encounter Discharge Information Items Discharge Condition: Stable Ambulatory Status: Ambulatory Discharge Destination: Home Transportation: Private Auto Schedule Follow-up Appointment: Yes Clinical Summary of Care: Provided on 06/09/2021 Form Type Recipient Paper Patient Patient Electronic Signature(s) Signed: 06/09/2021 5:22:48 PM By: [name] Entered By:  [name] on 06/09/2021 08:36:15 -------------------------------------------------------------------------------- Lower Extremity Assessment Details Patient Name: Date of Service: [name], [name] 06/09/2021 8:15 A M Medical Record Number: [id number] Patient Account Number: [id number] Date of Birth/Sex: Treating RN: [birth year] (70 y.o. [name] Primary Care Miyuki Rzasa: [name] Other Clinician: Referring Amilliana Hayworth: Treating Velma Agnes/Extender: Vickii Penna in Treatment: 8 Edema Assessment Assessed: [Left: No] [Right: Yes] Edema: [Left: Ye] [Right: s] Calf Left: Right: Point of Measurement: 29 cm From Medial Instep 33.5 cm Ankle Left: Right: Point of Measurement: 10 cm From Medial Instep 20.5 cm  Vascular Assessment Pulses: Dorsalis Pedis Palpable: [Right:Yes] Electronic Signature(s) Signed: 06/09/2021 5:22:48 PM By: [name] Entered By: [name] on 06/09/2021 08:00:19 -------------------------------------------------------------------------------- Multi-Disciplinary Care Plan Details Patient Name: Date of Service: [name], [name] 06/09/2021 8:15 A M Medical Record Number: [id number] Patient Account Number: [id number] Date of Birth/Sex: Treating RN: [birth year] (70 y.o. [name] Primary Care Donna Silverman: [name] Other Clinician: Referring Jylan Loeza: Treating Brayley Mackowiak/Extender: Vickii Penna in Treatment: [place] reviewed with physician Active Inactive Abuse / Safety / Falls / Self Care Management Nursing Diagnoses: Potential for falls Goals: Patient/caregiver will verbalize/demonstrate measures taken to prevent injury and/or falls Date Initiated: 04/14/2021 Target Resolution Date: 07/07/2021 Goal Status: Active Interventions: Assess fall risk on admission and as needed Notes: 05/19/21 pt fell yesterday and bumped leg Venous Leg Ulcer Nursing  Diagnoses: Knowledge deficit related to disease process and management Potential for venous Insuffiency (use before diagnosis confirmed) Goals: Patient will maintain optimal edema control Date Initiated: 04/14/2021 Target Resolution Date: 07/07/2021 Goal Status: Active Interventions: Assess peripheral edema status every visit. Compression as ordered Provide education on venous insufficiency Treatment Activities: Therapeutic compression applied : 04/14/2021 Notes: 06/09/21: Edema control ongoing. Wound/Skin Impairment Nursing Diagnoses: Impaired tissue integrity Knowledge deficit related to ulceration/compromised skin integrity Goals: Patient/caregiver will verbalize understanding of skin care regimen Date Initiated: 04/14/2021 Target Resolution Date: 07/07/2021 Goal Status: Active Ulcer/skin breakdown will have a volume reduction of 30% by week 4 Date Initiated: 04/14/2021 Date Inactivated: 05/12/2021 Target Resolution Date: 05/12/2021 Goal Status: Met Ulcer/skin breakdown will have a volume reduction of 50% by week 8 Date Initiated: 05/12/2021 Date Inactivated: 06/09/2021 Target Resolution Date: 06/09/2021 Goal Status: Met Ulcer/skin breakdown will have a volume reduction of 80% by week 12 Date Initiated: 06/09/2021 Target Resolution Date: 07/07/2021 Goal Status: Active Interventions: Assess patient/caregiver ability to obtain necessary supplies Assess patient/caregiver ability to perform ulcer/skin care regimen upon admission and as needed Assess ulceration(s) every visit Provide education on ulcer and skin care Treatment Activities: Skin care regimen initiated : 04/14/2021 Topical wound management initiated : 04/14/2021 Notes: Electronic Signature(s) Signed: 06/09/2021 5:22:48 PM By: [name] Entered By: [name] on 06/09/2021 08:19:26 -------------------------------------------------------------------------------- Pain Assessment Details Patient Name: Date of  Service: [place], [name] 06/09/2021 8:15 A M Medical Record Number: [id number] Patient Account Number: [id number] Date of Birth/Sex: Treating RN: 11-03-1950 (70 y.o. [name] Primary Care Arkeem Harts: [name] Other Clinician: Referring Jersi Mcmaster: Treating Eleena Grater/Extender: Vickii Penna in Treatment: 8 Active Problems Location of Pain Severity and Description of Pain Patient Has Paino No Site Locations Pain Management and Medication Current Pain Management: Electronic Signature(s) Signed: 06/09/2021 5:22:48 PM By: [name] Entered By: [name] on 06/09/2021 07:51:50 -------------------------------------------------------------------------------- Patient/Caregiver Education Details Patient Name: Date of Service: [name] 8/24/2022andnbsp8:15 A M Medical Record Number: [id number] Patient Account Number: [id number] Date of Birth/Gender: Treating RN: 02/22/1951 (70 y.o. [name] Primary Care Physician: [name] Other Clinician: Referring Physician: Treating Physician/Extender: Vickii Penna in Treatment: 8 Education Assessment Education Provided To: Patient Education Topics Provided Venous: Methods: Explain/Verbal, Printed Responses: State content correctly Wound/Skin Impairment: Methods: Explain/Verbal, Printed Responses: State content correctly Electronic Signature(s) Signed: 06/09/2021 5:22:48 PM By: [name] Entered By: [name] on 06/09/2021 07:54:20 -------------------------------------------------------------------------------- Wound Assessment Details Patient Name: Date of Service: [place], [name] 06/09/2021 8:15 A M Medical Record Number: [id number] Patient Account Number: [id number] Date of Birth/Sex: Treating  RN: May 13, 1951 (70 y.o. [name] Primary Care Braidyn Scorsone: [name] Other Clinician: Referring Kaden Daughdrill: Treating  Zyon Grout/Extender: Vickii Penna in Treatment: 8 Wound Status Wound Number: 1 Primary Trauma, Other Etiology: Wound Location: Right, Anterior Lower Leg Wound Open Wounding Event: Trauma Status: Date Acquired: 03/17/2021 Comorbid Coronary Artery Disease, Hypertension, Myocardial Infarction, Weeks Of Treatment: 8 History: Neuropathy, Received Chemotherapy, Received Radiation Clustered Wound: No Photos Wound Measurements Length: (cm) 0.4 Width: (cm) 0.4 Depth: (cm) 0.2 Area: (cm) 0.126 Volume: (cm) 0.025 % Reduction in Area: 92.4% % Reduction in Volume: 84.8% Epithelialization: Medium (34-66%) Tunneling: No Undermining: No Wound Description Classification: Full Thickness Without Exposed Support Structures Wound Margin: Distinct, outline attached Exudate Amount: Medium Exudate Type: Serosanguineous Exudate Color: red, brown Foul Odor After Cleansing: No Slough/Fibrino No Wound Bed Granulation Amount: Large (67-100%) Exposed Structure Granulation Quality: Red, Pink Fascia Exposed: No Necrotic Amount: None Present (0%) Fat Layer (Subcutaneous Tissue) Exposed: Yes Tendon Exposed: No Muscle Exposed: No Joint Exposed: No Bone Exposed: No Treatment Notes Wound #1 (Lower Leg) Wound Laterality: Right, Anterior Cleanser Peri-Wound Care Sween Lotion (Moisturizing lotion) Discharge Instruction: Apply moisturizing lotion as directed Topical Primary Dressing Xeroform Occlusive Gauze Dressing, 4x4 in Discharge Instruction: Apply to wound bed as instructed Secondary Dressing Woven Gauze Sponge, Non-Sterile 4x4 in Discharge Instruction: Apply over primary dressing as directed. Secured With Compression Wrap ThreePress (3 layer compression wrap) Discharge Instruction: Apply three layer compression as directed. Compression Stockings Add-Ons Electronic Signature(s) Signed: 06/09/2021 5:22:48 PM By: [name] Entered By: [name] on 06/09/2021  08:20:50 -------------------------------------------------------------------------------- Wound Assessment Details Patient Name: Date of Service: [place], [name] 06/09/2021 8:15 A M Medical Record Number: [id number] Patient Account Number: [id number] Date of Birth/Sex: Treating RN: 1951-07-15 (70 y.o. [name] Primary Care Leshea Jaggers: [name] Other Clinician: Referring Elexis Pollak: Treating Tylyn Stankovich/Extender: Vickii Penna in Treatment: 8 Wound Status Wound Number: 2 Primary Trauma, Other Etiology: Wound Location: Right, Lateral Lower Leg Wound Open Wounding Event: Trauma Status: Date Acquired: 05/15/2021 Comorbid Coronary Artery Disease, Hypertension, Myocardial Infarction, Weeks Of Treatment: 3 History: Neuropathy, Received Chemotherapy, Received Radiation Clustered Wound: No Photos Wound Measurements Length: (cm) 0.3 Width: (cm) 0.4 Depth: (cm) 0.1 Area: (cm) 0.094 Volume: (cm) 0.009 % Reduction in Area: 66.8% % Reduction in Volume: 67.9% Epithelialization: None Tunneling: No Undermining: No Wound Description Classification: Full Thickness Without Exposed Support Structures Wound Margin: Distinct, outline attached Exudate Amount: Medium Exudate Type: Sanguinous Exudate Color: red Foul Odor After Cleansing: No Slough/Fibrino No Wound Bed Granulation Amount: Large (67-100%) Exposed Structure Granulation Quality: Red Fascia Exposed: No Necrotic Amount: None Present (0%) Fat Layer (Subcutaneous Tissue) Exposed: Yes Tendon Exposed: No Muscle Exposed: No Joint Exposed: No Bone Exposed: No Treatment Notes Wound #2 (Lower Leg) Wound Laterality: Right, Lateral Cleanser Peri-Wound Care Sween Lotion (Moisturizing lotion) Discharge Instruction: Apply moisturizing lotion as directed Topical Primary Dressing Xeroform Occlusive Gauze Dressing, 4x4 in Discharge Instruction: Apply to wound bed as instructed Secondary  Dressing Woven Gauze Sponge, Non-Sterile 4x4 in Discharge Instruction: Apply over primary dressing as directed. Secured With Compression Wrap ThreePress (3 layer compression wrap) Discharge Instruction: Apply three layer compression as directed. Compression Stockings Add-Ons Electronic Signature(s) Signed: 06/09/2021 5:22:48 PM By: [name] Entered By: [name] on 06/09/2021 08:20:40 -------------------------------------------------------------------------------- Vitals Details Patient Name: Date of Service: [place], [name] 06/09/2021 8:15 A M Medical Record Number: [id number] Patient Account Number: [id number] Date of Birth/Sex: Treating RN: Jan 26, 1951 (70 y.o. F)  [name] Primary Care Shiza Thelen: [name] Other Clinician: Referring Marylin Lathon: Treating Beckham Buxbaum/Extender: Vickii Penna in Treatment: 8 Vital Signs Time Taken: 07:58 Temperature (F): 98.3 Height (in): 68 Pulse (bpm): 93 Weight (lbs): 157 Respiratory Rate (breaths/min): 16 Body Mass Index (BMI): 23.9 Blood Pressure (mmHg): 106/65 Reference Range: 80 - 120 mg / dl Electronic Signature(s) Signed: 06/09/2021 5:22:48 PM By: [name] Entered By: [name] on 06/09/2021 07:59:15

## 2021-06-09 NOTE — Progress Notes (Signed)
[name], [name] ([id number]) Visit Report for 06/09/2021 Chief Complaint Document Details Patient Name: Date of Service: [name] Riley, [name] Riley 06/09/2021 8:15 A M Medical Record Number: [id number] Patient Account Number: [id number] Date of Birth/Sex: Treating RN: [birth year] (70 y.o. [name] Riley Primary Care Provider: [name] Other Clinician: Referring Provider: Treating Provider/Extender: Vickii Penna in Treatment: 8 Information Obtained from: Patient Chief Complaint Right LE Ulcer Electronic Signature(s) Signed: 06/09/2021 8:26:43 AM By: Worthy Keeler PA-C Entered By: Worthy Keeler on 06/09/2021 08:26:43 -------------------------------------------------------------------------------- HPI Details Patient Name: Date of Service: [name] Riley RO L A. 06/09/2021 8:15 A M Medical Record Number: [id number] Patient Account Number: [id number] Date of Birth/Sex: Treating RN: 1951/06/06 (70 y.o. [name] Riley Primary Care Provider: [name] Other Clinician: Referring Provider: Treating Provider/Extender: Vickii Penna in Treatment: 8 History of Present Illness HPI Description: 04/14/2021 upon evaluation today patient presents for initial inspection here in the clinic concerning issues that she has been having with a wound on her right anterior lower leg after she had an abrasion trauma 4 weeks ago. She tells me that unfortunately following this trauma she has been experiencing quite a bit of discomfort at times with some burning and stinging. She is also had a scab up once or twice but states that right now she has been unable to really get it to scab back over. This has been a frustration for her and she is not really able to get it to heal as quickly and effectively as she would like to see. The patient also tells me that she has been tolerating the dressing changes without complication. She has just been using over-the-counter  antibiotic ointment and try to keep it covered with a dry dressing at times depending on what she was told to do. Nonetheless right now she is also been on Keflex that did not really seem to help she was switched over to Augmentin she does started that on Friday. Currently I do not see anything that seems to be obviously infected but nonetheless I think it is fine for her to take the Augmentin just to make sure that we do not get anything that initiates in the interim. The patient is currently undergoing chemotherapy she has had previous radiation. Nonetheless in regard to the chemotherapy this could be affecting her ability to heal to some degree. 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 She has hypertension, neuropathy associated with the chemotherapy, and a history of heart disease. She is currently again undergoing active chemotherapy for recurrent breast cancer. 04/21/2021 upon evaluation today patient appears to be doing well with regard to her leg ulcer. This is actually looking significantly improved. There does not appear to be any signs of infection visually and I am happy in that regard. Fortunately there is no signs of systemic infection either which is also great news. No fevers, chills, nausea, vomiting, or diarrhea. 05/05/2021 upon evaluation today patient appears to be doing well with regard to her leg ulcer. She has been tolerating the dressing changes without complication. Fortunately there does not appear to be any signs of infection which is great news. No fevers, chills, nausea, vomiting, or diarrhea. 05/12/2021 upon evaluation today patient actually appears to be making good improvement in general. I am extremely pleased with where things stand today. There is no signs of active infection which  is also great news. 05/19/2021 upon evaluation today patient appears to be doing  well with regard to her wound. This is actually showing signs of excellent granulation epithelization at this point. I do not see any evidence of infection which is great and overall I am extremely pleased with where we stand today. In general I think that the patient is making leasing strides towards getting this healed. The issue that she had unfortunately her mother did pass this past week. She was at the wedding yesterday when she actually felt faint coming out of the church there is a lot of emotional situation involved obviously. Nonetheless she somewhat started to pass out her brothers caught her but she did hit her leg laterally underneath the wrap on the side of the steps. This caused a small skin tear and some contusion fortunately this is not too bad but nonetheless I do think that this needs to be monitored currently. 05/26/2021 upon evaluation today patient appears to be doing excellent in regard to her wound on the leg. This is can require some sharp debridement but in general I feel like she is doing very well. The new skin tear that was noted last week is actually healing quite nicely reattached and has done great I think this will probably be pretty much resolved by next week and then should start just even an hour after that. As far as the color of the skin is concerned the bruising will fade is what I mean by that. 06/05/2021 upon evaluation today patient appears to be doing well with regard to her wound. She has been tolerating the dressing changes without complication. Fortunately there does not appear to be any signs of infection the good news is she is actually healing quite nicely and very close to resolution on the anterior wound and the new wound that was on the right last time where she had struck this during almost collapsing at her mom's funeral actually seems to be doing better although there is still a small opening here as well. Electronic Signature(s) Signed: 06/09/2021  8:26:51 AM By: Worthy Keeler PA-C Entered By: Worthy Keeler on 06/09/2021 08:26:50 -------------------------------------------------------------------------------- Physical Exam Details Patient Name: Date of Service: [name], Riley RO L A. 06/09/2021 8:15 A M Medical Record Number: [id number] Patient Account Number: [id number] Date of Birth/Sex: Treating RN: 1951-06-29 (70 y.o. [name] Riley Primary Care Provider: [name] Other Clinician: Referring Provider: Treating Provider/Extender: Vickii Penna in Treatment: 8 Constitutional Well-nourished and well-hydrated in no acute distress. Respiratory normal breathing without difficulty. Psychiatric this patient is able to make decisions and demonstrates good insight into disease process. Alert and Oriented x 3. pleasant and cooperative. Notes Upon inspection patient's wound bed showed signs of good granulation epithelization at this point. Fortunately there does not appear to be any evidence of active infection which is great news and overall I am extremely pleased with where things stand today. No fevers, chills, nausea, vomiting, or diarrhea. Electronic Signature(s) Signed: 06/09/2021 8:27:14 AM By: Worthy Keeler PA-C Entered By: Worthy Keeler on 06/09/2021 08:27:14 -------------------------------------------------------------------------------- Physician Orders Details Patient Name: Date of Service: Stocking, Riley RO L A. 06/09/2021 8:15 A M Medical Record Number: [id number] Patient Account Number: [id number] Date of Birth/Sex: Treating RN: [birth year] (70 y.o. 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 - with Margarita Grizzle Return A Bathing/ Shower/ Hygiene May shower with protection but do not get wound dressing(s) wet. Edema Control - Lymphedema / SCD / Other Right Lower Extremity Elevate legs to the level of the heart or above for 30 minutes daily and/or when sitting, a frequency of: - 3-4 times per day Avoid standing for long periods of time. Exercise regularly Additional Orders / Instructions Follow Nutritious Diet Wound Treatment Wound #1 - Lower Leg Wound Laterality: Right, Anterior Peri-Wound Care: Sween Lotion (Moisturizing lotion) 1 x Per Week/30 Days Discharge Instructions: Apply moisturizing lotion as directed Prim Dressing: Xeroform Occlusive Gauze Dressing, 4x4 in 1 x Per Week/30 Days ary Discharge Instructions: Apply to wound bed as instructed Secondary Dressing: Woven Gauze Sponge, Non-Sterile 4x4 in 1 x Per Week/30 Days Discharge Instructions: Apply over primary dressing as directed. Compression Wrap: ThreePress (3 layer compression wrap) 1 x Per Week/30 Days Discharge Instructions: Apply three layer compression as directed. Wound #2 - Lower Leg Wound Laterality: Right, Lateral Peri-Wound Care: Sween Lotion (Moisturizing lotion) 1 x Per Week/30 Days Discharge Instructions: Apply moisturizing lotion as directed Prim Dressing: Xeroform Occlusive Gauze Dressing, 4x4 in 1 x Per Week/30 Days ary Discharge Instructions: Apply to wound bed as instructed Secondary Dressing: Woven Gauze Sponge, Non-Sterile 4x4 in 1 x Per Week/30 Days Discharge Instructions:  Apply over primary dressing as directed. Compression Wrap: ThreePress (3 layer compression wrap) 1 x Per Week/30 Days Discharge Instructions: Apply three layer compression as directed. Electronic Signature(s) Signed: 06/09/2021 5:22:48 PM By: [name] Signed: 06/09/2021 6:37:42 PM By: Worthy Keeler PA-C Entered By: [name] on 06/09/2021 08:21:47 -------------------------------------------------------------------------------- Problem List Details Patient Name: Date of Service: Brandis, Riley RO L A. 06/09/2021 8:15 A M Medical Record Number: [id number] Patient Account Number: [id number] Date of Birth/Sex: Treating RN: 1951/02/01 (70 y.o. [name] Primary Care Provider: [name] Other Clinician: Referring Provider: Treating Provider/Extender: Vickii Penna in Treatment: 8 Active Problems ICD-10 Encounter Code Description Active Date MDM Diagnosis S80.811A Abrasion, right lower leg, initial encounter 04/14/2021 No Yes I87.2 Venous insufficiency (chronic) (peripheral) 04/14/2021 No Yes L97.812 Non-pressure chronic ulcer of other part of right lower leg with fat layer 04/14/2021 No Yes exposed G90.09 Other idiopathic peripheral autonomic neuropathy 04/14/2021 No Yes C79.81 Secondary malignant neoplasm of breast 04/14/2021 No Yes I10 Essential (primary) hypertension 04/14/2021 No Yes I25.10 Atherosclerotic heart disease of native coronary artery without angina pectoris 04/14/2021 No Yes Z92.21 Personal history of antineoplastic chemotherapy 04/14/2021 No Yes Inactive Problems Resolved Problems Electronic Signature(s) Signed: 06/09/2021 8:26:33 AM By: Worthy Keeler PA-C Entered By: Worthy Keeler on 06/09/2021 08:26:33 -------------------------------------------------------------------------------- Progress Note Details Patient Name: Date of Service: Chisenhall, Riley RO L A. 06/09/2021 8:15 A M Medical Record Number: [id number] Patient Account  Number: [id number] Date of Birth/Sex: Treating RN: 24-Dec-1950 (70 y.o. [name] Riley Primary Care Provider: [name] Other Clinician: Referring Provider: Treating Provider/Extender: Vickii Penna in Treatment: 8 Subjective Chief Complaint Information obtained from Patient Right LE Ulcer History of Present Illness (HPI) 04/14/2021 upon evaluation today patient presents for  initial inspection here in the clinic concerning issues that she has been having with a wound on her right anterior lower leg after she had an abrasion trauma 4 weeks ago. She tells me that unfortunately following this trauma she has been experiencing quite a bit of discomfort at times with some burning and stinging. She is also had a scab up once or twice but states that right now she has been unable to really get it to scab back over. This has been a frustration for her and she is not really able to get it to heal as quickly and effectively as she would like to see. The patient also tells me that she has been tolerating the dressing changes without complication. She has just been using over-the-counter antibiotic ointment and try to keep it covered with a dry dressing at times depending on what she was told to do. Nonetheless right now she is also been on Keflex that did not really seem to help she was switched over to Augmentin she does started that on Friday. Currently I do not see anything that seems to be obviously infected but nonetheless I think it is fine for her to take the Augmentin just to make sure that we do not get anything that initiates in the interim. The patient is currently undergoing chemotherapy she has had previous radiation. Nonetheless in regard to the chemotherapy this could be affecting her ability to heal to some degree. 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 She has hypertension, neuropathy associated with the chemotherapy, and a history of heart disease. She is currently again undergoing active chemotherapy for recurrent breast cancer. 04/21/2021 upon evaluation today patient appears to be doing well with regard to her leg ulcer. This is actually looking significantly improved. There does not appear to be any signs of infection visually and I am happy in that regard. Fortunately there is no signs of systemic infection either which is also great news. No fevers, chills, nausea, vomiting, or diarrhea. 05/05/2021 upon evaluation today patient appears to be doing well with regard to her leg ulcer. She has been tolerating the dressing changes without complication. Fortunately there does not appear to be any signs of infection which is great news. No fevers, chills, nausea, vomiting, or diarrhea. 05/12/2021 upon evaluation today patient actually appears to be making good improvement in general. I am extremely pleased with where things stand today. There is no signs of active infection which is also great news. 05/19/2021 upon evaluation today patient appears to be doing well with regard to her wound. This is actually showing signs of excellent granulation epithelization at this point. I do not see any evidence of infection which is great and overall I am extremely pleased with where we stand today. In general I think that the patient is making leasing strides towards getting this healed. The issue that she had unfortunately her mother did pass this past week. She was at the wedding yesterday when she actually felt faint coming out of the church there is a lot of emotional situation involved obviously. Nonetheless she somewhat started to pass out her brothers caught her but she did hit her leg laterally underneath the wrap on the side of the steps. This caused a small skin tear and some contusion fortunately this is not too bad but nonetheless I do think that  this needs to be monitored currently. 05/26/2021  upon evaluation today patient appears to be doing excellent in regard to her wound on the leg. This is can require some sharp debridement but in general I feel like she is doing very well. The new skin tear that was noted last week is actually healing quite nicely reattached and has done great I think this will probably be pretty much resolved by next week and then should start just even an hour after that. As far as the color of the skin is concerned the bruising will fade is what I mean by that. 06/05/2021 upon evaluation today patient appears to be doing well with regard to her wound. She has been tolerating the dressing changes without complication. Fortunately there does not appear to be any signs of infection the good news is she is actually healing quite nicely and very close to resolution on the anterior wound and the new wound that was on the right last time where she had struck this during almost collapsing at her mom's funeral actually seems to be doing better although there is still a small opening here as well. Objective Constitutional Well-nourished and well-hydrated in no acute distress. Vitals Time Taken: 7:58 AM, Height: 68 in, Weight: 157 lbs, BMI: 23.9, Temperature: 98.3 F, Pulse: 93 bpm, Respiratory Rate: 16 breaths/min, Blood Pressure: 106/65 mmHg. Respiratory normal breathing without difficulty. Psychiatric this patient is able to make decisions and demonstrates good insight into disease process. Alert and Oriented x 3. pleasant and cooperative. General Notes: Upon inspection patient's wound bed showed signs of good granulation epithelization at this point. Fortunately there does not appear to be any evidence of active infection which is great news and overall I am extremely pleased with where things stand today. No fevers, chills, nausea, vomiting, or diarrhea. Integumentary (Hair, Skin) Wound #1 status is Open. Original  cause of wound was Trauma. The date acquired was: 03/17/2021. The wound has been in treatment 8 weeks. The wound is located on the Right,Anterior Lower Leg. The wound measures 0.4cm length x 0.4cm width x 0.2cm depth; 0.126cm^2 area and 0.025cm^3 volume. There is Fat Layer (Subcutaneous Tissue) exposed. There is no tunneling or undermining noted. There is a medium amount of serosanguineous drainage noted. The wound margin is distinct with the outline attached to the wound base. There is large (67-100%) red, pink granulation within the wound bed. There is no necrotic tissue within the wound bed. Wound #2 status is Open. Original cause of wound was Trauma. The date acquired was: 05/15/2021. The wound has been in treatment 3 weeks. The wound is located on the Right,Lateral Lower Leg. The wound measures 0.3cm length x 0.4cm width x 0.1cm depth; 0.094cm^2 area and 0.009cm^3 volume. There is Fat Layer (Subcutaneous Tissue) exposed. There is no tunneling or undermining noted. There is a medium amount of sanguinous drainage noted. The wound margin is distinct with the outline attached to the wound base. There is large (67-100%) red granulation within the wound bed. There is no necrotic tissue within the wound bed. 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 There was a Three Layer Compression Therapy Procedure by  [name], RN. Post procedure Diagnosis Wound #1: Same as  Pre-Procedure Wound #2 Pre-procedure diagnosis of Wound #2 is a Trauma, Other located on the Right,Lateral Lower Leg . There was a Three Layer Compression Therapy Procedure by [name], RN. Post procedure Diagnosis Wound #2: Same as Pre-Procedure Plan Follow-up Appointments: Return Appointment in 1 week. - with Glynn Octave Shower/ Hygiene: May shower with protection but do not get wound dressing(s) wet. Edema Control - Lymphedema / SCD / Other: Elevate legs to the level of the heart or above for 30 minutes daily and/or when sitting, a frequency of: - 3-4 times per day Avoid standing for long periods of time. Exercise regularly Additional Orders / Instructions: Follow Nutritious Diet WOUND #1: - Lower Leg Wound Laterality: Right, Anterior Peri-Wound Care: Sween Lotion (Moisturizing lotion) 1 x Per Week/30 Days Discharge Instructions: Apply moisturizing lotion as directed Prim Dressing: Xeroform Occlusive Gauze Dressing, 4x4 in 1 x Per Week/30 Days ary Discharge Instructions: Apply to wound bed as instructed Secondary Dressing: Woven Gauze Sponge, Non-Sterile 4x4 in 1 x Per Week/30 Days Discharge Instructions: Apply over primary dressing as directed. Com pression Wrap: ThreePress (3 layer compression wrap) 1 x Per Week/30 Days Discharge Instructions: Apply three layer compression as directed. WOUND #2: - Lower Leg Wound Laterality: Right, Lateral Peri-Wound Care: Sween Lotion (Moisturizing lotion) 1 x Per Week/30 Days Discharge Instructions: Apply moisturizing lotion as directed Prim Dressing: Xeroform Occlusive Gauze Dressing, 4x4 in 1 x Per Week/30 Days ary Discharge Instructions: Apply to wound bed as instructed Secondary Dressing: Woven Gauze Sponge, Non-Sterile 4x4 in 1 x Per Week/30 Days Discharge Instructions: Apply over primary dressing as directed. Com pression Wrap: ThreePress (3 layer compression wrap) 1 x Per Week/30 Days Discharge Instructions: Apply three layer  compression as directed. 1. Would recommend currently that we going to continue with the wound care measures as before and the patient is in agreement the plan. This includes the use of the Xeroform gauze which we use over both locations. Both are doing well and I think this still a good option to continue. 2. I am also going to recommend that we continue with a 3 layer compression wrap which I think is probably still the best way to go. 3. I am also can recommend that we have the patient continue to as much as possible elevate her leg to help with edema control although her legs not too swollen she seems to be doing quite well in that regard. We will see patient back for reevaluation in 1 week here in the clinic. If anything worsens or changes patient will contact our office for additional recommendations. Electronic Signature(s) Signed: 06/09/2021 8:28:07 AM By: Worthy Keeler PA-C Entered By: Worthy Keeler on 06/09/2021 08:28:06 -------------------------------------------------------------------------------- SuperBill Details Patient Name: Date of Service: Ragon, Riley RO L A. 06/09/2021 Medical Record Number: [id number] Patient Account Number: [id number] Date of Birth/Sex: Treating RN: 12/18/1950 (70 y.o. 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 C79.81 Secondary malignant neoplasm of breast I10 Essential (primary) hypertension I25.10 Atherosclerotic heart disease of native coronary artery without angina pectoris Z92.21 Personal history of antineoplastic  chemotherapy Facility Procedures CPT4 Code: [id number] Description: (Facility  Use Only) [id number] - APPLY MULTLAY COMPRS LWR RT LEG ICD-10 Diagnosis Description [id number] Non-pressure chronic ulcer of other part of right lower leg with fat layer exposed I87.2 Venous insufficiency (chronic) (peripheral) Modifier: Quantity: 1 Physician Procedures : CPT4 Code Description Modifier [id number] - WC PHYS LEVEL 3 - EST PT ICD-10 Diagnosis Description S80.811A Abrasion, right lower leg, initial encounter I87.2 Venous insufficiency (chronic) (peripheral) L97.812 Non-pressure chronic ulcer of other  part of right lower leg with fat layer exposed 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

## 2021-06-09 NOTE — Progress Notes (Signed)
MELEANE, DEOLIVEIRA ([id number]) Visit Report for 06/03/2021 Arrival Information Details Patient Name: Date of Service: VERDINE, DRING A. 06/03/2021 9:00 A M Medical Record Number: [id number] Patient Account Number: [id number] Date of Birth/Sex: Treating RN: [birth year] (70 y.o. Helene Shoe, Meta.Reding Primary Care Ziyon Cedotal: [name] Other Clinician: Referring Davyn Morandi: Treating Nakeshia Waldeck/Extender: Neysa Hotter in Treatment: 7 Visit Information History Since Last Visit Added or deleted any medications: No Patient Arrived: Ambulatory Any new allergies or adverse reactions: No Arrival Time: 08:41 Had a fall or experienced change in No Accompanied By: self activities of daily living that may affect Transfer Assistance: None risk of falls: Patient Identification Verified: Yes Signs or symptoms of abuse/neglect since last visito No Secondary Verification Process Completed: Yes Hospitalized since last visit: No Patient Requires Transmission-Based Precautions: No Implantable device outside of the clinic excluding No Patient Has Alerts: No cellular tissue based products placed in the center since last visit: Has Dressing in Place as Prescribed: Yes Pain Present Now: No Electronic Signature(s) Signed: 06/09/2021 9:12:52 AM By: Sandre Kitty Entered By: Sandre Kitty on 06/03/2021 08:42:07 -------------------------------------------------------------------------------- Compression Therapy Details Patient Name: Date of Service: [place], [name] 06/03/2021 9:00 A M Medical Record Number: [id number] Patient Account Number: [id number] Date of Birth/Sex: Treating RN: [birth year] (70 y.o. Nancy Fetter Primary Care Rachit Grim: [name] Other Clinician: Referring Glori Machnik: Treating Roscoe Witts/Extender: Neysa Hotter in Treatment: 7 Compression Therapy Performed for Wound Assessment: Wound #1 Right,Anterior Lower Leg Performed By:  Clinician Levan Hurst, RN Compression Type: Three Hydrologist) Signed: 06/03/2021 6:20:17 PM By: Levan Hurst RN, BSN Entered By: Levan Hurst on 06/03/2021 09:13:28 -------------------------------------------------------------------------------- Compression Therapy Details Patient Name: Date of Service: [place], [name] 06/03/2021 9:00 A M Medical Record Number: [id number] Patient Account Number: [id number] Date of Birth/Sex: Treating RN: 07/26/1951 (70 y.o. Nancy Fetter Primary Care Saylor Sheckler: Other Clinician: [name] Referring Sheryle Vice: Treating Ahni Bradwell/Extender: Neysa Hotter in Treatment: 7 Compression Therapy Performed for Wound Assessment: Wound #2 Right,Lateral Lower Leg Performed By: Clinician Levan Hurst, RN Compression Type: Three Hydrologist) Signed: 06/03/2021 6:20:17 PM By: Levan Hurst RN, BSN Entered By: Levan Hurst on 06/03/2021 09:13:28 -------------------------------------------------------------------------------- Encounter Discharge Information Details Patient Name: Date of Service: [place], [name] 06/03/2021 9:00 A M Medical Record Number: [id number] Patient Account Number: [id number] Date of Birth/Sex: Treating RN: 09-16-1951 (70 y.o. Nancy Fetter Primary Care Marvella Jenning: [name] Other Clinician: Referring Jamier Urbas: Treating Albie Bazin/Extender: Neysa Hotter in Treatment: 7 Encounter Discharge Information Items Discharge Condition: Stable Ambulatory Status: Ambulatory Discharge Destination: Home Transportation: Private Auto Accompanied By: alone Schedule Follow-up Appointment: Yes Clinical Summary of Care: Patient Declined Electronic Signature(s) Signed: 06/03/2021 6:20:17 PM By: Levan Hurst RN, BSN Entered By: Levan Hurst on 06/03/2021  09:14:38 -------------------------------------------------------------------------------- Patient/Caregiver Education Details Patient Name: Date of Service: [place], [name] 8/18/2022andnbsp9:00 A M Medical Record Number: [id number] Patient Account Number: [id number] Date of Birth/Gender: Treating RN: 1951-04-15 (70 y.o. Nancy Fetter Primary Care Physician: [name] Other Clinician: Referring Physician: Treating Physician/Extender: Neysa Hotter in Treatment: 7 Education Assessment Education Provided To: Patient Education Topics Provided Wound/Skin Impairment: Methods: Explain/Verbal Responses: State content correctly Electronic Signature(s) Signed: 06/03/2021 6:20:17 PM By: Levan Hurst RN, BSN Entered By: Levan Hurst on 06/03/2021 09:13:55 -------------------------------------------------------------------------------- Wound Assessment Details Patient Name: Date of Service: [place], [name] 06/03/2021 9:00 A M Medical Record Number: [id number] Patient Account  Number: [id number] Date of Birth/Sex: Treating RN: 01-21-1951 (70 y.o. Helene Shoe, Tammi Klippel Primary Care Aric Jost: [name] Other Clinician: Referring Konnor Vondrasek: Treating Faigy Stretch/Extender: Neysa Hotter in Treatment: 7 Wound Status Wound Number: 1 Primary Etiology: Trauma, Other Wound Location: Right, Anterior Lower Leg Wound Status: Open Wounding Event: Trauma Date Acquired: 03/17/2021 Weeks Of Treatment: 7 Clustered Wound: No Wound Measurements Length: (cm) 0.5 Width: (cm) 0.8 Depth: (cm) 0.2 Area: (cm) 0.314 Volume: (cm) 0.063 % Reduction in Area: 81% % Reduction in Volume: 61.8% Wound Description Classification: Full Thickness Without Exposed Support Structu Exudate Amount: Medium Exudate Type: Serosanguineous Exudate Color: red, brown res Electronic Signature(s) Signed: 06/03/2021 6:11:30 PM By: Deon Pilling Signed: 06/09/2021  9:12:52 AM By: Sandre Kitty Entered By: Sandre Kitty on 06/03/2021 08:42:40 -------------------------------------------------------------------------------- Wound Assessment Details Patient Name: Date of Service: [place], [name] 06/03/2021 9:00 A M Medical Record Number: [id number] Patient Account Number: [id number] Date of Birth/Sex: Treating RN: Jan 10, 1951 (70 y.o. Helene Shoe, Tammi Klippel Primary Care Wladyslaw Henrichs: [name] Other Clinician: Referring Tukker Byrns: Treating Maureen Delatte/Extender: Neysa Hotter in Treatment: 7 Wound Status Wound Number: 2 Primary Etiology: Trauma, Other Wound Location: Right, Lateral Lower Leg Wound Status: Open Wounding Event: Trauma Date Acquired: 05/15/2021 Weeks Of Treatment: 2 Clustered Wound: No Wound Measurements Length: (cm) 0.2 Width: (cm) 0.2 Depth: (cm) 0.1 Area: (cm) 0.031 Volume: (cm) 0.003 % Reduction in Area: 89% % Reduction in Volume: 89.3% Wound Description Classification: Full Thickness Without Exposed Support Structu Exudate Amount: Medium Exudate Type: Sanguinous Exudate Color: red res Electronic Signature(s) Signed: 06/03/2021 6:11:30 PM By: Deon Pilling Signed: 06/09/2021 9:12:52 AM By: Sandre Kitty Entered By: Sandre Kitty on 06/03/2021 08:42:40 -------------------------------------------------------------------------------- Vitals Details Patient Name: Date of Service: [place], [name] 06/03/2021 9:00 A M Medical Record Number: [id number] Patient Account Number: [id number] Date of Birth/Sex: Treating RN: [birth year] (70 y.o. Helene Shoe, Tammi Klippel Primary Care Martita Brumm: [name] Other Clinician: Referring Mardi Cannady: Treating [place]/Extender: Neysa Hotter in Treatment: 7 Vital Signs Time Taken: 08:42 Temperature (F): 98.1 Height (in): 68 Pulse (bpm): 82 Weight (lbs): 157 Respiratory Rate (breaths/min): 16 Body Mass Index (BMI): 23.9 Blood  Pressure (mmHg): 120/74 Reference Range: 80 - 120 mg / dl Electronic Signature(s) Signed: 06/09/2021 9:12:52 AM By: Sandre Kitty Entered By: Sandre Kitty on 06/03/2021 08:42:26

## 2021-06-10 ENCOUNTER — Inpatient Hospital Stay (HOSPITAL_BASED_OUTPATIENT_CLINIC_OR_DEPARTMENT_OTHER): Payer: Medicare Other | Admitting: Internal Medicine

## 2021-06-10 VITALS — BP 116/69 | HR 92 | Temp 98.4°F | Resp 18 | Wt 142.3 lb

## 2021-06-10 DIAGNOSIS — C7931 Secondary malignant neoplasm of brain: Secondary | ICD-10-CM

## 2021-06-10 DIAGNOSIS — Z5112 Encounter for antineoplastic immunotherapy: Secondary | ICD-10-CM | POA: Diagnosis not present

## 2021-06-10 NOTE — Progress Notes (Signed)
[facility] [place], [place] [address] [phone]   Interval Evaluation  Date of Service: 06/10/21 Patient Name: [name] Riley Patient MRN: [id number] Patient DOB: Dec 27, 1950 Provider: Ventura Sellers, MD  Identifying Statement:  [name] Riley is a 70 y.o. female with seizures, brain metastases  Primary Cancer:  Oncologic History: Oncology History  Breast cancer of upper-outer quadrant of left female breast ([place])  11/14/2011 Surgery   Bilateral mastectomy, prophylactic on the right, left breast IDC 3/18 lymph nodes positive with extracapsular extension ER 89%, PR 81%, HER-2 negative, Ki-67 79% T2 N1 A. stage IIB   12/13/2011 - 06/28/2012 Chemotherapy   4 cycles of FEC followed by 4 cycles of Taxotere   07/17/2012 - 08/22/2012 Radiation Therapy   Adjuvant radiation therapy   08/22/2012 - 03/16/2016 Anti-estrogen oral therapy   Arimidex 1 mg daily   03/16/2016 Relapse/Recurrence   Subcutaneous nodule excision left chest: Infiltrating carcinoma breast primary, ER positive, PR negative   03/29/2016 Imaging   CT CAP and bone scan: Lytic lesions T8 vertebral, T1 posterior element, subcutaneous nodule left lateral chest wall, nonspecific lung nodules; Bone scan: Mets to kull, left humerus, left eighth rib, T7/T8, sternum, left acetabulum   04/28/2016 - 06/17/2017 Chemotherapy   Herceptin, lapatinib, Faslodex, Zometa every 4 weeks, lapatinib discontinued in September 2018 due to elevation of LFTs   06/22/2017 Relapse/Recurrence   Surgical excision:Soft tissue mass left lateral chest wall primary breast cancer, soft tissue mass left medial chest wall breast cancer, tumor is within the dermis extending to the subcutaneous adipose tissue and involves portions of skeletal muscle   06/22/2017 Cancer Staging   Staging form: Breast, AJCC 7th Edition - Pathologic stage from 06/22/2017: Stage IV (TX, NX, M1) - Signed by Nicholas Lose, MD on  12/27/2019   08/2017 - 02/16/2018 Chemotherapy   Faslodex with Herceptin and Perjeta along with Zometa every 4 weeks   03/02/2018 - 05/25/2018 Chemotherapy   Kadcyla   05/11/2018 Imaging   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 Associated vasogenic edema within the underlying right cerebral hemisphere without significant midline shift. Signal abnormality throughout the visualized bone marrow, compatible with osseous metastatic disease.    06/04/2018 Imaging   CT CAP: Right lower lobe lung nodule 7 mm (was 5 mm); multiple bone metastases throughout the spine and ribs sternum scapula and humerus, slightly increased lower thoracic mets, right renal lesion 2.5 cm (was 1.2 cm) right femur met increased from 2.1 cm to 2.9 cm   06/15/2018 -  Anti-estrogen oral therapy   Abemaciclib, Herceptin, letrozole, Xgeva   06/07/2019 Imaging   Progression of brain metastases,2 discrete dural-based lesions involving the posterior right frontal lobe. The more anterior lesion now measures 16.5 x 17.5 x 14 mm. The posterior lesion now measures 9.5 x 13 x 20 mm.  Vasogenic edema of the frontal and parietal lobes   06/21/2019 - 06/27/2019 Radiation Therapy   SRS to the brain   07/19/2020 Relapse/Recurrence   Left mastectomy scar nodule: Biopsy invasive carcinoma ER 60%, PR 0%, HER-2 negative by [facility]   07/29/2020 Surgery   Soft tissue mass left chest wall excision: No evidence of malignancy.   11/27/2020 - 02/05/2021 Chemotherapy      Patient is on Antibody Plan: BREAST ADO-TRASTUZUMAB EMTANSINE ([place]) Q21D     01/15/2021 -  Chemotherapy      Patient is on Antibody Plan: BREAST ADO-TRASTUZUMAB EMTANSINE (KADCYLA) Q21D  Metastatic breast cancer ([place])  06/29/2017 Initial Diagnosis   Metastatic breast cancer ([place])   06/15/2018 - 12/25/2020 Chemotherapy    Patient is on Treatment Plan: BRAIN GBM BEVACIZUMAB 14D X 6 CYCLES       01/15/2021 -  Chemotherapy      Patient is on  Antibody Plan: BREAST ADO-TRASTUZUMAB EMTANSINE (KADCYLA) Q21D     Brain metastasis ([place])  07/13/2018 Initial Diagnosis   Brain metastasis ([place])   11/27/2020 - 02/05/2021 Chemotherapy      Patient is on Antibody Plan: BREAST ADO-TRASTUZUMAB EMTANSINE (KADCYLA) Q21D       Interval History:  [name] Riley presents today after recent MRI brain.  She did experience a fall, as well as an episode of "flushed feeling, lightheaded, hands shaking" during her mother's funeral this past month.  Right leg wound is healing well compared to prior. Otherwise no additional headaches or typical seizures, continues on Kadcyla and Abemaciclib with Dr. Lindi Adie.  Dexamethasone  09/03/20: 69m 09/15/20: 152m01/12/22: 1877m1/14/22: 4mg52medications: Current Outpatient Medications on File Prior to Visit  Medication Sig Dispense Refill   abemaciclib (VERZENIO) 100 MG tablet TAKE 1 TABLET BY MOUTH 2 TIMES DAILY. SWALLOW TABLETS WHOLE. DO NOT CHEW, CRUSH, OR SPLIT TABLETS BEFORE SWALLOWING. 56 tablet 3   acetaminophen (TYLENOL) 500 MG tablet Take 1,000 mg by mouth every 6 (six) hours as needed for moderate pain.     Ascorbic Acid (VITAMIN C PO) Take by mouth.     cholecalciferol (VITAMIN D) 1000 units tablet Take 1 tablet (1,000 Units total) by mouth daily.     Docusate Sodium (DSS) 100 MG CAPS Take by mouth.     furosemide (LASIX) 20 MG tablet TAKE ONE (1) TABLET BY MOUTH EVERY DAY 30 tablet 1   letrozole (FEMARA) 2.5 MG tablet TAKE ONE (1) TABLET EACH DAY 90 tablet 3   levETIRAcetam (KEPPRA) 500 MG tablet TAKE ONE TABLET BY MOUTH TWICE DAILY 60 tablet 3   levothyroxine (SYNTHROID) 100 MCG tablet TAKE ONE TABLET EACH MORNING BEFORE BREAKFAST 90 tablet 2   lidocaine-prilocaine (EMLA) cream APPLY TOPICALLY AS NEEDED FOR PORT ACCESS 30 g 3   metoprolol succinate (TOPROL-XL) 100 MG 24 hr tablet TAKE ONE (1) TABLET EACH DAY 90 tablet 0   Multiple Vitamins-Minerals (MULTIVITAMIN WITH MINERALS) tablet Take 1  tablet by mouth daily.     ondansetron (ZOFRAN-ODT) 4 MG disintegrating tablet TAKE 1 TABLET EVERY 6 HOURS AS NEEDED FOR NAUSEA AND VOMITING 30 tablet 3   rosuvastatin (CRESTOR) 5 MG tablet Take 1 tablet (5 mg total) by mouth daily. (new directions) 90 tablet 1   sulfamethoxazole-trimethoprim (BACTRIM DS) 800-160 MG tablet Take 1 tablet by mouth 2 (two) times daily. 10 tablet 0   triamcinolone (KENALOG) 0.025 % ointment Apply 1 application topically 2 (two) times daily. 80 g 1   valsartan (DIOVAN) 80 MG tablet TAKE ONE (1) TABLET EACH DAY 90 tablet 1   levofloxacin (LEVAQUIN) 500 MG tablet Take 1 tablet (500 mg total) by mouth daily for 7 days. (Patient not taking: Reported on 06/10/2021) 7 tablet 0   potassium chloride SA (KLOR-CON) 20 MEQ tablet Take 1 tablet (20 mEq total) by mouth 2 (two) times daily for 7 days. 14 tablet 0   predniSONE (DELTASONE) 50 MG tablet Patient to take 13 hours, 7 hours, and 1 hour prior to CT scan (Patient not taking: Reported on 06/10/2021) 3 tablet 0   Current Facility-Administered Medications on File Prior to Visit  Medication Dose Route Frequency Provider Last Rate Last Admin   sodium chloride flush (NS) 0.9 % injection 10 mL  10 mL Intracatheter PRN Nicholas Lose, MD   10 mL at 05/03/21 1653    Allergies:  Allergies  Allergen Reactions   Cantaloupe Extract Allergy Skin Test Shortness Of Breath   Contrast Media [Iodinated Diagnostic Agents] Shortness Of Breath and Rash   Pravastatin Other (See Comments)    Legs hurt   Zosyn [Piperacillin Sod-Tazobactam So] Rash and Other (See Comments)    Temperature increase, facial flushing   Latex Rash    Redness, itch    Past Medical History:  Past Medical History:  Diagnosis Date   Allergy    Breast cancer ([place]) 08/25/2011   L , invasive ductal carcinoma, ER/PR +,HER2 -   Cancer (H[facility])    left breast cancer   Coronary artery disease 2001   Heart attack [facility]) 09/2000   Sep 25, 2000  --no intervention   History  of chemotherapy comp. 08/22/2012   4 cycles of FEC and $ cycles of Taxotere   Hyperlipidemia    Hypertension    Hypothyroidism    PONV (postoperative nausea and vomiting)    gets sick from anesthesia   Status post radiation therapy 07/09/12 - 08/22/2012   Left Breast, 60.4 gray   Past Surgical History:  Past Surgical History:  Procedure Laterality Date   ABDOMINAL HYSTERECTOMY  1998   TAH, oophorectomy   APPENDECTOMY  1970   BREAST CYST EXCISION Left 07/29/2020   Procedure: WIDE EXCISION OF LEFT MASTECTOMY INCISION;  Surgeon: Donnie [place], MD;  Location: [facility];  Service: General;  Laterality: Left;   [place]   removal of benign lump in rt breast   BREAST SURGERY  11/14/11   right simple mastectomy, left mrm   INCISION AND DRAINAGE OF WOUND Left 07/01/2017   Procedure: IRRIGATION AND DEBRIDEMENT CHEST WALL ABSCESS;  Surgeon: Donnie [facility]a, MD;  Location: [facility];  Service: General;  Laterality: Left;   MASS EXCISION Left 06/22/2017   Procedure: EXCISION OF CHEST WALL MASSES;  Surgeon: Donnie [place]a, MD;  Location: [facility];  Service: General;  Laterality: Left;   MASTECTOMY Bilateral    for left breast cancer   OVARIAN CYST SURGERY Right [place]  08/30/2012   Procedure: REMOVAL PORT-A-CATH;  Surgeon: Imogene Burn. [name], MD;  Location: [place];  Service: General;  Laterality: Right;  port removal   PORTACATH PLACEMENT  11/14/2011   Procedure: INSERTION PORT-A-CATH;  Surgeon: Imogene Burn. [name], MD;  Location: [facility];  Service: General;  Laterality: Right;   PORTACATH PLACEMENT N/A 04/25/2016   Procedure: INSERTION PORT-A-CATH LEFT CHEST;  Surgeon: Donnie Mesa, MD;  Location: [facility];  Service: General;  Laterality: N/A;   skin tags  05/09/1997   left axillary left neck skin tags   TONSILLECTOMY  1968   Social History:  Social History   Socioeconomic History   Marital status: Married    Spouse name:  Not on file   Number of children: 3   Years of education: Not on file   Highest education level: High school graduate  Occupational History   Occupation: Retired  Tobacco Use   Smoking status: Never   Smokeless tobacco: Never  Vaping Use   Vaping Use: Never used  Substance and Sexual Activity   Alcohol use: No   Drug use: No   Sexual activity: Yes  Birth control/protection: Surgical    Comment: menarche 74, Parity age 13, G73, P67, 1 miscarriage,  HRT x 5-10 yrs, Mild Hot Flashes  Other Topics Concern   Not on file  Social History Narrative   Lives at home.   Social Determinants of Health   Financial Resource Strain: Not on file  Food Insecurity: Not on file  Transportation Needs: Not on file  Physical Activity: Not on file  Stress: Not on file  Social Connections: Not on file  Intimate Partner Violence: Not on file   Family History:  Family History  Problem Relation Age of Onset   Hypertension Maternal Grandmother    Diabetes Maternal Grandmother    Cancer Father 68       lung cancer and Prostate Cancer   Hypertension Mother    Cancer Paternal Aunt        ovarian   Cancer Cousin        breast, paternal cousin   Cancer Paternal Uncle        stomach   Cancer Paternal Grandfather        Esophagus   Colon cancer Neg Hx     Review of Systems: Constitutional: Denies fevers, chills or abnormal weight loss Eyes: Denies blurriness of vision Ears, nose, mouth, throat, and face: Denies mucositis or sore throat Respiratory: Denies cough, dyspnea or wheezes Cardiovascular: Denies palpitation, chest discomfort or lower extremity swelling Gastrointestinal:  Denies nausea, constipation, diarrhea GU: Denies dysuria or incontinence Skin: Denies abnormal skin rashes Neurological: Per HPI Musculoskeletal: Denies joint pain, back or neck discomfort. No decrease in ROM Behavioral/Psych: Denies anxiety, disturbance in thought content, and mood instability   Physical  Exam: Vitals:   06/10/21 1035  BP: 116/69  Pulse: 92  Resp: 18  Temp: 98.4 F (36.9 C)  SpO2: 95%   KPS: 80. General: Alert, cooperative, pleasant, in no acute distress Head: Normal EENT: No conjunctival injection or scleral icterus. Oral mucosa moist Lungs: Resp effort normal Cardiac: Regular rate and rhythm Abdomen: Soft, non-distended abdomen Skin: No rashes cyanosis or petechiae. Extremities: R lower leg wound, wrapped  Neurologic Exam: Mental Status: Awake, alert, attentive to examiner. Oriented to self and environment. Language is fluent with intact comprehension.  Cranial Nerves: Visual acuity is grossly normal. Visual fields are full. Extra-ocular movements intact. No ptosis. Face is symmetric, tongue midline. Motor: Tone and bulk are normal. Arms and legs 5/5. Reflexes are symmetric, no pathologic reflexes present. Intact finger to nose bilaterally Sensory: Normal Gait: Normal and tandem gait is deferred   Labs: I have reviewed the data as listed    Component Value Date/Time   NA 138 05/21/2021 1012   NA 138 02/09/2021 1551   NA 140 09/22/2017 0958   K 3.2 (L) 05/21/2021 1012   K 3.7 09/22/2017 0958   CL 101 05/21/2021 1012   CL 105 08/10/2012 0908   CO2 26 05/21/2021 1012   CO2 26 09/22/2017 0958   GLUCOSE 102 (H) 05/21/2021 1012   GLUCOSE 80 09/22/2017 0958   GLUCOSE 81 08/10/2012 0908   BUN 12 05/21/2021 1012   BUN 24 02/09/2021 1551   BUN 12.8 09/22/2017 0958   CREATININE 0.82 05/21/2021 1012   CREATININE 0.6 09/22/2017 0958   CALCIUM 9.4 05/21/2021 1012   CALCIUM 9.1 09/22/2017 0958   PROT 6.6 05/21/2021 1012   PROT 5.8 (L) 02/09/2021 1551   PROT 7.0 09/22/2017 0958   ALBUMIN 3.3 (L) 05/21/2021 1012   ALBUMIN 3.7 (L) 02/09/2021 1551  ALBUMIN 4.0 09/22/2017 0958   AST 67 (H) 05/21/2021 1012   AST 37 (H) 09/22/2017 0958   ALT 26 05/21/2021 1012   ALT 31 09/22/2017 0958   ALKPHOS 266 (H) 05/21/2021 1012   ALKPHOS 140 09/22/2017 0958   BILITOT  1.0 05/21/2021 1012   BILITOT 0.41 09/22/2017 0958   GFRNONAA >60 05/21/2021 1012   GFRAA 76 09/02/2020 0806   GFRAA >60 07/10/2020 0820   Lab Results  Component Value Date   WBC 2.9 (L) 05/21/2021   NEUTROABS 1.0 (L) 05/21/2021   HGB 10.8 (L) 05/21/2021   HCT 31.1 (L) 05/21/2021   MCV 106.9 (H) 05/21/2021   PLT 117 (L) 05/21/2021     Imaging:  [place] Clinician Interpretation: I have personally reviewed the CNS images as listed.  My interpretation, in the context of the patient's clinical presentation, is stable disease  MR BRAIN W WO CONTRAST  Result Date: 06/04/2021 CLINICAL DATA:  Brain metastasis, assess treatment response EXAM: MRI HEAD WITHOUT AND WITH CONTRAST TECHNIQUE: Multiplanar, multiecho pulse sequences of the brain and surrounding structures were obtained without and with intravenous contrast. CONTRAST:  6.57m GADAVIST GADOBUTROL 1 MMOL/ML IV SOLN COMPARISON:  Brain MRI 02/16/2021 FINDINGS: Brain: Ill-defined parenchymal enhancement involving the posterior portion of the right frontal lobe and right parietal lobe are again seen. The more anterior region measures up to 1.7 cm AP by 1.5 cm TV by 1.2 cm cc (previously measured 1.5 cm by 1.4 cm by 1.1 cm using similar technique). The degree of enhancement appears slightly increased on the axial T1 black blood sequence. More posteriorly in the parietal lobe common there is a smaller region of enhancement measuring approximately 0.8 cm AP by 1.2 cm TV thigh 0.7 cm cc, previously measured 0.8 cm x 1.1 cm by 0.8 cm when measured using similar technique. This lesion appears overall similar compared to the prior study. There are patchy areas of intrinsic T1 hyperintensity within both of these regions of enhancement. There is patchy increased DWI signal in the parenchyma surrounding the enhancement with no decreased ADC signal. Inferolateral to this region overlying the right frontal lobe, again seen is an area of irregular dural thickening  measuring approximately 3-4 mm in thickness, unchanged). Diffuse calvarial signal abnormality overlying much of the right cerebral hemisphere and portions of the left cerebral hemisphere at the vertex is similar to the prior study. FLAIR signal abnormality in the surrounding frontal and parietal lobes is not significantly changed. A small focus of SWI signal dropout within the more anterior region of enhancement is unchanged. The 5 mm dural-based enhancement overlying the right anterior temporal lobe is unchanged ([id number]). There is no new enhancement. There is no evidence of acute intracranial hemorrhage, extra-axial fluid collection, or infarct. Vascular: Normal flow voids. Skull and upper cervical spine: As above, there is extensive metastatic disease throughout the calvarium extending to the right sphenoid wing and lateral wall of the orbit. With no appreciable interval change. Bony metastases are also seen in the cervical spine and right hemi mandible, unchanged. Sinuses/Orbits: There is mild mucosal thickening in the maxillary sinuses. The globes and orbits are unremarkable. Other: None. IMPRESSION: 1. Overall stable study. 2. No significant interval change in size of the ill-defined areas of enhancement in the right frontal and parietal lobes, unchanged dural thickening overlying the right cerebral hemisphere, and unchanged dural based nodular enhancement overlying the right anterior temporal lobe. 3. Overall unchanged extensive osseous metastatic disease. Electronically Signed   By: PValetta Mole  M.D.   On: 06/04/2021 09:21       Assessment/Plan 1. Brain metastasis [facility])  [name] Riley is clinically and radiographically stable today.    Decadron has been discontinued.    Episode at funeral could have been seizure, but is considered provoked given high stress, lack of sleep during period surrounding her mother's passing.  Will recommend continuing Keppra 566m BID.  We appreciate the opportunity  to participate in the care of [name] Riley.    We ask that [name] RIEFreturn to clinic in 4 months following next brain MRI, or sooner as needed.    All questions were answered. The patient knows to call the clinic with any problems, questions or concerns. No barriers to learning were detected.  I have spent a total of 30 minutes of face-to-face and non-face-to-face time, excluding clinical staff time, preparing to see patient, ordering tests and/or medications, counseling the patient, and independently interpreting results and communicating results to the patient/family/caregiver    ZVentura Sellers MD Medical Director of Neuro-Oncology [facility] [place]08/25/22 10:59 AM

## 2021-06-10 NOTE — Progress Notes (Signed)
Patient Care Team: [name], MD as PCP - General (Family Medicine) Minus Breeding, MD as PCP - Cardiology (Cardiology) Nicholas Lose, MD as Consulting Physician (Hematology and Oncology)  DIAGNOSIS:    ICD-10-CM   1. Malignant neoplasm of upper-outer quadrant of left breast in female, estrogen receptor positive ([place])  C50.412    Z17.0       SUMMARY OF ONCOLOGIC HISTORY: Oncology History  Breast cancer of upper-outer quadrant of left female breast ([place])  11/14/2011 Surgery   Bilateral mastectomy, prophylactic on the right, left breast IDC 3/18 lymph nodes positive with extracapsular extension ER 89%, PR 81%, HER-2 negative, Ki-67 79% T2 N1 A. stage IIB   12/13/2011 - 06/28/2012 Chemotherapy   4 cycles of FEC followed by 4 cycles of Taxotere   07/17/2012 - 08/22/2012 Radiation Therapy   Adjuvant radiation therapy   08/22/2012 - 03/16/2016 Anti-estrogen oral therapy   Arimidex 1 mg daily   03/16/2016 Relapse/Recurrence   Subcutaneous nodule excision left chest: Infiltrating carcinoma breast primary, ER positive, PR negative   03/29/2016 Imaging   CT CAP and bone scan: Lytic lesions T8 vertebral, T1 posterior element, subcutaneous nodule left lateral chest wall, nonspecific lung nodules; Bone scan: Mets to kull, left humerus, left eighth rib, T7/T8, sternum, left acetabulum   04/28/2016 - 06/17/2017 Chemotherapy   Herceptin, lapatinib, Faslodex, Zometa every 4 weeks, lapatinib discontinued in September 2018 due to elevation of LFTs   06/22/2017 Relapse/Recurrence   Surgical excision:Soft tissue mass left lateral chest wall primary breast cancer, soft tissue mass left medial chest wall breast cancer, tumor is within the dermis extending to the subcutaneous adipose tissue and involves portions of skeletal muscle   06/22/2017 Cancer Staging   Staging form: Breast, AJCC 7th Edition - Pathologic stage from 06/22/2017: Stage IV (TX, NX, M1) - Signed by Nicholas Lose, MD on 12/27/2019    08/2017 - 02/16/2018 Chemotherapy   Faslodex with Herceptin and Perjeta along with Zometa every 4 weeks   03/02/2018 - 05/25/2018 Chemotherapy   Kadcyla   05/11/2018 Imaging   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 Associated vasogenic edema within the underlying right cerebral hemisphere without significant midline shift. Signal abnormality throughout the visualized bone marrow, compatible with osseous metastatic disease.    06/04/2018 Imaging   CT CAP: Right lower lobe lung nodule 7 mm (was 5 mm); multiple bone metastases throughout the spine and ribs sternum scapula and humerus, slightly increased lower thoracic mets, right renal lesion 2.5 cm (was 1.2 cm) right femur met increased from 2.1 cm to 2.9 cm   06/15/2018 -  Anti-estrogen oral therapy   Abemaciclib, Herceptin, letrozole, Xgeva   06/07/2019 Imaging   Progression of brain metastases,2 discrete dural-based lesions involving the posterior right frontal lobe. The more anterior lesion now measures 16.5 x 17.5 x 14 mm. The posterior lesion now measures 9.5 x 13 x 20 mm.  Vasogenic edema of the frontal and parietal lobes   06/21/2019 - 06/27/2019 Radiation Therapy   SRS to the brain   07/19/2020 Relapse/Recurrence   Left mastectomy scar nodule: Biopsy invasive carcinoma ER 60%, PR 0%, HER-2 negative by [facility]   07/29/2020 Surgery   Soft tissue mass left chest wall excision: No evidence of malignancy.   11/27/2020 - 02/05/2021 Chemotherapy      Patient is on Antibody Plan: BREAST ADO-TRASTUZUMAB EMTANSINE ([place]) Q21D     01/15/2021 -  Chemotherapy      Patient is on Antibody Plan: BREAST ADO-TRASTUZUMAB EMTANSINE (KADCYLA) Q21D  Metastatic breast cancer ([place])  06/29/2017 Initial Diagnosis   Metastatic breast cancer ([place])   06/15/2018 - 12/25/2020 Chemotherapy    Patient is on Treatment Plan: BRAIN GBM BEVACIZUMAB 14D X 6 CYCLES       01/15/2021 -  Chemotherapy      Patient is on Antibody Plan:  BREAST ADO-TRASTUZUMAB EMTANSINE ([place]) Q21D     Brain metastasis ([place])  07/13/2018 Initial Diagnosis   Brain metastasis ([place])   11/27/2020 - 02/05/2021 Chemotherapy      Patient is on Antibody Plan: BREAST ADO-TRASTUZUMAB EMTANSINE (KADCYLA) Q21D       CHIEF COMPLIANT: Cycle 8 Kadcyla  INTERVAL HISTORY: [name] Riley is a 70 y.o. with above-mentioned history of metastatic breast cancer currently on abemaciclib with letrozole along with Herceptin, Avastin, and Xgeva. She presents to the clinic today for cycle 8 of Kadcyla.  Patient's mother passed away recently and therefore her nutrition has suffered significantly.  She was also feeling dizzy and she fell at the funeral and scraped her leg and she is getting wound care to help her with this.  ALLERGIES:  is allergic to cantaloupe extract allergy skin test, contrast media [iodinated diagnostic agents], pravastatin, zosyn [piperacillin sod-tazobactam so], and latex.  MEDICATIONS:  Current Outpatient Medications  Medication Sig Dispense Refill   abemaciclib (VERZENIO) 100 MG tablet TAKE 1 TABLET BY MOUTH 2 TIMES DAILY. SWALLOW TABLETS WHOLE. DO NOT CHEW, CRUSH, OR SPLIT TABLETS BEFORE SWALLOWING. 56 tablet 3   acetaminophen (TYLENOL) 500 MG tablet Take 1,000 mg by mouth every 6 (six) hours as needed for moderate pain.     Ascorbic Acid (VITAMIN C PO) Take by mouth.     cholecalciferol (VITAMIN D) 1000 units tablet Take 1 tablet (1,000 Units total) by mouth daily.     Docusate Sodium (DSS) 100 MG CAPS Take by mouth.     furosemide (LASIX) 20 MG tablet TAKE ONE (1) TABLET BY MOUTH EVERY DAY 30 tablet 1   letrozole (FEMARA) 2.5 MG tablet TAKE ONE (1) TABLET EACH DAY 90 tablet 3   levETIRAcetam (KEPPRA) 500 MG tablet TAKE ONE TABLET BY MOUTH TWICE DAILY 60 tablet 3   levofloxacin (LEVAQUIN) 500 MG tablet Take 1 tablet (500 mg total) by mouth daily for 7 days. (Patient not taking: Reported on 06/10/2021) 7 tablet 0   levothyroxine  (SYNTHROID) 100 MCG tablet TAKE ONE TABLET EACH MORNING BEFORE BREAKFAST 90 tablet 2   lidocaine-prilocaine (EMLA) cream APPLY TOPICALLY AS NEEDED FOR PORT ACCESS 30 g 3   metoprolol succinate (TOPROL-XL) 100 MG 24 hr tablet TAKE ONE (1) TABLET EACH DAY 90 tablet 0   Multiple Vitamins-Minerals (MULTIVITAMIN WITH MINERALS) tablet Take 1 tablet by mouth daily.     ondansetron (ZOFRAN-ODT) 4 MG disintegrating tablet TAKE 1 TABLET EVERY 6 HOURS AS NEEDED FOR NAUSEA AND VOMITING 30 tablet 3   potassium chloride SA (KLOR-CON) 20 MEQ tablet Take 1 tablet (20 mEq total) by mouth 2 (two) times daily for 7 days. 14 tablet 0   predniSONE (DELTASONE) 50 MG tablet Patient to take 13 hours, 7 hours, and 1 hour prior to CT scan (Patient not taking: Reported on 06/10/2021) 3 tablet 0   rosuvastatin (CRESTOR) 5 MG tablet Take 1 tablet (5 mg total) by mouth daily. (new directions) 90 tablet 1   sulfamethoxazole-trimethoprim (BACTRIM DS) 800-160 MG tablet Take 1 tablet by mouth 2 (two) times daily. 10 tablet 0   triamcinolone (KENALOG) 0.025 % ointment Apply 1 application topically 2 (two)  times daily. 80 g 1   valsartan (DIOVAN) 80 MG tablet TAKE ONE (1) TABLET EACH DAY 90 tablet 1   No current facility-administered medications for this visit.   Facility-Administered Medications Ordered in Other Visits  Medication Dose Route Frequency Provider Last Rate Last Admin   sodium chloride flush (NS) 0.9 % injection 10 mL  10 mL Intracatheter PRN Nicholas Lose, MD   10 mL at 05/03/21 1653    PHYSICAL EXAMINATION: ECOG PERFORMANCE STATUS: 1 - Symptomatic but completely ambulatory  Vitals:   06/11/21 0950  BP: 127/68  Pulse: 88  Resp: 16  Temp: (!) 97.4 F (36.3 C)  SpO2: 97%   Filed Weights   06/11/21 0950  Weight: 140 lb 14.4 oz (63.9 kg)    LABORATORY DATA:  I have reviewed the data as listed CMP Latest Ref Rng & Units 06/11/2021 05/21/2021 05/03/2021  Glucose 70 - 99 mg/dL 108(H) 102(H) 89  BUN 8 - 23  mg/dL 13 12 12   Creatinine 0.44 - 1.00 mg/dL 0.83 0.82 0.71  Sodium 135 - 145 mmol/L 138 138 140  Potassium 3.5 - 5.1 mmol/L 3.1(L) 3.2(L) 3.2(L)  Chloride 98 - 111 mmol/L 101 101 101  CO2 22 - 32 mmol/L 28 26 31   Calcium 8.9 - 10.3 mg/dL 9.4 9.4 9.5  Total Protein 6.5 - 8.1 g/dL 6.5 6.6 6.3(L)  Total Bilirubin 0.3 - 1.2 mg/dL 1.0 1.0 1.2  Alkaline Phos 38 - 126 U/L 236(H) 266(H) 181(H)  AST 15 - 41 U/L 53(H) 67(H) 49(H)  ALT 0 - 44 U/L 21 26 18     Lab Results  Component Value Date   WBC 3.3 (L) 06/11/2021   HGB 11.3 (L) 06/11/2021   HCT 33.0 (L) 06/11/2021   MCV 109.3 (H) 06/11/2021   PLT 86 (L) 06/11/2021   NEUTROABS 1.3 (L) 06/11/2021    ASSESSMENT & PLAN:  Breast cancer of upper-outer quadrant of left female breast ([place]) Current treatment:  Abemaciclib with letrozole along with Herceptin (to be given every 4 weeks) and Xgeva started 06/15/2018   CT CAP 05/25/2020: No significant interval change in the bone metastases. 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 Mickeal Skinner is adding Avastin to the treatment. Today is cycle 1   CTCAP 11/26/2020: New hepatic lesions worrisome for hepatic mets (1.6 cm, 1.2 cm, 2.3 cm)   Recommendation: 1. Biopsy of the liver lesion.  12/11/2020: Metastatic carcinoma, ER 75% week to moderate, PR 0%, Ki-67 25%, HER-2 equivocal by IHC, FISH positive ratio 2.97, copy #5.2 2. Change treatment plan: Kadcyla along with abemaciclib and letrozole ---------------------------------------------------------------------------------------------------------------------  Current Treatment: Kadcyla q [redacted] weeks along with Verzenio and letrozole. Today is cycle 8 (she completed bevacizumab x4)   Kadcyla toxicities:  1.  Fatigue as a result of chemotherapy: Stable 2. chemo induced anemia:  Monitoring closely, today's hemoglobin is 11.3 3. peripheral neuropathy 4.  Emergency room visit for leg discomfort: Hypokalemic sent home with potassium replacement therapy 5.  Leukopenia: Related to Verzenio, stable 6.  Thrombocytopenia: Platelets 86: We will decrease the dosage of Kadcyla today okay to treat.   Nonhealing wound on the right leg: Wound care is assisting her Easy bruising on the hands: Possibly due to thrombocytopenia   Abemaciclib toxicities: denies diarrhea, Complains of more fatigue   CT CAP 04/02/21: Unchanged  Hepatic and  osseous disease   Return to clinic every 3 weeks for Kadcyla.    No orders of the defined types were placed in this encounter.  The patient has a good understanding of the overall plan. she agrees with it. she will call with any problems that may develop before the next visit here.  Total time spent: 30 mins including face to face time and time spent for planning, charting and coordination of care  Rulon Eisenmenger, MD, MPH 06/11/2021  I, Thana Ates, am acting as scribe for Dr. Nicholas Lose.  I have reviewed the above documentation for accuracy and completeness, and I agree with the above.

## 2021-06-11 ENCOUNTER — Other Ambulatory Visit: Payer: Self-pay

## 2021-06-11 ENCOUNTER — Other Ambulatory Visit: Payer: Self-pay | Admitting: Radiation Therapy

## 2021-06-11 ENCOUNTER — Inpatient Hospital Stay: Payer: Medicare Other

## 2021-06-11 ENCOUNTER — Inpatient Hospital Stay (HOSPITAL_BASED_OUTPATIENT_CLINIC_OR_DEPARTMENT_OTHER): Payer: Medicare Other | Admitting: Hematology and Oncology

## 2021-06-11 VITALS — BP 116/57 | HR 88 | Temp 98.1°F | Resp 16

## 2021-06-11 DIAGNOSIS — C50412 Malignant neoplasm of upper-outer quadrant of left female breast: Secondary | ICD-10-CM

## 2021-06-11 DIAGNOSIS — Z5112 Encounter for antineoplastic immunotherapy: Secondary | ICD-10-CM | POA: Diagnosis not present

## 2021-06-11 DIAGNOSIS — Z95828 Presence of other vascular implants and grafts: Secondary | ICD-10-CM

## 2021-06-11 DIAGNOSIS — Z170 Estrogen receptor positive status [ER+]: Secondary | ICD-10-CM

## 2021-06-11 DIAGNOSIS — C50919 Malignant neoplasm of unspecified site of unspecified female breast: Secondary | ICD-10-CM

## 2021-06-11 DIAGNOSIS — C7951 Secondary malignant neoplasm of bone: Secondary | ICD-10-CM

## 2021-06-11 LAB — CMP (CANCER CENTER ONLY)
ALT: 21 U/L (ref 0–44)
AST: 53 U/L — ABNORMAL HIGH (ref 15–41)
Albumin: 3.4 g/dL — ABNORMAL LOW (ref 3.5–5.0)
Alkaline Phosphatase: 236 U/L — ABNORMAL HIGH (ref 38–126)
Anion gap: 9 (ref 5–15)
BUN: 13 mg/dL (ref 8–23)
CO2: 28 mmol/L (ref 22–32)
Calcium: 9.4 mg/dL (ref 8.9–10.3)
Chloride: 101 mmol/L (ref 98–111)
Creatinine: 0.83 mg/dL (ref 0.44–1.00)
GFR, Estimated: >60 mL/min (ref >60)
Glucose, Bld: 108 mg/dL — ABNORMAL HIGH (ref 70–99)
Potassium: 3.1 mmol/L — ABNORMAL LOW (ref 3.5–5.1)
Sodium: 138 mmol/L (ref 135–145)
Total Bilirubin: 1 mg/dL (ref 0.3–1.2)
Total Protein: 6.5 g/dL (ref 6.5–8.1)

## 2021-06-11 LAB — CBC WITH DIFFERENTIAL (CANCER CENTER ONLY)
Abs Immature Granulocytes: 0.01 10*3/uL (ref 0.00–0.07)
Basophils Absolute: 0 10*3/uL (ref 0.0–0.1)
Basophils Relative: 1 %
Eosinophils Absolute: 0 10*3/uL (ref 0.0–0.5)
Eosinophils Relative: 1 %
HCT: 33 % — ABNORMAL LOW (ref 36.0–46.0)
Hemoglobin: 11.3 g/dL — ABNORMAL LOW (ref 12.0–15.0)
Immature Granulocytes: 0 %
Lymphocytes Relative: 48 %
Lymphs Abs: 1.6 10*3/uL (ref 0.7–4.0)
MCH: 37.4 pg — ABNORMAL HIGH (ref 26.0–34.0)
MCHC: 34.2 g/dL (ref 30.0–36.0)
MCV: 109.3 fL — ABNORMAL HIGH (ref 80.0–100.0)
Monocytes Absolute: 0.4 10*3/uL (ref 0.1–1.0)
Monocytes Relative: 11 %
Neutro Abs: 1.3 10*3/uL — ABNORMAL LOW (ref 1.7–7.7)
Neutrophils Relative %: 39 %
Platelet Count: 86 10*3/uL — ABNORMAL LOW (ref 150–400)
RBC: 3.02 MIL/uL — ABNORMAL LOW (ref 3.87–5.11)
RDW: 15.4 % (ref 11.5–15.5)
WBC Count: 3.3 10*3/uL — ABNORMAL LOW (ref 4.0–10.5)
nRBC: 0 % (ref 0.0–0.2)

## 2021-06-11 MED ORDER — SODIUM CHLORIDE 0.9% FLUSH
10.0000 mL | INTRAVENOUS | Status: DC | PRN
  Administered 2021-06-11: 10 mL

## 2021-06-11 MED ORDER — HEPARIN SOD (PORK) LOCK FLUSH 100 UNIT/ML IV SOLN
500.0000 [IU] | Freq: Once | INTRAVENOUS | Status: AC | PRN
  Administered 2021-06-11: 500 [IU]

## 2021-06-11 MED ORDER — SODIUM CHLORIDE 0.9% FLUSH
10.0000 mL | Freq: Once | INTRAVENOUS | Status: AC
  Administered 2021-06-11: 10 mL

## 2021-06-11 MED ORDER — DENOSUMAB 120 MG/1.7ML ~~LOC~~ SOLN
120.0000 mg | Freq: Once | SUBCUTANEOUS | Status: AC
  Administered 2021-06-11: 120 mg via SUBCUTANEOUS
  Filled 2021-06-11: qty 1.7

## 2021-06-11 MED ORDER — DIPHENHYDRAMINE HCL 25 MG PO CAPS
50.0000 mg | ORAL_CAPSULE | Freq: Once | ORAL | Status: AC
  Administered 2021-06-11: 50 mg via ORAL
  Filled 2021-06-11: qty 2

## 2021-06-11 MED ORDER — SODIUM CHLORIDE 0.9 % IV SOLN: Freq: Once | INTRAVENOUS | Status: AC

## 2021-06-11 MED ORDER — SODIUM CHLORIDE 0.9 % IV SOLN
2.6000 mg/kg | Freq: Once | INTRAVENOUS | Status: AC
  Administered 2021-06-11: 200 mg via INTRAVENOUS
  Filled 2021-06-11: qty 10

## 2021-06-11 MED ORDER — ACETAMINOPHEN 325 MG PO TABS
650.0000 mg | ORAL_TABLET | Freq: Once | ORAL | Status: AC
  Administered 2021-06-11: 650 mg via ORAL
  Filled 2021-06-11: qty 2

## 2021-06-11 NOTE — Patient Instructions (Addendum)
[facility]  Discharge Instructions: Thank you for choosing [facility] to provide your oncology and hematology care.   If you have a lab appointment with the [facility], please go directly to the [facility] and check in at the registration area.   Wear comfortable clothing and clothing appropriate for easy access to any Portacath or PICC line.   We strive to give you quality time with your provider. You may need to reschedule your appointment if you arrive late (15 or more minutes).  Arriving late affects you and other patients whose appointments are after yours.  Also, if you miss three or more appointments without notifying the office, you may be dismissed from the clinic at the provider's discretion.      For prescription refill requests, have your pharmacy contact our office and allow 72 hours for refills to be completed.    Today you received the following chemotherapy and/or immunotherapy agents: Kadcyla      To help prevent nausea and vomiting after your treatment, we encourage you to take your nausea medication as directed.  BELOW ARE SYMPTOMS THAT SHOULD BE REPORTED IMMEDIATELY: *FEVER GREATER THAN 100.4 F (38 C) OR HIGHER *CHILLS OR SWEATING *NAUSEA AND VOMITING THAT IS NOT CONTROLLED WITH YOUR NAUSEA MEDICATION *UNUSUAL SHORTNESS OF BREATH *UNUSUAL BRUISING OR BLEEDING *URINARY PROBLEMS (pain or burning when urinating, or frequent urination) *BOWEL PROBLEMS (unusual diarrhea, constipation, pain near the anus) TENDERNESS IN MOUTH AND THROAT WITH OR WITHOUT PRESENCE OF ULCERS (sore throat, sores in mouth, or a toothache) UNUSUAL RASH, SWELLING OR PAIN  UNUSUAL VAGINAL DISCHARGE OR ITCHING   Items with * indicate a potential emergency and should be followed up as soon as possible or go to the Emergency Department if any problems should occur.  Please show the CHEMOTHERAPY ALERT CARD or IMMUNOTHERAPY ALERT CARD at check-in to  the Emergency Department and triage nurse.  Should you have questions after your visit or need to cancel or reschedule your appointment, please contact [facility]  Dept: [phone]  and follow the prompts.  Office hours are 8:00 a.m. to 4:30 p.m. Monday - Friday. Please note that voicemails left after 4:00 p.m. may not be returned until the following business day.  We are closed weekends and major holidays. You have access to a nurse at all times for urgent questions. Please call the main number to the clinic Dept: [phone] and follow the prompts.   For any non-urgent questions, you may also contact your provider using MyChart. We now offer e-Visits for anyone 29 and older to request care online for non-urgent symptoms. For details visit [email or web address].   Also download the MyChart app! Go to the app store, search "MyChart", open the app, select [facility], and log in with your MyChart username and password.  Due to Covid, a mask is required upon entering the hospital/clinic. If you do not have a mask, one will be given to you upon arrival. For doctor visits, patients may have 1 support person aged 74 or older with them. For treatment visits, patients cannot have anyone with them due to current Covid guidelines and our immunocompromised population.   Denosumab injection What is this medication? DENOSUMAB (den oh sue mab) slows bone breakdown. Prolia is used to treat osteoporosis in women after menopause and in men, and in people who are taking corticosteroids for 6 months or more. Delton See is used to treat a high calcium level due  to cancer and to prevent bone fractures and other bone problems caused by multiple myeloma or cancer bone metastases. Xgeva is also used to treat giant cell tumor of the bone. This medicine may be used for other purposes; ask your health care provider or pharmacist if you have questions. COMMON BRAND NAME(S): Prolia, XGEVA What  should I tell my care team before I take this medication? They need to know if you have any of these conditions: dental disease having surgery or tooth extraction infection kidney disease low levels of calcium or Vitamin D in the blood malnutrition on hemodialysis skin conditions or sensitivity thyroid or parathyroid disease an unusual reaction to denosumab, other medicines, foods, dyes, or preservatives pregnant or trying to get pregnant breast-feeding How should I use this medication? This medicine is for injection under the skin. It is given by a health care professional in a hospital or clinic setting. A special MedGuide will be given to you before each treatment. Be sure to read this information carefully each time. For Prolia, talk to your pediatrician regarding the use of this medicine in children. Special care may be needed. For Xgeva, talk to your pediatrician regarding the use of this medicine in children. While this drug may be prescribed for children as young as 13 years for selected conditions, precautions do apply. Overdosage: If you think you have taken too much of this medicine contact a poison control center or emergency room at once. NOTE: This medicine is only for you. Do not share this medicine with others. What if I miss a dose? It is important not to miss your dose. Call your doctor or health care professional if you are unable to keep an appointment. What may interact with this medication? Do not take this medicine with any of the following medications: other medicines containing denosumab This medicine may also interact with the following medications: medicines that lower your chance of fighting infection steroid medicines like prednisone or cortisone This list may not describe all possible interactions. Give your health care provider a list of all the medicines, herbs, non-prescription drugs, or dietary supplements you use. Also tell them if you smoke, drink  alcohol, or use illegal drugs. Some items may interact with your medicine. What should I watch for while using this medication? Visit your doctor or health care professional for regular checks on your progress. Your doctor or health care professional may order blood tests and other tests to see how you are doing. Call your doctor or health care professional for advice if you get a fever, chills or sore throat, or other symptoms of a cold or flu. Do not treat yourself. This drug may decrease your body's ability to fight infection. Try to avoid being around people who are sick. You should make sure you get enough calcium and vitamin D while you are taking this medicine, unless your doctor tells you not to. Discuss the foods you eat and the vitamins you take with your health care professional. See your dentist regularly. Brush and floss your teeth as directed. Before you have any dental work done, tell your dentist you are receiving this medicine. Do not become pregnant while taking this medicine or for 5 months after stopping it. Talk with your doctor or health care professional about your birth control options while taking this medicine. Women should inform their doctor if they wish to become pregnant or think they might be pregnant. There is a potential for serious side effects to an unborn child.   effects may I notice from receiving this medication? Side effects that you should report to your doctor or health care professionalas soon as possible: allergic reactions like skin rash, itching or hives, swelling of the face, lips, or tongue bone pain breathing problems dizziness jaw pain, especially after dental work redness, blistering, peeling of the skin signs and symptoms of infection like fever or chills; cough; sore throat; pain or trouble passing urine signs of low calcium like fast heartbeat, muscle cramps or muscle pain; pain,  tingling, numbness in the hands or feet; seizures unusual bleeding or bruising unusually weak or tired Side effects that usually do not require medical attention (report to yourdoctor or health care professional if they continue or are bothersome): constipation diarrhea headache joint pain loss of appetite muscle pain runny nose tiredness upset stomach This list may not describe all possible side effects. Call your doctor for medical advice about side effects. You may report side effects to FDA at1-800-FDA-1088. Where should I keep my medication? This medicine is only given in a clinic, doctor's office, or other health caresetting and will not be stored at home. NOTE: This sheet is a summary. It may not cover all possible information. If you have questions about this medicine, talk to your doctor, pharmacist, orhealth care provider.  2022 Elsevier/Gold Standard (2018-02-09 16:10:44)

## 2021-06-11 NOTE — Assessment & Plan Note (Signed)
Current treatment: Abemaciclibwith letrozole along with Herceptin(to be given every 4 weeks)and Xgeva started 06/15/2018  CT CAP 05/25/2020: No significant interval change in the bone metastases. 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 Mickeal Skinner is adding Avastin to the treatment.Today is cycle 1  CTCAP2/07/2021: New hepatic lesions worrisome for hepatic mets (1.6 cm, 1.2 cm, 2.3 cm)  Recommendation: 1. Biopsy of the liver lesion.12/11/2020: Metastatic carcinoma, ER 75% week to moderate, PR 0%, Ki-67 25%, HER-2 equivocal by IHC, FISH positive ratio 2.97, copy #5.2 2. Change treatment plan:Kadcylaalong withabemaciclib and letrozole --------------------------------------------------------------------------------------------------------------------- Current Treatment:Kadcyla q [redacted] weeksalong with Verzenioand letrozole. Today is cycle8(she completed bevacizumab x4)  Kadcyla toxicities: 1.Fatigue as a result of chemotherapy: Stable 2.chemo induced anemia: Monitoring closely, today's hemoglobin is 11.3 3.peripheral neuropathy 4.Emergency room visit for leg discomfort: Hypokalemic sent home with potassium replacement therapy 5.Leukopenia: Related to Verzenio, stable  Nonhealing wound on the right XHF:SFSEL care is assisting her Easy bruising on the hands: Could be the effect of prior Avastin therapy  Abemaciclib toxicities:denies diarrhea, Complains of more fatigue  CT CAP 04/02/21: Unchanged Hepatic and osseous disease   Return to clinic every 3 weeks for Kadcyla.

## 2021-06-11 NOTE — Progress Notes (Signed)
OK to trt with ANC 1.3 per Dr.Gudena

## 2021-06-11 NOTE — Progress Notes (Signed)
Patient declined to stay for 30 minute post observation after Kadcyla infusion.  VSS at discharge.  Ambulatory to lobby.

## 2021-06-16 ENCOUNTER — Other Ambulatory Visit: Payer: Self-pay

## 2021-06-16 ENCOUNTER — Encounter (HOSPITAL_BASED_OUTPATIENT_CLINIC_OR_DEPARTMENT_OTHER): Payer: Medicare Other | Admitting: Physician Assistant

## 2021-06-16 DIAGNOSIS — L97812 Non-pressure chronic ulcer of other part of right lower leg with fat layer exposed: Secondary | ICD-10-CM | POA: Diagnosis not present

## 2021-06-16 NOTE — Progress Notes (Signed)
ALBENA, COMES ([id number]) Visit Report for 06/16/2021 Arrival Information Details Patient Name: Date of Service: LITISHA, GUAGLIARDO A. 06/16/2021 8:00 A M Medical Record Number: [id number] Patient Account Number: [id number] Date of Birth/Sex: Treating RN: [birth year] (70 y.o. [name] Primary Care Bell Cai: [name] Other Clinician: Referring Kensie Susman: Treating Slyvester Latona/Extender: Vickii Penna in Treatment: 9 Visit Information History Since Last Visit Added or deleted any medications: No Patient Arrived: Ambulatory Any new allergies or adverse reactions: No Arrival Time: 07:53 Had a fall or experienced change in No Transfer Assistance: None activities of daily living that may affect Patient Identification Verified: Yes risk of falls: Secondary Verification Process Completed: Yes Signs or symptoms of abuse/neglect since last visito No Patient Requires Transmission-Based Precautions: No Hospitalized since last visit: No Patient Has Alerts: No Implantable device outside of the clinic excluding No cellular tissue based products placed in the center since last visit: Has Dressing in Place as Prescribed: Yes Has Compression in Place as Prescribed: Yes Pain Present Now: No Electronic Signature(s) Signed: 06/16/2021 5:28:58 PM By: [name] Entered By: [name] on 06/16/2021 07:54:09 -------------------------------------------------------------------------------- Compression Therapy Details Patient Name: Date of Service: [place], [name] 06/16/2021 8:00 A M Medical Record Number: [id number] Patient Account Number: [id number] Date of Birth/Sex: Treating RN: 1951/05/17 (70 y.o. [name] Primary Care Kearsten Ginther: [name] Other Clinician: Referring Rakhi Romagnoli: Treating Nathasha Fiorillo/Extender: Vickii Penna in Treatment: 9 Compression Therapy Performed for Wound Assessment: Wound #1 Right,Anterior Lower  Leg Performed By: Clinician [name], RN Compression Type: Three Layer Post Procedure Diagnosis Same as Pre-procedure Electronic Signature(s) Signed: 06/16/2021 5:28:58 PM By: [name] Entered By: [name] on 06/16/2021 08:29:27 -------------------------------------------------------------------------------- Compression Therapy Details Patient Name: Date of Service: [place], [name] 06/16/2021 8:00 A M Medical Record Number: [id number] Patient Account Number: [id number] Date of Birth/Sex: Treating RN: 03-04-1951 (70 y.o. [name] Primary Care Nubia Ziesmer: [name] Other Clinician: Referring Mayukha Symmonds: Treating Jordayn Mink/Extender: Vickii Penna in Treatment: 9 Compression Therapy Performed for Wound Assessment: Wound #2 Right,Lateral Lower Leg Performed By: Clinician [name], RN Compression Type: Three Layer Post Procedure Diagnosis Same as Pre-procedure Electronic Signature(s) Signed: 06/16/2021 5:28:58 PM By: [name] Entered By: [name] on 06/16/2021 08:29:27 -------------------------------------------------------------------------------- Encounter Discharge Information Details Patient Name: Date of Service: [place], [name] 06/16/2021 8:00 A M Medical Record Number: [id number] Patient Account Number: [id number] Date of Birth/Sex: Treating RN: Oct 12, 1951 (70 y.o. [name] Primary Care Atiyah Bauer: [name] Other Clinician: Referring Viveca Beckstrom: Treating Qamar Rosman/Extender: Vickii Penna in Treatment: 9 Encounter Discharge Information Items Post Procedure Vitals Discharge Condition: Stable Temperature (F): 98.4 Ambulatory Status: Ambulatory Pulse (bpm): 105 Discharge Destination: Home Respiratory Rate (breaths/min): 16 Transportation: Private Auto Blood Pressure (mmHg): 133/67 Schedule Follow-up Appointment: Yes Clinical Summary of Care: Provided on  06/16/2021 Form Type Recipient Paper Patient Patient Electronic Signature(s) Signed: 06/16/2021 5:28:58 PM By: [name] Entered By: [name] on 06/16/2021 08:51:57 -------------------------------------------------------------------------------- Lower Extremity Assessment Details Patient Name: Date of Service: [place], [name] 06/16/2021 8:00 A M Medical Record Number: [id number] Patient Account Number: [id number] Date of Birth/Sex: Treating RN: March 20, 1951 (70 y.o. [name] Primary Care Tomica Arseneault: [name] Other Clinician: Referring Melah Ebling: Treating Eller Sweis/Extender: Vickii Penna in Treatment: 9 Edema Assessment Assessed: [Left: No] [Right: Yes] Edema: [Left: Ye] [Right: s] Calf Left: Right: Point of Measurement: 29 cm From  Medial Instep 33.5 cm Ankle Left: Right: Point of Measurement: 10 cm From Medial Instep 20 cm Vascular Assessment Pulses: Dorsalis Pedis Palpable: [Right:Yes] Electronic Signature(s) Signed: 06/16/2021 5:28:58 PM By: [name] Entered By: [name] on 06/16/2021 07:58:44 -------------------------------------------------------------------------------- Multi-Disciplinary Care Plan Details Patient Name: Date of Service: [place], [name] 06/16/2021 8:00 A M Medical Record Number: [id number] Patient Account Number: [id number] Date of Birth/Sex: Treating RN: [birth year] (70 y.o. [name] Primary Care Mirranda Monrroy: [name] Other Clinician: Referring Shewanda Sharpe: Treating Damon Hargrove/Extender: Vickii Penna in Treatment: [place] reviewed with physician Active Inactive Abuse / Safety / Falls / Self Care Management Nursing Diagnoses: Potential for falls Goals: Patient/caregiver will verbalize/demonstrate measures taken to prevent injury and/or falls Date Initiated: 04/14/2021 Target Resolution Date: 07/07/2021 Goal Status:  Active Interventions: Assess fall risk on admission and as needed Notes: 05/19/21 pt fell yesterday and bumped leg Venous Leg Ulcer Nursing Diagnoses: Knowledge deficit related to disease process and management Potential for venous Insuffiency (use before diagnosis confirmed) Goals: Patient will maintain optimal edema control Date Initiated: 04/14/2021 Target Resolution Date: 07/07/2021 Goal Status: Active Interventions: Assess peripheral edema status every visit. Compression as ordered Provide education on venous insufficiency Treatment Activities: Therapeutic compression applied : 04/14/2021 Notes: 06/09/21: Edema control ongoing. Wound/Skin Impairment Nursing Diagnoses: Impaired tissue integrity Knowledge deficit related to ulceration/compromised skin integrity Goals: Patient/caregiver will verbalize understanding of skin care regimen Date Initiated: 04/14/2021 Target Resolution Date: 07/07/2021 Goal Status: Active Ulcer/skin breakdown will have a volume reduction of 30% by week 4 Date Initiated: 04/14/2021 Date Inactivated: 05/12/2021 Target Resolution Date: 05/12/2021 Goal Status: Met Ulcer/skin breakdown will have a volume reduction of 50% by week 8 Date Initiated: 05/12/2021 Date Inactivated: 06/09/2021 Target Resolution Date: 06/09/2021 Goal Status: Met Ulcer/skin breakdown will have a volume reduction of 80% by week 12 Date Initiated: 06/09/2021 Target Resolution Date: 07/07/2021 Goal Status: Active Interventions: Assess patient/caregiver ability to obtain necessary supplies Assess patient/caregiver ability to perform ulcer/skin care regimen upon admission and as needed Assess ulceration(s) every visit Provide education on ulcer and skin care Treatment Activities: Skin care regimen initiated : 04/14/2021 Topical wound management initiated : 04/14/2021 Notes: Electronic Signature(s) Signed: 06/16/2021 5:28:58 PM By: [name] Entered By: [name] on 06/16/2021  07:59:24 -------------------------------------------------------------------------------- Pain Assessment Details Patient Name: Date of Service: [place], [name] 06/16/2021 8:00 A M Medical Record Number: [id number] Patient Account Number: [id number] Date of Birth/Sex: Treating RN: 01/19/1951 (70 y.o. [name] Primary Care Louis Gaw: [name] Other Clinician: Referring Abhay Godbolt: Treating Tyon Cerasoli/Extender: Vickii Penna in Treatment: 9 Active Problems Location of Pain Severity and Description of Pain Patient Has Paino No Site Locations Pain Management and Medication Current Pain Management: Electronic Signature(s) Signed: 06/16/2021 5:28:58 PM By: [name] Entered By: [name] on 06/16/2021 07:54:18 -------------------------------------------------------------------------------- Patient/Caregiver Education Details Patient Name: Date of Service: [place], [name] 8/31/2022andnbsp8:00 A M Medical Record Number: [id number] Patient Account Number: [id number] Date of Birth/Gender: Treating RN: 07/02/1951 (70 y.o. [name] Primary Care Physician: [name] Other Clinician: Referring Physician: Treating Physician/Extender: Vickii Penna in Treatment: 9 Education Assessment Education Provided To: Patient Education Topics Provided Venous: Methods: Explain/Verbal, Printed Responses: State content correctly Wound/Skin Impairment: Methods: Explain/Verbal, Printed Responses: State content correctly Electronic Signature(s) Signed: 06/16/2021 5:28:58 PM By: [name] Entered By: [name] on 06/16/2021 08:00:45 -------------------------------------------------------------------------------- Wound Assessment Details Patient Name: Date of Service: [place], [name]  A. 06/16/2021 8:00 A M Medical Record Number: [id number] Patient Account Number: [id number] Date of Birth/Sex: Treating  RN: April 16, 1951 (70 y.o. [name] Primary Care Drisana Schweickert: [name] Other Clinician: Referring Jo Cerone: Treating Shaynna Husby/Extender: Vickii Penna in Treatment: 9 Wound Status Wound Number: 1 Primary Trauma, Other Etiology: Wound Location: Right, Anterior Lower Leg Wound Open Wounding Event: Trauma Status: Date Acquired: 03/17/2021 Comorbid Coronary Artery Disease, Hypertension, Myocardial Infarction, Weeks Of Treatment: 9 History: Neuropathy, Received Chemotherapy, Received Radiation Clustered Wound: No Photos Wound Measurements Length: (cm) 0.2 Width: (cm) 0.2 Depth: (cm) 0.1 Area: (cm) 0.031 Volume: (cm) 0.003 % Reduction in Area: 98.1% % Reduction in Volume: 98.2% Epithelialization: Medium (34-66%) Tunneling: No Undermining: No Wound Description Classification: Full Thickness Without Exposed Support Structures Wound Margin: Distinct, outline attached Exudate Amount: Medium Exudate Type: Serosanguineous Exudate Color: red, brown Foul Odor After Cleansing: No Slough/Fibrino No Wound Bed Granulation Amount: Large (67-100%) Exposed Structure Granulation Quality: Red, Pink Fascia Exposed: No Necrotic Amount: None Present (0%) Fat Layer (Subcutaneous Tissue) Exposed: Yes Tendon Exposed: No Muscle Exposed: No Joint Exposed: No Bone Exposed: No Treatment Notes Wound #1 (Lower Leg) Wound Laterality: Right, Anterior Cleanser Soap and Water Discharge Instruction: May shower and wash wound with dial antibacterial soap and water prior to dressing change. Wound Cleanser Discharge Instruction: Cleanse the wound with wound cleanser prior to applying a clean dressing using gauze sponges, not tissue or cotton balls. Peri-Wound Care Sween Lotion (Moisturizing lotion) Discharge Instruction: Apply moisturizing lotion as directed Topical Primary Dressing Xeroform Occlusive Gauze Dressing, 4x4 in Discharge Instruction: Apply to wound bed  as instructed Secondary Dressing Woven Gauze Sponge, Non-Sterile 4x4 in Discharge Instruction: Apply over primary dressing as directed. Secured With Compression Wrap ThreePress (3 layer compression wrap) Discharge Instruction: Apply three layer compression as directed. Compression Stockings Add-Ons Electronic Signature(s) Signed: 06/16/2021 8:29:32 AM By: Sandre Kitty Signed: 06/16/2021 5:28:58 PM By: [name] Entered By: Sandre Kitty on 06/16/2021 08:02:56 -------------------------------------------------------------------------------- Wound Assessment Details Patient Name: Date of Service: [place], [name] 06/16/2021 8:00 A M Medical Record Number: [id number] Patient Account Number: [id number] Date of Birth/Sex: Treating RN: October 05, 1951 (70 y.o. [name] Primary Care Chou Busler: [name] Other Clinician: Referring Kaisy Severino: Treating Sherwin Hollingshed/Extender: Vickii Penna in Treatment: 9 Wound Status Wound Number: 2 Primary Trauma, Other Etiology: Wound Location: Right, Lateral Lower Leg Wound Open Wounding Event: Trauma Status: Date Acquired: 05/15/2021 Comorbid Coronary Artery Disease, Hypertension, Myocardial Infarction, Weeks Of Treatment: 4 History: Neuropathy, Received Chemotherapy, Received Radiation Clustered Wound: No Photos Wound Measurements Length: (cm) 0.5 Width: (cm) 0.5 Depth: (cm) 0.1 Area: (cm) 0.196 Volume: (cm) 0.02 % Reduction in Area: 30.7% % Reduction in Volume: 28.6% Epithelialization: Small (1-33%) Tunneling: No Undermining: No Wound Description Classification: Full Thickness Without Exposed Support Structures Wound Margin: Distinct, outline attached Exudate Amount: Medium Exudate Type: Sanguinous Exudate Color: red Foul Odor After Cleansing: No Slough/Fibrino No Wound Bed Granulation Amount: Large (67-100%) Exposed Structure Granulation Quality: Red Fascia Exposed: No Necrotic Amount:  None Present (0%) Fat Layer (Subcutaneous Tissue) Exposed: Yes Tendon Exposed: No Muscle Exposed: No Joint Exposed: No Bone Exposed: No Treatment Notes Wound #2 (Lower Leg) Wound Laterality: Right, Lateral Cleanser Soap and Water Discharge Instruction: May shower and wash wound with dial antibacterial soap and water prior to dressing change. Wound Cleanser Discharge Instruction: Cleanse the wound with wound cleanser prior to applying a clean dressing using gauze sponges, not tissue or cotton balls. Peri-Wound Care Sween  Lotion (Moisturizing lotion) Discharge Instruction: Apply moisturizing lotion as directed Topical Primary Dressing Xeroform Occlusive Gauze Dressing, 4x4 in Discharge Instruction: Apply to wound bed as instructed Secondary Dressing Woven Gauze Sponge, Non-Sterile 4x4 in Discharge Instruction: Apply over primary dressing as directed. Secured With Compression Wrap ThreePress (3 layer compression wrap) Discharge Instruction: Apply three layer compression as directed. Compression Stockings Add-Ons Electronic Signature(s) Signed: 06/16/2021 8:29:32 AM By: Sandre Kitty Signed: 06/16/2021 5:28:58 PM By: [name] Entered By: Sandre Kitty on 06/16/2021 08:03:16 -------------------------------------------------------------------------------- Vitals Details Patient Name: Date of Service: [place], [name] 06/16/2021 8:00 A M Medical Record Number: [id number] Patient Account Number: [id number] Date of Birth/Sex: Treating RN: 04/09/1951 (70 y.o. [name] Primary Care Jax Abdelrahman: [name] Other Clinician: Referring Jacier Gladu: Treating Hershey Knauer/Extender: Vickii Penna in Treatment: 9 Vital Signs Time Taken: 07:54 Temperature (F): 98.4 Height (in): 68 Pulse (bpm): 105 Weight (lbs): 157 Respiratory Rate (breaths/min): 16 Body Mass Index (BMI): 23.9 Blood Pressure (mmHg): 133/67 Reference Range: 80 - 120 mg /  dl Electronic Signature(s) Signed: 06/16/2021 5:28:58 PM By: [name] Entered By: [name] on 06/16/2021 07:55:16

## 2021-06-16 NOTE — Progress Notes (Signed)
[name] Riley, [name] Riley ([id number]) Visit Report for 06/16/2021 Chief Complaint Document Details Patient Name: Date of Service: [name] Riley, [name] A. 06/16/2021 8:00 A M Medical Record Number: [id number] Patient Account Number: [id number] Date of Birth/Sex: Treating RN: [birth year] (70 y.o. [name] Primary Care Provider: [name] Other Clinician: Referring Provider: Treating Provider/Extender: Vickii Penna in Treatment: 9 Information Obtained from: Patient Chief Complaint Right LE Ulcer Electronic Signature(s) Signed: 06/16/2021 8:55:27 AM By: Worthy Keeler PA-C Entered By: Worthy Keeler on 06/16/2021 08:55:27 -------------------------------------------------------------------------------- Debridement Details Patient Name: Date of Service: [name] Riley, [name] 06/16/2021 8:00 A M Medical Record Number: [id number] Patient Account Number: [id number] Date of Birth/Sex: Treating RN: Sep 20, 1951 (70 y.o. [name] Primary Care Provider: [name] Other Clinician: Referring Provider: Treating Provider/Extender: Vickii Penna in Treatment: 9 Debridement Performed for Assessment: Wound #2 Right,Lateral Lower Leg Performed By: Physician Worthy Keeler, PA Debridement Type: Debridement Level of Consciousness (Pre-procedure): Awake and Alert Pre-procedure Verification/Time Out Yes - 08:29 Taken: Start Time: 08:30 T Area Debrided (L x W): otal 0.5 (cm) x 0.5 (cm) = 0.25 (cm) Tissue and other material debrided: Non-Viable, Slough, Subcutaneous, Slough Level: Skin/Subcutaneous Tissue Debridement Description: Excisional Instrument: Curette Bleeding: Minimum Hemostasis Achieved: Pressure End Time: 08:32 Response to Treatment: Procedure was tolerated well Level of Consciousness (Post- Awake and Alert procedure): Post Debridement Measurements of Total Wound Length: (cm) 0.5 Width: (cm) 0.5 Depth: (cm) 0.1 Volume: (cm)  0.02 Character of Wound/Ulcer Post Debridement: Stable Post Procedure Diagnosis Same as Pre-procedure Electronic Signature(s) Signed: 06/16/2021 5:21:16 PM By: Worthy Keeler PA-C Signed: 06/16/2021 5:28:58 PM By: [name] Entered By: [name] on 06/16/2021 08:32:43 -------------------------------------------------------------------------------- HPI Details Patient Name: Date of Service: [name] Riley, [name] 06/16/2021 8:00 A M Medical Record Number: [id number] Patient Account Number: [id number] Date of Birth/Sex: Treating RN: 1951-03-09 (70 y.o. [name] Primary Care Provider: [name] Other Clinician: Referring Provider: Treating Provider/Extender: Vickii Penna in Treatment: 9 History of Present Illness HPI Description: 04/14/2021 upon evaluation today patient presents for initial inspection here in the clinic concerning issues that she has been having with a wound on her right anterior lower leg after she had an abrasion trauma 4 weeks ago. She tells me that unfortunately following this trauma she has been experiencing quite a bit of discomfort at times with some burning and stinging. She is also had a scab up once or twice but states that right now she has been unable to really get it to scab back over. This has been a frustration for her and she is not really able to get it to heal as quickly and effectively as she would like to see. The patient also tells me that she has been tolerating the dressing changes without complication. She has just been using over-the-counter antibiotic ointment and try to keep it covered with a dry dressing at times depending on what she was told to do. Nonetheless right now she is also been on Keflex that did not really seem to help she was switched over to Augmentin she does started that on Friday. Currently I do not see anything that seems to be obviously infected but nonetheless I think it is fine for her  to take the Augmentin just to make sure that we do not get anything that initiates in the interim. The patient is currently undergoing chemotherapy she has had previous radiation. Nonetheless in  regard to the chemotherapy this could be affecting her ability to heal to some degree. 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 She has hypertension, neuropathy associated with the chemotherapy, and a history of heart disease. She is currently again undergoing active chemotherapy for recurrent breast cancer. 04/21/2021 upon evaluation today patient appears to be doing well with regard to her leg ulcer. This is actually looking significantly improved. There does not appear to be any signs of infection visually and I am happy in that regard. Fortunately there is no signs of systemic infection either which is also great news. No fevers, chills, nausea, vomiting, or diarrhea. 05/05/2021 upon evaluation today patient appears to be doing well with regard to her leg ulcer. She has been tolerating the dressing changes without complication. Fortunately there does not appear to be any signs of infection which is great news. No fevers, chills, nausea, vomiting, or diarrhea. 05/12/2021 upon evaluation today patient actually appears to be making good improvement in general. I am extremely pleased with where things stand today. There is no signs of active infection which is also great news. 05/19/2021 upon evaluation today patient appears to be doing well with regard to her wound. This is actually showing signs of excellent granulation epithelization at this point. I do not see any evidence of infection which is great and overall I am extremely pleased with where we stand today. In general I think that the patient is making leasing strides towards getting this healed. The issue that she had unfortunately her mother did  pass this past week. She was at the wedding yesterday when she actually felt faint coming out of the church there is a lot of emotional situation involved obviously. Nonetheless she somewhat started to pass out her brothers caught her but she did hit her leg laterally underneath the wrap on the side of the steps. This caused a small skin tear and some contusion fortunately this is not too bad but nonetheless I do think that this needs to be monitored currently. 05/26/2021 upon evaluation today patient appears to be doing excellent in regard to her wound on the leg. This is can require some sharp debridement but in general I feel like she is doing very well. The new skin tear that was noted last week is actually healing quite nicely reattached and has done great I think this will probably be pretty much resolved by next week and then should start just even an hour after that. As far as the color of the skin is concerned the bruising will fade is what I mean by that. 06/05/2021 upon evaluation today patient appears to be doing well with regard to her wound. She has been tolerating the dressing changes without complication. Fortunately there does not appear to be any signs of infection the good news is she is actually healing quite nicely and very close to resolution on the anterior wound and the new wound that was on the right last time where she had struck this during almost collapsing at her mom's funeral actually seems to be doing better although there is still a small opening here as well. 06/16/2021 upon evaluation today patient appears to be doing well with regard to her leg ulcer. She has been tolerating the dressing changes without complication. Fortunately there does not appear to be any signs of active infection at this time. No fevers, chills, nausea, vomiting, or  diarrhea. Electronic Signature(s) Signed: 06/16/2021 9:14:56 AM By: Worthy Keeler PA-C Entered By: Worthy Keeler on 06/16/2021  09:14:56 -------------------------------------------------------------------------------- Physical Exam Details Patient Name: Date of Service: [name] Riley, [name] 06/16/2021 8:00 A M Medical Record Number: [id number] Patient Account Number: [id number] Date of Birth/Sex: Treating RN: 1951/05/22 (70 y.o. [name] Primary Care Provider: [name] Other Clinician: Referring Provider: Treating Provider/Extender: Vickii Penna in Treatment: 9 Constitutional Well-nourished and well-hydrated in no acute distress. Respiratory normal breathing without difficulty. Psychiatric this patient is able to make decisions and demonstrates good insight into disease process. Alert and Oriented x 3. pleasant and cooperative. Notes Upon inspection patient's wound bed showed signs of good granulation and epithelization at this point. Fortunately there does not appear to be any evidence of active infection systemically which is great and overall I am extremely pleased with where things stand today. No fevers, chills, nausea, vomiting, or diarrhea. I did have to perform some sharp debridement to clear away some of the necrotic debris on the new wound which was actually the main wound of concern here the patient tolerated that debridement without complication. Electronic Signature(s) Signed: 06/16/2021 9:15:29 AM By: Worthy Keeler PA-C Entered By: Worthy Keeler on 06/16/2021 09:15:29 -------------------------------------------------------------------------------- Physician Orders Details Patient Name: Date of Service: [name] Riley, [name] 06/16/2021 8:00 A M Medical Record Number: [id number] Patient Account Number: [id number] Date of Birth/Sex: Treating RN: Feb 28, 1951 (70 y.o. 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 - with Margarita Grizzle Return A Bathing/ Shower/ Hygiene May shower with protection but do not get wound dressing(s) wet. Edema Control - Lymphedema / SCD / Other Right Lower Extremity Elevate legs to the level of the heart or above for 30 minutes daily and/or when sitting, a frequency of: - 3-4 times per day. Try to avoid sitting with legs down for long periods of time. Avoid standing for long periods of time. Exercise regularly Additional Orders / Instructions Follow Nutritious Diet Wound Treatment Wound #1 - Lower Leg Wound Laterality: Right, Anterior Cleanser: Soap and Water 1 x Per Week/30 Days Discharge Instructions: May shower and wash wound with dial antibacterial soap and water prior to dressing change. Cleanser: Wound Cleanser 1 x Per Week/30 Days Discharge Instructions: Cleanse the wound with wound cleanser prior to applying a clean dressing using gauze sponges, not tissue or cotton balls. Peri-Wound Care: Sween Lotion (Moisturizing lotion) 1 x Per Week/30 Days Discharge Instructions: Apply moisturizing lotion as directed Prim Dressing: Xeroform Occlusive Gauze Dressing, 4x4 in 1 x Per Week/30 Days ary Discharge Instructions: Apply to wound bed as instructed Secondary Dressing: Woven Gauze Sponge, Non-Sterile 4x4 in 1 x Per Week/30 Days Discharge Instructions: Apply over primary dressing as directed. Compression Wrap:  ThreePress (3 layer compression wrap) 1 x Per Week/30 Days Discharge Instructions: Apply three layer compression as directed. Wound #2 - Lower Leg Wound Laterality: Right, Lateral Cleanser: Soap and Water 1  x Per Week/30 Days Discharge Instructions: May shower and wash wound with dial antibacterial soap and water prior to dressing change. Cleanser: Wound Cleanser 1 x Per Week/30 Days Discharge Instructions: Cleanse the wound with wound cleanser prior to applying a clean dressing using gauze sponges, not tissue or cotton balls. Peri-Wound Care: Sween Lotion (Moisturizing lotion) 1 x Per Week/30 Days Discharge Instructions: Apply moisturizing lotion as directed Prim Dressing: Xeroform Occlusive Gauze Dressing, 4x4 in 1 x Per Week/30 Days ary Discharge Instructions: Apply to wound bed as instructed Secondary Dressing: Woven Gauze Sponge, Non-Sterile 4x4 in 1 x Per Week/30 Days Discharge Instructions: Apply over primary dressing as directed. Compression Wrap: ThreePress (3 layer compression wrap) 1 x Per Week/30 Days Discharge Instructions: Apply three layer compression as directed. Electronic Signature(s) Signed: 06/16/2021 5:21:16 PM By: Worthy Keeler PA-C Signed: 06/16/2021 5:28:58 PM By: [name] Previous Signature: 06/16/2021 8:24:26 AM Version By: [name] Entered By: [name] on 06/16/2021 08:31:43 -------------------------------------------------------------------------------- Problem List Details Patient Name: Date of Service: [name] Riley, [name] 06/16/2021 8:00 A M Medical Record Number: [id number] Patient Account Number: [id number] Date of Birth/Sex: Treating RN: 1951-03-03 (70 y.o. [name] Primary Care Provider: [name] Other Clinician: Referring Provider: Treating Provider/Extender: Vickii Penna in Treatment: 9 Active Problems ICD-10 Encounter Code Description Active Date MDM Diagnosis S80.811A Abrasion, right  lower leg, initial encounter 04/14/2021 No Yes I87.2 Venous insufficiency (chronic) (peripheral) 04/14/2021 No Yes L97.812 Non-pressure chronic ulcer of other part of right lower leg with fat layer 04/14/2021 No Yes exposed G90.09 Other idiopathic peripheral autonomic neuropathy 04/14/2021 No Yes C79.81 Secondary malignant neoplasm of breast 04/14/2021 No Yes I10 Essential (primary) hypertension 04/14/2021 No Yes I25.10 Atherosclerotic heart disease of native coronary artery without angina pectoris 04/14/2021 No Yes Z92.21 Personal history of antineoplastic chemotherapy 04/14/2021 No Yes Inactive Problems Resolved Problems Electronic Signature(s) Signed: 06/16/2021 8:55:14 AM By: Worthy Keeler PA-C Entered By: Worthy Keeler on 06/16/2021 08:55:13 -------------------------------------------------------------------------------- Progress Note Details Patient Name: Date of Service: [name] Riley, [name] 06/16/2021 8:00 A M Medical Record Number: [id number] Patient Account Number: [id number] Date of Birth/Sex: Treating RN: 06-Jun-1951 (70 y.o. [name] Primary Care Provider: [name] Other Clinician: Referring Provider: Treating Provider/Extender: Vickii Penna in Treatment: 9 Subjective Chief Complaint Information obtained from Patient Right LE Ulcer History of Present Illness (HPI) 04/14/2021 upon evaluation today patient presents for initial inspection here in the clinic concerning issues that she has been having with a wound on her right anterior lower leg after she had an abrasion trauma 4 weeks ago. She tells me that unfortunately following this trauma she has been experiencing quite a bit of discomfort at times with some burning and stinging. She is also had a scab up once or twice but states that right now she has been unable to really get it to scab back over. This has been a frustration for her and she is not really able to get it to heal as quickly  and effectively as she would like to see. The patient also tells me that she has been tolerating the dressing changes without complication. She has just been using over-the-counter antibiotic ointment and try to keep it covered with a dry dressing at times depending on what she was told to do. Nonetheless right now she is also been on Keflex that did not really seem to help she was switched over to Augmentin she does started that  on Friday. Currently I do not see anything that seems to be obviously infected but nonetheless I think it is fine for her to take the Augmentin just to make sure that we do not get anything that initiates in the interim. The patient is currently undergoing chemotherapy she has had previous radiation. Nonetheless in regard to the chemotherapy this could be affecting her ability to heal to some degree. 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 She has hypertension, neuropathy associated with the chemotherapy, and a history of heart disease. She is currently again undergoing active chemotherapy for recurrent breast cancer. 04/21/2021 upon evaluation today patient appears to be doing well with regard to her leg ulcer. This is actually looking significantly improved. There does not appear to be any signs of infection visually and I am happy in that regard. Fortunately there is no signs of systemic infection either which is also great news. No fevers, chills, nausea, vomiting, or diarrhea. 05/05/2021 upon evaluation today patient appears to be doing well with regard to her leg ulcer. She has been tolerating the dressing changes without complication. Fortunately there does not appear to be any signs of infection which is great news. No fevers, chills, nausea, vomiting, or diarrhea. 05/12/2021 upon evaluation today patient actually appears to be making good improvement in  general. I am extremely pleased with where things stand today. There is no signs of active infection which is also great news. 05/19/2021 upon evaluation today patient appears to be doing well with regard to her wound. This is actually showing signs of excellent granulation epithelization at this point. I do not see any evidence of infection which is great and overall I am extremely pleased with where we stand today. In general I think that the patient is making leasing strides towards getting this healed. The issue that she had unfortunately her mother did pass this past week. She was at the wedding yesterday when she actually felt faint coming out of the church there is a lot of emotional situation involved obviously. Nonetheless she somewhat started to pass out her brothers caught her but she did hit her leg laterally underneath the wrap on the side of the steps. This caused a small skin tear and some contusion fortunately this is not too bad but nonetheless I do think that this needs to be monitored currently. 05/26/2021 upon evaluation today patient appears to be doing excellent in regard to her wound on the leg. This is can require some sharp debridement but in general I feel like she is doing very well. The new skin tear that was noted last week is actually healing quite nicely reattached and has done great I think this will probably be pretty much resolved by next week and then should start just even an hour after that. As far as the color of the skin is concerned the bruising will fade is what I mean by that. 06/05/2021 upon evaluation today patient appears to be doing well with regard to her wound. She has been tolerating the dressing changes without complication. Fortunately there does not appear to be any signs of infection the good news is she is actually healing quite nicely and very close to resolution on the anterior wound and the new wound that was on the right last time where she had  struck this during almost collapsing at her mom's funeral actually seems to be doing better  although there is still a small opening here as well. 06/16/2021 upon evaluation today patient appears to be doing well with regard to her leg ulcer. She has been tolerating the dressing changes without complication. Fortunately there does not appear to be any signs of active infection at this time. No fevers, chills, nausea, vomiting, or diarrhea. Objective Constitutional Well-nourished and well-hydrated in no acute distress. Vitals Time Taken: 7:54 AM, Height: 68 in, Weight: 157 lbs, BMI: 23.9, Temperature: 98.4 F, Pulse: 105 bpm, Respiratory Rate: 16 breaths/min, Blood Pressure: 133/67 mmHg. Respiratory normal breathing without difficulty. Psychiatric this patient is able to make decisions and demonstrates good insight into disease process. Alert and Oriented x 3. pleasant and cooperative. General Notes: Upon inspection patient's wound bed showed signs of good granulation and epithelization at this point. Fortunately there does not appear to be any evidence of active infection systemically which is great and overall I am extremely pleased with where things stand today. No fevers, chills, nausea, vomiting, or diarrhea. I did have to perform some sharp debridement to clear away some of the necrotic debris on the new wound which was actually the main wound of concern here the patient tolerated that debridement without complication. Integumentary (Hair, Skin) Wound #1 status is Open. Original cause of wound was Trauma. The date acquired was: 03/17/2021. The wound has been in treatment 9 weeks. The wound is located on the Right,Anterior Lower Leg. The wound measures 0.2cm length x 0.2cm width x 0.1cm depth; 0.031cm^2 area and 0.003cm^3 volume. There is Fat Layer (Subcutaneous Tissue) exposed. There is no tunneling or undermining noted. There is a medium amount of serosanguineous drainage noted. The  wound margin is distinct with the outline attached to the wound base. There is large (67-100%) red, pink granulation within the wound bed. There is no necrotic tissue within the wound bed. Wound #2 status is Open. Original cause of wound was Trauma. The date acquired was: 05/15/2021. The wound has been in treatment 4 weeks. The wound is located on the Right,Lateral Lower Leg. The wound measures 0.5cm length x 0.5cm width x 0.1cm depth; 0.196cm^2 area and 0.02cm^3 volume. There is Fat Layer (Subcutaneous Tissue) exposed. There is no tunneling or undermining noted. There is a medium amount of sanguinous drainage noted. The wound margin is distinct with the outline attached to the wound base. There is large (67-100%) red granulation within the wound bed. There is no necrotic tissue within the wound bed. 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 There was a Excisional Skin/Subcutaneous Tissue Debridement with a total area of 0.25 sq cm performed by Worthy Keeler, PA. With the following instrument(s): Curette to remove Non-Viable tissue/material. Material removed includes Subcutaneous Tissue and Slough and. No specimens were taken. A time out was conducted at 08:29, prior to the start of the procedure. A Minimum amount of bleeding was controlled with Pressure. The procedure was tolerated well. Post Debridement Measurements: 0.5cm length x 0.5cm width x 0.1cm depth; 0.02cm^3  volume. Character of Wound/Ulcer Post Debridement is stable. Post procedure Diagnosis Wound #2: Same as Pre-Procedure Pre-procedure diagnosis of Wound #2  is a Trauma, Other located on the Right,Lateral Lower Leg . There was a Three Layer Compression Therapy Procedure by [name], RN. Post procedure Diagnosis Wound #2: Same as Pre-Procedure Wound #1 Pre-procedure diagnosis of Wound #1 is a Trauma, Other located on the Right,Anterior Lower Leg . There was a Three Layer Compression Therapy Procedure by [name], RN. Post procedure Diagnosis Wound #1: Same as Pre-Procedure Plan Follow-up Appointments: Return Appointment in 1 week. - with Glynn Octave Shower/ Hygiene: May shower with protection but do not get wound dressing(s) wet. Edema Control - Lymphedema / SCD / Other: Elevate legs to the level of the heart or above for 30 minutes daily and/or when sitting, a frequency of: - 3-4 times per day. Try to avoid sitting with legs down for long periods of time. Avoid standing for long periods of time. Exercise regularly Additional Orders / Instructions: Follow Nutritious Diet WOUND #1: - Lower Leg Wound Laterality: Right, Anterior Cleanser: Soap and Water 1 x Per Week/30 Days Discharge Instructions: May shower and wash wound with dial antibacterial soap and water prior to dressing change. Cleanser: Wound Cleanser 1 x Per Week/30 Days Discharge Instructions: Cleanse the wound with wound cleanser prior to applying a clean dressing using gauze sponges, not tissue or cotton balls. Peri-Wound Care: Sween Lotion (Moisturizing lotion) 1 x Per Week/30 Days Discharge Instructions: Apply moisturizing lotion as directed Prim Dressing: Xeroform Occlusive Gauze Dressing, 4x4 in 1 x Per Week/30 Days ary Discharge Instructions: Apply to wound bed as instructed Secondary Dressing: Woven Gauze Sponge, Non-Sterile 4x4 in 1 x Per Week/30 Days Discharge Instructions: Apply over primary dressing as  directed. Com pression Wrap: ThreePress (3 layer compression wrap) 1 x Per Week/30 Days Discharge Instructions: Apply three layer compression as directed. WOUND #2: - Lower Leg Wound Laterality: Right, Lateral Cleanser: Soap and Water 1 x Per Week/30 Days Discharge Instructions: May shower and wash wound with dial antibacterial soap and water prior to dressing change. Cleanser: Wound Cleanser 1 x Per Week/30 Days Discharge Instructions: Cleanse the wound with wound cleanser prior to applying a clean dressing using gauze sponges, not tissue or cotton balls. Peri-Wound Care: Sween Lotion (Moisturizing lotion) 1 x Per Week/30 Days Discharge Instructions: Apply moisturizing lotion as directed Prim Dressing: Xeroform Occlusive Gauze Dressing, 4x4 in 1 x Per Week/30 Days ary Discharge Instructions: Apply to wound bed as instructed Secondary Dressing: Woven Gauze Sponge, Non-Sterile 4x4 in 1 x Per Week/30 Days Discharge Instructions: Apply over primary dressing as directed. Com pression Wrap: ThreePress (3 layer compression wrap) 1 x Per Week/30 Days Discharge Instructions: Apply three layer compression as directed. 1. Would recommend currently that we going to continue with the Xeroform I think that still doing probably about the best for the time being. We will see how things look next week but overall I think that we are headed in the appropriate direction here. 2. I am also can recommend that we have the patient continue to use the compression wrap. I do feel like the 3 layer compression wrap has been of benefit for the patient and as such I would recommend that we continue with that plan for the time being as well. We will see patient back for reevaluation in 1 week here in the clinic. If anything worsens or changes patient will contact our office for additional recommendations. Electronic Signature(s) Signed: 06/16/2021 9:16:10 AM By: Worthy Keeler PA-C Entered By: Worthy Keeler on  06/16/2021 09:16:10 -------------------------------------------------------------------------------- SuperBill Details Patient Name: Date  of Service: [name] Riley, [name] A. 06/16/2021 Medical Record Number: [id number] Patient Account Number: [id number] Date of Birth/Sex: Treating RN: 02-07-1951 (70 y.o. 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 antineoplastic chemotherapy Facility Procedures CPT4 Code: [id number] Description: [id number] - DEB SUBQ TISSUE 20 SQ CM/< ICD-10 Diagnosis Description L97.812 Non-pressure chronic ulcer of other part of right lower leg with fat layer exp Modifier: osed Quantity: 1 Physician Procedures : CPT4 Code Description Modifier [id number] - WC PHYS SUBQ TISS 20 SQ CM ICD-10 Diagnosis Description [id number] Non-pressure chronic ulcer of other part of right lower leg with fat layer exposed Quantity: 1 Electronic Signature(s) Signed: 06/16/2021 9:17:30 AM By: Worthy Keeler PA-C Entered By: Worthy Keeler on 06/16/2021 09:17:29

## 2021-06-23 ENCOUNTER — Encounter (HOSPITAL_BASED_OUTPATIENT_CLINIC_OR_DEPARTMENT_OTHER): Payer: Medicare Other | Attending: Physician Assistant | Admitting: Physician Assistant

## 2021-06-23 ENCOUNTER — Other Ambulatory Visit: Payer: Self-pay

## 2021-06-23 DIAGNOSIS — S51811A Laceration without foreign body of right forearm, initial encounter: Secondary | ICD-10-CM | POA: Insufficient documentation

## 2021-06-23 DIAGNOSIS — I2510 Atherosclerotic heart disease of native coronary artery without angina pectoris: Secondary | ICD-10-CM | POA: Insufficient documentation

## 2021-06-23 DIAGNOSIS — C7981 Secondary malignant neoplasm of breast: Secondary | ICD-10-CM | POA: Insufficient documentation

## 2021-06-23 DIAGNOSIS — Z9221 Personal history of antineoplastic chemotherapy: Secondary | ICD-10-CM | POA: Diagnosis not present

## 2021-06-23 DIAGNOSIS — I10 Essential (primary) hypertension: Secondary | ICD-10-CM | POA: Insufficient documentation

## 2021-06-23 DIAGNOSIS — I872 Venous insufficiency (chronic) (peripheral): Secondary | ICD-10-CM | POA: Diagnosis present

## 2021-06-23 DIAGNOSIS — S80811A Abrasion, right lower leg, initial encounter: Secondary | ICD-10-CM | POA: Insufficient documentation

## 2021-06-23 DIAGNOSIS — G9009 Other idiopathic peripheral autonomic neuropathy: Secondary | ICD-10-CM | POA: Diagnosis not present

## 2021-06-23 DIAGNOSIS — I252 Old myocardial infarction: Secondary | ICD-10-CM | POA: Insufficient documentation

## 2021-06-23 DIAGNOSIS — X58XXXA Exposure to other specified factors, initial encounter: Secondary | ICD-10-CM | POA: Insufficient documentation

## 2021-06-23 DIAGNOSIS — L97812 Non-pressure chronic ulcer of other part of right lower leg with fat layer exposed: Secondary | ICD-10-CM | POA: Diagnosis not present

## 2021-06-23 DIAGNOSIS — Z923 Personal history of irradiation: Secondary | ICD-10-CM | POA: Diagnosis not present

## 2021-06-23 NOTE — Progress Notes (Addendum)
GLENDENE, LANGBEIN ([id number]) Visit Report for 06/23/2021 Chief Complaint Document Details Patient Name: Date of Service: [name] Riley, [name] A. 06/23/2021 8:00 A M Medical Record Number: [id number] Patient Account Number: [id number] Date of Birth/Sex: Treating RN: 04/13/1951 (70 y.o. [name] Riley Primary Care Provider: [name] Other Clinician: Referring Provider: Treating Provider/Extender: Vickii Penna in Treatment: 10 Information Obtained from: Patient Chief Complaint Right LE Ulcer Electronic Signature(s) Signed: 06/23/2021 8:12:27 AM By: Worthy Keeler PA-C Entered By: Worthy Keeler on 06/23/2021 08:12:27 -------------------------------------------------------------------------------- HPI Details Patient Name: Date of Service: [name], [name] 06/23/2021 8:00 A M Medical Record Number: [id number] Patient Account Number: [id number] Date of Birth/Sex: Treating RN: Nov 23, 1950 (70 y.o. [name] Riley Primary Care Provider: [name] Other Clinician: Referring Provider: Treating Provider/Extender: Vickii Penna in Treatment: 10 History of Present Illness HPI Description: 04/14/2021 upon evaluation today patient presents for initial inspection here in the clinic concerning issues that she has been having with a wound on her right anterior lower leg after she had an abrasion trauma 4 weeks ago. She tells me that unfortunately following this trauma she has been experiencing quite a bit of discomfort at times with some burning and stinging. She is also had a scab up once or twice but states that right now she has been unable to really get it to scab back over. This has been a frustration for her and she is not really able to get it to heal as quickly and effectively as she would like to see. The patient also tells me that she has been tolerating the dressing changes without complication. She has just been using over-the-counter  antibiotic ointment and try to keep it covered with a dry dressing at times depending on what she was told to do. Nonetheless right now she is also been on Keflex that did not really seem to help she was switched over to Augmentin she does started that on Friday. Currently I do not see anything that seems to be obviously infected but nonetheless I think it is fine for her to take the Augmentin just to make sure that we do not get anything that initiates in the interim. The patient is currently undergoing chemotherapy she has had previous radiation. Nonetheless in regard to the chemotherapy this could be affecting her ability to heal to some degree. 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 She has hypertension, neuropathy associated with the chemotherapy, and a history of heart disease. She is currently again undergoing active chemotherapy for recurrent breast cancer. 04/21/2021 upon evaluation today patient appears to be doing well with regard to her leg ulcer. This is actually looking significantly improved. There does not appear to be any signs of infection visually and I am happy in that regard. Fortunately there is no signs of systemic infection either which is also great news. No fevers, chills, nausea, vomiting, or diarrhea. 05/05/2021 upon evaluation today patient appears to be doing well with regard to her leg ulcer. She has been tolerating the dressing changes without complication. Fortunately there does not appear to be any signs of infection which is great news. No fevers, chills, nausea, vomiting, or diarrhea. 05/12/2021 upon evaluation today patient actually appears to be making good improvement in general. I am extremely pleased with where things stand today. There is no signs of active infection which  is also great news. 05/19/2021 upon evaluation today patient appears to be doing  well with regard to her wound. This is actually showing signs of excellent granulation epithelization at this point. I do not see any evidence of infection which is great and overall I am extremely pleased with where we stand today. In general I think that the patient is making leasing strides towards getting this healed. The issue that she had unfortunately her mother did pass this past week. She was at the wedding yesterday when she actually felt faint coming out of the church there is a lot of emotional situation involved obviously. Nonetheless she somewhat started to pass out her brothers caught her but she did hit her leg laterally underneath the wrap on the side of the steps. This caused a small skin tear and some contusion fortunately this is not too bad but nonetheless I do think that this needs to be monitored currently. 05/26/2021 upon evaluation today patient appears to be doing excellent in regard to her wound on the leg. This is can require some sharp debridement but in general I feel like she is doing very well. The new skin tear that was noted last week is actually healing quite nicely reattached and has done great I think this will probably be pretty much resolved by next week and then should start just even an hour after that. As far as the color of the skin is concerned the bruising will fade is what I mean by that. 06/05/2021 upon evaluation today patient appears to be doing well with regard to her wound. She has been tolerating the dressing changes without complication. Fortunately there does not appear to be any signs of infection the good news is she is actually healing quite nicely and very close to resolution on the anterior wound and the new wound that was on the right last time where she had struck this during almost collapsing at her mom's funeral actually seems to be doing better although there is still a small opening here as well. 06/16/2021 upon evaluation today patient  appears to be doing well with regard to her leg ulcer. She has been tolerating the dressing changes without complication. Fortunately there does not appear to be any signs of active infection at this time. No fevers, chills, nausea, vomiting, or diarrhea. 06/23/2021 upon evaluation today patient appears to be doing decently well in regard to her wounds. In fact everything appears to be completely healed based on what I see currently. Fortunately there does not appear to be any signs of active infection at this time. No fevers, chills, nausea, vomiting, or diarrhea. Electronic Signature(s) Signed: 06/23/2021 8:22:16 AM By: Worthy Keeler PA-C Previous Signature: 06/23/2021 8:21:58 AM Version By: Worthy Keeler PA-C Entered By: Worthy Keeler on 06/23/2021 08:22:15 -------------------------------------------------------------------------------- Physical Exam Details Patient Name: Date of Service: [place], [name] 06/23/2021 8:00 A M Medical Record Number: [id number] Patient Account Number: [id number] Date of Birth/Sex: Treating RN: 01/29/1951 (70 y.o. [name] Riley Primary Care Provider: [name] Other Clinician: Referring Provider: Treating Provider/Extender: Vickii Penna in Treatment: 70 Constitutional Well-nourished and well-hydrated in no acute distress. Respiratory normal breathing without difficulty. Psychiatric this patient is able to make decisions and demonstrates good insight into disease process. Alert and Oriented x 3. pleasant and cooperative. Notes Upon inspection patient's wound bed actually showed signs of good granulation epithelization at this point. Fortunately there does not appear to be any evidence of  active infection which is great news and overall very pleased with where things stand today. No fevers, chills, nausea, vomiting, or diarrhea. Electronic Signature(s) Signed: 06/23/2021 8:22:39 AM By: Worthy Keeler PA-C Entered By: Worthy Keeler on 06/23/2021 08:22:38 -------------------------------------------------------------------------------- Physician Orders Details Patient Name: Date of Service: [place], [name] 06/23/2021 8:00 A M Medical Record Number: [id number] Patient Account Number: [id number] Date of Birth/Sex: Treating RN: July 21, 1951 (70 y.o. 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 Edema Control - Lymphedema / SCD / Other Right Lower Extremity Elevate legs to the level of the heart or above for 30 minutes daily and/or when sitting, a frequency of: - 3-4 times per day. Try to avoid sitting with legs down for long periods of time. Avoid standing for long periods of time. Exercise regularly Moisturize legs daily. - at bedtime after removing stockings Compression stocking or Garment 20-30 mm/Hg pressure to: - both legs daily Additional Orders / Instructions Follow Nutritious Diet Electronic Signature(s) Signed: 06/23/2021 6:00:14 PM By: Worthy Keeler PA-C Signed: 06/23/2021 6:09:03 PM By: Baruch Gouty RN, BSN Entered By: Baruch Gouty on 06/23/2021  08:16:27 -------------------------------------------------------------------------------- Problem List Details Patient Name: Date of Service: [place], [name] 06/23/2021 8:00 A M Medical Record Number: [id number] Patient Account Number: [id number] Date of Birth/Sex: Treating RN: 1951-07-08 (70 y.o. [name] Riley Primary Care Provider: [name] Other Clinician: Referring Provider: Treating Provider/Extender: Vickii Penna in Treatment: 10 Active Problems ICD-10 Encounter Code Description Active Date MDM Diagnosis S80.811A Abrasion, right lower leg, initial encounter 04/14/2021 No Yes I87.2 Venous insufficiency (chronic) (peripheral) 04/14/2021 No Yes L97.812 Non-pressure chronic ulcer of other part of right lower leg with fat layer 04/14/2021 No Yes exposed G90.09 Other idiopathic peripheral autonomic neuropathy 04/14/2021 No Yes C79.81 Secondary malignant neoplasm of breast 04/14/2021 No Yes I10 Essential (primary) hypertension 04/14/2021 No Yes I25.10 Atherosclerotic heart disease of native coronary artery without angina pectoris 04/14/2021 No Yes Z92.21 Personal history of antineoplastic chemotherapy 04/14/2021 No Yes Inactive Problems Resolved Problems Electronic Signature(s) Signed: 06/23/2021 8:12:20 AM By: Worthy Keeler PA-C Entered By: Worthy Keeler on 06/23/2021 08:12:20 -------------------------------------------------------------------------------- Progress Note Details Patient Name: Date of Service: [place], [name] 06/23/2021 8:00 A M Medical Record Number: [id number] Patient Account Number: [id number] Date of Birth/Sex: Treating RN: 1951-05-27 (70 y.o. [name] Riley Primary Care Provider: [name] Other Clinician: Referring Provider: Treating Provider/Extender: Vickii Penna in Treatment: 10 Subjective Chief Complaint Information obtained from Patient Right LE Ulcer History of Present Illness  (HPI) 04/14/2021 upon evaluation today patient presents for initial inspection here in the clinic concerning issues that she has been having with a wound on her right anterior lower leg after she had an abrasion trauma 4 weeks ago. She tells me that unfortunately following this trauma she has been  experiencing quite a bit of discomfort at times with some burning and stinging. She is also had a scab up once or twice but states that right now she has been unable to really get it to scab back over. This has been a frustration for her and she is not really able to get it to heal as quickly and effectively as she would like to see. The patient also tells me that she has been tolerating the dressing changes without complication. She has just been using over-the-counter antibiotic ointment and try to keep it covered with a dry dressing at times depending on what she was told to do. Nonetheless right now she is also been on Keflex that did not really seem to help she was switched over to Augmentin she does started that on Friday. Currently I do not see anything that seems to be obviously infected but nonetheless I think it is fine for her to take the Augmentin just to make sure that we do not get anything that initiates in the interim. The patient is currently undergoing chemotherapy she has had previous radiation. Nonetheless in regard to the chemotherapy this could be affecting her ability to heal to some degree. 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 She has hypertension, neuropathy associated with the chemotherapy, and a history of heart disease. She is currently again undergoing active chemotherapy for recurrent breast cancer. 04/21/2021 upon evaluation today patient appears to be doing well with regard to her leg ulcer. This is actually looking significantly improved. There does not appear to be  any signs of infection visually and I am happy in that regard. Fortunately there is no signs of systemic infection either which is also great news. No fevers, chills, nausea, vomiting, or diarrhea. 05/05/2021 upon evaluation today patient appears to be doing well with regard to her leg ulcer. She has been tolerating the dressing changes without complication. Fortunately there does not appear to be any signs of infection which is great news. No fevers, chills, nausea, vomiting, or diarrhea. 05/12/2021 upon evaluation today patient actually appears to be making good improvement in general. I am extremely pleased with where things stand today. There is no signs of active infection which is also great news. 05/19/2021 upon evaluation today patient appears to be doing well with regard to her wound. This is actually showing signs of excellent granulation epithelization at this point. I do not see any evidence of infection which is great and overall I am extremely pleased with where we stand today. In general I think that the patient is making leasing strides towards getting this healed. The issue that she had unfortunately her mother did pass this past week. She was at the wedding yesterday when she actually felt faint coming out of the church there is a lot of emotional situation involved obviously. Nonetheless she somewhat started to pass out her brothers caught her but she did hit her leg laterally underneath the wrap on the side of the steps. This caused a small skin tear and some contusion fortunately this is not too bad but nonetheless I do think that this needs to be monitored currently. 05/26/2021 upon evaluation today patient appears to be doing excellent in regard to her wound on the leg. This is can require some sharp debridement but in general I feel like she is doing very well. The new skin tear that was noted last  week is actually healing quite nicely reattached and has done great I think this will  probably be pretty much resolved by next week and then should start just even an hour after that. As far as the color of the skin is concerned the bruising will fade is what I mean by that. 06/05/2021 upon evaluation today patient appears to be doing well with regard to her wound. She has been tolerating the dressing changes without complication. Fortunately there does not appear to be any signs of infection the good news is she is actually healing quite nicely and very close to resolution on the anterior wound and the new wound that was on the right last time where she had struck this during almost collapsing at her mom's funeral actually seems to be doing better although there is still a small opening here as well. 06/16/2021 upon evaluation today patient appears to be doing well with regard to her leg ulcer. She has been tolerating the dressing changes without complication. Fortunately there does not appear to be any signs of active infection at this time. No fevers, chills, nausea, vomiting, or diarrhea. 06/23/2021 upon evaluation today patient appears to be doing decently well in regard to her wounds. In fact everything appears to be completely healed based on what I see currently. Fortunately there does not appear to be any signs of active infection at this time. No fevers, chills, nausea, vomiting, or diarrhea. Objective Constitutional Well-nourished and well-hydrated in no acute distress. Vitals Time Taken: 7:56 AM, Height: 68 in, Weight: 157 lbs, BMI: 23.9, Temperature: 98.4 F, Pulse: 78 bpm, Respiratory Rate: 20 breaths/min, Blood Pressure: 114/72 mmHg. Respiratory normal breathing without difficulty. Psychiatric this patient is able to make decisions and demonstrates good insight into disease process. Alert and Oriented x 3. pleasant and cooperative. General Notes: Upon inspection patient's wound bed actually showed signs of good granulation epithelization at this point. Fortunately there  does not appear to be any evidence of active infection which is great news and overall very pleased with where things stand today. No fevers, chills, nausea, vomiting, or diarrhea. Integumentary (Hair, Skin) Wound #1 status is Open. Original cause of wound was Trauma. The date acquired was: 03/17/2021. The wound has been in treatment 10 weeks. The wound is located on the Right,Anterior Lower Leg. The wound measures 0cm length x 0cm width x 0cm depth; 0cm^2 area and 0cm^3 volume. There is no tunneling or undermining noted. There is a none present amount of drainage noted. The wound margin is distinct with the outline attached to the wound base. There is no granulation within the wound bed. There is no necrotic tissue within the wound bed. Wound #2 status is Open. Original cause of wound was Trauma. The date acquired was: 05/15/2021. The wound has been in treatment 5 weeks. The wound is located on the Right,Lateral Lower Leg. The wound measures 0cm length x 0cm width x 0cm depth; 0cm^2 area and 0cm^3 volume. There is no tunneling or undermining noted. There is a none present amount of drainage noted. The wound margin is distinct with the outline attached to the wound base. There is no granulation within the wound bed. There is no necrotic tissue within the wound bed. 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 Edema Control - Lymphedema / SCD /  Other: Elevate legs to the level of the heart or above for 30 minutes daily and/or when sitting, a frequency of: - 3-4 times per day. Try to avoid sitting with legs down for long periods of time. Avoid standing for long periods of time. Exercise regularly Moisturize legs daily. - at bedtime after removing stockings Compression stocking or Garment 20-30 mm/Hg pressure to: - both legs daily Additional Orders / Instructions: Follow Nutritious Diet 1. Would recommend currently that we going continue with the wound care measures as before and the patient is in agreement with the plan. This includes the use of the compression therapy. I do not think the patient is going require any actual wound management as everything does appear to be completely healed. 2. I am also going to recommend that we get the patient continue to elevate her legs when she is sitting I think that is going to be helpful in helping to prevent excessive swelling as well. We will see her back for follow-up visit as needed. Electronic Signature(s) Signed: 06/23/2021 8:23:15 AM By: Worthy Keeler PA-C Entered By: Worthy Keeler on 06/23/2021 08:23:14 -------------------------------------------------------------------------------- SuperBill Details Patient Name: Date of Service: [place], [name] 06/23/2021 Medical Record Number: [id number] Patient Account Number: [id number] Date of Birth/Sex: Treating RN: February 06, 1951 (70 y.o. 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 C79.81 Secondary malignant  neoplasm of breast I10 Essential (primary) hypertension I25.10 Atherosclerotic heart disease of native coronary artery without angina pectoris Z92.21 Personal history of antineoplastic chemotherapy Facility Procedures CPT4 Code: [id number] Description: 99213 - WOUND CARE VISIT-LEV 3 EST PT Modifier: Quantity: 1 Physician Procedures : CPT4 Code Description Modifier [id number] - WC PHYS LEVEL 3 - EST PT ICD-10 Diagnosis Description S80.811A Abrasion, right lower leg, initial encounter I87.2 Venous insufficiency (chronic) (peripheral) L97.812 Non-pressure chronic ulcer of other  part of right lower leg with fat layer exposed G90.09 Other idiopathic peripheral autonomic neuropathy Quantity: 1 Electronic Signature(s) Signed: 06/23/2021 8:23:27 AM By: Worthy Keeler PA-C Entered By: Worthy Keeler on 06/23/2021 [id number]

## 2021-06-23 NOTE — Progress Notes (Signed)
PERLEAN, FLUD ([id number]) Visit Report for 06/23/2021 Arrival Information Details Patient Name: Date of Service: [name] Riley, [name] A. 06/23/2021 8:00 A M Medical Record Number: [id number] Patient Account Number: [id number] Date of Birth/Sex: Treating RN: 08/02/1951 (70 y.o. Helene Shoe, Tammi Klippel Primary Care Shateria Paternostro: [name] Other Clinician: Referring Gustie Bobb: Treating Mohab Ashby/Extender: Vickii Penna in Treatment: 10 Visit Information History Since Last Visit Added or deleted any medications: No Patient Arrived: Ambulatory Any new allergies or adverse reactions: No Arrival Time: 07:55 Had a fall or experienced change in No Accompanied By: self activities of daily living that may affect Transfer Assistance: None risk of falls: Patient Identification Verified: Yes Signs or symptoms of abuse/neglect since last visito No Secondary Verification Process Completed: Yes Hospitalized since last visit: No Patient Requires Transmission-Based Precautions: No Implantable device outside of the clinic excluding No Patient Has Alerts: No cellular tissue based products placed in the center since last visit: Has Dressing in Place as Prescribed: Yes Has Compression in Place as Prescribed: Yes Pain Present Now: No Electronic Signature(s) Signed: 06/23/2021 5:29:03 PM By: Deon Pilling Entered By: Deon Pilling on 06/23/2021 08:01:22 -------------------------------------------------------------------------------- Clinic Level of Care Assessment Details Patient Name: Date of Service: [name] Riley, [name] 06/23/2021 8:00 A M Medical Record Number: [id number] Patient Account Number: [id number] Date of Birth/Sex: Treating RN: November 26, 1950 (70 y.o. [name] Riley Primary Care Jaianna Nicoll: [name] Other Clinician: Referring Leena Tiede: Treating Rene Gonsoulin/Extender: Vickii Penna in Treatment: 10 Clinic Level of Care Assessment Items TOOL 4 Quantity  Score '[]'$  - 0 Use when only an EandM is performed on FOLLOW-UP visit ASSESSMENTS - Nursing Assessment / Reassessment X- 1 10 Reassessment of Co-morbidities (includes updates in patient status) X- 1 5 Reassessment of Adherence to Treatment Plan ASSESSMENTS - Wound and Skin A ssessment / Reassessment '[]'$  - 0 Simple Wound Assessment / Reassessment - one wound X- 2 5 Complex Wound Assessment / Reassessment - multiple wounds '[]'$  - 0 Dermatologic / Skin Assessment (not related to wound area) ASSESSMENTS - Focused Assessment X- 1 5 Circumferential Edema Measurements - multi extremities '[]'$  - 0 Nutritional Assessment / Counseling / Intervention X- 1 5 Lower Extremity Assessment (monofilament, tuning fork, pulses) '[]'$  - 0 Peripheral Arterial Disease Assessment (using hand held doppler) ASSESSMENTS - Ostomy and/or Continence Assessment and Care '[]'$  - 0 Incontinence Assessment and Management '[]'$  - 0 Ostomy Care Assessment and Management (repouching, etc.) PROCESS - Coordination of Care X - Simple Patient / Family Education for ongoing care 1 15 '[]'$  - 0 Complex (extensive) Patient / Family Education for ongoing care X- 1 10 Staff obtains Programmer, systems, Records, T Results / Process Orders est '[]'$  - 0 Staff telephones HHA, Nursing Homes / Clarify orders / etc '[]'$  - 0 Routine Transfer to another Facility (non-emergent condition) '[]'$  - 0 Routine Hospital Admission (non-emergent condition) '[]'$  - 0 New Admissions / Biomedical engineer / Ordering NPWT Apligraf, etc. , '[]'$  - 0 Emergency Hospital Admission (emergent condition) X- 1 10 Simple Discharge Coordination '[]'$  - 0 Complex (extensive) Discharge Coordination PROCESS - Special Needs '[]'$  - 0 Pediatric / Minor Patient Management '[]'$  - 0 Isolation Patient Management '[]'$  - 0 Hearing / Language / Visual special needs '[]'$  - 0 Assessment of Community assistance (transportation, D/C planning, etc.) '[]'$  - 0 Additional assistance / Altered  mentation '[]'$  - 0 Support Surface(s) Assessment (bed, cushion, seat, etc.) INTERVENTIONS - Wound Cleansing / Measurement '[]'$  - 0 Simple Wound Cleansing - one wound  X- 2 5 Complex Wound Cleansing - multiple wounds X- 1 5 Wound Imaging (photographs - any number of wounds) '[]'$  - 0 Wound Tracing (instead of photographs) '[]'$  - 0 Simple Wound Measurement - one wound '[]'$  - 0 Complex Wound Measurement - multiple wounds INTERVENTIONS - Wound Dressings '[]'$  - 0 Small Wound Dressing one or multiple wounds '[]'$  - 0 Medium Wound Dressing one or multiple wounds '[]'$  - 0 Large Wound Dressing one or multiple wounds '[]'$  - 0 Application of Medications - topical '[]'$  - 0 Application of Medications - injection INTERVENTIONS - Miscellaneous '[]'$  - 0 External ear exam '[]'$  - 0 Specimen Collection (cultures, biopsies, blood, body fluids, etc.) '[]'$  - 0 Specimen(s) / Culture(s) sent or taken to Lab for analysis '[]'$  - 0 Patient Transfer (multiple staff / Civil Service fast streamer / Similar devices) '[]'$  - 0 Simple Staple / Suture removal (25 or less) '[]'$  - 0 Complex Staple / Suture removal (26 or more) '[]'$  - 0 Hypo / Hyperglycemic Management (close monitor of Blood Glucose) '[]'$  - 0 Ankle / Brachial Index (ABI) - do not check if billed separately X- 1 5 Vital Signs Has the patient been seen at the hospital within the last three years: Yes Total Score: 90 Level Of Care: New/Established - Level 3 Electronic Signature(s) Signed: 06/23/2021 6:09:03 PM By: Baruch Gouty RN, BSN Entered By: Baruch Gouty on 06/23/2021 08:17:06 -------------------------------------------------------------------------------- Encounter Discharge Information Details Patient Name: Date of Service: [name], [name] 06/23/2021 8:00 A M Medical Record Number: [id number] Patient Account Number: [id number] Date of Birth/Sex: Treating RN: [birth year] (70 y.o. [name] Riley Primary Care [name] Riley: [name] Other Clinician: Referring  Dearies Meikle: Treating Danica Camarena/Extender: Vickii Penna in Treatment: 10 Encounter Discharge Information Items Discharge Condition: Stable Ambulatory Status: Ambulatory Discharge Destination: Home Transportation: Private Auto Accompanied By: self Schedule Follow-up Appointment: Yes Clinical Summary of Care: Patient Declined Electronic Signature(s) Signed: 06/23/2021 6:09:03 PM By: Baruch Gouty RN, BSN Entered By: Baruch Gouty on 06/23/2021 08:18:21 -------------------------------------------------------------------------------- Lower Extremity Assessment Details Patient Name: Date of Service: [name] Riley, [name] 06/23/2021 8:00 A M Medical Record Number: [id number] Patient Account Number: [id number] Date of Birth/Sex: Treating RN: 04-Jun-1951 (70 y.o. [name] Primary Care Genell Thede: [name] Other Clinician: Referring Heena Woodbury: Treating Tiffny Gemmer/Extender: Vickii Penna in Treatment: 10 Edema Assessment Assessed: [Left: No] [Right: Yes] Edema: [Left: N] [Right: o] Calf Left: Right: Point of Measurement: 29 cm From Medial Instep 32 cm Ankle Left: Right: Point of Measurement: 10 cm From Medial Instep 21 cm Vascular Assessment Pulses: Dorsalis Pedis Palpable: [Right:Yes] Electronic Signature(s) Signed: 06/23/2021 5:29:03 PM By: Deon Pilling Entered By: Deon Pilling on 06/23/2021 08:02:04 -------------------------------------------------------------------------------- [place] Details Patient Name: Date of Service: [place], [name] 06/23/2021 8:00 A M Medical Record Number: [id number] Patient Account Number: [id number] Date of Birth/Sex: Treating RN: 1951/04/22 (70 y.o. [name] Riley Primary Care Evelyn Aguinaldo: [name] Other Clinician: Referring Loyd Marhefka: Treating Achille Xiang/Extender: Vickii Penna in Treatment: [place] reviewed with  physician Active Inactive Electronic Signature(s) Signed: 06/23/2021 6:09:03 PM By: Baruch Gouty RN, BSN Entered By: Baruch Gouty on 06/23/2021 08:11:58 -------------------------------------------------------------------------------- Pain Assessment Details Patient Name: Date of Service: [place], [name] 06/23/2021 8:00 A M Medical Record Number: [id number] Patient Account Number: [id number] Date of Birth/Sex: Treating RN: Sep 03, 1951 (70 y.o. [name] Primary Care Antino Mayabb: [name] Other Clinician: Referring Brenn Deziel: Treating Emerita Berkemeier/Extender: Trellis Moment,  Junie Spencer in Treatment: 10 Active Problems Location of Pain Severity and Description of Pain Patient Has Paino No Site Locations Rate the pain. Rate the pain. Current Pain Level: 0 Pain Management and Medication Current Pain Management: Medication: No Cold Application: No Rest: No Massage: No Activity: No T.E.N.S.: No Heat Application: No Leg drop or elevation: No Is the Current Pain Management Adequate: Adequate How does your wound impact your activities of daily livingo Sleep: No Bathing: No Appetite: No Relationship With Others: No Bladder Continence: No Emotions: No Bowel Continence: No Work: No Toileting: No Drive: No Dressing: No Hobbies: No Electronic Signature(s) Signed: 06/23/2021 5:29:03 PM By: Deon Pilling Entered By: Deon Pilling on 06/23/2021 08:01:50 -------------------------------------------------------------------------------- Patient/Caregiver Education Details Patient Name: Date of Service: Erskin Burnet, [name] 9/7/2022andnbsp8:00 [place] Record Number: [id number] Patient Account Number: [id number] Date of Birth/Gender: Treating RN: 1951/06/23 (70 y.o. [name] Riley Primary Care Physician: [name] Other Clinician: Referring Physician: Treating Physician/Extender: Vickii Penna in Treatment: 10 Education  Assessment Education Provided To: Patient Education Topics Provided Venous: Methods: Explain/Verbal Responses: Reinforcements needed, State content correctly Wound/Skin Impairment: Methods: Explain/Verbal Responses: Reinforcements needed, State content correctly Electronic Signature(s) Signed: 06/23/2021 6:09:03 PM By: Baruch Gouty RN, BSN Signed: 06/23/2021 6:09:03 PM By: Baruch Gouty RN, BSN Entered By: Baruch Gouty on 06/23/2021 08:12:20 -------------------------------------------------------------------------------- Wound Assessment Details Patient Name: Date of Service: [name] Riley, [name] 06/23/2021 8:00 A M Medical Record Number: [id number] Patient Account Number: [id number] Date of Birth/Sex: Treating RN: 07-15-1951 (70 y.o. Helene Shoe, Tammi Klippel Primary Care Ketan Renz: [name] Other Clinician: Referring Khaliq Turay: Treating Aftyn Nott/Extender: Vickii Penna in Treatment: 10 Wound Status Wound Number: 1 Primary Trauma, Other Etiology: Wound Location: Right, Anterior Lower Leg Wound Open Wounding Event: Trauma Status: Date Acquired: 03/17/2021 Comorbid Coronary Artery Disease, Hypertension, Myocardial Infarction, Weeks Of Treatment: 10 History: Neuropathy, Received Chemotherapy, Received Radiation Clustered Wound: No Photos Wound Measurements Length: (cm) Width: (cm) Depth: (cm) Area: (cm) Volume: (cm) 0 % Reduction in Area: 100% 0 % Reduction in Volume: 100% 0 Epithelialization: Large (67-100%) 0 Tunneling: No 0 Undermining: No Wound Description Classification: Full Thickness Without Exposed Support Structures Wound Margin: Distinct, outline attached Exudate Amount: None Present Foul Odor After Cleansing: No Slough/Fibrino No Wound Bed Granulation Amount: None Present (0%) Exposed Structure Necrotic Amount: None Present (0%) Fascia Exposed: No Fat Layer (Subcutaneous Tissue) Exposed: No Tendon Exposed: No Muscle Exposed:  No Joint Exposed: No Bone Exposed: No Electronic Signature(s) Signed: 06/23/2021 5:29:03 PM By: Deon Pilling Entered By: Deon Pilling on 06/23/2021 08:03:25 -------------------------------------------------------------------------------- Wound Assessment Details Patient Name: Date of Service: [name] Riley, [name] 06/23/2021 8:00 A M Medical Record Number: [id number] Patient Account Number: [id number] Date of Birth/Sex: Treating RN: 08/28/1951 (70 y.o. Helene Shoe, Tammi Klippel Primary Care Claudio Mondry: [name] Other Clinician: Referring Annaleigha Woo: Treating Macky Galik/Extender: Vickii Penna in Treatment: 10 Wound Status Wound Number: 2 Primary Trauma, Other Etiology: Wound Location: Right, Lateral Lower Leg Wound Open Wounding Event: Trauma Status: Date Acquired: 05/15/2021 Comorbid Coronary Artery Disease, Hypertension, Myocardial Infarction, Weeks Of Treatment: 5 History: Neuropathy, Received Chemotherapy, Received Radiation Clustered Wound: No Photos Wound Measurements Length: (cm) Width: (cm) Depth: (cm) Area: (cm) Volume: (cm) 0 % Reduction in Area: 100% 0 % Reduction in Volume: 100% 0 Epithelialization: Large (67-100%) 0 Tunneling: No 0 Undermining: No Wound Description Classification: Full Thickness Without Exposed Support Structures Wound Margin: Distinct, outline attached Exudate Amount: None Present Foul  Odor After Cleansing: No Slough/Fibrino No Wound Bed Granulation Amount: None Present (0%) Exposed Structure Necrotic Amount: None Present (0%) Fascia Exposed: No Fat Layer (Subcutaneous Tissue) Exposed: No Tendon Exposed: No Muscle Exposed: No Joint Exposed: No Bone Exposed: No Electronic Signature(s) Signed: 06/23/2021 5:29:03 PM By: Deon Pilling Entered By: Deon Pilling on 06/23/2021 08:03:50 -------------------------------------------------------------------------------- Vitals Details Patient Name: Date of Service: [name] Riley, [name]  RO L A. 06/23/2021 8:00 A M Medical Record Number: [id number] Patient Account Number: [id number] Date of Birth/Sex: Treating RN: 1951-02-08 (70 y.o. Helene Shoe, Tammi Klippel Primary Care Lakeena Downie: [name] Other Clinician: Referring Denys Salinger: Treating Tatem Fesler/Extender: Vickii Penna in Treatment: 10 Vital Signs Time Taken: 07:56 Temperature (F): 98.4 Height (in): 68 Pulse (bpm): 78 Weight (lbs): 157 Respiratory Rate (breaths/min): 20 Body Mass Index (BMI): 23.9 Blood Pressure (mmHg): 114/72 Reference Range: 80 - 120 mg / dl Electronic Signature(s) Signed: 06/23/2021 5:29:03 PM By: Deon Pilling Entered By: Deon Pilling on 06/23/2021 08:01:41

## 2021-06-24 ENCOUNTER — Other Ambulatory Visit (HOSPITAL_COMMUNITY): Payer: Self-pay

## 2021-06-29 ENCOUNTER — Other Ambulatory Visit (HOSPITAL_COMMUNITY): Payer: Self-pay

## 2021-07-01 NOTE — Progress Notes (Signed)
Patient Care Team: [name], MD as PCP - General (Family Medicine) Minus Breeding, MD as PCP - Cardiology (Cardiology) Nicholas Lose, MD as Consulting Physician (Hematology and Oncology)  DIAGNOSIS:    ICD-10-CM   1. Malignant neoplasm of upper-outer quadrant of left breast in female, estrogen receptor positive ([place])  C50.412 abemaciclib (VERZENIO) 50 MG tablet   Z17.0       SUMMARY OF ONCOLOGIC HISTORY: Oncology History  Breast cancer of upper-outer quadrant of left female breast ([place])  11/14/2011 Surgery   Bilateral mastectomy, prophylactic on the right, left breast IDC 3/18 lymph nodes positive with extracapsular extension ER 89%, PR 81%, HER-2 negative, Ki-67 79% T2 N1 A. stage IIB   12/13/2011 - 06/28/2012 Chemotherapy   4 cycles of FEC followed by 4 cycles of Taxotere   07/17/2012 - 08/22/2012 Radiation Therapy   Adjuvant radiation therapy   08/22/2012 - 03/16/2016 Anti-estrogen oral therapy   Arimidex 1 mg daily   03/16/2016 Relapse/Recurrence   Subcutaneous nodule excision left chest: Infiltrating carcinoma breast primary, ER positive, PR negative   03/29/2016 Imaging   CT CAP and bone scan: Lytic lesions T8 vertebral, T1 posterior element, subcutaneous nodule left lateral chest wall, nonspecific lung nodules; Bone scan: Mets to kull, left humerus, left eighth rib, T7/T8, sternum, left acetabulum   04/28/2016 - 06/17/2017 Chemotherapy   Herceptin, lapatinib, Faslodex, Zometa every 4 weeks, lapatinib discontinued in September 2018 due to elevation of LFTs   06/22/2017 Relapse/Recurrence   Surgical excision:Soft tissue mass left lateral chest wall primary breast cancer, soft tissue mass left medial chest wall breast cancer, tumor is within the dermis extending to the subcutaneous adipose tissue and involves portions of skeletal muscle   06/22/2017 Cancer Staging   Staging form: Breast, AJCC 7th Edition - Pathologic stage from 06/22/2017: Stage IV (TX, NX, M1) - Signed by  Nicholas Lose, MD on 12/27/2019   08/2017 - 02/16/2018 Chemotherapy   Faslodex with Herceptin and Perjeta along with Zometa every 4 weeks   03/02/2018 - 05/25/2018 Chemotherapy   Kadcyla   05/11/2018 Imaging   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 Associated vasogenic edema within the underlying right cerebral hemisphere without significant midline shift. Signal abnormality throughout the visualized bone marrow, compatible with osseous metastatic disease.    06/04/2018 Imaging   CT CAP: Right lower lobe lung nodule 7 mm (was 5 mm); multiple bone metastases throughout the spine and ribs sternum scapula and humerus, slightly increased lower thoracic mets, right renal lesion 2.5 cm (was 1.2 cm) right femur met increased from 2.1 cm to 2.9 cm   06/15/2018 -  Anti-estrogen oral therapy   Abemaciclib, Herceptin, letrozole, Xgeva   06/07/2019 Imaging   Progression of brain metastases,2 discrete dural-based lesions involving the posterior right frontal lobe. The more anterior lesion now measures 16.5 x 17.5 x 14 mm. The posterior lesion now measures 9.5 x 13 x 20 mm.  Vasogenic edema of the frontal and parietal lobes   06/21/2019 - 06/27/2019 Radiation Therapy   SRS to the brain   07/19/2020 Relapse/Recurrence   Left mastectomy scar nodule: Biopsy invasive carcinoma ER 60%, PR 0%, HER-2 negative by [facility]   07/29/2020 Surgery   Soft tissue mass left chest wall excision: No evidence of malignancy.   11/27/2020 - 02/05/2021 Chemotherapy      Patient is on Antibody Plan: BREAST ADO-TRASTUZUMAB EMTANSINE ([place]) Q21D     01/15/2021 -  Chemotherapy      Patient is on Antibody Plan: BREAST  ADO-TRASTUZUMAB EMTANSINE [facility]) Q21D     Metastatic breast cancer ([place])  06/29/2017 Initial Diagnosis   Metastatic breast cancer ([place])   06/15/2018 - 12/25/2020 Chemotherapy    Patient is on Treatment Plan: BRAIN GBM BEVACIZUMAB 14D X 6 CYCLES       01/15/2021 -  Chemotherapy       Patient is on Antibody Plan: BREAST ADO-TRASTUZUMAB EMTANSINE ([place]) Q21D     Brain metastasis ([place])  07/13/2018 Initial Diagnosis   Brain metastasis ([place])   11/27/2020 - 02/05/2021 Chemotherapy      Patient is on Antibody Plan: BREAST ADO-TRASTUZUMAB EMTANSINE (KADCYLA) Q21D       CHIEF COMPLIANT: Cycle 9 Kadcyla  INTERVAL HISTORY: [name] Riley is a 70 y.o. with above-mentioned history of metastatic breast cancer currently on abemaciclib with letrozole along with Herceptin, Avastin, and Xgeva. She presents to the clinic today for cycle 9 of Kadcyla.   ALLERGIES:  is allergic to cantaloupe extract allergy skin test, contrast media [iodinated diagnostic agents], pravastatin, zosyn [piperacillin sod-tazobactam so], and latex.  MEDICATIONS:  Current Outpatient Medications  Medication Sig Dispense Refill   abemaciclib (VERZENIO) 50 MG tablet Take 1 tablet (50 mg total) by mouth 2 (two) times daily. 56 tablet 3   acetaminophen (TYLENOL) 500 MG tablet Take 1,000 mg by mouth every 6 (six) hours as needed for moderate pain.     Ascorbic Acid (VITAMIN C PO) Take by mouth.     cholecalciferol (VITAMIN D) 1000 units tablet Take 1 tablet (1,000 Units total) by mouth daily.     Docusate Sodium (DSS) 100 MG CAPS Take by mouth.     furosemide (LASIX) 20 MG tablet TAKE ONE (1) TABLET BY MOUTH EVERY DAY 30 tablet 1   letrozole (FEMARA) 2.5 MG tablet TAKE ONE (1) TABLET EACH DAY 90 tablet 3   levETIRAcetam (KEPPRA) 500 MG tablet TAKE ONE TABLET BY MOUTH TWICE DAILY 60 tablet 3   levothyroxine (SYNTHROID) 100 MCG tablet TAKE ONE TABLET EACH MORNING BEFORE BREAKFAST 90 tablet 2   lidocaine-prilocaine (EMLA) cream APPLY TOPICALLY AS NEEDED FOR PORT ACCESS 30 g 3   metoprolol succinate (TOPROL-XL) 100 MG 24 hr tablet TAKE ONE (1) TABLET EACH DAY 90 tablet 0   Multiple Vitamins-Minerals (MULTIVITAMIN WITH MINERALS) tablet Take 1 tablet by mouth daily.     ondansetron (ZOFRAN-ODT) 4 MG  disintegrating tablet TAKE 1 TABLET EVERY 6 HOURS AS NEEDED FOR NAUSEA AND VOMITING 30 tablet 3   potassium chloride SA (KLOR-CON) 20 MEQ tablet Take 1 tablet (20 mEq total) by mouth 2 (two) times daily for 7 days. 14 tablet 0   predniSONE (DELTASONE) 50 MG tablet Patient to take 13 hours, 7 hours, and 1 hour prior to CT scan (Patient not taking: Reported on 06/10/2021) 3 tablet 0   rosuvastatin (CRESTOR) 5 MG tablet Take 1 tablet (5 mg total) by mouth daily. (new directions) 90 tablet 1   sulfamethoxazole-trimethoprim (BACTRIM DS) 800-160 MG tablet Take 1 tablet by mouth 2 (two) times daily. 10 tablet 0   triamcinolone (KENALOG) 0.025 % ointment Apply 1 application topically 2 (two) times daily. 80 g 1   valsartan (DIOVAN) 80 MG tablet TAKE ONE (1) TABLET EACH DAY 90 tablet 1   No current facility-administered medications for this visit.   Facility-Administered Medications Ordered in Other Visits  Medication Dose Route Frequency Provider Last Rate Last Admin   sodium chloride flush (NS) 0.9 % injection 10 mL  10 mL Intracatheter PRN Nicholas Lose, MD  10 mL at 05/03/21 1653    PHYSICAL EXAMINATION: ECOG PERFORMANCE STATUS: 1 - Symptomatic but completely ambulatory  Vitals:   07/02/21 1022  BP: (!) 146/79  Pulse: 81  Resp: 19  Temp: (!) 97.5 F (36.4 C)  SpO2: 100%   Filed Weights   07/02/21 1022  Weight: 138 lb 1.6 oz (62.6 kg)    LABORATORY DATA:  I have reviewed the data as listed CMP Latest Ref Rng & Units 06/11/2021 05/21/2021 05/03/2021  Glucose 70 - 99 mg/dL 108(H) 102(H) 89  BUN 8 - 23 mg/dL 13 12 12   Creatinine 0.44 - 1.00 mg/dL 0.83 0.82 0.71  Sodium 135 - 145 mmol/L 138 138 140  Potassium 3.5 - 5.1 mmol/L 3.1(L) 3.2(L) 3.2(L)  Chloride 98 - 111 mmol/L 101 101 101  CO2 22 - 32 mmol/L 28 26 31   Calcium 8.9 - 10.3 mg/dL 9.4 9.4 9.5  Total Protein 6.5 - 8.1 g/dL 6.5 6.6 6.3(L)  Total Bilirubin 0.3 - 1.2 mg/dL 1.0 1.0 1.2  Alkaline Phos 38 - 126 U/L 236(H) 266(H) 181(H)   AST 15 - 41 U/L 53(H) 67(H) 49(H)  ALT 0 - 44 U/L 21 26 18     Lab Results  Component Value Date   WBC 3.3 (L) 07/02/2021   HGB 10.5 (L) 07/02/2021   HCT 31.5 (L) 07/02/2021   MCV 109.8 (H) 07/02/2021   PLT 101 (L) 07/02/2021   NEUTROABS PENDING 07/02/2021    ASSESSMENT & PLAN:  Breast cancer of upper-outer quadrant of left female breast ([place]) Current treatment:  Abemaciclib with letrozole along with Herceptin (to be given every 4 weeks) and Xgeva started 06/15/2018   CT CAP 05/25/2020: No significant interval change in the bone metastases. 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 Mickeal Skinner is adding Avastin to the treatment. Today is cycle 1   CTCAP 11/26/2020: New hepatic lesions worrisome for hepatic mets (1.6 cm, 1.2 cm, 2.3 cm)   Recommendation: 1. Biopsy of the liver lesion.  12/11/2020: Metastatic carcinoma, ER 75% week to moderate, PR 0%, Ki-67 25%, HER-2 equivocal by IHC, FISH positive ratio 2.97, copy #5.2 2. Change treatment plan: Kadcyla along with abemaciclib and letrozole ---------------------------------------------------------------------------------------------------------------------  Current Treatment: Kadcyla q [redacted] weeks along with Verzenio and letrozole. (she completed bevacizumab x4)   Kadcyla toxicities:  1.  Fatigue as a result of chemotherapy: Stable 2. chemo induced anemia: Monitoring closely, today's hemoglobin is 11.3 3. peripheral neuropathy: Because of worsening peripheral neuropathy, we will infuse Kadcyla every 4 weeks and also change Verzenio dosage to 50 mg twice daily for next month onwards. 4.  Emergency room visit for leg discomfort: Hypokalemic sent home with potassium replacement therapy 5.  Leukopenia: Related to Verzenio, stable 6.  Thrombocytopenia: Platelets 101    Nonhealing wound on the right leg: Wound care is assisting her Easy bruising on the hands: Possibly due to thrombocytopenia   Abemaciclib toxicities: denies diarrhea, Complains of more fatigue Because of neuropathy reduce the dosage of Verzenio to 50 mg p.o. twice daily.  This will start in October 2022.   CT CAP 04/02/21: Unchanged  Hepatic and osseous disease, next scans will be done in December 2022 MRI brain 06/04/2021: Stable, no interval change in the ill-defined areas of enhancement in the right frontal and parietal lobes, unchanged right cerebral hemisphere and unchanged dural  based nodular enhancement over right anterior temporal lobe, unchanged bone metastases  Return to clinic every 4 weeks for Kadcyla    No orders of the defined types were placed in this encounter.  The patient has a good understanding of the overall plan. she agrees with it. she will call with any problems that may develop before the next visit here.  Total time spent: 30 mins including face to face time and time spent for planning, charting and coordination of care  Rulon Eisenmenger, MD, MPH 07/02/2021  I, Thana Ates, am acting as scribe for Dr. Nicholas Lose.  I have reviewed the above documentation for accuracy and completeness, and I agree with the above.

## 2021-07-02 ENCOUNTER — Inpatient Hospital Stay: Payer: Medicare Other | Attending: Hematology and Oncology

## 2021-07-02 ENCOUNTER — Inpatient Hospital Stay: Payer: Medicare Other

## 2021-07-02 ENCOUNTER — Other Ambulatory Visit: Payer: Self-pay

## 2021-07-02 ENCOUNTER — Other Ambulatory Visit (HOSPITAL_COMMUNITY): Payer: Self-pay

## 2021-07-02 ENCOUNTER — Inpatient Hospital Stay (HOSPITAL_BASED_OUTPATIENT_CLINIC_OR_DEPARTMENT_OTHER): Payer: Medicare Other | Admitting: Hematology and Oncology

## 2021-07-02 DIAGNOSIS — Z5111 Encounter for antineoplastic chemotherapy: Secondary | ICD-10-CM | POA: Insufficient documentation

## 2021-07-02 DIAGNOSIS — Z79811 Long term (current) use of aromatase inhibitors: Secondary | ICD-10-CM | POA: Diagnosis not present

## 2021-07-02 DIAGNOSIS — Z79899 Other long term (current) drug therapy: Secondary | ICD-10-CM | POA: Diagnosis not present

## 2021-07-02 DIAGNOSIS — C50412 Malignant neoplasm of upper-outer quadrant of left female breast: Secondary | ICD-10-CM

## 2021-07-02 DIAGNOSIS — C50919 Malignant neoplasm of unspecified site of unspecified female breast: Secondary | ICD-10-CM

## 2021-07-02 DIAGNOSIS — D6481 Anemia due to antineoplastic chemotherapy: Secondary | ICD-10-CM | POA: Insufficient documentation

## 2021-07-02 DIAGNOSIS — D696 Thrombocytopenia, unspecified: Secondary | ICD-10-CM | POA: Diagnosis not present

## 2021-07-02 DIAGNOSIS — Z95828 Presence of other vascular implants and grafts: Secondary | ICD-10-CM

## 2021-07-02 DIAGNOSIS — Z5112 Encounter for antineoplastic immunotherapy: Secondary | ICD-10-CM | POA: Insufficient documentation

## 2021-07-02 DIAGNOSIS — C7931 Secondary malignant neoplasm of brain: Secondary | ICD-10-CM | POA: Insufficient documentation

## 2021-07-02 DIAGNOSIS — Z170 Estrogen receptor positive status [ER+]: Secondary | ICD-10-CM

## 2021-07-02 DIAGNOSIS — C7951 Secondary malignant neoplasm of bone: Secondary | ICD-10-CM | POA: Diagnosis not present

## 2021-07-02 DIAGNOSIS — G629 Polyneuropathy, unspecified: Secondary | ICD-10-CM | POA: Diagnosis not present

## 2021-07-02 DIAGNOSIS — D72819 Decreased white blood cell count, unspecified: Secondary | ICD-10-CM | POA: Diagnosis not present

## 2021-07-02 LAB — CMP (CANCER CENTER ONLY)
ALT: 25 U/L (ref 0–44)
AST: 62 U/L — ABNORMAL HIGH (ref 15–41)
Albumin: 3.4 g/dL — ABNORMAL LOW (ref 3.5–5.0)
Alkaline Phosphatase: 253 U/L — ABNORMAL HIGH (ref 38–126)
Anion gap: 9 (ref 5–15)
BUN: 14 mg/dL (ref 8–23)
CO2: 27 mmol/L (ref 22–32)
Calcium: 9.5 mg/dL (ref 8.9–10.3)
Chloride: 101 mmol/L (ref 98–111)
Creatinine: 0.75 mg/dL (ref 0.44–1.00)
GFR, Estimated: >60 mL/min (ref >60)
Glucose, Bld: 93 mg/dL (ref 70–99)
Potassium: 3.3 mmol/L — ABNORMAL LOW (ref 3.5–5.1)
Sodium: 137 mmol/L (ref 135–145)
Total Bilirubin: 1 mg/dL (ref 0.3–1.2)
Total Protein: 6.5 g/dL (ref 6.5–8.1)

## 2021-07-02 LAB — CBC WITH DIFFERENTIAL (CANCER CENTER ONLY)
Abs Immature Granulocytes: 0.01 10*3/uL (ref 0.00–0.07)
Basophils Absolute: 0.1 10*3/uL (ref 0.0–0.1)
Basophils Relative: 2 %
Eosinophils Absolute: 0.1 10*3/uL (ref 0.0–0.5)
Eosinophils Relative: 2 %
HCT: 31.5 % — ABNORMAL LOW (ref 36.0–46.0)
Hemoglobin: 10.5 g/dL — ABNORMAL LOW (ref 12.0–15.0)
Immature Granulocytes: 0 %
Lymphocytes Relative: 44 %
Lymphs Abs: 1.5 10*3/uL (ref 0.7–4.0)
MCH: 36.6 pg — ABNORMAL HIGH (ref 26.0–34.0)
MCHC: 33.3 g/dL (ref 30.0–36.0)
MCV: 109.8 fL — ABNORMAL HIGH (ref 80.0–100.0)
Monocytes Absolute: 0.4 10*3/uL (ref 0.1–1.0)
Monocytes Relative: 13 %
Neutro Abs: 1.3 10*3/uL — ABNORMAL LOW (ref 1.7–7.7)
Neutrophils Relative %: 39 %
Platelet Count: 101 10*3/uL — ABNORMAL LOW (ref 150–400)
RBC: 2.87 MIL/uL — ABNORMAL LOW (ref 3.87–5.11)
RDW: 15.8 % — ABNORMAL HIGH (ref 11.5–15.5)
WBC Count: 3.3 10*3/uL — ABNORMAL LOW (ref 4.0–10.5)
nRBC: 0 % (ref 0.0–0.2)

## 2021-07-02 MED ORDER — DIPHENHYDRAMINE HCL 25 MG PO CAPS
50.0000 mg | ORAL_CAPSULE | Freq: Once | ORAL | Status: AC
  Administered 2021-07-02: 50 mg via ORAL
  Filled 2021-07-02: qty 2

## 2021-07-02 MED ORDER — HEPARIN SOD (PORK) LOCK FLUSH 100 UNIT/ML IV SOLN
500.0000 [IU] | Freq: Once | INTRAVENOUS | Status: AC | PRN
  Administered 2021-07-02: 500 [IU]

## 2021-07-02 MED ORDER — ACETAMINOPHEN 325 MG PO TABS
650.0000 mg | ORAL_TABLET | Freq: Once | ORAL | Status: AC
  Administered 2021-07-02: 650 mg via ORAL
  Filled 2021-07-02: qty 2

## 2021-07-02 MED ORDER — SODIUM CHLORIDE 0.9 % IV SOLN
3.0000 mg/kg | Freq: Once | INTRAVENOUS | Status: AC
  Administered 2021-07-02: 200 mg via INTRAVENOUS
  Filled 2021-07-02: qty 10

## 2021-07-02 MED ORDER — ABEMACICLIB 50 MG PO TABS
50.0000 mg | ORAL_TABLET | Freq: Two times a day (BID) | ORAL | 3 refills | Status: DC
  Filled 2021-07-02 (×2): qty 56, 28d supply, fill #0
  Filled 2021-07-26: qty 56, 28d supply, fill #1
  Filled 2021-08-27: qty 56, 28d supply, fill #2
  Filled 2021-09-22: qty 56, 28d supply, fill #3

## 2021-07-02 MED ORDER — SODIUM CHLORIDE 0.9% FLUSH
10.0000 mL | INTRAVENOUS | Status: DC | PRN
  Administered 2021-07-02: 10 mL

## 2021-07-02 MED ORDER — SODIUM CHLORIDE 0.9% FLUSH
10.0000 mL | Freq: Once | INTRAVENOUS | Status: AC
  Administered 2021-07-02: 10 mL

## 2021-07-02 MED ORDER — SODIUM CHLORIDE 0.9 % IV SOLN: Freq: Once | INTRAVENOUS | Status: AC

## 2021-07-02 NOTE — Assessment & Plan Note (Signed)
Current treatment: Abemaciclibwith letrozole along with Herceptin(to be given every 4 weeks)and Xgeva started 06/15/2018  CT CAP 05/25/2020: No significant interval change in the bone metastases. 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 Mickeal Skinner is adding Avastin to the treatment.Today is cycle 1  CTCAP2/07/2021: New hepatic lesions worrisome for hepatic mets (1.6 cm, 1.2 cm, 2.3 cm)  Recommendation: 1. Biopsy of the liver lesion.12/11/2020: Metastatic carcinoma, ER 75% week to moderate, PR 0%, Ki-67 25%, HER-2 equivocal by IHC, FISH positive ratio 2.97, copy #5.2 2. Change treatment plan:Kadcylaalong withabemaciclib and letrozole --------------------------------------------------------------------------------------------------------------------- Current Treatment:Kadcyla q [redacted] weeksalong with Verzenioand letrozole. Today is cycle8(she completed bevacizumab x4)  Kadcyla toxicities: 1.Fatigue as a result of chemotherapy: Stable 2.chemo induced anemia: Monitoring closely, today's hemoglobin is 11.3 3.peripheral neuropathy 4.Emergency room visit for leg discomfort: Hypokalemic sent home with potassium replacement therapy 5.Leukopenia: Related to Verzenio, stable 6.  Thrombocytopenia: Platelets 86: We will decrease the dosage of Kadcyla today okay to treat.  Nonhealing wound on the right CBI:PJRPZ care is assisting her Easy bruising on the hands: Possibly due to thrombocytopenia  Abemaciclib toxicities:denies diarrhea, Complains of more fatigue  CT CAP 04/02/21: Unchanged Hepatic and osseous disease, next scans will be done in December 2022 MRI brain 06/04/2021: Stable, no interval change in the ill-defined areas of enhancement in the right frontal and parietal lobes, unchanged right  cerebral hemisphere and unchanged dural based nodular enhancement over right anterior temporal lobe, unchanged bone metastases  Return to clinic every 3 weeks for Kadcyla.

## 2021-07-02 NOTE — Patient Instructions (Signed)
[facility] ONCOLOGY  Discharge Instructions: Thank you for choosing [facility] to provide your oncology and hematology care.   If you have a lab appointment with the [facility], please go directly to the [facility] and check in at the registration area.   Wear comfortable clothing and clothing appropriate for easy access to any Portacath or PICC line.   We strive to give you quality time with your provider. You may need to reschedule your appointment if you arrive late (15 or more minutes).  Arriving late affects you and other patients whose appointments are after yours.  Also, if you miss three or more appointments without notifying the office, you may be dismissed from the clinic at the provider's discretion.      For prescription refill requests, have your pharmacy contact our office and allow 72 hours for refills to be completed.    Today you received the following chemotherapy and/or immunotherapy agents trastuzumab emantasine      To help prevent nausea and vomiting after your treatment, we encourage you to take your nausea medication as directed.  BELOW ARE SYMPTOMS THAT SHOULD BE REPORTED IMMEDIATELY: *FEVER GREATER THAN 100.4 F (38 C) OR HIGHER *CHILLS OR SWEATING *NAUSEA AND VOMITING THAT IS NOT CONTROLLED WITH YOUR NAUSEA MEDICATION *UNUSUAL SHORTNESS OF BREATH *UNUSUAL BRUISING OR BLEEDING *URINARY PROBLEMS (pain or burning when urinating, or frequent urination) *BOWEL PROBLEMS (unusual diarrhea, constipation, pain near the anus) TENDERNESS IN MOUTH AND THROAT WITH OR WITHOUT PRESENCE OF ULCERS (sore throat, sores in mouth, or a toothache) UNUSUAL RASH, SWELLING OR PAIN  UNUSUAL VAGINAL DISCHARGE OR ITCHING   Items with * indicate a potential emergency and should be followed up as soon as possible or go to the Emergency Department if any problems should occur.  Please show the CHEMOTHERAPY ALERT CARD or IMMUNOTHERAPY ALERT CARD at  check-in to the Emergency Department and triage nurse.  Should you have questions after your visit or need to cancel or reschedule your appointment, please contact [facility]  Dept: [phone]  and follow the prompts.  Office hours are 8:00 a.m. to 4:30 p.m. Monday - Friday. Please note that voicemails left after 4:00 p.m. may not be returned until the following business day.  We are closed weekends and major holidays. You have access to a nurse at all times for urgent questions. Please call the main number to the clinic Dept: [phone] and follow the prompts.   For any non-urgent questions, you may also contact your provider using MyChart. We now offer e-Visits for anyone 43 and older to request care online for non-urgent symptoms. For details visit [email or web address].   Also download the MyChart app! Go to the app store, search "MyChart", open the app, select [facility], and log in with your MyChart username and password.  Due to Covid, a mask is required upon entering the hospital/clinic. If you do not have a mask, one will be given to you upon arrival. For doctor visits, patients may have 1 support person aged 12 or older with them. For treatment visits, patients cannot have anyone with them due to current Covid guidelines and our immunocompromised population.

## 2021-07-06 ENCOUNTER — Telehealth: Payer: Self-pay | Admitting: *Deleted

## 2021-07-06 MED ORDER — LEVOFLOXACIN 500 MG PO TABS: 500.0000 mg | ORAL_TABLET | Freq: Every day | ORAL | 0 refills | Status: DC

## 2021-07-06 NOTE — Telephone Encounter (Signed)
[name] Riley states she is having UTI symptoms-burning and pressure. States she had the UTI after her last treatment. Is requesting an antibiotic.

## 2021-07-06 NOTE — Telephone Encounter (Signed)
Notified that we are sending in a prescription

## 2021-07-14 ENCOUNTER — Encounter (HOSPITAL_BASED_OUTPATIENT_CLINIC_OR_DEPARTMENT_OTHER): Payer: Medicare Other | Admitting: Physician Assistant

## 2021-07-14 ENCOUNTER — Other Ambulatory Visit: Payer: Self-pay

## 2021-07-14 DIAGNOSIS — I872 Venous insufficiency (chronic) (peripheral): Secondary | ICD-10-CM | POA: Diagnosis not present

## 2021-07-14 NOTE — Progress Notes (Addendum)
[name], Riley ([id number]) Visit Report for 07/14/2021 Arrival Information Details Patient Name: Date of Service: [name] Riley, KUHLMANN A. 07/14/2021 9:00 A M Medical Record Number: [id number] Patient Account Number: [id number] Date of Birth/Sex: Treating RN: [birth year] (70 y.o. [name] Primary Care Stirling Orton: [name] Other Clinician: Referring Anahis Furgeson: Treating Treavor Blomquist/Extender: Vickii Penna in Treatment: 33 Visit Information History Since Last Visit Added or deleted any medications: No Patient Arrived: Ambulatory Any new allergies or adverse reactions: No Arrival Time: 09:15 Had a fall or experienced change in No Transfer Assistance: None activities of daily living that may affect Patient Identification Verified: Yes risk of falls: Secondary Verification Process Completed: Yes Signs or symptoms of abuse/neglect since last visito No Patient Requires Transmission-Based Precautions: No Hospitalized since last visit: No Patient Has Alerts: No Implantable device outside of the clinic excluding No cellular tissue based products placed in the center since last visit: Has Dressing in Place as Prescribed: Yes Pain Present Now: No Electronic Signature(s) Signed: 07/14/2021 5:34:22 PM By: [name] Entered By: [name] on 07/14/2021 09:19:54 -------------------------------------------------------------------------------- Compression Therapy Details Patient Name: Date of Service: [name], [name] 07/14/2021 9:00 A M Medical Record Number: [id number] Patient Account Number: [id number] Date of Birth/Sex: Treating RN: 12-08-1950 (70 y.o. [name] Primary Care Allecia Bells: [name] Other Clinician: Referring Chou Busler: Treating Leea Rambeau/Extender: Vickii Penna in Treatment: 13 Compression Therapy Performed for Wound Assessment: Wound #4 Right,Lateral Lower Leg Performed By: Clinician [name],  RN Compression Type: Three Layer Post Procedure Diagnosis Same as Pre-procedure Electronic Signature(s) Signed: 07/14/2021 5:34:22 PM By: [name] Entered By: [name] on 07/14/2021 09:56:17 -------------------------------------------------------------------------------- Encounter Discharge Information Details Patient Name: Date of Service: [name] Riley, [name] 07/14/2021 9:00 A M Medical Record Number: [id number] Patient Account Number: [id number] Date of Birth/Sex: Treating RN: 09-15-1951 (70 y.o. [name] Primary Care Damareon Lanni: [name] Other Clinician: Referring Yoneko Talerico: Treating Cia Garretson/Extender: Vickii Penna in Treatment: 13 Encounter Discharge Information Items Post Procedure Vitals Discharge Condition: Stable Temperature (F): 98.3 Ambulatory Status: Ambulatory Pulse (bpm): 99 Discharge Destination: Home Respiratory Rate (breaths/min): 18 Transportation: Private Auto Blood Pressure (mmHg): 135/76 Schedule Follow-up Appointment: Yes Clinical Summary of Care: Provided on 07/14/2021 Form Type Recipient Paper Patient Patient Electronic Signature(s) Signed: 07/14/2021 10:21:18 AM By: [name] Entered By: [name] on 07/14/2021 10:21:17 -------------------------------------------------------------------------------- Lower Extremity Assessment Details Patient Name: Date of Service: [name], [name] 07/14/2021 9:00 A M Medical Record Number: [id number] Patient Account Number: [id number] Date of Birth/Sex: Treating RN: [birth year] (70 y.o. [name] Primary Care Estefanny Moler: [name] Other Clinician: Referring Aneesa Romey: Treating Jacilyn Sanpedro/Extender: Vickii Penna in Treatment: 13 Edema Assessment Assessed: Shirlyn Goltz: No] Patrice Paradise: Yes] Edema: [Left: N] [Right: o] Calf Left: Right: Point of Measurement: 29 cm From Medial Instep 32 cm Ankle Left: Right: Point of Measurement: 10 cm  From Medial Instep 22.5 cm Knee To Floor Left: Right: From Medial Instep 47 cm Vascular Assessment Pulses: Dorsalis Pedis Palpable: [Right:Yes] Electronic Signature(s) Signed: 07/14/2021 5:34:22 PM By: [name] Entered By: [name] on 07/14/2021 09:45:52 -------------------------------------------------------------------------------- Multi Wound Chart Details Patient Name: Date of Service: [name], [name] 07/14/2021 9:00 A M Medical Record Number: [id number] Patient Account Number: [id number] Date of Birth/Sex: Treating RN: 1951-01-13 (70 y.o. [name] Primary Care Peretz Thieme: [name] Other Clinician: Referring Clements Toro: Treating Saaya Procell/Extender: Vickii Penna in Treatment:  13 Vital Signs Height(in): 68 Pulse(bpm): 99 Weight(lbs): 157 Blood Pressure(mmHg): 135/76 Body Mass Index(BMI): 24 Temperature(F): 98.3 Respiratory Rate(breaths/min): 18 Photos: [N/A:N/A] Right Forearm Right, Lateral Lower Leg N/A Wound Location: Trauma Blister N/A Wounding Event: Trauma, Other Venous Leg Ulcer N/A Primary Etiology: Coronary Artery Disease, Coronary Artery Disease, N/A Comorbid History: Hypertension, Myocardial Infarction, Hypertension, Myocardial Infarction, Neuropathy, Received Chemotherapy, Neuropathy, Received Chemotherapy, Received Radiation Received Radiation 07/03/2021 07/11/2021 N/A Date Acquired: 0 0 N/A Weeks of Treatment: Open Open N/A Wound Status: 0.5x1.5x0.1 2.5x2x0.1 N/A Measurements L x W x D (cm) 0.589 3.927 N/A A (cm) : rea 0.059 0.393 N/A Volume (cm) : 0.00% 0.00% N/A % Reduction in A rea: 0.00% 0.00% N/A % Reduction in Volume: Full Thickness Without Exposed Full Thickness Without Exposed N/A Classification: Support Structures Support Structures Medium Medium N/A Exudate A mount: Sanguinous Serous N/A Exudate Type: red amber N/A Exudate Color: Distinct, outline attached Distinct, outline  attached N/A Wound Margin: Large (67-100%) Large (67-100%) N/A Granulation A mount: Red Red N/A Granulation Quality: None Present (0%) None Present (0%) N/A Necrotic A mount: Fat Layer (Subcutaneous Tissue): Yes Fat Layer (Subcutaneous Tissue): Yes N/A Exposed Structures: Fascia: No Fascia: No Tendon: No Tendon: No Muscle: No Muscle: No Joint: No Joint: No Bone: No Bone: No None None N/A Epithelialization: N/A Debridement - Selective/Open Wound N/A Debridement: Pre-procedure Verification/Time Out N/A 09:49 N/A Taken: N/A Other N/A Tissue Debrided: N/A Skin/Dermis N/A Level: N/A 5 N/A Debridement A (sq cm): rea N/A Forceps, Scissors N/A Instrument: N/A Minimum N/A Bleeding: N/A Pressure N/A Hemostasis A chieved: N/A Procedure was tolerated well N/A Debridement Treatment Response: N/A 2.5x2x0.1 N/A Post Debridement Measurements L x W x D (cm) N/A 0.393 N/A Post Debridement Volume: (cm) N/A Compression Therapy N/A Procedures Performed: Debridement Treatment Notes Wound #3 (Forearm) Wound Laterality: Right Cleanser Soap and Water Discharge Instruction: May shower and wash wound with dial antibacterial soap and water prior to dressing change. Peri-Wound Care Topical Primary Dressing Xeroform Occlusive Gauze Dressing, 4x4 in Discharge Instruction: Apply to wound bed as instructed Secondary Dressing ABD Pad, 5x9 Discharge Instruction: Apply over primary dressing as directed. Secured With [employer], 4.5x3.1 (in/yd) Discharge Instruction: Secure with Kerlix as directed. 64M Medipore H Soft Cloth Surgical T 4 x 2 (in/yd) ape Discharge Instruction: Secure dressing with tape as directed. Stretch Netting Compression Wrap Compression Stockings Add-Ons Wound #4 (Lower Leg) Wound Laterality: Right, Lateral Cleanser Soap and Water Discharge Instruction: May shower and wash wound with dial antibacterial soap and water prior to dressing  change. Peri-Wound Care Sween Lotion (Moisturizing lotion) Discharge Instruction: Apply moisturizing lotion as directed Topical Primary Dressing Xeroform Occlusive Gauze Dressing, 4x4 in Discharge Instruction: Apply to wound bed as instructed Secondary Dressing ABD Pad, 5x9 Discharge Instruction: Apply over primary dressing as directed. Secured With Compression Wrap ThreePress (3 layer compression wrap) Discharge Instruction: Apply three layer compression as directed. Compression Stockings Circaid Juxta Lite Compression Wrap Quantity: 1 Right Leg Compression Amount: 30-40 mmHg Discharge Instruction: Apply Circaid Juxta Lite Compression Wrap daily as instructed. Apply first thing in the morning, remove at night before bed. Add-Ons Electronic Signature(s) Signed: 07/20/2021 7:18:23 AM By: Fara Chute By: [name] on 07/20/2021 07:18:23 -------------------------------------------------------------------------------- Multi-Disciplinary Care Plan Details Patient Name: Date of Service: [name] Riley, [name] 07/14/2021 9:00 A M Medical Record Number: [id number] Patient Account Number: [id number] Date of Birth/Sex: Treating RN: 09-11-1951 (70 y.o. [name] Primary Care Dorea Duff: [name] Other Clinician: Referring Takyra Cantrall: Treating Nicey Krah/Extender:  Stone III, Greta Doom, Cletus Gash Weeks in Treatment: [place] reviewed with physician Active Inactive Wound/Skin Impairment Nursing Diagnoses: Impaired tissue integrity Knowledge deficit related to ulceration/compromised skin integrity Goals: Patient/caregiver will verbalize understanding of skin care regimen Date Initiated: 04/14/2021 Target Resolution Date: 08/11/2021 Goal Status: Active Ulcer/skin breakdown will have a volume reduction of 30% by week 4 Date Initiated: 04/14/2021 Target Resolution Date: 08/11/2021 Goal Status: Active Interventions: Assess patient/caregiver  ability to obtain necessary supplies Assess patient/caregiver ability to perform ulcer/skin care regimen upon admission and as needed Assess ulceration(s) every visit Provide education on ulcer and skin care Treatment Activities: Skin care regimen initiated : 04/14/2021 Topical wound management initiated : 04/14/2021 Notes: Electronic Signature(s) Signed: 07/14/2021 5:34:22 PM By: [name] Entered By: [name] on 07/14/2021 09:32:36 -------------------------------------------------------------------------------- Pain Assessment Details Patient Name: Date of Service: [name] Riley, [name] 07/14/2021 9:00 A M Medical Record Number: [id number] Patient Account Number: [id number] Date of Birth/Sex: Treating RN: [birth year] (70 y.o. [name] Primary Care Nike Southers: [name] Other Clinician: Referring Crystalle Popwell: Treating Jekhi Bolin/Extender: Vickii Penna in Treatment: 13 Active Problems Location of Pain Severity and Description of Pain Patient Has Paino No Site Locations Pain Management and Medication Current Pain Management: Electronic Signature(s) Signed: 07/14/2021 5:34:22 PM By: [name] Entered By: [name] on 07/14/2021 09:20:22 -------------------------------------------------------------------------------- Patient/Caregiver Education Details Patient Name: Date of Service: [place], [name] 9/28/2022andnbsp9:00 A M Medical Record Number: [id number] Patient Account Number: [id number] Date of Birth/Gender: Treating RN: 12/07/1950 (70 y.o. [name] Primary Care Physician: [name] Other Clinician: Referring Physician: Treating Physician/Extender: Vickii Penna in Treatment: 13 Education Assessment Education Provided To: Patient Education Topics Provided Venous: Methods: Explain/Verbal, Printed Responses: State content correctly Wound/Skin Impairment: Methods: Explain/Verbal,  Printed Responses: State content correctly Electronic Signature(s) Signed: 07/14/2021 5:34:22 PM By: [name] Entered By: [name] on 07/14/2021 09:33:13 -------------------------------------------------------------------------------- Wound Assessment Details Patient Name: Date of Service: [name] Riley, [name] 07/14/2021 9:00 A M Medical Record Number: [id number] Patient Account Number: [id number] Date of Birth/Sex: Treating RN: [birth year] (70 y.o. [name] Primary Care Chad Donoghue: [name] Other Clinician: Referring Dallis Czaja: Treating Angelik Walls/Extender: Vickii Penna in Treatment: 13 Wound Status Wound Number: 3 Primary Trauma, Other Etiology: Wound Location: Right Forearm Wound Open Wounding Event: Trauma Status: Date Acquired: 07/03/2021 Comorbid Coronary Artery Disease, Hypertension, Myocardial Infarction, Weeks Of Treatment: 0 History: Neuropathy, Received Chemotherapy, Received Radiation Clustered Wound: No Photos Wound Measurements Length: (cm) 0.5 Width: (cm) 1.5 Depth: (cm) 0.1 Area: (cm) 0.589 Volume: (cm) 0.059 % Reduction in Area: 0% % Reduction in Volume: 0% Epithelialization: None Tunneling: No Undermining: No Wound Description Classification: Full Thickness Without Exposed Support Structures Wound Margin: Distinct, outline attached Exudate Amount: Medium Exudate Type: Sanguinous Exudate Color: red Foul Odor After Cleansing: No Slough/Fibrino No Wound Bed Granulation Amount: Large (67-100%) Exposed Structure Granulation Quality: Red Fascia Exposed: No Necrotic Amount: None Present (0%) Fat Layer (Subcutaneous Tissue) Exposed: Yes Tendon Exposed: No Muscle Exposed: No Joint Exposed: No Bone Exposed: No Treatment Notes Wound #3 (Forearm) Wound Laterality: Right Cleanser Soap and Water Discharge Instruction: May shower and wash wound with dial antibacterial soap and water prior to dressing  change. Peri-Wound Care Topical Primary Dressing Xeroform Occlusive Gauze Dressing, 4x4 in Discharge Instruction: Apply to wound bed as instructed Secondary Dressing ABD Pad, 5x9 Discharge Instruction: Apply over primary dressing as directed. Secured With [employer], 4.5x3.1 (in/yd)  Discharge Instruction: Secure with Kerlix as directed. 16M Medipore H Soft Cloth Surgical T 4 x 2 (in/yd) ape Discharge Instruction: Secure dressing with tape as directed. Stretch Netting Compression Wrap Compression Stockings Add-Ons Electronic Signature(s) Signed: 07/20/2021 5:11:46 PM By: [name] Previous Signature: 07/14/2021 5:34:22 PM Version By: [name] Entered By: [name] on 07/20/2021 07:17:59 -------------------------------------------------------------------------------- Wound Assessment Details Patient Name: Date of Service: [name] Riley, [name] 07/14/2021 9:00 A M Medical Record Number: [id number] Patient Account Number: [id number] Date of Birth/Sex: Treating RN: 10-26-1950 (70 y.o. [name] Primary Care Kristapher [place]: [name] Other Clinician: Referring Shamia Uppal: Treating Trilby Way/Extender: Vickii Penna in Treatment: 13 Wound Status Wound Number: 4 Primary Venous Leg Ulcer Etiology: Wound Location: Right, Lateral Lower Leg Wound Open Wounding Event: Blister Status: Date Acquired: 07/11/2021 Comorbid Coronary Artery Disease, Hypertension, Myocardial Infarction, Weeks Of Treatment: 0 History: Neuropathy, Received Chemotherapy, Received Radiation Clustered Wound: No Photos Wound Measurements Length: (cm) 2.5 Width: (cm) 2 Depth: (cm) 0.1 Area: (cm) 3.927 Volume: (cm) 0.393 % Reduction in Area: 0% % Reduction in Volume: 0% Epithelialization: None Tunneling: No Undermining: No Wound Description Classification: Full Thickness Without Exposed Support Structures Wound Margin: Distinct, outline attached Exudate  Amount: Medium Exudate Type: Serous Exudate Color: amber Foul Odor After Cleansing: No Slough/Fibrino No Wound Bed Granulation Amount: Large (67-100%) Exposed Structure Granulation Quality: Red Fascia Exposed: No Necrotic Amount: None Present (0%) Fat Layer (Subcutaneous Tissue) Exposed: Yes Tendon Exposed: No Muscle Exposed: No Joint Exposed: No Bone Exposed: No Treatment Notes Wound #4 (Lower Leg) Wound Laterality: Right, Lateral Cleanser Soap and Water Discharge Instruction: May shower and wash wound with dial antibacterial soap and water prior to dressing change. Peri-Wound Care Sween Lotion (Moisturizing lotion) Discharge Instruction: Apply moisturizing lotion as directed Topical Primary Dressing Xeroform Occlusive Gauze Dressing, 4x4 in Discharge Instruction: Apply to wound bed as instructed Secondary Dressing ABD Pad, 5x9 Discharge Instruction: Apply over primary dressing as directed. Secured With Compression Wrap ThreePress (3 layer compression wrap) Discharge Instruction: Apply three layer compression as directed. Compression Stockings Circaid Juxta Lite Compression Wrap Quantity: 1 Right Leg Compression Amount: 30-40 mmHg Discharge Instruction: Apply Circaid Juxta Lite Compression Wrap daily as instructed. Apply first thing in the morning, remove at night before bed. Add-Ons Electronic Signature(s) Signed: 07/14/2021 5:34:22 PM By: [name] Entered By: [name] on 07/14/2021 10:00:50 -------------------------------------------------------------------------------- Vitals Details Patient Name: Date of Service: [name] [place], [name] 07/14/2021 9:00 A M Medical Record Number: [id number] Patient Account Number: [id number] Date of Birth/Sex: Treating RN: Mar 18, 1951 (70 y.o. [name] Primary Care Schneider [place]: [name] Other Clinician: Referring Baani Bober: Treating Tishina Lown/Extender: Vickii Penna in Treatment:  13 Vital Signs Time Taken: 09:19 Temperature (F): 98.3 Height (in): 68 Pulse (bpm): 99 Weight (lbs): 157 Respiratory Rate (breaths/min): 18 Body Mass Index (BMI): 23.9 Blood Pressure (mmHg): 135/76 Reference Range: 80 - 120 mg / dl Electronic Signature(s) Signed: 07/14/2021 5:34:22 PM By: [name] Entered By: [name] on 07/14/2021 09:20:16

## 2021-07-14 NOTE — Progress Notes (Addendum)
GENIVA, LOHNES ([id number]) Visit Report for 07/14/2021 Chief Complaint Document Details Patient Name: Date of Service: [name] Riley, [name] A. 07/14/2021 9:00 A M Medical Record Number: [id number] Patient Account Number: [id number] Date of Birth/Sex: Treating RN: 1951/01/25 (70 y.o. [name] Primary Care Provider: [name] Other Clinician: Referring Provider: Treating Provider/Extender: Vickii Penna in Treatment: 13 Information Obtained from: Patient Chief Complaint Right LE Ulcer and right forearm ulcer Electronic Signature(s) Signed: 07/14/2021 9:59:50 AM By: Worthy Keeler PA-C Previous Signature: 07/14/2021 9:59:31 AM Version By: Worthy Keeler PA-C Previous Signature: 07/14/2021 9:36:38 AM Version By: Worthy Keeler PA-C Entered By: Worthy Keeler on 07/14/2021 09:59:50 -------------------------------------------------------------------------------- Debridement Details Patient Name: Date of Service: [name], [name] 07/14/2021 9:00 A M Medical Record Number: [id number] Patient Account Number: [id number] Date of Birth/Sex: Treating RN: [birth year] (70 y.o. [name] Primary Care Provider: [name] Other Clinician: Referring Provider: Treating Provider/Extender: Vickii Penna in Treatment: 13 Debridement Performed for Assessment: Wound #4 Right,Lateral Lower Leg Performed By: Physician Worthy Keeler, PA Debridement Type: Debridement Severity of Tissue Pre Debridement: Fat layer exposed Level of Consciousness (Pre-procedure): Awake and Alert Pre-procedure Verification/Time Out Yes - 09:49 Taken: Start Time: 09:50 T Area Debrided (L x W): otal 2.5 (cm) x 2 (cm) = 5 (cm) Tissue and other material debrided: Non-Viable, Skin: Dermis , Other: fluid Level: Skin/Dermis Debridement Description: Selective/Open Wound Instrument: Forceps, Scissors Bleeding: Minimum Hemostasis Achieved: Pressure End Time:  09:54 Response to Treatment: Procedure was tolerated well Level of Consciousness (Post- Awake and Alert procedure): Post Debridement Measurements of Total Wound Length: (cm) 2.5 Width: (cm) 2 Depth: (cm) 0.1 Volume: (cm) 0.393 Character of Wound/Ulcer Post Debridement: Stable Severity of Tissue Post Debridement: Fat layer exposed Post Procedure Diagnosis Same as Pre-procedure Electronic Signature(s) Signed: 07/14/2021 5:34:22 PM By: [name] Signed: 07/16/2021 4:42:31 PM By: Worthy Keeler PA-C Entered By: [name] on 07/14/2021 09:56:01 -------------------------------------------------------------------------------- HPI Details Patient Name: Date of Service: [name], [name] 07/14/2021 9:00 A M Medical Record Number: [id number] Patient Account Number: [id number] Date of Birth/Sex: Treating RN: Mar 01, 1951 (70 y.o. [name] Primary Care Provider: [name] Other Clinician: Referring Provider: Treating Provider/Extender: Vickii Penna in Treatment: 13 History of Present Illness HPI Description: 04/14/2021 upon evaluation today patient presents for initial inspection here in the clinic concerning issues that she has been having with a wound on her right anterior lower leg after she had an abrasion trauma 4 weeks ago. She tells me that unfortunately following this trauma she has been experiencing quite a bit of discomfort at times with some burning and stinging. She is also had a scab up once or twice but states that right now she has been unable to really get it to scab back over. This has been a frustration for her and she is not really able to get it to heal as quickly and effectively as she would like to see. The patient also tells me that she has been tolerating the dressing changes without complication. She has just been using over-the-counter antibiotic ointment and try to keep it covered with a dry dressing at times depending on  what she was told to do. Nonetheless right now she is also been on Keflex that did not really seem to help she was switched over to Augmentin she does started that on Friday. Currently I do not see anything that seems  to be obviously infected but nonetheless I think it is fine for her to take the Augmentin just to make sure that we do not get anything that initiates in the interim. The patient is currently undergoing chemotherapy she has had previous radiation. Nonetheless in regard to the chemotherapy this could be affecting her ability to heal to some degree. 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 She has hypertension, neuropathy associated with the chemotherapy, and a history of heart disease. She is currently again undergoing active chemotherapy for recurrent breast cancer. 04/21/2021 upon evaluation today patient appears to be doing well with regard to her leg ulcer. This is actually looking significantly improved. There does not appear to be any signs of infection visually and I am happy in that regard. Fortunately there is no signs of systemic infection either which is also great news. No fevers, chills, nausea, vomiting, or diarrhea. 05/05/2021 upon evaluation today patient appears to be doing well with regard to her leg ulcer. She has been tolerating the dressing changes without complication. Fortunately there does not appear to be any signs of infection which is great news. No fevers, chills, nausea, vomiting, or diarrhea. 05/12/2021 upon evaluation today patient actually appears to be making good improvement in general. I am extremely pleased with where things stand today. There is no signs of active infection which is also great news. 05/19/2021 upon evaluation today patient appears to be doing well with regard to her wound. This is actually showing signs of excellent granulation  epithelization at this point. I do not see any evidence of infection which is great and overall I am extremely pleased with where we stand today. In general I think that the patient is making leasing strides towards getting this healed. The issue that she had unfortunately her mother did pass this past week. She was at the wedding yesterday when she actually felt faint coming out of the church there is a lot of emotional situation involved obviously. Nonetheless she somewhat started to pass out her brothers caught her but she did hit her leg laterally underneath the wrap on the side of the steps. This caused a small skin tear and some contusion fortunately this is not too bad but nonetheless I do think that this needs to be monitored currently. 05/26/2021 upon evaluation today patient appears to be doing excellent in regard to her wound on the leg. This is can require some sharp debridement but in general I feel like she is doing very well. The new skin tear that was noted last week is actually healing quite nicely reattached and has done great I think this will probably be pretty much resolved by next week and then should start just even an hour after that. As far as the color of the skin is concerned the bruising will fade is what I mean by that. 06/05/2021 upon evaluation today patient appears to be doing well with regard to her wound. She has been tolerating the dressing changes without complication. Fortunately there does not appear to be any signs of infection the good news is she is actually healing quite nicely and very close to resolution on the anterior wound and the new wound that was on the right last time where she had struck this during almost collapsing at her mom's funeral actually seems to be doing better although there is still a small opening here as well. 06/16/2021  upon evaluation today patient appears to be doing well with regard to her leg ulcer. She has been tolerating the dressing  changes without complication. Fortunately there does not appear to be any signs of active infection at this time. No fevers, chills, nausea, vomiting, or diarrhea. 06/23/2021 upon evaluation today patient appears to be doing decently well in regard to her wounds. In fact everything appears to be completely healed based on what I see currently. Fortunately there does not appear to be any signs of active infection at this time. No fevers, chills, nausea, vomiting, or diarrhea. Readmission: 07/14/2021 upon evaluation today patient actually appears to be doing decently well in regard to her original wound which again is completely healed at this point. Unfortunately she does have a blister on the medial aspect of her leg which occurred after she was advised to hold off on using her compression stockings due to the fact she was having a lot of bruising. Fortunately though she has this blister there does not appear to be any signs of infection which is good news nonetheless I do believe she is going to require [name] to go back to utilizing the compression wrap to get this under control. She also has a skin tear on her forearm which is new as well. Electronic Signature(s) Signed: 07/16/2021 4:00:42 PM By: Worthy Keeler PA-C Entered By: Worthy Keeler on 07/16/2021 16:00:42 -------------------------------------------------------------------------------- Physical Exam Details Patient Name: Date of Service: [place], [name] 07/14/2021 9:00 A M Medical Record Number: [id number] Patient Account Number: [id number] Date of Birth/Sex: Treating RN: 06-10-1951 (70 y.o. [name] Primary Care Provider: [name] Other Clinician: Referring Provider: Treating Provider/Extender: Vickii Penna in Treatment: 60 Constitutional Well-nourished and well-hydrated in no acute distress. Respiratory normal breathing without difficulty. Psychiatric this patient is able to make decisions  and demonstrates good insight into disease process. Alert and Oriented x 3. pleasant and cooperative. Notes Upon inspection patient's wound bed actually showed signs of needing to have some of the blistered tissue removed so that we can get a dressing to the wound bed. Actually was able to do that with excision forceps today without complication postdebridement wound bed appears to be doing much better. Again these are completely new wounds both on the forearm and the leg compared to what we were previously treating. Electronic Signature(s) Signed: 07/16/2021 4:01:05 PM By: Worthy Keeler PA-C Entered By: Worthy Keeler on 07/16/2021 16:01:05 -------------------------------------------------------------------------------- Physician Orders Details Patient Name: Date of Service: [place], [name] 07/14/2021 9:00 A M Medical Record Number: [id number] Patient Account Number: [id number] Date of Birth/Sex: Treating RN: 19-Jun-1951 (70 y.o. 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 - with Margarita Grizzle Return A Other: - [employer]=Supplies Bathing/ Shower/ Hygiene May shower with protection but do not get wound dressing(s) wet. Edema Control - Lymphedema / SCD / Other Elevate legs to the  level of the heart or above for  30 minutes daily and/or when sitting, a frequency of: Avoid standing for long periods of time. Patient to wear own compression stockings every day. - T left leg o Additional Orders / Instructions Follow Nutritious Diet Wound Treatment Wound #3 - Forearm Wound Laterality: Right Cleanser: Soap and Water 1 x Per Day/15 Days Discharge Instructions: May shower and wash wound with dial antibacterial soap and water prior to dressing change. Prim Dressing: Xeroform Occlusive Gauze Dressing, 4x4 in 1 x Per Day/15 Days ary Discharge Instructions: Apply to wound bed as instructed Secondary Dressing: ABD Pad, 5x9 1 x Per Day/15 Days Discharge Instructions: Apply over primary dressing as directed. Secured With: [employer], 4.5x3.1 (in/yd) 1 x Per Day/15 Days Discharge Instructions: Secure with Kerlix as directed. Secured With: 47M Medipore H Soft Cloth Surgical T 4 x 2 (in/yd) 1 x Per Day/15 Days ape Discharge Instructions: Secure dressing with tape as directed. Secured With: Stretch Netting 1 x Per Day/15 Days Wound #4 - Lower Leg Wound Laterality: Right, Lateral Cleanser: Soap and Water 1 x Per Week/15 Days Discharge Instructions: May shower and wash wound with dial antibacterial soap and water prior to dressing change. Peri-Wound Care: Sween Lotion (Moisturizing lotion) 1 x Per Week/15 Days Discharge Instructions: Apply moisturizing lotion as directed Prim Dressing: Xeroform Occlusive Gauze Dressing, 4x4 in 1 x Per Week/15 Days ary Discharge Instructions: Apply to wound bed as instructed Secondary Dressing: ABD Pad, 5x9 1 x Per Week/15 Days Discharge Instructions: Apply over primary dressing as directed. Compression Wrap: ThreePress (3 layer compression wrap) 1 x Per Week/15 Days Discharge Instructions: Apply three layer compression as directed. Compression Stockings: Circaid Juxta Lite Compression Wrap Right Leg Compression Amount: 30-40  mmHG Discharge Instructions: Apply Circaid Juxta Lite Compression Wrap daily as instructed. Apply first thing in the morning, remove at night before bed. Electronic Signature(s) Signed: 07/14/2021 5:34:22 PM By: [name] Signed: 07/16/2021 4:42:31 PM By: Worthy Keeler PA-C Entered By: [name] on 07/14/2021 09:53:21 -------------------------------------------------------------------------------- Problem List Details Patient Name: Date of Service: [place], [name] 07/14/2021 9:00 A M Medical Record Number: [id number] Patient Account Number: [id number] Date of Birth/Sex: Treating RN: 12/06/1950 (70 y.o. [name] Primary Care Provider: [name] Other Clinician: Referring Provider: Treating Provider/Extender: Vickii Penna in Treatment: 13 Active Problems ICD-10 Encounter Code Description Active Date MDM Diagnosis I87.2 Venous insufficiency (chronic) (peripheral) 04/14/2021 No Yes L97.812 Non-pressure chronic ulcer of other part of right lower leg with fat layer 04/14/2021 No Yes exposed S51.811A Laceration without foreign body of right forearm, initial encounter 07/14/2021 No Yes G90.09 Other idiopathic peripheral autonomic neuropathy 04/14/2021 No Yes C79.81 Secondary malignant neoplasm of breast 04/14/2021 No Yes I10 Essential (primary) hypertension 04/14/2021 No Yes I25.10 Atherosclerotic heart disease of native coronary artery without angina pectoris 04/14/2021 No Yes Z92.21 Personal history of antineoplastic chemotherapy 04/14/2021 No Yes Inactive Problems Resolved Problems ICD-10 Code Description Active Date Resolved Date S80.811A Abrasion, right lower leg, initial encounter 04/14/2021 04/14/2021 Electronic Signature(s) Signed: 07/14/2021 9:55:59 AM By: Worthy Keeler PA-C Previous Signature: 07/14/2021 9:36:31 AM Version By: Worthy Keeler PA-C Entered By: Worthy Keeler on 07/14/2021  09:55:58 -------------------------------------------------------------------------------- Progress Note Details Patient Name: Date of Service: [place], [name] 07/14/2021 9:00 A M Medical Record Number: [id number] Patient Account Number: [id number] Date of Birth/Sex: Treating RN: 1951/02/07 (70 y.o. [name] Primary Care Provider: [name] Other Clinician: Referring Provider: Treating Provider/Extender: Vickii Penna in  Treatment: 13 Subjective Chief Complaint Information obtained from Patient Right LE Ulcer and right forearm ulcer History of Present Illness (HPI) 04/14/2021 upon evaluation today patient presents for initial inspection here in the clinic concerning issues that she has been having with a wound on her right anterior lower leg after she had an abrasion trauma 4 weeks ago. She tells me that unfortunately following this trauma she has been experiencing quite a bit of discomfort at times with some burning and stinging. She is also had a scab up once or twice but states that right now she has been unable to really get it to scab back over. This has been a frustration for her and she is not really able to get it to heal as quickly and effectively as she would like to see. The patient also tells me that she has been tolerating the dressing changes without complication. She has just been using over-the-counter antibiotic ointment and try to keep it covered with a dry dressing at times depending on what she was told to do. Nonetheless right now she is also been on Keflex that did not really seem to help she was switched over to Augmentin she does started that on Friday. Currently I do not see anything that seems to be obviously infected but nonetheless I think it is fine for her to take the Augmentin just to make sure that we do not get anything that initiates in the interim. The patient is currently undergoing chemotherapy she has had previous  radiation. Nonetheless in regard to the chemotherapy this could be affecting her ability to heal to some degree. 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 She has hypertension, neuropathy associated with the chemotherapy, and a history of heart disease. She is currently again undergoing active chemotherapy for recurrent breast cancer. 04/21/2021 upon evaluation today patient appears to be doing well with regard to her leg ulcer. This is actually looking significantly improved. There does not appear to be any signs of infection visually and I am happy in that regard. Fortunately there is no signs of systemic infection either which is also great news. No fevers, chills, nausea, vomiting, or diarrhea. 05/05/2021 upon evaluation today patient appears to be doing well with regard to her leg ulcer. She has been tolerating the dressing changes without complication. Fortunately there does not appear to be any signs of infection which is great news. No fevers, chills, nausea, vomiting, or diarrhea. 05/12/2021 upon evaluation today patient actually appears to be making good improvement in general. I am extremely pleased with where things stand today. There is no signs of active infection which is also great news. 05/19/2021 upon evaluation today patient appears to be doing well with regard to her wound. This is actually showing signs of excellent granulation epithelization at this point. I do not see any evidence of infection which is great and overall I am extremely pleased with where we stand today. In general I think that the patient is making leasing strides towards getting this healed. The issue that she had unfortunately her mother did pass this past week. She was at the wedding yesterday when she actually felt faint coming out of the church there is a lot of emotional situation involved obviously.  Nonetheless she somewhat started to pass out her brothers caught her but she did hit her leg laterally underneath the wrap on the side of the steps.  This caused a small skin tear and some contusion fortunately this is not too bad but nonetheless I do think that this needs to be monitored currently. 05/26/2021 upon evaluation today patient appears to be doing excellent in regard to her wound on the leg. This is can require some sharp debridement but in general I feel like she is doing very well. The new skin tear that was noted last week is actually healing quite nicely reattached and has done great I think this will probably be pretty much resolved by next week and then should start just even an hour after that. As far as the color of the skin is concerned the bruising will fade is what I mean by that. 06/05/2021 upon evaluation today patient appears to be doing well with regard to her wound. She has been tolerating the dressing changes without complication. Fortunately there does not appear to be any signs of infection the good news is she is actually healing quite nicely and very close to resolution on the anterior wound and the new wound that was on the right last time where she had struck this during almost collapsing at her mom's funeral actually seems to be doing better although there is still a small opening here as well. 06/16/2021 upon evaluation today patient appears to be doing well with regard to her leg ulcer. She has been tolerating the dressing changes without complication. Fortunately there does not appear to be any signs of active infection at this time. No fevers, chills, nausea, vomiting, or diarrhea. 06/23/2021 upon evaluation today patient appears to be doing decently well in regard to her wounds. In fact everything appears to be completely healed based on what I see currently. Fortunately there does not appear to be any signs of active infection at this time. No fevers, chills, nausea,  vomiting, or diarrhea. Readmission: 07/14/2021 upon evaluation today patient actually appears to be doing decently well in regard to her original wound which again is completely healed at this point. Unfortunately she does have a blister on the medial aspect of her leg which occurred after she was advised to hold off on using her compression stockings due to the fact she was having a lot of bruising. Fortunately though she has this blister there does not appear to be any signs of infection which is good news nonetheless I do believe she is going to require [name] to go back to utilizing the compression wrap to get this under control. She also has a skin tear on her forearm which is new as well. Objective Constitutional Well-nourished and well-hydrated in no acute distress. Vitals Time Taken: 9:19 AM, Height: 68 in, Weight: 157 lbs, BMI: 23.9, Temperature: 98.3 F, Pulse: 99 bpm, Respiratory Rate: 18 breaths/min, Blood Pressure: 135/76 mmHg. Respiratory normal breathing without difficulty. Psychiatric this patient is able to make decisions and demonstrates good insight into disease process. Alert and Oriented x 3. pleasant and cooperative. General Notes: Upon inspection patient's wound bed actually showed signs of needing to have some of the blistered tissue removed so that we can get a dressing to the wound bed. Actually was able to do that with excision forceps today without complication postdebridement wound bed appears to be doing much better. Again these are completely new wounds both on the forearm and the leg compared to what we were previously treating. Integumentary (Hair, Skin) Wound #3 status is Open. Original cause of wound was Trauma. The date acquired was: 07/03/2021. The wound is located on the  Right Forearm. The wound measures 0.5cm length x 1.5cm width x 0.1cm depth; 0.589cm^2 area and 0.059cm^3 volume. There is Fat Layer (Subcutaneous Tissue) exposed. There is no tunneling or  undermining noted. There is a medium amount of sanguinous drainage noted. The wound margin is distinct with the outline attached to the wound base. There is large (67-100%) red granulation within the wound bed. There is no necrotic tissue within the wound bed. Wound #4 status is Open. Original cause of wound was Blister. The date acquired was: 07/11/2021. The wound is located on the Right,Lateral Lower Leg. The wound measures 2.5cm length x 2cm width x 0.1cm depth; 3.927cm^2 area and 0.393cm^3 volume. There is Fat Layer (Subcutaneous Tissue) exposed. There is no tunneling or undermining noted. There is a medium amount of serous drainage noted. The wound margin is distinct with the outline attached to the wound base. There is large (67-100%) red granulation within the wound bed. There is no necrotic tissue within the wound bed. 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 There was a Selective/Open Wound Skin/Dermis Debridement with a total area of 5 sq cm performed by Worthy Keeler, PA. With the following instrument(s): Forceps, and Scissors to remove Non-Viable tissue/material. Material removed includes Skin: Dermis and Other: fluid. No specimens were taken. A time out was conducted at 09:49, prior to the start of the procedure. A Minimum amount of bleeding was controlled with Pressure. The  procedure was tolerated well. Post Debridement Measurements: 2.5cm length x 2cm width x 0.1cm depth; 0.393cm^3 volume. Character of Wound/Ulcer Post Debridement is stable. Severity of Tissue Post Debridement is: Fat layer exposed. Post procedure Diagnosis Wound #4: Same as Pre-Procedure Pre-procedure diagnosis of Wound #4 is a Venous Leg Ulcer located on the Right,Lateral Lower Leg . There was a Three Layer Compression Therapy Procedure by [name], RN. Post procedure Diagnosis Wound #4: Same as Pre-Procedure Plan Follow-up Appointments: Return Appointment in 1 week. - with Margarita Grizzle Other: - [facility]=Supplies Bathing/ Shower/ Hygiene: May shower with protection but do not get wound dressing(s) wet. Edema Control - Lymphedema / SCD / Other: Elevate legs to the level of the heart or above for 30 minutes daily and/or when sitting, a frequency of: Avoid standing for long periods of time. Patient to wear own compression stockings every day. - T left leg o Additional Orders / Instructions: Follow Nutritious Diet WOUND #3: - Forearm Wound Laterality: Right Cleanser: Soap and Water 1 x Per DHR/41 Days Discharge Instructions: May shower and wash wound with dial antibacterial soap and water prior to dressing change. Prim Dressing: Xeroform Occlusive Gauze Dressing, 4x4 in 1 x Per Day/15 Days ary Discharge Instructions: Apply to wound bed as instructed Secondary Dressing: ABD Pad, 5x9 1 x Per Day/15 Days Discharge Instructions: Apply over primary dressing as directed. Secured With: [employer], 4.5x3.1 (in/yd) 1 x Per Day/15 Days Discharge Instructions: Secure with Kerlix as directed. Secured With: 37M Medipore H Soft Cloth Surgical T 4 x 2 (in/yd) 1 x Per Day/15 Days ape Discharge Instructions:  Secure dressing with tape as directed. Secured With: Stretch Netting 1 x Per Day/15 Days WOUND #4: - Lower Leg Wound Laterality: Right, Lateral Cleanser: Soap and Water 1 x Per Week/15  Days Discharge Instructions: May shower and wash wound with dial antibacterial soap and water prior to dressing change. Peri-Wound Care: Sween Lotion (Moisturizing lotion) 1 x Per Week/15 Days Discharge Instructions: Apply moisturizing lotion as directed Prim Dressing: Xeroform Occlusive Gauze Dressing, 4x4 in 1 x Per Week/15 Days ary Discharge Instructions: Apply to wound bed as instructed Secondary Dressing: ABD Pad, 5x9 1 x Per Week/15 Days Discharge Instructions: Apply over primary dressing as directed. Com pression Wrap: ThreePress (3 layer compression wrap) 1 x Per Week/15 Days Discharge Instructions: Apply three layer compression as directed. Com pression Stockings: Circaid Juxta Lite Compression Wrap Compression Amount: 30-40 mmHg (right) Discharge Instructions: Apply Circaid Juxta Lite Compression Wrap daily as instructed. Apply first thing in the morning, remove at night before bed. 1. I am good recommend currently that we going to continue with wound care measures as before and the patient is in agreement with the plan. This includes the use of the Xeroform gauze which in the past is done well with any skin tears I think that do a good job here as well. 2. I am also going to recommend that we have the patient initiated back in a compression wrap. I do believe that the 3 layer compression wrap was probably appropriate that did well for her in the past. 3. I am also can recommend that we have her continue to elevate her legs much as possible and use her Velcro compression wrap on the left leg. We will see patient back for reevaluation in 1 week here in the clinic. If anything worsens or changes patient will contact our office for additional recommendations. Electronic Signature(s) Signed: 07/16/2021 4:01:58 PM By: Worthy Keeler PA-C Entered By: Worthy Keeler on 07/16/2021 16:01:57 -------------------------------------------------------------------------------- SuperBill  Details Patient Name: Date of Service: Schlee, [name] 07/14/2021 Medical Record Number: [id number] Patient Account Number: [id number] Date of Birth/Sex: Treating RN: [birth year] (70 y.o. [name] Primary Care Provider: [name] Other Clinician: Referring Provider: Treating Provider/Extender: Vickii Penna in Treatment: 13 Diagnosis Coding ICD-10 Codes Code Description I87.2 Venous insufficiency (chronic) (peripheral) L97.812 Non-pressure chronic ulcer of other part of right lower leg with fat layer exposed S51.811A Laceration without foreign body of right forearm, initial encounter G90.09 Other idiopathic peripheral autonomic neuropathy C79.81 Secondary malignant neoplasm of breast I10 Essential (primary) hypertension I25.10 Atherosclerotic heart disease of native coronary artery without angina pectoris Z92.21 Personal history of antineoplastic chemotherapy Facility Procedures CPT4 Code: [id number] Description: [phone] - DEBRIDE WOUND 1ST 20 SQ CM OR < ICD-10 Diagnosis Description L97.812 Non-pressure chronic ulcer of other part of right lower leg with fat layer exposed Modifier: Quantity: 1 Physician Procedures : CPT4 Code Description Modifier [id number] - WC PHYS LEVEL 4 - EST PT 25 ICD-10 Diagnosis Description I87.2 Venous insufficiency (chronic) (peripheral) L97.812 Non-pressure chronic ulcer of other part of right lower leg with fat layer exposed  S51.811A Laceration without foreign body of right forearm, initial encounter G90.09 Other idiopathic peripheral autonomic neuropathy Quantity: 1 : [id number] - WC PHYS DEBR WO ANESTH 20 SQ CM ICD-10 Diagnosis Description P38.250 Non-pressure chronic ulcer of other part of right lower leg with fat layer exposed Quantity: 1 Electronic Signature(s) Signed: 07/16/2021 4:02:18 PM By: Worthy Keeler PA-C Signed: 07/16/2021 4:02:18 PM By:  Worthy Keeler PA-C Previous Signature: 07/14/2021 10:20:03 AM  Version By: [name] Entered By: Worthy Keeler on 07/16/2021 16:02:18

## 2021-07-21 ENCOUNTER — Other Ambulatory Visit: Payer: Self-pay

## 2021-07-21 ENCOUNTER — Encounter (HOSPITAL_BASED_OUTPATIENT_CLINIC_OR_DEPARTMENT_OTHER): Payer: Medicare Other | Attending: Physician Assistant | Admitting: Physician Assistant

## 2021-07-21 DIAGNOSIS — X58XXXA Exposure to other specified factors, initial encounter: Secondary | ICD-10-CM | POA: Diagnosis not present

## 2021-07-21 DIAGNOSIS — Z9221 Personal history of antineoplastic chemotherapy: Secondary | ICD-10-CM | POA: Diagnosis not present

## 2021-07-21 DIAGNOSIS — I2510 Atherosclerotic heart disease of native coronary artery without angina pectoris: Secondary | ICD-10-CM | POA: Insufficient documentation

## 2021-07-21 DIAGNOSIS — Z923 Personal history of irradiation: Secondary | ICD-10-CM | POA: Diagnosis not present

## 2021-07-21 DIAGNOSIS — L97812 Non-pressure chronic ulcer of other part of right lower leg with fat layer exposed: Secondary | ICD-10-CM | POA: Insufficient documentation

## 2021-07-21 DIAGNOSIS — C7981 Secondary malignant neoplasm of breast: Secondary | ICD-10-CM | POA: Diagnosis not present

## 2021-07-21 DIAGNOSIS — G9009 Other idiopathic peripheral autonomic neuropathy: Secondary | ICD-10-CM | POA: Insufficient documentation

## 2021-07-21 DIAGNOSIS — I872 Venous insufficiency (chronic) (peripheral): Secondary | ICD-10-CM | POA: Diagnosis not present

## 2021-07-21 DIAGNOSIS — I10 Essential (primary) hypertension: Secondary | ICD-10-CM | POA: Insufficient documentation

## 2021-07-21 DIAGNOSIS — S51811A Laceration without foreign body of right forearm, initial encounter: Secondary | ICD-10-CM | POA: Diagnosis not present

## 2021-07-21 NOTE — Progress Notes (Addendum)
FARRIN, SHADLE ([id number]) Visit Report for 07/21/2021 Chief Complaint Document Details Patient Name: Date of Service: [name] Riley, [name] A. 07/21/2021 8:00 A M Medical Record Number: [id number] Patient Account Number: [id number] Date of Birth/Sex: Treating RN: 01/02/1951 (70 y.o. [name] Riley Primary Care Provider: [name] Other Clinician: Referring Provider: Treating Provider/Extender: Vickii Penna in Treatment: 14 Information Obtained from: Patient Chief Complaint Right LE Ulcer and right forearm ulcer Electronic Signature(s) Signed: 07/21/2021 8:17:10 AM By: Worthy Keeler PA-C Entered By: Worthy Keeler on 07/21/2021 08:17:10 -------------------------------------------------------------------------------- HPI Details Patient Name: Date of Service: [name] Riley, [name] 07/21/2021 8:00 A M Medical Record Number: [id number] Patient Account Number: [id number] Date of Birth/Sex: Treating RN: 11/30/1950 (70 y.o. [name] Riley Primary Care Provider: [name] Other Clinician: Referring Provider: Treating Provider/Extender: Vickii Penna in Treatment: 14 History of Present Illness HPI Description: 04/14/2021 upon evaluation today patient presents for initial inspection here in the clinic concerning issues that she has been having with a wound on her right anterior lower leg after she had an abrasion trauma 4 weeks ago. She tells me that unfortunately following this trauma she has been experiencing quite a bit of discomfort at times with some burning and stinging. She is also had a scab up once or twice but states that right now she has been unable to really get it to scab back over. This has been a frustration for her and she is not really able to get it to heal as quickly and effectively as she would like to see. The patient also tells me that she has been tolerating the dressing changes without complication. She has just  been using over-the-counter antibiotic ointment and try to keep it covered with a dry dressing at times depending on what she was told to do. Nonetheless right now she is also been on Keflex that did not really seem to help she was switched over to Augmentin she does started that on Friday. Currently I do not see anything that seems to be obviously infected but nonetheless I think it is fine for her to take the Augmentin just to make sure that we do not get anything that initiates in the interim. The patient is currently undergoing chemotherapy she has had previous radiation. Nonetheless in regard to the chemotherapy this could be affecting her ability to heal to some degree. 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 She has hypertension, neuropathy associated with the chemotherapy, and a history of heart disease. She is currently again undergoing active chemotherapy for recurrent breast cancer. 04/21/2021 upon evaluation today patient appears to be doing well with regard to her leg ulcer. This is actually looking significantly improved. There does not appear to be any signs of infection visually and I am happy in that regard. Fortunately there is no signs of systemic infection either which is also great news. No fevers, chills, nausea, vomiting, or diarrhea. 05/05/2021 upon evaluation today patient appears to be doing well with regard to her leg ulcer. She has been tolerating the dressing changes without complication. Fortunately there does not appear to be any signs of infection which is great news. No fevers, chills, nausea, vomiting, or diarrhea. 05/12/2021 upon evaluation today patient actually appears to be making good improvement in general. I am extremely pleased with where things stand today. There is no signs  of active infection which is also great news. 05/19/2021 upon evaluation today  patient appears to be doing well with regard to her wound. This is actually showing signs of excellent granulation epithelization at this point. I do not see any evidence of infection which is great and overall I am extremely pleased with where we stand today. In general I think that the patient is making leasing strides towards getting this healed. The issue that she had unfortunately her mother did pass this past week. She was at the wedding yesterday when she actually felt faint coming out of the church there is a lot of emotional situation involved obviously. Nonetheless she somewhat started to pass out her brothers caught her but she did hit her leg laterally underneath the wrap on the side of the steps. This caused a small skin tear and some contusion fortunately this is not too bad but nonetheless I do think that this needs to be monitored currently. 05/26/2021 upon evaluation today patient appears to be doing excellent in regard to her wound on the leg. This is can require some sharp debridement but in general I feel like she is doing very well. The new skin tear that was noted last week is actually healing quite nicely reattached and has done great I think this will probably be pretty much resolved by next week and then should start just even an hour after that. As far as the color of the skin is concerned the bruising will fade is what I mean by that. 06/05/2021 upon evaluation today patient appears to be doing well with regard to her wound. She has been tolerating the dressing changes without complication. Fortunately there does not appear to be any signs of infection the good news is she is actually healing quite nicely and very close to resolution on the anterior wound and the new wound that was on the right last time where she had struck this during almost collapsing at her mom's funeral actually seems to be doing better although there is still a small opening here as well. 06/16/2021 upon  evaluation today patient appears to be doing well with regard to her leg ulcer. She has been tolerating the dressing changes without complication. Fortunately there does not appear to be any signs of active infection at this time. No fevers, chills, nausea, vomiting, or diarrhea. 06/23/2021 upon evaluation today patient appears to be doing decently well in regard to her wounds. In fact everything appears to be completely healed based on what I see currently. Fortunately there does not appear to be any signs of active infection at this time. No fevers, chills, nausea, vomiting, or diarrhea. Readmission: 07/14/2021 upon evaluation today patient actually appears to be doing decently well in regard to her original wound which again is completely healed at this point. Unfortunately she does have a blister on the medial aspect of her leg which occurred after she was advised to hold off on using her compression stockings due to the fact she was having a lot of bruising. Fortunately though she has this blister there does not appear to be any signs of infection which is good news nonetheless I do believe she is going to require [name] to go back to utilizing the compression wrap to get this under control. She also has a skin tear on her forearm which is new as well. 07/21/2021 upon evaluation today patient's wound is actually showing signs of excellent improvement which is great news. I do not see any evidence  of active infection at this time which is great news as well. Overall I think that she is really doing excellent. Electronic Signature(s) Signed: 07/21/2021 9:15:13 AM By: Worthy Keeler PA-C Entered By: Worthy Keeler on 07/21/2021 09:15:13 -------------------------------------------------------------------------------- Physical Exam Details Patient Name: Date of Service: [name] Riley, [name] 07/21/2021 8:00 A M Medical Record Number: [id number] Patient Account Number: [id number] Date of Birth/Sex: Treating  RN: 06-26-1951 (70 y.o. [name] Riley Primary Care Provider: [name] Other Clinician: Referring Provider: Treating Provider/Extender: Vickii Penna in Treatment: 45 Constitutional Well-nourished and well-hydrated in no acute distress. Respiratory normal breathing without difficulty. Psychiatric this patient is able to make decisions and demonstrates good insight into disease process. Alert and Oriented x 3. pleasant and cooperative. Notes upon inspection patient's wound bed actually showed signs of good granulation and epithelization at this point which is great news. I think that she is very close to complete resolution and I really do not see any major issues here as far as I am concerned. Overall the patient is extremely pleased with where she stands. Electronic Signature(s) Signed: 07/21/2021 9:15:38 AM By: Worthy Keeler PA-C Entered By: Worthy Keeler on 07/21/2021 09:15:37 -------------------------------------------------------------------------------- Physician Orders Details Patient Name: Date of Service: [name] Riley, [name] 07/21/2021 8:00 A M Medical Record Number: [id number] Patient Account Number: [id number] Date of Birth/Sex: Treating RN: December 27, 1950 (70 y.o. 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 - with Margarita Grizzle Return A Nurse Visit: - Wednesday 07/28/21 : If wound healed may put on JuxtaLite, if not then re-wrap. Other: - Halo=Supplies Bathing/ Shower/ Hygiene May shower with protection but do not get wound dressing(s) wet. Edema Control - Lymphedema / SCD / Other Elevate legs to the level of the heart or above for 30 minutes daily and/or when sitting, a frequency of: Avoid standing for long periods of time. Patient to wear own compression stockings every day. - T left leg o Additional Orders / Instructions Follow Nutritious Diet Non Wound Condition pply the following to affected area as directed: - Sarna Anti Itch Lotion or generic lotion with Pramoxine Hydrochloride A Wound Treatment Wound #4 - Lower Leg Wound Laterality: Right, Lateral Cleanser: Soap and Water 1 x Per Week/15 Days Discharge Instructions: May shower and wash wound with dial antibacterial soap and water prior to dressing change. Peri-Wound Care: Sween Lotion (Moisturizing lotion) 1 x Per Week/15 Days Discharge Instructions: Apply moisturizing lotion as directed Prim Dressing: Xeroform Occlusive Gauze Dressing, 4x4 in 1 x Per Week/15 Days ary Discharge Instructions: Apply to wound bed as instructed Secondary Dressing: ABD Pad, 5x9 1 x Per Week/15 Days Discharge Instructions: Apply over primary dressing as directed. Compression Wrap: ThreePress (3 layer compression wrap) 1 x Per Week/15 Days Discharge Instructions: Apply three layer compression as directed. Compression Stockings: Circaid Juxta Lite Compression Wrap Right Leg Compression Amount: 30-40 mmHG Discharge Instructions: Apply Circaid Juxta Lite Compression  Wrap daily as instructed. Apply first thing in the morning, remove at night before bed. Electronic Signature(s) Signed: 07/22/2021 5:58:36 PM By: [name] Signed: 07/23/2021 9:30:20 AM  By: Worthy Keeler PA-C Entered By: [name] on 07/21/2021 08:39:47 -------------------------------------------------------------------------------- Problem List Details Patient Name: Date of Service: [name] Riley, [name] 07/21/2021 8:00 A M Medical Record Number: [id number] Patient Account Number: [id number] Date of Birth/Sex: Treating RN: [birth year] (70 y.o. 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 04/14/2021 No Yes C79.81 Secondary malignant neoplasm of breast 04/14/2021 No Yes I10 Essential (primary) hypertension 04/14/2021 No Yes I25.10 Atherosclerotic heart disease of native coronary artery without angina pectoris 04/14/2021 No Yes Z92.21 Personal history of antineoplastic chemotherapy 04/14/2021 No Yes Inactive Problems Resolved Problems ICD-10 Code Description Active Date Resolved Date S80.811A Abrasion, right lower leg, initial encounter 04/14/2021 04/14/2021 Electronic Signature(s) Signed: 07/21/2021 8:17:04 AM By: Worthy Keeler PA-C Entered By: Worthy Keeler on 07/21/2021 08:17:04 -------------------------------------------------------------------------------- Progress Note Details Patient Name: Date of Service: [name] Riley, Hag[place]. 07/21/2021 8:00 A M Medical Record Number: [id number] Patient Account Number: [id number] Date of Birth/Sex: Treating RN: [birth year] (70 y.o. [name] Riley Primary Care Provider: Other Clinician: [name] Referring Provider: Treating Provider/Extender: Vickii Penna in Treatment: 14 Subjective Chief Complaint Information obtained from Patient Right LE Ulcer and right forearm ulcer History of Present Illness (HPI) 04/14/2021 upon evaluation today patient presents for initial inspection here in the clinic concerning issues that she has been having with a wound on her right anterior lower leg after she had an abrasion trauma 4 weeks ago. She tells me that unfortunately following this trauma she has been experiencing quite a bit of discomfort at times with some burning and stinging. She is also had a scab up once or twice but states that right now she has been unable to really get it to scab back over. This has been a frustration for her and she is not really able to get it to heal as quickly and effectively as she would like to see. The patient also tells me that she has been tolerating the dressing changes without complication. She has just been using over-the-counter antibiotic ointment and try to keep it covered with a dry dressing at times depending on what she was told to do. Nonetheless right now she is also been on Keflex that did not really seem to help she was switched over to Augmentin she does started that on Friday. Currently I do not see anything that seems to be obviously infected but nonetheless I think it is fine for her to take the Augmentin just to make sure that we do not get anything that initiates in the interim. The patient is currently undergoing chemotherapy she has had previous radiation. Nonetheless in regard to the chemotherapy this could be affecting her ability to heal to some degree. 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 She has hypertension, neuropathy associated with the chemotherapy, and  a history of heart disease. She is  currently again undergoing active chemotherapy for recurrent breast cancer. 04/21/2021 upon evaluation today patient appears to be doing well with regard to her leg ulcer. This is actually looking significantly improved. There does not appear to be any signs of infection visually and I am happy in that regard. Fortunately there is no signs of systemic infection either which is also great news. No fevers, chills, nausea, vomiting, or diarrhea. 05/05/2021 upon evaluation today patient appears to be doing well with regard to her leg ulcer. She has been tolerating the dressing changes without complication. Fortunately there does not appear to be any signs of infection which is great news. No fevers, chills, nausea, vomiting, or diarrhea. 05/12/2021 upon evaluation today patient actually appears to be making good improvement in general. I am extremely pleased with where things stand today. There is no signs of active infection which is also great news. 05/19/2021 upon evaluation today patient appears to be doing well with regard to her wound. This is actually showing signs of excellent granulation epithelization at this point. I do not see any evidence of infection which is great and overall I am extremely pleased with where we stand today. In general I think that the patient is making leasing strides towards getting this healed. The issue that she had unfortunately her mother did pass this past week. She was at the wedding yesterday when she actually felt faint coming out of the church there is a lot of emotional situation involved obviously. Nonetheless she somewhat started to pass out her brothers caught her but she did hit her leg laterally underneath the wrap on the side of the steps. This caused a small skin tear and some contusion fortunately this is not too bad but nonetheless I do think that this needs to be monitored currently. 05/26/2021 upon evaluation today patient appears to be doing excellent in  regard to her wound on the leg. This is can require some sharp debridement but in general I feel like she is doing very well. The new skin tear that was noted last week is actually healing quite nicely reattached and has done great I think this will probably be pretty much resolved by next week and then should start just even an hour after that. As far as the color of the skin is concerned the bruising will fade is what I mean by that. 06/05/2021 upon evaluation today patient appears to be doing well with regard to her wound. She has been tolerating the dressing changes without complication. Fortunately there does not appear to be any signs of infection the good news is she is actually healing quite nicely and very close to resolution on the anterior wound and the new wound that was on the right last time where she had struck this during almost collapsing at her mom's funeral actually seems to be doing better although there is still a small opening here as well. 06/16/2021 upon evaluation today patient appears to be doing well with regard to her leg ulcer. She has been tolerating the dressing changes without complication. Fortunately there does not appear to be any signs of active infection at this time. No fevers, chills, nausea, vomiting, or diarrhea. 06/23/2021 upon evaluation today patient appears to be doing decently well in regard to her wounds. In fact everything appears to be completely healed based on what I see currently. Fortunately there does not appear to be any signs of active infection at this time. No  fevers, chills, nausea, vomiting, or diarrhea. Readmission: 07/14/2021 upon evaluation today patient actually appears to be doing decently well in regard to her original wound which again is completely healed at this point. Unfortunately she does have a blister on the medial aspect of her leg which occurred after she was advised to hold off on using her compression stockings due to the fact  she was having a lot of bruising. Fortunately though she has this blister there does not appear to be any signs of infection which is good news nonetheless I do believe she is going to require [name] to go back to utilizing the compression wrap to get this under control. She also has a skin tear on her forearm which is new as well. 07/21/2021 upon evaluation today patient's wound is actually showing signs of excellent improvement which is great news. I do not see any evidence of active infection at this time which is great news as well. Overall I think that she is really doing excellent. Objective Constitutional Well-nourished and well-hydrated in no acute distress. Vitals Time Taken: 8:01 AM, Height: 68 in, Weight: 157 lbs, BMI: 23.9, Temperature: 98.4 F, Pulse: 67 bpm, Respiratory Rate: 16 breaths/min, Blood Pressure: 125/75 mmHg. Respiratory normal breathing without difficulty. Psychiatric this patient is able to make decisions and demonstrates good insight into disease process. Alert and Oriented x 3. pleasant and cooperative. General Notes: upon inspection patient's wound bed actually showed signs of good granulation and epithelization at this point which is great news. I think that she is very close to complete resolution and I really do not see any major issues here as far as I am concerned. Overall the patient is extremely pleased with where she stands. Integumentary (Hair, Skin) Wound #3 status is Healed - Epithelialized. Original cause of wound was Trauma. The date acquired was: 07/03/2021. The wound has been in treatment 1 weeks. The wound is located on the Right Forearm. The wound measures 0cm length x 0cm width x 0cm depth; 0cm^2 area and 0cm^3 volume. Wound #4 status is Open. Original cause of wound was Blister. The date acquired was: 07/11/2021. The wound has been in treatment 1 weeks. The wound is located on the Right,Lateral Lower Leg. The wound measures 1cm length x 0.5cm width x  0.1cm depth; 0.393cm^2 area and 0.039cm^3 volume. There is Fat Layer (Subcutaneous Tissue) exposed. There is no tunneling or undermining noted. There is a medium amount of serosanguineous drainage noted. The wound margin is distinct with the outline attached to the wound base. There is large (67-100%) red granulation within the wound bed. There is no necrotic tissue within the wound bed. Assessment Active Problems ICD-10 Venous insufficiency (chronic) (peripheral) Non-pressure chronic ulcer of other part of right lower leg with fat layer exposed Laceration without foreign body of right forearm, initial encounter Other idiopathic peripheral autonomic neuropathy Secondary malignant neoplasm of breast Essential (primary) hypertension Atherosclerotic heart disease of native coronary artery without angina pectoris Personal history of antineoplastic chemotherapy Procedures Wound #4 Pre-procedure diagnosis of Wound #4 is a Venous Leg Ulcer located on the Right,Lateral Lower Leg . There was a Three Layer Compression Therapy Procedure by [name], RN. Post procedure Diagnosis Wound #4: Same as Pre-Procedure Plan Follow-up Appointments: Return Appointment in 2 weeks. - with Margarita Grizzle Nurse Visit: - Wednesday 07/28/21 : If wound healed may put on JuxtaLite, if not then re-wrap. Other: - Halo=Supplies Bathing/ Shower/ Hygiene: May shower with protection but do not get wound dressing(s) wet. Edema Control -  Lymphedema / SCD / Other: Elevate legs to the level of the heart or above for 30 minutes daily and/or when sitting, a frequency of: Avoid standing for long periods of time. Patient to wear own compression stockings every day. - T left leg o Additional Orders / Instructions: Follow Nutritious Diet Non Wound Condition: Apply the following to affected area as directed: - Sarna Anti Itch Lotion or generic lotion with Pramoxine Hydrochloride WOUND #4: - Lower Leg Wound Laterality: Right,  Lateral Cleanser: Soap and Water 1 x Per Week/15 Days Discharge Instructions: May shower and wash wound with dial antibacterial soap and water prior to dressing change. Peri-Wound Care: Sween Lotion (Moisturizing lotion) 1 x Per Week/15 Days Discharge Instructions: Apply moisturizing lotion as directed Prim Dressing: Xeroform Occlusive Gauze Dressing, 4x4 in 1 x Per Week/15 Days ary Discharge Instructions: Apply to wound bed as instructed Secondary Dressing: ABD Pad, 5x9 1 x Per Week/15 Days Discharge Instructions: Apply over primary dressing as directed. Com pression Wrap: ThreePress (3 layer compression wrap) 1 x Per Week/15 Days Discharge Instructions: Apply three layer compression as directed. Com pression Stockings: Circaid Juxta Lite Compression Wrap Compression Amount: 30-40 mmHg (right) Discharge Instructions: Apply Circaid Juxta Lite Compression Wrap daily as instructed. Apply first thing in the morning, remove at night before bed. 1. Would recommend currently that we going continue with the wound care measures as before and the patient is in agreement with plan. This includes the use of Xeroform to the wound bed which I think is doing an awesome job. 2. I am also can recommend that we have the patient continue with the 3 layer compression wrap which I think is doing awesome job. 3. I would also suggest that she continue to elevate her legs much as possible. 4. With regard to next week since she will be doing a nurse visit next week I did explain that I believe if she is completely healed that she can definitely go ahead and proceed with being placed into the juxta light compression wrap. Obviously she will need to come back the following week or discharge if she remains healed but at the same time I do not believe we need to actually wrap her if the wound is sealed up and completely closed which I fully expect. She voiced understanding and agreement. We will see patient back for  reevaluation in 1 week here in the clinic. If anything worsens or changes patient will contact our office for additional recommendations. Electronic Signature(s) Signed: 07/21/2021 9:17:07 AM By: Worthy Keeler PA-C Entered By: Worthy Keeler on 07/21/2021 09:17:06 -------------------------------------------------------------------------------- SuperBill Details Patient Name: Date of Service: [name] Riley, [name] 07/21/2021 Medical Record Number: [id number] Patient Account Number: [id number] Date of Birth/Sex: Treating RN: Jan 27, 1951 (70 y.o. 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 C79.81 Secondary malignant neoplasm of breast I10 Essential (primary) hypertension I25.10 Atherosclerotic heart disease of native coronary artery without angina pectoris Z92.21 Personal history of antineoplastic chemotherapy Facility Procedures CPT4 Code: [id number] Description: (Facility Use Only) [id number] - [place] LWR RT LEG ICD-10 Diagnosis Description 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 Non-pressure chronic ulcer of other part of right lower leg with fat  layer exposed Modifier: Quantity: 1 Physician Procedures : CPT4 Code Description Modifier [id number] - WC PHYS LEVEL 3 - EST PT ICD-10 Diagnosis Description I87.2 Venous insufficiency (chronic) (peripheral) L97.812 Non-pressure chronic ulcer of other part of right lower leg with fat layer exposed S51.811A  Laceration without foreign body of right forearm, initial encounter G90.09 Other idiopathic peripheral autonomic  neuropathy Quantity: 1 Electronic Signature(s) Signed: 07/21/2021 9:17:25 AM By: Worthy Keeler PA-C Entered By: Worthy Keeler on 07/21/2021 09:17:24

## 2021-07-22 NOTE — Progress Notes (Signed)
SHAVONE, NEVERS ([id number]) Visit Report for 07/21/2021 Arrival Information Details Patient Name: Date of Service: MINDEE, ROBLEDO A. 07/21/2021 8:00 A M Medical Record Number: [id number] Patient Account Number: [id number] Date of Birth/Sex: Treating RN: April 09, 1951 (70 y.o. [name] Primary Care Jearld Hemp: [name] Other Clinician: Referring Lavayah Vita: Treating Anala Whisenant/Extender: Vickii Penna in Treatment: 47 Visit Information History Since Last Visit Added or deleted any medications: No Patient Arrived: Ambulatory Any new allergies or adverse reactions: No Arrival Time: 07:58 Had a fall or experienced change in No Transfer Assistance: None activities of daily living that may affect Patient Identification Verified: Yes risk of falls: Secondary Verification Process Completed: Yes Signs or symptoms of abuse/neglect since last visito No Patient Requires Transmission-Based Precautions: No Hospitalized since last visit: No Patient Has Alerts: No Implantable device outside of the clinic excluding No cellular tissue based products placed in the center since last visit: Has Dressing in Place as Prescribed: Yes Has Compression in Place as Prescribed: Yes Pain Present Now: No Electronic Signature(s) Signed: 07/22/2021 5:58:36 PM By: [name] Entered By: [name] on 07/21/2021 08:01:25 -------------------------------------------------------------------------------- Compression Therapy Details Patient Name: Date of Service: [place], [place]. 07/21/2021 8:00 A M Medical Record Number: [id number] Patient Account Number: [id number] Date of Birth/Sex: Treating RN: 05-13-1951 (70 y.o. [name] Primary Care Maurisa Tesmer: [name] Other Clinician: Referring Josselyne Onofrio: Treating Ellaree Gear/Extender: Vickii Penna in Treatment: 14 Compression Therapy Performed for Wound Assessment: Wound #4 Right,Lateral Lower  Leg Performed By: Clinician [name], RN Compression Type: Three Layer Post Procedure Diagnosis Same as Pre-procedure Electronic Signature(s) Signed: 07/22/2021 5:58:36 PM By: [name] Entered By: [name] on 07/21/2021 08:36:30 -------------------------------------------------------------------------------- Encounter Discharge Information Details Patient Name: Date of Service: [place], [name] 07/21/2021 8:00 A M Medical Record Number: [id number] Patient Account Number: [id number] Date of Birth/Sex: Treating RN: 1951-07-28 (70 y.o. [name] Primary Care Ember Henrikson: [name] Other Clinician: Referring Balraj Brayfield: Treating Armani Brar/Extender: Vickii Penna in Treatment: 14 Encounter Discharge Information Items Discharge Condition: Stable Ambulatory Status: Ambulatory Discharge Destination: Home Transportation: Private Auto Schedule Follow-up Appointment: Yes Clinical Summary of Care: Provided on 07/21/2021 Form Type Recipient Paper Patient Patient Electronic Signature(s) Signed: 07/22/2021 5:58:36 PM By: [name] Entered By: [name] on 07/21/2021 08:56:47 -------------------------------------------------------------------------------- Lower Extremity Assessment Details Patient Name: Date of Service: [place], [name] 07/21/2021 8:00 A M Medical Record Number: [id number] Patient Account Number: [id number] Date of Birth/Sex: Treating RN: [birth year] (70 y.o. [name] Primary Care Theophilus Walz: [name] Other Clinician: Referring Manasseh Pittsley: Treating Tien Aispuro/Extender: Vickii Penna in Treatment: 14 Edema Assessment Assessed: Shirlyn Goltz: No] Patrice Paradise: Yes] Edema: [Left: N] [Right: o] Calf Left: Right: Point of Measurement: 29 cm From Medial Instep 30.5 cm Ankle Left: Right: Point of Measurement: 10 cm From Medial Instep 20.8 cm Knee To Floor Left: Right: From Medial Instep 47  cm Vascular Assessment Pulses: Dorsalis Pedis Palpable: [Right:Yes] Electronic Signature(s) Signed: 07/21/2021 10:55:46 AM By: [name] Entered By: [name] on 07/21/2021 10:55:45 -------------------------------------------------------------------------------- Multi-Disciplinary Care Plan Details Patient Name: Date of Service: [place], [name] 07/21/2021 8:00 A M Medical Record Number: [id number] Patient Account Number: [id number] Date of Birth/Sex: Treating RN: [birth year] (70 y.o. [name] Primary Care Kiahna Banghart: [name] Other Clinician: Referring Paulmichael Schreck: Treating Raheen Capili/Extender: Vickii Penna in Treatment: [place] reviewed with physician Active Inactive Wound/Skin  Impairment Nursing Diagnoses: Impaired tissue integrity Knowledge deficit related to ulceration/compromised skin integrity Goals: Patient/caregiver will verbalize understanding of skin care regimen Date Initiated: 04/14/2021 Target Resolution Date: 08/11/2021 Goal Status: Active Ulcer/skin breakdown will have a volume reduction of 30% by week 4 Date Initiated: 04/14/2021 Target Resolution Date: 08/11/2021 Goal Status: Active Interventions: Assess patient/caregiver ability to obtain necessary supplies Assess patient/caregiver ability to perform ulcer/skin care regimen upon admission and as needed Assess ulceration(s) every visit Provide education on ulcer and skin care Treatment Activities: Skin care regimen initiated : 04/14/2021 Topical wound management initiated : 04/14/2021 Notes: Electronic Signature(s) Signed: 07/22/2021 5:58:36 PM By: [name] Entered By: [name] on 07/21/2021 07:58:01 -------------------------------------------------------------------------------- Pain Assessment Details Patient Name: Date of Service: [place], [name] 07/21/2021 8:00 A M Medical Record Number: [id number] Patient Account  Number: [id number] Date of Birth/Sex: Treating RN: 21-Dec-1950 (70 y.o. [name] Primary Care Alilah Mcmeans: [name] Other Clinician: Referring Marks Scalera: Treating Mckyle Solanki/Extender: Vickii Penna in Treatment: 14 Active Problems Location of Pain Severity and Description of Pain Patient Has Paino No Patient Has Paino No Site Locations Pain Management and Medication Current Pain Management: Electronic Signature(s) Signed: 07/22/2021 5:58:36 PM By: [name] Entered By: [name] on 07/21/2021 08:02:18 -------------------------------------------------------------------------------- Patient/Caregiver Education Details Patient Name: Date of Service: [place], [name] 10/5/2022andnbsp8:00 A M Medical Record Number: [id number] Patient Account Number: [id number] Date of Birth/Gender: Treating RN: 1951/05/19 (70 y.o. [name] Primary Care Physician: [name] Other Clinician: Referring Physician: Treating Physician/Extender: Vickii Penna in Treatment: 14 Education Assessment Education Provided To: Patient Education Topics Provided Venous: Methods: Explain/Verbal, Printed Responses: State content correctly Wound/Skin Impairment: Methods: Explain/Verbal, Printed Responses: State content correctly Electronic Signature(s) Signed: 07/22/2021 5:58:36 PM By: [name] Entered By: [name] on 07/21/2021 07:58:19 -------------------------------------------------------------------------------- Wound Assessment Details Patient Name: Date of Service: [place], [name] 07/21/2021 8:00 A M Medical Record Number: [id number] Patient Account Number: [id number] Date of Birth/Sex: Treating RN: [birth year] (70 y.o. [name] Primary Care Keon Pender: [name] Other Clinician: Referring Tempestt Silba: Treating Doniqua Saxby/Extender: Vickii Penna in Treatment: 14 Wound  Status Wound Number: 3 Primary Trauma, Other Etiology: Wound Location: Right Forearm Wound Healed - Epithelialized Wounding Event: Trauma Status: Date Acquired: 07/03/2021 Comorbid Coronary Artery Disease, Hypertension, Myocardial Infarction, Weeks Of Treatment: 1 History: Neuropathy, Received Chemotherapy, Received Radiation Clustered Wound: No Photos Wound Measurements Length: (cm) Width: (cm) Depth: (cm) Area: (cm) Volume: (cm) 0 % Reduction in Area: 100% 0 % Reduction in Volume: 100% 0 0 0 Wound Description Classification: Full Thickness Without Exposed Support Structur es Electronic Signature(s) Signed: 07/22/2021 5:58:36 PM By: [name] Entered By: [name] on 07/21/2021 08:13:44 -------------------------------------------------------------------------------- Wound Assessment Details Patient Name: Date of Service: [place], [name] 07/21/2021 8:00 A M Medical Record Number: [id number] Patient Account Number: [id number] Date of Birth/Sex: Treating RN: Sep 14, 1951 (70 y.o. [name] Primary Care Refoel Palladino: [name] Other Clinician: Referring Filimon Miranda: Treating Keen Ewalt/Extender: Vickii Penna in Treatment: 14 Wound Status Wound Number: 4 Primary Venous Leg Ulcer Etiology: Wound Location: Right, Lateral Lower Leg Wound Open Wounding Event: Blister Status: Date Acquired: 07/11/2021 Comorbid Coronary Artery Disease, Hypertension, Myocardial Infarction, Weeks Of Treatment: 1 History: Neuropathy, Received Chemotherapy, Received Radiation Clustered Wound: No Photos Wound Measurements Length: (cm) 1 Width: (cm) 0.5 Depth: (cm) 0.1 Area: (cm) 0.393 Volume: (cm) 0.039 % Reduction in Area: 90% % Reduction in  Volume: 90.1% Epithelialization: Large (67-100%) Tunneling: No Undermining: No Wound Description Classification: Full Thickness Without Exposed Support Structures Wound Margin: Distinct, outline  attached Exudate Amount: Medium Exudate Type: Serosanguineous Exudate Color: red, brown Foul Odor After Cleansing: No Slough/Fibrino No Wound Bed Granulation Amount: Large (67-100%) Exposed Structure Granulation Quality: Red Fascia Exposed: No Necrotic Amount: None Present (0%) Fat Layer (Subcutaneous Tissue) Exposed: Yes Tendon Exposed: No Muscle Exposed: No Joint Exposed: No Bone Exposed: No Treatment Notes Wound #4 (Lower Leg) Wound Laterality: Right, Lateral Cleanser Soap and Water Discharge Instruction: May shower and wash wound with dial antibacterial soap and water prior to dressing change. Peri-Wound Care Sween Lotion (Moisturizing lotion) Discharge Instruction: Apply moisturizing lotion as directed Topical Primary Dressing Xeroform Occlusive Gauze Dressing, 4x4 in Discharge Instruction: Apply to wound bed as instructed Secondary Dressing ABD Pad, 5x9 Discharge Instruction: Apply over primary dressing as directed. Secured With Compression Wrap ThreePress (3 layer compression wrap) Discharge Instruction: Apply three layer compression as directed. Compression Stockings Circaid Juxta Lite Compression Wrap Quantity: 1 Right Leg Compression Amount: 30-40 mmHg Discharge Instruction: Apply Circaid Juxta Lite Compression Wrap daily as instructed. Apply first thing in the morning, remove at night before bed. Add-Ons Electronic Signature(s) Signed: 07/22/2021 5:58:36 PM By: [name] Signed: 07/22/2021 5:58:36 PM By: [name] Entered By: [name] on 07/21/2021 08:14:23 -------------------------------------------------------------------------------- Vitals Details Patient Name: Date of Service: [place], [name] 07/21/2021 8:00 A M Medical Record Number: [id number] Patient Account Number: [id number] Date of Birth/Sex: Treating RN: 07/02/1951 (70 y.o. [name] Primary Care Laniece Hornbaker: [name] Other Clinician: Referring  Arian Mcquitty: Treating Lamia Mariner/Extender: Vickii Penna in Treatment: 14 Vital Signs Time Taken: 08:01 Temperature (F): 98.4 Height (in): 68 Pulse (bpm): 67 Weight (lbs): 157 Respiratory Rate (breaths/min): 16 Body Mass Index (BMI): 23.9 Blood Pressure (mmHg): 125/75 Reference Range: 80 - 120 mg / dl Electronic Signature(s) Signed: 07/22/2021 5:58:36 PM By: [name] Entered By: [name] on 07/21/2021 08:01:43

## 2021-07-23 ENCOUNTER — Other Ambulatory Visit: Payer: Medicare Other

## 2021-07-23 ENCOUNTER — Ambulatory Visit: Payer: Medicare Other | Admitting: Hematology and Oncology

## 2021-07-23 ENCOUNTER — Ambulatory Visit: Payer: Medicare Other

## 2021-07-26 ENCOUNTER — Other Ambulatory Visit (HOSPITAL_COMMUNITY): Payer: Self-pay

## 2021-07-28 ENCOUNTER — Encounter (HOSPITAL_BASED_OUTPATIENT_CLINIC_OR_DEPARTMENT_OTHER): Payer: Medicare Other | Admitting: Internal Medicine

## 2021-07-28 ENCOUNTER — Other Ambulatory Visit: Payer: Self-pay

## 2021-07-28 DIAGNOSIS — I872 Venous insufficiency (chronic) (peripheral): Secondary | ICD-10-CM | POA: Diagnosis not present

## 2021-07-28 NOTE — Progress Notes (Signed)
MARUA, QIN ([id number]) Visit Report for 07/28/2021 SuperBill Details Patient Name: Date of Service: [name] Riley, [name] Riley 07/28/2021 Medical Record Number: [id number] Patient Account Number: [id number] Date of Birth/Sex: Treating RN: 04/02/1951 (70 y.o. 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

## 2021-07-28 NOTE — Progress Notes (Signed)
[name] Riley, [name] Riley ([id number]) Visit Report for 07/28/2021 Arrival Information Details Patient Name: Date of Service: [name] Riley, [name] Riley 07/28/2021 8:45 A M Medical Record Number: [id number] Patient Account Number: [id number] Date of Birth/Sex: Treating RN: 10/21/1950 (70 y.o. [name] Primary Care Arabela Basaldua: [name] Other Clinician: Referring Arcadia Gorgas: Treating Tayshon Winker/Extender: Neysa Hotter in Treatment: 81 Visit Information History Since Last Visit Added or deleted any medications: No Patient Arrived: Ambulatory Any new allergies or adverse reactions: No Arrival Time: 08:36 Had a fall or experienced change in No Accompanied By: self activities of daily living that may affect Transfer Assistance: None risk of falls: Patient Identification Verified: Yes Signs or symptoms of abuse/neglect since last visito No Secondary Verification Process Completed: Yes Hospitalized since last visit: No Patient Requires Transmission-Based Precautions: No Implantable device outside of the clinic excluding No Patient Has Alerts: No cellular tissue based products placed in the center since last visit: Has Dressing in Place as Prescribed: Yes Has Compression in Place as Prescribed: Yes Pain Present Now: No Notes pt removed coban secondary to pain Electronic Signature(s) Signed: 07/28/2021 4:40:08 PM By: Baruch Gouty RN, BSN Entered By: Baruch Gouty on 07/28/2021 08:42:07 -------------------------------------------------------------------------------- Compression Therapy Details Patient Name: Date of Service: [name] Riley, [place]. 07/28/2021 8:45 A M Medical Record Number: [id number] Patient Account Number: [id number] Date of Birth/Sex: Treating RN: [birth year] (70 y.o. [name] Primary Care Trayvion Embleton: [name] Other Clinician: Referring Janely Gullickson: Treating Jedidiah Demartini/Extender: Neysa Hotter in Treatment:  15 Compression Therapy Performed for Wound Assessment: Wound #4 Right,Lateral Lower Leg Performed By: Clinician Baruch Gouty, RN Compression Type: Three Hydrologist) Signed: 07/28/2021 4:40:08 PM By: Baruch Gouty RN, BSN Entered By: Baruch Gouty on 07/28/2021 08:56:44 -------------------------------------------------------------------------------- Encounter Discharge Information Details Patient Name: Date of Service: [name] Riley, [name]. 07/28/2021 8:45 A M Medical Record Number: [id number] Patient Account Number: [id number] Date of Birth/Sex: Treating RN: 09/30/1951 (70 y.o. [name] Primary Care Sahara Fujimoto: [name] Other Clinician: Referring Tresean Mattix: Treating Bently Wyss/Extender: Neysa Hotter in Treatment: 15 Encounter Discharge Information Items Discharge Condition: Stable Ambulatory Status: Ambulatory Discharge Destination: Home Transportation: Private Auto Accompanied By: self Schedule Follow-up Appointment: Yes Clinical Summary of Care: Patient Declined Electronic Signature(s) Signed: 07/28/2021 4:40:08 PM By: Baruch Gouty RN, BSN Entered By: Baruch Gouty on 07/28/2021 08:58:16 -------------------------------------------------------------------------------- Patient/Caregiver Education Details Patient Name: Date of Service: [name] Merlin A. 10/12/2022andnbsp8:45 [place] Record Number: [id number] Patient Account Number: [id number] Date of Birth/Gender: Treating RN: Jan 05, 1951 (70 y.o. [name] Primary Care Physician: [name] Other Clinician: Referring Physician: Treating Physician/Extender: Neysa Hotter in Treatment: 15 Education Assessment Education Provided To: Patient Education Topics Provided Wound/Skin Impairment: Methods: Explain/Verbal Responses: Reinforcements needed, State content correctly [employer]) Signed: 07/28/2021  4:40:08 PM By: Baruch Gouty RN, BSN Entered By: Baruch Gouty on 07/28/2021 08:57:26 -------------------------------------------------------------------------------- Wound Assessment Details Patient Name: Date of Service: [name] Riley, [place]. 07/28/2021 8:45 A M Medical Record Number: [id number] Patient Account Number: [id number] Date of Birth/Sex: Treating RN: 1951-08-22 (70 y.o. [name] Primary Care Debarah Mccumbers: [name] Other Clinician: Referring Alka Falwell: Treating Demarco Bacci/Extender: Neysa Hotter in Treatment: 15 Wound Status Wound Number: 4 Primary Venous Leg Ulcer Etiology: Wound Location: Right, Lateral Lower Leg Wound Open Wounding Event: Blister Status: Date Acquired: 07/11/2021 Comorbid Coronary Artery Disease, Hypertension, Myocardial Infarction, Weeks Of Treatment: 2 History: Neuropathy, Received Chemotherapy, Received Radiation Clustered Wound: No  Wound Measurements Length: (cm) 0.1 Width: (cm) 0.1 Depth: (cm) 0.1 Area: (cm) 0.008 Volume: (cm) 0.001 % Reduction in Area: 99.8% % Reduction in Volume: 99.7% Epithelialization: Large (67-100%) Tunneling: No Undermining: No Wound Description Classification: Full Thickness Without Exposed Support Structures Wound Margin: Distinct, outline attached Exudate Amount: Small Exudate Type: Serous Exudate Color: amber Foul Odor After Cleansing: No Slough/Fibrino No Wound Bed Granulation Amount: None Present (0%) Exposed Structure Necrotic Amount: None Present (0%) Fascia Exposed: No Fat Layer (Subcutaneous Tissue) Exposed: No Tendon Exposed: No Muscle Exposed: No Joint Exposed: No Bone Exposed: No Treatment Notes Wound #4 (Lower Leg) Wound Laterality: Right, Lateral Cleanser Soap and Water Discharge Instruction: May shower and wash wound with dial antibacterial soap and water prior to dressing change. Peri-Wound Care Sween Lotion (Moisturizing lotion) Discharge  Instruction: Apply moisturizing lotion as directed Topical Primary Dressing Xeroform Occlusive Gauze Dressing, 4x4 in Discharge Instruction: Apply to wound bed as instructed Secondary Dressing Woven Gauze Sponge, Non-Sterile 4x4 in Discharge Instruction: Apply over primary dressing as directed. Secured With Compression Wrap ThreePress (3 layer compression wrap) Discharge Instruction: Apply three layer compression as directed. Compression Stockings Circaid Juxta Lite Compression Wrap Quantity: 1 Right Leg Compression Amount: 30-40 mmHg Discharge Instruction: Apply Circaid Juxta Lite Compression Wrap daily as instructed. Apply first thing in the morning, remove at night before bed. Add-Ons Electronic Signature(s) Signed: 07/28/2021 4:40:08 PM By: Baruch Gouty RN, BSN Entered By: Baruch Gouty on 07/28/2021 08:56:22 -------------------------------------------------------------------------------- [place] Details Patient Name: Date of Service: [name] Riley, [place]. 07/28/2021 8:45 A M Medical Record Number: [id number] Patient Account Number: [id number] Date of Birth/Sex: Treating RN: Sep 26, 1951 (70 y.o. [name] Primary Care Cher Egnor: [name] Other Clinician: Referring Kayonna Lawniczak: Treating Taline Nass/Extender: Neysa Hotter in Treatment: 15 Vital Signs Time Taken: 08:42 Temperature (F): 98.5 Height (in): 68 Pulse (bpm): 91 Source: Stated Respiratory Rate (breaths/min): 18 Weight (lbs): 157 Blood Pressure (mmHg): 145/80 Source: Stated Reference Range: 80 - 120 mg / dl Body Mass Index (BMI): 23.9 Electronic Signature(s) Signed: 07/28/2021 4:40:08 PM By: Baruch Gouty RN, BSN Entered By: Baruch Gouty on 07/28/2021 08:42:37

## 2021-07-28 NOTE — Progress Notes (Signed)
Patient Care Team: [name], MD as PCP - General (Family Medicine) Minus Breeding, MD as PCP - Cardiology (Cardiology) Nicholas Lose, MD as Consulting Physician (Hematology and Oncology)  DIAGNOSIS:    ICD-10-CM   1. Malignant neoplasm of upper-outer quadrant of left breast in female, estrogen receptor positive ([place])  C50.412    Z17.0       SUMMARY OF ONCOLOGIC HISTORY: Oncology History  Breast cancer of upper-outer quadrant of left female breast ([place])  11/14/2011 Surgery   Bilateral mastectomy, prophylactic on the right, left breast IDC 3/18 lymph nodes positive with extracapsular extension ER 89%, PR 81%, HER-2 negative, Ki-67 79% T2 N1 A. stage IIB   12/13/2011 - 06/28/2012 Chemotherapy   4 cycles of FEC followed by 4 cycles of Taxotere   07/17/2012 - 08/22/2012 Radiation Therapy   Adjuvant radiation therapy   08/22/2012 - 03/16/2016 Anti-estrogen oral therapy   Arimidex 1 mg daily   03/16/2016 Relapse/Recurrence   Subcutaneous nodule excision left chest: Infiltrating carcinoma breast primary, ER positive, PR negative   03/29/2016 Imaging   CT CAP and bone scan: Lytic lesions T8 vertebral, T1 posterior element, subcutaneous nodule left lateral chest wall, nonspecific lung nodules; Bone scan: Mets to kull, left humerus, left eighth rib, T7/T8, sternum, left acetabulum   04/28/2016 - 06/17/2017 Chemotherapy   Herceptin, lapatinib, Faslodex, Zometa every 4 weeks, lapatinib discontinued in September 2018 due to elevation of LFTs   06/22/2017 Relapse/Recurrence   Surgical excision:Soft tissue mass left lateral chest wall primary breast cancer, soft tissue mass left medial chest wall breast cancer, tumor is within the dermis extending to the subcutaneous adipose tissue and involves portions of skeletal muscle   06/22/2017 Cancer Staging   Staging form: Breast, AJCC 7th Edition - Pathologic stage from 06/22/2017: Stage IV (TX, NX, M1) - Signed by Nicholas Lose, MD on 12/27/2019    08/2017 - 02/16/2018 Chemotherapy   Faslodex with Herceptin and Perjeta along with Zometa every 4 weeks   03/02/2018 - 05/25/2018 Chemotherapy   Kadcyla   05/11/2018 Imaging   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 Associated vasogenic edema within the underlying right cerebral hemisphere without significant midline shift. Signal abnormality throughout the visualized bone marrow, compatible with osseous metastatic disease.    06/04/2018 Imaging   CT CAP: Right lower lobe lung nodule 7 mm (was 5 mm); multiple bone metastases throughout the spine and ribs sternum scapula and humerus, slightly increased lower thoracic mets, right renal lesion 2.5 cm (was 1.2 cm) right femur met increased from 2.1 cm to 2.9 cm   06/15/2018 -  Anti-estrogen oral therapy   Abemaciclib, Herceptin, letrozole, Xgeva   06/07/2019 Imaging   Progression of brain metastases,2 discrete dural-based lesions involving the posterior right frontal lobe. The more anterior lesion now measures 16.5 x 17.5 x 14 mm. The posterior lesion now measures 9.5 x 13 x 20 mm.  Vasogenic edema of the frontal and parietal lobes   06/21/2019 - 06/27/2019 Radiation Therapy   SRS to the brain   07/19/2020 Relapse/Recurrence   Left mastectomy scar nodule: Biopsy invasive carcinoma ER 60%, PR 0%, HER-2 negative [facility]   07/29/2020 Surgery   Soft tissue mass left chest wall excision: No evidence of malignancy.   11/27/2020 - 02/05/2021 Chemotherapy      Patient is on Antibody Plan: BREAST ADO-TRASTUZUMAB EMTANSINE [facility]) Q21D     01/15/2021 -  Chemotherapy   Patient is on Treatment Plan : BREAST ADO-Trastuzumab Emtansine (Kadcyla) q21d  Metastatic breast cancer ([place])  06/29/2017 Initial Diagnosis   Metastatic breast cancer ([place])   06/15/2018 - 12/25/2020 Chemotherapy    Patient is on Treatment Plan: BRAIN GBM BEVACIZUMAB 14D X 6 CYCLES       01/15/2021 -  Chemotherapy   Patient is on Treatment Plan : BREAST  ADO-Trastuzumab Emtansine (Kadcyla) q21d     Brain metastasis ([place])  07/13/2018 Initial Diagnosis   Brain metastasis ([place])   11/27/2020 - 02/05/2021 Chemotherapy      Patient is on Antibody Plan: BREAST ADO-TRASTUZUMAB EMTANSINE (KADCYLA) Q21D       CHIEF COMPLIANT: Cycle 10 Kadcyla  INTERVAL HISTORY: [name] Riley is a 70 y.o. with above-mentioned history of metastatic breast cancer currently on abemaciclib with letrozole along with Herceptin, Avastin, and Xgeva. She presents to the clinic today for cycle 10 of Kadcyla.   ALLERGIES:  is allergic to cantaloupe extract allergy skin test, contrast media [iodinated diagnostic agents], pravastatin, zosyn [piperacillin sod-tazobactam so], and latex.  MEDICATIONS:  Current Outpatient Medications  Medication Sig Dispense Refill   abemaciclib (VERZENIO) 50 MG tablet Take 1 tablet (50 mg total) by mouth 2 (two) times daily. 56 tablet 3   acetaminophen (TYLENOL) 500 MG tablet Take 1,000 mg by mouth every 6 (six) hours as needed for moderate pain.     Ascorbic Acid (VITAMIN C PO) Take by mouth.     cholecalciferol (VITAMIN D) 1000 units tablet Take 1 tablet (1,000 Units total) by mouth daily.     Docusate Sodium (DSS) 100 MG CAPS Take by mouth.     furosemide (LASIX) 20 MG tablet TAKE ONE (1) TABLET BY MOUTH EVERY DAY 30 tablet 1   letrozole (FEMARA) 2.5 MG tablet TAKE ONE (1) TABLET EACH DAY 90 tablet 3   levETIRAcetam (KEPPRA) 500 MG tablet TAKE ONE TABLET BY MOUTH TWICE DAILY 60 tablet 3   levofloxacin (LEVAQUIN) 500 MG tablet Take 1 tablet (500 mg total) by mouth daily. 7 tablet 0   levothyroxine (SYNTHROID) 100 MCG tablet TAKE ONE TABLET EACH MORNING BEFORE BREAKFAST 90 tablet 2   lidocaine-prilocaine (EMLA) cream APPLY TOPICALLY AS NEEDED FOR PORT ACCESS 30 g 3   metoprolol succinate (TOPROL-XL) 100 MG 24 hr tablet TAKE ONE (1) TABLET EACH DAY 90 tablet 0   Multiple Vitamins-Minerals (MULTIVITAMIN WITH MINERALS) tablet Take 1 tablet by  mouth daily.     ondansetron (ZOFRAN-ODT) 4 MG disintegrating tablet TAKE 1 TABLET EVERY 6 HOURS AS NEEDED FOR NAUSEA AND VOMITING 30 tablet 3   potassium chloride SA (KLOR-CON) 20 MEQ tablet Take 1 tablet (20 mEq total) by mouth 2 (two) times daily for 7 days. 14 tablet 0   predniSONE (DELTASONE) 50 MG tablet Patient to take 13 hours, 7 hours, and 1 hour prior to CT scan (Patient not taking: Reported on 06/10/2021) 3 tablet 0   rosuvastatin (CRESTOR) 5 MG tablet Take 1 tablet (5 mg total) by mouth daily. (new directions) 90 tablet 1   sulfamethoxazole-trimethoprim (BACTRIM DS) 800-160 MG tablet Take 1 tablet by mouth 2 (two) times daily. 10 tablet 0   triamcinolone (KENALOG) 0.025 % ointment Apply 1 application topically 2 (two) times daily. 80 g 1   valsartan (DIOVAN) 80 MG tablet TAKE ONE (1) TABLET EACH DAY 90 tablet 1   No current facility-administered medications for this visit.   Facility-Administered Medications Ordered in Other Visits  Medication Dose Route Frequency Provider Last Rate Last Admin   sodium chloride flush (NS) 0.9 % injection 10  mL  10 mL Intracatheter PRN Nicholas Lose, MD   10 mL at 05/03/21 1653    PHYSICAL EXAMINATION: ECOG PERFORMANCE STATUS: 1 - Symptomatic but completely ambulatory  Vitals:   07/30/21 0923  BP: 131/71  Pulse: 81  Resp: 18  Temp: (!) 97.5 F (36.4 C)  SpO2: 100%   Filed Weights   07/30/21 0923  Weight: 134 lb 4.8 oz (60.9 kg)    LABORATORY DATA:  I have reviewed the data as listed CMP Latest Ref Rng & Units 07/02/2021 06/11/2021 05/21/2021  Glucose 70 - 99 mg/dL 93 108(H) 102(H)  BUN 8 - 23 mg/dL 14 13 12   Creatinine 0.44 - 1.00 mg/dL 0.75 0.83 0.82  Sodium 135 - 145 mmol/L 137 138 138  Potassium 3.5 - 5.1 mmol/L 3.3(L) 3.1(L) 3.2(L)  Chloride 98 - 111 mmol/L 101 101 101  CO2 22 - 32 mmol/L 27 28 26   Calcium 8.9 - 10.3 mg/dL 9.5 9.4 9.4  Total Protein 6.5 - 8.1 g/dL 6.5 6.5 6.6  Total Bilirubin 0.3 - 1.2 mg/dL 1.0 1.0 1.0   Alkaline Phos 38 - 126 U/L 253(H) 236(H) 266(H)  AST 15 - 41 U/L 62(H) 53(H) 67(H)  ALT 0 - 44 U/L 25 21 26     Lab Results  Component Value Date   WBC 3.5 (L) 07/30/2021   HGB 10.7 (L) 07/30/2021   HCT 31.7 (L) 07/30/2021   MCV 109.3 (H) 07/30/2021   PLT 105 (L) 07/30/2021   NEUTROABS 1.6 (L) 07/30/2021    ASSESSMENT & PLAN:  Breast cancer of upper-outer quadrant of left female breast ([place]) Current Treatment: Kadcyla q [redacted] weeks along with Verzenio and letrozole. (she completed bevacizumab x4) (Prior treatment abemaciclib with letrozole and Herceptin)  Biopsy of the liver lesion.  12/11/2020: Metastatic carcinoma, ER 75% week to moderate, PR 0%, Ki-67 25%, HER-2 equivocal by IHC, FISH positive ratio 2.97, copy #5.2  Kadcyla toxicities:  1.  Fatigue as a result of chemotherapy: Stable 2. chemo induced anemia: Monitoring closely, today's hemoglobin is 11.3 3. peripheral neuropathy: Because of worsening peripheral neuropathy, we will infuse Kadcyla every 4 weeks and also change Verzenio dosage to 50 mg twice daily for next month onwards.  We will further reduce the dosage of Kadcyla starting 07/30/2021 If the neuropathy continues to get worse, we may have to stop Kadcyla in December after the scan and leave her on Verzenio.  4.  Emergency room visit for leg discomfort: Hypokalemic sent home with potassium replacement therapy 5.  Leukopenia: Related to Verzenio, stable 6.  Thrombocytopenia: Platelets 105   Nonhealing wound on the right leg: Wound care is assisting her Easy bruising on the hands: Possibly due to thrombocytopenia   Abemaciclib toxicities: denies diarrhea, Complains of more fatigue Because of neuropathy reduce the dosage of Verzenio to 50 mg p.o. twice daily.  Starting in October 2022.   CT CAP 04/02/21: Unchanged  Hepatic and osseous disease, next scans will be done in December 2022 MRI brain 06/04/2021: Stable, no interval change in the ill-defined areas of enhancement  in the right frontal and parietal lobes, unchanged right cerebral hemisphere and unchanged dural based nodular enhancement over right anterior temporal lobe, unchanged bone metastases   Return to clinic every 4 weeks for Kadcyla Next CT scan will be done in December    No orders of the defined types were placed in this encounter.  The patient has a good understanding of the overall plan. she agrees with it. she will call  with any problems that may develop before the next visit here.  Total time spent: 30 mins including face to face time and time spent for planning, charting and coordination of care  Rulon Eisenmenger, MD, MPH 07/30/2021  I, Thana Ates, am acting as scribe for Dr. Nicholas Lose.  I have reviewed the above documentation for accuracy and completeness, and I agree with the above.

## 2021-07-30 ENCOUNTER — Inpatient Hospital Stay (HOSPITAL_BASED_OUTPATIENT_CLINIC_OR_DEPARTMENT_OTHER): Payer: Medicare Other | Admitting: Hematology and Oncology

## 2021-07-30 ENCOUNTER — Other Ambulatory Visit: Payer: Self-pay

## 2021-07-30 ENCOUNTER — Inpatient Hospital Stay: Payer: Medicare Other

## 2021-07-30 ENCOUNTER — Other Ambulatory Visit (HOSPITAL_COMMUNITY): Payer: Self-pay

## 2021-07-30 ENCOUNTER — Inpatient Hospital Stay: Payer: Medicare Other | Attending: Hematology and Oncology

## 2021-07-30 DIAGNOSIS — Z95828 Presence of other vascular implants and grafts: Secondary | ICD-10-CM

## 2021-07-30 DIAGNOSIS — Z170 Estrogen receptor positive status [ER+]: Secondary | ICD-10-CM | POA: Insufficient documentation

## 2021-07-30 DIAGNOSIS — C50919 Malignant neoplasm of unspecified site of unspecified female breast: Secondary | ICD-10-CM

## 2021-07-30 DIAGNOSIS — C50412 Malignant neoplasm of upper-outer quadrant of left female breast: Secondary | ICD-10-CM

## 2021-07-30 DIAGNOSIS — C7951 Secondary malignant neoplasm of bone: Secondary | ICD-10-CM

## 2021-07-30 DIAGNOSIS — Z5112 Encounter for antineoplastic immunotherapy: Secondary | ICD-10-CM | POA: Diagnosis not present

## 2021-07-30 LAB — CBC WITH DIFFERENTIAL (CANCER CENTER ONLY)
Abs Immature Granulocytes: 0.01 10*3/uL (ref 0.00–0.07)
Basophils Absolute: 0.1 10*3/uL (ref 0.0–0.1)
Basophils Relative: 1 %
Eosinophils Absolute: 0.1 10*3/uL (ref 0.0–0.5)
Eosinophils Relative: 2 %
HCT: 31.7 % — ABNORMAL LOW (ref 36.0–46.0)
Hemoglobin: 10.7 g/dL — ABNORMAL LOW (ref 12.0–15.0)
Immature Granulocytes: 0 %
Lymphocytes Relative: 40 %
Lymphs Abs: 1.4 10*3/uL (ref 0.7–4.0)
MCH: 36.9 pg — ABNORMAL HIGH (ref 26.0–34.0)
MCHC: 33.8 g/dL (ref 30.0–36.0)
MCV: 109.3 fL — ABNORMAL HIGH (ref 80.0–100.0)
Monocytes Absolute: 0.4 10*3/uL (ref 0.1–1.0)
Monocytes Relative: 11 %
Neutro Abs: 1.6 10*3/uL — ABNORMAL LOW (ref 1.7–7.7)
Neutrophils Relative %: 46 %
Platelet Count: 105 10*3/uL — ABNORMAL LOW (ref 150–400)
RBC: 2.9 MIL/uL — ABNORMAL LOW (ref 3.87–5.11)
RDW: 15.4 % (ref 11.5–15.5)
WBC Count: 3.5 10*3/uL — ABNORMAL LOW (ref 4.0–10.5)
nRBC: 0 % (ref 0.0–0.2)

## 2021-07-30 LAB — CMP (CANCER CENTER ONLY)
ALT: 32 U/L (ref 0–44)
AST: 70 U/L — ABNORMAL HIGH (ref 15–41)
Albumin: 3.6 g/dL (ref 3.5–5.0)
Alkaline Phosphatase: 332 U/L — ABNORMAL HIGH (ref 38–126)
Anion gap: 9 (ref 5–15)
BUN: 14 mg/dL (ref 8–23)
CO2: 28 mmol/L (ref 22–32)
Calcium: 9.4 mg/dL (ref 8.9–10.3)
Chloride: 98 mmol/L (ref 98–111)
Creatinine: 0.63 mg/dL (ref 0.44–1.00)
GFR, Estimated: >60 mL/min (ref >60)
Glucose, Bld: 132 mg/dL — ABNORMAL HIGH (ref 70–99)
Potassium: 3.3 mmol/L — ABNORMAL LOW (ref 3.5–5.1)
Sodium: 135 mmol/L (ref 135–145)
Total Bilirubin: 1.1 mg/dL (ref 0.3–1.2)
Total Protein: 6.7 g/dL (ref 6.5–8.1)

## 2021-07-30 MED ORDER — HEPARIN SOD (PORK) LOCK FLUSH 100 UNIT/ML IV SOLN
500.0000 [IU] | Freq: Once | INTRAVENOUS | Status: AC | PRN
  Administered 2021-07-30: 500 [IU]

## 2021-07-30 MED ORDER — ACETAMINOPHEN 325 MG PO TABS
650.0000 mg | ORAL_TABLET | Freq: Once | ORAL | Status: AC
  Administered 2021-07-30: 650 mg via ORAL
  Filled 2021-07-30: qty 2

## 2021-07-30 MED ORDER — DIPHENHYDRAMINE HCL 25 MG PO CAPS
50.0000 mg | ORAL_CAPSULE | Freq: Once | ORAL | Status: AC
  Administered 2021-07-30: 50 mg via ORAL
  Filled 2021-07-30: qty 2

## 2021-07-30 MED ORDER — SODIUM CHLORIDE 0.9 % IV SOLN: Freq: Once | INTRAVENOUS | Status: AC

## 2021-07-30 MED ORDER — SODIUM CHLORIDE 0.9% FLUSH
10.0000 mL | Freq: Once | INTRAVENOUS | Status: AC
  Administered 2021-07-30: 10 mL

## 2021-07-30 MED ORDER — SODIUM CHLORIDE 0.9 % IV SOLN
2.4000 mg/kg | Freq: Once | INTRAVENOUS | Status: AC
  Administered 2021-07-30: 160 mg via INTRAVENOUS
  Filled 2021-07-30: qty 8

## 2021-07-30 MED ORDER — SODIUM CHLORIDE 0.9% FLUSH
10.0000 mL | INTRAVENOUS | Status: DC | PRN
  Administered 2021-07-30: 10 mL

## 2021-07-30 NOTE — Assessment & Plan Note (Signed)
Current Treatment:Kadcyla q [redacted] weeksalong with Verzenioand letrozole.(she completed bevacizumab x4) (Prior treatment abemaciclib with letrozole and Herceptin)  Biopsy of the liver lesion.12/11/2020: Metastatic carcinoma, ER 75% week to moderate, PR 0%, Ki-67 25%, HER-2 equivocal by IHC, FISH positive ratio 2.97, copy #5.2  Kadcyla toxicities: 1.Fatigue as a result of chemotherapy: Stable 2.chemo induced anemia: Monitoring closely, today's hemoglobin is 11.3 3.peripheral neuropathy: Because of worsening peripheral neuropathy, we will infuse Kadcyla every 4 weeks and also change Verzenio dosage to 50 mg twice daily for next month onwards. 4.Emergency room visit for leg discomfort: Hypokalemic sent home with potassium replacement therapy 5.Leukopenia: Related to Verzenio, stable 6.Thrombocytopenia: Platelets 101  Nonhealing wound on the right WKM:QKMMN care is assisting her Easy bruising on the hands:Possibly due to thrombocytopenia  Abemaciclib toxicities:denies diarrhea, Complains of more fatigue Because of neuropathy reduce the dosage of Verzenio to 50 mg p.o. twice daily.  This will start in October 2022.  CT CAP 04/02/21: Unchanged Hepatic and osseous disease, next scans will be done in December 2022 MRI brain 06/04/2021: Stable, no interval change in the ill-defined areas of enhancement in the right frontal and parietal lobes, unchanged right cerebral hemisphere and unchanged dural based nodular enhancement over right anterior temporal lobe, unchanged bone metastases  Return to clinic every 4 weeks for Kadcyla Next CT scan will be done in December

## 2021-07-30 NOTE — Patient Instructions (Signed)
[facility]   °Discharge Instructions: °Thank you for choosing [facility] to provide your oncology and hematology care.  ° °If you have a lab appointment with the Cancer Center, please go directly to the Cancer Center and check in at the registration area. °  °Wear comfortable clothing and clothing appropriate for easy access to any Portacath or PICC line.  ° °We strive to give you quality time with your provider. You may need to reschedule your appointment if you arrive late (15 or more minutes).  Arriving late affects you and other patients whose appointments are after yours.  Also, if you miss three or more appointments without notifying the office, you may be dismissed from the clinic at the provider’s discretion.    °  °For prescription refill requests, have your pharmacy contact our office and allow 72 hours for refills to be completed.   ° °Today you received the following chemotherapy and/or immunotherapy agents: ado-trastuzumab emtansine    °  °To help prevent nausea and vomiting after your treatment, we encourage you to take your nausea medication as directed. ° °BELOW ARE SYMPTOMS THAT SHOULD BE REPORTED IMMEDIATELY: °*FEVER GREATER THAN 100.4 F (38 °C) OR HIGHER °*CHILLS OR SWEATING °*NAUSEA AND VOMITING THAT IS NOT CONTROLLED WITH YOUR NAUSEA MEDICATION °*UNUSUAL SHORTNESS OF BREATH °*UNUSUAL BRUISING OR BLEEDING °*URINARY PROBLEMS (pain or burning when urinating, or frequent urination) °*BOWEL PROBLEMS (unusual diarrhea, constipation, pain near the anus) °TENDERNESS IN MOUTH AND THROAT WITH OR WITHOUT PRESENCE OF ULCERS (sore throat, sores in mouth, or a toothache) °UNUSUAL RASH, SWELLING OR PAIN  °UNUSUAL VAGINAL DISCHARGE OR ITCHING  ° °Items with * indicate a potential emergency and should be followed up as soon as possible or go to the Emergency Department if any problems should occur. ° °Please show the CHEMOTHERAPY ALERT CARD or IMMUNOTHERAPY ALERT  CARD at check-in to the Emergency Department and triage nurse. ° °Should you have questions after your visit or need to cancel or reschedule your appointment, please contact [facility]  Dept: [phone]  and follow the prompts.  Office hours are 8:00 a.m. to 4:30 p.m. Monday - Friday. Please note that voicemails left after 4:00 p.m. may not be returned until the following business day.  We are closed weekends and major holidays. You have access to a nurse at all times for urgent questions. Please call the main number to the clinic Dept: [phone] and follow the prompts. ° ° °For any non-urgent questions, you may also contact your provider using MyChart. We now offer e-Visits for anyone 18 and older to request care online for non-urgent symptoms. For details visit [email or web address]. °  °Also download the MyChart app! Go to the app store, search "MyChart", open the app, select , and log in with your MyChart username and password. ° °Due to Covid, a mask is required upon entering the hospital/clinic. If you do not have a mask, one will be given to you upon arrival. For doctor visits, patients may have 1 support person aged 18 or older with them. For treatment visits, patients cannot have anyone with them due to current Covid guidelines and our immunocompromised population.  ° °

## 2021-08-04 ENCOUNTER — Encounter (HOSPITAL_BASED_OUTPATIENT_CLINIC_OR_DEPARTMENT_OTHER): Payer: Medicare Other | Admitting: Physician Assistant

## 2021-08-04 ENCOUNTER — Other Ambulatory Visit: Payer: Self-pay

## 2021-08-04 DIAGNOSIS — I872 Venous insufficiency (chronic) (peripheral): Secondary | ICD-10-CM | POA: Diagnosis not present

## 2021-08-04 NOTE — Progress Notes (Signed)
[name] Riley, [name] Riley ([id number]) Visit Report for 08/04/2021 Arrival Information Details Patient Name: Date of Service: [name] Riley, [name] A. 08/04/2021 8:00 A M Medical Record Number: [id number] Patient Account Number: [id number] Date of Birth/Sex: Treating RN: 1951-06-01 (70 y.o. [name] Riley Primary Care Estephanie Hubbs: [name] Other Clinician: Referring Orien Mayhall: Treating Wilhemenia Camba/Extender: Vickii Penna in Treatment: 71 Visit Information History Since Last Visit Added or deleted any medications: No Patient Arrived: Ambulatory Any new allergies or adverse reactions: No Arrival Time: 07:56 Had a fall or experienced change in No Transfer Assistance: None activities of daily living that may affect Patient Identification Verified: Yes risk of falls: Secondary Verification Process Completed: Yes Signs or symptoms of abuse/neglect since last visito No Patient Requires Transmission-Based Precautions: No Hospitalized since last visit: No Patient Has Alerts: No Implantable device outside of the clinic excluding No cellular tissue based products placed in the center since last visit: Has Dressing in Place as Prescribed: Yes Has Compression in Place as Prescribed: Yes Pain Present Now: No Electronic Signature(s) Signed: 08/04/2021 5:50:03 PM By: [name] Entered By: [name] on 08/04/2021 07:56:32 -------------------------------------------------------------------------------- Clinic Level of Care Assessment Details Patient Name: Date of Service: [name] Riley, [name] 08/04/2021 8:00 A M Medical Record Number: [id number] Patient Account Number: [id number] Date of Birth/Sex: Treating RN: June 20, 1951 (70 y.o. [name] Riley Primary Care Omega Durante: [name] Other Clinician: Referring Hillel Card: Treating Lizmarie Witters/Extender: Vickii Penna in Treatment: 16 Clinic Level of Care Assessment Items TOOL 4 Quantity Score X- 1  0 Use when only an EandM is performed on FOLLOW-UP visit ASSESSMENTS - Nursing Assessment / Reassessment []  - 0 Reassessment of Co-morbidities (includes updates in patient status) []  - 0 Reassessment of Adherence to Treatment Plan ASSESSMENTS - Wound and Skin A ssessment / Reassessment X - Simple Wound Assessment / Reassessment - one wound 1 5 []  - 0 Complex Wound Assessment / Reassessment - multiple wounds []  - 0 Dermatologic / Skin Assessment (not related to wound area) ASSESSMENTS - Focused Assessment []  - 0 Circumferential Edema Measurements - multi extremities []  - 0 Nutritional Assessment / Counseling / Intervention []  - 0 Lower Extremity Assessment (monofilament, tuning fork, pulses) []  - 0 Peripheral Arterial Disease Assessment (using hand held doppler) ASSESSMENTS - Ostomy and/or Continence Assessment and Care []  - 0 Incontinence Assessment and Management []  - 0 Ostomy Care Assessment and Management (repouching, etc.) PROCESS - Coordination of Care []  - 0 Simple Patient / Family Education for ongoing care X- 1 20 Complex (extensive) Patient / Family Education for ongoing care []  - 0 Staff obtains Programmer, systems, Records, T Results / Process Orders est []  - 0 Staff telephones HHA, Nursing Homes / Clarify orders / etc []  - 0 Routine Transfer to another Facility (non-emergent condition) []  - 0 Routine Hospital Admission (non-emergent condition) []  - 0 New Admissions / Biomedical engineer / Ordering NPWT Apligraf, etc. , []  - 0 Emergency Hospital Admission (emergent condition) X- 1 10 Simple Discharge Coordination []  - 0 Complex (extensive) Discharge Coordination PROCESS - Special Needs []  - 0 Pediatric / Minor Patient Management []  - 0 Isolation Patient Management []  - 0 Hearing / Language / Visual special needs []  - 0 Assessment of Community assistance (transportation, D/C planning, etc.) []  - 0 Additional assistance / Altered mentation []  -  0 Support Surface(s) Assessment (bed, cushion, seat, etc.) INTERVENTIONS - Wound Cleansing / Measurement []  - 0 Simple Wound Cleansing - one wound []  - 0  Complex Wound Cleansing - multiple wounds X- 1 5 Wound Imaging (photographs - any number of wounds) []  - 0 Wound Tracing (instead of photographs) []  - 0 Simple Wound Measurement - one wound []  - 0 Complex Wound Measurement - multiple wounds INTERVENTIONS - Wound Dressings []  - 0 Small Wound Dressing one or multiple wounds []  - 0 Medium Wound Dressing one or multiple wounds []  - 0 Large Wound Dressing one or multiple wounds []  - 0 Application of Medications - topical []  - 0 Application of Medications - injection INTERVENTIONS - Miscellaneous []  - 0 External ear exam []  - 0 Specimen Collection (cultures, biopsies, blood, body fluids, etc.) []  - 0 Specimen(s) / Culture(s) sent or taken to Lab for analysis []  - 0 Patient Transfer (multiple staff / Civil Service fast streamer / Similar devices) []  - 0 Simple Staple / Suture removal (25 or less) []  - 0 Complex Staple / Suture removal (26 or more) []  - 0 Hypo / Hyperglycemic Management (close monitor of Blood Glucose) []  - 0 Ankle / Brachial Index (ABI) - do not check if billed separately X- 1 5 Vital Signs Has the patient been seen at the hospital within the last three years: Yes Total Score: 45 Level Of Care: New/Established - Level 2 Electronic Signature(s) Signed: 08/04/2021 5:50:03 PM By: [name] Entered By: [name] on 08/04/2021 08:17:28 -------------------------------------------------------------------------------- Encounter Discharge Information Details Patient Name: Date of Service: [name] Riley, [name] 08/04/2021 8:00 A M Medical Record Number: [id number] Patient Account Number: [id number] Date of Birth/Sex: Treating RN: [birth year] (70 y.o. [name] Riley Primary Care Ashland Wiseman: [name] Other Clinician: Referring Chamika Cunanan: Treating Consuela Widener/Extender:  Vickii Penna in Treatment: 16 Encounter Discharge Information Items Discharge Condition: Stable Ambulatory Status: Ambulatory Discharge Destination: Home Transportation: Private Auto Schedule Follow-up Appointment: No Clinical Summary of Care: Provided on 08/04/2021 Form Type Recipient Paper Patient Patient Electronic Signature(s) Signed: 08/04/2021 5:50:03 PM By: [name] Entered By: [name] on 08/04/2021 08:21:11 -------------------------------------------------------------------------------- Lower Extremity Assessment Details Patient Name: Date of Service: [name] Riley, [name] 08/04/2021 8:00 A M Medical Record Number: [id number] Patient Account Number: [id number] Date of Birth/Sex: Treating RN: Nov 03, 1950 (70 y.o. [name] Riley Primary Care Ian Castagna: [name] Other Clinician: Referring Johara Lodwick: Treating Terell Kincy/Extender: Vickii Penna in Treatment: 16 Edema Assessment Assessed: Shirlyn Goltz: No] Patrice Paradise: Yes] Edema: [Left: N] [Right: o] Calf Left: Right: Point of Measurement: 29 cm From Medial Instep 30.4 cm Ankle Left: Right: Point of Measurement: 10 cm From Medial Instep 20 cm Vascular Assessment Pulses: Dorsalis Pedis Palpable: [Right:Yes] Electronic Signature(s) Signed: 08/04/2021 5:50:03 PM By: [name] Entered By: [name] on 08/04/2021 08:04:02 -------------------------------------------------------------------------------- Multi-Disciplinary Care Plan Details Patient Name: Date of Service: [name] Riley, [name] 08/04/2021 8:00 A M Medical Record Number: [id number] Patient Account Number: [id number] Date of Birth/Sex: Treating RN: [birth year] (70 y.o. [name] Riley Primary Care Diamante Rubin: [name] Other Clinician: Referring Sotiria Keast: Treating Lamara Brecht/Extender: Vickii Penna in Treatment: [place] reviewed with physician Active  Inactive Electronic Signature(s) Signed: 08/04/2021 5:50:03 PM By: [name] Entered By: [name] on 08/04/2021 08:16:17 -------------------------------------------------------------------------------- Pain Assessment Details Patient Name: Date of Service: [name] Riley, [name] 08/04/2021 8:00 A M Medical Record Number: [id number] Patient Account Number: [id number] Date of Birth/Sex: Treating RN: [birth year] (70 y.o. [name] Riley Primary Care Mikai Meints: [name] Other Clinician: Referring Ezinne Yogi: Treating Laurali Goddard/Extender: Vickii Penna in Treatment: [phone]  Active Problems Location of Pain Severity and Description of Pain Patient Has Paino No Site Locations Pain Management and Medication Current Pain Management: Electronic Signature(s) Signed: 08/04/2021 5:50:03 PM By: [name] Entered By: [name] on 08/04/2021 08:00:05 -------------------------------------------------------------------------------- Patient/Caregiver Education Details Patient Name: Date of Service: [name] Merlin A. 10/19/2022andnbsp8:00 [facility] Record Number: [id number] Patient Account Number: [id number] Date of Birth/Gender: Treating RN: 1951-06-28 (70 y.o. [name] Riley Primary Care Physician: [name] Other Clinician: Referring Physician: Treating Physician/Extender: Vickii Penna in Treatment: 16 Education Assessment Education Provided To: Patient Education Topics Provided Venous: Methods: Demonstration, Explain/Verbal, Printed Responses: State content correctly Electronic Signature(s) Signed: 08/04/2021 5:50:03 PM By: [name] Entered By: [name] on 08/04/2021 08:16:35 -------------------------------------------------------------------------------- Wound Assessment Details Patient Name: Date of Service: [name] Riley, [name] 08/04/2021 8:00 A M Medical Record Number: [id number] Patient  Account Number: [id number] Date of Birth/Sex: Treating RN: Oct 12, 1951 (70 y.o. [name] Riley Primary Care Caylynn Minchew: [name] Other Clinician: Referring Margery Szostak: Treating Aaisha Sliter/Extender: Vickii Penna in Treatment: 16 Wound Status Wound Number: 4 Primary Venous Leg Ulcer Etiology: Wound Location: Right, Lateral Lower Leg Wound Healed - Epithelialized Wounding Event: Blister Status: Date Acquired: 07/11/2021 Comorbid Coronary Artery Disease, Hypertension, Myocardial Infarction, Weeks Of Treatment: 3 History: Neuropathy, Received Chemotherapy, Received Radiation Clustered Wound: No Photos Wound Measurements Length: (cm) Width: (cm) Depth: (cm) Area: (cm) Volume: (cm) 0 % Reduction in Area: 100% 0 % Reduction in Volume: 100% 0 0 0 Wound Description Classification: Full Thickness Without Exposed Support Structur es Electronic Signature(s) Signed: 08/04/2021 5:50:03 PM By: [name] Signed: 08/04/2021 5:56:18 PM By: Levan Hurst RN, BSN Entered By: Levan Hurst on 08/04/2021 08:09:20 -------------------------------------------------------------------------------- Vitals Details Patient Name: Date of Service: [name] Riley, [name] 08/04/2021 8:00 A M Medical Record Number: [id number] Patient Account Number: [id number] Date of Birth/Sex: Treating RN: 07-Dec-1950 (70 y.o. [name] Riley Primary Care Daley Gosse: [name] Other Clinician: Referring Maryjo Ragon: Treating Kiyonna Tortorelli/Extender: Vickii Penna in Treatment: 16 Vital Signs Time Taken: 07:59 Temperature (F): 98.5 Height (in): 68 Pulse (bpm): 93 Weight (lbs): 157 Respiratory Rate (breaths/min): 16 Body Mass Index (BMI): 23.9 Blood Pressure (mmHg): 127/68 Reference Range: 80 - 120 mg / dl Electronic Signature(s) Signed: 08/04/2021 5:50:03 PM By: [name] Entered By: [name] on 08/04/2021 07:59:58

## 2021-08-04 NOTE — Progress Notes (Addendum)
[name], [name] ([id number]) Visit Report for 08/04/2021 Chief Complaint Document Details Patient Name: Date of Service: [name] [name], [name] [name]. 08/04/2021 8:00 [name] [name] Medical Record Number: [id number] Patient Account Number: [id number] Date of Birth/Sex: Treating RN: May 24, 1951 (70 y.o. [name] Primary Care Provider: [name] Other Clinician: Referring Provider: Treating Provider/Extender: Vickii Penna in Treatment: 16 Information Obtained from: Patient Chief Complaint Right LE Ulcer and right forearm ulcer Electronic Signature(s) Signed: 08/04/2021 8:11:19 AM By: Worthy Keeler PA-C Entered By: Worthy Keeler on 08/04/2021 08:11:18 -------------------------------------------------------------------------------- HPI Details Patient Name: Date of Service: [name] [name]. 08/04/2021 8:00 [name] [name] Medical Record Number: [id number] Patient Account Number: [id number] Date of Birth/Sex: Treating RN: 08/23/1951 (70 y.o. [name] Primary Care Provider: [name] Other Clinician: Referring Provider: Treating Provider/Extender: Vickii Penna in Treatment: 16 History of Present Illness HPI Description: 04/14/2021 upon evaluation today patient presents for initial inspection here in the clinic concerning issues that she has been having with [name] wound on her right anterior lower leg after she had an abrasion trauma 4 weeks ago. She tells me that unfortunately following this trauma she has been experiencing quite [name] bit of discomfort at times with some burning and stinging. She is also had [name] scab up once or twice but states that right now she has been unable to really get it to scab back over. This has been [name] frustration for her and she is not really able to get it to heal as quickly and effectively as she would like to see. The patient also tells me that she has been tolerating the dressing changes without complication. She has  just been using over-the-counter antibiotic ointment and try to keep it covered with [name] dry dressing at times depending on what she was told to do. Nonetheless right now she is also been on Keflex that did not really seem to help she was switched over to Augmentin she does started that on Friday. Currently I do not see anything that seems to be obviously infected but nonetheless I think it is fine for her to take the Augmentin just to make sure that we do not get anything that initiates in the interim. The patient is currently undergoing chemotherapy she has had previous radiation. Nonetheless in regard to the chemotherapy this could be affecting her ability to heal to some degree. 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 She has hypertension, neuropathy associated with the chemotherapy, and [name] history of heart disease. She is currently again undergoing active chemotherapy for recurrent breast cancer. 04/21/2021 upon evaluation today patient appears to be doing well with regard to her leg ulcer. This is actually looking significantly improved. There does not appear to be any signs of infection visually and I am happy in that regard. Fortunately there is no signs of systemic infection either which is also great news. No fevers, chills, nausea, vomiting, or diarrhea. 05/05/2021 upon evaluation today patient appears to be doing well with regard to her leg ulcer. She has been tolerating the dressing changes without complication. Fortunately there does not appear to be any signs of infection which is great news. No fevers, chills, nausea, vomiting, or diarrhea. 05/12/2021 upon evaluation today patient actually appears to be making good improvement in general. I am extremely pleased with where things stand today. There is no signs  of active infection which is also great news. 05/19/2021 upon evaluation  today patient appears to be doing well with regard to her wound. This is actually showing signs of excellent granulation epithelization at this point. I do not see any evidence of infection which is great and overall I am extremely pleased with where we stand today. In general I think that the patient is making leasing strides towards getting this healed. The issue that she had unfortunately her mother did pass this past week. She was at the wedding yesterday when she actually felt faint coming out of the church there is [name] lot of emotional situation involved obviously. Nonetheless she somewhat started to pass out her brothers caught her but she did hit her leg laterally underneath the wrap on the side of the steps. This caused [name] small skin tear and some contusion fortunately this is not too bad but nonetheless I do think that this needs to be monitored currently. 05/26/2021 upon evaluation today patient appears to be doing excellent in regard to her wound on the leg. This is can require some sharp debridement but in general I feel like she is doing very well. The new skin tear that was noted last week is actually healing quite nicely reattached and has done great I think this will probably be pretty much resolved by next week and then should start just even an hour after that. As far as the color of the skin is concerned the bruising will fade is what I mean by that. 06/05/2021 upon evaluation today patient appears to be doing well with regard to her wound. She has been tolerating the dressing changes without complication. Fortunately there does not appear to be any signs of infection the good news is she is actually healing quite nicely and very close to resolution on the anterior wound and the new wound that was on the right last time where she had struck this during almost collapsing at her mom's funeral actually seems to be doing better although there is still [name] small opening here as well. 06/16/2021  upon evaluation today patient appears to be doing well with regard to her leg ulcer. She has been tolerating the dressing changes without complication. Fortunately there does not appear to be any signs of active infection at this time. No fevers, chills, nausea, vomiting, or diarrhea. 06/23/2021 upon evaluation today patient appears to be doing decently well in regard to her wounds. In fact everything appears to be completely healed based on what I see currently. Fortunately there does not appear to be any signs of active infection at this time. No fevers, chills, nausea, vomiting, or diarrhea. Readmission: 07/14/2021 upon evaluation today patient actually appears to be doing decently well in regard to her original wound which again is completely healed at this point. Unfortunately she does have [name] blister on the medial aspect of her leg which occurred after she was advised to hold off on using her compression stockings due to the fact she was having [name] lot of bruising. Fortunately though she has this blister there does not appear to be any signs of infection which is good news nonetheless I do believe she is going to require [name] to go back to utilizing the compression wrap to get this under control. She also has [name] skin tear on her forearm which is new as well. 07/21/2021 upon evaluation today patient's wound is actually showing signs of excellent improvement which is great news. I do not see any evidence  of active infection at this time which is great news as well. Overall I think that she is really doing excellent. 08/04/2021 upon evaluation today patient appears to be doing well currently in regard to her wound in fact this is completely healed which is great news. Fortunately there does not appear to be any signs of active infection at this time which is also excellent. No fevers, chills, nausea, vomiting, or diarrhea. Electronic Signature(s) Signed: 08/04/2021 8:22:21 AM By: Worthy Keeler  PA-C Entered By: Worthy Keeler on 08/04/2021 08:22:20 -------------------------------------------------------------------------------- Physical Exam Details Patient Name: Date of Service: [place], [name] [name]. 08/04/2021 8:00 [name] [name] Medical Record Number: [id number] Patient Account Number: [id number] Date of Birth/Sex: Treating RN: [birth year] (70 y.o. [name] Primary Care Provider: [name] Other Clinician: Referring Provider: Treating Provider/Extender: Vickii Penna in Treatment: 22 Constitutional Well-nourished and well-hydrated in no acute distress. Respiratory normal breathing without difficulty. Psychiatric this patient is able to make decisions and demonstrates good insight into disease process. Alert and Oriented x 3. pleasant and cooperative. Notes Patient's wound bed showed signs of good granulation epithelization at this point. Fortunately there does not appear to be any evidence of infection which is great news as well. Electronic Signature(s) Signed: 08/04/2021 8:28:05 AM By: Worthy Keeler PA-C Entered By: Worthy Keeler on 08/04/2021 08:28:05 -------------------------------------------------------------------------------- Physician Orders Details Patient Name: Date of Service: [place], [name] [name]. 08/04/2021 8:00 [name] [name] Medical Record Number: [id number] Patient Account Number: [id number] Date of Birth/Sex: Treating RN: 11-May-1951 (70 y.o. [name] Primary Care Provider: [name] Other Clinician: Referring Provider: Treating Provider/Extender: Vickii Penna in Treatment: [phone] Verbal / Phone Orders: No Diagnosis Coding ICD-10 Coding Code Description I87.2 Venous insufficiency (chronic) (peripheral) L97.812 Non-pressure chronic ulcer of other part of right lower leg with fat layer exposed S51.811A Laceration without foreign body of right forearm, initial encounter G90.09 Other idiopathic  peripheral autonomic neuropathy C79.81 Secondary malignant neoplasm of breast I10 Essential (primary) hypertension I25.10 Atherosclerotic heart disease of native coronary artery without angina pectoris Z92.21 Personal history of antineoplastic chemotherapy Discharge From [facility] Discharge from [place] - No further follow up needed, call if any changes. Edema Control - Lymphedema / SCD / Other Patient to wear own compression stockings every day. - T bilateral legs: Apply in morning and remove at bedtime o Moisturize legs daily. - In evening after removing compression stockings Electronic Signature(s) Signed: 08/04/2021 4:03:51 PM By: Worthy Keeler PA-C Signed: 08/04/2021 5:50:03 PM By: [name] Entered By: [name] on 08/04/2021 08:15:53 -------------------------------------------------------------------------------- Problem List Details Patient Name: Date of Service: [place], [name] [name]. 08/04/2021 8:00 [name] [name] Medical Record Number: [id number] Patient Account Number: [id number] Date of Birth/Sex: Treating RN: 1951-04-16 (70 y.o. [name] Primary Care Provider: [name] Other Clinician: Referring Provider: Treating Provider/Extender: Vickii Penna in Treatment: [phone] Active Problems ICD-10 Encounter Code Description Active Date MDM Diagnosis I87.2 Venous insufficiency (chronic) (peripheral) 04/14/2021 No Yes L97.812 Non-pressure chronic ulcer of other part of right lower leg with fat layer 04/14/2021 No Yes exposed S51.811A Laceration without foreign body of right forearm, initial encounter 07/14/2021 No Yes G90.09 Other idiopathic peripheral autonomic neuropathy 04/14/2021 No Yes C79.81 Secondary malignant neoplasm of breast 04/14/2021 No Yes I10 Essential (primary) hypertension 04/14/2021 No Yes I25.10 Atherosclerotic heart disease of native coronary artery without angina pectoris 04/14/2021 No Yes Z92.21 Personal history  of antineoplastic  chemotherapy 04/14/2021 No Yes Inactive Problems Resolved Problems ICD-10 Code Description Active Date Resolved Date S80.811A Abrasion, right lower leg, initial encounter 04/14/2021 04/14/2021 Electronic Signature(s) Signed: 08/04/2021 8:11:13 AM By: Worthy Keeler PA-C Entered By: Worthy Keeler on 08/04/2021 08:11:12 -------------------------------------------------------------------------------- Progress Note Details Patient Name: Date of Service: [place], [name] [name]. 08/04/2021 8:00 [name] [name] Medical Record Number: [id number] Patient Account Number: [id number] Date of Birth/Sex: Treating RN: 1951-08-21 (70 y.o. [name] Primary Care Provider: [name] Other Clinician: Referring Provider: Treating Provider/Extender: Vickii Penna in Treatment: 16 Subjective Chief Complaint Information obtained from Patient Right LE Ulcer and right forearm ulcer History of Present Illness (HPI) 04/14/2021 upon evaluation today patient presents for initial inspection here in the clinic concerning issues that she has been having with [name] wound on her right anterior lower leg after she had an abrasion trauma 4 weeks ago. She tells me that unfortunately following this trauma she has been experiencing quite [name] bit of discomfort at times with some burning and stinging. She is also had [name] scab up once or twice but states that right now she has been unable to really get it to scab back over. This has been [name] frustration for her and she is not really able to get it to heal as quickly and effectively as she would like to see. The patient also tells me that she has been tolerating the dressing changes without complication. She has just been using over-the-counter antibiotic ointment and try to keep it covered with [name] dry dressing at times depending on what she was told to do. Nonetheless right now she is also been on Keflex that did not really seem to help she was  switched over to Augmentin she does started that on Friday. Currently I do not see anything that seems to be obviously infected but nonetheless I think it is fine for her to take the Augmentin just to make sure that we do not get anything that initiates in the interim. The patient is currently undergoing chemotherapy she has had previous radiation. Nonetheless in regard to the chemotherapy this could be affecting her ability to heal to some degree. 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 She has hypertension, neuropathy associated with the chemotherapy, and [name] history of heart disease. She is currently again undergoing active chemotherapy for recurrent breast cancer. 04/21/2021 upon evaluation today patient appears to be doing well with regard to her leg ulcer. This is actually looking significantly improved. There does not appear to be any signs of infection visually and I am happy in that regard. Fortunately there is no signs of systemic infection either which is also great news. No fevers, chills, nausea, vomiting, or diarrhea. 05/05/2021 upon evaluation today patient appears to be doing well with regard to her leg ulcer. She has been tolerating the dressing changes without complication. Fortunately there does not appear to be any signs of infection which is great news. No fevers, chills, nausea, vomiting, or diarrhea. 05/12/2021 upon evaluation today patient actually appears to be making good improvement in general. I am extremely pleased with where things stand today. There is no signs of active infection which is also great news. 05/19/2021 upon evaluation today patient appears to be doing well with regard to her wound. This is actually showing signs of excellent granulation epithelization at this point. I do not see any  evidence of infection which is great and overall I am extremely pleased  with where we stand today. In general I think that the patient is making leasing strides towards getting this healed. The issue that she had unfortunately her mother did pass this past week. She was at the wedding yesterday when she actually felt faint coming out of the church there is [name] lot of emotional situation involved obviously. Nonetheless she somewhat started to pass out her brothers caught her but she did hit her leg laterally underneath the wrap on the side of the steps. This caused [name] small skin tear and some contusion fortunately this is not too bad but nonetheless I do think that this needs to be monitored currently. 05/26/2021 upon evaluation today patient appears to be doing excellent in regard to her wound on the leg. This is can require some sharp debridement but in general I feel like she is doing very well. The new skin tear that was noted last week is actually healing quite nicely reattached and has done great I think this will probably be pretty much resolved by next week and then should start just even an hour after that. As far as the color of the skin is concerned the bruising will fade is what I mean by that. 06/05/2021 upon evaluation today patient appears to be doing well with regard to her wound. She has been tolerating the dressing changes without complication. Fortunately there does not appear to be any signs of infection the good news is she is actually healing quite nicely and very close to resolution on the anterior wound and the new wound that was on the right last time where she had struck this during almost collapsing at her mom's funeral actually seems to be doing better although there is still [name] small opening here as well. 06/16/2021 upon evaluation today patient appears to be doing well with regard to her leg ulcer. She has been tolerating the dressing changes without complication. Fortunately there does not appear to be any signs of active infection at this time. No  fevers, chills, nausea, vomiting, or diarrhea. 06/23/2021 upon evaluation today patient appears to be doing decently well in regard to her wounds. In fact everything appears to be completely healed based on what I see currently. Fortunately there does not appear to be any signs of active infection at this time. No fevers, chills, nausea, vomiting, or diarrhea. Readmission: 07/14/2021 upon evaluation today patient actually appears to be doing decently well in regard to her original wound which again is completely healed at this point. Unfortunately she does have [name] blister on the medial aspect of her leg which occurred after she was advised to hold off on using her compression stockings due to the fact she was having [name] lot of bruising. Fortunately though she has this blister there does not appear to be any signs of infection which is good news nonetheless I do believe she is going to require [name] to go back to utilizing the compression wrap to get this under control. She also has [name] skin tear on her forearm which is new as well. 07/21/2021 upon evaluation today patient's wound is actually showing signs of excellent improvement which is great news. I do not see any evidence of active infection at this time which is great news as well. Overall I think that she is really doing excellent. 08/04/2021 upon evaluation today patient appears to be doing well currently in regard to her wound in fact this  is completely healed which is great news. Fortunately there does not appear to be any signs of active infection at this time which is also excellent. No fevers, chills, nausea, vomiting, or diarrhea. Objective Constitutional Well-nourished and well-hydrated in no acute distress. Vitals Time Taken: 7:59 AM, Height: 68 in, Weight: 157 lbs, BMI: 23.9, Temperature: 98.5 F, Pulse: 93 bpm, Respiratory Rate: 16 breaths/min, Blood Pressure: 127/68 mmHg. Respiratory normal breathing without difficulty. Psychiatric this  patient is able to make decisions and demonstrates good insight into disease process. Alert and Oriented x 3. pleasant and cooperative. General Notes: Patient's wound bed showed signs of good granulation epithelization at this point. Fortunately there does not appear to be any evidence of infection which is great news as well. Integumentary (Hair, Skin) Wound #4 status is Healed - Epithelialized. Original cause of wound was Blister. The date acquired was: 07/11/2021. The wound has been in treatment 3 weeks. The wound is located on the Right,Lateral Lower Leg. The wound measures 0cm length x 0cm width x 0cm depth; 0cm^2 area and 0cm^3 volume. Assessment Active Problems ICD-10 Venous insufficiency (chronic) (peripheral) Non-pressure chronic ulcer of other part of right lower leg with fat layer exposed Laceration without foreign body of right forearm, initial encounter Other idiopathic peripheral autonomic neuropathy Secondary malignant neoplasm of breast Essential (primary) hypertension Atherosclerotic heart disease of native coronary artery without angina pectoris Personal history of antineoplastic chemotherapy Plan Discharge From [facility]: Discharge from [facility] - No further follow up needed, call if any changes. Edema Control - Lymphedema / SCD / Other: Patient to wear own compression stockings every day. - T bilateral legs: Apply in morning and remove at bedtime o Moisturize legs daily. - In evening after removing compression stockings 1. I am going to discontinue wound care services as the patient is completely healed she is in agreement the plan. 2. I am also going to recommend that she should be wearing compression socks and hopefully this will keep things under control if she has any issues she should let me know soon as possible. We will see the patient back for follow-up visit as needed. Electronic Signature(s) Signed: 08/04/2021 8:40:17 AM By: Worthy Keeler  PA-C Entered By: Worthy Keeler on 08/04/2021 08:40:17 -------------------------------------------------------------------------------- SuperBill Details Patient Name: Date of Service: [place], [name] [name]. 08/04/2021 Medical Record Number: [id number] Patient Account Number: [id number] Date of Birth/Sex: Treating RN: May 25, 1951 (70 y.o. [name] Primary Care Provider: [name] Other Clinician: Referring Provider: Treating Provider/Extender: Vickii Penna in Treatment: 16 Diagnosis Coding ICD-10 Codes Code Description I87.2 Venous insufficiency (chronic) (peripheral) L97.812 Non-pressure chronic ulcer of other part of right lower leg with fat layer exposed S51.811A Laceration without foreign body of right forearm, initial encounter G90.09 Other idiopathic peripheral autonomic neuropathy C79.81 Secondary malignant neoplasm of breast I10 Essential (primary) hypertension I25.10 Atherosclerotic heart disease of native coronary artery without angina pectoris Z92.21 Personal history of antineoplastic chemotherapy Facility Procedures CPT4 Code: [id number] Description: [id number] - WOUND CARE VISIT-LEV 2 EST PT Modifier: Quantity: 1 Physician Procedures : CPT4 Code Description Modifier [id number] - WC PHYS LEVEL 3 - EST PT ICD-10 Diagnosis Description I87.2 Venous insufficiency (chronic) (peripheral) L97.812 Non-pressure chronic ulcer of other part of right lower leg with fat layer exposed S51.811A  Laceration without foreign body of right forearm, initial encounter G90.09 Other idiopathic peripheral autonomic neuropathy Quantity: 1 Electronic Signature(s) Signed: 08/04/2021 8:40:36 AM By: Worthy Keeler PA-C Entered By: Melburn Hake,  Britteny Fiebelkorn on 08/04/2021 08:40:36

## 2021-08-12 ENCOUNTER — Encounter: Payer: Self-pay | Admitting: Family Medicine

## 2021-08-12 ENCOUNTER — Ambulatory Visit (INDEPENDENT_AMBULATORY_CARE_PROVIDER_SITE_OTHER): Payer: Medicare Other | Admitting: Family Medicine

## 2021-08-12 ENCOUNTER — Other Ambulatory Visit: Payer: Self-pay

## 2021-08-12 VITALS — BP 112/60 | HR 79 | Temp 97.5°F | Ht 68.0 in | Wt 131.4 lb

## 2021-08-12 DIAGNOSIS — Z23 Encounter for immunization: Secondary | ICD-10-CM

## 2021-08-12 DIAGNOSIS — C50919 Malignant neoplasm of unspecified site of unspecified female breast: Secondary | ICD-10-CM | POA: Diagnosis not present

## 2021-08-12 DIAGNOSIS — E782 Mixed hyperlipidemia: Secondary | ICD-10-CM | POA: Diagnosis not present

## 2021-08-12 DIAGNOSIS — E039 Hypothyroidism, unspecified: Secondary | ICD-10-CM

## 2021-08-12 DIAGNOSIS — I10 Essential (primary) hypertension: Secondary | ICD-10-CM

## 2021-08-12 DIAGNOSIS — H6123 Impacted cerumen, bilateral: Secondary | ICD-10-CM

## 2021-08-12 MED ORDER — VALSARTAN 80 MG PO TABS: ORAL_TABLET | ORAL | 3 refills | Status: DC

## 2021-08-12 MED ORDER — METOPROLOL SUCCINATE ER 100 MG PO TB24: ORAL_TABLET | ORAL | 3 refills | Status: DC

## 2021-08-12 MED ORDER — ROSUVASTATIN CALCIUM 5 MG PO TABS: 5.0000 mg | ORAL_TABLET | Freq: Every day | ORAL | 3 refills | Status: DC

## 2021-08-12 NOTE — Progress Notes (Signed)
Subjective:  Patient ID: [name] Riley, female    DOB: 1951-02-09  Age: 70 y.o. MRN: [id number]  CC: Medical Management of Chronic Issues   HPI [name] Riley presents for  follow-up of hypertension. Patient has no history of headache chest pain or shortness of breath or recent cough. Patient also denies symptoms of TIA such as focal numbness or weakness. Patient denies side effects from medication. States taking it regularly.   follow-up on  thyroid. The patient has a history of hypothyroidism for many years. It has been stable recently. Pt. denies any change in  voice, loss of hair, heat or cold intolerance. Energy level has been adequate to good. Patient denies constipation and diarrhea. No myxedema. Medication is as noted below. Verified that pt is taking it daily on an empty stomach. Well tolerated.   in for follow-up of elevated cholesterol. Doing well without complaints on current medication. Denies side effects of statin including myalgia and arthralgia and nausea. Currently no chest pain, shortness of breath or other cardiovascular related symptoms noted.  Recent wound on right shin that didn't heal properly. Went to wound center for 8 weeks. Now healed. Taking verzenio to suppress breast cancer.   History Ahyana has a past medical history of Allergy, Breast cancer ([place]) (08/25/2011), Cancer [facility]), Coronary artery disease (2001), Heart attack ([place]) (09/2000), History of chemotherapy (comp. 08/22/2012), Hyperlipidemia, Hypertension, Hypothyroidism, PONV (postoperative nausea and vomiting), and Status post radiation therapy (07/09/12 - 08/22/2012).   She has a past surgical history that includes Tonsillectomy (1968); Ovarian cyst surgery (Right, 1970); skin tags (05/09/1997); Portacath placement (11/14/2011); Breast surgery (1998); Breast surgery (11/14/11); Abdominal hysterectomy (1998); Appendectomy (1970); Port-a-cath removal (08/30/2012); Mastectomy (Bilateral); Portacath placement (N/A,  04/25/2016); Mass excision (Left, 06/22/2017); Incision and drainage of wound (Left, 07/01/2017); and Breast cyst excision (Left, 07/29/2020).   Her family history includes Cancer in her cousin, paternal aunt, paternal grandfather, and paternal uncle; Cancer (age of onset: 64) in her father; Diabetes in her maternal grandmother; Hypertension in her maternal grandmother and mother.She reports that she has never smoked. She has never used smokeless tobacco. She reports that she does not drink alcohol and does not use drugs.  Current Outpatient Medications on File Prior to Visit  Medication Sig Dispense Refill   abemaciclib (VERZENIO) 50 MG tablet Take 1 tablet (50 mg total) by mouth 2 (two) times daily. 56 tablet 3   acetaminophen (TYLENOL) 500 MG tablet Take 1,000 mg by mouth every 6 (six) hours as needed for moderate pain.     Ascorbic Acid (VITAMIN C PO) Take by mouth.     cholecalciferol (VITAMIN D) 1000 units tablet Take 1 tablet (1,000 Units total) by mouth daily.     Docusate Sodium (DSS) 100 MG CAPS Take by mouth.     furosemide (LASIX) 20 MG tablet TAKE ONE (1) TABLET BY MOUTH EVERY DAY 30 tablet 1   letrozole (FEMARA) 2.5 MG tablet TAKE ONE (1) TABLET EACH DAY 90 tablet 3   levETIRAcetam (KEPPRA) 500 MG tablet TAKE ONE TABLET BY MOUTH TWICE DAILY 60 tablet 3   levothyroxine (SYNTHROID) 100 MCG tablet TAKE ONE TABLET EACH MORNING BEFORE BREAKFAST 90 tablet 2   lidocaine-prilocaine (EMLA) cream APPLY TOPICALLY AS NEEDED FOR PORT ACCESS 30 g 3   Multiple Vitamins-Minerals (MULTIVITAMIN WITH MINERALS) tablet Take 1 tablet by mouth daily.     ondansetron (ZOFRAN-ODT) 4 MG disintegrating tablet TAKE 1 TABLET EVERY 6 HOURS AS NEEDED FOR NAUSEA AND VOMITING 30 tablet  3   predniSONE (DELTASONE) 50 MG tablet Patient to take 13 hours, 7 hours, and 1 hour prior to CT scan 3 tablet 0   triamcinolone (KENALOG) 0.025 % ointment Apply 1 application topically 2 (two) times daily. 80 g 1   potassium chloride  SA (KLOR-CON) 20 MEQ tablet Take 1 tablet (20 mEq total) by mouth 2 (two) times daily for 7 days. 14 tablet 0   Current Facility-Administered Medications on File Prior to Visit  Medication Dose Route Frequency Provider Last Rate Last Admin   sodium chloride flush (NS) 0.9 % injection 10 mL  10 mL Intracatheter PRN Nicholas Lose, MD   10 mL at 05/03/21 1653    ROS Review of Systems  Constitutional: Negative.   HENT:  Positive for ear pain (feel full. Concerned about wax).   Eyes:  Negative for visual disturbance.  Respiratory:  Negative for shortness of breath.   Cardiovascular:  Negative for chest pain.  Gastrointestinal:  Positive for constipation (occasional. Has always been one who doesn't go every day. Concerned she should). Negative for abdominal pain.  Musculoskeletal:  Negative for arthralgias.   Objective:  BP 112/60   Pulse 79   Temp (!) 97.5 F (36.4 C)   Ht 5' 8"  (1.727 m)   Wt 131 lb 6.4 oz (59.6 kg)   SpO2 96%   BMI 19.98 kg/m   BP Readings from Last 3 Encounters:  08/12/21 112/60  07/30/21 131/71  07/02/21 (!) 146/79    Wt Readings from Last 3 Encounters:  08/12/21 131 lb 6.4 oz (59.6 kg)  07/30/21 134 lb 4.8 oz (60.9 kg)  07/02/21 138 lb 1.6 oz (62.6 kg)     Physical Exam Constitutional:      General: She is not in acute distress.    Appearance: She is well-developed.  HENT:     Head: Normocephalic and atraumatic.     Right Ear: There is impacted cerumen.     Left Ear: There is impacted cerumen.  Eyes:     Conjunctiva/sclera: Conjunctivae normal.     Pupils: Pupils are equal, round, and reactive to light.  Neck:     Thyroid: No thyromegaly.  Cardiovascular:     Rate and Rhythm: Normal rate and regular rhythm.     Heart sounds: Normal heart sounds. No murmur heard. Pulmonary:     Effort: Pulmonary effort is normal. No respiratory distress.     Breath sounds: Normal breath sounds. No wheezing or rales.  Abdominal:     General: Bowel sounds are  normal. There is no distension.     Palpations: Abdomen is soft.     Tenderness: There is no abdominal tenderness.  Musculoskeletal:        General: Normal range of motion.     Cervical back: Normal range of motion and neck supple.  Lymphadenopathy:     Cervical: No cervical adenopathy.  Skin:    General: Skin is warm and dry.  Neurological:     Mental Status: She is alert and oriented to person, place, and time.  Psychiatric:        Behavior: Behavior normal.        Thought Content: Thought content normal.        Judgment: Judgment normal.      Assessment & Plan:   Lailany was seen today for medical management of chronic issues.  Diagnoses and all orders for this visit:  Mixed hyperlipidemia -     Lipid panel  Metastatic  breast cancer (HCC) -     metoprolol succinate (TOPROL-XL) 100 MG 24 hr tablet; Take with or immediately following a meal.  Essential hypertension -     metoprolol succinate (TOPROL-XL) 100 MG 24 hr tablet; Take with or immediately following a meal. -     valsartan (DIOVAN) 80 MG tablet; TAKE ONE (1) TABLET EACH DAY -     CBC with Differential/Platelet -     CMP14+EGFR  Hypothyroidism, unspecified type -     TSH + free T4  Bilateral impacted cerumen  Other orders -     rosuvastatin (CRESTOR) 5 MG tablet; Take 1 tablet (5 mg total) by mouth daily. (new directions)  Allergies as of 08/12/2021       Reactions   Cantaloupe Extract Allergy Skin Test Shortness Of Breath   Contrast Media [iodinated Diagnostic Agents] Shortness Of Breath, Rash   Pravastatin Other (See Comments)   Legs hurt   Zosyn [piperacillin Sod-tazobactam So] Rash, Other (See Comments)   Temperature increase, facial flushing   Latex Rash   Redness, itch        Medication List        Accurate as of August 12, 2021  8:54 AM. If you have any questions, ask your nurse or doctor.          acetaminophen 500 MG tablet Commonly known as: TYLENOL Take 1,000 mg by mouth  every 6 (six) hours as needed for moderate pain.   cholecalciferol 1000 units tablet Commonly known as: VITAMIN D Take 1 tablet (1,000 Units total) by mouth daily.   DSS 100 MG Caps Take by mouth.   furosemide 20 MG tablet Commonly known as: LASIX TAKE ONE (1) TABLET BY MOUTH EVERY DAY   letrozole 2.5 MG tablet Commonly known as: FEMARA TAKE ONE (1) TABLET EACH DAY   levETIRAcetam 500 MG tablet Commonly known as: KEPPRA TAKE ONE TABLET BY MOUTH TWICE DAILY   levothyroxine 100 MCG tablet Commonly known as: SYNTHROID TAKE ONE TABLET EACH MORNING BEFORE BREAKFAST   lidocaine-prilocaine cream Commonly known as: EMLA APPLY TOPICALLY AS NEEDED FOR PORT ACCESS   metoprolol succinate 100 MG 24 hr tablet Commonly known as: TOPROL-XL Take with or immediately following a meal. What changed: See the new instructions. Changed by: [name], MD   multivitamin with minerals tablet Take 1 tablet by mouth daily.   ondansetron 4 MG disintegrating tablet Commonly known as: ZOFRAN-ODT TAKE 1 TABLET EVERY 6 HOURS AS NEEDED FOR NAUSEA AND VOMITING   potassium chloride SA 20 MEQ tablet Commonly known as: KLOR-CON Take 1 tablet (20 mEq total) by mouth 2 (two) times daily for 7 days.   predniSONE 50 MG tablet Commonly known as: DELTASONE Patient to take 13 hours, 7 hours, and 1 hour prior to CT scan   rosuvastatin 5 MG tablet Commonly known as: CRESTOR Take 1 tablet (5 mg total) by mouth daily. (new directions)   triamcinolone 0.025 % ointment Commonly known as: KENALOG Apply 1 application topically 2 (two) times daily.   valsartan 80 MG tablet Commonly known as: DIOVAN TAKE ONE (1) TABLET EACH DAY   Verzenio 50 MG tablet Generic drug: abemaciclib Take 1 tablet (50 mg total) by mouth 2 (two) times daily.   VITAMIN C PO Take by mouth.        Meds ordered this encounter  Medications   metoprolol succinate (TOPROL-XL) 100 MG 24 hr tablet    Sig: Take with or  immediately following a meal.  Dispense:  90 tablet    Refill:  3   rosuvastatin (CRESTOR) 5 MG tablet    Sig: Take 1 tablet (5 mg total) by mouth daily. (new directions)    Dispense:  90 tablet    Refill:  3   valsartan (DIOVAN) 80 MG tablet    Sig: TAKE ONE (1) TABLET EACH DAY    Dispense:  90 tablet    Refill:  3    We discussed constipation and related symptoms. Not necessary to have a bowel movement daily unless symptomatic with bloating or other discomfort. Consider OTC colace.  Follow-up: Return in about 6 months (around 02/10/2022).  [name], M.D.

## 2021-08-13 ENCOUNTER — Encounter: Payer: Self-pay | Admitting: Hematology and Oncology

## 2021-08-13 LAB — CMP14+EGFR
ALT: 24 IU/L (ref 0–32)
AST: 66 IU/L — ABNORMAL HIGH (ref 0–40)
Albumin/Globulin Ratio: 1.4 (ref 1.2–2.2)
Albumin: 3.8 g/dL (ref 3.8–4.8)
Alkaline Phosphatase: 402 IU/L — ABNORMAL HIGH (ref 44–121)
BUN/Creatinine Ratio: 16 (ref 12–28)
BUN: 12 mg/dL (ref 8–27)
Bilirubin Total: 0.9 mg/dL (ref 0.0–1.2)
CO2: 28 mmol/L (ref 20–29)
Calcium: 9.7 mg/dL (ref 8.7–10.3)
Chloride: 99 mmol/L (ref 96–106)
Creatinine, Ser: 0.76 mg/dL (ref 0.57–1.00)
Globulin, Total: 2.7 g/dL (ref 1.5–4.5)
Glucose: 86 mg/dL (ref 70–99)
Potassium: 3.7 mmol/L (ref 3.5–5.2)
Sodium: 140 mmol/L (ref 134–144)
Total Protein: 6.5 g/dL (ref 6.0–8.5)
eGFR: 85 mL/min/{1.73_m2} (ref >59)

## 2021-08-13 LAB — CBC WITH DIFFERENTIAL/PLATELET
Basophils Absolute: 0.1 10*3/uL (ref 0.0–0.2)
Basos: 2 %
EOS (ABSOLUTE): 0.1 10*3/uL (ref 0.0–0.4)
Eos: 2 %
Hematocrit: 33.5 % — ABNORMAL LOW (ref 34.0–46.6)
Hemoglobin: 11.8 g/dL (ref 11.1–15.9)
Immature Grans (Abs): 0 10*3/uL (ref 0.0–0.1)
Immature Granulocytes: 0 %
Lymphocytes Absolute: 1.5 10*3/uL (ref 0.7–3.1)
Lymphs: 43 %
MCH: 36.5 pg — ABNORMAL HIGH (ref 26.6–33.0)
MCHC: 35.2 g/dL (ref 31.5–35.7)
MCV: 104 fL — ABNORMAL HIGH (ref 79–97)
Monocytes Absolute: 0.4 10*3/uL (ref 0.1–0.9)
Monocytes: 11 %
Neutrophils Absolute: 1.4 10*3/uL (ref 1.4–7.0)
Neutrophils: 42 %
Platelets: 122 10*3/uL — ABNORMAL LOW (ref 150–450)
RBC: 3.23 x10E6/uL — ABNORMAL LOW (ref 3.77–5.28)
RDW: 13.5 % (ref 11.7–15.4)
WBC: 3.4 10*3/uL (ref 3.4–10.8)

## 2021-08-13 LAB — LIPID PANEL
Chol/HDL Ratio: 3.2 ratio (ref 0.0–4.4)
Cholesterol, Total: 230 mg/dL — ABNORMAL HIGH (ref 100–199)
HDL: 72 mg/dL (ref >39)
LDL Chol Calc (NIH): 137 mg/dL — ABNORMAL HIGH (ref 0–99)
Triglycerides: 119 mg/dL (ref 0–149)
VLDL Cholesterol Cal: 21 mg/dL (ref 5–40)

## 2021-08-13 LAB — TSH+FREE T4
Free T4: 1.61 ng/dL (ref 0.82–1.77)
TSH: 1.28 u[IU]/mL (ref 0.450–4.500)

## 2021-08-18 LAB — SPECIMEN STATUS REPORT

## 2021-08-18 LAB — NUCLEOTIDASE, 5', BLOOD: 5-Nucleotidase: 38 IU/L — ABNORMAL HIGH (ref 0–10)

## 2021-08-23 ENCOUNTER — Telehealth: Payer: Self-pay

## 2021-08-23 ENCOUNTER — Other Ambulatory Visit (HOSPITAL_COMMUNITY): Payer: Self-pay

## 2021-08-23 ENCOUNTER — Encounter: Payer: Self-pay | Admitting: Hematology and Oncology

## 2021-08-23 NOTE — Telephone Encounter (Signed)
Oral Oncology Patient Advocate Encounter   Was successful in securing patient an $35 grant from [facility] [employer] [facility]) to provide copayment coverage for [employer].  This will keep the out of pocket expense at $0.     I have spoken with the patient.    The billing information is as follows and has been shared with [place].   Member ID: [id number] Group ID: [id number] RxBin: [id number] Dates of Eligibility: 05/25/21 through 05/24/22  Fund:  [place] [facility] [place] Phone [phone] Fax [phone] 08/23/2021 4:29 PM

## 2021-08-25 ENCOUNTER — Telehealth: Payer: Self-pay | Admitting: Pharmacy Technician

## 2021-08-25 NOTE — Progress Notes (Addendum)
Patient Care Team: [name], MD as PCP - General (Family Medicine) Minus Breeding, MD as PCP - Cardiology (Cardiology) Nicholas Lose, MD as Consulting Physician (Hematology and Oncology)  DIAGNOSIS:    ICD-10-CM   1. Malignant neoplasm of upper-outer quadrant of left breast in female, estrogen receptor positive ([place])  C50.412    Z17.0       SUMMARY OF ONCOLOGIC HISTORY: Oncology History  Breast cancer of upper-outer quadrant of left female breast ([name])  11/14/2011 Surgery   Bilateral mastectomy, prophylactic on the right, left breast IDC 3/18 lymph nodes positive with extracapsular extension ER 89%, PR 81%, HER-2 negative, Ki-67 79% T2 N1 A. stage IIB   12/13/2011 - 06/28/2012 Chemotherapy   4 cycles of FEC followed by 4 cycles of Taxotere   07/17/2012 - 08/22/2012 Radiation Therapy   Adjuvant radiation therapy   08/22/2012 - 03/16/2016 Anti-estrogen oral therapy   Arimidex 1 mg daily   03/16/2016 Relapse/Recurrence   Subcutaneous nodule excision left chest: Infiltrating carcinoma breast primary, ER positive, PR negative   03/29/2016 Imaging   CT CAP and bone scan: Lytic lesions T8 vertebral, T1 posterior element, subcutaneous nodule left lateral chest wall, nonspecific lung nodules; Bone scan: Mets to kull, left humerus, left eighth rib, T7/T8, sternum, left acetabulum   04/28/2016 - 06/17/2017 Chemotherapy   Herceptin, lapatinib, Faslodex, Zometa every 4 weeks, lapatinib discontinued in September 2018 due to elevation of LFTs   06/22/2017 Relapse/Recurrence   Surgical excision:Soft tissue mass left lateral chest wall primary breast cancer, soft tissue mass left medial chest wall breast cancer, tumor is within the dermis extending to the subcutaneous adipose tissue and involves portions of skeletal muscle   06/22/2017 Cancer Staging   Staging form: Breast, AJCC 7th Edition - Pathologic stage from 06/22/2017: Stage IV (TX, NX, M1) - Signed by Nicholas Lose, MD on 12/27/2019     08/2017 - 02/16/2018 Chemotherapy   Faslodex with Herceptin and Perjeta along with Zometa every 4 weeks   03/02/2018 - 05/25/2018 Chemotherapy   Kadcyla   05/11/2018 Imaging   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 Associated vasogenic edema within the underlying right cerebral hemisphere without significant midline shift. Signal abnormality throughout the visualized bone marrow, compatible with osseous metastatic disease.    06/04/2018 Imaging   CT CAP: Right lower lobe lung nodule 7 mm (was 5 mm); multiple bone metastases throughout the spine and ribs sternum scapula and humerus, slightly increased lower thoracic mets, right renal lesion 2.5 cm (was 1.2 cm) right femur met increased from 2.1 cm to 2.9 cm   06/15/2018 -  Anti-estrogen oral therapy   Abemaciclib, Herceptin, letrozole, Xgeva   06/07/2019 Imaging   Progression of brain metastases,2 discrete dural-based lesions involving the posterior right frontal lobe. The more anterior lesion now measures 16.5 x 17.5 x 14 mm. The posterior lesion now measures 9.5 x 13 x 20 mm.  Vasogenic edema of the frontal and parietal lobes   06/21/2019 - 06/27/2019 Radiation Therapy   SRS to the brain   07/19/2020 Relapse/Recurrence   Left mastectomy scar nodule: Biopsy invasive carcinoma ER 60%, PR 0%, HER-2 negative by [facility]   07/29/2020 Surgery   Soft tissue mass left chest wall excision: No evidence of malignancy.   11/27/2020 - 02/05/2021 Chemotherapy      Patient is on Antibody Plan: BREAST ADO-TRASTUZUMAB EMTANSINE [facility]) Q21D     01/15/2021 -  Chemotherapy   Patient is on Treatment Plan : BREAST ADO-Trastuzumab Emtansine (Kadcyla) q21d  Metastatic breast cancer ([name])  06/29/2017 Initial Diagnosis   Metastatic breast cancer ([name])   06/15/2018 - 12/25/2020 Chemotherapy    Patient is on Treatment Plan: BRAIN GBM BEVACIZUMAB 14D X 6 CYCLES       01/15/2021 -  Chemotherapy   Patient is on Treatment Plan : BREAST  ADO-Trastuzumab Emtansine (Kadcyla) q21d     Brain metastasis ([name])  07/13/2018 Initial Diagnosis   Brain metastasis ([name])   11/27/2020 - 02/05/2021 Chemotherapy      Patient is on Antibody Plan: BREAST ADO-TRASTUZUMAB EMTANSINE (KADCYLA) Q21D       CHIEF COMPLIANT: Cycle 11 Kadcyla  INTERVAL HISTORY: [name] Riley is a 70 y.o. with above-mentioned history of metastatic breast cancer currently on abemaciclib with letrozole along with Herceptin, Avastin, and Xgeva. She presents to the clinic today for cycle 11 of Kadcyla.   ALLERGIES:  is allergic to cantaloupe extract allergy skin test, contrast media [iodinated diagnostic agents], pravastatin, zosyn [piperacillin sod-tazobactam so], and latex.  MEDICATIONS:  Current Outpatient Medications  Medication Sig Dispense Refill   abemaciclib (VERZENIO) 50 MG tablet Take 1 tablet (50 mg total) by mouth 2 (two) times daily. 56 tablet 3   acetaminophen (TYLENOL) 500 MG tablet Take 1,000 mg by mouth every 6 (six) hours as needed for moderate pain.     Ascorbic Acid (VITAMIN C PO) Take by mouth.     cholecalciferol (VITAMIN D) 1000 units tablet Take 1 tablet (1,000 Units total) by mouth daily.     Docusate Sodium (DSS) 100 MG CAPS Take by mouth.     furosemide (LASIX) 20 MG tablet TAKE ONE (1) TABLET BY MOUTH EVERY DAY 30 tablet 1   letrozole (FEMARA) 2.5 MG tablet TAKE ONE (1) TABLET EACH DAY 90 tablet 3   levETIRAcetam (KEPPRA) 500 MG tablet TAKE ONE TABLET BY MOUTH TWICE DAILY 60 tablet 3   levothyroxine (SYNTHROID) 100 MCG tablet TAKE ONE TABLET EACH MORNING BEFORE BREAKFAST 90 tablet 2   lidocaine-prilocaine (EMLA) cream APPLY TOPICALLY AS NEEDED FOR PORT ACCESS 30 g 3   metoprolol succinate (TOPROL-XL) 100 MG 24 hr tablet Take with or immediately following a meal. 90 tablet 3   Multiple Vitamins-Minerals (MULTIVITAMIN WITH MINERALS) tablet Take 1 tablet by mouth daily.     ondansetron (ZOFRAN-ODT) 4 MG disintegrating tablet TAKE 1 TABLET  EVERY 6 HOURS AS NEEDED FOR NAUSEA AND VOMITING 30 tablet 3   potassium chloride SA (KLOR-CON) 20 MEQ tablet Take 1 tablet (20 mEq total) by mouth 2 (two) times daily for 7 days. 14 tablet 0   predniSONE (DELTASONE) 50 MG tablet Patient to take 13 hours, 7 hours, and 1 hour prior to CT scan 3 tablet 0   rosuvastatin (CRESTOR) 5 MG tablet Take 1 tablet (5 mg total) by mouth daily. (new directions) 90 tablet 3   triamcinolone (KENALOG) 0.025 % ointment Apply 1 application topically 2 (two) times daily. 80 g 1   valsartan (DIOVAN) 80 MG tablet TAKE ONE (1) TABLET EACH DAY 90 tablet 3   No current facility-administered medications for this visit.   Facility-Administered Medications Ordered in Other Visits  Medication Dose Route Frequency Provider Last Rate Last Admin   sodium chloride flush (NS) 0.9 % injection 10 mL  10 mL Intracatheter PRN Nicholas Lose, MD   10 mL at 05/03/21 1653    PHYSICAL EXAMINATION: ECOG PERFORMANCE STATUS: 1 - Symptomatic but completely ambulatory  Vitals:   08/27/21 1028  BP: 135/64  Pulse: 81  Resp:  16  Temp: 98.1 F (36.7 C)  SpO2: 99%   Filed Weights   08/27/21 1028  Weight: 132 lb 4.8 oz (60 kg)    LABORATORY DATA:  I have reviewed the data as listed CMP Latest Ref Rng & Units 08/12/2021 07/30/2021 07/02/2021  Glucose 70 - 99 mg/dL 86 132(H) 93  BUN 8 - 27 mg/dL 12 14 14   Creatinine 0.57 - 1.00 mg/dL 0.76 0.63 0.75  Sodium 134 - 144 mmol/L 140 135 137  Potassium 3.5 - 5.2 mmol/L 3.7 3.3(L) 3.3(L)  Chloride 96 - 106 mmol/L 99 98 101  CO2 20 - 29 mmol/L 28 28 27   Calcium 8.7 - 10.3 mg/dL 9.7 9.4 9.5  Total Protein 6.0 - 8.5 g/dL 6.5 6.7 6.5  Total Bilirubin 0.0 - 1.2 mg/dL 0.9 1.1 1.0  Alkaline Phos 44 - 121 IU/L 402(H) 332(H) 253(H)  AST 0 - 40 IU/L 66(H) 70(H) 62(H)  ALT 0 - 32 IU/L 24 32 25    Lab Results  Component Value Date   WBC 3.2 (L) 08/27/2021   HGB 10.4 (L) 08/27/2021   HCT 30.2 (L) 08/27/2021   MCV 107.9 (H) 08/27/2021   PLT  96 (L) 08/27/2021   NEUTROABS 1.3 (L) 08/27/2021    ASSESSMENT & PLAN:  Breast cancer of upper-outer quadrant of left female breast ([place]) Current Treatment: Kadcyla q [redacted] weeks along with Verzenio and letrozole. (she completed bevacizumab x4) (Prior treatment abemaciclib with letrozole and Herceptin)  Biopsy of the liver lesion.  12/11/2020: Metastatic carcinoma, ER 75% week to moderate, PR 0%, Ki-67 25%, HER-2 equivocal by IHC, FISH positive ratio 2.97, copy #5.2   Kadcyla toxicities:  1.  Fatigue as a result of chemotherapy: Stable 2. chemo induced anemia: Monitoring closely, today's hemoglobin is 11.3 3. peripheral neuropathy: Because of worsening peripheral neuropathy,  Kadcyla is now every 4 weeks and we also changed Verzenio dosage to 50 mg twice daily from October 2022.    4.  Emergency room visit for leg discomfort: Hypokalemic sent home with potassium replacement therapy 5.  Leukopenia: Related to Verzenio, stable 6.  Thrombocytopenia: Platelets 105   Nonhealing wound on the right leg: Wound care is assisting her Easy bruising on the hands: Possibly due to thrombocytopenia     CT CAP 04/02/21: Unchanged  Hepatic and osseous disease, next scans will be done in December 2022 MRI brain 06/04/2021: Stable, no interval change in the ill-defined areas of enhancement in the right frontal and parietal lobes, unchanged right cerebral hemisphere and unchanged dural based nodular enhancement over right anterior temporal lobe, unchanged bone metastases   Return to clinic every 3 weeks for Kadcyla Next CT scan will be done in December    No orders of the defined types were placed in this encounter.  The patient has a good understanding of the overall plan. she agrees with it. she will call with any problems that may develop before the next visit here.  Total time spent: 30 mins including face to face time and time spent for planning, charting and coordination of care  Rulon Eisenmenger, MD,  MPH 08/27/2021  I, Thana Ates, am acting as scribe for Dr. Nicholas Lose.  I have reviewed the above documentation for accuracy and completeness, and I agree with the above.

## 2021-08-26 NOTE — Telephone Encounter (Signed)
Oral Oncology Patient Advocate Encounter   Was successful in securing patient a $6000 grant from [place] to provide copayment coverage for [place].  This will keep the out of pocket expense at $0.    The billing information is as follows and has been shared with [place].   Member ID: [id number] Group ID: [id number] RxBin: [id number] PCN: [id number] Dates of Eligibility: 08/25/21 through 08/25/22   Fund name:  Metastatic Breast [place] Patient [place] Phone [phone] Fax [phone] 08/26/2021 12:39 PM

## 2021-08-27 ENCOUNTER — Inpatient Hospital Stay: Payer: Medicare Other | Attending: Hematology and Oncology

## 2021-08-27 ENCOUNTER — Other Ambulatory Visit: Payer: Self-pay

## 2021-08-27 ENCOUNTER — Inpatient Hospital Stay (HOSPITAL_BASED_OUTPATIENT_CLINIC_OR_DEPARTMENT_OTHER): Payer: Medicare Other | Admitting: Hematology and Oncology

## 2021-08-27 ENCOUNTER — Encounter: Payer: Self-pay | Admitting: *Deleted

## 2021-08-27 ENCOUNTER — Other Ambulatory Visit: Payer: Self-pay | Admitting: *Deleted

## 2021-08-27 ENCOUNTER — Inpatient Hospital Stay: Payer: Medicare Other

## 2021-08-27 ENCOUNTER — Other Ambulatory Visit (HOSPITAL_COMMUNITY): Payer: Self-pay

## 2021-08-27 DIAGNOSIS — R5383 Other fatigue: Secondary | ICD-10-CM | POA: Insufficient documentation

## 2021-08-27 DIAGNOSIS — Z170 Estrogen receptor positive status [ER+]: Secondary | ICD-10-CM | POA: Insufficient documentation

## 2021-08-27 DIAGNOSIS — Z923 Personal history of irradiation: Secondary | ICD-10-CM | POA: Diagnosis not present

## 2021-08-27 DIAGNOSIS — C7931 Secondary malignant neoplasm of brain: Secondary | ICD-10-CM | POA: Diagnosis not present

## 2021-08-27 DIAGNOSIS — Z5112 Encounter for antineoplastic immunotherapy: Secondary | ICD-10-CM | POA: Diagnosis not present

## 2021-08-27 DIAGNOSIS — Z95828 Presence of other vascular implants and grafts: Secondary | ICD-10-CM

## 2021-08-27 DIAGNOSIS — Z79811 Long term (current) use of aromatase inhibitors: Secondary | ICD-10-CM | POA: Diagnosis not present

## 2021-08-27 DIAGNOSIS — Z5181 Encounter for therapeutic drug level monitoring: Secondary | ICD-10-CM

## 2021-08-27 DIAGNOSIS — D6481 Anemia due to antineoplastic chemotherapy: Secondary | ICD-10-CM | POA: Diagnosis not present

## 2021-08-27 DIAGNOSIS — Z9012 Acquired absence of left breast and nipple: Secondary | ICD-10-CM | POA: Diagnosis not present

## 2021-08-27 DIAGNOSIS — C7951 Secondary malignant neoplasm of bone: Secondary | ICD-10-CM | POA: Diagnosis not present

## 2021-08-27 DIAGNOSIS — C50919 Malignant neoplasm of unspecified site of unspecified female breast: Secondary | ICD-10-CM

## 2021-08-27 DIAGNOSIS — G629 Polyneuropathy, unspecified: Secondary | ICD-10-CM | POA: Insufficient documentation

## 2021-08-27 DIAGNOSIS — T451X5A Adverse effect of antineoplastic and immunosuppressive drugs, initial encounter: Secondary | ICD-10-CM | POA: Insufficient documentation

## 2021-08-27 DIAGNOSIS — Z9221 Personal history of antineoplastic chemotherapy: Secondary | ICD-10-CM | POA: Insufficient documentation

## 2021-08-27 DIAGNOSIS — Z79899 Other long term (current) drug therapy: Secondary | ICD-10-CM

## 2021-08-27 DIAGNOSIS — D72819 Decreased white blood cell count, unspecified: Secondary | ICD-10-CM | POA: Insufficient documentation

## 2021-08-27 DIAGNOSIS — D696 Thrombocytopenia, unspecified: Secondary | ICD-10-CM | POA: Insufficient documentation

## 2021-08-27 DIAGNOSIS — C50412 Malignant neoplasm of upper-outer quadrant of left female breast: Secondary | ICD-10-CM | POA: Diagnosis not present

## 2021-08-27 LAB — CBC WITH DIFFERENTIAL (CANCER CENTER ONLY)
Abs Immature Granulocytes: 0.01 10*3/uL (ref 0.00–0.07)
Basophils Absolute: 0 10*3/uL (ref 0.0–0.1)
Basophils Relative: 1 %
Eosinophils Absolute: 0 10*3/uL (ref 0.0–0.5)
Eosinophils Relative: 1 %
HCT: 30.2 % — ABNORMAL LOW (ref 36.0–46.0)
Hemoglobin: 10.4 g/dL — ABNORMAL LOW (ref 12.0–15.0)
Immature Granulocytes: 0 %
Lymphocytes Relative: 45 %
Lymphs Abs: 1.4 10*3/uL (ref 0.7–4.0)
MCH: 37.1 pg — ABNORMAL HIGH (ref 26.0–34.0)
MCHC: 34.4 g/dL (ref 30.0–36.0)
MCV: 107.9 fL — ABNORMAL HIGH (ref 80.0–100.0)
Monocytes Absolute: 0.4 10*3/uL (ref 0.1–1.0)
Monocytes Relative: 11 %
Neutro Abs: 1.3 10*3/uL — ABNORMAL LOW (ref 1.7–7.7)
Neutrophils Relative %: 42 %
Platelet Count: 96 10*3/uL — ABNORMAL LOW (ref 150–400)
RBC: 2.8 MIL/uL — ABNORMAL LOW (ref 3.87–5.11)
RDW: 14.4 % (ref 11.5–15.5)
WBC Count: 3.2 10*3/uL — ABNORMAL LOW (ref 4.0–10.5)
nRBC: 0 % (ref 0.0–0.2)

## 2021-08-27 LAB — CMP (CANCER CENTER ONLY)
ALT: 28 U/L (ref 0–44)
AST: 67 U/L — ABNORMAL HIGH (ref 15–41)
Albumin: 3.3 g/dL — ABNORMAL LOW (ref 3.5–5.0)
Alkaline Phosphatase: 365 U/L — ABNORMAL HIGH (ref 38–126)
Anion gap: 8 (ref 5–15)
BUN: 16 mg/dL (ref 8–23)
CO2: 28 mmol/L (ref 22–32)
Calcium: 9.2 mg/dL (ref 8.9–10.3)
Chloride: 105 mmol/L (ref 98–111)
Creatinine: 0.72 mg/dL (ref 0.44–1.00)
GFR, Estimated: >60 mL/min (ref >60)
Glucose, Bld: 104 mg/dL — ABNORMAL HIGH (ref 70–99)
Potassium: 3.6 mmol/L (ref 3.5–5.1)
Sodium: 141 mmol/L (ref 135–145)
Total Bilirubin: 1 mg/dL (ref 0.3–1.2)
Total Protein: 6.5 g/dL (ref 6.5–8.1)

## 2021-08-27 MED ORDER — DIPHENHYDRAMINE HCL 25 MG PO CAPS
50.0000 mg | ORAL_CAPSULE | Freq: Once | ORAL | Status: AC
  Administered 2021-08-27: 50 mg via ORAL
  Filled 2021-08-27: qty 2

## 2021-08-27 MED ORDER — SODIUM CHLORIDE 0.9% FLUSH
10.0000 mL | Freq: Once | INTRAVENOUS | Status: AC
  Administered 2021-08-27: 10 mL

## 2021-08-27 MED ORDER — SODIUM CHLORIDE 0.9 % IV SOLN
Freq: Once | INTRAVENOUS | Status: AC
Start: 2021-08-27 — End: 2021-08-27

## 2021-08-27 MED ORDER — DIPHENHYDRAMINE HCL 50 MG/ML IJ SOLN: 25.0000 mg | Freq: Once | INTRAMUSCULAR | Status: DC | PRN

## 2021-08-27 MED ORDER — DIPHENHYDRAMINE HCL 50 MG/ML IJ SOLN: 50.0000 mg | Freq: Once | INTRAMUSCULAR | Status: DC | PRN

## 2021-08-27 MED ORDER — ACETAMINOPHEN 325 MG PO TABS
650.0000 mg | ORAL_TABLET | Freq: Once | ORAL | Status: AC
  Administered 2021-08-27: 650 mg via ORAL
  Filled 2021-08-27: qty 2

## 2021-08-27 MED ORDER — EPINEPHRINE 0.3 MG/0.3ML IJ SOAJ: 0.3000 mg | Freq: Once | INTRAMUSCULAR | Status: DC | PRN

## 2021-08-27 MED ORDER — SODIUM CHLORIDE 0.9 % IV SOLN
2.4000 mg/kg | Freq: Once | INTRAVENOUS | Status: AC
  Administered 2021-08-27: 140 mg via INTRAVENOUS
  Filled 2021-08-27: qty 7

## 2021-08-27 MED ORDER — SODIUM CHLORIDE 0.9 % IV SOLN
2.4000 mg/kg | Freq: Once | INTRAVENOUS | Status: DC
Start: 2021-08-27 — End: 2021-08-27

## 2021-08-27 MED ORDER — DENOSUMAB 120 MG/1.7ML ~~LOC~~ SOLN
120.0000 mg | Freq: Once | SUBCUTANEOUS | Status: AC
  Administered 2021-08-27: 120 mg via SUBCUTANEOUS
  Filled 2021-08-27: qty 1.7

## 2021-08-27 MED ORDER — ALBUTEROL SULFATE (2.5 MG/3ML) 0.083% IN NEBU: 2.5000 mg | INHALATION_SOLUTION | Freq: Once | RESPIRATORY_TRACT | Status: DC | PRN

## 2021-08-27 MED ORDER — HEPARIN SOD (PORK) LOCK FLUSH 100 UNIT/ML IV SOLN
500.0000 [IU] | Freq: Once | INTRAVENOUS | Status: AC
  Administered 2021-08-27: 500 [IU]

## 2021-08-27 MED ORDER — SODIUM CHLORIDE 0.9 % IV SOLN: Freq: Once | INTRAVENOUS | Status: DC | PRN

## 2021-08-27 MED ORDER — METHYLPREDNISOLONE SODIUM SUCC 125 MG IJ SOLR: 125.0000 mg | Freq: Once | INTRAMUSCULAR | Status: DC | PRN

## 2021-08-27 NOTE — Progress Notes (Signed)
Per MD okay to treat today with ANC 1.3 and plt 96.

## 2021-08-27 NOTE — Assessment & Plan Note (Signed)
Current Treatment:Kadcyla q4weeksalong with Verzenioand letrozole.(she completed bevacizumab x4) (Prior treatment abemaciclib with letrozole and Herceptin)  Biopsy of the liver lesion.12/11/2020: Metastatic carcinoma, ER 75% week to moderate, PR 0%, Ki-67 25%, HER-2 equivocal by IHC, FISH positive ratio 2.97, copy #5.2  Kadcyla toxicities: 1.Fatigue as a result of chemotherapy: Stable 2.chemo induced anemia: Monitoring closely, today's hemoglobin is 11.3 3.peripheral neuropathy: Because of worsening peripheral neuropathy,  Kadcyla is now every 4 weeks and we also changed Verzenio dosage to 50 mg twice daily from October 2022.   4.Emergency room visit for leg discomfort: Hypokalemic sent home with potassium replacement therapy 5.Leukopenia: Related to Verzenio, stable 6.Thrombocytopenia: Platelets105  Nonhealing wound on the right HDI:XBOER care is assisting her Easy bruising on the hands:Possibly due to thrombocytopenia   CT CAP 04/02/21: Unchanged Hepatic and osseous disease, next scans will be done in December 2022 MRI brain 06/04/2021: Stable, no interval change in the ill-defined areas of enhancement in the right frontal and parietal lobes, unchanged right cerebral hemisphere and unchanged dural based nodular enhancement over right anterior temporal lobe, unchanged bone metastases  Return to clinic every 4 weeks for Kadcyla Next CT scan will be done in December

## 2021-08-27 NOTE — Progress Notes (Signed)
Ok to administer Bradley today per Dr. Lindi Adie.  Kennith [place], Pharm.D., CPP 08/27/2021@1 :10 PM

## 2021-08-27 NOTE — Patient Instructions (Signed)
Ado-Trastuzumab Emtansine for injection What is this medication? ADO-TRASTUZUMAB EMTANSINE (ADD oh traz TOO zuh mab em TAN zine) is a monoclonal antibody combined with chemotherapy. It is used to treat breast cancer. This medicine may be used for other purposes; ask your health care provider or pharmacist if you have questions. COMMON BRAND NAME(S): Kadcyla What should I tell my care team before I take this medication? They need to know if you have any of these conditions: heart disease heart failure infection (especially a virus infection such as chickenpox, cold sores, or herpes) liver disease lung or breathing disease, like asthma tingling of the fingers or toes, or other nerve disorder an unusual or allergic reaction to ado-trastuzumab emtansine, other medications, foods, dyes, or preservatives pregnant or trying to get pregnant breast-feeding How should I use this medication? This medicine is for infusion into a vein. It is given by a health care professional in a hospital or clinic setting. Talk to your pediatrician regarding the use of this medicine in children. Special care may be needed. Overdosage: If you think you have taken too much of this medicine contact a poison control center or emergency room at once. NOTE: This medicine is only for you. Do not share this medicine with others. What if I miss a dose? It is important not to miss your dose. Call your doctor or health care professional if you are unable to keep an appointment. What may interact with this medication? This medicine may also interact with the following medications: atazanavir boceprevir clarithromycin delavirdine indinavir dalfopristin; quinupristin isoniazid, INH itraconazole ketoconazole nefazodone nelfinavir ritonavir telaprevir telithromycin tipranavir voriconazole This list may not describe all possible interactions. Give your health care provider a list of all the medicines, herbs,  non-prescription drugs, or dietary supplements you use. Also tell them if you smoke, drink alcohol, or use illegal drugs. Some items may interact with your medicine. What should I watch for while using this medication? Visit your doctor for checks on your progress. This drug may make you feel generally unwell. This is not uncommon, as chemotherapy can affect healthy cells as well as cancer cells. Report any side effects. Continue your course of treatment even though you feel ill unless your doctor tells you to stop. You may need blood work done while you are taking this medicine. Call your doctor or health care professional for advice if you get a fever, chills or sore throat, or other symptoms of a cold or flu. Do not treat yourself. This drug decreases your body's ability to fight infections. Try to avoid being around people who are sick. Be careful brushing and flossing your teeth or using a toothpick because you may get an infection or bleed more easily. If you have any dental work done, tell your dentist you are receiving this medicine. Avoid taking products that contain aspirin, acetaminophen, ibuprofen, naproxen, or ketoprofen unless instructed by your doctor. These medicines may hide a fever. Do not become pregnant while taking this medicine or for 7 months after stopping it, men with female partners should use contraception during treatment and for 4 months after the last dose. Women should inform their doctor if they wish to become pregnant or think they might be pregnant. There is a potential for serious side effects to an unborn child. Do not breast-feed an infant while taking this medicine or for 7 months after the last dose. Men who have a partner who is pregnant or who is capable of becoming pregnant should use a condom  during sexual activity while taking this medicine and for 4 months after stopping it. Men should inform their doctors if they wish to father a child. This medicine may lower  sperm counts. Talk to your health care professional or pharmacist for more information. What side effects may I notice from receiving this medication? Side effects that you should report to your doctor or health care professional as soon as possible: allergic reactions like skin rash, itching or hives, swelling of the face, lips, or tongue breathing problems chest pain or palpitations fever or chills, sore throat general ill feeling or flu-like symptoms light-colored stools nausea, vomiting pain, tingling, numbness in the hands or feet signs and symptoms of bleeding such as bloody or black, tarry stools; red or dark-brown urine; spitting up blood or brown material that looks like coffee grounds; red spots on the skin; unusual bruising or bleeding from the eye, gums, or nose swelling of the legs or ankles yellowing of the eyes or skin Side effects that usually do not require medical attention (report to your doctor or health care professional if they continue or are bothersome): changes in taste constipation dizziness headache joint pain muscle pain trouble sleeping unusually weak or tired This list may not describe all possible side effects. Call your doctor for medical advice about side effects. You may report side effects to FDA at 1-800-FDA-1088. Where should I keep my medication? This drug is given in a hospital or clinic and will not be stored at home. NOTE: This sheet is a summary. It may not cover all possible information. If you have questions about this medicine, talk to your doctor, pharmacist, or health care provider.  2022 Elsevier/Gold Standard (2018-10-23 00:00:00)

## 2021-08-27 NOTE — Progress Notes (Signed)
Kadcyla dose > 10% diff; adjust for wt change per Dr. Lindi Adie. New dose = 140 mg  Kennith Center, Pharm.D., CPP 08/27/2021@12 :29 PM

## 2021-08-28 ENCOUNTER — Emergency Department (HOSPITAL_COMMUNITY): Payer: Medicare Other

## 2021-08-28 ENCOUNTER — Emergency Department (HOSPITAL_COMMUNITY)
Admission: EM | Admit: 2021-08-28 | Discharge: 2021-08-28 | Disposition: A | Discharge status: Home / Self Care | Payer: Medicare Other | Attending: Emergency Medicine | Admitting: Emergency Medicine

## 2021-08-28 ENCOUNTER — Encounter (HOSPITAL_COMMUNITY): Payer: Self-pay | Admitting: Emergency Medicine

## 2021-08-28 DIAGNOSIS — Z79899 Other long term (current) drug therapy: Secondary | ICD-10-CM | POA: Diagnosis not present

## 2021-08-28 DIAGNOSIS — R569 Unspecified convulsions: Secondary | ICD-10-CM

## 2021-08-28 DIAGNOSIS — I2510 Atherosclerotic heart disease of native coronary artery without angina pectoris: Secondary | ICD-10-CM | POA: Diagnosis not present

## 2021-08-28 DIAGNOSIS — Z91040 Latex allergy status: Secondary | ICD-10-CM | POA: Insufficient documentation

## 2021-08-28 DIAGNOSIS — Z853 Personal history of malignant neoplasm of breast: Secondary | ICD-10-CM | POA: Diagnosis not present

## 2021-08-28 DIAGNOSIS — E039 Hypothyroidism, unspecified: Secondary | ICD-10-CM | POA: Insufficient documentation

## 2021-08-28 DIAGNOSIS — Z85841 Personal history of malignant neoplasm of brain: Secondary | ICD-10-CM | POA: Insufficient documentation

## 2021-08-28 DIAGNOSIS — C799 Secondary malignant neoplasm of unspecified site: Secondary | ICD-10-CM

## 2021-08-28 DIAGNOSIS — I10 Essential (primary) hypertension: Secondary | ICD-10-CM | POA: Insufficient documentation

## 2021-08-28 DIAGNOSIS — G936 Cerebral edema: Secondary | ICD-10-CM

## 2021-08-28 DIAGNOSIS — R251 Tremor, unspecified: Secondary | ICD-10-CM | POA: Diagnosis present

## 2021-08-28 LAB — COMPREHENSIVE METABOLIC PANEL
ALT: 29 U/L (ref 0–44)
AST: 73 U/L — ABNORMAL HIGH (ref 15–41)
Albumin: 3.2 g/dL — ABNORMAL LOW (ref 3.5–5.0)
Alkaline Phosphatase: 329 U/L — ABNORMAL HIGH (ref 38–126)
Anion gap: 8 (ref 5–15)
BUN: 17 mg/dL (ref 8–23)
CO2: 27 mmol/L (ref 22–32)
Calcium: 9 mg/dL (ref 8.9–10.3)
Chloride: 102 mmol/L (ref 98–111)
Creatinine, Ser: 0.69 mg/dL (ref 0.44–1.00)
GFR, Estimated: >60 mL/min (ref >60)
Glucose, Bld: 98 mg/dL (ref 70–99)
Potassium: 3.2 mmol/L — ABNORMAL LOW (ref 3.5–5.1)
Sodium: 137 mmol/L (ref 135–145)
Total Bilirubin: 1.1 mg/dL (ref 0.3–1.2)
Total Protein: 6.3 g/dL — ABNORMAL LOW (ref 6.5–8.1)

## 2021-08-28 LAB — CBC WITH DIFFERENTIAL/PLATELET
Abs Immature Granulocytes: 0.01 10*3/uL (ref 0.00–0.07)
Basophils Absolute: 0 10*3/uL (ref 0.0–0.1)
Basophils Relative: 1 %
Eosinophils Absolute: 0 10*3/uL (ref 0.0–0.5)
Eosinophils Relative: 2 %
HCT: 31.2 % — ABNORMAL LOW (ref 36.0–46.0)
Hemoglobin: 10.8 g/dL — ABNORMAL LOW (ref 12.0–15.0)
Immature Granulocytes: 0 %
Lymphocytes Relative: 35 %
Lymphs Abs: 0.9 10*3/uL (ref 0.7–4.0)
MCH: 38.6 pg — ABNORMAL HIGH (ref 26.0–34.0)
MCHC: 34.6 g/dL (ref 30.0–36.0)
MCV: 111.4 fL — ABNORMAL HIGH (ref 80.0–100.0)
Monocytes Absolute: 0.3 10*3/uL (ref 0.1–1.0)
Monocytes Relative: 14 %
Neutro Abs: 1.2 10*3/uL — ABNORMAL LOW (ref 1.7–7.7)
Neutrophils Relative %: 48 %
Platelets: 87 10*3/uL — ABNORMAL LOW (ref 150–400)
RBC: 2.8 MIL/uL — ABNORMAL LOW (ref 3.87–5.11)
RDW: 14.4 % (ref 11.5–15.5)
WBC: 2.5 10*3/uL — ABNORMAL LOW (ref 4.0–10.5)
nRBC: 0 % (ref 0.0–0.2)

## 2021-08-28 LAB — MAGNESIUM: Magnesium: 1.6 mg/dL — ABNORMAL LOW (ref 1.7–2.4)

## 2021-08-28 MED ORDER — SODIUM CHLORIDE 0.9 % IV BOLUS
1000.0000 mL | Freq: Once | INTRAVENOUS | Status: AC
  Administered 2021-08-28: 1000 mL via INTRAVENOUS

## 2021-08-28 MED ORDER — DEXAMETHASONE SODIUM PHOSPHATE 10 MG/ML IJ SOLN
6.0000 mg | Freq: Once | INTRAMUSCULAR | Status: AC
  Administered 2021-08-28: 6 mg via INTRAVENOUS
  Filled 2021-08-28: qty 1

## 2021-08-28 MED ORDER — DEXAMETHASONE 2 MG PO TABS: 2.0000 mg | ORAL_TABLET | Freq: Two times a day (BID) | ORAL | 0 refills | Status: DC

## 2021-08-28 MED ORDER — LORAZEPAM 2 MG/ML IJ SOLN
2.0000 mg | Freq: Once | INTRAMUSCULAR | Status: AC
  Administered 2021-08-28: 2 mg via INTRAVENOUS
  Filled 2021-08-28: qty 1

## 2021-08-28 MED ORDER — LEVETIRACETAM IN NACL 1500 MG/100ML IV SOLN
1500.0000 mg | Freq: Once | INTRAVENOUS | Status: AC
  Administered 2021-08-28: 1500 mg via INTRAVENOUS
  Filled 2021-08-28: qty 100

## 2021-08-28 NOTE — ED Notes (Signed)
Pt provided ginger ale, pt aware that she need to give a urine sample and to call staff when she is ready to do that

## 2021-08-28 NOTE — ED Triage Notes (Signed)
Pt brought in by [facility] after they were called out for seizure-like activity. Per EMS, upon their arrival, pt was having tremors on L side, was alert, had drawing up of her L hand, and felt like her speech was slurred.

## 2021-08-28 NOTE — ED Provider Notes (Signed)
CT imaging of the brain shows recurrence of edema of the brain.  Patient states that she was on steroids for the similar episode in the past but with improved imaging this past August was taken off steroids since August.  Patient loaded with Keppra here and given IV Decadron.  Observed for several hours with continued normal neuro exam at her baseline.  Case discussed with neurology, agreed to plan with continue Decadron at home.  Advising outpatient follow-up with her oncologist this week.  Advising immediate return for worsening symptoms or any additional concerns.    Luna Fuse, MD 08/28/21 [phone]

## 2021-08-28 NOTE — ED Notes (Signed)
Pt back from CT, spouse at bedside

## 2021-08-28 NOTE — ED Provider Notes (Signed)
[facility] EMERGENCY DEPARTMENT Provider Note   CSN: [id number] Arrival date & time: 08/28/21  0601     History Chief Complaint  Patient presents with   Tremors    [name] Riley is a 70 y.o. female.  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  Patient states that she woke up today with feeling abnormal and then having some shaking in her left hand states she did miss one of her doses of Keppra yesterday.  She states that she has had seizures in the past and there is similar to this.  She called EMS.  They gave her 2 mg of Versed which improved the shaking however she still has some tremors in the left hand at this time.  Eating drinking okay.  No recent illnesses.  No nausea vomiting or diarrhea.  No other associated symptoms.  No falls.  No fevers.       Past Medical History:  Diagnosis Date   Allergy    Breast cancer ([place]) 08/25/2011   L , invasive ductal carcinoma, ER/PR +,HER2 -   Cancer ([place])    left breast cancer   Coronary artery disease 2001   Heart attack [facility]) 09/2000   Sep 25, 2000  --no intervention   History of chemotherapy comp. 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   Brain metastasis ([place]) 07/13/2018   Goals of care, counseling/discussion 06/05/2018   UTI (urinary tract infection) 05/31/2018   Sepsis secondary to UTI ([place]) 05/31/2018   Thrombocytopenia ([place]) 05/31/2018   Hypokalemia 05/31/2018   Port-A-Cath in place 03/23/2018   Elevated LFTs    Metastatic breast cancer ([place]) 06/29/2017   Nodule of skin of breast 03/06/2017   Abscess,  abdomen 07/06/2016   Metastasis to bone ([place]) 06/01/2016   Pneumothorax on left 04/25/2016   Chest pain 10/23/2014   Radiation-induced dermatitis 07/27/2012   Hypertension    Breast cancer of upper-outer quadrant of left female breast ([place]) 09/01/2011   Hypothyroidism 06/12/2009   Mixed hyperlipidemia 06/12/2009   Essential hypertension 06/12/2009   Coronary atherosclerosis 06/12/2009   DIZZINESS 06/12/2009   PALPITATIONS 06/12/2009    Past Surgical History:  Procedure Laterality Date   ABDOMINAL HYSTERECTOMY  1998   TAH, oophorectomy   APPENDECTOMY  1970   BREAST CYST EXCISION Left 07/29/2020   Procedure: WIDE EXCISION OF LEFT MASTECTOMY INCISION;  Surgeon: Donnie Mesa, MD;  Location: [place];  Service: General;  Laterality: Left;   [place]   removal of benign lump in rt breast   BREAST SURGERY  11/14/11   right simple mastectomy, left mrm   INCISION AND DRAINAGE OF WOUND Left 07/01/2017   Procedure: IRRIGATION AND DEBRIDEMENT CHEST WALL ABSCESS;  Surgeon: Donnie Mesa, MD;  Location: [facility];  Service: General;  Laterality: Left;   MASS EXCISION Left 06/22/2017   Procedure: EXCISION OF CHEST WALL MASSES;  Surgeon: Donnie Mesa, MD;  Location: [place];  Service: General;  Laterality: Left;   MASTECTOMY Bilateral  for left breast cancer   OVARIAN CYST SURGERY Right 1970   PORT-A-CATH REMOVAL  08/30/2012   Procedure: REMOVAL PORT-A-CATH;  Surgeon: Imogene Burn. [name], MD;  Location: [place];  Service: General;  Laterality: Right;  port removal   PORTACATH PLACEMENT  11/14/2011   Procedure: INSERTION PORT-A-CATH;  Surgeon: Imogene Burn. [name], MD;  Location: [place];  Service: General;  Laterality: Right;   PORTACATH PLACEMENT N/A 04/25/2016   Procedure: INSERTION PORT-A-CATH LEFT CHEST;  Surgeon: Donnie Mesa, MD;  Location: [place];  Service: General;  Laterality: N/A;   skin tags  05/09/1997   left axillary left neck  skin tags   TONSILLECTOMY  1968     OB History     Gravida  4   Para      Term      Preterm      AB  1   Living  3      SAB  1   IAB      Ectopic      Multiple      Live Births              Family History  Problem Relation Age of Onset   Hypertension Maternal Grandmother    Diabetes Maternal Grandmother    Cancer Father 5       lung cancer and Prostate Cancer   Hypertension Mother    Cancer Paternal Aunt        ovarian   Cancer Cousin        breast, paternal cousin   Cancer Paternal Uncle        stomach   Cancer Paternal Grandfather        Esophagus   Colon cancer Neg Hx     Social History   Tobacco Use   Smoking status: Never   Smokeless tobacco: Never  Vaping Use   Vaping Use: Never used  Substance Use Topics   Alcohol use: No   Drug use: No    Home Medications Prior to Admission medications   Medication Sig Start Date End Date Taking? Authorizing Provider  abemaciclib (VERZENIO) 50 MG tablet Take 1 tablet (50 mg total) by mouth 2 (two) times daily. 07/02/21 07/02/22  Nicholas Lose, MD  acetaminophen (TYLENOL) 500 MG tablet Take 1,000 mg by mouth every 6 (six) hours as needed for moderate pain.    [provider]  Ascorbic Acid (VITAMIN C PO) Take by mouth.    [provider]  cholecalciferol (VITAMIN D) 1000 units tablet Take 1 tablet (1,000 Units total) by mouth daily. 04/13/18   Nicholas Lose, MD  Docusate Sodium (DSS) 100 MG CAPS Take by mouth.    [provider]  furosemide (LASIX) 20 MG tablet TAKE ONE (1) TABLET BY MOUTH EVERY DAY 02/08/21   Nicholas Lose, MD  letrozole [facility]) 2.5 MG tablet TAKE ONE (1) TABLET EACH DAY 04/26/21   Magrinat, Virgie Dad, MD  levETIRAcetam (KEPPRA) 500 MG tablet TAKE ONE TABLET BY MOUTH TWICE DAILY 06/02/21   Ventura Sellers, MD  levothyroxine (SYNTHROID) 100 MCG tablet TAKE ONE TABLET EACH MORNING BEFORE BREAKFAST 11/12/20   [name], MD  lidocaine-prilocaine (EMLA) cream  APPLY TOPICALLY AS NEEDED FOR PORT ACCESS 01/15/21   Harle Stanford., PA-C  metoprolol succinate (TOPROL-XL) 100 MG 24 hr tablet Take with or immediately following a meal. 08/12/21   [name], MD  Multiple Vitamins-Minerals (MULTIVITAMIN WITH MINERALS) tablet Take 1 tablet by  mouth daily.    [provider]  ondansetron (ZOFRAN-ODT) 4 MG disintegrating tablet TAKE 1 TABLET EVERY 6 HOURS AS NEEDED FOR NAUSEA AND VOMITING 05/07/21   Nicholas Lose, MD  potassium chloride SA (KLOR-CON) 20 MEQ tablet Take 1 tablet (20 mEq total) by mouth 2 (two) times daily for 7 days. 03/29/21 04/05/21  Dorie Rank, MD  predniSONE (DELTASONE) 50 MG tablet Patient to take 13 hours, 7 hours, and 1 hour prior to CT scan 03/29/21   Nicholas Lose, MD  rosuvastatin (CRESTOR) 5 MG tablet Take 1 tablet (5 mg total) by mouth daily. (new directions) 08/12/21   [name], MD  triamcinolone (KENALOG) 0.025 % ointment Apply 1 application topically 2 (two) times daily. 08/07/20   Nicholas Lose, MD  valsartan (DIOVAN) 80 MG tablet TAKE ONE (1) TABLET EACH DAY 08/12/21   [name], MD    Allergies    Cantaloupe extract allergy skin test, Contrast media [iodinated diagnostic agents], Pravastatin, Zosyn [piperacillin sod-tazobactam so], and Latex  Review of Systems   Review of Systems  All other systems reviewed and are negative.  Physical Exam Updated Vital Signs BP (!) 141/81 (BP Location: Right Arm)   Pulse (!) 102   Temp 98.4 F (36.9 C) (Oral)   Resp 15   Ht 5' 8"  (1.727 m)   Wt 60 kg   SpO2 97%   BMI 20.11 kg/m   Physical Exam Vitals and nursing note reviewed.  Constitutional:      Appearance: She is well-developed.  HENT:     Head: Normocephalic and atraumatic.     Mouth/Throat:     Mouth: Mucous membranes are moist.     Pharynx: Oropharynx is clear.  Eyes:     Pupils: Pupils are equal, round, and reactive to light.  Cardiovascular:     Rate and Rhythm: Normal rate and regular rhythm.   Pulmonary:     Effort: No respiratory distress.     Breath sounds: No stridor.  Abdominal:     General: Abdomen is flat. There is no distension.  Musculoskeletal:     Cervical back: Normal range of motion.  Neurological:     Mental Status: She is alert.     Comments: Seizure like activity in LUE    ED Results / Procedures / Treatments   Labs (all labs ordered are listed, but only abnormal results are displayed) Labs Reviewed  CBC WITH DIFFERENTIAL/PLATELET  COMPREHENSIVE METABOLIC PANEL  LEVETIRACETAM LEVEL    EKG None  Radiology No results found.  Procedures Procedures   Medications Ordered in ED Medications  levETIRAcetam (KEPPRA) IVPB 1500 mg/ 100 mL premix (1,500 mg Intravenous New Bag/Given 08/28/21 0623)  LORazepam (ATIVAN) injection 2 mg (2 mg Intravenous Given 08/28/21 0623)  sodium chloride 0.9 % bolus 1,000 mL (1,000 mLs Intravenous New Bag/Given 08/28/21 0712)    ED Course  I have reviewed the triage vital signs and the nursing notes.  Pertinent labs & imaging results that were available during my care of the patient were reviewed by me and considered in my medical decision making (see chart for details).    MDM Rules/Calculators/A&P                         Here with partial seizure of LUE. Will order keppra and ativan along with head CT to ensure not worsening. Will also check lytes/anemia/ua to ensure not contributing factors.   Care transferred pending workup. Seizure like activity  improved.   Final Clinical Impression(s) / ED Diagnoses Final diagnoses:  None    Rx / DC Orders ED Discharge Orders     None        Aaliyana Fredericks, Corene Cornea, MD 08/29/21 [phone]

## 2021-08-28 NOTE — ED Notes (Signed)
Pt gone to CT 

## 2021-08-28 NOTE — Discharge Instructions (Signed)
Call your oncologist as discussed in the next 2-3 days.   Return immediately back to the ER if:  Your symptoms worsen within the next 12-24 hours. You develop new symptoms such as new fevers, persistent vomiting, new pain, shortness of breath, or new weakness or numbness, or if you have any other concerns.

## 2021-08-30 ENCOUNTER — Telehealth: Payer: Self-pay | Admitting: Hematology and Oncology

## 2021-08-30 ENCOUNTER — Other Ambulatory Visit (HOSPITAL_COMMUNITY): Payer: Self-pay

## 2021-08-30 LAB — LEVETIRACETAM LEVEL: Levetiracetam Lvl: 17.2 ug/mL (ref 10.0–40.0)

## 2021-08-30 NOTE — Telephone Encounter (Signed)
Scheduled appointment per 11/11. Patient aware.

## 2021-08-31 ENCOUNTER — Inpatient Hospital Stay (HOSPITAL_BASED_OUTPATIENT_CLINIC_OR_DEPARTMENT_OTHER): Payer: Medicare Other | Admitting: Internal Medicine

## 2021-08-31 ENCOUNTER — Other Ambulatory Visit: Payer: Self-pay

## 2021-08-31 DIAGNOSIS — R569 Unspecified convulsions: Secondary | ICD-10-CM

## 2021-08-31 DIAGNOSIS — C7931 Secondary malignant neoplasm of brain: Secondary | ICD-10-CM | POA: Diagnosis not present

## 2021-08-31 NOTE — Progress Notes (Signed)
I connected with [name] Riley on 08/31/21 at 11:30 AM EST by telephone visit and verified that I am speaking with the correct person using two identifiers.  I discussed the limitations, risks, security and privacy concerns of performing an evaluation and management service by telemedicine and the availability of in-person appointments. I also discussed with the patient that there may be a patient responsible charge related to this service. The patient expressed understanding and agreed to proceed.  Other persons participating in the visit and their role in the encounter:  n/a  Patient's location:  Home  Provider's location:  Office  Chief Complaint:  Brain metastasis ([place])  Seizures ([place])  History of Present Ilness: [name] Riley presented to medical attention this past weekend with a breakthrough seizure, characterized by left sided shaking.  She returned to baseline after several hours and was discharged to home.  She feels back to baseline since then, no other complaints.  Observations: Language and cognition at baseline  Imaging:  [place] Clinician Interpretation: I have personally reviewed the CNS images as listed.  My interpretation, in the context of the patient's clinical presentation, is likely treatment effect  CT Head Wo Contrast  Result Date: 08/28/2021 CLINICAL DATA:  70 year old female with history of nontraumatic seizure. EXAM: CT HEAD WITHOUT CONTRAST TECHNIQUE: Contiguous axial images were obtained from the base of the skull through the vertex without intravenous contrast. COMPARISON:  Head CT 10/28/2020. FINDINGS: Brain: Again noted are extensive areas of low attenuation throughout the right frontal lobe and anterior right parietal lobe, decreased compared to prior head CT 10/28/2020, however, markedly increased when compared to more recent brain MRI 06/02/2021, likely reflective of improving vasogenic edema in the setting of known brain metastasis. There are some cortical and  subcortical calcifications in these regions which have increased compared to the prior study. There is little if any mass effect associated with this edema at this time. Patient is poorly positioned in the scanner, but if there is any midline shift it is 1-2 mm from right to left. There is also some low attenuation in the anterior aspect of the right temporal lobe, likely reflective of additional areas of vasogenic edema. No evidence of hydrocephalus, extra-axial collection or mass lesion/mass effect. Vascular: No hyperdense vessel or unexpected calcification. Skull: Widespread multifocal sclerotic lesions are noted throughout the visualized skull and upper cervical spine, compatible with extensive treated metastatic disease to the bones. Sinuses/Orbits: No acute finding. Other: None. IMPRESSION: 1. When compared to most recent brain MRI 06/02/2021, there is increasing vasogenic edema throughout the right frontal, anterior right parietal and right temporal lobes, likely secondary to underlying brain metastases. This could suggest progression of disease. Correlation with brain MRI with and without IV gadolinium should be considered if clinically appropriate. 2. Widespread metastatic disease to the skull and cervical spine redemonstrated. Electronically Signed   By: Vinnie Langton M.D.   On: 08/28/2021 07:06    Assessment and Plan: Brain metastasis ([place]) - Plan: MR BRAIN W WO CONTRAST  Seizures ([place])  Breakthrough seizure secondary to peritumoral edema seen on CT.  Suspect etiology is inflammatory rather than neoplastic, but unclear overall.   Recommended increasing Keppra to 1000mg  BID.    Decadron should continue 2mg  BID (started on 11/12 in ED).  Will need to obtain contrast enhanced MRI in 1-2 weeks to complete evaluation  Follow Up Instructions: RTC following MRI  I discussed the assessment and treatment plan with the patient.  The patient was provided an opportunity  to ask questions and all  were answered.  The patient agreed with the plan and demonstrated understanding of the instructions.    The patient was advised to call back or seek an in-person evaluation if the symptoms worsen or if the condition fails to improve as anticipated.  I provided 5-10 minutes of non-face-to-face time during this enocunter.  Ventura Sellers, MD   I provided 25 minutes of non face-to-face telephone visit time during this encounter, and > 50% was spent counseling as documented under my assessment & plan.

## 2021-09-02 ENCOUNTER — Other Ambulatory Visit: Payer: Self-pay

## 2021-09-03 ENCOUNTER — Telehealth: Payer: Self-pay | Admitting: Internal Medicine

## 2021-09-03 ENCOUNTER — Other Ambulatory Visit: Payer: Self-pay

## 2021-09-03 ENCOUNTER — Ambulatory Visit (HOSPITAL_COMMUNITY)
Admission: RE | Admit: 2021-09-03 | Discharge: 2021-09-03 | Disposition: A | Discharge status: Home / Self Care | Payer: Medicare Other | Source: Ambulatory Visit | Attending: Internal Medicine | Admitting: Internal Medicine

## 2021-09-03 DIAGNOSIS — C7931 Secondary malignant neoplasm of brain: Secondary | ICD-10-CM | POA: Diagnosis not present

## 2021-09-03 MED ORDER — GADOBUTROL 1 MMOL/ML IV SOLN
6.0000 mL | Freq: Once | INTRAVENOUS | Status: AC | PRN
  Administered 2021-09-03: 6 mL via INTRAVENOUS

## 2021-09-03 MED ORDER — HEPARIN SOD (PORK) LOCK FLUSH 100 UNIT/ML IV SOLN
500.0000 [IU] | INTRAVENOUS | Status: AC | PRN
  Administered 2021-09-03: 500 [IU]
  Filled 2021-09-03: qty 5

## 2021-09-03 NOTE — Telephone Encounter (Signed)
Scheduled per sch msg. Called and spoke with patient. Confirmed appt  

## 2021-09-06 ENCOUNTER — Inpatient Hospital Stay (HOSPITAL_BASED_OUTPATIENT_CLINIC_OR_DEPARTMENT_OTHER): Payer: Medicare Other | Admitting: Internal Medicine

## 2021-09-06 ENCOUNTER — Inpatient Hospital Stay: Payer: Medicare Other

## 2021-09-06 DIAGNOSIS — Y842 Radiological procedure and radiotherapy as the cause of abnormal reaction of the patient, or of later complication, without mention of misadventure at the time of the procedure: Secondary | ICD-10-CM | POA: Diagnosis not present

## 2021-09-06 DIAGNOSIS — I6789 Other cerebrovascular disease: Secondary | ICD-10-CM

## 2021-09-06 DIAGNOSIS — R569 Unspecified convulsions: Secondary | ICD-10-CM | POA: Diagnosis not present

## 2021-09-06 DIAGNOSIS — C7931 Secondary malignant neoplasm of brain: Secondary | ICD-10-CM | POA: Diagnosis not present

## 2021-09-06 MED ORDER — DEXAMETHASONE 2 MG PO TABS: 2.0000 mg | ORAL_TABLET | Freq: Two times a day (BID) | ORAL | 1 refills | Status: DC

## 2021-09-06 NOTE — Progress Notes (Signed)
I connected with [name] Riley on 09/06/21 at  2:00 PM EST by telephone visit and verified that I am speaking with the correct person using two identifiers.  I discussed the limitations, risks, security and privacy concerns of performing an evaluation and management service by telemedicine and the availability of in-person appointments. I also discussed with the patient that there may be a patient responsible charge related to this service. The patient expressed understanding and agreed to proceed.  Other persons participating in the visit and their role in the encounter:  n/a  Patient's location:  Home  Provider's location:  Office   Chief Complaint:  Brain metastasis ([place])  Seizures ([place])  History of Present Ilness: [name] Riley describes improvement in cognitive symptoms, weakness since continuing the decadron and increasing the keppra.  No further seizure episodes.  No other recent changes, continues on systemic therapy as prior with Dr. Lindi Adie.  Observations: Language and cognition at baseline  Imaging:  [facility] Clinician Interpretation: I have personally reviewed the CNS images as listed.  My interpretation, in the context of the patient's clinical presentation, is treatment effect vs true progression  CT Head Wo Contrast  Result Date: 08/28/2021 CLINICAL DATA:  70 year old female with history of nontraumatic seizure. EXAM: CT HEAD WITHOUT CONTRAST TECHNIQUE: Contiguous axial images were obtained from the base of the skull through the vertex without intravenous contrast. COMPARISON:  Head CT 10/28/2020. FINDINGS: Brain: Again noted are extensive areas of low attenuation throughout the right frontal lobe and anterior right parietal lobe, decreased compared to prior head CT 10/28/2020, however, markedly increased when compared to more recent brain MRI 06/02/2021, likely reflective of improving vasogenic edema in the setting of known brain metastasis. There are some cortical and subcortical  calcifications in these regions which have increased compared to the prior study. There is little if any mass effect associated with this edema at this time. Patient is poorly positioned in the scanner, but if there is any midline shift it is 1-2 mm from right to left. There is also some low attenuation in the anterior aspect of the right temporal lobe, likely reflective of additional areas of vasogenic edema. No evidence of hydrocephalus, extra-axial collection or mass lesion/mass effect. Vascular: No hyperdense vessel or unexpected calcification. Skull: Widespread multifocal sclerotic lesions are noted throughout the visualized skull and upper cervical spine, compatible with extensive treated metastatic disease to the bones. Sinuses/Orbits: No acute finding. Other: None. IMPRESSION: 1. When compared to most recent brain MRI 06/02/2021, there is increasing vasogenic edema throughout the right frontal, anterior right parietal and right temporal lobes, likely secondary to underlying brain metastases. This could suggest progression of disease. Correlation with brain MRI with and without IV gadolinium should be considered if clinically appropriate. 2. Widespread metastatic disease to the skull and cervical spine redemonstrated. Electronically Signed   By: Vinnie Langton M.D.   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 CONTRAST:  86mL GADAVIST GADOBUTROL 1 MMOL/ML IV SOLN COMPARISON:  06/02/2021 FINDINGS: Brain: Increase in size of irregular enhancement within the right frontal and parietal lobes underlying and contiguous with dural based  enhancement. The more anterior region abutting the precentral sulcus measures approximately  2 x 1.6 cm (remeasured previously as 1.5 x 1.2 cm). The more posterior region centered within the postcentral gyrus with some precentral involvement superiorly measures 1.3 x 1.4 cm (remeasured previously as 0.7 x 0.9 cm). There is substantial increase in surrounding edema in the frontoparietal white matter without significant mass effect. There is unchanged irregular dural thickening more inferior to this region (series 1200, image 93). Increased size of dural based enhancement along the anterior right temporal convexity now measuring 6 mm (previously 4 mm) on series 1200, image 170. Vascular: Major vessel flow voids at the skull base are preserved. Skull and upper cervical spine: Abnormal marrow signal reflecting known widespread sclerotic osseous metastatic disease. Sinuses/Orbits: Minor mucosal thickening.  Orbits are unremarkable. Other: Sella is unremarkable.  Mastoid air cells are clear. IMPRESSION: Increase in irregular parenchymal enhancement within the right frontoparietal lobes with substantially increased edema as described. Increase in size of subcentimeter dural-based enhancement along the anterior right temporal convexity. Otherwise stable irregular dural thickening along the right convexity. Parenchymal findings may reflect recurrence of radiation necrosis or invasion of parenchyma by dural disease. Given presumed prior radiation necrosis responsive to therapy and that the dural-based disease has not significantly progressed, invasion of parenchyma seems less likely. PET could be considered for differentiation. MR perfusion would likely be less helpful due to irregularity of enhancing regions and susceptibility in this region. Reviewed at brain conference 09/06/2021. Electronically Signed   By: Macy Mis M.D.   On: 09/06/2021 08:51    Assessment and Plan: Brain metastasis ([place])  Seizures  ([place])  Clinically stable to improved, seizure free after recent hospitalization.  MRI brain demonstrates progressive findings consistent with either recurrent radionecrosis (favored) or tumor recurrence, thought less likely.    Case was discussed in detail in brain/spine tumor board on 09/06/21, team recommended brain FDG-PET for metabolic characterization.  She is agreeable with this plan.    Recommended continuing Keppra 1000mg  BID, decadron 2mg  BID (since 08/28/21)   Follow Up Instructions: RTC following PET  I discussed the assessment and treatment plan with the patient.  The patient was provided an opportunity to ask questions and all were answered.  The patient agreed with the plan and demonstrated understanding of the instructions.    The patient was advised to call back or seek an in-person evaluation if the symptoms worsen or if the condition fails to improve as anticipated.  I provided 5-10 minutes of non-face-to-face time during this enocunter.  Ventura Sellers, MD   I provided 25 minutes of non face-to-face telephone visit time during this encounter, and > 50% was spent counseling as documented under my assessment & plan.

## 2021-09-07 ENCOUNTER — Other Ambulatory Visit: Payer: Self-pay | Admitting: *Deleted

## 2021-09-07 MED ORDER — PREDNISONE 50 MG PO TABS: ORAL_TABLET | ORAL | 0 refills | Status: DC

## 2021-09-07 NOTE — Progress Notes (Signed)
Per MD request pt with hx of contrast allergy.  Verbal orders received for pt to receive Prednisone 50 mg p.o 13 hrs prior to CT scan, 7 hours prior to CT scan, and 1 hour prior to CT scan along with Benadryl 50 mg 1 hour prior to CT can.  Pt educated and verbalized understanding.

## 2021-09-11 ENCOUNTER — Other Ambulatory Visit: Payer: Self-pay | Admitting: Family Medicine

## 2021-09-11 DIAGNOSIS — E039 Hypothyroidism, unspecified: Secondary | ICD-10-CM

## 2021-09-13 ENCOUNTER — Ambulatory Visit (HOSPITAL_COMMUNITY): Payer: Medicare Other

## 2021-09-13 ENCOUNTER — Telehealth: Payer: Self-pay

## 2021-09-13 ENCOUNTER — Other Ambulatory Visit: Payer: Self-pay | Admitting: Radiation Therapy

## 2021-09-13 NOTE — Telephone Encounter (Signed)
Return pt phone call regarding rescheduling appointments due to sudden changes in family.  Informed pt that scheduling team would call her to get everything rescheduled.  Pt verbalized understanding and thanks

## 2021-09-14 ENCOUNTER — Telehealth: Payer: Self-pay | Admitting: Internal Medicine

## 2021-09-14 NOTE — Telephone Encounter (Signed)
Scheduled per sch msg. Called, not able to leave msg

## 2021-09-15 ENCOUNTER — Ambulatory Visit (HOSPITAL_COMMUNITY)
Admission: RE | Admit: 2021-09-15 | Discharge: 2021-09-15 | Disposition: A | Discharge status: Home / Self Care | Payer: Medicare Other | Source: Ambulatory Visit | Attending: Internal Medicine | Admitting: Internal Medicine

## 2021-09-15 ENCOUNTER — Ambulatory Visit (HOSPITAL_COMMUNITY)
Admission: RE | Admit: 2021-09-15 | Discharge: 2021-09-15 | Disposition: A | Discharge status: Home / Self Care | Payer: Medicare Other | Source: Ambulatory Visit | Attending: Hematology and Oncology | Admitting: Hematology and Oncology

## 2021-09-15 DIAGNOSIS — K449 Diaphragmatic hernia without obstruction or gangrene: Secondary | ICD-10-CM | POA: Diagnosis not present

## 2021-09-15 DIAGNOSIS — K5900 Constipation, unspecified: Secondary | ICD-10-CM | POA: Diagnosis not present

## 2021-09-15 DIAGNOSIS — I700 Atherosclerosis of aorta: Secondary | ICD-10-CM | POA: Insufficient documentation

## 2021-09-15 DIAGNOSIS — Y842 Radiological procedure and radiotherapy as the cause of abnormal reaction of the patient, or of later complication, without mention of misadventure at the time of the procedure: Secondary | ICD-10-CM | POA: Diagnosis not present

## 2021-09-15 DIAGNOSIS — C7931 Secondary malignant neoplasm of brain: Secondary | ICD-10-CM | POA: Diagnosis not present

## 2021-09-15 DIAGNOSIS — C7951 Secondary malignant neoplasm of bone: Secondary | ICD-10-CM | POA: Insufficient documentation

## 2021-09-15 DIAGNOSIS — N3289 Other specified disorders of bladder: Secondary | ICD-10-CM | POA: Diagnosis not present

## 2021-09-15 DIAGNOSIS — K8020 Calculus of gallbladder without cholecystitis without obstruction: Secondary | ICD-10-CM | POA: Diagnosis not present

## 2021-09-15 DIAGNOSIS — C50412 Malignant neoplasm of upper-outer quadrant of left female breast: Secondary | ICD-10-CM | POA: Insufficient documentation

## 2021-09-15 DIAGNOSIS — R140 Abdominal distension (gaseous): Secondary | ICD-10-CM | POA: Insufficient documentation

## 2021-09-15 DIAGNOSIS — Z170 Estrogen receptor positive status [ER+]: Secondary | ICD-10-CM | POA: Diagnosis not present

## 2021-09-15 DIAGNOSIS — X58XXXA Exposure to other specified factors, initial encounter: Secondary | ICD-10-CM | POA: Insufficient documentation

## 2021-09-15 DIAGNOSIS — I6789 Other cerebrovascular disease: Secondary | ICD-10-CM | POA: Diagnosis not present

## 2021-09-15 DIAGNOSIS — C787 Secondary malignant neoplasm of liver and intrahepatic bile duct: Secondary | ICD-10-CM | POA: Insufficient documentation

## 2021-09-15 LAB — GLUCOSE, CAPILLARY: Glucose-Capillary: 112 mg/dL — ABNORMAL HIGH (ref 70–99)

## 2021-09-15 MED ORDER — HEPARIN SOD (PORK) LOCK FLUSH 100 UNIT/ML IV SOLN: 500.0000 [IU] | Freq: Once | INTRAVENOUS | Status: DC

## 2021-09-15 MED ORDER — FLUDEOXYGLUCOSE F - 18 (FDG) INJECTION
10.0000 | Freq: Once | INTRAVENOUS | Status: AC | PRN
  Administered 2021-09-15: 9.7 via INTRAVENOUS

## 2021-09-15 MED ORDER — SODIUM CHLORIDE (PF) 0.9 % IJ SOLN
INTRAMUSCULAR | Status: AC
  Filled 2021-09-15: qty 50

## 2021-09-15 MED ORDER — IOHEXOL 350 MG/ML SOLN
75.0000 mL | Freq: Once | INTRAVENOUS | Status: AC | PRN
  Administered 2021-09-15: 75 mL via INTRAVENOUS

## 2021-09-15 MED ORDER — HEPARIN SOD (PORK) LOCK FLUSH 100 UNIT/ML IV SOLN
INTRAVENOUS | Status: AC
  Administered 2021-09-15: 500 [IU]
  Filled 2021-09-15: qty 5

## 2021-09-16 ENCOUNTER — Other Ambulatory Visit: Payer: Self-pay | Admitting: *Deleted

## 2021-09-16 MED ORDER — LEVETIRACETAM 1000 MG PO TABS: 1000.0000 mg | ORAL_TABLET | Freq: Two times a day (BID) | ORAL | 3 refills | Status: DC

## 2021-09-16 NOTE — Progress Notes (Signed)
Patient called to report that she will need a new Rx for increase in Keppra per Dr Renda Rolls last office note.  Routed new prescription to pharmacy.

## 2021-09-17 ENCOUNTER — Inpatient Hospital Stay: Payer: Medicare Other

## 2021-09-17 ENCOUNTER — Inpatient Hospital Stay: Payer: Medicare Other | Attending: Hematology and Oncology | Admitting: Adult Health

## 2021-09-17 NOTE — Progress Notes (Deleted)
[place] Cancer Follow up:    [name] Fraise, MD [place] [address]   DIAGNOSIS: Cancer Staging  Breast cancer of upper-outer quadrant of left female breast [facility]) Staging form: Breast, AJCC 7th Edition - Clinical: Stage IIB (T2, N1, cM0) - Unsigned Specimen type: Excision Histopathologic type: Infiltrating duct carcinoma, NOS Laterality: Left Tumor size (mm): 43 - Pathologic: No stage assigned - Unsigned Specimen type: Excision Histopathologic type: Infiltrating duct carcinoma, NOS Laterality: Left Tumor size (mm): 43 - Pathologic stage from 06/22/2017: Stage IV ([place], NX, M1) - Signed by Nicholas Lose, MD on 12/27/2019 Estrogen receptor status: Positive Progesterone receptor status: Positive HER2 status: Positive   SUMMARY OF ONCOLOGIC HISTORY: Oncology History  Breast cancer of upper-outer quadrant of left female breast ([place])  11/14/2011 Surgery   Bilateral mastectomy, prophylactic on the right, left breast IDC 3/18 lymph nodes positive with extracapsular extension ER 89%, PR 81%, HER-2 negative, Ki-67 79% T2 N1 A. stage IIB   12/13/2011 - 06/28/2012 Chemotherapy   4 cycles of FEC followed by 4 cycles of Taxotere   07/17/2012 - 08/22/2012 Radiation Therapy   Adjuvant radiation therapy   08/22/2012 - 03/16/2016 Anti-estrogen oral therapy   Arimidex 1 mg daily   03/16/2016 Relapse/Recurrence   Subcutaneous nodule excision left chest: Infiltrating carcinoma breast primary, ER positive, PR negative   03/29/2016 Imaging   CT CAP and bone scan: Lytic lesions T8 vertebral, T1 posterior element, subcutaneous nodule left lateral chest wall, nonspecific lung nodules; Bone scan: Mets to kull, left humerus, left eighth rib, T7/T8, sternum, left acetabulum   04/28/2016 - 06/17/2017 Chemotherapy   Herceptin, lapatinib, Faslodex, Zometa every 4 weeks, lapatinib discontinued in September 2018 due to elevation of LFTs   06/22/2017 Relapse/Recurrence   Surgical  excision:Soft tissue mass left lateral chest wall primary breast cancer, soft tissue mass left medial chest wall breast cancer, tumor is within the dermis extending to the subcutaneous adipose tissue and involves portions of skeletal muscle   06/22/2017 Cancer Staging   Staging form: Breast, AJCC 7th Edition - Pathologic stage from 06/22/2017: Stage IV ([place], NX, M1) - Signed by Nicholas Lose, MD on 12/27/2019    08/2017 - 02/16/2018 Chemotherapy   Faslodex with Herceptin and Perjeta along with Zometa every 4 weeks   03/02/2018 - 05/25/2018 Chemotherapy   Kad[place]la   05/11/2018 Imaging   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 Associated vasogenic edema within the underlying right cerebral hemisphere without significant midline shift. Signal abnormality throughout the visualized bone marrow, compatible with osseous metastatic disease.    06/04/2018 Imaging   CT CAP: Right lower lobe lung nodule 7 mm (was 5 mm); multiple bone metastases throughout the spine and ribs sternum scapula and humerus, slightly increased lower thoracic mets, right renal lesion 2.5 cm (was 1.2 cm) right femur met increased from 2.1 cm to 2.9 cm   06/15/2018 -  Anti-estrogen oral therapy   Abemaciclib, Herceptin, letrozole, Xgeva   06/07/2019 Imaging   Progression of brain metastases,2 discrete dural-based lesions involving the posterior right frontal lobe. The more anterior lesion now measures 16.5 x 17.5 x 14 mm. The posterior lesion now measures 9.5 x 13 x 20 mm.  Vasogenic edema of the frontal and parietal lobes   06/21/2019 - 06/27/2019 Radiation Therapy   SRS to the brain   07/19/2020 Relapse/Recurrence   Left mastectomy scar nodule: Biopsy invasive carcinoma ER 60%, PR 0%, HER-2 negative by [facility]   07/29/2020 Surgery  Soft tissue mass left chest wall excision: No evidence of malignancy.   11/27/2020 - 02/05/2021 Chemotherapy      Patient is on Antibody Plan: BREAST ADO-TRASTUZUMAB EMTANSINE  ([place]) Q21D     01/15/2021 -  Chemotherapy   Patient is on Treatment Plan : BREAST ADO-Trastuzumab Emtansine ([place]) q21d     Metastatic breast cancer ([place])  06/29/2017 Initial Diagnosis   Metastatic breast cancer ([place])   06/15/2018 - 12/25/2020 Chemotherapy    Patient is on Treatment Plan: BRAIN GBM BEVACIZUMAB 14D X 6 CYCLES       01/15/2021 -  Chemotherapy   Patient is on Treatment Plan : BREAST ADO-Trastuzumab Emtansine ([place]) q21d     Brain metastasis ([place])  07/13/2018 Initial Diagnosis   Brain metastasis ([place])   11/27/2020 - 02/05/2021 Chemotherapy      Patient is on Antibody Plan: BREAST ADO-TRASTUZUMAB EMTANSINE ([place]) Q21D       CURRENT THERAPY:  INTERVAL HISTORY: [name] Riley 70 y.o. 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   Brain metastasis ([place]) 07/13/2018   Goals of care, counseling/discussion 06/05/2018   UTI (urinary tract infection) 05/31/2018   Sepsis secondary to UTI ([employer]) 05/31/2018   Thrombocytopenia ([place]) 05/31/2018   Hypokalemia 05/31/2018   Port-A-Cath in place 03/23/2018   Elevated LFTs    Metastatic breast cancer ([place]) 06/29/2017   Nodule of skin of breast 03/06/2017   Abscess, abdomen 07/06/2016   Metastasis to bone ([place]) 06/01/2016   Pneumothorax on left 04/25/2016   Chest pain 10/23/2014   Radiation-induced dermatitis 07/27/2012   Hypertension    Breast cancer of upper-outer quadrant of left female breast ([place]) 09/01/2011   Hypothyroidism 06/12/2009   Mixed hyperlipidemia 06/12/2009   Essential hypertension 06/12/2009   Coronary atherosclerosis 06/12/2009   DIZZINESS 06/12/2009   PALPITATIONS 06/12/2009    is allergic to cantaloupe extract allergy skin test, contrast media [iodinated diagnostic agents], pravastatin, zosyn [piperacillin sod-tazobactam so], and  latex.  MEDICAL HISTORY: Past Medical History:  Diagnosis Date   Allergy    Breast cancer ([place]) 08/25/2011   L , invasive ductal carcinoma, ER/PR +,HER2 -   Cancer ([place])    left breast cancer   Coronary artery disease 2001   Heart attack [facility]) 09/2000   Sep 25, 2000  --no intervention   History of chemotherapy comp. 08/22/2012   4 cycles of FEC and $ cycles of Taxotere   Hyperlipidemia    Hypertension    Hypothyroidism    PONV (postoperative nausea and vomiting)    gets sick from anesthesia   Status post radiation therapy 07/09/12 - 08/22/2012   Left Breast, 60.4 gray    SURGICAL HISTORY: Past Surgical History:  Procedure Laterality Date   ABDOMINAL HYSTERECTOMY  1998   TAH, oophorectomy   APPENDECTOMY  1970   BREAST CYST EXCISION Left 07/29/2020   Procedure: WIDE EXCISION OF LEFT MASTECTOMY INCISION;  Surgeon: Donnie Mesa, MD;  Location: [facility];  Service: General;  Laterality: Left;   [place]   removal of benign lump in rt breast   BREAST SURGERY  11/14/11   right simple mastectomy, left mrm   INCISION AND DRAINAGE OF WOUND Left 07/01/2017   Procedure: IRRIGATION AND DEBRIDEMENT CHEST WALL ABSCESS;  Surgeon: Donnie Mesa, MD;  Location: [facility];  Service: General;  Laterality: Left;   MASS EXCISION Left 06/22/2017   Procedure: EXCISION OF CHEST WALL MASSES;  Surgeon: Donnie Mesa, MD;  Location: [facility];  Service: General;  Laterality: Left;   MASTECTOMY Bilateral    for left breast cancer   OVARIAN CYST SURGERY Right 1970   PORT-A-CATH REMOVAL  08/30/2012   Procedure: REMOVAL PORT-A-CATH;  Surgeon: Imogene Burn. [name], MD;  Location: [facility];  Service: General;  Laterality: Right;  port removal   PORTACATH PLACEMENT  11/14/2011   Procedure: INSERTION PORT-A-CATH;  Surgeon: Imogene Burn. [name], MD;  Location: [facility];  Service: General;  Laterality: Right;   PORTACATH PLACEMENT N/A 04/25/2016   Procedure: INSERTION PORT-A-CATH LEFT  CHEST;  Surgeon: Donnie Mesa, MD;  Location: [facility];  Service: General;  Laterality: N/A;   skin tags  05/09/1997   left axillary left neck skin tags   TONSILLECTOMY  1968    SOCIAL HISTORY: Social History   Socioeconomic History   Marital status: Married    Spouse name: Not on file   Number of children: 3   Years of education: Not on file   Highest education level: High school graduate  Occupational History   Occupation: Retired  Tobacco Use   Smoking status: Never   Smokeless tobacco: Never  Vaping Use   Vaping Use: Never used  Substance and Sexual Activity   Alcohol use: No   Drug use: No   Sexual activity: Yes    Birth control/protection: Surgical    Comment: menarche 37, Parity age 74, G74, P3, 1 miscarriage,  HRT x 5-10 yrs, Mild Hot Flashes  Other Topics Concern   Not on file  Social History Narrative   Lives at home.   Social Determinants of Health   Financial Resource Strain: Not on file  Food Insecurity: Not on file  Transportation Needs: Not on file  Physical Activity: Not on file  Stress: Not on file  Social Connections: Not on file  Intimate Partner Violence: Not on file    FAMILY HISTORY: Family History  Problem Relation Age of Onset   Hypertension Maternal Grandmother    Diabetes Maternal Grandmother    Cancer Father 84       lung cancer and Prostate Cancer   Hypertension Mother    Cancer Paternal Aunt        ovarian   Cancer Cousin        breast, paternal cousin   Cancer Paternal Uncle        stomach   Cancer Paternal Grandfather        Esophagus   Colon cancer Neg Hx     Review of Systems - Oncology    PHYSICAL EXAMINATION  ECOG PERFORMANCE STATUS: {CHL ONC ECOG PS:[id number]}  There were no vitals filed for this visit.  Physical Exam  LABORATORY DATA:  CBC    Component Value Date/Time   WBC 2.5 (L) 08/28/2021 0643   RBC 2.80 (L) 08/28/2021 0643   HGB 10.8 (L) 08/28/2021 0643   HGB 10.4 (L)  08/27/2021 1020   HGB 11.8 08/12/2021 0918   HGB 11.5 (L) 09/22/2017 0958   HCT 31.2 (L) 08/28/2021 0643   HCT 33.5 (L) 08/12/2021 0918   HCT 34.9 09/22/2017 0958   PLT 87 (L) 08/28/2021 0643   PLT 96 (L) 08/27/2021 1020   PLT 122 (L) 08/12/2021 0918   MCV 111.4 (H) 08/28/2021 0643   MCV 104 (H) 08/12/2021 0865  MCV 94.3 09/22/2017 0958   MCH 38.6 (H) 08/28/2021 0643   MCHC 34.6 08/28/2021 0643   RDW 14.4 08/28/2021 0643   RDW 13.5 08/12/2021 0918   RDW 15.3 (H) 09/22/2017 0958   LYMPHSABS 0.9 08/28/2021 0643   LYMPHSABS 1.5 08/12/2021 0918   LYMPHSABS 2.2 09/22/2017 0958   MONOABS 0.3 08/28/2021 0643   MONOABS 0.8 09/22/2017 0958   EOSABS 0.0 08/28/2021 0643   EOSABS 0.1 08/12/2021 0918   BASOSABS 0.0 08/28/2021 0643   BASOSABS 0.1 08/12/2021 0918   BASOSABS 0.0 09/22/2017 0958    CMP     Component Value Date/Time   NA 137 08/28/2021 0643   NA 140 08/12/2021 0918   NA 140 09/22/2017 0958   K 3.2 (L) 08/28/2021 0643   K 3.7 09/22/2017 0958   CL 102 08/28/2021 0643   CL 105 08/10/2012 0908   CO2 27 08/28/2021 0643   CO2 26 09/22/2017 0958   GLUCOSE 98 08/28/2021 0643   GLUCOSE 80 09/22/2017 0958   GLUCOSE 81 08/10/2012 0908   BUN 17 08/28/2021 0643   BUN 12 08/12/2021 0918   BUN 12.8 09/22/2017 0958   CREATININE 0.69 08/28/2021 0643   CREATININE 0.72 08/27/2021 1020   CREATININE 0.6 09/22/2017 0958   CALCIUM 9.0 08/28/2021 0643   CALCIUM 9.1 09/22/2017 0958   PROT 6.3 (L) 08/28/2021 0643   PROT 6.5 08/12/2021 0918   PROT 7.0 09/22/2017 0958   ALBUMIN 3.2 (L) 08/28/2021 0643   ALBUMIN 3.8 08/12/2021 0918   ALBUMIN 4.0 09/22/2017 0958   AST 73 (H) 08/28/2021 0643   AST 67 (H) 08/27/2021 1020   AST 37 (H) 09/22/2017 0958   ALT 29 08/28/2021 0643   ALT 28 08/27/2021 1020   ALT 31 09/22/2017 0958   ALKPHOS 329 (H) 08/28/2021 0643   ALKPHOS 140 09/22/2017 0958   BILITOT 1.1 08/28/2021 0643   BILITOT 1.0 08/27/2021 1020   BILITOT 0.41 09/22/2017 0958    GFRNONAA >60 08/28/2021 0643   GFRNONAA >60 08/27/2021 1020   GFRAA 76 09/02/2020 0806   GFRAA >60 07/10/2020 0820       PENDING LABS:   RADIOGRAPHIC STUDIES:  No results found.   PATHOLOGY:     ASSESSMENT and THERAPY PLAN:   No problem-specific Assessment & Plan notes found for this encounter.   No orders of the defined types were placed in this encounter.   All questions were answered. The patient knows to call the clinic with any problems, questions or concerns. We can certainly see the patient much sooner if necessary. This note was electronically signed. Scot Dock, NP 09/17/2021

## 2021-09-20 ENCOUNTER — Inpatient Hospital Stay: Payer: Medicare Other

## 2021-09-22 ENCOUNTER — Other Ambulatory Visit (HOSPITAL_COMMUNITY): Payer: Self-pay

## 2021-09-22 NOTE — Progress Notes (Signed)
Hi [name] Riley,  Blood glucose testing is standard procedure prior to PET-CT. If blood glucose is too high, we may choose to bring the patient back another day as the exam can be nondiagnostic in that setting.  [name]

## 2021-09-23 ENCOUNTER — Inpatient Hospital Stay (HOSPITAL_COMMUNITY)
Admission: EM | Admit: 2021-09-23 | Discharge: 2021-09-25 | DRG: 100 | Disposition: A | Discharge status: Home Health | Payer: Medicare Other | Attending: Internal Medicine | Admitting: Internal Medicine

## 2021-09-23 ENCOUNTER — Inpatient Hospital Stay: Payer: Medicare Other | Admitting: Internal Medicine

## 2021-09-23 ENCOUNTER — Encounter (HOSPITAL_COMMUNITY): Payer: Self-pay | Admitting: Emergency Medicine

## 2021-09-23 DIAGNOSIS — E039 Hypothyroidism, unspecified: Secondary | ICD-10-CM | POA: Diagnosis present

## 2021-09-23 DIAGNOSIS — Z6820 Body mass index (BMI) 20.0-20.9, adult: Secondary | ICD-10-CM

## 2021-09-23 DIAGNOSIS — Z79890 Hormone replacement therapy (postmenopausal): Secondary | ICD-10-CM

## 2021-09-23 DIAGNOSIS — I10 Essential (primary) hypertension: Secondary | ICD-10-CM | POA: Diagnosis present

## 2021-09-23 DIAGNOSIS — Z833 Family history of diabetes mellitus: Secondary | ICD-10-CM

## 2021-09-23 DIAGNOSIS — Z91040 Latex allergy status: Secondary | ICD-10-CM

## 2021-09-23 DIAGNOSIS — Z170 Estrogen receptor positive status [ER+]: Secondary | ICD-10-CM

## 2021-09-23 DIAGNOSIS — G40909 Epilepsy, unspecified, not intractable, without status epilepticus: Secondary | ICD-10-CM | POA: Diagnosis not present

## 2021-09-23 DIAGNOSIS — C7931 Secondary malignant neoplasm of brain: Secondary | ICD-10-CM | POA: Diagnosis present

## 2021-09-23 DIAGNOSIS — Z91041 Radiographic dye allergy status: Secondary | ICD-10-CM

## 2021-09-23 DIAGNOSIS — E782 Mixed hyperlipidemia: Secondary | ICD-10-CM | POA: Diagnosis present

## 2021-09-23 DIAGNOSIS — Z66 Do not resuscitate: Secondary | ICD-10-CM | POA: Diagnosis present

## 2021-09-23 DIAGNOSIS — C7951 Secondary malignant neoplasm of bone: Secondary | ICD-10-CM | POA: Diagnosis present

## 2021-09-23 DIAGNOSIS — G4700 Insomnia, unspecified: Secondary | ICD-10-CM | POA: Diagnosis present

## 2021-09-23 DIAGNOSIS — Z923 Personal history of irradiation: Secondary | ICD-10-CM

## 2021-09-23 DIAGNOSIS — E440 Moderate protein-calorie malnutrition: Secondary | ICD-10-CM | POA: Diagnosis present

## 2021-09-23 DIAGNOSIS — I252 Old myocardial infarction: Secondary | ICD-10-CM

## 2021-09-23 DIAGNOSIS — Z9221 Personal history of antineoplastic chemotherapy: Secondary | ICD-10-CM

## 2021-09-23 DIAGNOSIS — Z91018 Allergy to other foods: Secondary | ICD-10-CM

## 2021-09-23 DIAGNOSIS — E872 Acidosis, unspecified: Secondary | ICD-10-CM | POA: Diagnosis present

## 2021-09-23 DIAGNOSIS — Z79811 Long term (current) use of aromatase inhibitors: Secondary | ICD-10-CM

## 2021-09-23 DIAGNOSIS — Z888 Allergy status to other drugs, medicaments and biological substances status: Secondary | ICD-10-CM

## 2021-09-23 DIAGNOSIS — I2510 Atherosclerotic heart disease of native coronary artery without angina pectoris: Secondary | ICD-10-CM | POA: Diagnosis present

## 2021-09-23 DIAGNOSIS — E876 Hypokalemia: Secondary | ICD-10-CM | POA: Diagnosis present

## 2021-09-23 DIAGNOSIS — Z20822 Contact with and (suspected) exposure to covid-19: Secondary | ICD-10-CM | POA: Diagnosis present

## 2021-09-23 DIAGNOSIS — Z8249 Family history of ischemic heart disease and other diseases of the circulatory system: Secondary | ICD-10-CM

## 2021-09-23 DIAGNOSIS — R569 Unspecified convulsions: Secondary | ICD-10-CM | POA: Diagnosis not present

## 2021-09-23 DIAGNOSIS — Z801 Family history of malignant neoplasm of trachea, bronchus and lung: Secondary | ICD-10-CM

## 2021-09-23 DIAGNOSIS — G936 Cerebral edema: Secondary | ICD-10-CM

## 2021-09-23 DIAGNOSIS — Z853 Personal history of malignant neoplasm of breast: Secondary | ICD-10-CM

## 2021-09-23 DIAGNOSIS — Z79899 Other long term (current) drug therapy: Secondary | ICD-10-CM

## 2021-09-23 MED ORDER — LORAZEPAM 2 MG/ML IJ SOLN
1.0000 mg | Freq: Once | INTRAMUSCULAR | Status: AC
  Administered 2021-09-23: 1 mg via INTRAVENOUS
  Filled 2021-09-23: qty 1

## 2021-09-23 MED ORDER — SODIUM CHLORIDE 0.9 % IV BOLUS
1000.0000 mL | Freq: Once | INTRAVENOUS | Status: AC
  Administered 2021-09-24: 1000 mL via INTRAVENOUS

## 2021-09-23 NOTE — ED Triage Notes (Addendum)
Pt brought in by [facility] from home for seizure like activity. Pt has hx of brain CA and tumors which cause seizures. Pt arrives with tremors but is alert.  Pt given 2.5mg  versed by EMS.

## 2021-09-24 ENCOUNTER — Emergency Department (HOSPITAL_COMMUNITY): Payer: Medicare Other

## 2021-09-24 ENCOUNTER — Encounter: Payer: Self-pay | Admitting: Hematology and Oncology

## 2021-09-24 ENCOUNTER — Ambulatory Visit: Payer: Medicare Other | Admitting: Adult Health

## 2021-09-24 ENCOUNTER — Other Ambulatory Visit: Payer: Self-pay

## 2021-09-24 ENCOUNTER — Ambulatory Visit: Payer: Medicare Other

## 2021-09-24 ENCOUNTER — Observation Stay (HOSPITAL_COMMUNITY): Payer: Medicare Other

## 2021-09-24 ENCOUNTER — Other Ambulatory Visit (HOSPITAL_COMMUNITY): Payer: Self-pay

## 2021-09-24 ENCOUNTER — Other Ambulatory Visit: Payer: Medicare Other

## 2021-09-24 DIAGNOSIS — Z9221 Personal history of antineoplastic chemotherapy: Secondary | ICD-10-CM | POA: Diagnosis not present

## 2021-09-24 DIAGNOSIS — Z79890 Hormone replacement therapy (postmenopausal): Secondary | ICD-10-CM | POA: Diagnosis not present

## 2021-09-24 DIAGNOSIS — Z6820 Body mass index (BMI) 20.0-20.9, adult: Secondary | ICD-10-CM | POA: Diagnosis not present

## 2021-09-24 DIAGNOSIS — Z923 Personal history of irradiation: Secondary | ICD-10-CM | POA: Diagnosis not present

## 2021-09-24 DIAGNOSIS — I252 Old myocardial infarction: Secondary | ICD-10-CM | POA: Diagnosis not present

## 2021-09-24 DIAGNOSIS — Z91041 Radiographic dye allergy status: Secondary | ICD-10-CM | POA: Diagnosis not present

## 2021-09-24 DIAGNOSIS — G40909 Epilepsy, unspecified, not intractable, without status epilepticus: Secondary | ICD-10-CM | POA: Diagnosis present

## 2021-09-24 DIAGNOSIS — G4700 Insomnia, unspecified: Secondary | ICD-10-CM | POA: Diagnosis present

## 2021-09-24 DIAGNOSIS — R569 Unspecified convulsions: Secondary | ICD-10-CM | POA: Diagnosis present

## 2021-09-24 DIAGNOSIS — C7931 Secondary malignant neoplasm of brain: Secondary | ICD-10-CM | POA: Diagnosis present

## 2021-09-24 DIAGNOSIS — Z66 Do not resuscitate: Secondary | ICD-10-CM | POA: Diagnosis present

## 2021-09-24 DIAGNOSIS — Z79899 Other long term (current) drug therapy: Secondary | ICD-10-CM | POA: Diagnosis not present

## 2021-09-24 DIAGNOSIS — Z20822 Contact with and (suspected) exposure to covid-19: Secondary | ICD-10-CM | POA: Diagnosis present

## 2021-09-24 DIAGNOSIS — E039 Hypothyroidism, unspecified: Secondary | ICD-10-CM | POA: Diagnosis present

## 2021-09-24 DIAGNOSIS — E872 Acidosis, unspecified: Secondary | ICD-10-CM | POA: Diagnosis present

## 2021-09-24 DIAGNOSIS — Z853 Personal history of malignant neoplasm of breast: Secondary | ICD-10-CM | POA: Diagnosis not present

## 2021-09-24 DIAGNOSIS — E782 Mixed hyperlipidemia: Secondary | ICD-10-CM | POA: Diagnosis present

## 2021-09-24 DIAGNOSIS — G936 Cerebral edema: Secondary | ICD-10-CM | POA: Diagnosis present

## 2021-09-24 DIAGNOSIS — I10 Essential (primary) hypertension: Secondary | ICD-10-CM | POA: Diagnosis present

## 2021-09-24 DIAGNOSIS — Z170 Estrogen receptor positive status [ER+]: Secondary | ICD-10-CM | POA: Diagnosis not present

## 2021-09-24 DIAGNOSIS — I2510 Atherosclerotic heart disease of native coronary artery without angina pectoris: Secondary | ICD-10-CM | POA: Diagnosis present

## 2021-09-24 DIAGNOSIS — C7951 Secondary malignant neoplasm of bone: Secondary | ICD-10-CM | POA: Diagnosis present

## 2021-09-24 DIAGNOSIS — Z79811 Long term (current) use of aromatase inhibitors: Secondary | ICD-10-CM | POA: Diagnosis not present

## 2021-09-24 DIAGNOSIS — E876 Hypokalemia: Secondary | ICD-10-CM | POA: Diagnosis present

## 2021-09-24 DIAGNOSIS — E440 Moderate protein-calorie malnutrition: Secondary | ICD-10-CM | POA: Diagnosis present

## 2021-09-24 LAB — COMPREHENSIVE METABOLIC PANEL
ALT: 55 U/L — ABNORMAL HIGH (ref 0–44)
ALT: 53 U/L — ABNORMAL HIGH (ref 0–44)
AST: 75 U/L — ABNORMAL HIGH (ref 15–41)
AST: 73 U/L — ABNORMAL HIGH (ref 15–41)
Albumin: 2.7 g/dL — ABNORMAL LOW (ref 3.5–5.0)
Albumin: 2.6 g/dL — ABNORMAL LOW (ref 3.5–5.0)
Alkaline Phosphatase: 362 U/L — ABNORMAL HIGH (ref 38–126)
Alkaline Phosphatase: 348 U/L — ABNORMAL HIGH (ref 38–126)
Anion gap: 7 (ref 5–15)
Anion gap: 6 (ref 5–15)
BUN: 20 mg/dL (ref 8–23)
BUN: 18 mg/dL (ref 8–23)
CO2: 27 mmol/L (ref 22–32)
CO2: 27 mmol/L (ref 22–32)
Calcium: 8.3 mg/dL — ABNORMAL LOW (ref 8.9–10.3)
Calcium: 8.1 mg/dL — ABNORMAL LOW (ref 8.9–10.3)
Chloride: 105 mmol/L (ref 98–111)
Chloride: 104 mmol/L (ref 98–111)
Creatinine, Ser: 0.64 mg/dL (ref 0.44–1.00)
Creatinine, Ser: 0.56 mg/dL (ref 0.44–1.00)
GFR, Estimated: >60 mL/min (ref >60)
GFR, Estimated: >60 mL/min (ref >60)
Glucose, Bld: 86 mg/dL (ref 70–99)
Glucose, Bld: 88 mg/dL (ref 70–99)
Potassium: 3.3 mmol/L — ABNORMAL LOW (ref 3.5–5.1)
Potassium: 3.2 mmol/L — ABNORMAL LOW (ref 3.5–5.1)
Sodium: 139 mmol/L (ref 135–145)
Sodium: 137 mmol/L (ref 135–145)
Total Bilirubin: 0.9 mg/dL (ref 0.3–1.2)
Total Bilirubin: 0.9 mg/dL (ref 0.3–1.2)
Total Protein: 5.3 g/dL — ABNORMAL LOW (ref 6.5–8.1)
Total Protein: 5.1 g/dL — ABNORMAL LOW (ref 6.5–8.1)

## 2021-09-24 LAB — CBC WITH DIFFERENTIAL/PLATELET
Abs Immature Granulocytes: 0.03 10*3/uL (ref 0.00–0.07)
Basophils Absolute: 0 10*3/uL (ref 0.0–0.1)
Basophils Relative: 1 %
Eosinophils Absolute: 0 10*3/uL (ref 0.0–0.5)
Eosinophils Relative: 1 %
HCT: 29 % — ABNORMAL LOW (ref 36.0–46.0)
Hemoglobin: 9.9 g/dL — ABNORMAL LOW (ref 12.0–15.0)
Immature Granulocytes: 1 %
Lymphocytes Relative: 29 %
Lymphs Abs: 0.9 10*3/uL (ref 0.7–4.0)
MCH: 38.4 pg — ABNORMAL HIGH (ref 26.0–34.0)
MCHC: 34.1 g/dL (ref 30.0–36.0)
MCV: 112.4 fL — ABNORMAL HIGH (ref 80.0–100.0)
Monocytes Absolute: 0.4 10*3/uL (ref 0.1–1.0)
Monocytes Relative: 14 %
Neutro Abs: 1.7 10*3/uL (ref 1.7–7.7)
Neutrophils Relative %: 54 %
Platelets: 52 10*3/uL — ABNORMAL LOW (ref 150–400)
RBC: 2.58 MIL/uL — ABNORMAL LOW (ref 3.87–5.11)
RDW: 15.1 % (ref 11.5–15.5)
WBC: 3.1 10*3/uL — ABNORMAL LOW (ref 4.0–10.5)
nRBC: 0 % (ref 0.0–0.2)

## 2021-09-24 LAB — LACTIC ACID, PLASMA
Lactic Acid, Venous: 3.1 mmol/L (ref 0.5–1.9)
Lactic Acid, Venous: 1 mmol/L (ref 0.5–1.9)

## 2021-09-24 LAB — CBC
HCT: 30.4 % — ABNORMAL LOW (ref 36.0–46.0)
Hemoglobin: 9.8 g/dL — ABNORMAL LOW (ref 12.0–15.0)
MCH: 36.8 pg — ABNORMAL HIGH (ref 26.0–34.0)
MCHC: 32.2 g/dL (ref 30.0–36.0)
MCV: 114.3 fL — ABNORMAL HIGH (ref 80.0–100.0)
Platelets: 61 10*3/uL — ABNORMAL LOW (ref 150–400)
RBC: 2.66 MIL/uL — ABNORMAL LOW (ref 3.87–5.11)
RDW: 14.9 % (ref 11.5–15.5)
WBC: 2.2 10*3/uL — ABNORMAL LOW (ref 4.0–10.5)
nRBC: 0 % (ref 0.0–0.2)

## 2021-09-24 LAB — PROTIME-INR
INR: 0.9 (ref 0.8–1.2)
Prothrombin Time: 12.6 seconds (ref 11.4–15.2)

## 2021-09-24 LAB — HIV ANTIBODY (ROUTINE TESTING W REFLEX): HIV Screen 4th Generation wRfx: NONREACTIVE

## 2021-09-24 LAB — TROPONIN I (HIGH SENSITIVITY)
Troponin I (High Sensitivity): 51 ng/L — ABNORMAL HIGH (ref <18)
Troponin I (High Sensitivity): 104 ng/L (ref <18)
Troponin I (High Sensitivity): 49 ng/L — ABNORMAL HIGH (ref <18)

## 2021-09-24 LAB — RESP PANEL BY RT-PCR (FLU A&B, COVID) ARPGX2
Influenza A by PCR: NEGATIVE
Influenza B by PCR: NEGATIVE
SARS Coronavirus 2 by RT PCR: NEGATIVE

## 2021-09-24 LAB — TSH: TSH: 3.625 u[IU]/mL (ref 0.350–4.500)

## 2021-09-24 LAB — CK: Total CK: 44 U/L (ref 38–234)

## 2021-09-24 MED ORDER — HEPARIN SODIUM (PORCINE) 5000 UNIT/ML IJ SOLN
5000.0000 [IU] | Freq: Three times a day (TID) | INTRAMUSCULAR | Status: DC
  Administered 2021-09-24 – 2021-09-25 (×4): 5000 [IU] via SUBCUTANEOUS
  Filled 2021-09-24 (×4): qty 1

## 2021-09-24 MED ORDER — ONDANSETRON HCL 4 MG PO TABS: 4.0000 mg | ORAL_TABLET | Freq: Four times a day (QID) | ORAL | Status: DC | PRN

## 2021-09-24 MED ORDER — LEVETIRACETAM 500 MG PO TABS
750.0000 mg | ORAL_TABLET | Freq: Three times a day (TID) | ORAL | Status: DC
  Administered 2021-09-24: 750 mg via ORAL
  Filled 2021-09-24: qty 1

## 2021-09-24 MED ORDER — IRBESARTAN 150 MG PO TABS
75.0000 mg | ORAL_TABLET | Freq: Every day | ORAL | Status: DC
  Administered 2021-09-25: 75 mg via ORAL
  Filled 2021-09-24: qty 1

## 2021-09-24 MED ORDER — LEVETIRACETAM 500 MG PO TABS
1000.0000 mg | ORAL_TABLET | Freq: Two times a day (BID) | ORAL | Status: DC
  Administered 2021-09-24: 1000 mg via ORAL
  Filled 2021-09-24: qty 2

## 2021-09-24 MED ORDER — CHLORHEXIDINE GLUCONATE CLOTH 2 % EX PADS
6.0000 | MEDICATED_PAD | Freq: Every day | CUTANEOUS | Status: DC
  Administered 2021-09-24 – 2021-09-25 (×2): 6 via TOPICAL

## 2021-09-24 MED ORDER — ROSUVASTATIN CALCIUM 10 MG PO TABS
5.0000 mg | ORAL_TABLET | Freq: Every day | ORAL | Status: DC
  Administered 2021-09-25: 5 mg via ORAL
  Filled 2021-09-24: qty 1

## 2021-09-24 MED ORDER — POTASSIUM CHLORIDE 20 MEQ PO PACK
40.0000 meq | PACK | Freq: Every day | ORAL | Status: DC
  Administered 2021-09-24 – 2021-09-25 (×2): 40 meq via ORAL
  Filled 2021-09-24 (×2): qty 2

## 2021-09-24 MED ORDER — METOPROLOL SUCCINATE ER 50 MG PO TB24
100.0000 mg | ORAL_TABLET | Freq: Every day | ORAL | Status: DC
  Administered 2021-09-24 – 2021-09-25 (×2): 100 mg via ORAL
  Filled 2021-09-24 (×2): qty 2

## 2021-09-24 MED ORDER — LEVETIRACETAM 500 MG PO TABS
1500.0000 mg | ORAL_TABLET | Freq: Two times a day (BID) | ORAL | Status: DC
  Administered 2021-09-24 – 2021-09-25 (×2): 1500 mg via ORAL
  Filled 2021-09-24 (×2): qty 3

## 2021-09-24 MED ORDER — MORPHINE SULFATE (PF) 2 MG/ML IV SOLN: 2.0000 mg | INTRAVENOUS | Status: DC | PRN

## 2021-09-24 MED ORDER — ONDANSETRON HCL 4 MG/2ML IJ SOLN: 4.0000 mg | Freq: Four times a day (QID) | INTRAMUSCULAR | Status: DC | PRN

## 2021-09-24 MED ORDER — DEXAMETHASONE SODIUM PHOSPHATE 4 MG/ML IJ SOLN
4.0000 mg | Freq: Four times a day (QID) | INTRAMUSCULAR | Status: DC
  Administered 2021-09-24 – 2021-09-25 (×4): 4 mg via INTRAVENOUS
  Filled 2021-09-24 (×5): qty 1

## 2021-09-24 MED ORDER — OXYCODONE HCL 5 MG PO TABS: 5.0000 mg | ORAL_TABLET | ORAL | Status: DC | PRN

## 2021-09-24 MED ORDER — METHOCARBAMOL 1000 MG/10ML IJ SOLN
500.0000 mg | Freq: Four times a day (QID) | INTRAVENOUS | Status: DC | PRN
  Filled 2021-09-24: qty 5

## 2021-09-24 MED ORDER — ACETAMINOPHEN 325 MG PO TABS: 650.0000 mg | ORAL_TABLET | Freq: Four times a day (QID) | ORAL | Status: DC | PRN

## 2021-09-24 MED ORDER — BOOST / RESOURCE BREEZE PO LIQD CUSTOM
1.0000 | Freq: Three times a day (TID) | ORAL | Status: DC
  Administered 2021-09-24 – 2021-09-25 (×3): 1 via ORAL
  Filled 2021-09-24 (×11): qty 1

## 2021-09-24 MED ORDER — ACETAMINOPHEN 650 MG RE SUPP: 650.0000 mg | Freq: Four times a day (QID) | RECTAL | Status: DC | PRN

## 2021-09-24 MED ORDER — LEVOTHYROXINE SODIUM 100 MCG PO TABS
100.0000 ug | ORAL_TABLET | Freq: Every day | ORAL | Status: DC
  Administered 2021-09-24 – 2021-09-25 (×2): 100 ug via ORAL
  Filled 2021-09-24: qty 1
  Filled 2021-09-24: qty 2

## 2021-09-24 MED ORDER — POTASSIUM CHLORIDE 20 MEQ PO PACK
40.0000 meq | PACK | Freq: Once | ORAL | Status: AC
  Administered 2021-09-24: 40 meq via ORAL
  Filled 2021-09-24: qty 2

## 2021-09-24 NOTE — H&P (Signed)
TRH H&P    Patient Demographics:    [name] Riley, is a 70 y.o. female  MRN: [id number]  DOB - 08/05/1951  Admit Date - 09/23/2021  Referring MD/NP/PA: Sedonia Small  Outpatient Primary MD for the patient is [name], MD  Patient coming from: Home  Chief complaint- Seizure   HPI:    [name] Riley  is a 70 y.o. female, with history of metastatic breast cancer, coronary artery disease, heart attack, hyperlipidemia, hypothyroidism, history of chemo and radiation, currently on chemo, presents to the ED with a chief complaint of seizures.  Patient reports she remembers the entire thing.  She had shaking and pain in her neck.  She reports that the shaking started in her left upper extremity and then proceeded to full body.  She had no loss of consciousness.  This is similar to the seizure she had 3-4 weeks ago.  She reports preceding symptoms including a headache that starts on the top right of her head and radiates around to her left cheekbone.  She also has stomach sensation that feels like waves.  Patient reports that the symptoms only happen prior to his seizure.  Patient remembers the ride to the hospital.  She remembers feeling like she was going to fall in the bathroom floor at home.  She did not fall.  Patient ambulates on her own without a walker or wheelchair.  Patient reports her last chemo was November 11, and her next is December 12.  She has felt fatigued more than usual per her report.  She also reports 2 days of decreased hearing in the left ear.  It sounds like low volume, but does not sound muffled.  Patient reports swelling in her feet and legs.  She reports she takes Lasix every day and has been compliant with it.  Patient reports she is also been compliant with her Keppra.  Her Keppra was recently increased from 500 mg twice daily to 2000 mg twice daily approximately 2 weeks ago.  Patient has no other complaints  at this time.  It is noted the patient was given Versed in EMS on the way here 2.5 mg.  Patient does not smoke, does not drink, does not use illicit drugs.  Patient is vaccinated for COVID.  Patient is DNR.  In the ED Temp 98, heart rate 89-130, respiratory rate 15-24, blood pressure 121/66, satting 89% Leukopenia with a white blood cell count of 2.2, hemoglobin 9.8 Chemistry panel reveals a hypokalemia at 3.3, alk phos 362, albumin 2.7, AST 75, ALT 55, troponin initially 51, then 104 and lactic acidosis 3.1 Chest x-ray shows diffuse sclerotic metastatic disease.  No intrathoracic abnormality noted.  CT head shows stable vasogenic edema throughout the right frontal and parietal lobe consistent with the known history of metastatic disease.  Changes better visualized on recent MRI Admission requested for seizures    Review of systems:   In addition to the HPI above,  No Fever-chills,  No changes with Vision or hearing, No problems swallowing food or Liquids, No Chest pain, Cough  or Shortness of Breath, No Nausea or Vomiting, bowel movements are regular, No Blood in stool or Urine, No dysuria, No new skin rashes or bruises, No new joints pains-aches,  No new weakness, tingling, numbness in any extremity, No recent weight gain or loss, No polyuria, polydypsia or polyphagia, No significant Mental Stressors.  All other systems reviewed and are negative.    Past History of the following :    Past Medical History:  Diagnosis Date   Allergy    Breast cancer ([place]) 08/25/2011   L , invasive ductal carcinoma, ER/PR +,HER2 -   Cancer ([place])    left breast cancer   Coronary artery disease 2001   Heart attack [facility]) 09/2000   Sep 25, 2000  --no intervention   History of chemotherapy comp. 08/22/2012   4 cycles of FEC and $ cycles of Taxotere   Hyperlipidemia    Hypertension    Hypothyroidism    PONV (postoperative nausea and vomiting)    gets sick from anesthesia   Status post  radiation therapy 07/09/12 - 08/22/2012   Left Breast, 60.4 gray      Past Surgical History:  Procedure Laterality Date   ABDOMINAL HYSTERECTOMY  1998   TAH, oophorectomy   APPENDECTOMY  1970   BREAST CYST EXCISION Left 07/29/2020   Procedure: WIDE EXCISION OF LEFT MASTECTOMY INCISION;  Surgeon: Donnie Mesa, MD;  Location: [place];  Service: General;  Laterality: Left;   Painter   removal of benign lump in rt breast   BREAST SURGERY  11/14/11   right simple mastectomy, left mrm   INCISION AND DRAINAGE OF WOUND Left 07/01/2017   Procedure: IRRIGATION AND DEBRIDEMENT CHEST WALL ABSCESS;  Surgeon: Donnie Mesa, MD;  Location[facility];  Service: General;  Laterality: Left;   MASS EXCISION Left 06/22/2017   Procedure: EXCISION OF CHEST WALL MASSES;  Surgeon: Donnie Mesa, MD;  Location: [place];  Service: General;  Laterality: Left;   MASTECTOMY Bilateral    for left breast cancer   OVARIAN CYST SURGERY Right [place]  08/30/2012   Procedure: REMOVAL PORT-A-CATH;  Surgeon: Imogene Burn. [name], MD;  Location: [place];  Service: General;  Laterality: Right;  port removal   PORTACATH PLACEMENT  11/14/2011   Procedure: INSERTION PORT-A-CATH;  Surgeon: Imogene Burn. [name], MD;  Location: [place];  Service: General;  Laterality: Right;   PORTACATH PLACEMENT N/A 04/25/2016   Procedure: INSERTION PORT-A-CATH LEFT CHEST;  Surgeon: Donnie Mesa, MD;  Location: [place];  Service: General;  Laterality: N/A;   skin tags  05/09/1997   left axillary left neck skin tags   TONSILLECTOMY  1968      Social History:      Social History   Tobacco Use   Smoking status: Never   Smokeless tobacco: Never  Substance Use Topics   Alcohol use: No       Family History :     Family History  Problem Relation Age of Onset   Hypertension Maternal Grandmother    Diabetes Maternal Grandmother    Cancer Father 39       lung  cancer and Prostate Cancer   Hypertension Mother    Cancer Paternal Aunt        ovarian   Cancer Cousin        breast, paternal cousin   Cancer Paternal Uncle        stomach  Cancer Paternal Grandfather        Esophagus   Colon cancer Neg Hx       Home Medications:   Prior to Admission medications   Medication Sig Start Date End Date Taking? Authorizing Provider  abemaciclib (VERZENIO) 50 MG tablet Take 1 tablet (50 mg total) by mouth 2 (two) times daily. 07/02/21 07/02/22  Nicholas Lose, MD  acetaminophen (TYLENOL) 500 MG tablet Take 1,000 mg by mouth every 6 (six) hours as needed for moderate pain.    [provider]  Ascorbic Acid (VITAMIN C PO) Take by mouth.    [provider]  cholecalciferol (VITAMIN D) 1000 units tablet Take 1 tablet (1,000 Units total) by mouth daily. 04/13/18   Nicholas Lose, MD  dexamethasone (DECADRON) 2 MG tablet Take 1 tablet (2 mg total) by mouth 2 (two) times daily with a meal. 09/06/21   Vaslow, Acey Lav, MD  Docusate Sodium (DSS) 100 MG CAPS Take by mouth.    [provider]  furosemide (LASIX) 20 MG tablet TAKE ONE (1) TABLET BY MOUTH EVERY DAY 02/08/21   Nicholas Lose, MD  letrozole [facility]) 2.5 MG tablet TAKE ONE (1) TABLET EACH DAY 04/26/21   Magrinat, Virgie Dad, MD  levETIRAcetam (KEPPRA) 1000 MG tablet Take 1 tablet (1,000 mg total) by mouth 2 (two) times daily. 09/16/21   Ventura Sellers, MD  levothyroxine (SYNTHROID) 100 MCG tablet TAKE ONE TABLET EACH MORNING BEFORE BREAKFAST 09/13/21   [name], MD  lidocaine-prilocaine (EMLA) cream APPLY TOPICALLY AS NEEDED FOR PORT ACCESS 01/15/21   Harle Stanford., PA-C  metoprolol succinate (TOPROL-XL) 100 MG 24 hr tablet Take with or immediately following a meal. 08/12/21   [name], MD  Multiple Vitamins-Minerals (MULTIVITAMIN WITH MINERALS) tablet Take 1 tablet by mouth daily.    [provider]  ondansetron (ZOFRAN-ODT) 4 MG disintegrating tablet TAKE 1  TABLET EVERY 6 HOURS AS NEEDED FOR NAUSEA AND VOMITING 05/07/21   Nicholas Lose, MD  potassium chloride SA (KLOR-CON) 20 MEQ tablet Take 1 tablet (20 mEq total) by mouth 2 (two) times daily for 7 days. 03/29/21 04/05/21  Dorie Rank, MD  predniSONE (DELTASONE) 50 MG tablet Patient to take 1 tablet 13 hours prior to scan, 1 tablet 7 hours prior and 1 tablet 1 hour prior to scan. 09/07/21   Nicholas Lose, MD  rosuvastatin (CRESTOR) 5 MG tablet Take 1 tablet (5 mg total) by mouth daily. (new directions) 08/12/21   [name], MD  triamcinolone (KENALOG) 0.025 % ointment Apply 1 application topically 2 (two) times daily. 08/07/20   Nicholas Lose, MD  valsartan (DIOVAN) 80 MG tablet TAKE ONE (1) TABLET EACH DAY 08/12/21   [name], MD     Allergies:     Allergies  Allergen Reactions   Cantaloupe Extract Allergy Skin Test Shortness Of Breath   Contrast Media [Iodinated Diagnostic Agents] Shortness Of Breath and Rash   Pravastatin Other (See Comments)    Legs hurt   Zosyn [Piperacillin Sod-Tazobactam So] Rash and Other (See Comments)    Temperature increase, facial flushing   Latex Rash    Redness, itch      Physical Exam:   Vitals  Blood pressure 121/66, pulse 89, temperature 98 F (36.7 C), temperature source Oral, resp. rate 16, height _0  (1.727 m), weight 60 kg, SpO2 98 %.   1.  General: Patient lying supine in bed,  no acute distress   2. Psychiatric: Alert and oriented x 3,  mood and behavior normal for situation, pleasant and cooperative with exam   3. Neurologic: Speech and language are normal, face is symmetric, moves all extremities voluntarily except for the left upper extremity, patient reports she had weakness after her last seizure in her left upper extremity  4. HEENMT:  Head is atraumatic, normocephalic, pupils reactive to light, neck is supple, trachea is midline, mucous membranes are moist   5. Respiratory : Lungs are clear to auscultation bilaterally  without wheezing, rhonchi, rales, no cyanosis, no increase in work of breathing or accessory muscle use   6. Cardiovascular : Heart rate normal, rhythm is regular, no murmurs, rubs or gallops, pitting edema present, peripheral pulses palpated   7. Gastrointestinal:  Abdomen is soft, nondistended, nontender to palpation bowel sounds active, no masses or organomegaly palpated   8. Skin:  Skin is warm, dry and intact without rashes, acute lesions, or ulcers on limited exam bruising over bilateral lower extremities but especially on the right   9.Musculoskeletal:  No acute deformities or trauma, no asymmetry in tone, pitting edema present, peripheral pulses palpated, no tenderness to palpation in the extremities     Data Review:    CBC Recent Labs  Lab 09/24/21 0028  WBC 2.2*  HGB 9.8*  HCT 30.4*  PLT 61*  MCV 114.3*  MCH 36.8*  MCHC 32.2  RDW 14.9   ------------------------------------------------------------------------------------------------------------------  Results for orders placed or performed during the hospital encounter of 09/23/21 (from the past 48 hour(s))  CBC     Status: Abnormal   Collection Time: 09/24/21 12:28 AM  Result Value Ref Range   WBC 2.2 (L) 4.0 - 10.5 K/uL   RBC 2.66 (L) 3.87 - 5.11 MIL/uL   Hemoglobin 9.8 (L) 12.0 - 15.0 g/dL   HCT 30.4 (L) 36.0 - 46.0 %   MCV 114.3 (H) 80.0 - 100.0 fL   MCH 36.8 (H) 26.0 - 34.0 pg   MCHC 32.2 30.0 - 36.0 g/dL   RDW 14.9 11.5 - 15.5 %   Platelets 61 (L) 150 - 400 K/uL    Comment: SPECIMEN CHECKED FOR CLOTS Immature Platelet Fraction may be clinically indicated, consider ordering this additional test [id number] CONSISTENT WITH PREVIOUS RESULT REPEATED TO VERIFY    nRBC 0.0 0.0 - 0.2 %    Comment: Performed at [facility], [address], [place], [place] [address]  Comprehensive metabolic panel     Status: Abnormal   Collection Time: 09/24/21 12:28 AM  Result Value Ref Range   Sodium 139 135 - 145  mmol/L   Potassium 3.3 (L) 3.5 - 5.1 mmol/L   Chloride 105 98 - 111 mmol/L   CO2 27 22 - 32 mmol/L   Glucose, Bld 86 70 - 99 mg/dL    Comment: Glucose reference range applies only to samples taken after fasting for at least 8 hours.   BUN 20 8 - 23 mg/dL   Creatinine, Ser 0.64 0.44 - 1.00 mg/dL   Calcium 8.3 (L) 8.9 - 10.3 mg/dL   Total Protein 5.3 (L) 6.5 - 8.1 g/dL   Albumin 2.7 (L) 3.5 - 5.0 g/dL   AST 75 (H) 15 - 41 U/L   ALT 55 (H) 0 - 44 U/L   Alkaline Phosphatase 362 (H) 38 - 126 U/L   Total Bilirubin 0.9 0.3 - 1.2 mg/dL   GFR, Estimated >60 >60 mL/min    Comment: (NOTE) Calculated using the CKD-EPI Creatinine Equation (2021)    Anion gap 7 5 -  15    Comment: Performed at [facility], [address] [place], [place] [address]  CK     Status: None   Collection Time: 09/24/21 12:28 AM  Result Value Ref Range   Total CK 44 38 - 234 U/L    Comment: Performed at [facility], [address], [place], [place] [address]  Lactic acid, plasma     Status: Abnormal   Collection Time: 09/24/21 12:28 AM  Result Value Ref Range   Lactic Acid, Venous 3.1 (HH) 0.5 - 1.9 mmol/L    Comment: CRITICAL RESULT CALLED TO, READ BACK BY AND VERIFIED WITH: REECE, RN @ 0370 ON 09/24/21 Sandy Salaam Performed at [facility], [address], [place], [place] [address]   Protime-INR     Status: None   Collection Time: 09/24/21 12:28 AM  Result Value Ref Range   Prothrombin Time 12.6 11.4 - 15.2 seconds   INR 0.9 0.8 - 1.2    Comment: (NOTE) INR goal varies based on device and disease states. Performed at [facility], [address] [place], [place] [address]   Troponin I (High Sensitivity)     Status: Abnormal   Collection Time: 09/24/21 12:28 AM  Result Value Ref Range   Troponin I (High Sensitivity) 51 (H) <18 ng/L    Comment: (NOTE) Elevated high sensitivity troponin I (hsTnI) values and significant  changes across serial measurements may suggest ACS but many other  chronic and acute  conditions are known to elevate hsTnI results.  Refer to the "Links" section for chest pain algorithms and additional  guidance. Performed at [facility], [address], [place], [place] [address]   Troponin I (High Sensitivity)     Status: Abnormal   Collection Time: 09/24/21  2:31 AM  Result Value Ref Range   Troponin I (High Sensitivity) 104 (HH) <18 ng/L    Comment: DELTA CHECK NOTED CRITICAL RESULT CALLED TO, READ BACK BY AND VERIFIED WITH: TONY WALKER @ 2800 ON 09/24/21 C VARNER (NOTE) Elevated high sensitivity troponin I (hsTnI) values and significant  changes across serial measurements may suggest ACS but many other  chronic and acute conditions are known to elevate hsTnI results.  Refer to the Links section for chest pain algorithms and additional  guidance. Performed at [facility], [address] [place], [place] [address]   Resp Panel by RT-PCR (Flu A&B, Covid) Nasopharyngeal Swab     Status: None   Collection Time: 09/24/21  3:11 AM   Specimen: Nasopharyngeal Swab; Nasopharyngeal(NP) swabs in vial transport medium  Result Value Ref Range   SARS Coronavirus 2 by RT PCR NEGATIVE NEGATIVE    Comment: (NOTE) SARS-CoV-2 target nucleic acids are NOT DETECTED.  The SARS-CoV-2 RNA is generally detectable in upper respiratory specimens during the acute phase of infection. The lowest concentration of SARS-CoV-2 viral copies this assay can detect is 138 copies/mL. A negative result does not preclude SARS-Cov-2 infection and should not be used as the sole basis for treatment or other patient management decisions. A negative result may occur with  improper specimen collection/handling, submission of specimen other than nasopharyngeal swab, presence of viral mutation(s) within the areas targeted by this assay, and inadequate number of viral copies(<138 copies/mL). A negative result must be combined with clinical observations, patient history, and epidemiological information.  The expected result is Negative.  Fact Sheet for Patients:  EntrepreneurPulse.com.au  Fact Sheet for Healthcare Providers:  IncredibleEmployment.be  This test is no t yet approved or cleared by  the [employer] and  has been authorized for detection and/or diagnosis of SARS-CoV-2 by FDA under an Emergency Use Authorization (EUA). This EUA will remain  in effect (meaning this test can be used) for the duration of the COVID-19 declaration under Section 564(b)(1) of the Act, 21 U.S.C.section 360bbb-3(b)(1), unless the authorization is terminated  or revoked sooner.       Influenza A by PCR NEGATIVE NEGATIVE   Influenza B by PCR NEGATIVE NEGATIVE    Comment: (NOTE) The Xpert Xpress SARS-CoV-2/FLU/RSV plus assay is intended as an aid in the diagnosis of influenza from Nasopharyngeal swab specimens and should not be used as a sole basis for treatment. Nasal washings and aspirates are unacceptable for Xpert Xpress SARS-CoV-2/FLU/RSV testing.  Fact Sheet for Patients: EntrepreneurPulse.com.au  Fact Sheet for Healthcare Providers: IncredibleEmployment.be  This test is not yet approved or cleared by the [place] FDA and has been authorized for detection and/or diagnosis of SARS-CoV-2 by FDA under an Emergency Use Authorization (EUA). This EUA will remain in effect (meaning this test can be used) for the duration of the COVID-19 declaration under Section 564(b)(1) of the Act, 21 U.S.C. section 360bbb-3(b)(1), unless the authorization is terminated or revoked.  Performed at [facility], [address], [place], [place] [address]   Lactic acid, plasma     Status: None   Collection Time: 09/24/21  3:50 AM  Result Value Ref Range   Lactic Acid, Venous 1.0 0.5 - 1.9 mmol/L    Comment: Performed at [facility], [address], [place], [place] [address]   *Note: Due to a large number of results and/or  encounters for the requested time period, some results have not been displayed. A complete set of results can be found in Results Review.    Chemistries  Recent Labs  Lab 09/24/21 0028  NA 139  K 3.3*  CL 105  CO2 27  GLUCOSE 86  BUN 20  CREATININE 0.64  CALCIUM 8.3*  AST 75*  ALT 55*  ALKPHOS 362*  BILITOT 0.9   ------------------------------------------------------------------------------------------------------------------  ------------------------------------------------------------------------------------------------------------------ GFR: Estimated Creatinine Clearance: 63.8 mL/min (by C-G formula based on SCr of 0.64 mg/dL). Liver Function Tests: Recent Labs  Lab 09/24/21 0028  AST 75*  ALT 55*  ALKPHOS 362*  BILITOT 0.9  PROT 5.3*  ALBUMIN 2.7*   No results for input(s): LIPASE, AMYLASE in the last 168 hours. No results for input(s): AMMONIA in the last 168 hours. Coagulation Profile: Recent Labs  Lab 09/24/21 0028  INR 0.9   Cardiac Enzymes: Recent Labs  Lab 09/24/21 0028  CKTOTAL 44   BNP (last 3 results) No results for input(s): PROBNP in the last 8760 hours. HbA1C: No results for input(s): HGBA1C in the last 72 hours. CBG: No results for input(s): GLUCAP in the last 168 hours. Lipid Profile: No results for input(s): CHOL, HDL, LDLCALC, TRIG, CHOLHDL, LDLDIRECT in the last 72 hours. Thyroid Function Tests: No results for input(s): TSH, T4TOTAL, FREET4, T3FREE, THYROIDAB in the last 72 hours. Anemia Panel: No results for input(s): VITAMINB12, FOLATE, FERRITIN, TIBC, IRON, RETICCTPCT in the last 72 hours.  --------------------------------------------------------------------------------------------------------------- Urine analysis:    Component Value Date/Time   COLORURINE AMBER (A) 05/13/2021 1402   APPEARANCEUR CLOUDY (A) 05/13/2021 1402   APPEARANCEUR Clear 04/26/2021 1128   LABSPEC 1.022 05/13/2021 1402   PHURINE 5.0 05/13/2021 1402    GLUCOSEU NEGATIVE 05/13/2021 1402   HGBUR NEGATIVE 05/13/2021 1402   BILIRUBINUR NEGATIVE 05/13/2021 1402   BILIRUBINUR Negative  04/26/2021 1128   KETONESUR 5 (A) 05/13/2021 1402   PROTEINUR >=300 (A) 05/13/2021 1402   NITRITE NEGATIVE 05/13/2021 1402   LEUKOCYTESUR LARGE (A) 05/13/2021 1402      Imaging Results:    DG Chest 1 View  Result Date: 09/24/2021 CLINICAL DATA:  Recent seizure-like activity, initial encounter EXAM: CHEST  1 VIEW COMPARISON:  09/15/2021 FINDINGS: Left chest wall port is again noted and stable. Cardiac shadow is within normal limits. The lungs are well aerated bilaterally. No focal infiltrate is seen. Sclerotic changes are noted in the bony structures consistent with metastatic disease. IMPRESSION: Diffuse sclerotic metastatic disease. No intrathoracic abnormality is noted. Electronically Signed   By: Inez Catalina M.D.   On: 09/24/2021 00:35   CT HEAD WO CONTRAST (5MM)  Result Date: 09/24/2021 CLINICAL DATA:  History of breast carcinoma with known metastatic disease. EXAM: CT HEAD WITHOUT CONTRAST TECHNIQUE: Contiguous axial images were obtained from the base of the skull through the vertex without intravenous contrast. COMPARISON:  08/28/2021 FINDINGS: Brain: Persistent vasogenic edema is noted within the right frontal and parietal lobes similar to that seen on the prior exam. This is again consistent with the known history of metastatic disease. No findings to suggest acute hemorrhage or acute infarction are noted. Vascular: No hyperdense vessel or unexpected calcification. Skull: Diffuse bony metastatic disease is noted consistent with the given clinical history. Sinuses/Orbits: No acute finding. Other: None. IMPRESSION: Stable vasogenic edema throughout the right frontal and parietal lobe consistent with the known history of metastatic disease. These changes were better visualized on recent MRI examination. No new focal abnormality is noted. Findings consistent with  diffuse bony metastatic disease. Electronically Signed   By: Inez Catalina M.D.   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 Lactic acidosis Likely secondary to seizure activity Trend Elevated troponin From 50, 100 Likely secondary to demand ischemia Continue to monitor  Hypothyroidism Continue Synthroid Hypokalemia Replace and recheck Protein calorie malnutrition Encourage nutrient dense food choices Hypertension Continue ARB Hyperlipidemia Continue statin    DVT Prophylaxis-   Heparin - SCDs   AM Labs Ordered, also please review Full Orders  Family Communication: Admission, patients condition and plan of care including tests being ordered have been discussed with the patient and husband who indicate understanding and agree with the plan and Code Status.  Code Status: DNR  Admission status: Observation Disposition: Anticipated Discharge date 48 hours discharge to home/rehab  Time spent in minutes : [place]

## 2021-09-24 NOTE — Evaluation (Signed)
Physical Therapy Evaluation Patient Details Name: [name] Riley MRN: [id number] DOB: [birth year] Today's Date: 09/24/2021  History of Present Illness  [name] Riley  is a 70 y.o. female, with history of metastatic breast cancer, coronary artery disease, heart attack, hyperlipidemia, hypothyroidism, history of chemo and radiation, currently on chemo, presents to the ED with a chief complaint of seizures.  Patient reports she remembers the entire thing.  She had shaking and pain in her neck.  She reports that the shaking started in her left upper extremity and then proceeded to full body.  She had no loss of consciousness.  This is similar to the seizure she had 3-4 weeks ago.  She reports preceding symptoms including a headache that starts on the top right of her head and radiates around to her left cheekbone.  She also has stomach sensation that feels like waves.  Patient reports that the symptoms only happen prior to his seizure.  Patient remembers the ride to the hospital.  She remembers feeling like she was going to fall in the bathroom floor at home.  She did not fall.  Patient ambulates on her own without a walker or wheelchair.  Patient reports her last chemo was November 11, and her next is December 12.  She has felt fatigued more than usual per her report.  She also reports 2 days of decreased hearing in the left ear.  It sounds like low volume, but does not sound muffled.  Patient reports swelling in her feet and legs.  She reports she takes Lasix every day and has been compliant with it.  Patient reports she is also been compliant with her Keppra.  Her Keppra was recently increased from 500 mg twice daily to 2000 mg twice daily approximately 2 weeks ago.  Patient has no other complaints at this time.   Clinical Impression  Patient presents with severe left sided weakness, unable grip RW with left hand , at high risk for falls due to left sided weakness and limited to a few side steps at bedside with  Max hand held assist.  Patient put back to bed with Mod assist to reposition.  Patient will benefit from continued skilled physical therapy in hospital and recommended venue below to increase strength, balance, endurance for safe ADLs and gait.      Recommendations for follow up therapy are one component of a multi-disciplinary discharge planning process, led by the attending physician.  Recommendations may be updated based on patient status, additional functional criteria and insurance authorization.  Follow Up Recommendations Skilled nursing-short term rehab (<3 hours/day)    Assistance Recommended at Discharge Intermittent Supervision/Assistance  Functional Status Assessment Patient has had a recent decline in their functional status and demonstrates the ability to make significant improvements in function in a reasonable and predictable amount of time.  Engineer, technical sales;Other (comment) (small based quad-cane)    Recommendations for Other Services       Precautions / Restrictions Precautions Precautions: Fall Restrictions Weight Bearing Restrictions: No      Mobility  Bed Mobility Overal bed mobility: Needs Assistance Bed Mobility: Supine to Sit;Sit to Supine     Supine to sit: Mod assist Sit to supine: Mod assist;Max assist   General bed mobility comments: slow labored movement with limited use of LUE due to weakness, near fall during sit to supine    Transfers Overall transfer level: Needs assistance Equipment used: Rolling walker (2 wheels);None;1 person hand held assist Transfers: Sit to/from Stand Sit  to Stand: Mod assist           General transfer comment: very unsteady on feet with buckling of LLE and unable to grip walker with left hand due to weakness    Ambulation/Gait Ambulation/Gait assistance: Max assist Gait Distance (Feet): 4 Feet Assistive device: Rolling walker (2 wheels);1 person hand held assist Gait Pattern/deviations:  Decreased step length - right;Decreased step length - left;Decreased stride length;Decreased stance time - left;Antalgic Gait velocity: slow     General Gait Details: limited to a few slow unsteady side steps due to buckling of LLE and poor left hand grip when attempting to use RW  Stairs            Wheelchair Mobility    Modified Rankin (Stroke Patients Only)       Balance Overall balance assessment: Needs assistance Sitting-balance support: Feet supported;No upper extremity supported Sitting balance-Leahy Scale: Poor Sitting balance - Comments: fair/poor seated at EOB   Standing balance support: During functional activity;Single extremity supported Standing balance-Leahy Scale: Poor Standing balance comment: hand held assist and when attempting to use RW                             Pertinent Vitals/Pain Pain Assessment: No/denies pain    Home Living Family/patient expects to be discharged to:: Private residence Living Arrangements: Spouse/significant other Available Help at Discharge: Family;Available 24 hours/day Type of Home: House Home Access: Stairs to enter Entrance Stairs-Rails: Right;Left;Can reach both Entrance Stairs-Number of Steps: 4 Alternate Level Stairs-Number of Steps: 8 landing + 8 Home Layout: Two level;Able to live on main level with bedroom/bathroom;Full bath on main level Home Equipment: Grab bars - tub/shower;None      Prior Function Prior Level of Function : Needs assist       Physical Assist : Mobility (physical) Mobility (physical): Bed mobility;Transfers;Gait;Stairs   Mobility Comments: household ambulator without AD ADLs Comments: assisted by family     Hand Dominance   Dominant Hand: Right    Extremity/Trunk Assessment   Upper Extremity Assessment Upper Extremity Assessment: Generalized weakness;LUE deficits/detail LUE Deficits / Details: grossly -2/5 LUE Sensation: decreased proprioception LUE Coordination:  decreased gross motor;decreased fine motor    Lower Extremity Assessment Lower Extremity Assessment: Generalized weakness;LLE deficits/detail LLE Deficits / Details: grossly -3/5 LLE Sensation: decreased proprioception LLE Coordination: decreased gross motor;decreased fine motor    Cervical / Trunk Assessment Cervical / Trunk Assessment: Normal  Communication   Communication: No difficulties  Cognition Arousal/Alertness: Awake/alert Behavior During Therapy: WFL for tasks assessed/performed Overall Cognitive Status: Within Functional Limits for tasks assessed                                          General Comments      Exercises     Assessment/Plan    PT Assessment Patient needs continued PT services  PT Problem List Decreased strength;Decreased activity tolerance;Decreased balance;Decreased mobility       PT Treatment Interventions DME instruction;Gait training;Stair training;Functional mobility training;Therapeutic exercise;Balance training;Neuromuscular re-education;Patient/family education;Therapeutic activities    PT Goals (Current goals can be found in the Care Plan section)  Acute Rehab PT Goals Patient Stated Goal: return home with family to assist PT Goal Formulation: With patient/family Time For Goal Achievement: 10/08/21 Potential to Achieve Goals: Good    Frequency Min 3X/week   Barriers to  discharge        Co-evaluation               AM-PAC PT "6 Clicks" Mobility  Outcome Measure Help needed turning from your back to your side while in a flat bed without using bedrails?: A Lot Help needed moving from lying on your back to sitting on the side of a flat bed without using bedrails?: A Lot Help needed moving to and from a bed to a chair (including a wheelchair)?: A Lot Help needed standing up from a chair using your arms (e.g., wheelchair or bedside chair)?: A Lot Help needed to walk in hospital room?: A Lot Help needed  climbing 3-5 steps with a railing? : Total 6 Click Score: 11    End of Session   Activity Tolerance: Patient tolerated treatment well;Patient limited by fatigue Patient left: in bed;with call bell/phone within reach;with family/visitor present Nurse Communication: Mobility status PT Visit Diagnosis: Unsteadiness on feet (R26.81);Other abnormalities of gait and mobility (R26.89);Muscle weakness (generalized) (M62.81)    Time: 5465-6812 PT Time Calculation (min) (ACUTE ONLY): 26 min   Charges:   PT Evaluation $PT Eval Moderate Complexity: 1 Mod PT Treatments $Therapeutic Activity: 23-37 mins        3:26 PM, 09/24/21 Lonell Grandchild, MPT Physical Therapist with [facility] [phone] office [phone] mobile phone

## 2021-09-24 NOTE — Progress Notes (Signed)
Patient seen and examined.  Admitted after midnight secondary to seizure event.  Patient with underlying history of brain metastasis.  Lactic acidosis and hypokalemia also appreciated at time of admission.  CT scan of the head demonstrating stable vasogenic edema throughout the right frontal and parietal lobe consistent with known history of metastatic disease.  Patient is otherwise hemodynamically stable and currently demonstrating good mentation.  Please refer to H&P written by Dr.Zierle-Ghosh for further info/details on admission   Plan: -MRI demonstrating worsening vasogenic edema and lesions. -decadron dose has been increased. -case discussed with Dr. Mickeal Skinner (who looked at her images) and recommend adjustment to her medications, overnight observation and outpatient follow up next week. -Patient's keppra increase using 1500mg  BID a day for better control. -Continue IV fluids and clinical support -steroids also increased and with plans to discharge on 4mg  BID. -patient is safe to stay here and will follow up with oncologist and neuro-oncology service as an outpatient (Dr. Vaslow/Dr. Lindi Adie). -patient is DNR.   Barton Dubois MD [phone]

## 2021-09-24 NOTE — TOC Initial Note (Signed)
Transition of Care [facility]) - Initial/Assessment Note    Patient Details  Name: [name] Riley MRN: [id number] Date of Birth: 12-20-1950  Transition of Care [facility]) CM/SW Contact:    Iona Beard, [place] Phone Number: 09/24/2021, 1:08 PM  Clinical Narrative:                 [facility] consulted for SNF recommendation. CSW met in room with pt and spouse to complete evaluation. Pt lives with her husband and is typically independent in completing their ADLs. Pts husband provides transportation when needed. Pt has not had [place] services. Pt has a walker in the home. Pt has requested we order a wheelchair for d/c and has requested [name] be used. CSW spoke with pt and spouse about SNF at D/C, they have requested to go home with [facility] services rather than SNF. They would like to use [facility] PT/RN/OT/Aide. CSW gave referral to [facility] with [facility] who accepted referral. TOC to follow.   Expected Discharge Plan: [place] Barriers to Discharge: Continued Medical Work up   Patient Goals and CMS Choice Patient states their goals for this hospitalization and ongoing recovery are:: Home with [facility] CMS Medicare.gov Compare Post Acute Care list provided to:: Patient Choice offered to / list presented to : Patient  Expected Discharge Plan and Services Expected Discharge Plan: [place] In-house Referral: Clinical Social Work Discharge Planning Services: CM Consult Post Acute Care Choice: Home Health, Durable Medical Equipment Living arrangements for the past 2 months: [place]                 DME Arranged: Walker rolling DME Agency: [employer]       HH Arranged: PT, OT, RN, Nurse's Aide [place] Agency: [facility]        Prior Living Arrangements/Services Living arrangements for the past 2 months: Single Family Home Lives with:: Spouse Patient language and need for interpreter reviewed:: Yes Do you feel safe  going back to the place where you live?: Yes      Need for Family Participation in Patient Care: Yes (Comment) Care giver support system in place?: Yes (comment) Current home services: DME [facility]) Criminal Activity/Legal Involvement Pertinent to Current Situation/Hospitalization: No - Comment as needed  Activities of Daily Living Home Assistive Devices/Equipment: None ADL Screening (condition at time of admission) Patient's cognitive ability adequate to safely complete daily activities?: Yes Is the patient deaf or have difficulty hearing?: No Does the patient have difficulty seeing, even when wearing glasses/contacts?: Yes Does the patient have difficulty concentrating, remembering, or making decisions?: No Patient able to express need for assistance with ADLs?: Yes Does the patient have difficulty dressing or bathing?: Yes Independently performs ADLs?: Yes (appropriate for developmental age) Does the patient have difficulty walking or climbing stairs?: Yes Weakness of Legs: Left Weakness of Arms/Hands: Left  Permission Sought/Granted                  Emotional Assessment Appearance:: Appears stated age Attitude/Demeanor/Rapport: Engaged Affect (typically observed): Accepting Orientation: : Oriented to Self, Oriented to Place, Oriented to  Time, Oriented to Situation Alcohol / Substance Use: Not Applicable Psych Involvement: No (comment)  Admission diagnosis:  Seizure ([place]) [R56.9] Seizures ([place]) [R56.9] Vasogenic brain edema [facility]) [G93.6] Patient Active Problem List   Diagnosis Date Noted   Seizure ([place]) 09/24/2021   Seizures ([place]) 08/31/2021   Fall 03/11/2021   Left hip pain 03/11/2021  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   Brain metastasis ([place]) 07/13/2018   Goals of care, counseling/discussion 06/05/2018   UTI (urinary tract infection) 05/31/2018   Sepsis secondary to UTI ([place]) 05/31/2018   Thrombocytopenia ([place]) 05/31/2018   Hypokalemia  05/31/2018   Port-A-Cath in place 03/23/2018   Elevated LFTs    Metastatic breast cancer ([place]) 06/29/2017   Nodule of skin of breast 03/06/2017   Abscess, abdomen 07/06/2016   Metastasis to bone ([place]) 06/01/2016   Pneumothorax on left 04/25/2016   Chest pain 10/23/2014   Radiation-induced dermatitis 07/27/2012   Hypertension    Breast cancer of upper-outer quadrant of left female breast ([place]) 09/01/2011   Hypothyroidism 06/12/2009   Mixed hyperlipidemia 06/12/2009   Essential hypertension 06/12/2009   Coronary atherosclerosis 06/12/2009   DIZZINESS 06/12/2009   PALPITATIONS 06/12/2009   PCP:  [name], MD Pharmacy:   [facility], [place] [address] Phone: [phone] Fax: [place] [address] Phone: [phone] Fax: [phone]     Social Determinants of Health (SDOH) Interventions    Readmission Risk Interventions No flowsheet data found.

## 2021-09-24 NOTE — NC FL2 (Signed)
[place] MEDICAID FL2 LEVEL OF CARE SCREENING TOOL     IDENTIFICATION  Patient Name: [name] Riley Birthdate: [birth year] Sex: female Admission Date (Current Location): 09/23/2021  [facility] and [place] Number:  [employer] and Address:  [place] [address] [place]      Provider Number: [id number]  Attending Physician Name and Address:  Barton Dubois, MD  Relative Name and Phone Number:       Current Level of Care: Hospital Recommended Level of Care: [place] Prior Approval Number:    Date Approved/Denied:   PASRR Number: [id number]  Discharge Plan: SNF    Current Diagnoses: Patient Active Problem List   Diagnosis Date Noted   Seizure ([place]) 09/24/2021   Seizures ([place]) 08/31/2021   Fall 03/11/2021   Left hip pain 03/11/2021   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   Brain metastasis ([place]) 07/13/2018   Goals of care, counseling/discussion 06/05/2018   UTI (urinary tract infection) 05/31/2018   Sepsis secondary to UTI ([place]) 05/31/2018   Thrombocytopenia ([place]) 05/31/2018   Hypokalemia 05/31/2018   Port-A-Cath in place 03/23/2018   Elevated LFTs    Metastatic breast cancer ([place]) 06/29/2017   Nodule of skin of breast 03/06/2017   Abscess, abdomen 07/06/2016   Metastasis to bone ([place]) 06/01/2016   Pneumothorax on left 04/25/2016   Chest pain 10/23/2014   Radiation-induced dermatitis 07/27/2012   Hypertension    Breast cancer of upper-outer quadrant of left female breast ([place]) 09/01/2011   Hypothyroidism 06/12/2009   Mixed hyperlipidemia 06/12/2009   Essential hypertension 06/12/2009   Coronary atherosclerosis 06/12/2009   DIZZINESS 06/12/2009   PALPITATIONS 06/12/2009    Orientation RESPIRATION BLADDER Height & Weight     Self, Time, Situation, Place  Normal Continent Weight: 132 lb 4.4 oz (60 kg) Height:  5\' 8"  (172.7 cm)  BEHAVIORAL SYMPTOMS/MOOD NEUROLOGICAL BOWEL NUTRITION STATUS       Continent Diet (See d/c summary)  AMBULATORY STATUS COMMUNICATION OF NEEDS Skin   Extensive Assist Verbally Normal                       Personal Care Assistance Level of Assistance  Bathing, Feeding, Dressing, Total care Bathing Assistance: Limited assistance Feeding assistance: Independent Dressing Assistance: Limited assistance Total Care Assistance: Limited assistance   Functional Limitations Info  Sight, Hearing, Speech Sight Info: Adequate Hearing Info: Adequate Speech Info: Adequate    SPECIAL CARE FACTORS FREQUENCY  PT (By licensed PT), OT (By licensed OT)     PT Frequency: 5 times weekly OT Frequency: 5 times weekly            Contractures Contractures Info: Not present    Additional Factors Info  Code Status, Allergies Code Status Info: DNR Allergies Info: Cantaloupe Extract Allergy Skin Test   Contrast Media (Iodinated Diagnostic Agents)   Pravastatin   Zosyn (Piperacillin Sod-tazobactam So)   Latex           Current Medications (09/24/2021):  This is the current hospital active medication list Current Facility-Administered Medications  Medication Dose Route Frequency Provider Last Rate Last Admin   acetaminophen (TYLENOL) tablet 650 mg  650 mg Oral Q6H PRN Zierle-Ghosh, Asia B, DO       Or   acetaminophen (TYLENOL) suppository 650 mg  650 mg Rectal Q6H PRN Zierle-Ghosh, Asia B, DO       dexamethasone (DECADRON) injection 4 mg  4 mg Intravenous Q6H Barton Dubois,  MD   4 mg at 09/24/21 1137   feeding supplement (BOOST / RESOURCE BREEZE) liquid 1 Container  1 Container Oral TID BM Zierle-Ghosh, Asia B, DO       heparin injection 5,000 Units  5,000 Units Subcutaneous Q8H Zierle-Ghosh, Asia B, DO   5,000 Units at 09/24/21 0546   irbesartan (AVAPRO) tablet 75 mg  75 mg Oral Daily Zierle-Ghosh, Asia B, DO       levETIRAcetam (KEPPRA) tablet 1,500 mg  1,500 mg Oral BID Barton Dubois, MD       levothyroxine (SYNTHROID) tablet 100 mcg  100 mcg Oral QAC  breakfast Zierle-Ghosh, Asia B, DO   100 mcg at 09/24/21 0547   methocarbamol (ROBAXIN) 500 mg in dextrose 5 % 50 mL IVPB  500 mg Intravenous Q6H PRN Zierle-Ghosh, Asia B, DO       metoprolol succinate (TOPROL-XL) 24 hr tablet 100 mg  100 mg Oral Daily Zierle-Ghosh, Asia B, DO   100 mg at 09/24/21 1138   morphine 2 MG/ML injection 2 mg  2 mg Intravenous Q2H PRN Zierle-Ghosh, Asia B, DO       ondansetron (ZOFRAN) tablet 4 mg  4 mg Oral Q6H PRN Zierle-Ghosh, Asia B, DO       Or   ondansetron (ZOFRAN) injection 4 mg  4 mg Intravenous Q6H PRN Zierle-Ghosh, Asia B, DO       oxyCODONE (Oxy IR/ROXICODONE) immediate release tablet 5 mg  5 mg Oral Q4H PRN Zierle-Ghosh, Asia B, DO       potassium chloride (KLOR-CON) packet 40 mEq  40 mEq Oral Daily Barton Dubois, MD   40 mEq at 09/24/21 1144   rosuvastatin (CRESTOR) tablet 5 mg  5 mg Oral Daily Zierle-Ghosh, Asia B, DO       Facility-Administered Medications Ordered in Other Encounters  Medication Dose Route Frequency Provider Last Rate Last Admin   sodium chloride flush (NS) 0.9 % injection 10 mL  10 mL Intracatheter PRN Nicholas Lose, MD   10 mL at 05/03/21 1653     Discharge Medications: Please see discharge summary for a list of discharge medications.  Relevant Imaging Results:  Relevant Lab Results:   Additional Information SSN: [id number] [place]

## 2021-09-24 NOTE — ED Notes (Signed)
PT here and notified pt is supposed to go upstairs but he wants to assess her first.

## 2021-09-24 NOTE — Plan of Care (Signed)
  Problem: Acute Rehab PT Goals(only PT should resolve) Goal: Pt Will Go Supine/Side To Sit Outcome: Progressing Flowsheets (Taken 09/24/2021 1529) Pt will go Supine/Side to Sit:  with minimal assist  with moderate assist Goal: Patient Will Transfer Sit To/From Stand Outcome: Progressing Flowsheets (Taken 09/24/2021 1529) Patient will transfer sit to/from stand:  with moderate assist  with minimal assist Goal: Pt Will Transfer Bed To Chair/Chair To Bed Outcome: Progressing Flowsheets (Taken 09/24/2021 1529) Pt will Transfer Bed to Chair/Chair to Bed:  with min assist  with mod assist Goal: Pt Will Ambulate Outcome: Progressing Flowsheets (Taken 09/24/2021 1529) Pt will Ambulate:  15 feet  with moderate assist  with cane Note: Quad- cane, hemi-walker   3:30 PM, 09/24/21 Lonell Grandchild, MPT Physical Therapist with [facility] [phone] office [phone] mobile phone

## 2021-09-24 NOTE — ED Provider Notes (Signed)
[facility] Emergency Department Provider Note MRN:  [id number]  Arrival date & time: 09/24/21     Chief Complaint   Seizures   History of Present Illness   [name] Riley is a 70 y.o. year-old female with a history of metastatic breast cancer presenting to the ED with chief complaint of seizures.  History of metastatic breast cancer with seizure disorder felt to be due to edema on the brain from prior radiation.  Had a similar seizure event this evening where her left side begins to have tremors and then she has a full tonic-clonic seizure.  Continues to have post seizure left-sided tremulous behavior.  She is endorsing some neck soreness at this time but otherwise no significant symptoms.  Received Versed with EMS  I was unable to obtain an accurate HPI, PMH, or ROS due to the patient's altered mental status.  Level 5 caveat.  Review of Systems  A complete 10 system review of systems was obtained and all systems are negative except as noted in the HPI and PMH.   Patient's Health History    Past Medical History:  Diagnosis Date   Allergy    Breast cancer ([place]) 08/25/2011   L , invasive ductal carcinoma, ER/PR +,HER2 -   Cancer ([place])    left breast cancer   Coronary artery disease 2001   Heart attack [facility]) 09/2000   Sep 25, 2000  --no intervention   History of chemotherapy comp. 08/22/2012   4 cycles of FEC and $ cycles of Taxotere   Hyperlipidemia    Hypertension    Hypothyroidism    PONV (postoperative nausea and vomiting)    gets sick from anesthesia   Status post radiation therapy 07/09/12 - 08/22/2012   Left Breast, 60.4 gray    Past Surgical History:  Procedure Laterality Date   ABDOMINAL HYSTERECTOMY  1998   TAH, oophorectomy   APPENDECTOMY  1970   BREAST CYST EXCISION Left 07/29/2020   Procedure: WIDE EXCISION OF LEFT MASTECTOMY INCISION;  Surgeon: Donnie Mesa, MD;  Location: [place];  Service: General;  Laterality:  Left;   [place]   removal of benign lump in rt breast   BREAST SURGERY  11/14/11   right simple mastectomy, left mrm   INCISION AND DRAINAGE OF WOUND Left 07/01/2017   Procedure: IRRIGATION AND DEBRIDEMENT CHEST WALL ABSCESS;  Surgeon: Donnie Mesa, MD;  Location: [facility];  Service: General;  Laterality: Left;   MASS EXCISION Left 06/22/2017   Procedure: EXCISION OF CHEST WALL MASSES;  Surgeon: Donnie Mesa, MD;  Location: [place];  Service: General;  Laterality: Left;   MASTECTOMY Bilateral    for left breast cancer   OVARIAN CYST SURGERY Right [place]  08/30/2012   Procedure: REMOVAL PORT-A-CATH;  Surgeon: Imogene Burn. [name], MD;  Location: [place];  Service: General;  Laterality: Right;  port removal   PORTACATH PLACEMENT  11/14/2011   Procedure: INSERTION PORT-A-CATH;  Surgeon: Imogene Burn. [name], MD;  Location: [place];  Service: General;  Laterality: Right;   PORTACATH PLACEMENT N/A 04/25/2016   Procedure: INSERTION PORT-A-CATH LEFT CHEST;  Surgeon: Donnie Mesa, MD;  Location: [place];  Service: General;  Laterality: N/A;   skin tags  05/09/1997   left axillary left neck skin tags   TONSILLECTOMY  1968    Family History  Problem Relation Age of Onset   Hypertension Maternal Grandmother  Diabetes Maternal Grandmother    Cancer Father 92       lung cancer and Prostate Cancer   Hypertension Mother    Cancer Paternal Aunt        ovarian   Cancer Cousin        breast, paternal cousin   Cancer Paternal Uncle        stomach   Cancer Paternal Grandfather        Esophagus   Colon cancer Neg Hx     Social History   Socioeconomic History   Marital status: Married    Spouse name: Not on file   Number of children: 3   Years of education: Not on file   Highest education level: High school graduate  Occupational History   Occupation: Retired  Tobacco Use   Smoking status: Never   Smokeless tobacco: Never  IT trainer Use: Never used  Substance and Sexual Activity   Alcohol use: No   Drug use: No   Sexual activity: Yes    Birth control/protection: Surgical    Comment: menarche 48, Parity age 56, G76, P3, 1 miscarriage,  HRT x 5-10 yrs, Mild Hot Flashes  Other Topics Concern   Not on file  Social History Narrative   Lives at home.   Social Determinants of Health   Financial Resource Strain: Not on file  Food Insecurity: Not on file  Transportation Needs: Not on file  Physical Activity: Not on file  Stress: Not on file  Social Connections: Not on file  Intimate Partner Violence: Not on file     Physical Exam   Vitals:   09/24/21 0230 09/24/21 0630  BP: 121/66 126/81  Pulse: 89 82  Resp: 16 16  Temp:    SpO2: 98% 97%    CONSTITUTIONAL: Chronically ill-appearing, NAD NEURO:  Alert and oriented x 3, no focal deficits EYES:  eyes equal and reactive ENT/NECK:  no LAD, no JVD CARDIO: Regular rate, well-perfused, normal S1 and S2 PULM:  CTAB no wheezing or rhonchi GI/GU:  normal bowel sounds, non-distended, non-tender MSK/SPINE:  No gross deformities, no edema SKIN:  no rash, atraumatic PSYCH:  Appropriate speech and behavior  *Additional and/or pertinent findings included in MDM below  Diagnostic and Interventional Summary    EKG Interpretation  Date/Time:    Ventricular Rate:    PR Interval:    QRS Duration:   QT Interval:    QTC Calculation:   R Axis:     Text Interpretation:         Labs Reviewed  CBC - Abnormal; Notable for the following components:      Result Value   WBC 2.2 (*)    RBC 2.66 (*)    Hemoglobin 9.8 (*)    HCT 30.4 (*)    MCV 114.3 (*)    MCH 36.8 (*)    Platelets 61 (*)    All other components within normal limits  COMPREHENSIVE METABOLIC PANEL - Abnormal; Notable for the following components:   Potassium 3.3 (*)    Calcium 8.3 (*)    Total Protein 5.3 (*)    Albumin 2.7 (*)    AST 75 (*)    ALT 55 (*)    Alkaline Phosphatase  362 (*)    All other components within normal limits  LACTIC ACID, PLASMA - Abnormal; Notable for the following components:   Lactic Acid, Venous 3.1 (*)    All other components within normal limits  COMPREHENSIVE METABOLIC PANEL - Abnormal; Notable for the following components:   Potassium 3.2 (*)    Calcium 8.1 (*)    Total Protein 5.1 (*)    Albumin 2.6 (*)    AST 73 (*)    ALT 53 (*)    Alkaline Phosphatase 348 (*)    All other components within normal limits  CBC WITH DIFFERENTIAL/PLATELET - Abnormal; Notable for the following components:   WBC 3.1 (*)    RBC 2.58 (*)    Hemoglobin 9.9 (*)    HCT 29.0 (*)    MCV 112.4 (*)    MCH 38.4 (*)    Platelets 52 (*)    All other components within normal limits  TROPONIN I (HIGH SENSITIVITY) - Abnormal; Notable for the following components:   Troponin I (High Sensitivity) 51 (*)    All other components within normal limits  TROPONIN I (HIGH SENSITIVITY) - Abnormal; Notable for the following components:   Troponin I (High Sensitivity) 104 (*)    All other components within normal limits  TROPONIN I (HIGH SENSITIVITY) - Abnormal; Notable for the following components:   Troponin I (High Sensitivity) 49 (*)    All other components within normal limits  RESP PANEL BY RT-PCR (FLU A&B, COVID) ARPGX2  CK  PROTIME-INR  LACTIC ACID, PLASMA  TSH  URINALYSIS, ROUTINE W REFLEX MICROSCOPIC  HIV ANTIBODY (ROUTINE TESTING W REFLEX)    CT HEAD WO CONTRAST (5MM)  Final Result    DG Chest 1 View  Final Result    MR BRAIN WO CONTRAST    (Results Pending)    Medications  metoprolol succinate (TOPROL-XL) 24 hr tablet 100 mg (has no administration in time range)  rosuvastatin (CRESTOR) tablet 5 mg (has no administration in time range)  irbesartan (AVAPRO) tablet 75 mg (has no administration in time range)  levothyroxine (SYNTHROID) tablet 100 mcg (100 mcg Oral Given 09/24/21 0547)  levETIRAcetam (KEPPRA) tablet 1,000 mg (1,000 mg Oral Given  09/24/21 0547)  heparin injection 5,000 Units (5,000 Units Subcutaneous Given 09/24/21 0546)  feeding supplement (BOOST / RESOURCE BREEZE) liquid 1 Container (has no administration in time range)  acetaminophen (TYLENOL) tablet 650 mg (has no administration in time range)    Or  acetaminophen (TYLENOL) suppository 650 mg (has no administration in time range)  oxyCODONE (Oxy IR/ROXICODONE) immediate release tablet 5 mg (has no administration in time range)  morphine 2 MG/ML injection 2 mg (has no administration in time range)  methocarbamol (ROBAXIN) 500 mg in dextrose 5 % 50 mL IVPB (has no administration in time range)  ondansetron (ZOFRAN) tablet 4 mg (has no administration in time range)    Or  ondansetron (ZOFRAN) injection 4 mg (has no administration in time range)  LORazepam (ATIVAN) injection 1 mg (1 mg Intravenous Given 09/23/21 2345)  sodium chloride 0.9 % bolus 1,000 mL (0 mLs Intravenous Stopped 09/24/21 0252)  potassium chloride (KLOR-CON) packet 40 mEq (40 mEq Oral Given 09/24/21 0547)     Procedures  /  Critical Care .Critical Care Performed by: Maudie Flakes, MD Authorized by: Maudie Flakes, MD   Critical care provider statement:    Critical care time (minutes):  33   Critical care was necessary to treat or prevent imminent or life-threatening deterioration of the following conditions:  CNS failure or compromise   Critical care was time spent personally by me on the following activities:  Development of treatment plan with patient or surrogate, discussions with consultants, evaluation  of patient's response to treatment, examination of patient, ordering and review of laboratory studies, ordering and review of radiographic studies, ordering and performing treatments and interventions, pulse oximetry, re-evaluation of patient's condition and review of old charts  ED Course and Medical Decision Making  I have reviewed the triage vital signs, the nursing notes, and pertinent  available records from the EMR.  Listed above are laboratory and imaging tests that I personally ordered, reviewed, and interpreted and then considered in my medical decision making (see below for details).  Metastatic cancer with known vasogenic edema of the brain here with return of seizures.  Protecting airway, continues to have some focal left-sided seizure activity.  Had a 45-second episode of tonic-clonic behavior in the emergency department, given milligram Ativan.  Patient has been resting comfortably since that time, no tremulousness.  Work-up revealing a mildly elevated lactate, troponin.  Husband explains the patient has been having great difficulty ambulating over the past 3 days due to pain.  Suspect this is due to bony metastases.  Will admit to [facility] service for continued care, suspect her oncologist will follow along in consultation.       Barth Kirks. [place] Small, [place] [email or web address]  Final Clinical Impressions(s) / ED Diagnoses     ICD-10-CM   1. Vasogenic brain edema ([place])  G93.6     2. Seizure ([place])  R56.9 DG Chest 1 View    DG Chest 1 View      ED Discharge Orders     None        Discharge Instructions Discussed with and Provided to Patient:   Discharge Instructions   None       Maudie Flakes, MD 09/24/21 [phone]

## 2021-09-24 NOTE — ED Notes (Signed)
Pt to MRI

## 2021-09-24 NOTE — TOC Progression Note (Signed)
Transition of Care [facility]) - Progression Note    Patient Details  Name: [name] Riley MRN: [id number] Date of Birth: 01-04-1951  Transition of Care [facility]) CM/SW Contact  Shade Flood, LCSW Phone Number: 09/24/2021, 3:53 PM  Clinical Narrative:     TOC following. Unable to arrange the wheelchair with [employer]. Spoke with pt's husband to update and he is agreeable to referral to [employer]. Referral made to [facility]. They will deliver tomorrow prior to dc.  Expected Discharge Plan: [place] Barriers to Discharge: Continued Medical Work up  Expected Discharge Plan and Services Expected Discharge Plan: [place] In-house Referral: Clinical Social Work Discharge Planning Services: CM Consult Post Acute Care Choice: Home Health, Durable Medical Equipment Living arrangements for the past 2 months: Single Family Home                 DME Arranged: Walker rolling DME Agency: [facility] Date DME Agency Contacted: 09/24/21   Representative spoke with at DME Agency: Caryl Pina HH Arranged: PT, OT, RN, Nurse's Aide [place] Agency: [place] ([facility])         Social Determinants of Health (SDOH) Interventions    Readmission Risk Interventions No flowsheet data found.

## 2021-09-24 NOTE — Plan of Care (Signed)

## 2021-09-24 NOTE — Progress Notes (Signed)
Patient suffers from metastatic cancer with brain lesions which impairs their ability to perform daily activities like walking in the home.  A cane or walker will not resolve  issue with performing activities of daily living, as she is suffering from poor balances.. A wheelchair will allow patient to safely perform daily activities. Patient is not able to propel themselves in the home using a standard weight wheelchair due to arm weakness and endurance. Patient can self propel in the lightweight wheelchair. Length of need 12 months .

## 2021-09-24 NOTE — ED Notes (Signed)
Breakfast tray given to pt. Pt able to swallow pills well.

## 2021-09-25 DIAGNOSIS — G936 Cerebral edema: Secondary | ICD-10-CM

## 2021-09-25 DIAGNOSIS — E876 Hypokalemia: Secondary | ICD-10-CM

## 2021-09-25 DIAGNOSIS — E782 Mixed hyperlipidemia: Secondary | ICD-10-CM

## 2021-09-25 DIAGNOSIS — E039 Hypothyroidism, unspecified: Secondary | ICD-10-CM

## 2021-09-25 DIAGNOSIS — I10 Essential (primary) hypertension: Secondary | ICD-10-CM

## 2021-09-25 LAB — URINALYSIS, MICROSCOPIC (REFLEX)

## 2021-09-25 LAB — URINALYSIS, ROUTINE W REFLEX MICROSCOPIC
Bilirubin Urine: NEGATIVE
Glucose, UA: NEGATIVE mg/dL
Ketones, ur: NEGATIVE mg/dL
Leukocytes,Ua: NEGATIVE
Nitrite: NEGATIVE
Protein, ur: NEGATIVE mg/dL
Specific Gravity, Urine: 1.01 (ref 1.005–1.030)
pH: 6.5 (ref 5.0–8.0)

## 2021-09-25 MED ORDER — PANTOPRAZOLE SODIUM 40 MG PO TBEC: 40.0000 mg | DELAYED_RELEASE_TABLET | Freq: Every day | ORAL | 1 refills | Status: AC

## 2021-09-25 MED ORDER — LEVETIRACETAM 750 MG PO TABS: 1500.0000 mg | ORAL_TABLET | Freq: Two times a day (BID) | ORAL | 1 refills | Status: DC

## 2021-09-25 MED ORDER — DEXAMETHASONE 4 MG PO TABS: 4.0000 mg | ORAL_TABLET | Freq: Two times a day (BID) | ORAL | 0 refills | Status: DC

## 2021-09-25 MED ORDER — TEMAZEPAM 7.5 MG PO CAPS: 7.5000 mg | ORAL_CAPSULE | Freq: Every evening | ORAL | 0 refills | Status: DC | PRN

## 2021-09-25 NOTE — Progress Notes (Signed)
Patient discharged home today, transported home by spouse. Discharge summary went over with patient and spouse, both verbalized understanding. Belongings sent home with patient.

## 2021-09-25 NOTE — Discharge Summary (Signed)
Physician Discharge Summary  [name] Riley OAC:[id number] DOB: 1950/11/19 DOA: 09/23/2021  PCP: [name], MD  Admit date: 09/23/2021 Discharge date: 09/25/2021  Time spent: 35 minutes  Recommendations for Outpatient Follow-up:  Repeat basic metabolic panel to follow to lites and renal function Assess patient response to adjusted dose of antiepileptic drugs. Make sure patient has follow-up as instructed with her oncology/neurology oncology services.  Discharge Diagnoses:  Principal Problem:   Seizures ([place]) Active Problems:   Hypothyroidism   Mixed hyperlipidemia   Essential hypertension   Hypertension   Hypokalemia   Seizure ([place])   Vasogenic brain edema ([facility])   Discharge Condition: Stable and improved.  Discharged home with instruction to follow-up with neurology allergy service; she will also see PCP in about 2 weeks.  CODE STATUS: DNR  Diet recommendation: Heart healthy diet.  Filed Weights   09/23/21 2335  Weight: 60 kg    History of present illness:  Per H&P written by Dr. Clearence Ped on 09/24/21 [name] Riley  is a 70 y.o. female, with history of metastatic breast cancer, coronary artery disease, heart attack, hyperlipidemia, hypothyroidism, history of chemo and radiation, currently on chemo, presents to the ED with a chief complaint of seizures.  Patient reports she remembers the entire thing.  She had shaking and pain in her neck.  She reports that the shaking started in her left upper extremity and then proceeded to full body.  She had no loss of consciousness.  This is similar to the seizure she had 3-4 weeks ago.  She reports preceding symptoms including a headache that starts on the top right of her head and radiates around to her left cheekbone.  She also has stomach sensation that feels like waves.  Patient reports that the symptoms only happen prior to his seizure.  Patient remembers the ride to the hospital.  She remembers feeling like she was going to fall  in the bathroom floor at home.  She did not fall.  Patient ambulates on her own without a walker or wheelchair.  Patient reports her last chemo was November 11, and her next is December 12.  She has felt fatigued more than usual per her report.  She also reports 2 days of decreased hearing in the left ear.  It sounds like low volume, but does not sound muffled.  Patient reports swelling in her feet and legs.  She reports she takes Lasix every day and has been compliant with it.  Patient reports she is also been compliant with her Keppra.  Her Keppra was recently increased from 500 mg twice daily to 2000 mg twice daily approximately 2 weeks ago.  Patient has no other complaints at this time.   It is noted the patient was given Versed in EMS on the way here 2.5 mg.   Patient does not smoke, does not drink, does not use illicit drugs.  Patient is vaccinated for COVID.  Patient is DNR.   In the ED Temp 98, heart rate 89-130, respiratory rate 15-24, blood pressure 121/66, satting 89% Leukopenia with a white blood cell count of 2.2, hemoglobin 9.8 Chemistry panel reveals a hypokalemia at 3.3, alk phos 362, albumin 2.7, AST 75, ALT 55, troponin initially 51, then 104 and lactic acidosis 3.1 Chest x-ray shows diffuse sclerotic metastatic disease.  No intrathoracic abnormality noted.   CT head shows stable vasogenic edema throughout the right frontal and parietal lobe consistent with the known history of metastatic disease.  Changes better visualized on recent  MRI Admission requested for seizures.  Hospital Course:  1-seizure -Secondary to brain metastases -CT scan and MRI results review with neurology oncology; with recommendations to adjust Keppra to 1500 mg twice a day and to use Decadron 4 mg twice a day. -No further seizure activities appreciated overnight -Patient reported improvement in her left-sided weakness and balance. -Patient has been discharged home with home health services and DME  equipment to facilitate her care. -Outpatient follow-up with oncology/neurology oncology as recommended.  2-lactic acidosis -Resolved after fluid resuscitation -Most likely associated with seizure activity.  3-hypothyroidism -Continue Synthroid.  4-hypokalemia -Electrolytes repleted and within normal limits at discharge -Repeat basic metabolic panel to follow electrolytes trend.  5-moderate protein calorie malnutrition -Patient advised to increase oral intake and to use feeding supplements (Ensure). -Advised to maintain adequate hydration.  6-hypertension -Continue home antihypertensive agents -Vital signs stable at discharge.  7-hyperlipidemia -Continue statins.  8-weakness/deconditioning -Patient seen by physical therapy with recommendation for a skilled nursing facility; after discussing with patient and family they decided to go home with home health services instead. -Home health PT, home health RN, home health OT and aide arranged at discharge.  9-GI prophylaxis/insomnia -Patient discharged on PPI and as needed Restoril.   Procedures: See below for x-ray reports.  Consultations: Neurology and oncology service curbside.  Discharge Exam: Vitals:   09/25/21 0631 09/25/21 0825  BP: 131/79 130/80  Pulse: 82 79  Resp: 19   Temp: 98.3 F (36.8 C)   SpO2: 96%     General: Afebrile, no chest pain, no nausea, no vomiting, no shortness of breath.  Patient reports improvement in her left-sided weakness and had not experienced any further seizure activity. Cardiovascular: S1 and S2, no rubs, no gallops, no JVD Respiratory: Good saturation on room air; no using accessory muscles. Abdomen: Soft, nontender, nondistended, positive bowel sounds Extremities: No cyanosis or clubbing.  Trace edema appreciated bilaterally. Neurology exam: Left-sided weakness appreciated affecting upper and lower extremities (son improvement in her strength demonstrated overnight); patient also  expressed less balances issues; no other focal deficits.  No seizure activities.  Discharge Instructions   Discharge Instructions     Discharge instructions   Complete by: As directed    Take medications as prescribed Maintain adequate hydration Follow-up with oncology service as previously instructed Arrange follow-up with PCP in 2 weeks Follow-up with Dr. Mickeal Skinner (neurology oncology) office will contact you with appointment details.   Increase activity slowly   Complete by: As directed       Allergies as of 09/25/2021       Reactions   Cantaloupe Extract Allergy Skin Test Shortness Of Breath   Contrast Media [iodinated Diagnostic Agents] Shortness Of Breath, Rash   Pravastatin Other (See Comments)   Legs hurt   Zosyn [piperacillin Sod-tazobactam So] Rash, Other (See Comments)   Temperature increase, facial flushing   Latex Rash   Redness, itch        Medication List     STOP taking these medications    predniSONE 50 MG tablet Commonly known as: DELTASONE       TAKE these medications    acetaminophen 500 MG tablet Commonly known as: TYLENOL Take 1,000 mg by mouth every 6 (six) hours as needed for moderate pain.   cholecalciferol 1000 units tablet Commonly known as: VITAMIN D Take 1 tablet (1,000 Units total) by mouth daily.   dexamethasone 4 MG tablet Commonly known as: DECADRON Take 1 tablet (4 mg total) by mouth 2 (two) times  daily with a meal. What changed:  medication strength how much to take   DSS 100 MG Caps Take by mouth.   furosemide 20 MG tablet Commonly known as: LASIX TAKE ONE (1) TABLET BY MOUTH EVERY DAY   letrozole 2.5 MG tablet Commonly known as: FEMARA TAKE ONE (1) TABLET EACH DAY   levETIRAcetam 750 MG tablet Commonly known as: Keppra Take 2 tablets (1,500 mg total) by mouth 2 (two) times daily. What changed:  medication strength how much to take   levothyroxine 100 MCG tablet Commonly known as: SYNTHROID TAKE ONE  TABLET EACH MORNING BEFORE BREAKFAST   lidocaine-prilocaine cream Commonly known as: EMLA APPLY TOPICALLY AS NEEDED FOR PORT ACCESS   metoprolol succinate 100 MG 24 hr tablet Commonly known as: TOPROL-XL Take with or immediately following a meal. What changed:  how much to take how to take this when to take this   multivitamin with minerals tablet Take 1 tablet by mouth daily.   ondansetron 4 MG disintegrating tablet Commonly known as: ZOFRAN-ODT TAKE 1 TABLET EVERY 6 HOURS AS NEEDED FOR NAUSEA AND VOMITING   pantoprazole 40 MG tablet Commonly known as: Protonix Take 1 tablet (40 mg total) by mouth daily.   potassium chloride SA 20 MEQ tablet Commonly known as: KLOR-CON M Take 1 tablet (20 mEq total) by mouth 2 (two) times daily for 7 days.   rosuvastatin 5 MG tablet Commonly known as: CRESTOR Take 1 tablet (5 mg total) by mouth daily. (new directions)   temazepam 7.5 MG capsule Commonly known as: Restoril Take 1 capsule (7.5 mg total) by mouth at bedtime as needed for sleep.   triamcinolone 0.025 % ointment Commonly known as: KENALOG Apply 1 application topically 2 (two) times daily.   valsartan 80 MG tablet Commonly known as: DIOVAN TAKE ONE (1) TABLET EACH DAY   Verzenio 50 MG tablet Generic drug: abemaciclib Take 1 tablet (50 mg total) by mouth 2 (two) times daily.   VITAMIN C PO Take by mouth.               Durable Medical Equipment  (From admission, onward)           Start     Ordered   09/24/21 1323  For home use only DME lightweight manual wheelchair with seat cushion  Once       Comments: Patient suffers from metastatic cancer with brain lesions which impairs their ability to perform daily activities like walking in the home.  A cane or walker will not resolve  issue with performing activities of daily living, as she is suffering from poor balances.. A wheelchair will allow patient to safely perform daily activities. Patient is not able  to propel themselves in the home using a standard weight wheelchair due to arm weakness and endurance. Patient can self propel in the lightweight wheelchair. Length of need 12 months . Accessories: elevating leg rests (ELRs), wheel locks, extensions and anti-tippers.   09/24/21 1331           Allergies  Allergen Reactions   Cantaloupe Extract Allergy Skin Test Shortness Of Breath   Contrast Media [Iodinated Diagnostic Agents] Shortness Of Breath and Rash   Pravastatin Other (See Comments)    Legs hurt   Zosyn [Piperacillin Sod-Tazobactam So] Rash and Other (See Comments)    Temperature increase, facial flushing   Latex Rash    Redness, itch     Follow-up Information     [name], MD. Schedule an  appointment as soon as possible for a visit in 2 week(s).   Specialty: Family Medicine Contact information: [place] [address] [phone]         Minus Breeding, MD .   Specialty: Cardiology Contact information: [address] [place] [address] [phone]                  The results of significant diagnostics from this hospitalization (including imaging, microbiology, ancillary and laboratory) are listed below for reference.    Significant Diagnostic Studies: DG Chest 1 View  Result Date: 09/24/2021 CLINICAL DATA:  Recent seizure-like activity, initial encounter EXAM: CHEST  1 VIEW COMPARISON:  09/15/2021 FINDINGS: Left chest wall port is again noted and stable. Cardiac shadow is within normal limits. The lungs are well aerated bilaterally. No focal infiltrate is seen. Sclerotic changes are noted in the bony structures consistent with metastatic disease. IMPRESSION: Diffuse sclerotic metastatic disease. No intrathoracic abnormality is noted. Electronically Signed   By: Inez Catalina M.D.   On: 09/24/2021 00:35   CT HEAD WO CONTRAST (5MM)  Result Date: 09/24/2021 CLINICAL DATA:  History of breast carcinoma with known  metastatic disease. EXAM: CT HEAD WITHOUT CONTRAST TECHNIQUE: Contiguous axial images were obtained from the base of the skull through the vertex without intravenous contrast. COMPARISON:  08/28/2021 FINDINGS: Brain: Persistent vasogenic edema is noted within the right frontal and parietal lobes similar to that seen on the prior exam. This is again consistent with the known history of metastatic disease. No findings to suggest acute hemorrhage or acute infarction are noted. Vascular: No hyperdense vessel or unexpected calcification. Skull: Diffuse bony metastatic disease is noted consistent with the given clinical history. Sinuses/Orbits: No acute finding. Other: None. IMPRESSION: Stable vasogenic edema throughout the right frontal and parietal lobe consistent with the known history of metastatic disease. These changes were better visualized on recent MRI examination. No new focal abnormality is noted. Findings consistent with diffuse bony metastatic disease. Electronically Signed   By: Inez Catalina M.D.   On: 09/24/2021 00:41   CT Head Wo Contrast  Result Date: 08/28/2021 CLINICAL DATA:  70 year old female with history of nontraumatic seizure. EXAM: CT HEAD WITHOUT CONTRAST TECHNIQUE: Contiguous axial images were obtained from the base of the skull through the vertex without intravenous contrast. COMPARISON:  Head CT 10/28/2020. FINDINGS: Brain: Again noted are extensive areas of low attenuation throughout the right frontal lobe and anterior right parietal lobe, decreased compared to prior head CT 10/28/2020, however, markedly increased when compared to more recent brain MRI 06/02/2021, likely reflective of improving vasogenic edema in the setting of known brain metastasis. There are some cortical and subcortical calcifications in these regions which have increased compared to the prior study. There is little if any mass effect associated with this edema at this time. Patient is poorly positioned in the  scanner, but if there is any midline shift it is 1-2 mm from right to left. There is also some low attenuation in the anterior aspect of the right temporal lobe, likely reflective of additional areas of vasogenic edema. No evidence of hydrocephalus, extra-axial collection or mass lesion/mass effect. Vascular: No hyperdense vessel or unexpected calcification. Skull: Widespread multifocal sclerotic lesions are noted throughout the visualized skull and upper cervical spine, compatible with extensive treated metastatic disease to the bones. Sinuses/Orbits: No acute finding. Other: None. IMPRESSION: 1. When compared to most recent brain MRI 06/02/2021, there is increasing vasogenic edema throughout the right frontal, anterior  right parietal and right temporal lobes, likely secondary to underlying brain metastases. This could suggest progression of disease. Correlation with brain MRI with and without IV gadolinium should be considered if clinically appropriate. 2. Widespread metastatic disease to the skull and cervical spine redemonstrated. Electronically Signed   By: Vinnie Langton M.D.   On: 08/28/2021 07:06   MR BRAIN WO CONTRAST  Result Date: 09/24/2021 CLINICAL DATA:  Acute neuro deficit. Suspect stroke. Seizure. History of metastatic breast cancer. EXAM: MRI HEAD WITHOUT CONTRAST TECHNIQUE: Multiplanar, multiecho pulse sequences of the brain and surrounding structures were obtained without intravenous contrast. COMPARISON:  CT head 09/24/2021.  MRI head 09/03/2021 FINDINGS: Brain: Progressive vasogenic edema in the right frontal parietal lobe over the convexity. Associated mass lesions in the right frontal parietal lobe again noted. These showed enhancement on the prior study. Contrast not administered today. These lesions show susceptibility, similar to the prior study. There is also dural thickening on the right due to tumor invasion likely from the calvarium. Small lesion in the right anterior temporal lobe  best seen on axial FLAIR image 14. This enhanced on the prior study and is unchanged. Ventricle size normal.  No midline shift.  No acute infarct. Vascular: Normal arterial flow voids Skull and upper cervical spine: Extensive metastatic disease to the calvarium primarily on the right side. Diffuse metastatic disease in the cervical spine. Sinuses/Orbits: Mucosal edema paranasal sinuses.  Negative orbit Other: None IMPRESSION: 1. Progression of vasogenic edema in the right frontal parietal lobe. Several associated lesions are present in the right frontal parietal lobe which enhanced on the prior study. These show susceptibility. There is diffuse metastatic disease to the right side of the skull with dural invasion 2. Small lesion right anterior temporal lobe, stable 3. 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 Electronically Signed   By: Franchot Gallo M.D.   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 CONTRAST:  71m GADAVIST GADOBUTROL 1 MMOL/ML IV SOLN COMPARISON:  06/02/2021 FINDINGS: Brain: Increase in size of irregular enhancement within the right frontal and parietal lobes underlying and contiguous with dural based enhancement. The more anterior region abutting the precentral sulcus measures approximately 2 x 1.6 cm (remeasured previously as 1.5 x 1.2 cm). The more posterior region centered within the postcentral gyrus with some precentral involvement superiorly measures 1.3 x 1.4 cm (remeasured previously as 0.7 x 0.9 cm). There is substantial increase in surrounding edema in the frontoparietal white matter  without significant mass effect. There is unchanged irregular dural thickening more inferior to this region (series 1200, image 93). Increased size of dural based enhancement along the anterior right temporal convexity now measuring 6 mm (previously 4 mm) on series 1200, image 170. Vascular: Major vessel flow voids at the skull base are preserved. Skull and upper cervical spine: Abnormal marrow signal reflecting known widespread sclerotic osseous metastatic disease. Sinuses/Orbits: Minor mucosal thickening.  Orbits are unremarkable. Other: Sella is unremarkable.  Mastoid air cells are clear. IMPRESSION: Increase in irregular parenchymal enhancement within the right frontoparietal lobes with substantially increased edema as described. Increase in size of subcentimeter dural-based enhancement along the anterior right temporal convexity. Otherwise stable irregular dural  thickening along the right convexity. Parenchymal findings may reflect recurrence of radiation necrosis or invasion of parenchyma by dural disease. Given presumed prior radiation necrosis responsive to therapy and that the dural-based disease has not significantly progressed, invasion of parenchyma seems less likely. PET could be considered for differentiation. MR perfusion would likely be less helpful due to irregularity of enhancing regions and susceptibility in this region. Reviewed at brain conference 09/06/2021. Electronically Signed   By: Macy Mis M.D.   On: 09/06/2021 08:51   CT CHEST ABDOMEN PELVIS W CONTRAST  Result Date: 09/15/2021 CLINICAL DATA:  Metastatic breast cancer.  Restaging. EXAM: CT CHEST, ABDOMEN, AND PELVIS WITH CONTRAST TECHNIQUE: Multidetector CT imaging of the chest, abdomen and pelvis was performed following the standard protocol during bolus administration of intravenous contrast. CONTRAST:  45m OMNIPAQUE IOHEXOL 350 MG/ML SOLN COMPARISON:  Chest abdomen pelvis CT 04/02/2021 FINDINGS: CT CHEST FINDINGS  Cardiovascular: The heart size is normal. No substantial pericardial effusion. No thoracic aortic aneurysm. Left Port-A-Cath tip is positioned in the mid right atrium. Mediastinum/Nodes: No mediastinal lymphadenopathy. There is no hilar lymphadenopathy. Tiny hiatal hernia. The esophagus has normal imaging features. There is no axillary lymphadenopathy. Lungs/Pleura: No suspicious pulmonary nodule or mass. Stable subpleural scarring left lung apex. Subpleural reticulation anterior left upper lobe suggest prior radiation treatment. No focal airspace consolidation. There is no evidence of pleural effusion. Musculoskeletal: Sclerotic bone metastases again noted, similar to prior. 15 mm sternal lesion on 50/4 was 15 mm previously (remeasured). CT ABDOMEN PELVIS FINDINGS Hepatobiliary: Liver metastases better demonstrated on today's postcontrast study. 3.4 cm lesion in the lateral right liver on 64/2 was measured previously at 2.1 cm. 2.1 cm right hepatic lesion on 60/2 was measured previously at 1.3 cm. 1.6 cm lesion medial right liver on 67/2 was very subtle on the prior study measuring about 1.1 cm (remeasured). This lesion was not visible on the postcontrast study from 11/26/2020. Small ill-defined hypoenhancing lesion in the left liver measuring 14 mm on 59/2 was not visible on the prior noncontrast study or a postcontrast exam from 11/26/2020. Tiny calcified gallstone evident. No intrahepatic or extrahepatic biliary dilation. Pancreas: No focal mass lesion. No dilatation of the main duct. No intraparenchymal cyst. No peripancreatic edema. Spleen: No splenomegaly. No focal mass lesion. Adrenals/Urinary Tract: No adrenal nodule or mass. Cortical scarring noted both kidneys with several left renal cysts again noted. Right ureter unremarkable. Mild fullness of the left ureter is nonspecific. Bladder is moderately distended. Stomach/Bowel: Stomach is decompressed. Duodenum is normally positioned as is the ligament of  Treitz. No small bowel wall thickening. No small bowel dilatation. The terminal ileum is normal. The appendix is not well visualized, but there is no edema or inflammation in the region of the cecum. No gross colonic mass. No colonic wall thickening. Large stool volume evident. Vascular/Lymphatic: No abdominal aortic aneurysm. No abdominal lymphadenopathy No pelvic sidewall lymphadenopathy. Reproductive:  There is no adnexal mass. Other: No intraperitoneal free fluid. Musculoskeletal: Stable widespread sclerotic bone metastases again noted. 2.9 cm sclerotic S1 lesion was 3.0 cm previously (remeasured). IMPRESSION: 1. Interval progression of hepatic metastases better demonstrated on today's postcontrast study than on the most recent comparison study. Several lesions on today's study were not visible on the previous noncontrast exam nor on the postcontrast study from 11/26/2020 suggesting new disease. 2. Similar appearance of widespread sclerotic bone metastases. 3. Cholelithiasis. 4. Large stool volume. Imaging features could be compatible with constipation in the appropriate clinical setting. 5. Mild fullness of  the left ureter is nonspecific given the bladder distension. Attention on follow-up recommended. 6. Aortic Atherosclerosis (ICD10-I70.0). Electronically Signed   By: Misty Stanley M.D.   On: 09/15/2021 97:02   NM PET Metabolic Brain  Result Date: 09/15/2021 CLINICAL DATA:  Dementia. Frontotemporal dementia versus Alzheimer's type dementia EXAM: NM PET METABOLIC BRAIN TECHNIQUE: 9.7 mCi F-18 FDG was injected intravenously. Full-ring PET imaging was performed from the vertex to skull base. CT data was obtained and used for attenuation correction and anatomic localization. PET data set fused with postcontrast T1 weighted MRI data set from 09/03/2021. FASTING BLOOD GLUCOSE:  Value: 112 mg/dl COMPARISON:  Brain MRI 09/03/2021, 06/02/2021 FINDINGS: Two regions of post-contrast T1 weighted MRI enhancement in  the high RIGHT frontal lobe have metabolic activity similar to adjacent cortical gyri. No focal activity greater than background gyral activity to clearly suggest brain tumor recurrence. Along the medial margin of the anterior lesion, there is clear hypometabolic activity suggesting tumor necrosis. IMPRESSION: 1. No convincing evidence brain tumor recurrence with metabolic activity of the enhancing high RIGHT frontal lesions similar to adjacent cortical gyri. 2. The more anterior expanding RIGHT frontal lesion does have hypometabolism along the medial border suggesting tumor necrosis. Electronically Signed   By: Suzy Bouchard M.D.   On: 09/15/2021 09:20    Microbiology: Recent Results (from the past 240 hour(s))  Resp Panel by RT-PCR (Flu A&B, Covid) Nasopharyngeal Swab     Status: None   Collection Time: 09/24/21  3:11 AM   Specimen: Nasopharyngeal Swab; Nasopharyngeal(NP) swabs in vial transport medium  Result Value Ref Range Status   SARS Coronavirus 2 by RT PCR NEGATIVE NEGATIVE Final    Comment: (NOTE) SARS-CoV-2 target nucleic acids are NOT DETECTED.  The SARS-CoV-2 RNA is generally detectable in upper respiratory specimens during the acute phase of infection. The lowest concentration of SARS-CoV-2 viral copies this assay can detect is 138 copies/mL. A negative result does not preclude SARS-Cov-2 infection and should not be used as the sole basis for treatment or other patient management decisions. A negative result may occur with  improper specimen collection/handling, submission of specimen other than nasopharyngeal swab, presence of viral mutation(s) within the areas targeted by this assay, and inadequate number of viral copies(<138 copies/mL). A negative result must be combined with clinical observations, patient history, and epidemiological information. The expected result is Negative.  Fact Sheet for Patients:  EntrepreneurPulse.com.au  Fact Sheet for  Healthcare Providers:  IncredibleEmployment.be  This test is no t yet approved or cleared by the [place] FDA and  has been authorized for detection and/or diagnosis of SARS-CoV-2 by FDA under an Emergency Use Authorization (EUA). This EUA will remain  in effect (meaning this test can be used) for the duration of the COVID-19 declaration under Section 564(b)(1) of the Act, 21 U.S.C.section 360bbb-3(b)(1), unless the authorization is terminated  or revoked sooner.       Influenza A by PCR NEGATIVE NEGATIVE Final   Influenza B by PCR NEGATIVE NEGATIVE Final    Comment: (NOTE) The Xpert Xpress SARS-CoV-2/FLU/RSV plus assay is intended as an aid in the diagnosis of influenza from Nasopharyngeal swab specimens and should not be used as a sole basis for treatment. Nasal washings and aspirates are unacceptable for Xpert Xpress SARS-CoV-2/FLU/RSV testing.  Fact Sheet for Patients: EntrepreneurPulse.com.au  Fact Sheet for Healthcare Providers: IncredibleEmployment.be  This test is not yet approved or cleared by the [place] FDA and has been authorized for detection and/or diagnosis of SARS-CoV-2  by FDA under an Emergency Use Authorization (EUA). This EUA will remain in effect (meaning this test can be used) for the duration of the COVID-19 declaration under Section 564(b)(1) of the Act, 21 U.S.C. section 360bbb-3(b)(1), unless the authorization is terminated or revoked.  Performed at [facility], [address] [place], [place] [address]      Labs: Basic Metabolic Panel: Recent Labs  Lab 09/24/21 0028 09/24/21 0350  NA 139 137  K 3.3* 3.2*  CL 105 104  CO2 27 27  GLUCOSE 86 88  BUN 20 18  CREATININE 0.64 0.56  CALCIUM 8.3* 8.1*   Liver Function Tests: Recent Labs  Lab 09/24/21 0028 09/24/21 0350  AST 75* 73*  ALT 55* 53*  ALKPHOS 362* 348*  BILITOT 0.9 0.9  PROT 5.3* 5.1*  ALBUMIN 2.7* 2.6*    CBC: Recent Labs  Lab 09/24/21 0028 09/24/21 0350  WBC 2.2* 3.1*  NEUTROABS  --  1.7  HGB 9.8* 9.9*  HCT 30.4* 29.0*  MCV 114.3* 112.4*  PLT 61* 52*   Cardiac Enzymes: Recent Labs  Lab 09/24/21 0028  CKTOTAL 44    Signed:  Barton Dubois MD.  [facility] 09/25/2021, 11:40 AM

## 2021-09-26 NOTE — Progress Notes (Signed)
Patient Care Team: [name], MD as PCP - General (Family Medicine) Minus Breeding, MD as PCP - Cardiology (Cardiology) Nicholas Lose, MD as Consulting Physician (Hematology and Oncology)  DIAGNOSIS:    ICD-10-CM   1. Metastatic breast cancer ([place])  C50.919     2. Brain metastasis ([place])  C79.31 PHYSICIAN COMMUNICATION ORDER    CBC with Differential ([place])    CMP ([place] only)    CBC with Differential    Comprehensive metabolic panel    ondansetron (ZOFRAN) 8 MG tablet    dexamethasone (DECADRON) 4 MG tablet    prochlorperazine (COMPAZINE) 10 MG tablet    lidocaine-prilocaine (EMLA) cream    3. Malignant neoplasm of upper-outer quadrant of left breast in female, estrogen receptor positive ([place])  C50.412 PHYSICIAN COMMUNICATION ORDER   Z17.0 CBC with Differential ([place])    CMP ([place] only)    CBC with Differential    Comprehensive metabolic panel    ondansetron (ZOFRAN) 8 MG tablet    dexamethasone (DECADRON) 4 MG tablet    prochlorperazine (COMPAZINE) 10 MG tablet    lidocaine-prilocaine (EMLA) cream      SUMMARY OF ONCOLOGIC HISTORY: Oncology History  Breast cancer of upper-outer quadrant of left female breast ([place])  11/14/2011 Surgery   Bilateral mastectomy, prophylactic on the right, left breast IDC 3/18 lymph nodes positive with extracapsular extension ER 89%, PR 81%, HER-2 negative, Ki-67 79% T2 N1 A. stage IIB   12/13/2011 - 06/28/2012 Chemotherapy   4 cycles of FEC followed by 4 cycles of Taxotere   07/17/2012 - 08/22/2012 Radiation Therapy   Adjuvant radiation therapy   08/22/2012 - 03/16/2016 Anti-estrogen oral therapy   Arimidex 1 mg daily   03/16/2016 Relapse/Recurrence   Subcutaneous nodule excision left chest: Infiltrating carcinoma breast primary, ER positive, PR negative   03/29/2016 Imaging   CT CAP and bone scan: Lytic lesions T8 vertebral, T1 posterior element, subcutaneous nodule left lateral chest wall,  nonspecific lung nodules; Bone scan: Mets to kull, left humerus, left eighth rib, T7/T8, sternum, left acetabulum   04/28/2016 - 06/17/2017 Chemotherapy   Herceptin, lapatinib, Faslodex, Zometa every 4 weeks, lapatinib discontinued in September 2018 due to elevation of LFTs   06/22/2017 Relapse/Recurrence   Surgical excision:Soft tissue mass left lateral chest wall primary breast cancer, soft tissue mass left medial chest wall breast cancer, tumor is within the dermis extending to the subcutaneous adipose tissue and involves portions of skeletal muscle   06/22/2017 Cancer Staging   Staging form: Breast, AJCC 7th Edition - Pathologic stage from 06/22/2017: Stage IV (TX, NX, M1) - Signed by Nicholas Lose, MD on 12/27/2019    08/2017 - 02/16/2018 Chemotherapy   Faslodex with Herceptin and Perjeta along with Zometa every 4 weeks   03/02/2018 - 05/25/2018 Chemotherapy   Kadcyla   05/11/2018 Imaging   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 Associated vasogenic edema within the underlying right cerebral hemisphere without significant midline shift. Signal abnormality throughout the visualized bone marrow, compatible with osseous metastatic disease.    06/04/2018 Imaging   CT CAP: Right lower lobe lung nodule 7 mm (was 5 mm); multiple bone metastases throughout the spine and ribs sternum scapula and humerus, slightly increased lower thoracic mets, right renal lesion 2.5 cm (was 1.2 cm) right femur met increased from 2.1 cm to 2.9 cm   06/15/2018 -  Anti-estrogen oral therapy   Abemaciclib, Herceptin, letrozole, Xgeva   06/07/2019 Imaging   Progression of  brain metastases,2 discrete dural-based lesions involving the posterior right frontal lobe. The more anterior lesion now measures 16.5 x 17.5 x 14 mm. The posterior lesion now measures 9.5 x 13 x 20 mm.  Vasogenic edema of the frontal and parietal lobes   06/21/2019 - 06/27/2019 Radiation Therapy   SRS to the brain   07/19/2020  Relapse/Recurrence   Left mastectomy scar nodule: Biopsy invasive carcinoma ER 60%, PR 0%, HER-2 negative by [facility]   07/29/2020 Surgery   Soft tissue mass left chest wall excision: No evidence of malignancy.   11/27/2020 - 02/05/2021 Chemotherapy      Patient is on Antibody Plan: BREAST ADO-TRASTUZUMAB EMTANSINE ([place]) Q21D     01/15/2021 - 08/27/2021 Chemotherapy   Patient is on Treatment Plan : BREAST ADO-Trastuzumab Emtansine (Kad[place]la) q21d     10/04/2021 -  Chemotherapy   Patient is on Treatment Plan : BREAST METASTATIC fam-trastuzumab deruxtecan-nxki (Enhertu) q21d     Metastatic breast cancer ([place])  06/29/2017 Initial Diagnosis   Metastatic breast cancer ([place])   06/15/2018 - 12/25/2020 Chemotherapy    Patient is on Treatment Plan: BRAIN GBM BEVACIZUMAB 14D X 6 CYCLES       01/15/2021 - 08/27/2021 Chemotherapy   Patient is on Treatment Plan : BREAST ADO-Trastuzumab Emtansine (Kadcyla) q21d     Brain metastasis ([place])  07/13/2018 Initial Diagnosis   Brain metastasis ([place])   11/27/2020 - 02/05/2021 Chemotherapy      Patient is on Antibody Plan: BREAST ADO-TRASTUZUMAB EMTANSINE ([place]) Q21D     10/04/2021 -  Chemotherapy   Patient is on Treatment Plan : BREAST METASTATIC fam-trastuzumab deruxtecan-nxki (Enhertu) q21d       CHIEF COMPLIANT: Recent hospitalization for seizures  INTERVAL HISTORY: [name] Riley is a 70 y.o. with above-mentioned history of metastatic breast cancer currently on abemaciclib with letrozole along with Herceptin, Avastin, and Xgeva. She presents to the clinic today to discuss her treatment plan.  She was hospitalized for seizures and she had extensive work-up including a PET brain as well as a brain MRI which showed brain edema along with the progression of brain metastases.  The CT chest abdomen pelvis done at the end of November also showed progression of liver metastases.  She is here today accompanied by her husband.  Her husband reports that her  left arm is extremely weak.  She also requires a wheelchair for ambulation.  She is slowly starting to get better.  ALLERGIES:  is allergic to cantaloupe extract allergy skin test, contrast media [iodinated diagnostic agents], pravastatin, zosyn [piperacillin sod-tazobactam so], and latex.  MEDICATIONS:  Current Outpatient Medications  Medication Sig Dispense Refill   abemaciclib (VERZENIO) 50 MG tablet Take 1 tablet (50 mg total) by mouth 2 (two) times daily. 56 tablet 3   acetaminophen (TYLENOL) 500 MG tablet Take 1,000 mg by mouth every 6 (six) hours as needed for moderate pain.     Ascorbic Acid (VITAMIN C PO) Take by mouth.     cholecalciferol (VITAMIN D) 1000 units tablet Take 1 tablet (1,000 Units total) by mouth daily.     dexamethasone (DECADRON) 4 MG tablet Take 1 tablet (4 mg total) by mouth 2 (two) times daily with a meal. 60 tablet 0   dexamethasone (DECADRON) 4 MG tablet Take 1 tablet (4 mg total) by mouth daily. Start the day after chemotherapy for 2 days. 20 tablet 0   Docusate Sodium (DSS) 100 MG CAPS Take by mouth.     furosemide (LASIX) 20 MG tablet  TAKE ONE (1) TABLET BY MOUTH EVERY DAY 30 tablet 1   letrozole (FEMARA) 2.5 MG tablet TAKE ONE (1) TABLET EACH DAY 90 tablet 3   levETIRAcetam (KEPPRA) 750 MG tablet Take 2 tablets (1,500 mg total) by mouth 2 (two) times daily. 120 tablet 1   levothyroxine (SYNTHROID) 100 MCG tablet TAKE ONE TABLET EACH MORNING BEFORE BREAKFAST 90 tablet 3   lidocaine-prilocaine (EMLA) cream APPLY TOPICALLY AS NEEDED FOR PORT ACCESS 30 g 3   lidocaine-prilocaine (EMLA) cream Apply to affected area once 30 g 3   metoprolol succinate (TOPROL-XL) 100 MG 24 hr tablet Take with or immediately following a meal. (Patient taking differently: Take 100 mg by mouth daily. Take with or immediately following a meal.) 90 tablet 3   Multiple Vitamins-Minerals (MULTIVITAMIN WITH MINERALS) tablet Take 1 tablet by mouth daily.     ondansetron (ZOFRAN) 8 MG tablet  Take 1 tablet (8 mg total) by mouth 2 (two) times daily as needed for refractory nausea / vomiting. Start on day 3 after chemo. 30 tablet 1   ondansetron (ZOFRAN-ODT) 4 MG disintegrating tablet TAKE 1 TABLET EVERY 6 HOURS AS NEEDED FOR NAUSEA AND VOMITING 30 tablet 3   pantoprazole (PROTONIX) 40 MG tablet Take 1 tablet (40 mg total) by mouth daily. 30 tablet 1   potassium chloride SA (KLOR-CON) 20 MEQ tablet Take 1 tablet (20 mEq total) by mouth 2 (two) times daily for 7 days. 14 tablet 0   prochlorperazine (COMPAZINE) 10 MG tablet Take 1 tablet (10 mg total) by mouth every 6 (six) hours as needed (Nausea or vomiting). 30 tablet 1   rosuvastatin (CRESTOR) 5 MG tablet Take 1 tablet (5 mg total) by mouth daily. (new directions) (Patient not taking: Reported on 09/27/2021) 90 tablet 3   temazepam (RESTORIL) 7.5 MG capsule Take 1 capsule (7.5 mg total) by mouth at bedtime as needed for sleep. 30 capsule 0   triamcinolone (KENALOG) 0.025 % ointment Apply 1 application topically 2 (two) times daily. 80 g 1   valsartan (DIOVAN) 80 MG tablet TAKE ONE (1) TABLET EACH DAY 90 tablet 3   No current facility-administered medications for this visit.   Facility-Administered Medications Ordered in Other Visits  Medication Dose Route Frequency Provider Last Rate Last Admin   sodium chloride flush (NS) 0.9 % injection 10 mL  10 mL Intravenous PRN Nicholas Lose, MD   10 mL at 09/27/21 1331    PHYSICAL EXAMINATION: ECOG PERFORMANCE STATUS: 2 - Symptomatic, <50% confined to bed  Vitals:   09/27/21 1051  BP: 133/67  Pulse: 77  Resp: 18  Temp: (!) 97.2 F (36.2 C)  SpO2: 99%   Filed Weights   09/27/21 1051  Weight: 141 lb 3.2 oz (64 kg)    LABORATORY DATA:  I have reviewed the data as listed CMP Latest Ref Rng & Units 09/27/2021 09/24/2021 09/24/2021  Glucose 70 - 99 mg/dL 121(H) 88 86  BUN 8 - 23 mg/dL 30(H) 18 20  Creatinine 0.44 - 1.00 mg/dL 0.67 0.56 0.64  Sodium 135 - 145 mmol/L 138 137 139   Potassium 3.5 - 5.1 mmol/L 3.8 3.2(L) 3.3(L)  Chloride 98 - 111 mmol/L 105 104 105  CO2 22 - 32 mmol/L 26 27 27   Calcium 8.9 - 10.3 mg/dL 8.8(L) 8.1(L) 8.3(L)  Total Protein 6.5 - 8.1 g/dL 6.2(L) 5.1(L) 5.3(L)  Total Bilirubin 0.3 - 1.2 mg/dL 1.0 0.9 0.9  Alkaline Phos 38 - 126 U/L 398(H) 348(H) 362(H)  AST 15 -  41 U/L 99(H) 73(H) 75(H)  ALT 0 - 44 U/L 72(H) 53(H) 55(H)    Lab Results  Component Value Date   WBC 6.2 09/27/2021   HGB 11.0 (L) 09/27/2021   HCT 32.2 (L) 09/27/2021   MCV 106.3 (H) 09/27/2021   PLT 91 (L) 09/27/2021   NEUTROABS 4.5 09/27/2021    ASSESSMENT & PLAN:  Metastatic breast cancer (HCC) Current Treatment: Kadcyla q [redacted] weeks along with Verzenio and letrozole. (she completed bevacizumab x4) (Prior treatment abemaciclib with letrozole and Herceptin)  Biopsy of the liver lesion.  12/11/2020: Metastatic carcinoma, ER 75% week to moderate, PR 0%, Ki-67 25%, HER-2 equivocal by IHC, FISH positive ratio 2.97, copy #5.2  CT CAP 09/15/21: Interval progression of liver mets (3.4 cm prev 2.1 cm, right lesion 2.1 cm was 1.3 cm, 1.6 cm was 1.1 cm, left liver lesion 1.4 cm, similar bone mets)  MRI Brain 09/24/21: Prog of vasogenic edema Rt Fronto Parietal lobe, diffuse mets to Rigth side of skull with dural invasion, Small Rt Ant temporal lobe stable, Widespread bone mets to calvarium and C-Spine We requested Dr. Mickeal Skinner to see the patient who was kind enough to see her today.  Hospitalization: 09/23/21- 09/25/21: Seizure (on Keppra) Plan: switch treatment to Enhertu Discussed the risks and benefits of Enhertu and the serious risks including interstitial lung disease as well as blood count changes, nausea and LFT changes    Orders Placed This Encounter  Procedures   CBC with Differential ([place] Only)    Standing Status:   Standing    Number of Occurrences:   20    Standing Expiration Date:   09/27/2022   CMP ([place] only)    Standing Status:   Standing     Number of Occurrences:   20    Standing Expiration Date:   09/27/2022   CBC with Differential    Standing Status:   Standing    Number of Occurrences:   20    Standing Expiration Date:   09/27/2022   Comprehensive metabolic panel    Standing Status:   Standing    Number of Occurrences:   20    Standing Expiration Date:   09/27/2022   PHYSICIAN COMMUNICATION ORDER    Please obtain Echo/Muga at baseline and every 3 months during Enhertu treatment.   The patient has a good understanding of the overall plan. she agrees with it. she will call with any problems that may develop before the next visit here.  Total time spent: 30 mins including face to face time and time spent for planning, charting and coordination of care  Rulon Eisenmenger, MD, MPH 09/27/2021  I, Thana Ates, am acting as scribe for Dr. Nicholas Lose.  I have reviewed the above documentation for accuracy and completeness, and I agree with the above.

## 2021-09-26 NOTE — Assessment & Plan Note (Signed)
Current Treatment:Kadcyla q4weeksalong with Verzenioand letrozole.(she completed bevacizumab x4) (Prior treatment abemaciclib with letrozole and Herceptin) Biopsy of the liver lesion.12/11/2020: Metastatic carcinoma, ER 75% week to moderate, PR 0%, Ki-67 25%, HER-2 equivocal by IHC, FISH positive ratio 2.97, copy #5.2  CT CAP 09/15/21: Interval progression of liver mets (3.4 cm prev 2.1 cm, right lesion 2.1 cm was 1.3 cm, 1.6 cm was 1.1 cm, left liver lesion 1.4 cm, similar bone mets)  MRI Brain 09/24/21: Prog of vasogenic edema Rt Fronto Parietal lobe, diffuse mets to Rigth side of skull with dural invasion, Small Rt Ant temporal lobe stable, Widespread bone mets to calvarium and C-Spine  Hospitalization: 09/23/21- 09/25/21: Seizure (on Keppra) Plan: switch treatment to Enhertu Discussed the risks and benefits of Enhertu and the serious risks including interstitial lung disease as well as blood count changes, nausea and LFT changes

## 2021-09-27 ENCOUNTER — Inpatient Hospital Stay (HOSPITAL_BASED_OUTPATIENT_CLINIC_OR_DEPARTMENT_OTHER): Payer: Medicare Other | Admitting: Hematology and Oncology

## 2021-09-27 ENCOUNTER — Inpatient Hospital Stay: Payer: Medicare Other

## 2021-09-27 ENCOUNTER — Other Ambulatory Visit: Payer: Self-pay

## 2021-09-27 ENCOUNTER — Ambulatory Visit (HOSPITAL_COMMUNITY)
Admission: RE | Admit: 2021-09-27 | Discharge: 2021-09-27 | Disposition: A | Discharge status: Home / Self Care | Payer: Medicare Other | Source: Ambulatory Visit | Attending: Hematology and Oncology | Admitting: Hematology and Oncology

## 2021-09-27 ENCOUNTER — Inpatient Hospital Stay (HOSPITAL_BASED_OUTPATIENT_CLINIC_OR_DEPARTMENT_OTHER): Payer: Medicare Other | Admitting: Internal Medicine

## 2021-09-27 VITALS — BP 133/67 | HR 77 | Temp 97.2°F | Resp 18 | Ht 68.0 in | Wt 141.2 lb

## 2021-09-27 DIAGNOSIS — I351 Nonrheumatic aortic (valve) insufficiency: Secondary | ICD-10-CM | POA: Insufficient documentation

## 2021-09-27 DIAGNOSIS — C50412 Malignant neoplasm of upper-outer quadrant of left female breast: Secondary | ICD-10-CM | POA: Insufficient documentation

## 2021-09-27 DIAGNOSIS — I6789 Other cerebrovascular disease: Secondary | ICD-10-CM | POA: Diagnosis not present

## 2021-09-27 DIAGNOSIS — C7931 Secondary malignant neoplasm of brain: Secondary | ICD-10-CM | POA: Insufficient documentation

## 2021-09-27 DIAGNOSIS — Z923 Personal history of irradiation: Secondary | ICD-10-CM | POA: Diagnosis not present

## 2021-09-27 DIAGNOSIS — C50919 Malignant neoplasm of unspecified site of unspecified female breast: Secondary | ICD-10-CM

## 2021-09-27 DIAGNOSIS — C7951 Secondary malignant neoplasm of bone: Secondary | ICD-10-CM

## 2021-09-27 DIAGNOSIS — Y842 Radiological procedure and radiotherapy as the cause of abnormal reaction of the patient, or of later complication, without mention of misadventure at the time of the procedure: Secondary | ICD-10-CM

## 2021-09-27 DIAGNOSIS — Z0189 Encounter for other specified special examinations: Secondary | ICD-10-CM

## 2021-09-27 DIAGNOSIS — Z95828 Presence of other vascular implants and grafts: Secondary | ICD-10-CM

## 2021-09-27 DIAGNOSIS — Z5181 Encounter for therapeutic drug level monitoring: Secondary | ICD-10-CM

## 2021-09-27 DIAGNOSIS — Z170 Estrogen receptor positive status [ER+]: Secondary | ICD-10-CM | POA: Insufficient documentation

## 2021-09-27 DIAGNOSIS — Z9221 Personal history of antineoplastic chemotherapy: Secondary | ICD-10-CM | POA: Insufficient documentation

## 2021-09-27 DIAGNOSIS — Z79899 Other long term (current) drug therapy: Secondary | ICD-10-CM | POA: Diagnosis not present

## 2021-09-27 LAB — CBC WITH DIFFERENTIAL (CANCER CENTER ONLY)
Abs Immature Granulocytes: 0.06 10*3/uL (ref 0.00–0.07)
Basophils Absolute: 0 10*3/uL (ref 0.0–0.1)
Basophils Relative: 0 %
Eosinophils Absolute: 0 10*3/uL (ref 0.0–0.5)
Eosinophils Relative: 0 %
HCT: 32.2 % — ABNORMAL LOW (ref 36.0–46.0)
Hemoglobin: 11 g/dL — ABNORMAL LOW (ref 12.0–15.0)
Immature Granulocytes: 1 %
Lymphocytes Relative: 18 %
Lymphs Abs: 1.1 10*3/uL (ref 0.7–4.0)
MCH: 36.3 pg — ABNORMAL HIGH (ref 26.0–34.0)
MCHC: 34.2 g/dL (ref 30.0–36.0)
MCV: 106.3 fL — ABNORMAL HIGH (ref 80.0–100.0)
Monocytes Absolute: 0.6 10*3/uL (ref 0.1–1.0)
Monocytes Relative: 9 %
Neutro Abs: 4.5 10*3/uL (ref 1.7–7.7)
Neutrophils Relative %: 72 %
Platelet Count: 91 10*3/uL — ABNORMAL LOW (ref 150–400)
RBC: 3.03 MIL/uL — ABNORMAL LOW (ref 3.87–5.11)
RDW: 14.6 % (ref 11.5–15.5)
WBC Count: 6.2 10*3/uL (ref 4.0–10.5)
nRBC: 0 % (ref 0.0–0.2)

## 2021-09-27 LAB — CMP (CANCER CENTER ONLY)
ALT: 72 U/L — ABNORMAL HIGH (ref 0–44)
AST: 99 U/L — ABNORMAL HIGH (ref 15–41)
Albumin: 2.9 g/dL — ABNORMAL LOW (ref 3.5–5.0)
Alkaline Phosphatase: 398 U/L — ABNORMAL HIGH (ref 38–126)
Anion gap: 7 (ref 5–15)
BUN: 30 mg/dL — ABNORMAL HIGH (ref 8–23)
CO2: 26 mmol/L (ref 22–32)
Calcium: 8.8 mg/dL — ABNORMAL LOW (ref 8.9–10.3)
Chloride: 105 mmol/L (ref 98–111)
Creatinine: 0.67 mg/dL (ref 0.44–1.00)
GFR, Estimated: >60 mL/min (ref >60)
Glucose, Bld: 121 mg/dL — ABNORMAL HIGH (ref 70–99)
Potassium: 3.8 mmol/L (ref 3.5–5.1)
Sodium: 138 mmol/L (ref 135–145)
Total Bilirubin: 1 mg/dL (ref 0.3–1.2)
Total Protein: 6.2 g/dL — ABNORMAL LOW (ref 6.5–8.1)

## 2021-09-27 LAB — ECHOCARDIOGRAM COMPLETE
Area-P 1/2: 3.68 cm2
P 1/2 time: 540 msec
S' Lateral: 2.7 cm

## 2021-09-27 MED ORDER — PROCHLORPERAZINE MALEATE 10 MG PO TABS: 10.0000 mg | ORAL_TABLET | Freq: Four times a day (QID) | ORAL | 1 refills | Status: DC | PRN

## 2021-09-27 MED ORDER — ONDANSETRON HCL 8 MG PO TABS: 8.0000 mg | ORAL_TABLET | Freq: Two times a day (BID) | ORAL | 1 refills | Status: DC | PRN

## 2021-09-27 MED ORDER — LIDOCAINE-PRILOCAINE 2.5-2.5 % EX CREA: TOPICAL_CREAM | CUTANEOUS | 3 refills | Status: DC

## 2021-09-27 MED ORDER — DEXAMETHASONE 4 MG PO TABS: 4.0000 mg | ORAL_TABLET | Freq: Every day | ORAL | 0 refills | Status: DC

## 2021-09-27 MED ORDER — SODIUM CHLORIDE 0.9% FLUSH
10.0000 mL | Freq: Once | INTRAVENOUS | Status: AC
Start: 2021-09-27 — End: 2021-09-27
  Administered 2021-09-27: 10 mL

## 2021-09-27 MED ORDER — HEPARIN SOD (PORK) LOCK FLUSH 100 UNIT/ML IV SOLN
500.0000 [IU] | Freq: Once | INTRAVENOUS | Status: AC
  Administered 2021-09-27: 500 [IU] via INTRAVENOUS

## 2021-09-27 MED ORDER — SODIUM CHLORIDE 0.9% FLUSH
10.0000 mL | INTRAVENOUS | Status: DC | PRN
  Administered 2021-09-27: 10 mL via INTRAVENOUS

## 2021-09-27 MED ORDER — TRAZODONE HCL 50 MG PO TABS: 50.0000 mg | ORAL_TABLET | Freq: Every day | ORAL | 3 refills | Status: DC

## 2021-09-27 NOTE — Progress Notes (Signed)
[facility] [place], [place] [address] [phone]   Interval Evaluation  Date of Service: 09/27/21 Patient Name: [name] Riley Patient MRN: [id number] Patient DOB: 1950-11-10 Provider: Ventura Sellers, MD  Identifying Statement:  [name] Riley is a 70 y.o. female with seizures, brain metastases  Primary Cancer:  Oncologic History: Oncology History  Breast cancer of upper-outer quadrant of left female breast ([place])  11/14/2011 Surgery   Bilateral mastectomy, prophylactic on the right, left breast IDC 3/18 lymph nodes positive with extracapsular extension ER 89%, PR 81%, HER-2 negative, Ki-67 79% T2 N1 A. stage IIB   12/13/2011 - 06/28/2012 Chemotherapy   4 cycles of FEC followed by 4 cycles of Taxotere   07/17/2012 - 08/22/2012 Radiation Therapy   Adjuvant radiation therapy   08/22/2012 - 03/16/2016 Anti-estrogen oral therapy   Arimidex 1 mg daily   03/16/2016 Relapse/Recurrence   Subcutaneous nodule excision left chest: Infiltrating carcinoma breast primary, ER positive, PR negative   03/29/2016 Imaging   CT CAP and bone scan: Lytic lesions T8 vertebral, T1 posterior element, subcutaneous nodule left lateral chest wall, nonspecific lung nodules; Bone scan: Mets to kull, left humerus, left eighth rib, T7/T8, sternum, left acetabulum   04/28/2016 - 06/17/2017 Chemotherapy   Herceptin, lapatinib, Faslodex, Zometa every 4 weeks, lapatinib discontinued in September 2018 due to elevation of LFTs   06/22/2017 Relapse/Recurrence   Surgical excision:Soft tissue mass left lateral chest wall primary breast cancer, soft tissue mass left medial chest wall breast cancer, tumor is within the dermis extending to the subcutaneous adipose tissue and involves portions of skeletal muscle   06/22/2017 Cancer Staging   Staging form: Breast, AJCC 7th Edition - Pathologic stage from 06/22/2017: Stage IV (TX, NX, M1) - Signed by [name] Lose, MD on  12/27/2019    08/2017 - 02/16/2018 Chemotherapy   Faslodex with Herceptin and Perjeta along with Zometa every 4 weeks   03/02/2018 - 05/25/2018 Chemotherapy   Kadcyla   05/11/2018 Imaging   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 Associated vasogenic edema within the underlying right cerebral hemisphere without significant midline shift. Signal abnormality throughout the visualized bone marrow, compatible with osseous metastatic disease.    06/04/2018 Imaging   CT CAP: Right lower lobe lung nodule 7 mm (was 5 mm); multiple bone metastases throughout the spine and ribs sternum scapula and humerus, slightly increased lower thoracic mets, right renal lesion 2.5 cm (was 1.2 cm) right femur met increased from 2.1 cm to 2.9 cm   06/15/2018 -  Anti-estrogen oral therapy   Abemaciclib, Herceptin, letrozole, Xgeva   06/07/2019 Imaging   Progression of brain metastases,2 discrete dural-based lesions involving the posterior right frontal lobe. The more anterior lesion now measures 16.5 x 17.5 x 14 mm. The posterior lesion now measures 9.5 x 13 x 20 mm.  Vasogenic edema of the frontal and parietal lobes   06/21/2019 - 06/27/2019 Radiation Therapy   SRS to the brain   07/19/2020 Relapse/Recurrence   Left mastectomy scar nodule: Biopsy invasive carcinoma ER 60%, PR 0%, HER-2 negative by [facility]   07/29/2020 Surgery   Soft tissue mass left chest wall excision: No evidence of malignancy.   11/27/2020 - 02/05/2021 Chemotherapy      Patient is on Antibody Plan: BREAST ADO-TRASTUZUMAB EMTANSINE [facility]) Q21D     01/15/2021 - 08/27/2021 Chemotherapy   Patient is on Treatment Plan : BREAST ADO-Trastuzumab Emtansine ([place]) q21d     10/04/2021 -  Chemotherapy   Patient is on Treatment Plan : BREAST METASTATIC fam-trastuzumab deruxtecan-nxki (Enhertu) q21d     10/04/2021 -  Chemotherapy   Patient is on Treatment Plan : BRAIN GBM Bevacizumab 14d x 6 cycles     Metastatic breast cancer  ([place])  06/29/2017 Initial Diagnosis   Metastatic breast cancer ([place])   06/15/2018 - 12/25/2020 Chemotherapy    Patient is on Treatment Plan: BRAIN GBM BEVACIZUMAB 14D X 6 CYCLES       01/15/2021 - 08/27/2021 Chemotherapy   Patient is on Treatment Plan : BREAST ADO-Trastuzumab Emtansine ([place]la) q21d     10/04/2021 -  Chemotherapy   Patient is on Treatment Plan : BRAIN GBM Bevacizumab 14d x 6 cycles     Brain metastasis ([place])  07/13/2018 Initial Diagnosis   Brain metastasis ([place])   11/27/2020 - 02/05/2021 Chemotherapy      Patient is on Antibody Plan: BREAST ADO-TRASTUZUMAB EMTANSINE ([place]) Q21D     10/04/2021 -  Chemotherapy   Patient is on Treatment Plan : BREAST METASTATIC fam-trastuzumab deruxtecan-nxki (Enhertu) q21d       Interval History:  [name] Riley presents today after recent hospitalization for breakthrough seizure.  Family described episode of generalized shaking, loss of consciousness on 09/24/21.  She was confused and weak following episode; her left sided weakness has improved from the weekend but is still worse than baseline strength.  No recurrence of events since hospitalization, decadron was increased to 10m BID, Keppra to 15026mBID.  Systemically progressive, [name] Riley Enhertu therapy.  Dexamethasone  09/03/20: 33m35m1/30/21: 1mg6m/12/22: 18mg133m14/22: 4mg  61m09/22: 4mg BI69m2/12/22: 4mg dai28m Medications: Current Outpatient Medications on File Prior to Visit  Medication Sig Dispense Refill   abemaciclib (VERZENIO) 50 MG tablet Take 1 tablet (50 mg total) by mouth 2 (two) times daily. 56 tablet 3   acetaminophen (TYLENOL) 500 MG tablet Take 1,000 mg by mouth every 6 (six) hours as needed for moderate pain.     Ascorbic Acid (VITAMIN C PO) Take by mouth.     cholecalciferol (VITAMIN D) 1000 units tablet Take 1 tablet (1,000 Units total) by mouth daily.     dexamethasone (DECADRON) 4 MG tablet Take 1 tablet (4 mg total) by mouth 2 (two)  times daily with a meal. 60 tablet 0   Docusate Sodium (DSS) 100 MG CAPS Take by mouth.     furosemide (LASIX) 20 MG tablet TAKE ONE (1) TABLET BY MOUTH EVERY DAY 30 tablet 1   letrozole (FEMARA) 2.5 MG tablet TAKE ONE (1) TABLET EACH DAY 90 tablet 3   levETIRAcetam (KEPPRA) 750 MG tablet Take 2 tablets (1,500 mg total) by mouth 2 (two) times daily. 120 tablet 1   levothyroxine (SYNTHROID) 100 MCG tablet TAKE ONE TABLET EACH MORNING BEFORE BREAKFAST 90 tablet 3   lidocaine-prilocaine (EMLA) cream APPLY TOPICALLY AS NEEDED FOR PORT ACCESS 30 g 3   metoprolol succinate (TOPROL-XL) 100 MG 24 hr tablet Take with or immediately following a meal. (Patient taking differently: Take 100 mg by mouth daily. Take with or immediately following a meal.) 90 tablet 3   Multiple Vitamins-Minerals (MULTIVITAMIN WITH MINERALS) tablet Take 1 tablet by mouth daily.     ondansetron (ZOFRAN-ODT) 4 MG disintegrating tablet TAKE 1 TABLET EVERY 6 HOURS AS NEEDED FOR NAUSEA AND VOMITING 30 tablet 3   pantoprazole (PROTONIX) 40 MG tablet Take 1 tablet (40 mg total) by mouth daily. 30 tablet 1   potassium chloride SA (KLOR-CON)  20 MEQ tablet Take 1 tablet (20 mEq total) by mouth 2 (two) times daily for 7 days. 14 tablet 0   triamcinolone (KENALOG) 0.025 % ointment Apply 1 application topically 2 (two) times daily. 80 g 1   valsartan (DIOVAN) 80 MG tablet TAKE ONE (1) TABLET EACH DAY 90 tablet 3   dexamethasone (DECADRON) 4 MG tablet Take 1 tablet (4 mg total) by mouth daily. Start the day after chemotherapy for 2 days. 20 tablet 0   lidocaine-prilocaine (EMLA) cream Apply to affected area once 30 g 3   ondansetron (ZOFRAN) 8 MG tablet Take 1 tablet (8 mg total) by mouth 2 (two) times daily as needed for refractory nausea / vomiting. Start on day 3 after chemo. 30 tablet 1   prochlorperazine (COMPAZINE) 10 MG tablet Take 1 tablet (10 mg total) by mouth every 6 (six) hours as needed (Nausea or vomiting). 30 tablet 1    rosuvastatin (CRESTOR) 5 MG tablet Take 1 tablet (5 mg total) by mouth daily. (new directions) (Patient not taking: Reported on 09/27/2021) 90 tablet 3   No current facility-administered medications on file prior to visit.    Allergies:  Allergies  Allergen Reactions   Cantaloupe Extract Allergy Skin Test Shortness Of Breath   Contrast Media [Iodinated Diagnostic Agents] Shortness Of Breath and Rash   Pravastatin Other (See Comments)    Legs hurt   Zosyn [Piperacillin Sod-Tazobactam So] Rash and Other (See Comments)    Temperature increase, facial flushing   Latex Rash    Redness, itch    Past Medical History:  Past Medical History:  Diagnosis Date   Allergy    Breast cancer ([place]) 08/25/2011   L , invasive ductal carcinoma, ER/PR +,HER2 -   Cancer (H[facility])    left breast cancer   Coronary artery disease 2001   Heart attack [facility]) 09/2000   Sep 25, 2000  --no intervention   History of chemotherapy comp. 08/22/2012   4 cycles of FEC and $ cycles of Taxotere   Hyperlipidemia    Hypertension    Hypothyroidism    PONV (postoperative nausea and vomiting)    gets sick from anesthesia   Status post radiation therapy 07/09/12 - 08/22/2012   Left Breast, 60.4 gray   Past Surgical History:  Past Surgical History:  Procedure Laterality Date   ABDOMINAL HYSTERECTOMY  1998   TAH, oophorectomy   APPENDECTOMY  1970   BREAST CYST EXCISION Left 07/29/2020   Procedure: WIDE EXCISION OF LEFT MASTECTOMY INCISION;  Surgeon: Donnie Mesa, MD;  Location: [facility];  Service: General;  Laterality: Left;   [place]   removal of benign lump in rt breast   BREAST SURGERY  11/14/11   right simple mastectomy, left mrm   INCISION AND DRAINAGE OF WOUND Left 07/01/2017   Procedure: IRRIGATION AND DEBRIDEMENT CHEST WALL ABSCESS;  Surgeon: Donnie Mesa, MD;  Location: [facility];  Service: General;  Laterality: Left;   MASS EXCISION Left 06/22/2017   Procedure: EXCISION OF CHEST  WALL MASSES;  Surgeon: Donnie Mesa, MD;  Location: [facility];  Service: General;  Laterality: Left;   MASTECTOMY Bilateral    for left breast cancer   OVARIAN CYST SURGERY Right [place]  08/30/2012   Procedure: REMOVAL PORT-A-CATH;  Surgeon: Imogene Burn. [name], MD;  Location: [facility];  Service: General;  Laterality: Right;  port removal   PORTACATH PLACEMENT  11/14/2011   Procedure: INSERTION PORT-A-CATH;  Surgeon: Imogene Burn. [name], MD;  Location: [facility];  Service: General;  Laterality: Right;   PORTACATH PLACEMENT N/A 04/25/2016   Procedure: INSERTION PORT-A-CATH LEFT CHEST;  Surgeon: Donnie Mesa, MD;  Location: [facility];  Service: General;  Laterality: N/A;   skin tags  05/09/1997   left axillary left neck skin tags   TONSILLECTOMY  1968   Social History:  Social History   Socioeconomic History   Marital status: Married    Spouse name: Not on file   Number of children: 3   Years of education: Not on file   Highest education level: High school graduate  Occupational History   Occupation: Retired  Tobacco Use   Smoking status: Never   Smokeless tobacco: Never  Scientific laboratory technician Use: Never used  Substance and Sexual Activity   Alcohol use: No   Drug use: No   Sexual activity: Yes    Birth control/protection: Surgical    Comment: menarche 68, Parity age 89, G27, P3, 1 miscarriage,  HRT x 5-10 yrs, Mild Hot Flashes  Other Topics Concern   Not on file  Social History Narrative   Lives at home.   Social Determinants of Health   Financial Resource Strain: Not on file  Food Insecurity: Not on file  Transportation Needs: Not on file  Physical Activity: Not on file  Stress: Not on file  Social Connections: Not on file  Intimate Partner Violence: Not on file   Family History:  Family History  Problem Relation Age of Onset   Hypertension Maternal Grandmother    Diabetes Maternal Grandmother    Cancer Father 1       lung  cancer and Prostate Cancer   Hypertension Mother    Cancer Paternal Aunt        ovarian   Cancer Cousin        breast, paternal cousin   Cancer Paternal Uncle        stomach   Cancer Paternal Grandfather        Esophagus   Colon cancer Neg Hx     Review of Systems: Constitutional: Denies fevers, chills or abnormal weight loss Eyes: Denies blurriness of vision Ears, nose, mouth, throat, and face: Denies mucositis or sore throat Respiratory: Denies cough, dyspnea or wheezes Cardiovascular: Denies palpitation, chest discomfort or lower extremity swelling Gastrointestinal:  Denies nausea, constipation, diarrhea GU: Denies dysuria or incontinence Skin: Denies abnormal skin rashes Neurological: Per HPI Musculoskeletal: Denies joint pain, back or neck discomfort. No decrease in ROM Behavioral/Psych: Denies anxiety, disturbance in thought content, and mood instability   Physical Exam: Wt Readings from Last 3 Encounters:  09/27/21 141 lb 3.2 oz (64 kg)  09/23/21 132 lb 4.4 oz (60 kg)  08/28/21 132 lb 4.4 oz (60 kg)   Temp Readings from Last 3 Encounters:  09/27/21 (!) 97.2 F (36.2 C)  09/25/21 98.3 F (36.8 C) (Oral)  08/28/21 97.8 F (36.6 C) (Oral)   BP Readings from Last 3 Encounters:  09/27/21 133/67  09/25/21 130/80  08/28/21 129/81   Pulse Readings from Last 3 Encounters:  09/27/21 77  09/25/21 79  08/28/21 77    KPS: 60. General: Alert, cooperative, pleasant, in no acute distress Head: Normal EENT: No conjunctival injection or scleral icterus. Oral mucosa moist Lungs: Resp effort normal Cardiac: Regular rate and rhythm Abdomen: Soft, non-distended abdomen Skin: No rashes cyanosis or petechiae. Extremities: R lower leg wound, wrapped  Neurologic Exam: Mental Status: Awake,  alert, attentive to examiner. Oriented to self and environment. Language is fluent with intact comprehension.  Cranial Nerves: Visual acuity is grossly normal. Visual fields are full.  Extra-ocular movements intact. No ptosis. Face is symmetric, tongue midline. Motor: Tone and bulk are normal.  Left arm and leg 4/5. Reflexes are symmetric, no pathologic reflexes present. Intact finger to nose bilaterally Sensory: Normal Gait: Hemiparetic   Labs: I have reviewed the data as listed    Component Value Date/Time   NA 138 09/27/2021 1001   NA 140 08/12/2021 0918   NA 140 09/22/2017 0958   K 3.8 09/27/2021 1001   K 3.7 09/22/2017 0958   CL 105 09/27/2021 1001   CL 105 08/10/2012 0908   CO2 26 09/27/2021 1001   CO2 26 09/22/2017 0958   GLUCOSE 121 (H) 09/27/2021 1001   GLUCOSE 80 09/22/2017 0958   GLUCOSE 81 08/10/2012 0908   BUN 30 (H) 09/27/2021 1001   BUN 12 08/12/2021 0918   BUN 12.8 09/22/2017 0958   CREATININE 0.67 09/27/2021 1001   CREATININE 0.6 09/22/2017 0958   CALCIUM 8.8 (L) 09/27/2021 1001   CALCIUM 9.1 09/22/2017 0958   PROT 6.2 (L) 09/27/2021 1001   PROT 6.5 08/12/2021 0918   PROT 7.0 09/22/2017 0958   ALBUMIN 2.9 (L) 09/27/2021 1001   ALBUMIN 3.8 08/12/2021 0918   ALBUMIN 4.0 09/22/2017 0958   AST 99 (H) 09/27/2021 1001   AST 37 (H) 09/22/2017 0958   ALT 72 (H) 09/27/2021 1001   ALT 31 09/22/2017 0958   ALKPHOS 398 (H) 09/27/2021 1001   ALKPHOS 140 09/22/2017 0958   BILITOT 1.0 09/27/2021 1001   BILITOT 0.41 09/22/2017 0958   GFRNONAA >60 09/27/2021 1001   GFRAA 76 09/02/2020 0806   GFRAA >60 07/10/2020 0820   Lab Results  Component Value Date   WBC 6.2 09/27/2021   NEUTROABS 4.5 09/27/2021   HGB 11.0 (L) 09/27/2021   HCT 32.2 (L) 09/27/2021   MCV 106.3 (H) 09/27/2021   PLT 91 (L) 09/27/2021     Imaging:  [facility] Clinician Interpretation: I have personally reviewed the CNS images as listed.  My interpretation, in the context of the patient's clinical presentation, is treatment effect vs true progression  DG Chest 1 View  Result Date: 09/24/2021 CLINICAL DATA:  Recent seizure-like activity, initial encounter EXAM: CHEST  1 VIEW  COMPARISON:  09/15/2021 FINDINGS: Left chest wall port is again noted and stable. Cardiac shadow is within normal limits. The lungs are well aerated bilaterally. No focal infiltrate is seen. Sclerotic changes are noted in the bony structures consistent with metastatic disease. IMPRESSION: Diffuse sclerotic metastatic disease. No intrathoracic abnormality is noted. Electronically Signed   By: Inez Catalina M.D.   On: 09/24/2021 00:35   CT HEAD WO CONTRAST (5MM)  Result Date: 09/24/2021 CLINICAL DATA:  History of breast carcinoma with known metastatic disease. EXAM: CT HEAD WITHOUT CONTRAST TECHNIQUE: Contiguous axial images were obtained from the base of the skull through the vertex without intravenous contrast. COMPARISON:  08/28/2021 FINDINGS: Brain: Persistent vasogenic edema is noted within the right frontal and parietal lobes similar to that seen on the prior exam. This is again consistent with the known history of metastatic disease. No findings to suggest acute hemorrhage or acute infarction are noted. Vascular: No hyperdense vessel or unexpected calcification. Skull: Diffuse bony metastatic disease is noted consistent with the given clinical history. Sinuses/Orbits: No acute finding. Other: None. IMPRESSION: Stable vasogenic edema throughout the right frontal and  parietal lobe consistent with the known history of metastatic disease. These changes were better visualized on recent MRI examination. No new focal abnormality is noted. Findings consistent with diffuse bony metastatic disease. Electronically Signed   By: Inez Catalina M.D.   On: 09/24/2021 00:41   MR BRAIN WO CONTRAST  Result Date: 09/24/2021 CLINICAL DATA:  Acute neuro deficit. Suspect stroke. Seizure. History of metastatic breast cancer. EXAM: MRI HEAD WITHOUT CONTRAST TECHNIQUE: Multiplanar, multiecho pulse sequences of the brain and surrounding structures were obtained without intravenous contrast. COMPARISON:  CT head 09/24/2021.  MRI  head 09/03/2021 FINDINGS: Brain: Progressive vasogenic edema in the right frontal parietal lobe over the convexity. Associated mass lesions in the right frontal parietal lobe again noted. These showed enhancement on the prior study. Contrast not administered today. These lesions show susceptibility, similar to the prior study. There is also dural thickening on the right due to tumor invasion likely from the calvarium. Small lesion in the right anterior temporal lobe best seen on axial FLAIR image 14. This enhanced on the prior study and is unchanged. Ventricle size normal.  No midline shift.  No acute infarct. Vascular: Normal arterial flow voids Skull and upper cervical spine: Extensive metastatic disease to the calvarium primarily on the right side. Diffuse metastatic disease in the cervical spine. Sinuses/Orbits: Mucosal edema paranasal sinuses.  Negative orbit Other: None IMPRESSION: 1. Progression of vasogenic edema in the right frontal parietal lobe. Several associated lesions are present in the right frontal parietal lobe which enhanced on the prior study. These show susceptibility. There is diffuse metastatic disease to the right side of the skull with dural invasion 2. Small lesion right anterior temporal lobe, stable 3. 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 Electronically Signed   By: Franchot Gallo M.D.   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 CONTRAST:  38m GADAVIST GADOBUTROL 1 MMOL/ML IV SOLN COMPARISON:  06/02/2021 FINDINGS: Brain: Increase in size of irregular enhancement within the right  frontal and parietal lobes underlying and contiguous with dural based enhancement. The more anterior region abutting the precentral sulcus measures approximately 2 x 1.6 cm (remeasured previously as 1.5 x 1.2 cm). The more posterior region centered within the postcentral gyrus with some precentral involvement superiorly measures 1.3 x 1.4 cm (remeasured previously as 0.7 x 0.9 cm). There is substantial increase in surrounding edema in the frontoparietal white matter without significant mass effect. There is unchanged irregular dural thickening more inferior to this region (series 1200, image 93). Increased size of dural based enhancement along the anterior right temporal convexity now measuring 6 mm (previously 4 mm) on series 1200, image 170. Vascular: Major vessel flow voids at the skull base are preserved. Skull and upper cervical spine: Abnormal marrow signal reflecting known widespread sclerotic osseous metastatic disease. Sinuses/Orbits: Minor mucosal thickening.  Orbits are unremarkable. Other: Sella is unremarkable.  Mastoid air cells are clear. IMPRESSION: Increase in irregular parenchymal enhancement within the right frontoparietal lobes with substantially increased edema as described. Increase in size of subcentimeter dural-based enhancement along the anterior right temporal convexity. Otherwise stable irregular dural thickening along the right convexity. Parenchymal findings may reflect recurrence of radiation  necrosis or invasion of parenchyma by dural disease. Given presumed prior radiation necrosis responsive to therapy and that the dural-based disease has not significantly progressed, invasion of parenchyma seems less likely. PET could be considered for differentiation. MR perfusion would likely be less helpful due to irregularity of enhancing regions and susceptibility in this region. Reviewed at brain conference 09/06/2021. Electronically Signed   By: Macy Mis M.D.   On: 09/06/2021 08:51    CT CHEST ABDOMEN PELVIS W CONTRAST  Result Date: 09/15/2021 CLINICAL DATA:  Metastatic breast cancer.  Restaging. EXAM: CT CHEST, ABDOMEN, AND PELVIS WITH CONTRAST TECHNIQUE: Multidetector CT imaging of the chest, abdomen and pelvis was performed following the standard protocol during bolus administration of intravenous contrast. CONTRAST:  80m OMNIPAQUE IOHEXOL 350 MG/ML SOLN COMPARISON:  Chest abdomen pelvis CT 04/02/2021 FINDINGS: CT CHEST FINDINGS Cardiovascular: The heart size is normal. No substantial pericardial effusion. No thoracic aortic aneurysm. Left Port-A-Cath tip is positioned in the mid right atrium. Mediastinum/Nodes: No mediastinal lymphadenopathy. There is no hilar lymphadenopathy. Tiny hiatal hernia. The esophagus has normal imaging features. There is no axillary lymphadenopathy. Lungs/Pleura: No suspicious pulmonary nodule or mass. Stable subpleural scarring left lung apex. Subpleural reticulation anterior left upper lobe suggest prior radiation treatment. No focal airspace consolidation. There is no evidence of pleural effusion. Musculoskeletal: Sclerotic bone metastases again noted, similar to prior. 15 mm sternal lesion on 50/4 was 15 mm previously (remeasured). CT ABDOMEN PELVIS FINDINGS Hepatobiliary: Liver metastases better demonstrated on today's postcontrast study. 3.4 cm lesion in the lateral right liver on 64/2 was measured previously at 2.1 cm. 2.1 cm right hepatic lesion on 60/2 was measured previously at 1.3 cm. 1.6 cm lesion medial right liver on 67/2 was very subtle on the prior study measuring about 1.1 cm (remeasured). This lesion was not visible on the postcontrast study from 11/26/2020. Small ill-defined hypoenhancing lesion in the left liver measuring 14 mm on 59/2 was not visible on the prior noncontrast study or a postcontrast exam from 11/26/2020. Tiny calcified gallstone evident. No intrahepatic or extrahepatic biliary dilation. Pancreas: No focal mass lesion. No  dilatation of the main duct. No intraparenchymal cyst. No peripancreatic edema. Spleen: No splenomegaly. No focal mass lesion. Adrenals/Urinary Tract: No adrenal nodule or mass. Cortical scarring noted both kidneys with several left renal cysts again noted. Right ureter unremarkable. Mild fullness of the left ureter is nonspecific. Bladder is moderately distended. Stomach/Bowel: Stomach is decompressed. Duodenum is normally positioned as is the ligament of Treitz. No small bowel wall thickening. No small bowel dilatation. The terminal ileum is normal. The appendix is not well visualized, but there is no edema or inflammation in the region of the cecum. No gross colonic mass. No colonic wall thickening. Large stool volume evident. Vascular/Lymphatic: No abdominal aortic aneurysm. No abdominal lymphadenopathy No pelvic sidewall lymphadenopathy. Reproductive:  There is no adnexal mass. Other: No intraperitoneal free fluid. Musculoskeletal: Stable widespread sclerotic bone metastases again noted. 2.9 cm sclerotic S1 lesion was 3.0 cm previously (remeasured). IMPRESSION: 1. Interval progression of hepatic metastases better demonstrated on today's postcontrast study than on the most recent comparison study. Several lesions on today's study were not visible on the previous noncontrast exam nor on the postcontrast study from 11/26/2020 suggesting new disease. 2. Similar appearance of widespread sclerotic bone metastases. 3. Cholelithiasis. 4. Large stool volume. Imaging features could be compatible with constipation in the appropriate clinical setting. 5. Mild fullness of the left ureter is nonspecific given the bladder distension. Attention on follow-up  recommended. 6. Aortic Atherosclerosis (ICD10-I70.0). Electronically Signed   By: Misty Stanley M.D.   On: 09/15/2021 44:03   NM PET Metabolic Brain  Result Date: 09/15/2021 CLINICAL DATA:  Dementia. Frontotemporal dementia versus Alzheimer's type dementia EXAM: NM PET  METABOLIC BRAIN TECHNIQUE: 9.7 mCi F-18 FDG was injected intravenously. Full-ring PET imaging was performed from the vertex to skull base. CT data was obtained and used for attenuation correction and anatomic localization. PET data set fused with postcontrast T1 weighted MRI data set from 09/03/2021. FASTING BLOOD GLUCOSE:  Value: 112 mg/dl COMPARISON:  Brain MRI 09/03/2021, 06/02/2021 FINDINGS: Two regions of post-contrast T1 weighted MRI enhancement in the high RIGHT frontal lobe have metabolic activity similar to adjacent cortical gyri. No focal activity greater than background gyral activity to clearly suggest brain tumor recurrence. Along the medial margin of the anterior lesion, there is clear hypometabolic activity suggesting tumor necrosis. IMPRESSION: 1. No convincing evidence brain tumor recurrence with metabolic activity of the enhancing high RIGHT frontal lesions similar to adjacent cortical gyri. 2. The more anterior expanding RIGHT frontal lesion does have hypometabolism along the medial border suggesting tumor necrosis. Electronically Signed   By: Suzy Bouchard M.D.   On: 09/15/2021 09:20   ECHOCARDIOGRAM COMPLETE  Result Date: 09/27/2021    ECHOCARDIOGRAM REPORT   Patient Name:   [name] Riley Mair Date of Exam: 09/27/2021 Medical Rec #:  [id number]      Height:       68.0 in Accession #:    [id number]     Weight:       132.3 lb Date of Birth:  [birth year]      BSA:          1.714 m Patient Age:    53 years       BP:           138/74 mmHg Patient Gender: F              HR:           69 bpm. Exam Location:  Outpatient Procedure: 2D Echo, 3D Echo, Cardiac Doppler, Color Doppler and Strain Analysis Indications:    Chemo Z09  History:        Patient has prior history of Echocardiogram examinations. CAD                 and Previous Myocardial Infarction; Risk Factors:Hypertension                 and Dyslipidemia. Breast cancer. Seizure with left sided                 weakness.  Sonographer:    Darlina Sicilian RDCS Referring Phys: [id number] [name] Riley  Sonographer Comments: Echo perfored with the patient supine. IMPRESSIONS  1. Left ventricular ejection fraction, by estimation, is 60 to 65%. The left ventricle has normal function. The left ventricle has no regional wall motion abnormalities. Left ventricular diastolic parameters are consistent with Grade I diastolic dysfunction (impaired relaxation). The average left ventricular global longitudinal strain is -16.7 %.  2. Right ventricular systolic function is normal. The right ventricular size is normal. There is normal pulmonary artery systolic pressure.  3. Left atrial size was moderately dilated.  4. The mitral valve is normal in structure. No evidence of mitral valve regurgitation. No evidence of mitral stenosis.  5. The aortic valve is tricuspid. There is mild calcification of the aortic valve. Aortic valve regurgitation is mild. No aortic stenosis is  present.  6. The inferior vena cava is normal in size with greater than 50% respiratory variability, suggesting right atrial pressure of 3 mmHg. FINDINGS  Left Ventricle: Left ventricular ejection fraction, by estimation, is 60 to 65%. The left ventricle has normal function. The left ventricle has no regional wall motion abnormalities. The average left ventricular global longitudinal strain is -16.7 %. The global longitudinal strain is normal despite suboptimal segment tracking. The left ventricular internal cavity size was normal in size. There is no left ventricular hypertrophy. Left ventricular diastolic parameters are consistent with Grade I diastolic dysfunction (impaired relaxation). Right Ventricle: The right ventricular size is normal. No increase in right ventricular wall thickness. Right ventricular systolic function is normal. There is normal pulmonary artery systolic pressure. The tricuspid regurgitant velocity is 1.86 m/s, and  with an assumed right atrial pressure of 3 mmHg, the estimated right  ventricular systolic pressure is 86.7 mmHg. Left Atrium: Left atrial size was moderately dilated. Right Atrium: Right atrial size was normal in size. Pericardium: There is no evidence of pericardial effusion. Mitral Valve: The mitral valve is normal in structure. Mild mitral annular calcification. No evidence of mitral valve regurgitation. No evidence of mitral valve stenosis. Tricuspid Valve: The tricuspid valve is normal in structure. Tricuspid valve regurgitation is trivial. No evidence of tricuspid stenosis. Aortic Valve: The aortic valve is tricuspid. There is mild calcification of the aortic valve. Aortic valve regurgitation is mild. Aortic regurgitation PHT measures 540 msec. No aortic stenosis is present. Pulmonic Valve: The pulmonic valve was normal in structure. Pulmonic valve regurgitation is trivial. No evidence of pulmonic stenosis. Aorta: The aortic root is normal in size and structure. Venous: The inferior vena cava is normal in size with greater than 50% respiratory variability, suggesting right atrial pressure of 3 mmHg. IAS/Shunts: No atrial level shunt detected by color flow Doppler.  LEFT VENTRICLE PLAX 2D LVIDd:         4.00 cm   Diastology LVIDs:         2.70 cm   LV e' medial:    5.55 cm/s LV PW:         1.00 cm   LV E/e' medial:  11.4 LV IVS:        1.20 cm   LV e' lateral:   6.42 cm/s LVOT diam:     2.20 cm   LV E/e' lateral: 9.9 LV SV:         78 LV SV Index:   45        2D Longitudinal Strain LVOT Area:     3.80 cm  2D Strain GLS Avg:     -16.7 %  RIGHT VENTRICLE RV S prime:     10.30 cm/s TAPSE (M-mode): 1.2 cm LEFT ATRIUM             Index        RIGHT ATRIUM          Index LA diam:        3.20 cm 1.87 cm/m   RA Area:     6.79 cm LA Vol (A2C):   49.6 ml 28.93 ml/m  RA Volume:   10.20 ml 5.95 ml/m LA Vol (A4C):   50.2 ml 29.28 ml/m LA Biplane Vol: 54.0 ml 31.50 ml/m  AORTIC VALVE LVOT Vmax:   83.00 cm/s LVOT Vmean:  59.700 cm/s LVOT VTI:    0.205 m AI PHT:      540 msec  AORTA Ao  Root  diam: 3.50 cm Ao Asc diam:  2.90 cm MITRAL VALVE               TRICUSPID VALVE MV Area (PHT): 3.68 cm    TR Peak grad:   13.8 mmHg MV Decel Time: 206 msec    TR Vmax:        186.00 cm/s MV E velocity: 63.40 cm/s MV A velocity: 83.10 cm/s  SHUNTS MV E/A ratio:  0.76        Systemic VTI:  0.20 m                            Systemic Diam: 2.20 cm Glori Bickers MD Electronically signed by Glori Bickers MD Signature Date/Time: 09/27/2021/9:46:49 AM    Final        Assessment/Plan 1. Brain metastasis [facility])  Ms. 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  Motor function has returned over past several days, not yet at baseline prior to seizure.  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  Due to refractory clinical/radiographic behavior, poor response to steroids, recommended re-initiating avastin 52m/kg IV q3 weeks x3 cycles.  This will be given in addition to Enhertu administered by Dr. GLindi Adie  She is agreeable with this plna.  In the meantime, decadron should be decreased to 424mBID.  Will recommend continuing Keppra 150039mID.  For insomnia 2/2 deacdron, can dose Trazodone 31m81m until steroids are drawn down.  We appreciate the opportunity to participate in the care of CaroSmrithi Pigfordddy.    We ask that CaroKIANI WURTZELurn to clinic in 2 months with MRI brain following 3rd cycle of avastin, as discussed.  We will also give her a call next week to continue decadron dose adjustments.    All questions were answered. The patient knows to call the clinic with any problems, questions or concerns. No barriers to learning were detected.  I have spent a total of 40 minutes of face-to-face and non-face-to-face time, excluding clinical staff time, preparing to see patient, ordering tests and/or medications, counseling the patient, and independently interpreting  results and communicating results to the patient/family/caregiver    ZachVentura Riley Medical Director of Neuro-Oncology [facility] CenterWeslEufaula12/22 3:06 PM

## 2021-09-27 NOTE — Progress Notes (Signed)
  Echocardiogram 2D Echocardiogram has been performed.  [name] Riley M 09/27/2021, 9:27 AM

## 2021-09-27 NOTE — Progress Notes (Signed)
DISCONTINUE ON PATHWAY REGIMEN - Breast     A cycle is every 21 days:     Trastuzumab-xxxx      Trastuzumab-xxxx   **Always confirm dose/schedule in your pharmacy ordering system**  REASON: Disease Progression PRIOR TREATMENT: [id number]: Trastuzumab 8/6 mg/kg q21 Days Until Unacceptable Toxicity or Progression TREATMENT RESPONSE: Stable Disease (SD)  START ON PATHWAY REGIMEN - Breast     A cycle is every 21 days:     Fam-trastuzumab deruxtecan-nxki   **Always confirm dose/schedule in your pharmacy ordering system**  Patient Characteristics: Distant Metastases or Locoregional Recurrent Disease - Unresected or Locally Advanced Unresectable Disease Progressing after Neoadjuvant and Local Therapies, HER2 Positive, ER Positive, Chemotherapy + HER2-Targeted Therapy, Fourth Line and Beyond,  Fam-trastuzumab Deruxtecan or Tucatinib-based Therapy Indicated Therapeutic Status: Distant Metastases HER2 Status: Positive (+) ER Status: Positive (+) PR Status: Positive (+) Line of Therapy: Fourth Line and Beyond Therapy Indicated: Fam-trastuzumab Deruxtecan or Tucatinib-based Therapy Indicated Intent of Therapy: Non-Curative / Palliative Intent, Discussed with Patient

## 2021-09-28 ENCOUNTER — Telehealth: Payer: Self-pay | Admitting: Internal Medicine

## 2021-09-28 ENCOUNTER — Telehealth: Payer: Self-pay | Admitting: Hematology and Oncology

## 2021-09-28 NOTE — Telephone Encounter (Signed)
Scheduled appointment per 12/12 los. Patient aware.

## 2021-09-28 NOTE — Telephone Encounter (Signed)
Scheduled per 12/12 los, pt is aware

## 2021-09-30 ENCOUNTER — Other Ambulatory Visit: Payer: Self-pay | Admitting: Pharmacist

## 2021-10-04 ENCOUNTER — Inpatient Hospital Stay: Payer: Medicare Other | Admitting: Internal Medicine

## 2021-10-04 ENCOUNTER — Emergency Department (HOSPITAL_COMMUNITY): Payer: Medicare Other

## 2021-10-04 ENCOUNTER — Other Ambulatory Visit: Payer: Self-pay

## 2021-10-04 ENCOUNTER — Observation Stay (HOSPITAL_COMMUNITY)
Admission: EM | Admit: 2021-10-04 | Discharge: 2021-10-05 | Disposition: A | Discharge status: Home / Self Care | Payer: Medicare Other | Attending: Internal Medicine | Admitting: Internal Medicine

## 2021-10-04 ENCOUNTER — Encounter (HOSPITAL_COMMUNITY): Payer: Self-pay

## 2021-10-04 ENCOUNTER — Other Ambulatory Visit (HOSPITAL_COMMUNITY): Payer: Self-pay

## 2021-10-04 ENCOUNTER — Ambulatory Visit: Payer: Medicare Other | Admitting: Internal Medicine

## 2021-10-04 DIAGNOSIS — R569 Unspecified convulsions: Secondary | ICD-10-CM | POA: Diagnosis present

## 2021-10-04 DIAGNOSIS — Z91040 Latex allergy status: Secondary | ICD-10-CM | POA: Diagnosis not present

## 2021-10-04 DIAGNOSIS — G8384 Todd's paralysis (postepileptic): Secondary | ICD-10-CM | POA: Diagnosis not present

## 2021-10-04 DIAGNOSIS — C50412 Malignant neoplasm of upper-outer quadrant of left female breast: Secondary | ICD-10-CM | POA: Diagnosis not present

## 2021-10-04 DIAGNOSIS — I2510 Atherosclerotic heart disease of native coronary artery without angina pectoris: Secondary | ICD-10-CM | POA: Insufficient documentation

## 2021-10-04 DIAGNOSIS — Z20822 Contact with and (suspected) exposure to covid-19: Secondary | ICD-10-CM | POA: Diagnosis not present

## 2021-10-04 DIAGNOSIS — G40909 Epilepsy, unspecified, not intractable, without status epilepticus: Principal | ICD-10-CM | POA: Insufficient documentation

## 2021-10-04 DIAGNOSIS — E039 Hypothyroidism, unspecified: Secondary | ICD-10-CM | POA: Diagnosis not present

## 2021-10-04 DIAGNOSIS — Z79899 Other long term (current) drug therapy: Secondary | ICD-10-CM | POA: Diagnosis not present

## 2021-10-04 DIAGNOSIS — I10 Essential (primary) hypertension: Secondary | ICD-10-CM | POA: Insufficient documentation

## 2021-10-04 DIAGNOSIS — R2689 Other abnormalities of gait and mobility: Secondary | ICD-10-CM | POA: Diagnosis not present

## 2021-10-04 DIAGNOSIS — E782 Mixed hyperlipidemia: Secondary | ICD-10-CM | POA: Diagnosis present

## 2021-10-04 DIAGNOSIS — Z853 Personal history of malignant neoplasm of breast: Secondary | ICD-10-CM | POA: Diagnosis not present

## 2021-10-04 LAB — COMPREHENSIVE METABOLIC PANEL
ALT: 91 U/L — ABNORMAL HIGH (ref 0–44)
AST: 117 U/L — ABNORMAL HIGH (ref 15–41)
Albumin: 3 g/dL — ABNORMAL LOW (ref 3.5–5.0)
Alkaline Phosphatase: 327 U/L — ABNORMAL HIGH (ref 38–126)
Anion gap: 10 (ref 5–15)
BUN: 25 mg/dL — ABNORMAL HIGH (ref 8–23)
CO2: 27 mmol/L (ref 22–32)
Calcium: 9 mg/dL (ref 8.9–10.3)
Chloride: 101 mmol/L (ref 98–111)
Creatinine, Ser: 0.59 mg/dL (ref 0.44–1.00)
GFR, Estimated: >60 mL/min (ref >60)
Glucose, Bld: 84 mg/dL (ref 70–99)
Potassium: 3.6 mmol/L (ref 3.5–5.1)
Sodium: 138 mmol/L (ref 135–145)
Total Bilirubin: 0.6 mg/dL (ref 0.3–1.2)
Total Protein: 5.9 g/dL — ABNORMAL LOW (ref 6.5–8.1)

## 2021-10-04 LAB — CBC WITH DIFFERENTIAL/PLATELET
Abs Immature Granulocytes: 0.2 10*3/uL — ABNORMAL HIGH (ref 0.00–0.07)
Basophils Absolute: 0 10*3/uL (ref 0.0–0.1)
Basophils Relative: 1 %
Eosinophils Absolute: 0 10*3/uL (ref 0.0–0.5)
Eosinophils Relative: 1 %
HCT: 31.7 % — ABNORMAL LOW (ref 36.0–46.0)
Hemoglobin: 10.8 g/dL — ABNORMAL LOW (ref 12.0–15.0)
Immature Granulocytes: 5 %
Lymphocytes Relative: 28 %
Lymphs Abs: 1.2 10*3/uL (ref 0.7–4.0)
MCH: 37.6 pg — ABNORMAL HIGH (ref 26.0–34.0)
MCHC: 34.1 g/dL (ref 30.0–36.0)
MCV: 110.5 fL — ABNORMAL HIGH (ref 80.0–100.0)
Monocytes Absolute: 0.7 10*3/uL (ref 0.1–1.0)
Monocytes Relative: 17 %
Neutro Abs: 2.2 10*3/uL (ref 1.7–7.7)
Neutrophils Relative %: 48 %
Platelets: 71 10*3/uL — ABNORMAL LOW (ref 150–400)
RBC: 2.87 MIL/uL — ABNORMAL LOW (ref 3.87–5.11)
RDW: 14.8 % (ref 11.5–15.5)
WBC: 4.3 10*3/uL (ref 4.0–10.5)
nRBC: 0.7 % — ABNORMAL HIGH (ref 0.0–0.2)

## 2021-10-04 LAB — RESP PANEL BY RT-PCR (FLU A&B, COVID) ARPGX2
Influenza A by PCR: NEGATIVE
Influenza B by PCR: NEGATIVE
SARS Coronavirus 2 by RT PCR: NEGATIVE

## 2021-10-04 MED ORDER — ACETAMINOPHEN 325 MG PO TABS
650.0000 mg | ORAL_TABLET | Freq: Four times a day (QID) | ORAL | Status: DC | PRN
  Administered 2021-10-04: 18:00:00 650 mg via ORAL
  Filled 2021-10-04: qty 2

## 2021-10-04 MED ORDER — CHLORHEXIDINE GLUCONATE CLOTH 2 % EX PADS: 6.0000 | MEDICATED_PAD | Freq: Every day | CUTANEOUS | Status: DC

## 2021-10-04 MED ORDER — METOPROLOL SUCCINATE ER 50 MG PO TB24
100.0000 mg | ORAL_TABLET | Freq: Every day | ORAL | Status: DC
  Administered 2021-10-04 – 2021-10-05 (×2): 100 mg via ORAL
  Filled 2021-10-04 (×3): qty 2

## 2021-10-04 MED ORDER — ONDANSETRON HCL 4 MG/2ML IJ SOLN: 4.0000 mg | Freq: Four times a day (QID) | INTRAMUSCULAR | Status: DC | PRN

## 2021-10-04 MED ORDER — SODIUM CHLORIDE 0.9 % IV BOLUS
500.0000 mL | Freq: Once | INTRAVENOUS | Status: AC
  Administered 2021-10-04: 22:00:00 500 mL via INTRAVENOUS

## 2021-10-04 MED ORDER — LEVETIRACETAM 500 MG/5ML IV SOLN
2000.0000 mg | Freq: Two times a day (BID) | INTRAVENOUS | Status: DC
  Administered 2021-10-04 (×2): 2000 mg via INTRAVENOUS
  Filled 2021-10-04 (×5): qty 20

## 2021-10-04 MED ORDER — VITAMIN D 25 MCG (1000 UNIT) PO TABS
1000.0000 [IU] | ORAL_TABLET | Freq: Every day | ORAL | Status: DC
  Administered 2021-10-04 – 2021-10-05 (×2): 1000 [IU] via ORAL
  Filled 2021-10-04 (×2): qty 1

## 2021-10-04 MED ORDER — LEVOTHYROXINE SODIUM 100 MCG PO TABS
100.0000 ug | ORAL_TABLET | Freq: Every day | ORAL | Status: DC
  Administered 2021-10-05: 05:00:00 100 ug via ORAL
  Filled 2021-10-04: qty 1

## 2021-10-04 MED ORDER — ADULT MULTIVITAMIN W/MINERALS CH
1.0000 | ORAL_TABLET | Freq: Every day | ORAL | Status: DC
  Administered 2021-10-04 – 2021-10-05 (×2): 1 via ORAL
  Filled 2021-10-04 (×2): qty 1

## 2021-10-04 MED ORDER — LORAZEPAM 1 MG PO TABS: 1.0000 mg | ORAL_TABLET | Freq: Three times a day (TID) | ORAL | 0 refills | Status: DC | PRN

## 2021-10-04 MED ORDER — PANTOPRAZOLE SODIUM 40 MG PO TBEC
40.0000 mg | DELAYED_RELEASE_TABLET | Freq: Every day | ORAL | Status: DC
  Administered 2021-10-04 – 2021-10-05 (×2): 40 mg via ORAL
  Filled 2021-10-04 (×2): qty 1

## 2021-10-04 MED ORDER — FUROSEMIDE 20 MG PO TABS
20.0000 mg | ORAL_TABLET | Freq: Every day | ORAL | Status: DC
  Administered 2021-10-04 – 2021-10-05 (×2): 20 mg via ORAL
  Filled 2021-10-04 (×2): qty 1

## 2021-10-04 MED ORDER — ENOXAPARIN SODIUM 40 MG/0.4ML IJ SOSY
40.0000 mg | PREFILLED_SYRINGE | INTRAMUSCULAR | Status: DC
  Administered 2021-10-04: 18:00:00 40 mg via SUBCUTANEOUS
  Filled 2021-10-04: qty 0.4

## 2021-10-04 NOTE — ED Provider Notes (Signed)
[facility] EMERGENCY DEPARTMENT Provider Note   CSN: [id number] Arrival date & time: 10/04/21  0535     History Chief Complaint  Patient presents with   Seizures    [name] Riley is a 70 y.o. female.  Patient presents with chief complaint of left hand shaking.  She has a history of breast cancer with diffuse metastases including brain metastases.  She was recently admitted for persistent vasogenic edema of the brain with breakthrough seizures.  She has been on Keppra 1500 mg twice daily and Decadron 4 mg initially twice daily, but decreased to once daily by the neurologist this week.  Patient states that she was concerned that she had an episode of left hand shaking that lasted about 20 minutes.  She states that she had no other symptoms.  She was awake and alert during the episode.  She was given Versed on her ambulance ride over to the ER and the shaking has since stopped.  Otherwise no reports of other illnesses no fever no cough no vomiting or diarrhea.      Past Medical History:  Diagnosis Date   Allergy    Breast cancer ([place]) 08/25/2011   L , invasive ductal carcinoma, ER/PR +,HER2 -   Cancer ([place])    left breast cancer   Coronary artery disease 2001   Heart attack [facility]) 09/2000   Sep 25, 2000  --no intervention   History of chemotherapy comp. 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   Brain metastasis ([place]) 07/13/2018   Goals of care, counseling/discussion 06/05/2018   UTI (urinary tract infection) 05/31/2018    Sepsis secondary to UTI ([place]) 05/31/2018   Thrombocytopenia ([place]) 05/31/2018   Hypokalemia 05/31/2018   Port-A-Cath in place 03/23/2018   Elevated LFTs    Metastatic breast cancer ([place]) 06/29/2017   Nodule of skin of breast 03/06/2017   Abscess, abdomen 07/06/2016   Metastasis to bone ([place]) 06/01/2016   Pneumothorax on left 04/25/2016   Chest pain 10/23/2014   Radiation-induced dermatitis 07/27/2012   Hypertension    Breast cancer of upper-outer quadrant of left female breast ([place]) 09/01/2011   Hypothyroidism 06/12/2009   Mixed hyperlipidemia 06/12/2009   Essential hypertension 06/12/2009   Coronary atherosclerosis 06/12/2009   DIZZINESS 06/12/2009   PALPITATIONS 06/12/2009    Past Surgical History:  Procedure Laterality Date   ABDOMINAL HYSTERECTOMY  1998   TAH, oophorectomy   APPENDECTOMY  1970   BREAST CYST EXCISION Left 07/29/2020   Procedure: WIDE EXCISION OF LEFT MASTECTOMY INCISION;  Surgeon: Donnie Mesa, MD;  Location: [place];  Service: General;  Laterality: Left;   [place]   removal of benign lump in rt breast   BREAST SURGERY  11/14/11   right simple mastectomy, left mrm   INCISION AND DRAINAGE OF WOUND Left 07/01/2017   Procedure: IRRIGATION AND DEBRIDEMENT CHEST WALL ABSCESS;  Surgeon: Donnie Mesa, MD;  Location: [facility];  Service: General;  Laterality: Left;   MASS EXCISION Left 06/22/2017   Procedure: EXCISION OF CHEST WALL MASSES;  Surgeon: Donnie Mesa, MD;  Location: [place];  Service: General;  Laterality: Left;   MASTECTOMY Bilateral    for left breast cancer   OVARIAN CYST SURGERY Right 1970   PORT-A-CATH REMOVAL  08/30/2012   Procedure: REMOVAL PORT-A-CATH;  Surgeon: Imogene Burn. [name], MD;  Location: [place];  Service: General;  Laterality: Right;  port removal   PORTACATH PLACEMENT  11/14/2011   Procedure: INSERTION PORT-A-CATH;  Surgeon: Imogene Burn. [name], MD;  Location: [place];  Service: General;   Laterality: Right;   PORTACATH PLACEMENT N/A 04/25/2016   Procedure: INSERTION PORT-A-CATH LEFT CHEST;  Surgeon: Donnie Mesa, MD;  Location: [facility] [place] [facility];  Service: General;  Laterality: N/A;   skin tags  05/09/1997   left axillary left neck skin tags   TONSILLECTOMY  1968     OB History     Gravida  4   Para      Term      Preterm      AB  1   Living  3      SAB  1   IAB      Ectopic      Multiple      Live Births              Family History  Problem Relation Age of Onset   Hypertension Maternal Grandmother    Diabetes Maternal Grandmother    Cancer Father 80       lung cancer and Prostate Cancer   Hypertension Mother    Cancer Paternal Aunt        ovarian   Cancer Cousin        breast, paternal cousin   Cancer Paternal Uncle        stomach   Cancer Paternal Grandfather        Esophagus   Colon cancer Neg Hx     Social History   Tobacco Use   Smoking status: Never   Smokeless tobacco: Never  Vaping Use   Vaping Use: Never used  Substance Use Topics   Alcohol use: No   Drug use: No    Home Medications Prior to Admission medications   Medication Sig Start Date End Date Taking? Authorizing Provider  acetaminophen (TYLENOL) 500 MG tablet Take 1,000 mg by mouth every 6 (six) hours as needed for moderate pain.   Yes [provider]  Ascorbic Acid (VITAMIN C PO) Take by mouth.   Yes [provider]  cholecalciferol (VITAMIN D) 1000 units tablet Take 1 tablet (1,000 Units total) by mouth daily. 04/13/18  Yes Nicholas Lose, MD  dexamethasone (DECADRON) 4 MG tablet Take 1 tablet (4 mg total) by mouth daily. Start the day after chemotherapy for 2 days. 09/27/21  Yes Nicholas Lose, MD  furosemide (LASIX) 20 MG tablet TAKE ONE (1) TABLET BY MOUTH EVERY DAY 02/08/21  Yes Nicholas Lose, MD  levETIRAcetam (KEPPRA) 750 MG tablet Take 2 tablets (1,500 mg total) by mouth 2 (two) times daily. 09/25/21  Yes Barton Dubois, MD   levothyroxine (SYNTHROID) 100 MCG tablet TAKE ONE TABLET EACH MORNING BEFORE BREAKFAST 09/13/21  Yes [name], MD  metoprolol succinate (TOPROL-XL) 100 MG 24 hr tablet Take with or immediately following a meal. Patient taking differently: Take 100 mg by mouth daily. Take with or immediately following a meal. 08/12/21  Yes [name], MD  Multiple Vitamins-Minerals (MULTIVITAMIN WITH MINERALS) tablet Take 1 tablet by mouth daily.   Yes [provider]  ondansetron (ZOFRAN) 8 MG tablet Take 1 tablet (8 mg total) by mouth 2 (two) times daily as needed for refractory nausea / vomiting. Start on day 3 after chemo. 09/27/21  Yes Nicholas Lose, MD  pantoprazole (PROTONIX) 40 MG tablet Take 1 tablet (40 mg total) by mouth daily. 09/25/21 09/25/22 Yes Barton Dubois, MD  prochlorperazine (COMPAZINE) 10 MG tablet Take 1 tablet (10 mg total) by mouth every 6 (six) hours as needed (Nausea or vomiting). 09/27/21  Yes Nicholas Lose, MD  traZODone (DESYREL) 50 MG tablet Take 1 tablet (50 mg total) by mouth at bedtime. 09/27/21  Yes Vaslow, Acey Lav, MD  valsartan (DIOVAN) 80 MG tablet TAKE ONE (1) TABLET EACH DAY 08/12/21  Yes [name], MD  dexamethasone (DECADRON) 4 MG tablet Take 1 tablet (4 mg total) by mouth 2 (two) times daily with a meal. Patient not taking: Reported on 10/04/2021 09/25/21   Barton Dubois, MD  lidocaine-prilocaine (EMLA) cream APPLY TOPICALLY AS NEEDED FOR PORT ACCESS Patient not taking: Reported on 10/04/2021 01/15/21   Harle Stanford., PA-C  lidocaine-prilocaine (EMLA) cream Apply to affected area once Patient not taking: Reported on 10/04/2021 09/27/21   Nicholas Lose, MD  ondansetron (ZOFRAN-ODT) 4 MG disintegrating tablet TAKE 1 TABLET EVERY 6 HOURS AS NEEDED FOR NAUSEA AND VOMITING Patient not taking: Reported on 10/04/2021 05/07/21   Nicholas Lose, MD  potassium chloride SA (KLOR-CON) 20 MEQ tablet Take 1 tablet (20 mEq total) by mouth 2 (two) times daily for  7 days. 03/29/21 09/27/21  Dorie Rank, MD  rosuvastatin (CRESTOR) 5 MG tablet Take 1 tablet (5 mg total) by mouth daily. (new directions) Patient not taking: Reported on 09/27/2021 08/12/21   [name], MD  triamcinolone (KENALOG) 0.025 % ointment Apply 1 application topically 2 (two) times daily. Patient not taking: Reported on 10/04/2021 08/07/20   Nicholas Lose, MD    Allergies    Cantaloupe extract allergy skin test, Contrast media [iodinated diagnostic agents], Pravastatin, Zosyn [piperacillin sod-tazobactam so], and Latex  Review of Systems   Review of Systems  Constitutional:  Negative for fever.  HENT:  Negative for ear pain.   Eyes:  Negative for pain.  Respiratory:  Negative for cough.   Cardiovascular:  Negative for chest pain.  Gastrointestinal:  Negative for abdominal pain.  Genitourinary:  Negative for flank pain.  Musculoskeletal:  Negative for back pain.  Skin:  Negative for rash.  Neurological:  Negative for headaches.   Physical Exam Updated Vital Signs BP (!) 148/76    Pulse 80    Temp 98.4 F (36.9 C) (Axillary)    Resp 12    Ht 5' 8"  (1.727 m)    Wt 64 kg    SpO2 99%    BMI 21.45 kg/m   Physical Exam Constitutional:      General: She is not in acute distress.    Appearance: Normal appearance.  HENT:     Head: Normocephalic.     Nose: Nose normal.  Eyes:     Extraocular Movements: Extraocular movements intact.  Cardiovascular:     Rate and Rhythm: Normal rate.  Pulmonary:     Effort: Pulmonary effort is normal.  Musculoskeletal:        General: Normal range of motion.     Cervical back: Normal range of motion.  Neurological:     General: No focal deficit present.  Mental Status: She is alert and oriented to person, place, and time. Mental status is at baseline.     Cranial Nerves: No cranial nerve deficit.     Motor: No weakness.     Comments: 5/5 strength all extremities.    ED Results / Procedures / Treatments   Labs (all labs  ordered are listed, but only abnormal results are displayed) Labs Reviewed  CBC WITH DIFFERENTIAL/PLATELET - Abnormal; Notable for the following components:      Result Value   RBC 2.87 (*)    Hemoglobin 10.8 (*)    HCT 31.7 (*)    MCV 110.5 (*)    MCH 37.6 (*)    Platelets 71 (*)    nRBC 0.7 (*)    Abs Immature Granulocytes 0.20 (*)    All other components within normal limits  COMPREHENSIVE METABOLIC PANEL - Abnormal; Notable for the following components:   BUN 25 (*)    Total Protein 5.9 (*)    Albumin 3.0 (*)    AST 117 (*)    ALT 91 (*)    Alkaline Phosphatase 327 (*)    All other components within normal limits  RESP PANEL BY RT-PCR (FLU A&B, COVID) [id number]  URINALYSIS, ROUTINE W REFLEX MICROSCOPIC    EKG EKG Interpretation  Date/Time:  Monday October 04 2021 05:57:53 EST Ventricular Rate:  81 PR Interval:  144 QRS Duration: 92 QT Interval:  417 QTC Calculation: 485 R Axis:   -49 Text Interpretation: Sinus rhythm Probable left atrial enlargement Left anterior fascicular block Confirmed by Thamas Jaegers ([id number]) on 10/04/2021 6:13:58 AM  Radiology CT Head Wo Contrast  Result Date: 10/04/2021 CLINICAL DATA:  Mental status changes.  Seizure. EXAM: CT HEAD WITHOUT CONTRAST TECHNIQUE: Contiguous axial images were obtained from the base of the skull through the vertex without intravenous contrast. COMPARISON:  MRI 09/24/2021.  CT 09/24/2021 FINDINGS: Brain: Vasogenic edema again noted in the right frontal parietal region, similar to prior and consistent with the known metastatic disease. No evidence for hydrocephalus or midline shift. No abnormal extra-axial fluid collection. Vascular: No hyperdense vessel or unexpected calcification. Skull: Numerous sclerotic bone lesions identified in the skull in C1 vertebral body including a dominant confluent lesion right frontal parietal region up into the vertex. Sinuses/Orbits: The visualized paranasal sinuses and mastoid air cells are  clear. Visualized portions of the globes and intraorbital fat are unremarkable. Other: None. IMPRESSION: 1. No substantial change in appearance of vasogenic edema in the right frontal parietal region consistent with known metastatic disease. No evidence for acute intracranial hemorrhage or midline shift. 2. Sclerotic bone lesions in the skull base compatible with known metastatic disease. Electronically Signed   By: Misty Stanley M.D.   On: 10/04/2021 07:21   DG Chest Port 1 View  Result Date: 10/04/2021 CLINICAL DATA:  Lethargy and altered mental status. EXAM: PORTABLE CHEST 1 VIEW COMPARISON:  Portable chest 09/23/2021. FINDINGS: Left IJ port catheter insertion with tip in the distal SVC is unchanged. The cardiac size is normal. There is calcification of the transverse aorta. Pulmonary vasculature is normal. The lungs are clear of infiltrate. Widespread skeletal metastatic osteoblastic disease is redemonstrated. IMPRESSION: No evidence of acute chest disease. Widespread osseous sclerotic metastatic disease. Electronically Signed   By: Telford Nab M.D.   On: 10/04/2021 06:22    Procedures Procedures   Medications Ordered in ED Medications - No data to display  ED Course  I have reviewed the triage vital signs and the  nursing notes.  Pertinent labs & imaging results that were available during my care of the patient were reviewed by me and considered in my medical decision making (see chart for details).    MDM Rules/Calculators/A&P                         Seizure activity seems to have ceased after Versed given in the ambulance.  Patient is awake and alert and oriented to time person and place.  Moving all extremities with 5/5 strength.  Labs are sent is unremarkable white count normal hemoglobin normal chemistry, at baseline levels.  CT imaging of the brain pursued.  No acute changes per radiology.  Patient is back to her normal baseline mental status.  Family states that her seizure  episodes at home consist only of left hand shaking and she was fully awake and oriented during her episode which appears more consistent with partial seizure.  Given CT imaging of the brain is unchanged and patient is back at her baseline with no additional events, will be discharged home.  Given a prescription of Ativan to take at home should her shaking episodes return.  Advised her to call her neurologist for outpatient follow-up today.  Advising immediate return for worsening symptoms or any additional concerns.    Final Clinical Impression(s) / ED Diagnoses Final diagnoses:  Partial seizure [facility])    Rx / [place] Orders ED Discharge Orders     None        Luna Fuse, MD 10/04/21 [phone]

## 2021-10-04 NOTE — ED Triage Notes (Signed)
Pt to ED by EMS from home with c/o a witnessed seizure lasting approx 20 minutes. Pt was administered 2.5mg  versed IM + another 2.5 IV. Pt also had a brief episode of seizure like activity in the ambulance. Pt arrives postictal respirations are even and unlabored, seizure pads placed immediately on arrival. VSS.

## 2021-10-04 NOTE — H&P (Signed)
History and Physical  [name] Riley SLP:[id number] DOB: Nov 03, 1950 DOA: 10/04/2021   PCP: [name], MD   Patient coming from: Home  Chief Complaint: seizure  HPI:  [name] Riley is a 70 y.o. female with medical history of seizure disorder, breast cancer with brain metastasis, hypertension, hyperlipidemia, hypothyroidism, coronary artery disease presenting with focal seizures.  The patient states that she has a sensation like "waves in my stomach" prior to the initiation of her seizure.  This was followed by shaking her left upper extremity which is subsequently resulting in whole body shaking.  The patient was recently discharged from the hospital after a stay from 09/23/2021 to 09/25/2021 for similar episode.  She was discharged home at that time with an increase of her Keppra dose up to 1500 mg twice daily and her dexamethasone was increased to 4 mg twice daily.  She states that she has not had any seizures since discharge home.  She followed up with Dr. Mickeal Skinner on 09/27/2021 in the office.  At that time, she was continued on her discharge dose of Keppra, but her dexamethasone was decreased to 4 mg once daily.  She denies any new medications.  She denies any fever, chills, headache, neck pain, chest pain, shortness of breath, nausea, vomiting, diarrhea, abdominal pain. She states that she was getting up in the middle the night around 4 AM when she had recurrent episode of left arm shaking.  EMS was subsequently contacted.  The patient was given Versed 2.5 mg IM and subsequently another 2.5 mg IV with termination of her left arm shaking.  During the entire episode, the patient was awake and alert and conversant and following commands. In the emergency department, the patient was afebrile and hemodynamically stable with oxygen saturation 100% on room air.  BMP showed sodium 138, potassium 3.6, serum creatinine 0.59.  LFTs or AST 117, ALT 91, alk phosphatase 327, total bilirubin 0.6.  WBC 4.3,  hemoglobin 10.8, platelets 71,000 The patient was noted to have significant left upper extremity and left lower extremity weakness.  She was having difficulty ambulating.  As result, admission was recommended for further treatment.  Dr. Mickeal Skinner was contacted by EDP, and he recommended increasing Keppra to 2000 mg twice daily and continuing Decadron at his current dose.  He felt that there was no need for repeat MRI of the brain as the patient has had several MRIs of the brain recently.  Assessment/Plan: Seizure disorder with Todd's paralysis -Increase Keppra dose to 2000 mg twice daily -continue current decadron dose 4 mg daily -PT eval  Metastatic breast cancer to the brain and liver -follow Dr. Lindi Adie Dr. Mickeal Skinner -therapy switched to [place] -09/24/2021 MR brain--progression of vasogenic edema right frontal parietal lobe; several associated lesions present in the right frontal parietal lobe.  Diffuse mets to calvarium and cervical spine.  Hypothyroidism -Continue Synthroid  Hypertension -Continue metoprolol succinate -Holding valsartan temporarily  Hyperlipidemia -Holding statin for elevated LFTs temporarily  Generalized weakness -PT evaluation       Past Medical History:  Diagnosis Date   Allergy    Breast cancer ([place]) 08/25/2011   L , invasive ductal carcinoma, ER/PR +,HER2 -   Cancer ([place])    left breast cancer   Coronary artery disease 2001   Heart attack [facility]) 09/2000   Sep 25, 2000  --no intervention   History of chemotherapy comp. 08/22/2012   4 cycles of FEC and $ cycles of Taxotere   Hyperlipidemia  Hypertension    Hypothyroidism    PONV (postoperative nausea and vomiting)    gets sick from anesthesia   Status post radiation therapy 07/09/12 - 08/22/2012   Left Breast, 60.4 gray   Past Surgical History:  Procedure Laterality Date   ABDOMINAL HYSTERECTOMY  1998   TAH, oophorectomy   APPENDECTOMY  1970   BREAST CYST EXCISION Left 07/29/2020   Procedure:  WIDE EXCISION OF LEFT MASTECTOMY INCISION;  Surgeon: Donnie Mesa, MD;  Location: [place];  Service: General;  Laterality: Left;   [place]   removal of benign lump in rt breast   BREAST SURGERY  11/14/11   right simple mastectomy, left mrm   INCISION AND DRAINAGE OF WOUND Left 07/01/2017   Procedure: IRRIGATION AND DEBRIDEMENT CHEST WALL ABSCESS;  Surgeon: Donnie Mesa, MD;  Location: [facility];  Service: General;  Laterality: Left;   MASS EXCISION Left 06/22/2017   Procedure: EXCISION OF CHEST WALL MASSES;  Surgeon: Donnie Mesa, MD;  Location: [place];  Service: General;  Laterality: Left;   MASTECTOMY Bilateral    for left breast cancer   OVARIAN CYST SURGERY Right [place]  08/30/2012   Procedure: REMOVAL PORT-A-CATH;  Surgeon: Imogene Burn. [name], MD;  Location: [place];  Service: General;  Laterality: Right;  port removal   PORTACATH PLACEMENT  11/14/2011   Procedure: INSERTION PORT-A-CATH;  Surgeon: Imogene Burn. [name], MD;  Location: [place];  Service: General;  Laterality: Right;   PORTACATH PLACEMENT N/A 04/25/2016   Procedure: INSERTION PORT-A-CATH LEFT CHEST;  Surgeon: Donnie Mesa, MD;  Location: [place];  Service: General;  Laterality: N/A;   skin tags  05/09/1997   left axillary left neck skin tags   TONSILLECTOMY  1968   Social History:  reports that she has never smoked. She has never used smokeless tobacco. She reports that she does not drink alcohol and does not use drugs.   Family History  Problem Relation Age of Onset   Hypertension Maternal Grandmother    Diabetes Maternal Grandmother    Cancer Father 35       lung cancer and Prostate Cancer   Hypertension Mother    Cancer Paternal Aunt        ovarian   Cancer Cousin        breast, paternal cousin   Cancer Paternal Uncle        stomach   Cancer Paternal Grandfather        Esophagus   Colon cancer Neg Hx      Allergies  Allergen  Reactions   Cantaloupe Extract Allergy Skin Test Shortness Of Breath   Contrast Media [Iodinated Diagnostic Agents] Shortness Of Breath and Rash   Pravastatin Other (See Comments)    Legs hurt   Zosyn [Piperacillin Sod-Tazobactam So] Rash and Other (See Comments)    Temperature increase, facial flushing   Latex Rash    Redness, itch      Prior to Admission medications   Medication Sig Start Date End Date Taking? Authorizing Provider  acetaminophen (TYLENOL) 500 MG tablet Take 1,000 mg by mouth every 6 (six) hours as needed for moderate pain.   Yes [provider]  Ascorbic Acid (VITAMIN C PO) Take by mouth.   Yes [provider]  cholecalciferol (VITAMIN D) 1000 units tablet Take 1 tablet (1,000 Units total) by mouth daily. 04/13/18  Yes Nicholas Lose, MD  dexamethasone (DECADRON) 4 MG tablet  Take 1 tablet (4 mg total) by mouth daily. Start the day after chemotherapy for 2 days. 09/27/21  Yes Nicholas Lose, MD  furosemide (LASIX) 20 MG tablet TAKE ONE (1) TABLET BY MOUTH EVERY DAY 02/08/21  Yes Nicholas Lose, MD  levETIRAcetam (KEPPRA) 750 MG tablet Take 2 tablets (1,500 mg total) by mouth 2 (two) times daily. 09/25/21  Yes Barton Dubois, MD  levothyroxine (SYNTHROID) 100 MCG tablet TAKE ONE TABLET EACH MORNING BEFORE BREAKFAST 09/13/21  Yes [name], MD  LORazepam (ATIVAN) 1 MG tablet Take 1 tablet (1 mg total) by mouth 3 (three) times daily as needed for up to 8 doses for seizure (Take 1 tablet by mouth if you have recurrent episodes of left hand shaking.). 10/04/21  Yes Hong, Greggory Brandy, MD  metoprolol succinate (TOPROL-XL) 100 MG 24 hr tablet Take with or immediately following a meal. Patient taking differently: Take 100 mg by mouth daily. Take with or immediately following a meal. 08/12/21  Yes Stacks, Cletus Gash, MD  Multiple Vitamins-Minerals (MULTIVITAMIN WITH MINERALS) tablet Take 1 tablet by mouth daily.   Yes [provider]  ondansetron (ZOFRAN) 8 MG  tablet Take 1 tablet (8 mg total) by mouth 2 (two) times daily as needed for refractory nausea / vomiting. Start on day 3 after chemo. 09/27/21  Yes Nicholas Lose, MD  pantoprazole (PROTONIX) 40 MG tablet Take 1 tablet (40 mg total) by mouth daily. 09/25/21 09/25/22 Yes Barton Dubois, MD  prochlorperazine (COMPAZINE) 10 MG tablet Take 1 tablet (10 mg total) by mouth every 6 (six) hours as needed (Nausea or vomiting). 09/27/21  Yes Nicholas Lose, MD  traZODone (DESYREL) 50 MG tablet Take 1 tablet (50 mg total) by mouth at bedtime. 09/27/21  Yes Vaslow, Acey Lav, MD  valsartan (DIOVAN) 80 MG tablet TAKE ONE (1) TABLET EACH DAY 08/12/21  Yes [name], MD  dexamethasone (DECADRON) 4 MG tablet Take 1 tablet (4 mg total) by mouth 2 (two) times daily with a meal. Patient not taking: Reported on 10/04/2021 09/25/21   Barton Dubois, MD  lidocaine-prilocaine (EMLA) cream APPLY TOPICALLY AS NEEDED FOR PORT ACCESS Patient not taking: Reported on 10/04/2021 01/15/21   Harle Stanford., PA-C  lidocaine-prilocaine (EMLA) cream Apply to affected area once Patient not taking: Reported on 10/04/2021 09/27/21   Nicholas Lose, MD  ondansetron (ZOFRAN-ODT) 4 MG disintegrating tablet TAKE 1 TABLET EVERY 6 HOURS AS NEEDED FOR NAUSEA AND VOMITING Patient not taking: Reported on 10/04/2021 05/07/21   Nicholas Lose, MD  potassium chloride SA (KLOR-CON) 20 MEQ tablet Take 1 tablet (20 mEq total) by mouth 2 (two) times daily for 7 days. 03/29/21 09/27/21  Dorie Rank, MD  rosuvastatin (CRESTOR) 5 MG tablet Take 1 tablet (5 mg total) by mouth daily. (new directions) Patient not taking: Reported on 09/27/2021 08/12/21   [name], MD  triamcinolone (KENALOG) 0.025 % ointment Apply 1 application topically 2 (two) times daily. Patient not taking: Reported on 10/04/2021 08/07/20   Nicholas Lose, MD    Review of Systems:  Constitutional:  No weight loss, night sweats, Fevers, chills, fatigue.  Head&Eyes: No headache.   No vision loss.  No eye pain or scotoma ENT:  No Difficulty swallowing,Tooth/dental problems,Sore throat,  No ear ache, post nasal drip,  Cardio-vascular:  No chest pain, Orthopnea, PND, swelling in lower extremities,  dizziness, palpitations  GI:  No  abdominal pain, nausea, vomiting, diarrhea, loss of appetite, hematochezia, melena, heartburn, indigestion, Resp:  No shortness of breath with exertion or  at rest. No cough. No coughing up of blood .No wheezing.No chest wall deformity  Skin:  no rash or lesions.  GU:  no dysuria, change in color of urine, no urgency or frequency. No flank pain.  Musculoskeletal:  No joint pain or swelling. No decreased range of motion. No back pain.  Psych:  No change in mood or affect.  Neurologic: No headache, no dysesthesia, no vision loss. No syncope  Physical Exam: Vitals:   10/04/21 0548 10/04/21 0630 10/04/21 0700 10/04/21 0850  BP:  (!) 148/76 (!) 150/91 127/69  Pulse:  80 81 78  Resp:  _0 Temp:      TempSrc:      SpO2:  99% 100% 99%  Weight: 64 kg     Height: _1  (1.727 m)      General:  A&O x 3, NAD, nontoxic, pleasant/cooperative Head/Eye: No conjunctival hemorrhage, no icterus, [name] Riley/AT, No nystagmus ENT:  No icterus,  No thrush, good dentition, no pharyngeal exudate Neck:  No masses, no lymphadenpathy, no bruits CV:  RRR, no rub, no gallop, no S3 Lung:  CTAB, good air movement, no wheeze, no rhonchi Abdomen: soft/NT, +BS, nondistended, no peritoneal signs Ext: No cyanosis, No rashes, No petechiae, No lymphangitis, No edema Neuro: CNII-XII intact, strength 4/5 in bilateral upper and lower extremities, no dysmetria  Labs on Admission:  Basic Metabolic Panel: Recent Labs  Lab 10/04/21 0554  NA 138  K 3.6  CL 101  CO2 27  GLUCOSE 84  BUN 25*  CREATININE 0.59  CALCIUM 9.0   Liver Function Tests: Recent Labs  Lab 10/04/21 0554  AST 117*  ALT 91*  ALKPHOS 327*  BILITOT 0.6  PROT 5.9*  ALBUMIN 3.0*   No  results for input(s): LIPASE, AMYLASE in the last 168 hours. No results for input(s): AMMONIA in the last 168 hours. CBC: Recent Labs  Lab 10/04/21 0554  WBC 4.3  NEUTROABS 2.2  HGB 10.8*  HCT 31.7*  MCV 110.5*  PLT 71*   Coagulation Profile: No results for input(s): INR, PROTIME in the last 168 hours. Cardiac Enzymes: No results for input(s): CKTOTAL, CKMB, CKMBINDEX, TROPONINI in the last 168 hours. BNP: Invalid input(s): POCBNP CBG: No results for input(s): GLUCAP in the last 168 hours. Urine analysis:    Component Value Date/Time   COLORURINE YELLOW 09/25/2021 0354   APPEARANCEUR CLEAR 09/25/2021 0354   APPEARANCEUR Clear 04/26/2021 1128   LABSPEC 1.010 09/25/2021 0354   PHURINE 6.5 09/25/2021 0354   GLUCOSEU NEGATIVE 09/25/2021 0354   HGBUR TRACE (A) 09/25/2021 0354   BILIRUBINUR NEGATIVE 09/25/2021 0354   BILIRUBINUR Negative 04/26/2021 1128   KETONESUR NEGATIVE 09/25/2021 0354   PROTEINUR NEGATIVE 09/25/2021 0354   NITRITE NEGATIVE 09/25/2021 0354   LEUKOCYTESUR NEGATIVE 09/25/2021 0354   Sepsis Labs: _2 (procalcitonin:4,lacticidven:4) ) Recent Results (from the past 240 hour(s))  Resp Panel by RT-PCR (Flu A&B, Covid) Nasopharyngeal Swab     Status: None   Collection Time: 10/04/21  6:03 AM   Specimen: Nasopharyngeal Swab; Nasopharyngeal(NP) swabs in vial transport medium  Result Value Ref Range Status   SARS Coronavirus 2 by RT PCR NEGATIVE NEGATIVE Final    Comment: (NOTE) SARS-CoV-2 target nucleic acids are NOT DETECTED.  The SARS-CoV-2 RNA is generally detectable in upper respiratory specimens during the acute phase of infection. The lowest concentration of SARS-CoV-2 viral copies this assay can detect is 138 copies/mL. A negative result does not preclude SARS-Cov-2 infection and should not be used  as the sole basis for treatment or other patient management decisions. A negative result may occur with  improper specimen collection/handling,  submission of specimen other than nasopharyngeal swab, presence of viral mutation(s) within the areas targeted by this assay, and inadequate number of viral copies(<138 copies/mL). A negative result must be combined with clinical observations, patient history, and epidemiological information. The expected result is Negative.  Fact Sheet for Patients:  EntrepreneurPulse.com.au  Fact Sheet for Healthcare Providers:  IncredibleEmployment.be  This test is no t yet approved or cleared by the [place] FDA and  has been authorized for detection and/or diagnosis of SARS-CoV-2 by FDA under an Emergency Use Authorization (EUA). This EUA will remain  in effect (meaning this test can be used) for the duration of the COVID-19 declaration under Section 564(b)(1) of the Act, 21 U.S.C.section 360bbb-3(b)(1), unless the authorization is terminated  or revoked sooner.       Influenza A by PCR NEGATIVE NEGATIVE Final   Influenza B by PCR NEGATIVE NEGATIVE Final    Comment: (NOTE) The Xpert Xpress SARS-CoV-2/FLU/RSV plus assay is intended as an aid in the diagnosis of influenza from Nasopharyngeal swab specimens and should not be used as a sole basis for treatment. Nasal washings and aspirates are unacceptable for Xpert Xpress SARS-CoV-2/FLU/RSV testing.  Fact Sheet for Patients: EntrepreneurPulse.com.au  Fact Sheet for Healthcare Providers: IncredibleEmployment.be  This test is not yet approved or cleared by the [place] FDA and has been authorized for detection and/or diagnosis of SARS-CoV-2 by FDA under an Emergency Use Authorization (EUA). This EUA will remain in effect (meaning this test can be used) for the duration of the COVID-19 declaration under Section 564(b)(1) of the Act, 21 U.S.C. section 360bbb-3(b)(1), unless the authorization is terminated or revoked.  Performed at [facility], [address], [place], [place] [address]      Radiological Exams on Admission: CT Head Wo Contrast  Result Date: 10/04/2021 CLINICAL DATA:  Mental status changes.  Seizure. EXAM: CT HEAD WITHOUT CONTRAST TECHNIQUE: Contiguous axial images were obtained from the base of the skull through the vertex without intravenous contrast. COMPARISON:  MRI 09/24/2021.  CT 09/24/2021 FINDINGS: Brain: Vasogenic edema again noted in the right frontal parietal region, similar to prior and consistent with the known metastatic disease. No evidence for hydrocephalus or midline shift. No abnormal extra-axial fluid collection. Vascular: No hyperdense vessel or unexpected calcification. Skull: Numerous sclerotic bone lesions identified in the skull in C1 vertebral body including a dominant confluent lesion right frontal parietal region up into the vertex. Sinuses/Orbits: The visualized paranasal sinuses and mastoid air cells are clear. Visualized portions of the globes and intraorbital fat are unremarkable. Other: None. IMPRESSION: 1. No substantial change in appearance of vasogenic edema in the right frontal parietal region consistent with known metastatic disease. No evidence for acute intracranial hemorrhage or midline shift. 2. Sclerotic bone lesions in the skull base compatible with known metastatic disease. Electronically Signed   By: Misty Stanley M.D.   On: 10/04/2021 07:21   DG Chest Port 1 View  Result Date: 10/04/2021 CLINICAL DATA:  Lethargy and altered mental status. EXAM: PORTABLE CHEST 1 VIEW COMPARISON:  Portable chest 09/23/2021. FINDINGS: Left IJ port catheter insertion with tip in the distal SVC is unchanged. The cardiac size is normal. There is calcification of the transverse aorta. Pulmonary vasculature is normal. The lungs are clear of infiltrate. Widespread skeletal metastatic osteoblastic disease is redemonstrated. IMPRESSION: No evidence of acute chest disease. Widespread osseous sclerotic  metastatic disease.  Electronically Signed   By: Telford Nab M.D.   On: 10/04/2021 06:22       Time spent:60 minutes Code Status:   FULL Family Communication: spouse update at bedside 12/19 Disposition Plan: expect 1-2 day hospitalization Consults called: Vaslow  DVT Prophylaxis: [place] Lovenox  Orson Eva, DO  [facility] Pager [phone]  If 7PM-7AM, please contact night-coverage [email or web address] Password [email or web address] 10/04/2021, 10:53 AM

## 2021-10-04 NOTE — Discharge Instructions (Addendum)
Call your neurologist today to inform that you are in the ER and to arrange follow-up for this week.  Take Ativan as written should your left hand shaking episode return.  Return back to the ER if you have worsening seizures or loss of consciousness or become unresponsive.

## 2021-10-04 NOTE — ED Notes (Signed)
Pt given a ginger ale at this time.

## 2021-10-04 NOTE — ED Provider Notes (Signed)
Patient is unable to ambulate, can only take small shuffling steps, and has significant weakness of left upper and lower arm.  Her son at bedside reports that this is a profound change from her baseline including her mobility even yesterday.  I suspect this is related to her brain metastasis, for which she is already taking steroids and Keppra. I have paged oncology (9 am).  940 am - I spoke to Dr Mickeal Skinner neuro oncology who is very familiar with the patient's history, and advised that this left-sided weakness would be highly consistent with a Todd's paralysis, and he would expect his symptoms to significantly improve over the next 24 hours.  He did advise increasing Keppra to 2000 mg BID, continuing decadron at its current dose.  He did not feel that she needed emergent repeat MRI imaging, she has had several in the past month.  I discussed this plan with the patient and her husband at bedside.  Her husband reports that he would feel better if she was kept in observation in the hospital overnight, as he is not able to provide sufficient care alone in the house for this level of weakness.  The patient is also agreeable to staying.  It appears that she has some improving strength in her left arm already according to her husband at bedside.  Paged hospitalist for admission.   [name], MD 10/04/21 [phone]

## 2021-10-05 DIAGNOSIS — C50412 Malignant neoplasm of upper-outer quadrant of left female breast: Secondary | ICD-10-CM | POA: Diagnosis not present

## 2021-10-05 DIAGNOSIS — G8384 Todd's paralysis (postepileptic): Secondary | ICD-10-CM | POA: Diagnosis not present

## 2021-10-05 DIAGNOSIS — G40909 Epilepsy, unspecified, not intractable, without status epilepticus: Secondary | ICD-10-CM | POA: Diagnosis not present

## 2021-10-05 LAB — URINALYSIS, COMPLETE (UACMP) WITH MICROSCOPIC
Bilirubin Urine: NEGATIVE
Glucose, UA: NEGATIVE mg/dL
Hgb urine dipstick: NEGATIVE
Ketones, ur: NEGATIVE mg/dL
Nitrite: NEGATIVE
Protein, ur: NEGATIVE mg/dL
Specific Gravity, Urine: 1.02 (ref 1.005–1.030)
pH: 6.5 (ref 5.0–8.0)

## 2021-10-05 LAB — CBC
HCT: 31 % — ABNORMAL LOW (ref 36.0–46.0)
Hemoglobin: 10.4 g/dL — ABNORMAL LOW (ref 12.0–15.0)
MCH: 37 pg — ABNORMAL HIGH (ref 26.0–34.0)
MCHC: 33.5 g/dL (ref 30.0–36.0)
MCV: 110.3 fL — ABNORMAL HIGH (ref 80.0–100.0)
Platelets: 65 10*3/uL — ABNORMAL LOW (ref 150–400)
RBC: 2.81 MIL/uL — ABNORMAL LOW (ref 3.87–5.11)
RDW: 14.8 % (ref 11.5–15.5)
WBC: 3.7 10*3/uL — ABNORMAL LOW (ref 4.0–10.5)
nRBC: 1.9 % — ABNORMAL HIGH (ref 0.0–0.2)

## 2021-10-05 LAB — COMPREHENSIVE METABOLIC PANEL
ALT: 78 U/L — ABNORMAL HIGH (ref 0–44)
AST: 101 U/L — ABNORMAL HIGH (ref 15–41)
Albumin: 2.5 g/dL — ABNORMAL LOW (ref 3.5–5.0)
Alkaline Phosphatase: 299 U/L — ABNORMAL HIGH (ref 38–126)
Anion gap: 5 (ref 5–15)
BUN: 21 mg/dL (ref 8–23)
CO2: 27 mmol/L (ref 22–32)
Calcium: 8.2 mg/dL — ABNORMAL LOW (ref 8.9–10.3)
Chloride: 103 mmol/L (ref 98–111)
Creatinine, Ser: 0.62 mg/dL (ref 0.44–1.00)
GFR, Estimated: >60 mL/min (ref >60)
Glucose, Bld: 81 mg/dL (ref 70–99)
Potassium: 3.6 mmol/L (ref 3.5–5.1)
Sodium: 135 mmol/L (ref 135–145)
Total Bilirubin: 0.9 mg/dL (ref 0.3–1.2)
Total Protein: 5.3 g/dL — ABNORMAL LOW (ref 6.5–8.1)

## 2021-10-05 LAB — MAGNESIUM: Magnesium: 1.7 mg/dL (ref 1.7–2.4)

## 2021-10-05 MED ORDER — TRAZODONE HCL 50 MG PO TABS: 50.0000 mg | ORAL_TABLET | Freq: Every evening | ORAL | Status: DC | PRN

## 2021-10-05 MED ORDER — DEXAMETHASONE SODIUM PHOSPHATE 4 MG/ML IJ SOLN
4.0000 mg | Freq: Once | INTRAMUSCULAR | Status: AC
  Administered 2021-10-05: 12:00:00 4 mg via INTRAVENOUS
  Filled 2021-10-05: qty 1

## 2021-10-05 MED ORDER — LEVETIRACETAM 500 MG PO TABS
2000.0000 mg | ORAL_TABLET | Freq: Two times a day (BID) | ORAL | Status: DC
  Administered 2021-10-05: 12:00:00 2000 mg via ORAL
  Filled 2021-10-05: qty 4

## 2021-10-05 MED ORDER — LEVETIRACETAM 1000 MG PO TABS: 2000.0000 mg | ORAL_TABLET | Freq: Two times a day (BID) | ORAL | Status: DC

## 2021-10-05 MED ORDER — TRAZODONE HCL 50 MG PO TABS: 50.0000 mg | ORAL_TABLET | Freq: Every day | ORAL | 3 refills | Status: AC

## 2021-10-05 MED FILL — Dexamethasone Sodium Phosphate Inj 100 MG/10ML: INTRAMUSCULAR | Qty: 1 | Status: AC

## 2021-10-05 NOTE — Care Management Obs Status (Signed)
[facility] NOTIFICATION   Patient Details  Name: [name] Riley MRN: [id number] Date of Birth: 10-23-1950   Medicare Observation Status Notification Given:  Yes    Tommy Medal 10/05/2021, 1:45 PM

## 2021-10-05 NOTE — Discharge Summary (Signed)
Physician Discharge Summary  [name] Riley CLE:[id number] DOB: 05-Feb-1951 DOA: 10/04/2021  PCP: [name], MD  Admit date: 10/04/2021 Discharge date: 10/05/2021  Admitted From: Home Disposition:  Home  Recommendations for Outpatient Follow-up:  Follow up with PCP in 1-2 weeks Please obtain BMP/CBC in one week   Home Health: [place], OT, RN, SW   Discharge Condition: Stable CODE STATUS:FULL Diet recommendation: Heart Healthy    Brief/Interim Summary: 70 y.o. female with medical history of seizure disorder, breast cancer with brain metastasis, hypertension, hyperlipidemia, hypothyroidism, coronary artery disease presenting with focal seizures.  The patient states that she has a sensation like "waves in my stomach" prior to the initiation of her seizure.  This was followed by shaking her left upper extremity which is subsequently resulting in whole body shaking.  The patient was recently discharged from the hospital after a stay from 09/23/2021 to 09/25/2021 for similar episode.  She was discharged home at that time with an increase of her Keppra dose up to 1500 mg twice daily and her dexamethasone was increased to 4 mg twice daily.  She states that she has not had any seizures since discharge home.  She followed up with Dr. Mickeal Skinner on 09/27/2021 in the office.  At that time, she was continued on her discharge dose of Keppra, but her dexamethasone was decreased to 4 mg once daily.  She denies any new medications.  She denies any fever, chills, headache, neck pain, chest pain, shortness of breath, nausea, vomiting, diarrhea, abdominal pain. She states that she was getting up in the middle the night around 4 AM when she had recurrent episode of left arm shaking.  EMS was subsequently contacted.  The patient was given Versed 2.5 mg IM and subsequently another 2.5 mg IV with termination of her left arm shaking.  During the entire episode, the patient was awake and alert and conversant and  following commands. In the emergency department, the patient was afebrile and hemodynamically stable with oxygen saturation 100% on room air.  BMP showed sodium 138, potassium 3.6, serum creatinine 0.59.  LFTs or AST 117, ALT 91, alk phosphatase 327, total bilirubin 0.6.  WBC 4.3, hemoglobin 10.8, platelets 71,000 The patient was noted to have significant left upper extremity and left lower extremity weakness.  She was having difficulty ambulating.  As result, admission was recommended for further treatment.  Dr. Mickeal Skinner was contacted by EDP, and he recommended increasing Keppra to 2000 mg twice daily and continuing Decadron at his current dose.  He felt that there was no need for repeat MRI of the brain as the patient has had several MRIs of the brain recently.  Discharge Diagnoses:  Seizure disorder with Todd's paralysis -Increase Keppra dose to 2000 mg twice daily -continue current decadron dose 4 mg daily -no further seizure during hospitalization -PT eval>>SNF -patient did not want to go to SNF>>send home with PT/OT, RN, SW -12/20--left arm and left leg weakness improving, but not back to baseline -12/20--discussed with Dr. Hadassah Pais to go home with short term outpt follow up  -d/c home with dexamethasone 4 mg once daily and keppra 2g bid   Metastatic breast cancer to the brain and liver -follow Dr. Lindi Adie Dr. Mickeal Skinner -therapy switched to [place] -09/24/2021 MR brain--progression of vasogenic edema right frontal parietal lobe; several associated lesions present in the right frontal parietal lobe.  Diffuse mets to calvarium and cervical spine.   Hypothyroidism -Continue Synthroid   Hypertension -Continue metoprolol succinate -Holding valsartan -- will not  restart as BP well controlled on metoprolol   Hyperlipidemia -Holding statin for elevated LFTs temporarily   Generalized weakness -PT evaluation>>SNF -UA--no pyuria  Insomnia -trazodone prn sleep   Discharge  Instructions   Allergies as of 10/05/2021       Reactions   Cantaloupe Extract Allergy Skin Test Shortness Of Breath   Contrast Media [iodinated Diagnostic Agents] Shortness Of Breath, Rash   Pravastatin Other (See Comments)   Legs hurt   Zosyn [piperacillin Sod-tazobactam So] Rash, Other (See Comments)   Temperature increase, facial flushing   Latex Rash   Redness, itch        Medication List     STOP taking these medications    lidocaine-prilocaine cream Commonly known as: EMLA   ondansetron 4 MG disintegrating tablet Commonly known as: ZOFRAN-ODT   potassium chloride SA 20 MEQ tablet Commonly known as: KLOR-CON M   rosuvastatin 5 MG tablet Commonly known as: CRESTOR   triamcinolone 0.025 % ointment Commonly known as: KENALOG   valsartan 80 MG tablet Commonly known as: DIOVAN       TAKE these medications    acetaminophen 500 MG tablet Commonly known as: TYLENOL Take 1,000 mg by mouth every 6 (six) hours as needed for moderate pain.   cholecalciferol 1000 units tablet Commonly known as: VITAMIN D Take 1 tablet (1,000 Units total) by mouth daily.   dexamethasone 4 MG tablet Commonly known as: DECADRON Take 1 tablet (4 mg total) by mouth daily. Start the day after chemotherapy for 2 days. What changed: Another medication with the same name was removed. Continue taking this medication, and follow the directions you see here.   furosemide 20 MG tablet Commonly known as: LASIX TAKE ONE (1) TABLET BY MOUTH EVERY DAY   levETIRAcetam 1000 MG tablet Commonly known as: KEPPRA Take 2 tablets (2,000 mg total) by mouth 2 (two) times daily. What changed:  medication strength how much to take   levothyroxine 100 MCG tablet Commonly known as: SYNTHROID TAKE ONE TABLET EACH MORNING BEFORE BREAKFAST   LORazepam 1 MG tablet Commonly known as: Ativan Take 1 tablet (1 mg total) by mouth 3 (three) times daily as needed for up to 8 doses for seizure (Take 1  tablet by mouth if you have recurrent episodes of left hand shaking.).   metoprolol succinate 100 MG 24 hr tablet Commonly known as: TOPROL-XL Take with or immediately following a meal. What changed:  how much to take how to take this when to take this   multivitamin with minerals tablet Take 1 tablet by mouth daily.   ondansetron 8 MG tablet Commonly known as: Zofran Take 1 tablet (8 mg total) by mouth 2 (two) times daily as needed for refractory nausea / vomiting. Start on day 3 after chemo.   pantoprazole 40 MG tablet Commonly known as: Protonix Take 1 tablet (40 mg total) by mouth daily.   prochlorperazine 10 MG tablet Commonly known as: COMPAZINE Take 1 tablet (10 mg total) by mouth every 6 (six) hours as needed (Nausea or vomiting).   traZODone 50 MG tablet Commonly known as: DESYREL Take 1 tablet (50 mg total) by mouth at bedtime.   VITAMIN C PO Take by mouth.        Follow-up Information     [facility] EMERGENCY DEPARTMENT.   Specialty: Emergency Medicine Why: As needed, If symptoms worsen Contact information: [address] [id number] mc [place] [phone]        Your  neurologist. Call today.                 Allergies  Allergen Reactions   Cantaloupe Extract Allergy Skin Test Shortness Of Breath   Contrast Media [Iodinated Diagnostic Agents] Shortness Of Breath and Rash   Pravastatin Other (See Comments)    Legs hurt   Zosyn [Piperacillin Sod-Tazobactam So] Rash and Other (See Comments)    Temperature increase, facial flushing   Latex Rash    Redness, itch     Consultations: Dr. Mickeal Skinner   Procedures/Studies: DG Chest 1 View  Result Date: 09/24/2021 CLINICAL DATA:  Recent seizure-like activity, initial encounter EXAM: CHEST  1 VIEW COMPARISON:  09/15/2021 FINDINGS: Left chest wall port is again noted and stable. Cardiac shadow is within normal limits. The lungs are well aerated bilaterally. No focal  infiltrate is seen. Sclerotic changes are noted in the bony structures consistent with metastatic disease. IMPRESSION: Diffuse sclerotic metastatic disease. No intrathoracic abnormality is noted. Electronically Signed   By: Inez Catalina M.D.   On: 09/24/2021 00:35   CT Head Wo Contrast  Result Date: 10/04/2021 CLINICAL DATA:  Mental status changes.  Seizure. EXAM: CT HEAD WITHOUT CONTRAST TECHNIQUE: Contiguous axial images were obtained from the base of the skull through the vertex without intravenous contrast. COMPARISON:  MRI 09/24/2021.  CT 09/24/2021 FINDINGS: Brain: Vasogenic edema again noted in the right frontal parietal region, similar to prior and consistent with the known metastatic disease. No evidence for hydrocephalus or midline shift. No abnormal extra-axial fluid collection. Vascular: No hyperdense vessel or unexpected calcification. Skull: Numerous sclerotic bone lesions identified in the skull in C1 vertebral body including a dominant confluent lesion right frontal parietal region up into the vertex. Sinuses/Orbits: The visualized paranasal sinuses and mastoid air cells are clear. Visualized portions of the globes and intraorbital fat are unremarkable. Other: None. IMPRESSION: 1. No substantial change in appearance of vasogenic edema in the right frontal parietal region consistent with known metastatic disease. No evidence for acute intracranial hemorrhage or midline shift. 2. Sclerotic bone lesions in the skull base compatible with known metastatic disease. Electronically Signed   By: Misty Stanley M.D.   On: 10/04/2021 07:21   CT HEAD WO CONTRAST (5MM)  Result Date: 09/24/2021 CLINICAL DATA:  History of breast carcinoma with known metastatic disease. EXAM: CT HEAD WITHOUT CONTRAST TECHNIQUE: Contiguous axial images were obtained from the base of the skull through the vertex without intravenous contrast. COMPARISON:  08/28/2021 FINDINGS: Brain: Persistent vasogenic edema is noted within  the right frontal and parietal lobes similar to that seen on the prior exam. This is again consistent with the known history of metastatic disease. No findings to suggest acute hemorrhage or acute infarction are noted. Vascular: No hyperdense vessel or unexpected calcification. Skull: Diffuse bony metastatic disease is noted consistent with the given clinical history. Sinuses/Orbits: No acute finding. Other: None. IMPRESSION: Stable vasogenic edema throughout the right frontal and parietal lobe consistent with the known history of metastatic disease. These changes were better visualized on recent MRI examination. No new focal abnormality is noted. Findings consistent with diffuse bony metastatic disease. Electronically Signed   By: Inez Catalina M.D.   On: 09/24/2021 00:41   MR BRAIN WO CONTRAST  Result Date: 09/24/2021 CLINICAL DATA:  Acute neuro deficit. Suspect stroke. Seizure. History of metastatic breast cancer. EXAM: MRI HEAD WITHOUT CONTRAST TECHNIQUE: Multiplanar, multiecho pulse sequences of the brain and surrounding structures were obtained without intravenous contrast. COMPARISON:  CT head 09/24/2021.  MRI head 09/03/2021 FINDINGS: Brain: Progressive vasogenic edema in the right frontal parietal lobe over the convexity. Associated mass lesions in the right frontal parietal lobe again noted. These showed enhancement on the prior study. Contrast not administered today. These lesions show susceptibility, similar to the prior study. There is also dural thickening on the right due to tumor invasion likely from the calvarium. Small lesion in the right anterior temporal lobe best seen on axial FLAIR image 14. This enhanced on the prior study and is unchanged. Ventricle size normal.  No midline shift.  No acute infarct. Vascular: Normal arterial flow voids Skull and upper cervical spine: Extensive metastatic disease to the calvarium primarily on the right side. Diffuse metastatic disease in the cervical spine.  Sinuses/Orbits: Mucosal edema paranasal sinuses.  Negative orbit Other: None IMPRESSION: 1. Progression of vasogenic edema in the right frontal parietal lobe. Several associated lesions are present in the right frontal parietal lobe which enhanced on the prior study. These show susceptibility. There is diffuse metastatic disease to the right side of the skull with dural invasion 2. Small lesion right anterior temporal lobe, stable 3. 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 Electronically Signed   By: Franchot Gallo M.D.   On: 09/24/2021 08:24   CT CHEST ABDOMEN PELVIS W CONTRAST  Result Date: 09/15/2021 CLINICAL DATA:  Metastatic breast cancer.  Restaging. EXAM: CT CHEST, ABDOMEN, AND PELVIS WITH CONTRAST TECHNIQUE: Multidetector CT imaging of the chest, abdomen and pelvis was performed following the standard protocol during bolus administration of intravenous contrast. CONTRAST:  49m OMNIPAQUE IOHEXOL 350 MG/ML SOLN COMPARISON:  Chest abdomen pelvis CT 04/02/2021 FINDINGS: CT CHEST FINDINGS Cardiovascular: The heart size is normal. No substantial pericardial effusion. No thoracic aortic aneurysm. Left Port-A-Cath tip is positioned in the mid right atrium. Mediastinum/Nodes: No mediastinal lymphadenopathy. There is no hilar lymphadenopathy. Tiny hiatal hernia. The esophagus has normal imaging features. There is no axillary lymphadenopathy. Lungs/Pleura: No suspicious pulmonary nodule or mass. Stable subpleural scarring left lung apex. Subpleural reticulation anterior left upper lobe suggest prior radiation treatment. No focal airspace consolidation. There is no evidence of pleural effusion. Musculoskeletal: Sclerotic bone metastases again noted, similar to prior. 15 mm sternal lesion on 50/4 was 15 mm previously (remeasured). CT ABDOMEN PELVIS FINDINGS Hepatobiliary: Liver metastases better demonstrated on today's postcontrast study. 3.4 cm lesion in the lateral right liver on  64/2 was measured previously at 2.1 cm. 2.1 cm right hepatic lesion on 60/2 was measured previously at 1.3 cm. 1.6 cm lesion medial right liver on 67/2 was very subtle on the prior study measuring about 1.1 cm (remeasured). This lesion was not visible on the postcontrast study from 11/26/2020. Small ill-defined hypoenhancing lesion in the left liver measuring 14 mm on 59/2 was not visible on the prior noncontrast study or a postcontrast exam from 11/26/2020. Tiny calcified gallstone evident. No intrahepatic or extrahepatic biliary dilation. Pancreas: No focal mass lesion. No dilatation of the main duct. No intraparenchymal cyst. No peripancreatic edema. Spleen: No splenomegaly. No focal mass lesion. Adrenals/Urinary Tract: No adrenal nodule or mass. Cortical scarring noted both kidneys with several left renal cysts again noted. Right ureter unremarkable. Mild fullness of the left ureter is nonspecific. Bladder is moderately distended. Stomach/Bowel: Stomach is decompressed. Duodenum is normally positioned as is the ligament of Treitz. No small bowel wall thickening. No small bowel dilatation. The terminal ileum is normal. The appendix is not well visualized, but there is no edema or inflammation in the  region of the cecum. No gross colonic mass. No colonic wall thickening. Large stool volume evident. Vascular/Lymphatic: No abdominal aortic aneurysm. No abdominal lymphadenopathy No pelvic sidewall lymphadenopathy. Reproductive:  There is no adnexal mass. Other: No intraperitoneal free fluid. Musculoskeletal: Stable widespread sclerotic bone metastases again noted. 2.9 cm sclerotic S1 lesion was 3.0 cm previously (remeasured). IMPRESSION: 1. Interval progression of hepatic metastases better demonstrated on today's postcontrast study than on the most recent comparison study. Several lesions on today's study were not visible on the previous noncontrast exam nor on the postcontrast study from 11/26/2020 suggesting new  disease. 2. Similar appearance of widespread sclerotic bone metastases. 3. Cholelithiasis. 4. Large stool volume. Imaging features could be compatible with constipation in the appropriate clinical setting. 5. Mild fullness of the left ureter is nonspecific given the bladder distension. Attention on follow-up recommended. 6. Aortic Atherosclerosis (ICD10-I70.0). Electronically Signed   By: Misty Stanley M.D.   On: 09/15/2021 94:49   NM PET Metabolic Brain  Result Date: 09/15/2021 CLINICAL DATA:  Dementia. Frontotemporal dementia versus Alzheimer's type dementia EXAM: NM PET METABOLIC BRAIN TECHNIQUE: 9.7 mCi F-18 FDG was injected intravenously. Full-ring PET imaging was performed from the vertex to skull base. CT data was obtained and used for attenuation correction and anatomic localization. PET data set fused with postcontrast T1 weighted MRI data set from 09/03/2021. FASTING BLOOD GLUCOSE:  Value: 112 mg/dl COMPARISON:  Brain MRI 09/03/2021, 06/02/2021 FINDINGS: Two regions of post-contrast T1 weighted MRI enhancement in the high RIGHT frontal lobe have metabolic activity similar to adjacent cortical gyri. No focal activity greater than background gyral activity to clearly suggest brain tumor recurrence. Along the medial margin of the anterior lesion, there is clear hypometabolic activity suggesting tumor necrosis. IMPRESSION: 1. No convincing evidence brain tumor recurrence with metabolic activity of the enhancing high RIGHT frontal lesions similar to adjacent cortical gyri. 2. The more anterior expanding RIGHT frontal lesion does have hypometabolism along the medial border suggesting tumor necrosis. Electronically Signed   By: Suzy Bouchard M.D.   On: 09/15/2021 09:20   DG Chest Port 1 View  Result Date: 10/04/2021 CLINICAL DATA:  Lethargy and altered mental status. EXAM: PORTABLE CHEST 1 VIEW COMPARISON:  Portable chest 09/23/2021. FINDINGS: Left IJ port catheter insertion with tip in the distal  SVC is unchanged. The cardiac size is normal. There is calcification of the transverse aorta. Pulmonary vasculature is normal. The lungs are clear of infiltrate. Widespread skeletal metastatic osteoblastic disease is redemonstrated. IMPRESSION: No evidence of acute chest disease. Widespread osseous sclerotic metastatic disease. Electronically Signed   By: Telford Nab M.D.   On: 10/04/2021 06:22   ECHOCARDIOGRAM COMPLETE  Result Date: 09/27/2021    ECHOCARDIOGRAM REPORT   Patient Name:   [name] Riley Date of Exam: 09/27/2021 Medical Rec #:  [id number]      Height:       68.0 in Accession #:    [id number]     Weight:       132.3 lb Date of Birth:  [birth year]      BSA:          1.714 m Patient Age:    60 years       BP:           138/74 mmHg Patient Gender: F              HR:           69 bpm. Exam Location:  Outpatient Procedure: 2D Echo,  3D Echo, Cardiac Doppler, Color Doppler and Strain Analysis Indications:    Chemo Z09  History:        Patient has prior history of Echocardiogram examinations. CAD                 and Previous Myocardial Infarction; Risk Factors:Hypertension                 and Dyslipidemia. Breast cancer. Seizure with left sided                 weakness.  Sonographer:    Darlina Sicilian RDCS Referring Phys: [id number] Nicholas Lose  Sonographer Comments: Echo perfored with the patient supine. IMPRESSIONS  1. Left ventricular ejection fraction, by estimation, is 60 to 65%. The left ventricle has normal function. The left ventricle has no regional wall motion abnormalities. Left ventricular diastolic parameters are consistent with Grade I diastolic dysfunction (impaired relaxation). The average left ventricular global longitudinal strain is -16.7 %.  2. Right ventricular systolic function is normal. The right ventricular size is normal. There is normal pulmonary artery systolic pressure.  3. Left atrial size was moderately dilated.  4. The mitral valve is normal in structure. No evidence of  mitral valve regurgitation. No evidence of mitral stenosis.  5. The aortic valve is tricuspid. There is mild calcification of the aortic valve. Aortic valve regurgitation is mild. No aortic stenosis is present.  6. The inferior vena cava is normal in size with greater than 50% respiratory variability, suggesting right atrial pressure of 3 mmHg. FINDINGS  Left Ventricle: Left ventricular ejection fraction, by estimation, is 60 to 65%. The left ventricle has normal function. The left ventricle has no regional wall motion abnormalities. The average left ventricular global longitudinal strain is -16.7 %. The global longitudinal strain is normal despite suboptimal segment tracking. The left ventricular internal cavity size was normal in size. There is no left ventricular hypertrophy. Left ventricular diastolic parameters are consistent with Grade I diastolic dysfunction (impaired relaxation). Right Ventricle: The right ventricular size is normal. No increase in right ventricular wall thickness. Right ventricular systolic function is normal. There is normal pulmonary artery systolic pressure. The tricuspid regurgitant velocity is 1.86 m/s, and  with an assumed right atrial pressure of 3 mmHg, the estimated right ventricular systolic pressure is 54.5 mmHg. Left Atrium: Left atrial size was moderately dilated. Right Atrium: Right atrial size was normal in size. Pericardium: There is no evidence of pericardial effusion. Mitral Valve: The mitral valve is normal in structure. Mild mitral annular calcification. No evidence of mitral valve regurgitation. No evidence of mitral valve stenosis. Tricuspid Valve: The tricuspid valve is normal in structure. Tricuspid valve regurgitation is trivial. No evidence of tricuspid stenosis. Aortic Valve: The aortic valve is tricuspid. There is mild calcification of the aortic valve. Aortic valve regurgitation is mild. Aortic regurgitation PHT measures 540 msec. No aortic stenosis is present.  Pulmonic Valve: The pulmonic valve was normal in structure. Pulmonic valve regurgitation is trivial. No evidence of pulmonic stenosis. Aorta: The aortic root is normal in size and structure. Venous: The inferior vena cava is normal in size with greater than 50% respiratory variability, suggesting right atrial pressure of 3 mmHg. IAS/Shunts: No atrial level shunt detected by color flow Doppler.  LEFT VENTRICLE PLAX 2D LVIDd:         4.00 cm   Diastology LVIDs:         2.70 cm   LV e' medial:    5.55  cm/s LV PW:         1.00 cm   LV E/e' medial:  11.4 LV IVS:        1.20 cm   LV e' lateral:   6.42 cm/s LVOT diam:     2.20 cm   LV E/e' lateral: 9.9 LV SV:         78 LV SV Index:   45        2D Longitudinal Strain LVOT Area:     3.80 cm  2D Strain GLS Avg:     -16.7 %  RIGHT VENTRICLE RV S prime:     10.30 cm/s TAPSE (M-mode): 1.2 cm LEFT ATRIUM             Index        RIGHT ATRIUM          Index LA diam:        3.20 cm 1.87 cm/m   RA Area:     6.79 cm LA Vol (A2C):   49.6 ml 28.93 ml/m  RA Volume:   10.20 ml 5.95 ml/m LA Vol (A4C):   50.2 ml 29.28 ml/m LA Biplane Vol: 54.0 ml 31.50 ml/m  AORTIC VALVE LVOT Vmax:   83.00 cm/s LVOT Vmean:  59.700 cm/s LVOT VTI:    0.205 m AI PHT:      540 msec  AORTA Ao Root diam: 3.50 cm Ao Asc diam:  2.90 cm MITRAL VALVE               TRICUSPID VALVE MV Area (PHT): 3.68 cm    TR Peak grad:   13.8 mmHg MV Decel Time: 206 msec    TR Vmax:        186.00 cm/s MV E velocity: 63.40 cm/s MV A velocity: 83.10 cm/s  SHUNTS MV E/A ratio:  0.76        Systemic VTI:  0.20 m                            Systemic Diam: 2.20 cm Glori Bickers MD Electronically signed by Glori Bickers MD Signature Date/Time: 09/27/2021/9:46:49 AM    Final         Discharge Exam: Vitals:   10/05/21 0830 10/05/21 1032  BP: 110/64 115/67  Pulse: 74 70  Resp: 20   Temp: 98.4 F (36.9 C)   SpO2: 97%    Vitals:   10/05/21 0028 10/05/21 0431 10/05/21 0830 10/05/21 1032  BP: 111/66 113/60 110/64  115/67  Pulse: 71 73 74 70  Resp: _0 Temp: 98.6 F (37 C) 98.2 F (36.8 C) 98.4 F (36.9 C)   TempSrc: Oral Oral Oral   SpO2: 96% 97% 97%   Weight:      Height:        General: Pt is alert, awake, not in acute distress Cardiovascular: RRR, S1/S2 +, no rubs, no gallops Respiratory: CTA bilaterally, no wheezing, no rhonchi Abdominal: Soft, NT, ND, bowel sounds + Extremities: no edema, no cyanosis   The results of significant diagnostics from this hospitalization (including imaging, microbiology, ancillary and laboratory) are listed below for reference.    Significant Diagnostic Studies: DG Chest 1 View  Result Date: 09/24/2021 CLINICAL DATA:  Recent seizure-like activity, initial encounter EXAM: CHEST  1 VIEW COMPARISON:  09/15/2021 FINDINGS: Left chest wall port is again noted and stable. Cardiac shadow is within normal limits. The lungs are well  aerated bilaterally. No focal infiltrate is seen. Sclerotic changes are noted in the bony structures consistent with metastatic disease. IMPRESSION: Diffuse sclerotic metastatic disease. No intrathoracic abnormality is noted. Electronically Signed   By: Inez Catalina M.D.   On: 09/24/2021 00:35   CT Head Wo Contrast  Result Date: 10/04/2021 CLINICAL DATA:  Mental status changes.  Seizure. EXAM: CT HEAD WITHOUT CONTRAST TECHNIQUE: Contiguous axial images were obtained from the base of the skull through the vertex without intravenous contrast. COMPARISON:  MRI 09/24/2021.  CT 09/24/2021 FINDINGS: Brain: Vasogenic edema again noted in the right frontal parietal region, similar to prior and consistent with the known metastatic disease. No evidence for hydrocephalus or midline shift. No abnormal extra-axial fluid collection. Vascular: No hyperdense vessel or unexpected calcification. Skull: Numerous sclerotic bone lesions identified in the skull in C1 vertebral body including a dominant confluent lesion right frontal parietal region up into  the vertex. Sinuses/Orbits: The visualized paranasal sinuses and mastoid air cells are clear. Visualized portions of the globes and intraorbital fat are unremarkable. Other: None. IMPRESSION: 1. No substantial change in appearance of vasogenic edema in the right frontal parietal region consistent with known metastatic disease. No evidence for acute intracranial hemorrhage or midline shift. 2. Sclerotic bone lesions in the skull base compatible with known metastatic disease. Electronically Signed   By: Misty Stanley M.D.   On: 10/04/2021 07:21   CT HEAD WO CONTRAST (5MM)  Result Date: 09/24/2021 CLINICAL DATA:  History of breast carcinoma with known metastatic disease. EXAM: CT HEAD WITHOUT CONTRAST TECHNIQUE: Contiguous axial images were obtained from the base of the skull through the vertex without intravenous contrast. COMPARISON:  08/28/2021 FINDINGS: Brain: Persistent vasogenic edema is noted within the right frontal and parietal lobes similar to that seen on the prior exam. This is again consistent with the known history of metastatic disease. No findings to suggest acute hemorrhage or acute infarction are noted. Vascular: No hyperdense vessel or unexpected calcification. Skull: Diffuse bony metastatic disease is noted consistent with the given clinical history. Sinuses/Orbits: No acute finding. Other: None. IMPRESSION: Stable vasogenic edema throughout the right frontal and parietal lobe consistent with the known history of metastatic disease. These changes were better visualized on recent MRI examination. No new focal abnormality is noted. Findings consistent with diffuse bony metastatic disease. Electronically Signed   By: Inez Catalina M.D.   On: 09/24/2021 00:41   MR BRAIN WO CONTRAST  Result Date: 09/24/2021 CLINICAL DATA:  Acute neuro deficit. Suspect stroke. Seizure. History of metastatic breast cancer. EXAM: MRI HEAD WITHOUT CONTRAST TECHNIQUE: Multiplanar, multiecho pulse sequences of the brain  and surrounding structures were obtained without intravenous contrast. COMPARISON:  CT head 09/24/2021.  MRI head 09/03/2021 FINDINGS: Brain: Progressive vasogenic edema in the right frontal parietal lobe over the convexity. Associated mass lesions in the right frontal parietal lobe again noted. These showed enhancement on the prior study. Contrast not administered today. These lesions show susceptibility, similar to the prior study. There is also dural thickening on the right due to tumor invasion likely from the calvarium. Small lesion in the right anterior temporal lobe best seen on axial FLAIR image 14. This enhanced on the prior study and is unchanged. Ventricle size normal.  No midline shift.  No acute infarct. Vascular: Normal arterial flow voids Skull and upper cervical spine: Extensive metastatic disease to the calvarium primarily on the right side. Diffuse metastatic disease in the cervical spine. Sinuses/Orbits: Mucosal edema paranasal sinuses.  Negative orbit Other:  None IMPRESSION: 1. Progression of vasogenic edema in the right frontal parietal lobe. Several associated lesions are present in the right frontal parietal lobe which enhanced on the prior study. These show susceptibility. There is diffuse metastatic disease to the right side of the skull with dural invasion 2. Small lesion right anterior temporal lobe, stable 3. 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 Electronically Signed   By: Franchot Gallo M.D.   On: 09/24/2021 08:24   CT CHEST ABDOMEN PELVIS W CONTRAST  Result Date: 09/15/2021 CLINICAL DATA:  Metastatic breast cancer.  Restaging. EXAM: CT CHEST, ABDOMEN, AND PELVIS WITH CONTRAST TECHNIQUE: Multidetector CT imaging of the chest, abdomen and pelvis was performed following the standard protocol during bolus administration of intravenous contrast. CONTRAST:  50m OMNIPAQUE IOHEXOL 350 MG/ML SOLN COMPARISON:  Chest abdomen pelvis CT 04/02/2021 FINDINGS:  CT CHEST FINDINGS Cardiovascular: The heart size is normal. No substantial pericardial effusion. No thoracic aortic aneurysm. Left Port-A-Cath tip is positioned in the mid right atrium. Mediastinum/Nodes: No mediastinal lymphadenopathy. There is no hilar lymphadenopathy. Tiny hiatal hernia. The esophagus has normal imaging features. There is no axillary lymphadenopathy. Lungs/Pleura: No suspicious pulmonary nodule or mass. Stable subpleural scarring left lung apex. Subpleural reticulation anterior left upper lobe suggest prior radiation treatment. No focal airspace consolidation. There is no evidence of pleural effusion. Musculoskeletal: Sclerotic bone metastases again noted, similar to prior. 15 mm sternal lesion on 50/4 was 15 mm previously (remeasured). CT ABDOMEN PELVIS FINDINGS Hepatobiliary: Liver metastases better demonstrated on today's postcontrast study. 3.4 cm lesion in the lateral right liver on 64/2 was measured previously at 2.1 cm. 2.1 cm right hepatic lesion on 60/2 was measured previously at 1.3 cm. 1.6 cm lesion medial right liver on 67/2 was very subtle on the prior study measuring about 1.1 cm (remeasured). This lesion was not visible on the postcontrast study from 11/26/2020. Small ill-defined hypoenhancing lesion in the left liver measuring 14 mm on 59/2 was not visible on the prior noncontrast study or a postcontrast exam from 11/26/2020. Tiny calcified gallstone evident. No intrahepatic or extrahepatic biliary dilation. Pancreas: No focal mass lesion. No dilatation of the main duct. No intraparenchymal cyst. No peripancreatic edema. Spleen: No splenomegaly. No focal mass lesion. Adrenals/Urinary Tract: No adrenal nodule or mass. Cortical scarring noted both kidneys with several left renal cysts again noted. Right ureter unremarkable. Mild fullness of the left ureter is nonspecific. Bladder is moderately distended. Stomach/Bowel: Stomach is decompressed. Duodenum is normally positioned as is  the ligament of Treitz. No small bowel wall thickening. No small bowel dilatation. The terminal ileum is normal. The appendix is not well visualized, but there is no edema or inflammation in the region of the cecum. No gross colonic mass. No colonic wall thickening. Large stool volume evident. Vascular/Lymphatic: No abdominal aortic aneurysm. No abdominal lymphadenopathy No pelvic sidewall lymphadenopathy. Reproductive:  There is no adnexal mass. Other: No intraperitoneal free fluid. Musculoskeletal: Stable widespread sclerotic bone metastases again noted. 2.9 cm sclerotic S1 lesion was 3.0 cm previously (remeasured). IMPRESSION: 1. Interval progression of hepatic metastases better demonstrated on today's postcontrast study than on the most recent comparison study. Several lesions on today's study were not visible on the previous noncontrast exam nor on the postcontrast study from 11/26/2020 suggesting new disease. 2. Similar appearance of widespread sclerotic bone metastases. 3. Cholelithiasis. 4. Large stool volume. Imaging features could be compatible with constipation in the appropriate clinical setting. 5. Mild fullness of the left ureter is nonspecific  given the bladder distension. Attention on follow-up recommended. 6. Aortic Atherosclerosis (ICD10-I70.0). Electronically Signed   By: Misty Stanley M.D.   On: 09/15/2021 01:09   NM PET Metabolic Brain  Result Date: 09/15/2021 CLINICAL DATA:  Dementia. Frontotemporal dementia versus Alzheimer's type dementia EXAM: NM PET METABOLIC BRAIN TECHNIQUE: 9.7 mCi F-18 FDG was injected intravenously. Full-ring PET imaging was performed from the vertex to skull base. CT data was obtained and used for attenuation correction and anatomic localization. PET data set fused with postcontrast T1 weighted MRI data set from 09/03/2021. FASTING BLOOD GLUCOSE:  Value: 112 mg/dl COMPARISON:  Brain MRI 09/03/2021, 06/02/2021 FINDINGS: Two regions of post-contrast T1 weighted MRI  enhancement in the high RIGHT frontal lobe have metabolic activity similar to adjacent cortical gyri. No focal activity greater than background gyral activity to clearly suggest brain tumor recurrence. Along the medial margin of the anterior lesion, there is clear hypometabolic activity suggesting tumor necrosis. IMPRESSION: 1. No convincing evidence brain tumor recurrence with metabolic activity of the enhancing high RIGHT frontal lesions similar to adjacent cortical gyri. 2. The more anterior expanding RIGHT frontal lesion does have hypometabolism along the medial border suggesting tumor necrosis. Electronically Signed   By: Suzy Bouchard M.D.   On: 09/15/2021 09:20   DG Chest Port 1 View  Result Date: 10/04/2021 CLINICAL DATA:  Lethargy and altered mental status. EXAM: PORTABLE CHEST 1 VIEW COMPARISON:  Portable chest 09/23/2021. FINDINGS: Left IJ port catheter insertion with tip in the distal SVC is unchanged. The cardiac size is normal. There is calcification of the transverse aorta. Pulmonary vasculature is normal. The lungs are clear of infiltrate. Widespread skeletal metastatic osteoblastic disease is redemonstrated. IMPRESSION: No evidence of acute chest disease. Widespread osseous sclerotic metastatic disease. Electronically Signed   By: Telford Nab M.D.   On: 10/04/2021 06:22   ECHOCARDIOGRAM COMPLETE  Result Date: 09/27/2021    ECHOCARDIOGRAM REPORT   Patient Name:   [name] Riley Date of Exam: 09/27/2021 Medical Rec #:  [id number]      Height:       68.0 in Accession #:    [id number]     Weight:       132.3 lb Date of Birth:  10-12-1951      BSA:          1.714 m Patient Age:    23 years       BP:           138/74 mmHg Patient Gender: F              HR:           69 bpm. Exam Location:  Outpatient Procedure: 2D Echo, 3D Echo, Cardiac Doppler, Color Doppler and Strain Analysis Indications:    Chemo Z09  History:        Patient has prior history of Echocardiogram examinations. CAD                  and Previous Myocardial Infarction; Risk Factors:Hypertension                 and Dyslipidemia. Breast cancer. Seizure with left sided                 weakness.  Sonographer:    Darlina Sicilian RDCS Referring Phys: [id number] Nicholas Lose  Sonographer Comments: Echo perfored with the patient supine. IMPRESSIONS  1. Left ventricular ejection fraction, by estimation, is 60 to 65%. The left ventricle has normal function.  The left ventricle has no regional wall motion abnormalities. Left ventricular diastolic parameters are consistent with Grade I diastolic dysfunction (impaired relaxation). The average left ventricular global longitudinal strain is -16.7 %.  2. Right ventricular systolic function is normal. The right ventricular size is normal. There is normal pulmonary artery systolic pressure.  3. Left atrial size was moderately dilated.  4. The mitral valve is normal in structure. No evidence of mitral valve regurgitation. No evidence of mitral stenosis.  5. The aortic valve is tricuspid. There is mild calcification of the aortic valve. Aortic valve regurgitation is mild. No aortic stenosis is present.  6. The inferior vena cava is normal in size with greater than 50% respiratory variability, suggesting right atrial pressure of 3 mmHg. FINDINGS  Left Ventricle: Left ventricular ejection fraction, by estimation, is 60 to 65%. The left ventricle has normal function. The left ventricle has no regional wall motion abnormalities. The average left ventricular global longitudinal strain is -16.7 %. The global longitudinal strain is normal despite suboptimal segment tracking. The left ventricular internal cavity size was normal in size. There is no left ventricular hypertrophy. Left ventricular diastolic parameters are consistent with Grade I diastolic dysfunction (impaired relaxation). Right Ventricle: The right ventricular size is normal. No increase in right ventricular wall thickness. Right ventricular systolic  function is normal. There is normal pulmonary artery systolic pressure. The tricuspid regurgitant velocity is 1.86 m/s, and  with an assumed right atrial pressure of 3 mmHg, the estimated right ventricular systolic pressure is 55.7 mmHg. Left Atrium: Left atrial size was moderately dilated. Right Atrium: Right atrial size was normal in size. Pericardium: There is no evidence of pericardial effusion. Mitral Valve: The mitral valve is normal in structure. Mild mitral annular calcification. No evidence of mitral valve regurgitation. No evidence of mitral valve stenosis. Tricuspid Valve: The tricuspid valve is normal in structure. Tricuspid valve regurgitation is trivial. No evidence of tricuspid stenosis. Aortic Valve: The aortic valve is tricuspid. There is mild calcification of the aortic valve. Aortic valve regurgitation is mild. Aortic regurgitation PHT measures 540 msec. No aortic stenosis is present. Pulmonic Valve: The pulmonic valve was normal in structure. Pulmonic valve regurgitation is trivial. No evidence of pulmonic stenosis. Aorta: The aortic root is normal in size and structure. Venous: The inferior vena cava is normal in size with greater than 50% respiratory variability, suggesting right atrial pressure of 3 mmHg. IAS/Shunts: No atrial level shunt detected by color flow Doppler.  LEFT VENTRICLE PLAX 2D LVIDd:         4.00 cm   Diastology LVIDs:         2.70 cm   LV e' medial:    5.55 cm/s LV PW:         1.00 cm   LV E/e' medial:  11.4 LV IVS:        1.20 cm   LV e' lateral:   6.42 cm/s LVOT diam:     2.20 cm   LV E/e' lateral: 9.9 LV SV:         78 LV SV Index:   45        2D Longitudinal Strain LVOT Area:     3.80 cm  2D Strain GLS Avg:     -16.7 %  RIGHT VENTRICLE RV S prime:     10.30 cm/s TAPSE (M-mode): 1.2 cm LEFT ATRIUM             Index  RIGHT ATRIUM          Index LA diam:        3.20 cm 1.87 cm/m   RA Area:     6.79 cm LA Vol (A2C):   49.6 ml 28.93 ml/m  RA Volume:   10.20 ml 5.95  ml/m LA Vol (A4C):   50.2 ml 29.28 ml/m LA Biplane Vol: 54.0 ml 31.50 ml/m  AORTIC VALVE LVOT Vmax:   83.00 cm/s LVOT Vmean:  59.700 cm/s LVOT VTI:    0.205 m AI PHT:      540 msec  AORTA Ao Root diam: 3.50 cm Ao Asc diam:  2.90 cm MITRAL VALVE               TRICUSPID VALVE MV Area (PHT): 3.68 cm    TR Peak grad:   13.8 mmHg MV Decel Time: 206 msec    TR Vmax:        186.00 cm/s MV E velocity: 63.40 cm/s MV A velocity: 83.10 cm/s  SHUNTS MV E/A ratio:  0.76        Systemic VTI:  0.20 m                            Systemic Diam: 2.20 cm Glori Bickers MD Electronically signed by Glori Bickers MD Signature Date/Time: 09/27/2021/9:46:49 AM    Final     Microbiology: Recent Results (from the past 240 hour(s))  Resp Panel by RT-PCR (Flu A&B, Covid) Nasopharyngeal Swab     Status: None   Collection Time: 10/04/21  6:03 AM   Specimen: Nasopharyngeal Swab; Nasopharyngeal(NP) swabs in vial transport medium  Result Value Ref Range Status   SARS Coronavirus 2 by RT PCR NEGATIVE NEGATIVE Final    Comment: (NOTE) SARS-CoV-2 target nucleic acids are NOT DETECTED.  The SARS-CoV-2 RNA is generally detectable in upper respiratory specimens during the acute phase of infection. The lowest concentration of SARS-CoV-2 viral copies this assay can detect is 138 copies/mL. A negative result does not preclude SARS-Cov-2 infection and should not be used as the sole basis for treatment or other patient management decisions. A negative result may occur with  improper specimen collection/handling, submission of specimen other than nasopharyngeal swab, presence of viral mutation(s) within the areas targeted by this assay, and inadequate number of viral copies(<138 copies/mL). A negative result must be combined with clinical observations, patient history, and epidemiological information. The expected result is Negative.  Fact Sheet for Patients:  EntrepreneurPulse.com.au  Fact Sheet for  Healthcare Providers:  IncredibleEmployment.be  This test is no t yet approved or cleared by the [place] FDA and  has been authorized for detection and/or diagnosis of SARS-CoV-2 by FDA under an Emergency Use Authorization (EUA). This EUA will remain  in effect (meaning this test can be used) for the duration of the COVID-19 declaration under Section 564(b)(1) of the Act, 21 U.S.C.section 360bbb-3(b)(1), unless the authorization is terminated  or revoked sooner.       Influenza A by PCR NEGATIVE NEGATIVE Final   Influenza B by PCR NEGATIVE NEGATIVE Final    Comment: (NOTE) The Xpert Xpress SARS-CoV-2/FLU/RSV plus assay is intended as an aid in the diagnosis of influenza from Nasopharyngeal swab specimens and should not be used as a sole basis for treatment. Nasal washings and aspirates are unacceptable for Xpert Xpress SARS-CoV-2/FLU/RSV testing.  Fact Sheet for Patients: EntrepreneurPulse.com.au  Fact Sheet for Healthcare Providers: IncredibleEmployment.be  This test  is not yet approved or cleared by the [place] and has been authorized for detection and/or diagnosis of SARS-CoV-2 by FDA under an Emergency Use Authorization (EUA). This EUA will remain in effect (meaning this test can be used) for the duration of the COVID-19 declaration under Section 564(b)(1) of the Act, 21 U.S.C. section 360bbb-3(b)(1), unless the authorization is terminated or revoked.  Performed at [facility], [address] [place], [place] [address]      Labs: Basic Metabolic Panel: Recent Labs  Lab 10/04/21 0554 10/05/21 0503  NA 138 135  K 3.6 3.6  CL 101 103  CO2 27 27  GLUCOSE 84 81  BUN 25* 21  CREATININE 0.59 0.62  CALCIUM 9.0 8.2*  MG  --  1.7   Liver Function Tests: Recent Labs  Lab 10/04/21 0554 10/05/21 0503  AST 117* 101*  ALT 91* 78*  ALKPHOS 327* 299*  BILITOT 0.6 0.9  PROT 5.9* 5.3*  ALBUMIN  3.0* 2.5*   No results for input(s): LIPASE, AMYLASE in the last 168 hours. No results for input(s): AMMONIA in the last 168 hours. CBC: Recent Labs  Lab 10/04/21 0554 10/05/21 0503  WBC 4.3 3.7*  NEUTROABS 2.2  --   HGB 10.8* 10.4*  HCT 31.7* 31.0*  MCV 110.5* 110.3*  PLT 71* 65*   Cardiac Enzymes: No results for input(s): CKTOTAL, CKMB, CKMBINDEX, TROPONINI in the last 168 hours. BNP: Invalid input(s): POCBNP CBG: No results for input(s): GLUCAP in the last 168 hours.  Time coordinating discharge:  36 minutes  Signed:  Orson Eva, DO [facility] Pager: [phone] 10/05/2021, 11:45 AM

## 2021-10-05 NOTE — Evaluation (Signed)
Physical Therapy Evaluation Patient Details Name: [name] Riley MRN: [id number] DOB: Jan 24, 1951 Today's Date: 10/05/2021  History of Present Illness  [name] Riley is a 70 y.o. female with medical history of seizure disorder, breast cancer with brain metastasis, hypertension, hyperlipidemia, hypothyroidism, coronary artery disease presenting with focal seizures.  The patient states that she has a sensation like "waves in my stomach" prior to the initiation of her seizure.  This was followed by shaking her left upper extremity which is subsequently resulting in whole body shaking.  The patient was recently discharged from the hospital after a stay from 09/23/2021 to 09/25/2021 for similar episode.  She was discharged home at that time with an increase of her Keppra dose up to 1500 mg twice daily and her dexamethasone was increased to 4 mg twice daily.  She states that she has not had any seizures since discharge home.  She followed up with Dr. Mickeal Riley on 09/27/2021 in the office.  At that time, she was continued on her discharge dose of Keppra, but her dexamethasone was decreased to 4 mg once daily.  She denies any new medications.  She denies any fever, chills, headache, neck pain, chest pain, shortness of breath, nausea, vomiting, diarrhea, abdominal pain.   Clinical Impression  Patient limited for functional mobility as stated below secondary to BLE weakness, fatigue and poor standing balance. Patient requires mod/max assist with mobility today secondary to L sided weakness and coordination deficits in addition to generalized weakness. Patient with posterior lateral lean with sitting balance requiring intermittent assist to maintain balance. Patient limited by fatigue and impaired activity tolerance. Patient will benefit from continued physical therapy in hospital and recommended venue below to increase strength, balance, endurance for safe ADLs and gait.        Recommendations for follow up therapy  are one component of a multi-disciplinary discharge planning process, led by the attending physician.  Recommendations may be updated based on patient status, additional functional criteria and insurance authorization.  Follow Up Recommendations Skilled nursing-short term rehab (<3 hours/day)    Assistance Recommended at Discharge Frequent or constant Supervision/Assistance  Functional Status Assessment Patient has had a recent decline in their functional status and demonstrates the ability to make significant improvements in function in a reasonable and predictable amount of time.  Equipment Recommendations  None recommended by PT    Recommendations for Other Services       Precautions / Restrictions Precautions Precautions: Fall Restrictions Weight Bearing Restrictions: No      Mobility  Bed Mobility Overal bed mobility: Needs Assistance Bed Mobility: Supine to Sit;Sit to Supine     Supine to sit: Mod assist Sit to supine: Mod assist;Max assist   General bed mobility comments: slow labored movement with limited use of LUE due to weakness and coordination deficits; requires assist to pull to seated and for LE movement    Transfers Overall transfer level: Needs assistance Equipment used: Rolling walker (2 wheels) Transfers: Sit to/from Stand Sit to Stand: Mod assist;Max assist           General transfer comment: requires assist for weakness, coordination, and balance deficits; cueing for hand placement and sequencing    Ambulation/Gait                  Stairs            Wheelchair Mobility    Modified Rankin (Stroke Patients Only)       Balance Overall balance assessment: Needs assistance  Sitting-balance support: Feet supported;No upper extremity supported Sitting balance-Leahy Scale: Poor Sitting balance - Comments: fair/poor seated at EOB Postural control: Left lateral lean;Posterior lean Standing balance support: Bilateral upper extremity  supported;Reliant on assistive device for balance Standing balance-Leahy Scale: Poor                               Pertinent Vitals/Pain Pain Assessment: No/denies pain    Home Living Family/patient expects to be discharged to:: Private residence Living Arrangements: Spouse/significant other Available Help at Discharge: Family;Available 24 hours/day Type of Home: House Home Access: Stairs to enter Entrance Stairs-Rails: Right;Left;Can reach both Entrance Stairs-Number of Steps: 4 Alternate Level Stairs-Number of Steps: 8 landing + 8 Home Layout: Two level;Able to live on main level with bedroom/bathroom;Full bath on main level Home Equipment: Grab bars - tub/shower;Rolling Walker (2 wheels);Wheelchair - manual      Prior Function Prior Level of Function : Needs assist       Physical Assist : Mobility (physical) Mobility (physical): Bed mobility;Transfers;Gait;Stairs   Mobility Comments: household ambulator without AD ADLs Comments: assisted by family     Hand Dominance   Dominant Hand: Right    Extremity/Trunk Assessment   Upper Extremity Assessment Upper Extremity Assessment: Generalized weakness;LUE deficits/detail LUE Deficits / Details: grossly -2/5 LUE Sensation: decreased proprioception LUE Coordination: decreased gross motor;decreased fine motor    Lower Extremity Assessment Lower Extremity Assessment: Generalized weakness;LLE deficits/detail LLE Deficits / Details: grossly -3 to +3/5 LLE Sensation: decreased proprioception LLE Coordination: decreased gross motor;decreased fine motor    Cervical / Trunk Assessment Cervical / Trunk Assessment: Normal  Communication   Communication: No difficulties  Cognition Arousal/Alertness: Awake/alert Behavior During Therapy: WFL for tasks assessed/performed Overall Cognitive Status: Within Functional Limits for tasks assessed                                          General  Comments      Exercises     Assessment/Plan    PT Assessment Patient needs continued PT services  PT Problem List Decreased strength;Decreased activity tolerance;Decreased balance;Decreased mobility;Decreased coordination       PT Treatment Interventions DME instruction;Gait training;Stair training;Functional mobility training;Therapeutic exercise;Balance training;Neuromuscular re-education;Patient/family education;Therapeutic activities    PT Goals (Current goals can be found in the Care Plan section)  Acute Rehab PT Goals Patient Stated Goal: return home with family to assist PT Goal Formulation: With patient Time For Goal Achievement: 10/26/21 Potential to Achieve Goals: Fair    Frequency Min 3X/week   Barriers to discharge        Co-evaluation               AM-PAC PT "6 Clicks" Mobility  Outcome Measure Help needed turning from your back to your side while in a flat bed without using bedrails?: A Lot Help needed moving from lying on your back to sitting on the side of a flat bed without using bedrails?: A Lot Help needed moving to and from a bed to a chair (including a wheelchair)?: A Lot Help needed standing up from a chair using your arms (e.g., wheelchair or bedside chair)?: A Lot Help needed to walk in hospital room?: A Lot Help needed climbing 3-5 steps with a railing? : Total 6 Click Score: 11    End of Session Equipment Utilized During Treatment:  Gait belt Activity Tolerance: Patient tolerated treatment well;Patient limited by fatigue Patient left: in bed;with call bell/phone within reach Nurse Communication: Mobility status PT Visit Diagnosis: Unsteadiness on feet (R26.81);Other abnormalities of gait and mobility (R26.89);Muscle weakness (generalized) (M62.81)    Time: 4235-3614 PT Time Calculation (min) (ACUTE ONLY): 25 min   Charges:   PT Evaluation $PT Eval Moderate Complexity: 1 Mod PT Treatments $Therapeutic Activity: 8-22 mins         8:36 AM, 10/05/21 Mearl Latin PT, DPT Physical Therapist at [facility]

## 2021-10-05 NOTE — TOC Transition Note (Signed)
Transition of Care [facility]) - CM/SW Discharge Note   Patient Details  Name: [name] Riley MRN: [id number] Date of Birth: [birth year]  [facility] of [facility] CM/SW Contact:  Shade Flood, LCSW Phone Number: 10/05/2021, 1:25 PM   Clinical Narrative:     Pt admitted from home. PT recommending SNF rehab. Spoke with pt's husband and he wants to take her home with continued [name] services. He would also like a 3in1 ordered. Arranged for 3in1 to be delivered to the home. Notified Linda at [facility] of pt's dc.  There are no other TOC needs for dc.  Expected Discharge Plan: [place] Barriers to Discharge: Barriers Resolved   Patient Goals and CMS Choice Patient states their goals for this hospitalization and ongoing recovery are:: go home for Christmas CMS Medicare.gov Compare Post Acute Care list provided to:: Patient Represenative (must comment) Choice offered to / list presented to : Spouse  Expected Discharge Plan and Services Expected Discharge Plan: [place] Acute Care Choice: Home Health, Durable Medical Equipment Living arrangements for the past 2 months: [place] Expected Discharge Date: 10/05/21               DME Arranged: 3-N-1 DME Agency: [employer] Date DME Agency Contacted: 10/05/21   Representative spoke with at DME Agency: Caryl Pina HH Arranged: OT, [place], Social Work, Therapist, sports HH Agency: [place] ([place]) Date [place]: 10/05/21   Representative spoke with at [place] Arrangements/Services Living arrangements for the past 2 months: [place] Lives with:: Spouse Patient language and need for interpreter reviewed:: Yes Do you feel safe going back to the place where you live?: Yes      Need for Family Participation in Patient Care: Yes (Comment) Care giver support system in place?: Yes (comment) Current home services: DME, Home PT, Home RN, Home OT Criminal  Activity/Legal Involvement Pertinent to Current Situation/Hospitalization: No - Comment as needed  Activities of Daily Living Home Assistive Devices/Equipment: None ADL Screening (condition at time of admission) Patient's cognitive ability adequate to safely complete daily activities?: Yes Is the patient deaf or have difficulty hearing?: No Does the patient have difficulty seeing, even when wearing glasses/contacts?: Yes Does the patient have difficulty concentrating, remembering, or making decisions?: No Patient able to express need for assistance with ADLs?: Yes Does the patient have difficulty dressing or bathing?: Yes Independently performs ADLs?: Yes (appropriate for developmental age) Does the patient have difficulty walking or climbing stairs?: Yes Weakness of Legs: Left Weakness of Arms/Hands: Left  Permission Sought/Granted Permission sought to share information with : Facility Art therapist granted to share information with : Yes, Verbal Permission Granted     Permission granted to share info w AGENCY: DME, [employer]        Emotional Assessment       Orientation: : Oriented to Self, Oriented to Place, Oriented to  Time, Oriented to Situation Alcohol / Substance Use: Not Applicable Psych Involvement: No (comment)  Admission diagnosis:  Partial seizure ([place]) [R56.9] Seizure disorder ([place]) [G40.909] 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] Patient Active Problem List   Diagnosis Date Noted   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   Vasogenic brain edema ([place])    Seizure ([place]) 09/24/2021   Seizures ([place]) 08/31/2021   Fall 03/11/2021   Left hip pain 03/11/2021   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   Seizure disorder ([place]) 10/29/2020   Weakness of  left upper extremity 10/29/2020   Thyroid disease 10/29/2020   Hyperlipidemia 10/29/2020   Brain metastasis ([place]) 07/13/2018   Medication management 06/11/2018   Goals of care, counseling/discussion  06/05/2018   Sepsis secondary to UTI ([place]) 05/31/2018   Thrombocytopenia ([place]) 05/31/2018   Hypokalemia 05/31/2018   Port-A-Cath in place 03/23/2018   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   Elevated LFTs    Metastatic breast cancer ([place]) 06/29/2017   Nodule of skin of breast 03/06/2017   Abscess, abdomen 07/06/2016   Breast cancer metastasized to bone ([place]) 06/01/2016   Pneumothorax on left 04/25/2016   Dyslipidemia 10/05/2015   Vitamin D deficiency 10/05/2015   Urinary tract infection without hematuria 10/29/2014   Chest pain 10/23/2014   Radiation-induced dermatitis 07/27/2012   Hypertension    Breast cancer of upper-outer quadrant of left female breast ([place]) 09/01/2011   Hypothyroidism 06/12/2009   Mixed hyperlipidemia 06/12/2009   Essential (primary) hypertension 06/12/2009   Coronary atherosclerosis 06/12/2009   DIZZINESS 06/12/2009   PALPITATIONS 06/12/2009   PCP:  [name], MD Pharmacy:   [facility] [address] [place] [address] Phone: [phone] Fax: [place] [address] Phone: [phone] Fax: [phone]     Social Determinants of Health (SDOH) Interventions    Readmission Risk Interventions No flowsheet data found.   Final next level of care: [place] Barriers to Discharge: Barriers Resolved   Patient Goals and CMS Choice Patient states their goals for this hospitalization and ongoing recovery are:: go home for Christmas CMS Medicare.gov Compare Post Acute Care list provided to:: Patient Represenative (must comment) Choice offered to / list presented to : Spouse  Discharge Placement                       Discharge Plan and Services     Post Acute Care Choice: Home Health, Durable Medical Equipment          DME Arranged: 3-N-1 DME Agency: [employer] Date DME Agency  Contacted: 10/05/21   Representative spoke with at DME Agency: Caryl Pina HH Arranged: OT, PT, Social Work, Therapist, sports [place] Agency: [place] ([place]) Date [place]: 10/05/21   Representative spoke with at [place]: [place] ([place]) Interventions     Readmission Risk Interventions No flowsheet data found.

## 2021-10-05 NOTE — Plan of Care (Signed)

## 2021-10-05 NOTE — Plan of Care (Signed)
°  Problem: Acute Rehab PT Goals(only PT should resolve) Goal: Pt Will Go Supine/Side To Sit Outcome: Progressing Flowsheets (Taken 10/05/2021 0837) Pt will go Supine/Side to Sit:  with minimal assist  with moderate assist Goal: Pt Will Go Sit To Supine/Side Outcome: Progressing Flowsheets (Taken 10/05/2021 0837) Pt will go Sit to Supine/Side: with moderate assist Goal: Patient Will Transfer Sit To/From Stand Outcome: Progressing Flowsheets (Taken 10/05/2021 [phone]) Patient will transfer sit to/from stand: with moderate assist Goal: Pt Will Transfer Bed To Chair/Chair To Bed Outcome: Progressing Flowsheets (Taken 10/05/2021 0837) Pt will Transfer Bed to Chair/Chair to Bed: with mod assist Goal: Pt/caregiver will Perform Home Exercise Program Outcome: Progressing Flowsheets (Taken 10/05/2021 0837) Pt/caregiver will Perform Home Exercise Program:  For increased strengthening  For improved balance  With Supervision, verbal cues required/provided  8:38 AM, 10/05/21 Mearl Latin PT, DPT Physical Therapist at [facility]

## 2021-10-05 NOTE — Progress Notes (Signed)
Nsg Discharge Note  Admit Date:  10/04/2021 Discharge date: 10/05/2021   [name] Riley to be D/C'd Home per MD order.  AVS completed.  Copy for chart, and copy for patient signed, and dated. Patient/caregiver able to verbalize understanding.  Discharge Medication: Allergies as of 10/05/2021       Reactions   Cantaloupe Extract Allergy Skin Test Shortness Of Breath   Contrast Media [iodinated Diagnostic Agents] Shortness Of Breath, Rash   Pravastatin Other (See Comments)   Legs hurt   Zosyn [piperacillin Sod-tazobactam So] Rash, Other (See Comments)   Temperature increase, facial flushing   Latex Rash   Redness, itch        Medication List     STOP taking these medications    lidocaine-prilocaine cream Commonly known as: EMLA   ondansetron 4 MG disintegrating tablet Commonly known as: ZOFRAN-ODT   potassium chloride SA 20 MEQ tablet Commonly known as: KLOR-CON M   rosuvastatin 5 MG tablet Commonly known as: CRESTOR   triamcinolone 0.025 % ointment Commonly known as: KENALOG   valsartan 80 MG tablet Commonly known as: DIOVAN       TAKE these medications    acetaminophen 500 MG tablet Commonly known as: TYLENOL Take 1,000 mg by mouth every 6 (six) hours as needed for moderate pain.   cholecalciferol 1000 units tablet Commonly known as: VITAMIN D Take 1 tablet (1,000 Units total) by mouth daily.   dexamethasone 4 MG tablet Commonly known as: DECADRON Take 1 tablet (4 mg total) by mouth daily. Start the day after chemotherapy for 2 days. What changed: Another medication with the same name was removed. Continue taking this medication, and follow the directions you see here.   furosemide 20 MG tablet Commonly known as: LASIX TAKE ONE (1) TABLET BY MOUTH EVERY DAY   levETIRAcetam 1000 MG tablet Commonly known as: KEPPRA Take 2 tablets (2,000 mg total) by mouth 2 (two) times daily. What changed:  medication strength how much to take   levothyroxine  100 MCG tablet Commonly known as: SYNTHROID TAKE ONE TABLET EACH MORNING BEFORE BREAKFAST   LORazepam 1 MG tablet Commonly known as: Ativan Take 1 tablet (1 mg total) by mouth 3 (three) times daily as needed for up to 8 doses for seizure (Take 1 tablet by mouth if you have recurrent episodes of left hand shaking.).   metoprolol succinate 100 MG 24 hr tablet Commonly known as: TOPROL-XL Take with or immediately following a meal. What changed:  how much to take how to take this when to take this   multivitamin with minerals tablet Take 1 tablet by mouth daily.   ondansetron 8 MG tablet Commonly known as: Zofran Take 1 tablet (8 mg total) by mouth 2 (two) times daily as needed for refractory nausea / vomiting. Start on day 3 after chemo.   pantoprazole 40 MG tablet Commonly known as: Protonix Take 1 tablet (40 mg total) by mouth daily.   prochlorperazine 10 MG tablet Commonly known as: COMPAZINE Take 1 tablet (10 mg total) by mouth every 6 (six) hours as needed (Nausea or vomiting).   traZODone 50 MG tablet Commonly known as: DESYREL Take 1 tablet (50 mg total) by mouth at bedtime.   VITAMIN C PO Take by mouth.               Durable Medical Equipment  (From admission, onward)           Start     Ordered  10/05/21 1208  For home use only DME 3 n 1  Once        10/05/21 1207            Discharge Assessment: Vitals:   10/05/21 0830 10/05/21 1032  BP: 110/64 115/67  Pulse: 74 70  Resp: 20   Temp: 98.4 F (36.9 C)   SpO2: 97%    Skin clean, dry and intact without evidence of skin break down, no evidence of skin tears noted. IV catheter discontinued intact. Site without signs and symptoms of complications - no redness or edema noted at insertion site, patient denies c/o pain - only slight tenderness at site.  Dressing with slight pressure applied.  D/c Instructions-Education: Discharge instructions given to patient/family with verbalized  understanding. D/c education completed with patient/family including follow up instructions, medication list, d/c activities limitations if indicated, with other d/c instructions as indicated by MD - patient able to verbalize understanding, all questions fully answered. Patient instructed to return to ED, call 911, or call MD for any changes in condition.  Patient escorted via [place], and D/C home via private auto.  Dorcas Mcmurray, RN 10/05/2021 2:37 PM

## 2021-10-06 ENCOUNTER — Telehealth: Payer: Self-pay | Admitting: Internal Medicine

## 2021-10-06 ENCOUNTER — Inpatient Hospital Stay: Payer: Medicare Other

## 2021-10-06 NOTE — Telephone Encounter (Signed)
Scheduled per sch msg. Called, not able to leave msg. Mailed printout  °

## 2021-10-07 ENCOUNTER — Ambulatory Visit (INDEPENDENT_AMBULATORY_CARE_PROVIDER_SITE_OTHER): Payer: Medicare Other

## 2021-10-07 ENCOUNTER — Other Ambulatory Visit: Payer: Self-pay

## 2021-10-07 DIAGNOSIS — I2510 Atherosclerotic heart disease of native coronary artery without angina pectoris: Secondary | ICD-10-CM

## 2021-10-07 DIAGNOSIS — G8194 Hemiplegia, unspecified affecting left nondominant side: Secondary | ICD-10-CM

## 2021-10-07 DIAGNOSIS — I10 Essential (primary) hypertension: Secondary | ICD-10-CM

## 2021-10-07 DIAGNOSIS — E46 Unspecified protein-calorie malnutrition: Secondary | ICD-10-CM

## 2021-10-07 DIAGNOSIS — E039 Hypothyroidism, unspecified: Secondary | ICD-10-CM

## 2021-10-07 DIAGNOSIS — Z7952 Long term (current) use of systemic steroids: Secondary | ICD-10-CM

## 2021-10-07 DIAGNOSIS — R569 Unspecified convulsions: Secondary | ICD-10-CM | POA: Diagnosis not present

## 2021-10-07 DIAGNOSIS — C7931 Secondary malignant neoplasm of brain: Secondary | ICD-10-CM

## 2021-10-07 DIAGNOSIS — C50912 Malignant neoplasm of unspecified site of left female breast: Secondary | ICD-10-CM

## 2021-10-07 DIAGNOSIS — G936 Cerebral edema: Secondary | ICD-10-CM

## 2021-10-07 DIAGNOSIS — E785 Hyperlipidemia, unspecified: Secondary | ICD-10-CM

## 2021-10-14 ENCOUNTER — Inpatient Hospital Stay: Payer: Medicare Other

## 2021-10-14 ENCOUNTER — Encounter (HOSPITAL_COMMUNITY): Payer: Self-pay | Admitting: Emergency Medicine

## 2021-10-14 ENCOUNTER — Inpatient Hospital Stay: Payer: Medicare Other | Admitting: Adult Health

## 2021-10-14 ENCOUNTER — Other Ambulatory Visit: Payer: Self-pay

## 2021-10-14 ENCOUNTER — Inpatient Hospital Stay (HOSPITAL_COMMUNITY)
Admission: EM | Admit: 2021-10-14 | Discharge: 2021-10-20 | DRG: 100 | Disposition: A | Discharge status: Rehabilitation Facility | Payer: Medicare Other | Attending: Internal Medicine | Admitting: Internal Medicine

## 2021-10-14 ENCOUNTER — Emergency Department (HOSPITAL_COMMUNITY): Payer: Medicare Other

## 2021-10-14 ENCOUNTER — Inpatient Hospital Stay (HOSPITAL_BASED_OUTPATIENT_CLINIC_OR_DEPARTMENT_OTHER): Payer: Medicare Other | Admitting: Internal Medicine

## 2021-10-14 VITALS — BP 110/60 | HR 73 | Temp 97.5°F | Resp 15

## 2021-10-14 DIAGNOSIS — Z66 Do not resuscitate: Secondary | ICD-10-CM | POA: Diagnosis present

## 2021-10-14 DIAGNOSIS — C50919 Malignant neoplasm of unspecified site of unspecified female breast: Secondary | ICD-10-CM | POA: Diagnosis not present

## 2021-10-14 DIAGNOSIS — L0390 Cellulitis, unspecified: Secondary | ICD-10-CM | POA: Diagnosis present

## 2021-10-14 DIAGNOSIS — I10 Essential (primary) hypertension: Secondary | ICD-10-CM | POA: Diagnosis present

## 2021-10-14 DIAGNOSIS — Z853 Personal history of malignant neoplasm of breast: Secondary | ICD-10-CM

## 2021-10-14 DIAGNOSIS — E039 Hypothyroidism, unspecified: Secondary | ICD-10-CM | POA: Diagnosis not present

## 2021-10-14 DIAGNOSIS — I5032 Chronic diastolic (congestive) heart failure: Secondary | ICD-10-CM | POA: Diagnosis not present

## 2021-10-14 DIAGNOSIS — Z20822 Contact with and (suspected) exposure to covid-19: Secondary | ICD-10-CM | POA: Diagnosis present

## 2021-10-14 DIAGNOSIS — Z79890 Hormone replacement therapy (postmenopausal): Secondary | ICD-10-CM

## 2021-10-14 DIAGNOSIS — C787 Secondary malignant neoplasm of liver and intrahepatic bile duct: Secondary | ICD-10-CM | POA: Diagnosis present

## 2021-10-14 DIAGNOSIS — C50912 Malignant neoplasm of unspecified site of left female breast: Secondary | ICD-10-CM | POA: Diagnosis present

## 2021-10-14 DIAGNOSIS — Z90710 Acquired absence of both cervix and uterus: Secondary | ICD-10-CM | POA: Diagnosis not present

## 2021-10-14 DIAGNOSIS — Z8249 Family history of ischemic heart disease and other diseases of the circulatory system: Secondary | ICD-10-CM

## 2021-10-14 DIAGNOSIS — R569 Unspecified convulsions: Secondary | ICD-10-CM

## 2021-10-14 DIAGNOSIS — K7460 Unspecified cirrhosis of liver: Secondary | ICD-10-CM | POA: Diagnosis present

## 2021-10-14 DIAGNOSIS — D696 Thrombocytopenia, unspecified: Secondary | ICD-10-CM | POA: Diagnosis present

## 2021-10-14 DIAGNOSIS — Z993 Dependence on wheelchair: Secondary | ICD-10-CM

## 2021-10-14 DIAGNOSIS — I2510 Atherosclerotic heart disease of native coronary artery without angina pectoris: Secondary | ICD-10-CM

## 2021-10-14 DIAGNOSIS — Z515 Encounter for palliative care: Secondary | ICD-10-CM | POA: Diagnosis not present

## 2021-10-14 DIAGNOSIS — C7931 Secondary malignant neoplasm of brain: Secondary | ICD-10-CM | POA: Diagnosis present

## 2021-10-14 DIAGNOSIS — G40919 Epilepsy, unspecified, intractable, without status epilepticus: Secondary | ICD-10-CM | POA: Diagnosis present

## 2021-10-14 DIAGNOSIS — Z9013 Acquired absence of bilateral breasts and nipples: Secondary | ICD-10-CM

## 2021-10-14 DIAGNOSIS — G936 Cerebral edema: Secondary | ICD-10-CM | POA: Diagnosis present

## 2021-10-14 DIAGNOSIS — G40901 Epilepsy, unspecified, not intractable, with status epilepticus: Principal | ICD-10-CM | POA: Diagnosis present

## 2021-10-14 DIAGNOSIS — Z833 Family history of diabetes mellitus: Secondary | ICD-10-CM | POA: Diagnosis not present

## 2021-10-14 DIAGNOSIS — G40101 Localization-related (focal) (partial) symptomatic epilepsy and epileptic syndromes with simple partial seizures, not intractable, with status epilepticus: Secondary | ICD-10-CM | POA: Diagnosis present

## 2021-10-14 DIAGNOSIS — Z170 Estrogen receptor positive status [ER+]: Secondary | ICD-10-CM | POA: Diagnosis not present

## 2021-10-14 DIAGNOSIS — Z79899 Other long term (current) drug therapy: Secondary | ICD-10-CM | POA: Diagnosis not present

## 2021-10-14 DIAGNOSIS — C7951 Secondary malignant neoplasm of bone: Secondary | ICD-10-CM | POA: Diagnosis present

## 2021-10-14 DIAGNOSIS — Z801 Family history of malignant neoplasm of trachea, bronchus and lung: Secondary | ICD-10-CM

## 2021-10-14 DIAGNOSIS — G8384 Todd's paralysis (postepileptic): Secondary | ICD-10-CM

## 2021-10-14 DIAGNOSIS — I252 Old myocardial infarction: Secondary | ICD-10-CM

## 2021-10-14 DIAGNOSIS — Z91040 Latex allergy status: Secondary | ICD-10-CM

## 2021-10-14 DIAGNOSIS — G40909 Epilepsy, unspecified, not intractable, without status epilepticus: Secondary | ICD-10-CM | POA: Diagnosis not present

## 2021-10-14 DIAGNOSIS — I110 Hypertensive heart disease with heart failure: Secondary | ICD-10-CM | POA: Diagnosis present

## 2021-10-14 DIAGNOSIS — Z888 Allergy status to other drugs, medicaments and biological substances status: Secondary | ICD-10-CM

## 2021-10-14 DIAGNOSIS — C7802 Secondary malignant neoplasm of left lung: Secondary | ICD-10-CM | POA: Diagnosis present

## 2021-10-14 DIAGNOSIS — Z91018 Allergy to other foods: Secondary | ICD-10-CM

## 2021-10-14 DIAGNOSIS — R414 Neurologic neglect syndrome: Secondary | ICD-10-CM | POA: Diagnosis present

## 2021-10-14 DIAGNOSIS — G4700 Insomnia, unspecified: Secondary | ICD-10-CM | POA: Diagnosis present

## 2021-10-14 DIAGNOSIS — Z7189 Other specified counseling: Secondary | ICD-10-CM | POA: Diagnosis not present

## 2021-10-14 DIAGNOSIS — Z923 Personal history of irradiation: Secondary | ICD-10-CM | POA: Diagnosis not present

## 2021-10-14 DIAGNOSIS — D6959 Other secondary thrombocytopenia: Secondary | ICD-10-CM | POA: Diagnosis present

## 2021-10-14 DIAGNOSIS — C7801 Secondary malignant neoplasm of right lung: Secondary | ICD-10-CM | POA: Diagnosis present

## 2021-10-14 DIAGNOSIS — N390 Urinary tract infection, site not specified: Secondary | ICD-10-CM | POA: Diagnosis present

## 2021-10-14 DIAGNOSIS — E785 Hyperlipidemia, unspecified: Secondary | ICD-10-CM | POA: Diagnosis present

## 2021-10-14 DIAGNOSIS — Z91041 Radiographic dye allergy status: Secondary | ICD-10-CM

## 2021-10-14 DIAGNOSIS — C7901 Secondary malignant neoplasm of right kidney and renal pelvis: Secondary | ICD-10-CM | POA: Diagnosis present

## 2021-10-14 DIAGNOSIS — R748 Abnormal levels of other serum enzymes: Secondary | ICD-10-CM

## 2021-10-14 DIAGNOSIS — R1110 Vomiting, unspecified: Secondary | ICD-10-CM

## 2021-10-14 DIAGNOSIS — K5900 Constipation, unspecified: Secondary | ICD-10-CM | POA: Diagnosis present

## 2021-10-14 DIAGNOSIS — Z9221 Personal history of antineoplastic chemotherapy: Secondary | ICD-10-CM

## 2021-10-14 DIAGNOSIS — R52 Pain, unspecified: Secondary | ICD-10-CM

## 2021-10-14 DIAGNOSIS — B9620 Unspecified Escherichia coli [E. coli] as the cause of diseases classified elsewhere: Secondary | ICD-10-CM | POA: Diagnosis present

## 2021-10-14 DIAGNOSIS — R7989 Other specified abnormal findings of blood chemistry: Secondary | ICD-10-CM | POA: Diagnosis not present

## 2021-10-14 DIAGNOSIS — M7989 Other specified soft tissue disorders: Secondary | ICD-10-CM

## 2021-10-14 DIAGNOSIS — G8191 Hemiplegia, unspecified affecting right dominant side: Secondary | ICD-10-CM | POA: Diagnosis present

## 2021-10-14 DIAGNOSIS — G8194 Hemiplegia, unspecified affecting left nondominant side: Secondary | ICD-10-CM | POA: Diagnosis not present

## 2021-10-14 DIAGNOSIS — R5381 Other malaise: Secondary | ICD-10-CM | POA: Diagnosis present

## 2021-10-14 HISTORY — DX: Unspecified convulsions: R56.9

## 2021-10-14 LAB — CBC WITH DIFFERENTIAL/PLATELET
Abs Immature Granulocytes: 0.27 10*3/uL — ABNORMAL HIGH (ref 0.00–0.07)
Basophils Absolute: 0 10*3/uL (ref 0.0–0.1)
Basophils Relative: 0 %
Eosinophils Absolute: 0 10*3/uL (ref 0.0–0.5)
Eosinophils Relative: 0 %
HCT: 35.8 % — ABNORMAL LOW (ref 36.0–46.0)
Hemoglobin: 12 g/dL (ref 12.0–15.0)
Immature Granulocytes: 3 %
Lymphocytes Relative: 17 %
Lymphs Abs: 1.4 10*3/uL (ref 0.7–4.0)
MCH: 36 pg — ABNORMAL HIGH (ref 26.0–34.0)
MCHC: 33.5 g/dL (ref 30.0–36.0)
MCV: 107.5 fL — ABNORMAL HIGH (ref 80.0–100.0)
Monocytes Absolute: 0.8 10*3/uL (ref 0.1–1.0)
Monocytes Relative: 10 %
Neutro Abs: 5.7 10*3/uL (ref 1.7–7.7)
Neutrophils Relative %: 70 %
Platelets: 92 10*3/uL — ABNORMAL LOW (ref 150–400)
RBC: 3.33 MIL/uL — ABNORMAL LOW (ref 3.87–5.11)
RDW: 14.4 % (ref 11.5–15.5)
WBC: 8.2 10*3/uL (ref 4.0–10.5)
nRBC: 0.4 % — ABNORMAL HIGH (ref 0.0–0.2)

## 2021-10-14 LAB — BASIC METABOLIC PANEL
Anion gap: 9 (ref 5–15)
BUN: 27 mg/dL — ABNORMAL HIGH (ref 8–23)
CO2: 28 mmol/L (ref 22–32)
Calcium: 9.5 mg/dL (ref 8.9–10.3)
Chloride: 102 mmol/L (ref 98–111)
Creatinine, Ser: 0.67 mg/dL (ref 0.44–1.00)
GFR, Estimated: >60 mL/min (ref >60)
Glucose, Bld: 90 mg/dL (ref 70–99)
Potassium: 4 mmol/L (ref 3.5–5.1)
Sodium: 139 mmol/L (ref 135–145)

## 2021-10-14 MED ORDER — METOPROLOL SUCCINATE ER 25 MG PO TB24: 100.0000 mg | ORAL_TABLET | Freq: Every day | ORAL | Status: DC

## 2021-10-14 MED ORDER — ONDANSETRON HCL 4 MG/2ML IJ SOLN: 4.0000 mg | Freq: Four times a day (QID) | INTRAMUSCULAR | Status: DC | PRN

## 2021-10-14 MED ORDER — LEVETIRACETAM 500 MG PO TABS
2000.0000 mg | ORAL_TABLET | Freq: Once | ORAL | Status: AC
  Administered 2021-10-14: 20:00:00 2000 mg via ORAL
  Filled 2021-10-14: qty 4

## 2021-10-14 MED ORDER — DEXAMETHASONE 4 MG PO TABS
4.0000 mg | ORAL_TABLET | Freq: Every day | ORAL | Status: DC
  Administered 2021-10-15 – 2021-10-20 (×6): 4 mg via ORAL
  Filled 2021-10-14 (×7): qty 1

## 2021-10-14 MED ORDER — ACETAMINOPHEN 650 MG RE SUPP: 325.0000 mg | Freq: Four times a day (QID) | RECTAL | Status: DC | PRN

## 2021-10-14 MED ORDER — ACETAMINOPHEN 325 MG PO TABS
325.0000 mg | ORAL_TABLET | Freq: Four times a day (QID) | ORAL | Status: DC | PRN
  Administered 2021-10-15 – 2021-10-19 (×2): 325 mg via ORAL
  Filled 2021-10-14 (×2): qty 1

## 2021-10-14 MED ORDER — PANTOPRAZOLE SODIUM 40 MG PO TBEC
40.0000 mg | DELAYED_RELEASE_TABLET | Freq: Every day | ORAL | Status: DC
  Administered 2021-10-15 – 2021-10-20 (×6): 40 mg via ORAL
  Filled 2021-10-14 (×6): qty 1

## 2021-10-14 MED ORDER — LEVETIRACETAM 500 MG PO TABS
2000.0000 mg | ORAL_TABLET | Freq: Two times a day (BID) | ORAL | Status: DC
  Administered 2021-10-15: 08:00:00 2000 mg via ORAL
  Filled 2021-10-14: qty 4

## 2021-10-14 MED ORDER — TRAZODONE HCL 50 MG PO TABS
50.0000 mg | ORAL_TABLET | Freq: Every day | ORAL | Status: DC
  Administered 2021-10-14 – 2021-10-19 (×6): 50 mg via ORAL
  Filled 2021-10-14 (×6): qty 1

## 2021-10-14 MED ORDER — ONDANSETRON HCL 4 MG PO TABS: 4.0000 mg | ORAL_TABLET | Freq: Four times a day (QID) | ORAL | Status: DC | PRN

## 2021-10-14 MED ORDER — SODIUM CHLORIDE 0.9% FLUSH
3.0000 mL | Freq: Two times a day (BID) | INTRAVENOUS | Status: DC
  Administered 2021-10-15 – 2021-10-20 (×10): 3 mL via INTRAVENOUS

## 2021-10-14 MED ORDER — LEVOTHYROXINE SODIUM 100 MCG PO TABS
100.0000 ug | ORAL_TABLET | Freq: Every day | ORAL | Status: DC
  Administered 2021-10-15 – 2021-10-20 (×6): 100 ug via ORAL
  Filled 2021-10-14 (×6): qty 1

## 2021-10-14 MED ORDER — BISACODYL 5 MG PO TBEC
5.0000 mg | DELAYED_RELEASE_TABLET | Freq: Every day | ORAL | Status: DC | PRN
  Administered 2021-10-16: 5 mg via ORAL
  Filled 2021-10-14 (×2): qty 1

## 2021-10-14 MED ORDER — FUROSEMIDE 20 MG PO TABS
20.0000 mg | ORAL_TABLET | Freq: Every day | ORAL | Status: DC
  Administered 2021-10-15 – 2021-10-20 (×6): 20 mg via ORAL
  Filled 2021-10-14 (×6): qty 1

## 2021-10-14 NOTE — ED Triage Notes (Addendum)
Pt states she was sent to ED from neurologist office for EEG and admission.  Reports she had her 3rd seizure last weekend.  Taking Keppra.  Denies any complaints at present except for being nervous and confused.  Pt alert and oriented at this time.  Pt sitting in wheelchair with L arm straight down.  Questioned if she had L arm weakness and she is able to lift L arm some against gravity but had to use R arm to hold L arm up.  States L arm has been weak since her last seizure.

## 2021-10-14 NOTE — ED Notes (Signed)
PT in MRI. Will bring when exam is done.

## 2021-10-14 NOTE — ED Provider Notes (Signed)
[facility] EMERGENCY DEPARTMENT Provider Note   CSN: [id number] Arrival date & time: 10/14/21  1121     History No chief complaint on file.   [name] Riley is a 70 y.o. female.  Patient was referred to the emergency department by Dr. Mickeal Skinner to be admitted for continuous EEG and to have MRI of brain.  Patient has been having generalized seizures.  She is on Keppra, her doses have been increased.  Patient with left-sided weakness that has been present since her last seizure 2 weeks ago.      Past Medical History:  Diagnosis Date   Allergy    Breast cancer ([place]) 08/25/2011   L , invasive ductal carcinoma, ER/PR +,HER2 -   Cancer ([place])    left breast cancer   Coronary artery disease 2001   Heart attack [facility]) 09/2000   Sep 25, 2000  --no intervention   History of chemotherapy comp. 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   Seizure disorder ([place]) 10/29/2020   Weakness of left upper extremity 10/29/2020   Thyroid disease 10/29/2020   Hyperlipidemia 10/29/2020   Brain metastasis ([place]) 07/13/2018   Medication management 06/11/2018   Goals of care, counseling/discussion 06/05/2018   Sepsis secondary to UTI ([place]) 05/31/2018   Thrombocytopenia ([place]) 05/31/2018   Hypokalemia 05/31/2018   Port-A-Cath in place 03/23/2018   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   Elevated LFTs    Metastatic breast cancer ([place]) 06/29/2017    Nodule of skin of breast 03/06/2017   Abscess, abdomen 07/06/2016   Breast cancer metastasized to bone ([place]) 06/01/2016   Pneumothorax on left 04/25/2016   Dyslipidemia 10/05/2015   Vitamin D deficiency 10/05/2015   Urinary tract infection without hematuria 10/29/2014   Chest pain 10/23/2014   Radiation-induced dermatitis 07/27/2012   Hypertension    Breast cancer of upper-outer quadrant of left female breast ([place]) 09/01/2011   Hypothyroidism 06/12/2009   Mixed hyperlipidemia 06/12/2009   Essential (primary) hypertension 06/12/2009   Coronary atherosclerosis 06/12/2009   DIZZINESS 06/12/2009   PALPITATIONS 06/12/2009    Past Surgical History:  Procedure Laterality Date   ABDOMINAL HYSTERECTOMY  1998   TAH, oophorectomy   APPENDECTOMY  1970   BREAST CYST EXCISION Left 07/29/2020   Procedure: WIDE EXCISION OF LEFT MASTECTOMY INCISION;  Surgeon: Donnie Mesa, MD;  Location: [facility];  Service: General;  Laterality: Left;   [place]   removal of benign lump in rt breast   BREAST SURGERY  11/14/11   right simple mastectomy, left mrm   INCISION AND DRAINAGE OF WOUND Left 07/01/2017   Procedure: IRRIGATION AND DEBRIDEMENT CHEST WALL ABSCESS;  Surgeon: Donnie Mesa, MD;  Location: [facility];  Service: General;  Laterality: Left;   MASS EXCISION Left 06/22/2017   Procedure: EXCISION OF CHEST WALL MASSES;  Surgeon: Donnie Mesa, MD;  Location: [facility];  Service: General;  Laterality: Left;   MASTECTOMY Bilateral    for left breast cancer   OVARIAN CYST SURGERY Right 1970   PORT-A-CATH REMOVAL  08/30/2012   Procedure: REMOVAL PORT-A-CATH;  Surgeon: Imogene Burn. [name], MD;  Location: [facility];  Service: General;  Laterality: Right;  port removal   PORTACATH PLACEMENT  11/14/2011   Procedure: INSERTION PORT-A-CATH;  Surgeon: Imogene Burn. [name], MD;  Location: [facility];  Service: General;  Laterality: Right;   PORTACATH PLACEMENT N/A 04/25/2016    Procedure: INSERTION PORT-A-CATH LEFT CHEST;  Surgeon: Donnie Mesa, MD;  Location: [facility];  Service: General;  Laterality: N/A;   skin tags  05/09/1997   left axillary left neck skin tags   TONSILLECTOMY  1968     OB History     Gravida  4   Para      Term      Preterm      AB  1   Living  3      SAB  1   IAB      Ectopic      Multiple      Live Births              Family History  Problem Relation Age of Onset   Hypertension Maternal Grandmother    Diabetes Maternal Grandmother    Cancer Father 72       lung cancer and Prostate Cancer   Hypertension Mother    Cancer Paternal Aunt        ovarian   Cancer Cousin        breast, paternal cousin   Cancer Paternal Uncle        stomach   Cancer Paternal Grandfather        Esophagus   Colon cancer Neg Hx     Social History   Tobacco Use   Smoking status: Never   Smokeless tobacco: Never  Vaping Use   Vaping Use: Never used  Substance Use Topics   Alcohol use: No   Drug use: No    Home Medications Prior to Admission medications   Medication Sig Start Date End Date Taking? Authorizing Provider  acetaminophen (TYLENOL) 500 MG tablet Take 1,000 mg by mouth every 6 (six) hours as needed for moderate pain.    [provider]  Ascorbic Acid (VITAMIN C PO) Take by mouth.    [provider]  cholecalciferol (VITAMIN D) 1000 units tablet Take 1 tablet (1,000 Units total) by mouth daily. 04/13/18   Nicholas Lose, MD  dexamethasone (DECADRON) 4 MG tablet Take 1 tablet (4 mg total) by mouth daily. Start the day after chemotherapy for 2 days. 09/27/21   Nicholas Lose, MD  furosemide (LASIX) 20 MG tablet TAKE ONE (1) TABLET BY MOUTH EVERY DAY 02/08/21   Nicholas Lose, MD  levETIRAcetam (KEPPRA) 1000 MG tablet Take 2 tablets (2,000 mg total) by mouth 2 (two) times daily. 10/05/21   Orson Eva, MD  levothyroxine (SYNTHROID) 100 MCG tablet TAKE ONE TABLET EACH MORNING BEFORE  BREAKFAST 09/13/21   [name], MD  LORazepam (ATIVAN) 1 MG tablet Take 1 tablet (1 mg total) by mouth 3 (three) times daily as needed for up to 8 doses for seizure (Take 1 tablet by mouth if you have recurrent episodes of left hand shaking.). 10/04/21   Luna Fuse, MD  metoprolol succinate (TOPROL-XL) 100 MG 24 hr tablet Take with or immediately  following a meal. Patient taking differently: Take 100 mg by mouth daily. Take with or immediately following a meal. 08/12/21   [name], MD  Multiple Vitamins-Minerals (MULTIVITAMIN WITH MINERALS) tablet Take 1 tablet by mouth daily.    [provider]  ondansetron (ZOFRAN) 8 MG tablet Take 1 tablet (8 mg total) by mouth 2 (two) times daily as needed for refractory nausea / vomiting. Start on day 3 after chemo. 09/27/21   Nicholas Lose, MD  pantoprazole (PROTONIX) 40 MG tablet Take 1 tablet (40 mg total) by mouth daily. 09/25/21 09/25/22  Barton Dubois, MD  prochlorperazine (COMPAZINE) 10 MG tablet Take 1 tablet (10 mg total) by mouth every 6 (six) hours as needed (Nausea or vomiting). 09/27/21   Nicholas Lose, MD  traZODone (DESYREL) 50 MG tablet Take 1 tablet (50 mg total) by mouth at bedtime. 10/05/21   Orson Eva, MD    Allergies    Cantaloupe extract allergy skin test, Contrast media [iodinated contrast media], Pravastatin, Zosyn [piperacillin sod-tazobactam so], and Latex  Review of Systems   Review of Systems  Neurological:  Positive for seizures, weakness and numbness.  All other systems reviewed and are negative.  Physical Exam Updated Vital Signs BP (!) 158/78    Pulse 78    Temp 98 F (36.7 C)    Resp 19    SpO2 98%   Physical Exam Vitals and nursing note reviewed.  Constitutional:      General: She is not in acute distress.    Appearance: Normal appearance. She is well-developed.  HENT:     Head: Normocephalic and atraumatic.     Right Ear: Hearing normal.     Left Ear: Hearing normal.     Nose: Nose  normal.  Eyes:     Conjunctiva/sclera: Conjunctivae normal.     Pupils: Pupils are equal, round, and reactive to light.  Cardiovascular:     Rate and Rhythm: Regular rhythm.     Heart sounds: S1 normal and S2 normal. No murmur heard.   No friction rub. No gallop.  Pulmonary:     Effort: Pulmonary effort is normal. No respiratory distress.     Breath sounds: Normal breath sounds.  Chest:     Chest wall: No tenderness.  Abdominal:     General: Bowel sounds are normal.     Palpations: Abdomen is soft.     Tenderness: There is no abdominal tenderness. There is no guarding or rebound. Negative signs include Murphy's sign and McBurney's sign.     Hernia: No hernia is present.  Musculoskeletal:        General: Normal range of motion.     Cervical back: Normal range of motion and neck supple.  Skin:    General: Skin is warm and dry.     Findings: No rash.  Neurological:     Mental Status: She is alert and oriented to person, place, and time.     GCS: GCS eye subscore is 4. GCS verbal subscore is 5. GCS motor subscore is 6.     Cranial Nerves: No cranial nerve deficit.     Sensory: Sensory deficit present.     Motor: Weakness present.     Coordination: Coordination normal.     Comments: Left hemiparesis  Psychiatric:        Speech: Speech normal.        Behavior: Behavior normal.        Thought Content: Thought content normal.    ED  Results / Procedures / Treatments   Labs (all labs ordered are listed, but only abnormal results are displayed) Labs Reviewed  CBC WITH DIFFERENTIAL/PLATELET - Abnormal; Notable for the following components:      Result Value   RBC 3.33 (*)    HCT 35.8 (*)    MCV 107.5 (*)    MCH 36.0 (*)    Platelets 92 (*)    nRBC 0.4 (*)    Abs Immature Granulocytes 0.27 (*)    All other components within normal limits  BASIC METABOLIC PANEL - Abnormal; Notable for the following components:   BUN 27 (*)    All other components within normal limits  CBG  MONITORING, ED    EKG None  Radiology MR BRAIN WO CONTRAST  Result Date: 10/14/2021 CLINICAL DATA:  Seizures.  Metastatic breast cancer. EXAM: MRI HEAD WITHOUT CONTRAST TECHNIQUE: Multiplanar, multiecho pulse sequences of the brain and surrounding structures were obtained without intravenous contrast. COMPARISON:  CT head without contrast 10/04/2021. MR head without contrast 09/24/2021. MR head without and with contrast 09/03/2021 FINDINGS: Brain: Continued progression of extensive white matter vasogenic edema is present in the high right frontal lobe and portions of the parietal lobe. There is increasing mass effect with effacement of the sulci. Minimal effacement of the right lateral ventricle noted. No significant midline shift is present. Two areas of cortical susceptibility are again noted in the right frontal and parietal lobe corresponding with the areas of enhancement. These areas are stable. No new foci of susceptibility are present. Focal T2 signal at the right temporal tip is stable. No significant white matter changes are present in the left hemisphere or cerebellum. Brainstem is unremarkable. T2 shine through in susceptibility is present without definite restricted diffusion. Basal ganglia are within normal limits. The internal auditory canals are unremarkable. No new discrete lesions are present. Vascular: Flow is present in the major intracranial arteries. Skull and upper cervical spine: Extensive osseous metastases are again noted. Sinuses/Orbits: The paranasal sinuses and mastoid air cells are clear. The globes and orbits are within normal limits. IMPRESSION: 1. Continued progression of extensive white matter vasogenic edema in the high right frontal lobe and portions of the right parietal lobe. PET scan 09/15/2021 demonstrated no increased activity relative to the adjacent areas in suggest tumor necrosis. Progressive white matter changes may reflect reactive disease rather than metastatic  progression. The diffuse white matter progression is likely associated with the seizure activity. 2. Stable areas of cortical susceptibility in the right frontal and parietal lobe corresponding with the areas of enhancement. These represent areas of treated metastases demonstrating calcification on the CT scan. 3. Stable focal T2 signal at the right temporal tip. 4. No new foci of susceptibility to suggest metastatic disease. 5. Extensive osseous metastases. Electronically Signed   By: San Morelle M.D.   On: 10/14/2021 18:47    Procedures Procedures   Medications Ordered in ED Medications  levETIRAcetam (KEPPRA) tablet 2,000 mg (has no administration in time range)    ED Course  I have reviewed the triage vital signs and the nursing notes.  Pertinent labs & imaging results that were available during my care of the patient were reviewed by me and considered in my medical decision making (see chart for details).    MDM Rules/Calculators/A&P                         Patient presents to the emergency department for evaluation of seizures.  Patient does have history of metastatic breast cancer.  She has been experiencing seizures, Keppra dosing has been titrated upwards.  Patient with left arm and leg weakness.  She has not had a seizure in 2 weeks, doubt this is a Todd's paralysis.  MRI with extensive vasogenic edema in the right frontal and right parietal region.  This is likely responsible for the increased seizures.     Final Clinical Impression(s) / ED Diagnoses Final diagnoses:  Seizure [facility])    Rx / DC Orders ED Discharge Orders     None        Hester Joslin, Gwenyth Allegra, MD 10/14/21 1929

## 2021-10-14 NOTE — Progress Notes (Signed)
[facility] [place], [place] [address] [phone]   Interval Evaluation  Date of Service: 10/14/21 Patient Name: [name] Riley Patient MRN: [id number] Patient DOB: Feb 11, 1951 Provider: Ventura Sellers, MD  Identifying Statement:  DEETYA DROUILLARD is a 70 y.o. female with seizures, brain metastases  Primary Cancer:  Oncologic History: Oncology History  Breast cancer of upper-outer quadrant of left female breast ([place])  11/14/2011 Surgery   Bilateral mastectomy, prophylactic on the right, left breast IDC 3/18 lymph nodes positive with extracapsular extension ER 89%, PR 81%, HER-2 negative, Ki-67 79% T2 N1 A. stage IIB   12/13/2011 - 06/28/2012 Chemotherapy   4 cycles of FEC followed by 4 cycles of Taxotere   07/17/2012 - 08/22/2012 Radiation Therapy   Adjuvant radiation therapy   08/22/2012 - 03/16/2016 Anti-estrogen oral therapy   Arimidex 1 mg daily   03/16/2016 Relapse/Recurrence   Subcutaneous nodule excision left chest: Infiltrating carcinoma breast primary, ER positive, PR negative   03/29/2016 Imaging   CT CAP and bone scan: Lytic lesions T8 vertebral, T1 posterior element, subcutaneous nodule left lateral chest wall, nonspecific lung nodules; Bone scan: Mets to kull, left humerus, left eighth rib, T7/T8, sternum, left acetabulum   04/28/2016 - 06/17/2017 Chemotherapy   Herceptin, lapatinib, Faslodex, Zometa every 4 weeks, lapatinib discontinued in September 2018 due to elevation of LFTs   06/22/2017 Relapse/Recurrence   Surgical excision:Soft tissue mass left lateral chest wall primary breast cancer, soft tissue mass left medial chest wall breast cancer, tumor is within the dermis extending to the subcutaneous adipose tissue and involves portions of skeletal muscle   06/22/2017 Cancer Staging   Staging form: Breast, AJCC 7th Edition - Pathologic stage from 06/22/2017: Stage IV (TX, NX, M1) - Signed by Nicholas Lose, MD on  12/27/2019    08/2017 - 02/16/2018 Chemotherapy   Faslodex with Herceptin and Perjeta along with Zometa every 4 weeks   03/02/2018 - 05/25/2018 Chemotherapy   Kadcyla   05/11/2018 Imaging   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 Associated vasogenic edema within the underlying right cerebral hemisphere without significant midline shift. Signal abnormality throughout the visualized bone marrow, compatible with osseous metastatic disease.    06/04/2018 Imaging   CT CAP: Right lower lobe lung nodule 7 mm (was 5 mm); multiple bone metastases throughout the spine and ribs sternum scapula and humerus, slightly increased lower thoracic mets, right renal lesion 2.5 cm (was 1.2 cm) right femur met increased from 2.1 cm to 2.9 cm   06/15/2018 -  Anti-estrogen oral therapy   Abemaciclib, Herceptin, letrozole, Xgeva   06/07/2019 Imaging   Progression of brain metastases,2 discrete dural-based lesions involving the posterior right frontal lobe. The more anterior lesion now measures 16.5 x 17.5 x 14 mm. The posterior lesion now measures 9.5 x 13 x 20 mm.  Vasogenic edema of the frontal and parietal lobes   06/21/2019 - 06/27/2019 Radiation Therapy   SRS to the brain   07/19/2020 Relapse/Recurrence   Left mastectomy scar nodule: Biopsy invasive carcinoma ER 60%, PR 0%, HER-2 negative by [facility]   07/29/2020 Surgery   Soft tissue mass left chest wall excision: No evidence of malignancy.   11/27/2020 - 02/05/2021 Chemotherapy      Patient is on Antibody Plan: BREAST ADO-TRASTUZUMAB EMTANSINE [facility]) Q21D     01/15/2021 - 08/27/2021 Chemotherapy   Patient is on Treatment Plan : BREAST ADO-Trastuzumab Emtansine (Kadcyla) q21d     10/06/2021 -  Chemotherapy   Patient is on Treatment Plan : BRAIN GBM Bevacizumab 14d x 6 cycles     10/06/2021 -  Chemotherapy   Patient is on Treatment Plan : BREAST METASTATIC fam-trastuzumab deruxtecan-nxki (Enhertu) q21d     Metastatic breast cancer  ([place])  06/29/2017 Initial Diagnosis   Metastatic breast cancer ([place])   06/15/2018 - 12/25/2020 Chemotherapy    Patient is on Treatment Plan: BRAIN GBM BEVACIZUMAB 14D X 6 CYCLES       01/15/2021 - 08/27/2021 Chemotherapy   Patient is on Treatment Plan : BREAST ADO-Trastuzumab Emtansine (Kadcyla) q21d     10/06/2021 -  Chemotherapy   Patient is on Treatment Plan : BRAIN GBM Bevacizumab 14d x 6 cycles     Brain metastasis ([place])  07/13/2018 Initial Diagnosis   Brain metastasis ([place])   11/27/2020 - 02/05/2021 Chemotherapy      Patient is on Antibody Plan: BREAST ADO-TRASTUZUMAB EMTANSINE ([place]) Q21D     10/06/2021 -  Chemotherapy   Patient is on Treatment Plan : BREAST METASTATIC fam-trastuzumab deruxtecan-nxki (Enhertu) q21d       Interval History:  Ronika Kelson Wind presents today after recent hospitalization for breakthrough seizure.  She and her husband describe lack of return to baseline over the past week.  She remains more densely weak on the left side, prior seizure she improved to normal within a couple of days.  Remains confined to a wheelchair, baseline is walks on her own with some left sided weakness. There may be ongoing "little twitching" of her left hand, but not confident of this.  Also remains more tired, confused, had visual hallucination earlier this week prior to bed.  Also complains of worsening swelling of lower legs, skin breakdown, insomnia, hip weakness. Fortunately no recurrence of events since hospitalization, decadron is at 34m daily, Keppra had been increased to 20027mBID.  Planning Enhertu and Avastin on 10/26/21 if able.  Dexamethasone  09/03/20: 44m69m1/30/21: 1mg68m/12/22: 18mg17m14/22: 4mg  81m09/22: 4mg BI76m2/12/22: 4mg dai15m Medications: Current Outpatient Medications on File Prior to Visit  Medication Sig Dispense Refill   acetaminophen (TYLENOL) 500 MG tablet Take 1,000 mg by mouth every 6 (six) hours as needed for moderate pain.      Ascorbic Acid (VITAMIN C PO) Take by mouth.     cholecalciferol (VITAMIN D) 1000 units tablet Take 1 tablet (1,000 Units total) by mouth daily.     dexamethasone (DECADRON) 4 MG tablet Take 1 tablet (4 mg total) by mouth daily. Start the day after chemotherapy for 2 days. 20 tablet 0   furosemide (LASIX) 20 MG tablet TAKE ONE (1) TABLET BY MOUTH EVERY DAY 30 tablet 1   levETIRAcetam (KEPPRA) 1000 MG tablet Take 2 tablets (2,000 mg total) by mouth 2 (two) times daily.     levothyroxine (SYNTHROID) 100 MCG tablet TAKE ONE TABLET EACH MORNING BEFORE BREAKFAST 90 tablet 3   LORazepam (ATIVAN) 1 MG tablet Take 1 tablet (1 mg total) by mouth 3 (three) times daily as needed for up to 8 doses for seizure (Take 1 tablet by mouth if you have recurrent episodes of left hand shaking.). 8 tablet 0   metoprolol succinate (TOPROL-XL) 100 MG 24 hr tablet Take with or immediately following a meal. (Patient taking differently: Take 100 mg by mouth daily. Take with or immediately following a meal.) 90 tablet 3   Multiple Vitamins-Minerals (MULTIVITAMIN WITH MINERALS) tablet Take 1 tablet by mouth daily.     ondansetron (ZOFRAN)  8 MG tablet Take 1 tablet (8 mg total) by mouth 2 (two) times daily as needed for refractory nausea / vomiting. Start on day 3 after chemo. 30 tablet 1   pantoprazole (PROTONIX) 40 MG tablet Take 1 tablet (40 mg total) by mouth daily. 30 tablet 1   prochlorperazine (COMPAZINE) 10 MG tablet Take 1 tablet (10 mg total) by mouth every 6 (six) hours as needed (Nausea or vomiting). 30 tablet 1   traZODone (DESYREL) 50 MG tablet Take 1 tablet (50 mg total) by mouth at bedtime. 30 tablet 3   No current facility-administered medications on file prior to visit.    Allergies:  Allergies  Allergen Reactions   Cantaloupe Extract Allergy Skin Test Shortness Of Breath   Contrast Media [Iodinated Contrast Media] Shortness Of Breath and Rash   Pravastatin Other (See Comments)    Legs hurt   Zosyn  [Piperacillin Sod-Tazobactam So] Rash and Other (See Comments)    Temperature increase, facial flushing   Latex Rash    Redness, itch    Past Medical History:  Past Medical History:  Diagnosis Date   Allergy    Breast cancer ([place]) 08/25/2011   L , invasive ductal carcinoma, ER/PR +,HER2 -   Cancer ([place])    left breast cancer   Coronary artery disease 2001   Heart attack [facility]) 09/2000   Sep 25, 2000  --no intervention   History of chemotherapy comp. 08/22/2012   4 cycles of FEC and $ cycles of Taxotere   Hyperlipidemia    Hypertension    Hypothyroidism    PONV (postoperative nausea and vomiting)    gets sick from anesthesia   Status post radiation therapy 07/09/12 - 08/22/2012   Left Breast, 60.4 gray   Past Surgical History:  Past Surgical History:  Procedure Laterality Date   ABDOMINAL HYSTERECTOMY  1998   TAH, oophorectomy   APPENDECTOMY  1970   BREAST CYST EXCISION Left 07/29/2020   Procedure: WIDE EXCISION OF LEFT MASTECTOMY INCISION;  Surgeon: Donnie Mesa, MD;  Location: [facility];  Service: General;  Laterality: Left;   [place]   removal of benign lump in rt breast   BREAST SURGERY  11/14/11   right simple mastectomy, left mrm   INCISION AND DRAINAGE OF WOUND Left 07/01/2017   Procedure: IRRIGATION AND DEBRIDEMENT CHEST WALL ABSCESS;  Surgeon: Donnie Mesa, MD;  Location: [facility];  Service: General;  Laterality: Left;   MASS EXCISION Left 06/22/2017   Procedure: EXCISION OF CHEST WALL MASSES;  Surgeon: Donnie Mesa, MD;  Location: [facility];  Service: General;  Laterality: Left;   MASTECTOMY Bilateral    for left breast cancer   OVARIAN CYST SURGERY Right [place]  08/30/2012   Procedure: REMOVAL PORT-A-CATH;  Surgeon: Imogene Burn. [name], MD;  Location: [facility];  Service: General;  Laterality: Right;  port removal   PORTACATH PLACEMENT  11/14/2011   Procedure: INSERTION PORT-A-CATH;  Surgeon: Imogene Burn.  [name], MD;  Location: [facility];  Service: General;  Laterality: Right;   PORTACATH PLACEMENT N/A 04/25/2016   Procedure: INSERTION PORT-A-CATH LEFT CHEST;  Surgeon: Donnie Mesa, MD;  Location: [facility];  Service: General;  Laterality: N/A;   skin tags  05/09/1997   left axillary left neck skin tags   TONSILLECTOMY  1968   Social History:  Social History   Socioeconomic History   Marital status: Married    Spouse name: Not  on file   Number of children: 3   Years of education: Not on file   Highest education level: High school graduate  Occupational History   Occupation: Retired  Tobacco Use   Smoking status: Never   Smokeless tobacco: Never  Vaping Use   Vaping Use: Never used  Substance and Sexual Activity   Alcohol use: No   Drug use: No   Sexual activity: Yes    Birth control/protection: Surgical    Comment: menarche 88, Parity age 74, G66, P3, 1 miscarriage,  HRT x 5-10 yrs, Mild Hot Flashes  Other Topics Concern   Not on file  Social History Narrative   Lives at home.   Social Determinants of Health   Financial Resource Strain: Not on file  Food Insecurity: Not on file  Transportation Needs: Not on file  Physical Activity: Not on file  Stress: Not on file  Social Connections: Not on file  Intimate Partner Violence: Not on file   Family History:  Family History  Problem Relation Age of Onset   Hypertension Maternal Grandmother    Diabetes Maternal Grandmother    Cancer Father 49       lung cancer and Prostate Cancer   Hypertension Mother    Cancer Paternal Aunt        ovarian   Cancer Cousin        breast, paternal cousin   Cancer Paternal Uncle        stomach   Cancer Paternal Grandfather        Esophagus   Colon cancer Neg Hx     Review of Systems: Constitutional: Denies fevers, chills or abnormal weight loss Eyes: Denies blurriness of vision Ears, nose, mouth, throat, and face: Denies mucositis or sore throat Respiratory: Denies  cough, dyspnea or wheezes Cardiovascular: Denies palpitation, chest discomfort or lower extremity swelling Gastrointestinal:  Denies nausea, constipation, diarrhea GU: Denies dysuria or incontinence Skin: Denies abnormal skin rashes Neurological: Per HPI Musculoskeletal: Denies joint pain, back or neck discomfort. No decrease in ROM Behavioral/Psych: Denies anxiety, disturbance in thought content, and mood instability   Physical Exam: Wt Readings from Last 3 Encounters:  10/04/21 145 lb 4.5 oz (65.9 kg)  09/27/21 141 lb 3.2 oz (64 kg)  09/23/21 132 lb 4.4 oz (60 kg)   Temp Readings from Last 3 Encounters:  10/14/21 (!) 97.5 F (36.4 C) (Tympanic)  10/05/21 98.4 F (36.9 C) (Oral)  09/27/21 (!) 97.2 F (36.2 C)   BP Readings from Last 3 Encounters:  10/14/21 110/60  10/05/21 115/67  09/27/21 133/67   Pulse Readings from Last 3 Encounters:  10/14/21 73  10/05/21 70  09/27/21 77    KPS: 60. General: Alert, cooperative, pleasant, in no acute distress Head: cushingoid EENT: No conjunctival injection or scleral icterus. Oral mucosa moist Lungs: Resp effort normal Cardiac: Regular rate and rhythm Abdomen: Soft, non-distended abdomen Skin: No rashes cyanosis or petechiae. Extremities: ++dependent edema, R lower leg wound, wrapped  Neurologic Exam: Mental Status: Awake, alert, attentive to examiner. Oriented to self and environment. Language is fluent with intact comprehension.  Cranial Nerves: Visual acuity is grossly normal. Visual fields are full. Extra-ocular movements intact. No ptosis. Face is symmetric, tongue midline. Motor: Tone and bulk are normal.  Left arm and leg 3/5 with additional bilateral hip girdle weakness. Reflexes are symmetric, no pathologic reflexes present. Intact finger to nose bilaterally Sensory: Normal Gait: Non ambulatory   Labs: I have reviewed the data as listed  Component Value Date/Time   NA 135 10/05/2021 0503   NA 140 08/12/2021  0918   NA 140 09/22/2017 0958   K 3.6 10/05/2021 0503   K 3.7 09/22/2017 0958   CL 103 10/05/2021 0503   CL 105 08/10/2012 0908   CO2 27 10/05/2021 0503   CO2 26 09/22/2017 0958   GLUCOSE 81 10/05/2021 0503   GLUCOSE 80 09/22/2017 0958   GLUCOSE 81 08/10/2012 0908   BUN 21 10/05/2021 0503   BUN 12 08/12/2021 0918   BUN 12.8 09/22/2017 0958   CREATININE 0.62 10/05/2021 0503   CREATININE 0.67 09/27/2021 1001   CREATININE 0.6 09/22/2017 0958   CALCIUM 8.2 (L) 10/05/2021 0503   CALCIUM 9.1 09/22/2017 0958   PROT 5.3 (L) 10/05/2021 0503   PROT 6.5 08/12/2021 0918   PROT 7.0 09/22/2017 0958   ALBUMIN 2.5 (L) 10/05/2021 0503   ALBUMIN 3.8 08/12/2021 0918   ALBUMIN 4.0 09/22/2017 0958   AST 101 (H) 10/05/2021 0503   AST 99 (H) 09/27/2021 1001   AST 37 (H) 09/22/2017 0958   ALT 78 (H) 10/05/2021 0503   ALT 72 (H) 09/27/2021 1001   ALT 31 09/22/2017 0958   ALKPHOS 299 (H) 10/05/2021 0503   ALKPHOS 140 09/22/2017 0958   BILITOT 0.9 10/05/2021 0503   BILITOT 1.0 09/27/2021 1001   BILITOT 0.41 09/22/2017 0958   GFRNONAA >60 10/05/2021 0503   GFRNONAA >60 09/27/2021 1001   GFRAA 76 09/02/2020 0806   GFRAA >60 07/10/2020 0820   Lab Results  Component Value Date   WBC 3.7 (L) 10/05/2021   NEUTROABS 2.2 10/04/2021   HGB 10.4 (L) 10/05/2021   HCT 31.0 (L) 10/05/2021   MCV 110.3 (H) 10/05/2021   PLT 65 (L) 10/05/2021     Imaging:  [place] Clinician Interpretation: I have personally reviewed the CNS images as listed.  My interpretation, in the context of the patient's clinical presentation, is treatment effect vs true progression  DG Chest 1 View  Result Date: 09/24/2021 CLINICAL DATA:  Recent seizure-like activity, initial encounter EXAM: CHEST  1 VIEW COMPARISON:  09/15/2021 FINDINGS: Left chest wall port is again noted and stable. Cardiac shadow is within normal limits. The lungs are well aerated bilaterally. No focal infiltrate is seen. Sclerotic changes are noted in the bony  structures consistent with metastatic disease. IMPRESSION: Diffuse sclerotic metastatic disease. No intrathoracic abnormality is noted. Electronically Signed   By: Inez Catalina M.D.   On: 09/24/2021 00:35   CT Head Wo Contrast  Result Date: 10/04/2021 CLINICAL DATA:  Mental status changes.  Seizure. EXAM: CT HEAD WITHOUT CONTRAST TECHNIQUE: Contiguous axial images were obtained from the base of the skull through the vertex without intravenous contrast. COMPARISON:  MRI 09/24/2021.  CT 09/24/2021 FINDINGS: Brain: Vasogenic edema again noted in the right frontal parietal region, similar to prior and consistent with the known metastatic disease. No evidence for hydrocephalus or midline shift. No abnormal extra-axial fluid collection. Vascular: No hyperdense vessel or unexpected calcification. Skull: Numerous sclerotic bone lesions identified in the skull in C1 vertebral body including a dominant confluent lesion right frontal parietal region up into the vertex. Sinuses/Orbits: The visualized paranasal sinuses and mastoid air cells are clear. Visualized portions of the globes and intraorbital fat are unremarkable. Other: None. IMPRESSION: 1. No substantial change in appearance of vasogenic edema in the right frontal parietal region consistent with known metastatic disease. No evidence for acute intracranial hemorrhage or midline shift. 2. Sclerotic bone lesions in the  skull base compatible with known metastatic disease. Electronically Signed   By: Misty Stanley M.D.   On: 10/04/2021 07:21   CT HEAD WO CONTRAST (5MM)  Result Date: 09/24/2021 CLINICAL DATA:  History of breast carcinoma with known metastatic disease. EXAM: CT HEAD WITHOUT CONTRAST TECHNIQUE: Contiguous axial images were obtained from the base of the skull through the vertex without intravenous contrast. COMPARISON:  08/28/2021 FINDINGS: Brain: Persistent vasogenic edema is noted within the right frontal and parietal lobes similar to that seen on  the prior exam. This is again consistent with the known history of metastatic disease. No findings to suggest acute hemorrhage or acute infarction are noted. Vascular: No hyperdense vessel or unexpected calcification. Skull: Diffuse bony metastatic disease is noted consistent with the given clinical history. Sinuses/Orbits: No acute finding. Other: None. IMPRESSION: Stable vasogenic edema throughout the right frontal and parietal lobe consistent with the known history of metastatic disease. These changes were better visualized on recent MRI examination. No new focal abnormality is noted. Findings consistent with diffuse bony metastatic disease. Electronically Signed   By: Inez Catalina M.D.   On: 09/24/2021 00:41   MR BRAIN WO CONTRAST  Result Date: 09/24/2021 CLINICAL DATA:  Acute neuro deficit. Suspect stroke. Seizure. History of metastatic breast cancer. EXAM: MRI HEAD WITHOUT CONTRAST TECHNIQUE: Multiplanar, multiecho pulse sequences of the brain and surrounding structures were obtained without intravenous contrast. COMPARISON:  CT head 09/24/2021.  MRI head 09/03/2021 FINDINGS: Brain: Progressive vasogenic edema in the right frontal parietal lobe over the convexity. Associated mass lesions in the right frontal parietal lobe again noted. These showed enhancement on the prior study. Contrast not administered today. These lesions show susceptibility, similar to the prior study. There is also dural thickening on the right due to tumor invasion likely from the calvarium. Small lesion in the right anterior temporal lobe best seen on axial FLAIR image 14. This enhanced on the prior study and is unchanged. Ventricle size normal.  No midline shift.  No acute infarct. Vascular: Normal arterial flow voids Skull and upper cervical spine: Extensive metastatic disease to the calvarium primarily on the right side. Diffuse metastatic disease in the cervical spine. Sinuses/Orbits: Mucosal edema paranasal sinuses.  Negative  orbit Other: None IMPRESSION: 1. Progression of vasogenic edema in the right frontal parietal lobe. Several associated lesions are present in the right frontal parietal lobe which enhanced on the prior study. These show susceptibility. There is diffuse metastatic disease to the right side of the skull with dural invasion 2. Small lesion right anterior temporal lobe, stable 3. 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 Electronically Signed   By: Franchot Gallo M.D.   On: 09/24/2021 08:24   CT CHEST ABDOMEN PELVIS W CONTRAST  Result Date: 09/15/2021 CLINICAL DATA:  Metastatic breast cancer.  Restaging. EXAM: CT CHEST, ABDOMEN, AND PELVIS WITH CONTRAST TECHNIQUE: Multidetector CT imaging of the chest, abdomen and pelvis was performed following the standard protocol during bolus administration of intravenous contrast. CONTRAST:  36m OMNIPAQUE IOHEXOL 350 MG/ML SOLN COMPARISON:  Chest abdomen pelvis CT 04/02/2021 FINDINGS: CT CHEST FINDINGS Cardiovascular: The heart size is normal. No substantial pericardial effusion. No thoracic aortic aneurysm. Left Port-A-Cath tip is positioned in the mid right atrium. Mediastinum/Nodes: No mediastinal lymphadenopathy. There is no hilar lymphadenopathy. Tiny hiatal hernia. The esophagus has normal imaging features. There is no axillary lymphadenopathy. Lungs/Pleura: No suspicious pulmonary nodule or mass. Stable subpleural scarring left lung apex. Subpleural reticulation anterior left upper lobe suggest  prior radiation treatment. No focal airspace consolidation. There is no evidence of pleural effusion. Musculoskeletal: Sclerotic bone metastases again noted, similar to prior. 15 mm sternal lesion on 50/4 was 15 mm previously (remeasured). CT ABDOMEN PELVIS FINDINGS Hepatobiliary: Liver metastases better demonstrated on today's postcontrast study. 3.4 cm lesion in the lateral right liver on 64/2 was measured previously at 2.1 cm. 2.1 cm right hepatic  lesion on 60/2 was measured previously at 1.3 cm. 1.6 cm lesion medial right liver on 67/2 was very subtle on the prior study measuring about 1.1 cm (remeasured). This lesion was not visible on the postcontrast study from 11/26/2020. Small ill-defined hypoenhancing lesion in the left liver measuring 14 mm on 59/2 was not visible on the prior noncontrast study or a postcontrast exam from 11/26/2020. Tiny calcified gallstone evident. No intrahepatic or extrahepatic biliary dilation. Pancreas: No focal mass lesion. No dilatation of the main duct. No intraparenchymal cyst. No peripancreatic edema. Spleen: No splenomegaly. No focal mass lesion. Adrenals/Urinary Tract: No adrenal nodule or mass. Cortical scarring noted both kidneys with several left renal cysts again noted. Right ureter unremarkable. Mild fullness of the left ureter is nonspecific. Bladder is moderately distended. Stomach/Bowel: Stomach is decompressed. Duodenum is normally positioned as is the ligament of Treitz. No small bowel wall thickening. No small bowel dilatation. The terminal ileum is normal. The appendix is not well visualized, but there is no edema or inflammation in the region of the cecum. No gross colonic mass. No colonic wall thickening. Large stool volume evident. Vascular/Lymphatic: No abdominal aortic aneurysm. No abdominal lymphadenopathy No pelvic sidewall lymphadenopathy. Reproductive:  There is no adnexal mass. Other: No intraperitoneal free fluid. Musculoskeletal: Stable widespread sclerotic bone metastases again noted. 2.9 cm sclerotic S1 lesion was 3.0 cm previously (remeasured). IMPRESSION: 1. Interval progression of hepatic metastases better demonstrated on today's postcontrast study than on the most recent comparison study. Several lesions on today's study were not visible on the previous noncontrast exam nor on the postcontrast study from 11/26/2020 suggesting new disease. 2. Similar appearance of widespread sclerotic bone  metastases. 3. Cholelithiasis. 4. Large stool volume. Imaging features could be compatible with constipation in the appropriate clinical setting. 5. Mild fullness of the left ureter is nonspecific given the bladder distension. Attention on follow-up recommended. 6. Aortic Atherosclerosis (ICD10-I70.0). Electronically Signed   By: Misty Stanley M.D.   On: 09/15/2021 95:32   NM PET Metabolic Brain  Result Date: 09/15/2021 CLINICAL DATA:  Dementia. Frontotemporal dementia versus Alzheimer's type dementia EXAM: NM PET METABOLIC BRAIN TECHNIQUE: 9.7 mCi F-18 FDG was injected intravenously. Full-ring PET imaging was performed from the vertex to skull base. CT data was obtained and used for attenuation correction and anatomic localization. PET data set fused with postcontrast T1 weighted MRI data set from 09/03/2021. FASTING BLOOD GLUCOSE:  Value: 112 mg/dl COMPARISON:  Brain MRI 09/03/2021, 06/02/2021 FINDINGS: Two regions of post-contrast T1 weighted MRI enhancement in the high RIGHT frontal lobe have metabolic activity similar to adjacent cortical gyri. No focal activity greater than background gyral activity to clearly suggest brain tumor recurrence. Along the medial margin of the anterior lesion, there is clear hypometabolic activity suggesting tumor necrosis. IMPRESSION: 1. No convincing evidence brain tumor recurrence with metabolic activity of the enhancing high RIGHT frontal lesions similar to adjacent cortical gyri. 2. The more anterior expanding RIGHT frontal lesion does have hypometabolism along the medial border suggesting tumor necrosis. Electronically Signed   By: Suzy Bouchard M.D.   On: 09/15/2021 09:20  DG Chest Port 1 View  Result Date: 10/04/2021 CLINICAL DATA:  Lethargy and altered mental status. EXAM: PORTABLE CHEST 1 VIEW COMPARISON:  Portable chest 09/23/2021. FINDINGS: Left IJ port catheter insertion with tip in the distal SVC is unchanged. The cardiac size is normal. There is  calcification of the transverse aorta. Pulmonary vasculature is normal. The lungs are clear of infiltrate. Widespread skeletal metastatic osteoblastic disease is redemonstrated. IMPRESSION: No evidence of acute chest disease. Widespread osseous sclerotic metastatic disease. Electronically Signed   By: Telford Nab M.D.   On: 10/04/2021 06:22   ECHOCARDIOGRAM COMPLETE  Result Date: 09/27/2021    ECHOCARDIOGRAM REPORT   Patient Name:   RHANDA LEMIRE Kadar Date of Exam: 09/27/2021 Medical Rec #:  [id number]      Height:       68.0 in Accession #:    [id number]     Weight:       132.3 lb Date of Birth:  02/17/1951      BSA:          1.714 m Patient Age:    72 years       BP:           138/74 mmHg Patient Gender: F              HR:           69 bpm. Exam Location:  Outpatient Procedure: 2D Echo, 3D Echo, Cardiac Doppler, Color Doppler and Strain Analysis Indications:    Chemo Z09  History:        Patient has prior history of Echocardiogram examinations. CAD                 and Previous Myocardial Infarction; Risk Factors:Hypertension                 and Dyslipidemia. Breast cancer. Seizure with left sided                 weakness.  Sonographer:    Darlina Sicilian RDCS Referring Phys: [id number] Nicholas Lose  Sonographer Comments: Echo perfored with the patient supine. IMPRESSIONS  1. Left ventricular ejection fraction, by estimation, is 60 to 65%. The left ventricle has normal function. The left ventricle has no regional wall motion abnormalities. Left ventricular diastolic parameters are consistent with Grade I diastolic dysfunction (impaired relaxation). The average left ventricular global longitudinal strain is -16.7 %.  2. Right ventricular systolic function is normal. The right ventricular size is normal. There is normal pulmonary artery systolic pressure.  3. Left atrial size was moderately dilated.  4. The mitral valve is normal in structure. No evidence of mitral valve regurgitation. No evidence of mitral stenosis.   5. The aortic valve is tricuspid. There is mild calcification of the aortic valve. Aortic valve regurgitation is mild. No aortic stenosis is present.  6. The inferior vena cava is normal in size with greater than 50% respiratory variability, suggesting right atrial pressure of 3 mmHg. FINDINGS  Left Ventricle: Left ventricular ejection fraction, by estimation, is 60 to 65%. The left ventricle has normal function. The left ventricle has no regional wall motion abnormalities. The average left ventricular global longitudinal strain is -16.7 %. The global longitudinal strain is normal despite suboptimal segment tracking. The left ventricular internal cavity size was normal in size. There is no left ventricular hypertrophy. Left ventricular diastolic parameters are consistent with Grade I diastolic dysfunction (impaired relaxation). Right Ventricle: The right ventricular size is normal. No increase in  right ventricular wall thickness. Right ventricular systolic function is normal. There is normal pulmonary artery systolic pressure. The tricuspid regurgitant velocity is 1.86 m/s, and  with an assumed right atrial pressure of 3 mmHg, the estimated right ventricular systolic pressure is 35.0 mmHg. Left Atrium: Left atrial size was moderately dilated. Right Atrium: Right atrial size was normal in size. Pericardium: There is no evidence of pericardial effusion. Mitral Valve: The mitral valve is normal in structure. Mild mitral annular calcification. No evidence of mitral valve regurgitation. No evidence of mitral valve stenosis. Tricuspid Valve: The tricuspid valve is normal in structure. Tricuspid valve regurgitation is trivial. No evidence of tricuspid stenosis. Aortic Valve: The aortic valve is tricuspid. There is mild calcification of the aortic valve. Aortic valve regurgitation is mild. Aortic regurgitation PHT measures 540 msec. No aortic stenosis is present. Pulmonic Valve: The pulmonic valve was normal in structure.  Pulmonic valve regurgitation is trivial. No evidence of pulmonic stenosis. Aorta: The aortic root is normal in size and structure. Venous: The inferior vena cava is normal in size with greater than 50% respiratory variability, suggesting right atrial pressure of 3 mmHg. IAS/Shunts: No atrial level shunt detected by color flow Doppler.  LEFT VENTRICLE PLAX 2D LVIDd:         4.00 cm   Diastology LVIDs:         2.70 cm   LV e' medial:    5.55 cm/s LV PW:         1.00 cm   LV E/e' medial:  11.4 LV IVS:        1.20 cm   LV e' lateral:   6.42 cm/s LVOT diam:     2.20 cm   LV E/e' lateral: 9.9 LV SV:         78 LV SV Index:   45        2D Longitudinal Strain LVOT Area:     3.80 cm  2D Strain GLS Avg:     -16.7 %  RIGHT VENTRICLE RV S prime:     10.30 cm/s TAPSE (M-mode): 1.2 cm LEFT ATRIUM             Index        RIGHT ATRIUM          Index LA diam:        3.20 cm 1.87 cm/m   RA Area:     6.79 cm LA Vol (A2C):   49.6 ml 28.93 ml/m  RA Volume:   10.20 ml 5.95 ml/m LA Vol (A4C):   50.2 ml 29.28 ml/m LA Biplane Vol: 54.0 ml 31.50 ml/m  AORTIC VALVE LVOT Vmax:   83.00 cm/s LVOT Vmean:  59.700 cm/s LVOT VTI:    0.205 m AI PHT:      540 msec  AORTA Ao Root diam: 3.50 cm Ao Asc diam:  2.90 cm MITRAL VALVE               TRICUSPID VALVE MV Area (PHT): 3.68 cm    TR Peak grad:   13.8 mmHg MV Decel Time: 206 msec    TR Vmax:        186.00 cm/s MV E velocity: 63.40 cm/s MV A velocity: 83.10 cm/s  SHUNTS MV E/A ratio:  0.76        Systemic VTI:  0.20 m  Systemic Diam: 2.20 cm Glori Bickers MD Electronically signed by Glori Bickers MD Signature Date/Time: 09/27/2021/9:46:49 AM    Final        Assessment/Plan 1. Brain metastasis [facility])  Ms. Gingras presents today with significant clinical and functional decline since seizure on 12/19.  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   She also demonstrates adverse effects from  steroids, dependent edema, skin breakdown, myopathy, hallucinations/hallucinosis.  Recommendations: -[facility] for likely inpatient transition, admission -Long term EEG needed for identification of occult or electrographic seizures -Contrast enhanced brain MRI -Titrate AEDs against EEG, for now will con't Keppra 2035m BID.  Next agent to add would be Vimpat. -Concerning adverse affects from corticosteroids, will keep decadron at 427mdaily for now.  Plan to instead treat suspected radio-inflammatory process with avastin, infusion scheduled for 10/26/21 along with Enhertu.   -Ok to up-titrate diureses for steroids associated dependent edema -Will need more intensive rehab, wound care  We appreciate the opportunity to participate in the care of CaKellis Montemurro.  We will gladly be available for the inpatient team during her admission with any questions, concerns, updates.  Patient and her husband are agreeable to transition to ED, medical oncology team notified and updated.    All questions were answered. The patient knows to call the clinic with any problems, questions or concerns. No barriers to learning were detected.  I have spent a total of 40 minutes of face-to-face and non-face-to-face time, excluding clinical staff time, preparing to see patient, ordering tests and/or medications, counseling the patient, and independently interpreting results and communicating results to the patient/family/caregiver    ZaVentura SellersMD Medical Director of Neuro-Oncology [facility] [place]2/29/22 10:38 AM

## 2021-10-14 NOTE — ED Notes (Addendum)
Pt has been gone for an extended amount of time. Pt exam in MRI still says, "exam begun" since 1440. Pt has been called multiple times since 1500. Pt NA x3 at 16:56. Pt will be taken out of system.

## 2021-10-14 NOTE — H&P (Signed)
History and Physical    [name] Riley SFK:[id number] DOB: 1950/10/25 DOA: 10/14/2021  PCP: [name], MD   Patient coming from: Home   Chief Complaint: Increased left-sided weakness, confusion, left thumb twitching    HPI: [name] Riley is a pleasant 70 y.o. female with medical history significant for hypertension, hyperlipidemia, hypothyroidism, breast cancer with metastases to brain, bone, and liver, and recent admission for seizures, now returning to the emergency department at the direction of her neuro oncologist for evaluation of increased left-sided weakness, possible left hand twitching, and concern for Todd's paralysis versus occult status epilepticus.  Patient was admitted to the hospital on 10/04/2021 with seizures with Todd's paralysis, discharged the following day with increased Keppra dosing, was continued on her current dose of Decadron, declined recommendation for SNF, and went home with her husband where she has had marked increased in her left-sided weakness since returning home, now unable to walk, and has had mild confusion, hallucinations, and also reports that her left thumb twitches multiple times a day but seems to improve if she rubs it.  She reports stable vision deficits and denies dysphagia or dysarthria. She also complains of increased leg swelling.  She denies chest pain, shortness of breath, fevers, or chills.  She saw her neuro oncologist today in the clinic and was directed to the emergency department for admission to the hospital, MRI brain, and long-term EEG.  ED Course: Upon arrival to the ED, patient is found to be afebrile, saturating well on room air, and with stable blood pressure.  Chemistry panel notable for elevated BUN to creatinine ratio.  CBC with platelets 92,000 and macrocytosis.  MRI brain without contrast demonstrates continued progression of extensive white matter vasogenic edema.  Neurology was consulted by the ED physician and hospitalists  asked to admit.  Review of Systems:  All other systems reviewed and apart from HPI, are negative.  Past Medical History:  Diagnosis Date   Allergy    Breast cancer ([place]) 08/25/2011   L , invasive ductal carcinoma, ER/PR +,HER2 -   Cancer ([place])    left breast cancer   Coronary artery disease 2001   Heart attack [facility]) 09/2000   Sep 25, 2000  --no intervention   History of chemotherapy comp. 08/22/2012   4 cycles of FEC and $ cycles of Taxotere   Hyperlipidemia    Hypertension    Hypothyroidism    PONV (postoperative nausea and vomiting)    gets sick from anesthesia   Seizures ([place])    Status post radiation therapy 07/09/12 - 08/22/2012   Left Breast, 60.4 gray    Past Surgical History:  Procedure Laterality Date   ABDOMINAL HYSTERECTOMY  1998   TAH, oophorectomy   APPENDECTOMY  1970   BREAST CYST EXCISION Left 07/29/2020   Procedure: WIDE EXCISION OF LEFT MASTECTOMY INCISION;  Surgeon: Donnie Mesa, MD;  Location: [place];  Service: General;  Laterality: Left;   [place]   removal of benign lump in rt breast   BREAST SURGERY  11/14/11   right simple mastectomy, left mrm   INCISION AND DRAINAGE OF WOUND Left 07/01/2017   Procedure: IRRIGATION AND DEBRIDEMENT CHEST WALL ABSCESS;  Surgeon: Donnie Mesa, MD;  Location: [facility];  Service: General;  Laterality: Left;   MASS EXCISION Left 06/22/2017   Procedure: EXCISION OF CHEST WALL MASSES;  Surgeon: Donnie Mesa, MD;  Location: [place];  Service: General;  Laterality: Left;   MASTECTOMY Bilateral  for left breast cancer   OVARIAN CYST SURGERY Right 1970   PORT-A-CATH REMOVAL  08/30/2012   Procedure: REMOVAL PORT-A-CATH;  Surgeon: Imogene Burn. [name], MD;  Location: [place];  Service: General;  Laterality: Right;  port removal   PORTACATH PLACEMENT  11/14/2011   Procedure: INSERTION PORT-A-CATH;  Surgeon: Imogene Burn. [name], MD;  Location: [place];  Service: General;  Laterality: Right;    PORTACATH PLACEMENT N/A 04/25/2016   Procedure: INSERTION PORT-A-CATH LEFT CHEST;  Surgeon: Donnie Mesa, MD;  Location: [place];  Service: General;  Laterality: N/A;   skin tags  05/09/1997   left axillary left neck skin tags   TONSILLECTOMY  1968    Social History:   reports that she has never smoked. She has never used smokeless tobacco. She reports that she does not drink alcohol and does not use drugs.  Allergies  Allergen Reactions   Cantaloupe Extract Allergy Skin Test Shortness Of Breath   Contrast Media [Iodinated Contrast Media] Shortness Of Breath and Rash   Pravastatin Other (See Comments)    Legs hurt   Zosyn [Piperacillin Sod-Tazobactam So] Rash and Other (See Comments)    Temperature increase, facial flushing   Latex Rash    Redness, itch     Family History  Problem Relation Age of Onset   Hypertension Maternal Grandmother    Diabetes Maternal Grandmother    Cancer Father 7       lung cancer and Prostate Cancer   Hypertension Mother    Cancer Paternal Aunt        ovarian   Cancer Cousin        breast, paternal cousin   Cancer Paternal Uncle        stomach   Cancer Paternal Grandfather        Esophagus   Colon cancer Neg Hx      Prior to Admission medications   Medication Sig Start Date End Date Taking? Authorizing Provider  acetaminophen (TYLENOL) 500 MG tablet Take 1,000 mg by mouth every 6 (six) hours as needed for moderate pain.    [provider]  Ascorbic Acid (VITAMIN C PO) Take by mouth.    [provider]  cholecalciferol (VITAMIN D) 1000 units tablet Take 1 tablet (1,000 Units total) by mouth daily. 04/13/18   Nicholas Lose, MD  dexamethasone (DECADRON) 4 MG tablet Take 1 tablet (4 mg total) by mouth daily. Start the day after chemotherapy for 2 days. 09/27/21   Nicholas Lose, MD  furosemide (LASIX) 20 MG tablet TAKE ONE (1) TABLET BY MOUTH EVERY DAY 02/08/21   Nicholas Lose, MD  levETIRAcetam (KEPPRA) 1000  MG tablet Take 2 tablets (2,000 mg total) by mouth 2 (two) times daily. 10/05/21   Orson Eva, MD  levothyroxine (SYNTHROID) 100 MCG tablet TAKE ONE TABLET EACH MORNING BEFORE BREAKFAST 09/13/21   [name], MD  LORazepam (ATIVAN) 1 MG tablet Take 1 tablet (1 mg total) by mouth 3 (three) times daily as needed for up to 8 doses for seizure (Take 1 tablet by mouth if you have recurrent episodes of left hand shaking.). 10/04/21   Luna Fuse, MD  metoprolol succinate (TOPROL-XL) 100 MG 24 hr tablet Take with or immediately following a meal. Patient taking differently: Take 100 mg by mouth daily. Take with or immediately following a meal. 08/12/21   [name], MD  Multiple Vitamins-Minerals (MULTIVITAMIN WITH MINERALS) tablet Take 1 tablet by mouth daily.    [provider]  ondansetron (ZOFRAN) 8 MG tablet Take 1 tablet (8 mg total) by mouth 2 (two) times daily as needed for refractory nausea / vomiting. Start on day 3 after chemo. 09/27/21   Nicholas Lose, MD  pantoprazole (PROTONIX) 40 MG tablet Take 1 tablet (40 mg total) by mouth daily. 09/25/21 09/25/22  Barton Dubois, MD  prochlorperazine (COMPAZINE) 10 MG tablet Take 1 tablet (10 mg total) by mouth every 6 (six) hours as needed (Nausea or vomiting). 09/27/21   Nicholas Lose, MD  traZODone (DESYREL) 50 MG tablet Take 1 tablet (50 mg total) by mouth at bedtime. 10/05/21   Orson Eva, MD    Physical Exam: Vitals:   10/14/21 1129 10/14/21 1857  BP: 117/67 (!) 158/78  Pulse: 74 78  Resp: 16 19  Temp: 98.8 F (37.1 C) 98 F (36.7 C)  TempSrc: Oral   SpO2: 98% 98%    Constitutional: NAD, calm  Eyes: PERTLA, lids and conjunctivae normal ENMT: Mucous membranes are moist. Posterior pharynx clear of any exudate or lesions.   Neck: supple, no masses  Respiratory: no wheezing, no crackles. No accessory muscle use.  Cardiovascular: S1 & S2 heard, regular rate and rhythm. Bilateral lower leg swelling. Abdomen: No  distension, no tenderness, soft. Bowel sounds active.  Musculoskeletal: no clubbing / cyanosis. No joint deformity upper and lower extremities.   Skin: Hyperpigmentation to lower LEs b/l, lower right leg wound, ecchymoses. Warm, dry, well-perfused. Neurologic: CN 2-12 grossly intact. Left arm and leg strength 3/5 throughout. Alert and oriented to person, place, and situation.  Psychiatric: Very pleasant. Cooperative.    Labs and Imaging on Admission: I have personally reviewed following labs and imaging studies  CBC: Recent Labs  Lab 10/14/21 1153  WBC 8.2  NEUTROABS 5.7  HGB 12.0  HCT 35.8*  MCV 107.5*  PLT 92*   Basic Metabolic Panel: Recent Labs  Lab 10/14/21 1153  NA 139  K 4.0  CL 102  CO2 28  GLUCOSE 90  BUN 27*  CREATININE 0.67  CALCIUM 9.5   GFR: Estimated Creatinine Clearance: 67.9 mL/min (by C-G formula based on SCr of 0.67 mg/dL). Liver Function Tests: No results for input(s): AST, ALT, ALKPHOS, BILITOT, PROT, ALBUMIN in the last 168 hours. No results for input(s): LIPASE, AMYLASE in the last 168 hours. No results for input(s): AMMONIA in the last 168 hours. Coagulation Profile: No results for input(s): INR, PROTIME in the last 168 hours. Cardiac Enzymes: No results for input(s): CKTOTAL, CKMB, CKMBINDEX, TROPONINI in the last 168 hours. BNP (last 3 results) No results for input(s): PROBNP in the last 8760 hours. HbA1C: No results for input(s): HGBA1C in the last 72 hours. CBG: No results for input(s): GLUCAP in the last 168 hours. Lipid Profile: No results for input(s): CHOL, HDL, LDLCALC, TRIG, CHOLHDL, LDLDIRECT in the last 72 hours. Thyroid Function Tests: No results for input(s): TSH, T4TOTAL, FREET4, T3FREE, THYROIDAB in the last 72 hours. Anemia Panel: No results for input(s): VITAMINB12, FOLATE, FERRITIN, TIBC, IRON, RETICCTPCT in the last 72 hours. Urine analysis:    Component Value Date/Time   COLORURINE YELLOW 10/04/2021 0100    APPEARANCEUR CLEAR 10/04/2021 0100   APPEARANCEUR Clear 04/26/2021 1128   LABSPEC 1.020 10/04/2021 0100   PHURINE 6.5 10/04/2021 0100   GLUCOSEU NEGATIVE 10/04/2021 0100   HGBUR NEGATIVE 10/04/2021 0100   BILIRUBINUR NEGATIVE 10/04/2021 0100   BILIRUBINUR Negative 04/26/2021 1128   KETONESUR NEGATIVE 10/04/2021 0100   PROTEINUR NEGATIVE 10/04/2021 0100  NITRITE NEGATIVE 10/04/2021 0100   LEUKOCYTESUR TRACE (A) 10/04/2021 0100   Sepsis Labs: @LABRCNTIP (procalcitonin:4,lacticidven:4) )No results found for this or any previous visit (from the past 240 hour(s)).   Radiological Exams on Admission: MR BRAIN WO CONTRAST  Result Date: 10/14/2021 CLINICAL DATA:  Seizures.  Metastatic breast cancer. EXAM: MRI HEAD WITHOUT CONTRAST TECHNIQUE: Multiplanar, multiecho pulse sequences of the brain and surrounding structures were obtained without intravenous contrast. COMPARISON:  CT head without contrast 10/04/2021. MR head without contrast 09/24/2021. MR head without and with contrast 09/03/2021 FINDINGS: Brain: Continued progression of extensive white matter vasogenic edema is present in the high right frontal lobe and portions of the parietal lobe. There is increasing mass effect with effacement of the sulci. Minimal effacement of the right lateral ventricle noted. No significant midline shift is present. Two areas of cortical susceptibility are again noted in the right frontal and parietal lobe corresponding with the areas of enhancement. These areas are stable. No new foci of susceptibility are present. Focal T2 signal at the right temporal tip is stable. No significant white matter changes are present in the left hemisphere or cerebellum. Brainstem is unremarkable. T2 shine through in susceptibility is present without definite restricted diffusion. Basal ganglia are within normal limits. The internal auditory canals are unremarkable. No new discrete lesions are present. Vascular: Flow is present in the  major intracranial arteries. Skull and upper cervical spine: Extensive osseous metastases are again noted. Sinuses/Orbits: The paranasal sinuses and mastoid air cells are clear. The globes and orbits are within normal limits. IMPRESSION: 1. Continued progression of extensive white matter vasogenic edema in the high right frontal lobe and portions of the right parietal lobe. PET scan 09/15/2021 demonstrated no increased activity relative to the adjacent areas in suggest tumor necrosis. Progressive white matter changes may reflect reactive disease rather than metastatic progression. The diffuse white matter progression is likely associated with the seizure activity. 2. Stable areas of cortical susceptibility in the right frontal and parietal lobe corresponding with the areas of enhancement. These represent areas of treated metastases demonstrating calcification on the CT scan. 3. Stable focal T2 signal at the right temporal tip. 4. No new foci of susceptibility to suggest metastatic disease. 5. Extensive osseous metastases. Electronically Signed   By: San Morelle M.D.   On: 10/14/2021 18:47    Assessment/Plan  1. Increased left-sided weakness and confusion; seizures  - Presents from neuro onc clinic with increased left sided weakness, confusion, and left thumb twitching concerning for occult seizures  - MRI brain without contrast in ED demonstrates progression of vasogenic edema  - Pt and husband hesitant to increase Decadron d/t confusion and leg swelling  - Continue Keppra 2000 mg BID, Decadron 4 mg qD, follow-up neuro recommendations    2. Metastatic breast cancer  - S/p b/l mastectomy, chemo, and radiation in 2013, repapsed in 2017, has known bone, liver, and brain mets s/p SRS in 2020, now on Enhertu    3. Hypertension  - BP at goal, continue metoprolol    4. CAD  - She reports hx of MI at age 88  - No anginal complaints  - Statin has been on hold d/t elevated LFTs, continue metoprolol     5. Leg swelling  - Pt complains of increased leg swelling over past couple weeks, no respiratory complaints or JVD  - She had preserved EF on TTE from 09/27/21  - Albumin was 2.5 recently and she has been on Decadron  - Elevate legs,  continue Lasix    6. Thrombocytopenia  - Platelets 92k on admission - Appears stable, no bleeding   7. Hypothyroidism  - Continue Synthroid    DVT prophylaxis: SCDs  Code Status: DNR, confirmed with patient and her husband on admission   Level of Care: Level of care: Telemetry Medical Family Communication: husband by phone   Disposition Plan:  Patient is from: Home  Anticipated d/c is to: TBD Anticipated d/c date is: 10/17/21 Patient currently: Pending neuro recommendations  Consults called: neurology   Admission status: Inpatient     Vianne Bulls, MD [facility]  10/14/2021, 8:49 PM

## 2021-10-14 NOTE — ED Provider Notes (Signed)
Emergency Medicine Provider Triage Evaluation Note  [name] Riley , a 70 y.o. female  was evaluated in triage.  Pt complains of seizures.  Patient states that she was sent to the emergency department for an EEG.  Patient had her third seizure 2 weeks ago, is currently taking Keppra.  She states that she has been feeling more nervous and confused.  She denies any other complaints.  States she has been having some left arm weakness since her last seizure.  He was seen at her neurologist today, who wanted her admitted for an EEG.  Review of Systems  Positive: Seizure, weakness, anxiety, left hand tremors, left leg swelling Negative: Fever, chills, dizziness  Physical Exam  BP 117/67 (BP Location: Right Arm)    Pulse 74    Temp 98.8 F (37.1 C) (Oral)    Resp 16    SpO2 98%  Gen:   Awake, no distress   Resp:  Normal effort  MSK:   Moves extremities without difficulty, weakness in left arm compared to right Other:    Medical Decision Making  Medically screening exam initiated at 11:41 AM.  Appropriate orders placed.  [name] was informed that the remainder of the evaluation will be completed by another provider, this initial triage assessment does not replace that evaluation, and the importance of remaining in the ED until their evaluation is complete.  Will require neurology consult and likely admission for EEG   [name] 10/14/21 1147    [name], MD 10/14/21 1422

## 2021-10-15 ENCOUNTER — Inpatient Hospital Stay (HOSPITAL_COMMUNITY): Payer: Medicare Other

## 2021-10-15 ENCOUNTER — Telehealth: Payer: Self-pay

## 2021-10-15 ENCOUNTER — Other Ambulatory Visit (HOSPITAL_COMMUNITY): Payer: Self-pay

## 2021-10-15 DIAGNOSIS — G40919 Epilepsy, unspecified, intractable, without status epilepticus: Secondary | ICD-10-CM

## 2021-10-15 DIAGNOSIS — G40901 Epilepsy, unspecified, not intractable, with status epilepticus: Principal | ICD-10-CM

## 2021-10-15 LAB — CBC
HCT: 34.5 % — ABNORMAL LOW (ref 36.0–46.0)
Hemoglobin: 11.3 g/dL — ABNORMAL LOW (ref 12.0–15.0)
MCH: 35.4 pg — ABNORMAL HIGH (ref 26.0–34.0)
MCHC: 32.8 g/dL (ref 30.0–36.0)
MCV: 108.2 fL — ABNORMAL HIGH (ref 80.0–100.0)
Platelets: 59 10*3/uL — ABNORMAL LOW (ref 150–400)
RBC: 3.19 MIL/uL — ABNORMAL LOW (ref 3.87–5.11)
RDW: 14.2 % (ref 11.5–15.5)
WBC: 7 10*3/uL (ref 4.0–10.5)
nRBC: 0.3 % — ABNORMAL HIGH (ref 0.0–0.2)

## 2021-10-15 LAB — COMPREHENSIVE METABOLIC PANEL
ALT: 87 U/L — ABNORMAL HIGH (ref 0–44)
AST: 119 U/L — ABNORMAL HIGH (ref 15–41)
Albumin: 2.7 g/dL — ABNORMAL LOW (ref 3.5–5.0)
Alkaline Phosphatase: 383 U/L — ABNORMAL HIGH (ref 38–126)
Anion gap: 6 (ref 5–15)
BUN: 28 mg/dL — ABNORMAL HIGH (ref 8–23)
CO2: 30 mmol/L (ref 22–32)
Calcium: 8.8 mg/dL — ABNORMAL LOW (ref 8.9–10.3)
Chloride: 100 mmol/L (ref 98–111)
Creatinine, Ser: 0.66 mg/dL (ref 0.44–1.00)
GFR, Estimated: >60 mL/min (ref >60)
Glucose, Bld: 82 mg/dL (ref 70–99)
Potassium: 4.6 mmol/L (ref 3.5–5.1)
Sodium: 136 mmol/L (ref 135–145)
Total Bilirubin: 1 mg/dL (ref 0.3–1.2)
Total Protein: 5.5 g/dL — ABNORMAL LOW (ref 6.5–8.1)

## 2021-10-15 MED ORDER — SODIUM CHLORIDE 0.9 % IV SOLN
50.0000 mg | Freq: Two times a day (BID) | INTRAVENOUS | Status: DC
  Filled 2021-10-15 (×4): qty 5

## 2021-10-15 MED ORDER — LEVETIRACETAM 500 MG/5ML IV SOLN
2000.0000 mg | Freq: Two times a day (BID) | INTRAVENOUS | Status: DC
Start: 2021-10-15 — End: 2021-10-18
  Filled 2021-10-15 (×7): qty 20

## 2021-10-15 MED ORDER — LORAZEPAM 2 MG/ML IJ SOLN
INTRAMUSCULAR | Status: AC
  Filled 2021-10-15: qty 1

## 2021-10-15 MED ORDER — CHLORHEXIDINE GLUCONATE CLOTH 2 % EX PADS
6.0000 | MEDICATED_PAD | Freq: Every day | CUTANEOUS | Status: DC
  Administered 2021-10-15 – 2021-10-20 (×6): 6 via TOPICAL

## 2021-10-15 MED ORDER — LORAZEPAM 2 MG/ML IJ SOLN
2.0000 mg | INTRAMUSCULAR | Status: DC | PRN
  Administered 2021-10-15: 08:00:00 2 mg via INTRAVENOUS

## 2021-10-15 MED ORDER — LACOSAMIDE 50 MG PO TABS
50.0000 mg | ORAL_TABLET | Freq: Two times a day (BID) | ORAL | Status: DC
  Administered 2021-10-15 – 2021-10-20 (×11): 50 mg via ORAL
  Filled 2021-10-15 (×11): qty 1

## 2021-10-15 MED ORDER — METOPROLOL TARTRATE 25 MG PO TABS
25.0000 mg | ORAL_TABLET | Freq: Two times a day (BID) | ORAL | Status: DC
  Administered 2021-10-15 – 2021-10-18 (×4): 25 mg via ORAL
  Filled 2021-10-15 (×7): qty 1

## 2021-10-15 MED ORDER — LEVETIRACETAM 500 MG PO TABS
2000.0000 mg | ORAL_TABLET | Freq: Two times a day (BID) | ORAL | Status: DC
  Administered 2021-10-15 – 2021-10-18 (×6): 2000 mg via ORAL
  Filled 2021-10-15: qty 2
  Filled 2021-10-15 (×6): qty 1

## 2021-10-15 NOTE — Progress Notes (Signed)
PROGRESS NOTE  [name] Riley  DOB: 04-22-1951  PCP: [name], MD PPJ:[id number]  DOA: 10/14/2021  LOS: 1 day  Hospital Day: 2  Chief Complaint: Increased left-sided weakness, confusion, left thumb twitching    Brief narrative: [name] Riley is a 70 y.o. 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 Patient presented to the ED on 12/29 at the direction of her neuro oncologist for evaluation of increased left-sided weakness, possible left hand twitching, and concern for Todd's paralysis versus occult status epilepticus.   Patient was recently hospitalized 12/19-12/20 for seizures with Todd's paralysis and was discharged on higher dose of Keppra and continued on Decadron.  She declined to go to SNF at that time and went home with her husband. At home, patient experienced progressive left-sided weakness to the point of inability to walk associated with increased leg swelling, confusion, hallucination.  She also had left arm twitching multiple times a day. On 12/29, she saw her neuro oncologist and was directed to the ED for further evaluation management   In the ED, patient was afebrile, hemodynamically stable, breathing on room air  Labs showed elevated LFTs MRI brain without contrast showed continued progression of extensive white matter vasogenic edema.   Admitted to hospitalist service  Neurology was consulted   Subjective: Patient was seen and examined this morning.  Elderly Caucasian female.  Lethargic.  Open eyes on verbal command.  Only able to state her name and date of birth.  Not restless or agitated. I see that she has distended abdomen as well as bilateral lower extremity edema. Chart reviewed. LFTs further up today  Assessment/Plan: Progressive neurological symptoms in the setting of brain metastasis  -Presented with increased left sided weakness, confusion,  and left thumb twitching concerning for occult seizures -Noted on exam to have left-sided deficits. -MRI brain showed continued progression of extensive white matter festinate edema in the high right frontal lobe and portions of the right parietal lobe. 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  Currently on Keppra at 2000 mg twice daily -Per neuro-oncology recommendation, to continue Decadron at 4 mg daily for now -Noted a plan from neuro-oncology to treat suspected radial inflammatory process with Avastin infusion scheduled for 10/26/2021 along with Enhertu.  Metastatic breast cancer  -left breast cancer (invasive ductal carcinoma, ER/PR +,HER2-) s/p mastectomy in 2013, history of chemotherapy and radiation therapy, local recurrence of breast cancer in 2017, breast cancer metastases to brain and several bones, s/p SRS in 2020, now on Enhertu    Elevated liver enzymes -unclear how much of work-up has been done for this as an outpatient.  High likelihood of association with malignancy.  -On clinical examination, noted that she has distended abdomen and bilateral lower extremity swelling.  Need to rule out portal hypotension and ascites. Recent Labs  Lab 10/15/21 0232  AST 119*  ALT 87*  ALKPHOS 383*  BILITOT 1.0  PROT 5.5*  ALBUMIN 2.7*   Essential hypertension -BP soft with metoprolol succinate 100 mg daily and Lasix 20 mg daily. -Since patient may need higher dose of diuresis due to pedal edema, I would cut back on metoprolol.  CAD  -she reports hx of MI at age 48  -No current anginal complaints  -Statin on hold due to  elevated LFTs. Not on antiplatelets. -Continue metoprolol.   Bilateral pedal edema -Pt complains of increased leg swelling over past couple weeks, no respiratory complaints  or JVD -probably related to Decadron. -She had preserved EF on TTE from 09/27/21  -Albumin was 2.5 recently and she has been on Decadron  -Elevate legs, continue Lasix 20 mg daily   Thrombocytopenia  -Platelets 92k on admission.  Down to 64 today.  Watch out for bleeding episodes.  Recent Labs  Lab 10/14/21 1153 10/15/21 0232  PLT 92* 59*   Hypothyroidism  -Continue Synthroid   Mobility: Needs PT eval once mental status improves Living condition: Home Goals of care:   Code Status: DNR.  Per H&P, this was confirmed by patient and husband at admission Nutritional status: Body mass index is 22.29 kg/m.      Diet:  Diet Order             Diet Heart Room service appropriate? Yes; Fluid consistency: Thin  Diet effective now                  DVT prophylaxis:  SCDs Start: 10/14/21 2046   Antimicrobials: None Fluid: None at this time Consultants: Neurology Family Communication: None at bedside  Status is: Inpatient  Continue in-hospital care because: Ongoing seizure work-up Level of care: Telemetry Medical   Dispo: The patient is from: Home              Anticipated d/c is to: Likely needs placement              Patient currently is not medically stable to d/c.   Difficult to place patient No     Infusions:   lacosamide (VIMPAT) IV     levETIRAcetam      Scheduled Meds:  Chlorhexidine Gluconate Cloth  6 each Topical Daily   dexamethasone  4 mg Oral Daily   furosemide  20 mg Oral Daily   lacosamide  50 mg Oral Q12H   levETIRAcetam  2,000 mg Oral BID   levothyroxine  100 mcg Oral Q0600   LORazepam       metoprolol tartrate  25 mg Oral BID   pantoprazole  40 mg Oral Daily   sodium chloride flush  3 mL Intravenous Q12H   traZODone  50 mg Oral QHS    PRN meds: acetaminophen **OR** acetaminophen, bisacodyl, LORazepam, ondansetron **OR** ondansetron (ZOFRAN) IV   Antimicrobials: Anti-infectives (From admission, onward)    None        Objective: Vitals:   10/15/21 1045 10/15/21 1134  BP:  109/61  Pulse:  74  Resp: 18 17  Temp:  97.7 F (36.5 C)  SpO2:  97%    Intake/Output Summary (Last 24 hours) at 10/15/2021 1346 Last data filed at 10/15/2021 0750 Gross per 24 hour  Intake --  Output 200 ml  Net -200 ml   Filed Weights   10/15/21 0022  Weight: 66.5 kg   Weight change:  Body mass index is 22.29 kg/m.   Physical Exam: General exam: Pleasant, elderly Caucasian female.  Not in physical distress Skin: No rashes, lesions or ulcers. HEENT: Atraumatic, normocephalic, no obvious bleeding Lungs: Clear to auscultation bilaterally CVS: Regular rate and rhythm, no murmur GI/Abd soft, nontender, distended, bowel sound present CNS: Wakes up on verbal command, able to state her name and date of birth.  Unable to carry out any other conversation Psychiatry: Sad affect Extremities: Pedal edema 1+ bilaterally with chronic cellulitis  Data Review:  I have personally reviewed the laboratory data and studies available.  F/u labs ordered Unresulted Labs (From admission, onward)     Start     Ordered   10/16/21 0500  Ammonia  Tomorrow morning,   R       Question:  Specimen collection method  Answer:  Lab=Lab collect   10/15/21 1346   10/15/21 0500  Comprehensive metabolic panel  Daily,   R      10/14/21 2049   10/15/21 0500  CBC  Daily,   R      10/14/21 2049   10/14/21 2000  Resp Panel by RT-PCR (Flu A&B, Covid) Nasopharyngeal Swab  (Tier 2 - Symptomatic/asymptomatic)  Once,   STAT        10/14/21 2000            Signed, Terrilee Croak, MD [facility] 10/15/2021

## 2021-10-15 NOTE — Progress Notes (Signed)
°  Transition of Care [facility]) Screening Note   Patient Details  Name: [name] Riley Date of Birth: 1951-01-01   Transition of Care [facility]) CM/SW Contact:    Pollie Friar, RN Phone Number: 10/15/2021, 1:57 PM    Transition of Care Department [facility]) has reviewed patient. We will continue to monitor patient advancement through interdisciplinary progression rounds. If new patient transition needs arise, please place a TOC consult.

## 2021-10-15 NOTE — Progress Notes (Addendum)
EEG complete - results pending 

## 2021-10-15 NOTE — Consult Note (Addendum)
NEURO HOSPITALIST CONSULT NOTE   Requestig physician: Dr. Pietro Cassis  Reason for Consult: Worsening seizures in the context of metastatic brain lesion.   History obtained from:   Patient and Chart     HPI:                                                                                                                                          [name] Riley is an 70 y.o. 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 Mickeal Skinner (Neurooncology), who re-presented to the ED on Thursday night after significant clinical and functional decline since a seizure on 12/19. Per Dr. 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 She also demonstrates adverse effects from steroids, dependent edema, skin breakdown, myopathy, hallucinations/hallucinosis.  Dr. Renda Rolls clinic noted from Thursday was reviewed: "[name] Riley presents today after recent hospitalization for breakthrough seizure.  She and her husband describe lack of return to baseline over the past week.  She remains more densely weak on the left side, prior seizure she improved to normal within a couple of days.  Remains confined to a wheelchair, baseline is walks on her own with some left sided weakness. There may be ongoing "little twitching" of her left hand, but not confident of this.  Also remains more tired, confused, had visual hallucination earlier this week prior to bed.  Also complains of worsening swelling of lower legs, skin breakdown, insomnia, hip weakness. Fortunately no recurrence of events since hospitalization, decadron is at 73m  daily, Keppra had been increased to 20062mBID.  Planning Enhertu and Avastin on 10/26/21 if able."   [facility] Va[facility], LTM EEG and contrast enhanced brain MRI. Per Dr. VaMickeal Skinnerthe next agent that should be added if LTM EEG shows recurrent seizures is Vimpat.   Home medications include dexamethasone, maximum-dose Keppra and PRN Ativan.  Dr. OpCriss RosalesPI was also reviewed, including the following: "Patient was admitted to the hospital on 10/04/2021 with seizures with Todd's paralysis, discharged the following day with increased Keppra dosing, was continued on her current dose of Decadron, declined recommendation for SNF, and went home with her husband where she has had marked increased in her left-sided weakness since returning home, now unable to walk, and has had mild confusion, hallucinations, and also reports that her left thumb twitches multiple times a day but seems to improve if she  rubs it.  She reports stable vision deficits and denies dysphagia or dysarthria. She also complains of increased leg swelling.  She denies chest pain, shortness of breath, fevers, or chills.  She saw her neuro oncologist today in the clinic and was directed to the emergency department for admission to the hospital, MRI brain, and long-term EEG."  Past Medical History:  Diagnosis Date   Allergy    Breast cancer ([place]) 08/25/2011   L , invasive ductal carcinoma, ER/PR +,HER2 -   Cancer ([place])    left breast cancer   Coronary artery disease 2001   Heart attack [facility]) 09/2000   Sep 25, 2000  --no intervention   History of chemotherapy comp. 08/22/2012   4 cycles of FEC and $ cycles of Taxotere   Hyperlipidemia    Hypertension    Hypothyroidism    PONV (postoperative nausea and vomiting)    gets sick from anesthesia   Seizures ([place])    Status post radiation therapy 07/09/12 - 08/22/2012   Left Breast, 60.4 gray    Past Surgical History:   Procedure Laterality Date   ABDOMINAL HYSTERECTOMY  1998   TAH, oophorectomy   APPENDECTOMY  1970   BREAST CYST EXCISION Left 07/29/2020   Procedure: WIDE EXCISION OF LEFT MASTECTOMY INCISION;  Surgeon: Donnie Mesa, MD;  Location: [place];  Service: General;  Laterality: Left;   [place]   removal of benign lump in rt breast   BREAST SURGERY  11/14/11   right simple mastectomy, left mrm   INCISION AND DRAINAGE OF WOUND Left 07/01/2017   Procedure: IRRIGATION AND DEBRIDEMENT CHEST WALL ABSCESS;  Surgeon: Donnie Mesa, MD;  Location: [facility];  Service: General;  Laterality: Left;   MASS EXCISION Left 06/22/2017   Procedure: EXCISION OF CHEST WALL MASSES;  Surgeon: Donnie Mesa, MD;  Location: [place];  Service: General;  Laterality: Left;   MASTECTOMY Bilateral    for left breast cancer   OVARIAN CYST SURGERY Right [place]  08/30/2012   Procedure: REMOVAL PORT-A-CATH;  Surgeon: Imogene Burn. [name], MD;  Location: [place];  Service: General;  Laterality: Right;  port removal   PORTACATH PLACEMENT  11/14/2011   Procedure: INSERTION PORT-A-CATH;  Surgeon: Imogene Burn. [name], MD;  Location: [place];  Service: General;  Laterality: Right;   PORTACATH PLACEMENT N/A 04/25/2016   Procedure: INSERTION PORT-A-CATH LEFT CHEST;  Surgeon: Donnie Mesa, MD;  Location: [place];  Service: General;  Laterality: N/A;   skin tags  05/09/1997   left axillary left neck skin tags   TONSILLECTOMY  1968    Family History  Problem Relation Age of Onset   Hypertension Maternal Grandmother    Diabetes Maternal Grandmother    Cancer Father 1       lung cancer and Prostate Cancer   Hypertension Mother    Cancer Paternal Aunt        ovarian   Cancer Cousin        breast, paternal cousin   Cancer Paternal Uncle        stomach   Cancer Paternal Grandfather        Esophagus   Colon cancer Neg Hx             Social History:   reports that she has never smoked. She has never used smokeless tobacco. She reports that she does not drink alcohol and does not use drugs.  Allergies  Allergen Reactions   Cantaloupe Extract Allergy Skin Test Shortness Of Breath   Contrast Media [Iodinated Contrast Media] Shortness Of Breath and Rash   Pravastatin Other (See Comments)    Legs hurt   Zosyn [Piperacillin Sod-Tazobactam So] Rash and Other (See Comments)    Temperature increase, facial flushing   Latex Rash    Redness, itch     MEDICATIONS:                                                                                                                     Prior to Admission:  Medications Prior to Admission  Medication Sig Dispense Refill Last Dose   acetaminophen (TYLENOL) 500 MG tablet Take 1,000 mg by mouth every 6 (six) hours as needed for moderate pain.      Ascorbic Acid (VITAMIN C PO) Take by mouth.      cholecalciferol (VITAMIN D) 1000 units tablet Take 1 tablet (1,000 Units total) by mouth daily.      dexamethasone (DECADRON) 4 MG tablet Take 1 tablet (4 mg total) by mouth daily. Start the day after chemotherapy for 2 days. 20 tablet 0    furosemide (LASIX) 20 MG tablet TAKE ONE (1) TABLET BY MOUTH EVERY DAY 30 tablet 1    levETIRAcetam (KEPPRA) 1000 MG tablet Take 2 tablets (2,000 mg total) by mouth 2 (two) times daily.      levothyroxine (SYNTHROID) 100 MCG tablet TAKE ONE TABLET EACH MORNING BEFORE BREAKFAST 90 tablet 3    LORazepam (ATIVAN) 1 MG tablet Take 1 tablet (1 mg total) by mouth 3 (three) times daily as needed for up to 8 doses for seizure (Take 1 tablet by mouth if you have recurrent episodes of left hand shaking.). 8 tablet 0    metoprolol succinate (TOPROL-XL) 100 MG 24 hr tablet Take with or immediately following a meal. (Patient taking differently: Take 100 mg by mouth daily. Take with or immediately following a meal.) 90 tablet 3    Multiple Vitamins-Minerals (MULTIVITAMIN WITH MINERALS) tablet  Take 1 tablet by mouth daily.      ondansetron (ZOFRAN) 8 MG tablet Take 1 tablet (8 mg total) by mouth 2 (two) times daily as needed for refractory nausea / vomiting. Start on day 3 after chemo. 30 tablet 1    pantoprazole (PROTONIX) 40 MG tablet Take 1 tablet (40 mg total) by mouth daily. 30 tablet 1    prochlorperazine (COMPAZINE) 10 MG tablet Take 1 tablet (10 mg total) by mouth every 6 (six) hours as needed (Nausea or vomiting). 30 tablet 1    traZODone (DESYREL) 50 MG tablet Take 1 tablet (50 mg total) by mouth at bedtime. 30 tablet 3    Scheduled:  dexamethasone  4 mg Oral Daily   furosemide  20 mg Oral Daily   levETIRAcetam  2,000 mg Oral BID   levothyroxine  100 mcg Oral Q0600   metoprolol succinate  100 mg Oral Daily   pantoprazole  40 mg Oral Daily  sodium chloride flush  3 mL Intravenous Q12H   traZODone  50 mg Oral QHS   Continuous:   ROS:                                                                                                                                       Has had some significant anxiety recently, which is worse at the time of her seizures. Has been experiencing new blurred vision in her left eye. Lists to the left when ambulating. Other ROS as per HPI.    Blood pressure (!) 118/57, pulse 61, temperature 98 F (36.7 C), temperature source Oral, resp. rate 17, height 5' 8"  (1.727 m), weight 66.5 kg, SpO2 99 %.   General Examination:                                                                                                       Physical Exam  HEENT-  [place]/AT. EEG leads in place.  Lungs- Respirations unlabored Extremities- Bilateral lower extremity edema  Neurological Examination Mental Status: Awake and alert. Oriented x 5. Thought content appropriate.  Speech fluent with intact naming and comprehension. Good insight. Pleasant and cooperative.  Cranial Nerves: II: Temporal visual fields intact with no extinction to DSS.  III,IV, VI: No ptosis.  EOMI except for difficulty with supraduction of left eye during upgaze. No nystagmus.  V: Temp sensation equal bilaterally.  VII: Smile symmetric VIII: Hearing intact to conversation IX,X: No hypophonia XI: Head is midline XII: No lingual dysarthria Motor: RUE 5/5 proximally and distally RLE 5/5 proximally and distally LUE 3-4/5 proximally with 4-/5 grip strength. Cannot fully extend digits. Cannot fully elevate LUE at shoulder antigravity. Tone decreased.  LLE 4+/5 HF, KE and KF. 4-/5 APF. 4+/5 ADF.  Sensory: Decreased temp sensation to LUE, subjectively normal to RUE and BLE.  Deep Tendon Reflexes: 2+ right brachioradialis and biceps. 1+ left brachioradialis and biceps. 2+ bilateral patellae Plantars: Right: downgoing   Left: upgoing Cerebellar: Normal FNF on right. Dysmetric FNF on the left.  Normal H-S on the right. Slower and lower amplitude H-S on the left.  Gait: Deferred Other: No jerking, twitching or other seizure-like movements noted.    Lab Results: Basic Metabolic Panel: Recent Labs  Lab 10/14/21 1153 10/15/21 0232  NA 139 136  K 4.0 4.6  CL 102 100  CO2 28 30  GLUCOSE 90 82  BUN 27* 28*  CREATININE 0.67 0.66  CALCIUM 9.5 8.8*    CBC:  Recent Labs  Lab 10/14/21 1153 10/15/21 0232  WBC 8.2 7.0  NEUTROABS 5.7  --   HGB 12.0 11.3*  HCT 35.8* 34.5*  MCV 107.5* 108.2*  PLT 92* 59*    Cardiac Enzymes: No results for input(s): CKTOTAL, CKMB, CKMBINDEX, TROPONINI in the last 168 hours.  Lipid Panel: No results for input(s): CHOL, TRIG, HDL, CHOLHDL, VLDL, LDLCALC in the last 168 hours.  Imaging: MR BRAIN WO CONTRAST  Result Date: 10/14/2021 CLINICAL DATA:  Seizures.  Metastatic breast cancer. EXAM: MRI HEAD WITHOUT CONTRAST TECHNIQUE: Multiplanar, multiecho pulse sequences of the brain and surrounding structures were obtained without intravenous contrast. COMPARISON:  CT head without contrast 10/04/2021. MR head without contrast 09/24/2021. MR head  without and with contrast 09/03/2021 FINDINGS: Brain: Continued progression of extensive white matter vasogenic edema is present in the high right frontal lobe and portions of the parietal lobe. There is increasing mass effect with effacement of the sulci. Minimal effacement of the right lateral ventricle noted. No significant midline shift is present. Two areas of cortical susceptibility are again noted in the right frontal and parietal lobe corresponding with the areas of enhancement. These areas are stable. No new foci of susceptibility are present. Focal T2 signal at the right temporal tip is stable. No significant white matter changes are present in the left hemisphere or cerebellum. Brainstem is unremarkable. T2 shine through in susceptibility is present without definite restricted diffusion. Basal ganglia are within normal limits. The internal auditory canals are unremarkable. No new discrete lesions are present. Vascular: Flow is present in the major intracranial arteries. Skull and upper cervical spine: Extensive osseous metastases are again noted. Sinuses/Orbits: The paranasal sinuses and mastoid air cells are clear. The globes and orbits are within normal limits. IMPRESSION: 1. Continued progression of extensive white matter vasogenic edema in the high right frontal lobe and portions of the right parietal lobe. PET scan 09/15/2021 demonstrated no increased activity relative to the adjacent areas in suggest tumor necrosis. Progressive white matter changes may reflect reactive disease rather than metastatic progression. The diffuse white matter progression is likely associated with the seizure activity. 2. Stable areas of cortical susceptibility in the right frontal and parietal lobe corresponding with the areas of enhancement. These represent areas of treated metastases demonstrating calcification on the CT scan. 3. Stable focal T2 signal at the right temporal tip. 4. No new foci of susceptibility to  suggest metastatic disease. 5. Extensive osseous metastases. Electronically Signed   By: San Morelle M.D.   On: 10/14/2021 18:47     Assessment: 69 year old female with metastatic breast cancer presenting with worsening left sided weakness and intermittent left thumb twitching after seizure recurrence secondary to brain metastases 1. Exam reveals left sided deficits best localizable as right cerebral hemisphere focal cortical dysfunction.  2. 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  3. MRI brain: Continued progression of extensive white matter vasogenic edema in the high right frontal lobe and portions of the right parietal lobe. PET scan 09/15/2021 demonstrated no increased activity relative to the adjacent areas in suggest tumor necrosis. Progressive white matter changes may reflect reactive disease rather than metastatic progression. The diffuse white matter progression is likely associated with the seizure activity. Stable areas of cortical susceptibility in the right frontal and parietal lobe corresponding with the areas of enhancement. These represent areas of treated metastases demonstrating calcification on the CT scan. Stable focal T2 signal at the  right temporal tip. No new foci of susceptibility to suggest new metastatic disease. Extensive osseous metastases. 4. The patient states that she no longer drives due to her seizures. I expressed agreement that she should not drive due to potential risk to herself and others.     Recommendations: 1. LTM EEG.  2. Titrate AEDs against EEG, for now will continue Keppra 2000 mg BID as the sole anticonvulsant pending EEG results. Per Dr. Mickeal Skinner, next agent to add would be Vimpat. 3. Also per Dr. Mickeal Skinner, concerning adverse affects from corticosteroids, he recommended keeping Decadron at 4 mg daily for now. His plan is to treat suspected radio-inflammatory process  with Avastin infusion scheduled for 10/26/21 along with Enhertu.   4. Dr. Mickeal Skinner is favorable to up-titrating diureses for steroid-associated dependent edema as needed 5. Will need more intensive rehab and wound care 6. Inpatient seizure precautions.  7. Add-on MRI with contrast has been ordered as initial MRI brain was performed without contrast.    Electronically signed: Dr. Kerney Elbe 10/15/2021, 7:20 AM

## 2021-10-15 NOTE — Telephone Encounter (Signed)
[name] Riley called to let you know [name] Riley was admitted to a room last night and she did have another seizure this morning around 7:30.

## 2021-10-15 NOTE — Procedures (Signed)
Patient Name: [name] Riley  MRN: [id number]  Epilepsy Attending: Lora Havens  Referring Physician/Provider: Dr Kerney Elbe Date: 10/15/2021 Duration: 23.27 mins  Patient history: 70 year old female with metastatic brain disease presented with seizure. EEG to evaluate for seizure.  Level of alertness: Awake  AEDs during EEG study: LEV  Technical aspects: This EEG study was done with scalp electrodes positioned according to the 10-20 International system of electrode placement. Electrical activity was acquired at a sampling rate of 500Hz  and reviewed with a high frequency filter of 70Hz  and a low frequency filter of 1Hz . EEG data were recorded continuously and digitally stored.   Description: The posterior dominant rhythm consists of 9 Hz activity of moderate voltage (25-35 uV) seen predominantly in posterior head regions, symmetric and reactive to eye opening and eye closing.  EEG showed continuous 5 to 6 Hz theta slowing in right posterior quadrant, maximal right parietal region. Spikes were also noted in right parieto-occipital region, maximal P4. Hyperventilation and photic stimulation were not performed.     ABNORMALITY - Spike, right parieto-occipital region, maximal P4.  - Continuous slow, right posterior quadrant  IMPRESSION: This study showed evidence of epileptogenicity arising from right parieto-occipital region. There is also cortical dysfunction arising from right posterior quadrant, likely secondary to underlying mass.  No seizures were seen throughout the recording.  [name] [name]

## 2021-10-15 NOTE — Procedures (Addendum)
Patient Name: [name] Riley  MRN: [id number]  Epilepsy Attending: Lora Havens  Referring Physician/Provider: Dr Kerney Elbe Duration: 10/15/2021 0435 to 10/16/2021 0435   Patient history: 70 year old female with metastatic brain disease presented with seizure. EEG to evaluate for seizure.   Level of alertness: Awake, asleep   AEDs during EEG study: LEV   Technical aspects: This EEG study was done with scalp electrodes positioned according to the 10-20 International system of electrode placement. Electrical activity was acquired at a sampling rate of 500Hz  and reviewed with a high frequency filter of 70Hz  and a low frequency filter of 1Hz . EEG data were recorded continuously and digitally stored.    Description: The posterior dominant rhythm consists of 9 Hz activity of moderate voltage (25-35 uV) seen predominantly in posterior head regions, symmetric and reactive to eye opening and eye closing.  Sleep was characterized by vertex waves, sleep spindles (12 to 14 Hz), maximum frontocentral region.  EEG showed continuous 5 to 6 Hz theta slowing in right posterior quadrant, maximal right parietal region. Spikes were also noted in right parieto-occipital region, maximal P4. Hyperventilation and photic stimulation were not performed.     One seizure without clinical signs was noted on 10/15/2021 at 0533. Concomitant EEG showed spikes in right posterior quadrant, maximal P4 which appeared rhythmic admixed with sharply contoured 6 to 9 Hz theta-alpha activity which gradually evolved into 3 to 4 Hz theta slowing.  Seizure lasted for about 7 minutes  One seizure was noted on 10/15/2021 at 0721 during which patient was noted to have left arm twitching. Concomitant EEG showed spikes in right posterior quadrant, maximal P4 which appeared rhythmic admixed with sharply contoured 6 to 9 Hz theta-alpha activity which gradually evolved into 3 to 4 Hz theta slowing.  IV Ativan was administered at 0742.  Seizure lasted for about  26 minutes   ABNORMALITY -Focal convulsive status epilepticus, right posterior quadrant - Spike, right parieto-occipital region, maximal P4.  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 IV Ativan was administered at 0742, seizure ended at 0747.   One seizure without clinical signs was also noted on 10/15/2021 at 0533, lasting about 7 minutes.  Additionally there is evidence of epileptogenicity arising from right parieto-occipital region. There is also cortical dysfunction arising from right posterior quadrant, likely secondary to underlying mass.    [name]

## 2021-10-15 NOTE — Progress Notes (Signed)
EEG maintenance performed.  No skin breakdown observed at electrode sites F8, Fp1, Fp2.

## 2021-10-15 NOTE — Progress Notes (Signed)
Patient had a seizure with left arm switching and some stomach symptoms per patient.  It lasted roughly 17 minutes until ativan was given IV per Hortense Ramal, MD and it resolved

## 2021-10-15 NOTE — Progress Notes (Signed)
Subjective: Had an almost 30-minute focal motor seizures this morning.  Was given IV Ativan.  Now is very drowsy and not following commands  ROS: negative except above  Examination  Vital signs in last 24 hours: Temp:  [97.5 F (36.4 C)-98.8 F (37.1 C)] 98 F (36.7 C) (12/30 0440) Pulse Rate:  [61-78] 61 (12/30 0440) Resp:  [12-19] 14 (12/30 0750) BP: (110-158)/(57-78) 118/57 (12/30 0440) SpO2:  [98 %-100 %] 99 % (12/30 0440) Weight:  [66.5 kg] 66.5 kg (12/30 0022)  General: lying in bed, NAD CVS: pulse-normal rate and rhythm RS: breathing comfortably, CTAB Extremities: warm, BL LE edema Neuro: says "umm" but does not open eyes, does not follow commands, PERRLA, no forced gaze deviation, no apparent facial asymmetry, left upper extremity plegic, antigravity strength in right upper extremity and right lower extremity, does appear to have some antigravity strength in left lower extremity but weaker than right lower extremity, no clinical twitching  Basic Metabolic Panel: Recent Labs  Lab 10/14/21 1153 10/15/21 0232  NA 139 136  K 4.0 4.6  CL 102 100  CO2 28 30  GLUCOSE 90 82  BUN 27* 28*  CREATININE 0.67 0.66  CALCIUM 9.5 8.8*    CBC: Recent Labs  Lab 10/14/21 1153 10/15/21 0232  WBC 8.2 7.0  NEUTROABS 5.7  --   HGB 12.0 11.3*  HCT 35.8* 34.5*  MCV 107.5* 108.2*  PLT 92* 59*     Coagulation Studies: No results for input(s): LABPROT, INR in the last 72 hours.  Imaging  MRI brain without contrast 10/14/2021: 1. Continued progression of extensive white matter vasogenic edema in the high right frontal lobe and portions of the right parietal lobe. PET scan 09/15/2021 demonstrated no increased activity relative to the adjacent areas in suggest tumor necrosis. Progressive white matter changes may reflect reactive disease rather than metastatic progression. The diffuse white matter progression is likely associated with the seizure activity. 2. Stable areas of  cortical susceptibility in the right frontal and parietal lobe corresponding with the areas of enhancement. These represent areas of treated metastases demonstrating calcification on the CT scan. 3. Stable focal T2 signal at the right temporal tip. 4. No new foci of susceptibility to suggest metastatic disease. 5. Extensive osseous metastases.   ASSESSMENT AND PLAN: 70 year old female with metastatic brain disease presenting with recurrent seizures while on Keppra 2000 mg twice daily.  Focal convulsive status epilepticus Acute encephalopathy -Patient had an episode of left upper extremity twitching consistent with focal motor seizure which lasted for about 27 minutes, resolved after administering IV Ativan 2 mg -Encephalopathy likely due to postictal state, antiseizure medications and Ativan  Recommendations -We will obtain EKG and if no PR prolongation, will start Vimpat 50mg  BID -Continue Keppra 2000 mg twice daily -Continue LTM EEG to look for intermittent seizures -MRI brain with contrast ordered and pending -Continue dexamethasone 4 mg daily -Continue seizure precautions -As needed IV Ativan 2 mg for focal seizure lasting more than 5 minutes.  Please notify neurology if administered -Management of rest of comorbidities per primary team   CRITICAL CARE Performed by: Lora Havens   Total critical care time: 40 minutes  Critical care time was exclusive of separately billable procedures and treating other patients.  Critical care was necessary to treat or prevent imminent or life-threatening deterioration.  Critical care was time spent personally by me on the following activities: development of treatment plan with patient and/or surrogate as well as nursing, discussions with consultants, evaluation  of patient's response to treatment, examination of patient, obtaining history from patient or surrogate, ordering and performing treatments and interventions, ordering and review  of laboratory studies, ordering and review of radiographic studies, pulse oximetry and re-evaluation of patient's condition.   Zeb Comfort Epilepsy [facility] For questions after 5pm please refer to AMION to reach the Neurologist on call

## 2021-10-15 NOTE — Progress Notes (Signed)
LTM EEG hooked up and running - no initial skin breakdown - push button tested - neuro notified. [facility] monitoring.  

## 2021-10-16 ENCOUNTER — Inpatient Hospital Stay (HOSPITAL_COMMUNITY): Payer: Medicare Other

## 2021-10-16 DIAGNOSIS — C7931 Secondary malignant neoplasm of brain: Secondary | ICD-10-CM

## 2021-10-16 DIAGNOSIS — G40901 Epilepsy, unspecified, not intractable, with status epilepticus: Principal | ICD-10-CM

## 2021-10-16 DIAGNOSIS — R569 Unspecified convulsions: Secondary | ICD-10-CM

## 2021-10-16 LAB — COMPREHENSIVE METABOLIC PANEL
ALT: 78 U/L — ABNORMAL HIGH (ref 0–44)
AST: 104 U/L — ABNORMAL HIGH (ref 15–41)
Albumin: 2.6 g/dL — ABNORMAL LOW (ref 3.5–5.0)
Alkaline Phosphatase: 373 U/L — ABNORMAL HIGH (ref 38–126)
Anion gap: 8 (ref 5–15)
BUN: 21 mg/dL (ref 8–23)
CO2: 27 mmol/L (ref 22–32)
Calcium: 8.4 mg/dL — ABNORMAL LOW (ref 8.9–10.3)
Chloride: 99 mmol/L (ref 98–111)
Creatinine, Ser: 0.67 mg/dL (ref 0.44–1.00)
GFR, Estimated: >60 mL/min (ref >60)
Glucose, Bld: 84 mg/dL (ref 70–99)
Potassium: 3.8 mmol/L (ref 3.5–5.1)
Sodium: 134 mmol/L — ABNORMAL LOW (ref 135–145)
Total Bilirubin: 1 mg/dL (ref 0.3–1.2)
Total Protein: 5.5 g/dL — ABNORMAL LOW (ref 6.5–8.1)

## 2021-10-16 LAB — CBC
HCT: 33.3 % — ABNORMAL LOW (ref 36.0–46.0)
Hemoglobin: 11.5 g/dL — ABNORMAL LOW (ref 12.0–15.0)
MCH: 36.1 pg — ABNORMAL HIGH (ref 26.0–34.0)
MCHC: 34.5 g/dL (ref 30.0–36.0)
MCV: 104.4 fL — ABNORMAL HIGH (ref 80.0–100.0)
Platelets: 51 10*3/uL — ABNORMAL LOW (ref 150–400)
RBC: 3.19 MIL/uL — ABNORMAL LOW (ref 3.87–5.11)
RDW: 13.9 % (ref 11.5–15.5)
WBC: 5.1 10*3/uL (ref 4.0–10.5)
nRBC: 0 % (ref 0.0–0.2)

## 2021-10-16 LAB — AMMONIA: Ammonia: 47 umol/L — ABNORMAL HIGH (ref 9–35)

## 2021-10-16 MED ORDER — SODIUM CHLORIDE 0.9 % IV SOLN
50.0000 mg | Freq: Two times a day (BID) | INTRAVENOUS | Status: DC | PRN
  Filled 2021-10-16: qty 5

## 2021-10-16 MED ORDER — LACTULOSE 10 GM/15ML PO SOLN
10.0000 g | Freq: Two times a day (BID) | ORAL | Status: DC | PRN
  Administered 2021-10-17: 10 g via ORAL
  Filled 2021-10-16: qty 30

## 2021-10-16 NOTE — Progress Notes (Signed)
EEG discontinued at bedside. No skin breakdown noted. Results pending.

## 2021-10-16 NOTE — Progress Notes (Signed)
Subjective: no further reports of seizure this morning, only feels flushed in her face.  ROS: negative except above  Examination  Vital signs in last 24 hours: Temp:  [97.7 F (36.5 C)-98.4 F (36.9 C)] 98.3 F (36.8 C) (12/31 1158) Pulse Rate:  [61-107] 80 (12/31 1158) Resp:  [10-17] 17 (12/31 0729) BP: (96-132)/(58-76) 114/64 (12/31 1158) SpO2:  [97 %-100 %] 98 % (12/31 1158)  General: sitting up in bed, NAD CVS: pulse-normal rate and rhythm RS: breathing comfortably, CTAB Extremities: warm, BL LE edema Neuro: alert and oriented husband at bedside says she is back to normal. Follow commands consistently, PERRLA, no apparent facial asymmetry, antigravity strength in right upper extremity and right lower extremity, does appear to have some antigravity strength in left lower extremity but weaker than right lower extremity There was no apparent clinical twitching.  Basic Metabolic Panel: Recent Labs  Lab 10/14/21 1153 10/15/21 0232 10/16/21 0148  NA 139 136 134*  K 4.0 4.6 3.8  CL 102 100 99  CO2 28 30 27   GLUCOSE 90 82 84  BUN 27* 28* 21  CREATININE 0.67 0.66 0.67  CALCIUM 9.5 8.8* 8.4*     CBC: Recent Labs  Lab 10/14/21 1153 10/15/21 0232 10/16/21 0148  WBC 8.2 7.0 5.1  NEUTROABS 5.7  --   --   HGB 12.0 11.3* 11.5*  HCT 35.8* 34.5* 33.3*  MCV 107.5* 108.2* 104.4*  PLT 92* 59* 51*     Coagulation Studies: No results for input(s): LABPROT, INR in the last 72 hours.  Imaging  MRI brain without contrast 10/14/2021: 1. Continued progression of extensive white matter vasogenic edema in the high right frontal lobe and portions of the right parietal lobe. PET scan 09/15/2021 demonstrated no increased activity relative to the adjacent areas in suggest tumor necrosis. Progressive white matter changes may reflect reactive disease rather than metastatic progression. The diffuse white matter progression is likely associated with the seizure activity. 2. Stable areas  of cortical susceptibility in the right frontal and parietal lobe corresponding with the areas of enhancement. These represent areas of treated metastases demonstrating calcification on the CT scan. 3. Stable focal T2 signal at the right temporal tip. 4. No new foci of susceptibility to suggest metastatic disease. 5. Extensive osseous metastases.   LTM 10/15/2021 to 10/16/2021:  No definite seizures were seen during this study.  Epileptogenicity and cortical dysfunction arising from right posterior quadrant.   EKG 10/15/2021: PR interval 117ms  ASSESSMENT AND PLAN: 70 year old female with metastatic brain disease presenting with recurrent seizures while on Keppra 2000 mg twice daily and lacosamide 50mg  twice daily started.  Focal convulsive status epilepticus - resolved. Acute encephalopathy - resolved.   Recommendations -continue lacosamide 50mg  twice daily -Continue Keppra 2000 mg twice daily -Discontinue LTM EEG and if another seizure will restart. -MRI brain with contrast pending -Continue dexamethasone 4 mg daily -Continue seizure precautions -As needed IV Ativan 2 mg for focal seizure lasting more than 5 minutes.  Please notify neurology if administered -Management of rest of comorbidities per primary team  Electronically signed by:  Lynnae Sandhoff, MD Page: [phone] 10/16/2021, 12:15 PM

## 2021-10-16 NOTE — Procedures (Addendum)
Patient Name: [name] Riley  MRN: [id number]  Epilepsy Attending: Lora Havens  Referring Physician/Provider: Dr Kerney Elbe Duration: 10/16/2021 0435 to 10/16/2021 1109   Patient history: 70 year old female with metastatic brain disease presented with seizure. EEG to evaluate for seizure.   Level of alertness: Awake, asleep   AEDs during EEG study: LEV   Technical aspects: This EEG study was done with scalp electrodes positioned according to the 10-20 International system of electrode placement. Electrical activity was acquired at a sampling rate of 500Hz  and reviewed with a high frequency filter of 70Hz  and a low frequency filter of 1Hz . EEG data were recorded continuously and digitally stored.    Description: The posterior dominant rhythm consists of 9 Hz activity of moderate voltage (25-35 uV) seen predominantly in posterior head regions, symmetric and reactive to eye opening and eye closing.  Sleep was characterized by vertex waves, sleep spindles (12 to 14 Hz), maximum frontocentral region.   EEG showed continuous 5 to 6 Hz theta slowing in right posterior quadrant, maximal right parietal region. Spikes were also noted in right parieto-occipital region, maximal P4 which at times appear periodic at 1hz . Hyperventilation and photic stimulation were not performed.       ABNORMALITY - Spike, right parieto-occipital region, maximal P4.  - Continuous slow, right posterior quadrant   IMPRESSION: This study showed epileptogenicity and cortical dysfunction arising from right posterior quadrant, likely secondary to underlying mass. No definite seizures were seen during this study.    Karrina Lye [name]

## 2021-10-16 NOTE — Progress Notes (Signed)
PT Cancellation Note  Patient Details Name: [name] Riley MRN: [id number] DOB: 1951-08-23   Cancelled Treatment:    Reason Eval/Treat Not Completed: At the time of PT attempt, pt sitting up in the bed eating breakfast. Pt avoids eye contact and speaks to her husband to speak to me and state she will not be doing any physical therapy. Will check back as schedule allows to attempt PT evaluation again.   Thelma Comp 10/16/2021, 10:50 AM  Rolinda Roan, PT, DPT Acute Rehabilitation Services Pager: [phone] Office: [phone]

## 2021-10-16 NOTE — Progress Notes (Signed)
PROGRESS NOTE  [name] Riley  DOB: June 05, 1951  PCP: [name], MD ZOX:[id number]  DOA: 10/14/2021  LOS: 2 days  Hospital Day: 3  Chief Complaint: Increased left-sided weakness, confusion, left thumb twitching    Brief narrative: [name] Riley is a 70 y.o. 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 Patient presented to the ED on 12/29 at the direction of her neuro oncologist for evaluation of increased left-sided weakness, possible left hand twitching, and concern for Todd's paralysis versus occult status epilepticus.   Patient was recently hospitalized 12/19-12/20 for seizures with Todd's paralysis and was discharged on higher dose of Keppra and continued on Decadron.  She declined to go to SNF at that time and went home with her husband. At home, patient experienced progressive left-sided weakness to the point of inability to walk associated with increased leg swelling, confusion, hallucination.  She also had left arm twitching multiple times a day. On 12/29, she saw her neuro oncologist and was directed to the ED for further evaluation management   In the ED, patient was afebrile, hemodynamically stable, breathing on room air  Labs showed elevated LFTs MRI brain without contrast showed continued progression of extensive white matter vasogenic edema.   Admitted to hospitalist service  Neurology was consulted   Subjective: Patient was seen and examined this morning.  Propped up in bed.  Not in distress.  She is more alert, awake and oriented today.  Husband at bedside. Chart reviewed. No fever.  LFTs improving, ammonia level elevated at 47.  Assessment/Plan: Progressive neurological symptoms in the setting of brain metastasis  -Presented with increased left sided weakness, confusion, and left thumb twitching concerning for occult seizures -Noted on exam to have  left-sided deficits. -MRI brain showed continued progression of extensive white matter festinate edema in the high right frontal lobe and portions of the right parietal lobe. 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  No definite seizure noted.  But it showed epileptogenicity and cortical dysfunction arising from right posterior quadrant likely secondary to underlying mass. -Per neurology recommendation, patient is currently on Keppra 2000 mg twice daily, Vimpat 50 mg twice daily pending MRI brain with contrast.  Continue seizure precautions. -Per neuro-oncology recommendation, to continue Decadron at 4 mg daily for now -Noted a plan from neuro-oncology to treat suspected radial inflammatory process with Avastin infusion scheduled for 10/26/2021 along with Enhertu.  Metastatic breast cancer  -left breast cancer (invasive ductal carcinoma, ER/PR +,HER2-) s/p mastectomy in 2013, history of chemotherapy and radiation therapy, local recurrence of breast cancer in 2017, breast cancer metastases to brain and several bones, s/p SRS in 2020, now on Enhertu    Elevated liver enzymes -unclear how much of work-up has been done for this as an outpatient.  High likelihood of association with malignancy.  -On clinical examination, noted that she has distended abdomen and bilateral lower extremity swelling.  Need to rule out portal hypotension and ascites. -Liver ultrasound on 12/30 showed increase in size of liver masses suggestive of worsening metastatic disease.  Also showed heterogeneous nodular liver worrisome for diffuse infiltrative disease including liver cirrhosis.  Anemia level elevated to 47 today.  X-ray abdomen with moderate stool burden.  I will start on lactulose twice daily as needed. Recent Labs  Lab 10/15/21  8315 10/16/21 0148  AST 119*  104*  ALT 87* 78*  ALKPHOS 383* 373*  BILITOT 1.0 1.0  PROT 5.5* 5.5*  ALBUMIN 2.7* 2.6*   Essential hypertension -Home meds include 100 mg daily and Lasix 20 mg daily. -Since patient may need higher dose of diuresis due to pedal edema, I would cut back on metoprolol. -Currently on metoprolol tartrate 25 mg daily and Lasix 20 mg daily.  CAD  -she reports hx of MI at age 63  -No current anginal complaints  -Statin on hold due to elevated LFTs. Not on antiplatelets. -Continue metoprolol.   Bilateral pedal edema -Pt complains of increased leg swelling over past couple weeks, no respiratory complaints or JVD -probably related to Decadron. -She had preserved EF on TTE from 09/27/21  -Albumin was 2.5 recently and she has been on Decadron  -Elevate legs, continue Lasix 20 mg daily   Thrombocytopenia  -Platelets 92k on admission.  Down to 46 today.  Watch out for bleeding episodes.  Recent Labs  Lab 10/14/21 1153 10/15/21 0232 10/16/21 0148  PLT 92* 59* 51*   Hypothyroidism  -Continue Synthroid   Mobility: Needs PT eval once mental status improves Living condition: Home Goals of care:   Code Status: DNR.  Per H&P, this was confirmed by patient and husband at admission Nutritional status: Body mass index is 22.29 kg/m.      Diet:  Diet Order             Diet Heart Room service appropriate? Yes; Fluid consistency: Thin  Diet effective now                  DVT prophylaxis:  SCDs Start: 10/14/21 2046   Antimicrobials: None Fluid: None at this time Consultants: Neurology Family Communication: None at bedside  Status is: Inpatient  Continue in-hospital care because: Ongoing work-up for seizure Level of care: Telemetry Medical   Dispo: The patient is from: Home              Anticipated d/c is to: Likely needs placement              Patient currently is not medically stable to d/c.   Difficult to place patient No     Infusions:   lacosamide (VIMPAT) IV      levETIRAcetam      Scheduled Meds:  Chlorhexidine Gluconate Cloth  6 each Topical Daily   dexamethasone  4 mg Oral Daily   furosemide  20 mg Oral Daily   lacosamide  50 mg Oral Q12H   levETIRAcetam  2,000 mg Oral BID   levothyroxine  100 mcg Oral Q0600   metoprolol tartrate  25 mg Oral BID   pantoprazole  40 mg Oral Daily   sodium chloride flush  3 mL Intravenous Q12H   traZODone  50 mg Oral QHS    PRN meds: acetaminophen **OR** acetaminophen, bisacodyl, lactulose, LORazepam, ondansetron **OR** ondansetron (ZOFRAN) IV   Antimicrobials: Anti-infectives (From admission, onward)    None       Objective: Vitals:   10/16/21 0729 10/16/21 1158  BP: 132/74 114/64  Pulse: 73 80  Resp: 17   Temp: 97.9 F (36.6 C) 98.3 F (36.8 C)  SpO2: 100% 98%    Intake/Output Summary (Last 24 hours) at 10/16/2021 1609 Last data filed at 10/15/2021 2230 Gross per 24 hour  Intake --  Output 1700 ml  Net -1700 ml   Filed Weights   10/15/21 0022  Weight: 66.5  kg   Weight change:  Body mass index is 22.29 kg/m.   Physical Exam: General exam: Pleasant, elderly Caucasian female.  Not in physical distress Skin: No rashes, lesions or ulcers. HEENT: Atraumatic, normocephalic, no obvious bleeding Lungs: Clear to auscultation bilaterally CVS: Regular rate and rhythm, no murmur GI/Abd soft, nontender, improved distention.  Bowel sound present CNS: Alert, awake, oriented x3. Psychiatry: Mood appropriate Extremities: Pedal edema 1+ bilaterally with chronic cellulitis  Data Review: I have personally reviewed the laboratory data and studies available.  F/u labs ordered Unresulted Labs (From admission, onward)     Start     Ordered   10/15/21 0500  Comprehensive metabolic panel  Daily,   R      10/14/21 2049   10/15/21 0500  CBC  Daily,   R      10/14/21 2049   10/14/21 2000  Resp Panel by RT-PCR (Flu A&B, Covid) Nasopharyngeal Swab  (Tier 2 - Symptomatic/asymptomatic)  Once,    STAT        10/14/21 2000            Signed, Terrilee Croak, MD [facility] 10/16/2021

## 2021-10-16 NOTE — Progress Notes (Signed)
LTM maint complete - no skin breakdown [facility] monitored, Event button test confirmed by [facility]. ? ?

## 2021-10-16 NOTE — Progress Notes (Signed)
Inpatient Rehab Admissions Coordinator Note:   Per PT patient was screened for CIR candidacy by Saman Giddens Danford Bad, CCC-SLP. As noted, pt fatigued during tx. Pt may have potential to progress to becoming a potential CIR candidate. CIR admissions team will follow and monitor for progress with therapies. A consult order will be placed if pt appears to be an appropriate candidate.    Gayland Curry, MS, CCC-SLP Admissions Coordinator [phone] 10/16/21 5:00 PM

## 2021-10-16 NOTE — Evaluation (Signed)
Physical Therapy Evaluation Patient Details Name: [name] Riley MRN: [id number] DOB: 04-Sep-1951 Today's Date: 10/16/2021  History of Present Illness  Pt is a 70 y/o female who presents with recurrent seizures while on Keppra and progressive weakening of L side. Recent admission at [facility] for seizures. PMH significant for breast cancer with metastases to brain, bone and liver, HTN, CAD, hypothyroidism.   Clinical Impression  Pt admitted with above diagnosis. Pt currently with functional limitations due to the deficits listed below (see PT Problem List). At the time of PT eval pt was able to perform transfers with up to +2 max assist. Pt fatigued but motivated to participate with PT. Many questions regarding the difference between [facility], SNF, and AIR level rehabs. Pt and husband are motivated for AIR, and feel this would be reasonable to maximize functional independence and decrease caregiver burden. However, based on performance today unsure if pt will be able to tolerate the intensity of an AIR admission. Will continue to follow and attempt to progress as able however if pt is unable to tolerate increased activity next session may want to consider a higher level of care. Acutely, pt will benefit from skilled PT to increase their independence and safety with mobility to allow discharge to the venue listed below.          Recommendations for follow up therapy are one component of a multi-disciplinary discharge planning process, led by the attending physician.  Recommendations may be updated based on patient status, additional functional criteria and insurance authorization.  Follow Up Recommendations Acute inpatient rehab (3hours/day)    Assistance Recommended at Discharge Frequent or constant Supervision/Assistance  Functional Status Assessment Patient has had a recent decline in their functional status and demonstrates the ability to make significant improvements in function in a reasonable and  predictable amount of time.  Equipment Recommendations  Other (comment) (TBD by next venue of care)    Recommendations for Other Services Rehab consult     Precautions / Restrictions Precautions Precautions: Fall Precaution Comments: L side weakness Restrictions Weight Bearing Restrictions: No      Mobility  Bed Mobility Overal bed mobility: Needs Assistance Bed Mobility: Supine to Sit;Sit to Supine     Supine to sit: Mod assist Sit to supine: Max assist   General bed mobility comments: hand-over-hand assist to reach for the railing on the L with the RUE. Pt moving slow and without regard for LUE. Pt required assist with bed pad for scooting towards EOB. For return to supine, pt required assist for LE elevation back up into bed and for scooting towards HOB.    Transfers Overall transfer level: Needs assistance Equipment used: Rolling walker (2 wheels) Transfers: Sit to/from Stand Sit to Stand: Max assist;+2 physical assistance;+2 safety/equipment;From elevated surface          Lateral/Scoot Transfers: Mod assist;+2 physical assistance General transfer comment: Therapist supporting LUE while pt powered up to walker that husband was supporting. Max assist provided but pt was not able to fully extend hips/knees to achieve full stand. Attempted x2. Lateral scoot transfer up towards [facility] before initiating sit>supine.    Ambulation/Gait                  Stairs            Wheelchair Mobility    Modified Rankin (Stroke Patients Only)       Balance Overall balance assessment: Needs assistance Sitting-balance support: Feet supported;No upper extremity supported Sitting balance-Leahy Scale: Poor Sitting  balance - Comments: approaching fair at EOB. Increased time to gain/maintain sitting balance. Postural control: Left lateral lean;Posterior lean Standing balance support: Bilateral upper extremity supported;Reliant on assistive device for balance Standing  balance-Leahy Scale: Zero Standing balance comment: max assist required                             Pertinent Vitals/Pain Pain Assessment: No/denies pain    Home Living Family/patient expects to be discharged to:: Inpatient rehab Living Arrangements: Spouse/significant other Available Help at Discharge: Family;Available 24 hours/day Type of Home: House Home Access: Stairs to enter Entrance Stairs-Rails: Right;Left;Can reach both Entrance Stairs-Number of Steps: 4 Alternate Level Stairs-Number of Steps: 8 landing + 8 Home Layout: Two level;Able to live on main level with bedroom/bathroom;Full bath on main level Home Equipment: Wheelchair - IT trainer (2 wheels) (gait belt)      Prior Function Prior Level of Function : Needs assist       Physical Assist : Mobility (physical);ADLs (physical) Mobility (physical): Bed mobility;Transfers;Gait;Stairs ADLs (physical): Grooming;Bathing;Dressing;Toileting;IADLs Mobility Comments: SPT to wheelchair; taking a couple steps at a time in the house ADLs Comments: family assist to sponge bathe, dress and do limited household chores like folding towels.     Hand Dominance   Dominant Hand: Right    Extremity/Trunk Assessment   Upper Extremity Assessment Upper Extremity Assessment: LUE deficits/detail LUE Deficits / Details: Able to grip with increased time to initiate. 2/5 strength for shoulder flexion, and 1/5 strength for wrist flexion/extensiono, elbow flexion/extension. LUE Sensation: decreased proprioception;decreased light touch LUE Coordination: decreased gross motor;decreased fine motor    Lower Extremity Assessment Lower Extremity Assessment: LLE deficits/detail LLE Deficits / Details: Grossly 2/5 strength in quads, hip flexors, ankle dorsiflexors, hamstrings. LLE Sensation: decreased proprioception;decreased light touch LLE Coordination: decreased gross motor;decreased fine motor    Cervical /  Trunk Assessment Cervical / Trunk Assessment: Other exceptions Cervical / Trunk Exceptions: forward head posture with rounded shoulders  Communication   Communication: No difficulties  Cognition Arousal/Alertness: Awake/alert Behavior During Therapy: Flat affect Overall Cognitive Status: Impaired/Different from baseline Area of Impairment: Orientation;Memory;Following commands;Safety/judgement;Awareness;Problem solving                 Orientation Level: Disoriented to;Time   Memory: Decreased short-term memory Following Commands: Follows one step commands consistently;Follows one step commands with increased time;Follows multi-step commands inconsistently Safety/Judgement: Decreased awareness of deficits;Decreased awareness of safety Awareness: Intellectual Problem Solving: Slow processing;Decreased initiation;Difficulty sequencing;Requires verbal cues;Requires tactile cues General Comments: Motor apraxia noted. Difficulty completing a task with verbal cues only. Requires demonstration and/or tactile cues.        General Comments      Exercises     Assessment/Plan    PT Assessment Patient needs continued PT services  PT Problem List Decreased strength;Decreased activity tolerance;Decreased balance;Decreased mobility;Decreased coordination       PT Treatment Interventions DME instruction;Gait training;Stair training;Functional mobility training;Therapeutic exercise;Balance training;Neuromuscular re-education;Patient/family education;Therapeutic activities    PT Goals (Current goals can be found in the Care Plan section)  Acute Rehab PT Goals Patient Stated Goal: return home with family to assist PT Goal Formulation: With patient Time For Goal Achievement: 10/30/21 Potential to Achieve Goals: Fair    Frequency Min 3X/week   Barriers to discharge        Co-evaluation               AM-PAC PT "6 Clicks" Mobility  Outcome Measure Help needed turning  from  your back to your side while in a flat bed without using bedrails?: A Lot Help needed moving from lying on your back to sitting on the side of a flat bed without using bedrails?: A Lot Help needed moving to and from a bed to a chair (including a wheelchair)?: A Lot Help needed standing up from a chair using your arms (e.g., wheelchair or bedside chair)?: Total Help needed to walk in hospital room?: Total Help needed climbing 3-5 steps with a railing? : Total 6 Click Score: 9    End of Session Equipment Utilized During Treatment: Gait belt Activity Tolerance: Patient tolerated treatment well;Patient limited by fatigue Patient left: in bed;with call bell/phone within reach Nurse Communication: Mobility status PT Visit Diagnosis: Unsteadiness on feet (R26.81);Other abnormalities of gait and mobility (R26.89);Muscle weakness (generalized) (M62.81)    Time: 4944-9675 PT Time Calculation (min) (ACUTE ONLY): 36 min   Charges:   PT Evaluation $PT Eval Moderate Complexity: 1 Mod PT Treatments $Therapeutic Activity: 8-22 mins        Rolinda Roan, PT, DPT Acute Rehabilitation Services Pager: [phone] Office: [phone]   Thelma Comp 10/16/2021, 3:41 PM

## 2021-10-17 ENCOUNTER — Inpatient Hospital Stay (HOSPITAL_COMMUNITY): Payer: Medicare Other

## 2021-10-17 DIAGNOSIS — G936 Cerebral edema: Secondary | ICD-10-CM

## 2021-10-17 DIAGNOSIS — C50919 Malignant neoplasm of unspecified site of unspecified female breast: Secondary | ICD-10-CM

## 2021-10-17 LAB — CBC
HCT: 31.1 % — ABNORMAL LOW (ref 36.0–46.0)
Hemoglobin: 10.7 g/dL — ABNORMAL LOW (ref 12.0–15.0)
MCH: 35.9 pg — ABNORMAL HIGH (ref 26.0–34.0)
MCHC: 34.4 g/dL (ref 30.0–36.0)
MCV: 104.4 fL — ABNORMAL HIGH (ref 80.0–100.0)
Platelets: 61 10*3/uL — ABNORMAL LOW (ref 150–400)
RBC: 2.98 MIL/uL — ABNORMAL LOW (ref 3.87–5.11)
RDW: 14.1 % (ref 11.5–15.5)
WBC: 6.4 10*3/uL (ref 4.0–10.5)
nRBC: 0.3 % — ABNORMAL HIGH (ref 0.0–0.2)

## 2021-10-17 LAB — COMPREHENSIVE METABOLIC PANEL
ALT: 74 U/L — ABNORMAL HIGH (ref 0–44)
AST: 108 U/L — ABNORMAL HIGH (ref 15–41)
Albumin: 2.5 g/dL — ABNORMAL LOW (ref 3.5–5.0)
Alkaline Phosphatase: 363 U/L — ABNORMAL HIGH (ref 38–126)
Anion gap: 9 (ref 5–15)
BUN: 26 mg/dL — ABNORMAL HIGH (ref 8–23)
CO2: 26 mmol/L (ref 22–32)
Calcium: 8.1 mg/dL — ABNORMAL LOW (ref 8.9–10.3)
Chloride: 101 mmol/L (ref 98–111)
Creatinine, Ser: 0.82 mg/dL (ref 0.44–1.00)
GFR, Estimated: >60 mL/min (ref >60)
Glucose, Bld: 94 mg/dL (ref 70–99)
Potassium: 3.7 mmol/L (ref 3.5–5.1)
Sodium: 136 mmol/L (ref 135–145)
Total Bilirubin: 1 mg/dL (ref 0.3–1.2)
Total Protein: 5 g/dL — ABNORMAL LOW (ref 6.5–8.1)

## 2021-10-17 MED ORDER — GADOBUTROL 1 MMOL/ML IV SOLN
7.0000 mL | Freq: Once | INTRAVENOUS | Status: AC | PRN
  Administered 2021-10-17: 7 mL via INTRAVENOUS

## 2021-10-17 NOTE — Progress Notes (Signed)
PROGRESS NOTE  [name] Riley  DOB: 11-Aug-1951  PCP: [name], MD RAQ:[id number]  DOA: 10/14/2021  LOS: 3 days  Hospital Day: 4  Chief Complaint: Increased left-sided weakness, confusion, left thumb twitching    Brief narrative: [name] Riley is a 71 y.o. 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 Patient presented to the ED on 12/29 at the direction of her neuro oncologist for evaluation of increased left-sided weakness, possible left hand twitching, and concern for Todd's paralysis versus occult status epilepticus.   Patient was recently hospitalized 12/19-12/20 for seizures with Todd's paralysis and was discharged on higher dose of Keppra and continued on Decadron.  She declined to go to SNF at that time and went home with her husband. At home, patient experienced progressive left-sided weakness to the point of inability to walk associated with increased leg swelling, confusion, hallucination.  She also had left arm twitching multiple times a day. On 12/29, she saw her neuro oncologist and was directed to the ED for further evaluation management   In the ED, patient was afebrile, hemodynamically stable, breathing on room air  Labs showed elevated LFTs MRI brain without contrast showed continued progression of extensive white matter vasogenic edema.   Admitted to hospitalist service  Neurology was consulted   Subjective: Patient was seen and examined this morning.  Lying on bed.  Not in distress.  No new symptoms.  More alert today. States her last bowel movement was 2 days ago.  MRI pending.  Assessment/Plan: Progressive neurological symptoms in the setting of brain metastasis  -Presented with increased left sided weakness, confusion, and left thumb twitching concerning for occult seizures -Noted on exam to have left-sided deficits. -MRI brain showed continued  progression of extensive white matter festinate edema in the high right frontal lobe and portions of the right parietal lobe. 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  No definite seizure noted.  But it showed epileptogenicity and cortical dysfunction arising from right posterior quadrant likely secondary to underlying mass. -Per neurology recommendation, patient is currently on Keppra 2000 mg twice daily, Vimpat 50 mg twice daily pending MRI brain with contrast.  Continue seizure precautions. -Per neuro-oncology recommendation, to continue Decadron at 4 mg daily for now -Noted a plan from neuro-oncology to treat suspected radial inflammatory process with Avastin infusion scheduled for 10/26/2021 along with Enhertu.  Metastatic breast cancer  -left breast cancer (invasive ductal carcinoma, ER/PR +,HER2-) s/p mastectomy in 2013, history of chemotherapy and radiation therapy, local recurrence of breast cancer in 2017, breast cancer metastases to brain and several bones, s/p SRS in 2020, now on Enhertu    Elevated liver enzymes -unclear how much of work-up has been done for this as an outpatient.  High likelihood of association with malignancy.  -On clinical examination, noted that she has distended abdomen and bilateral lower extremity swelling.  Need to rule out portal hypotension and ascites. -Liver ultrasound on 12/30 showed increase in size of liver masses suggestive of worsening metastatic disease.  Also showed heterogeneous nodular liver worrisome for diffuse infiltrative disease including liver cirrhosis.  Ammonia level level was elevated.  Lactulose has been started. Recent Labs  Lab 10/15/21 0232 10/16/21 0148 10/17/21 0214  AST 119* 104* 108*  ALT 87* 78* 74*  ALKPHOS 383* 373*  363*  BILITOT 1.0 1.0 1.0  PROT 5.5* 5.5* 5.0*   ALBUMIN 2.7* 2.6* 2.5*    Essential hypertension -Home meds include 100 mg daily and Lasix 20 mg daily. -Continue Lasix.  Metoprolol on hold.  CAD  -she reports hx of MI at age 52  -No current anginal complaints  -Statin on hold due to elevated LFTs. Not on antiplatelets. -Continue metoprolol.   Bilateral pedal edema -Pt complains of increased leg swelling over past couple weeks, no respiratory complaints or JVD -probably related to Decadron. -She had preserved EF on TTE from 09/27/21  -Albumin was 2.5 recently and she has been on Decadron  -Elevate legs, continue Lasix 20 mg daily   Thrombocytopenia  -Platelets 92k on admission.  Down to 28 today.  Watch out for bleeding episodes.  Recent Labs  Lab 10/14/21 1153 10/15/21 0232 10/16/21 0148 10/17/21 0214  PLT 92* 59* 51* 61*    Hypothyroidism  -Continue Synthroid   Mobility: Needs PT eval once mental status improves Living condition: Home Goals of care:   Code Status: DNR.  Per H&P, this was confirmed by patient and husband at admission Nutritional status: Body mass index is 22.29 kg/m.      Diet:  Diet Order             Diet Heart Room service appropriate? Yes; Fluid consistency: Thin  Diet effective now                  DVT prophylaxis:  SCDs Start: 10/14/21 2046   Antimicrobials: None Fluid: None at this time Consultants: Neurology Family Communication: None at bedside  Status is: Inpatient  Continue in-hospital care because: Pending CIR Level of care: Telemetry Medical   Dispo: The patient is from: Home              Anticipated d/c is to: pending CIR              Patient currently is not medically stable to d/c.   Difficult to place patient No     Infusions:   lacosamide (VIMPAT) IV     levETIRAcetam      Scheduled Meds:  Chlorhexidine Gluconate Cloth  6 each Topical Daily   dexamethasone  4 mg Oral Daily   furosemide  20 mg Oral Daily   lacosamide  50 mg Oral Q12H    levETIRAcetam  2,000 mg Oral BID   levothyroxine  100 mcg Oral Q0600   metoprolol tartrate  25 mg Oral BID   pantoprazole  40 mg Oral Daily   sodium chloride flush  3 mL Intravenous Q12H   traZODone  50 mg Oral QHS    PRN meds: acetaminophen **OR** acetaminophen, bisacodyl, lacosamide (VIMPAT) IV, lactulose, LORazepam, ondansetron **OR** ondansetron (ZOFRAN) IV   Antimicrobials: Anti-infectives (From admission, onward)    None       Objective: Vitals:   10/17/21 1040 10/17/21 1145  BP: 112/63 116/63  Pulse:  92  Resp:  16  Temp:  98.8 F (37.1 C)  SpO2:  100%    Intake/Output Summary (Last 24 hours) at 10/17/2021 1437 Last data filed at 10/17/2021 0740 Gross per 24 hour  Intake 417 ml  Output 200 ml  Net 217 ml    Filed Weights   10/15/21 0022  Weight: 66.5 kg   Weight change:  Body mass index is 22.29 kg/m.   Physical Exam: General exam: Pleasant, elderly Caucasian female.  Not inphysicaldistress Skin: No rashes, lesions or ulcers.  HEENT: Atraumatic, normocephalic, no obvious bleeding Lungs: Clear to auscultation bilaterally CVS: Regular rate and rhythm, no murmur GI/Abd soft, nontender, improved distention.  Bowel sound present CNS: Alert, awake, oriented x3. Psychiatry: Mood appropriate Extremities: Pedal edema 1+ bilaterally with chronic cellulitis  Data Review: I have personally reviewed the laboratory data and studies available.  F/u labs ordered Unresulted Labs (From admission, onward)     Start     Ordered   10/18/21 0500  Ammonia  Tomorrow morning,   R       Question:  Specimen collection method  Answer:  Lab=Lab collect   10/17/21 0737   10/18/21 0500  CBC with Differential/Platelet  Tomorrow morning,   R       Question:  Specimen collection method  Answer:  Lab=Lab collect   10/17/21 0738   10/18/21 0500  Comprehensive metabolic panel  Tomorrow morning,   R       Question:  Specimen collection method  Answer:  Lab=Lab collect   10/17/21  0738   10/14/21 2000  Resp Panel by RT-PCR (Flu A&B, Covid) Nasopharyngeal Swab  (Tier 2 - Symptomatic/asymptomatic)  Once,   STAT        10/14/21 2000            Signed, Terrilee Croak, MD [facility] 10/17/2021

## 2021-10-17 NOTE — Progress Notes (Signed)
Subjective: no further reports of seizure this morning.  ROS: negative except above  Examination  Vital signs in last 24 hours: Temp:  [97.4 F (36.3 C)-99.1 F (37.3 C)] 97.4 F (36.3 C) (01/01 0322) Pulse Rate:  [76-86] 76 (01/01 0322) Resp:  [15-18] 18 (01/01 0322) BP: (83-127)/(50-64) 127/63 (01/01 0322) SpO2:  [96 %-100 %] 100 % (01/01 0322)  General: sitting up in bed, NAD CVS: pulse-normal rate and rhythm RS: breathing comfortably, CTAB Extremities: warm, BL LE edema Neuro: alert and oriented husband at bedside says she is back to normal. PERRLA, no apparent facial asymmetry, left upper extremity weakness is improving and strength in right upper extremity and bilateral lower extremities have antigravity strength R>L. There was no apparent clinical twitching.  Basic Metabolic Panel: Recent Labs  Lab 10/14/21 1153 10/15/21 0232 10/16/21 0148 10/17/21 0214  NA 139 136 134* 136  K 4.0 4.6 3.8 3.7  CL 102 100 99 101  CO2 28 30 27 26   GLUCOSE 90 82 84 94  BUN 27* 28* 21 26*  CREATININE 0.67 0.66 0.67 0.82  CALCIUM 9.5 8.8* 8.4* 8.1*   CBC: Recent Labs  Lab 10/14/21 1153 10/15/21 0232 10/16/21 0148 10/17/21 0214  WBC 8.2 7.0 5.1 6.4  NEUTROABS 5.7  --   --   --   HGB 12.0 11.3* 11.5* 10.7*  HCT 35.8* 34.5* 33.3* 31.1*  MCV 107.5* 108.2* 104.4* 104.4*  PLT 92* 59* 51* 61*   Coagulation Studies: No results for input(s): LABPROT, INR in the last 72 hours.  Imaging  MRI brain without contrast 10/14/2021: 1. Continued progression of extensive white matter vasogenic edema in the high right frontal lobe and portions of the right parietal lobe. PET scan 09/15/2021 demonstrated no increased activity relative to the adjacent areas in suggest tumor necrosis. Progressive white matter changes may reflect reactive disease rather than metastatic progression. The diffuse white matter progression is likely associated with the seizure activity. 2. Stable areas of cortical  susceptibility in the right frontal and parietal lobe corresponding with the areas of enhancement. These represent areas of treated metastases demonstrating calcification on the CT scan. 3. Stable focal T2 signal at the right temporal tip. 4. No new foci of susceptibility to suggest metastatic disease. 5. Extensive osseous metastases.   LTM 10/15/2021 to 10/16/2021:  No definite seizures were seen during this study.  Epileptogenicity and cortical dysfunction arising from right posterior quadrant.   EKG 10/15/2021: PR interval 115ms  MRI brain 10/16/2021 reviewed and showed continued progression of extensive white matter vasogenic edema in the high right frontal lobe and portions of the right parietal lobe. PET scan 09/15/2021 demonstrated no increased activity relative to the adjacent areas in suggest tumor necrosis. Progressive white matter changes may reflect reactive disease rather than metastatic progression. The diffuse white matter progression is likely associated with the seizure activity. Stable areas of cortical susceptibility in the right frontal and parietal lobe corresponding with the areas of enhancement. These represent areas of treated metastases demonstrating calcification on the CT scan. Stable focal T2 signal at the right temporal tip. No new foci of susceptibility to suggest metastatic disease. Extensive osseous metastases.  MRI brain 10/17/2021 reviewed and showed high right cerebral lesions with overlying dural thickening that mildly progressed from most recent postcontrast comparison 09/03/2021.  ASSESSMENT AND PLAN: 71 year old female with metastatic brain disease presenting with recurrent seizures while on Keppra 2000mg  twice daily and lacosamide 50mg  twice daily started. Her encephalopathy has improved significantly. We discussed the  MRI results with her and the family.  Focal convulsive status epilepticus - resolved. Acute encephalopathy - resolved.    Recommendations -She would benefit from physical therapy. -Continue lacosamide 50mg  twice daily if there are breakthrough  seizures increase to 100mg  two times daily and call neurology. -Continue Keppra 2000 mg twice daily. -Continue dexamethasone 4 mg daily. -Continue seizure precautions. -Consider prescription for Nayzilam for seizure rescue on discharge.  -Management of rest of comorbidities per primary team. -Please call for questions.  Electronically signed by:  Lynnae Sandhoff, MD Page: [phone] 10/17/2021, 7:47 AM

## 2021-10-17 NOTE — Plan of Care (Signed)
°  Problem: Education: Goal: Expressions of having a comfortable level of knowledge regarding the disease process will increase Outcome: Progressing   Problem: Coping: Goal: Ability to adjust to condition or change in health will improve Outcome: Progressing   Problem: Health Behavior/Discharge Planning: Goal: Compliance with prescribed medication regimen will improve Outcome: Progressing   Problem: Medication: Goal: Risk for medication side effects will decrease Outcome: Progressing   Problem: Clinical Measurements: Goal: Complications related to the disease process, condition or treatment will be avoided or minimized Outcome: Progressing   Problem: Safety: Goal: Verbalization of understanding the information provided will improve Outcome: Progressing   

## 2021-10-17 NOTE — Progress Notes (Signed)
I reached out to MRI about pt's ETA to lab. No spot at time of contact.

## 2021-10-18 ENCOUNTER — Other Ambulatory Visit (HOSPITAL_COMMUNITY): Payer: Self-pay

## 2021-10-18 LAB — COMPREHENSIVE METABOLIC PANEL
ALT: 83 U/L — ABNORMAL HIGH (ref 0–44)
AST: 124 U/L — ABNORMAL HIGH (ref 15–41)
Albumin: 2.5 g/dL — ABNORMAL LOW (ref 3.5–5.0)
Alkaline Phosphatase: 375 U/L — ABNORMAL HIGH (ref 38–126)
Anion gap: 9 (ref 5–15)
BUN: 24 mg/dL — ABNORMAL HIGH (ref 8–23)
CO2: 27 mmol/L (ref 22–32)
Calcium: 8.3 mg/dL — ABNORMAL LOW (ref 8.9–10.3)
Chloride: 101 mmol/L (ref 98–111)
Creatinine, Ser: 0.63 mg/dL (ref 0.44–1.00)
GFR, Estimated: >60 mL/min (ref >60)
Glucose, Bld: 97 mg/dL (ref 70–99)
Potassium: 4 mmol/L (ref 3.5–5.1)
Sodium: 137 mmol/L (ref 135–145)
Total Bilirubin: 1 mg/dL (ref 0.3–1.2)
Total Protein: 5.4 g/dL — ABNORMAL LOW (ref 6.5–8.1)

## 2021-10-18 LAB — CBC WITH DIFFERENTIAL/PLATELET
Abs Immature Granulocytes: 0.2 10*3/uL — ABNORMAL HIGH (ref 0.00–0.07)
Basophils Absolute: 0 10*3/uL (ref 0.0–0.1)
Basophils Relative: 0 %
Eosinophils Absolute: 0 10*3/uL (ref 0.0–0.5)
Eosinophils Relative: 0 %
HCT: 33.9 % — ABNORMAL LOW (ref 36.0–46.0)
Hemoglobin: 11.4 g/dL — ABNORMAL LOW (ref 12.0–15.0)
Immature Granulocytes: 4 %
Lymphocytes Relative: 25 %
Lymphs Abs: 1.4 10*3/uL (ref 0.7–4.0)
MCH: 35.6 pg — ABNORMAL HIGH (ref 26.0–34.0)
MCHC: 33.6 g/dL (ref 30.0–36.0)
MCV: 105.9 fL — ABNORMAL HIGH (ref 80.0–100.0)
Monocytes Absolute: 0.6 10*3/uL (ref 0.1–1.0)
Monocytes Relative: 10 %
Neutro Abs: 3.4 10*3/uL (ref 1.7–7.7)
Neutrophils Relative %: 61 %
Platelets: 51 10*3/uL — ABNORMAL LOW (ref 150–400)
RBC: 3.2 MIL/uL — ABNORMAL LOW (ref 3.87–5.11)
RDW: 14.1 % (ref 11.5–15.5)
WBC: 5.6 10*3/uL (ref 4.0–10.5)
nRBC: 0.4 % — ABNORMAL HIGH (ref 0.0–0.2)

## 2021-10-18 LAB — AMMONIA: Ammonia: 58 umol/L — ABNORMAL HIGH (ref 9–35)

## 2021-10-18 MED ORDER — METOPROLOL TARTRATE 12.5 MG HALF TABLET: 12.5000 mg | ORAL_TABLET | Freq: Two times a day (BID) | ORAL | Status: DC

## 2021-10-18 MED ORDER — SODIUM CHLORIDE 0.9 % IV SOLN: 2000.0000 mg | Freq: Two times a day (BID) | INTRAVENOUS | Status: DC

## 2021-10-18 MED ORDER — LACTULOSE 10 GM/15ML PO SOLN
10.0000 g | Freq: Two times a day (BID) | ORAL | Status: DC
  Administered 2021-10-18 – 2021-10-20 (×5): 10 g via ORAL
  Filled 2021-10-18 (×5): qty 30

## 2021-10-18 MED ORDER — METOPROLOL TARTRATE 12.5 MG HALF TABLET
12.5000 mg | ORAL_TABLET | Freq: Two times a day (BID) | ORAL | Status: DC
  Administered 2021-10-18 – 2021-10-19 (×3): 12.5 mg via ORAL
  Filled 2021-10-18 (×4): qty 1

## 2021-10-18 MED ORDER — LACTULOSE 10 GM/15ML PO SOLN: 10.0000 g | Freq: Two times a day (BID) | ORAL | Status: DC

## 2021-10-18 MED ORDER — LEVETIRACETAM 750 MG PO TABS
2000.0000 mg | ORAL_TABLET | Freq: Two times a day (BID) | ORAL | Status: DC
  Administered 2021-10-18 – 2021-10-20 (×4): 2000 mg via ORAL
  Filled 2021-10-18 (×4): qty 1

## 2021-10-18 NOTE — Progress Notes (Signed)
PROGRESS NOTE  [name] Riley  DOB: 1951-01-13  PCP: [name], MD ZSW:[id number]  DOA: 10/14/2021  LOS: 4 days  Hospital Day: 5  Chief Complaint: Increased left-sided weakness, confusion, left thumb twitching    Brief narrative: [name] Riley is a 71 y.o. 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 Patient presented to the ED on 12/29 at the direction of her neuro oncologist for evaluation of increased left-sided weakness, possible left hand twitching, and concern for Todd's paralysis versus occult status epilepticus.   Patient was recently hospitalized 12/19-12/20 for seizures with Todd's paralysis and was discharged on higher dose of Keppra and continued on Decadron.  She declined to go to SNF at that time and went home with her husband. At home, patient experienced progressive left-sided weakness to the point of inability to walk associated with increased leg swelling, confusion, hallucination.  She also had left arm twitching multiple times a day. On 12/29, she saw her neuro oncologist and was directed to the ED for further evaluation management   In the ED, patient was afebrile, hemodynamically stable, breathing on room air  Labs showed elevated LFTs MRI brain without contrast showed continued progression of extensive white matter vasogenic edema.   Admitted to hospitalist service  Neurology was consulted   Subjective: Patient was seen and examined this morning.  Propped up in bed.  Not in distress.  No new symptoms.  Not confused today.  No family at bedside.  Assessment/Plan: Progressive neurological symptoms in the setting of brain metastasis  -Presented with increased left sided weakness, confusion, and left thumb twitching concerning for occult seizures -Noted on exam to have left-sided deficits. -MRI brain showed continued progression of extensive white  matter festinate edema in the high right frontal lobe and portions of the right parietal lobe. 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  No definite seizure noted.  But it showed epileptogenicity and cortical dysfunction arising from right posterior quadrant likely secondary to underlying mass. -Per neurology recommendation, patient is currently on Keppra 2000 mg twice daily, Vimpat 50 mg twice daily. Continue seizure precautions. -Per neuro-oncology recommendation, to continue Decadron at 4 mg daily for now -Noted a plan from neuro-oncology to treat suspected inflammatory process with Avastin infusion scheduled for 10/26/2021 along with Enhertu.  Metastatic breast cancer  -left breast cancer (invasive ductal carcinoma, ER/PR +,HER2-) s/p mastectomy in 2013, history of chemotherapy and radiation therapy, local recurrence of breast cancer in 2017, breast cancer metastases to brain and several bones, s/p SRS in 2020, now on Enhertu    Elevated liver enzymes -unclear how much of work-up has been done for this as an outpatient.  High likelihood of association with malignancy.  -On clinical examination, noted that she has distended abdomen and bilateral lower extremity swelling.  Need to rule out portal hypotension and ascites. -Liver ultrasound on 12/30 showed increase in size of liver masses suggestive of worsening metastatic disease.  Also showed heterogeneous nodular liver worrisome for diffuse infiltrative disease including liver cirrhosis.  Ammonia level level was elevated.  Lactulose as needed was ordered.  Ammonia up today.  I will change lactulose to scheduled. Recent Labs  Lab 10/15/21 0232 10/16/21 0148 10/17/21 0214 10/18/21 0318  AST 119* 104* 108* 124*  ALT 87* 78* 74* 83*  ALKPHOS 383* 373* 363* 375*  BILITOT 1.0 1.0 1.0 1.0   PROT 5.5* 5.5* 5.0* 5.4*  ALBUMIN 2.7* 2.6* 2.5* 2.5*   Recent Labs  Lab 10/16/21 0148 10/18/21 0319  AMMONIA 47* 58*    Essential hypertension -Home meds include 100 mg daily and Lasix 20 mg daily. -Continue Lasix.  Metoprolol remains on hold.  Resume today.  CAD  -she reports hx of MI at age 48  -No current anginal complaints  -Statin on hold due to elevated LFTs. Not on antiplatelets. -Continue metoprolol.   Bilateral pedal edema -Pt complains of increased leg swelling over past couple weeks, no respiratory complaints or JVD, probably related to Decadron. -She had preserved EF on TTE from 09/27/21  -Albumin was 2.5 recently and she has been on Decadron  -Elevate legs, continue Lasix 20 mg daily   Thrombocytopenia  -Platelets 92k on admission.  Trending down.  No bleeding. Recent Labs  Lab 10/14/21 1153 10/15/21 0232 10/16/21 0148 10/17/21 0214 10/18/21 0318  PLT 92* 59* 51* 61* 51*   Hypothyroidism  -Continue Synthroid   Mobility: Needs PT eval once mental status improves Living condition: Home Goals of care:   Code Status: DNR.  Per H&P, this was confirmed by patient and husband at admission Nutritional status: Body mass index is 22.29 kg/m.      Diet:  Diet Order             Diet Heart Room service appropriate? Yes; Fluid consistency: Thin  Diet effective now                  DVT prophylaxis:  SCDs Start: 10/14/21 2046   Antimicrobials: None Fluid: None at this time Consultants: Neurology Family Communication: None at bedside  Status is: Inpatient  Continue in-hospital care because: CIR versus home health Level of care: Telemetry Medical   Dispo: The patient is from: Home              Anticipated d/c is to: CIR versus home health.  To be seen by PT this afternoon.              Patient currently is medically stable to d/c.   Difficult to place patient No     Infusions:   lacosamide (VIMPAT) IV     levETIRAcetam      Scheduled  Meds:  Chlorhexidine Gluconate Cloth  6 each Topical Daily   dexamethasone  4 mg Oral Daily   furosemide  20 mg Oral Daily   lacosamide  50 mg Oral Q12H   lactulose  10 g Oral BID   levETIRAcetam  2,000 mg Oral BID   levothyroxine  100 mcg Oral Q0600   metoprolol tartrate  12.5 mg Oral BID   pantoprazole  40 mg Oral Daily   sodium chloride flush  3 mL Intravenous Q12H   traZODone  50 mg Oral QHS    PRN meds: acetaminophen **OR** acetaminophen, bisacodyl, lacosamide (VIMPAT) IV, LORazepam, ondansetron **OR** ondansetron (ZOFRAN) IV   Antimicrobials: Anti-infectives (From admission, onward)    None       Objective: Vitals:   10/18/21 1159 10/18/21 1220  BP:  104/60  Pulse: 97 94  Resp:  18  Temp:  98.5 F (36.9 C)  SpO2:  98%    Intake/Output Summary (Last 24 hours) at 10/18/2021 1314 Last data filed at 10/17/2021 2253 Gross per 24 hour  Intake --  Output 750 ml  Net -750 ml   [employer]  10/15/21 0022  Weight: 66.5 kg   Weight change:  Body mass index is 22.29 kg/m.   Physical Exam: General exam: Pleasant, elderly Caucasian female. Not in physical distress Skin: No rashes, lesions or ulcers. HEENT: Atraumatic, normocephalic, no obvious bleeding Lungs: Clear to auscultation bilaterally CVS: Regular rate and rhythm, no murmur GI/Abd soft, nontender, improved distention.  Bowel sound present CNS: Alert, awake, oriented x3. Psychiatry: Mood appropriate Extremities: Pedal edema improving bilaterally with chronic cellulitis  Data Review: I have personally reviewed the laboratory data and studies available.  F/u labs ordered Unresulted Labs (From admission, onward)     Start     Ordered   10/14/21 2000  Resp Panel by RT-PCR (Flu A&B, Covid) Nasopharyngeal Swab  (Tier 2 - Symptomatic/asymptomatic)  Once,   STAT        10/14/21 2000            Signed, Terrilee Croak, MD [facility] 10/18/2021

## 2021-10-18 NOTE — Progress Notes (Signed)
Physical Therapy Treatment Patient Details Name: [name] Riley MRN: [id number] DOB: December 09, 1950 Today's Date: 10/18/2021   History of Present Illness Pt is a 71 y/o female who presents with recurrent seizures while on Keppra and progressive weakening of L side. Recent admission at [facility] for seizures. PMH significant for breast cancer with metastases to brain, bone and liver, HTN, CAD, hypothyroidism.    PT Comments    Pt progressing towards physical therapy goals. Was able to achieve OOB to chair this session with the [facility] and +2 assist for balance support and safety. Pt fatigues quickly with activity, and is questionable whether this patient will be able to tolerate the increased intensity of therapies at the AIR level. However, feel the patient will get the most benefit from AIR to decrease caregiver burden and maximize functional independence and safety for eventual return home with family support. Will continue to follow and progress as able per POC.     Recommendations for follow up therapy are one component of a multi-disciplinary discharge planning process, led by the attending physician.  Recommendations may be updated based on patient status, additional functional criteria and insurance authorization.  Follow Up Recommendations  Acute inpatient rehab (3hours/day)     Assistance Recommended at Discharge Frequent or constant Supervision/Assistance  Equipment Recommendations  Other (comment) (TBD by next venue of care)    Recommendations for Other Services Rehab consult     Precautions / Restrictions Precautions Precautions: Fall Precaution Comments: L side weakness Restrictions Weight Bearing Restrictions: No     Mobility  Bed Mobility Overal bed mobility: Needs Assistance Bed Mobility: Supine to Sit     Supine to sit: Mod assist     General bed mobility comments: Assist for LLE flexion and LUE reach for roll to the R. Bed pad utilized for full roll and to control trunk  into sitting.    Transfers Overall transfer level: Needs assistance Equipment used: Ambulation equipment used Transfers: Sit to/from Stand;Bed to chair/wheelchair/BSC Sit to Stand: +2 physical assistance;From elevated surface;Mod assist;Max assist           General transfer comment: Stedy utilized for bed>chair this session. Pt required +2 assist for power up to stand, and increased assist at the end to extend hips fully to get flaps of the Stedy in position for pt to sit. L lateral lean in sitting and standing required assist from therapist to hold midline posture during move to the chair. Transfer via Lift Equipment: Stedy  Ambulation/Gait               General Gait Details: Unable to progress to ambulation this session.   Stairs             Wheelchair Mobility    Modified Rankin (Stroke Patients Only)       Balance Overall balance assessment: Needs assistance Sitting-balance support: Feet supported;No upper extremity supported Sitting balance-Leahy Scale: Poor   Postural control: Left lateral lean;Posterior lean Standing balance support: Bilateral upper extremity supported;Reliant on assistive device for balance Standing balance-Leahy Scale: Zero Standing balance comment: max assist required                            Cognition Arousal/Alertness: Awake/alert Behavior During Therapy: Flat affect Overall Cognitive Status: Impaired/Different from baseline Area of Impairment: Orientation;Memory;Following commands;Safety/judgement;Awareness;Problem solving                 Orientation Level: Disoriented to;Time   Memory: Decreased  short-term memory Following Commands: Follows one step commands consistently;Follows one step commands with increased time;Follows multi-step commands inconsistently Safety/Judgement: Decreased awareness of deficits;Decreased awareness of safety Awareness: Intellectual Problem Solving: Slow processing;Decreased  initiation;Difficulty sequencing;Requires verbal cues;Requires tactile cues General Comments: Pt confused at times about situation and need for rehab. Reports she is going home tomorrow and her hair Hansel Feinstein is coming to the house to do her hair.        Exercises      General Comments        Pertinent Vitals/Pain Pain Assessment: No/denies pain    Home Living                          Prior Function            PT Goals (current goals can now be found in the care plan section) Acute Rehab PT Goals Patient Stated Goal: return home with family to assist PT Goal Formulation: With patient Time For Goal Achievement: 10/30/21 Potential to Achieve Goals: Fair Progress towards PT goals: Progressing toward goals    Frequency    Min 3X/week      PT Plan Current plan remains appropriate    Co-evaluation              AM-PAC PT "6 Clicks" Mobility   Outcome Measure  Help needed turning from your back to your side while in a flat bed without using bedrails?: A Lot Help needed moving from lying on your back to sitting on the side of a flat bed without using bedrails?: A Lot Help needed moving to and from a bed to a chair (including a wheelchair)?: A Lot Help needed standing up from a chair using your arms (e.g., wheelchair or bedside chair)?: Total Help needed to walk in hospital room?: Total Help needed climbing 3-5 steps with a railing? : Total 6 Click Score: 9    End of Session Equipment Utilized During Treatment: Gait belt Activity Tolerance: Patient tolerated treatment well;Patient limited by fatigue Patient left: in bed;with call bell/phone within reach Nurse Communication: Mobility status;Need for lift equipment PT Visit Diagnosis: Unsteadiness on feet (R26.81);Other abnormalities of gait and mobility (R26.89);Muscle weakness (generalized) (M62.81)     Time: 4680-3212 PT Time Calculation (min) (ACUTE ONLY): 30 min  Charges:  $Therapeutic Activity:  23-37 mins                     Rolinda Roan, PT, DPT Acute Rehabilitation Services Pager: [phone] Office: [phone]    Thelma Comp 10/18/2021, 2:31 PM

## 2021-10-18 NOTE — Care Management Important Message (Signed)
Important Message  Patient Details  Name: [name] Riley MRN: [id number] Date of Birth: 08-18-1951   Medicare Important Message Given:  Yes     [name] Montine Circle 10/18/2021, 3:04 PM

## 2021-10-19 ENCOUNTER — Telehealth: Payer: Self-pay

## 2021-10-19 ENCOUNTER — Encounter: Payer: Self-pay | Admitting: Internal Medicine

## 2021-10-19 ENCOUNTER — Other Ambulatory Visit (HOSPITAL_COMMUNITY): Payer: Self-pay

## 2021-10-19 ENCOUNTER — Encounter: Payer: Self-pay | Admitting: Hematology and Oncology

## 2021-10-19 MED ORDER — OXYCODONE-ACETAMINOPHEN 5-325 MG PO TABS
1.0000 | ORAL_TABLET | Freq: Four times a day (QID) | ORAL | Status: DC | PRN
Start: 2021-10-19 — End: 2021-10-20
  Administered 2021-10-19 – 2021-10-20 (×2): 1 via ORAL
  Filled 2021-10-19 (×3): qty 1

## 2021-10-19 MED ORDER — MORPHINE SULFATE (PF) 2 MG/ML IV SOLN: 2.0000 mg | INTRAVENOUS | Status: DC | PRN

## 2021-10-19 NOTE — Progress Notes (Addendum)
PT Cancellation Note  Patient Details Name: [name] Riley MRN: [id number] DOB: November 07, 1950   Cancelled Treatment:    Reason Eval/Treat Not Completed: Patient at procedure or test/unavailable. PT attempted x 2. NT assisting with bath on first attempt. Pt eating lunch on second attempt. Pt reports doing therapy right after she eats typically makes her nauseated. PT to re-attempt as time allows.   [name] 10/19/2021, 11:59 AM  [name], PT  Office # [phone] Pager [phone]

## 2021-10-19 NOTE — PMR Pre-admission (Signed)
PMR Admission Coordinator Pre-Admission Assessment  Patient: [name] Riley is an 71 y.o., female MRN: [id number] DOB: [birth year] Height: _0  (172.7 cm) Weight: 66.5 kg  Insurance Information HMO:     PPO:      PCP:      IPA:      80/20:      OTHER:  PRIMARY: Medicare A/B      Policy#: [id number]      Subscriber: pt CM Name:       Phone#:      Fax#:  Pre-Cert#: verified Civil engineer, contracting:  Benefits:  Phone #:      Name:  Eff. Date: A and B 10/17/17     Deduct: $1600      Out of Pocket Max: n/a      Life Max: n/a CIR: 100%      SNF: 20 full days Outpatient: 80%     Co-Ins: 20% Home Health: 100%      Co-Pay: DME: 80%     Co-Ins: 20% Providers:  SECONDARY: Engineer, production Supplement      Policy#:[id number]      Phone#:   Development worker, community:       Phone#:   The Actuary for patients in Inpatient Rehabilitation Facilities with attached Privacy Act [name] Riley was provided and verbally reviewed with: Patient and Family  Emergency Contact Information Contact Information     Name Relation Home Work Mobile   [name] Riley Spouse   Yell Daughter   [phone]   [name] Riley, [name] Riley [phone]       Current Medical History  Patient Admitting Diagnosis: 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  Pt presented again to [name] ED on 12/29 at the direction of her Neuro-oncologist for evaluation of increased left-sided weakness, possible left hand twitching, and concern for Todd's paralysis versus occult status epilepticus.  Pt c/o 9 day history of progressive weakness, LE swelling, confusion, and hallucinations.  IN ED pt afebrile, hemodynamically stable, and on room air.  Labs  showed elevated LFTs.  MRI brain w/o contrast showed continued progression of extensive white matter vasogenic edema in right frontal lobe and portions of right parietal lobe.  Neurology was consulted and recommended long term EEG which showed no seizures, but epileptogenicity and cortical dysfunction arising from right posterior quadrant likely 2/2 underlying mass.  Also recommended Keppra 2000 mg BID, Vimpat 50 mg BID, and seizure precautions.  Neuro-oncology recommended continue decadron 52m QD.  Dr. GLindi Adieand Dr. VMickeal Skinnerboth consulted and report okay to hold chemotherapy (originally scheduled for 1/10) until after rehab completed.  Therapy ongoing and pt recommended for CIR.     [facility] admission coordinator and physician.  Past Medical History  Past Medical History:  Diagnosis Date   Allergy    [place] [place]   L , invasive ductal carcinoma, ER/PR +,HER2 -   [place]    [place] cancer   Coronary artery disease 2001   Heart attack ([facility] 09/2000   Sep 25, 2000  --no intervention   History of chemotherapy comp. 08/22/2012   4 cycles of FEC and $ cycles of Taxotere  Hyperlipidemia    Hypertension    Hypothyroidism    PONV (postoperative nausea and vomiting)    gets sick from anesthesia   Seizures [facility])    Status post radiation therapy 07/09/12 - 08/22/2012   Left Breast, 60.4 gray    Has the patient had major surgery during 100 days prior to admission? No  Family History   family history includes Cancer in her cousin, paternal aunt, paternal grandfather, and paternal uncle; Cancer (age of onset: 82) in her father; Diabetes in her maternal grandmother; Hypertension in her maternal grandmother and mother.  Current Medications  Current Facility-Administered Medications:    acetaminophen (TYLENOL) tablet 325 mg, 325 mg, Oral, Q6H PRN, 325 mg at 10/15/21 2252 **OR** acetaminophen (TYLENOL)  suppository 325 mg, 325 mg, Rectal, Q6H PRN, Opyd, Ilene Qua, MD   bisacodyl (DULCOLAX) EC tablet 5 mg, 5 mg, Oral, Daily PRN, Opyd, Ilene Qua, MD, 5 mg at 10/16/21 1106   Chlorhexidine Gluconate Cloth 2 % PADS 6 each, 6 each, Topical, Daily, Dahal, Binaya, MD, 6 each at 10/19/21 0845   dexamethasone (DECADRON) tablet 4 mg, 4 mg, Oral, Daily, Opyd, Ilene Qua, MD, 4 mg at 10/19/21 0827   furosemide (LASIX) tablet 20 mg, 20 mg, Oral, Daily, Opyd, Ilene Qua, MD, 20 mg at 10/19/21 0827   lacosamide (VIMPAT) 50 mg in sodium chloride 0.9 % 25 mL IVPB, 50 mg, Intravenous, Q12H PRN, Dahal, Marlowe Aschoff, MD   [DISCONTINUED] lacosamide (VIMPAT) 50 mg in sodium chloride 0.9 % 25 mL IVPB, 50 mg, Intravenous, Q12H **OR** lacosamide (VIMPAT) tablet 50 mg, 50 mg, Oral, Q12H, Lora Havens, MD, 50 mg at 10/19/21 0827   lactulose ([place]) 10 GM/15ML solution 10 g, 10 g, Oral, BID, Dahal, Binaya, MD, 10 g at 10/19/21 0827   levETIRAcetam (KEPPRA) tablet 2,000 mg, 2,000 mg, Oral, BID, 2,000 mg at 10/19/21 0826 **OR** levETIRAcetam (KEPPRA) 2,000 mg in sodium chloride 0.9 % 250 mL IVPB, 2,000 mg, Intravenous, BID, Dahal, Binaya, MD   levothyroxine (SYNTHROID) tablet 100 mcg, 100 mcg, Oral, Q0600, Opyd, Ilene Qua, MD, 100 mcg at 10/19/21 0606   LORazepam (ATIVAN) injection 2 mg, 2 mg, Intravenous, Q4H PRN, Lora Havens, MD, 2 mg at 10/15/21 0742   metoprolol tartrate (LOPRESSOR) tablet 12.5 mg, 12.5 mg, Oral, BID, Dahal, Binaya, MD, 12.5 mg at 10/19/21 0827   ondansetron (ZOFRAN) tablet 4 mg, 4 mg, Oral, Q6H PRN **OR** ondansetron (ZOFRAN) injection 4 mg, 4 mg, Intravenous, Q6H PRN, Opyd, Ilene Qua, MD   pantoprazole (PROTONIX) EC tablet 40 mg, 40 mg, Oral, Daily, Opyd, Timothy S, MD, 40 mg at 10/19/21 0827   sodium chloride flush (NS) 0.9 % injection 3 mL, 3 mL, Intravenous, Q12H, Opyd, Ilene Qua, MD, 3 mL at 10/19/21 0828   traZODone (DESYREL) tablet 50 mg, 50 mg, Oral, QHS, Opyd, Ilene Qua, MD, 50 mg at 10/18/21  2239  Patients Current Diet:  Diet Order             Diet Heart Room service appropriate? Yes; Fluid consistency: Thin  Diet effective now                   Precautions / Restrictions Precautions Precautions: Fall Precaution Comments: L side weakness Restrictions Weight Bearing Restrictions: No   Has the patient had 2 or more falls or a fall with injury in the past year? Yes  Prior Activity Level Household: very limited, especially in the last few weeks, requiring assist for all mobility and  ADLs (likely min to mod), w/c level in household with spouse providing assist  Prior Functional Level Self Care: Did the patient need help bathing, dressing, using the toilet or eating? Needed some help  Indoor Mobility: Did the patient need assistance with walking from room to room (with or without device)? Needed some help  Stairs: Did the patient need assistance with internal or external stairs (with or without device)? Needed some help  Functional Cognition: Did the patient need help planning regular tasks such as shopping or remembering to take medications? Needed some help  Patient Information Are you of Hispanic, Latino/a,or Spanish origin?: A. No, not of Hispanic, Latino/a, or Spanish origin What is your race?: A. White Do you need or want an interpreter to communicate with a doctor or health care staff?: 0. No  Patient's Response To:  Health Literacy and Transportation Is the patient able to respond to health literacy and transportation needs?: Yes Health Literacy - How often do you need to have someone help you when you read instructions, pamphlets, or other written material from your doctor or pharmacy?: Sometimes In the past 12 months, has lack of transportation kept you from medical appointments or from getting medications?: No In the past 12 months, has lack of transportation kept you from meetings, work, or from getting things needed for daily living?: No  Water quality scientist / [place] Devices/Equipment: Grab bars around toilet, Wheelchair, Bedside commode/3-in-1 Home Equipment: Wheelchair - manual, BSC/3in1, Conservation officer, nature (2 wheels) (gait belt)  Prior Device Use: Indicate devices/aids used by the patient prior to current illness, exacerbation or injury? Manual wheelchair and Walker  Current Functional Level Cognition  Overall Cognitive Status: Impaired/Different from baseline Orientation Level: Oriented X4 Following Commands: Follows one step commands consistently, Follows one step commands with increased time, Follows multi-step commands inconsistently Safety/Judgement: Decreased awareness of deficits, Decreased awareness of safety General Comments: Pt confused at times about situation and need for rehab. Reports she is going home tomorrow and her hair Hansel Feinstein is coming to the house to do her hair.    Extremity Assessment (includes Sensation/Coordination)  Upper Extremity Assessment: LUE deficits/detail LUE Deficits / Details: Able to grip with increased time to initiate. 2/5 strength for shoulder flexion, and 1/5 strength for wrist flexion/extensiono, elbow flexion/extension. LUE Sensation: decreased proprioception, decreased light touch LUE Coordination: decreased gross motor, decreased fine motor  Lower Extremity Assessment: LLE deficits/detail LLE Deficits / Details: Grossly 2/5 strength in quads, hip flexors, ankle dorsiflexors, hamstrings. LLE Sensation: decreased proprioception, decreased light touch LLE Coordination: decreased gross motor, decreased fine motor    ADLs       Mobility  Overal bed mobility: Needs Assistance Bed Mobility: Supine to Sit Supine to sit: Mod assist Sit to supine: Max assist General bed mobility comments: Assist for LLE flexion and LUE reach for roll to the R. Bed pad utilized for full roll and to control trunk into sitting.    Transfers  Overall transfer level: Needs  assistance Equipment used: Ambulation equipment used Transfers: Sit to/from Stand, Bed to chair/wheelchair/BSC Sit to Stand: +2 physical assistance, From elevated surface, Mod assist, Max assist Bed to/from chair/wheelchair/BSC transfer type:: Via Lift equipment  Lateral/Scoot Transfers: Mod assist, +2 physical assistance Transfer via Lift Equipment: Stedy General transfer comment: Stedy utilized for bed>chair this session. Pt required +2 assist for power up to stand, and increased assist at the end to extend hips fully to get flaps of the Stedy in position for pt to sit.  L lateral lean in sitting and standing required assist from therapist to hold midline posture during move to the chair.    Ambulation / Gait / Stairs / Wheelchair Mobility  Ambulation/Gait General Gait Details: Unable to progress to ambulation this session.    Posture / Balance Dynamic Sitting Balance Sitting balance - Comments: approaching fair at EOB. Increased time to gain/maintain sitting balance. Balance Overall balance assessment: Needs assistance Sitting-balance support: Feet supported, No upper extremity supported Sitting balance-Leahy Scale: Poor Sitting balance - Comments: approaching fair at EOB. Increased time to gain/maintain sitting balance. Postural control: Left lateral lean, Posterior lean Standing balance support: Bilateral upper extremity supported, Reliant on assistive device for balance Standing balance-Leahy Scale: Zero Standing balance comment: max assist required    Special needs/care consideration Skin generalized ecchymosis and Special service needs follow with Dr. Jeanine Luz and Dr. Mickeal Skinner   Previous Home Environment (from acute therapy documentation) Living Arrangements: Spouse/significant other Available Help at Discharge: Family, Available 24 hours/day Type of Home: House Home Layout: Two level, Able to live on main level with bedroom/bathroom, Full bath on main level Alternate Level  Stairs-Rails: Right, Left, Can reach both Alternate Level Stairs-Number of Steps: 8 landing + 8 Home Access: Stairs to enter Entrance Stairs-Rails: Right, Left, Can reach both Entrance Stairs-Number of Steps: 4 Bathroom Shower/Tub: Multimedia programmer: Associate Professor Accessibility: Yes Home Care Services: No  Discharge Living Setting Plans for Discharge Living Setting: Patient's home, Lives with (comment) (spouse) Type of Home at Discharge: House Discharge Home Layout: Able to live on main level with bedroom/bathroom Discharge Home Access: Stairs to enter Entrance Stairs-Rails: Can reach both Entrance Stairs-Number of Steps: 4 Discharge Bathroom Shower/Tub: Walk-in shower Discharge Bathroom Toilet: Standard Discharge Bathroom Accessibility: Yes How Accessible: Accessible via wheelchair Does the patient have any problems obtaining your medications?: No  Social/Family/Support Systems Patient Roles: Spouse Anticipated Caregiver: spouse, Christianne Zacher Anticipated Caregiver's Contact Information: [phone] Ability/Limitations of Caregiver: n/a Caregiver Availability: 24/7 Discharge Plan Discussed with Primary Caregiver: Yes Is Caregiver In Agreement with Plan?: Yes Does Caregiver/Family have Issues with Lodging/Transportation while Pt is in Rehab?: No  Goals Patient/Family Goal for Rehab: PT/OT min assist, SLP supervision Expected length of stay: 12-14 Additional Information: plan to hold chemo treatments until after rehab discharge Pt/Family Agrees to Admission and willing to participate: Yes Program Orientation Provided & Reviewed with Pt/Caregiver Including Roles  & Responsibilities: Yes Additional Information Needs: oncology team: Dr. Lindi Adie and Dr. Mickeal Skinner  Barriers to Discharge: Pending chemo/radiation, Medical stability  Barriers to Discharge Comments: 1/10 chemo on hold, next scheduled is 1/31  Decrease burden of Care through IP rehab admission:  n/a  Possible need for SNF placement upon discharge: not anticipated  Patient Condition: I have reviewed medical [facility] from [facility], spoken with  [facility] team , and patient and spouse. I met with patient at the bedside for inpatient rehabilitation assessment.  Patient will benefit from ongoing PT, OT, and SLP, can actively participate in 3 hours of therapy a day 5 days of the week, and can make measurable gains during the admission.  Patient will also benefit from the coordinated team approach during an Inpatient Acute Rehabilitation admission.  The patient will receive intensive therapy as well as Rehabilitation physician, nursing, social worker, and care management interventions.  Due to bladder management, bowel management, safety, skin/wound care, disease management, medication administration, pain management, and patient education the patient requires 24 hour a day rehabilitation nursing.  The patient is currently mod/max +2  with mobility and basic ADLs.  Discharge setting and therapy post discharge at home with home health is anticipated.  Patient has agreed to participate in the Acute Inpatient Rehabilitation Program and will admit today.  Preadmission Screen Completed By:  Michel Santee, PT, DPT 10/19/2021 1:09 PM ______________________________________________________________________   Discussed status with Dr. Ranell Patrick on 10/20/21  at 10:31 AM  and received approval for admission today.  Admission Coordinator:  Michel Santee, PT, DPT time 10:31 AM Sudie Grumbling 10/20/21    Assessment/Plan: Diagnosis: Metastatic breast cancer with breakthrough seizures Does the need for close, 24 hr/day Medical supervision in concert with the patient's rehab needs make it unreasonable for this patient to be served in a less intensive setting? Yes Co-Morbidities requiring supervision/potential complications: hypothyroidism, essential hypertension, CAD without angina, thrombocytopenia, hyperlipidemia Due to bladder  management, bowel management, safety, skin/wound care, disease management, medication administration, pain management, and patient education, does the patient require 24 hr/day rehab nursing? Yes Does the patient require coordinated care of a physician, rehab nurse, PT, OT, and SLP to address physical and functional deficits in the context of the above medical diagnosis(es)? Yes Addressing deficits in the following areas: balance, endurance, locomotion, strength, transferring, bowel/bladder control, bathing, dressing, feeding, grooming, toileting, cognition, and psychosocial support Can the patient actively participate in an intensive therapy program of at least 3 hrs of therapy 5 days a week? Yes The potential for patient to make measurable gains while on inpatient rehab is excellent Anticipated functional outcomes upon discharge from inpatient rehab: min assist PT, supervision OT, independent SLP Estimated rehab length of stay to reach the above functional goals is: 14-21 days Anticipated discharge destination: Home 10. Overall Rehab/Functional Prognosis: excellent   MD Signature: Leeroy Cha, MD

## 2021-10-19 NOTE — Telephone Encounter (Signed)
Oral Oncology Patient Advocate Encounter   Was successful in securing patient a $[id number] grant from [facility] [place] (PAF) to provide copayment coverage for Verzenio.  This will keep the out of pocket expense at $0.     I have spoken with the patient.    The billing information is as follows and has been shared with [place]: [id number] PCN:  [id number] Member ID: [id number] Group ID: [id number] Dates of Eligibility: 10/19/21 through 10/20/22

## 2021-10-19 NOTE — Consult Note (Addendum)
° °  [facility] Inpatient Consult   10/19/2021  [name] Riley February 03, 1951 [id number]  [facility] [ACO] Patient: Medicare  Primary Care Provider:  [name], MD, [place],  is an embedded provider with a Chronic Care Management team and program, and is listed for the transition of care follow up and appointments.  Patient was screened for Embedded practice service needs for chronic care management  Plan: A referral can be sent to the [place] [facility] for CCM if patient needs this for for post hospital transition.  Please contact for further questions,  Natividad Brood, RN BSN [facility] Liaison  [phone] business mobile phone Toll free office [phone]  Fax number: [phone] [email or web address]

## 2021-10-19 NOTE — Progress Notes (Signed)
PROGRESS NOTE  [name] Riley  DOB: Mar 23, 1951  PCP: [name], MD GBT:[id number]  DOA: 10/14/2021  LOS: 5 days  Hospital Day: 6  Chief Complaint: Increased left-sided weakness, confusion, left thumb twitching    Brief narrative: [name] Riley is a 71 y.o. 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 Patient presented to the ED on 12/29 at the direction of her neuro oncologist for evaluation of increased left-sided weakness, possible left hand twitching, and concern for Todd's paralysis versus occult status epilepticus.   Patient was recently hospitalized 12/19-12/20 for seizures with Todd's paralysis and was discharged on higher dose of Keppra and continued on Decadron.  She declined to go to SNF at that time and went home with her husband. At home, patient experienced progressive left-sided weakness to the point of inability to walk associated with increased leg swelling, confusion, hallucination.  She also had left arm twitching multiple times a day. On 12/29, she saw her neuro oncologist and was directed to the ED for further evaluation management   In the ED, patient was afebrile, hemodynamically stable, breathing on room air  Labs showed elevated LFTs MRI brain without contrast showed continued progression of extensive white matter vasogenic edema.   Admitted to hospitalist service  Neurology was consulted   Subjective: Patient was seen and examined this morning.  Propped up in bed.  Not in distress.  No new symptoms.  Not confused today.  No family at bedside. Currently pending CIR.  Assessment/Plan: Progressive neurological symptoms in the setting of brain metastasis  -Presented with increased left sided weakness, confusion, and left thumb twitching concerning for occult seizures -Noted on exam to have left-sided deficits. -MRI brain showed continued progression  of extensive white matter festinate edema in the high right frontal lobe and portions of the right parietal lobe. 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  No definite seizure noted.  But it showed epileptogenicity and cortical dysfunction arising from right posterior quadrant likely secondary to underlying mass. -Per neurology recommendation, patient is currently on Keppra 2000 mg twice daily, Vimpat 50 mg twice daily. Continue seizure precautions. -Per neuro-oncology recommendation, to continue Decadron at 4 mg daily for now -1/3, case was discussed with oncologist Dr. Lindi Adie and Dr. Mickeal Skinner.  Recommended to prioritize rehab before the next chemotherapy.  Metastatic breast cancer  -left breast cancer (invasive ductal carcinoma, ER/PR +,HER2-) s/p mastectomy in 2013, history of chemotherapy and radiation therapy, local recurrence of breast cancer in 2017, breast cancer metastases to brain and several bones, s/p SRS in 2020, now on Enhertu    Elevated liver enzymes -unclear how much of work-up has been done for this as an outpatient.  High likelihood of association with malignancy.  -Liver ultrasound on 12/30 showed increase in size of liver masses suggestive of worsening metastatic disease.  Also showed heterogeneous nodular liver worrisome for diffuse infiltrative disease including liver cirrhosis.  Ammonia level level was elevated.  Currently on scheduled lactulose.   Recent Labs  Lab 10/15/21 0232 10/16/21 0148 10/17/21 0214 10/18/21 0318  AST 119* 104* 108* 124*  ALT 87* 78* 74* 83*  ALKPHOS 383* 373* 363* 375*  BILITOT 1.0 1.0 1.0 1.0  PROT 5.5* 5.5* 5.0* 5.4*  ALBUMIN 2.7* 2.6* 2.5* 2.5*   Recent Labs  Lab  10/16/21 0148 10/18/21 0319  AMMONIA 47* 58*   Essential hypertension -Home meds include 100 mg daily and Lasix  20 mg daily. -Continue both.  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 Not on antiplatelets. -Continue metoprolol.   Bilateral pedal edema -Pt complains of increased leg swelling over past couple weeks, no respiratory complaints or JVD, probably related to Decadron. -She had preserved EF on TTE from 09/27/21  -Albumin was 2.5 recently and she has been on Decadron  -Elevate legs, continue Lasix 20 mg daily   Thrombocytopenia  -Platelets 92k on admission.  Trending down.  No bleeding. Recent Labs  Lab 10/14/21 1153 10/15/21 0232 10/16/21 0148 10/17/21 0214 10/18/21 0318  PLT 92* 59* 51* 61* 51*   Hypothyroidism  -Continue Synthroid   Mobility: PT evaluation appreciated Living condition: Home Goals of care:   Code Status: DNR.   Nutritional status: Body mass index is 22.29 kg/m.      Diet:  Diet Order             Diet Heart Room service appropriate? Yes; Fluid consistency: Thin  Diet effective now                  DVT prophylaxis:  SCDs Start: 10/14/21 2046   Antimicrobials: None Fluid: None at this time Consultants: Neurology Family Communication: None at bedside  Status is: Inpatient  Continue in-hospital care because: CIR versus home health Level of care: Telemetry Medical   Dispo: The patient is from: Home              Anticipated d/c is to: CIR versus home health.               Patient currently is medically stable to d/c.   Difficult to place patient No     Infusions:   lacosamide (VIMPAT) IV     levETIRAcetam      Scheduled Meds:  Chlorhexidine Gluconate Cloth  6 each Topical Daily   dexamethasone  4 mg Oral Daily   furosemide  20 mg Oral Daily   lacosamide  50 mg Oral Q12H   lactulose  10 g Oral BID   levETIRAcetam  2,000 mg Oral BID   levothyroxine  100 mcg Oral Q0600   metoprolol tartrate  12.5 mg Oral BID   pantoprazole  40 mg Oral Daily   sodium chloride  flush  3 mL Intravenous Q12H   traZODone  50 mg Oral QHS    PRN meds: acetaminophen **OR** acetaminophen, bisacodyl, lacosamide (VIMPAT) IV, LORazepam, ondansetron **OR** ondansetron (ZOFRAN) IV   Antimicrobials: Anti-infectives (From admission, onward)    None       Objective: Vitals:   10/19/21 0349 10/19/21 0810  BP: 122/72 122/74  Pulse: 73 77  Resp: (!) 22 15  Temp: 98.3 F (36.8 C) 98.7 F (37.1 C)  SpO2: 97% 99%    Intake/Output Summary (Last 24 hours) at 10/19/2021 1059 Last data filed at 10/19/2021 2952 Gross per 24 hour  Intake 774 ml  Output 600 ml  Net 174 ml   Filed Weights   10/15/21 0022  Weight: 66.5 kg   Weight change:  Body mass index is 22.29 kg/m.   Physical Exam: General exam: Pleasant, elderly Caucasian female. Not in physical distress Skin: No rashes, lesions or ulcers. HEENT: Atraumatic, normocephalic, no obvious bleeding Lungs: Clear to auscultation bilaterally CVS: Regular rate and rhythm,  no murmur GI/Abd soft, nontender, improved distention.  Bowel sound present CNS: Alert, awake, oriented x3. Psychiatry: Mood appropriate Extremities: Pedal edema improving bilaterally with chronic cellulitis  Data Review: I have personally reviewed the laboratory data and studies available.  F/u labs ordered Unresulted Labs (From admission, onward)     Start     Ordered   10/14/21 2000  Resp Panel by RT-PCR (Flu A&B, Covid) Nasopharyngeal Swab  (Tier 2 - Symptomatic/asymptomatic)  Once,   STAT        10/14/21 2000            Signed, Terrilee Croak, MD [facility] 10/19/2021

## 2021-10-19 NOTE — Progress Notes (Signed)
Inpatient Rehab Admissions Coordinator:   Met with patient and her husband at bedside to discuss CIR recommendations, goals, and expectations.  We discussed that ideally pt would be able to pause chemo treatments to prioritize rehab, and resume once discharged from CIR.  It appears Dr. Mickeal Skinner is okay with this plan.  Pt and spouse confirm that he was providing assist for most mobility and ADLs prior to admission and is prepared to continue this at discharge.  His goal is simply to get as much function back as possible, but recognizes she will likely continue to need assist, and may require more assist on bad days.  I reviewed Medicare benefits.  Will discuss with rehab MD to confirm they are okay with possible admit and f/u tomorrow.   Shann Medal, PT, DPT Admissions Coordinator [phone] 10/19/21  1:04 PM

## 2021-10-20 ENCOUNTER — Other Ambulatory Visit: Payer: Self-pay

## 2021-10-20 ENCOUNTER — Inpatient Hospital Stay (HOSPITAL_COMMUNITY)
Admission: RE | Admit: 2021-10-20 | Discharge: 2021-10-28 | DRG: 945 | Disposition: A | Discharge status: Hospice | Payer: Medicare Other | Source: Intra-hospital | Attending: Physical Medicine & Rehabilitation | Admitting: Physical Medicine & Rehabilitation

## 2021-10-20 ENCOUNTER — Inpatient Hospital Stay (HOSPITAL_COMMUNITY): Payer: Medicare Other

## 2021-10-20 ENCOUNTER — Encounter (HOSPITAL_COMMUNITY): Payer: Self-pay | Admitting: Physical Medicine & Rehabilitation

## 2021-10-20 ENCOUNTER — Other Ambulatory Visit (HOSPITAL_COMMUNITY): Payer: Self-pay

## 2021-10-20 DIAGNOSIS — B9620 Unspecified Escherichia coli [E. coli] as the cause of diseases classified elsewhere: Secondary | ICD-10-CM | POA: Diagnosis present

## 2021-10-20 DIAGNOSIS — R5381 Other malaise: Principal | ICD-10-CM | POA: Diagnosis present

## 2021-10-20 DIAGNOSIS — Z801 Family history of malignant neoplasm of trachea, bronchus and lung: Secondary | ICD-10-CM | POA: Diagnosis not present

## 2021-10-20 DIAGNOSIS — Z7189 Other specified counseling: Secondary | ICD-10-CM | POA: Diagnosis not present

## 2021-10-20 DIAGNOSIS — R29898 Other symptoms and signs involving the musculoskeletal system: Secondary | ICD-10-CM | POA: Diagnosis present

## 2021-10-20 DIAGNOSIS — C7901 Secondary malignant neoplasm of right kidney and renal pelvis: Secondary | ICD-10-CM | POA: Diagnosis present

## 2021-10-20 DIAGNOSIS — C50919 Malignant neoplasm of unspecified site of unspecified female breast: Secondary | ICD-10-CM

## 2021-10-20 DIAGNOSIS — R414 Neurologic neglect syndrome: Secondary | ICD-10-CM | POA: Diagnosis present

## 2021-10-20 DIAGNOSIS — Z79899 Other long term (current) drug therapy: Secondary | ICD-10-CM

## 2021-10-20 DIAGNOSIS — G40101 Localization-related (focal) (partial) symptomatic epilepsy and epileptic syndromes with simple partial seizures, not intractable, with status epilepticus: Secondary | ICD-10-CM | POA: Diagnosis present

## 2021-10-20 DIAGNOSIS — Z515 Encounter for palliative care: Secondary | ICD-10-CM

## 2021-10-20 DIAGNOSIS — Z8249 Family history of ischemic heart disease and other diseases of the circulatory system: Secondary | ICD-10-CM

## 2021-10-20 DIAGNOSIS — K7460 Unspecified cirrhosis of liver: Secondary | ICD-10-CM | POA: Diagnosis present

## 2021-10-20 DIAGNOSIS — D6959 Other secondary thrombocytopenia: Secondary | ICD-10-CM | POA: Diagnosis present

## 2021-10-20 DIAGNOSIS — R569 Unspecified convulsions: Secondary | ICD-10-CM

## 2021-10-20 DIAGNOSIS — Z833 Family history of diabetes mellitus: Secondary | ICD-10-CM

## 2021-10-20 DIAGNOSIS — N390 Urinary tract infection, site not specified: Secondary | ICD-10-CM

## 2021-10-20 DIAGNOSIS — C7801 Secondary malignant neoplasm of right lung: Secondary | ICD-10-CM | POA: Diagnosis present

## 2021-10-20 DIAGNOSIS — Z66 Do not resuscitate: Secondary | ICD-10-CM | POA: Diagnosis present

## 2021-10-20 DIAGNOSIS — I959 Hypotension, unspecified: Secondary | ICD-10-CM | POA: Diagnosis present

## 2021-10-20 DIAGNOSIS — Z90710 Acquired absence of both cervix and uterus: Secondary | ICD-10-CM | POA: Diagnosis not present

## 2021-10-20 DIAGNOSIS — G936 Cerebral edema: Secondary | ICD-10-CM | POA: Diagnosis present

## 2021-10-20 DIAGNOSIS — C7931 Secondary malignant neoplasm of brain: Secondary | ICD-10-CM | POA: Diagnosis present

## 2021-10-20 DIAGNOSIS — C7951 Secondary malignant neoplasm of bone: Secondary | ICD-10-CM | POA: Diagnosis present

## 2021-10-20 DIAGNOSIS — K5900 Constipation, unspecified: Secondary | ICD-10-CM | POA: Diagnosis present

## 2021-10-20 DIAGNOSIS — E785 Hyperlipidemia, unspecified: Secondary | ICD-10-CM | POA: Diagnosis present

## 2021-10-20 DIAGNOSIS — G8194 Hemiplegia, unspecified affecting left nondominant side: Secondary | ICD-10-CM | POA: Diagnosis not present

## 2021-10-20 DIAGNOSIS — D696 Thrombocytopenia, unspecified: Secondary | ICD-10-CM | POA: Diagnosis present

## 2021-10-20 DIAGNOSIS — R339 Retention of urine, unspecified: Secondary | ICD-10-CM | POA: Diagnosis present

## 2021-10-20 DIAGNOSIS — G40909 Epilepsy, unspecified, not intractable, without status epilepticus: Secondary | ICD-10-CM | POA: Diagnosis not present

## 2021-10-20 DIAGNOSIS — C50912 Malignant neoplasm of unspecified site of left female breast: Secondary | ICD-10-CM | POA: Diagnosis present

## 2021-10-20 DIAGNOSIS — I10 Essential (primary) hypertension: Secondary | ICD-10-CM | POA: Diagnosis present

## 2021-10-20 DIAGNOSIS — I6789 Other cerebrovascular disease: Secondary | ICD-10-CM | POA: Diagnosis present

## 2021-10-20 DIAGNOSIS — G8191 Hemiplegia, unspecified affecting right dominant side: Secondary | ICD-10-CM | POA: Diagnosis present

## 2021-10-20 DIAGNOSIS — G40919 Epilepsy, unspecified, intractable, without status epilepticus: Secondary | ICD-10-CM

## 2021-10-20 DIAGNOSIS — C7802 Secondary malignant neoplasm of left lung: Secondary | ICD-10-CM | POA: Diagnosis present

## 2021-10-20 DIAGNOSIS — Y842 Radiological procedure and radiotherapy as the cause of abnormal reaction of the patient, or of later complication, without mention of misadventure at the time of the procedure: Secondary | ICD-10-CM | POA: Diagnosis present

## 2021-10-20 DIAGNOSIS — R7989 Other specified abnormal findings of blood chemistry: Secondary | ICD-10-CM | POA: Diagnosis not present

## 2021-10-20 MED ORDER — PROCHLORPERAZINE 25 MG RE SUPP: 12.5000 mg | Freq: Four times a day (QID) | RECTAL | Status: DC | PRN

## 2021-10-20 MED ORDER — PANTOPRAZOLE SODIUM 40 MG PO TBEC
40.0000 mg | DELAYED_RELEASE_TABLET | Freq: Every day | ORAL | Status: DC
  Administered 2021-10-21 – 2021-10-28 (×8): 40 mg via ORAL
  Filled 2021-10-20 (×8): qty 1

## 2021-10-20 MED ORDER — ONDANSETRON HCL 4 MG/2ML IJ SOLN: 4.0000 mg | Freq: Four times a day (QID) | INTRAMUSCULAR | Status: DC | PRN

## 2021-10-20 MED ORDER — FUROSEMIDE 40 MG PO TABS
20.0000 mg | ORAL_TABLET | Freq: Every day | ORAL | Status: DC
  Administered 2021-10-21 – 2021-10-28 (×8): 20 mg via ORAL
  Filled 2021-10-20 (×8): qty 1

## 2021-10-20 MED ORDER — PROCHLORPERAZINE EDISYLATE 10 MG/2ML IJ SOLN: 5.0000 mg | Freq: Four times a day (QID) | INTRAMUSCULAR | Status: DC | PRN

## 2021-10-20 MED ORDER — LACOSAMIDE 200 MG/20ML IV SOLN
50.0000 mg | Freq: Two times a day (BID) | INTRAVENOUS | Status: DC | PRN
  Filled 2021-10-20: qty 5

## 2021-10-20 MED ORDER — TRAZODONE HCL 50 MG PO TABS: 25.0000 mg | ORAL_TABLET | Freq: Every evening | ORAL | Status: DC | PRN

## 2021-10-20 MED ORDER — NAYZILAM 5 MG/0.1ML NA SOLN: 5.0000 mg | Freq: Every day | NASAL | Status: DC | PRN

## 2021-10-20 MED ORDER — OXYCODONE-ACETAMINOPHEN 5-325 MG PO TABS
1.0000 | ORAL_TABLET | ORAL | Status: DC | PRN
  Administered 2021-10-21 – 2021-10-28 (×18): 1 via ORAL
  Filled 2021-10-20 (×18): qty 1

## 2021-10-20 MED ORDER — DIPHENHYDRAMINE HCL 12.5 MG/5ML PO ELIX: 12.5000 mg | ORAL_SOLUTION | Freq: Four times a day (QID) | ORAL | Status: DC | PRN

## 2021-10-20 MED ORDER — LACTULOSE 10 GM/15ML PO SOLN
10.0000 g | Freq: Two times a day (BID) | ORAL | Status: DC
Start: 2021-10-20 — End: 2021-10-21
  Administered 2021-10-20 – 2021-10-21 (×2): 10 g via ORAL
  Filled 2021-10-20 (×2): qty 15

## 2021-10-20 MED ORDER — GUAIFENESIN-DM 100-10 MG/5ML PO SYRP: 5.0000 mL | ORAL_SOLUTION | Freq: Four times a day (QID) | ORAL | Status: DC | PRN

## 2021-10-20 MED ORDER — BISACODYL 5 MG PO TBEC: 5.0000 mg | DELAYED_RELEASE_TABLET | Freq: Every day | ORAL | 0 refills | Status: AC | PRN

## 2021-10-20 MED ORDER — METOPROLOL TARTRATE 25 MG PO TABS: 12.5000 mg | ORAL_TABLET | Freq: Two times a day (BID) | ORAL | Status: DC

## 2021-10-20 MED ORDER — LACOSAMIDE 50 MG PO TABS: 50.0000 mg | ORAL_TABLET | Freq: Two times a day (BID) | ORAL | Status: DC

## 2021-10-20 MED ORDER — LORAZEPAM 2 MG/ML IJ SOLN: 2.0000 mg | INTRAMUSCULAR | Status: DC | PRN

## 2021-10-20 MED ORDER — LACOSAMIDE 50 MG PO TABS
50.0000 mg | ORAL_TABLET | Freq: Two times a day (BID) | ORAL | Status: DC
Start: 2021-10-20 — End: 2021-10-25
  Administered 2021-10-20 – 2021-10-25 (×11): 50 mg via ORAL
  Filled 2021-10-20 (×11): qty 1

## 2021-10-20 MED ORDER — OXYCODONE-ACETAMINOPHEN 5-325 MG PO TABS: 1.0000 | ORAL_TABLET | Freq: Four times a day (QID) | ORAL | Status: DC | PRN

## 2021-10-20 MED ORDER — OXYCODONE-ACETAMINOPHEN 5-325 MG PO TABS: 1.0000 | ORAL_TABLET | Freq: Four times a day (QID) | ORAL | 0 refills | Status: DC | PRN

## 2021-10-20 MED ORDER — ACETAMINOPHEN 325 MG PO TABS
325.0000 mg | ORAL_TABLET | ORAL | Status: DC | PRN
  Administered 2021-10-22 – 2021-10-28 (×6): 650 mg via ORAL
  Filled 2021-10-20 (×6): qty 2

## 2021-10-20 MED ORDER — BISACODYL 10 MG RE SUPP
10.0000 mg | Freq: Every day | RECTAL | Status: DC | PRN
  Administered 2021-10-23: 10 mg via RECTAL
  Filled 2021-10-20: qty 1

## 2021-10-20 MED ORDER — LACTULOSE 10 GM/15ML PO SOLN: 10.0000 g | Freq: Two times a day (BID) | ORAL | 0 refills | Status: DC

## 2021-10-20 MED ORDER — LEVETIRACETAM 500 MG PO TABS
2000.0000 mg | ORAL_TABLET | Freq: Two times a day (BID) | ORAL | Status: DC
  Administered 2021-10-20: 2000 mg via ORAL
  Filled 2021-10-20: qty 1

## 2021-10-20 MED ORDER — POLYETHYLENE GLYCOL 3350 17 G PO PACK
17.0000 g | PACK | Freq: Every day | ORAL | Status: DC | PRN
  Administered 2021-10-22: 17 g via ORAL
  Filled 2021-10-20: qty 1

## 2021-10-20 MED ORDER — LIDOCAINE HCL URETHRAL/MUCOSAL 2 % EX GEL: 1.0000 "application " | CUTANEOUS | Status: DC | PRN

## 2021-10-20 MED ORDER — LEVOTHYROXINE SODIUM 100 MCG PO TABS
100.0000 ug | ORAL_TABLET | Freq: Every day | ORAL | Status: DC
Start: 2021-10-21 — End: 2021-10-28
  Administered 2021-10-21 – 2021-10-28 (×8): 100 ug via ORAL
  Filled 2021-10-20 (×8): qty 1

## 2021-10-20 MED ORDER — SODIUM CHLORIDE 0.9 % IV SOLN: 2000.0000 mg | Freq: Two times a day (BID) | INTRAVENOUS | Status: DC

## 2021-10-20 MED ORDER — FLEET ENEMA 7-19 GM/118ML RE ENEM: 1.0000 | ENEMA | Freq: Once | RECTAL | Status: DC | PRN

## 2021-10-20 MED ORDER — ONDANSETRON HCL 4 MG PO TABS: 4.0000 mg | ORAL_TABLET | Freq: Four times a day (QID) | ORAL | Status: DC | PRN

## 2021-10-20 MED ORDER — ALUM & MAG HYDROXIDE-SIMETH 200-200-20 MG/5ML PO SUSP
30.0000 mL | ORAL | Status: DC | PRN
  Administered 2021-10-24: 30 mL via ORAL
  Filled 2021-10-20: qty 30

## 2021-10-20 MED ORDER — METOPROLOL TARTRATE 12.5 MG HALF TABLET
12.5000 mg | ORAL_TABLET | Freq: Two times a day (BID) | ORAL | Status: DC
  Administered 2021-10-20 – 2021-10-28 (×14): 12.5 mg via ORAL
  Filled 2021-10-20 (×15): qty 1

## 2021-10-20 MED ORDER — DEXAMETHASONE 4 MG PO TABS
4.0000 mg | ORAL_TABLET | Freq: Every day | ORAL | Status: DC
  Administered 2021-10-21 – 2021-10-25 (×5): 4 mg via ORAL
  Filled 2021-10-20 (×5): qty 1

## 2021-10-20 MED ORDER — PROCHLORPERAZINE MALEATE 5 MG PO TABS: 5.0000 mg | ORAL_TABLET | Freq: Four times a day (QID) | ORAL | Status: DC | PRN

## 2021-10-20 MED ORDER — TRAZODONE HCL 50 MG PO TABS
50.0000 mg | ORAL_TABLET | Freq: Every day | ORAL | Status: DC
  Administered 2021-10-20 – 2021-10-27 (×8): 50 mg via ORAL
  Filled 2021-10-20 (×8): qty 1

## 2021-10-20 NOTE — Plan of Care (Signed)
°  Problem: Education: Goal: Expressions of having a comfortable level of knowledge regarding the disease process will increase Outcome: Progressing   Problem: Coping: Goal: Ability to adjust to condition or change in health will improve Outcome: Progressing   Problem: Health Behavior/Discharge Planning: Goal: Compliance with prescribed medication regimen will improve Outcome: Progressing   Problem: Medication: Goal: Risk for medication side effects will decrease Outcome: Progressing   Problem: Clinical Measurements: Goal: Complications related to the disease process, condition or treatment will be avoided or minimized Outcome: Progressing   Problem: Safety: Goal: Verbalization of understanding the information provided will improve Outcome: Progressing   

## 2021-10-20 NOTE — Progress Notes (Addendum)
Physical Therapy Treatment Patient Details Name: [name] Riley MRN: [id number] DOB: 05-02-1951 Today's Date: 10/20/2021   History of Present Illness Pt is a 71 y/o female who presented on 10/14/21 with recurrent seizures while on Keppra and progressive weakening of L side. Recent admission at [facility] for seizures. PMH significant for breast cancer with metastases to brain, bone and liver, HTN, CAD, hypothyroidism.    PT Comments    Pt making gradual progress. Session focused on sitting balance and sit to stands.  Pt improving with sitting balance with frequent cues.   Continues to require max x 2 to stand.  Continue to progress as able.   Noted CT pelvis orders post-therapy - results pending  Recommendations for follow up therapy are one component of a multi-disciplinary discharge planning process, led by the attending physician.  Recommendations may be updated based on patient status, additional functional criteria and insurance authorization.  Follow Up Recommendations  Acute inpatient rehab (3hours/day)     Assistance Recommended at Discharge Frequent or constant Supervision/Assistance  Patient can return home with the following Two people to help with walking and/or transfers;Two people to help with bathing/dressing/bathroom   Equipment Recommendations  Other (comment) (defer to post acute)    Recommendations for Other Services Rehab consult     Precautions / Restrictions Precautions Precautions: Fall Precaution Comments: L side weakness     Mobility  Bed Mobility Overal bed mobility: Needs Assistance Bed Mobility: Supine to Sit;Sit to Supine;Rolling Rolling: Mod assist   Supine to sit: Mod assist Sit to supine: Mod assist   General bed mobility comments: Cues for sequencing with all, cues for reaching with R UE to assist, pt able to slide L leg off bed with min A but requiring mod A for trunk; assist for trunk control and L LE back to bed    Transfers Overall transfer  level: Needs assistance Equipment used: Ambulation equipment used;2 person hand held assist Transfers: Sit to/from Stand Sit to Stand: Max assist;+2 physical assistance           General transfer comment: Pt working on sit to stand with HHA of 2 and L leg blocked - requiring max x 2, cues to tuck buttock, and stand straight, therapist supporting L arm.  Then stood in [place] with max x 2 with plan to transfer to chair for transport to CIR.  Once in [facility] pt reports feels that she needs to have BM and can take 20 mins.  Pt unable to sit on [facility] for that long so transferred back to bed with bed pan.    Ambulation/Gait                   Stairs             Wheelchair Mobility    Modified Rankin (Stroke Patients Only)       Balance Overall balance assessment: Needs assistance Sitting-balance support: Feet supported;Single extremity supported Sitting balance-Leahy Scale: Poor Sitting balance - Comments: Initially pt leaning L with mod A; with max cues and assist able to get pt to grab bed rail with R hand and progressed to supervision.  Pt then able to move R hand to lap but tends to lean L.  Worked on weight shifting, leaning, and correcting with min A.  Had pt lean on L elbow with therapist blocking.   Standing balance support: Single extremity supported Standing balance-Leahy Scale: Zero Standing balance comment: Max 2 and unable to stand upright  Cognition Arousal/Alertness: Awake/alert Behavior During Therapy: WFL for tasks assessed/performed Overall Cognitive Status: Impaired/Different from baseline Area of Impairment: Orientation;Memory;Following commands;Safety/judgement;Awareness;Problem solving;Attention                 Orientation Level: Disoriented to;Time Current Attention Level: Sustained Memory: Decreased short-term memory Following Commands: Follows one step commands consistently;Follows multi-step commands  inconsistently Safety/Judgement: Decreased awareness of deficits;Decreased awareness of safety Awareness: Intellectual Problem Solving: Slow processing;Decreased initiation;Difficulty sequencing;Requires verbal cues;Requires tactile cues General Comments: Pt confused at times. She is easily distracted and frequently needs cues to focus on task at hand        Exercises   1x10 AAROM heel slides L in supine 2x10 AROM LAQ and knee flexion on R in sitting with cues for controlled full ROM 2x5 AROM transitioning to AAROM LAQ on L in sitting with cues for controlled full ROM, fatigued easily   General Comments        Pertinent Vitals/Pain      Home Living                          Prior Function            PT Goals (current goals can now be found in the care plan section) Progress towards PT goals: Progressing toward goals    Frequency    Min 3X/week      PT Plan Current plan remains appropriate    Co-evaluation              AM-PAC PT "6 Clicks" Mobility   Outcome Measure  Help needed turning from your back to your side while in a flat bed without using bedrails?: A Lot Help needed moving from lying on your back to sitting on the side of a flat bed without using bedrails?: A Lot Help needed moving to and from a bed to a chair (including a wheelchair)?: Total Help needed standing up from a chair using your arms (e.g., wheelchair or bedside chair)?: Total Help needed to walk in hospital room?: Total Help needed climbing 3-5 steps with a railing? : Total 6 Click Score: 8    End of Session Equipment Utilized During Treatment: Gait belt Activity Tolerance: Patient tolerated treatment well Patient left: in bed;with call bell/phone within reach;with bed alarm set (on bed pan) Nurse Communication: Mobility status (on bed pan) PT Visit Diagnosis: Unsteadiness on feet (R26.81);Other abnormalities of gait and mobility (R26.89);Muscle weakness (generalized)  (M62.81)     Time: 3734-2876 PT Time Calculation (min) (ACUTE ONLY): 25 min  Charges:  $Therapeutic Activity: 8-22 mins $Neuromuscular Re-education: 8-22 mins                     Abran Richard, PT Acute Rehab Services Pager [phone] [facility] [place] 10/20/2021, 2:12 PM

## 2021-10-20 NOTE — Progress Notes (Signed)
Inpatient Rehab Admissions Coordinator:   Admit to CIR on holding pending results of CT pelvis.  Will follow.   Shann Medal, PT, DPT Admissions Coordinator [phone] 10/20/21  1:50 PM

## 2021-10-20 NOTE — H&P (Shared)
Physical Medicine and Rehabilitation Admission H&P    CC:  Functional deficits   HPI: [name] Riley is a 71 year old female with history of Left breast cancer 08/2011 with recurrence and mets to lungs, right kidney, R-sternum/scapula/ribs/spine/R-femur/skull as well as right frontal,  parietal and temporal lobe with edema and new onset seizures with breakthrough seizures and significant decline. She was admitted on 10/14/21 per Dr. Renda Rolls input for management/assessment of increase in left sided weakness with hallucinations and twitching of left hand. She was placed on LTM EEG due to concerns of refractory post ictal Todd's paralysis v/c occult focal status epilepticus. She was found to have one seizure without clinical signs as well as LUE twitching due to focal convulsive status epilepticus with encephalopathy. She was treated with IV ativan and started on Vimpat due to breakthrough seizures.   She was noted to have elevated liver enzymes with distended abdomen and BLE edema---> Liver ultrasound showed increase in size of liver mets and diffuse hepatocellular disease--concerns of cirrhosis v/s worsening of metastasis.  Ammonia level noted to be elevated and she was started on lactulose.  Follow up MRI brain done w/contrast on 01/01 showing right cerebral lesions in right frontal lobe with increased c/w 11/18 with vasogenic edema.  Seizures have resolved with recommendations to increase Vimpat to 100 mg bid prn breakthrough episodes, keep decadron to 4 mg and Keppra to 2000 mg bid. Confusion has resolved and mentation back to baseline. She continues to have Left> right sided weakness. Therapy has been ongoing and patient noted to be limited by fatigue, weakness with left lateral lean with inability to maintain midline posture at EOB and standing attempts with LLE blocked. She did report increase in left> right hip pain with activity.  X rays done today revealing multiple sclerotic densities in  pelvis and left hip with mildly displaced L-acetabular fracture.  CT pelvis   CIR recommended due to recent functional decline.      Review of Systems  Constitutional:  Negative for chills and fever.  Eyes:  Positive for blurred vision (worse since surgery).  Respiratory:  Negative for shortness of breath.   Cardiovascular:  Negative for chest pain and leg swelling.  Gastrointestinal:  Negative for heartburn and nausea.  Musculoskeletal:  Positive for joint pain and myalgias.  Skin:  Negative for rash.  Neurological:  Positive for sensory change, focal weakness and weakness.  Psychiatric/Behavioral:  Positive for hallucinations.     Past Medical History:  Diagnosis Date   Allergy    Breast cancer ([place]) 08/25/2011   L , invasive ductal carcinoma, ER/PR +,HER2 -   Cancer ([place])    left breast cancer   Coronary artery disease 2001   Heart attack [facility]) 09/2000   Sep 25, 2000  --no intervention   History of chemotherapy comp. 08/22/2012   4 cycles of FEC and $ cycles of Taxotere   Hyperlipidemia    Hypertension    Hypothyroidism    PONV (postoperative nausea and vomiting)    gets sick from anesthesia   Seizures ([place])    Status post radiation therapy 07/09/12 - 08/22/2012   Left Breast, 60.4 gray    Past Surgical History:  Procedure Laterality Date   ABDOMINAL HYSTERECTOMY  1998   TAH, oophorectomy   APPENDECTOMY  1970   BREAST CYST EXCISION Left 07/29/2020   Procedure: WIDE EXCISION OF LEFT MASTECTOMY INCISION;  Surgeon: Donnie Mesa, MD;  Location: [place];  Service: General;  Laterality: Left;   BREAST SURGERY  1998   removal of benign lump in rt breast   BREAST SURGERY  11/14/11   right simple mastectomy, left mrm   INCISION AND DRAINAGE OF WOUND Left 07/01/2017   Procedure: IRRIGATION AND DEBRIDEMENT CHEST WALL ABSCESS;  Surgeon: Donnie Mesa, MD;  Location: [facility];  Service: General;  Laterality: Left;   MASS EXCISION Left 06/22/2017   Procedure:  EXCISION OF CHEST WALL MASSES;  Surgeon: Donnie Mesa, MD;  Location: [place];  Service: General;  Laterality: Left;   MASTECTOMY Bilateral    for left breast cancer   OVARIAN CYST SURGERY Right [place]  08/30/2012   Procedure: REMOVAL PORT-A-CATH;  Surgeon: Imogene Burn. [name], MD;  Location: [place];  Service: General;  Laterality: Right;  port removal   PORTACATH PLACEMENT  11/14/2011   Procedure: INSERTION PORT-A-CATH;  Surgeon: Imogene Burn. [name], MD;  Location: [employer];  Service: General;  Laterality: Right;   PORTACATH PLACEMENT N/A 04/25/2016   Procedure: INSERTION PORT-A-CATH LEFT CHEST;  Surgeon: Donnie Mesa, MD;  Location: [place];  Service: General;  Laterality: N/A;   skin tags  05/09/1997   left axillary left neck skin tags   TONSILLECTOMY  1968    Family History  Problem Relation Age of Onset   Hypertension Maternal Grandmother    Diabetes Maternal Grandmother    Cancer Father 30       lung cancer and Prostate Cancer   Hypertension Mother    Cancer Paternal Aunt        ovarian   Cancer Cousin        breast, paternal cousin   Cancer Paternal Uncle        stomach   Cancer Paternal Grandfather        Esophagus   Colon cancer Neg Hx     Social History: Married. Was independent and ambulatory prior to seizures. Per  reports that she has never smoked. She has never used smokeless tobacco. She reports that she does not drink alcohol and does not use drugs.   Allergies  Allergen Reactions   Cantaloupe Extract Allergy Skin Test Shortness Of Breath   Contrast Media [Iodinated Contrast Media] Shortness Of Breath and Rash   Pravastatin Other (See Comments)    Legs hurt   Zosyn [Piperacillin Sod-Tazobactam So] Rash and Other (See Comments)    Temperature increase, facial flushing   Latex Rash    Redness, itch    Medications Prior to Admission  Medication Sig Dispense Refill   acetaminophen (TYLENOL) 500 MG tablet Take  1,000 mg by mouth every 6 (six) hours as needed for moderate pain.     dexamethasone (DECADRON) 4 MG tablet Take 1 tablet (4 mg total) by mouth daily. Start the day after chemotherapy for 2 days. 20 tablet 0   diphenhydrAMINE (BENADRYL) 25 MG tablet Take 25 mg by mouth at bedtime as needed for sleep.     furosemide (LASIX) 20 MG tablet TAKE ONE (1) TABLET BY MOUTH EVERY DAY (Patient taking differently: Take 20 mg by mouth daily.) 30 tablet 1   levETIRAcetam (KEPPRA) 1000 MG tablet Take 2 tablets (2,000 mg total) by mouth 2 (two) times daily.     levothyroxine (SYNTHROID) 100 MCG tablet TAKE ONE TABLET EACH MORNING BEFORE BREAKFAST (Patient taking differently: Take 100 mcg by mouth daily.) 90 tablet 3   lidocaine-prilocaine (EMLA) cream Apply 1 application topically daily as needed (port access).  LORazepam (ATIVAN) 1 MG tablet Take 1 tablet (1 mg total) by mouth 3 (three) times daily as needed for up to 8 doses for seizure (Take 1 tablet by mouth if you have recurrent episodes of left hand shaking.). 8 tablet 0   metoprolol succinate (TOPROL-XL) 100 MG 24 hr tablet Take with or immediately following a meal. (Patient taking differently: Take 100 mg by mouth daily. Take with or immediately following a meal.) 90 tablet 3   Multiple Vitamins-Minerals (MULTIVITAMIN WITH MINERALS) tablet Take 1 tablet by mouth daily.     ondansetron (ZOFRAN) 8 MG tablet Take 1 tablet (8 mg total) by mouth 2 (two) times daily as needed for refractory nausea / vomiting. Start on day 3 after chemo. 30 tablet 1   prochlorperazine (COMPAZINE) 10 MG tablet Take 1 tablet (10 mg total) by mouth every 6 (six) hours as needed (Nausea or vomiting). 30 tablet 1   traZODone (DESYREL) 50 MG tablet Take 1 tablet (50 mg total) by mouth at bedtime. (Patient taking differently: Take 50 mg by mouth at bedtime as needed for sleep.) 30 tablet 3   valsartan (DIOVAN) 80 MG tablet Take 80 mg by mouth daily.     pantoprazole (PROTONIX) 40 MG  tablet Take 1 tablet (40 mg total) by mouth daily. (Patient not taking: Reported on 10/15/2021) 30 tablet 1    Drug Regimen Review { DRUG REGIMEN [id number]}  Home: Home Living Family/patient expects to be discharged to:: Inpatient rehab Living Arrangements: Spouse/significant other Available Help at Discharge: Family, Available 24 hours/day Type of Home: House Home Access: Stairs to enter [employer] of Steps: 4 Entrance Stairs-Rails: Right, Left, Can reach both Home Layout: Two level, Able to live on main level with bedroom/bathroom, Full bath on main level Alternate Level Stairs-Number of Steps: 8 landing + 8 Alternate Level Stairs-Rails: Right, Left, Can reach both Bathroom Shower/Tub: Multimedia programmer: Standard Bathroom Accessibility: Yes Home Equipment: Wheelchair - manual, BSC/3in1, Conservation officer, nature (2 wheels) (gait belt)   Functional History: Prior Function Prior Level of Function : Needs assist Physical Assist : Mobility (physical), ADLs (physical) Mobility (physical): Bed mobility, Transfers, Gait, Stairs ADLs (physical): Grooming, Bathing, Dressing, Toileting, IADLs Mobility Comments: SPT to wheelchair; taking a couple steps at a time in the house ADLs Comments: family assist to sponge bathe, dress and do limited household chores like folding towels.  Functional Status:  Mobility: Bed Mobility Overal bed mobility: Needs Assistance Bed Mobility: Supine to Sit Supine to sit: Mod assist Sit to supine: Max assist General bed mobility comments: Assist for LLE flexion and LUE reach for roll to the R. Bed pad utilized for full roll and to control trunk into sitting. Transfers Overall transfer level: Needs assistance Equipment used: Ambulation equipment used Transfers: Sit to/from Stand, Bed to chair/wheelchair/BSC Sit to Stand: +2 physical assistance, From elevated surface, Mod assist, Max assist Bed to/from chair/wheelchair/BSC transfer  type:: Via Lift equipment  Lateral/Scoot Transfers: Mod assist, +2 physical assistance Transfer via Lift Equipment: Stedy General transfer comment: Stedy utilized for bed>chair this session. Pt required +2 assist for power up to stand, and increased assist at the end to extend hips fully to get flaps of the Stedy in position for pt to sit. L lateral lean in sitting and standing required assist from therapist to hold midline posture during move to the chair. Ambulation/Gait General Gait Details: Unable to progress to ambulation this session.    ADL:    Cognition: Cognition Overall Cognitive Status: Impaired/Different  from baseline Orientation Level: Oriented to person, Oriented to place, Oriented to situation Cognition Arousal/Alertness: Awake/alert Behavior During Therapy: Flat affect Overall Cognitive Status: Impaired/Different from baseline Area of Impairment: Orientation, Memory, Following commands, Safety/judgement, Awareness, Problem solving Orientation Level: Disoriented to, Time Memory: Decreased short-term memory Following Commands: Follows one step commands consistently, Follows one step commands with increased time, Follows multi-step commands inconsistently Safety/Judgement: Decreased awareness of deficits, Decreased awareness of safety Awareness: Intellectual Problem Solving: Slow processing, Decreased initiation, Difficulty sequencing, Requires verbal cues, Requires tactile cues General Comments: Pt confused at times about situation and need for rehab. Reports she is going home tomorrow and her hair Hansel Feinstein is coming to the house to do her hair.   Blood pressure (!) 97/59, pulse 85, temperature 98.4 F (36.9 C), temperature source Oral, resp. rate 17, height 5' 8"  (1.727 m), weight 66.5 kg, SpO2 100 %. Physical Exam Vitals and nursing note reviewed.  Constitutional:      Appearance: Normal appearance.  Skin:    Findings: Bruising present.     Comments: Multiple  petechiae bilateral forearm. Bilateral shins with stasis changes, bruising and skin tears. Sacrum with mild erythema and bony without padding.   Neurological:     Mental Status: She is alert and oriented to person, place, and time.     Comments: Left inattention with sensory deficits LUE and LLE. Left hemiplegia with emerging tone LLE. Speech clear and able to follow simple motor commands. Lacks insight awareness of deficits.     No results found. However, due to the size of the patient record, not all encounters were searched. Please check Results Review for a complete set of results. DG HIP UNILAT WITH PELVIS 2-3 VIEWS LEFT  Result Date: 10/20/2021 CLINICAL DATA:  Bilateral hip pain right worse than the left. History of breast cancer. EXAM: DG HIP (WITH OR WITHOUT PELVIS) 2-3V LEFT COMPARISON:  None. FINDINGS: There are multiple sclerotic densities overlying the pelvis and left hip highly suspicious for metastatic disease. There is a linear lucency projecting over the acetabulum and femoral head, findings are suspicious for acetabular fracture. IMPRESSION: 1. Multiple sclerotic densities overlying the pelvis and left hip concerning for metastatic disease. 2. Linear lucency projecting over the acetabulum and femoral head, suspicious for mildly displaced acetabular fracture. Cross-sectional imaging would be helpful for further evaluation. Electronically Signed   By: Keane Police D.O.   On: 10/20/2021 12:12   DG HIP UNILAT WITH PELVIS 2-3 VIEWS RIGHT  Result Date: 10/20/2021 CLINICAL DATA:  Bilateral hip pain. History of metastatic breast cancer. EXAM: DG HIP (WITH OR WITHOUT PELVIS) 2-3V RIGHT COMPARISON:  None. FINDINGS: There is no evidence of hip fracture or dislocation. Sclerotic densities are seen throughout the pelvis and visualized proximal femur and sacrum consistent with osseous metastatic disease. Moderate degenerative joint disease is seen involving both hips. IMPRESSION: Diffuse osseous  metastatic disease as described above. 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 No fractures noted. Electronically Signed   By: Marijo Conception M.D.   On: 10/20/2021 11:47       Medical Problem List and Plan: 1. Functional deficits secondary to ***  -patient may *** shower  -ELOS/Goals: *** 2.  Antithrombotics: -DVT/anticoagulation:  Mechanical: Sequential compression devices, below knee Bilateral lower extremities  -antiplatelet therapy: N/A 3. Pain Management: Oxycodone prn.  4. Mood: LCSW to follow for evaluation and support.   -antipsychotic agents: N/a 5. Neuropsych: This patient *** capable of making decisions on *** own behalf. 6. Skin/Wound Care:  routine pressure relief measures.   --will order air mattress overlay to prevent breakdown. Prevalon boots for sore heels.   --aquacell with foam dressing to skin tears on bilateral shins.     7. Fluids/Electrolytes/Nutrition: Monitor I/O. Check lytes in am.  8. Focal status epilepticus:  Continue Keppra 2000 mg BID and Vimpat 50 mg BID.   --Vimpat 50 mg IV every 12 hours prn breakthrough and Ativan 2 mg IV prn seizure lasting more than 5 minutes. Contact neurology for breakthrough seizure.  9. Hepatic cirrhosis: Ammonia levels remain elevated 47-->58. Continue lactulose 10 mg bid.   --recheck ammonia level in am.   --continue to trend LFTs.  10. Breast cancer with extensive bony mets: 11. Cerebral mets: Continue decadron 4 mg daily.  12. Thrombocytopenia: Plts 92-->59-->51. Continue to trend H/H/plts.  --Will monitor for signs of bleeding.  13. HTN: monitor BP TID--on lasix and metoprolol.    --metoprolol decreased 01/04 due to hypotension.     ***  Bary Leriche, PA-C 10/20/2021

## 2021-10-20 NOTE — Progress Notes (Signed)
Patient complained of right hip pain since yesterday.   X-rays of both hips were obtained. Diffuse osseous metastatic disease seen. Additionally there is also linear lucency projecting over the acetabulum and femoral head suspicious for mildly displaced acetabular fracture. Radiologist recommends of cross-sectional imaging. Will obtain CT scan of pelvis.  I have communicated with result to the patient, her husband.

## 2021-10-20 NOTE — Progress Notes (Signed)
Pt required I/O cath this evening 800 mL returned. No complications noted. Pt/family educated on importance of bladder management.  Sheela Stack, LPN

## 2021-10-20 NOTE — Progress Notes (Signed)
PMR Admission Coordinator Pre-Admission Assessment   Patient: [name] Riley is an 71 y.o., female MRN: [id number] DOB: [birth year] Height: _0  (172.7 cm) Weight: 66.5 kg   Insurance Information HMO:     PPO:      PCP:      IPA:      80/20:      OTHER:  PRIMARY: Medicare A/B      Policy#: [id number]      Subscriber: pt CM Name:       Phone#:      Fax#:  Pre-Cert#: verified Civil engineer, contracting:  Benefits:  Phone #:      Name:  Eff. Date: A and B 10/17/17     Deduct: $1600      Out of Pocket Max: n/a      Life Max: n/a CIR: 100%      SNF: 20 full days Outpatient: 80%     Co-Ins: 20% Home Health: 100%      Co-Pay: DME: 80%     Co-Ins: 20% Providers:  SECONDARY: Engineer, production Supplement      Policy#:[id number]      Phone#:    Development worker, community:       Phone#:    The Actuary for patients in Inpatient Rehabilitation Facilities with attached Privacy Act [place] Records was provided and verbally reviewed with: Patient and Family   Emergency Contact Information Contact Information       Name Relation Home Work Mobile    [place] Spouse     [place] Daughter     [phone]    Mariselda, [place] [phone]           Current Medical History  Patient Admitting Diagnosis: 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  Pt presented again to [facility] ED on 12/29 at the direction of her Neuro-oncologist for evaluation of increased left-sided weakness, possible left hand twitching, and concern for Todd's paralysis versus occult status epilepticus.  Pt c/o 9 day history of progressive weakness, LE swelling, confusion, and hallucinations.  IN ED pt afebrile, hemodynamically stable, and on  room air.  Labs showed elevated LFTs.  MRI brain w/o contrast showed continued progression of extensive white matter vasogenic edema in right frontal lobe and portions of right parietal lobe.  Neurology was consulted and recommended long term EEG which showed no seizures, but epileptogenicity and cortical dysfunction arising from right posterior quadrant likely 2/2 underlying mass.  Also recommended Keppra 2000 mg BID, Vimpat 50 mg BID, and seizure precautions.  Neuro-oncology recommended continue decadron 69m QD.  Dr. GLindi Adieand Dr. VMickeal Skinnerboth consulted and report okay to hold chemotherapy (originally scheduled for 1/10) until after rehab completed. Pt c/o hip pain bilaterally on 1/4, xrays obtained which showed possible acetabular fracture.  CT for further evaluation showed no evidence of fracture.  Therapy ongoing and pt recommended for CIR.    [facility] admission coordinator and physician.   Past Medical History      Past Medical History:  Diagnosis Date   Allergy     Breast cancer ([facility] 08/25/2011    L , invasive ductal carcinoma, ER/PR +,HER2 -  Cancer [facility])      left breast cancer   Coronary artery disease 2001   Heart attack [facility]) 09/2000    Sep 25, 2000  --no intervention   History of chemotherapy comp. 08/22/2012    4 cycles of FEC and $ cycles of Taxotere   Hyperlipidemia     Hypertension     Hypothyroidism     PONV (postoperative nausea and vomiting)      gets sick from anesthesia   Seizures [facility])     Status post radiation therapy 07/09/12 - 08/22/2012    Left Breast, 60.4 gray      Has the patient had major surgery during 100 days prior to admission? No   Family History   family history includes Cancer in her cousin, paternal aunt, paternal grandfather, and paternal uncle; Cancer (age of onset: 23) in her father; Diabetes in her maternal grandmother; Hypertension in her maternal grandmother and  mother.   Current Medications   Current Facility-Administered Medications:    acetaminophen (TYLENOL) tablet 325 mg, 325 mg, Oral, Q6H PRN, 325 mg at 10/15/21 2252 **OR** acetaminophen (TYLENOL) suppository 325 mg, 325 mg, Rectal, Q6H PRN, Opyd, Ilene Qua, MD   bisacodyl (DULCOLAX) EC tablet 5 mg, 5 mg, Oral, Daily PRN, Opyd, Ilene Qua, MD, 5 mg at 10/16/21 1106   Chlorhexidine Gluconate Cloth 2 % PADS 6 each, 6 each, Topical, Daily, Dahal, Binaya, MD, 6 each at 10/19/21 0845   dexamethasone (DECADRON) tablet 4 mg, 4 mg, Oral, Daily, Opyd, Ilene Qua, MD, 4 mg at 10/19/21 0827   furosemide (LASIX) tablet 20 mg, 20 mg, Oral, Daily, Opyd, Ilene Qua, MD, 20 mg at 10/19/21 0827   lacosamide (VIMPAT) 50 mg in sodium chloride 0.9 % 25 mL IVPB, 50 mg, Intravenous, Q12H PRN, Dahal, Marlowe Aschoff, MD   [DISCONTINUED] lacosamide (VIMPAT) 50 mg in sodium chloride 0.9 % 25 mL IVPB, 50 mg, Intravenous, Q12H **OR** lacosamide (VIMPAT) tablet 50 mg, 50 mg, Oral, Q12H, Lora Havens, MD, 50 mg at 10/19/21 0827   lactulose (Bright) 10 GM/15ML solution 10 g, 10 g, Oral, BID, Dahal, Binaya, MD, 10 g at 10/19/21 0827   levETIRAcetam (KEPPRA) tablet 2,000 mg, 2,000 mg, Oral, BID, 2,000 mg at 10/19/21 0826 **OR** levETIRAcetam (KEPPRA) 2,000 mg in sodium chloride 0.9 % 250 mL IVPB, 2,000 mg, Intravenous, BID, Dahal, Binaya, MD   levothyroxine (SYNTHROID) tablet 100 mcg, 100 mcg, Oral, Q0600, Opyd, Ilene Qua, MD, 100 mcg at 10/19/21 0606   LORazepam (ATIVAN) injection 2 mg, 2 mg, Intravenous, Q4H PRN, Lora Havens, MD, 2 mg at 10/15/21 0742   metoprolol tartrate (LOPRESSOR) tablet 12.5 mg, 12.5 mg, Oral, BID, Dahal, Binaya, MD, 12.5 mg at 10/19/21 0827   ondansetron (ZOFRAN) tablet 4 mg, 4 mg, Oral, Q6H PRN **OR** ondansetron (ZOFRAN) injection 4 mg, 4 mg, Intravenous, Q6H PRN, Opyd, Ilene Qua, MD   pantoprazole (PROTONIX) EC tablet 40 mg, 40 mg, Oral, Daily, Opyd, Timothy S, MD, 40 mg at 10/19/21 0827   sodium  chloride flush (NS) 0.9 % injection 3 mL, 3 mL, Intravenous, Q12H, Opyd, Ilene Qua, MD, 3 mL at 10/19/21 0828   traZODone (DESYREL) tablet 50 mg, 50 mg, Oral, QHS, Opyd, Ilene Qua, MD, 50 mg at 10/18/21 2239   Patients Current Diet:  Diet Order                  Diet Heart Room service appropriate? Yes; Fluid consistency: Thin  Diet effective now  Precautions / Restrictions Precautions Precautions: Fall Precaution Comments: L side weakness Restrictions Weight Bearing Restrictions: No    Has the patient had 2 or more falls or a fall with injury in the past year? Yes   Prior Activity Level Household: very limited, especially in the last few weeks, requiring assist for all mobility and ADLs (likely min to mod), w/c level in household with spouse providing assist   Prior Functional Level Self Care: Did the patient need help bathing, dressing, using the toilet or eating? Needed some help   Indoor Mobility: Did the patient need assistance with walking from room to room (with or without device)? Needed some help   Stairs: Did the patient need assistance with internal or external stairs (with or without device)? Needed some help   Functional Cognition: Did the patient need help planning regular tasks such as shopping or remembering to take medications? Needed some help   Patient Information Are you of Hispanic, Latino/a,or Spanish origin?: A. No, not of Hispanic, Latino/a, or Spanish origin What is your race?: A. White Do you need or want an interpreter to communicate with a doctor or health care staff?: 0. No   Patient's Response To:  Health Literacy and Transportation Is the patient able to respond to health literacy and transportation needs?: Yes Health Literacy - How often do you need to have someone help you when you read instructions, pamphlets, or other written material from your doctor or pharmacy?: Sometimes In the past 12 months, has lack of  transportation kept you from medical appointments or from getting medications?: No In the past 12 months, has lack of transportation kept you from meetings, work, or from getting things needed for daily living?: No   Development worker, international aid / [place] Devices/Equipment: Grab bars around toilet, Wheelchair, Bedside commode/3-in-1 Home Equipment: Wheelchair - manual, BSC/3in1, Conservation officer, nature (2 wheels) (gait belt)   Prior Device Use: Indicate devices/aids used by the patient prior to current illness, exacerbation or injury? Manual wheelchair and Walker   Current Functional Level Cognition   Overall Cognitive Status: Impaired/Different from baseline Orientation Level: Oriented X4 Following Commands: Follows one step commands consistently, Follows one step commands with increased time, Follows multi-step commands inconsistently Safety/Judgement: Decreased awareness of deficits, Decreased awareness of safety General Comments: Pt confused at times about situation and need for rehab. Reports she is going home tomorrow and her hair Hansel Feinstein is coming to the house to do her hair.    Extremity Assessment (includes Sensation/Coordination)   Upper Extremity Assessment: LUE deficits/detail LUE Deficits / Details: Able to grip with increased time to initiate. 2/5 strength for shoulder flexion, and 1/5 strength for wrist flexion/extensiono, elbow flexion/extension. LUE Sensation: decreased proprioception, decreased light touch LUE Coordination: decreased gross motor, decreased fine motor  Lower Extremity Assessment: LLE deficits/detail LLE Deficits / Details: Grossly 2/5 strength in quads, hip flexors, ankle dorsiflexors, hamstrings. LLE Sensation: decreased proprioception, decreased light touch LLE Coordination: decreased gross motor, decreased fine motor     ADLs         Mobility   Overal bed mobility: Needs Assistance Bed Mobility: Supine to Sit Supine to sit: Mod assist Sit to  supine: Max assist General bed mobility comments: Assist for LLE flexion and LUE reach for roll to the R. Bed pad utilized for full roll and to control trunk into sitting.     Transfers   Overall transfer level: Needs assistance Equipment used: Ambulation equipment used Transfers: Sit to/from Stand, Bed to chair/wheelchair/BSC  Sit to Stand: +2 physical assistance, From elevated surface, Mod assist, Max assist Bed to/from chair/wheelchair/BSC transfer type:: Via Lift equipment  Lateral/Scoot Transfers: Mod assist, +2 physical assistance Transfer via Lift Equipment: Stedy General transfer comment: Stedy utilized for bed>chair this session. Pt required +2 assist for power up to stand, and increased assist at the end to extend hips fully to get flaps of the Stedy in position for pt to sit. L lateral lean in sitting and standing required assist from therapist to hold midline posture during move to the chair.     Ambulation / Gait / Stairs / Wheelchair Mobility   Ambulation/Gait General Gait Details: Unable to progress to ambulation this session.     Posture / Balance Dynamic Sitting Balance Sitting balance - Comments: approaching fair at EOB. Increased time to gain/maintain sitting balance. Balance Overall balance assessment: Needs assistance Sitting-balance support: Feet supported, No upper extremity supported Sitting balance-Leahy Scale: Poor Sitting balance - Comments: approaching fair at EOB. Increased time to gain/maintain sitting balance. Postural control: Left lateral lean, Posterior lean Standing balance support: Bilateral upper extremity supported, Reliant on assistive device for balance Standing balance-Leahy Scale: Zero Standing balance comment: max assist required     Special needs/care consideration Skin generalized ecchymosis and Special service needs follow with Dr. Jeanine Luz and Dr. Mickeal Skinner    Previous Home Environment (from acute therapy documentation) Living Arrangements:  Spouse/significant other Available Help at Discharge: Family, Available 24 hours/day Type of Home: House Home Layout: Two level, Able to live on main level with bedroom/bathroom, Full bath on main level Alternate Level Stairs-Rails: Right, Left, Can reach both Alternate Level Stairs-Number of Steps: 8 landing + 8 Home Access: Stairs to enter Entrance Stairs-Rails: Right, Left, Can reach both Entrance Stairs-Number of Steps: 4 Bathroom Shower/Tub: Multimedia programmer: Associate Professor Accessibility: Yes Home Care Services: No   Discharge Living Setting Plans for Discharge Living Setting: Patient's home, Lives with (comment) (spouse) Type of Home at Discharge: House Discharge Home Layout: Able to live on main level with bedroom/bathroom Discharge Home Access: Stairs to enter Entrance Stairs-Rails: Can reach both Entrance Stairs-Number of Steps: 4 Discharge Bathroom Shower/Tub: Walk-in shower Discharge Bathroom Toilet: Standard Discharge Bathroom Accessibility: Yes How Accessible: Accessible via wheelchair Does the patient have any problems obtaining your medications?: No   Social/Family/Support Systems Patient Roles: Spouse Anticipated Caregiver: spouse, Rosalee Tolley Anticipated Caregiver's Contact Information: [phone] Ability/Limitations of Caregiver: n/a Caregiver Availability: 24/7 Discharge Plan Discussed with Primary Caregiver: Yes Is Caregiver In Agreement with Plan?: Yes Does Caregiver/Family have Issues with Lodging/Transportation while Pt is in Rehab?: No   Goals Patient/Family Goal for Rehab: PT/OT min assist, SLP supervision Expected length of stay: 12-14 Additional Information: plan to hold chemo treatments until after rehab discharge Pt/Family Agrees to Admission and willing to participate: Yes Program Orientation Provided & Reviewed with Pt/Caregiver Including Roles  & Responsibilities: Yes Additional Information Needs: oncology team: Dr.  Lindi Adie and Dr. Mickeal Skinner  Barriers to Discharge: Pending chemo/radiation, Medical stability  Barriers to Discharge Comments: 1/10 chemo on hold, next scheduled is 1/31   Decrease burden of Care through IP rehab admission: n/a   Possible need for SNF placement upon discharge: not anticipated   Patient Condition: I have reviewed medical records from [facility], spoken with  [facility] team , and patient and spouse. I met with patient at the bedside for inpatient rehabilitation assessment.  Patient will benefit from ongoing PT, OT, and SLP, can actively participate in 3 hours of therapy  a day 5 days of the week, and can make measurable gains during the admission.  Patient will also benefit from the coordinated team approach during an Inpatient Acute Rehabilitation admission.  The patient will receive intensive therapy as well as Rehabilitation physician, nursing, social worker, and care management interventions.  Due to bladder management, bowel management, safety, skin/wound care, disease management, medication administration, pain management, and patient education the patient requires 24 hour a day rehabilitation nursing.  The patient is currently mod/max +2 with mobility and basic ADLs.  Discharge setting and therapy post discharge at home with home health is anticipated.  Patient has agreed to participate in the Acute Inpatient Rehabilitation Program and will admit today.   Preadmission Screen Completed By:  Michel Santee, PT, DPT 10/19/2021 1:09 PM ______________________________________________________________________   Discussed status with Dr. Ranell Patrick on 10/20/21  at 10:31 AM  and received approval for admission today.   Admission Coordinator:  Michel Santee, PT, DPT time 10:31 AM Sudie Grumbling 10/20/21     Assessment/Plan: Diagnosis: Metastatic breast cancer with breakthrough seizures Does the need for close, 24 hr/day Medical supervision in concert with the patient's rehab needs make it unreasonable for  this patient to be served in a less intensive setting? Yes Co-Morbidities requiring supervision/potential complications: hypothyroidism, essential hypertension, CAD without angina, thrombocytopenia, hyperlipidemia Due to bladder management, bowel management, safety, skin/wound care, disease management, medication administration, pain management, and patient education, does the patient require 24 hr/day rehab nursing? Yes Does the patient require coordinated care of a physician, rehab nurse, PT, OT, and SLP to address physical and functional deficits in the context of the above medical diagnosis(es)? Yes Addressing deficits in the following areas: balance, endurance, locomotion, strength, transferring, bowel/bladder control, bathing, dressing, feeding, grooming, toileting, cognition, and psychosocial support Can the patient actively participate in an intensive therapy program of at least 3 hrs of therapy 5 days a week? Yes The potential for patient to make measurable gains while on inpatient rehab is excellent Anticipated functional outcomes upon discharge from inpatient rehab: min assist PT, supervision OT, independent SLP Estimated rehab length of stay to reach the above functional goals is: 14-21 days Anticipated discharge destination: Home 10. Overall Rehab/Functional Prognosis: excellent     MD Signature: Leeroy Cha, MD

## 2021-10-20 NOTE — H&P (Signed)
Physical Medicine and Rehabilitation Admission H&P    CC:  Functional deficits  HPI: [name] Riley is a 71 year old female with history of Left breast cancer 08/2011 with recurrence and mets to lungs, right kidney, R-sternum/scapula/ribs/spine/R-femur/skull as well as right frontal,  parietal and temporal lobe with edema and new onset seizures with breakthrough seizures and significant decline. She was admitted on 10/14/21 per Dr. Renda Rolls input for management/assessment of increase in left sided weakness with hallucinations and twitching of left hand. She was placed on LTM EEG due to concerns of refractory post ictal Todd's paralysis v/c occult focal status epilepticus. She was found to have one seizure without clinical signs as well as LUE twitching due to focal convulsive status epilepticus with encephalopathy. She was treated with IV ativan and started on Vimpat due to breakthrough seizures.   She was noted to have elevated liver enzymes with distended abdomen and BLE edema---> Liver ultrasound showed increase in size of liver mets and diffuse hepatocellular disease--concerns of cirrhosis v/s worsening of metastasis.  Ammonia level noted to be elevated and she was started on lactulose.  Follow up MRI brain done w/contrast on 01/01 showing right cerebral lesions in right frontal lobe with increased c/w 11/18 with vasogenic edema.  Seizures have resolved with recommendations to increase Vimpat to 100 mg bid prn breakthrough episodes, keep decadron to 4 mg and Keppra to 2000 mg bid. Confusion has resolved and mentation back to baseline. She continues to have Left> right sided weakness. Therapy has been ongoing and patient noted to be limited by fatigue, weakness with left lateral lean with inability to maintain midline posture at EOB and standing attempts with LLE blocked. She did report increase in left> right hip pain with activity.  X rays done today revealing multiple sclerotic densities in pelvis  and left hip with mildly displaced L-acetabular fracture.  CT pelvis. She is eager to begin rehab with plans for chemo afterward.   CIR recommended due to recent functional decline.      Review of Systems  Constitutional:  Negative for chills and fever.  Eyes:  Positive for blurred vision (worse since surgery).  Respiratory:  Negative for shortness of breath.   Cardiovascular:  Negative for chest pain and leg swelling.  Gastrointestinal:  Negative for heartburn and nausea.  Musculoskeletal:  Positive for joint pain and myalgias.  Skin:  Negative for rash.  Neurological:  Positive for sensory change, focal weakness and weakness.  Psychiatric/Behavioral:  Positive for hallucinations.     Past Medical History:  Diagnosis Date   Allergy    Breast cancer ([place]) 08/25/2011   L , invasive ductal carcinoma, ER/PR +,HER2 -   Cancer ([place])    left breast cancer   Coronary artery disease 2001   Heart attack [facility]) 09/2000   Sep 25, 2000  --no intervention   History of chemotherapy comp. 08/22/2012   4 cycles of FEC and $ cycles of Taxotere   Hyperlipidemia    Hypertension    Hypothyroidism    PONV (postoperative nausea and vomiting)    gets sick from anesthesia   Seizures ([place])    Status post radiation therapy 07/09/12 - 08/22/2012   Left Breast, 60.4 gray    Past Surgical History:  Procedure Laterality Date   ABDOMINAL HYSTERECTOMY  1998   TAH, oophorectomy   APPENDECTOMY  1970   BREAST CYST EXCISION Left 07/29/2020   Procedure: WIDE EXCISION OF LEFT MASTECTOMY INCISION;  Surgeon: Donnie Mesa, MD;  Location: [place];  Service: General;  Laterality: Left;   [place]   removal of benign lump in rt breast   BREAST SURGERY  11/14/11   right simple mastectomy, left mrm   INCISION AND DRAINAGE OF WOUND Left 07/01/2017   Procedure: IRRIGATION AND DEBRIDEMENT CHEST WALL ABSCESS;  Surgeon: Donnie Mesa, MD;  Location: [facility];  Service: General;   Laterality: Left;   MASS EXCISION Left 06/22/2017   Procedure: EXCISION OF CHEST WALL MASSES;  Surgeon: Donnie Mesa, MD;  Location: [place];  Service: General;  Laterality: Left;   MASTECTOMY Bilateral    for left breast cancer   OVARIAN CYST SURGERY Right [place]  08/30/2012   Procedure: REMOVAL PORT-A-CATH;  Surgeon: Imogene Burn. [name], MD;  Location: [place];  Service: General;  Laterality: Right;  port removal   PORTACATH PLACEMENT  11/14/2011   Procedure: INSERTION PORT-A-CATH;  Surgeon: Imogene Burn. [name], MD;  Location: [place];  Service: General;  Laterality: Right;   PORTACATH PLACEMENT N/A 04/25/2016   Procedure: INSERTION PORT-A-CATH LEFT CHEST;  Surgeon: Donnie Mesa, MD;  Location: [place];  Service: General;  Laterality: N/A;   skin tags  05/09/1997   left axillary left neck skin tags   TONSILLECTOMY  1968    Family History  Problem Relation Age of Onset   Hypertension Maternal Grandmother    Diabetes Maternal Grandmother    Cancer Father 53       lung cancer and Prostate Cancer   Hypertension Mother    Cancer Paternal Aunt        ovarian   Cancer Cousin        breast, paternal cousin   Cancer Paternal Uncle        stomach   Cancer Paternal Grandfather        Esophagus   Colon cancer Neg Hx     Social History: Married. Was independent and ambulatory prior to seizures. Per  reports that she has never smoked. She has never used smokeless tobacco. She reports that she does not drink alcohol and does not use drugs.   Allergies  Allergen Reactions   Cantaloupe Extract Allergy Skin Test Shortness Of Breath   Contrast Media [Iodinated Contrast Media] Shortness Of Breath and Rash   Pravastatin Other (See Comments)    Legs hurt   Zosyn [Piperacillin Sod-Tazobactam So] Rash and Other (See Comments)    Temperature increase, facial flushing   Latex Rash    Redness, itch    Medications Prior to Admission  Medication Sig  Dispense Refill   bisacodyl (DULCOLAX) 5 MG EC tablet Take 1 tablet (5 mg total) by mouth daily as needed for moderate constipation. 30 tablet 0   dexamethasone (DECADRON) 4 MG tablet Take 1 tablet (4 mg total) by mouth daily. Start the day after chemotherapy for 2 days. 20 tablet 0   diphenhydrAMINE (BENADRYL) 25 MG tablet Take 25 mg by mouth at bedtime as needed for sleep.     furosemide (LASIX) 20 MG tablet TAKE ONE (1) TABLET BY MOUTH EVERY DAY (Patient taking differently: Take 20 mg by mouth daily.) 30 tablet 1   lacosamide (VIMPAT) 50 MG TABS tablet Take 1 tablet (50 mg total) by mouth every 12 (twelve) hours. 60 tablet    lactulose (CHRONULAC) 10 GM/15ML solution Take 15 mLs (10 g total) by mouth 2 (two) times daily. 236 mL 0   levETIRAcetam (KEPPRA) 1000  MG tablet Take 2 tablets (2,000 mg total) by mouth 2 (two) times daily.     levothyroxine (SYNTHROID) 100 MCG tablet TAKE ONE TABLET EACH MORNING BEFORE BREAKFAST (Patient taking differently: Take 100 mcg by mouth daily.) 90 tablet 3   lidocaine-prilocaine (EMLA) cream Apply 1 application topically daily as needed (port access).     LORazepam (ATIVAN) 1 MG tablet Take 1 tablet (1 mg total) by mouth 3 (three) times daily as needed for up to 8 doses for seizure (Take 1 tablet by mouth if you have recurrent episodes of left hand shaking.). 8 tablet 0   metoprolol tartrate (LOPRESSOR) 25 MG tablet Take 0.5 tablets (12.5 mg total) by mouth 2 (two) times daily.     Midazolam (NAYZILAM) 5 MG/0.1ML SOLN Place 5 mg into the nose daily as needed (seizure).     Multiple Vitamins-Minerals (MULTIVITAMIN WITH MINERALS) tablet Take 1 tablet by mouth daily.     ondansetron (ZOFRAN) 8 MG tablet Take 1 tablet (8 mg total) by mouth 2 (two) times daily as needed for refractory nausea / vomiting. Start on day 3 after chemo. 30 tablet 1   oxyCODONE-acetaminophen (PERCOCET/ROXICET) 5-325 MG tablet Take 1 tablet by mouth every 6 (six) hours as needed for moderate  pain. 30 tablet 0   pantoprazole (PROTONIX) 40 MG tablet Take 1 tablet (40 mg total) by mouth daily. (Patient not taking: Reported on 10/15/2021) 30 tablet 1   prochlorperazine (COMPAZINE) 10 MG tablet Take 1 tablet (10 mg total) by mouth every 6 (six) hours as needed (Nausea or vomiting). 30 tablet 1   traZODone (DESYREL) 50 MG tablet Take 1 tablet (50 mg total) by mouth at bedtime. (Patient taking differently: Take 50 mg by mouth at bedtime as needed for sleep.) 30 tablet 3    Drug Regimen Review  Drug regimen was reviewed and remains appropriate with no significant issues identified  Home: Home Living Family/patient expects to be discharged to:: Inpatient rehab Living Arrangements: Spouse/significant other Available Help at Discharge: Family, Available 24 hours/day Type of Home: House Home Access: Stairs to enter [employer] of Steps: 4 Entrance Stairs-Rails: Right, Left, Can reach both Home Layout: Two level, Able to live on main level with bedroom/bathroom, Full bath on main level Alternate Level Stairs-Number of Steps: 8 landing + 8 Alternate Level Stairs-Rails: Right, Left, Can reach both Bathroom Shower/Tub: Multimedia programmer: Standard Bathroom Accessibility: Yes Home Equipment: Wheelchair - manual, BSC/3in1, Conservation officer, nature (2 wheels) (gait belt)   Functional History: Prior Function Prior Level of Function : Needs assist Physical Assist : Mobility (physical), ADLs (physical) Mobility (physical): Bed mobility, Transfers, Gait, Stairs ADLs (physical): Grooming, Bathing, Dressing, Toileting, IADLs Mobility Comments: SPT to wheelchair; taking a couple steps at a time in the house ADLs Comments: family assist to sponge bathe, dress and do limited household chores like folding towels.   Functional Status:  Mobility: Bed Mobility Overal bed mobility: Needs Assistance Bed Mobility: Supine to Sit Supine to sit: Mod assist Sit to supine: Max  assist General bed mobility comments: Assist for LLE flexion and LUE reach for roll to the R. Bed pad utilized for full roll and to control trunk into sitting. Transfers Overall transfer level: Needs assistance Equipment used: Ambulation equipment used Transfers: Sit to/from Stand, Bed to chair/wheelchair/BSC Sit to Stand: +2 physical assistance, From elevated surface, Mod assist, Max assist Bed to/from chair/wheelchair/BSC transfer type:: Via Lift equipment  Lateral/Scoot Transfers: Mod assist, +2 physical assistance Transfer via Lift Equipment: [employer]  General transfer comment: Stedy utilized for bed>chair this session. Pt required +2 assist for power up to stand, and increased assist at the end to extend hips fully to get flaps of the Stedy in position for pt to sit. L lateral lean in sitting and standing required assist from therapist to hold midline posture during move to the chair. Ambulation/Gait General Gait Details: Unable to progress to ambulation this session.   ADL:   Cognition: Cognition Overall Cognitive Status: Impaired/Different from baseline Orientation Level: Oriented to person, Oriented to place, Oriented to situation Cognition Arousal/Alertness: Awake/alert Behavior During Therapy: Flat affect Overall Cognitive Status: Impaired/Different from baseline Area of Impairment: Orientation, Memory, Following commands, Safety/judgement, Awareness, Problem solving Orientation Level: Disoriented to, Time Memory: Decreased short-term memory Following Commands: Follows one step commands consistently, Follows one step commands with increased time, Follows multi-step commands inconsistently Safety/Judgement: Decreased awareness of deficits, Decreased awareness of safety Awareness: Intellectual Problem Solving: Slow processing, Decreased initiation, Difficulty sequencing, Requires verbal cues, Requires tactile cues General Comments: Pt confused at times about situation and need for  rehab. Reports she is going home tomorrow and her hair Hansel Feinstein is coming to the house to do her hair.  Blood pressure (!) 111/59, pulse 85, temperature 97.8 F (36.6 C), temperature source Oral, resp. rate 16, height 5' 8"  (1.727 m), weight 63.2 kg, SpO2 97 %. Physical Exam Vitals and nursing note reviewed.  Constitutional:      Appearance: Normal appearance.  Cardio: RRR Pulmonary: breathing comfortably Skin:    Findings: Bruising present.     Comments: Multiple petechiae bilateral forearm. Bilateral shins with stasis changes, bruising and skin tears. Sacrum with mild erythema and bony without padding.   Abdomen: NT, ND Neurological:     Mental Status: She is alert and oriented to person, place, and time.     Comments: Left inattention with sensory deficits LUE and LLE. Left hemiplegia with emerging tone LLE. Speech clear and able to follow simple motor commands. Lacks insight awareness of deficits.  Right side 5/5, LUE 3/5 SA/EE/EF, 4/5 hand grip, LLE 4/5 throughout. Decreased sensation  No results found. However, due to the size of the patient record, not all encounters were searched. Please check Results Review for a complete set of results. CT HIP LEFT WO CONTRAST  Result Date: 10/20/2021 CLINICAL DATA:  Possible left acetabular fracture. History of breast cancer. EXAM: CT OF THE LEFT HIP WITHOUT CONTRAST TECHNIQUE: Multidetector CT imaging of the left hip was performed according to the standard protocol. Multiplanar CT image reconstructions were also generated. COMPARISON:  Left hip x-rays from same day. CT chest, abdomen, and pelvis dated September 15, 2021. FINDINGS: Bones/Joint/Cartilage No acute fracture or dislocation. 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 Widespread sclerotic osseous metastases, not significantly changed. Unchanged mild to moderate left hip joint space narrowing with  marginal osteophytes. No joint effusion. Ligaments Ligaments are suboptimally evaluated by CT. Muscles and Tendons Grossly intact.  Gluteus minimus muscle atrophy. Soft tissue No fluid collection or hematoma.  No soft tissue mass. IMPRESSION: 1. No acute osseous abnormality. 2. 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 3. Widespread sclerotic osseous metastases, not significantly changed. Electronically Signed   By: Titus Dubin M.D.   On: 10/20/2021 14:32   DG HIP UNILAT WITH PELVIS 2-3 VIEWS LEFT  Result Date: 10/20/2021 CLINICAL DATA:  Bilateral hip pain right worse than  the left. History of breast cancer. EXAM: DG HIP (WITH OR WITHOUT PELVIS) 2-3V LEFT COMPARISON:  None. FINDINGS: There are multiple sclerotic densities overlying the pelvis and left hip highly suspicious for metastatic disease. There is a linear lucency projecting over the acetabulum and femoral head, findings are suspicious for acetabular fracture. IMPRESSION: 1. Multiple sclerotic densities overlying the pelvis and left hip concerning for metastatic disease. 2. Linear lucency projecting over the acetabulum and femoral head, suspicious for mildly displaced acetabular fracture. Cross-sectional imaging would be helpful for further evaluation. Electronically Signed   By: Keane Police D.O.   On: 10/20/2021 12:12   DG HIP UNILAT WITH PELVIS 2-3 VIEWS RIGHT  Result Date: 10/20/2021 CLINICAL DATA:  Bilateral hip pain. History of metastatic breast cancer. EXAM: DG HIP (WITH OR WITHOUT PELVIS) 2-3V RIGHT COMPARISON:  None. FINDINGS: There is no evidence of hip fracture or dislocation. Sclerotic densities are seen throughout the pelvis and visualized proximal femur and sacrum consistent with osseous metastatic disease. Moderate degenerative joint disease is seen involving both hips. IMPRESSION: Diffuse osseous metastatic disease as described above. 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 No fractures noted. Electronically Signed   By: Marijo Conception M.D.   On: 10/20/2021 11:47       Medical Problem List and Plan: 1. Functional deficits secondary to metastatic cancer  -patient may shower  -ELOS/Goals: 14-21 days  Admit to CIR 2.  Antithrombotics: -DVT/anticoagulation:  Mechanical: Sequential compression devices, below knee Bilateral lower extremities  -antiplatelet therapy: N/A 3. Pain Management: Oxycodone prn.  4. Mood: LCSW to follow for evaluation and support.   -antipsychotic agents: N/a 5. Neuropsych: This patient is capable of making decisions on her own behalf. 6. Skin/Wound Care: routine pressure relief measures.   --will order air mattress overlay to prevent breakdown. Prevalon boots for sore heels.   --aquacell with foam dressing to skin tears on bilateral shins.     7. Fluids/Electrolytes/Nutrition: Monitor I/O. Check lytes in am.  8. Focal status epilepticus:  Continue Keppra 2000 mg BID and Vimpat 50 mg BID.   --Vimpat 50 mg IV every 12 hours prn breakthrough and Ativan 2 mg IV prn seizure lasting more than 5 minutes. Contact neurology for breakthrough seizure.  9. Hepatic cirrhosis: Ammonia levels remain elevated 47-->58. Continue lactulose 10 mg bid.   --recheck ammonia level in am.   --continue to trend LFTs.  10. Breast cancer with extensive bony mets:discussed benefits of prehab for response to chemotherapy.  11. Cerebral mets: Continue decadron 4 mg daily.  12. Thrombocytopenia: Plts 92-->59-->51. Continue to trend H/H/plts.  --Will monitor for signs of bleeding.  13. HTN: monitor BP TID--continue lasix and metoprolol.    --metoprolol decreased 01/04 due to hypotension.  14. HLD: LDL 129 on 04/24/19: statin not recommended given elevated FLTs.   I have personally performed a face to face diagnostic evaluation, including, but not limited to relevant history and physical exam findings, of this patient and  developed relevant assessment and plan.  Additionally, I have reviewed and concur with the physician assistant's documentation above.  Reesa Chew, [place]  Izora Ribas, MD 10/20/2021

## 2021-10-20 NOTE — Discharge Summary (Addendum)
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 Patient presented to the ED on 12/29 at the direction of her neuro oncologist for evaluation of increased left-sided weakness, possible left hand twitching, and concern for Todd's paralysis versus occult status epilepticus.   Patient was recently hospitalized 12/19-12/20 for seizures with Todd's paralysis and was discharged on higher dose of Keppra and continued on Decadron.  She declined to go to SNF at that time and went home with her husband. At home, patient experienced progressive left-sided weakness to the point of inability to walk associated with increased leg swelling, confusion, hallucination.  She also had left arm twitching multiple times a day. On 12/29, she saw her neuro oncologist and was directed to the ED for further evaluation management   In the ED, patient was afebrile, hemodynamically stable, breathing on room air  Labs showed elevated LFTs MRI brain without contrast showed continued progression of extensive white matter vasogenic edema.    Admitted to hospitalist service  Neurology was consulted   Subjective: Patient was seen and examined this morning.  Sitting up in chair.  Not in distress.  Husband at bedside. She mentions that yesterday while she was getting a bath, she started hurting on the right side of her hip.  Patient and family wants to make sure that she does not have any bone metastasis.  I ordered for hip x-rays.  Assessment/Plan: Progressive neurological symptoms in the setting of brain metastasis  -Presented with increased left sided weakness, confusion, and left thumb twitching concerning for occult seizures -Noted on exam to have left-sided deficits. -MRI brain showed continued progression of extensive white matter festinate edema in the high right frontal lobe and portions of the right parietal lobe. 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  No definite seizure noted.  But it showed epileptogenicity and cortical dysfunction arising from right posterior quadrant likely secondary to underlying mass. -Per neurology recommendation, patient is currently on Keppra 2000 mg twice daily, Vimpat 50 mg twice daily. Continue seizure precautions. -Per neuro-oncology recommendation, to continue Decadron at 4 mg daily for now   Metastatic breast cancer  -left breast cancer (invasive ductal carcinoma, ER/PR +,HER2-) s/p mastectomy in 2013, history of chemotherapy and radiation therapy, local recurrence of breast cancer in 2017, breast cancer metastases to brain and several bones, s/p SRS in 2020, now on [place]   -1/3, case was discussed with oncologist Dr. Lindi Adie and Dr. Mickeal Skinner.  Recommended to prioritize rehab before the next chemotherapy.  So patient's chemotherapy will be  postponed till she is discharged from rehab.  Right hip pain -Patient mentions that  yesterday while she was getting a bath, she started hurting on the right side of her hip.  Patient and family wants to make sure that she does not have any bone metastasis.  I ordered for hip x-rays.  Pending report  Elevated liver enzymes -unclear how much of work-up has been done for this as an outpatient.  High likelihood of association with malignancy.  -Liver ultrasound on 12/30 showed increase in size of liver masses suggestive of worsening metastatic disease.  Also showed heterogeneous nodular liver worrisome for diffuse infiltrative disease including liver cirrhosis.  Ammonia level level was elevated.  Currently on scheduled lactulose to target 2-3 bowel movements a day. Recent Labs  Lab 10/15/21 0232 10/16/21 0148 10/17/21 0214 10/18/21 0318  AST 119* 104* 108* 124*  ALT 87* 78* 74* 83*  ALKPHOS 383* 373* 363* 375*  BILITOT 1.0 1.0 1.0 1.0  PROT 5.5* 5.5* 5.0* 5.4*  ALBUMIN 2.7* 2.6* 2.5* 2.5*   Recent Labs  Lab 10/16/21 0148 10/18/21 0319  AMMONIA 47* 58*   Essential hypertension -Home meds include 100 mg daily and Lasix 20 mg daily. -Currently continued on both.  If patient starts to get dehydrated because of multiple bowel movements, Lasix has to be switched to as needed.  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  Do not see any antiplatelets in her list probably because of thrombocytopenia. -Continue metoprolol.   Bilateral pedal edema -Pt complains of increased leg swelling over past couple weeks, no respiratory complaints or JVD, probably related to Decadron. -She had preserved EF on TTE from 09/27/21  -Albumin was 2.5 recently and she has been on Decadron  -Elevate legs, continue Lasix 20 mg daily   Thrombocytopenia  -Platelets 92k on admission.  Trending down.  No bleeding. Recent Labs  Lab 10/14/21 1153 10/15/21 0232 10/16/21 0148 10/17/21 0214 10/18/21 0318  PLT 92* 59* 51* 61* 51*    Hypothyroidism  -Continue Synthroid   Mobility: PT evaluation appreciated Living condition: Home Goals of care:   Code Status: DNR.   Nutritional status: Body mass index is 22.29 kg/m.      Discharge Medications:   Allergies as of 10/20/2021       Reactions   Cantaloupe Extract Allergy Skin Test Shortness Of Breath   Contrast Media [iodinated Contrast Media] Shortness Of Breath, Rash   Pravastatin Other (See Comments)   Legs hurt   Zosyn [piperacillin Sod-tazobactam So] Rash, Other (See Comments)   Temperature increase, facial flushing   Latex Rash   Redness, itch        Medication List     STOP taking these medications    acetaminophen 500 MG tablet Commonly known as: TYLENOL   metoprolol succinate 100 MG 24 hr tablet Commonly known as: TOPROL-XL   valsartan 80 MG tablet Commonly known as: DIOVAN       TAKE these medications    bisacodyl 5 MG EC tablet Commonly known as: DULCOLAX Take 1 tablet (5 mg total) by mouth daily as needed for moderate constipation.   dexamethasone 4 MG tablet Commonly known as: DECADRON Take 1 tablet (4 mg total) by mouth daily. Start the day after chemotherapy for 2 days.   diphenhydrAMINE 25 MG tablet Commonly known as: BENADRYL Take 25 mg by mouth at bedtime as needed for sleep.  furosemide 20 MG tablet Commonly known as: LASIX TAKE ONE (1) TABLET BY MOUTH EVERY DAY What changed: how much to take   lacosamide 50 MG Tabs tablet Commonly known as: VIMPAT Take 1 tablet (50 mg total) by mouth every 12 (twelve) hours.   lactulose 10 GM/15ML solution Commonly known as: CHRONULAC Take 15 mLs (10 g total) by mouth 2 (two) times daily.   levETIRAcetam 1000 MG tablet Commonly known as: KEPPRA Take 2 tablets (2,000 mg total) by mouth 2 (two) times daily.   levothyroxine 100 MCG tablet Commonly known as: SYNTHROID TAKE ONE TABLET EACH MORNING BEFORE BREAKFAST What changed: See the new instructions.    lidocaine-prilocaine cream Commonly known as: EMLA Apply 1 application topically daily as needed (port access).   LORazepam 1 MG tablet Commonly known as: Ativan Take 1 tablet (1 mg total) by mouth 3 (three) times daily as needed for up to 8 doses for seizure (Take 1 tablet by mouth if you have recurrent episodes of left hand shaking.).   metoprolol tartrate 25 MG tablet Commonly known as: LOPRESSOR Take 0.5 tablets (12.5 mg total) by mouth 2 (two) times daily.   multivitamin with minerals tablet Take 1 tablet by mouth daily.   Nayzilam 5 MG/0.1ML Soln Generic drug: Midazolam Place 5 mg into the nose daily as needed (seizure).   ondansetron 8 MG tablet Commonly known as: Zofran Take 1 tablet (8 mg total) by mouth 2 (two) times daily as needed for refractory nausea / vomiting. Start on day 3 after chemo.   oxyCODONE-acetaminophen 5-325 MG tablet Commonly known as: PERCOCET/ROXICET Take 1 tablet by mouth every 6 (six) hours as needed for moderate pain.   pantoprazole 40 MG tablet Commonly known as: Protonix Take 1 tablet (40 mg total) by mouth daily.   prochlorperazine 10 MG tablet Commonly known as: COMPAZINE Take 1 tablet (10 mg total) by mouth every 6 (six) hours as needed (Nausea or vomiting).   traZODone 50 MG tablet Commonly known as: DESYREL Take 1 tablet (50 mg total) by mouth at bedtime. What changed:  when to take this reasons to take this               Discharge Care Instructions  (From admission, onward)           Start     Ordered   10/20/21 0000  Discharge wound care:        10/20/21 1034            Wound care:   Incision (Closed) 07/29/20 Breast Left (Active)  Date First Assessed/Time First Assessed: 07/29/20 1257   Location: Breast  Location Orientation: Left    Assessments 07/29/2020  1:15 PM 07/29/2020  1:57 PM  Dressing Type Gauze (Comment);Paper tape --  Dressing Clean;Dry;Intact Clean;Dry;Intact  Site / Wound Assessment  Dressing in place / Unable to assess --  Drainage Amount None None     No Linked orders to display    Discharge Instructions:   Discharge Instructions     Call MD for:  difficulty breathing, headache or visual disturbances   Complete by: As directed    Call MD for:  extreme fatigue   Complete by: As directed    Call MD for:  hives   Complete by: As directed    Call MD for:  persistant dizziness or light-headedness   Complete by: As directed    Call MD for:  persistant nausea and vomiting   Complete by: As directed  Call MD for:  severe uncontrolled pain   Complete by: As directed    Call MD for:  temperature >100.4   Complete by: As directed    Diet general   Complete by: As directed    Discharge instructions   Complete by: As directed    General discharge instructions:  Follow with Primary MD [name], MD in 7 days   Get CBC/BMP checked in next visit within 1 week by PCP or SNF MD. (We routinely change or add medications that can affect your baseline labs and fluid status, therefore we recommend that you get the mentioned basic workup next visit with your PCP, your PCP may decide not to get them or add new tests based on their clinical decision)  On your next visit with your PCP, please get your medicines reviewed and adjusted.  Please request your PCP  to go over all hospital tests, procedures, radiology results at the follow up, please get all Hospital records sent to your PCP by signing hospital release before you go home.  Activity: As tolerated with Full fall precautions use walker/cane & assistance as needed  Avoid using any recreational substances like cigarette, tobacco, alcohol, or non-prescribed drug.  If you experience worsening of your admission symptoms, develop shortness of breath, life threatening emergency, suicidal or homicidal thoughts you must seek medical attention immediately by calling 911 or calling your MD immediately  if symptoms less  severe.  You must read complete instructions/literature along with all the possible adverse reactions/side effects for all the medicines you take and that have been prescribed to you. Take any new medicine only after you have completely understood and accepted all the possible adverse reactions/side effects.   Do not drive, operate heavy machinery, perform activities at heights, swimming or participation in water activities or provide baby sitting services if your were admitted for syncope or siezures until you have seen by Primary MD or a Neurologist and advised to do so again.  Do not drive when taking Pain medications.  Do not take more than prescribed Pain, Sleep and Anxiety Medications  Wear Seat belts while driving.  Please note You were cared for by a hospitalist during your hospital stay. If you have any questions about your discharge medications or the care you received while you were in the hospital after you are discharged, you can call the unit and asked to speak with the hospitalist on call if the hospitalist that took care of you is not available. Once you are discharged, your primary care physician will handle any further medical issues. Please note that NO REFILLS for any discharge medications will be authorized once you are discharged, as it is imperative that you return to your primary care physician (or establish a relationship with a primary care physician if you do not have one) for your aftercare needs so that they can reassess your need for medications and monitor your lab values.   Discharge wound care:   Complete by: As directed    Increase activity slowly   Complete by: As directed        Follow ups:    Follow-up Information     [name], MD Follow up.   Specialty: Family Medicine Contact information: [place] [address] [phone]         Minus Breeding, MD .   Specialty: Cardiology Contact information: [address] [place] [address] [phone]  Discharge Exam:   Vitals:   10/19/21 2131 10/19/21 2339 10/20/21 0410 10/20/21 0908  BP: 117/81 126/65 129/77 (!) 97/59  Pulse: 68 66 70 85  Resp:  _0 Temp:  97.9 F (36.6 C) 97.6 F (36.4 C) 98.4 F (36.9 C)  TempSrc:   Oral Oral  SpO2:  99% 100% 100%  Weight:      Height:        Body mass index is 22.29 kg/m.  General exam: Pleasant, elderly Caucasian female. Not in physical distress Skin: No rashes, lesions or ulcers. HEENT: Atraumatic, normocephalic, no obvious bleeding Lungs: Clear to auscultation bilaterally CVS: Regular rate and rhythm, no murmur GI/Abd soft, nontender, improved distention.  Bowel sound present CNS: Alert, awake, oriented x3. Psychiatry: Mood appropriate Extremities: Pedal edema improving bilaterally with chronic cellulitis  Time coordinating discharge: 35 minutes   The results of significant diagnostics from this hospitalization (including imaging, microbiology, ancillary and laboratory) are listed below for reference.    Procedures and Diagnostic Studies:   MR BRAIN WO CONTRAST  Result Date: 10/14/2021 CLINICAL DATA:  Seizures.  Metastatic breast cancer. EXAM: MRI HEAD WITHOUT CONTRAST TECHNIQUE: Multiplanar, multiecho pulse sequences of the brain and surrounding structures were obtained without intravenous contrast. COMPARISON:  CT head without contrast 10/04/2021. MR head without contrast 09/24/2021. MR head without and with contrast 09/03/2021 FINDINGS: Brain: Continued progression of extensive white matter vasogenic edema is present in the high right frontal lobe and portions of the parietal lobe. There is increasing mass effect with effacement of the sulci. Minimal effacement of the right lateral ventricle noted. No significant midline shift is present. Two areas of cortical susceptibility are again noted in the right frontal and parietal lobe corresponding with the areas of  enhancement. These areas are stable. No new foci of susceptibility are present. Focal T2 signal at the right temporal tip is stable. No significant white matter changes are present in the left hemisphere or cerebellum. Brainstem is unremarkable. T2 shine through in susceptibility is present without definite restricted diffusion. Basal ganglia are within normal limits. The internal auditory canals are unremarkable. No new discrete lesions are present. Vascular: Flow is present in the major intracranial arteries. Skull and upper cervical spine: Extensive osseous metastases are again noted. Sinuses/Orbits: The paranasal sinuses and mastoid air cells are clear. The globes and orbits are within normal limits. IMPRESSION: 1. Continued progression of extensive white matter vasogenic edema in the high right frontal lobe and portions of the right parietal lobe. PET scan 09/15/2021 demonstrated no increased activity relative to the adjacent areas in suggest tumor necrosis. Progressive white matter changes may reflect reactive disease rather than metastatic progression. The diffuse white matter progression is likely associated with the seizure activity. 2. Stable areas of cortical susceptibility in the right frontal and parietal lobe corresponding with the areas of enhancement. These represent areas of treated metastases demonstrating calcification on the CT scan. 3. Stable focal T2 signal at the right temporal tip. 4. No new foci of susceptibility to suggest metastatic disease. 5. Extensive osseous metastases. Electronically Signed   By: San Morelle M.D.   On: 10/14/2021 18:47   EEG adult  Result Date: 10/15/2021 Lora Havens, MD     10/15/2021  8:16 AM Patient Name: [name] Riley MRN: [id number] Epilepsy Attending: Lora Havens Referring Physician/Provider: Dr Kerney Elbe Date: 10/15/2021 Duration: 23.27 mins Patient history: 71 year old female with metastatic brain disease presented with seizure. EEG  to evaluate for seizure. Level  of alertness: Awake AEDs during EEG study: LEV Technical aspects: This EEG study was done with scalp electrodes positioned according to the 10-20 International system of electrode placement. Electrical activity was acquired at a sampling rate of 500Hz and reviewed with a high frequency filter of 70Hz and a low frequency filter of 1Hz. EEG data were recorded continuously and digitally stored. Description: The posterior dominant rhythm consists of 9 Hz activity of moderate voltage (25-35 uV) seen predominantly in posterior head regions, symmetric and reactive to eye opening and eye closing.  EEG showed continuous 5 to 6 Hz theta slowing in right posterior quadrant, maximal right parietal region. Spikes were also noted in right parieto-occipital region, maximal P4. Hyperventilation and photic stimulation were not performed.   ABNORMALITY - Spike, right parieto-occipital region, maximal P4. - Continuous slow, right posterior quadrant IMPRESSION: This study showed evidence of epileptogenicity arising from right parieto-occipital region. There is also cortical dysfunction arising from right posterior quadrant, likely secondary to underlying mass. No seizures were seen throughout the recording. Priyanka [name]   Overnight EEG with video  Result Date: 10/15/2021 Lora Havens, MD     10/16/2021  9:45 AM Patient Name: [name] Riley MRN: [id number] Epilepsy Attending: Lora Havens Referring Physician/Provider: Dr Kerney Elbe Duration: 10/15/2021 0435 to 10/16/2021 0435  Patient history: 71 year old female with metastatic brain disease presented with seizure. EEG to evaluate for seizure.  Level of alertness: Awake, asleep  AEDs during EEG study: LEV  Technical aspects: This EEG study was done with scalp electrodes positioned according to the 10-20 International system of electrode placement. Electrical activity was acquired at a sampling rate of 500Hz and reviewed with a high  frequency filter of 70Hz and a low frequency filter of 1Hz. EEG data were recorded continuously and digitally stored.  Description: The posterior dominant rhythm consists of 9 Hz activity of moderate voltage (25-35 uV) seen predominantly in posterior head regions, symmetric and reactive to eye opening and eye closing.  Sleep was characterized by vertex waves, sleep spindles (12 to 14 Hz), maximum frontocentral region. EEG showed continuous 5 to 6 Hz theta slowing in right posterior quadrant, maximal right parietal region. Spikes were also noted in right parieto-occipital region, maximal P4. Hyperventilation and photic stimulation were not performed.   One seizure without clinical signs was noted on 10/15/2021 at 0533. Concomitant EEG showed spikes in right posterior quadrant, maximal P4 which appeared rhythmic admixed with sharply contoured 6 to 9 Hz theta-alpha activity which gradually evolved into 3 to 4 Hz theta slowing.  Seizure lasted for about 7 minutes One seizure was noted on 10/15/2021 at 0721 during which patient was noted to have left arm twitching. Concomitant EEG showed spikes in right posterior quadrant, maximal P4 which appeared rhythmic admixed with sharply contoured 6 to 9 Hz theta-alpha activity which gradually evolved into 3 to 4 Hz theta slowing.  IV Ativan was administered at 0742. Seizure lasted for about  26 minutes  ABNORMALITY -Focal convulsive status epilepticus, right posterior quadrant - Spike, right parieto-occipital region, maximal P4. 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 IV Ativan was administered at 0742, seizure ended at 0747. One seizure without clinical signs was also noted on 10/15/2021 at 0533, lasting about 7 minutes. Additionally there is evidence of epileptogenicity arising from right parieto-occipital region. There is also  cortical dysfunction arising from right posterior quadrant, likely  secondary to underlying mass.  Priyanka [name]   US Abdomen Limited RUQ (LIVER/GB)  Result Date: 10/15/2021 CLINICAL DATA:  Elevated liver enzymes EXAM: ULTRASOUND ABDOMEN LIMITED RIGHT UPPER QUADRANT COMPARISON:  CT chest abdomen and pelvis 09/15/2021. FINDINGS: Gallbladder: Gallstones are present measuring up to 8 mm. There is gallbladder wall thickening measuring up to 4.6 mm. There is no pericholecystic fluid. No sonographic Murphy sign noted by sonographer. Common bile duct: Diameter: 3.5 mm. Liver: Liver parenchyma is diffusely heterogeneous with increased echogenicity. There are 3 masses identified in the right lobe. The largest measures 6.0 x 2.3 x 4.7 cm. The others measure 4.1 x 2.3 x 3.5 cm and 1.1 x 1.3 x 0.9 cm. Compared to the prior CT these have slightly increased in size given differences in technique. Portal vein is patent on color Doppler imaging with normal direction of blood flow towards the liver. Other: Small volume ascites. IMPRESSION: 1. Three liver masses have increased in size measuring up to 6 cm worrisome for worsening metastatic disease. 2. Heterogeneous nodular liver worrisome for diffuse hepatocellular disease including cirrhosis with nodular regeneration or more likely worsening metastatic disease. This can be better evaluated with MRI. 3. Small volume ascites. 4. Cholelithiasis. Electronically Signed   By: Ronney Asters M.D.   On: 10/15/2021 17:00     Labs:   Basic Metabolic Panel: Recent Labs  Lab 10/14/21 1153 10/15/21 0232 10/16/21 0148 10/17/21 0214 10/18/21 0318  NA 139 136 134* 136 137  K 4.0 4.6 3.8 3.7 4.0  CL 102 100 99 101 101  CO2 _0 GLUCOSE 90 82 84 94 97  BUN 27* 28* 21 26* 24*  CREATININE 0.67 0.66 0.67 0.82 0.63  CALCIUM 9.5 8.8* 8.4* 8.1* 8.3*   GFR Estimated Creatinine Clearance: 67.9 mL/min (by C-G formula based on SCr of 0.63 mg/dL). Liver Function  Tests: Recent Labs  Lab 10/15/21 0232 10/16/21 0148 10/17/21 0214 10/18/21 0318  AST 119* 104* 108* 124*  ALT 87* 78* 74* 83*  ALKPHOS 383* 373* 363* 375*  BILITOT 1.0 1.0 1.0 1.0  PROT 5.5* 5.5* 5.0* 5.4*  ALBUMIN 2.7* 2.6* 2.5* 2.5*   No results for input(s): LIPASE, AMYLASE in the last 168 hours. Recent Labs  Lab 10/16/21 0148 10/18/21 0319  AMMONIA 47* 58*   Coagulation profile No results for input(s): INR, PROTIME in the last 168 hours.  CBC: Recent Labs  Lab 10/14/21 1153 10/15/21 0232 10/16/21 0148 10/17/21 0214 10/18/21 0318  WBC 8.2 7.0 5.1 6.4 5.6  NEUTROABS 5.7  --   --   --  3.4  HGB 12.0 11.3* 11.5* 10.7* 11.4*  HCT 35.8* 34.5* 33.3* 31.1* 33.9*  MCV 107.5* 108.2* 104.4* 104.4* 105.9*  PLT 92* 59* 51* 61* 51*   Cardiac Enzymes: No results for input(s): CKTOTAL, CKMB, CKMBINDEX, TROPONINI in the last 168 hours. BNP: Invalid input(s): POCBNP CBG: No results for input(s): GLUCAP in the last 168 hours. D-Dimer No results for input(s): DDIMER in the last 72 hours. Hgb A1c No results for input(s): HGBA1C in the last 72 hours. Lipid Profile No results for input(s): CHOL, HDL, LDLCALC, TRIG, CHOLHDL, LDLDIRECT in the last 72 hours. Thyroid function studies No results for input(s): TSH, T4TOTAL, T3FREE, THYROIDAB in the last 72 hours.  Invalid input(s): FREET3 Anemia work up No results for input(s): VITAMINB12, FOLATE, FERRITIN, TIBC, IRON, RETICCTPCT in the last 72 hours. Microbiology No results found for this or any previous visit (from the past 240  hour(s)).   Signed: Terrilee Croak  [facility] 10/20/2021, 11:45 AM

## 2021-10-20 NOTE — TOC Transition Note (Signed)
Transition of Care [facility]) - CM/SW Discharge Note   Patient Details  Name: [name] Riley MRN: [id number] Date of Birth: 1950/11/26  Transition of Care [facility]) CM/SW Contact:  Pollie Friar, RN Phone Number: 10/20/2021, 10:40 AM   Clinical Narrative:    Patient is discharging to CIR today. CM signing off.    Final next level of care: IP Rehab Facility Barriers to Discharge: No Barriers Identified   Patient Goals and CMS Choice     Choice offered to / list presented to : Patient  Discharge Placement                       Discharge Plan and Services                                     Social Determinants of Health (SDOH) Interventions     Readmission Risk Interventions No flowsheet data found.

## 2021-10-20 NOTE — Progress Notes (Signed)
Inpatient Rehab Admissions Coordinator:   I have a bed for this patient to admit to CIR today. Dr. Pietro Cassis in agreement.  I will let pt/family and TOC team know.   Shann Medal, PT, DPT Admissions Coordinator [phone] 10/20/21  10:28 AM

## 2021-10-21 DIAGNOSIS — R7989 Other specified abnormal findings of blood chemistry: Secondary | ICD-10-CM

## 2021-10-21 DIAGNOSIS — G40909 Epilepsy, unspecified, not intractable, without status epilepticus: Secondary | ICD-10-CM | POA: Diagnosis not present

## 2021-10-21 DIAGNOSIS — G8194 Hemiplegia, unspecified affecting left nondominant side: Secondary | ICD-10-CM | POA: Diagnosis not present

## 2021-10-21 DIAGNOSIS — C50919 Malignant neoplasm of unspecified site of unspecified female breast: Secondary | ICD-10-CM | POA: Diagnosis not present

## 2021-10-21 LAB — COMPREHENSIVE METABOLIC PANEL
ALT: 92 U/L — ABNORMAL HIGH (ref 0–44)
AST: 124 U/L — ABNORMAL HIGH (ref 15–41)
Albumin: 2.5 g/dL — ABNORMAL LOW (ref 3.5–5.0)
Alkaline Phosphatase: 424 U/L — ABNORMAL HIGH (ref 38–126)
Anion gap: 6 (ref 5–15)
BUN: 20 mg/dL (ref 8–23)
CO2: 27 mmol/L (ref 22–32)
Calcium: 8.2 mg/dL — ABNORMAL LOW (ref 8.9–10.3)
Chloride: 103 mmol/L (ref 98–111)
Creatinine, Ser: 0.6 mg/dL (ref 0.44–1.00)
GFR, Estimated: >60 mL/min (ref >60)
Glucose, Bld: 82 mg/dL (ref 70–99)
Potassium: 3.9 mmol/L (ref 3.5–5.1)
Sodium: 136 mmol/L (ref 135–145)
Total Bilirubin: 1 mg/dL (ref 0.3–1.2)
Total Protein: 5.5 g/dL — ABNORMAL LOW (ref 6.5–8.1)

## 2021-10-21 LAB — CBC WITH DIFFERENTIAL/PLATELET
Abs Immature Granulocytes: 0.21 10*3/uL — ABNORMAL HIGH (ref 0.00–0.07)
Basophils Absolute: 0 10*3/uL (ref 0.0–0.1)
Basophils Relative: 0 %
Eosinophils Absolute: 0 10*3/uL (ref 0.0–0.5)
Eosinophils Relative: 0 %
HCT: 34.5 % — ABNORMAL LOW (ref 36.0–46.0)
Hemoglobin: 11.7 g/dL — ABNORMAL LOW (ref 12.0–15.0)
Immature Granulocytes: 3 %
Lymphocytes Relative: 24 %
Lymphs Abs: 1.6 10*3/uL (ref 0.7–4.0)
MCH: 35.3 pg — ABNORMAL HIGH (ref 26.0–34.0)
MCHC: 33.9 g/dL (ref 30.0–36.0)
MCV: 104.2 fL — ABNORMAL HIGH (ref 80.0–100.0)
Monocytes Absolute: 0.7 10*3/uL (ref 0.1–1.0)
Monocytes Relative: 11 %
Neutro Abs: 4.1 10*3/uL (ref 1.7–7.7)
Neutrophils Relative %: 62 %
Platelets: 57 10*3/uL — ABNORMAL LOW (ref 150–400)
RBC: 3.31 MIL/uL — ABNORMAL LOW (ref 3.87–5.11)
RDW: 13.7 % (ref 11.5–15.5)
WBC: 6.6 10*3/uL (ref 4.0–10.5)
nRBC: 0.3 % — ABNORMAL HIGH (ref 0.0–0.2)

## 2021-10-21 LAB — AMMONIA: Ammonia: 65 umol/L — ABNORMAL HIGH (ref 9–35)

## 2021-10-21 MED ORDER — SODIUM CHLORIDE 0.9 % IV SOLN
2000.0000 mg | Freq: Two times a day (BID) | INTRAVENOUS | Status: DC | PRN
  Filled 2021-10-21: qty 20

## 2021-10-21 MED ORDER — HEPARIN SOD (PORK) LOCK FLUSH 100 UNIT/ML IV SOLN
500.0000 [IU] | INTRAVENOUS | Status: DC
  Filled 2021-10-21: qty 5

## 2021-10-21 MED ORDER — LEVETIRACETAM 750 MG PO TABS
2000.0000 mg | ORAL_TABLET | Freq: Two times a day (BID) | ORAL | Status: DC
  Administered 2021-10-21 – 2021-10-28 (×15): 2000 mg via ORAL
  Filled 2021-10-21 (×17): qty 1

## 2021-10-21 MED ORDER — CHLORHEXIDINE GLUCONATE CLOTH 2 % EX PADS: 6.0000 | MEDICATED_PAD | Freq: Two times a day (BID) | CUTANEOUS | Status: DC

## 2021-10-21 MED ORDER — CHLORHEXIDINE GLUCONATE CLOTH 2 % EX PADS
6.0000 | MEDICATED_PAD | Freq: Every day | CUTANEOUS | Status: DC
  Administered 2021-10-21: 6 via TOPICAL

## 2021-10-21 MED ORDER — LACTULOSE 10 GM/15ML PO SOLN
20.0000 g | Freq: Two times a day (BID) | ORAL | Status: DC
  Administered 2021-10-21 – 2021-10-25 (×8): 20 g via ORAL
  Filled 2021-10-21 (×8): qty 30

## 2021-10-21 MED ORDER — HEPARIN SOD (PORK) LOCK FLUSH 100 UNIT/ML IV SOLN
500.0000 [IU] | INTRAVENOUS | Status: DC | PRN
  Administered 2021-10-21: 500 [IU]
  Filled 2021-10-21 (×2): qty 5

## 2021-10-21 MED ORDER — SODIUM CHLORIDE 0.9% FLUSH
10.0000 mL | INTRAVENOUS | Status: DC | PRN
  Administered 2021-10-26: 10 mL

## 2021-10-21 MED ORDER — LACTULOSE 10 GM/15ML PO SOLN
10.0000 g | Freq: Once | ORAL | Status: AC
  Administered 2021-10-21: 10 g via ORAL
  Filled 2021-10-21: qty 15

## 2021-10-21 MED ORDER — SODIUM CHLORIDE 0.9% FLUSH
10.0000 mL | Freq: Two times a day (BID) | INTRAVENOUS | Status: DC
  Administered 2021-10-26 – 2021-10-28 (×5): 10 mL

## 2021-10-21 NOTE — Progress Notes (Addendum)
Inpatient Rehabilitation Care Coordinator Assessment and Plan Patient Details  Name: [name] Riley MRN: [id number] Date of Birth: [birth year]  Today's Date: 10/21/2021  Hospital Problems: Principal Problem:   Breast cancer metastasized to brain [facility])  Past Medical History:  Past Medical History:  Diagnosis Date   Allergy    Breast cancer ([place]) 08/25/2011   L , invasive ductal carcinoma, ER/PR +,HER2 -   Cancer ([place])    left breast cancer   Coronary artery disease 2001   Heart attack [facility]) 09/2000   Sep 25, 2000  --no intervention   History of chemotherapy comp. 08/22/2012   4 cycles of FEC and $ cycles of Taxotere   Hyperlipidemia    Hypertension    Hypothyroidism    PONV (postoperative nausea and vomiting)    gets sick from anesthesia   Seizures ([place])    Status post radiation therapy 07/09/12 - 08/22/2012   Left Breast, 60.4 gray   Past Surgical History:  Past Surgical History:  Procedure Laterality Date   ABDOMINAL HYSTERECTOMY  1998   TAH, oophorectomy   APPENDECTOMY  1970   BREAST CYST EXCISION Left 07/29/2020   Procedure: WIDE EXCISION OF LEFT MASTECTOMY INCISION;  Surgeon: Donnie Mesa, MD;  Location: [place];  Service: General;  Laterality: Left;   [place]   removal of benign lump in rt breast   BREAST SURGERY  11/14/11   right simple mastectomy, left mrm   INCISION AND DRAINAGE OF WOUND Left 07/01/2017   Procedure: IRRIGATION AND DEBRIDEMENT CHEST WALL ABSCESS;  Surgeon: Donnie Mesa, MD;  Location: [facility];  Service: General;  Laterality: Left;   MASS EXCISION Left 06/22/2017   Procedure: EXCISION OF CHEST WALL MASSES;  Surgeon: Donnie Mesa, MD;  Location: [place];  Service: General;  Laterality: Left;   MASTECTOMY Bilateral    for left breast cancer   OVARIAN CYST SURGERY Right [place]  08/30/2012   Procedure: REMOVAL PORT-A-CATH;  Surgeon: Imogene Burn. [name], MD;  Location: [place];  Service:  General;  Laterality: Right;  port removal   PORTACATH PLACEMENT  11/14/2011   Procedure: INSERTION PORT-A-CATH;  Surgeon: Imogene Burn. [name], MD;  Location: [place];  Service: General;  Laterality: Right;   PORTACATH PLACEMENT N/A 04/25/2016   Procedure: INSERTION PORT-A-CATH LEFT CHEST;  Surgeon: Donnie Mesa, MD;  Location: [place];  Service: General;  Laterality: N/A;   skin tags  05/09/1997   left axillary left neck skin tags   TONSILLECTOMY  1968   Social History:  reports that she has never smoked. She has never used smokeless tobacco. She reports that she does not drink alcohol and does not use drugs.  Family / Support Systems Marital Status: Married How Long?: 4 years Patient Roles: Spouse, Parent Spouse/Significant Other: Vernard Gambles (husband): [phone] Children: 3 adult children; 2 livining -Kristen and Mitzi Hansen; son Corene Cornea passed in 2015 Other Supports: grandchildren Anticipated Caregiver: Husband Vernard Gambles, son Mitzi Hansen and grandson Milta Deiters Ability/Limitations of Caregiver: None reported Caregiver Availability: 24/7 Family Dynamics: Pt lives with her husband, and son Mitzi Hansen recently moved in to help assist.  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 Health Literacy - How often do you need to have someone help you when you read instructions, pamphlets, or other written material from your doctor or pharmacy?: Never Writes: Yes Employment Status: Retired Public relations account executive  Issues: Denies Guardian/Conservator: N/A   Abuse/Neglect Abuse/Neglect Assessment Can Be Completed: Yes Physical Abuse: Denies Verbal Abuse: Denies Sexual Abuse: Denies Exploitation of patient/patient's resources: Denies Self-Neglect: Denies  Patient response to: Social Isolation - How often do you feel lonely or isolated from those around you?:  Never  Emotional Status Pt's affect, behavior and adjustment status: Pt in good spirits at time of visit Recent Psychosocial Issues: Denies Psychiatric History: Denies Substance Abuse History: Denies  Patient / Family Perceptions, Expectations & Goals Pt/Family understanding of illness & functional limitations: Pt and family have a general understanding of patient care needs Premorbid pt/family roles/activities: some assistance with ADLs/IADLs Anticipated changes in roles/activities/participation: continued assistance required Pt/family expectations/goals: Pt goal is to work on "walking/mobility."  [employer]: None Premorbid Home Care/DME Agencies: None Transportation available at discharge: TBD Is the patient able to respond to transportation needs?: Yes In the past 12 months, has lack of transportation kept you from medical appointments or from getting medications?: No In the past 12 months, has lack of transportation kept you from meetings, work, or from getting things needed for daily living?: No Resource referrals recommended: Neuropsychology  Discharge Planning Living Arrangements: Spouse/significant other, Children, Other relatives Support Systems: Spouse/significant other, Children, Other relatives Type of Residence: Private residence Insurance Resources: [employer], Multimedia programmer (specify) Oncologist Supp Income) Museum/gallery curator Resources: Radio broadcast assistant Screen Referred: No Living Expenses: Own Money Management: Spouse Does the patient have any problems obtaining your medications?: No Home Management: Pt reports her grandson Milta Deiters helps with preparing meals, and housecleaning is often completed by husband, grandson Milta Deiters and [employer] Care Coordinator Anticipated Follow Up Needs: HH/OP  Clinical Impression SW met with pt in room to introduce self, explain role, and discuss discharge process. Pt is not a English as a second language teacher. Pt repots  redoing HCPOA. Pt aware SW to follow-up with her husband Vernard Gambles.   1131am- SW spoke with pt husband Vernard Gambles to introduce self, explain role, and discuss discharge process. SW emailed [employer] information as he reported pt lost her billfold prior to admission. SW will f/u after team conference.   Pt is active with [facility] for PT/OT/HHA and will need resumption orders; new orders if new services added.  Rana Snare 10/21/2021, 12:36 PM

## 2021-10-21 NOTE — Evaluation (Signed)
Occupational Therapy Assessment and Plan  Patient Details  Name: [name] Riley MRN: [id number] Date of Birth: 12-19-1950  OT Diagnosis: cognitive deficits, hemiplegia affecting non-dominant side, and muscle weakness (generalized) Rehab Potential: Rehab Potential (ACUTE ONLY): Good ELOS: 2.5-3 weeks   Today's Date: 10/21/2021 OT Individual Time: 6237-6283 OT Individual Time Calculation (min): 55 min     Hospital Problem: Principal Problem:   Breast cancer metastasized to brain [facility])   Past Medical History:  Past Medical History:  Diagnosis Date   Allergy    Breast cancer ([place]) 08/25/2011   L , invasive ductal carcinoma, ER/PR +,HER2 -   Cancer ([place])    left breast cancer   Coronary artery disease 2001   Heart attack [facility]) 09/2000   Sep 25, 2000  --no intervention   History of chemotherapy comp. 08/22/2012   4 cycles of FEC and $ cycles of Taxotere   Hyperlipidemia    Hypertension    Hypothyroidism    PONV (postoperative nausea and vomiting)    gets sick from anesthesia   Seizures ([place])    Status post radiation therapy 07/09/12 - 08/22/2012   Left Breast, 60.4 gray   Past Surgical History:  Past Surgical History:  Procedure Laterality Date   ABDOMINAL HYSTERECTOMY  1998   TAH, oophorectomy   APPENDECTOMY  1970   BREAST CYST EXCISION Left 07/29/2020   Procedure: WIDE EXCISION OF LEFT MASTECTOMY INCISION;  Surgeon: Donnie Mesa, MD;  Location: [place];  Service: General;  Laterality: Left;   [place]   removal of benign lump in rt breast   BREAST SURGERY  11/14/11   right simple mastectomy, left mrm   INCISION AND DRAINAGE OF WOUND Left 07/01/2017   Procedure: IRRIGATION AND DEBRIDEMENT CHEST WALL ABSCESS;  Surgeon: Donnie Mesa, MD;  Location: [facility];  Service: General;  Laterality: Left;   MASS EXCISION Left 06/22/2017   Procedure: EXCISION OF CHEST WALL MASSES;  Surgeon: Donnie Mesa, MD;  Location: [place];  Service: General;   Laterality: Left;   MASTECTOMY Bilateral    for left breast cancer   OVARIAN CYST SURGERY Right [place]  08/30/2012   Procedure: REMOVAL PORT-A-CATH;  Surgeon: Imogene Burn. [name], MD;  Location: [place];  Service: General;  Laterality: Right;  port removal   PORTACATH PLACEMENT  11/14/2011   Procedure: INSERTION PORT-A-CATH;  Surgeon: Imogene Burn. [name], MD;  Location: [place];  Service: General;  Laterality: Right;   PORTACATH PLACEMENT N/A 04/25/2016   Procedure: INSERTION PORT-A-CATH LEFT CHEST;  Surgeon: Donnie Mesa, MD;  Location: [place];  Service: General;  Laterality: N/A;   skin tags  05/09/1997   left axillary left neck skin tags   TONSILLECTOMY  1968    Assessment & Plan Clinical Impression: Patient is a 71 y.o. year old female with  history of Left breast cancer 08/2011 with recurrence and mets to lungs, right kidney, R-sternum/scapula/ribs/spine/R-femur/skull as well as right frontal,  parietal and temporal lobe with edema and new onset seizures with breakthrough seizures and significant decline. She was admitted on 10/14/21 per Dr. Renda Rolls input for management/assessment of increase in left sided weakness with hallucinations and twitching of left hand. She was placed on LTM EEG due to concerns of refractory post ictal Todd's paralysis v/c occult focal status epilepticus. She was found to have one seizure without clinical signs as well as LUE twitching due to focal convulsive status epilepticus with encephalopathy.  She was treated with IV ativan and started on Vimpat due to breakthrough seizures.    She was noted to have elevated liver enzymes with distended abdomen and BLE edema---> Liver ultrasound showed increase in size of liver mets and diffuse hepatocellular disease--concerns of cirrhosis v/s worsening of metastasis.  Ammonia level noted to be elevated and she was started on lactulose.  Follow up MRI brain done w/contrast on 01/01  showing right cerebral lesions in right frontal lobe with increased c/w 11/18 with vasogenic edema.  Seizures have resolved with recommendations to increase Vimpat to 100 mg bid prn breakthrough episodes, keep decadron to 4 mg and Keppra to 2000 mg bid. Confusion has resolved and mentation back to baseline. She continues to have Left> right sided weakness. Therapy has been ongoing and patient noted to be limited by fatigue, weakness with left lateral lean with inability to maintain midline posture at EOB and standing attempts with LLE blocked. She did report increase in left> right hip pain with activity.  X rays done today revealing multiple sclerotic densities in pelvis and left hip with mildly displaced L-acetabular fracture.  CT pelvisPatient transferred to CIR on 10/20/2021 .    Patient currently requires total with basic self-care skills secondary to muscle weakness, decreased cardiorespiratoy endurance, impaired timing and sequencing, abnormal tone, unbalanced muscle activation, ataxia, decreased coordination, and decreased motor planning, decreased visual acuity, decreased midline orientation, decreased attention to left, left side neglect, and decreased motor planning, decreased initiation, decreased attention, decreased awareness, decreased problem solving, decreased safety awareness, decreased memory, and delayed processing, and decreased sitting balance, decreased standing balance, decreased postural control, hemiplegia, and decreased balance strategies.  Prior to hospitalization, patient could complete  with min.  Patient will benefit from skilled intervention to decrease level of assist with basic self-care skills prior to discharge home with care partner.  Anticipate patient will require minimal physical assistance and follow up home health.  OT - End of Session Endurance Deficit: Yes Endurance Deficit Description: Multiiple rest breaks within BADL tasks OT Assessment Rehab Potential (ACUTE  ONLY): Good OT Patient demonstrates impairments in the following area(s): Balance;Cognition;Endurance;Edema;Motor;Pain;Perception;Safety;Sensory;Skin Integrity;Vision OT Basic ADL's Functional Problem(s): Eating;Grooming;Dressing;Bathing;Toileting OT Transfers Functional Problem(s): Toilet;Tub/Shower OT Additional Impairment(s): Fuctional Use of Upper Extremity OT Plan OT Intensity: Minimum of 1-2 x/day, 45 to 90 minutes OT Frequency: 5 out of 7 days OT Duration/Estimated Length of Stay: 2.5-3 weeks OT Treatment/Interventions: Balance/vestibular training;Cognitive remediation/compensation;Community reintegration;Discharge planning;Disease mangement/prevention;DME/adaptive equipment instruction;Functional electrical stimulation;Functional mobility training;Neuromuscular re-education;Pain management;Self Care/advanced ADL retraining;Patient/family education;Psychosocial support;Skin care/wound managment;Splinting/orthotics;Therapeutic Activities;Therapeutic Exercise;UE/LE Strength taining/ROM;UE/LE Coordination activities;Visual/perceptual remediation/compensation;Wheelchair propulsion/positioning OT Self Feeding Anticipated Outcome(s): Set-up OT Basic Self-Care Anticipated Outcome(s): Min A OT Toileting Anticipated Outcome(s): Min A OT Bathroom Transfers Anticipated Outcome(s): Min A OT Recommendation Recommendations for Other Services: Neuropsych consult Patient destination: Home Follow Up Recommendations: Home health OT;24 hour supervision/assistance (vs. no OT follow up pending goals of care) Equipment Recommended: To be determined   OT Evaluation Precautions/Restrictions  Precautions Precautions: Fall Precaution Comments: L side weakness Restrictions Weight Bearing Restrictions: No Pain  Right groin pain, no number given. Rest and repostiioned Home Living/Prior [place] expects to be discharged to:: Private residence Living Arrangements:  Spouse/significant other, Children, Other relatives Available Help at Discharge: Family, Available 24 hours/day Type of Home: House Home Access: Stairs to enter [employer] of Steps: 4 Entrance Stairs-Rails: Right, Left, Can reach both Home Layout: Two level, Able to live on main level with bedroom/bathroom, Full bath on main level Alternate Level  Stairs-Number of Steps: 8 landing + 8 Alternate Level Stairs-Rails: Right, Left, Can reach both Bathroom Shower/Tub: Multimedia programmer: Standard Bathroom Accessibility: Yes Additional Comments: has a wheelchair, shower, chair, wheelchair, walker  Lives With: Spouse Biomedical scientist and girlfriend) IADL History IADL Comments: Helps pastor a church in [place], enjoys coloring, spending time with family, hallmark movies, crafts, gardening Prior Function Level of Independence: Needs assistance with ADLs, Needs assistance with homemaking Vision Baseline Vision/History: 1 Wears glasses Ability to See in Adequate Light: 1 Impaired Patient Visual Report: Blurring of vision Vision Assessment?: Vision impaired- to be further tested in functional context Perception  Perception: Impaired Inattention/Neglect: Other (comment) (L Sided Inattention) Praxis Praxis: Impaired Praxis Impairment Details: Motor planning Cognition Overall Cognitive Status: Impaired/Different from baseline Arousal/Alertness: Awake/alert Orientation Level: Person;Place;Situation Person: Oriented Place: Oriented Situation: Oriented Year: 2021 Month: January Day of Week: Incorrect Memory: Impaired Memory Impairment: Storage deficit;Decreased recall of new information;Decreased short term memory Decreased Short Term Memory: Functional basic;Verbal basic Immediate Memory Recall: Sock;Blue;Bed Memory Recall Sock: Without Cue Memory Recall Blue: Without Cue Memory Recall Bed: With Cue Attention: Sustained Sustained Attention: Impaired Sustained Attention  Impairment: Verbal basic;Functional basic Awareness: Impaired Awareness Impairment: Intellectual impairment Problem Solving: Impaired Problem Solving Impairment: Verbal basic;Functional basic Executive Function:  (all impaired due to lower level deficits) Safety/Judgment: Impaired Sensation Sensation Light Touch: Impaired by gross assessment Additional Comments: Correctly identifies light touch on L but reports diminshed sensatoin Coordination Gross Motor Movements are Fluid and Coordinated: No Fine Motor Movements are Fluid and Coordinated: No Coordination and Movement Description: decreased smoothness and accuray with L hand, able to achieve some grasp-not functional Motor  Motor Motor: Hemiplegia Motor - Skilled Clinical Observations: L sided weakness  Trunk/Postural Assessment  Cervical Assessment Cervical Assessment:  (forward head) Thoracic Assessment Thoracic Assessment:  (rounded shoulders) Lumbar Assessment Lumbar Assessment:  (posterior pelvic tilt) Postural Control Postural Control: Deficits on evaluation (delayed and inadequate)  Balance Balance Balance Assessed: Yes Static Sitting Balance Static Sitting - Balance Support: Feet supported;Right upper extremity supported Static Sitting - Level of Assistance: 4: Min assist Dynamic Sitting Balance Dynamic Sitting - Balance Support: Right upper extremity supported;Feet supported Dynamic Sitting - Level of Assistance: 3: Mod assist Static Standing Balance Static Standing - Balance Support: Right upper extremity supported;During functional activity Static Standing - Level of Assistance: 1: +1 Total assist Extremity/Trunk Assessment RUE Assessment RUE Assessment: Within Functional Limits LUE Assessment LUE Assessment: Exceptions to [facility] LUE Body System: Neuro Brunstrum levels for arm and hand: Hand;Arm Brunstrum level for arm: Stage III Synergy is performed voluntarily Brunstrum level for hand: Stage III Synergies  performed voluntarily  Care Tool Care Tool Self Care Eating   Eating Assist Level: Minimal Assistance - Patient > 75%    Oral Care    Oral Care Assist Level: Minimal Assistance - Patient > 75%    Bathing   Body parts bathed by patient: Left arm;Chest;Abdomen;Front perineal area;Right upper leg;Face Body parts bathed by helper: Right arm;Buttocks;Left lower leg;Right lower leg;Left upper leg   Assist Level: Total Assistance - Patient < 25%    Upper Body Dressing(including orthotics)   What is the patient wearing?: Pull over shirt   Assist Level: Total Assistance - Patient < 25%    Lower Body Dressing (excluding footwear)   What is the patient wearing?: Incontinence brief;Pants Assist for lower body dressing: 2 Helpers    Putting on/Taking off footwear   What is the patient wearing?: Non-skid slipper socks Assist for footwear: Dependent -  Patient 0%       Care Tool Toileting Toileting activity Toileting Activity did not occur (Clothing management and hygiene only): N/A (no void or bm)       Care Tool Bed Mobility Roll left and right activity   Roll left and right assist level: Maximal Assistance - Patient 25 - 49%    Sit to lying activity   Sit to lying assist level: Maximal Assistance - Patient 25 - 49%    Lying to sitting on side of bed activity   Lying to sitting on side of bed assist level: the ability to move from lying on the back to sitting on the side of the bed with no back support.: Total Assistance - Patient < 25%     Care Tool Transfers Sit to stand transfer   Sit to stand assist level: Total Assistance - Patient < 25%    Chair/bed transfer   Chair/bed transfer assist level: Total Assistance - Patient < 25%     Toilet transfer Toilet transfer activity did not occur: Safety/medical concerns Assist Level: Total Assistance - Patient < 25%     Care Tool Cognition  Expression of Ideas and Wants Expression of Ideas and Wants: 3. Some difficulty -  exhibits some difficulty with expressing needs and ideas (e.g, some words or finishing thoughts) or speech is not clear  Understanding Verbal and Non-Verbal Content Understanding Verbal and Non-Verbal Content: 3. Usually understands - understands most conversations, but misses some part/intent of message. Requires cues at times to understand   Memory/Recall Ability Memory/Recall Ability : Current season;Location of own room;That he or she is in a hospital/hospital unit   Refer to Care Plan for [place] 1 OT Short Term Goal 1 (Week 1): Patient will tolerate sitting EOB for 10 minutes in preparation for BADL taks OT Short Term Goal 2 (Week 1): Pt will use L UE as a stabilizer with moderate assistance within BADL task OT Short Term Goal 3 (Week 1): Patient will complete toilet transfer with max A of 2 and LRAD  Recommendations for other services: Neuropsych   Skilled Therapeutic Intervention Pt greeted semi-reclined in bed and agreeable to OT eval and treat. OT eval completed addressing rehab process, OT purpose, POC, ELOS, and goals. Pt preformed functional BADL tasks at bed level with max/total A +2 overall-see details below. Total A to come to sitting EOB. Pt able to achieve sitting balance with mod A progressing to min-CGA while needing max A for UB bathing/dressing. Sit<>stand in [place] with total A +2 and bed raised. Nurse tech changed out bed to air mattress with pt having\ more pain in L groin in standing. Pt with strong push to the Lperched on stedy seat and puhing herself over requring max-total A to maintain position on [place]. Pt unable to lift up from perched seat on stedy at all requiring total A to transition back to bed from [place]. Pt left semi-reclined in bed with needs met.   ADL ADL Eating: Minimal assistance Grooming: Minimal assistance Upper Body Bathing: Maximal assistance Lower Body Bathing: Dependent Upper Body Dressing: Maximal assistance Lower  Body Dressing: Dependent Toileting: Dependent Toilet Transfer: Unable to assess (safety/medical) Tub/Shower Transfer: Unable to assess Mobility  Bed Mobility Bed Mobility: Supine to Sit;Sit to Supine Supine to Sit: Total Assistance - Patient < 25% Sit to Supine: Total Assistance - Patient < 25% Transfers Sit to Stand: Total Assistance - Patient < 25% Stand to Sit: Total  Assistance - Patient < 25%   Discharge Criteria: Patient will be discharged from OT if patient refuses treatment 3 consecutive times without medical reason, if treatment goals not met, if there is a change in medical status, if patient makes no progress towards goals or if patient is discharged from hospital.  The above assessment, treatment plan, treatment alternatives and goals were discussed and mutually agreed upon: by patient  Valma Cava 10/21/2021, 4:29 PM

## 2021-10-21 NOTE — Evaluation (Signed)
Speech Language Pathology Assessment and Plan  Patient Details  Name: [name] Riley MRN: [id number] Date of Birth: 1951/08/28  SLP Diagnosis: Cognitive Impairments  Rehab Potential: Good ELOS: 2-3 weeks    Today's Date: 10/21/2021 SLP Individual Time: 5701-7793 SLP Individual Time Calculation (min): 10 min   Hospital Problem: Principal Problem:   Breast cancer metastasized to brain [facility])  Past Medical History:  Past Medical History:  Diagnosis Date   Allergy    Breast cancer ([place]) 08/25/2011   L , invasive ductal carcinoma, ER/PR +,HER2 -   Cancer ([place])    left breast cancer   Coronary artery disease 2001   Heart attack [facility]) 09/2000   Sep 25, 2000  --no intervention   History of chemotherapy comp. 08/22/2012   4 cycles of FEC and $ cycles of Taxotere   Hyperlipidemia    Hypertension    Hypothyroidism    PONV (postoperative nausea and vomiting)    gets sick from anesthesia   Seizures ([place])    Status post radiation therapy 07/09/12 - 08/22/2012   Left Breast, 60.4 gray   Past Surgical History:  Past Surgical History:  Procedure Laterality Date   ABDOMINAL HYSTERECTOMY  1998   TAH, oophorectomy   APPENDECTOMY  1970   BREAST CYST EXCISION Left 07/29/2020   Procedure: WIDE EXCISION OF LEFT MASTECTOMY INCISION;  Surgeon: Donnie Mesa, MD;  Location: [place];  Service: General;  Laterality: Left;   [place]   removal of benign lump in rt breast   BREAST SURGERY  11/14/11   right simple mastectomy, left mrm   INCISION AND DRAINAGE OF WOUND Left 07/01/2017   Procedure: IRRIGATION AND DEBRIDEMENT CHEST WALL ABSCESS;  Surgeon: Donnie Mesa, MD;  Location: [facility];  Service: General;  Laterality: Left;   MASS EXCISION Left 06/22/2017   Procedure: EXCISION OF CHEST WALL MASSES;  Surgeon: Donnie Mesa, MD;  Location: [place];  Service: General;  Laterality: Left;   MASTECTOMY Bilateral    for left breast cancer   OVARIAN CYST SURGERY Right [place]  08/30/2012   Procedure: REMOVAL PORT-A-CATH;  Surgeon: Imogene Burn. [name], MD;  Location: [place];  Service: General;  Laterality: Right;  port removal   PORTACATH PLACEMENT  11/14/2011   Procedure: INSERTION PORT-A-CATH;  Surgeon: Imogene Burn. [name], MD;  Location: [place];  Service: General;  Laterality: Right;   PORTACATH PLACEMENT N/A 04/25/2016   Procedure: INSERTION PORT-A-CATH LEFT CHEST;  Surgeon: Donnie Mesa, MD;  Location: [place];  Service: General;  Laterality: N/A;   skin tags  05/09/1997   left axillary left neck skin tags   TONSILLECTOMY  1968    Assessment / Plan / Recommendation Clinical Impression Patient is a 71 year old female with history of Left breast cancer 08/2011 with recurrence and mets to lungs, right kidney, R-sternum/scapula/ribs/spine/R-femur/skull as well as right frontal,  parietal and temporal lobe with edema and new onset seizures with breakthrough seizures and significant decline. She was admitted on 10/14/21 per Dr. Renda Rolls input for management/assessment of increase in left sided weakness with hallucinations and twitching of left hand. She was placed on LTM EEG due to concerns of refractory post ictal Todd's paralysis v/c occult focal status epilepticus. She was found to have one seizure without clinical signs as well as LUE twitching due to focal convulsive status epilepticus with encephalopathy. She was treated with IV ativan and started on Vimpat due to  breakthrough seizures. She was noted to have elevated liver enzymes with distended abdomen and BLE edema---> Liver ultrasound showed increase in size of liver mets and diffuse hepatocellular disease--concerns of cirrhosis v/s worsening of metastasis.  Follow up MRI brain done w/contrast on 01/01 showing right cerebral lesions in right frontal lobe with increased c/w 11/18 with vasogenic edema.  Seizures have resolved. CT for further evaluation of bilateral hip  pain showed no evidence of fracture.  Therapy ongoing and patient recommended for CIR. Patient admitted 10/20/21.  Patient demonstrates moderate cognitive impairments impacting sustained attention, functional problem solving, recall of daily information and emergent awareness. During informal evaluation, patient with decreased intellectual awareness of current deficits, orientation to time, decreased sustained attention and decreased functional problem solving during functional tasks. SLP also administered the [facility] Mental Status Examination (SLUMS). Patient scored  14/30 points with a score of 27 or above considered normal with deficits in short-term recall, problem solving and attention. Patient's verbal expression and auditory comprehension were [facility] for all tasks assessed. Patient would benefit from skilled SLP intervention to maximize her cognitive functioning and overall functional independence prior to discharge.      Skilled Therapeutic Interventions          Administered a cognitive-linguistic evaluation and please see above for details. SLP provided the patient with a calendar to maximize orientation to date. Patient required Mod verbal and visual cues for problem solving and utilization due to decreased attention to left field of environment. Patient left upright in bed with alarm on and all needs within reach. Continue with current plan of care.    SLP Assessment  Patient will need skilled [place] [facility] Services during CIR admission    Recommendations  Oral Care Recommendations: Oral care BID Recommendations for Other Services: Neuropsych consult Patient destination: Home Follow up Recommendations: Home Health SLP;24 hour supervision/assistance Equipment Recommended: None recommended by SLP    SLP Frequency 3 to 5 out of 7 days   SLP Duration  SLP Intensity  SLP Treatment/Interventions 2-3 weeks  Minumum of 1-2 x/day, 30 to 90 minutes  Cognitive  remediation/compensation;Internal/external aids;Therapeutic Activities;Environmental controls;Cueing hierarchy;Functional tasks;Patient/family education    Pain Pain Assessment Pain Scale: 0-10 Pain Score: 6  Pain Location: Hip Pain Orientation: Left Pain Intervention(s): Medication (See eMAR)  Prior Functioning Type of Home: House  Lives With: Spouse Yolanda Bonine and girlfriend) Available Help at Discharge: Family;Available 24 hours/day  SLP Evaluation Cognition Overall Cognitive Status: Impaired/Different from baseline Arousal/Alertness: Awake/alert Orientation Level: Oriented X4 Attention: Sustained Sustained Attention: Impaired Sustained Attention Impairment: Verbal basic;Functional basic Memory: Impaired Memory Impairment: Storage deficit;Decreased recall of new information;Decreased short term memory Decreased Short Term Memory: Functional basic;Verbal basic Awareness: Impaired Awareness Impairment: Intellectual impairment Problem Solving: Impaired Problem Solving Impairment: Verbal basic;Functional basic Executive Function:  (all impaired due to lower level deficits) Safety/Judgment: Impaired  Comprehension Auditory Comprehension Overall Auditory Comprehension: Appears within functional limits for tasks assessed Expression Expression Primary Mode of Expression: Verbal Verbal Expression Overall Verbal Expression: Appears within functional limits for tasks assessed Written Expression Dominant Hand: Right Written Expression: Not tested Oral Motor Oral Motor/Sensory Function Overall Oral Motor/Sensory Function: Within functional limits Motor Speech Overall Motor Speech: Appears within functional limits for tasks assessed  Care Tool Care Tool Cognition Ability to hear (with hearing aid or hearing appliances if normally used Ability to hear (with hearing aid or hearing appliances if normally used): 1. Minimal difficulty - difficulty in some environments (e.g. when  person speaks softly or  setting is noisy)   Expression of Ideas and Wants Expression of Ideas and Wants: 3. Some difficulty - exhibits some difficulty with expressing needs and ideas (e.g, some words or finishing thoughts) or speech is not clear   Understanding Verbal and Non-Verbal Content Understanding Verbal and Non-Verbal Content: 3. Usually understands - understands most conversations, but misses some part/intent of message. Requires cues at times to understand  Memory/Recall Ability Memory/Recall Ability : Current season;Location of own room;That he or she is in a hospital/hospital unit    Short Term Goals: Week 1: SLP Short Term Goal 1 (Week 1): Patient will utilize external aids to recall new, daily information with Mod A multimodal cues. SLP Short Term Goal 2 (Week 1): Patient will demonstrate functional problem solving for basic and familiar tasks with Mod A verbal cues. SLP Short Term Goal 3 (Week 1): Patient will attend to left field of enviornment during functional tasks with Mod A multimodal cues. SLP Short Term Goal 4 (Week 1): Patient will demonstrate sustianed attention to functional tasks for 30 minutes with Min verbal cues for redirection. SLP Short Term Goal 5 (Week 1): Patient will self-monitor and correct errors during functional tasks with Mod A verbal cues.  Refer to Care Plan for Long Term Goals  Recommendations for other services: Neuropsych  Discharge Criteria: Patient will be discharged from SLP if patient refuses treatment 3 consecutive times without medical reason, if treatment goals not met, if there is a change in medical status, if patient makes no progress towards goals or if patient is discharged from hospital.  The above assessment, treatment plan, treatment alternatives and goals were discussed and mutually agreed upon: by patient  [name] Riley 10/21/2021, 3:14 PM

## 2021-10-21 NOTE — Plan of Care (Signed)
Problem: RH Balance Goal: LTG: Patient will maintain dynamic sitting balance (OT) Description: LTG:  Patient will maintain dynamic sitting balance with assistance during activities of daily living (OT) Flowsheets (Taken 10/21/2021 1609) LTG: Pt will maintain dynamic sitting balance during ADLs with: Supervision/Verbal cueing Goal: LTG Patient will maintain dynamic standing with ADLs (OT) Description: LTG:  Patient will maintain dynamic standing balance with assist during activities of daily living (OT)  Flowsheets (Taken 10/21/2021 1609) LTG: Pt will maintain dynamic standing balance during ADLs with: Minimal Assistance - Patient > 75%   Problem: RH Balance Goal: LTG Patient will maintain dynamic standing with ADLs (OT) Description: LTG:  Patient will maintain dynamic standing balance with assist during activities of daily living (OT)  Flowsheets (Taken 10/21/2021 1609) LTG: Pt will maintain dynamic standing balance during ADLs with: Minimal Assistance - Patient > 75%   Problem: Sit to Stand Goal: LTG:  Patient will perform sit to stand in prep for activites of daily living with assistance level (OT) Description: LTG:  Patient will perform sit to stand in prep for activites of daily living with assistance level (OT) Flowsheets (Taken 10/21/2021 1609) LTG: PT will perform sit to stand in prep for activites of daily living with assistance level: Minimal Assistance - Patient > 75%   Problem: RH Eating Goal: LTG Patient will perform eating w/assist, cues/equip (OT) Description: LTG: Patient will perform eating with assist, with/without cues using equipment (OT) Flowsheets (Taken 10/21/2021 1609) LTG: Pt will perform eating with assistance level of: Set up assist    Problem: RH Grooming Goal: LTG Patient will perform grooming w/assist,cues/equip (OT) Description: LTG: Patient will perform grooming with assist, with/without cues using equipment (OT) Flowsheets (Taken 10/21/2021 1609) LTG: Pt will  perform grooming with assistance level of: Set up assist    Problem: RH Bathing Goal: LTG Patient will bathe all body parts with assist levels (OT) Description: LTG: Patient will bathe all body parts with assist levels (OT) Flowsheets (Taken 10/21/2021 1609) LTG: Pt will perform bathing with assistance level/cueing: Minimal Assistance - Patient > 75%   Problem: RH Dressing Goal: LTG Patient will perform upper body dressing (OT) Description: LTG Patient will perform upper body dressing with assist, with/without cues (OT). Flowsheets (Taken 10/21/2021 1609) LTG: Pt will perform upper body dressing with assistance level of: Minimal Assistance - Patient > 75% Goal: LTG Patient will perform lower body dressing w/assist (OT) Description: LTG: Patient will perform lower body dressing with assist, with/without cues in positioning using equipment (OT) Flowsheets (Taken 10/21/2021 1609) LTG: Pt will perform lower body dressing with assistance level of: Minimal Assistance - Patient > 75%   Problem: RH Toileting Goal: LTG Patient will perform toileting task (3/3 steps) with assistance level (OT) Description: LTG: Patient will perform toileting task (3/3 steps) with assistance level (OT)  Flowsheets (Taken 10/21/2021 1609) LTG: Pt will perform toileting task (3/3 steps) with assistance level: Minimal Assistance - Patient > 75%   Problem: RH Functional Use of Upper Extremity Goal: LTG Patient will use RT/LT upper extremity as a (OT) Description: LTG: Patient will use right/left upper extremity as a stabilizer/gross assist/diminished/nondominant/dominant level with assist, with/without cues during functional activity (OT) Flowsheets (Taken 10/21/2021 1609) LTG: Use of upper extremity in functional activities: LUE as gross assist level LTG: Pt will use upper extremity in functional activity with assistance level of: Minimal Assistance - Patient > 75%   Problem: RH Toilet Transfers Goal: LTG Patient will  perform toilet transfers w/assist (OT) Description: LTG: Patient will  perform toilet transfers with assist, with/without cues using equipment (OT) Flowsheets (Taken 10/21/2021 1609) LTG: Pt will perform toilet transfers with assistance level of: Minimal Assistance - Patient > 75%   Problem: RH Attention Goal: LTG Patient will demonstrate this level of attention during functional activites (OT) Description: LTG:  Patient will demonstrate this level of attention during functional activites  (OT) Flowsheets (Taken 10/21/2021 1609) Patient will demonstrate above attention level in the following environment: Home LTG: Patient will demonstrate this level of attention during functional activites (OT): Minimal Assistance - Patient > 75%   Problem: RH Awareness Goal: LTG: Patient will demonstrate awareness during functional activites type of (OT) Description: LTG: Patient will demonstrate awareness during functional activites type of (OT) Flowsheets (Taken 10/21/2021 1609) LTG: Patient will demonstrate awareness during functional activites type of (OT): Minimal Assistance - Patient > 75%

## 2021-10-21 NOTE — Progress Notes (Addendum)
PROGRESS NOTE   Subjective/Complaints: Pt reports a reasonable night. Air mattress helped with pain. Left hip bothers her most. Both lower legs wrapped this am. Nurse reports urine retention  ROS: Patient denies fever, rash, sore throat, blurred vision, nausea, vomiting, diarrhea, cough, shortness of breath or chest pain,  headache, or mood change.    Objective:   CT HIP LEFT WO CONTRAST  Result Date: 10/20/2021 CLINICAL DATA:  Possible left acetabular fracture. History of breast cancer. EXAM: CT OF THE LEFT HIP WITHOUT CONTRAST TECHNIQUE: Multidetector CT imaging of the left hip was performed according to the standard protocol. Multiplanar CT image reconstructions were also generated. COMPARISON:  Left hip x-rays from same day. CT chest, abdomen, and pelvis dated September 15, 2021. FINDINGS: Bones/Joint/Cartilage No acute fracture or dislocation. 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 Widespread sclerotic osseous metastases, not significantly changed. Unchanged mild to moderate left hip joint space narrowing with marginal osteophytes. No joint effusion. Ligaments Ligaments are suboptimally evaluated by CT. Muscles and Tendons Grossly intact.  Gluteus minimus muscle atrophy. Soft tissue No fluid collection or hematoma.  No soft tissue mass. IMPRESSION: 1. No acute osseous abnormality. 2. 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 3. Widespread sclerotic osseous metastases, not significantly changed. Electronically Signed   By: Titus Dubin M.D.   On: 10/20/2021 14:32   DG HIP UNILAT WITH PELVIS 2-3 VIEWS LEFT  Result Date: 10/20/2021 CLINICAL DATA:  Bilateral hip pain right worse than the left. History of breast cancer. EXAM: DG HIP (WITH OR WITHOUT PELVIS) 2-3V LEFT  COMPARISON:  None. FINDINGS: There are multiple sclerotic densities overlying the pelvis and left hip highly suspicious for metastatic disease. There is a linear lucency projecting over the acetabulum and femoral head, findings are suspicious for acetabular fracture. IMPRESSION: 1. Multiple sclerotic densities overlying the pelvis and left hip concerning for metastatic disease. 2. Linear lucency projecting over the acetabulum and femoral head, suspicious for mildly displaced acetabular fracture. Cross-sectional imaging would be helpful for further evaluation. Electronically Signed   By: Keane Police D.O.   On: 10/20/2021 12:12   DG HIP UNILAT WITH PELVIS 2-3 VIEWS RIGHT  Result Date: 10/20/2021 CLINICAL DATA:  Bilateral hip pain. History of metastatic breast cancer. EXAM: DG HIP (WITH OR WITHOUT PELVIS) 2-3V RIGHT COMPARISON:  None. FINDINGS: There is no evidence of hip fracture or dislocation. Sclerotic densities are seen throughout the pelvis and visualized proximal femur and sacrum consistent with osseous metastatic disease. Moderate degenerative joint disease is seen involving both hips. IMPRESSION: Diffuse osseous metastatic disease as described above. 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 No fractures noted. Electronically Signed   By: Marijo Conception M.D.   On: 10/20/2021 11:47   Recent Labs    10/21/21 0606  WBC 6.6  HGB 11.7*  HCT 34.5*  PLT 57*   Recent Labs    10/21/21 0606  NA 136  K 3.9  CL 103  CO2 27  GLUCOSE 82  BUN 20  CREATININE 0.60  CALCIUM 8.2*    Intake/Output  Summary (Last 24 hours) at 10/21/2021 1030 Last data filed at 10/21/2021 0451 Gross per 24 hour  Intake 90 ml  Output 1500 ml  Net -1410 ml        Physical Exam: Vital Signs Blood pressure 123/72, pulse 66, temperature 97.6 F (36.4 C), resp. rate 16, height 5\' 8"  (1.727 m), weight 63.2 kg, SpO2 99 %.  General: Alert and oriented x 3, No apparent distress HEENT: Head is  normocephalic, atraumatic, PERRLA, EOMI, sclera anicteric, oral mucosa pink and moist, dentition intact, ext ear canals clear,  Neck: Supple without JVD or lymphadenopathy Heart: Reg rate and rhythm. No murmurs rubs or gallops Chest: CTA bilaterally without wheezes, rales, or rhonchi; no distress Abdomen: Soft, non-tender, non-distended, bowel sounds positive. Extremities: No clubbing, cyanosis, or edema. Pulses are 2+ Psych: Pt's affect is appropriate. Pt is cooperative Skin: ecchymoses and skin tears/wounds bilateral shins, stasis changes. A few other scattered bruises Neuro:  Pt is alert, oriented to hospital, why she's here, month. Follows commands. Provides some biographical information. No focal CN findings. RUE 5/5. LUE 2-3/5 prox to distal. RLE 4-5/5. LLE 4/5. Mild left sided sensory deficits and inattention. Early flexor tone LUE Musculoskeletal: pain in left hip with PROM.     Assessment/Plan: 1. Functional deficits which require 3+ hours per day of interdisciplinary therapy in a comprehensive inpatient rehab setting. Physiatrist is providing close team supervision and 24 hour management of active medical problems listed below. Physiatrist and rehab team continue to assess barriers to discharge/monitor patient progress toward functional and medical goals  Care Tool:  Bathing              Bathing assist       Upper Body Dressing/Undressing Upper body dressing        Upper body assist      Lower Body Dressing/Undressing Lower body dressing      What is the patient wearing?: Incontinence brief     Lower body assist Assist for lower body dressing: Total Assistance - Patient < 25%     Toileting Toileting Toileting Activity did not occur (Clothing management and hygiene only): N/A (no void or bm)  Toileting assist Assist for toileting: Total Assistance - Patient < 25%     Transfers Chair/bed transfer  Transfers assist            Locomotion Ambulation   Ambulation assist              Walk 10 feet activity   Assist           Walk 50 feet activity   Assist           Walk 150 feet activity   Assist           Walk 10 feet on uneven surface  activity   Assist           Wheelchair     Assist               Wheelchair 50 feet with 2 turns activity    Assist            Wheelchair 150 feet activity     Assist          Blood pressure 123/72, pulse 66, temperature 97.6 F (36.4 C), resp. rate 16, height 5\' 8"  (1.727 m), weight 63.2 kg, SpO2 99 %.  Medical Problem List and Plan: 1. Functional deficits secondary to metastatic cancer             -  patient may shower             -ELOS/Goals: 14-21 days             -Patient is beginning CIR therapies today including PT, OT, and SLP  2.  Antithrombotics: -DVT/anticoagulation:  Mechanical: Sequential compression devices, below knee Bilateral lower extremities             -antiplatelet therapy: N/A 3. Pain Management: Oxycodone prn.  4. Mood: LCSW to follow for evaluation and support.              -antipsychotic agents: N/a 5. Neuropsych: This patient is capable of making decisions on her own behalf. 6. Skin/Wound Care: routine pressure relief measures.              --continue air mattress overlay to prevent breakdown. Prevalon boots for sore heels.              --xeroform, 4x4, kerlix to areas for protection. Appreciate WOC RN note      1/5 areas look similar today 7. Fluids/Electrolytes/Nutrition: Monitor I/O. Check lytes in am.  8. Focal status epilepticus:  Continue Keppra 2000 mg BID and Vimpat 50 mg BID.   -seizure free thus far on rehab             --Vimpat 50 mg IV every 12 hours prn breakthrough and Ativan 2 mg IV prn seizure lasting more than 5 minutes.  -Contact neurology for breakthrough seizure.  9. Hepatic cirrhosis: Ammonia levels remain elevated 47-->58. Continue lactulose 10 mg bid.               -1/5 both LFT's and ammonia level with further increase today              -increase lactoluse to 20mg  bid 10. Breast cancer with extensive bony mets:discussed benefits of prehab for response to chemotherapy.   -multiple mets on imaging of pelvis 11. Cerebral mets: Continue decadron 4 mg daily.  12. Thrombocytopenia: Plts 92-->59-->51.    --1/5 plts up to 57k today---follow serially 13. HTN: monitor BP TID--continue lasix and metoprolol.               --metoprolol decreased 01/04 due to hypotension.  14. HLD: LDL 129 on 04/24/19: statin not recommended given elevated FLTs.  15. Urine retention:  -check ua, ucx  -oob to empty  -consider urecholine trial    LOS: 1 days A FACE TO FACE EVALUATION WAS PERFORMED  Meredith Staggers 10/21/2021, 10:30 AM

## 2021-10-21 NOTE — Evaluation (Signed)
Physical Therapy Assessment and Plan  Patient Details  Name: [name] Riley MRN: [id number] Date of Birth: [birth year]  PT Diagnosis: Difficulty walking, Hemiplegia non-dominant, Impaired cognition, Impaired sensation, and Muscle weakness Rehab Potential: Fair ELOS: 2-3 Weeks   Today's Date: 10/21/2021 PT Individual Time: 1105-1200 PT Individual Time Calculation (min): 55 min    Hospital Problem: Principal Problem:   Breast cancer metastasized to brain [facility])   Past Medical History:  Past Medical History:  Diagnosis Date   Allergy    Breast cancer ([place]) 08/25/2011   L , invasive ductal carcinoma, ER/PR +,HER2 -   Cancer ([place])    left breast cancer   Coronary artery disease 2001   Heart attack [facility]) 09/2000   Sep 25, 2000  --no intervention   History of chemotherapy comp. 08/22/2012   4 cycles of FEC and $ cycles of Taxotere   Hyperlipidemia    Hypertension    Hypothyroidism    PONV (postoperative nausea and vomiting)    gets sick from anesthesia   Seizures ([place])    Status post radiation therapy 07/09/12 - 08/22/2012   Left Breast, 60.4 gray   Past Surgical History:  Past Surgical History:  Procedure Laterality Date   ABDOMINAL HYSTERECTOMY  1998   TAH, oophorectomy   APPENDECTOMY  1970   BREAST CYST EXCISION Left 07/29/2020   Procedure: WIDE EXCISION OF LEFT MASTECTOMY INCISION;  Surgeon: Donnie Mesa, MD;  Location: [place];  Service: General;  Laterality: Left;   [place]   removal of benign lump in rt breast   BREAST SURGERY  11/14/11   right simple mastectomy, left mrm   INCISION AND DRAINAGE OF WOUND Left 07/01/2017   Procedure: IRRIGATION AND DEBRIDEMENT CHEST WALL ABSCESS;  Surgeon: Donnie Mesa, MD;  Location: [facility];  Service: General;  Laterality: Left;   MASS EXCISION Left 06/22/2017   Procedure: EXCISION OF CHEST WALL MASSES;  Surgeon: Donnie Mesa, MD;  Location: [place];  Service: General;  Laterality: Left;   MASTECTOMY  Bilateral    for left breast cancer   OVARIAN CYST SURGERY Right [place]  08/30/2012   Procedure: REMOVAL PORT-A-CATH;  Surgeon: Imogene Burn. [name], MD;  Location: [place];  Service: General;  Laterality: Right;  port removal   PORTACATH PLACEMENT  11/14/2011   Procedure: INSERTION PORT-A-CATH;  Surgeon: Imogene Burn. [name], MD;  Location: [place];  Service: General;  Laterality: Right;   PORTACATH PLACEMENT N/A 04/25/2016   Procedure: INSERTION PORT-A-CATH LEFT CHEST;  Surgeon: Donnie Mesa, MD;  Location: [place];  Service: General;  Laterality: N/A;   skin tags  05/09/1997   left axillary left neck skin tags   TONSILLECTOMY  1968    Assessment & Plan Clinical Impression: Patient is a 71 year old female with history of Left breast cancer 08/2011 with recurrence and mets to lungs, right kidney, R-sternum/scapula/ribs/spine/R-femur/skull as well as right frontal,  parietal and temporal lobe with edema and new onset seizures with breakthrough seizures and significant decline. She was admitted on 10/14/21 per Dr. Renda Rolls input for management/assessment of increase in left sided weakness with hallucinations and twitching of left hand. She was placed on LTM EEG due to concerns of refractory post ictal Todd's paralysis v/c occult focal status epilepticus. She was found to have one seizure without clinical signs as well as LUE twitching due to focal convulsive status epilepticus with encephalopathy. She was treated with IV ativan  and started on Vimpat due to breakthrough seizures.    She was noted to have elevated liver enzymes with distended abdomen and BLE edema---> Liver ultrasound showed increase in size of liver mets and diffuse hepatocellular disease--concerns of cirrhosis v/s worsening of metastasis.  Ammonia level noted to be elevated and she was started on lactulose.  Follow up MRI brain done w/contrast on 01/01 showing right cerebral lesions in  right frontal lobe with increased c/w 11/18 with vasogenic edema.  Seizures have resolved with recommendations to increase Vimpat to 100 mg bid prn breakthrough episodes, keep decadron to 4 mg and Keppra to 2000 mg bid. Confusion has resolved and mentation back to baseline. She continues to have Left> right sided weakness. Therapy has been ongoing and patient noted to be limited by fatigue, weakness with left lateral lean with inability to maintain midline posture at EOB and standing attempts with LLE blocked. She did report increase in left> right hip pain with activity.  X rays done today revealing multiple sclerotic densities in pelvis and left hip with mildly displaced L-acetabular fracture.  Patient transferred to CIR on 10/20/2021 .   Patient currently requires max with mobility secondary to muscle weakness, decreased cardiorespiratoy endurance, impaired timing and sequencing, decreased coordination, and decreased motor planning, decreased attention to left, decreased awareness, decreased problem solving, decreased safety awareness, and decreased memory, and decreased sitting balance, decreased standing balance, decreased postural control, hemiplegia, and decreased balance strategies.  Prior to hospitalization, patient was utilizing a WC for mobility and lived with Spouse ([place] and girlfriend) in a House home.  Home access is 4Stairs to enter.  Patient will benefit from skilled PT intervention to maximize safe functional mobility, minimize fall risk, and decrease caregiver burden for planned discharge home with 24 hour assist.  Anticipate patient will benefit from follow up [facility] at discharge.  PT - End of Session Activity Tolerance: Tolerates 10 - 20 min activity with multiple rests Endurance Deficit: Yes PT Assessment Rehab Potential (ACUTE/IP ONLY): Fair PT Barriers to Discharge: Home environment access/layout;Pending chemo/radiation PT Patient demonstrates impairments in the following area(s):  Balance;Motor;Pain;Perception;Safety;Sensory;Skin Integrity;Endurance PT Transfers Functional Problem(s): Bed Mobility;Bed to Chair;Car;Furniture PT Locomotion Functional Problem(s): Ambulation;Wheelchair Mobility;Stairs PT Plan PT Intensity: Minimum of 1-2 x/day ,45 to 90 minutes PT Frequency: 5 out of 7 days PT Duration Estimated Length of Stay: 2-3 Weeks PT Treatment/Interventions: Ambulation/gait training;Community reintegration;Neuromuscular re-education;Psychosocial support;Stair training;UE/LE Strength taining/ROM;Wheelchair propulsion/positioning;Balance/vestibular training;Pain management;Functional electrical stimulation;Discharge planning;Skin care/wound management;Therapeutic Activities;UE/LE Coordination activities;Cognitive remediation/compensation;Disease management/prevention;Functional mobility training;Patient/family education;Splinting/orthotics;Therapeutic Exercise;Visual/perceptual remediation/compensation;DME/adaptive equipment instruction PT Transfers Anticipated Outcome(s): MinA PT Locomotion Anticipated Outcome(s): TBD, pending progress PT Recommendation Follow Up Recommendations: Home health PT;24 hour supervision/assistance Patient destination: Home Equipment Recommended: To be determined   PT Evaluation Precautions/Restrictions Precautions Precautions: Fall Precaution Comments: L side weakness Restrictions Weight Bearing Restrictions: No General Chart Reviewed: Yes Family/Caregiver Present: No  Pain Interference Pain Interference Pain Effect on Sleep: 2. Occasionally Pain Interference with Therapy Activities: 3. Frequently Pain Interference with Day-to-Day Activities: 3. Frequently Home Living/Prior Functioning Home Living Living Arrangements: Spouse/significant other;Children;Other relatives Available Help at Discharge: Family;Available 24 hours/day Type of Home: House Home Access: Stairs to enter [employer] of Steps: 4 Entrance  Stairs-Rails: Right;Left;Can reach both Home Layout: Two level;Able to live on main level with bedroom/bathroom;Full bath on main level Alternate Level Stairs-Number of Steps: 8 landing + 8 Additional Comments: has a wheelchair, shower, chair, wheelchair, walker  Lives With: Spouse Biomedical scientist and girlfriend) Vision/Perception  Perception Perception: Impaired Inattention/Neglect: Other (comment) (L  Sided Inattention) Praxis Praxis: Impaired Praxis Impairment Details: Motor planning  Cognition Overall Cognitive Status: Impaired/Different from baseline Arousal/Alertness: Awake/alert Orientation Level: Oriented X4 Attention: Sustained Sustained Attention: Impaired Sustained Attention Impairment: Verbal basic;Functional basic Memory: Impaired Memory Impairment: Storage deficit;Decreased recall of new information;Decreased short term memory Decreased Short Term Memory: Functional basic;Verbal basic Awareness: Impaired Awareness Impairment: Intellectual impairment Problem Solving: Impaired Problem Solving Impairment: Verbal basic;Functional basic Executive Function:  (all impaired due to lower level deficits) Safety/Judgment: Impaired Sensation Sensation Light Touch: Impaired by gross assessment Additional Comments: Correctly identifies light touch on L but reports diminshed sensatoin Coordination Gross Motor Movements are Fluid and Coordinated: No Fine Motor Movements are Fluid and Coordinated: No Motor  Motor Motor: Hemiplegia Motor - Skilled Clinical Observations: L sided weakness  Trunk/Postural Assessment  Cervical Assessment Cervical Assessment:  (forward head) Thoracic Assessment Thoracic Assessment:  (rounded shoulders) Lumbar Assessment Lumbar Assessment:  (posterior pelvic tilt) Postural Control Postural Control: Deficits on evaluation (delayed and inadequate)  Balance Balance Balance Assessed: Yes Static Sitting Balance Static Sitting - Balance Support: Feet  supported;Right upper extremity supported Static Sitting - Level of Assistance: 4: Min assist Dynamic Sitting Balance Dynamic Sitting - Balance Support: Right upper extremity supported;Feet supported Dynamic Sitting - Level of Assistance: 3: Mod assist Static Standing Balance Static Standing - Balance Support: Right upper extremity supported;During functional activity Static Standing - Level of Assistance: 1: +1 Total assist Extremity Assessment  RLE Assessment General Strength Comments: Grossly 4/5 LLE Assessment General Strength Comments: Grossly 2+/5. Weaker proximally than distally.  Care Tool Care Tool Bed Mobility Roll left and right activity   Roll left and right assist level: Maximal Assistance - Patient 25 - 49%    Sit to lying activity   Sit to lying assist level: Maximal Assistance - Patient 25 - 49%    Lying to sitting on side of bed activity   Lying to sitting on side of bed assist level: the ability to move from lying on the back to sitting on the side of the bed with no back support.: Maximal Assistance - Patient 25 - 49%     Care Tool Transfers Sit to stand transfer   Sit to stand assist level: Total Assistance - Patient < 25%    Chair/bed transfer   Chair/bed transfer assist level: Total Assistance - Patient < 25%     Toilet transfer   Assist Level: Total Assistance - Patient < 25%    Scientist, product/process development transfer activity did not occur: Safety/medical concerns        Care Tool Locomotion Ambulation Ambulation activity did not occur: Safety/medical concerns        Walk 10 feet activity Walk 10 feet activity did not occur: Safety/medical concerns       Walk 50 feet with 2 turns activity Walk 50 feet with 2 turns activity did not occur: Safety/medical concerns      Walk 150 feet activity Walk 150 feet activity did not occur: Safety/medical concerns      Walk 10 feet on uneven surfaces activity Walk 10 feet on uneven surfaces activity did not occur:  Safety/medical concerns      Stairs Stair activity did not occur: Safety/medical concerns        Walk up/down 1 step activity Walk up/down 1 step or curb (drop down) activity did not occur: Safety/medical concerns      Walk up/down 4 steps activity Walk up/down 4 steps activity did not occur: Safety/medical concerns  Walk up/down 12 steps activity Walk up/down 12 steps activity did not occur: Safety/medical concerns      Pick up small objects from floor Pick up small object from the floor (from standing position) activity did not occur: Safety/medical concerns      Wheelchair Is the patient using a wheelchair?: Yes Type of Wheelchair: Manual   Wheelchair assist level: Dependent - Patient 0% Max wheelchair distance: 150'  Wheel 50 feet with 2 turns activity   Assist Level: Dependent - Patient 0%  Wheel 150 feet activity   Assist Level: Dependent - Patient 0%    Refer to Care Plan for Long Term Goals  SHORT TERM GOAL WEEK 1 PT Short Term Goal 1 (Week 1): Pt will perform supine<>sit consistently with modA. PT Short Term Goal 2 (Week 1): Pt will perform bed to chair transfer consistently with modA. PT Short Term Goal 3 (Week 1): Pt will perform sit to stand consistently with modA. PT Short Term Goal 4 (Week 1): Pt will initiate gait training.  Recommendations for other services: None   Skilled Therapeutic Intervention  Evaluation completed (see details above and below) with education on PT POC and goals and individual treatment initiated with focus on bed mobility, balance, and functional transfers. Pt received supine in bed and agrees to therapy. Reports 7/10 pain in L hip. RN present and provides pt with pain meds just prior to mobility. Pt performs bilateral rolling with PT to assist RN with dressing change, with maxA and cues for body mechanics. MaxA for supine to sit with cues on logrolling, sequencing, and positioning. Pt performs sit to stand from edge of bed with  totalA due to strong retropulsion and lack of hip extension. Pt unable to maintain standing without totalA. Pt sits back down and then performs squat pivot transfer to [facility] with maxA and cues for head hips relationship, anterior trunk lean, and initiation. Pt transported to gym via [place] dependently. Pt performs squat pivot to mat table with maxA. Pt able to maintain short sitting on mat with CGA/minA due to posterior drift and occasional L sided drift. Pt attempts sit to stand with RW but is unable to attain upright standing due to lack of glute engagement. Pt performs several reps of sit to stand with PT blocking L knee and standing in front of pt to ease fear of falling and promote anterior weight shifting, still requiring totalA to complete. Squat pivot from mat>WC>bed with maxA. Sit to supine with maxA and cues for sequencing. Left supine with alarm intact and all needs within reach.   Mobility Bed Mobility Bed Mobility: Supine to Sit;Sit to Supine Supine to Sit: Maximal Assistance - Patient - Patient 25-49% Sit to Supine: Maximal Assistance - Patient 25-49% Transfers Transfers: Sit to Stand;Stand to Sit;Squat Pivot Transfers Sit to Stand: Total Assistance - Patient < 25% Stand to Sit: Total Assistance - Patient < 25% Squat Pivot Transfers: Maximal Assistance - Patient 25-49% Transfer (Assistive device): 1 person hand held assist Locomotion  Gait Ambulation: No Gait Gait: No Wheelchair Mobility Wheelchair Mobility: Yes Wheelchair Assistance: Dependent - Patient 0% Distance: 150'   Discharge Criteria: Patient will be discharged from PT if patient refuses treatment 3 consecutive times without medical reason, if treatment goals not met, if there is a change in medical status, if patient makes no progress towards goals or if patient is discharged from hospital.  The above assessment, treatment plan, treatment alternatives and goals were discussed and mutually agreed upon: by patient  Breck Coons, PT, DPT 10/21/2021, 3:10 PM

## 2021-10-21 NOTE — Consult Note (Addendum)
WOC Nurse Consult Note: Patient receiving care in [phone] Reason for Consult: BLE skin tears Wound type: partial thickness wounds on the BLE from skin tears. Patient complaints of heels getting sore. Bilateral heels are red but still blanchable. Slight foot drop on the left foot. Recommend Prevalon boots Pressure Injury POA: NA Wound bed: Beefy red. Bleeding Drainage (amount, consistency, odor) Blood Periwound: Bruising Dressing procedure/placement/frequency: Clean the BLE gently with soap and water, rinse and pat dry. Apply a piece of Xeroform gauze over the LLE and RLE wounds, cover with a 4 x 4 and secure with a few turns of Kerlix. Avoid tape to the area if possible.  Place both feet in [id number] [employer] [place] # [phone]) Use pressure redistribution chair pad Kellie Simmering # [phone]) if up in chair.  Monitor the wound area(s) for worsening of condition such as: Signs/symptoms of infection, increase in size, development of or worsening of odor, development of pain, or increased pain at the affected locations.   Notify the medical team if any of these develop.  Thank you for the consult. [place] nurse will not follow at this time.   Please re-consult the [place] team if needed.  Cathlean Marseilles Tamala Julian, MSN, RN, [place], [place], [facility] Wound Treatment Associate Pager [phone]

## 2021-10-21 NOTE — Progress Notes (Signed)
Inpatient Rehabilitation Admission Medication Review by a Pharmacist  A complete drug regimen review was completed for this patient to identify any potential clinically significant medication issues.  High Risk Drug Classes Is patient taking? Indication by Medication  Antipsychotic Yes Compazine for N/V  Anticoagulant No   Antibiotic No   Opioid Yes Percocet for pain  Antiplatelet No   Hypoglycemics/insulin No   Vasoactive Medication Yes Furosemide, Metoprolol for CHF, BP  Chemotherapy No   Other Yes Vimpat, Keppra for seizures Protonix for GERD Synthroid for low thyroid     Type of Medication Issue Identified Description of Issue Recommendation(s)  Drug Interaction(s) (clinically significant)     Duplicate Therapy     Allergy     No Medication Administration End Date     Incorrect Dose     Additional Drug Therapy Needed     Significant med changes from prior encounter (inform family/care partners about these prior to discharge).    Other       Clinically significant medication issues were identified that warrant physician communication and completion of prescribed/recommended actions by midnight of the next day:  No   Pharmacist comments: None  Time spent performing this drug regimen review (minutes):  20 minutes   Tad Moore 10/21/2021 8:10 AM

## 2021-10-21 NOTE — Progress Notes (Signed)
Inpatient Rehabilitation  Patient information reviewed and entered into eRehab system by Rosalin Buster M. Tynasia Mccaul, M.A., CCC/SLP, PPS Coordinator.  Information including medical coding, functional ability and quality indicators will be reviewed and updated through discharge.    

## 2021-10-21 NOTE — Progress Notes (Signed)
Patient ID: [name] Riley, female   DOB: 15-Jan-1951, 71 y.o.   MRN: [id number]  Met with patient in room, introduce myself, and explained my role in her care. She was pleasant and resting comfortably in bed. No complaints of pain or discomfort. I explained that I have given her additional educational handouts in the [place] and that it is hers to take with her at discharge. I included Seizure Adult, Hypertension, Carotid Artery Disease, High Cholesterol, Hypothyroidism, and Understanding your risk for falls. I also stated that I would continue to follow and monitor her progress.  Dorthula Nettles, RN3, BSN, CBIS, [place], [facility], Inpatient Rehabilitation Office [phone] Cell [phone]

## 2021-10-22 DIAGNOSIS — C50919 Malignant neoplasm of unspecified site of unspecified female breast: Secondary | ICD-10-CM | POA: Diagnosis not present

## 2021-10-22 DIAGNOSIS — R7989 Other specified abnormal findings of blood chemistry: Secondary | ICD-10-CM | POA: Diagnosis not present

## 2021-10-22 DIAGNOSIS — G40909 Epilepsy, unspecified, not intractable, without status epilepticus: Secondary | ICD-10-CM | POA: Diagnosis not present

## 2021-10-22 DIAGNOSIS — G8194 Hemiplegia, unspecified affecting left nondominant side: Secondary | ICD-10-CM | POA: Diagnosis not present

## 2021-10-22 MED ORDER — CHLORHEXIDINE GLUCONATE CLOTH 2 % EX PADS
6.0000 | MEDICATED_PAD | Freq: Every day | CUTANEOUS | Status: DC
  Administered 2021-10-22 – 2021-10-28 (×9): 6 via TOPICAL

## 2021-10-22 NOTE — Progress Notes (Signed)
Physical Therapy Session Note  Patient Details  Name: [name] Riley MRN: [id number] Date of Birth: [birth year]  Today's Date: 10/22/2021 PT Individual Time: 0900-0944 PT Individual Time Calculation (min): 44 min   Short Term Goals: Week 1:  PT Short Term Goal 1 (Week 1): Pt will perform supine<>sit consistently with modA. PT Short Term Goal 2 (Week 1): Pt will perform bed to chair transfer consistently with modA. PT Short Term Goal 3 (Week 1): Pt will perform sit to stand consistently with modA. PT Short Term Goal 4 (Week 1): Pt will initiate gait training.  Skilled Therapeutic Interventions/Progress Updates:     Pt received supine in bed and agrees to therapy, though reports significant fatigue. Also reports pain in L leg. Number not provided. PT provides rest breaks as needed and mobility to manage pain. Pt performs bilateral rolling to help don pants, requiring maxA/totalA to complete. Pt then performs supine to sit with totalA and cues for sequencing and positioning. Squat pivot to [facility] with maxA and cues for anterior weight shifting and hip extension. WC transport to gym for time management. Squat pivot to Nustep with maxA and same cueing. Pt completes Nustep for strength and endurance training, as well as reciprocal coordination. Pt able to complete 6 minutes on workload of 4 with average steps per minute ~35. PT provides cues for completing full ROM as well maintaining midline positioning as pt has L lateral trunk lean. Pt reports increasing stomach ache and thinks that she needs to have bowel movement. Squat pivot from Nustep>WC>bed with maxA. TotalA for sit to supine. Left with alarm intact and all needs within reach.  Therapy Documentation Precautions:  Precautions Precautions: Fall Precaution Comments: L side weakness Restrictions Weight Bearing Restrictions: No ts:      Therapy/Group: Individual Therapy  Breck Coons, PT, DPT 10/22/2021, 12:37 PM

## 2021-10-22 NOTE — Care Management (Signed)
Inpatient [facility] Individual Statement of Services  Patient Name:  [name] Riley  Date:  10/22/2021  Welcome to the [facility].  Our goal is to provide you with an individualized program based on your diagnosis and situation, designed to meet your specific needs.  With this comprehensive rehabilitation program, you will be expected to participate in at least 3 hours of rehabilitation therapies Monday-Friday, with modified therapy programming on the weekends.  Your rehabilitation program will include the following services:  Physical Therapy (PT), Occupational Therapy (OT), Speech Therapy (ST), 24 hour per day rehabilitation nursing, Therapeutic Recreaction (TR), Psychology, Neuropsychology, Care Coordinator, Rehabilitation Medicine, [place], and Other  Weekly team conferences will be held on Tuesdays to discuss your progress.  Your Inpatient Rehabilitation Care Coordinator will talk with you frequently to get your input and to update you on team discussions.  Team conferences with you and your family in attendance may also be held.  Expected length of stay: 2-3 weeks    Overall anticipated outcome: Minimal Assistance  Depending on your progress and recovery, your program may change. Your Inpatient Rehabilitation Care Coordinator will coordinate services and will keep you informed of any changes. Your Inpatient Rehabilitation Care Coordinator's name and contact numbers are listed  below.  The following services may also be recommended but are not provided by the [facility] will be made to provide these services after discharge if needed.  Arrangements include referral to agencies that provide these services.  Your insurance has been verified to be:  Medicare A/B  Your  primary doctor is:  [name]  Pertinent information will be shared with your doctor and your insurance company.  Inpatient Rehabilitation Care Coordinator:  Cathleen Corti [phone] or [phone]  Information discussed with and copy given to patient by: Rana Snare, 10/22/2021, 9:35 AM

## 2021-10-22 NOTE — Progress Notes (Signed)
Speech Language Pathology Daily Session Note  Patient Details  Name: [name] Riley MRN: [id number] Date of Birth: Feb 23, 1951  Today's Date: 10/22/2021 SLP Individual Time: 0710-0810 SLP Individual Time Calculation (min): 60 min  Short Term Goals: Week 1: SLP Short Term Goal 1 (Week 1): Patient will utilize external aids to recall new, daily information with Mod A multimodal cues. SLP Short Term Goal 2 (Week 1): Patient will demonstrate functional problem solving for basic and familiar tasks with Mod A verbal cues. SLP Short Term Goal 3 (Week 1): Patient will attend to left field of enviornment during functional tasks with Mod A multimodal cues. SLP Short Term Goal 4 (Week 1): Patient will demonstrate sustianed attention to functional tasks for 30 minutes with Min verbal cues for redirection. SLP Short Term Goal 5 (Week 1): Patient will self-monitor and correct errors during functional tasks with Mod A verbal cues.  Skilled Therapeutic Interventions: Skilled treatment session focused on cognitive goals. Upon arrival, patient was awake in bed and consuming her breakfast meal. SLP facilitated session by providing overall Mod A verbal cues for sustained attention to task requiring frequent redirection. Patient able to locate items within her left visual field with supervision level verbal cues and often attempted to utilize her LUE for functional tasks while self-feeding despite minimal function at this time. Patient reporting decreased recall of daily information, therefore, SLP initiated use of a memory notebook. SLP also provided Min verbal cues for patient to utilize external aids for orientation to date. Patient left upright in bed with alarm on and all needs within reach. Continue with current plan of care.      Pain No/Denies Pain   Therapy/Group: Individual Therapy  Geovannie Vilar 10/22/2021, 4:01 PM

## 2021-10-22 NOTE — Progress Notes (Addendum)
PROGRESS NOTE   Subjective/Complaints: Pt doing ok. Said therapies went "slow" yesterday meaning she was tired afterwards. Left hip still sore  ROS: Patient denies fever, rash, sore throat, blurred vision, nausea, vomiting, diarrhea, cough, shortness of breath or chest pain,   headache, or mood change.    Objective:   CT HIP LEFT WO CONTRAST  Result Date: 10/20/2021 CLINICAL DATA:  Possible left acetabular fracture. History of breast cancer. EXAM: CT OF THE LEFT HIP WITHOUT CONTRAST TECHNIQUE: Multidetector CT imaging of the left hip was performed according to the standard protocol. Multiplanar CT image reconstructions were also generated. COMPARISON:  Left hip x-rays from same day. CT chest, abdomen, and pelvis dated September 15, 2021. FINDINGS: Bones/Joint/Cartilage No acute fracture or dislocation. 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 Widespread sclerotic osseous metastases, not significantly changed. Unchanged mild to moderate left hip joint space narrowing with marginal osteophytes. No joint effusion. Ligaments Ligaments are suboptimally evaluated by CT. Muscles and Tendons Grossly intact.  Gluteus minimus muscle atrophy. Soft tissue No fluid collection or hematoma.  No soft tissue mass. IMPRESSION: 1. No acute osseous abnormality. 2. 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 3. Widespread sclerotic osseous metastases, not significantly changed. Electronically Signed   By: Titus Dubin M.D.   On: 10/20/2021 14:32   DG HIP UNILAT WITH PELVIS 2-3 VIEWS LEFT  Result Date: 10/20/2021 CLINICAL DATA:  Bilateral hip pain right worse than the left. History of breast cancer. EXAM: DG HIP (WITH OR WITHOUT PELVIS) 2-3V LEFT COMPARISON:  None. FINDINGS: There are  multiple sclerotic densities overlying the pelvis and left hip highly suspicious for metastatic disease. There is a linear lucency projecting over the acetabulum and femoral head, findings are suspicious for acetabular fracture. IMPRESSION: 1. Multiple sclerotic densities overlying the pelvis and left hip concerning for metastatic disease. 2. Linear lucency projecting over the acetabulum and femoral head, suspicious for mildly displaced acetabular fracture. Cross-sectional imaging would be helpful for further evaluation. Electronically Signed   By: Keane Police D.O.   On: 10/20/2021 12:12   DG HIP UNILAT WITH PELVIS 2-3 VIEWS RIGHT  Result Date: 10/20/2021 CLINICAL DATA:  Bilateral hip pain. History of metastatic breast cancer. EXAM: DG HIP (WITH OR WITHOUT PELVIS) 2-3V RIGHT COMPARISON:  None. FINDINGS: There is no evidence of hip fracture or dislocation. Sclerotic densities are seen throughout the pelvis and visualized proximal femur and sacrum consistent with osseous metastatic disease. Moderate degenerative joint disease is seen involving both hips. IMPRESSION: Diffuse osseous metastatic disease as described above. 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 No fractures noted. Electronically Signed   By: Marijo Conception M.D.   On: 10/20/2021 11:47   Recent Labs    10/21/21 0606  WBC 6.6  HGB 11.7*  HCT 34.5*  PLT 57*   Recent Labs    10/21/21 0606  NA 136  K 3.9  CL 103  CO2 27  GLUCOSE 82  BUN 20  CREATININE 0.60  CALCIUM 8.2*    Intake/Output Summary (Last 24 hours) at 10/22/2021 0910  Last data filed at 10/22/2021 4696 Gross per 24 hour  Intake 834 ml  Output 2100 ml  Net -1266 ml        Physical Exam: Vital Signs Blood pressure (!) 149/66, pulse 70, temperature 98 F (36.7 C), temperature source Oral, resp. rate 14, height 5\' 8"  (1.727 m), weight 63.2 kg, SpO2 98 %.  Constitutional: No distress . Vital signs reviewed. HEENT: NCAT, EOMI, oral membranes  moist Neck: supple Cardiovascular: RRR without murmur. No JVD    Respiratory/Chest: CTA Bilaterally without wheezes or rales. Normal effort    GI/Abdomen: BS +, non-tender, non-distended Ext: no clubbing, cyanosis, or edema Psych: pleasant and cooperative  Skin: ecchymoses and skin tears/wounds bilateral shins, stasis changes are stable to improved.  A few other scattered bruises Neuro:  Pt is alert, oriented to hospital, why she's here, month. Follows commands. Provides some biographical information. No focal CN findings. RUE 5/5. LUE 2-3/5 prox to distal. RLE 4-5/5. LLE 4/5. Mild left sided sensory deficits and inattention. Early flexor tone LUE--stable appearance Musculoskeletal: pain in left hip with PROM again today.     Assessment/Plan: 1. Functional deficits which require 3+ hours per day of interdisciplinary therapy in a comprehensive inpatient rehab setting. Physiatrist is providing close team supervision and 24 hour management of active medical problems listed below. Physiatrist and rehab team continue to assess barriers to discharge/monitor patient progress toward functional and medical goals  Care Tool:  Bathing  Bathing activity did not occur: Safety/medical concerns Body parts bathed by patient: Left arm, Chest, Abdomen, Front perineal area, Right upper leg, Face   Body parts bathed by helper: Right arm, Buttocks, Left lower leg, Right lower leg, Left upper leg     Bathing assist Assist Level: Total Assistance - Patient < 25%     Upper Body Dressing/Undressing Upper body dressing   What is the patient wearing?: Pull over shirt    Upper body assist Assist Level: Total Assistance - Patient < 25%    Lower Body Dressing/Undressing Lower body dressing      What is the patient wearing?: Incontinence brief, Pants     Lower body assist Assist for lower body dressing: 2 Helpers     Toileting Toileting Toileting Activity did not occur (Clothing management and  hygiene only): N/A (no void or bm)  Toileting assist Assist for toileting: Total Assistance - Patient < 25% (Simultaneous filing. User may not have seen previous data.)     Transfers Chair/bed transfer  Transfers assist     Chair/bed transfer assist level: Total Assistance - Patient < 25%     Locomotion Ambulation   Ambulation assist   Ambulation activity did not occur: Safety/medical concerns          Walk 10 feet activity   Assist  Walk 10 feet activity did not occur: Safety/medical concerns        Walk 50 feet activity   Assist Walk 50 feet with 2 turns activity did not occur: Safety/medical concerns         Walk 150 feet activity   Assist Walk 150 feet activity did not occur: Safety/medical concerns         Walk 10 feet on uneven surface  activity   Assist Walk 10 feet on uneven surfaces activity did not occur: Safety/medical concerns         Wheelchair     Assist Is the patient using a wheelchair?: Yes Type of Wheelchair: Manual    Wheelchair assist level:  Dependent - Patient 0% Max wheelchair distance: 150'    Wheelchair 50 feet with 2 turns activity    Assist        Assist Level: Dependent - Patient 0%   Wheelchair 150 feet activity     Assist      Assist Level: Dependent - Patient 0%   Blood pressure (!) 149/66, pulse 70, temperature 98 F (36.7 C), temperature source Oral, resp. rate 14, height 5\' 8"  (1.727 m), weight 63.2 kg, SpO2 98 %.  Medical Problem List and Plan: 1. Functional deficits secondary to metastatic breast cancer             -patient may shower             -ELOS/Goals: 14-21 days            -Continue CIR therapies including PT, OT, and SLP  2.  Antithrombotics: -DVT/anticoagulation:  Mechanical: Sequential compression devices, below knee Bilateral lower extremities             -antiplatelet therapy: N/A 3. Pain Management: Oxycodone prn.  4. Mood: LCSW to follow for evaluation and  support.              -antipsychotic agents: N/a 5. Neuropsych: This patient is capable of making decisions on her own behalf. 6. Skin/Wound Care: routine pressure relief measures.              --continue air mattress overlay to prevent breakdown. Prevalon boots for sore heels.              -continue xeroform, 4x4, kerlix to areas for protection. Appreciate WOC RN note      1/6 above areas clean, healing 7. Fluids/Electrolytes/Nutrition: Monitor I/O. Check lytes in am.  8. Focal status epilepticus:  Continue Keppra 2000 mg BID and Vimpat 50 mg BID.   -remains seizure free thus far on rehab             --Vimpat 50 mg IV every 12 hours prn breakthrough and Ativan 2 mg IV prn seizure lasting more than 5 minutes.  -Contact neurology as needed for breakthrough seizure.  9. Hepatic cirrhosis: Ammonia levels remain elevated 47-->58. Continue lactulose 10 mg bid.              -1/6 both LFT's and ammonia level with further increase today              -increased lactulose to 20mg  bid   -pt clinically looks ok today   -recheck labs Monday 10. Breast cancer with extensive bony mets:discussed benefits of prehab for response to chemotherapy.   -multiple mets on imaging of pelvis 11. Cerebral mets: Continue decadron 4 mg daily.  12. Thrombocytopenia: Plts 92-->59-->51.    --1/5 plts up to 57k --> recheck monday 13. HTN: monitor BP TID--continue lasix and metoprolol.               --metoprolol decreased 01/04 due to hypotension.  14. HLD: LDL 129 on 04/24/19: statin not recommended given elevated FLTs.  15. Urine retention:  1/6-u/a equivocal, check ucx   -oob to empty   -begin urecholine trial   At least 35 total minutes were spent in examination of patient, assessment of pertinent data,  formulation of a treatment plan, and in discussion with patient and/or family.    LOS: 2 days A FACE TO FACE EVALUATION WAS PERFORMED  Meredith Staggers 10/22/2021, 9:10 AM

## 2021-10-22 NOTE — IPOC Note (Signed)
Overall Plan of Care [facility]) Patient Details Name: [name] Riley MRN: [id number] DOB: 1951/06/15  Admitting Diagnosis: Breast cancer metastasized to brain [facility])  Hospital Problems: Principal Problem:   Breast cancer metastasized to brain [facility])     Functional Problem List: Nursing Bladder, Edema, Endurance, Medication Management, Pain, Safety  PT Balance, Motor, Pain, Perception, Safety, Sensory, Skin Integrity, Endurance  OT Balance, Cognition, Endurance, Edema, Motor, Pain, Perception, Safety, Sensory, Skin Integrity, Vision  SLP Cognition  TR         Basic ADLs: OT Eating, Grooming, Dressing, Bathing, Toileting     Advanced  ADLs: OT       Transfers: PT Bed Mobility, Bed to Chair, Car, Manufacturing systems engineer, Metallurgist: PT Ambulation, Emergency planning/management officer, Stairs     Additional Impairments: OT Fuctional Use of Upper Extremity  SLP Social Cognition   Problem Solving, Memory, Awareness, Attention  TR      Anticipated Outcomes Item Anticipated Outcome  Self Feeding Set-up  Swallowing      Basic self-care  Min A  Toileting  Min A   Bathroom Transfers Min A  Bowel/Bladder  min assist  Transfers  MinA  Locomotion  TBD, pending progress  Communication     Cognition  Min A  Pain  < 3  Safety/Judgment  min assist and no falls   Therapy Plan: PT Intensity: Minimum of 1-2 x/day ,45 to 90 minutes PT Frequency: 5 out of 7 days PT Duration Estimated Length of Stay: 2-3 Weeks OT Intensity: Minimum of 1-2 x/day, 45 to 90 minutes OT Frequency: 5 out of 7 days OT Duration/Estimated Length of Stay: 2.5-3 weeks SLP Intensity: Minumum of 1-2 x/day, 30 to 90 minutes SLP Frequency: 3 to 5 out of 7 days SLP Duration/Estimated Length of Stay: 2-3 weeks   Due to the current state of emergency, patients may not be receiving their 3-hours of Medicare-mandated therapy.   Team Interventions: Nursing Interventions Patient/Family Education, Bladder  Management, Disease Management/Prevention, Pain Management, Medication Management, Discharge Planning, Psychosocial Support  PT interventions Ambulation/gait training, Community reintegration, Neuromuscular re-education, Psychosocial support, Stair training, UE/LE Strength taining/ROM, Wheelchair propulsion/positioning, Training and development officer, Pain management, Functional electrical stimulation, Discharge planning, Skin care/wound management, Therapeutic Activities, UE/LE Coordination activities, Cognitive remediation/compensation, Disease management/prevention, Functional mobility training, Patient/family education, Splinting/orthotics, Therapeutic Exercise, Visual/perceptual remediation/compensation, DME/adaptive equipment instruction  OT Interventions Training and development officer, Cognitive remediation/compensation, Community reintegration, Discharge planning, Disease mangement/prevention, DME/adaptive equipment instruction, Functional electrical stimulation, Functional mobility training, Neuromuscular re-education, Pain management, Self Care/advanced ADL retraining, Patient/family education, Psychosocial support, Skin care/wound managment, Splinting/orthotics, Therapeutic Activities, Therapeutic Exercise, UE/LE Strength taining/ROM, UE/LE Coordination activities, Visual/perceptual remediation/compensation, Wheelchair propulsion/positioning  SLP Interventions Cognitive remediation/compensation, Internal/external aids, Therapeutic Activities, Environmental controls, Cueing hierarchy, Functional tasks, Patient/family education  TR Interventions    SW/CM Interventions Discharge Planning, Psychosocial Support, Patient/Family Education   Barriers to Discharge MD  Medical stability  Nursing Decreased caregiver support, Home environment access/layout, Incontinence, Lack of/limited family support, Medication compliance, Pending chemo/radiation Lives in 2 level home with 4 steps to enter and 2 rails. Able to  live on main level with bedroom/bathroom. Lives with spouse who can provide 24/7 assist at discharge.  PT Home environment access/layout, Pending chemo/radiation    OT      SLP      SW       Team Discharge Planning: Destination: PT-Home ,OT- Home , SLP-Home Projected Follow-up: PT-Home health PT, 24 hour supervision/assistance, OT-  Home health OT, 24 hour  supervision/assistance (vs. no OT follow up pending goals of care), SLP-Home Health SLP, 24 hour supervision/assistance Projected Equipment Needs: PT-To be determined, OT- To be determined, SLP-None recommended by SLP Equipment Details: PT- , OT-  Patient/family involved in discharge planning: PT- Patient,  OT-Patient, SLP-Patient  MD ELOS: 17-20 days Medical Rehab Prognosis:  Excellent Assessment: The patient has been admitted for CIR therapies with the diagnosis of metastatic breast cancer, encephalopathy. The team will be addressing functional mobility, strength, stamina, balance, safety, adaptive techniques and equipment, self-care, bowel and bladder mgt, patient and caregiver education, NMR, pain mgt, cognition, communication, wound care. Goals have been set at  min assist for mobility, sefl-care and cognition.   Due to the current state of emergency, patients may not be receiving their 3 hours per day of Medicare-mandated therapy.    Meredith Staggers, MD, FAAPMR     See Team Conference Notes for weekly updates to the plan of care

## 2021-10-22 NOTE — Progress Notes (Signed)
Occupational Therapy Session Note  Patient Details  Name: [name] Riley MRN: [id number] Date of Birth: [birth year]  Today's Date: 10/22/2021 OT Individual Time: 1445-1500 OT Individual Time Calculation (min): 15 min  and Today's Date: 10/22/2021 OT Missed Time: 63 Minutes Missed Time Reason: Patient fatigue   Short Term Goals: Week 1:  OT Short Term Goal 1 (Week 1): Patient will tolerate sitting EOB for 10 minutes in preparation for BADL taks OT Short Term Goal 2 (Week 1): Pt will use L UE as a stabilizer with moderate assistance within BADL task OT Short Term Goal 3 (Week 1): Patient will complete toilet transfer with max A of 2 and LRAD  Skilled Therapeutic Interventions/Progress Updates:    OT attempted to see patient at scheduled therapy session time at 11:00 this morning. Patient asleep, and although would wake for brief period, declined to participate due to fatigue. OT returned later in the afternoon and pt was still asleep. OT was able to wake patient with increased time to achieve alertness. OT assisted pt with gentle ROM of L UE with Moderate cues to look left and locate L UE as OT provided PROM. Pt able to activate shoulder and elbow slightly, but continued to be fatigued making it difficult for pt to attend to L side. OT provided pt with ice water and discussed plan to spread out therapies more with rest breaks so she can hopefully participate better. Pt left semi-reclined in bed with bed alarm on and needs met.  Therapy Documentation Precautions:  Precautions Precautions: Fall Precaution Comments: L side weakness Restrictions Weight Bearing Restrictions: No General: General OT Amount of Missed Time: 45 Minutes Pain: Pain Assessment Pain Scale: 0-10 Pain Score: 5  Pain Type: Acute pain Pain Location: Buttocks Pain Descriptors / Indicators: Cramping Pain Frequency: Constant Patients Stated Pain Goal: 0 Pain Intervention(s): Repositioned   Therapy/Group: Individual  Therapy  Valma Cava 10/22/2021, 3:23 PM

## 2021-10-22 NOTE — Progress Notes (Signed)
Occupational Therapy Session Note ° °Patient Details  °Name: [name] Riley °MRN: [id number] °Date of Birth: 12/20/1950 ° ° °Short Term Goals: °Week 1:  OT Short Term Goal 1 (Week 1): Patient will tolerate sitting EOB for 10 minutes in preparation for BADL taks °OT Short Term Goal 2 (Week 1): Pt will use L UE as a stabilizer with moderate assistance within BADL task °OT Short Term Goal 3 (Week 1): Patient will complete toilet transfer with max A of 2 and LRAD ° °Skilled Therapeutic Interventions/Progress Updates:  °Patient met lying supine in bed. Reports fatigue from previous PT session and abdominal pain. Declined all bed level, EOB and OOB activity. Patient missed 30 minutes of skilled OT treatment session.  ° °Therapy Documentation °Precautions:  °Precautions °Precautions: Fall °Precaution Comments: L side weakness °Restrictions °Weight Bearing Restrictions: No °General: °General °OT Amount of Missed Time: 30 Minutes ° °Therapy/Group: Individual Therapy ° °Destanae R Howerton-Davis °10/22/2021, 10:00 AM °

## 2021-10-23 NOTE — Progress Notes (Signed)
Physical Therapy Session Note  Patient Details  Name: [name] Riley MRN: [id number] Date of Birth: [birth year]  Today's Date: 10/23/2021 PT Individual Time: 0905-1006 and 7829-5621 PT Individual Time Calculation (min): 61 min and 62 min  Short Term Goals: Week 1:  PT Short Term Goal 1 (Week 1): Pt will perform supine<>sit consistently with modA. PT Short Term Goal 2 (Week 1): Pt will perform bed to chair transfer consistently with modA. PT Short Term Goal 3 (Week 1): Pt will perform sit to stand consistently with modA. PT Short Term Goal 4 (Week 1): Pt will initiate gait training.  Skilled Therapeutic Interventions/Progress Updates:    Session 1: Pt received supine in bed, HOB elevated, with pt noted to have trunk lean all the way towards L bedrail. Pt agreeable to therapy session. Rolling L with hand-over-hand assistance to motor plan bringing R UE across her body to grab bedrail and max multimodal verbal cuing to place R LE into hooklying (pt with good active movement in R LE but reports no sensation) and roll with max assist . Rolling R with max multimodal cuing for placing L LE into hooklying position with max assist and then rolling with total assist for L UE management. R sidelying>sitting EOB with total assist for B LE management off EOB and bringing trunk upright. Pt able to sit on EOB with CGA to light mod assist due to L lean. R squat pivot EOB>w/c requiring multiple scoots with total assist for lifting and rotating hips - multimodal cuing to initiate and maintain anterior trunk lean during transfer. While sitting in w/c pt continues to demo L lateral trunk lean requiring therapist to place a pillow to improve midline posture.  Transported to/from gym in w/c for time management and energy conservation.   Attempted sit>stand from w/c>stedy 2x with total assist of 1 for lifting to stand and only able to slightly clear hips from w/c but not able to come up more into standing than that -  continues to have strong L trunk lean/possible pushing requiring repeated verbal cuing to improve, with pt only able to achieve a slight change.  Transported back to room. R squat pivot w/c>EOB with heavier total assist at this time for lifting/pivoting hips and maintaining trunk balance due to strong L lean/pushing and requiring 2x attempts to get hips over to bed.Sit>supine via reverse logroll technique to increase pt independence with total assist for trunk descent and B LE management onto bed. Therapeutically placed pillows for pressure relief of B heels and for LUE alignment. Pt left supine in bed with needs in reach and bed alarm on.  Throughout session pt demos delayed initiation, impaired motor planning, impaired awareness, pusher syndrome tendencies, and L hemibody and hemi environment inattention.     Session 2: Pt received supine in bed with her husband, Hassle, present and with encouragement pt agreeable to therapy session. Therapist retrieved a wedge to provide improved postural support in the bed as pt continues to demonstrate L lateral trunk lean. Therapist retrieved a TIS wheelchair with a [employer] air cushion for improved pressure relief while also providing positional support as well as a full lap tray for L UE support. Supine>sitting R EOB via logroll technique to increase pt participation and independence with max assist for placing L LE into hooklying and then total assist for rolling, bringing B LEs off EOB, and trunk upright. Sitting EOB with R UE support on bedrail and min assist for static sitting balance due to L lean.  L lateral scoot transfer EOB>TIS w/c using transfer board with mod assist for sitting balance from +2 and total assist for board placement - requires total assist for scooting hips, maintaining trunk balance due to L lean/pushing, and for B LE positioning - cuing to maintain anterior trunk lean/weight shift. Educated pt's husband on bringing her in shoes to protect her  feet and provide increased grip on the floor.  Transported to/from gym in w/c for time management and energy conservation. Therapist adjusted headrest for improved midline orientation and B LE leg rests to correct length. At end of session pt agreeable to remain sitting up in TIS w/c. Therapist donned full lap tray and provided towel cushioning for LUE support and pt left seated upright in TIS w/c with her husband present, meal tray set-up, needs in reach, and nurse aware of pt's position.   Pt's husband reports pt's PLOF was primarily stand pivot transfers to/from wheelchair requiring assistance and only ambulating with home health physical therapist 2x/week with him providing wheelchair follow.  Therapy Documentation Precautions:  Precautions Precautions: Fall Precaution Comments: L side weakness Restrictions Weight Bearing Restrictions: No   Pain: Session 1:  Reports pain around back of her neck and towards end of session states she is having pain  "all over" especially in L hip - nurse notified and present for medication administration - therapist providing repositioning, emotional support, and distraction for pain management.  Session 2: Reports R hip pain - premedicated - therapist assisted pt to reposition into wheelchair for change of positioning for pain management.   Therapy/Group: Individual Therapy  Tawana Scale , PT, DPT, NCS, CSRS  10/23/2021, 7:45 AM

## 2021-10-23 NOTE — Progress Notes (Signed)
PROGRESS NOTE   Subjective/Complaints:  Pt reports bottom hurts from sitting so long in meds.  Took pain meds already- haven't kicked in yet.  Constipated- doesn't feel when she goes. LBM yesterday? But only going 2-3x/week.     ROS:  Pt denies SOB, abd pain, CP, N/V/(+) C/D, and vision changes   Objective:   No results found. Recent Labs    10/21/21 0606  WBC 6.6  HGB 11.7*  HCT 34.5*  PLT 57*   Recent Labs    10/21/21 0606  NA 136  K 3.9  CL 103  CO2 27  GLUCOSE 82  BUN 20  CREATININE 0.60  CALCIUM 8.2*    Intake/Output Summary (Last 24 hours) at 10/23/2021 1350 Last data filed at 10/23/2021 1302 Gross per 24 hour  Intake 360 ml  Output 2000 ml  Net -1640 ml        Physical Exam: Vital Signs Blood pressure (!) 162/67, pulse 95, temperature 98.9 F (37.2 C), temperature source Oral, resp. rate 17, height 5\' 8"  (1.727 m), weight 63.2 kg, SpO2 97 %.    General: awake, alert, appropriate, sitting up in bed slightly; frail appearing; NAD HENT: conjugate gaze; oropharynx moist CV: regular rate; no JVD Pulmonary: CTA B/L; no W/R/R- good air movement GI: soft, NT, ND, (+)BS- hypoactive Psychiatric: appropriate- interactive Neurological: alert Ext: no clubbing, cyanosis, or edema Psych: pleasant and cooperative  Skin: ecchymoses and skin tears/wounds bilateral shins, stasis changes are stable to improved.  A few other scattered bruises Neuro:  Pt is alert, oriented to hospital, why she's here, month. Follows commands. Provides some biographical information. No focal CN findings. RUE 5/5. LUE 2-3/5 prox to distal. RLE 4-5/5. LLE 4/5. Mild left sided sensory deficits and inattention. Early flexor tone LUE--stable appearance Musculoskeletal: pain in left hip with PROM again today.     Assessment/Plan: 1. Functional deficits which require 3+ hours per day of interdisciplinary therapy in a comprehensive  inpatient rehab setting. Physiatrist is providing close team supervision and 24 hour management of active medical problems listed below. Physiatrist and rehab team continue to assess barriers to discharge/monitor patient progress toward functional and medical goals  Care Tool:  Bathing  Bathing activity did not occur: Safety/medical concerns Body parts bathed by patient: Left arm, Chest, Abdomen, Front perineal area, Right upper leg, Face   Body parts bathed by helper: Right arm, Buttocks, Left lower leg, Right lower leg, Left upper leg     Bathing assist Assist Level: Total Assistance - Patient < 25%     Upper Body Dressing/Undressing Upper body dressing   What is the patient wearing?: Pull over shirt    Upper body assist Assist Level: Total Assistance - Patient < 25%    Lower Body Dressing/Undressing Lower body dressing      What is the patient wearing?: Incontinence brief, Pants     Lower body assist Assist for lower body dressing: 2 Helpers     Toileting Toileting Toileting Activity did not occur (Clothing management and hygiene only): N/A (no void or bm)  Toileting assist Assist for toileting: Dependent - Patient 0% (requires in and out cath)     Transfers  Chair/bed transfer  Transfers assist     Chair/bed transfer assist level: Total Assistance - Patient < 25%     Locomotion Ambulation   Ambulation assist   Ambulation activity did not occur: Safety/medical concerns          Walk 10 feet activity   Assist  Walk 10 feet activity did not occur: Safety/medical concerns        Walk 50 feet activity   Assist Walk 50 feet with 2 turns activity did not occur: Safety/medical concerns         Walk 150 feet activity   Assist Walk 150 feet activity did not occur: Safety/medical concerns         Walk 10 feet on uneven surface  activity   Assist Walk 10 feet on uneven surfaces activity did not occur: Safety/medical concerns          Wheelchair     Assist Is the patient using a wheelchair?: Yes Type of Wheelchair: Manual    Wheelchair assist level: Dependent - Patient 0% Max wheelchair distance: 150'    Wheelchair 50 feet with 2 turns activity    Assist        Assist Level: Dependent - Patient 0%   Wheelchair 150 feet activity     Assist      Assist Level: Dependent - Patient 0%   Blood pressure (!) 162/67, pulse 95, temperature 98.9 F (37.2 C), temperature source Oral, resp. rate 17, height 5\' 8"  (1.727 m), weight 63.2 kg, SpO2 97 %.  Medical Problem List and Plan: 1. Functional deficits secondary to metastatic breast cancer             -patient may shower             -ELOS/Goals: 14-21 days            -Continue CIR- PT, OT and SLP 2.  Antithrombotics: -DVT/anticoagulation:  Mechanical: Sequential compression devices, below knee Bilateral lower extremities             -antiplatelet therapy: N/A 3. Pain Management: Oxycodone prn.  4. Mood: LCSW to follow for evaluation and support.              -antipsychotic agents: N/a 5. Neuropsych: This patient is capable of making decisions on her own behalf. 6. Skin/Wound Care: routine pressure relief measures.              --continue air mattress overlay to prevent breakdown. Prevalon boots for sore heels.              -continue xeroform, 4x4, kerlix to areas for protection. Appreciate WOC RN note      1/6 above areas clean, healing  1/7- bottom hurting- took pain meds for pain- con't regimen 7. Fluids/Electrolytes/Nutrition: Monitor I/O. Check lytes in am.  8. Focal status epilepticus:  Continue Keppra 2000 mg BID and Vimpat 50 mg BID.   -remains seizure free thus far on rehab             --Vimpat 50 mg IV every 12 hours prn breakthrough and Ativan 2 mg IV prn seizure lasting more than 5 minutes.  -Contact neurology as needed for breakthrough seizure.  9. Hepatic cirrhosis: Ammonia levels remain elevated 47-->58. Continue lactulose 10 mg  bid.              -1/6 both LFT's and ammonia level with further increase today              -  increased lactulose to 20mg  bid   -pt clinically looks ok today   -recheck labs Monday  1/7- LBM actually this afternoon- small- con't regimen 10. Breast cancer with extensive bony mets:discussed benefits of prehab for response to chemotherapy.   -multiple mets on imaging of pelvis 11. Cerebral mets: Continue decadron 4 mg daily.  12. Thrombocytopenia: Plts 92-->59-->51.    --1/5 plts up to 57k --> recheck monday 13. HTN: monitor BP TID--continue lasix and metoprolol.               --metoprolol decreased 01/04 due to hypotension.   1/7- BP controlled usually, but somewhat elevated this AM- con't regimen and monitor for trend 14. HLD: LDL 129 on 04/24/19: statin not recommended given elevated FLTs.  15. Urine retention:  1/6-u/a equivocal, check ucx   -oob to empty   -begin urecholine trial      LOS: 3 days A FACE TO FACE EVALUATION WAS PERFORMED  Sharona Rovner 10/23/2021, 1:50 PM

## 2021-10-23 NOTE — Progress Notes (Signed)
Occupational Therapy Session Note  Patient Details  Name: [name] Riley MRN: [id number] Date of Birth: [birth year]  Today's Date: 10/23/2021 OT Individual Time: 1445-1600 OT Individual Time Calculation (min): 75 min    Short Term Goals: Week 1:  OT Short Term Goal 1 (Week 1): Patient will tolerate sitting EOB for 10 minutes in preparation for BADL taks OT Short Term Goal 2 (Week 1): Pt will use L UE as a stabilizer with moderate assistance within BADL task OT Short Term Goal 3 (Week 1): Patient will complete toilet transfer with max A of 2 and LRAD   Skilled Therapeutic Interventions/Progress Updates:  Pt semi reclined in bed, leaning significantly to left side.  Pt initially sleeping with husband at side, but able to wake and agreeable to participating in OT session with some encouragement.  Pt completed supine to left sidelying with max assist and max multimodal cues to facilitate left head turn and use of bed rail using RUE.  Sidelying to sit with total assist with pt very fearful of falling and hesitant to release right hand from bed rail.  Pt able to intermittently maintain static sitting balance with CGA when right hand grasping foot rail on right side but only briefly then when attention divided, pt strongly pushing to the left and also likely avoiding shifting weight onto right hip due to resulting pain.  Pt doffed shirt with max assist and max multimodal cues to attend to LUE (with fair to poor follow through).  Pt bathed UB with max assist (max assist to maintain sitting balance with therapist sitting on left side and pt pushing quite a bit despite cues and manual repositioning to center).  Pt returned to supine with total assist +2 including repositioning towards HOB using boost technique.  Pt rolled left and right with total assist +2 to complete LB dressing.  Pt was able to assist some during LB bathing to wash peri region, otherwise total assist.  Pt brushed teeth with supervision at bed  level with HOB elevated.  LUE supported with pillows into functional safe position, call bell in reach at end of session.   Therapy Documentation Precautions:  Precautions Precautions: Fall Precaution Comments: L side weakness Restrictions Weight Bearing Restrictions: No    Therapy/Group: Individual Therapy  Ezekiel Slocumb 10/23/2021, 5:06 PM

## 2021-10-24 LAB — URINALYSIS, MICROSCOPIC (REFLEX): WBC, UA: >50 WBC/hpf (ref 0–5)

## 2021-10-24 LAB — URINALYSIS, ROUTINE W REFLEX MICROSCOPIC
Bilirubin Urine: NEGATIVE
Glucose, UA: NEGATIVE mg/dL
Ketones, ur: NEGATIVE mg/dL
Nitrite: NEGATIVE
Protein, ur: NEGATIVE mg/dL
Specific Gravity, Urine: 1.02 (ref 1.005–1.030)
pH: 5.5 (ref 5.0–8.0)

## 2021-10-24 MED ORDER — POLYETHYLENE GLYCOL 3350 17 G PO PACK
17.0000 g | PACK | Freq: Every day | ORAL | Status: DC
  Administered 2021-10-25: 17 g via ORAL
  Filled 2021-10-24: qty 1

## 2021-10-24 NOTE — Progress Notes (Signed)
Physical Therapy Session Note  Patient Details  Name: [name] Riley MRN: [id number] Date of Birth: 1951-02-19  Today's Date: 10/24/2021 PT Individual Time: 1505-1605 PT Individual Time Calculation (min): 60 min   Short Term Goals: Week 1:  PT Short Term Goal 1 (Week 1): Pt will perform supine<>sit consistently with modA. PT Short Term Goal 2 (Week 1): Pt will perform bed to chair transfer consistently with modA. PT Short Term Goal 3 (Week 1): Pt will perform sit to stand consistently with modA. PT Short Term Goal 4 (Week 1): Pt will initiate gait training.  Skilled Therapeutic Interventions/Progress Updates:  Pt resting in bed.  She reported abdominal pain and had just received meds. Husband present.  neuromuscular re-education via demo, multimodal cues in hookying-  bil lower trunk rotation to tolerance of R hip pain, bil hip adductor squeezes, bil bridging, bil hip adductor squeezes with bil bridging, R scapular protraction, cervical flexion, LLE mass flexion/extension.  Husband reported that pt is more comfortable rolling L to exit bed.  Rolling L with min assist, max assist to sit up.    Balance retraining in sitting EOB: R/L lateral leans >< midline.  Slide board transfer to R to wc, max assist.  Pt initially used RUE to press down on board , but then used R hand on armrest and began pushing away.  Pt may benefit from deflation of air bed for slide board transfers.  Towel roll place by L hip and bil hips scooted all the way back in tilt- in- space wc for symmetrical sitting posture.  Full sized tray attached to wc for support of LUE.  At end of session, pt in wc slightly tilted back, with husband in attendance.     Therapy Documentation Precautions:  Precautions Precautions: Fall Precaution Comments: L side weakness Restrictions Weight Bearing Restrictions: No       Therapy/Group: Individual Therapy  Janiyah Beery 10/24/2021, 4:12 PM

## 2021-10-24 NOTE — Progress Notes (Signed)
PROGRESS NOTE   Subjective/Complaints:    Pt reports hates the in/out caths- would rather do catheter/foley due to pain of cathing- even with lidocaine.   Bottom hurting a little.  Cotton mouth- thinks it's meds- driving her a little "Crazy".  LBM last night per husband,  Needs miralax daily.  Will check U/A and Cx due to urinary retention.    ROS:   Pt denies SOB, abd pain, CP, N/V/C/D, and vision changes   Objective:   No results found. No results for input(s): WBC, HGB, HCT, PLT in the last 72 hours.  No results for input(s): NA, K, CL, CO2, GLUCOSE, BUN, CREATININE, CALCIUM in the last 72 hours.   Intake/Output Summary (Last 24 hours) at 10/24/2021 1307 Last data filed at 10/24/2021 1302 Gross per 24 hour  Intake --  Output 2150 ml  Net -2150 ml        Physical Exam: Vital Signs Blood pressure (!) 131/53, pulse 83, temperature 98.9 F (37.2 C), temperature source Oral, resp. rate 17, height 5\' 8"  (1.727 m), weight 63.2 kg, SpO2 96 %.     General: awake, alert, appropriate,sitting up in bed; husband at bedside- great advocate;  NAD HENT: conjugate gaze; oropharynx very dry- due to dry mouth CV: regular rate; no JVD Pulmonary: a little wheeze on L side; otherwise great air movement GI: soft, NT, ND, (+)BS Psychiatric: appropriate/flat Neurological: alert- needs help with memory Ext: no clubbing, cyanosis, or edema Psych: pleasant and cooperative  Skin: ecchymoses and skin tears/wounds bilateral shins, stasis changes are stable to improved.  A few other scattered bruises Neuro:  Pt is alert, oriented to hospital, why she's here, month. Follows commands. Provides some biographical information. No focal CN findings. RUE 5/5. LUE 2-3/5 prox to distal. RLE 4-5/5. LLE 4/5. Mild left sided sensory deficits and inattention. Early flexor tone LUE--stable appearance Musculoskeletal: pain in left hip with PROM again  today.     Assessment/Plan: 1. Functional deficits which require 3+ hours per day of interdisciplinary therapy in a comprehensive inpatient rehab setting. Physiatrist is providing close team supervision and 24 hour management of active medical problems listed below. Physiatrist and rehab team continue to assess barriers to discharge/monitor patient progress toward functional and medical goals  Care Tool:  Bathing  Bathing activity did not occur: Safety/medical concerns Body parts bathed by patient: Left arm, Chest, Abdomen, Front perineal area, Right upper leg, Face   Body parts bathed by helper: Right arm, Buttocks, Left lower leg, Right lower leg, Left upper leg     Bathing assist Assist Level: Total Assistance - Patient < 25%     Upper Body Dressing/Undressing Upper body dressing   What is the patient wearing?: Pull over shirt    Upper body assist Assist Level: Total Assistance - Patient < 25%    Lower Body Dressing/Undressing Lower body dressing      What is the patient wearing?: Incontinence brief, Pants     Lower body assist Assist for lower body dressing: 2 Helpers     Toileting Toileting Toileting Activity did not occur (Clothing management and hygiene only): N/A (no void or bm)  Toileting assist Assist for toileting:  Dependent - Patient 0% (requires in and out cath)     Transfers Chair/bed transfer  Transfers assist     Chair/bed transfer assist level: Total Assistance - Patient < 25%     Locomotion Ambulation   Ambulation assist   Ambulation activity did not occur: Safety/medical concerns          Walk 10 feet activity   Assist  Walk 10 feet activity did not occur: Safety/medical concerns        Walk 50 feet activity   Assist Walk 50 feet with 2 turns activity did not occur: Safety/medical concerns         Walk 150 feet activity   Assist Walk 150 feet activity did not occur: Safety/medical concerns         Walk 10  feet on uneven surface  activity   Assist Walk 10 feet on uneven surfaces activity did not occur: Safety/medical concerns         Wheelchair     Assist Is the patient using a wheelchair?: Yes Type of Wheelchair: Manual    Wheelchair assist level: Dependent - Patient 0% Max wheelchair distance: 150'    Wheelchair 50 feet with 2 turns activity    Assist        Assist Level: Dependent - Patient 0%   Wheelchair 150 feet activity     Assist      Assist Level: Dependent - Patient 0%   Blood pressure (!) 131/53, pulse 83, temperature 98.9 F (37.2 C), temperature source Oral, resp. rate 17, height 5\' 8"  (1.727 m), weight 63.2 kg, SpO2 96 %.  Medical Problem List and Plan: 1. Functional deficits secondary to metastatic breast cancer             -patient may shower             -ELOS/Goals: 14-21 days            -Continue CIR- PT, OT and SLP 2.  Antithrombotics: -DVT/anticoagulation:  Mechanical: Sequential compression devices, below knee Bilateral lower extremities             -antiplatelet therapy: N/A 3. Pain Management: Oxycodone prn.  4. Mood: LCSW to follow for evaluation and support.              -antipsychotic agents: N/a 5. Neuropsych: This patient is capable of making decisions on her own behalf. 6. Skin/Wound Care: routine pressure relief measures.              --continue air mattress overlay to prevent breakdown. Prevalon boots for sore heels.              -continue xeroform, 4x4, kerlix to areas for protection. Appreciate WOC RN note      1/6 above areas clean, healing  1/7- bottom hurting- took pain meds for pain- con't regimen 7. Fluids/Electrolytes/Nutrition: Monitor I/O. Check lytes in am.  8. Focal status epilepticus:  Continue Keppra 2000 mg BID and Vimpat 50 mg BID.   -remains seizure free thus far on rehab             --Vimpat 50 mg IV every 12 hours prn breakthrough and Ativan 2 mg IV prn seizure lasting more than 5 minutes.  -Contact  neurology as needed for breakthrough seizure.  9. Hepatic cirrhosis: Ammonia levels remain elevated 47-->58. Continue lactulose 10 mg bid.              -1/6 both LFT's and ammonia level with further  increase today              -increased lactulose to 20mg  bid   -pt clinically looks ok today   -recheck labs Monday  1/7- LBM actually this afternoon- small- con't regimen 10. Breast cancer with extensive bony mets:discussed benefits of prehab for response to chemotherapy.   -multiple mets on imaging of pelvis 11. Cerebral mets: Continue decadron 4 mg daily.  12. Thrombocytopenia: Plts 92-->59-->51.    --1/5 plts up to 57k --> recheck monday 13. HTN: monitor BP TID--continue lasix and metoprolol.               --metoprolol decreased 01/04 due to hypotension.   1/7- BP controlled usually, but somewhat elevated this AM- con't regimen and monitor for trend 14. HLD: LDL 129 on 04/24/19: statin not recommended given elevated FLTs.  15. Urine retention:  1/6-u/a equivocal, check ucx   -oob to empty   -begin urecholine trial  1/8- will recheck U/A and Cx and place foley since needing cathing- and very painful for pt- even with lidocaine. Might benefit from flomax?  Cannot see the previous U/A but Cx growing >100k E Coli- will wait for sensitivities- allergic to Zosyn, fyi- will likely need F/U with Urology. Afebrile.  16. Constipation  1/8- will change miralax to daily instead of prn. Per husband request.     I spent a total of 39 minutes on total care- >50% on coordination of care- d/w husband about care- as well as d/w nursing and reviewing labs/urine Cx      LOS: 4 days A FACE TO FACE EVALUATION WAS PERFORMED  [name] Riley 10/24/2021, 1:07 PM

## 2021-10-24 NOTE — Progress Notes (Signed)
Speech Language Pathology Daily Session Note  Patient Details  Name: [name] Riley MRN: [id number] Date of Birth: [birth year]  Today's Date: 10/24/2021 SLP Individual Time: 1000-1030 SLP Individual Time Calculation (min): 30 min  Short Term Goals: Week 1: SLP Short Term Goal 1 (Week 1): Patient will utilize external aids to recall new, daily information with Mod A multimodal cues. SLP Short Term Goal 2 (Week 1): Patient will demonstrate functional problem solving for basic and familiar tasks with Mod A verbal cues. SLP Short Term Goal 3 (Week 1): Patient will attend to left field of enviornment during functional tasks with Mod A multimodal cues. SLP Short Term Goal 4 (Week 1): Patient will demonstrate sustianed attention to functional tasks for 30 minutes with Min verbal cues for redirection. SLP Short Term Goal 5 (Week 1): Patient will self-monitor and correct errors during functional tasks with Mod A verbal cues.  Skilled Therapeutic Interventions:  Pt was seen for skilled ST targeting cognitive goals.  Upon arrival, pt needed mod verbal cues for use of calendar to reorient to exact date.  SLP facilitated the session with a novel card game targeting visual scanning, sustained attention, and functional problem solving.  Pt needed max assist to complete task and could sustain her attention to game for ~2 minute intervals with min cues for redirection.  Pt was left in bed with bed alarm set and call bell within reach.  Continue per current plan of care.    Pain Pain Assessment Pain Scale: 0-10 Pain Score: 0-No pain  Therapy/Group: Individual Therapy  Zoie Sarin, Selinda Orion 10/24/2021, 10:26 AM

## 2021-10-25 ENCOUNTER — Inpatient Hospital Stay (HOSPITAL_COMMUNITY): Payer: Medicare Other

## 2021-10-25 LAB — CBC
HCT: 33.1 % — ABNORMAL LOW (ref 36.0–46.0)
Hemoglobin: 11.5 g/dL — ABNORMAL LOW (ref 12.0–15.0)
MCH: 35.7 pg — ABNORMAL HIGH (ref 26.0–34.0)
MCHC: 34.7 g/dL (ref 30.0–36.0)
MCV: 102.8 fL — ABNORMAL HIGH (ref 80.0–100.0)
Platelets: 65 10*3/uL — ABNORMAL LOW (ref 150–400)
RBC: 3.22 MIL/uL — ABNORMAL LOW (ref 3.87–5.11)
RDW: 13.9 % (ref 11.5–15.5)
WBC: 7.8 10*3/uL (ref 4.0–10.5)
nRBC: 0.3 % — ABNORMAL HIGH (ref 0.0–0.2)

## 2021-10-25 LAB — AMMONIA: Ammonia: 78 umol/L — ABNORMAL HIGH (ref 9–35)

## 2021-10-25 LAB — COMPREHENSIVE METABOLIC PANEL
ALT: 95 U/L — ABNORMAL HIGH (ref 0–44)
AST: 127 U/L — ABNORMAL HIGH (ref 15–41)
Albumin: 2.5 g/dL — ABNORMAL LOW (ref 3.5–5.0)
Alkaline Phosphatase: 437 U/L — ABNORMAL HIGH (ref 38–126)
Anion gap: 8 (ref 5–15)
BUN: 21 mg/dL (ref 8–23)
CO2: 28 mmol/L (ref 22–32)
Calcium: 8.2 mg/dL — ABNORMAL LOW (ref 8.9–10.3)
Chloride: 98 mmol/L (ref 98–111)
Creatinine, Ser: 0.61 mg/dL (ref 0.44–1.00)
GFR, Estimated: >60 mL/min (ref >60)
Glucose, Bld: 80 mg/dL (ref 70–99)
Potassium: 3.8 mmol/L (ref 3.5–5.1)
Sodium: 134 mmol/L — ABNORMAL LOW (ref 135–145)
Total Bilirubin: 0.9 mg/dL (ref 0.3–1.2)
Total Protein: 5.2 g/dL — ABNORMAL LOW (ref 6.5–8.1)

## 2021-10-25 LAB — URINE CULTURE: Culture: >=100000 — AB

## 2021-10-25 MED ORDER — SODIUM CHLORIDE 0.9 % IV SOLN
150.0000 mg | Freq: Once | INTRAVENOUS | Status: AC
  Administered 2021-10-26: 150 mg via INTRAVENOUS
  Filled 2021-10-25: qty 15

## 2021-10-25 MED ORDER — ACETAMINOPHEN 325 MG PO TABS
325.0000 mg | ORAL_TABLET | ORAL | Status: AC | PRN
Start: 2021-10-25 — End: ?

## 2021-10-25 MED ORDER — LACOSAMIDE 50 MG PO TABS
100.0000 mg | ORAL_TABLET | Freq: Two times a day (BID) | ORAL | Status: DC
  Administered 2021-10-26 – 2021-10-28 (×5): 100 mg via ORAL
  Filled 2021-10-25 (×5): qty 2

## 2021-10-25 MED ORDER — DEXAMETHASONE 4 MG PO TABS
4.0000 mg | ORAL_TABLET | Freq: Three times a day (TID) | ORAL | Status: DC
  Administered 2021-10-25 – 2021-10-28 (×10): 4 mg via ORAL
  Filled 2021-10-25 (×10): qty 1

## 2021-10-25 MED ORDER — LACTULOSE 10 GM/15ML PO SOLN
20.0000 g | ORAL | Status: AC
  Administered 2021-10-25: 20 g via ORAL
  Filled 2021-10-25: qty 30

## 2021-10-25 MED ORDER — LACTULOSE 10 GM/15ML PO SOLN
30.0000 g | Freq: Three times a day (TID) | ORAL | Status: DC
  Administered 2021-10-25 – 2021-10-28 (×9): 30 g via ORAL
  Filled 2021-10-25 (×7): qty 45

## 2021-10-25 MED ORDER — SULFAMETHOXAZOLE-TRIMETHOPRIM 800-160 MG PO TABS
1.0000 | ORAL_TABLET | Freq: Two times a day (BID) | ORAL | Status: DC
  Administered 2021-10-25 – 2021-10-28 (×7): 1 via ORAL
  Filled 2021-10-25 (×7): qty 1

## 2021-10-25 MED ORDER — POLYETHYLENE GLYCOL 3350 17 G PO PACK
17.0000 g | PACK | Freq: Every day | ORAL | Status: DC | PRN
  Administered 2021-10-26: 17 g via ORAL
  Filled 2021-10-25: qty 1

## 2021-10-25 MED ORDER — SODIUM CHLORIDE 0.9% FLUSH: 10.0000 mL | INTRAVENOUS | Status: DC | PRN

## 2021-10-25 MED FILL — Dexamethasone Sodium Phosphate Inj 100 MG/10ML: INTRAMUSCULAR | Qty: 1 | Status: AC

## 2021-10-25 NOTE — Progress Notes (Addendum)
PROGRESS NOTE   Subjective/Complaints:    Pt reports some groin discomfort. Left hip feels ok. Working on her coloring book. Moved bowels this weekend. OT noticed further weakness in LUE this morning while working in therapy  ROS: Patient denies fever, rash, sore throat, blurred vision, nausea, vomiting, diarrhea, cough, shortness of breath or chest pain,   headache, or mood change.        Objective:   No results found. Recent Labs    10/25/21 0521  WBC 7.8  HGB 11.5*  HCT 33.1*  PLT 65*    Recent Labs    10/25/21 0521  NA 134*  K 3.8  CL 98  CO2 28  GLUCOSE 80  BUN 21  CREATININE 0.61  CALCIUM 8.2*     Intake/Output Summary (Last 24 hours) at 10/25/2021 0840 Last data filed at 10/25/2021 0242 Gross per 24 hour  Intake 417 ml  Output 1050 ml  Net -633 ml        Physical Exam: Vital Signs Blood pressure 139/69, pulse 82, temperature 97.7 F (36.5 C), resp. rate 16, height 5\' 8"  (1.727 m), weight 63.2 kg, SpO2 96 %.     Constitutional: No distress . Vital signs reviewed. HEENT: NCAT, EOMI, oral membranes moist Neck: supple Cardiovascular: RRR without murmur. No JVD    Respiratory/Chest: CTA Bilaterally without wheezes or rales. Normal effort    GI/Abdomen: BS +, non-tender, non-distended Ext: no clubbing, cyanosis, or edema Psych: pleasant and cooperative  Skin: ecchymoses and skin tears/wounds bilateral shins, stasis changes are stable to improved.  A few other scattered bruises--stable Neuro:  pt is alert, delayed. Follows commands, stm deficits, limited insight.    No focal CN findings. RUE 5/5. LUE 0-tr/5 prox to distal. RLE 4-5/5. LLE 4/5. Mild left sided sensory deficits and inattention. No flexor tone LUE--no motor changes Musculoskeletal: left hip less tender  Assessment/Plan: 1. Functional deficits which require 3+ hours per day of interdisciplinary therapy in a comprehensive inpatient  rehab setting. Physiatrist is providing close team supervision and 24 hour management of active medical problems listed below. Physiatrist and rehab team continue to assess barriers to discharge/monitor patient progress toward functional and medical goals  Care Tool:  Bathing  Bathing activity did not occur: Safety/medical concerns Body parts bathed by patient: Left arm, Chest, Abdomen, Front perineal area, Right upper leg, Face   Body parts bathed by helper: Right arm, Buttocks, Left lower leg, Right lower leg, Left upper leg     Bathing assist Assist Level: Total Assistance - Patient < 25%     Upper Body Dressing/Undressing Upper body dressing   What is the patient wearing?: Pull over shirt    Upper body assist Assist Level: Total Assistance - Patient < 25%    Lower Body Dressing/Undressing Lower body dressing      What is the patient wearing?: Incontinence brief, Pants     Lower body assist Assist for lower body dressing: 2 Helpers     Toileting Toileting Toileting Activity did not occur (Clothing management and hygiene only): N/A (no void or bm)  Toileting assist Assist for toileting: Dependent - Patient 0% (requires in and out cath)  Transfers Chair/bed transfer  Transfers assist     Chair/bed transfer assist level: Maximal Assistance - Patient 25 - 49%     Locomotion Ambulation   Ambulation assist   Ambulation activity did not occur: Safety/medical concerns          Walk 10 feet activity   Assist  Walk 10 feet activity did not occur: Safety/medical concerns        Walk 50 feet activity   Assist Walk 50 feet with 2 turns activity did not occur: Safety/medical concerns         Walk 150 feet activity   Assist Walk 150 feet activity did not occur: Safety/medical concerns         Walk 10 feet on uneven surface  activity   Assist Walk 10 feet on uneven surfaces activity did not occur: Safety/medical concerns          Wheelchair     Assist Is the patient using a wheelchair?: Yes Type of Wheelchair: Manual    Wheelchair assist level: Dependent - Patient 0% Max wheelchair distance: 150'    Wheelchair 50 feet with 2 turns activity    Assist        Assist Level: Dependent - Patient 0%   Wheelchair 150 feet activity     Assist      Assist Level: Dependent - Patient 0%   Blood pressure 139/69, pulse 82, temperature 97.7 F (36.5 C), resp. rate 16, height 5\' 8"  (1.727 m), weight 63.2 kg, SpO2 96 %.  Medical Problem List and Plan: 1. Functional deficits secondary to metastatic breast cancer             -patient may shower             -ELOS/Goals: 14-21 days            -Continue CIR- PT, OT and SLP  -pt likely with progressive disease, edema in right F-P area. Will increase decadron to 4mg  tid. I have reached out to onc/neuro-onc for recs as well.  2.  Antithrombotics: -DVT/anticoagulation:  Mechanical: Sequential compression devices, below knee Bilateral lower extremities             -antiplatelet therapy: N/A 3. Pain Management: Oxycodone prn.  4. Mood: LCSW to follow for evaluation and support.              -antipsychotic agents: N/a 5. Neuropsych: This patient is capable of making decisions on her own behalf. 6. Skin/Wound Care: routine pressure relief measures.              --continue air mattress overlay to prevent breakdown. Prevalon boots for sore heels.              -continue xeroform, 4x4, kerlix to areas for protection. Appreciate WOC RN note     1/6 above areas clean, healing  1/9 continue current care 7. Fluids/Electrolytes/Nutrition: Monitor I/O. Check lytes in am.  8. Focal status epilepticus:  Continue Keppra 2000 mg BID and Vimpat 50 mg BID.   -remains seizure free thus far on rehab             --Vimpat 50 mg IV every 12 hours prn breakthrough and Ativan 2 mg IV prn seizure lasting more than 5 minutes.  -Contact neurology as needed for breakthrough seizure.   9. Hepatic cirrhosis: Ammonia levels remain elevated 47-->58. Continue lactulose 10 mg bid.              -1/6 both LFT's  and ammonia level with further increase today              -increased lactulose to 20mg  bid   -pt clinically looks ok today   -recheck labs Monday  1/9 ammonia up to 78, increase lactulose to 30mg  tid   -recheck level Wednesday    -LFT's also slowly trending up   -consider conference with onc/GI 10. Breast cancer with extensive bony mets:discussed benefits of prehab for response to chemotherapy.   -multiple mets on imaging of pelvis 11. Cerebral mets: Continue decadron 4 mg daily.  12. Thrombocytopenia: Plts 92-->59-->51.    --1/9 plts up to 65k 13. HTN: monitor BP TID--continue lasix and metoprolol.               --metoprolol decreased 01/04 due to hypotension.   1/9 continue regimen 14. HLD: LDL 129 on 04/24/19: statin not recommended given elevated FLTs.  15. Urine retention:  1/6-u/a equivocal, check ucx   -oob to empty   -begin urecholine trial  1/9 100k e coli, previously sensitive to sulfa--begin bactrim -current sens pending 16. Constipation  1/9 miralax --change back to prn with increase of lactulose  -lactulose as above      At least 35 total minutes were spent in examination of patient, assessment of pertinent data,  formulation of a treatment plan, and in discussion with patient and/or family.     LOS: 5 days A FACE TO FACE EVALUATION WAS PERFORMED  Meredith Staggers 10/25/2021, 8:40 AM

## 2021-10-25 NOTE — Progress Notes (Signed)
Occupational Therapy Session Note  Patient Details  Name: [name] Riley MRN: [id number] Date of Birth: [birth year]  Today's Date: 10/25/2021 OT Individual Time: 4098-1191 OT Individual Time Calculation (min): 23 min    Short Term Goals: Week 1:  OT Short Term Goal 1 (Week 1): Patient will tolerate sitting EOB for 10 minutes in preparation for BADL taks OT Short Term Goal 2 (Week 1): Pt will use L UE as a stabilizer with moderate assistance within BADL task OT Short Term Goal 3 (Week 1): Patient will complete toilet transfer with max A of 2 and LRAD   Skilled Therapeutic Interventions/Progress Updates:    OT presented to pt room at time of session and pt out of room for MRI. Followed up later and pt in room, agreeable to OT session. Note spouse present in room who remained throughout session. Focus of session on bimanual tasks to incorporate LUE with OT providing total A to manage and move LUE as pt moved RUE for the following: shoulder circles, punches toward ceiling, clapping, jab punches, elbow flexion/extension, and wrist flexion/extension. Cues to attend to L side throughout session and proprioceptive input/pressure applied for LUE to attend. Pt resting bed level fatigued from the day.Call bell in reach and all needs met.   Therapy Documentation Precautions:  Precautions Precautions: Fall Precaution Comments: L side weakness Restrictions Weight Bearing Restrictions: No     Therapy/Group: Individual Therapy  Viona Gilmore 10/25/2021, 12:58 PM

## 2021-10-25 NOTE — Discharge Instructions (Addendum)
Inpatient Rehab Discharge Instructions  [name] Riley Discharge date and time:  10/28/2021  Activities/Precautions/ Functional Status: Activity: no lifting, driving, or strenuous exercise for till cleared by MD Diet: cardiac diet Wound Care: keep wound clean and dry   Functional status:  ___ No restrictions     ___ Walk up steps independently _X_ 24/7 supervision/assistance   ___ Walk up steps with assistance ___ Intermittent supervision/assistance  ___ Bathe/dress independently ___ Walk with walker     _x__ Bathe/dress with assistance ___ Walk Independently    ___ Shower independently ___ Walk with assistance    ___ Shower with assistance _X__ No alcohol     ___ Return to work/school ________   Special Instructions:  No driving, alcohol consumption or tobacco use.   My questions have been answered and I understand these instructions. I will adhere to these goals and the provided educational materials after my discharge from the hospital.  Patient/Caregiver Signature _______________________________ Date __________  Clinician Signature _______________________________________ Date __________  Please bring this form and your medication list with you to all your follow-up doctor's appointments.

## 2021-10-25 NOTE — Progress Notes (Signed)
Occupational Therapy Session Note  Patient Details  Name: [name] Riley MRN: [id number] Date of Birth: 17-Aug-1951  Today's Date: 10/25/2021 OT Individual Time: 6629-4765 OT Individual Time Calculation (min): 55 min   Short Term Goals: Week 1:  OT Short Term Goal 1 (Week 1): Patient will tolerate sitting EOB for 10 minutes in preparation for BADL taks OT Short Term Goal 2 (Week 1): Pt will use L UE as a stabilizer with moderate assistance within BADL task OT Short Term Goal 3 (Week 1): Patient will complete toilet transfer with max A of 2 and LRAD  Skilled Therapeutic Interventions/Progress Updates:    Pt greeted sitting upright in bed, more awake this morning and agreeable to OT treatment session. Bed level LB bathing/dressing completed with total A, but max A to roll L and total A to roll L. Worked on hip bridging in bed with pt able to bend L knee with guided A from OT, then maintain position for short time to try to bridge. Unable to get hips high enough to pull up pants. Pt then transitioned to sitting EOB with total A. Worked on sitting balance at EOB with lateral lean to the L. OT had pt lean down through R elbow to decrease pushing. Pt needed min A static sitting, and mod A dynamic sitting. Pt's L UE completely flaccid today. Pt unable to activate even trace movement when she was a [place] 3 on eval. Max A for UB bathing/dressing at EOB. Total A slideboard transfer from bed to TIS wc with patient fearful and needing cues to help push up and let go of bedrail. Pt performed toothbrushing task in TIS wc with min set-up A. Pt left seated in TIS wc with alarn belt on, call bell in reach, and needs met.   Therapy Documentation Precautions:  Precautions Precautions: Fall Precaution Comments: L side weakness Restrictions Weight Bearing Restrictions: No Pain: Pain Assessment Pain Scale: 0-10 Pain Score: Pt reported pain in pelvic area, no number given. Rest and repositioned for  comfort.  Therapy/Group: Individual Therapy  Valma Cava 10/25/2021, 9:50 AM

## 2021-10-25 NOTE — Consult Note (Signed)
Neurology Progress Note   S:// Pt known to neurology service -metastatic cerebral lesions with recurrent seizures on Keppra 2020 and Vimpat 50 twice daily started during this admission and transferred over to PMR for rehabilitation-concern for worsening left-sided weakness.  Patient persistently endorses left arm weakness has been going on for past 2 days to the point where she cannot move it at all.  Left upper extremity was plegic on 230 2022 with mild improvement over the next couple of days-unclear as to what her strength was on the left arm.  Left lower extremity was noted to be somewhat antigravity which she is currently as well. Case was discussed with the on-call neurologist to review the recommended routine EEG.  Preliminary review of the LTM EEG with Dr. Hortense Ramal with more rhythmic changes on the EEG on the right hemisphere-could reflect underlying electrographic abnormalities.   O:// Current vital signs: BP (!) 103/57 (BP Location: Right Arm)    Pulse 72    Temp 98.3 F (36.8 C) (Oral)    Resp 16    Ht 5\' 8"  (1.727 m)    Wt 63.2 kg    SpO2 98%    BMI 21.19 kg/m  Vital signs in last 24 hours: Temp:  [97.7 F (36.5 C)-98.3 F (36.8 C)] 98.3 F (36.8 C) (01/09 1950) Pulse Rate:  [72-82] 72 (01/09 1950) Resp:  [16-17] 16 (01/09 1950) BP: (103-152)/(57-77) 103/57 (01/09 1950) SpO2:  [96 %-98 %] 98 % (01/09 1950) General: Awake alert in no distress examination: Symptomatic Lungs: Clear Vascular: Regular rhythm Extremities: Left upper extremity with edema, left lower extremity in bandage Neurological exam Awake alert oriented to self.  Oriented to the fact is in the hospital. Got her age wrong first try and then corrected it to 71 (she is 42 about to turn 49 in couple of weeks) Got month right. Year wrong. Speech clear.  Poor attention concentration.  Naming intact, repetition intact, comprehension preserved commands intact. Cranial nerves: Pupils equal round reactive extract  movements intact, visual field examination-difficult it does appear that she might have mild left homonymous hemianopsia or quadrantanopsia but given her poor attention concentration, that is hard to ascertain.  Face symmetric.  Facial sensation intact.  Tongue and palate midline. Motor exam nearly plegic left upper extremity and barely antigravity 3/5 left lower extremity.  Right side is full strength. Sensation intact to touch in both sides but she does seem to neglect the left side somewhat. Coordination difficult to assess   Medications  Current Facility-Administered Medications:    acetaminophen (TYLENOL) tablet 325-650 mg, 325-650 mg, Oral, Q4H PRN, Love, Pamela S, PA-C, 650 mg at 10/24/21 1510   alum & mag hydroxide-simeth (MAALOX/MYLANTA) 200-200-20 MG/5ML suspension 30 mL, 30 mL, Oral, Q4H PRN, Love, Pamela S, PA-C, 30 mL at 10/24/21 1509   bisacodyl (DULCOLAX) suppository 10 mg, 10 mg, Rectal, Daily PRN, Love, Pamela S, PA-C, 10 mg at 10/23/21 1840   Chlorhexidine Gluconate Cloth 2 % PADS 6 each, 6 each, Topical, Daily, Meredith Staggers, MD, 6 each at 10/25/21 0830   dexamethasone (DECADRON) tablet 4 mg, 4 mg, Oral, Q8H, Alger Simons T, MD, 4 mg at 10/25/21 2105   diphenhydrAMINE (BENADRYL) 12.5 MG/5ML elixir 12.5-25 mg, 12.5-25 mg, Oral, Q6H PRN, Love, Pamela S, PA-C   furosemide (LASIX) tablet 20 mg, 20 mg, Oral, Daily, Love, Pamela S, PA-C, 20 mg at 10/25/21 0827   guaiFENesin-dextromethorphan (ROBITUSSIN DM) 100-10 MG/5ML syrup 5-10 mL, 5-10 mL, Oral, Q6H PRN, Reesa Chew  S, PA-C   heparin lock flush 100 unit/mL, 500 Units, Intracatheter, Q30 days **AND** heparin lock flush 100 unit/mL, 500 Units, Intracatheter, PRN, Meredith Staggers, MD, 500 Units at 10/21/21 1706   lacosamide (VIMPAT) 50 mg in sodium chloride 0.9 % 25 mL IVPB, 50 mg, Intravenous, Q12H PRN, Love, Pamela S, PA-C   lacosamide (VIMPAT) tablet 50 mg, 50 mg, Oral, Q12H, Love, Pamela S, PA-C, 50 mg at 10/25/21  2100   lactulose (CHRONULAC) 10 GM/15ML solution 30 g, 30 g, Oral, TID, Meredith Staggers, MD, 30 g at 10/25/21 2100   levETIRAcetam (KEPPRA) 2,000 mg in sodium chloride 0.9 % 250 mL IVPB, 2,000 mg, Intravenous, Q12H PRN, Meredith Staggers, MD   levETIRAcetam (KEPPRA) tablet 2,000 mg, 2,000 mg, Oral, BID, Alger Simons T, MD, 2,000 mg at 10/25/21 2100   levothyroxine (SYNTHROID) tablet 100 mcg, 100 mcg, Oral, Q0600, Bary Leriche, PA-C, 100 mcg at 10/25/21 0541   lidocaine (XYLOCAINE) 2 % jelly 1 application, 1 application, Topical, PRN, Love, Pamela S, PA-C   LORazepam (ATIVAN) injection 2 mg, 2 mg, Intravenous, Q4H PRN, Love, Pamela S, PA-C   metoprolol tartrate (LOPRESSOR) tablet 12.5 mg, 12.5 mg, Oral, BID, Love, Pamela S, PA-C, 12.5 mg at 10/25/21 3785   oxyCODONE-acetaminophen (PERCOCET/ROXICET) 5-325 MG per tablet 1 tablet, 1 tablet, Oral, Q4H PRN, Love, Pamela S, PA-C, 1 tablet at 10/25/21 1103   pantoprazole (PROTONIX) EC tablet 40 mg, 40 mg, Oral, Daily, Love, Pamela S, PA-C, 40 mg at 10/25/21 8850   polyethylene glycol (MIRALAX / GLYCOLAX) packet 17 g, 17 g, Oral, Daily PRN, Meredith Staggers, MD   prochlorperazine (COMPAZINE) tablet 5-10 mg, 5-10 mg, Oral, Q6H PRN **OR** prochlorperazine (COMPAZINE) injection 5-10 mg, 5-10 mg, Intramuscular, Q6H PRN **OR** prochlorperazine (COMPAZINE) suppository 12.5 mg, 12.5 mg, Rectal, Q6H PRN, Love, Pamela S, PA-C   sodium chloride flush (NS) 0.9 % injection 10-40 mL, 10-40 mL, Intracatheter, Q12H, Love, Pamela S, PA-C   sodium chloride flush (NS) 0.9 % injection 10-40 mL, 10-40 mL, Intracatheter, PRN, Love, Pamela S, PA-C   sodium phosphate (FLEET) 7-19 GM/118ML enema 1 enema, 1 enema, Rectal, Once PRN, Love, Pamela S, PA-C   sulfamethoxazole-trimethoprim (BACTRIM DS) 800-160 MG per tablet 1 tablet, 1 tablet, Oral, Q12H, Meredith Staggers, MD, 1 tablet at 10/25/21 2101   traZODone (DESYREL) tablet 50 mg, 50 mg, Oral, QHS, Bary Leriche, PA-C, 50  mg at 10/25/21 2105 Labs CBC    Component Value Date/Time   WBC 7.8 10/25/2021 0521   RBC 3.22 (L) 10/25/2021 0521   HGB 11.5 (L) 10/25/2021 0521   HGB 11.0 (L) 09/27/2021 1001   HGB 11.8 08/12/2021 0918   HGB 11.5 (L) 09/22/2017 0958   HCT 33.1 (L) 10/25/2021 0521   HCT 33.5 (L) 08/12/2021 0918   HCT 34.9 09/22/2017 0958   PLT 65 (L) 10/25/2021 0521   PLT 91 (L) 09/27/2021 1001   PLT 122 (L) 08/12/2021 0918   MCV 102.8 (H) 10/25/2021 0521   MCV 104 (H) 08/12/2021 0918   MCV 94.3 09/22/2017 0958   MCH 35.7 (H) 10/25/2021 0521   MCHC 34.7 10/25/2021 0521   RDW 13.9 10/25/2021 0521   RDW 13.5 08/12/2021 0918   RDW 15.3 (H) 09/22/2017 0958   LYMPHSABS 1.6 10/21/2021 0606   LYMPHSABS 1.5 08/12/2021 0918   LYMPHSABS 2.2 09/22/2017 0958   MONOABS 0.7 10/21/2021 0606   MONOABS 0.8 09/22/2017 0958   EOSABS 0.0 10/21/2021 0606   EOSABS  0.1 08/12/2021 0918   BASOSABS 0.0 10/21/2021 0606   BASOSABS 0.1 08/12/2021 0918   BASOSABS 0.0 09/22/2017 0958    CMP     Component Value Date/Time   NA 134 (L) 10/25/2021 0521   NA 140 08/12/2021 0918   NA 140 09/22/2017 0958   K 3.8 10/25/2021 0521   K 3.7 09/22/2017 0958   CL 98 10/25/2021 0521   CL 105 08/10/2012 0908   CO2 28 10/25/2021 0521   CO2 26 09/22/2017 0958   GLUCOSE 80 10/25/2021 0521   GLUCOSE 80 09/22/2017 0958   GLUCOSE 81 08/10/2012 0908   BUN 21 10/25/2021 0521   BUN 12 08/12/2021 0918   BUN 12.8 09/22/2017 0958   CREATININE 0.61 10/25/2021 0521   CREATININE 0.67 09/27/2021 1001   CREATININE 0.6 09/22/2017 0958   CALCIUM 8.2 (L) 10/25/2021 0521   CALCIUM 9.1 09/22/2017 0958   PROT 5.2 (L) 10/25/2021 0521   PROT 6.5 08/12/2021 0918   PROT 7.0 09/22/2017 0958   ALBUMIN 2.5 (L) 10/25/2021 0521   ALBUMIN 3.8 08/12/2021 0918   ALBUMIN 4.0 09/22/2017 0958   AST 127 (H) 10/25/2021 0521   AST 99 (H) 09/27/2021 1001   AST 37 (H) 09/22/2017 0958   ALT 95 (H) 10/25/2021 0521   ALT 72 (H) 09/27/2021 1001   ALT 31  09/22/2017 0958   ALKPHOS 437 (H) 10/25/2021 0521   ALKPHOS 140 09/22/2017 0958   BILITOT 0.9 10/25/2021 0521   BILITOT 1.0 09/27/2021 1001   BILITOT 0.41 09/22/2017 0958   GFRNONAA >60 10/25/2021 0521   GFRNONAA >60 09/27/2021 1001   GFRAA 76 09/02/2020 0806   GFRAA >60 07/10/2020 0820     Imaging I have reviewed images in epic and the results pertinent to this consultation are: MRI brain today without contrast with mildly increased extensive vasogenic edema in the right frontoparietal region.  No acute stroke.  Similar noncontrast appearance of areas of susceptibility artifact in the right frontal and parietal lobes-better characterized on prior contrast-enhanced MRI  Assessment: 71 year old woman with metastatic brain disease who had been seen for recurrent seizures and started on Keppra and Vimpat-now with with concern for increasing left-sided weakness-concern for seizures and postictal weakness. Patient is a poor historian-I am not sure when her weakness actually started. MRI of the brain shows similar to somewhat worsening cerebral edema in the right hemisphere with known metastases. Preliminary EEG read concerning for rhythmicity in the right hemisphere which might indicate potential ictal-interictal activity. Increasing AED might be worthwhile but the exam is likely all related to the somewhat progressive vasogenic edema.  Recommendations: -Load of Vimpat 150 mg x1 and then Increase vimpat to 100 bid standing dose. -Not sure why a PRN dose of vimpat is ordered - will delete that. -Continue Keppra 2g BID -Continue LTM -Maintain seizure precautions. -Continue Dexamethasone at 4mg  q8h. Will defer to neuro-onc for any changes to be recommended as she has not tolerated steroids well.  Neurology will follow -- Amie [place], MD Neurologist [facility] Pager: [phone]

## 2021-10-25 NOTE — Progress Notes (Signed)
MRI reviewed and doesn't reveal hemorrhage or acute infarct. There is increased vasogenic edema in the right fronto-parietal area. I'm unclear how quickly there was a change in her LUE neuro exam as she's a poor historian. Cannot rule out that she's had unwitnessed seizures with post-ictal weakness. Discussed case with Dr. Mickeal Skinner earlier today.  [name] Riley has reached out to Dr. Hortense Ramal regarding her opinion re: seizures. Pt clinically stable at this point although quite fatigued in afternoon from attempts at therapy. Will discuss more about the rehab plan tomorrow in team conference. May have to involve palliative care/hospice given her clinical picture.    [name] Staggers, MD, [place] Director Rehabilitation Services 10/25/2021

## 2021-10-25 NOTE — Progress Notes (Signed)
Speech Language Pathology Daily Session Note  Patient Details  Name: CHARIDY CAPPELLETTI MRN: [id number] Date of Birth: 1950-10-24  Today's Date: 10/25/2021  Session 1: SLP Individual Time: 0700-0740 SLP Individual Time Calculation (min): 40 min  Session 2: SLP Individual Time: 1050-1100 SLP Individual Time Calculation (min): 10 min Missed Time: 20 minutes, fatigue   Short Term Goals: Week 1: SLP Short Term Goal 1 (Week 1): Patient will utilize external aids to recall new, daily information with Mod A multimodal cues. SLP Short Term Goal 2 (Week 1): Patient will demonstrate functional problem solving for basic and familiar tasks with Mod A verbal cues. SLP Short Term Goal 3 (Week 1): Patient will attend to left field of enviornment during functional tasks with Mod A multimodal cues. SLP Short Term Goal 4 (Week 1): Patient will demonstrate sustianed attention to functional tasks for 30 minutes with Min verbal cues for redirection. SLP Short Term Goal 5 (Week 1): Patient will self-monitor and correct errors during functional tasks with Mod A verbal cues.  Skilled Therapeutic Interventions:   Session 1: Skilled treatment session focused on cognitive goals. SLP facilitated session by providing Mod verbal cues for sustained attention and attention to left field of environment while self-feeding her breakfast meal.  Patient verbose and tangential with Mod verbal cues needed for redirection. SLP also provided Max verbal and visual cues for patient to utilize her calendar for recall of date. Overall, patient appeared more lethargic with decreased attention to task today. Patient left upright in bed with alarm on and all needs within reach. Continue with current plan of care.   Session 2: Skilled treatment session focused on cognitive goals. Upon arrival, patient was asleep in the tilt-in-space wheelchair and reported pelvic pain. RN made aware. Patient also reported lethargy and fatigue from "too much  therapy" and would instantly fall asleep if not provided any external stimulation. Patient also appeared to have an increased inattention to the left and a rate gaze preference while sitting upright. Patient missed remaining 20 minutes of session due to fatigue. Continue with current plan of care.      Pain  Session 1: No/Denies Pain Session 2: 8/10 pain in pelvis, RN aware and patient repositioned   Therapy/Group: Individual Therapy  Amarrah Meinhart 10/25/2021, 3:30 PM

## 2021-10-25 NOTE — Progress Notes (Signed)
vLTM started all imp below 10kohms  [facility] to monitor  Patient event button tested

## 2021-10-25 NOTE — Progress Notes (Signed)
Physical Therapy Session Note  Patient Details  Name: [name] Riley MRN: [id number] Date of Birth: [birth year]  Today's Date: 10/25/2021 PT Missed Time: 80 Minutes Missed Time Reason: Patient fatigue  Short Term Goals: Week 1:  PT Short Term Goal 1 (Week 1): Pt will perform supine<>sit consistently with modA. PT Short Term Goal 2 (Week 1): Pt will perform bed to chair transfer consistently with modA. PT Short Term Goal 3 (Week 1): Pt will perform sit to stand consistently with modA. PT Short Term Goal 4 (Week 1): Pt will initiate gait training.  Skilled Therapeutic Interventions/Progress Updates:     Pt asleep upon PT arrival. PT will follow up as able.   Therapy Documentation Precautions:  Precautions Precautions: Fall Precaution Comments: L side weakness Restrictions Weight Bearing Restrictions: No   Therapy/Group: Individual Therapy  Breck Coons, PT, DPT 10/25/2021, 1:15 PM

## 2021-10-26 ENCOUNTER — Inpatient Hospital Stay: Payer: Medicare Other

## 2021-10-26 ENCOUNTER — Inpatient Hospital Stay: Payer: Medicare Other | Admitting: Hematology and Oncology

## 2021-10-26 DIAGNOSIS — R569 Unspecified convulsions: Secondary | ICD-10-CM

## 2021-10-26 DIAGNOSIS — Z7189 Other specified counseling: Secondary | ICD-10-CM

## 2021-10-26 DIAGNOSIS — Z515 Encounter for palliative care: Secondary | ICD-10-CM

## 2021-10-26 NOTE — Progress Notes (Signed)
Occupational Therapy Session Note  Patient Details  Name: [name] Riley MRN: [id number] Date of Birth: 01-30-1951  Today's Date: 10/26/2021 OT Individual Time: 5051-0712 OT Individual Time Calculation (min): 45 min  and Today's Date: 10/26/2021 OT Missed Time: 15 Minutes Missed Time Reason: Patient fatigue   Short Term Goals: Week 1:  OT Short Term Goal 1 (Week 1): Patient will tolerate sitting EOB for 10 minutes in preparation for BADL taks OT Short Term Goal 2 (Week 1): Pt will use L UE as a stabilizer with moderate assistance within BADL task OT Short Term Goal 3 (Week 1): Patient will complete toilet transfer with max A of 2 and LRAD  Skilled Therapeutic Interventions/Progress Updates:    Pt greeted semi-reclined in bed asleep, on EEG, but easy to wake. Nursing stated pt was ok to sit EOB. Pt completed bed mobility with total A, then mod A initially for sitting balance with intermittent bouts of CGA A,  but then requiring mod to max A with fatigue. OT used pillow on R side and elbow lean to decrease lean to L, but pt then unable to keep head at midline with strong R gaze preference. Worked on visual scanning and head turns to the left while seated EOB. OT provided max multimodal cues to get pt to attend to L UE. She would look at L arm, but unable to demonstrate any muscle activation. Hand over hand A for gentle self ROM of L UE while EOB. Pt was able to follow commands to wash face, but needed increased assist for sitting balance with increased task demand. TOTal A to change gown at EOB. Pt reported max fatigue and requested to return to supine with total A. Pt with smear of bM in brief requiring total A for rolling, prei-care, and brief change. Pt left in pressure relief position on her L side with pillow support. Needs met and call bell in reach.  Therapy Documentation Precautions:  Precautions Precautions: Fall Precaution Comments: L side weakness Restrictions Weight Bearing  Restrictions: No Pain:  Pt reports pain in abdomen, no number given. Rest and repositioned for comfort   Therapy/Group: Individual Therapy  Valma Cava 10/26/2021, 10:24 AM

## 2021-10-26 NOTE — Procedures (Addendum)
Patient Name: [name] Riley  MRN: [id number]  Epilepsy Attending: Lora Havens  Referring Physician/Provider: Dr Kerney Elbe Duration: 10/25/2020 1806 to 10/26/2020 1211   Patient history: 71 year old female with metastatic brain disease presented with seizure. EEG to evaluate for seizure.   Level of alertness: Awake, asleep   AEDs during EEG study: LEV   Technical aspects: This EEG study was done with scalp electrodes positioned according to the 10-20 International system of electrode placement. Electrical activity was acquired at a sampling rate of 500Hz  and reviewed with a high frequency filter of 70Hz  and a low frequency filter of 1Hz . EEG data were recorded continuously and digitally stored.    Description: The posterior dominant rhythm consists of 9 Hz activity of moderate voltage (25-35 uV) seen predominantly in posterior head regions, symmetric and reactive to eye opening and eye closing.  Sleep was characterized by vertex waves, sleep spindles (12 to 14 Hz), maximum frontocentral region.   EEG showed continuous 5 to 6 Hz theta slowing in right hemisphere, maximal right temporo-parietal region. Spikes were also noted in right temporo-parietal region, which at times appear periodic at 1hz  and rhythmic without definite evolution. Hyperventilation and photic stimulation were not performed.       ABNORMALITY - Lateralized periodic discharges, right hemisphere, right temporo-parietal region (LPD+R) - Continuous slow, right hemisphere, maximal right temporo-parietal region   IMPRESSION: This study showed epileptogenicity and cortical dysfunction arising from right hemisphere, right temporo-parietal region likely secondary to underlying mass. The lateralized periodic discharges were at times rhythmic which is on the ictal-interictal region with high potential for seizure recurrence. No definite seizures were seen during this study.    Binnie Vonderhaar [name]

## 2021-10-26 NOTE — Patient Care Conference (Signed)
Inpatient RehabilitationTeam Conference and Plan of Care Update Date: 10/26/2021   Time: 10:49 AM    Patient Name: [name] Riley      Medical Record Number: [id number]  Date of Birth: 02-18-1951 Sex: Female         Room/Bed: 5D66Y/4I34V-42 Payor Info: Payor: MEDICARE / Plan: MEDICARE PART A AND B / Product Type: *No Product type* /    Admit Date/Time:  10/20/2021  4:38 PM  Primary Diagnosis:  Breast cancer metastasized to brain [facility])  Hospital Problems: Principal Problem:   Breast cancer metastasized to brain [facility])    Expected Discharge Date: Expected Discharge Date:  (TBD)  Team Members Present: Physician leading conference: Dr. Alger Simons Social Worker Present: Loralee Pacas, [place] Nurse Present: Dorthula Nettles, RN PT Present: Estevan Ryder, PT OT Present: Cherylynn Ridges, OT SLP Present: Weston Anna, SLP PPS Coordinator present : Gunnar Fusi, SLP     Current Status/Progress Goal Weekly Team Focus  Bowel/Bladder   Foley, incontinent bowel LBM 1/9  Achieve bowel continence. Foley expected for long term use  Assess for sgns of constipation, treat PRN   Swallow/Nutrition/ Hydration             ADL's   Max/total A for all BADL's and transfers, sitting balance at EOB-min/mod A, L UE now flaccid, was Brunnstrom stage 3 on eval  Min A- likely lofty  OOB toleratance, sitting balance, transfers, self-care retraining, L UE NMR   Mobility   maxA rolling in bed, totalA supine<>sit, totalA sit to stand, maxA squat pivot transfer to [place] WC level  balance, transfers, L NMR, WC mobility, family ed   Communication             Safety/Cognition/ Behavioral Observations  Mod-Max A  Min A  sustained attention, recall of basic information with use of aids, functional problem solving, emergnet awareness   Pain   percocet 1 tab 1-2 times a day for pain relief. Pain up to 7-8 out of 10. Achieved to 0 at times  Pain remains less than 3 out of 10  Assess pain q shift and PRN.  Offer meds as needed   Skin   skin tear/abrasions to BLE(Xereform,gauze and kerlix), Redness to buttocks  No new skin breakdown  Assess skin q shift and PRN. Sacral foam to buttocks, prevalon boots to heels, on air mattress     Discharge Planning:  D/c to home with husband who will provide care.   Team Discussion: Neurology consulted yesterday. Increased weakness, cognition impaired/declined. EEG-no current seizure activity. Worsening liver function. Left inattention worsening. Will contact Oncology. PA has spoken with spouse twice, doesn't have full understanding of patient's current condition. Spouse doesn't want patient to have pain medication, despite severe abdominal pain. Wants patient up in chair all day, can't tolerate. Therapy changed to 15/7. Foley in place, incontinent bowel, positive UTI, treating. Reports 8/10 abdominal pain. Skin tears, daily dressing changes, improving. Need to address goals of care. Need family education with family. Patient on target to meet rehab goals: no, patient can't tolerate therapy and is declining in function.  *See Care Plan and progress notes for long and short-term goals.   Revisions to Treatment Plan:  Adjusting medications, consulted Neurology, contacting Oncology.  Teaching Needs: Family education, medication/pain management, skin/wound care, bowel/bladder management, transfer training, etc.  Current Barriers to Discharge: Decreased caregiver support, Medical stability, Home enviroment access/layout, Incontinence, Neurogenic bowel and bladder, Wound care, Lack of/limited family support, and Medication compliance  Possible  Resolutions to Barriers: Family education Hospice consult Determine goals of care     Medical Summary Current Status: metastatic breast cancer to brain. left hemiparesis, ? seizure related. also with elevated ammonia level and UTI. she is DNR.  Barriers to Discharge: Behavior;Medical stability   Possible Resolutions to  [employer]: rx seizures, steroids for edema control, rx uti, rx ammonia level   Continued Need for Acute Rehabilitation Level of Care: The patient requires daily medical management by a physician with specialized training in physical medicine and rehabilitation for the following reasons: Direction of a multidisciplinary physical rehabilitation program to maximize functional independence : Yes Medical management of patient stability for increased activity during participation in an intensive rehabilitation regime.: Yes Analysis of laboratory values and/or radiology reports with any subsequent need for medication adjustment and/or medical intervention. : Yes   I attest that I was present, lead the team conference, and concur with the assessment and plan of the team.   Cristi Loron 10/26/2021, 3:40 PM

## 2021-10-26 NOTE — Progress Notes (Signed)
LTM EEG discontinued - no skin breakdown at unhook.   

## 2021-10-26 NOTE — Progress Notes (Signed)
Physical Therapy Session Note  Patient Details  Name: [name] Riley MRN: [id number] Date of Birth: September 27, 1951  Today's Date: 10/26/2021 PT Missed Time: 45 Minutes Missed Time Reason: Patient fatigue  Pt received in bed with her husband, Hassle, and son, Mitzi Hansen, present. Pt no longer on EEG. Pt reports she is doing "better;" however, declines participation in therapy. Pt then states she might participate "after breakfast" showing her disorientation to time as it is currently after lunch - therapist reoriented her with minimal success. Despite encouragement from therapist and her family pt continues to politely decline participation in therapy. Pt left in bed with her family present. Missed 45 minutes of skilled physical therapy.  Tawana Scale , PT, DPT, NCS, CSRS  10/26/2021, 12:16 PM

## 2021-10-26 NOTE — Consult Note (Signed)
Palliative Medicine Inpatient Consult Note  Consulting Provider: Meredith Staggers, MD  Reason for consult:   [facility] Palliative Medicine Consult  Reason for Consult? metastatic breast cancer. End of life discussion, discussion of hospice and available resources   HPI:  Per intake H&P --> [name] Riley is a 71 year old female with history of Left breast cancer 08/2011 with recurrence and mets to lungs, right kidney, R-sternum/scapula/ribs/spine/R-femur/skull as well as right frontal,  parietal and temporal lobe with edema and new onset seizures with breakthrough seizures and significant decline. Has been in CIR for rehabilitation but is not making great progress. The Palliative care team was asked to get involved to further discuss goals of care in the setting of metastatic disease - not progressing in rehabilitation.   Clinical Assessment/Goals of Care:  *Please note that this is a verbal dictation therefore any spelling or grammatical errors are due to the "[place]" system interpretation.  I have reviewed medical records including EPIC notes, labs and imaging, received report from bedside RN, assessed the patient who is lying in bed.    I met with Trilby [address], her spouse,  Vernard Gambles and her son, Mitzi Hansen to further discuss diagnosis prognosis, GOC, EOL wishes, disposition and options.   I introduced Palliative Medicine as specialized medical care for people living with serious illness. It focuses on providing relief from the symptoms and stress of a serious illness. The goal is to improve quality of life for both the patient and the family.  Medical History Review and Understanding:  Manuelita's husband shares that [name] Riley has had breast cancer for quite sometime. He states that over the last month or so her clinical condition has declined. They recently saw the neurologist and asked if her brain tumor was cancer, per Vernard Gambles he understood that it was not though  per review of images with Dr. Naaman Plummer he realizes that the tumor has increased in size and if greatly contributing to left sided hemiplegia. Per Arbie Cookey she was unable to see, or eat which was a new symptom. Reviewed that the tumor causes seizures.   Per Vernard Gambles the decadron caused [name] Riley's extremities to swell.  Social History:  [name] is from the [place], [place] area. She has three children, four grandchildren, and one great grandchild. She has been married to her spouse, Vernard Gambles for > 50 years. 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 She enjoys flower, children, coloring, and shopping. She is a woman of faith and practices within Christianity.  Functional and Nutritional State:  Prior to admission [name] Riley was living at home with her husband. She was limited in mobility though could stand and pivot. She had a nurses aid come in twice weekly to bath her.   Appetite has been variable throughout the years though when on steroids it seems to be fairly good.  Palliative Symptoms:  Generalized pain in the setting of metastatic disease - already on decadron 62m TID and oxycodone PRN. Per patient and family the oxycodone makes her sleepy making rehab difficult.   Advance Directives:  Patient [place] none on file in [facility]  Code Status:  Concepts specific to code status, artifical feeding and hydration, continued IV antibiotics and rehospitalization was had. Patient is a DNAR/DNI.   Goals for the Future:  At this time patients family would like to see what Dr. GLindi Adiehas to say and identify if any additional treatment options are possible. I have shared with  them my concerns in the setting of her worsening frailty that he may very well share she is not a candidate for additional treatments. If that is the case I have introduced the topic of hospice. I described hospice as a service for patients who have a life expectancy of 6 months or less. The goal of  hospice is the preservation of dignity and quality at the end phases of life. Under hospice care, the focus changes from curative to symptom relief. I shared very honestly that it may be worth considering where if we are looking at S[place]'s final journey we think it should be.   Discussed the importance of continued conversation with family and their  medical providers regarding overall plan of care and treatment options, ensuring decisions are within the context of the patients values and GOCs.  Decision Maker: Pama Roskos (spouse) [phone]  SUMMARY OF RECOMMENDATIONS   DNAR/DNI  Plan to meet with Dr. Lindi Adie this evening - pending their conversation a plan will be constructed in terms of if she would be a candidate for any additional therapies  Based upon Narely's decline in the setting of metastatic disease she is appropriate for hospice should she and her family decide on this  Pain Management to continue with percocet 5/370m PO Q4H PRN  Ongoing Palliative care support - I will not be present tomorrow though my colleague, MWadie Lessenwill see CArbie Cookey Code Status/Advance Care Planning: DNAR/DNI  Palliative Prophylaxis:  Aspiration, Bowel Regimen, Delirium Protocol, Frequent Pain Assessment, Oral Care, Palliative Wound Care, and Turn Reposition  Additional Recommendations (Limitations, Scope, Preferences):  Continue current level of care  Psycho-social/Spiritual:  Desire for further Chaplaincy support: Not presently Additional Recommendations: Education on metastatic disease   Prognosis: Very poor in the setting of clinical decline - frailty. < 6 months  Discharge Planning: Unclear at this time.   Vitals:   10/26/21 0522 10/26/21 1303  BP: 139/76 (!) 145/75  Pulse: 84 80  Resp: 16 17  Temp: 98.6 F (37 C) 98.4 F (36.9 C)  SpO2: 97% 96%    Intake/Output Summary (Last 24 hours) at 10/26/2021 1541 Last data filed at 10/26/2021 1309 Gross per 24 hour  Intake 340 ml   Output 1351 ml  Net -1011 ml   Last Weight  Most recent update: 10/20/2021  4:58 PM    Weight  63.2 kg (139 lb 5.3 oz)            Gen:  Very frail elderly F in NAD HEENT: moist mucous membranes CV: Regular rate and rhythm  PULM: On RA ABD: soft/nontender  EXT: No edema  Neuro: Alert and oriented x2 - gets confused in conversation  PPS: 30%   This conversation/these recommendations were discussed with patient primary care team, Dr. SNaaman Plummer Time In: 1515 Time Out:1630 Total Time: 736 MDM High in the setting of metastatic breast cancer and decisions on possible de-escalation of care. ______________________________________________________ [facility] Team Cell Phone: [phone] utilize secure chat with additional questions, if there is no response within 30 minutes please call the above phone number  Palliative Medicine Team providers are available by phone from 7am to 7pm daily and can be reached through the team cell phone.  Should this patient require assistance outside of these hours, please call the patient's attending physician.

## 2021-10-26 NOTE — Progress Notes (Signed)
Physical Therapy Session Note  Patient Details  Name: [name] Riley MRN: [id number] Date of Birth: [birth year]  Today's Date: 10/26/2021 PT Individual Time: 1100-1145 PT Individual Time Calculation (min): 45 min  and Today's Date: 10/26/2021 PT Missed Time: 15 Minutes Missed Time Reason: Patient fatigue  Short Term Goals: Week 1:  PT Short Term Goal 1 (Week 1): Pt will perform supine<>sit consistently with modA. PT Short Term Goal 2 (Week 1): Pt will perform bed to chair transfer consistently with modA. PT Short Term Goal 3 (Week 1): Pt will perform sit to stand consistently with modA. PT Short Term Goal 4 (Week 1): Pt will initiate gait training.  Skilled Therapeutic Interventions/Progress Updates:    Patient received supine in bed, agreeable to PT with encouragement. She reports 7/10 abdominal pain, premedicated. PT providing rest breaks, distractions and repositioning to assist with pain management. Patient currently hooked up to EEG with some leads either off or displaced- RN aware. Neurologist in/out for assessment. Patient coming to sit edge of bed with TotalA and verbal cues for sequencing. Unable to reposition at edge of bed- required TotalA to ensure hips were square. Patient with persistent posterior R lean/LOB with very limited ability to correct herself and despite max multimodal cues. PT attempting to have patient scan R > L to see different object in the room (clock, door, IV pole, etc). Patient unable to scan L even despite PT attempting to gently range c-spine to the L. Patient able to follow cues to recline down to R elbow and reach nose toward bedrail to encourage anterior weight displacement and increased core activation. Patient requiring ModA to push back up to midline. She was able to complete small range modified sit ups, reaching nose anteriorly to target before relaxing back onto tech. Patient stating she was too fatigued to continue with therapy. TotalA x2 to return supine.  Patient positioned slightly on L for pressure relief and L attention. 4 rails up, call light within reach.   Therapy Documentation Precautions:  Precautions Precautions: Fall Precaution Comments: L side weakness Restrictions Weight Bearing Restrictions: No   Therapy/Group: Individual Therapy  Karoline Caldwell, PT, DPT, CBIS  10/26/2021, 7:45 AM

## 2021-10-26 NOTE — Plan of Care (Signed)
Pt's plan of care adjusted to 15/7 after speaking with care team and discussed with MD in team conference as pt currently unable to tolerate current therapy schedule with OT, PT, and SLP.   

## 2021-10-26 NOTE — Progress Notes (Signed)
Subjective: No clinical seizures overnight  ROS: negative except above  Examination  Vital signs in last 24 hours: Temp:  [97.8 F (36.6 C)-98.6 F (37 C)] 98.6 F (37 C) (01/10 0522) Pulse Rate:  [72-84] 84 (01/10 0522) Resp:  [16-17] 16 (01/10 0522) BP: (103-152)/(57-77) 139/76 (01/10 0522) SpO2:  [96 %-98 %] 97 % (01/10 0522)  General: lying in bed, NAD CVS: pulse-normal rate and rhythm RS: breathing comfortably, CTAB Extremities: warm, edema in left arm  Neuro: MS: Alert, oriented, follows commands CN: pupils equal and reactive,  EOMI, face symmetric, tongue midline, normal sensation over face, left visual and sensory neglect Motor: 4/5 strength in RUE,RLE, 3/5 in LUE, 2/5 in LLE Sensory: left hemineglect  Basic Metabolic Panel: Recent Labs  Lab 10/21/21 0606 10/25/21 0521  NA 136 134*  K 3.9 3.8  CL 103 98  CO2 27 28  GLUCOSE 82 80  BUN 20 21  CREATININE 0.60 0.61  CALCIUM 8.2* 8.2*    CBC: Recent Labs  Lab 10/21/21 0606 10/25/21 0521  WBC 6.6 7.8  NEUTROABS 4.1  --   HGB 11.7* 11.5*  HCT 34.5* 33.1*  MCV 104.2* 102.8*  PLT 57* 65*     Coagulation Studies: No results for input(s): LABPROT, INR in the last 72 hours.  Imaging MRI brain without contrast 10/25/2021:  1. In comparison to recent prior from 10/14/2021, similar versus mildly progressed extensive vasogenic edema in the high right frontoparietal region which was discussed on the priors. 2. Similar noncontrast appearance of the areas of susceptibility artifact in the right frontal and parietal lobes, better characterized on prior post-contrast MRI. Postcontrast imaging could better assess for interval change if clinically indicated. 3. Stable focal T2 signal at the right temporal tip. 4. No evidence of acute infarct.  ASSESSMENT AND PLAN: 71 year old female with metastatic brain disease, epilepsy on Keppra and Vimpat now with worsening left-sided weakness in the setting worsening  vasogenic edema  Metastatic brain disease Cerebral edema Epilepsy Right hemiparesis Left hemineglect -LTM EEG showing lateralized periodic discharges with overriding rhythmicity which is on the ictal-interictal continuum with high potential for seizure recurrence  Recommendations -Continue Keppra 2000 mg twice daily and Vimpat 100 mg twice daily -Continue dexamethasone 4mg  Q8h for cerebral edema -We will DC LTM EEG as there were no definite seizures overnight -Continue PT/OT -Continue seizure precautions -Management of rest of comorbidities per primary team  Thank you for allowing [name] to participate in the care of this patient.  Neurology will follow peripherally.  Please call us for any further questions.  I have spent a total of  26  minutes with the patient reviewing hospital notes,  test results, labs and examining the patient as well as establishing an assessment and plan that was discussed personally with the patient.  > 50% of time was spent in direct patient care.   Zeb Comfort Epilepsy [facility] For questions after 5pm please refer to AMION to reach the Neurologist on call

## 2021-10-26 NOTE — Progress Notes (Signed)
Speech Language Pathology Daily Session Note  Patient Details  Name: [name] Riley MRN: [id number] Date of Birth: [birth year]  Today's Date: 10/26/2021 SLP Individual Time: 0720-0800 SLP Individual Time Calculation (min): 40 min  Short Term Goals: Week 1: SLP Short Term Goal 1 (Week 1): Patient will utilize external aids to recall new, daily information with Mod A multimodal cues. SLP Short Term Goal 2 (Week 1): Patient will demonstrate functional problem solving for basic and familiar tasks with Mod A verbal cues. SLP Short Term Goal 3 (Week 1): Patient will attend to left field of enviornment during functional tasks with Mod A multimodal cues. SLP Short Term Goal 4 (Week 1): Patient will demonstrate sustianed attention to functional tasks for 30 minutes with Min verbal cues for redirection. SLP Short Term Goal 5 (Week 1): Patient will self-monitor and correct errors during functional tasks with Mod A verbal cues.  Skilled Therapeutic Interventions: Skilled treatment session focused on cognitive goals. Upon arrival, patient was asleep in but but easily awakened. SLP facilitated session by providing extra time and Mod-Max A verbal and visual cues for attention to left field of environment to locate items on her breakfast tray. Mod verbal cues were also needed for functional problem solving during self-feeding with Max verbal cues needed for sustained attention to task as patient would become verbose and tangential. Throughout session, patient had multiple EEG leads in place on her scalp with minimal awareness for reasoning requiring total A.  Patient left upright in bed with RN present. Continue with current plan of care.      Pain Pain near breast bone. RN aware   Therapy/Group: Individual Therapy  Billyjoe Go 10/26/2021, 11:00 AM

## 2021-10-26 NOTE — Progress Notes (Addendum)
PROGRESS NOTE   Subjective/Complaints:  Pt feels tired. Was able to sleep. Says only pain she's having is in her abdomen.   ROS: Limited due to cognitive/behavioral    Objective:   MR BRAIN WO CONTRAST  Result Date: 10/25/2021 CLINICAL DATA:  Neuro deficit, acute, stroke suspected EXAM: MRI HEAD WITHOUT CONTRAST TECHNIQUE: Multiplanar, multiecho pulse sequences of the brain and surrounding structures were obtained without intravenous contrast. COMPARISON:  10/14/2021. FINDINGS: Brain: In comparison to recent prior from 10/14/2021, similar versus mildly progressed extensive vasogenic edema in the high right frontal lobe and portions of the parietal lobe. Mass effect is similar with sulcal effacement and mild effacement of the right lateral ventricle. No significant midline shift. Cortical areas of susceptibility artifact are again noted along the right frontal and parietal convexity, grossly similar. Similar associated artifactual mild DWI hyperintensity (T2 shine through). No evidence of acute infarct. Similar focus of T2/FLAIR hyperintensity at the right temporal tip. No hydrocephalus. No acute hemorrhage. Vascular: Major arterial flow voids are maintained skull base. Skull and upper cervical spine: Redemonstrated extensive osseous metastatic disease. Sinuses/Orbits: Clear sinuses.  Unremarkable orbits. Other: No mastoid effusions IMPRESSION: 1. In comparison to recent prior from 10/14/2021, similar versus mildly progressed extensive vasogenic edema in the high right frontoparietal region which was discussed on the priors. 2. Similar noncontrast appearance of the areas of susceptibility artifact in the right frontal and parietal lobes, better characterized on prior post-contrast MRI. Postcontrast imaging could better assess for interval change if clinically indicated. 3. Stable focal T2 signal at the right temporal tip. 4. No evidence of acute  infarct. Electronically Signed   By: Margaretha Sheffield M.D.   On: 10/25/2021 15:57   Recent Labs    10/25/21 0521  WBC 7.8  HGB 11.5*  HCT 33.1*  PLT 65*    Recent Labs    10/25/21 0521  NA 134*  K 3.8  CL 98  CO2 28  GLUCOSE 80  BUN 21  CREATININE 0.61  CALCIUM 8.2*     Intake/Output Summary (Last 24 hours) at 10/26/2021 0902 Last data filed at 10/26/2021 0092 Gross per 24 hour  Intake 220 ml  Output 1351 ml  Net -1131 ml        Physical Exam: Vital Signs Blood pressure 139/76, pulse 84, temperature 98.6 F (37 C), temperature source Oral, resp. rate 16, height 5\' 8"  (1.727 m), weight 63.2 kg, SpO2 97 %.     Constitutional: No distress . Vital signs reviewed. HEENT: NCAT, EOMI, oral membranes moist Neck: supple Cardiovascular: RRR without murmur. No JVD    Respiratory/Chest: CTA Bilaterally without wheezes or rales. Normal effort    GI/Abdomen: BS +, non-tender, non-distended Ext: no clubbing, cyanosis, or edema Psych: flat but cooperative.  Skin: ecchymoses and skin tears/wounds bilateral shins, stasis changes are stable to improved.  A few other scattered bruises--stable Neuro:  slowed. Right gaze preference but can attend to left.  Follows commands, stm deficits, limited insight.    No focal CN findings. RUE 4/5. LUE 0-tr/5 prox to distal. RLE 4-5/5. LLE 1/5. Toes up. No resting tone. DTR's 1+ Musculoskeletal: left hip less tender  Assessment/Plan: 1. Functional deficits  which require 3+ hours per day of interdisciplinary therapy in a comprehensive inpatient rehab setting. Physiatrist is providing close team supervision and 24 hour management of active medical problems listed below. Physiatrist and rehab team continue to assess barriers to discharge/monitor patient progress toward functional and medical goals  Care Tool:  Bathing  Bathing activity did not occur: Safety/medical concerns Body parts bathed by patient: Left arm, Chest, Abdomen, Front  perineal area, Right upper leg, Face   Body parts bathed by helper: Right arm, Buttocks, Left lower leg, Right lower leg, Left upper leg     Bathing assist Assist Level: Total Assistance - Patient < 25%     Upper Body Dressing/Undressing Upper body dressing   What is the patient wearing?: Pull over shirt    Upper body assist Assist Level: Total Assistance - Patient < 25%    Lower Body Dressing/Undressing Lower body dressing      What is the patient wearing?: Incontinence brief, Pants     Lower body assist Assist for lower body dressing: 2 Helpers     Toileting Toileting Toileting Activity did not occur (Clothing management and hygiene only): N/A (no void or bm)  Toileting assist Assist for toileting: Dependent - Patient 0% (requires in and out cath)     Transfers Chair/bed transfer  Transfers assist     Chair/bed transfer assist level: Maximal Assistance - Patient 25 - 49%     Locomotion Ambulation   Ambulation assist   Ambulation activity did not occur: Safety/medical concerns          Walk 10 feet activity   Assist  Walk 10 feet activity did not occur: Safety/medical concerns        Walk 50 feet activity   Assist Walk 50 feet with 2 turns activity did not occur: Safety/medical concerns         Walk 150 feet activity   Assist Walk 150 feet activity did not occur: Safety/medical concerns         Walk 10 feet on uneven surface  activity   Assist Walk 10 feet on uneven surfaces activity did not occur: Safety/medical concerns         Wheelchair     Assist Is the patient using a wheelchair?: Yes Type of Wheelchair: Manual    Wheelchair assist level: Dependent - Patient 0% Max wheelchair distance: 150'    Wheelchair 50 feet with 2 turns activity    Assist        Assist Level: Dependent - Patient 0%   Wheelchair 150 feet activity     Assist      Assist Level: Dependent - Patient 0%   Blood pressure  139/76, pulse 84, temperature 98.6 F (37 C), temperature source Oral, resp. rate 16, height 5\' 8"  (1.727 m), weight 63.2 kg, SpO2 97 %.  Medical Problem List and Plan: 1. Functional deficits secondary to metastatic breast cancer. Pt with increased left hemiparesis d/t advancing metastatic disease vs post-ictal state             -patient may shower             -ELOS/Goals: 14-21 days            -Continue CIR therapies including PT, OT, and SLP. Interdisciplinary team conference today to discuss goals, barriers to discharge, and dc planning.  ?palliative care/hospice involvement  -seizure rx as below  -decadron increased to 4mg  tid  -no infarct on MRI yesterday but sl increase in edema  right FP area  -will discuss plan with oncology today. Spent time discussing situation with husband and son. 2.  Antithrombotics: -DVT/anticoagulation:  Mechanical: Sequential compression devices, below knee Bilateral lower extremities             -antiplatelet therapy: N/A 3. Pain Management: Oxycodone prn.  4. Mood: LCSW to follow for evaluation and support.              -antipsychotic agents: N/a 5. Neuropsych: This patient is capable of making decisions on her own behalf. 6. Skin/Wound Care: routine pressure relief measures.              --continue air mattress overlay to prevent breakdown. Prevalon boots for sore heels.              -continue xeroform, 4x4, kerlix to areas for protection. Appreciate WOC RN note     1/6 above areas clean, healing  1/10 continue current care 7. Fluids/Electrolytes/Nutrition: Monitor I/O. Check lytes in am.  8. Focal status epilepticus:  Continue Keppra 2000 mg BID and Vimpat 50 mg BID.   -1/10 appreciate Neuro f/u.  -vimpat loading dose iv and po increased to 100mg  bid yesterday   -overnight EEG--await reading   -continue supportive care 9. Hepatic cirrhosis:                1/9 ammonia up to 78, increased lactulose to 30mg  tid  1/10-recheck level Wednesday    -LFT's  also slowly trending up   -consider conference with onc/GI 10. Breast cancer with extensive bony mets:discussed benefits of prehab for response to chemotherapy.   -multiple mets on imaging of pelvis 11. Cerebral mets: Continue decadron 4 mg daily.  12. Thrombocytopenia: Plts 92-->59-->51.    --1/9 plts up to 65k 13. HTN: monitor BP TID--continue lasix and metoprolol.               --metoprolol decreased 01/04 due to hypotension.   1/9 continue regimen 14. HLD: LDL 129 on 04/24/19: statin not recommended given elevated FLTs.  15. Urine retention:  1/6-u/a equivocal, check ucx   -oob to empty   -begin urecholine trial  1/10 100k e coli,   sensitive to sulfa--began bactrim 1/9 -cont rx for 7days 16. Constipation  1/9 miralax --changed back to prn with increase of lactulose  -lactulose as above      At least 35 total minutes were spent in examination of patient, assessment of pertinent data,  formulation of a treatment plan, and in discussion with patient and/or family.     LOS: 6 days A FACE TO FACE EVALUATION WAS PERFORMED  Meredith Staggers 10/26/2021, 9:02 AM

## 2021-10-27 DIAGNOSIS — Z66 Do not resuscitate: Secondary | ICD-10-CM

## 2021-10-27 LAB — COMPREHENSIVE METABOLIC PANEL
ALT: 137 U/L — ABNORMAL HIGH (ref 0–44)
AST: 189 U/L — ABNORMAL HIGH (ref 15–41)
Albumin: 2.6 g/dL — ABNORMAL LOW (ref 3.5–5.0)
Alkaline Phosphatase: 559 U/L — ABNORMAL HIGH (ref 38–126)
Anion gap: 8 (ref 5–15)
BUN: 20 mg/dL (ref 8–23)
CO2: 25 mmol/L (ref 22–32)
Calcium: 8.5 mg/dL — ABNORMAL LOW (ref 8.9–10.3)
Chloride: 99 mmol/L (ref 98–111)
Creatinine, Ser: 0.64 mg/dL (ref 0.44–1.00)
GFR, Estimated: >60 mL/min (ref >60)
Glucose, Bld: 111 mg/dL — ABNORMAL HIGH (ref 70–99)
Potassium: 4.3 mmol/L (ref 3.5–5.1)
Sodium: 132 mmol/L — ABNORMAL LOW (ref 135–145)
Total Bilirubin: 1.2 mg/dL (ref 0.3–1.2)
Total Protein: 5.7 g/dL — ABNORMAL LOW (ref 6.5–8.1)

## 2021-10-27 LAB — AMMONIA: Ammonia: 86 umol/L — ABNORMAL HIGH (ref 9–35)

## 2021-10-27 NOTE — Progress Notes (Signed)
PROGRESS NOTE   Subjective/Complaints:  Pt awake looking at her coloring book when I came in. Says she's comfortable and slept last night  ROS: Limited due to cognitive/behavioral    Objective:   MR BRAIN WO CONTRAST  Result Date: 10/25/2021 CLINICAL DATA:  Neuro deficit, acute, stroke suspected EXAM: MRI HEAD WITHOUT CONTRAST TECHNIQUE: Multiplanar, multiecho pulse sequences of the brain and surrounding structures were obtained without intravenous contrast. COMPARISON:  10/14/2021. FINDINGS: Brain: In comparison to recent prior from 10/14/2021, similar versus mildly progressed extensive vasogenic edema in the high right frontal lobe and portions of the parietal lobe. Mass effect is similar with sulcal effacement and mild effacement of the right lateral ventricle. No significant midline shift. Cortical areas of susceptibility artifact are again noted along the right frontal and parietal convexity, grossly similar. Similar associated artifactual mild DWI hyperintensity (T2 shine through). No evidence of acute infarct. Similar focus of T2/FLAIR hyperintensity at the right temporal tip. No hydrocephalus. No acute hemorrhage. Vascular: Major arterial flow voids are maintained skull base. Skull and upper cervical spine: Redemonstrated extensive osseous metastatic disease. Sinuses/Orbits: Clear sinuses.  Unremarkable orbits. Other: No mastoid effusions IMPRESSION: 1. In comparison to recent prior from 10/14/2021, similar versus mildly progressed extensive vasogenic edema in the high right frontoparietal region which was discussed on the priors. 2. Similar noncontrast appearance of the areas of susceptibility artifact in the right frontal and parietal lobes, better characterized on prior post-contrast MRI. Postcontrast imaging could better assess for interval change if clinically indicated. 3. Stable focal T2 signal at the right temporal tip. 4. No  evidence of acute infarct. Electronically Signed   By: Margaretha Sheffield M.D.   On: 10/25/2021 15:57   Overnight EEG with video  Result Date: 10/26/2021 Lora Havens, MD     10/26/2021 12:34 PM Patient Name: RUCHEL BRANDENBURGER MRN: [id number] Epilepsy Attending: Lora Havens Referring Physician/Provider: Dr Kerney Elbe Duration: 10/25/2020 1806 to 10/26/2020 1211  Patient history: 71 year old female with metastatic brain disease presented with seizure. EEG to evaluate for seizure.  Level of alertness: Awake, asleep  AEDs during EEG study: LEV  Technical aspects: This EEG study was done with scalp electrodes positioned according to the 10-20 International system of electrode placement. Electrical activity was acquired at a sampling rate of 500Hz  and reviewed with a high frequency filter of 70Hz  and a low frequency filter of 1Hz . EEG data were recorded continuously and digitally stored.  Description: The posterior dominant rhythm consists of 9 Hz activity of moderate voltage (25-35 uV) seen predominantly in posterior head regions, symmetric and reactive to eye opening and eye closing.  Sleep was characterized by vertex waves, sleep spindles (12 to 14 Hz), maximum frontocentral region.  EEG showed continuous 5 to 6 Hz theta slowing in right hemisphere, maximal right temporo-parietal region. Spikes were also noted in right temporo-parietal region, which at times appear periodic at 1hz  and rhythmic without definite evolution. Hyperventilation and photic stimulation were not performed.     ABNORMALITY - Lateralized periodic discharges, right hemisphere, right temporo-parietal region (LPD+R) - Continuous slow, right hemisphere, maximal right temporo-parietal region  IMPRESSION: This study showed epileptogenicity and cortical dysfunction arising from  right hemisphere, right temporo-parietal region likely secondary to underlying mass. The lateralized periodic discharges were at times rhythmic which is on the  ictal-interictal region with high potential for seizure recurrence. No definite seizures were seen during this study.  Lora Havens   Recent Labs    10/25/21 0521  WBC 7.8  HGB 11.5*  HCT 33.1*  PLT 65*    Recent Labs    10/25/21 0521 10/27/21 0409  NA 134* 132*  K 3.8 4.3  CL 98 99  CO2 28 25  GLUCOSE 80 111*  BUN 21 20  CREATININE 0.61 0.64  CALCIUM 8.2* 8.5*     Intake/Output Summary (Last 24 hours) at 10/27/2021 0837 Last data filed at 10/27/2021 0813 Gross per 24 hour  Intake 750 ml  Output 1550 ml  Net -800 ml        Physical Exam: Vital Signs Blood pressure (!) 157/87, pulse 75, temperature 98 F (36.7 C), resp. rate 16, height 5\' 8"  (1.727 m), weight 63.2 kg, SpO2 99 %.     Constitutional: No distress . Vital signs reviewed. HEENT: NCAT, EOMI, oral membranes moist Neck: supple Cardiovascular: RRR without murmur. No JVD    Respiratory/Chest: CTA Bilaterally without wheezes or rales. Normal effort    GI/Abdomen: BS +, non-tender, non-distended Ext: no clubbing, cyanosis, or edema Psych: pleasant and cooperative   Skin: ecchymoses and skin tears/wounds bilateral shins, stasis changes are stable to improved.  A few other scattered bruises--stable Neuro:  more alert. Significant right gaze preference but can attend to left.  Follows commands, stm deficits, limited insight.    No focal CN findings. RUE 4/5. LUE 0-tr/5 prox to distal. RLE 4-5/5. LLE still 1/5. Toes up. No resting tone. DTR's 1+ Musculoskeletal: left hip with mild tenderness in ROM  Assessment/Plan: 1. Functional deficits which require 3+ hours per day of interdisciplinary therapy in a comprehensive inpatient rehab setting. Physiatrist is providing close team supervision and 24 hour management of active medical problems listed below. Physiatrist and rehab team continue to assess barriers to discharge/monitor patient progress toward functional and medical goals  Care Tool:  Bathing   Bathing activity did not occur: Safety/medical concerns Body parts bathed by patient: Left arm, Chest, Abdomen, Front perineal area, Right upper leg, Face   Body parts bathed by helper: Right arm, Buttocks, Left lower leg, Right lower leg, Left upper leg     Bathing assist Assist Level: Total Assistance - Patient < 25%     Upper Body Dressing/Undressing Upper body dressing   What is the patient wearing?: Pull over shirt    Upper body assist Assist Level: Total Assistance - Patient < 25%    Lower Body Dressing/Undressing Lower body dressing      What is the patient wearing?: Incontinence brief, Pants     Lower body assist Assist for lower body dressing: 2 Helpers     Toileting Toileting Toileting Activity did not occur (Clothing management and hygiene only): N/A (no void or bm)  Toileting assist Assist for toileting: Dependent - Patient 0% (requires in and out cath)     Transfers Chair/bed transfer  Transfers assist     Chair/bed transfer assist level: Maximal Assistance - Patient 25 - 49%     Locomotion Ambulation   Ambulation assist   Ambulation activity did not occur: Safety/medical concerns          Walk 10 feet activity   Assist  Walk 10 feet activity did not occur: Safety/medical concerns  Walk 50 feet activity   Assist Walk 50 feet with 2 turns activity did not occur: Safety/medical concerns         Walk 150 feet activity   Assist Walk 150 feet activity did not occur: Safety/medical concerns         Walk 10 feet on uneven surface  activity   Assist Walk 10 feet on uneven surfaces activity did not occur: Safety/medical concerns         Wheelchair     Assist Is the patient using a wheelchair?: Yes Type of Wheelchair: Manual    Wheelchair assist level: Dependent - Patient 0% Max wheelchair distance: 150'    Wheelchair 50 feet with 2 turns activity    Assist        Assist Level: Dependent - Patient  0%   Wheelchair 150 feet activity     Assist      Assist Level: Dependent - Patient 0%   Blood pressure (!) 157/87, pulse 75, temperature 98 F (36.7 C), resp. rate 16, height 5\' 8"  (1.727 m), weight 63.2 kg, SpO2 99 %.  Medical Problem List and Plan: 1. Functional deficits secondary to metastatic breast cancer. Pt with increased left hemiparesis d/t advancing metastatic disease vs post-ictal state             -patient may shower             -ELOS/Goals: -->inpt hospice            -therapy reduced to 15/7  1/11- have discussed case with Dr. Lindi Adie yesterday as well as with palliative care. Pt/family have opted for inpt hospice given the circumstances. Palliative care to speak with family today at 63.   -pt is comfortable at present 2.  Antithrombotics: -DVT/anticoagulation:  Mechanical: Sequential compression devices, below knee Bilateral lower extremities             -antiplatelet therapy: N/A 3. Pain Management: Oxycodone prn.  4. Mood: LCSW to follow for evaluation and support.              -antipsychotic agents: N/a 5. Neuropsych: This patient is capable of making decisions on her own behalf. 6. Skin/Wound Care: routine pressure relief measures.              --continue air mattress overlay to prevent breakdown. Prevalon boots for sore heels.              -continue xeroform, 4x4, kerlix to areas for protection. Appreciate WOC RN note     1/6 above areas clean, healing  1/11 continue current care 7. Fluids/Electrolytes/Nutrition: Monitor I/O. Check lytes in am.  8. Focal status epilepticus:  Continue Keppra 2000 mg BID and Vimpat 50 mg BID.   -1/11 appreciate Neuro f/u.  -vimpat dosing adjusted 9. Hepatic cirrhosis:                1/11 ammonia and LFT's continue climb despite increased lactulose. Will not pursue further labs or treatment given hospice plan 10. Breast cancer with extensive bony mets:discussed benefits of prehab for response to chemotherapy.   -multiple  mets on imaging of pelvis 11. Cerebral mets: Continue decadron 4 mg daily.  12. Thrombocytopenia: Plts 92-->59-->51.    --1/9 plts up to 65k 13. HTN: monitor BP TID--continue lasix and metoprolol.               --metoprolol decreased 01/04 due to hypotension.   1/9 continue regimen 14. HLD: LDL 129 on 04/24/19: statin  not recommended given elevated FLTs.  15. Urine retention:  1/6-u/a equivocal, check ucx   -oob to empty   -begin urecholine trial  1/10 100k e coli,   sensitive to sulfa--began bactrim 1/9 -cont rx for 7days 16. Constipation  1/9 miralax --changed back to prn with increase of lactulose  -lactulose as above      At least 35 total minutes were spent in examination of patient, assessment of pertinent data,  formulation of a treatment plan, and in discussion with patient and/or family.     LOS: 7 days A FACE TO FACE EVALUATION WAS PERFORMED  Meredith Staggers 10/27/2021, 8:37 AM

## 2021-10-27 NOTE — Progress Notes (Signed)
Patient ID: [name] Riley, female   DOB: [birth year], 71 y.o.   MRN: [id number]  10/26/2020- SW present with attending to discuss current medical condition. Family aware there will be follow-up from oncology to discuss changes. SW spoke with the family about palliative care vs hospice, and goals of care discussion that will occur. Family to meet with Dr. Lindi Adie at [facility].  10/27/2020- SW received updates that family has decided to discharge to home with hospice and would like hospital bed with air mattress. Preferred hospice is [facility] [phone]).  SW spoke with Cassandra/Intake to discuss referral. Reports that she will review and follow-up if able to accept referral. States they also do not have an air mattress, but have alternating pressure pad (contracted with [employer]). States if accepted, will follow-up with the family. Also indicated there was not a "meet in person" option. SW informed will meet with the family to give updates.   *SW received follow-up from [place] with [facility] [phone] ext [phone]) reporting pt referral accepted, and pt should be enrolled tomorrow at time of visit due to condition. Confirms she made contact with patient's husband. States we cannot discharge pt until DME received per husband request. Reports that she will be ordering DME- hoyer lift (if available), hospital bed with alternating pressure pad, and over bed table; delivery window 10am-1pm. Continued communication to confirm DME delivered. SW will arrange transport for 1:30pm. SW spoke with pt husband Hassle reviewing above. He is aware pt will not leave until DME received. He intends to be here tomorrow morning to review d/c instructions.  SW scheduled 1:30pm pick up with [facility] for 10/28/2021.  Loralee Pacas, MSW, [place] Office: [phone] Cell: [phone] Fax: [phone]

## 2021-10-27 NOTE — Plan of Care (Signed)
Problem: RH Balance Goal: LTG: Patient will maintain dynamic sitting balance (OT) Description: LTG:  Patient will maintain dynamic sitting balance with assistance during activities of daily living (OT) Outcome: Not Met (add Reason) Goal: LTG Patient will maintain dynamic standing with ADLs (OT) Description: LTG:  Patient will maintain dynamic standing balance with assist during activities of daily living (OT)  Outcome: Not Met (add Reason)   Problem: Sit to Stand Goal: LTG:  Patient will perform sit to stand in prep for activites of daily living with assistance level (OT) Description: LTG:  Patient will perform sit to stand in prep for activites of daily living with assistance level (OT) Outcome: Not Met (add Reason)   Problem: RH Eating Goal: LTG Patient will perform eating w/assist, cues/equip (OT) Description: LTG: Patient will perform eating with assist, with/without cues using equipment (OT) Outcome: Not Met (add Reason)   Problem: RH Grooming Goal: LTG Patient will perform grooming w/assist,cues/equip (OT) Description: LTG: Patient will perform grooming with assist, with/without cues using equipment (OT) Outcome: Not Met (add Reason)   Problem: RH Bathing Goal: LTG Patient will bathe all body parts with assist levels (OT) Description: LTG: Patient will bathe all body parts with assist levels (OT) Outcome: Not Met (add Reason) Note: Pt did not complete rehab program due to worsening metastatic disease. Patient discharging home with hospice care-ESD 10/27/21   Problem: RH Dressing Goal: LTG Patient will perform upper body dressing (OT) Description: LTG Patient will perform upper body dressing with assist, with/without cues (OT). Outcome: Not Met (add Reason) Note: Pt did not complete rehab program due to worsening metastatic disease. Patient discharging home with hospice care-ESD 10/27/21 Goal: LTG Patient will perform lower body dressing w/assist (OT) Description: LTG: Patient  will perform lower body dressing with assist, with/without cues in positioning using equipment (OT) Outcome: Not Met (add Reason) Note: Pt did not complete rehab program due to worsening metastatic disease. Patient discharging home with hospice care-ESD 10/27/21   Problem: RH Toileting Goal: LTG Patient will perform toileting task (3/3 steps) with assistance level (OT) Description: LTG: Patient will perform toileting task (3/3 steps) with assistance level (OT)  Outcome: Not Met (add Reason) Note: Pt did not complete rehab program due to worsening metastatic disease. Patient discharging home with hospice care-ESD 10/27/21   Problem: RH Functional Use of Upper Extremity Goal: LTG Patient will use RT/LT upper extremity as a (OT) Description: LTG: Patient will use right/left upper extremity as a stabilizer/gross assist/diminished/nondominant/dominant level with assist, with/without cues during functional activity (OT) Outcome: Not Met (add Reason) Note: Pt did not complete rehab program due to worsening metastatic disease. Patient discharging home with hospice care-ESD 10/27/21   Problem: RH Toilet Transfers Goal: LTG Patient will perform toilet transfers w/assist (OT) Description: LTG: Patient will perform toilet transfers with assist, with/without cues using equipment (OT) Outcome: Not Met (add Reason) Note: Pt did not complete rehab program due to worsening metastatic disease. Patient discharging home with hospice care-ESD 10/27/21   Problem: RH Attention Goal: LTG Patient will demonstrate this level of attention during functional activites (OT) Description: LTG:  Patient will demonstrate this level of attention during functional activites  (OT) Outcome: Not Met (add Reason) Note: Pt did not complete rehab program due to worsening metastatic disease. Patient discharging home with hospice care-ESD 10/27/21   Problem: RH Awareness Goal: LTG: Patient will demonstrate awareness during functional  activites type of (OT) Description: LTG: Patient will demonstrate awareness during functional activites type of (OT) Outcome: Not  Met (add Reason) Note: Pt did not complete rehab program due to worsening metastatic disease. Patient discharging home with hospice care-ESD 10/27/21

## 2021-10-27 NOTE — Progress Notes (Signed)
Hematology oncology  I met the patient and her family in the room.  Her husband and children were present. Patient has profound left-sided paralysis but she is awake alert and oriented and recognized and conversed fairly normally. We discussed her past course of treatment and how over the last several months her health has deteriorated.  Her performance status has significantly declined and the brain metastases appear to be progressively getting worse. Given all of these factors, I strongly supported moving onto hospice care and not pursuing any further aggressive treatments.  They would like to move into an inpatient hospice facility.  I believe that her life expectancy would be very short (probably less than a month)  I provided emotional support and condolences and prayers to the family in these difficult times.  Dr. Tessa Lerner will get in touch with hospice care to provide them with options.

## 2021-10-27 NOTE — Progress Notes (Signed)
Speech Language Pathology Discharge Summary  Patient Details  Name: [name] Riley MRN: [id number] Date of Birth: [birth year]  Today's Date: 10/27/2021 SLP Individual Time: 0700-0740 SLP Individual Time Calculation (min): 40 min   Skilled Therapeutic Interventions:  Skilled treatment session focused on cognitive goals. SLP facilitated session by providing overall Mod A verbal cues for sustained attention to self-feeding and for left visual scanning to locate items on her breakfast tray. Patient appeared to demonstrate increased confusion today with increased tangential speech that required Min verbal cues for redirection. Patient left upright in bed with coloring book per her request. Continue with current plan of care.    Patient has met 0 of 4 long term goals.  Patient to discharge at [facility] Max;Mod level.   Reasons goals not met: Patient has had a change in medical status and decline in function.   Clinical Impression/Discharge Summary:  Patient has not met any LTGs at this time. Patient has had a change in medical status and decline in overall function. Patient and family have decided to discharge home with hospice care. Follow-up is not warranted at this time.   Recommendation:  No f/u recommended as patient will discharge home with hospice care      Equipment: N/A   Reasons for discharge: Discharged from hospital   Patient/Family Agrees with Progress Made and Goals Achieved: Yes    [place], [place] 10/27/2021, 2:35 PM

## 2021-10-27 NOTE — Progress Notes (Signed)
Occupational Therapy Session Note  Patient Details  Name: [name] Riley MRN: [id number] Date of Birth: 04-Jun-1951  Today's Date: 10/27/2021 OT Individual Time: 0830-0900 OT Individual Time Calculation (min): 30 min    Short Term Goals: Week 1:  OT Short Term Goal 1 (Week 1): Patient will tolerate sitting EOB for 10 minutes in preparation for BADL taks OT Short Term Goal 2 (Week 1): Pt will use L UE as a stabilizer with moderate assistance within BADL task OT Short Term Goal 3 (Week 1): Patient will complete toilet transfer with max A of 2 and LRAD  Skilled Therapeutic Interventions/Progress Updates:    Pt received in bed awake and looking at her coloring books. Pt agreeable to a sponge bath in bed and getting dressed.  During self care, cued pt how to support her arm with rolling side to side and pt requires max A to roll with max cues.  Pt able to wash part of her UB but needs max to don shirt and total A with LB.   Family came in at end of session.  Discussed with them that therapy can show them the safest ways to mobilize her in bed and how to A with slide board transfers.  They will be here for the afternoon session with PT.   Pt resting in bed with all needs met. Alarm set.   Therapy Documentation Precautions:  Precautions Precautions: Fall Precaution Comments: L side weakness Restrictions Weight Bearing Restrictions: No    Pain: Pain Assessment Pain Scale: 0-10 Pain Score: 8  Pain Type: Acute pain Pain Location: Abdomen Pain Descriptors / Indicators: Aching Pain Frequency: Constant Pain Onset: On-going Patients Stated Pain Goal: 4 Pain Intervention(s): Medication (See eMAR) ADL: ADL Eating: Minimal assistance Grooming: Minimal assistance Upper Body Bathing: Moderate assistance Where Assessed-Upper Body Bathing: Bed level Lower Body Bathing: Dependent Upper Body Dressing: Maximal assistance Where Assessed-Upper Body Dressing: Bed level Lower Body Dressing:  Dependent Toileting: Dependent Toilet Transfer: Unable to assess (safety/medical) Tub/Shower Transfer: Unable to assess    Therapy/Group: Individual Therapy  [place] 10/27/2021, 9:37 AM

## 2021-10-27 NOTE — Plan of Care (Signed)
Discharging to Inpatient Hospice Care 10/28/2021.  End of Life Care.

## 2021-10-27 NOTE — Progress Notes (Signed)
Occupational Therapy Discharge Summary  Patient Details  Name: [name] Riley MRN: [id number] Date of Birth: 12/24/1950  Patient has met 0 of 13 long term goals. Pt did not complete rehab program due to worsening metastatic disease. Patient discharging home with hospice care with goals of care changed from rehabilitation focused to comfort end of life care. No further OT needs.  Recommendation:  N/a  Equipment: Hospital bed, air mattress (if available), hoyer lift  Reasons for discharge: change in medical status and discharge from hospital  Patient/family agrees with progress made and goals achieved: Yes  OT Discharge Precautions/Restrictions  Precautions Precautions: Fall Precaution Comments: L hemiplegia Restrictions Weight Bearing Restrictions: No Pain Pain Assessment Pain Scale: 0-10 Pain Score: 0-No pain Faces Pain Scale: No hurt ADL ADL Eating: Minimal assistance Grooming: Minimal assistance Upper Body Bathing: Moderate assistance Where Assessed-Upper Body Bathing: Bed level Lower Body Bathing: Dependent Upper Body Dressing: Maximal assistance Where Assessed-Upper Body Dressing: Bed level Lower Body Dressing: Dependent Toileting: Dependent Toilet Transfer: Unable to assess (safety/medical) Tub/Shower Transfer: Unable to assess Vision   Perception    Praxis   Cognition Overall Cognitive Status: Impaired/Different from baseline Arousal/Alertness: Lethargic Orientation Level: Oriented to person;Oriented to situation;Oriented to place Year: 2021 Month: January Day of Week: Incorrect Attention: Sustained Sustained Attention: Impaired Sustained Attention Impairment: Verbal basic;Functional basic Memory: Impaired Memory Impairment: Storage deficit;Decreased recall of new information;Decreased short term memory Immediate Memory Recall: Sock;Blue;Bed Memory Recall Sock: Without Cue Memory Recall Blue: Without Cue Memory Recall Bed: With Cue Awareness:  Impaired Awareness Impairment: Intellectual impairment Safety/Judgment: Impaired Sensation Coordination Gross Motor Movements are Fluid and Coordinated: No Fine Motor Movements are Fluid and Coordinated: No Coordination and Movement Description: dense L hemiplegia Motor  Motor Motor: Hemiplegia Motor - Discharge Observations: L hemi Mobility  Bed Mobility Supine to Sit: Total Assistance - Patient < 25% Sit to Supine: Total Assistance - Patient < 25%  Balance Balance Balance Assessed: Yes Static Sitting Balance Static Sitting - Level of Assistance: 3: Mod assist Dynamic Sitting Balance Dynamic Sitting - Level of Assistance: 3: Mod assist;2: Max assist Static Standing Balance Static Standing - Level of Assistance: 1: +1 Total assist Extremity/Trunk Assessment RUE Assessment RUE Assessment: Within Functional Limits LUE Assessment LUE Assessment: Exceptions to [facility] General Strength Comments: Worsening metestatic disease, now with dense hemiplegia LUE Body System: Neuro Brunstrum levels for arm and hand: Arm;Hand Brunstrum level for arm: Stage I Presynergy Brunstrum level for hand: Stage I Flaccidity   [name] Riley [name] Riley 10/27/2021, 11:36 PM

## 2021-10-27 NOTE — Progress Notes (Signed)
Physical Therapy Session Note  Patient Details  Name: [name] Riley MRN: [id number] Date of Birth: 1951/02/04  Today's Date: 10/27/2021   Short Term Goals: Week 1:  PT Short Term Goal 1 (Week 1): Pt will perform supine<>sit consistently with modA. PT Short Term Goal 2 (Week 1): Pt will perform bed to chair transfer consistently with modA. PT Short Term Goal 3 (Week 1): Pt will perform sit to stand consistently with modA. PT Short Term Goal 4 (Week 1): Pt will initiate gait training.  Skilled Therapeutic Interventions/Progress Updates:   Per Chart and CSW, pt will be discharging home with hospice care on 1/12. Attempted to see pt, but pt asleep and unable to be aroused at this time. Family not present for education. Will continue to follow as needed to support family with transition into hospice care.      Therapy Documentation Precautions:  Precautions Precautions: Fall Precaution Comments: L hemiplegia Restrictions Weight Bearing Restrictions: No General: PT Amount of Missed Time (min): 60 Minutes Vital Signs: Therapy Vitals Temp: 98.4 F (36.9 C) Pulse Rate: 70 Resp: 14 BP: 127/65 Patient Position (if appropriate): Lying Oxygen Therapy SpO2: 98 % O2 Device: Room Air Mobility: Bed Mobility Supine to Sit: Total Assistance - Patient < 25% Sit to Supine: Total Assistance - Patient < 25%   Therapy/Group: Individual Therapy  [name] 10/27/2021, 11:37 PM

## 2021-10-27 NOTE — Progress Notes (Signed)
Patient ID: [name], female   DOB: April 03, 1951, 71 y.o.   MRN: [id number]    Progress Note from the Palliative Medicine Team at [facility]   Patient Name: [name] Riley        Date: 10/27/2021 DOB: 06-30-1951  Age: 71 y.o. MRN#: [id number] Attending Physician: Meredith Staggers, MD Primary Care Physician: [name], MD Admit Date: 10/20/2021   Medical records reviewed  Per intake H&P --> [name] is a 71 year old female with history of Left breast cancer 08/2011 with recurrence and mets to lungs, right kidney, R-sternum/scapula/ribs/spine/R-femur/skull as well as right frontal,  parietal and temporal lobe with edema and new onset seizures with breakthrough seizures and significant decline. Has been in CIR for rehabilitation but is not making great progress. T  The Palliative care team was asked to get involved to further discuss goals of care in the setting of metastatic disease - not progressing in rehabilitation.  Initial palliative consult completed on 10/26/2021    This NP visited patient at the bedside as a follow up to  yesterday's [place].  Husband and son at the bedside.   Patient is alert and participates minimally in today's conversation  Continued conversation regarding current medical situation.  Family members verbalize an understanding of the seriousness of the patient's current medical situation and limited prognosis secondary to her metastatic breast cancer.    Dr. Sonny Dandy spoke with patient and her family last night and recommended hospice care.  Patient and her family have accepted and wish to proceed with a discharge home with hospice.  Ultimately patient wishes to be near her family and to focus on comfort and dignity, understanding the time is limited.  Education offered on hospice benefit; philosophy, eligibility and services.  Education offered on the natural trajectory and expectations at end-of-life.  Plan of care -DNR/DNI -No artificial  feeding or hydration now or in the future -Avoid rehospitalization -Symptom management to enhance comfort - Discharge home with hospice, and transition to residential hospice if the need arises in the future.   Questions and concerns addressed   Discussed with Dr Sonny Dandy and attending   Wadie Lessen NP  Palliative Medicine Team Team Phone # [phone] Pager [phone]

## 2021-10-28 MED ORDER — METOPROLOL TARTRATE 25 MG PO TABS: 12.5000 mg | ORAL_TABLET | Freq: Two times a day (BID) | ORAL | 0 refills | Status: AC

## 2021-10-28 MED ORDER — POLYETHYLENE GLYCOL 3350 17 G PO PACK: 17.0000 g | PACK | Freq: Every day | ORAL | 0 refills | Status: AC | PRN

## 2021-10-28 MED ORDER — OXYCODONE-ACETAMINOPHEN 5-325 MG PO TABS: 1.0000 | ORAL_TABLET | ORAL | 0 refills | Status: AC | PRN

## 2021-10-28 MED ORDER — LACOSAMIDE 100 MG PO TABS: 100.0000 mg | ORAL_TABLET | Freq: Two times a day (BID) | ORAL | 0 refills | Status: AC

## 2021-10-28 MED ORDER — LEVETIRACETAM 1000 MG PO TABS: 2000.0000 mg | ORAL_TABLET | Freq: Two times a day (BID) | ORAL | 0 refills | Status: DC

## 2021-10-28 MED ORDER — LACTULOSE 10 GM/15ML PO SOLN: 30.0000 g | Freq: Three times a day (TID) | ORAL | 0 refills | Status: AC

## 2021-10-28 MED ORDER — HEPARIN SOD (PORK) LOCK FLUSH 100 UNIT/ML IV SOLN
500.0000 [IU] | INTRAVENOUS | Status: AC | PRN
  Administered 2021-10-28: 500 [IU]
  Filled 2021-10-28: qty 5

## 2021-10-28 MED ORDER — SULFAMETHOXAZOLE-TRIMETHOPRIM 800-160 MG PO TABS: 1.0000 | ORAL_TABLET | Freq: Two times a day (BID) | ORAL | 0 refills | Status: AC

## 2021-10-28 MED ORDER — DEXAMETHASONE 4 MG PO TABS: 4.0000 mg | ORAL_TABLET | Freq: Three times a day (TID) | ORAL | 0 refills | Status: AC

## 2021-10-28 NOTE — Progress Notes (Signed)
Physical Therapy Discharge Summary  Patient Details  Name: [name] Riley MRN: [id number] Date of Birth: [birth year]  Today's Date: 10/28/2021    Patient has met 0 of 8 long term goals due to lack of progress due to increasing severity of CA and reduced function.  Patient to discharge at a wheelchair level Total Assist.   Patient's care partner requires assistance to provide the necessary physical and cognitive assistance at discharge which change in d/c disposition   to Home with Hospice care. .  Reasons goals not met: Change in d/c plan to Hospice care  Recommendation:  Patient will benefit from Hospice comfort care   Equipment: No equipment provided by rehab services  Reasons for discharge: discharge from hospital  Patient/family agrees with progress made and goals achieved: Yes  PT Discharge Precautions/Restrictions Precautions Precautions: Fall Precaution Comments: L hemiplegia Vital Signs Therapy Vitals Pulse Rate: 65 BP: 136/70 Pain Pain Assessment Pain Scale: 0-10 Pain Score: 6  Pain Type: Acute pain;Chronic pain Pain Location: Generalized Pain Descriptors / Indicators: Aching Pain Frequency: Constant Pain Onset: On-going Patients Stated Pain Goal: 4 Pain Intervention(s): Medication (See eMAR) Pain Interference Pain Interference Pain Effect on Sleep: 2. Occasionally Pain Interference with Therapy Activities: 3. Frequently Pain Interference with Day-to-Day Activities: 3. Frequently Vision/Perception  Vision - History Ability to See in Adequate Light: 1 Impaired Perception Inattention/Neglect: Other (comment) Praxis Praxis: Impaired Praxis Impairment Details: Motor planning  Cognition Overall Cognitive Status: Impaired/Different from baseline Arousal/Alertness: Lethargic Year: 2021 Month: January Day of Week: Incorrect Attention: Sustained Sustained Attention: Impaired Sustained Attention Impairment: Verbal basic;Functional basic Memory:  Impaired Memory Impairment: Storage deficit;Decreased recall of new information;Decreased short term memory Immediate Memory Recall: Sock;Blue;Bed Memory Recall Sock: Without Cue Memory Recall Blue: Without Cue Memory Recall Bed: With Cue Awareness: Impaired Awareness Impairment: Intellectual impairment Safety/Judgment: Impaired Sensation Sensation Light Touch: Impaired by gross assessment Coordination Gross Motor Movements are Fluid and Coordinated: No Fine Motor Movements are Fluid and Coordinated: No Coordination and Movement Description: dense L hemiplegia Motor  Motor Motor: Hemiplegia Motor - Discharge Observations: L hemi  Mobility Bed Mobility Supine to Sit: Total Assistance - Patient < 25% Sit to Supine: Total Assistance - Patient < 25% Locomotion  Gait Ambulation: No Gait Gait: No Wheelchair Mobility Wheelchair Mobility: Yes Wheelchair Assistance: Dependent - Patient 0%  Trunk/Postural Assessment  Cervical Assessment Cervical Assessment:  (forward head) Thoracic Assessment Thoracic Assessment:  (rounded shoulders) Lumbar Assessment Lumbar Assessment:  (posterior pelvic tilt) Postural Control Postural Control: Deficits on evaluation (delayed and inadequate)  Balance Balance Balance Assessed: Yes Static Sitting Balance Static Sitting - Level of Assistance: 3: Mod assist Dynamic Sitting Balance Dynamic Sitting - Level of Assistance: 3: Mod assist;2: Max assist Static Standing Balance Static Standing - Level of Assistance: 1: +1 Total assist Extremity Assessment      RLE Assessment General Strength Comments: Grossly 4/5 LLE Assessment General Strength Comments: Grossly 2+/5. Weaker proximally than distally.    [name] 10/28/2021, 12:37 PM

## 2021-10-28 NOTE — Progress Notes (Signed)
Patient discharged to home, transported by [facility]. Husband notified of this.

## 2021-10-28 NOTE — Progress Notes (Signed)
PROGRESS NOTE   Subjective/Complaints:  Pt appears comfortable this morning. Aware of discharge today  ROS: Limited due to cognitive/behavioral   Objective:   No results found. No results for input(s): WBC, HGB, HCT, PLT in the last 72 hours.   Recent Labs    10/27/21 0409  NA 132*  K 4.3  CL 99  CO2 25  GLUCOSE 111*  BUN 20  CREATININE 0.64  CALCIUM 8.5*     Intake/Output Summary (Last 24 hours) at 10/28/2021 1106 Last data filed at 10/28/2021 0934 Gross per 24 hour  Intake 363 ml  Output 950 ml  Net -587 ml        Physical Exam: Vital Signs Blood pressure 136/70, pulse 65, temperature 97.9 F (36.6 C), resp. rate 17, height 5\' 8"  (1.727 m), weight 63.2 kg, SpO2 98 %.     Constitutional: No distress . Vital signs reviewed. HEENT: NCAT, EOMI, oral membranes moist Neck: supple Cardiovascular: RRR without murmur. No JVD    Respiratory/Chest: CTA Bilaterally without wheezes or rales. Normal effort    GI/Abdomen: BS +, non-tender, non-distended Ext: no clubbing, cyanosis, or edema Psych: pleasant and cooperative    Skin: ecchymoses and skin tears/wounds bilateral shins, stasis changes are stable to improved.  A few other scattered bruises--stable Neuro:  more alert. Significant right gaze preference but can attend to left.  Follows commands, stm deficits, limited insight.    No focal CN findings. RUE 4/5. LUE 0-tr/5 prox to distal. RLE 4-5/5. LLE still 1/5--not much motor change.  Musculoskeletal: left hip with mild tenderness in ROM  Assessment/Plan: 1. Functional deficits which require 3+ hours per day of interdisciplinary therapy in a comprehensive inpatient rehab setting. Physiatrist is providing close team supervision and 24 hour management of active medical problems listed below. Physiatrist and rehab team continue to assess barriers to discharge/monitor patient progress toward functional and  medical goals  Care Tool:  Bathing  Bathing activity did not occur: Safety/medical concerns Body parts bathed by patient: Chest, Abdomen, Face   Body parts bathed by helper: Right arm, Left arm, Front perineal area, Buttocks, Right upper leg, Left upper leg, Left lower leg, Right lower leg     Bathing assist Assist Level: Total Assistance - Patient < 25%     Upper Body Dressing/Undressing Upper body dressing   What is the patient wearing?: Pull over shirt    Upper body assist Assist Level: Maximal Assistance - Patient 25 - 49%    Lower Body Dressing/Undressing Lower body dressing      What is the patient wearing?: Incontinence brief, Pants     Lower body assist Assist for lower body dressing: 2 Helpers     Toileting Toileting Toileting Activity did not occur (Clothing management and hygiene only): N/A (no void or bm)  Toileting assist Assist for toileting: Dependent - Patient 0%     Transfers Chair/bed transfer  Transfers assist     Chair/bed transfer assist level: Total Assistance - Patient < 25%     Locomotion Ambulation   Ambulation assist   Ambulation activity did not occur: Safety/medical concerns          Walk 10 feet  activity   Assist  Walk 10 feet activity did not occur: Safety/medical concerns        Walk 50 feet activity   Assist Walk 50 feet with 2 turns activity did not occur: Safety/medical concerns         Walk 150 feet activity   Assist Walk 150 feet activity did not occur: Safety/medical concerns         Walk 10 feet on uneven surface  activity   Assist Walk 10 feet on uneven surfaces activity did not occur: Safety/medical concerns         Wheelchair     Assist Is the patient using a wheelchair?: Yes Type of Wheelchair: Manual    Wheelchair assist level: Dependent - Patient 0% Max wheelchair distance: 150'    Wheelchair 50 feet with 2 turns activity    Assist        Assist Level:  Dependent - Patient 0%   Wheelchair 150 feet activity     Assist      Assist Level: Dependent - Patient 0%   Blood pressure 136/70, pulse 65, temperature 97.9 F (36.6 C), resp. rate 17, height 5\' 8"  (1.727 m), weight 63.2 kg, SpO2 98 %.  Medical Problem List and Plan: 1. Functional deficits secondary to metastatic breast cancer. Pt with increased left hemiparesis d/t advancing metastatic disease vs post-ictal state             1/12 appreciate efforts of SW/palliative care. Pt to dc home today with home hospice.  2.  Antithrombotics: -DVT/anticoagulation:  Mechanical: Sequential compression devices, below knee Bilateral lower extremities             -antiplatelet therapy: N/A 3. Pain Management: Oxycodone prn.  4. Mood: LCSW to follow for evaluation and support.              -antipsychotic agents: N/a 5. Neuropsych: This patient is capable of making decisions on her own behalf. 6. Skin/Wound Care: routine pressure relief measures.              --continue air mattress overlay to prevent breakdown. Prevalon boots for sore heels.              -continue xeroform, 4x4, kerlix to areas for protection. Appreciate WOC RN note     1/6 above areas clean, healing  1/12 continue keep areas clean 7. Fluids/Electrolytes/Nutrition: Monitor I/O. Check lytes in am.  8. Focal status epilepticus:  Continue Keppra 2000 mg BID and Vimpat 50 mg BID.   -1/12 continue seizure meds for quality of life 9. Hepatic cirrhosis:                1/11 ammonia and LFT's continue climb despite increased lactulose. Will not pursue further labs or treatment given hospice plan 10. Breast cancer with extensive bony mets:discussed benefits of prehab for response to chemotherapy.   -multiple mets on imaging of pelvis 11. Cerebral mets: Continue decadron 4 mg daily.  12. Thrombocytopenia: Plts 92-->59-->51.    --1/9 plts up to 65k 13. HTN: monitor BP TID--continue lasix and metoprolol.               --metoprolol  decreased 01/04 due to hypotension.   1/9 continue regimen 14. HLD: LDL 129 on 04/24/19: statin not recommended given elevated FLTs.  15. Urine retention:  1/6-u/a equivocal, check ucx   -oob to empty   -begin urecholine trial  1/10 100k e coli,   sensitive to sulfa--began bactrim 1/9 -cont  rx for 7days 16. Constipation  1/9 miralax --changed back to prn    -lactulose as above  -moving bowels regularly      At least 35 total minutes were spent in examination of patient, assessment of pertinent data,  formulation of a treatment plan, and in discussion with patient and/or family.     LOS: 8 days A FACE TO FACE EVALUATION WAS PERFORMED  Meredith Staggers 10/28/2021, 11:06 AM

## 2021-10-28 NOTE — Progress Notes (Signed)
Patient ID: [name] Riley, female   DOB: 17-Feb-1951, 71 y.o.   MRN: [id number]  SW confirmed with pt son Mitzi Hansen all DME delivered to the home.  SW confirmed with Cassandra/Intake with [facility] [phone] that all DME received, and any pain medications can be sent to [facility] if any prescribed. They will pull d/c instructions from Epic.   Loralee Pacas, MSW, [place] Office: [phone] Cell: [phone] Fax: [phone]

## 2021-10-28 NOTE — Progress Notes (Signed)
Inpatient Rehabilitation Discharge Medication Review by a Pharmacist  A complete drug regimen review was completed for this patient to identify any potential clinically significant medication issues.  High Risk Drug Classes Is patient taking? Indication by Medication  Antipsychotic No   Anticoagulant No   Antibiotic Yes Bactrim for Ecoli UTI  Opioid No   Antiplatelet No   Hypoglycemics/insulin No   Vasoactive Medication Yes Lasix and metoprolol for HF and HTN  Chemotherapy No   Other Yes Steroids for cancer mets to the brain. Vimpat, Keppra for seizures      Clinically significant medication issues were identified that warrant physician communication and completion of prescribed/recommended actions by midnight of the next day:  No  Time spent performing this drug regimen review (minutes):  10 min  Asha Grumbine S. Alford Highland, PharmD, BCPS Clinical Staff Pharmacist [email or web address] [place] 10/28/2021 11:03 AM

## 2021-10-28 NOTE — Plan of Care (Signed)
Problem: RH Balance Goal: LTG Patient will maintain dynamic sitting balance (PT) Description: LTG:  Patient will maintain dynamic sitting balance with assistance during mobility activities (PT) Outcome: Not Met (add Reason) Goal: LTG Patient will maintain dynamic standing balance (PT) Description: LTG:  Patient will maintain dynamic standing balance with assistance during mobility activities (PT) Outcome: Not Met (add Reason)   Problem: Sit to Stand Goal: LTG:  Patient will perform sit to stand with assistance level (PT) Description: LTG:  Patient will perform sit to stand with assistance level (PT) Outcome: Not Met (add Reason)   Problem: RH Bed Mobility Goal: LTG Patient will perform bed mobility with assist (PT) Description: LTG: Patient will perform bed mobility with assistance, with/without cues (PT). Outcome: Not Met (add Reason)   Problem: RH Bed to Chair Transfers Goal: LTG Patient will perform bed/chair transfers w/assist (PT) Description: LTG: Patient will perform bed to chair transfers with assistance (PT). Outcome: Not Met (add Reason)   Problem: RH Car Transfers Goal: LTG Patient will perform car transfers with assist (PT) Description: LTG: Patient will perform car transfers with assistance (PT). Outcome: Not Met (add Reason)   Problem: RH Wheelchair Mobility Goal: LTG Patient will propel w/c in controlled environment (PT) Description: LTG: Patient will propel wheelchair in controlled environment, # of feet with assist (PT) Outcome: Not Met (add Reason) Goal: LTG Patient will propel w/c in home environment (PT) Description: LTG: Patient will propel wheelchair in home environment, # of feet with assistance (PT). Outcome: Not Met (add Reason)

## 2021-10-29 LAB — URINE CULTURE: Culture: >=100000 — AB

## 2021-10-29 NOTE — Progress Notes (Signed)
Inpatient Rehabilitation Care Coordinator Discharge Note   Patient Details  Name: [name] Riley MRN: [id number] Date of Birth: 01-Jul-1951   Discharge location: D/c to home with hospice  Length of Stay: 8 days  Discharge activity level: wheelchair level Total Assist  Home/community participation: Limited  Patient response [id number] Literacy - How often do you need to have someone help you when you read instructions, pamphlets, or other written material from your doctor or pharmacy?: Never  Patient response IB:[id number] Isolation - How often do you feel lonely or isolated from those around you?: Never  Services provided included: MD, RD, PT, OT, SLP, RN, Pharmacy, Neuropsych, SW, TR, CM  Financial Services:  Charity fundraiser Utilized: Medicare    Choices offered to/list presented to: Yes  Follow-up services arranged:  Other (Comment) (Home with [facility])  Patient response to transportation need: Is the patient able to respond to transportation needs?: Yes In the past 12 months, has lack of transportation kept you from medical appointments or from getting medications?: No In the past 12 months, has lack of transportation kept you from meetings, work, or from getting things needed for daily living?: No  Comments (or additional information):  Patient/Family verbalized understanding of follow-up arrangements:  Yes  Individual responsible for coordination of the follow-up plan: contact pt husband [name] Riley [phone]  Confirmed correct DME delivered: Rana Snare 10/29/2021    Rana Snare

## 2021-11-01 ENCOUNTER — Other Ambulatory Visit: Payer: Self-pay | Admitting: Family Medicine

## 2021-11-01 ENCOUNTER — Telehealth: Payer: Self-pay | Admitting: Family Medicine

## 2021-11-01 MED ORDER — LEVETIRACETAM 100 MG/ML PO SOLN: 2000.0000 mg | Freq: Two times a day (BID) | ORAL | 2 refills | Status: AC

## 2021-11-01 NOTE — Telephone Encounter (Signed)
Kim with hospice is aware

## 2021-11-01 NOTE — Telephone Encounter (Signed)
Kim called from Hospice stating that pt takes KEPPRA and is having a hard time swallowing. Says pts family is requesting that pts PCP switch her to taking liquid KEPPRA instead of pill form.   Needs liquid Keppra Rx sent to [facility].

## 2021-11-01 NOTE — Telephone Encounter (Signed)
Please let the patient know that I sent their prescription to their pharmacy. Thanks, WS 

## 2021-11-01 NOTE — Discharge Summary (Signed)
Physician Discharge Summary  Patient ID: [name] Riley MRN: [id number] DOB/AGE: [birth year] 71 y.o.  Admit date: 10/20/2021 Discharge date: 10/28/2021  Discharge Diagnoses:  Principal Problem:   Breast cancer metastasized to brain [facility]) Active Problems:   E. coli UTI   Thrombocytopenia ([place])   Radiation therapy induced brain necrosis   Seizures ([facility])   Vasogenic brain edema (H[facility])   Weakness of left upper extremity   Discharged Condition: poor  Significant Diagnostic Studies: MR BRAIN WO CONTRAST  Result Date: 10/25/2021 CLINICAL DATA:  Neuro deficit, acute, stroke suspected EXAM: MRI HEAD WITHOUT CONTRAST TECHNIQUE: Multiplanar, multiecho pulse sequences of the brain and surrounding structures were obtained without intravenous contrast. COMPARISON:  10/14/2021. FINDINGS: Brain: In comparison to recent prior from 10/14/2021, similar versus mildly progressed extensive vasogenic edema in the high right frontal lobe and portions of the parietal lobe. Mass effect is similar with sulcal effacement and mild effacement of the right lateral ventricle. No significant midline shift. Cortical areas of susceptibility artifact are again noted along the right frontal and parietal convexity, grossly similar. Similar associated artifactual mild DWI hyperintensity (T2 shine through). No evidence of acute infarct. Similar focus of T2/FLAIR hyperintensity at the right temporal tip. No hydrocephalus. No acute hemorrhage. Vascular: Major arterial flow voids are maintained skull base. Skull and upper cervical spine: Redemonstrated extensive osseous metastatic disease. Sinuses/Orbits: Clear sinuses.  Unremarkable orbits. Other: No mastoid effusions IMPRESSION: 1. In comparison to recent prior from 10/14/2021, similar versus mildly progressed extensive vasogenic edema in the high right frontoparietal region which was discussed on the priors. 2. Similar noncontrast appearance of the areas of susceptibility artifact in the  right frontal and parietal lobes, better characterized on prior post-contrast MRI. Postcontrast imaging could better assess for interval change if clinically indicated. 3. Stable focal T2 signal at the right temporal tip. 4. No evidence of acute infarct. Electronically Signed   By: Margaretha Sheffield M.D.   On: 10/25/2021 15:57    CT HIP LEFT WO CONTRAST  Result Date: 10/20/2021 CLINICAL DATA:  Possible left acetabular fracture. History of breast cancer. EXAM: CT OF THE LEFT HIP WITHOUT CONTRAST TECHNIQUE: Multidetector CT imaging of the left hip was performed according to the standard protocol. Multiplanar CT image reconstructions were also generated. COMPARISON:  Left hip x-rays from same day. CT chest, abdomen, and pelvis dated September 15, 2021. FINDINGS: Bones/Joint/Cartilage No acute fracture or dislocation. 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 Widespread sclerotic osseous metastases, not significantly changed. Unchanged mild to moderate left hip joint space narrowing with marginal osteophytes. No joint effusion. Ligaments Ligaments are suboptimally evaluated by CT. Muscles and Tendons Grossly intact.  Gluteus minimus muscle atrophy. Soft tissue No fluid collection or hematoma.  No soft tissue mass. IMPRESSION: 1. No acute osseous abnormality. 2. 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 3. Widespread sclerotic osseous metastases, not significantly changed. Electronically Signed   By: Titus Dubin M.D.   On: 10/20/2021 14:32    Overnight EEG with video  Result Date: 10/26/2021 Lora Havens, MD     10/26/2021 12:34 PM Patient Name: [name] Riley MRN: [id number] Epilepsy Attending: Lora Havens Referring Physician/Provider: Dr Kerney Elbe Duration: 10/25/2020 1806 to 10/26/2020 1211  Patient history: 71 year old  female with metastatic brain disease presented with seizure. EEG to evaluate for seizure.  Level of alertness: Awake, asleep  AEDs during EEG  study: LEV  Technical aspects: This EEG study was done with scalp electrodes positioned according to the 10-20 International system of electrode placement. Electrical activity was acquired at a sampling rate of 500Hz  and reviewed with a high frequency filter of 70Hz  and a low frequency filter of 1Hz . EEG data were recorded continuously and digitally stored.  Description: The posterior dominant rhythm consists of 9 Hz activity of moderate voltage (25-35 uV) seen predominantly in posterior head regions, symmetric and reactive to eye opening and eye closing.  Sleep was characterized by vertex waves, sleep spindles (12 to 14 Hz), maximum frontocentral region.  EEG showed continuous 5 to 6 Hz theta slowing in right hemisphere, maximal right temporo-parietal region. Spikes were also noted in right temporo-parietal region, which at times appear periodic at 1hz  and rhythmic without definite evolution. Hyperventilation and photic stimulation were not performed.     ABNORMALITY - Lateralized periodic discharges, right hemisphere, right temporo-parietal region (LPD+R) - Continuous slow, right hemisphere, maximal right temporo-parietal region  IMPRESSION: This study showed epileptogenicity and cortical dysfunction arising from right hemisphere, right temporo-parietal region likely secondary to underlying mass. The lateralized periodic discharges were at times rhythmic which is on the ictal-interictal region with high potential for seizure recurrence. No definite seizures were seen during this study.  Lora Havens  DG HIP UNILAT WITH PELVIS 2-3 VIEWS RIGHT  Result Date: 10/20/2021 CLINICAL DATA:  Bilateral hip pain. History of metastatic breast cancer. EXAM: DG HIP (WITH OR WITHOUT PELVIS) 2-3V RIGHT COMPARISON:  None. FINDINGS: There is no evidence of hip fracture or dislocation.  Sclerotic densities are seen throughout the pelvis and visualized proximal femur and sacrum consistent with osseous metastatic disease. Moderate degenerative joint disease is seen involving both hips. IMPRESSION: Diffuse osseous metastatic disease as described above. 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 No fractures noted. Electronically Signed   By: Marijo Conception M.D.   On: 10/20/2021 11:47    Labs:  Basic Metabolic Panel: BMP Latest Ref Rng & Units 10/27/2021 10/25/2021 10/21/2021  Glucose 70 - 99 mg/dL 111(H) 80 82  BUN 8 - 23 mg/dL 20 21 20   Creatinine 0.44 - 1.00 mg/dL 0.64 0.61 0.60  BUN/Creat Ratio 12 - 28 - - -  Sodium 135 - 145 mmol/L 132(L) 134(L) 136  Potassium 3.5 - 5.1 mmol/L 4.3 3.8 3.9  Chloride 98 - 111 mmol/L 99 98 103  CO2 22 - 32 mmol/L 25 28 27   Calcium 8.9 - 10.3 mg/dL 8.5(L) 8.2(L) 8.2(L)     CBC: CBC Latest Ref Rng & Units 10/25/2021 10/21/2021 10/18/2021  WBC 4.0 - 10.5 K/uL 7.8 6.6 5.6  Hemoglobin 12.0 - 15.0 g/dL 11.5(L) 11.7(L) 11.4(L)  Hematocrit 36.0 - 46.0 % 33.1(L) 34.5(L) 33.9(L)  Platelets 150 - 400 K/uL 65(L) 57(L) 51(L)     CBG: No results for input(s): GLUCAP in the last 168 hours.  Brief HPI:   KENLYN LOSE is a 71 y.o. female with history of breast cancer with recurrence and mets to lungs, right kidney, right sternum/scapula/rib/spine/right femur/skull as well as right frontal, parietal and temporal lobe with edema, and new onset seizures with significant recent decline.  She was admitted on 09/2619 per Dr. Renda Rolls input for assessment and work-up of increasing left-sided weakness with hallucination and twitching of left hand.  She was placed on LTM-EEG due to concerns of refractory postictal Todd's paralysis and was found to have 1 seizure without clinical signs as well as LUE twitching due to focal  convulsive status epilepticus with encephalopathy.  She was started on Vimpat for breakthrough seizures.   Follow-up MRI brain done  showing increasing lesions in the right frontal lobe and as well as increasing cerebral edema.  She was noted to have elevated liver enzymes with distended abdomen as well as BLE edema.  Liver ultrasound showed increase in size of liver mass and diffuse hepatocellular disease with concerns of cirrhosis versus worsening of metastases.  Ammonia levels were noted to be elevated therefore lactulose was added.  Seizure had resolved with improvement in mentation and resolution of confusion.  She continues to be limited by left greater than right-sided weakness with fatigue, weakness as well as standing attempts with LLE block.  She continued to have left greater than right-sided weakness.  CIR was recommended due to functional decline.   Hospital Course: AMOREE NEWLON was admitted to rehab 10/20/2021 for inpatient therapies to consist of PT, ST and OT at least three hours five days a week. Past admission physiatrist, therapy team and rehab RN have worked together to provide customized collaborative inpatient rehab. His blood pressures were monitored on TID basis and metoprolol was decreased due to hypotension.  Ammonia levels were repeated and noted to be elevated therefore lactulose was increased but LFTs as well as ammonia levels continue to rise.  MiraLAX was changed to as needed.  She was found to have urinary retention and urine culture showed evidence of E. coli UTI.  She was started on Bactrim x7 days for treatment.  She required in and out catheterization which would painful and elected on Foley for for comfort.    Air mattress overlay has been used for pressure-relief measures and prolonged boots were used for pressure relief measures.   BLE wounds were managed with use of Xeroform gauze, 4 x 4 and Kerlix for protection.  On 01/09 she was found to have increase in left upper extremity weakness and neurology was consulted for input due to concerns of breakthrough seizures.  She was placed on long-term EEG  which showed concerns of rhythmicity in right hemisphere indicating potentially ictal-interictal activity therefore she was loaded with Vimpat and Vimpat was increased 200 mg twice daily.  LTM-EEG was discontinued on 01/10 as no definite seizures noted overnight.  MRI brain done showing some worsening in cerebral edema and Decadron was increased to 4 mg 3 times daily per discussion with Dr. Mickeal Skinner.    Dr. Lindi Adie was also consulted for input and felt that patient's brain metastases was progressively getting worse with significant decline in performance status and he recommended now no further aggressive treatment.  He felt that her life expectancy would be very sought probably less than a month and strongly supported moving into hospice care.  Patient continued to have significant fatigue affecting participation in rehab.  Palliative care was consulted to help discuss goals of care and patient/family elected on discharge to home with hospice.   Rehab course: During patient's stay in rehab weekly team conferences were held to monitor patient's progress, set goals and discuss barriers to discharge. At admission, patient required max assist with mobility and total assist with ADL tasks. She exhibited moderate cognitive impairments with poor awareness of deficits.  She scored 14/30 on SLUMS score. She had a lack of progress during his stay due to severity of cancer with progressive symptoms.  She was discharged to home at total assist at wheelchair level.  Discharge disposition: Home   Diet: Soft    Discharge Instructions  Discharge patient   Complete by: As directed    Discharge disposition: 51-Hospice/Medical Facility   Discharge patient date: 10/28/2021      Allergies as of 10/28/2021       Reactions   Cantaloupe Extract Allergy Skin Test Shortness Of Breath   Contrast Media [iodinated Contrast Media] Shortness Of Breath, Rash   Pravastatin Other (See Comments)   Legs hurt   Zosyn  [piperacillin Sod-tazobactam So] Rash, Other (See Comments)   Temperature increase, facial flushing   Latex Rash   Redness, itch        Medication List     STOP taking these medications    levETIRAcetam 1000 MG tablet Commonly known as: KEPPRA   LORazepam 1 MG tablet Commonly known as: Ativan   Nayzilam 5 MG/0.1ML Soln Generic drug: Midazolam   ondansetron 8 MG tablet Commonly known as: Zofran   prochlorperazine 10 MG tablet Commonly known as: COMPAZINE       TAKE these medications    acetaminophen 325 MG tablet Commonly known as: TYLENOL Take 1-2 tablets (325-650 mg total) by mouth every 4 (four) hours as needed for mild pain.   bisacodyl 5 MG EC tablet Commonly known as: DULCOLAX Take 1 tablet (5 mg total) by mouth daily as needed for moderate constipation.   dexamethasone 4 MG tablet Commonly known as: DECADRON Take 1 tablet (4 mg total) by mouth every 8 (eight) hours. What changed:  when to take this additional instructions   diphenhydrAMINE 25 MG tablet Commonly known as: BENADRYL Take 25 mg by mouth at bedtime as needed for sleep.   furosemide 20 MG tablet Commonly known as: LASIX TAKE ONE (1) TABLET BY MOUTH EVERY DAY What changed: how much to take   Lacosamide 100 MG Tabs Take 1 tablet (100 mg total) by mouth every 12 (twelve) hours. What changed:  medication strength how much to take   lactulose 10 GM/15ML solution Commonly known as: CHRONULAC Take 45 mLs (30 g total) by mouth 3 (three) times daily. What changed:  how much to take when to take this   levothyroxine 100 MCG tablet Commonly known as: SYNTHROID TAKE ONE TABLET EACH MORNING BEFORE BREAKFAST What changed: See the new instructions.   lidocaine-prilocaine cream Commonly known as: EMLA Apply 1 application topically daily as needed (port access).   metoprolol tartrate 25 MG tablet Commonly known as: LOPRESSOR Take 0.5 tablets (12.5 mg total) by mouth 2 (two) times  daily.   multivitamin with minerals tablet Take 1 tablet by mouth daily.   oxyCODONE-acetaminophen 5-325 MG tablet Commonly known as: PERCOCET/ROXICET Take 1 tablet by mouth every 4 (four) hours as needed for moderate pain. What changed: when to take this   pantoprazole 40 MG tablet Commonly known as: Protonix Take 1 tablet (40 mg total) by mouth daily.   polyethylene glycol 17 g packet Commonly known as: MIRALAX / GLYCOLAX Take 17 g by mouth daily as needed for moderate constipation.   sulfamethoxazole-trimethoprim 800-160 MG tablet Commonly known as: BACTRIM DS Take 1 tablet by mouth every 12 (twelve) hours.   traZODone 50 MG tablet Commonly known as: DESYREL Take 1 tablet (50 mg total) by mouth at bedtime. What changed:  when to take this reasons to take this        Follow-up Information     Meredith Staggers, MD Follow up.   Specialty: Physical Medicine and Rehabilitation Why: As needed Contact information: [address] [place] [address] [phone]  Ventura Sellers, MD Follow up.   Specialties: Psychiatry, Neurology, Oncology Why: call for follow up appointment Contact information: [place] [address] [phone]         [name], MD Follow up.   Specialty: Family Medicine Why: call for follow up appointment Contact information: [place] [address] [phone]         Minus Breeding, MD .   Specialty: Cardiology Contact information: [address] [place] [address] [phone]                 Signed: Bary Leriche 11/01/2021, 5:45 PM

## 2021-11-03 ENCOUNTER — Telehealth: Payer: Self-pay | Admitting: *Deleted

## 2021-11-03 NOTE — Telephone Encounter (Signed)
Per plan no authorization needed.

## 2021-11-05 ENCOUNTER — Telehealth: Payer: Self-pay | Admitting: *Deleted

## 2021-11-05 MED ORDER — LORAZEPAM 1 MG PO TABS: 1.0000 mg | ORAL_TABLET | Freq: Every day | ORAL | 0 refills | Status: AC

## 2021-11-05 NOTE — Telephone Encounter (Signed)
Ativan Prescription sent to pharmacy given patient is on hospice.   Evelina Dun, FNP

## 2021-11-05 NOTE — Addendum Note (Signed)
Addended by: Evelina Dun A on: 11/05/2021 03:39 PM   Modules accepted: Orders

## 2021-11-05 NOTE — Telephone Encounter (Signed)
TC w/ Kim from [facility] Husband called her this morning needing refill on pt's Lorazepam 1 mg QHS but she is not seeing this on pt's med list. Looked at pt's chart since we do not have it on her med list either. This was DC'd at hospital discharge on 10/28/21  Trazodone does not seem to be working, can Lorazepam be called into [facility] Please call Maudie Mercury back to let her know

## 2021-11-05 NOTE — Telephone Encounter (Signed)
Left detailed message.   

## 2021-11-05 NOTE — Telephone Encounter (Signed)
Patient will need to be seen by PCP or the PCP can decide what to do.

## 2021-11-10 ENCOUNTER — Telehealth: Payer: Self-pay

## 2021-11-11 ENCOUNTER — Ambulatory Visit: Payer: Medicare Other | Admitting: Internal Medicine

## 2021-11-16 ENCOUNTER — Inpatient Hospital Stay: Payer: Medicare Other | Admitting: Hematology and Oncology

## 2021-11-16 ENCOUNTER — Inpatient Hospital Stay: Payer: Medicare Other

## 2021-11-17 NOTE — Telephone Encounter (Signed)
Vernard Gambles Jastrzebski called to inform the clinic that his wife, [name] Riley,  died on November 29, 2021. He voiced appreciation for the care she received from Dr. Mickeal Skinner and Shelle Iron.

## 2021-11-17 DEATH — deceased

## 2021-12-07 ENCOUNTER — Ambulatory Visit: Payer: Medicare Other

## 2021-12-07 ENCOUNTER — Other Ambulatory Visit: Payer: Medicare Other

## 2021-12-07 ENCOUNTER — Ambulatory Visit: Payer: Medicare Other | Admitting: Hematology and Oncology

## 2021-12-20 ENCOUNTER — Other Ambulatory Visit (HOSPITAL_COMMUNITY): Payer: Self-pay

## 2021-12-28 ENCOUNTER — Ambulatory Visit: Payer: Medicare Other | Admitting: Hematology and Oncology

## 2021-12-28 ENCOUNTER — Ambulatory Visit: Payer: Medicare Other

## 2021-12-28 ENCOUNTER — Other Ambulatory Visit: Payer: Medicare Other

## 2022-02-10 ENCOUNTER — Ambulatory Visit: Payer: Medicare Other | Admitting: Family Medicine

## 2024-04-12 ENCOUNTER — Other Ambulatory Visit (HOSPITAL_COMMUNITY): Payer: Self-pay
